# Patient Record
Sex: Female | Born: 1965 | Race: White | Hispanic: No | Smoking: Never smoker
Health system: Southern US, Community
[De-identification: ages and names within clinical notes are randomized; demographics above are authoritative.]

## PROBLEM LIST (undated history)

## (undated) DIAGNOSIS — M545 Low back pain, unspecified: Secondary | ICD-10-CM

## (undated) DIAGNOSIS — G43909 Migraine, unspecified, not intractable, without status migrainosus: Secondary | ICD-10-CM

## (undated) DIAGNOSIS — G4733 Obstructive sleep apnea (adult) (pediatric): Secondary | ICD-10-CM

## (undated) DIAGNOSIS — M1990 Unspecified osteoarthritis, unspecified site: Secondary | ICD-10-CM

## (undated) DIAGNOSIS — T4145XA Adverse effect of unspecified anesthetic, initial encounter: Secondary | ICD-10-CM

## (undated) DIAGNOSIS — I350 Nonrheumatic aortic (valve) stenosis: Secondary | ICD-10-CM

## (undated) DIAGNOSIS — I10 Essential (primary) hypertension: Secondary | ICD-10-CM

## (undated) DIAGNOSIS — J45909 Unspecified asthma, uncomplicated: Secondary | ICD-10-CM

## (undated) DIAGNOSIS — G8929 Other chronic pain: Secondary | ICD-10-CM

## (undated) DIAGNOSIS — J189 Pneumonia, unspecified organism: Secondary | ICD-10-CM

## (undated) DIAGNOSIS — C539 Malignant neoplasm of cervix uteri, unspecified: Secondary | ICD-10-CM

## (undated) DIAGNOSIS — E039 Hypothyroidism, unspecified: Secondary | ICD-10-CM

## (undated) DIAGNOSIS — E662 Morbid (severe) obesity with alveolar hypoventilation: Secondary | ICD-10-CM

## (undated) DIAGNOSIS — T8859XA Other complications of anesthesia, initial encounter: Secondary | ICD-10-CM

## (undated) DIAGNOSIS — I2510 Atherosclerotic heart disease of native coronary artery without angina pectoris: Secondary | ICD-10-CM

## (undated) DIAGNOSIS — K589 Irritable bowel syndrome without diarrhea: Secondary | ICD-10-CM

## (undated) DIAGNOSIS — E785 Hyperlipidemia, unspecified: Secondary | ICD-10-CM

## (undated) DIAGNOSIS — R011 Cardiac murmur, unspecified: Secondary | ICD-10-CM

## (undated) DIAGNOSIS — B1920 Unspecified viral hepatitis C without hepatic coma: Secondary | ICD-10-CM

## (undated) DIAGNOSIS — Z8739 Personal history of other diseases of the musculoskeletal system and connective tissue: Secondary | ICD-10-CM

## (undated) DIAGNOSIS — E119 Type 2 diabetes mellitus without complications: Secondary | ICD-10-CM

## (undated) DIAGNOSIS — I5030 Unspecified diastolic (congestive) heart failure: Secondary | ICD-10-CM

## (undated) DIAGNOSIS — I509 Heart failure, unspecified: Secondary | ICD-10-CM

## (undated) DIAGNOSIS — Z9981 Dependence on supplemental oxygen: Secondary | ICD-10-CM

## (undated) DIAGNOSIS — J449 Chronic obstructive pulmonary disease, unspecified: Secondary | ICD-10-CM

## (undated) DIAGNOSIS — K219 Gastro-esophageal reflux disease without esophagitis: Secondary | ICD-10-CM

## (undated) HISTORY — DX: Nonrheumatic aortic (valve) stenosis: I35.0

## (undated) HISTORY — DX: Hypothyroidism, unspecified: E03.9

## (undated) HISTORY — PX: CHOLECYSTECTOMY OPEN: SUR202

## (undated) HISTORY — PX: ABDOMINAL SURGERY: SHX537

## (undated) HISTORY — DX: Chronic obstructive pulmonary disease, unspecified: J44.9

## (undated) HISTORY — DX: Hyperlipidemia, unspecified: E78.5

## (undated) HISTORY — DX: Unspecified diastolic (congestive) heart failure: I50.30

## (undated) HISTORY — DX: Essential (primary) hypertension: I10

## (undated) HISTORY — DX: Morbid (severe) obesity with alveolar hypoventilation: E66.2

## (undated) HISTORY — DX: Gastro-esophageal reflux disease without esophagitis: K21.9

## (undated) HISTORY — PX: TONSILLECTOMY: SUR1361

## (undated) HISTORY — DX: Malignant neoplasm of cervix uteri, unspecified: C53.9

---

## 2004-10-01 DIAGNOSIS — C539 Malignant neoplasm of cervix uteri, unspecified: Secondary | ICD-10-CM

## 2004-10-01 HISTORY — PX: TOTAL ABDOMINAL HYSTERECTOMY: SHX209

## 2004-10-01 HISTORY — DX: Malignant neoplasm of cervix uteri, unspecified: C53.9

## 2010-10-06 ENCOUNTER — Inpatient Hospital Stay
Admission: AD | Admit: 2010-10-06 | Discharge: 2010-11-06 | Disposition: A | Discharge status: Home / Self Care | Payer: Self-pay | Source: Other Acute Inpatient Hospital | Attending: Internal Medicine | Admitting: Internal Medicine

## 2010-10-06 ENCOUNTER — Encounter: Payer: Self-pay | Admitting: Cardiology

## 2010-10-06 ENCOUNTER — Encounter: Payer: Self-pay | Admitting: Pulmonary Disease

## 2010-10-09 ENCOUNTER — Encounter (INDEPENDENT_AMBULATORY_CARE_PROVIDER_SITE_OTHER): Payer: Self-pay | Admitting: Internal Medicine

## 2010-10-16 LAB — CULTURE, BLOOD (ROUTINE X 2)
Culture  Setup Time: 201201071423
Culture  Setup Time: 201201071423
Culture: NO GROWTH
Culture: NO GROWTH

## 2010-10-16 LAB — BLOOD GAS, ARTERIAL
Acid-Base Excess: 11.9 mmol/L — ABNORMAL HIGH (ref 0.0–2.0)
Acid-Base Excess: 10.9 mmol/L — ABNORMAL HIGH (ref 0.0–2.0)
Acid-Base Excess: 11.5 mmol/L — ABNORMAL HIGH (ref 0.0–2.0)
Acid-Base Excess: 11.1 mmol/L — ABNORMAL HIGH (ref 0.0–2.0)
Acid-Base Excess: 10.3 mmol/L — ABNORMAL HIGH (ref 0.0–2.0)
Bicarbonate: 37.1 mEq/L — ABNORMAL HIGH (ref 20.0–24.0)
Bicarbonate: 35.2 mEq/L — ABNORMAL HIGH (ref 20.0–24.0)
Bicarbonate: 36.6 mEq/L — ABNORMAL HIGH (ref 20.0–24.0)
Bicarbonate: 36.7 mEq/L — ABNORMAL HIGH (ref 20.0–24.0)
Bicarbonate: 35.6 mEq/L — ABNORMAL HIGH (ref 20.0–24.0)
Delivery systems: POSITIVE
Delivery systems: POSITIVE
Delivery systems: POSITIVE
Expiratory PAP: 15
Expiratory PAP: 14
Expiratory PAP: 16
FIO2: 1 %
FIO2: 0.8 %
FIO2: 100 %
FIO2: 0.8 %
FIO2: 0.6 %
Inspiratory PAP: 22
Inspiratory PAP: 20
Inspiratory PAP: 22
O2 Saturation: 95.3 %
O2 Saturation: 93.6 %
O2 Saturation: 93.7 %
O2 Saturation: 96.1 %
O2 Saturation: 94.2 %
Patient temperature: 98.6
Patient temperature: 98.6
Patient temperature: 98.6
Patient temperature: 98.6
Patient temperature: 99
RATE: 10 resp/min
RATE: 20 resp/min
TCO2: 39 mmol/L (ref 0–100)
TCO2: 36.7 mmol/L (ref 0–100)
TCO2: 38.3 mmol/L (ref 0–100)
TCO2: 38.7 mmol/L (ref 0–100)
TCO2: 37.5 mmol/L (ref 0–100)
pCO2 arterial: 60.7 mmHg (ref 35.0–45.0)
pCO2 arterial: 48.7 mmHg — ABNORMAL HIGH (ref 35.0–45.0)
pCO2 arterial: 57.9 mmHg (ref 35.0–45.0)
pCO2 arterial: 65.3 mmHg (ref 35.0–45.0)
pCO2 arterial: 61.3 mmHg (ref 35.0–45.0)
pH, Arterial: 7.403 — ABNORMAL HIGH (ref 7.350–7.400)
pH, Arterial: 7.472 — ABNORMAL HIGH (ref 7.350–7.400)
pH, Arterial: 7.417 — ABNORMAL HIGH (ref 7.350–7.400)
pH, Arterial: 7.368 (ref 7.350–7.400)
pH, Arterial: 7.383 (ref 7.350–7.400)
pO2, Arterial: 78.5 mmHg — ABNORMAL LOW (ref 80.0–100.0)
pO2, Arterial: 66.3 mmHg — ABNORMAL LOW (ref 80.0–100.0)
pO2, Arterial: 70.1 mmHg — ABNORMAL LOW (ref 80.0–100.0)
pO2, Arterial: 90 mmHg (ref 80.0–100.0)
pO2, Arterial: 77.8 mmHg — ABNORMAL LOW (ref 80.0–100.0)

## 2010-10-16 LAB — CBC
HCT: 33.5 % — ABNORMAL LOW (ref 36.0–46.0)
HCT: 35.2 % — ABNORMAL LOW (ref 36.0–46.0)
HCT: 36.2 % (ref 36.0–46.0)
HCT: 38.7 % (ref 36.0–46.0)
HCT: 40.6 % (ref 36.0–46.0)
HCT: 39.7 % (ref 36.0–46.0)
HCT: 39.3 % (ref 36.0–46.0)
HCT: 37.7 % (ref 36.0–46.0)
HCT: 38.4 % (ref 36.0–46.0)
Hemoglobin: 9.7 g/dL — ABNORMAL LOW (ref 12.0–15.0)
Hemoglobin: 10.6 g/dL — ABNORMAL LOW (ref 12.0–15.0)
Hemoglobin: 11 g/dL — ABNORMAL LOW (ref 12.0–15.0)
Hemoglobin: 11.8 g/dL — ABNORMAL LOW (ref 12.0–15.0)
Hemoglobin: 12.8 g/dL (ref 12.0–15.0)
Hemoglobin: 12.4 g/dL (ref 12.0–15.0)
Hemoglobin: 12.1 g/dL (ref 12.0–15.0)
Hemoglobin: 11.8 g/dL — ABNORMAL LOW (ref 12.0–15.0)
Hemoglobin: 12 g/dL (ref 12.0–15.0)
MCH: 28.6 pg (ref 26.0–34.0)
MCH: 28.3 pg (ref 26.0–34.0)
MCH: 29.1 pg (ref 26.0–34.0)
MCH: 29.1 pg (ref 26.0–34.0)
MCH: 29.7 pg (ref 26.0–34.0)
MCH: 29.2 pg (ref 26.0–34.0)
MCH: 29 pg (ref 26.0–34.0)
MCH: 29.4 pg (ref 26.0–34.0)
MCH: 29.3 pg (ref 26.0–34.0)
MCHC: 29 g/dL — ABNORMAL LOW (ref 30.0–36.0)
MCHC: 30.1 g/dL (ref 30.0–36.0)
MCHC: 30.4 g/dL (ref 30.0–36.0)
MCHC: 30.5 g/dL (ref 30.0–36.0)
MCHC: 31.5 g/dL (ref 30.0–36.0)
MCHC: 31.2 g/dL (ref 30.0–36.0)
MCHC: 30.8 g/dL (ref 30.0–36.0)
MCHC: 31.3 g/dL (ref 30.0–36.0)
MCHC: 31.3 g/dL (ref 30.0–36.0)
MCV: 98.8 fL (ref 78.0–100.0)
MCV: 94.1 fL (ref 78.0–100.0)
MCV: 95.8 fL (ref 78.0–100.0)
MCV: 95.6 fL (ref 78.0–100.0)
MCV: 94.2 fL (ref 78.0–100.0)
MCV: 93.4 fL (ref 78.0–100.0)
MCV: 94.2 fL (ref 78.0–100.0)
MCV: 93.8 fL (ref 78.0–100.0)
MCV: 93.9 fL (ref 78.0–100.0)
Platelets: 216 10*3/uL (ref 150–400)
Platelets: 256 10*3/uL (ref 150–400)
Platelets: 291 10*3/uL (ref 150–400)
Platelets: 334 10*3/uL (ref 150–400)
Platelets: 334 10*3/uL (ref 150–400)
Platelets: 362 10*3/uL (ref 150–400)
Platelets: 369 10*3/uL (ref 150–400)
Platelets: 359 10*3/uL (ref 150–400)
Platelets: 427 10*3/uL — ABNORMAL HIGH (ref 150–400)
RBC: 3.39 MIL/uL — ABNORMAL LOW (ref 3.87–5.11)
RBC: 3.74 MIL/uL — ABNORMAL LOW (ref 3.87–5.11)
RBC: 3.78 MIL/uL — ABNORMAL LOW (ref 3.87–5.11)
RBC: 4.05 MIL/uL (ref 3.87–5.11)
RBC: 4.31 MIL/uL (ref 3.87–5.11)
RBC: 4.25 MIL/uL (ref 3.87–5.11)
RBC: 4.17 MIL/uL (ref 3.87–5.11)
RBC: 4.02 MIL/uL (ref 3.87–5.11)
RBC: 4.09 MIL/uL (ref 3.87–5.11)
RDW: 13.7 % (ref 11.5–15.5)
RDW: 13.3 % (ref 11.5–15.5)
RDW: 13.7 % (ref 11.5–15.5)
RDW: 13.7 % (ref 11.5–15.5)
RDW: 13.5 % (ref 11.5–15.5)
RDW: 13.6 % (ref 11.5–15.5)
RDW: 13.8 % (ref 11.5–15.5)
RDW: 13.9 % (ref 11.5–15.5)
RDW: 14 % (ref 11.5–15.5)
WBC: 10.2 10*3/uL (ref 4.0–10.5)
WBC: 9.4 10*3/uL (ref 4.0–10.5)
WBC: 14.1 10*3/uL — ABNORMAL HIGH (ref 4.0–10.5)
WBC: 12.3 10*3/uL — ABNORMAL HIGH (ref 4.0–10.5)
WBC: 13.5 10*3/uL — ABNORMAL HIGH (ref 4.0–10.5)
WBC: 14.3 10*3/uL — ABNORMAL HIGH (ref 4.0–10.5)
WBC: 14.4 10*3/uL — ABNORMAL HIGH (ref 4.0–10.5)
WBC: 19.3 10*3/uL — ABNORMAL HIGH (ref 4.0–10.5)
WBC: 22.1 10*3/uL — ABNORMAL HIGH (ref 4.0–10.5)

## 2010-10-16 LAB — COMPREHENSIVE METABOLIC PANEL
ALT: 17 U/L (ref 0–35)
AST: 26 U/L (ref 0–37)
Albumin: 2.4 g/dL — ABNORMAL LOW (ref 3.5–5.2)
Alkaline Phosphatase: 74 U/L (ref 39–117)
BUN: 10 mg/dL (ref 6–23)
CO2: 35 mEq/L — ABNORMAL HIGH (ref 19–32)
Calcium: 9.1 mg/dL (ref 8.4–10.5)
Chloride: 95 mEq/L — ABNORMAL LOW (ref 96–112)
Creatinine, Ser: 0.71 mg/dL (ref 0.4–1.2)
GFR calc Af Amer: >60 mL/min (ref >60)
GFR calc non Af Amer: >60 mL/min (ref >60)
Glucose, Bld: 97 mg/dL (ref 70–99)
Potassium: 4.1 mEq/L (ref 3.5–5.1)
Sodium: 141 mEq/L (ref 135–145)
Total Bilirubin: 0.9 mg/dL (ref 0.3–1.2)
Total Protein: 7 g/dL (ref 6.0–8.3)

## 2010-10-16 LAB — CULTURE, RESPIRATORY W GRAM STAIN
Culture: NORMAL
Gram Stain: NONE SEEN

## 2010-10-16 LAB — URINALYSIS, ROUTINE W REFLEX MICROSCOPIC
Bilirubin Urine: NEGATIVE
Bilirubin Urine: NEGATIVE
Ketones, ur: NEGATIVE mg/dL
Ketones, ur: NEGATIVE mg/dL
Leukocytes, UA: NEGATIVE
Nitrite: NEGATIVE
Nitrite: NEGATIVE
Protein, ur: NEGATIVE mg/dL
Protein, ur: NEGATIVE mg/dL
Specific Gravity, Urine: 1.01 (ref 1.005–1.030)
Specific Gravity, Urine: 1.011 (ref 1.005–1.030)
Urine Glucose, Fasting: NEGATIVE mg/dL
Urine Glucose, Fasting: NEGATIVE mg/dL
Urobilinogen, UA: 0.2 mg/dL (ref 0.0–1.0)
Urobilinogen, UA: 0.2 mg/dL (ref 0.0–1.0)
pH: 7.5 (ref 5.0–8.0)
pH: 7 (ref 5.0–8.0)

## 2010-10-16 LAB — EXPECTORATED SPUTUM ASSESSMENT W GRAM STAIN, RFLX TO RESP C

## 2010-10-16 LAB — BASIC METABOLIC PANEL
BUN: 14 mg/dL (ref 6–23)
BUN: 19 mg/dL (ref 6–23)
BUN: 29 mg/dL — ABNORMAL HIGH (ref 6–23)
BUN: 33 mg/dL — ABNORMAL HIGH (ref 6–23)
BUN: 38 mg/dL — ABNORMAL HIGH (ref 6–23)
BUN: 35 mg/dL — ABNORMAL HIGH (ref 6–23)
BUN: 30 mg/dL — ABNORMAL HIGH (ref 6–23)
BUN: 36 mg/dL — ABNORMAL HIGH (ref 6–23)
CO2: 36 mEq/L — ABNORMAL HIGH (ref 19–32)
CO2: 36 mEq/L — ABNORMAL HIGH (ref 19–32)
CO2: 36 mEq/L — ABNORMAL HIGH (ref 19–32)
CO2: 35 mEq/L — ABNORMAL HIGH (ref 19–32)
CO2: 32 mEq/L (ref 19–32)
CO2: 31 mEq/L (ref 19–32)
CO2: 33 mEq/L — ABNORMAL HIGH (ref 19–32)
CO2: 35 mEq/L — ABNORMAL HIGH (ref 19–32)
Calcium: 9.6 mg/dL (ref 8.4–10.5)
Calcium: 9.2 mg/dL (ref 8.4–10.5)
Calcium: 9.3 mg/dL (ref 8.4–10.5)
Calcium: 9.3 mg/dL (ref 8.4–10.5)
Calcium: 8.8 mg/dL (ref 8.4–10.5)
Calcium: 8.7 mg/dL (ref 8.4–10.5)
Calcium: 9 mg/dL (ref 8.4–10.5)
Calcium: 8.9 mg/dL (ref 8.4–10.5)
Chloride: 96 mEq/L (ref 96–112)
Chloride: 97 mEq/L (ref 96–112)
Chloride: 93 mEq/L — ABNORMAL LOW (ref 96–112)
Chloride: 86 mEq/L — ABNORMAL LOW (ref 96–112)
Chloride: 91 mEq/L — ABNORMAL LOW (ref 96–112)
Chloride: 95 mEq/L — ABNORMAL LOW (ref 96–112)
Chloride: 92 mEq/L — ABNORMAL LOW (ref 96–112)
Chloride: 93 mEq/L — ABNORMAL LOW (ref 96–112)
Creatinine, Ser: 0.73 mg/dL (ref 0.4–1.2)
Creatinine, Ser: 0.72 mg/dL (ref 0.4–1.2)
Creatinine, Ser: 0.74 mg/dL (ref 0.4–1.2)
Creatinine, Ser: 0.78 mg/dL (ref 0.4–1.2)
Creatinine, Ser: 0.89 mg/dL (ref 0.4–1.2)
Creatinine, Ser: 0.68 mg/dL (ref 0.4–1.2)
Creatinine, Ser: 0.65 mg/dL (ref 0.4–1.2)
Creatinine, Ser: 0.72 mg/dL (ref 0.4–1.2)
GFR calc Af Amer: >60 mL/min (ref >60)
GFR calc Af Amer: >60 mL/min (ref >60)
GFR calc Af Amer: >60 mL/min (ref >60)
GFR calc Af Amer: >60 mL/min (ref >60)
GFR calc Af Amer: >60 mL/min (ref >60)
GFR calc Af Amer: >60 mL/min (ref >60)
GFR calc Af Amer: >60 mL/min (ref >60)
GFR calc Af Amer: >60 mL/min (ref >60)
GFR calc non Af Amer: >60 mL/min (ref >60)
GFR calc non Af Amer: >60 mL/min (ref >60)
GFR calc non Af Amer: >60 mL/min (ref >60)
GFR calc non Af Amer: >60 mL/min (ref >60)
GFR calc non Af Amer: >60 mL/min (ref >60)
GFR calc non Af Amer: >60 mL/min (ref >60)
GFR calc non Af Amer: >60 mL/min (ref >60)
GFR calc non Af Amer: >60 mL/min (ref >60)
Glucose, Bld: 207 mg/dL — ABNORMAL HIGH (ref 70–99)
Glucose, Bld: 186 mg/dL — ABNORMAL HIGH (ref 70–99)
Glucose, Bld: 139 mg/dL — ABNORMAL HIGH (ref 70–99)
Glucose, Bld: 217 mg/dL — ABNORMAL HIGH (ref 70–99)
Glucose, Bld: 276 mg/dL — ABNORMAL HIGH (ref 70–99)
Glucose, Bld: 242 mg/dL — ABNORMAL HIGH (ref 70–99)
Glucose, Bld: 142 mg/dL — ABNORMAL HIGH (ref 70–99)
Glucose, Bld: 167 mg/dL — ABNORMAL HIGH (ref 70–99)
Potassium: 4.4 mEq/L (ref 3.5–5.1)
Potassium: 4 mEq/L (ref 3.5–5.1)
Potassium: 4.4 mEq/L (ref 3.5–5.1)
Potassium: 4 mEq/L (ref 3.5–5.1)
Potassium: 4.6 mEq/L (ref 3.5–5.1)
Potassium: 4.4 mEq/L (ref 3.5–5.1)
Potassium: 3.7 mEq/L (ref 3.5–5.1)
Potassium: 3.6 mEq/L (ref 3.5–5.1)
Sodium: 143 mEq/L (ref 135–145)
Sodium: 141 mEq/L (ref 135–145)
Sodium: 140 mEq/L (ref 135–145)
Sodium: 133 mEq/L — ABNORMAL LOW (ref 135–145)
Sodium: 133 mEq/L — ABNORMAL LOW (ref 135–145)
Sodium: 136 mEq/L (ref 135–145)
Sodium: 135 mEq/L (ref 135–145)
Sodium: 139 mEq/L (ref 135–145)

## 2010-10-16 LAB — URINE MICROSCOPIC-ADD ON

## 2010-10-16 LAB — DIFFERENTIAL
Basophils Absolute: 0.2 10*3/uL — ABNORMAL HIGH (ref 0.0–0.1)
Basophils Relative: 1 % (ref 0–1)
Eosinophils Absolute: 0.2 10*3/uL (ref 0.0–0.7)
Eosinophils Relative: 1 % (ref 0–5)
Lymphocytes Relative: 31 % (ref 12–46)
Lymphs Abs: 6.9 10*3/uL — ABNORMAL HIGH (ref 0.7–4.0)
Monocytes Absolute: 2 10*3/uL — ABNORMAL HIGH (ref 0.1–1.0)
Monocytes Relative: 9 % (ref 3–12)
Neutro Abs: 12.8 10*3/uL — ABNORMAL HIGH (ref 1.7–7.7)
Neutrophils Relative %: 58 % (ref 43–77)

## 2010-10-16 LAB — VANCOMYCIN, TROUGH
Vancomycin Tr: 18 ug/mL (ref 10.0–20.0)
Vancomycin Tr: 28.4 ug/mL (ref 10.0–20.0)

## 2010-10-16 LAB — CLOSTRIDIUM DIFFICILE BY PCR: Toxigenic C. Difficile by PCR: NEGATIVE

## 2010-10-16 LAB — PROTIME-INR
INR: 1.08 (ref 0.00–1.49)
INR: 1.08 (ref 0.00–1.49)
Prothrombin Time: 14.2 seconds (ref 11.6–15.2)
Prothrombin Time: 14.2 seconds (ref 11.6–15.2)

## 2010-10-16 LAB — URINE CULTURE
Colony Count: NO GROWTH
Culture  Setup Time: 201201062003
Culture: NO GROWTH

## 2010-10-16 LAB — VITAMIN D 25 HYDROXY (VIT D DEFICIENCY, FRACTURES): Vit D, 25-Hydroxy: 24 ng/mL — ABNORMAL LOW (ref 30–89)

## 2010-10-16 LAB — BRAIN NATRIURETIC PEPTIDE: Pro B Natriuretic peptide (BNP): 74 pg/mL (ref 0.0–100.0)

## 2010-10-16 LAB — PREALBUMIN: Prealbumin: 12.3 mg/dL — ABNORMAL LOW (ref 17.0–34.0)

## 2010-10-16 LAB — PHOSPHORUS: Phosphorus: 5.1 mg/dL — ABNORMAL HIGH (ref 2.3–4.6)

## 2010-10-16 LAB — CULTURE, RESPIRATORY: Culture: NORMAL

## 2010-10-16 LAB — PROCALCITONIN: Procalcitonin: <0.1 ng/mL

## 2010-10-16 LAB — MAGNESIUM
Magnesium: 2.6 mg/dL — ABNORMAL HIGH (ref 1.5–2.5)
Magnesium: 2.7 mg/dL — ABNORMAL HIGH (ref 1.5–2.5)
Magnesium: 2.4 mg/dL (ref 1.5–2.5)

## 2010-10-16 LAB — VANCOMYCIN, RANDOM: Vancomycin Rm: 12.7 ug/mL

## 2010-10-16 LAB — EXPECTORATED SPUTUM ASSESSMENT W REFEX TO RESP CULTURE

## 2010-10-16 LAB — TSH: TSH: 0.396 u[IU]/mL (ref 0.350–4.500)

## 2010-10-18 LAB — PROTIME-INR
INR: 1.12 (ref 0.00–1.49)
Prothrombin Time: 14.6 seconds (ref 11.6–15.2)

## 2010-10-18 LAB — DIFFERENTIAL
Basophils Absolute: 0 10*3/uL (ref 0.0–0.1)
Basophils Relative: 0 % (ref 0–1)
Eosinophils Absolute: 0.2 10*3/uL (ref 0.0–0.7)
Eosinophils Relative: 1 % (ref 0–5)
Lymphocytes Relative: 29 % (ref 12–46)
Lymphs Abs: 5.4 10*3/uL — ABNORMAL HIGH (ref 0.7–4.0)
Monocytes Absolute: 1.5 10*3/uL — ABNORMAL HIGH (ref 0.1–1.0)
Monocytes Relative: 8 % (ref 3–12)
Neutro Abs: 11.4 10*3/uL — ABNORMAL HIGH (ref 1.7–7.7)
Neutrophils Relative %: 62 % (ref 43–77)

## 2010-10-18 LAB — BASIC METABOLIC PANEL
BUN: 32 mg/dL — ABNORMAL HIGH (ref 6–23)
CO2: 34 mEq/L — ABNORMAL HIGH (ref 19–32)
Calcium: 9 mg/dL (ref 8.4–10.5)
Chloride: 88 mEq/L — ABNORMAL LOW (ref 96–112)
Creatinine, Ser: 0.6 mg/dL (ref 0.4–1.2)
GFR calc Af Amer: >60 mL/min (ref >60)
GFR calc non Af Amer: >60 mL/min (ref >60)
Glucose, Bld: 111 mg/dL — ABNORMAL HIGH (ref 70–99)
Potassium: 4.1 mEq/L (ref 3.5–5.1)
Sodium: 132 mEq/L — ABNORMAL LOW (ref 135–145)

## 2010-10-18 LAB — CBC
HCT: 39.1 % (ref 36.0–46.0)
Hemoglobin: 12.3 g/dL (ref 12.0–15.0)
MCH: 29.2 pg (ref 26.0–34.0)
MCHC: 31.5 g/dL (ref 30.0–36.0)
MCV: 92.9 fL (ref 78.0–100.0)
Platelets: 415 10*3/uL — ABNORMAL HIGH (ref 150–400)
RBC: 4.21 MIL/uL (ref 3.87–5.11)
RDW: 14 % (ref 11.5–15.5)
WBC: 18.5 10*3/uL — ABNORMAL HIGH (ref 4.0–10.5)

## 2010-10-18 LAB — CLOSTRIDIUM DIFFICILE BY PCR: Toxigenic C. Difficile by PCR: NEGATIVE

## 2010-10-18 LAB — VANCOMYCIN, TROUGH: Vancomycin Tr: 10.5 ug/mL (ref 10.0–20.0)

## 2010-10-18 LAB — URINE CULTURE
Colony Count: NO GROWTH
Culture  Setup Time: 201201151741
Culture: NO GROWTH

## 2010-10-23 LAB — BASIC METABOLIC PANEL
BUN: 19 mg/dL (ref 6–23)
CO2: 36 mEq/L — ABNORMAL HIGH (ref 19–32)
Calcium: 9 mg/dL (ref 8.4–10.5)
Chloride: 90 mEq/L — ABNORMAL LOW (ref 96–112)
Creatinine, Ser: 0.62 mg/dL (ref 0.4–1.2)
GFR calc Af Amer: >60 mL/min (ref >60)
GFR calc non Af Amer: >60 mL/min (ref >60)
Glucose, Bld: 115 mg/dL — ABNORMAL HIGH (ref 70–99)
Potassium: 4.2 mEq/L (ref 3.5–5.1)
Sodium: 136 mEq/L (ref 135–145)

## 2010-10-23 LAB — CBC
HCT: 37.6 % (ref 36.0–46.0)
Hemoglobin: 11.6 g/dL — ABNORMAL LOW (ref 12.0–15.0)
MCH: 28.2 pg (ref 26.0–34.0)
MCHC: 30.9 g/dL (ref 30.0–36.0)
MCV: 91.5 fL (ref 78.0–100.0)
Platelets: 328 10*3/uL (ref 150–400)
RBC: 4.11 MIL/uL (ref 3.87–5.11)
RDW: 14 % (ref 11.5–15.5)
WBC: 16.6 10*3/uL — ABNORMAL HIGH (ref 4.0–10.5)

## 2010-10-24 LAB — BASIC METABOLIC PANEL
CO2: 33 mEq/L — ABNORMAL HIGH (ref 19–32)
Calcium: 9 mg/dL (ref 8.4–10.5)
Chloride: 92 mEq/L — ABNORMAL LOW (ref 96–112)
Creatinine, Ser: 0.6 mg/dL (ref 0.4–1.2)
GFR calc Af Amer: >60 mL/min (ref >60)
GFR calc non Af Amer: >60 mL/min (ref >60)
Glucose, Bld: 129 mg/dL — ABNORMAL HIGH (ref 70–99)
Potassium: 3.4 mEq/L — ABNORMAL LOW (ref 3.5–5.1)
Sodium: 136 mEq/L (ref 135–145)

## 2010-10-24 LAB — CULTURE, BLOOD (ROUTINE X 2)
Culture  Setup Time: 201201152100
Culture: NO GROWTH
Culture: NO GROWTH

## 2010-10-24 LAB — MAGNESIUM: Magnesium: 2.2 mg/dL (ref 1.5–2.5)

## 2010-10-24 LAB — CALCIUM, IONIZED: Calcium, Ion: 1.16 mmol/L (ref 1.12–1.32)

## 2010-10-25 LAB — BASIC METABOLIC PANEL
BUN: 15 mg/dL (ref 6–23)
CO2: 30 mEq/L (ref 19–32)
Calcium: 8.5 mg/dL (ref 8.4–10.5)
Chloride: 96 mEq/L (ref 96–112)
Creatinine, Ser: 0.51 mg/dL (ref 0.4–1.2)
GFR calc Af Amer: >60 mL/min (ref >60)
GFR calc non Af Amer: >60 mL/min (ref >60)
Glucose, Bld: 125 mg/dL — ABNORMAL HIGH (ref 70–99)
Potassium: 3.2 mEq/L — ABNORMAL LOW (ref 3.5–5.1)
Sodium: 136 mEq/L (ref 135–145)

## 2010-10-26 LAB — BASIC METABOLIC PANEL
BUN: 14 mg/dL (ref 6–23)
CO2: 31 mEq/L (ref 19–32)
Calcium: 8.9 mg/dL (ref 8.4–10.5)
Chloride: 93 mEq/L — ABNORMAL LOW (ref 96–112)
Creatinine, Ser: 0.74 mg/dL (ref 0.4–1.2)
GFR calc Af Amer: >60 mL/min (ref >60)
GFR calc non Af Amer: >60 mL/min (ref >60)
Glucose, Bld: 209 mg/dL — ABNORMAL HIGH (ref 70–99)
Potassium: 4.1 mEq/L (ref 3.5–5.1)
Sodium: 134 mEq/L — ABNORMAL LOW (ref 135–145)

## 2010-10-26 LAB — PROTIME-INR
INR: 0.99 (ref 0.00–1.49)
Prothrombin Time: 13.3 seconds (ref 11.6–15.2)

## 2010-10-26 LAB — MAGNESIUM: Magnesium: 2.1 mg/dL (ref 1.5–2.5)

## 2010-10-27 LAB — BASIC METABOLIC PANEL
BUN: 16 mg/dL (ref 6–23)
CO2: 28 mEq/L (ref 19–32)
Calcium: 9.1 mg/dL (ref 8.4–10.5)
Chloride: 94 mEq/L — ABNORMAL LOW (ref 96–112)
Creatinine, Ser: 0.66 mg/dL (ref 0.4–1.2)
GFR calc Af Amer: >60 mL/min (ref >60)
GFR calc non Af Amer: >60 mL/min (ref >60)
Glucose, Bld: 149 mg/dL — ABNORMAL HIGH (ref 70–99)
Potassium: 4.7 mEq/L (ref 3.5–5.1)
Sodium: 135 mEq/L (ref 135–145)

## 2010-10-27 LAB — MAGNESIUM: Magnesium: 2.3 mg/dL (ref 1.5–2.5)

## 2010-10-29 LAB — BASIC METABOLIC PANEL
BUN: 17 mg/dL (ref 6–23)
CO2: 28 mEq/L (ref 19–32)
Calcium: 9.1 mg/dL (ref 8.4–10.5)
Chloride: 92 mEq/L — ABNORMAL LOW (ref 96–112)
Creatinine, Ser: 0.6 mg/dL (ref 0.4–1.2)
GFR calc Af Amer: >60 mL/min (ref >60)
GFR calc non Af Amer: >60 mL/min (ref >60)
Glucose, Bld: 156 mg/dL — ABNORMAL HIGH (ref 70–99)
Potassium: 3.4 mEq/L — ABNORMAL LOW (ref 3.5–5.1)
Sodium: 133 mEq/L — ABNORMAL LOW (ref 135–145)

## 2010-10-29 LAB — CBC
HCT: 31.1 % — ABNORMAL LOW (ref 36.0–46.0)
Hemoglobin: 9.8 g/dL — ABNORMAL LOW (ref 12.0–15.0)
MCH: 28.7 pg (ref 26.0–34.0)
MCHC: 31.5 g/dL (ref 30.0–36.0)
MCV: 90.9 fL (ref 78.0–100.0)
Platelets: 146 10*3/uL — ABNORMAL LOW (ref 150–400)
RBC: 3.42 MIL/uL — ABNORMAL LOW (ref 3.87–5.11)
RDW: 14.5 % (ref 11.5–15.5)
WBC: 8.5 10*3/uL (ref 4.0–10.5)

## 2010-10-29 LAB — MAGNESIUM: Magnesium: 2 mg/dL (ref 1.5–2.5)

## 2010-10-30 LAB — BASIC METABOLIC PANEL
BUN: 15 mg/dL (ref 6–23)
Calcium: 9.1 mg/dL (ref 8.4–10.5)
Chloride: 95 mEq/L — ABNORMAL LOW (ref 96–112)
Creatinine, Ser: 0.6 mg/dL (ref 0.4–1.2)
GFR calc non Af Amer: >60 mL/min (ref >60)
Potassium: 3.6 mEq/L (ref 3.5–5.1)
Sodium: 135 mEq/L (ref 135–145)

## 2010-10-30 LAB — CBC
HCT: 31 % — ABNORMAL LOW (ref 36.0–46.0)
Hemoglobin: 9.8 g/dL — ABNORMAL LOW (ref 12.0–15.0)
MCHC: 31.6 g/dL (ref 30.0–36.0)
MCV: 90.9 fL (ref 78.0–100.0)
Platelets: 143 10*3/uL — ABNORMAL LOW (ref 150–400)
RBC: 3.41 MIL/uL — ABNORMAL LOW (ref 3.87–5.11)
RDW: 14.6 % (ref 11.5–15.5)
WBC: 8.2 10*3/uL (ref 4.0–10.5)

## 2010-11-01 LAB — HEPARIN INDUCED THROMBOCYTOPENIA PNL
Heparin Induced Plt Ab: NEGATIVE
Patient O.D.: 0.246
UFH Low Dose 0.1 IU/mL: 0 % Release
UFH Low Dose 0.5 IU/mL: 0 % Release

## 2010-11-02 LAB — POTASSIUM: Potassium: 3.8 mEq/L (ref 3.5–5.1)

## 2010-11-05 LAB — BASIC METABOLIC PANEL
CO2: 31 mEq/L (ref 19–32)
Calcium: 9.1 mg/dL (ref 8.4–10.5)
Creatinine, Ser: 0.72 mg/dL (ref 0.4–1.2)
GFR calc Af Amer: >60 mL/min (ref >60)
GFR calc non Af Amer: >60 mL/min (ref >60)
Sodium: 138 mEq/L (ref 135–145)

## 2010-11-10 DIAGNOSIS — E0590 Thyrotoxicosis, unspecified without thyrotoxic crisis or storm: Secondary | ICD-10-CM | POA: Insufficient documentation

## 2010-11-10 DIAGNOSIS — G4733 Obstructive sleep apnea (adult) (pediatric): Secondary | ICD-10-CM | POA: Insufficient documentation

## 2010-11-10 DIAGNOSIS — E1142 Type 2 diabetes mellitus with diabetic polyneuropathy: Secondary | ICD-10-CM | POA: Insufficient documentation

## 2010-11-10 DIAGNOSIS — I10 Essential (primary) hypertension: Secondary | ICD-10-CM | POA: Insufficient documentation

## 2010-11-10 DIAGNOSIS — E119 Type 2 diabetes mellitus without complications: Secondary | ICD-10-CM | POA: Insufficient documentation

## 2010-11-10 DIAGNOSIS — K219 Gastro-esophageal reflux disease without esophagitis: Secondary | ICD-10-CM | POA: Insufficient documentation

## 2010-11-10 DIAGNOSIS — E870 Hyperosmolality and hypernatremia: Secondary | ICD-10-CM | POA: Insufficient documentation

## 2010-11-10 DIAGNOSIS — E1169 Type 2 diabetes mellitus with other specified complication: Secondary | ICD-10-CM | POA: Insufficient documentation

## 2010-11-10 HISTORY — DX: Obstructive sleep apnea (adult) (pediatric): G47.33

## 2010-11-10 HISTORY — DX: Essential (primary) hypertension: I10

## 2010-11-10 HISTORY — DX: Type 2 diabetes mellitus without complications: E11.9

## 2010-11-10 HISTORY — DX: Gastro-esophageal reflux disease without esophagitis: K21.9

## 2010-11-13 ENCOUNTER — Encounter: Payer: Self-pay | Admitting: Cardiology

## 2010-11-13 ENCOUNTER — Ambulatory Visit (INDEPENDENT_AMBULATORY_CARE_PROVIDER_SITE_OTHER): Payer: Medicare Other | Admitting: Cardiology

## 2010-11-13 DIAGNOSIS — E039 Hypothyroidism, unspecified: Secondary | ICD-10-CM

## 2010-11-13 DIAGNOSIS — I5040 Unspecified combined systolic (congestive) and diastolic (congestive) heart failure: Secondary | ICD-10-CM | POA: Insufficient documentation

## 2010-11-13 DIAGNOSIS — I5032 Chronic diastolic (congestive) heart failure: Secondary | ICD-10-CM

## 2010-11-13 DIAGNOSIS — I10 Essential (primary) hypertension: Secondary | ICD-10-CM

## 2010-11-13 HISTORY — DX: Hypothyroidism, unspecified: E03.9

## 2010-11-14 ENCOUNTER — Encounter: Payer: Self-pay | Admitting: Cardiology

## 2010-11-14 ENCOUNTER — Other Ambulatory Visit: Payer: Self-pay | Admitting: Cardiology

## 2010-11-14 ENCOUNTER — Other Ambulatory Visit (INDEPENDENT_AMBULATORY_CARE_PROVIDER_SITE_OTHER): Payer: Medicare Other

## 2010-11-14 DIAGNOSIS — I5032 Chronic diastolic (congestive) heart failure: Secondary | ICD-10-CM

## 2010-11-14 DIAGNOSIS — R0609 Other forms of dyspnea: Secondary | ICD-10-CM

## 2010-11-14 DIAGNOSIS — Z79899 Other long term (current) drug therapy: Secondary | ICD-10-CM

## 2010-11-14 HISTORY — DX: Chronic diastolic (congestive) heart failure: I50.32

## 2010-11-15 LAB — BASIC METABOLIC PANEL
BUN: 49 mg/dL — ABNORMAL HIGH (ref 6–23)
Chloride: 93 mEq/L — ABNORMAL LOW (ref 96–112)
GFR: 48.86 mL/min — ABNORMAL LOW (ref >60.00)
Potassium: 4.2 mEq/L (ref 3.5–5.1)

## 2010-11-15 LAB — BRAIN NATRIURETIC PEPTIDE: Pro B Natriuretic peptide (BNP): 84.1 pg/mL (ref 0.0–100.0)

## 2010-11-20 ENCOUNTER — Other Ambulatory Visit: Payer: Self-pay

## 2010-11-22 NOTE — Assessment & Plan Note (Signed)
Summary: np6/chf/medicare,medicard/[phone] per amy from dr. Thedore Mins...   CC:  dizziness.  History of Present Illness: 45 year old female for evaluation of CHF. Patient admitted to [facility] in January with pneumonia and respiratory failure. She was treated with antibiotics and diuretics with improvement. She was transferred to selective medical rehabilitation services at [facility] for rehabilitation. Echocardiogram in January of 2012 showed normal LV function, mild left atrial enlargement and mild aortic insufficiency. Because of the above we were asked to further evaluate. Note she is unclear about her medications but states she is on 3 different diuretics. She does state that her dyspnea on exertion has improved as well as her weakness. There is no orthopnea or PND but has chronic pedal edema. She does not have exertional chest pain or syncope.  Preventive Screening-Counseling & Management  Alcohol-Tobacco     Smoking Status: never  Current Medications (verified): 1)  Advair Diskus 500-50 Mcg/dose Aepb (Fluticasone-Salmeterol) .... As Directed 2)  Aspirin 81 Mg  Tabs (Aspirin) .Marland Kitchen.. 1 Tab By Mouth Once Daily 3)  Celebrex 200 Mg Caps (Celecoxib) .... As Needed 4)  Clonazepam 1 Mg Tabs (Clonazepam) .Marland Kitchen.. 1 Tab By Mouth As Needed 5)  Cpap .... As Directed 6)  Endocet 10-325 Mg Tabs (Oxycodone-Acetaminophen) .... As Needed 7)  Estradiol 2 Mg Tabs (Estradiol) .Marland Kitchen.. 1 Tab By Mouth Once Daily 8)  Fluoxetine Hcl 40 Mg Caps (Fluoxetine Hcl) .Marland Kitchen.. 1 Tab By Mouth Once Daily 9)  Gabapentin 100 Mg Caps (Gabapentin) .... 3 Tas By Mouth At Bedtime 10)  Lantus 100 Unit/ml Soln (Insulin Glargine) .... 65 Units At Bedtime 11)  Levothyroxine Sodium 150 Mcg Tabs (Levothyroxine Sodium) .Marland Kitchen.. 1 Tab By Mouth Once Daily 12)  Lisinopril 10 Mg Tabs (Lisinopril) .... Take One Tablet By Mouth Daily 13)  Omeprazole 20 Mg Cpdr (Omeprazole) .Marland Kitchen.. 1 Tab By Mouth Once Daily 14)  Perforomist 20 Mcg/81ml  Nebu (Formoterol Fumarate) .... As Directed 15)  Temazepam 30 Mg Caps (Temazepam) .Marland Kitchen.. 1 Tab Po Qhs Prn 16)  Torsemide 20 Mg Tabs (Torsemide) .Marland Kitchen.. 1 Tab By Mouth Two Times A Day 17)  Ventolin Hfa 108 (90 Base) Mcg/act Aers (Albuterol Sulfate) .... As Directed  Past History:  Past Medical History: GERD  HYPOTHYROIDISM  HYPERTENSION  OBSTRUCTIVE SLEEP APNEA/OBESITY HYPOVENTILATION SYNDROME; HOME O2 2004 DM HYPERLIPIDEMIA DIASTOLIC CHF CERVICAL CANCER  Past Surgical History: SURGERY FOR CERVICAL CANCER HYSTERECTOMY CHOLECYSTECTOMY TONSILLECTOMY  Family History: Reviewed history and no changes required. FATHER WITH UNKNOWN HEART PROBLEM  Social History: Reviewed history and no changes required. Disabled  Single  Tobacco Use - No.  Alcohol Use - yes Smoking Status:  never  Review of Systems       no fevers or chills, productive cough, hemoptysis, dysphasia, odynophagia, melena, hematochezia, dysuria, hematuria, rash, seizure activity, orthopnea, PND, pedal edema, claudication. Remaining systems are negative.   Vital Signs:  Patient profile:   45 year old female Height:      67 inches Weight:      343 pounds BMI:     53.92 Pulse rate:   86 / minute Resp:     18 per minute BP sitting:   97 / 66  (left arm)  Vitals Entered By: Kem Parkinson (November 13, 2010 4:38 PM)  Physical Exam  General:  Well developed/morbidly obese in NAD Skin warm/dry Patient not depressed No peripheral clubbing Back-normal HEENT-normal/normal eyelids Neck supple/normal carotid upstroke bilaterally; no bruits; no JVD; no thyromegaly chest - CTA/ normal expansion CV -  RRR/normal S1 and S2; no  rubs or gallops;  PMI nondisplaced; 2/6 systolic murmur left sternal border. Abdomen -difficult due to obesity; NT/ND, no HSM, no mass, + bowel sounds, no bruit femoral pulses not palpated secondary to obesity Ext-chronic skin changes and 1+ edema at the ankles. Neuro-grossly  nonfocal     EKG  Procedure date:  11/13/2010  Findings:      Sinus rhythm with no ST changes.  Impression & Recommendations:  Problem # 1:  UNSPEC COMBINED SYSTOLIC&DIASTOLIC HEART FAILURE (ICD-428.40) Patient has a history of diastolic congestive heart failure. She apparently is taking 3 diuretics at home. She does not recall all of her medications. She will contact us and we will adjust her regimen as indicated. She apparently is taking both furosemide and torsemide and what sounds to be metolazone. I will most likely discontinue Lasix if the above is true. Check potassium, renal function and BNP. Her updated medication list for this problem includes:    Aspirin 81 Mg Tabs (Aspirin) .Marland Kitchen... 1 tab by mouth once daily    Lisinopril 10 Mg Tabs (Lisinopril) .Marland Kitchen... Take one tablet by mouth daily    Torsemide 20 Mg Tabs (Torsemide) .Marland Kitchen... 1 tab by mouth two times a day  Problem # 2:  HYPERTENSION (ICD-401.9) Blood pressure controlled on present medications. Will continue. I might discontinue lisinopril in the future if her blood pressure becomes an issue with diuresis. Her updated medication list for this problem includes:    Aspirin 81 Mg Tabs (Aspirin) .Marland Kitchen... 1 tab by mouth once daily    Lisinopril 10 Mg Tabs (Lisinopril) .Marland Kitchen... Take one tablet by mouth daily    Torsemide 20 Mg Tabs (Torsemide) .Marland Kitchen... 1 tab by mouth two times a day  Problem # 3:  DM (ICD-250.00) Management per primary care. Her updated medication list for this problem includes:    Aspirin 81 Mg Tabs (Aspirin) .Marland Kitchen... 1 tab by mouth once daily    Lantus 100 Unit/ml Soln (Insulin glargine) .Marland KitchenMarland KitchenMarland KitchenMarland Kitchen 65 units at bedtime    Lisinopril 10 Mg Tabs (Lisinopril) .Marland Kitchen... Take one tablet by mouth daily  Problem # 4:  OBSTRUCTIVE SLEEP APNEA (ICD-327.23) Management per pulmonary.  Problem # 5:  UNSPECIFIED HYPOTHYROIDISM (ICD-244.9)  Her updated medication list for this problem includes:    Levothyroxine Sodium 150 Mcg Tabs  (Levothyroxine sodium) .Marland Kitchen... 1 tab by mouth once daily  Patient Instructions: 1)  Your physician recommends that you schedule a follow-up appointment in: 8 WEEKS 2)  Your physician recommends that you return for lab work EA:[id number]

## 2010-11-23 ENCOUNTER — Ambulatory Visit: Payer: Self-pay | Admitting: Dietician

## 2010-11-28 ENCOUNTER — Encounter: Payer: Self-pay | Admitting: Physician Assistant

## 2010-11-28 ENCOUNTER — Ambulatory Visit: Payer: Self-pay | Admitting: Dietician

## 2010-11-28 ENCOUNTER — Telehealth: Payer: Self-pay | Admitting: Physician Assistant

## 2010-11-28 ENCOUNTER — Ambulatory Visit (INDEPENDENT_AMBULATORY_CARE_PROVIDER_SITE_OTHER): Payer: Medicare Other | Admitting: Physician Assistant

## 2010-11-28 ENCOUNTER — Other Ambulatory Visit: Payer: Self-pay

## 2010-11-28 ENCOUNTER — Other Ambulatory Visit: Payer: Self-pay | Admitting: Physician Assistant

## 2010-11-28 DIAGNOSIS — I5033 Acute on chronic diastolic (congestive) heart failure: Secondary | ICD-10-CM

## 2010-11-28 DIAGNOSIS — R0989 Other specified symptoms and signs involving the circulatory and respiratory systems: Secondary | ICD-10-CM

## 2010-11-28 DIAGNOSIS — I10 Essential (primary) hypertension: Secondary | ICD-10-CM

## 2010-11-28 DIAGNOSIS — M1990 Unspecified osteoarthritis, unspecified site: Secondary | ICD-10-CM

## 2010-11-28 HISTORY — DX: Unspecified osteoarthritis, unspecified site: M19.90

## 2010-11-28 LAB — BRAIN NATRIURETIC PEPTIDE: Pro B Natriuretic peptide (BNP): 186 pg/mL — ABNORMAL HIGH (ref 0.0–100.0)

## 2010-11-28 LAB — BASIC METABOLIC PANEL
Calcium: 9 mg/dL (ref 8.4–10.5)
Chloride: 95 mEq/L — ABNORMAL LOW (ref 96–112)
Creatinine, Ser: 1.3 mg/dL — ABNORMAL HIGH (ref 0.4–1.2)
Sodium: 138 mEq/L (ref 135–145)

## 2010-11-28 NOTE — Letter (Signed)
Summary: [facility]   Imported By: Marylou Mccoy 11/24/2010 10:40:53  _____________________________________________________________________  External Attachment:    Type:   Image     Comment:   External Document

## 2010-11-29 ENCOUNTER — Inpatient Hospital Stay (INDEPENDENT_AMBULATORY_CARE_PROVIDER_SITE_OTHER): Payer: Medicare Other | Admitting: Pulmonary Disease

## 2010-11-29 ENCOUNTER — Ambulatory Visit (INDEPENDENT_AMBULATORY_CARE_PROVIDER_SITE_OTHER)
Admission: RE | Admit: 2010-11-29 | Discharge: 2010-11-29 | Disposition: A | Discharge status: Home / Self Care | Payer: Medicare Other | Source: Ambulatory Visit | Attending: Pulmonary Disease | Admitting: Pulmonary Disease

## 2010-11-29 ENCOUNTER — Other Ambulatory Visit: Payer: Self-pay | Admitting: Pulmonary Disease

## 2010-11-29 ENCOUNTER — Encounter: Payer: Self-pay | Admitting: Pulmonary Disease

## 2010-11-29 DIAGNOSIS — I5033 Acute on chronic diastolic (congestive) heart failure: Secondary | ICD-10-CM

## 2010-11-29 DIAGNOSIS — I5032 Chronic diastolic (congestive) heart failure: Secondary | ICD-10-CM

## 2010-11-29 DIAGNOSIS — G4733 Obstructive sleep apnea (adult) (pediatric): Secondary | ICD-10-CM

## 2010-11-29 DIAGNOSIS — I509 Heart failure, unspecified: Secondary | ICD-10-CM

## 2010-12-01 ENCOUNTER — Other Ambulatory Visit: Payer: Self-pay | Admitting: Physician Assistant

## 2010-12-01 ENCOUNTER — Encounter: Payer: Self-pay | Admitting: Physician Assistant

## 2010-12-01 ENCOUNTER — Encounter: Payer: Self-pay | Admitting: Pulmonary Disease

## 2010-12-01 ENCOUNTER — Telehealth: Payer: Self-pay | Admitting: Pulmonary Disease

## 2010-12-01 ENCOUNTER — Ambulatory Visit (INDEPENDENT_AMBULATORY_CARE_PROVIDER_SITE_OTHER): Payer: Medicare Other | Admitting: Physician Assistant

## 2010-12-01 ENCOUNTER — Other Ambulatory Visit: Payer: Self-pay

## 2010-12-01 DIAGNOSIS — I10 Essential (primary) hypertension: Secondary | ICD-10-CM

## 2010-12-01 DIAGNOSIS — I5033 Acute on chronic diastolic (congestive) heart failure: Secondary | ICD-10-CM

## 2010-12-01 LAB — BASIC METABOLIC PANEL
Calcium: 10 mg/dL (ref 8.4–10.5)
GFR: 53.78 mL/min — ABNORMAL LOW (ref >60.00)
Potassium: 4.1 mEq/L (ref 3.5–5.1)
Sodium: 141 mEq/L (ref 135–145)

## 2010-12-04 ENCOUNTER — Other Ambulatory Visit: Payer: Self-pay

## 2010-12-07 ENCOUNTER — Ambulatory Visit: Payer: Self-pay | Admitting: Dietician

## 2010-12-07 NOTE — Assessment & Plan Note (Signed)
Summary: per SW same day [facility] in office....cmf   Visit Type:  Follow-up Primary Provider:  Dr. Sandy [name] ([facility])  CC:  swelling still.  History of Present Illness:  Primary Cardiologist:  Chelsea [name]   Chelsea [name] is a 45 year old female with diastolic CHF.  She just established with Chelsea [name] this month.  She was admitted to [facility] in January with pneumonia and respiratory failure. She was treated with antibiotics and diuretics with improvement. She was transferred to selective medical rehabilitation services at [facility] for rehabilitation. Echocardiogram in January of 2012 showed normal LV function, mild left atrial enlargement and mild aortic insufficiency.   Labs were obtained on 2/14 and her BUN was 49 (up from 10 on 11/05/10); creatinine 1.3 (up from 0.72) and BNP was 84.  She was asked to stop lasix.  She was apparently on metolazone, torsemide and furosemide, but she was not certain of her medications at her initial visit.  I saw her a few days ago with a 20+ pound weight gain and massive edema.  She still was unsure about her meds.  She called [place] back and was only taking torsemide 20 mg two times a day.  I increased this to 40 mg two times a day and added metolazone 5 mg once daily.  He K+ was 60 mEq three times a day.  I decided to keep this dose and get f/u labs today.  Weight was 369.25 lbs on 11/28/10.  She feels much better.  She denies any worsening shortness of breath.  She sleeps propped up on pillows.  This is unchanged.  She denies PND.  She denies chest pain.  She denies syncope.  She has seen pulmonology.  She is to have a sleep study for titration of her CPAP and her weight is down 17 pounds today.  She denies any muscle cramps.  Current Medications (verified): 1)  Advair Diskus 500-50 Mcg/dose Aepb (Fluticasone-Salmeterol) .... Inhale 1 Puff Two Times A Day 2)  Aspirin 81 Mg  Tabs (Aspirin) .Marland Kitchen.. 1 Tab By Mouth Once  Daily 3)  Celebrex 200 Mg Caps (Celecoxib) .... As Needed 4)  Cpap .... As Directed 5)  Endocet 10-325 Mg Tabs (Oxycodone-Acetaminophen) .... As Needed 6)  Estradiol 2 Mg Tabs (Estradiol) .Marland Kitchen.. 1 Tab By Mouth Once Daily 7)  Gabapentin 100 Mg Caps (Gabapentin) .... 3 Tas By Mouth At Bedtime 8)  Levemir Flexpen 100 Unit/ml Soln (Insulin Detemir) .... As Directed 9)  Levothyroxine Sodium 150 Mcg Tabs (Levothyroxine Sodium) .Marland Kitchen.. 1 Tab By Mouth Once Daily 10)  Lisinopril 10 Mg Tabs (Lisinopril) .... Take One Tablet By Mouth Daily 11)  Omeprazole 20 Mg Cpdr (Omeprazole) .Marland Kitchen.. 1 Tab By Mouth Once Daily 12)  Perforomist 20 Mcg/34ml Nebu (Formoterol Fumarate) .... As Directed 13)  Temazepam 30 Mg Caps (Temazepam) .Marland Kitchen.. 1 Tab Po Qhs Prn 14)  Torsemide 20 Mg Tabs (Torsemide) .... 2 Tab By Mouth Two Times A Day 15)  Ventolin Hfa 108 (90 Base) Mcg/act Aers (Albuterol Sulfate) .... As Directed 16)  Metoprolol Tartrate 25 Mg Tabs (Metoprolol Tartrate) .... Take One Tablet By Mouth Twice A Day 17)  Glucosamine Chondroitin Complx  Caps (Glucosamine-Chondroit-Vit C-Mn) .... One Tablet By Mouth Two Times A Day 18)  Klor-Con 20 Meq Pack (Potassium Chloride) .... One Tablet By Mouth Three Times A Day 19)  Famotidine 20 Mg Tabs (Famotidine) .... Take 1 Tablet By Mouth Two Times A Day 20)  Spiriva Handihaler 18  Mcg Caps (Tiotropium Bromide Monohydrate) .... Once Daily 21)  Doxycycline Hyclate 100 Mg Tabs (Doxycycline Hyclate) .... Take 1 Tablet By Mouth Two Times A Day 22)  Metolazone 5 Mg Tabs (Metolazone) .... Two Times A Day 23)  Meclizine Hcl 25 Mg Tabs (Meclizine Hcl) .Marland Kitchen.. 1-4 Times Daily As Needed 24)  Multivitamins   Tabs (Multiple Vitamin) .... Once Daily  Allergies (verified): 1)  ! Pcn  Past History:  Past Medical History: Last updated: 11/28/2010 GERD  HYPOTHYROIDISM  HYPERTENSION  OBSTRUCTIVE SLEEP APNEA/OBESITY HYPOVENTILATION SYNDROME; HOME O2 2004 DM HYPERLIPIDEMIA DIASTOLIC CHF CERVICAL  CANCER Echocardiogram January 2012: EF 55%; mild LVH; mild LAE; mean aortic valve gradient 13  Vital Signs:  Patient profile:   45 year old female Height:      67 inches Weight:      352 pounds BMI:     55.33 Pulse rate:   80 / minute BP sitting:   124 / 66  (right arm) Cuff size:   large  Vitals Entered By: Chelsea [name], RMA (December 01, 2010 9:52 AM)  Physical Exam  General:  Obese, female, in no acute distress;  HEENT: normal Neck: unable to assess JVD at 90 degrees Cardiac:  normal S1, S2; RRR; 2/6 systolic murmur along LSB Lungs:  decreased breath sounds; no obvious rales or wheezing Abd: soft, nontender, no hepatomegaly Ext: 3+  edema to thighs with chronic changes - improved from 2/28 Skin: warm and dry Neuro:  CNs 2-12 intact, no focal abnormalities noted    Impression & Recommendations:  Problem # 1:  ACUTE ON CHRONIC DIASTOLIC HEART FAILURE (ICD-428.33)  Her weight is much improved.  She feels much better.  She actually walked into the office today.  The other day she came in via a wheelchair.  She will continue on her current dose of diuretics.  We had her potassium dose wrong.  She is actually taking 20 mEq 3 times a day.  She will have a basic metabolic panel today.  I will keep a close eye on her renal function and potassium by repeating a basic metabolic panel in 3-4 days.  She will followup in one week.  Orders: TLB-BMP (Basic Metabolic Panel-BMET) ([id number])  Patient Instructions: 1)  Your physician recommends that you schedule a follow-up appointment in: 12/08/10 @ 11:30 to see Chelsea Companies, PA-C 2)  Your physician recommends that you return for lab work in: TODAY BMET 428.33....Marland KitchenCOME BACK IN 12/04/10 FORE REPEAT BMET  ANY TIME BETWEEN 8:30 AM AND 4:15 PM AS PER Chelsea Massaro, PA-C.Marland Kitchen 3)  Your physician recommends that you continue on your current medications as directed. Please refer to the Current Medication list given to you today.

## 2010-12-07 NOTE — Assessment & Plan Note (Addendum)
Summary: hospital follow up per brandi/jd   Visit Type:  Hospital Follow-up Primary Provider/Referring Provider:  Dr. [name] ([facility])  CC:  Pt here for follow up from [facility]. Marland Kitchen  History of Present Illness: 44/F, morbidly obese for FU of CPAP, [facility] admission to [facility] On O2 since 2004 after cervical CA surgery, got ON CPAP around the same time - [facility] . admitted to [facility] in January with pneumonia and respiratory failure. Required [facility] ventilation at [facility] , then  again at [facility], She was treated with antibiotics and diuretics with improvement. [facility] was transferred to [facility]. She was weaned off BiPPA to O2 daytime & nocturnal CPAP. Echocardiogram in January of 2012 showed normal LV function, mild left atrial enlargement and mild aortic insufficiency.   Labs were obtained on 2/14 and her BUN was 49 (up from 10 on 11/05/10); creatinine 1.3 (up from 0.72) and BNP was 84. Diuretics are being adjusted by cardiology. Has gained 50 lbs since dc Advair prior / spiriva started in [facility] - unclear if this is helping Lisinopril noted on med review - denies cough CXR - no effusions , improved from jan'12     Preventive Screening-Counseling & Management  Alcohol-Tobacco     Alcohol drinks/day: occ     Smoking Status: never  Current Medications (verified): 1)  Advair Diskus 500-50 Mcg/dose Aepb (Fluticasone-Salmeterol) .... Inhale 1 Puff Two Times A Day 2)  Aspirin 81 Mg  Tabs (Aspirin) .Marland Kitchen.. 1 Tab By Mouth Once Daily 3)  Celebrex 200 Mg Caps (Celecoxib) .... As Needed 4)  Clonazepam 1 Mg Tabs (Clonazepam) .Marland Kitchen.. 1 Tab By Mouth As Needed 5)  Cpap .... As Directed 6)  Endocet 10-325 Mg Tabs (Oxycodone-Acetaminophen) .... As Needed 7)  Estradiol 2 Mg Tabs (Estradiol) .Marland Kitchen.. 1 Tab By Mouth Once Daily 8)  Gabapentin 100 Mg Caps (Gabapentin) .... 3 Tas By Mouth At Bedtime 9)  Lantus 100 Unit/ml Soln (Insulin Glargine) .... 65 Units At  Bedtime 10)  Levothyroxine Sodium 150 Mcg Tabs (Levothyroxine Sodium) .Marland Kitchen.. 1 Tab By Mouth Once Daily 11)  Lisinopril 10 Mg Tabs (Lisinopril) .... Take One Tablet By Mouth Daily 12)  Omeprazole 20 Mg Cpdr (Omeprazole) .Marland Kitchen.. 1 Tab By Mouth Once Daily 13)  Perforomist 20 Mcg/67ml Nebu (Formoterol Fumarate) .... As Directed 14)  Temazepam 30 Mg Caps (Temazepam) .Marland Kitchen.. 1 Tab Po Qhs Prn 15)  Torsemide 20 Mg Tabs (Torsemide) .Marland Kitchen.. 1 Tab By Mouth Two Times A Day 16)  Ventolin Hfa 108 (90 Base) Mcg/act Aers (Albuterol Sulfate) .... As Directed 17)  Metoprolol Tartrate 25 Mg Tabs (Metoprolol Tartrate) .... Take One Tablet By Mouth Twice A Day 18)  Glucosamine Chondroitin Complx  Caps (Glucosamine-Chondroit-Vit C-Mn) .... One Tablet By Mouth Two Times A Day 19)  Klor-Con 20 Meq Pack (Potassium Chloride) .... One Tablet By Mouth Three Times A Day 20)  Famotidine 20 Mg Tabs (Famotidine) .... Take 1 Tablet By Mouth Once A Day 21)  Spiriva Handihaler 18 Mcg Caps (Tiotropium Bromide Monohydrate) .... Once Daily 22)  Doxycycline Hyclate 100 Mg Tabs (Doxycycline Hyclate) .... Take 1 Tablet By Mouth Two Times A Day  Allergies (verified): 1)  ! Pcn  Past History:  Past Medical History: Last updated: 11/28/2010 GERD  HYPOTHYROIDISM  HYPERTENSION  OBSTRUCTIVE SLEEP APNEA/OBESITY HYPOVENTILATION SYNDROME; HOME O2 2004 DM HYPERLIPIDEMIA DIASTOLIC CHF CERVICAL CANCER Echocardiogram January 2012: EF 55%; mild LVH; mild LAE; mean aortic valve gradient 13  Past Surgical History:  Last updated: 11/13/2010 SURGERY FOR CERVICAL CANCER HYSTERECTOMY CHOLECYSTECTOMY TONSILLECTOMY  Family History: Last updated: 11/13/2010 FATHER WITH UNKNOWN HEART PROBLEM  Social History: Last updated: 11/13/2010 Disabled  Single  Tobacco Use - No.  Alcohol Use - yes  Social History: Alcohol drinks/day:  occ  Review of Systems       The patient complains of shortness of breath with activity, weight change, sore throat,  itching, ear ache, hand/feet swelling, joint stiffness or pain, and change in color of mucus.  The patient denies shortness of breath at rest, productive cough, non-productive cough, coughing up blood, chest pain, irregular heartbeats, acid heartburn, indigestion, loss of appetite, abdominal pain, difficulty swallowing, tooth/dental problems, headaches, nasal congestion/difficulty breathing through nose, sneezing, anxiety, depression, rash, and fever.    Vital Signs:  Patient profile:   45 year old female Height:      67 inches Weight:      366 pounds BMI:     57.53 O2 Sat:      93 % on 2 L/min pulsed Temp:     98.0 degrees F oral Pulse rate:   63 / minute BP sitting:   122 / 78  (right arm) Cuff size:   large (wrist)  Vitals Entered By: Zackery Barefoot CMA (November 29, 2010 3:07 PM)  O2 Flow:  2 L/min pulsed CC: Pt here for follow up from [facility].  Is Patient Diabetic? Yes Comments Medications reviewed with patient Verified contact number and pharmacy with patient Zackery Barefoot CMA  November 29, 2010 3:10 PM    Physical Exam  Additional Exam:  Gen. Pleasant, morbidly obese, in no distress, normal affect ENT - no lesions, no post nasal drip Neck: No JVD, no thyromegaly, no carotid bruits Lungs: no use of accessory muscles, no dullness to percussion, clear without rales or rhonchi  Cardiovascular: Rhythm regular, heart sounds  normal, no murmurs or gallops, 2+  peripheral edema Abdomen: soft and non-tender, no hepatosplenomegaly, BS normal. Musculoskeletal: No deformities, no cyanosis or clubbing Neuro:  alert, non focal     CXR  Procedure date:  11/29/2010  Findings:      IMPRESSION: Stable cardiomegaly. No evidence of acute cardiopulmonary disease.  Impression & Recommendations:  Problem # 1:  OBSTRUCTIVE SLEEP APNEA (ICD-327.23) proceed with in lab cppa titration with use of BiPAP & O2 as needed given her wt gain & recent deterioration Compliance  encouraged, wt loss emphasized, asked to avoid meds with sedative side effects, cautioned against driving when sleepy.  Orders: Radiology Referral (Radiology) Est. Patient Level V [phone])  Problem # 2:  CHRONIC DIASTOLIC HEART FAILURE (ICD-428.32)  wt gain since dc due to fluid retention, diuretics being titrated up by cards cxr today does not show pulm edema stay on O2 Her updated medication list for this problem includes:    Aspirin 81 Mg Tabs (Aspirin) .Marland Kitchen... 1 tab by mouth once daily    Lisinopril 10 Mg Tabs (Lisinopril) .Marland Kitchen... Take one tablet by mouth daily    Torsemide 20 Mg Tabs (Torsemide) .Marland Kitchen... 1 tab by mouth two times a day    Metoprolol Tartrate 25 Mg Tabs (Metoprolol tartrate) .Marland Kitchen... Take one tablet by mouth twice a day  Orders: Est. Patient Level V (60454)  Medications Added to Medication List This Visit: 1)  Advair Diskus 500-50 Mcg/dose Aepb (Fluticasone-salmeterol) .... Inhale 1 puff two times a day 2)  Famotidine 20 Mg Tabs (Famotidine) .... Take 1 tablet by mouth once a day 3)  Spiriva  Handihaler 18 Mcg Caps (Tiotropium bromide monohydrate) .... Once daily 4)  Doxycycline Hyclate 100 Mg Tabs (Doxycycline hyclate) .... Take 1 tablet by mouth two times a day  Other Orders: T-2 View CXR ([id number])  Patient Instructions: 1)  Copy sent to:dr escejeda, dr Jens Som 2)  Please schedule a follow-up appointment in 1 month. 3)  A chest x-ray has been recommended.  Your imaging study may require preauthorization.  4)  Sleep study to adjust CPAP   Immunization History:  Influenza Immunization History:    Influenza:  historical (08/07/2010)   Appended Document: hospital follow up per brandi/jd PSG in aug'04 9wt 370) >> severe obstructive sleep apnea with AHI 80/h corrected by CPAP 20 cm + o2

## 2010-12-07 NOTE — Progress Notes (Signed)
  Phone Note Outgoing Call   Summary of Call: I called [facility] and spoke with Vickki Muff who advised he will fax pt's sleep study as soon as he can retrieve it.  Initial call taken by: Zackery Barefoot CMA,  December 01, 2010 10:41 AM

## 2010-12-07 NOTE — Progress Notes (Signed)
Summary: pt called to give medication info**rtn call**  Phone Note Call from Patient Call back at [phone]   Caller: Patient Reason for Call: Talk to Nurse, Talk to Doctor Summary of Call: furofemide 80mg  two times a day metolazone 5mg  q12h K+ 60 meq three times a day eorfemide 20mg  two times a day with zaroxolyn  per pt call she was told to call Okey Regal and give this information  Initial call taken by: Omer Jack,  November 28, 2010 4:50 PM  Follow-up for Phone Call        I left a message for the patient to call back.  I cannot tell from the above what diuretics she is taking.  She was to call us back to tell us exactly what diuretics and how much K+ she is taking right now so we can adjust them.  Follow-up by: Tereso Newcomer PA-C,  November 28, 2010 5:26 PM  Additional Follow-up for Phone Call Additional follow up Details #1::        spoke with pt, the only dieurtic she is taking right now per pt is torsemide 20mg  two times a day. her potassium is three times a day . will foward for Angeldejesus Callaham's review Deliah Goody, RN  November 28, 2010 6:10 PM\par     Additional Follow-up for Phone Call Additional follow up Details #2::    Increase torsemide:  Torsemide 20 mg take 2 tabs by mouth two times a day. Restart Metolazone 5 mg 1 by mouth once daily.  Please have her recheck the potassium.  That is a lot of potassium.  If she really is taking 60 mEq three times a day . . . do not change.  If it is different than this, let me know. Follow-up by: Tereso Newcomer PA-C,  November 29, 2010 8:12 AM  Additional Follow-up for Phone Call Additional follow up Details #3:: Details for Additional Follow-up Action Taken: PT RTN CALL YOU CAN REACH HER AT [phone] Omer Jack  November 29, 2010 9:55 AM   [facility] for patient to notify her of meds per Darden Dates  LPN,  November 29, 2010 10:40 AM Attempted to reach pt.  Left message to call back. Dossie Arbour, RN,  BSN  November 29, 2010 3:17 PM Spoke with pt who confirmed she is taking potassium 60 meq three times a day. She is aware to continue this. I gave pt instructions as above regarding increase of torsemide and restarting of metolazone. She verbalizes understanding of al instructions and is aware that lab work will be checked again on Friday when here for follow up.  Additional Follow-up by: Dossie Arbour, RN, BSN,  November 29, 2010 3:32 PM

## 2010-12-07 NOTE — Assessment & Plan Note (Signed)
Summary: Edema   Visit Type:  rov  CC:  shortness of breath and headaches a lot of swelling.  History of Present Illness: Primary Cardiologist:  Dr. Olga Millers   Chelsea [name] is a 45 year old female with diastolic CHF.  She just established with Dr. Jens Som this month.  She was admitted to [facility] in January with pneumonia and respiratory failure. She was treated with antibiotics and diuretics with improvement. [facility] for rehabilitation. Echocardiogram in January of 2012 showed normal LV function, mild left atrial enlargement and mild aortic insufficiency.   Labs were obtained on 2/14 and her BUN was 49 (up from 10 on 11/05/10); creatinine 1.3 (up from 0.72) and BNP was 84.  She was asked to stop lasix.  She was apparently on metolazone, torsemide and furosemide, but she was not certain of her medications at her initial visit.  She now presents with complaints of swelling.  Since she was last seen, her weight is up 26 pounds.  She has developed significant edema in her lower extremities.  She describes increasing shortness of breath with exertion.  She describes NYHA class III symptoms.  She denies shortness of breath at rest.  She sleeps at an incline chronically.  This is unchanged.  She denies PND.  She denies chest pain.  She denies syncope.  Current Medications (verified): 1)  Advair Diskus 500-50 Mcg/dose Aepb (Fluticasone-Salmeterol) .... As Directed 2)  Aspirin 81 Mg  Tabs (Aspirin) .Marland Kitchen.. 1 Tab By Mouth Once Daily 3)  Celebrex 200 Mg Caps (Celecoxib) .... As Needed 4)  Clonazepam 1 Mg Tabs (Clonazepam) .Marland Kitchen.. 1 Tab By Mouth As Needed 5)  Cpap .... As Directed 6)  Endocet 10-325 Mg Tabs (Oxycodone-Acetaminophen) .... As Needed 7)  Estradiol 2 Mg Tabs (Estradiol) .Marland Kitchen.. 1 Tab By Mouth Once Daily 8)  Gabapentin 100 Mg Caps (Gabapentin) .... 3 Tas By Mouth At Bedtime 9)  Lantus 100  Unit/ml Soln (Insulin Glargine) .... 65 Units At Bedtime 10)  Levothyroxine Sodium 150 Mcg Tabs (Levothyroxine Sodium) .Marland Kitchen.. 1 Tab By Mouth Once Daily 11)  Lisinopril 10 Mg Tabs (Lisinopril) .... Take One Tablet By Mouth Daily 12)  Omeprazole 20 Mg Cpdr (Omeprazole) .Marland Kitchen.. 1 Tab By Mouth Once Daily 13)  Perforomist 20 Mcg/76ml Nebu (Formoterol Fumarate) .... As Directed 14)  Temazepam 30 Mg Caps (Temazepam) .Marland Kitchen.. 1 Tab Po Qhs Prn 15)  Torsemide 20 Mg Tabs (Torsemide) .Marland Kitchen.. 1 Tab By Mouth Two Times A Day 16)  Ventolin Hfa 108 (90 Base) Mcg/act Aers (Albuterol Sulfate) .... As Directed 17)  Metoprolol Tartrate 25 Mg Tabs (Metoprolol Tartrate) .... Take One Tablet By Mouth Twice A Day 18)  Glucosamine Chondroitin Complx  Caps (Glucosamine-Chondroit-Vit C-Mn) .... One Tablet By Mouth Two Times A Day 19)  Klor-Con 20 Meq Pack (Potassium Chloride) .... One Tablet By Mouth Three Times A Day 20)  Famotidine 20 Mg Tabs (Famotidine) .... Onr Tablet By Mouth Two Times A Day  Allergies (verified): 1)  ! Pcn  Past History:  Past Medical History: GERD  HYPOTHYROIDISM  HYPERTENSION  OBSTRUCTIVE SLEEP APNEA/OBESITY HYPOVENTILATION SYNDROME; HOME O2 2004 DM HYPERLIPIDEMIA DIASTOLIC CHF CERVICAL CANCER Echocardiogram January 2012: EF 55%; mild LVH; mild LAE; mean aortic valve gradient 13  Family History: Reviewed history from 11/13/2010 and no changes required. FATHER WITH UNKNOWN HEART PROBLEM  Social History: Reviewed history from 11/13/2010 and no changes required. Disabled  Single  Tobacco Use -  No.  Alcohol Use - yes  Review of Systems       As per  the HPI.  All other systems reviewed and negative.   Vital Signs:  Patient profile:   45 year old female Height:      67 inches Weight:      369.25 pounds BMI:     58.04 Pulse rate:   72 / minute BP sitting:   99 / 54  (right arm)  Vitals Entered By: Caralee Ates CMA (November 28, 2010 12:36 PM)  Physical Exam  General:  Obese,  female, in no acute distress; falling asleep often during interview HEENT: normal Neck: unable to assess JVD at 90 degrees Cardiac:  normal S1, S2; RRR; 2/6 systolic murmur along LSB Lungs:  decreased breath sounds; no obvious rales or wheezing Abd: soft, nontender, no hepatomegaly Ext: 3+  edema to thighs with chronic changes and weeping Skin: warm and dry Neuro:  CNs 2-12 intact, no focal abnormalities noted    Impression & Recommendations:  Problem # 1:  ACUTE ON CHRONIC DIASTOLIC HEART FAILURE (ICD-428.33) She is significantly volume overloaded.  Unfortunately, we are unsure of what diuretic she is currently taking.  I spent a significant amount of time with the patient and talking to the pharmacist at her pharmacy trying to find out exactly which diuretic she is on.  She did not bring her medicines with her today.  She was told to stop her Lasix and metolazone recently.  She believes that she is still taking Lasix.  Upon further questioning she thought that she was taking torsemide.  At this point, we do not know what she is taking at home.  I do not feel comfortable adjusting her diuretics without knowing exactly what she is taking.  I will have her review her medicines when she gets home and call us back this afternoon.  I will adjust her diuretics with this information.  She will have a bmet and bnp today.   She will follow up with me or Dr. Jens Som in 2-3 days.    Problem # 2:  DEGENERATIVE JOINT DISEASE (ICD-715.90) She was lethargic and falling asleep alot today during the interview and exam.  I question if she is over-medicated . . . . especially in terms of narcotics.  She also has a h/o COPD, so she may also have chronic respiratory acidosis contributing to her lethargy.  Problem # 3:  OBSTRUCTIVE SLEEP APNEA (ICD-327.23) See above.  Consider having her see pulmonary at some point.  Other Orders: TLB-BMP (Basic Metabolic Panel-BMET) (80048-METABOL) TLB-BNP (B-Natriuretic  Peptide) (83880-BNPR)  Patient Instructions: 1)  Your physician recommends that you schedule a follow-up appointment in: 12/01/10 @ 9:30 with Tereso Newcomer, PA-C same day Dr. Jens Som is in the office. 2)  Your physician recommends that you return for lab work in: TODAY BMET 428.33, 401.1, BNP 428.33, 782.3 3)  YOU HAVE BEEN GIVEN A PAPER TODAY TO GO HOME AFTER APPOINTMENT AND WRITE DOWN THE MEDICATIONS WE HAVE ASKED FOR ON THE PIECE OF PAPER AND CALL OUR OFFICE BACK TODAY @ [phone] AND ASK FOR CAROL FIATO, CMA SO THAT WE CAN CORRECT YOUR MEDICATION LIST SO THAT THE PA CAN CORRECTLY TREAT YOU. ....VERY IMPORTANT TO BRING ALL YOUR MEDICATIONS THAT YOU ARE CURRENTLY TAKING TO THE NEXT APPOINTMENT ON 12/01/10 WITH [place].

## 2010-12-08 ENCOUNTER — Inpatient Hospital Stay (HOSPITAL_COMMUNITY)
Admission: EM | Admit: 2010-12-08 | Discharge: 2010-12-15 | DRG: 189 | Disposition: A | Discharge status: Home Health | Payer: Medicare Other | Attending: Internal Medicine | Admitting: Internal Medicine

## 2010-12-08 ENCOUNTER — Other Ambulatory Visit: Payer: Self-pay | Admitting: Internal Medicine

## 2010-12-08 ENCOUNTER — Inpatient Hospital Stay (HOSPITAL_COMMUNITY): Payer: Medicare Other

## 2010-12-08 ENCOUNTER — Ambulatory Visit: Payer: Self-pay | Admitting: Physician Assistant

## 2010-12-08 ENCOUNTER — Emergency Department (HOSPITAL_COMMUNITY): Payer: Medicare Other

## 2010-12-08 DIAGNOSIS — E785 Hyperlipidemia, unspecified: Secondary | ICD-10-CM | POA: Diagnosis present

## 2010-12-08 DIAGNOSIS — I509 Heart failure, unspecified: Secondary | ICD-10-CM | POA: Diagnosis present

## 2010-12-08 DIAGNOSIS — E039 Hypothyroidism, unspecified: Secondary | ICD-10-CM | POA: Diagnosis present

## 2010-12-08 DIAGNOSIS — G929 Unspecified toxic encephalopathy: Secondary | ICD-10-CM | POA: Diagnosis present

## 2010-12-08 DIAGNOSIS — R579 Shock, unspecified: Secondary | ICD-10-CM

## 2010-12-08 DIAGNOSIS — L03119 Cellulitis of unspecified part of limb: Secondary | ICD-10-CM | POA: Diagnosis present

## 2010-12-08 DIAGNOSIS — N179 Acute kidney failure, unspecified: Secondary | ICD-10-CM | POA: Diagnosis present

## 2010-12-08 DIAGNOSIS — I872 Venous insufficiency (chronic) (peripheral): Secondary | ICD-10-CM | POA: Diagnosis present

## 2010-12-08 DIAGNOSIS — E662 Morbid (severe) obesity with alveolar hypoventilation: Secondary | ICD-10-CM | POA: Diagnosis present

## 2010-12-08 DIAGNOSIS — G92 Toxic encephalopathy: Secondary | ICD-10-CM | POA: Diagnosis present

## 2010-12-08 DIAGNOSIS — J9620 Acute and chronic respiratory failure, unspecified whether with hypoxia or hypercapnia: Principal | ICD-10-CM | POA: Diagnosis present

## 2010-12-08 DIAGNOSIS — E119 Type 2 diabetes mellitus without complications: Secondary | ICD-10-CM | POA: Diagnosis present

## 2010-12-08 DIAGNOSIS — R6521 Severe sepsis with septic shock: Secondary | ICD-10-CM | POA: Diagnosis not present

## 2010-12-08 DIAGNOSIS — G4733 Obstructive sleep apnea (adult) (pediatric): Secondary | ICD-10-CM | POA: Diagnosis present

## 2010-12-08 DIAGNOSIS — A419 Sepsis, unspecified organism: Secondary | ICD-10-CM | POA: Diagnosis not present

## 2010-12-08 DIAGNOSIS — D649 Anemia, unspecified: Secondary | ICD-10-CM | POA: Diagnosis present

## 2010-12-08 DIAGNOSIS — R4182 Altered mental status, unspecified: Secondary | ICD-10-CM

## 2010-12-08 DIAGNOSIS — J4489 Other specified chronic obstructive pulmonary disease: Secondary | ICD-10-CM | POA: Diagnosis present

## 2010-12-08 DIAGNOSIS — L02419 Cutaneous abscess of limb, unspecified: Secondary | ICD-10-CM | POA: Diagnosis present

## 2010-12-08 DIAGNOSIS — I5030 Unspecified diastolic (congestive) heart failure: Secondary | ICD-10-CM | POA: Diagnosis present

## 2010-12-08 DIAGNOSIS — J449 Chronic obstructive pulmonary disease, unspecified: Secondary | ICD-10-CM | POA: Diagnosis present

## 2010-12-08 LAB — GLUCOSE, CAPILLARY
Glucose-Capillary: 82 mg/dL (ref 70–99)
Glucose-Capillary: 127 mg/dL — ABNORMAL HIGH (ref 70–99)
Glucose-Capillary: 123 mg/dL — ABNORMAL HIGH (ref 70–99)

## 2010-12-08 LAB — CARDIAC PANEL(CRET KIN+CKTOT+MB+TROPI)
CK, MB: 6.6 ng/mL (ref 0.3–4.0)
Relative Index: 1.5 (ref 0.0–2.5)
Relative Index: 1.2 (ref 0.0–2.5)
Total CK: 701 U/L — ABNORMAL HIGH (ref 7–177)
Total CK: 564 U/L — ABNORMAL HIGH (ref 7–177)
Troponin I: 0.02 ng/mL (ref 0.00–0.06)
Troponin I: 0.05 ng/mL (ref 0.00–0.06)

## 2010-12-08 LAB — ACETAMINOPHEN LEVEL: Acetaminophen (Tylenol), Serum: <10 ug/mL — ABNORMAL LOW (ref 10–30)

## 2010-12-08 LAB — BASIC METABOLIC PANEL
Chloride: 93 mEq/L — ABNORMAL LOW (ref 96–112)
GFR calc Af Amer: 14 mL/min — ABNORMAL LOW (ref >60)
Potassium: 4.6 mEq/L (ref 3.5–5.1)

## 2010-12-08 LAB — TSH: TSH: 0.968 u[IU]/mL (ref 0.350–4.500)

## 2010-12-08 LAB — BLOOD GAS, ARTERIAL
Bicarbonate: 30.2 mEq/L — ABNORMAL HIGH (ref 20.0–24.0)
Bicarbonate: 29.5 mEq/L — ABNORMAL HIGH (ref 20.0–24.0)
Delivery systems: POSITIVE
FIO2: 0.4 %
O2 Saturation: 99.2 %
PEEP: 6 cmH2O
Patient temperature: 98.6
TCO2: 32 mmol/L (ref 0–100)
TCO2: 31.3 mmol/L (ref 0–100)
pCO2 arterial: 58 mmHg (ref 35.0–45.0)
pH, Arterial: 7.327 — ABNORMAL LOW (ref 7.350–7.400)
pO2, Arterial: 77.2 mmHg — ABNORMAL LOW (ref 80.0–100.0)

## 2010-12-08 LAB — CBC
HCT: 29.5 % — ABNORMAL LOW (ref 36.0–46.0)
MCH: 28.5 pg (ref 26.0–34.0)
MCV: 92.5 fL (ref 78.0–100.0)
Platelets: 222 10*3/uL (ref 150–400)
RDW: 15 % (ref 11.5–15.5)

## 2010-12-08 LAB — DIFFERENTIAL
Eosinophils Absolute: 0.6 10*3/uL (ref 0.0–0.7)
Eosinophils Relative: 5 % (ref 0–5)
Monocytes Absolute: 1 10*3/uL (ref 0.1–1.0)
Neutro Abs: 7.4 10*3/uL (ref 1.7–7.7)
Neutrophils Relative %: 59 % (ref 43–77)

## 2010-12-08 LAB — URINALYSIS, ROUTINE W REFLEX MICROSCOPIC
Leukocytes, UA: NEGATIVE
Nitrite: NEGATIVE
Protein, ur: NEGATIVE mg/dL
Specific Gravity, Urine: 1.01 (ref 1.005–1.030)
Urobilinogen, UA: 0.2 mg/dL (ref 0.0–1.0)

## 2010-12-08 LAB — URINE MICROSCOPIC-ADD ON

## 2010-12-08 LAB — POCT I-STAT 3, ART BLOOD GAS (G3+)
Bicarbonate: 33.4 mEq/L — ABNORMAL HIGH (ref 20.0–24.0)
pCO2 arterial: 60.1 mmHg (ref 35.0–45.0)
pO2, Arterial: 56 mmHg — ABNORMAL LOW (ref 80.0–100.0)

## 2010-12-08 LAB — COMPREHENSIVE METABOLIC PANEL
ALT: 18 U/L (ref 0–35)
AST: 37 U/L (ref 0–37)
Albumin: 3.2 g/dL — ABNORMAL LOW (ref 3.5–5.2)
Alkaline Phosphatase: 77 U/L (ref 39–117)
BUN: 86 mg/dL — ABNORMAL HIGH (ref 6–23)
Calcium: 8.7 mg/dL (ref 8.4–10.5)
Creatinine, Ser: 4.49 mg/dL — ABNORMAL HIGH (ref 0.4–1.2)
GFR calc Af Amer: 17 mL/min — ABNORMAL LOW (ref >60)
Glucose, Bld: 94 mg/dL (ref 70–99)
Glucose, Bld: 100 mg/dL — ABNORMAL HIGH (ref 70–99)
Sodium: 137 mEq/L (ref 135–145)
Total Bilirubin: 0.6 mg/dL (ref 0.3–1.2)
Total Protein: 6.8 g/dL (ref 6.0–8.3)
Total Protein: 6.7 g/dL (ref 6.0–8.3)

## 2010-12-08 LAB — LACTIC ACID, PLASMA
Lactic Acid, Venous: 1 mmol/L (ref 0.5–2.2)
Lactic Acid, Venous: 0.6 mmol/L (ref 0.5–2.2)

## 2010-12-08 LAB — CK TOTAL AND CKMB (NOT AT ARMC): Total CK: 759 U/L — ABNORMAL HIGH (ref 7–177)

## 2010-12-08 LAB — RAPID URINE DRUG SCREEN, HOSP PERFORMED
Amphetamines: NOT DETECTED
Barbiturates: NOT DETECTED
Benzodiazepines: NOT DETECTED
Cocaine: NOT DETECTED
Opiates: POSITIVE — AB
Tetrahydrocannabinol: NOT DETECTED

## 2010-12-08 LAB — ETHANOL: Alcohol, Ethyl (B): <5 mg/dL (ref 0–10)

## 2010-12-08 LAB — TROPONIN I: Troponin I: 0.01 ng/mL (ref 0.00–0.06)

## 2010-12-08 LAB — LIPASE, BLOOD: Lipase: 17 U/L (ref 11–59)

## 2010-12-08 LAB — MAGNESIUM: Magnesium: 2.3 mg/dL (ref 1.5–2.5)

## 2010-12-08 LAB — BRAIN NATRIURETIC PEPTIDE: Pro B Natriuretic peptide (BNP): <30 pg/mL (ref 0.0–100.0)

## 2010-12-09 ENCOUNTER — Inpatient Hospital Stay (HOSPITAL_COMMUNITY): Payer: Medicare Other

## 2010-12-09 DIAGNOSIS — L03119 Cellulitis of unspecified part of limb: Secondary | ICD-10-CM

## 2010-12-09 DIAGNOSIS — R4182 Altered mental status, unspecified: Secondary | ICD-10-CM

## 2010-12-09 DIAGNOSIS — N179 Acute kidney failure, unspecified: Secondary | ICD-10-CM

## 2010-12-09 DIAGNOSIS — L02419 Cutaneous abscess of limb, unspecified: Secondary | ICD-10-CM

## 2010-12-09 DIAGNOSIS — R579 Shock, unspecified: Secondary | ICD-10-CM

## 2010-12-09 LAB — CBC
HCT: 26.5 % — ABNORMAL LOW (ref 36.0–46.0)
MCHC: 32.5 g/dL (ref 30.0–36.0)
Platelets: 224 10*3/uL (ref 150–400)
RDW: 14.7 % (ref 11.5–15.5)
WBC: 8.1 10*3/uL (ref 4.0–10.5)

## 2010-12-09 LAB — LACTIC ACID, PLASMA: Lactic Acid, Venous: 0.5 mmol/L (ref 0.5–2.2)

## 2010-12-09 LAB — COMPREHENSIVE METABOLIC PANEL
ALT: 16 U/L (ref 0–35)
Albumin: 2.8 g/dL — ABNORMAL LOW (ref 3.5–5.2)
Alkaline Phosphatase: 75 U/L (ref 39–117)
BUN: 59 mg/dL — ABNORMAL HIGH (ref 6–23)
Calcium: 8.7 mg/dL (ref 8.4–10.5)
Glucose, Bld: 118 mg/dL — ABNORMAL HIGH (ref 70–99)
Potassium: 3.7 mEq/L (ref 3.5–5.1)
Sodium: 140 mEq/L (ref 135–145)
Total Protein: 6.7 g/dL (ref 6.0–8.3)

## 2010-12-09 LAB — URINE CULTURE

## 2010-12-09 LAB — GLUCOSE, CAPILLARY
Glucose-Capillary: 137 mg/dL — ABNORMAL HIGH (ref 70–99)
Glucose-Capillary: 118 mg/dL — ABNORMAL HIGH (ref 70–99)

## 2010-12-09 LAB — DIFFERENTIAL
Basophils Absolute: 0 10*3/uL (ref 0.0–0.1)
Eosinophils Absolute: 0 10*3/uL (ref 0.0–0.7)
Eosinophils Relative: 0 % (ref 0–5)
Monocytes Absolute: 0.3 10*3/uL (ref 0.1–1.0)

## 2010-12-09 LAB — MAGNESIUM: Magnesium: 2.3 mg/dL (ref 1.5–2.5)

## 2010-12-10 LAB — BASIC METABOLIC PANEL
BUN: 33 mg/dL — ABNORMAL HIGH (ref 6–23)
CO2: 33 mEq/L — ABNORMAL HIGH (ref 19–32)
Chloride: 99 mEq/L (ref 96–112)
Creatinine, Ser: 1.05 mg/dL (ref 0.4–1.2)
Glucose, Bld: 125 mg/dL — ABNORMAL HIGH (ref 70–99)
Potassium: 3.6 mEq/L (ref 3.5–5.1)

## 2010-12-10 LAB — CBC
HCT: 27.8 % — ABNORMAL LOW (ref 36.0–46.0)
Hemoglobin: 8.9 g/dL — ABNORMAL LOW (ref 12.0–15.0)
MCH: 29.8 pg (ref 26.0–34.0)
MCV: 93 fL (ref 78.0–100.0)
RBC: 2.99 MIL/uL — ABNORMAL LOW (ref 3.87–5.11)
WBC: 11.1 10*3/uL — ABNORMAL HIGH (ref 4.0–10.5)

## 2010-12-10 LAB — GLUCOSE, CAPILLARY
Glucose-Capillary: 157 mg/dL — ABNORMAL HIGH (ref 70–99)
Glucose-Capillary: 147 mg/dL — ABNORMAL HIGH (ref 70–99)

## 2010-12-11 ENCOUNTER — Inpatient Hospital Stay (HOSPITAL_COMMUNITY): Payer: Medicare Other

## 2010-12-11 DIAGNOSIS — R609 Edema, unspecified: Secondary | ICD-10-CM

## 2010-12-11 DIAGNOSIS — I517 Cardiomegaly: Secondary | ICD-10-CM

## 2010-12-11 LAB — CBC
HCT: 26.8 % — ABNORMAL LOW (ref 36.0–46.0)
MCH: 30.1 pg (ref 26.0–34.0)
MCV: 93.7 fL (ref 78.0–100.0)
RDW: 14.5 % (ref 11.5–15.5)
WBC: 11.1 10*3/uL — ABNORMAL HIGH (ref 4.0–10.5)

## 2010-12-11 LAB — BASIC METABOLIC PANEL
BUN: 13 mg/dL (ref 6–23)
Chloride: 99 mEq/L (ref 96–112)
Creatinine, Ser: 0.82 mg/dL (ref 0.4–1.2)
GFR calc non Af Amer: >60 mL/min (ref >60)
Glucose, Bld: 133 mg/dL — ABNORMAL HIGH (ref 70–99)
Potassium: 3.3 mEq/L — ABNORMAL LOW (ref 3.5–5.1)

## 2010-12-11 LAB — GLUCOSE, CAPILLARY: Glucose-Capillary: 147 mg/dL — ABNORMAL HIGH (ref 70–99)

## 2010-12-12 LAB — BASIC METABOLIC PANEL WITH GFR
BUN: 9 mg/dL (ref 6–23)
CO2: 33 meq/L — ABNORMAL HIGH (ref 19–32)
Calcium: 9 mg/dL (ref 8.4–10.5)
Chloride: 97 meq/L (ref 96–112)
Creatinine, Ser: 0.8 mg/dL (ref 0.4–1.2)
GFR calc non Af Amer: >60 mL/min
Glucose, Bld: 127 mg/dL — ABNORMAL HIGH (ref 70–99)
Potassium: 3.6 meq/L (ref 3.5–5.1)
Sodium: 140 meq/L (ref 135–145)

## 2010-12-12 LAB — GLUCOSE, CAPILLARY
Glucose-Capillary: 171 mg/dL — ABNORMAL HIGH (ref 70–99)
Glucose-Capillary: 165 mg/dL — ABNORMAL HIGH (ref 70–99)
Glucose-Capillary: 147 mg/dL — ABNORMAL HIGH (ref 70–99)

## 2010-12-12 LAB — CBC
HCT: 26 % — ABNORMAL LOW (ref 36.0–46.0)
Hemoglobin: 8.3 g/dL — ABNORMAL LOW (ref 12.0–15.0)
MCH: 29.6 pg (ref 26.0–34.0)
MCHC: 31.9 g/dL (ref 30.0–36.0)
MCV: 92.9 fL (ref 78.0–100.0)
Platelets: 226 10*3/uL (ref 150–400)
RBC: 2.8 MIL/uL — ABNORMAL LOW (ref 3.87–5.11)
RDW: 14.4 % (ref 11.5–15.5)
WBC: 10.8 10*3/uL — ABNORMAL HIGH (ref 4.0–10.5)

## 2010-12-12 LAB — BRAIN NATRIURETIC PEPTIDE: Pro B Natriuretic peptide (BNP): 489 pg/mL — ABNORMAL HIGH (ref 0.0–100.0)

## 2010-12-12 NOTE — Letter (Signed)
Summary: 2004 sleep study  2004 sleep study   Imported By: Kassie Mends 12/08/2010 09:26:47  _____________________________________________________________________  External Attachment:    Type:   Image     Comment:   External Document

## 2010-12-12 NOTE — Letter (Signed)
Summary: [facility]  [facility]   Imported By: Kassie Mends 12/08/2010 09:30:22  _____________________________________________________________________  External Attachment:    Type:   Image     Comment:   External Document

## 2010-12-13 ENCOUNTER — Inpatient Hospital Stay (HOSPITAL_COMMUNITY): Payer: Medicare Other

## 2010-12-13 ENCOUNTER — Telehealth: Payer: Self-pay | Admitting: Cardiology

## 2010-12-13 LAB — BASIC METABOLIC PANEL
BUN: 13 mg/dL (ref 6–23)
Calcium: 8.9 mg/dL (ref 8.4–10.5)
Creatinine, Ser: 0.84 mg/dL (ref 0.4–1.2)
GFR calc non Af Amer: >60 mL/min (ref >60)

## 2010-12-13 LAB — GLUCOSE, CAPILLARY: Glucose-Capillary: 132 mg/dL — ABNORMAL HIGH (ref 70–99)

## 2010-12-14 LAB — GLUCOSE, CAPILLARY
Glucose-Capillary: 113 mg/dL — ABNORMAL HIGH (ref 70–99)
Glucose-Capillary: 154 mg/dL — ABNORMAL HIGH (ref 70–99)
Glucose-Capillary: 137 mg/dL — ABNORMAL HIGH (ref 70–99)

## 2010-12-14 LAB — CBC
HCT: 27 % — ABNORMAL LOW (ref 36.0–46.0)
Hemoglobin: 8.6 g/dL — ABNORMAL LOW (ref 12.0–15.0)
MCHC: 31.9 g/dL (ref 30.0–36.0)
Platelets: 276 10*3/uL (ref 150–400)
RBC: 2.94 MIL/uL — ABNORMAL LOW (ref 3.87–5.11)
RDW: 14.1 % (ref 11.5–15.5)

## 2010-12-14 LAB — COMPREHENSIVE METABOLIC PANEL
ALT: 19 U/L (ref 0–35)
Albumin: 2.6 g/dL — ABNORMAL LOW (ref 3.5–5.2)
Alkaline Phosphatase: 73 U/L (ref 39–117)
Calcium: 9 mg/dL (ref 8.4–10.5)
Glucose, Bld: 119 mg/dL — ABNORMAL HIGH (ref 70–99)
Potassium: 3.7 mEq/L (ref 3.5–5.1)
Sodium: 137 mEq/L (ref 135–145)
Total Protein: 6.9 g/dL (ref 6.0–8.3)

## 2010-12-14 LAB — CULTURE, BLOOD (ROUTINE X 2)
Culture  Setup Time: 201203091019
Culture: NO GROWTH

## 2010-12-15 LAB — GLUCOSE, CAPILLARY: Glucose-Capillary: 138 mg/dL — ABNORMAL HIGH (ref 70–99)

## 2010-12-15 LAB — COMPREHENSIVE METABOLIC PANEL
Albumin: 2.8 g/dL — ABNORMAL LOW (ref 3.5–5.2)
BUN: 15 mg/dL (ref 6–23)
Calcium: 9.3 mg/dL (ref 8.4–10.5)
Creatinine, Ser: 0.88 mg/dL (ref 0.4–1.2)
GFR calc Af Amer: >60 mL/min (ref >60)
Total Bilirubin: 0.4 mg/dL (ref 0.3–1.2)
Total Protein: 7.4 g/dL (ref 6.0–8.3)

## 2010-12-15 LAB — CBC
MCH: 29.7 pg (ref 26.0–34.0)
MCHC: 32.6 g/dL (ref 30.0–36.0)
Platelets: 296 10*3/uL (ref 150–400)

## 2010-12-19 ENCOUNTER — Ambulatory Visit (HOSPITAL_BASED_OUTPATIENT_CLINIC_OR_DEPARTMENT_OTHER): Payer: Medicare Other | Attending: Pulmonary Disease

## 2010-12-19 DIAGNOSIS — Z79899 Other long term (current) drug therapy: Secondary | ICD-10-CM | POA: Insufficient documentation

## 2010-12-19 DIAGNOSIS — Z6841 Body Mass Index (BMI) 40.0 and over, adult: Secondary | ICD-10-CM | POA: Insufficient documentation

## 2010-12-19 DIAGNOSIS — R259 Unspecified abnormal involuntary movements: Secondary | ICD-10-CM | POA: Insufficient documentation

## 2010-12-19 DIAGNOSIS — G4733 Obstructive sleep apnea (adult) (pediatric): Secondary | ICD-10-CM | POA: Insufficient documentation

## 2010-12-19 NOTE — Progress Notes (Signed)
Summary: pt in hospital  Phone Note Call from Patient   Caller: Other Relative/francis Summary of Call: francis states pt is in the hospital in [place]. Initial call taken by: Roe Coombs,  December 13, 2010 10:39 AM  Follow-up for Phone Call        dr Jens Som made aware Deliah Goody, RN  December 13, 2010 3:40 PM

## 2010-12-25 ENCOUNTER — Telehealth: Payer: Self-pay | Admitting: Pulmonary Disease

## 2010-12-25 DIAGNOSIS — G4733 Obstructive sleep apnea (adult) (pediatric): Secondary | ICD-10-CM

## 2010-12-25 DIAGNOSIS — R259 Unspecified abnormal involuntary movements: Secondary | ICD-10-CM

## 2010-12-25 DIAGNOSIS — Z6841 Body Mass Index (BMI) 40.0 and over, adult: Secondary | ICD-10-CM

## 2010-12-25 NOTE — Telephone Encounter (Signed)
Let her know sleep study showed higher pressure required on biPAP, have sent order to make change

## 2010-12-26 ENCOUNTER — Encounter: Payer: Self-pay | Admitting: Pulmonary Disease

## 2010-12-28 NOTE — Discharge Summary (Signed)
Chelsea [name], Chelsea [name]          ACCOUNT NO.:  [id number]  MEDICAL RECORD NO.:  [id number]           PATIENT TYPE:  I  LOCATION:  2925                         FACILITY:  [facility]  PHYSICIAN:  Peggye Pitt, M.D. DATE OF BIRTH:  Feb 10, 1966  DATE OF ADMISSION:  12/08/2010 DATE OF DISCHARGE:  12/15/2010                              DISCHARGE SUMMARY   PRIMARY CARE PHYSICIAN:  Dr. Tarri Fuller with [facility], the number is [phone].  PULMONOLOGIST:  Comer Locket Vassie Loll, MD  CARDIOLOGIST:  Madolyn Frieze. Jens Som, MD, [facility]  DISCHARGE DIAGNOSES: 1. Acute on chronic respiratory failure secondary to pulmonary edema,     obstructive sleep apnea, obesity hypoventilation syndrome, and     decompensated diastolic congestive heart failure. 2. Shock, resolved, likely combination of hypovolemia and     questionable sepsis, was pressor dependent at one point. 3. Acute renal failure, likely secondary to nephrotoxic medications in     presence of dehydration. 4. Morbid obesity. 5. Hypothyroidism. 6. Type 2 diabetes mellitus. 7. Hyperlipidemia. 8. History of chronic obstructive pulmonary disease.  DISCHARGE MEDICATIONS: 1. Amitriptyline 25 mg at bedtime. 2. Pulmicort 0.5 mg every 12 hours. 3. Lasix 40 mg twice daily. 4. Multivitamin 1 tablet daily. 5. Potassium chloride 20 mEq daily. 6. Advair 250/50 one puff b.i.d. 7. Aspirin 81 mg daily. 8. Celebrex 200 mg daily. 9. Neurontin 100 mg 3 capsules at bedtime. 10.Synthroid 150 mcg daily. 11.Meclizine 25 mg 4 times daily as needed for vertigo. 12.Metolazone 5 mg every 12 hours. 13.Omeprazole 25 mg daily. 14.Glucosamine/chondroitin 1 tablet twice daily. 15.Percocet 10/325 mg, to take 1-1/2 tablets every 6 hours as needed     for pain. 16.Spiriva 18 mcg daily. 17.Albuterol inhaler 1-2 puffs every 4 hours as needed for shortness     of breath.  DISPOSITION AND FOLLOWUP:  Chelsea [name] will be discharged home today in stable  condition.  Please note that Physical and Occupational Therapy believe that she is more appropriate for short-term SNF versus LTAC. Also, [facility] has reviewed her case and believes that she does not meet criteria for admission at their hospital and the patient adamantly refuses placement at skilled nursing facility, hence we will proceed with discharging her home today and maximizing home health therapies.  CONSULTATIONS THIS HOSPITALIZATION:  Dr. Delton Coombes with Pulmonary Critical Care.  IMAGES AND PROCEDURES: 1. Chest x-ray on March 9 that showed cardiomegaly and vascular     congestion.  Renal ultrasound on March 9 that showed no     hydronephrosis with normal-appearing kidneys. 2. CT scan of the head on March 9 that was normal. 3. Most recent chest x-ray on March 14 that showed improving pulmonary     edema.  HISTORY AND PHYSICAL:  For complete details, see dictation on March 9 by Dr. Toniann Fail, but in brief, Chelsea [name] is a 45 year old morbidly obese woman who presented to the hospital with confusion x1 day perfamily reports.  In the emergency department, she was noted to be as well hypotensive with acute renal insufficiency a creatinine of 4.4. Hence, we were called to admit her for further evaluation and management.  HOSPITAL COURSE BY PROBLEM: 1.  Acute on chronic respiratory failure.  This is multifactorial and     secondary to obstructive sleep apnea, obesity hypoventilation     syndrome as well as decompensated CHF and pulmonary edema as well     as COPD.  She has required BiPAP.  We     have strongly encouraged her to have a tracheostomy placed.     However, she adamantly refuses.  She will need to go home with     BiPAP and we will have case management arrange this. 2. Hypertension.  Blood pressure stabilized with replacement of stress     dose steroids.  She is now off steroids, off pressors, and her     volume has been replaced. 3. Delirium and toxic  metabolic encephalopathy have completely     resolved at this time.  We believe that this was likely secondary     to her acute renal insufficiency and decompensated respiratory     failure. 4. Acute renal insufficiency.  Again, this has resolved.  Her     creatinine on day of discharge is normal at 0.90.  We believe that     this was secondary to dehydration and decreased circulatory volume     in the presence of nephrotoxic medications. 5. Hypothyroidism.  She is instructed to continue her Synthroid. 6. Diabetes mellitus has been reasonably well control while in the     hospital.  VITAL SIGNS ON DAY OF DISCHARGE:  Blood pressure 107/65, heart rate 65, respirations 15, temperature of 98.1, and saturations 95% on 2 liters.     Peggye Pitt, M.D.     EH/MEDQ  D:  12/15/2010  T:  12/16/2010  Job:  [id number]  cc:   Hilda Blades, MD Madolyn Frieze Jens Som, MD, [facility] Leslye Peer, MD  Electronically Signed by Peggye Pitt M.D. on 12/28/2010 07:29:04 AM

## 2010-12-29 ENCOUNTER — Other Ambulatory Visit (INDEPENDENT_AMBULATORY_CARE_PROVIDER_SITE_OTHER): Payer: Medicare Other

## 2010-12-29 ENCOUNTER — Ambulatory Visit (INDEPENDENT_AMBULATORY_CARE_PROVIDER_SITE_OTHER): Payer: Medicare Other | Admitting: Pulmonary Disease

## 2010-12-29 ENCOUNTER — Encounter: Payer: Self-pay | Admitting: Pulmonary Disease

## 2010-12-29 VITALS — BP 110/70 | HR 81 | Temp 98.3°F | Ht 67.0 in | Wt 337.6 lb

## 2010-12-29 DIAGNOSIS — N19 Unspecified kidney failure: Secondary | ICD-10-CM

## 2010-12-29 DIAGNOSIS — E662 Morbid (severe) obesity with alveolar hypoventilation: Secondary | ICD-10-CM

## 2010-12-29 DIAGNOSIS — I509 Heart failure, unspecified: Secondary | ICD-10-CM

## 2010-12-29 DIAGNOSIS — I5032 Chronic diastolic (congestive) heart failure: Secondary | ICD-10-CM

## 2010-12-29 DIAGNOSIS — G4733 Obstructive sleep apnea (adult) (pediatric): Secondary | ICD-10-CM

## 2010-12-29 LAB — BASIC METABOLIC PANEL
CO2: 34 mEq/L — ABNORMAL HIGH (ref 19–32)
Calcium: 9.4 mg/dL (ref 8.4–10.5)
Chloride: 93 mEq/L — ABNORMAL LOW (ref 96–112)
Potassium: 3.8 mEq/L (ref 3.5–5.1)
Sodium: 137 mEq/L (ref 135–145)

## 2010-12-29 NOTE — Progress Notes (Signed)
  Subjective:    Patient ID: Chelsea [name], female    DOB: 1965-11-30, 45 y.o.   MRN: [id number]  HPI 44/F, morbidly obese for FU of CPAP, [facility]  On O2 since 2004 after cervical CA surgery, got ON CPAP around the same time - [facility] pt .  admitted to [facility] in January with pneumonia and respiratory failure. Required me[facility] ventilation at [facility] , then [facility], She was treated with antibiotics and diuretics with improvement. [facility]. She was weaned off BiPPA to O2 daytime & nocturnal CPAP. Echocardiogram in January of 2012 showed normal LV function, mild left atrial enlargement and mild aortic insufficiency. Labs were obtained on 2/14 and her BUN was 49 (up from 10 on 11/05/10); creatinine 1.3 (up from 0.72) and BNP was 84. Diuretics are being adjusted by cardiology.  Has gained 50 lbs since dc  Advair prior / spiriva started in [facility] - unclear if this is helping  Lisinopril noted on med review - denies cough  CXR - no effusions , improved from jan'12  12/29/2010 PSG reviewed >> Severe obstructive sleep apnea with hypopneas causing oxygen      desaturation and sleep fragmentation.  This was corrected by CPAP of 18 cm with a C-Flex of 3 cm with 2 L  of oxygen blended in.   3. Limb movements seemed to emerge and increase on CPAP  At the time of this study, she  weighed 322 pounds with a height of 5 feet 7 inches, BMI of 50 and neck  size of 18 inches.  Hospitalised 3/9-3/17/12 for acute renal failure (on toresemide & metalazone ) & hypercarbic resp failure   Review of Systems Pt denies any significant  nasal congestion or excess secretions, fever, chills, sweats, unintended wt loss, pleuritic or exertional cp, orthopnea pnd or leg swelling.  Pt also denies any obvious fluctuation in symptoms with weather or environmental change or other alleviating or aggravating factors.    Pt denies any increase in rescue therapy  over baseline, denies waking up needing it or having early am exacerbations or coughing/wheezing/ or dyspnea       Objective:   Physical Exam Gen. Pleasant,obese, in no distress, normal affect, on O2 ENT - no lesions, no post nasal drip Neck: No JVD, no thyromegaly, no carotid bruits Lungs: no use of accessory muscles, no dullness to percussion, decreassed BL  without rales or rhonchi  Cardiovascular: Rhythm regular, heart sounds  normal, no murmurs or gallops, 1+ peripheral edema Abdomen: soft and non-tender, no hepatosplenomegaly, BS normal. Musculoskeletal: No deformities, no cyanosis or clubbing Neuro:  alert, non focal         Assessment & Plan:

## 2010-12-29 NOTE — Patient Instructions (Signed)
We will increase your BiPAP level to 15/10 If tolerated call us back & we can increase it further Blood work today to check kidney function Check on oxygen tanks with [facility] Pt Pulmonary rehab referral

## 2010-12-29 NOTE — Telephone Encounter (Signed)
Pt advised during today's office visit. Julaine Hua, CMA

## 2011-01-01 DIAGNOSIS — E662 Morbid (severe) obesity with alveolar hypoventilation: Secondary | ICD-10-CM | POA: Insufficient documentation

## 2011-01-01 NOTE — Assessment & Plan Note (Signed)
pulm rehab referral She will discuss larger O2 tanks with DME

## 2011-01-01 NOTE — H&P (Signed)
Chelsea [name], Chelsea [name]          ACCOUNT NO.:  [id number]  MEDICAL RECORD NO.:  [id number]           PATIENT TYPE:  E  LOCATION:  [facility]                         FACILITY:  [facility]  PHYSICIAN:  Eduard Clos, MDDATE OF BIRTH:  1965-10-05  DATE OF ADMISSION:  12/08/2010 DATE OF DISCHARGE:                             HISTORY & PHYSICAL   PRIMARY CARE PHYSICIAN:  Unassigned.  CHIEF COMPLAINT:  Altered mental status, confusion.  HISTORY OF PRESENT ILLNESS:  This is a 45 year old female with known history of morbid obesity, OSA, diabetes mellitus type 2, hypertension, chronic lower extremity stasis dermatitis, and hyperlipidemia who was in the hospital at [facility] for sepsis and acute respiratory failure.  Was brought to [facility] for rehab.  Was just discharged a week ago.  Was sent home, and yesterday, she was found to be very confused by the patient's brother who brought the patient to the ER.  In the ER, at this time, ABG shows pCO2 of 60, and the patient was placed on BiPAP, has become more alert now.  In addition, the patient is found to be hypotensive and the creatinine has gone up to 4.4, and a week ago, it was 1.2.  At this time, patient stated the only thing she has is mild pain in both lower extremities.  The brother is stating that the lower extremities erythema is worsened than usual.  The patient denies any chest pain or any nausea, vomiting, abdominal pain, any dysuria/diarrhea, any headache or any focal deficit.  Denies any fever or chills.  Patient states she is very drowsy.  Denies any shortness of breath, any cough or phlegm.  PAST MEDICAL HISTORY:  Recently admitted at [facility] for sepsis and acute respiratory failure.  We are going to try to get the records from them.  History of diabetes mellitus type 2, OSA, morbid obesity, hyperlipidemia, history of cervical cancer status post hysterectomy, history of COPD,  and CHF.  MEDICATIONS PRIOR TO ADMISSION:  We do not have a complete list, includes Advair Diskus, aspirin, Celebrex, doxycycline, Endocet, estradiol, famotidine, gabapentin, glucosamine, Klor-Con, Lasix, levothyroxine, lisinopril, meclizine, metolazone, metoprolol, multivitamin, omeprazole.  ALLERGIES:  CODEINE.  FAMILY HISTORY:  Nothing contributory.  SOCIAL HISTORY:  The patient lives alone, is frequented by her family. Denies any cigarette smoking, drinking alcohol, or drug abuse.  REVIEW OF SYSTEMS:  As per history of present illness, nothing else significant.  PHYSICAL EXAMINATION:  GENERAL:  The patient examined at bedside, not in acute distress. VITAL SIGNS:  Blood pressure is 86/22, pulse is 80 per minute, temperature 97.7, respirations 14 on BiPAP, and O2 sat is 100%.  HEENT: Anicteric.  No pallor.  No discharge from ears.  Eyes normal. CHEST:  Bilateral entry present.  No rhonchi, no crepitation. HEART:  S1 and S2 heard. ABDOMEN:  Soft, nontender.  Bowel sounds heard. CNS:  The patient is drowsy, arousable, and follows commands.  Moves upper and lower extremities. EXTREMITIES:  There is 1 to 2+ plus nonpitting edema with erythema extending from the ankle to the midcalf, both lower extremities and I do not see any acute  ischemic changes, clubbing or cyanosis.  LABORATORY DATA:  EKG shows normal sinus rhythm with nonspecific T-wave changes, particularly in the anterior leads.  There are no old EKGs to compare.  CBC:  WBCs 12.5, hemoglobin is 9.1, hematocrit is 29.5, and platelets 222.  Complete metabolic panel:  Sodium 134, potassium 4.8, chloride 91, carbon dioxide 29, glucose 94, BUN 86, creatinine 4.4, AST 37, ALT 17, total protein 6.8, albumin 3.2, calcium 8.4, pro-calcitonin 0.1, lactic acid 1, lipase 17, and BNP less than 30.  Alcohol level less than 5.  ASSESSMENT: 1. Altered mental status, most likely from hypercarbic respiratory     failure. 2.  Hypotension. 3. Acute renal failure. 4. Recent sepsis. 5. Lower extremity stasis dermatitis versus developing cellulitis. 6. Obstructive sleep apnea. 7. Obesity. 8. Anemia.  PLAN: 1. At this time, we will admit patient to the ICU. 2. For her altered mental status and hypercarbic respiratory failure,     the patient is placed on BiPAP which we will continue and recheck     maybe in another one hour.  The patient will be on nebulizers and     Pulmicort.  I will let Critical Care know about the patient's     admission and observation. 3. Acute renal failure and hypotension.  At this time, patient is     hypotensive and creatinine has increased remarkably.  I am going to     check a urine sodium and creatinine and eosinophil, put a Foley     catheter, strict intake/output, and I am going to give 2 L bolus at     this time of normal saline and if needed, further boluses.     Continue IV fluid infusion and closely follow.  Not to overload the     patient.  Get a 2-D echo.  If the patient is not making enough     urine, may need a renal sonogram to make sure there is no     obstruction. 4. Anemia.  We will check Hemoccult blood and anemia profile. 5. Lower extremity erythema.  At this time, it is concerning for     cellulitis.  We will restart empiric antibiotic which has already     been started.  We will get a Doppler of the lower extremity. 6. Further recommendations based on the patient's clinical course.     Eduard Clos, MD     ANK/MEDQ  D:  12/08/2010  T:  12/08/2010  Job:  [id number]  Electronically Signed by Midge Minium MD on 01/01/2011 07:45:42 AM

## 2011-01-01 NOTE — Assessment & Plan Note (Addendum)
increase BiPAP level to 15/10 with goal of 18/15  Weight loss encouraged, compliance with goal of at least 4-6 hrs every night is the expectation. Advised against medications with sedative side effects Cautioned against driving when sleepy - understanding that sleepiness will vary on a day to day basis

## 2011-01-01 NOTE — Assessment & Plan Note (Signed)
chk BMET for cr on diuretics esp given recent episode of ARF

## 2011-01-02 ENCOUNTER — Telehealth: Payer: Self-pay | Admitting: *Deleted

## 2011-01-02 NOTE — Telephone Encounter (Signed)
Message copied by Zackery Barefoot on Tue Jan 02, 2011 11:13 AM ------      Message from: Cyril Mourning      Created: Mon Jan 01, 2011 10:53 AM       Renal func tion & potassium OK,  rechk in 7-10 days with PCP or cards

## 2011-01-03 NOTE — Telephone Encounter (Signed)
Pt informed of RA recs and Pt verbalized understanding  

## 2011-01-11 ENCOUNTER — Encounter: Payer: Self-pay | Admitting: Pulmonary Disease

## 2011-01-15 ENCOUNTER — Ambulatory Visit (INDEPENDENT_AMBULATORY_CARE_PROVIDER_SITE_OTHER): Payer: Medicare Other | Admitting: Cardiology

## 2011-01-15 ENCOUNTER — Encounter: Payer: Self-pay | Admitting: Cardiology

## 2011-01-15 DIAGNOSIS — I10 Essential (primary) hypertension: Secondary | ICD-10-CM

## 2011-01-15 DIAGNOSIS — I509 Heart failure, unspecified: Secondary | ICD-10-CM

## 2011-01-15 DIAGNOSIS — I5032 Chronic diastolic (congestive) heart failure: Secondary | ICD-10-CM

## 2011-01-15 NOTE — Progress Notes (Signed)
HPI: Chelsea [name] is a 45 year old female with diastolic CHF. She was admitted to [facility] in January 2012 with pneumonia and respiratory failure. She was treated with antibiotics and diuretics with improvement. She was transferred to selective medical rehabilitation services at [facility] for rehabilitation. She has had issues with not understanding her medications and taking varying amounts of diuretics. Admitted in March 2012 with acute respiratory insufficiency which was felt to be multifactorial and included obstructive sleep apnea and congestive heart failure. Also in acute renal failure and required pressors at one point. Echocardiogram was repeated and showed an ejection fraction of 60-65% and a mildly elevated LVOT gradient felt secondary to hyperdynamic function. There was a mildly elevated gradient in her thoracic aorta. There was trace AI. She improved with therapy. Since discharge she appears to be doing well. She has some dyspnea on exertion but denies orthopnea, PND, chest pain or syncope. Her pedal edema is controlled her present dose of diuretics.  Current Outpatient Prescriptions  Medication Sig Dispense Refill  . Albuterol Sulfate (VENTOLIN HFA IN) As directed as needed       . aspirin 81 MG tablet Take 81 mg by mouth daily.        . celecoxib (CELEBREX) 200 MG capsule Take 200 mg by mouth daily. Or as directed      . estrogens, conjugated, (PREMARIN) 0.9 MG tablet Take 0.9 mg by mouth daily. Take daily for 21 days then do not take for 7 days.      . famotidine (PEPCID) 20 MG tablet Take 20 mg by mouth 2 (two) times daily.        . Fluticasone-Salmeterol (ADVAIR DISKUS) 500-50 MCG/DOSE AEPB Inhale 1 puff into the lungs every 12 (twelve) hours.        . formoterol (PERFOROMIST) 20 MCG/2ML nebulizer solution Take 20 mcg by nebulization. As directed       . furosemide (LASIX) 40 MG tablet Take 40 mg by mouth 2 (two) times daily.        Marland Kitchen gabapentin  (NEURONTIN) 100 MG capsule 3 at bedtime       . glucosamine-chondroitin 500-400 MG tablet Take 1 tablet by mouth 2 (two) times daily.        . insulin detemir (LEVEMIR) 100 UNIT/ML injection As directed       . levothyroxine (SYNTHROID, LEVOTHROID) 150 MCG tablet Take 150 mcg by mouth daily.        . metolazone (ZAROXOLYN) 5 MG tablet Take 5 mg by mouth 2 (two) times daily.        . Multiple Vitamin (MULTIVITAMIN) tablet Take 1 tablet by mouth daily.        Marland Kitchen omeprazole (PRILOSEC) 20 MG capsule Take 20 mg by mouth daily.        Marland Kitchen oxyCODONE-acetaminophen (PERCOCET) 10-325 MG per tablet Take 1 tablet by mouth every 4 (four) hours as needed.        . potassium chloride SA (K-DUR,KLOR-CON) 20 MEQ tablet Take 20 mEq by mouth 3 (three) times daily.        . temazepam (RESTORIL) 30 MG capsule Take 30 mg by mouth at bedtime as needed.        . tiotropium (SPIRIVA) 18 MCG inhalation capsule Place 18 mcg into inhaler and inhale daily.        Marland Kitchen DISCONTD: amitriptyline (ELAVIL) 25 MG tablet Take 25 mg by mouth at bedtime.        Marland Kitchen DISCONTD: ciprofloxacin (CIPRO)  500 MG tablet Take 500 mg by mouth 2 (two) times daily.        Marland Kitchen DISCONTD: doxycycline (VIBRAMYCIN) 100 MG capsule Take 100 mg by mouth 2 (two) times daily.        Marland Kitchen DISCONTD: estradiol (ESTRACE) 2 MG tablet Take 2 mg by mouth daily.        Marland Kitchen DISCONTD: meclizine (ANTIVERT) 25 MG tablet Take 25 mg by mouth 4 (four) times daily as needed.           Past Medical History  Diagnosis Date  . GERD (gastroesophageal reflux disease)   . Hypothyroid   . Hypertension   . OSA (obstructive sleep apnea)   . Obesity hypoventilation syndrome   . Diastolic congestive heart failure   . Cervical cancer     Past Surgical History  Procedure Date  . Vesicovaginal fistula closure w/ tah   . Cholecystectomy   . Tonsillectomy     History   Social History  . Marital Status: Single    Spouse Name: N/A    Number of Children: N/A  . Years of Education: N/A    Occupational History  . disabled    Social History Main Topics  . Smoking status: Never Smoker   . Smokeless tobacco: Not on file  . Alcohol Use: 0.0 oz/week  . Drug Use: Not on file  . Sexually Active: Not on file   Other Topics Concern  . Not on file   Social History Narrative  . No narrative on file    ROS: no fevers or chills, productive cough, hemoptysis, dysphasia, odynophagia, melena, hematochezia, dysuria, hematuria, rash, seizure activity, orthopnea, PND, pedal edema, claudication. Remaining systems are negative.  Physical Exam: Well-developed obese in no acute distress.  Skin is warm and dry.  HEENT is normal.  Neck is supple. No thyromegaly.  Chest is clear to auscultation with normal expansion.  Cardiovascular exam is regular rate and rhythm.  Abdominal exam nontender or distended. No masses palpated. Extremities show 1+ ankle edema and chronic skin changes neuro grossly intact

## 2011-01-15 NOTE — Assessment & Plan Note (Signed)
Patient appears to be doing well. Continue present dose of diuretics. Her potassium and renal function are being monitored by her primary care physician. We discussed the importance of low sodium diet fluid restriction. She will also weigh herself daily and she can take an additional 40 mg of Lasix daily as needed.

## 2011-01-15 NOTE — Patient Instructions (Signed)
Your physician recommends that you schedule a follow-up appointment in 3 MONTHS. 

## 2011-01-15 NOTE — Assessment & Plan Note (Signed)
Blood pressure controlled. Continue present medications. 

## 2011-02-19 ENCOUNTER — Ambulatory Visit: Payer: Self-pay | Admitting: Pulmonary Disease

## 2011-02-20 ENCOUNTER — Encounter: Payer: Self-pay | Admitting: Cardiology

## 2011-02-20 ENCOUNTER — Telehealth: Payer: Self-pay | Admitting: Pulmonary Disease

## 2011-02-20 NOTE — Telephone Encounter (Signed)
Pt advised I do not see any note where someone tried to contact her.Carron Curie, CMA

## 2011-02-21 ENCOUNTER — Encounter: Payer: Self-pay | Admitting: Cardiology

## 2011-02-21 ENCOUNTER — Inpatient Hospital Stay (HOSPITAL_COMMUNITY)
Admission: AD | Admit: 2011-02-21 | Discharge: 2011-02-26 | DRG: 292 | Disposition: A | Discharge status: Home Health | Payer: Medicare Other | Source: Ambulatory Visit | Attending: Cardiology | Admitting: Cardiology

## 2011-02-21 ENCOUNTER — Inpatient Hospital Stay (HOSPITAL_COMMUNITY): Payer: Medicare Other

## 2011-02-21 ENCOUNTER — Ambulatory Visit (INDEPENDENT_AMBULATORY_CARE_PROVIDER_SITE_OTHER): Payer: Medicare Other | Admitting: Cardiology

## 2011-02-21 DIAGNOSIS — E662 Morbid (severe) obesity with alveolar hypoventilation: Secondary | ICD-10-CM | POA: Diagnosis present

## 2011-02-21 DIAGNOSIS — I5033 Acute on chronic diastolic (congestive) heart failure: Secondary | ICD-10-CM

## 2011-02-21 DIAGNOSIS — E785 Hyperlipidemia, unspecified: Secondary | ICD-10-CM | POA: Diagnosis present

## 2011-02-21 DIAGNOSIS — D649 Anemia, unspecified: Secondary | ICD-10-CM | POA: Diagnosis present

## 2011-02-21 DIAGNOSIS — E039 Hypothyroidism, unspecified: Secondary | ICD-10-CM | POA: Diagnosis present

## 2011-02-21 DIAGNOSIS — I509 Heart failure, unspecified: Secondary | ICD-10-CM | POA: Diagnosis present

## 2011-02-21 DIAGNOSIS — J449 Chronic obstructive pulmonary disease, unspecified: Secondary | ICD-10-CM | POA: Diagnosis present

## 2011-02-21 DIAGNOSIS — E119 Type 2 diabetes mellitus without complications: Secondary | ICD-10-CM | POA: Diagnosis present

## 2011-02-21 DIAGNOSIS — I5023 Acute on chronic systolic (congestive) heart failure: Principal | ICD-10-CM | POA: Diagnosis present

## 2011-02-21 DIAGNOSIS — K219 Gastro-esophageal reflux disease without esophagitis: Secondary | ICD-10-CM | POA: Diagnosis present

## 2011-02-21 DIAGNOSIS — J4489 Other specified chronic obstructive pulmonary disease: Secondary | ICD-10-CM | POA: Diagnosis present

## 2011-02-21 DIAGNOSIS — I359 Nonrheumatic aortic valve disorder, unspecified: Secondary | ICD-10-CM | POA: Diagnosis present

## 2011-02-21 DIAGNOSIS — I10 Essential (primary) hypertension: Secondary | ICD-10-CM | POA: Diagnosis present

## 2011-02-21 DIAGNOSIS — Z7982 Long term (current) use of aspirin: Secondary | ICD-10-CM

## 2011-02-21 DIAGNOSIS — I2510 Atherosclerotic heart disease of native coronary artery without angina pectoris: Secondary | ICD-10-CM | POA: Diagnosis present

## 2011-02-21 DIAGNOSIS — G4733 Obstructive sleep apnea (adult) (pediatric): Secondary | ICD-10-CM | POA: Diagnosis present

## 2011-02-21 LAB — PRO B NATRIURETIC PEPTIDE: Pro B Natriuretic peptide (BNP): 442.1 pg/mL — ABNORMAL HIGH (ref 0–125)

## 2011-02-21 LAB — COMPREHENSIVE METABOLIC PANEL
ALT: 15 U/L (ref 0–35)
AST: 19 U/L (ref 0–37)
Alkaline Phosphatase: 76 U/L (ref 39–117)
CO2: 30 mEq/L (ref 19–32)
Calcium: 9.5 mg/dL (ref 8.4–10.5)
Chloride: 97 mEq/L (ref 96–112)
GFR calc Af Amer: >60 mL/min (ref >60)
GFR calc non Af Amer: >60 mL/min (ref >60)
Glucose, Bld: 119 mg/dL — ABNORMAL HIGH (ref 70–99)
Potassium: 4.1 mEq/L (ref 3.5–5.1)
Sodium: 136 mEq/L (ref 135–145)
Total Bilirubin: 0.3 mg/dL (ref 0.3–1.2)

## 2011-02-21 LAB — CBC
HCT: 31.7 % — ABNORMAL LOW (ref 36.0–46.0)
Hemoglobin: 10.2 g/dL — ABNORMAL LOW (ref 12.0–15.0)
MCH: 29.2 pg (ref 26.0–34.0)
RBC: 3.49 MIL/uL — ABNORMAL LOW (ref 3.87–5.11)

## 2011-02-21 LAB — GLUCOSE, CAPILLARY: Glucose-Capillary: 106 mg/dL — ABNORMAL HIGH (ref 70–99)

## 2011-02-21 LAB — PROTIME-INR: Prothrombin Time: 13.7 seconds (ref 11.6–15.2)

## 2011-02-21 NOTE — Assessment & Plan Note (Signed)
Patient presents with progressive heart failure symptoms. He is volume overloaded on examination. Plan to admit for IV diuresis. Follow renal function closely. Repeat echocardiogram both for LV function and aortic stenosis. Also note patient placed on minoxidil recently for hair loss. This certainly could be contributing to fluid retention. This medication will be discontinued.

## 2011-02-21 NOTE — Progress Notes (Signed)
[id number] Chelsea [name] is a 45 year old female with diastolic CHF. She was admitted to [facility] in January 2012 with pneumonia and respiratory failure. She was treated with antibiotics and diuretics with improvement. She was transferred to selective medical rehabilitation services at [facility] for rehabilitation. She has had issues with not understanding her medications and taking varying amounts of diuretics. Admitted in March 2012 with acute respiratory insufficiency which was felt to be multifactorial and included obstructive sleep apnea and congestive heart failure. Also in acute renal failure and required pressors at one point. Echocardiogram was repeated and showed an ejection fraction of 60-65% and a mildly elevated LVOT gradient felt secondary to hyperdynamic function or mild AS. There was a mildly elevated gradient in her thoracic aorta. There was trace AI. She improved with therapy. She now presents with complaints of progressive weight gain of 37 pounds, increased dyspnea on exertion and pedal edema. There is no chest pain, palpitations or syncope. No fevers, chills or productive cough.   Current Outpatient Prescriptions  Medication Sig Dispense Refill  . Albuterol Sulfate (VENTOLIN HFA IN) As directed as needed       . aspirin 81 MG tablet Take 81 mg by mouth daily.        . celecoxib (CELEBREX) 200 MG capsule Take 200 mg by mouth daily. Or as directed      . doxycycline (DORYX) 100 MG DR capsule Take 100 mg by mouth 2 (two) times daily.        . famotidine (PEPCID) 20 MG tablet Take 20 mg by mouth 2 (two) times daily.        . ferrous sulfate 325 (65 FE) MG tablet Take 325 mg by mouth 2 (two) times daily.        . Fluticasone-Salmeterol (ADVAIR DISKUS) 500-50 MCG/DOSE AEPB Inhale 1 puff into the lungs every 12 (twelve) hours.        . formoterol (PERFOROMIST) 20 MCG/2ML nebulizer solution Take 20 mcg by nebulization. As directed       . furosemide (LASIX) 40 MG  tablet Take 40 mg by mouth 2 (two) times daily.        Marland Kitchen gabapentin (NEURONTIN) 100 MG capsule 3 at bedtime       . glucosamine-chondroitin 500-400 MG tablet Take 1 tablet by mouth 2 (two) times daily.        . insulin detemir (LEVEMIR) 100 UNIT/ML injection As directed       . levothyroxine (SYNTHROID, LEVOTHROID) 150 MCG tablet Take 150 mcg by mouth daily.        . metolazone (ZAROXOLYN) 5 MG tablet Take 5 mg by mouth 2 (two) times daily.        . minoxidil (LONITEN) 10 MG tablet Take 10 mg by mouth daily.        . Multiple Vitamin (MULTIVITAMIN) tablet Take 1 tablet by mouth daily.        . NON FORMULARY oxige 2 1/2 litt       . omeprazole (PRILOSEC) 20 MG capsule Take 20 mg by mouth daily.        Marland Kitchen oxyCODONE-acetaminophen (PERCOCET) 10-325 MG per tablet Take 1 tablet by mouth every 4 (four) hours as needed.        . potassium chloride SA (K-DUR,KLOR-CON) 20 MEQ tablet Take 20 mEq by mouth 3 (three) times daily.        . temazepam (RESTORIL) 30 MG capsule Take 30 mg by mouth at bedtime as needed.        Marland Kitchen  tiotropium (SPIRIVA) 18 MCG inhalation capsule Place 18 mcg into inhaler and inhale daily.        Marland Kitchen DISCONTD: estrogens, conjugated, (PREMARIN) 0.9 MG tablet Take 0.9 mg by mouth daily. Take daily for 21 days then do not take for 7 days.         Past Medical History  Diagnosis Date  . GERD (gastroesophageal reflux disease)   . Hypothyroid   . Hypertension   . OSA (obstructive sleep apnea)   . Obesity hypoventilation syndrome   . Diastolic congestive heart failure   . Cervical cancer   . Diabetes mellitus   . Hyperlipidemia   . Aortic stenosis   . COPD (chronic obstructive pulmonary disease)     Past Surgical History  Procedure Date  . Cholecystectomy   . Tonsillectomy   . Abdominal hysterectomy     History   Social History  . Marital Status: Single    Spouse Name: N/A    Number of Children: N/A  . Years of Education: N/A   Occupational History  . disabled     Social History Main Topics  . Smoking status: Never Smoker   . Smokeless tobacco: Not on file  . Alcohol Use: 0.0 oz/week     Rarely  . Drug Use: Not on file  . Sexually Active: Not on file   Other Topics Concern  . Not on file   Social History Narrative  . No narrative on file    ROS: no fevers or chills, productive cough, hemoptysis, dysphasia, odynophagia, melena, hematochezia, dysuria, hematuria, rash, seizure activity, orthopnea, PND, pedal edema, claudication. Remaining systems are negative.  Physical Exam: Well-developed obese in mild distress.  Skin is warm and dry.  HEENT is normal.  Neck is supple. No thyromegaly. JVD difficult to assess secondary to obesity. Chest is clear to auscultation with normal expansion.  Cardiovascular exam is regular rate and rhythm. 2-3 / 6 SM left sternal border radiating to carotids Abdominal exam nontender or distended. No masses palpated. Extremities show 2-3+ edema and chronic skin changes. neuro grossly intact  ECG Normal sinus rhythm at a rate of 79. No significant ST changes.

## 2011-02-21 NOTE — Patient Instructions (Signed)
You are being admitted to [facility] today. 

## 2011-02-21 NOTE — Assessment & Plan Note (Signed)
Watch blood pressure off of minoxidil and add medications as needed.

## 2011-02-22 LAB — GLUCOSE, CAPILLARY
Glucose-Capillary: 122 mg/dL — ABNORMAL HIGH (ref 70–99)
Glucose-Capillary: 91 mg/dL (ref 70–99)

## 2011-02-22 LAB — TSH: TSH: 1.029 u[IU]/mL (ref 0.350–4.500)

## 2011-02-22 LAB — BASIC METABOLIC PANEL
CO2: 34 mEq/L — ABNORMAL HIGH (ref 19–32)
Chloride: 97 mEq/L (ref 96–112)
Creatinine, Ser: 0.94 mg/dL (ref 0.4–1.2)
GFR calc Af Amer: >60 mL/min (ref >60)
Sodium: 139 mEq/L (ref 135–145)

## 2011-02-23 LAB — GLUCOSE, CAPILLARY: Glucose-Capillary: 94 mg/dL (ref 70–99)

## 2011-02-23 LAB — BASIC METABOLIC PANEL
BUN: 23 mg/dL (ref 6–23)
Chloride: 96 mEq/L (ref 96–112)
Creatinine, Ser: 0.77 mg/dL (ref 0.4–1.2)
Glucose, Bld: 93 mg/dL (ref 70–99)
Potassium: 3.6 mEq/L (ref 3.5–5.1)

## 2011-02-24 LAB — BASIC METABOLIC PANEL
BUN: 23 mg/dL (ref 6–23)
CO2: 41 mEq/L (ref 19–32)
Calcium: 9.7 mg/dL (ref 8.4–10.5)
Creatinine, Ser: 0.81 mg/dL (ref 0.4–1.2)
GFR calc non Af Amer: >60 mL/min (ref >60)
Glucose, Bld: 88 mg/dL (ref 70–99)

## 2011-02-24 LAB — GLUCOSE, CAPILLARY
Glucose-Capillary: 89 mg/dL (ref 70–99)
Glucose-Capillary: 87 mg/dL (ref 70–99)
Glucose-Capillary: 86 mg/dL (ref 70–99)

## 2011-02-25 LAB — GLUCOSE, CAPILLARY
Glucose-Capillary: 94 mg/dL (ref 70–99)
Glucose-Capillary: 88 mg/dL (ref 70–99)
Glucose-Capillary: 109 mg/dL — ABNORMAL HIGH (ref 70–99)

## 2011-02-25 LAB — BASIC METABOLIC PANEL
BUN: 28 mg/dL — ABNORMAL HIGH (ref 6–23)
GFR calc Af Amer: >60 mL/min (ref >60)
GFR calc non Af Amer: >60 mL/min (ref >60)
Potassium: 3.3 mEq/L — ABNORMAL LOW (ref 3.5–5.1)
Sodium: 140 mEq/L (ref 135–145)

## 2011-02-26 LAB — BASIC METABOLIC PANEL
BUN: 34 mg/dL — ABNORMAL HIGH (ref 6–23)
Chloride: 91 mEq/L — ABNORMAL LOW (ref 96–112)
Potassium: 3.9 mEq/L (ref 3.5–5.1)

## 2011-02-26 LAB — GLUCOSE, CAPILLARY: Glucose-Capillary: 98 mg/dL (ref 70–99)

## 2011-02-28 ENCOUNTER — Other Ambulatory Visit: Payer: Medicare Other | Admitting: *Deleted

## 2011-03-02 ENCOUNTER — Other Ambulatory Visit: Payer: Medicare Other | Admitting: *Deleted

## 2011-03-02 NOTE — Discharge Summary (Addendum)
Chelsea [name], Chelsea [name]          ACCOUNT NO.:  [id number]  MEDICAL RECORD NO.:  [id number]           PATIENT TYPE:  I  LOCATION:  4728                         FACILITY:  [facility]  PHYSICIAN:  Madolyn Frieze. Jens Som, MD, FACCDATE OF BIRTH:  06/01/1966  DATE OF ADMISSION:  02/21/2011 DATE OF DISCHARGE:  02/26/2011                              DISCHARGE SUMMARY   DISCHARGE DIAGNOSES: 1. Acute on chronic diastolic heart failure.     a.     Discharge weight 147.5 kg.     b.     Volume component may have been related to minoxidil, which      has been discontinued.     c.     Ejection fraction of 55%-60% by echo Feb 21, 2011, with      grade 2 diastolic dysfunction. 2. Aortic peak gradient 23 mmHg of turbulence in the area of the     aortic isthmus, Dr. Jens Som to arrange outpatient cardiac MRI once     renal function is stable. 3. Mild-to-moderate atherosclerosis, for Dr. Jens Som to arrange     outpatient cardiac MRI to access aortic valve. 4. Morbid obesity. 5. Hyperthyroidism. 6. Type 2 diabetes mellitus, insulin dependent. 7. Hypertension. 8. Obstructive sleep apnea. 9. Obesity hypoventilation syndrome. 10.Hyperlipidemia. 11.History of chronic obstructive  pulmonary disease. 12.Gastroesophageal reflux disease. 13.Cervical cancer. 14.Cholecystectomy. 15.Tonsillectomy. 16.Abdominal hysterectomy. 17.Shock and acute chronic respiratory failure March 2012 in the     setting of pulmonary edema, obstructive sleep apnea, obesity,     hypoventilation syndrome, and decompensated diastolic heart     failure.  HOSPITAL COURSE:  Chelsea [name] is a 45 year old female with a history of morbid obesity, diabetes mellitus, COPD, and diastolic heart failure who was seen in the office by Dr. Jens Som with complaints of a 37-pound weight gain, dyspnea on exertion, pedal edema.  She denied any chest pain, palpitations or syncope.  She was noted to recently been started on minoxidil for alopecia.   This was felt to have some component of volume retention properties.  So it was discontinued on admission to the hospital where she was admitted for IV diuresis.  She was also continued on her Zaroxolyn concurrently which helped to facilitate diuresis.  She received 80 mg of IV b.i.d. of Lasix up until this was changed to a p.o. dosing on Feb 26, 2011.  Her weight went from 165.4 to 147.5 kg.  A 2D cardiogram was obtained with below findings.  Dr. Jens Som planned to consider an outpatient cardiac MRI once her renal function was stable. She was assessed for aortic valve and to rule out coarctation.  On day of discharge, the patient was feeling well and was eager to go home. She has diuresed well.  Dr. Jens Som has seen and examined her today and feels she is stable for discharge.  DISCHARGE LABORATORY FINDINGS:  Sodium 138, potassium 3.9, chloride 91, C02 39, glucose 97, BUN 34, creatinine 1.10, TSH 1.029.  STUDIES: 1. Chest x-ray Feb 21, 2011, showed cardiomegaly with pulmonary     vascular congestion consistent with CHF. 2. 2D echocardiogram Feb 21, 2011, showed EF 55%-60%.  No regional  wall motion abnormalities.  Grade 2 diastolic dysfunction.  Aortic     valve mean gradient consistent with mild-to-moderate AS though     valve repaired, open wall.  Mean gradient 23, peak gradient 36.     Aorta showed peak gradient of 23 mmHg turbulence in the area of the     aortic isthmus.  We would suggest MR angiogram for evaluation of     claudication.  No significant MR.  IVC was dilated to 3 cm with     minimal respirophasic variation, suggesting RA pressure of 20 mmHg.  DISCHARGE MEDICATIONS: 1. Advair Diskus 250/50 one puff inhaled b.i.d. 2. Aspirin 81 mg daily. 3. Celebrex 200 mg daily. 4. Dicyclomine 100 mg b.i.d. 5. Ferrous sulfate 325 mg b.i.d. with meals. 6. Lasix 40 mg b.i.d. 7. Glucosamine and chondroitin 1500 mg/1200 mg 1 tablet b.i.d. 8. Klor-Con 20 mEq b.i.d. 9. Levemir  65 units nightly subcutaneously. 10.Levothyroxine 150 mcg daily. 11.Multivitamin 1 tablet daily. 12.Omeprazole 1 capsule daily. 13.Percocet 10/325 mg 2 tablets q.6 h. p.r.n. pain. 14.Perforomist nebulizer 1 inhalation inhaled q.i.d. p.r.n.     breathing/shortness of breath. 15.Spiriva 18 mcg inhaled daily. 16.Temazepam 30 mg nightly p.r.n. sleep. 17.Albuterol 1 puff inhaled q.4 h. p.r.n.  Please note, metaxalone as well as minoxidil were both discontinued this admission.  Minoxidil was inadvertently left off from med list.  To begin with, the patient was specifically instructed on her discharge instructions to discontinue this.  DISPOSITION:  Chelsea [name] will be discharged in stable condition to home and is instructed to follow a low-salt heart-healthy diet.  She was given exclusive instructions regarding the heart failure, instructions on the back of the pink discharge sheet.  She will follow up with [facility] as requested by Dr. Jens Som.  Office is closed today, but I have left voice mail for her to be scheduled for a BMET Wednesday Feb 28, 2011, follow up with Tereso Newcomer Friday March 02, 2011, and to follow up with Dr. Jens Som in 2 weeks.  The patient will be called with these appointments.  DURATION OF DISCHARGE ENCOUNTER:  Greater than 30 minutes including physician and PA time.     Dayna Dunn, P.A.C.   ______________________________ Madolyn Frieze. Jens Som, MD, [facility]    DD/MEDQ  D:  02/26/2011  T:  02/26/2011  Job:  [id number]  cc:   Madolyn Frieze. Jens Som, MD, [facility]  Electronically Signed by Ronie Spies  on 03/02/2011 01:45:33 PM Electronically Signed by Olga Millers MD [facility] on 03/02/2011 04:54:30 PM

## 2011-03-14 ENCOUNTER — Encounter: Payer: Self-pay | Admitting: Physician Assistant

## 2011-03-14 ENCOUNTER — Ambulatory Visit (INDEPENDENT_AMBULATORY_CARE_PROVIDER_SITE_OTHER): Payer: Medicare Other | Admitting: Physician Assistant

## 2011-03-14 VITALS — BP 100/60 | HR 70 | Resp 20 | Ht 67.0 in | Wt 332.4 lb

## 2011-03-14 DIAGNOSIS — I350 Nonrheumatic aortic (valve) stenosis: Secondary | ICD-10-CM | POA: Insufficient documentation

## 2011-03-14 DIAGNOSIS — I5033 Acute on chronic diastolic (congestive) heart failure: Secondary | ICD-10-CM

## 2011-03-14 DIAGNOSIS — M25572 Pain in left ankle and joints of left foot: Secondary | ICD-10-CM | POA: Insufficient documentation

## 2011-03-14 DIAGNOSIS — I359 Nonrheumatic aortic valve disorder, unspecified: Secondary | ICD-10-CM

## 2011-03-14 MED ORDER — POTASSIUM CHLORIDE CRYS ER 20 MEQ PO TBCR: 40.0000 meq | EXTENDED_RELEASE_TABLET | Freq: Two times a day (BID) | ORAL | Status: DC

## 2011-03-14 MED ORDER — FUROSEMIDE 40 MG PO TABS: ORAL_TABLET | ORAL | Status: DC

## 2011-03-14 NOTE — Assessment & Plan Note (Signed)
Will need a cardiac MRI at some point.  Will discuss with Dr. Jens Som at her follow up to determine timing.

## 2011-03-14 NOTE — Patient Instructions (Signed)
Your physician recommends that you schedule a follow-up appointment in: 1 WEEK WITH DR. CRENSHAW OR SCOTT WEAVER, PA-C SAME DAY DR. Jens Som IS IN THE OFFICE.  Your physician has recommended you make the following change in your medication: INCREASE LASIX TO 80 MG IN THE MORNING AND 40 MG IN THE EVENING; POTASSIUM 20 mEq 2 TABS TWICE DAILY.  Your physician recommends that you return for lab work in: TODAY BMET, BNP 428.33;  REPEAT BMET IN 1 WEEK.  You have been referred to [facility] IN [place] TO EST. WITH A PRIMARY CARE PHYSICIAN

## 2011-03-14 NOTE — Assessment & Plan Note (Signed)
I offered her an ankle xray.  She deferred.  She would like a referral to a new PCP.  Refer to [facility] office.

## 2011-03-14 NOTE — Progress Notes (Signed)
History of Present Illness: Primary Cardiologist: Dr. Olga Millers   Chelsea [name] is a 45 y.o. female with diastolic CHF.  She was admitted to [facility] in January 2012 with pneumonia and respiratory failure. She was treated with antibiotics and diuretics with improvement.  She was transferred to selective medical rehabilitation services at [facility] for rehabilitation. She has had issues with not understanding her medications and taking varying amounts of diuretics.  She was admitted in March 2012 with acute respiratory insufficiency which was felt to be multifactorial and included obstructive sleep apnea and congestive heart failure.  Also she was in acute renal failure and required pressors at one point. Echocardiogram was repeated and showed an ejection fraction of 60-65% and a mildly elevated LVOT gradient felt secondary to hyperdynamic function or mild AS.  There was a mildly elevated gradient in her thoracic aorta.  There was trace AI.  She improved with therapy.    She was recently admitted from the office 5/23 with acute on chronic diastolic heart failure.  She had experienced a 37 pound weight gain.  She remained in the hospital until 5/28.  Minoxidil had been added recently and was thought to possibly contribute to fluid retention.  This was discontinued.  She was placed on IV diuresis in addition to her usual metolazone.  She had successful diuresis with discharge weight of 147.5 kg.  Echocardiogram suggested mild to moderate aortic stenosis as well as increased gradients in her thoracic aorta.  It was felt that she should probably have a cardiac MRI as an outpatient to further assess once her renal function stabilized.  She returns for follow up.  She has gained 7 pounds.  She denies worsening dyspnea.  Still wears O2.  No syncope or chest pain.  She is compliant with medications.  Does not have a scale at home.  Did take extra Lasix yesterday with good UOP.   No fevers, chills or cough.  She thinks she sprained her ankle.  She is having some pain and has a hard time bearing weight on it.  She has injured both ankles several times in the past.  Past Medical History  Diagnosis Date  . GERD (gastroesophageal reflux disease)   . Hypothyroid   . Hypertension   . OSA (obstructive sleep apnea)   . Obesity hypoventilation syndrome   . Diastolic congestive heart failure     Echo 02/21/11: EF 55-60%, moderate LVH, grade 2 diastolic dysfunction, mild to moderate aortic stenosis with a mean gradient 23 mm of mercury  . Cervical cancer   . Diabetes mellitus   . Hyperlipidemia   . Aortic stenosis     Echocardiogram 02/21/11:  Mean gradient 23 mm of mercury; peak gradient 36;; Turbulence noted in the area of the aortic isthmus with peak gradient 23 mmHg-consider MRI to assess for coarctation  . COPD (chronic obstructive pulmonary disease)     Current Outpatient Prescriptions  Medication Sig Dispense Refill  . Albuterol Sulfate (VENTOLIN HFA IN) As directed as needed       . aspirin 81 MG tablet Take 81 mg by mouth daily.        . celecoxib (CELEBREX) 200 MG capsule Take 200 mg by mouth daily. Or as directed      . famotidine (PEPCID) 20 MG tablet Take 20 mg by mouth 2 (two) times daily.        . ferrous sulfate 325 (65 FE) MG tablet Take 325 mg by  mouth 2 (two) times daily.        . Fluticasone-Salmeterol (ADVAIR DISKUS) 500-50 MCG/DOSE AEPB Inhale 1 puff into the lungs every 12 (twelve) hours.        . formoterol (PERFOROMIST) 20 MCG/2ML nebulizer solution Take 20 mcg by nebulization. As directed       . furosemide (LASIX) 40 MG tablet Take 40 mg by mouth 2 (two) times daily.        Marland Kitchen glucosamine-chondroitin 500-400 MG tablet Take 1 tablet by mouth 2 (two) times daily.        . insulin detemir (LEVEMIR) 100 UNIT/ML injection As directed       . levothyroxine (SYNTHROID, LEVOTHROID) 150 MCG tablet Take 150 mcg by mouth daily.        . Multiple Vitamin  (MULTIVITAMIN) tablet Take 1 tablet by mouth daily.        . NON FORMULARY oxige 2 1/2 litt       . omeprazole (PRILOSEC) 20 MG capsule Take 20 mg by mouth daily.        Marland Kitchen oxyCODONE-acetaminophen (PERCOCET) 10-325 MG per tablet Take 1 tablet by mouth every 4 (four) hours as needed.        . potassium chloride SA (K-DUR,KLOR-CON) 20 MEQ tablet Take 20 mEq by mouth 3 (three) times daily.        Marland Kitchen DISCONTD: gabapentin (NEURONTIN) 100 MG capsule 3 at bedtime       . DISCONTD: doxycycline (DORYX) 100 MG DR capsule Take 100 mg by mouth 2 (two) times daily.        Marland Kitchen DISCONTD: metolazone (ZAROXOLYN) 5 MG tablet Take 5 mg by mouth 2 (two) times daily.        Marland Kitchen DISCONTD: minoxidil (LONITEN) 10 MG tablet Take 10 mg by mouth daily.        Marland Kitchen DISCONTD: temazepam (RESTORIL) 30 MG capsule Take 30 mg by mouth at bedtime as needed.        Marland Kitchen DISCONTD: tiotropium (SPIRIVA) 18 MCG inhalation capsule Place 18 mcg into inhaler and inhale daily.          Allergies: Allergies  Allergen Reactions  . Minoxidil   . Penicillins     Social Hx:  No smoking.   ROS:  See HPI.  Rest of ROS reviewed and negative.   Vital Signs: BP 100/60  Pulse 70  Resp 20  Ht 5\' 7"  (1.702 m)  Wt 332 lb 6.4 oz (150.776 kg)  BMI 52.06 kg/m2  PHYSICAL EXAM: Well nourished, well developed, in no acute distress, wearing O2 with Anton Ruiz HEENT: normal Neck: no JVD at 90 degrees Cardiac:  normal S1, S2; RRR; 2/6 systolic murmur heard best at RUSB and apex Lungs:  clear to auscultation bilaterally, no wheezing, rhonchi or rales Abd: soft, nontender, no hepatomegaly Ext: 3+ brawny bilateral edema; no erythema MSK:  Left ankle difficult to examine due to body habitus; + tenderness over lateral malleolus Skin: warm and dry Neuro:  CNs 2-12 intact, no focal abnormalities noted  EKG:  Sinus rhythm, heart rate 70, normal axis, nonspecific ST-T wave changes  ASSESSMENT AND PLAN:

## 2011-03-14 NOTE — Assessment & Plan Note (Signed)
Weight is up 7 pounds.  She does not have a scale at home.  She notes increased edema.  She is not eating more salt and is compliant with medications.  She will have her lasix increased to 80 mg in AM and 40 mg in PM.  Increase K+ to 40 mEq BID.  Check BMET and BNP today and repeat BMET in a week.  Will try to get home health to help with CHF and get her a scale.  Follow up in a week with me.

## 2011-03-15 ENCOUNTER — Telehealth: Payer: Self-pay | Admitting: Cardiology

## 2011-03-15 LAB — BASIC METABOLIC PANEL
CO2: 35 mEq/L — ABNORMAL HIGH (ref 19–32)
Calcium: 9.3 mg/dL (ref 8.4–10.5)
Chloride: 97 mEq/L (ref 96–112)
Glucose, Bld: 141 mg/dL — ABNORMAL HIGH (ref 70–99)
Potassium: 3.9 mEq/L (ref 3.5–5.1)
Sodium: 140 mEq/L (ref 135–145)

## 2011-03-15 NOTE — Telephone Encounter (Addendum)
Letter on letter head faxed [facility] fax [phone], stating dates of hospital admisson and why Pt called back and said she also needs records from [facility] from March, April, and May sent with the fax and wants a call when done @ [phone] Pt calling back saying she needs this info faxed in the next day or too, pls call when done

## 2011-03-16 NOTE — Telephone Encounter (Signed)
Spoke with pt, she was not in the hosp in April. Records from march and may faxed to the number provided Chelsea [name]

## 2011-03-22 ENCOUNTER — Encounter: Payer: Self-pay | Admitting: Family Medicine

## 2011-03-22 ENCOUNTER — Encounter: Payer: Self-pay | Admitting: Physician Assistant

## 2011-03-22 ENCOUNTER — Ambulatory Visit (INDEPENDENT_AMBULATORY_CARE_PROVIDER_SITE_OTHER): Payer: Medicare Other | Admitting: Family Medicine

## 2011-03-22 ENCOUNTER — Encounter: Payer: Self-pay | Admitting: *Deleted

## 2011-03-22 ENCOUNTER — Other Ambulatory Visit (INDEPENDENT_AMBULATORY_CARE_PROVIDER_SITE_OTHER): Payer: Medicare Other | Admitting: *Deleted

## 2011-03-22 ENCOUNTER — Ambulatory Visit (INDEPENDENT_AMBULATORY_CARE_PROVIDER_SITE_OTHER): Payer: Medicare Other | Admitting: Physician Assistant

## 2011-03-22 DIAGNOSIS — E119 Type 2 diabetes mellitus without complications: Secondary | ICD-10-CM

## 2011-03-22 DIAGNOSIS — I890 Lymphedema, not elsewhere classified: Secondary | ICD-10-CM

## 2011-03-22 DIAGNOSIS — E669 Obesity, unspecified: Secondary | ICD-10-CM

## 2011-03-22 DIAGNOSIS — Z8541 Personal history of malignant neoplasm of cervix uteri: Secondary | ICD-10-CM

## 2011-03-22 DIAGNOSIS — I5033 Acute on chronic diastolic (congestive) heart failure: Secondary | ICD-10-CM

## 2011-03-22 DIAGNOSIS — R609 Edema, unspecified: Secondary | ICD-10-CM | POA: Insufficient documentation

## 2011-03-22 DIAGNOSIS — I060 Rheumatic aortic stenosis: Secondary | ICD-10-CM

## 2011-03-22 DIAGNOSIS — E039 Hypothyroidism, unspecified: Secondary | ICD-10-CM

## 2011-03-22 DIAGNOSIS — R21 Rash and other nonspecific skin eruption: Secondary | ICD-10-CM | POA: Insufficient documentation

## 2011-03-22 HISTORY — DX: Lymphedema, not elsewhere classified: I89.0

## 2011-03-22 HISTORY — DX: Personal history of malignant neoplasm of cervix uteri: Z85.41

## 2011-03-22 HISTORY — DX: Morbid (severe) obesity due to excess calories: E66.01

## 2011-03-22 HISTORY — DX: Edema, unspecified: R60.9

## 2011-03-22 LAB — BASIC METABOLIC PANEL
BUN: 23 mg/dL (ref 6–23)
CO2: 33 mEq/L — ABNORMAL HIGH (ref 19–32)
Chloride: 96 mEq/L (ref 96–112)
Glucose, Bld: 141 mg/dL — ABNORMAL HIGH (ref 70–99)
Potassium: 4.5 mEq/L (ref 3.5–5.1)

## 2011-03-22 MED ORDER — FUROSEMIDE 40 MG PO TABS: ORAL_TABLET | ORAL | Status: DC

## 2011-03-22 MED ORDER — TRIAMCINOLONE ACETONIDE 0.1 % EX OINT: TOPICAL_OINTMENT | Freq: Two times a day (BID) | CUTANEOUS | Status: DC

## 2011-03-22 MED ORDER — POTASSIUM CHLORIDE CRYS ER 20 MEQ PO TBCR: 40.0000 meq | EXTENDED_RELEASE_TABLET | Freq: Two times a day (BID) | ORAL | Status: DC

## 2011-03-22 NOTE — Progress Notes (Signed)
  Subjective:    Patient ID: Chelsea [name], female    DOB: [birth year], 45 y.o.   MRN: [id number]  HPI New to establish care.  Previous MD- Watt Climes.  PulmVassie Loll (seeing monthly).  Cards- [place].    Hx of cervical cancer- reports that she had TAH-BSO.  Has not seen GYN since.  Has never had mammogram.  DM- reports she had labs done while hospitalized in May.  Is only on Levemir.  Reports yearly eye exams (eye doctor in [place] but name unknown).  Reports home CBGs are in the 110s.  + neuropathy of legs bilaterally.  Hypothyroid- chronic problem for pt, on synthroid daily.  Edema- cards adjusted meds this morning.  Pt referred to PT in [place].  Obesity- pt reports she wants to lose weight.  Has seen nutritionist previously but reports this was not helpful.  Is currently limiting salt intake.  Reports she is trying to eat 'healthy food'.  Rash- L upper arm, started 2 weeks ago  Alternating diarrhea/constipation- ongoing problem for pt.    Review of Systems For ROS see HPI     Objective:   Physical Exam  Constitutional: She is oriented to person, place, and time. She appears well-developed and well-nourished.       Morbidly obese, O2 in place  HENT:  Head: Normocephalic and atraumatic.  Eyes: Conjunctivae and EOM are normal. Pupils are equal, round, and reactive to light.  Neck: Normal range of motion. Neck supple. No thyromegaly present.  Cardiovascular: Normal rate, regular rhythm and intact distal pulses.   Murmur heard. Pulmonary/Chest: Breath sounds normal. No respiratory distress. She has no wheezes.  Abdominal: Soft. Bowel sounds are normal. She exhibits no distension. There is no tenderness. There is no rebound.       obese  Musculoskeletal: She exhibits edema.  Lymphadenopathy:    She has no cervical adenopathy.  Neurological: She is alert and oriented to person, place, and time. No cranial nerve deficit.  Skin: Skin is warm and dry.       Erythematous rash on  upper arm  Psychiatric:       tearful          Assessment & Plan:

## 2011-03-22 NOTE — Progress Notes (Signed)
History of Present Illness: Primary Cardiologist: Dr. Olga Millers   Chelsea [name] is a 45 y.o. female with diastolic CHF.  She was admitted to [facility] in January 2012 with pneumonia and respiratory failure. She was treated with antibiotics and diuretics with improvement.  She was transferred to selective medical rehabilitation services at [facility] for rehabilitation. She has had issues with not understanding her medications and taking varying amounts of diuretics.  She was admitted in March 2012 with acute respiratory insufficiency which was felt to be multifactorial and included obstructive sleep apnea and congestive heart failure.  Also she was in acute renal failure and required pressors at one point. Echocardiogram was repeated and showed an ejection fraction of 60-65% and a mildly elevated LVOT gradient felt secondary to hyperdynamic function or mild AS.  There was a mildly elevated gradient in her thoracic aorta.  There was trace AI.  She improved with therapy.    She was recently admitted from the office 5/23 with acute on chronic diastolic heart failure.  She had successful diuresis with discharge weight of 147.5 kg.  Echocardiogram suggested mild to moderate aortic stenosis as well as increased gradients in her thoracic aorta.  It was felt that she should probably have a cardiac MRI as an outpatient to further assess once her renal function stabilized.  She returns for follow up.  I saw her last week and she had gained 7 pounds.  I increased her Lasix to 80/40 mg daily.  Her BNP returned in the normal range and I had her do this only for 5 days.  She returns today for follow up.  She continues to have lower extremity edema.  Her weight is up 4 more pounds since last visit.  She denies chest pain, increased shortness of breath or syncope.  She denies fevers, chills, cough, vomiting, diarrhea.  She does have a rash on her left upper arm that is pruritic.  She  sees her new PCP today.   Past Medical History  Diagnosis Date  . GERD (gastroesophageal reflux disease)   . Hypothyroid   . Hypertension   . OSA (obstructive sleep apnea)   . Obesity hypoventilation syndrome   . Diastolic congestive heart failure     Echo 02/21/11: EF 55-60%, moderate LVH, grade 2 diastolic dysfunction, mild to moderate aortic stenosis with a mean gradient 23 mm of mercury  . Cervical cancer   . Diabetes mellitus   . Hyperlipidemia   . Aortic stenosis     Echocardiogram 02/21/11:  Mean gradient 23 mm of mercury; peak gradient 36;; Turbulence noted in the area of the aortic isthmus with peak gradient 23 mmHg-consider MRI to assess for coarctation  . COPD (chronic obstructive pulmonary disease)     Current Outpatient Prescriptions  Medication Sig Dispense Refill  . Albuterol Sulfate (VENTOLIN HFA IN) As directed as needed       . aspirin 81 MG tablet Take 81 mg by mouth daily.        . celecoxib (CELEBREX) 200 MG capsule Take 200 mg by mouth daily. Or as directed      . famotidine (PEPCID) 20 MG tablet Take 20 mg by mouth 2 (two) times daily.        . ferrous sulfate 325 (65 FE) MG tablet Take 325 mg by mouth 2 (two) times daily.        . Fluticasone-Salmeterol (ADVAIR DISKUS) 500-50 MCG/DOSE AEPB Inhale 1 puff into the lungs every  12 (twelve) hours.        . formoterol (PERFOROMIST) 20 MCG/2ML nebulizer solution Take 20 mcg by nebulization. As directed       . furosemide (LASIX) 40 MG tablet Take 40 mg by mouth 2 (two) times daily.        Marland Kitchen glucosamine-chondroitin 500-400 MG tablet Take 1 tablet by mouth 2 (two) times daily.        . insulin detemir (LEVEMIR) 100 UNIT/ML injection As directed       . levothyroxine (SYNTHROID, LEVOTHROID) 150 MCG tablet Take 150 mcg by mouth daily.        . Multiple Vitamin (MULTIVITAMIN) tablet Take 1 tablet by mouth daily.        . NON FORMULARY oxige 2 1/2 litt       . omeprazole (PRILOSEC) 20 MG capsule Take 20 mg by mouth daily.         Marland Kitchen oxyCODONE-acetaminophen (PERCOCET) 10-325 MG per tablet Take 1 tablet by mouth every 4 (four) hours as needed.        . potassium chloride SA (K-DUR,KLOR-CON) 20 MEQ tablet Take 20 mEq by mouth 3 (three) times daily.        Marland Kitchen DISCONTD: furosemide (LASIX) 40 MG tablet TAKE 2 TABS IN THE MORNING AND 1 TAB IN THE EVENING  90 tablet  3  . DISCONTD: potassium chloride SA (K-DUR,KLOR-CON) 20 MEQ tablet Take 2 tablets (40 mEq total) by mouth 2 (two) times daily.  120 tablet  3    Allergies: Allergies  Allergen Reactions  . Minoxidil   . Penicillins     Social Hx:  No smoking.  Vital Signs: BP 102/60  Pulse 78  Ht 5\' 7"  (1.702 m)  Wt 336 lb (152.409 kg)  BMI 52.63 kg/m2  PHYSICAL EXAM: Well nourished, well developed, in no acute distress, wearing O2 with [place] HEENT: normal Neck: no JVD at 90 degrees Cardiac:  normal S1, S2; RRR; 2/6 systolic murmur heard best at RUSB and apex Lungs:  clear to auscultation bilaterally, no wheezing, rhonchi or rales Abd: soft, nontender, no hepatomegaly Ext: 3+ brawny bilateral edema; no erythema Skin: warm and dry, Few scattered Erythematous plaques Medial left upper arm Neuro:  CNs 2-12 intact, no focal abnormalities noted  ASSESSMENT AND PLAN:

## 2011-03-22 NOTE — Patient Instructions (Signed)
Please follow up in 4-6 weeks to see how things are going We'll call you with your GYN, Nutrition, and Cardiac Rehab appts Start the Triamcinolone twice daily on your rash We'll notify you of your diabetes level and adjust meds as needed Consider starting counseling Call with any questions or concerns Hang in there!

## 2011-03-22 NOTE — Patient Instructions (Addendum)
Your physician has requested that you have a TEE 04/02/11 @ 1:00 PM. During a TEE, sound waves are used to create images of your heart. It provides your doctor with information about the size and shape of your heart and how well your heart's chambers and valves are working. In this test, a transducer is attached to the end of a flexible tube that's guided down your throat and into your esophagus (the tube leading from you mouth to your stomach) to get a more detailed image of your heart. You are not awake for the procedure. Please see the instruction sheet given to you today. For further information please visit [email or web address]   You have been referred to PHYSICAL THERAPY IN [place] FOR LYMPHEDEMA.  Your physician has recommended you make the following change in your medication: INCREASE POTASSIUM 20 mEq TAKE 2 TABLETS TWICE DAILY; INCREASE LASIX FOR 2 DAYS ONLY TAKE 80 MG TWICE DAILY; THEN START ON 80 MG IN THE MORNING AND 40 MG IN THE EVENING  Your physician recommends that you return for lab work in: 03/29/11 REPEAT BMET 428.33., CBC W/DIFF, PT/INR PRE TEE LABS  Your physician recommends that you schedule a follow-up appointment in: 2 WEEKS WITH [employer] WEAVER PA-C

## 2011-03-22 NOTE — Assessment & Plan Note (Signed)
This appears to be some type of contact dermatitis.  I recommended that she use over-the-counter Claritin and hydrocortisone cream.  She sees her new PCP today.  She can have this further evaluated at that time.

## 2011-03-22 NOTE — Assessment & Plan Note (Signed)
She is unable to use compression stockings.  I will try to refer her to a lymphedema clinic to see if they can help in reducing her lower extremity edema.

## 2011-03-22 NOTE — Assessment & Plan Note (Signed)
I discussed this with Dr. Jens Som.  There is also concern for coarctation.  Her size may limit the feasibility of MRI.  Dr. Jens Som suggested that we proceed with transesophageal echocardiogram initially.  This will be arranged in the next 2 weeks.

## 2011-03-22 NOTE — Assessment & Plan Note (Signed)
She continues to gain weight.  She has significant lower extremity edema.  She did have minimal response to increased doses of Lasix.  I have asked her to increase her Lasix again to 80 mg twice a day for 2 days and then decrease back to 80 mg in the morning and 40 mg in the afternoon.  She will increase her potassium to 40 mEq twice a day.  Check a basic metabolic panel today.  Repeat a basic metabolic panel in one week.  Follow up with me in 2 weeks.

## 2011-03-26 ENCOUNTER — Encounter: Payer: Self-pay | Admitting: *Deleted

## 2011-03-26 ENCOUNTER — Encounter: Payer: Self-pay | Admitting: Family Medicine

## 2011-03-28 ENCOUNTER — Encounter: Payer: Medicare Other | Attending: Internal Medicine

## 2011-03-28 DIAGNOSIS — Z713 Dietary counseling and surveillance: Secondary | ICD-10-CM | POA: Insufficient documentation

## 2011-03-28 DIAGNOSIS — E119 Type 2 diabetes mellitus without complications: Secondary | ICD-10-CM | POA: Insufficient documentation

## 2011-03-29 ENCOUNTER — Other Ambulatory Visit (INDEPENDENT_AMBULATORY_CARE_PROVIDER_SITE_OTHER): Payer: Medicare Other | Admitting: *Deleted

## 2011-03-29 ENCOUNTER — Other Ambulatory Visit: Payer: Self-pay | Admitting: Family Medicine

## 2011-03-29 DIAGNOSIS — I5033 Acute on chronic diastolic (congestive) heart failure: Secondary | ICD-10-CM

## 2011-03-29 DIAGNOSIS — I060 Rheumatic aortic stenosis: Secondary | ICD-10-CM

## 2011-03-29 DIAGNOSIS — M25572 Pain in left ankle and joints of left foot: Secondary | ICD-10-CM

## 2011-03-29 DIAGNOSIS — I890 Lymphedema, not elsewhere classified: Secondary | ICD-10-CM

## 2011-03-29 LAB — CBC WITH DIFFERENTIAL/PLATELET
Basophils Relative: 0.4 % (ref 0.0–3.0)
Eosinophils Absolute: 0.2 10*3/uL (ref 0.0–0.7)
HCT: 33.9 % — ABNORMAL LOW (ref 36.0–46.0)
Lymphs Abs: 2.8 10*3/uL (ref 0.7–4.0)
MCHC: 33.9 g/dL (ref 30.0–36.0)
MCV: 89.5 fl (ref 78.0–100.0)
Monocytes Absolute: 0.6 10*3/uL (ref 0.1–1.0)
Neutrophils Relative %: 56.8 % (ref 43.0–77.0)
Platelets: 203 10*3/uL (ref 150.0–400.0)

## 2011-03-29 LAB — BASIC METABOLIC PANEL
BUN: 20 mg/dL (ref 6–23)
CO2: 34 mEq/L — ABNORMAL HIGH (ref 19–32)
Chloride: 96 mEq/L (ref 96–112)
Creatinine, Ser: 0.8 mg/dL (ref 0.4–1.2)

## 2011-03-29 LAB — PROTIME-INR
INR: 1.1 ratio — ABNORMAL HIGH (ref 0.8–1.0)
Prothrombin Time: 12.6 s — ABNORMAL HIGH (ref 10.2–12.4)

## 2011-03-29 MED ORDER — OXYCODONE-ACETAMINOPHEN 10-325 MG PO TABS: 1.0000 | ORAL_TABLET | ORAL | Status: DC | PRN

## 2011-03-29 NOTE — Telephone Encounter (Signed)
Ok for #60, no refills 

## 2011-03-29 NOTE — Telephone Encounter (Signed)
Please advise 

## 2011-03-29 NOTE — Telephone Encounter (Signed)
Pt.notified

## 2011-03-29 NOTE — Telephone Encounter (Signed)
Refill onycodone - patient wants to pick up today

## 2011-04-02 ENCOUNTER — Ambulatory Visit (HOSPITAL_COMMUNITY)
Admission: RE | Admit: 2011-04-02 | Discharge: 2011-04-02 | Disposition: A | Discharge status: Home / Self Care | Payer: Medicare Other | Source: Ambulatory Visit | Attending: Cardiology | Admitting: Cardiology

## 2011-04-02 DIAGNOSIS — G4733 Obstructive sleep apnea (adult) (pediatric): Secondary | ICD-10-CM | POA: Insufficient documentation

## 2011-04-02 DIAGNOSIS — E785 Hyperlipidemia, unspecified: Secondary | ICD-10-CM | POA: Insufficient documentation

## 2011-04-02 DIAGNOSIS — I5033 Acute on chronic diastolic (congestive) heart failure: Secondary | ICD-10-CM | POA: Insufficient documentation

## 2011-04-02 DIAGNOSIS — K219 Gastro-esophageal reflux disease without esophagitis: Secondary | ICD-10-CM | POA: Insufficient documentation

## 2011-04-02 DIAGNOSIS — I359 Nonrheumatic aortic valve disorder, unspecified: Secondary | ICD-10-CM

## 2011-04-02 DIAGNOSIS — I10 Essential (primary) hypertension: Secondary | ICD-10-CM | POA: Insufficient documentation

## 2011-04-02 DIAGNOSIS — Z79899 Other long term (current) drug therapy: Secondary | ICD-10-CM | POA: Insufficient documentation

## 2011-04-02 DIAGNOSIS — E662 Morbid (severe) obesity with alveolar hypoventilation: Secondary | ICD-10-CM | POA: Insufficient documentation

## 2011-04-02 DIAGNOSIS — E039 Hypothyroidism, unspecified: Secondary | ICD-10-CM | POA: Insufficient documentation

## 2011-04-02 DIAGNOSIS — Z8541 Personal history of malignant neoplasm of cervix uteri: Secondary | ICD-10-CM | POA: Insufficient documentation

## 2011-04-04 NOTE — Assessment & Plan Note (Signed)
>>  ASSESSMENT AND PLAN FOR DM WRITTEN ON 04/04/2011  3:42 PM BY Sheliah Hatch, MD  Pt is on strange DM regimen.  Will check labs and adjust meds prn.  UTD on eye exam.  Wants to lose weight and improve her current situation.  Will refer to nutrition and follow closely.

## 2011-04-04 NOTE — Assessment & Plan Note (Signed)
meds were adjusted this AM by cards.  Reviewed importance of limiting Na in diet.  Will follow.

## 2011-04-04 NOTE — Assessment & Plan Note (Signed)
Discussed this at length w/ pt.  She is depressed about her situation.  Wants to lose weight.  Feels she's stuck in a cycle where she can't exercise b/c of her weight but she can't lose weight w/out exercise.  Will refer to nutrition and cardiac rehab to hopefully improve both factors.  Will follow closely.

## 2011-04-04 NOTE — Assessment & Plan Note (Signed)
Rash is nonspecific.  Will tx w/ steroid cream.  Pt to call if no improvement.

## 2011-04-04 NOTE — Assessment & Plan Note (Signed)
Refer to GYN to ensure she has no additional complications from previous cancer dx.

## 2011-04-04 NOTE — Assessment & Plan Note (Signed)
Pt is on strange DM regimen.  Will check labs and adjust meds prn.  UTD on eye exam.  Wants to lose weight and improve her current situation.  Will refer to nutrition and follow closely.

## 2011-04-04 NOTE — Assessment & Plan Note (Signed)
Currently on meds. Will check labs.  Adjust meds prn.

## 2011-04-04 NOTE — Assessment & Plan Note (Signed)
Refer to cardiac rehab to improve pt's mobility, endurance and quality of life

## 2011-04-06 NOTE — Telephone Encounter (Signed)
Patient called to report that this prescription is half of what her other doctor gave her---she says she tales two pills every five hours---says she will run out of this prescription soon---please call her at [phone] and advise

## 2011-04-06 NOTE — Telephone Encounter (Signed)
Addended by: Lucious Groves I on: 04/06/2011 01:25 PM   Modules accepted: Orders

## 2011-04-06 NOTE — Telephone Encounter (Signed)
Pt notified and notes that she is almost out. I made pt aware that we will refer to pain management. Pt notified me to do it quickly because she is almost out and disconnected the call.

## 2011-04-06 NOTE — Telephone Encounter (Signed)
Per Dr. Beverely Low I called the pt back and confirmed that she is taking two pills at a time and takes the med for leg and ankle pain. Pt is aware to call back once due for refill, but MD will not change the way the rx is written.

## 2011-04-09 ENCOUNTER — Ambulatory Visit (INDEPENDENT_AMBULATORY_CARE_PROVIDER_SITE_OTHER): Payer: Medicare Other | Admitting: Family Medicine

## 2011-04-09 VITALS — BP 122/70 | Temp 98.1°F | Wt 331.4 lb

## 2011-04-09 DIAGNOSIS — G5790 Unspecified mononeuropathy of unspecified lower limb: Secondary | ICD-10-CM | POA: Insufficient documentation

## 2011-04-09 DIAGNOSIS — M792 Neuralgia and neuritis, unspecified: Secondary | ICD-10-CM

## 2011-04-09 HISTORY — DX: Neuralgia and neuritis, unspecified: M79.2

## 2011-04-09 HISTORY — DX: Unspecified mononeuropathy of unspecified lower limb: G57.90

## 2011-04-09 MED ORDER — GABAPENTIN 600 MG PO TABS: 600.0000 mg | ORAL_TABLET | Freq: Three times a day (TID) | ORAL | Status: DC

## 2011-04-09 MED ORDER — OXYCODONE-ACETAMINOPHEN 10-325 MG PO TABS: 1.0000 | ORAL_TABLET | ORAL | Status: DC | PRN

## 2011-04-09 NOTE — Patient Instructions (Signed)
We will call you with your neurology appt Start the Gabapentin/Neurontin as directed for your neuropathic pain Use the Percocet as needed- only 1 tab every 4-6 hrs for severe pain Call with any questions or concerns Hang in there!

## 2011-04-09 NOTE — Progress Notes (Signed)
  Subjective:    Patient ID: Chelsea [name], female    DOB: 06-17-1966, 45 y.o.   MRN: [id number]  HPI Foot pain/leg pain- reports she's used to taking more than 10/325mg  percocet q4.  She says she's unable to sleep, move, or walk b/c of pain.  'feels like my legs are on fire'.  'it's like walking on pins and needles all the time'.  Upset that i won't prescribe more narcotics- 'that's what i'm used to'.  Keeps saying, 'you have to do something'.  Has never had nerve conduction study.   Review of Systems For ROS see HPI     Objective:   Physical Exam  Constitutional: She is oriented to person, place, and time.       Morbidly obese w/ O2 in place  Musculoskeletal: She exhibits edema and tenderness (diffusely along legs bilaterally).  Neurological: She is alert and oriented to person, place, and time.  Psychiatric:       Angry, frustrated          Assessment & Plan:

## 2011-04-10 ENCOUNTER — Encounter: Payer: Self-pay | Admitting: Physician Assistant

## 2011-04-10 ENCOUNTER — Telehealth: Payer: Self-pay | Admitting: Family Medicine

## 2011-04-10 ENCOUNTER — Ambulatory Visit (INDEPENDENT_AMBULATORY_CARE_PROVIDER_SITE_OTHER): Payer: Medicare Other | Admitting: Physician Assistant

## 2011-04-10 VITALS — BP 100/62 | HR 81 | Resp 20 | Ht 67.0 in | Wt 329.8 lb

## 2011-04-10 DIAGNOSIS — I5032 Chronic diastolic (congestive) heart failure: Secondary | ICD-10-CM

## 2011-04-10 DIAGNOSIS — I359 Nonrheumatic aortic valve disorder, unspecified: Secondary | ICD-10-CM

## 2011-04-10 DIAGNOSIS — I509 Heart failure, unspecified: Secondary | ICD-10-CM

## 2011-04-10 DIAGNOSIS — I350 Nonrheumatic aortic (valve) stenosis: Secondary | ICD-10-CM

## 2011-04-10 LAB — BASIC METABOLIC PANEL
BUN: 21 mg/dL (ref 6–23)
Chloride: 102 mEq/L (ref 96–112)
Creatinine, Ser: 0.8 mg/dL (ref 0.4–1.2)
Glucose, Bld: 211 mg/dL — ABNORMAL HIGH (ref 70–99)

## 2011-04-10 NOTE — Progress Notes (Signed)
History of Present Illness: Primary Cardiologist: Dr. 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 female with diastolic CHF.  She was admitted to [facility] in January 2012 with pneumonia and respiratory failure. She recovered at selective medical rehabilitation services at [facility]. She has had issues with not understanding her medications and taking varying amounts of diuretics in the past.  She was admitted in March 2012 with acute respiratory insufficiency which was felt to be multifactorial and included obstructive sleep apnea and congestive heart failure.  Also she had acute renal failure and required pressors at one point.  Echocardiogram showed an ejection fraction of 60-65% and a mildly elevated LVOT gradient felt secondary to hyperdynamic function or mild AS.  There was a mildly elevated gradient in her thoracic aorta.  There was trace AI.    She was admitted again from the office 5/23 with acute on chronic diastolic heart failure.  Echocardiogram this time suggested mild to moderate aortic stenosis as well as increased gradients in her thoracic aorta.  It was felt that she should probably have a cardiac MRI as an outpatient to further assess.  When I last saw her, I discussed with Dr. Jens Som and he suggested a TEE to r/o significant AS and to r/o coarctation.  I adjusted her diuretics last time.    She had the TEE on 7/2.  This demonstrated normal LVF, mild AS with a mean gradient of 16.  There was no evidence of coarctation.  She returns for follow up. She states she feels very good.  Saw Dr. Beverely Low and had neurontin added to meds.  This is helping her legs and she is walking more.  Her weight is down 7 pounds and she is only on lasix 40 mg BID.  She is breathing better.   Trying to get into pulmonary rehab at [facility].  Also going to diabetes classes.   Past Medical History  Diagnosis Date  . GERD (gastroesophageal reflux disease)   .  Hypothyroid   . Hypertension   . OSA (obstructive sleep apnea)   . Obesity hypoventilation syndrome   . Diastolic congestive heart failure     Echo 02/21/11: EF 55-60%, moderate LVH, grade 2 diastolic dysfunction, mild to moderate aortic stenosis with a mean gradient 23 mm of mercury  . Cervical cancer   . Diabetes mellitus   . Hyperlipidemia   . Aortic stenosis     Echocardiogram 02/21/11:  Mean gradient 23 mm of mercury; peak gradient 36;; Turbulence noted in the area of the aortic isthmus with peak gradient 23 mmHg-consider MRI to assess for coarctation;    TEE: 04/02/11:  EF 55-60%, mod BAE, trileaflet AV with mild AS (pk and mean 25 and 16), mild AI, mild MR, mild TR, atrial septal aneurysm, no evidence of corarctation  . COPD (chronic obstructive pulmonary disease)     Current Outpatient Prescriptions  Medication Sig Dispense Refill  . Albuterol Sulfate (VENTOLIN HFA IN) As directed as needed      . aspirin 81 MG tablet Take 81 mg by mouth daily.        . celecoxib (CELEBREX) 200 MG capsule Take 200 mg by mouth daily. Or as directed      . famotidine (PEPCID) 20 MG tablet Take 20 mg by mouth 2 (two) times daily.        . ferrous sulfate 325 (65 FE) MG tablet Take 325 mg by mouth 2 (  two) times daily.        . Fluticasone-Salmeterol (ADVAIR DISKUS) 500-50 MCG/DOSE AEPB Inhale 1 puff into the lungs every 12 (twelve) hours.        . formoterol (PERFOROMIST) 20 MCG/2ML nebulizer solution Take 20 mcg by nebulization. As directed       . furosemide (LASIX) 40 MG tablet 40 mg 2 (two) times daily.        Marland Kitchen gabapentin (NEURONTIN) 600 MG tablet Take 1 tablet (600 mg total) by mouth 3 (three) times daily.  90 tablet  2  . glucosamine-chondroitin 500-400 MG tablet Take 1 tablet by mouth 2 (two) times daily.        . insulin detemir (LEVEMIR) 100 UNIT/ML injection As directed       . levothyroxine (SYNTHROID, LEVOTHROID) 150 MCG tablet Take 150 mcg by mouth daily.        . Multiple Vitamin  (MULTIVITAMIN) tablet Take 1 tablet by mouth daily.        . NON FORMULARY oxige 2 1/2 litt       . omeprazole (PRILOSEC) 20 MG capsule Take 20 mg by mouth daily.        Marland Kitchen oxyCODONE-acetaminophen (PERCOCET) 10-325 MG per tablet Take 1 tablet by mouth every 4 (four) hours as needed.  60 tablet  0  . potassium chloride SA (K-DUR,KLOR-CON) 20 MEQ tablet Take 20 mEq by mouth 3 (three) times daily.        Marland Kitchen triamcinolone (KENALOG) 0.1 % ointment Apply topically 2 (two) times daily.  90 g  1  . DISCONTD: furosemide (LASIX) 40 MG tablet TAKE 80 MG TWICE DAILY FOR 2 DAYS ONLY ;  THEN START 80 MG IN THE MORNING AND 40 MG IN THE EVENING      120 tablet  3  . DISCONTD: potassium chloride SA (K-DUR,KLOR-CON) 20 MEQ tablet Take 2 tablets (40 mEq total) by mouth 2 (two) times daily.  120 tablet  3    Allergies: Allergies  Allergen Reactions  . Minoxidil   . Penicillins     Vital Signs: BP 100/62  Pulse 81  Resp 20  Ht 5\' 7"  (1.702 m)  Wt 329 lb 12.8 oz (149.596 kg)  BMI 51.65 kg/m2  PHYSICAL EXAM: Well nourished, well developed, in no acute distress, wearing O2 with [place] HEENT: normal Neck: no JVD at 90 degrees Cardiac:  normal S1, S2; RRR; 2/6 systolic murmur heard best at RUSB and apex Lungs:  clear to auscultation bilaterally, no wheezing, rhonchi or rales Abd: soft, nontender, no hepatomegaly Ext: 2+ brawny bilateral edema; no erythema (improved from last visit) Skin: warm and dry Neuro:  CNs 2-12 intact, no focal abnormalities noted  ASSESSMENT AND PLAN:

## 2011-04-10 NOTE — Patient Instructions (Addendum)
Your physician recommends that you return for lab work in: TODAY BMET [phone]  Your physician recommends that you schedule a follow-up appointment in: 06/06/11 @ 2:00 TO SEE DR. CRENSHAW AS PER SCOTT WEAVER, PA-C  You have been referred to [facility] [facility] CLIN[facility] CONTACT PERSON IS LISA COTTRILL [phone] AND HER FAX # IS [phone] DX [phone] HEART FAILURE

## 2011-04-10 NOTE — Assessment & Plan Note (Signed)
Volume is stable.  She is more comfortable with recognizing edema gains and taking extra lasix.  Her increased mobility is undoubtedly helping with reducing her edema.  We will check on her referral to the lymphedema clinic.  Check bmet today.  Follow up with Dr. Jens Som in 4-6 weeks.  She knows to return sooner if her swelling or breathing should change.

## 2011-04-10 NOTE — Telephone Encounter (Signed)
Jamesetta So from [facility] Cardiac/Pulomonary Rehab returned my call today questioning the request for Cardiac Rehab for patient.  Jamesetta So states with patient's dx of Acute chronic diastolic heart failure, the patient does not qualify for the monitored cardiac rehab class, but patient can do the Cardiac Maintenance or the Pulmonary Rehab classes.  Pulmonary Rehab is also not a monitored class, Medicaid does not cover, but patient can apply for financial assist through [facility].  Cardiac Maintenance is completely out of pocket for patient at $68 per month, plus $14 per visit for oxygen usage unless patient brings enough of her own.  Jamesetta So wanted this information forwarded to you, and you decide which to do, and states for any further questions she is available at [phone].

## 2011-04-10 NOTE — Assessment & Plan Note (Signed)
Mild by TEE with no evidence of coarctation.

## 2011-04-11 ENCOUNTER — Telehealth: Payer: Self-pay | Admitting: *Deleted

## 2011-04-11 NOTE — Telephone Encounter (Signed)
Could not lm due to her mail box was full on her phone. Chelsea [name]

## 2011-04-12 ENCOUNTER — Encounter: Payer: Medicare Other | Attending: Internal Medicine

## 2011-04-12 DIAGNOSIS — E119 Type 2 diabetes mellitus without complications: Secondary | ICD-10-CM | POA: Insufficient documentation

## 2011-04-12 DIAGNOSIS — Z713 Dietary counseling and surveillance: Secondary | ICD-10-CM | POA: Insufficient documentation

## 2011-04-12 NOTE — Progress Notes (Signed)
  Patient was seen on 04/12/2011 for the second of a series of three diabetes self-management courses at [facility]. The following learning objectives were met by the patient during this course:   States the relationship of exercise to blood glucose  States benefits/barriers of regular and safe exercise  States three guidelines for safe and effective exercise  Describes personal diabetes medicine regimen  Describes actions of own medications  Describes causes, symptoms, and treatment of hypo/hyperglycemia  Describes sick day rules  Identifies when to test urine for ketones when appropriate  Identifies when to call healthcare provider for acute complications  States the risk for problems with foot, skin, and dental care  States preventative foot, skin, and dental care measures  States when to call healthcare provider regarding foot, skin, and dental care  Identifies methods for evaluation of diabetes control  Discusses benefits of SBGM  Identifies relationship between nutrition, exercise, medication, and glucose levels  Discusses the importance of record keeping  Educational Materials: [facility] Core Program Notebook Follow-Up Plan: Patient will attend the final class of the ADA Diabetes Self-Care Education.  

## 2011-04-13 ENCOUNTER — Telehealth: Payer: Self-pay | Admitting: Physician Assistant

## 2011-04-13 NOTE — Assessment & Plan Note (Signed)
Spent >30 minutes w/ pt and boyfriend and >50% of this was spent counseling on appropriate use of narcotics and their lack of efficacy in neuropathic pain.  Will start gabapentin at high dose to try and control sxs.  Will refer to neuro for nerve conduction study to see if there is something that can be done to improve pt's pain and function.  In regards to pt asking for additional narcotics b/c that's what she's used to, I explained that I would refill her meds this time but she had to take them as prescribed or i would not provide additional refills.  i will not continue a prescription that is not medically appropriate b/c that is what she is 'used to'.  Pt aware.  Will follow.

## 2011-04-16 ENCOUNTER — Ambulatory Visit: Payer: Medicare Other | Admitting: Cardiology

## 2011-04-16 NOTE — Telephone Encounter (Signed)
Please call pt and give her the below info- particularly the cost part of things and ask what she would like to do.  Once we have an answer- please let Renee know.  Thanks.

## 2011-04-16 NOTE — Telephone Encounter (Signed)
Dr. Cira Rue from [facility] called this morning, 04-16-11, about your decision for this patient.  They are ready to schedule her, but can not until they get your decision.

## 2011-04-16 NOTE — Telephone Encounter (Signed)
Left message on voicemail to call the office

## 2011-04-17 NOTE — Telephone Encounter (Signed)
Attempted to notify the pt and voicemail is full. Cannot leave message, must try again at another time.

## 2011-04-18 ENCOUNTER — Telehealth: Payer: Self-pay | Admitting: Family Medicine

## 2011-04-18 NOTE — Telephone Encounter (Signed)
Same

## 2011-04-18 NOTE — Telephone Encounter (Signed)
PER FAX FROM [facility], THE ARE NOT ACCEPTING THIS PATIENT, STATING THEY HAVE NO ADDITIONAL PAIN/REHAB OPTIONS TO OFFER HER.  I WILL NOW REFER HER TO ANOTHER PAIN CLINIC.    ADDITIONALLY, PATIENT'S MEDICAID [place] ACCESS CARD IS CAUSING PROBLEMS MAKING REFERRAL APPOINTMENTS FOR THE PATIENT BECAUSE IT STILL REFLECTS ANOTHER PRACTICE AS HER PCP.  PER HER PREVIOUS PRACTICE, THEY HAVE BEEN TELLING HER FOR YEARS TO GET IT CHANGED.  I HAVE ALSO TOLD HER TO GET CHANGED ASAP TO AVOID PROBLEMS.

## 2011-04-19 ENCOUNTER — Encounter: Payer: Self-pay | Admitting: Pulmonary Disease

## 2011-04-19 ENCOUNTER — Other Ambulatory Visit: Payer: Self-pay | Admitting: Family Medicine

## 2011-04-19 ENCOUNTER — Ambulatory Visit (INDEPENDENT_AMBULATORY_CARE_PROVIDER_SITE_OTHER): Payer: Medicare Other | Admitting: Pulmonary Disease

## 2011-04-19 DIAGNOSIS — I5032 Chronic diastolic (congestive) heart failure: Secondary | ICD-10-CM

## 2011-04-19 DIAGNOSIS — I509 Heart failure, unspecified: Secondary | ICD-10-CM

## 2011-04-19 DIAGNOSIS — G4733 Obstructive sleep apnea (adult) (pediatric): Secondary | ICD-10-CM

## 2011-04-19 NOTE — Telephone Encounter (Signed)
Thanks I will confirm med with pt when she calls back before printing rx.

## 2011-04-19 NOTE — Telephone Encounter (Signed)
Printed and given to [place]. Please see other phone note, practice may not be able to continue seeing the patient.

## 2011-04-19 NOTE — Progress Notes (Signed)
  Subjective:    Patient ID: Chelsea [name], female    DOB: 17-Nov-1965, 45 y.o.   MRN: [id number]  HPI PCP - Tabori  44/F, morbidly obese for FU of obstructive sleep apnea . On O2 since 2004 after cervical CA surgery, got ON CPAP around the same time - [facility] .  admitted to [facility] in January with pneumonia and respiratory failure. [facility] at [place] , then [facility], She was treated with antibiotics and diuretics with improvement. [facility]. She was weaned off BiPPA to O2 daytime & nocturnal CPAP. Echocardiogram in January of 2012 showed normal LV function, mild left atrial enlargement and mild aortic insufficiency. Labs were obtained on 2/14 and her BUN was 49 (up from 10 on 11/05/10); creatinine 1.3 (up from 0.72) and BNP was 84. Diuretics are being adjusted by cardiology.  Has gained 50 lbs since dc  Advair prior / spiriva started in [facility] - unclear if this is helping  Lisinopril noted on med review - denies cough  CXR - no effusions , improved from jan'12   12/29/2010  PSG reviewed >> Severe obstructive sleep apnea with hypopneas causing oxygen desaturation and sleep fragmentation. This was corrected by CPAP of 18 cm with a C-Flex of 3 cm with 2 L of oxygen blended in.  Limb movements seemed to emerge and increase on CPAP  At the time of this study, she weighed 322 pounds with a height of 5 feet 7 inches, BMI of 50 and neck size of 18 inches.  Hospitalised 3/9-3/17/12 for acute renal failure (on toresemide & metalazone ) & hypercarbic resp failure   04/19/2011 Not taking perforomist On advair, not had to use albuterol Gained 30 lbs to 355 lbs , Increased lasix to 40 mg  tid    Review of Systems Pt denies any significant  nasal congestion or excess secretions, fever, chills, sweats, unintended wt loss, pleuritic or exertional cp, orthopnea pnd or leg swelling.  Pt also denies any obvious fluctuation in  symptoms with weather or environmental change or other alleviating or aggravating factors.    Pt denies any increase in rescue therapy over baseline, denies waking up needing it or having early am exacerbations or coughing/wheezing/ or dyspnea      Objective:   Physical Exam Gen. Pleasant, obese, in no distress ENT - no lesions, no post nasal drip Neck: No JVD, no thyromegaly, no carotid bruits Lungs: no use of accessory muscles, no dullness to percussion, clear without rales or rhonchi  Cardiovascular: Rhythm regular, heart sounds  normal, no murmurs or gallops, no peripheral edema Musculoskeletal: No deformities, no cyanosis or clubbing , 2 + edema        Assessment & Plan:

## 2011-04-19 NOTE — Telephone Encounter (Signed)
Yes

## 2011-04-19 NOTE — Telephone Encounter (Signed)
Printed and given to [facility], practice may not be able to continue seeing the patient.

## 2011-04-19 NOTE — Telephone Encounter (Addendum)
Our office has previously not been able to leave messages for the pt (clarification and referral issues -- [facility] aware).  I called pt to find out if she was completely out of meds (and confirm med needed -- Percocet not Endocet) or if she has enough to last until she sees MD on Monday. Left message on voicemail to call the office.  Dr. Laury Axon, if the pt is completely out, will your provide temporary rx? Please advise.

## 2011-04-19 NOTE — Patient Instructions (Signed)
Stay on lasix three times daily  Turn in the card so I can review CPAP settings

## 2011-04-19 NOTE — Telephone Encounter (Signed)
Please discuss this w/ Chelsea [name]- if she is [place] [facility] and it is causing problems we might not be able to see her (we don't have a [place] [facility] provider #)

## 2011-04-20 ENCOUNTER — Telehealth: Payer: Self-pay | Admitting: Pulmonary Disease

## 2011-04-20 MED ORDER — OXYCODONE-ACETAMINOPHEN 10-325 MG PO TABS: 1.0000 | ORAL_TABLET | ORAL | Status: DC | PRN

## 2011-04-20 MED ORDER — ALBUTEROL SULFATE HFA 108 (90 BASE) MCG/ACT IN AERS: 2.0000 | INHALATION_SPRAY | Freq: Four times a day (QID) | RESPIRATORY_TRACT | Status: DC | PRN

## 2011-04-20 NOTE — Telephone Encounter (Signed)
Called and spoke with pt and she is aware of albuterol has been sent to the pharmacy

## 2011-04-20 NOTE — Assessment & Plan Note (Signed)
30 lbs wt gain Increase lasix to 40 tid >> if no improvement in weight, will have to increase further. She is in contact with cards - have clarified triggers for her to call. No e/o pulmonary edema today

## 2011-04-20 NOTE — Telephone Encounter (Signed)
Called to speak with pt to let her know that albuterol has been sent to the pharmacy.  Unable to leave a message on machine due to inbox being full.  Will attempt to call pt back later to make her aware.

## 2011-04-20 NOTE — Telephone Encounter (Signed)
I spoke with pt and she notes that she was calling about Percocet. Pt was giving the wrong medication name to front staff. Pt notes that she is completely out of pills, took her last one this morning. Pt was advised that Dr. Laury Axon will provide temporary refill and it will be ready after lunch.

## 2011-04-20 NOTE — Assessment & Plan Note (Signed)
Severe, correctd by CPAP 18 with C-flex of 3 Weight loss encouraged, compliance with goal of at least 4-6 hrs every night is the expectation. Advised against medications with sedative side effects Cautioned against driving when sleepy - understanding that sleepiness will vary on a day to day basis

## 2011-04-23 ENCOUNTER — Ambulatory Visit: Payer: Medicare Other | Admitting: Family Medicine

## 2011-04-23 DIAGNOSIS — Z0289 Encounter for other administrative examinations: Secondary | ICD-10-CM

## 2011-04-23 NOTE — Progress Notes (Signed)
  Patient was seen on 04/19/2011 for the third of a series of three diabetes self-management courses at [facility]. The following learning objectives were met by the patient during this course:   Identifies nutrient effects on glycemia  States the general guidelines of meal planning  Relates understanding of personal meal plan  Describes situations that cause stress and discuss methods of stress management  Identifies lifestyle behaviors for change  Your patient has established the following 3 month goals for diabetes self-care:  Eat meals on time  Increase my exercise  Follow-Up Plan: Patient will attend a 3 month follow-up visit for diabetes self-management education.

## 2011-04-25 ENCOUNTER — Telehealth: Payer: Self-pay | Admitting: Family Medicine

## 2011-04-25 NOTE — Telephone Encounter (Signed)
If pain clinics feel she should see ortho we can switch the referral to ortho instead.

## 2011-04-25 NOTE — Telephone Encounter (Signed)
In reference to Referral to Pain Clinic, per phone call this morning from Dr. Chari Manning office, he is declining to see this patient, and is stating patient should be seen by Orthopaedic, not Pain Mgmt.  This is the 2nd Pain Clinic to decline, if you want me to try another I will.  Please advise.

## 2011-04-26 ENCOUNTER — Telehealth: Payer: Self-pay | Admitting: Family Medicine

## 2011-04-26 NOTE — Telephone Encounter (Signed)
Spoke w/ pt says that the swelling started since starting gabapentin and they feel really tight. Says she already is on fluid pill but isn't getting much relief. Would like to know what she can do.

## 2011-04-26 NOTE — Telephone Encounter (Signed)
Pt.notified

## 2011-04-26 NOTE — Telephone Encounter (Signed)
Swelling is a side effect of gabapentin.  Will need to balance her pain relief w/ her discomfort from swelling.  Can decrease gabapentin to twice daily if needed.  Her swelling may also be related to the heat and humidity.  Remaining active and elevating her legs will help.

## 2011-04-27 ENCOUNTER — Telehealth: Payer: Self-pay | Admitting: Family Medicine

## 2011-04-27 ENCOUNTER — Ambulatory Visit (INDEPENDENT_AMBULATORY_CARE_PROVIDER_SITE_OTHER): Payer: Medicare Other | Admitting: Family Medicine

## 2011-04-27 ENCOUNTER — Other Ambulatory Visit: Payer: Self-pay | Admitting: Family Medicine

## 2011-04-27 ENCOUNTER — Encounter: Payer: Self-pay | Admitting: Family Medicine

## 2011-04-27 DIAGNOSIS — L03119 Cellulitis of unspecified part of limb: Secondary | ICD-10-CM

## 2011-04-27 DIAGNOSIS — R609 Edema, unspecified: Secondary | ICD-10-CM

## 2011-04-27 DIAGNOSIS — G5790 Unspecified mononeuropathy of unspecified lower limb: Secondary | ICD-10-CM

## 2011-04-27 DIAGNOSIS — L02419 Cutaneous abscess of limb, unspecified: Secondary | ICD-10-CM

## 2011-04-27 DIAGNOSIS — M792 Neuralgia and neuritis, unspecified: Secondary | ICD-10-CM

## 2011-04-27 MED ORDER — FUROSEMIDE 40 MG PO TABS: 80.0000 mg | ORAL_TABLET | Freq: Three times a day (TID) | ORAL | Status: DC

## 2011-04-27 MED ORDER — OXYCODONE-ACETAMINOPHEN 10-325 MG PO TABS: 1.0000 | ORAL_TABLET | ORAL | Status: DC | PRN

## 2011-04-27 MED ORDER — DOXYCYCLINE HYCLATE 100 MG PO TABS: 100.0000 mg | ORAL_TABLET | Freq: Two times a day (BID) | ORAL | Status: AC

## 2011-04-27 NOTE — Progress Notes (Signed)
  Subjective:    Patient ID: Chelsea [name], female    DOB: September 23, 1966, 45 y.o.   MRN: [id number]  HPI Leg swelling- reports legs are 'real real tight'.  Having difficulty getting shoes on.  Has been taking Lasix 40mg  tid for 4 days.  sxs worsened 1 week ago.  Also feels that breathing is worse today but feels this may be due to heat/humidity.  Weight is stable from visit w/ Dr Vassie Loll on 7/19.  Has been elevating legs w/out success.  Feels swelling corresponds to increased dose of neurontin- which is helping her neuropathic pain.   Review of Systems For ROS see HPI     Objective:   Physical Exam  Vitals reviewed. Constitutional:       Morbidly obese, obviously uncomfortable, nasal O2 in place  Neck: Neck supple.  Cardiovascular: Normal rate, regular rhythm, normal heart sounds and intact distal pulses.   Pulmonary/Chest: Effort normal and breath sounds normal. No respiratory distress. She has no wheezes. She has no rales.  Musculoskeletal: She exhibits edema (3+ bilaterally w/ stasis changes and obvious warmth and erythema).  Lymphadenopathy:    She has no cervical adenopathy.  Skin: Skin is warm. There is erythema.          Assessment & Plan:   No problem-specific assessment & plan notes found for this encounter.

## 2011-04-27 NOTE — Telephone Encounter (Signed)
In reference to Order for Cardiac Rehab (Phone note 04-10-11), I have heard nothing, and Jamesetta So from [facility] keeps calling wanting a decision.

## 2011-04-27 NOTE — Telephone Encounter (Signed)
Per Dr. Beverely Low, the patient has been scheduled for Pulmonary Rehab instead of Cardiac.

## 2011-04-27 NOTE — Patient Instructions (Signed)
Follow up on Monday to recheck your legs If the redness advances past the line drawn- go to the ER! Start the Doxycycline tonight w/ dinner Increase the Lasix to 2 pills 3 times a day until the swelling decreases Make sure you are drinking enough water If you have worsening swelling or shortness of breath- please go to the ER Hang in there!

## 2011-04-27 NOTE — Telephone Encounter (Signed)
Attempted to call to notify pt rx ready to pick up. Voicemail box full.

## 2011-04-27 NOTE — Telephone Encounter (Signed)
Dr. Beverely Low, will you please enter an Order for Orthopaedics.

## 2011-04-27 NOTE — Telephone Encounter (Signed)
Ok for #60, no refills.  This is only to be used as needed.

## 2011-04-28 ENCOUNTER — Other Ambulatory Visit: Payer: Self-pay | Admitting: Family Medicine

## 2011-04-29 DIAGNOSIS — L03119 Cellulitis of unspecified part of limb: Secondary | ICD-10-CM | POA: Insufficient documentation

## 2011-04-29 DIAGNOSIS — L03115 Cellulitis of right lower limb: Secondary | ICD-10-CM | POA: Insufficient documentation

## 2011-04-29 NOTE — Assessment & Plan Note (Signed)
Pt w/ obvious cellulitis to just below level of knees bilaterally.  Lines were drawn to mark border.  Boyfriend was instructed that if redness advanced past that line over the weekend they needed to go to the ER.  Start doxy.  Reviewed supportive care and red flags that should prompt return.  Pt expressed understanding and is in agreement w/ plan.

## 2011-04-29 NOTE — Assessment & Plan Note (Signed)
Pt's pain is better on high dose neurontin but she is now having increased swelling.  Discussed at length w/ pt and husband that we will need to weigh the risk/benefits of continuing on high dose treatment.  Obviously pt needs something to help her pain but she cannot continue to have extreme swelling.  Encouraged her to decrease dose to BID or break tabs in half.  Will follow closely.

## 2011-04-29 NOTE — Assessment & Plan Note (Signed)
Likely multifactorial- neurontin, heat, chronic lymphedema, current cellulitis.  Increase lasix considerably to improve diuresis.  No indication of pulmonary edema on exam.  Pt to continue to elevate legs as much as possible, decrease dose of neurontin, and tx cellulitis.  Reviewed supportive care and red flags that should prompt return. Pt expressed understanding and is in agreement w/ plan.

## 2011-04-30 ENCOUNTER — Encounter: Payer: Self-pay | Admitting: Family Medicine

## 2011-04-30 ENCOUNTER — Ambulatory Visit (INDEPENDENT_AMBULATORY_CARE_PROVIDER_SITE_OTHER): Payer: Medicare Other | Admitting: Family Medicine

## 2011-04-30 DIAGNOSIS — R609 Edema, unspecified: Secondary | ICD-10-CM

## 2011-04-30 DIAGNOSIS — L03119 Cellulitis of unspecified part of limb: Secondary | ICD-10-CM

## 2011-04-30 DIAGNOSIS — L02419 Cutaneous abscess of limb, unspecified: Secondary | ICD-10-CM

## 2011-04-30 NOTE — Progress Notes (Signed)
  Subjective:    Patient ID: Chelsea [name], female    DOB: [birth year], 45 y.o.   MRN: [id number]  HPI Cellulitis- taking 80mg  of Lasix TID.  Down 9 lbs since Friday.  Was on feet all weekend.  Has 7 days of Doxy left.  Redness has improved.  Still painful but less so.   Review of Systems For ROS see HPI     Objective:   Physical Exam  Vitals reviewed. Constitutional: She appears well-developed and well-nourished. No distress.  Musculoskeletal: She exhibits edema (tense, +2 pitting edema).  Skin: Skin is warm and dry. There is erythema (less so than last visit, present on lower legs bilateral).          Assessment & Plan:   No problem-specific assessment & plan notes found for this encounter.

## 2011-04-30 NOTE — Telephone Encounter (Signed)
Okay to fill, I do not see a TSH has been checked here?

## 2011-04-30 NOTE — Patient Instructions (Signed)
Follow up if your swelling returns or the redness worsens Continue the Doxy as directed Continue the high dose Lasix until the swelling is less painful and then decrease to twice daily Call with any questions or concerns Hang in there!

## 2011-05-02 ENCOUNTER — Telehealth: Payer: Self-pay | Admitting: Family Medicine

## 2011-05-02 ENCOUNTER — Ambulatory Visit (INDEPENDENT_AMBULATORY_CARE_PROVIDER_SITE_OTHER): Payer: Medicare Other | Admitting: Family Medicine

## 2011-05-02 ENCOUNTER — Encounter: Payer: Self-pay | Admitting: Family Medicine

## 2011-05-02 DIAGNOSIS — L02419 Cutaneous abscess of limb, unspecified: Secondary | ICD-10-CM

## 2011-05-02 DIAGNOSIS — G5790 Unspecified mononeuropathy of unspecified lower limb: Secondary | ICD-10-CM

## 2011-05-02 DIAGNOSIS — L03119 Cellulitis of unspecified part of limb: Secondary | ICD-10-CM

## 2011-05-02 DIAGNOSIS — L0390 Cellulitis, unspecified: Secondary | ICD-10-CM

## 2011-05-02 DIAGNOSIS — M792 Neuralgia and neuritis, unspecified: Secondary | ICD-10-CM

## 2011-05-02 DIAGNOSIS — R609 Edema, unspecified: Secondary | ICD-10-CM

## 2011-05-02 LAB — BASIC METABOLIC PANEL
CO2: 37 mEq/L — ABNORMAL HIGH (ref 19–32)
Calcium: 8.9 mg/dL (ref 8.4–10.5)
Sodium: 140 mEq/L (ref 135–145)

## 2011-05-02 NOTE — Patient Instructions (Signed)
We'll notify you of your lab results You look so much better! Call with any questions or concerns Hang in there!

## 2011-05-02 NOTE — Progress Notes (Signed)
  Subjective:    Patient ID: Chelsea [name], female    DOB: November 10, 1965, 45 y.o.   MRN: [id number]  HPI Cellulitis- sxs continue to improve, legs are less swollen and redness is almost gone.  Has decreased lasix to 2 pills twice a day and 1 pill at night.  Neuropathic pain- taking the gabapentin bid due to swelling.   Review of Systems For ROS see HPI     Objective:   Physical Exam  Vitals reviewed. Constitutional: She appears well-developed and well-nourished. No distress.       Morbidly obese, O2 in place  Musculoskeletal: She exhibits edema (bilateral +1 edema).  Skin: Skin is warm and dry. There is erythema (much more mild compared to last visit).          Assessment & Plan:

## 2011-05-04 NOTE — Telephone Encounter (Signed)
She has a pulmonologist and they would order this for her

## 2011-05-04 NOTE — Telephone Encounter (Signed)
Discuss with patient  

## 2011-05-07 ENCOUNTER — Other Ambulatory Visit: Payer: Self-pay | Admitting: Family Medicine

## 2011-05-07 NOTE — Telephone Encounter (Signed)
Refill oxycodone - patient wants to pick up today Monday [id number]

## 2011-05-08 MED ORDER — OXYCODONE-ACETAMINOPHEN 10-325 MG PO TABS: 1.0000 | ORAL_TABLET | ORAL | Status: DC | PRN

## 2011-05-08 NOTE — Telephone Encounter (Signed)
Last seen 05/02/11 and filled 04/27/11 #60.    Please advise    KP

## 2011-05-08 NOTE — Telephone Encounter (Signed)
Pt aware Rx ready for pick up.    KP 

## 2011-05-08 NOTE — Assessment & Plan Note (Signed)
Pain fairly well controlled on gabapentin but had to decrease dose to BID due to edema.  Will continue to follow.

## 2011-05-08 NOTE — Assessment & Plan Note (Signed)
Redness continues to improve.  Pt to complete doxy as directed.

## 2011-05-08 NOTE — Assessment & Plan Note (Signed)
Pt's edema is less than previously.  Has decreased lasix to bid.  Did not get labs done at last visit- will get today to assess K+ and Cr.

## 2011-05-08 NOTE — Assessment & Plan Note (Signed)
Improving- pt w/ a week of doxy remaining.  Continue as directed

## 2011-05-08 NOTE — Assessment & Plan Note (Signed)
Pt down 9 lbs since increasing lasix dose.  Check BMP to assess K+ and Cr.  Continue high dose meds until swelling resolves.  Pt expressed understanding and is in agreement w/ plan.

## 2011-05-10 ENCOUNTER — Ambulatory Visit (INDEPENDENT_AMBULATORY_CARE_PROVIDER_SITE_OTHER): Payer: Medicare Other | Admitting: Family Medicine

## 2011-05-10 ENCOUNTER — Encounter: Payer: Self-pay | Admitting: Family Medicine

## 2011-05-10 DIAGNOSIS — R609 Edema, unspecified: Secondary | ICD-10-CM

## 2011-05-10 DIAGNOSIS — M792 Neuralgia and neuritis, unspecified: Secondary | ICD-10-CM

## 2011-05-10 DIAGNOSIS — G5790 Unspecified mononeuropathy of unspecified lower limb: Secondary | ICD-10-CM

## 2011-05-10 MED ORDER — OMEPRAZOLE 20 MG PO CPDR: 20.0000 mg | DELAYED_RELEASE_CAPSULE | Freq: Every day | ORAL | Status: DC

## 2011-05-10 MED ORDER — FERROUS SULFATE 325 (65 FE) MG PO TABS: 325.0000 mg | ORAL_TABLET | Freq: Two times a day (BID) | ORAL | Status: DC

## 2011-05-10 NOTE — Assessment & Plan Note (Signed)
Much improved.  No current evidence of cellulitis.  She is to self adjust her Lasix based on degree of swelling.  Pt expressed understanding and is in agreement w/ plan.

## 2011-05-10 NOTE — Patient Instructions (Signed)
We'll call you with your neuro appt Take 1-2 gabapentin daily for pain relief until your neuro appt Your legs look MUCH better!  Adjust the lasix accordingly Try and get your medicaid changed to regular medicaid instead of [place] [facility] Call with any questions or concerns Hang in there!

## 2011-05-10 NOTE — Assessment & Plan Note (Signed)
Pt has never had neuro eval for her neuropathy.  Will refer.

## 2011-05-10 NOTE — Progress Notes (Signed)
  Subjective:    Patient ID: ATINA FEELEY, female    DOB: 1966/07/05, 45 y.o.   MRN: [id number]  HPI Leg swelling- much improved since decreasing gabapentin.  No redness.  Has decreased amount of Lasix.  When she tried to resume gabapentin for the pain she again started swelling.  Neuropathy- has never seen neurologist for neuropathy.  Unable to take gabapentin due to swelling.   Review of Systems For ROS see HPI     Objective:   Physical Exam  Vitals reviewed. Constitutional: No distress.       Morbidly obese  Pulmonary/Chest: Effort normal and breath sounds normal. No respiratory distress. She has no wheezes. She has no rales.  Musculoskeletal: Edema: bilateral, +1, much less than previous, no current erythema.  Skin: Skin is warm and dry. No erythema.          Assessment & Plan:

## 2011-05-15 ENCOUNTER — Encounter: Payer: Self-pay | Admitting: Family Medicine

## 2011-05-16 ENCOUNTER — Other Ambulatory Visit: Payer: Self-pay | Admitting: Family Medicine

## 2011-05-16 MED ORDER — OXYCODONE-ACETAMINOPHEN 10-325 MG PO TABS: 1.0000 | ORAL_TABLET | ORAL | Status: DC | PRN

## 2011-05-16 NOTE — Telephone Encounter (Signed)
Rx printed

## 2011-05-22 ENCOUNTER — Ambulatory Visit (INDEPENDENT_AMBULATORY_CARE_PROVIDER_SITE_OTHER): Payer: Medicare Other | Admitting: Adult Health

## 2011-05-22 ENCOUNTER — Encounter: Payer: Self-pay | Admitting: Adult Health

## 2011-05-22 ENCOUNTER — Other Ambulatory Visit: Payer: Self-pay | Admitting: *Deleted

## 2011-05-22 ENCOUNTER — Telehealth: Payer: Self-pay | Admitting: *Deleted

## 2011-05-22 ENCOUNTER — Ambulatory Visit: Payer: Medicare Other | Admitting: Pulmonary Disease

## 2011-05-22 DIAGNOSIS — G4733 Obstructive sleep apnea (adult) (pediatric): Secondary | ICD-10-CM

## 2011-05-22 DIAGNOSIS — I509 Heart failure, unspecified: Secondary | ICD-10-CM

## 2011-05-22 DIAGNOSIS — I5032 Chronic diastolic (congestive) heart failure: Secondary | ICD-10-CM

## 2011-05-22 NOTE — Progress Notes (Signed)
Subjective:    Patient ID: Chelsea [name], female    DOB: 1966/08/28, 45 y.o.   MRN: [id number]  HPI  PCP - Tabori  44/F, morbidly obese for FU of obstructive sleep apnea . On O2 since 2004 after cervical CA surgery, got ON CPAP around the same time - [facility] .  admitted to [facility] in January with pneumonia and respiratory failure. Required [facility]ch ventilation at [facility] , then [facility], She was treated with antibiotics and diuretics with improvement. [facility]. She was weaned off BiPPA to O2 daytime & nocturnal CPAP. Echocardiogram in January of 2012 showed normal LV function, mild left atrial enlargement and mild aortic insufficiency. Labs were obtained on 2/14 and her BUN was 49 (up from 10 on 11/05/10); creatinine 1.3 (up from 0.72) and BNP was 84. Diuretics are being adjusted by cardiology.  Has gained 50 lbs since dc  Advair prior / spiriva started in [facility] - unclear if this is helping  Lisinopril noted on med review - denies cough  CXR - no effusions , improved from jan'12   12/29/2010  PSG reviewed >> Severe obstructive sleep apnea with hypopneas causing oxygen desaturation and sleep fragmentation. This was corrected by CPAP of 18 cm with a C-Flex of 3 cm with 2 L of oxygen blended in.  Limb movements seemed to emerge and increase on CPAP  At the time of this study, she weighed 322 pounds with a height of 5 feet 7 inches, BMI of 50 and neck size of 18 inches.  Hospitalised 3/9-3/17/12 for acute renal failure (on toresemide & metalazone ) & hypercarbic resp failure   7/19 /2012 Not taking perforomist On advair, not had to use albuterol Gained 30 lbs to 355 lbs , Increased lasix to 40 mg  tid  >>cont on CPAP and diuretics   05/22/2011 Follow up  Pt returns for follow up. Says she continues to have chronic leg swelling some better with decreased swelling on lasix. She is planning on pulmonary rehab and is going to try to get  involved in water aerobics.   Says she wears BIPAP  each night but not all night because she does not sleep good . Does nap or fall asleep in daytime without mask.  She is having difficulty with her DME-[facility]. Because recent download showed poor compliance . She has assured me she will wear on regular basis.   Weight is down 6 lbs since last visit to 349.  No Flare in cough or wheeizng, no ER/admission or increased SABA use    Review of Systems  Constitutional:   No  weight loss, night sweats,  Fevers, chills,  +fatigue, or  lassitude.  HEENT:   No headaches,  Difficulty swallowing,  Tooth/dental problems, or  Sore throat,                No sneezing, itching, ear ache, nasal congestion, post nasal drip,   CV:  No chest pain,  Orthopnea, PND,   anasarca, dizziness, palpitations, syncope.   GI  No heartburn, indigestion, abdominal pain, nausea, vomiting, diarrhea, change in bowel habits, loss of appetite, bloody stools.   Resp:+ shortness of breath with exertion    No excess mucus, no productive cough,  No non-productive cough,  No coughing up of blood.  No change in color of mucus.  No wheezing.  No chest wall deformity  Skin: no rash or lesions.  GU: no  dysuria, change in color of urine, no urgency or frequency.  No flank pain, no hematuria   MS:  No joint pain or swelling.  No decreased range of motion   Psych:   .  No memory loss.         Objective:   Physical Exam  Gen. Pleasant, obese, in no distress ENT - no lesions, no post nasal drip Neck: No JVD, no thyromegaly, no carotid bruits Lungs: no use of accessory muscles, no dullness to percussion, clear without rales or rhonchi  Cardiovascular: Rhythm regular, heart sounds  normal, no murmurs or gallops, 1-2 + peripheral edema Musculoskeletal: No deformities, no cyanosis or clubbing ,  stasis dermatatic changes w/ brawny skin changes         Assessment & Plan:

## 2011-05-22 NOTE — Patient Instructions (Signed)
Stop Perforomist.  Continue on Advair 1 puff Twice daily  -brush/rinse and gargle  Wear BIPAP each night at least 6 hr and with naps.  We will contact [facility] to discuss download  Low salt diet , keep legs elevated -  Water aerobics as tolerated Pulmonary rehab as planned  Look for flu shot next month.    follow up Dr. Vassie Loll  In 3 months and As needed

## 2011-05-22 NOTE — Assessment & Plan Note (Signed)
Advised on compliance with her sleep machine  Will do download in 2 weeks to verify compliance  If [employer] will not continue will need to change services with [facility] or other DME as  Pt does need this machine if she is going to compliant.

## 2011-05-22 NOTE — Assessment & Plan Note (Signed)
Chronic diastolic dysfunction w/ tendency toward fluid overload and peripheral edema  Cont on  Current diuretic regimen.  Encouraged on weight loss w/ exercise  - water aerobics Low salt diet.  Leg elevated

## 2011-05-22 NOTE — Telephone Encounter (Signed)
lmomtcb x 1.   1-Per TP-pt needs to wear BiBAP every night for 6+ hours, get download in 2 weeks  2-If [facility] Pt says no then we will have to change to [facility] [place] (if pt has to do another sleep study it will be out of pocket, not an option).  3-OV 6 weeks with RA (October schedule is not out yet, reminder placed).

## 2011-05-24 NOTE — Telephone Encounter (Signed)
Pt is aware of plan, called [facility] pt and was unable to speak with a tech. lmtcb x 1

## 2011-05-24 NOTE — Telephone Encounter (Signed)
Kelly from [employer] pt called back and said it is an insurance and not DME issue, that she tried to work with pt very hard to the point pt stopped taking her calls. The "90 day" period was lapsed but she will talk with her manager to see what can be done.

## 2011-05-25 ENCOUNTER — Telehealth: Payer: Self-pay | Admitting: Family Medicine

## 2011-05-25 MED ORDER — OXYCODONE-ACETAMINOPHEN 10-325 MG PO TABS: 1.0000 | ORAL_TABLET | ORAL | Status: DC | PRN

## 2011-05-25 NOTE — Telephone Encounter (Signed)
Informed Almyra Free of the situation we have and with Medicare guidelines the only option (outside of starting over with lab sleep study) is home sleep study "[facility]" regardless of the DME company. Almyra Free states this should be covered under Medicare. Please advise. Thanks.

## 2011-05-25 NOTE — Telephone Encounter (Signed)
Pt was given #60 9 days ago.  If she is taking this medicine every 4 hrs as directed she would run out tomorrow.  Since she has to be sleeping at some point, and this medicine is written as needed, there is no way she should be out of meds.  She can have #60 no refills but will not get another refill before 9/13.

## 2011-05-25 NOTE — Telephone Encounter (Signed)
Last OV 05-10-11, Last filled 05-16-11 #60

## 2011-05-25 NOTE — Telephone Encounter (Signed)
Brett Canales with [facility] returned call from [place] r- re: bipap. Cell [phone].

## 2011-05-25 NOTE — Telephone Encounter (Signed)
Spoke to [place] with [facility] who advised the same as Tresa Endo due to Medicare guidelines unable to continue to bill Medicare for current BiPAP but can sell her one of their older machines.

## 2011-05-25 NOTE — Telephone Encounter (Signed)
Discuss with patient who states that she sometimes have to take a extra dose due to the swelling. Pt also notes that she cannot take gabapentin on regular basis.    Rx printed and placed up front for pickup.

## 2011-05-25 NOTE — Telephone Encounter (Signed)
Options are  - pt can buy one of their old machines (since honestly, she was non compliant) -OR home study

## 2011-05-25 NOTE — Telephone Encounter (Signed)
lmovmtcb x 1

## 2011-05-25 NOTE — Telephone Encounter (Signed)
Not appropriate treatment for swelling and this has been discussed w/ pt.  Will revisit this at future visits.

## 2011-05-25 NOTE — Telephone Encounter (Signed)
If Medicare will cover and take Home sleep study that will work  Or can we switch to [facility] and use old sleep study?

## 2011-05-30 ENCOUNTER — Encounter: Payer: Self-pay | Admitting: Family Medicine

## 2011-05-30 ENCOUNTER — Ambulatory Visit (INDEPENDENT_AMBULATORY_CARE_PROVIDER_SITE_OTHER): Payer: Medicare Other | Admitting: Family Medicine

## 2011-05-30 VITALS — BP 122/90 | HR 82 | Temp 98.6°F | Wt 350.4 lb

## 2011-05-30 DIAGNOSIS — J40 Bronchitis, not specified as acute or chronic: Secondary | ICD-10-CM | POA: Insufficient documentation

## 2011-05-30 DIAGNOSIS — R05 Cough: Secondary | ICD-10-CM

## 2011-05-30 DIAGNOSIS — G5790 Unspecified mononeuropathy of unspecified lower limb: Secondary | ICD-10-CM

## 2011-05-30 DIAGNOSIS — M792 Neuralgia and neuritis, unspecified: Secondary | ICD-10-CM

## 2011-05-30 HISTORY — DX: Bronchitis, not specified as acute or chronic: J40

## 2011-05-30 MED ORDER — BENZONATATE 200 MG PO CAPS: 200.0000 mg | ORAL_CAPSULE | Freq: Three times a day (TID) | ORAL | Status: AC | PRN

## 2011-05-30 MED ORDER — AZITHROMYCIN 250 MG PO TABS: 250.0000 mg | ORAL_TABLET | Freq: Every day | ORAL | Status: AC

## 2011-05-30 NOTE — Telephone Encounter (Signed)
Pt states she wants to try the sleep study at home. Order sent to [facility].

## 2011-05-30 NOTE — Progress Notes (Signed)
  Subjective:    Patient ID: Chelsea [name], female    DOB: 04/20/1966, 45 y.o.   MRN: [id number]  HPI Congestion- pt reports she started sneezing and then developed nasal and chest congestion.  sxs started Sunday.  No fevers.  Cough is intermittently productive.  No ear pain.  + HAs.  No sore throat.  No known sick contacts.  Hx of underlying lung dz- on O2 chronically.  Neuropathy- pt reports she is no longer taking the gabapentin due to swelling.  States she is taking at least 6 pain pills daily 'so i can function'.  Reports she knows she will be out of pain meds early but is not 'able' to take them as directed b/c it's 'not enough'.   Review of Systems For ROS see HPI     Objective:   Physical Exam  Vitals reviewed. Constitutional:       Morbidly obese, NAD but always appears uncomfortable w/ ambulation  HENT:  Head: Normocephalic and atraumatic.  Mouth/Throat: Oropharynx is clear and moist. No oropharyngeal exudate.       No TTP over sinuses TMs normal bilaterally  Neck: Normal range of motion. Neck supple.  Cardiovascular: Normal rate and regular rhythm.   Murmur (II/VI SEM) heard. Pulmonary/Chest: Effort normal. No respiratory distress. She has no wheezes. She has rales (coarse BS bilaterally, no wheezes heard).       + hacking cough  Musculoskeletal: She exhibits edema (bilateral LEs).  Lymphadenopathy:    She has no cervical adenopathy.  Skin: Skin is warm and dry.          Assessment & Plan:

## 2011-05-30 NOTE — Assessment & Plan Note (Signed)
Pt's sxs consistent w/ bronchitis.  Due to underlying lung dz will start abx to prevent worsening infxn.  Reviewed supportive care and red flags that should prompt return.  Pt expressed understanding and is in agreement w/ plan.

## 2011-05-30 NOTE — Assessment & Plan Note (Signed)
Again discussed w/ pt that narcotics are not appropriate tx for neuropathy.  Pt has exam upcoming w/ neuro.  Told pt that even taking meds every 4 hrs that would be max of 6 pills daily and she has to be sleeping at some point.  Pt admits she doesn't need the meds when she sleeps but reports she often takes more than 6 pills/day.  This is not appropriate use of medication but rather than trying to change her inappropriate ways she got upset w/ me for trying to place limits on amount of meds taken.  Will provide narcotics until pt is able to see neuro but not after that.

## 2011-05-30 NOTE — Patient Instructions (Signed)
Take the Azithromycin for possible bronchitis Use the Tessalon for cough as needed Add Mucinex to thin your congestion Rest! Call with any questions or concerns Hang in there!!!

## 2011-06-01 ENCOUNTER — Other Ambulatory Visit: Payer: Self-pay | Admitting: Family Medicine

## 2011-06-01 NOTE — Telephone Encounter (Signed)
Refill oxycodone -patient said she will be out of med [phone] wants to pick up today since we are closed Monday [phone]

## 2011-06-01 NOTE — Telephone Encounter (Signed)
Discussed with patient and she voiced understanding, has been advised to go to the ER in severe pain, per Dr.Lowne and she voiced understanding   KP

## 2011-06-01 NOTE — Telephone Encounter (Signed)
Last filled 05/25/11 #60   Requesting another refill   Please advise  KP

## 2011-06-05 ENCOUNTER — Other Ambulatory Visit: Payer: Self-pay | Admitting: Family Medicine

## 2011-06-05 NOTE — Telephone Encounter (Signed)
Patient caled to check status--says she is out --needs to pick up today--I said I would let nurse know

## 2011-06-05 NOTE — Telephone Encounter (Signed)
Pt called again requesting refill of pain meds. Notified pt that Dr. Beverely Low is out of the office and Rx will be addressed at the start of business tomorrow

## 2011-06-05 NOTE — Telephone Encounter (Signed)
Patient wants refill for oxycodone - see previous note - patient said she went to [facility] urgent care  Over the weekend

## 2011-06-06 ENCOUNTER — Encounter: Payer: Medicare Other | Admitting: Cardiology

## 2011-06-06 MED ORDER — TRAMADOL HCL 50 MG PO TABS: 50.0000 mg | ORAL_TABLET | Freq: Four times a day (QID) | ORAL | Status: AC | PRN

## 2011-06-06 NOTE — Telephone Encounter (Signed)
Pt states that she went to [facility] ED over the weekend and was rx'd clindamycin, metaxalone, and percocet #30 for leg edema

## 2011-06-06 NOTE — Progress Notes (Signed)
HPI: Chelsea [name] is a pleasant female with diastolic CHF. She was admitted to [facility] in January 2012 with pneumonia and respiratory failure. [facility]. She has had issues with not understanding her medications and taking varying amounts of diuretics in the past. She was admitted in March 2012 with acute respiratory insufficiency which was felt to be multifactorial and included obstructive sleep apnea and congestive heart failure. Also she had acute renal failure and required pressors at one point. Echocardiogram showed an ejection fraction of 60-65% and a mildly elevated LVOT gradient felt secondary to hyperdynamic function or mild AS. There was a mildly elevated gradient in her thoracic aorta. There was trace AI.  She was admitted again from the office 5/23 with acute on chronic diastolic heart failure. Echocardiogram this time suggested mild to moderate aortic stenosis as well as increased gradients in her thoracic aorta. She had the TEE on 7/2. This demonstrated normal LVF, mild AS with a mean gradient of 16. There was no evidence of coarctation. She was last seen by Tereso Newcomer in July of 2012. Since then,    Current Outpatient Prescriptions  Medication Sig Dispense Refill  . albuterol (VENTOLIN HFA) 108 (90 BASE) MCG/ACT inhaler Inhale 2 puffs into the lungs every 6 (six) hours as needed for wheezing.  1 Inhaler  5  . aspirin 81 MG tablet Take 81 mg by mouth daily.        . benzonatate (TESSALON) 200 MG capsule Take 1 capsule (200 mg total) by mouth 3 (three) times daily as needed for cough.  60 capsule  0  . celecoxib (CELEBREX) 200 MG capsule Take 200 mg by mouth daily. Or as directed      . famotidine (PEPCID) 20 MG tablet Take 20 mg by mouth 2 (two) times daily.        . ferrous sulfate 325 (65 FE) MG tablet Take 1 tablet (325 mg total) by mouth 2 (two) times daily.  60 tablet  6  .  Fluticasone-Salmeterol (ADVAIR DISKUS) 500-50 MCG/DOSE AEPB Inhale 1 puff into the lungs every 12 (twelve) hours.        . furosemide (LASIX) 40 MG tablet Take 40 mg by mouth 2 (two) times daily.        Marland Kitchen gabapentin (NEURONTIN) 600 MG tablet Take 600 mg by mouth daily as needed.        Marland Kitchen glucosamine-chondroitin 500-400 MG tablet Take 1 tablet by mouth 2 (two) times daily.        . insulin detemir (LEVEMIR) 100 UNIT/ML injection Inject 65 Units into the skin at bedtime. As directed      . levothyroxine (SYNTHROID, LEVOTHROID) 150 MCG tablet TAKE 1 TABLET EVERY DAY  90 tablet  2  . Multiple Vitamin (MULTIVITAMIN) tablet Take 1 tablet by mouth daily.        . NON FORMULARY oxige 2 1/2 litt       . omeprazole (PRILOSEC) 20 MG capsule Take 1 capsule (20 mg total) by mouth daily.  30 capsule  6  . oxyCODONE-acetaminophen (PERCOCET) 10-325 MG per tablet Take 1 tablet by mouth every 4 (four) hours as needed.  60 tablet  0  . potassium chloride SA (K-DUR,KLOR-CON) 20 MEQ tablet Take 20 mEq by mouth 3 (three) times daily.        Marland Kitchen triamcinolone (KENALOG) 0.1 % ointment Apply topically 2 (two) times daily.  90 g  1  Past Medical History  Diagnosis Date  . GERD (gastroesophageal reflux disease)   . Hypothyroid   . Hypertension   . OSA (obstructive sleep apnea)   . Obesity hypoventilation syndrome   . Diastolic congestive heart failure     Echo 02/21/11: EF 55-60%, moderate LVH, grade 2 diastolic dysfunction, mild to moderate aortic stenosis with a mean gradient 23 mm of mercury  . Cervical cancer   . Diabetes mellitus   . Hyperlipidemia   . Aortic stenosis     Echocardiogram 02/21/11:  Mean gradient 23 mm of mercury; peak gradient 36;; Turbulence noted in the area of the aortic isthmus with peak gradient 23 mmHg-consider MRI to assess for coarctation;    TEE: 04/02/11:  EF 55-60%, mod BAE, trileaflet AV with mild AS (pk and mean 25 and 16), mild AI, mild MR, mild TR, atrial septal aneurysm, no  evidence of corarctation  . COPD (chronic obstructive pulmonary disease)     Past Surgical History  Procedure Date  . Cholecystectomy   . Tonsillectomy   . Abdominal hysterectomy     History   Social History  . Marital Status: Single    Spouse Name: N/A    Number of Children: N/A  . Years of Education: N/A   Occupational History  . disabled    Social History Main Topics  . Smoking status: Never Smoker   . Smokeless tobacco: Never Used  . Alcohol Use: 0.0 oz/week     Rarely  . Drug Use: No  . Sexually Active: Not on file   Other Topics Concern  . Not on file   Social History Narrative  . No narrative on file    ROS: no fevers or chills, productive cough, hemoptysis, dysphasia, odynophagia, melena, hematochezia, dysuria, hematuria, rash, seizure activity, orthopnea, PND, pedal edema, claudication. Remaining systems are negative.  Physical Exam: Well-developed well-nourished in no acute distress.  Skin is warm and dry.  HEENT is normal.  Neck is supple. No thyromegaly.  Chest is clear to auscultation with normal expansion.  Cardiovascular exam is regular rate and rhythm.  Abdominal exam nontender or distended. No masses palpated. Extremities show no edema. neuro grossly intact  ECG     This encounter was created in error - please disregard.

## 2011-06-06 NOTE — Telephone Encounter (Signed)
There is no way pt should have taken 3 percocet in 3 days.  This overuse is not appropriate and this has been discussed w/ pt.  No refill at this time.  Nikki- please start dismissal process for misuse of controlled substances.  Taking meds more than directed, calling for refills early, calling multiple times/day.

## 2011-06-06 NOTE — Telephone Encounter (Signed)
Per Dr. Beverely Low, send Ultram 50 mg #60 w/ 3 refills to pharmacy.  Rx sent

## 2011-06-06 NOTE — Telephone Encounter (Signed)
Left message advising pt to return call to office for clarification on urgent care visit in [place] over the weekend

## 2011-06-07 ENCOUNTER — Encounter: Payer: Self-pay | Admitting: Family Medicine

## 2011-06-18 ENCOUNTER — Telehealth: Payer: Self-pay | Admitting: Cardiology

## 2011-06-18 NOTE — Telephone Encounter (Signed)
Chelsea [name] from [facility] and orders faxed to her she is in rehab office

## 2011-06-19 ENCOUNTER — Telehealth: Payer: Self-pay | Admitting: Cardiology

## 2011-06-19 NOTE — Telephone Encounter (Signed)
A renew order in a prescription pad  for physical therapy faxed  to Joni at [name] rehab. Jani aware.

## 2011-06-19 NOTE — Telephone Encounter (Signed)
The tech Lennox Laity from [facility] rehab ctr called yesterday to ask about an order for an appt for today and she still has not gotten the order yet so they are calling again to get it because pt is coming pls fax to [phone]

## 2011-06-19 NOTE — Telephone Encounter (Signed)
Spoke with Lonnie from [facility] rehabilitation center regarding pt needing to have an update order for rehab. Patient was referred to Physical therapy in [facility] for  Lymphedema  On 0-6/21/12. According to technician  Joni patient has been on the waiting list, now patient needs a new order for rehab. With MD's signature. Lennox Laity to fax form back.

## 2011-06-20 NOTE — Telephone Encounter (Signed)
Rehab office calling  has question regarding her ej fraction / edema in leg.

## 2011-06-21 ENCOUNTER — Telehealth: Payer: Self-pay | Admitting: Pulmonary Disease

## 2011-06-21 NOTE — Telephone Encounter (Signed)
ATC pt.  Pt picked up but had bad signal and the call was dropped.

## 2011-06-21 NOTE — Telephone Encounter (Signed)
Chelsea [name] AWARE PT'S EF WAS 55-60% BACK IN July 2012./CY

## 2011-06-21 NOTE — Telephone Encounter (Signed)
Called and spoke with pt.  Pt states she has "already gotten this taken care" and nothing further needed.  Will sign off on message.

## 2011-06-26 ENCOUNTER — Telehealth: Payer: Self-pay | Admitting: Pulmonary Disease

## 2011-06-26 NOTE — Telephone Encounter (Signed)
LMTCB

## 2011-06-27 NOTE — Telephone Encounter (Signed)
lmomtcb  

## 2011-06-28 NOTE — Telephone Encounter (Signed)
lmomtcb  

## 2011-06-29 NOTE — Telephone Encounter (Signed)
Spoke with pt and she states has already spoken with Almyra Free and nothing further needed.

## 2011-07-04 ENCOUNTER — Other Ambulatory Visit: Payer: Self-pay | Admitting: Pulmonary Disease

## 2011-07-04 DIAGNOSIS — G4733 Obstructive sleep apnea (adult) (pediatric): Secondary | ICD-10-CM

## 2011-07-10 ENCOUNTER — Encounter: Payer: Self-pay | Admitting: Adult Health

## 2011-07-13 ENCOUNTER — Other Ambulatory Visit: Payer: Self-pay | Admitting: Family Medicine

## 2011-07-16 ENCOUNTER — Ambulatory Visit (HOSPITAL_BASED_OUTPATIENT_CLINIC_OR_DEPARTMENT_OTHER): Payer: Medicare Other | Attending: Pulmonary Disease

## 2011-07-16 DIAGNOSIS — G4733 Obstructive sleep apnea (adult) (pediatric): Secondary | ICD-10-CM | POA: Insufficient documentation

## 2011-07-16 DIAGNOSIS — Z79899 Other long term (current) drug therapy: Secondary | ICD-10-CM | POA: Insufficient documentation

## 2011-07-26 DIAGNOSIS — G4710 Hypersomnia, unspecified: Secondary | ICD-10-CM

## 2011-07-26 DIAGNOSIS — G4730 Sleep apnea, unspecified: Secondary | ICD-10-CM

## 2011-07-27 ENCOUNTER — Telehealth: Payer: Self-pay | Admitting: Pulmonary Disease

## 2011-07-27 DIAGNOSIS — G4733 Obstructive sleep apnea (adult) (pediatric): Secondary | ICD-10-CM

## 2011-07-27 NOTE — Telephone Encounter (Signed)
Study clarified biPAP requirement - order sent to DME BiPAP 20/14 cm, small full face mask, humidity , biflex +3 cm, download in 1 month PLMs noted

## 2011-07-27 NOTE — Procedures (Signed)
Chelsea [name], SLABY          ACCOUNT NO.:  [id number]  MEDICAL RECORD NO.:  [id number]          PATIENT TYPE:  OUT  LOCATION:  SLEEP CENTER                 FACILITY:  [facility]  PHYSICIAN:  Oretha Milch, MD      DATE OF BIRTH:  05-21-1966  DATE OF STUDY:  07/16/2011                           NOCTURNAL POLYSOMNOGRAM  REFERRING PHYSICIAN:  Oretha Milch, MD  INDICATION FOR STUDY:  Chelsea [name] is a 45 year old morbidly obese woman with severe obstructive sleep apnea.  She was maintained on BiPAP of 12/6 and a CPAP titration study in March 2012, had shown a CPAP requirement of 18 cm with a C-Flex of 3 cm with 2 L of oxygen blended in.  At this time,  her BMI was 55.0 with a weight of 322 pounds.  This repeat study was performed due to persistent symptoms.  At the time of this study, she weighed 322 pounds, height of 5 feet 7 inches, BMI 50. Neck size 18 inches.  EPWORTH SLEEPINESS SCORE:  9.  MEDICATIONS:  Included levothyroxine, omeprazole, furosemide, aspirin, Celebrex, chondroitin, glucosamine, Advil, albuterol.  This CPAP titration study was performed with a sleep technologist  in attendance.  EEG, EOG, EMG, EKG, and respiratory parameters were recorded.  Sleep stages arousals, limb movements, and respiratory data were scored according to criteria laid out by the American Academy of Sleep Medicine.  SLEEP ARCHITECTURE:  Lights out was at 11:52 p.m.  Lights on was at 5:51 a.m.  Total sleep time was 268 minutes with a sleep period time of 311 minutes with a sleep efficiency of 95%.  Sleep latency was 38 minutes. Awake after sleep onset was 54 minutes.  Sleep stages with the percentage of total sleep time was N1 5%, N2 57%, N3 27%.  REM sleep 11.5% (31 minutes).  Supine sleep was seen for 74 minutes, and supine REM sleep for only 3 minutes.  REM sleep was noted in 2 stages, longest around 4:30 am.  AROUSAL DATA:  There were total 100 arousals with an arousal index of  22 events per hour.  Of these, 30 were spontaneous 61 were associated with periodic limb movements and the rest with respiratory events.  RESPIRATORY DATA:  She was initiated at CPAP of 12 cm, which was titrated due to respiratory events and snoring to a CPAP of 16 cm.  At this level, 443 minutes, 4 hypopneas were noted with an AHI of 5.6 and the lowest desaturation of 86%.  She was then transitioned to BiPAP and at a final level of 20/14, 458 minutes including 8.5 minutes of REM sleep, four hypopneas were noted.  An AHI of 40 events per hour and lowest desaturation of 89%, 1 L of oxygen was blended with CPAP and BiPAP, a Bi-Flex setting of +3 cm was used.  OXYGEN DATA:  Oxygen saturation data, the lowest desaturation was 81%. The desaturation index was 17 events per hour.  She spent 9 minutes with saturation less than 88%.  LIMB MOVEMENT DATA:  The periodic limb movement index was 119 events per hour.  PLM arousal index was about 14 events per hour.  CARDIAC DATA:  The low heart rate was 33 beats per  minute.  The high heart rate recorded was an artifact.  No arrhythmias were noted.  MOVEMENT-PARASOMNIA:none noted  DISCUSSION:  BiPAP was used due to high CPAP pressure requirement. Events were optimally controlled on 21/14 cm, and saturations were corrected with 1 L of oxygen blended in.  She was desensitized with a small full-face mask and felt rested the morning after the study.  IMPRESSIONS-RECOMMENDATIONS: 1. Severe obstructive sleep apnea with hypopneas causing sleep     fragmentation oxygen desaturation. 2. This was corrected with bilevel positive airway pressure of 20/14     with 1 L of oxygen blended in with a Bi-Flex level of +3. 3. The periodic leg movement index was high and these was associated     with arousals and significance of this is unclear. 4. No evidence of cardiac arrhythmias or behavioral disturbance during     sleep.  Recommend: 1. Weight loss should be  encouraged. 2. Bilevel can be changed to 20/14 cm with a Bi-Flex of +3 cm with 1 L     of oxygen blended in with small Quattro full-face mask.     Compliance should be monitored at this level. 3. She should be advised against medication sedative side effects.     She should be cautioned against driving when sleepy. 4. Clinical correlation with symptoms of restless legs syndrome.  The     ferritin level and saturation can be checked.     Oretha Milch, MD    RVA/MEDQ  D:  07/26/2011 16:00:07  T:  07/27/2011 00:36:14  Job:  [id number]

## 2011-07-31 NOTE — Telephone Encounter (Signed)
PT CALLED. WANTS SLEEP STUDY RESULTS FAXED TO [facility] IN [place]. PT CAN BE REACHED AT [facility] PHONE #. Chelsea [name]

## 2011-07-31 NOTE — Telephone Encounter (Signed)
Called and spoke with DME [facility] rep and she advised they do not need anything from [place] at this time. I called pt to check what she is speaking about, no answer, lmomtcb x1.

## 2011-08-08 ENCOUNTER — Telehealth: Payer: Self-pay | Admitting: *Deleted

## 2011-08-08 NOTE — Telephone Encounter (Signed)
Faxed form to Erin manually 08-08-11

## 2011-08-31 ENCOUNTER — Ambulatory Visit: Payer: Medicare Other | Admitting: Cardiology

## 2011-10-19 ENCOUNTER — Ambulatory Visit: Payer: Medicare Other | Admitting: Cardiology

## 2011-10-23 ENCOUNTER — Encounter (HOSPITAL_COMMUNITY): Payer: Self-pay

## 2011-10-23 ENCOUNTER — Encounter (HOSPITAL_COMMUNITY)
Admission: RE | Admit: 2011-10-23 | Discharge: 2011-10-23 | Disposition: A | Discharge status: Home / Self Care | Payer: Medicare Other | Source: Ambulatory Visit | Attending: Cardiology | Admitting: Cardiology

## 2011-10-23 DIAGNOSIS — E662 Morbid (severe) obesity with alveolar hypoventilation: Secondary | ICD-10-CM | POA: Insufficient documentation

## 2011-10-23 DIAGNOSIS — Z7982 Long term (current) use of aspirin: Secondary | ICD-10-CM | POA: Insufficient documentation

## 2011-10-23 DIAGNOSIS — E039 Hypothyroidism, unspecified: Secondary | ICD-10-CM | POA: Insufficient documentation

## 2011-10-23 DIAGNOSIS — J449 Chronic obstructive pulmonary disease, unspecified: Secondary | ICD-10-CM | POA: Insufficient documentation

## 2011-10-23 DIAGNOSIS — K219 Gastro-esophageal reflux disease without esophagitis: Secondary | ICD-10-CM | POA: Insufficient documentation

## 2011-10-23 DIAGNOSIS — Z5189 Encounter for other specified aftercare: Secondary | ICD-10-CM | POA: Insufficient documentation

## 2011-10-23 DIAGNOSIS — Z79899 Other long term (current) drug therapy: Secondary | ICD-10-CM | POA: Insufficient documentation

## 2011-10-23 DIAGNOSIS — E0590 Thyrotoxicosis, unspecified without thyrotoxic crisis or storm: Secondary | ICD-10-CM | POA: Insufficient documentation

## 2011-10-23 DIAGNOSIS — I5032 Chronic diastolic (congestive) heart failure: Secondary | ICD-10-CM | POA: Insufficient documentation

## 2011-10-23 DIAGNOSIS — I10 Essential (primary) hypertension: Secondary | ICD-10-CM | POA: Insufficient documentation

## 2011-10-23 DIAGNOSIS — I2510 Atherosclerotic heart disease of native coronary artery without angina pectoris: Secondary | ICD-10-CM | POA: Insufficient documentation

## 2011-10-23 DIAGNOSIS — E785 Hyperlipidemia, unspecified: Secondary | ICD-10-CM | POA: Insufficient documentation

## 2011-10-23 DIAGNOSIS — G4733 Obstructive sleep apnea (adult) (pediatric): Secondary | ICD-10-CM | POA: Insufficient documentation

## 2011-10-23 DIAGNOSIS — E119 Type 2 diabetes mellitus without complications: Secondary | ICD-10-CM | POA: Insufficient documentation

## 2011-10-23 DIAGNOSIS — I359 Nonrheumatic aortic valve disorder, unspecified: Secondary | ICD-10-CM | POA: Insufficient documentation

## 2011-10-23 DIAGNOSIS — J4489 Other specified chronic obstructive pulmonary disease: Secondary | ICD-10-CM | POA: Insufficient documentation

## 2011-10-23 DIAGNOSIS — I509 Heart failure, unspecified: Secondary | ICD-10-CM | POA: Insufficient documentation

## 2011-10-23 HISTORY — DX: Atherosclerotic heart disease of native coronary artery without angina pectoris: I25.10

## 2011-10-23 HISTORY — DX: Heart failure, unspecified: I50.9

## 2011-10-25 ENCOUNTER — Ambulatory Visit (HOSPITAL_COMMUNITY): Payer: Medicare Other

## 2011-10-30 ENCOUNTER — Encounter (HOSPITAL_COMMUNITY): Payer: Medicare Other | Attending: Cardiology

## 2011-10-30 ENCOUNTER — Ambulatory Visit (HOSPITAL_COMMUNITY): Payer: Medicare Other

## 2011-10-30 DIAGNOSIS — J4489 Other specified chronic obstructive pulmonary disease: Secondary | ICD-10-CM | POA: Insufficient documentation

## 2011-10-30 DIAGNOSIS — Z7982 Long term (current) use of aspirin: Secondary | ICD-10-CM | POA: Insufficient documentation

## 2011-10-30 DIAGNOSIS — J449 Chronic obstructive pulmonary disease, unspecified: Secondary | ICD-10-CM | POA: Insufficient documentation

## 2011-10-30 DIAGNOSIS — E039 Hypothyroidism, unspecified: Secondary | ICD-10-CM | POA: Insufficient documentation

## 2011-10-30 DIAGNOSIS — I359 Nonrheumatic aortic valve disorder, unspecified: Secondary | ICD-10-CM | POA: Insufficient documentation

## 2011-10-30 DIAGNOSIS — E119 Type 2 diabetes mellitus without complications: Secondary | ICD-10-CM | POA: Insufficient documentation

## 2011-10-30 DIAGNOSIS — I2510 Atherosclerotic heart disease of native coronary artery without angina pectoris: Secondary | ICD-10-CM | POA: Insufficient documentation

## 2011-10-30 DIAGNOSIS — I509 Heart failure, unspecified: Secondary | ICD-10-CM | POA: Insufficient documentation

## 2011-10-30 DIAGNOSIS — G4733 Obstructive sleep apnea (adult) (pediatric): Secondary | ICD-10-CM | POA: Insufficient documentation

## 2011-10-30 DIAGNOSIS — E0590 Thyrotoxicosis, unspecified without thyrotoxic crisis or storm: Secondary | ICD-10-CM | POA: Insufficient documentation

## 2011-10-30 DIAGNOSIS — E785 Hyperlipidemia, unspecified: Secondary | ICD-10-CM | POA: Insufficient documentation

## 2011-10-30 DIAGNOSIS — I10 Essential (primary) hypertension: Secondary | ICD-10-CM | POA: Insufficient documentation

## 2011-10-30 DIAGNOSIS — Z79899 Other long term (current) drug therapy: Secondary | ICD-10-CM | POA: Insufficient documentation

## 2011-10-30 DIAGNOSIS — K219 Gastro-esophageal reflux disease without esophagitis: Secondary | ICD-10-CM | POA: Insufficient documentation

## 2011-10-30 DIAGNOSIS — E662 Morbid (severe) obesity with alveolar hypoventilation: Secondary | ICD-10-CM | POA: Insufficient documentation

## 2011-10-30 DIAGNOSIS — I5032 Chronic diastolic (congestive) heart failure: Secondary | ICD-10-CM | POA: Insufficient documentation

## 2011-10-30 DIAGNOSIS — Z5189 Encounter for other specified aftercare: Secondary | ICD-10-CM | POA: Insufficient documentation

## 2011-11-01 ENCOUNTER — Ambulatory Visit (HOSPITAL_COMMUNITY): Payer: Medicare Other

## 2011-11-01 ENCOUNTER — Encounter (HOSPITAL_COMMUNITY): Payer: Medicare Other

## 2011-11-06 ENCOUNTER — Ambulatory Visit (HOSPITAL_COMMUNITY): Payer: Medicare Other

## 2011-11-06 ENCOUNTER — Encounter (HOSPITAL_COMMUNITY)
Admission: RE | Admit: 2011-11-06 | Discharge: 2011-11-06 | Disposition: A | Discharge status: Home / Self Care | Payer: Medicare Other | Source: Ambulatory Visit | Attending: Cardiology | Admitting: Cardiology

## 2011-11-06 DIAGNOSIS — G4733 Obstructive sleep apnea (adult) (pediatric): Secondary | ICD-10-CM | POA: Insufficient documentation

## 2011-11-06 DIAGNOSIS — I359 Nonrheumatic aortic valve disorder, unspecified: Secondary | ICD-10-CM | POA: Insufficient documentation

## 2011-11-06 DIAGNOSIS — E0590 Thyrotoxicosis, unspecified without thyrotoxic crisis or storm: Secondary | ICD-10-CM | POA: Insufficient documentation

## 2011-11-06 DIAGNOSIS — J4489 Other specified chronic obstructive pulmonary disease: Secondary | ICD-10-CM | POA: Insufficient documentation

## 2011-11-06 DIAGNOSIS — Z79899 Other long term (current) drug therapy: Secondary | ICD-10-CM | POA: Insufficient documentation

## 2011-11-06 DIAGNOSIS — I10 Essential (primary) hypertension: Secondary | ICD-10-CM | POA: Insufficient documentation

## 2011-11-06 DIAGNOSIS — Z7982 Long term (current) use of aspirin: Secondary | ICD-10-CM | POA: Insufficient documentation

## 2011-11-06 DIAGNOSIS — J449 Chronic obstructive pulmonary disease, unspecified: Secondary | ICD-10-CM | POA: Insufficient documentation

## 2011-11-06 DIAGNOSIS — I509 Heart failure, unspecified: Secondary | ICD-10-CM | POA: Insufficient documentation

## 2011-11-06 DIAGNOSIS — Z5189 Encounter for other specified aftercare: Secondary | ICD-10-CM | POA: Insufficient documentation

## 2011-11-06 DIAGNOSIS — I2510 Atherosclerotic heart disease of native coronary artery without angina pectoris: Secondary | ICD-10-CM | POA: Insufficient documentation

## 2011-11-06 DIAGNOSIS — K219 Gastro-esophageal reflux disease without esophagitis: Secondary | ICD-10-CM | POA: Insufficient documentation

## 2011-11-06 DIAGNOSIS — E039 Hypothyroidism, unspecified: Secondary | ICD-10-CM | POA: Insufficient documentation

## 2011-11-06 DIAGNOSIS — E119 Type 2 diabetes mellitus without complications: Secondary | ICD-10-CM | POA: Insufficient documentation

## 2011-11-06 DIAGNOSIS — E662 Morbid (severe) obesity with alveolar hypoventilation: Secondary | ICD-10-CM | POA: Insufficient documentation

## 2011-11-06 DIAGNOSIS — I5032 Chronic diastolic (congestive) heart failure: Secondary | ICD-10-CM | POA: Insufficient documentation

## 2011-11-06 DIAGNOSIS — E785 Hyperlipidemia, unspecified: Secondary | ICD-10-CM | POA: Insufficient documentation

## 2011-11-08 ENCOUNTER — Encounter (HOSPITAL_COMMUNITY): Payer: Medicare Other

## 2011-11-08 ENCOUNTER — Ambulatory Visit (HOSPITAL_COMMUNITY): Payer: Medicare Other

## 2011-11-13 ENCOUNTER — Encounter (HOSPITAL_COMMUNITY): Payer: Medicare Other

## 2011-11-13 ENCOUNTER — Ambulatory Visit (HOSPITAL_COMMUNITY): Payer: Medicare Other

## 2011-11-15 ENCOUNTER — Encounter (HOSPITAL_COMMUNITY): Payer: Medicare Other

## 2011-11-15 ENCOUNTER — Ambulatory Visit (HOSPITAL_COMMUNITY): Payer: Medicare Other

## 2011-11-20 ENCOUNTER — Telehealth: Payer: Self-pay | Admitting: Internal Medicine

## 2011-11-20 ENCOUNTER — Ambulatory Visit (HOSPITAL_COMMUNITY): Payer: Medicare Other

## 2011-11-20 ENCOUNTER — Encounter (HOSPITAL_COMMUNITY): Payer: Medicare Other

## 2011-11-20 NOTE — Telephone Encounter (Signed)
Called, spoke with pt.  She would like sleep study from Oct 2012 to be faxed to [facility].  Advised this will be done and nothing further was needed.  Called [facility], spoke with Waynetta Sandy -- she would like this faxed to [phone].

## 2011-11-21 ENCOUNTER — Telehealth: Payer: Self-pay | Admitting: Pulmonary Disease

## 2011-11-21 NOTE — Telephone Encounter (Signed)
ATC no answer. 

## 2011-11-22 ENCOUNTER — Encounter (HOSPITAL_COMMUNITY): Payer: Medicare Other

## 2011-11-22 ENCOUNTER — Ambulatory Visit (HOSPITAL_COMMUNITY): Payer: Medicare Other

## 2011-11-22 NOTE — Telephone Encounter (Signed)
Per order from 07/27/2011-"Order faxed to [facility] to set up bipap 20/14 sm ff mask h/h biflex+3cm download in 1 month Humberto Seals".  ATC Beth, phone rang multiple times.

## 2011-11-22 NOTE — Telephone Encounter (Signed)
Spoke with [employer] with [facility]- she states that they received the last sleep study done on the pt, and is needing new order for BIPAP machine since the pt was non compliant with this in the past. Please advise Dr. Vassie Loll thanks

## 2011-11-22 NOTE — Telephone Encounter (Signed)
See my note - order was sent 10/12 Pl arrange FU visit with me

## 2011-11-23 ENCOUNTER — Telehealth: Payer: Self-pay | Admitting: Pulmonary Disease

## 2011-11-23 NOTE — Telephone Encounter (Signed)
Called [facility] - spoke with beth.  I informed her of below per Dr. Vassie Loll and read order to her.  She is requesting this order from Oct 2012 be faxed to her at [phone].  This was done.  Beth will call back if anything further is needed   I called pt's home # - [id number] to schedule f/u with Dr. Vassie Loll.

## 2011-11-23 NOTE — Telephone Encounter (Signed)
Spoke with [employer].  We faxed over the order that was originally sent on 07/26/12 for bipap but per [facility], this needs to be signed by the doctor.  It cannot be stamped with his signature.  Advised Dr. Vassie Loll is not back in the office until Monday but will have him sign at that time.  Once signed, will need to be faxed back to [phone].  Order placed in Dr. Reginia Naas look at -- will forward message to him so he is aware.

## 2011-11-26 NOTE — Telephone Encounter (Signed)
Pt return call °

## 2011-11-26 NOTE — Telephone Encounter (Signed)
Pt return call. Please CB at [phone] Chelsea [name]

## 2011-11-26 NOTE — Telephone Encounter (Signed)
LMTCBx2 to set f/u appt with Dr. Vassie Loll. Carron Curie, CMA

## 2011-11-26 NOTE — Telephone Encounter (Signed)
Spoke with pt and scheduled her ov with RA for 11/30/11 at 2:15 pm

## 2011-11-26 NOTE — Telephone Encounter (Signed)
signed

## 2011-11-27 ENCOUNTER — Encounter (HOSPITAL_COMMUNITY): Payer: Medicare Other

## 2011-11-27 ENCOUNTER — Ambulatory Visit (HOSPITAL_COMMUNITY): Payer: Medicare Other

## 2011-11-28 ENCOUNTER — Encounter (HOSPITAL_COMMUNITY): Payer: Self-pay | Admitting: *Deleted

## 2011-11-28 NOTE — Progress Notes (Signed)
Pulmonary Rehab  Attempted contact with patient 4 different times by phone to start the first day of exercise. Patient did complete the orientation but,  per our protocol patient needed closed toed shoes in order to participate in rehab program and was waiting on diabetic shoes to come for exercise. We still have not heard back from patient. Letter sent in the mail for final contact attempt.   Ruffin Frederick, MS, NASM, CES

## 2011-11-29 ENCOUNTER — Encounter (HOSPITAL_COMMUNITY): Payer: Medicare Other

## 2011-11-29 ENCOUNTER — Ambulatory Visit (HOSPITAL_COMMUNITY): Payer: Medicare Other

## 2011-11-30 ENCOUNTER — Encounter: Payer: Self-pay | Admitting: Pulmonary Disease

## 2011-11-30 ENCOUNTER — Other Ambulatory Visit (INDEPENDENT_AMBULATORY_CARE_PROVIDER_SITE_OTHER): Payer: Medicare Other

## 2011-11-30 ENCOUNTER — Ambulatory Visit (INDEPENDENT_AMBULATORY_CARE_PROVIDER_SITE_OTHER): Payer: Medicare Other | Admitting: Pulmonary Disease

## 2011-11-30 VITALS — BP 124/84 | HR 79 | Temp 98.5°F | Ht 67.0 in | Wt 369.0 lb

## 2011-11-30 DIAGNOSIS — I5033 Acute on chronic diastolic (congestive) heart failure: Secondary | ICD-10-CM

## 2011-11-30 DIAGNOSIS — G4733 Obstructive sleep apnea (adult) (pediatric): Secondary | ICD-10-CM

## 2011-11-30 LAB — BASIC METABOLIC PANEL
GFR: 81.41 mL/min (ref >60.00)
Glucose, Bld: 239 mg/dL — ABNORMAL HIGH (ref 70–99)
Potassium: 4.5 mEq/L (ref 3.5–5.1)
Sodium: 137 mEq/L (ref 135–145)

## 2011-11-30 LAB — MAGNESIUM: Magnesium: 2.1 mg/dL (ref 1.5–2.5)

## 2011-11-30 NOTE — Assessment & Plan Note (Addendum)
Aim for 350 lbs - Take lasix twice daily until you reach this weight, then go back down to once daily Bmet, mg nml Oxygen 2 L [place] at rest, 3L while walking Albuterol inhaler as needed only for wheezing

## 2011-11-30 NOTE — Progress Notes (Signed)
  Subjective:    Patient ID: Chelsea [name], female    DOB: 1966-02-02, 46 y.o.   MRN: [id number]  HPI PCP - Beverely Low / Clovis Riley - Rosalita Levan 45/F,never smoker, morbidly obese for FU of obstructive sleep apnea & diastolic heart failure .  On O2 since 2004 after cervical CA surgery, got ON CPAP around the same time - [facility] .  admitted to [facility] in [place] with pneumonia and respiratory failure. Required mech ventilation at [facility] , then again at [facility], She was treated with antibiotics and diuretics with improvement. She was weaned off BiPPA to O2 daytime & nocturnal CPAP. Echocardiogram in January of 2012 showed normal LV function, mild left atrial enlargement and mild aortic insufficiency.  Advair prior / spiriva started in [facility] - unclear if this is helping     PSG 3/12  >> Severe obstructive sleep apnea with hypopneas causing oxygen desaturation and sleep fragmentation. This was corrected by CPAP of 18 cm with a C-Flex of 3 cm with 2 L of oxygen blended in. Limb movements seemed to emerge and increase on CPAP  At the time of this study, she weighed 322 pounds with a height of 5 feet 7 inches, BMI of 50 and neck size of 18 inches.  Hospitalised 3/9-3/17/12 for acute renal failure (on toresemide & metalazone ) & hypercarbic resp failure    11/30/2011 -last visit 8/12 CPAP was taken away due to poor compliance, required rpt PSG 10/12>>Study clarified biPAP requirement . For some reason bipap was not set up until feb '13 bipap 20/14 sm ff mask h/h biflex+3cm - await 1 mnth download  -mask ok, pressur eok  Patient states wearing cpap every night 6 to 8 hours on average. States braething about the same. Denies chest pain, chest tightness, cough, and wheezing Gained from 349 8/12  to 369 lbs today - taking lasix once daily only  Compliant with O2  she continues to have chronic leg swelling  BMET & Mg nml  Review of Systems Patient denies significant dyspnea,cough,  hemoptysis,  chest pain, palpitations, pedal edema, orthopnea, paroxysmal nocturnal dyspnea, lightheadedness, nausea, vomiting, abdominal or  leg pains      Objective:   Physical Exam  Gen. Pleasant, obese, in no distress, normal affect ENT - no lesions, no post nasal drip, class 2-3 airway Neck: No JVD, no thyromegaly, no carotid bruits Lungs: no use of accessory muscles, no dullness to percussion, decreased without rales or rhonchi  Cardiovascular: Rhythm regular, heart sounds  normal, no murmurs or gallops, 2+ peripheral edema Abdomen: soft and non-tender, no hepatosplenomegaly, BS normal. Musculoskeletal: No deformities, no cyanosis or clubbing Neuro:  alert, non focal, no tremors        Assessment & Plan:

## 2011-11-30 NOTE — Patient Instructions (Signed)
Aim for 350 lbs - Take lasix twice daily until you reach this weight, then go back down to once daily Blood owrk today Oxygen 2 L [place] at rest, 3L while walking Albuterol inhaler as needed only for wheezing

## 2011-11-30 NOTE — Assessment & Plan Note (Signed)
Await bipap download on 20/14 Weight loss encouraged, compliance with goal of at least 4-6 hrs every night is the expectation. Advised against medications with sedative side effects Cautioned against driving when sleepy - understanding that sleepiness will vary on a day to day basis

## 2011-12-04 ENCOUNTER — Encounter (HOSPITAL_COMMUNITY): Payer: Medicare Other

## 2011-12-04 ENCOUNTER — Ambulatory Visit (HOSPITAL_COMMUNITY): Payer: Medicare Other

## 2011-12-06 ENCOUNTER — Encounter (HOSPITAL_COMMUNITY): Payer: Medicare Other

## 2011-12-06 ENCOUNTER — Ambulatory Visit (HOSPITAL_COMMUNITY): Payer: Medicare Other

## 2011-12-07 ENCOUNTER — Encounter: Payer: Medicare Other | Admitting: Cardiology

## 2011-12-07 NOTE — Progress Notes (Signed)
HPI: 46 y.o. female with diastolic CHF for fu. She was admitted to [facility] in January 2012 with pneumonia and respiratory failure. [facility]. She was admitted in March 2012 with acute respiratory insufficiency which was felt to be multifactorial and included obstructive sleep apnea and congestive heart failure. Also she had acute renal failure and required pressors at one point. Echocardiogram showed an ejection fraction of 60-65% and a mildly elevated LVOT gradient felt secondary to hyperdynamic function or mild AS. There was a mildly elevated gradient in her thoracic aorta. There was trace AI. She was admitted again from the office 5/12 with acute on chronic diastolic heart failure. Echocardiogram this time suggested mild to moderate aortic stenosis as well as increased gradients in her thoracic aorta. She had a TEE in July 2012. This demonstrated normal LVF, mild AS with a mean gradient of 16. There was no evidence of coarctation. Since she was last seen,   Current Outpatient Prescriptions  Medication Sig Dispense Refill  . albuterol (VENTOLIN HFA) 108 (90 BASE) MCG/ACT inhaler Inhale 2 puffs into the lungs every 6 (six) hours as needed for wheezing.  1 Inhaler  5  . aspirin 81 MG tablet Take 81 mg by mouth daily.        . celecoxib (CELEBREX) 200 MG capsule Take 200 mg by mouth daily. Or as directed      . famotidine (PEPCID) 20 MG tablet Take 20 mg by mouth 2 (two) times daily.        . ferrous sulfate 325 (65 FE) MG tablet Take 325 mg by mouth 2 (two) times daily. On Hold      . Fluticasone-Salmeterol (ADVAIR DISKUS) 500-50 MCG/DOSE AEPB Inhale 1 puff into the lungs every 12 (twelve) hours.        . furosemide (LASIX) 40 MG tablet Take 40 mg by mouth daily.       Marland Kitchen gabapentin (NEURONTIN) 600 MG tablet Take 600 mg by mouth daily as needed.        Marland Kitchen glucosamine-chondroitin 500-400 MG tablet Take 1 tablet  by mouth 2 (two) times daily.        . insulin detemir (LEVEMIR) 100 UNIT/ML injection Inject 65 Units into the skin at bedtime. As directed      . levothyroxine (SYNTHROID, LEVOTHROID) 150 MCG tablet TAKE 1 TABLET EVERY DAY  90 tablet  2  . Multiple Vitamin (MULTIVITAMIN) tablet Take 1 tablet by mouth daily.        . NON FORMULARY oxige 2 1/2 litt       . omeprazole (PRILOSEC) 20 MG capsule Take 1 capsule (20 mg total) by mouth daily.  30 capsule  6  . potassium chloride SA (K-DUR,KLOR-CON) 20 MEQ tablet Take 20 mEq by mouth daily.       . traMADol (ULTRAM) 50 MG tablet Take 1 tablet (50 mg total) by mouth every 6 (six) hours as needed for pain.  60 tablet  3     Past Medical History  Diagnosis Date  . GERD (gastroesophageal reflux disease)   . Hypothyroid   . Hypertension   . OSA (obstructive sleep apnea)   . Obesity hypoventilation syndrome   . Diastolic congestive heart failure     Echo 02/21/11: EF 55-60%, moderate LVH, grade 2 diastolic dysfunction, mild to moderate aortic stenosis with a mean gradient 23 mm of mercury  . Cervical cancer   . Diabetes mellitus   .  Hyperlipidemia   . Aortic stenosis     Echocardiogram 02/21/11:  Mean gradient 23 mm of mercury; peak gradient 36;; Turbulence noted in the area of the aortic isthmus with peak gradient 23 mmHg-consider MRI to assess for coarctation;    TEE: 04/02/11:  EF 55-60%, mod BAE, trileaflet AV with mild AS (pk and mean 25 and 16), mild AI, mild MR, mild TR, atrial septal aneurysm, no evidence of corarctation  . COPD (chronic obstructive pulmonary disease)   . CHF (congestive heart failure)   . Coronary artery disease     Past Surgical History  Procedure Date  . Cholecystectomy   . Tonsillectomy   . Abdominal hysterectomy     History   Social History  . Marital Status: Single    Spouse Name: N/A    Number of Children: N/A  . Years of Education: N/A   Occupational History  . disabled    Social History Main Topics  .  Smoking status: Never Smoker   . Smokeless tobacco: Never Used  . Alcohol Use: 0.0 oz/week     Rarely  . Drug Use: No  . Sexually Active: Not on file   Other Topics Concern  . Not on file   Social History Narrative  . No narrative on file    ROS: no fevers or chills, productive cough, hemoptysis, dysphasia, odynophagia, melena, hematochezia, dysuria, hematuria, rash, seizure activity, orthopnea, PND, pedal edema, claudication. Remaining systems are negative.  Physical Exam: Well-developed well-nourished in no acute distress.  Skin is warm and dry.  HEENT is normal.  Neck is supple. No thyromegaly.  Chest is clear to auscultation with normal expansion.  Cardiovascular exam is regular rate and rhythm.  Abdominal exam nontender or distended. No masses palpated. Extremities show no edema. neuro grossly intact  ECG     This encounter was created in error - please disregard.

## 2011-12-11 ENCOUNTER — Ambulatory Visit (HOSPITAL_COMMUNITY): Payer: Medicare Other

## 2011-12-11 ENCOUNTER — Encounter (HOSPITAL_COMMUNITY): Payer: Medicare Other

## 2011-12-13 ENCOUNTER — Encounter (HOSPITAL_COMMUNITY): Payer: Medicare Other

## 2011-12-13 ENCOUNTER — Ambulatory Visit (HOSPITAL_COMMUNITY): Payer: Medicare Other

## 2011-12-18 ENCOUNTER — Ambulatory Visit (HOSPITAL_COMMUNITY): Payer: Medicare Other

## 2011-12-18 ENCOUNTER — Encounter (HOSPITAL_COMMUNITY): Payer: Medicare Other

## 2011-12-20 ENCOUNTER — Encounter (HOSPITAL_COMMUNITY): Payer: Medicare Other

## 2011-12-20 ENCOUNTER — Ambulatory Visit (HOSPITAL_COMMUNITY): Payer: Medicare Other

## 2011-12-25 ENCOUNTER — Encounter (HOSPITAL_COMMUNITY): Payer: Medicare Other

## 2011-12-25 ENCOUNTER — Ambulatory Visit (HOSPITAL_COMMUNITY): Payer: Medicare Other

## 2011-12-27 ENCOUNTER — Encounter (HOSPITAL_COMMUNITY): Payer: Medicare Other

## 2011-12-27 ENCOUNTER — Ambulatory Visit (HOSPITAL_COMMUNITY): Payer: Medicare Other

## 2012-01-01 ENCOUNTER — Ambulatory Visit (HOSPITAL_COMMUNITY): Payer: Medicare Other

## 2012-01-01 ENCOUNTER — Encounter (HOSPITAL_COMMUNITY): Payer: Medicare Other

## 2012-01-03 ENCOUNTER — Encounter (HOSPITAL_COMMUNITY): Payer: Medicare Other

## 2012-01-03 ENCOUNTER — Ambulatory Visit (HOSPITAL_COMMUNITY): Payer: Medicare Other

## 2012-01-08 ENCOUNTER — Encounter (HOSPITAL_COMMUNITY): Payer: Medicare Other

## 2012-01-08 ENCOUNTER — Ambulatory Visit (HOSPITAL_COMMUNITY): Payer: Medicare Other

## 2012-01-09 ENCOUNTER — Telehealth: Payer: Self-pay | Admitting: Family Medicine

## 2012-01-09 NOTE — Telephone Encounter (Signed)
Refill: Omeprazole dr 20 mg capsule. Take 1 capsule daily. Qty 30. Last fill 11-24-11

## 2012-01-10 ENCOUNTER — Ambulatory Visit (HOSPITAL_COMMUNITY): Payer: Medicare Other

## 2012-01-10 ENCOUNTER — Encounter (HOSPITAL_COMMUNITY): Payer: Medicare Other

## 2012-01-10 NOTE — Telephone Encounter (Signed)
Pt no longer at the practice, called pharmacy to advise, spoke to Selena Batten at pharmacy to advise, she noted the RX was supposed to be sent to current PCP Clovis Riley

## 2012-01-15 ENCOUNTER — Encounter (HOSPITAL_COMMUNITY): Payer: Medicare Other

## 2012-01-15 ENCOUNTER — Ambulatory Visit (HOSPITAL_COMMUNITY): Payer: Medicare Other

## 2012-01-17 ENCOUNTER — Encounter (HOSPITAL_COMMUNITY): Payer: Medicare Other

## 2012-01-17 ENCOUNTER — Ambulatory Visit (HOSPITAL_COMMUNITY): Payer: Medicare Other

## 2012-01-22 ENCOUNTER — Ambulatory Visit (HOSPITAL_COMMUNITY): Payer: Medicare Other

## 2012-01-22 ENCOUNTER — Encounter (HOSPITAL_COMMUNITY): Payer: Medicare Other

## 2012-01-24 ENCOUNTER — Ambulatory Visit (HOSPITAL_COMMUNITY): Payer: Medicare Other

## 2012-01-24 ENCOUNTER — Encounter (HOSPITAL_COMMUNITY): Payer: Medicare Other

## 2012-01-29 ENCOUNTER — Encounter (HOSPITAL_COMMUNITY): Payer: Medicare Other

## 2012-01-29 ENCOUNTER — Ambulatory Visit (HOSPITAL_COMMUNITY): Payer: Medicare Other

## 2012-01-31 ENCOUNTER — Ambulatory Visit (HOSPITAL_COMMUNITY): Payer: Medicare Other

## 2012-01-31 ENCOUNTER — Encounter (HOSPITAL_COMMUNITY): Payer: Medicare Other

## 2012-02-05 ENCOUNTER — Encounter (HOSPITAL_COMMUNITY): Payer: Medicare Other

## 2012-02-05 ENCOUNTER — Ambulatory Visit (HOSPITAL_COMMUNITY): Payer: Medicare Other

## 2012-02-07 ENCOUNTER — Ambulatory Visit (HOSPITAL_COMMUNITY): Payer: Medicare Other

## 2012-02-07 ENCOUNTER — Encounter (HOSPITAL_COMMUNITY): Payer: Medicare Other

## 2012-02-12 ENCOUNTER — Ambulatory Visit (HOSPITAL_COMMUNITY): Payer: Medicare Other

## 2012-02-12 ENCOUNTER — Encounter (HOSPITAL_COMMUNITY): Payer: Medicare Other

## 2012-02-14 ENCOUNTER — Encounter (HOSPITAL_COMMUNITY): Payer: Medicare Other

## 2012-02-29 ENCOUNTER — Telehealth: Payer: Self-pay | Admitting: Family Medicine

## 2012-02-29 NOTE — Telephone Encounter (Signed)
Refill: Levothyroxine 150 mcg tablet. Take 1 tablet every day. Qty 90. Last fill 10-26-11

## 2012-02-29 NOTE — Telephone Encounter (Signed)
Pt has been discharged from practice, no refills from this office, per MD Beverely Low

## 2012-03-20 IMAGING — CR DG CHEST 1V PORT
1 series · 1 of 1 positions shown · non-contrast
Comparison: the previous day's study

CLINICAL DATA: Fever

PORTABLE CHEST - 1 VIEW

[view not recorded]
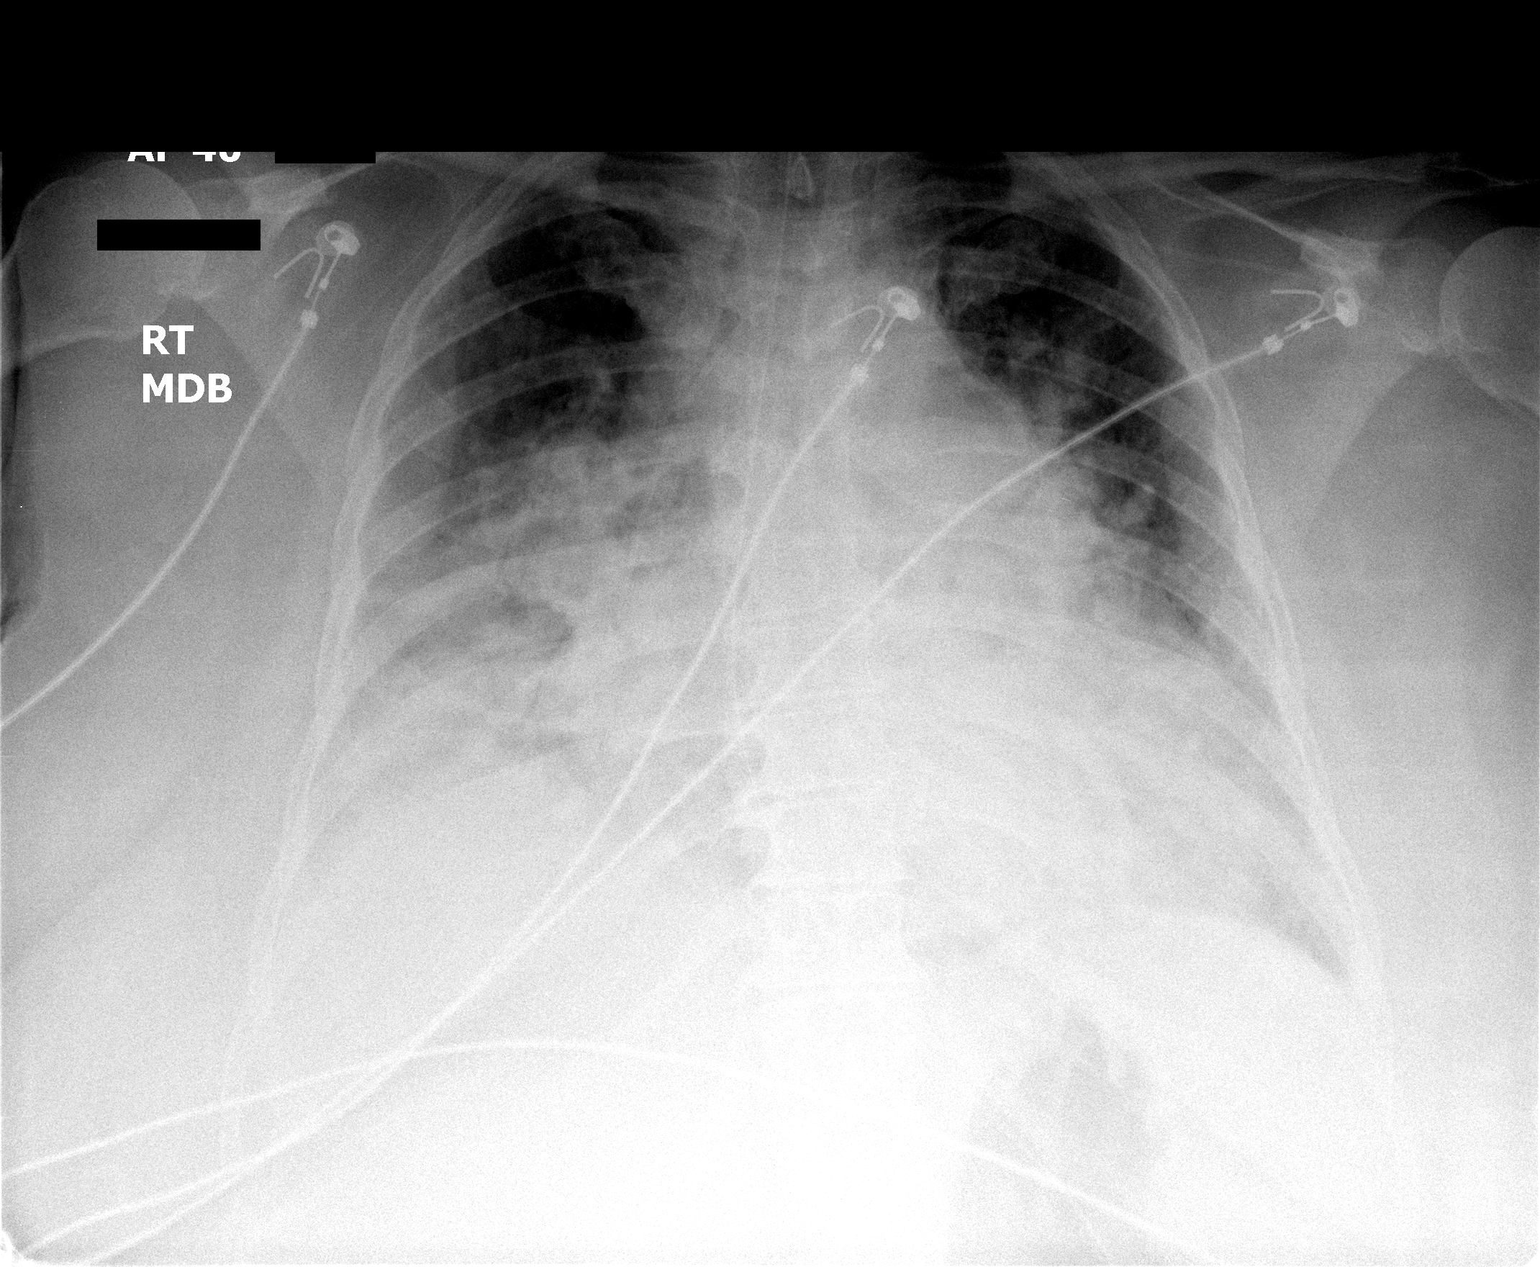

[1 of 1 positions shown; findings below may reference images not displayed]

FINDINGS: Nasogastric tube and left arm PICC are stable in
position.  Moderate patchy airspace opacities throughout both lungs
with a perihilar and bibasilar predominance are largely stable
compared to the previous exam.  No definite effusion.  Heart size
stable.
IMPRESSION: 1.  Little change in bilateral airspace disease.
2. Support hardware stable in position.

## 2012-05-02 ENCOUNTER — Ambulatory Visit: Payer: Medicare Other | Admitting: Adult Health

## 2012-05-05 ENCOUNTER — Ambulatory Visit: Payer: Medicare Other | Admitting: Adult Health

## 2012-05-20 IMAGING — US US RENAL PORT
1 series · 14 of 23 positions shown · non-contrast
Comparison: None.

CLINICAL DATA: Increasing creatinine.  Question obstruction.

RENAL/URINARY TRACT ULTRASOUND COMPLETE

[Series 1: us renal port · 0.47mm/px · 14 of 23 slices shown]
[im 1/23]
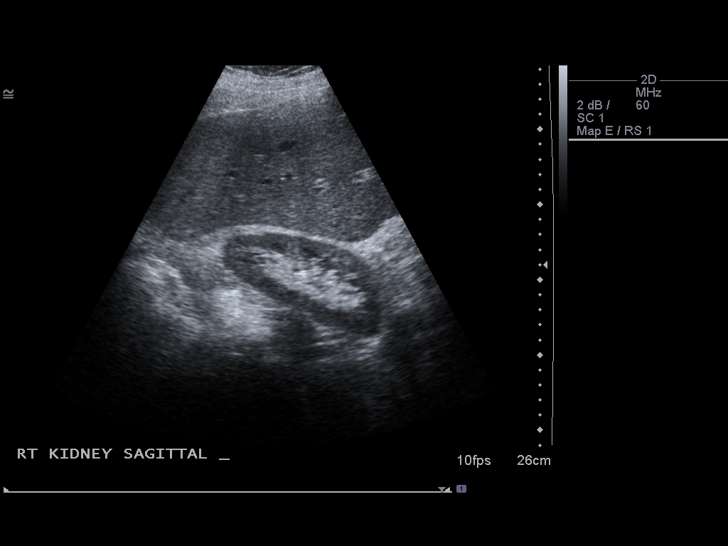
[im 3/23]
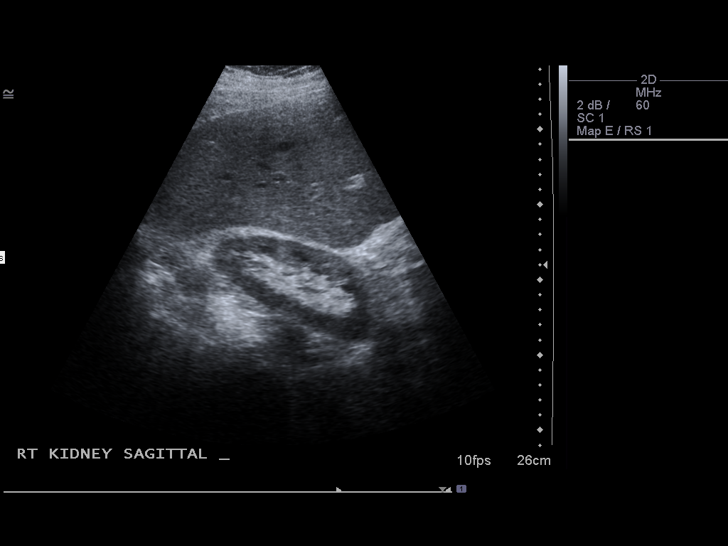
[im 5/23]
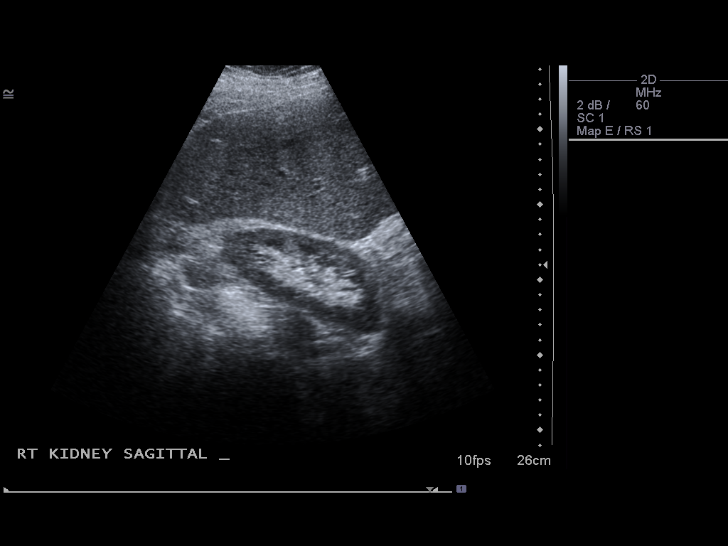
[im 6/23]
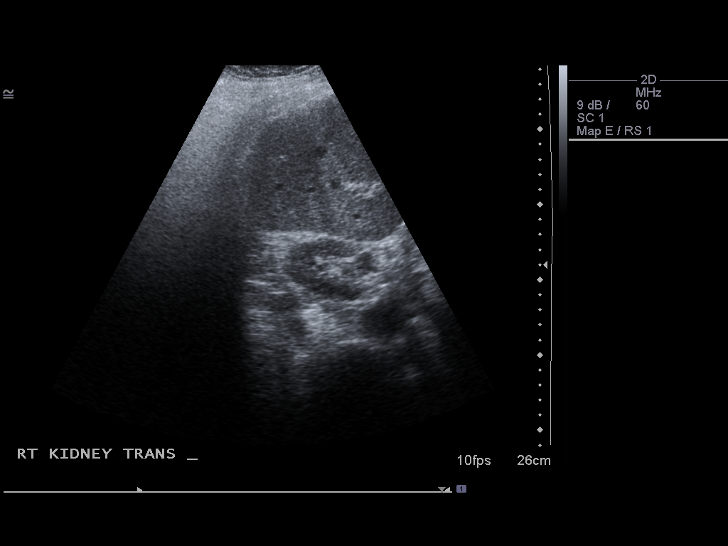
[im 8/23]
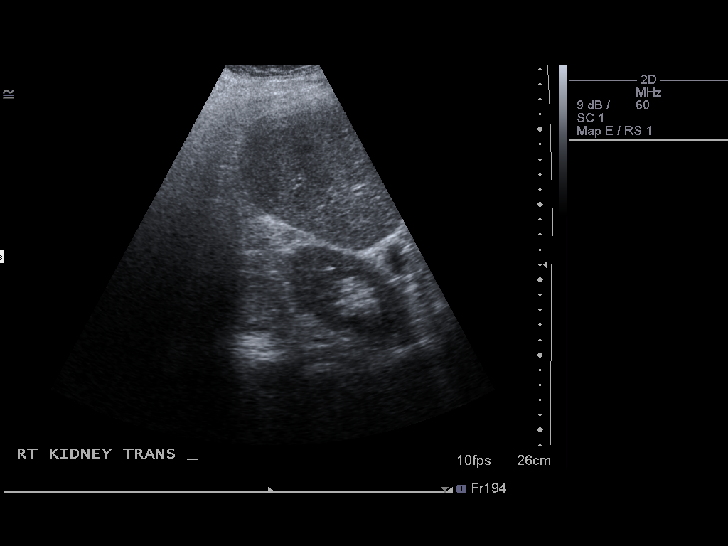
[im 10/23]
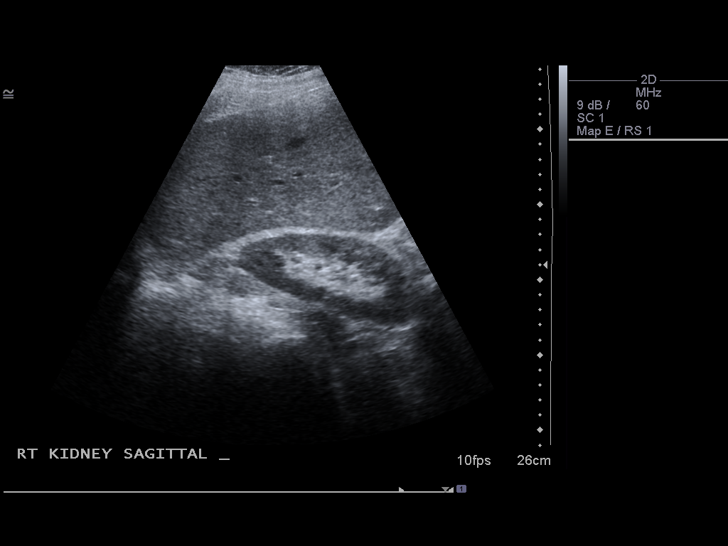
[im 11/23]
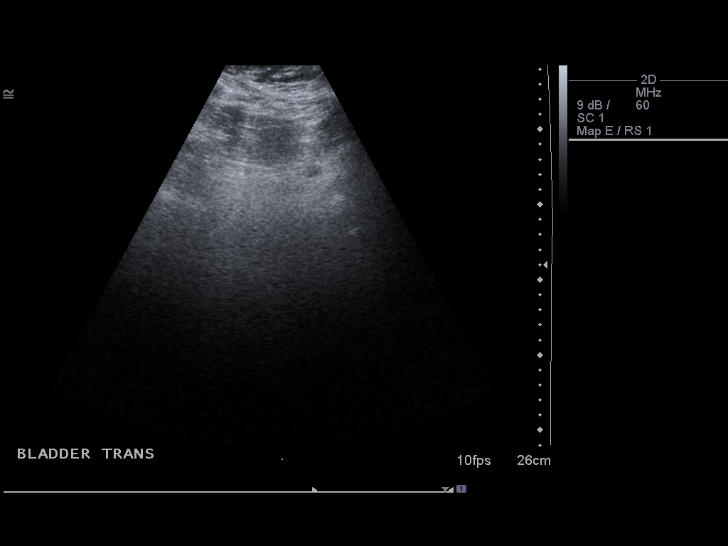
[im 13/23]
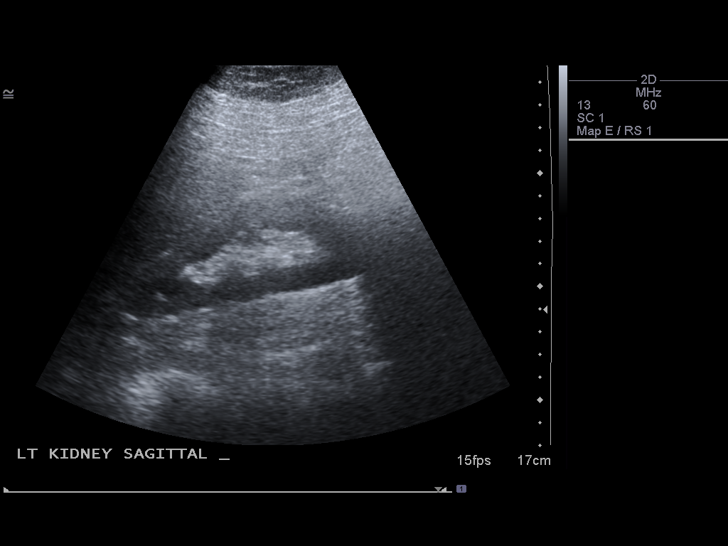
[im 14/23]
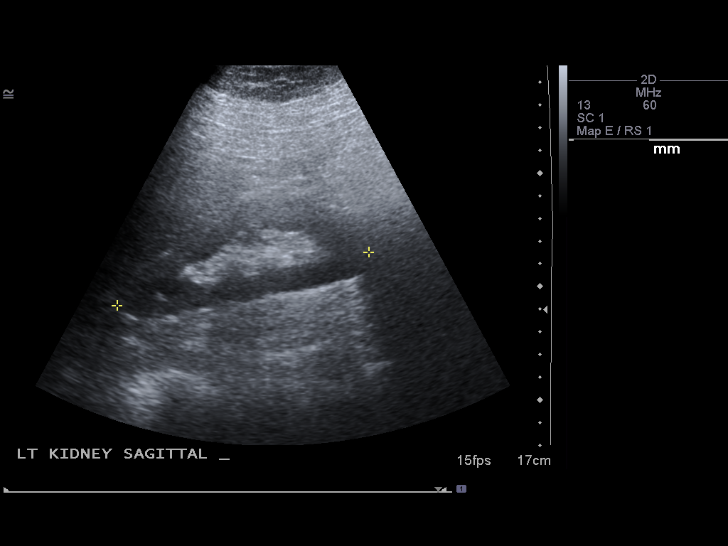
[im 16/23]
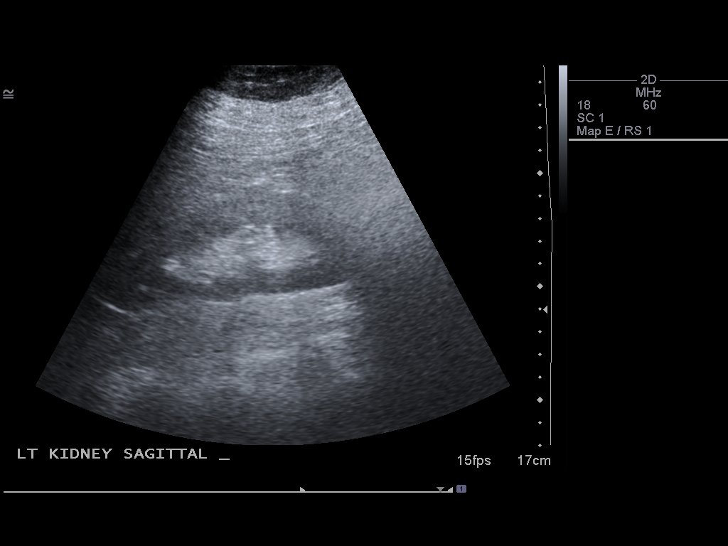
[im 18/23]
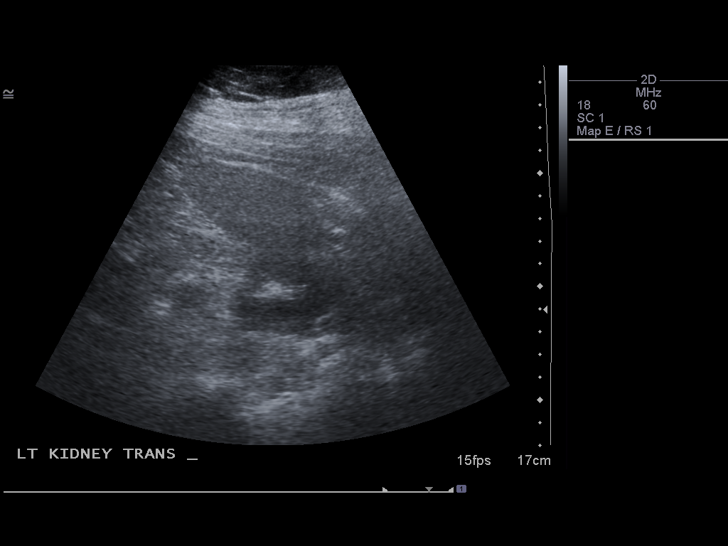
[im 19/23]
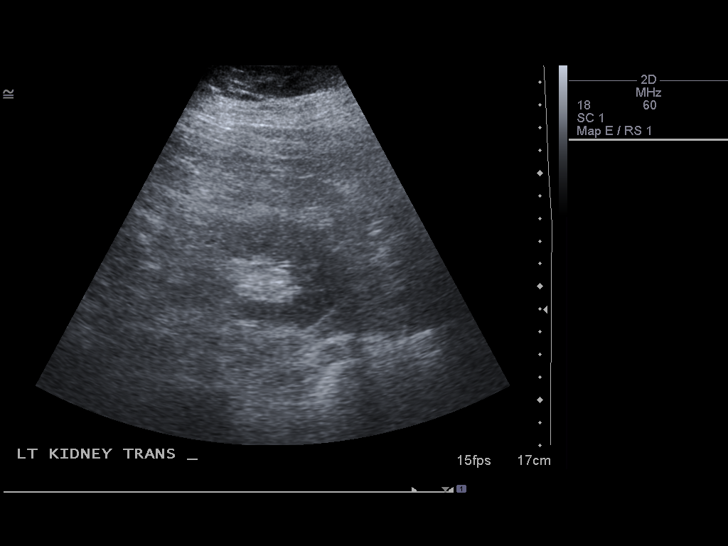
[im 21/23]
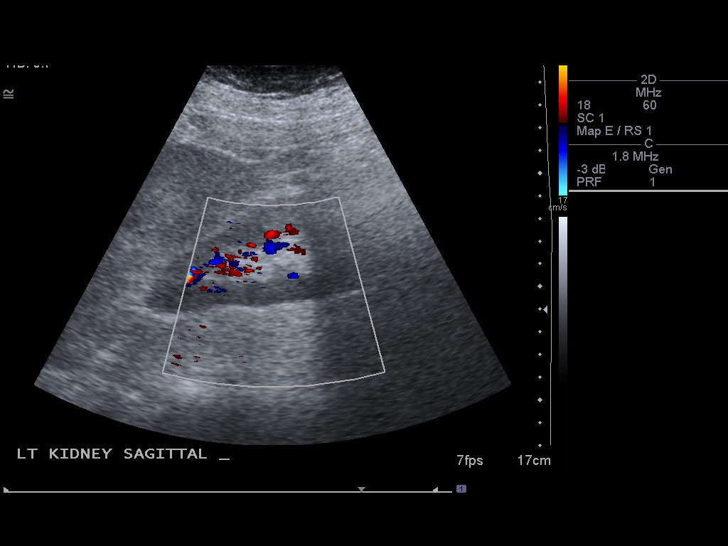
[im 23/23]
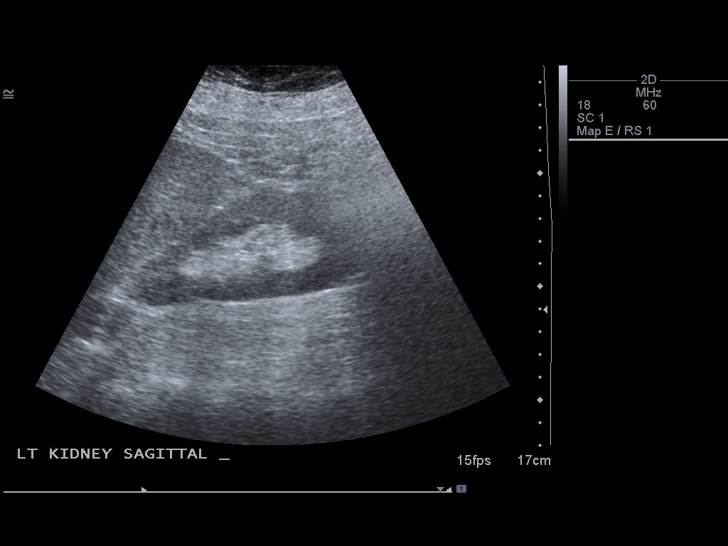

[14 of 23 positions shown; findings below may reference images not displayed]

FINDINGS: Right Kidney:  Measures 12.6 cm.  No stone, mass or hydronephrosis.

Left Kidney:  Measures 11.4 cm.  No stone, mass or hydronephrosis.

Bladder:  Decompressed with a Foley catheter in place.
IMPRESSION: Negative hydronephrosis.  Normal appearing kidneys.

## 2012-05-20 IMAGING — CT CT HEAD W/O CM
1 series · 16 of 30 positions shown, 20 images · non-contrast
Comparison: None

CLINICAL DATA: Confusion.

CT HEAD WITHOUT CONTRAST
TECHNIQUE: Contiguous axial images were obtained from the base of
the skull through the vertex without contrast.

[Series 2: head routine 4.8 h37s · axial · 0.45mm/px · z∈[+1354,+1507]mm · 16 of 36 slices shown, 20 images]
[im 2/36  brain]
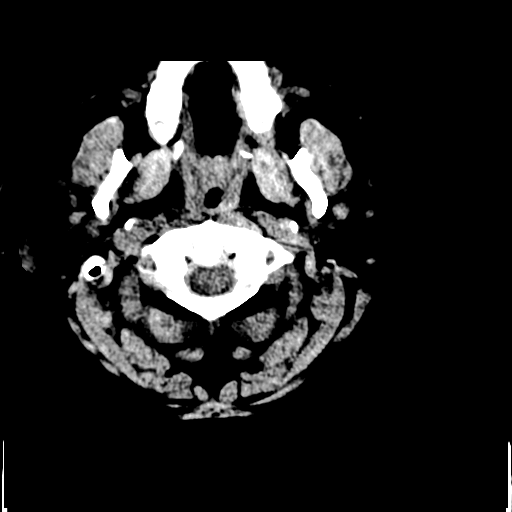
[im 2/36  bone]
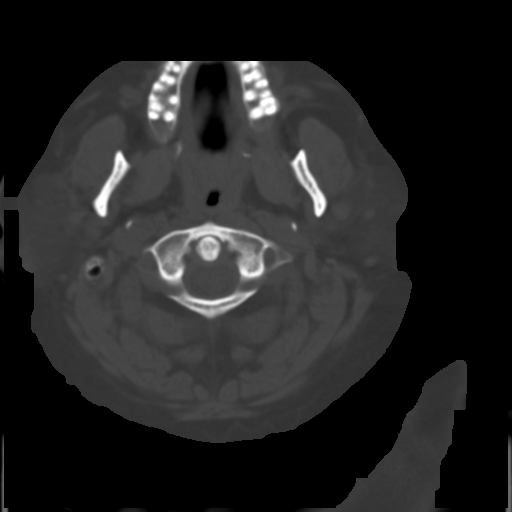
[im 4/36  brain]
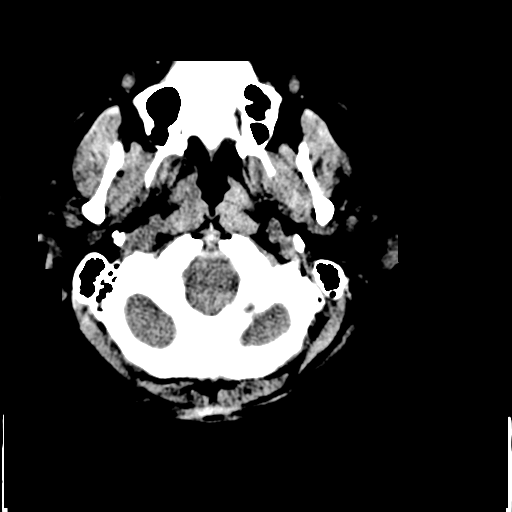
[im 7/36  brain]
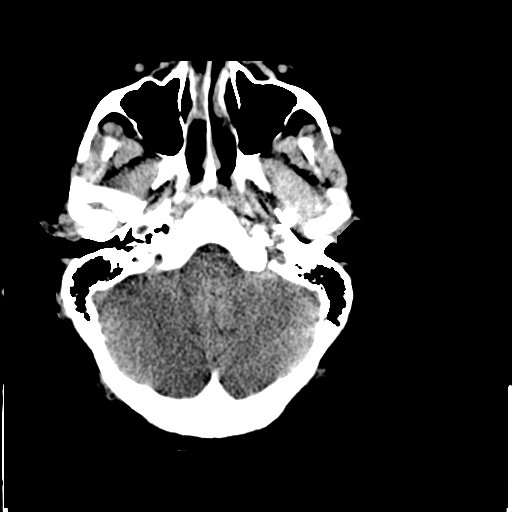
[im 9/36  brain]
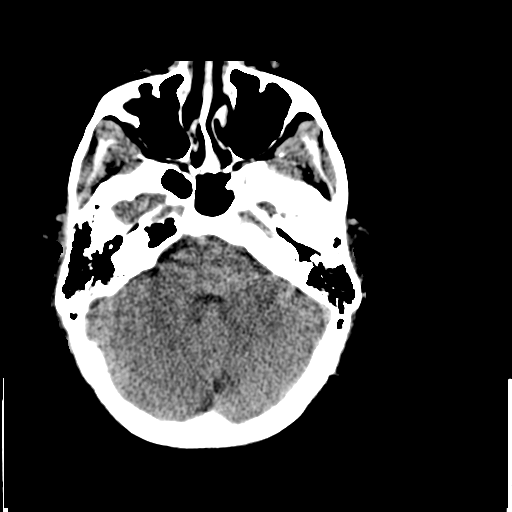
[im 10/36  brain]
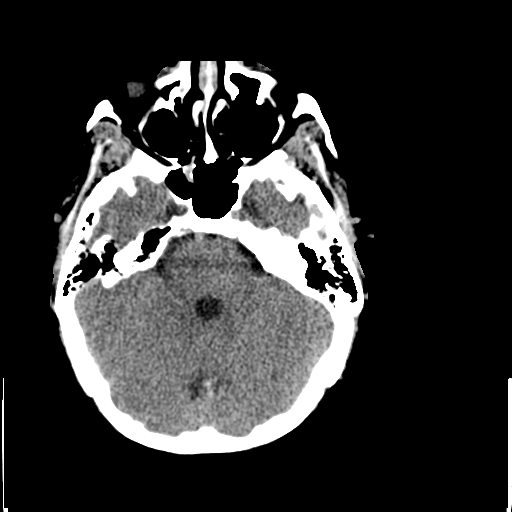
[im 10/36  bone]
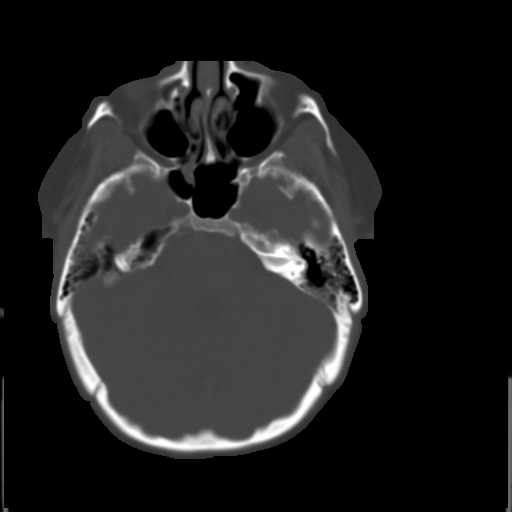
[im 13/36  brain]
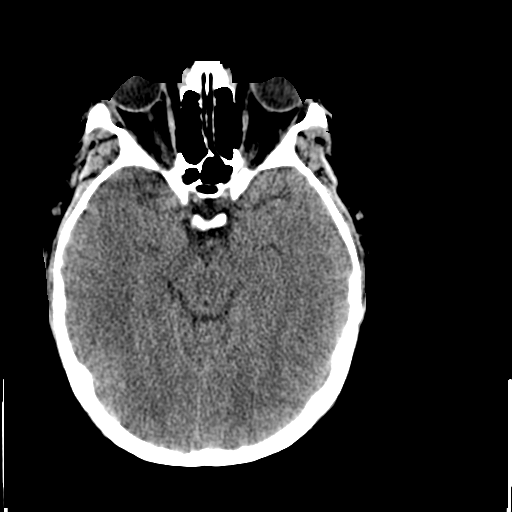
[im 15/36  brain]
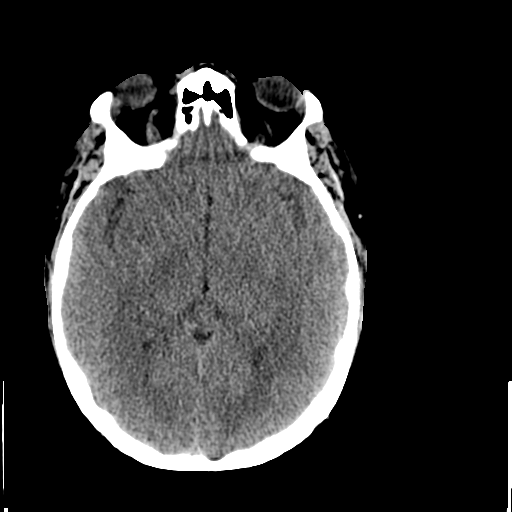
[im 17/36  brain]
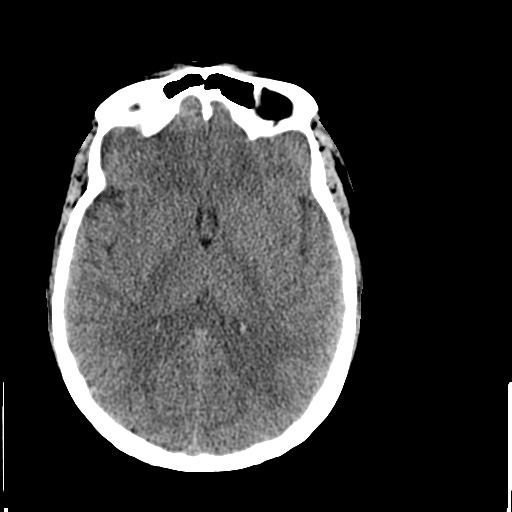
[im 19/36  brain]
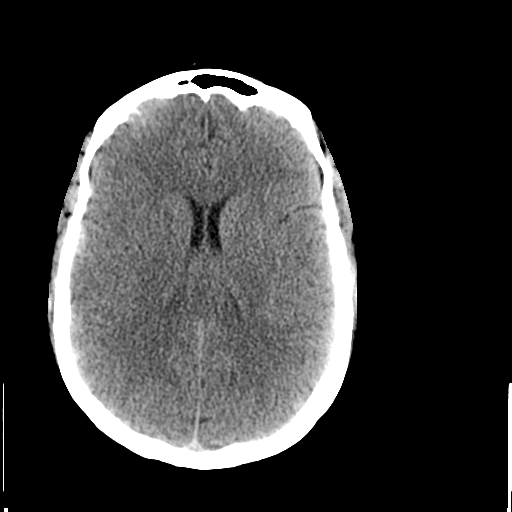
[im 19/36  bone]
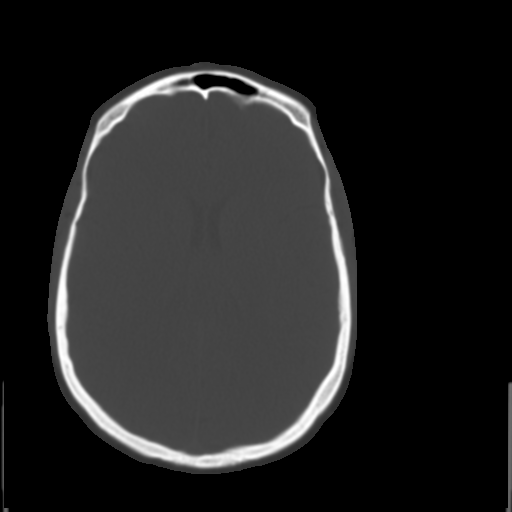
[im 21/36  brain]
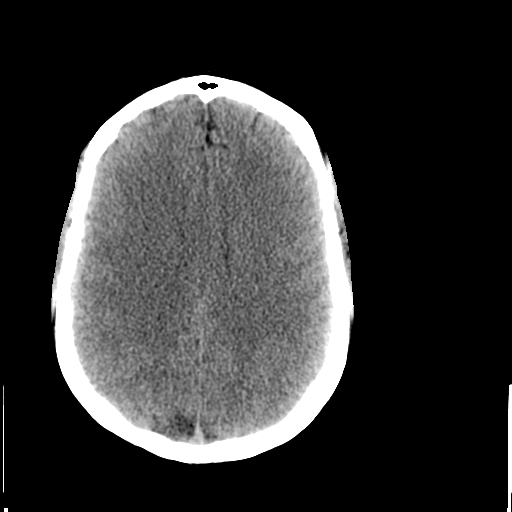
[im 23/36  brain]
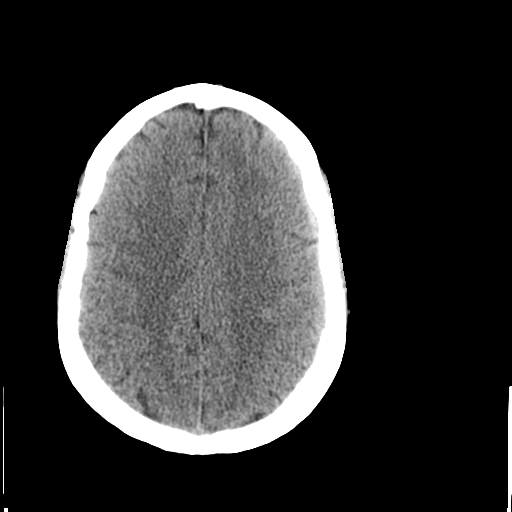
[im 26/36  brain]
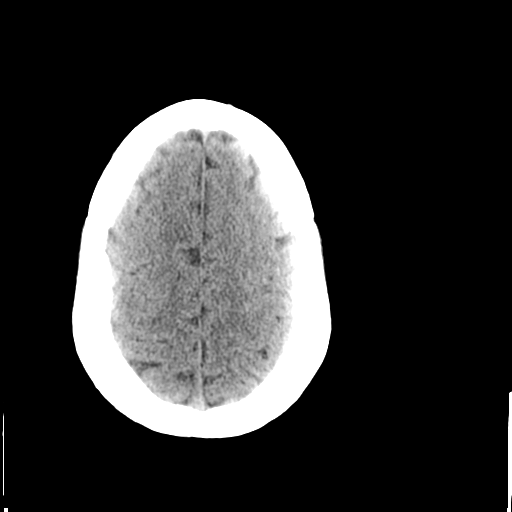
[im 27/36  brain]
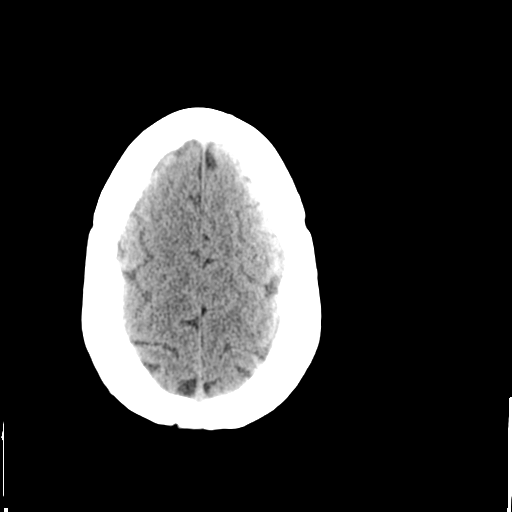
[im 27/36  bone]
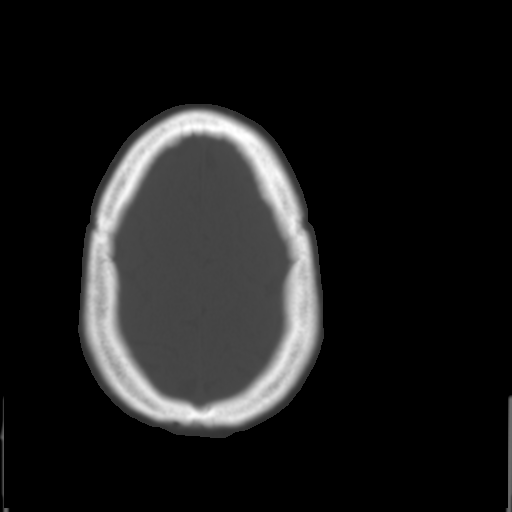
[im 29/36  brain]
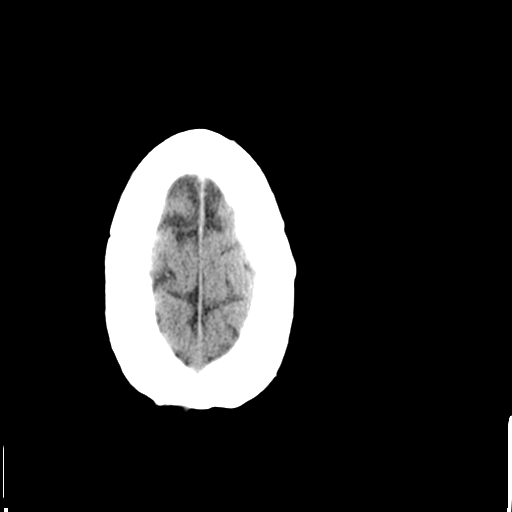
[im 32/36  brain]
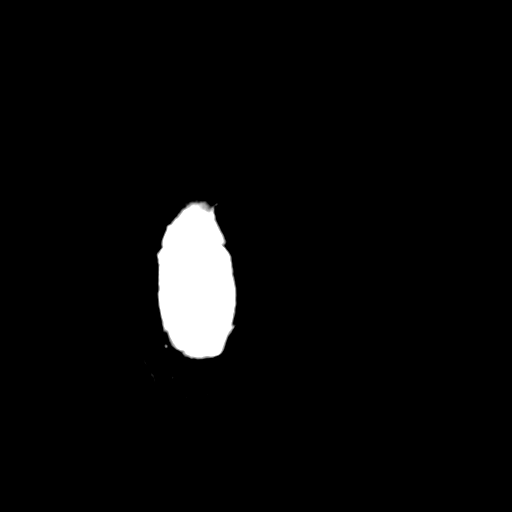
[im 34/36  brain]
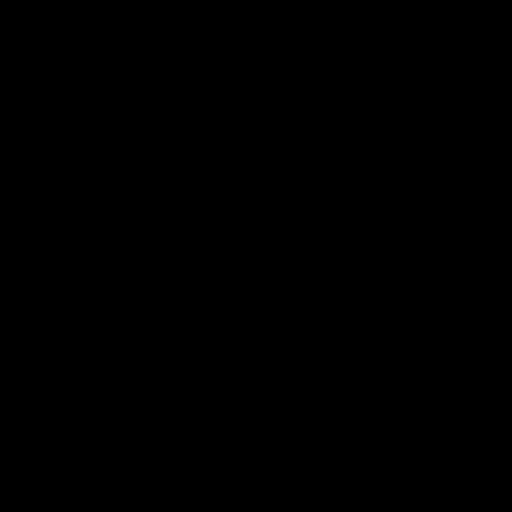

[16 of 30 positions shown; findings below may reference images not displayed]

FINDINGS: No acute intracranial abnormality.  Specifically, no
hemorrhage, hydrocephalus, mass lesion, acute infarction, or
significant intracranial injury.  No acute calvarial abnormality.
Visualized paranasal sinuses and mastoids clear.  Orbital soft
tissues unremarkable.
IMPRESSION: Normal study.

## 2012-05-23 IMAGING — CR DG CHEST 1V PORT
1 series · 1 of 1 positions shown · non-contrast
Comparison: 12/09/2010 and earlier.

CLINICAL DATA: 44-year-old female with possible pneumonia.

PORTABLE CHEST - 1 VIEW

[view not recorded]
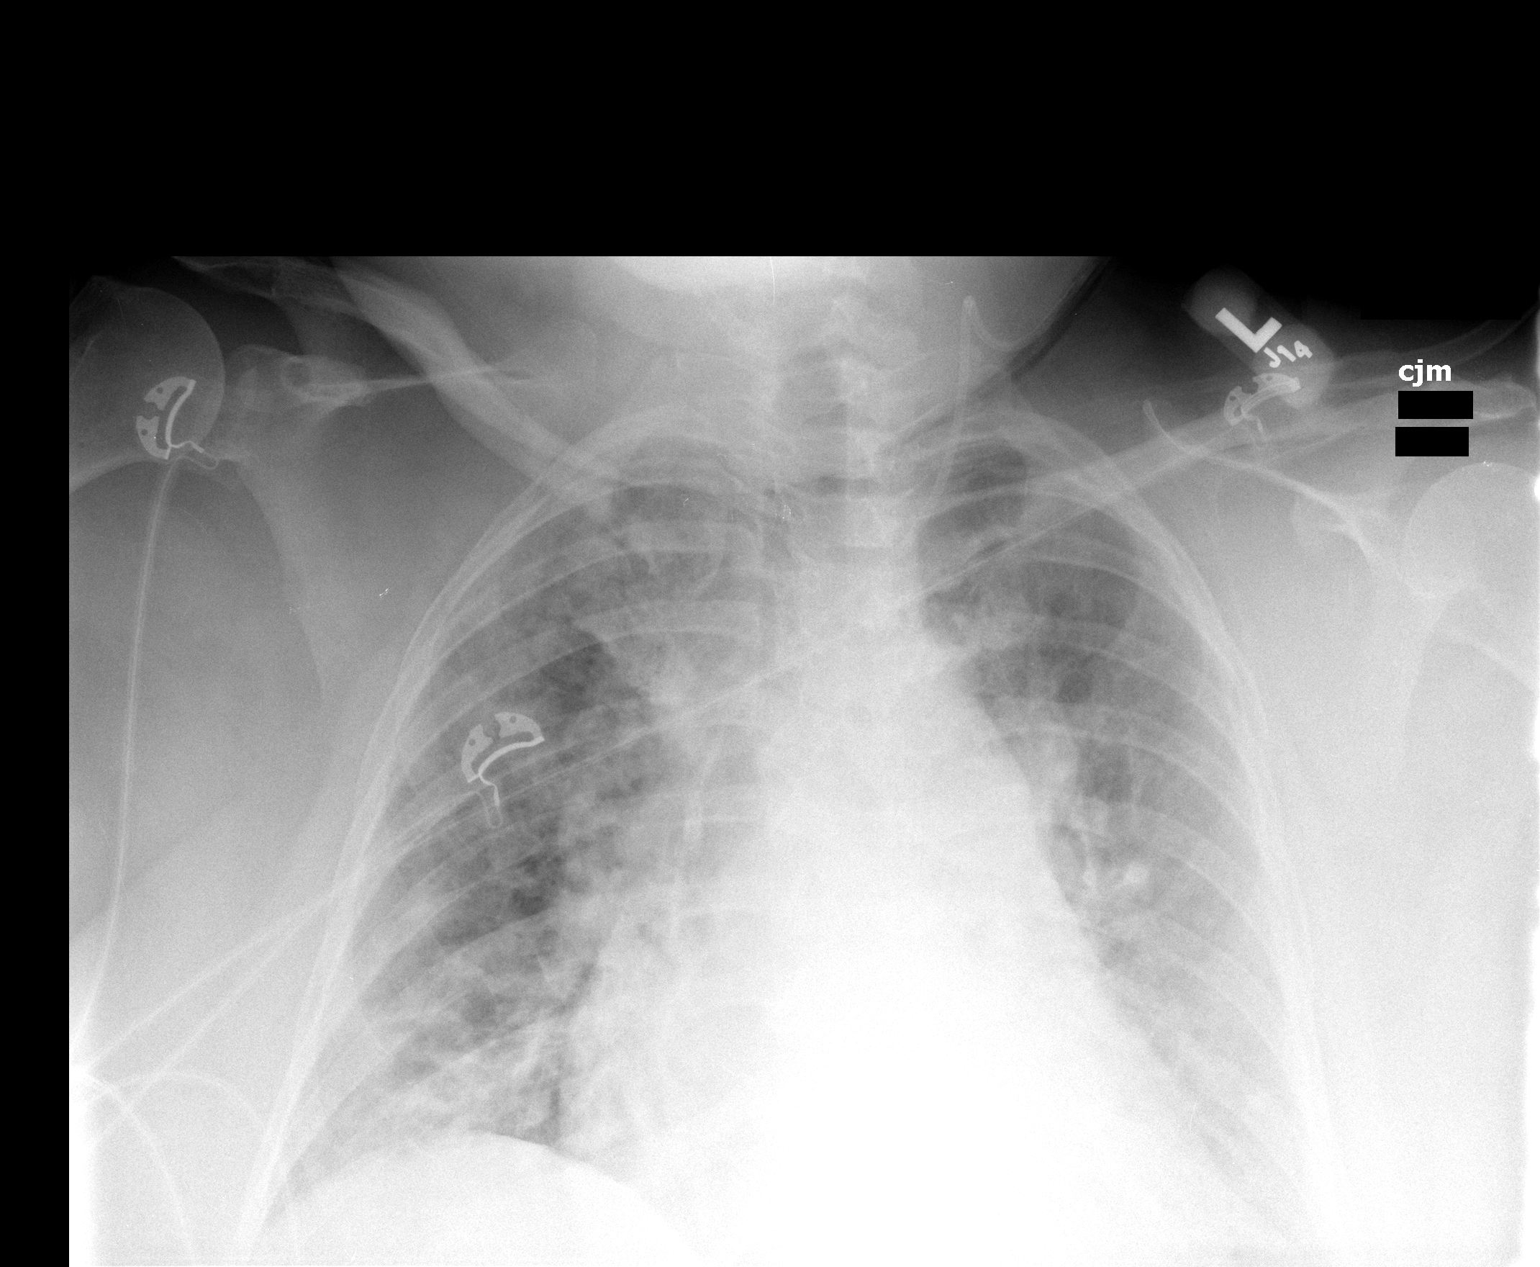

[1 of 1 positions shown; findings below may reference images not displayed]

FINDINGS: Portable semi upright AP view 6917 hours.  Interval
worsening of ventilation bilaterally, with patchy right basilar
opacity looking most increased. Stable cardiomegaly and mediastinal
contours.  Stable left IJ central line.  No pneumothorax or
definite effusion.
IMPRESSION: Interval worsening ventilation at the right lung base.  Findings
could reflect continued pulmonary edema with increased atelectasis,
or less likely [place] to focal pneumonia.

## 2012-06-17 ENCOUNTER — Other Ambulatory Visit: Payer: Self-pay

## 2012-06-17 DIAGNOSIS — I83893 Varicose veins of bilateral lower extremities with other complications: Secondary | ICD-10-CM

## 2012-06-17 DIAGNOSIS — M7989 Other specified soft tissue disorders: Secondary | ICD-10-CM

## 2012-07-11 ENCOUNTER — Encounter: Payer: Self-pay | Admitting: Surgery

## 2012-07-14 ENCOUNTER — Encounter: Payer: Medicare Other | Admitting: Surgery

## 2012-08-08 ENCOUNTER — Encounter: Payer: Self-pay | Admitting: Surgery

## 2012-08-11 ENCOUNTER — Encounter: Payer: Medicare Other | Admitting: Surgery

## 2012-08-15 ENCOUNTER — Ambulatory Visit (INDEPENDENT_AMBULATORY_CARE_PROVIDER_SITE_OTHER): Payer: Medicare Other | Admitting: Pulmonary Disease

## 2012-08-15 ENCOUNTER — Other Ambulatory Visit (INDEPENDENT_AMBULATORY_CARE_PROVIDER_SITE_OTHER): Payer: Medicare Other

## 2012-08-15 ENCOUNTER — Encounter: Payer: Self-pay | Admitting: Pulmonary Disease

## 2012-08-15 VITALS — BP 122/80 | HR 89 | Temp 97.7°F | Ht 65.0 in | Wt 381.2 lb

## 2012-08-15 DIAGNOSIS — Z23 Encounter for immunization: Secondary | ICD-10-CM

## 2012-08-15 DIAGNOSIS — I5032 Chronic diastolic (congestive) heart failure: Secondary | ICD-10-CM

## 2012-08-15 DIAGNOSIS — G4733 Obstructive sleep apnea (adult) (pediatric): Secondary | ICD-10-CM

## 2012-08-15 LAB — CBC WITH DIFFERENTIAL/PLATELET
Basophils Absolute: 0 10*3/uL (ref 0.0–0.1)
Basophils Relative: 0.4 % (ref 0.0–3.0)
HCT: 39.5 % (ref 36.0–46.0)
Hemoglobin: 12.9 g/dL (ref 12.0–15.0)
Lymphocytes Relative: 26.5 % (ref 12.0–46.0)
Lymphs Abs: 2.7 10*3/uL (ref 0.7–4.0)
Monocytes Relative: 5.9 % (ref 3.0–12.0)
Neutro Abs: 6.5 10*3/uL (ref 1.4–7.7)
RBC: 4.23 Mil/uL (ref 3.87–5.11)
RDW: 13.3 % (ref 11.5–14.6)

## 2012-08-15 LAB — IBC PANEL: Saturation Ratios: 17.6 % — ABNORMAL LOW (ref 20.0–50.0)

## 2012-08-15 LAB — FERRITIN: Ferritin: 62.9 ng/mL (ref 10.0–291.0)

## 2012-08-15 LAB — BASIC METABOLIC PANEL
BUN: 14 mg/dL (ref 6–23)
CO2: 40 mEq/L — ABNORMAL HIGH (ref 19–32)
Calcium: 9.3 mg/dL (ref 8.4–10.5)
Chloride: 90 mEq/L — ABNORMAL LOW (ref 96–112)
Creatinine, Ser: 0.8 mg/dL (ref 0.4–1.2)
Glucose, Bld: 211 mg/dL — ABNORMAL HIGH (ref 70–99)

## 2012-08-15 NOTE — Progress Notes (Signed)
Subjective:    Patient ID: Chelsea [name], female    DOB: [birth year], 46 y.o.   MRN: [id number]  HPI PCP -  Pietro Cassis   46/F,never smoker, morbidly obese for FU of obstructive sleep apnea/ OHS  & diastolic heart failure .  On O2 since 2004 after cervical CA surgery, got ON CPAP around the same time [facility]  admitted to [facility] in [place] with pneumonia and respiratory failure requiring mech ventilation. Echocardiogram in January of 2012 showed normal LV function, mild left atrial enlargement and mild aortic insufficiency.  Advair prior / spiriva started in [facility] - unclear if this is helping  PSG 3/12 >> Severe obstructive sleep apnea with hypopneas causing oxygen desaturation and sleep fragmentation. This was corrected by CPAP of 18 cm with a C-Flex of 3 cm with 2 L of oxygen blended in. Limb movements seemed to emerge and increase on CPAP  At the time of this study, she weighed 322 pounds with a height of 5 feet 7 inches, BMI of 50 and neck size of 18 inches.  Hospitalised 3/9-3/17/12 for acute renal failure (on toresemide & metalazone ) & hypercarbic resp failure    08/15/2012 104m FU - has stopped seeing dr Beverely Low & with dr Clovis Riley  Pt feels like she is coming down with cold, has had some diff SOB x 1 week--feels heart is having some strange rhythms--swelling in legs and ankles, pain and discoloration in feetwearing 3L o2 at all times now --wearing CPAP approx. 4-6 per night having some diff w machine (still waking with decreased energy)  CPAP was taken away due to poor compliance, required rpt PSG 10/12>>Study clarified biPAP requirement .  bipap set up  feb '13  bipap 20/14 sm ff mask h/h biflex+3cm - -mask ok, pressur eok  Patient states wearing bipap every night 6 to 8 hours on average. Denies chest pain, chest tightness, cough, and wheezing - not turrned in card for download Gained from 349 8/12 to 381 lbs today - taking lasix once daily  only - otherwise 'in [place]  all the time' Compliant with O2  she continues to have chronic leg swelling   Past Medical History  Diagnosis Date  . GERD (gastroesophageal reflux disease)   . Hypothyroid   . Hypertension   . OSA (obstructive sleep apnea)   . Obesity hypoventilation syndrome   . Diastolic congestive heart failure     Echo 02/21/11: EF 55-60%, moderate LVH, grade 2 diastolic dysfunction, mild to moderate aortic stenosis with a mean gradient 23 mm of mercury  . Cervical cancer   . Diabetes mellitus   . Hyperlipidemia   . Aortic stenosis     Echocardiogram 02/21/11:  Mean gradient 23 mm of mercury; peak gradient 36;; Turbulence noted in the area of the aortic isthmus with peak gradient 23 mmHg-consider MRI to assess for coarctation;    TEE: 04/02/11:  EF 55-60%, mod BAE, trileaflet AV with mild AS (pk and mean 25 and 16), mild AI, mild MR, mild TR, atrial septal aneurysm, no evidence of corarctation  . COPD (chronic obstructive pulmonary disease)   . CHF (congestive heart failure)   . Coronary artery disease     Review of Systems neg for any significant sore throat, dysphagia, itching, sneezing, nasal congestion or excess/ purulent secretions, fever, chills, sweats, unintended wt loss, pleuritic or exertional cp, hempoptysis, orthopnea pnd or change in chronic leg swelling. Also denies presyncope, palpitations, heartburn, abdominal  pain, nausea, vomiting, diarrhea or change in bowel or urinary habits, dysuria,hematuria, rash, arthralgias, visual complaints, headache, numbness weakness or ataxia.     Objective:   Physical Exam  Gen. Pleasant, obese, in no distress, normal affect ENT - no lesions, no post nasal drip, class 2-3 airway Neck: No JVD, no thyromegaly, no carotid bruits Lungs: no use of accessory muscles, no dullness to percussion, decreased without rales or rhonchi  Cardiovascular: Rhythm regular, heart sounds  normal, no murmurs or gallops, 2+ peripheral  edema Abdomen: soft and non-tender, no hepatosplenomegaly, BS normal. Musculoskeletal: No deformities, no cyanosis or clubbing Neuro:  alert, non focal, no tremors        Assessment & Plan:

## 2012-08-15 NOTE — Patient Instructions (Signed)
Turn in the chip so we can look at download Increase lasix to 40 mg twice daily- can time this around your daily schedule - goal weight is 350 lbs Flu shot Blood work today for potassium & iron levels - report will be sent to dr Clovis Riley

## 2012-08-15 NOTE — Assessment & Plan Note (Signed)
Turn in the chip so we can look at download   Weight loss encouraged, compliance with goal of at least 4-6 hrs every night is the expectation. Advised against medications with sedative side effects Cautioned against driving when sleepy - understanding that sleepiness will vary on a day to day basis

## 2012-08-15 NOTE — Assessment & Plan Note (Signed)
Increase lasix to 40 mg twice daily- can time this around your daily schedule - goal weight is 350 lbs Alternative would be hospitalisation for diuresis, which she prefers to avoid Flu shot Blood work today for potassium & iron levels - report will be sent to dr Clovis Riley

## 2012-09-19 ENCOUNTER — Encounter: Payer: Medicare Other | Admitting: Cardiology

## 2012-09-19 NOTE — Progress Notes (Signed)
HPI: Pleasant female for fu of diastolic CHF. Echocardiogram in 2012 showed an ejection fraction of 60-65% and a mildly elevated LVOT gradient felt secondary to hyperdynamic function or mild AS. There was a mildly elevated gradient in her thoracic aorta. There was trace AI. She had the TEE on 04/02/11. This demonstrated normal LVF, mild AS with a mean gradient of 16. There was no evidence of coarctation. Since she was last seen,    Current Outpatient Prescriptions  Medication Sig Dispense Refill  . albuterol (VENTOLIN HFA) 108 (90 BASE) MCG/ACT inhaler Inhale 2 puffs into the lungs every 6 (six) hours as needed for wheezing.  1 Inhaler  5  . aspirin 81 MG tablet Take 81 mg by mouth daily.        . celecoxib (CELEBREX) 200 MG capsule Take 200 mg by mouth daily. Or as directed      . famotidine (PEPCID) 20 MG tablet Take 20 mg by mouth 2 (two) times daily.        . ferrous sulfate 325 (65 FE) MG tablet Take 325 mg by mouth 2 (two) times daily. On Hold      . Fluticasone-Salmeterol (ADVAIR DISKUS) 500-50 MCG/DOSE AEPB Inhale 1 puff into the lungs every 12 (twelve) hours.        . furosemide (LASIX) 40 MG tablet Take 40 mg by mouth daily.       Marland Kitchen gabapentin (NEURONTIN) 600 MG tablet Take 600 mg by mouth daily as needed.        Marland Kitchen glucosamine-chondroitin 500-400 MG tablet Take 1 tablet by mouth 2 (two) times daily.        . insulin detemir (LEVEMIR) 100 UNIT/ML injection Inject 65 Units into the skin at bedtime. As directed      . levothyroxine (SYNTHROID, LEVOTHROID) 150 MCG tablet TAKE 1 TABLET EVERY DAY  90 tablet  2  . Multiple Vitamin (MULTIVITAMIN) tablet Take 1 tablet by mouth daily.        . NON FORMULARY oxige 2 1/2 litt       . omeprazole (PRILOSEC) 20 MG capsule Take 1 capsule (20 mg total) by mouth daily.  30 capsule  6  . potassium chloride SA (K-DUR,KLOR-CON) 20 MEQ tablet Take 20 mEq by mouth daily.       . traMADol (ULTRAM) 50 MG tablet Take 50 mg by mouth every 6 (six) hours as  needed.         Past Medical History  Diagnosis Date  . GERD (gastroesophageal reflux disease)   . Hypothyroid   . Hypertension   . OSA (obstructive sleep apnea)   . Obesity hypoventilation syndrome   . Diastolic congestive heart failure     Echo 02/21/11: EF 55-60%, moderate LVH, grade 2 diastolic dysfunction, mild to moderate aortic stenosis with a mean gradient 23 mm of mercury  . Cervical cancer   . Diabetes mellitus   . Hyperlipidemia   . Aortic stenosis     Echocardiogram 02/21/11:  Mean gradient 23 mm of mercury; peak gradient 36;; Turbulence noted in the area of the aortic isthmus with peak gradient 23 mmHg-consider MRI to assess for coarctation;    TEE: 04/02/11:  EF 55-60%, mod BAE, trileaflet AV with mild AS (pk and mean 25 and 16), mild AI, mild MR, mild TR, atrial septal aneurysm, no evidence of corarctation  . COPD (chronic obstructive pulmonary disease)   . CHF (congestive heart failure)   . Coronary artery disease  Past Surgical History  Procedure Date  . Cholecystectomy   . Tonsillectomy   . Abdominal hysterectomy     History   Social History  . Marital Status: Single    Spouse Name: N/A    Number of Children: N/A  . Years of Education: N/A   Occupational History  . disabled    Social History Main Topics  . Smoking status: Never Smoker   . Smokeless tobacco: Never Used  . Alcohol Use: 0.0 oz/week     Comment: Rarely  . Drug Use: No  . Sexually Active: Not on file   Other Topics Concern  . Not on file   Social History Narrative  . No narrative on file    ROS: no fevers or chills, productive cough, hemoptysis, dysphasia, odynophagia, melena, hematochezia, dysuria, hematuria, rash, seizure activity, orthopnea, PND, pedal edema, claudication. Remaining systems are negative.  Physical Exam: Well-developed well-nourished in no acute distress.  Skin is warm and dry.  HEENT is normal.  Neck is supple.  Chest is clear to auscultation with normal  expansion.  Cardiovascular exam is regular rate and rhythm.  Abdominal exam nontender or distended. No masses palpated. Extremities show no edema. neuro grossly intact  ECG     This encounter was created in error - please disregard.

## 2012-10-02 ENCOUNTER — Telehealth: Payer: Self-pay | Admitting: Cardiology

## 2012-10-02 NOTE — Telephone Encounter (Signed)
Pt having swelling in legs, stomach, has gained several pounds over 1 -2 weeks, pls advise

## 2012-10-02 NOTE — Telephone Encounter (Signed)
Spoke with pt, explained to pt it has been over one year since we have seen her and we are unable to give medical advise. Referred pt to the urgent care for treatment. Next available follow up made for the pt. Pt agreed with this plan and voiced understanding.

## 2012-10-23 ENCOUNTER — Other Ambulatory Visit: Payer: Self-pay | Admitting: Physician Assistant

## 2013-02-04 ENCOUNTER — Telehealth: Payer: Self-pay | Admitting: Pulmonary Disease

## 2013-02-04 NOTE — Telephone Encounter (Signed)
Called pt x's 3 to make next ov per recall.  Pt never returned calls.  Mailed recall letter 02/04/13. Emily E McAlister °

## 2013-02-27 ENCOUNTER — Ambulatory Visit: Payer: Medicare Other | Admitting: Adult Health

## 2013-03-02 ENCOUNTER — Ambulatory Visit: Payer: Medicare Other | Admitting: Adult Health

## 2013-03-20 ENCOUNTER — Ambulatory Visit: Payer: Medicare Other | Admitting: Adult Health

## 2013-03-31 ENCOUNTER — Ambulatory Visit: Payer: Medicare Other | Admitting: Adult Health

## 2013-04-28 ENCOUNTER — Ambulatory Visit: Payer: Medicare Other | Admitting: Pulmonary Disease

## 2013-06-16 ENCOUNTER — Encounter: Payer: Medicare Other | Admitting: Cardiology

## 2013-06-16 NOTE — Progress Notes (Signed)
HPI: Chelsea [name] is a 47 y.o. female with diastolic CHF. She was admitted to [facility] in January 2012 with pneumonia and respiratory failure. She [facility]. She has had issues with not understanding her medications and taking varying amounts of diuretics in the past. She was admitted in March 2012 with acute respiratory insufficiency which was felt to be multifactorial and included obstructive sleep apnea and congestive heart failure. Also she had acute renal failure and required pressors at one point. Echocardiogram showed an ejection fraction of 60-65% and a mildly elevated LVOT gradient felt secondary to hyperdynamic function or mild AS. There was a mildly elevated gradient in her thoracic aorta. There was trace AI. TEE on 7/12 demonstrated normal LVF, mild AS with a mean gradient of 16. There was no evidence of coarctation. Patient has not been seen in this office since July 2012.    Current Outpatient Prescriptions  Medication Sig Dispense Refill  . albuterol (VENTOLIN HFA) 108 (90 BASE) MCG/ACT inhaler Inhale 2 puffs into the lungs every 6 (six) hours as needed for wheezing.  1 Inhaler  5  . aspirin 81 MG tablet Take 81 mg by mouth daily.        . celecoxib (CELEBREX) 200 MG capsule Take 200 mg by mouth daily. Or as directed      . famotidine (PEPCID) 20 MG tablet Take 20 mg by mouth 2 (two) times daily.        . ferrous sulfate 325 (65 FE) MG tablet Take 325 mg by mouth 2 (two) times daily. On Hold      . Fluticasone-Salmeterol (ADVAIR DISKUS) 500-50 MCG/DOSE AEPB Inhale 1 puff into the lungs every 12 (twelve) hours.        . furosemide (LASIX) 40 MG tablet Take 40 mg by mouth daily.       Marland Kitchen gabapentin (NEURONTIN) 600 MG tablet Take 600 mg by mouth daily as needed.        Marland Kitchen glucosamine-chondroitin 500-400 MG tablet Take 1 tablet by mouth 2 (two) times daily.        . insulin detemir (LEVEMIR) 100  UNIT/ML injection Inject 65 Units into the skin at bedtime. As directed      . KLOR-CON M20 20 MEQ tablet TAKE 2 TABLETS TWICE A DAY  120 tablet  0  . levothyroxine (SYNTHROID, LEVOTHROID) 150 MCG tablet TAKE 1 TABLET EVERY DAY  90 tablet  2  . Multiple Vitamin (MULTIVITAMIN) tablet Take 1 tablet by mouth daily.        . NON FORMULARY oxige 2 1/2 litt       . omeprazole (PRILOSEC) 20 MG capsule Take 1 capsule (20 mg total) by mouth daily.  30 capsule  6  . potassium chloride SA (K-DUR,KLOR-CON) 20 MEQ tablet Take 20 mEq by mouth daily.       . traMADol (ULTRAM) 50 MG tablet Take 50 mg by mouth every 6 (six) hours as needed.       No current facility-administered medications for this visit.     Past Medical History  Diagnosis Date  . GERD (gastroesophageal reflux disease)   . Hypothyroid   . Hypertension   . OSA (obstructive sleep apnea)   . Obesity hypoventilation syndrome   . Diastolic congestive heart failure     Echo 02/21/11: EF 55-60%, moderate LVH, grade 2 diastolic dysfunction, mild to moderate aortic stenosis with a mean gradient 23 mm  of mercury  . Cervical cancer   . Diabetes mellitus   . Hyperlipidemia   . Aortic stenosis     Echocardiogram 02/21/11:  Mean gradient 23 mm of mercury; peak gradient 36;; Turbulence noted in the area of the aortic isthmus with peak gradient 23 mmHg-consider MRI to assess for coarctation;    TEE: 04/02/11:  EF 55-60%, mod BAE, trileaflet AV with mild AS (pk and mean 25 and 16), mild AI, mild MR, mild TR, atrial septal aneurysm, no evidence of corarctation  . COPD (chronic obstructive pulmonary disease)   . CHF (congestive heart failure)   . Coronary artery disease     Past Surgical History  Procedure Laterality Date  . Cholecystectomy    . Tonsillectomy    . Abdominal hysterectomy      History   Social History  . Marital Status: Single    Spouse Name: N/A    Number of Children: N/A  . Years of Education: N/A   Occupational History  .  disabled    Social History Main Topics  . Smoking status: Never Smoker   . Smokeless tobacco: Never Used  . Alcohol Use: 0.0 oz/week     Comment: Rarely  . Drug Use: No  . Sexual Activity: Not on file   Other Topics Concern  . Not on file   Social History Narrative  . No narrative on file    ROS: no fevers or chills, productive cough, hemoptysis, dysphasia, odynophagia, melena, hematochezia, dysuria, hematuria, rash, seizure activity, orthopnea, PND, pedal edema, claudication. Remaining systems are negative.  Physical Exam: Well-developed well-nourished in no acute distress.  Skin is warm and dry.  HEENT is normal.  Neck is supple.  Chest is clear to auscultation with normal expansion.  Cardiovascular exam is regular rate and rhythm.  Abdominal exam nontender or distended. No masses palpated. Extremities show no edema. neuro grossly intact  ECG     This encounter was created in error - please disregard.

## 2013-06-25 ENCOUNTER — Encounter: Payer: Self-pay | Admitting: Cardiology

## 2013-07-14 ENCOUNTER — Encounter: Payer: Self-pay | Admitting: Pulmonary Disease

## 2013-07-14 ENCOUNTER — Ambulatory Visit (INDEPENDENT_AMBULATORY_CARE_PROVIDER_SITE_OTHER): Payer: Medicare Other | Admitting: Pulmonary Disease

## 2013-07-14 VITALS — BP 142/80 | HR 96 | Ht 67.0 in | Wt 372.0 lb

## 2013-07-14 DIAGNOSIS — E662 Morbid (severe) obesity with alveolar hypoventilation: Secondary | ICD-10-CM

## 2013-07-14 DIAGNOSIS — Z23 Encounter for immunization: Secondary | ICD-10-CM

## 2013-07-14 DIAGNOSIS — I5032 Chronic diastolic (congestive) heart failure: Secondary | ICD-10-CM

## 2013-07-14 MED ORDER — FUROSEMIDE 40 MG PO TABS: 40.0000 mg | ORAL_TABLET | Freq: Two times a day (BID) | ORAL | Status: DC

## 2013-07-14 NOTE — Progress Notes (Signed)
  Subjective:    Patient ID: Chelsea [name], female    DOB: 1965-10-27, 47 y.o.   MRN: [id number]  HPI  PCP - Pietro Cassis   47/F,never smoker, morbidly obese for FU of obstructive sleep apnea/ OHS & diastolic heart failure .  On O2 since 2004 after cervical CA surgery, got ON CPAP around the same time - [facility] .  admitted to [facility] in [place] with pneumonia and respiratory failure requiring mech ventilation. Echocardiogram in January of 2012 showed normal LV function, mild left atrial enlargement and mild aortic insufficiency.  Advair prior / spiriva started in [facility] - unclear if this is helping  PSG 3/12 >> Severe obstructive sleep apnea with hypopneas causing oxygen desaturation and sleep fragmentation. This was corrected by CPAP of 18 cm with a C-Flex of 3 cm with 2 L of oxygen blended in. Limb movements seemed to emerge and increase on CPAP  At the time of this study, she weighed 322 pounds with a height of 5 feet 7 inches, BMI of 50 and neck size of 18 inches.  Hospitalised 3/9-3/17/12 for acute renal failure (on toresemide & metalazone ) & hypercarbic resp failure     CPAP was taken away due to poor compliance, required rpt PSG 10/12>>Study clarified biPAP requirement . bipap set up feb '13  bipap 20/14 sm ff mask h/h biflex+3cm     07/14/2013  Pt reports she went to [facility] ED 1 1/2 weeks ago for SOB. She was giving breathing tx, abx, prednisone. Her breathing is better now. C/o producitve cough w/ thick clear phlem (at times green). No wheezing and no chest tx right now. Pt reports she wears her biPAP machine everynight x 4-6 hrs a night w/ 3 liters O2.  Paid $160 for compression hose but cannot wear on left due to abd pain when bending Wt 372 Compliant with O2  she continues to have chronic leg swelling   Review of Systems neg for any significant sore throat, dysphagia, itching, sneezing, nasal congestion or excess/ purulent  secretions, fever, chills, sweats, unintended wt loss, pleuritic or exertional cp, hempoptysis, orthopnea pnd or change in chronic leg swelling. Also denies presyncope, palpitations, heartburn, abdominal pain, nausea, vomiting, diarrhea or change in bowel or urinary habits, dysuria,hematuria, rash, arthralgias, visual complaints, headache, numbness weakness or ataxia.     Objective:   Physical Exam  Gen. Pleasant, obese, in no distress ENT - no lesions, no post nasal drip Neck: No JVD, no thyromegaly, no carotid bruits Lungs: no use of accessory muscles, no dullness to percussion, decreased without rales or rhonchi  Cardiovascular: Rhythm regular, heart sounds  normal, no murmurs or gallops, 2+ peripheral edema Musculoskeletal: No deformities, no cyanosis or clubbing , no tremors       Assessment & Plan:

## 2013-07-14 NOTE — Patient Instructions (Signed)
FLu shot Increase lasix to 40 mg twice daily - Rx will be sent x 60 tabs x 1 refill Download on biPAP

## 2013-07-17 NOTE — Assessment & Plan Note (Signed)
Download on biPAP Consider advanced modes of ventilation if central apneas  Weight loss encouraged, compliance with goal of at least 4-6 hrs every night is the expectation. Advised against medications with sedative side effects Cautioned against driving when sleepy - understanding that sleepiness will vary on a day to day basis

## 2013-07-17 NOTE — Assessment & Plan Note (Signed)
She has gained wt again inspite of her diuretic regimen FLu shot Increase lasix to 40 mg twice daily - Rx will be sent x 60 tabs x 1 refill

## 2013-08-03 ENCOUNTER — Ambulatory Visit: Payer: Self-pay | Admitting: Podiatry

## 2013-08-04 ENCOUNTER — Encounter: Payer: Medicare Other | Admitting: Cardiology

## 2013-08-04 NOTE — Progress Notes (Signed)
HPI: FU diastolic CHF. Echocardiogram 3/12 showed an ejection fraction of 60-65% and a mildly elevated LVOT gradient felt secondary to hyperdynamic function or mild AS. There was a mildly elevated gradient in her thoracic aorta. There was trace AI. TEE 6/12 demonstrated normal LVF, Moderate biatrial enlargement, mild AS with a mean gradient of 16, mild AI/MR/TR. The patient has not been seen since July of 2012. Note the patient is followed closely by pulmonary for obesity hypoventilation syndrome and sleep apnea.  Current Outpatient Prescriptions  Medication Sig Dispense Refill  . albuterol (VENTOLIN HFA) 108 (90 BASE) MCG/ACT inhaler Inhale 2 puffs into the lungs every 6 (six) hours as needed for wheezing.  1 Inhaler  5  . aspirin 81 MG tablet Take 81 mg by mouth daily.        . celecoxib (CELEBREX) 200 MG capsule Take 200 mg by mouth daily. Or as directed      . doxycycline (VIBRAMYCIN) 100 MG capsule Twice daily      . ferrous sulfate 325 (65 FE) MG tablet Take 325 mg by mouth 2 (two) times daily. On Hold      . Fluticasone-Salmeterol (ADVAIR DISKUS) 500-50 MCG/DOSE AEPB Inhale 1 puff into the lungs every 12 (twelve) hours.        . formoterol (PERFOROMIST) 20 MCG/2ML nebulizer solution Take 20 mcg by nebulization as needed.      . furosemide (LASIX) 40 MG tablet Take 1 tablet (40 mg total) by mouth 2 (two) times daily.  60 tablet  1  . gabapentin (NEURONTIN) 600 MG tablet Take 600 mg by mouth 3 (three) times daily.       Marland Kitchen glucosamine-chondroitin 500-400 MG tablet Take 1 tablet by mouth 2 (two) times daily.        . insulin detemir (LEVEMIR) 100 UNIT/ML injection Inject 60 Units into the skin at bedtime. As directed      . levothyroxine (SYNTHROID, LEVOTHROID) 150 MCG tablet TAKE 1 TABLET EVERY DAY  90 tablet  2  . Multiple Vitamin (MULTIVITAMIN) tablet Take 1 tablet by mouth daily.        . NON FORMULARY Oxygen 3 liters and cpap      . omeprazole (PRILOSEC) 20 MG capsule Take 1  capsule (20 mg total) by mouth daily.  30 capsule  6  . oxyCODONE-acetaminophen (ENDOCET) 10-325 MG per tablet Take 1.5 tablets by mouth every 6 (six) hours as needed for pain.      . potassium chloride SA (K-DUR,KLOR-CON) 20 MEQ tablet Take 20 mEq by mouth daily.       . potassium chloride SA (KLOR-CON M20) 20 MEQ tablet TAKE 3 TABLETS  A DAY      . traMADol (ULTRAM) 50 MG tablet Take 50 mg by mouth every 6 (six) hours as needed.       No current facility-administered medications for this visit.     Past Medical History  Diagnosis Date  . GERD (gastroesophageal reflux disease)   . Hypothyroid   . Hypertension   . OSA (obstructive sleep apnea)   . Obesity hypoventilation syndrome   . Diastolic congestive heart failure     Echo 02/21/11: EF 55-60%, moderate LVH, grade 2 diastolic dysfunction, mild to moderate aortic stenosis with a mean gradient 23 mm of mercury  . Cervical cancer   . Diabetes mellitus   . Hyperlipidemia   . Aortic stenosis     Echocardiogram 02/21/11:  Mean gradient 23 mm of mercury;  peak gradient 36;; Turbulence noted in the area of the aortic isthmus with peak gradient 23 mmHg-consider MRI to assess for coarctation;    TEE: 04/02/11:  EF 55-60%, mod BAE, trileaflet AV with mild AS (pk and mean 25 and 16), mild AI, mild MR, mild TR, atrial septal aneurysm, no evidence of corarctation  . COPD (chronic obstructive pulmonary disease)   . CHF (congestive heart failure)   . Coronary artery disease     Past Surgical History  Procedure Laterality Date  . Cholecystectomy    . Tonsillectomy    . Abdominal hysterectomy      History   Social History  . Marital Status: Single    Spouse Name: N/A    Number of Children: N/A  . Years of Education: N/A   Occupational History  . disabled    Social History Main Topics  . Smoking status: Never Smoker   . Smokeless tobacco: Never Used  . Alcohol Use: 0.0 oz/week     Comment: Rarely  . Drug Use: No  . Sexual Activity:  Not on file   Other Topics Concern  . Not on file   Social History Narrative  . No narrative on file    ROS: no fevers or chills, productive cough, hemoptysis, dysphasia, odynophagia, melena, hematochezia, dysuria, hematuria, rash, seizure activity, orthopnea, PND, pedal edema, claudication. Remaining systems are negative.  Physical Exam: Well-developed well-nourished in no acute distress.  Skin is warm and dry.  HEENT is normal.  Neck is supple.  Chest is clear to auscultation with normal expansion.  Cardiovascular exam is regular rate and rhythm.  Abdominal exam nontender or distended. No masses palpated. Extremities show no edema. neuro grossly intact  ECG     This encounter was created in error - please disregard.

## 2013-08-19 ENCOUNTER — Ambulatory Visit: Payer: Self-pay | Admitting: Podiatry

## 2013-09-03 ENCOUNTER — Encounter: Payer: Self-pay | Admitting: Cardiology

## 2013-11-19 ENCOUNTER — Encounter: Payer: Self-pay | Admitting: Cardiology

## 2015-03-04 DIAGNOSIS — J449 Chronic obstructive pulmonary disease, unspecified: Secondary | ICD-10-CM | POA: Insufficient documentation

## 2015-03-04 DIAGNOSIS — J441 Chronic obstructive pulmonary disease with (acute) exacerbation: Secondary | ICD-10-CM | POA: Insufficient documentation

## 2015-03-04 HISTORY — DX: Chronic obstructive pulmonary disease with (acute) exacerbation: J44.1

## 2015-03-04 HISTORY — DX: Chronic obstructive pulmonary disease, unspecified: J44.9

## 2015-08-19 ENCOUNTER — Encounter: Payer: Self-pay | Admitting: Adult Health

## 2015-08-19 ENCOUNTER — Telehealth: Payer: Self-pay | Admitting: Adult Health

## 2015-08-19 ENCOUNTER — Ambulatory Visit (INDEPENDENT_AMBULATORY_CARE_PROVIDER_SITE_OTHER): Payer: Medicare HMO | Admitting: Adult Health

## 2015-08-19 VITALS — BP 138/72 | HR 80 | Temp 98.0°F | Ht 65.0 in | Wt 356.0 lb

## 2015-08-19 DIAGNOSIS — G4733 Obstructive sleep apnea (adult) (pediatric): Secondary | ICD-10-CM

## 2015-08-19 DIAGNOSIS — I5032 Chronic diastolic (congestive) heart failure: Secondary | ICD-10-CM

## 2015-08-19 DIAGNOSIS — Z23 Encounter for immunization: Secondary | ICD-10-CM

## 2015-08-19 DIAGNOSIS — E662 Morbid (severe) obesity with alveolar hypoventilation: Secondary | ICD-10-CM | POA: Diagnosis not present

## 2015-08-19 DIAGNOSIS — I350 Nonrheumatic aortic (valve) stenosis: Secondary | ICD-10-CM

## 2015-08-19 DIAGNOSIS — J40 Bronchitis, not specified as acute or chronic: Secondary | ICD-10-CM

## 2015-08-19 NOTE — Assessment & Plan Note (Signed)
Needs follow up with cards

## 2015-08-19 NOTE — Progress Notes (Signed)
Subjective:    Patient ID: TASA SADD, female    DOB: [birth year], 49 y.o.   MRN: [id number]  HPI 49 /F,never smoker, morbidly obese for FU of obstructive sleep apnea/ OHS & diastolic heart failure .   TEST /Event  On O2 since 2004 after cervical CA surgery, got ON CPAP around the same time - [facility] .  admitted to [facility] in [place] with pneumonia and respiratory failure requiring mech ventilation.  Echocardiogram in January of 2012 showed normal LV function, mild left atrial enlargement and mild aortic insufficiency.  Advair prior / spiriva started in [facility] - unclear if this is helping  PSG 3/12 >> Severe obstructive sleep apnea with hypopneas causing oxygen desaturation and sleep fragmentation. This was corrected by CPAP of 18 cm with a C-Flex of 3 cm with 2 L of oxygen blended in. Limb movements seemed to emerge and increase on CPAP  At the time of this study, she weighed 322 pounds with a height of 5 feet 7 inches, BMI of 50 and neck size of 18 inches.  Hospitalised 3/9-3/17/12 for acute renal failure (on toresemide & metalazone ) & hypercarbic resp failure   CPAP was taken away due to poor compliance, required rpt PSG 10/12>>Study clarified biPAP requirement . bipap set up feb '13  bipap 20/14 sm ff mask h/h biflex+3cm   08/19/2015 Follow up : OSA/OHS, chronic respiratory failure on oxygen and Diastolic CHF  Patient returns to reestablish for obstructive sleep apnea. Patient has severe obstructive sleep apnea and obesity hypoventilation syndrome. She has previously been on C Pap and BiPAP. Last seen 07/2013 .  Patient says that she's had depression and several family issues and has not been able to come to office visits. Says she has been using her C Pap most every night for about 5 hours and feels rested. Is planning to switch homecare companies. He is currently with [facility] but wishes to switch to [facility]. We do  not have a current download. Her DME was contacted for a download DME says they have her listed as BIPAP 20/14 . They do not have CPAP listed. Pt will have to check her machines at home as she says she has both, She uses CPAP at her house and BIPAP at boyfriend house.  . She remains on oxygen 2 L at rest and 3 L with activity. On room air. Patient's O2 saturation at rest is 87%. She improved on oxygen at 2 L at rest to 94%.  She has previously been followed by cardiology for grade 2 diastolic heart failure and mild aortic stenosis. She has chronic leg swelling. On lasix .   She says she was dx with COPD by PCP and is on Perforomist .  Previously on Advair and Spiriva .  She is a never smoker. No PFT on record.   She says she is doing better in last couple of months. Starting to exercise with oxygen . Has lost a few pounds. Her sob is at baseline. She denies any chest pain, orthopnea, PND, or increased leg swelling    Past Medical History  Diagnosis Date  . GERD (gastroesophageal reflux disease)   . Hypothyroid   . Hypertension   . OSA (obstructive sleep apnea)   . Obesity hypoventilation syndrome ([place])   . Diastolic congestive heart failure ([place])     Echo 02/21/11: EF 55-60%, moderate LVH, grade 2 diastolic dysfunction, mild to moderate aortic stenosis with  a mean gradient 23 mm of mercury  . Cervical cancer ([place])   . Diabetes mellitus   . Hyperlipidemia   . Aortic stenosis     Echocardiogram 02/21/11:  Mean gradient 23 mm of mercury; peak gradient 36;; Turbulence noted in the area of the aortic isthmus with peak gradient 23 mmHg-consider MRI to assess for coarctation;    TEE: 04/02/11:  EF 55-60%, mod BAE, trileaflet AV with mild AS (pk and mean 25 and 16), mild AI, mild MR, mild TR, atrial septal aneurysm, no evidence of corarctation  . COPD (chronic obstructive pulmonary disease) ([place])   . CHF (congestive heart failure) ([place])   . Coronary artery disease     Current Outpatient  Prescriptions on File Prior to Visit  Medication Sig Dispense Refill  . albuterol (VENTOLIN HFA) 108 (90 BASE) MCG/ACT inhaler Inhale 2 puffs into the lungs every 6 (six) hours as needed for wheezing. 1 Inhaler 5  . aspirin 81 MG tablet Take 81 mg by mouth daily.      . ferrous sulfate 325 (65 FE) MG tablet Take 325 mg by mouth 2 (two) times daily. On Hold    . formoterol (PERFOROMIST) 20 MCG/2ML nebulizer solution Take 20 mcg by nebulization as needed.    . furosemide (LASIX) 40 MG tablet Take 1 tablet (40 mg total) by mouth 2 (two) times daily. 60 tablet 1  . gabapentin (NEURONTIN) 600 MG tablet Take 600 mg by mouth 3 (three) times daily.     Marland Kitchen glucosamine-chondroitin 500-400 MG tablet Take 1 tablet by mouth 2 (two) times daily.      . insulin detemir (LEVEMIR) 100 UNIT/ML injection Inject 60 Units into the skin at bedtime. As directed    . levothyroxine (SYNTHROID, LEVOTHROID) 150 MCG tablet TAKE 1 TABLET EVERY DAY 90 tablet 2  . Multiple Vitamin (MULTIVITAMIN) tablet Take 1 tablet by mouth daily.      . NON FORMULARY Oxygen 3 liters and cpap    . traMADol (ULTRAM) 50 MG tablet Take 50 mg by mouth every 6 (six) hours as needed.    . Fluticasone-Salmeterol (ADVAIR DISKUS) 500-50 MCG/DOSE AEPB Inhale 1 puff into the lungs every 12 (twelve) hours.       No current facility-administered medications on file prior to visit.     Review of Systems Constitutional:   No  weight loss, night sweats,  Fevers, chills, + fatigue, or  lassitude.  HEENT:   No headaches,  Difficulty swallowing,  Tooth/dental problems, or  Sore throat,                No sneezing, itching, ear ache, nasal congestion, post nasal drip,   CV:  No chest pain,  Orthopnea, PND, swelling in lower extremities, anasarca, dizziness, palpitations, syncope.   GI  No heartburn, indigestion, abdominal pain, nausea, vomiting, diarrhea, change in bowel habits, loss of appetite, bloody stools.   Resp:  No chest wall deformity  Skin:  no rash or lesions.  GU: no dysuria, change in color of urine, no urgency or frequency.  No flank pain, no hematuria   MS:  No joint pain or swelling.  No decreased range of motion.  No back pain.  Psych:  No change in mood or affect. No depression or anxiety.  No memory loss.         Objective:   Physical Exam  GEN: A/Ox3; pleasant , NAD, morbidly obese Vital signs reviewed  HEENT:  [place]/AT,  EACs-clear, TMs-wnl, NOSE-clear, THROAT-clear,  no lesions, no postnasal drip or exudate noted. Class III MP airway  NECK:  Supple w/ fair ROM; no JVD; normal carotid impulses w/o bruits; no thyromegaly or nodules palpated; no lymphadenopathy.  RESP  diminished breath sounds in the bases  w/o, wheezes/ rales/ or rhonchi.no accessory muscle use, no dullness to percussion  CARD:  RRR, no m/r/g  , 1+ peripheral edema, pulses intact, no cyanosis or clubbing. Venous insufficiency changes with stasis dermatitis  GI:   Soft & nt; nml bowel sounds; no organomegaly or masses detected.  Musco: Warm bil, no deformities or joint swelling noted.   Neuro: alert, no focal deficits noted.    Skin: Warm, no lesions or rashes        Assessment & Plan:

## 2015-08-19 NOTE — Assessment & Plan Note (Signed)
Unclear if pt is wearing CPAP or BIPAP  Will have her get download to verify and then check control  For now cont on CPAP as that is what pt says she is using .   Plan   Continue on CPAP At bedtime   Goal is to wear for at least 6hr .  Do not drive if sleepy.  Work on weight loss.  Continue with oxygen 2l/m rest and 3l/m activity.  Flu shot today .  Continue on current regimen  follow up Dr. Elsworth Soho  In 2 months with PFT and As needed

## 2015-08-19 NOTE — Patient Instructions (Signed)
Will make follow up appointment with Dr. [name] in cardiology .  Continue on CPAP At bedtime   Goal is to wear for at least 6hr .  Do not drive if sleepy.  Work on weight loss.  Continue with oxygen 2l/m rest and 3l/m activity.  Flu shot today .  Continue on current regimen  follow up Dr. Elsworth Soho  In 2 months with PFT and As needed

## 2015-08-19 NOTE — Assessment & Plan Note (Signed)
Carries dx of COPD however never smoker Will have PFT on return  Cont on current regimen for now

## 2015-08-19 NOTE — Telephone Encounter (Signed)
Spoke with pt and advised that we need her to take current machine that she is using whether it is bipap or cpap to [facility] and get them to do a download asap and fax to our office so we can see what she is currently doing.  Once this is obtained we can send new orders for oxygen and cpap or bipap to new company [employer]).  Pt verbalized understanding.  Estill Bamberg will follow up.

## 2015-08-19 NOTE — Assessment & Plan Note (Signed)
Continue on CPAP At bedtime   Will need download to make sure control is okay , if not may need to see if BIPAP is more appropriate.

## 2015-08-19 NOTE — Addendum Note (Signed)
Addended by: Osa Craver on: 08/19/2015 12:15 PM   Modules accepted: Orders

## 2015-08-21 NOTE — Progress Notes (Signed)
Reviewed & agree with plan  

## 2015-08-22 NOTE — Telephone Encounter (Signed)
Order placed on 11/18, DL has not yet been received. Will await DL as the order was just placed on Friday 11/18.

## 2015-08-23 NOTE — Telephone Encounter (Signed)
Chelsea [name] with [facility] requesting oxygen for the patient, [phone].

## 2015-08-23 NOTE — Telephone Encounter (Signed)
Per [facility] - all orders need to be sent to [facility], not [facility].Chelsea [name] does not cover [facility].   Order for oxygen sent to [facility]. Nothing further needed. Closing encounter

## 2015-09-28 DIAGNOSIS — E785 Hyperlipidemia, unspecified: Secondary | ICD-10-CM | POA: Insufficient documentation

## 2015-09-28 DIAGNOSIS — E1169 Type 2 diabetes mellitus with other specified complication: Secondary | ICD-10-CM | POA: Insufficient documentation

## 2015-10-20 ENCOUNTER — Ambulatory Visit: Payer: Medicare Other | Admitting: Pulmonary Disease

## 2016-05-01 ENCOUNTER — Ambulatory Visit: Payer: Medicare Other | Admitting: Pulmonary Disease

## 2016-05-27 DIAGNOSIS — J9601 Acute respiratory failure with hypoxia: Secondary | ICD-10-CM

## 2016-05-27 HISTORY — DX: Acute respiratory failure with hypoxia: J96.01

## 2016-06-05 ENCOUNTER — Ambulatory Visit (INDEPENDENT_AMBULATORY_CARE_PROVIDER_SITE_OTHER): Payer: Medicare Other | Admitting: Adult Health

## 2016-06-05 ENCOUNTER — Other Ambulatory Visit (INDEPENDENT_AMBULATORY_CARE_PROVIDER_SITE_OTHER): Payer: Medicare Other

## 2016-06-05 ENCOUNTER — Encounter: Payer: Self-pay | Admitting: Adult Health

## 2016-06-05 VITALS — BP 110/70 | HR 77 | Temp 98.1°F | Ht 65.0 in | Wt 351.0 lb

## 2016-06-05 DIAGNOSIS — I5032 Chronic diastolic (congestive) heart failure: Secondary | ICD-10-CM | POA: Diagnosis not present

## 2016-06-05 DIAGNOSIS — G4733 Obstructive sleep apnea (adult) (pediatric): Secondary | ICD-10-CM | POA: Diagnosis not present

## 2016-06-05 DIAGNOSIS — R059 Cough, unspecified: Secondary | ICD-10-CM

## 2016-06-05 DIAGNOSIS — J40 Bronchitis, not specified as acute or chronic: Secondary | ICD-10-CM | POA: Diagnosis not present

## 2016-06-05 DIAGNOSIS — R05 Cough: Secondary | ICD-10-CM

## 2016-06-05 DIAGNOSIS — E662 Morbid (severe) obesity with alveolar hypoventilation: Secondary | ICD-10-CM

## 2016-06-05 LAB — BASIC METABOLIC PANEL
BUN: 12 mg/dL (ref 6–23)
CHLORIDE: 97 meq/L (ref 96–112)
CO2: 36 meq/L — AB (ref 19–32)
CREATININE: 0.71 mg/dL (ref 0.40–1.20)
Calcium: 9.2 mg/dL (ref 8.4–10.5)
GFR: 91.74 mL/min (ref >60.00)
GLUCOSE: 140 mg/dL — AB (ref 70–99)
Potassium: 4.2 mEq/L (ref 3.5–5.1)
Sodium: 137 mEq/L (ref 135–145)

## 2016-06-05 LAB — BRAIN NATRIURETIC PEPTIDE: Pro B Natriuretic peptide (BNP): 37 pg/mL (ref 0.0–100.0)

## 2016-06-05 NOTE — Assessment & Plan Note (Signed)
?   Flare vs COPD -pt has never smoked, it is unlikely she has COPD .  Suspect dyspnea is multifactoral . Need PFT  Will have her return for PFT  Check cxr today .

## 2016-06-05 NOTE — Assessment & Plan Note (Signed)
Cont on CPAP At bedtime  

## 2016-06-05 NOTE — Assessment & Plan Note (Signed)
Check BNP  if elevated, consider echo vs card referrar

## 2016-06-05 NOTE — Progress Notes (Signed)
Subjective:    Patient ID: Chelsea [name], female    DOB: 1966/03/06, 50 y.o.   MRN: [id number]  HPI 46 /F,never smoker, morbidly obese for FU of obstructive sleep apnea/ OHS & diastolic heart failure .   TEST /Event  On O2 since 2004 after cervical CA surgery, got ON CPAP around the same time - [facility] .  admitted to [facility] in [place] with pneumonia and respiratory failure requiring mech ventilation.  Echocardiogram in January of 2012 showed normal LV function, mild left atrial enlargement and mild aortic insufficiency.  Advair prior / spiriva started in [place] - unclear if this is helping  PSG 3/12 >> Severe obstructive sleep apnea with hypopneas causing oxygen desaturation and sleep fragmentation. This was corrected by CPAP of 18 cm with a C-Flex of 3 cm with 2 L of oxygen blended in. Limb movements seemed to emerge and increase on CPAP  At the time of this study, she weighed 322 pounds with a height of 5 feet 7 inches, BMI of 50 and neck size of 18 inches.  Hospitalised 3/9-3/17/12 for acute renal failure (on toresemide & metalazone ) & hypercarbic resp failure   CPAP was taken away due to poor compliance, required rpt PSG 10/12>>Study clarified biPAP requirement . bipap set up feb '13  bipap 20/14 sm ff mask h/h biflex+3cm   06/05/2016 Follow up : OSA/OHS, chronic respiratory failure on oxygen and Diastolic CHF  Pt returns for post hospital follow up .  She was recently admitted at [facility] for UTI and COPD exaerbation ( ? Never smoker )  She was tx with abx. She is feeling better but still has dyspnea and wears out easily with minimal walking.  Weight is around 350Lbs -stable for her. Has chronic leg swelling , no significant change. Uses Lasix As needed  Only now.  She says she was dx with COPD by PCP and is on Perforomist . She has never smoked.  Previously on Advair and Spiriva .  She is a never smoker. No PFT on record. She  was recommended for PFT last last year but did not return.  Remains on CPAP At bedtime  , says she wears on regular basis.  She denies chest pain, hemoptysis, fever or orthopnea.      Past Medical History:  Diagnosis Date  . Aortic stenosis    Echocardiogram 02/21/11:  Mean gradient 23 mm of mercury; peak gradient 36;; Turbulence noted in the area of the aortic isthmus with peak gradient 23 mmHg-consider MRI to assess for coarctation;    TEE: 04/02/11:  EF 55-60%, mod BAE, trileaflet AV with mild AS (pk and mean 25 and 16), mild AI, mild MR, mild TR, atrial septal aneurysm, no evidence of corarctation  . Cervical cancer ([place])   . CHF (congestive heart failure) ([place])   . COPD (chronic obstructive pulmonary disease) ([place])   . Coronary artery disease   . Diabetes mellitus   . Diastolic congestive heart failure ([place])    Echo 02/21/11: EF 55-60%, moderate LVH, grade 2 diastolic dysfunction, mild to moderate aortic stenosis with a mean gradient 23 mm of mercury  . GERD (gastroesophageal reflux disease)   . Hyperlipidemia   . Hypertension   . Hypothyroid   . Obesity hypoventilation syndrome ([place])   . OSA (obstructive sleep apnea)     Current Outpatient Prescriptions on File Prior to Visit  Medication Sig Dispense Refill  . albuterol (VENTOLIN HFA) 108 (90  BASE) MCG/ACT inhaler Inhale 2 puffs into the lungs every 6 (six) hours as needed for wheezing. 1 Inhaler 5  . aspirin 81 MG tablet Take 81 mg by mouth daily.      . ferrous sulfate 325 (65 FE) MG tablet Take 325 mg by mouth 2 (two) times daily. On Hold    . furosemide (LASIX) 40 MG tablet Take 1 tablet (40 mg total) by mouth 2 (two) times daily. 60 tablet 1  . gabapentin (NEURONTIN) 600 MG tablet Take 600 mg by mouth 3 (three) times daily.     Marland Kitchen glucosamine-chondroitin 500-400 MG tablet Take 1 tablet by mouth 2 (two) times daily.      . insulin detemir (LEVEMIR) 100 UNIT/ML injection Inject 60 Units into the skin at bedtime. As directed    .  levothyroxine (SYNTHROID, LEVOTHROID) 150 MCG tablet TAKE 1 TABLET EVERY DAY 90 tablet 2  . Multiple Vitamin (MULTIVITAMIN) tablet Take 1 tablet by mouth daily.      . NON FORMULARY Oxygen 3 liters and cpap    . traMADol (ULTRAM) 50 MG tablet Take 50 mg by mouth every 6 (six) hours as needed.    . formoterol (PERFOROMIST) 20 MCG/2ML nebulizer solution Take 20 mcg by nebulization as needed.     No current facility-administered medications on file prior to visit.      Review of Systems Constitutional:   No  weight loss, night sweats,  Fevers, chills, + fatigue, or  lassitude.  HEENT:   No headaches,  Difficulty swallowing,  Tooth/dental problems, or  Sore throat,                No sneezing, itching, ear ache, nasal congestion, post nasal drip,   CV:  No chest pain,  Orthopnea, PND, swelling in lower extremities, anasarca, dizziness, palpitations, syncope.   GI  No heartburn, indigestion, abdominal pain, nausea, vomiting, diarrhea, change in bowel habits, loss of appetite, bloody stools.   Resp:  No chest wall deformity  Skin: no rash or lesions.  GU: no dysuria, change in color of urine, no urgency or frequency.  No flank pain, no hematuria   MS:  No joint pain or swelling.  No decreased range of motion.  No back pain.  Psych:  No change in mood or affect. No depression or anxiety.  No memory loss.         Objective:   Physical Exam  Vitals:   06/05/16 1531  BP: 110/70  Pulse: 77  Temp: 98.1 F (36.7 C)  TempSrc: Oral  SpO2: 94%  Weight: (!) 351 lb (159.2 kg)  Height: 5\' 5"  (1.651 m)     GEN: A/Ox3; pleasant , NAD, morbidly obese Vital signs reviewed   HEENT:  [place]/AT,  EACs-clear, TMs-wnl, NOSE-clear, THROAT-clear, no lesions, no postnasal drip or exudate noted. Class III MP airway  NECK:  Supple w/ fair ROM; no JVD; normal carotid impulses w/o bruits; no thyromegaly or nodules palpated; no lymphadenopathy.    RESP  diminished breath sounds in the bases  w/o,  wheezes/ rales/ or rhonchi. no accessory muscle use, no dullness to percussion  CARD:  RRR, no m/r/g  , 1+ peripheral edema, pulses intact, no cyanosis or clubbing. Venous insufficiency changes with stasis dermatitis  GI:   Soft & nt; nml bowel sounds; no organomegaly or masses detected.   Musco: Warm bil, no deformities or joint swelling noted.   Neuro: alert, no focal deficits noted.    Skin: Warm, no  lesions or rashes   Tammy Parrett NP-C  [facility]  06/05/2016

## 2016-06-05 NOTE — Patient Instructions (Addendum)
Chest xray and labs today .  Stop perforomist .  Begin Symbicort 80 2 puffs Twice daily  ,  Continue on CPAP At bedtime   Goal is to wear for at least 6hr .  Do not drive if sleepy.  Work on weight loss.  Continue with oxygen 2l/m rest and 3l/m activity.  Continue on current regimen  Follow up Dr. Elsworth Soho  In 3-4 weeks  PFT and As needed   Please contact office for sooner follow up if symptoms do not improve or worsen or seek emergency care

## 2016-06-05 NOTE — Assessment & Plan Note (Signed)
Cont on o2  Cont on CPAP At bedtime

## 2016-06-06 NOTE — Progress Notes (Signed)
lmtcb x1 

## 2016-06-07 NOTE — Progress Notes (Signed)
Lmtcb x 2

## 2016-06-11 NOTE — Progress Notes (Signed)
lmtcb x3 

## 2016-06-13 NOTE — Progress Notes (Signed)
Called spoke with pt. Reviewed results and recs. Pt voiced understanding and had no further questions.

## 2016-06-16 NOTE — Progress Notes (Signed)
Reviewed & agree with plan  

## 2016-07-04 ENCOUNTER — Ambulatory Visit (HOSPITAL_COMMUNITY)
Admission: RE | Admit: 2016-07-04 | Discharge: 2016-07-04 | Disposition: A | Discharge status: Home / Self Care | Payer: Medicare Other | Source: Ambulatory Visit | Attending: Pulmonary Disease | Admitting: Pulmonary Disease

## 2016-07-04 ENCOUNTER — Ambulatory Visit (INDEPENDENT_AMBULATORY_CARE_PROVIDER_SITE_OTHER): Payer: Medicare Other | Admitting: Pulmonary Disease

## 2016-07-04 ENCOUNTER — Encounter: Payer: Self-pay | Admitting: Pulmonary Disease

## 2016-07-04 DIAGNOSIS — G4733 Obstructive sleep apnea (adult) (pediatric): Secondary | ICD-10-CM

## 2016-07-04 DIAGNOSIS — E662 Morbid (severe) obesity with alveolar hypoventilation: Secondary | ICD-10-CM | POA: Diagnosis not present

## 2016-07-04 DIAGNOSIS — I5032 Chronic diastolic (congestive) heart failure: Secondary | ICD-10-CM | POA: Diagnosis not present

## 2016-07-04 DIAGNOSIS — R942 Abnormal results of pulmonary function studies: Secondary | ICD-10-CM | POA: Insufficient documentation

## 2016-07-04 DIAGNOSIS — R059 Cough, unspecified: Secondary | ICD-10-CM

## 2016-07-04 DIAGNOSIS — R05 Cough: Secondary | ICD-10-CM | POA: Insufficient documentation

## 2016-07-04 DIAGNOSIS — J449 Chronic obstructive pulmonary disease, unspecified: Secondary | ICD-10-CM | POA: Diagnosis not present

## 2016-07-04 DIAGNOSIS — Z23 Encounter for immunization: Secondary | ICD-10-CM

## 2016-07-04 LAB — PULMONARY FUNCTION TEST
DL/VA % pred: 109 %
DL/VA: 5.38 ml/min/mmHg/L
DLCO UNC: 10.52 ml/min/mmHg
DLCO unc % pred: 41 %
FEF 25-75 POST: 2.84 L/s
FEF 25-75 Pre: 1.7 L/sec
FEF2575-%CHANGE-POST: 67 %
FEF2575-%PRED-POST: 104 %
FEF2575-%PRED-PRE: 62 %
FEV1-%Change-Post: 9 %
FEV1-%PRED-POST: 47 %
FEV1-%PRED-PRE: 43 %
FEV1-PRE: 1.23 L
FEV1-Post: 1.35 L
FEV1FVC-%CHANGE-POST: 1 %
FEV1FVC-%Pred-Pre: 110 %
FEV6-%CHANGE-POST: 8 %
FEV6-%Pred-Post: 42 %
FEV6-%Pred-Pre: 39 %
FEV6-Post: 1.51 L
FEV6-Pre: 1.39 L
FEV6FVC-%Pred-Post: 103 %
FEV6FVC-%Pred-Pre: 103 %
FVC-%Change-Post: 8 %
FVC-%Pred-Post: 41 %
FVC-%Pred-Pre: 38 %
FVC-Post: 1.51 L
FVC-Pre: 1.39 L
POST FEV1/FVC RATIO: 90 %
PRE FEV1/FVC RATIO: 89 %
Post FEV6/FVC ratio: 100 %
Pre FEV6/FVC Ratio: 100 %

## 2016-07-04 MED ORDER — FUROSEMIDE 40 MG PO TABS: ORAL_TABLET | ORAL | 1 refills | Status: DC

## 2016-07-04 MED ORDER — ALBUTEROL SULFATE (2.5 MG/3ML) 0.083% IN NEBU
2.5000 mg | INHALATION_SOLUTION | Freq: Once | RESPIRATORY_TRACT | Status: AC
  Administered 2016-07-04: 2.5 mg via RESPIRATORY_TRACT

## 2016-07-04 MED ORDER — ALBUTEROL SULFATE (2.5 MG/3ML) 0.083% IN NEBU: 2.5000 mg | INHALATION_SOLUTION | Freq: Four times a day (QID) | RESPIRATORY_TRACT | 12 refills | Status: DC | PRN

## 2016-07-04 NOTE — Addendum Note (Signed)
Addended by: Mathis Dad on: 07/04/2016 05:13 PM   Modules accepted: Orders

## 2016-07-04 NOTE — Assessment & Plan Note (Signed)
You do not have COPD. I think the main issue is fluid retention. This may get worse if you do not use her BiPAP  Flu shot today  Refill on albuterol nebs #30 X2

## 2016-07-04 NOTE — Assessment & Plan Note (Signed)
   Take Lasix 40 mg twice daily x 2 weeks, then go back down to once daily  BiPAP supplies will be renewed-[facility]

## 2016-07-04 NOTE — Addendum Note (Signed)
Addended by: Mathis Dad on: 07/04/2016 05:03 PM   Modules accepted: Orders

## 2016-07-04 NOTE — Assessment & Plan Note (Signed)
You do not have COPD. I think the main issue is fluid retention. This may get worse if you do not use her BiPAP   Refill on albuterol nebs #30 X2

## 2016-07-04 NOTE — Progress Notes (Signed)
   Subjective:    Patient ID: Chelsea [name], female    DOB: 1966/06/11, 50 y.o.   MRN: [id number]  HPI  3 /F,never smoker, morbidly obese for FU of obstructive sleep apnea/ OHS & diastolic heart failure .  On O2 since 2004 after cervical CA surgery, got ON CPAP around the same time - [facility] .    Chief Complaint  Patient presents with  . Follow-up    breathing has not been doing well.  SOB with exertion.  trouble breathing at night when sleeping.  Missing a piece on CPAP machine, unable to use CPAP until she gets piece missing from her mask.     She was recently admitted at [facility] for UTI and COPD exaerbation ( ? Never smoker )  Previously on Advair and Spiriva -Now on Symbicort-given samples she has 15 more days left. She is about to start pulmonary rehabilitation at [facility]  She takes Lasix 40 mg once daily, has significant increase in bipedal edema, weight has increased to about 355 pounds She is compliant with oxygen, has been unable to use her BiPAP last 2 days since she is missing the piece that connects the hose to the mask  She uses albuterol nebs as needed PFTs were reviewed today    Significant tests/ events   admitted to [facility] in [place] with pneumonia and respiratory failure requiring mech ventilation.   PSG 11/2010 >> (322 lbs) Severe obstructive sleep apnea with hypopneas causing oxygen desaturation and sleep fragmentation. This was corrected by CPAP of 18 cm with a C-Flex of 3 cm with 2 L of oxygen blended in. Limb movements seemed to emerge and increase on CPAP    CPAP was taken away due to poor compliance, required rpt PSG 07/2011>>Study clarified biPAP requirement . bipap set up feb '13  bipap 20/14 sm ff mask h/h biflex+3cm    PFT 07/2016 >> No airway obstruction with a ratio of 80, severe restriction with FVC 41%, DLCO not accurate but corrects for alveolar volume  Review of Systems  neg for  any significant sore throat, dysphagia, itching, sneezing, nasal congestion or excess/ purulent secretions, fever, chills, sweats, unintended wt loss, pleuritic or exertional cp, hempoptysis, orthopnea pnd or change in chronic leg swelling.   Also denies presyncope, palpitations, heartburn, abdominal pain, nausea, vomiting, diarrhea or change in bowel or urinary habits, dysuria,hematuria, rash, arthralgias, visual complaints, headache, numbness weakness or ataxia.     Objective:   Physical Exam   Gen. Pleasant, obese, in no distress ENT - no lesions, no post nasal drip Neck: No JVD, no thyromegaly, no carotid bruits Lungs: no use of accessory muscles, no dullness to percussion, decreased without rales or rhonchi  Cardiovascular: Rhythm regular, heart sounds  normal, no murmurs or gallops, 2+ peripheral edema Musculoskeletal: No deformities, no cyanosis or clubbing , no tremors        Assessment & Plan:

## 2016-07-04 NOTE — Patient Instructions (Signed)
You do not have COPD. I think the main issue is fluid retention. This may get worse if you do not use her BiPAP  Flu shot today  Refill on albuterol nebs #30 X2  Take Lasix 40 mg twice daily x 2 weeks, then go back down to once daily  BiPAP supplies will be renewed-[facility]

## 2016-10-04 ENCOUNTER — Ambulatory Visit: Payer: Medicare Other | Admitting: Adult Health

## 2017-08-24 DIAGNOSIS — J159 Unspecified bacterial pneumonia: Secondary | ICD-10-CM | POA: Insufficient documentation

## 2017-08-24 HISTORY — DX: Unspecified bacterial pneumonia: J15.9

## 2017-09-11 ENCOUNTER — Institutional Professional Consult (permissible substitution): Payer: Medicare Other | Admitting: Pulmonary Disease

## 2017-09-25 DIAGNOSIS — I5042 Chronic combined systolic (congestive) and diastolic (congestive) heart failure: Secondary | ICD-10-CM | POA: Insufficient documentation

## 2017-09-25 DIAGNOSIS — I509 Heart failure, unspecified: Secondary | ICD-10-CM | POA: Insufficient documentation

## 2017-09-25 DIAGNOSIS — I5033 Acute on chronic diastolic (congestive) heart failure: Secondary | ICD-10-CM

## 2017-09-25 HISTORY — DX: Acute on chronic diastolic (congestive) heart failure: I50.33

## 2017-10-15 ENCOUNTER — Institutional Professional Consult (permissible substitution): Payer: Medicaid Other | Admitting: Pulmonary Disease

## 2017-12-04 ENCOUNTER — Other Ambulatory Visit: Payer: Self-pay

## 2017-12-04 ENCOUNTER — Emergency Department (HOSPITAL_COMMUNITY): Payer: Medicare HMO

## 2017-12-04 ENCOUNTER — Encounter (HOSPITAL_COMMUNITY): Payer: Self-pay

## 2017-12-04 DIAGNOSIS — I350 Nonrheumatic aortic (valve) stenosis: Secondary | ICD-10-CM | POA: Diagnosis present

## 2017-12-04 DIAGNOSIS — Z9119 Patient's noncompliance with other medical treatment and regimen: Secondary | ICD-10-CM

## 2017-12-04 DIAGNOSIS — Z888 Allergy status to other drugs, medicaments and biological substances status: Secondary | ICD-10-CM

## 2017-12-04 DIAGNOSIS — E1142 Type 2 diabetes mellitus with diabetic polyneuropathy: Secondary | ICD-10-CM | POA: Diagnosis present

## 2017-12-04 DIAGNOSIS — I110 Hypertensive heart disease with heart failure: Secondary | ICD-10-CM | POA: Diagnosis present

## 2017-12-04 DIAGNOSIS — J9622 Acute and chronic respiratory failure with hypercapnia: Secondary | ICD-10-CM | POA: Diagnosis present

## 2017-12-04 DIAGNOSIS — Z90710 Acquired absence of both cervix and uterus: Secondary | ICD-10-CM

## 2017-12-04 DIAGNOSIS — E872 Acidosis: Secondary | ICD-10-CM | POA: Diagnosis present

## 2017-12-04 DIAGNOSIS — I890 Lymphedema, not elsewhere classified: Secondary | ICD-10-CM | POA: Diagnosis present

## 2017-12-04 DIAGNOSIS — E785 Hyperlipidemia, unspecified: Secondary | ICD-10-CM | POA: Diagnosis present

## 2017-12-04 DIAGNOSIS — J9621 Acute and chronic respiratory failure with hypoxia: Secondary | ICD-10-CM | POA: Diagnosis present

## 2017-12-04 DIAGNOSIS — I5033 Acute on chronic diastolic (congestive) heart failure: Secondary | ICD-10-CM | POA: Diagnosis not present

## 2017-12-04 DIAGNOSIS — Z794 Long term (current) use of insulin: Secondary | ICD-10-CM

## 2017-12-04 DIAGNOSIS — E662 Morbid (severe) obesity with alveolar hypoventilation: Secondary | ICD-10-CM | POA: Diagnosis not present

## 2017-12-04 DIAGNOSIS — Z6841 Body Mass Index (BMI) 40.0 and over, adult: Secondary | ICD-10-CM

## 2017-12-04 DIAGNOSIS — Z8249 Family history of ischemic heart disease and other diseases of the circulatory system: Secondary | ICD-10-CM

## 2017-12-04 DIAGNOSIS — I2510 Atherosclerotic heart disease of native coronary artery without angina pectoris: Secondary | ICD-10-CM | POA: Diagnosis present

## 2017-12-04 DIAGNOSIS — Z87892 Personal history of anaphylaxis: Secondary | ICD-10-CM

## 2017-12-04 DIAGNOSIS — Z880 Allergy status to penicillin: Secondary | ICD-10-CM

## 2017-12-04 DIAGNOSIS — K219 Gastro-esophageal reflux disease without esophagitis: Secondary | ICD-10-CM | POA: Diagnosis present

## 2017-12-04 DIAGNOSIS — Z8541 Personal history of malignant neoplasm of cervix uteri: Secondary | ICD-10-CM

## 2017-12-04 DIAGNOSIS — J9601 Acute respiratory failure with hypoxia: Secondary | ICD-10-CM | POA: Diagnosis not present

## 2017-12-04 DIAGNOSIS — Z9981 Dependence on supplemental oxygen: Secondary | ICD-10-CM

## 2017-12-04 DIAGNOSIS — E039 Hypothyroidism, unspecified: Secondary | ICD-10-CM | POA: Diagnosis present

## 2017-12-04 LAB — BRAIN NATRIURETIC PEPTIDE: B Natriuretic Peptide: 48.5 pg/mL (ref 0.0–100.0)

## 2017-12-04 LAB — BASIC METABOLIC PANEL
Anion gap: 12 (ref 5–15)
BUN: 9 mg/dL (ref 6–20)
CALCIUM: 9.7 mg/dL (ref 8.9–10.3)
CO2: 30 mmol/L (ref 22–32)
CREATININE: 0.79 mg/dL (ref 0.44–1.00)
Chloride: 99 mmol/L — ABNORMAL LOW (ref 101–111)
GFR calc non Af Amer: >60 mL/min (ref >60)
GLUCOSE: 156 mg/dL — AB (ref 65–99)
Potassium: 3.8 mmol/L (ref 3.5–5.1)
Sodium: 141 mmol/L (ref 135–145)

## 2017-12-04 LAB — CBC
HCT: 41 % (ref 36.0–46.0)
Hemoglobin: 12.5 g/dL (ref 12.0–15.0)
MCH: 29.5 pg (ref 26.0–34.0)
MCHC: 30.5 g/dL (ref 30.0–36.0)
MCV: 96.7 fL (ref 78.0–100.0)
PLATELETS: 194 10*3/uL (ref 150–400)
RBC: 4.24 MIL/uL (ref 3.87–5.11)
RDW: 13.3 % (ref 11.5–15.5)
WBC: 9.1 10*3/uL (ref 4.0–10.5)

## 2017-12-04 LAB — I-STAT BETA HCG BLOOD, ED (MC, WL, AP ONLY): I-stat hCG, quantitative: <5 m[IU]/mL (ref <5)

## 2017-12-04 LAB — I-STAT TROPONIN, ED: TROPONIN I, POC: 0 ng/mL (ref 0.00–0.08)

## 2017-12-04 NOTE — ED Triage Notes (Signed)
Pt states that she in the hospital last week with heart failure and pneumonia. Pt states that she is feeling worse. Pt wears 2L o2.

## 2017-12-05 ENCOUNTER — Inpatient Hospital Stay (HOSPITAL_COMMUNITY): Payer: Medicare HMO

## 2017-12-05 ENCOUNTER — Other Ambulatory Visit (HOSPITAL_COMMUNITY): Payer: Medicaid Other

## 2017-12-05 ENCOUNTER — Other Ambulatory Visit: Payer: Self-pay

## 2017-12-05 ENCOUNTER — Inpatient Hospital Stay (HOSPITAL_COMMUNITY)
Admission: EM | Admit: 2017-12-05 | Discharge: 2017-12-12 | DRG: 205 | Disposition: A | Discharge status: Home Health | Payer: Medicare HMO | Attending: Oncology | Admitting: Oncology

## 2017-12-05 ENCOUNTER — Encounter (HOSPITAL_COMMUNITY): Payer: Self-pay | Admitting: General Practice

## 2017-12-05 DIAGNOSIS — E662 Morbid (severe) obesity with alveolar hypoventilation: Secondary | ICD-10-CM | POA: Diagnosis present

## 2017-12-05 DIAGNOSIS — E872 Acidosis: Secondary | ICD-10-CM

## 2017-12-05 DIAGNOSIS — R0689 Other abnormalities of breathing: Secondary | ICD-10-CM | POA: Diagnosis not present

## 2017-12-05 DIAGNOSIS — I2510 Atherosclerotic heart disease of native coronary artery without angina pectoris: Secondary | ICD-10-CM | POA: Diagnosis present

## 2017-12-05 DIAGNOSIS — R0600 Dyspnea, unspecified: Secondary | ICD-10-CM | POA: Diagnosis present

## 2017-12-05 DIAGNOSIS — Z79890 Hormone replacement therapy (postmenopausal): Secondary | ICD-10-CM | POA: Diagnosis not present

## 2017-12-05 DIAGNOSIS — E119 Type 2 diabetes mellitus without complications: Secondary | ICD-10-CM | POA: Diagnosis not present

## 2017-12-05 DIAGNOSIS — Z90710 Acquired absence of both cervix and uterus: Secondary | ICD-10-CM | POA: Diagnosis not present

## 2017-12-05 DIAGNOSIS — E039 Hypothyroidism, unspecified: Secondary | ICD-10-CM

## 2017-12-05 DIAGNOSIS — I890 Lymphedema, not elsewhere classified: Secondary | ICD-10-CM

## 2017-12-05 DIAGNOSIS — Z6841 Body Mass Index (BMI) 40.0 and over, adult: Secondary | ICD-10-CM

## 2017-12-05 DIAGNOSIS — E1142 Type 2 diabetes mellitus with diabetic polyneuropathy: Secondary | ICD-10-CM | POA: Diagnosis present

## 2017-12-05 DIAGNOSIS — Z8701 Personal history of pneumonia (recurrent): Secondary | ICD-10-CM

## 2017-12-05 DIAGNOSIS — Z8709 Personal history of other diseases of the respiratory system: Secondary | ICD-10-CM

## 2017-12-05 DIAGNOSIS — J9601 Acute respiratory failure with hypoxia: Secondary | ICD-10-CM | POA: Diagnosis present

## 2017-12-05 DIAGNOSIS — Z9119 Patient's noncompliance with other medical treatment and regimen: Secondary | ICD-10-CM

## 2017-12-05 DIAGNOSIS — I350 Nonrheumatic aortic (valve) stenosis: Secondary | ICD-10-CM | POA: Diagnosis present

## 2017-12-05 DIAGNOSIS — I50812 Chronic right heart failure: Secondary | ICD-10-CM | POA: Diagnosis not present

## 2017-12-05 DIAGNOSIS — Z8541 Personal history of malignant neoplasm of cervix uteri: Secondary | ICD-10-CM | POA: Diagnosis not present

## 2017-12-05 DIAGNOSIS — J449 Chronic obstructive pulmonary disease, unspecified: Secondary | ICD-10-CM

## 2017-12-05 DIAGNOSIS — I5033 Acute on chronic diastolic (congestive) heart failure: Secondary | ICD-10-CM | POA: Diagnosis present

## 2017-12-05 DIAGNOSIS — Z794 Long term (current) use of insulin: Secondary | ICD-10-CM | POA: Diagnosis not present

## 2017-12-05 DIAGNOSIS — K219 Gastro-esophageal reflux disease without esophagitis: Secondary | ICD-10-CM | POA: Diagnosis present

## 2017-12-05 DIAGNOSIS — I110 Hypertensive heart disease with heart failure: Secondary | ICD-10-CM

## 2017-12-05 DIAGNOSIS — Z880 Allergy status to penicillin: Secondary | ICD-10-CM

## 2017-12-05 DIAGNOSIS — M792 Neuralgia and neuritis, unspecified: Secondary | ICD-10-CM | POA: Diagnosis present

## 2017-12-05 DIAGNOSIS — Z79899 Other long term (current) drug therapy: Secondary | ICD-10-CM | POA: Diagnosis not present

## 2017-12-05 DIAGNOSIS — J9622 Acute and chronic respiratory failure with hypercapnia: Secondary | ICD-10-CM | POA: Diagnosis present

## 2017-12-05 DIAGNOSIS — E785 Hyperlipidemia, unspecified: Secondary | ICD-10-CM

## 2017-12-05 DIAGNOSIS — E1140 Type 2 diabetes mellitus with diabetic neuropathy, unspecified: Secondary | ICD-10-CM | POA: Diagnosis not present

## 2017-12-05 DIAGNOSIS — Z9989 Dependence on other enabling machines and devices: Secondary | ICD-10-CM

## 2017-12-05 DIAGNOSIS — I5030 Unspecified diastolic (congestive) heart failure: Secondary | ICD-10-CM | POA: Diagnosis not present

## 2017-12-05 DIAGNOSIS — Z9981 Dependence on supplemental oxygen: Secondary | ICD-10-CM

## 2017-12-05 DIAGNOSIS — Z7951 Long term (current) use of inhaled steroids: Secondary | ICD-10-CM

## 2017-12-05 DIAGNOSIS — R011 Cardiac murmur, unspecified: Secondary | ICD-10-CM | POA: Diagnosis not present

## 2017-12-05 DIAGNOSIS — I10 Essential (primary) hypertension: Secondary | ICD-10-CM | POA: Diagnosis present

## 2017-12-05 DIAGNOSIS — Z87892 Personal history of anaphylaxis: Secondary | ICD-10-CM | POA: Diagnosis not present

## 2017-12-05 DIAGNOSIS — Z79891 Long term (current) use of opiate analgesic: Secondary | ICD-10-CM

## 2017-12-05 DIAGNOSIS — Z888 Allergy status to other drugs, medicaments and biological substances status: Secondary | ICD-10-CM | POA: Diagnosis not present

## 2017-12-05 DIAGNOSIS — Z8249 Family history of ischemic heart disease and other diseases of the circulatory system: Secondary | ICD-10-CM

## 2017-12-05 DIAGNOSIS — J9621 Acute and chronic respiratory failure with hypoxia: Secondary | ICD-10-CM | POA: Diagnosis present

## 2017-12-05 HISTORY — DX: Unspecified viral hepatitis C without hepatic coma: B19.20

## 2017-12-05 HISTORY — DX: Unspecified osteoarthritis, unspecified site: M19.90

## 2017-12-05 HISTORY — DX: Dyspnea, unspecified: R06.00

## 2017-12-05 HISTORY — DX: Personal history of other diseases of the musculoskeletal system and connective tissue: Z87.39

## 2017-12-05 HISTORY — DX: Irritable bowel syndrome without diarrhea: K58.9

## 2017-12-05 HISTORY — DX: Obstructive sleep apnea (adult) (pediatric): G47.33

## 2017-12-05 HISTORY — DX: Dependence on supplemental oxygen: Z99.81

## 2017-12-05 HISTORY — DX: Cardiac murmur, unspecified: R01.1

## 2017-12-05 HISTORY — DX: Low back pain, unspecified: M54.50

## 2017-12-05 HISTORY — DX: Pneumonia, unspecified organism: J18.9

## 2017-12-05 HISTORY — DX: Migraine, unspecified, not intractable, without status migrainosus: G43.909

## 2017-12-05 HISTORY — DX: Unspecified asthma, uncomplicated: J45.909

## 2017-12-05 HISTORY — DX: Other chronic pain: G89.29

## 2017-12-05 HISTORY — DX: Type 2 diabetes mellitus without complications: E11.9

## 2017-12-05 HISTORY — DX: Low back pain: M54.5

## 2017-12-05 HISTORY — DX: Other complications of anesthesia, initial encounter: T88.59XA

## 2017-12-05 HISTORY — DX: Adverse effect of unspecified anesthetic, initial encounter: T41.45XA

## 2017-12-05 LAB — I-STAT ARTERIAL BLOOD GAS, ED
Acid-Base Excess: 11 mmol/L — ABNORMAL HIGH (ref 0.0–2.0)
BICARBONATE: 39.6 mmol/L — AB (ref 20.0–28.0)
O2 Saturation: 99 %
PCO2 ART: 69.1 mmHg — AB (ref 32.0–48.0)
PO2 ART: 130 mmHg — AB (ref 83.0–108.0)
Patient temperature: 97.7
TCO2: 42 mmol/L — ABNORMAL HIGH (ref 22–32)
pH, Arterial: 7.364 (ref 7.350–7.450)

## 2017-12-05 LAB — GLUCOSE, CAPILLARY: Glucose-Capillary: 180 mg/dL — ABNORMAL HIGH (ref 65–99)

## 2017-12-05 LAB — CBG MONITORING, ED: Glucose-Capillary: 138 mg/dL — ABNORMAL HIGH (ref 65–99)

## 2017-12-05 MED ORDER — IPRATROPIUM-ALBUTEROL 0.5-2.5 (3) MG/3ML IN SOLN
3.0000 mL | RESPIRATORY_TRACT | Status: DC | PRN
Start: 2017-12-05 — End: 2017-12-12

## 2017-12-05 MED ORDER — LEVOTHYROXINE SODIUM 75 MCG PO TABS
150.0000 ug | ORAL_TABLET | Freq: Every day | ORAL | Status: DC
  Administered 2017-12-06 – 2017-12-12 (×7): 150 ug via ORAL
  Filled 2017-12-05 (×7): qty 2

## 2017-12-05 MED ORDER — MOMETASONE FURO-FORMOTEROL FUM 100-5 MCG/ACT IN AERO
2.0000 | INHALATION_SPRAY | Freq: Two times a day (BID) | RESPIRATORY_TRACT | Status: DC
  Administered 2017-12-05 – 2017-12-12 (×12): 2 via RESPIRATORY_TRACT
  Filled 2017-12-05: qty 8.8

## 2017-12-05 MED ORDER — LOSARTAN POTASSIUM 25 MG PO TABS
25.0000 mg | ORAL_TABLET | Freq: Every day | ORAL | Status: DC
  Administered 2017-12-05 – 2017-12-12 (×8): 25 mg via ORAL
  Filled 2017-12-05 (×8): qty 1

## 2017-12-05 MED ORDER — SODIUM CHLORIDE 0.9% FLUSH
3.0000 mL | Freq: Two times a day (BID) | INTRAVENOUS | Status: DC
  Administered 2017-12-05 – 2017-12-12 (×12): 3 mL via INTRAVENOUS

## 2017-12-05 MED ORDER — SODIUM CHLORIDE 0.9% FLUSH
3.0000 mL | INTRAVENOUS | Status: DC | PRN
  Administered 2017-12-06: 3 mL via INTRAVENOUS
  Filled 2017-12-05: qty 3

## 2017-12-05 MED ORDER — ASPIRIN EC 81 MG PO TBEC
81.0000 mg | DELAYED_RELEASE_TABLET | Freq: Every day | ORAL | Status: DC
  Administered 2017-12-05 – 2017-12-12 (×8): 81 mg via ORAL
  Filled 2017-12-05 (×8): qty 1

## 2017-12-05 MED ORDER — SODIUM CHLORIDE 0.9 % IV SOLN: 250.0000 mL | INTRAVENOUS | Status: DC | PRN

## 2017-12-05 MED ORDER — ACETAMINOPHEN 650 MG RE SUPP: 650.0000 mg | Freq: Four times a day (QID) | RECTAL | Status: DC | PRN

## 2017-12-05 MED ORDER — INSULIN DETEMIR 100 UNIT/ML ~~LOC~~ SOLN
20.0000 [IU] | Freq: Every day | SUBCUTANEOUS | Status: DC
  Administered 2017-12-05 – 2017-12-09 (×5): 20 [IU] via SUBCUTANEOUS
  Filled 2017-12-05 (×6): qty 0.2

## 2017-12-05 MED ORDER — FERROUS SULFATE 325 (65 FE) MG PO TABS
325.0000 mg | ORAL_TABLET | Freq: Two times a day (BID) | ORAL | Status: DC
  Administered 2017-12-05 – 2017-12-12 (×15): 325 mg via ORAL
  Filled 2017-12-05 (×16): qty 1

## 2017-12-05 MED ORDER — LIDOCAINE 5 % EX PTCH
1.0000 | MEDICATED_PATCH | Freq: Once | CUTANEOUS | Status: AC
  Administered 2017-12-05: 1 via TRANSDERMAL
  Filled 2017-12-05: qty 1

## 2017-12-05 MED ORDER — ENOXAPARIN SODIUM 40 MG/0.4ML ~~LOC~~ SOLN
40.0000 mg | SUBCUTANEOUS | Status: DC
  Administered 2017-12-05: 40 mg via SUBCUTANEOUS
  Filled 2017-12-05: qty 0.4

## 2017-12-05 MED ORDER — FUROSEMIDE 10 MG/ML IJ SOLN
80.0000 mg | Freq: Two times a day (BID) | INTRAMUSCULAR | Status: DC
  Administered 2017-12-05 – 2017-12-09 (×8): 80 mg via INTRAVENOUS
  Filled 2017-12-05 (×8): qty 8

## 2017-12-05 MED ORDER — INSULIN ASPART 100 UNIT/ML ~~LOC~~ SOLN
0.0000 [IU] | Freq: Three times a day (TID) | SUBCUTANEOUS | Status: DC
  Administered 2017-12-06: 2 [IU] via SUBCUTANEOUS
  Administered 2017-12-06: 1 [IU] via SUBCUTANEOUS
  Administered 2017-12-06: 2 [IU] via SUBCUTANEOUS
  Administered 2017-12-07: 1 [IU] via SUBCUTANEOUS
  Administered 2017-12-07: 5 [IU] via SUBCUTANEOUS
  Administered 2017-12-08: 3 [IU] via SUBCUTANEOUS
  Administered 2017-12-08: 1 [IU] via SUBCUTANEOUS
  Administered 2017-12-09 (×2): 2 [IU] via SUBCUTANEOUS
  Administered 2017-12-09 – 2017-12-10 (×2): 1 [IU] via SUBCUTANEOUS
  Administered 2017-12-10: 3 [IU] via SUBCUTANEOUS
  Administered 2017-12-10: 1 [IU] via SUBCUTANEOUS
  Administered 2017-12-11 (×2): 2 [IU] via SUBCUTANEOUS
  Administered 2017-12-11: 1 [IU] via SUBCUTANEOUS
  Administered 2017-12-12 (×3): 2 [IU] via SUBCUTANEOUS

## 2017-12-05 MED ORDER — ACETAMINOPHEN 325 MG PO TABS
650.0000 mg | ORAL_TABLET | Freq: Four times a day (QID) | ORAL | Status: DC | PRN
  Administered 2017-12-05 – 2017-12-08 (×6): 650 mg via ORAL
  Filled 2017-12-05 (×6): qty 2

## 2017-12-05 MED ORDER — FUROSEMIDE 10 MG/ML IJ SOLN
80.0000 mg | Freq: Once | INTRAMUSCULAR | Status: AC
  Administered 2017-12-05: 80 mg via INTRAVENOUS
  Filled 2017-12-05: qty 8

## 2017-12-05 MED ORDER — GABAPENTIN 300 MG PO CAPS
300.0000 mg | ORAL_CAPSULE | Freq: Two times a day (BID) | ORAL | Status: DC
  Administered 2017-12-05 – 2017-12-12 (×15): 300 mg via ORAL
  Filled 2017-12-05 (×15): qty 1

## 2017-12-05 MED ORDER — IPRATROPIUM-ALBUTEROL 0.5-2.5 (3) MG/3ML IN SOLN
3.0000 mL | Freq: Once | RESPIRATORY_TRACT | Status: AC
  Administered 2017-12-05: 3 mL via RESPIRATORY_TRACT
  Filled 2017-12-05: qty 3

## 2017-12-05 NOTE — ED Notes (Signed)
Pt taken off Bipap per MD order.

## 2017-12-05 NOTE — ED Notes (Signed)
Report attempted 

## 2017-12-05 NOTE — ED Notes (Signed)
Dr. Charlynn Grimes paged @ 1436-per Myriam Jacobson, RN-paged by Levada Dy

## 2017-12-05 NOTE — ED Notes (Signed)
Pt alert and oriented x4. Skin warm and dry. Respirations equal and unlabored. NSR to cardiac monitor. Pt complaint of SOB. PT uses 2L of oxygen at home and Bi-pap at nighttime. Pt states with activity, SOB increases.

## 2017-12-05 NOTE — ED Notes (Signed)
RT and nurse transported patient to holding area.

## 2017-12-05 NOTE — H&P (Addendum)
Date: 12/05/2017               Patient Name:  Chelsea [name] MRN: [id number]  DOB: 12-10-1965 Age / Sex: 52 y.o., female   PCP: Patient, No Pcp Per         Medical Service: Internal Medicine Teaching Service         Attending Physician: Dr. Annia Belt, MD    First Contact: Dr. Maricela Bo Pager: [phone]  Second Contact: Dr. Danford Bad Pager: [phone]       After Hours (After 5p/  First Contact Pager: [phone]  weekends / holidays): Second Contact Pager: [phone]   Chief Complaint: Dyspnea with exertion  History of Present Illness:  Ms. Marmo is a 52 year old female with a past medical history significant for HFpEF, COPD on chronic 2L nasal cannula, obesity hypoventilation syndrome, diabetes mellitus type 2, hypertension, hyperlipidemia, chronic lymphedema presenting to the emergency room today with complaints of dyspnea with exertion.  He was recently admitted at [facility] from 11/17/17 to 11/25/17 with acute on chronic respiratory failure with hypoxia and hypercarbia thought to be secondary to the observation of her COPD and OHS due to pneumonia as well as CHF exacerbation.  She was initially admitted to the ICU on BiPAP support.  It appears that during her hospitalization she was intermittently compliant with the BiPAP and would have CO2 retention overnight when she refused to wear her BiPAP.  She initially received broad-spectrum antibiotics with vancomycin and cefepime but was de-escalated to Ceftin ear which was completed during hospitalization.  She reports that she was feeling well at time of discharge without any issues.  However, she reports that for the past week she has had progressively worsening dyspnea with exertion.  She notes that normally she is able to walk roughly 1 block before becoming short of breath.  Over the past week this has worsened and she is now only able to walk a few steps before becoming short of breath.  She also notes that she has  increased her oxygen at home from her normal 2L to 3-4 L.  She reports compliance with her medications to me today.  Additionally, she reports compliance at home with her BiPAP nightly however reported to the ED provider that she was not compliant with her BiPAP at night.  She notes stable 3 pillow orthopnea but does note worsening lower extremity edema.  She denies any fevers, chills, chest pain, palpitations, nausea/vomiting/diarrhe.  She does note a cough with scant clear mucus production unchanged from the previous hospitalization.  In the emergency room she was noted to have some decreased breath sounds at her bases as well as some wheezing present.  She was somnolent in the ED and fell asleep at which time she became hypoxic.  This improved after placing her on BiPAP which she is supposed to wear at night.  Meds:  Current Meds  Medication Sig  . albuterol (PROVENTIL) (2.5 MG/3ML) 0.083% nebulizer solution Take 3 mLs (2.5 mg total) by nebulization every 6 (six) hours as needed for wheezing or shortness of breath (I50.32).  Marland Kitchen albuterol (VENTOLIN HFA) 108 (90 BASE) MCG/ACT inhaler Inhale 2 puffs into the lungs every 6 (six) hours as needed for wheezing.  Marland Kitchen aspirin 81 MG tablet Take 81 mg by mouth daily.    . ferrous sulfate 325 (65 FE) MG tablet Take 325 mg by mouth 2 (two) times daily. On Hold  . furosemide (LASIX) 40 MG tablet  Take 1 tablet (40 mg total) by mouth 2 (two) times daily.  Marland Kitchen gabapentin (NEURONTIN) 300 MG capsule Take 300 mg by mouth 2 (two) times daily.  Marland Kitchen glucosamine-chondroitin 500-400 MG tablet Take 1 tablet by mouth 2 (two) times daily.    . insulin detemir (LEVEMIR) 100 UNIT/ML injection Inject 60 Units into the skin at bedtime. As directed  . levothyroxine (SYNTHROID, LEVOTHROID) 150 MCG tablet TAKE 1 TABLET EVERY DAY  . losartan (COZAAR) 25 MG tablet Take 25 mg by mouth daily.   . Multiple Vitamin (MULTIVITAMIN) tablet Take 1 tablet by mouth daily.    . TRADJENTA 5 MG TABS  tablet Take 5 mg by mouth daily.  . traMADol (ULTRAM) 50 MG tablet Take 50 mg by mouth every 6 (six) hours as needed for moderate pain.      Allergies: Allergies as of 12/04/2017 - Review Complete 12/04/2017  Allergen Reaction Noted  . Minoxidil  03/14/2011  . Penicillins  11/28/2010   Past Medical History:  Diagnosis Date  . Aortic stenosis    Echocardiogram 02/21/11:  Mean gradient 23 mm of mercury; peak gradient 36;; Turbulence noted in the area of the aortic isthmus with peak gradient 23 mmHg-consider MRI to assess for coarctation;    TEE: 04/02/11:  EF 55-60%, mod BAE, trileaflet AV with mild AS (pk and mean 25 and 16), mild AI, mild MR, mild TR, atrial septal aneurysm, no evidence of corarctation  . Cervical cancer ([place])   . CHF (congestive heart failure) ([place])   . COPD (chronic obstructive pulmonary disease) ([place])   . Coronary artery disease   . Diabetes mellitus   . Diastolic congestive heart failure ([place])    Echo 02/21/11: EF 55-60%, moderate LVH, grade 2 diastolic dysfunction, mild to moderate aortic stenosis with a mean gradient 23 mm of mercury  . GERD (gastroesophageal reflux disease)   . Hyperlipidemia   . Hypertension   . Hypothyroid   . Obesity hypoventilation syndrome ([place])   . OSA (obstructive sleep apnea)     Family History:  Family History  Problem Relation Age of Onset  . Heart disease Father    Social History:  Social History   Socioeconomic History  . Marital status: Single    Spouse name: None  . Number of children: None  . Years of education: None  . Highest education level: None  Social Needs  . Financial resource strain: None  . Food insecurity - worry: None  . Food insecurity - inability: None  . Transportation needs - medical: None  . Transportation needs - non-medical: None  Occupational History  . Occupation: disabled  Tobacco Use  . Smoking status: Never Smoker  . Smokeless tobacco: Never Used  Substance and Sexual Activity  .  Alcohol use: Yes    Alcohol/week: 0.0 oz    Comment: Rarely  . Drug use: No  . Sexual activity: None  Other Topics Concern  . None  Social History Narrative  . None   Review of Systems: A complete ROS was negative except as per HPI.   Physical Exam: Blood pressure 120/74, pulse 86, temperature 97.7 F (36.5 C), temperature source Oral, resp. rate (!) 23, height 5\' 5"  (1.651 m), weight (!) 346 lb (156.9 kg), SpO2 98 %. Physical Exam General: alert, morbidly obese, somnolent but easily arousable to voice, chronically ill-appearing woman in no acute distress Head: normocephalic and atraumatic.  Eyes: vision grossly intact, pupils equal, pupils round, pupils reactive to light, no injection and  anicteric.  Mouth: pharynx pink and moist, no erythema, and no exudates.  Neck: supple, full ROM, no thyromegaly, unable to assess for JVD given body habitus, and no carotid bruits.  Lungs: normal respiratory effort, no accessory muscle use, diminished breath sounds at bilateral bases, no wheezes or crackles appreciated heart: normal rate, regular rhythm, no murmur, no gallop, and no rub.  Abdomen: Peace abdomen, soft, non-tender, normal bowel sounds, no distention, no guarding Msk: no joint swelling, no joint warmth, and no redness over joints.  Pulses: 2+ DP/PT pulses bilaterally Extremities: 1+ pitting edema to knees bilaterally, lymphedema with chronic venous congestion changes on her lower extremities bilaterally. Neurologic: alert & oriented X3, cranial nerves II-XII intact, no focal deficits Skin: turgor normal and no rashes.  Psych: Normal mood and affect  EKG: personally reviewed my interpretation is normal sinus rhythm, nonspecific T wave changes.  CXR: personally reviewed my interpretation is cardiomegaly with vascular congestion and minimal edema.  Enlarged pulmonary hila suggestive of pulmonary hypertension.  Assessment & Plan by Problem:  Dyspnea with exertion: Patient with  progressive 1 week history of dyspnea with exertion following discharge from recent hospitalization.  Her dyspnea is likely multifactorial.  She has underlying lung disease with COPD, grade 2 diastolic heart failure noted on 01/2011 echo, obesity hypoventilation syndrome and OSA with noncompliance of home BiPAP nightly.  Additionally, she was recently hospitalized and treated for pneumonia.  No signs of pneumonia on chest x-ray.  However, it is notable for cardiomegaly and vascular congestion as well as enlarged pulmonary hila suggestive of pulmonary hypertension.  Her labs in the ED were fairly unremarkable.  She had a normal BNP of 48 though this may be falsely lowered due to her obesity.  Her volume status is difficult to assess given her body habitus.  Unable to assess for JVD, no crackles appreciated, lower extremity edema appears chronic.  Surprisingly her bicarb was only 30 and improved from previous labs over 35.  However this is still suggestive of chronic hypercapnia from likely obesity hypoventilation syndrome.  No systemic signs of infection.  No wheezes appreciated on exam however she did receive a nebulizer treatment in the emergency room.  Overall, it is likely that her chronic hypoventilation with hypercapnia noncompliance of her BiPAP at night is her most pressing issue leading to this dyspnea.  There may also be some component of heart failure and will check an echo for this as well as to assess for pulmonary hypertension.  We will continue diuresis with Lasix IV 80 mg twice daily and assess response with strict I's and O's and daily weights.  Continue her home Symbicort with duonebs as needed.  She appears to have tramadol 50 mg every 8 hours as needed on her home medication list though the indication is unclear.  Would like to avoid opiates as able as this will further suppress her respiratory drive.  BiPAP nightly.  Obtain ABG to assess for level of hypercapnia as she remains  somnolent.  Hypertension: Pressure well controlled in the ED.  Continue her home losartan 25 mg daily.  Diabetes mellitus type 2: Glucose 156 on BMP.  She takes Levemir 30 units nightly, linagliptin 5 mg every morning for diabetes.  Will give her Levemir 20 units nightly and sliding scale insulin sensitive here.  CBGs q. before meals and nightly.  Carb modified diet.  Hyperlipidemia: Continue home lovastatin  COPD: Per problem #1.  No indication of COPD exacerbation currently.  Continue home Symbicort.  Heart  failure with preserved ejection fracture: Per problem #1.  Will obtain repeat echo.   Hypothyroidism: Continue home Synthroid  DVT prophylaxis: Lovenox  Dispo: Admit patient to Inpatient with expected length of stay greater than 2 midnights.  Signed: Maryellen Pile, MD 12/05/2017, 9:17 AM  Pager: [phone]

## 2017-12-05 NOTE — ED Provider Notes (Signed)
[place] EMERGENCY DEPARTMENT Provider Note   CSN: [id number] Arrival date & time: 12/04/17  1853     History   Chief Complaint Chief Complaint  Patient presents with  . Shortness of Breath    HPI Chelsea [name] is a 52 y.o. female.  The history is provided by the patient and a relative.  Shortness of Breath  This is a recurrent problem. The problem occurs frequently.The current episode started more than 2 days ago. The problem has been gradually worsening. Associated symptoms include cough and leg swelling. Pertinent negatives include no fever, no hemoptysis, no chest pain, no syncope and no abdominal pain. She has had prior hospitalizations. Associated medical issues include heart failure.  Patient with history of morbid obesity, CHF presents with increasing shortness of breath.  She was recently admitted to [facility] for similar episode, was feeling improved and discharged.  However, over the past week she has been having worsening shortness of breath.  She reports she can only ambulate short distances before getting out of breath.  She reports that she becomes short of breath even with oxygen.  No fever, no chest pain. She reports is not always compliant with meds She is a non-smoker  Past Medical History:  Diagnosis Date  . Aortic stenosis    Echocardiogram 02/21/11:  Mean gradient 23 mm of mercury; peak gradient 36;; Turbulence noted in the area of the aortic isthmus with peak gradient 23 mmHg-consider MRI to assess for coarctation;    TEE: 04/02/11:  EF 55-60%, mod BAE, trileaflet AV with mild AS (pk and mean 25 and 16), mild AI, mild MR, mild TR, atrial septal aneurysm, no evidence of corarctation  . Cervical cancer ([place])   . CHF (congestive heart failure) ([place])   . COPD (chronic obstructive pulmonary disease) ([place])   . Coronary artery disease   . Diabetes mellitus   . Diastolic congestive heart failure ([place])    Echo 02/21/11: EF 55-60%,  moderate LVH, grade 2 diastolic dysfunction, mild to moderate aortic stenosis with a mean gradient 23 mm of mercury  . GERD (gastroesophageal reflux disease)   . Hyperlipidemia   . Hypertension   . Hypothyroid   . Obesity hypoventilation syndrome ([place])   . OSA (obstructive sleep apnea)     Patient Active Problem List   Diagnosis Date Noted  . Bronchitis 05/30/2011  . Cellulitis, leg 04/29/2011  . Neuropathic pain, leg 04/09/2011  . Edema 03/22/2011  . Morbid obesity ([place]) 03/22/2011  . History of cervical cancer 03/22/2011  . Aortic stenosis 03/14/2011  . Ankle pain, left 03/14/2011  . Obesity hypoventilation syndrome ([place]) 01/01/2011  . Acute on chronic diastolic heart failure ([place]) 11/28/2010  . DEGENERATIVE JOINT DISEASE 11/28/2010  . Chronic diastolic heart failure ([place]) 11/14/2010  . UNSPECIFIED HYPOTHYROIDISM 11/13/2010  . UNSPEC COMBINED SYSTOLIC&DIASTOLIC HEART FAILURE 17/61/6073  . HYPERTHYROIDISM 11/10/2010  . DM 11/10/2010  . Obstructive sleep apnea 11/10/2010  . HYPERTENSION 11/10/2010  . GERD 11/10/2010    Past Surgical History:  Procedure Laterality Date  . ABDOMINAL HYSTERECTOMY    . CHOLECYSTECTOMY    . TONSILLECTOMY      OB History    No data available       Home Medications    Prior to Admission medications   Medication Sig Start Date End Date Taking? Authorizing Provider  albuterol (PROVENTIL) (2.5 MG/3ML) 0.083% nebulizer solution Take 3 mLs (2.5 mg total) by nebulization every 6 (six) hours as needed for wheezing  or shortness of breath (I50.32). 07/04/16  Yes [name], MD  albuterol (VENTOLIN HFA) 108 (90 BASE) MCG/ACT inhaler Inhale 2 puffs into the lungs every 6 (six) hours as needed for wheezing. 04/20/11 12/05/17 Yes [name], MD  aspirin 81 MG tablet Take 81 mg by mouth daily.     Yes [provider]  ferrous sulfate 325 (65 FE) MG tablet Take 325 mg by mouth 2 (two) times daily. On Hold 05/10/11  Yes Midge Minium, MD   furosemide (LASIX) 40 MG tablet Take 1 tablet (40 mg total) by mouth 2 (two) times daily. 07/14/13  Yes [name], MD  gabapentin (NEURONTIN) 300 MG capsule Take 300 mg by mouth 2 (two) times daily.   Yes [provider]  glucosamine-chondroitin 500-400 MG tablet Take 1 tablet by mouth 2 (two) times daily.     Yes [provider]  insulin detemir (LEVEMIR) 100 UNIT/ML injection Inject 60 Units into the skin at bedtime. As directed   Yes [provider]  levothyroxine (SYNTHROID, LEVOTHROID) 150 MCG tablet TAKE 1 TABLET EVERY DAY 04/28/11  Yes Midge Minium, MD  losartan (COZAAR) 25 MG tablet Take 25 mg by mouth daily.  03/23/16  Yes [provider]  Multiple Vitamin (MULTIVITAMIN) tablet Take 1 tablet by mouth daily.     Yes [provider]  TRADJENTA 5 MG TABS tablet Take 5 mg by mouth daily. 12/01/17  Yes [provider]  traMADol (ULTRAM) 50 MG tablet Take 50 mg by mouth every 6 (six) hours as needed for moderate pain.    Yes [provider]  furosemide (LASIX) 40 MG tablet Take 2 twice daily for 2 weeks, then daily Patient not taking: Reported on 12/05/2017 07/04/16   [name], MD  NON FORMULARY Oxygen 3 liters and cpap    [provider]    Family History Family History  Problem Relation Age of Onset  . Heart disease Father     Social History Social History   Tobacco Use  . Smoking status: Never Smoker  . Smokeless tobacco: Never Used  Substance Use Topics  . Alcohol use: Yes    Alcohol/week: 0.0 oz    Comment: Rarely  . Drug use: No     Allergies   Penicillins and Minoxidil   Review of Systems Review of Systems  Constitutional: Negative for fever.  Respiratory: Positive for cough and shortness of breath. Negative for hemoptysis.   Cardiovascular: Positive for leg swelling. Negative for chest pain and syncope.  Gastrointestinal: Negative for abdominal pain.  All other systems reviewed  and are negative.    Physical Exam Updated Vital Signs BP 138/88   Pulse 87   Temp 97.7 F (36.5 C) (Oral)   Resp (!) 22   Ht 1.651 m (5\' 5" )   Wt (!) 156.9 kg (346 lb)   SpO2 (!) 88%   BMI 57.58 kg/m   Physical Exam  CONSTITUTIONAL: Chronically ill-appearing HEAD: Normocephalic/atraumatic EYES: EOMI/PERRL ENMT: Mucous membranes moist NECK: supple no meningeal signs SPINE/BACK:entire spine nontender CV: S1/S2 noted, murmur noted LUNGS: Decreased breath sounds bilaterally, mild tachypnea ABDOMEN: soft, nontender, obese GU:no cva tenderness NEURO: Pt is awake/alert/appropriate, moves all extremitiesx4.  No facial droop.   EXTREMITIES: pulses normal/equal, full ROM, symmetric chronic appearing edema to bilateral lower extremities.  No signs of secondary infection SKIN: warm, color normal PSYCH: no abnormalities of mood noted, alert and oriented to situation  ED Treatments / Results  Labs (all labs ordered are listed, but only abnormal results are displayed) Labs Reviewed  BASIC METABOLIC PANEL - Abnormal; Notable for the following components:      Result Value   Chloride 99 (*)    Glucose, Bld 156 (*)    All other components within normal limits  CBC  BRAIN NATRIURETIC PEPTIDE  I-STAT TROPONIN, ED  I-STAT BETA HCG BLOOD, ED ([facility], [facility], [facility] ONLY)    EKG  EKG Interpretation  Date/Time:  Wednesday December 04 2017 19:01:03 EST Ventricular Rate:  86 PR Interval:  148 QRS Duration: 86 QT Interval:  350 QTC Calculation: 418 R Axis:   94 Text Interpretation:  Normal sinus rhythm Rightward axis Nonspecific T wave abnormality Abnormal ECG Confirmed by Ripley Fraise [phone]) on 12/05/2017 4:15:35 AM       Radiology Dg Chest 2 View  Result Date: 12/04/2017 CLINICAL DATA:  Heart failure and pneumonia EXAM: CHEST - 2 VIEW COMPARISON:  02/21/2011 FINDINGS: Cardiomegaly with enlarged pulmonary hila and vascular congestion. Mild diffuse interstitial opacity may reflect minimal  edema. Possible tiny left effusion. Aortic atherosclerosis. No pneumothorax. IMPRESSION: 1. Cardiomegaly with vascular congestion and mild diffuse interstitial opacities suggesting minimal edema. Possible tiny left effusion 2. Enlarged appearing pulmonary hila, query pulmonary hypertension. Electronically Signed   By: Donavan Foil M.D.   On: 12/04/2017 20:10    Procedures Procedures    Medications Ordered in ED Medications  furosemide (LASIX) injection 80 mg (80 mg Intravenous Given 12/05/17 0602)  ipratropium-albuterol (DUONEB) 0.5-2.5 (3) MG/3ML nebulizer solution 3 mL (3 mLs Nebulization Given 12/05/17 0432)     Initial Impression / Assessment and Plan / ED Course  I have reviewed the triage vital signs and the nursing notes.  Pertinent labs & imaging results that were available during my care of the patient were reviewed by me and considered in my medical decision making (see chart for details).     4:57 AM Patient with worsening shortness of breath of the past week.  Probably has worsening heart failure.  She has questionable medication compliance.  She reports Lasix due to does not work. We will give nebulized treatment as well as Lasix and reassess. 7:52 AM Patient with some worsening in the ED.  She fell asleep she became hypoxic.  She usually wears BiPAP when she sleeps.  We placed her on BiPAP and she appears improved.  Resting comfortably Due to worsening respiratory condition, worsening dyspnea on exertion, will admit Discussed with internal medicine service for admission Final Clinical Impressions(s) / ED Diagnoses   Final diagnoses:  Acute respiratory failure with hypoxia [facility])    ED Discharge Orders    None       Ripley Fraise, MD 12/05/17 [phone]

## 2017-12-05 NOTE — ED Notes (Signed)
Pt wishes to take BiPap off and eat. Admitting paged

## 2017-12-05 NOTE — Progress Notes (Signed)
Transported with RN to pod E from Q22 without complications.

## 2017-12-05 NOTE — ED Notes (Signed)
Pt requesting to take mask off. Pt transferred to Hospital bed.

## 2017-12-06 ENCOUNTER — Inpatient Hospital Stay (HOSPITAL_COMMUNITY): Payer: Medicare HMO

## 2017-12-06 DIAGNOSIS — I5030 Unspecified diastolic (congestive) heart failure: Secondary | ICD-10-CM

## 2017-12-06 LAB — ECHOCARDIOGRAM COMPLETE
HEIGHTINCHES: 65 in
WEIGHTICAEL: 5880.11 [oz_av]

## 2017-12-06 LAB — BASIC METABOLIC PANEL
ANION GAP: 12 (ref 5–15)
BUN: 6 mg/dL (ref 6–20)
CHLORIDE: 91 mmol/L — AB (ref 101–111)
CO2: 39 mmol/L — AB (ref 22–32)
Calcium: 8.9 mg/dL (ref 8.9–10.3)
Creatinine, Ser: 0.63 mg/dL (ref 0.44–1.00)
GFR calc Af Amer: >60 mL/min (ref >60)
GFR calc non Af Amer: >60 mL/min (ref >60)
GLUCOSE: 150 mg/dL — AB (ref 65–99)
POTASSIUM: 3.6 mmol/L (ref 3.5–5.1)
Sodium: 142 mmol/L (ref 135–145)

## 2017-12-06 LAB — HEMOGLOBIN A1C
HEMOGLOBIN A1C: 7.3 % — AB (ref 4.8–5.6)
Mean Plasma Glucose: 163 mg/dL

## 2017-12-06 LAB — GLUCOSE, CAPILLARY
GLUCOSE-CAPILLARY: 175 mg/dL — AB (ref 65–99)
GLUCOSE-CAPILLARY: 148 mg/dL — AB (ref 65–99)
Glucose-Capillary: 161 mg/dL — ABNORMAL HIGH (ref 65–99)
Glucose-Capillary: 136 mg/dL — ABNORMAL HIGH (ref 65–99)

## 2017-12-06 LAB — HIV ANTIBODY (ROUTINE TESTING W REFLEX): HIV Screen 4th Generation wRfx: NONREACTIVE

## 2017-12-06 MED ORDER — ENOXAPARIN SODIUM 80 MG/0.8ML ~~LOC~~ SOLN
80.0000 mg | SUBCUTANEOUS | Status: DC
  Administered 2017-12-06 – 2017-12-11 (×6): 80 mg via SUBCUTANEOUS
  Filled 2017-12-06 (×6): qty 0.8

## 2017-12-06 MED ORDER — ALUM & MAG HYDROXIDE-SIMETH 200-200-20 MG/5ML PO SUSP
30.0000 mL | ORAL | Status: DC | PRN
  Administered 2017-12-06: 30 mL via ORAL
  Filled 2017-12-06: qty 30

## 2017-12-06 NOTE — Progress Notes (Signed)
  Echocardiogram 2D Echocardiogram has been performed.  Chelsea [name] 12/06/2017, 12:36 PM

## 2017-12-06 NOTE — Progress Notes (Signed)
   Subjective:  [name] is doing better this morning. She reports her breathing is improved. She has no complaints this morning. No chest pain or palpations.   Objective:  Vital signs in last 24 hours: Vitals:   12/06/17 0600 12/06/17 0817 12/06/17 0821 12/06/17 0827  BP:  139/77    Pulse: 81 94    Resp: (!) 22 (!) 23    Temp:      TempSrc:      SpO2: 100% 97% 93% 95%  Weight:      Height:       Physical Exam  Constitutional: She appears well-developed and well-nourished. No distress.  HENT:  Head: Normocephalic and atraumatic.  Eyes: Conjunctivae are normal.  Cardiovascular: Normal rate, regular rhythm and normal heart sounds.  Respiratory: Effort normal and breath sounds normal. No respiratory distress. She has no wheezes.  GI: Soft. Bowel sounds are normal. She exhibits no distension. There is no tenderness.  Musculoskeletal: She exhibits tenderness (lower extremities to palpation).  Skin: She is not diaphoretic. No erythema.  Psychiatric: She has a normal mood and affect. Her behavior is normal. Judgment and thought content normal.    Assessment/Plan:  Dyspnea with exertion: Patient improved following treatment with BiPAP overnight. She is fully awake and oriented this morning without any somnolence. Net output yesterday was 1.9 L. Weight not accurate in the chart. She is symptomatically better though still requiring supplemental O2 greater than her baseline 2L. Renal function stable. This is most likely right sided heart failure secondary to her obesity hypoventilation syndrome with chronic hypercapnia. Will get ECHO today. Continue diuresis with IV lasix 80 mg bid. BiPAP qhs.   Hypertension: Continue her home losartan 25 mg daily.  Diabetes mellitus type 2: The patient's blood glucose over the past 24 hrs has ranged 150-175.  -Continue Levimir 20u qhs -SSI -CBG monitoring  -Carb modified diet  Hyperlipidemia: -Continue home lovastatin  Possible  COPD: Continue home Symbicort. Pulmonary notes from 2017 indicate that she does not have COPD.   Heart failure with preserved ejection fracture: Per problem #1.  Will obtain repeat echo.   Hypothyroidism: Continue home Synthroid  DVT PPx: Will consult pharmacy for lovenox dosing given patients morbid obesity  Dispo: Anticipated discharge in approximately 1-2 day(s).   Maryellen Pile, MD 12/06/2017, 1:48 PM Pager: [phone]

## 2017-12-06 NOTE — Progress Notes (Signed)
Pt not tolerating Bipap, desating into the 70's. Placed on 15L NRB, sats now 97%. Nurse aware. No distress. Will continue to monitor.

## 2017-12-07 DIAGNOSIS — R011 Cardiac murmur, unspecified: Secondary | ICD-10-CM

## 2017-12-07 LAB — BASIC METABOLIC PANEL
ANION GAP: 16 — AB (ref 5–15)
BUN: 7 mg/dL (ref 6–20)
CO2: 38 mmol/L — ABNORMAL HIGH (ref 22–32)
Calcium: 8.6 mg/dL — ABNORMAL LOW (ref 8.9–10.3)
Chloride: 86 mmol/L — ABNORMAL LOW (ref 101–111)
Creatinine, Ser: 0.6 mg/dL (ref 0.44–1.00)
Glucose, Bld: 147 mg/dL — ABNORMAL HIGH (ref 65–99)
POTASSIUM: 3.3 mmol/L — AB (ref 3.5–5.1)
SODIUM: 140 mmol/L (ref 135–145)

## 2017-12-07 LAB — GLUCOSE, CAPILLARY
GLUCOSE-CAPILLARY: 145 mg/dL — AB (ref 65–99)
Glucose-Capillary: 293 mg/dL — ABNORMAL HIGH (ref 65–99)
Glucose-Capillary: 126 mg/dL — ABNORMAL HIGH (ref 65–99)
Glucose-Capillary: 131 mg/dL — ABNORMAL HIGH (ref 65–99)

## 2017-12-07 MED ORDER — POTASSIUM CHLORIDE 20 MEQ/15ML (10%) PO SOLN
40.0000 meq | Freq: Once | ORAL | Status: AC
  Administered 2017-12-07: 40 meq via ORAL
  Filled 2017-12-07: qty 30

## 2017-12-07 NOTE — Plan of Care (Signed)
  Clinical Measurements: Ability to maintain clinical measurements within normal limits will improve 12/07/2017 2007 - Progressing by Irish Lack, RN   Clinical Measurements: Respiratory complications will improve 12/07/2017 2007 - Progressing by Irish Lack, RN

## 2017-12-07 NOTE — Progress Notes (Signed)
Patient call c/o Left side chest pain sharpe 5/10 on pain scale. Skin warm and dry resp. even and unlab. V.S.S.already on o2 at 4 l/m N.C.Did EKG and R.N. aware . Hx of GERD per patient gave 2 tylenol and Maalox 30 ml .

## 2017-12-07 NOTE — Progress Notes (Signed)
Patient placed on AVAPS mode for increased support.  Vt 440 EPAP 8 Min P 15 Max P 25 FiO2 60%

## 2017-12-07 NOTE — Progress Notes (Signed)
Patient sleeping with BiPapa on o 2 sats down to 70 % Patient has sleep apnea. Truman Hayward R.N. Aware and into assess patient. Resp. There. Call and will come see patient

## 2017-12-07 NOTE — Progress Notes (Signed)
RT at bed side to place pt on Bipap for the night.  Pt stated she is not ready for bed and will have RN call when she is ready. RN aware

## 2017-12-07 NOTE — Progress Notes (Signed)
No pain. And eating snack

## 2017-12-07 NOTE — Progress Notes (Addendum)
   Subjective:  Feeling better this morning, tired. Reports good urine output and significant improvement in lower extremity edema. She wants to get out of bed. She plans to start working with therapy to lose weight.  Objective:  Vital signs in last 24 hours: Vitals:   12/06/17 2047 12/07/17 0320 12/07/17 0322 12/07/17 0938  BP:   109/66   Pulse:  83 83   Resp:  (!) 22 (!) 25   Temp:   98.6 F (37 C)   TempSrc:   Axillary   SpO2: 92% 94% 95% 92%  Weight:   (!) 355 lb 6.1 oz (161.2 kg)   Height:       Physical Exam: General: obese woman, resting in bed, NAD on 2 L [place] Cardiac: RRR, 2/6 systolic murmury Pulm: faint bibasilar crackles Abd: soft, nontender, nondistended Ext: trace pedal edema Neuro: alert and oriented X3  Assessment/Plan: Heart failure with preserved ejection fracture Dyspnea with exertion: Net output yesterday was 2.9 L. Weight not accurate in the chart. She is symptomatically improving and diuresing well. Renal function stable. Suspect right sided heart failure secondary to her obesity hypoventilation syndrome with chronic hypercapnia.  - TTE shows EF 65-70%, grade 1 DD, mod AS, mod LVH, dilated IVC - Continue diuresis with IV lasix 80 mg bid - likely needs higher home oral diuretic dosing (furosemide or torsemide) - BiPAP qhs.  - K 3.3, supplement 40 mEq - BMET in AM - Daily weights, Strict I/O - PT eval  Hypertension: Continue home losartan 25 mg daily.  Diabetes mellitus type 2: -Continue Levimir 20u qhs -SSI -CBG monitoring  -Carb modified diet  Possible COPD: Pulmonary notes from 2017 indicate that she does not have COPD. On Dulera BID, duonebs prn.  Hypothyroidism: Continue home Synthroid  DVT PPx: Lovenox  Dispo: Anticipated discharge in approximately 2-3 day(s).   Zada Finders, MD  Internal Medicine PGY-3 12/07/2017, 9:42 AM

## 2017-12-08 LAB — BASIC METABOLIC PANEL
Anion gap: 13 (ref 5–15)
BUN: 11 mg/dL (ref 6–20)
CHLORIDE: 86 mmol/L — AB (ref 101–111)
CO2: 39 mmol/L — ABNORMAL HIGH (ref 22–32)
CREATININE: 0.71 mg/dL (ref 0.44–1.00)
Calcium: 8.7 mg/dL — ABNORMAL LOW (ref 8.9–10.3)
GFR calc Af Amer: >60 mL/min (ref >60)
GFR calc non Af Amer: >60 mL/min (ref >60)
Glucose, Bld: 134 mg/dL — ABNORMAL HIGH (ref 65–99)
Potassium: 3.4 mmol/L — ABNORMAL LOW (ref 3.5–5.1)
Sodium: 138 mmol/L (ref 135–145)

## 2017-12-08 LAB — GLUCOSE, CAPILLARY
GLUCOSE-CAPILLARY: 136 mg/dL — AB (ref 65–99)
Glucose-Capillary: 135 mg/dL — ABNORMAL HIGH (ref 65–99)
Glucose-Capillary: 203 mg/dL — ABNORMAL HIGH (ref 65–99)
Glucose-Capillary: 185 mg/dL — ABNORMAL HIGH (ref 65–99)

## 2017-12-08 LAB — MAGNESIUM: MAGNESIUM: 1.7 mg/dL (ref 1.7–2.4)

## 2017-12-08 MED ORDER — POTASSIUM CHLORIDE CRYS ER 20 MEQ PO TBCR
40.0000 meq | EXTENDED_RELEASE_TABLET | Freq: Two times a day (BID) | ORAL | Status: DC
  Administered 2017-12-08 – 2017-12-12 (×8): 40 meq via ORAL
  Filled 2017-12-08 (×8): qty 2

## 2017-12-08 MED ORDER — SIMETHICONE 80 MG PO CHEW: 80.0000 mg | CHEWABLE_TABLET | Freq: Four times a day (QID) | ORAL | Status: DC | PRN

## 2017-12-08 MED ORDER — TRAMADOL HCL 50 MG PO TABS
50.0000 mg | ORAL_TABLET | Freq: Four times a day (QID) | ORAL | Status: DC | PRN
  Administered 2017-12-08 – 2017-12-12 (×7): 50 mg via ORAL
  Filled 2017-12-08 (×7): qty 1

## 2017-12-08 MED ORDER — POTASSIUM CHLORIDE 20 MEQ/15ML (10%) PO SOLN
40.0000 meq | Freq: Two times a day (BID) | ORAL | Status: DC
  Administered 2017-12-08: 40 meq via ORAL
  Filled 2017-12-08 (×2): qty 30

## 2017-12-08 NOTE — Progress Notes (Signed)
Placed patient on BIPAP for the night via AVAPS with minimum EPAP set at 8cm and [place] volume set at 440.

## 2017-12-08 NOTE — Progress Notes (Signed)
   Subjective:  Feeling well this morning. Has worsened dyspnea when sitting up but feels well when at rest and laying flat. On home 3 L O2 via [place] this morning.  Objective:  Vital signs in last 24 hours: Vitals:   12/07/17 1941 12/07/17 2030 12/08/17 0500 12/08/17 0526  BP: 96/65   107/71  Pulse: 88   75  Resp: (!) 22   17  Temp: 98.1 F (36.7 C)   97.8 F (36.6 C)  TempSrc: Oral   Oral  SpO2: 91% 93%  95%  Weight:   (!) 349 lb 13.9 oz (158.7 kg)   Height:       Physical Exam: General: obese woman, resting in bed, NAD on 3 L [place] Cardiac: RRR, 2/6 systolic murmur Pulm: faint bibasilar crackles Abd: soft, nontender, obese abdomen Ext: minimal pedal edema Neuro: alert and oriented X3  Assessment/Plan: Heart failure with preserved ejection fracture OHS Dyspnea with exertion: Suspect right sided heart failure secondary to her obesity hypoventilation syndrome with chronic hypercapnia.  Net 11.9 L. Weights inconsistent, 349 lbs this morning. She is symptomatically improving and diuresing well however has continued orthopnea. Renal function stable. Will continue diuresis today. - TTE shows EF 65-70%, grade 1 DD, mod AS, mod LVH, dilated IVC - Continue diuresis with IV lasix 80 mg bid - likely needs higher home oral diuretic dosing (furosemide or torsemide) - BiPAP qhs.  - K 3.4, supplement 40 mEq BID - BMET in AM - Daily weights, Strict I/O - PT eval - Add IS  Hypertension: Continue home losartan 25 mg daily.  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 On Dulera BID, duonebs prn.  Hypothyroidism: Continue home Synthroid  DVT PPx: Lovenox  Dispo: Anticipated discharge in approximately 2-3 day(s).   Chelsea Finders, MD  Internal Medicine PGY-3 12/08/2017,  9:24 AM

## 2017-12-09 ENCOUNTER — Encounter (HOSPITAL_COMMUNITY): Payer: Self-pay | Admitting: Internal Medicine

## 2017-12-09 DIAGNOSIS — I350 Nonrheumatic aortic (valve) stenosis: Secondary | ICD-10-CM

## 2017-12-09 DIAGNOSIS — I5030 Unspecified diastolic (congestive) heart failure: Secondary | ICD-10-CM

## 2017-12-09 LAB — BASIC METABOLIC PANEL
ANION GAP: 13 (ref 5–15)
BUN: 18 mg/dL (ref 6–20)
CO2: 37 mmol/L — ABNORMAL HIGH (ref 22–32)
Calcium: 8.3 mg/dL — ABNORMAL LOW (ref 8.9–10.3)
Chloride: 85 mmol/L — ABNORMAL LOW (ref 101–111)
Creatinine, Ser: 0.8 mg/dL (ref 0.44–1.00)
GFR calc Af Amer: >60 mL/min (ref >60)
GLUCOSE: 142 mg/dL — AB (ref 65–99)
Potassium: 4 mmol/L (ref 3.5–5.1)
Sodium: 135 mmol/L (ref 135–145)

## 2017-12-09 LAB — GLUCOSE, CAPILLARY
Glucose-Capillary: 163 mg/dL — ABNORMAL HIGH (ref 65–99)
Glucose-Capillary: 199 mg/dL — ABNORMAL HIGH (ref 65–99)
Glucose-Capillary: 145 mg/dL — ABNORMAL HIGH (ref 65–99)
Glucose-Capillary: 181 mg/dL — ABNORMAL HIGH (ref 65–99)

## 2017-12-09 MED ORDER — FUROSEMIDE 10 MG/ML IJ SOLN
80.0000 mg | Freq: Once | INTRAMUSCULAR | Status: AC
  Administered 2017-12-09: 80 mg via INTRAVENOUS
  Filled 2017-12-09: qty 8

## 2017-12-09 NOTE — Progress Notes (Signed)
Patient not ready for BIPAP at this time. Patient will call when ready.

## 2017-12-09 NOTE — Progress Notes (Signed)
Medicine attending discharge note: I personally examined this patient on the day of planned discharge and I attest to the accuracy of the evaluation and management plan as detailed in the final progress note by resident physician Dr. Lars Mage.  52 year old woman with obesity sleep apnea on chronic BiPAP versus CPAP depending on which piece of equipment is working.  Recent  hospitalization February 17 - February 25 in another hospital to treat hypoxic and hypercarbic respiratory failure likely precipitated by pneumonia.  She required BiPAP but not intubation.  She is a type II diabetic on insulin.  She has hypertension and hypothyroidism.  Echocardiograms done over the last 7 years with variable findings and up to grade 2 diastolic dysfunction but normal right ventricular function.  She presented here on March 7 with progressive dyspnea on minimal exertion.  No fever.  Oxygen saturation fell as low as 78% when she fell asleep in the emergency department.  She required initial BiPAP.  A chest x-ray showed cardiomegaly with prominent pulmonary vessels and mild vascular congestion.  A BNP was 48.  Arterial blood gas with pH 7.36, PCO2 69, PO2 130 on BiPAP. She was treated with parenteral diuretics.  She improved symptomatically but spent the entire time in bed.  Her weight was recorded as 346 pounds on admission likely self-reported.  Next weight recorded was 363.5 pounds with peak weight recorded 367.5 pounds.  Weight at discharge 352 pounds and 11 ounces. A repeat echocardiogram done this admission with in my opinion, interesting findings.  She states that she has been normotensive for her entire life and is not on any antihypertensives.  The echo showed moderate left ventricular hypertrophy with increased ventricle wall thickness but preserved systolic function with estimated ejection fraction 65-70%.  Incoordinate septal motion.  Only grade 1 diastolic dysfunction.  Moderate aortic stenosis.  Right  ventricular wall thickness and systolic function was surprisingly normal.  No obvious elevation of pulmonary artery pressure but specific estimate not reported.  I would interpret this as meaning she has primarily diastolic dysfunction due to long-standing hypertensive changes.  However in the absence of hypertension, she may need to have further evaluation of her aortic stenosis.  Disposition: Condition stable at time of discharge Consider cardiology referral for left heart cath to evaluate aortic valve. There were no complications.

## 2017-12-09 NOTE — Progress Notes (Signed)
.     Subjective: Chelsea [name] was seen resting in her bed this morning doing well. She stated that she is having difficulty breathing and is worried about her breathing when she ambulates.  Objective:  Vital signs in last 24 hours: Vitals:   12/08/17 2009 12/08/17 2113 12/09/17 0312 12/09/17 0914  BP: 100/67  94/61   Pulse: 87  81   Resp: 20  20   Temp:   98.4 F (36.9 C)   TempSrc:   Oral   SpO2: 94% 94% 90% 92%  Weight:   (!) 352 lb 11.8 oz (160 kg)   Height:       Physical Exam  Constitutional: She appears well-developed and well-nourished. No distress.  HENT:  Head: Normocephalic and atraumatic.  Eyes: Conjunctivae are normal.  Cardiovascular: Normal rate, regular rhythm and normal heart sounds.  Respiratory: Effort normal and breath sounds normal. No respiratory distress. She has no wheezes.  GI: Soft. Bowel sounds are normal. She exhibits no distension. There is no tenderness.  Neurological: She is alert.  Skin: She is not diaphoretic.  Psychiatric: She has a normal mood and affect. Her behavior is normal. Judgment and thought content normal.   Assessment/Plan:  Heart failure with preserved ejection fracture OHS Dyspnea with exertion The patient put out 863ml over the past 24 hrs. The patient's weight this morning was 352 which is up from 346lb on admission. However, the patient's weights have been very inconsistent.   -Respiratory therapy consulted to place the patient on bipap -IV lasix 80mg  po -BiPAP qhs.  -k=4.0 today 12/09/17 - BMET in AM -Daily weights, Strict I/O - Add IS  Hypertension The patient's blood pressure has been ranging 91-100/60-67 over the past 24 hrs.   Continue home losartan 25 mg daily.  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 -Continue Levimir 20u qhs -SSI -CBG monitoring  -Carb modified diet -Add home Tramadol 50 mg q6h prn for  pain  Questionable COPD: Pulmonary notes from 2017 indicate that she does not have COPD. On Dulera BID, duonebs prn.  Hypothyroidism: Continue homeSynthroid  Dispo: Anticipated discharge in approximately 1-2 day(s). Pt eval pending imminent discharge.  Lars Mage, MD 12/09/2017, 11:20 AM Pager: [phone]

## 2017-12-09 NOTE — Progress Notes (Signed)
Patient on BIPAP, previous settings of  VT 440, EPAP 8, BUR 8. Weaned Fio2 to 40%

## 2017-12-09 NOTE — Evaluation (Signed)
Physical Therapy Evaluation Patient Details Name: Chelsea [name] MRN: [id number] DOB: [birth year] Today's Date: 12/09/2017   History of Present Illness  Pt is a 52 year old female with a past medical history significant for HFpEF, COPD on chronic 2L nasal cannula, obesity hypoventilation syndrome, diabetes mellitus type 2, hypertension, hyperlipidemia, and chronic lymphedema.  She presented to the emergency room  with complaints of dyspnea with exertion.  She was just admitted to a hospital in [place] between February 17 and February 25 for hypoxic and hypercarbic respiratory failure precipitated by pneumonia requiring BiPAP but not intubation.     Clinical Impression  Pt admitted with above diagnosis. Pt currently with functional limitations due to the deficits listed below (see PT Problem List). PTA pt lived at home alone. She was modified independent with functional mobility using a cane for ambulation. She has 4 steps with rails to enter her home. On eval, she required min assist bed mobility, min guard assist transfers, and min guard assist ambulation 15 feet with RW. Pt on 3 L O2 throughout session with desat to 85% during ambulation. Pt will benefit from skilled PT to increase their independence and safety with mobility to allow discharge to the venue listed below.       Follow Up Recommendations SNF    Equipment Recommendations  Other (comment)(bariatric rollator)    Recommendations for Other Services       Precautions / Restrictions Precautions Precautions: Fall;Other (comment) Precaution Comments: watch sats      Mobility  Bed Mobility Overal bed mobility: Needs Assistance Bed Mobility: Supine to Sit     Supine to sit: Min assist;HOB elevated     General bed mobility comments: +rail, increased time and effort  Transfers Overall transfer level: Needs assistance Equipment used: Rolling walker (2 wheeled) Transfers: Sit to/from [facility] Sit  to Stand: Min guard Stand pivot transfers: Min guard       General transfer comment: increased time and effort, increased time to stabilize initial standing balance  Ambulation/Gait Ambulation/Gait assistance: Min guard Ambulation Distance (Feet): 15 Feet Assistive device: Rolling walker (2 wheeled) Gait Pattern/deviations: Step-through pattern;Decreased stride length;Wide base of support Gait velocity: decreased Gait velocity interpretation: Below normal speed for age/gender General Gait Details: Pt on 3 L O2 throughout session. SpO2 92% at rest with desat to 85% during ambulation. Returned to 90% after 2-minute recovery.  Stairs            Wheelchair Mobility    Modified Rankin (Stroke Patients Only)       Balance Overall balance assessment: Needs assistance Sitting-balance support: No upper extremity supported;Feet supported Sitting balance-Leahy Scale: Good     Standing balance support: Bilateral upper extremity supported;During functional activity Standing balance-Leahy Scale: Poor Standing balance comment: heavy reliance on RW                             Pertinent Vitals/Pain Pain Assessment: 0-10 Pain Score: 6  Pain Location: BLE, low back Pain Descriptors / Indicators: Aching;Sore Pain Intervention(s): Limited activity within patient's tolerance;Monitored during session;Premedicated before session    [place] expects to be discharged to:: Private residence Living Arrangements: Alone Available Help at Discharge: Available PRN/intermittently;Family Type of Home: Mobile home Home Access: Stairs to enter Entrance Stairs-Rails: Psychiatric nurse of Steps: 4 Home Layout: One level Home Equipment: Shower seat;Cane - single point;Grab bars - tub/shower;Other (comment)(home O2 (2-3 L continuous))  Prior Function Level of Independence: Independent with assistive device(s)         Comments: ambulates with  cane. Drives. Uses motorized cart in grocery store.     Hand Dominance   Dominant Hand: Right    Extremity/Trunk Assessment   Upper Extremity Assessment Upper Extremity Assessment: Generalized weakness    Lower Extremity Assessment Lower Extremity Assessment: Generalized weakness    Cervical / Trunk Assessment Cervical / Trunk Assessment: Normal  Communication   Communication: No difficulties  Cognition Arousal/Alertness: Awake/alert Behavior During Therapy: WFL for tasks assessed/performed Overall Cognitive Status: Within Functional Limits for tasks assessed                                        General Comments      Exercises     Assessment/Plan    PT Assessment Patient needs continued PT services  PT Problem List Decreased strength;Decreased mobility;Decreased activity tolerance;Cardiopulmonary status limiting activity;Pain;Decreased balance;Decreased knowledge of use of DME       PT Treatment Interventions DME instruction;Therapeutic activities;Gait training;Therapeutic exercise;Patient/family education;Balance training;Stair training;Functional mobility training    PT Goals (Current goals can be found in the Care Plan section)  Acute Rehab PT Goals Patient Stated Goal: get stronger before going home PT Goal Formulation: With patient Time For Goal Achievement: 12/23/17 Potential to Achieve Goals: Good    Frequency Min 3X/week   Barriers to discharge Decreased caregiver support      Co-evaluation               AM-PAC PT "6 Clicks" Daily Activity  Outcome Measure Difficulty turning over in bed (including adjusting bedclothes, sheets and blankets)?: A Lot Difficulty moving from lying on back to sitting on the side of the bed? : Unable Difficulty sitting down on and standing up from a chair with arms (e.g., wheelchair, bedside commode, etc,.)?: A Lot Help needed moving to and from a bed to chair (including a wheelchair)?: A  Little Help needed walking in hospital room?: A Little Help needed climbing 3-5 steps with a railing? : A Lot 6 Click Score: 13    End of Session Equipment Utilized During Treatment: Gait belt;Oxygen Activity Tolerance: Patient tolerated treatment well Patient left: in bed;with call bell/phone within reach(sitting EOB) Nurse Communication: Mobility status PT Visit Diagnosis: Muscle weakness (generalized) (M62.81);Difficulty in walking, not elsewhere classified (R26.2)    Time: 6389-3734 PT Time Calculation (min) (ACUTE ONLY): 32 min   Charges:   PT Evaluation $PT Eval Moderate Complexity: 1 Mod PT Treatments $Gait Training: 8-22 mins   PT G Codes:        Lorrin Goodell, PT  Office # [phone] Pager [phone]   Lorriane Shire 12/09/2017, 12:15 PM

## 2017-12-09 NOTE — Progress Notes (Signed)
Patient not ready for BIPAP. Request RT to come back at midnight.

## 2017-12-09 NOTE — Progress Notes (Signed)
12/09/2017 1700 Pt walked with cane from bed to door on 3L.  Sats went as low as 84%.  Pt placed back in bed.  Sats came back up to 94%.  Call bell in reach. [place]

## 2017-12-10 DIAGNOSIS — J9621 Acute and chronic respiratory failure with hypoxia: Secondary | ICD-10-CM

## 2017-12-10 DIAGNOSIS — I10 Essential (primary) hypertension: Secondary | ICD-10-CM

## 2017-12-10 DIAGNOSIS — E1140 Type 2 diabetes mellitus with diabetic neuropathy, unspecified: Secondary | ICD-10-CM

## 2017-12-10 DIAGNOSIS — J9622 Acute and chronic respiratory failure with hypercapnia: Secondary | ICD-10-CM

## 2017-12-10 LAB — BASIC METABOLIC PANEL
Anion gap: 13 (ref 5–15)
BUN: 23 mg/dL — AB (ref 6–20)
CALCIUM: 8.4 mg/dL — AB (ref 8.9–10.3)
CO2: 33 mmol/L — ABNORMAL HIGH (ref 22–32)
CREATININE: 0.78 mg/dL (ref 0.44–1.00)
Chloride: 90 mmol/L — ABNORMAL LOW (ref 101–111)
GFR calc non Af Amer: >60 mL/min (ref >60)
Glucose, Bld: 160 mg/dL — ABNORMAL HIGH (ref 65–99)
Potassium: 3.9 mmol/L (ref 3.5–5.1)
SODIUM: 136 mmol/L (ref 135–145)

## 2017-12-10 LAB — GLUCOSE, CAPILLARY
GLUCOSE-CAPILLARY: 210 mg/dL — AB (ref 65–99)
Glucose-Capillary: 150 mg/dL — ABNORMAL HIGH (ref 65–99)
Glucose-Capillary: 140 mg/dL — ABNORMAL HIGH (ref 65–99)
Glucose-Capillary: 174 mg/dL — ABNORMAL HIGH (ref 65–99)

## 2017-12-10 MED ORDER — INSULIN DETEMIR 100 UNIT/ML ~~LOC~~ SOLN
25.0000 [IU] | Freq: Every day | SUBCUTANEOUS | Status: DC
  Administered 2017-12-10 – 2017-12-11 (×2): 25 [IU] via SUBCUTANEOUS
  Filled 2017-12-10 (×3): qty 0.25

## 2017-12-10 MED ORDER — FUROSEMIDE 40 MG PO TABS
60.0000 mg | ORAL_TABLET | Freq: Two times a day (BID) | ORAL | Status: DC
  Administered 2017-12-10 – 2017-12-12 (×5): 60 mg via ORAL
  Filled 2017-12-10 (×5): qty 1

## 2017-12-10 MED ORDER — FUROSEMIDE 80 MG PO TABS: 80.0000 mg | ORAL_TABLET | Freq: Two times a day (BID) | ORAL | 1 refills | Status: DC

## 2017-12-10 NOTE — NC FL2 (Signed)
[place] LEVEL OF CARE SCREENING TOOL     IDENTIFICATION  Patient Name: Chelsea [name] Birthdate: May 27, 1966 Sex: female Admission Date (Current Location): 12/05/2017  [facility] and [place] Number:  Herbalist and Address:  [facility] [place], [place] [address], [place], [place] [address]      Provider Number: [id number]  Attending Physician Name and Address:  Annia Belt, MD  Relative Name and Phone Number:  Nadine Counts, [phone]    Current Level of Care: Hospital Recommended Level of Care: [place] Prior Approval Number:    Date Approved/Denied:   PASRR Number: [id number]  Discharge Plan: SNF    Current Diagnoses: Patient Active Problem List   Diagnosis Date Noted  . Dyspnea 12/05/2017  . Bronchitis 05/30/2011  . Cellulitis, leg 04/29/2011  . Neuropathic pain, leg 04/09/2011  . Edema 03/22/2011  . Morbid obesity ([place]) 03/22/2011  . History of cervical cancer 03/22/2011  . Aortic stenosis 03/14/2011  . Ankle pain, left 03/14/2011  . Obesity hypoventilation syndrome ([place]) 01/01/2011  . Acute on chronic diastolic heart failure ([place]) 11/28/2010  . DEGENERATIVE JOINT DISEASE 11/28/2010  . Chronic diastolic heart failure ([place]) 11/14/2010  . UNSPECIFIED HYPOTHYROIDISM 11/13/2010  . UNSPEC COMBINED SYSTOLIC&DIASTOLIC HEART FAILURE 85/88/5027  . HYPERTHYROIDISM 11/10/2010  . DM 11/10/2010  . Obstructive sleep apnea 11/10/2010  . Essential hypertension 11/10/2010  . GERD 11/10/2010    Orientation RESPIRATION BLADDER Height & Weight     Self, Time, Situation  O2(6l) Incontinent Weight: (!) 352 lb 15.3 oz (160.1 kg) Height:  5\' 5"  (165.1 cm)  BEHAVIORAL SYMPTOMS/MOOD NEUROLOGICAL BOWEL NUTRITION STATUS      Continent Diet(heart healthy/carb modified)  AMBULATORY STATUS COMMUNICATION OF NEEDS Skin   Limited Assist Verbally                         Personal Care Assistance Level of  Assistance  Bathing, Feeding, Dressing Bathing Assistance: Limited assistance Feeding assistance: Independent Dressing Assistance: Limited assistance     Functional Limitations Info  Sight, Hearing, Speech Sight Info: Adequate Hearing Info: Adequate Speech Info: Adequate    SPECIAL CARE FACTORS FREQUENCY  OT (By licensed OT), PT (By licensed PT)     PT Frequency: 5x wk OT Frequency: 5x wk            Contractures Contractures Info: Not present    Additional Factors Info  Code Status, Allergies Code Status Info: Full Code Allergies Info: PENICILLINS, MINOXIDIL           Current Medications (12/10/2017):  This is the current hospital active medication list Current Facility-Administered Medications  Medication Dose Route Frequency Provider Last Rate Last Dose  . 0.9 %  sodium chloride infusion  250 mL Intravenous PRN Maryellen Pile, MD      . acetaminophen (TYLENOL) tablet 650 mg  650 mg Oral Q6H PRN Maryellen Pile, MD   650 mg at 12/08/17 1603   Or  . acetaminophen (TYLENOL) suppository 650 mg  650 mg Rectal Q6H PRN Maryellen Pile, MD      . alum & mag hydroxide-simeth (MAALOX/MYLANTA) 200-200-20 MG/5ML suspension 30 mL  30 mL Oral Q4H PRN Annia Belt, MD   30 mL at 12/06/17 2201  . aspirin EC tablet 81 mg  81 mg Oral Daily Maryellen Pile, MD   81 mg at 12/10/17 0950  . enoxaparin (LOVENOX) injection 80 mg  80 mg Subcutaneous Q24H Murriel Hopper  M, MD   80 mg at 12/09/17 2049  . ferrous sulfate tablet 325 mg  325 mg Oral BID Maryellen Pile, MD   325 mg at 12/10/17 0950  . furosemide (LASIX) tablet 60 mg  60 mg Oral BID Molt, Bethany, DO   60 mg at 12/10/17 1034  . gabapentin (NEURONTIN) capsule 300 mg  300 mg Oral BID Maryellen Pile, MD   300 mg at 12/10/17 0950  . insulin aspart (novoLOG) injection 0-9 Units  0-9 Units Subcutaneous TID WC Maryellen Pile, MD   3 Units at 12/10/17 1443  . insulin detemir (LEVEMIR) injection 20 Units  20 Units  Subcutaneous QHS Maryellen Pile, MD   20 Units at 12/09/17 2136  . ipratropium-albuterol (DUONEB) 0.5-2.5 (3) MG/3ML nebulizer solution 3 mL  3 mL Nebulization Q4H PRN Maryellen Pile, MD      . levothyroxine (SYNTHROID, LEVOTHROID) tablet 150 mcg  150 mcg Oral QAC breakfast Maryellen Pile, MD   150 mcg at 12/10/17 0612  . losartan (COZAAR) tablet 25 mg  25 mg Oral Daily Maryellen Pile, MD   25 mg at 12/10/17 0950  . mometasone-formoterol (DULERA) 100-5 MCG/ACT inhaler 2 puff  2 puff Inhalation BID Maryellen Pile, MD   2 puff at 12/10/17 0953  . potassium chloride SA (K-DUR,KLOR-CON) CR tablet 40 mEq  40 mEq Oral BID Joni Reining C, DO   40 mEq at 12/10/17 0950  . simethicone (MYLICON) chewable tablet 80 mg  80 mg Oral QID PRN Zada Finders, MD      . sodium chloride flush (NS) 0.9 % injection 3 mL  3 mL Intravenous Q12H Maryellen Pile, MD   3 mL at 12/10/17 0951  . sodium chloride flush (NS) 0.9 % injection 3 mL  3 mL Intravenous PRN Maryellen Pile, MD   3 mL at 12/06/17 0823  . traMADol (ULTRAM) tablet 50 mg  50 mg Oral Q6H PRN Zada Finders, MD   50 mg at 12/10/17 0436     Discharge Medications: Please see discharge summary for a list of discharge medications.  Relevant Imaging Results:  Relevant Lab Results:   Additional Information SS# [id number] pt weighs 352lb  Wende Neighbors, LCSW

## 2017-12-10 NOTE — Progress Notes (Signed)
   Subjective:  Chelsea [name] was seen resting in her bed this morning. She states that she is continuing to have sob when she walks around.   Objective:  Vital signs in last 24 hours: Vitals:   12/09/17 2042 12/10/17 0004 12/10/17 0330 12/10/17 0435  BP: 106/75   115/71  Pulse: 92 86 75 73  Resp: 15 18 19 17   Temp: 98.9 F (37.2 C)   (!) 97.1 F (36.2 C)  TempSrc: Oral   Oral  SpO2: 94% 98% 95% 97%  Weight:    (!) 352 lb 15.3 oz (160.1 kg)  Height:       Physical Exam  Constitutional: She appears well-developed and well-nourished. No distress.  Morbid obesity  HENT:  Head: Normocephalic and atraumatic.  Eyes: Conjunctivae are normal.  Cardiovascular: Normal rate, regular rhythm and normal heart sounds.  Respiratory: Effort normal and breath sounds normal. No respiratory distress. She has no wheezes.  GI: Soft. Bowel sounds are normal. She exhibits no distension. There is no tenderness.  Musculoskeletal: She exhibits no edema.  Neurological: She is alert.  Skin: She is not diaphoretic. No erythema.  Psychiatric: She has a normal mood and affect. Her behavior is normal. Judgment and thought content normal.    Assessment/Plan:  Acute on chronic hypoxic and hypercapnic respiratory failure: Multifactorial with recent hypoventilation, heart failure with preserved ejection fracture, OHS.  Her echo only showed moderate LV hypertrophy with increased ventricle wall thickness but preserved systolic function with an EF 65-70%.  Surprisingly no obvious elevated PA pressures but no specific event was recorded.  Aortic stenosis.  She has been diuresed with Lasix 80 mg oral twice daily.  She has had 3.2 L output yesterday with a net -1.7 L.  Weight today is 352 pounds and stable.  We will decrease her Lasix dose to 60 mg twice daily.  Continue BiPAP nightly.  She is on chronic 2 L oxygen at home and is currently requiring 3 L.  Will tend to wean as able to her home dose.  Yesterday she did  desat to 84% while walking supplemental oxygen.  Overall we have she has diuresed well and back to her normal baseline.  She is stable for discharge to skilled nursing facility.  Hypertension Measures have been well controlled we will continue home losartan.  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  -Increase Levimir 20u qhs > 25 units nightly -SSI -CBG monitoring  -Carb modified diet -Add home Tramadol 50 mg q6h prn for pain  QuestionableCOPD: Pulmonary notes from 2017 indicate that she does not have COPD. On Dulera BID, duonebs prn.  Hypothyroidism: Continue homeSynthroid  DVT PPx: Lovenox 80 mg subQ daily  Dispo: Anticipated discharge in approximately 1 day(s).   Chelsea Pile, MD 12/10/2017, 3:13 PM Pager: [phone]

## 2017-12-10 NOTE — Discharge Summary (Signed)
Name: Chelsea [name] MRN: [id number] DOB: 02/08/1966 52 y.o. PCP: Barnie Mort, NP  Date of Admission: 12/05/2017  3:31 AM Date of Discharge: 12/12/17 Attending Physician: Dr. Beryle Beams  Discharge Diagnosis:  Principal Problem:   Acute on chronic diastolic heart failure ([place])  Obesity hypoventilation syndrome ([place])  Active Problems:   Essential hypertension     Morbid obesity ([facility])   Neuropathic pain, leg   Dyspnea  Discharge Medications: Allergies as of 12/12/2017      Reactions   Penicillins Anaphylaxis   Patient tolerates cefepime (08/2017)   Minoxidil       Medication List    TAKE these medications   albuterol 108 (90 Base) MCG/ACT inhaler Commonly known as:  VENTOLIN HFA Inhale 2 puffs into the lungs every 6 (six) hours as needed for wheezing.   albuterol (2.5 MG/3ML) 0.083% nebulizer solution Commonly known as:  PROVENTIL Take 3 mLs (2.5 mg total) by nebulization every 6 (six) hours as needed for wheezing or shortness of breath (I50.32).   aspirin 81 MG tablet Take 81 mg by mouth daily.   ferrous sulfate 325 (65 FE) MG tablet Take 325 mg by mouth 2 (two) times daily. On Hold   furosemide 80 MG tablet Commonly known as:  LASIX Take 1 tablet (80 mg total) by mouth 2 (two) times daily. What changed:    medication strength  how much to take  Another medication with the same name was removed. Continue taking this medication, and follow the directions you see here.   gabapentin 300 MG capsule Commonly known as:  NEURONTIN Take 300 mg by mouth 2 (two) times daily.   glucosamine-chondroitin 500-400 MG tablet Take 1 tablet by mouth 2 (two) times daily.   insulin detemir 100 UNIT/ML injection Commonly known as:  LEVEMIR Inject 60 Units into the skin at bedtime. As directed   levothyroxine 150 MCG tablet Commonly known as:  SYNTHROID, LEVOTHROID TAKE 1 TABLET EVERY DAY   losartan 25 MG tablet Commonly known as:  COZAAR Take 25 mg by mouth  daily.   multivitamin tablet Take 1 tablet by mouth daily.   NON FORMULARY Oxygen 3 liters and cpap   TRADJENTA 5 MG Tabs tablet Generic drug:  linagliptin Take 5 mg by mouth daily.   traMADol 50 MG tablet Commonly known as:  ULTRAM Take 50 mg by mouth every 6 (six) hours as needed for moderate pain.       Disposition and follow-up:   Chelsea [name] was discharged from [facility] in stable condition.  At the hospital follow up visit please address:  1.  Acute on chronic respiratory failure: please make sure the patient is on 3L via nasal cannula  2.  Labs / imaging needed at time of follow-up: none  3.  Pending labs/ test needing follow-up: none  Follow-up Appointments: Follow-up Information    Care, [facility] Follow up.   Specialty:  [place] Why:  [facility] services resumed- RN/PT/OT Contact information: [place] [address] [phone]        Barnie Mort, NP Follow up in 1 week(s).   Specialty:  Nurse Practitioner Contact information: [place] [address] [place] Hospital Course by problem list:   Acute on chronic respiratory failure: The patient presented with 1 week history of dyspnea on exertion following her recent hospitalization. thep atient did not have any  pulmonary infiltrate, effusion, or consolidation noted on chest xray. BNP=48, abg showed ph=7.36, pcot=69, po2=130 while on bipap. The patient did not have fever or leukocytosis.  The patient was hospitalized at [facility] medical center from 11/17/17-11/25/17 with acute on chronic respiratory failure with hypoxia and hypercarbia. She was treated during that hospitalization with bipap, antibiotics and was discharged with an increased oxygen requirement of 3-4L.   The patient's dyspnea is thought to be multifactorial from ohs, valvular heart disease, diastolic heart failure, and osa. The patient had an echo done  during the hospitalization which showed lvef 65-70%, g1dd, moderate aortic stenosis, and moderate LVH. During the patient's hospitalization she was treated with bipap, iv diuresis, and given oxygen via nasal cannula. The patient diuresed out total of 15L and her weight on day of discharge was 350lb. She was sent home with home health pt, ot, repiratory care, and rn services.    Discharge Vitals:   BP 105/82 (BP Location: Left Arm)   Pulse 88   Temp 98.6 F (37 C) (Axillary)   Resp (!) 23   Ht 5\' 5"  (1.651 m)   Wt (!) 350 lb 1.5 oz (158.8 kg)   SpO2 98%   BMI 58.26 kg/m   Pertinent Labs, Studies, and Procedures:  CBC    Component Value Date/Time   WBC 9.1 12/04/2017 1941   RBC 4.24 12/04/2017 1941   HGB 12.5 12/04/2017 1941   HCT 41.0 12/04/2017 1941   PLT 194 12/04/2017 1941   MCV 96.7 12/04/2017 1941   MCH 29.5 12/04/2017 1941   MCHC 30.5 12/04/2017 1941   RDW 13.3 12/04/2017 1941   LYMPHSABS 2.7 08/15/2012 1227   MONOABS 0.6 08/15/2012 1227   EOSABS 0.2 08/15/2012 1227   BASOSABS 0.0 08/15/2012 1227   BMP Latest Ref Rng & Units 12/10/2017 12/09/2017 12/08/2017  Glucose 65 - 99 mg/dL 160(H) 142(H) 134(H)  BUN 6 - 20 mg/dL 23(H) 18 11  Creatinine 0.44 - 1.00 mg/dL 0.78 0.80 0.71  Sodium 135 - 145 mmol/L 136 135 138  Potassium 3.5 - 5.1 mmol/L 3.9 4.0 3.4(L)  Chloride 101 - 111 mmol/L 90(L) 85(L) 86(L)  CO2 22 - 32 mmol/L 33(H) 37(H) 39(H)  Calcium 8.9 - 10.3 mg/dL 8.4(L) 8.3(L) 8.7(L)   Chest xray (12/04/17): 1. Cardiomegaly with vascular congestion and mild diffuse interstitial opacities suggesting minimal edema. Possible tiny left effusion 2. Enlarged appearing pulmonary hila, query pulmonary hypertension.  Discharge Instructions: Discharge Instructions    Call MD for:  difficulty breathing, headache or visual disturbances   Complete by:  As directed    Call MD for:  extreme fatigue   Complete by:  As directed    Call MD for:  persistant dizziness or  light-headedness   Complete by:  As directed    Call MD for:  redness, tenderness, or signs of infection (pain, swelling, redness, odor or green/yellow discharge around incision site)   Complete by:  As directed    Call MD for:  severe uncontrolled pain   Complete by:  As directed    Diet - low sodium heart healthy   Complete by:  As directed    Increase activity slowly   Complete by:  As directed       Signed: Lars Mage, MD 12/13/2017, 10:43 AM   Pager: [phone]

## 2017-12-10 NOTE — Progress Notes (Signed)
Medicine attending:  I personally examined this patient on the day of discharge and I attest to the accuracy of the discharge evaluation and plan as recorded in the final progress note by resident physician Dr. Lars Mage and Maryellen Pile.  Discharge originally planned for yesterday but delayed when oxygen saturation fell on ambulation.  Please see my discharge summary note dictated March 11.  I believe the patient will have chronic hypoxia in view of limited physiologic reserve when she ambulates and that we will not be able to improve upon this practically speaking without a pulmonary rehab program and a significant amount of weight loss.  I reinforced to her that she needs to wear her oxygen continuously and use the CPAP every night.  She was evaluated by our physical therapy team and felt to benefit by a short-term placement in a skilled nursing facility where she can get skilled physical therapy to increase her independence and safety with mobility.  The patient will be discharged as soon as a suitable bed is arranged.

## 2017-12-10 NOTE — Clinical Social Work Note (Signed)
Clinical Social Work Assessment  Patient Details  Name: Chelsea [name] MRN: [id number] Date of Birth: [birth year]  Date of referral:  12/10/17               Reason for consult:  Discharge Planning, Facility Placement                Permission sought to share information with:  Family Supports Permission granted to share information::  Yes, Verbal Permission Granted  Name::     Pricilla Riffle  Agency::  snf  Relationship::  significant other  Contact Information:  [phone]  Housing/Transportation Living arrangements for the past 2 months:  [place] of Information:  Patient Patient Interpreter Needed:  None Criminal Activity/Legal Involvement Pertinent to Current Situation/Hospitalization:  No - Comment as needed Significant Relationships:  Adult Children, Other Family Members, Significant Other Lives with:  Significant Other(pt stated that boyfriend lives with her but he travels a lot) Do you feel safe going back to the place where you live?  Yes Need for family participation in patient care:  Yes (Comment)  Care giving concerns:  No family at bedside. Patient stated her boyfriend lives with her but stated her boyfriend travels for work 10 months out of 1. Patient stated she has adult kids but stated they are busy raising her 44 grandchildren  Facilities manager / plan: CSW met patient at bedside to discuss disposition plan. Patient stated she is agreeable to discharge to a rehab facility so she can get her strength back up. CSW went over the process of SNF and stated to patient that on her facesheet it states her primary insurance is Medicaid. CSW explained to patient that if Medicaid is primary insurance, she would have to stay at facility for no less than 30 days and she would also have to give up her disability check to pay for rehab stay. Patient stated she is unable to give up her check because she uses that money to pay her bills. Patient stated she has  [employer] as primary  not Medicaid. CSW stated that if [place] is primary insurance then patent will not have to release check to facility. Patient was agreeable to have CSW fax her out and patient will call insurance company to verify coverage.   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 Patient stated her goal is to be able to be healthy enough to travel internationally with boyfriend.   Patient/Family's Understanding of and Emotional Response to Diagnosis, Current Treatment, and Prognosis:  Patient wanting rehab.  Emotional Assessment Appearance:  Appears stated age Attitude/Demeanor/Rapport:  Engaged Affect (typically observed):  Accepting, Pleasant Orientation:  Oriented to Self, Oriented to Place, Oriented to  Time, Oriented to Situation Alcohol / Substance use:  Not Applicable Psych involvement (Current and /or in the community):  No (Comment)  Discharge Needs  Concerns to be addressed:  No discharge needs identified Readmission within the last 30 days:  No Current discharge risk:  None Barriers to Discharge:  No Barriers Identified   Wende Neighbors, LCSW 12/10/2017, 2:03 PM

## 2017-12-11 LAB — GLUCOSE, CAPILLARY
GLUCOSE-CAPILLARY: 141 mg/dL — AB (ref 65–99)
GLUCOSE-CAPILLARY: 159 mg/dL — AB (ref 65–99)
GLUCOSE-CAPILLARY: 185 mg/dL — AB (ref 65–99)
GLUCOSE-CAPILLARY: 143 mg/dL — AB (ref 65–99)

## 2017-12-11 NOTE — Progress Notes (Signed)
Medicine attending: I examined this patient today together with resident physician Dr. Lars Mage and I concur with her evaluation and management plan. Patient is symptomatically improved.  Exam stable. Oxygenation 94% on 3 L nasal cannula. We are finalizing discharge plans today.  She may not qualify for a rehab facility.  We are looking into availability of home services.

## 2017-12-11 NOTE — Progress Notes (Signed)
   Subjective: Chelsea [name] was seen resting in her sitting up in her bed this morning eating breakfast. She states that her breathing has improved and she has been able to move around the room. However, when she is moving around the room she is getting short of breath.   Objective:  Vital signs in last 24 hours: Vitals:   12/11/17 0406 12/11/17 0425 12/11/17 0633 12/11/17 0838  BP: 132/77     Pulse: 76 70    Resp: 13 14    Temp:      TempSrc:   Oral   SpO2: 93% 93%  94%  Weight:   (!) 350 lb 15.6 oz (159.2 kg)   Height:       Physical Exam  Constitutional: She appears well-developed and well-nourished. No distress.  HENT:  Head: Normocephalic and atraumatic.  Eyes: Conjunctivae are normal.  Cardiovascular: Normal rate, regular rhythm and normal heart sounds.  Respiratory: Effort normal and breath sounds normal. No respiratory distress. She has no wheezes.  GI: Soft. Bowel sounds are normal. She exhibits no distension. There is no tenderness.  Musculoskeletal: She exhibits no edema.  Neurological: She is alert.  Skin: She is not diaphoretic.  Psychiatric: She has a normal mood and affect. Her behavior is normal. Judgment and thought content normal.   Assessment/Plan:  Acute on chronic hypoxic and hypercapnic respiratory failure: The patient is currently saturating well on 3L via nasal cannula.  The patient's respiratory failure is likely multifactorial from HFpEF, OHS, and valvular dysfunction (aortic stenosis).   -Continue bipap nightly -Continue lasix 60mg  bid -Continue duonebs q4hrs prn -Continue dulera 2puffs bid  Hypertension The patient's blood pressure has been well controlled ranging 100-132/67-77 over the past 24 hrs.   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  -Continue levemir 25u qhs -SSI -CBG monitoring  -Carb modified diet -continue  Tramadol 50 mg q6h prn for pain  QuestionableCOPD: Pulmonary notes from 2017 indicate that she does not have COPD. On Dulera BID, duonebs prn.  Hypothyroidism: Continue homeSynthroid  DVT PPx: Lovenox 80 mg subQ daily   Dispo: Anticipated discharge in approximately 0-2 day(s). The patient is stable for discharge to SNF. Awaiting placement.  Lars Mage, MD 12/11/2017, 9:30 AM Pager: [phone]

## 2017-12-11 NOTE — NC FL2 (Signed)
[place] LEVEL OF CARE SCREENING TOOL     IDENTIFICATION  Patient Name: Chelsea [name] Birthdate: 12/13/1965 Sex: female Admission Date (Current Location): 12/05/2017  [facility] and [place] Number:  Herbalist and Address:  [facility], [place] [address] [place], [place] [address]      Provider Number: [id number]  Attending Physician Name and Address:  Annia Belt, MD  Relative Name and Phone Number:  Nadine Counts, [phone]    Current Level of Care: Hospital Recommended Level of Care: [place] Prior Approval Number:    Date Approved/Denied:   PASRR Number: [id number]  Discharge Plan: SNF    Current Diagnoses: Patient Active Problem List   Diagnosis Date Noted  . Dyspnea 12/05/2017  . Bronchitis 05/30/2011  . Cellulitis, leg 04/29/2011  . Neuropathic pain, leg 04/09/2011  . Edema 03/22/2011  . Morbid obesity ([place]) 03/22/2011  . History of cervical cancer 03/22/2011  . Aortic stenosis 03/14/2011  . Ankle pain, left 03/14/2011  . Obesity hypoventilation syndrome ([place]) 01/01/2011  . Acute on chronic diastolic heart failure ([place]) 11/28/2010  . DEGENERATIVE JOINT DISEASE 11/28/2010  . Chronic diastolic heart failure ([place]) 11/14/2010  . UNSPECIFIED HYPOTHYROIDISM 11/13/2010  . UNSPEC COMBINED SYSTOLIC&DIASTOLIC HEART FAILURE 36/64/4034  . HYPERTHYROIDISM 11/10/2010  . DM 11/10/2010  . Obstructive sleep apnea 11/10/2010  . Essential hypertension 11/10/2010  . GERD 11/10/2010    Orientation RESPIRATION BLADDER Height & Weight     Self, Time, Situation  O2(6l) Incontinent Weight: (!) 350 lb 15.6 oz (159.2 kg) Height:  5\' 5"  (165.1 cm)  BEHAVIORAL SYMPTOMS/MOOD NEUROLOGICAL BOWEL NUTRITION STATUS      Continent Diet(heart healthy/carb modified)  AMBULATORY STATUS COMMUNICATION OF NEEDS Skin   Limited Assist Verbally                         Personal Care Assistance Level of  Assistance  Bathing, Feeding, Dressing Bathing Assistance: Limited assistance Feeding assistance: Independent Dressing Assistance: Limited assistance     Functional Limitations Info  Sight, Hearing, Speech Sight Info: Adequate Hearing Info: Adequate Speech Info: Adequate    SPECIAL CARE FACTORS FREQUENCY  OT (By licensed OT), PT (By licensed PT)     PT Frequency: 5x wk OT Frequency: 5x wk            Contractures Contractures Info: Not present    Additional Factors Info  Code Status, Allergies Code Status Info: Full Code Allergies Info: PENICILLINS, MINOXIDIL           Current Medications (12/11/2017):  This is the current hospital active medication list Current Facility-Administered Medications  Medication Dose Route Frequency Provider Last Rate Last Dose  . 0.9 %  sodium chloride infusion  250 mL Intravenous PRN Maryellen Pile, MD      . acetaminophen (TYLENOL) tablet 650 mg  650 mg Oral Q6H PRN Maryellen Pile, MD   650 mg at 12/08/17 1603   Or  . acetaminophen (TYLENOL) suppository 650 mg  650 mg Rectal Q6H PRN Maryellen Pile, MD      . alum & mag hydroxide-simeth (MAALOX/MYLANTA) 200-200-20 MG/5ML suspension 30 mL  30 mL Oral Q4H PRN Annia Belt, MD   30 mL at 12/06/17 2201  . aspirin EC tablet 81 mg  81 mg Oral Daily Maryellen Pile, MD   81 mg at 12/10/17 0950  . enoxaparin (LOVENOX) injection 80 mg  80 mg Subcutaneous Q24H Murriel Hopper  M, MD   80 mg at 12/10/17 2143  . ferrous sulfate tablet 325 mg  325 mg Oral BID Maryellen Pile, MD   325 mg at 12/10/17 2143  . furosemide (LASIX) tablet 60 mg  60 mg Oral BID Molt, Bethany, DO   60 mg at 12/11/17 0853  . gabapentin (NEURONTIN) capsule 300 mg  300 mg Oral BID Maryellen Pile, MD   300 mg at 12/10/17 2143  . insulin aspart (novoLOG) injection 0-9 Units  0-9 Units Subcutaneous TID WC Maryellen Pile, MD   1 Units at 12/11/17 0636  . insulin detemir (LEVEMIR) injection 25 Units  25 Units  Subcutaneous QHS Maryellen Pile, MD   25 Units at 12/10/17 2144  . ipratropium-albuterol (DUONEB) 0.5-2.5 (3) MG/3ML nebulizer solution 3 mL  3 mL Nebulization Q4H PRN Maryellen Pile, MD      . levothyroxine (SYNTHROID, LEVOTHROID) tablet 150 mcg  150 mcg Oral QAC breakfast Maryellen Pile, MD   150 mcg at 12/11/17 9169  . losartan (COZAAR) tablet 25 mg  25 mg Oral Daily Maryellen Pile, MD   25 mg at 12/10/17 0950  . mometasone-formoterol (DULERA) 100-5 MCG/ACT inhaler 2 puff  2 puff Inhalation BID Maryellen Pile, MD   2 puff at 12/11/17 [phone]  . potassium chloride SA (K-DUR,KLOR-CON) CR tablet 40 mEq  40 mEq Oral BID Lucious Groves, DO   40 mEq at 12/10/17 2143  . simethicone (MYLICON) chewable tablet 80 mg  80 mg Oral QID PRN Zada Finders, MD      . sodium chloride flush (NS) 0.9 % injection 3 mL  3 mL Intravenous Q12H Maryellen Pile, MD   3 mL at 12/10/17 2144  . sodium chloride flush (NS) 0.9 % injection 3 mL  3 mL Intravenous PRN Maryellen Pile, MD   3 mL at 12/06/17 0823  . traMADol (ULTRAM) tablet 50 mg  50 mg Oral Q6H PRN Zada Finders, MD   50 mg at 12/11/17 0054     Discharge Medications: Please see discharge summary for a list of discharge medications.  Relevant Imaging Results:  Relevant Lab Results:   Additional Information SS# [id number] pt weighs 352lb bibap nightly   Wende Neighbors, LCSW

## 2017-12-11 NOTE — Progress Notes (Signed)
Physical Therapy Treatment Patient Details Name: Chelsea [name] MRN: [id number] DOB: 10-May-1966 Today's Date: 12/11/2017    History of Present Illness Pt is a 52 year old female with a past medical history significant for HFpEF, COPD on chronic 2L nasal cannula, obesity hypoventilation syndrome, diabetes mellitus type 2, hypertension, hyperlipidemia, and chronic lymphedema.  She presented to the emergency room  with complaints of dyspnea with exertion.  She was just admitted to a hospital in [place] between February 17 and February 25 for hypoxic and hypercarbic respiratory failure precipitated by pneumonia requiring BiPAP but not intubation.     PT Comments    Pt performed gait training with bariatric rollator.  Remains to desat with minimal activity and unable to perform pericare during session.  Pt presents with decreased functional capacity during session and continues to benefit from ST skilled placement to improve strength and function before returning home to her private residence.    Follow Up Recommendations  SNF     Equipment Recommendations  Other (comment)(bariatric rollator)    Recommendations for Other Services       Precautions / Restrictions Precautions Precautions: Fall;Other (comment) Precaution Comments: watch sats Restrictions Weight Bearing Restrictions: No    Mobility  Bed Mobility               General bed mobility comments: Pt sitting on BSC on arrival  Transfers Overall transfer level: Needs assistance Equipment used: 4-wheeled walker(bariatric rollator) Transfers: Sit to/from [employer] Transfers Sit to Stand: Min guard Stand pivot transfers: Min guard       General transfer comment: increased time and effort, increased time to stabilize initial standing balance  Ambulation/Gait Ambulation/Gait assistance: Min guard Ambulation Distance (Feet): 40 Feet Assistive device: 4-wheeled walker(bariatric rollator.  ) Gait  Pattern/deviations: Step-through pattern;Decreased stride length;Wide base of support Gait velocity: decreased Gait velocity interpretation: Below normal speed for age/gender General Gait Details: Pt on 4L [place] during gait training. SPO2 pre gait 92%, post gait 83% but returned to 90% after seated rest break and pursed lip breathing. Cues for forward gaze and safety with rollator.     Stairs            Wheelchair Mobility    Modified Rankin (Stroke Patients Only)       Balance Overall balance assessment: Needs assistance   Sitting balance-Leahy Scale: Good       Standing balance-Leahy Scale: Poor Standing balance comment: heavy reliance on RW                            Cognition Arousal/Alertness: Awake/alert Behavior During Therapy: WFL for tasks assessed/performed Overall Cognitive Status: Within Functional Limits for tasks assessed                                        Exercises      General Comments        Pertinent Vitals/Pain Pain Assessment: 0-10 Pain Score: 6  Pain Location: BLE, low back Pain Descriptors / Indicators: Aching;Sore Pain Intervention(s): Monitored during session;Limited activity within patient's tolerance;Repositioned    Home Living                      Prior Function            PT Goals (current goals can now be found in the care  plan section) Acute Rehab PT Goals Patient Stated Goal: get stronger before going home Potential to Achieve Goals: Good Progress towards PT goals: Progressing toward goals    Frequency    Min 3X/week      PT Plan Current plan remains appropriate    Co-evaluation              AM-PAC PT "6 Clicks" Daily Activity  Outcome Measure  Difficulty turning over in bed (including adjusting bedclothes, sheets and blankets)?: A Lot Difficulty moving from lying on back to sitting on the side of the bed? : Unable Difficulty sitting down on and standing up from a  chair with arms (e.g., wheelchair, bedside commode, etc,.)?: A Lot Help needed moving to and from a bed to chair (including a wheelchair)?: A Little Help needed walking in hospital room?: A Little Help needed climbing 3-5 steps with a railing? : A Lot 6 Click Score: 13    End of Session Equipment Utilized During Treatment: Gait belt;Oxygen Activity Tolerance: Patient tolerated treatment well Patient left: in bed;with call bell/phone within reach(sitting EOB) Nurse Communication: Mobility status PT Visit Diagnosis: Muscle weakness (generalized) (M62.81);Difficulty in walking, not elsewhere classified (R26.2)     Time: 0947-0962 PT Time Calculation (min) (ACUTE ONLY): 27 min  Charges:  $Gait Training: 8-22 mins $Therapeutic Activity: 8-22 mins                    G Codes:       Governor Rooks, PTA pager [phone]    Cristela Blue 12/11/2017, 2:43 PM

## 2017-12-12 LAB — GLUCOSE, CAPILLARY
GLUCOSE-CAPILLARY: 160 mg/dL — AB (ref 65–99)
Glucose-Capillary: 164 mg/dL — ABNORMAL HIGH (ref 65–99)
Glucose-Capillary: 159 mg/dL — ABNORMAL HIGH (ref 65–99)

## 2017-12-12 NOTE — Progress Notes (Signed)
Clinical Social Worker following patient for discharge plan and support. CSW has been assisting patient in attaining a SNF bed. CSW has reached out to social work Print production planner to get some assistance in placing patient. During first encounter with patient, patients chart stated she only had Medicaid but after speaking with patient, CSW found out patient had [facility] as a primary. CSW verified insurance and insurance coverage. CSW gave copy of insurance to secretary on the floor to fax to admissions, so they are able to place new insurance card on patients chart.  CSW was still unable to place patient into a SNF even with confirmation of primary insurance card. CSW spoke to patients MD and MD stated she felt comfortable with sending patient home with home health to follow. CSW spoke with patient about  discharging home and she stated she feel safe with the discharge plan. CSW went over [facility] equipment patient is needing for home and relayed information to [facility] on floor. RNCM stated she will set patient up with home health.  Patient stated her brother will pick her up from hospital and take her home. Patient stated she understands discharge plan and is ready to go home. CSW signing off, as patients needs have been met. Please consult  CSW again if intervention is needed.   Rhea Pink, MSW,  [place]

## 2017-12-12 NOTE — Plan of Care (Signed)
  Progressing Education: Knowledge of General Education information will improve 12/12/2017 0151 - Progressing by Peggye Pitt, RN Coping: Level of anxiety will decrease 12/12/2017 0151 - Progressing by Peggye Pitt, RN Safety: Ability to remain free from injury will improve 12/12/2017 0151 - Progressing by Peggye Pitt, RN   Not Progressing Health Behavior/Discharge Planning: Ability to manage health-related needs will improve 12/12/2017 0151 - Not Progressing by Peggye Pitt, RN Clinical Measurements: Respiratory complications will improve 12/12/2017 0151 - Not Progressing by Peggye Pitt, RN

## 2017-12-12 NOTE — Care Management Note (Addendum)
Case Management Note Chelsea Gibbons RN, BSN Unit 4E-Case Manager [phone]  Patient Details  Name: Chelsea [name] MRN: [id number] Date of Birth: Dec 12, 1965  Subjective/Objective:  Pt admitted with acute on chronic HF                  Action/Plan: PTA pt lived at home- per PT recommendation CSW following for possible SNF placement- at baseline pt has home 02 at 3L and PCP Chelsea [name]  Expected Discharge Date:   12/12/17               Expected Discharge Plan:  [place] Referral:  Clinical Social Work  Discharge planning Services  CM Consult  Post Acute Care Choice:  [place], Resumption of Svcs/PTA Provider, Durable Medical Equipment Choice offered to:  Patient  DME Arranged:  Walker rolling with seat DME Agency:  [place] Arranged:  RN, PT, OT [facility] Agency:  [place]  Status of Service:  Completed, signed off  If discussed at [place] of Stay Meetings, dates discussed:  3/12, 3/14  Discharge Disposition: home/home health   Additional Comments:  12/12/17- 1600- Chelsea Gibbons RN, CM- received notice from [place] that she is unable to get insurance approval to place pt in STSNF. Pt and MD team agreeable to d/c home with [facility] services- spoke with pt at bedside- per conversation pt has DME at home that includes- cane, BSC, shower chair, CPAP, Bipap, portable concentrator- has home 02 with [place] Patient, - Pt has portable concentrator here for transport home, per pt she has a ride home. Confirmed she has a PCP and no difficulty affording medications- Pt has both Medicaid and [employer]. Pt does need a Bariatric rollator- [facility] does not carry this in stock here for delivery to room pt will need to go by [facility] store for pickup- will print order once placed by MD and give to pt. Pt also states that she is active with [facility]- call made to [place] with [facility]- and confirmed pt active for RN/PT/OT- will  need resumption orders for services they will follow pt for resumption of services- MD is aware and to place both DME and [place] orders prior to discharge. Pt also reports that she has meals delivered to her home. No further CM needs noted- pt will call for ride once d/c order placed.   Dawayne Patricia, RN 12/12/2017, 4:33 PM

## 2017-12-12 NOTE — Progress Notes (Signed)
Medicine attending: I examined this patient today together with resident physician Dr. Lars Mage and I concur with her evaluation and management plan which we discussed together. Clinically stable.  Improved ability to ambulate.  Oxygen saturation 98% on 3 L nasal oxygen at rest. Peak weight 367.5.  Current weight 350. Lungs overall clear.  No wheezes. Chronic lower extremity edema no change.  [place] nontender. Stable for discharge.  We communicated again this morning with our care management team.  If we cannot find a suitable bed for her today in an extended care facility we will discharge her to home.

## 2017-12-12 NOTE — Progress Notes (Addendum)
   Subjective: Chelsea [name] was seen laying in Chelsea [name] bed resting this morning. Chelsea [name] stated that Chelsea [name] was able to get up and walk around Chelsea [name] room twice yesterday. When Chelsea [name] ambulated Chelsea [name] pulse ox dropped to 85%.   Objective:  Vital signs in last 24 hours: Vitals:   12/11/17 1937 12/12/17 0412 12/12/17 0800 12/12/17 0839  BP: 90/67 92/63  114/74  Pulse: 70 69  77  Resp: (!) 22 (!) 21 10 18   Temp: 98.5 F (36.9 C) 98.1 F (36.7 C)  98.2 F (36.8 C)  TempSrc: Oral Oral  Oral  SpO2:    94%  Weight:  (!) 350 lb 1.5 oz (158.8 kg)    Height:       Physical Exam  Constitutional: Chelsea [name] appears well-developed and well-nourished. No distress.  HENT:  Head: Normocephalic and atraumatic.  Eyes: Conjunctivae are normal.  Cardiovascular: Normal rate, regular rhythm and normal heart sounds.  Respiratory: Effort normal and breath sounds normal. No respiratory distress. Chelsea [name] has no wheezes.  GI: Soft. Bowel sounds are normal. Chelsea [name] exhibits no distension. There is no tenderness.  Musculoskeletal: Chelsea [name] exhibits no edema.  Neurological: Chelsea [name] is alert. No cranial nerve deficit.  Skin: Chelsea [name] is not diaphoretic. No erythema.  Psychiatric: Chelsea [name] has a normal mood and affect. Chelsea [name] behavior is normal. Judgment and thought content normal.   Assessment/Plan:  Acute on chronic hypoxic and hypercapnic respiratory failure: The patient is currently saturating well on 3L via nasal cannula. There is no documentation of urinary output over the past 24 hrs. However, the patient is down -14L since admission and weighs 350lbs currently. Chelsea [name] highest weight this admission was 367lbs.   The patient's respiratory failure is likely multifactorial from HFpEF, OHS, and valvular dysfunction (aortic stenosis).   -Continue bipap nightly -Continue lasix 60mg  bid -Continue duonebs q4hrs prn -Continue dulera 2puffs bid  Hypertension The patient's blood pressure has been well controlled ranging 90-114/67-74 over the past 24 hrs.    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 -Continue levemir 25u qhs -SSI -CBG monitoring  -Carb modified diet -continue Tramadol 50 mg q6h prn for pain  QuestionableCOPD: Pulmonary notes from 2017 indicate that Chelsea [name] does not have COPD. On Dulera BID, duonebs prn.  Hypothyroidism: Continue homeSynthroid  DVT PPx: Lovenox 80 mg subQ daily  Dispo: Anticipated discharge. The patient is medically stable for discharge pending snf placement.  Lars Mage, MD 12/12/2017, 10:59 AM Pager: [phone]

## 2017-12-18 ENCOUNTER — Other Ambulatory Visit: Payer: Self-pay | Admitting: Internal Medicine

## 2017-12-18 ENCOUNTER — Telehealth: Payer: Self-pay | Admitting: Internal Medicine

## 2017-12-18 MED ORDER — BARIATRIC ROLLATOR MISC: 1.0000 | Freq: Every day | 0 refills | Status: DC

## 2017-12-18 NOTE — Telephone Encounter (Signed)
Hi [name],  Hope you are doing great! The order I placed on discharge was for a rollator walker . Please let me know if you do not not see the order. Thanks!  Chelsea [name]

## 2017-12-18 NOTE — Telephone Encounter (Signed)
Pt was seen on Inpatient Services on 12/05/2017 and was given a RX for a Teaching laboratory technician.  Per [facility] and the Patient caregiver a DME order needs to be placed for Bariatric Rollator instead due to Size of patient for Safety reasons.  Please Advise as I do not see a DME order for one in Epic.

## 2017-12-18 NOTE — Telephone Encounter (Signed)
Sent order. Please check its the right one. Thank you!  Chelsea [name]

## 2017-12-18 NOTE — Telephone Encounter (Signed)
Hello.  I can see a Order for a Regular Rollator Walker but, the order per [facility] for safety reasons must be for a Bariatric Rollator Walker instead.

## 2017-12-24 NOTE — Telephone Encounter (Signed)
Correct order has been placed and faxed to [facility]

## 2018-01-09 ENCOUNTER — Other Ambulatory Visit: Payer: Self-pay | Admitting: Internal Medicine

## 2018-02-23 DIAGNOSIS — I5032 Chronic diastolic (congestive) heart failure: Secondary | ICD-10-CM

## 2018-02-23 HISTORY — DX: Chronic diastolic (congestive) heart failure: I50.32

## 2018-02-24 DIAGNOSIS — G4733 Obstructive sleep apnea (adult) (pediatric): Secondary | ICD-10-CM | POA: Insufficient documentation

## 2018-02-24 DIAGNOSIS — E1140 Type 2 diabetes mellitus with diabetic neuropathy, unspecified: Secondary | ICD-10-CM | POA: Insufficient documentation

## 2018-02-24 HISTORY — DX: Type 2 diabetes mellitus with diabetic neuropathy, unspecified: E11.40

## 2018-02-28 ENCOUNTER — Other Ambulatory Visit: Payer: Self-pay

## 2018-02-28 DIAGNOSIS — I83893 Varicose veins of bilateral lower extremities with other complications: Secondary | ICD-10-CM

## 2018-02-28 DIAGNOSIS — R609 Edema, unspecified: Secondary | ICD-10-CM

## 2018-03-12 ENCOUNTER — Encounter: Payer: Medicare HMO | Admitting: Vascular Surgery

## 2018-03-12 ENCOUNTER — Inpatient Hospital Stay (HOSPITAL_COMMUNITY): Admission: RE | Admit: 2018-03-12 | Payer: Medicare HMO | Source: Ambulatory Visit

## 2018-04-14 DIAGNOSIS — B1920 Unspecified viral hepatitis C without hepatic coma: Secondary | ICD-10-CM | POA: Insufficient documentation

## 2018-04-15 ENCOUNTER — Ambulatory Visit: Payer: Medicare HMO | Admitting: Gastroenterology

## 2018-04-15 ENCOUNTER — Encounter: Payer: Self-pay | Admitting: Gastroenterology

## 2018-05-10 DIAGNOSIS — R5381 Other malaise: Secondary | ICD-10-CM | POA: Insufficient documentation

## 2018-05-10 HISTORY — DX: Other malaise: R53.81

## 2018-07-02 ENCOUNTER — Ambulatory Visit (INDEPENDENT_AMBULATORY_CARE_PROVIDER_SITE_OTHER): Payer: Medicare HMO | Admitting: Pulmonary Disease

## 2018-07-02 ENCOUNTER — Encounter: Payer: Self-pay | Admitting: Pulmonary Disease

## 2018-07-02 ENCOUNTER — Other Ambulatory Visit (INDEPENDENT_AMBULATORY_CARE_PROVIDER_SITE_OTHER): Payer: Medicare HMO

## 2018-07-02 DIAGNOSIS — G4733 Obstructive sleep apnea (adult) (pediatric): Secondary | ICD-10-CM

## 2018-07-02 DIAGNOSIS — Z23 Encounter for immunization: Secondary | ICD-10-CM | POA: Diagnosis not present

## 2018-07-02 DIAGNOSIS — J9611 Chronic respiratory failure with hypoxia: Secondary | ICD-10-CM | POA: Insufficient documentation

## 2018-07-02 HISTORY — DX: Chronic respiratory failure with hypoxia: J96.11

## 2018-07-02 LAB — BASIC METABOLIC PANEL
BUN: 24 mg/dL — AB (ref 6–23)
CALCIUM: 10 mg/dL (ref 8.4–10.5)
CO2: 36 mEq/L — ABNORMAL HIGH (ref 19–32)
CREATININE: 1.07 mg/dL (ref 0.40–1.20)
Chloride: 94 mEq/L — ABNORMAL LOW (ref 96–112)
GFR: 56.7 mL/min — AB (ref >60.00)
Glucose, Bld: 94 mg/dL (ref 70–99)
Potassium: 4 mEq/L (ref 3.5–5.1)
Sodium: 140 mEq/L (ref 135–145)

## 2018-07-02 LAB — MAGNESIUM: Magnesium: 1.7 mg/dL (ref 1.5–2.5)

## 2018-07-02 NOTE — Progress Notes (Signed)
Subjective:    Patient ID: Chelsea [name], female    DOB: 10-Dec-1965, 52 y.o.   MRN: [id number]  HPI  52/F never smoker, morbidly obese for FU of obstructive sleep apnea/ OHS &diastolic heart failure .  On CPAP & O2 since 2004 after cervical CA surgery.  She had an episode of mechanical ventilation in 2012.  She has moderate aortic stenosis   Chief Complaint  Patient presents with  . Follow-up    Patient is requesting a new sleep study. Patient currently has a machine but believes it is not working well. Patient is using [facility] as her DME. She is also requesting a flu vaccine.    She had several admissions for what seems to be acute on chronic diastolic heart failure over the last 8 to 9 months to different hospitals including [place] and [facility].  She is now maintained on 80 of Lasix daily. She also seems to be on Symbicort as well as Perforomist.  I clarified that she only uses Perforomist as needed and takes Symbicort once daily.  She denies wheezing. Significant pedal edema persists.  She reports cramps in her legs and wonders if her magnesium level is low.  She is on potassium supplementation.  She is compliant with her BiPAP machine at night, uses 2 L pulse blended in both during sleep as well as during the daytime.  She feels that she is not getting enough air like her machine is cutting off, reviewed her PSG from 6 years ago  Saturation was 96% on 2 L pulse and on ambulation required 5 L pulse to maintain saturation of 87%  Significant tests/ events  10/2010 adm with pneumonia and respiratory failure requiring mech ventilation.   PSG 11/2010 >>(322 lbs) Severe >> CPAP of 18 cm with a C-Flex of 3 cm with 2 L of oxygen blended in >> did not tolerate   PSG 07/2011>>bipap 20/14 sm ff mask h/h biflex+3cm   PFT 07/2016 >> No airway obstruction with a ratio of 80, severe restriction with FVC 41%, DLCO not accurate but corrects for alveolar volume  Past Medical  History:  Diagnosis Date  . Aortic stenosis    Echocardiogram 02/21/11:  Mean gradient 23 mm of mercury; peak gradient 36;; Turbulence noted in the area of the aortic isthmus with peak gradient 23 mmHg-consider MRI to assess for coarctation;    TEE: 04/02/11:  EF 55-60%, mod BAE, trileaflet AV with mild AS (pk and mean 25 and 16), mild AI, mild MR, mild TR, atrial septal aneurysm, no evidence of corarctation  . Arthritis    "qwhere" (12/05/2017)  . Asthma   . Cervical cancer ([place]) 2006  . CHF (congestive heart failure) ([place])   . Chronic lower back pain   . Complication of anesthesia    "I have a hard time waking up from under it" (12/05/2017)  . COPD (chronic obstructive pulmonary disease) ([place])    "lung dr said I don't have this" (12/05/2017)  . Coronary artery disease   . Diastolic congestive heart failure ([place])    Echo 02/21/11: EF 55-60%, moderate LVH, grade 2 diastolic dysfunction, mild to moderate aortic stenosis with a mean gradient 23 mm of mercury  . GERD (gastroesophageal reflux disease)   . Heart murmur   . Hepatitis C   . History of gout X 1  . Hyperlipidemia   . Hypertension   . Hypothyroid   . IBS (irritable bowel syndrome)   . Migraine    "monthly" (  12/05/2017)  . Obesity hypoventilation syndrome ([place])   . On home oxygen therapy    "2L all the time" (12/05/2017)  . OSA treated with BiPAP    "have CPAP at home too; wearing BiPAP right now" (12/05/2017)  . Pneumonia    "several times" (12/05/2017)  . Type II diabetes mellitus (HCC)      Review of Systems neg for any significant sore throat, dysphagia, itching, sneezing, nasal congestion or excess/ purulent secretions, fever, chills, sweats, unintended wt loss, pleuritic or exertional cp, hempoptysis, orthopnea pnd or change in chronic leg swelling. Also denies presyncope, palpitations, heartburn, abdominal pain, nausea, vomiting, diarrhea or change in bowel or urinary habits, dysuria,hematuria, rash, arthralgias, visual complaints,  headache, numbness weakness or ataxia.     Objective:   Physical Exam  Gen. Pleasant, obese, in no distress ENT - no thrush, class 3 airway Neck: No JVD, no thyromegaly, no carotid bruits Lungs: no use of accessory muscles, no dullness to percussion, decreased without rales or rhonchi  Cardiovascular: Rhythm regular, heart sounds  normal, no murmurs or gallops, 2+ peripheral edema Musculoskeletal: No deformities, no cyanosis or clubbing , no tremors       Assessment & Plan:

## 2018-07-02 NOTE — Assessment & Plan Note (Signed)
Schedule BiPAP/ O2  titration study and based on this we will try to get you a new machine  Weight loss encouraged, compliance with goal of at least 4-6 hrs every night is the expectation. Advised against medications with sedative side effects Cautioned against driving when sleepy - understanding that sleepiness will vary on a day to day basis

## 2018-07-02 NOTE — Assessment & Plan Note (Signed)
On lasix 80 daily Blood work today- BMET & magnesium level

## 2018-07-02 NOTE — Assessment & Plan Note (Addendum)
Flu shot today. I have asked her to use 2 L at rest and 5 L pulse on walking  We will recheck on her next visit

## 2018-07-02 NOTE — Patient Instructions (Signed)
Flu shot today. Ambulatory saturation on oxygen to decide what level of pulse O2 you need. Blood work today- BMET & magnesium level  Schedule BiPAP/ O2  titration study and based on this we will try to get you a new machine

## 2018-07-08 ENCOUNTER — Telehealth: Payer: Self-pay | Admitting: Pulmonary Disease

## 2018-07-08 NOTE — Telephone Encounter (Signed)
Called and spoke with patient advised her that her lab results were normal. Patient is requesting sleep study results. RA please advise, thank you.

## 2018-07-09 NOTE — Telephone Encounter (Signed)
I see that her bipap/O2 titration study is schedueld for 11/5 Is there any other study?

## 2018-07-09 NOTE — Telephone Encounter (Signed)
Spoke with the pt  She was not wanting the results  She was asking for the date and time of her titration study  I advised her it is scheduled at the [facility] sleep center for 08/05/18 at 8 pm She verbalized understanding  Nothing further needed

## 2018-07-21 ENCOUNTER — Encounter: Payer: Self-pay | Admitting: Gastroenterology

## 2018-08-05 ENCOUNTER — Ambulatory Visit (HOSPITAL_BASED_OUTPATIENT_CLINIC_OR_DEPARTMENT_OTHER): Payer: Medicare HMO | Attending: Pulmonary Disease | Admitting: Pulmonary Disease

## 2018-08-05 VITALS — Ht 65.0 in | Wt 348.0 lb

## 2018-08-05 DIAGNOSIS — G4733 Obstructive sleep apnea (adult) (pediatric): Secondary | ICD-10-CM | POA: Insufficient documentation

## 2018-08-11 ENCOUNTER — Telehealth: Payer: Self-pay | Admitting: Pulmonary Disease

## 2018-08-11 DIAGNOSIS — G4733 Obstructive sleep apnea (adult) (pediatric): Secondary | ICD-10-CM

## 2018-08-11 DIAGNOSIS — J9611 Chronic respiratory failure with hypoxia: Secondary | ICD-10-CM

## 2018-08-11 NOTE — Telephone Encounter (Signed)
Reviewed titration study - She needed CPAP  20 cm H2O + 3L O2 OK to order new auto machine 10-20 cm  with a Small size Resmed Full Face Mask AirFit 20  +3L o2  blended

## 2018-08-11 NOTE — Procedures (Signed)
  Patient Name: Chelsea [name], Chelsea [name] Date: 08/05/2018   Gender: Female  D.O.B: [birth year]  Age (years): 46  Referring Provider: Kara Mead MD, ABSM  Height (inches): 65  Interpreting Physician: Kara Mead MD, ABSM  Weight (lbs): 348  RPSGT: Laren Everts  BMI: 58  MRN: [id number]  Neck Size: 19.50  <br> <br>  CLINICAL INFORMATION  The patient is referred for a CPAP titration to treat sleep apnea. Date of NPSG: PSG 11/2010 >> (322 lbs) Severe OSA?>>?CPAP of 18 cm with a C-Flex of 3 cm with 2 L of oxygen blended in >> did not tolerate PSG 07/2011>>bipap 20/14 small full face mask   SLEEP STUDY TECHNIQUE  As per the AASM Manual for the Scoring of Sleep and Associated Events v2.3 (April 2016) with a hypopnea requiring 4% desaturations. The channels recorded and monitored were frontal, central and occipital EEG, electrooculogram (EOG), submentalis EMG (chin), nasal and oral airflow, thoracic and abdominal wall motion, anterior tibialis EMG, snore microphone, electrocardiogram, and pulse oximetry. Continuous positive airway pressure (CPAP) was initiated at the beginning of the study and titrated to treat sleep-disordered breathing. MEDICATIONS  Medications self-administered by patient taken the night of the study : GABAPENTIN, LEVOTHYROXINE, ATORVASTATIN RESPIRATORY PARAMETERS  Optimal PAP Pressure (cm): 20 AHI at Optimal Pressure (/hr): 3.8  Overall Minimal O2 (%): 52.0 Supine % at Optimal Pressure (%): 0  Minimal O2 at Optimal Pressure (%): 85.0      SLEEP ARCHITECTURE  The study was initiated at 11:15:43 PM and ended at 5:24:40 AM. Sleep onset time was 4.2 minutes and the sleep efficiency was 83.1%%. The total sleep time was 306.5 minutes. The patient spent 7.3%% of the night in stage N1 sleep, 67.4%% in stage N2 sleep, 0.0%% in stage N3 and 25.3% in REM.Stage REM latency was 74.5 minutes Wake after sleep onset was 58.2. Alpha intrusion was absent. Supine sleep was  0.00%. CARDIAC DATA  The 2 lead EKG demonstrated sinus rhythm. The mean heart rate was 75.2 beats per minute. Other EKG findings include: PVCs.  LEG MOVEMENT DATA  The total Periodic Limb Movements of Sleep (PLMS) were 0. The PLMS index was 0.0. A PLMS index of <15 is considered normal in adults. IMPRESSIONS  - The optimal PAP pressure was 20 cm of water. - Central sleep apnea was not noted during this titration (CAI = 0.2/h).  - Severe oxygen desaturations were observed during this titration (min O2 = 52.0%) corrected by 3L O2. - The patient snored with moderate snoring volume during this titration study.  - 2-lead EKG demonstrated: PVCs  - Clinically significant periodic limb movements were not noted during this study. Arousals associated with PLMs were rare. DIAGNOSIS  - Obstructive Sleep Apnea (327.23 [G47.33 ICD-10]) - Nocturnal hypoxia , corrected by 3 L O2 RECOMMENDATIONS  - Trial of CPAP therapy on 20 cm H2O with a Small size Resmed Full Face Mask AirFit 20 for Her mask and heated humidification. SHe had difficulty tolerating high pressure & may need bilevel. - 3L o2 was blended into PAP - Avoid alcohol, sedatives and other CNS depressants that may worsen sleep apnea and disrupt normal sleep architecture.  - Sleep hygiene should be reviewed to assess factors that may improve sleep quality.  - Weight management and regular exercise should be initiated or continued.  - Return to Sleep Center for re-evaluation after 4 weeks of therapy   Kara Mead MD Board Certified in [place]

## 2018-08-18 NOTE — Progress Notes (Deleted)
Referring Provider: Barnie Mort, NP Primary Care Physician:  Barnie Mort, NP   Reason for Consultation:  Diarrhea   IMPRESSION:  ***  PLAN: ***   HPI: Chelsea [name] is a 52 y.o. female   . Acute on chronic diastolic (congestive) heart failure ([place])  . Hypothyroidism  . COPD (chronic obstructive pulmonary disease) ([place])  . OSA on CPAP  . Diabetes mellitus ([place])  . Neuropathy due to type 2 diabetes mellitus [facility])   RECENT LABS: Results for orders placed or performed during the hospital encounter of 02/23/18  Blood Culture  Result Value Ref Range  Culture, Blood No growth in 5 days  Blood Culture  Result Value Ref Range  Culture, Blood No growth in 5 days  C Difficile EIA  Result Value Ref Range  C Difficile EIA Source Stool  C Diff EIA Interpretation  Interpretation: C difficile present but toxin not detected.  Comment: This pattern is most consistent with C. difficile colonization (occurs in 20% of hospitalized patients). There is no indication to treat C. difficile colonization and anti-C. difficile antibiotics will not prevent the subsequent infection. Antimicrobial therapy and proton pump inhibitors should be avoided. Consideration should be given to medication causes of diarrhea, such as laxatives, stool softeners, colchicine, metformin, HIV protease inhibitors, antibiotics, or certain chemotherapy agents, among others. Enteral feeds are also a common cause of diarrhea. If symptoms and signs are consistent with colitis and risk factors are present C. difficile (e.g. recent antibiotic exposure), additional testing using C. difficile PCR may be performed. C. difficile PCR testing requires prior authorization by the CAUSE delegate (web on-call or [phone]) or via formal ID consultation.    Past Medical History:  Diagnosis Date  . Aortic stenosis    Echocardiogram 02/21/11:  Mean gradient 23 mm of mercury; peak gradient 36;; Turbulence noted in the  area of the aortic isthmus with peak gradient 23 mmHg-consider MRI to assess for coarctation;    TEE: 04/02/11:  EF 55-60%, mod BAE, trileaflet AV with mild AS (pk and mean 25 and 16), mild AI, mild MR, mild TR, atrial septal aneurysm, no evidence of corarctation  . Arthritis    "qwhere" (12/05/2017)  . Asthma   . Cervical cancer ([place]) 2006  . CHF (congestive heart failure) ([place])   . Chronic lower back pain   . Complication of anesthesia    "I have a hard time waking up from under it" (12/05/2017)  . COPD (chronic obstructive pulmonary disease) ([place])    "lung dr said I don't have this" (12/05/2017)  . Coronary artery disease   . Diastolic congestive heart failure ([place])    Echo 02/21/11: EF 55-60%, moderate LVH, grade 2 diastolic dysfunction, mild to moderate aortic stenosis with a mean gradient 23 mm of mercury  . GERD (gastroesophageal reflux disease)   . Heart murmur   . Hepatitis C   . History of gout X 1  . Hyperlipidemia   . Hypertension   . Hypothyroid   . IBS (irritable bowel syndrome)   . Migraine    "monthly" (12/05/2017)  . Obesity hypoventilation syndrome ([place])   . On home oxygen therapy    "2L all the time" (12/05/2017)  . OSA treated with BiPAP    "have CPAP at home too; wearing BiPAP right now" (12/05/2017)  . Pneumonia    "several times" (12/05/2017)  . Type II diabetes mellitus ([place])     Past Surgical History:  Procedure Laterality Date  .  ABDOMINAL SURGERY  2006 X 2   "TAH incision was infected"  . CHOLECYSTECTOMY OPEN    . TONSILLECTOMY    . TOTAL ABDOMINAL HYSTERECTOMY  2006    Current Outpatient Medications  Medication Sig Dispense Refill  . albuterol (PROVENTIL) (2.5 MG/3ML) 0.083% nebulizer solution Take 3 mLs (2.5 mg total) by nebulization every 6 (six) hours as needed for wheezing or shortness of breath (I50.32). 75 mL 12  . albuterol (VENTOLIN HFA) 108 (90 BASE) MCG/ACT inhaler Inhale 2 puffs into the lungs every 6 (six) hours as needed for wheezing. 1 Inhaler 5    . amLODipine (NORVASC) 5 MG tablet Take 5 mg by mouth daily.  2  . aspirin 81 MG tablet Take 81 mg by mouth daily.      Marland Kitchen atorvastatin (LIPITOR) 20 MG tablet     . budesonide-formoterol (SYMBICORT) 160-4.5 MCG/ACT inhaler Inhale into the lungs.    . cetirizine (ZYRTEC) 10 MG tablet Take by mouth.    . formoterol (PERFOROMIST) 20 MCG/2ML nebulizer solution 20 mcg.    . furosemide (LASIX) 80 MG tablet Take 1 tablet (80 mg total) by mouth 2 (two) times daily. 60 tablet 1  . gabapentin (NEURONTIN) 300 MG capsule Take 300 mg by mouth 2 (two) times daily.    Marland Kitchen gabapentin (NEURONTIN) 400 MG capsule TAKE 1 CAPSULE BY MOUTH THREE TIMES A DAY  0  . glimepiride (AMARYL) 2 MG tablet Take by mouth.    Marland Kitchen glucosamine-chondroitin 500-400 MG tablet Take 1 tablet by mouth 2 (two) times daily.      Marland Kitchen levothyroxine (SYNTHROID, LEVOTHROID) 112 MCG tablet Take 112 mcg by mouth daily.  3  . levothyroxine (SYNTHROID, LEVOTHROID) 150 MCG tablet TAKE 1 TABLET EVERY DAY 90 tablet 2  . Lidocaine-Menthol 3.6-1.25 % PTCH Place onto the skin.    Marland Kitchen loperamide (IMODIUM) 2 MG capsule Take by mouth.    . losartan (COZAAR) 25 MG tablet Take 25 mg by mouth daily.     . Misc. Devices (BARIATRIC ROLLATOR) MISC 1 each by Does not apply route daily. 1 each 0  . Multiple Vitamin (MULTIVITAMIN) tablet Take 1 tablet by mouth daily.      . NON FORMULARY Oxygen 3 liters and cpap    . potassium chloride SA (K-DUR,KLOR-CON) 20 MEQ tablet Take by mouth.    . TRADJENTA 5 MG TABS tablet Take 5 mg by mouth daily.    Marland Kitchen umeclidinium bromide (INCRUSE ELLIPTA) 62.5 MCG/INH AEPB Inhale into the lungs.     No current facility-administered medications for this visit.     Allergies as of 08/19/2018 - Review Complete 07/02/2018  Allergen Reaction Noted  . Penicillins Anaphylaxis 11/28/2010  . Minoxidil  03/14/2011    Family History  Problem Relation Age of Onset  . Heart disease Father     Social History   Socioeconomic History  .  Marital status: Divorced    Spouse name: Not on file  . Number of children: Not on file  . Years of education: Not on file  . Highest education level: Not on file  Occupational History  . Occupation: disabled  Social Needs  . Financial resource strain: Not on file  . Food insecurity:    Worry: Not on file    Inability: Not on file  . Transportation needs:    Medical: Not on file    Non-medical: Not on file  Tobacco Use  . Smoking status: Never Smoker  . Smokeless tobacco: Never Used  Substance and Sexual Activity  . Alcohol use: Yes    Comment: 12/05/2017 "1-2 drinks/year"  . Drug use: No  . Sexual activity: Not Currently  Lifestyle  . Physical activity:    Days per week: Not on file    Minutes per session: Not on file  . Stress: Not on file  Relationships  . Social connections:    Talks on phone: Not on file    Gets together: Not on file    Attends religious service: Not on file    Active member of club or organization: Not on file    Attends meetings of clubs or organizations: Not on file    Relationship status: Not on file  . Intimate partner violence:    Fear of current or ex partner: Not on file    Emotionally abused: Not on file    Physically abused: Not on file    Forced sexual activity: Not on file  Other Topics Concern  . Not on file  Social History Narrative  . Not on file    Review of Systems: 12 system ROS is negative except as noted above.  There were no vitals filed for this visit.  Physical Exam: Vital signs were reviewed. General:   Alert, well-nourished, pleasant and cooperative in NAD Head:  Normocephalic and atraumatic. Eyes:  Sclera clear, no icterus.   Conjunctiva pink. Mouth:  No deformity or lesions.   Neck:  Supple; no thyromegaly. Lungs:  Clear throughout to auscultation.   No wheezes.  Heart:  Regular rate and rhythm; no murmurs Abdomen:  Soft, nontender, normal bowel sounds. No rebound or guarding. No hepatosplenomegaly Rectal:   Deferred  Msk:  Symmetrical without gross deformities. Extremities:  No gross deformities or edema. Neurologic:  Alert and  oriented x4;  grossly nonfocal Skin:  No rash or bruise. Psych:  Alert and cooperative. Normal mood and affect.   Nicolemarie Wooley L. Tarri Glenn Md, MPH [facility] 08/18/2018, 4:57 PM

## 2018-08-19 ENCOUNTER — Ambulatory Visit: Payer: Medicare HMO | Admitting: Gastroenterology

## 2018-08-25 DIAGNOSIS — I152 Hypertension secondary to endocrine disorders: Secondary | ICD-10-CM

## 2018-08-25 DIAGNOSIS — E66813 Obesity, class 3: Secondary | ICD-10-CM

## 2018-08-25 DIAGNOSIS — Z6841 Body Mass Index (BMI) 40.0 and over, adult: Secondary | ICD-10-CM | POA: Insufficient documentation

## 2018-08-25 DIAGNOSIS — E1159 Type 2 diabetes mellitus with other circulatory complications: Secondary | ICD-10-CM

## 2018-08-25 HISTORY — DX: Type 2 diabetes mellitus with other circulatory complications: E11.59

## 2018-08-25 HISTORY — DX: Hypertension secondary to endocrine disorders: I15.2

## 2018-09-01 ENCOUNTER — Ambulatory Visit: Payer: Medicare HMO | Admitting: Nurse Practitioner

## 2018-09-03 NOTE — Telephone Encounter (Signed)
lmtcb for pt. Will order cpap after discussing with patient.

## 2018-09-12 ENCOUNTER — Ambulatory Visit: Payer: Medicare HMO | Admitting: Gastroenterology

## 2018-09-15 NOTE — Telephone Encounter (Signed)
Pt returned call. Gave her the results of the sleep study and stated to  Her recs per RA. Pt expressed understanding. Sent order for cpap to pt's DME that she currently uses for O2. Nothing further needed.

## 2018-09-15 NOTE — Telephone Encounter (Signed)
LMTCB x2  

## 2018-09-18 ENCOUNTER — Telehealth: Payer: Self-pay | Admitting: Pulmonary Disease

## 2018-09-18 NOTE — Telephone Encounter (Signed)
Pt's OV notes from 07/02/18 has been faxed to [place] Patient to the provided fax number. Nothing further needed.

## 2018-10-30 ENCOUNTER — Telehealth: Payer: Self-pay | Admitting: Pulmonary Disease

## 2018-10-30 NOTE — Telephone Encounter (Signed)
Spoke with Cherina who advised that she did not have this form for cpap supplies for this pt.  Called [facility] and spoke with Mickel Baas who is refaxing form.  Verified fax #.  Will await fax.

## 2018-10-31 NOTE — Telephone Encounter (Signed)
Noted by triage. Will route this message to [facility] for f/u until form has been received.

## 2018-10-31 NOTE — Telephone Encounter (Signed)
Checked RA's cubby. Did not see forms. Rodena Piety, have you received any forms or CMN on this patient recently?

## 2018-10-31 NOTE — Telephone Encounter (Signed)
We have this form from [facility] Patient and it is in Dr. Bari Mantis CMN folder for him to sign before leaves today

## 2018-11-03 NOTE — Telephone Encounter (Signed)
Dr. Elsworth Soho did sign this CMN on Friday but I didn't get it back until later in the day. It has now been faxed and I received confirmation that it was received by [facility]

## 2018-11-03 NOTE — Telephone Encounter (Signed)
Please advise if this has been taken care of. Thanks.  

## 2018-11-12 DIAGNOSIS — B353 Tinea pedis: Secondary | ICD-10-CM

## 2018-11-12 HISTORY — DX: Tinea pedis: B35.3

## 2019-01-28 ENCOUNTER — Telehealth: Payer: Self-pay | Admitting: Pulmonary Disease

## 2019-01-28 NOTE — Telephone Encounter (Signed)
Pulled up [facility] to check compliance report of pt's CPAP and on the report from the date range of  12/28/2018 - 01/26/2019, it shows that there were 0 days where pt had compliance using the CPAP. There were 4 days on there where pt had less than 4 hours of usage of the CPAP.  [place] Patient and spoke with Eustaquio Maize in regards to her message. Stated to her that I was able to see where pt had no compliance with her cpap and Beth stated to me when they called and spoke with pt in regards to this, pt told them that she went out of town and pt did not feel like she needed to take the CPAP with her to wear.   Routing to Dr. Elsworth Soho as an Juluis Rainier that pt's CPAP is being picked up.

## 2019-02-16 DIAGNOSIS — M10042 Idiopathic gout, left hand: Secondary | ICD-10-CM | POA: Insufficient documentation

## 2019-02-16 HISTORY — DX: Idiopathic gout, left hand: M10.042

## 2019-06-18 DIAGNOSIS — B351 Tinea unguium: Secondary | ICD-10-CM

## 2019-06-18 DIAGNOSIS — R197 Diarrhea, unspecified: Secondary | ICD-10-CM

## 2019-06-18 DIAGNOSIS — K580 Irritable bowel syndrome with diarrhea: Secondary | ICD-10-CM | POA: Insufficient documentation

## 2019-06-18 DIAGNOSIS — G8929 Other chronic pain: Secondary | ICD-10-CM | POA: Insufficient documentation

## 2019-06-18 HISTORY — DX: Irritable bowel syndrome with diarrhea: K58.0

## 2019-06-18 HISTORY — DX: Tinea unguium: B35.1

## 2019-06-18 HISTORY — DX: Diarrhea, unspecified: R19.7

## 2019-09-17 DIAGNOSIS — L659 Nonscarring hair loss, unspecified: Secondary | ICD-10-CM

## 2019-09-17 HISTORY — DX: Nonscarring hair loss, unspecified: L65.9

## 2020-03-16 ENCOUNTER — Encounter: Payer: Self-pay | Admitting: Pulmonary Disease

## 2020-03-16 ENCOUNTER — Ambulatory Visit (INDEPENDENT_AMBULATORY_CARE_PROVIDER_SITE_OTHER): Payer: Medicare Other | Admitting: Pulmonary Disease

## 2020-03-16 ENCOUNTER — Other Ambulatory Visit: Payer: Self-pay

## 2020-03-16 DIAGNOSIS — J9611 Chronic respiratory failure with hypoxia: Secondary | ICD-10-CM | POA: Diagnosis not present

## 2020-03-16 DIAGNOSIS — E662 Morbid (severe) obesity with alveolar hypoventilation: Secondary | ICD-10-CM | POA: Diagnosis not present

## 2020-03-16 NOTE — Addendum Note (Signed)
Addended by: Edythe Clarity on: 03/16/2020 04:38 PM   Modules accepted: Orders

## 2020-03-16 NOTE — Progress Notes (Signed)
° °  Subjective:    Patient ID: Chelsea [name], female    DOB: 04/06/1966, 54 y.o.   MRN: [id number]  HPI  54 yo never smoker, morbidly obese for FU of obstructive sleep apnea/ OHS &chronic hypoxic respiratory failureOn CPAP & O2 since 2004 after cervical CA surgery.    PMH - chr diastolic heart failure ,She had an episode of mechanical ventilation in 2012.  She has moderate aortic stenosis   She has done well relatively during the pandemic, stays indoors.  Her family members got sick with Covid but she has still not received Covid vaccines. Last office visit 2 years ago, she was having frequent hospitalizations for heart failure and fluid retention, she now takes Lasix as needed and watches salt in her diet and has been able to stay away from hospitalization.  She is compliant with oxygen uses 2 L pulse on her POC and 2 L continuous at home. She is taking both Symbicort and Perforomist, Incruse seems to have fallen off her list-she does not need albuterol much  Significant tests/ events reviewed  10/2010 adm with pneumonia and respiratory failure requiring mech ventilation.   PSG 11/2010 >>(322 lbs)Severe >> CPAP of 18 cm with a C-Flex of 3 cm with 2 L of oxygen blended in >> did not tolerate   PSG 07/2011>>bipap 20/14 sm ff mask h/h biflex+3cm   PFT 07/2016 >>No airway obstruction with a ratio of 80,severe restriction with FVC 41%,DLCO not accurate but corrects for alveolar volume    Review of Systems Patient denies significant dyspnea,cough, hemoptysis,  chest pain, palpitations, pedal edema, orthopnea, paroxysmal nocturnal dyspnea, lightheadedness, nausea, vomiting, abdominal or  leg pains      Objective:   Physical Exam  Gen. Pleasant, obese, in no distress ENT - no lesions, no post nasal drip Neck: No JVD, no thyromegaly, no carotid bruits Lungs: no use of accessory muscles, no dullness to percussion, decreased without rales or rhonchi  Cardiovascular:  Rhythm regular, heart sounds  normal, no murmurs or gallops, 2+ peripheral edema Musculoskeletal: No deformities, no cyanosis or clubbing , no tremors       Assessment & Plan:

## 2020-03-16 NOTE — Assessment & Plan Note (Signed)
Schedule split-night study, you may need Covid testing prior to this.  Take Lasix if your weight increases by 3 pounds.

## 2020-03-16 NOTE — Patient Instructions (Signed)
  Ambulatory saturation on 2 L pulse. Schedule split-night study, you may need Covid testing prior to this.  Take Lasix if your weight increases by 3 pounds.  Strongly encourage you to take Covid vaccine

## 2020-03-16 NOTE — Assessment & Plan Note (Signed)
Ambulatory saturation to confirm that 2 L pulse is enough oxygen for her. I encouraged her to get Covid vaccine. Doubt that she has true asthma but will continue Symbicort. I asked her to stop Perforomist, she has self discontinued Incruse already and that is okay

## 2020-03-25 ENCOUNTER — Telehealth: Payer: Self-pay | Admitting: Pulmonary Disease

## 2020-03-25 NOTE — Telephone Encounter (Signed)
Pt was returning my call - I had left messages for her to give her sleep study appt info.  I just called & left info on her vm as requested in her message.  Gave her the phone # for the sleep lab in case she has any questions or needs to reschedule appt.  Nothing further needed.

## 2020-04-11 ENCOUNTER — Inpatient Hospital Stay (HOSPITAL_COMMUNITY): Admission: RE | Admit: 2020-04-11 | Payer: Medicare Other | Source: Ambulatory Visit

## 2020-04-13 ENCOUNTER — Ambulatory Visit (HOSPITAL_BASED_OUTPATIENT_CLINIC_OR_DEPARTMENT_OTHER): Payer: Medicare Other | Attending: Pulmonary Disease | Admitting: Pulmonary Disease

## 2020-04-18 DIAGNOSIS — E782 Mixed hyperlipidemia: Secondary | ICD-10-CM | POA: Insufficient documentation

## 2020-04-18 HISTORY — DX: Mixed hyperlipidemia: E78.2

## 2020-05-16 ENCOUNTER — Encounter (HOSPITAL_BASED_OUTPATIENT_CLINIC_OR_DEPARTMENT_OTHER): Payer: Medicare Other | Admitting: Pulmonary Disease

## 2020-06-02 ENCOUNTER — Ambulatory Visit (HOSPITAL_BASED_OUTPATIENT_CLINIC_OR_DEPARTMENT_OTHER): Payer: Medicare Other | Attending: Pulmonary Disease | Admitting: Pulmonary Disease

## 2020-06-02 ENCOUNTER — Other Ambulatory Visit: Payer: Self-pay

## 2020-06-02 DIAGNOSIS — E669 Obesity, unspecified: Secondary | ICD-10-CM | POA: Insufficient documentation

## 2020-06-02 DIAGNOSIS — E662 Morbid (severe) obesity with alveolar hypoventilation: Secondary | ICD-10-CM | POA: Diagnosis present

## 2020-06-02 DIAGNOSIS — G4736 Sleep related hypoventilation in conditions classified elsewhere: Secondary | ICD-10-CM | POA: Insufficient documentation

## 2020-06-02 DIAGNOSIS — Z6841 Body Mass Index (BMI) 40.0 and over, adult: Secondary | ICD-10-CM | POA: Diagnosis not present

## 2020-06-02 DIAGNOSIS — Z79890 Hormone replacement therapy (postmenopausal): Secondary | ICD-10-CM | POA: Diagnosis not present

## 2020-06-02 DIAGNOSIS — J9611 Chronic respiratory failure with hypoxia: Secondary | ICD-10-CM | POA: Insufficient documentation

## 2020-06-02 DIAGNOSIS — G4733 Obstructive sleep apnea (adult) (pediatric): Secondary | ICD-10-CM | POA: Diagnosis not present

## 2020-06-02 DIAGNOSIS — Z79899 Other long term (current) drug therapy: Secondary | ICD-10-CM | POA: Diagnosis not present

## 2020-06-03 ENCOUNTER — Telehealth: Payer: Self-pay | Admitting: Pulmonary Disease

## 2020-06-03 DIAGNOSIS — R0683 Snoring: Secondary | ICD-10-CM

## 2020-06-03 DIAGNOSIS — G4734 Idiopathic sleep related nonobstructive alveolar hypoventilation: Secondary | ICD-10-CM | POA: Insufficient documentation

## 2020-06-03 DIAGNOSIS — E662 Morbid (severe) obesity with alveolar hypoventilation: Secondary | ICD-10-CM

## 2020-06-03 DIAGNOSIS — G4736 Sleep related hypoventilation in conditions classified elsewhere: Secondary | ICD-10-CM

## 2020-06-03 DIAGNOSIS — G4733 Obstructive sleep apnea (adult) (pediatric): Secondary | ICD-10-CM

## 2020-06-03 DIAGNOSIS — E119 Type 2 diabetes mellitus without complications: Secondary | ICD-10-CM

## 2020-06-03 HISTORY — DX: Idiopathic sleep related nonobstructive alveolar hypoventilation: G47.34

## 2020-06-03 NOTE — Telephone Encounter (Signed)
Study showed moderate OSA especially during REM sleep  Please ensure that she has CPAP machine to get back on this Based on previous titration in 2019, she can be on autoCPAP 10-20 cm with 3 L O2 blended in

## 2020-06-03 NOTE — Procedures (Signed)
Patient Name: Chelsea [name], Chelsea [name] Date: 06/02/2020 Gender: Female D.O.B: April 22, 1966 Age (years): 22 Referring Provider: Kara Mead MD, ABSM Height (inches): 65 Interpreting Physician: Kara Mead MD, ABSM Weight (lbs): 332 RPSGT: Laren Everts BMI: 55 MRN: [id number] Neck Size: 19.00 <br> <br> CLINICAL INFORMATION Sleep Study Type: NPSG    Indication for sleep study: Diabetes, Obesity, Snoring to reassess OSA PSG 11/2010 >> (322 lbs) Severe >> CPAP of 18 cm with a C-Flex of 3 cm with 2 L of oxygen blended in >> did not tolerate  PSG 07/2011>>bipap 20/14 sm ff mask h/h biflex+3cm     Most recent titration study dated 08/05/2018 was optimal at 20cm H2O with an AHI of 3.8/h.    Epworth Sleepiness Score: 5     SLEEP STUDY TECHNIQUE As per the AASM Manual for the Scoring of Sleep and Associated Events v2.3 (April 2016) with a hypopnea requiring 4% desaturations.  The channels recorded and monitored were frontal, central and occipital EEG, electrooculogram (EOG), submentalis EMG (chin), nasal and oral airflow, thoracic and abdominal wall motion, anterior tibialis EMG, snore microphone, electrocardiogram, and pulse oximetry.  MEDICATIONS Medications self-administered by patient taken the night of the study : GABAPENTIN, LEVOTHYROXINE, ATORVASTATIN, TRAMADOL  SLEEP ARCHITECTURE The study was initiated at 11:08:12 PM and ended at 5:37:14 AM.  Sleep onset time was 54.8 minutes and the sleep efficiency was 51.3%%. The total sleep time was 199.7 minutes.  Stage REM latency was 189.0 minutes.  The patient spent 4.3%% of the night in stage N1 sleep, 70.5%% in stage N2 sleep, 0.0%% in stage N3 and 25.3% in REM.  Alpha intrusion was absent.  Supine sleep was 0.00%.  RESPIRATORY PARAMETERS The overall apnea/hypopnea index (AHI) was 17.7 per hour. There were 25 total apneas, including 25 obstructive, 0 central and 0 mixed apneas. There were 34 hypopneas and 4 RERAs. Events &  destaurations were mostly noted during REM sleep  The AHI during Stage REM sleep was 53.5 per hour.  AHI while supine was N/A per hour.  The mean oxygen saturation was 92.1%. The minimum SpO2 during sleep was 58.0%.Study was performed on 2 L O2  moderate snoring was noted during this study.  CARDIAC DATA The 2 lead EKG demonstrated sinus rhythm. The mean heart rate was 77.7 beats per minute. Other EKG findings include: None. LEG MOVEMENT DATA The total PLMS were 0 with a resulting PLMS index of 0.0. Associated arousal with leg movement index was 0.0 .  IMPRESSIONS - Moderate obstructive sleep apnea occurred during this study (AHI = 17.7/h), mostly REM related - No significant central sleep apnea occurred during this study (CAI = 0.0/h). - Severe oxygen desaturation was noted during this study (Min O2 = 58.0%). - The patient snored with moderate snoring volume. - No cardiac abnormalities were noted during this study. - Clinically significant periodic limb movements did not occur during sleep. No significant associated arousals.   DIAGNOSIS - Obstructive Sleep Apnea (G47.33) - Nocturnal Hypoxemia (G47.36) . Study was performed on 2 L O2   RECOMMENDATIONS - Therapeutic CPAP titration to determine optimal pressure required to alleviate sleep disordered breathing.Based on previous titration in 2019, she can be on autoCPAP 10-20 cm with 2-3 L O2 blended in - Avoid alcohol, sedatives and other CNS depressants that may worsen sleep apnea and disrupt normal sleep architecture. - Sleep hygiene should be reviewed to assess factors that may improve sleep quality. - Weight management and regular exercise should be initiated or continued if appropriate.  Davonna Belling  Vernadine Coombs MD Board Certified in Sleep medicine   

## 2020-06-03 NOTE — Telephone Encounter (Signed)
Pt states she no longer has machine doesn't know how long she had before she lost it. But needs a new one.

## 2020-06-10 NOTE — Telephone Encounter (Signed)
Please send prescription to DME for autoCPAP 10-20 cm with 3 L O2 blended in  Office visit with me/APP in 6 weeks

## 2020-06-13 NOTE — Telephone Encounter (Signed)
Attempted to call patient to discuss ordering new CPAP, about placing Cpap orders and to schedule a follow up visit but patient did not answer phone. Left message on voicemail to please return phone call. Contact number left.

## 2020-06-14 NOTE — Telephone Encounter (Addendum)
Attempted to call patient today and left message on voicemail to please call us back. Contact number provided.

## 2020-06-15 ENCOUNTER — Telehealth: Payer: Self-pay

## 2020-06-15 NOTE — Telephone Encounter (Signed)
Sent letter to patient to please call [facility] so we can get her set up on Cpap and order if need a new one and to schedule follow up appointment with Dr Elsworth Soho. We have attempted to call/reach patient several times with no success.

## 2020-06-21 ENCOUNTER — Telehealth: Payer: Self-pay | Admitting: Pulmonary Disease

## 2020-06-21 NOTE — Telephone Encounter (Signed)
Please send prescription to DME for autoCPAP 10-20 cm with 3 L O2 blended in  Office visit with me/APP in 6 weeks  Tried calling pt and there was no answer- LMTCB

## 2020-06-23 NOTE — Telephone Encounter (Signed)
lmtcb for pt. It has been difficult to reach pt.  When pt calls back she needs to speak to a nurse, do not put message back.

## 2020-06-29 ENCOUNTER — Encounter: Payer: Self-pay | Admitting: *Deleted

## 2020-06-29 NOTE — Telephone Encounter (Signed)
Attempted to call pt but unable to reach. Left message for her to return call. Due to multiple attempts trying to reach pt and unable to do so, letter will be mailed to pt's address and encounter will be closed.

## 2020-07-18 ENCOUNTER — Telehealth: Payer: Self-pay | Admitting: Pulmonary Disease

## 2020-07-18 DIAGNOSIS — G4733 Obstructive sleep apnea (adult) (pediatric): Secondary | ICD-10-CM

## 2020-07-18 NOTE — Telephone Encounter (Signed)
Please send prescription to DME for autoCPAP 10-20 cm with 3 L O2 blended in  Office visit with me/APP in 6 weeks  Tried calling pt and there was no answer- LMTCB   Spoke with pt and notified results, ordered CPAP and scheduled f/u

## 2020-09-16 ENCOUNTER — Ambulatory Visit: Payer: Medicare Other | Admitting: Primary Care

## 2020-09-16 NOTE — Progress Notes (Deleted)
@Patient  ID: Chelsea [name], female    DOB: [birth year], 54 y.o.   MRN: [id number]  No chief complaint on file.   Referring provider: Rudene Anda, MD  HPI: 54 year old female, never smoked.  Past medical history significant for obstructive sleep apnea/obesity hypoventilation syndrome, chronic respiratory failure with hypoxia, aortic stenosis, chronic diastolic heart failure, hypertension, GERD, diabetes, morbid obesity.  Patient of Dr. Elsworth Soho, last seen 03/16/20.  On CPAP and O2 since 2004 after cervical CA surgery.  Patient not felt to have true asthma we will continue Symbicort for now.  Patient was asked to stop Perforomist and self discontinued Incruse.  Continue oxygen 2 L pulsed.  09/16/2020 Patient presents today for 29-month follow-up.      Significant tests/ events reviewed  1/2012admwith pneumonia and respiratory failure requiring mech ventilation.   PSG 11/2010 >>(322 lbs)Severe>>CPAP of 18 cm with a C-Flex of 3 cm with 2 L of oxygen blended in >> did not tolerate   PSG 07/2011>>bipap 20/14 sm ff mask h/h biflex+3cm   PFT 07/2016 >>No airway obstruction with a ratio of 80,severe restriction with FVC 41%,DLCO not accurate but corrects for alveolar volume   Allergies  Allergen Reactions  . Penicillins Anaphylaxis    Patient tolerates cefepime (08/2017)  . Minoxidil     Immunization History  Administered Date(s) Administered  . Influenza Split 08/15/2012  . Influenza Whole 08/07/2010, 07/02/2011  . Influenza,inj,Quad PF,6+ Mos 07/14/2013, 08/19/2015, 07/04/2016, 07/02/2018  . Influenza,inj,quad, With Preservative 07/28/2017  . Pneumococcal Polysaccharide-23 09/30/2015    Past Medical History:  Diagnosis Date  . Aortic stenosis    Echocardiogram 02/21/11:  Mean gradient 23 mm of mercury; peak gradient 36;; Turbulence noted in the area of the aortic isthmus with peak gradient 23 mmHg-consider MRI to assess for coarctation;    TEE: 04/02/11:  EF  55-60%, mod BAE, trileaflet AV with mild AS (pk and mean 25 and 16), mild AI, mild MR, mild TR, atrial septal aneurysm, no evidence of corarctation  . Arthritis    "qwhere" (12/05/2017)  . Asthma   . Cervical cancer ([place]) 2006  . CHF (congestive heart failure) ([place])   . Chronic lower back pain   . Complication of anesthesia    "I have a hard time waking up from under it" (12/05/2017)  . COPD (chronic obstructive pulmonary disease) ([place])    "lung dr said I don't have this" (12/05/2017)  . Coronary artery disease   . Diastolic congestive heart failure ([place])    Echo 02/21/11: EF 55-60%, moderate LVH, grade 2 diastolic dysfunction, mild to moderate aortic stenosis with a mean gradient 23 mm of mercury  . GERD (gastroesophageal reflux disease)   . Heart murmur   . Hepatitis C   . History of gout X 1  . Hyperlipidemia   . Hypertension   . Hypothyroid   . IBS (irritable bowel syndrome)   . Migraine    "monthly" (12/05/2017)  . Obesity hypoventilation syndrome ([place])   . On home oxygen therapy    "2L all the time" (12/05/2017)  . OSA treated with BiPAP    "have CPAP at home too; wearing BiPAP right now" (12/05/2017)  . Pneumonia    "several times" (12/05/2017)  . Type II diabetes mellitus (HCC)     Tobacco History: Social History   Tobacco Use  Smoking Status Never Smoker  Smokeless Tobacco Never Used   Counseling given: Not Answered   Outpatient Medications Prior to Visit  Medication Sig Dispense Refill  .  acetaminophen (TYLENOL) 500 MG tablet Take by mouth.    Marland Kitchen albuterol (PROVENTIL) (2.5 MG/3ML) 0.083% nebulizer solution Take 3 mLs (2.5 mg total) by nebulization every 6 (six) hours as needed for wheezing or shortness of breath (I50.32). 75 mL 12  . albuterol (VENTOLIN HFA) 108 (90 BASE) MCG/ACT inhaler Inhale 2 puffs into the lungs every 6 (six) hours as needed for wheezing. 1 Inhaler 5  . amLODipine (NORVASC) 5 MG tablet Take 5 mg by mouth daily. (Patient not taking: Reported on  03/16/2020)  2  . aspirin 81 MG tablet Take 81 mg by mouth daily.      Marland Kitchen atorvastatin (LIPITOR) 20 MG tablet     . budesonide-formoterol (SYMBICORT) 160-4.5 MCG/ACT inhaler Inhale into the lungs.    . cetirizine (ZYRTEC) 10 MG tablet Take by mouth. (Patient not taking: Reported on 03/16/2020)    . ferrous sulfate 325 (65 FE) MG tablet Take by mouth.    . furosemide (LASIX) 80 MG tablet Take 1 tablet (80 mg total) by mouth 2 (two) times daily. 60 tablet 1  . gabapentin (NEURONTIN) 300 MG capsule Take 300 mg by mouth 2 (two) times daily. (Patient not taking: Reported on 03/16/2020)    . gabapentin (NEURONTIN) 400 MG capsule TAKE 1 CAPSULE BY MOUTH THREE TIMES A DAY  0  . Ginkgo Biloba (GINKOBA PO) Take by mouth.    Marland Kitchen glimepiride (AMARYL) 2 MG tablet Take by mouth. (Patient not taking: Reported on 03/16/2020)    . glucosamine-chondroitin 500-400 MG tablet Take 1 tablet by mouth 2 (two) times daily.      Marland Kitchen levothyroxine (SYNTHROID, LEVOTHROID) 112 MCG tablet Take 112 mcg by mouth daily.  3  . levothyroxine (SYNTHROID, LEVOTHROID) 150 MCG tablet TAKE 1 TABLET EVERY DAY 90 tablet 2  . Lidocaine-Menthol 3.6-1.25 % PTCH Place onto the skin. (Patient not taking: Reported on 03/16/2020)    . loperamide (IMODIUM) 2 MG capsule Take by mouth.    . losartan (COZAAR) 25 MG tablet Take 25 mg by mouth daily.     . metFORMIN (GLUCOPHAGE-XR) 500 MG 24 hr tablet Take 1 tablet by mouth 2 (two) times daily.    . Misc. Devices (BARIATRIC ROLLATOR) MISC 1 each by Does not apply route daily. 1 each 0  . Multiple Vitamin (MULTIVITAMIN) tablet Take 1 tablet by mouth daily.      . NON FORMULARY Oxygen 3 liters and cpap    . potassium chloride SA (K-DUR,KLOR-CON) 20 MEQ tablet Take by mouth.    . TRADJENTA 5 MG TABS tablet Take 5 mg by mouth daily. (Patient not taking: Reported on 03/16/2020)    . traMADol (ULTRAM) 50 MG tablet Take 50 mg by mouth 3 (three) times daily as needed.     No facility-administered medications prior  to visit.      Review of Systems  Review of Systems   Physical Exam  There were no vitals taken for this visit. Physical Exam   Lab Results:  CBC    Component Value Date/Time   WBC 9.1 12/04/2017 1941   RBC 4.24 12/04/2017 1941   HGB 12.5 12/04/2017 1941   HCT 41.0 12/04/2017 1941   PLT 194 12/04/2017 1941   MCV 96.7 12/04/2017 1941   MCH 29.5 12/04/2017 1941   MCHC 30.5 12/04/2017 1941   RDW 13.3 12/04/2017 1941   LYMPHSABS 2.7 08/15/2012 1227   MONOABS 0.6 08/15/2012 1227   EOSABS 0.2 08/15/2012 1227   BASOSABS 0.0 08/15/2012 1227  BMET    Component Value Date/Time   NA 140 07/02/2018 1538   K 4.0 07/02/2018 1538   CL 94 (L) 07/02/2018 1538   CO2 36 (H) 07/02/2018 1538   GLUCOSE 94 07/02/2018 1538   BUN 24 (H) 07/02/2018 1538   CREATININE 1.07 07/02/2018 1538   CALCIUM 10.0 07/02/2018 1538   GFRNONAA >60 12/10/2017 0221   GFRAA >60 12/10/2017 0221    BNP    Component Value Date/Time   BNP 48.5 12/04/2017 1941    ProBNP    Component Value Date/Time   PROBNP 37.0 06/05/2016 1642    Imaging: No results found.   Assessment & Plan:   No problem-specific Assessment & Plan notes found for this encounter.     Martyn Ehrich, NP 09/16/2020

## 2021-02-09 DIAGNOSIS — R0609 Other forms of dyspnea: Secondary | ICD-10-CM | POA: Insufficient documentation

## 2021-02-09 DIAGNOSIS — IMO0001 Reserved for inherently not codable concepts without codable children: Secondary | ICD-10-CM | POA: Insufficient documentation

## 2021-02-09 DIAGNOSIS — R0600 Dyspnea, unspecified: Secondary | ICD-10-CM | POA: Insufficient documentation

## 2021-02-09 DIAGNOSIS — R911 Solitary pulmonary nodule: Secondary | ICD-10-CM | POA: Insufficient documentation

## 2021-02-09 HISTORY — DX: Other forms of dyspnea: R06.09

## 2021-02-19 DIAGNOSIS — Z90710 Acquired absence of both cervix and uterus: Secondary | ICD-10-CM

## 2021-02-19 HISTORY — DX: Acquired absence of both cervix and uterus: Z90.710

## 2021-03-02 ENCOUNTER — Encounter: Payer: Self-pay | Admitting: Pulmonary Disease

## 2021-03-02 ENCOUNTER — Ambulatory Visit (INDEPENDENT_AMBULATORY_CARE_PROVIDER_SITE_OTHER): Payer: Medicare Other | Admitting: Pulmonary Disease

## 2021-03-02 ENCOUNTER — Other Ambulatory Visit: Payer: Self-pay

## 2021-03-02 DIAGNOSIS — I5032 Chronic diastolic (congestive) heart failure: Secondary | ICD-10-CM

## 2021-03-02 DIAGNOSIS — J9611 Chronic respiratory failure with hypoxia: Secondary | ICD-10-CM

## 2021-03-02 DIAGNOSIS — J40 Bronchitis, not specified as acute or chronic: Secondary | ICD-10-CM | POA: Diagnosis not present

## 2021-03-02 DIAGNOSIS — G4733 Obstructive sleep apnea (adult) (pediatric): Secondary | ICD-10-CM

## 2021-03-02 NOTE — Assessment & Plan Note (Signed)
She had subjective improvement with prednisone so may have had a component of acute bronchitis.  She does not have COPD, lifetime never smoker, has only been maintained on Symbicort due to subjective improvement

## 2021-03-02 NOTE — Assessment & Plan Note (Signed)
She was provided with a new auto CPAP machine in 2021, strangely [employer] does not show compliance beyond February.  She does claim compliance by report.  This may be related to faulty upload of data.  We will reach out to DME to obtain a more recent download

## 2021-03-02 NOTE — Patient Instructions (Signed)
  Ambulatory saturation on oxygen  Will restart Lasix at least 3 times a week -Monday/Wednesday/Friday, weigh yourself 3 times a week and if weight increases by 5 pounds (335)  may need an extra dose  CPAP download will be obtained from [place] patient

## 2021-03-02 NOTE — Progress Notes (Signed)
Subjective:    Patient ID: Chelsea [name], female    DOB: Nov 12, 1965, 55 y.o.   MRN: [id number]  HPI   55 yo never smoker, morbidly obese for FU of obstructive sleep apnea/ OHS &chronic hypoxic respiratory failureOnCPAP &O2 since 2004 after cervical CA surgery.    PMH - HFpEF ,She had an episode of mechanical ventilation in 2012.  moderate aortic stenosis  Chief Complaint  Patient presents with  . Follow-up    Recently diagnosed with restrictive lung disease at hospital. Having issue with oxygen dropping to high 70's to low 80's.    Last office visit 03/2020 reviewed. She had a sleep study 06/2020 which confirm moderate OSA and prescription for new auto CPAP 10 to 20 cm with 3 L oxygen blended was sent .  She reports compliance with this machine. She was hospitalized for 2 days at [facility] for increased shortness of breath and hypoxia she states that her leg swelling was no worse. She underwent evaluation including CT chest and echo and was treated for "COPD exacerbation" with prednisone and antibiotics.  On discharge her oxygen saturation had improved  She has questions about prescription for a ramp at her house. She has stopped taking her Lasix and only takes it on an as-needed basis, feels that her legs are doing better. Accompanied by her husband who corroborates history  Records were reviewed in care everywhere  Significant tests/ events reviewed  CT chest without contrast 01/2021 patchy groundglass opacities bilateral, 6 mm calcified and noncalcified nodules. Echo 01/2021 normal LVEF dilated left atrium, unable to estimate RVSP  1/2012admwith pneumonia and respiratory failure requiring mech ventilation.    06/2020 NPSG >>  (332 lbs ) mod OSA , AHI 18/h, lowest desat 58 %, esp during REM sleep PSG 11/2010 >>(322 lbs)Severe>>CPAP of 18 cm with a C-Flex of 3 cm with 2 L of oxygen blended in >> did not tolerate   PSG 07/2011>>bipap 20/14 sm ff  mask h/h biflex+3cm   PFT 07/2016 >>No airway obstruction with a ratio of 80,severe restriction with FVC 41%,DLCO not accurate but corrects for alveolar volume  Past Medical History:  Diagnosis Date  . Aortic stenosis    Echocardiogram 02/21/11:  Mean gradient 23 mm of mercury; peak gradient 36;; Turbulence noted in the area of the aortic isthmus with peak gradient 23 mmHg-consider MRI to assess for coarctation;    TEE: 04/02/11:  EF 55-60%, mod BAE, trileaflet AV with mild AS (pk and mean 25 and 16), mild AI, mild MR, mild TR, atrial septal aneurysm, no evidence of corarctation  . Arthritis    "qwhere" (12/05/2017)  . Asthma   . Cervical cancer ([place]) 2006  . CHF (congestive heart failure) ([place])   . Chronic lower back pain   . Complication of anesthesia    "I have a hard time waking up from under it" (12/05/2017)  . COPD (chronic obstructive pulmonary disease) ([place])    "lung dr said I don't have this" (12/05/2017)  . Coronary artery disease   . Diastolic congestive heart failure ([place])    Echo 02/21/11: EF 55-60%, moderate LVH, grade 2 diastolic dysfunction, mild to moderate aortic stenosis with a mean gradient 23 mm of mercury  . GERD (gastroesophageal reflux disease)   . Heart murmur   . Hepatitis C   . History of gout X 1  . Hyperlipidemia   . Hypertension   . Hypothyroid   . IBS (irritable bowel syndrome)   . Migraine    "  monthly" (12/05/2017)  . Obesity hypoventilation syndrome ([place])   . On home oxygen therapy    "2L all the time" (12/05/2017)  . OSA treated with BiPAP    "have CPAP at home too; wearing BiPAP right now" (12/05/2017)  . Pneumonia    "several times" (12/05/2017)  . Type II diabetes mellitus (HCC)      Review of Systems neg for any significant sore throat, dysphagia, itching, sneezing, nasal congestion or excess/ purulent secretions, fever, chills, sweats, unintended wt loss, pleuritic or exertional cp, hempoptysis, orthopnea pnd or change in chronic leg swelling. Also  denies presyncope, palpitations, heartburn, abdominal pain, nausea, vomiting, diarrhea or change in bowel or urinary habits, dysuria,hematuria, rash, arthralgias, visual complaints, headache, numbness weakness or ataxia.     Objective:   Physical Exam  Gen. Pleasant, obese, in no distress, on [facility] - no lesions, no post nasal drip Neck: No JVD, no thyromegaly, no carotid bruits Lungs: no use of accessory muscles, no dullness to percussion, decreased without rales or rhonchi  Cardiovascular: Rhythm regular, heart sounds  normal, no murmurs or gallops, n2+ peripheral edema Musculoskeletal: No deformities, no cyanosis or clubbing , no tremors       Assessment & Plan:

## 2021-03-02 NOTE — Assessment & Plan Note (Signed)
She has about a 10 pound weight gain compared to her baseline 322 pounds, I have asked her to get back on Lasix 80 mg 3 times a week and monitor her weight at least 3 times a week

## 2021-03-02 NOTE — Assessment & Plan Note (Addendum)
Ambulatory saturation to decide how much oxygen she needs during ambulation > 3 L pulse

## 2021-03-08 ENCOUNTER — Telehealth: Payer: Self-pay | Admitting: Cardiology

## 2021-03-08 NOTE — Telephone Encounter (Signed)
Received direct stat call into triage from patient with complaint of heart rate in the 150's. Patient denies any symptoms.   Patient has not been seen by Dr. [name] or anyone in office since 2014, and therefore is now a new patient.   Advised patient if concerned about symptoms or develop symptoms to report to the ER for evaluation and or call PCP/Pulmonologist.   Patient has upcoming appointment on 6/20 with Coletta Memos NP but due to not being seen since 2014 patient will need a new patient appointment with MD in office.   Patient made aware and verbalized understanding.

## 2021-03-08 NOTE — Telephone Encounter (Signed)
STAT if HR is under 50 or over 120 (normal HR is 60-100 beats per minute)  1) What is your heart rate? 153  2) Do you have a log of your heart rate readings (document readings)? 164  3) Do you have any other symptoms? No   Patient states her HR has been very high and is 153 now. She states her BP was fine and she is on oxygen all the time.

## 2021-03-09 DIAGNOSIS — I4892 Unspecified atrial flutter: Secondary | ICD-10-CM

## 2021-03-09 HISTORY — DX: Unspecified atrial flutter: I48.92

## 2021-03-09 NOTE — Progress Notes (Deleted)
Cardiology Office Note:    Date:  03/09/2021   ID:  Chelsea [name], Chelsea [name] 14-May-1966, MRN [id number]  PCP:  Rudene Anda, MD  Cardiologist:  Shirlee More, MD    Referring MD: Rudene Anda, MD    ASSESSMENT:    No diagnosis found. PLAN:    In order of problems listed above:     Next appointment: ***   Medication Adjustments/Labs and Tests Ordered: Current medicines are reviewed at length with the patient today.  Concerns regarding medicines are outlined above.  No orders of the defined types were placed in this encounter.  No orders of the defined types were placed in this encounter.   No chief complaint on file.   History of Present Illness:    Chelsea [name] is a 55 y.o. female with a hx of diastolic heart failure, COPD obstructive sleep apnea hypertension diabetes and hyperlipidemia last seen 03/14/2011.  Compliance with diet, lifestyle and medications: ***  Echocardiogram performed 12/06/2017 showing moderate concentric LVH systolic function was described as vigorous EF 65 to 70% with abnormal septal motion moderate aortic stenosis with a mean gradient of 21 mmHg mild left and mild right atrial enlargement.--.23-  She had a recent echocardiogram at [facility] 02/10/2021 showing EF 60 to 65% moderate left and mild right atrial enlargement aortic stenosis with a mean gradient of 15 mmHg.  CT of chest 02/09/2021 showed patchy areas of groundglass density throughout the bilateral lung fields atelectasis calcified and noncalcified pulmonary nodules cardiomegaly and dilated ascending thoracic aorta 41 mm.  She was admitted to [facility] for 2 days 02/08/2021 with hypoxia Home oxygen saturation 82% on baseline 2 L of oxygen peripheral edema weight gain of 40 pounds noncompliance with diuretic and increasing shortness of breath she had an acute exacerbation of COPD with hypoxic respiratory failure and hyperal ventilation she was treated as an acute COPD  exacerbation laboratory studies showed a hemoglobin of 12.8 platelets 233,000 potassium 3.6 creatinine 0.78 GFR 89 cc/min.  Past Medical History:  Diagnosis Date   Aortic stenosis    Echocardiogram 02/21/11:  Mean gradient 23 mm of mercury; peak gradient 36;; Turbulence noted in the area of the aortic isthmus with peak gradient 23 mmHg-consider MRI to assess for coarctation;    TEE: 04/02/11:  EF 55-60%, mod BAE, trileaflet AV with mild AS (pk and mean 25 and 16), mild AI, mild MR, mild TR, atrial septal aneurysm, no evidence of corarctation   Arthritis    "qwhere" (12/05/2017)   Asthma    Cervical cancer ([place]) 2006   CHF (congestive heart failure) (HCC)    Chronic lower back pain    Complication of anesthesia    "I have a hard time waking up from under it" (12/05/2017)   COPD (chronic obstructive pulmonary disease) ([place])    "lung dr said I don't have this" (12/05/2017)   Coronary artery disease    Diastolic congestive heart failure ([place])    Echo 02/21/11: EF 55-60%, moderate LVH, grade 2 diastolic dysfunction, mild to moderate aortic stenosis with a mean gradient 23 mm of mercury   GERD (gastroesophageal reflux disease)    Heart murmur    Hepatitis C    History of gout X 1   Hyperlipidemia    Hypertension    Hypothyroid    IBS (irritable bowel syndrome)    Migraine    "monthly" (12/05/2017)   Obesity hypoventilation syndrome ([place])    On home oxygen therapy    "2L  all the time" (12/05/2017)   OSA treated with BiPAP    "have CPAP at home too; wearing BiPAP right now" (12/05/2017)   Pneumonia    "several times" (12/05/2017)   Type II diabetes mellitus ([place])     Past Surgical History:  Procedure Laterality Date   ABDOMINAL SURGERY  2006 X 2   "TAH incision was infected"   CHOLECYSTECTOMY OPEN     TONSILLECTOMY     TOTAL ABDOMINAL HYSTERECTOMY  2006    Current Medications: No outpatient medications have been marked as taking for the 03/10/21 encounter (Appointment) with Richardo Priest, MD.      Allergies:   Penicillins and Minoxidil   Social History   Socioeconomic History   Marital status: Divorced    Spouse name: Not on file   Number of children: Not on file   Years of education: Not on file   Highest education level: Not on file  Occupational History   Occupation: disabled  Tobacco Use   Smoking status: Never   Smokeless tobacco: Never  Vaping Use   Vaping Use: Never used  Substance and Sexual Activity   Alcohol use: Yes    Comment: 12/05/2017 "1-2 drinks/year"   Drug use: No   Sexual activity: Not Currently  Other Topics Concern   Not on file  Social History Narrative   Not on file   Social Determinants of Health   Financial Resource Strain: Not on file  Food Insecurity: Not on file  Transportation Needs: Not on file  Physical Activity: Not on file  Stress: Not on file  Social Connections: Not on file     Family History: The patient's ***family history includes Heart disease in her father. ROS:   Please see the history of present illness.    All other systems reviewed and are negative.  EKGs/Labs/Other Studies Reviewed:    The following studies were reviewed today:  EKG:  EKG ordered today and personally reviewed.  The ekg ordered today demonstrates ***  Recent Labs: No results found for requested labs within last 8760 hours.  Recent Lipid Panel No results found for: CHOL, TRIG, HDL, CHOLHDL, VLDL, LDLCALC, LDLDIRECT  Physical Exam:    VS:  There were no vitals taken for this visit.    Wt Readings from Last 3 Encounters:  03/02/21 (!) 339 lb 12.8 oz (154.1 kg)  06/02/20 (!) 332 lb (150.6 kg)  03/16/20 (!) 339 lb 6.4 oz (154 kg)     GEN: *** Well nourished, well developed in no acute distress HEENT: Normal NECK: No JVD; No carotid bruits LYMPHATICS: No lymphadenopathy CARDIAC: ***RRR, no murmurs, rubs, gallops RESPIRATORY:  Clear to auscultation without rales, wheezing or rhonchi  ABDOMEN: Soft, non-tender,  non-distended MUSCULOSKELETAL:  No edema; No deformity  SKIN: Warm and dry NEUROLOGIC:  Alert and oriented x 3 PSYCHIATRIC:  Normal affect    Signed, Shirlee More, MD  03/09/2021 1:03 PM    [facility]

## 2021-03-10 ENCOUNTER — Ambulatory Visit: Payer: Medicare Other | Admitting: Cardiology

## 2021-03-15 DIAGNOSIS — Z8679 Personal history of other diseases of the circulatory system: Secondary | ICD-10-CM

## 2021-03-15 DIAGNOSIS — R634 Abnormal weight loss: Secondary | ICD-10-CM

## 2021-03-15 HISTORY — DX: Personal history of other diseases of the circulatory system: Z86.79

## 2021-03-15 HISTORY — DX: Abnormal weight loss: R63.4

## 2021-03-18 DIAGNOSIS — I5033 Acute on chronic diastolic (congestive) heart failure: Secondary | ICD-10-CM | POA: Insufficient documentation

## 2021-03-18 DIAGNOSIS — I509 Heart failure, unspecified: Secondary | ICD-10-CM | POA: Insufficient documentation

## 2021-03-18 HISTORY — DX: Heart failure, unspecified: I50.9

## 2021-03-20 ENCOUNTER — Ambulatory Visit: Payer: Medicare Other | Admitting: General Practice

## 2021-03-20 ENCOUNTER — Ambulatory Visit: Payer: Medicare Other | Admitting: Cardiology

## 2021-03-20 DIAGNOSIS — R531 Weakness: Secondary | ICD-10-CM | POA: Insufficient documentation

## 2021-03-20 HISTORY — DX: Weakness: R53.1

## 2021-03-22 DIAGNOSIS — G2581 Restless legs syndrome: Secondary | ICD-10-CM

## 2021-03-22 HISTORY — DX: Restless legs syndrome: G25.81

## 2021-03-28 DIAGNOSIS — Z751 Person awaiting admission to adequate facility elsewhere: Secondary | ICD-10-CM | POA: Insufficient documentation

## 2021-03-28 DIAGNOSIS — E873 Alkalosis: Secondary | ICD-10-CM

## 2021-03-28 HISTORY — DX: Alkalosis: E87.3

## 2021-04-02 DIAGNOSIS — Z789 Other specified health status: Secondary | ICD-10-CM | POA: Insufficient documentation

## 2021-04-02 DIAGNOSIS — I058 Other rheumatic mitral valve diseases: Secondary | ICD-10-CM | POA: Insufficient documentation

## 2021-04-02 DIAGNOSIS — I071 Rheumatic tricuspid insufficiency: Secondary | ICD-10-CM

## 2021-04-02 HISTORY — DX: Rheumatic tricuspid insufficiency: I07.1

## 2021-04-02 HISTORY — DX: Other rheumatic mitral valve diseases: I05.8

## 2021-04-02 HISTORY — DX: Other specified health status: Z78.9

## 2021-04-02 HISTORY — DX: Morbid (severe) obesity due to excess calories: E66.01

## 2021-04-20 ENCOUNTER — Encounter: Payer: Self-pay | Admitting: Cardiology

## 2021-04-20 ENCOUNTER — Other Ambulatory Visit: Payer: Self-pay

## 2021-04-20 ENCOUNTER — Ambulatory Visit (INDEPENDENT_AMBULATORY_CARE_PROVIDER_SITE_OTHER): Payer: Medicare Other | Admitting: Cardiology

## 2021-04-20 VITALS — BP 93/60 | HR 69 | Ht 65.0 in | Wt 361.8 lb

## 2021-04-20 DIAGNOSIS — E1165 Type 2 diabetes mellitus with hyperglycemia: Secondary | ICD-10-CM | POA: Insufficient documentation

## 2021-04-20 DIAGNOSIS — Z9981 Dependence on supplemental oxygen: Secondary | ICD-10-CM | POA: Insufficient documentation

## 2021-04-20 DIAGNOSIS — I509 Heart failure, unspecified: Secondary | ICD-10-CM | POA: Insufficient documentation

## 2021-04-20 DIAGNOSIS — I350 Nonrheumatic aortic (valve) stenosis: Secondary | ICD-10-CM

## 2021-04-20 DIAGNOSIS — Z6841 Body Mass Index (BMI) 40.0 and over, adult: Secondary | ICD-10-CM

## 2021-04-20 DIAGNOSIS — I10 Essential (primary) hypertension: Secondary | ICD-10-CM

## 2021-04-20 DIAGNOSIS — E782 Mixed hyperlipidemia: Secondary | ICD-10-CM

## 2021-04-20 DIAGNOSIS — Z87891 Personal history of nicotine dependence: Secondary | ICD-10-CM | POA: Insufficient documentation

## 2021-04-20 DIAGNOSIS — F32A Depression, unspecified: Secondary | ICD-10-CM | POA: Insufficient documentation

## 2021-04-20 DIAGNOSIS — Z789 Other specified health status: Secondary | ICD-10-CM

## 2021-04-20 DIAGNOSIS — N289 Disorder of kidney and ureter, unspecified: Secondary | ICD-10-CM | POA: Insufficient documentation

## 2021-04-20 DIAGNOSIS — G4733 Obstructive sleep apnea (adult) (pediatric): Secondary | ICD-10-CM

## 2021-04-20 DIAGNOSIS — R809 Proteinuria, unspecified: Secondary | ICD-10-CM | POA: Insufficient documentation

## 2021-04-20 DIAGNOSIS — I4892 Unspecified atrial flutter: Secondary | ICD-10-CM | POA: Diagnosis not present

## 2021-04-20 DIAGNOSIS — K581 Irritable bowel syndrome with constipation: Secondary | ICD-10-CM | POA: Insufficient documentation

## 2021-04-20 HISTORY — DX: Type 2 diabetes mellitus with hyperglycemia: E11.65

## 2021-04-20 HISTORY — DX: Personal history of nicotine dependence: Z87.891

## 2021-04-20 HISTORY — DX: Heart failure, unspecified: I50.9

## 2021-04-20 HISTORY — DX: Nonrheumatic aortic (valve) stenosis: I35.0

## 2021-04-20 HISTORY — DX: Disorder of kidney and ureter, unspecified: N28.9

## 2021-04-20 HISTORY — DX: Irritable bowel syndrome with constipation: K58.1

## 2021-04-20 HISTORY — DX: Proteinuria, unspecified: R80.9

## 2021-04-20 HISTORY — DX: Other specified health status: Z78.9

## 2021-04-20 NOTE — Patient Instructions (Signed)

## 2021-04-20 NOTE — Progress Notes (Signed)
Cardiology Office Note:    Date:  04/20/2021   ID:  Chelsea [name], Chelsea [name] August 05, 1966, MRN [id number]  PCP:  Rudene Anda, MD  Cardiologist:  Jenean Lindau, MD   Referring MD: Rudene Anda, MD    ASSESSMENT:    1. Essential hypertension   2. Atrial flutter, unspecified type ([place])   3. Mixed hyperlipidemia   4. Essential (primary) hypertension   5. Obstructive sleep apnea   6. Morbid obesity with BMI of 50.0-59.9, adult ([place])    PLAN:    In order of problems listed above:  Paroxysmal atrial flutter and fibrillation: I reviewed records from the other cardiologist who has seen her.  I concur with him.  She is on high-dose of amiodarone and she is a young lady.  She has been recommended ablation and an appointment has been set up at [facility] according to the information given by the patient.  I told her to keep that appointment and that would be a useful and appropriate course to take care of her problem.  She is on amiodarone and she is fairly young and I am not sure whether this medicine is to be taken long-term circulation would be a good way of getting away from taking medications with toxic potential.  She understands and will do so.  Anticoagulation issues are also managed by her cardiologist and primary care. Essential hypertension: Blood pressure stable and diet was emphasized. Moderate aortic stenosis: Asymptomatic at this time.  Medical management. Morbid obesity: Weight reduction was stressed and diet was emphasized.  She promises to do better. Patient will be seen in follow-up appointment on a as needed basis.   Medication Adjustments/Labs and Tests Ordered: Current medicines are reviewed at length with the patient today.  Concerns regarding medicines are outlined above.  No orders of the defined types were placed in this encounter.  No orders of the defined types were placed in this encounter.    History of Present Illness:    Chelsea [name] is a 55  y.o. female who is being seen today for the evaluation of paroxysmal atrial flutter and fibrillation and moderate aortic stenosis at the request of Rudene Anda, MD. patient is a 55 year old morbidly obese female.  She has past medical history of mixed dyslipidemia, diabetes mellitus, paroxysmal atrial flutter and fibrillation and moderate aortic stenosis.  She has seen another cardiologist was recommended her for atrial flutter/fibrillation ablation and has made derangements for referral to [facility].  Patient is on significant doses of amiodarone therapy and also is taking Eliquis at this time.  She denies any chest pain orthopnea or PND.  She is here for second opinion.  She is brought in in a wheelchair.  She also uses oxygen.  Her significant other is with her also.  Past Medical History:  Diagnosis Date   Aortic stenosis    Echocardiogram 02/21/11:  Mean gradient 23 mm of mercury; peak gradient 36;; Turbulence noted in the area of the aortic isthmus with peak gradient 23 mmHg-consider MRI to assess for coarctation;    TEE: 04/02/11:  EF 55-60%, mod BAE, trileaflet AV with mild AS (pk and mean 25 and 16), mild AI, mild MR, mild TR, atrial septal aneurysm, no evidence of corarctation   Arthritis    "qwhere" (12/05/2017)   Asthma    Cervical cancer ([place]) 2006   CHF (congestive heart failure) (HCC)    Chronic lower back pain    Complication of anesthesia    "  I have a hard time waking up from under it" (12/05/2017)   COPD (chronic obstructive pulmonary disease) ([place])    "lung dr said I don't have this" (12/05/2017)   Coronary artery disease    Diastolic congestive heart failure ([place])    Echo 02/21/11: EF 55-60%, moderate LVH, grade 2 diastolic dysfunction, mild to moderate aortic stenosis with a mean gradient 23 mm of mercury   GERD (gastroesophageal reflux disease)    Heart murmur    Hepatitis C    History of gout X 1   Hyperlipidemia    Hypertension    Hypothyroid    IBS (irritable bowel  syndrome)    Migraine    "monthly" (12/05/2017)   Obesity hypoventilation syndrome ([place])    On home oxygen therapy    "2L all the time" (12/05/2017)   OSA treated with BiPAP    "have CPAP at home too; wearing BiPAP right now" (12/05/2017)   Pneumonia    "several times" (12/05/2017)   Type II diabetes mellitus ([place])     Past Surgical History:  Procedure Laterality Date   ABDOMINAL SURGERY  2006 X 2   "TAH incision was infected"   CHOLECYSTECTOMY OPEN     TONSILLECTOMY     TOTAL ABDOMINAL HYSTERECTOMY  2006    Current Medications: Current Meds  Medication Sig   acetaminophen (TYLENOL) 500 MG tablet Take 500 mg by mouth as needed for mild pain.   albuterol (PROVENTIL) (2.5 MG/3ML) 0.083% nebulizer solution Take 3 mLs (2.5 mg total) by nebulization every 6 (six) hours as needed for wheezing or shortness of breath (I50.32).   amiodarone (PACERONE) 200 MG tablet Take 200 mg by mouth daily.   amLODipine (NORVASC) 5 MG tablet Take 5 mg by mouth daily.   aspirin 81 MG tablet Take 81 mg by mouth daily.   atorvastatin (LIPITOR) 20 MG tablet Take 20 mg by mouth daily.   budesonide-formoterol (SYMBICORT) 160-4.5 MCG/ACT inhaler Inhale into the lungs.   cetirizine (ZYRTEC) 10 MG tablet Take 10 mg by mouth daily.   colesevelam (WELCHOL) 625 MG tablet Take 1,875 mg by mouth 2 (two) times daily.   ELIQUIS 5 MG TABS tablet Take 5 mg by mouth 2 (two) times daily.   famotidine (PEPCID) 20 MG tablet Take 20 mg by mouth daily.   fenofibrate (TRICOR) 145 MG tablet Take 145 mg by mouth at bedtime.   ferrous sulfate 325 (65 FE) MG tablet Take 325 mg by mouth daily with breakfast.   furosemide (LASIX) 80 MG tablet Take 80 mg by mouth daily.   gabapentin (NEURONTIN) 400 MG capsule TAKE 1 CAPSULE BY MOUTH THREE TIMES A DAY   glucosamine-chondroitin 500-400 MG tablet Take 1 tablet by mouth 2 (two) times daily.   levothyroxine (SYNTHROID, LEVOTHROID) 112 MCG tablet Take 112 mcg by mouth daily.   losartan  (COZAAR) 25 MG tablet Take 25 mg by mouth daily.    metFORMIN (GLUCOPHAGE-XR) 500 MG 24 hr tablet Take 1 tablet by mouth 2 (two) times daily.   metoprolol succinate (TOPROL-XL) 25 MG 24 hr tablet Take 25 mg by mouth daily.   Multiple Vitamin (MULTIVITAMIN) tablet Take 1 tablet by mouth daily.   NON FORMULARY Oxygen 2 liters continuous and cpap   potassium chloride SA (K-DUR,KLOR-CON) 20 MEQ tablet Take 20 mEq by mouth daily.   rOPINIRole (REQUIP) 2 MG tablet Take 2 mg by mouth at bedtime.   TRADJENTA 5 MG TABS tablet Take 5 mg by mouth daily.  traMADol (ULTRAM) 50 MG tablet Take 50 mg by mouth 3 (three) times daily as needed for moderate pain.   TRULICITY 1.5 RJ/1.8AC SOPN Inject 1.5 mg into the skin once a week.   WIXELA INHUB 250-50 MCG/ACT AEPB Inhale 1 puff into the lungs 2 (two) times daily.     Allergies:   Penicillins and Minoxidil   Social History   Socioeconomic History   Marital status: Divorced    Spouse name: Not on file   Number of children: Not on file   Years of education: Not on file   Highest education level: Not on file  Occupational History   Occupation: disabled  Tobacco Use   Smoking status: Never   Smokeless tobacco: Never  Vaping Use   Vaping Use: Never used  Substance and Sexual Activity   Alcohol use: Yes    Comment: 12/05/2017 "1-2 drinks/year"   Drug use: No   Sexual activity: Not Currently  Other Topics Concern   Not on file  Social History Narrative   Not on file   Social Determinants of Health   Financial Resource Strain: Not on file  Food Insecurity: Not on file  Transportation Needs: Not on file  Physical Activity: Not on file  Stress: Not on file  Social Connections: Not on file     Family History: The patient's family history includes Heart disease in her father.  ROS:   Please see the history of present illness.    All other systems reviewed and are negative.  EKGs/Labs/Other Studies Reviewed:    The following studies were  reviewed today:  EKG was sinus nonspecific ST-T changes. Study Conclusions   - Left ventricle: The cavity size was normal. Wall thickness was    increased in a pattern of moderate LVH. Systolic function was    vigorous. The estimated ejection fraction was in the range of 65%    to 70%. Incoordinate septal motion. Doppler parameters are    consistent with abnormal left ventricular relaxation (grade 1    diastolic dysfunction). The E/e&' ratio is between 8-15,    suggesting indeterminate LV filling pressure.  - Aortic valve: Calcified. Moderate stenosis. There was trivial    regurgitation. Mean gradient (S): 21 mm Hg. Peak gradient (S): 40    mm Hg. Valve area (VTI): 1.48 cm^2. Valve area (Vmax): 1.32 cm^2.    Valve area (Vmean): 1.3 cm^2.  - Mitral valve: Mildly thickened leaflets . There was trivial    regurgitation.  - Left atrium: The atrium was mildly dilated.  - Right atrium: The atrium was mildly dilated.  - Inferior vena cava: The vessel was dilated. The respirophasic    diameter changes were blunted (< 50%), consistent with elevated    central venous pressure.    Recent Labs: No results found for requested labs within last 8760 hours.  Recent Lipid Panel No results found for: CHOL, TRIG, HDL, CHOLHDL, VLDL, LDLCALC, LDLDIRECT  Physical Exam:    VS:  BP 93/60   Pulse 69   Ht 5\' 5"  (1.651 m)   Wt (!) 361 lb 12.8 oz (164.1 kg)   SpO2 93%   BMI 60.21 kg/m     Wt Readings from Last 3 Encounters:  04/20/21 (!) 361 lb 12.8 oz (164.1 kg)  03/02/21 (!) 339 lb 12.8 oz (154.1 kg)  06/02/20 (!) 332 lb (150.6 kg)     GEN: Patient is in no acute distress HEENT: Normal NECK: No JVD; No carotid bruits LYMPHATICS: No  lymphadenopathy CARDIAC: S1 S2 regular, 2/6 systolic murmur at the apex. RESPIRATORY:  Clear to auscultation without rales, wheezing or rhonchi  ABDOMEN: Soft, non-tender, non-distended MUSCULOSKELETAL:  No edema; No deformity  SKIN: Warm and dry NEUROLOGIC:   Alert and oriented x 3 PSYCHIATRIC:  Normal affect    Signed, Jenean Lindau, MD  04/20/2021 10:43 AM    [place]

## 2021-04-24 DIAGNOSIS — R5381 Other malaise: Secondary | ICD-10-CM

## 2021-04-24 HISTORY — DX: Other malaise: R53.81

## 2021-05-24 ENCOUNTER — Inpatient Hospital Stay: Payer: Medicare Other | Admitting: Pulmonary Disease

## 2021-07-02 DIAGNOSIS — I872 Venous insufficiency (chronic) (peripheral): Secondary | ICD-10-CM | POA: Diagnosis present

## 2021-07-02 HISTORY — DX: Venous insufficiency (chronic) (peripheral): I87.2

## 2021-07-03 DIAGNOSIS — L89610 Pressure ulcer of right heel, unstageable: Secondary | ICD-10-CM

## 2021-07-03 DIAGNOSIS — M1990 Unspecified osteoarthritis, unspecified site: Secondary | ICD-10-CM | POA: Diagnosis present

## 2021-07-03 HISTORY — DX: Unspecified osteoarthritis, unspecified site: M19.90

## 2021-07-03 HISTORY — DX: Pressure ulcer of right heel, unstageable: L89.610

## 2021-07-04 ENCOUNTER — Ambulatory Visit: Payer: Medicare Other | Admitting: Cardiology

## 2021-09-01 ENCOUNTER — Ambulatory Visit: Payer: Medicare Other | Admitting: Primary Care

## 2021-09-04 DIAGNOSIS — I480 Paroxysmal atrial fibrillation: Secondary | ICD-10-CM | POA: Insufficient documentation

## 2021-09-04 DIAGNOSIS — Z8679 Personal history of other diseases of the circulatory system: Secondary | ICD-10-CM | POA: Insufficient documentation

## 2021-09-04 HISTORY — DX: Personal history of other diseases of the circulatory system: Z86.79

## 2021-09-04 HISTORY — DX: Paroxysmal atrial fibrillation: I48.0

## 2021-09-18 DIAGNOSIS — Z736 Limitation of activities due to disability: Secondary | ICD-10-CM | POA: Insufficient documentation

## 2021-09-18 HISTORY — DX: Limitation of activities due to disability: Z73.6

## 2021-10-16 ENCOUNTER — Telehealth: Payer: Self-pay | Admitting: Pulmonary Disease

## 2021-10-16 NOTE — Telephone Encounter (Signed)
Dr. Elsworth Soho your first available is 11/13/21 is that ok?

## 2021-10-16 NOTE — Telephone Encounter (Signed)
I received a request for N[facility] from [facility] pt RRT after home visit Recent hosp in dec 2022 Please schedule oV with me to assess

## 2021-10-16 NOTE — Telephone Encounter (Signed)
Called and spoke with patient to get her scheduled for OV with Dr. Elsworth Soho. She states that she is currently in the hospital in [facility] Patient has been scheduled for OV. Nothing further needed at this time.   Next Appt With Pulmonology Davonna Belling V. Elsworth Soho, MD) 11/13/2021 at 3:30 PM

## 2021-11-13 ENCOUNTER — Ambulatory Visit: Payer: Medicare Other | Admitting: Pulmonary Disease

## 2022-01-04 DIAGNOSIS — D649 Anemia, unspecified: Secondary | ICD-10-CM | POA: Diagnosis present

## 2022-01-28 DIAGNOSIS — J984 Other disorders of lung: Secondary | ICD-10-CM

## 2022-01-28 DIAGNOSIS — E669 Obesity, unspecified: Secondary | ICD-10-CM

## 2022-01-28 HISTORY — DX: Other disorders of lung: J98.4

## 2022-01-28 HISTORY — DX: Obesity, unspecified: E66.9

## 2022-02-12 DIAGNOSIS — G629 Polyneuropathy, unspecified: Secondary | ICD-10-CM | POA: Insufficient documentation

## 2022-02-12 DIAGNOSIS — I4892 Unspecified atrial flutter: Secondary | ICD-10-CM

## 2022-02-12 DIAGNOSIS — J9612 Chronic respiratory failure with hypercapnia: Secondary | ICD-10-CM

## 2022-02-12 DIAGNOSIS — Z79899 Other long term (current) drug therapy: Secondary | ICD-10-CM

## 2022-02-12 HISTORY — DX: Other long term (current) drug therapy: Z79.899

## 2022-02-12 HISTORY — DX: Unspecified atrial flutter: I48.92

## 2022-02-21 DIAGNOSIS — J810 Acute pulmonary edema: Secondary | ICD-10-CM | POA: Insufficient documentation

## 2022-02-21 HISTORY — DX: Acute pulmonary edema: J81.0

## 2022-03-29 DIAGNOSIS — J9621 Acute and chronic respiratory failure with hypoxia: Secondary | ICD-10-CM

## 2022-03-29 DIAGNOSIS — J449 Chronic obstructive pulmonary disease, unspecified: Secondary | ICD-10-CM

## 2022-03-29 DIAGNOSIS — G4733 Obstructive sleep apnea (adult) (pediatric): Secondary | ICD-10-CM

## 2022-03-30 DIAGNOSIS — J9621 Acute and chronic respiratory failure with hypoxia: Secondary | ICD-10-CM

## 2022-03-30 DIAGNOSIS — J449 Chronic obstructive pulmonary disease, unspecified: Secondary | ICD-10-CM

## 2022-03-30 DIAGNOSIS — G4733 Obstructive sleep apnea (adult) (pediatric): Secondary | ICD-10-CM

## 2022-03-31 DIAGNOSIS — J449 Chronic obstructive pulmonary disease, unspecified: Secondary | ICD-10-CM

## 2022-03-31 DIAGNOSIS — G4733 Obstructive sleep apnea (adult) (pediatric): Secondary | ICD-10-CM

## 2022-03-31 DIAGNOSIS — J9621 Acute and chronic respiratory failure with hypoxia: Secondary | ICD-10-CM

## 2022-04-01 DIAGNOSIS — J9621 Acute and chronic respiratory failure with hypoxia: Secondary | ICD-10-CM

## 2022-04-01 DIAGNOSIS — G4733 Obstructive sleep apnea (adult) (pediatric): Secondary | ICD-10-CM

## 2022-04-01 DIAGNOSIS — J449 Chronic obstructive pulmonary disease, unspecified: Secondary | ICD-10-CM

## 2022-04-02 DIAGNOSIS — J9621 Acute and chronic respiratory failure with hypoxia: Secondary | ICD-10-CM

## 2022-04-02 DIAGNOSIS — G4733 Obstructive sleep apnea (adult) (pediatric): Secondary | ICD-10-CM

## 2022-04-02 DIAGNOSIS — J449 Chronic obstructive pulmonary disease, unspecified: Secondary | ICD-10-CM

## 2022-04-03 DIAGNOSIS — G4733 Obstructive sleep apnea (adult) (pediatric): Secondary | ICD-10-CM

## 2022-04-03 DIAGNOSIS — J9621 Acute and chronic respiratory failure with hypoxia: Secondary | ICD-10-CM

## 2022-04-03 DIAGNOSIS — J449 Chronic obstructive pulmonary disease, unspecified: Secondary | ICD-10-CM

## 2022-04-04 DIAGNOSIS — J9621 Acute and chronic respiratory failure with hypoxia: Secondary | ICD-10-CM

## 2022-04-04 DIAGNOSIS — G4733 Obstructive sleep apnea (adult) (pediatric): Secondary | ICD-10-CM

## 2022-04-04 DIAGNOSIS — J449 Chronic obstructive pulmonary disease, unspecified: Secondary | ICD-10-CM

## 2022-04-07 ENCOUNTER — Inpatient Hospital Stay (HOSPITAL_COMMUNITY): Payer: Medicare Other

## 2022-04-07 ENCOUNTER — Encounter (HOSPITAL_COMMUNITY): Payer: Self-pay

## 2022-04-07 ENCOUNTER — Emergency Department (HOSPITAL_COMMUNITY): Payer: Medicare Other

## 2022-04-07 ENCOUNTER — Inpatient Hospital Stay (HOSPITAL_COMMUNITY)
Admission: EM | Admit: 2022-04-07 | Discharge: 2022-04-22 | DRG: 163 | Disposition: A | Discharge status: Other Acute Inpatient Hospital | Payer: Medicare Other | Source: Skilled Nursing Facility | Attending: Pulmonary Disease | Admitting: Pulmonary Disease

## 2022-04-07 DIAGNOSIS — J189 Pneumonia, unspecified organism: Secondary | ICD-10-CM | POA: Diagnosis not present

## 2022-04-07 DIAGNOSIS — E662 Morbid (severe) obesity with alveolar hypoventilation: Secondary | ICD-10-CM | POA: Diagnosis present

## 2022-04-07 DIAGNOSIS — I9589 Other hypotension: Secondary | ICD-10-CM | POA: Diagnosis present

## 2022-04-07 DIAGNOSIS — B952 Enterococcus as the cause of diseases classified elsewhere: Secondary | ICD-10-CM | POA: Diagnosis present

## 2022-04-07 DIAGNOSIS — D62 Acute posthemorrhagic anemia: Secondary | ICD-10-CM | POA: Diagnosis not present

## 2022-04-07 DIAGNOSIS — E039 Hypothyroidism, unspecified: Secondary | ICD-10-CM | POA: Diagnosis present

## 2022-04-07 DIAGNOSIS — E876 Hypokalemia: Secondary | ICD-10-CM | POA: Diagnosis present

## 2022-04-07 DIAGNOSIS — D638 Anemia in other chronic diseases classified elsewhere: Secondary | ICD-10-CM | POA: Diagnosis present

## 2022-04-07 DIAGNOSIS — Z6841 Body Mass Index (BMI) 40.0 and over, adult: Secondary | ICD-10-CM

## 2022-04-07 DIAGNOSIS — Y848 Other medical procedures as the cause of abnormal reaction of the patient, or of later complication, without mention of misadventure at the time of the procedure: Secondary | ICD-10-CM | POA: Diagnosis present

## 2022-04-07 DIAGNOSIS — Z880 Allergy status to penicillin: Secondary | ICD-10-CM

## 2022-04-07 DIAGNOSIS — I5032 Chronic diastolic (congestive) heart failure: Secondary | ICD-10-CM | POA: Diagnosis present

## 2022-04-07 DIAGNOSIS — R6521 Severe sepsis with septic shock: Secondary | ICD-10-CM | POA: Diagnosis not present

## 2022-04-07 DIAGNOSIS — Z91041 Radiographic dye allergy status: Secondary | ICD-10-CM

## 2022-04-07 DIAGNOSIS — I2510 Atherosclerotic heart disease of native coronary artery without angina pectoris: Secondary | ICD-10-CM | POA: Diagnosis not present

## 2022-04-07 DIAGNOSIS — R58 Hemorrhage, not elsewhere classified: Secondary | ICD-10-CM | POA: Diagnosis present

## 2022-04-07 DIAGNOSIS — Z7901 Long term (current) use of anticoagulants: Secondary | ICD-10-CM

## 2022-04-07 DIAGNOSIS — I482 Chronic atrial fibrillation, unspecified: Secondary | ICD-10-CM | POA: Diagnosis present

## 2022-04-07 DIAGNOSIS — E66813 Obesity, class 3: Secondary | ICD-10-CM

## 2022-04-07 DIAGNOSIS — Z20822 Contact with and (suspected) exposure to covid-19: Secondary | ICD-10-CM | POA: Diagnosis present

## 2022-04-07 DIAGNOSIS — I872 Venous insufficiency (chronic) (peripheral): Secondary | ICD-10-CM | POA: Diagnosis present

## 2022-04-07 DIAGNOSIS — K219 Gastro-esophageal reflux disease without esophagitis: Secondary | ICD-10-CM | POA: Diagnosis present

## 2022-04-07 DIAGNOSIS — Z7982 Long term (current) use of aspirin: Secondary | ICD-10-CM

## 2022-04-07 DIAGNOSIS — Z79899 Other long term (current) drug therapy: Secondary | ICD-10-CM

## 2022-04-07 DIAGNOSIS — I350 Nonrheumatic aortic (valve) stenosis: Secondary | ICD-10-CM | POA: Diagnosis present

## 2022-04-07 DIAGNOSIS — E782 Mixed hyperlipidemia: Secondary | ICD-10-CM | POA: Diagnosis present

## 2022-04-07 DIAGNOSIS — R042 Hemoptysis: Secondary | ICD-10-CM | POA: Diagnosis present

## 2022-04-07 DIAGNOSIS — Z90710 Acquired absence of both cervix and uterus: Secondary | ICD-10-CM

## 2022-04-07 DIAGNOSIS — E1165 Type 2 diabetes mellitus with hyperglycemia: Secondary | ICD-10-CM | POA: Diagnosis not present

## 2022-04-07 DIAGNOSIS — E11649 Type 2 diabetes mellitus with hypoglycemia without coma: Secondary | ICD-10-CM | POA: Diagnosis not present

## 2022-04-07 DIAGNOSIS — G4733 Obstructive sleep apnea (adult) (pediatric): Secondary | ICD-10-CM | POA: Diagnosis present

## 2022-04-07 DIAGNOSIS — J9612 Chronic respiratory failure with hypercapnia: Secondary | ICD-10-CM

## 2022-04-07 DIAGNOSIS — A419 Sepsis, unspecified organism: Secondary | ICD-10-CM | POA: Diagnosis not present

## 2022-04-07 DIAGNOSIS — I959 Hypotension, unspecified: Secondary | ICD-10-CM

## 2022-04-07 DIAGNOSIS — Z9911 Dependence on respirator [ventilator] status: Secondary | ICD-10-CM

## 2022-04-07 DIAGNOSIS — L89152 Pressure ulcer of sacral region, stage 2: Secondary | ICD-10-CM | POA: Diagnosis present

## 2022-04-07 DIAGNOSIS — M792 Neuralgia and neuritis, unspecified: Secondary | ICD-10-CM | POA: Diagnosis present

## 2022-04-07 DIAGNOSIS — J449 Chronic obstructive pulmonary disease, unspecified: Secondary | ICD-10-CM | POA: Diagnosis present

## 2022-04-07 DIAGNOSIS — B1920 Unspecified viral hepatitis C without hepatic coma: Secondary | ICD-10-CM | POA: Diagnosis present

## 2022-04-07 DIAGNOSIS — Z8249 Family history of ischemic heart disease and other diseases of the circulatory system: Secondary | ICD-10-CM

## 2022-04-07 DIAGNOSIS — R5381 Other malaise: Secondary | ICD-10-CM | POA: Diagnosis present

## 2022-04-07 DIAGNOSIS — Z7984 Long term (current) use of oral hypoglycemic drugs: Secondary | ICD-10-CM

## 2022-04-07 DIAGNOSIS — J9622 Acute and chronic respiratory failure with hypercapnia: Secondary | ICD-10-CM | POA: Diagnosis present

## 2022-04-07 DIAGNOSIS — I110 Hypertensive heart disease with heart failure: Secondary | ICD-10-CM | POA: Diagnosis present

## 2022-04-07 DIAGNOSIS — F32A Depression, unspecified: Secondary | ICD-10-CM | POA: Diagnosis present

## 2022-04-07 DIAGNOSIS — E1140 Type 2 diabetes mellitus with diabetic neuropathy, unspecified: Secondary | ICD-10-CM | POA: Diagnosis present

## 2022-04-07 DIAGNOSIS — R049 Hemorrhage from respiratory passages, unspecified: Secondary | ICD-10-CM

## 2022-04-07 DIAGNOSIS — D649 Anemia, unspecified: Secondary | ICD-10-CM | POA: Diagnosis present

## 2022-04-07 DIAGNOSIS — I509 Heart failure, unspecified: Secondary | ICD-10-CM | POA: Diagnosis not present

## 2022-04-07 DIAGNOSIS — R0609 Other forms of dyspnea: Secondary | ICD-10-CM | POA: Diagnosis not present

## 2022-04-07 DIAGNOSIS — J984 Other disorders of lung: Secondary | ICD-10-CM | POA: Diagnosis present

## 2022-04-07 DIAGNOSIS — J9501 Hemorrhage from tracheostomy stoma: Secondary | ICD-10-CM | POA: Diagnosis present

## 2022-04-07 DIAGNOSIS — Z79890 Hormone replacement therapy (postmenopausal): Secondary | ICD-10-CM

## 2022-04-07 DIAGNOSIS — G8929 Other chronic pain: Secondary | ICD-10-CM | POA: Diagnosis present

## 2022-04-07 DIAGNOSIS — Z9981 Dependence on supplemental oxygen: Secondary | ICD-10-CM

## 2022-04-07 DIAGNOSIS — R339 Retention of urine, unspecified: Secondary | ICD-10-CM | POA: Diagnosis present

## 2022-04-07 DIAGNOSIS — J9621 Acute and chronic respiratory failure with hypoxia: Secondary | ICD-10-CM

## 2022-04-07 DIAGNOSIS — E669 Obesity, unspecified: Secondary | ICD-10-CM

## 2022-04-07 DIAGNOSIS — Z8541 Personal history of malignant neoplasm of cervix uteri: Secondary | ICD-10-CM

## 2022-04-07 DIAGNOSIS — I480 Paroxysmal atrial fibrillation: Secondary | ICD-10-CM | POA: Diagnosis present

## 2022-04-07 DIAGNOSIS — F419 Anxiety disorder, unspecified: Secondary | ICD-10-CM | POA: Diagnosis present

## 2022-04-07 DIAGNOSIS — M1990 Unspecified osteoarthritis, unspecified site: Secondary | ICD-10-CM | POA: Diagnosis present

## 2022-04-07 DIAGNOSIS — J9611 Chronic respiratory failure with hypoxia: Secondary | ICD-10-CM | POA: Diagnosis present

## 2022-04-07 HISTORY — DX: Acute and chronic respiratory failure with hypoxia: J96.21

## 2022-04-07 HISTORY — DX: Hemorrhage from respiratory passages, unspecified: R04.9

## 2022-04-07 LAB — CBC WITH DIFFERENTIAL/PLATELET
Abs Immature Granulocytes: 0.03 10*3/uL (ref 0.00–0.07)
Basophils Absolute: 0 10*3/uL (ref 0.0–0.1)
Basophils Relative: 0 %
Eosinophils Absolute: 0 10*3/uL (ref 0.0–0.5)
Eosinophils Relative: 0 %
HCT: 28.3 % — ABNORMAL LOW (ref 36.0–46.0)
Hemoglobin: 8 g/dL — ABNORMAL LOW (ref 12.0–15.0)
Immature Granulocytes: 0 %
Lymphocytes Relative: 2 %
Lymphs Abs: 0.3 10*3/uL — ABNORMAL LOW (ref 0.7–4.0)
MCH: 25.8 pg — ABNORMAL LOW (ref 26.0–34.0)
MCHC: 28.3 g/dL — ABNORMAL LOW (ref 30.0–36.0)
MCV: 91.3 fL (ref 80.0–100.0)
Monocytes Absolute: 0.6 10*3/uL (ref 0.1–1.0)
Monocytes Relative: 4 %
Neutro Abs: 13.1 10*3/uL — ABNORMAL HIGH (ref 1.7–7.7)
Neutrophils Relative %: 94 %
Platelets: 340 10*3/uL (ref 150–400)
RBC: 3.1 MIL/uL — ABNORMAL LOW (ref 3.87–5.11)
RDW: 15.1 % (ref 11.5–15.5)
WBC: 14 10*3/uL — ABNORMAL HIGH (ref 4.0–10.5)
nRBC: 0 % (ref 0.0–0.2)

## 2022-04-07 LAB — COMPREHENSIVE METABOLIC PANEL
ALT: 24 U/L (ref 0–44)
AST: 10 U/L — ABNORMAL LOW (ref 15–41)
Albumin: 2.8 g/dL — ABNORMAL LOW (ref 3.5–5.0)
Alkaline Phosphatase: 62 U/L (ref 38–126)
Anion gap: 11 (ref 5–15)
BUN: 29 mg/dL — ABNORMAL HIGH (ref 6–20)
CO2: 42 mmol/L — ABNORMAL HIGH (ref 22–32)
Calcium: 8.5 mg/dL — ABNORMAL LOW (ref 8.9–10.3)
Chloride: 88 mmol/L — ABNORMAL LOW (ref 98–111)
Creatinine, Ser: 0.57 mg/dL (ref 0.44–1.00)
GFR, Estimated: >60 mL/min (ref >60)
Glucose, Bld: 384 mg/dL — ABNORMAL HIGH (ref 70–99)
Potassium: 4.4 mmol/L (ref 3.5–5.1)
Sodium: 141 mmol/L (ref 135–145)
Total Bilirubin: 0.3 mg/dL (ref 0.3–1.2)
Total Protein: 6.2 g/dL — ABNORMAL LOW (ref 6.5–8.1)

## 2022-04-07 LAB — I-STAT CHEM 8, ED
BUN: 31 mg/dL — ABNORMAL HIGH (ref 6–20)
Calcium, Ion: 1.11 mmol/L — ABNORMAL LOW (ref 1.15–1.40)
Chloride: 87 mmol/L — ABNORMAL LOW (ref 98–111)
Creatinine, Ser: 0.6 mg/dL (ref 0.44–1.00)
Glucose, Bld: 388 mg/dL — ABNORMAL HIGH (ref 70–99)
HCT: 29 % — ABNORMAL LOW (ref 36.0–46.0)
Hemoglobin: 9.9 g/dL — ABNORMAL LOW (ref 12.0–15.0)
Potassium: 4.3 mmol/L (ref 3.5–5.1)
Sodium: 138 mmol/L (ref 135–145)
TCO2: 45 mmol/L — ABNORMAL HIGH (ref 22–32)

## 2022-04-07 LAB — HEMOGLOBIN A1C
Hgb A1c MFr Bld: 5.9 % — ABNORMAL HIGH (ref 4.8–5.6)
Mean Plasma Glucose: 122.63 mg/dL

## 2022-04-07 LAB — LACTIC ACID, PLASMA
Lactic Acid, Venous: 0.9 mmol/L (ref 0.5–1.9)
Lactic Acid, Venous: 1.6 mmol/L (ref 0.5–1.9)
Lactic Acid, Venous: 1.8 mmol/L (ref 0.5–1.9)

## 2022-04-07 LAB — CBC
HCT: 24.2 % — ABNORMAL LOW (ref 36.0–46.0)
HCT: 29.1 % — ABNORMAL LOW (ref 36.0–46.0)
Hemoglobin: 7 g/dL — ABNORMAL LOW (ref 12.0–15.0)
Hemoglobin: 9 g/dL — ABNORMAL LOW (ref 12.0–15.0)
MCH: 25.9 pg — ABNORMAL LOW (ref 26.0–34.0)
MCH: 26.8 pg (ref 26.0–34.0)
MCHC: 28.9 g/dL — ABNORMAL LOW (ref 30.0–36.0)
MCHC: 30.9 g/dL (ref 30.0–36.0)
MCV: 89.6 fL (ref 80.0–100.0)
MCV: 86.6 fL (ref 80.0–100.0)
Platelets: 474 10*3/uL — ABNORMAL HIGH (ref 150–400)
Platelets: 514 10*3/uL — ABNORMAL HIGH (ref 150–400)
RBC: 2.7 MIL/uL — ABNORMAL LOW (ref 3.87–5.11)
RBC: 3.36 MIL/uL — ABNORMAL LOW (ref 3.87–5.11)
RDW: 15.3 % (ref 11.5–15.5)
RDW: 15.2 % (ref 11.5–15.5)
WBC: 36.4 10*3/uL — ABNORMAL HIGH (ref 4.0–10.5)
WBC: 41.1 10*3/uL — ABNORMAL HIGH (ref 4.0–10.5)
nRBC: 0 % (ref 0.0–0.2)
nRBC: 0 % (ref 0.0–0.2)

## 2022-04-07 LAB — POCT I-STAT 7, (LYTES, BLD GAS, ICA,H+H)
Acid-Base Excess: 19 mmol/L — ABNORMAL HIGH (ref 0.0–2.0)
Bicarbonate: 45.1 mmol/L — ABNORMAL HIGH (ref 20.0–28.0)
Calcium, Ion: 1.14 mmol/L — ABNORMAL LOW (ref 1.15–1.40)
HCT: 27 % — ABNORMAL LOW (ref 36.0–46.0)
Hemoglobin: 9.2 g/dL — ABNORMAL LOW (ref 12.0–15.0)
O2 Saturation: 54 %
Patient temperature: 100.5
Potassium: 3.9 mmol/L (ref 3.5–5.1)
Sodium: 137 mmol/L (ref 135–145)
TCO2: 47 mmol/L — ABNORMAL HIGH (ref 22–32)
pCO2 arterial: 67.9 mmHg (ref 32–48)
pH, Arterial: 7.435 (ref 7.35–7.45)
pO2, Arterial: 31 mmHg — CL (ref 83–108)

## 2022-04-07 LAB — I-STAT VENOUS BLOOD GAS, ED
Acid-Base Excess: 20 mmol/L — ABNORMAL HIGH (ref 0.0–2.0)
Bicarbonate: 48.1 mmol/L — ABNORMAL HIGH (ref 20.0–28.0)
Calcium, Ion: 1.12 mmol/L — ABNORMAL LOW (ref 1.15–1.40)
HCT: 29 % — ABNORMAL LOW (ref 36.0–46.0)
Hemoglobin: 9.9 g/dL — ABNORMAL LOW (ref 12.0–15.0)
O2 Saturation: 80 %
Potassium: 4.3 mmol/L (ref 3.5–5.1)
Sodium: 138 mmol/L (ref 135–145)
TCO2: >50 mmol/L — ABNORMAL HIGH (ref 22–32)
pCO2, Ven: 75 mmHg (ref 44–60)
pH, Ven: 7.415 (ref 7.25–7.43)
pO2, Ven: 47 mmHg — ABNORMAL HIGH (ref 32–45)

## 2022-04-07 LAB — CBG MONITORING, ED: Glucose-Capillary: 408 mg/dL — ABNORMAL HIGH (ref 70–99)

## 2022-04-07 LAB — BRAIN NATRIURETIC PEPTIDE: B Natriuretic Peptide: 235.6 pg/mL — ABNORMAL HIGH (ref 0.0–100.0)

## 2022-04-07 LAB — GLUCOSE, CAPILLARY: Glucose-Capillary: 340 mg/dL — ABNORMAL HIGH (ref 70–99)

## 2022-04-07 LAB — TROPONIN I (HIGH SENSITIVITY)
Troponin I (High Sensitivity): 10 ng/L (ref <18)
Troponin I (High Sensitivity): 17 ng/L (ref <18)

## 2022-04-07 LAB — PREPARE RBC (CROSSMATCH)

## 2022-04-07 LAB — PROTIME-INR
INR: 1.4 — ABNORMAL HIGH (ref 0.8–1.2)
INR: 1.4 — ABNORMAL HIGH (ref 0.8–1.2)
Prothrombin Time: 17.2 seconds — ABNORMAL HIGH (ref 11.4–15.2)
Prothrombin Time: 17 seconds — ABNORMAL HIGH (ref 11.4–15.2)

## 2022-04-07 LAB — HIV ANTIBODY (ROUTINE TESTING W REFLEX): HIV Screen 4th Generation wRfx: NONREACTIVE

## 2022-04-07 LAB — TSH: TSH: 0.078 u[IU]/mL — ABNORMAL LOW (ref 0.350–4.500)

## 2022-04-07 LAB — ABO/RH: ABO/RH(D): A POS

## 2022-04-07 MED ORDER — VASOPRESSIN 20 UNITS/100 ML INFUSION FOR SHOCK
0.0000 [IU]/min | INTRAVENOUS | Status: DC
  Administered 2022-04-07 – 2022-04-10 (×7): 0.03 [IU]/min via INTRAVENOUS
  Filled 2022-04-07 (×7): qty 100

## 2022-04-07 MED ORDER — DOCUSATE SODIUM 100 MG PO CAPS: 100.0000 mg | ORAL_CAPSULE | Freq: Two times a day (BID) | ORAL | Status: DC | PRN

## 2022-04-07 MED ORDER — HYDROMORPHONE HCL 1 MG/ML IJ SOLN
1.0000 mg | INTRAMUSCULAR | Status: DC | PRN
  Administered 2022-04-07 – 2022-04-22 (×69): 1 mg via INTRAVENOUS
  Filled 2022-04-07 (×73): qty 1

## 2022-04-07 MED ORDER — INSULIN GLARGINE-YFGN 100 UNIT/ML ~~LOC~~ SOLN
10.0000 [IU] | Freq: Every day | SUBCUTANEOUS | Status: DC
  Administered 2022-04-08 (×2): 10 [IU] via SUBCUTANEOUS
  Filled 2022-04-07 (×3): qty 0.1

## 2022-04-07 MED ORDER — DOPAMINE-DEXTROSE 3.2-5 MG/ML-% IV SOLN
0.0000 ug/kg/min | INTRAVENOUS | Status: DC
  Administered 2022-04-07: 5 ug/kg/min via INTRAVENOUS
  Filled 2022-04-07: qty 250

## 2022-04-07 MED ORDER — NOREPINEPHRINE 4 MG/250ML-% IV SOLN
0.0000 ug/min | INTRAVENOUS | Status: DC
  Administered 2022-04-07: 4 ug/min via INTRAVENOUS
  Administered 2022-04-08: 18 ug/min via INTRAVENOUS
  Filled 2022-04-07 (×3): qty 250
  Filled 2022-04-07: qty 500

## 2022-04-07 MED ORDER — VASOPRESSIN 20 UNITS/100 ML INFUSION FOR SHOCK: 0.0000 [IU]/min | INTRAVENOUS | Status: DC

## 2022-04-07 MED ORDER — PANTOPRAZOLE 2 MG/ML SUSPENSION: 40.0000 mg | Freq: Every day | ORAL | Status: DC

## 2022-04-07 MED ORDER — PROTHROMBIN COMPLEX CONC HUMAN 500 UNITS IV KIT
4296.0000 [IU] | PACK | Status: AC
  Administered 2022-04-07: 4296 [IU] via INTRAVENOUS
  Filled 2022-04-07: qty 4296

## 2022-04-07 MED ORDER — POLYETHYLENE GLYCOL 3350 17 G PO PACK: 17.0000 g | PACK | Freq: Every day | ORAL | Status: DC | PRN

## 2022-04-07 MED ORDER — SODIUM CHLORIDE 0.9% IV SOLUTION: Freq: Once | INTRAVENOUS | Status: AC

## 2022-04-07 MED ORDER — INSULIN ASPART 100 UNIT/ML IJ SOLN
0.0000 [IU] | INTRAMUSCULAR | Status: DC
  Administered 2022-04-07: 15 [IU] via SUBCUTANEOUS
  Administered 2022-04-08 (×2): 3 [IU] via SUBCUTANEOUS
  Administered 2022-04-08: 11 [IU] via SUBCUTANEOUS
  Administered 2022-04-08: 8 [IU] via SUBCUTANEOUS
  Administered 2022-04-08: 3 [IU] via SUBCUTANEOUS
  Administered 2022-04-08: 8 [IU] via SUBCUTANEOUS
  Administered 2022-04-09: 15 [IU] via SUBCUTANEOUS
  Administered 2022-04-09 (×2): 8 [IU] via SUBCUTANEOUS

## 2022-04-07 MED ORDER — LACTATED RINGERS IV BOLUS
500.0000 mL | Freq: Once | INTRAVENOUS | Status: AC
  Administered 2022-04-07: 500 mL via INTRAVENOUS

## 2022-04-07 MED ORDER — SODIUM CHLORIDE 0.9 % IV SOLN
2.0000 g | Freq: Three times a day (TID) | INTRAVENOUS | Status: DC
  Administered 2022-04-07 – 2022-04-11 (×11): 2 g via INTRAVENOUS
  Filled 2022-04-07 (×11): qty 12.5

## 2022-04-07 MED ORDER — PANTOPRAZOLE 2 MG/ML SUSPENSION
40.0000 mg | Freq: Every day | ORAL | Status: DC
Start: 2022-04-07 — End: 2022-04-12
  Administered 2022-04-08 – 2022-04-11 (×5): 40 mg
  Filled 2022-04-07 (×5): qty 20

## 2022-04-07 NOTE — Progress Notes (Signed)
Patient ID: Chelsea [name], female   DOB: 04-17-1966, 56 y.o.   MRN: [id number]  Called to bedside due to continued bleeding, some from trach site but more that she is spitting out.  Bleeding source likely remains the stoma with bleeding coming into the airway due to the packing.  Applied additional packing to try to better control bleeding and this seems to have stabilized the situation.  Best approach remains packing.  This should allow time for anti-coagulation to reverse and bleeding source to clot.  If this is not effective ultimately, operative management may be necessary.  The only way to effectively evaluate the stoma for control of bleeding would require removal of the trach tube which introduces additional risk.

## 2022-04-07 NOTE — Progress Notes (Signed)
Pt being followed by [facility] for Sepsis protocol. 

## 2022-04-07 NOTE — ED Notes (Signed)
Questions about the ng tube was told by critical care pa whitney  that not to use will deal with that as soon as she gets a bed.  Her  bleeding around her trach is still  oozing and she continues to suction blood from her  mouth

## 2022-04-07 NOTE — Progress Notes (Signed)
[facility] Progress Note Patient Name: MARKEESHA CHAR DOB: 12/19/1965 MRN: [id number]   Date of Service  04/07/2022  HPI/Events of Note  Notified of tachycardia 130s. Sinus on EKG Increasing pressor requirement Temp 100.2 WBC 36.4, H/H 7/24.2, platelets 474 Still with bloody trach output  eICU Interventions  500 cc LR bolus Transfuse 1 unit PRBC Panculture, family reports recent treatment for UTI with chronic indwelling foley. Change foley Start piperacillin-tazobactam Get CXR to confirm feeding tube placement     Intervention Category Major Interventions: Hypotension - evaluation and management  Judd Lien 04/07/2022, 9:12 PM

## 2022-04-07 NOTE — ED Notes (Signed)
The ent doctor just left   and critical care  has been at  the bedside

## 2022-04-07 NOTE — Consult Note (Signed)
Reason for Consult: Trach bleeding Referring Physician: ER  Chelsea [name] is an 56 y.o. female.  HPI: 56 year old female underwent tracheostomy at [facility] over a week ago by Dr. Amalia Hailey.  She had been doing well still on the ventilator until 1230 today when she had onset of active bleeding from her trach site.  It is not clear if Dr. Amalia Hailey was informed.  Evidently, there was effort to control bleeding by injecting epinephrine around the stoma.  She was transported to [facility] hospital ER.  She has been coughing a lot over the past day or two as well.  She is treated with Eliquis due to atrial fibrillation.  Past Medical History:  Diagnosis Date   Aortic stenosis    Echocardiogram 02/21/11:  Mean gradient 23 mm of mercury; peak gradient 36;; Turbulence noted in the area of the aortic isthmus with peak gradient 23 mmHg-consider MRI to assess for coarctation;    TEE: 04/02/11:  EF 55-60%, mod BAE, trileaflet AV with mild AS (pk and mean 25 and 16), mild AI, mild MR, mild TR, atrial septal aneurysm, no evidence of corarctation   Arthritis    "qwhere" (12/05/2017)   Asthma    Cervical cancer ([place]) 2006   CHF (congestive heart failure) (HCC)    Chronic lower back pain    Complication of anesthesia    "I have a hard time waking up from under it" (12/05/2017)   COPD (chronic obstructive pulmonary disease) ([place])    "lung dr said I don't have this" (12/05/2017)   Coronary artery disease    Diastolic congestive heart failure ([place])    Echo 02/21/11: EF 55-60%, moderate LVH, grade 2 diastolic dysfunction, mild to moderate aortic stenosis with a mean gradient 23 mm of mercury   GERD (gastroesophageal reflux disease)    Heart murmur    Hepatitis C    History of gout X 1   Hyperlipidemia    Hypertension    Hypothyroid    IBS (irritable bowel syndrome)    Migraine    "monthly" (12/05/2017)   Obesity hypoventilation syndrome ([place])    On home oxygen therapy    "2L all the time" (12/05/2017)   OSA treated with  BiPAP    "have CPAP at home too; wearing BiPAP right now" (12/05/2017)   Pneumonia    "several times" (12/05/2017)   Type II diabetes mellitus ([place])     Past Surgical History:  Procedure Laterality Date   ABDOMINAL SURGERY  2006 X 2   "TAH incision was infected"   CHOLECYSTECTOMY OPEN     TONSILLECTOMY     TOTAL ABDOMINAL HYSTERECTOMY  2006    Family History  Problem Relation Age of Onset   Heart disease Father     Social History:  reports that she has never smoked. She has never used smokeless tobacco. She reports current alcohol use. She reports that she does not use drugs.  Allergies:  Allergies  Allergen Reactions   Penicillins Anaphylaxis    Patient tolerates cefepime (08/2017)   Minoxidil     Other reaction(s): hives    Medications: I have reviewed the patient's current medications.  Results for orders placed or performed during the hospital encounter of 04/07/22 (from the past 48 hour(s))  Type and screen [place]     Status: None   Collection Time: 04/07/22  2:15 PM  Result Value Ref Range   ABO/RH(D) A POS    Antibody Screen NEG  Sample Expiration      04/10/2022,2359 Performed at [facility], [place] [address] [place], [place] [address]   Protime-INR     Status: Abnormal   Collection Time: 04/07/22  2:15 PM  Result Value Ref Range   Prothrombin Time 17.2 (H) 11.4 - 15.2 seconds   INR 1.4 (H) 0.8 - 1.2    Comment: (NOTE) INR goal varies based on device and disease states. Performed at [facility], [place] [address], [place], [place] [address]   CBC with Differential     Status: Abnormal   Collection Time: 04/07/22  2:15 PM  Result Value Ref Range   WBC 14.0 (H) 4.0 - 10.5 K/uL   RBC 3.10 (L) 3.87 - 5.11 MIL/uL   Hemoglobin 8.0 (L) 12.0 - 15.0 g/dL   HCT 28.3 (L) 36.0 - 46.0 %   MCV 91.3 80.0 - 100.0 fL   MCH 25.8 (L) 26.0 - 34.0 pg   MCHC 28.3 (L) 30.0 - 36.0 g/dL   RDW 15.1 11.5 - 15.5 %   Platelets 340 150 - 400  K/uL   nRBC 0.0 0.0 - 0.2 %   Neutrophils Relative % 94 %   Neutro Abs 13.1 (H) 1.7 - 7.7 K/uL   Lymphocytes Relative 2 %   Lymphs Abs 0.3 (L) 0.7 - 4.0 K/uL   Monocytes Relative 4 %   Monocytes Absolute 0.6 0.1 - 1.0 K/uL   Eosinophils Relative 0 %   Eosinophils Absolute 0.0 0.0 - 0.5 K/uL   Basophils Relative 0 %   Basophils Absolute 0.0 0.0 - 0.1 K/uL   Immature Granulocytes 0 %   Abs Immature Granulocytes 0.03 0.00 - 0.07 K/uL    Comment: Performed at [facility], [place] [address], [place], [place] [address]  Comprehensive metabolic panel     Status: Abnormal   Collection Time: 04/07/22  2:15 PM  Result Value Ref Range   Sodium 141 135 - 145 mmol/L   Potassium 4.4 3.5 - 5.1 mmol/L   Chloride 88 (L) 98 - 111 mmol/L   CO2 42 (H) 22 - 32 mmol/L   Glucose, Bld 384 (H) 70 - 99 mg/dL    Comment: Glucose reference range applies only to samples taken after fasting for at least 8 hours.   BUN 29 (H) 6 - 20 mg/dL   Creatinine, Ser 0.57 0.44 - 1.00 mg/dL   Calcium 8.5 (L) 8.9 - 10.3 mg/dL   Total Protein 6.2 (L) 6.5 - 8.1 g/dL   Albumin 2.8 (L) 3.5 - 5.0 g/dL   AST 10 (L) 15 - 41 U/L   ALT 24 0 - 44 U/L   Alkaline Phosphatase 62 38 - 126 U/L   Total Bilirubin 0.3 0.3 - 1.2 mg/dL   GFR, Estimated >60 >60 mL/min    Comment: (NOTE) Calculated using the CKD-EPI Creatinine Equation (2021)    Anion gap 11 5 - 15    Comment: Performed at [facility], [place] [address] [place], [place] [address]  Troponin I (High Sensitivity)     Status: None   Collection Time: 04/07/22  2:15 PM  Result Value Ref Range   Troponin I (High Sensitivity) 10 <18 ng/L    Comment: (NOTE) Elevated high sensitivity troponin I (hsTnI) values and significant  changes across serial measurements may suggest ACS but many other  chronic and acute conditions are known to elevate hsTnI results.  Refer to the "Links" section for chest pain  algorithms and additional  guidance. Performed at [facility], [place] [address] [place], [place] [address]   Lactic acid, plasma     Status: None   Collection Time: 04/07/22  2:15 PM  Result Value Ref Range   Lactic Acid, Venous 0.9 0.5 - 1.9 mmol/L    Comment: Performed at [place] [address], [place], [place] [address]  I-stat chem 8, ED     Status: Abnormal   Collection Time: 04/07/22  2:36 PM  Result Value Ref Range   Sodium 138 135 - 145 mmol/L   Potassium 4.3 3.5 - 5.1 mmol/L   Chloride 87 (L) 98 - 111 mmol/L   BUN 31 (H) 6 - 20 mg/dL   Creatinine, Ser 0.60 0.44 - 1.00 mg/dL   Glucose, Bld 388 (H) 70 - 99 mg/dL    Comment: Glucose reference range applies only to samples taken after fasting for at least 8 hours.   Calcium, Ion 1.11 (L) 1.15 - 1.40 mmol/L   TCO2 45 (H) 22 - 32 mmol/L   Hemoglobin 9.9 (L) 12.0 - 15.0 g/dL   HCT 29.0 (L) 36.0 - 46.0 %  I-Stat venous blood gas, ED     Status: Abnormal   Collection Time: 04/07/22  2:36 PM  Result Value Ref Range   pH, Ven 7.415 7.25 - 7.43   pCO2, Ven 75.0 (HH) 44 - 60 mmHg   pO2, Ven 47 (H) 32 - 45 mmHg   Bicarbonate 48.1 (H) 20.0 - 28.0 mmol/L   TCO2 >50 (H) 22 - 32 mmol/L   O2 Saturation 80 %   Acid-Base Excess 20.0 (H) 0.0 - 2.0 mmol/L   Sodium 138 135 - 145 mmol/L   Potassium 4.3 3.5 - 5.1 mmol/L   Calcium, Ion 1.12 (L) 1.15 - 1.40 mmol/L   HCT 29.0 (L) 36.0 - 46.0 %   Hemoglobin 9.9 (L) 12.0 - 15.0 g/dL   Sample type VENOUS    Comment NOTIFIED PHYSICIAN     No results found.  Review of Systems  All other systems reviewed and are negative.  Blood pressure 109/86, pulse (!) 111, resp. rate (!) 25, height '5\' 5"'$  (1.651 m), weight (!) 163.3 kg, SpO2 100 %. Physical Exam Constitutional:      Appearance: She is obese.  HENT:     Head: Normocephalic and atraumatic.     Right Ear: External ear normal.     Left Ear: External ear normal.     Nose: Nose normal.     Mouth/Throat:     Mouth: Mucous membranes are moist.     Pharynx: Oropharynx is clear.   Eyes:     Extraocular Movements: Extraocular movements intact.     Conjunctiva/sclera: Conjunctivae normal.     Pupils: Pupils are equal, round, and reactive to light.  Neck:     Comments: Cuffed trach in place with active bleeding from stoma.  Trach attached to ventilator. Cardiovascular:     Rate and Rhythm: Normal rate.  Pulmonary:     Effort: Pulmonary effort is normal.  Skin:    General: Skin is warm and dry.  Neurological:     General: No focal deficit present.     Mental Status: She is alert.     Assessment/Plan: Tracheostomy status, tracheostomy site bleeding, anti-coagulation  Most of a 6 foot strip of Vaseline gauze was packed in the stoma around the trach tube.  This will be removed in three days.  Eliquis is being held and the effect reversed, per the ER staff.  Melida Quitter 04/07/2022, 3:27 PM

## 2022-04-07 NOTE — Assessment & Plan Note (Signed)
From tracheostomy site now stopped.  -Usual wound care -Wean sedation.

## 2022-04-07 NOTE — ED Notes (Signed)
Purple man not placed  room number has been changed to 2h  another 20 minutes before  I can report

## 2022-04-07 NOTE — Progress Notes (Signed)
eLink Physician-Brief Progress Note Patient Name: Chelsea [name] DOB: 31-Jul-1966 MRN: [id number]   Date of Service  04/07/2022  HPI/Events of Note  71 F obese (BMI 60), hx of DM (A1C 5.9), HFpEF, afib on apixaban, hypothyroidism, OSA, chronic respiratory failure s/p trach last week. She was brought in from [facility] due to bleeding tracheostomy site after forceful coughing. Hypotensive on norepinephrine. Kcentra given. ENT consulted and trach site packed.  eICU Interventions  Bleeding trach site packed, apixaban and aspirin on hold. Chronic hypercapneic and hypoxemic respiratory failure. Vent dependent. Hypotension with no sign of hypoperfusion, will monitor for now. If with increasing pressor requirement or if without clinical improvement low threshold to start antibiotics for pneumonia. Follow cultures. Check TSH. DM on basal and SSI     Intervention Category Evaluation Type: New Patient Evaluation  Judd Lien 04/07/2022, 7:18 PM

## 2022-04-07 NOTE — Progress Notes (Signed)
Cross covering ICU physician  Called to eval pt for tracheal bleeding. Pt on levo and dopamine with tachycardia. Dopamine was stopped with some improvement in hr but need to escalate levo. Pt with continued tracheostomy site bleeding. However now coughing it up and over 100cc in canister. ENT en route for eval as well. At this time they have already packed the site in ED earlier this morning.   Family at bedside states that pt has been coughing profusely today which is new for her and overnight or early this am she had her NG tube replaced x2 which could have caused some irritation.   Hgb down to 7, transfused 1uprbc, plts stable and INR at last check was 1.4 after reversal agent administered in ED.    Repeat labs sent and Warren Lacy should follow these for appropriate transfusion orders or further reversal agents.   RN at bedside and updated.

## 2022-04-07 NOTE — ED Notes (Signed)
Admitting just walked in to see the pt

## 2022-04-07 NOTE — Assessment & Plan Note (Signed)
Remains ventilator dependent.  - Tolerating some pressure support  - Return to [facility] potentially tomorrow.

## 2022-04-07 NOTE — ED Provider Notes (Signed)
[facility] EMERGENCY DEPARTMENT Provider Note   CSN: [id number] Arrival date & time: 04/07/22  1354     History  Chief Complaint  Patient presents with   Bleeding from Stoma    Chelsea [name] is a 56 y.o. female.  56 year old female with prior medical history detailed below presents for evaluation.  Patient is arriving from [facility].  Patient with recent tracheostomy performed June 29 June 30th at [facility].  Patient is trach and vent dependent.  Patient is on Eliquis secondary to chronic atrial fibrillation.  Patient's last dose of Eliquis was this morning.  At approximately 1230 this afternoon the patient coughed.  Patient's daughter was in the room and noted significant bleeding from around the trach site.  Per daughter who is now at bedside, the patient's bleeding is significantly better now.  EMS reports the patient had Epi injected around the stoma to help reduce bleeding while still at [place].  The patient appears to have been on dopamine for the last several days per the daughter's report.  EMS could not report the length of time that dopamine was infused.   The patient is alert and comfortable.  Her primary concern at this time appears to be irritation from her NG tube.     The history is provided by the patient, the EMS personnel and medical records.  Illness      Home Medications Prior to Admission medications   Medication Sig Start Date End Date Taking? Authorizing Provider  acetaminophen (TYLENOL) 500 MG tablet Take 500 mg by mouth as needed for mild pain.    [provider]  albuterol (PROVENTIL) (2.5 MG/3ML) 0.083% nebulizer solution Take 3 mLs (2.5 mg total) by nebulization every 6 (six) hours as needed for wheezing or shortness of breath (I50.32). 07/04/16   [name], MD  albuterol (VENTOLIN HFA) 108 (90 BASE) MCG/ACT inhaler Inhale 2 puffs into the lungs every 6 (six) hours as needed for wheezing.  04/20/11 03/16/20  [name], MD  amiodarone (PACERONE) 200 MG tablet Take 200 mg by mouth daily. 04/12/21   [provider]  amLODipine (NORVASC) 5 MG tablet Take 5 mg by mouth daily. 04/14/18   [provider]  aspirin 81 MG tablet Take 81 mg by mouth daily.    [provider]  atorvastatin (LIPITOR) 20 MG tablet Take 20 mg by mouth daily. 06/22/18   [provider]  budesonide-formoterol (SYMBICORT) 160-4.5 MCG/ACT inhaler Inhale into the lungs. 08/28/17   [provider]  cetirizine (ZYRTEC) 10 MG tablet Take 10 mg by mouth daily.    [provider]  colesevelam (WELCHOL) 625 MG tablet Take 1,875 mg by mouth 2 (two) times daily. 02/10/21   [provider]  ELIQUIS 5 MG TABS tablet Take 5 mg by mouth 2 (two) times daily. 04/12/21   [provider]  famotidine (PEPCID) 20 MG tablet Take 20 mg by mouth daily. 04/12/21   [provider]  fenofibrate (TRICOR) 145 MG tablet Take 145 mg by mouth at bedtime. 04/12/21   [provider]  ferrous sulfate 325 (65 FE) MG tablet Take 325 mg by mouth daily with breakfast.    [provider]  furosemide (LASIX) 80 MG tablet Take 80 mg by mouth daily.    [provider]  gabapentin (NEURONTIN) 400 MG capsule TAKE 1 CAPSULE BY MOUTH THREE TIMES A DAY 03/28/18   [provider]  glucosamine-chondroitin 500-400 MG tablet Take 1  tablet by mouth 2 (two) times daily.    [provider]  levothyroxine (SYNTHROID, LEVOTHROID) 112 MCG tablet Take 112 mcg by mouth daily. 06/03/18   [provider]  Lidocaine-Menthol 3.6-1.25 % PTCH Place onto the skin. Patient not taking: No sig reported 11/25/17   [provider]  losartan (COZAAR) 25 MG tablet Take 25 mg by mouth daily.  03/23/16   [provider]  metFORMIN (GLUCOPHAGE-XR) 500 MG 24 hr tablet Take 1 tablet by mouth 2 (two) times daily. 05/05/19   [provider]   metoprolol succinate (TOPROL-XL) 25 MG 24 hr tablet Take 25 mg by mouth daily. 04/12/21   [provider]  Multiple Vitamin (MULTIVITAMIN) tablet Take 1 tablet by mouth daily.    [provider]  NON FORMULARY Oxygen 2 liters continuous and cpap    [provider]  potassium chloride SA (K-DUR,KLOR-CON) 20 MEQ tablet Take 20 mEq by mouth daily. 03/03/18   [provider]  rOPINIRole (REQUIP) 2 MG tablet Take 2 mg by mouth at bedtime. 04/12/21   [provider]  TRADJENTA 5 MG TABS tablet Take 5 mg by mouth daily. 12/01/17   [provider]  traMADol (ULTRAM) 50 MG tablet Take 50 mg by mouth 3 (three) times daily as needed for moderate pain. 03/13/20   [provider]  TRULICITY 1.5 QQ/7.6PP SOPN Inject 1.5 mg into the skin once a week. 04/13/21   [provider]  Grant Ruts INHUB 250-50 MCG/ACT AEPB Inhale 1 puff into the lungs 2 (two) times daily. 04/13/21   [provider]      Allergies    Penicillins and Minoxidil    Review of Systems   Review of Systems  All other systems reviewed and are negative.   Physical Exam Updated Vital Signs BP 100/73   Pulse 100   Resp 19   Ht '5\' 5"'$  (1.651 m)   Wt (!) 163.3 kg   SpO2 98%   BMI 59.91 kg/m  Physical Exam Vitals and nursing note reviewed.  Constitutional:      General: She is not in acute distress.    Appearance: She is well-developed.     Comments: Chronically ill in appearance  Because wrapped tightly around trach stoma is blood soaked with significant clot.  HENT:     Head: Normocephalic and atraumatic.  Eyes:     Conjunctiva/sclera: Conjunctivae normal.     Pupils: Pupils are equal, round, and reactive to light.  Cardiovascular:     Rate and Rhythm: Regular rhythm. Tachycardia present.     Heart sounds: Normal heart sounds.  Pulmonary:     Effort: Pulmonary effort is normal. No respiratory distress.     Breath sounds: Normal breath sounds.  Abdominal:      General: There is no distension.     Palpations: Abdomen is soft.     Tenderness: There is no abdominal tenderness.  Musculoskeletal:        General: No deformity. Normal range of motion.     Cervical back: Normal range of motion and neck supple.  Skin:    General: Skin is warm and dry.  Neurological:     General: No focal deficit present.     Mental Status: She is alert.     ED Results / Procedures / Treatments   Labs (all labs ordered are listed, but only abnormal results are displayed) Labs Reviewed  CBC WITH DIFFERENTIAL/PLATELET - Abnormal; Notable for the following components:  Result Value   WBC 14.0 (*)    RBC 3.10 (*)    Hemoglobin 8.0 (*)    HCT 28.3 (*)    MCH 25.8 (*)    MCHC 28.3 (*)    Neutro Abs 13.1 (*)    Lymphs Abs 0.3 (*)    All other components within normal limits  I-STAT CHEM 8, ED - Abnormal; Notable for the following components:   Chloride 87 (*)    BUN 31 (*)    Glucose, Bld 388 (*)    Calcium, Ion 1.11 (*)    TCO2 45 (*)    Hemoglobin 9.9 (*)    HCT 29.0 (*)    All other components within normal limits  I-STAT VENOUS BLOOD GAS, ED - Abnormal; Notable for the following components:   pCO2, Ven 75.0 (*)    pO2, Ven 47 (*)    Bicarbonate 48.1 (*)    TCO2 >50 (*)    Acid-Base Excess 20.0 (*)    Calcium, Ion 1.12 (*)    HCT 29.0 (*)    Hemoglobin 9.9 (*)    All other components within normal limits  PROTIME-INR  COMPREHENSIVE METABOLIC PANEL  LACTIC ACID, PLASMA  LACTIC ACID, PLASMA  TYPE AND SCREEN  TROPONIN I (HIGH SENSITIVITY)    EKG EKG Interpretation  Date/Time:  Saturday April 07 2022 14:18:09 EDT Ventricular Rate:  100 PR Interval:  127 QRS Duration: 94 QT Interval:  327 QTC Calculation: 422 R Axis:   91 Text Interpretation: Sinus or ectopic atrial tachycardia Borderline right axis deviation Minimal ST depression, diffuse leads Confirmed by Dene Gentry [phone]) on 04/07/2022 2:28:47 PM  Radiology No results  found.  Procedures Procedures    Medications Ordered in ED Medications  prothrombin complex conc human (KCENTRA) IVPB 4,296 Units (has no administration in time range)    ED Course/ Medical Decision Making/ A&P                           Medical Decision Making Amount and/or Complexity of Data Reviewed Labs: ordered. Radiology: ordered.    Medical Screen Complete  This patient presented to the ED with complaint of bleeding from around recent trach site.  This complaint involves an extensive number of treatment options. The initial differential diagnosis includes, but is not limited to, significant bleeding, metabolic abnormality, etc.  This presentation is: Acute, Self-Limited, Previously Undiagnosed, Uncertain Prognosis, Complicated, Systemic Symptoms, and Threat to Life/Bodily Function  Patient arrives from [facility].  Patient with recent trach placed approximately June 29 or June 30 at [facility] facility.  Patient is on Eliquis.  Patient with significant bleeding from around stoma starting this afternoon around 1230.  Patient would likely benefit from reversal of her Eliquis.  Additionally patient will require admission for observation and further management.  Critical care service is aware of case.  Dr. Redmond Baseman with ENT is also aware and will evaluate the patient in the ED.    Co morbidities that complicated the patient's evaluation  On chronic anticoagulation   Additional history obtained:  Additional history obtained from [facility] External records from outside sources obtained and reviewed including prior ED visits and prior Inpatient records.    Lab Tests:  I ordered and personally interpreted labs.  The pertinent results include: I-STAT Chem-8, i-STAT VBG, CBC, INR, CMP, lactic acid, type and screen   Imaging Studies ordered:  I ordered imaging studies including chest x-ray I agree with the radiologist interpretation.  Cardiac Monitoring:  The patient  was maintained on a cardiac monitor.  I personally viewed and interpreted the cardiac monitor which showed an underlying rhythm of: Sinus tach   Medicines ordered:  I ordered medication including Kcentra for reversal of Eliquis Reevaluation of the patient after these medicines showed that the patient: stayed the same  Problem List / ED Course:  Bleeding on anticoagulation   Reevaluation:  After the interventions noted above, I reevaluated the patient and found that they have: stayed the same   Disposition:  After consideration of the diagnostic results and the patients response to treatment, I feel that the patent would benefit from admission.   CRITICAL CARE Performed by: Valarie Merino   Total critical care time: 45 minutes  Critical care time was exclusive of separately billable procedures and treating other patients.  Critical care was necessary to treat or prevent imminent or life-threatening deterioration.  Critical care was time spent personally by me on the following activities: development of treatment plan with patient and/or surrogate as well as nursing, discussions with consultants, evaluation of patient's response to treatment, examination of patient, obtaining history from patient or surrogate, ordering and performing treatments and interventions, ordering and review of laboratory studies, ordering and review of radiographic studies, pulse oximetry and re-evaluation of patient's condition.          Final Clinical Impression(s) / ED Diagnoses Final diagnoses:  Bleeding    Rx / DC Orders ED Discharge Orders     None         Valarie Merino, MD 04/07/22 1512

## 2022-04-07 NOTE — Progress Notes (Addendum)
Pharmacy Antibiotic Note  Chelsea [name] is a 56 y.o. female admitted on 04/07/2022 with pneumonia.  Pharmacy has been consulted for cefepime dosing. -WBC= 26, afebrile, SCr= 0.6  Plan: -Cefepime 2gm IV q8h -Will follow renal function, cultures and clinical progress   Height: '5\' 5"'$  (165.1 cm) Weight: (!) 163.3 kg (360 lb) IBW/kg (Calculated) : 57  Temp (24hrs), Avg:100.2 F (37.9 C), Min:99.8 F (37.7 C), Max:100.5 F (38.1 C)  Recent Labs  Lab 04/07/22 1415 04/07/22 1436 04/07/22 1604 04/07/22 2024  WBC 14.0*  --   --  36.4*  CREATININE 0.57 0.60  --   --   LATICACIDVEN 0.9  --  1.6  --     Estimated Creatinine Clearance: 120.4 mL/min (by C-G formula based on SCr of 0.6 mg/dL).    Allergies  Allergen Reactions   Penicillins Anaphylaxis    Patient tolerates cefepime (08/2017)   Minoxidil     Other reaction(s): hives     Thank you for allowing pharmacy to be a part of this patient's care.  Hildred Laser, PharmD Clinical Pharmacist **Pharmacist phone directory can now be found on [email or web address] ([email or web address]).  Listed under [place].

## 2022-04-07 NOTE — ED Triage Notes (Signed)
Pt brought in by [facility] from [facility]. Pt is bleeding from stoma. Pt was given atropine x2 and epi x2 around stoma. Pt is on eliquis. Pt has been there for 13 days. Bleeding started at 1230 today. [facility] states copius amounts of blood. Pt 2 mcg/kilo on dopamine which was started temporarily. Pt is able to get blood out from stoma, not swallowing it.

## 2022-04-07 NOTE — H&P (Addendum)
NAME:  Chelsea [name], MRN:  [id number], DOB:  April 24, 1966, LOS: 0 ADMISSION DATE:  04/07/2022, CONSULTATION DATE:  04/07/2022 REFERRING MD:  Dr. Francia Greaves, CHIEF COMPLAINT:  Bleeding trach    History of Present Illness:  Chelsea [name] is a 56 y.o. female who resides at [facility] with history of CHF with CAD, COPD, OSA, atrial fibrillation HLD, HTN, diabetes, and chronic back pain who presented to the ED from LTAC due to bleeding tracheostomy.  Per family tracheostomy was performed last week at [place].  Daughter reports patient has had significant forceful cough that began overnight and into today which she believes has led to trach bleeding.  Per family patient has not progressed to trach collar trials yet at [place].  On ED arrival patient was seen with mild tachycardia and eventual development of mild hypotension but remained alert and oriented.  Lab work on admission significant for chloride 88, glucose 384, BUN 29, albumin 2.8, AST 10, hemoglobin 8.0, INR 1.7.  ENT was consulted as well as PCCM for management of profusely bleeding tracheostomy.  ENT was able to pack the trach in the emergency department with control and bleeding.  And will admit and monitor in ICU for continued need of ventilator support  Pertinent  Medical History   CHF with CAD, COPD, atrial fibrillation OSA, HLD, HTN, diabetes, and chronic back pain  Significant Hospital Events: Including procedures, antibiotic start and stop dates in addition to other pertinent events   7/8 presented from [facility] with profusely bleeding tracheostomy  Interim History / Subjective:  Seen lying in bed in mild discomfort from pain at trach site but alert and interactive on vent.  Family at bedside and updated  Objective   Blood pressure 109/86, pulse (!) 111, resp. rate (!) 25, height '5\' 5"'$  (1.651 m), weight (!) 163.3 kg, SpO2 100 %.    Vent Mode: PCV FiO2 (%):  [100 %] 100 % Set Rate:  [12 bmp] 12 bmp PEEP:  [5 cmH20] 5  cmH20  No intake or output data in the 24 hours ending 04/07/22 1520 Filed Weights   04/07/22 1403  Weight: (!) 163.3 kg    Examination: General: Acute on chronic ill-appearing middle-aged morbidly obese female lying in bed on mechanical ventilation through chronic tracheostomy in no acute distress HEENT: 7 cuffed trach midline, MM pink/moist, PERRL,  Neuro: Alert and interactive on ventilator, able to follow simple commands CV: s1s2 regular rate and rhythm, no murmur, rubs, or gallops,  PULM: Slightly diminished bilaterally but no added breath sounds auscultated no increased work of breathing, tolerating ventilator GI: soft, bowel sounds active in all 4 quadrants, non-tender, non-distended Extremities: warm/dry, generalized nonpitting edema  Skin: no rashes or lesions  Resolved Hospital Problem list     Assessment & Plan:  Bleeding tracheostomy -Patient presented from [facility] due to profusely bleeding tracheostomy.  Per family tracheostomy was placed at [place] 1 week prior to admission.  Daughter states patient has progressively developed forceful cough over the last 24 hours and feels as if this may be leading factor to bleeding P: ENT evaluated and assessed in ED and packed trach Minimize suctioning as able Leave packing in place, removal per ENT Trend hemoglobin hematocrit Transfuse per protocol Hold home anticoagulation   Obstructive sleep apnea with OHS  Chronic hypoxic respiratory failure now status post percutaneous tracheostomy with vent dependence -Per care everywhere patient utilizes 4 L nasal cannula at baseline prior to recent hospitalization Concern for possible evolving ventilator associated  pneumonia -Chest x-ray on admission revealed patchy opacities throughout right lung and left lung however this is in the setting of bleeding tracheostomy P: Daily attempts at ventilator wean to allow for eventual transition to ATC Continue ventilator support with  lung protective strategies  Wean PEEP and FiO2 for sats greater than 90%. Head of bed elevated 30 degrees. Plateau pressures less than 30 cm H20.  Follow intermittent chest x-ray and ABG.   SAT/SBT as tolerated, mentation preclude extubation  Ensure adequate pulmonary hygiene  Follow cultures  VAP bundle in place  PAD protocol Obtain respiratory culture Empiric antibiotics  History of diastolic congestive heart failure -Per care everywhere most recent echocardiogram January 2023 revealed EF of 55 to 60% with moderate aortic stenosis.  Chronic atrial fibrillation anticoagulated with Eliquis History of hypertension/hyperlipidemia Moderate aortic stenosis P: Presented from [facility] on low dose Dopamine drip, unsure duration or rational to use at this time. Will continue until full workup completed  Continuous telemetry  Obtain echocardiogram Obtain BNP Strict intake and output  Daily weight to assess volume status Daily assessment for need to diurese with the use of IV lasix  Closely monitor renal function and electrolytes  Vent support as above Hold home anticoagulation given bleeding as above Continue statin  Type 2 diabetes P: SSI CBG goal 140-180  Morbid obesity P: Resume tube feeds likely tomorrow Dietitian consultation Optimize nutrition   Best Practice (right click and "Reselect all SmartList Selections" daily)   Diet/type: NPO DVT prophylaxis: SCD GI prophylaxis: PPI Lines: N/A Foley:  Yes, and it is still needed Code Status:  full code Last date of multidisciplinary goals of care discussion: Patient and family updated at bedside  Labs   CBC: Recent Labs  Lab 04/07/22 1415 04/07/22 1436  WBC 14.0*  --   NEUTROABS 13.1*  --   HGB 8.0* 9.9*  9.9*  HCT 28.3* 29.0*  29.0*  MCV 91.3  --   PLT 340  --     Basic Metabolic Panel: Recent Labs  Lab 04/07/22 1415 04/07/22 1436  NA 141 138  138  K 4.4 4.3  4.3  CL 88* 87*  CO2 42*  --   GLUCOSE  384* 388*  BUN 29* 31*  CREATININE 0.57 0.60  CALCIUM 8.5*  --    GFR: Estimated Creatinine Clearance: 120.4 mL/min (by C-G formula based on SCr of 0.6 mg/dL). Recent Labs  Lab 04/07/22 1415  WBC 14.0*  LATICACIDVEN 0.9    Liver Function Tests: Recent Labs  Lab 04/07/22 1415  AST 10*  ALT 24  ALKPHOS 62  BILITOT 0.3  PROT 6.2*  ALBUMIN 2.8*   No results for input(s): "LIPASE", "AMYLASE" in the last 168 hours. No results for input(s): "AMMONIA" in the last 168 hours.  ABG    Component Value Date/Time   PHART 7.364 12/05/2017 1035   PCO2ART 69.1 (HH) 12/05/2017 1035   PO2ART 130.0 (H) 12/05/2017 1035   HCO3 48.1 (H) 04/07/2022 1436   TCO2 45 (H) 04/07/2022 1436   TCO2 >50 (H) 04/07/2022 1436   O2SAT 80 04/07/2022 1436     Coagulation Profile: Recent Labs  Lab 04/07/22 1415  INR 1.4*    Cardiac Enzymes: No results for input(s): "CKTOTAL", "CKMB", "CKMBINDEX", "TROPONINI" in the last 168 hours.  HbA1C: Hgb A1c MFr Bld  Date/Time Value Ref Range Status  12/05/2017 06:36 PM 7.3 (H) 4.8 - 5.6 % Final    Comment:    (NOTE)  Prediabetes: 5.7 - 6.4         Diabetes: >6.4         Glycemic control for adults with diabetes: <7.0   03/22/2011 03:58 PM 7.0 (H) 4.6 - 6.5 % Final    Comment:    Glycemic Control Guidelines for People with Diabetes:Non Diabetic:  <6%Goal of Therapy: <7%Additional Action Suggested:  >8%     CBG: No results for input(s): "GLUCAP" in the last 168 hours.  Review of Systems:   Please see the history of present illness. All other systems reviewed and are negative   Past Medical History:  She,  has a past medical history of Aortic stenosis, Arthritis, Asthma, Cervical cancer ([place]) (2006), CHF (congestive heart failure) ([place]), Chronic lower back pain, Complication of anesthesia, COPD (chronic obstructive pulmonary disease) ([place]), Coronary artery disease, Diastolic congestive heart failure ([place]), GERD (gastroesophageal reflux  disease), Heart murmur, Hepatitis C, History of gout (X 1), Hyperlipidemia, Hypertension, Hypothyroid, IBS (irritable bowel syndrome), Migraine, Obesity hypoventilation syndrome ([place]), On home oxygen therapy, OSA treated with BiPAP, Pneumonia, and Type II diabetes mellitus ([place]).   Surgical History:   Past Surgical History:  Procedure Laterality Date   ABDOMINAL SURGERY  2006 X 2   "TAH incision was infected"   CHOLECYSTECTOMY OPEN     TONSILLECTOMY     TOTAL ABDOMINAL HYSTERECTOMY  2006     Social History:   reports that she has never smoked. She has never used smokeless tobacco. She reports current alcohol use. She reports that she does not use drugs.   Family History:  Her family history includes Heart disease in her father.   Allergies Allergies  Allergen Reactions   Penicillins Anaphylaxis    Patient tolerates cefepime (08/2017)   Minoxidil     Other reaction(s): hives     Home Medications  Prior to Admission medications   Medication Sig Start Date End Date Taking? Authorizing Provider  acetaminophen (TYLENOL) 500 MG tablet Take 500 mg by mouth as needed for mild pain.    [provider]  albuterol (PROVENTIL) (2.5 MG/3ML) 0.083% nebulizer solution Take 3 mLs (2.5 mg total) by nebulization every 6 (six) hours as needed for wheezing or shortness of breath (I50.32). 07/04/16   [name], MD  albuterol (VENTOLIN HFA) 108 (90 BASE) MCG/ACT inhaler Inhale 2 puffs into the lungs every 6 (six) hours as needed for wheezing. 04/20/11 03/16/20  [name], MD  amiodarone (PACERONE) 200 MG tablet Take 200 mg by mouth daily. 04/12/21   [provider]  amLODipine (NORVASC) 5 MG tablet Take 5 mg by mouth daily. 04/14/18   [provider]  aspirin 81 MG tablet Take 81 mg by mouth daily.    [provider]  atorvastatin (LIPITOR) 20 MG tablet Take 20 mg by mouth daily. 06/22/18   [provider]  budesonide-formoterol (SYMBICORT) 160-4.5  MCG/ACT inhaler Inhale into the lungs. 08/28/17   [provider]  cetirizine (ZYRTEC) 10 MG tablet Take 10 mg by mouth daily.    [provider]  colesevelam (WELCHOL) 625 MG tablet Take 1,875 mg by mouth 2 (two) times daily. 02/10/21   [provider]  ELIQUIS 5 MG TABS tablet Take 5 mg by mouth 2 (two) times daily. 04/12/21   [provider]  famotidine (PEPCID) 20 MG tablet Take 20 mg by mouth daily. 04/12/21   [provider]  fenofibrate (TRICOR) 145 MG tablet Take 145 mg by mouth at bedtime. 04/12/21  [provider]  ferrous sulfate 325 (65 FE) MG tablet Take 325 mg by mouth daily with breakfast.    [provider]  furosemide (LASIX) 80 MG tablet Take 80 mg by mouth daily.    [provider]  gabapentin (NEURONTIN) 400 MG capsule TAKE 1 CAPSULE BY MOUTH THREE TIMES A DAY 03/28/18   [provider]  glucosamine-chondroitin 500-400 MG tablet Take 1 tablet by mouth 2 (two) times daily.    [provider]  levothyroxine (SYNTHROID, LEVOTHROID) 112 MCG tablet Take 112 mcg by mouth daily. 06/03/18   [provider]  Lidocaine-Menthol 3.6-1.25 % PTCH Place onto the skin. Patient not taking: No sig reported 11/25/17   [provider]  losartan (COZAAR) 25 MG tablet Take 25 mg by mouth daily.  03/23/16   [provider]  metFORMIN (GLUCOPHAGE-XR) 500 MG 24 hr tablet Take 1 tablet by mouth 2 (two) times daily. 05/05/19   [provider]  metoprolol succinate (TOPROL-XL) 25 MG 24 hr tablet Take 25 mg by mouth daily. 04/12/21   [provider]  Multiple Vitamin (MULTIVITAMIN) tablet Take 1 tablet by mouth daily.    [provider]  NON FORMULARY Oxygen 2 liters continuous and cpap    [provider]  potassium chloride SA (K-DUR,KLOR-CON) 20 MEQ tablet Take 20 mEq by mouth daily. 03/03/18   [provider]  rOPINIRole (REQUIP) 2 MG tablet Take 2 mg by  mouth at bedtime. 04/12/21   [provider]  TRADJENTA 5 MG TABS tablet Take 5 mg by mouth daily. 12/01/17   [provider]  traMADol (ULTRAM) 50 MG tablet Take 50 mg by mouth 3 (three) times daily as needed for moderate pain. 03/13/20   [provider]  TRULICITY 1.5 KG/8.1EH SOPN Inject 1.5 mg into the skin once a week. 04/13/21   [provider]  Grant Ruts INHUB 250-50 MCG/ACT AEPB Inhale 1 puff into the lungs 2 (two) times daily. 04/13/21   [provider]     Critical care time: 40 mins  Kenyette Gundy D. [place] Kingfisher, NP-C [facility] Personal contact information can be found on Amion  04/07/2022, 4:23 PM

## 2022-04-07 NOTE — ED Notes (Signed)
No purple man yet  they will let me know

## 2022-04-07 NOTE — Assessment & Plan Note (Signed)
Unclear significance. May be due to inaccurate cuff pressure as patient mentating and urinating.  - Wean NE to keep MAP >65.  May be sedation related in part.  - Once off sedation and awake, will be ready to transfer back to [facility].

## 2022-04-07 NOTE — ED Notes (Signed)
Labs added to blod in lab

## 2022-04-07 NOTE — Progress Notes (Signed)
RT transported pt on ventilator from ED28 to [place]. Pt tolerated fairly well, no apparent complications. Profuse bleeding from stoma. Vitals stable throughout, RN at bedside.

## 2022-04-07 NOTE — Progress Notes (Signed)
Pt placed on settings from [facility]. Trach bleeding profusely, towel and gauze applied by [facility] to try and stop bleeding.

## 2022-04-08 ENCOUNTER — Other Ambulatory Visit: Payer: Self-pay

## 2022-04-08 ENCOUNTER — Inpatient Hospital Stay (HOSPITAL_COMMUNITY): Payer: Medicare Other

## 2022-04-08 DIAGNOSIS — R049 Hemorrhage from respiratory passages, unspecified: Secondary | ICD-10-CM | POA: Diagnosis not present

## 2022-04-08 DIAGNOSIS — R0609 Other forms of dyspnea: Secondary | ICD-10-CM

## 2022-04-08 LAB — POCT I-STAT 7, (LYTES, BLD GAS, ICA,H+H)
Acid-Base Excess: 14 mmol/L — ABNORMAL HIGH (ref 0.0–2.0)
Bicarbonate: 41.2 mmol/L — ABNORMAL HIGH (ref 20.0–28.0)
Calcium, Ion: 1.17 mmol/L (ref 1.15–1.40)
HCT: 25 % — ABNORMAL LOW (ref 36.0–46.0)
Hemoglobin: 8.5 g/dL — ABNORMAL LOW (ref 12.0–15.0)
O2 Saturation: 92 %
Patient temperature: 98.7
Potassium: 3.5 mmol/L (ref 3.5–5.1)
Sodium: 138 mmol/L (ref 135–145)
TCO2: 43 mmol/L — ABNORMAL HIGH (ref 22–32)
pCO2 arterial: 73.6 mmHg (ref 32–48)
pH, Arterial: 7.357 (ref 7.35–7.45)
pO2, Arterial: 72 mmHg — ABNORMAL LOW (ref 83–108)

## 2022-04-08 LAB — MAGNESIUM
Magnesium: 2 mg/dL (ref 1.7–2.4)
Magnesium: 1.9 mg/dL (ref 1.7–2.4)

## 2022-04-08 LAB — BASIC METABOLIC PANEL
Anion gap: 11 (ref 5–15)
BUN: 47 mg/dL — ABNORMAL HIGH (ref 6–20)
CO2: 38 mmol/L — ABNORMAL HIGH (ref 22–32)
Calcium: 8.3 mg/dL — ABNORMAL LOW (ref 8.9–10.3)
Chloride: 90 mmol/L — ABNORMAL LOW (ref 98–111)
Creatinine, Ser: 1.27 mg/dL — ABNORMAL HIGH (ref 0.44–1.00)
GFR, Estimated: 49 mL/min — ABNORMAL LOW (ref >60)
Glucose, Bld: 247 mg/dL — ABNORMAL HIGH (ref 70–99)
Potassium: 3.5 mmol/L (ref 3.5–5.1)
Sodium: 139 mmol/L (ref 135–145)

## 2022-04-08 LAB — URINALYSIS, ROUTINE W REFLEX MICROSCOPIC
Bilirubin Urine: NEGATIVE
Glucose, UA: NEGATIVE mg/dL
Ketones, ur: NEGATIVE mg/dL
Nitrite: NEGATIVE
Protein, ur: 100 mg/dL — AB
Specific Gravity, Urine: 1.021 (ref 1.005–1.030)
pH: 5 (ref 5.0–8.0)

## 2022-04-08 LAB — BLOOD CULTURE ID PANEL (REFLEXED) - BCID2

## 2022-04-08 LAB — ECHOCARDIOGRAM COMPLETE
AR max vel: 1.37 cm2
AV Area VTI: 1.26 cm2
AV Area mean vel: 1.26 cm2
AV Mean grad: 38 mmHg
AV Peak grad: 59 mmHg
Ao pk vel: 3.84 m/s
Height: 65 in
S' Lateral: 3.4 cm
Weight: 5322.79 oz

## 2022-04-08 LAB — GLUCOSE, CAPILLARY
Glucose-Capillary: 181 mg/dL — ABNORMAL HIGH (ref 70–99)
Glucose-Capillary: 193 mg/dL — ABNORMAL HIGH (ref 70–99)
Glucose-Capillary: 200 mg/dL — ABNORMAL HIGH (ref 70–99)
Glucose-Capillary: 258 mg/dL — ABNORMAL HIGH (ref 70–99)
Glucose-Capillary: 272 mg/dL — ABNORMAL HIGH (ref 70–99)
Glucose-Capillary: 283 mg/dL — ABNORMAL HIGH (ref 70–99)
Glucose-Capillary: 329 mg/dL — ABNORMAL HIGH (ref 70–99)

## 2022-04-08 LAB — CBC
HCT: 24.7 % — ABNORMAL LOW (ref 36.0–46.0)
Hemoglobin: 7.3 g/dL — ABNORMAL LOW (ref 12.0–15.0)
MCH: 26.4 pg (ref 26.0–34.0)
MCHC: 29.6 g/dL — ABNORMAL LOW (ref 30.0–36.0)
MCV: 89.5 fL (ref 80.0–100.0)
Platelets: 405 10*3/uL — ABNORMAL HIGH (ref 150–400)
RBC: 2.76 MIL/uL — ABNORMAL LOW (ref 3.87–5.11)
RDW: 15.8 % — ABNORMAL HIGH (ref 11.5–15.5)
WBC: 36.8 10*3/uL — ABNORMAL HIGH (ref 4.0–10.5)
nRBC: 0 % (ref 0.0–0.2)

## 2022-04-08 LAB — MRSA NEXT GEN BY PCR, NASAL: MRSA by PCR Next Gen: NOT DETECTED

## 2022-04-08 LAB — PHOSPHORUS
Phosphorus: 4.3 mg/dL (ref 2.5–4.6)
Phosphorus: 3.9 mg/dL (ref 2.5–4.6)

## 2022-04-08 LAB — LACTIC ACID, PLASMA: Lactic Acid, Venous: 1.2 mmol/L (ref 0.5–1.9)

## 2022-04-08 MED ORDER — ORAL CARE MOUTH RINSE
15.0000 mL | OROMUCOSAL | Status: DC
Start: 2022-04-08 — End: 2022-04-10
  Administered 2022-04-08 – 2022-04-10 (×9): 15 mL via OROMUCOSAL

## 2022-04-08 MED ORDER — SODIUM CHLORIDE 0.9 % IV SOLN
100.0000 mg | Freq: Once | INTRAVENOUS | Status: AC
  Administered 2022-04-08: 100 mg via INTRAVENOUS
  Filled 2022-04-08: qty 2

## 2022-04-08 MED ORDER — PROSOURCE TF PO LIQD
45.0000 mL | Freq: Two times a day (BID) | ORAL | Status: DC
  Administered 2022-04-08 – 2022-04-09 (×3): 45 mL
  Filled 2022-04-08 (×3): qty 45

## 2022-04-08 MED ORDER — IRON DEXTRAN 50 MG/ML IJ SOLN
100.0000 mg | Freq: Once | INTRAMUSCULAR | Status: DC
Start: 2022-04-08 — End: 2022-04-08
  Filled 2022-04-08: qty 2

## 2022-04-08 MED ORDER — LACTATED RINGERS IV BOLUS
500.0000 mL | Freq: Once | INTRAVENOUS | Status: AC
  Administered 2022-04-08: 500 mL via INTRAVENOUS

## 2022-04-08 MED ORDER — ORAL CARE MOUTH RINSE: 15.0000 mL | OROMUCOSAL | Status: DC | PRN

## 2022-04-08 MED ORDER — SODIUM CHLORIDE 0.9 % IV SOLN: INTRAVENOUS | Status: DC

## 2022-04-08 MED ORDER — GUAIFENESIN-CODEINE 100-10 MG/5ML PO SOLN
10.0000 mL | Freq: Four times a day (QID) | ORAL | Status: DC | PRN
  Administered 2022-04-09 (×2): 10 mL
  Filled 2022-04-08 (×2): qty 10

## 2022-04-08 MED ORDER — CHLORHEXIDINE GLUCONATE CLOTH 2 % EX PADS
6.0000 | MEDICATED_PAD | Freq: Every day | CUTANEOUS | Status: DC
  Administered 2022-04-08 – 2022-04-22 (×18): 6 via TOPICAL

## 2022-04-08 MED ORDER — MIDODRINE HCL 5 MG PO TABS
10.0000 mg | ORAL_TABLET | Freq: Three times a day (TID) | ORAL | Status: DC
  Administered 2022-04-08 – 2022-04-14 (×18): 10 mg
  Filled 2022-04-08 (×18): qty 2

## 2022-04-08 MED ORDER — VITAL HIGH PROTEIN PO LIQD
1000.0000 mL | ORAL | Status: DC
  Administered 2022-04-08 – 2022-04-09 (×3): 1000 mL

## 2022-04-08 MED ORDER — POTASSIUM CHLORIDE 10 MEQ/100ML IV SOLN
10.0000 meq | INTRAVENOUS | Status: AC
  Administered 2022-04-08 (×2): 10 meq via INTRAVENOUS
  Filled 2022-04-08 (×2): qty 100

## 2022-04-08 MED ORDER — NOREPINEPHRINE 16 MG/250ML-% IV SOLN
0.0000 ug/min | INTRAVENOUS | Status: DC
  Administered 2022-04-08: 30 ug/min via INTRAVENOUS
  Administered 2022-04-08: 40 ug/min via INTRAVENOUS
  Administered 2022-04-09 (×2): 30 ug/min via INTRAVENOUS
  Administered 2022-04-10: 15 ug/min via INTRAVENOUS
  Administered 2022-04-10: 20 ug/min via INTRAVENOUS
  Filled 2022-04-08 (×6): qty 250

## 2022-04-08 MED ORDER — POTASSIUM CHLORIDE 20 MEQ PO PACK: 20.0000 meq | PACK | Freq: Once | ORAL | Status: DC

## 2022-04-08 NOTE — Consult Note (Signed)
WOC Nurse Consult Note: Reason for Consult:Stage 2 pressure injury to intertriginous skin fold over coccyx. Presents as an intact, serum-filled blister Wound type:Pressure, shear plus moisture Pressure Injury POA: Yes Measurement:Bedside RN to measure and document measurements on Nursing Flow Sheet with first dressing change today Wound bed:Intact serum-filled blister Drainage (amount, consistency, odor) N/A Periwound:Moist Dressing procedure/placement/frequency:Patient is in the ICU and is on a mattress replacement with low air loss feature for pressure redistribution and moisture/microclimate mitigation. She is being turned and repositioned per house protocol. Guidance is provided to minimize time in the supine position. Bilateral heel pressure redistribution boots (Prevalon) are provided. Topical care will be with a NS cleanse, followed by placement of an antimicrobial nonadherent gauze (xeroform). This will be covered with a silicone foam dressing placed with the tip oriented to point away from the anus. Patient with multiple comorbid conditions that add to risk of pressure injury. Patient's respiratory status and need for [facility] elevation presents an added risk for coccygeal and sacral PI. The above measures are intended to  reduce but cannot eliminate risk.  [place] nursing team will not follow, but will remain available to this patient, the nursing and medical teams.  Please re-consult if needed.  Thank you for inviting [place] to participate in this patient's Plan of Care.  Maudie Flakes, MSN, RN, CNS, [place], Serita Grammes, [employer], [employer] phone:  [phone]

## 2022-04-08 NOTE — H&P (Signed)
NAME:  Chelsea [name], MRN:  [id number], DOB:  [birth year], LOS: 1 ADMISSION DATE:  04/07/2022, CONSULTATION DATE:  04/07/2022 REFERRING MD:  Dr. Francia Greaves, CHIEF COMPLAINT:  Bleeding trach    History of Present Illness:  Chelsea [name] is a 56 y.o. female who resides at [facility] with history of CHF with CAD, COPD, OSA, atrial fibrillation HLD, HTN, diabetes, and chronic back pain who presented to the ED from LTAC due to bleeding tracheostomy.  Per family tracheostomy was performed last week at [place].  Daughter reports patient has had significant forceful cough that began overnight and into today which she believes has led to trach bleeding.  Per family patient has not progressed to trach collar trials yet at [place].  On ED arrival patient was seen with mild tachycardia and eventual development of mild hypotension but remained alert and oriented.  Lab work on admission significant for chloride 88, glucose 384, BUN 29, albumin 2.8, AST 10, hemoglobin 8.0, INR 1.7.  ENT was consulted as well as PCCM for management of profusely bleeding tracheostomy.  ENT was able to pack the trach in the emergency department with control and bleeding.  And will admit and monitor in ICU for continued need of ventilator support  Pertinent  Medical History   CHF with CAD, COPD, atrial fibrillation OSA, HLD, HTN, diabetes, and chronic back pain  Significant Hospital Events: Including procedures, antibiotic start and stop dates in addition to other pertinent events   7/8 presented from [facility] with profusely bleeding tracheostomy, packed twice by ENT   Objective   Blood pressure (!) 80/44, pulse 97, temperature 99 F (37.2 C), temperature source Oral, resp. rate (!) 21, height '5\' 5"'$  (1.651 m), weight (!) 150.9 kg, SpO2 (!) 89 %.    Vent Mode: PCV FiO2 (%):  [60 %-100 %] 60 % Set Rate:  [12 bmp] 12 bmp PEEP:  [5 cmH20-8 cmH20] 8 cmH20 Plateau Pressure:  [25 cmH20-27 cmH20] 27 cmH20    Intake/Output Summary (Last 24 hours) at 04/08/2022 1500 Last data filed at 04/08/2022 1000 Gross per 24 hour  Intake 3851.76 ml  Output 994 ml  Net 2857.76 ml   Filed Weights   04/07/22 1403 04/08/22 0500  Weight: (!) 163.3 kg (!) 150.9 kg    Examination: General: Acute on chronic ill-appearing middle-aged morbidly obese female lying in bed on mechanical ventilation through chronic tracheostomy in no acute distress HEENT: 7 cuffed trach midline, MM pink/moist, PERRL, dry clotted blood only Neuro: Alert and interactive on ventilator, able to follow simple commands CV: s1s2 regular rate and rhythm, no murmur, rubs, or gallops,  PULM: Slightly diminished bilaterally but no added breath sounds auscultated no increased work of breathing, tolerating ventilator GI: soft, bowel sounds active in all 4 quadrants, non-tender, non-distended Extremities: warm/dry, generalized nonpitting edema  Skin: no rashes or lesions  Resolved Hospital Problem list     Assessment & Plan:  Bleeding tracheostomy -Patient presented from [facility] due to profusely bleeding tracheostomy.  Per family tracheostomy was placed at [place] 1 week prior to admission.  Daughter states patient has progressively developed forceful cough over the last 24 hours and feels as if this may be leading factor to bleeding P: ENT evaluated and assessed in ED and packed trach Minimize suctioning as able Leave packing in place, removal per ENT Trend hemoglobin hematocrit Transfuse per protocol Hold home anticoagulation   Obstructive sleep apnea with OHS  Chronic hypoxic respiratory failure now status post percutaneous tracheostomy with  vent dependence -Per care everywhere patient utilizes 4 L nasal cannula at baseline prior to recent hospitalization Concern for possible evolving ventilator associated pneumonia -Chest x-ray on admission revealed patchy opacities throughout right lung and left lung however this is in the  setting of bleeding tracheostomy P: Daily attempts at ventilator wean to allow for eventual transition to ATC Continue ventilator support with lung protective strategies  Wean PEEP and FiO2 for sats greater than 90%. Head of bed elevated 30 degrees. Plateau pressures less than 30 cm H20.  Follow intermittent chest x-ray and ABG.   SAT/SBT as tolerated, mentation preclude extubation  Ensure adequate pulmonary hygiene  Follow cultures  VAP bundle in place  PAD protocol Obtain respiratory culture Empiric antibiotics  History of diastolic congestive heart failure -Per care everywhere most recent echocardiogram January 2023 revealed EF of 55 to 60% with moderate aortic stenosis.  Chronic atrial fibrillation anticoagulated with Eliquis History of hypertension/hyperlipidemia Moderate aortic stenosis P: Presented from [facility] on low dose Dopamine drip, unsure duration or rational to use at this time. Now on norepinephrine, gentle rehydration.  Restart home midodrine.   Echocardiogram unchanged Hold home anticoagulation given bleeding as above Continue statin  Type 2 diabetes P: SSI CBG goal 140-180  Morbid obesity P: Resume tube feeds today Dietitian consultation Optimize nutrition   Best Practice (right click and "Reselect all SmartList Selections" daily)   Diet/type: NPO DVT prophylaxis: SCD GI prophylaxis: PPI Lines: N/A Foley:  Yes, and it is still needed Code Status:  full code Last date of multidisciplinary goals of care discussion: Patient and family updated at bedside  Kipp Brood, MD [facility] ICU Physician [place]  Pager: [phone] Or Epic Secure Chat After hours: [phone].  04/08/2022, 6:36 PM

## 2022-04-08 NOTE — Plan of Care (Signed)

## 2022-04-08 NOTE — Progress Notes (Signed)
PHARMACY - PHYSICIAN COMMUNICATION CRITICAL VALUE ALERT - BLOOD CULTURE IDENTIFICATION (BCID)  [name] is an 56 y.o. female who presented to [facility] on 04/07/2022 with a chief complaint of bleeding tracheostomy  Assessment:  Blood cultures show GPC in 1/4 bottles and BCID with strep species. Possible contaminant  Name of physician (or Provider) Contacted: Dr. Lynetta Mare  Current antibiotics: Cefepime for possible PNA  Changes to prescribed antibiotics recommended:  Patient is on recommended antibiotics - No changes needed  Results for orders placed or performed during the hospital encounter of 04/07/22  Blood Culture ID Panel (Reflexed) (Collected: 04/07/2022  9:11 PM)  Result Value Ref Range   Enterococcus faecalis NOT DETECTED NOT DETECTED   Enterococcus Faecium NOT DETECTED NOT DETECTED   Listeria monocytogenes NOT DETECTED NOT DETECTED   Staphylococcus species NOT DETECTED NOT DETECTED   Staphylococcus aureus (BCID) NOT DETECTED NOT DETECTED   Staphylococcus epidermidis NOT DETECTED NOT DETECTED   Staphylococcus lugdunensis NOT DETECTED NOT DETECTED   Streptococcus species DETECTED (A) NOT DETECTED   Streptococcus agalactiae NOT DETECTED NOT DETECTED   Streptococcus pneumoniae NOT DETECTED NOT DETECTED   Streptococcus pyogenes NOT DETECTED NOT DETECTED   A.calcoaceticus-baumannii NOT DETECTED NOT DETECTED   Bacteroides fragilis NOT DETECTED NOT DETECTED   Enterobacterales NOT DETECTED NOT DETECTED   Enterobacter cloacae complex NOT DETECTED NOT DETECTED   Escherichia coli NOT DETECTED NOT DETECTED   Klebsiella aerogenes NOT DETECTED NOT DETECTED   Klebsiella oxytoca NOT DETECTED NOT DETECTED   Klebsiella pneumoniae NOT DETECTED NOT DETECTED   Proteus species NOT DETECTED NOT DETECTED   Salmonella species NOT DETECTED NOT DETECTED   Serratia marcescens NOT DETECTED NOT DETECTED   Haemophilus influenzae NOT DETECTED NOT DETECTED   Neisseria meningitidis NOT  DETECTED NOT DETECTED   Pseudomonas aeruginosa NOT DETECTED NOT DETECTED   Stenotrophomonas maltophilia NOT DETECTED NOT DETECTED   Candida albicans NOT DETECTED NOT DETECTED   Candida auris NOT DETECTED NOT DETECTED   Candida glabrata NOT DETECTED NOT DETECTED   Candida krusei NOT DETECTED NOT DETECTED   Candida parapsilosis NOT DETECTED NOT DETECTED   Candida tropicalis NOT DETECTED NOT DETECTED   Cryptococcus neoformans/gattii NOT DETECTED NOT DETECTED    Hildred Laser, PharmD Clinical Pharmacist **Pharmacist phone directory can now be found on [email or web address] ([email or web address]).  Listed under [place].

## 2022-04-08 NOTE — Progress Notes (Signed)
[place] Progress Note Patient Name: Chelsea [name] DOB: 12-01-1965 MRN: [id number]   Date of Service  04/08/2022  HPI/Events of Note  Received request for cough medication.  Pt had bleeding from trache site from coughing.   eICU Interventions  Guiafenesin/codeine ordered.      Intervention Category Intermediate Interventions: Other:  Elsie Lincoln 04/08/2022, 9:04 PM

## 2022-04-08 NOTE — Progress Notes (Signed)
[place] Progress Note Patient Name: [name] DOB: [birth year] MRN: [id number]   Date of Service  04/08/2022  HPI/Events of Note  BP readings have been inconistent and hard to titrate pressors. Asking for an A line.   Has only had 52m of urinary output. Another bolus?   eICU Interventions  Ordered a total of 1 liter LR bolus Aline ordered     Intervention Category Major Interventions: Hypotension - evaluation and management Intermediate Interventions: Oliguria - evaluation and management  EJudd Lien7/06/2022, 3:37 AM

## 2022-04-08 NOTE — Procedures (Signed)
Arterial Catheter Insertion Procedure Note  Chelsea [name]  [id number]  1966-06-23  Date:04/08/22  Time:4:59 AM    Provider Performing: Dimple Nanas    Procedure: Insertion of Arterial Line [phone]) without US guidance  Indication(s) Blood pressure monitoring and/or need for frequent ABGs  Consent Unable to obtain consent due to emergent nature of procedure.  Anesthesia None   Time Out Verified patient identification, verified procedure, site/side was marked, verified correct patient position, special equipment/implants available, medications/allergies/relevant history reviewed, required imaging and test results available.   Sterile Technique Maximal sterile technique including full sterile barrier drape, hand hygiene, sterile gown, sterile gloves, mask, hair covering, sterile ultrasound probe cover (if used).   Procedure Description Area of catheter insertion was cleaned with chlorhexidine and draped in sterile fashion. With real-time ultrasound guidance an arterial catheter was placed into the right radial artery.  Appropriate arterial tracings confirmed on monitor.     Complications/Tolerance None; patient tolerated the procedure well.   EBL Minimal   Specimen(s) None

## 2022-04-08 NOTE — Progress Notes (Signed)
Subjective: Resting on ventilator.  No active bleeding.  Objective: Vital signs in last 24 hours: Temp:  [98.7 F (37.1 C)-100.5 F (38.1 C)] 99.1 F (37.3 C) (07/09 0759) Pulse Rate:  [69-167] 74 (07/09 0730) Resp:  [13-30] 18 (07/09 0730) BP: (62-149)/(38-137) 80/44 (07/09 0501) SpO2:  [78 %-100 %] 98 % (07/09 0730) Arterial Line BP: (97-122)/(46-57) 122/57 (07/09 0730) FiO2 (%):  [80 %-100 %] 80 % (07/09 0501) Weight:  [150.9 kg-163.3 kg] 150.9 kg (07/09 0500) Wt Readings from Last 1 Encounters:  04/08/22 (!) 150.9 kg    Intake/Output from previous day: 07/08 0701 - 07/09 0700 In: 3131.4 [I.V.:1046.6; Blood:160; IV Piggyback:1924.8] Out: 994 [Urine:394; Emesis/NG output:200] Intake/Output this shift: Total I/O In: 79.8 [I.V.:54.4; IV Piggyback:25.4] Out: -   General appearance: no distress and sleeping Neck: trach in good position and functioning on ventilator, only old blood around trach site or in suction cannister  Recent Labs    04/07/22 2214 04/08/22 0404 04/08/22 0502  WBC 41.1* 36.8*  --   HGB 9.0* 7.3* 8.5*  HCT 29.1* 24.7* 25.0*  PLT 514* 405*  --     Recent Labs    04/07/22 1415 04/07/22 1436 04/07/22 2141 04/08/22 0404 04/08/22 0502  NA 141 138  138   < > 139 138  K 4.4 4.3  4.3   < > 3.5 3.5  CL 88* 87*  --  90*  --   CO2 42*  --   --  38*  --   GLUCOSE 384* 388*  --  247*  --   BUN 29* 31*  --  47*  --   CREATININE 0.57 0.60  --  1.27*  --   CALCIUM 8.5*  --   --  8.3*  --    < > = values in this interval not displayed.    Medications: I have reviewed the patient's current medications.  Assessment/Plan: Tracheostomy status, tracheostomy bleeding, anti-coagulation  Bleeding appears to be controlled with Vaseline gauze packing and reversing anti-coagulation.  Plan to leave two strips of packing in place until Tuesday or Wednesday.   LOS: 1 day   Chelsea [name] 04/08/2022, 8:33 AM

## 2022-04-08 NOTE — Progress Notes (Signed)
[facility] Progress Note Patient Name: Chelsea [name] DOB: October 07, 1965 MRN: [id number]   Date of Service  04/08/2022  HPI/Events of Note  K 3.5. CVC no NGT   Unable to use electrolyte protocol because her creatinine doubled.   Blood gas results are in 7.36/74/72  eICU Interventions  Ordered potassium 10 meqs x 2 doses     Intervention Category Intermediate Interventions: Electrolyte abnormality - evaluation and management  Judd Lien 04/08/2022, 5:40 AM

## 2022-04-08 NOTE — Progress Notes (Signed)
NAME:  CHANNEL PAPANDREA, MRN:  [id number], DOB:  02-24-1966, LOS: 1 ADMISSION DATE:  04/07/2022, CONSULTATION DATE:  04/07/2022 REFERRING MD:  Dr. Francia Greaves, CHIEF COMPLAINT:  Bleeding trach    History of Present Illness:  Chelsea [name] is a 56 y.o. female who resides at [facility] with history of CHF with CAD, COPD, OSA, atrial fibrillation HLD, HTN, diabetes, and chronic back pain who presented to the ED from LT[facility] due to bleeding tracheostomy.  Per family tracheostomy was performed last week at [place].  Daughter reports patient has had significant forceful cough that began overnight and into today which she believes has led to trach bleeding.  Per family patient has not progressed to trach collar trials yet at [facility].  On ED arrival patient was seen with mild tachycardia and eventual development of mild hypotension but remained alert and oriented.  Lab work on admission significant for chloride 88, glucose 384, BUN 29, albumin 2.8, AST 10, hemoglobin 8.0, INR 1.7.  ENT was consulted as well as PCCM for management of profusely bleeding tracheostomy.  ENT was able to pack the trach in the emergency department with control and bleeding.  And will admit and monitor in ICU for continued need of ventilator support  Pertinent  Medical History   CHF with CAD, COPD, atrial fibrillation OSA, HLD, HTN, diabetes, and chronic back pain  Significant Hospital Events: Including procedures, antibiotic start and stop dates in addition to other pertinent events   7/8 presented from [facility] with profusely bleeding tracheostomya 7/10 OR for ligation of bleeding vessel  Interim History / Subjective:  Sedated and NMB when seen.  Objective   Blood pressure (!) 80/44, pulse 97, temperature 99 F (37.2 C), temperature source Oral, resp. rate (!) 21, height '5\' 5"'$  (1.651 m), weight (!) 150.9 kg, SpO2 (!) 89 %.    Vent Mode: PCV FiO2 (%):  [60 %-100 %] 60 % Set Rate:  [12 bmp] 12 bmp PEEP:  [5 cmH20-8  cmH20] 8 cmH20 Plateau Pressure:  [25 FTD32-20 cmH20] 27 cmH20   Intake/Output Summary (Last 24 hours) at 04/08/2022 1503 Last data filed at 04/08/2022 1500 Gross per 24 hour  Intake 3851.76 ml  Output 2044 ml  Net 1807.76 ml   Filed Weights   04/07/22 1403 04/08/22 0500  Weight: (!) 163.3 kg (!) 150.9 kg    Examination: General: Acute on chronic ill-appearing middle-aged morbidly obese female lying in bed on mechanical ventilation through chronic tracheostomy in no acute distress HEENT: 7 cuffed trach midline, MM pink/moist, PERRL, wide stoma with some bleeding. + hirsutism.  Neuro: Sedated and paralyzed.  CV: s1s2 regular rate and rhythm, no murmur, rubs, or gallops,  PULM: Slightly diminished bilaterally but no added breath sounds auscultated no increased work of breathing, tolerating ventilator. High peak pressures GI: soft, bowel sounds active in all 4 quadrants, non-tender, non-distended Extremities: warm/dry, generalized nonpitting edema  Skin: no rashes or lesions  Resolved Hospital Problem list     Assessment & Plan:  Bleeding tracheostomy Obstructive sleep apnea with OHS  Chronic hypoxic respiratory failure now status post percutaneous tracheostomy with vent dependence Concern for possible evolving ventilator associated pneumonia History of diastolic congestive heart failure Chronic atrial fibrillation anticoagulated with Eliquis History of hypertension/hyperlipidemia Moderate aortic stenosis Type 2 diabetes Morbid obesity  Plan:   -Bleeding has stopped. Consider packing if bleeds further. Received TXA - Wean pressors to keep MAP >65. - Added tube feed insulin coverage - Hold all anticoagulation - Cortrak and  start feeding.  - Vt 19m/kg 456 - decrease PC and target driving pressure 15   Best Practice (right click and "Reselect all SmartList Selections" daily)   Diet/type: NPO start tube feeds. DVT prophylaxis: SCD GI prophylaxis: PPI Lines: N/A Foley:   Yes, and it is still needed Code Status:  full code Last date of multidisciplinary goals of care discussion: Patient and family updated at bedside  CRITICAL CARE Performed by: RKipp Brood  Total critical care time: 423mutes  Critical care time was exclusive of separately billable procedures and treating other patients.  Critical care was necessary to treat or prevent imminent or life-threatening deterioration.  Critical care was time spent personally by me on the following activities: development of treatment plan with patient and/or surrogate as well as nursing, discussions with consultants, evaluation of patient's response to treatment, examination of patient, obtaining history from patient or surrogate, ordering and performing treatments and interventions, ordering and review of laboratory studies, ordering and review of radiographic studies, pulse oximetry, re-evaluation of patient's condition and participation in multidisciplinary rounds.  RaKipp BroodMD FRJohn Muir Behavioral Health CenterCU Physician CHSan Luis ObispoPager: [phone]: [phone] hours: [phone].   04/08/2022, 3:03 PM

## 2022-04-08 NOTE — Plan of Care (Signed)
Problem: Education: Goal: Ability to describe self-care measures that may prevent or decrease complications (Diabetes Survival Skills Education) will improve 04/08/2022 0245 by Driscilla Grammes, Ardeen Fillers, RN Outcome: Progressing 04/08/2022 0244 by Tillie Fantasia, RN Outcome: Progressing Goal: Individualized Educational Video(s) 04/08/2022 0245 by Tillie Fantasia, RN Outcome: Progressing 04/08/2022 0244 by Tillie Fantasia, RN Outcome: Progressing   Problem: Coping: Goal: Ability to adjust to condition or change in health will improve 04/08/2022 0245 by Tillie Fantasia, RN Outcome: Progressing 04/08/2022 0244 by Tillie Fantasia, RN Outcome: Progressing   Problem: Fluid Volume: Goal: Ability to maintain a balanced intake and output will improve 04/08/2022 0245 by Tillie Fantasia, RN Outcome: Progressing 04/08/2022 0244 by Tillie Fantasia, RN Outcome: Progressing   Problem: Health Behavior/Discharge Planning: Goal: Ability to identify and utilize available resources and services will improve 04/08/2022 0245 by Tillie Fantasia, RN Outcome: Progressing 04/08/2022 0244 by Tillie Fantasia, RN Outcome: Progressing Goal: Ability to manage health-related needs will improve 04/08/2022 0245 by Tillie Fantasia, RN Outcome: Progressing 04/08/2022 0244 by Tillie Fantasia, RN Outcome: Progressing   Problem: Metabolic: Goal: Ability to maintain appropriate glucose levels will improve 04/08/2022 0245 by Tillie Fantasia, RN Outcome: Progressing 04/08/2022 0244 by Tillie Fantasia, RN Outcome: Progressing   Problem: Nutritional: Goal: Maintenance of adequate nutrition will improve 04/08/2022 0245 by Tillie Fantasia, RN Outcome: Progressing 04/08/2022 0244 by Tillie Fantasia, RN Outcome: Progressing Goal: Progress toward achieving an optimal weight will  improve 04/08/2022 0245 by Tillie Fantasia, RN Outcome: Progressing 04/08/2022 0244 by Tillie Fantasia, RN Outcome: Progressing   Problem: Skin Integrity: Goal: Risk for impaired skin integrity will decrease 04/08/2022 0245 by Tillie Fantasia, RN Outcome: Progressing 04/08/2022 0244 by Tillie Fantasia, RN Outcome: Progressing   Problem: Tissue Perfusion: Goal: Adequacy of tissue perfusion will improve 04/08/2022 0245 by Tillie Fantasia, RN Outcome: Progressing 04/08/2022 0244 by Tillie Fantasia, RN Outcome: Progressing   Problem: Education: Goal: Knowledge of General Education information will improve Description: Including pain rating scale, medication(s)/side effects and non-pharmacologic comfort measures 04/08/2022 0245 by Tillie Fantasia, RN Outcome: Progressing 04/08/2022 0244 by Tillie Fantasia, RN Outcome: Progressing   Problem: Health Behavior/Discharge Planning: Goal: Ability to manage health-related needs will improve 04/08/2022 0245 by Tillie Fantasia, RN Outcome: Progressing 04/08/2022 0244 by Tillie Fantasia, RN Outcome: Progressing   Problem: Clinical Measurements: Goal: Ability to maintain clinical measurements within normal limits will improve 04/08/2022 0245 by Tillie Fantasia, RN Outcome: Progressing 04/08/2022 0244 by Tillie Fantasia, RN Outcome: Progressing Goal: Will remain free from infection 04/08/2022 0245 by Tillie Fantasia, RN Outcome: Progressing 04/08/2022 0244 by Tillie Fantasia, RN Outcome: Progressing Goal: Diagnostic test results will improve 04/08/2022 0245 by Tillie Fantasia, RN Outcome: Progressing 04/08/2022 0244 by Tillie Fantasia, RN Outcome: Progressing Goal: Respiratory complications will improve 04/08/2022 0245 by Tillie Fantasia, RN Outcome: Progressing 04/08/2022 0244 by  Tillie Fantasia, RN Outcome: Progressing Goal: Cardiovascular complication will be avoided 04/08/2022 0245 by Tillie Fantasia, RN Outcome: Progressing 04/08/2022 0244 by Tillie Fantasia, RN Outcome: Progressing   Problem: Activity: Goal: Risk for activity intolerance will decrease 04/08/2022 0245 by Driscilla Grammes, Ardeen Fillers, RN Outcome: Progressing 04/08/2022  15 by Driscilla Grammes, Ardeen Fillers, RN Outcome: Progressing   Problem: Nutrition: Goal: Adequate nutrition will be maintained 04/08/2022 0245 by Tillie Fantasia, RN Outcome: Progressing 04/08/2022 0244 by Tillie Fantasia, RN Outcome: Progressing   Problem: Coping: Goal: Level of anxiety will decrease 04/08/2022 0245 by Tillie Fantasia, RN Outcome: Progressing 04/08/2022 0244 by Tillie Fantasia, RN Outcome: Progressing   Problem: Elimination: Goal: Will not experience complications related to bowel motility 04/08/2022 0245 by Tillie Fantasia, RN Outcome: Progressing 04/08/2022 0244 by Tillie Fantasia, RN Outcome: Progressing Goal: Will not experience complications related to urinary retention 04/08/2022 0245 by Tillie Fantasia, RN Outcome: Progressing 04/08/2022 0244 by Tillie Fantasia, RN Outcome: Progressing   Problem: Pain Managment: Goal: General experience of comfort will improve 04/08/2022 0245 by Tillie Fantasia, RN Outcome: Progressing 04/08/2022 0244 by Tillie Fantasia, RN Outcome: Progressing   Problem: Safety: Goal: Ability to remain free from injury will improve 04/08/2022 0245 by Tillie Fantasia, RN Outcome: Progressing 04/08/2022 0244 by Tillie Fantasia, RN Outcome: Progressing   Problem: Skin Integrity: Goal: Risk for impaired skin integrity will decrease 04/08/2022 0245 by Tillie Fantasia, RN Outcome: Progressing 04/08/2022 0244 by Tillie Fantasia, RN Outcome: Progressing   Problem: Education: Goal: Knowledge of General Education information will improve Description: Including pain rating scale, medication(s)/side effects and non-pharmacologic comfort measures Outcome: Progressing   Problem: Health Behavior/Discharge Planning: Goal: Ability to manage health-related needs will improve Outcome: Progressing   Problem: Clinical Measurements: Goal: Ability to maintain clinical measurements within normal limits will improve Outcome: Progressing Goal: Will remain free from infection Outcome: Progressing Goal: Diagnostic test results will improve Outcome: Progressing Goal: Respiratory complications will improve Outcome: Progressing Goal: Cardiovascular complication will be avoided Outcome: Progressing   Problem: Activity: Goal: Risk for activity intolerance will decrease Outcome: Progressing   Problem: Nutrition: Goal: Adequate nutrition will be maintained Outcome: Progressing   Problem: Coping: Goal: Level of anxiety will decrease Outcome: Progressing   Problem: Elimination: Goal: Will not experience complications related to bowel motility Outcome: Progressing Goal: Will not experience complications related to urinary retention Outcome: Progressing   Problem: Pain Managment: Goal: General experience of comfort will improve Outcome: Progressing   Problem: Safety: Goal: Ability to remain free from injury will improve Outcome: Progressing   Problem: Skin Integrity: Goal: Risk for impaired skin integrity will decrease Outcome: Progressing

## 2022-04-08 NOTE — Progress Notes (Signed)
2030 Notified elink Dr. [name] Bi of low hemoglobin 7.0 and pt is actively bleeding from her trach stoma. New orders for blood products. Also increasing levophed   2100 ekg completed to evaluate any ischemic events and origin of tacycardia. She is Sinus tach.   2112 notified Elink that pt is tachycardic 130's  and hypotensive. Recently started dopamine as ordered which may be contributing to tachycardia. Spo2 trending down and maxed to 100% Fio2 on vent. Going up on levophed. Pt has a fever. New orders for pan culture and antibiotics to be started after blood cultures obtained.   2130 ground team on the way to evaluate pt at bedside.   2141 blood gas obtained   2146 dopamine stopped.   2150 Dr Redmond Baseman paged to come in and assist with bleed from trach stoma  2200 after dopamine stopped, tachycardia and low oxygen saturation improving   2215 Ivf bolus and blood transfusion complete, bp improving   2300 Dr. Redmond Baseman to bedside and additional packing placed to resolve bleeding.   2330 foley cath replaced due to current foley from nursing home and family reports recent uti treatment. Will send ua once enough urine is available.

## 2022-04-08 NOTE — Progress Notes (Signed)
  Echocardiogram 2D Echocardiogram has been performed.  Chelsea [name] F 04/08/2022, 10:37 AM

## 2022-04-09 ENCOUNTER — Inpatient Hospital Stay (HOSPITAL_COMMUNITY): Payer: Medicare Other | Admitting: Registered Nurse

## 2022-04-09 ENCOUNTER — Encounter (HOSPITAL_COMMUNITY)
Admission: EM | Disposition: A | Discharge status: Nursing Home (Includes ICF) | Payer: Self-pay | Source: Skilled Nursing Facility | Attending: Critical Care Medicine

## 2022-04-09 ENCOUNTER — Inpatient Hospital Stay (HOSPITAL_COMMUNITY): Payer: Medicare Other

## 2022-04-09 DIAGNOSIS — J9501 Hemorrhage from tracheostomy stoma: Secondary | ICD-10-CM

## 2022-04-09 DIAGNOSIS — I2510 Atherosclerotic heart disease of native coronary artery without angina pectoris: Secondary | ICD-10-CM

## 2022-04-09 DIAGNOSIS — I110 Hypertensive heart disease with heart failure: Secondary | ICD-10-CM | POA: Diagnosis not present

## 2022-04-09 DIAGNOSIS — I509 Heart failure, unspecified: Secondary | ICD-10-CM

## 2022-04-09 DIAGNOSIS — R049 Hemorrhage from respiratory passages, unspecified: Secondary | ICD-10-CM | POA: Diagnosis not present

## 2022-04-09 HISTORY — PX: TRACHEOSTOMY REVISION: SHX6133

## 2022-04-09 LAB — PROTIME-INR
INR: 1.6 — ABNORMAL HIGH (ref 0.8–1.2)
Prothrombin Time: 19 seconds — ABNORMAL HIGH (ref 11.4–15.2)

## 2022-04-09 LAB — POCT I-STAT 7, (LYTES, BLD GAS, ICA,H+H)
Acid-Base Excess: 12 mmol/L — ABNORMAL HIGH (ref 0.0–2.0)
Acid-Base Excess: 2 mmol/L (ref 0.0–2.0)
Bicarbonate: 37.7 mmol/L — ABNORMAL HIGH (ref 20.0–28.0)
Bicarbonate: 27.6 mmol/L (ref 20.0–28.0)
Calcium, Ion: 1.17 mmol/L (ref 1.15–1.40)
Calcium, Ion: 1.04 mmol/L — ABNORMAL LOW (ref 1.15–1.40)
HCT: 25 % — ABNORMAL LOW (ref 36.0–46.0)
HCT: 19 % — ABNORMAL LOW (ref 36.0–46.0)
Hemoglobin: 8.5 g/dL — ABNORMAL LOW (ref 12.0–15.0)
Hemoglobin: 6.5 g/dL — CL (ref 12.0–15.0)
O2 Saturation: 98 %
O2 Saturation: 98 %
Patient temperature: 100.4
Patient temperature: 99.9
Potassium: 3.7 mmol/L (ref 3.5–5.1)
Potassium: 2.6 mmol/L — CL (ref 3.5–5.1)
Sodium: 138 mmol/L (ref 135–145)
Sodium: 146 mmol/L — ABNORMAL HIGH (ref 135–145)
TCO2: 39 mmol/L — ABNORMAL HIGH (ref 22–32)
TCO2: 29 mmol/L (ref 22–32)
pCO2 arterial: 58.8 mmHg — ABNORMAL HIGH (ref 32–48)
pCO2 arterial: 46.6 mmHg (ref 32–48)
pH, Arterial: 7.419 (ref 7.35–7.45)
pH, Arterial: 7.384 (ref 7.35–7.45)
pO2, Arterial: 121 mmHg — ABNORMAL HIGH (ref 83–108)
pO2, Arterial: 106 mmHg (ref 83–108)

## 2022-04-09 LAB — BASIC METABOLIC PANEL
Anion gap: 10 (ref 5–15)
Anion gap: 7 (ref 5–15)
BUN: 32 mg/dL — ABNORMAL HIGH (ref 6–20)
BUN: 23 mg/dL — ABNORMAL HIGH (ref 6–20)
CO2: 35 mmol/L — ABNORMAL HIGH (ref 22–32)
CO2: 34 mmol/L — ABNORMAL HIGH (ref 22–32)
Calcium: 8.5 mg/dL — ABNORMAL LOW (ref 8.9–10.3)
Calcium: 8.2 mg/dL — ABNORMAL LOW (ref 8.9–10.3)
Chloride: 93 mmol/L — ABNORMAL LOW (ref 98–111)
Chloride: 98 mmol/L (ref 98–111)
Creatinine, Ser: 0.74 mg/dL (ref 0.44–1.00)
Creatinine, Ser: 0.56 mg/dL (ref 0.44–1.00)
GFR, Estimated: >60 mL/min (ref >60)
GFR, Estimated: >60 mL/min (ref >60)
Glucose, Bld: 342 mg/dL — ABNORMAL HIGH (ref 70–99)
Glucose, Bld: 238 mg/dL — ABNORMAL HIGH (ref 70–99)
Potassium: 3.3 mmol/L — ABNORMAL LOW (ref 3.5–5.1)
Potassium: 3.4 mmol/L — ABNORMAL LOW (ref 3.5–5.1)
Sodium: 138 mmol/L (ref 135–145)
Sodium: 139 mmol/L (ref 135–145)

## 2022-04-09 LAB — PHOSPHORUS
Phosphorus: 2.7 mg/dL (ref 2.5–4.6)
Phosphorus: 1.4 mg/dL — ABNORMAL LOW (ref 2.5–4.6)

## 2022-04-09 LAB — MAGNESIUM
Magnesium: 2.1 mg/dL (ref 1.7–2.4)
Magnesium: 1.9 mg/dL (ref 1.7–2.4)

## 2022-04-09 LAB — CBC
HCT: 21.5 % — ABNORMAL LOW (ref 36.0–46.0)
Hemoglobin: 6.4 g/dL — CL (ref 12.0–15.0)
MCH: 26.3 pg (ref 26.0–34.0)
MCHC: 29.8 g/dL — ABNORMAL LOW (ref 30.0–36.0)
MCV: 88.5 fL (ref 80.0–100.0)
Platelets: 315 10*3/uL (ref 150–400)
RBC: 2.43 MIL/uL — ABNORMAL LOW (ref 3.87–5.11)
RDW: 15.6 % — ABNORMAL HIGH (ref 11.5–15.5)
WBC: 20 10*3/uL — ABNORMAL HIGH (ref 4.0–10.5)
nRBC: 0 % (ref 0.0–0.2)

## 2022-04-09 LAB — GLUCOSE, CAPILLARY
Glucose-Capillary: 353 mg/dL — ABNORMAL HIGH (ref 70–99)
Glucose-Capillary: 276 mg/dL — ABNORMAL HIGH (ref 70–99)
Glucose-Capillary: 271 mg/dL — ABNORMAL HIGH (ref 70–99)
Glucose-Capillary: 262 mg/dL — ABNORMAL HIGH (ref 70–99)
Glucose-Capillary: 228 mg/dL — ABNORMAL HIGH (ref 70–99)
Glucose-Capillary: 211 mg/dL — ABNORMAL HIGH (ref 70–99)

## 2022-04-09 LAB — HEMOGLOBIN AND HEMATOCRIT, BLOOD
HCT: 25.6 % — ABNORMAL LOW (ref 36.0–46.0)
HCT: 23.1 % — ABNORMAL LOW (ref 36.0–46.0)
Hemoglobin: 7.9 g/dL — ABNORMAL LOW (ref 12.0–15.0)
Hemoglobin: 7.2 g/dL — ABNORMAL LOW (ref 12.0–15.0)

## 2022-04-09 LAB — PREPARE RBC (CROSSMATCH)

## 2022-04-09 SURGERY — REVISION, STOMA, TRACHEA
Anesthesia: General | Site: Neck

## 2022-04-09 MED ORDER — PROSOURCE TF PO LIQD
45.0000 mL | Freq: Every day | ORAL | Status: DC
  Administered 2022-04-10 – 2022-04-22 (×13): 45 mL
  Filled 2022-04-09 (×13): qty 45

## 2022-04-09 MED ORDER — FENTANYL BOLUS VIA INFUSION
50.0000 ug | INTRAVENOUS | Status: DC | PRN
  Administered 2022-04-10 – 2022-04-11 (×3): 100 ug via INTRAVENOUS

## 2022-04-09 MED ORDER — FENTANYL CITRATE (PF) 250 MCG/5ML IJ SOLN
INTRAMUSCULAR | Status: AC
  Filled 2022-04-09: qty 5

## 2022-04-09 MED ORDER — LACTATED RINGERS IV SOLN: INTRAVENOUS | Status: DC | PRN

## 2022-04-09 MED ORDER — ALTEPLASE 2 MG IJ SOLR
2.0000 mg | Freq: Once | INTRAMUSCULAR | Status: AC
  Administered 2022-04-09: 2 mg
  Filled 2022-04-09: qty 2

## 2022-04-09 MED ORDER — SODIUM CHLORIDE 0.9% IV SOLUTION: Freq: Once | INTRAVENOUS | Status: DC

## 2022-04-09 MED ORDER — THROMBIN 5000 UNITS EX SOLR
5000.0000 [IU] | CUTANEOUS | Status: AC
  Filled 2022-04-09: qty 5000

## 2022-04-09 MED ORDER — LIDOCAINE-EPINEPHRINE 1 %-1:100000 IJ SOLN
INTRAMUSCULAR | Status: DC | PRN
  Administered 2022-04-09: 30 mL

## 2022-04-09 MED ORDER — ROCURONIUM BROMIDE 10 MG/ML (PF) SYRINGE
PREFILLED_SYRINGE | INTRAVENOUS | Status: DC | PRN
  Administered 2022-04-09 (×3): 50 mg via INTRAVENOUS

## 2022-04-09 MED ORDER — VITAL HIGH PROTEIN PO LIQD
1000.0000 mL | ORAL | Status: DC
  Administered 2022-04-10 – 2022-04-18 (×17): 1000 mL

## 2022-04-09 MED ORDER — EPINEPHRINE 1 MG/10ML IJ SOSY
PREFILLED_SYRINGE | INTRAMUSCULAR | Status: DC | PRN
  Administered 2022-04-09 (×4): 10 ug via INTRAVENOUS

## 2022-04-09 MED ORDER — MIDAZOLAM HCL 2 MG/2ML IJ SOLN
INTRAMUSCULAR | Status: AC
  Filled 2022-04-09: qty 2

## 2022-04-09 MED ORDER — FENTANYL CITRATE PF 50 MCG/ML IJ SOSY: 50.0000 ug | PREFILLED_SYRINGE | Freq: Once | INTRAMUSCULAR | Status: DC

## 2022-04-09 MED ORDER — FENTANYL 2500MCG IN NS 250ML (10MCG/ML) PREMIX INFUSION
50.0000 ug/h | INTRAVENOUS | Status: DC
  Administered 2022-04-09: 50 ug/h via INTRAVENOUS
  Administered 2022-04-10: 100 ug/h via INTRAVENOUS
  Administered 2022-04-10: 125 ug/h via INTRAVENOUS
  Filled 2022-04-09 (×3): qty 250

## 2022-04-09 MED ORDER — VECURONIUM BROMIDE 10 MG IV SOLR: 10.0000 mg | INTRAVENOUS | Status: DC | PRN

## 2022-04-09 MED ORDER — DIPHENHYDRAMINE HCL 25 MG PO CAPS: 50.0000 mg | ORAL_CAPSULE | Freq: Once | ORAL | Status: DC

## 2022-04-09 MED ORDER — INSULIN GLARGINE-YFGN 100 UNIT/ML ~~LOC~~ SOLN
20.0000 [IU] | Freq: Every day | SUBCUTANEOUS | Status: DC
Start: 2022-04-09 — End: 2022-04-22
  Administered 2022-04-09 – 2022-04-22 (×14): 20 [IU] via SUBCUTANEOUS
  Filled 2022-04-09 (×14): qty 0.2

## 2022-04-09 MED ORDER — POTASSIUM CHLORIDE 20 MEQ PO PACK
40.0000 meq | PACK | Freq: Once | ORAL | Status: AC
  Administered 2022-04-10: 40 meq
  Filled 2022-04-09: qty 2

## 2022-04-09 MED ORDER — VITAL HIGH PROTEIN PO LIQD: 1000.0000 mL | ORAL | Status: DC

## 2022-04-09 MED ORDER — PREDNISONE 20 MG PO TABS: 50.0000 mg | ORAL_TABLET | Freq: Four times a day (QID) | ORAL | Status: DC

## 2022-04-09 MED ORDER — PROPOFOL 10 MG/ML IV BOLUS
INTRAVENOUS | Status: AC
  Filled 2022-04-09: qty 20

## 2022-04-09 MED ORDER — INSULIN ASPART 100 UNIT/ML IJ SOLN
0.0000 [IU] | INTRAMUSCULAR | Status: DC
  Administered 2022-04-09: 7 [IU] via SUBCUTANEOUS
  Administered 2022-04-09 – 2022-04-10 (×5): 11 [IU] via SUBCUTANEOUS
  Administered 2022-04-10: 7 [IU] via SUBCUTANEOUS
  Administered 2022-04-10: 4 [IU] via SUBCUTANEOUS
  Administered 2022-04-10: 7 [IU] via SUBCUTANEOUS
  Administered 2022-04-11 (×3): 4 [IU] via SUBCUTANEOUS
  Administered 2022-04-11: 3 [IU] via SUBCUTANEOUS
  Administered 2022-04-11 – 2022-04-12 (×3): 4 [IU] via SUBCUTANEOUS
  Administered 2022-04-12 (×2): 3 [IU] via SUBCUTANEOUS
  Administered 2022-04-12: 4 [IU] via SUBCUTANEOUS
  Administered 2022-04-12: 3 [IU] via SUBCUTANEOUS
  Administered 2022-04-12: 4 [IU] via SUBCUTANEOUS
  Administered 2022-04-12 – 2022-04-13 (×4): 3 [IU] via SUBCUTANEOUS
  Administered 2022-04-13: 4 [IU] via SUBCUTANEOUS
  Administered 2022-04-14 – 2022-04-16 (×6): 3 [IU] via SUBCUTANEOUS
  Administered 2022-04-16: 4 [IU] via SUBCUTANEOUS
  Administered 2022-04-16 – 2022-04-17 (×3): 3 [IU] via SUBCUTANEOUS
  Administered 2022-04-17 (×2): 4 [IU] via SUBCUTANEOUS
  Administered 2022-04-17 – 2022-04-18 (×3): 3 [IU] via SUBCUTANEOUS
  Administered 2022-04-18: 4 [IU] via SUBCUTANEOUS
  Administered 2022-04-18 (×3): 3 [IU] via SUBCUTANEOUS
  Administered 2022-04-19: 4 [IU] via SUBCUTANEOUS
  Administered 2022-04-19 (×2): 3 [IU] via SUBCUTANEOUS
  Administered 2022-04-19 (×2): 4 [IU] via SUBCUTANEOUS
  Administered 2022-04-19: 3 [IU] via SUBCUTANEOUS
  Administered 2022-04-20: 4 [IU] via SUBCUTANEOUS
  Administered 2022-04-20: 3 [IU] via SUBCUTANEOUS
  Administered 2022-04-20: 2 [IU] via SUBCUTANEOUS
  Administered 2022-04-20: 4 [IU] via SUBCUTANEOUS
  Administered 2022-04-20: 7 [IU] via SUBCUTANEOUS
  Administered 2022-04-20: 4 [IU] via SUBCUTANEOUS
  Administered 2022-04-21 (×2): 3 [IU] via SUBCUTANEOUS
  Administered 2022-04-21 (×2): 4 [IU] via SUBCUTANEOUS
  Administered 2022-04-21 (×2): 7 [IU] via SUBCUTANEOUS
  Administered 2022-04-22: 3 [IU] via SUBCUTANEOUS
  Administered 2022-04-22 (×4): 4 [IU] via SUBCUTANEOUS

## 2022-04-09 MED ORDER — PROPOFOL 10 MG/ML IV BOLUS
INTRAVENOUS | Status: DC | PRN
  Administered 2022-04-09 (×2): 50 mg via INTRAVENOUS

## 2022-04-09 MED ORDER — PROPOFOL 1000 MG/100ML IV EMUL
5.0000 ug/kg/min | INTRAVENOUS | Status: DC
  Administered 2022-04-09: 10 ug/kg/min via INTRAVENOUS
  Administered 2022-04-09 (×2): 20 ug/kg/min via INTRAVENOUS
  Administered 2022-04-10: 40 ug/kg/min via INTRAVENOUS
  Administered 2022-04-10 (×2): 20 ug/kg/min via INTRAVENOUS
  Filled 2022-04-09 (×6): qty 100

## 2022-04-09 MED ORDER — LIDOCAINE-EPINEPHRINE 0.5 %-1:200000 IJ SOLN
30.0000 mL | Freq: Once | INTRAMUSCULAR | Status: DC
  Filled 2022-04-09: qty 30

## 2022-04-09 MED ORDER — TRANEXAMIC ACID-NACL 1000-0.7 MG/100ML-% IV SOLN
1000.0000 mg | INTRAVENOUS | Status: AC
  Administered 2022-04-09: 1000 mg via INTRAVENOUS
  Filled 2022-04-09: qty 100

## 2022-04-09 MED ORDER — INSULIN ASPART 100 UNIT/ML IJ SOLN
6.0000 [IU] | INTRAMUSCULAR | Status: DC
  Administered 2022-04-09 – 2022-04-10 (×4): 6 [IU] via SUBCUTANEOUS

## 2022-04-09 MED ORDER — SODIUM CHLORIDE 0.9 % IV SOLN: INTRAVENOUS | Status: DC | PRN

## 2022-04-09 MED ORDER — POTASSIUM CHLORIDE 20 MEQ PO PACK
20.0000 meq | PACK | ORAL | Status: AC
  Administered 2022-04-09 (×2): 20 meq
  Filled 2022-04-09 (×2): qty 1

## 2022-04-09 MED ORDER — LIDOCAINE-EPINEPHRINE 1 %-1:100000 IJ SOLN
INTRAMUSCULAR | Status: AC
  Filled 2022-04-09: qty 1

## 2022-04-09 MED ORDER — POTASSIUM CHLORIDE 10 MEQ/50ML IV SOLN
10.0000 meq | INTRAVENOUS | Status: AC
  Administered 2022-04-09 (×3): 10 meq via INTRAVENOUS
  Filled 2022-04-09 (×3): qty 50

## 2022-04-09 MED ORDER — LIDOCAINE-EPINEPHRINE (PF) 2 %-1:200000 IJ SOLN
INTRAMUSCULAR | Status: AC
  Filled 2022-04-09: qty 20

## 2022-04-09 MED ORDER — MIDAZOLAM HCL 2 MG/2ML IJ SOLN
INTRAMUSCULAR | Status: DC | PRN
  Administered 2022-04-09: 2 mg via INTRAVENOUS

## 2022-04-09 MED ORDER — 0.9 % SODIUM CHLORIDE (POUR BTL) OPTIME
TOPICAL | Status: DC | PRN
  Administered 2022-04-09: 1000 mL

## 2022-04-09 MED ORDER — ALBUTEROL SULFATE HFA 108 (90 BASE) MCG/ACT IN AERS
INHALATION_SPRAY | RESPIRATORY_TRACT | Status: DC | PRN
  Administered 2022-04-09 (×2): 2 via RESPIRATORY_TRACT

## 2022-04-09 SURGICAL SUPPLY — 44 items
BLADE SURG 15 STRL LF DISP TIS (BLADE) IMPLANT
BLADE SURG 15 STRL SS (BLADE)
CANISTER SUCT 3000ML PPV (MISCELLANEOUS) ×2 IMPLANT
CLEANER TIP ELECTROSURG 2X2 (MISCELLANEOUS) ×2 IMPLANT
CORD BIPOLAR FORCEPS 12FT (ELECTRODE) IMPLANT
COVER SURGICAL LIGHT HANDLE (MISCELLANEOUS) ×2 IMPLANT
DRAPE HALF SHEET 40X57 (DRAPES) IMPLANT
ELECT COATED BLADE 2.86 ST (ELECTRODE) ×2 IMPLANT
ELECT REM PT RETURN 9FT ADLT (ELECTROSURGICAL) ×2
ELECTRODE REM PT RTRN 9FT ADLT (ELECTROSURGICAL) ×1 IMPLANT
FORCEPS BIPOLAR SPETZLER 8 1.0 (NEUROSURGERY SUPPLIES) ×1 IMPLANT
GAUZE 4X4 16PLY ~~LOC~~+RFID DBL (SPONGE) ×2 IMPLANT
GAUZE PETROLATUM 1 X8 (GAUZE/BANDAGES/DRESSINGS) IMPLANT
GLOVE BIO SURGEON STRL SZ7.5 (GLOVE) ×2 IMPLANT
GOWN STRL REUS W/ TWL LRG LVL3 (GOWN DISPOSABLE) ×2 IMPLANT
GOWN STRL REUS W/TWL LRG LVL3 (GOWN DISPOSABLE) ×2
HOLDER TRACH TUBE VELCRO 19.5 (MISCELLANEOUS) ×2 IMPLANT
KIT BASIN OR (CUSTOM PROCEDURE TRAY) ×2 IMPLANT
KIT SUCTION CATH 14FR (SUCTIONS) IMPLANT
KIT TURNOVER KIT B (KITS) ×2 IMPLANT
MAT PREVALON FULL STRYKER (MISCELLANEOUS) ×1 IMPLANT
NDL HYPO 25GX1X1/2 BEV (NEEDLE) IMPLANT
NEEDLE HYPO 25GX1X1/2 BEV (NEEDLE) ×2 IMPLANT
NS IRRIG 1000ML POUR BTL (IV SOLUTION) ×2 IMPLANT
PAD ARMBOARD 7.5X6 YLW CONV (MISCELLANEOUS) ×4 IMPLANT
PENCIL BUTTON HOLSTER BLD 10FT (ELECTRODE) ×2 IMPLANT
SPONGE DRAIN TRACH 4X4 STRL 2S (GAUZE/BANDAGES/DRESSINGS) ×3 IMPLANT
SPONGE INTESTINAL PEANUT (DISPOSABLE) IMPLANT
SUT SILK 0 FSL (SUTURE) ×2 IMPLANT
SUT SILK 2 0 REEL (SUTURE) ×2 IMPLANT
SUT SILK 2 0 SH CR/8 (SUTURE) ×3 IMPLANT
SUT SILK 3 0 (SUTURE) ×1
SUT SILK 3-0 18XBRD TIE 12 (SUTURE) IMPLANT
SUT VIC AB 2-0 FS1 27 (SUTURE) IMPLANT
SYR 10ML LL (SYRINGE) ×1 IMPLANT
SYR 20ML ECCENTRIC (SYRINGE) ×2 IMPLANT
SYR BULB EAR ULCER 3OZ GRN STR (SYRINGE) ×2 IMPLANT
SYR CONTROL 10ML LL (SYRINGE) ×2 IMPLANT
TRAY ENT MC OR (CUSTOM PROCEDURE TRAY) ×2 IMPLANT
TUBE CONNECTING 12X1/4 (SUCTIONS) ×1 IMPLANT
TUBE TRACH 7 PROX EXL UNCUF (TUBING) IMPLANT
TUBE TRACH 7.0 EXL PROX CUF (TUBING) ×1 IMPLANT
WATER STERILE IRR 1000ML POUR (IV SOLUTION) ×2 IMPLANT
YANKAUER SUCT BULB TIP NO VENT (SUCTIONS) ×1 IMPLANT

## 2022-04-09 NOTE — Progress Notes (Signed)
This RN performing intentional rounding. Substantial bleeding noted from patient trach site. Hgb 6.4. Immediate communication sent to [facility]. CN notified. MD in the box notified ground team. PRBCs ready and transfused. ENT contacted. Patient taken to OR. Report given to Roselyn Reef, CRNA by this RN.

## 2022-04-09 NOTE — Progress Notes (Signed)
Pt transported from OR on vent with no complications noted. RT notified of pt's return.

## 2022-04-09 NOTE — Progress Notes (Signed)
Initial Nutrition Assessment  DOCUMENTATION CODES:   Morbid obesity  INTERVENTION:   Recommend considering insulin drip if unable to improve glucose management with current interventions  Tube Feeding via Cortrak:  Vital High Protein at 65 ml/hr Pro-Source TF 45 mL daily This provides 1600 kcals, 148 g of protein  Additional calories from fat via propofol infusion  NUTRITION DIAGNOSIS:   Inadequate oral intake related to acute illness as evidenced by NPO status.  GOAL:   Patient will meet greater than or equal to 90% of their needs  MONITOR:   Vent status, Labs, Weight trends, TF tolerance  REASON FOR ASSESSMENT:   Consult, Ventilator Enteral/tube feeding initiation and management  ASSESSMENT:   56 yo female admitted from [facility] with bleeding from around new trach, acute respiratory failure requiring vent support. PMH includes CHF EF 55-60%, CAD, COPD, HLD, HTN, diabetes, chronic back pain  7/08 Admitted from [place] with profuse bleeding via trach, packed 2x by ENT 7/10 ENT: OR to control trach hemorrhage  Per report, trach performed last week at [place]. Also presented on dopamine drip  Pt remains sedated on vent support via trach. Currently requiring levophed and vasopressin Propofol: 18.1 ml/hr  NG tube in place; placed at [facility] prior. Replaced with Cortrak today  Current weight 150.9 kg; mild edema.   Per family, pt has been bed bound for years. Pt was eating well prior to hospitalization in [facility] prior to transfer to [facility].   Labs: CBGs 200-353, Creatinine wdl Meds: ss novolog, semglee,    Diet Order:   Diet Order             Diet NPO time specified  Diet effective now                   EDUCATION NEEDS:   Not appropriate for education at this time  Skin:  Skin Assessment: Skin Integrity Issues: Skin Integrity Issues:: Incisions, Other (Comment) Incisions: new trach-ENT evaluated for hemorrhage Other: fluid filled blister  along skin fold-mid sacrum  Last BM:  7/10  Height:   Ht Readings from Last 1 Encounters:  04/07/22 '5\' 5"'$  (1.651 m)    Weight:   Wt Readings from Last 1 Encounters:  04/08/22 (!) 150.9 kg     BMI:  Body mass index is 55.36 kg/m.  Estimated Nutritional Needs:   Kcal:  1500-1700 kcals  Protein:  125-145 g  Fluid:  >/= 1.5 L    Kerman Passey MS, RDN, LDN, CNSC Registered Dietitian 3 Clinical Nutrition RD Pager and On-Call Pager Number Located in [place]

## 2022-04-09 NOTE — Transfer of Care (Signed)
Immediate Anesthesia Transfer of Care Note  Patient: Chelsea [name]  Procedure(s) Performed: CONTROL OF BLEEDING,TRACHEOSTOMY SITE (Neck)  Patient Location: ICU  Anesthesia Type:General  Level of Consciousness: sedated and Patient remains intubated per anesthesia plan  Airway & Oxygen Therapy: Patient remains intubated per anesthesia plan and Patient placed on Ventilator (see vital sign flow sheet for setting)  Post-op Assessment: Report given to RN and Post -op Vital signs reviewed and stable  Post vital signs: Reviewed and stable  Last Vitals:  Vitals Value Taken Time  BP 115/73 04/09/22 0748  Temp 37 C 04/09/22 0750  Pulse 114 04/09/22 0801  Resp 11 04/09/22 0801  SpO2 96 % 04/09/22 0801  Vitals shown include unvalidated device data.  Last Pain:  Vitals:   04/09/22 0750  TempSrc: Oral  PainSc:       Patients Stated Pain Goal: 8 (82/06/01 5615)  Complications: No notable events documented.

## 2022-04-09 NOTE — Progress Notes (Signed)
Patient ID: Chelsea [name], female   DOB: [birth year], 56 y.o.   MRN: [id number]  Stable in ICU.  Some bloody oozing with secretions around trach site.  No active bleeding.  I told the nurse that she can suction gently under the trach flange to clear out secretions and bloody drainage.  There is a fairly good space around the tube deep to the skin.

## 2022-04-09 NOTE — Progress Notes (Signed)
Called to bedside to evaluate tracheal bleeding. ENT had packed during the day yesterday. Bleeding began suddenly and has soaked wash cloths. ENT has been called and 1 unit pRBCs ordered for Hb 6.4.  5cc of 1% lidocaine with epi 1:200K injected along the upper border of the tracheostomy site ( from around 10 to 2 o'clock position) after area cleaned with alcohol. Lower border of tracheostomy site not accessible due to body habitus. Wash clothes applied to provide pressure. Not all soaked gauze was pulled away due to concern for making bleeding worse in case there was clot there already.   Saturations now 90% on 60% FiO2> increased to 70%. D/w RT- on PS 20/PEEP 8 getting Vt ~ 530cc. Earlier volumes had been similar. Due to concern for her trach clotting off and volumes dropping, she was transitioned to [facility] 8cc/kg with RR increased.    CTA ordered to evaluate for TI fistula> apparently going to the OR sooner and this won't be needed. INR ordered. RBC transfusing, will order 1 additional unit due to ongoing bleeding. NGT to suction and TF off.   Cc time: 34 min.  Julian Hy, DO 04/09/22 5:52 AM [facility]

## 2022-04-09 NOTE — Op Note (Signed)
Preop diagnosis: Tracheostomy status, tracheostomy hemorrhage Postop diagnosis: same Procedure: Control of tracheostomy hemorrhage Surgeon: Redmond Baseman Assist: None Anesth: General Compl: None Findings: Lot of clot in trach wound.  Active bleeding site superior to trach site at level of trachea, possibly thyroid isthmus vessel. Description:  After discussing risks, benefits, and alternatives, the patient was brought to the operative suite and placed on the operative table in the supine position.  Anesthesia was induced.  Orotracheal intubation was performed by Anesthesia using a Glide scope.  The tracheostomy tube was carefully removed and the patient ventilated via ET tube.  Her oxygen saturation stayed low during the case.  See Anesthesia record.  The anterior neck was prepped and draped in sterile fashion.  Clot was removed from the trach site.  An active bleeding site was identified near the midline above the trach site at the level of the trachea that was controlled with Bipolar electrocautery.  The tracheostomy was then replaced with a #7 proximal XLT trach with cuff after backing the ET tube above the tracheostomy site.  The patient was successfully ventilated through the trach tube.  The trach flange was secured to the neck skin using 2-0 silk in four quadrants.  The patient was cleaned off and a trach dressing and trach tie were added.  The patient was then returned to Anesthesia for further management and then and moved to the intensive care unit.

## 2022-04-09 NOTE — Progress Notes (Signed)
Attempted to instill Cathflo to Medial port of CVC -unable to instill any solution with multiple attempts. Cap changed - RN aware. Medial line left with Dead End cap.

## 2022-04-09 NOTE — Progress Notes (Addendum)
0730 - Spoke with Dr. Lynetta Mare about starting sedation for pt due to still being on paralytic.   0815 - Dr. Lynetta Mare made aware that pt still bleeding from trach site. This RN to call ENT  [phone] - Spoke with Dr. Redmond Baseman, stated that if dressing has to be changed q 30 minutes to 1 hr then that is reasonable, Dr. Redmond Baseman to come assess pt later in the day. Dr. Lynetta Mare made aware.   7583 - Dr. Lynetta Mare at bedside, changing out arterial line.   1130 - Tube feed stopped for cortrack to be placed, NG tube removed by dietician.   1310 - Read back on abd xray for cortrack, contacted Dr. Tamala Julian, able to use cortrack per Dr. Tamala Julian   1700- Pt moved to bari bed  [place] from [facility] called for update on pt.

## 2022-04-09 NOTE — Progress Notes (Signed)
Transported to OR with OR team and RN at this time on vent and pt transitioned to Anesthesia machine. OR to call when ready to transport back to ICU.

## 2022-04-09 NOTE — Progress Notes (Signed)
[facility] ADULT ICU REPLACEMENT PROTOCOL   The patient does apply for the [facility] Adult ICU Electrolyte Replacment Protocol based on the criteria listed below:   1.Exclusion criteria: TCTS patients, ECMO patients, and Dialysis patients 2. Is GFR >/= 30 ml/min? Yes.    Patient's GFR today is >60 3. Is SCr </= 2? Yes.   Patient's SCr is 0.75 mg/dL 4. Did SCr increase >/= 0.5 in 24 hours? No. 5.Pt's weight >40kg  Yes.   6. Abnormal electrolyte(s): K  7. Electrolytes replaced per protocol 8.  Call MD STAT for K+ </= 2.5, Phos </= 1, or Mag </= 1 Physician:  Sherlene Shams [facility] 04/09/2022 5:29 AM

## 2022-04-09 NOTE — Progress Notes (Signed)
[place] Progress Note Patient Name: Chelsea [name] DOB: 01/17/1966 MRN: [id number]   Date of Service  04/09/2022  HPI/Events of Note  Informed of anemia with hgb dropping to 6.4 <-- 8.5 <-- 7.3.  eICU Interventions  Transfuse 1 unit pRBC.     Intervention Category Intermediate Interventions: Bleeding - evaluation and treatment with blood products  Elsie Lincoln 04/09/2022, 4:45 AM

## 2022-04-09 NOTE — TOC Initial Note (Signed)
Transition of Care [facility]) - Initial/Assessment Note    Patient Details  Name: Chelsea [name] MRN: [id number] Date of Birth: 1966/08/05  [facility] CM/SW Contact:    Bethena Roys, RN Phone Number: 04/09/2022, 4:18 PM  Clinical Narrative:  Risk for readmission assessment completed. PTA patient was from [facility]. Plan will be to return once stable to transfer. Case Manager received a call from the [facility] is following the patient to see when medically stable to transfer.  Case Manager following.               Expected Discharge Plan: Long Term Acute Care (LTAC) Barriers to Discharge: Continued Medical Work up   Patient Goals and CMS Choice Patient states their goals for this hospitalization and ongoing recovery are:: to return to [facility].      Expected Discharge Plan and Services Expected Discharge Plan: [place] (LTAC)   Discharge Planning Services: CM Consult Post Acute Care Choice: Long Term Acute Care (LTAC) Living arrangements for the past 2 months: [place] ([facility])                     Prior Living Arrangements/Services Living arrangements for the past 2 months: Post-Acute Facility ([facility])   Patient language and need for interpreter reviewed:: Yes        Need for Family Participation in Patient Care: Yes (Comment) Care giver support system in place?: Yes (comment)   Criminal Activity/Legal Involvement Pertinent to Current Situation/Hospitalization: No - Comment as needed  Activities of Daily Living Home Assistive Devices/Equipment: [place] Hospital bed, Wheelchair ADL Screening (condition at time of admission) Patient's cognitive ability adequate to safely complete daily activities?: No Is the patient deaf or have difficulty hearing?: No Does the patient have difficulty seeing, even when wearing glasses/contacts?: No Does the patient have difficulty concentrating, remembering,  or making decisions?: No Patient able to express need for assistance with ADLs?: Yes Does the patient have difficulty dressing or bathing?: Yes Independently performs ADLs?: No Communication: Dependent Is this a change from baseline?: Pre-admission baseline Dressing (OT): Dependent Is this a change from baseline?: Pre-admission baseline Grooming: Dependent Is this a change from baseline?: Pre-admission baseline Feeding: Dependent Is this a change from baseline?: Pre-admission baseline Bathing: Dependent Is this a change from baseline?: Pre-admission baseline Toileting: Dependent Is this a change from baseline?: Pre-admission baseline In/Out Bed: Dependent Is this a change from baseline?: Pre-admission baseline Walks in Home: Dependent Is this a change from baseline?: Pre-admission baseline Does the patient have difficulty walking or climbing stairs?: Yes Weakness of Legs: Both Weakness of Arms/Hands: Both  Permission Sought/Granted Permission sought to share information with : Case Manager, Investment banker, corporate granted to share info w AGENCY: [facility]          Alcohol / Substance Use: Not Applicable Psych Involvement: No (comment)  Admission diagnosis:  Bleeding [R58] Bleeding of the respiratory tract [R04.9] Patient Active Problem List   Diagnosis Date Noted   Bleeding of the respiratory tract 04/07/2022   Acute on chronic respiratory failure with hypoxia and hypercapnia ([place]) 04/07/2022   Hypotension arterial 04/07/2022   Depression 04/20/2021   History of tobacco use 04/20/2021   Hyperglycemia due to type 2 diabetes mellitus ([place]) 04/20/2021   Irritable bowel syndrome with constipation 04/20/2021   Microalbuminuria 04/20/2021   On supplemental oxygen by nasal cannula 04/20/2021   Renal insufficiency 04/20/2021  Heart failure ([place]) 04/20/2021   Moderate aortic stenosis 04/20/2021   Mild tricuspid regurgitation 04/02/2021   Mitral  valve sclerosis 04/02/2021   Morbid obesity with BMI of 50.0-59.9, adult ([place]) 04/02/2021   Non-smoker 04/02/2021   Awaiting admission to adequate facility elsewhere 16/07/9603   Metabolic alkalosis 54/06/8118   Restless leg syndrome 03/22/2021   Weakness 03/20/2021   Acute congestive heart failure ([place]) 03/18/2021   Hx of atrial flutter 03/15/2021   Weight loss 03/15/2021   Atrial flutter ([place]) 03/09/2021   S/P hysterectomy 02/19/2021   Pulmonary nodule less than 6 cm determined by computed tomography of lung 02/09/2021   Dyspnea on exertion 02/09/2021   Nocturnal hypoxemia 06/03/2020   Mixed hyperlipidemia 04/18/2020   Hair loss 09/17/2019   Chronic pain of multiple joints 06/18/2019   Irritable bowel syndrome with diarrhea 06/18/2019   Onychomycosis 06/18/2019   Acute idiopathic gout of left hand 02/16/2019   Tinea pedis of both feet 11/12/2018   Hypertension associated with diabetes ([place]) 08/25/2018   Class 3 severe obesity with serious comorbidity and body mass index (BMI) of 50.0 to 59.9 in adult [facility]) 08/25/2018   Chronic respiratory failure with hypoxia ([place]) 07/02/2018   Physical debility 05/10/2018   Hepatitis C    Neuropathy due to type 2 diabetes mellitus ([place]) 02/24/2018   Chronic heart failure with preserved ejection fraction ([place]) 02/23/2018   Dyspnea 12/05/2017   Acute on chronic diastolic CHF (congestive heart failure), NYHA class 2 ([place]) 09/25/2017   Pneumonia due to gram-positive bacteria 08/24/2017   Acute respiratory failure with hypoxia and hypercarbia ([place]) 05/27/2016   Hyperlipidemia 09/28/2015   Chronic obstructive pulmonary disease, unspecified ([place]) 03/04/2015   Bronchitis 05/30/2011   Cellulitis, leg 04/29/2011   Neuropathic pain, leg 04/09/2011   Mononeuropathy of lower extremity 04/09/2011   Edema 03/22/2011   Morbid obesity ([place]) 03/22/2011   History of cervical cancer 03/22/2011   Lymphedema of both lower extremities 03/22/2011   Morbid  (severe) obesity due to excess calories ([place]) 03/22/2011   Ankle pain, left 03/14/2011   Obesity hypoventilation syndrome ([place]) 01/01/2011   DEGENERATIVE JOINT DISEASE 11/28/2010   Chronic diastolic heart failure ([place]) 11/14/2010   UNSPECIFIED HYPOTHYROIDISM 11/13/2010   UNSPEC COMBINED SYSTOLIC&DIASTOLIC HEART FAILURE 14/78/2956   HYPERTHYROIDISM 11/10/2010   DM 11/10/2010   Obstructive sleep apnea 11/10/2010   Essential hypertension 11/10/2010   GERD 11/10/2010   Gastro-esophageal reflux disease without esophagitis 11/10/2010   Essential (primary) hypertension 11/10/2010   Obstructive sleep apnea (adult) (pediatric) 11/10/2010   Type 2 diabetes mellitus, without long-term current use of insulin ([place]) 11/10/2010   PCP:  Rudene Anda, MD Pharmacy:   [id number] [place], [place] - [id number] [id number]: [phone]: [phone]   Readmission Risk Interventions    04/09/2022    4:17 PM  Readmission Risk Prevention Plan  Transportation Screening Complete  PCP or Specialist Appt within 3-5 Days Complete  HRI or Home Care Consult Complete  Social Work Consult for [place]/Counseling Complete  Palliative Care Screening Not Applicable  Medication Review (Press photographer Referral to Pharmacy

## 2022-04-09 NOTE — Progress Notes (Signed)
Patient ID: Chelsea [name], female   DOB: 30-Jul-1966, 56 y.o.   MRN: [id number]  [name] site bled again this morning fairly heavily.  Hgb 8 down to 6.  Currently not bleeding but discussed situation with her POA, Sonia Side, and we agreed to proceed to the operating room for surgical exploration and control of hemorrhage.

## 2022-04-09 NOTE — Progress Notes (Signed)
[place] Progress Note Patient Name: Chelsea [name] DOB: 1965/11/05 MRN: [id number]   Date of Service  04/09/2022  HPI/Events of Note  K+ 3.4  eICU Interventions  KCL 40 meq via enteral tube per protocol x 1 ordered.     Intervention Category Minor Interventions: Electrolytes abnormality - evaluation and management  Frederik Pear 04/09/2022, 11:43 PM

## 2022-04-09 NOTE — Brief Op Note (Signed)
04/09/2022  7:22 AM  PATIENT:  Chelsea [name]  56 y.o. female  PRE-OPERATIVE DIAGNOSIS:  BLEEDING TRACH SITE  POST-OPERATIVE DIAGNOSIS:  BLEEDING TRACH SITE  PROCEDURE:  Procedure(s): CONTROL OF BLEEDING,TRACHEOSTOMY SITE (N/A)  SURGEON:  Surgeon(s) and Role:    * Melida Quitter, MD - Primary  PHYSICIAN ASSISTANT:   ASSISTANTS: none   ANESTHESIA:   general  EBL:  Mnimal   BLOOD ADMINISTERED: 1 unit CC PRBC  DRAINS: none   LOCAL MEDICATIONS USED:  NONE  SPECIMEN:  No Specimen  DISPOSITION OF SPECIMEN:  N/A  COUNTS:  YES  TOURNIQUET:  * No tourniquets in log *  DICTATION: .Note written in EPIC  PLAN OF CARE:  Return to ICU  PATIENT DISPOSITION:  ICU - intubated and critically ill.   Delay start of Pharmacological VTE agent (>24hrs) due to surgical blood loss or risk of bleeding: yes

## 2022-04-09 NOTE — Progress Notes (Signed)
Arterial Catheter Insertion Procedure Note  Chelsea [name]  [id number]  11-24-1965  Date:04/09/22  Time:9:40 AM    Provider Performing: Kipp Brood    Procedure: Insertion of Arterial Line [phone]) without US guidance  Indication(s) Blood pressure monitoring and/or need for frequent ABGs  Consent Risks of the procedure as well as the alternatives and risks of each were explained to the patient and/or caregiver.  Consent for the procedure was obtained and is signed in the bedside chart  Anesthesia None   Time Out Verified patient identification, verified procedure, site/side was marked, verified correct patient position, special equipment/implants available, medications/allergies/relevant history reviewed, required imaging and test results available.   Sterile Technique Maximal sterile technique including full sterile barrier drape, hand hygiene, sterile gown, sterile gloves, mask, hair covering, sterile ultrasound probe cover (if used).   Procedure Description Area of catheter insertion was cleaned with chlorhexidine and draped in sterile fashion. Without real-time ultrasound guidance an arterial catheter was placed into the right radial artery.  Appropriate arterial tracings confirmed on monitor.    Unable to draw back on existing arterial line but tracing present. Wire exchange performed with good flashback and excellent, non-positional tracing with longer catheter in place.    Complications/Tolerance None; patient tolerated the procedure well.   EBL Minimal   Specimen(s) None   Kipp Brood, MD [facility] ICU Physician [place]  Pager: [phone] Or Epic Secure Chat After hours: [phone].  04/09/2022, 9:42 AM

## 2022-04-09 NOTE — Procedures (Signed)
Cortrak  Person Inserting Tube:  Maylon Peppers C, RD Tube Type:  Cortrak - 43 inches Tube Size:  10 Tube Location:  Left nare Secured by: Bridle Technique Used to Measure Tube Placement:  Marking at nare/corner of mouth Cortrak Secured At:  67 cm   Cortrak Tube Team Note:  Consult received to place a Cortrak feeding tube.   X-ray is required, abdominal x-ray has been ordered by the Cortrak team. Please confirm tube placement before using the Cortrak tube.   If the tube becomes dislodged please keep the tube and contact the Cortrak team at [email or web address] (password [email or web address]) for replacement.  If after hours and replacement cannot be delayed, place a NG tube and confirm placement with an abdominal x-ray.   Lockie Pares., RD, LDN, CNSC See AMiON for contact information

## 2022-04-09 NOTE — Progress Notes (Addendum)
[place] Progress Note Patient Name: Chelsea [name] DOB: 1965-12-16 MRN: [id number]   Date of Service  04/09/2022  HPI/Events of Note  Notified of bleeding from tracheostomy site.   INR 1.4.  Hgb had dropped to 6.4.  eICU Interventions  Transfuse 1 unit pRBC.  Ground team called to the bedside to evaluate.  Pt given lidocaine with epi. Get CTA neck. Pt listed an allergy to contrast and will have to pre-medicated with steroids and benadryl.   ENT Dr. Redmond Baseman was notified regarding bleeding.  His recommendation was to apply pressure and he will schedule OR for the patient today.      Intervention Category Major Interventions: Hemorrhage - evaluation and management  Elsie Lincoln 04/09/2022, 5:35 AM

## 2022-04-09 NOTE — Anesthesia Preprocedure Evaluation (Addendum)
Anesthesia Evaluation  Patient identified by MRN, date of birth, ID band Patient unresponsive    Reviewed: Allergy & Precautions, Patient's Chart, lab work & pertinent test resultsPreop documentation limited or incomplete due to emergent nature of procedure.  Airway Mallampati: Trach       Dental   Pulmonary asthma , sleep apnea, Continuous Positive Airway Pressure Ventilation and Oxygen sleep apnea , COPD,  COPD inhaler and oxygen dependent,  Obesity hypoventilation syndrome   trach  breath sounds clear to auscultation       Cardiovascular hypertension, + CAD and +CHF  Normal cardiovascular exam+ Valvular Problems/Murmurs AS and MR   ECHO: 1. Moderate to severe aortic stenosis.  2. Left ventricular ejection fraction, by estimation, is 60 to 65%. The  left ventricle has normal function. The left ventricle has no regional  wall motion abnormalities. There is mild left ventricular hypertrophy.  Left ventricular diastolic parameters  are consistent with Grade I diastolic dysfunction (impaired relaxation).  3. Right ventricular systolic function is normal. The right ventricular  size is normal.  4. Left atrial size was severely dilated.  5. The mitral valve is normal in structure. No evidence of mitral valve  regurgitation. No evidence of mitral stenosis.  6. The aortic valve is normal in structure. Aortic valve regurgitation is  mild. Moderate to severe aortic valve stenosis. Aortic valve mean gradient  measures 38.0 mmHg. Aortic valve Vmax measures 3.84 m/s.  7. The inferior vena cava is normal in size with greater than 50%  respiratory variability, suggesting right atrial pressure of 3 mmHg.    Neuro/Psych  Headaches, PSYCHIATRIC DISORDERS Depression  Neuromuscular disease    GI/Hepatic GERD  ,(+) Hepatitis -  Endo/Other  diabetesMorbid obesity  Renal/GU negative Renal ROS     Musculoskeletal  (+) Arthritis ,    Abdominal (+) + obese,   Peds  Hematology  (+) Blood dyscrasia, anemia ,   Anesthesia Other Findings BLEEDING TRACH SITE  Reproductive/Obstetrics                            Anesthesia Physical Anesthesia Plan  ASA: 4 and emergent  Anesthesia Plan: General   Post-op Pain Management:    Induction: Intravenous  PONV Risk Score and Plan: 3 and Treatment may vary due to age or medical condition  Airway Management Planned: Tracheostomy  Additional Equipment:   Intra-op Plan:   Post-operative Plan: Post-operative intubation/ventilation  Informed Consent: I have reviewed the patients History and Physical, chart, labs and discussed the procedure including the risks, benefits and alternatives for the proposed anesthesia with the patient or authorized representative who has indicated his/her understanding and acceptance.     Only emergency history available and Consent reviewed with POA  Plan Discussed with: Surgeon and CRNA  Anesthesia Plan Comments: (Anesthetic plan discussed with significant other via telephone)       Anesthesia Quick Evaluation

## 2022-04-09 NOTE — Anesthesia Procedure Notes (Signed)
Procedure Name: Intubation Date/Time: 04/09/2022 6:51 AM  Performed by: Dorann Lodge, CRNAPre-anesthesia Checklist: Patient identified, Emergency Drugs available, Suction available and Patient being monitored Patient Re-evaluated:Patient Re-evaluated prior to induction Oxygen Delivery Method: Circle System Utilized Preoxygenation: Pre-oxygenation with 100% oxygen Induction Type: Combination inhalational/ intravenous induction and Tracheostomy Laryngoscope Size: Glidescope and 3 Grade View: Grade I Tube type: Reinforced Tube size: 7.0 mm Number of attempts: 1 Airway Equipment and Method: Video-laryngoscopy and Rigid stylet Placement Confirmation: ETT inserted through vocal cords under direct vision, positive ETCO2 and breath sounds checked- equal and bilateral Secured at: 21 cm Tube secured with: Tape Dental Injury: Teeth and Oropharynx as per pre-operative assessment  Difficulty Due To: Difficulty was anticipated Comments: Trach removed by Dr. Redmond Baseman, Reinforced ETT placed without difficulty by Dr. Roanna Banning with +ETCO2. Tidal volumes 100s- 200s.

## 2022-04-10 ENCOUNTER — Encounter (HOSPITAL_COMMUNITY): Payer: Self-pay | Admitting: Otolaryngology

## 2022-04-10 ENCOUNTER — Inpatient Hospital Stay (HOSPITAL_COMMUNITY): Payer: Medicare Other

## 2022-04-10 DIAGNOSIS — J9622 Acute and chronic respiratory failure with hypercapnia: Secondary | ICD-10-CM

## 2022-04-10 DIAGNOSIS — J9621 Acute and chronic respiratory failure with hypoxia: Secondary | ICD-10-CM

## 2022-04-10 LAB — HEMOGLOBIN AND HEMATOCRIT, BLOOD
HCT: 22 % — ABNORMAL LOW (ref 36.0–46.0)
HCT: 26 % — ABNORMAL LOW (ref 36.0–46.0)
Hemoglobin: 6.8 g/dL — CL (ref 12.0–15.0)
Hemoglobin: 7.7 g/dL — ABNORMAL LOW (ref 12.0–15.0)

## 2022-04-10 LAB — CULTURE, RESPIRATORY W GRAM STAIN: Culture: NORMAL

## 2022-04-10 LAB — BASIC METABOLIC PANEL
Anion gap: 9 (ref 5–15)
Anion gap: 8 (ref 5–15)
BUN: 22 mg/dL — ABNORMAL HIGH (ref 6–20)
BUN: 19 mg/dL (ref 6–20)
CO2: 33 mmol/L — ABNORMAL HIGH (ref 22–32)
CO2: 33 mmol/L — ABNORMAL HIGH (ref 22–32)
Calcium: 8.1 mg/dL — ABNORMAL LOW (ref 8.9–10.3)
Calcium: 7.7 mg/dL — ABNORMAL LOW (ref 8.9–10.3)
Chloride: 100 mmol/L (ref 98–111)
Chloride: 99 mmol/L (ref 98–111)
Creatinine, Ser: 0.57 mg/dL (ref 0.44–1.00)
Creatinine, Ser: 0.51 mg/dL (ref 0.44–1.00)
GFR, Estimated: >60 mL/min (ref >60)
GFR, Estimated: >60 mL/min (ref >60)
Glucose, Bld: 231 mg/dL — ABNORMAL HIGH (ref 70–99)
Glucose, Bld: 290 mg/dL — ABNORMAL HIGH (ref 70–99)
Potassium: 4 mmol/L (ref 3.5–5.1)
Potassium: 3.6 mmol/L (ref 3.5–5.1)
Sodium: 142 mmol/L (ref 135–145)
Sodium: 140 mmol/L (ref 135–145)

## 2022-04-10 LAB — CBC
HCT: 21.7 % — ABNORMAL LOW (ref 36.0–46.0)
Hemoglobin: 6.6 g/dL — CL (ref 12.0–15.0)
MCH: 27.4 pg (ref 26.0–34.0)
MCHC: 30.4 g/dL (ref 30.0–36.0)
MCV: 90 fL (ref 80.0–100.0)
Platelets: 252 10*3/uL (ref 150–400)
RBC: 2.41 MIL/uL — ABNORMAL LOW (ref 3.87–5.11)
RDW: 15.7 % — ABNORMAL HIGH (ref 11.5–15.5)
WBC: 16.1 10*3/uL — ABNORMAL HIGH (ref 4.0–10.5)
nRBC: 0 % (ref 0.0–0.2)

## 2022-04-10 LAB — PREPARE RBC (CROSSMATCH)

## 2022-04-10 LAB — GLUCOSE, CAPILLARY
Glucose-Capillary: 217 mg/dL — ABNORMAL HIGH (ref 70–99)
Glucose-Capillary: 272 mg/dL — ABNORMAL HIGH (ref 70–99)
Glucose-Capillary: 270 mg/dL — ABNORMAL HIGH (ref 70–99)
Glucose-Capillary: 227 mg/dL — ABNORMAL HIGH (ref 70–99)
Glucose-Capillary: 189 mg/dL — ABNORMAL HIGH (ref 70–99)

## 2022-04-10 LAB — PHOSPHORUS: Phosphorus: 2.1 mg/dL — ABNORMAL LOW (ref 2.5–4.6)

## 2022-04-10 LAB — SARS CORONAVIRUS 2 BY RT PCR: SARS Coronavirus 2 by RT PCR: NEGATIVE

## 2022-04-10 LAB — MAGNESIUM: Magnesium: 2 mg/dL (ref 1.7–2.4)

## 2022-04-10 LAB — TRIGLYCERIDES: Triglycerides: 114 mg/dL (ref <150)

## 2022-04-10 MED ORDER — SODIUM CHLORIDE 0.9% IV SOLUTION: Freq: Once | INTRAVENOUS | Status: AC

## 2022-04-10 MED ORDER — SODIUM CHLORIDE 0.9% IV SOLUTION
Freq: Once | INTRAVENOUS | Status: AC
Start: 2022-04-10 — End: 2022-04-10

## 2022-04-10 MED ORDER — LEVOTHYROXINE SODIUM 75 MCG PO TABS
150.0000 ug | ORAL_TABLET | Freq: Every day | ORAL | Status: DC
  Administered 2022-04-11 – 2022-04-22 (×12): 150 ug
  Filled 2022-04-10 (×12): qty 2

## 2022-04-10 MED ORDER — POTASSIUM CHLORIDE 20 MEQ PO PACK
40.0000 meq | PACK | Freq: Once | ORAL | Status: AC
Start: 2022-04-10 — End: 2022-04-10
  Administered 2022-04-10: 40 meq
  Filled 2022-04-10: qty 2

## 2022-04-10 MED ORDER — LEVOTHYROXINE SODIUM 75 MCG PO TABS: 150.0000 ug | ORAL_TABLET | Freq: Every day | ORAL | Status: DC

## 2022-04-10 MED ORDER — VITAMIN B-12 1000 MCG PO TABS
1000.0000 ug | ORAL_TABLET | Freq: Every day | ORAL | Status: DC
  Administered 2022-04-10 – 2022-04-22 (×13): 1000 ug
  Filled 2022-04-10 (×13): qty 1

## 2022-04-10 MED ORDER — ORAL CARE MOUTH RINSE: 15.0000 mL | OROMUCOSAL | Status: DC | PRN

## 2022-04-10 MED ORDER — BUDESONIDE 0.5 MG/2ML IN SUSP
0.5000 mg | Freq: Two times a day (BID) | RESPIRATORY_TRACT | Status: DC
Start: 2022-04-10 — End: 2022-04-16
  Administered 2022-04-10 – 2022-04-16 (×12): 0.5 mg via RESPIRATORY_TRACT
  Filled 2022-04-10 (×12): qty 2

## 2022-04-10 MED ORDER — ATORVASTATIN CALCIUM 10 MG PO TABS
20.0000 mg | ORAL_TABLET | Freq: Every day | ORAL | Status: DC
Start: 2022-04-10 — End: 2022-04-22
  Administered 2022-04-10 – 2022-04-22 (×13): 20 mg
  Filled 2022-04-10 (×13): qty 2

## 2022-04-10 MED ORDER — ORAL CARE MOUTH RINSE
15.0000 mL | OROMUCOSAL | Status: DC
  Administered 2022-04-11 – 2022-04-21 (×114): 15 mL via OROMUCOSAL

## 2022-04-10 MED ORDER — FOLIC ACID 1 MG PO TABS
1.0000 mg | ORAL_TABLET | Freq: Every day | ORAL | Status: DC
Start: 2022-04-10 — End: 2022-04-22
  Administered 2022-04-10 – 2022-04-22 (×13): 1 mg
  Filled 2022-04-10 (×13): qty 1

## 2022-04-10 MED ORDER — INSULIN ASPART 100 UNIT/ML IJ SOLN
10.0000 [IU] | INTRAMUSCULAR | Status: DC
  Administered 2022-04-10 – 2022-04-16 (×33): 10 [IU] via SUBCUTANEOUS

## 2022-04-10 MED ORDER — SODIUM PHOSPHATES 45 MMOLE/15ML IV SOLN
15.0000 mmol | Freq: Once | INTRAVENOUS | Status: AC
  Administered 2022-04-10: 15 mmol via INTRAVENOUS
  Filled 2022-04-10: qty 5

## 2022-04-10 MED ORDER — ACETAMINOPHEN 160 MG/5ML PO SOLN
650.0000 mg | Freq: Four times a day (QID) | ORAL | Status: DC | PRN
  Administered 2022-04-10 – 2022-04-17 (×6): 650 mg via ORAL
  Filled 2022-04-10 (×6): qty 20.3

## 2022-04-10 MED ORDER — ESCITALOPRAM OXALATE 10 MG PO TABS
10.0000 mg | ORAL_TABLET | Freq: Every day | ORAL | Status: DC
Start: 2022-04-10 — End: 2022-04-22
  Administered 2022-04-10 – 2022-04-22 (×13): 10 mg
  Filled 2022-04-10 (×13): qty 1

## 2022-04-10 NOTE — Anesthesia Postprocedure Evaluation (Signed)
Anesthesia Post Note  Patient: Chelsea [name]  Procedure(s) Performed: CONTROL OF BLEEDING,TRACHEOSTOMY SITE (Neck)     Patient location during evaluation: SICU Anesthesia Type: General Level of consciousness: sedated Pain management: pain level controlled Vital Signs Assessment: post-procedure vital signs reviewed and stable Respiratory status: patient remains intubated per anesthesia plan Cardiovascular status: stable Postop Assessment: no apparent nausea or vomiting Anesthetic complications: yes   Encounter Notable Events  Notable Event Outcome Phase Comment  Difficult to intubate - expected  Intraprocedure Filed from anesthesia note documentation.    Last Vitals:  Vitals:   04/10/22 0930 04/10/22 0945  BP: 98/60 110/64  Pulse: 64 66  Resp: (!) 21 (!) 22  Temp:    SpO2: 100% 100%    Last Pain:  Vitals:   04/10/22 0650  TempSrc: Oral  PainSc:                  Santa Lighter

## 2022-04-10 NOTE — Progress Notes (Signed)
Inpatient Diabetes Program Recommendations  AACE/ADA: New Consensus Statement on Inpatient Glycemic Control (2015)  Target Ranges:  Prepandial:   less than 140 mg/dL      Peak postprandial:   less than 180 mg/dL (1-2 hours)      Critically ill patients:  140 - 180 mg/dL   Lab Results  Component Value Date   GLUCAP 272 (H) 04/10/2022   HGBA1C 5.9 (H) 04/07/2022    Review of Glycemic Control  Latest Reference Range & Units 04/09/22 11:11 04/09/22 16:24 04/09/22 20:18 04/09/22 23:07 04/10/22 04:13 04/10/22 07:19  Glucose-Capillary 70 - 99 mg/dL 271 (H) 262 (H) 228 (H) 211 (H) 217 (H) 272 (H)   Diabetes history: DM 2 Outpatient Diabetes medications:  Lantus 20 units Current orders for Inpatient glycemic control:  Vital high protein- 65 ml/hr Novolog 0-20 units q 4 hours Novolog 6 units q 4 hours Semglee 20 units daily  Inpatient Diabetes Program Recommendations:    Consider increasing Novolog to 10 units q 4 hours (tube feed coverage).   Thanks,  Adah Perl, RN, BC-ADM Inpatient Diabetes Coordinator Pager [phone]  (8a-5p)

## 2022-04-10 NOTE — Progress Notes (Signed)
[facility] ADULT ICU REPLACEMENT PROTOCOL   The patient does apply for the [facility] Adult ICU Electrolyte Replacment Protocol based on the criteria listed below:   1.Exclusion criteria: TCTS patients, ECMO patients, and Dialysis patients 2. Is GFR >/= 30 ml/min? Yes.    Patient's GFR today is >60 3. Is SCr </= 2? Yes.   Patient's SCr is 0.57 mg/dL 4. Did SCr increase >/= 0.5 in 24 hours? No. 5.Pt's weight >40kg  Yes.   6. Abnormal electrolyte(s): Phos 2.1  7. Electrolytes replaced per protocol 8.  Call MD STAT for K+ </= 2.5, Phos </= 1, or Mag </= 1 Physician:  Ignacia Marvel 04/10/2022 5:39 AM

## 2022-04-10 NOTE — Progress Notes (Signed)
NAME:  Chelsea [name], MRN:  [id number], DOB:  Aug 05, 1966, LOS: 3 ADMISSION DATE:  04/07/2022, CONSULTATION DATE:  04/07/2022 REFERRING MD:  Dr. Francia Greaves, CHIEF COMPLAINT:  Bleeding trach    History of Present Illness:  Chelsea [name] is a 56 y.o. female who resides at [facility] with history of CHF with CAD, COPD, OSA, atrial fibrillation HLD, HTN, diabetes, and chronic back pain who presented to the ED from LTAC due to bleeding tracheostomy.  Per family tracheostomy was performed last week at [place].  Daughter reports patient has had significant forceful cough that began overnight and into today which she believes has led to trach bleeding.  Per family patient has not progressed to trach collar trials yet at [place].  On ED arrival patient was seen with mild tachycardia and eventual development of mild hypotension but remained alert and oriented.  Lab work on admission significant for chloride 88, glucose 384, BUN 29, albumin 2.8, AST 10, hemoglobin 8.0, INR 1.7.  ENT was consulted as well as PCCM for management of profusely bleeding tracheostomy.  ENT was able to pack the trach in the emergency department with control and bleeding.  And will admit and monitor in ICU for continued need of ventilator support  Pertinent  Medical History   CHF with CAD, COPD, atrial fibrillation OSA, HLD, HTN, diabetes, and chronic back pain  Significant Hospital Events: Including procedures, antibiotic start and stop dates in addition to other pertinent events   7/8 presented from [facility] with profusely bleeding tracheostomya 7/10 OR for ligation of bleeding vessel 7/11 sedated on mech vent; bleeding improved from trach; received 2 units prbcs; on 15 mcg levo and vaso 0.03  Interim History / Subjective:  Patient remains sedated on propofol/fent On PRVC mech vent Trach bleeding improved today Received 2 units PRBC's hgb <7 On 15 mcg levo and vaso 0.03  Objective   Blood pressure 110/64, pulse  66, temperature (!) 101.2 F (38.4 C), temperature source Oral, resp. rate (!) 22, height '5\' 5"'$  (1.651 m), weight (!) 181.9 kg, SpO2 100 %.    Vent Mode: PRVC FiO2 (%):  [80 %-100 %] 80 % Set Rate:  [22 bmp] 22 bmp Vt Set:  [450 mL] 450 mL PEEP:  [8 cmH20-10 cmH20] 8 cmH20 Plateau Pressure:  [23 cmH20-40 cmH20] 23 cmH20   Intake/Output Summary (Last 24 hours) at 04/10/2022 1018 Last data filed at 04/10/2022 0731 Gross per 24 hour  Intake 3045.29 ml  Output 1480 ml  Net 1565.29 ml    Filed Weights   04/07/22 1403 04/08/22 0500 04/10/22 0602  Weight: (!) 163.3 kg (!) 150.9 kg (!) 181.9 kg    Examination: General:  acute on chronic trached critically ill appearing on mech vent HEENT: MM pink/moist; trach in place w/ minimal bleeding Neuro: sedated; PERRL CV: s1s2, RRR, no m/r/g PULM:  dim clear BS bilaterally; trached on mech vent PRVC GI: soft, bsx4 active  Extremities: warm/dry, trace edema  Skin: no rashes or lesions appreciated  Resolved Hospital Problem list     Assessment & Plan:  Bleeding tracheostomy P: -ENT ligated 7/10 -continue to monitor for signs of bleeding -scd for dvt ppx -trend cbc and transfuse <7  Shock: septic, hemorrhagic?, sedation related P: -continue to wean pressors for Map goal >65 -continue cefepime; f/u cultures -trend wbc/fever curve -trend cbc; transfuse for hgb <7 -weaning sedation today for RASS 0 to -1  Obstructive sleep apnea with OHS  Chronic hypoxic respiratory failure now status post  percutaneous tracheostomy with vent dependence Concern for possible evolving ventilator associated pneumonia P: -LTVV strategy with tidal volumes of 6-8 cc/kg ideal body weight -Goal plateau pressures less than 30 and driving pressures less than 15 -Wean PEEP/FiO2 for SpO2 >92% -VAP bundle in place -Daily SAT and SBT -PAD protocol in place -wean sedation today for RASS 0 to -1 -Follow intermittent CXR and ABG PRN -continue cefepime; f/u resp  cx  History of diastolic congestive heart failure Chronic atrial fibrillation anticoagulated with Eliquis History of hypertension/hyperlipidemia Moderate aortic stenosis P: -daily weights/ strict I/o's -hold anti-hypertensives -continue midodrine -scd for dvt ppx; hold eliquis  Type 2 diabetes P: -ssi and cbg monitoring -increase TF coverage -continue semglee  Morbid obesity P: -weight loss counseling when appropriate  Best Practice (right click and "Reselect all SmartList Selections" daily)   Diet/type: NPO w/ meds via tube tube feeds. DVT prophylaxis: SCD GI prophylaxis: PPI Lines: N/A Foley:  Yes, and it is still needed Code Status:  full code Last date of multidisciplinary goals of care discussion: 7/11 attempted to call significant other Dewayne by phone but no answer  CRITICAL CARE Performed by: Mick Sell   Total critical care time: 40 minutes  JD Rexene Agent [place] [facility] 04/10/2022, 11:54 AM  Please see [email or web address] for pager details.  From 7A-7P if no response, please call [phone]. After hours, please call ELink [phone].

## 2022-04-10 NOTE — Progress Notes (Addendum)
[place] Progress Note Patient Name: Chelsea [name] DOB: [birth year] MRN: [id number]   Date of Service  04/10/2022  HPI/Events of Note  Hemoglobin 6.6 gm / dl, no evidence of active hemorrhage currently, temp spike to 102.  eICU Interventions  Transfuse 1 unit PRBC, trend H & H, culture blood and respiratory secretions if not already cultured. CXR this AM.        Kerry Kass Renarda Mullinix 04/10/2022, 4:38 AM

## 2022-04-10 NOTE — Progress Notes (Signed)
Sputum sample obtained by RN and sent to lab at this time.

## 2022-04-10 NOTE — Progress Notes (Signed)
[facility] Progress Note Patient Name: Chelsea [name] DOB: 16-Apr-1966 MRN: [id number]   Date of Service  04/10/2022  HPI/Events of Note  Patient is having frequent loose stools and needs an order for Flexiseal.  eICU Interventions  Flexiseal ordered.        Kerry Kass Nyasiah Moffet 04/10/2022, 6:08 AM

## 2022-04-11 ENCOUNTER — Inpatient Hospital Stay (HOSPITAL_COMMUNITY): Payer: Medicare Other

## 2022-04-11 DIAGNOSIS — J9621 Acute and chronic respiratory failure with hypoxia: Secondary | ICD-10-CM | POA: Diagnosis not present

## 2022-04-11 LAB — TYPE AND SCREEN
ABO/RH(D): A POS
Antibody Screen: NEGATIVE
Unit division: 0
Unit division: 0
Unit division: 0
Unit division: 0
Unit division: 0

## 2022-04-11 LAB — BPAM RBC
Blood Product Expiration Date: 202307142359
Blood Product Expiration Date: 202307242359
Blood Product Expiration Date: 202307262359
Blood Product Expiration Date: 202307262359
Blood Product Expiration Date: 202307262359
ISSUE DATE / TIME: 202307082130
ISSUE DATE / TIME: 202307100526
ISSUE DATE / TIME: 202307100559
ISSUE DATE / TIME: 202307110459
ISSUE DATE / TIME: 202307111209
Unit Type and Rh: 6200
Unit Type and Rh: 6200
Unit Type and Rh: 6200
Unit Type and Rh: 6200
Unit Type and Rh: 6200

## 2022-04-11 LAB — BASIC METABOLIC PANEL
Anion gap: 7 (ref 5–15)
BUN: 16 mg/dL (ref 6–20)
CO2: 36 mmol/L — ABNORMAL HIGH (ref 22–32)
Calcium: 8 mg/dL — ABNORMAL LOW (ref 8.9–10.3)
Chloride: 101 mmol/L (ref 98–111)
Creatinine, Ser: 0.47 mg/dL (ref 0.44–1.00)
GFR, Estimated: >60 mL/min (ref >60)
Glucose, Bld: 177 mg/dL — ABNORMAL HIGH (ref 70–99)
Potassium: 3.8 mmol/L (ref 3.5–5.1)
Sodium: 144 mmol/L (ref 135–145)

## 2022-04-11 LAB — GLUCOSE, CAPILLARY
Glucose-Capillary: 121 mg/dL — ABNORMAL HIGH (ref 70–99)
Glucose-Capillary: 147 mg/dL — ABNORMAL HIGH (ref 70–99)
Glucose-Capillary: 154 mg/dL — ABNORMAL HIGH (ref 70–99)
Glucose-Capillary: 154 mg/dL — ABNORMAL HIGH (ref 70–99)
Glucose-Capillary: 165 mg/dL — ABNORMAL HIGH (ref 70–99)
Glucose-Capillary: 167 mg/dL — ABNORMAL HIGH (ref 70–99)
Glucose-Capillary: 173 mg/dL — ABNORMAL HIGH (ref 70–99)

## 2022-04-11 LAB — CBC
HCT: 24.2 % — ABNORMAL LOW (ref 36.0–46.0)
Hemoglobin: 7.3 g/dL — ABNORMAL LOW (ref 12.0–15.0)
MCH: 27.4 pg (ref 26.0–34.0)
MCHC: 30.2 g/dL (ref 30.0–36.0)
MCV: 91 fL (ref 80.0–100.0)
Platelets: 230 10*3/uL (ref 150–400)
RBC: 2.66 MIL/uL — ABNORMAL LOW (ref 3.87–5.11)
RDW: 16.5 % — ABNORMAL HIGH (ref 11.5–15.5)
WBC: 9.8 10*3/uL (ref 4.0–10.5)
nRBC: 0 % (ref 0.0–0.2)

## 2022-04-11 LAB — CULTURE, BLOOD (ROUTINE X 2): Special Requests: ADEQUATE

## 2022-04-11 LAB — PHOSPHORUS: Phosphorus: 3.3 mg/dL (ref 2.5–4.6)

## 2022-04-11 LAB — HEPARIN LEVEL (UNFRACTIONATED): Heparin Unfractionated: 0.16 IU/mL — ABNORMAL LOW (ref 0.30–0.70)

## 2022-04-11 LAB — MAGNESIUM: Magnesium: 2 mg/dL (ref 1.7–2.4)

## 2022-04-11 MED ORDER — FENTANYL CITRATE PF 50 MCG/ML IJ SOSY
50.0000 ug | PREFILLED_SYRINGE | INTRAMUSCULAR | Status: DC | PRN
  Filled 2022-04-11: qty 2

## 2022-04-11 MED ORDER — SODIUM CHLORIDE 0.9 % IV SOLN
2.0000 g | Freq: Three times a day (TID) | INTRAVENOUS | Status: DC
  Administered 2022-04-11 – 2022-04-13 (×6): 2 g via INTRAVENOUS
  Filled 2022-04-11 (×6): qty 12.5

## 2022-04-11 MED ORDER — FENTANYL CITRATE PF 50 MCG/ML IJ SOSY
50.0000 ug | PREFILLED_SYRINGE | INTRAMUSCULAR | Status: DC | PRN
  Administered 2022-04-12 (×2): 50 ug via INTRAVENOUS
  Filled 2022-04-11: qty 1

## 2022-04-11 MED ORDER — HEPARIN (PORCINE) 25000 UT/250ML-% IV SOLN
1700.0000 [IU]/h | INTRAVENOUS | Status: DC
  Administered 2022-04-11: 1200 [IU]/h via INTRAVENOUS
  Filled 2022-04-11: qty 250

## 2022-04-11 NOTE — TOC Progression Note (Addendum)
Transition of Care [facility]) - Progression Note    Patient Details  Name: OLUWASEMILORE PASCUZZI MRN: [id number] Date of Birth: August 12, 1966  Transition of Care [facility]) CM/SW Contact  Graves-Bigelow, Ocie Cornfield, RN Phone Number: 04/11/2022, 11:09 AM  Clinical Narrative: Patient discussed in rounds- plan for patient to transition back to [facility]-Thursday or Friday. Case Manager did call the [facility] Liaison and left a detailed voicemail to make them aware. Case Manager awaiting call back from Liaison.   [place] will initiate insurance authorization today. Will await to hear back from insurance.   Expected Discharge Plan: Long Term Acute Care (LTAC) Barriers to Discharge: Continued Medical Work up  Expected Discharge Plan and Services Expected Discharge Plan: Long Term Acute Care (LTAC)   Discharge Planning Services: CM Consult Post Acute Care Choice: Long Term Acute Care (LTAC) Living arrangements for the past 2 months: [place] ([facility])                    Readmission Risk Interventions    04/09/2022    4:17 PM  Readmission Risk Prevention Plan  Transportation Screening Complete  PCP or Specialist Appt within 3-5 Days Complete  HRI or Home Care Consult Complete  Social Work Consult for [place] Planning/Counseling Complete  Palliative Care Screening Not Applicable  Medication Review [employer]) Referral to Pharmacy

## 2022-04-11 NOTE — Progress Notes (Signed)
ANTICOAGULATION CONSULT NOTE - Initial Consult  Pharmacy Consult for heparin Indication: atrial fibrillation  Allergies  Allergen Reactions   Penicillins Anaphylaxis    Patient tolerates cefepime (08/2017)   Iodinated Contrast Media    Minoxidil     Other reaction(s): hives    Patient Measurements: Height: '5\' 5"'$  (165.1 cm) Weight: (!) 181.9 kg (401 lb) IBW/kg (Calculated) : 57 Heparin Dosing Weight: 100kg  Vital Signs: Temp: 100 F (37.8 C) (07/12 1144) Temp Source: Oral (07/12 1144) BP: 107/51 (07/12 0334) Pulse Rate: 97 (07/12 1200)  Labs: Recent Labs    04/09/22 0356 04/09/22 0530 04/09/22 1009 04/10/22 0334 04/10/22 0934 04/10/22 1254 04/10/22 1719 04/11/22 0320  HGB 6.4*  --    < > 6.6* 6.8*  --  7.7* 7.3*  HCT 21.5*  --    < > 21.7* 22.0*  --  26.0* 24.2*  PLT 315  --   --  252  --   --   --  230  LABPROT  --  19.0*  --   --   --   --   --   --   INR  --  1.6*  --   --   --   --   --   --   CREATININE 0.74  --    < > 0.57  --  0.51  --  0.47   < > = values in this interval not displayed.    Estimated Creatinine Clearance: 129.5 mL/min (by C-G formula based on SCr of 0.47 mg/dL).   Medical History: Past Medical History:  Diagnosis Date   Aortic stenosis    Echocardiogram 02/21/11:  Mean gradient 23 mm of mercury; peak gradient 36;; Turbulence noted in the area of the aortic isthmus with peak gradient 23 mmHg-consider MRI to assess for coarctation;    TEE: 04/02/11:  EF 55-60%, mod BAE, trileaflet AV with mild AS (pk and mean 25 and 16), mild AI, mild MR, mild TR, atrial septal aneurysm, no evidence of corarctation   Arthritis    "qwhere" (12/05/2017)   Asthma    Cervical cancer ([place]) 2006   CHF (congestive heart failure) (HCC)    Chronic lower back pain    Complication of anesthesia    "I have a hard time waking up from under it" (12/05/2017)   COPD (chronic obstructive pulmonary disease) ([place])    "lung dr said I don't have this" (12/05/2017)   Coronary  artery disease    Diastolic congestive heart failure ([place])    Echo 02/21/11: EF 55-60%, moderate LVH, grade 2 diastolic dysfunction, mild to moderate aortic stenosis with a mean gradient 23 mm of mercury   GERD (gastroesophageal reflux disease)    Heart murmur    Hepatitis C    History of gout X 1   Hyperlipidemia    Hypertension    Hypothyroid    IBS (irritable bowel syndrome)    Migraine    "monthly" (12/05/2017)   Obesity hypoventilation syndrome ([place])    On home oxygen therapy    "2L all the time" (12/05/2017)   OSA treated with BiPAP    "have CPAP at home too; wearing BiPAP right now" (12/05/2017)   Pneumonia    "several times" (12/05/2017)   Type II diabetes mellitus (HCC)       Assessment: 56yof with history Afib on apixaban PTA.  S/p bleeding at trach site and need for reversal with K-centra.  Bleeding resolved now stable off pressors.  Will initiate heparin drip - no bolus- and see how patient tolerates anticoagulation prior to restarting apixaban.    Goal of Therapy:  Heparin level 0.3-0.7 units/ml Monitor platelets by anticoagulation protocol: Yes   Plan:  Begin heparin drip 1200 uts/hr - no bolus with recent bleeding Heparin level 6hr after start  Daily heparin level and CBC  If tolerates anticoagulation - follow up restart oral AC   Bonnita Nasuti Pharm.D. CPP, BCPS Clinical Pharmacist [phone] 04/11/2022 1:40 PM

## 2022-04-11 NOTE — Progress Notes (Addendum)
NAME:  Chelsea [name], MRN:  [id number], DOB:  02/07/1966, LOS: 4 ADMISSION DATE:  04/07/2022, CONSULTATION DATE:  04/07/2022 REFERRING MD:  Dr. Francia Greaves, CHIEF COMPLAINT:  Bleeding trach    History of Present Illness:  Chelsea [name] is a 56 y.o. female who resides at [facility] with history of CHF with CAD, COPD, OSA, atrial fibrillation HLD, HTN, diabetes, and chronic back pain who presented to the ED from LTAC due to bleeding tracheostomy.  Per family tracheostomy was performed last week at [place].  Daughter reports patient has had significant forceful cough that began overnight and into today which she believes has led to trach bleeding.  Per family patient has not progressed to trach collar trials yet at [place].  On ED arrival patient was seen with mild tachycardia and eventual development of mild hypotension but remained alert and oriented.  Lab work on admission significant for chloride 88, glucose 384, BUN 29, albumin 2.8, AST 10, hemoglobin 8.0, INR 1.7.  ENT was consulted as well as PCCM for management of profusely bleeding tracheostomy.  ENT was able to pack the trach in the emergency department with control and bleeding.  And will admit and monitor in ICU for continued need of ventilator support  Pertinent  Medical History   CHF with CAD, COPD, atrial fibrillation OSA, HLD, HTN, diabetes, and chronic back pain  Significant Hospital Events: Including procedures, antibiotic start and stop dates in addition to other pertinent events   7/8 presented from [facility] with profusely bleeding tracheostomya 7/10 OR for ligation of bleeding vessel 7/11 sedated on mech vent; bleeding improved from trach; received 2 units prbcs; on 15 mcg levo and vaso 0.03  Interim History / Subjective:  Weaned off propofol; on fentanyl 75 mcg; Patient more Aox3; gets agitated if suctioned Thick tan secretions being suctioned On PSV 10/8 50%; sats low 90s On 2 mcg levo; off vaso  Objective    Blood pressure (!) 107/51, pulse 71, temperature (!) 97.3 F (36.3 C), temperature source Axillary, resp. rate (!) 22, height '5\' 5"'$  (1.651 m), weight (!) 181.9 kg, SpO2 90 %.    Vent Mode: PRVC FiO2 (%):  [40 %-80 %] 40 % Set Rate:  [22 bmp-450 bmp] 22 bmp Vt Set:  [450 mL] 450 mL PEEP:  [8 cmH20] 8 cmH20 Plateau Pressure:  [21 cmH20-26 cmH20] 26 cmH20   Intake/Output Summary (Last 24 hours) at 04/11/2022 0726 Last data filed at 04/11/2022 0600 Gross per 24 hour  Intake 3318.59 ml  Output 1995 ml  Net 1323.59 ml    Filed Weights   04/08/22 0500 04/10/22 0602 04/11/22 0500  Weight: (!) 150.9 kg (!) 181.9 kg (!) 181.9 kg    Examination: General:  acute on chronic trached ill appearing on mech vent HEENT: MM pink/moist; trach in place with oozing secretions Neuro: alert; following commands CV: s1s2, RRR, no m/r/g PULM:  dim clear BS bilaterally; trached on mech vent PRVC GI: soft, bsx4 active  Extremities: warm/dry, trace edema  Skin: no rashes or lesions appreciated  Resolved Hospital Problem list     Assessment & Plan:  Bleeding tracheostomy -ENT ligated 7/10 P: -continue to monitor for signs of bleeding -will start heparin gtt today for 24 hours then transition to eliquis if no bleeding tomorrow -trend cbc and transfuse <7  Shock: septic, hemorrhagic?, sedation related P: -continue to wean pressors for Map goal >65 -continue cefepime x 7days; f/u cultures -trend wbc/fever curve -trend cbc; transfuse for hgb <7 -weaning  sedation off today  Obstructive sleep apnea with OHS  Chronic hypoxic respiratory failure now status post percutaneous tracheostomy with vent dependence Concern for possible evolving ventilator associated pneumonia P: -PSV trials as tolerated; consider TCT if doing well -rest on PRVC LTVV strategy with tidal volumes of 6-8 cc/kg ideal body weight -Goal plateau pressures less than 30 and driving pressures less than 15 -Wean PEEP/FiO2 for SpO2  >92% -VAP bundle in place -trach care per protocol -PAD protocol in place -wean sedation off today -Follow intermittent CXR and ABG PRN -continue cefepime; f/u resp cx  History of diastolic congestive heart failure Chronic atrial fibrillation anticoagulated with Eliquis History of hypertension/hyperlipidemia Moderate aortic stenosis P: -daily weights/ strict I/o's -hold anti-hypertensives -continue midodrine -heparin gtt today; transition to eliquis tomorrow if no bleeding  Type 2 diabetes P: -cont ssi and cbg monitoring -cont TF coverage -continue semglee  Morbid obesity P: -weight loss counseling when appropriate  Best Practice (right click and "Reselect all SmartList Selections" daily)   Diet/type: NPO w/ meds via tube tube feeds. DVT prophylaxis: SCD GI prophylaxis: PPI Lines: N/A Foley:  Yes, and it is still needed Code Status:  full code Last date of multidisciplinary goals of care discussion: 7/12 Chelsea [name] updated over phone  CRITICAL CARE Performed by: Mick Sell   Total critical care time: 35 minutes  JD Rexene Agent  Pulmonary & Critical Care 04/11/2022, 7:26 AM  Please see [email or web address] for pager details.  From 7A-7P if no response, please call [phone]. After hours, please call ELink [phone].

## 2022-04-11 NOTE — Progress Notes (Signed)
[facility] Progress Note Patient Name: TIEGAN JAMBOR DOB: 1966-04-23 MRN: [id number]   Date of Service  04/11/2022  HPI/Events of Note  Nursing request for CXR this AM.   eICU Interventions  Will order portable CXR now.     Intervention Category Major Interventions: Respiratory failure - evaluation and management  Yousaf Sainato [place] 04/11/2022, 6:15 AM

## 2022-04-11 NOTE — Progress Notes (Signed)
ANTICOAGULATION CONSULT NOTE  Pharmacy Consult for heparin Indication: atrial fibrillation  Allergies  Allergen Reactions   Penicillins Anaphylaxis    Patient tolerates cefepime (08/2017)   Iodinated Contrast Media    Minoxidil     Other reaction(s): hives    Patient Measurements: Height: '5\' 5"'$  (165.1 cm) Weight: (!) 181.9 kg (401 lb) IBW/kg (Calculated) : 57 Heparin Dosing Weight: 100kg  Vital Signs: Temp: 100 F (37.8 C) (07/12 1144) Temp Source: Oral (07/12 1144) Pulse Rate: 75 (07/12 1800)  Labs: Recent Labs    04/09/22 0356 04/09/22 0530 04/09/22 1009 04/10/22 0334 04/10/22 0934 04/10/22 1254 04/10/22 1719 04/11/22 0320 04/11/22 1738  HGB 6.4*  --    < > 6.6* 6.8*  --  7.7* 7.3*  --   HCT 21.5*  --    < > 21.7* 22.0*  --  26.0* 24.2*  --   PLT 315  --   --  252  --   --   --  230  --   LABPROT  --  19.0*  --   --   --   --   --   --   --   INR  --  1.6*  --   --   --   --   --   --   --   HEPARINUNFRC  --   --   --   --   --   --   --   --  0.16*  CREATININE 0.74  --    < > 0.57  --  0.51  --  0.47  --    < > = values in this interval not displayed.     Estimated Creatinine Clearance: 129.5 mL/min (by C-G formula based on SCr of 0.47 mg/dL).   Medical History: Past Medical History:  Diagnosis Date   Aortic stenosis    Echocardiogram 02/21/11:  Mean gradient 23 mm of mercury; peak gradient 36;; Turbulence noted in the area of the aortic isthmus with peak gradient 23 mmHg-consider MRI to assess for coarctation;    TEE: 04/02/11:  EF 55-60%, mod BAE, trileaflet AV with mild AS (pk and mean 25 and 16), mild AI, mild MR, mild TR, atrial septal aneurysm, no evidence of corarctation   Arthritis    "qwhere" (12/05/2017)   Asthma    Cervical cancer ([place]) 2006   CHF (congestive heart failure) (HCC)    Chronic lower back pain    Complication of anesthesia    "I have a hard time waking up from under it" (12/05/2017)   COPD (chronic obstructive pulmonary disease)  ([place])    "lung dr said I don't have this" (12/05/2017)   Coronary artery disease    Diastolic congestive heart failure ([place])    Echo 02/21/11: EF 55-60%, moderate LVH, grade 2 diastolic dysfunction, mild to moderate aortic stenosis with a mean gradient 23 mm of mercury   GERD (gastroesophageal reflux disease)    Heart murmur    Hepatitis C    History of gout X 1   Hyperlipidemia    Hypertension    Hypothyroid    IBS (irritable bowel syndrome)    Migraine    "monthly" (12/05/2017)   Obesity hypoventilation syndrome ([place])    On home oxygen therapy    "2L all the time" (12/05/2017)   OSA treated with BiPAP    "have CPAP at home too; wearing BiPAP right now" (12/05/2017)   Pneumonia    "several times" (12/05/2017)  Type II diabetes mellitus (HCC)       Assessment: 56yof with history Afib on apixaban PTA.  S/p bleeding at trach site and need for reversal with K-centra.  Bleeding resolved now stable off pressors.    Plan to see how patient tolerates anticoagulation prior to restarting apixaban.  Initial heparin level came back subtherapeutic at 0.16, on 1200 units/hr. No s/sx of bleeding or infusion issues.   Goal of Therapy:  Heparin level 0.3-0.7 units/ml Monitor platelets by anticoagulation protocol: Yes   Plan:  Increase heparin infusion to 1450 units/hr - no bolus with recent bleeding Heparin level 6hr Daily heparin level and CBC  If tolerates anticoagulation - follow up restart oral AC  Antonietta Jewel, PharmD, [place] Pharmacist  Phone: [phone] 04/11/2022 7:01 PM  Please check AMION for all [place] phone numbers After 10:00 PM, call [place] [phone]

## 2022-04-12 DIAGNOSIS — J9621 Acute and chronic respiratory failure with hypoxia: Secondary | ICD-10-CM | POA: Diagnosis not present

## 2022-04-12 LAB — CULTURE, RESPIRATORY W GRAM STAIN

## 2022-04-12 LAB — BASIC METABOLIC PANEL
Anion gap: 7 (ref 5–15)
BUN: 13 mg/dL (ref 6–20)
CO2: 34 mmol/L — ABNORMAL HIGH (ref 22–32)
Calcium: 7.5 mg/dL — ABNORMAL LOW (ref 8.9–10.3)
Chloride: 102 mmol/L (ref 98–111)
Creatinine, Ser: 0.44 mg/dL (ref 0.44–1.00)
GFR, Estimated: >60 mL/min (ref >60)
Glucose, Bld: 133 mg/dL — ABNORMAL HIGH (ref 70–99)
Potassium: 3.5 mmol/L (ref 3.5–5.1)
Sodium: 143 mmol/L (ref 135–145)

## 2022-04-12 LAB — HEMOGLOBIN AND HEMATOCRIT, BLOOD
HCT: 23.9 % — ABNORMAL LOW (ref 36.0–46.0)
HCT: 24.2 % — ABNORMAL LOW (ref 36.0–46.0)
Hemoglobin: 7.2 g/dL — ABNORMAL LOW (ref 12.0–15.0)
Hemoglobin: 7.4 g/dL — ABNORMAL LOW (ref 12.0–15.0)

## 2022-04-12 LAB — GLUCOSE, CAPILLARY
Glucose-Capillary: 127 mg/dL — ABNORMAL HIGH (ref 70–99)
Glucose-Capillary: 140 mg/dL — ABNORMAL HIGH (ref 70–99)
Glucose-Capillary: 198 mg/dL — ABNORMAL HIGH (ref 70–99)
Glucose-Capillary: 159 mg/dL — ABNORMAL HIGH (ref 70–99)
Glucose-Capillary: 181 mg/dL — ABNORMAL HIGH (ref 70–99)
Glucose-Capillary: 144 mg/dL — ABNORMAL HIGH (ref 70–99)

## 2022-04-12 LAB — CBC
HCT: 22.3 % — ABNORMAL LOW (ref 36.0–46.0)
Hemoglobin: 6.8 g/dL — CL (ref 12.0–15.0)
MCH: 27.6 pg (ref 26.0–34.0)
MCHC: 30.5 g/dL (ref 30.0–36.0)
MCV: 90.7 fL (ref 80.0–100.0)
Platelets: 193 10*3/uL (ref 150–400)
RBC: 2.46 MIL/uL — ABNORMAL LOW (ref 3.87–5.11)
RDW: 15.8 % — ABNORMAL HIGH (ref 11.5–15.5)
WBC: 6.6 10*3/uL (ref 4.0–10.5)
nRBC: 0 % (ref 0.0–0.2)

## 2022-04-12 LAB — PHOSPHORUS: Phosphorus: 1.9 mg/dL — ABNORMAL LOW (ref 2.5–4.6)

## 2022-04-12 LAB — OCCULT BLOOD X 1 CARD TO LAB, STOOL: Fecal Occult Bld: POSITIVE — AB

## 2022-04-12 LAB — HEPARIN LEVEL (UNFRACTIONATED): Heparin Unfractionated: 0.17 IU/mL — ABNORMAL LOW (ref 0.30–0.70)

## 2022-04-12 LAB — PREPARE RBC (CROSSMATCH)

## 2022-04-12 MED ORDER — PANTOPRAZOLE 80MG IVPB - SIMPLE MED
80.0000 mg | Freq: Once | INTRAVENOUS | Status: AC
  Administered 2022-04-12: 80 mg via INTRAVENOUS
  Filled 2022-04-12: qty 80

## 2022-04-12 MED ORDER — PANTOPRAZOLE SODIUM 40 MG IV SOLR
40.0000 mg | Freq: Two times a day (BID) | INTRAVENOUS | Status: DC
  Administered 2022-04-12 – 2022-04-16 (×8): 40 mg via INTRAVENOUS
  Filled 2022-04-12 (×9): qty 10

## 2022-04-12 MED ORDER — SODIUM CHLORIDE 0.9% IV SOLUTION: Freq: Once | INTRAVENOUS | Status: DC

## 2022-04-12 MED ORDER — HEPARIN (PORCINE) 25000 UT/250ML-% IV SOLN
2400.0000 [IU]/h | INTRAVENOUS | Status: DC
Start: 2022-04-12 — End: 2022-04-16
  Administered 2022-04-12: 1200 [IU]/h via INTRAVENOUS
  Administered 2022-04-13: 1300 [IU]/h via INTRAVENOUS
  Administered 2022-04-14: 1550 [IU]/h via INTRAVENOUS
  Administered 2022-04-15: 1900 [IU]/h via INTRAVENOUS
  Administered 2022-04-15 – 2022-04-16 (×2): 2300 [IU]/h via INTRAVENOUS
  Filled 2022-04-12 (×7): qty 250

## 2022-04-12 MED ORDER — POTASSIUM PHOSPHATES 15 MMOLE/5ML IV SOLN
30.0000 mmol | Freq: Once | INTRAVENOUS | Status: AC
  Administered 2022-04-12: 30 mmol via INTRAVENOUS
  Filled 2022-04-12: qty 10

## 2022-04-12 MED ORDER — HEPARIN (PORCINE) 25000 UT/250ML-% IV SOLN: 1200.0000 [IU]/h | INTRAVENOUS | Status: DC

## 2022-04-12 NOTE — Progress Notes (Signed)
ANTICOAGULATION CONSULT NOTE - Follow Up Consult  Pharmacy Consult for heparin Indication: atrial fibrillation  Allergies  Allergen Reactions   Penicillins Anaphylaxis    Patient tolerates cefepime (08/2017)   Iodinated Contrast Media    Minoxidil     Other reaction(s): hives    Patient Measurements: Height: '5\' 5"'$  (165.1 cm) Weight: (!) 177.4 kg (391 lb) IBW/kg (Calculated) : 57 Heparin Dosing Weight: 100kg  Vital Signs: Temp: 99.4 F (37.4 C) (07/13 1129) Temp Source: Oral (07/13 1129) BP: 108/42 (07/13 1300) Pulse Rate: 80 (07/13 1300)  Labs: Recent Labs    04/10/22 0334 04/10/22 0934 04/10/22 1254 04/10/22 1719 04/11/22 0320 04/11/22 1738 04/12/22 0212 04/12/22 0347 04/12/22 0909  HGB 6.6*   < >  --    < > 7.3*  --   --  6.8* 7.4*  HCT 21.7*   < >  --    < > 24.2*  --   --  22.3* 24.2*  PLT 252  --   --   --  230  --   --  193  --   HEPARINUNFRC  --   --   --   --   --  0.16* 0.17*  --   --   CREATININE 0.57  --  0.51  --  0.47  --   --  0.44  --    < > = values in this interval not displayed.     Estimated Creatinine Clearance: 127.3 mL/min (by C-G formula based on SCr of 0.44 mg/dL).   Medical History: Past Medical History:  Diagnosis Date   Aortic stenosis    Echocardiogram 02/21/11:  Mean gradient 23 mm of mercury; peak gradient 36;; Turbulence noted in the area of the aortic isthmus with peak gradient 23 mmHg-consider MRI to assess for coarctation;    TEE: 04/02/11:  EF 55-60%, mod BAE, trileaflet AV with mild AS (pk and mean 25 and 16), mild AI, mild MR, mild TR, atrial septal aneurysm, no evidence of corarctation   Arthritis    "qwhere" (12/05/2017)   Asthma    Cervical cancer ([place]) 2006   CHF (congestive heart failure) (HCC)    Chronic lower back pain    Complication of anesthesia    "I have a hard time waking up from under it" (12/05/2017)   COPD (chronic obstructive pulmonary disease) ([place])    "lung dr said I don't have this" (12/05/2017)    Coronary artery disease    Diastolic congestive heart failure ([place])    Echo 02/21/11: EF 55-60%, moderate LVH, grade 2 diastolic dysfunction, mild to moderate aortic stenosis with a mean gradient 23 mm of mercury   GERD (gastroesophageal reflux disease)    Heart murmur    Hepatitis C    History of gout X 1   Hyperlipidemia    Hypertension    Hypothyroid    IBS (irritable bowel syndrome)    Migraine    "monthly" (12/05/2017)   Obesity hypoventilation syndrome ([place])    On home oxygen therapy    "2L all the time" (12/05/2017)   OSA treated with BiPAP    "have CPAP at home too; wearing BiPAP right now" (12/05/2017)   Pneumonia    "several times" (12/05/2017)   Type II diabetes mellitus (HCC)       Assessment: 56yof with history Afib on apixaban PTA.  S/p bleeding at trach site and need for reversal with K-centra.  Bleeding resolved now stable off pressors.   Started  heparin drip - no bolus- last pm and patient had bloody BM overnight and hgb down to 6.  S/P prbc and hgb up to 7 will restart heparin low dose no bolus an monitor patient for bleeding prior to re-initiating apixaban.    Goal of Therapy:  Heparin level 0.3-0.7 units/ml Monitor platelets by anticoagulation protocol: Yes   Plan:  Begin heparin drip 1200 uts/hr - no bolus with recent bleeding Daily heparin level and CBC  If tolerates anticoagulation - follow up restart oral AC   Bonnita Nasuti Pharm.D. CPP, BCPS Clinical Pharmacist [phone] 04/12/2022 2:45 PM

## 2022-04-12 NOTE — Progress Notes (Signed)
[facility] ADULT ICU REPLACEMENT PROTOCOL   The patient does apply for the [facility] Adult ICU Electrolyte Replacment Protocol based on the criteria listed below:   1.Exclusion criteria: TCTS patients, ECMO patients, and Dialysis patients 2. Is GFR >/= 30 ml/min? Yes.    Patient's GFR today is >60 3. Is SCr </= 2? Yes.   Patient's SCr is 0.44 mg/dL 4. Did SCr increase >/= 0.5 in 24 hours? No. 5.Pt's weight >40kg  Yes.   6. Abnormal electrolyte(s): Phos 1.9  7. Electrolytes replaced per protocol 8.  Call MD STAT for K+ </= 2.5, Phos </= 1, or Mag </= 1 Physician:  Randie Heinz 04/12/2022 4:56 AM

## 2022-04-12 NOTE — Progress Notes (Signed)
Called Spouse Dewayne and updated over phone.  JD Rexene Agent [facility] 04/12/2022, 11:17 AM  Please see [email or web address] for pager details.  From 7A-7P if no response, please call [phone]. After hours, please call ELink [phone].

## 2022-04-12 NOTE — Progress Notes (Addendum)
NAME:  Chelsea [name], MRN:  [id number], DOB:  1966-05-16, LOS: 5 ADMISSION DATE:  04/07/2022, CONSULTATION DATE:  04/07/2022 REFERRING MD:  Dr. Francia Greaves, CHIEF COMPLAINT:  Bleeding trach    History of Present Illness:  Chelsea [name] is a 56 y.o. female who resides at [facility] with history of CHF with CAD, COPD, OSA, atrial fibrillation HLD, HTN, diabetes, and chronic back pain who presented to the ED from LTAC due to bleeding tracheostomy.  Per family tracheostomy was performed last week at [facility].  Daughter reports patient has had significant forceful cough that began overnight and into today which she believes has led to trach bleeding.  Per family patient has not progressed to trach collar trials yet at [facility].  On ED arrival patient was seen with mild tachycardia and eventual development of mild hypotension but remained alert and oriented.  Lab work on admission significant for chloride 88, glucose 384, BUN 29, albumin 2.8, AST 10, hemoglobin 8.0, INR 1.7.  ENT was consulted as well as PCCM for management of profusely bleeding tracheostomy.  ENT was able to pack the trach in the emergency department with control and bleeding.  And will admit and monitor in ICU for continued need of ventilator support  Pertinent  Medical History   CHF with CAD, COPD, atrial fibrillation OSA, HLD, HTN, diabetes, and chronic back pain  Significant Hospital Events: Including procedures, antibiotic start and stop dates in addition to other pertinent events   7/8 presented from [facility] with profusely bleeding tracheostomya 7/10 OR for ligation of bleeding vessel 7/11 sedated on mech vent; bleeding improved from trach; received 2 units prbcs; on 15 mcg levo and vaso 0.03 7/12: restarted heparin; off levo 7/13: black stools appreciated; ifob positive; hgb 6.8 being transfused  Interim History / Subjective:  Overnight black stools appreciated; ifob positive; hgb 6.8 being transfused 1 unit  prbc Heparin held  Remains on vent PRVC AO K/phos low being repleted  Objective   Blood pressure (!) 107/40, pulse 90, temperature 99.1 F (37.3 C), temperature source Oral, resp. rate (!) 23, height '5\' 5"'$  (1.651 m), weight (!) 177.4 kg, SpO2 99 %.    Vent Mode: PRVC FiO2 (%):  [40 %-60 %] 60 % Set Rate:  [22 bmp] 22 bmp Vt Set:  [450 mL] 450 mL PEEP:  [8 cmH20] 8 cmH20 Pressure Support:  [12 cmH20] 12 cmH20 Plateau Pressure:  [23 cmH20] 23 cmH20   Intake/Output Summary (Last 24 hours) at 04/12/2022 0729 Last data filed at 04/12/2022 7846 Gross per 24 hour  Intake 2085.09 ml  Output 1805 ml  Net 280.09 ml    Filed Weights   04/10/22 0602 04/11/22 0500 04/12/22 0452  Weight: (!) 181.9 kg (!) 181.9 kg (!) 177.4 kg    Examination: General:  acute on chronic trached ill appearing on mech vent HEENT: MM pink/moist; trach in place Neuro: alert; following commands CV: s1s2, RRR, no m/r/g PULM:  dim clear BS bilaterally; trached on mech vent PRVC GI: soft, bsx4 active; dark stool appreciated Extremities: warm/dry, trace edema  Skin: no rashes or lesions appreciated  Resolved Hospital Problem list     Assessment & Plan:  Bleeding tracheostomy -ENT ligated 7/10 P: -continue to monitor for signs of bleeding -heparin held given low hgb and possible GI bleed -trend cbc and transfuse <7  Shock: septic, hemorrhagic?, sedation related P: -off levo -continue cefepime x 7days; f/u cultures -trend wbc/fever curve -being transfused 1 unit prbc this am -trend cbc;  transfuse for hgb <7  Obstructive sleep apnea with OHS  Chronic hypoxic respiratory failure now status post percutaneous tracheostomy with vent dependence Concern for possible evolving ventilator associated pneumonia -Enterococcus Faecalis in resp culture  P: -PSV trials as tolerated; consider TCT if doing well -rest on PRVC LTVV strategy with tidal volumes of 6-8 cc/kg ideal body weight -Goal plateau pressures  less than 30 and driving pressures less than 15 -Wean PEEP/FiO2 for SpO2 >92% -VAP bundle in place -trach care per protocol -PAD protocol in place -wean sedation off today -Follow intermittent CXR and ABG PRN -continue cefepime -will add vanc for enterococcus faecalis seen in resp cx; susceptibilities pending  Anemia Ifob positive: possible GI bleed P: -hold heparin -continue 1 unit prbc; repeat h/h post transfusion -trend cbc and transfuse for hgb <7 -will start ppi bid -consider GI consult  Hypokalemia Hypophosphatemia P: -repleting k/phosh this am -trend and replete as needed  History of diastolic congestive heart failure Chronic atrial fibrillation anticoagulated with Eliquis History of hypertension/hyperlipidemia Moderate aortic stenosis P: -daily weights/ strict I/o's -hold anti-hypertensives -continue midodrine -hold heparin given possible GI bleed  Type 2 diabetes P: -cont ssi and cbg monitoring -cont TF coverage -continue semglee  Morbid obesity P: -weight loss counseling when appropriate  Best Practice (right click and "Reselect all SmartList Selections" daily)   Diet/type: NPO w/ meds via tube tube feeds. DVT prophylaxis: SCD GI prophylaxis: PPI Lines: N/A Foley:  Yes, and it is still needed Code Status:  full code Last date of multidisciplinary goals of care discussion: 7/12 Dewayne updated over phone  CRITICAL CARE Performed by: Mick Sell   Total critical care time: 35 minutes  JD Rexene Agent [facility] 04/12/2022, 7:29 AM  Please see [email or web address] for pager details.  From 7A-7P if no response, please call [phone]. After hours, please call ELink [phone].

## 2022-04-12 NOTE — Progress Notes (Signed)
[facility] Progress Note Patient Name: Chelsea [name] DOB: [birth year] MRN: [id number]   Date of Service  04/12/2022  HPI/Events of Note  Anemia - Hgb = 6.8. Nursing reports very dark stool in [place]. Currently on a Heparin IV infusion.  eICU Interventions  Plan: Stool for occult blood. Transfuse 1 unit PRBC now. Hold Heparin IV infusion until patient re-evaluated by PCCM rounding team.      Intervention Category Major Interventions: Other:  Janel Beane Cornelia Copa 04/12/2022, 5:03 AM

## 2022-04-12 NOTE — Progress Notes (Addendum)
[name] Progress Note Patient Name: Chelsea [name] DOB: 1966/01/11 MRN: [id number]   Date of Service  04/12/2022  HPI/Events of Note  Hypotension - BP = 87/52 with MAP = 64. No obvious signs of active bleeding. Cardiac echo reveals: 1. Moderate to severe aortic stenosis.  2. Left ventricular ejection fraction, by estimation, is 60 to 65%  eICU Interventions  Plan: H/H STAT.Marland Kitchen     Intervention Category Major Interventions: Hypotension - evaluation and management  Thi Sisemore Eugene 04/12/2022, 11:26 PM

## 2022-04-12 NOTE — Progress Notes (Signed)
ANTICOAGULATION CONSULT NOTE  Pharmacy Consult for heparin Indication: atrial fibrillation  Allergies  Allergen Reactions   Penicillins Anaphylaxis    Patient tolerates cefepime (08/2017)   Iodinated Contrast Media    Minoxidil     Other reaction(s): hives    Patient Measurements: Height: '5\' 5"'$  (165.1 cm) Weight: (!) 181.9 kg (401 lb) IBW/kg (Calculated) : 57 Heparin Dosing Weight: 100kg  Vital Signs: Temp: 98.5 F (36.9 C) (07/12 2014) Temp Source: Oral (07/12 2014) BP: 129/61 (07/12 2302) Pulse Rate: 75 (07/13 0200)  Labs: Recent Labs    04/09/22 0356 04/09/22 0530 04/09/22 1009 04/10/22 0334 04/10/22 0934 04/10/22 1254 04/10/22 1719 04/11/22 0320 04/11/22 1738 04/12/22 0212  HGB 6.4*  --    < > 6.6* 6.8*  --  7.7* 7.3*  --   --   HCT 21.5*  --    < > 21.7* 22.0*  --  26.0* 24.2*  --   --   PLT 315  --   --  252  --   --   --  230  --   --   LABPROT  --  19.0*  --   --   --   --   --   --   --   --   INR  --  1.6*  --   --   --   --   --   --   --   --   HEPARINUNFRC  --   --   --   --   --   --   --   --  0.16* 0.17*  CREATININE 0.74  --    < > 0.57  --  0.51  --  0.47  --   --    < > = values in this interval not displayed.     Estimated Creatinine Clearance: 129.5 mL/min (by C-G formula based on SCr of 0.47 mg/dL).   Medical History: Past Medical History:  Diagnosis Date   Aortic stenosis    Echocardiogram 02/21/11:  Mean gradient 23 mm of mercury; peak gradient 36;; Turbulence noted in the area of the aortic isthmus with peak gradient 23 mmHg-consider MRI to assess for coarctation;    TEE: 04/02/11:  EF 55-60%, mod BAE, trileaflet AV with mild AS (pk and mean 25 and 16), mild AI, mild MR, mild TR, atrial septal aneurysm, no evidence of corarctation   Arthritis    "qwhere" (12/05/2017)   Asthma    Cervical cancer ([place]) 2006   CHF (congestive heart failure) (HCC)    Chronic lower back pain    Complication of anesthesia    "I have a hard time waking up  from under it" (12/05/2017)   COPD (chronic obstructive pulmonary disease) ([place])    "lung dr said I don't have this" (12/05/2017)   Coronary artery disease    Diastolic congestive heart failure ([place])    Echo 02/21/11: EF 55-60%, moderate LVH, grade 2 diastolic dysfunction, mild to moderate aortic stenosis with a mean gradient 23 mm of mercury   GERD (gastroesophageal reflux disease)    Heart murmur    Hepatitis C    History of gout X 1   Hyperlipidemia    Hypertension    Hypothyroid    IBS (irritable bowel syndrome)    Migraine    "monthly" (12/05/2017)   Obesity hypoventilation syndrome ([place])    On home oxygen therapy    "2L all the time" (12/05/2017)   OSA  treated with BiPAP    "have CPAP at home too; wearing BiPAP right now" (12/05/2017)   Pneumonia    "several times" (12/05/2017)   Type II diabetes mellitus ([place])       Assessment: 56yof with history Afib on apixaban PTA.  S/p bleeding at trach site and need for reversal with K-centra.  Bleeding resolved now stable off pressors.    Plan to see how patient tolerates anticoagulation prior to restarting apixaban.  Initial heparin level came back subtherapeutic at 0.16, on 1200 units/hr. No s/sx of bleeding or infusion issues.   7/13 AM update:  Heparin level remains low  Goal of Therapy:  Heparin level 0.3-0.7 units/ml Monitor platelets by anticoagulation protocol: Yes   Plan:  Increase heparin infusion to 1700 units/hr - no bolus with recent bleeding Heparin level in 6-8 hours Daily heparin level and CBC  If tolerates anticoagulation - follow up restart oral AC  Narda Bonds, PharmD, [place] Pharmacist Phone: [phone]

## 2022-04-12 NOTE — TOC CM/SW Note (Signed)
TOC CM received call from [facility] and Peer to Peer is requested for LTAC, call # [phone] option [phone] please have pt's name, Chelsea [name], dob 17-Mar-1966 and mbr ID # [id number]. P2P ends today, 04/12/22 at 330 pm. Unit TOC CM updated and attending. [place], Heart Failure TOC CM [phone]

## 2022-04-13 ENCOUNTER — Encounter (HOSPITAL_COMMUNITY): Payer: Self-pay | Admitting: Pulmonary Disease

## 2022-04-13 DIAGNOSIS — J9622 Acute and chronic respiratory failure with hypercapnia: Secondary | ICD-10-CM | POA: Diagnosis not present

## 2022-04-13 DIAGNOSIS — J9621 Acute and chronic respiratory failure with hypoxia: Secondary | ICD-10-CM | POA: Diagnosis not present

## 2022-04-13 LAB — BPAM RBC
Blood Product Expiration Date: 202307272359
ISSUE DATE / TIME: 202307130621
Unit Type and Rh: 6200

## 2022-04-13 LAB — TYPE AND SCREEN
ABO/RH(D): A POS
Antibody Screen: NEGATIVE
Unit division: 0

## 2022-04-13 LAB — CULTURE, BLOOD (ROUTINE X 2)
Culture: NO GROWTH
Special Requests: ADEQUATE
Special Requests: ADEQUATE

## 2022-04-13 LAB — HEPARIN LEVEL (UNFRACTIONATED)
Heparin Unfractionated: <0.1 IU/mL — ABNORMAL LOW (ref 0.30–0.70)
Heparin Unfractionated: <0.1 IU/mL — ABNORMAL LOW (ref 0.30–0.70)
Heparin Unfractionated: <0.1 IU/mL — ABNORMAL LOW (ref 0.30–0.70)

## 2022-04-13 LAB — CBC
HCT: 26 % — ABNORMAL LOW (ref 36.0–46.0)
Hemoglobin: 7.6 g/dL — ABNORMAL LOW (ref 12.0–15.0)
MCH: 27.2 pg (ref 26.0–34.0)
MCHC: 29.2 g/dL — ABNORMAL LOW (ref 30.0–36.0)
MCV: 93.2 fL (ref 80.0–100.0)
Platelets: 170 10*3/uL (ref 150–400)
RBC: 2.79 MIL/uL — ABNORMAL LOW (ref 3.87–5.11)
RDW: 16.1 % — ABNORMAL HIGH (ref 11.5–15.5)
WBC: 5.4 10*3/uL (ref 4.0–10.5)
nRBC: 0 % (ref 0.0–0.2)

## 2022-04-13 LAB — GLUCOSE, CAPILLARY
Glucose-Capillary: 110 mg/dL — ABNORMAL HIGH (ref 70–99)
Glucose-Capillary: 132 mg/dL — ABNORMAL HIGH (ref 70–99)
Glucose-Capillary: 161 mg/dL — ABNORMAL HIGH (ref 70–99)
Glucose-Capillary: 145 mg/dL — ABNORMAL HIGH (ref 70–99)
Glucose-Capillary: 132 mg/dL — ABNORMAL HIGH (ref 70–99)

## 2022-04-13 LAB — PHOSPHORUS: Phosphorus: 3 mg/dL (ref 2.5–4.6)

## 2022-04-13 LAB — BASIC METABOLIC PANEL
Anion gap: 10 (ref 5–15)
BUN: 16 mg/dL (ref 6–20)
CO2: 33 mmol/L — ABNORMAL HIGH (ref 22–32)
Calcium: 7.5 mg/dL — ABNORMAL LOW (ref 8.9–10.3)
Chloride: 102 mmol/L (ref 98–111)
Creatinine, Ser: 0.46 mg/dL (ref 0.44–1.00)
GFR, Estimated: >60 mL/min (ref >60)
Glucose, Bld: 132 mg/dL — ABNORMAL HIGH (ref 70–99)
Potassium: 3.5 mmol/L (ref 3.5–5.1)
Sodium: 145 mmol/L (ref 135–145)

## 2022-04-13 MED ORDER — POTASSIUM CHLORIDE 20 MEQ PO PACK
40.0000 meq | PACK | Freq: Once | ORAL | Status: AC
Start: 2022-04-13 — End: 2022-04-13
  Administered 2022-04-13: 40 meq
  Filled 2022-04-13: qty 2

## 2022-04-13 NOTE — Progress Notes (Signed)
ANTICOAGULATION CONSULT NOTE - Follow Up Consult  Pharmacy Consult for heparin Indication: atrial fibrillation  Allergies  Allergen Reactions   Penicillins Anaphylaxis    Patient tolerates cefepime (08/2017)   Iodinated Contrast Media    Minoxidil     Other reaction(s): hives    Patient Measurements: Height: '5\' 5"'$  (165.1 cm) Weight: (!) 187.3 kg (412 lb 14.8 oz) IBW/kg (Calculated) : 57 Heparin Dosing Weight: 100kg  Vital Signs: Temp: 97.6 F (36.4 C) (07/14 0400) Temp Source: Oral (07/14 0400) BP: 107/77 (07/14 0700) Pulse Rate: 71 (07/14 0700)  Labs: Recent Labs    04/11/22 0320 04/11/22 1738 04/12/22 0212 04/12/22 0347 04/12/22 0909 04/12/22 2335 04/13/22 0421  HGB 7.3*  --   --  6.8* 7.4* 7.2* 7.6*  HCT 24.2*  --   --  22.3* 24.2* 23.9* 26.0*  PLT 230  --   --  193  --   --  170  HEPARINUNFRC  --  0.16* 0.17*  --   --   --  <0.10*  CREATININE 0.47  --   --  0.44  --   --  0.46     Estimated Creatinine Clearance: 132 mL/min (by C-G formula based on SCr of 0.46 mg/dL).   Medical History: Past Medical History:  Diagnosis Date   Aortic stenosis    Echocardiogram 02/21/11:  Mean gradient 23 mm of mercury; peak gradient 36;; Turbulence noted in the area of the aortic isthmus with peak gradient 23 mmHg-consider MRI to assess for coarctation;    TEE: 04/02/11:  EF 55-60%, mod BAE, trileaflet AV with mild AS (pk and mean 25 and 16), mild AI, mild MR, mild TR, atrial septal aneurysm, no evidence of corarctation   Arthritis    "qwhere" (12/05/2017)   Asthma    Cervical cancer ([place]) 2006   CHF (congestive heart failure) (HCC)    Chronic lower back pain    Complication of anesthesia    "I have a hard time waking up from under it" (12/05/2017)   COPD (chronic obstructive pulmonary disease) ([place])    "lung dr said I don't have this" (12/05/2017)   Coronary artery disease    Diastolic congestive heart failure ([place])    Echo 02/21/11: EF 55-60%, moderate LVH, grade 2 diastolic  dysfunction, mild to moderate aortic stenosis with a mean gradient 23 mm of mercury   GERD (gastroesophageal reflux disease)    Heart murmur    Hepatitis C    History of gout X 1   Hyperlipidemia    Hypertension    Hypothyroid    IBS (irritable bowel syndrome)    Migraine    "monthly" (12/05/2017)   Obesity hypoventilation syndrome ([place])    On home oxygen therapy    "2L all the time" (12/05/2017)   OSA treated with BiPAP    "have CPAP at home too; wearing BiPAP right now" (12/05/2017)   Pneumonia    "several times" (12/05/2017)   Type II diabetes mellitus (HCC)     Assessment: Chelsea [name] with history Afib on apixaban PTA.  S/p bleeding at trach site and need for reversal with K-centra.  Bleeding resolved now stable off pressors.    Back on heparin gtt at lower rate of 1200 units/hr.  Hgb still low but appears stable.  No bleeding from trach site.  Stool remains dark but not overtly bloody from RN report.  Goal of Therapy:  Heparin level 0.3-0.7 units/ml Monitor platelets by anticoagulation protocol: Yes   Plan:  Increase heparin gtt a little to 1300 units/hr, no bolus with recent bleeding. Daily heparin level and CBC  If tolerates anticoagulation - follow up restart oral AC   [place] Crane, Roylene Reason, [facility] Clinical Pharmacist  04/13/2022 7:45 AM   [facility], [place] pharmacy phone numbers are listed on [email or web address]

## 2022-04-13 NOTE — Progress Notes (Signed)
Pharmacy Antibiotic Note  Chelsea [name] is a 56 y.o. female admitted on 04/07/2022 with pneumonia.  Pharmacy has been consulted for cefepime dosing. -WBC= 26 improved to WNL, afebrile, SCr= 0.46 (although likely falsely low from low muscle mass)  Plan: -Cefepime 2gm IV q8h - scheduled to end 7/16 -Will follow renal function, cultures and clinical progress   Height: '5\' 5"'$  (165.1 cm) Weight: (!) 187.3 kg (412 lb 14.8 oz) IBW/kg (Calculated) : 57  Temp (24hrs), Avg:98.3 F (36.8 C), Min:96.7 F (35.9 C), Max:99.7 F (37.6 C)  Recent Labs  Lab 04/07/22 1415 04/07/22 1436 04/07/22 1604 04/07/22 2024 04/07/22 2214 04/08/22 0404 04/08/22 0659 04/09/22 0356 04/09/22 2006 04/10/22 0334 04/10/22 1254 04/11/22 0320 04/12/22 0347 04/13/22 0421  WBC 14.0*  --   --    < > 41.1*   < >  --  20.0*  --  16.1*  --  9.8 6.6 5.4  CREATININE 0.57   < >  --   --   --    < >  --  0.74   < > 0.57 0.51 0.47 0.44 0.46  LATICACIDVEN 0.9  --  1.6  --  1.8  --  1.2  --   --   --   --   --   --   --    < > = values in this interval not displayed.     Estimated Creatinine Clearance: 132 mL/min (by C-G formula based on SCr of 0.46 mg/dL).    Allergies  Allergen Reactions   Penicillins Anaphylaxis    Patient tolerates cefepime (08/2017)   Iodinated Contrast Media    Minoxidil     Other reaction(s): hives    Cefepime 7/8>> (7/16)  Bcx 7/8 - GPC in 1/4 bottles and BCID with strep species. Possible contaminant Trach cx 7/8 - normal flora Resp Cx 7/11 > moderate enterococcus  Thank you for allowing pharmacy to be a part of this patient's care.  [place] Crane, Roylene Reason, [facility] Clinical Pharmacist  04/13/2022 7:53 AM   [facility] pharmacy phone numbers are listed on [email or web address]

## 2022-04-13 NOTE — Plan of Care (Signed)

## 2022-04-13 NOTE — Consult Note (Signed)
[facility] Nurse Consult Note: Patient receiving care in [place]. Reason for Consult: heel wound Wound type: DTPI Pressure Injury POA: No Measurement:0.4 cm x 0.4 cm Wound bed: intact tissue, maroon in color. Drainage (amount, consistency, odor) none Periwound: intact Dressing procedure/placement/frequency: Twice daily application of iodine, allow to dry, then place foot back into Prevalon heel lift boot.  Monitor the wound area(s) for worsening of condition such as: Signs/symptoms of infection,  Increase in size,  Development of or worsening of odor, Development of pain, or increased pain at the affected locations.  Notify the medical team if any of these develop.  Thank you for the consult.  Discussed plan of care with the patient and bedside nurse.  Val Riles, RN, MSN, CWOCN, CNS-BC, pager [phone]

## 2022-04-13 NOTE — Progress Notes (Signed)
NAME:  Chelsea [name], MRN:  [id number], DOB:  05-Oct-1965, LOS: 6 ADMISSION DATE:  04/07/2022, CONSULTATION DATE:  04/07/2022 REFERRING MD:  Dr. Francia Greaves, CHIEF COMPLAINT:  Bleeding trach    History of Present Illness:  Chelsea [name] is a 56 y.o. female who resides at [facility] with history of CHF with CAD, COPD, OSA, atrial fibrillation HLD, HTN, diabetes, and chronic back pain who presented to the ED from LTAC due to bleeding tracheostomy.  Per family tracheostomy was performed last week at [facility].  Daughter reports patient has had significant forceful cough that began overnight and into today which she believes has led to trach bleeding.  Per family patient has not progressed to trach collar trials yet at [facility].  On ED arrival patient was seen with mild tachycardia and eventual development of mild hypotension but remained alert and oriented.  Lab work on admission significant for chloride 88, glucose 384, BUN 29, albumin 2.8, AST 10, hemoglobin 8.0, INR 1.7.  ENT was consulted as well as PCCM for management of profusely bleeding tracheostomy.  ENT was able to pack the trach in the emergency department with control and bleeding.  And will admit and monitor in ICU for continued need of ventilator support  Pertinent  Medical History   CHF with CAD, COPD, atrial fibrillation OSA, HLD, HTN, diabetes, and chronic back pain  Significant Hospital Events: Including procedures, antibiotic start and stop dates in addition to other pertinent events   7/8 presented from [facility] with profusely bleeding tracheostomya 7/10 OR for ligation of bleeding vessel 7/11 sedated on mech vent; bleeding improved from trach; received 2 units prbcs; on 15 mcg levo and vaso 0.03 7/12: restarted heparin; off levo 7/13: black stools appreciated; ifob positive; hgb 6.8 got transfused, 7/14 back on full st heparin   Interim History / Subjective:    Objective   Blood pressure 106/65, pulse 72,  temperature 97.6 F (36.4 C), temperature source Axillary, resp. rate 17, height '5\' 5"'$  (1.651 m), weight (Abnormal) 187.3 kg, SpO2 94 %. CVP:  [1 mmHg-24 mmHg] 1 mmHg  Vent Mode: PRVC FiO2 (%):  [50 %-60 %] 60 % Set Rate:  [22 bmp] 22 bmp Vt Set:  [450 mL] 450 mL PEEP:  [8 cmH20] 8 cmH20 Plateau Pressure:  [18 cmH20-28 cmH20] 28 cmH20   Intake/Output Summary (Last 24 hours) at 04/13/2022 0933 Last data filed at 04/13/2022 0700 Gross per 24 hour  Intake 2461.98 ml  Output 1075 ml  Net 1386.98 ml   Filed Weights   04/11/22 0500 04/12/22 0452 04/13/22 0500  Weight: (Abnormal) 181.9 kg (Abnormal) 177.4 kg (Abnormal) 187.3 kg    Examination: General: This is a morbidly obese 56 year old female who remains on full ventilator support HEENT normocephalic atraumatic proximal tracheostomy XLT in place no bleeding today Pulmonary: Diminished bilateral bases no accessory use Cardiac: Regular rate and rhythm, holosystolic murmur noted. Abdomen: Soft nontender Extremities: Warm dry Neuro: Awake interactive GU: Clear yellow.  Resolved Hospital Problem list   Shock (resolved) Sepsis resolved  Assessment & Plan:   Principal Problem:   Bleeding of the respiratory tract Active Problems:   Acute on chronic respiratory failure with hypoxia and hypercapnia (HCC)   Obstructive sleep apnea   Obesity hypoventilation syndrome (HCC)   Chronic respiratory failure with hypoxia (HCC)   Anemia   GERD   Hypothyroidism   Chronic diastolic heart failure (HCC)   Osteoarthritis   History of cervical cancer   Neuropathic pain, leg  Hepatitis C   Chronic pain of multiple joints   Depression   Mixed hyperlipidemia   Neuropathy due to type 2 diabetes mellitus ([place])   Physical debility   Physical deconditioning   Restrictive lung disease secondary to obesity   Venous insufficiency of both lower extremities   Class 3 severe obesity due to excess calories with serious comorbidity and body mass index  (BMI) of 60.0 to 69.9 in adult [facility])   DJD (degenerative joint disease)   Bleeding tracheostomy -ENT ligated 7/10 Plan Monitor trach site w/ full dose heparin resumed If hgb stable on am labd 7/15 resume DOAC   Obstructive sleep apnea with OHS  Chronic hypoxic respiratory failure now status post percutaneous tracheostomy with vent dependence Plan Continue routine trach care Continue full ventilator support VAP bundle  Possible VAP.  Completed 6 days abx, WBC nml. No fever  Plan Dc cefepime  Enterococcus Faecalis in resp culture suspect colonizer  Plan Monitor    Anemia Ifob positive: possible GI bleed Plan Heparin resumed.  PPI BID  Intermittent Fluid and Electrolyte imbalance Plan Monitor   History of diastolic congestive heart failure Chronic atrial fibrillation anticoagulated with Eliquis History of hypertension/hyperlipidemia Moderate aortic stenosis Plan Cont midodrine  Holding antihypertensives   Type 2 diabetes Plan Ssi   Hypothyroidism  Plan Synthroid   Morbid obesity Plan Work on wt loss  Building surveyor (right click and "Reselect all SmartList Selections" daily)   Diet/type: NPO w/ meds via tube tube feeds. DVT prophylaxis: SCD GI prophylaxis: PPI Lines: N/A Foley:  Yes, and it is still needed Code Status:  full code Last date of multidisciplinary goals of care discussion: 7/12 Dewayne updated over phone  CRITICAL CARE Performed by: Clementeen Graham  My cct 34 min  Erick Colace ACNP-BC [place] Pager # [phone] OR # [phone] if no answer

## 2022-04-13 NOTE — Progress Notes (Signed)
[facility] Progress Note Patient Name: Chelsea [name] DOB: 05-13-1966 MRN: [id number]   Date of Service  04/13/2022  HPI/Events of Note  Hgb = 7.4 --> 7.2. BP = 88/59 with MAP = 68.  eICU Interventions  Continue present management.     Intervention Category Major Interventions: Other:  Lysle Dingwall 04/13/2022, 1:39 AM

## 2022-04-13 NOTE — Progress Notes (Signed)
ANTICOAGULATION CONSULT NOTE - Follow Up Consult  Pharmacy Consult for heparin Indication: atrial fibrillation  Allergies  Allergen Reactions   Penicillins Anaphylaxis    Patient tolerates cefepime (08/2017)   Iodinated Contrast Media    Minoxidil     Other reaction(s): hives    Patient Measurements: Height: '5\' 5"'$  (165.1 cm) Weight: (!) 187.3 kg (412 lb 14.8 oz) IBW/kg (Calculated) : 57 Heparin Dosing Weight: 100kg  Vital Signs: Temp: 97.7 F (36.5 C) (07/14 1200) Temp Source: Axillary (07/14 1200) BP: 100/64 (07/14 1113) Pulse Rate: 77 (07/14 1451)  Labs: Recent Labs    04/11/22 0320 04/11/22 1738 04/12/22 0212 04/12/22 0347 04/12/22 0909 04/12/22 2335 04/13/22 0421 04/13/22 1402  HGB 7.3*  --   --  6.8* 7.4* 7.2* 7.6*  --   HCT 24.2*  --   --  22.3* 24.2* 23.9* 26.0*  --   PLT 230  --   --  193  --   --  170  --   HEPARINUNFRC  --    < > 0.17*  --   --   --  <0.10* <0.10*  CREATININE 0.47  --   --  0.44  --   --  0.46  --    < > = values in this interval not displayed.     Estimated Creatinine Clearance: 132 mL/min (by C-G formula based on SCr of 0.46 mg/dL).   Medical History: Past Medical History:  Diagnosis Date   Aortic stenosis    Echocardiogram 02/21/11:  Mean gradient 23 mm of mercury; peak gradient 36;; Turbulence noted in the area of the aortic isthmus with peak gradient 23 mmHg-consider MRI to assess for coarctation;    TEE: 04/02/11:  EF 55-60%, mod BAE, trileaflet AV with mild AS (pk and mean 25 and 16), mild AI, mild MR, mild TR, atrial septal aneurysm, no evidence of corarctation   Arthritis    "qwhere" (12/05/2017)   Asthma    Cervical cancer ([place]) 2006   CHF (congestive heart failure) (HCC)    Chronic lower back pain    Complication of anesthesia    "I have a hard time waking up from under it" (12/05/2017)   COPD (chronic obstructive pulmonary disease) ([place])    "lung dr said I don't have this" (12/05/2017)   Coronary artery disease     Diastolic congestive heart failure ([place])    Echo 02/21/11: EF 55-60%, moderate LVH, grade 2 diastolic dysfunction, mild to moderate aortic stenosis with a mean gradient 23 mm of mercury   GERD (gastroesophageal reflux disease)    Heart murmur    Hepatitis C    History of gout X 1   Hyperlipidemia    Hypertension    Hypotension arterial 04/07/2022   Hypothyroid    IBS (irritable bowel syndrome)    Migraine    "monthly" (12/05/2017)   Obesity hypoventilation syndrome ([place])    On home oxygen therapy    "2L all the time" (12/05/2017)   OSA treated with BiPAP    "have CPAP at home too; wearing BiPAP right now" (12/05/2017)   Pneumonia    "several times" (12/05/2017)   Type II diabetes mellitus (HCC)     Assessment: 56yof with history Afib on apixaban PTA.  S/p bleeding at trach site and need for reversal with K-centra.  Bleeding resolved now stable off pressors.    Back on heparin gtt at lower rate of 1200 units/hr.  Hgb still low but appears stable.  No  bleeding from trach site.  Stool remains dark but not overtly bloody from RN report.  PM f/u - heparin level still undetectable on 1300 units/hr.  No known issues with IV infusion.  Goal of Therapy:  Heparin level 0.3-0.7 units/ml Monitor platelets by anticoagulation protocol: Yes   Plan:  Increase heparin gtt a little to 1450 units/hr, no bolus with recent bleeding. Daily heparin level and CBC  If tolerates anticoagulation - follow up restart oral AC   [place] Crane, Roylene Reason, BCCP Clinical Pharmacist  04/13/2022 3:15 PM   [facility] pharmacy phone numbers are listed on [email or web address]

## 2022-04-14 DIAGNOSIS — D62 Acute posthemorrhagic anemia: Secondary | ICD-10-CM

## 2022-04-14 DIAGNOSIS — Z9911 Dependence on respirator [ventilator] status: Secondary | ICD-10-CM

## 2022-04-14 DIAGNOSIS — J9621 Acute and chronic respiratory failure with hypoxia: Secondary | ICD-10-CM | POA: Diagnosis not present

## 2022-04-14 DIAGNOSIS — R049 Hemorrhage from respiratory passages, unspecified: Secondary | ICD-10-CM | POA: Diagnosis not present

## 2022-04-14 LAB — CBC
HCT: 26.6 % — ABNORMAL LOW (ref 36.0–46.0)
Hemoglobin: 7.7 g/dL — ABNORMAL LOW (ref 12.0–15.0)
MCH: 27.2 pg (ref 26.0–34.0)
MCHC: 28.9 g/dL — ABNORMAL LOW (ref 30.0–36.0)
MCV: 94 fL (ref 80.0–100.0)
Platelets: 182 10*3/uL (ref 150–400)
RBC: 2.83 MIL/uL — ABNORMAL LOW (ref 3.87–5.11)
RDW: 16.2 % — ABNORMAL HIGH (ref 11.5–15.5)
WBC: 4.7 10*3/uL (ref 4.0–10.5)
nRBC: 0 % (ref 0.0–0.2)

## 2022-04-14 LAB — BASIC METABOLIC PANEL
Anion gap: 10 (ref 5–15)
BUN: 15 mg/dL (ref 6–20)
CO2: 32 mmol/L (ref 22–32)
Calcium: 7.9 mg/dL — ABNORMAL LOW (ref 8.9–10.3)
Chloride: 101 mmol/L (ref 98–111)
Creatinine, Ser: 0.39 mg/dL — ABNORMAL LOW (ref 0.44–1.00)
GFR, Estimated: >60 mL/min (ref >60)
Glucose, Bld: 145 mg/dL — ABNORMAL HIGH (ref 70–99)
Potassium: 3.7 mmol/L (ref 3.5–5.1)
Sodium: 143 mmol/L (ref 135–145)

## 2022-04-14 LAB — GLUCOSE, CAPILLARY
Glucose-Capillary: 105 mg/dL — ABNORMAL HIGH (ref 70–99)
Glucose-Capillary: 107 mg/dL — ABNORMAL HIGH (ref 70–99)
Glucose-Capillary: 118 mg/dL — ABNORMAL HIGH (ref 70–99)
Glucose-Capillary: 120 mg/dL — ABNORMAL HIGH (ref 70–99)
Glucose-Capillary: 121 mg/dL — ABNORMAL HIGH (ref 70–99)
Glucose-Capillary: 136 mg/dL — ABNORMAL HIGH (ref 70–99)
Glucose-Capillary: 143 mg/dL — ABNORMAL HIGH (ref 70–99)

## 2022-04-14 LAB — HEPARIN LEVEL (UNFRACTIONATED)
Heparin Unfractionated: <0.1 IU/mL — ABNORMAL LOW (ref 0.30–0.70)
Heparin Unfractionated: <0.1 IU/mL — ABNORMAL LOW (ref 0.30–0.70)

## 2022-04-14 MED ORDER — HEPARIN BOLUS VIA INFUSION
4000.0000 [IU] | Freq: Once | INTRAVENOUS | Status: AC
Start: 2022-04-14 — End: 2022-04-14
  Administered 2022-04-14: 4000 [IU] via INTRAVENOUS
  Filled 2022-04-14: qty 4000

## 2022-04-14 MED ORDER — HEPARIN BOLUS VIA INFUSION
2000.0000 [IU] | Freq: Once | INTRAVENOUS | Status: AC
  Administered 2022-04-14: 2000 [IU] via INTRAVENOUS
  Filled 2022-04-14: qty 2000

## 2022-04-14 MED ORDER — POTASSIUM CHLORIDE 20 MEQ PO PACK
40.0000 meq | PACK | Freq: Once | ORAL | Status: AC
Start: 2022-04-14 — End: 2022-04-14
  Administered 2022-04-14: 40 meq
  Filled 2022-04-14: qty 2

## 2022-04-14 MED ORDER — MIDODRINE HCL 5 MG PO TABS
10.0000 mg | ORAL_TABLET | Freq: Three times a day (TID) | ORAL | Status: DC
Start: 2022-04-14 — End: 2022-04-22
  Administered 2022-04-14 – 2022-04-22 (×34): 10 mg
  Filled 2022-04-14 (×34): qty 2

## 2022-04-14 MED ORDER — LACTATED RINGERS IV BOLUS
500.0000 mL | Freq: Once | INTRAVENOUS | Status: AC
Start: 2022-04-14 — End: 2022-04-14
  Administered 2022-04-14: 500 mL via INTRAVENOUS

## 2022-04-14 NOTE — Progress Notes (Signed)
ANTICOAGULATION CONSULT NOTE  Pharmacy Consult for heparin Indication: atrial fibrillation  Allergies  Allergen Reactions   Penicillins Anaphylaxis    Patient tolerates cefepime (08/2017)   Iodinated Contrast Media    Minoxidil     Other reaction(s): hives    Patient Measurements: Height: '5\' 5"'$  (165.1 cm) Weight: (!) 183.7 kg (405 lb) IBW/kg (Calculated) : 57 Heparin Dosing Weight: 100kg  Vital Signs: Temp: 98.2 F (36.8 C) (07/15 1540) Temp Source: Axillary (07/15 1540) BP: 117/64 (07/15 1800) Pulse Rate: 76 (07/15 1800)  Labs: Recent Labs    04/12/22 0347 04/12/22 0909 04/12/22 2335 04/13/22 0421 04/13/22 1402 04/13/22 2322 04/14/22 0841 04/14/22 1700  HGB 6.8*   < > 7.2* 7.6*  --   --  7.7*  --   HCT 22.3*   < > 23.9* 26.0*  --   --  26.6*  --   PLT 193  --   --  170  --   --  182  --   HEPARINUNFRC  --   --   --  <0.10*   < > <0.10* <0.10* <0.10*  CREATININE 0.44  --   --  0.46  --   --  0.39*  --    < > = values in this interval not displayed.     Estimated Creatinine Clearance: 130.3 mL/min (A) (by C-G formula based on SCr of 0.39 mg/dL (L)).   Medical History: Past Medical History:  Diagnosis Date   Aortic stenosis    Echocardiogram 02/21/11:  Mean gradient 23 mm of mercury; peak gradient 36;; Turbulence noted in the area of the aortic isthmus with peak gradient 23 mmHg-consider MRI to assess for coarctation;    TEE: 04/02/11:  EF 55-60%, mod BAE, trileaflet AV with mild AS (pk and mean 25 and 16), mild AI, mild MR, mild TR, atrial septal aneurysm, no evidence of corarctation   Arthritis    "qwhere" (12/05/2017)   Asthma    Cervical cancer ([place]) 2006   CHF (congestive heart failure) (HCC)    Chronic lower back pain    Complication of anesthesia    "I have a hard time waking up from under it" (12/05/2017)   COPD (chronic obstructive pulmonary disease) ([place])    "lung dr said I don't have this" (12/05/2017)   Coronary artery disease    Diastolic congestive  heart failure ([place])    Echo 02/21/11: EF 55-60%, moderate LVH, grade 2 diastolic dysfunction, mild to moderate aortic stenosis with a mean gradient 23 mm of mercury   GERD (gastroesophageal reflux disease)    Heart murmur    Hepatitis C    History of gout X 1   Hyperlipidemia    Hypertension    Hypotension arterial 04/07/2022   Hypothyroid    IBS (irritable bowel syndrome)    Migraine    "monthly" (12/05/2017)   Obesity hypoventilation syndrome ([place])    On home oxygen therapy    "2L all the time" (12/05/2017)   OSA treated with BiPAP    "have CPAP at home too; wearing BiPAP right now" (12/05/2017)   Pneumonia    "several times" (12/05/2017)   Type II diabetes mellitus (HCC)       Assessment: 56yof with history Afib on apixaban PTA.  S/p bleeding at trach site and need for reversal with K-centra.  Bleeding resolved now stable off pressors.    Plan to see how patient tolerates anticoagulation prior to restarting apixaban. Initial heparin level still undetectable. No  s/sx of bleeding or infusion issues.   7/15: Hgb 7.7, platelets WNL, no s/sx of bleeding according to nurse  Goal of Therapy:  Heparin level 0.3-0.7 units/ml Monitor platelets by anticoagulation protocol: Yes   Plan:  Initiate 2,000 unit bolus  Increase heparin infusion to 1900 units/hr  Heparin level in 8 hours Daily heparin level and CBC  If tolerates anticoagulation - follow up restart oral AC  Sandford Craze, PharmD. [facility] PGY-1 04/14/2022 7:23 PM    [facility] pharmacy phone numbers are listed on [email or web address]

## 2022-04-14 NOTE — Progress Notes (Signed)
Patient failed wean.  Placed back in full support.  Upon arrival patient SPO2 83%.  Patient SPO2 recovered to 96% after placed back in full support.

## 2022-04-14 NOTE — Progress Notes (Signed)
[facility] Progress Note Patient Name: Chelsea [name] DOB: 1965-11-06 MRN: [id number]   Date of Service  04/14/2022  HPI/Events of Note  BP 84/51, MAP 61, CVP 3-5.  eICU Interventions  LR 500 ml iv bolus x 1 ordered.        Kerry Kass Thaniel Coluccio 04/14/2022, 4:21 AM

## 2022-04-14 NOTE — Progress Notes (Signed)
ANTICOAGULATION CONSULT NOTE  Pharmacy Consult for heparin Indication: atrial fibrillation  Allergies  Allergen Reactions   Penicillins Anaphylaxis    Patient tolerates cefepime (08/2017)   Iodinated Contrast Media    Minoxidil     Other reaction(s): hives    Patient Measurements: Height: '5\' 5"'$  (165.1 cm) Weight: (!) 183.7 kg (405 lb) IBW/kg (Calculated) : 57 Heparin Dosing Weight: 100kg  Vital Signs: Temp: 97.9 F (36.6 C) (07/15 0828) Temp Source: Axillary (07/15 0828) BP: 108/69 (07/15 0757) Pulse Rate: 76 (07/15 0757)  Labs: Recent Labs    04/12/22 0347 04/12/22 0909 04/12/22 2335 04/13/22 0421 04/13/22 1402 04/13/22 2322 04/14/22 0841  HGB 6.8*   < > 7.2* 7.6*  --   --  7.7*  HCT 22.3*   < > 23.9* 26.0*  --   --  26.6*  PLT 193  --   --  170  --   --  182  HEPARINUNFRC  --   --   --  <0.10* <0.10* <0.10* <0.10*  CREATININE 0.44  --   --  0.46  --   --  0.39*   < > = values in this interval not displayed.     Estimated Creatinine Clearance: 130.3 mL/min (A) (by C-G formula based on SCr of 0.39 mg/dL (L)).   Medical History: Past Medical History:  Diagnosis Date   Aortic stenosis    Echocardiogram 02/21/11:  Mean gradient 23 mm of mercury; peak gradient 36;; Turbulence noted in the area of the aortic isthmus with peak gradient 23 mmHg-consider MRI to assess for coarctation;    TEE: 04/02/11:  EF 55-60%, mod BAE, trileaflet AV with mild AS (pk and mean 25 and 16), mild AI, mild MR, mild TR, atrial septal aneurysm, no evidence of corarctation   Arthritis    "qwhere" (12/05/2017)   Asthma    Cervical cancer ([place]) 2006   CHF (congestive heart failure) (HCC)    Chronic lower back pain    Complication of anesthesia    "I have a hard time waking up from under it" (12/05/2017)   COPD (chronic obstructive pulmonary disease) ([place])    "lung dr said I don't have this" (12/05/2017)   Coronary artery disease    Diastolic congestive heart failure ([place])    Echo 02/21/11: EF  55-60%, moderate LVH, grade 2 diastolic dysfunction, mild to moderate aortic stenosis with a mean gradient 23 mm of mercury   GERD (gastroesophageal reflux disease)    Heart murmur    Hepatitis C    History of gout X 1   Hyperlipidemia    Hypertension    Hypotension arterial 04/07/2022   Hypothyroid    IBS (irritable bowel syndrome)    Migraine    "monthly" (12/05/2017)   Obesity hypoventilation syndrome ([place])    On home oxygen therapy    "2L all the time" (12/05/2017)   OSA treated with BiPAP    "have CPAP at home too; wearing BiPAP right now" (12/05/2017)   Pneumonia    "several times" (12/05/2017)   Type II diabetes mellitus (HCC)       Assessment: 56yof with history Afib on apixaban PTA.  S/p bleeding at trach site and need for reversal with K-centra.  Bleeding resolved now stable off pressors.    Plan to see how patient tolerates anticoagulation prior to restarting apixaban.  Initial heparin level still undetectable. No s/sx of bleeding or infusion issues.   Goal of Therapy:  Heparin level 0.3-0.7 units/ml Monitor  platelets by anticoagulation protocol: Yes   Plan:  Increase heparin infusion to 1700 units/hr after bolus of 4000 units. Heparin level in 8 hours Daily heparin level and CBC  If tolerates anticoagulation - follow up restart oral AC  [place] Crane, Roylene Reason, [facility] Clinical Pharmacist  04/14/2022 10:23 AM   [facility] pharmacy phone numbers are listed on [email or web address]

## 2022-04-14 NOTE — Progress Notes (Signed)
NAME:  Chelsea [name], MRN:  [id number], DOB:  Sep 17, 1966, LOS: 7 ADMISSION DATE:  04/07/2022, CONSULTATION DATE:  04/07/2022 REFERRING MD:  Dr. Francia Greaves, CHIEF COMPLAINT:  Bleeding trach    History of Present Illness:  Chelsea [name] is a 56 y.o. female who resides at [facility] with history of CHF with CAD, COPD, OSA, atrial fibrillation HLD, HTN, diabetes, and chronic back pain who presented to the ED from LTAC due to bleeding tracheostomy.  Per family tracheostomy was performed last week at [place].  Daughter reports patient has had significant forceful cough that began overnight and into today which she believes has led to trach bleeding.  Per family patient has not progressed to trach collar trials yet at [place].  On ED arrival patient was seen with mild tachycardia and eventual development of mild hypotension but remained alert and oriented.  Lab work on admission significant for chloride 88, glucose 384, BUN 29, albumin 2.8, AST 10, hemoglobin 8.0, INR 1.7.  ENT was consulted as well as PCCM for management of profusely bleeding tracheostomy.  ENT was able to pack the trach in the emergency department with control and bleeding.  And will admit and monitor in ICU for continued need of ventilator support  Pertinent  Medical History   CHF with CAD, COPD, atrial fibrillation OSA, HLD, HTN, diabetes, and chronic back pain  Significant Hospital Events: Including procedures, antibiotic start and stop dates in addition to other pertinent events   7/8 presented from [facility] with profusely bleeding tracheostomya 7/10 OR for ligation of bleeding vessel 7/11 sedated on mech vent; bleeding improved from trach; received 2 units prbcs; on 15 mcg levo and vaso 0.03 7/12: restarted heparin; off levo 7/13: black stools appreciated; ifob positive; hgb 6.8 got transfused, 7/14 back on full dose heparin   Interim History / Subjective:  Chelsea [name] denies complaints today.  No bleeding  overnight with heparin resumed.   Objective   Blood pressure 108/69, pulse 76, temperature 97.9 F (36.6 C), temperature source Axillary, resp. rate (!) 23, height '5\' 5"'$  (1.651 m), weight (!) 183.7 kg, SpO2 91 %.    Vent Mode: PRVC FiO2 (%):  [50 %-60 %] 50 % Set Rate:  [22 bmp] 22 bmp Vt Set:  [450 mL] 450 mL PEEP:  [8 cmH20] 8 cmH20 Plateau Pressure:  [22 cmH20-27 cmH20] 24 cmH20   Intake/Output Summary (Last 24 hours) at 04/14/2022 1015 Last data filed at 04/14/2022 0836 Gross per 24 hour  Intake 2126.06 ml  Output 1300 ml  Net 826.06 ml    Filed Weights   04/12/22 0452 04/13/22 0500 04/14/22 0500  Weight: (!) 177.4 kg (!) 187.3 kg (!) 183.7 kg    Examination: General: Chronically ill-appearing woman lying in bed in no acute distress watching TV HEENT: [place]/AT, eyes anicteric Pulmonary: Distant breath sounds, rhonchi bilaterally.  Comfortably breathing on pressure support 8, PEEP 8 Cardiac: S1-S2, regular rate and rhythm. Abdomen:, Soft, nontender Extremities: Chronic stasis dermatitis bilateral lower extremities, getting wrinkles in her feet, chronic brawny edema Neuro: Awake and alert, trying to talk.  Writing to communicate.  Moving all extremities but globally weak   H/H 7.7/6.6 BUN 15 Cr 0.39  Resolved Hospital Problem list   Shock (resolved) Sepsis resolved  Assessment & Plan:   Principal Problem:   Bleeding of the respiratory tract Active Problems:   Obstructive sleep apnea   GERD   Hypothyroidism   Chronic diastolic heart failure (HCC)   Osteoarthritis   Obesity  hypoventilation syndrome (HCC)   History of cervical cancer   Neuropathic pain, leg   Hepatitis C   Chronic respiratory failure with hypoxia (HCC)   Chronic pain of multiple joints   Depression   Mixed hyperlipidemia   Neuropathy due to type 2 diabetes mellitus (HCC)   Physical debility   Acute on chronic respiratory failure with hypoxia and hypercapnia (HCC)   Anemia   Physical  deconditioning   Restrictive lung disease secondary to obesity   Venous insufficiency of both lower extremities   Class 3 severe obesity due to excess calories with serious comorbidity and body mass index (BMI) of 60.0 to 69.9 in adult [facility])   DJD (degenerative joint disease)  Hemoptysis due to bleeding from tracheostomy site -ENT ligated 7/10-appreciate their assistance -Continue heparin, needs to be therapeutic for 24 hours prior to transitioning to [place].  Obstructive sleep apnea with OHS  Chronic hypoxic respiratory failure now status post percutaneous tracheostomy with vent dependence -LTVV - VAP prevention protocol - PAD protocol for sedation - Continue vent weaning efforts.  Has to fail 3 days of vent weaning to qualify for LTAC again.  On pressure support trial of 8 today. -Monitor off antibiotics, cefepime DC 7/14   Enterococcus Faecalis in resp culture, suspect colonizer  -Monitor off antibiotics  Acute blood loss anemia, anemia of chronic disease possible GI bleed versus swallowed blood from tracheal bleeding -Continue PPI twice daily> hopefully can de-escalate this prior to discharge for once she is back at [facility] - Continue heparin -Monitor hemoglobin, transfuse for hemoglobin less than 7 or hemodynamically significant bleeding.  History of diastolic congestive heart failure Chronic atrial fibrillation anticoagulated with Eliquis History of hypertension/hyperlipidemia Moderate aortic stenosis -Holding antihypertensives while requiring midodrine -Outpatient follow-up of aortic stenosis  Type 2 diabetes -SSI -Insulin glargine 20 units daily - Goal blood glucose 1 4180  Hypothyroidism  -Synthroid  Morbid obesity -Long-term modest weight loss will be necessary for her health  Chronic hypotension -resume PTA midodrine dosing, '10mg'$  Q6h  At risk for malnutrition - Tube feeds  Urinary retention, present on admission - Continue Foley  catheter  Deconditioning - PT, OT  Best Practice (right click and "Reselect all SmartList Selections" daily)   Diet/type: tubefeeds  DVT prophylaxis: systemic heparin GI prophylaxis: PPI Lines: Central line Foley:  Yes, and it is still needed- chronic foley Code Status:  full code Last date of multidisciplinary goals of care discussion: 7/12 [place] updated over phone   This patient is critically ill with multiple organ system failure which requires frequent high complexity decision making, assessment, support, evaluation, and titration of therapies. This was completed through the application of advanced monitoring technologies and extensive interpretation of multiple databases. During this encounter critical care time was devoted to patient care services described in this note for 36 minutes.  Julian Hy, DO 04/14/22 11:16 AM [place] [facility]

## 2022-04-14 NOTE — Progress Notes (Signed)
ANTICOAGULATION CONSULT NOTE  Pharmacy Consult for heparin Indication: atrial fibrillation  Allergies  Allergen Reactions   Penicillins Anaphylaxis    Patient tolerates cefepime (08/2017)   Iodinated Contrast Media    Minoxidil     Other reaction(s): hives    Patient Measurements: Height: '5\' 5"'$  (165.1 cm) Weight: (!) 187.3 kg (412 lb 14.8 oz) IBW/kg (Calculated) : 57 Heparin Dosing Weight: 100kg  Vital Signs: Temp: 99.3 F (37.4 C) (07/14 2355) Temp Source: Axillary (07/14 1955) BP: 92/58 (07/15 0000) Pulse Rate: 69 (07/15 0000)  Labs: Recent Labs    04/11/22 0320 04/11/22 1738 04/12/22 0347 04/12/22 0909 04/12/22 2335 04/13/22 0421 04/13/22 1402 04/13/22 2322  HGB 7.3*  --  6.8* 7.4* 7.2* 7.6*  --   --   HCT 24.2*  --  22.3* 24.2* 23.9* 26.0*  --   --   PLT 230  --  193  --   --  170  --   --   HEPARINUNFRC  --    < >  --   --   --  <0.10* <0.10* <0.10*  CREATININE 0.47  --  0.44  --   --  0.46  --   --    < > = values in this interval not displayed.     Estimated Creatinine Clearance: 132 mL/min (by C-G formula based on SCr of 0.46 mg/dL).   Medical History: Past Medical History:  Diagnosis Date   Aortic stenosis    Echocardiogram 02/21/11:  Mean gradient 23 mm of mercury; peak gradient 36;; Turbulence noted in the area of the aortic isthmus with peak gradient 23 mmHg-consider MRI to assess for coarctation;    TEE: 04/02/11:  EF 55-60%, mod BAE, trileaflet AV with mild AS (pk and mean 25 and 16), mild AI, mild MR, mild TR, atrial septal aneurysm, no evidence of corarctation   Arthritis    "qwhere" (12/05/2017)   Asthma    Cervical cancer ([place]) 2006   CHF (congestive heart failure) (HCC)    Chronic lower back pain    Complication of anesthesia    "I have a hard time waking up from under it" (12/05/2017)   COPD (chronic obstructive pulmonary disease) ([place])    "lung dr said I don't have this" (12/05/2017)   Coronary artery disease    Diastolic congestive heart  failure ([place])    Echo 02/21/11: EF 55-60%, moderate LVH, grade 2 diastolic dysfunction, mild to moderate aortic stenosis with a mean gradient 23 mm of mercury   GERD (gastroesophageal reflux disease)    Heart murmur    Hepatitis C    History of gout X 1   Hyperlipidemia    Hypertension    Hypotension arterial 04/07/2022   Hypothyroid    IBS (irritable bowel syndrome)    Migraine    "monthly" (12/05/2017)   Obesity hypoventilation syndrome ([place])    On home oxygen therapy    "2L all the time" (12/05/2017)   OSA treated with BiPAP    "have CPAP at home too; wearing BiPAP right now" (12/05/2017)   Pneumonia    "several times" (12/05/2017)   Type II diabetes mellitus (HCC)       Assessment: Chelsea [name] with history Afib on apixaban PTA.  S/p bleeding at trach site and need for reversal with K-centra.  Bleeding resolved now stable off pressors.    Plan to see how patient tolerates anticoagulation prior to restarting apixaban.  Initial heparin level came back subtherapeutic at 0.16, on  1200 units/hr. No s/sx of bleeding or infusion issues.   7/15 AM update:  Heparin level low Titrating slowly   Goal of Therapy:  Heparin level 0.3-0.7 units/ml Monitor platelets by anticoagulation protocol: Yes   Plan:  Increase heparin infusion to 1550 units/hr - no bolus with recent bleeding Heparin level in 8 hours Daily heparin level and CBC  If tolerates anticoagulation - follow up restart oral AC  Narda Bonds, PharmD, [place] Pharmacist Phone: [phone]

## 2022-04-15 ENCOUNTER — Inpatient Hospital Stay (HOSPITAL_COMMUNITY): Payer: Medicare Other

## 2022-04-15 DIAGNOSIS — Z9911 Dependence on respirator [ventilator] status: Secondary | ICD-10-CM | POA: Diagnosis not present

## 2022-04-15 DIAGNOSIS — J9621 Acute and chronic respiratory failure with hypoxia: Secondary | ICD-10-CM | POA: Diagnosis not present

## 2022-04-15 DIAGNOSIS — R049 Hemorrhage from respiratory passages, unspecified: Secondary | ICD-10-CM | POA: Diagnosis not present

## 2022-04-15 LAB — GLUCOSE, CAPILLARY
Glucose-Capillary: 123 mg/dL — ABNORMAL HIGH (ref 70–99)
Glucose-Capillary: 99 mg/dL (ref 70–99)
Glucose-Capillary: 120 mg/dL — ABNORMAL HIGH (ref 70–99)
Glucose-Capillary: 147 mg/dL — ABNORMAL HIGH (ref 70–99)
Glucose-Capillary: 118 mg/dL — ABNORMAL HIGH (ref 70–99)
Glucose-Capillary: 98 mg/dL (ref 70–99)

## 2022-04-15 LAB — BASIC METABOLIC PANEL
Anion gap: 4 — ABNORMAL LOW (ref 5–15)
Anion gap: 9 (ref 5–15)
BUN: 14 mg/dL (ref 6–20)
BUN: 12 mg/dL (ref 6–20)
CO2: 32 mmol/L (ref 22–32)
CO2: 38 mmol/L — ABNORMAL HIGH (ref 22–32)
Calcium: 7.7 mg/dL — ABNORMAL LOW (ref 8.9–10.3)
Calcium: 8.3 mg/dL — ABNORMAL LOW (ref 8.9–10.3)
Chloride: 103 mmol/L (ref 98–111)
Chloride: 95 mmol/L — ABNORMAL LOW (ref 98–111)
Creatinine, Ser: 0.36 mg/dL — ABNORMAL LOW (ref 0.44–1.00)
Creatinine, Ser: 0.44 mg/dL (ref 0.44–1.00)
GFR, Estimated: >60 mL/min (ref >60)
GFR, Estimated: >60 mL/min (ref >60)
Glucose, Bld: 113 mg/dL — ABNORMAL HIGH (ref 70–99)
Glucose, Bld: 116 mg/dL — ABNORMAL HIGH (ref 70–99)
Potassium: 4.1 mmol/L (ref 3.5–5.1)
Potassium: 3.8 mmol/L (ref 3.5–5.1)
Sodium: 139 mmol/L (ref 135–145)
Sodium: 142 mmol/L (ref 135–145)

## 2022-04-15 LAB — CBC
HCT: 26.1 % — ABNORMAL LOW (ref 36.0–46.0)
Hemoglobin: 7.8 g/dL — ABNORMAL LOW (ref 12.0–15.0)
MCH: 27.5 pg (ref 26.0–34.0)
MCHC: 29.9 g/dL — ABNORMAL LOW (ref 30.0–36.0)
MCV: 91.9 fL (ref 80.0–100.0)
Platelets: 184 10*3/uL (ref 150–400)
RBC: 2.84 MIL/uL — ABNORMAL LOW (ref 3.87–5.11)
RDW: 16.6 % — ABNORMAL HIGH (ref 11.5–15.5)
WBC: 4.5 10*3/uL (ref 4.0–10.5)
nRBC: 0 % (ref 0.0–0.2)

## 2022-04-15 LAB — CULTURE, BLOOD (ROUTINE X 2)
Culture: NO GROWTH
Special Requests: ADEQUATE

## 2022-04-15 LAB — HEPARIN LEVEL (UNFRACTIONATED)
Heparin Unfractionated: 0.18 IU/mL — ABNORMAL LOW (ref 0.30–0.70)
Heparin Unfractionated: 0.25 IU/mL — ABNORMAL LOW (ref 0.30–0.70)

## 2022-04-15 LAB — MAGNESIUM: Magnesium: 1.9 mg/dL (ref 1.7–2.4)

## 2022-04-15 MED ORDER — FUROSEMIDE 10 MG/ML IJ SOLN
60.0000 mg | Freq: Two times a day (BID) | INTRAMUSCULAR | Status: AC
  Administered 2022-04-15 (×2): 60 mg via INTRAVENOUS
  Filled 2022-04-15 (×2): qty 6

## 2022-04-15 NOTE — Progress Notes (Signed)
NAME:  Chelsea [name], MRN:  [id number], DOB:  [birth year], LOS: 8 ADMISSION DATE:  04/07/2022, CONSULTATION DATE:  04/07/2022 REFERRING MD:  Dr. Francia Greaves, CHIEF COMPLAINT:  Bleeding trach    History of Present Illness:  Chelsea [name] is a 56 y.o. female who resides at [facility] with history of CHF with CAD, COPD, OSA, atrial fibrillation HLD, HTN, diabetes, and chronic back pain who presented to the ED from LTAC due to bleeding tracheostomy.  Per family tracheostomy was performed last week at [place].  Daughter reports patient has had significant forceful cough that began overnight and into today which she believes has led to trach bleeding.  Per family patient has not progressed to trach collar trials yet at [place].  On ED arrival patient was seen with mild tachycardia and eventual development of mild hypotension but remained alert and oriented.  Lab work on admission significant for chloride 88, glucose 384, BUN 29, albumin 2.8, AST 10, hemoglobin 8.0, INR 1.7.  ENT was consulted as well as PCCM for management of profusely bleeding tracheostomy.  ENT was able to pack the trach in the emergency department with control and bleeding.  And will admit and monitor in ICU for continued need of ventilator support  Pertinent  Medical History   CHF with CAD, COPD, atrial fibrillation OSA, HLD, HTN, diabetes, and chronic back pain  Significant Hospital Events: Including procedures, antibiotic start and stop dates in addition to other pertinent events   7/8 presented from [facility] with profusely bleeding tracheostomya 7/10 OR for ligation of bleeding vessel 7/11 sedated on mech vent; bleeding improved from trach; received 2 units prbcs; on 15 mcg levo and vaso 0.03 7/12: restarted heparin; off levo 7/13: black stools appreciated; ifob positive; hgb 6.8 got transfused, 7/14 back on full dose heparin   Interim History / Subjective:  No new complaints. Some coughing and sputum production.  No recurrent hemoptysis or significant GIB.  Objective   Blood pressure (!) 104/55, pulse 66, temperature 97.8 F (36.6 C), temperature source Oral, resp. rate (!) 23, height '5\' 5"'$  (1.651 m), weight (!) 186 kg, SpO2 94 %.    Vent Mode: PSV;CPAP FiO2 (%):  [40 %-50 %] 50 % Set Rate:  [22 bmp] 22 bmp Vt Set:  [450 mL] 450 mL PEEP:  [8 cmH20] 8 cmH20 Pressure Support:  [8 cmH20-14 cmH20] 14 cmH20 Plateau Pressure:  [13 cmH20-24 cmH20] 13 cmH20   Intake/Output Summary (Last 24 hours) at 04/15/2022 1044 Last data filed at 04/15/2022 1000 Gross per 24 hour  Intake 2191.95 ml  Output 1720 ml  Net 471.95 ml    Filed Weights   04/13/22 0500 04/14/22 0500 04/15/22 0400  Weight: (!) 187.3 kg (!) 183.7 kg (!) 186 kg    Examination: General: chronically ill appearing woman lying in bed watching TV in NAD HEENT: [place]/AT, eyes anicteric. Cortrak in place. Neck : trach in place, no bleeding Pulmonary: rhonchi bilaterally, breathing comfortably on PS 14.  Thick secretions in suction tubing. Cardiac: S1S2, RRR Abdomen: obese, soft, NT Extremities: BLE stasis dermatitis, wrinkles in her feet, ongoing chronic brawn edema. Neuro: awake, alert, trying to talk, moving all extremities   H/H 7.8/26.1 BUN 14 Cr 0.36  Resolved Hospital Problem list   Shock (resolved) Sepsis resolved  Assessment & Plan:   Principal Problem:   Bleeding of the respiratory tract Active Problems:   Obstructive sleep apnea   GERD   Hypothyroidism   Chronic diastolic heart failure (HCC)  Osteoarthritis   Obesity hypoventilation syndrome (HCC)   History of cervical cancer   Neuropathic pain, leg   Hepatitis C   Chronic respiratory failure with hypoxia (HCC)   Chronic pain of multiple joints   Depression   Mixed hyperlipidemia   Neuropathy due to type 2 diabetes mellitus (HCC)   Physical debility   Acute on chronic respiratory failure with hypoxia and hypercapnia (HCC)   Anemia   Physical  deconditioning   Restrictive lung disease secondary to obesity   Venous insufficiency of both lower extremities   Class 3 severe obesity due to excess calories with serious comorbidity and body mass index (BMI) of 60.0 to 69.9 in adult (HCC)   DJD (degenerative joint disease)  Hemoptysis due to bleeding from tracheostomy site -ENT ligated 7/10-appreciate their assistance -con't heparin, hopefully can resume eliquis tomorrow  Obstructive sleep apnea with OHS  Chronic hypoxic respiratory failure now status post percutaneous tracheostomy with vent dependence -LTVV -Con't vent weaning efforts-- so far on more PS today than yesterday. Not ready for ATC trials. Will need LTAC for prolonged vent wean. - VAP prevention protocol -monitor off antibiotics -trach aspirate culture; prev d/c antibiotics on 7/14  Enterococcus Faecalis in resp culture, suspect colonizer  -Monitor off antibiotics, repeat culture today  Acute blood loss anemia, anemia of chronic disease possible GI bleed versus swallowed blood from tracheal bleeding -Con't PPI BID; hopefully can titrate this off when back on full AC or once she is back at [facility]. - Continue AC-heparin -monitor; transfuse for Hb <7 or hemodynamically significant bleeding  History of diastolic congestive heart failure Chronic atrial fibrillation anticoagulated with Eliquis History of hypertension/hyperlipidemia Moderate aortic stenosis -con't midodrine and holding anti hypertensives -Outpatient follow-up of aortic stenosis  Type 2 diabetes -SSI PRN -glargine 20 units daily -TF coverage- 10 units Q4h -goal BG 140-180  Hypothyroidism  -con't synthroid  Morbid obesity -Long-term modest weight loss will be necessary for her health  Chronic hypotension -resume PTA midodrine dosing 10 mg Q6h  At risk for malnutrition - Tube feeds  Urinary retention, present on admission - Continue Foley catheter  Deconditioning - PT, OT  Best  Practice (right click and "Reselect all SmartList Selections" daily)   Diet/type: tubefeeds  DVT prophylaxis: systemic heparin GI prophylaxis: PPI Lines: Central line Foley:  Yes, and it is still needed- chronic foley Code Status:  full code Last date of multidisciplinary goals of care discussion: 7/12 Dewayne updated over phone   This patient is critically ill with multiple organ system failure which requires frequent high complexity decision making, assessment, support, evaluation, and titration of therapies. This was completed through the application of advanced monitoring technologies and extensive interpretation of multiple databases. During this encounter critical care time was devoted to patient care services described in this note for 34 minutes.  Julian Hy, DO 04/15/22 4:12 PM  Pulmonary & Critical Care

## 2022-04-15 NOTE — Evaluation (Signed)
Physical Therapy Evaluation Patient Details Name: Chelsea [name] MRN: [id number] DOB: 06/12/1966 Today's Date: 04/15/2022  History of Present Illness  The pt is a 56 yo female presenting 7/8 from [facility] due to bleeding from tracheostomy. Eventually underwent surgical cauterization of wound on 7/10 to control bleeding. PMH includes: chronic respiratory failure with hypoxia (ventilator dependent), CHF, CAD, COPD, OSA on CPAP, afib, HLD, HTN, DM II, morbid obesity, and chronic back pain.   Clinical Impression  Pt in bed upon arrival of PT, agreeable to evaluation at this time. Prior to admission the pt was dependent with all mobility at [facility], but reports she had been able to roll and sit EOB during last hospital admission prior to being d/c to [facility] ~2 weeks PTA. The pt now presents with limitations in functional mobility, strength, ROM, activity tolerance, and endurance due to above dx, and will continue to benefit from skilled PT to address these deficits. The pt was able to demo good ROM with UE against gravity, but is limited in LE strength and ROM against gravity and demos poor functional core strength and coordination to complete rolling or bed mobility without maxA at this time. The pt will continue to benefit from skilled PT acutely to progress functional strength and activity tolerance for bed mobility and potential transfers, but recommend return to [facility] when medically stable for d/c.         Recommendations for follow up therapy are one component of a multi-disciplinary discharge planning process, led by the attending physician.  Recommendations may be updated based on patient status, additional functional criteria and insurance authorization.  Follow Up Recommendations PT at Long-term acute care hospital      Assistance Recommended at Discharge Frequent or constant Supervision/Assistance  Patient can return home with the following  Two people to help with walking and/or  transfers;Two people to help with bathing/dressing/bathroom;Assistance with cooking/housework;Assistance with feeding;Direct supervision/assist for medications management;Direct supervision/assist for financial management;Assist for transportation;Help with stairs or ramp for entrance    Equipment Recommendations None recommended by PT  Recommendations for Other Services       Functional Status Assessment Patient has had a recent decline in their functional status and demonstrates the ability to make significant improvements in function in a reasonable and predictable amount of time.     Precautions / Restrictions Precautions Precautions: Fall Precaution Comments: trach vent, sacral wounds Restrictions Weight Bearing Restrictions: No      Mobility  Bed Mobility Overal bed mobility: Needs Assistance Bed Mobility: Rolling Rolling: Max assist         General bed mobility comments: pt able to complete ~25% without assist, maxA to complete roll to each side with better rolling ability to L compared to R.        Pertinent Vitals/Pain Pain Assessment Pain Assessment: Faces Faces Pain Scale: Hurts little more Pain Location: "everywhere", legs to touch Pain Descriptors / Indicators: Discomfort Pain Intervention(s): Limited activity within patient's tolerance, Monitored during session, Repositioned    Home Living Family/patient expects to be discharged to:: Skilled nursing facility                   Additional Comments: from [facility]    Prior Function Prior Level of Function : Needs assist             Mobility Comments: dependent on staff at [facility], mostly bed bound, states she had been sitting EOB at last hospital. walked 15 ft with therapy here  in 2019. ADLs Comments: pt dependent with bathing, able to perform some self care such as brushing hair     Hand Dominance        Extremity/Trunk Assessment   Upper Extremity Assessment Upper  Extremity Assessment: Generalized weakness    Lower Extremity Assessment Lower Extremity Assessment: Generalized weakness (pt reports diminished sensation on bilateral feet, worse ROM with RLE compared to LLE. all limited by body habitus)    Cervical / Trunk Assessment Cervical / Trunk Assessment: Other exceptions Cervical / Trunk Exceptions: morbid obesity  Communication   Communication: Tracheostomy  Cognition Arousal/Alertness: Awake/alert Behavior During Therapy: WFL for tasks assessed/performed Overall Cognitive Status: Within Functional Limits for tasks assessed                                          General Comments General comments (skin integrity, edema, etc.): VSS on trach vent    Exercises General Exercises - Lower Extremity Ankle Circles/Pumps: AROM, Both, 10 reps, Supine Heel Slides: AROM, Both, 5 reps, Supine Hip ABduction/ADduction: AROM, Both, 5 reps, Supine Other Exercises Other Exercises: scapular retraction with BUE support on bed rail, x5 with modA   Assessment/Plan    PT Assessment Patient needs continued PT services  PT Problem List Decreased strength;Decreased range of motion;Decreased activity tolerance;Decreased balance;Decreased mobility       PT Treatment Interventions DME instruction;Gait training;Stair training;Functional mobility training;Therapeutic activities;Therapeutic exercise;Balance training;Patient/family education    PT Goals (Current goals can be found in the Care Plan section)  Acute Rehab PT Goals Patient Stated Goal: improve strength PT Goal Formulation: With patient Time For Goal Achievement: 04/29/22 Potential to Achieve Goals: Fair    Frequency Min 2X/week        AM-PAC PT "6 Clicks" Mobility  Outcome Measure Help needed turning from your back to your side while in a flat bed without using bedrails?: Total Help needed moving from lying on your back to sitting on the side of a flat bed without using  bedrails?: Total Help needed moving to and from a bed to a chair (including a wheelchair)?: Total Help needed standing up from a chair using your arms (e.g., wheelchair or bedside chair)?: Total Help needed to walk in hospital room?: Total Help needed climbing 3-5 steps with a railing? : Total 6 Click Score: 6    End of Session Equipment Utilized During Treatment: Oxygen Activity Tolerance: Patient limited by fatigue Patient left: in bed;with call bell/phone within reach Nurse Communication: Mobility status PT Visit Diagnosis: Other abnormalities of gait and mobility (R26.89);Muscle weakness (generalized) (M62.81)    Time: 2778-2423 PT Time Calculation (min) (ACUTE ONLY): 23 min   Charges:   PT Evaluation $PT Eval Low Complexity: 1 Low PT Treatments $Therapeutic Exercise: 8-22 mins        West Carbo, PT, DPT   Acute Rehabilitation Department  Sandra Cockayne 04/15/2022, 5:36 PM

## 2022-04-15 NOTE — Progress Notes (Signed)
Attempted to get sputum sample per MD order but unable to obtain enough with 2 tracheal suctions. Suction Cup left in line and RN made aware. Will attempt to collect at next suction.

## 2022-04-15 NOTE — Progress Notes (Signed)
[facility] Progress Note Patient Name: Chelsea [name] DOB: [birth year] MRN: [id number]   Date of Service  04/15/2022  HPI/Events of Note  RN concerned with CVP 16, UO of 455, sounds wet, asking for CXR  on vent  No desaturation reported  eICU Interventions  CXR ordered     Intervention Category Intermediate Interventions: Other:  Judd Lien 04/15/2022, 4:51 AM

## 2022-04-15 NOTE — Progress Notes (Signed)
ANTICOAGULATION CONSULT NOTE  Pharmacy Consult for heparin Indication: atrial fibrillation  Allergies  Allergen Reactions   Penicillins Anaphylaxis    Patient tolerates cefepime (08/2017)   Iodinated Contrast Media    Minoxidil     Other reaction(s): hives    Patient Measurements: Height: '5\' 5"'$  (165.1 cm) Weight: (!) 186 kg (410 lb) IBW/kg (Calculated) : 57 Heparin Dosing Weight: 100kg  Vital Signs: Temp: 97.6 F (36.4 C) (07/16 0357) Temp Source: Axillary (07/16 0357) BP: 108/57 (07/16 0600) Pulse Rate: 66 (07/16 0600)  Labs: Recent Labs    04/13/22 0421 04/13/22 1402 04/14/22 0841 04/14/22 1700 04/15/22 0410 04/15/22 0500  HGB 7.6*  --  7.7*  --  7.8*  --   HCT 26.0*  --  26.6*  --  26.1*  --   PLT 170  --  182  --  184  --   HEPARINUNFRC <0.10*   < > <0.10* <0.10*  --  0.18*  CREATININE 0.46  --  0.39*  --  0.36*  --    < > = values in this interval not displayed.     Estimated Creatinine Clearance: 131.4 mL/min (A) (by C-G formula based on SCr of 0.36 mg/dL (L)).   Medical History: Past Medical History:  Diagnosis Date   Aortic stenosis    Echocardiogram 02/21/11:  Mean gradient 23 mm of mercury; peak gradient 36;; Turbulence noted in the area of the aortic isthmus with peak gradient 23 mmHg-consider MRI to assess for coarctation;    TEE: 04/02/11:  EF 55-60%, mod BAE, trileaflet AV with mild AS (pk and mean 25 and 16), mild AI, mild MR, mild TR, atrial septal aneurysm, no evidence of corarctation   Arthritis    "qwhere" (12/05/2017)   Asthma    Cervical cancer ([place]) 2006   CHF (congestive heart failure) (HCC)    Chronic lower back pain    Complication of anesthesia    "I have a hard time waking up from under it" (12/05/2017)   COPD (chronic obstructive pulmonary disease) ([place])    "lung dr said I don't have this" (12/05/2017)   Coronary artery disease    Diastolic congestive heart failure ([place])    Echo 02/21/11: EF 55-60%, moderate LVH, grade 2 diastolic  dysfunction, mild to moderate aortic stenosis with a mean gradient 23 mm of mercury   GERD (gastroesophageal reflux disease)    Heart murmur    Hepatitis C    History of gout X 1   Hyperlipidemia    Hypertension    Hypotension arterial 04/07/2022   Hypothyroid    IBS (irritable bowel syndrome)    Migraine    "monthly" (12/05/2017)   Obesity hypoventilation syndrome ([place])    On home oxygen therapy    "2L all the time" (12/05/2017)   OSA treated with BiPAP    "have CPAP at home too; wearing BiPAP right now" (12/05/2017)   Pneumonia    "several times" (12/05/2017)   Type II diabetes mellitus (HCC)       Assessment: 56yof with history Afib on apixaban PTA.  S/p bleeding at trach site and need for reversal with K-centra.  Bleeding resolved now stable off pressors.    Plan to see how patient tolerates anticoagulation prior to restarting apixaban.  Initial heparin level came back subtherapeutic at 0.16, on 1200 units/hr. No s/sx of bleeding or infusion issues.   7/16 AM update:  Heparin level low but trending up  Goal of Therapy:  Heparin level  0.3-0.7 units/ml Monitor platelets by anticoagulation protocol: Yes   Plan:  Increase heparin infusion to 2100 units/hr Heparin level in 8 hours Daily heparin level and CBC  If tolerates anticoagulation - follow up restart oral AC  Narda Bonds, PharmD, [facility] Pharmacist Phone: [phone]

## 2022-04-15 NOTE — Progress Notes (Signed)
ANTICOAGULATION CONSULT NOTE  Pharmacy Consult for heparin Indication: atrial fibrillation  Allergies  Allergen Reactions   Penicillins Anaphylaxis    Patient tolerates cefepime (08/2017)   Iodinated Contrast Media    Minoxidil     Other reaction(s): hives    Patient Measurements: Height: '5\' 5"'$  (165.1 cm) Weight: (!) 186 kg (410 lb) IBW/kg (Calculated) : 57 Heparin Dosing Weight: 100kg  Vital Signs: Temp: 97.8 F (36.6 C) (07/16 1554) Temp Source: Axillary (07/16 1554) BP: 118/55 (07/16 1500) Pulse Rate: 72 (07/16 1500)  Labs: Recent Labs    04/13/22 0421 04/13/22 1402 04/14/22 0841 04/14/22 1700 04/15/22 0410 04/15/22 0500 04/15/22 1523  HGB 7.6*  --  7.7*  --  7.8*  --   --   HCT 26.0*  --  26.6*  --  26.1*  --   --   PLT 170  --  182  --  184  --   --   HEPARINUNFRC <0.10*   < > <0.10* <0.10*  --  0.18* 0.25*  CREATININE 0.46  --  0.39*  --  0.36*  --   --    < > = values in this interval not displayed.     Estimated Creatinine Clearance: 131.4 mL/min (A) (by C-G formula based on SCr of 0.36 mg/dL (L)).   Medical History: Past Medical History:  Diagnosis Date   Aortic stenosis    Echocardiogram 02/21/11:  Mean gradient 23 mm of mercury; peak gradient 36;; Turbulence noted in the area of the aortic isthmus with peak gradient 23 mmHg-consider MRI to assess for coarctation;    TEE: 04/02/11:  EF 55-60%, mod BAE, trileaflet AV with mild AS (pk and mean 25 and 16), mild AI, mild MR, mild TR, atrial septal aneurysm, no evidence of corarctation   Arthritis    "qwhere" (12/05/2017)   Asthma    Cervical cancer ([place]) 2006   CHF (congestive heart failure) (HCC)    Chronic lower back pain    Complication of anesthesia    "I have a hard time waking up from under it" (12/05/2017)   COPD (chronic obstructive pulmonary disease) ([place])    "lung dr said I don't have this" (12/05/2017)   Coronary artery disease    Diastolic congestive heart failure ([place])    Echo 02/21/11: EF  55-60%, moderate LVH, grade 2 diastolic dysfunction, mild to moderate aortic stenosis with a mean gradient 23 mm of mercury   GERD (gastroesophageal reflux disease)    Heart murmur    Hepatitis C    History of gout X 1   Hyperlipidemia    Hypertension    Hypotension arterial 04/07/2022   Hypothyroid    IBS (irritable bowel syndrome)    Migraine    "monthly" (12/05/2017)   Obesity hypoventilation syndrome ([place])    On home oxygen therapy    "2L all the time" (12/05/2017)   OSA treated with BiPAP    "have CPAP at home too; wearing BiPAP right now" (12/05/2017)   Pneumonia    "several times" (12/05/2017)   Type II diabetes mellitus (HCC)       Assessment: 56yof with history Afib on apixaban PTA.  S/p bleeding at trach site and need for reversal with K-centra.  Bleeding resolved now stable off pressors.    Plan to see how patient tolerates anticoagulation prior to restarting apixaban.  Initial heparin level came back subtherapeutic at 0.16, on 1200 units/hr. No s/sx of bleeding or infusion issues.   7/16 AM  update:  Heparin level low but trending up  7/16: Hgb 7.8, platelets WNL, heparin level 0.25, spoke with the nurse and no s/sx of bleeding and no issues with running the infusion  Goal of Therapy:  Heparin level 0.3-0.7 units/ml Monitor platelets by anticoagulation protocol: Yes   Plan:  Increase heparin infusion to 2300 units/hr Heparin level in 8 hours Daily heparin level and CBC  If tolerates anticoagulation - follow up restart oral AC  Sandford Craze, PharmD. [facility] Acute Care PGY-1  04/15/2022 4:36 PM

## 2022-04-15 NOTE — Progress Notes (Signed)
Rt Note- Patient has been placed on wean with PS +14 and  PEEP +8. Tolerating well at this time, will continue to monitor, RN is aware of wean.

## 2022-04-16 DIAGNOSIS — J9621 Acute and chronic respiratory failure with hypoxia: Secondary | ICD-10-CM | POA: Diagnosis not present

## 2022-04-16 DIAGNOSIS — J9622 Acute and chronic respiratory failure with hypercapnia: Secondary | ICD-10-CM | POA: Diagnosis not present

## 2022-04-16 DIAGNOSIS — Z9911 Dependence on respirator [ventilator] status: Secondary | ICD-10-CM | POA: Diagnosis not present

## 2022-04-16 DIAGNOSIS — R049 Hemorrhage from respiratory passages, unspecified: Secondary | ICD-10-CM | POA: Diagnosis not present

## 2022-04-16 LAB — CBC
HCT: 26.5 % — ABNORMAL LOW (ref 36.0–46.0)
Hemoglobin: 7.9 g/dL — ABNORMAL LOW (ref 12.0–15.0)
MCH: 27.6 pg (ref 26.0–34.0)
MCHC: 29.8 g/dL — ABNORMAL LOW (ref 30.0–36.0)
MCV: 92.7 fL (ref 80.0–100.0)
Platelets: 185 10*3/uL (ref 150–400)
RBC: 2.86 MIL/uL — ABNORMAL LOW (ref 3.87–5.11)
RDW: 16.9 % — ABNORMAL HIGH (ref 11.5–15.5)
WBC: 5.1 10*3/uL (ref 4.0–10.5)
nRBC: 0 % (ref 0.0–0.2)

## 2022-04-16 LAB — GLUCOSE, CAPILLARY
Glucose-Capillary: 136 mg/dL — ABNORMAL HIGH (ref 70–99)
Glucose-Capillary: 131 mg/dL — ABNORMAL HIGH (ref 70–99)
Glucose-Capillary: 97 mg/dL (ref 70–99)
Glucose-Capillary: 104 mg/dL — ABNORMAL HIGH (ref 70–99)
Glucose-Capillary: 130 mg/dL — ABNORMAL HIGH (ref 70–99)
Glucose-Capillary: 158 mg/dL — ABNORMAL HIGH (ref 70–99)

## 2022-04-16 LAB — BASIC METABOLIC PANEL
Anion gap: 6 (ref 5–15)
BUN: 12 mg/dL (ref 6–20)
CO2: 38 mmol/L — ABNORMAL HIGH (ref 22–32)
Calcium: 8 mg/dL — ABNORMAL LOW (ref 8.9–10.3)
Chloride: 95 mmol/L — ABNORMAL LOW (ref 98–111)
Creatinine, Ser: 0.39 mg/dL — ABNORMAL LOW (ref 0.44–1.00)
GFR, Estimated: >60 mL/min (ref >60)
Glucose, Bld: 148 mg/dL — ABNORMAL HIGH (ref 70–99)
Potassium: 3.7 mmol/L (ref 3.5–5.1)
Sodium: 139 mmol/L (ref 135–145)

## 2022-04-16 LAB — HEPARIN LEVEL (UNFRACTIONATED): Heparin Unfractionated: 0.3 IU/mL (ref 0.30–0.70)

## 2022-04-16 MED ORDER — DIPHENHYDRAMINE HCL 25 MG PO CAPS
25.0000 mg | ORAL_CAPSULE | Freq: Four times a day (QID) | ORAL | Status: DC | PRN
  Administered 2022-04-16 – 2022-04-17 (×2): 25 mg via ORAL
  Filled 2022-04-16 (×2): qty 1

## 2022-04-16 MED ORDER — APIXABAN 5 MG PO TABS
5.0000 mg | ORAL_TABLET | Freq: Two times a day (BID) | ORAL | Status: DC
  Administered 2022-04-16 – 2022-04-17 (×3): 5 mg via ORAL
  Filled 2022-04-16 (×4): qty 1

## 2022-04-16 MED ORDER — INSULIN ASPART 100 UNIT/ML IJ SOLN
2.0000 [IU] | INTRAMUSCULAR | Status: DC
  Administered 2022-04-16 – 2022-04-22 (×37): 2 [IU] via SUBCUTANEOUS

## 2022-04-16 MED ORDER — PANTOPRAZOLE 2 MG/ML SUSPENSION
40.0000 mg | Freq: Two times a day (BID) | ORAL | Status: DC
  Administered 2022-04-16 – 2022-04-22 (×12): 40 mg
  Filled 2022-04-16 (×12): qty 20

## 2022-04-16 NOTE — Progress Notes (Signed)
Occupational Therapy Treatment and Discharge Patient Details Name: Chelsea [name] MRN: [id number] DOB: 02-17-1966 Today's Date: 04/16/2022   History of present illness The pt is a 56 yo female presenting 7/8 from [facility] due to bleeding from tracheostomy. Eventually underwent surgical cauterization of wound on 7/10 to control bleeding. PMH includes: chronic respiratory failure with hypoxia (ventilator dependent), CHF, CAD, COPD, OSA on CPAP, afib, HLD, HTN, DM II, morbid obesity, and chronic back pain.   OT comments  Pt seen for theraband exercises while in bed. Pt able to do all exercises on her own. No further OT needs, we will sign off.   Recommendations for follow up therapy are one component of a multi-disciplinary discharge planning process, led by the attending physician.  Recommendations may be updated based on patient status, additional functional criteria and insurance authorization.    Follow Up Recommendations  OT at Long-term acute care hospital    Assistance Recommended at Discharge Frequent or constant Supervision/Assistance  Patient can return home with the following  Two people to help with walking and/or transfers;A lot of help with bathing/dressing/bathroom;Assistance with cooking/housework;Assistance with feeding   Equipment Recommendations  None recommended by OT       Precautions / Restrictions Precautions Precautions: Fall Precaution Comments: trach vent, sacral wounds Restrictions Weight Bearing Restrictions: No              ADL either performed or assessed with clinical judgement   ADL Overall ADL's : Needs assistance/impaired Eating/Feeding: NPO   Grooming: Wash/dry hands;Wash/dry face;Oral care;Applying deodorant;Set up;Bed level Grooming Details (indicate cue type and reason): brushing hair she needs A bed level due to laying back, in sitting per her report she comb/brush her own hair Upper Body Bathing: Set up;Bed level   Lower Body  Bathing: Total assistance;Bed level   Upper Body Dressing : Maximal assistance;Bed level   Lower Body Dressing: Total assistance;Bed level                      Extremity/Trunk Assessment Upper Extremity Assessment Upper Extremity Assessment: RUE deficits/detail;LUE deficits/detail RUE Deficits / Details: Generally weak with strength varying from 3/5-4/5 LUE Deficits / Details: Generally weak with strength varying from 2/5-4/5            Vision Patient Visual Report: No change from baseline            Cognition Arousal/Alertness: Awake/alert Behavior During Therapy: WFL for tasks assessed/performed Overall Cognitive Status: Within Functional Limits for tasks assessed                                          Exercises Other Exercises Other Exercises: Provided pt with red (level 2) theraband tied to both upper bed rails. Pt able to complete 10 reps each of shoulder flexion, horizontal adduction, shoulder internal rotation, and elbow extension (reaching down towards her feet) all at an independent level.            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 Pain Descriptors / Indicators: Discomfort Pain Intervention(s): Limited activity within patient's tolerance, Monitored during session, Repositioned  Home Living Family/patient expects to be discharged to:: Skilled nursing facility  Additional Comments: from [place] and return to [facility]          Frequency  Min 2X/week        Progress Toward Goals  OT Goals(current goals can now be found in the care plan section)  Progress towards OT goals: Goals met/education completed, patient discharged from OT  Acute Rehab OT Goals Patient Stated Goal: to  be able to do arm exercises while she is here OT Goal Formulation: With patient Time For Goal Achievement: 04/30/22 Potential to Achieve Goals: Good  Plan         AM-PAC OT "6 Clicks" Daily Activity     Outcome Measure   Help from another person eating meals?: A Little Help from another person taking care of personal grooming?: A Little Help from another person toileting, which includes using toliet, bedpan, or urinal?: Total Help from another person bathing (including washing, rinsing, drying)?: A Little Help from another person to put on and taking off regular upper body clothing?: Total Help from another person to put on and taking off regular lower body clothing?: Total 6 Click Score: 12    End of Session    OT Visit Diagnosis: Muscle weakness (generalized) (M62.81);Pain Pain - Right/Left: Left Pain - part of body: Shoulder (with too much shoulder flexion)   Activity Tolerance Patient tolerated treatment well   Patient Left in bed;with call bell/phone within reach   Nurse Communication          Time: 4103-0131 OT Time Calculation (min): 9 min  Charges: OT General Charges $OT Visit: 1 Visit OT Evaluation $OT Eval Moderate Complexity: 1 Mod OT Treatments $Therapeutic Exercise: 8-22 mins   [place] Circle, OTR/L Acute Re[facility] [phone] Office [phone]    Almon Register 04/16/2022, 4:26 PM

## 2022-04-16 NOTE — Progress Notes (Signed)
ANTICOAGULATION CONSULT NOTE  Pharmacy Consult for heparin Indication: atrial fibrillation  Allergies  Allergen Reactions   Penicillins Anaphylaxis    Patient tolerates cefepime (08/2017)   Iodinated Contrast Media    Minoxidil     Other reaction(s): hives    Patient Measurements: Height: '5\' 5"'$  (165.1 cm) Weight: (!) 184.6 kg (406 lb 15.5 oz) IBW/kg (Calculated) : 57 Heparin Dosing Weight: 100kg  Vital Signs: Temp: 98.3 F (36.8 C) (07/17 0827) Temp Source: Oral (07/17 0827) BP: 95/62 (07/17 0800) Pulse Rate: 71 (07/17 0800)  Labs: Recent Labs    04/14/22 0841 04/14/22 1700 04/15/22 0410 04/15/22 0500 04/15/22 1523 04/15/22 1923 04/16/22 0330  HGB 7.7*  --  7.8*  --   --   --  7.9*  HCT 26.6*  --  26.1*  --   --   --  26.5*  PLT 182  --  184  --   --   --  185  HEPARINUNFRC <0.10*   < >  --  0.18* 0.25*  --  0.30  CREATININE 0.39*  --  0.36*  --   --  0.44 0.39*   < > = values in this interval not displayed.     Estimated Creatinine Clearance: 130.7 mL/min (A) (by C-G formula based on SCr of 0.39 mg/dL (L)).   Medical History: Past Medical History:  Diagnosis Date   Aortic stenosis    Echocardiogram 02/21/11:  Mean gradient 23 mm of mercury; peak gradient 36;; Turbulence noted in the area of the aortic isthmus with peak gradient 23 mmHg-consider MRI to assess for coarctation;    TEE: 04/02/11:  EF 55-60%, mod BAE, trileaflet AV with mild AS (pk and mean 25 and 16), mild AI, mild MR, mild TR, atrial septal aneurysm, no evidence of corarctation   Arthritis    "qwhere" (12/05/2017)   Asthma    Cervical cancer ([place]) 2006   CHF (congestive heart failure) (HCC)    Chronic lower back pain    Complication of anesthesia    "I have a hard time waking up from under it" (12/05/2017)   COPD (chronic obstructive pulmonary disease) ([place])    "lung dr said I don't have this" (12/05/2017)   Coronary artery disease    Diastolic congestive heart failure ([place])    Echo 02/21/11: EF  55-60%, moderate LVH, grade 2 diastolic dysfunction, mild to moderate aortic stenosis with a mean gradient 23 mm of mercury   GERD (gastroesophageal reflux disease)    Heart murmur    Hepatitis C    History of gout X 1   Hyperlipidemia    Hypertension    Hypotension arterial 04/07/2022   Hypothyroid    IBS (irritable bowel syndrome)    Migraine    "monthly" (12/05/2017)   Obesity hypoventilation syndrome ([place])    On home oxygen therapy    "2L all the time" (12/05/2017)   OSA treated with BiPAP    "have CPAP at home too; wearing BiPAP right now" (12/05/2017)   Pneumonia    "several times" (12/05/2017)   Type II diabetes mellitus (HCC)       Assessment: 56yof with history Afib on apixaban PTA.  S/p bleeding at trach site and need for reversal with K-centra.  Bleeding resolved now stable off pressors.    Plan to see how patient tolerates anticoagulation prior to restarting apixaban.  -heparin level at the low end of goal on 2400 units/hr -hg= 7.9  Goal of Therapy:  Heparin level  0.3-0.7 units/ml Monitor platelets by anticoagulation protocol: Yes   Plan:  Increase heparin infusion to 2400 units/hr Daily heparin level and CBC  If tolerates anticoagulation - follow up restart oral AC   Hildred Laser, PharmD Clinical Pharmacist **Pharmacist phone directory can now be found on [email or web address] ([email or web address]).  Listed under [place].

## 2022-04-16 NOTE — Evaluation (Signed)
Occupational Therapy Evaluation Patient Details Name: Chelsea [name] MRN: [id number] DOB: 1966/07/24 Today's Date: 04/16/2022   History of Present Illness The pt is a 56 yo female presenting 7/8 from [facility] due to bleeding from tracheostomy. Eventually underwent surgical cauterization of wound on 7/10 to control bleeding. PMH includes: chronic respiratory failure with hypoxia (ventilator dependent), CHF, CAD, COPD, OSA on CPAP, afib, HLD, HTN, DM II, morbid obesity, and chronic back pain.   Clinical Impression   This 56 yo female admitted with above presents to acute OT with PLOF of needing A for most ADLs at bed level pta but could do most UB ADLs at bed level. Her arms are weak per her report with LUE weaker than RUE (pt is right handed and has some degenerative joint issues in left arm per her report)--she is interested in some theraband to do exercises for her arms while she is here. We will continue for one more session for this purpose.      Recommendations for follow up therapy are one component of a multi-disciplinary discharge planning process, led by the attending physician.  Recommendations may be updated based on patient status, additional functional criteria and insurance authorization.   Follow Up Recommendations  OT at Long-term acute care hospital    Assistance Recommended at Discharge Frequent or constant Supervision/Assistance  Patient can return home with the following Two people to help with walking and/or transfers;A lot of help with bathing/dressing/bathroom;Assistance with cooking/housework;Assistance with feeding    Functional Status Assessment  Patient has had a recent decline in their functional status and demonstrates the ability to make significant improvements in function in a reasonable and predictable amount of time.  Equipment Recommendations  None recommended by OT       Precautions / Restrictions Precautions Precautions: Fall Precaution Comments:  trach vent, sacral wounds Restrictions Weight Bearing Restrictions: No             ADL either performed or assessed with clinical judgement   ADL Overall ADL's : Needs assistance/impaired Eating/Feeding: NPO   Grooming: Wash/dry hands;Wash/dry face;Oral care;Applying deodorant;Set up;Bed level Grooming Details (indicate cue type and reason): brushing hair she needs A bed level due to laying back, in sitting per her report she comb/brush her own hair Upper Body Bathing: Set up;Bed level   Lower Body Bathing: Total assistance;Bed level   Upper Body Dressing : Maximal assistance;Bed level   Lower Body Dressing: Total assistance;Bed level                       Vision Patient Visual Report: No change from baseline              Pertinent Vitals/Pain Pain Assessment Pain Assessment: Faces Faces Pain Scale: Hurts little more Pain Location: left shoulder with too much flexion (past ~45 degrees)--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 Pain Descriptors / Indicators: Discomfort Pain Intervention(s): Limited activity within patient's tolerance, Repositioned, Monitored during session     Hand Dominance Right   Extremity/Trunk Assessment Upper Extremity Assessment Upper Extremity Assessment: RUE deficits/detail;LUE deficits/detail RUE Deficits / Details: Generally weak with strength varying from 3/5-4/5 LUE Deficits / Details: Generally weak with strength varying from 2/5-4/5           Communication Communication Communication: Tracheostomy   Cognition Arousal/Alertness: Awake/alert Behavior During Therapy: WFL for tasks assessed/performed Overall Cognitive Status: Within Functional Limits for tasks assessed  Home Living Family/patient expects to be discharged to:: Skilled nursing facility                                 Additional Comments: from [place]  and return to [facility]      Prior Functioning/Environment Prior Level of Function : Needs assist             Mobility Comments: dependent on staff at [facility], mostly bed bound, states she had been sitting EOB at last hospital. walked 15 ft with therapy here in 2019. ADLs Comments: pt reports she can wash her face, brush her hair, brush her teeth, and wash her upper body post set up; but needs A for everything else        OT Problem List: Decreased strength;Decreased range of motion;Obesity;Pain      OT Treatment/Interventions: Patient/family education;Therapeutic exercise    OT Goals(Current goals can be found in the care plan section) Acute Rehab OT Goals Patient Stated Goal: to be able to do arm exercises while she is here OT Goal Formulation: With patient Time For Goal Achievement: 04/30/22 Potential to Achieve Goals: Good  OT Frequency: Min 2X/week       AM-PAC OT "6 Clicks" Daily Activity     Outcome Measure Help from another person eating meals?: A Little Help from another person taking care of personal grooming?: A Little Help from another person toileting, which includes using toliet, bedpan, or urinal?: Total Help from another person bathing (including washing, rinsing, drying)?: A Little Help from another person to put on and taking off regular upper body clothing?: Total Help from another person to put on and taking off regular lower body clothing?: Total 6 Click Score: 12   End of Session    Activity Tolerance: Patient tolerated treatment well Patient left: in bed;with call bell/phone within reach  OT Visit Diagnosis: Muscle weakness (generalized) (M62.81);Pain Pain - Right/Left: Left Pain - part of body: Shoulder (with too much shoulder flexion)                Time: 1443-1540 OT Time Calculation (min): 13 min Charges:  OT General Charges $OT Visit: 1 Visit OT Evaluation $OT Eval Moderate Complexity: [place], OTR/L Acute [facility] [employer] [facility] [phone] Office [phone]    Almon Register 04/16/2022, 1:50 PM

## 2022-04-16 NOTE — TOC Progression Note (Signed)
Transition of Care [facility]) - Progression Note    Patient Details  Name: Chelsea [name] MRN: [id number] Date of Birth: Mar 09, 1966  Transition of [facility]) CM/SW Contact  Graves-Bigelow, Ocie Cornfield, RN Phone Number: 04/16/2022, 11:29 AM  Clinical Narrative: [employer] Liaison, resubmitted insurance authorization this morning. Awaiting to hear back from the insurance company.   Expected Discharge Plan: Long Term Acute Care (LTAC) Barriers to Discharge: Continued Medical Work up  Expected Discharge Plan and Services Expected Discharge Plan: Long Term Acute Care (LTAC)   Discharge Planning Services: CM Consult Post Acute Care Choice: Long Term Acute Care (LTAC) Living arrangements for the past 2 months: [place] ([facility])                  Readmission Risk Interventions    04/09/2022    4:17 PM  Readmission Risk Prevention Plan  Transportation Screening Complete  PCP or Specialist Appt within 3-5 Days Complete  HRI or Home Care Consult Complete  Social Work Consult for [place] Planning/Counseling Complete  Palliative Care Screening Not Applicable  Medication Review [employer]) Referral to Pharmacy

## 2022-04-16 NOTE — Progress Notes (Signed)
NAME:  Chelsea [name], MRN:  [id number], DOB:  December 25, 1965, LOS: 9 ADMISSION DATE:  04/07/2022, CONSULTATION DATE:  04/07/2022 REFERRING MD:  Dr. Francia Greaves, CHIEF COMPLAINT:  Bleeding trach    History of Present Illness:  Chelsea [name] is a 56 y.o. female who resides at [facility] with history of CHF with CAD, COPD, OSA, atrial fibrillation HLD, HTN, diabetes, and chronic back pain who presented to the ED from LTAC due to bleeding tracheostomy.  Per family tracheostomy was performed last week at [place].  Daughter reports patient has had significant forceful cough that began overnight and into today which she believes has led to trach bleeding.  Per family patient has not progressed to trach collar trials yet at [place].  On ED arrival patient was seen with mild tachycardia and eventual development of mild hypotension but remained alert and oriented.  Lab work on admission significant for chloride 88, glucose 384, BUN 29, albumin 2.8, AST 10, hemoglobin 8.0, INR 1.7.  ENT was consulted as well as PCCM for management of profusely bleeding tracheostomy.  ENT was able to pack the trach in the emergency department with control and bleeding.  Admitted to ICU for monitoring.   Pertinent  Medical History   CHF with CAD, COPD, atrial fibrillation OSA, HLD, HTN, diabetes, and chronic back pain  Significant Hospital Events: Including procedures, antibiotic start and stop dates in addition to other pertinent events   7/8 presented from [facility] with profusely bleeding tracheostomya 7/10 OR for ligation of bleeding vessel 7/11 sedated on mech vent; bleeding improved from trach; received 2 units prbcs; on 15 mcg levo and vaso 0.03 7/12 restarted heparin; off levo 7/13 black stools appreciated; ifob positive; hgb 6.8 got transfused, 7/14 back on full dose heparin  7/16 No recurrent hemoptysis on heparin  Interim History / Subjective:  Pt denies acute complaints  Afebrile  PSV wean, 50% fiO2,  PS 14  Glucose range 97-148 I/O 5.9L UOP, -3L in last 24 hours  Objective   Blood pressure (!) 111/58, pulse 76, temperature 98.3 F (36.8 C), temperature source Oral, resp. rate 20, height '5\' 5"'$  (1.651 m), weight (!) 184.6 kg, SpO2 92 %.    Vent Mode: CPAP;PSV FiO2 (%):  [50 %-60 %] 50 % Set Rate:  [22 bmp] 22 bmp Vt Set:  [450 mL] 450 mL PEEP:  [5 cmH20-8 cmH20] 8 cmH20 Pressure Support:  [14 cmH20] 14 cmH20 Plateau Pressure:  [13 cmH20-24 cmH20] 16 cmH20   Intake/Output Summary (Last 24 hours) at 04/16/2022 1014 Last data filed at 04/16/2022 1000 Gross per 24 hour  Intake 3264.95 ml  Output 5210 ml  Net -1945.05 ml   Filed Weights   04/14/22 0500 04/15/22 0400 04/16/22 0313  Weight: (!) 183.7 kg (!) 186 kg (!) 184.6 kg    Examination: General: chronically ill appearing female lying in bed in NAD on vent HEENT: MM pink/moist, #6XLT proxmial trach midline c/d/i, pupils equal Neuro: Awake, alert, communicates by mouthing words, appears appropriate  CV: s1s2 RRR, no m/r/g PULM: non-labored at rest, lungs bilaterally with rhonchi, thin secretions with tiny flecks of blood GI: soft, bsx4 active, cortrak in place  Extremities: warm/dry, changes in BLE c/w chronic venous stasis, 1+ edema  Skin: no rashes or lesions  Resolved Hospital Problem list   Shock (resolved) Sepsis resolved  Assessment & Plan:   Hemoptysis due to bleeding from tracheostomy site S/p ENT ligation 7/10 -appreciate ENT -trach care per protocol  -monitor for further bleeding  OSA with OHS  Chronic hypoxic respiratory failure now status post percutaneous tracheostomy with vent dependence -PRVC with LTVV -daily SBT/ PSV wean as tolerated, not tolerating ATC -wean PEEP / fiO2 for sats >90% -follow intermittent CXR  -monitor off abx  Enterococcus Faecalis in resp culture, suspect colonizer  -suspected colonizer -monitor off abx -follow up repeat culture  Acute blood loss anemia, anemia of  chronic disease Possible GI bleed versus swallowed blood from tracheal bleeding -BID PPI for now, consider titration back to QD once clearly stable from bleeding standpoint  -transition back to eliquis 7/17 -monitor CBC  -transfuse for Hgb <7%  History of diastolic congestive heart failure Chronic atrial fibrillation anticoagulated with Eliquis History of hypertension/hyperlipidemia Moderate aortic stenosis -continue home midodrine -follow up AS as outpatient  -discontinue central line   Type 2 Diabetes -SSI, resistant scale  -continue 10 units Q4 as feeding coverage  -Glargine 20 units QD  -goal glucose 140-180  Hypothyroidism  -synthroid PT  Morbid Obesity -long term modest weigh loss   Chronic Hypotension -continue home midodrine  -follow BP trend   At risk for malnutrition -TF per nutrition   Urinary retention, present on admission -continue foley catheter   Deconditioning -PT / OT as able   Best Practice (right click and "Reselect all SmartList Selections" daily)  Diet/type: tubefeeds  DVT prophylaxis: systemic heparin GI prophylaxis: PPI Lines: Central line Foley:  Yes, and it is still needed- chronic foley Code Status:  full code Last date of multidisciplinary goals of care discussion: 7/12 Dewayne updated over phone. Called Dewayne for update 7/17, no answer but message left for return call.   Critical Care Time: 82 minutes    Noe Gens, MSN, APRN, NP-C, AGACNP-BC [facility] 04/16/2022, 10:14 AM   Please see [email or web address] for pager details.   From 7A-7P if no response, please call [phone] After hours, please call ELink [phone]

## 2022-04-17 DIAGNOSIS — J9621 Acute and chronic respiratory failure with hypoxia: Secondary | ICD-10-CM | POA: Diagnosis not present

## 2022-04-17 DIAGNOSIS — R049 Hemorrhage from respiratory passages, unspecified: Secondary | ICD-10-CM | POA: Diagnosis not present

## 2022-04-17 DIAGNOSIS — Z9911 Dependence on respirator [ventilator] status: Secondary | ICD-10-CM | POA: Diagnosis not present

## 2022-04-17 DIAGNOSIS — J9622 Acute and chronic respiratory failure with hypercapnia: Secondary | ICD-10-CM | POA: Diagnosis not present

## 2022-04-17 LAB — GLUCOSE, CAPILLARY
Glucose-Capillary: 115 mg/dL — ABNORMAL HIGH (ref 70–99)
Glucose-Capillary: 122 mg/dL — ABNORMAL HIGH (ref 70–99)
Glucose-Capillary: 129 mg/dL — ABNORMAL HIGH (ref 70–99)
Glucose-Capillary: 136 mg/dL — ABNORMAL HIGH (ref 70–99)
Glucose-Capillary: 139 mg/dL — ABNORMAL HIGH (ref 70–99)
Glucose-Capillary: 152 mg/dL — ABNORMAL HIGH (ref 70–99)
Glucose-Capillary: 159 mg/dL — ABNORMAL HIGH (ref 70–99)

## 2022-04-17 LAB — CBC
HCT: 27.9 % — ABNORMAL LOW (ref 36.0–46.0)
Hemoglobin: 8.2 g/dL — ABNORMAL LOW (ref 12.0–15.0)
MCH: 27 pg (ref 26.0–34.0)
MCHC: 29.4 g/dL — ABNORMAL LOW (ref 30.0–36.0)
MCV: 91.8 fL (ref 80.0–100.0)
Platelets: 194 10*3/uL (ref 150–400)
RBC: 3.04 MIL/uL — ABNORMAL LOW (ref 3.87–5.11)
RDW: 17.2 % — ABNORMAL HIGH (ref 11.5–15.5)
WBC: 4.8 10*3/uL (ref 4.0–10.5)
nRBC: 0 % (ref 0.0–0.2)

## 2022-04-17 LAB — BASIC METABOLIC PANEL
Anion gap: 6 (ref 5–15)
BUN: 14 mg/dL (ref 6–20)
CO2: 39 mmol/L — ABNORMAL HIGH (ref 22–32)
Calcium: 8.3 mg/dL — ABNORMAL LOW (ref 8.9–10.3)
Chloride: 95 mmol/L — ABNORMAL LOW (ref 98–111)
Creatinine, Ser: 0.42 mg/dL — ABNORMAL LOW (ref 0.44–1.00)
GFR, Estimated: >60 mL/min (ref >60)
Glucose, Bld: 134 mg/dL — ABNORMAL HIGH (ref 70–99)
Potassium: 3.7 mmol/L (ref 3.5–5.1)
Sodium: 140 mmol/L (ref 135–145)

## 2022-04-17 MED ORDER — DIPHENHYDRAMINE HCL 25 MG PO CAPS
25.0000 mg | ORAL_CAPSULE | Freq: Four times a day (QID) | ORAL | Status: AC | PRN
  Administered 2022-04-18: 25 mg
  Filled 2022-04-17: qty 1

## 2022-04-17 MED ORDER — POLYETHYLENE GLYCOL 3350 17 G PO PACK: 17.0000 g | PACK | Freq: Every day | ORAL | Status: DC | PRN

## 2022-04-17 MED ORDER — FUROSEMIDE 10 MG/ML IJ SOLN
40.0000 mg | Freq: Two times a day (BID) | INTRAMUSCULAR | Status: DC
  Administered 2022-04-17 – 2022-04-18 (×3): 40 mg via INTRAVENOUS
  Filled 2022-04-17 (×3): qty 4

## 2022-04-17 MED ORDER — DOCUSATE SODIUM 50 MG/5ML PO LIQD
100.0000 mg | Freq: Two times a day (BID) | ORAL | Status: DC | PRN
Start: 2022-04-17 — End: 2022-04-22

## 2022-04-17 MED ORDER — APIXABAN 5 MG PO TABS
5.0000 mg | ORAL_TABLET | Freq: Two times a day (BID) | ORAL | Status: DC
  Administered 2022-04-17 – 2022-04-22 (×10): 5 mg
  Filled 2022-04-17 (×9): qty 1

## 2022-04-17 MED ORDER — ACETAMINOPHEN 160 MG/5ML PO SOLN
650.0000 mg | Freq: Four times a day (QID) | ORAL | Status: DC | PRN
  Administered 2022-04-18 – 2022-04-21 (×8): 650 mg
  Filled 2022-04-17 (×8): qty 20.3

## 2022-04-17 MED ORDER — POTASSIUM CHLORIDE 20 MEQ PO PACK
60.0000 meq | PACK | Freq: Once | ORAL | Status: AC
  Administered 2022-04-17: 60 meq
  Filled 2022-04-17: qty 3

## 2022-04-17 MED ORDER — FUROSEMIDE 10 MG/ML IJ SOLN: 40.0000 mg | Freq: Every day | INTRAMUSCULAR | Status: DC

## 2022-04-17 NOTE — Progress Notes (Signed)
NAME:  Chelsea [name], MRN:  [id number], DOB:  05-Oct-1965, LOS: 64 ADMISSION DATE:  04/07/2022, CONSULTATION DATE:  04/07/2022 REFERRING MD:  Dr. Francia Greaves, CHIEF COMPLAINT:  Bleeding trach    History of Present Illness:  Chelsea [name] is a 56 y.o. female who resides at [facility] with history of CHF with CAD, COPD, OSA, atrial fibrillation HLD, HTN, diabetes, and chronic back pain who presented to the ED from LTAC due to bleeding tracheostomy.  Per family tracheostomy was performed last week at [place].  Daughter reports patient has had significant forceful cough that began overnight and into today which she believes has led to trach bleeding.  Per family patient has not progressed to trach collar trials yet at [facility].  On ED arrival patient was seen with mild tachycardia and eventual development of mild hypotension but remained alert and oriented.  Lab work on admission significant for chloride 88, glucose 384, BUN 29, albumin 2.8, AST 10, hemoglobin 8.0, INR 1.7.  ENT was consulted as well as PCCM for management of profusely bleeding tracheostomy.  ENT was able to pack the trach in the emergency department with control and bleeding.  Admitted to ICU for monitoring.   Pertinent  Medical History   CHF with CAD, COPD, atrial fibrillation OSA, HLD, HTN, diabetes, and chronic back pain  Significant Hospital Events: Including procedures, antibiotic start and stop dates in addition to other pertinent events   7/8 presented from [facility] with profusely bleeding tracheostomya 7/10 OR for ligation of bleeding vessel 7/11 sedated on mech vent; bleeding improved from trach; received 2 units prbcs; on 15 mcg levo and vaso 0.03 7/12 restarted heparin; off levo 7/13 black stools appreciated; ifob positive; hgb 6.8 got transfused, 7/14 back on full dose heparin  7/16 No recurrent hemoptysis on heparin  Interim History / Subjective:  Pt working with PT, no acute complaints. Happy to be  sitting on the side of the bed Scant old blood reported in trach Afebrile  Objective   Blood pressure (!) 101/55, pulse 76, temperature 98.6 F (37 C), temperature source Oral, resp. rate (!) 21, height '5\' 5"'$  (1.651 m), weight (!) 177.4 kg, SpO2 93 %.    Vent Mode: CPAP;PSV FiO2 (%):  [50 %] 50 % Set Rate:  [22 bmp] 22 bmp Vt Set:  [450 mL] 450 mL PEEP:  [8 cmH20] 8 cmH20 Pressure Support:  [14 cmH20] 14 cmH20 Plateau Pressure:  [13 cmH20-18 cmH20] 13 cmH20   Intake/Output Summary (Last 24 hours) at 04/17/2022 1102 Last data filed at 04/17/2022 1000 Gross per 24 hour  Intake 1793.07 ml  Output 913 ml  Net 880.07 ml   Filed Weights   04/15/22 0400 04/16/22 0313 04/17/22 0530  Weight: (!) 186 kg (!) 184.6 kg (!) 177.4 kg    Examination: General: adult female lying in bed in NAD on vent  HEENT: MM pink/moist, #6XLT proximal trach midline c/d/i, pupils equal  Neuro: AAOx4, able to communicate by mouthing words, appears appropriate  CV: s1s2 RRR, no m/r/g PULM: non-labored at rest and when working with PT, soft rhonchi bilaterally  GI: soft, bsx4 active  Extremities: warm/dry, changes in BLE c/w venous stasis, chronic edema  Skin: no rashes or lesions  Resolved Hospital Problem list   Shock (resolved) Sepsis resolved  Assessment & Plan:   Hemoptysis due to bleeding from tracheostomy site S/p ENT ligation 7/10 -appreciate ENT assistance with patient care -trach care per protocol -no further bleeding, monitor secretions  -avoid suction  trauma   OSA with OHS  Chronic hypoxic respiratory failure now status post percutaneous tracheostomy with vent dependence -PRVC as rest mode ventilation  -SBT as tolerated but requiring high pressures for "wean" efforts  -wean PEEP / fiO2 for sats >90% -mobilize as able  -follow intermittent CXR   Enterococcus Faecalis in resp culture, suspect colonizer  Suspected colonizer -monitor off abx -repeat respiratory culture pending,  normal flora noted thus far   Acute blood loss anemia, anemia of chronic disease Possible GI bleed versus swallowed blood from tracheal bleeding -BID PPI for now, consider transition back to QD on 7/19  -continue eliquis per pharmacy, transitioned back 7/17  -transfuse for Hgb <7% or active bleeding -trend CBC  History of diastolic congestive heart failure Chronic atrial fibrillation anticoagulated with Eliquis History of hypertension/hyperlipidemia Moderate aortic stenosis Chronic Hypotension -continue home midodrine  -AS follow up as outpatient  -monitor BP trend   Type 2 Diabetes -SSI, resistant scale  -Q4 TF coverage 10 units -glargine 20 units QD  -glucose goal 140-180  Hypothyroidism  -synthroid PT  Morbid Obesity -consider long term modest weight loss  At risk for malnutrition -TF per Nutrition   Urinary retention, present on admission -foley catheter, continue   Deconditioning -appreciate PT efforts   Best Practice (right click and "Reselect all SmartList Selections" daily)  Diet/type: tubefeeds  DVT prophylaxis: systemic heparin GI prophylaxis: PPI Lines: Central line Foley:  Yes, and it is still needed- chronic foley Code Status:  full code Last date of multidisciplinary goals of care discussion: 7/12 Dewayne updated over phone. Called Dewayne for update 7/17, no answer but message left for return call.   Critical Care Time: 31 minutes    Noe Gens, MSN, APRN, NP-C, AGACNP-BC [facility] 04/17/2022, 11:02 AM   Please see [email or web address] for pager details.   From 7A-7P if no response, please call [phone] After hours, please call ELink [phone]

## 2022-04-17 NOTE — Progress Notes (Signed)
Peer to peer completed, spent 20 min reviewing case. Patient declined due to feeling from medical reviewer that her respiratory failure is due to pleural effusions based on CXR report. Will notify case management.  Julian Hy, DO 04/17/22 1:37 PM [facility]

## 2022-04-17 NOTE — TOC Progression Note (Addendum)
Transition of Care [facility]) - Progression Note    Patient Details  Name: Chelsea [name] MRN: [id number] Date of Birth: [birth year]  Transition [facility]) CM/SW Contact  Graves-Bigelow, Ocie Cornfield, RN Phone Number: 04/17/2022, 11:00 AM  Clinical Narrative:  Case Manager was notified by [facility] that the MD needs to call in a Peer to Peer to [phone] option 5 by 1400 this afternoon. [place] did reach out to the provider to make them aware. Case Manager will continue to await the outcome.   04-17-22 1633 Dr. Noemi Chapel called the P2P- patient was declined by insurance. Case Manager called Raquel Sarna Liaison with [facility] and the information for an expedited appeal has been signed from Dr. Carlis Abbott. Information scanned to [place] with [facility]. Case Manager will continue to follow for additional needs.   Expected Discharge Plan: Long Term Acute Care (LTAC) Barriers to Discharge: Continued Medical Work up  Expected Discharge Plan and Services Expected Discharge Plan: Long Term Acute Care (LTAC)   Discharge Planning Services: CM Consult Post Acute Care Choice: Long Term Acute Care (LTAC) Living arrangements for the past 2 months: [place] ([facility])                   Readmission Risk Interventions    04/09/2022    4:17 PM  Readmission Risk Prevention Plan  Transportation Screening Complete  PCP or Specialist Appt within 3-5 Days Complete  HRI or Home Care Consult Complete  Social Work Consult for [place] Planning/Counseling Complete  Palliative Care Screening Not Applicable  Medication Review Press photographer) Referral to Pharmacy

## 2022-04-17 NOTE — Consult Note (Addendum)
[name] Nurse wound follow up Refer to previous consult note on 7/14.  Wound type: Left heel with darker-colored Deep tissue pressure injury slowly decreasing in size and resolving; .2X.2cm Dressing procedure/placement/frequency: Foam dressing is in place and Prevalon boots are intact and reducing pressure.  [name] team will reassess next week to determine if a change in the plan of care is indicated at that time.   Thank-you,  [name] MSN, [place], [place], [place], [place]

## 2022-04-17 NOTE — Progress Notes (Signed)
Physical Therapy Treatment Patient Details Name: Chelsea [name] MRN: [id number] DOB: 05-13-1966 Today's Date: 04/17/2022   History of Present Illness 56 yo female admitted 7/8 from [facility] due to bleeding from tracheostomy. 7/10 surgical cauterization of wound. PMHx: chronic resp failure (vent dependent), CHF, CAD, COPD, OSA, Afib, HLD, HTN, DM II, morbid obesity, and chronic back pain.    PT Comments    Pt very pleasant and unable to state when her last trach collar trial was or when the last time she sat EOB was. Pt happy to be able to sit EOB with VSS and good tolerance for bil LE HEP. Will continue to follow.   FiO2 50% on CPAP/PS, peep 8, SpO2 95%    Recommendations for follow up therapy are one component of a multi-disciplinary discharge planning process, led by the attending physician.  Recommendations may be updated based on patient status, additional functional criteria and insurance authorization.  Follow Up Recommendations  PT at Long-term acute care hospital     Assistance Recommended at Discharge Frequent or constant Supervision/Assistance  Patient can return home with the following Two people to help with walking and/or transfers;Two people to help with bathing/dressing/bathroom;Assistance with cooking/housework;Assistance with feeding;Direct supervision/assist for medications management;Direct supervision/assist for financial management;Assist for transportation;Help with stairs or ramp for entrance   Equipment Recommendations  None recommended by PT    Recommendations for Other Services       Precautions / Restrictions Precautions Precautions: Fall Precaution Comments: trach, vent, sacral wounds, cortrak Restrictions Weight Bearing Restrictions: No     Mobility  Bed Mobility Overal bed mobility: Needs Assistance Bed Mobility: Supine to Sit, Sit to Supine     Supine to sit: Max assist, HOB elevated Sit to supine: Max assist, +2 for physical  assistance   General bed mobility comments: pt with partial roll to left with use of rail with assist to clear legs and elevate trunk into sitting EOB with HOB 25 degrees. EOB 6 min before sliding with max +2 to lift legs and pivot back to bed. Total +2 to slide toward [facility]    Transfers                   General transfer comment: unable at baseline    Ambulation/Gait                   Stairs             Wheelchair Mobility    Modified Rankin (Stroke Patients Only)       Balance Overall balance assessment: Needs assistance   Sitting balance-Leahy Scale: Fair Sitting balance - Comments: able to maintain brief periods sitting EOB without UE support                                    Cognition Arousal/Alertness: Awake/alert Behavior During Therapy: WFL for tasks assessed/performed Overall Cognitive Status: Within Functional Limits for tasks assessed                                          Exercises General Exercises - Lower Extremity Short Arc Quad: AROM, Both, 15 reps, Seated    General Comments        Pertinent Vitals/Pain Pain Assessment Pain Assessment: No/denies pain    Home Living  Prior Function            PT Goals (current goals can now be found in the care plan section) Progress towards PT goals: Progressing toward goals    Frequency    Min 2X/week      PT Plan Current plan remains appropriate    Co-evaluation              AM-PAC PT "6 Clicks" Mobility   Outcome Measure  Help needed turning from your back to your side while in a flat bed without using bedrails?: A Lot Help needed moving from lying on your back to sitting on the side of a flat bed without using bedrails?: Total Help needed moving to and from a bed to a chair (including a wheelchair)?: Total Help needed standing up from a chair using your arms (e.g., wheelchair or bedside  chair)?: Total Help needed to walk in hospital room?: Total Help needed climbing 3-5 steps with a railing? : Total 6 Click Score: 7    End of Session   Activity Tolerance: Patient tolerated treatment well Patient left: in bed;with call bell/phone within reach Nurse Communication: Mobility status;Need for lift equipment PT Visit Diagnosis: Other abnormalities of gait and mobility (R26.89);Muscle weakness (generalized) (M62.81)     Time: 3112-1624 PT Time Calculation (min) (ACUTE ONLY): 21 min  Charges:  $Therapeutic Activity: 8-22 mins                     Bayard Males, PT Acute Rehabilitation Services Office: [phone]    Manal Kreutzer B Spencer Cardinal 04/17/2022, 9:02 AM

## 2022-04-18 ENCOUNTER — Inpatient Hospital Stay (HOSPITAL_COMMUNITY): Payer: Medicare Other

## 2022-04-18 DIAGNOSIS — D62 Acute posthemorrhagic anemia: Secondary | ICD-10-CM | POA: Diagnosis not present

## 2022-04-18 DIAGNOSIS — Z9911 Dependence on respirator [ventilator] status: Secondary | ICD-10-CM | POA: Diagnosis not present

## 2022-04-18 DIAGNOSIS — R049 Hemorrhage from respiratory passages, unspecified: Secondary | ICD-10-CM | POA: Diagnosis not present

## 2022-04-18 DIAGNOSIS — J9621 Acute and chronic respiratory failure with hypoxia: Secondary | ICD-10-CM | POA: Diagnosis not present

## 2022-04-18 LAB — CBC
HCT: 28.3 % — ABNORMAL LOW (ref 36.0–46.0)
Hemoglobin: 8.6 g/dL — ABNORMAL LOW (ref 12.0–15.0)
MCH: 27.8 pg (ref 26.0–34.0)
MCHC: 30.4 g/dL (ref 30.0–36.0)
MCV: 91.6 fL (ref 80.0–100.0)
Platelets: 210 10*3/uL (ref 150–400)
RBC: 3.09 MIL/uL — ABNORMAL LOW (ref 3.87–5.11)
RDW: 17.4 % — ABNORMAL HIGH (ref 11.5–15.5)
WBC: 5.3 10*3/uL (ref 4.0–10.5)
nRBC: 0 % (ref 0.0–0.2)

## 2022-04-18 LAB — BASIC METABOLIC PANEL
Anion gap: 8 (ref 5–15)
BUN: 14 mg/dL (ref 6–20)
CO2: 36 mmol/L — ABNORMAL HIGH (ref 22–32)
Calcium: 8.4 mg/dL — ABNORMAL LOW (ref 8.9–10.3)
Chloride: 94 mmol/L — ABNORMAL LOW (ref 98–111)
Creatinine, Ser: 0.45 mg/dL (ref 0.44–1.00)
GFR, Estimated: >60 mL/min (ref >60)
Glucose, Bld: 137 mg/dL — ABNORMAL HIGH (ref 70–99)
Potassium: 4 mmol/L (ref 3.5–5.1)
Sodium: 138 mmol/L (ref 135–145)

## 2022-04-18 LAB — CULTURE, RESPIRATORY W GRAM STAIN: Culture: NORMAL

## 2022-04-18 LAB — GLUCOSE, CAPILLARY
Glucose-Capillary: 163 mg/dL — ABNORMAL HIGH (ref 70–99)
Glucose-Capillary: 143 mg/dL — ABNORMAL HIGH (ref 70–99)
Glucose-Capillary: 121 mg/dL — ABNORMAL HIGH (ref 70–99)
Glucose-Capillary: 138 mg/dL — ABNORMAL HIGH (ref 70–99)
Glucose-Capillary: 130 mg/dL — ABNORMAL HIGH (ref 70–99)
Glucose-Capillary: 128 mg/dL — ABNORMAL HIGH (ref 70–99)

## 2022-04-18 MED ORDER — FUROSEMIDE 10 MG/ML IJ SOLN
40.0000 mg | Freq: Three times a day (TID) | INTRAMUSCULAR | Status: DC
  Administered 2022-04-18 – 2022-04-20 (×5): 40 mg via INTRAVENOUS
  Filled 2022-04-18 (×5): qty 4

## 2022-04-18 MED ORDER — POTASSIUM CHLORIDE 20 MEQ PO PACK
20.0000 meq | PACK | Freq: Once | ORAL | Status: AC
  Administered 2022-04-18: 20 meq
  Filled 2022-04-18: qty 1

## 2022-04-18 MED ORDER — POTASSIUM CHLORIDE 20 MEQ PO PACK
20.0000 meq | PACK | Freq: Once | ORAL | Status: DC
Start: 2022-04-18 — End: 2022-04-18

## 2022-04-18 NOTE — Progress Notes (Signed)
PCCM Interval Progress Note  Discussed patient with Dr. Redmond Baseman (ENT) 7/19AM regarding trach suture removal. Dr. Redmond Baseman confirmed ok to remove sutures.  Patient premedicated with PRN Dilaudid '1mg'$ . Suture x 4 clipped and removed without issue. Macerated skin noted underneath trach and appropriate trach/skin care completed by RN. Mepilex and gauzed replaced. Patient tolerated well.  Lestine Mount, PA-C [facility] 04/18/22 3:38 PM  Please see [email or web address] for pager details.  From 7A-7P if no response, please call [phone] After hours, please call ELink [phone]

## 2022-04-18 NOTE — Progress Notes (Signed)
NAME:  Chelsea [name], MRN:  [id number], DOB:  September 19, 1966, LOS: 12 ADMISSION DATE:  04/07/2022, CONSULTATION DATE:  04/07/2022 REFERRING MD:  Dr. Francia Greaves, CHIEF COMPLAINT:  Bleeding trach    History of Present Illness:  Chelsea [name] is a 56 y.o. female who resides at [facility] with history of CHF with CAD, COPD, OSA, atrial fibrillation HLD, HTN, diabetes, and chronic back pain who presented to the ED from LTAC due to bleeding tracheostomy.  Per family tracheostomy was performed last week at [place].  Daughter reports patient has had significant forceful cough that began overnight and into today which she believes has led to trach bleeding.  Per family patient has not progressed to trach collar trials yet at [facility].  On ED arrival patient was seen with mild tachycardia and eventual development of mild hypotension but remained alert and oriented.  Lab work on admission significant for chloride 88, glucose 384, BUN 29, albumin 2.8, AST 10, hemoglobin 8.0, INR 1.7.  ENT was consulted as well as PCCM for management of profusely bleeding tracheostomy.  ENT was able to pack the trach in the emergency department with control and bleeding.  Admitted to ICU for monitoring.   Pertinent Medical History:   CHF with CAD, COPD, atrial fibrillation OSA, HLD, HTN, diabetes, and chronic back pain  Significant Hospital Events: Including procedures, antibiotic start and stop dates in addition to other pertinent events   7/8 presented from [facility] with profusely bleeding tracheostomya 7/10 OR for ligation of bleeding vessel 7/11 sedated on mech vent; bleeding improved from trach; received 2 units prbcs; on 15 mcg levo and vaso 0.03 7/12 restarted heparin; off levo 7/13 black stools appreciated; ifob positive; hgb 6.8 got transfused, 7/14 back on full dose heparin  7/16 No recurrent hemoptysis on heparin 7/18 Tolerating PSV 14/8, FiO2 50%. Peer-to-peer completed for possible return to [facility],  denied due to "pleural effusions" 7/19 Weaning PSV 14/8, FiO2 50%. Trach sutures remain in place.   Interim History / Subjective:  No significant events overnight Tolerating vent wean/PSV 14/8, FiO2 50% CXR 7/19AM with bibasilar opacities, atelectasis vs. Edema Currently denied from returning to [facility] due to "pleural effusions" Continuing diuresis as tolerated\  Objective:  Blood pressure (!) 90/55, pulse 63, temperature 98.4 F (36.9 C), temperature source Oral, resp. rate 20, height '5\' 5"'$  (1.651 m), weight (!) 178.7 kg, SpO2 93 %.    Vent Mode: PRVC FiO2 (%):  [50 %] 50 % Set Rate:  [22 bmp] 22 bmp Vt Set:  [450 mL] 450 mL PEEP:  [8 cmH20] 8 cmH20 Pressure Support:  [14 cmH20] 14 cmH20 Plateau Pressure:  [22 cmH20-23 cmH20] 22 cmH20   Intake/Output Summary (Last 24 hours) at 04/18/2022 0748 Last data filed at 04/18/2022 0700 Gross per 24 hour  Intake 1940 ml  Output 3155 ml  Net -1215 ml    Filed Weights   04/16/22 0313 04/17/22 0530 04/18/22 0500  Weight: (!) 184.6 kg (!) 177.4 kg (!) 178.7 kg   Physical Examination: General: Chronically ill-appearing middle-aged woman in NAD. Sitting up in bed. HEENT: [place]/AT, anicteric sclera, PERRL, moist mucous membranes. Tracheostomy in place with gauze, sutures intact. Neuro: Awake, oriented x 4. Responds to verbal stimuli. Following commands consistently. Moves all 4 extremities spontaneously. CV: RRR, +III/VI systolic murmur heard best at RUSB. PULM: Breathing even and unlabored on vent (PSV 14/8, FiO2 50%). Lung fields coarse throughout, diminished at bases bilaterally. GI: Obese, soft, nontender, nondistended. Normoactive bowel sounds. Extremities: Bilateral chronic  1-2+ pitting LE edema noted. Skin: Warm/dry, BLE with venous stasis changes.  Resolved Hospital Problem List:  Shock (resolved) Sepsis resolved  Assessment & Plan:   Hemoptysis due to bleeding from tracheostomy site S/p ENT ligation 7/10 Redmond Baseman). - Appreciate  ENT assistance - Re-engage ENT 7/19 for ?trach suture removal - Trach care per protocol - Monitor for further bleeding, secretions - Avoid oversuctioning  OSA with OHS  Chronic hypoxic respiratory failure now status post percutaneous tracheostomy with vent dependence - Continue full vent support (4-8cc/kg IBW) - Tolerating vent wean PSV 14/8, FiO2 50% - Wean FiO2 for O2 sat > 90% - Daily WUA/SBT - VAP bundle - Pulmonary hygiene, encourage mobilization as able - Intermittent CXR - Diuresis as tolerated for edema, no significant pleural effusions present on CXR 7/19  Enterococcus Faecalis in resp culture, suspect colonizer  Suspected colonizer. - Trend CBC - Monitor off of antibiotics - Repeat resp Cx pending 7/16, few WBC/rare gram positive cocci in pairs, rare yeast  Acute blood loss anemia, anemia of chronic disease Possible GI bleed versus swallowed blood from tracheal bleeding. - PPI BID, likely ok to transition to daily 7/19 - Eliquis per pharmacy (resumed 7/11) - Trend H&H - Monitor for signs of active bleeding - Transfuse for Hgb < 7.0 or hemodynamically significant bleeding  History of diastolic congestive heart failure Chronic atrial fibrillation anticoagulated with Eliquis History of hypertension/hyperlipidemia Moderate aortic stenosis Chronic Hypotension - Continue midodrine - Outpatient f/u for AS - Cardiac monitoring  Type 2 Diabetes - CBGs Q4H, goal 140-180 - Basal glargine - TF coverage Q4H + SSI  Hypothyroidism  - Continue Synthroid  Morbid Obesity - Encourage long-term modest weight loss  At risk for malnutrition - Appreciate Nutrition/RD assistance - TF per Nutrition  Urinary retention, present on admission - Foley catheter for ongoing urinary retention  Deconditioning - PT/OT/SLP, appreciate asisstance  Best Practice (right click and "Reselect all SmartList Selections" daily)  Diet/type: tubefeeds  DVT prophylaxis: systemic heparin GI  prophylaxis: PPI Lines: Central line Foley:  Yes, and it is still needed - chronic foley Code Status:  full code Last date of multidisciplinary goals of care discussion: 7/12 Dewayne updated over phone. Called Dewayne for update 7/17, no answer but message left for return call. Will reattempt 7/19.  Ongoing attempts to transition patient back to [facility], pending likely need for additional peer-to-peer review.  Critical Care Time:   The patient is critically ill with multiple organ system failure and requires high complexity decision making for assessment and support, frequent evaluation and titration of therapies, advanced monitoring, review of radiographic studies and interpretation of complex data.   Critical Care Time devoted to patient care services, exclusive of separately billable procedures, described in this note is 38 minutes.  Lestine Mount, PA-C [facility] 04/18/22 7:49 AM  Please see [email or web address] for pager details.  From 7A-7P if no response, please call [phone] After hours, please call ELink [phone]

## 2022-04-19 DIAGNOSIS — J9621 Acute and chronic respiratory failure with hypoxia: Secondary | ICD-10-CM | POA: Diagnosis not present

## 2022-04-19 DIAGNOSIS — Z9911 Dependence on respirator [ventilator] status: Secondary | ICD-10-CM | POA: Diagnosis not present

## 2022-04-19 DIAGNOSIS — D62 Acute posthemorrhagic anemia: Secondary | ICD-10-CM | POA: Diagnosis not present

## 2022-04-19 DIAGNOSIS — R049 Hemorrhage from respiratory passages, unspecified: Secondary | ICD-10-CM | POA: Diagnosis not present

## 2022-04-19 LAB — GLUCOSE, CAPILLARY
Glucose-Capillary: 170 mg/dL — ABNORMAL HIGH (ref 70–99)
Glucose-Capillary: 151 mg/dL — ABNORMAL HIGH (ref 70–99)
Glucose-Capillary: 133 mg/dL — ABNORMAL HIGH (ref 70–99)
Glucose-Capillary: 161 mg/dL — ABNORMAL HIGH (ref 70–99)
Glucose-Capillary: 147 mg/dL — ABNORMAL HIGH (ref 70–99)
Glucose-Capillary: 133 mg/dL — ABNORMAL HIGH (ref 70–99)

## 2022-04-19 LAB — BASIC METABOLIC PANEL
Anion gap: 11 (ref 5–15)
BUN: 15 mg/dL (ref 6–20)
CO2: 37 mmol/L — ABNORMAL HIGH (ref 22–32)
Calcium: 8.6 mg/dL — ABNORMAL LOW (ref 8.9–10.3)
Chloride: 90 mmol/L — ABNORMAL LOW (ref 98–111)
Creatinine, Ser: 0.48 mg/dL (ref 0.44–1.00)
GFR, Estimated: >60 mL/min (ref >60)
Glucose, Bld: 142 mg/dL — ABNORMAL HIGH (ref 70–99)
Potassium: 3.7 mmol/L (ref 3.5–5.1)
Sodium: 138 mmol/L (ref 135–145)

## 2022-04-19 LAB — CBC
HCT: 30.4 % — ABNORMAL LOW (ref 36.0–46.0)
Hemoglobin: 9.1 g/dL — ABNORMAL LOW (ref 12.0–15.0)
MCH: 27.1 pg (ref 26.0–34.0)
MCHC: 29.9 g/dL — ABNORMAL LOW (ref 30.0–36.0)
MCV: 90.5 fL (ref 80.0–100.0)
Platelets: 234 10*3/uL (ref 150–400)
RBC: 3.36 MIL/uL — ABNORMAL LOW (ref 3.87–5.11)
RDW: 17.5 % — ABNORMAL HIGH (ref 11.5–15.5)
WBC: 6.5 10*3/uL (ref 4.0–10.5)
nRBC: 0 % (ref 0.0–0.2)

## 2022-04-19 LAB — PHOSPHORUS: Phosphorus: 5 mg/dL — ABNORMAL HIGH (ref 2.5–4.6)

## 2022-04-19 LAB — MAGNESIUM: Magnesium: 1.9 mg/dL (ref 1.7–2.4)

## 2022-04-19 MED ORDER — POTASSIUM CHLORIDE 20 MEQ PO PACK
40.0000 meq | PACK | Freq: Once | ORAL | Status: AC
  Administered 2022-04-19: 40 meq
  Filled 2022-04-19: qty 2

## 2022-04-19 MED ORDER — VITAL HIGH PROTEIN PO LIQD
1000.0000 mL | ORAL | Status: DC
  Administered 2022-04-20 – 2022-04-21 (×2): 1000 mL

## 2022-04-19 MED ORDER — MAGNESIUM SULFATE 2 GM/50ML IV SOLN
2.0000 g | Freq: Once | INTRAVENOUS | Status: AC
  Administered 2022-04-19: 2 g via INTRAVENOUS
  Filled 2022-04-19: qty 50

## 2022-04-19 MED ORDER — DIPHENHYDRAMINE HCL 25 MG PO CAPS
25.0000 mg | ORAL_CAPSULE | Freq: Once | ORAL | Status: AC
  Administered 2022-04-19: 25 mg
  Filled 2022-04-19: qty 1

## 2022-04-19 NOTE — Progress Notes (Signed)
Pt placed on PS/CPAP 12/8 and is tolerating well. RT will monitor.

## 2022-04-19 NOTE — Progress Notes (Signed)
Nutrition Follow-up  DOCUMENTATION CODES:   Morbid obesity  INTERVENTION:   Tube Feeding via Cortrak:  Vital High Protein at 70 ml/hr This provides 147 g of protein, 1680 kcals, 1411 mL of free water  Consider additional free water flushes to meet hydration needs  NUTRITION DIAGNOSIS:   Inadequate oral intake related to acute illness as evidenced by NPO status.  Being addressed via TF  GOAL:   Patient will meet greater than or equal to 90% of their needs  Met  MONITOR:   Vent status, Labs, Weight trends, TF tolerance  REASON FOR ASSESSMENT:   Consult, Ventilator Enteral/tube feeding initiation and management  ASSESSMENT:   56 yo female admitted from [facility] with bleeding from around new trach, acute respiratory failure requiring vent support. PMH includes CHF EF 55-60%, CAD, COPD, HLD, HTN, diabetes, chronic back pain  7/08 Admitted from [facility] with profuse bleeding via trach, packed 2x by ENT 7/10 ENT: OR to control trach hemorrhage  Pt alert on vent support via trach  Pt stable to return to [facility] but currently appealing insurance denial; noted [facility] thinks possible decision regarding this by 7/22  Noted SLP following, pt not ready for swallow eval yet  Tolerating Vital High Protein at 65 ml/hr via Cortrak  Noted weight of 150.9 kg on 7/09 but weight since have been much higher. Weight of 7/11 181.9 kg; currently down to 173.3 kg.   UOP 4.5 L in 24 hours; net + 5L  +stool  Labs: CBGs 128-170, Creatinine wdl, sodium wdl Meds: ss novolog, novolog q 4 hours, semglee, lasix, midodrine, H68, KCl, folic acid    Diet Order:   Diet Order             Diet NPO time specified  Diet effective now                   EDUCATION NEEDS:   Not appropriate for education at this time  Skin:  Skin Assessment: Skin Integrity Issues: Skin Integrity Issues:: DTI DTI: L heel Incisions: new trach-ENT evaluated for hemorrhage Other: fluid filled  blister along skin fold-mid sacrum  Last BM:  7/19 rectal tube removed  Height:   Ht Readings from Last 1 Encounters:  04/17/22 5' 5"  (1.651 m)    Weight:   Wt Readings from Last 1 Encounters:  04/19/22 (!) 173.3 kg    BMI:  Body mass index is 63.57 kg/m.  Estimated Nutritional Needs:   Kcal:  1500-1700 kcals  Protein:  125-145 g  Fluid:  >/= 1.5 L  Kerman Passey MS, RDN, LDN, CNSC Registered Dietitian 3 Clinical Nutrition RD Pager and On-Call Pager Number Located in [place]

## 2022-04-19 NOTE — Progress Notes (Signed)
NAME:  Chelsea [name], MRN:  [id number], DOB:  1966-01-20, LOS: 92 ADMISSION DATE:  04/07/2022, CONSULTATION DATE:  04/07/2022 REFERRING MD:  Dr. Francia Greaves, CHIEF COMPLAINT:  Bleeding trach    History of Present Illness:  TIFFIANY [name] is a 56 y.o. female who resides at [facility] with history of CHF with CAD, COPD, OSA, atrial fibrillation HLD, HTN, diabetes, and chronic back pain who presented to the ED from LTAC due to bleeding tracheostomy.  Per family tracheostomy was performed last week at [place].  Daughter reports patient has had significant forceful cough that began overnight and into today which she believes has led to trach bleeding.  Per family patient has not progressed to trach collar trials yet at [place].  On ED arrival patient was seen with mild tachycardia and eventual development of mild hypotension but remained alert and oriented.  Lab work on admission significant for chloride 88, glucose 384, BUN 29, albumin 2.8, AST 10, hemoglobin 8.0, INR 1.7.  ENT was consulted as well as PCCM for management of profusely bleeding tracheostomy.  ENT was able to pack the trach in the emergency department with control and bleeding.  Admitted to ICU for monitoring.   Pertinent Medical History:   CHF with CAD, COPD, atrial fibrillation OSA, HLD, HTN, diabetes, and chronic back pain  Significant Hospital Events: Including procedures, antibiotic start and stop dates in addition to other pertinent events   7/8 presented from [facility] with profusely bleeding tracheostomya 7/10 OR for ligation of bleeding vessel 7/11 sedated on mech vent; bleeding improved from trach; received 2 units prbcs; on 15 mcg levo and vaso 0.03 7/12 restarted heparin; off levo 7/13 black stools appreciated; ifob positive; hgb 6.8 got transfused, 7/14 back on full dose heparin  7/16 No recurrent hemoptysis on heparin 7/18 Tolerating PSV 14/8, FiO2 50%. Peer-to-peer completed for possible return to [facility],  denied due to "pleural effusions" 7/19 Weaning PSV 14/8, FiO2 50%. Trach sutures removed by CCM. 7/20 Weaning PSV 12/8, FiO2 40%. SLP eval for swallow, possible PMV.  Interim History / Subjective:  No significant events overnight Trach sutures removed yesterday uneventfully C/o pain in throat/at stoma site today No other complaints Weaning PSV 12/8, FiO2 40% SLP eval  Objective:  Blood pressure 102/65, pulse 77, temperature 98.2 F (36.8 C), temperature source Oral, resp. rate (!) 21, height '5\' 5"'$  (1.651 m), weight (!) 173.3 kg, SpO2 92 %.    Vent Mode: PSV;CPAP FiO2 (%):  [40 %-50 %] 40 % Set Rate:  [22 bmp] 22 bmp Vt Set:  [450 mL] 450 mL PEEP:  [8 cmH20] 8 cmH20 Pressure Support:  [12 cmH20-14 cmH20] 12 cmH20 Plateau Pressure:  [19 cmH20-25 cmH20] 22 cmH20   Intake/Output Summary (Last 24 hours) at 04/19/2022 0834 Last data filed at 04/19/2022 0700 Gross per 24 hour  Intake 1855 ml  Output 4390 ml  Net -2535 ml    Filed Weights   04/17/22 0530 04/18/22 0500 04/19/22 0500  Weight: (!) 177.4 kg (!) 178.7 kg (!) 173.3 kg   Physical Examination: General: Chronically ill-appearing middle-aged woman in NAD. Appears mildly uncomfortable. HEENT: [place]/AT, anicteric sclera, PERRL, moist mucous membranes. Tracheostomy in place with surrounding pink, macerated tissue. Mild deviation of trach to L due to vent tubing/position. Neuro: Awake, oriented x 4. Responds to verbal stimuli. Following commands consistently. Moves all 4 extremities spontaneously. +Cough and +Gag  CV: RRR, III/VI systolic murmur heard best over RUSB PULM: Breathing even and unlabored on vent (PSV 12/8,  FiO2 40%). Lung fields coarse in upper fields. GI: Obese, soft, nontender, nondistended. Normoactive bowel sounds. Extremities: Bilateral symmetric 2+ pitting chronic LE edema noted. Skin: Warm/dry, venous stasis LE changes bilaterally.  Resolved Hospital Problem List:  Shock (resolved) Sepsis  resolved  Assessment & Plan:   Hemoptysis due to bleeding from tracheostomy site S/p ENT ligation 7/10 [place] Baseman). - Appreciate ENT assistance - Trach sutures removed x4 7/19 without issue - Trach care per protocol - Monitor for further bleeding, secretions - Avoid over suctioning, suction trauma  OSA with OHS  Chronic hypoxic respiratory failure now status post percutaneous tracheostomy with vent dependence - Continue full vent support (4-8cc/kg IBW) - Continues to tolerate vent wean PSV 12/8, FiO2 40% - Wean FiO2 for O2 sat > 90% - Daily WUA/SBT - VAP bundle - Pulmonary hygiene - Intermittent CXR - Diuresis as tolerated for edema, no significant pleural effusions present on CXR 7/19  Enterococcus Faecalis in resp culture, suspect colonizer  Suspected colonizer. - Trend CBC - Monitor of of antibiotics - Repeat Res Cx pending 7/16, rare gram positive cocci in pairs, rare yeast  Acute blood loss anemia, anemia of chronic disease Possible GI bleed versus swallowed blood from tracheal bleeding. - PPI BID to daily - Eliquis per pharmacy, resumed 7/11 - Trend H&H - Monitor for signs of active bleeding - Transfuse for Hgb < 7.0 or hemodynamically significant bleeding  History of diastolic congestive heart failure Chronic atrial fibrillation anticoagulated with Eliquis History of hypertension/hyperlipidemia Moderate aortic stenosis Chronic Hypotension - Continue midodrine - Outpatient f/u for AS - Cardiac monitoring  Type 2 Diabetes - CBGs Q4H, goal 140-180 - Basal glargine - TF coverage Q4H + SSI  Hypothyroidism  - Continue Synthroid  Morbid Obesity - Encourage long-term modest weight loss  At risk for malnutrition - Appreciate Nutrition/RD assistance - TF per Nutrition  Urinary retention, present on admission - Foley for ongoing urinary retention  Deconditioning - PT/OT/SLP, appreciate assistance - PMV when able to tolerate  Best Practice (right click and  "Reselect all SmartList Selections" daily)  Diet/type: tubefeeds  DVT prophylaxis: systemic heparin GI prophylaxis: PPI Lines: Central line Foley:  Yes, and it is still needed - chronic foley Code Status:  full code Last date of multidisciplinary goals of care discussion: 7/12 Dewayne updated over phone. Called Dewayne for update 7/17, no answer but message left for return call. Will reattempt 7/19.  Ongoing attempts to transition patient back to [facility], pending likely need for additional peer-to-peer review.  Critical Care Time:   The patient is critically ill with multiple organ system failure and requires high complexity decision making for assessment and support, frequent evaluation and titration of therapies, advanced monitoring, review of radiographic studies and interpretation of complex data.   Critical Care Time devoted to patient care services, exclusive of separately billable procedures, described in this note is 36 minutes.  Lestine Mount, PA-C [facility] 04/19/22 8:34 AM  Please see [email or web address] for pager details.  From 7A-7P if no response, please call [phone] After hours, please call ELink [phone]

## 2022-04-19 NOTE — Progress Notes (Signed)
SLP Cancellation Note  Patient Details Name: Chelsea [name] MRN: [id number] DOB: [birth year]   Cancelled treatment:        Attempted to see pt for swallowing evaluation.  Pt is on vent at this time. Discussed pt with RN and PA.  Will place orders for PMV as well.  Pt had been weaning all day yesterday with goal of trach collar.  SLP will reattempt evaluation to include PMV assessment when pt is on TCT.   Celedonio Savage, MA, [place] Office: [phone] 04/19/2022, 10:41 AM

## 2022-04-19 NOTE — TOC Progression Note (Signed)
Transition of Care [facility]) - Progression Note    Patient Details  Name: KAIRIE VANGIESON MRN: [id number] Date of Birth: Nov 13, 1965  [facility]) CM/SW Contact  Graves-Bigelow, Ocie Cornfield, RN Phone Number: 04/19/2022, 3:46 PM  Clinical Narrative:  Expedited Insurance Appeal pending at this time. [facility] Liaison thinks we should have determination by 7-22. Case Manager will continue to follow for additional needs.   Expected Discharge Plan: Long Term Acute Care (LTAC) Barriers to Discharge: Continued Medical Work up  Expected Discharge Plan and Services Expected Discharge Plan: Long Term Acute Care (LTAC)   Discharge Planning Services: CM Consult Post Acute Care Choice: Long Term Acute Care (LTAC) Living arrangements for the past 2 months: [place] ([facility])                   Readmission Risk Interventions    04/09/2022    4:17 PM  Readmission Risk Prevention Plan  Transportation Screening Complete  PCP or Specialist Appt within 3-5 Days Complete  HRI or Home Care Consult Complete  Social Work Consult for [place] Planning/Counseling Complete  Palliative Care Screening Not Applicable  Medication Review Press photographer) Referral to Pharmacy

## 2022-04-20 DIAGNOSIS — I5032 Chronic diastolic (congestive) heart failure: Secondary | ICD-10-CM

## 2022-04-20 DIAGNOSIS — R049 Hemorrhage from respiratory passages, unspecified: Secondary | ICD-10-CM | POA: Diagnosis not present

## 2022-04-20 DIAGNOSIS — J9622 Acute and chronic respiratory failure with hypercapnia: Secondary | ICD-10-CM | POA: Diagnosis not present

## 2022-04-20 DIAGNOSIS — J9621 Acute and chronic respiratory failure with hypoxia: Secondary | ICD-10-CM | POA: Diagnosis not present

## 2022-04-20 LAB — MAGNESIUM: Magnesium: 2 mg/dL (ref 1.7–2.4)

## 2022-04-20 LAB — GLUCOSE, CAPILLARY
Glucose-Capillary: 128 mg/dL — ABNORMAL HIGH (ref 70–99)
Glucose-Capillary: 155 mg/dL — ABNORMAL HIGH (ref 70–99)
Glucose-Capillary: 159 mg/dL — ABNORMAL HIGH (ref 70–99)
Glucose-Capillary: 173 mg/dL — ABNORMAL HIGH (ref 70–99)
Glucose-Capillary: 180 mg/dL — ABNORMAL HIGH (ref 70–99)
Glucose-Capillary: 199 mg/dL — ABNORMAL HIGH (ref 70–99)
Glucose-Capillary: 204 mg/dL — ABNORMAL HIGH (ref 70–99)

## 2022-04-20 LAB — BASIC METABOLIC PANEL
Anion gap: 13 (ref 5–15)
BUN: 16 mg/dL (ref 6–20)
CO2: 35 mmol/L — ABNORMAL HIGH (ref 22–32)
Calcium: 8.7 mg/dL — ABNORMAL LOW (ref 8.9–10.3)
Chloride: 92 mmol/L — ABNORMAL LOW (ref 98–111)
Creatinine, Ser: 0.5 mg/dL (ref 0.44–1.00)
GFR, Estimated: >60 mL/min (ref >60)
Glucose, Bld: 151 mg/dL — ABNORMAL HIGH (ref 70–99)
Potassium: 3.8 mmol/L (ref 3.5–5.1)
Sodium: 140 mmol/L (ref 135–145)

## 2022-04-20 MED ORDER — FUROSEMIDE 10 MG/ML IJ SOLN
40.0000 mg | Freq: Two times a day (BID) | INTRAMUSCULAR | Status: DC
  Administered 2022-04-20 – 2022-04-22 (×5): 40 mg via INTRAVENOUS
  Filled 2022-04-20 (×5): qty 4

## 2022-04-20 MED ORDER — POTASSIUM CHLORIDE 20 MEQ PO PACK
20.0000 meq | PACK | Freq: Once | ORAL | Status: AC
Start: 2022-04-20 — End: 2022-04-20
  Administered 2022-04-20: 20 meq
  Filled 2022-04-20: qty 1

## 2022-04-20 NOTE — Evaluation (Signed)
Clinical/Bedside Swallow Evaluation Patient Details  Name: Chelsea [name] MRN: [id number] Date of Birth: [birth year]  Today's Date: 04/20/2022 Time: SLP Start Time (ACUTE ONLY): 1200 SLP Stop Time (ACUTE ONLY): 1210 SLP Time Calculation (min) (ACUTE ONLY): 10 min  Past Medical History:  Past Medical History:  Diagnosis Date   Aortic stenosis    Echocardiogram 02/21/11:  Mean gradient 23 mm of mercury; peak gradient 36;; Turbulence noted in the area of the aortic isthmus with peak gradient 23 mmHg-consider MRI to assess for coarctation;    TEE: 04/02/11:  EF 55-60%, mod BAE, trileaflet AV with mild AS (pk and mean 25 and 16), mild AI, mild MR, mild TR, atrial septal aneurysm, no evidence of corarctation   Arthritis    "qwhere" (12/05/2017)   Asthma    Cervical cancer ([place]) 2006   CHF (congestive heart failure) (HCC)    Chronic lower back pain    Complication of anesthesia    "I have a hard time waking up from under it" (12/05/2017)   COPD (chronic obstructive pulmonary disease) ([place])    "lung dr said I don't have this" (12/05/2017)   Coronary artery disease    Diastolic congestive heart failure ([place])    Echo 02/21/11: EF 55-60%, moderate LVH, grade 2 diastolic dysfunction, mild to moderate aortic stenosis with a mean gradient 23 mm of mercury   GERD (gastroesophageal reflux disease)    Heart murmur    Hepatitis C    History of gout X 1   Hyperlipidemia    Hypertension    Hypotension arterial 04/07/2022   Hypothyroid    IBS (irritable bowel syndrome)    Migraine    "monthly" (12/05/2017)   Obesity hypoventilation syndrome ([place])    On home oxygen therapy    "2L all the time" (12/05/2017)   OSA treated with BiPAP    "have CPAP at home too; wearing BiPAP right now" (12/05/2017)   Pneumonia    "several times" (12/05/2017)   Type II diabetes mellitus ([place])    Past Surgical History:  Past Surgical History:  Procedure Laterality Date   ABDOMINAL SURGERY  2006 X 2   "TAH incision was  infected"   CHOLECYSTECTOMY OPEN     TONSILLECTOMY     TOTAL ABDOMINAL HYSTERECTOMY  2006   TRACHEOSTOMY REVISION N/A 04/09/2022   Procedure: CONTROL OF BLEEDING,TRACHEOSTOMY SITE;  Surgeon: Melida Quitter, MD;  Location: [place];  Service: ENT;  Laterality: N/A;   HPI:  Chelsea [name] is a 56 y.o. female who resides at [facility] who presented to the ED from LTAC due to bleeding tracheostomy.  Per family tracheostomy was performed last week at [place].  Daughter reports patient has had significant forceful cough that began overnight and into today which she believes has led to trach bleeding.  Per family patient has not progressed to trach collar trials yet at [place].     ENT was consulted as well as PCCM for management of profusely bleeding tracheostomy.  ENT was able to pack the trach in the emergency department with control and bleeding.  Admitted to ICU for monitoring.  Pt with history of CHF with CAD, COPD, OSA, atrial fibrillation HLD, HTN, diabetes, and chronic back pain.    Assessment / Plan / Recommendation  Clinical Impression  Pt presents with increased risk of silent aspiration in setting of VDRF with tracheostomy.  Pt tolerated all consistencies trialed today, including straw sips of thin liquid; however, a concern for prandial aspiration still  exists.  Pt on TCT today for first time since tracheostomy placement at [facility] and seen with PMSV in place which she tolerated from 49 minutes with during SLP and PT treatment. Pt was seated upright on edge of bed for PO trials.  Pt exhibited good oral clearance of solids.  Recommend instrumental assessment prior to intiation of PO diet.  Pt agreeable to FEES next date.    Recommend pt remain NPO with temporary alternate means of nutrition, hydration, and medication pending results of instrumental swallow evaluation. Pt may have ice chips and small sips of water for comfort in moderation, after good oral care, when fully awake/alert, with  upright positioning and supervision.  SLP Visit Diagnosis: Dysphagia, unspecified (R13.10)    Aspiration Risk  Moderate aspiration risk    Diet Recommendation NPO;Alternative means - temporary        Other  Recommendations      Recommendations for follow up therapy are one component of a multi-disciplinary discharge planning process, led by the attending physician.  Recommendations may be updated based on patient status, additional functional criteria and insurance authorization.  Follow up Recommendations Acute inpatient rehab (3hours/day)      Assistance Recommended at Discharge Intermittent Supervision/Assistance  Functional Status Assessment Patient has had a recent decline in their functional status and demonstrates the ability to make significant improvements in function in a reasonable and predictable amount of time.  Frequency and Duration min 2x/week  2 weeks       Prognosis Prognosis for Safe Diet Advancement: Good      Swallow Study   General HPI: ARYAH [name] is a 56 y.o. female who resides at [facility] who presented to the ED from LTAC due to bleeding tracheostomy.  Per family tracheostomy was performed last week at [place].  Daughter reports patient has had significant forceful cough that began overnight and into today which she believes has led to trach bleeding.  Per family patient has not progressed to trach collar trials yet at [place].     ENT was consulted as well as PCCM for management of profusely bleeding tracheostomy.  ENT was able to pack the trach in the emergency department with control and bleeding.  Admitted to ICU for monitoring.  Pt with history of CHF with CAD, COPD, OSA, atrial fibrillation HLD, HTN, diabetes, and chronic back pain. Type of Study: Bedside Swallow Evaluation Diet Prior to this Study: NPO Temperature Spikes Noted: No Respiratory Status: Trach Collar History of Recent Intubation: No Behavior/Cognition:  Alert;Cooperative;Pleasant mood Oral Cavity Assessment: Within Functional Limits Oral Care Completed by SLP: No Oral Cavity - Dentition: Adequate natural dentition Vision: Functional for self-feeding Self-Feeding Abilities: Needs assist Patient Positioning: Upright in bed Baseline Vocal Quality: Breathy;Hoarse;Low vocal intensity Volitional Cough: Strong Volitional Swallow: Able to elicit    Oral/Motor/Sensory Function Overall Oral Motor/Sensory Function: Within functional limits Facial ROM: Within Functional Limits Facial Symmetry: Within Functional Limits Lingual ROM: Within Functional Limits Lingual Symmetry: Within Functional Limits Lingual Strength: Within Functional Limits Velum: Within Functional Limits Mandible: Within Functional Limits   Ice Chips Ice chips: Within functional limits   Thin Liquid Thin Liquid: Within functional limits Presentation: Straw;Spoon    Nectar Thick Nectar Thick Liquid: Not tested   Honey Thick Honey Thick Liquid: Not tested   Puree Puree: Within functional limits Presentation: Spoon   Solid     Solid: Within functional limits      Celedonio Savage, [place], [place] Office: [phone] 04/20/2022,3:27 PM

## 2022-04-20 NOTE — Progress Notes (Signed)
Pt was placed on trach collar 8L/35%. Pt is tolerating well at this time.Rt will monitor.

## 2022-04-20 NOTE — Progress Notes (Signed)
NAME:  Chelsea [name], MRN:  [id number], DOB:  06-13-1966, LOS: 50 ADMISSION DATE:  04/07/2022, CONSULTATION DATE:  04/07/2022 REFERRING MD:  Dr. Francia Greaves, CHIEF COMPLAINT:  Bleeding trach    History of Present Illness:  Chelsea [name] is a 56 y.o. female who resides at [facility] with history of CHF with CAD, COPD, OSA, atrial fibrillation HLD, HTN, diabetes, and chronic back pain who presented to the ED from LTAC due to bleeding tracheostomy.  Per family tracheostomy was performed last week at [place].  Daughter reports patient has had significant forceful cough that began overnight and into today which she believes has led to trach bleeding.  Per family patient has not progressed to trach collar trials yet at [place].  ENT was consulted as well as PCCM for management of profusely bleeding tracheostomy.  ENT was able to pack the trach in the emergency department with control and bleeding.  Admitted to ICU for monitoring.   Pertinent Medical History:   CHF with CAD, COPD, atrial fibrillation OSA, HLD, HTN, diabetes, and chronic back pain  Significant Hospital Events: Including procedures, antibiotic start and stop dates in addition to other pertinent events   7/8 presented from [facility] with profusely bleeding tracheostomya 7/10 OR for ligation of bleeding vessel 7/11 sedated on mech vent; bleeding improved from trach; received 2 units prbcs; on 15 mcg levo and vaso 0.03 7/12 restarted heparin; off levo 7/13 black stools appreciated; ifob positive; hgb 6.8 got transfused, 7/14 back on full dose heparin  7/16 No recurrent hemoptysis on heparin 7/18 Tolerating PSV 14/8, FiO2 50%. Peer-to-peer completed for possible return to [facility], denied due to "pleural effusions" 7/19 Weaning PSV 14/8, FiO2 50%. Trach sutures removed by CCM. 7/20 Weaning PSV 12/8, FiO2 40%. SLP eval for swallow, possible PMV.  Interim History / Subjective:   Chronic critically ill, Weaning on pressure  support 10/8 Denies pain or shortness of breath Afebrile Good urine output but remains in positive balance 4 L  Objective:  Blood pressure 93/64, pulse 61, temperature 98.2 F (36.8 C), temperature source Oral, resp. rate 16, height '5\' 5"'$  (1.651 m), weight (!) 167.2 kg, SpO2 (!) 89 %.    Vent Mode: PSV;CPAP FiO2 (%):  [40 %] 40 % Set Rate:  [22 bmp] 22 bmp Vt Set:  [450 mL] 450 mL PEEP:  [8 cmH20] 8 cmH20 Pressure Support:  [10 EXN17-00 cmH20] 10 cmH20 Plateau Pressure:  [15 cmH20] 15 cmH20   Intake/Output Summary (Last 24 hours) at 04/20/2022 0757 Last data filed at 04/20/2022 0700 Gross per 24 hour  Intake 1593.34 ml  Output 3130 ml  Net -1536.66 ml    Filed Weights   04/18/22 0500 04/19/22 0500 04/20/22 0500  Weight: (!) 178.7 kg (!) 173.3 kg (!) 167.2 kg   Physical Examination: General: Chronically ill-appearing middle-aged woman in NAD. Appears mildly uncomfortable. HEENT: [place]/AT, anicteric sclera, PERRL, moist mucous membranes.   Prox XLT tracheostomy in place with surrounding pink, macerated tissue. Mild deviation of trach to L due to vent tubing/position. Neuro: Alert, interactive, nonfocal, can mouth words CV: RRR, III/VI systolic murmur heard best over RUSB PULM: No accessory muscle use, decreased breath sounds bilateral GI: Obese, soft, nontender, nondistended. Normoactive bowel sounds. Extremities: Bilateral symmetric 2+ pitting chronic LE edema noted. Skin: Warm/dry, venous stasis LE changes bilaterally.  Labs show mild hypokalemia, stable anemia, no leukocytosis Chest x-ray 7/19 shows stable bibasilar Opacities  Resolved Hospital Problem List:  Shock (resolved) Sepsis resolved  Assessment & Plan:  Hemoptysis due to bleeding from tracheostomy site S/p ENT ligation 7/10 [place] Baseman). - Appreciate ENT assistance - Trach sutures removed  7/19 without issue - Trach care per protocol - Monitor for further bleeding, secretions - Avoid over suctioning, suction  trauma  OSA with OHS  Chronic hypoxic respiratory failure now status post percutaneous tracheostomy with vent dependence - Continue full vent support (4-8cc/kg IBW) -Continue weaning trials with goal of trach collar daytime and  vent at night - Wean FiO2 for O2 sat > 90% - Daily WUA/SBT - VAP bundle - Pulmonary hygiene - Intermittent CXR -  Enterococcus Faecalis in resp culture 7/11, suspect colonizer  Suspected colonizer. - Repeat Res Cx 7/16 neg -Not treating unless she develops infiltrates or secretions  Acute blood loss anemia, anemia of chronic disease Possible GI bleed versus swallowed blood from tracheal bleeding. - PPI BID to daily - Eliquis per pharmacy, resumed 7/11 - Trend H&H - Monitor for signs of active bleeding - Transfuse for Hgb < 7.0 or hemodynamically significant bleeding  History of diastolic congestive heart failure Chronic atrial fibrillation anticoagulated with Eliquis History of hypertension/hyperlipidemia Moderate aortic stenosis Chronic Hypotension - Continue midodrine - Outpatient f/u for AS   Type 2 Diabetes - CBGs Q4H, goal 140-180 - Basal glargine 20 U - TF coverage Q4H + SSI  Hypothyroidism  - Continue Synthroid  Morbid Obesity - Encourage long-term modest weight loss - TF per Nutrition  Urinary retention, present on admission - Foley - consider dc  Deconditioning - PT/OT/SLP, appreciate assistance - PMV when able to tolerate  Best Practice (right click and "Reselect all SmartList Selections" daily)  Diet/type: tubefeeds  DVT prophylaxis: DOAC GI prophylaxis: PPI Lines: N/A Foley:  Yes, and it is still needed - chronic foley Code Status:  full code Last date of multidisciplinary goals of care discussion: Updated patient and husband  Ongoing attempts to transition patient back to [facility], expedited insurance appeal pending  Critical Care Time:   The patient is critically ill with multiple organ system failure and  requires high complexity decision making for assessment and support, frequent evaluation and titration of therapies, advanced monitoring, review of radiographic studies and interpretation of complex data.   Critical Care Time devoted to patient care services, exclusive of separately billable procedures, described in this note is 31 minutes.  Leanna Sato Elsworth Soho, MD [facility] 04/20/22 7:57 AM  Please see [email or web address] for pager details.  From 7A-7P if no response, please call [phone] After hours, please call ELink [phone]

## 2022-04-20 NOTE — Progress Notes (Signed)
Physical Therapy Treatment Patient Details Name: Chelsea [name] MRN: [id number] DOB: 1966-09-27 Today's Date: 04/20/2022   History of Present Illness 56 yo female admitted 7/8 from [facility] due to bleeding from tracheostomy. 7/10 surgical cauterization of wound. PMHx: chronic resp failure (vent dependent), CHF, CAD, COPD, OSA, Afib, HLD, HTN, DM II, morbid obesity, and chronic back pain.    PT Comments    Pt able to be seen in conjunction with SLP with PMSV donned throughout session on 40% trach collar. SpO2 87-95% with brief drop to 84% with initial sitting from supine with increased time for recovery. Cues for breathing technique and balance. Improved sitting balance this session. Encouraged continued assist for mobility during daily pressure relief with nursing.     Recommendations for follow up therapy are one component of a multi-disciplinary discharge planning process, led by the attending physician.  Recommendations may be updated based on patient status, additional functional criteria and insurance authorization.  Follow Up Recommendations  PT at Long-term acute care hospital     Assistance Recommended at Discharge Frequent or constant Supervision/Assistance  Patient can return home with the following Two people to help with walking and/or transfers;Two people to help with bathing/dressing/bathroom;Assistance with cooking/housework;Assistance with feeding;Direct supervision/assist for medications management;Direct supervision/assist for financial management;Assist for transportation;Help with stairs or ramp for entrance   Equipment Recommendations  None recommended by PT    Recommendations for Other Services       Precautions / Restrictions Precautions Precautions: Fall Precaution Comments: trach, sacral wounds, cortrak     Mobility  Bed Mobility Overal bed mobility: Needs Assistance Bed Mobility: Rolling, Supine to Sit, Sit to Supine Rolling: Mod assist   Supine  to sit: Max assist, HOB elevated, +2 for physical assistance Sit to supine: Max assist, +2 for physical assistance   General bed mobility comments: pt with partial roll to left with use of rail with assist to clear legs and elevate trunk into sitting EOB with HOB 25 degrees. EOB 12 min with feet on stool. Min cues for midline posture with tendence for posterior left lean in sitting. Total +2 to slide toward HOB. Mod assist to roll bil for pad placement    Transfers                   General transfer comment: unable at baseline    Ambulation/Gait                   Stairs             Wheelchair Mobility    Modified Rankin (Stroke Patients Only)       Balance Overall balance assessment: Needs assistance   Sitting balance-Leahy Scale: Fair Sitting balance - Comments: EOB 12 min with progression from min to minguard. left posterior lean with fatigue                                    Cognition Arousal/Alertness: Awake/alert Behavior During Therapy: WFL for tasks assessed/performed Overall Cognitive Status: Within Functional Limits for tasks assessed                                          Exercises      General Comments        Pertinent Vitals/Pain Pain Assessment Pain Assessment: No/denies  pain    Home Living                          Prior Function            PT Goals (current goals can now be found in the care plan section) Progress towards PT goals: Progressing toward goals    Frequency    Min 2X/week      PT Plan Current plan remains appropriate    Co-evaluation              AM-PAC PT "6 Clicks" Mobility   Outcome Measure  Help needed turning from your back to your side while in a flat bed without using bedrails?: A Lot Help needed moving from lying on your back to sitting on the side of a flat bed without using bedrails?: Total Help needed moving to and from a bed to a  chair (including a wheelchair)?: Total Help needed standing up from a chair using your arms (e.g., wheelchair or bedside chair)?: Total Help needed to walk in hospital room?: Total Help needed climbing 3-5 steps with a railing? : Total 6 Click Score: 7    End of Session Equipment Utilized During Treatment: Oxygen Activity Tolerance: Patient tolerated treatment well Patient left: in bed;with call bell/phone within reach Nurse Communication: Mobility status;Need for lift equipment PT Visit Diagnosis: Other abnormalities of gait and mobility (R26.89);Muscle weakness (generalized) (M62.81)     Time: 1127-1204 PT Time Calculation (min) (ACUTE ONLY): 37 min  Charges:  $Therapeutic Activity: 23-37 mins                     Chelsea [name], PT Acute Rehabilitation Services Office: [phone]    Chelsea [name] Chelsea [name] 04/20/2022, 12:27 PM

## 2022-04-20 NOTE — Evaluation (Signed)
[facility] Speaking Valve - Evaluation Patient Details  Name: Chelsea [name] MRN: [id number] Date of Birth: Jan 20, 1966  Today's Date: 04/20/2022 Time: 1121-1200 SLP Time Calculation (min) (ACUTE ONLY): 39 min  Past Medical History:  Past Medical History:  Diagnosis Date   Aortic stenosis    Echocardiogram 02/21/11:  Mean gradient 23 mm of mercury; peak gradient 36;; Turbulence noted in the area of the aortic isthmus with peak gradient 23 mmHg-consider MRI to assess for coarctation;    TEE: 04/02/11:  EF 55-60%, mod BAE, trileaflet AV with mild AS (pk and mean 25 and 16), mild AI, mild MR, mild TR, atrial septal aneurysm, no evidence of corarctation   Arthritis    "qwhere" (12/05/2017)   Asthma    Cervical cancer ([place]) 2006   CHF (congestive heart failure) (HCC)    Chronic lower back pain    Complication of anesthesia    "I have a hard time waking up from under it" (12/05/2017)   COPD (chronic obstructive pulmonary disease) ([place])    "lung dr said I don't have this" (12/05/2017)   Coronary artery disease    Diastolic congestive heart failure ([place])    Echo 02/21/11: EF 55-60%, moderate LVH, grade 2 diastolic dysfunction, mild to moderate aortic stenosis with a mean gradient 23 mm of mercury   GERD (gastroesophageal reflux disease)    Heart murmur    Hepatitis C    History of gout X 1   Hyperlipidemia    Hypertension    Hypotension arterial 04/07/2022   Hypothyroid    IBS (irritable bowel syndrome)    Migraine    "monthly" (12/05/2017)   Obesity hypoventilation syndrome ([place])    On home oxygen therapy    "2L all the time" (12/05/2017)   OSA treated with BiPAP    "have CPAP at home too; wearing BiPAP right now" (12/05/2017)   Pneumonia    "several times" (12/05/2017)   Type II diabetes mellitus ([place])    Past Surgical History:  Past Surgical History:  Procedure Laterality Date   ABDOMINAL SURGERY  2006 X 2   "TAH incision was infected"   CHOLECYSTECTOMY OPEN     TONSILLECTOMY      TOTAL ABDOMINAL HYSTERECTOMY  2006   TRACHEOSTOMY REVISION N/A 04/09/2022   Procedure: CONTROL OF BLEEDING,TRACHEOSTOMY SITE;  Surgeon: Melida Quitter, MD;  Location: [facility];  Service: ENT;  Laterality: N/A;   HPI:  Chelsea [name] is a 56 y.o. female who resides at [facility] who presented to the ED from LTAC due to bleeding tracheostomy.  Per family tracheostomy was performed last week at [place].  Daughter reports patient has had significant forceful cough that began overnight and into today which she believes has led to trach bleeding.  Per family patient has not progressed to trach collar trials yet at [place].     ENT was consulted as well as PCCM for management of profusely bleeding tracheostomy.  ENT was able to pack the trach in the emergency department with control and bleeding.  Admitted to ICU for monitoring.  Pt with history of CHF with CAD, COPD, OSA, atrial fibrillation HLD, HTN, diabetes, and chronic back pain.    Assessment / Plan / Recommendation  Clinical Impression  Pt seen for PMSV trial following trach placement at [facility] [place].  Pt able to phonate around tracheostomy with cuff deflated.  Cuff deflated by RT prior to SLP arrival. Pt able to phonate to finger occlusion.  Pt tolerated 5 breath placement with  no change in vitals and no backpressure noted.  PMSV was in place for remainder of 49 minute session including swallowing evaluation.  Stable vital signs no discomfort.  Pt was able to speak clearly and intelligibly enough to call her husband and wish him happy birthday over the phone.  He requested repetition once or twice 2/2 decreased vocal intensity.  Pt communicating and sentence level with valve in place.  Pt continued to wear valve during cotreat with PT who was able to get pt up to sitting on edge of bed.  O2 sats dropped with exertion to low-mid 80s but quickly rebounded with PT encouragement to take deep breaths.  Pt reports that it is typical for her to desat with such  efforts at baseline.  She denied shortness of breath or discomfort with valve placement during physical activity.  Pt expectorating into yankauer for secretion management. No coughing observed and pt denied bringing anything up to oral cavity to spit out. Pt was unable to independently doff PMSV and will need indirect supervision with valve placement.  Provided education and hung signs regarding PMV use with cuffed tracheostomy.    Pt may wear PMV during all waking hours with intermittent supervision.  Please remove valve when sleeping.  Recommend changing to cuffless when medically appropriate.  SLP Visit Diagnosis: Aphonia (R49.1)    SLP Assessment  Patient needs continued Speech [place] [facility]    Recommendations for follow up therapy are one component of a multi-disciplinary discharge planning process, led by the attending physician.  Recommendations may be updated based on patient status, additional functional criteria and insurance authorization.  Follow Up Recommendations  Acute inpatient rehab (3hours/day)    Assistance Recommended at Discharge Intermittent Supervision/Assistance  Functional Status Assessment Patient has had a recent decline in their functional status and demonstrates the ability to make significant improvements in function in a reasonable and predictable amount of time.  Frequency and Duration min 2x/week  2 weeks    PMSV Trial PMSV was placed for: 50 minutes Able to redirect subglottic air through upper airway: Yes Able to Attain Phonation: Yes Voice Quality: Low vocal intensity Able to Expectorate Secretions: Yes Level of Secretion Expectoration with PMSV: Oral Breath Support for Phonation: Adequate Intelligibility: Intelligible Respirations During Trial: 16 SpO2 During Trial: 90 % (decreased with exertion but rebounded) Pulse During Trial: 76 Behavior: Alert;Controlled;Cooperative;Expresses self well;Good eye contact;Responsive to questions    Tracheostomy Tube       Vent Dependency  FiO2 (%): 40 %    Cuff Deflation Trial Tolerated Cuff Deflation: Yes Length of Time for Cuff Deflation Trial: 50 minutes + Behavior: Alert;Cooperative;Expresses self well;Good eye contact;Responsive to questions         Celedonio Savage, [place], [place] Office: [phone] 04/20/2022, 3:20 PM

## 2022-04-21 ENCOUNTER — Inpatient Hospital Stay (HOSPITAL_COMMUNITY): Payer: Medicare Other

## 2022-04-21 LAB — GLUCOSE, CAPILLARY
Glucose-Capillary: 136 mg/dL — ABNORMAL HIGH (ref 70–99)
Glucose-Capillary: 140 mg/dL — ABNORMAL HIGH (ref 70–99)
Glucose-Capillary: 144 mg/dL — ABNORMAL HIGH (ref 70–99)
Glucose-Capillary: 198 mg/dL — ABNORMAL HIGH (ref 70–99)
Glucose-Capillary: 210 mg/dL — ABNORMAL HIGH (ref 70–99)
Glucose-Capillary: 213 mg/dL — ABNORMAL HIGH (ref 70–99)

## 2022-04-21 LAB — BASIC METABOLIC PANEL
Anion gap: 9 (ref 5–15)
BUN: 14 mg/dL (ref 6–20)
CO2: 36 mmol/L — ABNORMAL HIGH (ref 22–32)
Calcium: 8.5 mg/dL — ABNORMAL LOW (ref 8.9–10.3)
Chloride: 92 mmol/L — ABNORMAL LOW (ref 98–111)
Creatinine, Ser: 0.53 mg/dL (ref 0.44–1.00)
GFR, Estimated: >60 mL/min (ref >60)
Glucose, Bld: 172 mg/dL — ABNORMAL HIGH (ref 70–99)
Potassium: 4 mmol/L (ref 3.5–5.1)
Sodium: 137 mmol/L (ref 135–145)

## 2022-04-21 MED ORDER — ORAL CARE MOUTH RINSE
15.0000 mL | OROMUCOSAL | Status: DC
  Administered 2022-04-21 – 2022-04-22 (×7): 15 mL via OROMUCOSAL

## 2022-04-21 MED ORDER — ORAL CARE MOUTH RINSE: 15.0000 mL | OROMUCOSAL | Status: DC | PRN

## 2022-04-21 MED ORDER — ALPRAZOLAM 0.25 MG PO TABS
0.2500 mg | ORAL_TABLET | Freq: Once | ORAL | Status: AC
  Administered 2022-04-21: 0.25 mg
  Filled 2022-04-21: qty 1

## 2022-04-21 NOTE — Progress Notes (Signed)
[facility] Progress Note Patient Name: Chelsea [name] DOB: [birth year] MRN: [id number]   Date of Service  04/21/2022  HPI/Events of Note  Anxiety. Was given medications at [place] but unclear which. Awake and alert on camera, writing on a pad. BMI is 63  eICU Interventions  Low dose Xanax x 1      Intervention Category Minor Interventions: Routine modifications to care plan (e.g. PRN medications for pain, fever)  Vinette Crites G Kasai Beltran 04/21/2022, 10:35 PM

## 2022-04-21 NOTE — Progress Notes (Signed)
Modified Barium Swallow Progress Note  Patient Details  Name: Chelsea [name] MRN: [id number] Date of Birth: [birth year]  Today's Date: 04/21/2022  Modified Barium Swallow completed.  Full report located under Chart Review in the Imaging Section.  Brief recommendations include the following:  Clinical Impression  Pt presents with a very mild pharyngeal dysphagia.  There was intermittent penetration of thin liquid.  Penetration was largely transient, but occasionally a small amount would remain on the interior surface of epiglottis.  Single, small sips prevented penetration.  There was no penetration of puree or solid textures and pt exhibited good oral and pharyngeal clearance of solid textures.  There was prompt oral transit of barium tablet during pill simulation with liquid was with no pharyngeal stasis.  Esophageal sweep could not be performed, but there was no retention of contrast in cervical esophagus with any consistencies noted today.    Recommend regular texture diet with small, single sips of thin liquid.   Swallow Evaluation Recommendations       SLP Diet Recommendations: Regular solids;Thin liquid   Liquid Administration via: Cup;Straw   Medication Administration: Whole meds with liquid (1 at a time)       Compensations: Slow rate;Small sips/bites (Single sips)       Oral Care Recommendations: Oral care BID        Celedonio Savage, [place], [place] Office: [phone] 04/21/2022,12:26 PM

## 2022-04-21 NOTE — Plan of Care (Signed)

## 2022-04-21 NOTE — Progress Notes (Signed)
NAME:  Chelsea [name], MRN:  [id number], DOB:  [birth year], LOS: 35 ADMISSION DATE:  04/07/2022, CONSULTATION DATE:  04/07/2022 REFERRING MD:  Dr. Francia Greaves, CHIEF COMPLAINT:  Bleeding trach    History of Present Illness:  Chelsea [name] is a 56 y.o. female who resides at [facility] with history of CHF with CAD, COPD, OSA, atrial fibrillation HLD, HTN, diabetes, and chronic back pain who presented to the ED from LTAC due to bleeding tracheostomy.  Per family tracheostomy was performed last week at [place].  Daughter reports patient has had significant forceful cough that began overnight and into today which she believes has led to trach bleeding.  Per family patient has not progressed to trach collar trials yet at [place].  ENT was consulted as well as PCCM for management of profusely bleeding tracheostomy.  ENT was able to pack the trach in the emergency department with control and bleeding.  Admitted to ICU for monitoring.   Pertinent Medical History:   CHF with CAD, COPD, atrial fibrillation OSA, HLD, HTN, diabetes, and chronic back pain  Significant Hospital Events: Including procedures, antibiotic start and stop dates in addition to other pertinent events   7/8 presented from [facility] with profusely bleeding tracheostomya 7/10 OR for ligation of bleeding vessel 7/11 sedated on mech vent; bleeding improved from trach; received 2 units prbcs; on 15 mcg levo and vaso 0.03 7/12 restarted heparin; off levo 7/13 black stools appreciated; ifob positive; hgb 6.8 got transfused, 7/14 back on full dose heparin  7/16 No recurrent hemoptysis on heparin 7/18 Tolerating PSV 14/8, FiO2 50%. Peer-to-peer completed for possible return to [facility], denied due to "pleural effusions" 7/19 Weaning PSV 14/8, FiO2 50%. Trach sutures removed by CCM. 7/20 Weaning PSV 12/8, FiO2 40%. SLP eval for swallow, possible PMV. 7/21 tolerating trach collar daytime  Interim History / Subjective:   Improving  critically ill. Tolerated trach collar and left on this overnight Diuresed 2.6 L with Lasix  Objective:  Blood pressure 107/64, pulse 74, temperature 98.7 F (37.1 C), temperature source Oral, resp. rate 19, height '5\' 5"'$  (1.651 m), weight (!) 172.8 kg, SpO2 (!) 86 %.    FiO2 (%):  [35 %-40 %] 40 %   Intake/Output Summary (Last 24 hours) at 04/21/2022 0844 Last data filed at 04/21/2022 0600 Gross per 24 hour  Intake 1740 ml  Output 2470 ml  Net -730 ml    Filed Weights   04/19/22 0500 04/20/22 0500 04/21/22 0500  Weight: (!) 173.3 kg (!) 175.1 kg (!) 172.8 kg   Physical Examination: General: Chronically ill-appearing middle-aged woman in NAD.  HEENT: [place]/AT, anicteric sclera, PERRL, moist mucous membranes.   Prox XLT tracheostomy in place with surrounding pink, macerated tissue.  Neuro: Alert, interactive, nonfocal, can mouth words CV: S1-S2 regular, 3/6 ESM at base PULM: Decreased breath sounds bilaterally, no accessory muscle GI: Obese, soft, nontender, nondistended. Normoactive bowel sounds. Extremities: Bilateral symmetric 2+ pitting chronic LE edema noted. Skin: Warm/dry, venous stasis LE changes bilaterally.  Labs show normal electrolytes Chest x-ray 7/19 shows stable bibasilar Opacities  Resolved Hospital Problem List:  Shock (resolved) Sepsis resolved  Assessment & Plan:   Hemoptysis due to bleeding from tracheostomy site S/p ENT ligation 7/10 [facility] Bas[facility]). - Appreciate ENT assistance - Trach sutures removed  7/19 without issue - Trach care per protocol - Monitor for further bleeding, secretions - Avoid over suctioning, suction trauma  OSA with OHS  Chronic hypoxic respiratory failure now status post percutaneous tracheostomy with vent  dependence  -Continue trach collar daytime and  vent at night - VAP bundle - Pulmonary hygiene   Enterococcus Faecalis in resp culture 7/11, suspect colonizer  - Repeat Res Cx 7/16 neg -Not treating unless she develops  infiltrates or secretions  Acute blood loss anemia, anemia of chronic disease Possible GI bleed versus swallowed blood from tracheal bleeding. - PPI BID to daily - Eliquis per pharmacy, resumed 7/11 - Trend H&H - Monitor for signs of active bleeding - Transfuse for Hgb < 7.0 or hemodynamically significant bleeding  History of diastolic congestive heart failure Chronic atrial fibrillation anticoagulated with Eliquis History of hypertension/hyperlipidemia Moderate aortic stenosis Chronic Hypotension - Continue midodrine - Outpatient f/u for AS   Type 2 Diabetes - CBGs Q4H, goal 140-180 - Basal glargine 20 U - TF coverage Q4H + SSI  Hypothyroidism  - Continue Synthroid  Morbid Obesity - Encourage long-term modest weight loss - TF per Nutrition  Urinary retention, present on admission - Foley - consider dc  Deconditioning - PT/OT/SLP, appreciate assistance -Tolerating PMV  Best Practice (right click and "Reselect all SmartList Selections" daily)  Diet/type: tubefeeds  DVT prophylaxis: DOAC GI prophylaxis: PPI Lines: N/A Foley:  Yes, and it is still needed - chronic foley Code Status:  full code Last date of multidisciplinary goals of care discussion: Updated patient and husband  Ongoing attempts to transition patient back to [facility], expedited insurance appeal pending  Critical Care Time:    [name], [place] [facility] 04/21/22 8:44 AM  Please see [email or web address] for pager details.  From 7A-7P if no response, please call [phone] After hours, please call ELink [phone]

## 2022-04-22 DIAGNOSIS — J9621 Acute and chronic respiratory failure with hypoxia: Secondary | ICD-10-CM | POA: Diagnosis not present

## 2022-04-22 DIAGNOSIS — R049 Hemorrhage from respiratory passages, unspecified: Secondary | ICD-10-CM | POA: Diagnosis not present

## 2022-04-22 DIAGNOSIS — I5032 Chronic diastolic (congestive) heart failure: Secondary | ICD-10-CM | POA: Diagnosis not present

## 2022-04-22 DIAGNOSIS — J9501 Hemorrhage from tracheostomy stoma: Secondary | ICD-10-CM | POA: Diagnosis not present

## 2022-04-22 LAB — BASIC METABOLIC PANEL
Anion gap: 9 (ref 5–15)
BUN: 19 mg/dL (ref 6–20)
CO2: 35 mmol/L — ABNORMAL HIGH (ref 22–32)
Calcium: 8.7 mg/dL — ABNORMAL LOW (ref 8.9–10.3)
Chloride: 90 mmol/L — ABNORMAL LOW (ref 98–111)
Creatinine, Ser: 0.58 mg/dL (ref 0.44–1.00)
GFR, Estimated: >60 mL/min (ref >60)
Glucose, Bld: 145 mg/dL — ABNORMAL HIGH (ref 70–99)
Potassium: 3.9 mmol/L (ref 3.5–5.1)
Sodium: 134 mmol/L — ABNORMAL LOW (ref 135–145)

## 2022-04-22 LAB — GLUCOSE, CAPILLARY
Glucose-Capillary: 152 mg/dL — ABNORMAL HIGH (ref 70–99)
Glucose-Capillary: 155 mg/dL — ABNORMAL HIGH (ref 70–99)
Glucose-Capillary: 195 mg/dL — ABNORMAL HIGH (ref 70–99)
Glucose-Capillary: 169 mg/dL — ABNORMAL HIGH (ref 70–99)

## 2022-04-22 MED ORDER — MIDODRINE HCL 10 MG PO TABS
10.0000 mg | ORAL_TABLET | Freq: Three times a day (TID) | ORAL | 0 refills | Status: DC
Start: 2022-04-22 — End: 2022-07-04

## 2022-04-22 MED ORDER — LEVOTHYROXINE SODIUM 150 MCG PO TABS: 150.0000 ug | ORAL_TABLET | Freq: Every day | ORAL | Status: DC

## 2022-04-22 NOTE — Progress Notes (Signed)
NAME:  Chelsea [name], MRN:  [id number], DOB:  Apr 09, 1966, LOS: 88 ADMISSION DATE:  04/07/2022, CONSULTATION DATE:  04/07/2022 REFERRING MD:  Dr. Francia Greaves, CHIEF COMPLAINT:  Bleeding trach    History of Present Illness:  Chelsea [name] is a 56 y.o. female who resides at [facility] with history of CHF with CAD, COPD, OSA, atrial fibrillation HLD, HTN, diabetes, and chronic back pain who presented to the ED from LTAC due to bleeding tracheostomy.  Per family tracheostomy was performed last week at [place].  Daughter reports patient has had significant forceful cough that began overnight and into today which she believes has led to trach bleeding.  Per family patient has not progressed to trach collar trials yet at [place].  ENT was consulted as well as PCCM for management of profusely bleeding tracheostomy.  ENT was able to pack the trach in the emergency department with control and bleeding.  Admitted to ICU for monitoring.   Pertinent Medical History:   CHF with CAD, COPD, atrial fibrillation OSA, HLD, HTN, diabetes, and chronic back pain  Significant Hospital Events: Including procedures, antibiotic start and stop dates in addition to other pertinent events   7/8 presented from [facility] with profusely bleeding tracheostomya 7/10 OR for ligation of bleeding vessel 7/11 sedated on mech vent; bleeding improved from trach; received 2 units prbcs; on 15 mcg levo and vaso 0.03 7/12 restarted heparin; off levo 7/13 black stools appreciated; ifob positive; hgb 6.8 got transfused, 7/14 back on full dose heparin  7/16 No recurrent hemoptysis on heparin 7/18 Tolerating PSV 14/8, FiO2 50%. Peer-to-peer completed for possible return to [facility], denied due to "pleural effusions" 7/19 Weaning PSV 14/8, FiO2 50%. Trach sutures removed by CCM. 7/20 Weaning PSV 12/8, FiO2 40%. SLP eval for swallow, possible PMV. 7/21 tolerating trach collar daytime  Interim History / Subjective:   Improving  critically ill. Tolerated trach collar daytime and vent at night Afebrile  Objective:  Blood pressure 107/67, pulse 86, temperature 97.9 F (36.6 C), temperature source Oral, resp. rate 15, height '5\' 5"'$  (1.651 m), weight (!) 171.9 kg, SpO2 93 %.    Vent Mode: PRVC FiO2 (%):  [40 %] 40 % Set Rate:  [22 bmp] 22 bmp Vt Set:  [450 mL] 450 mL PEEP:  [8 cmH20] 8 cmH20 Plateau Pressure:  [15 cmH20-21 cmH20] 21 cmH20   Intake/Output Summary (Last 24 hours) at 04/22/2022 2979 Last data filed at 04/22/2022 0800 Gross per 24 hour  Intake 1513 ml  Output 1880 ml  Net -367 ml    Filed Weights   04/20/22 0500 04/21/22 0500 04/22/22 0500  Weight: (!) 175.1 kg (!) 172.8 kg (!) 171.9 kg   Physical Examination: General: Chronically ill-appearing middle-aged woman in NAD.  HEENT: [place]/AT, anicteric sclera, PERRL, moist mucous membranes.   Prox XLT tracheostomy in place with surrounding pink, macerated tissue.  Neuro: Able to mouth words, alert, interactive, nonfocal CV: S1-S2 regular, 3/6 ESM at base PULM: Decreased breath sounds bilaterally, no accessory muscle GI: Obese, soft, nontender, nondistended. Normoactive bowel sounds. Extremities: 1+ chronic pitting edema Skin: Warm/dry, venous stasis LE changes bilaterally.  Labs show mild hyponatremia Chest x-ray 7/19 shows stable bibasilar Opacities  Resolved Hospital Problem List:  Shock (resolved) Sepsis resolved  Assessment & Plan:   Hemoptysis due to bleeding from tracheostomy site S/p ENT ligation 7/10 [facility] Baseman). - Appreciate ENT assistance - Trach sutures removed  7/19 without issue - Trach care per protocol - Monitor for further bleeding, secretions -  Avoid over suctioning, suction trauma  OSA with OHS  Chronic hypoxic respiratory failure now status post percutaneous tracheostomy with vent dependence  -Continue trach collar daytime and  vent at night - VAP bundle - Pulmonary hygiene   Enterococcus Faecalis in resp culture  7/11, suspect colonizer  - Repeat Res Cx 7/16 neg -Not treating unless she develops infiltrates or secretions  Acute blood loss anemia, anemia of chronic disease Possible GI bleed versus swallowed blood from tracheal bleeding. - PPI BID to daily - Eliquis per pharmacy, resumed 7/11 -  Monitor for signs of active bleeding - Transfuse for Hgb < 7.0 or hemodynamically significant bleeding  History of diastolic congestive heart failure Chronic atrial fibrillation anticoagulated with Eliquis History of hypertension/hyperlipidemia Moderate aortic stenosis Chronic Hypotension - Continue midodrine - Outpatient f/u for AS   Type 2 Diabetes - CBGs Q4H, goal 140-180 - Basal glargine 20 U - TF coverage Q4H + SSI  Hypothyroidism  - Continue Synthroid  Morbid Obesity - Encourage long-term modest weight loss - TF per Nutrition  Urinary retention, present on admission - Foley - consider dc  Deconditioning - PT/OT/SLP, appreciate assistance -Tolerating PMV  Discharge plan is to transfer patient back to [facility], expedited insurance appeal pending, she is ready to discharge to LTAC from medical standpoint  Best Practice (right click and "Reselect all SmartList Selections" daily)  Diet/type: tubefeeds  DVT prophylaxis: DOAC GI prophylaxis: PPI Lines: N/A Foley:  Yes, and it is still needed - chronic foley Code Status:  full code Last date of multidisciplinary goals of care discussion: Updated patient and husband    Critical Care Time:    [name], [place] [facility] 04/22/22 9:21 AM  Please see [email or web address] for pager details.  From 7A-7P if no response, please call [phone] After hours, please call ELink [phone]

## 2022-04-22 NOTE — Discharge Summary (Signed)
Physician Discharge Summary  Patient ID: Chelsea [name] MRN: [id number] DOB/AGE: 1966/04/14 56 y.o.  Admit date: 04/07/2022 Discharge date: 04/22/2022  Problem List Principal Problem:   Bleeding of the respiratory tract Active Problems:   Obstructive sleep apnea   GERD   Hypothyroidism   Chronic diastolic heart failure (HCC)   Osteoarthritis   Obesity hypoventilation syndrome (HCC)   History of cervical cancer   Neuropathic pain, leg   Hepatitis C   Chronic respiratory failure with hypoxia (HCC)   Chronic pain of multiple joints   Depression   Mixed hyperlipidemia   Neuropathy due to type 2 diabetes mellitus ([place])   Physical debility   Acute on chronic respiratory failure with hypoxia and hypercapnia (HCC)   Anemia   Physical deconditioning   Restrictive lung disease secondary to obesity   Venous insufficiency of both lower extremities   Class 3 severe obesity due to excess calories with serious comorbidity and body mass index (BMI) of 60.0 to 69.9 in adult ([place])   DJD (degenerative joint disease)  HPI:  Chelsea [name] is a 56 y.o. female who resides at [facility] with history of CHF with CAD, COPD, OSA, atrial fibrillation HLD, HTN, diabetes, and chronic back pain who presented to the ED from LTAC due to bleeding tracheostomy.  Per family tracheostomy was performed last week at [place].  Daughter reports patient has had significant forceful cough that began overnight and into today which she believes has led to trach bleeding.  Per family patient has not progressed to trach collar trials yet at [place].  ENT was consulted as well as PCCM for management of profusely bleeding tracheostomy.  ENT was able to pack the trach in the emergency department with control and bleeding.  And will admit and monitor in ICU for continued need of ventilator support  Hospital Course: 7/8 presented from [facility] with profusely bleeding tracheostomy site 7/10 OR for ligation of  bleeding vessel 7/11 sedated on mech vent; bleeding improved from trach; received 2 units prbcs; on 15 mcg levo and vaso 0.03 7/12 restarted heparin; off levo 7/13 black stools appreciated; ifob positive; hgb 6.8 got transfused, 7/14 back on full dose heparin  7/16 No recurrent hemoptysis on heparin 7/18 Tolerating PSV 14/8, FiO2 50%. Peer-to-peer completed for possible return to [facility], denied due to "pleural effusions" 7/19 Weaning PSV 14/8, FiO2 50%. Trach sutures removed by CCM. 7/20 Weaning PSV 12/8, FiO2 40%. SLP eval for swallow, possible PMV. 7/21 tolerating trach collar daytime 7/22 trach collar during the day and vent at night 7/23 bed available at [facility], discharge   Exam at discharge: General: Chronically ill-appearing middle-aged woman in NAD.  HEENT: [place]/AT, anicteric sclera, PERRL, moist mucous membranes.   Prox XLT tracheostomy in place with surrounding pink, macerated tissue.  Neuro: Able to mouth words, alert, interactive, nonfocal CV: S1-S2 regular, 3/6 ESM at base PULM: Decreased breath sounds bilaterally, no accessory muscle GI: Obese, soft, nontender, nondistended. Normoactive bowel sounds. Extremities: 1+ chronic pitting edema Skin: Warm/dry, venous stasis LE changes bilaterally.  Plan at discharge:  Chronic hypoxic respiratory failure  S/p Tracheostomy VDRF Hemoptysis due to bleeding from tracheostomy site, improved  OSA with OHS  Enterococcus faecalis respiratory colonization  -Underwent vessel ligation 7/10 with ENT. Subsequent improvement in bleeding. Trach sutures removed 2/22 without complication -as of 9/79 is tolerating trach collar during the day, and requires vent at night  -repeat respiratory cultures 7/16 were negative  P -avoid suction trauma -pulmonary hygiene, VAP prevention -  continue daytime trach collar, nocturnal vent. Progress as able   Hx diastolic congestive heart failure Chronic Afib on eliquis Moderate Aortic stenosis Chronic  hypotension HLD P -continue midodrine  -outpatient follow up for aortic stenosis  -continue eliquis  -Lasix 40 mg BID -Lipitor 20 mg qD  -okay to resume ASA   Type 2 Diabetes P -Lantus 20 units Daily -consider sliding scale insulin  Hypothyroidism P -synthroid 148mg qD   Urinary retention -chronic foley  -consider urology outpatient    Labs at discharge Lab Results  Component Value Date   CREATININE 0.58 04/22/2022   BUN 19 04/22/2022   NA 134 (L) 04/22/2022   K 3.9 04/22/2022   CL 90 (L) 04/22/2022   CO2 35 (H) 04/22/2022   Lab Results  Component Value Date   WBC 6.5 04/19/2022   HGB 9.1 (L) 04/19/2022   HCT 30.4 (L) 04/19/2022   MCV 90.5 04/19/2022   PLT 234 04/19/2022   Lab Results  Component Value Date   ALT 24 04/07/2022   AST 10 (L) 04/07/2022   ALKPHOS 62 04/07/2022   BILITOT 0.3 04/07/2022   Lab Results  Component Value Date   INR 1.6 (H) 04/09/2022   INR 1.4 (H) 04/07/2022   INR 1.4 (H) 04/07/2022    Current radiology studies No results found.  Disposition: [facility]      Allergies as of 04/22/2022       Reactions   Penicillins Anaphylaxis   Patient tolerates cefepime (08/2017)   Iodinated Contrast Media    Minoxidil    Other reaction(s): hives        Medication List     STOP taking these medications    amLODipine 5 MG tablet Commonly known as: NORVASC   cetirizine 10 MG tablet Commonly known as: ZYRTEC   clonazePAM 0.25 MG disintegrating tablet Commonly known as: KLONOPIN   clonazePAM 0.5 MG tablet Commonly known as: KLONOPIN   DOPAMINE HCL IV   gabapentin 300 MG capsule Commonly known as: NEURONTIN   hydrOXYzine 25 MG tablet Commonly known as: ATARAX   methylPREDNISolone sodium succinate 40 mg/mL injection Commonly known as: SOLU-MEDROL   metoprolol succinate 25 MG 24 hr tablet Commonly known as: TOPROL-XL   MORPHINE SULFATE (PF) IV   NON FORMULARY   oxyCODONE 5 MG immediate release tablet Commonly  known as: Oxy IR/ROXICODONE   traMADol 50 MG tablet Commonly known as: ULTRAM       TAKE these medications    Acetaminophen 650 MG/20.3ML Soln Place 650 mg into feeding tube every 6 (six) hours as needed for mild pain.   albuterol (2.5 MG/3ML) 0.083% nebulizer solution Commonly known as: PROVENTIL Take 3 mLs (2.5 mg total) by nebulization every 6 (six) hours as needed for wheezing or shortness of breath (I50.32). What changed: Another medication with the same name was removed. Continue taking this medication, and follow the directions you see here.   aspirin 81 MG tablet Take 81 mg by mouth daily.   atorvastatin 20 MG tablet Commonly known as: LIPITOR Place 20 mg into feeding tube daily.   budesonide 0.5 MG/2ML nebulizer solution Commonly known as: PULMICORT Inhale 0.5 mg into the lungs every 12 (twelve) hours.   Cholecalciferol 125 MCG (5000 UT) Tabs Place 5,000 Units into feeding tube daily.   dextrose 40 % Gel Commonly known as: GLUTOSE Take 15 g by mouth See admin instructions. 15g by mouth every 1/2 hour as needed.   diclofenac Sodium 1 % Gel Commonly known  as: VOLTAREN Apply 4 g topically every 6 (six) hours as needed (pain).   docusate 50 MG/5ML liquid Commonly known as: COLACE Place 100 mg into feeding tube every 12 (twelve) hours.   Eliquis 5 MG Tabs tablet Generic drug: apixaban Take 5 mg by mouth 2 (two) times daily.   Ensure Place 30 mLs into feeding tube daily.   feeding supplement (VITAL 1.5 CAL) Liqd Place 1,000 mLs into feeding tube continuous.   escitalopram 10 MG tablet Commonly known as: LEXAPRO Place 10 mg into feeding tube daily.   folic acid 1 MG tablet Commonly known as: FOLVITE Place 1 mg into feeding tube daily.   furosemide 10 MG/ML injection Commonly known as: LASIX Inject 40 mg into the vein every 12 (twelve) hours.   GLUCAGON HCL IJ Inject 1 mg into the skin daily as needed (Hypoglycemia).   guaiFENesin 100 MG/5ML  liquid Commonly known as: ROBITUSSIN Place 10 mLs into feeding tube every 4 (four) hours as needed for cough or to loosen phlegm.   insulin glargine 100 UNIT/ML injection Commonly known as: LANTUS Inject 20 Units into the skin daily.   levothyroxine 150 MCG tablet Commonly known as: SYNTHROID Place 1 tablet (150 mcg total) into feeding tube daily at 6 (six) AM. Start taking on: April 23, 2022 What changed:  medication strength how much to take when to take this   midodrine 10 MG tablet Commonly known as: PROAMATINE Place 1 tablet (10 mg total) into feeding tube 4 (four) times daily -  before meals and at bedtime. What changed: when to take this   nystatin cream Commonly known as: MYCOSTATIN Apply 1 Application topically 2 (two) times daily.   ondansetron 8 MG disintegrating tablet Commonly known as: ZOFRAN-ODT Place 8 mg into feeding tube every 8 (eight) hours as needed for nausea or vomiting.   pantoprazole 40 MG injection Commonly known as: PROTONIX Inject 40 mg into the vein every 12 (twelve) hours.   Potassium Bicarb-Citric Acid 10 MEQ Tbef 10 mEq by Feeding Tube route daily.   vitamin B-12 1000 MCG tablet Commonly known as: CYANOCOBALAMIN Place 1,000 mcg into feeding tube daily.          Discharged Condition: stable  Time spent on discharge greater than 40 minutes.  Vital signs at Discharge. Temp:  [97.9 F (36.6 C)-98.6 F (37 C)] 98.3 F (36.8 C) (07/23 1203) Pulse Rate:  [62-87] 84 (07/23 1200) Resp:  [14-28] 19 (07/23 1145) BP: (85-118)/(52-93) 109/67 (07/23 1200) SpO2:  [87 %-100 %] 94 % (07/23 1200) FiO2 (%):  [40 %] 40 % (07/23 1125) Weight:  [171.9 kg] 171.9 kg (07/23 0500)   Eliseo Gum MSN, AGACNP-BC [place] for pager  04/22/2022, 12:32 PM

## 2022-04-22 NOTE — TOC Transition Note (Signed)
Transition of Care [facility] - CM/SW Discharge Note   Patient Details  Name: Chelsea [name] MRN: [id number] Date of Birth: Feb 19, 1966  [facility] of [facility]) CM/SW Contact:  Ina Homes, [place] Phone Number: 04/22/2022, 1:21 PM   Clinical Narrative:     PTAR called. SW made [facility] aware d/c summary available.   Call Report: [phone] or [phone] Room: 38 Admitting MD:  Dr. Katherine Roan  Final next level of care: [place] (LTAC) Barriers to Discharge: Barriers Resolved   Patient Goals and CMS Choice Patient states their goals for this hospitalization and ongoing recovery are:: to return to [facility].      Discharge Placement              Patient chooses bed at:  ([facility]) Patient to be transferred to facility by: [place] Name of family member notified: husband notified by RN Patient and family notified of of transfer: 04/22/22  Discharge Plan and Services   Discharge Planning Services: CM Consult Post Acute Care Choice: Long Term Acute Care (LTAC)                               Social Determinants of Health (SDOH) Interventions     Readmission Risk Interventions    04/09/2022    4:17 PM  Readmission Risk Prevention Plan  Transportation Screening Complete  PCP or Specialist Appt within 3-5 Days Complete  HRI or [place] Complete  Social Work Consult for [place] Planning/Counseling Complete  Palliative Care Screening Not Applicable  Medication Review Press photographer) Referral to Pharmacy

## 2022-04-22 NOTE — Progress Notes (Signed)
Called and gave report to Sonia Side, Therapist, sports at [facility].

## 2022-04-22 NOTE — Progress Notes (Addendum)
PTAR at bedside to transport pt to [facility]. All hospital equipment removed. Pt belongings placed in bag and put on stretcher. Sonia Side, RN at [facility] notified at this time that pt is en route to their facility.

## 2022-04-22 NOTE — Progress Notes (Signed)
Pt was placed on 10L/40% trach collar for the day. Cuff was deflated. Pt is tolerating well at this time and has PMV in place as well.

## 2022-04-22 NOTE — Progress Notes (Signed)
RT placed patient on VENT HS tonight. Patient tolerating well at this time.

## 2022-04-22 NOTE — TOC Progression Note (Signed)
Transition of Care [facility]) - Progression Note    Patient Details  Name: Chelsea [name] MRN: [id number] Date of Birth: [birth year]  Transition of Care [facility]) CM/SW [place], [place] Phone Number: 04/22/2022, 11:48 AM  Clinical Narrative:     SW informed pt insurance denial overturned. [facility] able to accept today.   SW spoke with Raquel Sarna ([facility] [phone]) Call Report: [phone] or [phone] Room: 67 Admitting MD:  Dr. Katherine Roan   Expected Discharge Plan: Long Term Acute Care (LTAC) Barriers to Discharge: Continued Medical Work up  Expected Discharge Plan and Services Expected Discharge Plan: Portal (LTAC)   Discharge Planning Services: CM Consult Post Acute Care Choice: Long Term Acute Care (LTAC) Living arrangements for the past 2 months: Post-Acute Facility ([facility])                                       Social Determinants of Health (SDOH) Interventions    Readmission Risk Interventions    04/09/2022    4:17 PM  Readmission Risk Prevention Plan  Transportation Screening Complete  PCP or Specialist Appt within 3-5 Days Complete  HRI or Home Care Consult Complete  Social Work Consult for [place] Planning/Counseling Complete  Palliative Care Screening Not Applicable  Medication Review Press photographer) Referral to Pharmacy

## 2022-04-26 DIAGNOSIS — G4733 Obstructive sleep apnea (adult) (pediatric): Secondary | ICD-10-CM

## 2022-04-26 DIAGNOSIS — J9621 Acute and chronic respiratory failure with hypoxia: Secondary | ICD-10-CM

## 2022-04-26 DIAGNOSIS — J449 Chronic obstructive pulmonary disease, unspecified: Secondary | ICD-10-CM

## 2022-04-27 DIAGNOSIS — G4733 Obstructive sleep apnea (adult) (pediatric): Secondary | ICD-10-CM

## 2022-04-27 DIAGNOSIS — J449 Chronic obstructive pulmonary disease, unspecified: Secondary | ICD-10-CM

## 2022-04-27 DIAGNOSIS — J9621 Acute and chronic respiratory failure with hypoxia: Secondary | ICD-10-CM

## 2022-04-28 DIAGNOSIS — J9621 Acute and chronic respiratory failure with hypoxia: Secondary | ICD-10-CM

## 2022-04-28 DIAGNOSIS — G4733 Obstructive sleep apnea (adult) (pediatric): Secondary | ICD-10-CM

## 2022-04-28 DIAGNOSIS — J449 Chronic obstructive pulmonary disease, unspecified: Secondary | ICD-10-CM

## 2022-04-29 DIAGNOSIS — J9621 Acute and chronic respiratory failure with hypoxia: Secondary | ICD-10-CM

## 2022-04-29 DIAGNOSIS — G4733 Obstructive sleep apnea (adult) (pediatric): Secondary | ICD-10-CM

## 2022-04-29 DIAGNOSIS — J449 Chronic obstructive pulmonary disease, unspecified: Secondary | ICD-10-CM

## 2022-04-30 DIAGNOSIS — J449 Chronic obstructive pulmonary disease, unspecified: Secondary | ICD-10-CM

## 2022-04-30 DIAGNOSIS — J9621 Acute and chronic respiratory failure with hypoxia: Secondary | ICD-10-CM

## 2022-04-30 DIAGNOSIS — G4733 Obstructive sleep apnea (adult) (pediatric): Secondary | ICD-10-CM

## 2022-05-01 DIAGNOSIS — J449 Chronic obstructive pulmonary disease, unspecified: Secondary | ICD-10-CM

## 2022-05-01 DIAGNOSIS — J9621 Acute and chronic respiratory failure with hypoxia: Secondary | ICD-10-CM

## 2022-05-01 DIAGNOSIS — G4733 Obstructive sleep apnea (adult) (pediatric): Secondary | ICD-10-CM

## 2022-05-02 DIAGNOSIS — J449 Chronic obstructive pulmonary disease, unspecified: Secondary | ICD-10-CM

## 2022-05-02 DIAGNOSIS — J9621 Acute and chronic respiratory failure with hypoxia: Secondary | ICD-10-CM

## 2022-05-02 DIAGNOSIS — G4733 Obstructive sleep apnea (adult) (pediatric): Secondary | ICD-10-CM

## 2022-05-03 DIAGNOSIS — J9621 Acute and chronic respiratory failure with hypoxia: Secondary | ICD-10-CM

## 2022-05-03 DIAGNOSIS — J449 Chronic obstructive pulmonary disease, unspecified: Secondary | ICD-10-CM

## 2022-05-03 DIAGNOSIS — G4733 Obstructive sleep apnea (adult) (pediatric): Secondary | ICD-10-CM

## 2022-05-04 DIAGNOSIS — J9621 Acute and chronic respiratory failure with hypoxia: Secondary | ICD-10-CM

## 2022-05-04 DIAGNOSIS — J449 Chronic obstructive pulmonary disease, unspecified: Secondary | ICD-10-CM

## 2022-05-04 DIAGNOSIS — G4733 Obstructive sleep apnea (adult) (pediatric): Secondary | ICD-10-CM

## 2022-05-05 DIAGNOSIS — J9621 Acute and chronic respiratory failure with hypoxia: Secondary | ICD-10-CM

## 2022-05-05 DIAGNOSIS — G4733 Obstructive sleep apnea (adult) (pediatric): Secondary | ICD-10-CM

## 2022-05-05 DIAGNOSIS — J189 Pneumonia, unspecified organism: Secondary | ICD-10-CM

## 2022-05-06 DIAGNOSIS — J9621 Acute and chronic respiratory failure with hypoxia: Secondary | ICD-10-CM

## 2022-05-06 DIAGNOSIS — J449 Chronic obstructive pulmonary disease, unspecified: Secondary | ICD-10-CM

## 2022-05-06 DIAGNOSIS — G4733 Obstructive sleep apnea (adult) (pediatric): Secondary | ICD-10-CM

## 2022-05-14 DIAGNOSIS — J449 Chronic obstructive pulmonary disease, unspecified: Secondary | ICD-10-CM

## 2022-05-14 DIAGNOSIS — G4733 Obstructive sleep apnea (adult) (pediatric): Secondary | ICD-10-CM

## 2022-05-14 DIAGNOSIS — J9621 Acute and chronic respiratory failure with hypoxia: Secondary | ICD-10-CM

## 2022-05-15 DIAGNOSIS — J9621 Acute and chronic respiratory failure with hypoxia: Secondary | ICD-10-CM

## 2022-05-15 DIAGNOSIS — J449 Chronic obstructive pulmonary disease, unspecified: Secondary | ICD-10-CM

## 2022-05-15 DIAGNOSIS — G4733 Obstructive sleep apnea (adult) (pediatric): Secondary | ICD-10-CM

## 2022-05-16 DIAGNOSIS — J449 Chronic obstructive pulmonary disease, unspecified: Secondary | ICD-10-CM

## 2022-05-16 DIAGNOSIS — J9621 Acute and chronic respiratory failure with hypoxia: Secondary | ICD-10-CM

## 2022-05-16 DIAGNOSIS — G4733 Obstructive sleep apnea (adult) (pediatric): Secondary | ICD-10-CM

## 2022-05-24 DIAGNOSIS — J449 Chronic obstructive pulmonary disease, unspecified: Secondary | ICD-10-CM

## 2022-05-24 DIAGNOSIS — J9621 Acute and chronic respiratory failure with hypoxia: Secondary | ICD-10-CM

## 2022-05-24 DIAGNOSIS — G4733 Obstructive sleep apnea (adult) (pediatric): Secondary | ICD-10-CM

## 2022-05-25 DIAGNOSIS — G4733 Obstructive sleep apnea (adult) (pediatric): Secondary | ICD-10-CM

## 2022-05-25 DIAGNOSIS — J449 Chronic obstructive pulmonary disease, unspecified: Secondary | ICD-10-CM

## 2022-05-25 DIAGNOSIS — J9621 Acute and chronic respiratory failure with hypoxia: Secondary | ICD-10-CM

## 2022-05-26 DIAGNOSIS — J449 Chronic obstructive pulmonary disease, unspecified: Secondary | ICD-10-CM

## 2022-05-26 DIAGNOSIS — J9621 Acute and chronic respiratory failure with hypoxia: Secondary | ICD-10-CM

## 2022-05-26 DIAGNOSIS — G4733 Obstructive sleep apnea (adult) (pediatric): Secondary | ICD-10-CM

## 2022-05-27 DIAGNOSIS — J449 Chronic obstructive pulmonary disease, unspecified: Secondary | ICD-10-CM

## 2022-05-27 DIAGNOSIS — G4733 Obstructive sleep apnea (adult) (pediatric): Secondary | ICD-10-CM

## 2022-05-27 DIAGNOSIS — J9621 Acute and chronic respiratory failure with hypoxia: Secondary | ICD-10-CM

## 2022-05-28 DIAGNOSIS — J9621 Acute and chronic respiratory failure with hypoxia: Secondary | ICD-10-CM

## 2022-05-28 DIAGNOSIS — J449 Chronic obstructive pulmonary disease, unspecified: Secondary | ICD-10-CM

## 2022-05-28 DIAGNOSIS — G4733 Obstructive sleep apnea (adult) (pediatric): Secondary | ICD-10-CM

## 2022-05-29 DIAGNOSIS — J449 Chronic obstructive pulmonary disease, unspecified: Secondary | ICD-10-CM

## 2022-05-29 DIAGNOSIS — J9621 Acute and chronic respiratory failure with hypoxia: Secondary | ICD-10-CM

## 2022-05-29 DIAGNOSIS — G4733 Obstructive sleep apnea (adult) (pediatric): Secondary | ICD-10-CM

## 2022-05-30 ENCOUNTER — Other Ambulatory Visit (HOSPITAL_COMMUNITY): Payer: Self-pay | Admitting: Pulmonary Disease

## 2022-05-30 DIAGNOSIS — J449 Chronic obstructive pulmonary disease, unspecified: Secondary | ICD-10-CM

## 2022-05-30 DIAGNOSIS — J9621 Acute and chronic respiratory failure with hypoxia: Secondary | ICD-10-CM

## 2022-05-30 DIAGNOSIS — I509 Heart failure, unspecified: Secondary | ICD-10-CM

## 2022-05-30 DIAGNOSIS — G4733 Obstructive sleep apnea (adult) (pediatric): Secondary | ICD-10-CM

## 2022-06-07 ENCOUNTER — Ambulatory Visit (HOSPITAL_COMMUNITY): Payer: Medicare Other

## 2022-06-07 DIAGNOSIS — J449 Chronic obstructive pulmonary disease, unspecified: Secondary | ICD-10-CM

## 2022-06-07 DIAGNOSIS — J9621 Acute and chronic respiratory failure with hypoxia: Secondary | ICD-10-CM

## 2022-06-07 DIAGNOSIS — G4733 Obstructive sleep apnea (adult) (pediatric): Secondary | ICD-10-CM

## 2022-06-12 ENCOUNTER — Ambulatory Visit (HOSPITAL_COMMUNITY)
Admission: RE | Admit: 2022-06-12 | Discharge: 2022-06-12 | Disposition: A | Discharge status: Home / Self Care | Payer: No Typology Code available for payment source | Source: Ambulatory Visit | Attending: Pulmonary Disease | Admitting: Pulmonary Disease

## 2022-06-12 DIAGNOSIS — E785 Hyperlipidemia, unspecified: Secondary | ICD-10-CM | POA: Diagnosis not present

## 2022-06-12 DIAGNOSIS — J449 Chronic obstructive pulmonary disease, unspecified: Secondary | ICD-10-CM | POA: Insufficient documentation

## 2022-06-12 DIAGNOSIS — I4891 Unspecified atrial fibrillation: Secondary | ICD-10-CM | POA: Diagnosis not present

## 2022-06-12 DIAGNOSIS — I358 Other nonrheumatic aortic valve disorders: Secondary | ICD-10-CM | POA: Insufficient documentation

## 2022-06-12 DIAGNOSIS — I509 Heart failure, unspecified: Secondary | ICD-10-CM | POA: Diagnosis present

## 2022-06-12 DIAGNOSIS — I110 Hypertensive heart disease with heart failure: Secondary | ICD-10-CM | POA: Diagnosis not present

## 2022-06-12 LAB — ECHOCARDIOGRAM COMPLETE
AR max vel: 1.22 cm2
AV Area VTI: 1.14 cm2
AV Area mean vel: 1.14 cm2
AV Mean grad: 29 mmHg
AV Peak grad: 51 mmHg
Ao pk vel: 3.57 m/s
Area-P 1/2: 3.14 cm2
P 1/2 time: 795 msec
S' Lateral: 3.1 cm

## 2022-06-15 ENCOUNTER — Other Ambulatory Visit: Payer: Self-pay | Admitting: Physical Medicine and Rehabilitation

## 2022-06-15 DIAGNOSIS — G8929 Other chronic pain: Secondary | ICD-10-CM

## 2022-06-15 DIAGNOSIS — R609 Edema, unspecified: Secondary | ICD-10-CM

## 2022-06-17 NOTE — Progress Notes (Unsigned)
Office Visit    Patient Name: Chelsea [name] Date of Encounter: 06/19/2022  Primary Care Provider:  Rudene Anda, MD Primary Cardiologist:  None Primary Electrophysiologist: None  Chief Complaint    Chelsea [name] is a 56 y.o. female with PMH of HLD, HTN, chronic atrial fibrillation (on Eliquis), DM, COPD, CAD, CHF, OSA on CPAP, moderate aortic stenosis who presents today for hospital follow-up.  Past Medical History    Past Medical History:  Diagnosis Date   Aortic stenosis    Echocardiogram 02/21/11:  Mean gradient 23 mm of mercury; peak gradient 36;; Turbulence noted in the area of the aortic isthmus with peak gradient 23 mmHg-consider MRI to assess for coarctation;    TEE: 04/02/11:  EF 55-60%, mod BAE, trileaflet AV with mild AS (pk and mean 25 and 16), mild AI, mild MR, mild TR, atrial septal aneurysm, no evidence of corarctation   Arthritis    "qwhere" (12/05/2017)   Asthma    Cervical cancer ([place]) 2006   CHF (congestive heart failure) (HCC)    Chronic lower back pain    Complication of anesthesia    "I have a hard time waking up from under it" (12/05/2017)   COPD (chronic obstructive pulmonary disease) ([place])    "lung dr said I don't have this" (12/05/2017)   Coronary artery disease    Diastolic congestive heart failure ([place])    Echo 02/21/11: EF 55-60%, moderate LVH, grade 2 diastolic dysfunction, mild to moderate aortic stenosis with a mean gradient 23 mm of mercury   GERD (gastroesophageal reflux disease)    Heart murmur    Hepatitis C    History of gout X 1   Hyperlipidemia    Hypertension    Hypotension arterial 04/07/2022   Hypothyroid    IBS (irritable bowel syndrome)    Migraine    "monthly" (12/05/2017)   Obesity hypoventilation syndrome ([place])    On home oxygen therapy    "2L all the time" (12/05/2017)   OSA treated with BiPAP    "have CPAP at home too; wearing BiPAP right now" (12/05/2017)   Pneumonia    "several times" (12/05/2017)   Type II diabetes  mellitus ([place])    Past Surgical History:  Procedure Laterality Date   ABDOMINAL SURGERY  2006 X 2   "TAH incision was infected"   CHOLECYSTECTOMY OPEN     TONSILLECTOMY     TOTAL ABDOMINAL HYSTERECTOMY  2006   TRACHEOSTOMY REVISION N/A 04/09/2022   Procedure: CONTROL OF BLEEDING,TRACHEOSTOMY SITE;  Surgeon: Melida Quitter, MD;  Location: [facility] OR;  Service: ENT;  Laterality: N/A;    Allergies  Allergies  Allergen Reactions   Penicillins Anaphylaxis    Patient tolerates cefepime (08/2017)   Iodinated Contrast Media    Minoxidil     Other reaction(s): hives    History of Present Illness    Chelsea [name]  is a 56 year old female with the above mention past medical history who presents today for posthospital follow-up.  Chelsea [name] was initially seen by Dr. [name] in 2012 for complaint of dizziness and CHF.  2D echo in 2012 showed normal LV function, mild left atrial enlargement and mild aortic insufficiency.  Patient was admitted to [facility] with tachycardia and found to have atrial flutter with RVR.  She was treated with TEE/cardioversion and was discharged.  She was lost to follow-up until 03/2021 when she was seen by Dr. Geraldo Pitter for evaluation of atrial fibrillation and  aortic stenosis.  During visit patient was noted to be on significant dose of amiodarone and was seeking second opinion for ablation for further management.  Dr. Geraldo Pitter was in agreement with patient seeking ablation for management of atrial flutter due patient's young age and long-term ramifications of amiodarone.  She was seen by Dr. Lonni Fix at [facility] for consultation of atrial flutters.  She was placed on ZIO monitor for arrhythmia burden.  Patient has had multiple hospitalizations for respiratory failure in the setting of CHF.   She was admitted to the ED on 03/2022 in the ED for management of bleeding from trach stoma.  Patient had tracheostomy placed at [facility] and developed forceful coughing that led to bleeding.  She was transported to [facility], ED management.  Patient underwent ligation of vessels on 7/10 in the OR.  Eliquis was held and was transitioned to heparin for anticoagulation.  Patient required multiple blood transfusions and was placed on trach collar he developed bilateral pleural effusions.  7/21 patient was tolerating trach collar and was transported and discharged to [facility] on 7/23.  Most recent 2D echo was completed 2023 showing EF 60-65%, moderate to severe aortic stenosis, normal LV function, mild LVH, grade 1 DD, severely dilated LA and moderate to severe AV stenosis with mean gradient of 38 mmHg.  Since last being seen in the office patient reports***.  Patient denies chest pain, palpitations, dyspnea, PND, orthopnea, nausea, vomiting, dizziness, syncope, edema, weight gain, or early satiety.     ***Notes:  Home Medications    Current Outpatient Medications  Medication Sig Dispense Refill   Acetaminophen 650 MG/20.3ML SOLN Place 650 mg into feeding tube every 6 (six) hours as needed for mild pain.     albuterol (PROVENTIL) (2.5 MG/3ML) 0.083% nebulizer solution Take 3 mLs (2.5 mg total) by nebulization every 6 (six) hours as needed for wheezing or shortness of breath (I50.32). 75 mL 12   aspirin 81 MG tablet Take 81 mg by mouth daily.     atorvastatin (LIPITOR) 20 MG tablet Place 20 mg into feeding tube daily.     budesonide (PULMICORT) 0.5 MG/2ML nebulizer solution Inhale 0.5 mg into the lungs every 12 (twelve) hours.     Cholecalciferol 125 MCG (5000 UT) TABS Place 5,000 Units into feeding tube daily.     dextrose (GLUTOSE) 40 % GEL Take 15 g by mouth See admin instructions. 15g by mouth every 1/2 hour as needed.     diclofenac Sodium (VOLTAREN) 1 % GEL Apply 4 g topically every 6 (six) hours as needed (pain).     docusate (COLACE) 50 MG/5ML liquid Place 100 mg into feeding tube every 12 (twelve) hours.      ELIQUIS 5 MG TABS tablet Take 5 mg by mouth 2 (two) times daily. (Patient not taking: Reported on 04/07/2022)     Ensure (ENSURE) Place 30 mLs into feeding tube daily.     escitalopram (LEXAPRO) 10 MG tablet Place 10 mg into feeding tube daily.     folic acid (FOLVITE) 1 MG tablet Place 1 mg into feeding tube daily.     furosemide (LASIX) 10 MG/ML injection Inject 40 mg into the vein every 12 (twelve) hours.     GLUCAGON HCL IJ Inject 1 mg into the skin daily as needed (Hypoglycemia).     guaiFENesin (ROBITUSSIN) 100 MG/5ML liquid Place 10 mLs into feeding tube every 4 (four) hours as needed for cough or to loosen phlegm.  insulin glargine (LANTUS) 100 UNIT/ML injection Inject 20 Units into the skin daily.     levothyroxine (SYNTHROID) 150 MCG tablet Place 1 tablet (150 mcg total) into feeding tube daily at 6 (six) AM.     midodrine (PROAMATINE) 10 MG tablet Place 1 tablet (10 mg total) into feeding tube 4 (four) times daily -  before meals and at bedtime. 3 tablet 0   Nutritional Supplements (FEEDING SUPPLEMENT, VITAL 1.5 CAL,) LIQD Place 1,000 mLs into feeding tube continuous.     nystatin cream (MYCOSTATIN) Apply 1 Application topically 2 (two) times daily.     ondansetron (ZOFRAN-ODT) 8 MG disintegrating tablet Place 8 mg into feeding tube every 8 (eight) hours as needed for nausea or vomiting.     pantoprazole (PROTONIX) 40 MG injection Inject 40 mg into the vein every 12 (twelve) hours.     Potassium Bicarb-Citric Acid 10 MEQ TBEF 10 mEq by Feeding Tube route daily.     vitamin B-12 (CYANOCOBALAMIN) 1000 MCG tablet Place 1,000 mcg into feeding tube daily.     No current facility-administered medications for this visit.     Review of Systems  Please see the history of present illness.    (+)*** (+)***  All other systems reviewed and are otherwise negative except as noted above.  Physical Exam    Wt Readings from Last 3 Encounters:  04/22/22 (!) 379 lb (171.9 kg)  04/20/21 (!)  361 lb 12.8 oz (164.1 kg)  03/02/21 (!) 339 lb 12.8 oz (154.1 kg)   TK:PTWSF were no vitals filed for this visit.,There is no height or weight on file to calculate BMI.  Constitutional:      Appearance: Healthy appearance. Not in distress.  Neck:     Vascular: JVD normal.  Pulmonary:     Effort: Pulmonary effort is normal.     Breath sounds: No wheezing. No rales. Diminished in the bases Cardiovascular:     Normal rate. Regular rhythm. Normal S1. Normal S2.      Murmurs: There is no murmur.  Edema:    Peripheral edema absent.  Abdominal:     Palpations: Abdomen is soft non tender. There is no hepatomegaly.  Skin:    General: Skin is warm and dry.  Neurological:     General: No focal deficit present.     Mental Status: Alert and oriented to person, place and time.     Cranial Nerves: Cranial nerves are intact.  EKG/LABS/Other Studies Reviewed    ECG personally reviewed by me today - ***  Risk Assessment/Calculations:    [id number] Score = 5  {Click here to calculate score.  REFRESH note before signing. :1} This indicates a 7.2% annual risk of stroke. The patient's score is based upon: CHF History: 1 HTN History: 1 Diabetes History: 1 Stroke History: 0 Vascular Disease History: 1 Age Score: 0 Gender Score: 1   {This patient has a significant risk of stroke if diagnosed with atrial fibrillation.  Please consider VKA or DOAC agent for anticoagulation if the bleeding risk is acceptable.   You can also use the SmartPhrase .[place] for documentation.   :[id number]}        Lab Results  Component Value Date   WBC 6.5 04/19/2022   HGB 9.1 (L) 04/19/2022   HCT 30.4 (L) 04/19/2022   MCV 90.5 04/19/2022   PLT 234 04/19/2022   Lab Results  Component Value Date   CREATININE 0.58 04/22/2022   BUN 19 04/22/2022   NA 134 (  L) 04/22/2022   K 3.9 04/22/2022   CL 90 (L) 04/22/2022   CO2 35 (H) 04/22/2022   Lab Results  Component Value Date   ALT 24 04/07/2022   AST  10 (L) 04/07/2022   ALKPHOS 62 04/07/2022   BILITOT 0.3 04/07/2022   Lab Results  Component Value Date   TRIG 114 04/10/2022    Lab Results  Component Value Date   HGBA1C 5.9 (H) 04/07/2022    Assessment & Plan    1.  Paroxysmal atrial fibrillation: -Patient currently is*** -Current NOAC dose appropriate based on creatinine, patient has chronic low hemoglobin and hematocrit -Continue Eliquis 5 mg twice daily  2.  Chronic diastolic CHF: -EF 63-84%, moderate to severe aortic stenosis, normal LV function, mild LVH, grade 1 DD, severely dilated LA and moderate to severe AV stenosis with mean gradient of 38 mmHg. -  3. Essential HTN: -Patient's blood pressure today was***  4.  Moderate aortic stenosis: -  5. Essential HTN:      Disposition: Follow-up with None or APP in *** months {Are you ordering a CV Procedure (e.g. stress test, cath, DCCV, TEE, etc)?   Press F2        :[id number]}   Medication Adjustments/Labs and Tests Ordered: Current medicines are reviewed at length with the patient today.  Concerns regarding medicines are outlined above.   Signed, Mable Fill, Marissa Nestle, NP 06/19/2022, 10:30 AM [place]

## 2022-06-19 ENCOUNTER — Ambulatory Visit: Payer: Medicare Other | Attending: Nurse Practitioner | Admitting: Nurse Practitioner

## 2022-06-19 DIAGNOSIS — I10 Essential (primary) hypertension: Secondary | ICD-10-CM | POA: Insufficient documentation

## 2022-07-02 ENCOUNTER — Other Ambulatory Visit (HOSPITAL_COMMUNITY): Payer: Self-pay | Admitting: Family Medicine

## 2022-07-02 ENCOUNTER — Other Ambulatory Visit: Payer: Self-pay | Admitting: Family Medicine

## 2022-07-02 DIAGNOSIS — M25512 Pain in left shoulder: Secondary | ICD-10-CM

## 2022-07-04 ENCOUNTER — Emergency Department (HOSPITAL_COMMUNITY): Payer: Medicare Other

## 2022-07-04 ENCOUNTER — Encounter (HOSPITAL_COMMUNITY): Payer: Self-pay

## 2022-07-04 ENCOUNTER — Inpatient Hospital Stay (HOSPITAL_COMMUNITY)
Admission: EM | Admit: 2022-07-04 | Discharge: 2022-07-10 | DRG: 291 | Disposition: A | Discharge status: Skilled Nursing Facility | Payer: Medicare Other | Attending: Internal Medicine | Admitting: Internal Medicine

## 2022-07-04 DIAGNOSIS — I5033 Acute on chronic diastolic (congestive) heart failure: Secondary | ICD-10-CM | POA: Diagnosis present

## 2022-07-04 DIAGNOSIS — Z7982 Long term (current) use of aspirin: Secondary | ICD-10-CM

## 2022-07-04 DIAGNOSIS — E873 Alkalosis: Secondary | ICD-10-CM | POA: Diagnosis present

## 2022-07-04 DIAGNOSIS — Z8249 Family history of ischemic heart disease and other diseases of the circulatory system: Secondary | ICD-10-CM

## 2022-07-04 DIAGNOSIS — J9622 Acute and chronic respiratory failure with hypercapnia: Secondary | ICD-10-CM | POA: Diagnosis present

## 2022-07-04 DIAGNOSIS — E1169 Type 2 diabetes mellitus with other specified complication: Secondary | ICD-10-CM

## 2022-07-04 DIAGNOSIS — R0902 Hypoxemia: Secondary | ICD-10-CM

## 2022-07-04 DIAGNOSIS — K219 Gastro-esophageal reflux disease without esophagitis: Secondary | ICD-10-CM | POA: Diagnosis present

## 2022-07-04 DIAGNOSIS — I5031 Acute diastolic (congestive) heart failure: Secondary | ICD-10-CM | POA: Diagnosis not present

## 2022-07-04 DIAGNOSIS — I509 Heart failure, unspecified: Secondary | ICD-10-CM | POA: Diagnosis not present

## 2022-07-04 DIAGNOSIS — I350 Nonrheumatic aortic (valve) stenosis: Secondary | ICD-10-CM | POA: Diagnosis present

## 2022-07-04 DIAGNOSIS — J810 Acute pulmonary edema: Principal | ICD-10-CM

## 2022-07-04 DIAGNOSIS — G4733 Obstructive sleep apnea (adult) (pediatric): Secondary | ICD-10-CM | POA: Diagnosis present

## 2022-07-04 DIAGNOSIS — J9621 Acute and chronic respiratory failure with hypoxia: Secondary | ICD-10-CM | POA: Diagnosis present

## 2022-07-04 DIAGNOSIS — I110 Hypertensive heart disease with heart failure: Principal | ICD-10-CM | POA: Diagnosis present

## 2022-07-04 DIAGNOSIS — I482 Chronic atrial fibrillation, unspecified: Secondary | ICD-10-CM | POA: Diagnosis present

## 2022-07-04 DIAGNOSIS — E662 Morbid (severe) obesity with alveolar hypoventilation: Secondary | ICD-10-CM | POA: Diagnosis present

## 2022-07-04 DIAGNOSIS — Z90710 Acquired absence of both cervix and uterus: Secondary | ICD-10-CM | POA: Diagnosis not present

## 2022-07-04 DIAGNOSIS — Z20822 Contact with and (suspected) exposure to covid-19: Secondary | ICD-10-CM | POA: Diagnosis present

## 2022-07-04 DIAGNOSIS — Z79899 Other long term (current) drug therapy: Secondary | ICD-10-CM

## 2022-07-04 DIAGNOSIS — R5381 Other malaise: Secondary | ICD-10-CM | POA: Diagnosis present

## 2022-07-04 DIAGNOSIS — I5032 Chronic diastolic (congestive) heart failure: Secondary | ICD-10-CM | POA: Diagnosis not present

## 2022-07-04 DIAGNOSIS — E039 Hypothyroidism, unspecified: Secondary | ICD-10-CM | POA: Diagnosis present

## 2022-07-04 DIAGNOSIS — I2510 Atherosclerotic heart disease of native coronary artery without angina pectoris: Secondary | ICD-10-CM | POA: Diagnosis present

## 2022-07-04 DIAGNOSIS — K581 Irritable bowel syndrome with constipation: Secondary | ICD-10-CM | POA: Diagnosis present

## 2022-07-04 DIAGNOSIS — Z8541 Personal history of malignant neoplasm of cervix uteri: Secondary | ICD-10-CM | POA: Diagnosis not present

## 2022-07-04 DIAGNOSIS — Z794 Long term (current) use of insulin: Secondary | ICD-10-CM | POA: Diagnosis not present

## 2022-07-04 DIAGNOSIS — I10 Essential (primary) hypertension: Secondary | ICD-10-CM | POA: Diagnosis present

## 2022-07-04 DIAGNOSIS — Z79890 Hormone replacement therapy (postmenopausal): Secondary | ICD-10-CM

## 2022-07-04 DIAGNOSIS — Z6841 Body Mass Index (BMI) 40.0 and over, adult: Secondary | ICD-10-CM

## 2022-07-04 DIAGNOSIS — I480 Paroxysmal atrial fibrillation: Secondary | ICD-10-CM | POA: Diagnosis present

## 2022-07-04 DIAGNOSIS — D649 Anemia, unspecified: Secondary | ICD-10-CM | POA: Diagnosis present

## 2022-07-04 DIAGNOSIS — Z8679 Personal history of other diseases of the circulatory system: Secondary | ICD-10-CM | POA: Diagnosis not present

## 2022-07-04 DIAGNOSIS — E1142 Type 2 diabetes mellitus with diabetic polyneuropathy: Secondary | ICD-10-CM | POA: Diagnosis present

## 2022-07-04 DIAGNOSIS — E785 Hyperlipidemia, unspecified: Secondary | ICD-10-CM | POA: Diagnosis present

## 2022-07-04 HISTORY — DX: Acute and chronic respiratory failure with hypoxia: J96.21

## 2022-07-04 LAB — I-STAT VENOUS BLOOD GAS, ED
Acid-Base Excess: 16 mmol/L — ABNORMAL HIGH (ref 0.0–2.0)
Bicarbonate: 42.6 mmol/L — ABNORMAL HIGH (ref 20.0–28.0)
Calcium, Ion: 1.08 mmol/L — ABNORMAL LOW (ref 1.15–1.40)
HCT: 33 % — ABNORMAL LOW (ref 36.0–46.0)
Hemoglobin: 11.2 g/dL — ABNORMAL LOW (ref 12.0–15.0)
O2 Saturation: 89 %
Potassium: 4.7 mmol/L (ref 3.5–5.1)
Sodium: 137 mmol/L (ref 135–145)
TCO2: 44 mmol/L — ABNORMAL HIGH (ref 22–32)
pCO2, Ven: 63.8 mmHg — ABNORMAL HIGH (ref 44–60)
pH, Ven: 7.432 — ABNORMAL HIGH (ref 7.25–7.43)
pO2, Ven: 57 mmHg — ABNORMAL HIGH (ref 32–45)

## 2022-07-04 LAB — CBC
HCT: 33.7 % — ABNORMAL LOW (ref 36.0–46.0)
HCT: 32.8 % — ABNORMAL LOW (ref 36.0–46.0)
Hemoglobin: 9.6 g/dL — ABNORMAL LOW (ref 12.0–15.0)
Hemoglobin: 9.4 g/dL — ABNORMAL LOW (ref 12.0–15.0)
MCH: 25.3 pg — ABNORMAL LOW (ref 26.0–34.0)
MCH: 25.4 pg — ABNORMAL LOW (ref 26.0–34.0)
MCHC: 28.5 g/dL — ABNORMAL LOW (ref 30.0–36.0)
MCHC: 28.7 g/dL — ABNORMAL LOW (ref 30.0–36.0)
MCV: 88.7 fL (ref 80.0–100.0)
MCV: 88.6 fL (ref 80.0–100.0)
Platelets: 272 10*3/uL (ref 150–400)
Platelets: 268 10*3/uL (ref 150–400)
RBC: 3.8 MIL/uL — ABNORMAL LOW (ref 3.87–5.11)
RBC: 3.7 MIL/uL — ABNORMAL LOW (ref 3.87–5.11)
RDW: 17.8 % — ABNORMAL HIGH (ref 11.5–15.5)
RDW: 17.8 % — ABNORMAL HIGH (ref 11.5–15.5)
WBC: 6.9 10*3/uL (ref 4.0–10.5)
WBC: 6.1 10*3/uL (ref 4.0–10.5)
nRBC: 0 % (ref 0.0–0.2)
nRBC: 0 % (ref 0.0–0.2)

## 2022-07-04 LAB — COMPREHENSIVE METABOLIC PANEL
ALT: 9 U/L (ref 0–44)
AST: 19 U/L (ref 15–41)
Albumin: 3.3 g/dL — ABNORMAL LOW (ref 3.5–5.0)
Alkaline Phosphatase: 71 U/L (ref 38–126)
Anion gap: 9 (ref 5–15)
BUN: 31 mg/dL — ABNORMAL HIGH (ref 6–20)
CO2: 38 mmol/L — ABNORMAL HIGH (ref 22–32)
Calcium: 9.2 mg/dL (ref 8.9–10.3)
Chloride: 91 mmol/L — ABNORMAL LOW (ref 98–111)
Creatinine, Ser: 0.89 mg/dL (ref 0.44–1.00)
GFR, Estimated: >60 mL/min (ref >60)
Glucose, Bld: 202 mg/dL — ABNORMAL HIGH (ref 70–99)
Potassium: 4.8 mmol/L (ref 3.5–5.1)
Sodium: 138 mmol/L (ref 135–145)
Total Bilirubin: 0.5 mg/dL (ref 0.3–1.2)
Total Protein: 7.3 g/dL (ref 6.5–8.1)

## 2022-07-04 LAB — URINALYSIS, ROUTINE W REFLEX MICROSCOPIC
Bilirubin Urine: NEGATIVE
Glucose, UA: NEGATIVE mg/dL
Hgb urine dipstick: NEGATIVE
Ketones, ur: NEGATIVE mg/dL
Leukocytes,Ua: NEGATIVE
Nitrite: NEGATIVE
Protein, ur: NEGATIVE mg/dL
Specific Gravity, Urine: 1.008 (ref 1.005–1.030)
pH: 6 (ref 5.0–8.0)

## 2022-07-04 LAB — RESP PANEL BY RT-PCR (FLU A&B, COVID) ARPGX2
Influenza A by PCR: NEGATIVE
Influenza B by PCR: NEGATIVE
SARS Coronavirus 2 by RT PCR: NEGATIVE

## 2022-07-04 LAB — CREATININE, SERUM
Creatinine, Ser: 0.96 mg/dL (ref 0.44–1.00)
GFR, Estimated: >60 mL/min (ref >60)

## 2022-07-04 LAB — LACTIC ACID, PLASMA: Lactic Acid, Venous: 1.5 mmol/L (ref 0.5–1.9)

## 2022-07-04 LAB — BRAIN NATRIURETIC PEPTIDE: B Natriuretic Peptide: 363.9 pg/mL — ABNORMAL HIGH (ref 0.0–100.0)

## 2022-07-04 LAB — CBG MONITORING, ED: Glucose-Capillary: 191 mg/dL — ABNORMAL HIGH (ref 70–99)

## 2022-07-04 MED ORDER — IPRATROPIUM-ALBUTEROL 0.5-2.5 (3) MG/3ML IN SOLN
3.0000 mL | Freq: Once | RESPIRATORY_TRACT | Status: AC
  Administered 2022-07-04: 3 mL via RESPIRATORY_TRACT
  Filled 2022-07-04: qty 3

## 2022-07-04 MED ORDER — DIPHENHYDRAMINE HCL 50 MG/ML IJ SOLN: 50.0000 mg | Freq: Once | INTRAMUSCULAR | Status: DC

## 2022-07-04 MED ORDER — FUROSEMIDE 10 MG/ML IJ SOLN
40.0000 mg | Freq: Once | INTRAMUSCULAR | Status: AC
  Administered 2022-07-04: 40 mg via INTRAVENOUS
  Filled 2022-07-04: qty 4

## 2022-07-04 MED ORDER — DIPHENHYDRAMINE HCL 25 MG PO CAPS: 50.0000 mg | ORAL_CAPSULE | Freq: Once | ORAL | Status: DC

## 2022-07-04 MED ORDER — ALBUTEROL SULFATE (2.5 MG/3ML) 0.083% IN NEBU: 2.5000 mg | INHALATION_SOLUTION | RESPIRATORY_TRACT | Status: DC | PRN

## 2022-07-04 MED ORDER — ALBUTEROL SULFATE (2.5 MG/3ML) 0.083% IN NEBU
10.0000 mg/h | INHALATION_SOLUTION | Freq: Once | RESPIRATORY_TRACT | Status: AC
  Administered 2022-07-04: 10 mg/h via RESPIRATORY_TRACT
  Filled 2022-07-04: qty 12
  Filled 2022-07-04: qty 3

## 2022-07-04 MED ORDER — OXYCODONE HCL 5 MG PO TABS
5.0000 mg | ORAL_TABLET | ORAL | Status: DC | PRN
  Administered 2022-07-05 – 2022-07-06 (×5): 5 mg via ORAL
  Filled 2022-07-04 (×5): qty 1

## 2022-07-04 MED ORDER — ONDANSETRON HCL 4 MG PO TABS: 4.0000 mg | ORAL_TABLET | Freq: Four times a day (QID) | ORAL | Status: DC | PRN

## 2022-07-04 MED ORDER — ACETAMINOPHEN 325 MG PO TABS
650.0000 mg | ORAL_TABLET | Freq: Four times a day (QID) | ORAL | Status: DC | PRN
  Administered 2022-07-07 – 2022-07-10 (×5): 650 mg via ORAL
  Filled 2022-07-04 (×5): qty 2

## 2022-07-04 MED ORDER — SENNOSIDES-DOCUSATE SODIUM 8.6-50 MG PO TABS: 1.0000 | ORAL_TABLET | Freq: Every evening | ORAL | Status: DC | PRN

## 2022-07-04 MED ORDER — HYDROCODONE-ACETAMINOPHEN 5-325 MG PO TABS
2.0000 | ORAL_TABLET | Freq: Once | ORAL | Status: AC
  Administered 2022-07-04: 2 via ORAL
  Filled 2022-07-04: qty 2

## 2022-07-04 MED ORDER — ACETAMINOPHEN 650 MG RE SUPP: 650.0000 mg | Freq: Four times a day (QID) | RECTAL | Status: DC | PRN

## 2022-07-04 MED ORDER — ONDANSETRON HCL 4 MG/2ML IJ SOLN: 4.0000 mg | Freq: Four times a day (QID) | INTRAMUSCULAR | Status: DC | PRN

## 2022-07-04 MED ORDER — GABAPENTIN 300 MG PO CAPS
300.0000 mg | ORAL_CAPSULE | Freq: Three times a day (TID) | ORAL | Status: DC
  Administered 2022-07-04 – 2022-07-10 (×17): 300 mg via ORAL
  Filled 2022-07-04 (×17): qty 1

## 2022-07-04 MED ORDER — ENOXAPARIN SODIUM 80 MG/0.8ML IJ SOSY
70.0000 mg | PREFILLED_SYRINGE | INTRAMUSCULAR | Status: DC
  Administered 2022-07-04: 70 mg via SUBCUTANEOUS
  Filled 2022-07-04: qty 0.7

## 2022-07-04 MED ORDER — INSULIN ASPART 100 UNIT/ML IJ SOLN
0.0000 [IU] | Freq: Three times a day (TID) | INTRAMUSCULAR | Status: DC
  Administered 2022-07-05: 4 [IU] via SUBCUTANEOUS
  Administered 2022-07-05 (×2): 8 [IU] via SUBCUTANEOUS
  Administered 2022-07-06: 2 [IU] via SUBCUTANEOUS
  Administered 2022-07-07: 3 [IU] via SUBCUTANEOUS
  Administered 2022-07-07 (×2): 2 [IU] via SUBCUTANEOUS
  Administered 2022-07-08: 4 [IU] via SUBCUTANEOUS
  Administered 2022-07-08 (×2): 2 [IU] via SUBCUTANEOUS
  Administered 2022-07-09: 4 [IU] via SUBCUTANEOUS
  Administered 2022-07-09 (×2): 2 [IU] via SUBCUTANEOUS
  Administered 2022-07-10: 4 [IU] via SUBCUTANEOUS

## 2022-07-04 MED ORDER — METHYLPREDNISOLONE SODIUM SUCC 40 MG IJ SOLR
40.0000 mg | Freq: Once | INTRAMUSCULAR | Status: AC
  Administered 2022-07-05: 40 mg via INTRAVENOUS
  Filled 2022-07-04: qty 1

## 2022-07-04 NOTE — H&P (Signed)
History and Physical  Patient Name: Chelsea [name]     JEH:[id number]    DOB: [birth year]    DOA: 07/04/2022 PCP: Rudene Anda, MD  Chief Complaint: SOB  HPI: Chelsea [name] is a 56 y.o. with history of heart failure with preserved ejection fraction, CAD, COPD, obstructive sleep apnea, A-fib, hyperlipidemia, hypertension, type 2 diabetes, chronic pain who presented to the emergency department from her rehab facility due to hypoxia and shortness of breath.  Patient states that 5 days ago she was feeling short of breath with a dry cough.  Chest x-rays were obtained at her rehab facility and patient was started on Levaquin.  She completed a 5-day course of Levaquin without improvement in her symptoms.  Her oxygen saturations were noted to be 62% this afternoon so she was transferred to the ER for further evaluation.  She was placed on high flow nasal cannula.  On presentation she was hypoxic satting in the low to mid 80s on 5 L.  Heart rate 63, blood pressure 128/92.  Labs were obtained which were notable for negative respiratory viral panel, CMP significant for bicarb 38, glucose 202, creatinine 0.89.  CBC showed WBC 6.9, hemoglobin 9.6, platelets 272, BNP 363, blood gas showed hypercarbia with PCO2 of 64, pH 7.4.  Lactic acid was pending on admission.  Chest x-ray showed pulmonary vascular congestion with bilateral airspace disease x-ray of the right knee demonstrated no acute abnormality.  Patient states that she has gained 10 pounds over the last several days.  She also feels worsening swelling in her arms and legs as well as her abdomen.  She has been having bowel movements without difficulty.  She usually takes 40 mg of Lasix p.o. twice a day and has been compliant with this regimen.  She states that she has a CPAP/BiPAP machine at her rehab facility however it is dysfunctioning and she has not used it in the last week.  ROS: Negative unless noted   Past Medical History:  Diagnosis Date    Aortic stenosis    Echocardiogram 02/21/11:  Mean gradient 23 mm of mercury; peak gradient 36;; Turbulence noted in the area of the aortic isthmus with peak gradient 23 mmHg-consider MRI to assess for coarctation;    TEE: 04/02/11:  EF 55-60%, mod BAE, trileaflet AV with mild AS (pk and mean 25 and 16), mild AI, mild MR, mild TR, atrial septal aneurysm, no evidence of corarctation   Arthritis    "qwhere" (12/05/2017)   Asthma    Cervical cancer ([place]) 2006   CHF (congestive heart failure) (HCC)    Chronic lower back pain    Complication of anesthesia    "I have a hard time waking up from under it" (12/05/2017)   COPD (chronic obstructive pulmonary disease) ([place])    "lung dr said I don't have this" (12/05/2017)   Coronary artery disease    Diastolic congestive heart failure ([place])    Echo 02/21/11: EF 55-60%, moderate LVH, grade 2 diastolic dysfunction, mild to moderate aortic stenosis with a mean gradient 23 mm of mercury   GERD (gastroesophageal reflux disease)    Heart murmur    Hepatitis C    History of gout X 1   Hyperlipidemia    Hypertension    Hypotension arterial 04/07/2022   Hypothyroid    IBS (irritable bowel syndrome)    Migraine    "monthly" (12/05/2017)   Obesity hypoventilation syndrome ([place])    On home oxygen therapy    "  2L all the time" (12/05/2017)   OSA treated with BiPAP    "have CPAP at home too; wearing BiPAP right now" (12/05/2017)   Pneumonia    "several times" (12/05/2017)   Type II diabetes mellitus ([place])     Past Surgical History:  Procedure Laterality Date   ABDOMINAL SURGERY  2006 X 2   "TAH incision was infected"   CHOLECYSTECTOMY OPEN     TONSILLECTOMY     TOTAL ABDOMINAL HYSTERECTOMY  2006   TRACHEOSTOMY REVISION N/A 04/09/2022   Procedure: CONTROL OF BLEEDING,TRACHEOSTOMY SITE;  Surgeon: Melida Quitter, MD;  Location: [facility];  Service: ENT;  Laterality: N/A;    Social History: Patient lives at a rehab facility  Allergies  Allergen Reactions   Penicillins  Anaphylaxis    Patient tolerates cefepime (08/2017)   Insulin Lispro Other (See Comments)    Adder per MAR from SNF   Iodinated Contrast Media    Minoxidil     Other reaction(s): hives    Family history: family history includes Heart disease in her father.  Prior to Admission medications   Medication Sig Start Date End Date Taking? Authorizing Provider  Acetaminophen 650 MG/20.3ML SOLN Place 650 mg into feeding tube every 6 (six) hours as needed for mild pain.   Yes [provider]  albuterol (PROVENTIL) (2.5 MG/3ML) 0.083% nebulizer solution Take 3 mLs (2.5 mg total) by nebulization every 6 (six) hours as needed for wheezing or shortness of breath (I50.32). 07/04/16  Yes [name], MD  ALPRAZolam Duanne Moron) 0.5 MG tablet Take 0.5 mg by mouth every 8 (eight) hours as needed. 06/27/22  Yes [provider]  aspirin 81 MG tablet Take 81 mg by mouth daily.   Yes [provider]  budesonide (PULMICORT) 0.5 MG/2ML nebulizer solution Inhale 0.5 mg into the lungs every 12 (twelve) hours.   Yes [provider]  Cholecalciferol 125 MCG (5000 UT) TABS Place 5,000 Units into feeding tube daily.   Yes [provider]  diclofenac Sodium (VOLTAREN) 1 % GEL Apply 4 g topically every 6 (six) hours as needed (pain).   Yes [provider]  ELIQUIS 5 MG TABS tablet Take 5 mg by mouth 2 (two) times daily. 04/12/21  Yes [provider]  folic acid (FOLVITE) 1 MG tablet Place 1 mg into feeding tube daily.   Yes [provider]  furosemide (LASIX) 10 MG/ML injection Inject 40 mg into the vein every 12 (twelve) hours.   Yes [provider]  gabapentin (NEURONTIN) 400 MG capsule Take 400 mg by mouth 3 (three) times daily.   Yes [provider]  GLUCAGON HCL IJ Inject 1 mg into the skin daily as needed (Hypoglycemia).   Yes [provider]  guaiFENesin (ROBITUSSIN) 100 MG/5ML liquid Place 10 mLs into feeding tube every 4  (four) hours as needed for cough or to loosen phlegm.   Yes [provider]  insulin glargine (LANTUS) 100 UNIT/ML injection Inject 20 Units into the skin as needed (for insulin levels).   Yes [provider]  Iron-Folic Acid-Vit F35 (IRON FORMULA PO) Take 1 tablet by mouth daily as needed (for iron levels.).   Yes [provider]  levothyroxine (SYNTHROID) 150 MCG tablet Place 1 tablet (150 mcg total) into feeding tube daily at 6 (six) AM. 04/23/22  Yes Bowser, Laurel Dimmer, NP  Magnesium Hydroxide (MILK OF MAGNESIA PO) Take 1 tablet by mouth as needed (for milk of magnesia levels).   Yes  [provider]  Menthol, Topical Analgesic, (BIOFREEZE EX) Apply 1 application  topically as needed (fo pain).   Yes [provider]  Nutritional Supplements (FEEDING SUPPLEMENT, VITAL 1.5 CAL,) LIQD Place 1,000 mLs into feeding tube continuous.   Yes [provider]  pantoprazole (PROTONIX) 40 MG injection Inject 40 mg into the vein every 12 (twelve) hours.   Yes [provider]  Potassium Bicarb-Citric Acid 10 MEQ TBEF 10 mEq by Feeding Tube route daily.   Yes [provider]  Probiotic Product (CULTURELLE PROBIOTICS PO) Take 1 tablet by mouth daily as needed (for probiotic levels).   Yes [provider]  vitamin B-12 (CYANOCOBALAMIN) 1000 MCG tablet Place 1,000 mcg into feeding tube daily.    [provider]       Physical Exam: BP 113/78   Pulse 73   Temp 98.4 F (36.9 C) (Oral)   Resp (!) 23   Ht '5\' 5"'$  (1.651 m)   Wt (!) 147 kg   SpO2 92%   BMI 53.92 kg/m  General appearance: Not in acute distress Eyes: Anicteric, conjunctiva pink, lids and lashes normal. PERRL.    ENT: No nasal deformity, discharge, epistaxis.  Lymph: No cervical or supraclavicular lymphadenopathy. Skin: Warm and dry.  No jaundice.  No suspicious rashes or lesions. Cardiac: RRR, nl S1-S2, no murmurs appreciated.  2+ edema in bilateral lower  extremities Respiratory: Normal respiratory rate and rhythm.  Crackles in the bases with diminished breath sounds Abdomen: Abdomen soft, nontender.  MSK: No deformities or effusions of the large joints of the upper or lower extremities bilaterally.  Neuro: No focal neurologic deficits.  Psych: Normal mood and affect     Labs on Admission:  I have personally reviewed following labs and imaging studies: CBC: Recent Labs  Lab 07/04/22 1848 07/04/22 1858 07/04/22 2240  WBC 6.9  --  6.1  HGB 9.6* 11.2* 9.4*  HCT 33.7* 33.0* 32.8*  MCV 88.7  --  88.6  PLT 272  --  244   Basic Metabolic Panel: Recent Labs  Lab 07/04/22 1848 07/04/22 1858  NA 138 137  K 4.8 4.7  CL 91*  --   CO2 38*  --   GLUCOSE 202*  --   BUN 31*  --   CREATININE 0.89  --   CALCIUM 9.2  --    GFR: Estimated Creatinine Clearance: 101.2 mL/min (by C-G formula based on SCr of 0.89 mg/dL).  Liver Function Tests: Recent Labs  Lab 07/04/22 1848  AST 19  ALT 9  ALKPHOS 71  BILITOT 0.5  PROT 7.3  ALBUMIN 3.3*   No results for input(s): "LIPASE", "AMYLASE" in the last 168 hours. No results for input(s): "AMMONIA" in the last 168 hours. Coagulation Profile: No results for input(s): "INR", "PROTIME" in the last 168 hours. Cardiac Enzymes: No results for input(s): "CKTOTAL", "CKMB", "CKMBINDEX", "TROPONINI" in the last 168 hours. BNP (last 3 results) No results for input(s): "PROBNP" in the last 8760 hours.  Radiological Exams on Admission:  DG Knee Right Port  Result Date: 07/04/2022 CLINICAL DATA:  Right knee injury yesterday, limited range of motion EXAM: PORTABLE RIGHT KNEE - 1-2 VIEW COMPARISON:  07/03/2021 FINDINGS: Frontal, bilateral oblique, and lateral views of the right knee are obtained. No acute fracture, subluxation, or dislocation. 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 No joint effusion. There is diffuse subcutaneous edema. IMPRESSION: 1. Stable severe 3  compartmental osteoarthritis. 2. No acute bony abnormality. Electronically Signed  By: Randa Ngo M.D.   On: 07/04/2022 18:58   DG Chest Port 1 View  Result Date: 07/04/2022 CLINICAL DATA:  Hypoxia, history of pneumonia, short of breath EXAM: PORTABLE CHEST 1 VIEW COMPARISON:  04/18/2022 FINDINGS: Single frontal view of the chest demonstrates an enlarged cardiac silhouette. There is increased central vascular congestion with bilateral perihilar airspace disease. No large effusion or pneumothorax. No acute bony abnormalities. IMPRESSION: 1. Constellation of findings most consistent with congestive heart failure. Superimposed pneumonia would be difficult to exclude. Electronically Signed   By: Randa Ngo M.D.   On: 07/04/2022 18:57     Assessment/Plan Ms. Mak is a 56 year old female with a history of chronic respiratory failure likely due to obesity hypoventilation syndrome who presented to the ER with hypoxia.  She completed a 5-day course for community-acquired pneumonia with Levaquin without improvement in her symptoms.  Patient remains afebrile and has not had any productive sputum.  Differential diagnosis for presentation includes most likely heart failure exacerbation, less likely pneumonia or pulmonary embolism.  We will plan to obtain further advanced imaging with CT pulmonary embolism protocol, continue diuresis and encourage BiPAP usage.  #Acute on chronic hypoxic hypercapnic respiratory failure most likely due to CHF exacerbation, POA, active #Acute on chronic heart failure with preserved ejection fraction exacerbation, POA, active #Obesity hypoventilation syndrome -Patient has interstitial edema on chest x-ray as well as elevated BNP and swelling in her legs with crackles in the lungs all consistent with heart failure exacerbation. -Recent echocardiogram on 9/12 showed preserved EF -Concern for pulmonary hypertension Plan: IV diuresis Daily weights Morning labs  #Type 2  diabetes-corrective insulin Chronic neuropathic pain-oxycodone and gabapentin Chronic anemia-trend CBC Chronic constipation-continue senna docusate  DVT prophylaxis: lovenox  Code Status: FULL  Disposition Plan: Anticipate DC in 3-5d Consults called: none Admission status: inp   At the point of initial evaluation, it is my clinical opinion that admission for INPATIENT is reasonable and necessary because the patient's presenting complaints in the context of their chronic conditions represent sufficient risk of deterioration or significant morbidity to constitute reasonable grounds for close observation in the hospital setting.   Medical decision making: Patient seen at 11:10 PM on 07/04/2022. What exists of the patient's chart was reviewed in depth and summarized above.    Emilee Hero [facility] Please page though [facility] or Epic secure chat:  For password, contact charge nurse

## 2022-07-04 NOTE — ED Provider Notes (Signed)
[facility] EMERGENCY DEPARTMENT Provider Note   CSN: [id number] Arrival date & time: 07/04/22  1726     History No chief complaint on file.   Chelsea [name] is a 56 y.o. female with a past medical history of hypertension, congestive heart failure, morbid obesity and sleep apnea presenting today with shortness of breath.  She reports being diagnosed with pneumonia 5 days ago at an outside clinic.  She was started on Levaquin but says that she is becoming increasingly dyspneic.  No palpitations or chest pain, does report an occasional dry cough.  Denies any tobacco use and reports taking Lasix for fluid retention.  Reports that her temperature over the past couple of days has been as high as 99.  Reports wearing 5 L of oxygen at home with normal saturations  Also reports a right knee injury from her facility when she was being moved with a [employer] lift yesterday.   HPI     Home Medications Prior to Admission medications   Medication Sig Start Date End Date Taking? Authorizing Provider  Acetaminophen 650 MG/20.3ML SOLN Place 650 mg into feeding tube every 6 (six) hours as needed for mild pain.    [provider]  albuterol (PROVENTIL) (2.5 MG/3ML) 0.083% nebulizer solution Take 3 mLs (2.5 mg total) by nebulization every 6 (six) hours as needed for wheezing or shortness of breath (I50.32). 07/04/16   [name], MD  aspirin 81 MG tablet Take 81 mg by mouth daily.    [provider]  atorvastatin (LIPITOR) 20 MG tablet Place 20 mg into feeding tube daily. 06/22/18   [provider]  budesonide (PULMICORT) 0.5 MG/2ML nebulizer solution Inhale 0.5 mg into the lungs every 12 (twelve) hours.    [provider]  Cholecalciferol 125 MCG (5000 UT) TABS Place 5,000 Units into feeding tube daily.    [provider]  dextrose (GLUTOSE) 40 % GEL Take 15 g by mouth See admin instructions. 15g by mouth every 1/2 hour as needed.     [provider]  diclofenac Sodium (VOLTAREN) 1 % GEL Apply 4 g topically every 6 (six) hours as needed (pain).    [provider]  docusate (COLACE) 50 MG/5ML liquid Place 100 mg into feeding tube every 12 (twelve) hours.    [provider]  ELIQUIS 5 MG TABS tablet Take 5 mg by mouth 2 (two) times daily. Patient not taking: Reported on 04/07/2022 04/12/21   [provider]  Ensure (ENSURE) Place 30 mLs into feeding tube daily.    [provider]  escitalopram (LEXAPRO) 10 MG tablet Place 10 mg into feeding tube daily.    [provider]  folic acid (FOLVITE) 1 MG tablet Place 1 mg into feeding tube daily.    [provider]  furosemide (LASIX) 10 MG/ML injection Inject 40 mg into the vein every 12 (twelve) hours.    [provider]  GLUCAGON HCL IJ Inject 1 mg into the skin daily as needed (Hypoglycemia).    [provider]  guaiFENesin (ROBITUSSIN) 100 MG/5ML liquid Place 10 mLs into feeding tube every 4 (four) hours as needed for cough or to loosen phlegm.    [provider]  insulin glargine (LANTUS) 100 UNIT/ML injection Inject 20 Units into the skin daily.    [provider]  levothyroxine (SYNTHROID) 150 MCG tablet Place 1 tablet (150 mcg total) into feeding tube daily at 6 (six) AM. 04/23/22   Bowser, Shirlee Limerick  E, NP  midodrine (PROAMATINE) 10 MG tablet Place 1 tablet (10 mg total) into feeding tube 4 (four) times daily -  before meals and at bedtime. 04/22/22   Bowser, Laurel Dimmer, NP  Nutritional Supplements (FEEDING SUPPLEMENT, VITAL 1.5 CAL,) LIQD Place 1,000 mLs into feeding tube continuous.    [provider]  nystatin cream (MYCOSTATIN) Apply 1 Application topically 2 (two) times daily.    [provider]  ondansetron (ZOFRAN-ODT) 8 MG disintegrating tablet Place 8 mg into feeding tube every 8 (eight) hours as needed for nausea or vomiting.    [provider]  pantoprazole  (PROTONIX) 40 MG injection Inject 40 mg into the vein every 12 (twelve) hours.    [provider]  Potassium Bicarb-Citric Acid 10 MEQ TBEF 10 mEq by Feeding Tube route daily.    [provider]  vitamin B-12 (CYANOCOBALAMIN) 1000 MCG tablet Place 1,000 mcg into feeding tube daily.    [provider]      Allergies    Penicillins, Iodinated contrast media, and Minoxidil    Review of Systems   Review of Systems  Physical Exam Updated Vital Signs Ht '5\' 5"'$  (1.651 m)   Wt (!) 147 kg   SpO2 (!) 86%   BMI 53.92 kg/m  Physical Exam Vitals and nursing note reviewed.  Constitutional:      Appearance: Normal appearance.  HENT:     Head: Normocephalic and atraumatic.     Mouth/Throat:     Mouth: Mucous membranes are moist.     Pharynx: Oropharynx is clear.  Eyes:     General: No scleral icterus.    Conjunctiva/sclera: Conjunctivae normal.  Cardiovascular:     Rate and Rhythm: Normal rate and regular rhythm.     Heart sounds: Murmur (systolic) heard.  Pulmonary:     Effort: Pulmonary effort is normal. No respiratory distress.  Musculoskeletal:     Right lower leg: Edema present.     Left lower leg: Edema present.     Comments: Tenderness to the anterior right knee.  Negative ligamental testing  Skin:    Findings: No rash.  Neurological:     Mental Status: She is alert and oriented to person, place, and time.  Psychiatric:        Mood and Affect: Mood normal.        Behavior: Behavior normal.     ED Results / Procedures / Treatments   Labs (all labs ordered are listed, but only abnormal results are displayed) Labs Reviewed - No data to display  EKG EKG Interpretation  Date/Time:  Wednesday July 04 2022 17:59:50 EDT Ventricular Rate:  67 PR Interval:  145 QRS Duration: 97 QT Interval:  442 QTC Calculation: 467 R Axis:   88 Text Interpretation: Sinus rhythm Borderline repolarization abnormality Confirmed by Garnette Gunner [phone]) on  07/04/2022 6:47:03 PM  Radiology DG Knee Right Port  Result Date: 07/04/2022 CLINICAL DATA:  Right knee injury yesterday, limited range of motion EXAM: PORTABLE RIGHT KNEE - 1-2 VIEW COMPARISON:  07/03/2021 FINDINGS: Frontal, bilateral oblique, and lateral views of the right knee are obtained. No acute fracture, subluxation, or dislocation. 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 No joint effusion. There is diffuse subcutaneous edema. IMPRESSION: 1. Stable severe 3 compartmental osteoarthritis. 2. No acute bony abnormality. Electronically Signed   By: Randa Ngo M.D.   On: 07/04/2022 18:58   DG Chest Port 1 View  Result Date: 07/04/2022 CLINICAL DATA:  Hypoxia, history  of pneumonia, short of breath EXAM: PORTABLE CHEST 1 VIEW COMPARISON:  04/18/2022 FINDINGS: Single frontal view of the chest demonstrates an enlarged cardiac silhouette. There is increased central vascular congestion with bilateral perihilar airspace disease. No large effusion or pneumothorax. No acute bony abnormalities. IMPRESSION: 1. Constellation of findings most consistent with congestive heart failure. Superimposed pneumonia would be difficult to exclude. Electronically Signed   By: Randa Ngo M.D.   On: 07/04/2022 18:57    Procedures .Critical Care  Performed by: Rhae Hammock, PA-C Authorized by: Rhae Hammock, PA-C   Critical care provider statement:    Critical care time (minutes):  60   Critical care was necessary to treat or prevent imminent or life-threatening deterioration of the following conditions:  Cardiac failure and sepsis   Critical care was time spent personally by me on the following activities:  Development of treatment plan with patient or surrogate, discussions with consultants, discussions with primary provider, examination of patient, evaluation of patient's response to treatment, obtaining history from patient or surrogate, ordering and performing treatments  and interventions, ordering and review of laboratory studies, ordering and review of radiographic studies, pulse oximetry, re-evaluation of patient's condition and review of old charts   I assumed direction of critical care for this patient from another provider in my specialty: no     Care discussed with: admitting provider      Medications Ordered in ED Medications - No data to display  ED Course/ Medical Decision Making/ A&P Clinical Course as of 07/04/22 2115  Wed Jul 04, 2022  2107 Per nurse patient has desaturations to the mid 80s on her 5L O2. Respiratory will come see and she will be placed on high flow if she cannot maintain a level above 88 [MR]  2113 Nursing noted a new hypotensive reading.  Will obtain lactic and blood cultures [MR]    Clinical Course User Index [MR] Ayson Cherubini, Cecilio Asper, PA-C                           Medical Decision Making Amount and/or Complexity of Data Reviewed Labs: ordered. Radiology: ordered.  Risk Prescription drug management. Decision regarding hospitalization.   This is a 56 year old female who presents with shortness of breath.  Differential includes but is not limited to PE, ACS, dissection, pneumonia, pneumothorax, hemothorax.   This is not an exhaustive differential.    Past Medical History / Co-morbidities / Social History: Morbid obesity, congestive heart failure, sleep apnea, hypoxia, COPD   Additional history: Per chart review patient has an extensive history of respiratory failure.  Thought to be due to her weight, COPD and CHF.   Physical Exam: Pertinent physical exam findings include Morbidly obese Equal and symmetric distal pulses Mildly increased work of breathing.  Oxygen saturation low 90s on 5 L nasal cannula.  Lab Tests: I ordered, and personally interpreted labs.  The pertinent results include: Normal WBC BNP 363.9   Imaging Studies: I ordered and independently visualized and interpreted patient's chest x-ray.   Obvious pulmonary edema however I agree with the radiologist that there is no way of telling whether or not there is an underlying pneumonia.  I also ordered a radiograph of her right knee which was negative.   Cardiac Monitoring:  The patient was maintained on a cardiac monitor.  My attending physician Dr. Truett Mainland viewed and interpreted the cardiac monitored which showed an underlying rhythm of: NSR   Medications: On arrival  patient was hypoxic and given albuterol and DuoNeb inhalers.  Also complained of pain to her right knee for which she was given her home dose of hydrocodone.    IV Lasix ordered after elevated BNP and pulmonary edema on chest x-ray  MDM/Disposition: This is a 56 year old morbidly obese female who presented today due to shortness of breath.  Reported being diagnosed with pneumonia 5 days ago and has been treated with outpatient Levaquin.  Continued to become increasingly short of breath.  He wears 5 L of oxygen at baseline at home and has continued to feel short of breath.  On physical exam patient's lung sounds are distant no rales or rhonchi.  Her oxygen saturation was initially in the 80s on her baseline 5 L of oxygen.  It then increased to 92.  She is currently on 6 L and satting in the high 90s but this does drop with any movement in the bed.  X-ray showed some pulmonary edema and she was given 40 of IV Lasix.  At this time I believe she needs to be admitted to the hospital for her increased oxygen requirement/hypoxia.  This is potentially in the setting of heart failure however her BNP is around 364.  It is possible that she also has an underlying pneumonia that we are unable to visualize on the chest x-ray due to her edema.  Regardless, she requires admission to the hospital for further management.  She is agreeable.    I discussed this case with my attending physician Dr. Truett Mainland who cosigned this note including patient's presenting symptoms, physical exam, and  planned diagnostics and interventions. Attending physician stated agreement with plan or made changes to plan which were implemented.     Final Clinical Impression(s) / ED Diagnoses Final diagnoses:  Acute pulmonary edema (HCC)  Hypoxia  Congestive heart failure, unspecified HF chronicity, unspecified heart failure type [facility])    Rx / DC Orders Admit to Dr. Karma Ganja, Heavyn Yearsley A, PA-C 07/04/22 2127    Cristie Hem, MD 07/05/22 1330

## 2022-07-04 NOTE — ED Triage Notes (Signed)
Pt BIB [facility] for reported low 02 saturation. Pt has pneumonia x5 days with ATB. Pt reports being in rehabilitation for R knee injury.

## 2022-07-05 ENCOUNTER — Encounter (HOSPITAL_COMMUNITY): Payer: Self-pay | Admitting: Internal Medicine

## 2022-07-05 DIAGNOSIS — I509 Heart failure, unspecified: Secondary | ICD-10-CM

## 2022-07-05 DIAGNOSIS — J810 Acute pulmonary edema: Secondary | ICD-10-CM

## 2022-07-05 DIAGNOSIS — J9621 Acute and chronic respiratory failure with hypoxia: Secondary | ICD-10-CM

## 2022-07-05 LAB — MAGNESIUM: Magnesium: 2.2 mg/dL (ref 1.7–2.4)

## 2022-07-05 LAB — BASIC METABOLIC PANEL
Anion gap: 8 (ref 5–15)
BUN: 36 mg/dL — ABNORMAL HIGH (ref 6–20)
CO2: 39 mmol/L — ABNORMAL HIGH (ref 22–32)
Calcium: 9 mg/dL (ref 8.9–10.3)
Chloride: 91 mmol/L — ABNORMAL LOW (ref 98–111)
Creatinine, Ser: 0.91 mg/dL (ref 0.44–1.00)
GFR, Estimated: >60 mL/min (ref >60)
Glucose, Bld: 211 mg/dL — ABNORMAL HIGH (ref 70–99)
Potassium: 4.7 mmol/L (ref 3.5–5.1)
Sodium: 138 mmol/L (ref 135–145)

## 2022-07-05 LAB — CBC
HCT: 32.7 % — ABNORMAL LOW (ref 36.0–46.0)
Hemoglobin: 9.4 g/dL — ABNORMAL LOW (ref 12.0–15.0)
MCH: 25.3 pg — ABNORMAL LOW (ref 26.0–34.0)
MCHC: 28.7 g/dL — ABNORMAL LOW (ref 30.0–36.0)
MCV: 88.1 fL (ref 80.0–100.0)
Platelets: 257 10*3/uL (ref 150–400)
RBC: 3.71 MIL/uL — ABNORMAL LOW (ref 3.87–5.11)
RDW: 17.8 % — ABNORMAL HIGH (ref 11.5–15.5)
WBC: 5.8 10*3/uL (ref 4.0–10.5)
nRBC: 0 % (ref 0.0–0.2)

## 2022-07-05 LAB — CBG MONITORING, ED
Glucose-Capillary: 215 mg/dL — ABNORMAL HIGH (ref 70–99)
Glucose-Capillary: 237 mg/dL — ABNORMAL HIGH (ref 70–99)
Glucose-Capillary: 171 mg/dL — ABNORMAL HIGH (ref 70–99)

## 2022-07-05 LAB — LACTIC ACID, PLASMA: Lactic Acid, Venous: 1.4 mmol/L (ref 0.5–1.9)

## 2022-07-05 LAB — GLUCOSE, CAPILLARY: Glucose-Capillary: 106 mg/dL — ABNORMAL HIGH (ref 70–99)

## 2022-07-05 MED ORDER — PANTOPRAZOLE SODIUM 40 MG IV SOLR
40.0000 mg | Freq: Two times a day (BID) | INTRAVENOUS | Status: DC
  Administered 2022-07-05 (×2): 40 mg via INTRAVENOUS
  Filled 2022-07-05 (×2): qty 10

## 2022-07-05 MED ORDER — HYDROXYZINE HCL 25 MG PO TABS
25.0000 mg | ORAL_TABLET | Freq: Three times a day (TID) | ORAL | Status: DC | PRN
  Administered 2022-07-05: 25 mg via ORAL
  Filled 2022-07-05 (×2): qty 1

## 2022-07-05 MED ORDER — BUDESONIDE 0.5 MG/2ML IN SUSP
0.5000 mg | Freq: Two times a day (BID) | RESPIRATORY_TRACT | Status: DC
  Administered 2022-07-05 – 2022-07-10 (×10): 0.5 mg via RESPIRATORY_TRACT
  Filled 2022-07-05 (×11): qty 2

## 2022-07-05 MED ORDER — VITAMIN B-12 1000 MCG PO TABS
1000.0000 ug | ORAL_TABLET | Freq: Every day | ORAL | Status: DC
  Administered 2022-07-06 – 2022-07-10 (×5): 1000 ug via ORAL
  Filled 2022-07-05 (×5): qty 1

## 2022-07-05 MED ORDER — LEVOTHYROXINE SODIUM 75 MCG PO TABS: 150.0000 ug | ORAL_TABLET | Freq: Every day | ORAL | Status: DC

## 2022-07-05 MED ORDER — ASPIRIN 81 MG PO CHEW
81.0000 mg | CHEWABLE_TABLET | Freq: Every day | ORAL | Status: DC
  Administered 2022-07-05 – 2022-07-10 (×6): 81 mg via ORAL
  Filled 2022-07-05 (×6): qty 1

## 2022-07-05 MED ORDER — FUROSEMIDE 10 MG/ML IJ SOLN
60.0000 mg | Freq: Two times a day (BID) | INTRAMUSCULAR | Status: DC
  Administered 2022-07-05 – 2022-07-07 (×6): 60 mg via INTRAVENOUS
  Filled 2022-07-05 (×6): qty 6

## 2022-07-05 MED ORDER — ALBUTEROL SULFATE (2.5 MG/3ML) 0.083% IN NEBU: 2.5000 mg | INHALATION_SOLUTION | Freq: Four times a day (QID) | RESPIRATORY_TRACT | Status: DC | PRN

## 2022-07-05 MED ORDER — VITAMIN B-12 1000 MCG PO TABS: 1000.0000 ug | ORAL_TABLET | Freq: Every day | ORAL | Status: DC

## 2022-07-05 MED ORDER — LEVOTHYROXINE SODIUM 75 MCG PO TABS
150.0000 ug | ORAL_TABLET | Freq: Every day | ORAL | Status: DC
  Administered 2022-07-06 – 2022-07-10 (×4): 150 ug via ORAL
  Filled 2022-07-05 (×5): qty 2

## 2022-07-05 MED ORDER — APIXABAN 5 MG PO TABS
5.0000 mg | ORAL_TABLET | Freq: Two times a day (BID) | ORAL | Status: DC
  Administered 2022-07-05 – 2022-07-10 (×11): 5 mg via ORAL
  Filled 2022-07-05 (×11): qty 1

## 2022-07-05 MED ORDER — ALBUTEROL SULFATE (2.5 MG/3ML) 0.083% IN NEBU: 2.5000 mg | INHALATION_SOLUTION | RESPIRATORY_TRACT | Status: DC | PRN

## 2022-07-05 NOTE — Progress Notes (Signed)
Heart Failure Nurse Navigator Progress Note  PCP: Rudene Anda, MD PCP-Cardiologist: None Admission Diagnosis: Acute pulmonary edema, Hypoxia, Congestive heart failure Admitted from: Rehab facility via EMS  Presentation:   Chelsea [name] presented with shortness of breath, increased oxygen requirement, patient currently wears 5L oxygen at Rehab,2+  bilateral  lower extremity edema and a 20 pound weight gain. Patient has recently been treated for PNA for the last 5 days with antibiotics. Patient also reports a right knee injury from her facility when she was being moved with a hoyer lift. CXR showed increased central vascular congestion consistent with CHF. BNP 363, BMI 54, BP 113/78, HR 73,   Patient was educated on the sign and symptoms of heart failure, daily weights (states her rehab center has only weighted her 2 times since she has been there the last 3 weeks) Education done on diet/ fluid retrictions, taking all her medications as prescribed and' \\attending'$  all doctor ppointments. Patient stated her rehab center could transport her and she would use a wheel chair due to current mobility issues for which she is at rehab for. A hospital HF TOC appointment was scheduled for 07/18/22 @ 2 pm.   ECHO/ LVEF: 60-65% HFpEF  Clinical Course:  Past Medical History:  Diagnosis Date   Aortic stenosis    Echocardiogram 02/21/11:  Mean gradient 23 mm of mercury; peak gradient 36;; Turbulence noted in the area of the aortic isthmus with peak gradient 23 mmHg-consider MRI to assess for coarctation;    TEE: 04/02/11:  EF 55-60%, mod BAE, trileaflet AV with mild AS (pk and mean 25 and 16), mild AI, mild MR, mild TR, atrial septal aneurysm, no evidence of corarctation   Arthritis    "qwhere" (12/05/2017)   Asthma    Cervical cancer ([place]) 2006   CHF (congestive heart failure) (HCC)    Chronic lower back pain    Complication of anesthesia    "I have a hard time waking up from under it" (12/05/2017)   COPD  (chronic obstructive pulmonary disease) ([place])    "lung dr said I don't have this" (12/05/2017)   Coronary artery disease    Diastolic congestive heart failure ([place])    Echo 02/21/11: EF 55-60%, moderate LVH, grade 2 diastolic dysfunction, mild to moderate aortic stenosis with a mean gradient 23 mm of mercury   GERD (gastroesophageal reflux disease)    Heart murmur    Hepatitis C    History of gout X 1   Hyperlipidemia    Hypertension    Hypotension arterial 04/07/2022   Hypothyroid    IBS (irritable bowel syndrome)    Migraine    "monthly" (12/05/2017)   Obesity hypoventilation syndrome ([place])    On home oxygen therapy    "2L all the time" (12/05/2017)   OSA treated with BiPAP    "have CPAP at home too; wearing BiPAP right now" (12/05/2017)   Pneumonia    "several times" (12/05/2017)   Type II diabetes mellitus ([place])      Social History   Socioeconomic History   Marital status: Significant Other    Spouse name: Not on file   Number of children: Not on file   Years of education: Not on file   Highest education level: Not on file  Occupational History   Occupation: disabled  Tobacco Use   Smoking status: Never   Smokeless tobacco: Never  Vaping Use   Vaping Use: Never used  Substance and Sexual Activity   Alcohol use:  Not Currently   Drug use: No   Sexual activity: Not Currently  Other Topics Concern   Not on file  Social History Narrative   Not on file   Social Determinants of Health   Financial Resource Strain: Low Risk  (07/05/2022)   Overall Financial Resource Strain (CARDIA)    Difficulty of Paying Living Expenses: Not very hard  Food Insecurity: No Food Insecurity (07/05/2022)   Hunger Vital Sign    Worried About Running Out of Food in the Last Year: Never true    Ran Out of Food in the Last Year: Never true  Transportation Needs: No Transportation Needs (07/05/2022)   PRAPARE - Hydrologist (Medical): No    Lack of Transportation  (Non-Medical): No  Physical Activity: Not on file  Stress: Not on file  Social Connections: Not on file    High Risk Criteria for Readmission and/or Poor Patient Outcomes: Heart failure hospital admissions (last 6 months): 2  No Show rate: 33 % Difficult social situation: No, currently in a [place] Demonstrates medication adherence: Yes Primary Language: english Literacy level: Reading, writing, and comprehension.   Barriers of Care:   Diet/ fluid restrictions Daily weights  Considerations/Referrals:   Referral made to Heart Failure Pharmacist Stewardship: yes Referral made to Heart Failure CSW/NCM TOC: No Referral made to Heart & Vascular TOC clinic: Yes,   Items for Follow-up on DC/TOC: Diet/ fluid restrictions Daily weights Continued HF education   Earnestine Leys, BSN, RN Heart Failure Leisure centre manager Chat Only

## 2022-07-05 NOTE — Progress Notes (Signed)
Heart Failure Stewardship Pharmacist Progress Note   PCP: Rudene Anda, MD PCP-Cardiologist: None    HPI:  56 yo F with PMH of HTN, CAD, CHF, obesity, afib, HLD, T2DM, chronic pain, and sleep apnea.   Resides at [facility]. 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 Last admitted from 7/8-7/23 with bleeding from trach site. Discharged back to [facility]. States she transferred to a different rehab facility as she has improved. States she will be there for several months before being able to return home. Recently diagnosed with PNA and has been receiving treatment with Levaquin.  Presented to the ED on 10/4 with shortness of breath and reported 20 lb weight gain in 2 weeks. CXR with increased central vascular congestion consistent with CHF. Her last ECHO was done 9/12 and LVEF is 60-65% with no regional wall motion abnormalities and moderate aortic stenosis (stable since 2019).   Current HF Medications: Diuretic: furosemide 60 mg IV BID  Prior to admission HF Medications: Diuretic: furosemide 40 mg BID  Pertinent Lab Values: Serum creatinine 0.91, BUN 36, Potassium 4.7, Sodium 138, BNP 363.9, Magnesium 2.2, A1c 5.9   Vital Signs: Weight: 324 lbs (admission weight: 324 lbs) Blood pressure: 100-130/70s  Heart rate: 60-70s  I/O: -1.1L yesterday; net -2.4L  Medication Assistance / Insurance Benefits Check: Does the patient have prescription insurance?  Yes Type of insurance plan: [place] Medicaid  Assessment: 1. Acute on chronic diastolic CHF (LVEF 30-07%), due to presumed NICM. NYHA class III symptoms. - Continue furosemide 60 mg IV BID. Strict I/Os and daily weights. Keep K>4 and Mag>2. Will likely need to be transitioned to torsemide at discharge. - BP may be too soft for Entresto at this time, consider adding low dose losartan and spironolactone to optimize GDMT - Has chronic urinary retention. Chronic foley removed. Purewick in place. Caution adding SGLT2i.   Plan: 1) Medication changes  recommended at this time: - Add losartan 12.5 mg daily and spironolactone 12.5 mg daily - Trach/PEG removed - will change all meds to PO instead of Per Tube  2) Patient assistance: - None needed, resides at rehab facility that administers all meds  3)  Education  - Patient has been educated on current HF medications and potential additions to HF medication regimen - Patient verbalizes understanding that over the next few months, these medication doses may change and more medications may be added to optimize HF regimen - Patient has been educated on basic disease state pathophysiology and goals of therapy   Kerby Nora, PharmD, BCPS Heart Failure Stewardship Pharmacist Phone [phone]

## 2022-07-05 NOTE — Progress Notes (Signed)
PROGRESS NOTE    Chelsea [name]  TOI:[id number] DOB: [birth year] DOA: 07/04/2022 PCP: Rudene Anda, MD   Chief Complaint  Patient presents with   Shortness of Breath    Brief Narrative:   Chelsea [name] is a 56 y.o. with history of heart failure with preserved ejection fraction, CAD, COPD, obstructive sleep apnea, A-fib, hyperlipidemia, hypertension, type 2 diabetes, chronic pain who presented to the emergency department from her rehab facility due to hypoxia and shortness of breath.  Patient states that 5 days ago she was feeling short of breath with a dry cough.  Chest x-rays were obtained at her rehab facility and patient was started on Levaquin.  She completed a 5-day course of Levaquin without improvement in her symptoms.  Her oxygen saturations were noted to be 62% this afternoon so she was transferred to the ER for further evaluation.  She was placed on high flow nasal cannula.  On presentation she was hypoxic satting in the low to mid 80s on 5 L.  Heart rate 63, blood pressure 128/92.  Labs were obtained which were notable for negative respiratory viral panel, CMP significant for bicarb 38, glucose 202, creatinine 0.89.  CBC showed WBC 6.9, hemoglobin 9.6, platelets 272, BNP 363, blood gas showed hypercarbia with PCO2 of 64, pH 7.4.  Lactic acid was pending on admission.  Chest x-ray showed pulmonary vascular congestion with bilateral airspace disease x-ray of the right knee demonstrated no acute abnormality.   Patient states that she has gained 10 pounds over the last several days.  She also feels worsening swelling in her arms and legs as well as her abdomen.  She has been having bowel movements without difficulty.  She usually takes 40 mg of Lasix p.o. twice a day and has been compliant with this regimen.  She states that she has a CPAP/BiPAP machine at her rehab facility however it is dysfunctioning and she has not used it in the last week.    Assessment & Plan:   Principal  Problem:   Acute on chronic hypoxic respiratory failure (HCC)  Acute on chronic hypoxic hypercapnic respiratory failure  -T factorial, most likely due to volume overload, and possibly obesity hypoventilatory syndrome/sleep apnea  -Continue with BiPAP Support intermittently daytime and nightly continuous  -encouraged to use incentive spirometry and flutter valve -See discussion below regarding acute on chronic diastolic CHF  Acute on chronic diastolic CHF -Recent echocardiogram on 9/12 showed preserved EF -Continue with IV diuresis, daily weight, strict ins and out   Type 2 diabetes mellitus, insulin-dependent -Continue with Semglee and insulin sliding scale  Hypothyroidism -Continue with Synthroid  Aortic stenosis -Closely on IV diuresis  Neck A-fib -Continue with Eliquis Chronic neuropathic pain -oxycodone and gabapentin  Chronic anemia -trend CBC  Chronic constipation -continue senna docusate   DVT prophylaxis: lovenox     DVT prophylaxis: Eliquis Code Status: Full Family Communication: None at bedside Disposition:   Status is: Inpatient    Consultants:  None   Subjective:  She still reports dyspnea, she denies any chest pain  Objective: Vitals:   07/05/22 0920 07/05/22 1030 07/05/22 1130 07/05/22 1300  BP:   118/87 101/65  Pulse:  69 65 65  Resp:  '17 17 16  '$ Temp: 98.7 F (37.1 C)   98.6 F (37 C)  TempSrc: Oral     SpO2:  98% 99% 99%  Weight:      Height:        Intake/Output Summary (Last 24 hours)  at 07/05/2022 1323 Last data filed at 07/05/2022 1040 Gross per 24 hour  Intake --  Output 2350 ml  Net -2350 ml   Filed Weights   07/04/22 1730  Weight: (!) 147 kg    Examination:  Awake Alert, Oriented X 3, No new F.N deficits, Normal affect, with morbid obesity Symmetrical Chest wall movement, air entry at the bases with crackles  RRR,No Gallops,Rubs or new Murmurs, No Parasternal Heave +ve B.Sounds, Abd Soft, No tenderness, No  rebound - guarding or rigidity. Chronic  lower extremity changes, +2 edema    Data Reviewed: I have personally reviewed following labs and imaging studies  CBC: Recent Labs  Lab 07/04/22 1848 07/04/22 1858 07/04/22 2240 07/05/22 0411  WBC 6.9  --  6.1 5.8  HGB 9.6* 11.2* 9.4* 9.4*  HCT 33.7* 33.0* 32.8* 32.7*  MCV 88.7  --  88.6 88.1  PLT 272  --  268 366    Basic Metabolic Panel: Recent Labs  Lab 07/04/22 1848 07/04/22 1858 07/04/22 2240 07/05/22 0411  NA 138 137  --  138  K 4.8 4.7  --  4.7  CL 91*  --   --  91*  CO2 38*  --   --  39*  GLUCOSE 202*  --   --  211*  BUN 31*  --   --  36*  CREATININE 0.89  --  0.96 0.91  CALCIUM 9.2  --   --  9.0  MG  --   --   --  2.2    GFR: Estimated Creatinine Clearance: 98.9 mL/min (by C-G formula based on SCr of 0.91 mg/dL).  Liver Function Tests: Recent Labs  Lab 07/04/22 1848  AST 19  ALT 9  ALKPHOS 71  BILITOT 0.5  PROT 7.3  ALBUMIN 3.3*    CBG: Recent Labs  Lab 07/04/22 2202 07/05/22 0849 07/05/22 1148  GLUCAP 191* 215* 237*     Recent Results (from the past 240 hour(s))  Resp Panel by RT-PCR (Flu A&B, Covid) Anterior Nasal Swab     Status: None   Collection Time: 07/04/22  5:44 PM   Specimen: Anterior Nasal Swab  Result Value Ref Range Status   SARS Coronavirus 2 by RT PCR NEGATIVE NEGATIVE Final    Comment: (NOTE) SARS-CoV-2 target nucleic acids are NOT DETECTED.  The SARS-CoV-2 RNA is generally detectable in upper respiratory specimens during the acute phase of infection. The lowest concentration of SARS-CoV-2 viral copies this assay can detect is 138 copies/mL. A negative result does not preclude SARS-Cov-2 infection and should not be used as the sole basis for treatment or other patient management decisions. A negative result may occur with  improper specimen collection/handling, submission of specimen other than nasopharyngeal swab, presence of viral mutation(s) within the areas targeted  by this assay, and inadequate number of viral copies(<138 copies/mL). A negative result must be combined with clinical observations, patient history, and epidemiological information. The expected result is Negative.  Fact Sheet for Patients:  EntrepreneurPulse.com.au  Fact Sheet for Healthcare Providers:  IncredibleEmployment.be  This test is no t yet approved or cleared by the [place] FDA and  has been authorized for detection and/or diagnosis of SARS-CoV-2 by FDA under an Emergency Use Authorization (EUA). This EUA will remain  in effect (meaning this test can be used) for the duration of the COVID-19 declaration under Section 564(b)(1) of the Act, 21 U.S.C.section 360bbb-3(b)(1), unless the authorization is terminated  or revoked sooner.  Influenza A by PCR NEGATIVE NEGATIVE Final   Influenza B by PCR NEGATIVE NEGATIVE Final    Comment: (NOTE) The Xpert Xpress SARS-CoV-2/FLU/RSV plus assay is intended as an aid in the diagnosis of influenza from Nasopharyngeal swab specimens and should not be used as a sole basis for treatment. Nasal washings and aspirates are unacceptable for Xpert Xpress SARS-CoV-2/FLU/RSV testing.  Fact Sheet for Patients: EntrepreneurPulse.com.au  Fact Sheet for Healthcare Providers: IncredibleEmployment.be  This test is not yet approved or cleared by the [place] FDA and has been authorized for detection and/or diagnosis of SARS-CoV-2 by FDA under an Emergency Use Authorization (EUA). This EUA will remain in effect (meaning this test can be used) for the duration of the COVID-19 declaration under Section 564(b)(1) of the Act, 21 U.S.C. section 360bbb-3(b)(1), unless the authorization is terminated or revoked.  Performed at [facility], [place] [address], [place], [place] [address]   Blood culture (routine x 2)     Status: None (Preliminary result)    Collection Time: 07/04/22 10:40 PM   Specimen: BLOOD  Result Value Ref Range Status   Specimen Description BLOOD BLOOD RIGHT ARM  Final   Special Requests   Final    BOTTLES DRAWN AEROBIC AND ANAEROBIC Blood Culture adequate volume   Culture   Final    NO GROWTH < 12 HOURS Performed at [facility], [place] [address], [place],  [address]    Report Status PENDING  Incomplete         Radiology Studies: DG Knee Right Port  Result Date: 07/04/2022 CLINICAL DATA:  Right knee injury yesterday, limited range of motion EXAM: PORTABLE RIGHT KNEE - 1-2 VIEW COMPARISON:  07/03/2021 FINDINGS: Frontal, bilateral oblique, and lateral views of the right knee are obtained. No acute fracture, subluxation, or dislocation. 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 No joint effusion. There is diffuse subcutaneous edema. IMPRESSION: 1. Stable severe 3 compartmental osteoarthritis. 2. No acute bony abnormality. Electronically Signed   By: Randa Ngo M.D.   On: 07/04/2022 18:58   DG Chest Port 1 View  Result Date: 07/04/2022 CLINICAL DATA:  Hypoxia, history of pneumonia, short of breath EXAM: PORTABLE CHEST 1 VIEW COMPARISON:  04/18/2022 FINDINGS: Single frontal view of the chest demonstrates an enlarged cardiac silhouette. There is increased central vascular congestion with bilateral perihilar airspace disease. No large effusion or pneumothorax. No acute bony abnormalities. IMPRESSION: 1. Constellation of findings most consistent with congestive heart failure. Superimposed pneumonia would be difficult to exclude. Electronically Signed   By: Randa Ngo M.D.   On: 07/04/2022 18:57        Scheduled Meds:  apixaban  5 mg Oral BID   budesonide  0.5 mg Inhalation Q12H   furosemide  60 mg Intravenous BID   gabapentin  300 mg Oral TID   insulin aspart  0-24 Units Subcutaneous TID WC   Continuous Infusions:   LOS: 1 day       Phillips Climes, MD [facility]   To contact the attending provider between 7A-7P or the covering provider during after hours 7P-7A, please log into the web site [email or web address] and access using universal [place] password for that web site. If you do not have the password, please call the hospital operator.  07/05/2022, 1:23 PM

## 2022-07-05 NOTE — Progress Notes (Signed)
Heart Failure Navigator Progress Note  Following this hospitalization to assess for HV TOC readiness.   EF 60-65% HFpEF 10 pound wt gain recent PNA x5 days (on antbx) CXR : Constellation of findings consistent with CHF.   Earnestine Leys, BSN, Clinical cytogeneticist Only

## 2022-07-05 NOTE — Inpatient Diabetes Management (Signed)
Inpatient Diabetes Program Recommendations  AACE/ADA: New Consensus Statement on Inpatient Glycemic Control (2015)  Target Ranges:  Prepandial:   less than 140 mg/dL      Peak postprandial:   less than 180 mg/dL (1-2 hours)      Critically ill patients:  140 - 180 mg/dL   Lab Results  Component Value Date   GLUCAP 215 (H) 07/05/2022   HGBA1C 5.9 (H) 04/07/2022    Review of Glycemic Control  Latest Reference Range & Units 07/05/22 08:49  Glucose-Capillary 70 - 99 mg/dL 215 (H)  (H): Data is abnormally high  Diabetes history: DM2 Outpatient Diabetes medications: Lantus 20 units QD Current orders for Inpatient glycemic control: Novolog 0-24 units   Inpatient Diabetes Program Recommendations:    Novolog 0-20 units TID and 0-5 QHS Semglee 10 units QD (50% of home dose)  Will continue to follow while inpatient.  Thank you, Reche [place], MSN, [place] Diabetes Coordinator Inpatient Diabetes Program [phone] (team pager from 8a-5p)

## 2022-07-05 NOTE — ED Notes (Signed)
Pt's O2 sats dropping to 82-86% while sleeping on 5L o2 via nasal cannula. Pt reports that she uses a CPAP at home when she sleeps. RT notified of need for nighttime CPAP.

## 2022-07-05 NOTE — Progress Notes (Signed)
Heart Failure Nurse Navigator Progress Note   Spoke with patient in ED, from a rehab center, plan to return home to her significant other. Patient states she has been on a Heart healthy/ low sodium diet, and takes all her medications. Patient stated her fluid pills over the last couple weeks hasn't been working, because she hasn't been voiding much, and she has gained about 20 lbs recently, with bilateral lower legs edema. Plans are for admission, will continue to follow for HF [facility] hospital follow up appointment.    Earnestine Leys, BSN, Clinical cytogeneticist Only

## 2022-07-06 DIAGNOSIS — J9621 Acute and chronic respiratory failure with hypoxia: Secondary | ICD-10-CM | POA: Diagnosis not present

## 2022-07-06 DIAGNOSIS — I5032 Chronic diastolic (congestive) heart failure: Secondary | ICD-10-CM

## 2022-07-06 DIAGNOSIS — I5031 Acute diastolic (congestive) heart failure: Secondary | ICD-10-CM | POA: Diagnosis not present

## 2022-07-06 LAB — CBC
HCT: 29.9 % — ABNORMAL LOW (ref 36.0–46.0)
Hemoglobin: 8.6 g/dL — ABNORMAL LOW (ref 12.0–15.0)
MCH: 25.4 pg — ABNORMAL LOW (ref 26.0–34.0)
MCHC: 28.8 g/dL — ABNORMAL LOW (ref 30.0–36.0)
MCV: 88.5 fL (ref 80.0–100.0)
Platelets: 256 10*3/uL (ref 150–400)
RBC: 3.38 MIL/uL — ABNORMAL LOW (ref 3.87–5.11)
RDW: 17.6 % — ABNORMAL HIGH (ref 11.5–15.5)
WBC: 7.6 10*3/uL (ref 4.0–10.5)
nRBC: 0 % (ref 0.0–0.2)

## 2022-07-06 LAB — BASIC METABOLIC PANEL
Anion gap: 10 (ref 5–15)
BUN: 35 mg/dL — ABNORMAL HIGH (ref 6–20)
CO2: 39 mmol/L — ABNORMAL HIGH (ref 22–32)
Calcium: 9 mg/dL (ref 8.9–10.3)
Chloride: 90 mmol/L — ABNORMAL LOW (ref 98–111)
Creatinine, Ser: 0.73 mg/dL (ref 0.44–1.00)
GFR, Estimated: >60 mL/min (ref >60)
Glucose, Bld: 109 mg/dL — ABNORMAL HIGH (ref 70–99)
Potassium: 4.2 mmol/L (ref 3.5–5.1)
Sodium: 139 mmol/L (ref 135–145)

## 2022-07-06 LAB — GLUCOSE, CAPILLARY
Glucose-Capillary: 103 mg/dL — ABNORMAL HIGH (ref 70–99)
Glucose-Capillary: 176 mg/dL — ABNORMAL HIGH (ref 70–99)
Glucose-Capillary: 135 mg/dL — ABNORMAL HIGH (ref 70–99)
Glucose-Capillary: 157 mg/dL — ABNORMAL HIGH (ref 70–99)

## 2022-07-06 LAB — BLOOD CULTURE ID PANEL (REFLEXED) - BCID2

## 2022-07-06 LAB — MRSA NEXT GEN BY PCR, NASAL: MRSA by PCR Next Gen: NOT DETECTED

## 2022-07-06 MED ORDER — SENNOSIDES-DOCUSATE SODIUM 8.6-50 MG PO TABS
2.0000 | ORAL_TABLET | Freq: Two times a day (BID) | ORAL | Status: DC
  Administered 2022-07-06 – 2022-07-07 (×3): 2 via ORAL
  Filled 2022-07-06 (×9): qty 2

## 2022-07-06 MED ORDER — ALPRAZOLAM 0.5 MG PO TABS
0.5000 mg | ORAL_TABLET | Freq: Three times a day (TID) | ORAL | Status: DC | PRN
  Administered 2022-07-08 – 2022-07-10 (×2): 0.5 mg via ORAL
  Filled 2022-07-06 (×2): qty 1

## 2022-07-06 MED ORDER — PANTOPRAZOLE SODIUM 40 MG PO TBEC
40.0000 mg | DELAYED_RELEASE_TABLET | Freq: Two times a day (BID) | ORAL | Status: DC
  Administered 2022-07-06 – 2022-07-10 (×9): 40 mg via ORAL
  Filled 2022-07-06 (×9): qty 1

## 2022-07-06 NOTE — Progress Notes (Signed)
PROGRESS NOTE    Chelsea [name]  VXB:[id number] DOB: 01-17-1966 DOA: 07/04/2022 PCP: Rudene Anda, MD   Chief Complaint  Patient presents with   Shortness of Breath    Brief Narrative:   Chelsea [name] is a 56 y.o. with history of heart failure with preserved ejection fraction, CAD, COPD, obstructive sleep apnea, A-fib, hyperlipidemia, hypertension, type 2 diabetes, chronic pain who presented to the emergency department from her rehab facility due to hypoxia and shortness of breath.  Work-up significant for PCO2 64, pH of 7.4, evidence of volume overload, respiratory distress requiring BiPAP support.   Assessment & Plan:   Principal Problem:   Acute on chronic hypoxic respiratory failure (HCC) Active Problems:   Obstructive sleep apnea   Essential hypertension   GERD   Hypothyroidism   Chronic diastolic heart failure (HCC)   Morbid obesity (HCC)   Acute congestive heart failure (HCC)   Hx of atrial flutter  Acute on chronic hypoxic hypercapnic respiratory failure  -Multifactorial, most likely due to volume overload, and possibly obesity hypoventilatory syndrome/sleep apnea  -Continue with BiPAP Support intermittently daytime and nightly continuous  -encouraged to use incentive spirometry and flutter valve -Still with significant oxygen requirement this morning, back on BiPAP. -See discussion below regarding acute on chronic diastolic CHF  Acute on chronic diastolic CHF -Recent echocardiogram on 9/12 showed preserved EF -Continue with IV diuresis, at 60 mg IV twice daily, she is -3.25 L over last 24 hours, bicarb is elevated at 39, if continues to increase likely will start on acetazolamide by tomorrow  Type 2 diabetes mellitus, insulin-dependent -Continue with Semglee and insulin sliding scale  Hypothyroidism -Continue with Synthroid  Aortic stenosis -Closely on IV diuresis  Chronic A-fib -Continue with Eliquis  Chronic neuropathic pain -oxycodone and  gabapentin  Chronic anemia -trend CBC  Chronic constipation -continue senna docusate   DVT prophylaxis: lovenox     DVT prophylaxis: Eliquis Code Status: Full Family Communication: None at bedside Disposition:   Status is: Inpatient    Consultants:  None   Subjective:  Patient reports she is feeling better, this morning on heated high flow, desaturated so she is back on BiPAP  Objective: Vitals:   07/06/22 0156 07/06/22 0616 07/06/22 0800 07/06/22 1124  BP:  107/88 101/68   Pulse: (!) 59 67 64   Resp: '14 18 18   '$ Temp:  33.0 F (07.6 C) 98.3 F (36.8 C)   TempSrc:  Oral Oral   SpO2: 100% 99% 93% 92%  Weight:      Height:        Intake/Output Summary (Last 24 hours) at 07/06/2022 1344 Last data filed at 07/06/2022 0845 Gross per 24 hour  Intake 240 ml  Output 900 ml  Net -660 ml   Filed Weights   07/04/22 1730 07/06/22 0030  Weight: (!) 147 kg (!) 155.4 kg    Examination:  Awake Alert, Oriented X 3, No new F.N deficits, Normal affect Symmetrical Chest wall movement, diminished air entry at the bases bilaterally RRR,No Gallops,Rubs or new Murmurs, No Parasternal Heave +ve B.Sounds, Abd Soft, No tenderness, No rebound - guarding or rigidity. No Cyanosis, Clubbing, +2 edema with chronic skin changes    Data Reviewed: I have personally reviewed following labs and imaging studies  CBC: Recent Labs  Lab 07/04/22 1848 07/04/22 1858 07/04/22 2240 07/05/22 0411 07/06/22 0036  WBC 6.9  --  6.1 5.8 7.6  HGB 9.6* 11.2* 9.4* 9.4* 8.6*  HCT 33.7* 33.0* 32.8*  32.7* 29.9*  MCV 88.7  --  88.6 88.1 88.5  PLT 272  --  268 257 937    Basic Metabolic Panel: Recent Labs  Lab 07/04/22 1848 07/04/22 1858 07/04/22 2240 07/05/22 0411 07/06/22 0036  NA 138 137  --  138 139  K 4.8 4.7  --  4.7 4.2  CL 91*  --   --  91* 90*  CO2 38*  --   --  39* 39*  GLUCOSE 202*  --   --  211* 109*  BUN 31*  --   --  36* 35*  CREATININE 0.89  --  0.96 0.91 0.73   CALCIUM 9.2  --   --  9.0 9.0  MG  --   --   --  2.2  --     GFR: Estimated Creatinine Clearance: 116.7 mL/min (by C-G formula based on SCr of 0.73 mg/dL).  Liver Function Tests: Recent Labs  Lab 07/04/22 1848  AST 19  ALT 9  ALKPHOS 71  BILITOT 0.5  PROT 7.3  ALBUMIN 3.3*    CBG: Recent Labs  Lab 07/05/22 1148 07/05/22 1645 07/05/22 2243 07/06/22 0617 07/06/22 1106  GLUCAP 237* 171* 106* 103* 176*     Recent Results (from the past 240 hour(s))  Resp Panel by RT-PCR (Flu A&B, Covid) Anterior Nasal Swab     Status: None   Collection Time: 07/04/22  5:44 PM   Specimen: Anterior Nasal Swab  Result Value Ref Range Status   SARS Coronavirus 2 by RT PCR NEGATIVE NEGATIVE Final    Comment: (NOTE) SARS-CoV-2 target nucleic acids are NOT DETECTED.  The SARS-CoV-2 RNA is generally detectable in upper respiratory specimens during the acute phase of infection. The lowest concentration of SARS-CoV-2 viral copies this assay can detect is 138 copies/mL. A negative result does not preclude SARS-Cov-2 infection and should not be used as the sole basis for treatment or other patient management decisions. A negative result may occur with  improper specimen collection/handling, submission of specimen other than nasopharyngeal swab, presence of viral mutation(s) within the areas targeted by this assay, and inadequate number of viral copies(<138 copies/mL). A negative result must be combined with clinical observations, patient history, and epidemiological information. The expected result is Negative.  Fact Sheet for Patients:  EntrepreneurPulse.com.au  Fact Sheet for Healthcare Providers:  IncredibleEmployment.be  This test is no t yet approved or cleared by the [place] FDA and  has been authorized for detection and/or diagnosis of SARS-CoV-2 by FDA under an Emergency Use Authorization (EUA). This EUA will remain  in effect (meaning  this test can be used) for the duration of the COVID-19 declaration under Section 564(b)(1) of the Act, 21 U.S.C.section 360bbb-3(b)(1), unless the authorization is terminated  or revoked sooner.       Influenza A by PCR NEGATIVE NEGATIVE Final   Influenza B by PCR NEGATIVE NEGATIVE Final    Comment: (NOTE) The Xpert Xpress SARS-CoV-2/FLU/RSV plus assay is intended as an aid in the diagnosis of influenza from Nasopharyngeal swab specimens and should not be used as a sole basis for treatment. Nasal washings and aspirates are unacceptable for Xpert Xpress SARS-CoV-2/FLU/RSV testing.  Fact Sheet for Patients: EntrepreneurPulse.com.au  Fact Sheet for Healthcare Providers: IncredibleEmployment.be  This test is not yet approved or cleared by the [place] FDA and has been authorized for detection and/or diagnosis of SARS-CoV-2 by FDA under an Emergency Use Authorization (EUA). This EUA will remain in effect (meaning this test  can be used) for the duration of the COVID-19 declaration under Section 564(b)(1) of the Act, 21 U.S.C. section 360bbb-3(b)(1), unless the authorization is terminated or revoked.  Performed at [facility], [place] [address], [place], [place] [address]   Blood culture (routine x 2)     Status: Abnormal (Preliminary result)   Collection Time: 07/04/22 10:40 PM   Specimen: BLOOD  Result Value Ref Range Status   Specimen Description BLOOD BLOOD RIGHT ARM  Final   Special Requests   Final    BOTTLES DRAWN AEROBIC AND ANAEROBIC Blood Culture adequate volume   Culture  Setup Time   Final    GRAM POSITIVE COCCI IN CLUSTERS AEROBIC BOTTLE ONLY CRITICAL RESULT CALLED TO, READ BACK BY AND VERIFIED WITH:  C/ PHARMD E. GAGNE 07/06/22 0037 A. LAFRANCE    Culture (A)  Final    STAPHYLOCOCCUS EPIDERMIDIS THE SIGNIFICANCE OF ISOLATING THIS ORGANISM FROM A SINGLE SET OF BLOOD CULTURES WHEN MULTIPLE SETS ARE DRAWN IS UNCERTAIN.  PLEASE NOTIFY THE MICROBIOLOGY DEPARTMENT WITHIN ONE WEEK IF SPECIATION AND SENSITIVITIES ARE REQUIRED. Performed at [facility], [place] [address] [place], [place] [address]    Report Status PENDING  Incomplete  Blood Culture ID Panel (Reflexed)     Status: Abnormal   Collection Time: 07/04/22 10:40 PM  Result Value Ref Range Status   Enterococcus faecalis NOT DETECTED NOT DETECTED Final   Enterococcus Faecium NOT DETECTED NOT DETECTED Final   Listeria monocytogenes NOT DETECTED NOT DETECTED Final   Staphylococcus species DETECTED (A) NOT DETECTED Final    Comment: CRITICAL RESULT CALLED TO, READ BACK BY AND VERIFIED WITH:  C/ PHARMD E. GAGNE 07/06/22 0037 A. LAFRANCE    Staphylococcus aureus (BCID) NOT DETECTED NOT DETECTED Final   Staphylococcus epidermidis DETECTED (A) NOT DETECTED Final    Comment: Methicillin (oxacillin) resistant coagulase negative staphylococcus. Possible blood culture contaminant (unless isolated from more than one blood culture draw or clinical case suggests pathogenicity). No antibiotic treatment is indicated for blood  culture contaminants. CRITICAL RESULT CALLED TO, READ BACK BY AND VERIFIED WITH:  C/ PHARMD E. GAGNE 07/06/22 0037 A. LAFRANCE    Staphylococcus lugdunensis NOT DETECTED NOT DETECTED Final   Streptococcus species NOT DETECTED NOT DETECTED Final   Streptococcus agalactiae NOT DETECTED NOT DETECTED Final   Streptococcus pneumoniae NOT DETECTED NOT DETECTED Final   Streptococcus pyogenes NOT DETECTED NOT DETECTED Final   A.calcoaceticus-baumannii NOT DETECTED NOT DETECTED Final   Bacteroides fragilis NOT DETECTED NOT DETECTED Final   Enterobacterales NOT DETECTED NOT DETECTED Final   Enterobacter cloacae complex NOT DETECTED NOT DETECTED Final   Escherichia coli NOT DETECTED NOT DETECTED Final   Klebsiella aerogenes NOT DETECTED NOT DETECTED Final   Klebsiella oxytoca NOT DETECTED NOT DETECTED Final   Klebsiella pneumoniae NOT DETECTED  NOT DETECTED Final   Proteus species NOT DETECTED NOT DETECTED Final   Salmonella species NOT DETECTED NOT DETECTED Final   Serratia marcescens NOT DETECTED NOT DETECTED Final   Haemophilus influenzae NOT DETECTED NOT DETECTED Final   Neisseria meningitidis NOT DETECTED NOT DETECTED Final   Pseudomonas aeruginosa NOT DETECTED NOT DETECTED Final   Stenotrophomonas maltophilia NOT DETECTED NOT DETECTED Final   Candida albicans NOT DETECTED NOT DETECTED Final   Candida auris NOT DETECTED NOT DETECTED Final   Candida glabrata NOT DETECTED NOT DETECTED Final   Candida krusei NOT DETECTED NOT DETECTED Final   Candida parapsilosis NOT DETECTED NOT DETECTED Final   Candida tropicalis  NOT DETECTED NOT DETECTED Final   Cryptococcus neoformans/gattii NOT DETECTED NOT DETECTED Final   Methicillin resistance mecA/C DETECTED (A) NOT DETECTED Final    Comment: CRITICAL RESULT CALLED TO, READ BACK BY AND VERIFIED WITH:  C/ PHARMD E. GAGNE 07/06/22 0037 A. LAFRANCE Performed at [facility], [place] [address], [place], [place] [address]   Blood culture (routine x 2)     Status: None (Preliminary result)   Collection Time: 07/04/22 11:21 PM   Specimen: BLOOD RIGHT HAND  Result Value Ref Range Status   Specimen Description BLOOD RIGHT HAND  Final   Special Requests   Final    BOTTLES DRAWN AEROBIC AND ANAEROBIC Blood Culture adequate volume   Culture   Final    NO GROWTH 1 DAY Performed at [facility], [place] [address], [place], [place] [address]    Report Status PENDING  Incomplete  MRSA Next Gen by PCR, Nasal     Status: None   Collection Time: 07/05/22 10:54 PM   Specimen: Nasal Mucosa; Nasal Swab  Result Value Ref Range Status   MRSA by PCR Next Gen NOT DETECTED NOT DETECTED Final    Comment: (NOTE) The GeneXpert MRSA Assay (FDA approved for NASAL specimens only), is one component of a comprehensive MRSA colonization surveillance program. It is not intended to diagnose MRSA infection  nor to guide or monitor treatment for MRSA infections. Test performance is not FDA approved in patients less than 70 years old. Performed at [facility], [place] [address], [place], [place] [address]          Radiology Studies: DG Knee Right Port  Result Date: 07/04/2022 CLINICAL DATA:  Right knee injury yesterday, limited range of motion EXAM: PORTABLE RIGHT KNEE - 1-2 VIEW COMPARISON:  07/03/2021 FINDINGS: Frontal, bilateral oblique, and lateral views of the right knee are obtained. No acute fracture, subluxation, or dislocation. 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 No joint effusion. There is diffuse subcutaneous edema. IMPRESSION: 1. Stable severe 3 compartmental osteoarthritis. 2. No acute bony abnormality. Electronically Signed   By: Randa Ngo M.D.   On: 07/04/2022 18:58   DG Chest Port 1 View  Result Date: 07/04/2022 CLINICAL DATA:  Hypoxia, history of pneumonia, short of breath EXAM: PORTABLE CHEST 1 VIEW COMPARISON:  04/18/2022 FINDINGS: Single frontal view of the chest demonstrates an enlarged cardiac silhouette. There is increased central vascular congestion with bilateral perihilar airspace disease. No large effusion or pneumothorax. No acute bony abnormalities. IMPRESSION: 1. Constellation of findings most consistent with congestive heart failure. Superimposed pneumonia would be difficult to exclude. Electronically Signed   By: Randa Ngo M.D.   On: 07/04/2022 18:57        Scheduled Meds:  apixaban  5 mg Oral BID   aspirin  81 mg Oral Daily   budesonide  0.5 mg Inhalation Q12H   cyanocobalamin  1,000 mcg Oral Daily   furosemide  60 mg Intravenous BID   gabapentin  300 mg Oral TID   insulin aspart  0-24 Units Subcutaneous TID WC   levothyroxine  150 mcg Oral Q0600   pantoprazole  40 mg Oral BID   senna-docusate  2 tablet Oral BID   Continuous Infusions:   LOS: 2 days       Phillips Climes, MD [facility]   To contact the attending provider between 7A-7P or the covering provider during after hours 7P-7A, please log into the web site [email or web address] and  access using universal [place] password for that web site. If you do not have the password, please call the hospital operator.  07/06/2022, 1:44 PM

## 2022-07-06 NOTE — Progress Notes (Signed)
PT Cancellation Note  Patient Details Name: Chelsea [name] MRN: [id number] DOB: 11-29-1965   Cancelled Treatment:    Reason Eval/Treat Not Completed: Medical issues which prohibited therapy. Pt having a bowel movement upon PT arrival, PT will attempt to follow up as time allows.   Zenaida Niece 07/06/2022, 4:55 PM

## 2022-07-06 NOTE — Progress Notes (Signed)
Heart Failure Stewardship Pharmacist Progress Note   PCP: Rudene Anda, MD PCP-Cardiologist: None    HPI:  56 yo F with PMH of HTN, CAD, CHF, obesity, afib, HLD, T2DM, chronic pain, and sleep apnea.   Resides at [facility]. 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 Last admitted from 7/8-7/23 with bleeding from trach site. Discharged back to [facility]. States she transferred to a different rehab facility as she has improved. States she will be there for several months before being able to return home. Recently diagnosed with PNA and has been receiving treatment with Levaquin.  Presented to the ED on 10/4 with shortness of breath and reported 20 lb weight gain in 2 weeks. CXR with increased central vascular congestion consistent with CHF. Her last ECHO was done 9/12 and LVEF is 60-65% with no regional wall motion abnormalities and moderate aortic stenosis (stable since 2019).   Current HF Medications: Diuretic: furosemide 60 mg IV BID  Prior to admission HF Medications: Diuretic: furosemide 40 mg BID  Pertinent Lab Values: Serum creatinine 0.73, BUN 35, Potassium 4.2, Sodium 139, BNP 363.9, Magnesium 2.2, A1c 5.9   Vital Signs: Weight: 342 lbs - bed weight since she cannot stand (admission weight: 324 lbs) Blood pressure: 110/60s  Heart rate: 50-70s  I/O: -2.2L yesterday; net -3.3L  Medication Assistance / Insurance Benefits Check: Does the patient have prescription insurance?  Yes Type of insurance plan: [place] [employer]  Assessment: 1. Acute on chronic diastolic CHF (LVEF 51-70%), due to presumed NICM. NYHA class III symptoms. - Continue furosemide 60 mg IV BID. Strict I/Os and daily weights (although likely inaccurate since she cannot stand). Keep K>4 and Mag>2. Will likely need to be transitioned to torsemide at discharge. - BP likely too soft for Entresto at this time, consider adding low dose losartan and spironolactone to optimize GDMT - Has chronic urinary retention. Chronic foley  removed. Purewick in place. Caution adding SGLT2i.   Plan: 1) Medication changes recommended at this time: - Add losartan 12.5 mg daily and spironolactone 12.5 mg daily  2) Patient assistance: - None needed, resides at rehab facility that administers all meds  3)  Education  - Patient has been educated on current HF medications and potential additions to HF medication regimen - Patient verbalizes understanding that over the next few months, these medication doses may change and more medications may be added to optimize HF regimen - Patient has been educated on basic disease state pathophysiology and goals of therapy   Kerby Nora, PharmD, BCPS Heart Failure Stewardship Pharmacist Phone [phone]

## 2022-07-06 NOTE — Care Management Important Message (Signed)
Important Message  Patient Details  Name: Chelsea [name] MRN: [id number] Date of Birth: [birth year]   Medicare Important Message Given:  Yes     [name] 07/06/2022, 9:37 AM

## 2022-07-06 NOTE — Progress Notes (Addendum)
Patient sats dropped to 70s. Patient states she is currently trying to have bowel movement. MD and Respiratory therapist notified. Stool softener given; increased to 7L HFNC. Sats remain 70's- 80's. Respiratory at bedside; cpap placed on.

## 2022-07-06 NOTE — Progress Notes (Addendum)
PHARMACY - PHYSICIAN COMMUNICATION CRITICAL VALUE ALERT - BLOOD CULTURE IDENTIFICATION (BCID)  Chelsea [name] is an 56 y.o. female who presented to [facility] on 07/04/2022 with a chief complaint of hypoxia and shortness of breath.   Assessment: 5 days prior to admission patient was feeling short of breath at rehab facility, received CXR, and was placed on Levaquin. Completed 5 day course of Levaquin immediately prior to admission. Patient is currently afebrile and WBC is WNL.  Staph epi detected in 1 of 4 blood cx bottles, likely contamination  Name of physician Contacted: Dr. Myna Hidalgo  Current antibiotics: none  Changes to prescribed antibiotics recommended:  No changes needed at this time  Results for orders placed or performed during the hospital encounter of 07/04/22  Blood Culture ID Panel (Reflexed) (Collected: 07/04/2022 10:40 PM)  Result Value Ref Range   Enterococcus faecalis NOT DETECTED NOT DETECTED   Enterococcus Faecium NOT DETECTED NOT DETECTED   Listeria monocytogenes NOT DETECTED NOT DETECTED   Staphylococcus species DETECTED (A) NOT DETECTED   Staphylococcus aureus (BCID) NOT DETECTED NOT DETECTED   Staphylococcus epidermidis DETECTED (A) NOT DETECTED   Staphylococcus lugdunensis NOT DETECTED NOT DETECTED   Streptococcus species NOT DETECTED NOT DETECTED   Streptococcus agalactiae NOT DETECTED NOT DETECTED   Streptococcus pneumoniae NOT DETECTED NOT DETECTED   Streptococcus pyogenes NOT DETECTED NOT DETECTED   A.calcoaceticus-baumannii NOT DETECTED NOT DETECTED   Bacteroides fragilis NOT DETECTED NOT DETECTED   Enterobacterales NOT DETECTED NOT DETECTED   Enterobacter cloacae complex NOT DETECTED NOT DETECTED   Escherichia coli NOT DETECTED NOT DETECTED   Klebsiella aerogenes NOT DETECTED NOT DETECTED   Klebsiella oxytoca NOT DETECTED NOT DETECTED   Klebsiella pneumoniae NOT DETECTED NOT DETECTED   Proteus species NOT DETECTED NOT DETECTED   Salmonella species  NOT DETECTED NOT DETECTED   Serratia marcescens NOT DETECTED NOT DETECTED   Haemophilus influenzae NOT DETECTED NOT DETECTED   Neisseria meningitidis NOT DETECTED NOT DETECTED   Pseudomonas aeruginosa NOT DETECTED NOT DETECTED   Stenotrophomonas maltophilia NOT DETECTED NOT DETECTED   Candida albicans NOT DETECTED NOT DETECTED   Candida auris NOT DETECTED NOT DETECTED   Candida glabrata NOT DETECTED NOT DETECTED   Candida krusei NOT DETECTED NOT DETECTED   Candida parapsilosis NOT DETECTED NOT DETECTED   Candida tropicalis NOT DETECTED NOT DETECTED   Cryptococcus neoformans/gattii NOT DETECTED NOT DETECTED   Methicillin resistance mecA/C DETECTED (A) NOT DETECTED    Lerry Liner, PharmD Candidate class of 2024 07/06/2022  12:44 AM

## 2022-07-06 NOTE — Progress Notes (Signed)
Patient attempting to have another bowel movement but is refusing to use bed pan. Also was recently bathed but refusing to wear gown or have blankets/sheet on her. Patient told to call us when she is ready to receive assistance regarding hygiene. Call bell within reach.

## 2022-07-06 NOTE — Plan of Care (Signed)

## 2022-07-07 DIAGNOSIS — I5031 Acute diastolic (congestive) heart failure: Secondary | ICD-10-CM | POA: Diagnosis not present

## 2022-07-07 DIAGNOSIS — J9621 Acute and chronic respiratory failure with hypoxia: Secondary | ICD-10-CM | POA: Diagnosis not present

## 2022-07-07 LAB — BASIC METABOLIC PANEL
Anion gap: 10 (ref 5–15)
BUN: 25 mg/dL — ABNORMAL HIGH (ref 6–20)
CO2: 42 mmol/L — ABNORMAL HIGH (ref 22–32)
Calcium: 8.8 mg/dL — ABNORMAL LOW (ref 8.9–10.3)
Chloride: 88 mmol/L — ABNORMAL LOW (ref 98–111)
Creatinine, Ser: 0.62 mg/dL (ref 0.44–1.00)
GFR, Estimated: >60 mL/min (ref >60)
Glucose, Bld: 127 mg/dL — ABNORMAL HIGH (ref 70–99)
Potassium: 3.6 mmol/L (ref 3.5–5.1)
Sodium: 140 mmol/L (ref 135–145)

## 2022-07-07 LAB — GLUCOSE, CAPILLARY
Glucose-Capillary: 142 mg/dL — ABNORMAL HIGH (ref 70–99)
Glucose-Capillary: 143 mg/dL — ABNORMAL HIGH (ref 70–99)
Glucose-Capillary: 163 mg/dL — ABNORMAL HIGH (ref 70–99)
Glucose-Capillary: 128 mg/dL — ABNORMAL HIGH (ref 70–99)
Glucose-Capillary: 159 mg/dL — ABNORMAL HIGH (ref 70–99)

## 2022-07-07 LAB — CULTURE, BLOOD (ROUTINE X 2): Special Requests: ADEQUATE

## 2022-07-07 LAB — CBC
HCT: 31.6 % — ABNORMAL LOW (ref 36.0–46.0)
Hemoglobin: 9.4 g/dL — ABNORMAL LOW (ref 12.0–15.0)
MCH: 25.8 pg — ABNORMAL LOW (ref 26.0–34.0)
MCHC: 29.7 g/dL — ABNORMAL LOW (ref 30.0–36.0)
MCV: 86.6 fL (ref 80.0–100.0)
Platelets: 262 10*3/uL (ref 150–400)
RBC: 3.65 MIL/uL — ABNORMAL LOW (ref 3.87–5.11)
RDW: 17.3 % — ABNORMAL HIGH (ref 11.5–15.5)
WBC: 7.1 10*3/uL (ref 4.0–10.5)
nRBC: 0 % (ref 0.0–0.2)

## 2022-07-07 MED ORDER — LACTULOSE 10 GM/15ML PO SOLN
30.0000 g | Freq: Two times a day (BID) | ORAL | Status: AC
  Administered 2022-07-07: 30 g via ORAL
  Filled 2022-07-07: qty 45

## 2022-07-07 MED ORDER — ACETAZOLAMIDE 250 MG PO TABS
250.0000 mg | ORAL_TABLET | Freq: Two times a day (BID) | ORAL | Status: DC
  Administered 2022-07-07 – 2022-07-10 (×7): 250 mg via ORAL
  Filled 2022-07-07 (×7): qty 1

## 2022-07-07 MED ORDER — POLYETHYLENE GLYCOL 3350 17 G PO PACK
34.0000 g | PACK | Freq: Every day | ORAL | Status: DC
  Filled 2022-07-07 (×2): qty 2

## 2022-07-07 MED ORDER — ACETAZOLAMIDE 250 MG PO TABS: 250.0000 mg | ORAL_TABLET | Freq: Three times a day (TID) | ORAL | Status: DC

## 2022-07-07 NOTE — Evaluation (Signed)
Physical Therapy Evaluation Patient Details Name: Chelsea [name] MRN: [id number] DOB: 1966/05/19 Today's Date: 07/07/2022  History of Present Illness  56 y.o. female presents to [facility] hospital on 07/04/2022 with hypoxia and SOB, requiring use of BiPAP. Pt admitted for management of acute on chronic respiratory failure and acute on chronic diastolic CHF. PMH includes: chronic respiratory failure with hypoxia (ventilator dependent), CHF, CAD, COPD, OSA on CPAP, afib, HLD, HTN, DM II, morbid obesity, and chronic back pain.  Clinical Impression  Pt presents to PT with deficits in functional mobility, strength, power, ROM. Pt has been at [facility], working with therapists on therapeutic exercise and has been able to stand with +2 assistance 3 times. Pt currently demonstrates significant R knee ROM deficits and pain, reporting hearing a pop in the knee recently prior to admission. Pt declines attempts at sitting edge of bed or rolling, only agreeable to exercise and one attempt at transitioning to long sitting. Pt is requires significant assistance for all functional mobility tasks and will benefit from continued PT services in an effort to reduce caregiver burden and improve mobility quality.       Recommendations for follow up therapy are one component of a multi-disciplinary discharge planning process, led by the attending physician.  Recommendations may be updated based on patient status, additional functional criteria and insurance authorization.  Follow Up Recommendations Skilled nursing-short term rehab (<3 hours/day) Can patient physically be transported by private vehicle: No    Assistance Recommended at Discharge Intermittent Supervision/Assistance  Patient can return home with the following  Two people to help with walking and/or transfers;Two people to help with bathing/dressing/bathroom;Assistance with cooking/housework;Assist for transportation;Help with stairs or ramp for  entrance    Equipment Recommendations Hospital bed;Other (comment) (hoyer lift)  Recommendations for Other Services       Functional Status Assessment Patient has not had a recent decline in their functional status (decline occurred months ago, long recovery time)     Precautions / Restrictions Precautions Precautions: Fall Restrictions Weight Bearing Restrictions: No      Mobility  Bed Mobility Overal bed mobility: Needs Assistance             General bed mobility comments: pt pulls from semi-fowlers to long sitting with Bilateral hand hold from PT. Pt refuses attempts at sitting on edge of bed or rolling    Transfers                        Ambulation/Gait                  Stairs            Wheelchair Mobility    Modified Rankin (Stroke Patients Only)       Balance Overall balance assessment: Needs assistance Sitting-balance support: Bilateral upper extremity supported, Feet supported Sitting balance-Leahy Scale: Poor Sitting balance - Comments: BUE support to maintain long sitting Postural control: Posterior lean                                   Pertinent Vitals/Pain Pain Assessment Pain Assessment: Faces Faces Pain Scale: Hurts even more Pain Location: R knee Pain Descriptors / Indicators: Grimacing Pain Intervention(s): Monitored during session    Home Living Family/patient expects to be discharged to:: Skilled nursing facility  Additional Comments: from [facility]. Pt transitioned from [facility]    Prior Function Prior Level of Function : Needs assist             Mobility Comments: pt reports she has not stood with +1 assist since prior to Christmas 2022, has been able to stand 3 times with +2 assist and parallel bar at [facility] during SNF stay. Staff utilize [employer] lift to wheelchair. ADLs Comments: Pt requires assistance with bathing,  dressing, hygiene tasks, IADLs     Hand Dominance   Dominant Hand: Right    Extremity/Trunk Assessment   Upper Extremity Assessment Upper Extremity Assessment: Overall WFL for tasks assessed    Lower Extremity Assessment Lower Extremity Assessment: RLE deficits/detail;LLE deficits/detail RLE Deficits / Details: R knee ROM significantly limited, -10 degrees extension, 30-40 degrees flexion with pain and hard end feel. ankle DF/PF 3+/5, knee flexion/extension 3/5 through available range, hip flexion 3-/5. LLE Deficits / Details: ROM limited by body habitus but significalt improved compared to RLE. Grossly 3+/5, unable to perform SLR to clear heel from bed    Cervical / Trunk Assessment Cervical / Trunk Assessment: Other exceptions Cervical / Trunk Exceptions: morbid obesity  Communication   Communication: No difficulties  Cognition Arousal/Alertness: Awake/alert Behavior During Therapy: WFL for tasks assessed/performed Overall Cognitive Status: Within Functional Limits for tasks assessed                                          General Comments General comments (skin integrity, edema, etc.): VSS on 9L HFNC    Exercises General Exercises - Lower Extremity Ankle Circles/Pumps: AROM, Both, 20 reps Quad Sets: AROM, Both, 20 reps Gluteal Sets: AROM, Both, 20 reps Heel Slides: AROM, Both, 20 reps Hip ABduction/ADduction: AROM, Both, 20 reps   Assessment/Plan    PT Assessment Patient needs continued PT services  PT Problem List Decreased strength;Decreased range of motion;Decreased activity tolerance;Decreased balance;Decreased mobility       PT Treatment Interventions DME instruction;Functional mobility training;Therapeutic activities;Therapeutic exercise;Balance training;Neuromuscular re-education;Patient/family education;Wheelchair mobility training    PT Goals (Current goals can be found in the Care Plan section)  Acute Rehab PT Goals Patient Stated  Goal: to improve LE strength and be able to stand again PT Goal Formulation: With patient Time For Goal Achievement: 07/21/22 Potential to Achieve Goals: Poor    Frequency Min 2X/week     Co-evaluation               AM-PAC PT "6 Clicks" Mobility  Outcome Measure Help needed turning from your back to your side while in a flat bed without using bedrails?: Total Help needed moving from lying on your back to sitting on the side of a flat bed without using bedrails?: Total Help needed moving to and from a bed to a chair (including a wheelchair)?: Total Help needed standing up from a chair using your arms (e.g., wheelchair or bedside chair)?: Total Help needed to walk in hospital room?: Total Help needed climbing 3-5 steps with a railing? : Total 6 Click Score: 6    End of Session Equipment Utilized During Treatment: Oxygen Activity Tolerance: Patient tolerated treatment well;Other (comment) (self-limiting) Patient left: in bed;with call bell/phone within reach;with bed alarm set Nurse Communication: Mobility status;Need for lift equipment PT Visit Diagnosis: Other abnormalities of gait and mobility (R26.89);Muscle weakness (generalized) (M62.81)  Time: 7519-8242 PT Time Calculation (min) (ACUTE ONLY): 18 min   Charges:   PT Evaluation $PT Eval Low Complexity: 1 Low          Zenaida Niece, PT, DPT Acute Rehabilitation Office [phone]   Zenaida Niece 07/07/2022, 2:16 PM

## 2022-07-07 NOTE — Progress Notes (Signed)
Patient stated she was not ready for BiPAP at this time. Asked RN to call RT when patient is ready.

## 2022-07-07 NOTE — Plan of Care (Signed)
  Problem: Activity: Goal: Capacity to carry out activities will improve Outcome: Not Progressing Variance Patient/family refused or delayed decision Note: Pt resfused to get out of bed. Was repositioned in bed during bath.

## 2022-07-07 NOTE — Progress Notes (Signed)
Assessment and documentation performed by Luetta Nutting, RN as orientee under this RN's supervision. Concurred with findings and documentation.

## 2022-07-07 NOTE — Progress Notes (Signed)
PROGRESS NOTE    Chelsea [name]  MPN:[id number] DOB: 1966-03-16 DOA: 07/04/2022 PCP: Rudene Anda, MD   Chief Complaint  Patient presents with   Shortness of Breath    Brief Narrative:   Chelsea [name] is a 56 y.o. with history of heart failure with preserved ejection fraction, CAD, COPD, obstructive sleep apnea, A-fib, hyperlipidemia, hypertension, type 2 diabetes, chronic pain who presented to the emergency department from her rehab facility due to hypoxia and shortness of breath.  Work-up significant for PCO2 64, pH of 7.4, evidence of volume overload, respiratory distress requiring BiPAP support.   Assessment & Plan:   Principal Problem:   Acute on chronic hypoxic respiratory failure (HCC) Active Problems:   Obstructive sleep apnea   Essential hypertension   GERD   Hypothyroidism   Chronic diastolic heart failure (HCC)   Morbid obesity (HCC)   Acute congestive heart failure (HCC)   Hx of atrial flutter  Acute on chronic hypoxic hypercapnic respiratory failure  -Multifactorial, most likely due to volume overload, and possibly obesity hypoventilatory syndrome/sleep apnea  -Continue with BiPAP Support intermittently daytime and nightly continuous, she remains on 9 to 10 L nasal cannula this morning. -encouraged to use incentive spirometry and flutter valve -Still with significant oxygen requirement this morning, back on BiPAP. -See discussion below regarding acute on chronic diastolic CHF  Acute on chronic diastolic CHF -Recent echocardiogram on 9/12 showed preserved EF -Continue with IV diuresis, at 60 mg IV twice daily, she is negative for L over since admission. -Her contraction alkalosis closely, bicarb is 42 today, she started on acetazolamide.  Type 2 diabetes mellitus, insulin-dependent -Continue with Semglee and insulin sliding scale  Hypothyroidism -Continue with Synthroid  Aortic stenosis -Closely on IV diuresis  Chronic A-fib -Continue with  Eliquis  Chronic neuropathic pain -oxycodone and gabapentin  Chronic anemia -trend CBC  Chronic constipation -continue senna docusate  Constipation - Started on laxatives for constipation   DVT prophylaxis: lovenox     DVT prophylaxis: Eliquis Code Status: Full Family Communication: None at bedside Disposition:   Status is: Inpatient    Consultants:  None   Subjective:  Patient report poor night sleep overnight as she was constipated. Objective: Vitals:   07/06/22 2220 07/07/22 0059 07/07/22 0305 07/07/22 1100  BP:   96/67 119/71  Pulse:   77 71  Resp:   16 (!) 21  Temp:   97.8 F (36.6 C) 97.8 F (36.6 C)  TempSrc:   Oral Oral  SpO2: 97%  99% 97%  Weight:  (!) 151.7 kg    Height:        Intake/Output Summary (Last 24 hours) at 07/07/2022 1257 Last data filed at 07/07/2022 1249 Gross per 24 hour  Intake 720 ml  Output 4400 ml  Net -3680 ml   Filed Weights   07/04/22 1730 07/06/22 0030 07/07/22 0059  Weight: (!) 147 kg (!) 155.4 kg (!) 151.7 kg    Examination:  Awake Alert, Oriented X 3, No new F.N deficits, Normal affect Symmetrical Chest wall movement, Good air movement bilaterally, diminished air entry at the bases RRR,No Gallops,Rubs or new Murmurs, No Parasternal Heave +ve B.Sounds, Abd Soft, No tenderness, No rebound - guarding or rigidity. No Cyanosis, lower extremity skin changes, edema much improved   Data Reviewed: I have personally reviewed following labs and imaging studies  CBC: Recent Labs  Lab 07/04/22 1848 07/04/22 1858 07/04/22 2240 07/05/22 0411 07/06/22 0036 07/07/22 0054  WBC 6.9  --  6.1 5.8 7.6 7.1  HGB 9.6* 11.2* 9.4* 9.4* 8.6* 9.4*  HCT 33.7* 33.0* 32.8* 32.7* 29.9* 31.6*  MCV 88.7  --  88.6 88.1 88.5 86.6  PLT 272  --  268 257 256 998    Basic Metabolic Panel: Recent Labs  Lab 07/04/22 1848 07/04/22 1858 07/04/22 2240 07/05/22 0411 07/06/22 0036 07/07/22 0054  NA 138 137  --  138 139 140  K 4.8 4.7   --  4.7 4.2 3.6  CL 91*  --   --  91* 90* 88*  CO2 38*  --   --  39* 39* 42*  GLUCOSE 202*  --   --  211* 109* 127*  BUN 31*  --   --  36* 35* 25*  CREATININE 0.89  --  0.96 0.91 0.73 0.62  CALCIUM 9.2  --   --  9.0 9.0 8.8*  MG  --   --   --  2.2  --   --     GFR: Estimated Creatinine Clearance: 114.8 mL/min (by C-G formula based on SCr of 0.62 mg/dL).  Liver Function Tests: Recent Labs  Lab 07/04/22 1848  AST 19  ALT 9  ALKPHOS 71  BILITOT 0.5  PROT 7.3  ALBUMIN 3.3*    CBG: Recent Labs  Lab 07/06/22 1633 07/06/22 2110 07/07/22 0531 07/07/22 0607 07/07/22 1144  GLUCAP 135* 157* 143* 142* 163*     Recent Results (from the past 240 hour(s))  Resp Panel by RT-PCR (Flu A&B, Covid) Anterior Nasal Swab     Status: None   Collection Time: 07/04/22  5:44 PM   Specimen: Anterior Nasal Swab  Result Value Ref Range Status   SARS Coronavirus 2 by RT PCR NEGATIVE NEGATIVE Final    Comment: (NOTE) SARS-CoV-2 target nucleic acids are NOT DETECTED.  The SARS-CoV-2 RNA is generally detectable in upper respiratory specimens during the acute phase of infection. The lowest concentration of SARS-CoV-2 viral copies this assay can detect is 138 copies/mL. A negative result does not preclude SARS-Cov-2 infection and should not be used as the sole basis for treatment or other patient management decisions. A negative result may occur with  improper specimen collection/handling, submission of specimen other than nasopharyngeal swab, presence of viral mutation(s) within the areas targeted by this assay, and inadequate number of viral copies(<138 copies/mL). A negative result must be combined with clinical observations, patient history, and epidemiological information. The expected result is Negative.  Fact Sheet for Patients:  EntrepreneurPulse.com.au  Fact Sheet for Healthcare Providers:  IncredibleEmployment.be  This test is no t yet  approved or cleared by the [place] FDA and  has been authorized for detection and/or diagnosis of SARS-CoV-2 by FDA under an Emergency Use Authorization (EUA). This EUA will remain  in effect (meaning this test can be used) for the duration of the COVID-19 declaration under Section 564(b)(1) of the Act, 21 U.S.C.section 360bbb-3(b)(1), unless the authorization is terminated  or revoked sooner.       Influenza A by PCR NEGATIVE NEGATIVE Final   Influenza B by PCR NEGATIVE NEGATIVE Final    Comment: (NOTE) The Xpert Xpress SARS-CoV-2/FLU/RSV plus assay is intended as an aid in the diagnosis of influenza from Nasopharyngeal swab specimens and should not be used as a sole basis for treatment. Nasal washings and aspirates are unacceptable for Xpert Xpress SARS-CoV-2/FLU/RSV testing.  Fact Sheet for Patients: EntrepreneurPulse.com.au  Fact Sheet for Healthcare Providers: IncredibleEmployment.be  This test is not yet approved or  cleared by the [place] and has been authorized for detection and/or diagnosis of SARS-CoV-2 by FDA under an Emergency Use Authorization (EUA). This EUA will remain in effect (meaning this test can be used) for the duration of the COVID-19 declaration under Section 564(b)(1) of the Act, 21 U.S.C. section 360bbb-3(b)(1), unless the authorization is terminated or revoked.  Performed at [facility], [place] [address], [place], [place] [address]   Blood culture (routine x 2)     Status: Abnormal   Collection Time: 07/04/22 10:40 PM   Specimen: BLOOD  Result Value Ref Range Status   Specimen Description BLOOD BLOOD RIGHT ARM  Final   Special Requests   Final    BOTTLES DRAWN AEROBIC AND ANAEROBIC Blood Culture adequate volume   Culture  Setup Time   Final    GRAM POSITIVE COCCI IN CLUSTERS AEROBIC BOTTLE ONLY CRITICAL RESULT CALLED TO, READ BACK BY AND VERIFIED WITH:  C/ PHARMD E. GAGNE 07/06/22 0037 A.  LAFRANCE    Culture (A)  Final    STAPHYLOCOCCUS EPIDERMIDIS THE SIGNIFICANCE OF ISOLATING THIS ORGANISM FROM A SINGLE SET OF BLOOD CULTURES WHEN MULTIPLE SETS ARE DRAWN IS UNCERTAIN. PLEASE NOTIFY THE MICROBIOLOGY DEPARTMENT WITHIN ONE WEEK IF SPECIATION AND SENSITIVITIES ARE REQUIRED. Performed at [facility], [place] [address], [place],  [address]    Report Status 07/07/2022 FINAL  Final  Blood Culture ID Panel (Reflexed)     Status: Abnormal   Collection Time: 07/04/22 10:40 PM  Result Value Ref Range Status   Enterococcus faecalis NOT DETECTED NOT DETECTED Final   Enterococcus Faecium NOT DETECTED NOT DETECTED Final   Listeria monocytogenes NOT DETECTED NOT DETECTED Final   Staphylococcus species DETECTED (A) NOT DETECTED Final    Comment: CRITICAL RESULT CALLED TO, READ BACK BY AND VERIFIED WITH:  C/ PHARMD E. GAGNE 07/06/22 0037 A. LAFRANCE    Staphylococcus aureus (BCID) NOT DETECTED NOT DETECTED Final   Staphylococcus epidermidis DETECTED (A) NOT DETECTED Final    Comment: Methicillin (oxacillin) resistant coagulase negative staphylococcus. Possible blood culture contaminant (unless isolated from more than one blood culture draw or clinical case suggests pathogenicity). No antibiotic treatment is indicated for blood  culture contaminants. CRITICAL RESULT CALLED TO, READ BACK BY AND VERIFIED WITH:  C/ PHARMD E. GAGNE 07/06/22 0037 A. LAFRANCE    Staphylococcus lugdunensis NOT DETECTED NOT DETECTED Final   Streptococcus species NOT DETECTED NOT DETECTED Final   Streptococcus agalactiae NOT DETECTED NOT DETECTED Final   Streptococcus pneumoniae NOT DETECTED NOT DETECTED Final   Streptococcus pyogenes NOT DETECTED NOT DETECTED Final   A.calcoaceticus-baumannii NOT DETECTED NOT DETECTED Final   Bacteroides fragilis NOT DETECTED NOT DETECTED Final   Enterobacterales NOT DETECTED NOT DETECTED Final   Enterobacter cloacae complex NOT DETECTED NOT DETECTED Final    Escherichia coli NOT DETECTED NOT DETECTED Final   Klebsiella aerogenes NOT DETECTED NOT DETECTED Final   Klebsiella oxytoca NOT DETECTED NOT DETECTED Final   Klebsiella pneumoniae NOT DETECTED NOT DETECTED Final   Proteus species NOT DETECTED NOT DETECTED Final   Salmonella species NOT DETECTED NOT DETECTED Final   Serratia marcescens NOT DETECTED NOT DETECTED Final   Haemophilus influenzae NOT DETECTED NOT DETECTED Final   Neisseria meningitidis NOT DETECTED NOT DETECTED Final   Pseudomonas aeruginosa NOT DETECTED NOT DETECTED Final   Stenotrophomonas maltophilia NOT DETECTED NOT DETECTED Final   Candida albicans NOT DETECTED NOT DETECTED Final   Candida auris NOT DETECTED NOT DETECTED  Final   Candida glabrata NOT DETECTED NOT DETECTED Final   Candida krusei NOT DETECTED NOT DETECTED Final   Candida parapsilosis NOT DETECTED NOT DETECTED Final   Candida tropicalis NOT DETECTED NOT DETECTED Final   Cryptococcus neoformans/gattii NOT DETECTED NOT DETECTED Final   Methicillin resistance mecA/C DETECTED (A) NOT DETECTED Final    Comment: CRITICAL RESULT CALLED TO, READ BACK BY AND VERIFIED WITH:  C/ PHARMD E. GAGNE 07/06/22 0037 A. LAFRANCE Performed at [facility], [place] [address] [place], [place] [address]   Blood culture (routine x 2)     Status: None (Preliminary result)   Collection Time: 07/04/22 11:21 PM   Specimen: BLOOD RIGHT HAND  Result Value Ref Range Status   Specimen Description BLOOD RIGHT HAND  Final   Special Requests   Final    BOTTLES DRAWN AEROBIC AND ANAEROBIC Blood Culture adequate volume   Culture   Final    NO GROWTH 2 DAYS Performed at [facility], [place] [address] [place], [place] [address]    Report Status PENDING  Incomplete  MRSA Next Gen by PCR, Nasal     Status: None   Collection Time: 07/05/22 10:54 PM   Specimen: Nasal Mucosa; Nasal Swab  Result Value Ref Range Status   MRSA by PCR Next Gen NOT DETECTED NOT DETECTED Final    Comment:  (NOTE) The GeneXpert MRSA Assay (FDA approved for NASAL specimens only), is one component of a comprehensive MRSA colonization surveillance program. It is not intended to diagnose MRSA infection nor to guide or monitor treatment for MRSA infections. Test performance is not FDA approved in patients less than 66 years old. Performed at [facility], [place] [address], [place], [place] [address]          Radiology Studies: No results found.      Scheduled Meds:  acetaZOLAMIDE  250 mg Oral BID   apixaban  5 mg Oral BID   aspirin  81 mg Oral Daily   budesonide  0.5 mg Inhalation Q12H   cyanocobalamin  1,000 mcg Oral Daily   furosemide  60 mg Intravenous BID   gabapentin  300 mg Oral TID   insulin aspart  0-24 Units Subcutaneous TID WC   levothyroxine  150 mcg Oral Q0600   pantoprazole  40 mg Oral BID   senna-docusate  2 tablet Oral BID   Continuous Infusions:   LOS: 3 days       Phillips Climes, MD [facility]   To contact the attending provider between 7A-7P or the covering provider during after hours 7P-7A, please log into the web site [email or web address] and access using universal [place] password for that web site. If you do not have the password, please call the hospital operator.  07/07/2022, 12:57 PM

## 2022-07-08 DIAGNOSIS — Z8679 Personal history of other diseases of the circulatory system: Secondary | ICD-10-CM

## 2022-07-08 DIAGNOSIS — J9621 Acute and chronic respiratory failure with hypoxia: Secondary | ICD-10-CM | POA: Diagnosis not present

## 2022-07-08 DIAGNOSIS — I5031 Acute diastolic (congestive) heart failure: Secondary | ICD-10-CM | POA: Diagnosis not present

## 2022-07-08 LAB — BASIC METABOLIC PANEL
Anion gap: 9 (ref 5–15)
BUN: 20 mg/dL (ref 6–20)
CO2: 41 mmol/L — ABNORMAL HIGH (ref 22–32)
Calcium: 8.8 mg/dL — ABNORMAL LOW (ref 8.9–10.3)
Chloride: 89 mmol/L — ABNORMAL LOW (ref 98–111)
Creatinine, Ser: 0.65 mg/dL (ref 0.44–1.00)
GFR, Estimated: >60 mL/min (ref >60)
Glucose, Bld: 195 mg/dL — ABNORMAL HIGH (ref 70–99)
Potassium: 3.1 mmol/L — ABNORMAL LOW (ref 3.5–5.1)
Sodium: 139 mmol/L (ref 135–145)

## 2022-07-08 LAB — GLUCOSE, CAPILLARY
Glucose-Capillary: 162 mg/dL — ABNORMAL HIGH (ref 70–99)
Glucose-Capillary: 144 mg/dL — ABNORMAL HIGH (ref 70–99)
Glucose-Capillary: 137 mg/dL — ABNORMAL HIGH (ref 70–99)
Glucose-Capillary: 128 mg/dL — ABNORMAL HIGH (ref 70–99)

## 2022-07-08 LAB — MAGNESIUM: Magnesium: 2 mg/dL (ref 1.7–2.4)

## 2022-07-08 MED ORDER — FUROSEMIDE 10 MG/ML IJ SOLN
40.0000 mg | Freq: Two times a day (BID) | INTRAMUSCULAR | Status: DC
  Administered 2022-07-08 – 2022-07-09 (×3): 40 mg via INTRAVENOUS
  Filled 2022-07-08 (×3): qty 4

## 2022-07-08 MED ORDER — POTASSIUM CHLORIDE CRYS ER 20 MEQ PO TBCR
40.0000 meq | EXTENDED_RELEASE_TABLET | Freq: Four times a day (QID) | ORAL | Status: AC
  Administered 2022-07-08 (×2): 40 meq via ORAL
  Filled 2022-07-08 (×2): qty 2

## 2022-07-08 MED ORDER — BISACODYL 5 MG PO TBEC
10.0000 mg | DELAYED_RELEASE_TABLET | Freq: Once | ORAL | Status: DC
  Filled 2022-07-08: qty 2

## 2022-07-08 MED ORDER — POLYETHYLENE GLYCOL 3350 17 G PO PACK
17.0000 g | PACK | Freq: Two times a day (BID) | ORAL | Status: DC
  Filled 2022-07-08 (×3): qty 1

## 2022-07-08 NOTE — Progress Notes (Signed)
Pt will have RN contact RT when ready to wear CPAP.

## 2022-07-08 NOTE — Progress Notes (Signed)
PROGRESS NOTE    Chelsea [name]  WUX:[id number] DOB: [birth year] DOA: 07/04/2022 PCP: Rudene Anda, MD   Chief Complaint  Patient presents with   Shortness of Breath    Brief Narrative:   Chelsea [name] is a 56 y.o. with history of heart failure with preserved ejection fraction, CAD, COPD, obstructive sleep apnea, A-fib, hyperlipidemia, hypertension, type 2 diabetes, chronic pain who presented to the emergency department from her rehab facility due to hypoxia and shortness of breath.  Work-up significant for PCO2 64, pH of 7.4, evidence of volume overload, respiratory distress requiring BiPAP support.   Assessment & Plan:   Principal Problem:   Acute on chronic hypoxic respiratory failure (HCC) Active Problems:   Obstructive sleep apnea   Essential hypertension   GERD   Hypothyroidism   Chronic diastolic heart failure (HCC)   Morbid obesity (HCC)   Acute congestive heart failure (HCC)   Hx of atrial flutter  Acute on chronic hypoxic hypercapnic respiratory failure  -Multifactorial, most likely due to volume overload, and possibly obesity hypoventilatory syndrome/sleep apnea  -Continue with BiPAP Support intermittently daytime and nightly continuous, improving oxygen requirement, this morning she is on 7 L, her baseline is 4.  -encouraged to use incentive spirometry and flutter valve -See discussion below regarding acute on chronic diastolic CHF  Acute on chronic diastolic CHF -Recent echocardiogram on 9/12 showed preserved EF -I will go ahead and decrease her Lasix back to 40 mg IV twice daily given significant diuresis over last 24 hours of -5 L. -Acetazolamide started contraction alkalosis, monitor bicarb level closely .   Type 2 diabetes mellitus, insulin-dependent -Continue with Semglee and insulin sliding scale  Hypothyroidism -Continue with Synthroid  Aortic stenosis -Closely on IV diuresis  Chronic A-fib -Continue with Eliquis  Chronic neuropathic  pain -oxycodone and gabapentin  Chronic anemia -trend CBC  Chronic constipation -continue senna docusate  Constipation - Started on laxatives for constipation   DVT prophylaxis: lovenox     DVT prophylaxis: Eliquis Code Status: Full Family Communication: None at bedside Disposition:   Status is: Inpatient    Consultants:  None   Subjective:  Patient report poor night sleep overnight as she was constipated. Objective: Vitals:   07/08/22 0301 07/08/22 0746 07/08/22 0800 07/08/22 0900  BP: 115/68  108/68 108/75  Pulse: 64  60 73  Resp: '17  15 16  '$ Temp: 98.1 F (36.7 C)     TempSrc: Oral     SpO2: 95% 97% 100% 95%  Weight: (!) 146.3 kg     Height:        Intake/Output Summary (Last 24 hours) at 07/08/2022 1029 Last data filed at 07/08/2022 1023 Gross per 24 hour  Intake 1194 ml  Output 7751 ml  Net -6557 ml   Filed Weights   07/06/22 0030 07/07/22 0059 07/08/22 0301  Weight: (!) 155.4 kg (!) 151.7 kg (!) 146.3 kg    Examination:  Awake Alert, Oriented X 3, No new F.N deficits, Normal affect Symmetrical Chest wall movement, diminished air entry. RRR,No Gallops,Rubs or new Murmurs, No Parasternal Heave +ve B.Sounds, Abd Soft, No tenderness, No rebound - guarding or rigidity. No Cyanosis, edema much improved, she has chronic lower extremity changes    Data Reviewed: I have personally reviewed following labs and imaging studies  CBC: Recent Labs  Lab 07/04/22 1848 07/04/22 1858 07/04/22 2240 07/05/22 0411 07/06/22 0036 07/07/22 0054  WBC 6.9  --  6.1 5.8 7.6 7.1  HGB 9.6* 11.2* 9.4*  9.4* 8.6* 9.4*  HCT 33.7* 33.0* 32.8* 32.7* 29.9* 31.6*  MCV 88.7  --  88.6 88.1 88.5 86.6  PLT 272  --  268 257 256 403    Basic Metabolic Panel: Recent Labs  Lab 07/04/22 1848 07/04/22 1858 07/04/22 2240 07/05/22 0411 07/06/22 0036 07/07/22 0054 07/08/22 0104  NA 138 137  --  138 139 140 139  K 4.8 4.7  --  4.7 4.2 3.6 3.1*  CL 91*  --   --  91* 90*  88* 89*  CO2 38*  --   --  39* 39* 42* 41*  GLUCOSE 202*  --   --  211* 109* 127* 195*  BUN 31*  --   --  36* 35* 25* 20  CREATININE 0.89  --  0.96 0.91 0.73 0.62 0.65  CALCIUM 9.2  --   --  9.0 9.0 8.8* 8.8*  MG  --   --   --  2.2  --   --  2.0    GFR: Estimated Creatinine Clearance: 112.2 mL/min (by C-G formula based on SCr of 0.65 mg/dL).  Liver Function Tests: Recent Labs  Lab 07/04/22 1848  AST 19  ALT 9  ALKPHOS 71  BILITOT 0.5  PROT 7.3  ALBUMIN 3.3*    CBG: Recent Labs  Lab 07/07/22 0607 07/07/22 1144 07/07/22 1647 07/07/22 2119 07/08/22 0637  GLUCAP 142* 163* 128* 159* 162*     Recent Results (from the past 240 hour(s))  Resp Panel by RT-PCR (Flu A&B, Covid) Anterior Nasal Swab     Status: None   Collection Time: 07/04/22  5:44 PM   Specimen: Anterior Nasal Swab  Result Value Ref Range Status   SARS Coronavirus 2 by RT PCR NEGATIVE NEGATIVE Final    Comment: (NOTE) SARS-CoV-2 target nucleic acids are NOT DETECTED.  The SARS-CoV-2 RNA is generally detectable in upper respiratory specimens during the acute phase of infection. The lowest concentration of SARS-CoV-2 viral copies this assay can detect is 138 copies/mL. A negative result does not preclude SARS-Cov-2 infection and should not be used as the sole basis for treatment or other patient management decisions. A negative result may occur with  improper specimen collection/handling, submission of specimen other than nasopharyngeal swab, presence of viral mutation(s) within the areas targeted by this assay, and inadequate number of viral copies(<138 copies/mL). A negative result must be combined with clinical observations, patient history, and epidemiological information. The expected result is Negative.  Fact Sheet for Patients:  EntrepreneurPulse.com.au  Fact Sheet for Healthcare Providers:  IncredibleEmployment.be  This test is no t yet approved or cleared  by the [place] FDA and  has been authorized for detection and/or diagnosis of SARS-CoV-2 by FDA under an Emergency Use Authorization (EUA). This EUA will remain  in effect (meaning this test can be used) for the duration of the COVID-19 declaration under Section 564(b)(1) of the Act, 21 U.S.C.section 360bbb-3(b)(1), unless the authorization is terminated  or revoked sooner.       Influenza A by PCR NEGATIVE NEGATIVE Final   Influenza B by PCR NEGATIVE NEGATIVE Final    Comment: (NOTE) The Xpert Xpress SARS-CoV-2/FLU/RSV plus assay is intended as an aid in the diagnosis of influenza from Nasopharyngeal swab specimens and should not be used as a sole basis for treatment. Nasal washings and aspirates are unacceptable for Xpert Xpress SARS-CoV-2/FLU/RSV testing.  Fact Sheet for Patients: EntrepreneurPulse.com.au  Fact Sheet for Healthcare Providers: IncredibleEmployment.be  This test is not yet  approved or cleared by the [place] and has been authorized for detection and/or diagnosis of SARS-CoV-2 by FDA under an Emergency Use Authorization (EUA). This EUA will remain in effect (meaning this test can be used) for the duration of the COVID-19 declaration under Section 564(b)(1) of the Act, 21 U.S.C. section 360bbb-3(b)(1), unless the authorization is terminated or revoked.  Performed at [facility], [place] [address], [place], [place] [address]   Blood culture (routine x 2)     Status: Abnormal   Collection Time: 07/04/22 10:40 PM   Specimen: BLOOD  Result Value Ref Range Status   Specimen Description BLOOD BLOOD RIGHT ARM  Final   Special Requests   Final    BOTTLES DRAWN AEROBIC AND ANAEROBIC Blood Culture adequate volume   Culture  Setup Time   Final    GRAM POSITIVE COCCI IN CLUSTERS AEROBIC BOTTLE ONLY CRITICAL RESULT CALLED TO, READ BACK BY AND VERIFIED WITH:  C/ PHARMD E. GAGNE 07/06/22 0037 A. LAFRANCE     Culture (A)  Final    STAPHYLOCOCCUS EPIDERMIDIS THE SIGNIFICANCE OF ISOLATING THIS ORGANISM FROM A SINGLE SET OF BLOOD CULTURES WHEN MULTIPLE SETS ARE DRAWN IS UNCERTAIN. PLEASE NOTIFY THE MICROBIOLOGY DEPARTMENT WITHIN ONE WEEK IF SPECIATION AND SENSITIVITIES ARE REQUIRED. Performed at [facility], [place] [address], [place], [place] [address]    Report Status 07/07/2022 FINAL  Final  Blood Culture ID Panel (Reflexed)     Status: Abnormal   Collection Time: 07/04/22 10:40 PM  Result Value Ref Range Status   Enterococcus faecalis NOT DETECTED NOT DETECTED Final   Enterococcus Faecium NOT DETECTED NOT DETECTED Final   Listeria monocytogenes NOT DETECTED NOT DETECTED Final   Staphylococcus species DETECTED (A) NOT DETECTED Final    Comment: CRITICAL RESULT CALLED TO, READ BACK BY AND VERIFIED WITH:  C/ PHARMD E. GAGNE 07/06/22 0037 A. LAFRANCE    Staphylococcus aureus (BCID) NOT DETECTED NOT DETECTED Final   Staphylococcus epidermidis DETECTED (A) NOT DETECTED Final    Comment: Methicillin (oxacillin) resistant coagulase negative staphylococcus. Possible blood culture contaminant (unless isolated from more than one blood culture draw or clinical case suggests pathogenicity). No antibiotic treatment is indicated for blood  culture contaminants. CRITICAL RESULT CALLED TO, READ BACK BY AND VERIFIED WITH:  C/ PHARMD E. GAGNE 07/06/22 0037 A. LAFRANCE    Staphylococcus lugdunensis NOT DETECTED NOT DETECTED Final   Streptococcus species NOT DETECTED NOT DETECTED Final   Streptococcus agalactiae NOT DETECTED NOT DETECTED Final   Streptococcus pneumoniae NOT DETECTED NOT DETECTED Final   Streptococcus pyogenes NOT DETECTED NOT DETECTED Final   A.calcoaceticus-baumannii NOT DETECTED NOT DETECTED Final   Bacteroides fragilis NOT DETECTED NOT DETECTED Final   Enterobacterales NOT DETECTED NOT DETECTED Final   Enterobacter cloacae complex NOT DETECTED NOT DETECTED Final   Escherichia coli NOT  DETECTED NOT DETECTED Final   Klebsiella aerogenes NOT DETECTED NOT DETECTED Final   Klebsiella oxytoca NOT DETECTED NOT DETECTED Final   Klebsiella pneumoniae NOT DETECTED NOT DETECTED Final   Proteus species NOT DETECTED NOT DETECTED Final   Salmonella species NOT DETECTED NOT DETECTED Final   Serratia marcescens NOT DETECTED NOT DETECTED Final   Haemophilus influenzae NOT DETECTED NOT DETECTED Final   Neisseria meningitidis NOT DETECTED NOT DETECTED Final   Pseudomonas aeruginosa NOT DETECTED NOT DETECTED Final   Stenotrophomonas maltophilia NOT DETECTED NOT DETECTED Final   Candida albicans NOT DETECTED NOT DETECTED Final   Candida auris NOT DETECTED  NOT DETECTED Final   Candida glabrata NOT DETECTED NOT DETECTED Final   Candida krusei NOT DETECTED NOT DETECTED Final   Candida parapsilosis NOT DETECTED NOT DETECTED Final   Candida tropicalis NOT DETECTED NOT DETECTED Final   Cryptococcus neoformans/gattii NOT DETECTED NOT DETECTED Final   Methicillin resistance mecA/C DETECTED (A) NOT DETECTED Final    Comment: CRITICAL RESULT CALLED TO, READ BACK BY AND VERIFIED WITH:  C/ PHARMD E. GAGNE 07/06/22 0037 A. LAFRANCE Performed at [facility], [place] [address], [place], [place] [address]   Blood culture (routine x 2)     Status: None (Preliminary result)   Collection Time: 07/04/22 11:21 PM   Specimen: BLOOD RIGHT HAND  Result Value Ref Range Status   Specimen Description BLOOD RIGHT HAND  Final   Special Requests   Final    BOTTLES DRAWN AEROBIC AND ANAEROBIC Blood Culture adequate volume   Culture   Final    NO GROWTH 3 DAYS Performed at [facility], [place] [address] [place], [place] [address]    Report Status PENDING  Incomplete  MRSA Next Gen by PCR, Nasal     Status: None   Collection Time: 07/05/22 10:54 PM   Specimen: Nasal Mucosa; Nasal Swab  Result Value Ref Range Status   MRSA by PCR Next Gen NOT DETECTED NOT DETECTED Final    Comment: (NOTE) The  GeneXpert MRSA Assay (FDA approved for NASAL specimens only), is one component of a comprehensive MRSA colonization surveillance program. It is not intended to diagnose MRSA infection nor to guide or monitor treatment for MRSA infections. Test performance is not FDA approved in patients less than 35 years old. Performed at [facility], [place] [address], [place], [place] [address]          Radiology Studies: No results found.      Scheduled Meds:  acetaZOLAMIDE  250 mg Oral BID   apixaban  5 mg Oral BID   aspirin  81 mg Oral Daily   budesonide  0.5 mg Inhalation Q12H   cyanocobalamin  1,000 mcg Oral Daily   furosemide  40 mg Intravenous BID   gabapentin  300 mg Oral TID   insulin aspart  0-24 Units Subcutaneous TID WC   levothyroxine  150 mcg Oral Q0600   pantoprazole  40 mg Oral BID   polyethylene glycol  34 g Oral Daily   potassium chloride  40 mEq Oral Q6H   senna-docusate  2 tablet Oral BID   Continuous Infusions:   LOS: 4 days       Phillips Climes, MD [facility]   To contact the attending provider between 7A-7P or the covering provider during after hours 7P-7A, please log into the web site [email or web address] and access using universal [place] password for that web site. If you do not have the password, please call the hospital operator.  07/08/2022, 10:29 AM

## 2022-07-08 NOTE — Progress Notes (Signed)
Patient placed on full face mask BIPAP 16/8 with 6L oxygen.  Patient tolerating well at this time.

## 2022-07-09 DIAGNOSIS — I5031 Acute diastolic (congestive) heart failure: Secondary | ICD-10-CM | POA: Diagnosis not present

## 2022-07-09 DIAGNOSIS — E039 Hypothyroidism, unspecified: Secondary | ICD-10-CM | POA: Diagnosis not present

## 2022-07-09 DIAGNOSIS — K219 Gastro-esophageal reflux disease without esophagitis: Secondary | ICD-10-CM | POA: Diagnosis not present

## 2022-07-09 DIAGNOSIS — J9621 Acute and chronic respiratory failure with hypoxia: Secondary | ICD-10-CM | POA: Diagnosis not present

## 2022-07-09 LAB — GLUCOSE, CAPILLARY
Glucose-Capillary: 141 mg/dL — ABNORMAL HIGH (ref 70–99)
Glucose-Capillary: 177 mg/dL — ABNORMAL HIGH (ref 70–99)
Glucose-Capillary: 140 mg/dL — ABNORMAL HIGH (ref 70–99)
Glucose-Capillary: 166 mg/dL — ABNORMAL HIGH (ref 70–99)

## 2022-07-09 LAB — BASIC METABOLIC PANEL
Anion gap: 9 (ref 5–15)
BUN: 24 mg/dL — ABNORMAL HIGH (ref 6–20)
CO2: 41 mmol/L — ABNORMAL HIGH (ref 22–32)
Calcium: 9.5 mg/dL (ref 8.9–10.3)
Chloride: 93 mmol/L — ABNORMAL LOW (ref 98–111)
Creatinine, Ser: 1.03 mg/dL — ABNORMAL HIGH (ref 0.44–1.00)
GFR, Estimated: >60 mL/min (ref >60)
Glucose, Bld: 147 mg/dL — ABNORMAL HIGH (ref 70–99)
Potassium: 3.6 mmol/L (ref 3.5–5.1)
Sodium: 143 mmol/L (ref 135–145)

## 2022-07-09 MED ORDER — TORSEMIDE 20 MG PO TABS
40.0000 mg | ORAL_TABLET | Freq: Every day | ORAL | Status: DC
  Administered 2022-07-09 – 2022-07-10 (×2): 40 mg via ORAL
  Filled 2022-07-09 (×2): qty 2

## 2022-07-09 NOTE — Progress Notes (Signed)
Heart Failure Stewardship Pharmacist Progress Note   PCP: Rudene Anda, MD PCP-Cardiologist: None    HPI:  56 yo F with PMH of HTN, CAD, CHF, obesity, afib, HLD, T2DM, chronic pain, and sleep apnea.   Resides at [facility]. 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 Last admitted from 7/8-7/23 with bleeding from trach site. Discharged back to [facility]. States she transferred to a different rehab facility as she has improved. States she will be there for several months before being able to return home. Recently diagnosed with PNA and has been receiving treatment with Levaquin.  Presented to the ED on 10/4 with shortness of breath and reported 20 lb weight gain in 2 weeks. CXR with increased central vascular congestion consistent with CHF. Her last ECHO was done 9/12 and LVEF is 60-65% with no regional wall motion abnormalities and moderate aortic stenosis (stable since 2019).   Current HF Medications: Diuretic: torsemide 40 mg daily + diamox 250 mg BID  Prior to admission HF Medications: Diuretic: furosemide 40 mg BID  Pertinent Lab Values: Serum creatinine 1.03, BUN 24, Potassium 3.6, Sodium 143, BNP 363.9, Magnesium 2.0, A1c 5.9   Vital Signs: Weight: 318 lbs - bed weight since she cannot stand (admission weight: 324 lbs) Blood pressure: 90-100/60s  Heart rate: 60-70s  I/O: -1.5L yesterday; net -10.4L  Medication Assistance / Insurance Benefits Check: Does the patient have prescription insurance?  Yes Type of insurance plan: [place] Medicaid  Assessment: 1. Acute on chronic diastolic CHF (LVEF 54-56%), due to presumed NICM. NYHA class II symptoms. - Agree with transitioning to torsemide 40 mg daily. Continue acetazolamide 250 mg BID. CO2 41 today. Strict I/Os and daily weights (although likely inaccurate since she cannot stand). Keep K>4 and Mag>2.  - BP likely too soft for Entresto at this time, consider adding low dose losartan and spironolactone to optimize GDMT when BP stable. - Has  chronic urinary retention. Chronic foley removed. Purewick in place. Caution adding SGLT2i.   Plan: 1) Medication changes recommended at this time: - Agree with changes  2) Patient assistance: - None needed, resides at rehab facility that administers all meds  3)  Education  - Patient has been educated on current HF medications and potential additions to HF medication regimen - Patient verbalizes understanding that over the next few months, these medication doses may change and more medications may be added to optimize HF regimen - Patient has been educated on basic disease state pathophysiology and goals of therapy   Kerby Nora, PharmD, BCPS Heart Failure Stewardship Pharmacist Phone [phone]

## 2022-07-09 NOTE — Progress Notes (Signed)
Visit made to patients room to place CPAP.  Pt states she is not ready to wear at this time.

## 2022-07-09 NOTE — TOC Initial Note (Signed)
Transition of Care [facility]) - Initial/Assessment Note    Patient Details  Name: Chelsea [name] MRN: [id number] Date of Birth: Mar 17, 1966  Transition of Care [facility]) CM/SW Contact:    Bethann Berkshire, [place] Phone Number: 07/09/2022, 4:18 PM  Clinical Narrative:                  CSW met with pt and confirmed she is from [name] and plan is for her to return.   CSW contacted [facility] and confirmed pt is there for STR and can readmit. CSW notified [facility] of likely DC for tomorrow.   Expected Discharge Plan: Skilled Nursing Facility Barriers to Discharge: Continued Medical Work up   Patient Goals and CMS Choice        Expected Discharge Plan and Services Expected Discharge Plan: [place] arrangements for the past 2 months: [place]                                      Prior Living Arrangements/Services Living arrangements for the past 2 months: [place]   Patient language and need for interpreter reviewed:: Yes        Need for Family Participation in Patient Care: No (Comment) Care giver support system in place?: Yes (comment)   Criminal Activity/Legal Involvement Pertinent to Current Situation/Hospitalization: No - Comment as needed  Activities of Daily Living      Permission Sought/Granted                  Emotional Assessment Appearance:: Appears stated age Attitude/Demeanor/Rapport: Engaged Affect (typically observed): Accepting Orientation: : Oriented to Self, Oriented to Place, Oriented to Situation, Oriented to  Time Alcohol / Substance Use: Not Applicable Psych Involvement: No (comment)  Admission diagnosis:  Acute pulmonary edema (HCC) [J81.0] Hypoxia [R09.02] Congestive heart failure, unspecified HF chronicity, unspecified heart failure type ([place]) [I50.9] Acute on chronic hypoxic respiratory failure (HCC) [J96.21] Patient Active Problem List   Diagnosis Date Noted   Acute  on chronic hypoxic respiratory failure ([place]) 07/04/2022   Bleeding of the respiratory tract 04/07/2022   Acute on chronic respiratory failure with hypoxia and hypercapnia ([place]) 04/07/2022   Restrictive lung disease secondary to obesity 01/28/2022   Anemia 01/04/2022   Limitation of activity due to disability 09/18/2021   Paroxysmal atrial fibrillation ([place]) 09/04/2021   DJD (degenerative joint disease) 07/03/2021   Venous insufficiency of both lower extremities 07/02/2021   Physical deconditioning 04/24/2021   Depression 04/20/2021   History of tobacco use 04/20/2021   Hyperglycemia due to type 2 diabetes mellitus ([place]) 04/20/2021   Irritable bowel syndrome with constipation 04/20/2021   Microalbuminuria 04/20/2021   On supplemental oxygen by nasal cannula 04/20/2021   Renal insufficiency 04/20/2021   Heart failure ([place]) 04/20/2021   Moderate aortic stenosis 04/20/2021   Mild tricuspid regurgitation 04/02/2021   Mitral valve sclerosis 04/02/2021   Morbid obesity with BMI of 50.0-59.9, adult ([place]) 04/02/2021   Non-smoker 04/02/2021   Awaiting admission to adequate facility elsewhere 83/41/9622   Metabolic alkalosis 29/79/8921   Restless leg syndrome 03/22/2021   Weakness 03/20/2021   Acute congestive heart failure ([place]) 03/18/2021   Hx of atrial flutter 03/15/2021   Weight loss 03/15/2021   Atrial flutter ([place]) 03/09/2021   S/P hysterectomy 02/19/2021   Pulmonary nodule less than 6 cm determined by computed tomography of  lung 02/09/2021   Dyspnea on exertion 02/09/2021   Nocturnal hypoxemia 06/03/2020   Mixed hyperlipidemia 04/18/2020   Hair loss 09/17/2019   Chronic pain of multiple joints 06/18/2019   Irritable bowel syndrome with diarrhea 06/18/2019   Onychomycosis 06/18/2019   Acute idiopathic gout of left hand 02/16/2019   Tinea pedis of both feet 11/12/2018   Hypertension associated with diabetes ([place]) 08/25/2018   Class 3 severe obesity with serious comorbidity and  body mass index (BMI) of 50.0 to 59.9 in adult ([place]) 08/25/2018   Class 3 severe obesity due to excess calories with serious comorbidity and body mass index (BMI) of 60.0 to 69.9 in adult ([place]) 08/25/2018   Chronic respiratory failure with hypoxia ([place]) 07/02/2018   Physical debility 05/10/2018   Hepatitis C    Neuropathy due to type 2 diabetes mellitus ([place]) 02/24/2018   Chronic heart failure with preserved ejection fraction ([place]) 02/23/2018   Dyspnea 12/05/2017   Acute on chronic diastolic CHF (congestive heart failure), NYHA class 2 ([place]) 09/25/2017   Pneumonia due to gram-positive bacteria 08/24/2017   Acute respiratory failure with hypoxia and hypercarbia ([place]) 05/27/2016   Hyperlipidemia 09/28/2015   Chronic obstructive pulmonary disease, unspecified ([place]) 03/04/2015   Bronchitis 05/30/2011   Cellulitis, leg 04/29/2011   Neuropathic pain, leg 04/09/2011   Mononeuropathy of lower extremity 04/09/2011   Edema 03/22/2011   Morbid obesity ([place]) 03/22/2011   History of cervical cancer 03/22/2011   Lymphedema of both lower extremities 03/22/2011   Morbid (severe) obesity due to excess calories ([place]) 03/22/2011   Ankle pain, left 03/14/2011   Obesity hypoventilation syndrome ([place]) 01/01/2011   Osteoarthritis 11/28/2010   Chronic diastolic heart failure ([place]) 11/14/2010   Hypothyroidism 11/13/2010   UNSPEC COMBINED SYSTOLIC&DIASTOLIC HEART FAILURE 17/61/6073   HYPERTHYROIDISM 11/10/2010   DM 11/10/2010   Obstructive sleep apnea 11/10/2010   Essential hypertension 11/10/2010   GERD 11/10/2010   Gastro-esophageal reflux disease without esophagitis 11/10/2010   Essential (primary) hypertension 11/10/2010   Obstructive sleep apnea (adult) (pediatric) 11/10/2010   Type 2 diabetes mellitus, without long-term current use of insulin ([place]) 11/10/2010   PCP:  Rudene Anda, MD Pharmacy:   [id number] [place], [place] - [id number] [address] [id number]:  [phone]: [phone] [facility] [address]Phone: [phone]: [phone]    Social Determinants of Health (SDOH) Interventions Food Insecurity Interventions: Intervention Not Indicated Housing Interventions: Intervention Not Indicated Transportation Interventions: Intervention Not Indicated Alcohol Usage Interventions: Intervention Not Indicated (Score <7) Financial Strain Interventions: Intervention Not Indicated  Readmission Risk Interventions    04/09/2022    4:17 PM  Readmission Risk Prevention Plan  Transportation Screening Complete  PCP or Specialist Appt within 3-5 Days Complete  HRI or Home Care Consult Complete  Social Work Consult for [place]anning/Counseling Complete  Palliative Care Screening Not Applicable  Medication Review (Press photographer Referral to Pharmacy

## 2022-07-09 NOTE — Progress Notes (Addendum)
PROGRESS NOTE        PATIENT DETAILS Name: Chelsea [name] Age: 56 y.o. Sex: female Date of Birth: [birth year] Admit Date: 07/04/2022 Admitting Physician Emilee Hero, MD MHD:[id number], Mikeal Hawthorne, MD  Brief Summary: Patient is a 56 y.o.  female with history of HFpEF, CAD, paroxysmal A-fib, DM-2 COPD, OSA on CPAP presented with acute on chronic hypoxic respiratory failure requiring BiPAP-in the setting of volume overload/OSA.  Significant events: 10/4>> admit to [facility]-volume overload-requiring BiPAP.  Significant studies: 9/12>> Echo: EF 60-65%, moderate aortic stenosis. 10/4>> CXR>> vascular congestion. 10/4>> x-ray right knee: No acute abnormality.  Significant microbiology data: 10/4>> COVID/influenza PCR: Negative 10/4>> blood culture: 1/2-Staph epidermidis (contaminant)  Procedures: None  Consults: None  Subjective: Feels better-lying comfortably in bed.  Objective: Vitals: Blood pressure 103/63, pulse 74, temperature 97.7 F (36.5 C), temperature source Oral, resp. rate 20, height '5\' 5"'$  (1.651 m), weight (!) 144.6 kg, SpO2 100 %.   Exam: Gen Exam:Alert awake-not in any distress HEENT:atraumatic, normocephalic Chest: B/L clear to auscultation anteriorly CVS:S1S2 regular Abdomen:soft non tender, non distended Extremities:trace edema-chronic venous stasis changes. Neurology: Non focal Skin: no rash  Pertinent Labs/Radiology:    Latest Ref Rng & Units 07/07/2022   12:54 AM 07/06/2022   12:36 AM 07/05/2022    4:11 AM  CBC  WBC 4.0 - 10.5 K/uL 7.1  7.6  5.8   Hemoglobin 12.0 - 15.0 g/dL 9.4  8.6  9.4   Hematocrit 36.0 - 46.0 % 31.6  29.9  32.7   Platelets 150 - 400 K/uL 262  256  257     Lab Results  Component Value Date   NA 143 07/09/2022   K 3.6 07/09/2022   CL 93 (L) 07/09/2022   CO2 41 (H) 07/09/2022      Assessment/Plan: Acute on chronic hypoxic respiratory failure Acute on chronic diastolic heart failure OSA on  CPAP Multifactorial etiology-due to HFpEF exacerbation/underlying OSA Overall improved-discussed with nursing staff-we will try to get her back down to her usual regimen of 4 L. Continue BiPAP nightly Transition to oral diuretics today. -10 L so far, weight down to 318 pounds (342 pounds on admission)  Paroxysmal atrial fibrillation Maintaining sinus rhythm Continue Eliquis  Moderate aortic stenosis Continue outpatient follow-up with cardiology.  DM-2 (A1c 5.9 on 7/8) CBG stable-continue SSI.  Recent Labs    07/08/22 1548 07/08/22 2121 07/09/22 0618  GLUCAP 137* 128* 141*     Peripheral neuropathy Continue Neurontin  Hypothyroidism Continue levothyroxine Recent TSH suppressed-check with a.m. labs.  GERD PPI  Debility/deconditioning Per patient-she has been through numerous hospitalizations/SNF over the past several months-and now is significantly debilitated-and minimally ambulatory.  SNF planned on discharge.  Morbid Obesity: Estimated body mass index is 53.05 kg/m as calculated from the following:   Height as of this encounter: '5\' 5"'$  (1.651 m).   Weight as of this encounter: 144.6 kg.   Code status:   Code Status: Full Code   DVT Prophylaxis: SCDs Start: 07/04/22 2129 apixaban (ELIQUIS) tablet 5 mg    Family Communication: None at bedside   Disposition Plan: Status is: Inpatient Remains inpatient appropriate because: Improving volume overload/improving hypoxemia-not yet at baseline-hopefully SNF over the next few days.   Planned Discharge Destination:Skilled nursing facility   Diet: Diet Order             Diet Heart  Room service appropriate? Yes; Fluid consistency: Thin  Diet effective now                     Antimicrobial agents: Anti-infectives (From admission, onward)    None        MEDICATIONS: Scheduled Meds:  acetaZOLAMIDE  250 mg Oral BID   apixaban  5 mg Oral BID   aspirin  81 mg Oral Daily   bisacodyl  10 mg Oral Once    budesonide  0.5 mg Inhalation Q12H   cyanocobalamin  1,000 mcg Oral Daily   furosemide  40 mg Intravenous BID   gabapentin  300 mg Oral TID   insulin aspart  0-24 Units Subcutaneous TID WC   levothyroxine  150 mcg Oral Q0600   pantoprazole  40 mg Oral BID   polyethylene glycol  17 g Oral BID   senna-docusate  2 tablet Oral BID   Continuous Infusions: PRN Meds:.acetaminophen **OR** acetaminophen, albuterol, ALPRAZolam, hydrOXYzine, ondansetron **OR** ondansetron (ZOFRAN) IV   I have personally reviewed following labs and imaging studies  LABORATORY DATA: CBC: Recent Labs  Lab 07/04/22 1848 07/04/22 1858 07/04/22 2240 07/05/22 0411 07/06/22 0036 07/07/22 0054  WBC 6.9  --  6.1 5.8 7.6 7.1  HGB 9.6* 11.2* 9.4* 9.4* 8.6* 9.4*  HCT 33.7* 33.0* 32.8* 32.7* 29.9* 31.6*  MCV 88.7  --  88.6 88.1 88.5 86.6  PLT 272  --  268 257 256 295    Basic Metabolic Panel: Recent Labs  Lab 07/05/22 0411 07/06/22 0036 07/07/22 0054 07/08/22 0104 07/09/22 0344  NA 138 139 140 139 143  K 4.7 4.2 3.6 3.1* 3.6  CL 91* 90* 88* 89* 93*  CO2 39* 39* 42* 41* 41*  GLUCOSE 211* 109* 127* 195* 147*  BUN 36* 35* 25* 20 24*  CREATININE 0.91 0.73 0.62 0.65 1.03*  CALCIUM 9.0 9.0 8.8* 8.8* 9.5  MG 2.2  --   --  2.0  --     GFR: Estimated Creatinine Clearance: 86.5 mL/min (A) (by C-G formula based on SCr of 1.03 mg/dL (H)).  Liver Function Tests: Recent Labs  Lab 07/04/22 1848  AST 19  ALT 9  ALKPHOS 71  BILITOT 0.5  PROT 7.3  ALBUMIN 3.3*   No results for input(s): "LIPASE", "AMYLASE" in the last 168 hours. No results for input(s): "AMMONIA" in the last 168 hours.  Coagulation Profile: No results for input(s): "INR", "PROTIME" in the last 168 hours.  Cardiac Enzymes: No results for input(s): "CKTOTAL", "CKMB", "CKMBINDEX", "TROPONINI" in the last 168 hours.  BNP (last 3 results) No results for input(s): "PROBNP" in the last 8760 hours.  Lipid Profile: No results for  input(s): "CHOL", "HDL", "LDLCALC", "TRIG", "CHOLHDL", "LDLDIRECT" in the last 72 hours.  Thyroid Function Tests: No results for input(s): "TSH", "T4TOTAL", "FREET4", "T3FREE", "THYROIDAB" in the last 72 hours.  Anemia Panel: No results for input(s): "VITAMINB12", "FOLATE", "FERRITIN", "TIBC", "IRON", "RETICCTPCT" in the last 72 hours.  Urine analysis:    Component Value Date/Time   COLORURINE STRAW (A) 07/04/2022 2300   APPEARANCEUR CLEAR 07/04/2022 2300   LABSPEC 1.008 07/04/2022 2300   PHURINE 6.0 07/04/2022 2300   GLUCOSEU NEGATIVE 07/04/2022 2300   HGBUR NEGATIVE 07/04/2022 2300   BILIRUBINUR NEGATIVE 07/04/2022 2300   KETONESUR NEGATIVE 07/04/2022 2300   PROTEINUR NEGATIVE 07/04/2022 2300   UROBILINOGEN 0.2 12/08/2010 0545   NITRITE NEGATIVE 07/04/2022 2300   LEUKOCYTESUR NEGATIVE 07/04/2022 2300    Sepsis Labs: Lactic Acid,  Venous    Component Value Date/Time   LATICACIDVEN 1.4 07/05/2022 0035    MICROBIOLOGY: Recent Results (from the past 240 hour(s))  Resp Panel by RT-PCR (Flu A&B, Covid) Anterior Nasal Swab     Status: None   Collection Time: 07/04/22  5:44 PM   Specimen: Anterior Nasal Swab  Result Value Ref Range Status   SARS Coronavirus 2 by RT PCR NEGATIVE NEGATIVE Final    Comment: (NOTE) SARS-CoV-2 target nucleic acids are NOT DETECTED.  The SARS-CoV-2 RNA is generally detectable in upper respiratory specimens during the acute phase of infection. The lowest concentration of SARS-CoV-2 viral copies this assay can detect is 138 copies/mL. A negative result does not preclude SARS-Cov-2 infection and should not be used as the sole basis for treatment or other patient management decisions. A negative result may occur with  improper specimen collection/handling, submission of specimen other than nasopharyngeal swab, presence of viral mutation(s) within the areas targeted by this assay, and inadequate number of viral copies(<138 copies/mL). A negative  result must be combined with clinical observations, patient history, and epidemiological information. The expected result is Negative.  Fact Sheet for Patients:  EntrepreneurPulse.com.au  Fact Sheet for Healthcare Providers:  IncredibleEmployment.be  This test is no t yet approved or cleared by the [place] FDA and  has been authorized for detection and/or diagnosis of SARS-CoV-2 by FDA under an Emergency Use Authorization (EUA). This EUA will remain  in effect (meaning this test can be used) for the duration of the COVID-19 declaration under Section 564(b)(1) of the Act, 21 U.S.C.section 360bbb-3(b)(1), unless the authorization is terminated  or revoked sooner.       Influenza A by PCR NEGATIVE NEGATIVE Final   Influenza B by PCR NEGATIVE NEGATIVE Final    Comment: (NOTE) The Xpert Xpress SARS-CoV-2/FLU/RSV plus assay is intended as an aid in the diagnosis of influenza from Nasopharyngeal swab specimens and should not be used as a sole basis for treatment. Nasal washings and aspirates are unacceptable for Xpert Xpress SARS-CoV-2/FLU/RSV testing.  Fact Sheet for Patients: EntrepreneurPulse.com.au  Fact Sheet for Healthcare Providers: IncredibleEmployment.be  This test is not yet approved or cleared by the [place] FDA and has been authorized for detection and/or diagnosis of SARS-CoV-2 by FDA under an Emergency Use Authorization (EUA). This EUA will remain in effect (meaning this test can be used) for the duration of the COVID-19 declaration under Section 564(b)(1) of the Act, 21 U.S.C. section 360bbb-3(b)(1), unless the authorization is terminated or revoked.  Performed at [facility], [place] [address] [place], [place] [address]   Blood culture (routine x 2)     Status: Abnormal   Collection Time: 07/04/22 10:40 PM   Specimen: BLOOD  Result Value Ref Range Status   Specimen  Description BLOOD BLOOD RIGHT ARM  Final   Special Requests   Final    BOTTLES DRAWN AEROBIC AND ANAEROBIC Blood Culture adequate volume   Culture  Setup Time   Final    GRAM POSITIVE COCCI IN CLUSTERS AEROBIC BOTTLE ONLY CRITICAL RESULT CALLED TO, READ BACK BY AND VERIFIED WITH:  C/ PHARMD E. GAGNE 07/06/22 0037 A. LAFRANCE    Culture (A)  Final    STAPHYLOCOCCUS EPIDERMIDIS THE SIGNIFICANCE OF ISOLATING THIS ORGANISM FROM A SINGLE SET OF BLOOD CULTURES WHEN MULTIPLE SETS ARE DRAWN IS UNCERTAIN. PLEASE NOTIFY THE MICROBIOLOGY DEPARTMENT WITHIN ONE WEEK IF SPECIATION AND SENSITIVITIES ARE REQUIRED. Performed at [facility], [place] [address],  [place], [place] [address]    Report Status 07/07/2022 FINAL  Final  Blood Culture ID Panel (Reflexed)     Status: Abnormal   Collection Time: 07/04/22 10:40 PM  Result Value Ref Range Status   Enterococcus faecalis NOT DETECTED NOT DETECTED Final   Enterococcus Faecium NOT DETECTED NOT DETECTED Final   Listeria monocytogenes NOT DETECTED NOT DETECTED Final   Staphylococcus species DETECTED (A) NOT DETECTED Final    Comment: CRITICAL RESULT CALLED TO, READ BACK BY AND VERIFIED WITH:  C/ PHARMD E. GAGNE 07/06/22 0037 A. LAFRANCE    Staphylococcus aureus (BCID) NOT DETECTED NOT DETECTED Final   Staphylococcus epidermidis DETECTED (A) NOT DETECTED Final    Comment: Methicillin (oxacillin) resistant coagulase negative staphylococcus. Possible blood culture contaminant (unless isolated from more than one blood culture draw or clinical case suggests pathogenicity). No antibiotic treatment is indicated for blood  culture contaminants. CRITICAL RESULT CALLED TO, READ BACK BY AND VERIFIED WITH:  C/ PHARMD E. GAGNE 07/06/22 0037 A. LAFRANCE    Staphylococcus lugdunensis NOT DETECTED NOT DETECTED Final   Streptococcus species NOT DETECTED NOT DETECTED Final   Streptococcus agalactiae NOT DETECTED NOT DETECTED Final   Streptococcus pneumoniae NOT  DETECTED NOT DETECTED Final   Streptococcus pyogenes NOT DETECTED NOT DETECTED Final   A.calcoaceticus-baumannii NOT DETECTED NOT DETECTED Final   Bacteroides fragilis NOT DETECTED NOT DETECTED Final   Enterobacterales NOT DETECTED NOT DETECTED Final   Enterobacter cloacae complex NOT DETECTED NOT DETECTED Final   Escherichia coli NOT DETECTED NOT DETECTED Final   Klebsiella aerogenes NOT DETECTED NOT DETECTED Final   Klebsiella oxytoca NOT DETECTED NOT DETECTED Final   Klebsiella pneumoniae NOT DETECTED NOT DETECTED Final   Proteus species NOT DETECTED NOT DETECTED Final   Salmonella species NOT DETECTED NOT DETECTED Final   Serratia marcescens NOT DETECTED NOT DETECTED Final   Haemophilus influenzae NOT DETECTED NOT DETECTED Final   Neisseria meningitidis NOT DETECTED NOT DETECTED Final   Pseudomonas aeruginosa NOT DETECTED NOT DETECTED Final   Stenotrophomonas maltophilia NOT DETECTED NOT DETECTED Final   Candida albicans NOT DETECTED NOT DETECTED Final   Candida auris NOT DETECTED NOT DETECTED Final   Candida glabrata NOT DETECTED NOT DETECTED Final   Candida krusei NOT DETECTED NOT DETECTED Final   Candida parapsilosis NOT DETECTED NOT DETECTED Final   Candida tropicalis NOT DETECTED NOT DETECTED Final   Cryptococcus neoformans/gattii NOT DETECTED NOT DETECTED Final   Methicillin resistance mecA/C DETECTED (A) NOT DETECTED Final    Comment: CRITICAL RESULT CALLED TO, READ BACK BY AND VERIFIED WITH:  C/ PHARMD E. GAGNE 07/06/22 0037 A. LAFRANCE Performed at [facility], [place] [address], [place], [place] [address]   Blood culture (routine x 2)     Status: None (Preliminary result)   Collection Time: 07/04/22 11:21 PM   Specimen: BLOOD RIGHT HAND  Result Value Ref Range Status   Specimen Description BLOOD RIGHT HAND  Final   Special Requests   Final    BOTTLES DRAWN AEROBIC AND ANAEROBIC Blood Culture adequate volume   Culture   Final    NO GROWTH 3 DAYS Performed at  [facility], [place] [address], [place], [place] [address]    Report Status PENDING  Incomplete  MRSA Next Gen by PCR, Nasal     Status: None   Collection Time: 07/05/22 10:54 PM   Specimen: Nasal Mucosa; Nasal Swab  Result Value Ref Range Status   MRSA by PCR Next Gen  NOT DETECTED NOT DETECTED Final    Comment: (NOTE) The GeneXpert MRSA Assay (FDA approved for NASAL specimens only), is one component of a comprehensive MRSA colonization surveillance program. It is not intended to diagnose MRSA infection nor to guide or monitor treatment for MRSA infections. Test performance is not FDA approved in patients less than 74 years old. Performed at [facility], [place] [address] [place], [place] [address]     RADIOLOGY STUDIES/RESULTS: No results found.   LOS: 5 days   Oren Binet, MD  [facility]    To contact the attending provider between 7A-7P or the covering provider during after hours 7P-7A, please log into the web site [email or web address] and access using universal [place] password for that web site. If you do not have the password, please call the hospital operator.  07/09/2022, 9:08 AM

## 2022-07-10 DIAGNOSIS — I10 Essential (primary) hypertension: Secondary | ICD-10-CM

## 2022-07-10 LAB — BASIC METABOLIC PANEL
Anion gap: 11 (ref 5–15)
BUN: 39 mg/dL — ABNORMAL HIGH (ref 6–20)
CO2: 35 mmol/L — ABNORMAL HIGH (ref 22–32)
Calcium: 8.6 mg/dL — ABNORMAL LOW (ref 8.9–10.3)
Chloride: 93 mmol/L — ABNORMAL LOW (ref 98–111)
Creatinine, Ser: 0.91 mg/dL (ref 0.44–1.00)
GFR, Estimated: >60 mL/min (ref >60)
Glucose, Bld: 164 mg/dL — ABNORMAL HIGH (ref 70–99)
Potassium: 3.7 mmol/L (ref 3.5–5.1)
Sodium: 139 mmol/L (ref 135–145)

## 2022-07-10 LAB — TSH: TSH: 1.918 u[IU]/mL (ref 0.350–4.500)

## 2022-07-10 LAB — CULTURE, BLOOD (ROUTINE X 2)
Culture: NO GROWTH
Special Requests: ADEQUATE

## 2022-07-10 LAB — GLUCOSE, CAPILLARY
Glucose-Capillary: 135 mg/dL — ABNORMAL HIGH (ref 70–99)
Glucose-Capillary: 177 mg/dL — ABNORMAL HIGH (ref 70–99)
Glucose-Capillary: 217 mg/dL — ABNORMAL HIGH (ref 70–99)

## 2022-07-10 MED ORDER — SENNOSIDES-DOCUSATE SODIUM 8.6-50 MG PO TABS: 2.0000 | ORAL_TABLET | Freq: Two times a day (BID) | ORAL | Status: DC

## 2022-07-10 MED ORDER — PANTOPRAZOLE SODIUM 40 MG PO TBEC: 40.0000 mg | DELAYED_RELEASE_TABLET | Freq: Two times a day (BID) | ORAL | Status: DC

## 2022-07-10 MED ORDER — TORSEMIDE 40 MG PO TABS: 40.0000 mg | ORAL_TABLET | Freq: Every day | ORAL | Status: DC

## 2022-07-10 MED ORDER — VITAMIN B-12 1000 MCG PO TABS: 1000.0000 ug | ORAL_TABLET | Freq: Every day | ORAL | Status: DC

## 2022-07-10 MED ORDER — CHOLECALCIFEROL 125 MCG (5000 UT) PO TABS: 5000.0000 [IU] | ORAL_TABLET | Freq: Every day | ORAL | Status: AC

## 2022-07-10 MED ORDER — GUAIFENESIN 100 MG/5ML PO LIQD: 10.0000 mL | ORAL | 0 refills | Status: DC | PRN

## 2022-07-10 MED ORDER — INSULIN ASPART 100 UNIT/ML IJ SOLN: INTRAMUSCULAR | 11 refills | Status: DC

## 2022-07-10 MED ORDER — POLYETHYLENE GLYCOL 3350 17 G PO PACK: 17.0000 g | PACK | Freq: Every day | ORAL | 0 refills | Status: DC

## 2022-07-10 MED ORDER — ALPRAZOLAM 0.5 MG PO TABS: 0.5000 mg | ORAL_TABLET | Freq: Three times a day (TID) | ORAL | 0 refills | Status: DC | PRN

## 2022-07-10 MED ORDER — POTASSIUM CHLORIDE CRYS ER 20 MEQ PO TBCR: 20.0000 meq | EXTENDED_RELEASE_TABLET | Freq: Every day | ORAL | Status: DC

## 2022-07-10 MED ORDER — FOLIC ACID 1 MG PO TABS: 1.0000 mg | ORAL_TABLET | Freq: Every day | ORAL | Status: DC

## 2022-07-10 MED ORDER — LEVOTHYROXINE SODIUM 150 MCG PO TABS: 150.0000 ug | ORAL_TABLET | Freq: Every day | ORAL | Status: AC

## 2022-07-10 NOTE — Progress Notes (Signed)
Heart Failure Stewardship Pharmacist Progress Note   PCP: Rudene Anda, MD PCP-Cardiologist: None    HPI:  56 yo F with PMH of HTN, CAD, CHF, obesity, afib, HLD, T2DM, chronic pain, and sleep apnea.   Resides at [facility]. 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 Last admitted from 7/8-7/23 with bleeding from trach site. Discharged back to [facility]. States she transferred to a different rehab facility as she has improved. States she will be there for several months before being able to return home. Recently diagnosed with PNA and has been receiving treatment with Levaquin.  Presented to the ED on 10/4 with shortness of breath and reported 20 lb weight gain in 2 weeks. CXR with increased central vascular congestion consistent with CHF. Her last ECHO was done 9/12 and LVEF is 60-65% with no regional wall motion abnormalities and moderate aortic stenosis (stable since 2019).   Current HF Medications: Diuretic: torsemide 40 mg daily + diamox 250 mg BID  Prior to admission HF Medications: Diuretic: furosemide 40 mg BID  Pertinent Lab Values: Serum creatinine 0.91, BUN 39, Potassium 3.7, Sodium 139, BNP 363.9, Magnesium 2.0, A1c 5.9   Vital Signs: Weight: 318 lbs - bed weight since she cannot stand (admission weight: 324 lbs) Blood pressure: 80/50-100/80s  Heart rate: 60-70s  I/O: -3.6L yesterday; net -12.4L  Medication Assistance / Insurance Benefits Check: Does the patient have prescription insurance?  Yes Type of insurance plan: [place] Medicaid  Assessment: 1. Acute on chronic diastolic CHF (LVEF 63-33%), due to presumed NICM. NYHA class II symptoms. - Continue torsemide 40 mg daily. Continue acetazolamide 250 mg BID. CO2 35 today. Strict I/Os and daily weights (although likely inaccurate since she cannot stand). Keep K>4 and Mag>2. Recommend KCl 40 mEq x 1. - BP too soft for Entresto at this time, consider adding low dose losartan and spironolactone to optimize GDMT when BP stable. - Has  chronic urinary retention. Chronic foley removed. Purewick in place. Caution adding SGLT2i.   Plan: 1) Medication changes recommended at this time: -  KCl 40 mEq x 1  2) Patient assistance: - None needed, resides at rehab facility that administers all meds  3)  Education  - Patient has been educated on current HF medications and potential additions to HF medication regimen - Patient verbalizes understanding that over the next few months, these medication doses may change and more medications may be added to optimize HF regimen - Patient has been educated on basic disease state pathophysiology and goals of therapy   Kerby Nora, PharmD, BCPS Heart Failure Stewardship Pharmacist Phone [phone]

## 2022-07-10 NOTE — Progress Notes (Signed)
Pt being d/c, VSS, IV removed, Report called to [name], AVS printed for PTAR.  Alvis Lemmings, RN 07/10/2022

## 2022-07-10 NOTE — NC FL2 (Signed)
[place] LEVEL OF CARE SCREENING TOOL     IDENTIFICATION  Patient Name: Chelsea [name] Birthdate: [birth year] Sex: female Admission Date (Current Location): 07/04/2022  [facility] and [place] Number:  Herbalist and Address:  [facility], [place] [address], [place], [place] [address]      Provider Number: [id number]  Attending Physician Name and Address:  [name], MD  Relative Name and Phone Number:       Current Level of Care: Hospital Recommended Level of Care: [place] Prior Approval Number:    Date Approved/Denied:   PASRR Number: [id number]  Discharge Plan: SNF    Current Diagnoses: Patient Active Problem List   Diagnosis Date Noted   Acute on chronic hypoxic respiratory failure ([place]) 07/04/2022   Bleeding of the respiratory tract 04/07/2022   Acute on chronic respiratory failure with hypoxia and hypercapnia ([place]) 04/07/2022   Restrictive lung disease secondary to obesity 01/28/2022   Anemia 01/04/2022   Limitation of activity due to disability 09/18/2021   Paroxysmal atrial fibrillation ([place]) 09/04/2021   DJD (degenerative joint disease) 07/03/2021   Venous insufficiency of both lower extremities 07/02/2021   Physical deconditioning 04/24/2021   Depression 04/20/2021   History of tobacco use 04/20/2021   Hyperglycemia due to type 2 diabetes mellitus ([place]) 04/20/2021   Irritable bowel syndrome with constipation 04/20/2021   Microalbuminuria 04/20/2021   On supplemental oxygen by nasal cannula 04/20/2021   Renal insufficiency 04/20/2021   Heart failure ([place]) 04/20/2021   Moderate aortic stenosis 04/20/2021   Mild tricuspid regurgitation 04/02/2021   Mitral valve sclerosis 04/02/2021   Morbid obesity with BMI of 50.0-59.9, adult ([place]) 04/02/2021   Non-smoker 04/02/2021   Awaiting admission to adequate facility elsewhere 67/61/9509   Metabolic alkalosis 32/67/1245   Restless leg  syndrome 03/22/2021   Weakness 03/20/2021   Acute congestive heart failure ([place]) 03/18/2021   Hx of atrial flutter 03/15/2021   Weight loss 03/15/2021   Atrial flutter ([place]) 03/09/2021   S/P hysterectomy 02/19/2021   Pulmonary nodule less than 6 cm determined by computed tomography of lung 02/09/2021   Dyspnea on exertion 02/09/2021   Nocturnal hypoxemia 06/03/2020   Mixed hyperlipidemia 04/18/2020   Hair loss 09/17/2019   Chronic pain of multiple joints 06/18/2019   Irritable bowel syndrome with diarrhea 06/18/2019   Onychomycosis 06/18/2019   Acute idiopathic gout of left hand 02/16/2019   Tinea pedis of both feet 11/12/2018   Hypertension associated with diabetes ([place]) 08/25/2018   Class 3 severe obesity with serious comorbidity and body mass index (BMI) of 50.0 to 59.9 in adult ([place]) 08/25/2018   Class 3 severe obesity due to excess calories with serious comorbidity and body mass index (BMI) of 60.0 to 69.9 in adult ([place]) 08/25/2018   Chronic respiratory failure with hypoxia ([place]) 07/02/2018   Physical debility 05/10/2018   Hepatitis C    Neuropathy due to type 2 diabetes mellitus ([place]) 02/24/2018   Chronic heart failure with preserved ejection fraction ([place]) 02/23/2018   Dyspnea 12/05/2017   Acute on chronic diastolic CHF (congestive heart failure), NYHA class 2 ([place]) 09/25/2017   Pneumonia due to gram-positive bacteria 08/24/2017   Acute respiratory failure with hypoxia and hypercarbia ([place]) 05/27/2016   Hyperlipidemia 09/28/2015   Chronic obstructive pulmonary disease, unspecified ([place]) 03/04/2015   Bronchitis 05/30/2011   Cellulitis, leg 04/29/2011   Neuropathic pain, leg 04/09/2011   Mononeuropathy of lower extremity 04/09/2011   Edema 03/22/2011  Morbid obesity ([place]) 03/22/2011   History of cervical cancer 03/22/2011   Lymphedema of both lower extremities 03/22/2011   Morbid (severe) obesity due to excess calories ([place]) 03/22/2011   Ankle pain, left 03/14/2011    Obesity hypoventilation syndrome ([place]) 01/01/2011   Osteoarthritis 11/28/2010   Chronic diastolic heart failure ([place]) 11/14/2010   Hypothyroidism 11/13/2010   UNSPEC COMBINED SYSTOLIC&DIASTOLIC HEART FAILURE 83/38/2505   HYPERTHYROIDISM 11/10/2010   DM 11/10/2010   Obstructive sleep apnea 11/10/2010   Essential hypertension 11/10/2010   GERD 11/10/2010   Gastro-esophageal reflux disease without esophagitis 11/10/2010   Essential (primary) hypertension 11/10/2010   Obstructive sleep apnea (adult) (pediatric) 11/10/2010   Type 2 diabetes mellitus, without long-term current use of insulin ([place]) 11/10/2010    Orientation RESPIRATION BLADDER Height & Weight     Self, Time, Situation, Place  Normal Continent, External catheter Weight: (!) 318 lb 12.6 oz (144.6 kg) Height:  '5\' 5"'$  (165.1 cm)  BEHAVIORAL SYMPTOMS/MOOD NEUROLOGICAL BOWEL NUTRITION STATUS      Continent Diet (see d/c summary)  AMBULATORY STATUS COMMUNICATION OF NEEDS Skin   Extensive Assist Verbally Normal                       Personal Care Assistance Level of Assistance  Bathing, Feeding, Dressing Bathing Assistance: Limited assistance Feeding assistance: Independent Dressing Assistance: Limited assistance     Functional Limitations Info  Sight, Hearing, Speech Sight Info: Adequate Hearing Info: Adequate Speech Info: Adequate    SPECIAL CARE FACTORS FREQUENCY  OT (By licensed OT), PT (By licensed PT)     PT Frequency: 5x/week OT Frequency: 5x/week            Contractures Contractures Info: Not present    Additional Factors Info  Code Status, Allergies Code Status Info: full code Allergies Info: Iodinated Contrast Media, penecillins, Insulin Lispro, Minoxidil           Current Medications (07/10/2022):  This is the current hospital active medication list Current Facility-Administered Medications  Medication Dose Route Frequency Provider Last Rate Last Admin   acetaminophen (TYLENOL) tablet  650 mg  650 mg Oral Q6H PRN Emilee Hero, MD   650 mg at 07/10/22 3976   Or   acetaminophen (TYLENOL) suppository 650 mg  650 mg Rectal Q6H PRN Emilee Hero, MD       acetaZOLAMIDE (DIAMOX) tablet 250 mg  250 mg Oral BID Elgergawy, Silver Huguenin, MD   250 mg at 07/10/22 0834   albuterol (PROVENTIL) (2.5 MG/3ML) 0.083% nebulizer solution 2.5 mg  2.5 mg Nebulization Q2H PRN Elgergawy, Silver Huguenin, MD       ALPRAZolam Duanne Moron) tablet 0.5 mg  0.5 mg Oral Q8H PRN Elgergawy, Silver Huguenin, MD   0.5 mg at 07/10/22 0054   apixaban (ELIQUIS) tablet 5 mg  5 mg Oral BID Elgergawy, Silver Huguenin, MD   5 mg at 07/10/22 7341   aspirin chewable tablet 81 mg  81 mg Oral Daily Elgergawy, Silver Huguenin, MD   81 mg at 07/10/22 0834   bisacodyl (DULCOLAX) EC tablet 10 mg  10 mg Oral Once Elgergawy, Silver Huguenin, MD       budesonide (PULMICORT) nebulizer solution 0.5 mg  0.5 mg Inhalation Q12H Elgergawy, Silver Huguenin, MD   0.5 mg at 07/10/22 0849   cyanocobalamin (VITAMIN B12) tablet 1,000 mcg  1,000 mcg Oral Daily Elgergawy, Silver Huguenin, MD   1,000 mcg at 07/10/22 0835   gabapentin (NEURONTIN) capsule 300 mg  300  mg Oral TID Emilee Hero, MD   300 mg at 07/10/22 0835   hydrOXYzine (ATARAX) tablet 25 mg  25 mg Oral TID PRN Emilee Hero, MD   25 mg at 07/05/22 0155   insulin aspart (novoLOG) injection 0-24 Units  0-24 Units Subcutaneous TID WC Emilee Hero, MD   4 Units at 07/10/22 0843   levothyroxine (SYNTHROID) tablet 150 mcg  150 mcg Oral Q0600 Elgergawy, Silver Huguenin, MD   150 mcg at 07/10/22 0556   ondansetron (ZOFRAN) tablet 4 mg  4 mg Oral Q6H PRN Emilee Hero, MD       Or   ondansetron (ZOFRAN) injection 4 mg  4 mg Intravenous Q6H PRN Emilee Hero, MD       pantoprazole (PROTONIX) EC tablet 40 mg  40 mg Oral BID Ventura Sellers, RPH   40 mg at 07/10/22 3016   polyethylene glycol (MIRALAX / GLYCOLAX) packet 17 g  17 g Oral BID Elgergawy, Silver Huguenin, MD       senna-docusate (Senokot-S) tablet 2 tablet  2 tablet Oral BID  Elgergawy, Silver Huguenin, MD   2 tablet at 07/07/22 1037   torsemide (DEMADEX) tablet 40 mg  40 mg Oral Daily [name], MD   40 mg at 07/10/22 0109     Discharge Medications: Please see discharge summary for a list of discharge medications.  Relevant Imaging Results:  Relevant Lab Results:   Additional Information SS# [id number] pt weighs 352lb bibap nightly   [place], LCSW

## 2022-07-10 NOTE — TOC Transition Note (Signed)
Transition of Care [facility]) - CM/SW Discharge Note   Patient Details  Name: Chelsea [name] MRN: [id number] Date of Birth: 1966-08-22  [facility] CM/SW Contact:  Bethann Berkshire, [place] Phone Number: 07/10/2022, 10:34 AM   Clinical Narrative:     Patient will DC to: [facility] Anticipated DC date: 07/10/22 Transport by: [name]   Per MD patient ready for DC to [name]. RN, patient, and facility notified of DC. Discharge Summary and FL2 sent to facility. RN to call report prior to discharge ([phone] room 101b). DC packet on chart. Ambulance transport requested for patient.   CSW will sign off for now as social work intervention is no longer needed. Please consult [place] again if new needs arise.   Final next level of care: Skilled Nursing Facility Barriers to Discharge: No Barriers Identified   Patient Goals and CMS Choice        Discharge Placement              Patient [facility] at: [facility] Patient to be transferred to facility by: [facility]   Patient and family notified of of transfer: 07/10/22  Discharge Plan and Services                                     Social Determinants of Health (SDOH) Interventions Food Insecurity Interventions: Intervention Not Indicated Housing Interventions: Intervention Not Indicated Transportation Interventions: Intervention Not Indicated Alcohol Usage Interventions: Intervention Not Indicated (Score <7) Financial Strain Interventions: Intervention Not Indicated   Readmission Risk Interventions    04/09/2022    4:17 PM  Readmission Risk Prevention Plan  Transportation Screening Complete  PCP or Specialist Appt within 3-5 Days Complete  HRI or Home Care Consult Complete  Social Work Consult for [place] Planning/Counseling Complete  Palliative Care Screening Not Applicable  Medication Review Press photographer) Referral to Pharmacy

## 2022-07-10 NOTE — Discharge Summary (Signed)
PATIENT DETAILS Name: Chelsea [name] Age: 56 y.o. Sex: female Date of Birth: 1966-07-24 MRN: [id number]. Admitting Physician: Emilee Hero, MD TKW:IOXB, Mikeal Hawthorne, MD  Admit Date: 07/04/2022 Discharge date: 07/10/2022  Recommendations for Outpatient Follow-up:  Follow up with PCP in 1-2 weeks Please obtain CMP/CBC in one week  Admitted From:  SNF  Disposition: Skilled nursing facility   Discharge Condition: fair  CODE STATUS:   Code Status: Full Code   Diet recommendation:  Diet Order             Diet - low sodium heart healthy           Diet Carb Modified           Diet Heart Room service appropriate? Yes; Fluid consistency: Thin  Diet effective now                    Brief Summary: Patient is a 56 y.o.  female with history of HFpEF, CAD, paroxysmal A-fib, DM-2 COPD, OSA on CPAP presented with acute on chronic hypoxic respiratory failure requiring BiPAP-in the setting of volume overload/OSA.   Significant events: 10/4>> admit to [facility]-volume overload-requiring BiPAP.   Significant studies: 9/12>> Echo: EF 60-65%, moderate aortic stenosis. 10/4>> CXR>> vascular congestion. 10/4>> x-ray right knee: No acute abnormality.   Significant microbiology data: 10/4>> COVID/influenza PCR: Negative 10/4>> blood culture: 1/2-Staph epidermidis (contaminant)   Procedures: None   Consults: None  Brief Hospital Course: Acute on chronic hypoxic respiratory failure Acute on chronic diastolic heart failure OSA on CPAP Multifactorial etiology-due to HFpEF exacerbation/underlying OSA Managed with IV diuretics-significantly better-back to to her usual home regimen of 4 L.  Remains on BiPAP nightly.  Has been transitioned to oral diuretics.  Weight stable at 318 pounds overnight (342 pounds on admission) -13 L so far.     Paroxysmal atrial fibrillation Maintaining sinus rhythm Continue Eliquis   Moderate aortic stenosis Continue outpatient follow-up with  cardiology.   DM-2 (A1c 5.9 on 7/8) CBG stable-continue SSI.  Peripheral neuropathy Continue Neurontin   Hypothyroidism Continue levothyroxine TSH stable   GERD PPI   Debility/deconditioning Per patient-she has been through numerous hospitalizations/SNF over the past several months-and now is significantly debilitated-and minimally ambulatory.  SNF planned on discharge.   Morbid Obesity: Estimated body mass index is 53.05 kg/m as calculated from the following:   Height as of this encounter: '5\' 5"'$  (1.651 m).   Weight as of this encounter: 144.6 kg.     Discharge Diagnoses:  Principal Problem:   Acute on chronic hypoxic respiratory failure (HCC) Active Problems:   Obstructive sleep apnea   Essential hypertension   GERD   Hypothyroidism   Chronic diastolic heart failure (HCC)   Morbid obesity (HCC)   Acute congestive heart failure (HCC)   Hx of atrial flutter   Discharge Instructions:  Activity:  As tolerated  Check CBGs before meals and at bedtime  Discharge Instructions     (HEART FAILURE PATIENTS) Call MD:  Anytime you have any of the following symptoms: 1) 3 pound weight gain in 24 hours or 5 pounds in 1 week 2) shortness of breath, with or without a dry hacking cough 3) swelling in the hands, feet or stomach 4) if you have to sleep on extra pillows at night in order to breathe.   Complete by: As directed    Call MD for:  difficulty breathing, headache or visual disturbances   Complete by: As directed    Diet -  low sodium heart healthy   Complete by: As directed    Diet Carb Modified   Complete by: As directed    Discharge instructions   Complete by: As directed    Follow with Primary MD  Rudene Anda, MD in 1-2 weeks  CPAP nightly  O2 4L 24/7  Please get a complete blood count and chemistry panel checked by your Primary MD at your next visit, and again as instructed by your Primary MD.  Get Medicines reviewed and adjusted: Please take all your  medications with you for your next visit with your Primary MD  Laboratory/radiological data: Please request your Primary MD to go over all hospital tests and procedure/radiological results at the follow up, please ask your Primary MD to get all Hospital records sent to his/her office.  In some cases, they will be blood work, cultures and biopsy results pending at the time of your discharge. Please request that your primary care M.D. follows up on these results.  Also Note the following: If you experience worsening of your admission symptoms, develop shortness of breath, life threatening emergency, suicidal or homicidal thoughts you must seek medical attention immediately by calling 911 or calling your MD immediately  if symptoms less severe.  You must read complete instructions/literature along with all the possible adverse reactions/side effects for all the Medicines you take and that have been prescribed to you. Take any new Medicines after you have completely understood and accpet all the possible adverse reactions/side effects.   Do not drive when taking Pain medications or sleeping medications (Benzodaizepines)  Do not take more than prescribed Pain, Sleep and Anxiety Medications. It is not advisable to combine anxiety,sleep and pain medications without talking with your primary care practitioner  Special Instructions: If you have smoked or chewed Tobacco  in the last 2 yrs please stop smoking, stop any regular Alcohol  and or any Recreational drug use.  Wear Seat belts while driving.  Please note: You were cared for by a hospitalist during your hospital stay. Once you are discharged, your primary care physician will handle any further medical issues. Please note that NO REFILLS for any discharge medications will be authorized once you are discharged, as it is imperative that you return to your primary care physician (or establish a relationship with a primary care physician if you do not  have one) for your post hospital discharge needs so that they can reassess your need for medications and monitor your lab values.   Increase activity slowly   Complete by: As directed       Allergies as of 07/10/2022       Reactions   Iodinated Contrast Media Anaphylaxis   Penicillins Anaphylaxis   Patient tolerates cefepime (08/2017)   Insulin Lispro Other (See Comments)   Adder per [facility] from SNF   Minoxidil    Other reaction(s): hives        Medication List     STOP taking these medications    feeding supplement (VITAL 1.5 CAL) Liqd   furosemide 10 MG/ML injection Commonly known as: LASIX   insulin glargine 100 UNIT/ML injection Commonly known as: LANTUS   pantoprazole 40 MG injection Commonly known as: PROTONIX Replaced by: pantoprazole 40 MG tablet   Potassium Bicarb-Citric Acid 10 MEQ Tbef       TAKE these medications    Acetaminophen 650 MG/20.3ML Soln Place 650 mg into feeding tube every 6 (six) hours as needed for mild pain.   albuterol (2.5  MG/3ML) 0.083% nebulizer solution Commonly known as: PROVENTIL Take 3 mLs (2.5 mg total) by nebulization every 6 (six) hours as needed for wheezing or shortness of breath (I50.32).   ALPRAZolam 0.5 MG tablet Commonly known as: XANAX Take 1 tablet (0.5 mg total) by mouth every 8 (eight) hours as needed.   aspirin 81 MG tablet Take 81 mg by mouth daily.   BIOFREEZE EX Apply 1 application  topically as needed (fo pain).   budesonide 0.5 MG/2ML nebulizer solution Commonly known as: PULMICORT Inhale 0.5 mg into the lungs every 12 (twelve) hours.   Cholecalciferol 125 MCG (5000 UT) Tabs Take 1 tablet (5,000 Units total) by mouth daily. What changed: how to take this   CULTURELLE PROBIOTICS PO Take 1 tablet by mouth daily as needed (for probiotic levels).   cyanocobalamin 1000 MCG tablet Commonly known as: VITAMIN B12 Take 1 tablet (1,000 mcg total) by mouth daily. What changed: how to take this    diclofenac Sodium 1 % Gel Commonly known as: VOLTAREN Apply 4 g topically every 6 (six) hours as needed (pain).   Eliquis 5 MG Tabs tablet Generic drug: apixaban Take 5 mg by mouth 2 (two) times daily.   folic acid 1 MG tablet Commonly known as: FOLVITE Take 1 tablet (1 mg total) by mouth daily. What changed: how to take this   gabapentin 400 MG capsule Commonly known as: NEURONTIN Take 400 mg by mouth 3 (three) times daily.   GLUCAGON HCL IJ Inject 1 mg into the skin daily as needed (Hypoglycemia).   guaiFENesin 100 MG/5ML liquid Commonly known as: ROBITUSSIN Take 10 mLs by mouth every 4 (four) hours as needed for cough or to loosen phlegm. What changed: how to take this   insulin aspart 100 UNIT/ML injection Commonly known as: novoLOG insulin aspart (novoLOG) injection 0-24 Units 0-24 Units, Subcutaneous, 3 times daily with meals, CBG < 70: Implement Hypoglycemia measures, call MD CBG < 120 (dose in units): 0 CBG 120 - 160 (dose in units): 2 CBG 161 - 200 (dose in units): 4 CBG 201 - 250 (dose in units): 8 CBG 251 - 300 (dose in units): 12 CBG 301 - 350 (dose in units): 16 CBG 351 - 450 (dose in units): 20 CBG > 450: 24 units and obtain STAT glucose and notify MD   IRON FORMULA PO Take 1 tablet by mouth daily as needed (for iron levels.).   levothyroxine 150 MCG tablet Commonly known as: SYNTHROID Take 1 tablet (150 mcg total) by mouth daily at 6 (six) AM. What changed: how to take this   MILK OF MAGNESIA PO Take 1 tablet by mouth as needed (for milk of magnesia levels).   pantoprazole 40 MG tablet Commonly known as: PROTONIX Take 1 tablet (40 mg total) by mouth 2 (two) times daily. Replaces: pantoprazole 40 MG injection   polyethylene glycol 17 g packet Commonly known as: MIRALAX / GLYCOLAX Take 17 g by mouth daily.   potassium chloride SA 20 MEQ tablet Commonly known as: KLOR-CON M Take 1 tablet (20 mEq total) by mouth daily.   senna-docusate  8.6-50 MG tablet Commonly known as: Senokot-S Take 2 tablets by mouth 2 (two) times daily.   Torsemide 40 MG Tabs Take 40 mg by mouth daily. Start taking on: July 11, 2022        Follow-up Information     [facility]. Go in 10 day(s).   Specialty: Cardiology Why: Hospital follow  up PLEASE bring a current medication list to appointment FREE valet parking, Entrance C, off [employer] information: [address] [id number] Mylo Langeloth        Rudene Anda, MD. Schedule an appointment as soon as possible for a visit in 1 week(s).   Specialty: Internal Medicine Contact information: [address] [place] [address] [phone]                Allergies  Allergen Reactions   Iodinated Contrast Media Anaphylaxis   Penicillins Anaphylaxis    Patient tolerates cefepime (08/2017)   Insulin Lispro Other (See Comments)    Adder per [facility] from SNF   Minoxidil     Other reaction(s): hives     Other Procedures/Studies: DG Knee Right Port  Result Date: 07/04/2022 CLINICAL DATA:  Right knee injury yesterday, limited range of motion EXAM: PORTABLE RIGHT KNEE - 1-2 VIEW COMPARISON:  07/03/2021 FINDINGS: Frontal, bilateral oblique, and lateral views of the right knee are obtained. No acute fracture, subluxation, or dislocation. 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 No joint effusion. There is diffuse subcutaneous edema. IMPRESSION: 1. Stable severe 3 compartmental osteoarthritis. 2. No acute bony abnormality. Electronically Signed   By: Randa Ngo M.D.   On: 07/04/2022 18:58   DG Chest Port 1 View  Result Date: 07/04/2022 CLINICAL DATA:  Hypoxia, history of pneumonia, short of breath EXAM: PORTABLE CHEST 1 VIEW COMPARISON:  04/18/2022 FINDINGS: Single frontal view of the chest demonstrates an enlarged cardiac silhouette. There is  increased central vascular congestion with bilateral perihilar airspace disease. No large effusion or pneumothorax. No acute bony abnormalities. IMPRESSION: 1. Constellation of findings most consistent with congestive heart failure. Superimposed pneumonia would be difficult to exclude. Electronically Signed   By: Randa Ngo M.D.   On: 07/04/2022 18:57   ECHOCARDIOGRAM COMPLETE  Result Date: 06/12/2022    ECHOCARDIOGRAM REPORT   Patient Name:   TRUDEE CHIRINO Date of Exam: 06/12/2022 Medical Rec #:  [id number]            Height:       65.0 in Accession #:    [id number]           Weight:       379.0 lb Date of Birth:  May 03, 1966            BSA:          2.596 m Patient Age:    40 years             BP:           -/- mmHg Patient Gender: F                    HR:           48 bpm. Exam Location:  Outpatient Procedure: 2D Echo, Color Doppler and Cardiac Doppler Indications:    CHF  History:        Patient has prior history of Echocardiogram examinations, most                 recent 04/08/2022. CHF, COPD, Arrythmias:Atrial Fibrillation; Risk                 Factors:Dyslipidemia and Hypertension.  Sonographer:    Deliah Boston RDCS Referring Phys: [place]  1. Left ventricular ejection fraction, by estimation, is 60 to 65%. The left ventricle has normal function. The  left ventricle has no regional wall motion abnormalities. Left ventricular diastolic parameters were normal.  2. Right ventricular systolic function was not well visualized. The right ventricular size is mildly enlarged. There is normal pulmonary artery systolic pressure.  3. Left atrial size was severely dilated.  4. The mitral valve is abnormal. Trivial mitral valve regurgitation.  5. The aortic valve is tricuspid. There is moderate calcification of the aortic valve. There is moderate thickening of the aortic valve. Aortic valve regurgitation is mild. Moderate aortic valve stenosis. Aortic valve area, by VTI measures  1.14 cm. Aortic valve mean gradient measures 29.0 mmHg. Aortic valve Vmax measures 3.57 m/s. DI 0.34.  6. Aortic dilatation noted. There is borderline dilatation of the ascending aorta, measuring 38 mm.  7. The inferior vena cava is normal in size with greater than 50% respiratory variability, suggesting right atrial pressure of 3 mmHg. Comparison(s): Compared to prior TTE in 03/2022, the aortic valve gradient is less (mean gradient on current study is 35mHg). Previous mean gradient was 358mg. Otherwise, ther eis no significant change. FINDINGS  Left Ventricle: Left ventricular ejection fraction, by estimation, is 60 to 65%. The left ventricle has normal function. The left ventricle has no regional wall motion abnormalities. The left ventricular internal cavity size was normal in size. There is  no left ventricular hypertrophy. Left ventricular diastolic parameters were normal. Right Ventricle: The right ventricular size is mildly enlarged. No increase in right ventricular wall thickness. Right ventricular systolic function was not well visualized. There is normal pulmonary artery systolic pressure. The tricuspid regurgitant velocity is 1.82 m/s, and with an assumed right atrial pressure of 3 mmHg, the estimated right ventricular systolic pressure is 1641.9mHg. Left Atrium: Left atrial size was severely dilated. Right Atrium: Right atrial size was normal in size. Pericardium: There is no evidence of pericardial effusion. Mitral Valve: The mitral valve is abnormal. There is mild thickening of the mitral valve leaflet(s). There is mild calcification of the mitral valve leaflet(s). Mild to moderate mitral annular calcification. Trivial mitral valve regurgitation. Tricuspid Valve: The tricuspid valve is normal in structure. Tricuspid valve regurgitation is trivial. Aortic Valve: DI 0.34. The aortic valve is tricuspid. There is moderate calcification of the aortic valve. There is moderate thickening of the aortic  valve. Aortic valve regurgitation is mild. Aortic regurgitation PHT measures 795 msec. Moderate aortic stenosis is present. Aortic valve mean gradient measures 29.0 mmHg. Aortic valve peak gradient measures 51.0 mmHg. Aortic valve area, by VTI measures 1.14 cm. Pulmonic Valve: The pulmonic valve was normal in structure. Pulmonic valve regurgitation is trivial. Aorta: Aortic dilatation noted. There is borderline dilatation of the ascending aorta, measuring 38 mm. Venous: The inferior vena cava is normal in size with greater than 50% respiratory variability, suggesting right atrial pressure of 3 mmHg. IAS/Shunts: The atrial septum is grossly normal.  LEFT VENTRICLE PLAX 2D LVIDd:         5.10 cm   Diastology LVIDs:         3.10 cm   LV e' medial:    8.05 cm/s LV PW:         1.25 cm   LV E/e' medial:  15.3 LV IVS:        1.00 cm   LV e' lateral:   9.46 cm/s LVOT diam:     2.15 cm   LV E/e' lateral: 13.1 LV SV:         107 LV SV Index:   41 LVOT Area:  3.63 cm                           3D Volume EF:                          3D EF:        62 %                          LV EDV:       218 ml                          LV ESV:       82 ml                          LV SV:        136 ml RIGHT VENTRICLE RV Basal diam:  4.80 cm RV Mid diam:    3.20 cm RV S prime:     11.00 cm/s TAPSE (M-mode): 1.8 cm LEFT ATRIUM              Index        RIGHT ATRIUM           Index LA diam:        5.80 cm  2.23 cm/m   RA Area:     32.00 cm LA Vol (A2C):   173.0 ml 66.65 ml/m  RA Volume:   133.00 ml 51.24 ml/m LA Vol (A4C):   141.0 ml 54.32 ml/m LA Biplane Vol: 163.0 ml 62.80 ml/m  AORTIC VALVE AV Area (Vmax):    1.22 cm AV Area (Vmean):   1.14 cm AV Area (VTI):     1.14 cm AV Vmax:           357.00 cm/s AV Vmean:          247.500 cm/s AV VTI:            0.938 m AV Peak Grad:      51.0 mmHg AV Mean Grad:      29.0 mmHg LVOT Vmax:         119.50 cm/s LVOT Vmean:        77.950 cm/s LVOT VTI:          0.295 m LVOT/AV VTI ratio: 0.31 AI  PHT:            795 msec  AORTA Ao Root diam: 3.10 cm Ao Asc diam:  3.80 cm MITRAL VALVE                TRICUSPID VALVE MV Area (PHT): 3.14 cm     TV Peak grad:   14.1 mmHg MV Decel Time: 242 msec     TV Vmax:        1.88 m/s MV E velocity: 123.50 cm/s  TR Peak grad:   13.2 mmHg MV A velocity: 53.10 cm/s   TR Vmax:        182.00 cm/s MV E/A ratio:  2.33                             SHUNTS  Systemic VTI:  0.30 m                             Systemic Diam: 2.15 cm Gwyndolyn Kaufman MD Electronically signed by Gwyndolyn Kaufman MD Signature Date/Time: 06/12/2022/3:30:45 PM    Final      TODAY-DAY OF DISCHARGE:  Subjective:   Normajean Baxter today has no headache,no chest abdominal pain,no new weakness tingling or numbness, feels much better wants to go home today.   Objective:   Blood pressure 108/63, pulse 74, temperature 98.3 F (36.8 C), temperature source Oral, resp. rate 19, height '5\' 5"'$  (1.651 m), weight (!) 144.6 kg, SpO2 100 %.  Intake/Output Summary (Last 24 hours) at 07/10/2022 0910 Last data filed at 07/10/2022 0830 Gross per 24 hour  Intake 1937 ml  Output 3750 ml  Net -1813 ml   Filed Weights   07/08/22 0301 07/09/22 0616 07/10/22 0012  Weight: (!) 146.3 kg (!) 144.6 kg (!) 144.6 kg    Exam: Awake Alert, Oriented *3, No new F.N deficits, Normal affect [place].AT,PERRAL Supple Neck,No JVD, No cervical lymphadenopathy appriciated.  Symmetrical Chest wall movement, Good air movement bilaterally, CTAB RRR,No Gallops,Rubs or new Murmurs, No Parasternal Heave +ve B.Sounds, Abd Soft, Non tender, No organomegaly appriciated, No rebound -guarding or rigidity. No Cyanosis, Clubbing or edema, No new Rash or bruise   PERTINENT RADIOLOGIC STUDIES: No results found.   PERTINENT LAB RESULTS: CBC: No results for input(s): "WBC", "HGB", "HCT", "PLT" in the last 72 hours. CMET CMP     Component Value Date/Time   NA 139 07/10/2022 0142   K 3.7 07/10/2022  0142   CL 93 (L) 07/10/2022 0142   CO2 35 (H) 07/10/2022 0142   GLUCOSE 164 (H) 07/10/2022 0142   BUN 39 (H) 07/10/2022 0142   CREATININE 0.91 07/10/2022 0142   CALCIUM 8.6 (L) 07/10/2022 0142   PROT 7.3 07/04/2022 1848   ALBUMIN 3.3 (L) 07/04/2022 1848   AST 19 07/04/2022 1848   ALT 9 07/04/2022 1848   ALKPHOS 71 07/04/2022 1848   BILITOT 0.5 07/04/2022 1848   GFRNONAA >60 07/10/2022 0142   GFRAA >60 12/10/2017 0221    GFR Estimated Creatinine Clearance: 97.9 mL/min (by C-G formula based on SCr of 0.91 mg/dL). No results for input(s): "LIPASE", "AMYLASE" in the last 72 hours. No results for input(s): "CKTOTAL", "CKMB", "CKMBINDEX", "TROPONINI" in the last 72 hours. Invalid input(s): "POCBNP" No results for input(s): "DDIMER" in the last 72 hours. No results for input(s): "HGBA1C" in the last 72 hours. No results for input(s): "CHOL", "HDL", "LDLCALC", "TRIG", "CHOLHDL", "LDLDIRECT" in the last 72 hours. Recent Labs    07/10/22 0142  TSH 1.918   No results for input(s): "VITAMINB12", "FOLATE", "FERRITIN", "TIBC", "IRON", "RETICCTPCT" in the last 72 hours. Coags: No results for input(s): "INR" in the last 72 hours.  Invalid input(s): "PT" Microbiology: Recent Results (from the past 240 hour(s))  Resp Panel by RT-PCR (Flu A&B, Covid) Anterior Nasal Swab     Status: None   Collection Time: 07/04/22  5:44 PM   Specimen: Anterior Nasal Swab  Result Value Ref Range Status   SARS Coronavirus 2 by RT PCR NEGATIVE NEGATIVE Final    Comment: (NOTE) SARS-CoV-2 target nucleic acids are NOT DETECTED.  The SARS-CoV-2 RNA is generally detectable in upper respiratory specimens during the acute phase of infection. The lowest concentration of SARS-CoV-2 viral copies this assay can detect is 138 copies/mL. A  negative result does not preclude SARS-Cov-2 infection and should not be used as the sole basis for treatment or other patient management decisions. A negative result may occur  with  improper specimen collection/handling, submission of specimen other than nasopharyngeal swab, presence of viral mutation(s) within the areas targeted by this assay, and inadequate number of viral copies(<138 copies/mL). A negative result must be combined with clinical observations, patient history, and epidemiological information. The expected result is Negative.  Fact Sheet for Patients:  EntrepreneurPulse.com.au  Fact Sheet for Healthcare Providers:  IncredibleEmployment.be  This test is no t yet approved or cleared by the [place] FDA and  has been authorized for detection and/or diagnosis of SARS-CoV-2 by FDA under an Emergency Use Authorization (EUA). This EUA will remain  in effect (meaning this test can be used) for the duration of the COVID-19 declaration under Section 564(b)(1) of the Act, 21 U.S.C.section 360bbb-3(b)(1), unless the authorization is terminated  or revoked sooner.       Influenza A by PCR NEGATIVE NEGATIVE Final   Influenza B by PCR NEGATIVE NEGATIVE Final    Comment: (NOTE) The Xpert Xpress SARS-CoV-2/FLU/RSV plus assay is intended as an aid in the diagnosis of influenza from Nasopharyngeal swab specimens and should not be used as a sole basis for treatment. Nasal washings and aspirates are unacceptable for Xpert Xpress SARS-CoV-2/FLU/RSV testing.  Fact Sheet for Patients: EntrepreneurPulse.com.au  Fact Sheet for Healthcare Providers: IncredibleEmployment.be  This test is not yet approved or cleared by the [place] FDA and has been authorized for detection and/or diagnosis of SARS-CoV-2 by FDA under an Emergency Use Authorization (EUA). This EUA will remain in effect (meaning this test can be used) for the duration of the COVID-19 declaration under Section 564(b)(1) of the Act, 21 U.S.C. section 360bbb-3(b)(1), unless the authorization is terminated  or revoked.  Performed at [facility], [place] [address], [place], [place] [address]   Blood culture (routine x 2)     Status: Abnormal   Collection Time: 07/04/22 10:40 PM   Specimen: BLOOD  Result Value Ref Range Status   Specimen Description BLOOD BLOOD RIGHT ARM  Final   Special Requests   Final    BOTTLES DRAWN AEROBIC AND ANAEROBIC Blood Culture adequate volume   Culture  Setup Time   Final    GRAM POSITIVE COCCI IN CLUSTERS AEROBIC BOTTLE ONLY CRITICAL RESULT CALLED TO, READ BACK BY AND VERIFIED WITH:  C/ PHARMD E. GAGNE 07/06/22 0037 A. LAFRANCE    Culture (A)  Final    STAPHYLOCOCCUS EPIDERMIDIS THE SIGNIFICANCE OF ISOLATING THIS ORGANISM FROM A SINGLE SET OF BLOOD CULTURES WHEN MULTIPLE SETS ARE DRAWN IS UNCERTAIN. PLEASE NOTIFY THE MICROBIOLOGY DEPARTMENT WITHIN ONE WEEK IF SPECIATION AND SENSITIVITIES ARE REQUIRED. Performed at [facility], [place] [address] [place], [place] [address]    Report Status 07/07/2022 FINAL  Final  Blood Culture ID Panel (Reflexed)     Status: Abnormal   Collection Time: 07/04/22 10:40 PM  Result Value Ref Range Status   Enterococcus faecalis NOT DETECTED NOT DETECTED Final   Enterococcus Faecium NOT DETECTED NOT DETECTED Final   Listeria monocytogenes NOT DETECTED NOT DETECTED Final   Staphylococcus species DETECTED (A) NOT DETECTED Final    Comment: CRITICAL RESULT CALLED TO, READ BACK BY AND VERIFIED WITH:  C/ PHARMD E. GAGNE 07/06/22 0037 A. LAFRANCE    Staphylococcus aureus (BCID) NOT DETECTED NOT DETECTED Final   Staphylococcus epidermidis DETECTED (A) NOT DETECTED Final  Comment: Methicillin (oxacillin) resistant coagulase negative staphylococcus. Possible blood culture contaminant (unless isolated from more than one blood culture draw or clinical case suggests pathogenicity). No antibiotic treatment is indicated for blood  culture contaminants. CRITICAL RESULT CALLED TO, READ BACK BY AND VERIFIED WITH:  C/ PHARMD E.  GAGNE 07/06/22 0037 A. LAFRANCE    Staphylococcus lugdunensis NOT DETECTED NOT DETECTED Final   Streptococcus species NOT DETECTED NOT DETECTED Final   Streptococcus agalactiae NOT DETECTED NOT DETECTED Final   Streptococcus pneumoniae NOT DETECTED NOT DETECTED Final   Streptococcus pyogenes NOT DETECTED NOT DETECTED Final   A.calcoaceticus-baumannii NOT DETECTED NOT DETECTED Final   Bacteroides fragilis NOT DETECTED NOT DETECTED Final   Enterobacterales NOT DETECTED NOT DETECTED Final   Enterobacter cloacae complex NOT DETECTED NOT DETECTED Final   Escherichia coli NOT DETECTED NOT DETECTED Final   Klebsiella aerogenes NOT DETECTED NOT DETECTED Final   Klebsiella oxytoca NOT DETECTED NOT DETECTED Final   Klebsiella pneumoniae NOT DETECTED NOT DETECTED Final   Proteus species NOT DETECTED NOT DETECTED Final   Salmonella species NOT DETECTED NOT DETECTED Final   Serratia marcescens NOT DETECTED NOT DETECTED Final   Haemophilus influenzae NOT DETECTED NOT DETECTED Final   Neisseria meningitidis NOT DETECTED NOT DETECTED Final   Pseudomonas aeruginosa NOT DETECTED NOT DETECTED Final   Stenotrophomonas maltophilia NOT DETECTED NOT DETECTED Final   Candida albicans NOT DETECTED NOT DETECTED Final   Candida auris NOT DETECTED NOT DETECTED Final   Candida glabrata NOT DETECTED NOT DETECTED Final   Candida krusei NOT DETECTED NOT DETECTED Final   Candida parapsilosis NOT DETECTED NOT DETECTED Final   Candida tropicalis NOT DETECTED NOT DETECTED Final   Cryptococcus neoformans/gattii NOT DETECTED NOT DETECTED Final   Methicillin resistance mecA/C DETECTED (A) NOT DETECTED Final    Comment: CRITICAL RESULT CALLED TO, READ BACK BY AND VERIFIED WITH:  C/ PHARMD E. GAGNE 07/06/22 0037 A. LAFRANCE Performed at [facility], [place] [address] [place], [place] [address]   Blood culture (routine x 2)     Status: None (Preliminary result)   Collection Time: 07/04/22 11:21 PM   Specimen: BLOOD  RIGHT HAND  Result Value Ref Range Status   Specimen Description BLOOD RIGHT HAND  Final   Special Requests   Final    BOTTLES DRAWN AEROBIC AND ANAEROBIC Blood Culture adequate volume   Culture   Final    NO GROWTH 4 DAYS Performed at [facility], [place] [address], [place], [place] [address]    Report Status PENDING  Incomplete  MRSA Next Gen by PCR, Nasal     Status: None   Collection Time: 07/05/22 10:54 PM   Specimen: Nasal Mucosa; Nasal Swab  Result Value Ref Range Status   MRSA by PCR Next Gen NOT DETECTED NOT DETECTED Final    Comment: (NOTE) The GeneXpert MRSA Assay (FDA approved for NASAL specimens only), is one component of a comprehensive MRSA colonization surveillance program. It is not intended to diagnose MRSA infection nor to guide or monitor treatment for MRSA infections. Test performance is not FDA approved in patients less than 38 years old. Performed at [facility], [place] [address] [place], [place] [address]     FURTHER DISCHARGE INSTRUCTIONS:  Get Medicines reviewed and adjusted: Please take all your medications with you for your next visit with your Primary MD  Laboratory/radiological data: Please request your Primary MD to go over all hospital tests and procedure/radiological results at  the follow up, please ask your Primary MD to get all Hospital records sent to his/her office.  In some cases, they will be blood work, cultures and biopsy results pending at the time of your discharge. Please request that your primary care M.D. goes through all the records of your hospital data and follows up on these results.  Also Note the following: If you experience worsening of your admission symptoms, develop shortness of breath, life threatening emergency, suicidal or homicidal thoughts you must seek medical attention immediately by calling 911 or calling your MD immediately  if symptoms less severe.  You must read complete instructions/literature along  with all the possible adverse reactions/side effects for all the Medicines you take and that have been prescribed to you. Take any new Medicines after you have completely understood and accpet all the possible adverse reactions/side effects.   Do not drive when taking Pain medications or sleeping medications (Benzodaizepines)  Do not take more than prescribed Pain, Sleep and Anxiety Medications. It is not advisable to combine anxiety,sleep and pain medications without talking with your primary care practitioner  Special Instructions: If you have smoked or chewed Tobacco  in the last 2 yrs please stop smoking, stop any regular Alcohol  and or any Recreational drug use.  Wear Seat belts while driving.  Please note: You were cared for by a hospitalist during your hospital stay. Once you are discharged, your primary care physician will handle any further medical issues. Please note that NO REFILLS for any discharge medications will be authorized once you are discharged, as it is imperative that you return to your primary care physician (or establish a relationship with a primary care physician if you do not have one) for your post hospital discharge needs so that they can reassess your need for medications and monitor your lab values.  Total Time spent coordinating discharge including counseling, education and face to face time equals greater than 30 minutes.  Signed: Courtez Twaddle 07/10/2022 9:10 AM

## 2022-07-18 ENCOUNTER — Other Ambulatory Visit: Payer: Self-pay

## 2022-07-18 ENCOUNTER — Inpatient Hospital Stay (HOSPITAL_COMMUNITY)
Admission: EM | Admit: 2022-07-18 | Discharge: 2022-07-23 | DRG: 378 | Disposition: A | Discharge status: Skilled Nursing Facility | Payer: Medicare Other | Source: Skilled Nursing Facility | Attending: Student | Admitting: Student

## 2022-07-18 ENCOUNTER — Encounter (HOSPITAL_COMMUNITY): Payer: Medicare Other

## 2022-07-18 ENCOUNTER — Telehealth (HOSPITAL_COMMUNITY): Payer: Self-pay | Admitting: *Deleted

## 2022-07-18 ENCOUNTER — Encounter (HOSPITAL_COMMUNITY): Payer: Self-pay

## 2022-07-18 DIAGNOSIS — Z794 Long term (current) use of insulin: Secondary | ICD-10-CM

## 2022-07-18 DIAGNOSIS — Z888 Allergy status to other drugs, medicaments and biological substances status: Secondary | ICD-10-CM

## 2022-07-18 DIAGNOSIS — E039 Hypothyroidism, unspecified: Secondary | ICD-10-CM | POA: Diagnosis not present

## 2022-07-18 DIAGNOSIS — K922 Gastrointestinal hemorrhage, unspecified: Secondary | ICD-10-CM | POA: Diagnosis present

## 2022-07-18 DIAGNOSIS — I2510 Atherosclerotic heart disease of native coronary artery without angina pectoris: Secondary | ICD-10-CM | POA: Diagnosis present

## 2022-07-18 DIAGNOSIS — Z7401 Bed confinement status: Secondary | ICD-10-CM

## 2022-07-18 DIAGNOSIS — I110 Hypertensive heart disease with heart failure: Secondary | ICD-10-CM | POA: Diagnosis present

## 2022-07-18 DIAGNOSIS — D62 Acute posthemorrhagic anemia: Secondary | ICD-10-CM | POA: Diagnosis present

## 2022-07-18 DIAGNOSIS — E662 Morbid (severe) obesity with alveolar hypoventilation: Secondary | ICD-10-CM | POA: Diagnosis present

## 2022-07-18 DIAGNOSIS — G4733 Obstructive sleep apnea (adult) (pediatric): Secondary | ICD-10-CM | POA: Diagnosis present

## 2022-07-18 DIAGNOSIS — G5790 Unspecified mononeuropathy of unspecified lower limb: Secondary | ICD-10-CM | POA: Diagnosis present

## 2022-07-18 DIAGNOSIS — J4489 Other specified chronic obstructive pulmonary disease: Secondary | ICD-10-CM

## 2022-07-18 DIAGNOSIS — E785 Hyperlipidemia, unspecified: Secondary | ICD-10-CM | POA: Diagnosis present

## 2022-07-18 DIAGNOSIS — K921 Melena: Secondary | ICD-10-CM | POA: Diagnosis not present

## 2022-07-18 DIAGNOSIS — I350 Nonrheumatic aortic (valve) stenosis: Secondary | ICD-10-CM | POA: Diagnosis present

## 2022-07-18 DIAGNOSIS — L299 Pruritus, unspecified: Secondary | ICD-10-CM | POA: Diagnosis present

## 2022-07-18 DIAGNOSIS — J9611 Chronic respiratory failure with hypoxia: Secondary | ICD-10-CM | POA: Diagnosis present

## 2022-07-18 DIAGNOSIS — Z880 Allergy status to penicillin: Secondary | ICD-10-CM

## 2022-07-18 DIAGNOSIS — Z8249 Family history of ischemic heart disease and other diseases of the circulatory system: Secondary | ICD-10-CM

## 2022-07-18 DIAGNOSIS — K219 Gastro-esophageal reflux disease without esophagitis: Secondary | ICD-10-CM | POA: Diagnosis present

## 2022-07-18 DIAGNOSIS — I480 Paroxysmal atrial fibrillation: Secondary | ICD-10-CM | POA: Diagnosis present

## 2022-07-18 DIAGNOSIS — Z930 Tracheostomy status: Secondary | ICD-10-CM

## 2022-07-18 DIAGNOSIS — Z7982 Long term (current) use of aspirin: Secondary | ICD-10-CM

## 2022-07-18 DIAGNOSIS — M545 Low back pain, unspecified: Secondary | ICD-10-CM | POA: Diagnosis present

## 2022-07-18 DIAGNOSIS — K589 Irritable bowel syndrome without diarrhea: Secondary | ICD-10-CM | POA: Diagnosis present

## 2022-07-18 DIAGNOSIS — Z8541 Personal history of malignant neoplasm of cervix uteri: Secondary | ICD-10-CM

## 2022-07-18 DIAGNOSIS — J449 Chronic obstructive pulmonary disease, unspecified: Secondary | ICD-10-CM | POA: Diagnosis present

## 2022-07-18 DIAGNOSIS — Z6841 Body Mass Index (BMI) 40.0 and over, adult: Secondary | ICD-10-CM

## 2022-07-18 DIAGNOSIS — E873 Alkalosis: Secondary | ICD-10-CM | POA: Diagnosis present

## 2022-07-18 DIAGNOSIS — E1165 Type 2 diabetes mellitus with hyperglycemia: Secondary | ICD-10-CM

## 2022-07-18 DIAGNOSIS — B1920 Unspecified viral hepatitis C without hepatic coma: Secondary | ICD-10-CM | POA: Diagnosis present

## 2022-07-18 DIAGNOSIS — I10 Essential (primary) hypertension: Secondary | ICD-10-CM | POA: Diagnosis present

## 2022-07-18 DIAGNOSIS — D649 Anemia, unspecified: Secondary | ICD-10-CM

## 2022-07-18 DIAGNOSIS — Z91041 Radiographic dye allergy status: Secondary | ICD-10-CM

## 2022-07-18 DIAGNOSIS — Z7951 Long term (current) use of inhaled steroids: Secondary | ICD-10-CM

## 2022-07-18 DIAGNOSIS — Z7901 Long term (current) use of anticoagulants: Secondary | ICD-10-CM

## 2022-07-18 DIAGNOSIS — M1990 Unspecified osteoarthritis, unspecified site: Secondary | ICD-10-CM | POA: Diagnosis present

## 2022-07-18 DIAGNOSIS — G894 Chronic pain syndrome: Secondary | ICD-10-CM | POA: Diagnosis present

## 2022-07-18 DIAGNOSIS — Z8679 Personal history of other diseases of the circulatory system: Secondary | ICD-10-CM | POA: Diagnosis present

## 2022-07-18 DIAGNOSIS — I4892 Unspecified atrial flutter: Secondary | ICD-10-CM | POA: Diagnosis present

## 2022-07-18 DIAGNOSIS — Z79890 Hormone replacement therapy (postmenopausal): Secondary | ICD-10-CM

## 2022-07-18 DIAGNOSIS — Z79899 Other long term (current) drug therapy: Secondary | ICD-10-CM

## 2022-07-18 DIAGNOSIS — I5032 Chronic diastolic (congestive) heart failure: Secondary | ICD-10-CM | POA: Diagnosis not present

## 2022-07-18 DIAGNOSIS — K449 Diaphragmatic hernia without obstruction or gangrene: Secondary | ICD-10-CM | POA: Diagnosis present

## 2022-07-18 DIAGNOSIS — E1141 Type 2 diabetes mellitus with diabetic mononeuropathy: Secondary | ICD-10-CM | POA: Diagnosis present

## 2022-07-18 DIAGNOSIS — Z9981 Dependence on supplemental oxygen: Secondary | ICD-10-CM

## 2022-07-18 HISTORY — DX: Acute posthemorrhagic anemia: D62

## 2022-07-18 HISTORY — DX: Other specified chronic obstructive pulmonary disease: J44.89

## 2022-07-18 HISTORY — DX: Gastrointestinal hemorrhage, unspecified: K92.2

## 2022-07-18 LAB — COMPREHENSIVE METABOLIC PANEL
ALT: 10 U/L (ref 0–44)
AST: 14 U/L — ABNORMAL LOW (ref 15–41)
Albumin: 3.3 g/dL — ABNORMAL LOW (ref 3.5–5.0)
Alkaline Phosphatase: 70 U/L (ref 38–126)
Anion gap: 8 (ref 5–15)
BUN: 25 mg/dL — ABNORMAL HIGH (ref 6–20)
CO2: 38 mmol/L — ABNORMAL HIGH (ref 22–32)
Calcium: 8.4 mg/dL — ABNORMAL LOW (ref 8.9–10.3)
Chloride: 94 mmol/L — ABNORMAL LOW (ref 98–111)
Creatinine, Ser: 0.72 mg/dL (ref 0.44–1.00)
GFR, Estimated: >60 mL/min (ref >60)
Glucose, Bld: 191 mg/dL — ABNORMAL HIGH (ref 70–99)
Potassium: 3.8 mmol/L (ref 3.5–5.1)
Sodium: 140 mmol/L (ref 135–145)
Total Bilirubin: 0.4 mg/dL (ref 0.3–1.2)
Total Protein: 6.8 g/dL (ref 6.5–8.1)

## 2022-07-18 LAB — RETICULOCYTES
Immature Retic Fract: 27.3 % — ABNORMAL HIGH (ref 2.3–15.9)
RBC.: 2.59 MIL/uL — ABNORMAL LOW (ref 3.87–5.11)
Retic Count, Absolute: 169.1 10*3/uL (ref 19.0–186.0)
Retic Ct Pct: 6.5 % — ABNORMAL HIGH (ref 0.4–3.1)

## 2022-07-18 LAB — CBC
HCT: 23.6 % — ABNORMAL LOW (ref 36.0–46.0)
Hemoglobin: 6.7 g/dL — CL (ref 12.0–15.0)
MCH: 25.8 pg — ABNORMAL LOW (ref 26.0–34.0)
MCHC: 28.4 g/dL — ABNORMAL LOW (ref 30.0–36.0)
MCV: 90.8 fL (ref 80.0–100.0)
Platelets: 328 10*3/uL (ref 150–400)
RBC: 2.6 MIL/uL — ABNORMAL LOW (ref 3.87–5.11)
RDW: 17.7 % — ABNORMAL HIGH (ref 11.5–15.5)
WBC: 6.9 10*3/uL (ref 4.0–10.5)
nRBC: 0 % (ref 0.0–0.2)

## 2022-07-18 LAB — POC OCCULT BLOOD, ED: Fecal Occult Bld: POSITIVE — AB

## 2022-07-18 LAB — IRON AND TIBC
Iron: 41 ug/dL (ref 28–170)
Saturation Ratios: 10 % — ABNORMAL LOW (ref 10.4–31.8)
TIBC: 420 ug/dL (ref 250–450)
UIBC: 379 ug/dL

## 2022-07-18 LAB — VITAMIN B12: Vitamin B-12: 697 pg/mL (ref 180–914)

## 2022-07-18 LAB — FERRITIN: Ferritin: 9 ng/mL — ABNORMAL LOW (ref 11–307)

## 2022-07-18 LAB — FOLATE: Folate: 35.4 ng/mL (ref >5.9)

## 2022-07-18 LAB — GLUCOSE, CAPILLARY: Glucose-Capillary: 178 mg/dL — ABNORMAL HIGH (ref 70–99)

## 2022-07-18 LAB — PROTIME-INR
INR: 1.3 — ABNORMAL HIGH (ref 0.8–1.2)
Prothrombin Time: 16.2 seconds — ABNORMAL HIGH (ref 11.4–15.2)

## 2022-07-18 LAB — PREPARE RBC (CROSSMATCH)

## 2022-07-18 MED ORDER — TORSEMIDE 20 MG PO TABS
40.0000 mg | ORAL_TABLET | Freq: Every day | ORAL | Status: DC
  Administered 2022-07-19 – 2022-07-23 (×5): 40 mg via ORAL
  Filled 2022-07-18 (×5): qty 2

## 2022-07-18 MED ORDER — TRAMADOL HCL 50 MG PO TABS
50.0000 mg | ORAL_TABLET | Freq: Once | ORAL | Status: AC
  Administered 2022-07-19: 50 mg via ORAL
  Filled 2022-07-18: qty 1

## 2022-07-18 MED ORDER — ACETAMINOPHEN 325 MG PO TABS
650.0000 mg | ORAL_TABLET | Freq: Four times a day (QID) | ORAL | Status: DC | PRN
  Administered 2022-07-18 – 2022-07-22 (×2): 650 mg via ORAL
  Filled 2022-07-18 (×2): qty 2

## 2022-07-18 MED ORDER — LEVOTHYROXINE SODIUM 50 MCG PO TABS
150.0000 ug | ORAL_TABLET | Freq: Every day | ORAL | Status: DC
  Administered 2022-07-19 – 2022-07-23 (×5): 150 ug via ORAL
  Filled 2022-07-18 (×5): qty 1

## 2022-07-18 MED ORDER — PROCHLORPERAZINE EDISYLATE 10 MG/2ML IJ SOLN: 10.0000 mg | Freq: Four times a day (QID) | INTRAMUSCULAR | Status: DC | PRN

## 2022-07-18 MED ORDER — ALPRAZOLAM 0.5 MG PO TABS
0.5000 mg | ORAL_TABLET | Freq: Three times a day (TID) | ORAL | Status: DC | PRN
  Administered 2022-07-18 – 2022-07-22 (×3): 0.5 mg via ORAL
  Filled 2022-07-18 (×4): qty 1

## 2022-07-18 MED ORDER — PANTOPRAZOLE SODIUM 40 MG IV SOLR
40.0000 mg | Freq: Two times a day (BID) | INTRAVENOUS | Status: DC
  Administered 2022-07-18 – 2022-07-21 (×6): 40 mg via INTRAVENOUS
  Filled 2022-07-18 (×6): qty 10

## 2022-07-18 MED ORDER — GABAPENTIN 400 MG PO CAPS
400.0000 mg | ORAL_CAPSULE | Freq: Three times a day (TID) | ORAL | Status: DC
  Administered 2022-07-18 – 2022-07-23 (×15): 400 mg via ORAL
  Filled 2022-07-18 (×16): qty 1

## 2022-07-18 MED ORDER — ACETAMINOPHEN 650 MG RE SUPP: 650.0000 mg | Freq: Four times a day (QID) | RECTAL | Status: DC | PRN

## 2022-07-18 MED ORDER — HYDROCODONE-ACETAMINOPHEN 5-325 MG PO TABS: 1.0000 | ORAL_TABLET | Freq: Once | ORAL | Status: DC

## 2022-07-18 MED ORDER — SENNOSIDES-DOCUSATE SODIUM 8.6-50 MG PO TABS
1.0000 | ORAL_TABLET | Freq: Two times a day (BID) | ORAL | Status: DC
  Administered 2022-07-18 – 2022-07-20 (×2): 1 via ORAL
  Filled 2022-07-18 (×7): qty 1

## 2022-07-18 MED ORDER — BUDESONIDE 0.5 MG/2ML IN SUSP
0.5000 mg | Freq: Two times a day (BID) | RESPIRATORY_TRACT | Status: DC
  Administered 2022-07-18 – 2022-07-23 (×10): 0.5 mg via RESPIRATORY_TRACT
  Filled 2022-07-18 (×10): qty 2

## 2022-07-18 MED ORDER — SODIUM CHLORIDE 0.9 % IV BOLUS
1000.0000 mL | Freq: Once | INTRAVENOUS | Status: AC
  Administered 2022-07-18: 1000 mL via INTRAVENOUS

## 2022-07-18 MED ORDER — SODIUM CHLORIDE 0.9 % IV BOLUS
500.0000 mL | Freq: Once | INTRAVENOUS | Status: AC
  Administered 2022-07-18: 500 mL via INTRAVENOUS

## 2022-07-18 MED ORDER — ALBUTEROL SULFATE (2.5 MG/3ML) 0.083% IN NEBU: 2.5000 mg | INHALATION_SOLUTION | Freq: Four times a day (QID) | RESPIRATORY_TRACT | Status: DC | PRN

## 2022-07-18 MED ORDER — SODIUM CHLORIDE 0.9 % IV SOLN: 10.0000 mL/h | Freq: Once | INTRAVENOUS | Status: AC

## 2022-07-18 NOTE — ED Provider Notes (Addendum)
[facility] DEPT Provider Note   CSN: [id number] Arrival date & time: 07/18/22  1522     History  Chief Complaint  Patient presents with  . Abnormal Lab    Chelsea [name] is a 56 y.o. female with a medical history which includes obstructive sleep apnea, multifactorial heart failure with underlying OSA, paroxysmal A-fib on Eliquis, moderate aortic stenosis, type 2 diabetes, hypothyroidism, presenting from SNF with concern for low hemoglobin count.  Per paramedic report the patient had a hemoglobin "around 6" on testing at her facility.  The patient denies that she has had black or tarry stools.  She is significantly debilitated and minimally ambulatory, but denies any new or worsening shortness of breath.  She was recently hospitalized and discharged on 07/10/22 per review of medical records for acute on chronic hypoxic respiratory failure, also volume overloaded, requiring BiPAP on admission.  External records reviewed the patient was admitted for potential GI bleed in May 2020 to the [facility] system, requiring blood transfusion, 4 units in total given. Her labs were consistent with iron deficiency anemia.  She had a nuclear medicine GI bleed scan performed at that time which showed no evidence of active GI bleed.  She was felt to not be an optimal candidate for EGD therefore this was not performed.  Aspirin and Eliquis were held at that time and then resumed.  Most recent hgb in hospital here was checked on 07/07/22 and was 9.4.   HPI     Home Medications Prior to Admission medications   Medication Sig Start Date End Date Taking? Authorizing Provider  Acetaminophen 650 MG/20.3ML SOLN Place 650 mg into feeding tube every 6 (six) hours as needed for mild pain.    [provider]  albuterol (PROVENTIL) (2.5 MG/3ML) 0.083% nebulizer solution Take 3 mLs (2.5 mg total) by nebulization every 6 (six) hours as needed for wheezing or shortness of  breath (I50.32). 07/04/16   [name], MD  ALPRAZolam Duanne Moron) 0.5 MG tablet Take 1 tablet (0.5 mg total) by mouth every 8 (eight) hours as needed. 07/10/22   [name], [name], MD  aspirin 81 MG tablet Take 81 mg by mouth daily.    [provider]  budesonide (PULMICORT) 0.5 MG/2ML nebulizer solution Inhale 0.5 mg into the lungs every 12 (twelve) hours.    [provider]  Cholecalciferol 125 MCG (5000 UT) TABS Take 1 tablet (5,000 Units total) by mouth daily. 07/10/22   [name], [name], MD  cyanocobalamin (VITAMIN B12) 1000 MCG tablet Take 1 tablet (1,000 mcg total) by mouth daily. 07/10/22   [name], [name], MD  diclofenac Sodium (VOLTAREN) 1 % GEL Apply 4 g topically every 6 (six) hours as needed (pain).    [provider]  ELIQUIS 5 MG TABS tablet Take 5 mg by mouth 2 (two) times daily. 04/12/21   [provider]  folic acid (FOLVITE) 1 MG tablet Take 1 tablet (1 mg total) by mouth daily. 07/10/22   [name], [name], MD  gabapentin (NEURONTIN) 400 MG capsule Take 400 mg by mouth 3 (three) times daily.    [provider]  GLUCAGON HCL IJ Inject 1 mg into the skin daily as needed (Hypoglycemia).    [provider]  guaiFENesin (ROBITUSSIN) 100 MG/5ML liquid Take 10 mLs by mouth every 4 (four) hours as needed for cough or to loosen phlegm. 07/10/22   [name], [name], MD  insulin aspart (NOVOLOG) 100 UNIT/ML injection insulin  aspart (novoLOG) injection 0-24 Units 0-24 Units, Subcutaneous, 3 times daily with meals, CBG < 70: Implement Hypoglycemia measures, call MD CBG < 120 (dose in units): 0 CBG 120 - 160 (dose in units): 2 CBG 161 - 200 (dose in units): 4 CBG 201 - 250 (dose in units): 8 CBG 251 - 300 (dose in units): 12 CBG 301 - 350 (dose in units): 16 CBG 351 - 450 (dose in units): 20 CBG > 450: 24 units and obtain STAT glucose and notify MD 07/10/22   [name], MD  Iron-Folic Acid-Vit L79 (IRON FORMULA PO)  Take 1 tablet by mouth daily as needed (for iron levels.).    [provider]  levothyroxine (SYNTHROID) 150 MCG tablet Take 1 tablet (150 mcg total) by mouth daily at 6 (six) AM. 07/10/22   [name], [name], MD  Magnesium Hydroxide (MILK OF MAGNESIA PO) Take 1 tablet by mouth as needed (for milk of magnesia levels).    [provider]  Menthol, Topical Analgesic, (BIOFREEZE EX) Apply 1 application  topically as needed (fo pain).    [provider]  pantoprazole (PROTONIX) 40 MG tablet Take 1 tablet (40 mg total) by mouth 2 (two) times daily. 07/10/22   [name], [name], MD  polyethylene glycol (MIRALAX / GLYCOLAX) 17 g packet Take 17 g by mouth daily. 07/10/22   [name], [name], MD  potassium chloride SA (KLOR-CON M) 20 MEQ tablet Take 1 tablet (20 mEq total) by mouth daily. 07/10/22   [name], [name], MD  Probiotic Product (CULTURELLE PROBIOTICS PO) Take 1 tablet by mouth daily as needed (for probiotic levels).    [provider]  senna-docusate (SENOKOT-S) 8.6-50 MG tablet Take 2 tablets by mouth 2 (two) times daily. 07/10/22   [name], [name], MD  torsemide 40 MG TABS Take 40 mg by mouth daily. 07/11/22   [name], [name], MD      Allergies    Iodinated contrast media, Penicillins, Insulin lispro, and Minoxidil    Review of Systems   Review of Systems  Physical Exam Updated Vital Signs BP (!) 92/55   Pulse 79   Temp 97.7 F (36.5 C)   Resp 16   Ht '5\' 5"'$  (1.651 m)   Wt (!) 140.2 kg   SpO2 98%   BMI 51.42 kg/m  Physical Exam Constitutional:      General: She is not in acute distress.    Appearance: She is obese.  HENT:     Head: Normocephalic and atraumatic.  Eyes:     Conjunctiva/sclera: Conjunctivae normal.     Pupils: Pupils are equal, round, and reactive to light.  Cardiovascular:     Rate and Rhythm: Normal rate. Rhythm irregular.  Pulmonary:     Effort: Pulmonary effort is normal. No respiratory distress.      Comments: On chronic baseline oxygen Abdominal:     General: There is no distension.     Tenderness: There is no abdominal tenderness.  Skin:    General: Skin is warm and dry.  Neurological:     General: No focal deficit present.     Mental Status: She is alert. Mental status is at baseline.  Psychiatric:        Mood and Affect: Mood normal.        Behavior: Behavior normal.     ED Results / Procedures / Treatments   Labs (all labs ordered are listed, but only abnormal results are displayed) Labs Reviewed  COMPREHENSIVE METABOLIC PANEL - Abnormal; Notable for the following components:      Result Value   Chloride 94 (*)    CO2 38 (*)    Glucose, Bld 191 (*)    BUN 25 (*)    Calcium 8.4 (*)    Albumin 3.3 (*)    AST 14 (*)    All other components within normal limits  CBC - Abnormal; Notable for the following components:   RBC 2.60 (*)    Hemoglobin 6.7 (*)    HCT 23.6 (*)    MCH 25.8 (*)    MCHC 28.4 (*)    RDW 17.7 (*)    All other components within normal limits  IRON AND TIBC - Abnormal; Notable for the following components:   Saturation Ratios 10 (*)    All other components within normal limits  FERRITIN - Abnormal; Notable for the following components:   Ferritin 9 (*)    All other components within normal limits  RETICULOCYTES - Abnormal; Notable for the following components:   Retic Ct Pct 6.5 (*)    RBC. 2.59 (*)    Immature Retic Fract 27.3 (*)    All other components within normal limits  PROTIME-INR - Abnormal; Notable for the following components:   Prothrombin Time 16.2 (*)    INR 1.3 (*)    All other components within normal limits  POC OCCULT BLOOD, ED - Abnormal; Notable for the following components:   Fecal Occult Bld POSITIVE (*)    All other components within normal limits  VITAMIN B12  FOLATE  TYPE AND SCREEN  PREPARE RBC (CROSSMATCH)    EKG EKG Interpretation  Date/Time:  Wednesday July 18 2022 15:40:00 EDT Ventricular Rate:   77 PR Interval:  145 QRS Duration: 97 QT Interval:  402 QTC Calculation: 455 R Axis:   76 Text Interpretation: Sinus rhythm Borderline repolarization abnormality Confirmed by Octaviano Glow [id number]) on 07/18/2022 3:51:36 PM  Radiology No results found.  Procedures .Critical Care  Performed by: Wyvonnia Dusky, MD Authorized by: Wyvonnia Dusky, MD   Critical care provider statement:    Critical care time (minutes):  40   Critical care time was exclusive of:  Separately billable procedures and treating other patients   Critical care was necessary to treat or prevent imminent or life-threatening deterioration of the following conditions:  Circulatory failure   Critical care was time spent personally by me on the following activities:  Ordering and performing treatments and interventions, ordering and review of laboratory studies, ordering and review of radiographic studies, pulse oximetry, review of old charts, examination of patient and evaluation of patient's response to treatment   Care discussed with: admitting provider   Comments:     Symptomatic anemia and GI bleed, requiring blood transfusion     Medications Ordered in ED Medications  0.9 %  sodium chloride infusion (has no administration in time range)  sodium chloride 0.9 % bolus 1,000 mL (has no administration in time range)  sodium chloride 0.9 % bolus 500 mL (500 mLs Intravenous New Bag/Given 07/18/22 1609)    ED Course/ Medical Decision Making/ A&P Clinical Course as of 07/18/22 1803  Wed Jul 18, 2022  1658 Patient was verbally consented for blood transfusion, 2 units ordered.  Rectal exam performed with patient's nurse Rosemary Holms present as chaperone, dark stool. [MT]  6222 Patient is noted of chronically low blood pressure per my review of the records.  We can continue judicious fluid  bolus with her history of congestive heart failure, I think a 1.5 L would be reasonable, as well as a blood transfusion.  But  her map remains above 65 and she is mentating well and therefore I believe stable for hospitalization, not requiring vasopressors or intensive care [MT]  1803 Patient admitted to dr Olevia Bowens hospitalist. [MT]    Clinical Course User Index [MT] Wyvonnia Dusky, MD                           Medical Decision Making Amount and/or Complexity of Data Reviewed Labs: ordered.  Risk Prescription drug management. Decision regarding hospitalization.   This patient presents to the ED with concern for abnormal labs, potential acute anemia. This involves an extensive number of treatment options, and is a complaint that carries with it a high risk of complications and morbidity.  The differential diagnosis includes GI bleed versus multifactorial anemia versus other  Co-morbidities that complicate the patient evaluation: Multiple comorbidities including blood thinner use, high risk for GI bleed, obesity and limited mobility.  Additional history obtained from EMS  External records from outside source obtained and reviewed including recent hospitalization, [facility] records  I ordered and personally interpreted labs.  The pertinent results include: Hemoglobin 6.7 (down from 9.4 about 10 days ago in hospital, but stable from outpatient check yesterday).  CMP appears to baseline.  The patient does not have any new respiratory complaints to require chest x-ray at this time.  The patient was maintained on a cardiac monitor.  I personally viewed and interpreted the cardiac monitored which showed an underlying rhythm of: Regular heart rate  Per my interpretation the patient's ECG shows no acute ischemic findings  I ordered medication including fluid bolus for hypotension, which may be close to baseline but slightly lower, and volume resuscitation.  2 units packed red blood cell ordered  I have reviewed the patients home medicines and have made adjustments as needed  I requested consultation with the GI Dr  Watt Climes,  and discussed lab and imaging findings as well as pertinent plan - they recommend: [place] to consult on patient in hospital.  Given that she does not demonstrate signs of active hemorrhage, or hemorrhagic shock, and that her hemoglobin has been stable from yesterday to today without further drop, I do think she is reasonably stable for admission and inpatient consultation with specialist, rather than emergent ED consultation.  After the interventions noted above, I reevaluated the patient and found that they have: stayed the same  Dispostion:  After consideration of the diagnostic results and the patients response to treatment, I feel that the patent would benefit from medical admission.       Final Clinical Impression(s) / ED Diagnoses Final diagnoses:  Symptomatic anemia  Melena    Rx / DC Orders ED Discharge Orders     None         Zyheir Daft, Carola Rhine, MD 07/18/22 1749    Wyvonnia Dusky, MD 07/18/22 1750

## 2022-07-18 NOTE — H&P (Signed)
History and Physical    Patient: Chelsea [name] ZOX:[id number] DOB: Nov 19, 1965 DOA: 07/18/2022 DOS: the patient was seen and examined on 07/18/2022 PCP: Rudene Anda, MD  Patient coming from: Home  Chief Complaint:  Chief Complaint  Patient presents with   Abnormal Lab   HPI: Chelsea [name] is a 56 y.o. female with medical history significant of aortic stenosis osteoarthritis, asthma, COPD, tracheostomy, obesity hypoventilation syndrome, chronic respiratory failure with hypoxia, class III obesity with a BMI of 51.42 kg/m, CAD, chronic diastolic CHF, GERD, hepatitis C, gout, hyperlipidemia, hypertension, hypothyroidism, migraine headaches, history of multiple episodes of pneumonia, type 2 diabetes who was referred from her facility due to low hemoglobin level with the patient endorsing multiple episodes of melena over the past few weeks.  She has also been lightheaded and more tired than usual.  No fever, chills or night sweats. No sore throat, rhinorrhea, dyspnea, wheezing or hemoptysis.  No chest pain, palpitations, diaphoresis, PND, orthopnea or pitting edema of the lower extremities.  No appetite changes, abdominal pain, diarrhea, constipation or hematochezia.  No flank pain, dysuria, frequency or hematuria.  No polyuria, polydipsia, polyphagia or blurred vision.  ED course: Initial vital signs were temperature 97.7 F, pulse 77, respirations 17, BP 94/59 mmHg and O2 sat 99% on nasal cannula oxygen.  The patient received 1500 mL of NS bolus and 2 units of PRBC have been ordered.  [facility] was contacted by EDP.  Lab work: Her CBC is her white count 6.9, hemoglobin 6.7 g/dL platelet 328.  PT was 16.2 and INR 1.3.  Fecal occult blood was positive.  Anemia panel showed an increased reticulocyte count of 6.5% with a low ferritin at 9 ng/mL and low saturation ratio.  CMP showed a chloride of 94 and CO2 of 38 mmol/L.  Anion gap was normal.  The rest of the electrolytes were normal after  calcium correction.  Glucose 191, BUN 25 and creatinine 0.72 mg/dL.  LFTs show an albumin of 3.3 g/dL and AST of 14 units/L, the rest of the LFTs were unremarkable.   Review of Systems: As mentioned in the history of present illness. All other systems reviewed and are negative. Past Medical History:  Diagnosis Date   Aortic stenosis    Echocardiogram 02/21/11:  Mean gradient 23 mm of mercury; peak gradient 36;; Turbulence noted in the area of the aortic isthmus with peak gradient 23 mmHg-consider MRI to assess for coarctation;    TEE: 04/02/11:  EF 55-60%, mod BAE, trileaflet AV with mild AS (pk and mean 25 and 16), mild AI, mild MR, mild TR, atrial septal aneurysm, no evidence of corarctation   Arthritis    "qwhere" (12/05/2017)   Asthma    Cervical cancer ([place]) 2006   CHF (congestive heart failure) (HCC)    Chronic lower back pain    Complication of anesthesia    "I have a hard time waking up from under it" (12/05/2017)   COPD (chronic obstructive pulmonary disease) ([place])    "lung dr said I don't have this" (12/05/2017)   Coronary artery disease    Diastolic congestive heart failure ([place])    Echo 02/21/11: EF 55-60%, moderate LVH, grade 2 diastolic dysfunction, mild to moderate aortic stenosis with a mean gradient 23 mm of mercury   GERD (gastroesophageal reflux disease)    Heart murmur    Hepatitis C    History of gout X 1   Hyperlipidemia    Hypertension    Hypotension arterial 04/07/2022  Hypothyroid    IBS (irritable bowel syndrome)    Migraine    "monthly" (12/05/2017)   Obesity hypoventilation syndrome ([place])    On home oxygen therapy    "2L all the time" (12/05/2017)   OSA treated with BiPAP    "have CPAP at home too; wearing BiPAP right now" (12/05/2017)   Pneumonia    "several times" (12/05/2017)   Type II diabetes mellitus ([place])    Past Surgical History:  Procedure Laterality Date   ABDOMINAL SURGERY  2006 X 2   "TAH incision was infected"   CHOLECYSTECTOMY OPEN     TONSILLECTOMY      TOTAL ABDOMINAL HYSTERECTOMY  2006   TRACHEOSTOMY REVISION N/A 04/09/2022   Procedure: CONTROL OF BLEEDING,TRACHEOSTOMY SITE;  Surgeon: Melida Quitter, MD;  Location: [facility];  Service: ENT;  Laterality: N/A;   Social History:  reports that she has never smoked. She has never used smokeless tobacco. She reports that she does not currently use alcohol. She reports that she does not use drugs.  Allergies  Allergen Reactions   Iodinated Contrast Media Anaphylaxis   Penicillins Anaphylaxis    Patient tolerates cefepime (08/2017)   Insulin Lispro Other (See Comments)    Adder per MAR from SNF   Minoxidil     Other reaction(s): hives    Family History  Problem Relation Age of Onset   Heart disease Father     Prior to Admission medications   Medication Sig Start Date End Date Taking? Authorizing Provider  acetaminophen (TYLENOL) 325 MG tablet Take 650 mg by mouth every 6 (six) hours as needed for mild pain.   Yes [provider]  albuterol (PROVENTIL) (2.5 MG/3ML) 0.083% nebulizer solution Take 3 mLs (2.5 mg total) by nebulization every 6 (six) hours as needed for wheezing or shortness of breath (I50.32). 07/04/16  Yes [name], MD  ALPRAZolam Duanne Moron) 0.5 MG tablet Take 1 tablet (0.5 mg total) by mouth every 8 (eight) hours as needed. Patient taking differently: Take 0.5 mg by mouth every 8 (eight) hours as needed for anxiety. 07/10/22  Yes [name], [name], MD  aspirin 81 MG tablet Take 81 mg by mouth daily.   Yes [provider]  budesonide (PULMICORT) 0.5 MG/2ML nebulizer solution Inhale 0.5 mg into the lungs every 12 (twelve) hours.   Yes [provider]  cyanocobalamin (VITAMIN B12) 1000 MCG tablet Take 1 tablet (1,000 mcg total) by mouth daily. 07/10/22  Yes [name], [name], MD  diclofenac Sodium (VOLTAREN) 1 % GEL Apply 4 g topically every 6 (six) hours as needed (pain).   Yes [provider]  ELIQUIS 5 MG TABS tablet Take 5 mg by mouth 2  (two) times daily. 04/12/21  Yes [provider]  ferrous sulfate 324 MG TBEC Take 324 mg by mouth daily.   Yes [provider]  folic acid (FOLVITE) 1 MG tablet Take 1 tablet (1 mg total) by mouth daily. 07/10/22  Yes [name], [name], MD  gabapentin (NEURONTIN) 400 MG capsule Take 400 mg by mouth 3 (three) times daily.   Yes [provider]  GLUCAGON HCL IJ Inject 1 mg into the skin daily as needed (Hypoglycemia).   Yes [provider]  insulin aspart (NOVOLOG) 100 UNIT/ML injection insulin aspart (novoLOG) injection 0-24 Units 0-24 Units, Subcutaneous, 3 times daily with meals, CBG < 70: Implement Hypoglycemia measures, call MD CBG < 120 (dose in units): 0 CBG 120 - 160 (dose in units): 2  CBG 161 - 200 (dose in units): 4 CBG 201 - 250 (dose in units): 8 CBG 251 - 300 (dose in units): 12 CBG 301 - 350 (dose in units): 16 CBG 351 - 450 (dose in units): 20 CBG > 450: 24 units and obtain STAT glucose and notify MD 07/10/22  Yes [name], [name], MD  levothyroxine (SYNTHROID) 150 MCG tablet Take 1 tablet (150 mcg total) by mouth daily at 6 (six) AM. 07/10/22  Yes [name], [name], MD  Menthol, Topical Analgesic, (BIOFREEZE EX) Apply 1 application  topically as needed (fo pain).   Yes [provider]  pantoprazole (PROTONIX) 40 MG tablet Take 1 tablet (40 mg total) by mouth 2 (two) times daily. 07/10/22  Yes [name], [name], MD  polyethylene glycol (MIRALAX / GLYCOLAX) 17 g packet Take 17 g by mouth daily. 07/10/22  Yes [name], [name], MD  potassium chloride SA (KLOR-CON M) 20 MEQ tablet Take 1 tablet (20 mEq total) by mouth daily. 07/10/22  Yes [name], [name], MD  senna-docusate (SENOKOT-S) 8.6-50 MG tablet Take 2 tablets by mouth 2 (two) times daily. Patient taking differently: Take 1 tablet by mouth 2 (two) times daily. 07/10/22  Yes [name], [name], MD  torsemide (DEMADEX) 20 MG tablet Take 40 mg by mouth daily.   Yes [provider]  Cholecalciferol 125 MCG (5000 UT) TABS Take 1 tablet (5,000 Units total) by mouth daily. Patient not taking: Reported on 07/18/2022 07/10/22   [name], MD  torsemide 40 MG TABS Take 40 mg by mouth daily. Patient not taking: Reported on 07/18/2022 07/11/22   [name], MD    Physical Exam: Vitals:   07/18/22 1540 07/18/22 1630 07/18/22 1700 07/18/22 1730  BP: (!) 94/59 104/70 (!) 88/59 (!) 92/55  Pulse: 77 75 77 79  Resp: '17 16 18 16  '$ Temp: 97.7 F (36.5 C)     SpO2: 99% 100% 96% 98%  Weight:      Height:       Physical Exam Vitals and nursing note reviewed.  Constitutional:      General: She is awake. She is not in acute distress.    Appearance: Normal appearance.  HENT:     Head: Normocephalic.     Nose: No rhinorrhea.     Mouth/Throat:     Mouth: Mucous membranes are moist.  Eyes:     General: No scleral icterus.    Pupils: Pupils are equal, round, and reactive to light.  Neck:     Vascular: No JVD.  Cardiovascular:     Rate and Rhythm: Normal rate and regular rhythm.     Heart sounds: S1 normal and S2 normal.  Pulmonary:     Effort: Pulmonary effort is normal.     Breath sounds: Normal breath sounds.  Abdominal:     General: Bowel sounds are normal. There is no distension.     Palpations: Abdomen is soft.     Tenderness: There is no abdominal tenderness. There is no right CVA tenderness, left CVA tenderness or guarding.  Musculoskeletal:     Cervical back: Neck supple.     Right lower leg: No edema.     Left lower leg: No edema.  Skin:    General: Skin is warm and dry.  Neurological:     General: No focal deficit present.     Mental Status: She is alert and oriented to person, place, and time.  Psychiatric:  Mood and Affect: Mood normal.        Behavior: Behavior normal. Behavior is cooperative.   Data Reviewed:  There are no new results to review at this time.  Assessment and Plan: Principal Problem:   GI  bleed With associated:   Acute blood loss anemia Admit to progressive care/inpatient. Keep NPO for now. Continue IV fluids boluses. Transfuse 2 unit of PRBC. Monitor H&H. Further transfusion as needed. Continue pantoprazole 40 mg IVP twice daily. GI consult has been requested.  Active Problems:   Obstructive sleep apnea Continue BiPAP/CPAP at bedtime.    Hypothyroidism Continue levothyroxine 150 mcg p.o. daily.    Chronic diastolic heart failure (HCC) No signs of decompensation.   Continue torsemide 40 mg p.o. daily.    Obesity hypoventilation syndrome (HCC) Continue weight loss. Avoid sedating medications. Continue CPAP or BiPAP at bedtime.    Atrial flutter (HCC)   Paroxysmal atrial fibrillation (HCC) CHA2DS2-VASc Score of at least 3 Apixaban has been held.    Mononeuropathy of lower extremity Continue gabapentin 400 mg p.o. twice daily.    Gastro-esophageal reflux disease without esophagitis On parenteral PPI.    Essential (primary) hypertension Blood pressures are soft. Monitor closely. Hold torsemide until tomorrow/posttransfusion.    Class 3 severe obesity with serious comorbidity  and body mass index (BMI) of 50.0 to 59.9 in adult [facility]) Current BMI 51.42 kg/m. Continue lifestyle modifications.     Advance Care Planning:   Code Status: Full code.  Consults: [facility] (Dr. Alessandra Bevels).  Family Communication:   Severity of Illness: The appropriate patient status for this patient is OBSERVATION. Observation status is judged to be reasonable and necessary in order to provide the required intensity of service to ensure the patient's safety. The patient's presenting symptoms, physical exam findings, and initial radiographic and laboratory data in the context of their medical condition is felt to place them at decreased risk for further clinical deterioration. Furthermore, it is anticipated that the patient will be medically stable for discharge from the  hospital within 2 midnights of admission.   Author: Reubin Milan, MD 07/18/2022 6:38 PM  For on call review [email or web address].   This document was prepared using Dragon voice recognition software and may contain some unintended transcription errors.

## 2022-07-18 NOTE — ED Triage Notes (Signed)
Pt bib EMS from [place] c/o Low Hgb 6.? From labs drawn this AM. Per pt, she had trach placed few months ago and was taken out about 2 months ago and has been having problems with her Hgb since. "Tests" were done but unable to detect cause. Reports chronic pain, no SOB, no chest pain.  SBP 98 HR 80 SPO2 100% 5LNC at baseline

## 2022-07-18 NOTE — Telephone Encounter (Signed)
Call attempted to confirm HV TOC appt 2 pm on 07/18/22. HIPPA appropriate VM left with callback number.    Earnestine Leys, BSN, Clinical cytogeneticist Only

## 2022-07-19 ENCOUNTER — Observation Stay (HOSPITAL_COMMUNITY): Payer: Medicare Other

## 2022-07-19 DIAGNOSIS — K922 Gastrointestinal hemorrhage, unspecified: Secondary | ICD-10-CM | POA: Diagnosis not present

## 2022-07-19 LAB — COMPREHENSIVE METABOLIC PANEL
ALT: 10 U/L (ref 0–44)
AST: 14 U/L — ABNORMAL LOW (ref 15–41)
Albumin: 3.3 g/dL — ABNORMAL LOW (ref 3.5–5.0)
Alkaline Phosphatase: 79 U/L (ref 38–126)
Anion gap: 8 (ref 5–15)
BUN: 21 mg/dL — ABNORMAL HIGH (ref 6–20)
CO2: 38 mmol/L — ABNORMAL HIGH (ref 22–32)
Calcium: 8.6 mg/dL — ABNORMAL LOW (ref 8.9–10.3)
Chloride: 96 mmol/L — ABNORMAL LOW (ref 98–111)
Creatinine, Ser: 0.59 mg/dL (ref 0.44–1.00)
GFR, Estimated: >60 mL/min (ref >60)
Glucose, Bld: 140 mg/dL — ABNORMAL HIGH (ref 70–99)
Potassium: 3.8 mmol/L (ref 3.5–5.1)
Sodium: 142 mmol/L (ref 135–145)
Total Bilirubin: 0.7 mg/dL (ref 0.3–1.2)
Total Protein: 7.2 g/dL (ref 6.5–8.1)

## 2022-07-19 LAB — CBC
HCT: 31 % — ABNORMAL LOW (ref 36.0–46.0)
Hemoglobin: 9.1 g/dL — ABNORMAL LOW (ref 12.0–15.0)
MCH: 26.7 pg (ref 26.0–34.0)
MCHC: 29.4 g/dL — ABNORMAL LOW (ref 30.0–36.0)
MCV: 90.9 fL (ref 80.0–100.0)
Platelets: 257 10*3/uL (ref 150–400)
RBC: 3.41 MIL/uL — ABNORMAL LOW (ref 3.87–5.11)
RDW: 17.2 % — ABNORMAL HIGH (ref 11.5–15.5)
WBC: 5.9 10*3/uL (ref 4.0–10.5)
nRBC: 0 % (ref 0.0–0.2)

## 2022-07-19 LAB — GLUCOSE, CAPILLARY
Glucose-Capillary: 130 mg/dL — ABNORMAL HIGH (ref 70–99)
Glucose-Capillary: 121 mg/dL — ABNORMAL HIGH (ref 70–99)

## 2022-07-19 MED ORDER — POTASSIUM CHLORIDE CRYS ER 20 MEQ PO TBCR
20.0000 meq | EXTENDED_RELEASE_TABLET | Freq: Every day | ORAL | Status: DC
  Administered 2022-07-19 – 2022-07-23 (×5): 20 meq via ORAL
  Filled 2022-07-19 (×5): qty 1

## 2022-07-19 MED ORDER — INSULIN ASPART 100 UNIT/ML IJ SOLN
0.0000 [IU] | Freq: Three times a day (TID) | INTRAMUSCULAR | Status: DC
  Administered 2022-07-19 – 2022-07-20 (×4): 2 [IU] via SUBCUTANEOUS
  Administered 2022-07-20: 3 [IU] via SUBCUTANEOUS
  Administered 2022-07-21 (×2): 2 [IU] via SUBCUTANEOUS
  Administered 2022-07-21: 3 [IU] via SUBCUTANEOUS
  Administered 2022-07-22: 5 [IU] via SUBCUTANEOUS
  Administered 2022-07-23: 2 [IU] via SUBCUTANEOUS
  Administered 2022-07-23: 3 [IU] via SUBCUTANEOUS

## 2022-07-19 MED ORDER — GUAIFENESIN ER 600 MG PO TB12
600.0000 mg | ORAL_TABLET | Freq: Two times a day (BID) | ORAL | Status: DC
  Administered 2022-07-19 – 2022-07-23 (×9): 600 mg via ORAL
  Filled 2022-07-19 (×9): qty 1

## 2022-07-19 MED ORDER — DICLOFENAC SODIUM 1 % EX GEL
4.0000 g | Freq: Four times a day (QID) | CUTANEOUS | Status: DC | PRN
  Filled 2022-07-19: qty 100

## 2022-07-19 MED ORDER — OXYCODONE HCL 5 MG PO TABS
10.0000 mg | ORAL_TABLET | Freq: Four times a day (QID) | ORAL | Status: DC | PRN
  Administered 2022-07-19 – 2022-07-23 (×10): 10 mg via ORAL
  Filled 2022-07-19 (×10): qty 2

## 2022-07-19 NOTE — Progress Notes (Signed)
Chaplain received a consult that patient would like to create or update their advance directives.  The patient shared that she has already completed this and chaplain confirmed that she has documents on file from June 2023.  If there are further needs, please page or consult [place].  [address], [place] Pager, [phone]

## 2022-07-19 NOTE — Progress Notes (Signed)
Patient coughed up a small amount of phlegm thi shift- phlegm is pink tinged with green/yellow streaks. Pt coughing up in a cup "in case doctor wants to see." MD Adhikari made aware, CXR and Mucinex ordered. O2 Sats stable on 4L/[place]. Will continue to monitor.

## 2022-07-19 NOTE — Progress Notes (Signed)
PROGRESS NOTE  Chelsea [name]  NFA:[id number] DOB: 1966-04-14 DOA: 07/18/2022 PCP: Rudene Anda, MD   Brief Narrative:  Patient is a 56 year old female with history of aortic stenosis, osteoarthritis, asthma, COPD, tracheostomy, obesity hypoventilation syndrome, chronic respiratory failure with hypoxia, morbid obesity, coronary disease, chronic diastolic CHF, GERD, hepatitis C, hyperlipidemia, hypertension, hypothyroidism, type 2 diabetes who presents from [facility] health care for for the evaluation of low hemoglobin , melena.  Patient was also lightheaded, more tired than usual. On presentation, she was mildly hypotensive.  Hemoglobin was in the range of 6 on presentation.  Given 2 units of blood transfusion.  GI consulted.  Assessment & Plan:  Principal Problem:   GI bleed Active Problems:   Obstructive sleep apnea   Hypothyroidism   Chronic diastolic heart failure (HCC)   Obesity hypoventilation syndrome (HCC)   Atrial flutter (HCC)   Mononeuropathy of lower extremity   Gastro-esophageal reflux disease without esophagitis   Essential (primary) hypertension   Class 3 severe obesity with serious comorbidity and body mass index (BMI) of 50.0 to 59.9 in adult [facility])   Paroxysmal atrial fibrillation (HCC)   Acute blood loss anemia   Acute blood loss anemia/upper GI bleed: Presented with weakness, melena, fatigue.  Hemoglobin in the range of 6 on presentation.  Transfused 2 L of PRBC.  New hemoglobin pending.  GI consulted.  Currently on Protonix 40 mg IV twice.  Morbid obesity /obesity hypoventilation syndrome/ OSA/chronic hypoxic respiratory failure: On BiPAP/CPAP at bedtime.  BMI 53.3.  On 4 L of oxygen at baseline.  Hypothyroidism: On levothyroxine  Chronic diastolic CHF: Currently euvolemic.  Continue torsemide 40 mg twice daily.  Continue potassium supplementation  Paroxysmal A-fib: Currently in normal sinus rhythm.  Eliquis on hold due to GI bleed  Neuropathy of lower  extremities/chronic pain syndrome: Continue gabapentin.  Complains of pain everywhere.  Has history of chronic pain syndrome and takes hydrocodone  History of GERD: Continue PPI  History of hypertension: Blood pressure remains soft .  Cud be at baseline  Debility: Has been residing at nursing facility.  Unable to walk due to neuropathy.  Bedbound mostly.  DC following.  Will check if she needs PT/OT before dc       DVT prophylaxis:SCDs Start: 07/18/22 1839     Code Status: Full Code  Family Communication: None at bedside  Patient status:Observation  Patient is from :SNF  Anticipated discharge to:SNF  Estimated DC date:1- days,waiting for GI workup   Consultants: GI  Procedures:None yet  Antimicrobials:  Anti-infectives (From admission, onward)    None       Subjective:  Patient seen and examined at bedside today.  Lying in bed.  Appears comfortable.  Alert and oriented.  Denies any abdomen pain, nausea or vomiting.  No report of bloody or black bowel movement since admission.  No complaint of shortness of breath  Objective: Vitals:   07/19/22 0208 07/19/22 0430 07/19/22 0830 07/19/22 0852  BP: 115/63 127/89  98/63  Pulse: 84 76  81  Resp: '20 19  18  '$ Temp: 98.9 F (37.2 C) 97.9 F (36.6 C)  97.6 F (36.4 C)  TempSrc: Axillary Axillary  Oral  SpO2: 91% 99% 99% 98%  Weight:      Height:        Intake/Output Summary (Last 24 hours) at 07/19/2022 0934 Last data filed at 07/19/2022 0600 Gross per 24 hour  Intake 1103 ml  Output 700 ml  Net 403 ml   Filed  Weights   07/18/22 1530 07/18/22 2100  Weight: (!) 140.2 kg (!) 145.5 kg    Examination:  General exam: Overall comfortable, not in distress, morbidly obese HEENT: PERRL Respiratory system:  no wheezes or crackles  Cardiovascular system: S1 & S2 heard, RRR.  Pansystolic murmur Gastrointestinal system: Abdomen is nondistended, soft and nontender. Central nervous system: Alert and  oriented Extremities: Bilateral lower to lymphedema , no clubbing ,no cyanosis Skin: No rashes, no ulcers,no icterus     Data Reviewed: I have personally reviewed following labs and imaging studies  CBC: Recent Labs  Lab 07/18/22 1547  WBC 6.9  HGB 6.7*  HCT 23.6*  MCV 90.8  PLT 329   Basic Metabolic Panel: Recent Labs  Lab 07/18/22 1547  NA 140  K 3.8  CL 94*  CO2 38*  GLUCOSE 191*  BUN 25*  CREATININE 0.72  CALCIUM 8.4*     No results found for this or any previous visit (from the past 240 hour(s)).   Radiology Studies: No results found.  Scheduled Meds:  budesonide  0.5 mg Inhalation Q12H   gabapentin  400 mg Oral TID   levothyroxine  150 mcg Oral Q0600   pantoprazole (PROTONIX) IV  40 mg Intravenous Q12H   potassium chloride  20 mEq Oral Daily   senna-docusate  1 tablet Oral BID   torsemide  40 mg Oral Daily   Continuous Infusions:  sodium chloride       LOS: 0 days   Shelly Coss, MD [facility] P10/19/2023, 9:34 AM

## 2022-07-19 NOTE — Consult Note (Signed)
Referring Provider: [facility] Primary Care Physician:  Rudene Anda, MD Primary Gastroenterologist:  Althia Forts  Reason for Consultation:  anemia, melena  HPI: Chelsea [name] is a 56 y.o. female past medical history of aortic stenosis, arthritis, asthma, CHF, COPD on 4 L nasal cannula at home, GERD, hepatitis C, hypertension, hypothyroidism, type 2 diabetes, morbid obesity who presented from SNF facility to [facility] long ED on 07/18/2022 for anemia on lab work.  Patient reports no GI symptoms today.  Denies nausea, vomiting, constipation, diarrhea, abdominal pain, melena, hematochezia.  Patient hemoglobin around 6 at her SNF.  Patient previously admitted having melena for a few weeks but in conversation today denies melena.  Denies increasing weakness or fatigue.  Denies fevers, chills, night sweats.  Denies changes in appetite.  In the ED, dated at 25, hemoglobin 6.7, no leukocytosis, ferritin 9, PT 16.2, INR 1.3, FOBT positive, patient discussed with Dr. Watt Climes had patient was decided for admission for possible GI bleed.  Last dose of Eliquis yesterday morning.  Denies NSAID use.   No previous colonoscopy.  Patient reports previous EGD but no records on file on Care Everywhere.  Notes the EGD happened when she was having work on her tracheostomy site done.  Past Medical History:  Diagnosis Date   Aortic stenosis    Echocardiogram 02/21/11:  Mean gradient 23 mm of mercury; peak gradient 36;; Turbulence noted in the area of the aortic isthmus with peak gradient 23 mmHg-consider MRI to assess for coarctation;    TEE: 04/02/11:  EF 55-60%, mod BAE, trileaflet AV with mild AS (pk and mean 25 and 16), mild AI, mild MR, mild TR, atrial septal aneurysm, no evidence of corarctation   Arthritis    "qwhere" (12/05/2017)   Asthma    Cervical cancer ([place]) 2006   CHF (congestive heart failure) (HCC)    Chronic lower back pain    Complication of anesthesia    "I have a hard time waking up from under it"  (12/05/2017)   COPD (chronic obstructive pulmonary disease) ([place])    "lung dr said I don't have this" (12/05/2017)   Coronary artery disease    Diastolic congestive heart failure ([place])    Echo 02/21/11: EF 55-60%, moderate LVH, grade 2 diastolic dysfunction, mild to moderate aortic stenosis with a mean gradient 23 mm of mercury   GERD (gastroesophageal reflux disease)    Heart murmur    Hepatitis C    History of gout X 1   Hyperlipidemia    Hypertension    Hypotension arterial 04/07/2022   Hypothyroid    IBS (irritable bowel syndrome)    Migraine    "monthly" (12/05/2017)   Obesity hypoventilation syndrome ([place])    On home oxygen therapy    "2L all the time" (12/05/2017)   OSA treated with BiPAP    "have CPAP at home too; wearing BiPAP right now" (12/05/2017)   Pneumonia    "several times" (12/05/2017)   Type II diabetes mellitus ([place])     Past Surgical History:  Procedure Laterality Date   ABDOMINAL SURGERY  2006 X 2   "TAH incision was infected"   CHOLECYSTECTOMY OPEN     TONSILLECTOMY     TOTAL ABDOMINAL HYSTERECTOMY  2006   TRACHEOSTOMY REVISION N/A 04/09/2022   Procedure: CONTROL OF BLEEDING,TRACHEOSTOMY SITE;  Surgeon: Melida Quitter, MD;  Location: [facility];  Service: ENT;  Laterality: N/A;    Prior to Admission medications   Medication Sig Start Date End Date Taking? Authorizing  Provider  acetaminophen (TYLENOL) 325 MG tablet Take 650 mg by mouth every 6 (six) hours as needed for mild pain.   Yes [provider]  albuterol (PROVENTIL) (2.5 MG/3ML) 0.083% nebulizer solution Take 3 mLs (2.5 mg total) by nebulization every 6 (six) hours as needed for wheezing or shortness of breath (I50.32). 07/04/16  Yes [name], MD  ALPRAZolam Duanne Moron) 0.5 MG tablet Take 1 tablet (0.5 mg total) by mouth every 8 (eight) hours as needed. Patient taking differently: Take 0.5 mg by mouth every 8 (eight) hours as needed for anxiety. 07/10/22  Yes [name], [name], MD  aspirin 81 MG tablet  Take 81 mg by mouth daily.   Yes [provider]  budesonide (PULMICORT) 0.5 MG/2ML nebulizer solution Inhale 0.5 mg into the lungs every 12 (twelve) hours.   Yes [provider]  cyanocobalamin (VITAMIN B12) 1000 MCG tablet Take 1 tablet (1,000 mcg total) by mouth daily. 07/10/22  Yes [name], [name], MD  diclofenac Sodium (VOLTAREN) 1 % GEL Apply 4 g topically every 6 (six) hours as needed (pain).   Yes [provider]  ELIQUIS 5 MG TABS tablet Take 5 mg by mouth 2 (two) times daily. 04/12/21  Yes [provider]  ferrous sulfate 324 MG TBEC Take 324 mg by mouth daily.   Yes [provider]  folic acid (FOLVITE) 1 MG tablet Take 1 tablet (1 mg total) by mouth daily. 07/10/22  Yes [name], [name], MD  gabapentin (NEURONTIN) 400 MG capsule Take 400 mg by mouth 3 (three) times daily.   Yes [provider]  GLUCAGON HCL IJ Inject 1 mg into the skin daily as needed (Hypoglycemia).   Yes [provider]  insulin aspart (NOVOLOG) 100 UNIT/ML injection insulin aspart (novoLOG) injection 0-24 Units 0-24 Units, Subcutaneous, 3 times daily with meals, CBG < 70: Implement Hypoglycemia measures, call MD CBG < 120 (dose in units): 0 CBG 120 - 160 (dose in units): 2 CBG 161 - 200 (dose in units): 4 CBG 201 - 250 (dose in units): 8 CBG 251 - 300 (dose in units): 12 CBG 301 - 350 (dose in units): 16 CBG 351 - 450 (dose in units): 20 CBG > 450: 24 units and obtain STAT glucose and notify MD 07/10/22  Yes [name], [name], MD  levothyroxine (SYNTHROID) 150 MCG tablet Take 1 tablet (150 mcg total) by mouth daily at 6 (six) AM. 07/10/22  Yes [name], [name], MD  Menthol, Topical Analgesic, (BIOFREEZE EX) Apply 1 application  topically as needed (fo pain).   Yes [provider]  pantoprazole (PROTONIX) 40 MG tablet Take 1 tablet (40 mg total) by mouth 2 (two) times daily. 07/10/22  Yes [name], [name], MD  polyethylene glycol  (MIRALAX / GLYCOLAX) 17 g packet Take 17 g by mouth daily. 07/10/22  Yes [name], [name], MD  potassium chloride SA (KLOR-CON M) 20 MEQ tablet Take 1 tablet (20 mEq total) by mouth daily. 07/10/22  Yes [name], [name], MD  senna-docusate (SENOKOT-S) 8.6-50 MG tablet Take 2 tablets by mouth 2 (two) times daily. Patient taking differently: Take 1 tablet by mouth 2 (two) times daily. 07/10/22  Yes [name], [name], MD  torsemide (DEMADEX) 20 MG tablet Take 40 mg by mouth daily.   Yes [provider]  Cholecalciferol 125 MCG (5000 UT) TABS Take 1 tablet (5,000 Units total) by mouth daily. Patient not taking: Reported on 07/18/2022 07/10/22   [name],  MD  torsemide 40 MG TABS Take 40 mg by mouth daily. Patient not taking: Reported on 07/18/2022 07/11/22   [name], MD    Scheduled Meds:  budesonide  0.5 mg Inhalation Q12H   gabapentin  400 mg Oral TID   levothyroxine  150 mcg Oral Q0600   pantoprazole (PROTONIX) IV  40 mg Intravenous Q12H   potassium chloride  20 mEq Oral Daily   senna-docusate  1 tablet Oral BID   torsemide  40 mg Oral Daily   Continuous Infusions:  sodium chloride     PRN Meds:.acetaminophen **OR** acetaminophen, albuterol, ALPRAZolam, diclofenac Sodium, oxyCODONE, prochlorperazine  Allergies as of 07/18/2022 - Review Complete 07/18/2022  Allergen Reaction Noted   Iodinated contrast media Anaphylaxis 04/08/2022   Penicillins Anaphylaxis 11/28/2010   Insulin lispro Other (See Comments) 11/23/2021   Minoxidil  03/14/2011    Family History  Problem Relation Age of Onset   Heart disease Father     Social History   Socioeconomic History   Marital status: Significant Other    Spouse name: Not on file   Number of children: Not on file   Years of education: Not on file   Highest education level: Not on file  Occupational History   Occupation: disabled  Tobacco Use   Smoking status: Never   Smokeless tobacco: Never  Vaping  Use   Vaping Use: Never used  Substance and Sexual Activity   Alcohol use: Not Currently   Drug use: No   Sexual activity: Not Currently  Other Topics Concern   Not on file  Social History Narrative   Not on file   Social Determinants of Health   Financial Resource Strain: Low Risk  (07/05/2022)   Overall Financial Resource Strain (CARDIA)    Difficulty of Paying Living Expenses: Not very hard  Food Insecurity: No Food Insecurity (07/05/2022)   Hunger Vital Sign    Worried About Running Out of Food in the Last Year: Never true    Ran Out of Food in the Last Year: Never true  Transportation Needs: No Transportation Needs (07/05/2022)   PRAPARE - Hydrologist (Medical): No    Lack of Transportation (Non-Medical): No  Physical Activity: Not on file  Stress: Not on file  Social Connections: Not on file  Intimate Partner Violence: Not on file    Review of Systems: All negative except as stated above in HPI.  Physical Exam:Physical Exam Constitutional:      General: She is not in acute distress.    Appearance: Normal appearance. She is obese. She is ill-appearing.  HENT:     Head: Normocephalic and atraumatic.     Right Ear: External ear normal.     Left Ear: External ear normal.     Nose: Nose normal.     Mouth/Throat:     Mouth: Mucous membranes are moist.  Eyes:     Pupils: Pupils are equal, round, and reactive to light.  Cardiovascular:     Rate and Rhythm: Normal rate and regular rhythm.     Pulses: Normal pulses.     Heart sounds: Murmur heard.  Pulmonary:     Effort: Pulmonary effort is normal.     Breath sounds: Normal breath sounds.  Abdominal:     General: Abdomen is flat. Bowel sounds are normal. There is no distension.     Palpations: Abdomen is soft. There is no mass.     Tenderness: There is no  abdominal tenderness. There is no guarding or rebound.     Hernia: No hernia is present.  Musculoskeletal:     Cervical back: Normal  range of motion and neck supple.  Skin:    General: Skin is warm and dry.     Coloration: Skin is pale.  Neurological:     General: No focal deficit present.     Mental Status: She is alert and oriented to person, place, and time. Mental status is at baseline.  Psychiatric:        Mood and Affect: Mood normal.        Behavior: Behavior normal.     Vital signs: Vitals:   07/19/22 0830 07/19/22 0852  BP:  98/63  Pulse:  81  Resp:  18  Temp:  97.6 F (36.4 C)  SpO2: 99% 98%   Last BM Date : 07/17/22    GI:  Lab Results: Recent Labs    07/18/22 1547  WBC 6.9  HGB 6.7*  HCT 23.6*  PLT 328   BMET Recent Labs    07/18/22 1547  NA 140  K 3.8  CL 94*  CO2 38*  GLUCOSE 191*  BUN 25*  CREATININE 0.72  CALCIUM 8.4*   LFT Recent Labs    07/18/22 1547  PROT 6.8  ALBUMIN 3.3*  AST 14*  ALT 10  ALKPHOS 70  BILITOT 0.4   PT/INR Recent Labs    07/18/22 1547  LABPROT 16.2*  INR 1.3*     Studies/Results: No results found.  Impression: Anemia Possible melena GI bleed  Patient presenting with anemia, questionable melena.  BUN elevated on admission.  Concern for upper GI bleed.  Hemoglobin 6.7 yesterday, she was given 2 units packed red blood cells, awaiting repeat CBC.  She denies any GI symptoms today.   Plan: Plan for EGD tomorrow. I thoroughly discussed the procedures to include nature, alternatives, benefits, and risks including but not limited to bleeding, perforation, infection, anesthesia/cardiac and pulmonary complications. Patient provides understanding and gave verbal consent to proceed. Continue Protonix IV '40mg'$  BID Continue clear liquid diet NPO at midnight Continue anti-emetics and supportive care as needed. [facility] will follow.     LOS: 0 days   Charlott Rakes  PA-C 07/19/2022, 10:15 AM  Contact #  [phone]

## 2022-07-20 DIAGNOSIS — K922 Gastrointestinal hemorrhage, unspecified: Secondary | ICD-10-CM | POA: Diagnosis not present

## 2022-07-20 LAB — TYPE AND SCREEN
ABO/RH(D): A POS
Antibody Screen: NEGATIVE
Unit division: 0
Unit division: 0

## 2022-07-20 LAB — GLUCOSE, CAPILLARY
Glucose-Capillary: 136 mg/dL — ABNORMAL HIGH (ref 70–99)
Glucose-Capillary: 127 mg/dL — ABNORMAL HIGH (ref 70–99)
Glucose-Capillary: 192 mg/dL — ABNORMAL HIGH (ref 70–99)
Glucose-Capillary: 173 mg/dL — ABNORMAL HIGH (ref 70–99)

## 2022-07-20 LAB — BPAM RBC
Blood Product Expiration Date: 202311122359
Blood Product Expiration Date: 202311142359
ISSUE DATE / TIME: 202310182231
ISSUE DATE / TIME: 202310190146
Unit Type and Rh: 6200
Unit Type and Rh: 6200

## 2022-07-20 LAB — CBC
HCT: 28.2 % — ABNORMAL LOW (ref 36.0–46.0)
Hemoglobin: 8.1 g/dL — ABNORMAL LOW (ref 12.0–15.0)
MCH: 26.4 pg (ref 26.0–34.0)
MCHC: 28.7 g/dL — ABNORMAL LOW (ref 30.0–36.0)
MCV: 91.9 fL (ref 80.0–100.0)
Platelets: 234 10*3/uL (ref 150–400)
RBC: 3.07 MIL/uL — ABNORMAL LOW (ref 3.87–5.11)
RDW: 17 % — ABNORMAL HIGH (ref 11.5–15.5)
WBC: 5.9 10*3/uL (ref 4.0–10.5)
nRBC: 0 % (ref 0.0–0.2)

## 2022-07-20 MED ORDER — DIPHENHYDRAMINE HCL 25 MG PO CAPS
25.0000 mg | ORAL_CAPSULE | Freq: Three times a day (TID) | ORAL | Status: DC | PRN
  Administered 2022-07-20 – 2022-07-23 (×2): 25 mg via ORAL
  Filled 2022-07-20 (×2): qty 1

## 2022-07-20 NOTE — Progress Notes (Signed)
[facility] Progress Note  Chelsea [name] 56 y.o. May 31, 1966   Subjective: Patient seen and examined laying in bed.  Denies abdominal pain.  No new GI complaints.  Does note some increased pruritus.    Objective: Vital signs in last 24 hours: Vitals:   07/20/22 0645 07/20/22 0802  BP: 98/72   Pulse: 72   Resp:    Temp: 97.7 F (36.5 C)   SpO2: 95% 95%    Physical Exam:  General:  Alert, cooperative, no distress, appears stated age, morbidly obese  Head:  Normocephalic, without obvious abnormality, atraumatic  Eyes:  Anicteric sclera, EOM's intact  Lungs:   Clear to auscultation bilaterally, respirations unlabored  Heart:  Regular rate and rhythm, S1, S2 normal  Abdomen:   Soft, non-tender, bowel sounds active all four quadrants,  no masses,     Lab Results: Recent Labs    07/18/22 1547 07/19/22 0952  NA 140 142  K 3.8 3.8  CL 94* 96*  CO2 38* 38*  GLUCOSE 191* 140*  BUN 25* 21*  CREATININE 0.72 0.59  CALCIUM 8.4* 8.6*   Recent Labs    07/18/22 1547 07/19/22 0952  AST 14* 14*  ALT 10 10  ALKPHOS 70 79  BILITOT 0.4 0.7  PROT 6.8 7.2  ALBUMIN 3.3* 3.3*   Recent Labs    07/19/22 0952 07/20/22 0420  WBC 5.9 5.9  HGB 9.1* 8.1*  HCT 31.0* 28.2*  MCV 90.9 91.9  PLT 257 234   Recent Labs    07/18/22 1547  LABPROT 16.2*  INR 1.3*      Assessment Anemia Melena CHF COPD Aortic stenosis  Hemoglobin 8.1 from 9.1 yesterday.  She has had 2 units of blood this admission last yesterday.    Plan: Plan for EGD tomorrow. I thoroughly discussed the procedures to include nature, alternatives, benefits, and risks including but not limited to bleeding, perforation, infection, anesthesia/cardiac and pulmonary complications. Patient provides understanding and gave verbal consent to proceed. Continue Protonix IV '40mg'$  BID Can have soft diet NPO at midnight Continue anti-emetics and supportive care as needed. [facility] will follow.      Arvella Nigh Depaul Arizpe PA-C 07/20/2022, 10:54 AM  Contact #  [phone]

## 2022-07-20 NOTE — Progress Notes (Addendum)
PROGRESS NOTE  Chelsea [name]  ZOX:[id number] DOB: [birth year] DOA: 07/18/2022 PCP: Rudene Anda, MD   Brief Narrative:  Patient is a 56 year old female with history of aortic stenosis, osteoarthritis, asthma, COPD, tracheostomy, obesity hypoventilation syndrome, chronic respiratory failure with hypoxia, morbid obesity, coronary disease, chronic diastolic CHF, GERD, hepatitis C, hyperlipidemia, hypertension, hypothyroidism, type 2 diabetes who presents from [facility] health care for for the evaluation of low hemoglobin , melena.  Patient was also lightheaded, more tired than usual. On presentation, she was mildly hypotensive.  Hemoglobin was in the range of 6 on presentation.  Given 2 units of blood transfusion.  GI consulted.Plan for EGD tomorrow  Assessment & Plan:  Principal Problem:   GI bleed Active Problems:   Obstructive sleep apnea   Hypothyroidism   Chronic diastolic heart failure (HCC)   Obesity hypoventilation syndrome (HCC)   Atrial flutter (HCC)   Mononeuropathy of lower extremity   Gastro-esophageal reflux disease without esophagitis   Essential (primary) hypertension   Class 3 severe obesity with serious comorbidity and body mass index (BMI) of 50.0 to 59.9 in adult [facility])   Paroxysmal atrial fibrillation (HCC)   Acute blood loss anemia   Acute blood loss anemia/upper GI bleed: Presented with weakness, melena, fatigue.  Hemoglobin in the range of 6 on presentation.  Transfused 2 units  of PRBC.  Hb now in the range of 8.  GI consulted.  Currently on Protonix 40 mg IV twice. Plan for EGD tomorrow  Morbid obesity /obesity hypoventilation syndrome/ OSA/chronic hypoxic respiratory failure: On BiPAP/CPAP at bedtime.  BMI 53.3.  On 4 L of oxygen at baseline.  Hypothyroidism: On levothyroxine  Chronic diastolic CHF: Currently euvolemic.  Continue torsemide 40 mg twice daily.  Continue potassium supplementation  Paroxysmal A-fib: Currently in normal sinus rhythm.  Eliquis  on hold due to GI bleed  Neuropathy of lower extremities/chronic pain syndrome: Continue gabapentin.  Complains of pain everywhere.  Has history of chronic pain syndrome and takes hydrocodone  History of GERD: Continue PPI  History of hypertension: Blood pressure remains soft .  Cud be at baseline  Debility: Has been residing at nursing facility.  Unable to walk due to neuropathy.  Bedbound mostly.  TOC following. PT consulted       DVT prophylaxis:SCDs Start: 07/18/22 1839     Code Status: Full Code  Family Communication: None at bedside  Patient status:Observation  Patient is from :SNF  Anticipated discharge to:SNF  Estimated DC date:1-2 days,waiting for GI workup   Consultants: GI  Procedures:None yet  Antimicrobials:  Anti-infectives (From admission, onward)    None       Subjective:  Patient seen and examined at bedside today.  Hemodynamically stable.  No new episodes of melena or hematochezia.  Denies any abdomen pain, nausea or vomiting.  Objective: Vitals:   07/19/22 1126 07/19/22 2003 07/20/22 0645 07/20/22 0802  BP: 114/64 109/62 98/72   Pulse: 79 75 72   Resp: 18     Temp: 97.8 F (36.6 C) 97.8 F (36.6 C) 97.7 F (36.5 C)   TempSrc: Oral Oral Oral   SpO2: 98% 98% 95% 95%  Weight:   (!) 143.5 kg   Height:        Intake/Output Summary (Last 24 hours) at 07/20/2022 1400 Last data filed at 07/20/2022 1247 Gross per 24 hour  Intake --  Output 1000 ml  Net -1000 ml   Filed Weights   07/18/22 1530 07/18/22 2100 07/20/22 0645  Weight: Marland Kitchen)  140.2 kg (!) 145.5 kg (!) 143.5 kg    Examination:  General exam: Overall comfortable, not in distress, morbidly obese HEENT: PERRL Respiratory system:  no wheezes or crackles ,pansystolic murmur Cardiovascular system: S1 & S2 heard, RRR.  Gastrointestinal system: Abdomen is nondistended, soft and nontender. Central nervous system: Alert and oriented Extremities: Bilateral lower extremity  lymphedema, no clubbing ,no cyanosis Skin: No rashes, no ulcers,no icterus     Data Reviewed: I have personally reviewed following labs and imaging studies  CBC: Recent Labs  Lab 07/18/22 1547 07/19/22 0952 07/20/22 0420  WBC 6.9 5.9 5.9  HGB 6.7* 9.1* 8.1*  HCT 23.6* 31.0* 28.2*  MCV 90.8 90.9 91.9  PLT 328 257 124   Basic Metabolic Panel: Recent Labs  Lab 07/18/22 1547 07/19/22 0952  NA 140 142  K 3.8 3.8  CL 94* 96*  CO2 38* 38*  GLUCOSE 191* 140*  BUN 25* 21*  CREATININE 0.72 0.59  CALCIUM 8.4* 8.6*     No results found for this or any previous visit (from the past 240 hour(s)).   Radiology Studies: DG CHEST PORT 1 VIEW  Result Date: 07/19/2022 CLINICAL DATA:  Cough history of asthma EXAM: PORTABLE CHEST 1 VIEW COMPARISON:  07/04/2022 FINDINGS: Cardiomegaly with vascular congestion. No consolidation, pleural effusion, or pneumothorax. Streaky atelectasis at the right base. Aortic atherosclerosis IMPRESSION: Cardiomegaly with vascular congestion. Streaky atelectasis right base Electronically Signed   By: Donavan Foil M.D.   On: 07/19/2022 15:34    Scheduled Meds:  budesonide  0.5 mg Inhalation Q12H   gabapentin  400 mg Oral TID   guaiFENesin  600 mg Oral BID   insulin aspart  0-15 Units Subcutaneous TID WC   levothyroxine  150 mcg Oral Q0600   pantoprazole (PROTONIX) IV  40 mg Intravenous Q12H   potassium chloride  20 mEq Oral Daily   senna-docusate  1 tablet Oral BID   torsemide  40 mg Oral Daily   Continuous Infusions:  sodium chloride       LOS: 0 days   Shelly Coss, MD [facility] P10/20/2023, 2:00 PM

## 2022-07-20 NOTE — H&P (View-Only) (Signed)
[facility] Progress Note  Chelsea [name] 56 y.o. 16-Dec-1965   Subjective: Patient seen and examined laying in bed.  Denies abdominal pain.  No new GI complaints.  Does note some increased pruritus.    Objective: Vital signs in last 24 hours: Vitals:   07/20/22 0645 07/20/22 0802  BP: 98/72   Pulse: 72   Resp:    Temp: 97.7 F (36.5 C)   SpO2: 95% 95%    Physical Exam:  General:  Alert, cooperative, no distress, appears stated age, morbidly obese  Head:  Normocephalic, without obvious abnormality, atraumatic  Eyes:  Anicteric sclera, EOM's intact  Lungs:   Clear to auscultation bilaterally, respirations unlabored  Heart:  Regular rate and rhythm, S1, S2 normal  Abdomen:   Soft, non-tender, bowel sounds active all four quadrants,  no masses,     Lab Results: Recent Labs    07/18/22 1547 07/19/22 0952  NA 140 142  K 3.8 3.8  CL 94* 96*  CO2 38* 38*  GLUCOSE 191* 140*  BUN 25* 21*  CREATININE 0.72 0.59  CALCIUM 8.4* 8.6*   Recent Labs    07/18/22 1547 07/19/22 0952  AST 14* 14*  ALT 10 10  ALKPHOS 70 79  BILITOT 0.4 0.7  PROT 6.8 7.2  ALBUMIN 3.3* 3.3*   Recent Labs    07/19/22 0952 07/20/22 0420  WBC 5.9 5.9  HGB 9.1* 8.1*  HCT 31.0* 28.2*  MCV 90.9 91.9  PLT 257 234   Recent Labs    07/18/22 1547  LABPROT 16.2*  INR 1.3*      Assessment Anemia Melena CHF COPD Aortic stenosis  Hemoglobin 8.1 from 9.1 yesterday.  She has had 2 units of blood this admission last yesterday.    Plan: Plan for EGD tomorrow. I thoroughly discussed the procedures to include nature, alternatives, benefits, and risks including but not limited to bleeding, perforation, infection, anesthesia/cardiac and pulmonary complications. Patient provides understanding and gave verbal consent to proceed. Continue Protonix IV '40mg'$  BID Can have soft diet NPO at midnight Continue anti-emetics and supportive care as needed. [facility] will follow.      Chelsea Nigh [name] PA-C 07/20/2022, 10:54 AM  Contact #  [phone]

## 2022-07-20 NOTE — Plan of Care (Signed)

## 2022-07-20 NOTE — Evaluation (Signed)
Physical Therapy Evaluation Patient Details Name: Chelsea [name] MRN: [id number] DOB: 02-13-1966 Today's Date: 07/20/2022  History of Present Illness  56 year old female with history of aortic stenosis, osteoarthritis, asthma, COPD, tracheostomy, obesity hypoventilation syndrome, chronic respiratory failure with hypoxia, morbid obesity, coronary disease, chronic diastolic CHF, GERD, hepatitis C, hyperlipidemia, hypertension, hypothyroidism, type 2 diabetes who presents from [facility] health care for for the evaluation of low hemoglobin , melena.  Patient was also lightheaded, more tired than usual. On presentation, she was mildly hypotensive.  Hemoglobin was in the range of 6 on presentation.  Given 2 units of blood transfusion.  GI consulted.Plan for EGD tomorrow. This is pt's 9th hospitalization in 2023.  Clinical Impression  Pt admitted with above diagnosis. Per pt, at baseline she is transferred to a WC via a mechanical lift, she's been working on standing and LE strengthening with PT at a SNF. She stated she's not been able to walk for 6 months due to frequent hospitalizations over the past year. Min assist for supine to sit, pt sat at edge of bed for ~6 minutes and performed BLE exercises.  Pt currently with functional limitations due to the deficits listed below (see PT Problem List). Pt will benefit from skilled PT to increase their independence and safety with mobility to allow discharge to the venue listed below.          Recommendations for follow up therapy are one component of a multi-disciplinary discharge planning process, led by the attending physician.  Recommendations may be updated based on patient status, additional functional criteria and insurance authorization.  Follow Up Recommendations Skilled nursing-short term rehab (<3 hours/day) Can patient physically be transported by private vehicle: No    Assistance Recommended at Discharge Frequent or constant  Supervision/Assistance  Patient can return home with the following  Two people to help with walking and/or transfers;Two people to help with bathing/dressing/bathroom;Assistance with cooking/housework;Assist for transportation;Help with stairs or ramp for entrance    Equipment Recommendations None recommended by PT  Recommendations for Other Services       Functional Status Assessment       Precautions / Restrictions Precautions Precautions: Fall Restrictions Weight Bearing Restrictions: No      Mobility  Bed Mobility Overal bed mobility: Needs Assistance Bed Mobility: Supine to Sit, Sit to Supine     Supine to sit: Min assist, HOB elevated, +2 for safety/equipment, +2 for physical assistance Sit to supine: +2 for physical assistance, +2 for safety/equipment, Mod assist   General bed mobility comments: assist to raise trunk and pivot hips to edge of bed; assist for BLEs into bed and then to scoot up to [facility] (pt did assist with scoot with BLEs in hook lying and RUE pulling on rail). Pt sat EOB x 6 minutes.    Transfers                   General transfer comment: deferred, Harrel Lemon to [facility] at baseline    Ambulation/Gait                  Stairs            Wheelchair Mobility    Modified Rankin (Stroke Patients Only)       Balance Overall balance assessment: Needs assistance Sitting-balance support: Feet supported, No upper extremity supported Sitting balance-Leahy Scale: Fair  Pertinent Vitals/Pain Pain Assessment Pain Assessment: 0-10 Pain Score: 4  Pain Location: B feet Pain Descriptors / Indicators: Aching Pain Intervention(s): Limited activity within patient's tolerance, Monitored during session, Repositioned    Home Living Family/patient expects to be discharged to:: Skilled nursing facility                   Additional Comments: from [place]. Pt transitioned from  [place] to [employer] [facility]    Prior Function Prior Level of Function : Needs assist       Physical Assist : Mobility (physical);ADLs (physical)     Mobility Comments: pt reports she has not walked for ~6 months, has been able to stand with +2 assist and parallel bar at [facility] during SNF stay. Staff utilize [employer] lift to wheelchair. Pt cannot self propel WC due to LUE pain & weakness (pt reports "degenerative arthritis") ADLs Comments: Pt requires assistance with bathing, dressing, hygiene tasks, IADLs     Hand Dominance   Dominant Hand: Right    Extremity/Trunk Assessment   Upper Extremity Assessment Upper Extremity Assessment: LUE deficits/detail LUE Deficits / Details: pt prefers to not use LUE functionally 2* pain (reports "degenerative arthritis throughout whole arm"). Reports decreased sensation in fingers in B hands. LUE Sensation: decreased light touch    Lower Extremity Assessment Lower Extremity Assessment: Generalized weakness;LLE deficits/detail;RLE deficits/detail RLE Deficits / Details: knee ext -4/5 RLE Sensation: decreased light touch;history of peripheral neuropathy LLE Deficits / Details: knee ext +4/5 LLE Sensation: decreased light touch;history of peripheral neuropathy    Cervical / Trunk Assessment Cervical / Trunk Assessment: Normal Cervical / Trunk Exceptions: morbid obesity  Communication   Communication: No difficulties  Cognition Arousal/Alertness: Awake/alert Behavior During Therapy: WFL for tasks assessed/performed Overall Cognitive Status: Within Functional Limits for tasks assessed                                          General Comments      Exercises General Exercises - Lower Extremity Long Arc Quad: AROM, Both, 10 reps, Seated   Assessment/Plan    PT Assessment Patient needs continued PT services  PT Problem List Decreased strength;Decreased range of motion;Decreased activity tolerance;Decreased  balance;Decreased mobility       PT Treatment Interventions DME instruction;Functional mobility training;Therapeutic activities;Therapeutic exercise;Balance training;Neuromuscular re-education;Patient/family education;Wheelchair mobility training    PT Goals (Current goals can be found in the Care Plan section)  Acute Rehab PT Goals Patient Stated Goal: to improve LE strength and be able to walk again PT Goal Formulation: With patient Time For Goal Achievement: 07/21/22 Potential to Achieve Goals: Poor    Frequency Min 2X/week     Co-evaluation               AM-PAC PT "6 Clicks" Mobility  Outcome Measure Help needed turning from your back to your side while in a flat bed without using bedrails?: A Lot Help needed moving from lying on your back to sitting on the side of a flat bed without using bedrails?: A Lot Help needed moving to and from a bed to a chair (including a wheelchair)?: Total Help needed standing up from a chair using your arms (e.g., wheelchair or bedside chair)?: Total Help needed to walk in hospital room?: Total Help needed climbing 3-5 steps with a railing? : Total 6 Click Score: 8    End of  Session Equipment Utilized During Treatment: Oxygen Activity Tolerance: Patient tolerated treatment well Patient left: in bed;with call bell/phone within reach;with bed alarm set Nurse Communication: Mobility status;Need for lift equipment PT Visit Diagnosis: Other abnormalities of gait and mobility (R26.89);Muscle weakness (generalized) (M62.81)    Time: 1219-7588 PT Time Calculation (min) (ACUTE ONLY): 21 min   Charges:   PT Evaluation $PT Eval Moderate Complexity: 1 Mod          Philomena Doheny PT 07/20/2022  Acute Rehabilitation Services  Office [phone]

## 2022-07-20 NOTE — TOC Initial Note (Signed)
Transition of Care [facility]) - Initial/Assessment Note    Patient Details  Name: Chelsea [name] MRN: [id number] Date of Birth: 21-Aug-1966  Transition of Care [facility]) CM/SW Contact:    Roseanne Kaufman, RN Phone Number: 07/20/2022, 12:02 PM  Clinical Narrative:     Patient from [facility] as short term rehab resident. Notified Kia with [facility] to confirm patient can return once medically stable. Will complete FL2 for SNF once PT has evaluated patient, notified MD for order.    Spoke with patient who reports discharge plan is to return to [name] to continue short term rehab.  TOC will continue to follow for dc needs.              Expected Discharge Plan: Skilled Nursing Facility Barriers to Discharge: Continued Medical Work up   Patient Goals and CMS Choice Patient states their goals for this hospitalization and ongoing recovery are:: return to [facility] care for short term rehab   Choice offered to / list presented to : Patient  Expected Discharge Plan and Services Expected Discharge Plan: [place] In-house Referral: NA Discharge Planning Services: CM Consult Post Acute Care Choice: [place] Living arrangements for the past 2 months: [place]                 DME Arranged: N/A DME Agency: NA       HH Arranged: NA [place] Agency: NA        Prior Living Arrangements/Services Living arrangements for the past 2 months: [place] Lives with:: Facility Resident Patient language and need for interpreter reviewed:: Yes Do you feel safe going back to the place where you live?: Yes      Need for Family Participation in Patient Care: No (Comment) Care giver support system in place?: Yes (comment)   Criminal Activity/Legal Involvement Pertinent to Current Situation/Hospitalization: No - Comment as needed  Activities of Daily Living Home Assistive Devices/Equipment: CPAP, Shower  chair without back, Oxygen, CBG Meter, Wheelchair, Cane (specify quad or straight), Walker (specify type) (O2 concentrator; straight cane; rollator; front wheel walker) ADL Screening (condition at time of admission) Patient's cognitive ability adequate to safely complete daily activities?: No Is the patient deaf or have difficulty hearing?: No Does the patient have difficulty seeing, even when wearing glasses/contacts?: No Does the patient have difficulty concentrating, remembering, or making decisions?: No Patient able to express need for assistance with ADLs?: Yes Does the patient have difficulty dressing or bathing?: Yes Independently performs ADLs?: No Communication: Independent Dressing (OT): Needs assistance Is this a change from baseline?: Pre-admission baseline Grooming: Needs assistance Is this a change from baseline?: Pre-admission baseline Feeding: Independent Bathing: Needs assistance Is this a change from baseline?: Pre-admission baseline Toileting: Dependent Is this a change from baseline?: Pre-admission baseline In/Out Bed: Dependent Is this a change from baseline?: Pre-admission baseline Walks in Home: Dependent Is this a change from baseline?: Pre-admission baseline Does the patient have difficulty walking or climbing stairs?: Yes Weakness of Legs: Both Weakness of Arms/Hands: Left  Permission Sought/Granted Permission sought to share information with : Case Manager Permission granted to share information with : Yes, Verbal Permission Granted  Share Information with NAME: Case manager           Emotional Assessment Appearance:: Appears stated age Attitude/Demeanor/Rapport: Engaged Affect (typically observed): Accepting Orientation: : Oriented to Self, Oriented to Place, Oriented to  Time, Oriented to Situation Alcohol / Substance Use: Not Applicable Psych Involvement:  No (comment)  Admission diagnosis:  Melena [K92.1] GI bleed [K92.2] Symptomatic anemia  [D64.9] Patient Active Problem List   Diagnosis Date Noted   GI bleed 07/18/2022   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   Acute blood loss anemia 07/18/2022   Acute on chronic hypoxic respiratory failure ([place]) 07/04/2022   Bleeding of the respiratory tract 04/07/2022   Acute on chronic respiratory failure with hypoxia and hypercapnia (HCC) 04/07/2022   Pulmonary edema, acute ([place]) 02/21/2022   Restrictive lung disease secondary to obesity 01/28/2022   Anemia 01/04/2022   Limitation of activity due to disability 09/18/2021   Paroxysmal atrial fibrillation ([place]) 09/04/2021   DJD (degenerative joint disease) 07/03/2021   Venous insufficiency of both lower extremities 07/02/2021   Physical deconditioning 04/24/2021   Depression 04/20/2021   History of tobacco use 04/20/2021   Hyperglycemia due to type 2 diabetes mellitus ([place]) 04/20/2021   Irritable bowel syndrome with constipation 04/20/2021   Microalbuminuria 04/20/2021   On supplemental oxygen by nasal cannula 04/20/2021   Renal insufficiency 04/20/2021   Heart failure ([place]) 04/20/2021   Moderate aortic stenosis 04/20/2021   Mild tricuspid regurgitation 04/02/2021   Mitral valve sclerosis 04/02/2021   Morbid obesity with BMI of 50.0-59.9, adult ([place]) 04/02/2021   Non-smoker 04/02/2021   Awaiting admission to adequate facility elsewhere 69/62/9528   Metabolic alkalosis 41/32/4401   Restless leg syndrome 03/22/2021   Weakness 03/20/2021   Acute congestive heart failure ([place]) 03/18/2021   Hx of atrial flutter 03/15/2021   Weight loss 03/15/2021   Atrial flutter ([place]) 03/09/2021   S/P hysterectomy 02/19/2021   Pulmonary nodule less than 6 cm determined by computed tomography of lung 02/09/2021   Dyspnea on exertion 02/09/2021   Nocturnal hypoxemia 06/03/2020   Mixed hyperlipidemia 04/18/2020   Hair loss 09/17/2019   Chronic pain of multiple joints 06/18/2019   Irritable bowel syndrome  with diarrhea 06/18/2019   Onychomycosis 06/18/2019   Acute idiopathic gout of left hand 02/16/2019   Tinea pedis of both feet 11/12/2018   Hypertension associated with diabetes ([place]) 08/25/2018   Class 3 severe obesity with serious comorbidity and body mass index (BMI) of 50.0 to 59.9 in adult ([place]) 08/25/2018   Class 3 severe obesity due to excess calories with serious comorbidity and body mass index (BMI) of 60.0 to 69.9 in adult ([place]) 08/25/2018   Chronic respiratory failure with hypoxia ([place]) 07/02/2018   Physical debility 05/10/2018   Hepatitis C    Neuropathy due to type 2 diabetes mellitus ([place]) 02/24/2018   Chronic heart failure with preserved ejection fraction ([place]) 02/23/2018   Dyspnea 12/05/2017   Acute on chronic diastolic CHF (congestive heart failure), NYHA class 2 ([place]) 09/25/2017   Pneumonia due to gram-positive bacteria 08/24/2017   Acute respiratory failure with hypoxia and hypercarbia ([place]) 05/27/2016   Hyperlipidemia 09/28/2015   Chronic obstructive pulmonary disease, unspecified ([place]) 03/04/2015   Bronchitis 05/30/2011   Cellulitis, leg 04/29/2011   Neuropathic pain, leg 04/09/2011   Mononeuropathy of lower extremity 04/09/2011   Edema 03/22/2011   Morbid obesity ([place]) 03/22/2011   History of cervical cancer 03/22/2011   Lymphedema of both lower extremities 03/22/2011   Morbid (severe) obesity due to excess calories ([place]) 03/22/2011   Ankle pain, left 03/14/2011   Obesity hypoventilation syndrome ([place]) 01/01/2011   Osteoarthritis 11/28/2010   Chronic diastolic heart failure ([place]) 11/14/2010   Hypothyroidism 11/13/2010   UNSPEC COMBINED SYSTOLIC&DIASTOLIC HEART FAILURE 02/72/5366   HYPERTHYROIDISM 11/10/2010  DM 11/10/2010   Obstructive sleep apnea 11/10/2010   Essential hypertension 11/10/2010   GERD 11/10/2010   Gastro-esophageal reflux disease without esophagitis 11/10/2010   Essential (primary) hypertension 11/10/2010   Obstructive sleep apnea (adult)  (pediatric) 11/10/2010   Type 2 diabetes mellitus, without long-term current use of insulin ([place]) 11/10/2010   PCP:  Rudene Anda, MD Pharmacy:   [id number] [place], [place] - [address] [place]Phone: [phone]: [phone] [facility] [address]Phone: [phone]: [phone]    Social Determinants of Health (SDOH) Interventions    Readmission Risk Interventions    04/09/2022    4:17 PM  Readmission Risk Prevention Plan  Transportation Screening Complete  PCP or Specialist Appt within 3-5 Days Complete  HRI or [place]  Social Work Consult for RLake Dal[place]arliaPlanning/Counseling Complete  Palliative Care Screening Not Applicable  Medication Review (Press photographer Referral to Pharmacy

## 2022-07-21 ENCOUNTER — Observation Stay (HOSPITAL_BASED_OUTPATIENT_CLINIC_OR_DEPARTMENT_OTHER): Payer: Medicare Other | Admitting: Certified Registered Nurse Anesthetist

## 2022-07-21 ENCOUNTER — Encounter (HOSPITAL_COMMUNITY)
Admission: EM | Disposition: A | Discharge status: Skilled Nursing Facility | Payer: Self-pay | Source: Skilled Nursing Facility | Attending: Internal Medicine

## 2022-07-21 ENCOUNTER — Observation Stay (HOSPITAL_COMMUNITY): Payer: Medicare Other | Admitting: Certified Registered Nurse Anesthetist

## 2022-07-21 ENCOUNTER — Encounter (HOSPITAL_COMMUNITY): Payer: Self-pay

## 2022-07-21 DIAGNOSIS — E785 Hyperlipidemia, unspecified: Secondary | ICD-10-CM | POA: Diagnosis present

## 2022-07-21 DIAGNOSIS — I480 Paroxysmal atrial fibrillation: Secondary | ICD-10-CM | POA: Diagnosis present

## 2022-07-21 DIAGNOSIS — Z6841 Body Mass Index (BMI) 40.0 and over, adult: Secondary | ICD-10-CM | POA: Diagnosis not present

## 2022-07-21 DIAGNOSIS — I509 Heart failure, unspecified: Secondary | ICD-10-CM

## 2022-07-21 DIAGNOSIS — E1141 Type 2 diabetes mellitus with diabetic mononeuropathy: Secondary | ICD-10-CM | POA: Diagnosis present

## 2022-07-21 DIAGNOSIS — E662 Morbid (severe) obesity with alveolar hypoventilation: Secondary | ICD-10-CM | POA: Diagnosis present

## 2022-07-21 DIAGNOSIS — K922 Gastrointestinal hemorrhage, unspecified: Secondary | ICD-10-CM | POA: Diagnosis not present

## 2022-07-21 DIAGNOSIS — K449 Diaphragmatic hernia without obstruction or gangrene: Secondary | ICD-10-CM | POA: Diagnosis present

## 2022-07-21 DIAGNOSIS — E873 Alkalosis: Secondary | ICD-10-CM | POA: Diagnosis present

## 2022-07-21 DIAGNOSIS — K219 Gastro-esophageal reflux disease without esophagitis: Secondary | ICD-10-CM | POA: Diagnosis present

## 2022-07-21 DIAGNOSIS — K921 Melena: Secondary | ICD-10-CM | POA: Diagnosis present

## 2022-07-21 DIAGNOSIS — K2289 Other specified disease of esophagus: Secondary | ICD-10-CM | POA: Diagnosis not present

## 2022-07-21 DIAGNOSIS — I4892 Unspecified atrial flutter: Secondary | ICD-10-CM | POA: Diagnosis present

## 2022-07-21 DIAGNOSIS — I2510 Atherosclerotic heart disease of native coronary artery without angina pectoris: Secondary | ICD-10-CM | POA: Diagnosis not present

## 2022-07-21 DIAGNOSIS — M545 Low back pain, unspecified: Secondary | ICD-10-CM | POA: Diagnosis present

## 2022-07-21 DIAGNOSIS — I350 Nonrheumatic aortic (valve) stenosis: Secondary | ICD-10-CM | POA: Diagnosis present

## 2022-07-21 DIAGNOSIS — J9611 Chronic respiratory failure with hypoxia: Secondary | ICD-10-CM | POA: Diagnosis present

## 2022-07-21 DIAGNOSIS — D62 Acute posthemorrhagic anemia: Secondary | ICD-10-CM | POA: Diagnosis present

## 2022-07-21 DIAGNOSIS — Z794 Long term (current) use of insulin: Secondary | ICD-10-CM | POA: Diagnosis not present

## 2022-07-21 DIAGNOSIS — B1920 Unspecified viral hepatitis C without hepatic coma: Secondary | ICD-10-CM | POA: Diagnosis present

## 2022-07-21 DIAGNOSIS — D649 Anemia, unspecified: Secondary | ICD-10-CM | POA: Diagnosis not present

## 2022-07-21 DIAGNOSIS — Z930 Tracheostomy status: Secondary | ICD-10-CM | POA: Diagnosis not present

## 2022-07-21 DIAGNOSIS — I110 Hypertensive heart disease with heart failure: Secondary | ICD-10-CM

## 2022-07-21 DIAGNOSIS — G894 Chronic pain syndrome: Secondary | ICD-10-CM | POA: Diagnosis present

## 2022-07-21 DIAGNOSIS — I5032 Chronic diastolic (congestive) heart failure: Secondary | ICD-10-CM | POA: Diagnosis present

## 2022-07-21 DIAGNOSIS — E039 Hypothyroidism, unspecified: Secondary | ICD-10-CM | POA: Diagnosis present

## 2022-07-21 DIAGNOSIS — J449 Chronic obstructive pulmonary disease, unspecified: Secondary | ICD-10-CM | POA: Diagnosis present

## 2022-07-21 DIAGNOSIS — M1990 Unspecified osteoarthritis, unspecified site: Secondary | ICD-10-CM | POA: Diagnosis present

## 2022-07-21 HISTORY — PX: ESOPHAGOGASTRODUODENOSCOPY: SHX5428

## 2022-07-21 HISTORY — PX: BIOPSY: SHX5522

## 2022-07-21 LAB — GLUCOSE, CAPILLARY
Glucose-Capillary: 147 mg/dL — ABNORMAL HIGH (ref 70–99)
Glucose-Capillary: 123 mg/dL — ABNORMAL HIGH (ref 70–99)
Glucose-Capillary: 133 mg/dL — ABNORMAL HIGH (ref 70–99)
Glucose-Capillary: 158 mg/dL — ABNORMAL HIGH (ref 70–99)
Glucose-Capillary: 169 mg/dL — ABNORMAL HIGH (ref 70–99)

## 2022-07-21 LAB — CBC
HCT: 26.6 % — ABNORMAL LOW (ref 36.0–46.0)
Hemoglobin: 7.6 g/dL — ABNORMAL LOW (ref 12.0–15.0)
MCH: 25.9 pg — ABNORMAL LOW (ref 26.0–34.0)
MCHC: 28.6 g/dL — ABNORMAL LOW (ref 30.0–36.0)
MCV: 90.5 fL (ref 80.0–100.0)
Platelets: 238 10*3/uL (ref 150–400)
RBC: 2.94 MIL/uL — ABNORMAL LOW (ref 3.87–5.11)
RDW: 16.7 % — ABNORMAL HIGH (ref 11.5–15.5)
WBC: 4.9 10*3/uL (ref 4.0–10.5)
nRBC: 0 % (ref 0.0–0.2)

## 2022-07-21 SURGERY — EGD (ESOPHAGOGASTRODUODENOSCOPY)
Anesthesia: Monitor Anesthesia Care

## 2022-07-21 MED ORDER — DICLOFENAC SODIUM 1 % EX GEL
2.0000 g | Freq: Four times a day (QID) | CUTANEOUS | Status: DC
  Administered 2022-07-21 – 2022-07-22 (×3): 2 g via TOPICAL
  Filled 2022-07-21: qty 100

## 2022-07-21 MED ORDER — PHENYLEPHRINE 80 MCG/ML (10ML) SYRINGE FOR IV PUSH (FOR BLOOD PRESSURE SUPPORT)
PREFILLED_SYRINGE | INTRAVENOUS | Status: DC | PRN
  Administered 2022-07-21: 80 ug via INTRAVENOUS

## 2022-07-21 MED ORDER — LIDOCAINE HCL (CARDIAC) PF 100 MG/5ML IV SOSY
PREFILLED_SYRINGE | INTRAVENOUS | Status: DC | PRN
  Administered 2022-07-21: 100 mg via INTRAVENOUS

## 2022-07-21 MED ORDER — PROPOFOL 500 MG/50ML IV EMUL
INTRAVENOUS | Status: DC | PRN
  Administered 2022-07-21: 55 ug/kg/min via INTRAVENOUS

## 2022-07-21 MED ORDER — PANTOPRAZOLE SODIUM 40 MG PO TBEC
40.0000 mg | DELAYED_RELEASE_TABLET | Freq: Every day | ORAL | Status: DC
  Administered 2022-07-22 – 2022-07-23 (×2): 40 mg via ORAL
  Filled 2022-07-21 (×2): qty 1

## 2022-07-21 MED ORDER — LACTATED RINGERS IV SOLN: INTRAVENOUS | Status: DC | PRN

## 2022-07-21 MED ORDER — SODIUM CHLORIDE 0.9 % IV SOLN: INTRAVENOUS | Status: DC

## 2022-07-21 MED ORDER — PROPOFOL 10 MG/ML IV BOLUS
INTRAVENOUS | Status: DC | PRN
  Administered 2022-07-21 (×5): 20 mg via INTRAVENOUS

## 2022-07-21 NOTE — Progress Notes (Signed)
PROGRESS NOTE  Chelsea [name]  OBS:[id number] DOB: November 29, 1965 DOA: 07/18/2022 PCP: Rudene Anda, MD   Brief Narrative:  Patient is a 56 year old female with history of aortic stenosis, osteoarthritis, asthma, COPD, tracheostomy, obesity hypoventilation syndrome, chronic respiratory failure with hypoxia, morbid obesity, coronary disease, chronic diastolic CHF, GERD, hepatitis C, hyperlipidemia, hypertension, hypothyroidism, type 2 diabetes who presents from [facility] health care for for the evaluation of low hemoglobin , melena.  Patient was also lightheaded, more tired than usual. On presentation, she was mildly hypotensive.  Hemoglobin was in the range of 6 on presentation.  Given 2 units of blood transfusion.  GI consulted.  Underwent EGD today without any significant source of bleeding.  Planning to monitor her 1 more night to make sure her hemoglobin remained stable before discharge back to SNF.  Assessment & Plan:  Principal Problem:   GI bleed Active Problems:   Obstructive sleep apnea   Hypothyroidism   Chronic diastolic heart failure (HCC)   Obesity hypoventilation syndrome (HCC)   Atrial flutter (HCC)   Mononeuropathy of lower extremity   Gastro-esophageal reflux disease without esophagitis   Essential (primary) hypertension   Class 3 severe obesity with serious comorbidity and body mass index (BMI) of 50.0 to 59.9 in adult [facility])   Paroxysmal atrial fibrillation (HCC)   Acute blood loss anemia   Acute blood loss anemia/upper GI bleed: Presented with weakness, melena, fatigue.  Hemoglobin in the range of 6 on presentation.  Transfused 2 units  of PRBC.    GI consulted.  Underwent EGD today. EGD showed small hiatal hernia and irregular Z-line.  Biopsies performed for Barrett's esophagus.  She will be seen as an outpatient by GI.  GI recommended daily Protonix, holding NSAIDs.  GI cleared for restarting Eliquis tomorrow. Her hemoglobin is slowly dropping again.  She denies any  change in the color of the stool.  We will check CBC tomorrow before finalizing on starting on Eliquis  Morbid obesity /obesity hypoventilation syndrome/ OSA/chronic hypoxic respiratory failure: On BiPAP/CPAP at bedtime.  BMI 53.3.  On 4 L of oxygen at baseline.  Hypothyroidism: On levothyroxine  Chronic diastolic CHF: Currently euvolemic.  Continue torsemide 40 mg twice daily.  Continue potassium supplementation  Paroxysmal A-fib: Currently in normal sinus rhythm.  Eliquis on hold due to GI bleed  Neuropathy of lower extremities/chronic pain syndrome: Continue gabapentin.  Complains of pain everywhere.  Has history of chronic pain syndrome and takes hydrocodone  History of GERD: Continue PPI  History of hypertension: Blood pressure remains soft .  Cud be at baseline.She is asymptomatic  Debility: Has been residing at nursing facility.  Unable to walk due to neuropathy.  Bedbound mostly.  TOC following. PT consulted, commended discharge back to facility.       DVT prophylaxis:SCDs Start: 07/18/22 1839     Code Status: Full Code  Family Communication: None at bedside  Patient status:Observation  Patient is from :SNF  Anticipated discharge to:SNF  Estimated DC date: Tomorrow  Consultants: GI  Procedures:EGD  Antimicrobials:  Anti-infectives (From admission, onward)    None       Subjective:  Patient seen and examined at bedside today.  Hemodynamically stable.  Back from EGD.  He has any abdominal pain, nausea or vomiting.  No change in the color of the stool.  No new complaints  Objective: Vitals:   07/21/22 1000 07/21/22 1012 07/21/22 1039 07/21/22 1136  BP: 94/60 (!) 100/58 (!) 105/58 107/61  Pulse: 74 76 73 74  Resp: '18 16 16 20  '$ Temp:   (!) 97.4 F (36.3 C) 98.2 F (36.8 C)  TempSrc:   Oral Oral  SpO2: 93% 95% 94% 95%  Weight:      Height:        Intake/Output Summary (Last 24 hours) at 07/21/2022 1202 Last data filed at 07/21/2022 0931 Gross per  24 hour  Intake 640 ml  Output 1750 ml  Net -1110 ml   Filed Weights   07/18/22 1530 07/18/22 2100 07/20/22 0645  Weight: (!) 140.2 kg (!) 145.5 kg (!) 143.5 kg    Examination:  General exam: Overall comfortable, not in distress, morbidly obese, lying in bed HEENT: PERRL Respiratory system:  no wheezes or crackles  Cardiovascular system: S1 & S2 heard, RRR.  Pansystolic murmur Gastrointestinal system: Abdomen is nondistended, soft and nontender. Central nervous system: Alert and oriented Extremities: Bilateral lower extremity lymphedema, no clubbing ,no cyanosis Skin: No rashes, no ulcers,no icterus     Data Reviewed: I have personally reviewed following labs and imaging studies  CBC: Recent Labs  Lab 07/18/22 1547 07/19/22 0952 07/20/22 0420 07/21/22 0445  WBC 6.9 5.9 5.9 4.9  HGB 6.7* 9.1* 8.1* 7.6*  HCT 23.6* 31.0* 28.2* 26.6*  MCV 90.8 90.9 91.9 90.5  PLT 328 257 234 888   Basic Metabolic Panel: Recent Labs  Lab 07/18/22 1547 07/19/22 0952  NA 140 142  K 3.8 3.8  CL 94* 96*  CO2 38* 38*  GLUCOSE 191* 140*  BUN 25* 21*  CREATININE 0.72 0.59  CALCIUM 8.4* 8.6*     No results found for this or any previous visit (from the past 240 hour(s)).   Radiology Studies: DG CHEST PORT 1 VIEW  Result Date: 07/19/2022 CLINICAL DATA:  Cough history of asthma EXAM: PORTABLE CHEST 1 VIEW COMPARISON:  07/04/2022 FINDINGS: Cardiomegaly with vascular congestion. No consolidation, pleural effusion, or pneumothorax. Streaky atelectasis at the right base. Aortic atherosclerosis IMPRESSION: Cardiomegaly with vascular congestion. Streaky atelectasis right base Electronically Signed   By: Donavan Foil M.D.   On: 07/19/2022 15:34    Scheduled Meds:  budesonide  0.5 mg Inhalation Q12H   gabapentin  400 mg Oral TID   guaiFENesin  600 mg Oral BID   insulin aspart  0-15 Units Subcutaneous TID WC   levothyroxine  150 mcg Oral Q0600   [START ON 07/22/2022] pantoprazole  40 mg  Oral Daily   potassium chloride  20 mEq Oral Daily   senna-docusate  1 tablet Oral BID   torsemide  40 mg Oral Daily   Continuous Infusions:  sodium chloride       LOS: 0 days   Shelly Coss, MD [facility] P10/21/2023, 12:02 PM

## 2022-07-21 NOTE — Interval H&P Note (Signed)
History and Physical Interval Note:  07/21/2022 9:12 AM  Chelsea [name]  has presented today for surgery, with the diagnosis of anemia, melena.  The various methods of treatment have been discussed with the patient and family. After consideration of risks, benefits and other options for treatment, the patient has consented to  Procedure(s): ESOPHAGOGASTRODUODENOSCOPY (EGD) (N/A) as a surgical intervention.  The patient's history has been reviewed, patient examined, no change in status, stable for surgery.  I have reviewed the patient's chart and labs.  Questions were answered to the patient's satisfaction.     Ransom Nickson

## 2022-07-21 NOTE — Anesthesia Postprocedure Evaluation (Signed)
Anesthesia Post Note  Patient: DARYL QUIROS  Procedure(s) Performed: ESOPHAGOGASTRODUODENOSCOPY (EGD)     Patient location during evaluation: PACU Anesthesia Type: MAC Level of consciousness: awake and alert Pain management: pain level controlled Vital Signs Assessment: post-procedure vital signs reviewed and stable Respiratory status: spontaneous breathing, nonlabored ventilation and respiratory function stable Cardiovascular status: blood pressure returned to baseline and stable Postop Assessment: no apparent nausea or vomiting Anesthetic complications: no   No notable events documented.  Last Vitals:  Vitals:   07/21/22 1039 07/21/22 1136  BP: (!) 105/58 107/61  Pulse: 73 74  Resp: 16 20  Temp: (!) 36.3 C 36.8 C  SpO2: 94% 95%    Last Pain:  Vitals:   07/21/22 1136  TempSrc: Oral  PainSc:                  Lynda Rainwater

## 2022-07-21 NOTE — Op Note (Signed)
[facility] Patient Name: Chelsea [name] Procedure Date: 07/21/2022 MRN: [id number] Attending MD: Otis Brace , MD Date of Birth: January 09, 1966 CSN: [id number] Age: 56 Admit Type: Inpatient Procedure:                Upper GI endoscopy Indications:              Melena Providers:                Otis Brace, MD, Doristine Johns, RN, Ladona Ridgel, Technician Referring MD:              Medicines:                Sedation Administered by an Anesthesia Professional Complications:            No immediate complications. Estimated Blood Loss:     Estimated blood loss was minimal. Procedure:                Pre-Anesthesia Assessment:                           - Prior to the procedure, a History and Physical                            was performed, and patient medications and                            allergies were reviewed. The patient's tolerance of                            previous anesthesia was also reviewed. The risks                            and benefits of the procedure and the sedation                            options and risks were discussed with the patient.                            All questions were answered, and informed consent                            was obtained. Prior Anticoagulants: The patient has                            taken no previous anticoagulant or antiplatelet                            agents. ASA Grade Assessment: III - A patient with                            severe systemic disease. After reviewing the risks  and benefits, the patient was deemed in                            satisfactory condition to undergo the procedure.                           After obtaining informed consent, the endoscope was                            passed under direct vision. Throughout the                            procedure, the patient's blood pressure, pulse, and                             oxygen saturations were monitored continuously. The                            [id number]-H190 ([id number]) Olympus endoscope was introduced                            through the mouth, and advanced to the second part                            of duodenum. The upper GI endoscopy was                            accomplished without difficulty. The patient                            tolerated the procedure well. Scope In: Scope Out: Findings:      The Z-line was irregular and was found 36 cm from the incisors. Biopsies       were taken with a cold forceps for histology. Estimated blood loss was       minimal.      A small hiatal hernia was present.      No gross lesions were noted in the entire examined stomach.      The cardia and gastric fundus were normal on retroflexion.      The duodenal bulb, first portion of the duodenum and second portion of       the duodenum were normal. Impression:               - Z-line irregular, 36 cm from the incisors.                            Biopsied.                           - Small hiatal hernia.                           - No gross lesions in the stomach.                           - Normal duodenal bulb, first  portion of the                            duodenum and second portion of the duodenum. Moderate Sedation:      Moderate (conscious) sedation was personally administered by an       anesthesia professional. The following parameters were monitored: oxygen       saturation, heart rate, blood pressure, and response to care. Recommendation:           - Return patient to hospital ward for ongoing care.                           - Soft diet.                           - Continue present medications.                           - Await pathology results.                           - Return to GI clinic in 2 months.                           - No ibuprofen, naproxen, or other non-steroidal                            anti-inflammatory drugs. Procedure  Code(s):        --- Professional ---                           [phone], Esophagogastroduodenoscopy, flexible,                            transoral; with biopsy, single or multiple Diagnosis Code(s):        --- Professional ---                           K22.8, Other specified diseases of esophagus                           K44.9, Diaphragmatic hernia without obstruction or                            gangrene                           K92.1, Melena (includes Hematochezia) CPT copyright 2019 American Medical Association. All rights reserved. The codes documented in this report are preliminary and upon coder review may  be revised to meet current compliance requirements. Otis Brace, MD Otis Brace, MD 07/21/2022 9:36:16 AM Number of Addenda: 0

## 2022-07-21 NOTE — Plan of Care (Signed)
  Problem: Clinical Measurements: Goal: Ability to maintain clinical measurements within normal limits will improve Outcome: Progressing   Problem: Pain Managment: Goal: General experience of comfort will improve Outcome: Progressing   Problem: Safety: Goal: Ability to remain free from injury will improve Outcome: Progressing   

## 2022-07-21 NOTE — Brief Op Note (Addendum)
07/18/2022 - 07/21/2022  9:37 AM  PATIENT:  Chelsea [name]  56 y.o. female  PRE-OPERATIVE DIAGNOSIS:  anemia, melena  POST-OPERATIVE DIAGNOSIS:  irregular [id number]   PROCEDURE:  Procedure(s): ESOPHAGOGASTRODUODENOSCOPY (EGD) (N/A)  SURGEON:  Surgeon(s) and Role:    * Nhan Qualley, MD - Primary  Findings -------------- -EGD showed small hiatal hernia and irregular Z-line.  Biopsies performed for Barrett's esophagus.  No evidence of bleeding.  Recommendations ----------------------- -Soft diet and advance as tolerated -Protonix 40 mg daily -Avoid NSAIDs -Okay to resume Eliquis from tomorrow -Further inpatient GI work-up planned.  GI will sign off.  Follow-up in GI clinic in 2 months  Otis Brace MD, [place] 07/21/2022, 9:38 AM  Contact #  [phone]

## 2022-07-21 NOTE — Transfer of Care (Signed)
Immediate Anesthesia Transfer of Care Note  Patient: Chelsea [name]  Procedure(s) Performed: ESOPHAGOGASTRODUODENOSCOPY (EGD)  Patient Location: PACU  Anesthesia Type:MAC  Level of Consciousness: awake, alert , oriented, drowsy and patient cooperative  Airway & Oxygen Therapy: Patient Spontanous Breathing and Patient connected to face mask oxygen  Post-op Assessment: Report given to RN and Post -op Vital signs reviewed and stable  Post vital signs: Reviewed and stable  Last Vitals:  Vitals Value Taken Time  BP 97/65   Temp    Pulse 78 07/21/22 0938  Resp 19 07/21/22 0938  SpO2 97 % 07/21/22 0938  Vitals shown include unvalidated device data.  Last Pain:  Vitals:   07/21/22 0828  TempSrc: Temporal  PainSc: 0-No pain      Patients Stated Pain Goal: 2 (80/03/49 1791)  Complications: No notable events documented.

## 2022-07-21 NOTE — Anesthesia Preprocedure Evaluation (Signed)
Anesthesia Evaluation  Patient identified by MRN, date of birth, ID band Patient unresponsive    Reviewed: Allergy & Precautions, Patient's Chart, lab work & pertinent test resultsPreop documentation limited or incomplete due to emergent nature of procedure.  Airway Mallampati: Trach  TM Distance: >3 FB Neck ROM: Full    Dental no notable dental hx.    Pulmonary shortness of breath, asthma , sleep apnea, Continuous Positive Airway Pressure Ventilation and Oxygen sleep apnea , COPD,  COPD inhaler and oxygen dependent,  Obesity hypoventilation syndrome   trach Pulmonary exam normal breath sounds clear to auscultation       Cardiovascular hypertension, Pt. on medications + CAD and +CHF  Normal cardiovascular exam+ Valvular Problems/Murmurs AS and MR  Rhythm:Regular Rate:Normal  ECHO: 1. Moderate to severe aortic stenosis.  2. Left ventricular ejection fraction, by estimation, is 60 to 65%. The  left ventricle has normal function. The left ventricle has no regional  wall motion abnormalities. There is mild left ventricular hypertrophy.  Left ventricular diastolic parameters  are consistent with Grade I diastolic dysfunction (impaired relaxation).  3. Right ventricular systolic function is normal. The right ventricular  size is normal.  4. Left atrial size was severely dilated.  5. The mitral valve is normal in structure. No evidence of mitral valve  regurgitation. No evidence of mitral stenosis.  6. The aortic valve is normal in structure. Aortic valve regurgitation is  mild. Moderate to severe aortic valve stenosis. Aortic valve mean gradient  measures 38.0 mmHg. Aortic valve Vmax measures 3.84 m/s.  7. The inferior vena cava is normal in size with greater than 50%  respiratory variability, suggesting right atrial pressure of 3 mmHg.    Neuro/Psych  Headaches, PSYCHIATRIC DISORDERS Depression  Neuromuscular disease     GI/Hepatic GERD  ,(+) Hepatitis -  Endo/Other  diabetesHypothyroidism Morbid obesity  Renal/GU Renal InsufficiencyRenal disease     Musculoskeletal  (+) Arthritis , Osteoarthritis,    Abdominal (+) + obese,   Peds  Hematology  (+) Blood dyscrasia, anemia ,   Anesthesia Other Findings BLEEDING TRACH SITE  Reproductive/Obstetrics                             Anesthesia Physical  Anesthesia Plan  ASA: 3  Anesthesia Plan: MAC   Post-op Pain Management: Minimal or no pain anticipated   Induction: Intravenous  PONV Risk Score and Plan: 2 and Treatment may vary due to age or medical condition, Ondansetron and Midazolam  Airway Management Planned:   Additional Equipment:   Intra-op Plan:   Post-operative Plan:   Informed Consent: I have reviewed the patients History and Physical, chart, labs and discussed the procedure including the risks, benefits and alternatives for the proposed anesthesia with the patient or authorized representative who has indicated his/her understanding and acceptance.     Only emergency history available and Consent reviewed with POA  Plan Discussed with: Surgeon and CRNA  Anesthesia Plan Comments:         Anesthesia Quick Evaluation

## 2022-07-22 DIAGNOSIS — Z6841 Body Mass Index (BMI) 40.0 and over, adult: Secondary | ICD-10-CM

## 2022-07-22 DIAGNOSIS — D649 Anemia, unspecified: Secondary | ICD-10-CM

## 2022-07-22 DIAGNOSIS — K219 Gastro-esophageal reflux disease without esophagitis: Secondary | ICD-10-CM

## 2022-07-22 DIAGNOSIS — D62 Acute posthemorrhagic anemia: Secondary | ICD-10-CM | POA: Diagnosis not present

## 2022-07-22 DIAGNOSIS — K922 Gastrointestinal hemorrhage, unspecified: Secondary | ICD-10-CM | POA: Diagnosis not present

## 2022-07-22 DIAGNOSIS — I5032 Chronic diastolic (congestive) heart failure: Secondary | ICD-10-CM | POA: Diagnosis not present

## 2022-07-22 DIAGNOSIS — E662 Morbid (severe) obesity with alveolar hypoventilation: Secondary | ICD-10-CM

## 2022-07-22 DIAGNOSIS — I10 Essential (primary) hypertension: Secondary | ICD-10-CM

## 2022-07-22 DIAGNOSIS — G5790 Unspecified mononeuropathy of unspecified lower limb: Secondary | ICD-10-CM

## 2022-07-22 LAB — BASIC METABOLIC PANEL
Anion gap: 8 (ref 5–15)
BUN: 18 mg/dL (ref 6–20)
CO2: 39 mmol/L — ABNORMAL HIGH (ref 22–32)
Calcium: 8.2 mg/dL — ABNORMAL LOW (ref 8.9–10.3)
Chloride: 93 mmol/L — ABNORMAL LOW (ref 98–111)
Creatinine, Ser: 0.76 mg/dL (ref 0.44–1.00)
GFR, Estimated: >60 mL/min (ref >60)
Glucose, Bld: 155 mg/dL — ABNORMAL HIGH (ref 70–99)
Potassium: 3.8 mmol/L (ref 3.5–5.1)
Sodium: 140 mmol/L (ref 135–145)

## 2022-07-22 LAB — CBC
HCT: 26.8 % — ABNORMAL LOW (ref 36.0–46.0)
Hemoglobin: 7.7 g/dL — ABNORMAL LOW (ref 12.0–15.0)
MCH: 25.9 pg — ABNORMAL LOW (ref 26.0–34.0)
MCHC: 28.7 g/dL — ABNORMAL LOW (ref 30.0–36.0)
MCV: 90.2 fL (ref 80.0–100.0)
Platelets: 224 10*3/uL (ref 150–400)
RBC: 2.97 MIL/uL — ABNORMAL LOW (ref 3.87–5.11)
RDW: 16.6 % — ABNORMAL HIGH (ref 11.5–15.5)
WBC: 5.4 10*3/uL (ref 4.0–10.5)
nRBC: 0 % (ref 0.0–0.2)

## 2022-07-22 LAB — GLUCOSE, CAPILLARY
Glucose-Capillary: 120 mg/dL — ABNORMAL HIGH (ref 70–99)
Glucose-Capillary: 237 mg/dL — ABNORMAL HIGH (ref 70–99)
Glucose-Capillary: 116 mg/dL — ABNORMAL HIGH (ref 70–99)
Glucose-Capillary: 173 mg/dL — ABNORMAL HIGH (ref 70–99)

## 2022-07-22 LAB — PREPARE RBC (CROSSMATCH)

## 2022-07-22 MED ORDER — APIXABAN 5 MG PO TABS
5.0000 mg | ORAL_TABLET | Freq: Two times a day (BID) | ORAL | Status: DC
  Administered 2022-07-22 – 2022-07-23 (×3): 5 mg via ORAL
  Filled 2022-07-22 (×3): qty 1

## 2022-07-22 MED ORDER — SODIUM CHLORIDE 0.9 % IV SOLN
250.0000 mg | Freq: Every day | INTRAVENOUS | Status: AC
  Administered 2022-07-22 – 2022-07-23 (×2): 250 mg via INTRAVENOUS
  Filled 2022-07-22 (×2): qty 20

## 2022-07-22 MED ORDER — SODIUM CHLORIDE 0.9% IV SOLUTION: Freq: Once | INTRAVENOUS | Status: AC

## 2022-07-22 NOTE — Plan of Care (Signed)
  Problem: Education: Goal: Knowledge of General Education information will improve Description: Including pain rating scale, medication(s)/side effects and non-pharmacologic comfort measures Outcome: Progressing   Problem: Safety: Goal: Ability to remain free from injury will improve Outcome: Progressing   Problem: Skin Integrity: Goal: Risk for impaired skin integrity will decrease Outcome: Progressing   Problem: Activity: Goal: Risk for activity intolerance will decrease Outcome: Not Progressing

## 2022-07-22 NOTE — NC FL2 (Signed)
[place] LEVEL OF CARE SCREENING TOOL     IDENTIFICATION  Patient Name: SHELL BLANCHETTE Birthdate: 11-30-1965 Sex: female Admission Date (Current Location): 07/18/2022  [place] and [place] Number:  Kathleen Argue [id number] [place] and Address:  [facility],  [place], [place]      Provider Number: [id number]  Attending Physician Name and Address:  Mercy Riding, MD  Relative Name and Phone Number:  Pricilla Riffle Significant other   [phone]  Percell Boston Sister   [phone]    Current Level of Care: Hospital Recommended Level of Care: [place] Prior Approval Number:    Date Approved/Denied:   PASRR Number: [id number]  Discharge Plan: SNF    Current Diagnoses: Patient Active Problem List   Diagnosis Date Noted   Upper GI bleed 07/18/2022   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   Acute blood loss anemia 07/18/2022   Acute on chronic hypoxic respiratory failure ([place]) 07/04/2022   Bleeding of the respiratory tract 04/07/2022   Acute on chronic respiratory failure with hypoxia and hypercapnia ([place]) 04/07/2022   Pulmonary edema, acute ([place]) 02/21/2022   Restrictive lung disease secondary to obesity 01/28/2022   Symptomatic anemia 01/04/2022   Limitation of activity due to disability 09/18/2021   Paroxysmal atrial fibrillation ([place]) 09/04/2021   DJD (degenerative joint disease) 07/03/2021   Venous insufficiency of both lower extremities 07/02/2021   Physical deconditioning 04/24/2021   Depression 04/20/2021   History of tobacco use 04/20/2021   Hyperglycemia due to type 2 diabetes mellitus ([place]) 04/20/2021   Irritable bowel syndrome with constipation 04/20/2021   Microalbuminuria 04/20/2021   On supplemental oxygen by nasal cannula 04/20/2021   Renal insufficiency 04/20/2021   Heart failure ([place]) 04/20/2021   Moderate aortic stenosis 04/20/2021   Mild  tricuspid regurgitation 04/02/2021   Mitral valve sclerosis 04/02/2021   Morbid obesity with BMI of 50.0-59.9, adult ([place]) 04/02/2021   Non-smoker 04/02/2021   Awaiting admission to adequate facility elsewhere 47/06/6282   Metabolic alkalosis 66/29/4765   Restless leg syndrome 03/22/2021   Weakness 03/20/2021   Acute congestive heart failure ([place]) 03/18/2021   Hx of atrial flutter 03/15/2021   Weight loss 03/15/2021   Atrial flutter ([place]) 03/09/2021   S/P hysterectomy 02/19/2021   Pulmonary nodule less than 6 cm determined by computed tomography of lung 02/09/2021   Dyspnea on exertion 02/09/2021   Nocturnal hypoxemia 06/03/2020   Mixed hyperlipidemia 04/18/2020   Hair loss 09/17/2019   Chronic pain of multiple joints 06/18/2019   Irritable bowel syndrome with diarrhea 06/18/2019   Onychomycosis 06/18/2019   Acute idiopathic gout of left hand 02/16/2019   Tinea pedis of both feet 11/12/2018   Hypertension associated with diabetes ([place]) 08/25/2018   Class 3 severe obesity with serious comorbidity and body mass index (BMI) of 50.0 to 59.9 in adult ([place]) 08/25/2018   Class 3 severe obesity due to excess calories with serious comorbidity and body mass index (BMI) of 60.0 to 69.9 in adult [facility]) 08/25/2018   Chronic respiratory failure with hypoxia ([place]) 07/02/2018   Physical debility 05/10/2018   Hepatitis C    Neuropathy due to type 2 diabetes mellitus ([place]) 02/24/2018   Chronic heart failure with preserved ejection fraction ([place]) 02/23/2018   Dyspnea 12/05/2017   Acute on chronic diastolic CHF (congestive heart failure), NYHA class 2 ([place]) 09/25/2017   Pneumonia due to gram-positive bacteria 08/24/2017   Acute respiratory failure with hypoxia and hypercarbia ([place]) 05/27/2016  Hyperlipidemia 09/28/2015   Chronic obstructive pulmonary disease, unspecified ([place]) 03/04/2015   Bronchitis 05/30/2011   Cellulitis, leg 04/29/2011   Neuropathic pain, leg 04/09/2011   Mononeuropathy of  lower extremity 04/09/2011   Edema 03/22/2011   Morbid obesity ([place]) 03/22/2011   History of cervical cancer 03/22/2011   Lymphedema of both lower extremities 03/22/2011   Morbid (severe) obesity due to excess calories ([place]) 03/22/2011   Ankle pain, left 03/14/2011   Obesity hypoventilation syndrome ([employer]) 01/01/2011   Osteoarthritis 11/28/2010   Chronic diastolic heart failure ([place]) 11/14/2010   Hypothyroidism 11/13/2010   UNSPEC COMBINED SYSTOLIC&DIASTOLIC HEART FAILURE 02/77/4128   HYPERTHYROIDISM 11/10/2010   DM 11/10/2010   Obstructive sleep apnea 11/10/2010   Essential hypertension 11/10/2010   GERD 11/10/2010   Gastro-esophageal reflux disease without esophagitis 11/10/2010   Essential (primary) hypertension 11/10/2010   Obstructive sleep apnea (adult) (pediatric) 11/10/2010   Type 2 diabetes mellitus, without long-term current use of insulin ([place]) 11/10/2010    Orientation RESPIRATION BLADDER Height & Weight     Self, Time, Situation, Place  O2 (2L) Incontinent Weight: (!) 316 lb 5.8 oz (143.5 kg) Height:  '5\' 5"'$  (165.1 cm)  BEHAVIORAL SYMPTOMS/MOOD NEUROLOGICAL BOWEL NUTRITION STATUS      Continent    AMBULATORY STATUS COMMUNICATION OF NEEDS Skin   Limited Assist Verbally Normal                       Personal Care Assistance Level of Assistance  Bathing, Feeding, Dressing Bathing Assistance: Limited assistance Feeding assistance: Independent Dressing Assistance: Limited assistance     Functional Limitations Info  Sight, Hearing, Speech Sight Info: Impaired Hearing Info: Adequate Speech Info: Adequate    SPECIAL CARE FACTORS FREQUENCY  PT (By licensed PT), OT (By licensed OT)     PT Frequency: Minimum 5x a week OT Frequency: Minimum 5x a week            Contractures Contractures Info: Not present    Additional Factors Info  Code Status, Allergies, Insulin Sliding Scale Code Status Info: Full Code Allergies Info: Iodinated Contrast Media    Penicillins   Insulin Lispro   Minoxidil   Insulin Sliding Scale Info: insulin aspart (novoLOG) injection 0-15 Units 3x a day with meals       Current Medications (07/22/2022):  This is the current hospital active medication list Current Facility-Administered Medications  Medication Dose Route Frequency Provider Last Rate Last Admin   acetaminophen (TYLENOL) tablet 650 mg  650 mg Oral Q6H PRN Reubin Milan, MD   650 mg at 07/22/22 1738   Or   acetaminophen (TYLENOL) suppository 650 mg  650 mg Rectal Q6H PRN Reubin Milan, MD       albuterol (PROVENTIL) (2.5 MG/3ML) 0.083% nebulizer solution 2.5 mg  2.5 mg Nebulization Q6H PRN Reubin Milan, MD       ALPRAZolam Duanne Moron) tablet 0.5 mg  0.5 mg Oral Q8H PRN Reubin Milan, MD   0.5 mg at 07/22/22 0315   apixaban (ELIQUIS) tablet 5 mg  5 mg Oral BID Wendee Beavers T, MD   5 mg at 07/22/22 1125   budesonide (PULMICORT) nebulizer solution 0.5 mg  0.5 mg Inhalation Q12H Reubin Milan, MD   0.5 mg at 07/22/22 7867   diclofenac Sodium (VOLTAREN) 1 % topical gel 2 g  2 g Topical QID Shelly Coss, MD   2 g at 07/22/22 1728   diclofenac Sodium (VOLTAREN) 1 % topical  gel 4 g  4 g Topical Q6H PRN Shelly Coss, MD       diphenhydrAMINE (BENADRYL) capsule 25 mg  25 mg Oral Q8H PRN Shelly Coss, MD   25 mg at 07/20/22 1051   ferric gluconate (FERRLECIT) 250 mg in sodium chloride 0.9 % 250 mL IVPB  250 mg Intravenous Daily Gonfa, Taye T, MD       gabapentin (NEURONTIN) capsule 400 mg  400 mg Oral TID Reubin Milan, MD   400 mg at 07/22/22 1738   guaiFENesin (MUCINEX) 12 hr tablet 600 mg  600 mg Oral BID Shelly Coss, MD   600 mg at 07/22/22 0913   insulin aspart (novoLOG) injection 0-15 Units  0-15 Units Subcutaneous TID WC Shelly Coss, MD   5 Units at 07/22/22 1226   levothyroxine (SYNTHROID) tablet 150 mcg  150 mcg Oral Q0600 Reubin Milan, MD   150 mcg at 07/22/22 0601   oxyCODONE (Oxy IR/ROXICODONE)  immediate release tablet 10 mg  10 mg Oral Q6H PRN Shelly Coss, MD   10 mg at 07/22/22 1301   pantoprazole (PROTONIX) EC tablet 40 mg  40 mg Oral Daily Brahmbhatt, Parag, MD   40 mg at 07/22/22 0913   potassium chloride SA (KLOR-CON M) CR tablet 20 mEq  20 mEq Oral Daily Shelly Coss, MD   20 mEq at 07/22/22 0913   prochlorperazine (COMPAZINE) injection 10 mg  10 mg Intravenous Q6H PRN Reubin Milan, MD       senna-docusate (Senokot-S) tablet 1 tablet  1 tablet Oral BID Reubin Milan, MD   1 tablet at 07/20/22 2200   torsemide (DEMADEX) tablet 40 mg  40 mg Oral Daily Reubin Milan, MD   40 mg at 07/22/22 4917     Discharge Medications: Please see discharge summary for a list of discharge medications.  Relevant Imaging Results:  Relevant Lab Results:   Additional Information SSN [id number]  Ross Ludwig, LCSW

## 2022-07-22 NOTE — TOC Progression Note (Signed)
Transition of Care [facility]) - Progression Note    Patient Details  Name: Chelsea [name] MRN: [id number] Date of Birth: April 14, 1966  Transition of Care [facility]) CM/SW Contact  Ross Ludwig, [place] Phone Number: 07/22/2022, 7:08 PM  Clinical Narrative:     Patient is from [facility], per Kia, patient can return once medically ready for discharge.   Expected Discharge Plan: [place] Barriers to Discharge: Continued Medical Work up  Expected Discharge Plan and Services Expected Discharge Plan: [place] In-house Referral: NA Discharge Planning Services: CM Consult Post Acute Care Choice: [place] Living arrangements for the past 2 months: [place]                 DME Arranged: N/A DME Agency: NA       HH Arranged: NA HH Agency: NA         Social Determinants of Health (SDOH) Interventions    Readmission Risk Interventions    04/09/2022    4:17 PM  Readmission Risk Prevention Plan  Transportation Screening Complete  PCP or Specialist Appt within 3-5 Days Complete  HRI or [place] Agency Complete  Social Work Consult for [place] Planning/Counseling Complete  Palliative Care Screening Not Applicable  Medication Review Press photographer) Referral to Pharmacy

## 2022-07-22 NOTE — Progress Notes (Signed)
PROGRESS NOTE  Chelsea [name] TDS:[id number] DOB: 06/26/1966   PCP: Rudene Anda, MD  Patient is from: SNF  DOA: 07/18/2022 LOS: 1  Chief complaints Chief Complaint  Patient presents with   Abnormal Lab     Brief Narrative / Interim history: 56 year old female with history of aortic stenosis, osteoarthritis, asthma, COPD, tracheostomy, obesity hypoventilation syndrome, chronic respiratory failure with hypoxia, morbid obesity, coronary disease, chronic diastolic CHF, GERD, hepatitis C, hyperlipidemia, hypertension, hypothyroidism, type 2 diabetes who presents from [facility] for for the evaluation of low hemoglobin , melena.  Patient was also lightheaded, more tired than usual. On presentation, she was mildly hypotensive.  Hemoglobin was in the range of 6 on presentation.  Given 2 units of blood transfusion with appropriate response.  GI consulted.  EGD on EGD on 10/21 without significant finding to explain patient's blood loss.  And has not had further bleeding but hemoglobin dropped to 7.6.  Transfused additional 1 unit on 07/2021.  Eliquis resumed after discussion with GI.  Aspirin discontinued and there was no clear indication.  Cleared back to SNF on 07/23/2022 if H&H stable.  Subjective: Seen and examined earlier this morning.  No major events overnight of this morning.  Feels fatigued and tired.  Denies chest pain or shortness of breath.  Denies further bleeding.  Objective: Vitals:   07/22/22 0554 07/22/22 0756 07/22/22 0759 07/22/22 1345  BP: 114/71   134/61  Pulse: 70   88  Resp: 19   20  Temp: 98.6 F (37 C)   98.5 F (36.9 C)  TempSrc: Oral   Oral  SpO2: 99% 95% 95% 92%  Weight:      Height:        Examination:  GENERAL: No apparent distress.  Nontoxic. HEENT: MMM.  Vision and hearing grossly intact.  NECK: Supple.  Recall to assess JVD due to body habitus. RESP:  No IWOB.  Fair aeration bilaterally. CVS:  RRR.  2/6 SEM over RUSB and LUSB.    ABD/GI/GU: BS+. Abd soft, NTND.  MSK/EXT:  Moves extremities some.  Chronic venous insufficiency. SKIN: Chronic venous insufficiency. NEURO: Sleepy but wakes to voice.  Oriented appropriately.  No apparent focal neuro deficit. PSYCH: Calm. Normal affect.   Procedures:  10/21-EGD showed hiatal hernia  Microbiology summarized: None  Assessment and plan: Principal Problem:   Upper GI bleed Active Problems:   Obstructive sleep apnea   Hypothyroidism   Chronic diastolic heart failure (HCC)   Obesity hypoventilation syndrome (HCC)   Atrial flutter (HCC)   Mononeuropathy of lower extremity   Gastro-esophageal reflux disease without esophagitis   Essential (primary) hypertension   Class 3 severe obesity with serious comorbidity and body mass index (BMI) of 50.0 to 59.9 in adult (HCC)   Symptomatic anemia   Paroxysmal atrial fibrillation (HCC)   Acute blood loss anemia  Symptomatic acute blood loss anemia likely due to upper GI bleed: Transfused 2 units with appropriate response but Hgb dropped down to 7.6 likely reaching equilibrium.  EGD without difficulty finding.  No evidence of further bleeding.  He is still fatigued and tired. Recent Labs    07/04/22 1858 07/04/22 2240 07/05/22 0411 07/06/22 0036 07/07/22 0054 07/18/22 1547 07/19/22 0952 07/20/22 0420 07/21/22 0445 07/22/22 0423  HGB 11.2* 9.4* 9.4* 8.6* 9.4* 6.7* 9.1* 8.1* 7.6* 7.7*  -Transfuse 1 unit and check posttransfusion H&H -Okay to resume Eliquis per GI -Discontinued low-dose aspirin.  High risk of bleeding with Eliquis.  No clear indication. -  Continue PPI  Morbid obesity /OHS/ OSA/chronic hypoxic respiratory failure: On BiPAP/CPAP at bedtime.  BMI 53.3.  On 4 L of oxygen at baseline.   Hypothyroidism: TSH within normal. -Continue home Synthroid   Chronic diastolic CHF/moderate aortic stenosis: Difficult to assess fluid status due to body habitus.   -Continue torsemide 40 mg twice daily.   -Monitor  intake and output, weight, renal functions and electrolytes   Paroxysmal A-fib: Currently in normal sinus rhythm.   -Resumed Eliquis after discussion with GI.    Neuropathy of lower extremities/chronic pain syndrome: Takes hydrocodone. -Continue gabapentin, Voltaren gel, Tylenol and as needed oxycodone.   History of GERD:  -Continue PPI   Essential hypertension: Normotensive.     Debility: Has been residing at nursing facility.  Unable to walk due to neuropathy.  Bedbound mostly.  -Likely back to SNF as H&H stable.   Body mass index is 52.65 kg/m.   DVT prophylaxis:  SCDs Start: 07/18/22 1839 apixaban (ELIQUIS) tablet 5 mg  Code Status: Full code Family Communication: None at bedside Level of care: Progressive Status is: Inpatient Remains inpatient appropriate because: Symptomatic anemia   Final disposition: SNF Consultants:  GI  Sch Meds:  Scheduled Meds:  sodium chloride   Intravenous Once   apixaban  5 mg Oral BID   budesonide  0.5 mg Inhalation Q12H   diclofenac Sodium  2 g Topical QID   gabapentin  400 mg Oral TID   guaiFENesin  600 mg Oral BID   insulin aspart  0-15 Units Subcutaneous TID WC   levothyroxine  150 mcg Oral Q0600   pantoprazole  40 mg Oral Daily   potassium chloride  20 mEq Oral Daily   senna-docusate  1 tablet Oral BID   torsemide  40 mg Oral Daily   Continuous Infusions:  sodium chloride     PRN Meds:.acetaminophen **OR** acetaminophen, albuterol, ALPRAZolam, diclofenac Sodium, diphenhydrAMINE, oxyCODONE, prochlorperazine  Antimicrobials: Anti-infectives (From admission, onward)    None        I have personally reviewed the following labs and images: CBC: Recent Labs  Lab 07/18/22 1547 07/19/22 0952 07/20/22 0420 07/21/22 0445 07/22/22 0423  WBC 6.9 5.9 5.9 4.9 5.4  HGB 6.7* 9.1* 8.1* 7.6* 7.7*  HCT 23.6* 31.0* 28.2* 26.6* 26.8*  MCV 90.8 90.9 91.9 90.5 90.2  PLT 328 257 234 238 224   BMP &GFR Recent Labs  Lab  07/18/22 1547 07/19/22 0952 07/22/22 0423  NA 140 142 140  K 3.8 3.8 3.8  CL 94* 96* 93*  CO2 38* 38* 39*  GLUCOSE 191* 140* 155*  BUN 25* 21* 18  CREATININE 0.72 0.59 0.76  CALCIUM 8.4* 8.6* 8.2*   Estimated Creatinine Clearance: 110.8 mL/min (by C-G formula based on SCr of 0.76 mg/dL). Liver & Pancreas: Recent Labs  Lab 07/18/22 1547 07/19/22 0952  AST 14* 14*  ALT 10 10  ALKPHOS 70 79  BILITOT 0.4 0.7  PROT 6.8 7.2  ALBUMIN 3.3* 3.3*   No results for input(s): "LIPASE", "AMYLASE" in the last 168 hours. No results for input(s): "AMMONIA" in the last 168 hours. Diabetic: No results for input(s): "HGBA1C" in the last 72 hours. Recent Labs  Lab 07/21/22 1133 07/21/22 1613 07/21/22 2034 07/22/22 0745 07/22/22 1134  GLUCAP 133* 158* 169* 120* 237*   Cardiac Enzymes: No results for input(s): "CKTOTAL", "CKMB", "CKMBINDEX", "TROPONINI" in the last 168 hours. No results for input(s): "PROBNP" in the last 8760 hours. Coagulation Profile: Recent Labs  Lab 07/18/22  1547  INR 1.3*   Thyroid Function Tests: No results for input(s): "TSH", "T4TOTAL", "FREET4", "T3FREE", "THYROIDAB" in the last 72 hours. Lipid Profile: No results for input(s): "CHOL", "HDL", "LDLCALC", "TRIG", "CHOLHDL", "LDLDIRECT" in the last 72 hours. Anemia Panel: No results for input(s): "VITAMINB12", "FOLATE", "FERRITIN", "TIBC", "IRON", "RETICCTPCT" in the last 72 hours. Urine analysis:    Component Value Date/Time   COLORURINE STRAW (A) 07/04/2022 2300   APPEARANCEUR CLEAR 07/04/2022 2300   LABSPEC 1.008 07/04/2022 2300   PHURINE 6.0 07/04/2022 2300   GLUCOSEU NEGATIVE 07/04/2022 2300   HGBUR NEGATIVE 07/04/2022 2300   BILIRUBINUR NEGATIVE 07/04/2022 2300   KETONESUR NEGATIVE 07/04/2022 2300   PROTEINUR NEGATIVE 07/04/2022 2300   UROBILINOGEN 0.2 12/08/2010 0545   NITRITE NEGATIVE 07/04/2022 2300   LEUKOCYTESUR NEGATIVE 07/04/2022 2300   Sepsis Labs: Invalid input(s): "PROCALCITONIN",  "LACTICIDVEN"  Microbiology: No results found for this or any previous visit (from the past 240 hour(s)).  Radiology Studies: No results found.    Nikira Kushnir T. Mayes  If 7PM-7AM, please contact night-coverage [email or web address] 07/22/2022, 2:54 PM

## 2022-07-23 ENCOUNTER — Encounter (HOSPITAL_COMMUNITY): Payer: Self-pay | Admitting: Gastroenterology

## 2022-07-23 DIAGNOSIS — I4892 Unspecified atrial flutter: Secondary | ICD-10-CM

## 2022-07-23 DIAGNOSIS — D62 Acute posthemorrhagic anemia: Secondary | ICD-10-CM | POA: Diagnosis not present

## 2022-07-23 DIAGNOSIS — D509 Iron deficiency anemia, unspecified: Secondary | ICD-10-CM

## 2022-07-23 DIAGNOSIS — K922 Gastrointestinal hemorrhage, unspecified: Secondary | ICD-10-CM | POA: Diagnosis not present

## 2022-07-23 DIAGNOSIS — D649 Anemia, unspecified: Secondary | ICD-10-CM | POA: Diagnosis not present

## 2022-07-23 LAB — COMPREHENSIVE METABOLIC PANEL
ALT: 10 U/L (ref 0–44)
AST: 13 U/L — ABNORMAL LOW (ref 15–41)
Albumin: 3 g/dL — ABNORMAL LOW (ref 3.5–5.0)
Alkaline Phosphatase: 63 U/L (ref 38–126)
Anion gap: 8 (ref 5–15)
BUN: 23 mg/dL — ABNORMAL HIGH (ref 6–20)
CO2: 39 mmol/L — ABNORMAL HIGH (ref 22–32)
Calcium: 8.2 mg/dL — ABNORMAL LOW (ref 8.9–10.3)
Chloride: 93 mmol/L — ABNORMAL LOW (ref 98–111)
Creatinine, Ser: 0.72 mg/dL (ref 0.44–1.00)
GFR, Estimated: >60 mL/min (ref >60)
Glucose, Bld: 132 mg/dL — ABNORMAL HIGH (ref 70–99)
Potassium: 4.1 mmol/L (ref 3.5–5.1)
Sodium: 140 mmol/L (ref 135–145)
Total Bilirubin: 0.6 mg/dL (ref 0.3–1.2)
Total Protein: 6.2 g/dL — ABNORMAL LOW (ref 6.5–8.1)

## 2022-07-23 LAB — TYPE AND SCREEN
ABO/RH(D): A POS
Antibody Screen: NEGATIVE
Unit division: 0

## 2022-07-23 LAB — CBC
HCT: 29.2 % — ABNORMAL LOW (ref 36.0–46.0)
Hemoglobin: 8.7 g/dL — ABNORMAL LOW (ref 12.0–15.0)
MCH: 26.3 pg (ref 26.0–34.0)
MCHC: 29.8 g/dL — ABNORMAL LOW (ref 30.0–36.0)
MCV: 88.2 fL (ref 80.0–100.0)
Platelets: 212 10*3/uL (ref 150–400)
RBC: 3.31 MIL/uL — ABNORMAL LOW (ref 3.87–5.11)
RDW: 16 % — ABNORMAL HIGH (ref 11.5–15.5)
WBC: 5.5 10*3/uL (ref 4.0–10.5)
nRBC: 0 % (ref 0.0–0.2)

## 2022-07-23 LAB — BPAM RBC
Blood Product Expiration Date: 202311152359
ISSUE DATE / TIME: 202310221555
Unit Type and Rh: 6200

## 2022-07-23 LAB — GLUCOSE, CAPILLARY
Glucose-Capillary: 142 mg/dL — ABNORMAL HIGH (ref 70–99)
Glucose-Capillary: 190 mg/dL — ABNORMAL HIGH (ref 70–99)

## 2022-07-23 LAB — MAGNESIUM: Magnesium: 1.8 mg/dL (ref 1.7–2.4)

## 2022-07-23 LAB — PHOSPHORUS: Phosphorus: 5 mg/dL — ABNORMAL HIGH (ref 2.5–4.6)

## 2022-07-23 MED ORDER — PANTOPRAZOLE SODIUM 40 MG PO TBEC: 40.0000 mg | DELAYED_RELEASE_TABLET | Freq: Every day | ORAL | Status: DC

## 2022-07-23 MED ORDER — ALPRAZOLAM 0.5 MG PO TABS: 0.5000 mg | ORAL_TABLET | Freq: Three times a day (TID) | ORAL | 0 refills | Status: DC | PRN

## 2022-07-23 NOTE — Discharge Summary (Signed)
Physician Discharge Summary  Chelsea [name] BTD:[id number] DOB: [birth year] DOA: 07/18/2022  PCP: Rudene Anda, MD  Admit date: 07/18/2022 Discharge date: 07/23/2022 Admitted From: SNF Disposition: SNF Recommendations for Outpatient Follow-up:  Follow up with PCP in 1 to 2 weeks Check BMP and CBC in 1 to 2 weeks Recommend minimum oxygen to keep saturation above 90%.  She is at high risk for CO2 retention. Please follow up on the following pending results: None   Discharge Condition: Stable CODE STATUS: Full code  Contact information for after-discharge care     Destination     [facility] Preferred SNF .   Service: Skilled Nursing Contact information: [address] [place] [facility] course 56 year old female with history of aortic stenosis, osteoarthritis, asthma, COPD, tracheostomy, obesity hypoventilation syndrome, chronic respiratory failure with hypoxia, morbid obesity, coronary disease, chronic diastolic CHF, GERD, hepatitis C, hyperlipidemia, hypertension, hypothyroidism, type 2 diabetes who presents from [place] care for for the evaluation of low hemoglobin , melena.  Patient was also lightheaded, more tired than usual. On presentation, she was mildly hypotensive.  Hemoglobin was in the range of 6 on presentation.  Given 2 units of blood transfusion with appropriate response.  GI consulted.  EGD on EGD on 10/21 without significant finding to explain patient's blood loss.  And has not had further bleeding but hemoglobin dropped to 7.6.  Transfused additional 1 unit with appropriate response.  She also received IV iron twice.  Eliquis resumed after discussion with GI. Aspirin discontinued due to increased risk of bleeding without clear indication.  H&H remained stable, and she is discharged back to SNF.    See individual problem list below for more.   Problems addressed during this  hospitalization Principal Problem:   Upper GI bleed Active Problems:   Obstructive sleep apnea   Hypothyroidism   Chronic diastolic heart failure (HCC)   Obesity hypoventilation syndrome (HCC)   Atrial flutter (HCC)   Mononeuropathy of lower extremity   Gastro-esophageal reflux disease without esophagitis   Essential (primary) hypertension   Class 3 severe obesity with serious comorbidity and body mass index (BMI) of 50.0 to 59.9 in adult [facility])   Symptomatic anemia   Paroxysmal atrial fibrillation (HCC)   Acute blood loss anemia   Symptomatic acute blood loss anemia likely due to upper GI bleed: Anemia panel suggests severe iron deficiency.  Transfused 2 units with appropriate response but Hgb dropped down to 7.6 likely reaching equilibrium.  EGD without significant finding.  Transfused additional 1 unit with appropriate response.  Also received IV ferric gluconate 250 mg x 2. Recent Labs    07/04/22 2240 07/05/22 0411 07/06/22 0036 07/07/22 0054 07/18/22 1547 07/19/22 1062 07/20/22 0420 07/21/22 0445 07/22/22 0423 07/23/22 0359  HGB 9.4* 9.4* 8.6* 9.4* 6.7* 9.1* 8.1* 7.6* 7.7* 8.7*  -Continue p.o. Protonix. -Recheck CBC in 1 week   Morbid obesity /OHS/ OSA/chronic hypoxic respiratory failure: On BiPAP/CPAP at bedtime.  BMI 53.3.  On 4 L of oxygen at baseline but maintained good saturation even on 2 L. -Recommend minimum oxygen to keep saturation above 90% due to risk for hypercapnia  Metabolic alkalosis: Likely compensatory for chronic CO2 retention. -Recommend minimum oxygen to keep saturation above 90%   Hypothyroidism: TSH within normal. -Continue home Synthroid   Chronic diastolic CHF/moderate aortic stenosis: Difficult to assess fluid status due  to body habitus.   -Continue torsemide 40 mg twice daily.     Paroxysmal A-fib: Currently in normal sinus rhythm.   -Resumed Eliquis after discussion with GI.    Neuropathy of lower extremities/chronic pain syndrome:  Takes hydrocodone. -Continue gabapentin, Voltaren gel, Tylenol    History of GERD:  -Continue p.o. Protonix.   Essential hypertension: Normotensive.     Debility: Has been residing at nursing facility.  Unable to walk due to neuropathy.  Bedbound mostly.     Vital signs Vitals:   07/22/22 1923 07/22/22 2012 07/23/22 0409 07/23/22 0802  BP: 99/61  102/65   Pulse: 83  72   Temp: 99.1 F (37.3 C)  98 F (36.7 C)   Resp: 18  19   Height:      Weight:      SpO2: 95% 94% 94% 95%  TempSrc: Oral  Oral   BMI (Calculated):         Discharge exam  GENERAL: No apparent distress.  Nontoxic. HEENT: MMM.  Vision and hearing grossly intact.  NECK: Supple.  Difficult to assess JVD due to body habitus. RESP:  No IWOB.  Fair aeration bilaterally but limited exam due to body habitus CVS:  RRR. Heart sounds normal.  ABD/GI/GU: BS+. Abd soft, NTND but limited exam due to body habitus.  MSK/EXT:  Moves extremities.  Chronic venous insufficiency. SKIN: no apparent skin lesion or wound NEURO: Awake and alert. Oriented appropriately.  No apparent focal neuro deficit but globally weak. PSYCH: Calm. Normal affect.   Discharge Instructions Discharge Instructions     Diet - low sodium heart healthy   Complete by: As directed    Diet Carb Modified   Complete by: As directed    Increase activity slowly   Complete by: As directed       Allergies as of 07/23/2022       Reactions   Iodinated Contrast Media Anaphylaxis   Penicillins Anaphylaxis   Patient tolerates cefepime (08/2017)   Insulin Lispro Other (See Comments)   Adder per [facility] from SNF   Minoxidil    Other reaction(s): hives        Medication List     STOP taking these medications    aspirin 81 MG tablet       TAKE these medications    acetaminophen 325 MG tablet Commonly known as: TYLENOL Take 650 mg by mouth every 6 (six) hours as needed for mild pain.   albuterol (2.5 MG/3ML) 0.083% nebulizer  solution Commonly known as: PROVENTIL Take 3 mLs (2.5 mg total) by nebulization every 6 (six) hours as needed for wheezing or shortness of breath (I50.32).   ALPRAZolam 0.5 MG tablet Commonly known as: XANAX Take 1 tablet (0.5 mg total) by mouth every 8 (eight) hours as needed for anxiety.   BIOFREEZE EX Apply 1 application  topically as needed (fo pain).   budesonide 0.5 MG/2ML nebulizer solution Commonly known as: PULMICORT Inhale 0.5 mg into the lungs every 12 (twelve) hours.   Cholecalciferol 125 MCG (5000 UT) Tabs Take 1 tablet (5,000 Units total) by mouth daily.   cyanocobalamin 1000 MCG tablet Commonly known as: VITAMIN B12 Take 1 tablet (1,000 mcg total) by mouth daily.   diclofenac Sodium 1 % Gel Commonly known as: VOLTAREN Apply 4 g topically every 6 (six) hours as needed (pain).   Eliquis 5 MG Tabs tablet Generic drug: apixaban Take 5 mg by mouth 2 (two) times daily.   ferrous sulfate 324  MG Tbec Take 324 mg by mouth daily.   folic acid 1 MG tablet Commonly known as: FOLVITE Take 1 tablet (1 mg total) by mouth daily.   gabapentin 400 MG capsule Commonly known as: NEURONTIN Take 400 mg by mouth 3 (three) times daily.   GLUCAGON HCL IJ Inject 1 mg into the skin daily as needed (Hypoglycemia).   insulin aspart 100 UNIT/ML injection Commonly known as: novoLOG insulin aspart (novoLOG) injection 0-24 Units 0-24 Units, Subcutaneous, 3 times daily with meals, CBG < 70: Implement Hypoglycemia measures, call MD CBG < 120 (dose in units): 0 CBG 120 - 160 (dose in units): 2 CBG 161 - 200 (dose in units): 4 CBG 201 - 250 (dose in units): 8 CBG 251 - 300 (dose in units): 12 CBG 301 - 350 (dose in units): 16 CBG 351 - 450 (dose in units): 20 CBG > 450: 24 units and obtain STAT glucose and notify MD   levothyroxine 150 MCG tablet Commonly known as: SYNTHROID Take 1 tablet (150 mcg total) by mouth daily at 6 (six) AM.   pantoprazole 40 MG tablet Commonly known  as: PROTONIX Take 1 tablet (40 mg total) by mouth daily. What changed: when to take this   polyethylene glycol 17 g packet Commonly known as: MIRALAX / GLYCOLAX Take 17 g by mouth daily.   potassium chloride SA 20 MEQ tablet Commonly known as: KLOR-CON M Take 1 tablet (20 mEq total) by mouth daily.   senna-docusate 8.6-50 MG tablet Commonly known as: Senokot-S Take 2 tablets by mouth 2 (two) times daily. What changed: how much to take   torsemide 20 MG tablet Commonly known as: DEMADEX Take 40 mg by mouth daily. What changed: Another medication with the same name was removed. Continue taking this medication, and follow the directions you see here.        Consultations: Gastroenterology  Procedures/Studies: EGD on 10/21 - Z-line irregular, 36 cm from the incisors. Biopsied. - Small hiatal hernia. - No gross lesions in the stomach. - Normal duodenal bulb, first portion of the duodenum and second portion of the duodenum.   DG CHEST PORT 1 VIEW  Result Date: 07/19/2022 CLINICAL DATA:  Cough history of asthma EXAM: PORTABLE CHEST 1 VIEW COMPARISON:  07/04/2022 FINDINGS: Cardiomegaly with vascular congestion. No consolidation, pleural effusion, or pneumothorax. Streaky atelectasis at the right base. Aortic atherosclerosis IMPRESSION: Cardiomegaly with vascular congestion. Streaky atelectasis right base Electronically Signed   By: Donavan Foil M.D.   On: 07/19/2022 15:34   DG Knee Right Port  Result Date: 07/04/2022 CLINICAL DATA:  Right knee injury yesterday, limited range of motion EXAM: PORTABLE RIGHT KNEE - 1-2 VIEW COMPARISON:  07/03/2021 FINDINGS: Frontal, bilateral oblique, and lateral views of the right knee are obtained. No acute fracture, subluxation, or dislocation. 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 No joint effusion. There is diffuse subcutaneous edema. IMPRESSION: 1. Stable severe 3 compartmental osteoarthritis. 2. No acute bony  abnormality. Electronically Signed   By: Randa Ngo M.D.   On: 07/04/2022 18:58   DG Chest Port 1 View  Result Date: 07/04/2022 CLINICAL DATA:  Hypoxia, history of pneumonia, short of breath EXAM: PORTABLE CHEST 1 VIEW COMPARISON:  04/18/2022 FINDINGS: Single frontal view of the chest demonstrates an enlarged cardiac silhouette. There is increased central vascular congestion with bilateral perihilar airspace disease. No large effusion or pneumothorax. No acute bony abnormalities. IMPRESSION: 1. Constellation of findings most consistent with congestive heart failure. Superimposed pneumonia would be  difficult to exclude. Electronically Signed   By: Randa Ngo M.D.   On: 07/04/2022 18:57       The results of significant diagnostics from this hospitalization (including imaging, microbiology, ancillary and laboratory) are listed below for reference.     Microbiology: No results found for this or any previous visit (from the past 240 hour(s)).   Labs:  CBC: Recent Labs  Lab 07/19/22 0952 07/20/22 0420 07/21/22 0445 07/22/22 0423 07/23/22 0359  WBC 5.9 5.9 4.9 5.4 5.5  HGB 9.1* 8.1* 7.6* 7.7* 8.7*  HCT 31.0* 28.2* 26.6* 26.8* 29.2*  MCV 90.9 91.9 90.5 90.2 88.2  PLT 257 234 238 224 212   BMP &GFR Recent Labs  Lab 07/18/22 1547 07/19/22 0952 07/22/22 0423 07/23/22 0359  NA 140 142 140 140  K 3.8 3.8 3.8 4.1  CL 94* 96* 93* 93*  CO2 38* 38* 39* 39*  GLUCOSE 191* 140* 155* 132*  BUN 25* 21* 18 23*  CREATININE 0.72 0.59 0.76 0.72  CALCIUM 8.4* 8.6* 8.2* 8.2*  MG  --   --   --  1.8  PHOS  --   --   --  5.0*   Estimated Creatinine Clearance: 110.8 mL/min (by C-G formula based on SCr of 0.72 mg/dL). Liver & Pancreas: Recent Labs  Lab 07/18/22 1547 07/19/22 0952 07/23/22 0359  AST 14* 14* 13*  ALT '10 10 10  '$ ALKPHOS 70 79 63  BILITOT 0.4 0.7 0.6  PROT 6.8 7.2 6.2*  ALBUMIN 3.3* 3.3* 3.0*   No results for input(s): "LIPASE", "AMYLASE" in the last 168 hours. No  results for input(s): "AMMONIA" in the last 168 hours. Diabetic: No results for input(s): "HGBA1C" in the last 72 hours. Recent Labs  Lab 07/22/22 0745 07/22/22 1134 07/22/22 1628 07/22/22 1940 07/23/22 0714  GLUCAP 120* 237* 116* 173* 142*   Cardiac Enzymes: No results for input(s): "CKTOTAL", "CKMB", "CKMBINDEX", "TROPONINI" in the last 168 hours. No results for input(s): "PROBNP" in the last 8760 hours. Coagulation Profile: Recent Labs  Lab 07/18/22 1547  INR 1.3*   Thyroid Function Tests: No results for input(s): "TSH", "T4TOTAL", "FREET4", "T3FREE", "THYROIDAB" in the last 72 hours. Lipid Profile: No results for input(s): "CHOL", "HDL", "LDLCALC", "TRIG", "CHOLHDL", "LDLDIRECT" in the last 72 hours. Anemia Panel: No results for input(s): "VITAMINB12", "FOLATE", "FERRITIN", "TIBC", "IRON", "RETICCTPCT" in the last 72 hours. Urine analysis:    Component Value Date/Time   COLORURINE STRAW (A) 07/04/2022 2300   APPEARANCEUR CLEAR 07/04/2022 2300   LABSPEC 1.008 07/04/2022 2300   PHURINE 6.0 07/04/2022 2300   GLUCOSEU NEGATIVE 07/04/2022 2300   HGBUR NEGATIVE 07/04/2022 2300   BILIRUBINUR NEGATIVE 07/04/2022 2300   KETONESUR NEGATIVE 07/04/2022 2300   PROTEINUR NEGATIVE 07/04/2022 2300   UROBILINOGEN 0.2 12/08/2010 0545   NITRITE NEGATIVE 07/04/2022 2300   LEUKOCYTESUR NEGATIVE 07/04/2022 2300   Sepsis Labs: Invalid input(s): "PROCALCITONIN", "LACTICIDVEN"   SIGNED:  Mercy Riding, MD  [facility] 07/23/2022, 11:08 AM

## 2022-07-23 NOTE — Progress Notes (Signed)
Report called to nurse [facility] at [facility]. Awaiting PTAR at this time.

## 2022-07-23 NOTE — TOC Transition Note (Signed)
Transition of Care [facility]) - CM/SW Discharge Note   Patient Details  Name: Chelsea [name] MRN: [id number] Date of Birth: Dec 20, 1965  [facility] CM/SW Contact:  Roseanne Kaufman, RN Phone Number: 07/23/2022, 2:21 PM   Clinical Narrative:  Discharge and SNF report sent to [facility] in [place]. Spoke with Kia with [facility], Report can be called to [phone], room# 101B. Notified RN, MD [name] has been called. No additional TOC needs at this time.     Final next level of care: [place] Barriers to Discharge: No Barriers Identified   Patient Goals and CMS Choice Patient states their goals for this hospitalization and ongoing recovery are:: return to [facility] care for short term rehab CMS Medicare.gov Compare Post Acute Care list provided to:: Patient Choice offered to / list presented to : Patient  Discharge Placement                       Discharge Plan and Services In-house Referral: NA Discharge Planning Services: CM Consult Post Acute Care Choice: [place]          DME Arranged: N/A DME Agency: NA       HH Arranged: NA HH Agency: NA        Social Determinants of Health (SDOH) Interventions     Readmission Risk Interventions    07/23/2022    2:19 PM 04/09/2022    4:17 PM  Readmission Risk Prevention Plan  Transportation Screening Complete Complete  PCP or Specialist Appt within 3-5 Days  Complete  HRI or [place]  Complete  Social Work Consult for [place] Planning/Counseling  Complete  Palliative Care Screening  Not Applicable  Medication Review [employer]) Complete Referral to Pharmacy  PCP or Specialist appointment within 3-5 days of discharge Complete   HRI or Home Care Consult Complete   SW Recovery Care/Counseling Consult Complete   Palliative Care Screening Not [place] Complete

## 2022-07-24 LAB — SURGICAL PATHOLOGY

## 2022-07-27 ENCOUNTER — Other Ambulatory Visit: Payer: Self-pay | Admitting: Physical Medicine and Rehabilitation

## 2022-07-27 DIAGNOSIS — G8929 Other chronic pain: Secondary | ICD-10-CM

## 2022-07-29 ENCOUNTER — Inpatient Hospital Stay (HOSPITAL_COMMUNITY)
Admission: EM | Admit: 2022-07-29 | Discharge: 2022-08-03 | DRG: 291 | Disposition: A | Discharge status: Skilled Nursing Facility | Payer: Medicare Other | Source: Skilled Nursing Facility | Attending: Internal Medicine | Admitting: Internal Medicine

## 2022-07-29 ENCOUNTER — Other Ambulatory Visit: Payer: Self-pay

## 2022-07-29 ENCOUNTER — Emergency Department (HOSPITAL_COMMUNITY): Payer: Medicare Other

## 2022-07-29 ENCOUNTER — Encounter (HOSPITAL_COMMUNITY): Payer: Self-pay

## 2022-07-29 DIAGNOSIS — R110 Nausea: Secondary | ICD-10-CM | POA: Diagnosis present

## 2022-07-29 DIAGNOSIS — M109 Gout, unspecified: Secondary | ICD-10-CM | POA: Diagnosis present

## 2022-07-29 DIAGNOSIS — K2270 Barrett's esophagus without dysplasia: Secondary | ICD-10-CM | POA: Diagnosis present

## 2022-07-29 DIAGNOSIS — B182 Chronic viral hepatitis C: Secondary | ICD-10-CM | POA: Diagnosis present

## 2022-07-29 DIAGNOSIS — G894 Chronic pain syndrome: Secondary | ICD-10-CM | POA: Diagnosis present

## 2022-07-29 DIAGNOSIS — E1142 Type 2 diabetes mellitus with diabetic polyneuropathy: Secondary | ICD-10-CM | POA: Diagnosis present

## 2022-07-29 DIAGNOSIS — I5033 Acute on chronic diastolic (congestive) heart failure: Secondary | ICD-10-CM | POA: Diagnosis present

## 2022-07-29 DIAGNOSIS — J9811 Atelectasis: Secondary | ICD-10-CM | POA: Diagnosis present

## 2022-07-29 DIAGNOSIS — Z880 Allergy status to penicillin: Secondary | ICD-10-CM

## 2022-07-29 DIAGNOSIS — Z91041 Radiographic dye allergy status: Secondary | ICD-10-CM

## 2022-07-29 DIAGNOSIS — I4891 Unspecified atrial fibrillation: Secondary | ICD-10-CM | POA: Diagnosis present

## 2022-07-29 DIAGNOSIS — Z79890 Hormone replacement therapy (postmenopausal): Secondary | ICD-10-CM

## 2022-07-29 DIAGNOSIS — Z79899 Other long term (current) drug therapy: Secondary | ICD-10-CM

## 2022-07-29 DIAGNOSIS — M1990 Unspecified osteoarthritis, unspecified site: Secondary | ICD-10-CM | POA: Diagnosis present

## 2022-07-29 DIAGNOSIS — M545 Low back pain, unspecified: Secondary | ICD-10-CM | POA: Diagnosis present

## 2022-07-29 DIAGNOSIS — Z8249 Family history of ischemic heart disease and other diseases of the circulatory system: Secondary | ICD-10-CM

## 2022-07-29 DIAGNOSIS — E039 Hypothyroidism, unspecified: Secondary | ICD-10-CM | POA: Diagnosis present

## 2022-07-29 DIAGNOSIS — K219 Gastro-esophageal reflux disease without esophagitis: Secondary | ICD-10-CM | POA: Diagnosis present

## 2022-07-29 DIAGNOSIS — Z7401 Bed confinement status: Secondary | ICD-10-CM

## 2022-07-29 DIAGNOSIS — Z7951 Long term (current) use of inhaled steroids: Secondary | ICD-10-CM

## 2022-07-29 DIAGNOSIS — J9621 Acute and chronic respiratory failure with hypoxia: Secondary | ICD-10-CM | POA: Diagnosis not present

## 2022-07-29 DIAGNOSIS — R5381 Other malaise: Secondary | ICD-10-CM | POA: Diagnosis present

## 2022-07-29 DIAGNOSIS — Z91199 Patient's noncompliance with other medical treatment and regimen due to unspecified reason: Secondary | ICD-10-CM

## 2022-07-29 DIAGNOSIS — J984 Other disorders of lung: Secondary | ICD-10-CM | POA: Diagnosis present

## 2022-07-29 DIAGNOSIS — R197 Diarrhea, unspecified: Secondary | ICD-10-CM | POA: Diagnosis present

## 2022-07-29 DIAGNOSIS — I2510 Atherosclerotic heart disease of native coronary artery without angina pectoris: Secondary | ICD-10-CM | POA: Diagnosis present

## 2022-07-29 DIAGNOSIS — E662 Morbid (severe) obesity with alveolar hypoventilation: Secondary | ICD-10-CM | POA: Diagnosis present

## 2022-07-29 DIAGNOSIS — Z8541 Personal history of malignant neoplasm of cervix uteri: Secondary | ICD-10-CM

## 2022-07-29 DIAGNOSIS — J441 Chronic obstructive pulmonary disease with (acute) exacerbation: Secondary | ICD-10-CM | POA: Diagnosis present

## 2022-07-29 DIAGNOSIS — Z888 Allergy status to other drugs, medicaments and biological substances status: Secondary | ICD-10-CM

## 2022-07-29 DIAGNOSIS — I890 Lymphedema, not elsewhere classified: Secondary | ICD-10-CM | POA: Diagnosis present

## 2022-07-29 DIAGNOSIS — I110 Hypertensive heart disease with heart failure: Principal | ICD-10-CM | POA: Diagnosis present

## 2022-07-29 DIAGNOSIS — I5042 Chronic combined systolic (congestive) and diastolic (congestive) heart failure: Secondary | ICD-10-CM | POA: Diagnosis present

## 2022-07-29 DIAGNOSIS — D638 Anemia in other chronic diseases classified elsewhere: Secondary | ICD-10-CM | POA: Diagnosis present

## 2022-07-29 DIAGNOSIS — G2581 Restless legs syndrome: Secondary | ICD-10-CM | POA: Diagnosis present

## 2022-07-29 DIAGNOSIS — J449 Chronic obstructive pulmonary disease, unspecified: Secondary | ICD-10-CM | POA: Diagnosis present

## 2022-07-29 DIAGNOSIS — I509 Heart failure, unspecified: Secondary | ICD-10-CM

## 2022-07-29 DIAGNOSIS — Z7901 Long term (current) use of anticoagulants: Secondary | ICD-10-CM

## 2022-07-29 DIAGNOSIS — I4892 Unspecified atrial flutter: Secondary | ICD-10-CM | POA: Diagnosis not present

## 2022-07-29 DIAGNOSIS — G43909 Migraine, unspecified, not intractable, without status migrainosus: Secondary | ICD-10-CM | POA: Diagnosis present

## 2022-07-29 DIAGNOSIS — I350 Nonrheumatic aortic (valve) stenosis: Secondary | ICD-10-CM | POA: Diagnosis present

## 2022-07-29 DIAGNOSIS — J4489 Other specified chronic obstructive pulmonary disease: Secondary | ICD-10-CM | POA: Diagnosis present

## 2022-07-29 DIAGNOSIS — I10 Essential (primary) hypertension: Secondary | ICD-10-CM | POA: Diagnosis present

## 2022-07-29 DIAGNOSIS — Z6841 Body Mass Index (BMI) 40.0 and over, adult: Secondary | ICD-10-CM

## 2022-07-29 DIAGNOSIS — F32A Depression, unspecified: Secondary | ICD-10-CM | POA: Diagnosis present

## 2022-07-29 DIAGNOSIS — Z794 Long term (current) use of insulin: Secondary | ICD-10-CM

## 2022-07-29 DIAGNOSIS — E785 Hyperlipidemia, unspecified: Secondary | ICD-10-CM | POA: Diagnosis present

## 2022-07-29 LAB — COMPREHENSIVE METABOLIC PANEL
ALT: 9 U/L (ref 0–44)
AST: 16 U/L (ref 15–41)
Albumin: 2.9 g/dL — ABNORMAL LOW (ref 3.5–5.0)
Alkaline Phosphatase: 70 U/L (ref 38–126)
Anion gap: 8 (ref 5–15)
BUN: 11 mg/dL (ref 6–20)
CO2: 38 mmol/L — ABNORMAL HIGH (ref 22–32)
Calcium: 8.2 mg/dL — ABNORMAL LOW (ref 8.9–10.3)
Chloride: 96 mmol/L — ABNORMAL LOW (ref 98–111)
Creatinine, Ser: 0.68 mg/dL (ref 0.44–1.00)
GFR, Estimated: >60 mL/min (ref >60)
Glucose, Bld: 163 mg/dL — ABNORMAL HIGH (ref 70–99)
Potassium: 3.8 mmol/L (ref 3.5–5.1)
Sodium: 142 mmol/L (ref 135–145)
Total Bilirubin: 0.3 mg/dL (ref 0.3–1.2)
Total Protein: 6 g/dL — ABNORMAL LOW (ref 6.5–8.1)

## 2022-07-29 LAB — I-STAT VENOUS BLOOD GAS, ED
Acid-Base Excess: 13 mmol/L — ABNORMAL HIGH (ref 0.0–2.0)
Bicarbonate: 37.3 mmol/L — ABNORMAL HIGH (ref 20.0–28.0)
Calcium, Ion: 0.96 mmol/L — ABNORMAL LOW (ref 1.15–1.40)
HCT: 29 % — ABNORMAL LOW (ref 36.0–46.0)
Hemoglobin: 9.9 g/dL — ABNORMAL LOW (ref 12.0–15.0)
O2 Saturation: 97 %
Potassium: 3.8 mmol/L (ref 3.5–5.1)
Sodium: 139 mmol/L (ref 135–145)
TCO2: 39 mmol/L — ABNORMAL HIGH (ref 22–32)
pCO2, Ven: 44 mmHg (ref 44–60)
pH, Ven: 7.536 — ABNORMAL HIGH (ref 7.25–7.43)
pO2, Ven: 77 mmHg — ABNORMAL HIGH (ref 32–45)

## 2022-07-29 LAB — CBC WITH DIFFERENTIAL/PLATELET
Abs Immature Granulocytes: 0.02 10*3/uL (ref 0.00–0.07)
Basophils Absolute: 0 10*3/uL (ref 0.0–0.1)
Basophils Relative: 1 %
Eosinophils Absolute: 0.1 10*3/uL (ref 0.0–0.5)
Eosinophils Relative: 2 %
HCT: 30.5 % — ABNORMAL LOW (ref 36.0–46.0)
Hemoglobin: 8.8 g/dL — ABNORMAL LOW (ref 12.0–15.0)
Immature Granulocytes: 0 %
Lymphocytes Relative: 36 %
Lymphs Abs: 1.9 10*3/uL (ref 0.7–4.0)
MCH: 26.7 pg (ref 26.0–34.0)
MCHC: 28.9 g/dL — ABNORMAL LOW (ref 30.0–36.0)
MCV: 92.7 fL (ref 80.0–100.0)
Monocytes Absolute: 0.4 10*3/uL (ref 0.1–1.0)
Monocytes Relative: 8 %
Neutro Abs: 2.9 10*3/uL (ref 1.7–7.7)
Neutrophils Relative %: 53 %
Platelets: 212 10*3/uL (ref 150–400)
RBC: 3.29 MIL/uL — ABNORMAL LOW (ref 3.87–5.11)
RDW: 17.9 % — ABNORMAL HIGH (ref 11.5–15.5)
WBC: 5.4 10*3/uL (ref 4.0–10.5)
nRBC: 0 % (ref 0.0–0.2)

## 2022-07-29 LAB — I-STAT CHEM 8, ED
BUN: 15 mg/dL (ref 6–20)
Calcium, Ion: 0.96 mmol/L — ABNORMAL LOW (ref 1.15–1.40)
Chloride: 94 mmol/L — ABNORMAL LOW (ref 98–111)
Creatinine, Ser: 0.7 mg/dL (ref 0.44–1.00)
Glucose, Bld: 168 mg/dL — ABNORMAL HIGH (ref 70–99)
HCT: 30 % — ABNORMAL LOW (ref 36.0–46.0)
Hemoglobin: 10.2 g/dL — ABNORMAL LOW (ref 12.0–15.0)
Potassium: 3.9 mmol/L (ref 3.5–5.1)
Sodium: 139 mmol/L (ref 135–145)
TCO2: 35 mmol/L — ABNORMAL HIGH (ref 22–32)

## 2022-07-29 LAB — BRAIN NATRIURETIC PEPTIDE: B Natriuretic Peptide: 64.3 pg/mL (ref 0.0–100.0)

## 2022-07-29 LAB — MAGNESIUM: Magnesium: 1.5 mg/dL — ABNORMAL LOW (ref 1.7–2.4)

## 2022-07-29 LAB — D-DIMER, QUANTITATIVE: D-Dimer, Quant: 1.06 ug/mL-FEU — ABNORMAL HIGH (ref 0.00–0.50)

## 2022-07-29 MED ORDER — MAGNESIUM SULFATE 2 GM/50ML IV SOLN
2.0000 g | Freq: Once | INTRAVENOUS | Status: AC
  Administered 2022-07-30: 2 g via INTRAVENOUS
  Filled 2022-07-29: qty 50

## 2022-07-29 MED ORDER — POTASSIUM CHLORIDE CRYS ER 20 MEQ PO TBCR
40.0000 meq | EXTENDED_RELEASE_TABLET | Freq: Once | ORAL | Status: AC
  Administered 2022-07-30: 40 meq via ORAL
  Filled 2022-07-29: qty 2

## 2022-07-29 MED ORDER — FUROSEMIDE 10 MG/ML IJ SOLN
60.0000 mg | Freq: Once | INTRAMUSCULAR | Status: DC
  Filled 2022-07-29: qty 6

## 2022-07-29 NOTE — H&P (Incomplete)
History and Physical    VALARIE FARACE YKD:[id number] DOB: 09/25/1966 DOA: 07/29/2022  PCP: Rudene Anda, MD  Patient coming from: Rehab facility  I have personally briefly reviewed patient's old medical records in [place]  Chief Complaint: increase O2 requirement / pulmonary edema on cxr  HPI: Chelsea [name] is a 56 y.o. female with medical history aortic stenosis, osteoarthritis, asthma, COPD/asthma, hx of tracheostomy, obesity hypoventilation syndrome/OSA on Cpap, chronic respiratory failure with hypoxia, morbid obesity, coronary disease, chronic diastolic CHF, GERD, hepatitis C, hyperlipidemia, hypertension, hypothyroidism, type 2 diabetes who has interim history of recent hospitalization 07/18/22-07/23/22 with discharge diagnosis of Upper GI bleed for which she was treated with 3 units of PRBC with appropriate response in hgb, with d/c hgb of 8.7, Egd at that time noted no active bleeding. Patient discharge to rehab no return from rehab after patient was note to have increase O2 requirement from baseline 3- to 6 L prompting cxr which noted pulmonary edema.  Patient during this time noted no complaints. Patient referred to ED for further evaluation and treatment.  ED Course:    Review of Systems: As per HPI otherwise 10 point review of systems negative.   Past Medical History:  Diagnosis Date  . Aortic stenosis    Echocardiogram 02/21/11:  Mean gradient 23 mm of mercury; peak gradient 36;; Turbulence noted in the area of the aortic isthmus with peak gradient 23 mmHg-consider MRI to assess for coarctation;    TEE: 04/02/11:  EF 55-60%, mod BAE, trileaflet AV with mild AS (pk and mean 25 and 16), mild AI, mild MR, mild TR, atrial septal aneurysm, no evidence of corarctation  . Arthritis    "qwhere" (12/05/2017)  . Asthma   . Cervical cancer ([place]) 2006  . CHF (congestive heart failure) ([place])   . Chronic lower back pain   . Complication of anesthesia    "I have a hard time  waking up from under it" (12/05/2017)  . COPD (chronic obstructive pulmonary disease) ([place])    "lung dr said I don't have this" (12/05/2017)  . Coronary artery disease   . Diastolic congestive heart failure ([place])    Echo 02/21/11: EF 55-60%, moderate LVH, grade 2 diastolic dysfunction, mild to moderate aortic stenosis with a mean gradient 23 mm of mercury  . GERD (gastroesophageal reflux disease)   . Heart murmur   . Hepatitis C   . History of gout X 1  . Hyperlipidemia   . Hypertension   . Hypotension arterial 04/07/2022  . Hypothyroid   . IBS (irritable bowel syndrome)   . Migraine    "monthly" (12/05/2017)  . Obesity hypoventilation syndrome ([place])   . On home oxygen therapy    "2L all the time" (12/05/2017)  . OSA treated with BiPAP    "have CPAP at home too; wearing BiPAP right now" (12/05/2017)  . Pneumonia    "several times" (12/05/2017)  . Type II diabetes mellitus ([place])     Past Surgical History:  Procedure Laterality Date  . ABDOMINAL SURGERY  2006 X 2   "TAH incision was infected"  . BIOPSY  07/21/2022   Procedure: BIOPSY;  Surgeon: Otis Brace, MD;  Location: [facility];  Service: Gastroenterology;;  . CHOLECYSTECTOMY OPEN    . ESOPHAGOGASTRODUODENOSCOPY N/A 07/21/2022   Procedure: ESOPHAGOGASTRODUODENOSCOPY (EGD);  Surgeon: Otis Brace, MD;  Location: [facility];  Service: Gastroenterology;  Laterality: N/A;  . TONSILLECTOMY    . TOTAL ABDOMINAL HYSTERECTOMY  2006  . TRACHEOSTOMY  REVISION N/A 04/09/2022   Procedure: CONTROL OF BLEEDING,TRACHEOSTOMY SITE;  Surgeon: Melida Quitter, MD;  Location: [facility];  Service: ENT;  Laterality: N/A;     reports that she has never smoked. She has never used smokeless tobacco. She reports that she does not currently use alcohol. She reports that she does not use drugs.  Allergies  Allergen Reactions  . Iodinated Contrast Media Anaphylaxis  . Penicillins Anaphylaxis    Patient tolerates cefepime (08/2017)  . Insulin Lispro  Other (See Comments)    Adder per [facility] from SNF  . Minoxidil     Other reaction(s): hives    Family History  Problem Relation Age of Onset  . Heart disease Father    *** Prior to Admission medications   Medication Sig Start Date End Date Taking? Authorizing Provider  acetaminophen (TYLENOL) 325 MG tablet Take 650 mg by mouth every 6 (six) hours as needed for mild pain.    [provider]  albuterol (PROVENTIL) (2.5 MG/3ML) 0.083% nebulizer solution Take 3 mLs (2.5 mg total) by nebulization every 6 (six) hours as needed for wheezing or shortness of breath (I50.32). 07/04/16   [name], MD  ALPRAZolam Duanne Moron) 0.5 MG tablet Take 1 tablet (0.5 mg total) by mouth every 8 (eight) hours as needed for anxiety. 07/23/22   Mercy Riding, MD  budesonide (PULMICORT) 0.5 MG/2ML nebulizer solution Inhale 0.5 mg into the lungs every 12 (twelve) hours.    [provider]  Cholecalciferol 125 MCG (5000 UT) TABS Take 1 tablet (5,000 Units total) by mouth daily. Patient not taking: Reported on 07/18/2022 07/10/22   [name], MD  cyanocobalamin (VITAMIN B12) 1000 MCG tablet Take 1 tablet (1,000 mcg total) by mouth daily. 07/10/22   [name], [name], MD  diclofenac Sodium (VOLTAREN) 1 % GEL Apply 4 g topically every 6 (six) hours as needed (pain).    [provider]  ELIQUIS 5 MG TABS tablet Take 5 mg by mouth 2 (two) times daily. 04/12/21   [provider]  ferrous sulfate 324 MG TBEC Take 324 mg by mouth daily.    [provider]  folic acid (FOLVITE) 1 MG tablet Take 1 tablet (1 mg total) by mouth daily. 07/10/22   [name], [name], MD  gabapentin (NEURONTIN) 400 MG capsule Take 400 mg by mouth 3 (three) times daily.    [provider]  GLUCAGON HCL IJ Inject 1 mg into the skin daily as needed (Hypoglycemia).    [provider]  insulin aspart (NOVOLOG) 100 UNIT/ML injection insulin aspart (novoLOG) injection 0-24 Units 0-24  Units, Subcutaneous, 3 times daily with meals, CBG < 70: Implement Hypoglycemia measures, call MD CBG < 120 (dose in units): 0 CBG 120 - 160 (dose in units): 2 CBG 161 - 200 (dose in units): 4 CBG 201 - 250 (dose in units): 8 CBG 251 - 300 (dose in units): 12 CBG 301 - 350 (dose in units): 16 CBG 351 - 450 (dose in units): 20 CBG > 450: 24 units and obtain STAT glucose and notify MD 07/10/22   [name], MD  levothyroxine (SYNTHROID) 150 MCG tablet Take 1 tablet (150 mcg total) by mouth daily at 6 (six) AM. 07/10/22   [name], [name], MD  Menthol, Topical Analgesic, (BIOFREEZE EX) Apply 1 application  topically as needed (fo pain).    [provider]  pantoprazole (PROTONIX) 40 MG tablet Take 1 tablet (40 mg total) by mouth daily.  07/23/22   Mercy Riding, MD  polyethylene glycol (MIRALAX / GLYCOLAX) 17 g packet Take 17 g by mouth daily. 07/10/22   [name], [name], MD  potassium chloride SA (KLOR-CON M) 20 MEQ tablet Take 1 tablet (20 mEq total) by mouth daily. 07/10/22   [name], [name], MD  senna-docusate (SENOKOT-S) 8.6-50 MG tablet Take 2 tablets by mouth 2 (two) times daily. Patient taking differently: Take 1 tablet by mouth 2 (two) times daily. 07/10/22   [name], [name], MD  torsemide (DEMADEX) 20 MG tablet Take 40 mg by mouth daily.    [provider]    Physical Exam: Vitals:   07/29/22 2013 07/29/22 2019 07/29/22 2020 07/29/22 2307  BP:    (!) 90/50  Pulse:    76  Resp:    20  Temp:      TempSrc:      SpO2: 91% 92%  93%  Weight:   (!) 145.2 kg   Height:   '5\' 5"'$  (1.651 m)     Constitutional: NAD, calm, comfortable Vitals:   07/29/22 2013 07/29/22 2019 07/29/22 2020 07/29/22 2307  BP:    (!) 90/50  Pulse:    76  Resp:    20  Temp:      TempSrc:      SpO2: 91% 92%  93%  Weight:   (!) 145.2 kg   Height:   '5\' 5"'$  (1.651 m)    Eyes: PERRL, lids and conjunctivae normal ENMT: Mucous membranes are moist. Posterior pharynx clear of  any exudate or lesions.Normal dentition.  Neck: normal, supple, no masses, no thyromegaly Respiratory: clear to auscultation bilaterally, no wheezing, no crackles. Normal respiratory effort. No accessory muscle use.  Cardiovascular: Regular rate and rhythm, no murmurs / rubs / gallops. No extremity edema. 2+ pedal pulses. No carotid bruits.  Abdomen: no tenderness, no masses palpated. No hepatosplenomegaly. Bowel sounds positive.  Musculoskeletal: no clubbing / cyanosis. No joint deformity upper and lower extremities. Good ROM, no contractures. Normal muscle tone.  Skin: no rashes, lesions, ulcers. No induration Neurologic: CN 2-12 grossly intact. Sensation intact, DTR normal. Strength 5/5 in all 4.  Psychiatric: Normal judgment and insight. Alert and oriented x 3. Normal mood.    Labs on Admission: I have personally reviewed following labs and imaging studies  CBC: Recent Labs  Lab 07/23/22 0359 07/29/22 2122 07/29/22 2129  WBC 5.5 5.4  --   NEUTROABS  --  2.9  --   HGB 8.7* 8.8* 10.2*  9.9*  HCT 29.2* 30.5* 30.0*  29.0*  MCV 88.2 92.7  --   PLT 212 212  --    Basic Metabolic Panel: Recent Labs  Lab 07/23/22 0359 07/29/22 2122 07/29/22 2129  NA 140 142 139  139  K 4.1 3.8 3.9  3.8  CL 93* 96* 94*  CO2 39* 38*  --   GLUCOSE 132* 163* 168*  BUN 23* 11 15  CREATININE 0.72 0.68 0.70  CALCIUM 8.2* 8.2*  --   MG 1.8 1.5*  --   PHOS 5.0*  --   --    GFR: Estimated Creatinine Clearance: 111.7 mL/min (by C-G formula based on SCr of 0.7 mg/dL). Liver Function Tests: Recent Labs  Lab 07/23/22 0359 07/29/22 2122  AST 13* 16  ALT 10 9  ALKPHOS 63 70  BILITOT 0.6 0.3  PROT 6.2* 6.0*  ALBUMIN 3.0* 2.9*   No results for input(s): "LIPASE", "AMYLASE" in the last 168 hours. No results  for input(s): "AMMONIA" in the last 168 hours. Coagulation Profile: No results for input(s): "INR", "PROTIME" in the last 168 hours. Cardiac Enzymes: No results for input(s): "CKTOTAL",  "CKMB", "CKMBINDEX", "TROPONINI" in the last 168 hours. BNP (last 3 results) No results for input(s): "PROBNP" in the last 8760 hours. HbA1C: No results for input(s): "HGBA1C" in the last 72 hours. CBG: Recent Labs  Lab 07/23/22 0714 07/23/22 1125  GLUCAP 142* 190*   Lipid Profile: No results for input(s): "CHOL", "HDL", "LDLCALC", "TRIG", "CHOLHDL", "LDLDIRECT" in the last 72 hours. Thyroid Function Tests: No results for input(s): "TSH", "T4TOTAL", "FREET4", "T3FREE", "THYROIDAB" in the last 72 hours. Anemia Panel: No results for input(s): "VITAMINB12", "FOLATE", "FERRITIN", "TIBC", "IRON", "RETICCTPCT" in the last 72 hours. Urine analysis:    Component Value Date/Time   COLORURINE STRAW (A) 07/04/2022 2300   APPEARANCEUR CLEAR 07/04/2022 2300   LABSPEC 1.008 07/04/2022 2300   PHURINE 6.0 07/04/2022 2300   GLUCOSEU NEGATIVE 07/04/2022 2300   HGBUR NEGATIVE 07/04/2022 2300   BILIRUBINUR NEGATIVE 07/04/2022 2300   KETONESUR NEGATIVE 07/04/2022 2300   PROTEINUR NEGATIVE 07/04/2022 2300   UROBILINOGEN 0.2 12/08/2010 0545   NITRITE NEGATIVE 07/04/2022 2300   LEUKOCYTESUR NEGATIVE 07/04/2022 2300    Radiological Exams on Admission: DG Chest 1 View  Result Date: 07/29/2022 CLINICAL DATA:  Abnormal chest radiograph. EXAM: CHEST  1 VIEW COMPARISON:  Chest radiograph dated 07/19/2022. FINDINGS: Cardiomegaly with vascular congestion. No focal consolidation, pleural effusion, or pneumothorax. No acute osseous pathology. IMPRESSION: Cardiomegaly with vascular congestion. Electronically Signed   By: Anner Crete M.D.   On: 07/29/2022 20:55    EKG: Independently reviewed. ***  Assessment/Plan Active Problems:   * No active hospital problems. *   *** Morbid obesity /OHS/ OSA/chronic hypoxic respiratory failure: On BiPAP/CPAP at bedtime.  BMI 53.3.  On 4 L of oxygen at baseline but maintained good saturation even on 2 L. -Recommend minimum oxygen to keep saturation above 90% due  to risk for hypercapnia   Metabolic alkalosis: Likely compensatory for chronic CO2 retention. -Recommend minimum oxygen to keep saturation above 90%   Hypothyroidism: TSH within normal. -Continue home Synthroid   Chronic diastolic CHF/moderate aortic stenosis: Difficult to assess fluid status due to body habitus.   -Continue torsemide 40 mg twice daily.     Paroxysmal A-fib: Currently in normal sinus rhythm.   -Resumed Eliquis after discussion with GI.    Neuropathy of lower extremities/chronic pain syndrome: Takes hydrocodone. -Continue gabapentin, Voltaren gel, Tylenol    History of GERD:  -Continue p.o. Protonix.   Essential hypertension: Normotensive.     Debility: Has been residing at nursing facility.  Unable to walk due to neuropathy.  Bedbound mostly DVT prophylaxis: *** (Lovenox/Heparin/SCD's/anticoagulated/None (if comfort care) Code Status: *** (Full/Partial (specify details) Family Communication: *** (Specify name, relationship. Do not write "discussed with patient". Specify tel # if discussed over the phone) Disposition Plan: *** (specify when and where you expect patient to be discharged) Consults called: *** (with names) Admission status: *** (inpatient / obs / tele / medical floor / SDU)   Clance Boll MD [facility] Pager [phone]  If 7PM-7AM, please contact night-coverage [email or web address] Password [facility]  07/29/2022, 11:36 PM

## 2022-07-29 NOTE — H&P (Addendum)
History and Physical    Chelsea [name] DOB: 18-Jan-1966 DOA: 07/29/2022  PCP: Rudene Anda, MD  Patient coming from: Rehab facility  I have personally briefly reviewed patient's old medical records in [place]  Chief Complaint: increase O2 requirement / pulmonary edema on cxr  HPI: Chelsea [name] is a 56 y.o. female with medical history aortic stenosis, osteoarthritis, asthma, COPD/asthma, hx of tracheostomy, obesity hypoventilation syndrome/OSA on Cpap, chronic respiratory failure with hypoxia, morbid obesity, coronary disease, chronic diastolic CHF, GERD, hepatitis C, hyperlipidemia, hypertension, hypothyroidism, type 2 diabetes who has interim history of recent hospitalization 07/18/22-07/23/22 with discharge diagnosis of Upper GI bleed for which she was treated with 3 units of PRBC with appropriate response in hgb, with d/c hgb of 8.7, Egd at that time noted no active bleeding. Patient discharge to rehab no return from rehab after patient was note to have increase O2 requirement from baseline 3- to 6 L prompting cxr which noted ?pnx /pulmonary edema. Patient referred to ED for further evaluation and treatment. Patient during this time noted no complaints. She states she felt her normal self, no chest pain , sob, fever /chills increase weakness. She did however complain of loose stool over the last 3 days, associated with nausea but no abdominal pain pain. Patient does endorse dysfunction with bipap which she states she has note used since leaving the hospital.    ED Course:  In ED patient noted to have cxr ? Pul edema however CT thorax only noted small left sided pleural effusion and bnp  64 done from high of 300's.  Patient also in ED lying flat w/o difficulty and noted swelling in low extremity at baseline to improved.  Patient initially thought to have possible CHF and initially ordered lasix however this was d/ced due  to lower BP thought to be related to volume  loss in setting of diarrhea , poor intake and diuretic use. Patient of not also has elevated d-dimer ( and contrast allergy) venous dopplers ordered.  Patient admitted for further evaluation of soft blood pressures and acute on chronic respiratory failure.   Afeb, bp 87/46,90./50, hr 75 , rr 22 sat 91% o 6l Snr , nonspecific st -twave abn no sig change from prior Cxr IMPRESSION: Cardiomegaly with vascular congestion. CT chest IMPRESSION: Small left pleural effusion with left lower lobe atelectasis. Scattered areas of atelectasis or scarring in the right lung and left upper lobe.   Labs: wbc 5.4, hgb 9.9 + d-dimer Na 142, K 3.8, cr 0.68, glu 163,  Mag 1.5 Bnp 64.3 ( high 300;s) Vb; 7.54/pco2 44 Na139, K 3.9, cr 0.7 glu 168 Tx Kcl , mag 2gram , oxycodone,lasix 60 -held due to low bp and exam /imaging /history not suggestive of florid chf exacerbation Review of Systems: As per HPI otherwise 10 point review of systems negative.   Past Medical History:  Diagnosis Date   Aortic stenosis    Echocardiogram 02/21/11:  Mean gradient 23 mm of mercury; peak gradient 36;; Turbulence noted in the area of the aortic isthmus with peak gradient 23 mmHg-consider MRI to assess for coarctation;    TEE: 04/02/11:  EF 55-60%, mod BAE, trileaflet AV with mild AS (pk and mean 25 and 16), mild AI, mild MR, mild TR, atrial septal aneurysm, no evidence of corarctation   Arthritis    "qwhere" (12/05/2017)   Asthma    Cervical cancer ([place]) 2006   CHF (congestive heart failure) (HCC)    Chronic lower  back pain    Complication of anesthesia    "I have a hard time waking up from under it" (12/05/2017)   COPD (chronic obstructive pulmonary disease) ([place])    "lung dr said I don't have this" (12/05/2017)   Coronary artery disease    Diastolic congestive heart failure ([place])    Echo 02/21/11: EF 55-60%, moderate LVH, grade 2 diastolic dysfunction, mild to moderate aortic stenosis with a mean gradient 23 mm of mercury    GERD (gastroesophageal reflux disease)    Heart murmur    Hepatitis C    History of gout X 1   Hyperlipidemia    Hypertension    Hypotension arterial 04/07/2022   Hypothyroid    IBS (irritable bowel syndrome)    Migraine    "monthly" (12/05/2017)   Obesity hypoventilation syndrome ([place])    On home oxygen therapy    "2L all the time" (12/05/2017)   OSA treated with BiPAP    "have CPAP at home too; wearing BiPAP right now" (12/05/2017)   Pneumonia    "several times" (12/05/2017)   Type II diabetes mellitus ([place])     Past Surgical History:  Procedure Laterality Date   ABDOMINAL SURGERY  2006 X 2   "TAH incision was infected"   BIOPSY  07/21/2022   Procedure: BIOPSY;  Surgeon: Otis Brace, MD;  Location: [facility];  Service: Gastroenterology;;   CHOLECYSTECTOMY OPEN     ESOPHAGOGASTRODUODENOSCOPY N/A 07/21/2022   Procedure: ESOPHAGOGASTRODUODENOSCOPY (EGD);  Surgeon: Otis Brace, MD;  Location: [facility];  Service: Gastroenterology;  Laterality: N/A;   TONSILLECTOMY     TOTAL ABDOMINAL HYSTERECTOMY  2006   TRACHEOSTOMY REVISION N/A 04/09/2022   Procedure: CONTROL OF BLEEDING,TRACHEOSTOMY SITE;  Surgeon: Melida Quitter, MD;  Location: [facility];  Service: ENT;  Laterality: N/A;     reports that she has never smoked. She has never used smokeless tobacco. She reports that she does not currently use alcohol. She reports that she does not use drugs.  Allergies  Allergen Reactions   Iodinated Contrast Media Anaphylaxis   Penicillins Anaphylaxis    Patient tolerates cefepime (08/2017)   Insulin Lispro Other (See Comments)    Adder per MAR from SNF   Minoxidil     Other reaction(s): hives    Family History  Problem Relation Age of Onset   Heart disease Father     Prior to Admission medications   Medication Sig Start Date End Date Taking? Authorizing Provider  acetaminophen (TYLENOL) 325 MG tablet Take 650 mg by mouth every 6 (six) hours as needed for mild pain.     [provider]  albuterol (PROVENTIL) (2.5 MG/3ML) 0.083% nebulizer solution Take 3 mLs (2.5 mg total) by nebulization every 6 (six) hours as needed for wheezing or shortness of breath (I50.32). 07/04/16   [name], MD  ALPRAZolam Duanne Moron) 0.5 MG tablet Take 1 tablet (0.5 mg total) by mouth every 8 (eight) hours as needed for anxiety. 07/23/22   Mercy Riding, MD  budesonide (PULMICORT) 0.5 MG/2ML nebulizer solution Inhale 0.5 mg into the lungs every 12 (twelve) hours.    [provider]  Cholecalciferol 125 MCG (5000 UT) TABS Take 1 tablet (5,000 Units total) by mouth daily. Patient not taking: Reported on 07/18/2022 07/10/22   [name], MD  cyanocobalamin (VITAMIN B12) 1000 MCG tablet Take 1 tablet (1,000 mcg total) by mouth daily. 07/10/22   [name], [name], MD  diclofenac Sodium (VOLTAREN) 1 % GEL Apply  4 g topically every 6 (six) hours as needed (pain).    [provider]  ELIQUIS 5 MG TABS tablet Take 5 mg by mouth 2 (two) times daily. 04/12/21   [provider]  ferrous sulfate 324 MG TBEC Take 324 mg by mouth daily.    [provider]  folic acid (FOLVITE) 1 MG tablet Take 1 tablet (1 mg total) by mouth daily. 07/10/22   [name], [name], MD  gabapentin (NEURONTIN) 400 MG capsule Take 400 mg by mouth 3 (three) times daily.    [provider]  GLUCAGON HCL IJ Inject 1 mg into the skin daily as needed (Hypoglycemia).    [provider]  insulin aspart (NOVOLOG) 100 UNIT/ML injection insulin aspart (novoLOG) injection 0-24 Units 0-24 Units, Subcutaneous, 3 times daily with meals, CBG < 70: Implement Hypoglycemia measures, call MD CBG < 120 (dose in units): 0 CBG 120 - 160 (dose in units): 2 CBG 161 - 200 (dose in units): 4 CBG 201 - 250 (dose in units): 8 CBG 251 - 300 (dose in units): 12 CBG 301 - 350 (dose in units): 16 CBG 351 - 450 (dose in units): 20 CBG > 450: 24 units and obtain STAT glucose and  notify MD 07/10/22   [name], MD  levothyroxine (SYNTHROID) 150 MCG tablet Take 1 tablet (150 mcg total) by mouth daily at 6 (six) AM. 07/10/22   [name], [name], MD  Menthol, Topical Analgesic, (BIOFREEZE EX) Apply 1 application  topically as needed (fo pain).    [provider]  pantoprazole (PROTONIX) 40 MG tablet Take 1 tablet (40 mg total) by mouth daily. 07/23/22   Mercy Riding, MD  polyethylene glycol (MIRALAX / GLYCOLAX) 17 g packet Take 17 g by mouth daily. 07/10/22   [name], [name], MD  potassium chloride SA (KLOR-CON M) 20 MEQ tablet Take 1 tablet (20 mEq total) by mouth daily. 07/10/22   [name], [name], MD  senna-docusate (SENOKOT-S) 8.6-50 MG tablet Take 2 tablets by mouth 2 (two) times daily. Patient taking differently: Take 1 tablet by mouth 2 (two) times daily. 07/10/22   [name], [name], MD  torsemide (DEMADEX) 20 MG tablet Take 40 mg by mouth daily.    [provider]    Physical Exam: Vitals:   07/29/22 2013 07/29/22 2019 07/29/22 2020 07/29/22 2307  BP:    (!) 90/50  Pulse:    76  Resp:    20  Temp:      TempSrc:      SpO2: 91% 92%  93%  Weight:   (!) 145.2 kg   Height:   '5\' 5"'$  (1.651 m)     Constitutional: NAD, calm, comfortable Vitals:   07/29/22 2013 07/29/22 2019 07/29/22 2020 07/29/22 2307  BP:    (!) 90/50  Pulse:    76  Resp:    20  Temp:      TempSrc:      SpO2: 91% 92%  93%  Weight:   (!) 145.2 kg   Height:   '5\' 5"'$  (1.651 m)    Eyes: PERRL, lids and conjunctivae normal ENMT: Mucous membranes are moist. Posterior pharynx clear of any exudate or lesions.Normal dentition.  Neck: normal, supple, no masses, no thyromegaly Respiratory: clear to auscultation bilaterally, no wheezing, no crackles. Normal respiratory effort. No accessory muscle use.  Cardiovascular: Regular rate and rhythm, no murmurs / rubs / gallops. No extremity edema. 2+ pedal pulses Abdomen: no tenderness,  no masses palpated. No  hepatosplenomegaly. Bowel sounds positive.  Musculoskeletal: no clubbing / cyanosis. No joint deformity upper and lower extremities. Good ROM, no contractures. Normal muscle tone.  Skin: no rashes, lesions, ulcers. No induration Neurologic: CN 2-12 grossly intact. Sensation intact,Strength 5/5 in all 4.  Psychiatric: Normal judgment and insight. Alert and oriented x 3. Normal mood.    Labs on Admission: I have personally reviewed following labs and imaging studies  CBC: Recent Labs  Lab 07/23/22 0359 07/29/22 2122 07/29/22 2129  WBC 5.5 5.4  --   NEUTROABS  --  2.9  --   HGB 8.7* 8.8* 10.2*  9.9*  HCT 29.2* 30.5* 30.0*  29.0*  MCV 88.2 92.7  --   PLT 212 212  --    Basic Metabolic Panel: Recent Labs  Lab 07/23/22 0359 07/29/22 2122 07/29/22 2129  NA 140 142 139  139  K 4.1 3.8 3.9  3.8  CL 93* 96* 94*  CO2 39* 38*  --   GLUCOSE 132* 163* 168*  BUN 23* 11 15  CREATININE 0.72 0.68 0.70  CALCIUM 8.2* 8.2*  --   MG 1.8 1.5*  --   PHOS 5.0*  --   --    GFR: Estimated Creatinine Clearance: 111.7 mL/min (by C-G formula based on SCr of 0.7 mg/dL). Liver Function Tests: Recent Labs  Lab 07/23/22 0359 07/29/22 2122  AST 13* 16  ALT 10 9  ALKPHOS 63 70  BILITOT 0.6 0.3  PROT 6.2* 6.0*  ALBUMIN 3.0* 2.9*   No results for input(s): "LIPASE", "AMYLASE" in the last 168 hours. No results for input(s): "AMMONIA" in the last 168 hours. Coagulation Profile: No results for input(s): "INR", "PROTIME" in the last 168 hours. Cardiac Enzymes: No results for input(s): "CKTOTAL", "CKMB", "CKMBINDEX", "TROPONINI" in the last 168 hours. BNP (last 3 results) No results for input(s): "PROBNP" in the last 8760 hours. HbA1C: No results for input(s): "HGBA1C" in the last 72 hours. CBG: Recent Labs  Lab 07/23/22 0714 07/23/22 1125  GLUCAP 142* 190*   Lipid Profile: No results for input(s): "CHOL", "HDL", "LDLCALC", "TRIG", "CHOLHDL", "LDLDIRECT" in the last 72 hours. Thyroid  Function Tests: No results for input(s): "TSH", "T4TOTAL", "FREET4", "T3FREE", "THYROIDAB" in the last 72 hours. Anemia Panel: No results for input(s): "VITAMINB12", "FOLATE", "FERRITIN", "TIBC", "IRON", "RETICCTPCT" in the last 72 hours. Urine analysis:    Component Value Date/Time   COLORURINE STRAW (A) 07/04/2022 2300   APPEARANCEUR CLEAR 07/04/2022 2300   LABSPEC 1.008 07/04/2022 2300   PHURINE 6.0 07/04/2022 2300   GLUCOSEU NEGATIVE 07/04/2022 2300   HGBUR NEGATIVE 07/04/2022 2300   BILIRUBINUR NEGATIVE 07/04/2022 2300   KETONESUR NEGATIVE 07/04/2022 2300   PROTEINUR NEGATIVE 07/04/2022 2300   UROBILINOGEN 0.2 12/08/2010 0545   NITRITE NEGATIVE 07/04/2022 2300   LEUKOCYTESUR NEGATIVE 07/04/2022 2300    Radiological Exams on Admission: DG Chest 1 View  Result Date: 07/29/2022 CLINICAL DATA:  Abnormal chest radiograph. EXAM: CHEST  1 VIEW COMPARISON:  Chest radiograph dated 07/19/2022. FINDINGS: Cardiomegaly with vascular congestion. No focal consolidation, pleural effusion, or pneumothorax. No acute osseous pathology. IMPRESSION: Cardiomegaly with vascular congestion. Electronically Signed   By: Anner Crete M.D.   On: 07/29/2022 20:55    EKG: Independently reviewed. See above   Assessment/Plan  Acute on chronic respiratory failure  -admit to progressive care -+ d-dimer r/o PE /doppler penidng/ V/Q in am  -of note in setting of noncompliance with bipap due to machine dysfunction  -resume  bipap qhs  -less likely CHF exacerbation base on history / imaging and labs    Diarrhea  -possible medication side-effect vs gastroenteritis nos  -gi panel pending   Morbid Obesity/hypoventilation syndrome /OSA -resume bipap   DCHF -appears compenstated  -resume home regimen  -torsemide '40mg'$  bid   P. Afib  -currently in sinus  -continue on Eliquis   Neuropathy /chronic pain syndrome  -resume home regimen   Hypomagnesemia  -replaced in ED -monitor las    Hypothyrodism -continue synthroid   GERD -ppi    Chronic debility  -currently at Rehab  -Pt/Ot to follow     DVT prophylaxis:  eliquis  Code Status: full Family Communication: on Plan: Strader,Dewayne (Significant other)  [phone] (Mobile)  Consults called: n/a Admission status: progressive care    Clance Boll MD [facility]   If 7PM-7AM, please contact night-coverage [email or web address] Password [facility]  07/29/2022, 11:36 PM

## 2022-07-29 NOTE — ED Triage Notes (Signed)
Pt arrived by EMS from [facility], concerned for Abnormal chest x-ray result. Pt had a follow-up imaging today that showed left lung changes   Left lung sounds are diminished. Pt normally on 3L [place] but now requiring 6L for sats 92%.   Pt denies feeling shortness of breath or chest pain at this this  Pt had trach removed about 1.5 months ago.

## 2022-07-29 NOTE — ED Provider Notes (Signed)
[facility] EMERGENCY DEPARTMENT Provider Note   CSN: [id number] Arrival date & time: 07/29/22  1958     History {Add pertinent medical, surgical, social history, OB history to HPI:1} No chief complaint on file.   Chelsea [name] is a 56 y.o. female.  HPI Patient presents for acute on chronic hypoxia.  Medical history includes DM, OSA, HTN, GERD, hypothyroidism, CHF, arthritis, obesity, hepatitis C, chronic hypoxia, HLD, RLS, COPD.  She was last in the hospital 1 week ago for melena.  She did undergo EGD during that hospital stay.  She was discharged to [facility] facility.  At baseline, she is on 4 L of supplemental oxygen.  Today, patient underwent a chest x-ray which showed concern of fluid accumulation and pleural effusion.  She has had an increased oxygen demand and is currently on 6 L of supplemental oxygen with SPO2 in the low 90s.  EMS also noted low blood pressures which the patient states is normal for her.  Patient denies any current or recent symptoms other than some loose stools per review of rehab center paperwork, patient does take MiraLAX daily.  Additional medications include torsemide, 40 mg daily   Home Medications Prior to Admission medications   Medication Sig Start Date End Date Taking? Authorizing Provider  acetaminophen (TYLENOL) 325 MG tablet Take 650 mg by mouth every 6 (six) hours as needed for mild pain.    [provider]  albuterol (PROVENTIL) (2.5 MG/3ML) 0.083% nebulizer solution Take 3 mLs (2.5 mg total) by nebulization every 6 (six) hours as needed for wheezing or shortness of breath (I50.32). 07/04/16   [name], MD  ALPRAZolam Duanne Moron) 0.5 MG tablet Take 1 tablet (0.5 mg total) by mouth every 8 (eight) hours as needed for anxiety. 07/23/22   Mercy Riding, MD  budesonide (PULMICORT) 0.5 MG/2ML nebulizer solution Inhale 0.5 mg into the lungs every 12 (twelve) hours.    [provider]   Cholecalciferol 125 MCG (5000 UT) TABS Take 1 tablet (5,000 Units total) by mouth daily. Patient not taking: Reported on 07/18/2022 07/10/22   [name], MD  cyanocobalamin (VITAMIN B12) 1000 MCG tablet Take 1 tablet (1,000 mcg total) by mouth daily. 07/10/22   [name], [name], MD  diclofenac Sodium (VOLTAREN) 1 % GEL Apply 4 g topically every 6 (six) hours as needed (pain).    [provider]  ELIQUIS 5 MG TABS tablet Take 5 mg by mouth 2 (two) times daily. 04/12/21   [provider]  ferrous sulfate 324 MG TBEC Take 324 mg by mouth daily.    [provider]  folic acid (FOLVITE) 1 MG tablet Take 1 tablet (1 mg total) by mouth daily. 07/10/22   [name], [name], MD  gabapentin (NEURONTIN) 400 MG capsule Take 400 mg by mouth 3 (three) times daily.    [provider]  GLUCAGON HCL IJ Inject 1 mg into the skin daily as needed (Hypoglycemia).    [provider]  insulin aspart (NOVOLOG) 100 UNIT/ML injection insulin aspart (novoLOG) injection 0-24 Units 0-24 Units, Subcutaneous, 3 times daily with meals, CBG < 70: Implement Hypoglycemia measures, call MD CBG < 120 (dose in units): 0 CBG 120 - 160 (dose in units): 2 CBG 161 - 200 (dose in units): 4 CBG 201 - 250 (dose in units): 8 CBG 251 - 300 (dose in units): 12 CBG 301 - 350 (dose in units): 16 CBG 351 - 450 (dose  in units): 20 CBG > 450: 24 units and obtain STAT glucose and notify MD 07/10/22   [name], MD  levothyroxine (SYNTHROID) 150 MCG tablet Take 1 tablet (150 mcg total) by mouth daily at 6 (six) AM. 07/10/22   [name], [name], MD  Menthol, Topical Analgesic, (BIOFREEZE EX) Apply 1 application  topically as needed (fo pain).    [provider]  pantoprazole (PROTONIX) 40 MG tablet Take 1 tablet (40 mg total) by mouth daily. 07/23/22   Mercy Riding, MD  polyethylene glycol (MIRALAX / GLYCOLAX) 17 g packet Take 17 g by mouth daily. 07/10/22   [name],  [name], MD  potassium chloride SA (KLOR-CON M) 20 MEQ tablet Take 1 tablet (20 mEq total) by mouth daily. 07/10/22   [name], [name], MD  senna-docusate (SENOKOT-S) 8.6-50 MG tablet Take 2 tablets by mouth 2 (two) times daily. Patient taking differently: Take 1 tablet by mouth 2 (two) times daily. 07/10/22   [name], [name], MD  torsemide (DEMADEX) 20 MG tablet Take 40 mg by mouth daily.    [provider]      Allergies    Iodinated contrast media, Penicillins, Insulin lispro, and Minoxidil    Review of Systems   Review of Systems  Respiratory:  Negative for cough and shortness of breath.   Gastrointestinal:  Positive for diarrhea.  All other systems reviewed and are negative.   Physical Exam Updated Vital Signs There were no vitals taken for this visit. Physical Exam Vitals and nursing note reviewed.  Constitutional:      General: She is not in acute distress.    Appearance: She is well-developed. She is obese. She is ill-appearing (Chronically). She is not toxic-appearing or diaphoretic.  HENT:     Head: Normocephalic and atraumatic.     Right Ear: External ear normal.     Left Ear: External ear normal.     Nose: Nose normal.     Mouth/Throat:     Mouth: Mucous membranes are moist.     Pharynx: Oropharynx is clear.  Eyes:     Extraocular Movements: Extraocular movements intact.     Conjunctiva/sclera: Conjunctivae normal.  Cardiovascular:     Rate and Rhythm: Normal rate and regular rhythm.     Heart sounds: No murmur heard. Pulmonary:     Effort: Pulmonary effort is normal. No tachypnea, accessory muscle usage, prolonged expiration or respiratory distress.     Breath sounds: Examination of the left-upper field reveals decreased breath sounds. Examination of the left-middle field reveals decreased breath sounds. Examination of the left-lower field reveals decreased breath sounds. Decreased breath sounds present. No wheezing, rhonchi or rales.      Comments: On 6 L of supplemental oxygen Abdominal:     General: There is no distension.     Palpations: Abdomen is soft.     Tenderness: There is no abdominal tenderness.  Musculoskeletal:        General: No swelling. Normal range of motion.     Cervical back: Normal range of motion and neck supple.     Right lower leg: Edema present.     Left lower leg: Edema present.  Skin:    General: Skin is warm and dry.     Coloration: Skin is not jaundiced or pale.  Neurological:     General: No focal deficit present.     Mental Status: She is alert and oriented to person, place, and time.  Psychiatric:  Mood and Affect: Mood normal.        Behavior: Behavior normal.        Thought Content: Thought content normal.        Judgment: Judgment normal.     ED Results / Procedures / Treatments   Labs (all labs ordered are listed, but only abnormal results are displayed) Labs Reviewed - No data to display  EKG None  Radiology No results found.  Procedures Procedures  {Document cardiac monitor, telemetry assessment procedure when appropriate:1}  Medications Ordered in ED Medications - No data to display  ED Course/ Medical Decision Making/ A&P                           Medical Decision Making  ***  {Document critical care time when appropriate:1} {Document review of labs and clinical decision tools ie heart score, Chads2Vasc2 etc:1}  {Document your independent review of radiology images, and any outside records:1} {Document your discussion with family members, caretakers, and with consultants:1} {Document social determinants of health affecting pt's care:1} {Document your decision making why or why not admission, treatments were needed:1} Final Clinical Impression(s) / ED Diagnoses Final diagnoses:  None    Rx / DC Orders ED Discharge Orders     None

## 2022-07-30 ENCOUNTER — Encounter (HOSPITAL_COMMUNITY): Payer: Medicare Other

## 2022-07-30 ENCOUNTER — Telehealth (HOSPITAL_COMMUNITY): Payer: Self-pay | Admitting: *Deleted

## 2022-07-30 ENCOUNTER — Inpatient Hospital Stay (HOSPITAL_COMMUNITY): Payer: Medicare Other

## 2022-07-30 DIAGNOSIS — M7989 Other specified soft tissue disorders: Secondary | ICD-10-CM

## 2022-07-30 DIAGNOSIS — F32A Depression, unspecified: Secondary | ICD-10-CM | POA: Diagnosis present

## 2022-07-30 DIAGNOSIS — G4733 Obstructive sleep apnea (adult) (pediatric): Secondary | ICD-10-CM | POA: Diagnosis not present

## 2022-07-30 DIAGNOSIS — E039 Hypothyroidism, unspecified: Secondary | ICD-10-CM | POA: Diagnosis present

## 2022-07-30 DIAGNOSIS — Z6841 Body Mass Index (BMI) 40.0 and over, adult: Secondary | ICD-10-CM | POA: Diagnosis not present

## 2022-07-30 DIAGNOSIS — I4892 Unspecified atrial flutter: Secondary | ICD-10-CM | POA: Diagnosis not present

## 2022-07-30 DIAGNOSIS — I5033 Acute on chronic diastolic (congestive) heart failure: Secondary | ICD-10-CM | POA: Diagnosis present

## 2022-07-30 DIAGNOSIS — G894 Chronic pain syndrome: Secondary | ICD-10-CM | POA: Diagnosis present

## 2022-07-30 DIAGNOSIS — J4489 Other specified chronic obstructive pulmonary disease: Secondary | ICD-10-CM | POA: Diagnosis present

## 2022-07-30 DIAGNOSIS — K219 Gastro-esophageal reflux disease without esophagitis: Secondary | ICD-10-CM | POA: Diagnosis present

## 2022-07-30 DIAGNOSIS — R0602 Shortness of breath: Secondary | ICD-10-CM

## 2022-07-30 DIAGNOSIS — G2581 Restless legs syndrome: Secondary | ICD-10-CM | POA: Diagnosis present

## 2022-07-30 DIAGNOSIS — E785 Hyperlipidemia, unspecified: Secondary | ICD-10-CM | POA: Diagnosis present

## 2022-07-30 DIAGNOSIS — I509 Heart failure, unspecified: Secondary | ICD-10-CM

## 2022-07-30 DIAGNOSIS — R079 Chest pain, unspecified: Secondary | ICD-10-CM

## 2022-07-30 DIAGNOSIS — I890 Lymphedema, not elsewhere classified: Secondary | ICD-10-CM | POA: Diagnosis present

## 2022-07-30 DIAGNOSIS — I2510 Atherosclerotic heart disease of native coronary artery without angina pectoris: Secondary | ICD-10-CM | POA: Diagnosis present

## 2022-07-30 DIAGNOSIS — I4891 Unspecified atrial fibrillation: Secondary | ICD-10-CM | POA: Diagnosis present

## 2022-07-30 DIAGNOSIS — J984 Other disorders of lung: Secondary | ICD-10-CM | POA: Diagnosis present

## 2022-07-30 DIAGNOSIS — J449 Chronic obstructive pulmonary disease, unspecified: Secondary | ICD-10-CM | POA: Diagnosis not present

## 2022-07-30 DIAGNOSIS — D638 Anemia in other chronic diseases classified elsewhere: Secondary | ICD-10-CM | POA: Diagnosis present

## 2022-07-30 DIAGNOSIS — B182 Chronic viral hepatitis C: Secondary | ICD-10-CM | POA: Diagnosis present

## 2022-07-30 DIAGNOSIS — E662 Morbid (severe) obesity with alveolar hypoventilation: Secondary | ICD-10-CM | POA: Diagnosis present

## 2022-07-30 DIAGNOSIS — I350 Nonrheumatic aortic (valve) stenosis: Secondary | ICD-10-CM | POA: Diagnosis present

## 2022-07-30 DIAGNOSIS — Z794 Long term (current) use of insulin: Secondary | ICD-10-CM | POA: Diagnosis not present

## 2022-07-30 DIAGNOSIS — E1142 Type 2 diabetes mellitus with diabetic polyneuropathy: Secondary | ICD-10-CM | POA: Diagnosis present

## 2022-07-30 DIAGNOSIS — J9811 Atelectasis: Secondary | ICD-10-CM | POA: Diagnosis present

## 2022-07-30 DIAGNOSIS — J9621 Acute and chronic respiratory failure with hypoxia: Secondary | ICD-10-CM | POA: Diagnosis present

## 2022-07-30 DIAGNOSIS — I110 Hypertensive heart disease with heart failure: Secondary | ICD-10-CM | POA: Diagnosis present

## 2022-07-30 HISTORY — DX: Heart failure, unspecified: I50.9

## 2022-07-30 LAB — TROPONIN I (HIGH SENSITIVITY)
Troponin I (High Sensitivity): 5 ng/L (ref <18)
Troponin I (High Sensitivity): 5 ng/L (ref <18)
Troponin I (High Sensitivity): 5 ng/L (ref <18)

## 2022-07-30 LAB — ECHOCARDIOGRAM COMPLETE
AR max vel: 1.12 cm2
AV Area VTI: 1.16 cm2
AV Area mean vel: 1.03 cm2
AV Mean grad: 31 mmHg
AV Peak grad: 51.3 mmHg
Ao pk vel: 3.58 m/s
Area-P 1/2: 3.77 cm2
Height: 65 in
S' Lateral: 3.3 cm
Weight: 5120 oz

## 2022-07-30 LAB — CBC
HCT: 41.2 % (ref 36.0–46.0)
Hemoglobin: 12.1 g/dL (ref 12.0–15.0)
MCH: 26.7 pg (ref 26.0–34.0)
MCHC: 29.4 g/dL — ABNORMAL LOW (ref 30.0–36.0)
MCV: 90.7 fL (ref 80.0–100.0)
Platelets: 146 10*3/uL — ABNORMAL LOW (ref 150–400)
RBC: 4.54 MIL/uL (ref 3.87–5.11)
RDW: 18.2 % — ABNORMAL HIGH (ref 11.5–15.5)
WBC: 3.9 10*3/uL — ABNORMAL LOW (ref 4.0–10.5)
nRBC: 0 % (ref 0.0–0.2)

## 2022-07-30 LAB — COMPREHENSIVE METABOLIC PANEL
ALT: 9 U/L (ref 0–44)
AST: 14 U/L — ABNORMAL LOW (ref 15–41)
Albumin: 2.8 g/dL — ABNORMAL LOW (ref 3.5–5.0)
Alkaline Phosphatase: 69 U/L (ref 38–126)
Anion gap: 10 (ref 5–15)
BUN: 13 mg/dL (ref 6–20)
CO2: 36 mmol/L — ABNORMAL HIGH (ref 22–32)
Calcium: 8.4 mg/dL — ABNORMAL LOW (ref 8.9–10.3)
Chloride: 95 mmol/L — ABNORMAL LOW (ref 98–111)
Creatinine, Ser: 0.76 mg/dL (ref 0.44–1.00)
GFR, Estimated: >60 mL/min (ref >60)
Glucose, Bld: 104 mg/dL — ABNORMAL HIGH (ref 70–99)
Potassium: 3.6 mmol/L (ref 3.5–5.1)
Sodium: 141 mmol/L (ref 135–145)
Total Bilirubin: 0.5 mg/dL (ref 0.3–1.2)
Total Protein: 6.1 g/dL — ABNORMAL LOW (ref 6.5–8.1)

## 2022-07-30 LAB — MAGNESIUM: Magnesium: 1.9 mg/dL (ref 1.7–2.4)

## 2022-07-30 LAB — TSH: TSH: 1.428 u[IU]/mL (ref 0.350–4.500)

## 2022-07-30 LAB — CBG MONITORING, ED: Glucose-Capillary: 123 mg/dL — ABNORMAL HIGH (ref 70–99)

## 2022-07-30 LAB — HEMOGLOBIN A1C
Hgb A1c MFr Bld: 5.2 % (ref 4.8–5.6)
Mean Plasma Glucose: 102.54 mg/dL

## 2022-07-30 MED ORDER — HEPARIN (PORCINE) 25000 UT/250ML-% IV SOLN
2000.0000 [IU]/h | INTRAVENOUS | Status: DC
  Administered 2022-07-30 (×2): 2000 [IU]/h via INTRAVENOUS
  Filled 2022-07-30 (×2): qty 250

## 2022-07-30 MED ORDER — ACETAMINOPHEN 325 MG PO TABS
650.0000 mg | ORAL_TABLET | Freq: Four times a day (QID) | ORAL | Status: DC | PRN
  Administered 2022-07-30 – 2022-08-01 (×2): 650 mg via ORAL
  Filled 2022-07-30 (×2): qty 2

## 2022-07-30 MED ORDER — OXYCODONE-ACETAMINOPHEN 5-325 MG PO TABS
1.0000 | ORAL_TABLET | Freq: Four times a day (QID) | ORAL | Status: DC | PRN
  Administered 2022-07-30 – 2022-08-03 (×11): 1 via ORAL
  Filled 2022-07-30 (×12): qty 1

## 2022-07-30 MED ORDER — ONDANSETRON HCL 4 MG/2ML IJ SOLN: 4.0000 mg | Freq: Four times a day (QID) | INTRAMUSCULAR | Status: DC | PRN

## 2022-07-30 MED ORDER — APIXABAN 5 MG PO TABS
5.0000 mg | ORAL_TABLET | Freq: Two times a day (BID) | ORAL | Status: DC
  Administered 2022-07-30 – 2022-08-03 (×8): 5 mg via ORAL
  Filled 2022-07-30 (×8): qty 1

## 2022-07-30 MED ORDER — ACETAMINOPHEN 650 MG RE SUPP: 650.0000 mg | Freq: Four times a day (QID) | RECTAL | Status: DC | PRN

## 2022-07-30 MED ORDER — HEPARIN BOLUS VIA INFUSION
3000.0000 [IU] | Freq: Once | INTRAVENOUS | Status: AC
  Administered 2022-07-30: 3000 [IU] via INTRAVENOUS
  Filled 2022-07-30: qty 3000

## 2022-07-30 MED ORDER — OXYCODONE-ACETAMINOPHEN 5-325 MG PO TABS
1.0000 | ORAL_TABLET | Freq: Once | ORAL | Status: AC
  Administered 2022-07-30: 1 via ORAL
  Filled 2022-07-30: qty 1

## 2022-07-30 MED ORDER — ONDANSETRON HCL 4 MG PO TABS: 4.0000 mg | ORAL_TABLET | Freq: Four times a day (QID) | ORAL | Status: DC | PRN

## 2022-07-30 MED ORDER — ALBUTEROL SULFATE (2.5 MG/3ML) 0.083% IN NEBU: 2.5000 mg | INHALATION_SOLUTION | RESPIRATORY_TRACT | Status: DC | PRN

## 2022-07-30 MED ORDER — MUSCLE RUB 10-15 % EX CREA
TOPICAL_CREAM | CUTANEOUS | Status: DC | PRN
  Filled 2022-07-30: qty 85

## 2022-07-30 MED ORDER — MORPHINE SULFATE (PF) 2 MG/ML IV SOLN
2.0000 mg | INTRAVENOUS | Status: DC | PRN
  Administered 2022-07-30 (×2): 2 mg via INTRAVENOUS
  Filled 2022-07-30 (×2): qty 1

## 2022-07-30 MED ORDER — TECHNETIUM TO 99M ALBUMIN AGGREGATED
3.9000 | Freq: Once | INTRAVENOUS | Status: AC | PRN
  Administered 2022-07-30: 3.9 via INTRAVENOUS

## 2022-07-30 NOTE — Telephone Encounter (Signed)
Call attempted to confirm [facility] TOC appt 2 pm on 07/30/22. HIPPA appropriate VM left with callback number.   Earnestine Leys, BSN, Clinical cytogeneticist Only

## 2022-07-30 NOTE — Progress Notes (Signed)
ANTICOAGULATION CONSULT NOTE - Initial Consult  Pharmacy Consult for heparin Indication: suspected pulmonary embolus  Allergies  Allergen Reactions   Iodinated Contrast Media Anaphylaxis   Penicillins Anaphylaxis    Patient tolerates cefepime (08/2017)   Insulin Lispro Other (See Comments)    Adder per [facility] from SNF   Minoxidil     Other reaction(s): hives    Patient Measurements: Height: '5\' 5"'$  (165.1 cm) Weight: (!) 145.2 kg (320 lb) IBW/kg (Calculated) : 57 Heparin Dosing Weight: 95kg  Vital Signs: Temp: 98.6 F (37 C) (10/30 0047) Temp Source: Oral (10/30 0047) BP: 108/57 (10/30 0300) Pulse Rate: 68 (10/30 0440)  Labs: Recent Labs    07/29/22 2122 07/29/22 2129 07/30/22 0201  HGB 8.8* 10.2*  9.9* 12.1  HCT 30.5* 30.0*  29.0* 41.2  PLT 212  --  146*  CREATININE 0.68 0.70 0.76  TROPONINIHS  --   --  5    Estimated Creatinine Clearance: 111.7 mL/min (by C-G formula based on SCr of 0.76 mg/dL).   Medical History: Past Medical History:  Diagnosis Date   Aortic stenosis    Echocardiogram 02/21/11:  Mean gradient 23 mm of mercury; peak gradient 36;; Turbulence noted in the area of the aortic isthmus with peak gradient 23 mmHg-consider MRI to assess for coarctation;    TEE: 04/02/11:  EF 55-60%, mod BAE, trileaflet AV with mild AS (pk and mean 25 and 16), mild AI, mild MR, mild TR, atrial septal aneurysm, no evidence of corarctation   Arthritis    "qwhere" (12/05/2017)   Asthma    Cervical cancer ([place]) 2006   CHF (congestive heart failure) (HCC)    Chronic lower back pain    Complication of anesthesia    "I have a hard time waking up from under it" (12/05/2017)   COPD (chronic obstructive pulmonary disease) ([place])    "lung dr said I don't have this" (12/05/2017)   Coronary artery disease    Diastolic congestive heart failure ([place])    Echo 02/21/11: EF 55-60%, moderate LVH, grade 2 diastolic dysfunction, mild to moderate aortic stenosis with a mean gradient 23 mm of  mercury   GERD (gastroesophageal reflux disease)    Heart murmur    Hepatitis C    History of gout X 1   Hyperlipidemia    Hypertension    Hypotension arterial 04/07/2022   Hypothyroid    IBS (irritable bowel syndrome)    Migraine    "monthly" (12/05/2017)   Obesity hypoventilation syndrome ([place])    On home oxygen therapy    "2L all the time" (12/05/2017)   OSA treated with BiPAP    "have CPAP at home too; wearing BiPAP right now" (12/05/2017)   Pneumonia    "several times" (12/05/2017)   Type II diabetes mellitus ([place])     Assessment: 56yo female presents to ED after an abnormal CXR result, admitted for acute on chronic respiratory failure, found to have elevated D-dimer, to begin heparin while awaiting doppler and VQ scan; pt is on Eliquis PTA for Afib, last dose 10/29 am.  Pt had been on heparin earlier this year and required high rates for therapeutic levels.  Goal of Therapy:  Heparin level 0.3-0.7 units/ml aPTT 66-102 seconds Monitor platelets by anticoagulation protocol: Yes   Plan:  Heparin 3000 units IV bolus x1 followed by infusion at 2000 units/hr. Monitor heparin levels, aPTT (while Eliquis affects anti-Xa assay), and CBC.  Wynona Neat, PharmD, BCPS  07/30/2022,4:55 AM

## 2022-07-30 NOTE — Progress Notes (Signed)
VASCULAR LAB    Bilateral lower extremity venous duplex has been performed.  See CV proc for preliminary results.   Jadarious Dobbins, RVT 07/30/2022, 4:33 PM

## 2022-07-30 NOTE — Progress Notes (Signed)
PROGRESS NOTE  [name]:[id number] DOB: [birth year] DOA: 07/29/2022 PCP: Rudene Anda, MD  HPI/Recap of past 24 hours: Chelsea [name] is a 56 y.o. female with medical history aortic stenosis, COPD/asthma, hx of tracheostomy, obesity hypoventilation syndrome/OSA now on bipap, chronic respiratory failure with hypoxia, morbid obesity, coronary disease, chronic diastolic CHF, GERD, hepatitis C, hyperlipidemia, hypertension, hypothyroidism, type 2 diabetes who has interim history of recent hospitalization 07/18/22-07/23/22 with discharge diagnosis of Upper GI bleed for which she was treated with 3 units of PRBC. Patient discharged to rehab and presented again to the ED due to increase O2 requirement from baseline 3 to 6 L.  In the ED, she complained about chest pain. Patient also endorses issues with bipap which she states she has not used since leaving the hospital.  Due to her bedbound status, new hypoxemia, episode of chest pain, patient admitted to rule out PE and ACS.      Today, patient denies any new complaints, denies any worsening shortness of breath, chest pain, abdominal pain, nausea/vomiting, fever/chills.    Assessment/Plan: Principal Problem:   CHF exacerbation (HCC) Active Problems:   CHF (congestive heart failure) (HCC)  Acute on chronic hypoxic respiratory failure  Baseline O2 3L, required up to 6 L, now improved to about 4 L Has not used BiPAP in about a week since discharge Positive D-dimer, Doppler and V/Q scan pending Continue heparin drip for now and switch back to PTA Eliquis if VQ scan negative Continue supplemental O2, BiPAP during naps and nighttime DuoNebs, inhalers  Chronic diastolic HF Appears compensated BNP 64 Echo showed EF of 60 to 65%, no regional wall motion abnormality, grade 2 diastolic dysfunction Strict I's and O's   Morbid Obesity/hypoventilation syndrome /OSA Continue BiPAP Lifestyle modification advised   P. Afib  Heart  rate controlled Continue on Eliquis   Neuropathy /chronic pain syndrome  Resume home regimen    Hypothyrodism Continue synthroid    GERD Continue PPI   Chronic debility  Admitted from SNF PT/OT Likely to DC to SNF to continue rehab    Estimated body mass index is 53.25 kg/m as calculated from the following:   Height as of this encounter: '5\' 5"'$  (1.651 m).   Weight as of this encounter: 145.2 kg.     Code Status: Full  Family Communication: None at bedside  Disposition Plan: Status is: Inpatient Remains inpatient appropriate because: Level of care      Consultants: None  Procedures: None  Antimicrobials: None  DVT prophylaxis: Heparin drip   Objective: Vitals:   07/30/22 0830 07/30/22 0900 07/30/22 0948 07/30/22 1017  BP:  (!) 95/53  106/81  Pulse: 72 77 75 76  Resp: 19 19 (!) 21 19  Temp:    98.2 F (36.8 C)  TempSrc:    Oral  SpO2: 97% 95% 92% 90%  Weight:      Height:    '5\' 5"'$  (1.651 m)    Intake/Output Summary (Last 24 hours) at 07/30/2022 1354 Last data filed at 07/30/2022 0240 Gross per 24 hour  Intake 50 ml  Output --  Net 50 ml   Filed Weights   07/29/22 2020  Weight: (!) 145.2 kg    Exam: General: NAD, obese Cardiovascular: S1, S2 present Respiratory: CTAB Abdomen: Soft, nontender, nondistended, bowel sounds present Musculoskeletal: Trace bilateral pedal edema noted, venous stasis changes on bilateral lower extremity Skin: Above Psychiatry: Normal mood     Data Reviewed: CBC: Recent Labs  Lab 07/29/22 2122  07/29/22 2129 07/30/22 0201  WBC 5.4  --  3.9*  NEUTROABS 2.9  --   --   HGB 8.8* 10.2*  9.9* 12.1  HCT 30.5* 30.0*  29.0* 41.2  MCV 92.7  --  90.7  PLT 212  --  829*   Basic Metabolic Panel: Recent Labs  Lab 07/29/22 2122 07/29/22 2129 07/30/22 0201 07/30/22 0755  NA 142 139  139 141  --   K 3.8 3.9  3.8 3.6  --   CL 96* 94* 95*  --   CO2 38*  --  36*  --   GLUCOSE 163* 168* 104*  --   BUN '11  15 13  '$ --   CREATININE 0.68 0.70 0.76  --   CALCIUM 8.2*  --  8.4*  --   MG 1.5*  --   --  1.9   GFR: Estimated Creatinine Clearance: 111.7 mL/min (by C-G formula based on SCr of 0.76 mg/dL). Liver Function Tests: Recent Labs  Lab 07/29/22 2122 07/30/22 0201  AST 16 14*  ALT 9 9  ALKPHOS 70 69  BILITOT 0.3 0.5  PROT 6.0* 6.1*  ALBUMIN 2.9* 2.8*   No results for input(s): "LIPASE", "AMYLASE" in the last 168 hours. No results for input(s): "AMMONIA" in the last 168 hours. Coagulation Profile: No results for input(s): "INR", "PROTIME" in the last 168 hours. Cardiac Enzymes: No results for input(s): "CKTOTAL", "CKMB", "CKMBINDEX", "TROPONINI" in the last 168 hours. BNP (last 3 results) No results for input(s): "PROBNP" in the last 8760 hours. HbA1C: Recent Labs    07/30/22 0201  HGBA1C 5.2   CBG: Recent Labs  Lab 07/30/22 0750  GLUCAP 123*   Lipid Profile: No results for input(s): "CHOL", "HDL", "LDLCALC", "TRIG", "CHOLHDL", "LDLDIRECT" in the last 72 hours. Thyroid Function Tests: Recent Labs    07/30/22 0201  TSH 1.428   Anemia Panel: No results for input(s): "VITAMINB12", "FOLATE", "FERRITIN", "TIBC", "IRON", "RETICCTPCT" in the last 72 hours. Urine analysis:    Component Value Date/Time   COLORURINE STRAW (A) 07/04/2022 2300   APPEARANCEUR CLEAR 07/04/2022 2300   LABSPEC 1.008 07/04/2022 2300   PHURINE 6.0 07/04/2022 2300   GLUCOSEU NEGATIVE 07/04/2022 2300   HGBUR NEGATIVE 07/04/2022 2300   BILIRUBINUR NEGATIVE 07/04/2022 2300   KETONESUR NEGATIVE 07/04/2022 2300   PROTEINUR NEGATIVE 07/04/2022 2300   UROBILINOGEN 0.2 12/08/2010 0545   NITRITE NEGATIVE 07/04/2022 2300   LEUKOCYTESUR NEGATIVE 07/04/2022 2300   Sepsis Labs: '@LABRCNTIP'$ (procalcitonin:4,lacticidven:4)  )No results found for this or any previous visit (from the past 240 hour(s)).    Studies: ECHOCARDIOGRAM COMPLETE  Result Date: 07/30/2022    ECHOCARDIOGRAM REPORT   Patient Name:    Chelsea [name] Date of Exam: 07/30/2022 Medical Rec #:  [id number]            Height:       65.0 in Accession #:    [id number]           Weight:       320.0 lb Date of Birth:  11/17/1965            BSA:          2.415 m Patient Age:    73 years             BP:           106/81 mmHg Patient Gender: F  HR:           70 bpm. Exam Location:  Inpatient Procedure: 2D Echo, Color Doppler and Cardiac Doppler Indications:    chest pain  History:        Patient has prior history of Echocardiogram examinations, most                 recent 06/12/2022. CHF; Risk Factors:Sleep Apnea, Diabetes,                 Hypertension and Dyslipidemia.  Sonographer:    Johny Chess RDCS Referring Phys: [id number] SARA-MAIZ A THOMAS  Sonographer Comments: Patient is obese. Image acquisition challenging due to patient body habitus. IMPRESSIONS  1. Left ventricular ejection fraction, by estimation, is 60 to 65%. The left ventricle has normal function. The left ventricle has no regional wall motion abnormalities. There is mild left ventricular hypertrophy. Left ventricular diastolic parameters are consistent with Grade II diastolic dysfunction (pseudonormalization). Elevated left ventricular end-diastolic pressure.  2. Right ventricular systolic function is normal. The right ventricular size is mildly enlarged. Tricuspid regurgitation signal is inadequate for assessing PA pressure.  3. Left atrial size was severely dilated.  4. The mitral valve is degenerative. Trivial mitral valve regurgitation. No evidence of mitral stenosis.  5. The aortic valve is abnormal. Unable to determine aortic valve morphology due to image quality. There is moderate calcification of the aortic valve. Aortic valve regurgitation is trivial. Moderate aortic valve stenosis. Aortic valve area, by VTI measures 1.16 cm. Aortic valve mean gradient measures 31.0 mmHg. Aortic valve Vmax measures 3.58 m/s.  6. The inferior vena cava is dilated in size  with >50% respiratory variability, suggesting right atrial pressure of 8 mmHg.  7. Increased flow velocities may be secondary to anemia, thyrotoxicosis, hyperdynamic or high flow state. FINDINGS  Left Ventricle: Left ventricular ejection fraction, by estimation, is 60 to 65%. The left ventricle has normal function. The left ventricle has no regional wall motion abnormalities. The left ventricular internal cavity size was normal in size. There is  mild left ventricular hypertrophy. Left ventricular diastolic parameters are consistent with Grade II diastolic dysfunction (pseudonormalization). Elevated left ventricular end-diastolic pressure. Right Ventricle: The right ventricular size is mildly enlarged. No increase in right ventricular wall thickness. Right ventricular systolic function is normal. Tricuspid regurgitation signal is inadequate for assessing PA pressure. Left Atrium: Left atrial size was severely dilated. Right Atrium: Right atrial size was normal in size. Pericardium: There is no evidence of pericardial effusion. Mitral Valve: The mitral valve is degenerative in appearance. Mild mitral annular calcification. Trivial mitral valve regurgitation. No evidence of mitral valve stenosis. Tricuspid Valve: The tricuspid valve is normal in structure. Tricuspid valve regurgitation is trivial. No evidence of tricuspid stenosis. Aortic Valve: The aortic valve is abnormal. There is moderate calcification of the aortic valve. Aortic valve regurgitation is trivial. Moderate aortic stenosis is present. Aortic valve mean gradient measures 31.0 mmHg. Aortic valve peak gradient measures 51.3 mmHg. Aortic valve area, by VTI measures 1.16 cm. Pulmonic Valve: The pulmonic valve was normal in structure. Pulmonic valve regurgitation is trivial. No evidence of pulmonic stenosis. Aorta: The aortic root is normal in size and structure. Venous: The inferior vena cava is dilated in size with greater than 50% respiratory  variability, suggesting right atrial pressure of 8 mmHg. IAS/Shunts: The atrial septum is grossly normal.  LEFT VENTRICLE PLAX 2D LVIDd:         4.90 cm   Diastology LVIDs:  3.30 cm   LV e' medial:    6.64 cm/s LV PW:         1.10 cm   LV E/e' medial:  18.7 LV IVS:        1.20 cm   LV e' lateral:   9.68 cm/s LVOT diam:     1.90 cm   LV E/e' lateral: 12.8 LV SV:         94 LV SV Index:   39 LVOT Area:     2.84 cm  RIGHT VENTRICLE             IVC RV Basal diam:  3.10 cm     IVC diam: 2.20 cm RV S prime:     13.10 cm/s TAPSE (M-mode): 2.7 cm LEFT ATRIUM              Index        RIGHT ATRIUM           Index LA diam:        4.60 cm  1.90 cm/m   RA Area:     20.70 cm LA Vol (A2C):   118.0 ml 48.85 ml/m  RA Volume:   61.00 ml  25.25 ml/m LA Vol (A4C):   109.0 ml 45.13 ml/m LA Biplane Vol: 122.0 ml 50.51 ml/m  AORTIC VALVE AV Area (Vmax):    1.12 cm AV Area (Vmean):   1.03 cm AV Area (VTI):     1.16 cm AV Vmax:           358.00 cm/s AV Vmean:          265.000 cm/s AV VTI:            0.807 m AV Peak Grad:      51.3 mmHg AV Mean Grad:      31.0 mmHg LVOT Vmax:         141.50 cm/s LVOT Vmean:        96.600 cm/s LVOT VTI:          0.331 m LVOT/AV VTI ratio: 0.41  AORTA Ao Root diam: 2.70 cm Ao Asc diam:  3.60 cm MITRAL VALVE MV Area (PHT): 3.77 cm     SHUNTS MV Decel Time: 201 msec     Systemic VTI:  0.33 m MV E velocity: 124.00 cm/s  Systemic Diam: 1.90 cm MV A velocity: 117.00 cm/s MV E/A ratio:  1.06 Cherlynn Kaiser MD Electronically signed by Cherlynn Kaiser MD Signature Date/Time: 07/30/2022/1:34:33 PM    Final    CT Chest Wo Contrast  Result Date: 07/30/2022 CLINICAL DATA:  Respiratory illness, nondiagnostic xray EXAM: CT CHEST WITHOUT CONTRAST TECHNIQUE: Multidetector CT imaging of the chest was performed following the standard protocol without IV contrast. RADIATION DOSE REDUCTION: This exam was performed according to the departmental dose-optimization program which includes automated exposure  control, adjustment of the mA and/or kV according to patient size and/or use of iterative reconstruction technique. COMPARISON:  Chest x-ray today.  CT 01/28/2022 FINDINGS: Cardiovascular: Heart is normal size. Aorta normal caliber. Scattered coronary artery and aortic calcifications. Mediastinum/Nodes: Small scattered mediastinal lymph nodes. No axillary or visible hilar adenopathy. Lungs/Pleura: Calcified granuloma in the left upper lobe. Small left pleural effusion with compressive atelectasis in the left lower lobe. Linear areas of atelectasis or scarring in the upper lobes. Upper Abdomen: No acute findings Musculoskeletal: Chest wall soft tissues are unremarkable. No acute bony abnormality. IMPRESSION: Small left pleural effusion with left lower lobe atelectasis. Scattered areas  of atelectasis or scarring in the right lung and left upper lobe. Aortic Atherosclerosis (ICD10-I70.0). Electronically Signed   By: Rolm Baptise M.D.   On: 07/30/2022 00:04   DG Chest 1 View  Result Date: 07/29/2022 CLINICAL DATA:  Abnormal chest radiograph. EXAM: CHEST  1 VIEW COMPARISON:  Chest radiograph dated 07/19/2022. FINDINGS: Cardiomegaly with vascular congestion. No focal consolidation, pleural effusion, or pneumothorax. No acute osseous pathology. IMPRESSION: Cardiomegaly with vascular congestion. Electronically Signed   By: Anner Crete M.D.   On: 07/29/2022 20:55    Scheduled Meds:  Continuous Infusions:  heparin 2,000 Units/hr (07/30/22 0531)     LOS: 0 days     Alma Friendly, MD [facility]  If 7PM-7AM, please contact night-coverage [email or web address] 07/30/2022, 1:54 PM

## 2022-07-30 NOTE — ED Notes (Signed)
Pt complaining of sudden onset of chest pain, repeat 12 lead EKG taken and provider notified.

## 2022-07-30 NOTE — Progress Notes (Signed)
Spoke to phlebotomy unable to obtain lab 3 x sticks. MD notified

## 2022-07-30 NOTE — ED Notes (Signed)
Pt placed on O2 '@6L'$  via [place] to eat per request.

## 2022-07-30 NOTE — Progress Notes (Signed)
ANTICOAGULATION CONSULT NOTE - Initial Consult  Pharmacy Consult for heparin>>apixaban Indication: suspected pulmonary embolus  Allergies  Allergen Reactions   Iodinated Contrast Media Anaphylaxis   Penicillins Anaphylaxis    Patient tolerates cefepime (08/2017)   Insulin Lispro Other (See Comments)    Adder per [facility] from SNF   Minoxidil     Other reaction(s): hives    Patient Measurements: Height: '5\' 5"'$  (165.1 cm) Weight: (!) 145.2 kg (320 lb) IBW/kg (Calculated) : 57 Heparin Dosing Weight: 95kg  Vital Signs: Temp: 98.2 F (36.8 C) (10/30 1017) Temp Source: Oral (10/30 1017) BP: 106/81 (10/30 1017) Pulse Rate: 76 (10/30 1017)  Labs: Recent Labs    07/29/22 2122 07/29/22 2129 07/30/22 0201 07/30/22 0506 07/30/22 0755  HGB 8.8* 10.2*  9.9* 12.1  --   --   HCT 30.5* 30.0*  29.0* 41.2  --   --   PLT 212  --  146*  --   --   CREATININE 0.68 0.70 0.76  --   --   TROPONINIHS  --   --  '5 5 5     '$ Estimated Creatinine Clearance: 111.7 mL/min (by C-G formula based on SCr of 0.76 mg/dL).   Medical History: Past Medical History:  Diagnosis Date   Aortic stenosis    Echocardiogram 02/21/11:  Mean gradient 23 mm of mercury; peak gradient 36;; Turbulence noted in the area of the aortic isthmus with peak gradient 23 mmHg-consider MRI to assess for coarctation;    TEE: 04/02/11:  EF 55-60%, mod BAE, trileaflet AV with mild AS (pk and mean 25 and 16), mild AI, mild MR, mild TR, atrial septal aneurysm, no evidence of corarctation   Arthritis    "qwhere" (12/05/2017)   Asthma    Cervical cancer ([place]) 2006   CHF (congestive heart failure) (HCC)    Chronic lower back pain    Complication of anesthesia    "I have a hard time waking up from under it" (12/05/2017)   COPD (chronic obstructive pulmonary disease) ([place])    "lung dr said I don't have this" (12/05/2017)   Coronary artery disease    Diastolic congestive heart failure ([place])    Echo 02/21/11: EF 55-60%, moderate LVH, grade 2  diastolic dysfunction, mild to moderate aortic stenosis with a mean gradient 23 mm of mercury   GERD (gastroesophageal reflux disease)    Heart murmur    Hepatitis C    History of gout X 1   Hyperlipidemia    Hypertension    Hypotension arterial 04/07/2022   Hypothyroid    IBS (irritable bowel syndrome)    Migraine    "monthly" (12/05/2017)   Obesity hypoventilation syndrome ([place])    On home oxygen therapy    "2L all the time" (12/05/2017)   OSA treated with BiPAP    "have CPAP at home too; wearing BiPAP right now" (12/05/2017)   Pneumonia    "several times" (12/05/2017)   Type II diabetes mellitus ([place])     Assessment: 56yo female presents to ED after an abnormal CXR result, admitted for acute on chronic respiratory failure, found to have elevated D-dimer, to begin heparin while awaiting doppler and VQ scan; pt is on Eliquis PTA for Afib, last dose 10/29 am.  Pt had been on heparin earlier this year and required high rates for therapeutic levels.  V/Q came back neg. Ok to transition back to apixaban per Dr Marlowe Sax. Goal of Therapy:  Monitor platelets by anticoagulation protocol: Yes  Plan:  Dc heparin Apixaban '5mg'$  BID Rx will follow peripherally  Onnie Boer, PharmD, BCIDP, AAHIVP, CPP Infectious Disease Pharmacist 07/30/2022 7:26 PM

## 2022-07-30 NOTE — Progress Notes (Signed)
  Echocardiogram 2D Echocardiogram has been performed.  Johny Chess 07/30/2022, 12:03 PM

## 2022-07-30 NOTE — Progress Notes (Signed)
Patient with episode of chest pain -patient with +d-dimer in setting of Eliquis use  Awaiting V/Q/ dopplers prior to change in therapy  However with bed bound state, new hypoxemia  , soft bp  and new episode of chest pain . EKG poor tracing note PVC with ? Of  new inferior st depression.   Plan  -morphine  -repeat cardiac enzyme  -echo  -can not r/o PE-  start heparin while awaiting V/Q /dopplers  -de-escalate therapy based on results of  above testing.

## 2022-07-31 DIAGNOSIS — J9621 Acute and chronic respiratory failure with hypoxia: Secondary | ICD-10-CM | POA: Diagnosis not present

## 2022-07-31 LAB — GASTROINTESTINAL PANEL BY PCR, STOOL (REPLACES STOOL CULTURE)

## 2022-07-31 LAB — GLUCOSE, CAPILLARY
Glucose-Capillary: 128 mg/dL — ABNORMAL HIGH (ref 70–99)
Glucose-Capillary: 160 mg/dL — ABNORMAL HIGH (ref 70–99)

## 2022-07-31 LAB — BASIC METABOLIC PANEL
Anion gap: 14 (ref 5–15)
BUN: 18 mg/dL (ref 6–20)
CO2: 31 mmol/L (ref 22–32)
Calcium: 9.1 mg/dL (ref 8.9–10.3)
Chloride: 97 mmol/L — ABNORMAL LOW (ref 98–111)
Creatinine, Ser: 0.59 mg/dL (ref 0.44–1.00)
GFR, Estimated: >60 mL/min (ref >60)
Glucose, Bld: 145 mg/dL — ABNORMAL HIGH (ref 70–99)
Potassium: 4.7 mmol/L (ref 3.5–5.1)
Sodium: 142 mmol/L (ref 135–145)

## 2022-07-31 MED ORDER — GABAPENTIN 400 MG PO CAPS
400.0000 mg | ORAL_CAPSULE | Freq: Three times a day (TID) | ORAL | Status: DC
  Administered 2022-07-31 – 2022-08-03 (×10): 400 mg via ORAL
  Filled 2022-07-31 (×10): qty 1

## 2022-07-31 MED ORDER — LEVOTHYROXINE SODIUM 75 MCG PO TABS
150.0000 ug | ORAL_TABLET | Freq: Every day | ORAL | Status: DC
  Administered 2022-08-01 – 2022-08-03 (×3): 150 ug via ORAL
  Filled 2022-07-31 (×3): qty 2

## 2022-07-31 MED ORDER — FUROSEMIDE 10 MG/ML IJ SOLN
40.0000 mg | Freq: Two times a day (BID) | INTRAMUSCULAR | Status: DC
  Administered 2022-07-31 – 2022-08-02 (×5): 40 mg via INTRAVENOUS
  Filled 2022-07-31 (×6): qty 4

## 2022-07-31 MED ORDER — ALPRAZOLAM 0.5 MG PO TABS
0.5000 mg | ORAL_TABLET | Freq: Three times a day (TID) | ORAL | Status: DC | PRN
  Administered 2022-07-31 – 2022-08-01 (×3): 0.5 mg via ORAL
  Filled 2022-07-31 (×3): qty 1

## 2022-07-31 MED ORDER — SPIRONOLACTONE 12.5 MG HALF TABLET
12.5000 mg | ORAL_TABLET | Freq: Every day | ORAL | Status: DC
  Administered 2022-07-31 – 2022-08-03 (×4): 12.5 mg via ORAL
  Filled 2022-07-31 (×4): qty 1

## 2022-07-31 NOTE — Progress Notes (Addendum)
PROGRESS NOTE  Chelsea [name]:[id number] DOB: 06/04/1966 DOA: 07/29/2022 PCP: Rudene Anda, MD  HPI/Recap of past 24 hours: Chelsea [name] is a 56 y.o. female with medical history aortic stenosis, COPD/asthma, hx of tracheostomy, obesity hypoventilation syndrome/OSA now on bipap, chronic respiratory failure with hypoxia, morbid obesity, coronary disease, chronic diastolic CHF, GERD, hepatitis C, hyperlipidemia, hypertension, hypothyroidism, type 2 diabetes who has interim history of recent hospitalization 07/18/22-07/23/22 with discharge diagnosis of Upper GI bleed for which she was treated with 3 units of PRBC. Patient discharged to rehab and presented again to the ED due to increase O2 requirement from baseline 3 to 6 L.  In the ED, she complained about chest pain. Patient also reported issues with bipap which she states she has not used since leaving the hospital.  Due to her bedbound status, new hypoxemia, episode of chest pain, patient admitted to rule out PE and ACS.   Subjective: breathing is fair, lives at SNF, hasnt used BIPAP at facility in a while  Assessment/Plan:  Acute on chronic hypoxic respiratory failure  Acute on chronic diastolic CHF Baseline O2 3L, required up to 6 L, now improved to about 4 L ECHo with preserved EF, grade 2 DD, normal RV function and mildly dilated RV Positive D-dimer,  V/Q scan negative -start IV lasix, aldactone, poor candidate for SGLT2i with obesity and limited mobility Continue supplemental O2, BiPAP during naps and nighttime DuoNebs, inhalers   Morbid Obesity/hypoventilation syndrome /OSA Prior trach Continue BiPAP Lifestyle modification advised  Recent Upper GI bleed EGD showed small hiatal hernia and irregular Z-line.  Biopsies performed for Barrett's esophagus -continue PPI   P. Afib  Heart rate controlled Continue on Eliquis   Neuropathy /chronic pain syndrome  Resume home regimen    Hypothyrodism Continue synthroid     GERD Continue PPI  Neuropathy of lower extremities/chronic pain syndrome: Continue gabapentin.  Complains of pain everywhere.  Has history of chronic pain syndrome and takes hydrocodone   Chronic debility  Admitted from SNF PT/OT Likely to DC to SNF to continue rehab    Estimated body mass index is 53.38 kg/m as calculated from the following:   Height as of this encounter: _0  (1.651 m).   Weight as of this encounter: 145.5 kg.    DVt prolph: eliquis  Code Status: Full  Family Communication: None at bedside  Disposition Plan: Status is: Inpatient Remains inpatient appropriate because: Level of care  Consultants: None  Procedures: None  Antimicrobials: None  DVT prophylaxis: Heparin drip   Objective: Vitals:   07/30/22 1017 07/30/22 1938 07/31/22 0033 07/31/22 0436  BP: 106/81 101/67 96/65 112/68  Pulse: 76 73 73 65  Resp: _1 Temp: 98.2 F (36.8 C) 98.5 F (36.9 C) 98.5 F (36.9 C) 97.8 F (36.6 C)  TempSrc: Oral Oral Oral Oral  SpO2: 90% 90% 90% 94%  Weight:   (!) 145.5 kg   Height: _2  (1.651 m)       Intake/Output Summary (Last 24 hours) at 07/31/2022 5009 Last data filed at 07/30/2022 1944 Gross per 24 hour  Intake 545.51 ml  Output 400 ml  Net 145.51 ml   Filed Weights   07/29/22 2020 07/31/22 0033  Weight: (!) 145.2 kg (!) 145.5 kg    Exam: General: Obese chronically ill female sitting up in bed, AAOx3, no distress HEENT: Neck obese unable to assess JVD CVS: S1-S2, regular rhythm Lungs: Decreased breath sounds to bases Abdomen: Soft, obese,  nontender, bowel sounds present  Musculoskeletal: Trace bilateral pedal edema noted, venous stasis changes on bilateral lower extremity Skin: Above Psychiatry: Normal mood     Data Reviewed: CBC: Recent Labs  Lab 07/29/22 2122 07/29/22 2129 07/30/22 0201  WBC 5.4  --  3.9*  NEUTROABS 2.9  --   --   HGB 8.8* 10.2*  9.9* 12.1  HCT 30.5* 30.0*  29.0* 41.2  MCV 92.7  --   90.7  PLT 212  --  518*   Basic Metabolic Panel: Recent Labs  Lab 07/29/22 2122 07/29/22 2129 07/30/22 0201 07/30/22 0755 07/31/22 0042  NA 142 139  139 141  --  142  K 3.8 3.9  3.8 3.6  --  4.7  CL 96* 94* 95*  --  97*  CO2 38*  --  36*  --  31  GLUCOSE 163* 168* 104*  --  145*  BUN _0 --  18  CREATININE 0.68 0.70 0.76  --  0.59  CALCIUM 8.2*  --  8.4*  --  9.1  MG 1.5*  --   --  1.9  --    GFR: Estimated Creatinine Clearance: 111.8 mL/min (by C-G formula based on SCr of 0.59 mg/dL). Liver Function Tests: Recent Labs  Lab 07/29/22 2122 07/30/22 0201  AST 16 14*  ALT 9 9  ALKPHOS 70 69  BILITOT 0.3 0.5  PROT 6.0* 6.1*  ALBUMIN 2.9* 2.8*   No results for input(s): "LIPASE", "AMYLASE" in the last 168 hours. No results for input(s): "AMMONIA" in the last 168 hours. Coagulation Profile: No results for input(s): "INR", "PROTIME" in the last 168 hours. Cardiac Enzymes: No results for input(s): "CKTOTAL", "CKMB", "CKMBINDEX", "TROPONINI" in the last 168 hours. BNP (last 3 results) No results for input(s): "PROBNP" in the last 8760 hours. HbA1C: Recent Labs    07/30/22 0201  HGBA1C 5.2   CBG: Recent Labs  Lab 07/30/22 0750  GLUCAP 123*   Lipid Profile: No results for input(s): "CHOL", "HDL", "LDLCALC", "TRIG", "CHOLHDL", "LDLDIRECT" in the last 72 hours. Thyroid Function Tests: Recent Labs    07/30/22 0201  TSH 1.428   Anemia Panel: No results for input(s): "VITAMINB12", "FOLATE", "FERRITIN", "TIBC", "IRON", "RETICCTPCT" in the last 72 hours. Urine analysis:    Component Value Date/Time   COLORURINE STRAW (A) 07/04/2022 2300   APPEARANCEUR CLEAR 07/04/2022 2300   LABSPEC 1.008 07/04/2022 2300   PHURINE 6.0 07/04/2022 2300   GLUCOSEU NEGATIVE 07/04/2022 2300   HGBUR NEGATIVE 07/04/2022 2300   BILIRUBINUR NEGATIVE 07/04/2022 2300   KETONESUR NEGATIVE 07/04/2022 2300   PROTEINUR NEGATIVE 07/04/2022 2300   UROBILINOGEN 0.2 12/08/2010 0545    NITRITE NEGATIVE 07/04/2022 2300   LEUKOCYTESUR NEGATIVE 07/04/2022 2300   Sepsis Labs: _1 (procalcitonin:4,lacticidven:4)  )No results found for this or any previous visit (from the past 240 hour(s)).    Studies: VAS [place] LOWER EXTREMITY VENOUS (DVT)  Result Date: 07/30/2022  Lower Venous DVT Study Patient Name:  JURNIE GARRITANO  Date of Exam:   07/30/2022 Medical Rec #: [id number]             Accession #:    [id number] Date of Birth: September 16, 1966             Patient Gender: F Patient Age:   37 years Exam Location:  [facility] Procedure:      VAS [place] LOWER EXTREMITY VENOUS (DVT) Referring Phys: Myles Rosenthal --------------------------------------------------------------------------------  Indications: SOB, and Swelling.  Comparison Study:  No prior study Performing Technologist: Sharion Dove RVS  Examination Guidelines: A complete evaluation includes B-mode imaging, spectral Doppler, color Doppler, and power Doppler as needed of all accessible portions of each vessel. Bilateral testing is considered an integral part of a complete examination. Limited examinations for reoccurring indications may be performed as noted. The reflux portion of the exam is performed with the patient in reverse Trendelenburg.  +---------+---------------+---------+-----------+----------+-------------------+ RIGHT    CompressibilityPhasicitySpontaneityPropertiesThrombus Aging      +---------+---------------+---------+-----------+----------+-------------------+ CFV      Full           Yes      Yes                                      +---------+---------------+---------+-----------+----------+-------------------+ SFJ      Full                                                             +---------+---------------+---------+-----------+----------+-------------------+ FV Prox  Full           Yes      Yes                                       +---------+---------------+---------+-----------+----------+-------------------+ FV Mid                  Yes      Yes                                      +---------+---------------+---------+-----------+----------+-------------------+ FV DistalFull                                                             +---------+---------------+---------+-----------+----------+-------------------+ PFV      Full                                                             +---------+---------------+---------+-----------+----------+-------------------+ POP      Full           Yes      Yes                                      +---------+---------------+---------+-----------+----------+-------------------+ PTV                                                   Not well visualized +---------+---------------+---------+-----------+----------+-------------------+ PERO  Not well visualized +---------+---------------+---------+-----------+----------+-------------------+   +---------+---------------+---------+-----------+----------+-------------------+ LEFT     CompressibilityPhasicitySpontaneityPropertiesThrombus Aging      +---------+---------------+---------+-----------+----------+-------------------+ CFV      Full           Yes      Yes                                      +---------+---------------+---------+-----------+----------+-------------------+ SFJ      Full                                                             +---------+---------------+---------+-----------+----------+-------------------+ FV Prox                 Yes      Yes                                      +---------+---------------+---------+-----------+----------+-------------------+ FV Mid   Full                                                             +---------+---------------+---------+-----------+----------+-------------------+ FV  DistalFull                                                             +---------+---------------+---------+-----------+----------+-------------------+ PFV                                                   Not well visualized +---------+---------------+---------+-----------+----------+-------------------+ POP      Full                                                             +---------+---------------+---------+-----------+----------+-------------------+ PTV                                                   Not well visualized +---------+---------------+---------+-----------+----------+-------------------+ PERO                                                  Not well visualized +---------+---------------+---------+-----------+----------+-------------------+    Summary: RIGHT: - There is no obvious evidence of deep vein thrombosis in the visualized veins of the lower extremity. However, portions  of this examination were limited- see technologist comments above.  LEFT: - There is no obvious evidence of deep vein thrombosis in the visualized veins of the lower extremity. However, portions of this examination were limited- see technologist comments above.  *See table(s) above for measurements and observations.    Preliminary    NM Pulmonary Perfusion  Result Date: 07/30/2022 CLINICAL DATA:  Chronic respiratory failure with hypoxia, increased oxygen requirement, chest pain, question pulmonary embolism. History COPD/asthma, tracheostomy, obesity hypoventilation syndrome, aortic stenosis, type II diabetes mellitus, hypertension, hepatitis C EXAM: NUCLEAR MEDICINE PERFUSION LUNG SCAN TECHNIQUE: Perfusion images were obtained in multiple projections after intravenous injection of radiopharmaceutical. Ventilation scans intentionally deferred if perfusion scan and chest x-ray adequate for interpretation during COVID 19 epidemic. RADIOPHARMACEUTICALS:  3.9 mCi Tc-77mMAA IV COMPARISON:   Chest CT and chest radiographs of 07/29/2022 FINDINGS: Enlargement of cardiac silhouette. Small LEFT pleural effusion. No met segmental or subsegmental perfusion defects to suggest pulmonary embolism. IMPRESSION: Pulmonary embolism absent Electronically Signed   By: MLavonia DanaM.D.   On: 07/30/2022 15:08   ECHOCARDIOGRAM COMPLETE  Result Date: 07/30/2022    ECHOCARDIOGRAM REPORT   Patient Name:   JJAELEY WIKERDate of Exam: 07/30/2022 Medical Rec #:  [id number]           Height:       65.0 in Accession #:    [id number]          Weight:       320.0 lb Date of Birth:  [birth year]           BSA:          2.415 m Patient Age:    89years             BP:           106/81 mmHg Patient Gender: F                    HR:           70 bpm. Exam Location:  Inpatient Procedure: 2D Echo, Color Doppler and Cardiac Doppler Indications:    chest pain  History:        Patient has prior history of Echocardiogram examinations, most                 recent 06/12/2022. CHF; Risk Factors:Sleep Apnea, Diabetes,                 Hypertension and Dyslipidemia.  Sonographer:    LJohny ChessRDCS Referring Phys: [id number]-MAIZ A THOMAS  Sonographer Comments: Patient is obese. Image acquisition challenging due to patient body habitus. IMPRESSIONS  1. Left ventricular ejection fraction, by estimation, is 60 to 65%. The left ventricle has normal function. The left ventricle has no regional wall motion abnormalities. There is mild left ventricular hypertrophy. Left ventricular diastolic parameters are consistent with Grade II diastolic dysfunction (pseudonormalization). Elevated left ventricular end-diastolic pressure.  2. Right ventricular systolic function is normal. The right ventricular size is mildly enlarged. Tricuspid regurgitation signal is inadequate for assessing PA pressure.  3. Left atrial size was severely dilated.  4. The mitral valve is degenerative. Trivial mitral valve regurgitation. No evidence of mitral  stenosis.  5. The aortic valve is abnormal. Unable to determine aortic valve morphology due to image quality. There is moderate calcification of the aortic valve. Aortic valve regurgitation is trivial. Moderate aortic valve stenosis. Aortic valve area, by VTI measures  1.16 cm. Aortic valve mean gradient measures 31.0 mmHg. Aortic valve Vmax measures 3.58 m/s.  6. The inferior vena cava is dilated in size with >50% respiratory variability, suggesting right atrial pressure of 8 mmHg.  7. Increased flow velocities may be secondary to anemia, thyrotoxicosis, hyperdynamic or high flow state. FINDINGS  Left Ventricle: Left ventricular ejection fraction, by estimation, is 60 to 65%. The left ventricle has normal function. The left ventricle has no regional wall motion abnormalities. The left ventricular internal cavity size was normal in size. There is  mild left ventricular hypertrophy. Left ventricular diastolic parameters are consistent with Grade II diastolic dysfunction (pseudonormalization). Elevated left ventricular end-diastolic pressure. Right Ventricle: The right ventricular size is mildly enlarged. No increase in right ventricular wall thickness. Right ventricular systolic function is normal. Tricuspid regurgitation signal is inadequate for assessing PA pressure. Left Atrium: Left atrial size was severely dilated. Right Atrium: Right atrial size was normal in size. Pericardium: There is no evidence of pericardial effusion. Mitral Valve: The mitral valve is degenerative in appearance. Mild mitral annular calcification. Trivial mitral valve regurgitation. No evidence of mitral valve stenosis. Tricuspid Valve: The tricuspid valve is normal in structure. Tricuspid valve regurgitation is trivial. No evidence of tricuspid stenosis. Aortic Valve: The aortic valve is abnormal. There is moderate calcification of the aortic valve. Aortic valve regurgitation is trivial. Moderate aortic stenosis is present. Aortic valve  mean gradient measures 31.0 mmHg. Aortic valve peak gradient measures 51.3 mmHg. Aortic valve area, by VTI measures 1.16 cm. Pulmonic Valve: The pulmonic valve was normal in structure. Pulmonic valve regurgitation is trivial. No evidence of pulmonic stenosis. Aorta: The aortic root is normal in size and structure. Venous: The inferior vena cava is dilated in size with greater than 50% respiratory variability, suggesting right atrial pressure of 8 mmHg. IAS/Shunts: The atrial septum is grossly normal.  LEFT VENTRICLE PLAX 2D LVIDd:         4.90 cm   Diastology LVIDs:         3.30 cm   LV e' medial:    6.64 cm/s LV PW:         1.10 cm   LV E/e' medial:  18.7 LV IVS:        1.20 cm   LV e' lateral:   9.68 cm/s LVOT diam:     1.90 cm   LV E/e' lateral: 12.8 LV SV:         94 LV SV Index:   39 LVOT Area:     2.84 cm  RIGHT VENTRICLE             IVC RV Basal diam:  3.10 cm     IVC diam: 2.20 cm RV S prime:     13.10 cm/s TAPSE (M-mode): 2.7 cm LEFT ATRIUM              Index        RIGHT ATRIUM           Index LA diam:        4.60 cm  1.90 cm/m   RA Area:     20.70 cm LA Vol (A2C):   118.0 ml 48.85 ml/m  RA Volume:   61.00 ml  25.25 ml/m LA Vol (A4C):   109.0 ml 45.13 ml/m LA Biplane Vol: 122.0 ml 50.51 ml/m  AORTIC VALVE AV Area (Vmax):    1.12 cm AV Area (Vmean):   1.03 cm AV Area (VTI):  1.16 cm AV Vmax:           358.00 cm/s AV Vmean:          265.000 cm/s AV VTI:            0.807 m AV Peak Grad:      51.3 mmHg AV Mean Grad:      31.0 mmHg LVOT Vmax:         141.50 cm/s LVOT Vmean:        96.600 cm/s LVOT VTI:          0.331 m LVOT/AV VTI ratio: 0.41  AORTA Ao Root diam: 2.70 cm Ao Asc diam:  3.60 cm MITRAL VALVE MV Area (PHT): 3.77 cm     SHUNTS MV Decel Time: 201 msec     Systemic VTI:  0.33 m MV E velocity: 124.00 cm/s  Systemic Diam: 1.90 cm MV A velocity: 117.00 cm/s MV E/A ratio:  1.06 Cherlynn Kaiser MD Electronically signed by Cherlynn Kaiser MD Signature Date/Time: 07/30/2022/1:34:33 PM    Final      Scheduled Meds:  apixaban  5 mg Oral BID    Continuous Infusions:     LOS: 1 day     Domenic Polite, MD [facility]  If 7PM-7AM, please contact night-coverage [email or web address] 07/31/2022, 5:27 AM

## 2022-07-31 NOTE — Evaluation (Signed)
Physical Therapy Evaluation Patient Details Name: Chelsea [name] MRN: [id number] DOB: November 09, 1965 Today's Date: 07/31/2022  History of Present Illness  56 y.o. female with medical history aortic stenosis, COPD/asthma, hx of tracheostomy, obesity hypoventilation syndrome/OSA now on bipap, chronic respiratory failure with hypoxia, morbid obesity, coronary disease, chronic diastolic CHF, GERD, hepatitis C, hyperlipidemia, hypertension, hypothyroidism, type 2 diabetes. Admitted 10/29 for Acute on chronic hypoxic respiratory failure   Acute on chronic diastolic CHF.  Clinical Impression  Pt admitted with above diagnosis. PTA pt reports working with rehab attempting to stand in parallel bars but used hoyer lift for transfers. Required +2 Max assist for bed mobility today, however did well sitting on EOB working on weight shifting for pressure relief and to fasten hoyer pad for transfer. Pt currently with functional limitations due to the deficits listed below (see PT Problem List). Pt will benefit from skilled PT to increase their independence and safety with mobility to allow discharge to the venue listed below.          Recommendations for follow up therapy are one component of a multi-disciplinary discharge planning process, led by the attending physician.  Recommendations may be updated based on patient status, additional functional criteria and insurance authorization.  Follow Up Recommendations Skilled nursing-short term rehab (<3 hours/day) Can patient physically be transported by private vehicle: No    Assistance Recommended at Discharge Frequent or constant Supervision/Assistance  Patient can return home with the following  Two people to help with walking and/or transfers;Two people to help with bathing/dressing/bathroom;Assistance with cooking/housework;Assist for transportation;Help with stairs or ramp for entrance    Equipment Recommendations None recommended by PT  Recommendations  for Other Services       Functional Status Assessment Patient has had a recent decline in their functional status and demonstrates the ability to make significant improvements in function in a reasonable and predictable amount of time. (pt reports she had more independence with bed mobility prior to this admission. Was working on standing up)     Precautions / Restrictions Precautions Precautions: Fall Restrictions Weight Bearing Restrictions: No      Mobility  Bed Mobility Overal bed mobility: Needs Assistance Bed Mobility: Rolling, Sidelying to Sit Rolling: Max assist, +2 for physical assistance Sidelying to sit: Max assist, +2 for physical assistance, HOB elevated       General bed mobility comments: Max assist +2 to roll onto Lt shoulder for pericare and transfer to EOB; Pt able to move RLE over LLE and reach for rail, but unable to reposition herself in middle of bed without +2 assist. Transitioned to EOB with VC for LE placement and pulling through PTs hand, trunk support from OT in back.    Transfers                   General transfer comment: hoyer    Ambulation/Gait                  Stairs            Wheelchair Mobility    Modified Rankin (Stroke Patients Only)       Balance Overall balance assessment: Needs assistance Sitting-balance support: No upper extremity supported, Feet unsupported Sitting balance-Leahy Scale: Fair Sitting balance - Comments: Fearful of falling forward but progressed seated balance with weight shifting Lt and Rt, needs support to lean backwards. Simultaneously placed hoyer pad while weight shifting. Needs some support to elevated LEs. but cleared buttocks with leaning. Postural control: Posterior  lean                                   Pertinent Vitals/Pain Pain Assessment Pain Assessment: Faces Faces Pain Scale: Hurts even more Pain Location: Rt knee, LUE Pain Descriptors / Indicators: Aching,  Guarding, Grimacing Pain Intervention(s): Monitored during session, Repositioned    Home Living Family/patient expects to be discharged to:: Skilled nursing facility                   Additional Comments: From SNF PTA    Prior Function Prior Level of Function : Needs assist       Physical Assist : Mobility (physical);ADLs (physical) Mobility (physical): Bed mobility;Transfers   Mobility Comments: States she has been working with rehab at [facility]. Using hoyer lift to get OOB - working on standing in // bars but denies ability to fully stand upright yet. ADLs Comments: Pt requires assistance with bathing, dressing, hygiene tasks, IADLs     Hand Dominance   Dominant Hand: Right    Extremity/Trunk Assessment   Upper Extremity Assessment Upper Extremity Assessment: Defer to OT evaluation    Lower Extremity Assessment Lower Extremity Assessment: Generalized weakness RLE Sensation: decreased light touch;history of peripheral neuropathy LLE Sensation: decreased light touch;history of peripheral neuropathy    Cervical / Trunk Assessment Cervical / Trunk Exceptions: morbid obesity  Communication   Communication: No difficulties  Cognition Arousal/Alertness: Awake/alert Behavior During Therapy: WFL for tasks assessed/performed Overall Cognitive Status: Within Functional Limits for tasks assessed                                 General Comments: Required motivation to participate, uncomfortable due to frequent diarrhea, but none present during inspection        General Comments General comments (skin integrity, edema, etc.): 5L HFNC SpO2 94% avg during session    Exercises     Assessment/Plan    PT Assessment Patient needs continued PT services  PT Problem List Decreased strength;Decreased range of motion;Decreased activity tolerance;Decreased balance;Decreased mobility;Cardiopulmonary status limiting activity;Obesity       PT Treatment Interventions  DME instruction;Functional mobility training;Therapeutic activities;Therapeutic exercise;Balance training;Neuromuscular re-education;Patient/family education;Wheelchair mobility training    PT Goals (Current goals can be found in the Care Plan section)  Acute Rehab PT Goals Patient Stated Goal: Get back to rehab and be able to stand. PT Goal Formulation: With patient Time For Goal Achievement: 08/14/22 Potential to Achieve Goals: Poor    Frequency Min 2X/week     Co-evaluation PT/OT/SLP Co-Evaluation/Treatment: Yes Reason for Co-Treatment: Complexity of the patient's impairments (multi-system involvement);For patient/therapist safety;To address functional/ADL transfers PT goals addressed during session: Mobility/safety with mobility;Balance         AM-PAC PT "6 Clicks" Mobility  Outcome Measure Help needed turning from your back to your side while in a flat bed without using bedrails?: Total Help needed moving from lying on your back to sitting on the side of a flat bed without using bedrails?: Total Help needed moving to and from a bed to a chair (including a wheelchair)?: Total Help needed standing up from a chair using your arms (e.g., wheelchair or bedside chair)?: Total Help needed to walk in hospital room?: Total Help needed climbing 3-5 steps with a railing? : Total 6 Click Score: 6    End of Session Equipment Utilized During Treatment:  Oxygen Activity Tolerance: Patient tolerated treatment well Patient left: with call bell/phone within reach;in chair;with chair alarm set Nurse Communication: Mobility status;Need for lift equipment (hoyer pad in place for return to bed.) PT Visit Diagnosis: Muscle weakness (generalized) (M62.81);Difficulty in walking, not elsewhere classified (R26.2)    Time: 6761-9509 PT Time Calculation (min) (ACUTE ONLY): 45 min   Charges:   PT Evaluation $PT Eval Moderate Complexity: 1 Mod PT Treatments $Therapeutic Activity: 8-22 mins         Candie Mile, PT, DPT Physical Therapist Acute Rehabilitation Services [place] 07/31/2022, 11:20 AM

## 2022-07-31 NOTE — Evaluation (Signed)
Occupational Therapy Evaluation Patient Details Name: Chelsea [name] MRN: [id number] DOB: 03/04/1966 Today's Date: 07/31/2022   History of Present Illness 56 y.o. female with medical history aortic stenosis, COPD/asthma, hx of tracheostomy, obesity hypoventilation syndrome/OSA now on bipap, chronic respiratory failure with hypoxia, morbid obesity, coronary disease, chronic diastolic CHF, GERD, hepatitis C, hyperlipidemia, hypertension, hypothyroidism, type 2 diabetes. Admitted 10/29 for Acute on chronic hypoxic respiratory failure   Acute on chronic diastolic CHF.   Clinical Impression   Pt dependent at baseline for ADLs, reports sitting EOB at SNF, but mostly uses lift to get OOB to chair. Pt currently needing min-total A for ADLs, max A +2 for bed mobility, and transferred to chair with use of maximove. Pt with increased anxiety regarding mobility and fearful of falling. Pt presenting with impairments listed below, will follow acutely. Recommend SNF at d/c.      Recommendations for follow up therapy are one component of a multi-disciplinary discharge planning process, led by the attending physician.  Recommendations may be updated based on patient status, additional functional criteria and insurance authorization.   Follow Up Recommendations  Skilled nursing-short term rehab (<3 hours/day)    Assistance Recommended at Discharge Frequent or constant Supervision/Assistance  Patient can return home with the following Two people to help with walking and/or transfers;A lot of help with bathing/dressing/bathroom    Functional Status Assessment  Patient has had a recent decline in their functional status and demonstrates the ability to make significant improvements in function in a reasonable and predictable amount of time.  Equipment Recommendations  None recommended by OT (defer)    Recommendations for Other Services PT consult     Precautions / Restrictions  Precautions Precautions: Fall Precaution Comments: 4L O2 at baseline Restrictions Weight Bearing Restrictions: No      Mobility Bed Mobility Overal bed mobility: Needs Assistance Bed Mobility: Rolling, Sidelying to Sit Rolling: Max assist, +2 for physical assistance Sidelying to sit: Max assist, +2 for physical assistance, HOB elevated            Transfers Overall transfer level: Needs assistance Equipment used: Ambulation equipment used Transfers: Bed to chair/wheelchair/BSC             General transfer comment: placed maximove pad under pt in seated position, pt able to weightshift R/L x4 for pad placement Transfer via Lift Equipment: Maximove    Balance Overall balance assessment: Needs assistance Sitting-balance support: No upper extremity supported, Feet unsupported Sitting balance-Leahy Scale: Fair Sitting balance - Comments: Fearful of falling forward but progressed seated balance with weight shifting Lt and Rt, needs support to lean backwards. Simultaneously placed hoyer pad while weight shifting. Needs some support to elevated LEs. but cleared buttocks with leaning.                                   ADL either performed or assessed with clinical judgement   ADL Overall ADL's : Needs assistance/impaired Eating/Feeding: Set up   Grooming: Minimal assistance   Upper Body Bathing: Maximal assistance   Lower Body Bathing: Maximal assistance   Upper Body Dressing : Maximal assistance   Lower Body Dressing: Maximal assistance   Toilet Transfer: Total assistance   Toileting- Clothing Manipulation and Hygiene: Total assistance       Functional mobility during ADLs: Maximal assistance;+2 for physical assistance       Vision   Vision Assessment?: No apparent visual deficits  Perception     Praxis      Pertinent Vitals/Pain Pain Assessment Pain Assessment: Faces Pain Score: 6  Faces Pain Scale: Hurts even more Pain Location:  Rt knee, LUE Pain Descriptors / Indicators: Aching, Guarding, Grimacing Pain Intervention(s): Limited activity within patient's tolerance     Hand Dominance Right   Extremity/Trunk Assessment Upper Extremity Assessment Upper Extremity Assessment: LUE deficits/detail LUE Deficits / Details: reports degenerative arthritis in LUE, does not use functionally, keeps in guarded position LUE Coordination: decreased fine motor;decreased gross motor   Lower Extremity Assessment Lower Extremity Assessment: Defer to PT evaluation RLE Sensation: decreased light touch;history of peripheral neuropathy LLE Sensation: decreased light touch;history of peripheral neuropathy   Cervical / Trunk Assessment Cervical / Trunk Assessment: Other exceptions Cervical / Trunk Exceptions: body habitus   Communication Communication Communication: No difficulties   Cognition Arousal/Alertness: Awake/alert Behavior During Therapy: WFL for tasks assessed/performed, Anxious Overall Cognitive Status: Within Functional Limits for tasks assessed                                 General Comments: Required motivation to participate, uncomfortable due to frequent diarrhea, but none present during inspection     General Comments  VSS on 5L HFNC    Exercises     Shoulder Instructions      Home Living Family/patient expects to be discharged to:: Skilled nursing facility                                 Additional Comments: From SNF PTA      Prior Functioning/Environment Prior Level of Function : Needs assist       Physical Assist : Mobility (physical);ADLs (physical) Mobility (physical): Bed mobility;Transfers   Mobility Comments: States she has been working with rehab at [facility]. Using hoyer lift to get OOB - working on standing in // bars but denies ability to fully stand upright yet. ADLs Comments: Pt requires assistance with bathing, dressing, hygiene tasks, IADLs        OT  Problem List: Decreased strength;Decreased range of motion;Decreased activity tolerance;Impaired balance (sitting and/or standing);Decreased cognition;Decreased safety awareness      OT Treatment/Interventions: Self-care/ADL training;Therapeutic exercise;DME and/or AE instruction;Therapeutic activities;Patient/family education;Balance training    OT Goals(Current goals can be found in the care plan section) Acute Rehab OT Goals Patient Stated Goal: none stated OT Goal Formulation: With patient Time For Goal Achievement: 08/14/22 Potential to Achieve Goals: Good ADL Goals Pt Will Perform Grooming: with modified independence;sitting Pt Will Perform Upper Body Dressing: with modified independence;sitting;standing Pt Will Transfer to Toilet: with transfer board;with mod assist;with +2 assist;bedside commode  OT Frequency: Min 2X/week    Co-evaluation PT/OT/SLP Co-Evaluation/Treatment: Yes Reason for Co-Treatment: Complexity of the patient's impairments (multi-system involvement);For patient/therapist safety;To address functional/ADL transfers PT goals addressed during session: Mobility/safety with mobility;Balance OT goals addressed during session: ADL's and self-care      AM-PAC OT "6 Clicks" Daily Activity     Outcome Measure Help from another person eating meals?: None Help from another person taking care of personal grooming?: A Little Help from another person toileting, which includes using toliet, bedpan, or urinal?: Total Help from another person bathing (including washing, rinsing, drying)?: A Lot Help from another person to put on and taking off regular upper body clothing?: A Lot Help from another person to put on and taking  off regular lower body clothing?: Total 6 Click Score: 13   End of Session Equipment Utilized During Treatment: Oxygen;Other (comment) Scientist, forensic) Nurse Communication: Mobility status  Activity Tolerance: Patient tolerated treatment well Patient left: in  chair;with call bell/phone within reach;with chair alarm set  OT Visit Diagnosis: Unsteadiness on feet (R26.81);Other abnormalities of gait and mobility (R26.89);Muscle weakness (generalized) (M62.81)                Time: 4431-5400 OT Time Calculation (min): 42 min Charges:  OT General Charges $OT Visit: 1 Visit OT Evaluation $OT Eval Moderate Complexity: 1 [address] OTD, OTR/L Acute Rehab [phone]) 832 - [place] 07/31/2022, 12:51 PM

## 2022-07-31 NOTE — Progress Notes (Signed)
RT went to place patient on BiPAP machine.  Patient requested RT come back around 0100.  RT will follow up around 0100.

## 2022-07-31 NOTE — TOC Initial Note (Signed)
Transition of Care [facility]) - Initial/Assessment Note    Patient Details  Name: Chelsea [name] MRN: [id number] Date of Birth: [birth year]  [facility]) CM/SW Contact:    Bethann Berkshire, [place] Phone Number: 07/31/2022, 12:20 PM  Clinical Narrative:                  CSW confirmed with [facility] that pt is short term there. TOC will follow to assist with DC needs.   Expected Discharge Plan: Skilled Nursing Facility Barriers to Discharge: Continued Medical Work up   Patient Goals and CMS Choice        Expected Discharge Plan and Services Expected Discharge Plan: [place] Acute Care Choice: [place] Living arrangements for the past 2 months: [place]                                      Prior Living Arrangements/Services Living arrangements for the past 2 months: [place] Lives with:: Facility Resident Patient language and need for interpreter reviewed:: Yes              Criminal Activity/Legal Involvement Pertinent to Current Situation/Hospitalization: No - Comment as needed  Activities of Daily Living      Permission Sought/Granted                  Emotional Assessment              Admission diagnosis:  CHF (congestive heart failure) ([place]) [I50.9] CHF exacerbation ([place]) [I50.9] Acute on chronic respiratory failure with hypoxia ([place]) [J96.21] Patient Active Problem List   Diagnosis Date Noted   CHF exacerbation ([place]) 07/30/2022   CHF (congestive heart failure) ([place]) 07/30/2022   Upper GI bleed 07/18/2022   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   Acute blood loss anemia 07/18/2022   Acute on chronic hypoxic respiratory failure ([place]) 07/04/2022   Bleeding of the respiratory tract 04/07/2022   Acute on chronic respiratory failure with hypoxia and hypercapnia ([place]) 04/07/2022   Pulmonary edema, acute ([place]) 02/21/2022    Restrictive lung disease secondary to obesity 01/28/2022   Symptomatic anemia 01/04/2022   Limitation of activity due to disability 09/18/2021   Paroxysmal atrial fibrillation ([place]) 09/04/2021   DJD (degenerative joint disease) 07/03/2021   Venous insufficiency of both lower extremities 07/02/2021   Physical deconditioning 04/24/2021   Depression 04/20/2021   History of tobacco use 04/20/2021   Hyperglycemia due to type 2 diabetes mellitus ([place]) 04/20/2021   Irritable bowel syndrome with constipation 04/20/2021   Microalbuminuria 04/20/2021   On supplemental oxygen by nasal cannula 04/20/2021   Renal insufficiency 04/20/2021   Heart failure ([place]) 04/20/2021   Moderate aortic stenosis 04/20/2021   Mild tricuspid regurgitation 04/02/2021   Mitral valve sclerosis 04/02/2021   Morbid obesity with BMI of 50.0-59.9, adult ([place]) 04/02/2021   Non-smoker 04/02/2021   Awaiting admission to adequate facility elsewhere 35/00/9381   Metabolic alkalosis 82/99/3716   Restless leg syndrome 03/22/2021   Weakness 03/20/2021   Acute congestive heart failure ([place]) 03/18/2021   Hx of atrial flutter 03/15/2021   Weight loss 03/15/2021   Atrial flutter ([place]) 03/09/2021   S/P hysterectomy 02/19/2021   Pulmonary nodule less than 6 cm determined by computed tomography of lung 02/09/2021   Dyspnea on exertion 02/09/2021   Nocturnal hypoxemia 06/03/2020  Mixed hyperlipidemia 04/18/2020   Hair loss 09/17/2019   Chronic pain of multiple joints 06/18/2019   Irritable bowel syndrome with diarrhea 06/18/2019   Onychomycosis 06/18/2019   Acute idiopathic gout of left hand 02/16/2019   Tinea pedis of both feet 11/12/2018   Hypertension associated with diabetes ([place]) 08/25/2018   Class 3 severe obesity with serious comorbidity and body mass index (BMI) of 50.0 to 59.9 in adult ([place]) 08/25/2018   Class 3 severe obesity due to excess calories with serious comorbidity and body mass index (BMI) of 60.0 to 69.9 in  adult ([place]) 08/25/2018   Chronic respiratory failure with hypoxia ([place]) 07/02/2018   Physical debility 05/10/2018   Hepatitis C    Neuropathy due to type 2 diabetes mellitus ([place]) 02/24/2018   Chronic heart failure with preserved ejection fraction ([place]) 02/23/2018   Dyspnea 12/05/2017   Acute on chronic diastolic CHF (congestive heart failure), NYHA class 2 ([place]) 09/25/2017   Pneumonia due to gram-positive bacteria 08/24/2017   Acute respiratory failure with hypoxia and hypercarbia ([place]) 05/27/2016   Hyperlipidemia 09/28/2015   Chronic obstructive pulmonary disease, unspecified ([place]) 03/04/2015   Bronchitis 05/30/2011   Cellulitis, leg 04/29/2011   Neuropathic pain, leg 04/09/2011   Mononeuropathy of lower extremity 04/09/2011   Edema 03/22/2011   Morbid obesity ([place]) 03/22/2011   History of cervical cancer 03/22/2011   Lymphedema of both lower extremities 03/22/2011   Morbid (severe) obesity due to excess calories ([place]) 03/22/2011   Ankle pain, left 03/14/2011   Obesity hypoventilation syndrome ([place]) 01/01/2011   Osteoarthritis 11/28/2010   Chronic diastolic heart failure ([place]) 11/14/2010   Hypothyroidism 11/13/2010   UNSPEC COMBINED SYSTOLIC&DIASTOLIC HEART FAILURE 35/59/7416   HYPERTHYROIDISM 11/10/2010   DM 11/10/2010   Obstructive sleep apnea 11/10/2010   Essential hypertension 11/10/2010   GERD 11/10/2010   Gastro-esophageal reflux disease without esophagitis 11/10/2010   Essential (primary) hypertension 11/10/2010   Obstructive sleep apnea (adult) (pediatric) 11/10/2010   Type 2 diabetes mellitus, without long-term current use of insulin ([place]) 11/10/2010   PCP:  Rudene Anda, MD Pharmacy:   [facility], [place] [address] [place] [address] Phone: [phone] Fax: [phone]     Social Determinants of Health (SDOH) Interventions    Readmission Risk Interventions    07/23/2022    2:19 PM 04/09/2022     4:17 PM  Readmission Risk Prevention Plan  Transportation Screening Complete Complete  PCP or Specialist Appt within 3-5 Days  Complete  HRI or [place]  Complete  Social Work Consult for [place] Planning/Counseling  Complete  Palliative Care Screening  Not Applicable  Medication Review Press photographer) Complete Referral to Pharmacy  PCP or Specialist appointment within 3-5 days of discharge Complete   HRI or Home Care Consult Complete   SW Recovery Care/Counseling Consult Complete   Palliative Care Screening Not Applicable   Skilled Nursing Facility Complete

## 2022-08-01 DIAGNOSIS — I4892 Unspecified atrial flutter: Secondary | ICD-10-CM

## 2022-08-01 DIAGNOSIS — E039 Hypothyroidism, unspecified: Secondary | ICD-10-CM

## 2022-08-01 DIAGNOSIS — I5033 Acute on chronic diastolic (congestive) heart failure: Secondary | ICD-10-CM

## 2022-08-01 DIAGNOSIS — I890 Lymphedema, not elsewhere classified: Secondary | ICD-10-CM | POA: Diagnosis not present

## 2022-08-01 DIAGNOSIS — F32A Depression, unspecified: Secondary | ICD-10-CM

## 2022-08-01 DIAGNOSIS — K219 Gastro-esophageal reflux disease without esophagitis: Secondary | ICD-10-CM | POA: Diagnosis not present

## 2022-08-01 DIAGNOSIS — D638 Anemia in other chronic diseases classified elsewhere: Secondary | ICD-10-CM

## 2022-08-01 LAB — BASIC METABOLIC PANEL
Anion gap: 9 (ref 5–15)
BUN: 15 mg/dL (ref 6–20)
CO2: 33 mmol/L — ABNORMAL HIGH (ref 22–32)
Calcium: 9 mg/dL (ref 8.9–10.3)
Chloride: 98 mmol/L (ref 98–111)
Creatinine, Ser: 0.64 mg/dL (ref 0.44–1.00)
GFR, Estimated: >60 mL/min (ref >60)
Glucose, Bld: 151 mg/dL — ABNORMAL HIGH (ref 70–99)
Potassium: 3.8 mmol/L (ref 3.5–5.1)
Sodium: 140 mmol/L (ref 135–145)

## 2022-08-01 LAB — CBC
HCT: 34.1 % — ABNORMAL LOW (ref 36.0–46.0)
Hemoglobin: 10.1 g/dL — ABNORMAL LOW (ref 12.0–15.0)
MCH: 26.8 pg (ref 26.0–34.0)
MCHC: 29.6 g/dL — ABNORMAL LOW (ref 30.0–36.0)
MCV: 90.5 fL (ref 80.0–100.0)
Platelets: 204 10*3/uL (ref 150–400)
RBC: 3.77 MIL/uL — ABNORMAL LOW (ref 3.87–5.11)
RDW: 17.4 % — ABNORMAL HIGH (ref 11.5–15.5)
WBC: 4.5 10*3/uL (ref 4.0–10.5)
nRBC: 0 % (ref 0.0–0.2)

## 2022-08-01 LAB — GLUCOSE, CAPILLARY: Glucose-Capillary: 149 mg/dL — ABNORMAL HIGH (ref 70–99)

## 2022-08-01 MED ORDER — PANTOPRAZOLE SODIUM 40 MG PO TBEC
40.0000 mg | DELAYED_RELEASE_TABLET | Freq: Every day | ORAL | Status: DC
  Administered 2022-08-01 – 2022-08-03 (×3): 40 mg via ORAL
  Filled 2022-08-01 (×3): qty 1

## 2022-08-01 NOTE — TOC Progression Note (Signed)
Transition of Care [facility]) - Progression Note    Patient Details  Name: Chelsea [name] MRN: [id number] Date of Birth: 24-Mar-1966  Transition of Care [facility]) CM/SW [place], [place] Phone Number: 08/01/2022, 2:24 PM  Clinical Narrative:     CSW met with pt and confirmed plan to return to [facility] when medically ready. She confirms she already has a BIPAP there. TOC will continue to follow to assist with DC to SNF once medically ready.    Expected Discharge Plan: [place] Barriers to Discharge: Continued Medical Work up  Expected Discharge Plan and Services Expected Discharge Plan: [place] Choice: [place] arrangements for the past 2 months: [place]                                       Social Determinants of Health (SDOH) Interventions    Readmission Risk Interventions    07/23/2022    2:19 PM 04/09/2022    4:17 PM  Readmission Risk Prevention Plan  Transportation Screening Complete Complete  PCP or Specialist Appt within 3-5 Days  Complete  HRI or [place]  Complete  Social Work Consult for [place] Planning/Counseling  Complete  Palliative Care Screening  Not Applicable  Medication Review Press photographer) Complete Referral to Pharmacy  PCP or Specialist appointment within 3-5 days of discharge Complete   HRI or [place] Complete   SW Recovery Care/Counseling Consult Complete   Palliative Care Screening Not [place] Complete

## 2022-08-01 NOTE — Assessment & Plan Note (Addendum)
Echocardiogram with preserved LV systolic function with EF 60 to 65%, mild LVH, RV systolic with preserved systolic function, with mild enlarged cavity, LA with severe dilatation, moderate aortic valve stenosis.   Patient was placed on IV furosemide for diuresis, negative fluid balance was achieved, - 5,373 ml, with significant improvement in her symptoms.   Plan to continue diuresis with torsemide and spironolactone.  Considering her poor mobility and high risk for skin infections, will hold on SGLT 2 inh for now.  Acute on chronic hypoxemic respiratory failure, her oxygenation is 95% on 4 L per HFNC.  Transition to regular nasal cannula before her discharge.

## 2022-08-01 NOTE — Plan of Care (Signed)

## 2022-08-01 NOTE — Assessment & Plan Note (Signed)
Rate has been controlled.  Patient not on AV blockade. Continue anticoagulation with apixaban.

## 2022-08-01 NOTE — Progress Notes (Signed)
Progress Note   Patient: Chelsea [name] YQI:[id number] DOB: 1966-07-10 DOA: 07/29/2022     2 DOS: the patient was seen and examined on 08/01/2022   Brief hospital course: Mrs. Seltzer was admitted to the hospital with the working diagnosis of decompensated heart failure.   56 yo female with the past medical history of asthma, COPD, obesity hypoventilation sp trach (reverted), heart failure, T2DM and hypertension who presented with worsening oxygen requirements. Recent hospitalization 07/18/22 to 07/23/22 for upper GI bleed requiring PRBC transfusion x3 and EGD. She was transferred back to rehab. She was noted to have increase 02 requirements from 3 to 6 L/min, follow up chest radiograph showed pulmonary edema and she was referred to the ED for further evaluation. On her initial physical examination with blood pressure 90/50, HR 76, RR 20 and 02 saturation 93%, lungs with no wheezing or rales, heart with S1 and S2 present and rhythmic, abdomen not distended, no lower extremity edema.   VBG 7,53/ 44/ 77/ 37.3/ 97% Na 142, K 3,8 CL 96 bicarbonate 38 glucose 163 BUN 11 cr 0,68  Mg 1.5  Wbc 5,4 hgb 8.8 plt 212  Chest radiograph with cardiomegaly, bilateral interstitial infiltrates with bilateral hilar vascular congestin CT chest with bilateral ground glass opacities, small effusion on the left side.   EKG 78 bpm, normal axis, normal intervals, sinus rhythm with no significant ST segment changes, negative T wave lead V1 and V2.   Patient was placed on furosemide for diuresis and non invasive mechanical ventilation with Bipap.   Assessment and Plan: * Acute on chronic diastolic CHF (congestive heart failure) (HCC) Echocardiogram with preserved LV systolic function with EF 60 to 65%, mild LVH, RV systolic with preserved systolic function, with mild enlarged cavity, LA with severe dilatation, moderate aortic valve stenosis.   Urine output is 3,050 ml Serum cr is 0,64 with K at 3,8   Systolic blood pressure 99 to 117  Plan to continue diuresis with furosemide and spironolactone Limited pharmacologic therapy due to low blood pressure.  Patient will need to continue physical therapy at SNF, she has been very deconditioned.   Acute on chronic hypoxemic respiratory failure, her oxygenation is 91% on 6 L per HFNC.   Atrial flutter ([place]) Rate has been controlled.  Patient not on AV blockade. Continue anticoagulation with apixaban.   Essential hypertension Blood pressure has been low with systolic in the 90 to 387. Plan to keep MAP 65 or greater. Continue diuresis with furosemide and spironolactone At home patient on torsemide.   Chronic obstructive pulmonary disease, unspecified (HCC) No clinical signs of exacerbation, continue oxymetry monitoring.   GERD Continue with antiacid therapy.   Lymphedema of both lower extremities Chronic lymphedema with no clinical signs of cellulitis. Continue diuresis for heart failure, and continue to encourage mobility with Pt and Ot.  Out of bed to chair tid with meals.   Class 3 severe obesity due to excess calories with serious comorbidity and body mass index (BMI) of 60.0 to 69.9 in adult (HCC) Calculated BMI is 145   Depression Continue alprazolam.   Obesity hypoventilation syndrome ([place]) Patient had a trach in the past that now has been removed.  Continue close monitoring.  Bipap per home settings.   Hypothyroidism Continue with levothyroxine.   Chronic disease anemia hgb has been stable. Will need close follow up as outpatient.     Subjective: Patient with improvement in her symptoms but not back to baseline, continue to have dyspnea and  decreased mobility   Physical Exam: Vitals:   08/01/22 0100 08/01/22 0319 08/01/22 0755 08/01/22 0800  BP:  117/62 (!) 88/55 90/63  Pulse:  72  74  Resp: '14 18 13 13  '$ Temp:  98.6 F (37 C) 97.9 F (36.6 C)   TempSrc:  Axillary Oral   SpO2:  (!) 84%  91%  Weight:   (!) 145.8 kg    Height:       Neurology awake and alert ENT with mild pallor Cardiovascular with S1 and S2 present and regular, positive systolic murmur at the base  No JVD Respiratory with no rales or wheezing on anterior auscultation  Abdomen with no distention  Positive lymphedema with trace ankle edema  Data Reviewed:    Family Communication: no family at the bedside   Disposition: Status is: Inpatient Remains inpatient appropriate because: heart failure   Planned Discharge Destination: Skilled nursing facility     Author: Tawni Millers, MD 08/01/2022 10:24 AM  For on call review [email or web address].

## 2022-08-01 NOTE — Assessment & Plan Note (Signed)
Continue with antiacid therapy.  ?

## 2022-08-01 NOTE — Assessment & Plan Note (Signed)
Continue with levothyroxine  

## 2022-08-01 NOTE — Progress Notes (Signed)
Pt wants to wait until 0100 to put  CPAP on.

## 2022-08-01 NOTE — Assessment & Plan Note (Signed)
Chronic lymphedema with no clinical signs of cellulitis. Continue diuresis for heart failure, and continue to encourage mobility with Pt and Ot.  Out of bed to chair tid with meals.

## 2022-08-01 NOTE — Assessment & Plan Note (Signed)
Patient had a trach in the past that now has been removed.  Continue close monitoring.  Bipap per home settings.

## 2022-08-01 NOTE — Assessment & Plan Note (Signed)
Continue alprazolam 

## 2022-08-01 NOTE — Assessment & Plan Note (Signed)
hgb has been stable. Will need close follow up as outpatient.

## 2022-08-01 NOTE — Progress Notes (Signed)
Heart Failure Navigator Progress Note  Assessed for Heart & Vascular TOC clinic readiness.  Patient does not meet criteria due to admission related to chronic respiratory failure with hypoxia per Dr. Broadus John. Earnestine Leys, BSN, RN Heart Failure Transport planner Only

## 2022-08-01 NOTE — Assessment & Plan Note (Signed)
Calculated BMI is 145

## 2022-08-01 NOTE — Assessment & Plan Note (Addendum)
Systolic blood pressure 037 mmHg Plan to continue with spironolactone and torsemide for diuresis.  If blood pressure tolerated possible addition of ARB as outpatient.

## 2022-08-01 NOTE — Hospital Course (Addendum)
Chelsea [name] was admitted to the hospital with the working diagnosis of decompensated heart failure.   56 yo female with the past medical history of asthma, COPD, obesity hypoventilation sp trach (reverted), heart failure, T2DM and hypertension who presented with worsening oxygen requirements. Recent hospitalization 07/18/22 to 07/23/22 for upper GI bleed requiring PRBC transfusion x3 and EGD. She was transferred back to rehab. She was noted to have increase 02 requirements from 3 to 6 L/min, follow up chest radiograph showed pulmonary edema and she was referred to the ED for further evaluation. On her initial physical examination with blood pressure 90/50, HR 76, RR 20 and 02 saturation 93%, lungs with no wheezing or rales, heart with S1 and S2 present and rhythmic, abdomen not distended, no lower extremity edema.   VBG 7,53/ 44/ 77/ 37.3/ 97% Na 142, K 3,8 CL 96 bicarbonate 38 glucose 163 BUN 11 cr 0,68  Mg 1.5  Wbc 5,4 hgb 8.8 plt 212  Chest radiograph with cardiomegaly, bilateral interstitial infiltrates with bilateral hilar vascular congestin CT chest with bilateral ground glass opacities, small effusion on the left side.   EKG 78 bpm, normal axis, normal intervals, sinus rhythm with no significant ST segment changes, negative T wave lead V1 and V2.   Patient was placed on furosemide for diuresis and non invasive mechanical ventilation with Bipap.  11/02 Patient with improvement in volume status.  11/03 plan to return to SNF to continue physical therapy.

## 2022-08-01 NOTE — Progress Notes (Signed)
   08/01/22 0755  Assess: MEWS Score  Temp 97.9 F (36.6 C)  BP (!) 88/55  MAP (mmHg) 65  Resp 13  O2 Device HFNC  O2 Flow Rate (L/min) 6 L/min  Assess: MEWS Score  MEWS Temp 0  MEWS Systolic 1  MEWS Pulse 0  MEWS RR 1  MEWS LOC 0  MEWS Score 2  MEWS Score Color Yellow  Take Vital Signs  Increase Vital Sign Frequency  Yellow: Q 2hr X 2 then Q 4hr X 2, if remains yellow, continue Q 4hrs  Escalate  MEWS: Escalate Yellow: discuss with charge nurse/RN and consider discussing with provider and RRT  Notify: Charge Nurse/RN  Name of Charge Nurse/RN Notified Beverlee Nims  Date Charge Nurse/RN Notified 08/01/22  Time Charge Nurse/RN Notified 5038  Notify: Provider  Provider Name/Title Dr. Cathlean Sauer  Date Provider Notified 08/01/22  Time Provider Notified [phone]  Method of Notification Page  Notification Reason Other (Comment) (low BP)  Provider response Other (Comment) (" ok to give lasix and spironalactone. Keep MAP 65 or greater")  Date of Provider Response 08/01/22  Time of Provider Response 1003  Assess: SIRS CRITERIA  SIRS Temperature  0  SIRS Pulse 0  SIRS Respirations  0  SIRS WBC 1  SIRS Score Sum  1

## 2022-08-01 NOTE — Assessment & Plan Note (Signed)
No clinical signs of exacerbation, continue oxymetry monitoring.  ?

## 2022-08-02 DIAGNOSIS — Z6841 Body Mass Index (BMI) 40.0 and over, adult: Secondary | ICD-10-CM

## 2022-08-02 DIAGNOSIS — J449 Chronic obstructive pulmonary disease, unspecified: Secondary | ICD-10-CM | POA: Diagnosis not present

## 2022-08-02 DIAGNOSIS — I5033 Acute on chronic diastolic (congestive) heart failure: Secondary | ICD-10-CM | POA: Diagnosis not present

## 2022-08-02 DIAGNOSIS — I4892 Unspecified atrial flutter: Secondary | ICD-10-CM | POA: Diagnosis not present

## 2022-08-02 LAB — BASIC METABOLIC PANEL
Anion gap: 10 (ref 5–15)
BUN: 19 mg/dL (ref 6–20)
CO2: 34 mmol/L — ABNORMAL HIGH (ref 22–32)
Calcium: 8.6 mg/dL — ABNORMAL LOW (ref 8.9–10.3)
Chloride: 94 mmol/L — ABNORMAL LOW (ref 98–111)
Creatinine, Ser: 0.76 mg/dL (ref 0.44–1.00)
GFR, Estimated: >60 mL/min (ref >60)
Glucose, Bld: 124 mg/dL — ABNORMAL HIGH (ref 70–99)
Potassium: 4.1 mmol/L (ref 3.5–5.1)
Sodium: 138 mmol/L (ref 135–145)

## 2022-08-02 LAB — MAGNESIUM: Magnesium: 1.9 mg/dL (ref 1.7–2.4)

## 2022-08-02 MED ORDER — MAGNESIUM SULFATE 2 GM/50ML IV SOLN
2.0000 g | Freq: Once | INTRAVENOUS | Status: AC
  Administered 2022-08-02: 2 g via INTRAVENOUS
  Filled 2022-08-02: qty 50

## 2022-08-02 NOTE — NC FL2 (Signed)
[place] LEVEL OF CARE SCREENING TOOL     IDENTIFICATION  Patient Name: Chelsea [name] Birthdate: Mar 15, 1966 Sex: female Admission Date (Current Location): 07/29/2022  [facility] and [place] Number:  Herbalist and Address:  [facility], [place] [address] [place], [place] [address]      Provider Number: [id number]  Attending Physician Name and Address:  Tawni Millers,*  Relative Name and Phone Number:  Pricilla Riffle (Significant other)  [phone] (Mobile)    Current Level of Care: Hospital Recommended Level of Care: [place] Prior Approval Number:    Date Approved/Denied:   PASRR Number: [id number]  Discharge Plan: SNF    Current Diagnoses: Patient Active Problem List   Diagnosis Date Noted   Chronic disease anemia 08/01/2022   CHF exacerbation ([place]) 07/30/2022   CHF (congestive heart failure) ([place]) 07/30/2022   Upper GI bleed 07/18/2022   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   Acute blood loss anemia 07/18/2022   Acute on chronic hypoxic respiratory failure ([place]) 07/04/2022   Bleeding of the respiratory tract 04/07/2022   Acute on chronic respiratory failure with hypoxia and hypercapnia ([place]) 04/07/2022   Pulmonary edema, acute ([place]) 02/21/2022   Restrictive lung disease secondary to obesity 01/28/2022   Symptomatic anemia 01/04/2022   Limitation of activity due to disability 09/18/2021   Paroxysmal atrial fibrillation ([place]) 09/04/2021   DJD (degenerative joint disease) 07/03/2021   Venous insufficiency of both lower extremities 07/02/2021   Physical deconditioning 04/24/2021   Depression 04/20/2021   History of tobacco use 04/20/2021   Hyperglycemia due to type 2 diabetes mellitus ([place]) 04/20/2021   Irritable bowel syndrome with constipation 04/20/2021   Microalbuminuria 04/20/2021   On supplemental oxygen by nasal cannula 04/20/2021    Renal insufficiency 04/20/2021   Heart failure ([place]) 04/20/2021   Moderate aortic stenosis 04/20/2021   Mild tricuspid regurgitation 04/02/2021   Mitral valve sclerosis 04/02/2021   Morbid obesity with BMI of 50.0-59.9, adult ([place]) 04/02/2021   Non-smoker 04/02/2021   Awaiting admission to adequate facility elsewhere 97/98/9211   Metabolic alkalosis 94/17/4081   Restless leg syndrome 03/22/2021   Weakness 03/20/2021   Acute congestive heart failure ([place]) 03/18/2021   Hx of atrial flutter 03/15/2021   Weight loss 03/15/2021   Atrial flutter ([place]) 03/09/2021   S/P hysterectomy 02/19/2021   Pulmonary nodule less than 6 cm determined by computed tomography of lung 02/09/2021   Dyspnea on exertion 02/09/2021   Nocturnal hypoxemia 06/03/2020   Mixed hyperlipidemia 04/18/2020   Hair loss 09/17/2019   Chronic pain of multiple joints 06/18/2019   Irritable bowel syndrome with diarrhea 06/18/2019   Onychomycosis 06/18/2019   Acute idiopathic gout of left hand 02/16/2019   Tinea pedis of both feet 11/12/2018   Hypertension associated with diabetes ([place]) 08/25/2018   Class 3 severe obesity with serious comorbidity and body mass index (BMI) of 50.0 to 59.9 in adult ([place]) 08/25/2018   Class 3 severe obesity due to excess calories with serious comorbidity and body mass index (BMI) of 60.0 to 69.9 in adult ([place]) 08/25/2018   Chronic respiratory failure with hypoxia ([place]) 07/02/2018   Physical debility 05/10/2018   Hepatitis C    Neuropathy due to type 2 diabetes mellitus ([place]) 02/24/2018   Chronic heart failure with preserved ejection fraction ([place]) 02/23/2018   Dyspnea 12/05/2017   Acute on chronic diastolic CHF (congestive heart failure) ([place]) 09/25/2017   Pneumonia due to gram-positive  bacteria 08/24/2017   Acute respiratory failure with hypoxia and hypercarbia (HCC) 05/27/2016   Hyperlipidemia 09/28/2015   Chronic obstructive pulmonary disease, unspecified ([place]) 03/04/2015   Bronchitis  05/30/2011   Cellulitis, leg 04/29/2011   Neuropathic pain, leg 04/09/2011   Mononeuropathy of lower extremity 04/09/2011   Edema 03/22/2011   Morbid obesity ([place]) 03/22/2011   History of cervical cancer 03/22/2011   Lymphedema of both lower extremities 03/22/2011   Morbid (severe) obesity due to excess calories ([place]) 03/22/2011   Ankle pain, left 03/14/2011   Obesity hypoventilation syndrome ([place]) 01/01/2011   Osteoarthritis 11/28/2010   Chronic diastolic heart failure ([place]) 11/14/2010   Hypothyroidism 11/13/2010   UNSPEC COMBINED SYSTOLIC&DIASTOLIC HEART FAILURE 83/15/1761   HYPERTHYROIDISM 11/10/2010   DM 11/10/2010   Obstructive sleep apnea 11/10/2010   Essential hypertension 11/10/2010   GERD 11/10/2010   Gastro-esophageal reflux disease without esophagitis 11/10/2010   Essential (primary) hypertension 11/10/2010   Obstructive sleep apnea (adult) (pediatric) 11/10/2010   Type 2 diabetes mellitus, without long-term current use of insulin ([place]) 11/10/2010    Orientation RESPIRATION BLADDER Height & Weight     Self, Time, Situation, Place  O2 (6LNC) External catheter Weight: (!) 319 lb 3.6 oz (144.8 kg) Height:  '5\' 5"'$  (165.1 cm)  BEHAVIORAL SYMPTOMS/MOOD NEUROLOGICAL BOWEL NUTRITION STATUS      Continent Diet (see d/c summary)  AMBULATORY STATUS COMMUNICATION OF NEEDS Skin   Limited Assist Verbally Normal                       Personal Care Assistance Level of Assistance  Bathing, Feeding, Dressing Bathing Assistance: Limited assistance Feeding assistance: Independent Dressing Assistance: Limited assistance     Functional Limitations Info  Sight, Hearing, Speech Sight Info: Impaired Hearing Info: Adequate Speech Info: Adequate    SPECIAL CARE FACTORS FREQUENCY  PT (By licensed PT), OT (By licensed OT)     PT Frequency: 5x/week OT Frequency: 5x/week            Contractures Contractures Info: Not present    Additional Factors Info  Code Status,  Allergies Code Status Info: Full code Allergies Info: Iodinated Contrast Media Penicillins Insulin Lispro Minoxidil           Current Medications (08/02/2022):  This is the current hospital active medication list Current Facility-Administered Medications  Medication Dose Route Frequency Provider Last Rate Last Admin   acetaminophen (TYLENOL) tablet 650 mg  650 mg Oral Q6H PRN Clance Boll, MD   650 mg at 08/01/22 2024   Or   acetaminophen (TYLENOL) suppository 650 mg  650 mg Rectal Q6H PRN Clance Boll, MD       albuterol (PROVENTIL) (2.5 MG/3ML) 0.083% nebulizer solution 2.5 mg  2.5 mg Nebulization Q2H PRN Clance Boll, MD       ALPRAZolam Duanne Moron) tablet 0.5 mg  0.5 mg Oral Q8H PRN Shela Leff, MD   0.5 mg at 08/01/22 2024   apixaban (ELIQUIS) tablet 5 mg  5 mg Oral BID Pham, Minh Q, RPH-CPP   5 mg at 08/02/22 0807   gabapentin (NEURONTIN) capsule 400 mg  400 mg Oral TID Domenic Polite, MD   400 mg at 08/02/22 0807   levothyroxine (SYNTHROID) tablet 150 mcg  150 mcg Oral Q0600 Domenic Polite, MD   150 mcg at 08/02/22 0531   Muscle Rub CREA   Topical PRN Clance Boll, MD   Given at 07/30/22 2008   ondansetron [facility]) tablet 4  mg  4 mg Oral Q6H PRN Clance Boll, MD       Or   ondansetron [facility]) injection 4 mg  4 mg Intravenous Q6H PRN Clance Boll, MD       oxyCODONE-acetaminophen (PERCOCET/ROXICET) 5-325 MG per tablet 1 tablet  1 tablet Oral Q6H PRN Alma Friendly, MD   1 tablet at 08/02/22 0815   pantoprazole (PROTONIX) EC tablet 40 mg  40 mg Oral Daily Arrien, Jimmy Picket, MD   40 mg at 08/02/22 1021   spironolactone (ALDACTONE) tablet 12.5 mg  12.5 mg Oral Daily Domenic Polite, MD   12.5 mg at 08/02/22 1173     Discharge Medications: Please see discharge summary for a list of discharge medications.  Relevant Imaging Results:  Relevant Lab Results:   Additional Information SSN [id number]  Bethann Berkshire,  LCSW

## 2022-08-02 NOTE — Progress Notes (Signed)
Progress Note   Patient: Chelsea [name] LEX:[id number] DOB: 1965-12-16 DOA: 07/29/2022     3 DOS: the patient was seen and examined on 08/02/2022   Brief hospital course: Chelsea [name] was admitted to the hospital with the working diagnosis of decompensated heart failure.   56 yo female with the past medical history of asthma, COPD, obesity hypoventilation sp trach (reverted), heart failure, T2DM and hypertension who presented with worsening oxygen requirements. Recent hospitalization 07/18/22 to 07/23/22 for upper GI bleed requiring PRBC transfusion x3 and EGD. She was transferred back to rehab. She was noted to have increase 02 requirements from 3 to 6 L/min, follow up chest radiograph showed pulmonary edema and she was referred to the ED for further evaluation. On her initial physical examination with blood pressure 90/50, HR 76, RR 20 and 02 saturation 93%, lungs with no wheezing or rales, heart with S1 and S2 present and rhythmic, abdomen not distended, no lower extremity edema.   VBG 7,53/ 44/ 77/ 37.3/ 97% Na 142, K 3,8 CL 96 bicarbonate 38 glucose 163 BUN 11 cr 0,68  Mg 1.5  Wbc 5,4 hgb 8.8 plt 212  Chest radiograph with cardiomegaly, bilateral interstitial infiltrates with bilateral hilar vascular congestin CT chest with bilateral ground glass opacities, small effusion on the left side.   EKG 78 bpm, normal axis, normal intervals, sinus rhythm with no significant ST segment changes, negative T wave lead V1 and V2.   Patient was placed on furosemide for diuresis and non invasive mechanical ventilation with Bipap.  11/02 Patient with improvement in volume status.   Assessment and Plan: * Acute on chronic diastolic CHF (congestive heart failure) (HCC) Echocardiogram with preserved LV systolic function with EF 60 to 65%, mild LVH, RV systolic with preserved systolic function, with mild enlarged cavity, LA with severe dilatation, moderate aortic valve stenosis.   Urine output  is 4,496 ml  Systolic blood pressure is 82 to 109 mmHg,  Renal function stable with serum cr at 0,76, K is 4,1 and Mg is 1,9 Add 2 g mag sulfate to prevent hypomagnesemia.   Patient has diuresed well, will transition to oral furosemide in am Continue with spironolactone.   Acute on chronic hypoxemic respiratory failure, her oxygenation is 95% on 6 L per HFNC.   Atrial flutter ([place]) Rate has been controlled.  Patient not on AV blockade. Continue anticoagulation with apixaban.   Essential hypertension Plan to keep MAP 65 or greater. Transition to oral loop diuretic in am.   Chronic obstructive pulmonary disease, unspecified ([place]) No clinical signs of exacerbation, continue oxymetry monitoring.   GERD Continue with antiacid therapy.   Lymphedema of both lower extremities Chronic lymphedema with no clinical signs of cellulitis. Continue diuresis for heart failure, and continue to encourage mobility with Pt and Ot.  Out of bed to chair tid with meals.   Class 3 severe obesity due to excess calories with serious comorbidity and body mass index (BMI) of 60.0 to 69.9 in adult (HCC) Calculated BMI is 145   Depression Continue alprazolam.   Obesity hypoventilation syndrome ([place]) Patient had a trach in the past that now has been removed.  Continue close monitoring.  Bipap per home settings.   Hypothyroidism Continue with levothyroxine.   Chronic disease anemia hgb has been stable. Will need close follow up as outpatient.        Subjective: Patient with no chest pain, edema and dyspnea have improved,   Physical Exam: Vitals:   08/01/22 1920 08/02/22  0110 08/02/22 0310 08/02/22 0420  BP: (!) 82/56 (!) 82/56 (!) 110/52 (!) 109/59  Pulse: 72 78 65 (!) 56  Resp: 20 (!) '22 20 20  '$ Temp: 98.5 F (36.9 C)  98.6 F (37 C) 98.5 F (36.9 C)  TempSrc: Oral   Oral  SpO2: 95% 96%  95%  Weight:    (!) 144.8 kg  Height:       Neurology awake and alert ENT with no  pallor Cardiovascular with S1 and S2 present and rhythmic Respiratory with no rales or wheezing Abdomen with no distention  No ankle edema, positive lymphedema  Data Reviewed:    Family Communication: no family at the bedside   Disposition: Status is: Inpatient Remains inpatient appropriate because: heart failure   Planned Discharge Destination: Skilled nursing facility   Author: Tawni Millers, MD 08/02/2022 2:06 PM  For on call review [email or web address].

## 2022-08-03 DIAGNOSIS — I4892 Unspecified atrial flutter: Secondary | ICD-10-CM | POA: Diagnosis not present

## 2022-08-03 DIAGNOSIS — I5033 Acute on chronic diastolic (congestive) heart failure: Secondary | ICD-10-CM | POA: Diagnosis not present

## 2022-08-03 DIAGNOSIS — K219 Gastro-esophageal reflux disease without esophagitis: Secondary | ICD-10-CM | POA: Diagnosis not present

## 2022-08-03 LAB — BASIC METABOLIC PANEL
Anion gap: 9 (ref 5–15)
BUN: 19 mg/dL (ref 6–20)
CO2: 35 mmol/L — ABNORMAL HIGH (ref 22–32)
Calcium: 8.8 mg/dL — ABNORMAL LOW (ref 8.9–10.3)
Chloride: 94 mmol/L — ABNORMAL LOW (ref 98–111)
Creatinine, Ser: 0.73 mg/dL (ref 0.44–1.00)
GFR, Estimated: >60 mL/min (ref >60)
Glucose, Bld: 141 mg/dL — ABNORMAL HIGH (ref 70–99)
Potassium: 4 mmol/L (ref 3.5–5.1)
Sodium: 138 mmol/L (ref 135–145)

## 2022-08-03 MED ORDER — SPIRONOLACTONE 25 MG PO TABS: 12.5000 mg | ORAL_TABLET | Freq: Every day | ORAL | 0 refills | Status: DC

## 2022-08-03 MED ORDER — ALPRAZOLAM 0.5 MG PO TABS: 0.5000 mg | ORAL_TABLET | Freq: Three times a day (TID) | ORAL | 0 refills | Status: DC | PRN

## 2022-08-03 MED ORDER — HYDROCODONE-ACETAMINOPHEN 5-325 MG PO TABS: 1.0000 | ORAL_TABLET | Freq: Four times a day (QID) | ORAL | 0 refills | Status: DC | PRN

## 2022-08-03 NOTE — Progress Notes (Signed)
Pt states she has not been able to sleep all night and still don't wish to try CPAP at this time.

## 2022-08-03 NOTE — Discharge Summary (Signed)
Physician Discharge Summary   Patient: Chelsea [name] MRN: [id number] DOB: 02-Jun-1966  Admit date:     07/29/2022  Discharge date: 08/03/22  Discharge Physician: Jimmy Picket Wilhelmina Hark   PCP: Rudene Anda, MD   Recommendations at discharge:    Added spironolactone to augment diuresis Continue with torsemide 40 mg daily Follow up renal function and electrolytes as outpatient in 7 days. If blood pressure tolerates patient will benefit from aldosterone receptor blocker.   Discharge Diagnoses: Principal Problem:   Acute on chronic diastolic CHF (congestive heart failure) (HCC) Active Problems:   Atrial flutter (HCC)   Essential hypertension   Chronic obstructive pulmonary disease, unspecified (HCC)   GERD   Lymphedema of both lower extremities   Class 3 severe obesity due to excess calories with serious comorbidity and body mass index (BMI) of 60.0 to 69.9 in adult [facility])   Depression   Obesity hypoventilation syndrome (HCC)   Hypothyroidism   Chronic disease anemia  Resolved Problems:   * No resolved hospital problems. [facility] Course: Chelsea [name] was admitted to the hospital with the working diagnosis of decompensated heart failure.   Chelsea [name] with the past medical history of asthma, COPD, obesity hypoventilation sp trach (reverted), heart failure, T2DM and hypertension who presented with worsening oxygen requirements. Recent hospitalization 07/18/22 to 07/23/22 for upper GI bleed requiring PRBC transfusion x3 and EGD. She was transferred back to rehab. She was noted to have increase 02 requirements from 3 to 6 L/min, follow up chest radiograph showed pulmonary edema and she was referred to the ED for further evaluation. On her initial physical examination with blood pressure 90/50, HR 76, RR 20 and 02 saturation 93%, lungs with no wheezing or rales, heart with S1 and S2 present and rhythmic, abdomen not distended, no lower extremity edema.   VBG 7,53/ 44/ 77/ 37.3/  97% Na 142, K 3,8 CL 96 bicarbonate 38 glucose 163 BUN 11 cr 0,68  Mg 1.5  Wbc 5,4 hgb 8.8 plt 212  Chest radiograph with cardiomegaly, bilateral interstitial infiltrates with bilateral hilar vascular congestin CT chest with bilateral ground glass opacities, small effusion on the left side.   EKG 78 bpm, normal axis, normal intervals, sinus rhythm with no significant ST segment changes, negative T wave lead V1 and V2.   Patient was placed on furosemide for diuresis and non invasive mechanical ventilation with Bipap.  11/02 Patient with improvement in volume status.  11/03 plan to return to SNF to continue physical therapy.   Assessment and Plan: * Acute on chronic diastolic CHF (congestive heart failure) (HCC) Echocardiogram with preserved LV systolic function with EF 60 to 65%, mild LVH, RV systolic with preserved systolic function, with mild enlarged cavity, LA with severe dilatation, moderate aortic valve stenosis.   Patient was placed on IV furosemide for diuresis, negative fluid balance was achieved, - 5,373 ml, with significant improvement in her symptoms.   Plan to continue diuresis with torsemide and spironolactone.  Considering her poor mobility and high risk for skin infections, will hold on SGLT 2 inh for now.  Acute on chronic hypoxemic respiratory failure, her oxygenation is 95% on 4 L per HFNC.  Transition to regular nasal cannula before her discharge.   Atrial flutter ([place]) Rate has been controlled.  Patient not on AV blockade. Continue anticoagulation with apixaban.   Essential hypertension Systolic blood pressure 073 mmHg Plan to continue with spironolactone and torsemide for diuresis.  If blood pressure tolerated possible addition of  ARB as outpatient.   Chronic obstructive pulmonary disease, unspecified (HCC) No clinical signs of exacerbation, continue oxymetry monitoring.   GERD Continue with antiacid therapy.   Lymphedema of both lower  extremities Chronic lymphedema with no clinical signs of cellulitis. Continue diuresis for heart failure, and continue to encourage mobility with Pt and Ot.  Out of bed to chair tid with meals.   Class 3 severe obesity due to excess calories with serious comorbidity and body mass index (BMI) of 60.0 to 69.9 in adult (HCC) Calculated BMI is 145   Depression Continue alprazolam.   Obesity hypoventilation syndrome ([place]) Patient had a trach in the past that now has been removed.  Continue close monitoring.  Bipap per home settings.   Hypothyroidism Continue with levothyroxine.   Chronic disease anemia hgb has been stable. Will need close follow up as outpatient.         Consultants: none Procedures performed: none  Disposition: Skilled nursing facility Diet recommendation:  Cardiac diet and diabetic  DISCHARGE MEDICATION: Allergies as of 08/03/2022       Reactions   Iodinated Contrast Media Anaphylaxis   Penicillins Anaphylaxis   Patient tolerates cefepime (08/2017)   Insulin Lispro Other (See Comments)   Adder per MAR from SNF   Minoxidil    Other reaction(s): hives        Medication List     STOP taking these medications    GLUCAGON HCL IJ       TAKE these medications    acetaminophen 325 MG tablet Commonly known as: TYLENOL Take 650 mg by mouth every 6 (six) hours as needed for mild pain.   albuterol (2.5 MG/3ML) 0.083% nebulizer solution Commonly known as: PROVENTIL Take 3 mLs (2.5 mg total) by nebulization every 6 (six) hours as needed for wheezing or shortness of breath (I50.32).   ALPRAZolam 0.5 MG tablet Commonly known as: XANAX Take 1 tablet (0.5 mg total) by mouth every 8 (eight) hours as needed for anxiety.   ammonium lactate 12 % lotion Commonly known as: LAC-HYDRIN Apply 1 Application topically every evening. Apply to  legs and feet every evening shift   BIOFREEZE EX Apply 1 application  topically as needed (fo pain).   budesonide  0.5 MG/2ML nebulizer solution Commonly known as: PULMICORT Inhale 0.5 mg into the lungs every 12 (twelve) hours.   Cholecalciferol 125 MCG (5000 UT) Tabs Take 1 tablet (5,000 Units total) by mouth daily.   cyanocobalamin 1000 MCG tablet Commonly known as: VITAMIN B12 Take 1 tablet (1,000 mcg total) by mouth daily.   diclofenac Sodium 1 % Gel Commonly known as: VOLTAREN Apply 4 g topically every 6 (six) hours as needed (pain).   Eliquis 5 MG Tabs tablet Generic drug: apixaban Take 5 mg by mouth 2 (two) times daily.   ferrous sulfate 324 MG Tbec Take 324 mg by mouth daily.   folic acid 1 MG tablet Commonly known as: FOLVITE Take 1 tablet (1 mg total) by mouth daily.   gabapentin 400 MG capsule Commonly known as: NEURONTIN Take 400 mg by mouth 3 (three) times daily.   HYDROcodone-acetaminophen 5-325 MG tablet Commonly known as: NORCO/VICODIN Take 1 tablet by mouth every 6 (six) hours as needed for severe pain (for back and shoulder pain). What changed: reasons to take this   insulin aspart 100 UNIT/ML injection Commonly known as: novoLOG insulin aspart (novoLOG) injection 0-24 Units 0-24 Units, Subcutaneous, 3 times daily with meals, CBG < 70: Implement Hypoglycemia measures, call  MD CBG < 120 (dose in units): 0 CBG 120 - 160 (dose in units): 2 CBG 161 - 200 (dose in units): 4 CBG 201 - 250 (dose in units): 8 CBG 251 - 300 (dose in units): 12 CBG 301 - 350 (dose in units): 16 CBG 351 - 450 (dose in units): 20 CBG > 450: 24 units and obtain STAT glucose and notify MD   levothyroxine 150 MCG tablet Commonly known as: SYNTHROID Take 1 tablet (150 mcg total) by mouth daily at 6 (six) AM.   pantoprazole 40 MG tablet Commonly known as: PROTONIX Take 1 tablet (40 mg total) by mouth daily.   polyethylene glycol 17 g packet Commonly known as: MIRALAX / GLYCOLAX Take 17 g by mouth daily.   potassium chloride SA 20 MEQ tablet Commonly known as: KLOR-CON M Take 1  tablet (20 mEq total) by mouth daily.   senna-docusate 8.6-50 MG tablet Commonly known as: Senokot-S Take 2 tablets by mouth 2 (two) times daily. What changed: how much to take   spironolactone 25 MG tablet Commonly known as: ALDACTONE Take 0.5 tablets (12.5 mg total) by mouth daily. Start taking on: August 04, 2022   torsemide 20 MG tablet Commonly known as: DEMADEX Take 40 mg by mouth daily.        Discharge Exam: Filed Weights   08/01/22 0319 08/02/22 0420 08/03/22 0535  Weight: (!) 145.8 kg (!) 144.8 kg (!) 147.7 kg   BP (!) 101/53 (BP Location: Right Arm)   Pulse 64   Temp 97.7 F (36.5 C) (Oral)   Resp 20   Ht _0  (1.651 m)   Wt (!) 147.7 kg   SpO2 98%   BMI 54.19 kg/m   Patient with improvement in her symptoms, no dyspnea or edema, no orthopnea or PND.  Neurology awake and alert ENT with mild pallor Cardiovascular with S1 and S2 present and rhythmic, positive systolic murmur at the apex, no gallops or rubs Respiratory with no rales or wheezing, no rhonchi Abdomen with no distention  No edema at the ankles, no pitting edema   Condition at discharge: stable  The results of significant diagnostics from this hospitalization (including imaging, microbiology, ancillary and laboratory) are listed below for reference.   Imaging Studies: VAS [place] LOWER EXTREMITY VENOUS (DVT)  Result Date: 07/31/2022  Lower Venous DVT Study Patient Name:  MIYA LUVIANO  Date of Exam:   07/30/2022 Medical Rec #: [id number]             Accession #:    [id number] Date of Birth: 03-Dec-1965             Patient Gender: F Patient Age:   75 years Exam Location:  [facility] Procedure:      VAS [place] LOWER EXTREMITY VENOUS (DVT) Referring Phys: Myles Rosenthal --------------------------------------------------------------------------------  Indications: SOB, and Swelling.  Comparison Study: No prior study Performing Technologist: Sharion Dove RVS  Examination Guidelines: A  complete evaluation includes B-mode imaging, spectral Doppler, color Doppler, and power Doppler as needed of all accessible portions of each vessel. Bilateral testing is considered an integral part of a complete examination. Limited examinations for reoccurring indications may be performed as noted. The reflux portion of the exam is performed with the patient in reverse Trendelenburg.  +---------+---------------+---------+-----------+----------+-------------------+ RIGHT    CompressibilityPhasicitySpontaneityPropertiesThrombus Aging      +---------+---------------+---------+-----------+----------+-------------------+ CFV      Full           Yes  Yes                                      +---------+---------------+---------+-----------+----------+-------------------+ SFJ      Full                                                             +---------+---------------+---------+-----------+----------+-------------------+ FV Prox  Full           Yes      Yes                                      +---------+---------------+---------+-----------+----------+-------------------+ FV Mid                  Yes      Yes                                      +---------+---------------+---------+-----------+----------+-------------------+ FV DistalFull                                                             +---------+---------------+---------+-----------+----------+-------------------+ PFV      Full                                                             +---------+---------------+---------+-----------+----------+-------------------+ POP      Full           Yes      Yes                                      +---------+---------------+---------+-----------+----------+-------------------+ PTV                                                   Not well visualized +---------+---------------+---------+-----------+----------+-------------------+ PERO                                                   Not well visualized +---------+---------------+---------+-----------+----------+-------------------+   +---------+---------------+---------+-----------+----------+-------------------+ LEFT     CompressibilityPhasicitySpontaneityPropertiesThrombus Aging      +---------+---------------+---------+-----------+----------+-------------------+ CFV      Full           Yes      Yes                                      +---------+---------------+---------+-----------+----------+-------------------+  SFJ      Full                                                             +---------+---------------+---------+-----------+----------+-------------------+ FV Prox                 Yes      Yes                                      +---------+---------------+---------+-----------+----------+-------------------+ FV Mid   Full                                                             +---------+---------------+---------+-----------+----------+-------------------+ FV DistalFull                                                             +---------+---------------+---------+-----------+----------+-------------------+ PFV                                                   Not well visualized +---------+---------------+---------+-----------+----------+-------------------+ POP      Full                                                             +---------+---------------+---------+-----------+----------+-------------------+ PTV                                                   Not well visualized +---------+---------------+---------+-----------+----------+-------------------+ PERO                                                  Not well visualized +---------+---------------+---------+-----------+----------+-------------------+     Summary: RIGHT: - There is no obvious evidence of deep vein thrombosis in the visualized veins of the  lower extremity. However, portions of this examination were limited- see technologist comments above.  LEFT: - There is no obvious evidence of deep vein thrombosis in the visualized veins of the lower extremity. However, portions of this examination were limited- see technologist comments above.  *See table(s) above for measurements and observations. Electronically signed by Harold Barban MD on 07/31/2022 at 8:50:15 PM.    Final    NM Pulmonary Perfusion  Result Date: 07/30/2022 CLINICAL DATA:  Chronic respiratory failure with hypoxia,  increased oxygen requirement, chest pain, question pulmonary embolism. History COPD/asthma, tracheostomy, obesity hypoventilation syndrome, aortic stenosis, type II diabetes mellitus, hypertension, hepatitis C EXAM: NUCLEAR MEDICINE PERFUSION LUNG SCAN TECHNIQUE: Perfusion images were obtained in multiple projections after intravenous injection of radiopharmaceutical. Ventilation scans intentionally deferred if perfusion scan and chest x-ray adequate for interpretation during COVID 19 epidemic. RADIOPHARMACEUTICALS:  3.9 mCi Tc-74mMAA IV COMPARISON:  Chest CT and chest radiographs of 07/29/2022 FINDINGS: Enlargement of cardiac silhouette. Small LEFT pleural effusion. No met segmental or subsegmental perfusion defects to suggest pulmonary embolism. IMPRESSION: Pulmonary embolism absent Electronically Signed   By: MLavonia DanaM.D.   On: 07/30/2022 15:08   ECHOCARDIOGRAM COMPLETE  Result Date: 07/30/2022    ECHOCARDIOGRAM REPORT   Patient Name:   JRAHAF CARBONELLDate of Exam: 07/30/2022 Medical Rec #:  [id number]           Height:       65.0 in Accession #:    [id number]          Weight:       320.0 lb Date of Birth:  [birth year]           BSA:          2.415 m Patient Age:    89years             BP:           106/81 mmHg Patient Gender: F                    HR:           70 bpm. Exam Location:  Inpatient Procedure: 2D Echo, Color Doppler and Cardiac Doppler  Indications:    chest pain  History:        Patient has prior history of Echocardiogram examinations, most                 recent 06/12/2022. CHF; Risk Factors:Sleep Apnea, Diabetes,                 Hypertension and Dyslipidemia.  Sonographer:    LJohny ChessRDCS Referring Phys: [id number]-MAIZ A THOMAS  Sonographer Comments: Patient is obese. Image acquisition challenging due to patient body habitus. IMPRESSIONS  1. Left ventricular ejection fraction, by estimation, is 60 to 65%. The left ventricle has normal function. The left ventricle has no regional wall motion abnormalities. There is mild left ventricular hypertrophy. Left ventricular diastolic parameters are consistent with Grade II diastolic dysfunction (pseudonormalization). Elevated left ventricular end-diastolic pressure.  2. Right ventricular systolic function is normal. The right ventricular size is mildly enlarged. Tricuspid regurgitation signal is inadequate for assessing PA pressure.  3. Left atrial size was severely dilated.  4. The mitral valve is degenerative. Trivial mitral valve regurgitation. No evidence of mitral stenosis.  5. The aortic valve is abnormal. Unable to determine aortic valve morphology due to image quality. There is moderate calcification of the aortic valve. Aortic valve regurgitation is trivial. Moderate aortic valve stenosis. Aortic valve area, by VTI measures 1.16 cm. Aortic valve mean gradient measures 31.0 mmHg. Aortic valve Vmax measures 3.Chelsea m/s.  6. The inferior vena cava is dilated in size with >50% respiratory variability, suggesting right atrial pressure of 8 mmHg.  7. Increased flow velocities may be secondary to anemia, thyrotoxicosis, hyperdynamic or high flow state. FINDINGS  Left Ventricle: Left ventricular ejection fraction, by estimation, is 60 to 65%. The left ventricle has normal  function. The left ventricle has no regional wall motion abnormalities. The left ventricular internal cavity size was  normal in size. There is  mild left ventricular hypertrophy. Left ventricular diastolic parameters are consistent with Grade II diastolic dysfunction (pseudonormalization). Elevated left ventricular end-diastolic pressure. Right Ventricle: The right ventricular size is mildly enlarged. No increase in right ventricular wall thickness. Right ventricular systolic function is normal. Tricuspid regurgitation signal is inadequate for assessing PA pressure. Left Atrium: Left atrial size was severely dilated. Right Atrium: Right atrial size was normal in size. Pericardium: There is no evidence of pericardial effusion. Mitral Valve: The mitral valve is degenerative in appearance. Mild mitral annular calcification. Trivial mitral valve regurgitation. No evidence of mitral valve stenosis. Tricuspid Valve: The tricuspid valve is normal in structure. Tricuspid valve regurgitation is trivial. No evidence of tricuspid stenosis. Aortic Valve: The aortic valve is abnormal. There is moderate calcification of the aortic valve. Aortic valve regurgitation is trivial. Moderate aortic stenosis is present. Aortic valve mean gradient measures 31.0 mmHg. Aortic valve peak gradient measures 51.3 mmHg. Aortic valve area, by VTI measures 1.16 cm. Pulmonic Valve: The pulmonic valve was normal in structure. Pulmonic valve regurgitation is trivial. No evidence of pulmonic stenosis. Aorta: The aortic root is normal in size and structure. Venous: The inferior vena cava is dilated in size with greater than 50% respiratory variability, suggesting right atrial pressure of 8 mmHg. IAS/Shunts: The atrial septum is grossly normal.  LEFT VENTRICLE PLAX 2D LVIDd:         4.90 cm   Diastology LVIDs:         3.30 cm   LV e' medial:    6.64 cm/s LV PW:         1.10 cm   LV E/e' medial:  18.7 LV IVS:        1.20 cm   LV e' lateral:   9.68 cm/s LVOT diam:     1.90 cm   LV E/e' lateral: 12.8 LV SV:         94 LV SV Index:   39 LVOT Area:     2.84 cm  RIGHT  VENTRICLE             IVC RV Basal diam:  3.10 cm     IVC diam: 2.20 cm RV S prime:     13.10 cm/s TAPSE (M-mode): 2.7 cm LEFT ATRIUM              Index        RIGHT ATRIUM           Index LA diam:        4.60 cm  1.90 cm/m   RA Area:     20.70 cm LA Vol (A2C):   118.0 ml 48.85 ml/m  RA Volume:   61.00 ml  25.25 ml/m LA Vol (A4C):   109.0 ml 45.13 ml/m LA Biplane Vol: 122.0 ml 50.51 ml/m  AORTIC VALVE AV Area (Vmax):    1.12 cm AV Area (Vmean):   1.03 cm AV Area (VTI):     1.16 cm AV Vmax:           358.00 cm/s AV Vmean:          265.000 cm/s AV VTI:            0.807 m AV Peak Grad:      51.3 mmHg AV Mean Grad:      31.0 mmHg LVOT Vmax:  141.50 cm/s LVOT Vmean:        96.600 cm/s LVOT VTI:          0.331 m LVOT/AV VTI ratio: 0.41  AORTA Ao Root diam: 2.70 cm Ao Asc diam:  3.60 cm MITRAL VALVE MV Area (PHT): 3.77 cm     SHUNTS MV Decel Time: 201 msec     Systemic VTI:  0.33 m MV E velocity: 124.00 cm/s  Systemic Diam: 1.90 cm MV A velocity: 117.00 cm/s MV E/A ratio:  1.06 Cherlynn Kaiser MD Electronically signed by Cherlynn Kaiser MD Signature Date/Time: 07/30/2022/1:34:33 PM    Final    CT Chest Wo Contrast  Result Date: 07/30/2022 CLINICAL DATA:  Respiratory illness, nondiagnostic xray EXAM: CT CHEST WITHOUT CONTRAST TECHNIQUE: Multidetector CT imaging of the chest was performed following the standard protocol without IV contrast. RADIATION DOSE REDUCTION: This exam was performed according to the departmental dose-optimization program which includes automated exposure control, adjustment of the mA and/or kV according to patient size and/or use of iterative reconstruction technique. COMPARISON:  Chest x-ray today.  CT 01/28/2022 FINDINGS: Cardiovascular: Heart is normal size. Aorta normal caliber. Scattered coronary artery and aortic calcifications. Mediastinum/Nodes: Small scattered mediastinal lymph nodes. No axillary or visible hilar adenopathy. Lungs/Pleura: Calcified granuloma in the left  upper lobe. Small left pleural effusion with compressive atelectasis in the left lower lobe. Linear areas of atelectasis or scarring in the upper lobes. Upper Abdomen: No acute findings Musculoskeletal: Chest wall soft tissues are unremarkable. No acute bony abnormality. IMPRESSION: Small left pleural effusion with left lower lobe atelectasis. Scattered areas of atelectasis or scarring in the right lung and left upper lobe. Aortic Atherosclerosis (ICD10-I70.0). Electronically Signed   By: Rolm Baptise M.D.   On: 07/30/2022 00:04   DG Chest 1 View  Result Date: 07/29/2022 CLINICAL DATA:  Abnormal chest radiograph. EXAM: CHEST  1 VIEW COMPARISON:  Chest radiograph dated 07/19/2022. FINDINGS: Cardiomegaly with vascular congestion. No focal consolidation, pleural effusion, or pneumothorax. No acute osseous pathology. IMPRESSION: Cardiomegaly with vascular congestion. Electronically Signed   By: Anner Crete M.D.   On: 07/29/2022 20:55   DG CHEST PORT 1 VIEW  Result Date: 07/19/2022 CLINICAL DATA:  Cough history of asthma EXAM: PORTABLE CHEST 1 VIEW COMPARISON:  07/04/2022 FINDINGS: Cardiomegaly with vascular congestion. No consolidation, pleural effusion, or pneumothorax. Streaky atelectasis at the right base. Aortic atherosclerosis IMPRESSION: Cardiomegaly with vascular congestion. Streaky atelectasis right base Electronically Signed   By: Donavan Foil M.D.   On: 07/19/2022 15:34   DG Knee Right Port  Result Date: 07/04/2022 CLINICAL DATA:  Right knee injury yesterday, limited range of motion EXAM: PORTABLE RIGHT KNEE - 1-2 VIEW COMPARISON:  07/03/2021 FINDINGS: Frontal, bilateral oblique, and lateral views of the right knee are obtained. No acute fracture, subluxation, or dislocation. 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 No joint effusion. There is diffuse subcutaneous edema. IMPRESSION: 1. Stable severe 3 compartmental osteoarthritis. 2. No acute bony abnormality.  Electronically Signed   By: Randa Ngo M.D.   On: 07/04/2022 18:Chelsea   DG Chest Port 1 View  Result Date: 07/04/2022 CLINICAL DATA:  Hypoxia, history of pneumonia, short of breath EXAM: PORTABLE CHEST 1 VIEW COMPARISON:  04/18/2022 FINDINGS: Single frontal view of the chest demonstrates an enlarged cardiac silhouette. There is increased central vascular congestion with bilateral perihilar airspace disease. No large effusion or pneumothorax. No acute bony abnormalities. IMPRESSION: 1. Constellation of findings most consistent with congestive heart failure. Superimposed pneumonia would  be difficult to exclude. Electronically Signed   By: Randa Ngo M.D.   On: 07/04/2022 18:57    Microbiology: Results for orders placed or performed during the hospital encounter of 07/29/22  Gastrointestinal Panel by PCR , Stool     Status: None   Collection Time: 07/31/22  2:10 AM   Specimen: Stool  Result Value Ref Range Status   Campylobacter species NOT DETECTED NOT DETECTED Final   Plesimonas shigelloides NOT DETECTED NOT DETECTED Final   Salmonella species NOT DETECTED NOT DETECTED Final   Yersinia enterocolitica NOT DETECTED NOT DETECTED Final   Vibrio species NOT DETECTED NOT DETECTED Final   Vibrio cholerae NOT DETECTED NOT DETECTED Final   Enteroaggregative E coli (EAEC) NOT DETECTED NOT DETECTED Final   Enteropathogenic E coli (EPEC) NOT DETECTED NOT DETECTED Final   Enterotoxigenic E coli (ETEC) NOT DETECTED NOT DETECTED Final   Shiga like toxin producing E coli (STEC) NOT DETECTED NOT DETECTED Final   Shigella/Enteroinvasive E coli (EIEC) NOT DETECTED NOT DETECTED Final   Cryptosporidium NOT DETECTED NOT DETECTED Final   Cyclospora cayetanensis NOT DETECTED NOT DETECTED Final   Entamoeba histolytica NOT DETECTED NOT DETECTED Final   Giardia lamblia NOT DETECTED NOT DETECTED Final   Adenovirus F40/41 NOT DETECTED NOT DETECTED Final   Astrovirus NOT DETECTED NOT DETECTED Final   Norovirus  GI/GII NOT DETECTED NOT DETECTED Final   Rotavirus A NOT DETECTED NOT DETECTED Final   Sapovirus (I, II, IV, and V) NOT DETECTED NOT DETECTED Final    Comment: Performed at [facility], [address] [place], [place] [address]    Labs: CBC: Recent Labs  Lab 07/29/22 2122 07/29/22 2129 07/30/22 0201 08/01/22 0054  WBC 5.4  --  3.9* 4.5  NEUTROABS 2.9  --   --   --   HGB 8.8* 10.2*  9.9* 12.1 10.1*  HCT 30.5* 30.0*  29.0* 41.2 34.1*  MCV 92.7  --  90.7 90.5  PLT 212  --  146* 962   Basic Metabolic Panel: Recent Labs  Lab 07/29/22 2122 07/29/22 2129 07/30/22 0201 07/30/22 0755 07/31/22 0042 08/01/22 0054 08/02/22 0042 08/03/22 0034  NA 142   < > 141  --  142 140 138 138  K 3.8   < > 3.6  --  4.7 3.8 4.1 4.0  CL 96*   < > 95*  --  97* 98 94* 94*  CO2 38*  --  36*  --  31 33* 34* 35*  GLUCOSE 163*   < > 104*  --  145* 151* 124* 141*  BUN 11   < > 13  --  _0 CREATININE 0.68   < > 0.76  --  0.59 0.64 0.76 0.73  CALCIUM 8.2*  --  8.4*  --  9.1 9.0 8.6* 8.8*  MG 1.5*  --   --  1.9  --   --  1.9  --    < > = values in this interval not displayed.   Liver Function Tests: Recent Labs  Lab 07/29/22 2122 07/30/22 0201  AST 16 14*  ALT 9 9  ALKPHOS 70 69  BILITOT 0.3 0.5  PROT 6.0* 6.1*  ALBUMIN 2.9* 2.8*   CBG: Recent Labs  Lab 07/30/22 0750 07/31/22 0609 07/31/22 1126 08/01/22 0634  GLUCAP 123* 128* 160* 149*    Discharge time spent: greater than 30 minutes.  Signed: Tawni Millers, MD [facility] 08/03/2022

## 2022-08-03 NOTE — TOC Transition Note (Signed)
Transition of Care [facility]) - CM/SW Discharge Note   Patient Details  Name: LERONDA LEWERS MRN: [id number] Date of Birth: Dec 07, 1965  Transition of Care [facility]) CM/SW Contact:  Amador Cunas, [place] Phone Number: 08/03/2022, 10:33 AM   Clinical Narrative:   Pt for dc back to [facility] where she is a STR resident. Pt aware of dc and reports agreeable. Spoke to Kia at [facility] who confirmed they are prepared to admit pt to room 101b. RN provided with number for report and [facility]AR arranged for transport. SW signing off at dc.   Wandra Feinstein, MSW, LCSW [phone] (coverage)      Final next level of care: Skilled Nursing Facility Barriers to Discharge: No Barriers Identified   Patient Goals and CMS Choice     Choice offered to / list presented to : Patient  Discharge Placement              Patient chooses bed at: [facility] Patient to be transferred to facility by: [place] Name of family member notified: Pt to update family Patient and family notified of of transfer: 08/03/22  Discharge Plan and Services     Post Acute Care Choice: [place]                               Social Determinants of Health (SDOH) Interventions     Readmission Risk Interventions    07/23/2022    2:19 PM 04/09/2022    4:17 PM  Readmission Risk Prevention Plan  Transportation Screening Complete Complete  PCP or Specialist Appt within 3-5 Days  Complete  HRI or [place]  Complete  Social Work Consult for [place] Planning/Counseling  Complete  Palliative Care Screening  Not Applicable  Medication Review Press photographer) Complete Referral to Pharmacy  PCP or Specialist appointment within 3-5 days of discharge Complete   HRI or [place] Complete   SW Recovery Care/Counseling Consult Complete   Palliative Care Screening Not [place] Complete

## 2022-08-03 NOTE — Care Management Important Message (Signed)
Important Message  Patient Details  Name: Chelsea [name] MRN: [id number] Date of Birth: [birth year]   Medicare Important Message Given:  Yes     [name] 08/03/2022, 8:53 AM

## 2022-09-17 ENCOUNTER — Encounter (HOSPITAL_COMMUNITY): Payer: Self-pay

## 2022-09-17 ENCOUNTER — Other Ambulatory Visit: Payer: Self-pay

## 2022-09-17 ENCOUNTER — Emergency Department (HOSPITAL_COMMUNITY): Payer: Medicare Other

## 2022-09-17 ENCOUNTER — Inpatient Hospital Stay (HOSPITAL_COMMUNITY)
Admission: EM | Admit: 2022-09-17 | Discharge: 2022-10-02 | DRG: 189 | Disposition: A | Discharge status: Skilled Nursing Facility | Payer: Medicare Other | Source: Skilled Nursing Facility | Attending: Internal Medicine | Admitting: Internal Medicine

## 2022-09-17 DIAGNOSIS — R051 Acute cough: Principal | ICD-10-CM

## 2022-09-17 DIAGNOSIS — J9811 Atelectasis: Secondary | ICD-10-CM | POA: Diagnosis present

## 2022-09-17 DIAGNOSIS — Z6841 Body Mass Index (BMI) 40.0 and over, adult: Secondary | ICD-10-CM | POA: Diagnosis not present

## 2022-09-17 DIAGNOSIS — R0602 Shortness of breath: Secondary | ICD-10-CM | POA: Diagnosis present

## 2022-09-17 DIAGNOSIS — Z7901 Long term (current) use of anticoagulants: Secondary | ICD-10-CM

## 2022-09-17 DIAGNOSIS — Z8679 Personal history of other diseases of the circulatory system: Secondary | ICD-10-CM | POA: Diagnosis present

## 2022-09-17 DIAGNOSIS — I10 Essential (primary) hypertension: Secondary | ICD-10-CM | POA: Diagnosis present

## 2022-09-17 DIAGNOSIS — E1165 Type 2 diabetes mellitus with hyperglycemia: Secondary | ICD-10-CM | POA: Diagnosis not present

## 2022-09-17 DIAGNOSIS — F32A Depression, unspecified: Secondary | ICD-10-CM | POA: Diagnosis present

## 2022-09-17 DIAGNOSIS — J9621 Acute and chronic respiratory failure with hypoxia: Secondary | ICD-10-CM | POA: Diagnosis present

## 2022-09-17 DIAGNOSIS — E1142 Type 2 diabetes mellitus with diabetic polyneuropathy: Secondary | ICD-10-CM

## 2022-09-17 DIAGNOSIS — E876 Hypokalemia: Secondary | ICD-10-CM | POA: Diagnosis not present

## 2022-09-17 DIAGNOSIS — I2510 Atherosclerotic heart disease of native coronary artery without angina pectoris: Secondary | ICD-10-CM | POA: Diagnosis present

## 2022-09-17 DIAGNOSIS — E873 Alkalosis: Secondary | ICD-10-CM | POA: Diagnosis present

## 2022-09-17 DIAGNOSIS — I480 Paroxysmal atrial fibrillation: Secondary | ICD-10-CM | POA: Diagnosis present

## 2022-09-17 DIAGNOSIS — G8929 Other chronic pain: Secondary | ICD-10-CM | POA: Diagnosis present

## 2022-09-17 DIAGNOSIS — L03114 Cellulitis of left upper limb: Secondary | ICD-10-CM | POA: Diagnosis present

## 2022-09-17 DIAGNOSIS — D509 Iron deficiency anemia, unspecified: Secondary | ICD-10-CM | POA: Diagnosis not present

## 2022-09-17 DIAGNOSIS — J449 Chronic obstructive pulmonary disease, unspecified: Secondary | ICD-10-CM | POA: Diagnosis present

## 2022-09-17 DIAGNOSIS — B1920 Unspecified viral hepatitis C without hepatic coma: Secondary | ICD-10-CM | POA: Diagnosis present

## 2022-09-17 DIAGNOSIS — I878 Other specified disorders of veins: Secondary | ICD-10-CM | POA: Diagnosis present

## 2022-09-17 DIAGNOSIS — L03115 Cellulitis of right lower limb: Secondary | ICD-10-CM | POA: Diagnosis present

## 2022-09-17 DIAGNOSIS — I110 Hypertensive heart disease with heart failure: Secondary | ICD-10-CM | POA: Diagnosis present

## 2022-09-17 DIAGNOSIS — J441 Chronic obstructive pulmonary disease with (acute) exacerbation: Secondary | ICD-10-CM | POA: Diagnosis present

## 2022-09-17 DIAGNOSIS — T380X5A Adverse effect of glucocorticoids and synthetic analogues, initial encounter: Secondary | ICD-10-CM | POA: Diagnosis not present

## 2022-09-17 DIAGNOSIS — Z8541 Personal history of malignant neoplasm of cervix uteri: Secondary | ICD-10-CM

## 2022-09-17 DIAGNOSIS — Z9049 Acquired absence of other specified parts of digestive tract: Secondary | ICD-10-CM

## 2022-09-17 DIAGNOSIS — E039 Hypothyroidism, unspecified: Secondary | ICD-10-CM | POA: Diagnosis not present

## 2022-09-17 DIAGNOSIS — E785 Hyperlipidemia, unspecified: Secondary | ICD-10-CM

## 2022-09-17 DIAGNOSIS — Z5329 Procedure and treatment not carried out because of patient's decision for other reasons: Secondary | ICD-10-CM | POA: Diagnosis present

## 2022-09-17 DIAGNOSIS — Z79899 Other long term (current) drug therapy: Secondary | ICD-10-CM

## 2022-09-17 DIAGNOSIS — Y92239 Unspecified place in hospital as the place of occurrence of the external cause: Secondary | ICD-10-CM | POA: Diagnosis not present

## 2022-09-17 DIAGNOSIS — Z1152 Encounter for screening for COVID-19: Secondary | ICD-10-CM

## 2022-09-17 DIAGNOSIS — I5033 Acute on chronic diastolic (congestive) heart failure: Secondary | ICD-10-CM | POA: Diagnosis not present

## 2022-09-17 DIAGNOSIS — Z794 Long term (current) use of insulin: Secondary | ICD-10-CM | POA: Diagnosis not present

## 2022-09-17 DIAGNOSIS — I4892 Unspecified atrial flutter: Secondary | ICD-10-CM | POA: Diagnosis present

## 2022-09-17 DIAGNOSIS — G253 Myoclonus: Secondary | ICD-10-CM | POA: Diagnosis present

## 2022-09-17 DIAGNOSIS — I2781 Cor pulmonale (chronic): Secondary | ICD-10-CM | POA: Diagnosis not present

## 2022-09-17 DIAGNOSIS — Z9981 Dependence on supplemental oxygen: Secondary | ICD-10-CM | POA: Diagnosis not present

## 2022-09-17 DIAGNOSIS — F419 Anxiety disorder, unspecified: Secondary | ICD-10-CM | POA: Diagnosis present

## 2022-09-17 DIAGNOSIS — Z90710 Acquired absence of both cervix and uterus: Secondary | ICD-10-CM

## 2022-09-17 DIAGNOSIS — Z91199 Patient's noncompliance with other medical treatment and regimen due to unspecified reason: Secondary | ICD-10-CM

## 2022-09-17 DIAGNOSIS — I890 Lymphedema, not elsewhere classified: Secondary | ICD-10-CM

## 2022-09-17 DIAGNOSIS — E662 Morbid (severe) obesity with alveolar hypoventilation: Secondary | ICD-10-CM | POA: Diagnosis present

## 2022-09-17 DIAGNOSIS — I071 Rheumatic tricuspid insufficiency: Secondary | ICD-10-CM | POA: Diagnosis present

## 2022-09-17 DIAGNOSIS — I50813 Acute on chronic right heart failure: Secondary | ICD-10-CM | POA: Diagnosis not present

## 2022-09-17 DIAGNOSIS — M545 Low back pain, unspecified: Secondary | ICD-10-CM | POA: Diagnosis present

## 2022-09-17 DIAGNOSIS — I5042 Chronic combined systolic (congestive) and diastolic (congestive) heart failure: Secondary | ICD-10-CM | POA: Diagnosis present

## 2022-09-17 DIAGNOSIS — E66813 Obesity, class 3: Secondary | ICD-10-CM

## 2022-09-17 DIAGNOSIS — I5082 Biventricular heart failure: Secondary | ICD-10-CM | POA: Diagnosis present

## 2022-09-17 DIAGNOSIS — K580 Irritable bowel syndrome with diarrhea: Secondary | ICD-10-CM | POA: Diagnosis present

## 2022-09-17 DIAGNOSIS — Z8249 Family history of ischemic heart disease and other diseases of the circulatory system: Secondary | ICD-10-CM

## 2022-09-17 DIAGNOSIS — I350 Nonrheumatic aortic (valve) stenosis: Secondary | ICD-10-CM | POA: Diagnosis present

## 2022-09-17 DIAGNOSIS — G4733 Obstructive sleep apnea (adult) (pediatric): Secondary | ICD-10-CM | POA: Diagnosis present

## 2022-09-17 DIAGNOSIS — Z7984 Long term (current) use of oral hypoglycemic drugs: Secondary | ICD-10-CM

## 2022-09-17 DIAGNOSIS — E119 Type 2 diabetes mellitus without complications: Secondary | ICD-10-CM

## 2022-09-17 DIAGNOSIS — J9622 Acute and chronic respiratory failure with hypercapnia: Secondary | ICD-10-CM | POA: Diagnosis present

## 2022-09-17 DIAGNOSIS — E1169 Type 2 diabetes mellitus with other specified complication: Secondary | ICD-10-CM

## 2022-09-17 DIAGNOSIS — K219 Gastro-esophageal reflux disease without esophagitis: Secondary | ICD-10-CM | POA: Diagnosis present

## 2022-09-17 DIAGNOSIS — I358 Other nonrheumatic aortic valve disorders: Secondary | ICD-10-CM | POA: Diagnosis present

## 2022-09-17 LAB — I-STAT VENOUS BLOOD GAS, ED
Acid-Base Excess: 24 mmol/L — ABNORMAL HIGH (ref 0.0–2.0)
Bicarbonate: 50 mmol/L — ABNORMAL HIGH (ref 20.0–28.0)
Calcium, Ion: 1 mmol/L — ABNORMAL LOW (ref 1.15–1.40)
HCT: 29 % — ABNORMAL LOW (ref 36.0–46.0)
Hemoglobin: 9.9 g/dL — ABNORMAL LOW (ref 12.0–15.0)
O2 Saturation: 99 %
Potassium: 4.2 mmol/L (ref 3.5–5.1)
Sodium: 138 mmol/L (ref 135–145)
TCO2: >50 mmol/L — ABNORMAL HIGH (ref 22–32)
pCO2, Ven: 60.8 mmHg — ABNORMAL HIGH (ref 44–60)
pH, Ven: 7.523 — ABNORMAL HIGH (ref 7.25–7.43)
pO2, Ven: 137 mmHg — ABNORMAL HIGH (ref 32–45)

## 2022-09-17 LAB — COMPREHENSIVE METABOLIC PANEL
ALT: 7 U/L (ref 0–44)
AST: 12 U/L — ABNORMAL LOW (ref 15–41)
Albumin: 3 g/dL — ABNORMAL LOW (ref 3.5–5.0)
Alkaline Phosphatase: 80 U/L (ref 38–126)
Anion gap: 10 (ref 5–15)
BUN: 17 mg/dL (ref 6–20)
CO2: 44 mmol/L — ABNORMAL HIGH (ref 22–32)
Calcium: 8.5 mg/dL — ABNORMAL LOW (ref 8.9–10.3)
Chloride: 85 mmol/L — ABNORMAL LOW (ref 98–111)
Creatinine, Ser: 0.8 mg/dL (ref 0.44–1.00)
GFR, Estimated: >60 mL/min (ref >60)
Glucose, Bld: 193 mg/dL — ABNORMAL HIGH (ref 70–99)
Potassium: 4 mmol/L (ref 3.5–5.1)
Sodium: 139 mmol/L (ref 135–145)
Total Bilirubin: 0.5 mg/dL (ref 0.3–1.2)
Total Protein: 6.7 g/dL (ref 6.5–8.1)

## 2022-09-17 LAB — BRAIN NATRIURETIC PEPTIDE: B Natriuretic Peptide: 143.7 pg/mL — ABNORMAL HIGH (ref 0.0–100.0)

## 2022-09-17 LAB — CBC WITH DIFFERENTIAL/PLATELET
Abs Immature Granulocytes: 0.02 10*3/uL (ref 0.00–0.07)
Basophils Absolute: 0 10*3/uL (ref 0.0–0.1)
Basophils Relative: 0 %
Eosinophils Absolute: 0.1 10*3/uL (ref 0.0–0.5)
Eosinophils Relative: 1 %
HCT: 29.3 % — ABNORMAL LOW (ref 36.0–46.0)
Hemoglobin: 8.4 g/dL — ABNORMAL LOW (ref 12.0–15.0)
Immature Granulocytes: 0 %
Lymphocytes Relative: 24 %
Lymphs Abs: 1.5 10*3/uL (ref 0.7–4.0)
MCH: 26.3 pg (ref 26.0–34.0)
MCHC: 28.7 g/dL — ABNORMAL LOW (ref 30.0–36.0)
MCV: 91.6 fL (ref 80.0–100.0)
Monocytes Absolute: 0.6 10*3/uL (ref 0.1–1.0)
Monocytes Relative: 10 %
Neutro Abs: 3.9 10*3/uL (ref 1.7–7.7)
Neutrophils Relative %: 65 %
Platelets: 236 10*3/uL (ref 150–400)
RBC: 3.2 MIL/uL — ABNORMAL LOW (ref 3.87–5.11)
RDW: 15.9 % — ABNORMAL HIGH (ref 11.5–15.5)
WBC: 6 10*3/uL (ref 4.0–10.5)
nRBC: 0 % (ref 0.0–0.2)

## 2022-09-17 LAB — TROPONIN I (HIGH SENSITIVITY)
Troponin I (High Sensitivity): 5 ng/L (ref <18)
Troponin I (High Sensitivity): 6 ng/L (ref <18)

## 2022-09-17 LAB — RESP PANEL BY RT-PCR (RSV, FLU A&B, COVID)  RVPGX2
Influenza A by PCR: NEGATIVE
Influenza B by PCR: NEGATIVE
Resp Syncytial Virus by PCR: NEGATIVE
SARS Coronavirus 2 by RT PCR: NEGATIVE

## 2022-09-17 LAB — MAGNESIUM: Magnesium: 1.8 mg/dL (ref 1.7–2.4)

## 2022-09-17 MED ORDER — IPRATROPIUM-ALBUTEROL 0.5-2.5 (3) MG/3ML IN SOLN
3.0000 mL | Freq: Once | RESPIRATORY_TRACT | Status: AC
  Administered 2022-09-17: 3 mL via RESPIRATORY_TRACT
  Filled 2022-09-17: qty 3

## 2022-09-17 MED ORDER — SENNOSIDES-DOCUSATE SODIUM 8.6-50 MG PO TABS
1.0000 | ORAL_TABLET | Freq: Two times a day (BID) | ORAL | Status: DC
  Administered 2022-09-18: 1 via ORAL
  Filled 2022-09-17 (×8): qty 1

## 2022-09-17 MED ORDER — APIXABAN 5 MG PO TABS
5.0000 mg | ORAL_TABLET | Freq: Two times a day (BID) | ORAL | Status: DC
  Administered 2022-09-18 – 2022-09-19 (×3): 5 mg via ORAL
  Filled 2022-09-17 (×4): qty 1

## 2022-09-17 MED ORDER — POTASSIUM CHLORIDE CRYS ER 20 MEQ PO TBCR
20.0000 meq | EXTENDED_RELEASE_TABLET | Freq: Once | ORAL | Status: AC
  Administered 2022-09-18: 20 meq via ORAL
  Filled 2022-09-17: qty 1

## 2022-09-17 MED ORDER — ALBUTEROL SULFATE (2.5 MG/3ML) 0.083% IN NEBU: 2.5000 mg | INHALATION_SOLUTION | RESPIRATORY_TRACT | Status: DC | PRN

## 2022-09-17 MED ORDER — SODIUM CHLORIDE 0.9 % IV SOLN
500.0000 mg | INTRAVENOUS | Status: DC
  Administered 2022-09-18 – 2022-09-19 (×2): 500 mg via INTRAVENOUS
  Filled 2022-09-17 (×2): qty 5

## 2022-09-17 MED ORDER — MAGNESIUM SULFATE 2 GM/50ML IV SOLN
2.0000 g | Freq: Once | INTRAVENOUS | Status: AC
  Administered 2022-09-18: 2 g via INTRAVENOUS
  Filled 2022-09-17: qty 50

## 2022-09-17 MED ORDER — MELATONIN 3 MG PO TABS
3.0000 mg | ORAL_TABLET | Freq: Every evening | ORAL | Status: DC | PRN
  Administered 2022-09-19 – 2022-10-01 (×5): 3 mg via ORAL
  Filled 2022-09-17 (×7): qty 1

## 2022-09-17 MED ORDER — LORAZEPAM 2 MG/ML IJ SOLN
0.5000 mg | INTRAMUSCULAR | Status: DC | PRN
  Administered 2022-09-17 – 2022-09-23 (×6): 0.5 mg via INTRAVENOUS
  Filled 2022-09-17 (×6): qty 1

## 2022-09-17 MED ORDER — OXYCODONE-ACETAMINOPHEN 5-325 MG PO TABS
1.0000 | ORAL_TABLET | Freq: Once | ORAL | Status: AC
  Administered 2022-09-17: 1 via ORAL
  Filled 2022-09-17: qty 1

## 2022-09-17 MED ORDER — SODIUM CHLORIDE 0.9 % IV SOLN
500.0000 mg | Freq: Once | INTRAVENOUS | Status: AC
  Administered 2022-09-17: 500 mg via INTRAVENOUS
  Filled 2022-09-17: qty 5

## 2022-09-17 MED ORDER — FERROUS SULFATE 325 (65 FE) MG PO TABS
325.0000 mg | ORAL_TABLET | Freq: Every day | ORAL | Status: DC
  Administered 2022-09-18 – 2022-10-02 (×15): 325 mg via ORAL
  Filled 2022-09-17 (×16): qty 1

## 2022-09-17 MED ORDER — ACETAMINOPHEN 650 MG RE SUPP: 650.0000 mg | Freq: Four times a day (QID) | RECTAL | Status: DC | PRN

## 2022-09-17 MED ORDER — HYDROCODONE-ACETAMINOPHEN 5-325 MG PO TABS: 1.0000 | ORAL_TABLET | Freq: Four times a day (QID) | ORAL | Status: DC | PRN

## 2022-09-17 MED ORDER — METHYLPREDNISOLONE SODIUM SUCC 125 MG IJ SOLR
80.0000 mg | Freq: Two times a day (BID) | INTRAMUSCULAR | Status: DC
  Administered 2022-09-18: 80 mg via INTRAVENOUS
  Filled 2022-09-17: qty 2

## 2022-09-17 MED ORDER — INSULIN ASPART 100 UNIT/ML IJ SOLN
0.0000 [IU] | Freq: Three times a day (TID) | INTRAMUSCULAR | Status: DC
  Administered 2022-09-18: 5 [IU] via SUBCUTANEOUS
  Administered 2022-09-18: 3 [IU] via SUBCUTANEOUS
  Administered 2022-09-18: 5 [IU] via SUBCUTANEOUS
  Administered 2022-09-19: 3 [IU] via SUBCUTANEOUS
  Administered 2022-09-19: 2 [IU] via SUBCUTANEOUS
  Administered 2022-09-19: 3 [IU] via SUBCUTANEOUS
  Administered 2022-09-20: 5 [IU] via SUBCUTANEOUS
  Administered 2022-09-20: 1 [IU] via SUBCUTANEOUS
  Administered 2022-09-20: 2 [IU] via SUBCUTANEOUS
  Administered 2022-09-21: 1 [IU] via SUBCUTANEOUS
  Administered 2022-09-21: 3 [IU] via SUBCUTANEOUS
  Administered 2022-09-21: 1 [IU] via SUBCUTANEOUS
  Administered 2022-09-22: 2 [IU] via SUBCUTANEOUS
  Administered 2022-09-22: 5 [IU] via SUBCUTANEOUS

## 2022-09-17 MED ORDER — FUROSEMIDE 10 MG/ML IJ SOLN
80.0000 mg | Freq: Once | INTRAMUSCULAR | Status: AC
  Administered 2022-09-17: 80 mg via INTRAVENOUS
  Filled 2022-09-17: qty 8

## 2022-09-17 MED ORDER — METHYLPREDNISOLONE SODIUM SUCC 125 MG IJ SOLR
125.0000 mg | Freq: Once | INTRAMUSCULAR | Status: AC
  Administered 2022-09-17: 125 mg via INTRAVENOUS
  Filled 2022-09-17: qty 2

## 2022-09-17 MED ORDER — LEVOTHYROXINE SODIUM 75 MCG PO TABS
150.0000 ug | ORAL_TABLET | Freq: Every day | ORAL | Status: DC
  Administered 2022-09-18 – 2022-10-02 (×15): 150 ug via ORAL
  Filled 2022-09-17 (×16): qty 2

## 2022-09-17 MED ORDER — IPRATROPIUM-ALBUTEROL 0.5-2.5 (3) MG/3ML IN SOLN
3.0000 mL | Freq: Four times a day (QID) | RESPIRATORY_TRACT | Status: DC
  Administered 2022-09-18 – 2022-09-19 (×6): 3 mL via RESPIRATORY_TRACT
  Filled 2022-09-17 (×6): qty 3

## 2022-09-17 MED ORDER — ACETAMINOPHEN 325 MG PO TABS
650.0000 mg | ORAL_TABLET | Freq: Four times a day (QID) | ORAL | Status: DC | PRN
  Administered 2022-09-18 – 2022-09-26 (×3): 650 mg via ORAL
  Filled 2022-09-17 (×3): qty 2

## 2022-09-17 MED ORDER — GABAPENTIN 400 MG PO CAPS
400.0000 mg | ORAL_CAPSULE | Freq: Three times a day (TID) | ORAL | Status: DC
  Administered 2022-09-18 – 2022-10-02 (×43): 400 mg via ORAL
  Filled 2022-09-17 (×45): qty 1

## 2022-09-17 MED ORDER — POLYETHYLENE GLYCOL 3350 17 G PO PACK: 17.0000 g | PACK | Freq: Every day | ORAL | Status: DC | PRN

## 2022-09-17 MED ORDER — FUROSEMIDE 10 MG/ML IJ SOLN
80.0000 mg | Freq: Two times a day (BID) | INTRAMUSCULAR | Status: DC
  Administered 2022-09-18 – 2022-09-23 (×11): 80 mg via INTRAVENOUS
  Filled 2022-09-17 (×11): qty 8

## 2022-09-17 NOTE — H&P (Incomplete)
History and Physical      Chelsea [name]:[id number] DOB: 1965-12-08 DOA: 09/17/2022  PCP: Pcp, No (will further assess) Patient coming from: SNF  I have personally briefly reviewed patient's old medical records in [place]  Chief Complaint: sob  HPI: Chelsea [name] is a 56 y.o. female with medical history significant for chronic hypoxic respiratory failure on 4 L continuous nasal cannula, chronic diastolic heart failure, COPD, obesity hypoventilation syndrome, obstructive sleep apnea unable to tolerate recommended BiPAP at night, moderate aortic stenosis, paroxysmal atrial fibrillation chronically anticoagulated on Eliquis, who is admitted to [facility] on 09/17/2022 with acute on chronic hypoxic respiratory failure due to acute on chronic diastolic heart failure as well as suspected contribution from COPD exacerbation,  after presenting from SNF to [facility] ED complaining of shortness of breath.   The patient reports 1 week of progressive shortness of breath associated with new onset cough, that has been mildly productive in the absence of any hemoptysis.  There that have requested she also notes slight worsening of her edema in the bilateral lower extremities.  Denies any associated subjective fever, chills, rigors, generalized myalgias.  No associated any chest pain, palpitations, diaphoresis, nausea, vomiting, dizziness dizziness, presyncope, or syncope.  Denies any new onset calf tenderness or new lower extremity erythema, and confirms that she is chronically anticoagulated on Eliquis in the setting of paroxysmal atrial fibrillation.  Additionally, it is noted that she underwent VQ scan on 07/30/2022, which showed no evidence of acute pulmonary embolism.  She has history of chronic diastolic heart failure as well as moderate aortic stenosis.  Most recent echocardiogram occurred on 07/30/2022 was notable for LVEF 60 to 65%, no focal localization-related, grade 2  diastolic dysfunction, normal right ventricular systolic function, severely dilated left atrium, trivial mitral regurgitation/aortic regurgitation as well as moderate aortic stenosis with aortic valve area by VTI of 1.16 cm2.  She has good compliance with home torsemide 40 mg p.o. daily and is also on spironolactone as an outpatient.  She also carries minimal pulmonary diagnoses, including COPD, obstructive sleep apnea as well as obesity hypoventilation syndrome.  She is on chronic 4 L continuous nasal cannula as her baseline, and notes that she is unable to tolerate the BiPAP that is recommended for her on a nightly basis.  Of note, during a recent hospitalization for evaluation of melena, she underwent EGD, which reportedly showed no evidence of acute upper GI bleeding source.  She notes interval resolution of melena since that time, denies any recent hematochezia.  No recent abdominal pain, nausea, vomiting, hematemesis.  In the setting of her progressive shortness of breath, EMS was called to SNF facility, at which time she was noted to be hypoxic on her baseline 4 L continuous nasal cannula, prompting EMS to increase to 6 L/min upon which she remained hypoxic with oxygen saturations in the mid 80s, she was socially brought to [place] emergency department for further evaluation and management, at which time she was transitioned to nonrebreather, as further detailed below     ED Course:  Vital signs in the ED were notable for the following: Temperature max 99.3; heart rate 81-19; systolic blood pressures in the low 100s to 1 teens; respiratory 16-22, 84 to 89% on 6 L nasal cannula, says improving to 97% on 15 L nonrebreather.  Labs were notable for the following: CMP notable for sodium 139, HCO3 44 compared to 35 on 08/03/2022, BUN 17 compared to 19 on 08/03/2022, creatinine 0.80,  calcium, adjusted for hypoalbuminemia noted to be 9.3, albumin 3.0, otherwise liver enzymes within normal limits.  BNP 143  compared to most recent prior value 64.3 on 07/29/2022.  High sensitive troponin I x 1 was found to be 5.  CBC notable for white cell count 6000, hemoglobin associated with normocytic finding and relative to his recent prior hemoglobin value of 10.1 on 08/01/2022.  COVID, influenza, RSV PCR all negative.  Per my interpretation, EKG in ED demonstrated the following: Relative to most recent EKG performed on 08/01/2022, today's EKG shows sinus rhythm with heart rate 86, normal intervals, nonspecific T wave inversion in V1 through V3, which was unchanged from most recent prior EKG and she has no evidence of ST changes, including evidence of ST elevation.  Imaging and additional notable ED work-up: Chest x-ray, in comparison to 1 view chest x-ray on 07/29/2022 and per formal radiology report shows interval development of mild pulmonary edema as well as left basilar atelectasis in the absence of overt infiltrate, pleural effusion, or pneumothorax.  While in the ED, the following were administered: Lasix 80 mg IV developed, no nebulization, solumedrol milligrams IV x 1, Percocet 5/325 mg p.o. x 1, azithromycin.  Subsequently, the patient was admitted for further evaluation and management of acute on chronic hypoxic respiratory failure in the setting of acute on chronic diastolic heart failure as well as suspected contribution from acute COPD exacerbation.      Review of Systems: As per HPI otherwise 10 point review of systems negative.   Past Medical History:  Diagnosis Date   Aortic stenosis    Echocardiogram 02/21/11:  Mean gradient 23 mm of mercury; peak gradient 36;; Turbulence noted in the area of the aortic isthmus with peak gradient 23 mmHg-consider MRI to assess for coarctation;    TEE: 04/02/11:  EF 55-60%, mod BAE, trileaflet AV with mild AS (pk and mean 25 and 16), mild AI, mild MR, mild TR, atrial septal aneurysm, no evidence of corarctation   Arthritis    "qwhere" (12/05/2017)   Asthma     Cervical cancer ([place]) 2006   CHF (congestive heart failure) (HCC)    Chronic lower back pain    Complication of anesthesia    "I have a hard time waking up from under it" (12/05/2017)   COPD (chronic obstructive pulmonary disease) ([place])    "lung dr said I don't have this" (12/05/2017)   Coronary artery disease    Diastolic congestive heart failure ([place])    Echo 02/21/11: EF 55-60%, moderate LVH, grade 2 diastolic dysfunction, mild to moderate aortic stenosis with a mean gradient 23 mm of mercury   GERD (gastroesophageal reflux disease)    Heart murmur    Hepatitis C    History of gout X 1   Hyperlipidemia    Hypertension    Hypotension arterial 04/07/2022   Hypothyroid    IBS (irritable bowel syndrome)    Migraine    "monthly" (12/05/2017)   Obesity hypoventilation syndrome ([place])    On home oxygen therapy    "2L all the time" (12/05/2017)   OSA treated with BiPAP    "have CPAP at home too; wearing BiPAP right now" (12/05/2017)   Pneumonia    "several times" (12/05/2017)   Type II diabetes mellitus ([place])     Past Surgical History:  Procedure Laterality Date   ABDOMINAL SURGERY  2006 X 2   "TAH incision was infected"   BIOPSY  07/21/2022   Procedure: BIOPSY;  Surgeon: Alessandra Bevels,  Orson Gear, MD;  Location: [facility];  Service: Gastroenterology;;   CHOLECYSTECTOMY OPEN     ESOPHAGOGASTRODUODENOSCOPY N/A 07/21/2022   Procedure: ESOPHAGOGASTRODUODENOSCOPY (EGD);  Surgeon: Otis Brace, MD;  Location: [facility];  Service: Gastroenterology;  Laterality: N/A;   TONSILLECTOMY     TOTAL ABDOMINAL HYSTERECTOMY  2006   TRACHEOSTOMY REVISION N/A 04/09/2022   Procedure: CONTROL OF BLEEDING,TRACHEOSTOMY SITE;  Surgeon: Melida Quitter, MD;  Location: [place];  Service: ENT;  Laterality: N/A;    Social History:  reports that she has never smoked. She has never used smokeless tobacco. She reports that she does not currently use alcohol. She reports that she does not use drugs.   Allergies   Allergen Reactions   Iodinated Contrast Media Anaphylaxis   Penicillins Anaphylaxis    Patient tolerates cefepime (08/2017)   Insulin Lispro Other (See Comments)    Adder per MAR from SNF   Minoxidil     Other reaction(s): hives    Family History  Problem Relation Age of Onset   Heart disease Father     Family history reviewed and not pertinent    Prior to Admission medications   Medication Sig Start Date End Date Taking? Authorizing Provider  acetaminophen (TYLENOL) 325 MG tablet Take 650 mg by mouth every 6 (six) hours as needed for mild pain.    [provider]  albuterol (PROVENTIL) (2.5 MG/3ML) 0.083% nebulizer solution Take 3 mLs (2.5 mg total) by nebulization every 6 (six) hours as needed for wheezing or shortness of breath (I50.32). 07/04/16   [name], MD  ALPRAZolam Duanne Moron) 0.5 MG tablet Take 1 tablet (0.5 mg total) by mouth every 8 (eight) hours as needed for anxiety. 08/03/22   Arrien, Jimmy Picket, MD  ammonium lactate (LAC-HYDRIN) 12 % lotion Apply 1 Application topically every evening. Apply to  legs and feet every evening shift    [provider]  budesonide (PULMICORT) 0.5 MG/2ML nebulizer solution Inhale 0.5 mg into the lungs every 12 (twelve) hours.    [provider]  Cholecalciferol 125 MCG (5000 UT) TABS Take 1 tablet (5,000 Units total) by mouth daily. 07/10/22   [name], [name], MD  cyanocobalamin (VITAMIN B12) 1000 MCG tablet Take 1 tablet (1,000 mcg total) by mouth daily. 07/10/22   [name], [name], MD  diclofenac Sodium (VOLTAREN) 1 % GEL Apply 4 g topically every 6 (six) hours as needed (pain).    [provider]  ELIQUIS 5 MG TABS tablet Take 5 mg by mouth 2 (two) times daily. 04/12/21   [provider]  ferrous sulfate 324 MG TBEC Take 324 mg by mouth daily.    [provider]  folic acid (FOLVITE) 1 MG tablet Take 1 tablet (1 mg total) by mouth daily. 07/10/22   [name], [name], MD   gabapentin (NEURONTIN) 400 MG capsule Take 400 mg by mouth 3 (three) times daily.    [provider]  HYDROcodone-acetaminophen (NORCO/VICODIN) 5-325 MG tablet Take 1 tablet by mouth every 6 (six) hours as needed for severe pain (for back and shoulder pain). 08/03/22   Arrien, Jimmy Picket, MD  insulin aspart (NOVOLOG) 100 UNIT/ML injection insulin aspart (novoLOG) injection 0-24 Units 0-24 Units, Subcutaneous, 3 times daily with meals, CBG < 70: Implement Hypoglycemia measures, call MD CBG < 120 (dose in units): 0 CBG 120 - 160 (dose in units): 2 CBG 161 - 200 (dose in units): 4 CBG 201 - 250 (dose in units): 8 CBG 251 - 300 (  dose in units): 12 CBG 301 - 350 (dose in units): 16 CBG 351 - 450 (dose in units): 20 CBG > 450: 24 units and obtain STAT glucose and notify MD 07/10/22   [name], MD  levothyroxine (SYNTHROID) 150 MCG tablet Take 1 tablet (150 mcg total) by mouth daily at 6 (six) AM. 07/10/22   [name], [name], MD  Menthol, Topical Analgesic, (BIOFREEZE EX) Apply 1 application  topically as needed (fo pain).    [provider]  pantoprazole (PROTONIX) 40 MG tablet Take 1 tablet (40 mg total) by mouth daily. 07/23/22   Mercy Riding, MD  polyethylene glycol (MIRALAX / GLYCOLAX) 17 g packet Take 17 g by mouth daily. 07/10/22   [name], [name], MD  potassium chloride SA (KLOR-CON M) 20 MEQ tablet Take 1 tablet (20 mEq total) by mouth daily. 07/10/22   [name], [name], MD  senna-docusate (SENOKOT-S) 8.6-50 MG tablet Take 2 tablets by mouth 2 (two) times daily. Patient taking differently: Take 1 tablet by mouth 2 (two) times daily. 07/10/22   [name], [name], MD  spironolactone (ALDACTONE) 25 MG tablet Take 0.5 tablets (12.5 mg total) by mouth daily. 08/04/22 09/03/22  Arrien, Jimmy Picket, MD  torsemide (DEMADEX) 20 MG tablet Take 40 mg by mouth daily.    [provider]     Objective    Physical Exam: Vitals:   09/17/22 1945  09/17/22 1949 09/17/22 1957 09/17/22 2030  BP: (!) 100/57 (!) 100/57  116/61  Pulse: 85 82  90  Resp: (!) 21 (!) 21  20  Temp:  98.3 F (36.8 C)    TempSrc:  Oral    SpO2: 97% 97% 97% 95%  Weight:      Height:        General: appears to be stated age; alert, oriented; mildly increased work of breathing noted Skin: warm, dry, no rash Head:  AT/[place] Mouth:  Oral mucosa membranes appear moist, normal dentition Neck: supple; trachea midline Heart:  RRR; did not appreciate any M/R/G Lungs: CTAB, did not appreciate any wheezes, rales, or rhonchi Abdomen: + BS; soft, ND, NT Vascular: 2+ pedal pulses b/l; 2+ radial pulses b/l Extremities: Trace edema in the bilateral lower extremities, no muscle wasting Neuro: strength and sensation intact in upper and lower extremities b/l   Labs on Admission: I have personally reviewed following labs and imaging studies  CBC: Recent Labs  Lab 09/17/22 1705 09/17/22 1939  WBC 6.0  --   NEUTROABS 3.9  --   HGB 8.4* 9.9*  HCT 29.3* 29.0*  MCV 91.6  --   PLT 236  --    Basic Metabolic Panel: Recent Labs  Lab 09/17/22 1705 09/17/22 1939  NA 139 138  K 4.0 4.2  CL 85*  --   CO2 44*  --   GLUCOSE 193*  --   BUN 17  --   CREATININE 0.80  --   CALCIUM 8.5*  --    GFR: Estimated Creatinine Clearance: 109.3 mL/min (by C-G formula based on SCr of 0.8 mg/dL). Liver Function Tests: Recent Labs  Lab 09/17/22 1705  AST 12*  ALT 7  ALKPHOS 80  BILITOT 0.5  PROT 6.7  ALBUMIN 3.0*   No results for input(s): "LIPASE", "AMYLASE" in the last 168 hours. No results for input(s): "AMMONIA" in the last 168 hours. Coagulation Profile: No results for input(s): "INR", "PROTIME" in the last 168 hours. Cardiac Enzymes: No results for input(s): "CKTOTAL", "CKMB", "  CKMBINDEX", "TROPONINI" in the last 168 hours. BNP (last 3 results) No results for input(s): "PROBNP" in the last 8760 hours. HbA1C: No results for input(s): "HGBA1C" in the last 72  hours. CBG: No results for input(s): "GLUCAP" in the last 168 hours. Lipid Profile: No results for input(s): "CHOL", "HDL", "LDLCALC", "TRIG", "CHOLHDL", "LDLDIRECT" in the last 72 hours. Thyroid Function Tests: No results for input(s): "TSH", "T4TOTAL", "FREET4", "T3FREE", "THYROIDAB" in the last 72 hours. Anemia Panel: No results for input(s): "VITAMINB12", "FOLATE", "FERRITIN", "TIBC", "IRON", "RETICCTPCT" in the last 72 hours. Urine analysis:    Component Value Date/Time   COLORURINE STRAW (A) 07/04/2022 2300   APPEARANCEUR CLEAR 07/04/2022 2300   LABSPEC 1.008 07/04/2022 2300   PHURINE 6.0 07/04/2022 2300   GLUCOSEU NEGATIVE 07/04/2022 2300   HGBUR NEGATIVE 07/04/2022 2300   BILIRUBINUR NEGATIVE 07/04/2022 2300   KETONESUR NEGATIVE 07/04/2022 2300   PROTEINUR NEGATIVE 07/04/2022 2300   UROBILINOGEN 0.2 12/08/2010 0545   NITRITE NEGATIVE 07/04/2022 2300   LEUKOCYTESUR NEGATIVE 07/04/2022 2300    Radiological Exams on Admission: DG Chest Portable 1 View  Result Date: 09/17/2022 CLINICAL DATA:  sob, cough EXAM: PORTABLE CHEST 1 VIEW COMPARISON:  July 29, 2022 FINDINGS: The cardiomediastinal silhouette is unchanged and enlarged in contour.Perihilar vascular fullness with mild peribronchial cuffing. Retrocardiac opacity, likely atelectasis. No pleural effusion. No pneumothorax. IMPRESSION: Constellation of findings are favored to reflect mild pulmonary edema with LEFT basilar atelectasis. Electronically Signed   By: Valentino Saxon M.D.   On: 09/17/2022 17:26      Assessment/Plan   Principal Problem:   Acute on chronic respiratory failure with hypoxia (HCC) Active Problems:   Acute on chronic diastolic CHF (congestive heart failure) (HCC)   COPD with acute exacerbation (HCC)   Acquired hypothyroidism   DM2 (diabetes mellitus, type 2) (HCC)   Moderate aortic stenosis   Paroxysmal atrial fibrillation (HCC)   Chronic iron deficiency anemia      #) Acute on  chronic hypoxic respiratory failure: In the setting of 1 week of progressive shortness of breath associated with onset,, she was noted to be hypoxic on her baseline 4 L continuous nasal cannula, with persistence of this hypoxia after increased to 6 L nasal cannula, before instituting oxygen saturations in the high 90s were obtained with 15 L nonrebreather.  Suspect that this is multifactorial in its etiology, with suspected contribution from acute on chronic diastolic heart failure given doubling of will BNP in the last 6 weeks as well as chest x-ray showing interval development of pulmonary edema.  Additionally, suspect a secondary contribution from acute COPD exacerbation, with potential notable complicating factors that include history of sleep apnea/obesity hypoventilation syndrome in the context of patient's reported inability to tolerate recommended nightly BiPAP.  Will pursue further evaluation management of acute on chronic diastolic heart failure as well as suspected acute COPD exacerbation, will closely monitoring considering blood gas for evidence of development acute hypercapnia.  At this time, the patient is awake alert oriented.   Injuries of other considered contributions, ACS approximately, in the absence of any recent chest pain, with nonelevated high-sensitivity troponin, likely reassuring given duration of the patient's shortness of breath over the course of the last 1 week, in addition to EKG showed no evidence of acute ischemic changes, including evidence of ST elevation.  Acute pulmonary embolism is also felt to be less likely only the context of the patient's anticoagulation on Eliquis will bolus including recent VQ scan was not suggestive of acute  PE.   She notes that her new onset cough over the course of the last week has been productive in nature, however, no infiltrate identified on chest x-ray.  Will add on procalcitonin level to further assess.  COVID, influenza, RSV PCR are all  negative.  Additionally, in the setting of recent hospitalization for concern regarding acute upper gastrointestinal bleed, will closely monitor ensuing hemoglobin trend which is 1.5 points lower this evening relative to most recent prior value.  While this is most likely hemodilutional in the setting of acute on chronic diastolic heart failure, will closely monitor ensuing Hgb trend for potential concretions towards further exacerbation of her acute on chronic hypoxic respiratory failure due to associated decline in oxygen delivery capacity as well as associated increased risk for acute heart failure exacerbation.  Clinically, no evidence of acute GI bleed at this time.   Plan: Monitor continuous pulse oximetry.  Further evaluation management of acute on chronic diastolic heart failure, including additional IV Lasix as further detailed below.  Further evaluation management of suspected acute COPD exacerbation, including scheduled to nebulizer treatments, prn albuterol nebulizers, Solu-Medrol.  Procalcitonin level.  Repeat CMP/CBC in the morning.  Consents from a tree.  Flutter valve.  Check serum phosphorus level.  Will attempt to optimize her magnesium level with 2 g IV every 2 hours x 1 dose now, with repeat serum magnesium level ordered for the morning.             #) Acute on chronic diastolic heart failure: Suspected diagnosis in setting of previously described (shortness of breath, worsening peripheral edema, doubling of BNP over the last 6 weeks, with interval development of pulmonary edema on chest x-ray over that timeframe.  Relative to known history of chronic diastolic heart failure, most recent echocardiogram October 2023 with results as described above, including LVEF 66% as well as grade 2 diastolic dysfunction as well as moderate/borderline severe aortic stenosis with aortic valve area by VTI 1.16 cm2.   she warrants additional IV diuresis.  However, her volume/hemodynamic status  is complicated by the potential preload and afterload dependent nature of her moderate to severe aortic stenosis, which may also be providing a contributory role towards her acute heart failure exacerbation.  Notably, I will formally consult cardiology for their additional recommendations.  No evidence to suggest ACS at this time, as further detailed above, and there do not appear to be any issues with compliance regarding her outpatient diuretic regimen consisting of torsemide 40 mg p.o. daily as well as spironolactone.  There are some doses of torsemide 40 g p.o. daily would be the equivalent of Lasix 80 mg IV daily.  Given concerns over potential preload dependent features of her aortic stenosis, will start with Lasix 80 mg IV twice daily, and follow closely for ensuing recs from cardiology consultation.  Of note, she has received Lasix 80 mg IV x 1 in the ED this evening.  Given anticipated decline in serum potassium level notably from IV diuresis but also from intracellular shunt from plan for additional beta-2 agonists, will initiate potassium chloride supplementation as below.     Plan: Monitor strict I's and O's and daily weights.  Lasix 80 mg IV twice daily.  Potassium chloride 20 g p.o. x 1 dose now.  Magnesium sulfate 2 g IV every 2 hours, with repeats her magnesium level ordered for the morning.  Repeat CMP/CBC in the morning.  Requested formal cardiology consult for the morning of 09/18/2022.  Holding home torsemide  and spironolactone for now.  Monitor telemetry.  Add on urine studies.  Check TSH.            #) Acute COPD exacerbation: in the context of a documented history of COPD, suspect element of acute COPD exacerbation, as further detailed above.  At heightened risk for ensued acute hypercapnia given underlying obesity hypoventilation syndrome as well as obstructive sleep apnea.  Will closely monitor ensuing blood gas results.  In terms of etiologies leading to her acute COPD  exacerbation, suspected physiologic stress from primary pathology of acute on chronic diastolic heart failure, as above, we will also pursue further infectious workup noting interval development of productive cough, including checking a procalcitonin level.   Plan: monitor continuous pulse oxymetry. Monitor on telemetry. Solumedrol. Scheduled duonebs q6 hours. Prn albuterol inhaler.  CMP in the morning. Repeat CBC in the morning.  Magnesium supplementation, as above, with repeat serum Mg in the AM.  Check Phos levels. Will attempt additional chart review to evaluate most recent PFT results. Will start Azithromycin  for benefit of shortened duration of hospitalization associated with antibiotic initiation in the setting of acute COPD exacerbation, noting no evidence of QT prolongation on presenting EKG. Check blood gas. Add-on procalcitonin.  Flutter valve.  Incentive spirometer.              #) acquired hypothyroidism: documented h/o such, on Synthroid as outpatient.   Plan: cont home Synthroid.  Check TSH.               #) Paroxysmal atrial fibrillation: Documented history of such. In setting of CHA2DS2-VASc score of 5, there is an indication for chronic anticoagulation for thromboembolic prophylaxis. Consistent with this, patient is chronically anticoagulated on Eliquis. Home AV nodal blocking regimen: None.  Most recent echocardiogram at the end of October 2023, with findings that included severely dilated left atrium as well as trivial mitral regurgitation/aortic regurgitation, with additional findings as conveyed above.. Presenting EKG sinus rhythm without overt evidence of acute schema changes.     Plan: monitor strict I's & O's and daily weights. CBC in AM.  Magnesium supplementation, with repeat serum magnesium level in the morning, as further detailed above.  Potassium chloride 20 mill colons p.o. x 1 dose now with repeat CMP in the morning.  Continue home Eliquis.   Monitor on telemetry.             #) Generalized anxiety disorder: documented h/o such. On prn Xanax as outpatient.    Plan: As needed IV Ativan for anxiety.  Hold home prn Xanax for now.            #) Chronic iron deficiency anemia: Documented history of such, a/w with baseline hgb range 8-10, with presenting hgb consistent with this range, with suspected hemodilutional contribution towards interval decrease by 1.5 points relative to most recent prior hemoglobin level in setting of acute on chronic diastolic repair.  During a recent hospitalization, was evaluated for potential acute upper GI bleed, with EGD demonstrating no evidence of contributory pathology.  Patient reports interval resolution of previously noted melena, and no evidence to suggest active bleeding at this time.   Plan: Repeat CBC in the morning.  Add on iron studies.  Type and screen.           #) Type 2 Diabetes Mellitus: documented history of such. Home insulin regimen: Sliding scale NovoLog 3 times daily with meals.  Does not appear to be on any basal insulin as  outpatient.. Home oral hypoglycemic agents: None. presenting blood sugar: 193, with increased risk for insulin relative worsening of hyperglycemia as a consequence of plan for additional solumedrol as a component of management for her acute COPD exacerbation. Most recent A1c noted to be 5.2% in October 2023.  This is complicated by a documented history of diabetic peripheral polyneuropathy, for which she is on gabapentin as an outpatient.  Plan: accuchecks QAC and HS with low dose SSI.  Resume home gabapentin.        DVT prophylaxis: SCD's + home eliquis  Code Status: Full code Family Communication: none Disposition Plan: Per Rounding Team Consults called: I have requested formal cardiology consult to occur on the morning of 12/19. ;  Admission status: inpatient     I SPENT GREATER THAN 75  MINUTES IN CLINICAL CARE TIME/MEDICAL  DECISION-MAKING IN COMPLETING THIS ADMISSION.      [place] DO [facility]  From [place]   09/17/2022, 9:23 PM

## 2022-09-17 NOTE — ED Provider Notes (Signed)
Emergency Department Provider Note    Medical Decision Making  This is a 56 y.o. female who presents to the emergency department with dyspnea and cough. History is obtained from patient and EMS. Medical history reviewed in EMR.  Past medical/surgical history that increases complexity of ED encounter:  aortic stenosis, asthma, COPD,  CAD, HFpEF (55-60%), GERD, HCV, HLD, HTN, hypothyroidism, migraines, OSA on BiPAP, T2DM,  Initial Assessment:   With the patient's presentation of worsening cough with productive sputum (more and different from baseline) with new O2 requirement/hypoxia from baseline and wheezing in the upper lung fields, most likely diagnosis is COPD exacerbation. Other diagnoses were considered including (but not limited to) CHF exacerbation (there is some peripheral edema, but mild), ACS/MI, pneumonia, covid/flu. PE considered due to hypoxia, but not tachycardic, no history of DVT/PE, wells score is 0, low suspicion at this time.  Initial Plan: Lab eval with CBC, CMP, VBG, BNP, troponin x2, covid/flu Imaging eval with CXR ECG Duoneb, IV methyprednisolone 125 mg Azithromycin IV 500 mg   Interpretation of Diagnostics:  I personally reviewed the ECG, CXR, labs and my interpretation is as follows:  ECG shows a normal sinus rate and rhythm without any signs of acute ischemia. There are some nonspecific ST changes which are the same compared to prior. VBG shows no signs of respiratory acidosis, no indication for BiPAP at this time. BNP slightly elevated, nearly doubled from a month ago. I anticipate that she is falsely low on her BNP because of her significant obesity. CBC with anemia to 8.4, compared to around 10 one month ago. She denies any bleeding or melena. Per chart review, she had a thorough GI workup for anemia and melena, including a negative EGD. CXR performed which shows signs of pulmonary edema. I believe this is a mixed picture of CHF exacerbation and COPD exacerbation.  She was given 80 mg IV lasix, duonebs, methylprednisolone, and azithromycin, and admitted to the internal medicine team.  1. Acute cough   2. Shortness of breath   3. COPD exacerbation ([place])   4. Acute on chronic diastolic congestive heart failure ([place])    The plan for this patient was discussed with Dr. Kathrynn Humble, who voiced agreement and who oversaw evaluation and treatment of this patient.     Clinical Complexity  A medically appropriate history, review of systems, and physical exam was performed.   Collateral history obtained from: EMS  Patient's presentation is most consistent with severe threat to life/limb.  Discussed patient's care with providers from the following different specialties: internal medicine       History and Review of Systems  Chief Complaint:  Shortness of Breath and Cough    Shortness of Breath Associated symptoms: cough   Cough Associated symptoms: shortness of breath     Chelsea [name] is a 56 y.o. year old female with significant PMH for aortic stenosis, asthma, COPD,  CAD, HFpEF (55-60%), GERD, HCV, HLD, HTN, hypothyroidism, migraines, OSA on BiPAP, T2DM, who presents with a chief complaint of shortness of breath and cough. For the past week has had progressively worsening dyspnea with cough productive of sputum which is greenish and different than any normal sputum for her. She feels generalized malaise, but no fever. Is on 4 L [place] at a facility. At the facility had low spo2 into the 60s. Was titrated up to 6L [place] and satting 89% with EMS. She denies any chest pain, only sob and cough.     Past Medical History:  Diagnosis Date   Aortic stenosis    Echocardiogram 02/21/11:  Mean gradient 23 mm of mercury; peak gradient 36;; Turbulence noted in the area of the aortic isthmus with peak gradient 23 mmHg-consider MRI to assess for coarctation;    TEE: 04/02/11:  EF 55-60%, mod BAE, trileaflet AV with mild AS (pk and mean 25 and 16), mild AI, mild  MR, mild TR, atrial septal aneurysm, no evidence of corarctation   Arthritis    "qwhere" (12/05/2017)   Asthma    Cervical cancer ([place]) 2006   CHF (congestive heart failure) ([facility])    Chronic lower back pain    Complication of anesthesia    "I have a hard time waking up from under it" (12/05/2017)   COPD (chronic obstructive pulmonary disease) ([place])    "lung dr said I don't have this" (12/05/2017)   Coronary artery disease    Diastolic congestive heart failure ([place])    Echo 02/21/11: EF 55-60%, moderate LVH, grade 2 diastolic dysfunction, mild to moderate aortic stenosis with a mean gradient 23 mm of mercury   GERD (gastroesophageal reflux disease)    Heart murmur    Hepatitis C    History of gout X 1   Hyperlipidemia    Hypertension    Hypotension arterial 04/07/2022   Hypothyroid    IBS (irritable bowel syndrome)    Migraine    "monthly" (12/05/2017)   Obesity hypoventilation syndrome ([place])    On home oxygen therapy    "2L all the time" (12/05/2017)   OSA treated with BiPAP    "have CPAP at home too; wearing BiPAP right now" (12/05/2017)   Pneumonia    "several times" (12/05/2017)   Type II diabetes mellitus ([place])     Past Surgical History:  Procedure Laterality Date   ABDOMINAL SURGERY  2006 X 2   "TAH incision was infected"   BIOPSY  07/21/2022   Procedure: BIOPSY;  Surgeon: Otis Brace, MD;  Location: [facility];  Service: Gastroenterology;;   CHOLECYSTECTOMY OPEN     ESOPHAGOGASTRODUODENOSCOPY N/A 07/21/2022   Procedure: ESOPHAGOGASTRODUODENOSCOPY (EGD);  Surgeon: Otis Brace, MD;  Location: [facility];  Service: Gastroenterology;  Laterality: N/A;   TONSILLECTOMY     TOTAL ABDOMINAL HYSTERECTOMY  2006   TRACHEOSTOMY REVISION N/A 04/09/2022   Procedure: CONTROL OF BLEEDING,TRACHEOSTOMY SITE;  Surgeon: Melida Quitter, MD;  Location: [facility];  Service: ENT;  Laterality: N/A;    Family History  Problem Relation Age of Onset   Heart disease Father     Social History    Socioeconomic History   Marital status: Significant Other    Spouse name: Not on file   Number of children: Not on file   Years of education: Not on file   Highest education level: Not on file  Occupational History   Occupation: disabled  Tobacco Use   Smoking status: Never   Smokeless tobacco: Never  Vaping Use   Vaping Use: Never used  Substance and Sexual Activity   Alcohol use: Not Currently   Drug use: No   Sexual activity: Not Currently  Other Topics Concern   Not on file  Social History Narrative   Not on file   Social Determinants of Health   Financial Resource Strain: Low Risk  (07/05/2022)   Overall Financial Resource Strain (CARDIA)    Difficulty of Paying Living Expenses: Not very hard  Food Insecurity: No Food Insecurity (07/19/2022)   Hunger Vital Sign    Worried About Running Out  of Food in the Last Year: Never true    [place] in the Last Year: Never true  Transportation Needs: No Transportation Needs (07/19/2022)   PRAPARE - Hydrologist (Medical): No    Lack of Transportation (Non-Medical): No  Physical Activity: Not on file  Stress: Not on file  Social Connections: Not on file  Intimate Partner Violence: Not At Risk (07/19/2022)   Humiliation, Afraid, Rape, and Kick questionnaire    Fear of Current or Ex-Partner: No    Emotionally Abused: No    Physically Abused: No    Sexually Abused: No       Review of Systems  Respiratory:  Positive for cough and shortness of breath.      Negative except as per HPI      Physical Exam  Vitals:   09/18/22 0033 09/18/22 0042 09/18/22 0045 09/18/22 0046  BP:      Pulse: 88 84 83 81  Resp: '20 16 20 19  '$ Temp:      TempSrc:      SpO2: 96% 93% 92% 90%  Weight:      Height:         Physical Exam Vitals and nursing note reviewed.  Constitutional:      General: She is not in acute distress.    Appearance: She is well-developed. She is obese. She is not  ill-appearing or diaphoretic.  HENT:     Head: Normocephalic and atraumatic.  Eyes:     Conjunctiva/sclera: Conjunctivae normal.  Cardiovascular:     Rate and Rhythm: Normal rate and regular rhythm.     Heart sounds: No murmur heard. Pulmonary:     Effort: Pulmonary effort is normal. No accessory muscle usage or respiratory distress.     Breath sounds: Examination of the right-upper field reveals wheezing. Examination of the left-upper field reveals wheezing. Examination of the right-lower field reveals rales. Examination of the left-lower field reveals rales. Wheezing and rales present.  Abdominal:     Palpations: Abdomen is soft.     Tenderness: There is no abdominal tenderness.  Musculoskeletal:        General: No swelling.     Cervical back: Neck supple.  Skin:    General: Skin is warm and dry.     Capillary Refill: Capillary refill takes less than 2 seconds.  Neurological:     Mental Status: She is alert.  Psychiatric:        Mood and Affect: Mood normal.       Procedure Note  Procedures      Phyllis Ginger, MD 09/18/22 [id number]    Varney Biles, MD 09/18/22 1342

## 2022-09-17 NOTE — ED Triage Notes (Signed)
Pt arrives via EMS from [name]. Staff reported low spo2 readings in the 60s. Pt wears 5lnc at baseline. Pt reports worsening sob over past 5 days and cough that started today. Pt denies cp or other associated symptoms. PT is AxOx4. Current o2 is 89% on 6lnc.

## 2022-09-17 NOTE — ED Notes (Signed)
Pt o2 dropped to 84% while on [place], patient placed on NRB per provider

## 2022-09-17 NOTE — H&P (Incomplete)
History and Physical      ANIYA JOLICOEUR HKV:[id number] DOB: March 13, 1966 DOA: 09/17/2022  PCP: Pcp, No (will further assess) Patient coming from: SNF  I have personally briefly reviewed patient's old medical records in [place]  Chief Complaint: sob  HPI: GEOFFREY MANKIN is a 56 y.o. female with medical history significant for chronic hypoxic respiratory failure on 4 L continuous nasal cannula, chronic diastolic heart failure, COPD, obesity hypoventilation syndrome, obstructive sleep apnea unable to tolerate recommended BiPAP at night, moderate aortic stenosis, paroxysmal atrial fibrillation chronically anticoagulated on Eliquis, who is admitted to [facility] on 09/17/2022 with acute on chronic hypoxic respiratory failure due to acute on chronic diastolic heart failure as well as suspected contribution from COPD exacerbation,  after presenting from SNF to [facility] ED complaining of shortness of breath.   The patient reports 1 week of progressive shortness of breath associated with new onset cough, that has been mildly productive in the absence of any hemoptysis.  There that have requested she also notes slight worsening of her edema in the bilateral lower extremities.  Denies any associated subjective fever, chills, rigors, generalized myalgias.  No associated any chest pain, palpitations, diaphoresis, nausea, vomiting, dizziness dizziness, presyncope, or syncope.  Denies any new onset calf tenderness or new lower extremity erythema, and confirms that she is chronically anticoagulated on Eliquis in the setting of paroxysmal atrial fibrillation.  Additionally, it is noted that she underwent VQ scan on 07/30/2022, which showed no evidence of acute pulmonary embolism.  She has history of chronic diastolic heart failure as well as moderate aortic stenosis.  Most recent echocardiogram occurred on 07/30/2022 was notable for LVEF 60 to 65%, no focal localization-related, grade 2  diastolic dysfunction, normal right ventricular systolic function, severely dilated left atrium, trivial mitral regurgitation/aortic regurgitation as well as moderate aortic stenosis with aortic valve area by VTI of 1.16 cm2.  She has good compliance with home torsemide 40 mg p.o. daily and is also on spironolactone as an outpatient.  She also carries minimal pulmonary diagnoses, including COPD, obstructive sleep apnea as well as obesity hypoventilation syndrome.  She is on chronic 4 L continuous nasal cannula as her baseline, and notes that she is unable to tolerate the BiPAP that is recommended for her on a nightly basis.  Of note, during a recent hospitalization for evaluation of melena, she underwent EGD, which reportedly showed no evidence of acute upper GI bleeding source.  She notes interval resolution of melena since that time, denies any recent hematochezia.  No recent abdominal pain, nausea, vomiting, hematemesis.  In the setting of her progressive shortness of breath, EMS was called to SNF facility, at which time she was noted to be hypoxic on her baseline 4 L continuous nasal cannula, prompting EMS to increase to 6 L/min upon which she remained hypoxic with oxygen saturations in the mid 80s, she was socially brought to [place] emergency department for further evaluation and management, at which time she was transitioned to nonrebreather, as further detailed below     ED Course:  Vital signs in the ED were notable for the following: Temperature max 99.3; heart rate 56-43; systolic blood pressures in the low 100s to 1 teens; respiratory 16-22, 84 to 89% on 6 L nasal cannula, says improving to 97% on 15 L nonrebreather.  Labs were notable for the following: CMP notable for sodium 139, HCO3 44 compared to 35 on 08/03/2022, BUN 17 compared to 19 on 08/03/2022, creatinine 0.80,  calcium, adjusted for hypoalbuminemia noted to be 9.3, albumin 3.0, otherwise liver enzymes within normal limits.  BNP 143  compared to most recent prior value 64.3 on 07/29/2022.  High sensitive troponin I x 1 was found to be 5.  CBC notable for white cell count 6000, hemoglobin associated with normocytic finding and relative to his recent prior hemoglobin value of 10.1 on 08/01/2022.  COVID, influenza, RSV PCR all negative.  Per my interpretation, EKG in ED demonstrated the following: Relative to most recent EKG performed on 08/01/2022, today's EKG shows sinus rhythm with heart rate 86, normal intervals, nonspecific T wave inversion in V1 through V3, which was unchanged from most recent prior EKG and she has no evidence of ST changes, including evidence of ST elevation.  Imaging and additional notable ED work-up: Chest x-ray, in comparison to 1 view chest x-ray on 07/29/2022 and per formal radiology report shows interval development of mild pulmonary edema as well as left basilar atelectasis in the absence of overt infiltrate, pleural effusion, or pneumothorax.  While in the ED, the following were administered: Lasix 80 mg IV developed, no nebulization, solumedrol milligrams IV x 1, Percocet 5/325 mg p.o. x 1, azithromycin.  Subsequently, the patient was admitted for further evaluation and management of acute on chronic hypoxic respiratory failure in the setting of acute on chronic diastolic heart failure as well as suspected contribution from acute COPD exacerbation.      Review of Systems: As per HPI otherwise 10 point review of systems negative.   Past Medical History:  Diagnosis Date  . Aortic stenosis    Echocardiogram 02/21/11:  Mean gradient 23 mm of mercury; peak gradient 36;; Turbulence noted in the area of the aortic isthmus with peak gradient 23 mmHg-consider MRI to assess for coarctation;    TEE: 04/02/11:  EF 55-60%, mod BAE, trileaflet AV with mild AS (pk and mean 25 and 16), mild AI, mild MR, mild TR, atrial septal aneurysm, no evidence of corarctation  . Arthritis    "qwhere" (12/05/2017)  . Asthma   .  Cervical cancer ([place]) 2006  . CHF (congestive heart failure) ([place])   . Chronic lower back pain   . Complication of anesthesia    "I have a hard time waking up from under it" (12/05/2017)  . COPD (chronic obstructive pulmonary disease) ()    "lung dr said I don't have this" (12/05/2017)  . Coronary artery disease   . Diastolic congestive heart failure ([place])    Echo 02/21/11: EF 55-60%, moderate LVH, grade 2 diastolic dysfunction, mild to moderate aortic stenosis with a mean gradient 23 mm of mercury  . GERD (gastroesophageal reflux disease)   . Heart murmur   . Hepatitis C   . History of gout X 1  . Hyperlipidemia   . Hypertension   . Hypotension arterial 04/07/2022  . Hypothyroid   . IBS (irritable bowel syndrome)   . Migraine    "monthly" (12/05/2017)  . Obesity hypoventilation syndrome ([place])   . On home oxygen therapy    "2L all the time" (12/05/2017)  . OSA treated with BiPAP    "have CPAP at home too; wearing BiPAP right now" (12/05/2017)  . Pneumonia    "several times" (12/05/2017)  . Type II diabetes mellitus ([place])     Past Surgical History:  Procedure Laterality Date  . ABDOMINAL SURGERY  2006 X 2   "TAH incision was infected"  . BIOPSY  07/21/2022   Procedure: BIOPSY;  Surgeon: Alessandra Bevels,  Orson Gear, MD;  Location: [facility];  Service: Gastroenterology;;  . CHOLECYSTECTOMY OPEN    . ESOPHAGOGASTRODUODENOSCOPY N/A 07/21/2022   Procedure: ESOPHAGOGASTRODUODENOSCOPY (EGD);  Surgeon: Otis Brace, MD;  Location: [facility];  Service: Gastroenterology;  Laterality: N/A;  . TONSILLECTOMY    . TOTAL ABDOMINAL HYSTERECTOMY  2006  . TRACHEOSTOMY REVISION N/A 04/09/2022   Procedure: CONTROL OF BLEEDING,TRACHEOSTOMY SITE;  Surgeon: Melida Quitter, MD;  Location: [facility];  Service: ENT;  Laterality: N/A;    Social History:  reports that she has never smoked. She has never used smokeless tobacco. She reports that she does not currently use alcohol. She reports that she does not use  drugs.   Allergies  Allergen Reactions  . Iodinated Contrast Media Anaphylaxis  . Penicillins Anaphylaxis    Patient tolerates cefepime (08/2017)  . Insulin Lispro Other (See Comments)    Adder per [facility] from SNF  . Minoxidil     Other reaction(s): hives    Family History  Problem Relation Age of Onset  . Heart disease Father     Family history reviewed and not pertinent    Prior to Admission medications   Medication Sig Start Date End Date Taking? Authorizing Provider  acetaminophen (TYLENOL) 325 MG tablet Take 650 mg by mouth every 6 (six) hours as needed for mild pain.    [provider]  albuterol (PROVENTIL) (2.5 MG/3ML) 0.083% nebulizer solution Take 3 mLs (2.5 mg total) by nebulization every 6 (six) hours as needed for wheezing or shortness of breath (I50.32). 07/04/16   [name], MD  ALPRAZolam Duanne Moron) 0.5 MG tablet Take 1 tablet (0.5 mg total) by mouth every 8 (eight) hours as needed for anxiety. 08/03/22   Arrien, Jimmy Picket, MD  ammonium lactate (LAC-HYDRIN) 12 % lotion Apply 1 Application topically every evening. Apply to  legs and feet every evening shift    [provider]  budesonide (PULMICORT) 0.5 MG/2ML nebulizer solution Inhale 0.5 mg into the lungs every 12 (twelve) hours.    [provider]  Cholecalciferol 125 MCG (5000 UT) TABS Take 1 tablet (5,000 Units total) by mouth daily. 07/10/22   [name], [name], MD  cyanocobalamin (VITAMIN B12) 1000 MCG tablet Take 1 tablet (1,000 mcg total) by mouth daily. 07/10/22   [name], [name], MD  diclofenac Sodium (VOLTAREN) 1 % GEL Apply 4 g topically every 6 (six) hours as needed (pain).    [provider]  ELIQUIS 5 MG TABS tablet Take 5 mg by mouth 2 (two) times daily. 04/12/21   [provider]  ferrous sulfate 324 MG TBEC Take 324 mg by mouth daily.    [provider]  folic acid (FOLVITE) 1 MG tablet Take 1 tablet (1 mg total) by mouth daily. 07/10/22    [name], [name], MD  gabapentin (NEURONTIN) 400 MG capsule Take 400 mg by mouth 3 (three) times daily.    [provider]  HYDROcodone-acetaminophen (NORCO/VICODIN) 5-325 MG tablet Take 1 tablet by mouth every 6 (six) hours as needed for severe pain (for back and shoulder pain). 08/03/22   Arrien, Jimmy Picket, MD  insulin aspart (NOVOLOG) 100 UNIT/ML injection insulin aspart (novoLOG) injection 0-24 Units 0-24 Units, Subcutaneous, 3 times daily with meals, CBG < 70: Implement Hypoglycemia measures, call MD CBG < 120 (dose in units): 0 CBG 120 - 160 (dose in units): 2 CBG 161 - 200 (dose in units): 4 CBG 201 - 250 (dose in units): 8 CBG 251 - 300 (  dose in units): 12 CBG 301 - 350 (dose in units): 16 CBG 351 - 450 (dose in units): 20 CBG > 450: 24 units and obtain STAT glucose and notify MD 07/10/22   [name], MD  levothyroxine (SYNTHROID) 150 MCG tablet Take 1 tablet (150 mcg total) by mouth daily at 6 (six) AM. 07/10/22   [name], [name], MD  Menthol, Topical Analgesic, (BIOFREEZE EX) Apply 1 application  topically as needed (fo pain).    [provider]  pantoprazole (PROTONIX) 40 MG tablet Take 1 tablet (40 mg total) by mouth daily. 07/23/22   Mercy Riding, MD  polyethylene glycol (MIRALAX / GLYCOLAX) 17 g packet Take 17 g by mouth daily. 07/10/22   [name], [name], MD  potassium chloride SA (KLOR-CON M) 20 MEQ tablet Take 1 tablet (20 mEq total) by mouth daily. 07/10/22   [name], [name], MD  senna-docusate (SENOKOT-S) 8.6-50 MG tablet Take 2 tablets by mouth 2 (two) times daily. Patient taking differently: Take 1 tablet by mouth 2 (two) times daily. 07/10/22   [name], [name], MD  spironolactone (ALDACTONE) 25 MG tablet Take 0.5 tablets (12.5 mg total) by mouth daily. 08/04/22 09/03/22  Arrien, Jimmy Picket, MD  torsemide (DEMADEX) 20 MG tablet Take 40 mg by mouth daily.    [provider]     Objective    Physical  Exam: Vitals:   09/17/22 1945 09/17/22 1949 09/17/22 1957 09/17/22 2030  BP: (!) 100/57 (!) 100/57  116/61  Pulse: 85 82  90  Resp: (!) 21 (!) 21  20  Temp:  98.3 F (36.8 C)    TempSrc:  Oral    SpO2: 97% 97% 97% 95%  Weight:      Height:        General: appears to be stated age; alert, oriented; mildly increased work of breathing noted Skin: warm, dry, no rash Head:  AT/[place] Mouth:  Oral mucosa membranes appear moist, normal dentition Neck: supple; trachea midline Heart:  RRR; did not appreciate any M/R/G Lungs: CTAB, did not appreciate any wheezes, rales, or rhonchi Abdomen: + BS; soft, ND, NT Vascular: 2+ pedal pulses b/l; 2+ radial pulses b/l Extremities: Trace edema in the bilateral lower extremities, no muscle wasting Neuro: strength and sensation intact in upper and lower extremities b/l   Labs on Admission: I have personally reviewed following labs and imaging studies  CBC: Recent Labs  Lab 09/17/22 1705 09/17/22 1939  WBC 6.0  --   NEUTROABS 3.9  --   HGB 8.4* 9.9*  HCT 29.3* 29.0*  MCV 91.6  --   PLT 236  --    Basic Metabolic Panel: Recent Labs  Lab 09/17/22 1705 09/17/22 1939  NA 139 138  K 4.0 4.2  CL 85*  --   CO2 44*  --   GLUCOSE 193*  --   BUN 17  --   CREATININE 0.80  --   CALCIUM 8.5*  --    GFR: Estimated Creatinine Clearance: 109.3 mL/min (by C-G formula based on SCr of 0.8 mg/dL). Liver Function Tests: Recent Labs  Lab 09/17/22 1705  AST 12*  ALT 7  ALKPHOS 80  BILITOT 0.5  PROT 6.7  ALBUMIN 3.0*   No results for input(s): "LIPASE", "AMYLASE" in the last 168 hours. No results for input(s): "AMMONIA" in the last 168 hours. Coagulation Profile: No results for input(s): "INR", "PROTIME" in the last 168 hours. Cardiac Enzymes: No results for input(s): "CKTOTAL", "CKMB", "  CKMBINDEX", "TROPONINI" in the last 168 hours. BNP (last 3 results) No results for input(s): "PROBNP" in the last 8760 hours. HbA1C: No results for  input(s): "HGBA1C" in the last 72 hours. CBG: No results for input(s): "GLUCAP" in the last 168 hours. Lipid Profile: No results for input(s): "CHOL", "HDL", "LDLCALC", "TRIG", "CHOLHDL", "LDLDIRECT" in the last 72 hours. Thyroid Function Tests: No results for input(s): "TSH", "T4TOTAL", "FREET4", "T3FREE", "THYROIDAB" in the last 72 hours. Anemia Panel: No results for input(s): "VITAMINB12", "FOLATE", "FERRITIN", "TIBC", "IRON", "RETICCTPCT" in the last 72 hours. Urine analysis:    Component Value Date/Time   COLORURINE STRAW (A) 07/04/2022 2300   APPEARANCEUR CLEAR 07/04/2022 2300   LABSPEC 1.008 07/04/2022 2300   PHURINE 6.0 07/04/2022 2300   GLUCOSEU NEGATIVE 07/04/2022 2300   HGBUR NEGATIVE 07/04/2022 2300   BILIRUBINUR NEGATIVE 07/04/2022 2300   KETONESUR NEGATIVE 07/04/2022 2300   PROTEINUR NEGATIVE 07/04/2022 2300   UROBILINOGEN 0.2 12/08/2010 0545   NITRITE NEGATIVE 07/04/2022 2300   LEUKOCYTESUR NEGATIVE 07/04/2022 2300    Radiological Exams on Admission: DG Chest Portable 1 View  Result Date: 09/17/2022 CLINICAL DATA:  sob, cough EXAM: PORTABLE CHEST 1 VIEW COMPARISON:  July 29, 2022 FINDINGS: The cardiomediastinal silhouette is unchanged and enlarged in contour.Perihilar vascular fullness with mild peribronchial cuffing. Retrocardiac opacity, likely atelectasis. No pleural effusion. No pneumothorax. IMPRESSION: Constellation of findings are favored to reflect mild pulmonary edema with LEFT basilar atelectasis. Electronically Signed   By: Valentino Saxon M.D.   On: 09/17/2022 17:26      Assessment/Plan   Principal Problem:   Acute on chronic respiratory failure with hypoxia (HCC)   ***    #) Acute on chronic hypoxic respiratory failure: In the setting of 1 week of progressive shortness of breath associated with onset,, she was noted to be hypoxic on her baseline 4 L continuous nasal cannula, with persistence of this hypoxia after increased to 6 L nasal  cannula, before instituting oxygen saturations in the high 90s were obtained with 15 L nonrebreather.  Suspect that this is multifactorial in its etiology, with suspected contribution from acute on chronic diastolic heart failure given doubling of will BNP in the last 6 weeks as well as chest x-ray showing interval development of pulmonary edema.  Additionally, suspect a secondary contribution from acute COPD exacerbation, with potential notable complicating factors that include history of sleep apnea/obesity hypoventilation syndrome in the context of patient's reported inability to tolerate recommended nightly BiPAP.  Will pursue further evaluation management of acute on chronic diastolic heart failure as well as suspected acute COPD exacerbation, will closely monitoring considering blood gas for evidence of development acute hypercapnia.  At this time, the patient is awake alert oriented.   Injuries of other considered contributions, ACS approximately, in the absence of any recent chest pain, with nonelevated high-sensitivity troponin, likely reassuring given duration of the patient's shortness of breath over the course of the last 1 week, in addition to EKG showed no evidence of acute ischemic changes, including evidence of ST elevation.  Acute pulmonary embolism is also felt to be less likely only the context of the patient's anticoagulation on Eliquis will bolus including recent VQ scan was not suggestive of acute PE.   She notes that her new onset cough over the course of the last week has been productive in nature, however, no infiltrate identified on chest x-ray.  Will add on procalcitonin level to further assess.  COVID, influenza, RSV PCR are all negative.  Additionally,  in the setting of recent hospitalization for concern regarding acute upper gastrointestinal bleed, will closely monitor ensuing hemoglobin trend which is 1.5 points lower this evening relative to most recent prior value.  While this  is most likely hemodilutional in the setting of acute on chronic diastolic heart failure, will closely monitor ensuing Hgb trend for potential concretions towards further exacerbation of her acute on chronic hypoxic respiratory failure due to associated decline in oxygen delivery capacity as well as associated increased risk for acute heart failure exacerbation.  Clinically, no evidence of acute GI bleed at this time.   Plan: Monitor continuous pulse oximetry.  Further evaluation management of acute on chronic diastolic heart failure, including additional IV Lasix as further detailed below.  Further evaluation management of suspected acute COPD exacerbation, including scheduled to nebulizer treatments, prn albuterol nebulizers, Solu-Medrol.  Procalcitonin level.  Repeat CMP/CBC in the morning.  Consents from a tree.  Flutter valve.  Check serum phosphorus level.  Will attempt to optimize her magnesium level with 2 g IV every 2 hours x 1 dose now, with repeat serum magnesium level ordered for the morning.             #) Acute on chronic diastolic heart failure: Suspected diagnosis in setting of previously described (shortness of breath, worsening peripheral edema, doubling of BNP over the last 6 weeks, with interval development of pulmonary edema on chest x-ray over that timeframe.  Relative to known history of chronic diastolic heart failure, most recent echocardiogram October 2023 with results as described above, including LVEF 66% as well as grade 2 diastolic dysfunction as well as moderate/borderline severe aortic stenosis with aortic valve area by VTI 1.16 cm2.   she warrants additional IV diuresis.  However, her volume/hemodynamic status is complicated by the potential preload and afterload dependent nature of her moderate to severe aortic stenosis, which may also be providing a contributory role towards her acute heart failure exacerbation.  Notably, I will formally consult cardiology for  their additional recommendations.  No evidence to suggest ACS at this time, as further detailed above, and there do not appear to be any issues with compliance regarding her outpatient diuretic regimen consisting of torsemide 40 mg p.o. daily as well as spironolactone.  There are some doses of torsemide 40 g p.o. daily would be the equivalent of Lasix 80 mg IV daily.  Given concerns over potential preload dependent features of her aortic stenosis, will start with Lasix 80 mg IV twice daily, and follow closely for ensuing recs from cardiology consultation.  Of note, she has received Lasix 80 mg IV x 1 in the ED this evening.  Given anticipated decline in serum potassium level notably from IV diuresis but also from intracellular shunt from plan for additional beta-2 agonists, will initiate potassium chloride supplementation as below.     Plan: Monitor strict I's and O's and daily weights.  Lasix 80 mg IV twice daily.  Potassium chloride 20 g p.o. x 1 dose now.  Magnesium sulfate 2 g IV every 2 hours, with repeats her magnesium level ordered for the morning.  Repeat CMP/CBC in the morning.  Requested formal cardiology consult for the morning of 09/18/2022.  Holding home torsemide and spironolactone for now.  Monitor telemetry.  Add on urine studies.  Check TSH.            #) Acute COPD exacerbation: in the context of a documented history of COPD, suspect element of acute COPD exacerbation, as further detailed  above.  At heightened risk for ensued acute hypercapnia given underlying obesity hypoventilation syndrome as well as obstructive sleep apnea.  Will closely monitor ensuing blood gas results.  In terms of etiologies leading to her acute COPD exacerbation, suspected physiologic stress from primary pathology of acute on chronic diastolic heart failure, as above, we will also pursue further infectious workup noting interval development of productive cough, including checking a procalcitonin  level.   Plan: monitor continuous pulse oxymetry. Monitor on telemetry. Solumedrol. Scheduled duonebs q6 hours. Prn albuterol inhaler.  CMP in the morning. Repeat CBC in the morning.  Magnesium supplementation, as above, with repeat serum Mg in the AM.  Check Phos levels. Will attempt additional chart review to evaluate most recent PFT results. Will start Azithromycin  for benefit of shortened duration of hospitalization associated with antibiotic initiation in the setting of acute COPD exacerbation, noting no evidence of QT prolongation on presenting EKG. Check blood gas. Add-on procalcitonin.  Flutter valve.  Incentive spirometer.              #) acquired hypothyroidism: documented h/o such, on Synthroid as outpatient.   Plan: cont home Synthroid.   ***               #) Paroxysmal atrial fibrillation: Documented history of such. In setting of CHA2DS2-VASc score of  ***, there is an indication for chronic anticoagulation for thromboembolic prophylaxis. Consistent with this, patient is chronically anticoagulated on  ***. Home AV nodal blocking regimen: ***.  Most recent echocardiogram  ***. Presenting EKG  ***.   Appears to be in normal sinus rhythm at this time *** *** it appears that a rhythm control strategy is also pursued as an outpatient, with the patient also on ***.    Plan: monitor strict I's & O's and daily weights. CMP/CBC in AM. Check serum mag level. Continue home AV nodal blocking regimen.  ***  ***anticoagulation.  *** tele               *** #) Generalized anxiety disorder: documented h/o such. On  *** as outpatient.  ***  Plan:  ***            #) Chronic iron deficiency anemia: Documented history of such, a/w with baseline hgb range 8-10, with presenting hgb consistent with this range, with suspected hemodilutional contribution towards interval decrease by 1.5 points relative to most recent prior hemoglobin level in setting of  acute on chronic diastolic repair.  During a recent hospitalization, was evaluated for potential acute upper GI bleed, with EGD demonstrating no evidence of contributory pathology.  Patient reports interval resolution of previously noted melena, and no evidence to suggest active bleeding at this time.   Plan: Repeat CBC in the morning.  Add on iron studies.  Type and screen.           #) Type 2 Diabetes Mellitus: documented history of such. Home insulin regimen: Sliding scale NovoLog 3 times daily with meals.  Does not appear to be on any basal insulin as outpatient.. Home oral hypoglycemic agents: None. presenting blood sugar: 193, with increased risk for insulin relative worsening of hyperglycemia as a consequence of plan for additional solumedrol as a component of management for her acute COPD exacerbation. Most recent A1c noted to be 5.2% in October 2023.  This is complicated by a documented history of diabetic peripheral polyneuropathy, for which she is on gabapentin as an outpatient.  Plan: accuchecks QAC and HS with low dose  SSI.  Resume home gabapentin.        DVT prophylaxis: SCD's + home eliquis  Code Status: Full code Family Communication: none Disposition Plan: Per Rounding Team Consults called: I have requested formal cardiology consult to occur on the morning of 12/19. ;  Admission status: inpatient     I SPENT GREATER THAN 75  MINUTES IN CLINICAL CARE TIME/MEDICAL DECISION-MAKING IN COMPLETING THIS ADMISSION.      [place] DO [facility]  From [place]   09/17/2022, 9:23 PM

## 2022-09-18 DIAGNOSIS — D509 Iron deficiency anemia, unspecified: Secondary | ICD-10-CM | POA: Diagnosis present

## 2022-09-18 DIAGNOSIS — E1142 Type 2 diabetes mellitus with diabetic polyneuropathy: Secondary | ICD-10-CM | POA: Diagnosis not present

## 2022-09-18 DIAGNOSIS — I5033 Acute on chronic diastolic (congestive) heart failure: Secondary | ICD-10-CM | POA: Diagnosis not present

## 2022-09-18 DIAGNOSIS — J9621 Acute and chronic respiratory failure with hypoxia: Secondary | ICD-10-CM | POA: Diagnosis not present

## 2022-09-18 DIAGNOSIS — Z794 Long term (current) use of insulin: Secondary | ICD-10-CM | POA: Diagnosis not present

## 2022-09-18 LAB — TYPE AND SCREEN
ABO/RH(D): A POS
Antibody Screen: NEGATIVE

## 2022-09-18 LAB — COMPREHENSIVE METABOLIC PANEL
ALT: 9 U/L (ref 0–44)
AST: 16 U/L (ref 15–41)
Albumin: 3 g/dL — ABNORMAL LOW (ref 3.5–5.0)
Alkaline Phosphatase: 83 U/L (ref 38–126)
Anion gap: 10 (ref 5–15)
BUN: 21 mg/dL — ABNORMAL HIGH (ref 6–20)
CO2: 42 mmol/L — ABNORMAL HIGH (ref 22–32)
Calcium: 8.6 mg/dL — ABNORMAL LOW (ref 8.9–10.3)
Chloride: 86 mmol/L — ABNORMAL LOW (ref 98–111)
Creatinine, Ser: 0.76 mg/dL (ref 0.44–1.00)
GFR, Estimated: >60 mL/min (ref >60)
Glucose, Bld: 259 mg/dL — ABNORMAL HIGH (ref 70–99)
Potassium: 4.2 mmol/L (ref 3.5–5.1)
Sodium: 138 mmol/L (ref 135–145)
Total Bilirubin: 0.6 mg/dL (ref 0.3–1.2)
Total Protein: 7.1 g/dL (ref 6.5–8.1)

## 2022-09-18 LAB — I-STAT VENOUS BLOOD GAS, ED
Acid-Base Excess: 20 mmol/L — ABNORMAL HIGH (ref 0.0–2.0)
Bicarbonate: 49 mmol/L — ABNORMAL HIGH (ref 20.0–28.0)
Calcium, Ion: 1.05 mmol/L — ABNORMAL LOW (ref 1.15–1.40)
HCT: 30 % — ABNORMAL LOW (ref 36.0–46.0)
Hemoglobin: 10.2 g/dL — ABNORMAL LOW (ref 12.0–15.0)
O2 Saturation: 96 %
Potassium: 4.7 mmol/L (ref 3.5–5.1)
Sodium: 137 mmol/L (ref 135–145)
TCO2: >50 mmol/L — ABNORMAL HIGH (ref 22–32)
pCO2, Ven: 82.1 mmHg (ref 44–60)
pH, Ven: 7.384 (ref 7.25–7.43)
pO2, Ven: 89 mmHg — ABNORMAL HIGH (ref 32–45)

## 2022-09-18 LAB — IRON AND TIBC
Iron: 23 ug/dL — ABNORMAL LOW (ref 28–170)
Saturation Ratios: 6 % — ABNORMAL LOW (ref 10.4–31.8)
TIBC: 420 ug/dL (ref 250–450)
UIBC: 397 ug/dL

## 2022-09-18 LAB — CBC WITH DIFFERENTIAL/PLATELET
Abs Immature Granulocytes: 0.06 10*3/uL (ref 0.00–0.07)
Basophils Absolute: 0 10*3/uL (ref 0.0–0.1)
Basophils Relative: 0 %
Eosinophils Absolute: 0 10*3/uL (ref 0.0–0.5)
Eosinophils Relative: 0 %
HCT: 29.1 % — ABNORMAL LOW (ref 36.0–46.0)
Hemoglobin: 8.6 g/dL — ABNORMAL LOW (ref 12.0–15.0)
Immature Granulocytes: 1 %
Lymphocytes Relative: 6 %
Lymphs Abs: 0.5 10*3/uL — ABNORMAL LOW (ref 0.7–4.0)
MCH: 26.8 pg (ref 26.0–34.0)
MCHC: 29.6 g/dL — ABNORMAL LOW (ref 30.0–36.0)
MCV: 90.7 fL (ref 80.0–100.0)
Monocytes Absolute: 0.1 10*3/uL (ref 0.1–1.0)
Monocytes Relative: 1 %
Neutro Abs: 7 10*3/uL (ref 1.7–7.7)
Neutrophils Relative %: 92 %
Platelets: 229 10*3/uL (ref 150–400)
RBC: 3.21 MIL/uL — ABNORMAL LOW (ref 3.87–5.11)
RDW: 15.8 % — ABNORMAL HIGH (ref 11.5–15.5)
WBC: 7.6 10*3/uL (ref 4.0–10.5)
nRBC: 0 % (ref 0.0–0.2)

## 2022-09-18 LAB — TSH: TSH: 3.2 u[IU]/mL (ref 0.350–4.500)

## 2022-09-18 LAB — GLUCOSE, CAPILLARY: Glucose-Capillary: 240 mg/dL — ABNORMAL HIGH (ref 70–99)

## 2022-09-18 LAB — MAGNESIUM: Magnesium: 2 mg/dL (ref 1.7–2.4)

## 2022-09-18 LAB — PHOSPHORUS: Phosphorus: 5.3 mg/dL — ABNORMAL HIGH (ref 2.5–4.6)

## 2022-09-18 LAB — PROCALCITONIN: Procalcitonin: <0.1 ng/mL

## 2022-09-18 LAB — CBG MONITORING, ED
Glucose-Capillary: 253 mg/dL — ABNORMAL HIGH (ref 70–99)
Glucose-Capillary: 233 mg/dL — ABNORMAL HIGH (ref 70–99)
Glucose-Capillary: 254 mg/dL — ABNORMAL HIGH (ref 70–99)

## 2022-09-18 LAB — FERRITIN: Ferritin: 12 ng/mL (ref 11–307)

## 2022-09-18 MED ORDER — DICLOFENAC SODIUM 1 % EX GEL: 4.0000 g | Freq: Four times a day (QID) | CUTANEOUS | Status: DC | PRN

## 2022-09-18 MED ORDER — FOLIC ACID 1 MG PO TABS
1.0000 mg | ORAL_TABLET | Freq: Every day | ORAL | Status: DC
  Administered 2022-09-18 – 2022-10-02 (×15): 1 mg via ORAL
  Filled 2022-09-18 (×15): qty 1

## 2022-09-18 MED ORDER — BUDESONIDE 0.5 MG/2ML IN SUSP
0.5000 mg | Freq: Two times a day (BID) | RESPIRATORY_TRACT | Status: DC
  Filled 2022-09-18 (×2): qty 2

## 2022-09-18 MED ORDER — BUDESONIDE 0.5 MG/2ML IN SUSP
0.5000 mg | Freq: Two times a day (BID) | RESPIRATORY_TRACT | Status: DC
  Administered 2022-09-18 – 2022-10-02 (×28): 0.5 mg via RESPIRATORY_TRACT
  Filled 2022-09-18 (×31): qty 2

## 2022-09-18 MED ORDER — HYDROCODONE-ACETAMINOPHEN 5-325 MG PO TABS
1.0000 | ORAL_TABLET | Freq: Four times a day (QID) | ORAL | Status: DC | PRN
  Administered 2022-09-18 – 2022-10-02 (×38): 1 via ORAL
  Filled 2022-09-18 (×38): qty 1

## 2022-09-18 MED ORDER — PREDNISONE 20 MG PO TABS
40.0000 mg | ORAL_TABLET | Freq: Every day | ORAL | Status: DC
  Administered 2022-09-19 – 2022-09-20 (×2): 40 mg via ORAL
  Filled 2022-09-18 (×2): qty 2

## 2022-09-18 MED ORDER — HYDROCODONE-ACETAMINOPHEN 5-325 MG PO TABS
1.0000 | ORAL_TABLET | Freq: Once | ORAL | Status: AC
  Administered 2022-09-18: 1 via ORAL
  Filled 2022-09-18 (×2): qty 1

## 2022-09-18 MED ORDER — PANTOPRAZOLE SODIUM 40 MG PO TBEC
40.0000 mg | DELAYED_RELEASE_TABLET | Freq: Every day | ORAL | Status: DC
  Administered 2022-09-18 – 2022-10-02 (×15): 40 mg via ORAL
  Filled 2022-09-18 (×15): qty 1

## 2022-09-18 MED ORDER — VITAMIN B-12 1000 MCG PO TABS
1000.0000 ug | ORAL_TABLET | Freq: Every day | ORAL | Status: DC
  Administered 2022-09-18 – 2022-10-02 (×15): 1000 ug via ORAL
  Filled 2022-09-18 (×15): qty 1

## 2022-09-18 NOTE — ED Notes (Signed)
BiPAP removed at pts request, placed on NRB.

## 2022-09-18 NOTE — Progress Notes (Signed)
Patient off BIPAP she wanted a break but understands if she falls back asleep the BIPAP is going back on. Patient on NRB at this time.

## 2022-09-18 NOTE — ED Notes (Signed)
Pt transferred to hospital bed. Changed- sheets soaked. [place] repositioned. RT called to come eval since pt still on nonrebreather.

## 2022-09-18 NOTE — ED Notes (Signed)
Brother Rondel Oh [phone] would like an update asap

## 2022-09-18 NOTE — Consult Note (Signed)
Cardiology Consultation   Patient ID: MEYER DOCKERY MRN: [id number]; DOB: January 03, 1966  Admit date: 09/17/2022 Date of Consult: 09/18/2022  PCP:  Chelsea [name], No   [facility] Providers Cardiologist:  None        Patient Profile:   Chelsea [name] is a 56 y.o. female with a hx of chronic COPD who is being seen 09/18/2022 for the evaluation of shortness of breath with moderate aortic stenosis at the request of Dr. Waldron [name].  History of Present Illness:   Chelsea [name] is a 56 year old female with moderate aortic valve stenosis, 4 L of chronic continuous oxygen via nasal cannula, COPD, obesity hypoventilation syndrome here with shortness of breath.  This has been getting worse over the last several days with cough.  No chest pain.  Recent VQ scan at the end of October showed no PE.  She has been labeled with diastolic heart failure, EF 65%.  She has maintained compliance with her torsemide 40 mg a day, spironolactone as well.  She never did undergo atrial flutter/fibrillation ablation at [facility].  Currently she is resting fairly comfortably in her emergency department bed.  No chest pain.  Past Medical History:  Diagnosis Date   Aortic stenosis    Echocardiogram 02/21/11:  Mean gradient 23 mm of mercury; peak gradient 36;; Turbulence noted in the area of the aortic isthmus with peak gradient 23 mmHg-consider MRI to assess for coarctation;    TEE: 04/02/11:  EF 55-60%, mod BAE, trileaflet AV with mild AS (pk and mean 25 and 16), mild AI, mild MR, mild TR, atrial septal aneurysm, no evidence of corarctation   Arthritis    "qwhere" (12/05/2017)   Asthma    Cervical cancer ([place]) 2006   CHF (congestive heart failure) (HCC)    Chronic lower back pain    Complication of anesthesia    "I have a hard time waking up from under it" (12/05/2017)   COPD (chronic obstructive pulmonary disease) ([place])    "lung dr said I don't have this" (12/05/2017)   Coronary artery disease     Diastolic congestive heart failure ([place])    Echo 02/21/11: EF 55-60%, moderate LVH, grade 2 diastolic dysfunction, mild to moderate aortic stenosis with a mean gradient 23 mm of mercury   GERD (gastroesophageal reflux disease)    Heart murmur    Hepatitis C    History of gout X 1   Hyperlipidemia    Hypertension    Hypotension arterial 04/07/2022   Hypothyroid    IBS (irritable bowel syndrome)    Migraine    "monthly" (12/05/2017)   Obesity hypoventilation syndrome ([place])    On home oxygen therapy    "2L all the time" (12/05/2017)   OSA treated with BiPAP    "have CPAP at home too; wearing BiPAP right now" (12/05/2017)   Pneumonia    "several times" (12/05/2017)   Type II diabetes mellitus ([place])     Past Surgical History:  Procedure Laterality Date   ABDOMINAL SURGERY  2006 X 2   "TAH incision was infected"   BIOPSY  07/21/2022   Procedure: BIOPSY;  Surgeon: Otis Brace, MD;  Location: [facility];  Service: Gastroenterology;;   CHOLECYSTECTOMY OPEN     ESOPHAGOGASTRODUODENOSCOPY N/A 07/21/2022   Procedure: ESOPHAGOGASTRODUODENOSCOPY (EGD);  Surgeon: Otis Brace, MD;  Location: [facility];  Service: Gastroenterology;  Laterality: N/A;   TONSILLECTOMY     TOTAL ABDOMINAL HYSTERECTOMY  2006   TRACHEOSTOMY REVISION N/A 04/09/2022  Procedure: CONTROL OF BLEEDING,TRACHEOSTOMY SITE;  Surgeon: Melida Quitter, MD;  Location: [facility];  Service: ENT;  Laterality: N/A;     Home Medications:  Prior to Admission medications   Medication Sig Start Date End Date Taking? Authorizing Provider  acetaminophen (TYLENOL) 325 MG tablet Take 650 mg by mouth every 6 (six) hours as needed for mild pain.    [provider]  albuterol (PROVENTIL) (2.5 MG/3ML) 0.083% nebulizer solution Take 3 mLs (2.5 mg total) by nebulization every 6 (six) hours as needed for wheezing or shortness of breath (I50.32). 07/04/16   [name], MD  ALPRAZolam Duanne Moron) 0.5 MG tablet Take 1 tablet (0.5 mg total)  by mouth every 8 (eight) hours as needed for anxiety. 08/03/22   Arrien, Jimmy Picket, MD  ammonium lactate (LAC-HYDRIN) 12 % lotion Apply 1 Application topically every evening. Apply to  legs and feet every evening shift    [provider]  budesonide (PULMICORT) 0.5 MG/2ML nebulizer solution Inhale 0.5 mg into the lungs every 12 (twelve) hours.    [provider]  Cholecalciferol 125 MCG (5000 UT) TABS Take 1 tablet (5,000 Units total) by mouth daily. 07/10/22   [name], [name], MD  cyanocobalamin (VITAMIN B12) 1000 MCG tablet Take 1 tablet (1,000 mcg total) by mouth daily. 07/10/22   [name], [name], MD  diclofenac Sodium (VOLTAREN) 1 % GEL Apply 4 g topically every 6 (six) hours as needed (pain).    [provider]  ELIQUIS 5 MG TABS tablet Take 5 mg by mouth 2 (two) times daily. 04/12/21   [provider]  ferrous sulfate 324 MG TBEC Take 324 mg by mouth daily.    [provider]  folic acid (FOLVITE) 1 MG tablet Take 1 tablet (1 mg total) by mouth daily. 07/10/22   [name], [name], MD  gabapentin (NEURONTIN) 400 MG capsule Take 400 mg by mouth 3 (three) times daily.    [provider]  HYDROcodone-acetaminophen (NORCO/VICODIN) 5-325 MG tablet Take 1 tablet by mouth every 6 (six) hours as needed for severe pain (for back and shoulder pain). 08/03/22   Arrien, Jimmy Picket, MD  insulin aspart (NOVOLOG) 100 UNIT/ML injection insulin aspart (novoLOG) injection 0-24 Units 0-24 Units, Subcutaneous, 3 times daily with meals, CBG < 70: Implement Hypoglycemia measures, call MD CBG < 120 (dose in units): 0 CBG 120 - 160 (dose in units): 2 CBG 161 - 200 (dose in units): 4 CBG 201 - 250 (dose in units): 8 CBG 251 - 300 (dose in units): 12 CBG 301 - 350 (dose in units): 16 CBG 351 - 450 (dose in units): 20 CBG > 450: 24 units and obtain STAT glucose and notify MD 07/10/22   [name], MD  levothyroxine (SYNTHROID) 150 MCG  tablet Take 1 tablet (150 mcg total) by mouth daily at 6 (six) AM. 07/10/22   [name], [name], MD  Menthol, Topical Analgesic, (BIOFREEZE EX) Apply 1 application  topically as needed (fo pain).    [provider]  pantoprazole (PROTONIX) 40 MG tablet Take 1 tablet (40 mg total) by mouth daily. 07/23/22   Mercy Riding, MD  polyethylene glycol (MIRALAX / GLYCOLAX) 17 g packet Take 17 g by mouth daily. 07/10/22   [name], [name], MD  potassium chloride SA (KLOR-CON M) 20 MEQ tablet Take 1 tablet (20 mEq total) by mouth daily. 07/10/22   [name], [name], MD  senna-docusate (SENOKOT-S) 8.6-50 MG tablet Take 2 tablets by  mouth 2 (two) times daily. Patient taking differently: Take 1 tablet by mouth 2 (two) times daily. 07/10/22   [name], [name], MD  spironolactone (ALDACTONE) 25 MG tablet Take 0.5 tablets (12.5 mg total) by mouth daily. 08/04/22 09/03/22  Arrien, Jimmy Picket, MD  torsemide (DEMADEX) 20 MG tablet Take 40 mg by mouth daily.    [provider]    Inpatient Medications: Scheduled Meds:  apixaban  5 mg Oral BID   ferrous sulfate  325 mg Oral Q breakfast   furosemide  80 mg Intravenous BID   gabapentin  400 mg Oral TID   insulin aspart  0-9 Units Subcutaneous TID WC   ipratropium-albuterol  3 mL Nebulization Q6H   levothyroxine  150 mcg Oral Q0600   methylPREDNISolone (SOLU-MEDROL) injection  80 mg Intravenous Q12H   senna-docusate  1 tablet Oral BID   Continuous Infusions:  azithromycin     PRN Meds: acetaminophen **OR** acetaminophen, albuterol, diclofenac Sodium, HYDROcodone-acetaminophen, LORazepam, melatonin, polyethylene glycol  Allergies:    Allergies  Allergen Reactions   Iodinated Contrast Media Anaphylaxis   Penicillins Anaphylaxis    Patient tolerates cefepime (08/2017)   Insulin Lispro Other (See Comments)    Adder per MAR from SNF   Minoxidil     Other reaction(s): hives    Social History:   Social History   Socioeconomic  History   Marital status: Significant Other    Spouse name: Not on file   Number of children: Not on file   Years of education: Not on file   Highest education level: Not on file  Occupational History   Occupation: disabled  Tobacco Use   Smoking status: Never   Smokeless tobacco: Never  Vaping Use   Vaping Use: Never used  Substance and Sexual Activity   Alcohol use: Not Currently   Drug use: No   Sexual activity: Not Currently  Other Topics Concern   Not on file  Social History Narrative   Not on file   Social Determinants of Health   Financial Resource Strain: Low Risk  (07/05/2022)   Overall Financial Resource Strain (CARDIA)    Difficulty of Paying Living Expenses: Not very hard  Food Insecurity: No Food Insecurity (07/19/2022)   Hunger Vital Sign    Worried About Running Out of Food in the Last Year: Never true    Ran Out of Food in the Last Year: Never true  Transportation Needs: No Transportation Needs (07/19/2022)   PRAPARE - Hydrologist (Medical): No    Lack of Transportation (Non-Medical): No  Physical Activity: Not on file  Stress: Not on file  Social Connections: Not on file  Intimate Partner Violence: Not At Risk (07/19/2022)   Humiliation, Afraid, Rape, and Kick questionnaire    Fear of Current or Ex-Partner: No    Emotionally Abused: No    Physically Abused: No    Sexually Abused: No    Family History:    Family History  Problem Relation Age of Onset   Heart disease Father      ROS:  Please see the history of present illness.  No fevers chills nausea vomiting syncope bleeding All other ROS reviewed and negative.     Physical Exam/Data:   Vitals:   09/18/22 0800 09/18/22 1000 09/18/22 1100 09/18/22 1200  BP: 97/68 120/81 (!) 131/120 101/71  Pulse: 84 86 80 77  Resp: '18 17  20  '$ Temp:  98.3 F (36.8 C)  TempSrc:      SpO2: 91% 97% 91% 93%  Weight:      Height:        Intake/Output Summary (Last 24  hours) at 09/18/2022 1417 Last data filed at 09/18/2022 1300 Gross per 24 hour  Intake 250 ml  Output 1000 ml  Net -750 ml      09/17/2022    4:51 PM 08/03/2022    5:35 AM 08/02/2022    4:20 AM  Last 3 Weights  Weight (lbs) 309 lb 325 lb 9.9 oz 319 lb 3.6 oz  Weight (kg) 140.161 kg 147.7 kg 144.8 kg     Body mass index is 51.42 kg/m.  General:  Well nourished, well developed, in no acute distress, obese HEENT: normal Neck: no JVD Vascular: No carotid bruits; Distal pulses 2+ bilaterally Cardiac:  normal S1, S2; RRR; no murmur  Lungs:  clear to auscultation bilaterally, no wheezing, rhonchi or rales  Abd: soft, nontender, no hepatomegaly  Ext: no edema Musculoskeletal:  No deformities, BUE and BLE strength normal and equal Skin: warm and dry  Neuro:  CNs 2-12 intact, no focal abnormalities noted Psych:  Normal affect   EKG:  The EKG was personally reviewed and demonstrates: Sinus rhythm 80s with nonspecific ST-T segment changes Telemetry:  Telemetry was personally reviewed and demonstrates: No adverse arrhythmias  Relevant CV Studies: Echocardiogram 07/30/2022:   1. Left ventricular ejection fraction, by estimation, is 60 to 65%. The  left ventricle has normal function. The left ventricle has no regional  wall motion abnormalities. There is mild left ventricular hypertrophy.  Left ventricular diastolic parameters  are consistent with Grade II diastolic dysfunction (pseudonormalization).  Elevated left ventricular end-diastolic pressure.   2. Right ventricular systolic function is normal. The right ventricular  size is mildly enlarged. Tricuspid regurgitation signal is inadequate for  assessing PA pressure.   3. Left atrial size was severely dilated.   4. The mitral valve is degenerative. Trivial mitral valve regurgitation.  No evidence of mitral stenosis.   5. The aortic valve is abnormal. Unable to determine aortic valve  morphology due to image quality. There is  moderate calcification of the  aortic valve. Aortic valve regurgitation is trivial. Moderate aortic valve  stenosis. Aortic valve area, by VTI  measures 1.16 cm. Aortic valve mean gradient measures 31.0 mmHg. Aortic  valve Vmax measures 3.58 m/s.   6. The inferior vena cava is dilated in size with >50% respiratory  variability, suggesting right atrial pressure of 8 mmHg.   7. Increased flow velocities may be secondary to anemia, thyrotoxicosis,  hyperdynamic or high flow state.    Laboratory Data:  High Sensitivity Troponin:   Recent [name]  Lab 09/17/22 1727 09/17/22 2119  TROPONINIHS 5 6     Chemistry Recent [name]  Lab 09/17/22 1705 09/17/22 1939 09/17/22 2119 09/18/22 0132 09/18/22 0418  NA 139 138  --  137 138  K 4.0 4.2  --  4.7 4.2  CL 85*  --   --   --  86*  CO2 44*  --   --   --  42*  GLUCOSE 193*  --   --   --  259*  BUN 17  --   --   --  21*  CREATININE 0.80  --   --   --  0.76  CALCIUM 8.5*  --   --   --  8.6*  MG  --   --  1.8  --  2.0  GFRNONAA >60  --   --   --  >60  ANIONGAP 10  --   --   --  10    Recent [name]  Lab 09/17/22 1705 09/18/22 0418  PROT 6.7 7.1  ALBUMIN 3.0* 3.0*  AST 12* 16  ALT 7 9  ALKPHOS 80 83  BILITOT 0.5 0.6   Lipids No results for input(s): "CHOL", "TRIG", "HDL", "LABVLDL", "LDLCALC", "CHOLHDL" in the last 168 hours.  Hematology Recent [name]  Lab 09/17/22 1705 09/17/22 1939 09/18/22 0132 09/18/22 0418  WBC 6.0  --   --  7.6  RBC 3.20*  --   --  3.21*  HGB 8.4* 9.9* 10.2* 8.6*  HCT 29.3* 29.0* 30.0* 29.1*  MCV 91.6  --   --  90.7  MCH 26.3  --   --  26.8  MCHC 28.7*  --   --  29.6*  RDW 15.9*  --   --  15.8*  PLT 236  --   --  229   Thyroid  Recent [name]  Lab 09/17/22 1705  TSH 3.200    BNP Recent [name]  Lab 09/17/22 1834  BNP 143.7*    DDimer No results for input(s): "DDIMER" in the last 168 hours.   Radiology/Studies:  DG Chest Portable 1 View  Result Date: 09/17/2022 CLINICAL DATA:  sob, cough  EXAM: PORTABLE CHEST 1 VIEW COMPARISON:  July 29, 2022 FINDINGS: The cardiomediastinal silhouette is unchanged and enlarged in contour.Perihilar vascular fullness with mild peribronchial cuffing. Retrocardiac opacity, likely atelectasis. No pleural effusion. No pneumothorax. IMPRESSION: Constellation of findings are favored to reflect mild pulmonary edema with LEFT basilar atelectasis. Electronically Signed   By: Valentino Saxon M.D.   On: 09/17/2022 17:26     Assessment and Plan:   56 year old with acute on chronic respiratory failure, moderate aortic stenosis, obesity hypoventilation syndrome on chronic O2 4 L.  Moderate aortic stenosis - At this point, does not require replacement.  We will continue to monitor as outpatient.  Acute on chronic respiratory failure - Find to be treated as both chronic diastolic heart failure as well as COPD exacerbation.  I am comfortable with her receiving IV Lasix, monitoring her BUN and creatinine closely.  She is also getting nebulizers Solu-Medrol continuous oxygen.  Magnesium is being repleted.  Paroxysmal atrial fibrillation and flutter - Previous office note from 2022 referred to from Dr. Geraldo Pitter.  Apparently she had been on high-dose amiodarone as a young lady.  Ablation had been recommended to be set up at [facility] at some point.  In review from care everywhere on 10/14 as well as 07/1819 22 she was called multiple times to try to schedule ablation but did not go through this.  Currently in sinus rhythm  We will follow along with you.    For questions or updates, please contact [place] Please consult [email or web address] for contact info under    Signed, Candee Furbish, MD  09/18/2022 2:17 PM

## 2022-09-18 NOTE — ED Notes (Signed)
PT continues to tolerate BIPAP well.

## 2022-09-18 NOTE — Progress Notes (Signed)
Patient continued to desat on [place] due too patient having sleep apnea. RT placed patient on BIPAP. Patient tolerating well at this time.

## 2022-09-18 NOTE — ED Notes (Signed)
Pt's sats dropped to 60s with good pleth as pt was resting. RN roused her and told her to take deep breaths and move some air in. Her sats went up to 98%. RT notified and adjusted flow.

## 2022-09-18 NOTE — Progress Notes (Signed)
PROGRESS NOTE    Chelsea [name]  ZGY:[id number] DOB: Feb 07, 1966 DOA: 09/17/2022 PCP: Pcp, No   Chief Complaint  Patient presents with   Shortness of Breath   Cough    Brief Narrative:    Chelsea [name] is a 56 y.o. female with medical history significant for chronic hypoxic respiratory failure on 4 L continuous nasal cannula, chronic diastolic heart failure, COPD, obesity hypoventilation syndrome, moderate aortic stenosis, paroxysmal A-fib, chronically anticoagulated on Eliquis.  Obstructive sleep apnea/questionable OHS, supposed to be on BiPAP at nighttime at her facility, apparently not using it due to some issues at the BiPAP, presents to [facility], ED secondary to acute on chronic hypoxic respiratory failure, due to volume overload, and possible COPD.  Assessment & Plan:   Principal Problem:   Acute on chronic respiratory failure with hypoxia (HCC) Active Problems:   Acute on chronic diastolic CHF (congestive heart failure) (HCC)   COPD with acute exacerbation (HCC)   Acquired hypothyroidism   DM2 (diabetes mellitus, type 2) (HCC)   Moderate aortic stenosis   Paroxysmal atrial fibrillation (HCC)   Chronic iron deficiency anemia   Acute on chronic respiratory failure with hypercarbia -Peers to be multiple factorial, most likely in the setting of at bedtime with not using BiPAP. -As well due to evidence of volume overload. -Questionable COPD exacerbation -Continue with BiPAP nightly, as needed daytime - She was encouraged to use incentive spirometer -To assist with ambulation and out of bed to chairWill consult PT/OT  Acute on chronic diastolic CHF -Continue with daily weights, strict ins and out -Continue with IV Lasix milligram IV twice daily, -Continue with daily weights, strict ins and out -She is on 4 L nasal cannula at baseline, and BiPAP at bedtime.  Atrial flutter (HCC) -It is controlled, she is not on any AV blocking agents - continue with  Eliquis for anticoagulation   COPD exacerbation -apparently she was with some wheezing initially, but this appears to have improved, so I will aim for rapid taper of her IV Solu-Medrol, will switch her to p.o. prednisone tomorrow -Continue with home meds  GERD Continue with PPI   Lymphedema of both lower extremities Chronic lymphedema, no clinical signs of infection   Morbid obesity Body mass index is 51.42 kg/m.  Anxiety Continue alprazolam.    Obesity hypoventilation syndrome ([place]) Patient had a trach in the past that now has been removed.  Continue close monitoring.  Bipap per home settings.    Hypothyroidism Continue home levothyroxine    Diabetes mellitus  Continue with insulin sliding scale-   DVT prophylaxis: Apixaban Code Status: Full Family Communication: None at bedside Disposition:   Status is: Inpatient    Consultants:  Cardiology  Subjective  PAP overnight, and nonrebreather as well this morning, she was changed to high flow nasal cannula for eating and BiPAP break  Objective: Vitals:   09/18/22 0800 09/18/22 1000 09/18/22 1100 09/18/22 1200  BP: 97/68 120/81 (!) 131/120 101/71  Pulse: 84 86 80 77  Resp: '18 17  20  '$ Temp:  98.3 F (36.8 C)    TempSrc:      SpO2: 91% 97% 91% 93%  Weight:      Height:        Intake/Output Summary (Last 24 hours) at 09/18/2022 1414 Last data filed at 09/18/2022 1300 Gross per 24 hour  Intake 250 ml  Output 1000 ml  Net -750 ml   Filed Weights   09/17/22 1651  Weight: (!) 140.2  kg    Examination:  Awake Alert, Oriented X 3, No new F.N deficits, Normal affect Symmetrical Chest wall movement, managed air entry at the bases, RRR,No Gallops,Rubs or new Murmurs, No Parasternal Heave +ve B.Sounds, No Cyanosis, Clubbing, trace edema, chronic lower extremity skin changes    Data Reviewed: I have personally reviewed following labs and imaging studies  CBC: Recent Labs  Lab 09/17/22 1705  09/17/22 1939 09/18/22 0132 09/18/22 0418  WBC 6.0  --   --  7.6  NEUTROABS 3.9  --   --  7.0  HGB 8.4* 9.9* 10.2* 8.6*  HCT 29.3* 29.0* 30.0* 29.1*  MCV 91.6  --   --  90.7  PLT 236  --   --  800    Basic Metabolic Panel: Recent Labs  Lab 09/17/22 1705 09/17/22 1939 09/17/22 2119 09/18/22 0132 09/18/22 0418  NA 139 138  --  137 138  K 4.0 4.2  --  4.7 4.2  CL 85*  --   --   --  86*  CO2 44*  --   --   --  42*  GLUCOSE 193*  --   --   --  259*  BUN 17  --   --   --  21*  CREATININE 0.80  --   --   --  0.76  CALCIUM 8.5*  --   --   --  8.6*  MG  --   --  1.8  --  2.0  PHOS  --   --   --   --  5.3*    GFR: Estimated Creatinine Clearance: 109.3 mL/min (by C-G formula based on SCr of 0.76 mg/dL).  Liver Function Tests: Recent Labs  Lab 09/17/22 1705 09/18/22 0418  AST 12* 16  ALT 7 9  ALKPHOS 80 83  BILITOT 0.5 0.6  PROT 6.7 7.1  ALBUMIN 3.0* 3.0*    CBG: Recent Labs  Lab 09/18/22 0739 09/18/22 1122  GLUCAP 253* 233*     Recent Results (from the past 240 hour(s))  Resp panel by RT-PCR (RSV, Flu A&B, Covid) Anterior Nasal Swab     Status: None   Collection Time: 09/17/22  5:05 PM   Specimen: Anterior Nasal Swab  Result Value Ref Range Status   SARS Coronavirus 2 by RT PCR NEGATIVE NEGATIVE Final    Comment: (NOTE) SARS-CoV-2 target nucleic acids are NOT DETECTED.  The SARS-CoV-2 RNA is generally detectable in upper respiratory specimens during the acute phase of infection. The lowest concentration of SARS-CoV-2 viral copies this assay can detect is 138 copies/mL. A negative result does not preclude SARS-Cov-2 infection and should not be used as the sole basis for treatment or other patient management decisions. A negative result may occur with  improper specimen collection/handling, submission of specimen other than nasopharyngeal swab, presence of viral mutation(s) within the areas targeted by this assay, and inadequate number of  viral copies(<138 copies/mL). A negative result must be combined with clinical observations, patient history, and epidemiological information. The expected result is Negative.  Fact Sheet for Patients:  EntrepreneurPulse.com.au  Fact Sheet for Healthcare Providers:  IncredibleEmployment.be  This test is no t yet approved or cleared by the [place] FDA and  has been authorized for detection and/or diagnosis of SARS-CoV-2 by FDA under an Emergency Use Authorization (EUA). This EUA will remain  in effect (meaning this test can be used) for the duration of the COVID-19 declaration under Section 564(b)(1) of the Act, 21  U.S.C.section 360bbb-3(b)(1), unless the authorization is terminated  or revoked sooner.       Influenza A by PCR NEGATIVE NEGATIVE Final   Influenza B by PCR NEGATIVE NEGATIVE Final    Comment: (NOTE) The Xpert Xpress SARS-CoV-2/FLU/RSV plus assay is intended as an aid in the diagnosis of influenza from Nasopharyngeal swab specimens and should not be used as a sole basis for treatment. Nasal washings and aspirates are unacceptable for Xpert Xpress SARS-CoV-2/FLU/RSV testing.  Fact Sheet for Patients: EntrepreneurPulse.com.au  Fact Sheet for Healthcare Providers: IncredibleEmployment.be  This test is not yet approved or cleared by the [place] FDA and has been authorized for detection and/or diagnosis of SARS-CoV-2 by FDA under an Emergency Use Authorization (EUA). This EUA will remain in effect (meaning this test can be used) for the duration of the COVID-19 declaration under Section 564(b)(1) of the Act, 21 U.S.C. section 360bbb-3(b)(1), unless the authorization is terminated or revoked.     Resp Syncytial Virus by PCR NEGATIVE NEGATIVE Final    Comment: (NOTE) Fact Sheet for Patients: EntrepreneurPulse.com.au  Fact Sheet for Healthcare  Providers: IncredibleEmployment.be  This test is not yet approved or cleared by the [place] FDA and has been authorized for detection and/or diagnosis of SARS-CoV-2 by FDA under an Emergency Use Authorization (EUA). This EUA will remain in effect (meaning this test can be used) for the duration of the COVID-19 declaration under Section 564(b)(1) of the Act, 21 U.S.C. section 360bbb-3(b)(1), unless the authorization is terminated or revoked.  Performed at [facility], [place] [address], [place], [place] [address]          Radiology Studies: DG Chest Portable 1 View  Result Date: 09/17/2022 CLINICAL DATA:  sob, cough EXAM: PORTABLE CHEST 1 VIEW COMPARISON:  July 29, 2022 FINDINGS: The cardiomediastinal silhouette is unchanged and enlarged in contour.Perihilar vascular fullness with mild peribronchial cuffing. Retrocardiac opacity, likely atelectasis. No pleural effusion. No pneumothorax. IMPRESSION: Constellation of findings are favored to reflect mild pulmonary edema with LEFT basilar atelectasis. Electronically Signed   By: Valentino Saxon M.D.   On: 09/17/2022 17:26        Scheduled Meds:  apixaban  5 mg Oral BID   ferrous sulfate  325 mg Oral Q breakfast   furosemide  80 mg Intravenous BID   gabapentin  400 mg Oral TID   insulin aspart  0-9 Units Subcutaneous TID WC   ipratropium-albuterol  3 mL Nebulization Q6H   levothyroxine  150 mcg Oral Q0600   methylPREDNISolone (SOLU-MEDROL) injection  80 mg Intravenous Q12H   senna-docusate  1 tablet Oral BID   Continuous Infusions:  azithromycin       LOS: 1 day        Phillips Climes, MD [facility]   To contact the attending provider between 7A-7P or the covering provider during after hours 7P-7A, please log into the web site [email or web address] and access using universal [place] password for that web site. If you do not have the password, please call the hospital  operator.  09/18/2022, 2:14 PM

## 2022-09-18 NOTE — ED Notes (Signed)
Help get patient straighten up in bed patient is now sitting up eating lunch

## 2022-09-19 DIAGNOSIS — D509 Iron deficiency anemia, unspecified: Secondary | ICD-10-CM | POA: Diagnosis not present

## 2022-09-19 DIAGNOSIS — Z6841 Body Mass Index (BMI) 40.0 and over, adult: Secondary | ICD-10-CM

## 2022-09-19 DIAGNOSIS — J441 Chronic obstructive pulmonary disease with (acute) exacerbation: Secondary | ICD-10-CM | POA: Diagnosis not present

## 2022-09-19 DIAGNOSIS — I5033 Acute on chronic diastolic (congestive) heart failure: Secondary | ICD-10-CM | POA: Diagnosis not present

## 2022-09-19 DIAGNOSIS — J9621 Acute and chronic respiratory failure with hypoxia: Secondary | ICD-10-CM | POA: Diagnosis not present

## 2022-09-19 LAB — CBC
HCT: 27.9 % — ABNORMAL LOW (ref 36.0–46.0)
Hemoglobin: 7.9 g/dL — ABNORMAL LOW (ref 12.0–15.0)
MCH: 25.8 pg — ABNORMAL LOW (ref 26.0–34.0)
MCHC: 28.3 g/dL — ABNORMAL LOW (ref 30.0–36.0)
MCV: 91.2 fL (ref 80.0–100.0)
Platelets: 260 10*3/uL (ref 150–400)
RBC: 3.06 MIL/uL — ABNORMAL LOW (ref 3.87–5.11)
RDW: 15.9 % — ABNORMAL HIGH (ref 11.5–15.5)
WBC: 7.2 10*3/uL (ref 4.0–10.5)
nRBC: 0 % (ref 0.0–0.2)

## 2022-09-19 LAB — GLUCOSE, CAPILLARY
Glucose-Capillary: 179 mg/dL — ABNORMAL HIGH (ref 70–99)
Glucose-Capillary: 222 mg/dL — ABNORMAL HIGH (ref 70–99)
Glucose-Capillary: 224 mg/dL — ABNORMAL HIGH (ref 70–99)
Glucose-Capillary: 213 mg/dL — ABNORMAL HIGH (ref 70–99)

## 2022-09-19 LAB — BASIC METABOLIC PANEL
Anion gap: 8 (ref 5–15)
BUN: 30 mg/dL — ABNORMAL HIGH (ref 6–20)
CO2: 43 mmol/L — ABNORMAL HIGH (ref 22–32)
Calcium: 8.9 mg/dL (ref 8.9–10.3)
Chloride: 87 mmol/L — ABNORMAL LOW (ref 98–111)
Creatinine, Ser: 0.73 mg/dL (ref 0.44–1.00)
GFR, Estimated: >60 mL/min (ref >60)
Glucose, Bld: 195 mg/dL — ABNORMAL HIGH (ref 70–99)
Potassium: 4.2 mmol/L (ref 3.5–5.1)
Sodium: 138 mmol/L (ref 135–145)

## 2022-09-19 MED ORDER — SODIUM CHLORIDE 0.9 % IV SOLN: INTRAVENOUS | Status: DC | PRN

## 2022-09-19 MED ORDER — LOPERAMIDE HCL 2 MG PO CAPS
2.0000 mg | ORAL_CAPSULE | ORAL | Status: DC | PRN
  Administered 2022-09-19 – 2022-09-24 (×3): 2 mg via ORAL
  Filled 2022-09-19 (×4): qty 1

## 2022-09-19 MED ORDER — MUSCLE RUB 10-15 % EX CREA
TOPICAL_CREAM | CUTANEOUS | Status: DC | PRN
  Filled 2022-09-19: qty 85

## 2022-09-19 MED ORDER — LORAZEPAM 2 MG/ML IJ SOLN
0.5000 mg | Freq: Once | INTRAMUSCULAR | Status: DC
  Filled 2022-09-19: qty 1

## 2022-09-19 MED ORDER — IPRATROPIUM-ALBUTEROL 0.5-2.5 (3) MG/3ML IN SOLN
3.0000 mL | Freq: Two times a day (BID) | RESPIRATORY_TRACT | Status: DC
  Administered 2022-09-19 – 2022-09-20 (×2): 3 mL via RESPIRATORY_TRACT
  Filled 2022-09-19 (×2): qty 3

## 2022-09-19 MED ORDER — SODIUM CHLORIDE 0.9 % IV SOLN
125.0000 mg | Freq: Every day | INTRAVENOUS | Status: AC
  Administered 2022-09-19 – 2022-09-20 (×2): 125 mg via INTRAVENOUS
  Filled 2022-09-19 (×2): qty 10

## 2022-09-19 NOTE — Progress Notes (Signed)
Rounding Note    Patient Name: Chelsea [name] Date of Encounter: 09/19/2022  [place] Cardiologist: None   Subjective   Laying fairly comfortably in bed.  No significant complaints.  Still short of breath however.  Inpatient Medications    Scheduled Meds:  apixaban  5 mg Oral BID   budesonide  0.5 mg Inhalation BID   cyanocobalamin  1,000 mcg Oral Daily   ferrous sulfate  325 mg Oral Q breakfast   folic acid  1 mg Oral Daily   furosemide  80 mg Intravenous BID   gabapentin  400 mg Oral TID   insulin aspart  0-9 Units Subcutaneous TID WC   ipratropium-albuterol  3 mL Nebulization BID   levothyroxine  150 mcg Oral Q0600   LORazepam  0.5 mg Intravenous Once   pantoprazole  40 mg Oral Daily   predniSONE  40 mg Oral Q breakfast   senna-docusate  1 tablet Oral BID   Continuous Infusions:  azithromycin Stopped (09/18/22 1624)   PRN Meds: acetaminophen **OR** acetaminophen, albuterol, diclofenac Sodium, HYDROcodone-acetaminophen, LORazepam, melatonin, polyethylene glycol   Vital Signs    Vitals:   09/19/22 0640 09/19/22 0728 09/19/22 0802 09/19/22 0922  BP:  119/70  100/62  Pulse: 64 70 82 80  Resp: '15 18 18 20  '$ Temp:  97.9 F (36.6 C)    TempSrc:  Oral    SpO2: 93% 98%  93%  Weight:      Height:        Intake/Output Summary (Last 24 hours) at 09/19/2022 0934 Last data filed at 09/19/2022 0800 Gross per 24 hour  Intake 1324.62 ml  Output 1750 ml  Net -425.38 ml      09/19/2022    4:40 AM 09/18/2022    7:48 PM 09/17/2022    4:51 PM  Last 3 Weights  Weight (lbs) 327 lb 9.7 oz 328 lb 7.8 oz 309 lb  Weight (kg) 148.6 kg 149 kg 140.161 kg      Telemetry    No adverse arrhythmias- Personally Reviewed  ECG    Sinus rhythm 80s nonspecific ST-T wave changes- Personally Reviewed  Physical Exam   GEN: No acute distress.  Morbidly obese Neck: No JVD Cardiac: RRR, 2/6 systolic aortic stenosis murmur,no rubs, or gallops.  Respiratory:  Clear to auscultation bilaterally. GI: Soft, nontender, non-distended  MS: Chronic edema; No deformity. Neuro:  Nonfocal  Psych: Normal affect   Labs    High Sensitivity Troponin:   Recent Labs  Lab 09/17/22 1727 09/17/22 2119  TROPONINIHS 5 6     Chemistry Recent Labs  Lab 09/17/22 1705 09/17/22 1939 09/17/22 2119 09/18/22 0132 09/18/22 0418 09/19/22 0038  NA 139   < >  --  137 138 138  K 4.0   < >  --  4.7 4.2 4.2  CL 85*  --   --   --  86* 87*  CO2 44*  --   --   --  42* 43*  GLUCOSE 193*  --   --   --  259* 195*  BUN 17  --   --   --  21* 30*  CREATININE 0.80  --   --   --  0.76 0.73  CALCIUM 8.5*  --   --   --  8.6* 8.9  MG  --   --  1.8  --  2.0  --   PROT 6.7  --   --   --  7.1  --   ALBUMIN 3.0*  --   --   --  3.0*  --   AST 12*  --   --   --  16  --   ALT 7  --   --   --  9  --   ALKPHOS 80  --   --   --  83  --   BILITOT 0.5  --   --   --  0.6  --   GFRNONAA >60  --   --   --  >60 >60  ANIONGAP 10  --   --   --  10 8   < > = values in this interval not displayed.    Lipids No results for input(s): "CHOL", "TRIG", "HDL", "LABVLDL", "LDLCALC", "CHOLHDL" in the last 168 hours.  Hematology Recent Labs  Lab 09/17/22 1705 09/17/22 1939 09/18/22 0132 09/18/22 0418 09/19/22 0038  WBC 6.0  --   --  7.6 7.2  RBC 3.20*  --   --  3.21* 3.06*  HGB 8.4*   < > 10.2* 8.6* 7.9*  HCT 29.3*   < > 30.0* 29.1* 27.9*  MCV 91.6  --   --  90.7 91.2  MCH 26.3  --   --  26.8 25.8*  MCHC 28.7*  --   --  29.6* 28.3*  RDW 15.9*  --   --  15.8* 15.9*  PLT 236  --   --  229 260   < > = values in this interval not displayed.   Thyroid  Recent Labs  Lab 09/17/22 1705  TSH 3.200    BNP Recent Labs  Lab 09/17/22 1834  BNP 143.7*    DDimer No results for input(s): "DDIMER" in the last 168 hours.   Radiology    DG Chest Portable 1 View  Result Date: 09/17/2022 CLINICAL DATA:  sob, cough EXAM: PORTABLE CHEST 1 VIEW COMPARISON:  July 29, 2022 FINDINGS: The  cardiomediastinal silhouette is unchanged and enlarged in contour.Perihilar vascular fullness with mild peribronchial cuffing. Retrocardiac opacity, likely atelectasis. No pleural effusion. No pneumothorax. IMPRESSION: Constellation of findings are favored to reflect mild pulmonary edema with LEFT basilar atelectasis. Electronically Signed   By: Valentino Saxon M.D.   On: 09/17/2022 17:26    Cardiac Studies   EF 65%.   Patient Profile     56 y.o. female  hx of chronic COPD who is being seen 09/18/2022 for the evaluation of shortness of breath with moderate aortic stenosis at the request of Dr. Waldron Labs   Assessment & Plan     Moderate aortic stenosis - At this point, does not require replacement.  We will continue to monitor as outpatient.  Not severe.   Acute on chronic respiratory failure - Find to be treated as both chronic diastolic heart failure as well as COPD exacerbation.  I am comfortable with her receiving IV 80 mg Lasix twice a day, monitoring her BUN and creatinine closely.  1.7 L out yesterday.  Creatinine stable at 0.7.  She is also getting nebulizers Solu-Medrol continuous oxygen.  Magnesium is being repleted.   Paroxysmal atrial fibrillation and flutter - Previous office note from 2022 referred to from Dr. Geraldo Pitter.  Apparently she had been on high-dose amiodarone as a young lady.  Ablation had been recommended to be set up at [facility] at some point.  In review from care everywhere on 10/14 as well as 07/1819 22 she was called multiple times  to try to schedule ablation but did not go through this.  Currently in sinus rhythm.  No need for further evaluation.  Previous GI bleed - On apixaban.  Hemoglobin this morning lower at 7.9.  Yesterday was 8.6.  Previously 10.2.  If necessary, hold Eliquis and have GI evaluation.  Morbid obesity hypoventilation syndrome - Bicarb 43 indicative of hypoventilation syndrome.  Continue with weight loss strategy.     For  questions or updates, please contact [place] Please consult [email or web address] for contact info under        Signed, Candee Furbish, MD  09/19/2022, 9:34 AM

## 2022-09-19 NOTE — Assessment & Plan Note (Signed)
Continue PPI ?

## 2022-09-19 NOTE — Assessment & Plan Note (Signed)
-   CPAP at night °

## 2022-09-19 NOTE — Assessment & Plan Note (Signed)
Glucoses elevated overnight, improved today - Continue SS corrections

## 2022-09-19 NOTE — Assessment & Plan Note (Addendum)
Chelsea [name] looks to be around normal, 10-12 g/dL as recently as a month ago.  Hemoglobin only 7.9 today but substantial variation in last 24 hours, this is hard to interpret.  No clinical bleeding reported to me to this point, nor noted by patient.  Admitted for symptomatic anemia in Oct 2023, had transfusion, endoscopy by [facility] without findings, but notes state "presumed upper GI bleed". - Trend Hgb - Given Hgb 7s, for now, will hold Eliquis, monitor stool - If Hgb drops lower, or frank melena noted, will reconsult [facility] for colonosocpy

## 2022-09-19 NOTE — Hospital Course (Addendum)
Chelsea [name] is a 56 y.o. F with asthma/COPD, dCHF and OHS and hx tracheostomy now changed, MO with BMI 54, and chronic respiratory failure on 3L home O2, anemia, AF on eliquis, DM, HTN lives in ALF, who presented with .. dyspnea. 12/18: Started on steroids, diuresis 12/19: Cardiology consulted 12/23.  Hypoxia worsening.  Started on continuous BiPAP.  Patient has been refusing BiPAP therapy in the hospital. 12/26.  Initiated on Tussionex nightly for cough.

## 2022-09-19 NOTE — Evaluation (Signed)
Occupational Therapy Evaluation and Discharge Patient Details Name: Chelsea [name] MRN: [id number] DOB: 03/26/1966 Today's Date: 09/19/2022   History of Present Illness Pt is a 56 y/o F admitted on 09/17/22 after presenting with c/o acute on chronic hypoxic respiratory failure 2/2 acute on chronic diastolic heart failure as well as suspected COPD exacerbation. PMH: chronic hypoxic respiratory failure on 4L continuous O2, chronic diastolic heart failure, COPD, obesity hypoventilation syndrome, OSA, moderate aortic stenosis, paroxysmal a-fib chronically anticoagulated, cervical CA, Hep C, gout, HLD, HTN, DM2   Clinical Impression   Pt is functioning at her baseline in mobility and ADLs. She is a resident of SNF and dependent in mobility and ADLs with exception of self feeding and grooming. Pt typically uses 4L 02, currently dependent on 8L to maintain Sp02>90% at rest. Drops to mid 80s with bed level mobility with visible shortness of breath. Recommend OOB nursing staff and lift equipment. No further OT needs.      Recommendations for follow up therapy are one component of a multi-disciplinary discharge planning process, led by the attending physician.  Recommendations may be updated based on patient status, additional functional criteria and insurance authorization.   Follow Up Recommendations  Skilled nursing-short term rehab (<3 hours/day)     Assistance Recommended at Discharge Intermittent Supervision/Assistance  Patient can return home with the following Two people to help with walking and/or transfers;A lot of help with bathing/dressing/bathroom;Assistance with cooking/housework;Direct supervision/assist for medications management;Direct supervision/assist for financial management;Assist for transportation;Help with stairs or ramp for entrance    Functional Status Assessment  Patient has not had a recent decline in their functional status  Equipment Recommendations  None  recommended by OT    Recommendations for Other Services       Precautions / Restrictions Precautions Precautions: Fall Restrictions Weight Bearing Restrictions: No      Mobility Bed Mobility Overal bed mobility: Needs Assistance Bed Mobility: Rolling Rolling: Max assist              Transfers                          Balance                                           ADL either performed or assessed with clinical judgement   ADL Overall ADL's : At baseline                                             Vision Ability to See in Adequate Light: 0 Adequate Patient Visual Report: No change from baseline       Perception     Praxis      Pertinent Vitals/Pain Pain Assessment Pain Assessment: No/denies pain     Hand Dominance Right   Extremity/Trunk Assessment Upper Extremity Assessment Upper Extremity Assessment: Overall WFL for tasks assessed;Generalized weakness   Lower Extremity Assessment Lower Extremity Assessment: Defer to PT evaluation   Cervical / Trunk Assessment Cervical / Trunk Assessment: Other exceptions (obesity)   Communication Communication Communication: No difficulties   Cognition Arousal/Alertness: Awake/alert Behavior During Therapy: WFL for tasks assessed/performed Overall Cognitive Status: Within Functional Limits for tasks assessed  General Comments       Exercises     Shoulder Instructions      Home Living Family/patient expects to be discharged to:: Skilled nursing facility                                 Additional Comments: pt is a resident of [name]      Prior Functioning/Environment Prior Level of Function : Needs assist             Mobility Comments: assisted for bed mobility, hoyer lift to chair ADLs Comments: Pt requires assistance with bathing, dressing, hygiene tasks,  IADLs        OT Problem List: Decreased strength      OT Treatment/Interventions:      OT Goals(Current goals can be found in the care plan section)    OT Frequency:      Co-evaluation              AM-PAC OT "6 Clicks" Daily Activity     Outcome Measure Help from another person eating meals?: None Help from another person taking care of personal grooming?: A Little Help from another person toileting, which includes using toliet, bedpan, or urinal?: Total Help from another person bathing (including washing, rinsing, drying)?: A Lot Help from another person to put on and taking off regular upper body clothing?: A Lot Help from another person to put on and taking off regular lower body clothing?: Total 6 Click Score: 13   End of Session    Activity Tolerance: Patient tolerated treatment well Patient left: in bed;with call bell/phone within reach  OT Visit Diagnosis: Muscle weakness (generalized) (M62.81)                Time: 2774-1287 OT Time Calculation (min): 14 min Charges:  OT General Charges $OT Visit: 1 Visit OT Evaluation $OT Eval Moderate Complexity: 1 Mod  [name], OTR/L Acute Rehabilitation Services Office: [phone]   [name] 09/19/2022, 9:44 AM

## 2022-09-19 NOTE — Assessment & Plan Note (Signed)
Chronic venous stasis change noted.

## 2022-09-19 NOTE — Assessment & Plan Note (Signed)
Presented with dyspnea and hypoxia requiring 10L O2.  Still on 10L.  Due to CHF, COPD.

## 2022-09-19 NOTE — Assessment & Plan Note (Signed)
BMI 54

## 2022-09-19 NOTE — Progress Notes (Signed)
PROGRESS NOTE    Chelsea [name]  GNF:[id number] DOB: 1966/03/17 DOA: 09/17/2022 PCP: Pcp, No   Chelsea [name] is a 56 y.o. F with asthma/COPD, dCHF and OHS and hx tracheostomy now changed, MO with BMI 54, and chronic respiratory failure on 4L home O2, anemia, AF on eliquis, DM, HTN lives in ALF, who presented with dyspnea. 12/18: Started on steroids, diuresis 12/19: Cardiology consulted Worsening anemia, Iv Fe given  Subjective: Complains of cough, congestion, still short of breath, on 7 L O2 this morning   Assessment and Plan:  Acute on chronic respiratory failure with hypoxia ([place]) Presented with dyspnea and hypoxia,   Due to CHF, COPD. -Continue to wean O2  Acute on chronic diastolic CHF -ECHO 84/69, EF 60-65%, grade 2DD, RV normal, mod AS -Continue IV [employer] agree with increasing dose today, she is 3.5 L negative, creatinine stable -Add Jardiance  COPD with acute exacerbation (HCC) - Continue prednisone -, DuoNebs, azithromycin -  pantoprazole -Increase activity, PT eval  Acute on Chronic iron deficiency anemia -recently admitted for symptomatic anemia in Oct 2023, had transfusion, endoscopy by [facility] without findings, but notes state "presumed upper GI bleed". - Hemoglobin dropped to the 7 range, Eliquis held for 48 hours, iron stores low, given IV iron -Hemoglobin improving, will resume Eliquis, continue PPI -CBC in a.m.  Class 3 severe obesity due to excess calories with serious comorbidity and body mass index (BMI) of 60.0 to 69.9 in adult (HCC) BMI 54  Paroxysmal atrial fibrillation (HCC) Rate controlled, CHA2DS2-Vasc only 3. - Hemoglobin stabilizing, will resume Eliquis  Moderate aortic stenosis Not active  DM2 (diabetes mellitus, type 2) (HCC) Glucoses elevated overnight, improved today - Continue SS corrections  Lymphedema of both lower extremities Chronic venous stasis change noted.  Depression - Continue Ativan  Obesity  hypoventilation syndrome (HCC) - CPAP at night  Acquired hypothyroidism - Continue levothyroxine  GERD - Continue PPI  Essential hypertension Blood pressure soft - Continue furosemide   Obstructive sleep apnea - CPAP at night   DVT prophylaxis: Eliquis Code Status: Full Code Family Communication: None present Disposition Plan: SNF likely 48 hours  Consultants:    Procedures:   Antimicrobials:    Objective: Vitals:   09/20/22 0110 09/20/22 0609 09/20/22 0929 09/20/22 1126  BP:  110/71  102/65  Pulse: 81  79 73  Resp: 16  (!) 21 20  Temp:  98.1 F (36.7 C)    TempSrc:  Oral    SpO2: 93%  92% 97%  Weight:      Height:        Intake/Output Summary (Last 24 hours) at 09/20/2022 1247 Last data filed at 09/20/2022 1056 Gross per 24 hour  Intake 1075 ml  Output 4600 ml  Net -3525 ml   Filed Weights   09/19/22 0440 09/19/22 2358 09/20/22 0032  Weight: (!) 148.6 kg (!) 146.7 kg (!) 146.5 kg    Examination:    Data Reviewed:   CBC: Recent Labs  Lab 09/17/22 1705 09/17/22 1939 09/18/22 0132 09/18/22 0418 09/19/22 0038 09/20/22 0052  WBC 6.0  --   --  7.6 7.2 8.3  NEUTROABS 3.9  --   --  7.0  --   --   HGB 8.4* 9.9* 10.2* 8.6* 7.9* 8.3*  HCT 29.3* 29.0* 30.0* 29.1* 27.9* 29.3*  MCV 91.6  --   --  90.7 91.2 91.6  PLT 236  --   --  229 260 629   Basic Metabolic  Panel: Recent Labs  Lab 09/17/22 1705 09/17/22 1939 09/17/22 2119 09/18/22 0132 09/18/22 0418 09/19/22 0038 09/20/22 0052  NA 139 138  --  137 138 138 139  K 4.0 4.2  --  4.7 4.2 4.2 4.0  CL 85*  --   --   --  86* 87* 88*  CO2 44*  --   --   --  42* 43* 43*  GLUCOSE 193*  --   --   --  259* 195* 162*  BUN 17  --   --   --  21* 30* 30*  CREATININE 0.80  --   --   --  0.76 0.73 0.77  CALCIUM 8.5*  --   --   --  8.6* 8.9 8.8*  MG  --   --  1.8  --  2.0  --   --   PHOS  --   --   --   --  5.3*  --   --    GFR: Estimated Creatinine Clearance: 112.3 mL/min (by C-G formula based on  SCr of 0.77 mg/dL). Liver Function Tests: Recent Labs  Lab 09/17/22 1705 09/18/22 0418  AST 12* 16  ALT 7 9  ALKPHOS 80 83  BILITOT 0.5 0.6  PROT 6.7 7.1  ALBUMIN 3.0* 3.0*   No results for input(s): "LIPASE", "AMYLASE" in the last 168 hours. No results for input(s): "AMMONIA" in the last 168 hours. Coagulation Profile: No results for input(s): "INR", "PROTIME" in the last 168 hours. Cardiac Enzymes: No results for input(s): "CKTOTAL", "CKMB", "CKMBINDEX", "TROPONINI" in the last 168 hours. BNP (last 3 results) No results for input(s): "PROBNP" in the last 8760 hours. HbA1C: No results for input(s): "HGBA1C" in the last 72 hours. CBG: Recent Labs  Lab 09/19/22 1123 09/19/22 1612 09/19/22 2115 09/20/22 0613 09/20/22 1124  GLUCAP 222* 224* 213* 124* 178*   Lipid Profile: No results for input(s): "CHOL", "HDL", "LDLCALC", "TRIG", "CHOLHDL", "LDLDIRECT" in the last 72 hours. Thyroid Function Tests: Recent Labs    09/17/22 1705  TSH 3.200   Anemia Panel: Recent Labs    09/17/22 1705  FERRITIN 12  TIBC 420  IRON 23*   Urine analysis:    Component Value Date/Time   COLORURINE STRAW (A) 07/04/2022 2300   APPEARANCEUR CLEAR 07/04/2022 2300   LABSPEC 1.008 07/04/2022 2300   PHURINE 6.0 07/04/2022 2300   GLUCOSEU NEGATIVE 07/04/2022 2300   HGBUR NEGATIVE 07/04/2022 2300   BILIRUBINUR NEGATIVE 07/04/2022 2300   KETONESUR NEGATIVE 07/04/2022 2300   PROTEINUR NEGATIVE 07/04/2022 2300   UROBILINOGEN 0.2 12/08/2010 0545   NITRITE NEGATIVE 07/04/2022 2300   LEUKOCYTESUR NEGATIVE 07/04/2022 2300   Sepsis Labs: '@LABRCNTIP'$ (procalcitonin:4,lacticidven:4)  ) Recent Results (from the past 240 hour(s))  Resp panel by RT-PCR (RSV, Flu A&B, Covid) Anterior Nasal Swab     Status: None   Collection Time: 09/17/22  5:05 PM   Specimen: Anterior Nasal Swab  Result Value Ref Range Status   SARS Coronavirus 2 by RT PCR NEGATIVE NEGATIVE Final    Comment: (NOTE) SARS-CoV-2  target nucleic acids are NOT DETECTED.  The SARS-CoV-2 RNA is generally detectable in upper respiratory specimens during the acute phase of infection. The lowest concentration of SARS-CoV-2 viral copies this assay can detect is 138 copies/mL. A negative result does not preclude SARS-Cov-2 infection and should not be used as the sole basis for treatment or other patient management decisions. A negative result may occur with  improper specimen collection/handling, submission of  specimen other than nasopharyngeal swab, presence of viral mutation(s) within the areas targeted by this assay, and inadequate number of viral copies(<138 copies/mL). A negative result must be combined with clinical observations, patient history, and epidemiological information. The expected result is Negative.  Fact Sheet for Patients:  EntrepreneurPulse.com.au  Fact Sheet for Healthcare Providers:  IncredibleEmployment.be  This test is no t yet approved or cleared by the [place] FDA and  has been authorized for detection and/or diagnosis of SARS-CoV-2 by FDA under an Emergency Use Authorization (EUA). This EUA will remain  in effect (meaning this test can be used) for the duration of the COVID-19 declaration under Section 564(b)(1) of the Act, 21 U.S.C.section 360bbb-3(b)(1), unless the authorization is terminated  or revoked sooner.       Influenza A by PCR NEGATIVE NEGATIVE Final   Influenza B by PCR NEGATIVE NEGATIVE Final    Comment: (NOTE) The Xpert Xpress SARS-CoV-2/FLU/RSV plus assay is intended as an aid in the diagnosis of influenza from Nasopharyngeal swab specimens and should not be used as a sole basis for treatment. Nasal washings and aspirates are unacceptable for Xpert Xpress SARS-CoV-2/FLU/RSV testing.  Fact Sheet for Patients: EntrepreneurPulse.com.au  Fact Sheet for Healthcare  Providers: IncredibleEmployment.be  This test is not yet approved or cleared by the [place] FDA and has been authorized for detection and/or diagnosis of SARS-CoV-2 by FDA under an Emergency Use Authorization (EUA). This EUA will remain in effect (meaning this test can be used) for the duration of the COVID-19 declaration under Section 564(b)(1) of the Act, 21 U.S.C. section 360bbb-3(b)(1), unless the authorization is terminated or revoked.     Resp Syncytial Virus by PCR NEGATIVE NEGATIVE Final    Comment: (NOTE) Fact Sheet for Patients: EntrepreneurPulse.com.au  Fact Sheet for Healthcare Providers: IncredibleEmployment.be  This test is not yet approved or cleared by the [place] FDA and has been authorized for detection and/or diagnosis of SARS-CoV-2 by FDA under an Emergency Use Authorization (EUA). This EUA will remain in effect (meaning this test can be used) for the duration of the COVID-19 declaration under Section 564(b)(1) of the Act, 21 U.S.C. section 360bbb-3(b)(1), unless the authorization is terminated or revoked.  Performed at [facility], [place] [address], [place], [place] [address]      Radiology Studies: No results found.   Scheduled Meds:  azithromycin  500 mg Oral Q24H   budesonide  0.5 mg Inhalation BID   cyanocobalamin  1,000 mcg Oral Daily   ferrous sulfate  325 mg Oral Q breakfast   folic acid  1 mg Oral Daily   furosemide  80 mg Intravenous BID   gabapentin  400 mg Oral TID   insulin aspart  0-9 Units Subcutaneous TID WC   ipratropium-albuterol  3 mL Nebulization BID   levothyroxine  150 mcg Oral Q0600   LORazepam  0.5 mg Intravenous Once   pantoprazole  40 mg Oral Daily   predniSONE  40 mg Oral Q breakfast   senna-docusate  1 tablet Oral BID   Continuous Infusions:  sodium chloride 10 mL/hr at 09/19/22 1758     LOS: 3 days    Time spent: 105mn    PDomenic Polite MD [facility]   09/20/2022, 12:47 PM

## 2022-09-19 NOTE — Assessment & Plan Note (Signed)
-   Continue prednisone - Continue scheduled bronchodilators - Continue azithromycin - Continue as needed bronchodilators, pantoprazole

## 2022-09-19 NOTE — Assessment & Plan Note (Signed)
-   Continue Ativan

## 2022-09-19 NOTE — Evaluation (Signed)
Physical Therapy Evaluation Patient Details Name: Chelsea [name] MRN: [id number] DOB: 02/11/1966 Today's Date: 09/19/2022  History of Present Illness  Pt is a 56 y/o F admitted on 09/17/22 after presenting with c/o acute on chronic hypoxic respiratory failure 2/2 acute on chronic diastolic heart failure as well as suspected COPD exacerbation. PMH: chronic hypoxic respiratory failure on 4L continuous O2, chronic diastolic heart failure, COPD, obesity hypoventilation syndrome, OSA, moderate aortic stenosis, paroxysmal a-fib chronically anticoagulated, cervical CA, Hep C, gout, HLD, HTN, DM2  Clinical Impression  Pt seen for PT evaluation with pt agreeable. Pt reports she hasn't been home since May 2022 but has instead been in hospital, [facility], or SNF rehab since. Pt reports she uses hoyer lift for OOB mobility but continues to work with rehab on standing. PT evaluation is limited by pt reporting need to use restroom. Pt requires max assist for repositioning in bed & rolling to L to allow PT to place bed pan. Pt on 8L/min via nasal cannula with SpO2 dropping as low as 84% with rolling, requiring ~1 minute to recover to >/= 90% with cuing for pursed lip breathing. Will continue to follow pt acutely to address endurance, strengthening, and bed mobility.    Recommendations for follow up therapy are one component of a multi-disciplinary discharge planning process, led by the attending physician.  Recommendations may be updated based on patient status, additional functional criteria and insurance authorization.  Follow Up Recommendations Skilled nursing-short term rehab (<3 hours/day) Can patient physically be transported by private vehicle: No    Assistance Recommended at Discharge Frequent or constant Supervision/Assistance  Patient can return home with the following  Two people to help with walking and/or transfers;Two people to help with bathing/dressing/bathroom    Equipment Recommendations  None recommended by PT  Recommendations for Other Services       Functional Status Assessment Patient has had a recent decline in their functional status and demonstrates the ability to make significant improvements in function in a reasonable and predictable amount of time.     Precautions / Restrictions Precautions Precautions: Fall Restrictions Weight Bearing Restrictions: No      Mobility  Bed Mobility Overal bed mobility: Needs Assistance Bed Mobility: Rolling Rolling: Max assist         General bed mobility comments: Pt requires assistance/cuing to reposition in bed & roll L with HOB slightly elevated & use of bed rails.    Transfers                        Ambulation/Gait                  Stairs            Wheelchair Mobility    Modified Rankin (Stroke Patients Only)       Balance                                             Pertinent Vitals/Pain Pain Assessment Pain Assessment: No/denies pain    Home Living Family/patient expects to be discharged to:: Skilled nursing facility                   Additional Comments: Pt reports she has been in hospital/LTACH or SNF since May 2022.    Prior Function Prior Level of Function : Needs assist  Mobility Comments: Pt reports she has been working with rehab at [facility]. Uses hoyer lift to get OOB. Requires assistance to transition supine>sit.       Hand Dominance        Extremity/Trunk Assessment   Upper Extremity Assessment Upper Extremity Assessment: Generalized weakness    Lower Extremity Assessment Lower Extremity Assessment: Generalized weakness       Communication   Communication: No difficulties  Cognition Arousal/Alertness: Awake/alert Behavior During Therapy: WFL for tasks assessed/performed Overall Cognitive Status: Within Functional Limits for tasks assessed                                           General Comments      Exercises     Assessment/Plan    PT Assessment Patient needs continued PT services  PT Problem List Decreased strength;Cardiopulmonary status limiting activity;Decreased activity tolerance;Decreased balance;Decreased knowledge of use of DME;Decreased mobility;Decreased knowledge of precautions       PT Treatment Interventions DME instruction;Therapeutic exercise;Balance training;Neuromuscular re-education;Functional mobility training;Therapeutic activities;Patient/family education;Gait training;Modalities    PT Goals (Current goals can be found in the Care Plan section)  Acute Rehab PT Goals Patient Stated Goal: get better PT Goal Formulation: With patient Time For Goal Achievement: 10/03/22 Potential to Achieve Goals: Fair    Frequency Min 2X/week     Co-evaluation               AM-PAC PT "6 Clicks" Mobility  Outcome Measure Help needed turning from your back to your side while in a flat bed without using bedrails?: Total Help needed moving from lying on your back to sitting on the side of a flat bed without using bedrails?: Total Help needed moving to and from a bed to a chair (including a wheelchair)?: Total Help needed standing up from a chair using your arms (e.g., wheelchair or bedside chair)?: Total Help needed to walk in hospital room?: Total Help needed climbing 3-5 steps with a railing? : Total 6 Click Score: 6    End of Session Equipment Utilized During Treatment: Oxygen Activity Tolerance: Patient tolerated treatment well Patient left: in bed;with call bell/phone within reach Nurse Communication: Mobility status PT Visit Diagnosis: Muscle weakness (generalized) (M62.81);Difficulty in walking, not elsewhere classified (R26.2)    Time: 1916-6060 PT Time Calculation (min) (ACUTE ONLY): 10 min   Charges:   PT Evaluation $PT Eval Moderate Complexity: [place], PT, DPT 09/19/22, 8:59 AM   Waunita Schooner 09/19/2022, 8:58 AM

## 2022-09-19 NOTE — Assessment & Plan Note (Signed)
Continue levothyroxine 

## 2022-09-19 NOTE — Assessment & Plan Note (Signed)
Blood pressure soft - Continue furosemide

## 2022-09-19 NOTE — Assessment & Plan Note (Signed)
Not active 

## 2022-09-19 NOTE — Inpatient Diabetes Management (Signed)
Inpatient Diabetes Program Recommendations  AACE/ADA: New Consensus Statement on Inpatient Glycemic Control (2015)  Target Ranges:  Prepandial:   less than 140 mg/dL      Peak postprandial:   less than 180 mg/dL (1-2 hours)      Critically ill patients:  140 - 180 mg/dL   Lab Results  Component Value Date   GLUCAP 179 (H) 09/19/2022   HGBA1C 5.2 07/30/2022    Latest Reference Range & Units 09/18/22 07:39 09/18/22 11:22 09/18/22 16:23 09/18/22 20:56 09/19/22 06:23  Glucose-Capillary 70 - 99 mg/dL 253 (H) 233 (H) 254 (H) 240 (H) 179 (H)  (H): Data is abnormally high  Diabetes history: DM2 Outpatient Diabetes medications: Novolog 0-24 tid Current orders for Inpatient glycemic control: Novolog 0-9 units tid, Prednisone 40 mg qd (50% reduction today)  Inpatient Diabetes Program Recommendations:   While on steroids, consider: -Increase Novolog correction to 0-15 units tid  Thank you, Nani Gasser. Yanci Bachtell, RN, MSN, CDE  Diabetes Coordinator Inpatient Glycemic Control Team Team Pager [phone] (8am-5pm) 09/19/2022 10:33 AM

## 2022-09-19 NOTE — Assessment & Plan Note (Signed)
Weight down half a kilogram, no insults recorded in the ER - Continue IV Lasix - Hold home torsemide - Monitor potassium daily, strict ins and outs - Consult cardiology, appreciate cares

## 2022-09-19 NOTE — Assessment & Plan Note (Addendum)
Rate controlled, CHA2DS2-Vasc only 3. -Hold Eliquis given significant drop in hemoglobin, if clinical suspicion for GI bleed abates, in next day or two, can resume Eliquis

## 2022-09-19 NOTE — Progress Notes (Signed)
Progress Note   Patient: Chelsea [name] PRF:[id number] DOB: 1966/06/12 DOA: 09/17/2022     2 DOS: the patient was seen and examined on 09/19/2022        Brief hospital course: Mrs. Chelsea [name] is a 56 y.o. F with asthma/COPD, dCHF and OHS and hx tracheostomy now changed, MO with BMI 54, and chronic respiratory failure on 3L home O2, anemia, AF on eliquis, DM, HTN lives in ALF, who presented with .. dyspnea.    12/18: Started on steroids, diuresis 12/19: Cardiology consulted     Assessment and Plan: * Acute on chronic respiratory failure with hypoxia and hypercarbia Presented with dyspnea and hypoxia requiring 10L O2.  VBG showed pH normal, CO2 80.  Baseline HCO3 >40.  Still on 10L.  Due to CHF, COPD.     Acute on chronic diastolic CHF (congestive heart failure) (HCC) Weight down half a kilogram, no insults recorded in the ER - Continue IV Lasix - Hold home torsemide - Monitor potassium daily, strict ins and outs - Consult cardiology, appreciate cares    COPD with acute exacerbation ([place]) - Continue prednisone - Continue scheduled bronchodilators - Continue azithromycin - Continue as needed bronchodilators, pantoprazole    Acute on chronic iron deficiency anemia [place] looks to be around normal, 10-12 g/dL as recently as a month ago.  Hemoglobin only 7.9 today but substantial variation in last 24 hours, this is hard to interpret.  No clinical bleeding reported to me to this point, nor noted by patient.  Admitted for symptomatic anemia in Oct 2023, had transfusion, endoscopy by [facility] without findings, but notes state "presumed upper GI bleed".  Iron studies here low. - Give IV iron - Trend Hgb - Given Hgb 7s, for now, will hold Eliquis, monitor stool - If Hgb drops lower, or frank melena noted, will reconsult [facility] for colonosocpy    Paroxysmal atrial fibrillation (HCC) Rate controlled, CHA2DS2-Vasc only 3. -Hold Eliquis given significant drop in  hemoglobin, if clinical suspicion for GI bleed abates, in next day or two, can resume Eliquis    Class 3 severe obesity due to excess calories with serious comorbidity and body mass index (BMI) of 60.0 to 69.9 in adult (HCC) BMI 54   Moderate aortic stenosis Not active  DM2 (diabetes mellitus, type 2) (HCC) Glucoses elevated overnight, improved today - Continue SS corrections  Lymphedema of both lower extremities Chronic venous stasis change noted.  Depression - Continue Ativan  Obesity hypoventilation syndrome (HCC) Baseline HCO3 >40. - CPAP at night  Acquired hypothyroidism - Continue levothyroxine  GERD - Continue PPI  Essential hypertension Blood pressure soft - Continue furosemide   Obstructive sleep apnea - CPAP at night          Subjective: having diarrhea, chronic arm pain.  No fever, confusion.  No respiratory distress.  Still swollen.     Physical Exam: BP 94/62 (BP Location: Left Wrist)   Pulse 82   Temp 98.3 F (36.8 C) (Oral)   Resp 20   Ht '5\' 5"'$  (1.651 m)   Wt (!) 148.6 kg   SpO2 92%   BMI 54.52 kg/m   Morbidly obese adult female, lying in bed, no acute distress RRR, heart sounds distant due to body habitus, chronic venous stasis changes and brawny edema of the bilateral lower extremities, JVP not visible due to body habitus Respiratory rate seems increased, breath sounds diminished due to body habitus Weight and volume status, not possible to assess given body habitus  Attention normal, affect blunted, judgment insight appear normal, oriented to person, place, time, speech fluent, face symmetric, mild weakness in all 4 extremities    Data Reviewed: Cardiology note reviewed VBG shows normal pH, CO2 82 Hemoglobin down to 7.9 from baseline around 12 just a month ago Glucose elevated yesterday, down overnight Metabolic panel shows creatinine stable, potassium and sodium normal    Family Communication: None  present    Disposition: Status is: Inpatient Patient admitted from facility with CHF and COPD causing respiratory failure  She seems somewhat better, but not weaned to her home oxygen yet, continuing IV diuresis  Hopefully in the next 1 to 2 days, her Hgb wil remain stable, she will be back to her home oxygen, we can transition to oral diuretics at discharge          Author: Edwin Dada, MD 09/19/2022 2:36 PM  For on call review [email or web address].

## 2022-09-20 DIAGNOSIS — J9621 Acute and chronic respiratory failure with hypoxia: Secondary | ICD-10-CM | POA: Diagnosis not present

## 2022-09-20 LAB — GLUCOSE, CAPILLARY
Glucose-Capillary: 124 mg/dL — ABNORMAL HIGH (ref 70–99)
Glucose-Capillary: 178 mg/dL — ABNORMAL HIGH (ref 70–99)
Glucose-Capillary: 276 mg/dL — ABNORMAL HIGH (ref 70–99)
Glucose-Capillary: 183 mg/dL — ABNORMAL HIGH (ref 70–99)

## 2022-09-20 LAB — CBC
HCT: 29.3 % — ABNORMAL LOW (ref 36.0–46.0)
Hemoglobin: 8.3 g/dL — ABNORMAL LOW (ref 12.0–15.0)
MCH: 25.9 pg — ABNORMAL LOW (ref 26.0–34.0)
MCHC: 28.3 g/dL — ABNORMAL LOW (ref 30.0–36.0)
MCV: 91.6 fL (ref 80.0–100.0)
Platelets: 254 10*3/uL (ref 150–400)
RBC: 3.2 MIL/uL — ABNORMAL LOW (ref 3.87–5.11)
RDW: 15.7 % — ABNORMAL HIGH (ref 11.5–15.5)
WBC: 8.3 10*3/uL (ref 4.0–10.5)
nRBC: 0 % (ref 0.0–0.2)

## 2022-09-20 LAB — BASIC METABOLIC PANEL
Anion gap: 8 (ref 5–15)
BUN: 30 mg/dL — ABNORMAL HIGH (ref 6–20)
CO2: 43 mmol/L — ABNORMAL HIGH (ref 22–32)
Calcium: 8.8 mg/dL — ABNORMAL LOW (ref 8.9–10.3)
Chloride: 88 mmol/L — ABNORMAL LOW (ref 98–111)
Creatinine, Ser: 0.77 mg/dL (ref 0.44–1.00)
GFR, Estimated: >60 mL/min (ref >60)
Glucose, Bld: 162 mg/dL — ABNORMAL HIGH (ref 70–99)
Potassium: 4 mmol/L (ref 3.5–5.1)
Sodium: 139 mmol/L (ref 135–145)

## 2022-09-20 MED ORDER — EMPAGLIFLOZIN 10 MG PO TABS
10.0000 mg | ORAL_TABLET | Freq: Every day | ORAL | Status: DC
  Administered 2022-09-20 – 2022-09-21 (×2): 10 mg via ORAL
  Filled 2022-09-20 (×2): qty 1

## 2022-09-20 MED ORDER — IPRATROPIUM-ALBUTEROL 0.5-2.5 (3) MG/3ML IN SOLN
3.0000 mL | Freq: Two times a day (BID) | RESPIRATORY_TRACT | Status: DC
  Administered 2022-09-20 – 2022-09-28 (×16): 3 mL via RESPIRATORY_TRACT
  Filled 2022-09-20 (×16): qty 3

## 2022-09-20 MED ORDER — APIXABAN 5 MG PO TABS
5.0000 mg | ORAL_TABLET | Freq: Two times a day (BID) | ORAL | Status: DC
  Administered 2022-09-20 – 2022-10-02 (×25): 5 mg via ORAL
  Filled 2022-09-20 (×25): qty 1

## 2022-09-20 MED ORDER — PREDNISONE 20 MG PO TABS
30.0000 mg | ORAL_TABLET | Freq: Every day | ORAL | Status: DC
  Administered 2022-09-21 – 2022-09-22 (×2): 30 mg via ORAL
  Filled 2022-09-20 (×2): qty 1

## 2022-09-20 MED ORDER — IPRATROPIUM-ALBUTEROL 0.5-2.5 (3) MG/3ML IN SOLN: 3.0000 mL | Freq: Three times a day (TID) | RESPIRATORY_TRACT | Status: DC

## 2022-09-20 MED ORDER — AZITHROMYCIN 250 MG PO TABS
500.0000 mg | ORAL_TABLET | ORAL | Status: AC
  Administered 2022-09-20 – 2022-09-21 (×2): 500 mg via ORAL
  Filled 2022-09-20 (×2): qty 2

## 2022-09-20 NOTE — Progress Notes (Signed)
Patient stated she doesn't want to go on BIPAP until 1 am. RN aware.

## 2022-09-20 NOTE — Plan of Care (Signed)
Pt had large BM this morning. Bath given this evening.  Problem: Education: Goal: Ability to describe self-care measures that may prevent or decrease complications (Diabetes Survival Skills Education) will improve Outcome: Progressing Goal: Individualized Educational Video(s) Outcome: Progressing   Problem: Coping: Goal: Ability to adjust to condition or change in health will improve Outcome: Progressing   Problem: Fluid Volume: Goal: Ability to maintain a balanced intake and output will improve Outcome: Progressing   Problem: Health Behavior/Discharge Planning: Goal: Ability to identify and utilize available resources and services will improve Outcome: Progressing Goal: Ability to manage health-related needs will improve Outcome: Progressing   Problem: Metabolic: Goal: Ability to maintain appropriate glucose levels will improve Outcome: Progressing   Problem: Nutritional: Goal: Maintenance of adequate nutrition will improve Outcome: Progressing Goal: Progress toward achieving an optimal weight will improve Outcome: Progressing   Problem: Skin Integrity: Goal: Risk for impaired skin integrity will decrease Outcome: Progressing   Problem: Tissue Perfusion: Goal: Adequacy of tissue perfusion will improve Outcome: Progressing   Problem: Education: Goal: Knowledge of General Education information will improve Description: Including pain rating scale, medication(s)/side effects and non-pharmacologic comfort measures Outcome: Progressing   Problem: Health Behavior/Discharge Planning: Goal: Ability to manage health-related needs will improve Outcome: Progressing   Problem: Clinical Measurements: Goal: Ability to maintain clinical measurements within normal limits will improve Outcome: Progressing Goal: Will remain free from infection Outcome: Progressing Goal: Diagnostic test results will improve Outcome: Progressing Goal: Respiratory complications will  improve Outcome: Progressing Goal: Cardiovascular complication will be avoided Outcome: Progressing   Problem: Activity: Goal: Risk for activity intolerance will decrease Outcome: Progressing   Problem: Nutrition: Goal: Adequate nutrition will be maintained Outcome: Progressing   Problem: Coping: Goal: Level of anxiety will decrease Outcome: Progressing   Problem: Elimination: Goal: Will not experience complications related to bowel motility Outcome: Progressing Goal: Will not experience complications related to urinary retention Outcome: Progressing   Problem: Pain Managment: Goal: General experience of comfort will improve Outcome: Progressing   Problem: Safety: Goal: Ability to remain free from injury will improve Outcome: Progressing   Problem: Skin Integrity: Goal: Risk for impaired skin integrity will decrease Outcome: Progressing

## 2022-09-20 NOTE — NC FL2 (Signed)
[place] LEVEL OF CARE FORM     IDENTIFICATION  Patient Name: Chelsea [name] Birthdate: November 12, 1965 Sex: female Admission Date (Current Location): 09/17/2022  [facility] and [place] Number:  Herbalist and Address:  [facility], [place] [address] [place], [place] [address]      Provider Number: [id number]  Attending Physician Name and Address:  Domenic Polite, MD  Relative Name and Phone Number:  Pricilla Riffle (Significant other)  [phone] (Mobile)    Current Level of Care: Hospital Recommended Level of Care: [place] Prior Approval Number:    Date Approved/Denied:   PASRR Number: [id number]  Discharge Plan: SNF    Current Diagnoses: Patient Active Problem List   Diagnosis Date Noted   Chronic iron deficiency anemia 09/18/2022   Acute on chronic respiratory failure with hypoxia ([place]) 09/17/2022   Chronic disease anemia 08/01/2022   CHF exacerbation ([place]) 07/30/2022   CHF (congestive heart failure) ([place]) 07/30/2022   Upper GI bleed 07/18/2022   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   Acute blood loss anemia 07/18/2022   Acute on chronic hypoxic respiratory failure ([place]) 07/04/2022   Bleeding of the respiratory tract 04/07/2022   Acute on chronic respiratory failure with hypoxia and hypercapnia ([place]) 04/07/2022   Pulmonary edema, acute ([place]) 02/21/2022   Restrictive lung disease secondary to obesity 01/28/2022   Symptomatic anemia 01/04/2022   Limitation of activity due to disability 09/18/2021   Paroxysmal atrial fibrillation ([place]) 09/04/2021   DJD (degenerative joint disease) 07/03/2021   Venous insufficiency of both lower extremities 07/02/2021   Physical deconditioning 04/24/2021   Depression 04/20/2021   History of tobacco use 04/20/2021   Hyperglycemia due to type 2 diabetes mellitus ([place]) 04/20/2021   Irritable bowel syndrome with constipation  04/20/2021   Microalbuminuria 04/20/2021   On supplemental oxygen by nasal cannula 04/20/2021   Renal insufficiency 04/20/2021   Heart failure ([place]) 04/20/2021   Moderate aortic stenosis 04/20/2021   Mild tricuspid regurgitation 04/02/2021   Mitral valve sclerosis 04/02/2021   Morbid obesity with BMI of 50.0-59.9, adult ([place]) 04/02/2021   Non-smoker 04/02/2021   Awaiting admission to adequate facility elsewhere 46/65/9935   Metabolic alkalosis 70/17/7939   Restless leg syndrome 03/22/2021   Weakness 03/20/2021   Acute congestive heart failure ([place]) 03/18/2021   Hx of atrial flutter 03/15/2021   Weight loss 03/15/2021   Atrial flutter ([place]) 03/09/2021   S/P hysterectomy 02/19/2021   Pulmonary nodule less than 6 cm determined by computed tomography of lung 02/09/2021   Dyspnea on exertion 02/09/2021   Nocturnal hypoxemia 06/03/2020   Mixed hyperlipidemia 04/18/2020   Hair loss 09/17/2019   Chronic pain of multiple joints 06/18/2019   Irritable bowel syndrome with diarrhea 06/18/2019   Onychomycosis 06/18/2019   Acute idiopathic gout of left hand 02/16/2019   Tinea pedis of both feet 11/12/2018   Hypertension associated with diabetes ([place]) 08/25/2018   Class 3 severe obesity with serious comorbidity and body mass index (BMI) of 50.0 to 59.9 in adult ([place]) 08/25/2018   Class 3 severe obesity due to excess calories with serious comorbidity and body mass index (BMI) of 60.0 to 69.9 in adult ([place]) 08/25/2018   Chronic respiratory failure with hypoxia ([place]) 07/02/2018   Physical debility 05/10/2018   Hepatitis C    Neuropathy due to type 2 diabetes mellitus ([place]) 02/24/2018   Chronic heart failure with preserved ejection fraction ([place]) 02/23/2018   Dyspnea 12/05/2017  Acute on chronic diastolic CHF (congestive heart failure) ([place]) 09/25/2017   Pneumonia due to gram-positive bacteria 08/24/2017   Acute respiratory failure with hypoxia and hypercarbia (HCC) 05/27/2016   Hyperlipidemia  09/28/2015   COPD with acute exacerbation ([place]) 03/04/2015   Bronchitis 05/30/2011   Cellulitis, leg 04/29/2011   Neuropathic pain, leg 04/09/2011   Mononeuropathy of lower extremity 04/09/2011   Edema 03/22/2011   Morbid obesity ([place]) 03/22/2011   History of cervical cancer 03/22/2011   Lymphedema of both lower extremities 03/22/2011   Morbid (severe) obesity due to excess calories ([place]) 03/22/2011   Ankle pain, left 03/14/2011   Obesity hypoventilation syndrome ([place]) 01/01/2011   Osteoarthritis 11/28/2010   Chronic diastolic heart failure ([place]) 11/14/2010   Acquired hypothyroidism 11/13/2010   UNSPEC COMBINED SYSTOLIC&DIASTOLIC HEART FAILURE 72/53/6644   HYPERTHYROIDISM 11/10/2010   DM 11/10/2010   Obstructive sleep apnea 11/10/2010   Essential hypertension 11/10/2010   GERD 11/10/2010   Gastro-esophageal reflux disease without esophagitis 11/10/2010   Essential (primary) hypertension 11/10/2010   Obstructive sleep apnea (adult) (pediatric) 11/10/2010   DM2 (diabetes mellitus, type 2) ([place]) 11/10/2010    Orientation RESPIRATION BLADDER Height & Weight     Self, Time, Situation, Place  O2 (6L [place]) External catheter Weight: (!) 322 lb 15.6 oz (146.5 kg) Height:  '5\' 5"'$  (165.1 cm)  BEHAVIORAL SYMPTOMS/MOOD NEUROLOGICAL BOWEL NUTRITION STATUS      Continent Diet (See d/c summary)  AMBULATORY STATUS COMMUNICATION OF NEEDS Skin   Extensive Assist Verbally Normal                       Personal Care Assistance Level of Assistance  Bathing, Feeding, Dressing Bathing Assistance: Maximum assistance Feeding assistance: Independent Dressing Assistance: Maximum assistance     Functional Limitations Info  Sight, Hearing, Speech Sight Info: Impaired Hearing Info: Impaired Speech Info: Impaired    SPECIAL CARE FACTORS FREQUENCY  OT (By licensed OT), PT (By licensed PT)     PT Frequency: 5x/week OT Frequency: 5x/week            Contractures Contractures Info: Not  present    Additional Factors Info  Code Status, Allergies Code Status Info: Full code Allergies Info: Iodinated Contrast Media, penicillins, Insulin Lispro, Minoxidil           Current Medications (09/20/2022):  This is the current hospital active medication list Current Facility-Administered Medications  Medication Dose Route Frequency Provider Last Rate Last Admin   0.9 %  sodium chloride infusion   Intravenous PRN Edwin Dada, MD 10 mL/hr at 09/19/22 1758 New Bag at 09/19/22 1758   acetaminophen (TYLENOL) tablet 650 mg  650 mg Oral Q6H PRN Howerter, Justin B, DO   650 mg at 09/18/22 1139   Or   acetaminophen (TYLENOL) suppository 650 mg  650 mg Rectal Q6H PRN Howerter, Justin B, DO       albuterol (PROVENTIL) (2.5 MG/3ML) 0.083% nebulizer solution 2.5 mg  2.5 mg Nebulization Q4H PRN Howerter, Justin B, DO       apixaban (ELIQUIS) tablet 5 mg  5 mg Oral BID Domenic Polite, MD       azithromycin [facility]) tablet 500 mg  500 mg Oral Q24H Domenic Polite, MD       budesonide (PULMICORT) nebulizer solution 0.5 mg  0.5 mg Inhalation BID Elgergawy, Silver Huguenin, MD   0.5 mg at 09/20/22 0347   cyanocobalamin (VITAMIN B12) tablet 1,000 mcg  1,000 mcg Oral Daily Elgergawy, Emeline Gins  S, MD   1,000 mcg at 09/20/22 2353   diclofenac Sodium (VOLTAREN) 1 % topical gel 4 g  4 g Topical Q6H PRN Elgergawy, Silver Huguenin, MD       empagliflozin (JARDIANCE) tablet 10 mg  10 mg Oral Daily Domenic Polite, MD       ferrous sulfate tablet 325 mg  325 mg Oral Q breakfast Howerter, Justin B, DO   325 mg at 61/44/31 5400   folic acid (FOLVITE) tablet 1 mg  1 mg Oral Daily Elgergawy, Silver Huguenin, MD   1 mg at 09/20/22 0917   furosemide (LASIX) injection 80 mg  80 mg Intravenous BID Howerter, Justin B, DO   80 mg at 09/20/22 0918   gabapentin (NEURONTIN) capsule 400 mg  400 mg Oral TID Howerter, Justin B, DO   400 mg at 09/20/22 8676   HYDROcodone-acetaminophen (NORCO/VICODIN) 5-325 MG per tablet 1 tablet  1  tablet Oral Q6H PRN Elgergawy, Silver Huguenin, MD   1 tablet at 09/20/22 0917   insulin aspart (novoLOG) injection 0-9 Units  0-9 Units Subcutaneous TID WC Howerter, Justin B, DO   2 Units at 09/20/22 1215   ipratropium-albuterol (DUONEB) 0.5-2.5 (3) MG/3ML nebulizer solution 3 mL  3 mL Nebulization BID Domenic Polite, MD       levothyroxine (SYNTHROID) tablet 150 mcg  150 mcg Oral Q0600 Howerter, Justin B, DO   150 mcg at 09/20/22 1950   loperamide (IMODIUM) capsule 2 mg  2 mg Oral PRN Edwin Dada, MD   2 mg at 09/20/22 0917   LORazepam (ATIVAN) injection 0.5 mg  0.5 mg Intravenous Q4H PRN Howerter, Justin B, DO   0.5 mg at 09/19/22 2320   LORazepam (ATIVAN) injection 0.5 mg  0.5 mg Intravenous Once Crosley, Debby, MD       melatonin tablet 3 mg  3 mg Oral QHS PRN Howerter, Justin B, DO   3 mg at 09/19/22 0143   Muscle Rub CREA   Topical PRN Edwin Dada, MD   Given at 09/19/22 1157   pantoprazole (PROTONIX) EC tablet 40 mg  40 mg Oral Daily Elgergawy, Silver Huguenin, MD   40 mg at 09/20/22 0917   polyethylene glycol (MIRALAX / GLYCOLAX) packet 17 g  17 g Oral Daily PRN Howerter, Justin B, DO       [START ON 09/21/2022] predniSONE (DELTASONE) tablet 30 mg  30 mg Oral Q breakfast Domenic Polite, MD       senna-docusate (Senokot-S) tablet 1 tablet  1 tablet Oral BID Howerter, Justin B, DO   1 tablet at 09/18/22 0906     Discharge Medications: Please see discharge summary for a list of discharge medications.  Relevant Imaging Results:  Relevant Lab Results:   Additional [place], LCSW

## 2022-09-20 NOTE — Progress Notes (Signed)
Rounding Note    Patient Name: Chelsea [name] Date of Encounter: 09/20/2022  [place] Cardiologist: None   Subjective   Fairly comfortable at this time.  Inpatient Medications    Scheduled Meds:  azithromycin  500 mg Oral Q24H   budesonide  0.5 mg Inhalation BID   cyanocobalamin  1,000 mcg Oral Daily   ferrous sulfate  325 mg Oral Q breakfast   folic acid  1 mg Oral Daily   furosemide  80 mg Intravenous BID   gabapentin  400 mg Oral TID   insulin aspart  0-9 Units Subcutaneous TID WC   ipratropium-albuterol  3 mL Nebulization BID   levothyroxine  150 mcg Oral Q0600   LORazepam  0.5 mg Intravenous Once   pantoprazole  40 mg Oral Daily   predniSONE  40 mg Oral Q breakfast   senna-docusate  1 tablet Oral BID   Continuous Infusions:  sodium chloride 10 mL/hr at 09/19/22 1758   ferric gluconate (FERRLECIT) IVPB 125 mg (09/20/22 0948)   PRN Meds: sodium chloride, acetaminophen **OR** acetaminophen, albuterol, diclofenac Sodium, HYDROcodone-acetaminophen, loperamide, LORazepam, melatonin, Muscle Rub, polyethylene glycol   Vital Signs    Vitals:   09/20/22 0032 09/20/22 0110 09/20/22 0609 09/20/22 0929  BP: 114/79  110/71   Pulse:  81  79  Resp:  16  (!) 21  Temp: 98.2 F (36.8 C)  98.1 F (36.7 C)   TempSrc: Oral  Oral   SpO2:  93%  92%  Weight: (!) 146.5 kg     Height:        Intake/Output Summary (Last 24 hours) at 09/20/2022 1047 Last data filed at 09/20/2022 0830 Gross per 24 hour  Intake 1075 ml  Output 4400 ml  Net -3325 ml      09/20/2022   12:32 AM 09/19/2022   11:58 PM 09/19/2022    4:40 AM  Last 3 Weights  Weight (lbs) 322 lb 15.6 oz 323 lb 6.6 oz 327 lb 9.7 oz  Weight (kg) 146.5 kg 146.7 kg 148.6 kg      Telemetry    No adverse arrhythmias- Personally Reviewed  ECG    Sinus rhythm 80s nonspecific ST-T wave changes- Personally Reviewed  Physical Exam   GEN: No acute distress.  Morbidly obese Neck: No  JVD Cardiac: RRR, no change in aortic murmur 2/6,no rubs, or gallops.  Respiratory: Clear to auscultation bilaterally. GI: Soft, nontender, non-distended  MS: Chronic edema; No deformity. Neuro:  Nonfocal  Psych: Normal affect   Labs    High Sensitivity Troponin:   Recent Labs  Lab 09/17/22 1727 09/17/22 2119  TROPONINIHS 5 6     Chemistry Recent Labs  Lab 09/17/22 1705 09/17/22 1939 09/17/22 2119 09/18/22 0132 09/18/22 0418 09/19/22 0038 09/20/22 0052  NA 139   < >  --    < > 138 138 139  K 4.0   < >  --    < > 4.2 4.2 4.0  CL 85*  --   --   --  86* 87* 88*  CO2 44*  --   --   --  42* 43* 43*  GLUCOSE 193*  --   --   --  259* 195* 162*  BUN 17  --   --   --  21* 30* 30*  CREATININE 0.80  --   --   --  0.76 0.73 0.77  CALCIUM 8.5*  --   --   --  8.6* 8.9 8.8*  MG  --   --  1.8  --  2.0  --   --   PROT 6.7  --   --   --  7.1  --   --   ALBUMIN 3.0*  --   --   --  3.0*  --   --   AST 12*  --   --   --  16  --   --   ALT 7  --   --   --  9  --   --   ALKPHOS 80  --   --   --  83  --   --   BILITOT 0.5  --   --   --  0.6  --   --   GFRNONAA >60  --   --   --  >60 >60 >60  ANIONGAP 10  --   --   --  '10 8 8   '$ < > = values in this interval not displayed.    Lipids No results for input(s): "CHOL", "TRIG", "HDL", "LABVLDL", "LDLCALC", "CHOLHDL" in the last 168 hours.  Hematology Recent Labs  Lab 09/18/22 0418 09/19/22 0038 09/20/22 0052  WBC 7.6 7.2 8.3  RBC 3.21* 3.06* 3.20*  HGB 8.6* 7.9* 8.3*  HCT 29.1* 27.9* 29.3*  MCV 90.7 91.2 91.6  MCH 26.8 25.8* 25.9*  MCHC 29.6* 28.3* 28.3*  RDW 15.8* 15.9* 15.7*  PLT 229 260 254   Thyroid  Recent Labs  Lab 09/17/22 1705  TSH 3.200    BNP Recent Labs  Lab 09/17/22 1834  BNP 143.7*    DDimer No results for input(s): "DDIMER" in the last 168 hours.   Radiology    No results found.  Cardiac Studies   EF 65%.   Patient Profile     56 y.o. female  hx of chronic COPD who is being seen 09/18/2022 for the  evaluation of shortness of breath with moderate aortic stenosis at the request of Dr. Waldron Labs   Assessment & Plan     Moderate aortic stenosis - At this point, does not require replacement.  We will continue to monitor as outpatient.  Not severe.  Stable   Acute on chronic respiratory failure - Find to be treated as both chronic diastolic heart failure as well as COPD exacerbation.  I am comfortable with her receiving IV 80 mg Lasix twice a day, monitoring her BUN and creatinine closely.  4 L out yesterday.  Creatinine stable at 0.7.  She is also getting nebulizers Solu-Medrol continuous oxygen.  Magnesium is being repleted.  Lets continue with diuresis.   Paroxysmal atrial fibrillation and flutter - Previous office note from 2022 referred to from Dr. Geraldo Pitter.  Apparently she had been on high-dose amiodarone as a young lady.  Ablation had been recommended to be set up at [facility] at some point.  In review from care everywhere on 10/14 as well as 07/1819 22 she was called multiple times to try to schedule ablation but did not go through this.  Currently in sinus rhythm.  No need for further evaluation.  Previous GI bleed - On apixaban.  Hemoglobin this morning mildly improved from 7.9 back up to 8.3..  On admit 8.6.  Previously 10.2.  Holding Eliquis.  No frank melena.  IV iron was administered.  Morbid obesity hypoventilation syndrome - Bicarb 43 indicative of hypoventilation syndrome.  Continue with weight loss strategy.     For  questions or updates, please contact [place] Please consult [email or web address] for contact info under        Signed, Candee Furbish, MD  09/20/2022, 10:47 AM

## 2022-09-20 NOTE — TOC Initial Note (Addendum)
Transition of Care [facility]) - Initial/Assessment Note    Patient Details  Name: Chelsea [name] MRN: [id number] Date of Birth: 1965/10/17  Transition of Care [facility]) CM/SW Contact:    Bethann Berkshire, [place] Phone Number: 09/20/2022, 2:53 PM  Clinical Narrative:                  CSW met with pt and confirmed she is from [facility] and plans to return there at [place] is familiar with this pt and she has been at [facility] for STR for the past few months. She does have medicare and [employer].   CSW will confirm with SNF that pt can return and clarify if she is there under STR or LTC benefits. Awaiting response from [facility]. Will complete fl2 and fax to [facility] in hub. [facility] liaison informed pt is there for STR and can return at [place]: CSW is informed that [facility] may not be able to accept pt back as pt did not do a "bed hold." They may have a bed over the weekend.  Planned Disposition: Skilled Nursing Facility Barriers to Discharge: Continued Medical Work up   Patient Goals and CMS Choice            Expected Discharge Plan and Services Planned Disposition: [place] arrangements for the past 2 months: [place]                                      Prior Living Arrangements/Services Living arrangements for the past 2 months: [place] Lives with:: Facility Resident                   Activities of Daily Living      Permission Sought/Granted                  Emotional Assessment Appearance:: Appears stated age Attitude/Demeanor/Rapport: Engaged Affect (typically observed): Accepting Orientation: : Oriented to Self, Oriented to Place, Oriented to  Time, Oriented to Situation Alcohol / Substance Use: Not Applicable Psych Involvement: No (comment)  Admission diagnosis:  Shortness of breath [R06.02] COPD exacerbation (HCC) [J44.1] Acute on chronic diastolic congestive heart failure  (HCC) [I50.33] Acute on chronic respiratory failure with hypoxia (HCC) [J96.21] Acute cough [R05.1] Patient Active Problem List   Diagnosis Date Noted   Chronic iron deficiency anemia 09/18/2022   Acute on chronic respiratory failure with hypoxia ([place]) 09/17/2022   Chronic disease anemia 08/01/2022   CHF exacerbation ([place]) 07/30/2022   CHF (congestive heart failure) ([place]) 07/30/2022   Upper GI bleed 07/18/2022   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   Acute blood loss anemia 07/18/2022   Acute on chronic hypoxic respiratory failure ([place]) 07/04/2022   Bleeding of the respiratory tract 04/07/2022   Acute on chronic respiratory failure with hypoxia and hypercapnia ([place]) 04/07/2022   Pulmonary edema, acute ([place]) 02/21/2022   Restrictive lung disease secondary to obesity 01/28/2022   Symptomatic anemia 01/04/2022   Limitation of activity due to disability 09/18/2021   Paroxysmal atrial fibrillation ([place]) 09/04/2021   DJD (degenerative joint disease) 07/03/2021   Venous insufficiency of both lower extremities 07/02/2021   Physical deconditioning 04/24/2021   Depression 04/20/2021   History of tobacco use 04/20/2021   Hyperglycemia due to type 2 diabetes mellitus ([place]) 04/20/2021   Irritable  bowel syndrome with constipation 04/20/2021   Microalbuminuria 04/20/2021   On supplemental oxygen by nasal cannula 04/20/2021   Renal insufficiency 04/20/2021   Heart failure ([place]) 04/20/2021   Moderate aortic stenosis 04/20/2021   Mild tricuspid regurgitation 04/02/2021   Mitral valve sclerosis 04/02/2021   Morbid obesity with BMI of 50.0-59.9, adult ([place]) 04/02/2021   Non-smoker 04/02/2021   Awaiting admission to adequate facility elsewhere 04/88/8916   Metabolic alkalosis 94/50/3888   Restless leg syndrome 03/22/2021   Weakness 03/20/2021   Acute congestive heart failure ([place]) 03/18/2021   Hx of atrial flutter 03/15/2021   Weight loss 03/15/2021    Atrial flutter ([place]) 03/09/2021   S/P hysterectomy 02/19/2021   Pulmonary nodule less than 6 cm determined by computed tomography of lung 02/09/2021   Dyspnea on exertion 02/09/2021   Nocturnal hypoxemia 06/03/2020   Mixed hyperlipidemia 04/18/2020   Hair loss 09/17/2019   Chronic pain of multiple joints 06/18/2019   Irritable bowel syndrome with diarrhea 06/18/2019   Onychomycosis 06/18/2019   Acute idiopathic gout of left hand 02/16/2019   Tinea pedis of both feet 11/12/2018   Hypertension associated with diabetes ([place]) 08/25/2018   Class 3 severe obesity with serious comorbidity and body mass index (BMI) of 50.0 to 59.9 in adult ([place]) 08/25/2018   Class 3 severe obesity due to excess calories with serious comorbidity and body mass index (BMI) of 60.0 to 69.9 in adult ([place]) 08/25/2018   Chronic respiratory failure with hypoxia ([place]) 07/02/2018   Physical debility 05/10/2018   Hepatitis C    Neuropathy due to type 2 diabetes mellitus ([place]) 02/24/2018   Chronic heart failure with preserved ejection fraction ([place]) 02/23/2018   Dyspnea 12/05/2017   Acute on chronic diastolic CHF (congestive heart failure) ([place]) 09/25/2017   Pneumonia due to gram-positive bacteria 08/24/2017   Acute respiratory failure with hypoxia and hypercarbia ([place]) 05/27/2016   Hyperlipidemia 09/28/2015   COPD with acute exacerbation ([place]) 03/04/2015   Bronchitis 05/30/2011   Cellulitis, leg 04/29/2011   Neuropathic pain, leg 04/09/2011   Mononeuropathy of lower extremity 04/09/2011   Edema 03/22/2011   Morbid obesity ([place]) 03/22/2011   History of cervical cancer 03/22/2011   Lymphedema of both lower extremities 03/22/2011   Morbid (severe) obesity due to excess calories ([place]) 03/22/2011   Ankle pain, left 03/14/2011   Obesity hypoventilation syndrome ([place]) 01/01/2011   Osteoarthritis 11/28/2010   Chronic diastolic heart failure ([place]) 11/14/2010   Acquired hypothyroidism 11/13/2010   UNSPEC COMBINED  SYSTOLIC&DIASTOLIC HEART FAILURE 28/00/3491   HYPERTHYROIDISM 11/10/2010   DM 11/10/2010   Obstructive sleep apnea 11/10/2010   Essential hypertension 11/10/2010   GERD 11/10/2010   Gastro-esophageal reflux disease without esophagitis 11/10/2010   Essential (primary) hypertension 11/10/2010   Obstructive sleep apnea (adult) (pediatric) 11/10/2010   DM2 (diabetes mellitus, type 2) ([place]) 11/10/2010   PCP:  Pcp, No Pharmacy:   [facility], [place] [address] [place] [address] Phone: [phone] Fax: [phone]     Social Determinants of Health (SDOH) Social History: SDOH Screenings   Food Insecurity: No Food Insecurity (07/19/2022)  Housing: Low Risk  (07/19/2022)  Transportation Needs: No Transportation Needs (07/19/2022)  Utilities: Not At Risk (07/19/2022)  Alcohol Screen: Low Risk  (07/05/2022)  Financial Resource Strain: Low Risk  (07/05/2022)  Tobacco Use: Low Risk  (09/17/2022)   SDOH Interventions:     Readmission Risk Interventions    07/23/2022    2:19 PM 04/09/2022  4:17 PM  Readmission Risk Prevention Plan  Transportation Screening Complete Complete  PCP or Specialist Appt within 3-5 Days  Complete  HRI or Home Care Consult  Complete  Social Work Consult for [place] Planning/Counseling  Complete  Palliative Care Screening  Not Applicable  Medication Review Press photographer) Complete Referral to Pharmacy  PCP or Specialist appointment within 3-5 days of discharge Complete   HRI or [place] Complete   SW Recovery Care/Counseling Consult Complete   Palliative Care Screening Not [place] Complete

## 2022-09-21 DIAGNOSIS — J9621 Acute and chronic respiratory failure with hypoxia: Secondary | ICD-10-CM | POA: Diagnosis not present

## 2022-09-21 LAB — CBC
HCT: 30.9 % — ABNORMAL LOW (ref 36.0–46.0)
Hemoglobin: 9 g/dL — ABNORMAL LOW (ref 12.0–15.0)
MCH: 25.9 pg — ABNORMAL LOW (ref 26.0–34.0)
MCHC: 29.1 g/dL — ABNORMAL LOW (ref 30.0–36.0)
MCV: 89 fL (ref 80.0–100.0)
Platelets: 286 10*3/uL (ref 150–400)
RBC: 3.47 MIL/uL — ABNORMAL LOW (ref 3.87–5.11)
RDW: 15.5 % (ref 11.5–15.5)
WBC: 8.1 10*3/uL (ref 4.0–10.5)
nRBC: 0 % (ref 0.0–0.2)

## 2022-09-21 LAB — BASIC METABOLIC PANEL
Anion gap: 9 (ref 5–15)
BUN: 28 mg/dL — ABNORMAL HIGH (ref 6–20)
CO2: 43 mmol/L — ABNORMAL HIGH (ref 22–32)
Calcium: 9 mg/dL (ref 8.9–10.3)
Chloride: 88 mmol/L — ABNORMAL LOW (ref 98–111)
Creatinine, Ser: 0.85 mg/dL (ref 0.44–1.00)
GFR, Estimated: >60 mL/min (ref >60)
Glucose, Bld: 142 mg/dL — ABNORMAL HIGH (ref 70–99)
Potassium: 4 mmol/L (ref 3.5–5.1)
Sodium: 140 mmol/L (ref 135–145)

## 2022-09-21 LAB — GLUCOSE, CAPILLARY
Glucose-Capillary: 131 mg/dL — ABNORMAL HIGH (ref 70–99)
Glucose-Capillary: 134 mg/dL — ABNORMAL HIGH (ref 70–99)
Glucose-Capillary: 215 mg/dL — ABNORMAL HIGH (ref 70–99)
Glucose-Capillary: 210 mg/dL — ABNORMAL HIGH (ref 70–99)

## 2022-09-21 MED ORDER — GUAIFENESIN-DM 100-10 MG/5ML PO SYRP
5.0000 mL | ORAL_SOLUTION | ORAL | Status: DC | PRN
  Administered 2022-09-21 – 2022-09-26 (×6): 5 mL via ORAL
  Filled 2022-09-21 (×6): qty 5

## 2022-09-21 MED ORDER — LORATADINE 10 MG PO TABS
10.0000 mg | ORAL_TABLET | Freq: Every day | ORAL | Status: DC
  Administered 2022-09-21 – 2022-10-02 (×12): 10 mg via ORAL
  Filled 2022-09-21 (×12): qty 1

## 2022-09-21 NOTE — TOC CM/SW Note (Signed)
TOC CM spoke to [facility] rep, Kia to discuss transportation to MD appts. Pt has Medicaid transportation. Explained pt has been no show for appts. States she will discuss with clinical team to ensure pt is making appts. [place], Heart Failure TOC CM [phone]

## 2022-09-21 NOTE — Care Management Important Message (Signed)
Important Message  Patient Details  Name: Chelsea [name] MRN: [id number] Date of Birth: 17-May-1966   Medicare Important Message Given:  Yes     [name] 09/21/2022, 8:28 AM

## 2022-09-21 NOTE — Plan of Care (Signed)
Pt complaining of nasal congestion; clairitin ordered. Pt titrated to 8L from 5L HFNC due to O2 sats in the mids 80s.  Problem: Education: Goal: Ability to describe self-care measures that may prevent or decrease complications (Diabetes Survival Skills Education) will improve Outcome: Progressing Goal: Individualized Educational Video(s) Outcome: Progressing   Problem: Coping: Goal: Ability to adjust to condition or change in health will improve Outcome: Progressing   Problem: Fluid Volume: Goal: Ability to maintain a balanced intake and output will improve Outcome: Progressing   Problem: Health Behavior/Discharge Planning: Goal: Ability to identify and utilize available resources and services will improve Outcome: Progressing Goal: Ability to manage health-related needs will improve Outcome: Progressing   Problem: Metabolic: Goal: Ability to maintain appropriate glucose levels will improve Outcome: Progressing   Problem: Nutritional: Goal: Maintenance of adequate nutrition will improve Outcome: Progressing Goal: Progress toward achieving an optimal weight will improve Outcome: Progressing   Problem: Skin Integrity: Goal: Risk for impaired skin integrity will decrease Outcome: Progressing   Problem: Tissue Perfusion: Goal: Adequacy of tissue perfusion will improve Outcome: Progressing   Problem: Education: Goal: Knowledge of General Education information will improve Description: Including pain rating scale, medication(s)/side effects and non-pharmacologic comfort measures Outcome: Progressing   Problem: Health Behavior/Discharge Planning: Goal: Ability to manage health-related needs will improve Outcome: Progressing   Problem: Clinical Measurements: Goal: Ability to maintain clinical measurements within normal limits will improve Outcome: Progressing Goal: Will remain free from infection Outcome: Progressing Goal: Diagnostic test results will improve Outcome:  Progressing Goal: Respiratory complications will improve Outcome: Progressing Goal: Cardiovascular complication will be avoided Outcome: Progressing   Problem: Activity: Goal: Risk for activity intolerance will decrease Outcome: Progressing   Problem: Nutrition: Goal: Adequate nutrition will be maintained Outcome: Progressing   Problem: Coping: Goal: Level of anxiety will decrease Outcome: Progressing   Problem: Elimination: Goal: Will not experience complications related to bowel motility Outcome: Progressing Goal: Will not experience complications related to urinary retention Outcome: Progressing   Problem: Pain Managment: Goal: General experience of comfort will improve Outcome: Progressing   Problem: Safety: Goal: Ability to remain free from injury will improve Outcome: Progressing   Problem: Skin Integrity: Goal: Risk for impaired skin integrity will decrease Outcome: Progressing

## 2022-09-21 NOTE — Progress Notes (Addendum)
Rounding Note    Patient Name: Chelsea [name] Date of Encounter: 09/21/2022  [facility] Cardiologist: None   Subjective   Fairly comfortable at this time.  Laying in bed.  Inpatient Medications    Scheduled Meds:  apixaban  5 mg Oral BID   azithromycin  500 mg Oral Q24H   budesonide  0.5 mg Inhalation BID   cyanocobalamin  1,000 mcg Oral Daily   empagliflozin  10 mg Oral Daily   ferrous sulfate  325 mg Oral Q breakfast   folic acid  1 mg Oral Daily   furosemide  80 mg Intravenous BID   gabapentin  400 mg Oral TID   insulin aspart  0-9 Units Subcutaneous TID WC   ipratropium-albuterol  3 mL Nebulization BID   levothyroxine  150 mcg Oral Q0600   LORazepam  0.5 mg Intravenous Once   pantoprazole  40 mg Oral Daily   predniSONE  30 mg Oral Q breakfast   senna-docusate  1 tablet Oral BID   Continuous Infusions:  sodium chloride 10 mL/hr at 09/19/22 1758   PRN Meds: sodium chloride, acetaminophen **OR** acetaminophen, albuterol, diclofenac Sodium, guaiFENesin-dextromethorphan, HYDROcodone-acetaminophen, loperamide, LORazepam, melatonin, Muscle Rub, polyethylene glycol   Vital Signs    Vitals:   09/21/22 0100 09/21/22 0407 09/21/22 0638 09/21/22 0738  BP: 101/70  100/72 116/79  Pulse: 68  66 77  Resp: '18  18 18  '$ Temp:   98.7 F (37.1 C) 97.8 F (36.6 C)  TempSrc:   Oral Oral  SpO2: 98% 96% 98% 91%  Weight:   (!) 145 kg   Height:        Intake/Output Summary (Last 24 hours) at 09/21/2022 0954 Last data filed at 09/21/2022 7829 Gross per 24 hour  Intake 950 ml  Output 3950 ml  Net -3000 ml      09/21/2022    6:38 AM 09/20/2022   12:32 AM 09/19/2022   11:58 PM  Last 3 Weights  Weight (lbs) 319 lb 10.7 oz 322 lb 15.6 oz 323 lb 6.6 oz  Weight (kg) 145 kg 146.5 kg 146.7 kg      Telemetry    No adverse arrhythmias- Personally Reviewed  ECG    Sinus rhythm 80s nonspecific ST-T wave changes- Personally Reviewed  Physical Exam    GEN: No acute distress.  Morbidly obese Neck: No JVD Cardiac: RRR, no change in aortic murmur 2/6,no rubs, or gallops.  Respiratory: Clear to auscultation bilaterally. GI: Soft, nontender, non-distended  MS: Chronic edema; No deformity. Neuro:  Nonfocal  Psych: Normal affect   Labs    High Sensitivity Troponin:   Recent Labs  Lab 09/17/22 1727 09/17/22 2119  TROPONINIHS 5 6     Chemistry Recent Labs  Lab 09/17/22 1705 09/17/22 1939 09/17/22 2119 09/18/22 0132 09/18/22 0418 09/19/22 0038 09/20/22 0052 09/21/22 0029  NA 139   < >  --    < > 138 138 139 140  K 4.0   < >  --    < > 4.2 4.2 4.0 4.0  CL 85*  --   --   --  86* 87* 88* 88*  CO2 44*  --   --   --  42* 43* 43* 43*  GLUCOSE 193*  --   --   --  259* 195* 162* 142*  BUN 17  --   --   --  21* 30* 30* 28*  CREATININE 0.80  --   --   --  0.76 0.73 0.77 0.85  CALCIUM 8.5*  --   --   --  8.6* 8.9 8.8* 9.0  MG  --   --  1.8  --  2.0  --   --   --   PROT 6.7  --   --   --  7.1  --   --   --   ALBUMIN 3.0*  --   --   --  3.0*  --   --   --   AST 12*  --   --   --  16  --   --   --   ALT 7  --   --   --  9  --   --   --   ALKPHOS 80  --   --   --  83  --   --   --   BILITOT 0.5  --   --   --  0.6  --   --   --   GFRNONAA >60  --   --   --  >60 >60 >60 >60  ANIONGAP 10  --   --   --  '10 8 8 9   '$ < > = values in this interval not displayed.    Lipids No results for input(s): "CHOL", "TRIG", "HDL", "LABVLDL", "LDLCALC", "CHOLHDL" in the last 168 hours.  Hematology Recent Labs  Lab 09/19/22 0038 09/20/22 0052 09/21/22 0029  WBC 7.2 8.3 8.1  RBC 3.06* 3.20* 3.47*  HGB 7.9* 8.3* 9.0*  HCT 27.9* 29.3* 30.9*  MCV 91.2 91.6 89.0  MCH 25.8* 25.9* 25.9*  MCHC 28.3* 28.3* 29.1*  RDW 15.9* 15.7* 15.5  PLT 260 254 286   Thyroid  Recent Labs  Lab 09/17/22 1705  TSH 3.200    BNP Recent Labs  Lab 09/17/22 1834  BNP 143.7*    DDimer No results for input(s): "DDIMER" in the last 168 hours.   Radiology    No  results found.  Cardiac Studies   EF 65%.   Patient Profile     56 y.o. female  hx of chronic COPD who is being seen 09/18/2022 for the evaluation of shortness of breath with moderate aortic stenosis at the request of Dr. Waldron Labs   Assessment & Plan     Moderate aortic stenosis - At this point, does not require replacement.  We will continue to monitor as outpatient.  Not severe.  Stable   Acute on chronic respiratory failure - Find to be treated as both chronic diastolic heart failure as well as COPD exacerbation.   IV 80 mg Lasix twice a day, monitoring her BUN and creatinine closely.  4 L out again yesterday.  Creatinine stable at 0.8.  She is also getting nebulizers Solu-Medrol continuous oxygen.  Magnesium is being repleted.  Lets continue with diuresis. On Jardiance 10 mg.   Paroxysmal atrial fibrillation and flutter - Previous office note from 2022 referred to from Dr. Geraldo Pitter.  Apparently she had been on high-dose amiodarone as a young lady.  Ablation had been recommended to be set up at [facility] at some point.  In review from care everywhere on 10/14 as well as 07/1819 22 she was called multiple times to try to schedule ablation but did not go through this.  Currently in sinus rhythm.  No need for further evaluation.  Previous GI bleed - On apixaban.  Hemoglobin this morning mildly improved from 7.9 back up to 9.0.  On  admit 8.6.  Previously 10.2. Back on Eliquis.  No frank melena.  IV iron was administered.  Morbid obesity hypoventilation syndrome - Bicarb 43 indicative of hypoventilation syndrome.  Continue with weight loss strategy.   Discussed with Dr. Broadus John.    For questions or updates, please contact [place] Please consult [email or web address] for contact info under        Signed, Candee Furbish, MD  09/21/2022, 9:54 AM

## 2022-09-21 NOTE — Progress Notes (Signed)
PROGRESS NOTE    Chelsea [name]  PNT:[id number] DOB: September 21, 1966 DOA: 09/17/2022 PCP: Pcp, No   Chelsea [name] is a 56 y.o. F with asthma/COPD, dCHF and OHS and hx tracheostomy now changed, MO with BMI 54, and chronic respiratory failure on 4L home O2, anemia, AF on eliquis, DM, HTN lives in ALF, who presented with dyspnea. 12/18: Started on steroids, diuresis 12/19: Cardiology consulted Worsening anemia, Iv Fe given  Subjective: Feels a little better overall, still congested, coughing up yellow phlegm, on 6 L O2 this morning   Assessment and Plan:  Acute on chronic respiratory failure with hypoxia (HCC) Presented with dyspnea and hypoxia,   Due to CHF, COPD. -Continue to wean O2, on 3 L at baseline  Acute on chronic diastolic CHF -ECHO 08/67, EF 60-65%, grade 2DD, RV normal, mod AS -Continue IV Lasix, dose increased, she is 6.1 L negative, creatinine stable -Started Jardiance  COPD with acute exacerbation (HCC) - Continue prednisone -, DuoNebs, azithromycin -  pantoprazole, add Claritin -Increase activity, PT eval  Acute on Chronic iron deficiency anemia -recently admitted for symptomatic anemia in Oct 2023, had transfusion, endoscopy by [facility] without findings, but notes state "presumed upper GI bleed". - Hemoglobin dropped to the 7 range, Eliquis held for 48 hours, iron stores low, given IV iron -Hemoglobin improving, Eliquis resumed, hemoglobin improving, continue PPI -CBC in a.m.  Class 3 severe obesity due to excess calories with serious comorbidity and body mass index (BMI) of 60.0 to 69.9 in adult (HCC) BMI 54  Paroxysmal atrial fibrillation (HCC) Rate controlled, CHA2DS2-Vasc only 3. - Hemoglobin stabilizing, resumed Eliquis  Moderate aortic stenosis Not active  DM2 (diabetes mellitus, type 2) (HCC) -CBGs improving - Continue SS corrections  Lymphedema of both lower extremities Chronic venous stasis change noted.  Depression - Continue  Ativan  Obesity hypoventilation syndrome ([place]) - CPAP at night  Acquired hypothyroidism - Continue levothyroxine  GERD - Continue PPI  Essential hypertension Blood pressure soft - Continue furosemide   Obstructive sleep apnea - CPAP at night   DVT prophylaxis: Eliquis Code Status: Full Code Family Communication: None present Disposition Plan: SNF likely 48 hours  Consultants:    Procedures:   Antimicrobials:    Objective: Vitals:   09/21/22 0638 09/21/22 0738 09/21/22 1100 09/21/22 1211  BP: 100/72 116/79    Pulse: 66 77  80  Resp: '18 18  18  '$ Temp: 98.7 F (37.1 C) 97.8 F (36.6 C)    TempSrc: Oral Oral    SpO2: 98% 91% (!) 84% 93%  Weight: (!) 145 kg     Height:        Intake/Output Summary (Last 24 hours) at 09/21/2022 1232 Last data filed at 09/21/2022 6195 Gross per 24 hour  Intake 600 ml  Output 3150 ml  Net -2550 ml   Filed Weights   09/19/22 2358 09/20/22 0032 09/21/22 0638  Weight: (!) 146.7 kg (!) 146.5 kg (!) 145 kg    Examination:    Data Reviewed:   CBC: Recent Labs  Lab 09/17/22 1705 09/17/22 1939 09/18/22 0132 09/18/22 0418 09/19/22 0038 09/20/22 0052 09/21/22 0029  WBC 6.0  --   --  7.6 7.2 8.3 8.1  NEUTROABS 3.9  --   --  7.0  --   --   --   HGB 8.4*   < > 10.2* 8.6* 7.9* 8.3* 9.0*  HCT 29.3*   < > 30.0* 29.1* 27.9* 29.3* 30.9*  MCV 91.6  --   --  90.7 91.2 91.6 89.0  PLT 236  --   --  229 260 254 286   < > = values in this interval not displayed.   Basic Metabolic Panel: Recent Labs  Lab 09/17/22 1705 09/17/22 1939 09/17/22 2119 09/18/22 0132 09/18/22 0418 09/19/22 0038 09/20/22 0052 09/21/22 0029  NA 139   < >  --  137 138 138 139 140  K 4.0   < >  --  4.7 4.2 4.2 4.0 4.0  CL 85*  --   --   --  86* 87* 88* 88*  CO2 44*  --   --   --  42* 43* 43* 43*  GLUCOSE 193*  --   --   --  259* 195* 162* 142*  BUN 17  --   --   --  21* 30* 30* 28*  CREATININE 0.80  --   --   --  0.76 0.73 0.77 0.85  CALCIUM  8.5*  --   --   --  8.6* 8.9 8.8* 9.0  MG  --   --  1.8  --  2.0  --   --   --   PHOS  --   --   --   --  5.3*  --   --   --    < > = values in this interval not displayed.   GFR: Estimated Creatinine Clearance: 105 mL/min (by C-G formula based on SCr of 0.85 mg/dL). Liver Function Tests: Recent Labs  Lab 09/17/22 1705 09/18/22 0418  AST 12* 16  ALT 7 9  ALKPHOS 80 83  BILITOT 0.5 0.6  PROT 6.7 7.1  ALBUMIN 3.0* 3.0*   No results for input(s): "LIPASE", "AMYLASE" in the last 168 hours. No results for input(s): "AMMONIA" in the last 168 hours. Coagulation Profile: No results for input(s): "INR", "PROTIME" in the last 168 hours. Cardiac Enzymes: No results for input(s): "CKTOTAL", "CKMB", "CKMBINDEX", "TROPONINI" in the last 168 hours. BNP (last 3 results) No results for input(s): "PROBNP" in the last 8760 hours. HbA1C: No results for input(s): "HGBA1C" in the last 72 hours. CBG: Recent Labs  Lab 09/20/22 1124 09/20/22 1623 09/20/22 2118 09/21/22 0643 09/21/22 1117  GLUCAP 178* 276* 183* 131* 134*   Lipid Profile: No results for input(s): "CHOL", "HDL", "LDLCALC", "TRIG", "CHOLHDL", "LDLDIRECT" in the last 72 hours. Thyroid Function Tests: No results for input(s): "TSH", "T4TOTAL", "FREET4", "T3FREE", "THYROIDAB" in the last 72 hours.  Anemia Panel: No results for input(s): "VITAMINB12", "FOLATE", "FERRITIN", "TIBC", "IRON", "RETICCTPCT" in the last 72 hours.  Urine analysis:    Component Value Date/Time   COLORURINE STRAW (A) 07/04/2022 2300   APPEARANCEUR CLEAR 07/04/2022 2300   LABSPEC 1.008 07/04/2022 2300   PHURINE 6.0 07/04/2022 2300   GLUCOSEU NEGATIVE 07/04/2022 2300   HGBUR NEGATIVE 07/04/2022 2300   BILIRUBINUR NEGATIVE 07/04/2022 2300   KETONESUR NEGATIVE 07/04/2022 2300   PROTEINUR NEGATIVE 07/04/2022 2300   UROBILINOGEN 0.2 12/08/2010 0545   NITRITE NEGATIVE 07/04/2022 2300   LEUKOCYTESUR NEGATIVE 07/04/2022 2300   Sepsis  Labs: '@LABRCNTIP'$ (procalcitonin:4,lacticidven:4)  ) Recent Results (from the past 240 hour(s))  Resp panel by RT-PCR (RSV, Flu A&B, Covid) Anterior Nasal Swab     Status: None   Collection Time: 09/17/22  5:05 PM   Specimen: Anterior Nasal Swab  Result Value Ref Range Status   SARS Coronavirus 2 by RT PCR NEGATIVE NEGATIVE Final    Comment: (NOTE) SARS-CoV-2 target nucleic acids are NOT DETECTED.  The SARS-CoV-2 RNA is generally detectable in upper respiratory specimens during the acute phase of infection. The lowest concentration of SARS-CoV-2 viral copies this assay can detect is 138 copies/mL. A negative result does not preclude SARS-Cov-2 infection and should not be used as the sole basis for treatment or other patient management decisions. A negative result may occur with  improper specimen collection/handling, submission of specimen other than nasopharyngeal swab, presence of viral mutation(s) within the areas targeted by this assay, and inadequate number of viral copies(<138 copies/mL). A negative result must be combined with clinical observations, patient history, and epidemiological information. The expected result is Negative.  Fact Sheet for Patients:  EntrepreneurPulse.com.au  Fact Sheet for Healthcare Providers:  IncredibleEmployment.be  This test is no t yet approved or cleared by the [place] FDA and  has been authorized for detection and/or diagnosis of SARS-CoV-2 by FDA under an Emergency Use Authorization (EUA). This EUA will remain  in effect (meaning this test can be used) for the duration of the COVID-19 declaration under Section 564(b)(1) of the Act, 21 U.S.C.section 360bbb-3(b)(1), unless the authorization is terminated  or revoked sooner.       Influenza A by PCR NEGATIVE NEGATIVE Final   Influenza B by PCR NEGATIVE NEGATIVE Final    Comment: (NOTE) The Xpert Xpress SARS-CoV-2/FLU/RSV plus assay is intended as  an aid in the diagnosis of influenza from Nasopharyngeal swab specimens and should not be used as a sole basis for treatment. Nasal washings and aspirates are unacceptable for Xpert Xpress SARS-CoV-2/FLU/RSV testing.  Fact Sheet for Patients: EntrepreneurPulse.com.au  Fact Sheet for Healthcare Providers: IncredibleEmployment.be  This test is not yet approved or cleared by the [place] FDA and has been authorized for detection and/or diagnosis of SARS-CoV-2 by FDA under an Emergency Use Authorization (EUA). This EUA will remain in effect (meaning this test can be used) for the duration of the COVID-19 declaration under Section 564(b)(1) of the Act, 21 U.S.C. section 360bbb-3(b)(1), unless the authorization is terminated or revoked.     Resp Syncytial Virus by PCR NEGATIVE NEGATIVE Final    Comment: (NOTE) Fact Sheet for Patients: EntrepreneurPulse.com.au  Fact Sheet for Healthcare Providers: IncredibleEmployment.be  This test is not yet approved or cleared by the [place] FDA and has been authorized for detection and/or diagnosis of SARS-CoV-2 by FDA under an Emergency Use Authorization (EUA). This EUA will remain in effect (meaning this test can be used) for the duration of the COVID-19 declaration under Section 564(b)(1) of the Act, 21 U.S.C. section 360bbb-3(b)(1), unless the authorization is terminated or revoked.  Performed at [facility], [place] [address] [place],  [address]      Radiology Studies: No results found.   Scheduled Meds:  apixaban  5 mg Oral BID   azithromycin  500 mg Oral Q24H   budesonide  0.5 mg Inhalation BID   cyanocobalamin  1,000 mcg Oral Daily   empagliflozin  10 mg Oral Daily   ferrous sulfate  325 mg Oral Q breakfast   folic acid  1 mg Oral Daily   furosemide  80 mg Intravenous BID   gabapentin  400 mg Oral TID   insulin aspart  0-9 Units  Subcutaneous TID WC   ipratropium-albuterol  3 mL Nebulization BID   levothyroxine  150 mcg Oral Q0600   loratadine  10 mg Oral Daily   LORazepam  0.5 mg Intravenous Once   pantoprazole  40 mg Oral Daily   predniSONE  30 mg Oral Q breakfast   senna-docusate  1 tablet Oral BID   Continuous Infusions:  sodium chloride 10 mL/hr at 09/19/22 1758     LOS: 4 days    Time spent: 42mn    PDomenic Polite MD [facility]   09/21/2022, 12:32 PM

## 2022-09-21 NOTE — TOC Progression Note (Incomplete)
Transition of Care [facility]) - Progression Note    Patient Details  Name: KEIGAN GIRTEN MRN: [id number] Date of Birth: 1966-07-11  Transition of Care [facility]) CM/SW [place], [place] Phone Number: 09/21/2022, 3:34 PM  Clinical Narrative:     CSW met with pt and explained that [facility] said that pt did not do a "bed hold" and may not have    Barriers to Discharge: Continued Medical Work up  Expected Discharge Plan and [place] arrangements for the past 2 months: [place] Determinants of Health (SDOH) Interventions [place]: No Food Insecurity (07/19/2022)  Housing: Low Risk  (07/19/2022)  Transportation Needs: No Transportation Needs (07/19/2022)  Utilities: Not At Risk (07/19/2022)  Alcohol Screen: Low Risk  (07/05/2022)  Financial Resource Strain: Low Risk  (07/05/2022)  Tobacco Use: Low Risk  (09/17/2022)    Readmission Risk Interventions    07/23/2022    2:19 PM 04/09/2022    4:17 PM  Readmission Risk Prevention Plan  Transportation Screening Complete Complete  PCP or Specialist Appt within 3-5 Days  Complete  HRI or [place]  Complete  Social Work Consult for [place] Planning/Counseling  [place]  Not Applicable  Medication Review Press photographer) Complete Referral to Pharmacy  PCP or Specialist appointment within 3-5 days of discharge Complete   HRI or [place] Complete   SW Recovery Care/Counseling Consult Complete   Palliative Care Screening Not [place] Complete

## 2022-09-21 NOTE — Progress Notes (Signed)
   09/21/22 0043  Respiratory Assessment  Assessment Type Assess only  Respiratory Pattern Regular  Chest Assessment Chest expansion symmetrical  Cough None  Bilateral Breath Sounds Diminished  Oxygen Therapy/Pulse Ox  O2 Device HFNC  O2 Flow Rate (L/min) 7 L/min  SpO2 92 %   Pt refusing Cpap for QHS at this time. Pt states she is coughing up clear secretions and is concerned that the mask will affect her breathing. Pt stable on 7L HFNC will continue to monitor for any additional changes

## 2022-09-21 NOTE — Progress Notes (Signed)
Heart Failure Nurse Navigator Progress Note  PCP: Pcp, No PCP-Cardiologist: None Admission Diagnosis: None Admitted from: rehab via EMS  Presentation:   Chelsea [name] presented with  shortness of breath, increased oxygen requirement, patient currently wears 5L oxygen at Rehab,2+  bilateral  lower extremity edema and a 20 pound weight gain. Patient has recently been treated for PNA for the last 5 days with antibiotics. Patient also reports a right knee injury from her facility when she was being moved with a hoyer lift. CXR showed increased central vascular congestion consistent with CHF. BNP 363, BMI 54, BP 113/78, HR 73,    Patient was educated on the sign and symptoms of heart failure, daily weights (states her rehab center has only weighed her a handful of times since being there. ) Education done on diet/ fluid retrictions, taking all her medications as prescribed and' \\attending'$  all doctor ppointments. Patient stated her rehab center could transport her and she would use a wheel chair due to current mobility issues for which she is at rehab for.   ECHO/ LVEF: 60-65%  Clinical Course:  Past Medical History:  Diagnosis Date   Aortic stenosis    Echocardiogram 02/21/11:  Mean gradient 23 mm of mercury; peak gradient 36;; Turbulence noted in the area of the aortic isthmus with peak gradient 23 mmHg-consider MRI to assess for coarctation;    TEE: 04/02/11:  EF 55-60%, mod BAE, trileaflet AV with mild AS (pk and mean 25 and 16), mild AI, mild MR, mild TR, atrial septal aneurysm, no evidence of corarctation   Arthritis    "qwhere" (12/05/2017)   Asthma    Cervical cancer ([place]) 2006   CHF (congestive heart failure) (HCC)    Chronic lower back pain    Complication of anesthesia    "I have a hard time waking up from under it" (12/05/2017)   COPD (chronic obstructive pulmonary disease) ([place])    "lung dr said I don't have this" (12/05/2017)   Coronary artery disease    Diastolic congestive heart  failure ([place])    Echo 02/21/11: EF 55-60%, moderate LVH, grade 2 diastolic dysfunction, mild to moderate aortic stenosis with a mean gradient 23 mm of mercury   GERD (gastroesophageal reflux disease)    Heart murmur    Hepatitis C    History of gout X 1   Hyperlipidemia    Hypertension    Hypotension arterial 04/07/2022   Hypothyroid    IBS (irritable bowel syndrome)    Migraine    "monthly" (12/05/2017)   Obesity hypoventilation syndrome ([place])    On home oxygen therapy    "2L all the time" (12/05/2017)   OSA treated with BiPAP    "have CPAP at home too; wearing BiPAP right now" (12/05/2017)   Pneumonia    "several times" (12/05/2017)   Type II diabetes mellitus ([place])      Social History   Socioeconomic History   Marital status: Significant Other    Spouse name: Not on file   Number of children: Not on file   Years of education: Not on file   Highest education level: Not on file  Occupational History   Occupation: disabled  Tobacco Use   Smoking status: Never   Smokeless tobacco: Never  Vaping Use   Vaping Use: Never used  Substance and Sexual Activity   Alcohol use: Not Currently   Drug use: No   Sexual activity: Not Currently  Other Topics Concern   Not on file  Social History Narrative   Not on file   Social Determinants of Health   Financial Resource Strain: Low Risk  (07/05/2022)   Overall Financial Resource Strain (CARDIA)    Difficulty of Paying Living Expenses: Not very hard  Food Insecurity: No Food Insecurity (07/19/2022)   Hunger Vital Sign    Worried About Running Out of Food in the Last Year: Never true    Ran Out of Food in the Last Year: Never true  Transportation Needs: No Transportation Needs (07/19/2022)   PRAPARE - Hydrologist (Medical): No    Lack of Transportation (Non-Medical): No  Physical Activity: Not on file  Stress: Not on file  Social Connections: Not on file    High Risk Criteria for Readmission and/or Poor  Patient Outcomes: Heart failure hospital admissions (last 6 months): 0  No Show rate: 7% Difficult social situation: No Demonstrates medication adherence: Yes Primary Language: English Literacy level: Reading, writing, and comprehension  Barriers of Care:   Daily weights Diet/ fluid restrictions  Considerations/Referrals:   Referral made to Heart Failure Pharmacist Stewardship: Yes Referral made to Heart Failure CSW/NCM TOC: No Referral made to Heart & Vascular TOC clinic: Yes, 10/09/22  Items for Follow-up on DC/TOC: Diet/ fluid/ daily weights compliance   Earnestine Leys, BSN, RN Heart Failure Transport planner Only

## 2022-09-22 ENCOUNTER — Inpatient Hospital Stay (HOSPITAL_COMMUNITY): Payer: Medicare Other

## 2022-09-22 DIAGNOSIS — J9621 Acute and chronic respiratory failure with hypoxia: Secondary | ICD-10-CM | POA: Diagnosis not present

## 2022-09-22 DIAGNOSIS — I5033 Acute on chronic diastolic (congestive) heart failure: Secondary | ICD-10-CM | POA: Diagnosis not present

## 2022-09-22 LAB — BASIC METABOLIC PANEL
Anion gap: 17 — ABNORMAL HIGH (ref 5–15)
Anion gap: 13 (ref 5–15)
BUN: 26 mg/dL — ABNORMAL HIGH (ref 6–20)
BUN: 24 mg/dL — ABNORMAL HIGH (ref 6–20)
CO2: 38 mmol/L — ABNORMAL HIGH (ref 22–32)
CO2: 42 mmol/L — ABNORMAL HIGH (ref 22–32)
Calcium: 8.6 mg/dL — ABNORMAL LOW (ref 8.9–10.3)
Calcium: 9 mg/dL (ref 8.9–10.3)
Chloride: 86 mmol/L — ABNORMAL LOW (ref 98–111)
Chloride: 84 mmol/L — ABNORMAL LOW (ref 98–111)
Creatinine, Ser: 0.8 mg/dL (ref 0.44–1.00)
Creatinine, Ser: 0.79 mg/dL (ref 0.44–1.00)
GFR, Estimated: >60 mL/min (ref >60)
GFR, Estimated: >60 mL/min (ref >60)
Glucose, Bld: 125 mg/dL — ABNORMAL HIGH (ref 70–99)
Glucose, Bld: 165 mg/dL — ABNORMAL HIGH (ref 70–99)
Potassium: 3.7 mmol/L (ref 3.5–5.1)
Potassium: 3.7 mmol/L (ref 3.5–5.1)
Sodium: 141 mmol/L (ref 135–145)
Sodium: 139 mmol/L (ref 135–145)

## 2022-09-22 LAB — GLUCOSE, CAPILLARY
Glucose-Capillary: 94 mg/dL (ref 70–99)
Glucose-Capillary: 180 mg/dL — ABNORMAL HIGH (ref 70–99)
Glucose-Capillary: 292 mg/dL — ABNORMAL HIGH (ref 70–99)
Glucose-Capillary: 318 mg/dL — ABNORMAL HIGH (ref 70–99)

## 2022-09-22 LAB — BETA-HYDROXYBUTYRIC ACID: Beta-Hydroxybutyric Acid: 0.1 mmol/L (ref 0.05–0.27)

## 2022-09-22 LAB — LACTIC ACID, PLASMA: Lactic Acid, Venous: 2.7 mmol/L (ref 0.5–1.9)

## 2022-09-22 MED ORDER — SPIRONOLACTONE 12.5 MG HALF TABLET
12.5000 mg | ORAL_TABLET | Freq: Every day | ORAL | Status: DC
  Administered 2022-09-22 – 2022-10-02 (×11): 12.5 mg via ORAL
  Filled 2022-09-22 (×11): qty 1

## 2022-09-22 MED ORDER — DEXTROMETHORPHAN POLISTIREX ER 30 MG/5ML PO SUER
30.0000 mg | Freq: Two times a day (BID) | ORAL | Status: DC
  Administered 2022-09-22 – 2022-10-01 (×13): 30 mg via ORAL
  Filled 2022-09-22 (×24): qty 5

## 2022-09-22 MED ORDER — DOXYCYCLINE HYCLATE 100 MG PO TABS
100.0000 mg | ORAL_TABLET | Freq: Two times a day (BID) | ORAL | Status: AC
  Administered 2022-09-22 – 2022-09-27 (×10): 100 mg via ORAL
  Filled 2022-09-22 (×10): qty 1

## 2022-09-22 MED ORDER — SODIUM CHLORIDE 0.9 % IV SOLN
100.0000 mg | Freq: Once | INTRAVENOUS | Status: AC
  Administered 2022-09-22: 100 mg via INTRAVENOUS
  Filled 2022-09-22: qty 100

## 2022-09-22 NOTE — Progress Notes (Signed)
   09/22/22 1455  BiPAP/CPAP/SIPAP  Mask Type Full face mask  Mask Size Medium  IPAP 18 cmH20  EPAP 10 cmH2O  Flow Rate 12 lpm (Bled in)   Pt placed on BiPAP on above settings. Pt is currently stable at this time.

## 2022-09-22 NOTE — Progress Notes (Signed)
[facility] Progress Note Patient: Chelsea [name] TDV:[id number] DOB: [birth year] DOA: 09/17/2022  DOS: the patient was seen and examined on 09/22/2022  Brief hospital course: Chelsea [name] is a 56 y.o. F with asthma/COPD, dCHF and OHS and hx tracheostomy now changed, MO with BMI 54, and chronic respiratory failure on 3L home O2, anemia, AF on eliquis, DM, HTN lives in ALF, who presented with .. dyspnea. 12/18: Started on steroids, diuresis 12/19: Cardiology consulted 12/23.  Hypoxia worsening.  Started on continuous BiPAP.  Patient has been refusing BiPAP therapy in the hospital. Assessment and Plan: Acute on chronic hypoxic and hypercarbic respiratory failure COPD with acute exacerbation. Obesity hypoventilation syndrome. Patient is chronically on 4 L of oxygen (not 3). Patient is on BiPAP at home but refusing that therapy here in the hospital. Presents with complaints of shortness of breath and treated with IV steroids for COPD exacerbation with azithromycin and IV diuresis. Currently due to worsening hypoxia I recommended patient to transition to BiPAP therapy on a continuous basis with breaks every few hours as tolerated. On oral prednisone which I will continue.  Continue DuoNebs. Will monitor for now.  Acute on chronic diastolic CHF. Echocardiogram in October shows preserved EF and grade 2 diastolic and moderate aortic stenosis. Currently on IV Lasix. Will continue.  Possible cellulitis. Patient does have some redness and warmth involving her left upper extremity as well as right lower extremity. Also appears febrile in nature. Will initiate doxycycline for cellulitis therapy. Patient does have history of penicillin and therefore limited options for strep coverage for now.  Will add strep coverage if does not respond to doxycycline.  Moderate aortic stenosis. Monitor for now.  Paroxysmal A-fib. Currently rate controlled. Continue Eliquis.  Not on any rate control  medication.  Acute on chronic iron deficiency anemia. H&H relatively stable.  Monitor.  Transfuse for hemoglobin less than 7.  Diabetes mellitus, uncontrolled with hyperglycemia without long-term insulin use Hyperglycemia in the hospital most likely secondary to steroids. Recent hemoglobin A1c 5.2. Trending down from prior values which is excellent without any medication. For now we will monitor.  Morbid obesity. Body mass index is 53.05 kg/m.  Placing the patient at high risk of poor outcome.   Subjective: Continues to have shortness of breath.  Started to have some low-grade fevers.  Reports runny nose and cough.  No nausea no vomiting.  No chest pain.  No leg pain.  Physical Exam: General: in moderate distress;  Cardiovascular: S1 and S2 Present, aortic systolic  Murmur Respiratory: Increased respiratory effort, Bilateral Air entry present, bilateral crackles, bilateral wheezes Abdomen: Bowel Sound present, Non tender  Extremities: Bilateral edema chronic in nature with redness involving right leg and left upper extremity Neurology: Drowsy and oriented to time, place, and person, no focal deficit but has frequent myoclonic jerks at the time of my evaluation throughout the interview  Data Reviewed: I have Reviewed nursing notes, Vitals, and Lab results. Since last encounter, pertinent lab results CBC and BMP   . I have ordered test including CBC and BMP  . I have ordered imaging chest x-ray  .   Disposition: Status is: Inpatient Remains inpatient appropriate because: Need for IV diuresis and BiPAP therapy  SCDs Start: 09/17/22 2119 apixaban (ELIQUIS) tablet 5 mg   Family Communication: No one at bedside Level of care: Telemetry Cardiac Continue telemetry for CHF. Vitals:   09/22/22 1012 09/22/22 1347 09/22/22 1455 09/22/22 1739  BP: 112/60 (!) 105/58  (!) 109/56  Pulse:  100 86 84 83  Resp: '20 20 16 20  '$ Temp: 100.1 F (37.8 C) 100.1 F (37.8 C)  99 F (37.2 C)   TempSrc: Oral Oral  Oral  SpO2: 92% 95% 93% 95%  Weight:      Height:         Author: Berle Mull, MD 09/22/2022 6:38 PM  Please look on [email or web address] to find out who is on call.

## 2022-09-22 NOTE — Progress Notes (Signed)
Pt refusing Cpap at this time. Resting and stable on 8L HFNC. Pt states still coughing up secretions but will call respiratory if anything changes. No distress noted. Will continue to monitor

## 2022-09-22 NOTE — Plan of Care (Signed)

## 2022-09-22 NOTE — Progress Notes (Addendum)
Rounding Note    Patient Name: Chelsea [name] Date of Encounter: 09/22/2022  [facility] Cardiologist: New  Subjective   Ongoing SOB  Inpatient Medications    Scheduled Meds:  apixaban  5 mg Oral BID   budesonide  0.5 mg Inhalation BID   cyanocobalamin  1,000 mcg Oral Daily   dextromethorphan  30 mg Oral BID   ferrous sulfate  325 mg Oral Q breakfast   folic acid  1 mg Oral Daily   furosemide  80 mg Intravenous BID   gabapentin  400 mg Oral TID   insulin aspart  0-9 Units Subcutaneous TID WC   ipratropium-albuterol  3 mL Nebulization BID   levothyroxine  150 mcg Oral Q0600   loratadine  10 mg Oral Daily   LORazepam  0.5 mg Intravenous Once   pantoprazole  40 mg Oral Daily   predniSONE  30 mg Oral Q breakfast   senna-docusate  1 tablet Oral BID   spironolactone  12.5 mg Oral Daily   Continuous Infusions:  sodium chloride 10 mL/hr at 09/19/22 1758   PRN Meds: sodium chloride, acetaminophen **OR** acetaminophen, albuterol, diclofenac Sodium, guaiFENesin-dextromethorphan, HYDROcodone-acetaminophen, loperamide, LORazepam, melatonin, Muscle Rub, polyethylene glycol   Vital Signs    Vitals:   09/22/22 0743 09/22/22 0846 09/22/22 0857 09/22/22 1012  BP:  (!) 101/49 (!) 105/58 112/60  Pulse:  (!) 105  100  Resp:  20  20  Temp:  (!) 100.9 F (38.3 C)  100.1 F (37.8 C)  TempSrc:  Oral  Oral  SpO2: 90% 95%  92%  Weight:      Height:        Intake/Output Summary (Last 24 hours) at 09/22/2022 1023 Last data filed at 09/22/2022 1018 Gross per 24 hour  Intake 720 ml  Output 4175 ml  Net -3455 ml      09/22/2022    6:09 AM 09/21/2022    6:38 AM 09/20/2022   12:32 AM  Last 3 Weights  Weight (lbs) 318 lb 12.6 oz 319 lb 10.7 oz 322 lb 15.6 oz  Weight (kg) 144.6 kg 145 kg 146.5 kg      Telemetry    SR - Personally Reviewed  ECG    Na - Personally Reviewed  Physical Exam   GEN: No acute distress.   Neck: elevated JVD Cardiac: RRR,  3/6 systolic murmur rusb  Respiratory: coarse bilaterally GI: Soft, nontender, non-distended  MS: No edema; No deformity. Neuro:  Nonfocal  Psych: Normal affect   Labs    High Sensitivity Troponin:   Recent Labs  Lab 09/17/22 1727 09/17/22 2119  TROPONINIHS 5 6     Chemistry Recent Labs  Lab 09/17/22 1705 09/17/22 1939 09/17/22 2119 09/18/22 0132 09/18/22 0418 09/19/22 0038 09/20/22 0052 09/21/22 0029 09/22/22 0042  NA 139   < >  --    < > 138   < > 139 140 141  K 4.0   < >  --    < > 4.2   < > 4.0 4.0 3.7  CL 85*  --   --   --  86*   < > 88* 88* 86*  CO2 44*  --   --   --  42*   < > 43* 43* 38*  GLUCOSE 193*  --   --   --  259*   < > 162* 142* 125*  BUN 17  --   --   --  21*   < >  30* 28* 26*  CREATININE 0.80  --   --   --  0.76   < > 0.77 0.85 0.80  CALCIUM 8.5*  --   --   --  8.6*   < > 8.8* 9.0 8.6*  MG  --   --  1.8  --  2.0  --   --   --   --   PROT 6.7  --   --   --  7.1  --   --   --   --   ALBUMIN 3.0*  --   --   --  3.0*  --   --   --   --   AST 12*  --   --   --  16  --   --   --   --   ALT 7  --   --   --  9  --   --   --   --   ALKPHOS 80  --   --   --  83  --   --   --   --   BILITOT 0.5  --   --   --  0.6  --   --   --   --   GFRNONAA >60  --   --   --  >60   < > >60 >60 >60  ANIONGAP 10  --   --   --  10   < > 8 9 17*   < > = values in this interval not displayed.    Lipids No results for input(s): "CHOL", "TRIG", "HDL", "LABVLDL", "LDLCALC", "CHOLHDL" in the last 168 hours.  Hematology Recent Labs  Lab 09/19/22 0038 09/20/22 0052 09/21/22 0029  WBC 7.2 8.3 8.1  RBC 3.06* 3.20* 3.47*  HGB 7.9* 8.3* 9.0*  HCT 27.9* 29.3* 30.9*  MCV 91.2 91.6 89.0  MCH 25.8* 25.9* 25.9*  MCHC 28.3* 28.3* 29.1*  RDW 15.9* 15.7* 15.5  PLT 260 254 286   Thyroid  Recent Labs  Lab 09/17/22 1705  TSH 3.200    BNP Recent Labs  Lab 09/17/22 1834  BNP 143.7*    DDimer No results for input(s): "DDIMER" in the last 168 hours.   Radiology    No results  found.  Cardiac Studies     Patient Profile     56 y.o. female  hx of chronic COPD who is being seen 09/18/2022 for the evaluation of shortness of breath with moderate aortic stenosis at the request of Dr. Waldron Labs   Assessment & Plan    1.Acute on chronic HFpEF - 07/2022 echo: LVEF 60-65%, no WMAs, grade II dd - CXR mild pulm edema, BNP 143  -neg 2 L yesterday, neg 8.1 L since admission. She is on IV lasix '80mg'$  bid, Cr is stable - remains fluid overloaded, conitnue IV diuresis.  - may consider SGLT2i this admission   2. Moderate aortic stenosis - 07/2022 echo mean grad 31, AVA VTI 1.16, DI 0.41.  - required just monitoring at this time, would repeat echo 1 year   3. PAF/aflutter -  Previous office note from 2022 referred to from Dr. Geraldo Pitter.  Apparently she had been on high-dose amiodarone as a young lady.  Ablation had been recommended to be set up at [facility] at some point.  In review from care everywhere on 10/14 as well as 07/1819 22 she was called multiple times to try to schedule ablation but did  not go through this.  Currently in sinus rhythm.  No need for further evaluation.  - continue eliquis, monitor with prior issues with anemia  4.OHS/COPD - per primary team - on home O2 4L [place]   For questions or updates, please contact [place] Please consult [email or web address] for contact info under        Signed, Carlyle Dolly, MD  09/22/2022, 10:23 AM

## 2022-09-23 DIAGNOSIS — J9621 Acute and chronic respiratory failure with hypoxia: Secondary | ICD-10-CM | POA: Diagnosis not present

## 2022-09-23 LAB — CBC
HCT: 31.9 % — ABNORMAL LOW (ref 36.0–46.0)
Hemoglobin: 8.7 g/dL — ABNORMAL LOW (ref 12.0–15.0)
MCH: 25.7 pg — ABNORMAL LOW (ref 26.0–34.0)
MCHC: 27.3 g/dL — ABNORMAL LOW (ref 30.0–36.0)
MCV: 94.4 fL (ref 80.0–100.0)
Platelets: 254 10*3/uL (ref 150–400)
RBC: 3.38 MIL/uL — ABNORMAL LOW (ref 3.87–5.11)
RDW: 15.3 % (ref 11.5–15.5)
WBC: 8 10*3/uL (ref 4.0–10.5)
nRBC: 0 % (ref 0.0–0.2)

## 2022-09-23 LAB — BASIC METABOLIC PANEL
Anion gap: 11 (ref 5–15)
BUN: 27 mg/dL — ABNORMAL HIGH (ref 6–20)
CO2: 38 mmol/L — ABNORMAL HIGH (ref 22–32)
Calcium: 8.4 mg/dL — ABNORMAL LOW (ref 8.9–10.3)
Chloride: 88 mmol/L — ABNORMAL LOW (ref 98–111)
Creatinine, Ser: 0.86 mg/dL (ref 0.44–1.00)
GFR, Estimated: >60 mL/min (ref >60)
Glucose, Bld: 330 mg/dL — ABNORMAL HIGH (ref 70–99)
Potassium: 3.6 mmol/L (ref 3.5–5.1)
Sodium: 137 mmol/L (ref 135–145)

## 2022-09-23 LAB — GLUCOSE, CAPILLARY
Glucose-Capillary: 195 mg/dL — ABNORMAL HIGH (ref 70–99)
Glucose-Capillary: 216 mg/dL — ABNORMAL HIGH (ref 70–99)
Glucose-Capillary: 275 mg/dL — ABNORMAL HIGH (ref 70–99)
Glucose-Capillary: 322 mg/dL — ABNORMAL HIGH (ref 70–99)
Glucose-Capillary: 366 mg/dL — ABNORMAL HIGH (ref 70–99)

## 2022-09-23 LAB — MAGNESIUM: Magnesium: 2.6 mg/dL — ABNORMAL HIGH (ref 1.7–2.4)

## 2022-09-23 MED ORDER — PREDNISONE 20 MG PO TABS
20.0000 mg | ORAL_TABLET | Freq: Every day | ORAL | Status: DC
  Administered 2022-09-23 – 2022-09-24 (×2): 20 mg via ORAL
  Filled 2022-09-23 (×2): qty 1

## 2022-09-23 MED ORDER — INSULIN ASPART 100 UNIT/ML IJ SOLN
0.0000 [IU] | Freq: Three times a day (TID) | INTRAMUSCULAR | Status: DC
  Administered 2022-09-23: 5 [IU] via SUBCUTANEOUS
  Administered 2022-09-23: 8 [IU] via SUBCUTANEOUS
  Administered 2022-09-23 – 2022-09-24 (×3): 3 [IU] via SUBCUTANEOUS
  Administered 2022-09-24: 8 [IU] via SUBCUTANEOUS
  Administered 2022-09-25 (×2): 3 [IU] via SUBCUTANEOUS
  Administered 2022-09-25: 8 [IU] via SUBCUTANEOUS
  Administered 2022-09-26: 5 [IU] via SUBCUTANEOUS
  Administered 2022-09-26 – 2022-09-27 (×2): 8 [IU] via SUBCUTANEOUS
  Administered 2022-09-27: 3 [IU] via SUBCUTANEOUS
  Administered 2022-09-28: 2 [IU] via SUBCUTANEOUS
  Administered 2022-09-28: 3 [IU] via SUBCUTANEOUS
  Administered 2022-09-28: 5 [IU] via SUBCUTANEOUS
  Administered 2022-09-29 (×2): 8 [IU] via SUBCUTANEOUS
  Administered 2022-09-29 – 2022-09-30 (×2): 3 [IU] via SUBCUTANEOUS
  Administered 2022-09-30: 5 [IU] via SUBCUTANEOUS
  Administered 2022-09-30: 3 [IU] via SUBCUTANEOUS
  Administered 2022-10-01: 5 [IU] via SUBCUTANEOUS
  Administered 2022-10-01: 2 [IU] via SUBCUTANEOUS
  Administered 2022-10-01: 3 [IU] via SUBCUTANEOUS
  Administered 2022-10-02: 11 [IU] via SUBCUTANEOUS
  Administered 2022-10-02: 2 [IU] via SUBCUTANEOUS

## 2022-09-23 MED ORDER — LORAZEPAM 0.5 MG PO TABS
0.5000 mg | ORAL_TABLET | ORAL | Status: DC | PRN
  Administered 2022-09-24 – 2022-10-01 (×9): 0.5 mg via ORAL
  Filled 2022-09-23 (×10): qty 1

## 2022-09-23 MED ORDER — INSULIN ASPART 100 UNIT/ML IJ SOLN
0.0000 [IU] | Freq: Every day | INTRAMUSCULAR | Status: DC
  Administered 2022-09-23: 5 [IU] via SUBCUTANEOUS
  Administered 2022-09-23: 4 [IU] via SUBCUTANEOUS
  Administered 2022-09-24: 5 [IU] via SUBCUTANEOUS
  Administered 2022-09-25: 3 [IU] via SUBCUTANEOUS
  Administered 2022-09-26 – 2022-09-30 (×4): 2 [IU] via SUBCUTANEOUS

## 2022-09-23 MED ORDER — FUROSEMIDE 10 MG/ML IJ SOLN
60.0000 mg | Freq: Three times a day (TID) | INTRAMUSCULAR | Status: DC
  Administered 2022-09-23 – 2022-09-30 (×20): 60 mg via INTRAVENOUS
  Filled 2022-09-23 (×21): qty 6

## 2022-09-23 NOTE — Progress Notes (Signed)
Rounding Note    Patient Name: Chelsea [name] Date of Encounter: 09/23/2022  [facility] Cardiologist: New  Subjective   Ongoing SOB  Inpatient Medications    Scheduled Meds:  apixaban  5 mg Oral BID   budesonide  0.5 mg Inhalation BID   cyanocobalamin  1,000 mcg Oral Daily   dextromethorphan  30 mg Oral BID   doxycycline  100 mg Oral Q12H   ferrous sulfate  325 mg Oral Q breakfast   folic acid  1 mg Oral Daily   furosemide  80 mg Intravenous BID   gabapentin  400 mg Oral TID   insulin aspart  0-15 Units Subcutaneous TID WC   insulin aspart  0-5 Units Subcutaneous QHS   ipratropium-albuterol  3 mL Nebulization BID   levothyroxine  150 mcg Oral Q0600   loratadine  10 mg Oral Daily   pantoprazole  40 mg Oral Daily   predniSONE  20 mg Oral Q breakfast   senna-docusate  1 tablet Oral BID   spironolactone  12.5 mg Oral Daily   Continuous Infusions:  sodium chloride 10 mL/hr at 09/19/22 1758   PRN Meds: sodium chloride, acetaminophen **OR** acetaminophen, albuterol, diclofenac Sodium, guaiFENesin-dextromethorphan, HYDROcodone-acetaminophen, loperamide, LORazepam, melatonin, Muscle Rub, polyethylene glycol   Vital Signs    Vitals:   09/23/22 0500 09/23/22 0745 09/23/22 0748 09/23/22 0922  BP:    (!) 104/54  Pulse:  84  80  Resp:  20  18  Temp:    97.8 F (36.6 C)  TempSrc:    Oral  SpO2:  96% 96% 95%  Weight: (!) 143.3 kg     Height:        Intake/Output Summary (Last 24 hours) at 09/23/2022 1037 Last data filed at 09/23/2022 0925 Gross per 24 hour  Intake 1776.03 ml  Output 1300 ml  Net 476.03 ml      09/23/2022    5:00 AM 09/23/2022   12:06 AM 09/22/2022    6:09 AM  Last 3 Weights  Weight (lbs) 315 lb 14.7 oz 315 lb 14.7 oz 318 lb 12.6 oz  Weight (kg) 143.3 kg 143.3 kg 144.6 kg      Telemetry    SR - Personally Reviewed  ECG    N/a - Personally Reviewed  Physical Exam   GEN: No acute distress.   Neck: +JVD Cardiac:  RRR, no murmurs, rubs, or gallops.  Respiratory: bilaterla wheezing, crackles GI: Soft, nontender, non-distended  MS: trace bilateral edema; No deformity. Neuro:  Nonfocal  Psych: Normal affect   Labs    High Sensitivity Troponin:   Recent Labs  Lab 09/17/22 1727 09/17/22 2119  TROPONINIHS 5 6     Chemistry Recent Labs  Lab 09/17/22 1705 09/17/22 1939 09/17/22 2119 09/18/22 0132 09/18/22 0418 09/19/22 0038 09/22/22 0042 09/22/22 1040 09/23/22 0023 09/23/22 0648  NA 139   < >  --    < > 138   < > 141 139 137  --   K 4.0   < >  --    < > 4.2   < > 3.7 3.7 3.6  --   CL 85*  --   --   --  86*   < > 86* 84* 88*  --   CO2 44*  --   --   --  42*   < > 38* 42* 38*  --   GLUCOSE 193*  --   --   --  259*   < >  125* 165* 330*  --   BUN 17  --   --   --  21*   < > 26* 24* 27*  --   CREATININE 0.80  --   --   --  0.76   < > 0.80 0.79 0.86  --   CALCIUM 8.5*  --   --   --  8.6*   < > 8.6* 9.0 8.4*  --   MG  --   --  1.8  --  2.0  --   --   --   --  2.6*  PROT 6.7  --   --   --  7.1  --   --   --   --   --   ALBUMIN 3.0*  --   --   --  3.0*  --   --   --   --   --   AST 12*  --   --   --  16  --   --   --   --   --   ALT 7  --   --   --  9  --   --   --   --   --   ALKPHOS 80  --   --   --  83  --   --   --   --   --   BILITOT 0.5  --   --   --  0.6  --   --   --   --   --   GFRNONAA >60  --   --   --  >60   < > >60 >60 >60  --   ANIONGAP 10  --   --   --  10   < > 17* 13 11  --    < > = values in this interval not displayed.    Lipids No results for input(s): "CHOL", "TRIG", "HDL", "LABVLDL", "LDLCALC", "CHOLHDL" in the last 168 hours.  Hematology Recent Labs  Lab 09/20/22 0052 09/21/22 0029 09/23/22 0648  WBC 8.3 8.1 8.0  RBC 3.20* 3.47* 3.38*  HGB 8.3* 9.0* 8.7*  HCT 29.3* 30.9* 31.9*  MCV 91.6 89.0 94.4  MCH 25.9* 25.9* 25.7*  MCHC 28.3* 29.1* 27.3*  RDW 15.7* 15.5 15.3  PLT 254 286 254   Thyroid  Recent Labs  Lab 09/17/22 1705  TSH 3.200    BNP Recent  Labs  Lab 09/17/22 1834  BNP 143.7*    DDimer No results for input(s): "DDIMER" in the last 168 hours.   Radiology    DG CHEST PORT 1 VIEW  Result Date: 09/22/2022 CLINICAL DATA:  Short of breath. EXAM: PORTABLE CHEST 1 VIEW COMPARISON:  09/17/2022.  CT, 07/29/2022. FINDINGS: Stable enlargement of the cardiac silhouette. No mediastinal or hilar masses. Mild left perihilar and bilateral lung base opacities consistent with atelectasis. Mild central vascular congestion with no overt pulmonary edema. No evidence of pneumonia. No convincing pleural effusion and no pneumothorax. Skeletal structures are grossly intact. IMPRESSION: No acute cardiopulmonary disease. Electronically Signed   By: Lajean Manes M.D.   On: 09/22/2022 12:55    Cardiac Studies     Patient Profile     56 y.o. female  hx of chronic COPD who is being seen 09/18/2022 for the evaluation of shortness of breath with moderate aortic stenosis at the request of Dr. Waldron Labs    Assessment & Plan    1.Acute on chronic HFpEF -  07/2022 echo: LVEF 60-65%, no WMAs, grade II dd - CXR mild pulm edema, BNP 143   -neg 1.7 L yesterday, neg 9.8 L since admission. She is on IV lasix '80mg'$  bid, Cr is stable. Increase IV lasix to '60mg'$  tid IV - may consider SGLT2i this admission     2. Moderate aortic stenosis - 07/2022 echo mean grad 31, AVA VTI 1.16, DI 0.41.  - required just monitoring at this time, would repeat echo 1 year    3. PAF/aflutter -  Previous office note from 2022 referred to from Dr. Geraldo Pitter.  Apparently she had been on high-dose amiodarone as a young lady.  Ablation had been recommended to be set up at [facility] at some point.  In review from care everywhere on 10/14 as well as 07/1819 22 she was called multiple times to try to schedule ablation but did not go through this.  Currently in sinus rhythm.  No need for further evaluation.  - continue eliquis, monitor with prior issues with anemia   4.OHS/COPD -  per primary team - on home O2 4L   For questions or updates, please contact [place] Please consult [email or web address] for contact info under        Signed, Carlyle Dolly, MD  09/23/2022, 10:37 AM

## 2022-09-23 NOTE — Progress Notes (Signed)
[facility] Progress Note Patient: Chelsea [name] IRC:[id number] DOB: [birth year] DOA: 09/17/2022  DOS: the patient was seen and examined on 09/23/2022  Brief hospital course: Chelsea [name] is a 56 y.o. F with asthma/COPD, dCHF and OHS and hx tracheostomy now changed, MO with BMI 54, and chronic respiratory failure on 3L home O2, anemia, AF on eliquis, DM, HTN lives in ALF, who presented with .. dyspnea. 12/18: Started on steroids, diuresis 12/19: Cardiology consulted 12/23.  Hypoxia worsening.  Started on continuous BiPAP.  Patient has been refusing BiPAP therapy in the hospital. Assessment and Plan: Acute on chronic hypoxic and hypercarbic respiratory failure COPD with acute exacerbation. Obesity hypoventilation syndrome. Patient is chronically on 4 L of oxygen (not 3). Patient is on BiPAP at home but refusing that therapy here in the hospital. Presents with complaints of shortness of breath and treated with IV steroids for COPD exacerbation with azithromycin and IV diuresis. Oxygenation improved compared to 10 L on 12/23 to 6 L on 12/24. Continue BiPAP nightly and as needed. On oral prednisone which I will continue.  Continue DuoNebs. Will monitor for now.  Acute on chronic diastolic CHF. Echocardiogram in October shows preserved EF and grade 2 diastolic and moderate aortic stenosis. Currently on IV Lasix. Will continue.  Left forearm and right leg cellulitis. Patient does have some redness and warmth involving her left upper extremity as well as right lower extremity.  Fever curve improving. Doxycycline added.  Redness is improving.  Moderate aortic stenosis. Monitor for now.  Management per cardiology.  Paroxysmal A-fib. Currently rate controlled. Continue Eliquis.  Not on any rate control medication.  Acute on chronic iron deficiency anemia. H&H relatively stable.  Monitor.  Transfuse for hemoglobin less than 7.  Diabetes mellitus, uncontrolled with  hyperglycemia without long-term insulin use Hyperglycemia in the hospital most likely secondary to steroids. Recent hemoglobin A1c 5.2. Trending down from prior values which is excellent without any medication. For now we will monitor.  Morbid obesity. Body mass index is 52.57 kg/m.  Placing the patient at high risk of poor outcome.   Subjective: Drowsy.  No nausea no vomiting.  Able to follow commands.  Oxygenation improved.  No chest pain.  Physical Exam: General: Appear in moderate distress; Cardiovascular: S1 and S2 Present, aortic systolic  Murmur, Respiratory: increased respiratory effort, Bilateral Air entry present, basal Crackles, no wheezes Abdomen: Bowel Sound present, Non tender  Extremities: Chronic lymphedema pedal edema Neurology: Drowsy and oriented to time, place, and person, able to follow commands no focal deficit  Data Reviewed: I have Reviewed nursing notes, Vitals, and Lab results. Since last encounter, pertinent lab results CBC and BMP   . I have ordered test including CBC and BMP  .    Disposition: Status is: Inpatient Remains inpatient appropriate because: Need for IV diuresis and BiPAP therapy  SCDs Start: 09/17/22 2119 apixaban (ELIQUIS) tablet 5 mg   Family Communication: No one at bedside Level of care: Telemetry Cardiac Continue telemetry for CHF. Vitals:   09/23/22 0745 09/23/22 0748 09/23/22 0922 09/23/22 1758  BP:   (!) 104/54 123/69  Pulse: 84  80 81  Resp: 20  18   Temp:   97.8 F (36.6 C)   TempSrc:   Oral   SpO2: 96% 96% 95% 92%  Weight:      Height:         Author: Berle Mull, MD 09/23/2022 6:53 PM  Please look on [email or web address] to find out who is on call.

## 2022-09-23 NOTE — Plan of Care (Signed)

## 2022-09-24 DIAGNOSIS — J9621 Acute and chronic respiratory failure with hypoxia: Secondary | ICD-10-CM | POA: Diagnosis not present

## 2022-09-24 LAB — GLUCOSE, CAPILLARY
Glucose-Capillary: 167 mg/dL — ABNORMAL HIGH (ref 70–99)
Glucose-Capillary: 175 mg/dL — ABNORMAL HIGH (ref 70–99)
Glucose-Capillary: 278 mg/dL — ABNORMAL HIGH (ref 70–99)
Glucose-Capillary: 356 mg/dL — ABNORMAL HIGH (ref 70–99)

## 2022-09-24 LAB — BASIC METABOLIC PANEL
Anion gap: 12 (ref 5–15)
BUN: 32 mg/dL — ABNORMAL HIGH (ref 6–20)
CO2: 40 mmol/L — ABNORMAL HIGH (ref 22–32)
Calcium: 8.3 mg/dL — ABNORMAL LOW (ref 8.9–10.3)
Chloride: 88 mmol/L — ABNORMAL LOW (ref 98–111)
Creatinine, Ser: 0.91 mg/dL (ref 0.44–1.00)
GFR, Estimated: >60 mL/min (ref >60)
Glucose, Bld: 220 mg/dL — ABNORMAL HIGH (ref 70–99)
Potassium: 4.1 mmol/L (ref 3.5–5.1)
Sodium: 140 mmol/L (ref 135–145)

## 2022-09-24 LAB — CBC
HCT: 31.8 % — ABNORMAL LOW (ref 36.0–46.0)
Hemoglobin: 9.1 g/dL — ABNORMAL LOW (ref 12.0–15.0)
MCH: 26.3 pg (ref 26.0–34.0)
MCHC: 28.6 g/dL — ABNORMAL LOW (ref 30.0–36.0)
MCV: 91.9 fL (ref 80.0–100.0)
Platelets: 247 10*3/uL (ref 150–400)
RBC: 3.46 MIL/uL — ABNORMAL LOW (ref 3.87–5.11)
RDW: 15.1 % (ref 11.5–15.5)
WBC: 6.8 10*3/uL (ref 4.0–10.5)
nRBC: 0 % (ref 0.0–0.2)

## 2022-09-24 LAB — MAGNESIUM: Magnesium: 2.2 mg/dL (ref 1.7–2.4)

## 2022-09-24 MED ORDER — PREDNISONE 10 MG PO TABS
10.0000 mg | ORAL_TABLET | Freq: Every day | ORAL | Status: AC
  Administered 2022-09-25 – 2022-09-26 (×2): 10 mg via ORAL
  Filled 2022-09-24 (×2): qty 1

## 2022-09-24 MED ORDER — EMPAGLIFLOZIN 10 MG PO TABS
10.0000 mg | ORAL_TABLET | Freq: Every day | ORAL | Status: DC
  Administered 2022-09-24 – 2022-10-02 (×9): 10 mg via ORAL
  Filled 2022-09-24 (×9): qty 1

## 2022-09-24 MED ORDER — SENNOSIDES-DOCUSATE SODIUM 8.6-50 MG PO TABS: 1.0000 | ORAL_TABLET | Freq: Two times a day (BID) | ORAL | Status: DC | PRN

## 2022-09-24 NOTE — Progress Notes (Signed)
Rounding Note    Patient Name: Chelsea [name] Date of Encounter: 09/24/2022  [facility] Cardiologist: Dr. Candee Furbish  Subjective   Patient still appears somewhat short of breath but feels clinically improved since admission.  Inpatient Medications    Scheduled Meds:  apixaban  5 mg Oral BID   budesonide  0.5 mg Inhalation BID   cyanocobalamin  1,000 mcg Oral Daily   dextromethorphan  30 mg Oral BID   doxycycline  100 mg Oral Q12H   ferrous sulfate  325 mg Oral Q breakfast   folic acid  1 mg Oral Daily   furosemide  60 mg Intravenous TID   gabapentin  400 mg Oral TID   insulin aspart  0-15 Units Subcutaneous TID WC   insulin aspart  0-5 Units Subcutaneous QHS   ipratropium-albuterol  3 mL Nebulization BID   levothyroxine  150 mcg Oral Q0600   loratadine  10 mg Oral Daily   pantoprazole  40 mg Oral Daily   predniSONE  20 mg Oral Q breakfast   senna-docusate  1 tablet Oral BID   spironolactone  12.5 mg Oral Daily   Continuous Infusions:  sodium chloride 10 mL/hr at 09/19/22 1758   PRN Meds: sodium chloride, acetaminophen **OR** acetaminophen, albuterol, diclofenac Sodium, guaiFENesin-dextromethorphan, HYDROcodone-acetaminophen, loperamide, LORazepam, melatonin, Muscle Rub, polyethylene glycol   Vital Signs    Vitals:   09/23/22 1758 09/23/22 1945 09/23/22 2039 09/24/22 0625  BP: 123/69  113/61 130/82  Pulse: 81  79 71  Resp:   20 15  Temp:   98.7 F (37.1 C) 98.7 F (37.1 C)  TempSrc:   Oral Oral  SpO2: 92% 95% 94% 95%  Weight:    (!) 144 kg  Height:        Intake/Output Summary (Last 24 hours) at 09/24/2022 0746 Last data filed at 09/24/2022 0600 Gross per 24 hour  Intake 1580 ml  Output 3650 ml  Net -2070 ml      09/24/2022    6:25 AM 09/23/2022    5:00 AM 09/23/2022   12:06 AM  Last 3 Weights  Weight (lbs) 317 lb 7.4 oz 315 lb 14.7 oz 315 lb 14.7 oz  Weight (kg) 144 kg 143.3 kg 143.3 kg      Telemetry    Sinus rhythm-  Personally Reviewed  ECG    Not performed today- Personally Reviewed  Physical Exam   GEN: No acute distress.   Neck: No JVD Cardiac: RRR, 2/6 outflow tract murmur consistent with aortic stenosis rubs, or gallops.  Respiratory: Clear to auscultation bilaterally. GI: Soft, nontender, non-distended  MS: No edema; No deformity. Neuro:  Nonfocal  Psych: Normal affect  Ext-1+ lower extremity edema with venous stasis changes  Labs    High Sensitivity Troponin:   Recent Labs  Lab 09/17/22 1727 09/17/22 2119  TROPONINIHS 5 6     Chemistry Recent Labs  Lab 09/17/22 1705 09/17/22 1939 09/18/22 0418 09/19/22 0038 09/22/22 1040 09/23/22 0023 09/23/22 0648 09/24/22 0049  NA 139   < > 138   < > 139 137  --  140  K 4.0   < > 4.2   < > 3.7 3.6  --  4.1  CL 85*  --  86*   < > 84* 88*  --  88*  CO2 44*  --  42*   < > 42* 38*  --  40*  GLUCOSE 193*  --  259*   < > 165*  330*  --  220*  BUN 17  --  21*   < > 24* 27*  --  32*  CREATININE 0.80  --  0.76   < > 0.79 0.86  --  0.91  CALCIUM 8.5*  --  8.6*   < > 9.0 8.4*  --  8.3*  MG  --    < > 2.0  --   --   --  2.6* 2.2  PROT 6.7  --  7.1  --   --   --   --   --   ALBUMIN 3.0*  --  3.0*  --   --   --   --   --   AST 12*  --  16  --   --   --   --   --   ALT 7  --  9  --   --   --   --   --   ALKPHOS 80  --  83  --   --   --   --   --   BILITOT 0.5  --  0.6  --   --   --   --   --   GFRNONAA >60  --  >60   < > >60 >60  --  >60  ANIONGAP 10  --  10   < > 13 11  --  12   < > = values in this interval not displayed.    Lipids No results for input(s): "CHOL", "TRIG", "HDL", "LABVLDL", "LDLCALC", "CHOLHDL" in the last 168 hours.  Hematology Recent Labs  Lab 09/21/22 0029 09/23/22 0648 09/24/22 0049  WBC 8.1 8.0 6.8  RBC 3.47* 3.38* 3.46*  HGB 9.0* 8.7* 9.1*  HCT 30.9* 31.9* 31.8*  MCV 89.0 94.4 91.9  MCH 25.9* 25.7* 26.3  MCHC 29.1* 27.3* 28.6*  RDW 15.5 15.3 15.1  PLT 286 254 247   Thyroid  Recent Labs  Lab  09/17/22 1705  TSH 3.200    BNP Recent Labs  Lab 09/17/22 1834  BNP 143.7*    DDimer No results for input(s): "DDIMER" in the last 168 hours.   Radiology    DG CHEST PORT 1 VIEW  Result Date: 09/22/2022 CLINICAL DATA:  Short of breath. EXAM: PORTABLE CHEST 1 VIEW COMPARISON:  09/17/2022.  CT, 07/29/2022. FINDINGS: Stable enlargement of the cardiac silhouette. No mediastinal or hilar masses. Mild left perihilar and bilateral lung base opacities consistent with atelectasis. Mild central vascular congestion with no overt pulmonary edema. No evidence of pneumonia. No convincing pleural effusion and no pneumothorax. Skeletal structures are grossly intact. IMPRESSION: No acute cardiopulmonary disease. Electronically Signed   By: Lajean Manes M.D.   On: 09/22/2022 12:55    Cardiac Studies   None  Patient Profile     ROSITA GUZZETTA is a 56 y.o. female with a hx of chronic COPD who is being seen 09/18/2022 for the evaluation of shortness of breath with moderate aortic stenosis at the request of Dr. Waldron Labs.   Assessment & Plan    1: Acute on chronic HFpEF-her last 2D echo performed 10/23 revealed EF of 60 to 65% with grade 2 diastolic dysfunction.  Her BNP was only 143.  She is on IV furosemide with an I/O- 12 L on furosemide 60 mg IV every 6 hours.  Renal function has remained stable.  Would continue IV Lasix for the next 2 or 3 days and then transition to oral furosemide (80  mg p.o. twice daily).  2: Moderate aortic stenosis-valve area 1.16 cm with a peak gradient of approximately 30.  This can be followed with serial echoes as an outpatient.  3: History of PAF-currently in sinus rhythm on Eliquis  4: COPD-per primary team on home O2.     For questions or updates, please contact [place] Please consult [email or web address] for contact info under        Signed, Quay Burow, MD  09/24/2022, 7:46 AM

## 2022-09-24 NOTE — Progress Notes (Signed)
[facility] Progress Note Patient: JASMEEN FRITSCH GXQ:[id number] DOB: 04/28/1966 DOA: 09/17/2022  DOS: the patient was seen and examined on 09/24/2022  Brief hospital course: Mrs. Yacoub is a 56 y.o. F with asthma/COPD, dCHF and OHS and hx tracheostomy now changed, MO with BMI 54, and chronic respiratory failure on 3L home O2, anemia, AF on eliquis, DM, HTN lives in ALF, who presented with .. dyspnea. 12/18: Started on steroids, diuresis 12/19: Cardiology consulted 12/23.  Hypoxia worsening.  Started on continuous BiPAP.  Patient has been refusing BiPAP therapy in the hospital. Assessment and Plan: Acute on chronic hypoxic and hypercarbic respiratory failure COPD with acute exacerbation. Obesity hypoventilation syndrome. Patient is chronically on 4 L of oxygen (not 3). Patient is on BiPAP at home but refusing that therapy here in the hospital. Presents with complaints of shortness of breath and treated with IV steroids for COPD exacerbation with azithromycin and IV diuresis. Oxygenation improved compared to 10 L on 12/23 to 6 L on 12/24. Continue BiPAP nightly and as needed.  Emphasized the importance of compliance. Continue DuoNebs.  Taper prednisone off. Will monitor for now.  Acute on chronic diastolic CHF. Echocardiogram in October shows preserved EF and grade 2 diastolic and moderate aortic stenosis. Currently on IV Lasix. Will continue.  Left forearm and right leg cellulitis. Patient does have some redness and warmth involving her left upper extremity as well as right lower extremity.  Fever curve improving. Improving with the doxycycline.  Moderate aortic stenosis. Monitor for now.  Management per cardiology.  Paroxysmal A-fib. Currently rate controlled. Continue Eliquis.  Not on any rate control medication.  Acute on chronic iron deficiency anemia. H&H relatively stable.  Monitor.  Transfuse for hemoglobin less than 7.  Diabetes mellitus, uncontrolled  with hyperglycemia without long-term insulin use Hyperglycemia in the hospital most likely secondary to steroids. Recent hemoglobin A1c 5.2. Trending down from prior values which is excellent without any medication. For now we will monitor.  Morbid obesity. Body mass index is 52.83 kg/m.  Placing the patient at high risk of poor outcome.   Subjective: More alert.  Had 2 BMs so far loose.  No nausea no vomiting.  No abdominal pain.  Did not use BiPAP at nights  Physical Exam: General: Appear in mild distress; Cardiovascular: S1 and S2 Present, no Murmur, Respiratory: good respiratory effort, Bilateral Air entry present, bilateral Crackles, no wheezes Abdomen: Bowel Sound present, Non tender  Extremities: Chronic pedal edema Neurology: alert and oriented to time, place, and person  Data Reviewed: I have Reviewed nursing notes, Vitals, and Lab results. Since last encounter, pertinent lab results CBC and BMP   . I have ordered test including CBC and BMP  .     Disposition: Status is: Inpatient Remains inpatient appropriate because: Need for IV diuresis and BiPAP therapy  SCDs Start: 09/17/22 2119 apixaban (ELIQUIS) tablet 5 mg   Family Communication: No one at bedside Level of care: Telemetry Cardiac Continue telemetry for CHF. Vitals:   09/23/22 2039 09/24/22 0625 09/24/22 0937 09/24/22 1202  BP: 113/61 130/82  116/84  Pulse: 79 71 80 76  Resp: '20 15 20 20  '$ Temp: 98.7 F (37.1 C) 98.7 F (37.1 C)  98.5 F (36.9 C)  TempSrc: Oral Oral  Oral  SpO2: 94% 95% 96% 95%  Weight:  (!) 144 kg    Height:         Author: Berle Mull, MD 09/24/2022 2:28 PM  Please look on [email or web address] to find  out who is on call.

## 2022-09-25 DIAGNOSIS — J9621 Acute and chronic respiratory failure with hypoxia: Secondary | ICD-10-CM | POA: Diagnosis not present

## 2022-09-25 LAB — MAGNESIUM: Magnesium: 2.3 mg/dL (ref 1.7–2.4)

## 2022-09-25 LAB — GLUCOSE, CAPILLARY
Glucose-Capillary: 166 mg/dL — ABNORMAL HIGH (ref 70–99)
Glucose-Capillary: 169 mg/dL — ABNORMAL HIGH (ref 70–99)
Glucose-Capillary: 277 mg/dL — ABNORMAL HIGH (ref 70–99)
Glucose-Capillary: 227 mg/dL — ABNORMAL HIGH (ref 70–99)

## 2022-09-25 LAB — BASIC METABOLIC PANEL
Anion gap: 10 (ref 5–15)
BUN: 31 mg/dL — ABNORMAL HIGH (ref 6–20)
CO2: 43 mmol/L — ABNORMAL HIGH (ref 22–32)
Calcium: 8.5 mg/dL — ABNORMAL LOW (ref 8.9–10.3)
Chloride: 86 mmol/L — ABNORMAL LOW (ref 98–111)
Creatinine, Ser: 0.68 mg/dL (ref 0.44–1.00)
GFR, Estimated: >60 mL/min (ref >60)
Glucose, Bld: 220 mg/dL — ABNORMAL HIGH (ref 70–99)
Potassium: 4 mmol/L (ref 3.5–5.1)
Sodium: 139 mmol/L (ref 135–145)

## 2022-09-25 LAB — CBC
HCT: 32.9 % — ABNORMAL LOW (ref 36.0–46.0)
Hemoglobin: 9.2 g/dL — ABNORMAL LOW (ref 12.0–15.0)
MCH: 25.9 pg — ABNORMAL LOW (ref 26.0–34.0)
MCHC: 28 g/dL — ABNORMAL LOW (ref 30.0–36.0)
MCV: 92.7 fL (ref 80.0–100.0)
Platelets: 321 10*3/uL (ref 150–400)
RBC: 3.55 MIL/uL — ABNORMAL LOW (ref 3.87–5.11)
RDW: 15 % (ref 11.5–15.5)
WBC: 8.7 10*3/uL (ref 4.0–10.5)
nRBC: 0 % (ref 0.0–0.2)

## 2022-09-25 LAB — HEMOGLOBIN A1C
Hgb A1c MFr Bld: 6.1 % — ABNORMAL HIGH (ref 4.8–5.6)
Mean Plasma Glucose: 128 mg/dL

## 2022-09-25 MED ORDER — HYDROCOD POLI-CHLORPHE POLI ER 10-8 MG/5ML PO SUER
5.0000 mL | Freq: Every day | ORAL | Status: AC
  Administered 2022-09-25 – 2022-09-26 (×2): 5 mL via ORAL
  Filled 2022-09-25 (×3): qty 5

## 2022-09-25 NOTE — Progress Notes (Signed)
[facility] Progress Note Patient: CINTIA GLEED WCH:[id number] DOB: 04-Jul-1966 DOA: 09/17/2022  DOS: the patient was seen and examined on 09/25/2022  Brief hospital course: Mrs. Hileman is a 56 y.o. F with asthma/COPD, dCHF and OHS and hx tracheostomy now changed, MO with BMI 54, and chronic respiratory failure on 3L home O2, anemia, AF on eliquis, DM, HTN lives in ALF, who presented with .. dyspnea. 12/18: Started on steroids, diuresis 12/19: Cardiology consulted 12/23.  Hypoxia worsening.  Started on continuous BiPAP.  Patient has been refusing BiPAP therapy in the hospital. 12/26.  Initiated on Tussionex nightly for cough. Assessment and Plan: Acute on chronic hypoxic and hypercarbic respiratory failure COPD with acute exacerbation. Obesity hypoventilation syndrome. Patient is chronically on 4 L of oxygen (not 3). Patient is on BiPAP at home but refusing that therapy here in the hospital. Presents with complaints of shortness of breath and treated with IV steroids for COPD exacerbation with azithromycin and IV diuresis. Oxygenation improved compared to 10 L on 12/23 to 6 L on 12/24. Continue BiPAP nightly and as needed.  Emphasized the importance of compliance. Continue DuoNebs.  Taper prednisone off.  Added Tussionex nightly. Will monitor for now.  Acute on chronic diastolic CHF. Echocardiogram in October shows preserved EF and grade 2 diastolic and moderate aortic stenosis. Currently on IV Lasix. Will continue.  Left forearm and right leg cellulitis. Patient does have some redness and warmth involving her left upper extremity as well as right lower extremity.  Fever curve normalizing Improving with the doxycycline.  Moderate aortic stenosis. Monitor for now.  Management per cardiology.  Paroxysmal A-fib. Currently rate controlled. Continue Eliquis.  Not on any rate control medication.  Acute on chronic iron deficiency anemia. H&H relatively stable.  Monitor.   Transfuse for hemoglobin less than 7.  Diabetes mellitus, uncontrolled with hyperglycemia without long-term insulin use Hyperglycemia in the hospital most likely secondary to steroids. Recent hemoglobin A1c 5.2. Trending down from prior values which is excellent without any medication. For now we will monitor.  Morbid obesity. Body mass index is 51.95 kg/m.  Placing the patient at high risk of poor outcome.   Subjective: No nausea no vomiting.  No fever no chills.  Continues to have cough.  Physical Exam: General: Appear in mild distress; Cardiovascular: S1 and S2 Present, aortic systolic Murmur, Respiratory: good respiratory effort, Bilateral Air entry present, basal Crackles, no wheezes Abdomen: Bowel Sound present, Non tender  Extremities: bilateral  Pedal edema Neurology: alert and oriented to time, place, and person  Data Reviewed: I have Reviewed nursing notes, Vitals, and Lab results. Since last encounter, pertinent lab results CBC and BMP   . I have ordered test including CBC and BMP  .      Disposition: Status is: Inpatient Remains inpatient appropriate because: Need for IV diuresis and BiPAP therapy  SCDs Start: 09/17/22 2119 apixaban (ELIQUIS) tablet 5 mg   Family Communication: No one at bedside Level of care: Telemetry Cardiac Continue telemetry for CHF. Vitals:   09/25/22 0347 09/25/22 0950 09/25/22 1931 09/25/22 1951  BP: 133/78 105/68  104/65  Pulse: 71 82  80  Resp: 20 (!) 21  20  Temp: 98.2 F (36.8 C) 98.6 F (37 C)  98.2 F (36.8 C)  TempSrc: Oral Oral  Oral  SpO2: 93% 93% 94% 93%  Weight: (!) 141.6 kg     Height:         Author: Berle Mull, MD 09/25/2022 8:09 PM  Please  look on [email or web address] to find out who is on call.

## 2022-09-25 NOTE — Progress Notes (Addendum)
Rounding Note    Patient Name: Chelsea [name] Date of Encounter: 09/25/2022  [facility] Cardiologist: Candee Furbish, MD   Subjective   Patient appears in no acute distress this morning. She reports ongoing cough that is productive of thick sputum, currently feels like this is the primary factor behind her dyspnea as she says she did not feel volume up on admission. Denies chest pain or palpitations.  Inpatient Medications    Scheduled Meds:  apixaban  5 mg Oral BID   budesonide  0.5 mg Inhalation BID   cyanocobalamin  1,000 mcg Oral Daily   dextromethorphan  30 mg Oral BID   doxycycline  100 mg Oral Q12H   empagliflozin  10 mg Oral Daily   ferrous sulfate  325 mg Oral Q breakfast   folic acid  1 mg Oral Daily   furosemide  60 mg Intravenous TID   gabapentin  400 mg Oral TID   insulin aspart  0-15 Units Subcutaneous TID WC   insulin aspart  0-5 Units Subcutaneous QHS   ipratropium-albuterol  3 mL Nebulization BID   levothyroxine  150 mcg Oral Q0600   loratadine  10 mg Oral Daily   pantoprazole  40 mg Oral Daily   predniSONE  10 mg Oral Q breakfast   spironolactone  12.5 mg Oral Daily   Continuous Infusions:  sodium chloride 10 mL/hr at 09/19/22 1758   PRN Meds: sodium chloride, acetaminophen **OR** acetaminophen, albuterol, diclofenac Sodium, guaiFENesin-dextromethorphan, HYDROcodone-acetaminophen, loperamide, LORazepam, melatonin, Muscle Rub, senna-docusate   Vital Signs    Vitals:   09/24/22 1202 09/24/22 2016 09/24/22 2110 09/25/22 0347  BP: 116/84 105/63  133/78  Pulse: 76 80  71  Resp: 20 20  (P) 20  Temp: 98.5 F (36.9 C) 98.6 F (37 C)  (P) 98.2 F (36.8 C)  TempSrc: Oral (P) Oral  (P) Oral  SpO2: 95% 95% 92% 93%  Weight:    (!) (P) 141.6 kg  Height:        Intake/Output Summary (Last 24 hours) at 09/25/2022 0742 Last data filed at 09/25/2022 0151 Gross per 24 hour  Intake --  Output 3550 ml  Net -3550 ml      09/25/2022     3:47 AM 09/24/2022    6:25 AM 09/23/2022    5:00 AM  Last 3 Weights  Weight (lbs) 312 lb 2.7 oz 317 lb 7.4 oz 315 lb 14.7 oz  Weight (kg) 141.6 kg 144 kg 143.3 kg      Telemetry    Sinus rhythm, rates 60s-80s - Personally Reviewed  ECG    No new tracing  Physical Exam   GEN: No acute distress.   Neck: Unable to assess due to body habitus Cardiac: RRR, no murmurs, rubs, or gallops.  Respiratory: bilateral inspiratory rhonchi. No rales noted. GI: Soft, non-tender MS: 3+ lower extremity edema bilaterally. Lower extremities both warm to the touch. No deformity. Neuro:  Nonfocal  Psych: Normal affect   Labs    High Sensitivity Troponin:   Recent Labs  Lab 09/17/22 1727 09/17/22 2119  TROPONINIHS 5 6     Chemistry Recent Labs  Lab 09/23/22 0023 09/23/22 0648 09/24/22 0049 09/25/22 0107  NA 137  --  140 139  K 3.6  --  4.1 4.0  CL 88*  --  88* 86*  CO2 38*  --  40* 43*  GLUCOSE 330*  --  220* 220*  BUN 27*  --  32* 31*  CREATININE 0.86  --  0.91 0.68  CALCIUM 8.4*  --  8.3* 8.5*  MG  --  2.6* 2.2 2.3  GFRNONAA >60  --  >60 >60  ANIONGAP 11  --  12 10    Lipids No results for input(s): "CHOL", "TRIG", "HDL", "LABVLDL", "LDLCALC", "CHOLHDL" in the last 168 hours.  Hematology Recent Labs  Lab 09/23/22 0648 09/24/22 0049 09/25/22 0107  WBC 8.0 6.8 8.7  RBC 3.38* 3.46* 3.55*  HGB 8.7* 9.1* 9.2*  HCT 31.9* 31.8* 32.9*  MCV 94.4 91.9 92.7  MCH 25.7* 26.3 25.9*  MCHC 27.3* 28.6* 28.0*  RDW 15.3 15.1 15.0  PLT 254 247 321   Thyroid No results for input(s): "TSH", "FREET4" in the last 168 hours.  BNPNo results for input(s): "BNP", "PROBNP" in the last 168 hours.  DDimer No results for input(s): "DDIMER" in the last 168 hours.   Radiology    No results found.  Cardiac Studies   07/30/22 TTE  IMPRESSIONS     1. Left ventricular ejection fraction, by estimation, is 60 to 65%. The  left ventricle has normal function. The left ventricle has no  regional  wall motion abnormalities. There is mild left ventricular hypertrophy.  Left ventricular diastolic parameters  are consistent with Grade II diastolic dysfunction (pseudonormalization).  Elevated left ventricular end-diastolic pressure.   2. Right ventricular systolic function is normal. The right ventricular  size is mildly enlarged. Tricuspid regurgitation signal is inadequate for  assessing PA pressure.   3. Left atrial size was severely dilated.   4. The mitral valve is degenerative. Trivial mitral valve regurgitation.  No evidence of mitral stenosis.   5. The aortic valve is abnormal. Unable to determine aortic valve  morphology due to image quality. There is moderate calcification of the  aortic valve. Aortic valve regurgitation is trivial. Moderate aortic valve  stenosis. Aortic valve area, by VTI  measures 1.16 cm. Aortic valve mean gradient measures 31.0 mmHg. Aortic  valve Vmax measures 3.58 m/s.   6. The inferior vena cava is dilated in size with >50% respiratory  variability, suggesting right atrial pressure of 8 mmHg.   7. Increased flow velocities may be secondary to anemia, thyrotoxicosis,  hyperdynamic or high flow state.   FINDINGS   Left Ventricle: Left ventricular ejection fraction, by estimation, is 60  to 65%. The left ventricle has normal function. The left ventricle has no  regional wall motion abnormalities. The left ventricular internal cavity  size was normal in size. There is   mild left ventricular hypertrophy. Left ventricular diastolic parameters  are consistent with Grade II diastolic dysfunction (pseudonormalization).  Elevated left ventricular end-diastolic pressure.   Right Ventricle: The right ventricular size is mildly enlarged. No  increase in right ventricular wall thickness. Right ventricular systolic  function is normal. Tricuspid regurgitation signal is inadequate for  assessing PA pressure.   Left Atrium: Left atrial size was  severely dilated.   Right Atrium: Right atrial size was normal in size.   Pericardium: There is no evidence of pericardial effusion.   Mitral Valve: The mitral valve is degenerative in appearance. Mild mitral  annular calcification. Trivial mitral valve regurgitation. No evidence of  mitral valve stenosis.   Tricuspid Valve: The tricuspid valve is normal in structure. Tricuspid  valve regurgitation is trivial. No evidence of tricuspid stenosis.   Aortic Valve: The aortic valve is abnormal. There is moderate  calcification of the aortic valve. Aortic valve regurgitation is trivial.  Moderate  aortic stenosis is present. Aortic valve mean gradient measures  31.0 mmHg. Aortic valve peak gradient  measures 51.3 mmHg. Aortic valve area, by VTI measures 1.16 cm.   Pulmonic Valve: The pulmonic valve was normal in structure. Pulmonic valve  regurgitation is trivial. No evidence of pulmonic stenosis.   Aorta: The aortic root is normal in size and structure.   Venous: The inferior vena cava is dilated in size with greater than 50%  respiratory variability, suggesting right atrial pressure of 8 mmHg.   IAS/Shunts: The atrial septum is grossly normal.   Patient Profile     Chelsea [name] is a 56 y.o. female with a hx of chronic COPD who is being seen 09/18/2022 for the evaluation of shortness of breath with moderate aortic stenosis at the request of Dr. Waldron Labs.    Assessment & Plan    Acute on chronic HFpEF  Patient with LVEF 60-65% and grade II diastolic dysfunction on 26/83/41 TTE. Admitted in the setting of acute shortness of breath, found to be volume up with BNP 143.7. Has been receiving IV lasix, initially '80mg'$  BID from 12/18-12/23, now '60mg'$  TID. Net output this admission is -15.8 L. Creatinine/BUN remain stable.   Continue IV diuresis today. Transition to oral dose, suspect she may respond better to Torsemide, '40mg'$  BID (double admission dose). Continue Spironolactone  12.'5mg'$  QD.  Continue Jardiance   Moderate aortic stenosis  Patient with systolic murmur on physical exam, TTE showing moderate AS with peak gradient ~30. Patient should have ongoing outpatient follow up.  Paroxysmal atrial fibrillation  Patient with history of afib, currently in sinus rhythm without observation of afib on telemetry. Continue Eliquis '5mg'$  BID.  Per primary team:  COPD: Patient with physical exam evidence of chronic bronchitis/mucus. Suspect that this is a significant component of her dyspnea as well.        For questions or updates, please contact [place] Please consult [email or web address] for contact info under        Signed, Lily Kocher, PA-C  09/25/2022, 7:42 AM    Patient seen and examined with Lily Kocher PA-C.  Agree as above, with the following exceptions and changes as noted below. Does not feel well, did not get to wear bipap last night, receiving breathing treatment. Gen: NAD, CV: RRR, Lungs: clear laterally, Abd: soft, Extrem: Warm, well perfused, 1+ edema with woody skin changes Neuro/Psych: alert and oriented x 3, normal mood and affect. All available labs, radiology testing, previous records reviewed. Needs continued diuresis, as well as respiratory cares. Continue lasix 60 mg TID.   Elouise Munroe, MD 09/25/22 10:36 AM

## 2022-09-26 DIAGNOSIS — J9621 Acute and chronic respiratory failure with hypoxia: Secondary | ICD-10-CM | POA: Diagnosis not present

## 2022-09-26 LAB — BASIC METABOLIC PANEL
Anion gap: 11 (ref 5–15)
BUN: 27 mg/dL — ABNORMAL HIGH (ref 6–20)
CO2: 44 mmol/L — ABNORMAL HIGH (ref 22–32)
Calcium: 8.8 mg/dL — ABNORMAL LOW (ref 8.9–10.3)
Chloride: 85 mmol/L — ABNORMAL LOW (ref 98–111)
Creatinine, Ser: 0.71 mg/dL (ref 0.44–1.00)
GFR, Estimated: >60 mL/min (ref >60)
Glucose, Bld: 83 mg/dL (ref 70–99)
Potassium: 3.9 mmol/L (ref 3.5–5.1)
Sodium: 140 mmol/L (ref 135–145)

## 2022-09-26 LAB — GLUCOSE, CAPILLARY
Glucose-Capillary: 105 mg/dL — ABNORMAL HIGH (ref 70–99)
Glucose-Capillary: 204 mg/dL — ABNORMAL HIGH (ref 70–99)
Glucose-Capillary: 258 mg/dL — ABNORMAL HIGH (ref 70–99)
Glucose-Capillary: 246 mg/dL — ABNORMAL HIGH (ref 70–99)

## 2022-09-26 LAB — MAGNESIUM: Magnesium: 2.3 mg/dL (ref 1.7–2.4)

## 2022-09-26 NOTE — Progress Notes (Signed)
Patient had multiple episodes of oxygen desaturation into the low 60's during coughing episodes and while drinking water.  Her recovery time from the low saturations took several minutes (3-5 min).  While sleeping she removes her oxygen from her nose also causing rapid desaturation of her oxygen levels.    Patient makes little to no attempt to assist in her own care.    She is at risk for failure to thrive, rapid deconditioning, and treatment failure.

## 2022-09-26 NOTE — Progress Notes (Signed)
Placed patient on bipap for the night with oxygen set at 10lpm

## 2022-09-26 NOTE — Progress Notes (Signed)
[facility] Progress Note Patient: Chelsea [name] NOM:[id number] DOB: Jun 22, 1966 DOA: 09/17/2022  DOS: the patient was seen and examined on 09/26/2022  Brief hospital course: Mrs. Penick is a 56 y.o. F with asthma/COPD, dCHF and OHS and hx tracheostomy now changed, MO with BMI 54, and chronic respiratory failure on 3L home O2, anemia, AF on eliquis, DM, HTN lives in ALF, who presented with .. dyspnea. 12/18: Started on steroids, diuresis 12/19: Cardiology consulted 12/23.  Hypoxia worsening.  Started on continuous BiPAP.  Patient has been refusing BiPAP therapy in the hospital. 12/26.  Initiated on Tussionex nightly for cough. Assessment and Plan: Acute on chronic hypoxic and hypercarbic respiratory failure COPD with acute exacerbation. Obesity hypoventilation syndrome. Patient is chronically on 4 L of oxygen (not 3). Patient is on BiPAP at home but refusing that therapy here in the hospital. Presents with complaints of shortness of breath and treated with IV steroids for COPD exacerbation with azithromycin and IV diuresis. Oxygenation improved compared to 10 L on 12/23 to 6 L on 12/24. Continue BiPAP nightly and as needed.  Emphasized the importance of compliance. Continue DuoNebs.  Taper prednisone off.  Added Tussionex nightly. Will monitor for now.  Acute on chronic diastolic CHF. Echocardiogram in October shows preserved EF and grade 2 diastolic and moderate aortic stenosis. Currently on IV Lasix. Will continue.  Left forearm and right leg cellulitis. Denies improving. Continue 5 days of doxycycline therapy.  Moderate aortic stenosis. Monitor for now.  Management per cardiology.  Paroxysmal A-fib. Currently rate controlled. Continue Eliquis.  Not on any rate control medication.  Acute on chronic iron deficiency anemia. H&H relatively stable.  Monitor.  Transfuse for hemoglobin less than 7.  Diabetes mellitus, uncontrolled with hyperglycemia without  long-term insulin use Hyperglycemia in the hospital most likely secondary to steroids. Recent hemoglobin A1c 5.2. Trending down from prior values which is excellent without any medication. For now we will monitor.  Morbid obesity. Body mass index is 51.76 kg/m.  Placing the patient at high risk of poor outcome.  Goals of care conversation. Provided education to the patient with regards to importance of pulmonary toilet, incentive spirometry as well as BiPAP use multiple times. Emphasized that she needs to be working with physical therapy and she needs to be spending more time upright in the chair or in the bed. Emphasized the importance of lateral position with regards to recruiting alveoli as well. Explained that with her noncompliance with BiPAP, incentive spirometry as well as spending most of the time in the bed she remains at very high risk for ICU transfer.  And if she gets intubated with her deconditioning her risk for tracheostomy is very high.   Subjective: No nausea,.  Had a BM.  No blood in the stool.  Physical Exam: General: Appear in mild distress; Cardiovascular: S1 and S2 Present, aortic systolic  Murmur, Respiratory: increased respiratory effort, Bilateral Air entry present, bilateral  Crackles, no wheezes Abdomen: Bowel Sound present, Non tender  Extremities: bilateral lymphedema, upper extremity edema Neurology: alert and oriented to time, place, and person   Data Reviewed: I have Reviewed nursing notes, Vitals, and Lab results. Since last encounter, pertinent lab results CBC and BMP   . I have ordered test including CBC and BMP  .       Disposition: Status is: Inpatient Remains inpatient appropriate because: Need for IV diuresis and BiPAP therapy  SCDs Start: 09/17/22 2119 apixaban (ELIQUIS) tablet 5 mg   Family Communication: No one  at bedside Level of care: Telemetry Cardiac Continue telemetry for CHF. Vitals:   09/26/22 1500 09/26/22 1528 09/26/22 1600  09/26/22 1700  BP:  135/81    Pulse: 73 83 70 79  Resp: 19 (!) 22 18 (!) 22  Temp:  98.8 F (37.1 C)    TempSrc:  Oral    SpO2: 98% 97% 92% 98%  Weight:      Height:         Author: Berle Mull, MD 09/26/2022 5:53 PM  Please look on [email or web address] to find out who is on call.

## 2022-09-26 NOTE — Progress Notes (Signed)
Physical Therapy Treatment Patient Details Name: Chelsea [name] MRN: [id number] DOB: [birth year] Today's Date: 09/26/2022   History of Present Illness Pt is a 56 y/o F admitted on 09/17/22 after presenting with c/o acute on chronic hypoxic respiratory failure 2/2 acute on chronic diastolic heart failure as well as suspected COPD exacerbation. PMH: chronic hypoxic respiratory failure on 4L continuous O2, chronic diastolic heart failure, COPD, obesity hypoventilation syndrome, OSA, moderate aortic stenosis, paroxysmal a-fib chronically anticoagulated, cervical CA, Hep C, gout, HLD, HTN, DM2   PT Comments    Patient agreeable to in bed exercises for strengthening. Sp02 briefly to 89% with exercises, however mostly remained in the low 90's with cues for breathing techniques. Patient's lunch tray arrived and she declined further intervention as she wanted to eat. Education provided for routine repositioning, tips for facilitating independence with rolling, importance for sitting head of bed up for eating for aspiration prevention and for upright conditioning. Recommend to continue PT to maximize independence and facilitate return to prior level function. SNF is recommended at discharge.    Recommendations for follow up therapy are one component of a multi-disciplinary discharge planning process, led by the attending physician.  Recommendations may be updated based on patient status, additional functional criteria and insurance authorization.  Follow Up Recommendations  Skilled nursing-short term rehab (<3 hours/day) Can patient physically be transported by private vehicle: No   Assistance Recommended at Discharge Frequent or constant Supervision/Assistance  Patient can return home with the following Two people to help with walking and/or transfers;Two people to help with bathing/dressing/bathroom   Equipment Recommendations  None recommended by PT    Recommendations for Other Services        Precautions / Restrictions Precautions Precautions: Fall Restrictions Weight Bearing Restrictions: No     Mobility  Bed Mobility               General bed mobility comments: Patient declined mobility as her lunch came during the session. Encouraged routine repositioning, reaching across midline for bed rails to facilitate independence with rolling, elevating head of bed for upright conditioning, etc. She verbalized understanding, however will likely need follow up education and encouragement. Bed repositioned in more of a chair position in preparation for eating lunch and encourage patient to sit as upright as possible while eating for aspiration prevention.    Transfers                   General transfer comment: Not assessed. Patient needs lift equipment at baseline for any OOB activity per her report.    Ambulation/Gait                   Stairs             Wheelchair Mobility    Modified Rankin (Stroke Patients Only)       Balance                                            Cognition Arousal/Alertness: Awake/alert Behavior During Therapy: WFL for tasks assessed/performed Overall Cognitive Status: Within Functional Limits for tasks assessed                                          Exercises General Exercises - Lower Extremity  Ankle Circles/Pumps: AROM, Strengthening, Both, 15 reps, Supine Heel Slides: AAROM, Strengthening, Both, 10 reps, Supine Other Exercises Other Exercises: towel squeeze x 10 reps BLE with AROM; encouraged proper breathing techniques during exercise. Sp02 briefly down to 89% with exercise, however mostly remains in the low 90's with activity.    General Comments        Pertinent Vitals/Pain Pain Assessment Pain Assessment: Faces Faces Pain Scale: Hurts little more Pain Location: bilateral legs (chronic) Pain Descriptors / Indicators: Discomfort Pain Intervention(s): Limited  activity within patient's tolerance, Monitored during session, Repositioned    Home Living                          Prior Function            PT Goals (current goals can now be found in the care plan section) Acute Rehab PT Goals Patient Stated Goal: to feel better    Frequency    Min 2X/week      PT Plan Current plan remains appropriate    Co-evaluation              AM-PAC PT "6 Clicks" Mobility   Outcome Measure  Help needed turning from your back to your side while in a flat bed without using bedrails?: Total Help needed moving from lying on your back to sitting on the side of a flat bed without using bedrails?: Total Help needed moving to and from a bed to a chair (including a wheelchair)?: Total Help needed standing up from a chair using your arms (e.g., wheelchair or bedside chair)?: Total Help needed to walk in hospital room?: Total Help needed climbing 3-5 steps with a railing? : Total 6 Click Score: 6    End of Session Equipment Utilized During Treatment: Oxygen Activity Tolerance: Patient limited by fatigue Patient left: in bed;with call bell/phone within reach;with bed alarm set (set-up with lunch tray) Nurse Communication: Mobility status (discussed with nurse aide) PT Visit Diagnosis: Muscle weakness (generalized) (M62.81);Difficulty in walking, not elsewhere classified (R26.2)     Time: 9381-8299 PT Time Calculation (min) (ACUTE ONLY): 8 min  Charges:  $Therapeutic Exercise: 8-22 mins                    Minna Merritts, PT, MPT    Percell Locus 09/26/2022, 1:17 PM

## 2022-09-26 NOTE — Progress Notes (Addendum)
Rounding Note    Patient Name: Chelsea [name] Date of Encounter: 09/26/2022  [facility] Cardiologist: Candee Furbish, MD   Subjective   Patient with cough productive of thick phlegm this morning. She appears to desat during these coughing spells. Reports that her breathing and overall condition feel improved today. Denies chest pain, palpitations.  Inpatient Medications    Scheduled Meds:  apixaban  5 mg Oral BID   budesonide  0.5 mg Inhalation BID   chlorpheniramine-HYDROcodone  5 mL Oral QHS   cyanocobalamin  1,000 mcg Oral Daily   dextromethorphan  30 mg Oral BID   doxycycline  100 mg Oral Q12H   empagliflozin  10 mg Oral Daily   ferrous sulfate  325 mg Oral Q breakfast   folic acid  1 mg Oral Daily   furosemide  60 mg Intravenous TID   gabapentin  400 mg Oral TID   insulin aspart  0-15 Units Subcutaneous TID WC   insulin aspart  0-5 Units Subcutaneous QHS   ipratropium-albuterol  3 mL Nebulization BID   levothyroxine  150 mcg Oral Q0600   loratadine  10 mg Oral Daily   pantoprazole  40 mg Oral Daily   predniSONE  10 mg Oral Q breakfast   spironolactone  12.5 mg Oral Daily   Continuous Infusions:  sodium chloride 10 mL/hr at 09/19/22 1758   PRN Meds: sodium chloride, acetaminophen **OR** acetaminophen, albuterol, diclofenac Sodium, guaiFENesin-dextromethorphan, HYDROcodone-acetaminophen, loperamide, LORazepam, melatonin, Muscle Rub, senna-docusate   Vital Signs    Vitals:   09/26/22 0400 09/26/22 0448 09/26/22 0500 09/26/22 0700  BP:  130/73    Pulse: 64 64 66 62  Resp: '17 19 19 19  '$ Temp:  98.2 F (36.8 C)    TempSrc:  Oral    SpO2: 98% 96%  99%  Weight:  (!) 141.1 kg    Height:        Intake/Output Summary (Last 24 hours) at 09/26/2022 0758 Last data filed at 09/26/2022 0449 Gross per 24 hour  Intake 500 ml  Output 3200 ml  Net -2700 ml      09/26/2022    4:48 AM 09/25/2022    3:47 AM 09/24/2022    6:25 AM  Last 3 Weights   Weight (lbs) 311 lb 1.1 oz 312 lb 2.7 oz 317 lb 7.4 oz  Weight (kg) 141.1 kg 141.6 kg 144 kg      Telemetry    Sinus rhythm - Personally Reviewed  ECG    No new tracing  Physical Exam   GEN: No acute distress.   Neck: Unable to assess with body habitus Cardiac: RRR, no murmurs, rubs, or gallops.  Respiratory: diminished bilateral lower lobes, bilateral inspiratory rhonchi GI: Soft, nontender, mild-moderate distention MS: Bilateral lower extremity edema, 2+. Cellulitic appearance to RLE. Neuro:  Nonfocal  Psych: Normal affect   Labs    High Sensitivity Troponin:   Recent Labs  Lab 09/17/22 1727 09/17/22 2119  TROPONINIHS 5 6     Chemistry Recent Labs  Lab 09/24/22 0049 09/25/22 0107 09/26/22 0239  NA 140 139 140  K 4.1 4.0 3.9  CL 88* 86* 85*  CO2 40* 43* 44*  GLUCOSE 220* 220* 83  BUN 32* 31* 27*  CREATININE 0.91 0.68 0.71  CALCIUM 8.3* 8.5* 8.8*  MG 2.2 2.3 2.3  GFRNONAA >60 >60 >60  ANIONGAP '12 10 11    '$ Lipids No results for input(s): "CHOL", "TRIG", "HDL", "LABVLDL", "LDLCALC", "CHOLHDL" in the  last 168 hours.  Hematology Recent Labs  Lab 09/23/22 0648 09/24/22 0049 09/25/22 0107  WBC 8.0 6.8 8.7  RBC 3.38* 3.46* 3.55*  HGB 8.7* 9.1* 9.2*  HCT 31.9* 31.8* 32.9*  MCV 94.4 91.9 92.7  MCH 25.7* 26.3 25.9*  MCHC 27.3* 28.6* 28.0*  RDW 15.3 15.1 15.0  PLT 254 247 321   Thyroid No results for input(s): "TSH", "FREET4" in the last 168 hours.  BNPNo results for input(s): "BNP", "PROBNP" in the last 168 hours.  DDimer No results for input(s): "DDIMER" in the last 168 hours.   Radiology    No results found.  Cardiac Studies   07/30/22 TTE   IMPRESSIONS     1. Left ventricular ejection fraction, by estimation, is 60 to 65%. The  left ventricle has normal function. The left ventricle has no regional  wall motion abnormalities. There is mild left ventricular hypertrophy.  Left ventricular diastolic parameters  are consistent with Grade II  diastolic dysfunction (pseudonormalization).  Elevated left ventricular end-diastolic pressure.   2. Right ventricular systolic function is normal. The right ventricular  size is mildly enlarged. Tricuspid regurgitation signal is inadequate for  assessing PA pressure.   3. Left atrial size was severely dilated.   4. The mitral valve is degenerative. Trivial mitral valve regurgitation.  No evidence of mitral stenosis.   5. The aortic valve is abnormal. Unable to determine aortic valve  morphology due to image quality. There is moderate calcification of the  aortic valve. Aortic valve regurgitation is trivial. Moderate aortic valve  stenosis. Aortic valve area, by VTI  measures 1.16 cm. Aortic valve mean gradient measures 31.0 mmHg. Aortic  valve Vmax measures 3.58 m/s.   6. The inferior vena cava is dilated in size with >50% respiratory  variability, suggesting right atrial pressure of 8 mmHg.   7. Increased flow velocities may be secondary to anemia, thyrotoxicosis,  hyperdynamic or high flow state.   FINDINGS   Left Ventricle: Left ventricular ejection fraction, by estimation, is 60  to 65%. The left ventricle has normal function. The left ventricle has no  regional wall motion abnormalities. The left ventricular internal cavity  size was normal in size. There is   mild left ventricular hypertrophy. Left ventricular diastolic parameters  are consistent with Grade II diastolic dysfunction (pseudonormalization).  Elevated left ventricular end-diastolic pressure.   Right Ventricle: The right ventricular size is mildly enlarged. No  increase in right ventricular wall thickness. Right ventricular systolic  function is normal. Tricuspid regurgitation signal is inadequate for  assessing PA pressure.   Left Atrium: Left atrial size was severely dilated.   Right Atrium: Right atrial size was normal in size.   Pericardium: There is no evidence of pericardial effusion.   Mitral Valve:  The mitral valve is degenerative in appearance. Mild mitral  annular calcification. Trivial mitral valve regurgitation. No evidence of  mitral valve stenosis.   Tricuspid Valve: The tricuspid valve is normal in structure. Tricuspid  valve regurgitation is trivial. No evidence of tricuspid stenosis.   Aortic Valve: The aortic valve is abnormal. There is moderate  calcification of the aortic valve. Aortic valve regurgitation is trivial.  Moderate aortic stenosis is present. Aortic valve mean gradient measures  31.0 mmHg. Aortic valve peak gradient  measures 51.3 mmHg. Aortic valve area, by VTI measures 1.16 cm.   Pulmonic Valve: The pulmonic valve was normal in structure. Pulmonic valve  regurgitation is trivial. No evidence of pulmonic stenosis.   Aorta: The  aortic root is normal in size and structure.   Venous: The inferior vena cava is dilated in size with greater than 50%  respiratory variability, suggesting right atrial pressure of 8 mmHg.   IAS/Shunts: The atrial septum is grossly normal.   Patient Profile     Chelsea [name] is a 56 y.o. female with a hx of chronic COPD who is being seen 09/18/2022 for the evaluation of shortness of breath with moderate aortic stenosis at the request of Dr. Waldron Labs.     Assessment & Plan    Acute on chronic HFpEF   Patient with LVEF 60-65% and grade II diastolic dysfunction on 67/67/20 TTE. Admitted in the setting of acute shortness of breath, found to be volume up with BNP 143.7. Has been receiving IV lasix, initially '80mg'$  BID from 12/18-12/23, now '60mg'$  TID. Net output this admission is -18.2 L. Creatinine/BUN remain stable.    Continue IV diuresis today. Would transition to oral dose once creatinine/BUN show evidence that patient is dry, suspect she may respond better to Torsemide, '40mg'$  BID (double admission dose). Continue Spironolactone 12.'5mg'$  QD.  Continue Jardiance    Moderate aortic stenosis   Patient with systolic murmur on  physical exam, TTE showing moderate AS with peak gradient ~30. Patient should have ongoing outpatient follow up.   Paroxysmal atrial fibrillation   Patient with history of afib, currently in sinus rhythm without observation of afib on telemetry. Continue Eliquis '5mg'$  BID.   Per primary team:   COPD: Patient with physical exam evidence of chronic bronchitis/mucus. Suspect that this is a significant component of her dyspnea as well.         For questions or updates, please contact [place] Please consult [email or web address] for contact info under        Signed, Lily Kocher, PA-C  09/26/2022, 7:58 AM    Patient seen and examined with Lily Kocher PA-C.  Agree as above, with the following exceptions and changes as noted below. Feeling slightly better, continues to diurese. Gen: NAD, CV: RRR, 2/6 SEM, Lungs: clear, Abd: soft, Extrem: Warm, well perfused, 2+ edema, Neuro/Psych: alert and oriented x 3, normal mood and affect. All available labs, radiology testing, previous records reviewed. Continue diuresis as above.   Elouise Munroe, MD 09/26/22 11:04 AM

## 2022-09-27 ENCOUNTER — Other Ambulatory Visit (HOSPITAL_COMMUNITY): Payer: Self-pay

## 2022-09-27 DIAGNOSIS — J9621 Acute and chronic respiratory failure with hypoxia: Secondary | ICD-10-CM | POA: Diagnosis not present

## 2022-09-27 LAB — GLUCOSE, CAPILLARY
Glucose-Capillary: 120 mg/dL — ABNORMAL HIGH (ref 70–99)
Glucose-Capillary: 254 mg/dL — ABNORMAL HIGH (ref 70–99)
Glucose-Capillary: 153 mg/dL — ABNORMAL HIGH (ref 70–99)
Glucose-Capillary: 203 mg/dL — ABNORMAL HIGH (ref 70–99)

## 2022-09-27 NOTE — Inpatient Diabetes Management (Signed)
Inpatient Diabetes Program Recommendations  AACE/ADA: New Consensus Statement on Inpatient Glycemic Control (2015)  Target Ranges:  Prepandial:   less than 140 mg/dL      Peak postprandial:   less than 180 mg/dL (1-2 hours)      Critically ill patients:  140 - 180 mg/dL   Lab Results  Component Value Date   GLUCAP 254 (H) 09/27/2022   HGBA1C 6.1 (H) 09/19/2022    Latest Reference Range & Units 09/26/22 11:26 09/26/22 15:35 09/26/22 21:11 09/27/22 06:07 09/27/22 11:21  Glucose-Capillary 70 - 99 mg/dL 204 (H) 258 (H) 246 (H) 120 (H) 254 (H)  (H): Data is abnormally high Review of Glycemic Control  Diabetes history: type 2 Outpatient Diabetes medications: Novolog 0-24 units correction scale TID Current orders for Inpatient glycemic control: Novolog 0-15 units TID correction scale, Novolog 0-5 units scale at HS, Jardiance 10 mg daily  Inpatient Diabetes Program Recommendations:   Noted that CBGs have been greater than 200 mg/dl today. Recommend adding Novolog 3 units TID with meals if patient eats at least 50% of meal and blood sugars continue to be elevated.   Harvel Ricks RN BSN CDE Diabetes Coordinator Pager: [phone]  8am-5pm

## 2022-09-27 NOTE — TOC Benefit Eligibility Note (Signed)
Patient Teacher, English as a foreign language completed.    The patient is currently admitted and upon discharge could be taking Jardiance 10 mg.  The current 30 day co-pay is $0.00.   The patient is insured through [place], [place] Patient Advocate Specialist [place] Patient Advocate Team Direct Number: [phone]  Fax: [phone]

## 2022-09-27 NOTE — Progress Notes (Addendum)
Rounding Note    Patient Name: Chelsea [name] Date of Encounter: 09/27/2022  [facility] Cardiologist: Candee Furbish, MD   Subjective   Patient reports improvement in breathing and decreased pulmonary mucus today though she does continue to produce thick phlegm with coughing. She is otherwise without complaints of chest pain, palpitations.  Inpatient Medications    Scheduled Meds:  apixaban  5 mg Oral BID   budesonide  0.5 mg Inhalation BID   chlorpheniramine-HYDROcodone  5 mL Oral QHS   cyanocobalamin  1,000 mcg Oral Daily   dextromethorphan  30 mg Oral BID   doxycycline  100 mg Oral Q12H   empagliflozin  10 mg Oral Daily   ferrous sulfate  325 mg Oral Q breakfast   folic acid  1 mg Oral Daily   furosemide  60 mg Intravenous TID   gabapentin  400 mg Oral TID   insulin aspart  0-15 Units Subcutaneous TID WC   insulin aspart  0-5 Units Subcutaneous QHS   ipratropium-albuterol  3 mL Nebulization BID   levothyroxine  150 mcg Oral Q0600   loratadine  10 mg Oral Daily   pantoprazole  40 mg Oral Daily   spironolactone  12.5 mg Oral Daily   Continuous Infusions:  sodium chloride 10 mL/hr at 09/19/22 1758   PRN Meds: sodium chloride, acetaminophen **OR** acetaminophen, albuterol, diclofenac Sodium, guaiFENesin-dextromethorphan, HYDROcodone-acetaminophen, loperamide, LORazepam, melatonin, Muscle Rub, senna-docusate   Vital Signs    Vitals:   09/26/22 1944 09/26/22 1950 09/26/22 2339 09/27/22 0441  BP:    (!) 129/96  Pulse:   89 72  Resp:   19 20  Temp:    98 F (36.7 C)  TempSrc:    Axillary  SpO2: 90% 92% 94% 94%  Weight:    (!) 139.6 kg  Height:        Intake/Output Summary (Last 24 hours) at 09/27/2022 0745 Last data filed at 09/27/2022 0446 Gross per 24 hour  Intake 960 ml  Output 3850 ml  Net -2890 ml      09/27/2022    4:41 AM 09/26/2022    4:48 AM 09/25/2022    3:47 AM  Last 3 Weights  Weight (lbs) 307 lb 12.2 oz 311 lb 1.1 oz 312  lb 2.7 oz  Weight (kg) 139.6 kg 141.1 kg 141.6 kg      Telemetry    Sinus rhythm - Personally Reviewed  ECG    No new tracing  Physical Exam   GEN: No acute distress.   Neck: Unable to assess Cardiac: RRR, No rubs, or gallops. Systolic murmur noted LUSB Respiratory: bibasilar rhonchi, diminished sounds in bilateral bases. GI: Soft, nontender, mild-moderate distention MS: 2+ bilateral lower extremity edema. RLE continues to appear cellulitic. Neuro:  Nonfocal  Psych: Normal affect   Labs    High Sensitivity Troponin:   Recent Labs  Lab 09/17/22 1727 09/17/22 2119  TROPONINIHS 5 6     Chemistry Recent Labs  Lab 09/24/22 0049 09/25/22 0107 09/26/22 0239  NA 140 139 140  K 4.1 4.0 3.9  CL 88* 86* 85*  CO2 40* 43* 44*  GLUCOSE 220* 220* 83  BUN 32* 31* 27*  CREATININE 0.91 0.68 0.71  CALCIUM 8.3* 8.5* 8.8*  MG 2.2 2.3 2.3  GFRNONAA >60 >60 >60  ANIONGAP '12 10 11    '$ Lipids No results for input(s): "CHOL", "TRIG", "HDL", "LABVLDL", "LDLCALC", "CHOLHDL" in the last 168 hours.  Hematology Recent Labs  Lab 09/23/22  2703 09/24/22 0049 09/25/22 0107  WBC 8.0 6.8 8.7  RBC 3.38* 3.46* 3.55*  HGB 8.7* 9.1* 9.2*  HCT 31.9* 31.8* 32.9*  MCV 94.4 91.9 92.7  MCH 25.7* 26.3 25.9*  MCHC 27.3* 28.6* 28.0*  RDW 15.3 15.1 15.0  PLT 254 247 321   Thyroid No results for input(s): "TSH", "FREET4" in the last 168 hours.  BNPNo results for input(s): "BNP", "PROBNP" in the last 168 hours.  DDimer No results for input(s): "DDIMER" in the last 168 hours.   Radiology    No results found.  Cardiac Studies   07/30/22 TTE   IMPRESSIONS     1. Left ventricular ejection fraction, by estimation, is 60 to 65%. The  left ventricle has normal function. The left ventricle has no regional  wall motion abnormalities. There is mild left ventricular hypertrophy.  Left ventricular diastolic parameters  are consistent with Grade II diastolic dysfunction (pseudonormalization).   Elevated left ventricular end-diastolic pressure.   2. Right ventricular systolic function is normal. The right ventricular  size is mildly enlarged. Tricuspid regurgitation signal is inadequate for  assessing PA pressure.   3. Left atrial size was severely dilated.   4. The mitral valve is degenerative. Trivial mitral valve regurgitation.  No evidence of mitral stenosis.   5. The aortic valve is abnormal. Unable to determine aortic valve  morphology due to image quality. There is moderate calcification of the  aortic valve. Aortic valve regurgitation is trivial. Moderate aortic valve  stenosis. Aortic valve area, by VTI  measures 1.16 cm. Aortic valve mean gradient measures 31.0 mmHg. Aortic  valve Vmax measures 3.58 m/s.   6. The inferior vena cava is dilated in size with >50% respiratory  variability, suggesting right atrial pressure of 8 mmHg.   7. Increased flow velocities may be secondary to anemia, thyrotoxicosis,  hyperdynamic or high flow state.   FINDINGS   Left Ventricle: Left ventricular ejection fraction, by estimation, is 60  to 65%. The left ventricle has normal function. The left ventricle has no  regional wall motion abnormalities. The left ventricular internal cavity  size was normal in size. There is   mild left ventricular hypertrophy. Left ventricular diastolic parameters  are consistent with Grade II diastolic dysfunction (pseudonormalization).  Elevated left ventricular end-diastolic pressure.   Right Ventricle: The right ventricular size is mildly enlarged. No  increase in right ventricular wall thickness. Right ventricular systolic  function is normal. Tricuspid regurgitation signal is inadequate for  assessing PA pressure.   Left Atrium: Left atrial size was severely dilated.   Right Atrium: Right atrial size was normal in size.   Pericardium: There is no evidence of pericardial effusion.   Mitral Valve: The mitral valve is degenerative in  appearance. Mild mitral  annular calcification. Trivial mitral valve regurgitation. No evidence of  mitral valve stenosis.   Tricuspid Valve: The tricuspid valve is normal in structure. Tricuspid  valve regurgitation is trivial. No evidence of tricuspid stenosis.   Aortic Valve: The aortic valve is abnormal. There is moderate  calcification of the aortic valve. Aortic valve regurgitation is trivial.  Moderate aortic stenosis is present. Aortic valve mean gradient measures  31.0 mmHg. Aortic valve peak gradient  measures 51.3 mmHg. Aortic valve area, by VTI measures 1.16 cm.   Pulmonic Valve: The pulmonic valve was normal in structure. Pulmonic valve  regurgitation is trivial. No evidence of pulmonic stenosis.   Aorta: The aortic root is normal in size and structure.  Venous: The inferior vena cava is dilated in size with greater than 50%  respiratory variability, suggesting right atrial pressure of 8 mmHg.   IAS/Shunts: The atrial septum is grossly normal.   Patient Profile     Chelsea [name] is a 56 y.o. female with a hx of chronic COPD who is being seen 09/18/2022 for the evaluation of shortness of breath with moderate aortic stenosis at the request of Dr. Waldron Labs.    Assessment & Plan    Acute on chronic HFpEF   Patient with LVEF 60-65% and grade II diastolic dysfunction on 27/25/36 TTE. Admitted in the setting of acute shortness of breath, found to be volume up with BNP 143.7. Has been receiving IV lasix, initially '80mg'$  BID from 12/18-12/23, now '60mg'$  TID. Net output this admission is -21.1 L. Creatinine/BUN have not yet resulted this morning.   Continue IV diuresis today ('60mg'$  TID) if BUN/Creatinine remain stable.  Suspect she may respond better to Torsemide, '40mg'$  BID (double admission dose). Continue Spironolactone 12.'5mg'$  QD.  Continue Jardiance    Moderate aortic stenosis   Patient with systolic murmur on physical exam, TTE showing moderate AS with peak  gradient ~30. Patient should have ongoing outpatient follow up.   Paroxysmal atrial fibrillation   Patient with history of afib, currently in sinus rhythm without observation of afib on telemetry. Continue Eliquis '5mg'$  BID.   Per primary team:   COPD: Patient with physical exam evidence of chronic bronchitis/mucus. Suspect that this is a significant component of her dyspnea as well.        For questions or updates, please contact [place] Please consult [email or web address] for contact info under        Signed, Lily Kocher, PA-C  09/27/2022, 7:45 AM    Patient seen and examined with EW PA-C.  Agree as above, with the following exceptions and changes as noted below. Sleeping on exam, no morning labs available for review. Gen: NAD, CV: RRR, no murmurs, Lungs: clear, Abd: soft, Extrem: Warm, well perfused, 2+ edema, Neuro/Psych: alert and oriented x 3, normal mood and affect. All available labs, radiology testing, previous records reviewed. Recommend continued diuresis if labs stable. No new cardiovascular recommendations.   Elouise Munroe, MD 09/27/22 10:39 AM

## 2022-09-27 NOTE — Progress Notes (Signed)
Pt. Refused labs this am. States she want them to be drawn later.

## 2022-09-27 NOTE — Progress Notes (Signed)
[facility] Progress Note Patient: Chelsea [name]:[id number] DOB: 01-24-1966 DOA: 09/17/2022  DOS: the patient was seen and examined on 09/27/2022  Brief hospital course: Chelsea [name] is a 56 y.o. F with asthma/COPD, dCHF and OHS and hx tracheostomy now changed, MO with BMI 54, and chronic respiratory failure on 3L home O2, anemia, AF on eliquis, DM, HTN lives in ALF, who presented with .. dyspnea. 12/18: Started on steroids, diuresis 12/19: Cardiology consulted 12/23.  Hypoxia worsening.  Started on continuous BiPAP.  Patient has been refusing BiPAP therapy in the hospital. 12/26.  Initiated on Tussionex nightly for cough. Assessment and Plan: Acute on chronic hypoxic and hypercarbic respiratory failure COPD with acute exacerbation. Obesity hypoventilation syndrome. Patient is chronically on 4 L of oxygen (not 3). Patient is on BiPAP at home but refusing that therapy here in the hospital. Presents with complaints of shortness of breath and treated with IV steroids for COPD exacerbation with azithromycin and IV diuresis. Oxygenation improved compared to 10 L on 12/23 to 6 L on 12/24. Continue BiPAP nightly and as needed.  Emphasized the importance of compliance. Continue DuoNebs.  Taper prednisone off.  Added Tussionex nightly. Will monitor for now.  Acute on chronic diastolic CHF. Echocardiogram in October shows preserved EF and grade 2 diastolic and moderate aortic stenosis. Currently on IV Lasix. Will continue.  Left forearm and right leg cellulitis. Denies improving. Completed 5 days of doxycycline therapy.  Moderate aortic stenosis. Monitor for now.  Management per cardiology.  Paroxysmal A-fib. Currently rate controlled. Continue Eliquis.  Not on any rate control medication.  Acute on chronic iron deficiency anemia. H&H relatively stable.  Monitor.  Transfuse for hemoglobin less than 7.  Diabetes mellitus, uncontrolled with hyperglycemia without  long-term insulin use Hyperglycemia in the hospital most likely secondary to steroids. Recent hemoglobin A1c 5.2. Trending down from prior values which is excellent without any medication. For now we will monitor.  Morbid obesity. Body mass index is 51.21 kg/m.  Placing the patient at high risk of poor outcome.  Goals of care conversation. Provided education to the patient with regards to importance of pulmonary toilet, incentive spirometry as well as BiPAP use multiple times. Emphasized that she needs to be working with physical therapy and she needs to be spending more time upright in the chair or in the bed. Emphasized the importance of lateral position with regards to recruiting alveoli as well. Explained that with her noncompliance with BiPAP, incentive spirometry as well as spending most of the time in the bed she remains at very high risk for ICU transfer.  And if she gets intubated with her deconditioning her risk for tracheostomy is very high.   Subjective: Denies any acute complaint.  Continues to have shortness of breath.  No nausea no vomiting no fever no chills.  Continues to have cough.  Physical Exam: General: Appear in mild distress; Cardiovascular: S1 and S2 Present, aortic systolic  Murmur, Respiratory: increased respiratory effort, Bilateral Air entry present, basal Crackles, no wheezes Abdomen: Bowel Sound present, Non tender  Extremities: Bilateral lymphedema, bilateral upper extremity edema Neurology: alert and oriented to time, place, and person   Data Reviewed: I have Reviewed nursing notes, Vitals, and Lab results. Since last encounter, pertinent lab results CBC and BMP   . I have ordered test including BMP and magnesium  . I have discussed pt's care plan and test results with cardiology  .        Disposition: Status is: Inpatient  Remains inpatient appropriate because: Need for IV diuresis and BiPAP therapy  SCDs Start: 09/17/22 2119 apixaban (ELIQUIS)  tablet 5 mg   Family Communication: No one at bedside Level of care: Telemetry Cardiac Continue telemetry for CHF. Vitals:   09/26/22 1950 09/26/22 2339 09/27/22 0441 09/27/22 0813  BP:   (!) 129/96   Pulse:  89 72   Resp:  19 20   Temp:   98 F (36.7 C)   TempSrc:   Axillary   SpO2: 92% 94% 94% 92%  Weight:   (!) 139.6 kg   Height:         Author: Berle Mull, MD 09/27/2022 6:11 PM  Please look on [email or web address] to find out who is on call.

## 2022-09-27 NOTE — Plan of Care (Signed)
  Problem: Fluid Volume: Goal: Ability to maintain a balanced intake and output will improve Outcome: Not Progressing   Problem: Activity: Goal: Risk for activity intolerance will decrease Outcome: Not Progressing

## 2022-09-28 DIAGNOSIS — J9621 Acute and chronic respiratory failure with hypoxia: Secondary | ICD-10-CM | POA: Diagnosis not present

## 2022-09-28 DIAGNOSIS — I5033 Acute on chronic diastolic (congestive) heart failure: Secondary | ICD-10-CM | POA: Diagnosis not present

## 2022-09-28 DIAGNOSIS — E873 Alkalosis: Secondary | ICD-10-CM

## 2022-09-28 LAB — GLUCOSE, CAPILLARY
Glucose-Capillary: 131 mg/dL — ABNORMAL HIGH (ref 70–99)
Glucose-Capillary: 248 mg/dL — ABNORMAL HIGH (ref 70–99)
Glucose-Capillary: 175 mg/dL — ABNORMAL HIGH (ref 70–99)
Glucose-Capillary: 205 mg/dL — ABNORMAL HIGH (ref 70–99)

## 2022-09-28 LAB — MAGNESIUM: Magnesium: 2.3 mg/dL (ref 1.7–2.4)

## 2022-09-28 LAB — CBC
HCT: 37.8 % (ref 36.0–46.0)
Hemoglobin: 10.6 g/dL — ABNORMAL LOW (ref 12.0–15.0)
MCH: 25.1 pg — ABNORMAL LOW (ref 26.0–34.0)
MCHC: 28 g/dL — ABNORMAL LOW (ref 30.0–36.0)
MCV: 89.6 fL (ref 80.0–100.0)
Platelets: 312 10*3/uL (ref 150–400)
RBC: 4.22 MIL/uL (ref 3.87–5.11)
RDW: 14.9 % (ref 11.5–15.5)
WBC: 7.8 10*3/uL (ref 4.0–10.5)
nRBC: 0 % (ref 0.0–0.2)

## 2022-09-28 LAB — BASIC METABOLIC PANEL
BUN: 29 mg/dL — ABNORMAL HIGH (ref 6–20)
CO2: >45 mmol/L — ABNORMAL HIGH (ref 22–32)
Calcium: 9 mg/dL (ref 8.9–10.3)
Chloride: 82 mmol/L — ABNORMAL LOW (ref 98–111)
Creatinine, Ser: 0.76 mg/dL (ref 0.44–1.00)
GFR, Estimated: >60 mL/min (ref >60)
Glucose, Bld: 159 mg/dL — ABNORMAL HIGH (ref 70–99)
Potassium: 4 mmol/L (ref 3.5–5.1)
Sodium: 139 mmol/L (ref 135–145)

## 2022-09-28 LAB — BLOOD GAS, VENOUS
Acid-Base Excess: 30 mmol/L — ABNORMAL HIGH (ref 0.0–2.0)
Bicarbonate: 60.5 mmol/L — ABNORMAL HIGH (ref 20.0–28.0)
O2 Saturation: 88.2 %
Patient temperature: 37
pCO2, Ven: 87 mmHg (ref 44–60)
pH, Ven: 7.45 — ABNORMAL HIGH (ref 7.25–7.43)
pO2, Ven: 58 mmHg — ABNORMAL HIGH (ref 32–45)

## 2022-09-28 MED ORDER — ACETAZOLAMIDE 250 MG PO TABS
250.0000 mg | ORAL_TABLET | Freq: Three times a day (TID) | ORAL | Status: DC
  Administered 2022-09-28 – 2022-09-29 (×5): 250 mg via ORAL
  Filled 2022-09-28 (×5): qty 1

## 2022-09-28 NOTE — Progress Notes (Signed)
RN stated he could place pt on bipap when ready for bed.

## 2022-09-28 NOTE — Progress Notes (Addendum)
Rounding Note    Patient Name: Chelsea [name] Date of Encounter: 09/28/2022  [facility] Cardiologist: Candee Furbish, MD   Subjective   Patient continues to reports slow improvement in breathing/shortness of breath. She is without chest pain, palpitations, lightheadedness/dizziness.  Inpatient Medications    Scheduled Meds:  acetaZOLAMIDE  250 mg Oral TID WC   apixaban  5 mg Oral BID   budesonide  0.5 mg Inhalation BID   chlorpheniramine-HYDROcodone  5 mL Oral QHS   cyanocobalamin  1,000 mcg Oral Daily   dextromethorphan  30 mg Oral BID   empagliflozin  10 mg Oral Daily   ferrous sulfate  325 mg Oral Q breakfast   folic acid  1 mg Oral Daily   furosemide  60 mg Intravenous TID   gabapentin  400 mg Oral TID   insulin aspart  0-15 Units Subcutaneous TID WC   insulin aspart  0-5 Units Subcutaneous QHS   ipratropium-albuterol  3 mL Nebulization BID   levothyroxine  150 mcg Oral Q0600   loratadine  10 mg Oral Daily   pantoprazole  40 mg Oral Daily   spironolactone  12.5 mg Oral Daily   Continuous Infusions:  sodium chloride 10 mL/hr at 09/19/22 1758   PRN Meds: sodium chloride, acetaminophen **OR** acetaminophen, albuterol, diclofenac Sodium, guaiFENesin-dextromethorphan, HYDROcodone-acetaminophen, loperamide, LORazepam, melatonin, Muscle Rub, senna-docusate   Vital Signs    Vitals:   09/28/22 0058 09/28/22 0102 09/28/22 0625 09/28/22 0841  BP: (!) 88/57 (!) 91/57    Pulse: 66 71  67  Resp: 20   16  Temp:      TempSrc:      SpO2:    97%  Weight:   (!) 139.6 kg   Height:        Intake/Output Summary (Last 24 hours) at 09/28/2022 0925 Last data filed at 09/28/2022 0300 Gross per 24 hour  Intake 240 ml  Output 3300 ml  Net -3060 ml      09/28/2022    6:25 AM 09/27/2022    4:41 AM 09/26/2022    4:48 AM  Last 3 Weights  Weight (lbs) 307 lb 12.2 oz 307 lb 12.2 oz 311 lb 1.1 oz  Weight (kg) 139.6 kg 139.6 kg 141.1 kg      Telemetry     Sinus rhythm - Personally Reviewed  ECG    No new tracing  Physical Exam   GEN: No acute distress.   Neck: unable to assess for JVD due to body habitus Cardiac: RRR, no rubs, or gallops. Systolic murmur LUSB Respiratory: Diffuse rhonchi bilaterally, better after coughing. Quiet upper lobe wheezing R>L. Overall improved air movement. GI: Soft, nontender, mild to moderate distension MS: 2+ bilateral lower extremity edema (improved). RLE cellulitis appears more red today Neuro:  Nonfocal  Psych: Normal affect   Labs    High Sensitivity Troponin:   Recent Labs  Lab 09/17/22 1727 09/17/22 2119  TROPONINIHS 5 6     Chemistry Recent Labs  Lab 09/25/22 0107 09/26/22 0239 09/28/22 0159  NA 139 140 139  K 4.0 3.9 4.0  CL 86* 85* 82*  CO2 43* 44* >45*  GLUCOSE 220* 83 159*  BUN 31* 27* 29*  CREATININE 0.68 0.71 0.76  CALCIUM 8.5* 8.8* 9.0  MG 2.3 2.3 2.3  GFRNONAA >60 >60 >60  ANIONGAP 10 11 NOT CALCULATED    Lipids No results for input(s): "CHOL", "TRIG", "HDL", "LABVLDL", "LDLCALC", "CHOLHDL" in the last 168 hours.  Hematology  Recent Labs  Lab 09/24/22 0049 09/25/22 0107 09/28/22 0159  WBC 6.8 8.7 7.8  RBC 3.46* 3.55* 4.22  HGB 9.1* 9.2* 10.6*  HCT 31.8* 32.9* 37.8  MCV 91.9 92.7 89.6  MCH 26.3 25.9* 25.1*  MCHC 28.6* 28.0* 28.0*  RDW 15.1 15.0 14.9  PLT 247 321 312   Thyroid No results for input(s): "TSH", "FREET4" in the last 168 hours.  BNPNo results for input(s): "BNP", "PROBNP" in the last 168 hours.  DDimer No results for input(s): "DDIMER" in the last 168 hours.   Radiology    No results found.  Cardiac Studies   07/30/22 TTE   IMPRESSIONS     1. Left ventricular ejection fraction, by estimation, is 60 to 65%. The  left ventricle has normal function. The left ventricle has no regional  wall motion abnormalities. There is mild left ventricular hypertrophy.  Left ventricular diastolic parameters  are consistent with Grade II diastolic  dysfunction (pseudonormalization).  Elevated left ventricular end-diastolic pressure.   2. Right ventricular systolic function is normal. The right ventricular  size is mildly enlarged. Tricuspid regurgitation signal is inadequate for  assessing PA pressure.   3. Left atrial size was severely dilated.   4. The mitral valve is degenerative. Trivial mitral valve regurgitation.  No evidence of mitral stenosis.   5. The aortic valve is abnormal. Unable to determine aortic valve  morphology due to image quality. There is moderate calcification of the  aortic valve. Aortic valve regurgitation is trivial. Moderate aortic valve  stenosis. Aortic valve area, by VTI  measures 1.16 cm. Aortic valve mean gradient measures 31.0 mmHg. Aortic  valve Vmax measures 3.58 m/s.   6. The inferior vena cava is dilated in size with >50% respiratory  variability, suggesting right atrial pressure of 8 mmHg.   7. Increased flow velocities may be secondary to anemia, thyrotoxicosis,  hyperdynamic or high flow state.   FINDINGS   Left Ventricle: Left ventricular ejection fraction, by estimation, is 60  to 65%. The left ventricle has normal function. The left ventricle has no  regional wall motion abnormalities. The left ventricular internal cavity  size was normal in size. There is   mild left ventricular hypertrophy. Left ventricular diastolic parameters  are consistent with Grade II diastolic dysfunction (pseudonormalization).  Elevated left ventricular end-diastolic pressure.   Right Ventricle: The right ventricular size is mildly enlarged. No  increase in right ventricular wall thickness. Right ventricular systolic  function is normal. Tricuspid regurgitation signal is inadequate for  assessing PA pressure.   Left Atrium: Left atrial size was severely dilated.   Right Atrium: Right atrial size was normal in size.   Pericardium: There is no evidence of pericardial effusion.   Mitral Valve: The mitral  valve is degenerative in appearance. Mild mitral  annular calcification. Trivial mitral valve regurgitation. No evidence of  mitral valve stenosis.   Tricuspid Valve: The tricuspid valve is normal in structure. Tricuspid  valve regurgitation is trivial. No evidence of tricuspid stenosis.   Aortic Valve: The aortic valve is abnormal. There is moderate  calcification of the aortic valve. Aortic valve regurgitation is trivial.  Moderate aortic stenosis is present. Aortic valve mean gradient measures  31.0 mmHg. Aortic valve peak gradient  measures 51.3 mmHg. Aortic valve area, by VTI measures 1.16 cm.   Pulmonic Valve: The pulmonic valve was normal in structure. Pulmonic valve  regurgitation is trivial. No evidence of pulmonic stenosis.   Aorta: The aortic root is normal in  size and structure.   Venous: The inferior vena cava is dilated in size with greater than 50%  respiratory variability, suggesting right atrial pressure of 8 mmHg.   IAS/Shunts: The atrial septum is grossly normal.   Patient Profile     Chelsea [name] is a 56 y.o. female with a hx of chronic COPD who is being seen 09/18/2022 for the evaluation of shortness of breath with moderate aortic stenosis at the request of Dr. Waldron Labs.     Assessment & Plan    Acute on chronic HFpEF   Patient with LVEF 60-65% and grade II diastolic dysfunction on 56/81/27 TTE. Admitted in the setting of acute shortness of breath, found to be volume up with BNP 143.7. Has been receiving IV lasix, initially '80mg'$  BID from 12/18-12/23, now '60mg'$  TID. Net output this admission is -21.2 L. Weight down 149kg to 139.6 kg and clinically she appears to be improving with better air movement on pulmonary exam.   Continue IV diuresis today ('60mg'$  TID), BUN/Creatinine remain stable.  Suspect she may respond better to Torsemide when transitioned to PO, '40mg'$  BID (double admission dose). Continue Spironolactone 12.'5mg'$  QD.  Continue Jardiance     Moderate aortic stenosis   Patient with systolic murmur on physical exam, TTE showing moderate AS with peak gradient ~30. Patient should have ongoing outpatient follow up.   Paroxysmal atrial fibrillation   Patient with history of afib, currently in sinus rhythm without observation of afib on telemetry. Continue Eliquis '5mg'$  BID.   Per primary team:   COPD: Patient with physical exam evidence of chronic bronchitis/mucus. Suspect that this is a significant component of her dyspnea as well.      For questions or updates, please contact [facility] Please consult [email or web address] for contact info under        Signed, Lily Kocher, PA-C  09/28/2022, 9:25 AM    Patient seen and examined with Lily Kocher, PA-C.  Agree as above, with the following exceptions and changes as noted below.  Patient feeling slightly better today from a volume standpoint but continues to have productive phlegm which is bothersome. Gen: NAD, CV: RRR, no murmurs, Lungs: clear, Abd: soft, Extrem: 2+ but improved, woody skin change of the left foot, right lower extremity erythematous.  Neuro/Psych: alert and oriented x 3, normal mood and affect. All available labs, radiology testing, previous records reviewed.  Agree with acetazolamide for probable component of contraction alkalosis with diuresis.  Continues to aggressively diurese and is clinically improving.  Elouise Munroe, MD 09/28/22 11:26 AM

## 2022-09-28 NOTE — Progress Notes (Signed)
This am CO2 level greater than 45, hx of OSA on c-pap at night on call  MD notified, will continue to monitor, Thanks Arvella Nigh RN.

## 2022-09-28 NOTE — Progress Notes (Signed)
[facility] Progress Note Patient: Chelsea [name] MBW:[id number] DOB: May 16, 1966 DOA: 09/17/2022  DOS: the patient was seen and examined on 09/28/2022  Brief hospital course: Chelsea [name] is a 56 y.o. F with asthma/COPD, dCHF and OHS and hx tracheostomy now changed, MO with BMI 54, and chronic respiratory failure on 3L home O2, anemia, AF on eliquis, DM, HTN lives in ALF, who presented with .. dyspnea. 12/18: Started on steroids, diuresis 12/19: Cardiology consulted 12/23.  Hypoxia worsening.  Started on continuous BiPAP.  Patient has been refusing BiPAP therapy in the hospital. 12/26.  Initiated on Tussionex nightly for cough. Assessment and Plan: Acute on chronic hypoxic and hypercarbic respiratory failure COPD with acute exacerbation. Obesity hypoventilation syndrome. Patient is chronically on 4 L of oxygen (not 3). Patient is on BiPAP at home but refusing that therapy here in the hospital. Presents with complaints of shortness of breath and treated with IV steroids for COPD exacerbation with azithromycin and IV diuresis. Oxygenation improved compared to 10 L on 12/23 to 6 L on 12/24.  She is requiring up to 8 L today Continue BiPAP nightly and as needed.  Emphasized the importance of compliance. Continue DuoNebs.  Taper prednisone off.  Added Tussionex nightly. Will monitor for now.  Acute on chronic diastolic CHF. Echocardiogram in October shows preserved EF and grade 2 diastolic and moderate aortic stenosis. Currently on IV Lasix. She is -21 L since admission Started on acetazolamide for contraction alkalosis as her bicarb> 45  Left forearm and right leg cellulitis. Denies improving. Completed 5 days of doxycycline therapy.  Moderate aortic stenosis. Monitor for now.  Management per cardiology.  Paroxysmal A-fib. Currently rate controlled. Continue Eliquis.  Not on any rate control medication.  Acute on chronic iron deficiency anemia. H&H relatively stable.   Monitor.  Transfuse for hemoglobin less than 7.  Diabetes mellitus, uncontrolled with hyperglycemia without long-term insulin use Hyperglycemia in the hospital most likely secondary to steroids. Recent hemoglobin A1c 5.2. Trending down from prior values which is excellent without any medication. For now we will monitor.  Morbid obesity. Body mass index is 51.21 kg/m.  Placing the patient at high risk of poor outcome.  Goals of care conversation. Provided education to the patient with regards to importance of pulmonary toilet, incentive spirometry as well as BiPAP use multiple times. Emphasized that she needs to be working with physical therapy and she needs to be spending more time upright in the chair or in the bed. Emphasized the importance of lateral position with regards to recruiting alveoli as well. Explained that with her noncompliance with BiPAP, incentive spirometry as well as spending most of the time in the bed she remains at very high risk for ICU transfer.  And if she gets intubated with her deconditioning her risk for tracheostomy is very high.   Subjective: Denies any acute complaint.  Continues to have shortness of breath.  No nausea no vomiting no fever no chills.  Continues to have cough.  Physical Exam:   Awake Alert, Oriented X 3, No new F.N deficits, Normal affect Symmetrical Chest wall movement, decreased air entry at the bases, distant lung sounds due to body habitus RRR,No Gallops,Rubs + sys  Murmurs, No Parasternal Heave +ve B.Sounds, Abd Soft, No tenderness, No rebound - guarding or rigidity. No Cyanosis, Clubbing, edema present with chronic lower extremity skin changes    Data Reviewed: I have Reviewed nursing notes, Vitals, and Lab results. Since last encounter, pertinent lab results CBC and BMP   .  I have ordered test including BMP and magnesium  . I have discussed pt's care plan and test results with cardiology  .        Disposition: Status is:  Inpatient Remains inpatient appropriate because: Need for IV diuresis and BiPAP therapy  SCDs Start: 09/17/22 2119 apixaban (ELIQUIS) tablet 5 mg   Family Communication: No one at bedside Level of care: Telemetry Cardiac Continue telemetry for CHF. Vitals:   09/28/22 0102 09/28/22 0625 09/28/22 0740 09/28/22 0841  BP: (!) 91/57  112/65   Pulse: 71  68 67  Resp:    16  Temp:      TempSrc:      SpO2:   91% 97%  Weight:  (!) 139.6 kg    Height:         Author: Phillips Climes, MD 09/28/2022 3:46 PM  Please look on [email or web address] to find out who is on call.

## 2022-09-29 DIAGNOSIS — I50813 Acute on chronic right heart failure: Secondary | ICD-10-CM

## 2022-09-29 DIAGNOSIS — I5033 Acute on chronic diastolic (congestive) heart failure: Secondary | ICD-10-CM | POA: Diagnosis not present

## 2022-09-29 DIAGNOSIS — J441 Chronic obstructive pulmonary disease with (acute) exacerbation: Secondary | ICD-10-CM | POA: Diagnosis not present

## 2022-09-29 DIAGNOSIS — I2781 Cor pulmonale (chronic): Secondary | ICD-10-CM | POA: Diagnosis not present

## 2022-09-29 DIAGNOSIS — J9621 Acute and chronic respiratory failure with hypoxia: Secondary | ICD-10-CM | POA: Diagnosis not present

## 2022-09-29 LAB — GLUCOSE, CAPILLARY
Glucose-Capillary: 198 mg/dL — ABNORMAL HIGH (ref 70–99)
Glucose-Capillary: 251 mg/dL — ABNORMAL HIGH (ref 70–99)
Glucose-Capillary: 273 mg/dL — ABNORMAL HIGH (ref 70–99)
Glucose-Capillary: 197 mg/dL — ABNORMAL HIGH (ref 70–99)

## 2022-09-29 LAB — BASIC METABOLIC PANEL
Anion gap: 14 (ref 5–15)
BUN: 32 mg/dL — ABNORMAL HIGH (ref 6–20)
CO2: 38 mmol/L — ABNORMAL HIGH (ref 22–32)
Calcium: 8.7 mg/dL — ABNORMAL LOW (ref 8.9–10.3)
Chloride: 83 mmol/L — ABNORMAL LOW (ref 98–111)
Creatinine, Ser: 0.93 mg/dL (ref 0.44–1.00)
GFR, Estimated: >60 mL/min (ref >60)
Glucose, Bld: 204 mg/dL — ABNORMAL HIGH (ref 70–99)
Potassium: 4 mmol/L (ref 3.5–5.1)
Sodium: 135 mmol/L (ref 135–145)

## 2022-09-29 NOTE — Progress Notes (Signed)
Rounding Note    Patient Name: Chelsea [name] Date of Encounter: 09/29/2022  [facility] Cardiologist: Candee Furbish, MD   Subjective   Reports she is feeling better.  Lying fully flat in bed without respiratory difficulty. Net diuresis 27 L since admission.  Weight is down 9.5 kg.  Inpatient Medications    Scheduled Meds:  acetaZOLAMIDE  250 mg Oral TID WC   apixaban  5 mg Oral BID   budesonide  0.5 mg Inhalation BID   cyanocobalamin  1,000 mcg Oral Daily   dextromethorphan  30 mg Oral BID   empagliflozin  10 mg Oral Daily   ferrous sulfate  325 mg Oral Q breakfast   folic acid  1 mg Oral Daily   furosemide  60 mg Intravenous TID   gabapentin  400 mg Oral TID   insulin aspart  0-15 Units Subcutaneous TID WC   insulin aspart  0-5 Units Subcutaneous QHS   levothyroxine  150 mcg Oral Q0600   loratadine  10 mg Oral Daily   pantoprazole  40 mg Oral Daily   spironolactone  12.5 mg Oral Daily   Continuous Infusions:  sodium chloride 10 mL/hr at 09/19/22 1758   PRN Meds: sodium chloride, acetaminophen **OR** acetaminophen, albuterol, diclofenac Sodium, guaiFENesin-dextromethorphan, HYDROcodone-acetaminophen, loperamide, LORazepam, melatonin, Muscle Rub, senna-docusate   Vital Signs    Vitals:   09/29/22 0511 09/29/22 0613 09/29/22 0809 09/29/22 0845  BP:  101/71 99/69   Pulse:   72 63  Resp:   20 16  Temp:  98.1 F (36.7 C)    TempSrc:  Oral    SpO2:   97% 97%  Weight: (!) 139.6 kg     Height:        Intake/Output Summary (Last 24 hours) at 09/29/2022 1031 Last data filed at 09/29/2022 1003 Gross per 24 hour  Intake 960 ml  Output 3650 ml  Net -2690 ml      09/29/2022    5:11 AM 09/28/2022    6:25 AM 09/27/2022    4:41 AM  Last 3 Weights  Weight (lbs) 307 lb 12.2 oz 307 lb 12.2 oz 307 lb 12.2 oz  Weight (kg) 139.6 kg 139.6 kg 139.6 kg      Telemetry    Sinus rhythm with occasional PVCs and a 5 beat run of AIVR- Personally  Reviewed  ECG    Sinus rhythm with nonspecific repolarization abnormalities - Personally Reviewed  Physical Exam  Super-obese.  The exam is limited. GEN: No acute distress.   Neck: Unable to see JVD Cardiac: RRR, normal S1 and distinct S2, 3/6 aortic ejection murmur is mid peaking, no diastolic murmurs, rubs, or gallops.  Respiratory: Clear to auscultation bilaterally. GI: Soft, nontender, non-distended  MS: Chronic brawny edema, scaly skin changes, but showing some wrinkles suggesting improved edema Neuro:  Nonfocal  Psych: Normal affect   Labs    High Sensitivity Troponin:   Recent Labs  Lab 09/17/22 1727 09/17/22 2119  TROPONINIHS 5 6     Chemistry Recent Labs  Lab 09/25/22 0107 09/26/22 0239 09/28/22 0159 09/29/22 0021  NA 139 140 139 135  K 4.0 3.9 4.0 4.0  CL 86* 85* 82* 83*  CO2 43* 44* >45* 38*  GLUCOSE 220* 83 159* 204*  BUN 31* 27* 29* 32*  CREATININE 0.68 0.71 0.76 0.93  CALCIUM 8.5* 8.8* 9.0 8.7*  MG 2.3 2.3 2.3  --   GFRNONAA >60 >60 >60 >60  ANIONGAP 10 11  NOT CALCULATED 14    Lipids No results for input(s): "CHOL", "TRIG", "HDL", "LABVLDL", "LDLCALC", "CHOLHDL" in the last 168 hours.  Hematology Recent Labs  Lab 09/24/22 0049 09/25/22 0107 09/28/22 0159  WBC 6.8 8.7 7.8  RBC 3.46* 3.55* 4.22  HGB 9.1* 9.2* 10.6*  HCT 31.8* 32.9* 37.8  MCV 91.9 92.7 89.6  MCH 26.3 25.9* 25.1*  MCHC 28.6* 28.0* 28.0*  RDW 15.1 15.0 14.9  PLT 247 321 312   Thyroid No results for input(s): "TSH", "FREET4" in the last 168 hours.  BNPNo results for input(s): "BNP", "PROBNP" in the last 168 hours.  DDimer No results for input(s): "DDIMER" in the last 168 hours.   Radiology    No results found.  Cardiac Studies   Echocardiogram 07/30/2022   1. Left ventricular ejection fraction, by estimation, is 60 to 65%. The  left ventricle has normal function. The left ventricle has no regional  wall motion abnormalities. There is mild left ventricular  hypertrophy.  Left ventricular diastolic parameters  are consistent with Grade II diastolic dysfunction (pseudonormalization).  Elevated left ventricular end-diastolic pressure.   2. Right ventricular systolic function is normal. The right ventricular  size is mildly enlarged. Tricuspid regurgitation signal is inadequate for  assessing PA pressure.   3. Left atrial size was severely dilated.   4. The mitral valve is degenerative. Trivial mitral valve regurgitation.  No evidence of mitral stenosis.   5. The aortic valve is abnormal. Unable to determine aortic valve  morphology due to image quality. There is moderate calcification of the  aortic valve. Aortic valve regurgitation is trivial. Moderate aortic valve  stenosis. Aortic valve area, by VTI  measures 1.16 cm. Aortic valve mean gradient measures 31.0 mmHg. Aortic  valve Vmax measures 3.58 m/s.   6. The inferior vena cava is dilated in size with >50% respiratory  variability, suggesting right atrial pressure of 8 mmHg.   7. Increased flow velocities may be secondary to anemia, thyrotoxicosis,  hyperdynamic or high flow state.    AORTIC VALVE   AV Area (VTI):     1.16 cm  AV Vmax:           358.00 cm/s  AV Peak Grad:      51.3 mmHg  AV Mean Grad:      31.0 mmHg  LVOT Vmax:         141.50 cm/s  LVOT/AV VTI ratio: 0.41   Patient Profile     56 y.o. female with super morbid obesity, obesity hypoventilation syndrome, COPD, moderate aortic stenosis, chronic diastolic heart failure, admitted with COPD exacerbation and right heart failure exacerbation.  Assessment & Plan    Principal problem is acute on chronic right heart failure/ cor pulmonale due to morbid obesity and obesity hypoventilation syndrome.  Left heart failure due to diastolic dysfunction is a minor component of her illness. Clinical exam and echo parameters are indeed compatible with moderate aortic stenosis with gradients relatively high due to increased cardiac  output from anemia and obesity. Considering her level of chronic hypoxemia she actually has moderate anemia, not just mild anemia. BNP of 143 is slightly elevated (above what can be considered her baseline at 62), but much lower than previous recorded values earlier this year. Would use acetazolamide cautiously, since compensatory metabolic alkalosis is expected with her pathophysiology.  Dose acetazolamide only when CO2 levels are over 40 or ABG confirms excessive metabolic alkalosis.     For questions or updates, please contact [facility] Please consult [email or web address] for contact info under        Signed, Sanda Klein, MD  09/29/2022, 10:31 AM

## 2022-09-29 NOTE — Progress Notes (Signed)
[facility] Progress Note Patient: Chelsea [name]:[id number] DOB: 25-Dec-1965 DOA: 09/17/2022  DOS: the patient was seen and examined on 09/29/2022  Brief hospital course: Chelsea [name] is a 56 y.o. F with asthma/COPD, dCHF and OHS and hx tracheostomy now changed, MO with BMI 54, and chronic respiratory failure on 3L home O2, anemia, AF on eliquis, DM, HTN lives in ALF, who presented with .. dyspnea. 12/18: Started on steroids, diuresis 12/19: Cardiology consulted 12/23.  Hypoxia worsening.  Started on continuous BiPAP.  Patient has been refusing BiPAP therapy in the hospital. 12/26.  Initiated on Tussionex nightly for cough.  Assessment and Plan:  Acute on chronic hypoxic and hypercarbic respiratory failure COPD with acute exacerbation. Obesity hypoventilation syndrome. Patient is chronically on 4 L of oxygen (not 3). Patient is on BiPAP at home but refusing that therapy here in the hospital. Presents with complaints of shortness of breath and treated with IV steroids for COPD exacerbation with azithromycin and IV diuresis. Oxygenation improved compared to 10 L on 12/23 to 6 L on 12/24.  She remains on 8 L today. Continue BiPAP nightly and as needed.  Emphasized the importance of compliance. Continue DuoNebs.  Taper prednisone off.  Added Tussionex nightly. Will monitor for now.  Acute on chronic diastolic CHF. Right heart failure Echocardiogram in October shows preserved EF and grade 2 diastolic and moderate aortic stenosis. -Diuresis per cardiology, volume status improving, she remains on Lasix 60 mg IV 3 times daily, she is -26 L since admission -will DC acetazolamide now her bicarb is 38.  Left forearm and right leg cellulitis. Denies improving. Completed 5 days of doxycycline therapy.  Moderate aortic stenosis. Monitor for now.  Management per cardiology.  Paroxysmal A-fib. Currently rate controlled. Continue Eliquis.  Not on any rate control  medication.  Acute on chronic iron deficiency anemia. H&H relatively stable.  Monitor.  Transfuse for hemoglobin less than 7.  Diabetes mellitus, uncontrolled with hyperglycemia without long-term insulin use Hyperglycemia in the hospital most likely secondary to steroids. Recent hemoglobin A1c 5.2. Trending down from prior values which is excellent without any medication. For now we will monitor.  Morbid obesity. Body mass index is 51.21 kg/m.  Placing the patient at high risk of poor outcome.  Goals of care conversation. Provided education to the patient with regards to importance of pulmonary toilet, incentive spirometry as well as BiPAP use multiple times. Emphasized that she needs to be working with physical therapy and she needs to be spending more time upright in the chair or in the bed. Emphasized the importance of lateral position with regards to recruiting alveoli as well. Explained that with her noncompliance with BiPAP, incentive spirometry as well as spending most of the time in the bed she remains at very high risk for ICU transfer.  And if she gets intubated with her deconditioning her risk for tracheostomy is very high.   Subjective:   No significant events overnight, she denies any complaints today.     Physical Exam:  Awake Alert, Oriented X 3, No new F.N deficits, Normal affect Symmetrical Chest wall movement, Good air movement bilaterally, CTAB RRR,No Gallops,Rubs , positive  systolic  Murmurs, No Parasternal Heave +ve B.Sounds, Abd Soft, No tenderness, No rebound - guarding or rigidity. No Cyanosis, Clubbing , lower ext edema improving    Data Reviewed: I have Reviewed nursing notes, Vitals, and Lab results. Since last encounter, pertinent lab results CBC and BMP   . I have ordered test including  BMP and magnesium  . I have discussed pt's care plan and test results with cardiology  .        Disposition: Status is: Inpatient Remains inpatient appropriate  because: Need for IV diuresis and BiPAP therapy  SCDs Start: 09/17/22 2119 apixaban (ELIQUIS) tablet 5 mg   Family Communication: No one at bedside Level of care: Telemetry Cardiac Continue telemetry for CHF. Vitals:   09/29/22 0613 09/29/22 0809 09/29/22 0845 09/29/22 1115  BP: 101/71 99/69  (!) 89/55  Pulse:  72 63 71  Resp:  '20 16 18  '$ Temp: 98.1 F (36.7 C)   97.7 F (36.5 C)  TempSrc: Oral   Oral  SpO2:  97% 97% 93%  Weight:      Height:         Author: Phillips Climes, MD 09/29/2022 2:20 PM  Please look on [email or web address] to find out who is on call.

## 2022-09-30 DIAGNOSIS — J9621 Acute and chronic respiratory failure with hypoxia: Secondary | ICD-10-CM | POA: Diagnosis not present

## 2022-09-30 DIAGNOSIS — I5033 Acute on chronic diastolic (congestive) heart failure: Secondary | ICD-10-CM | POA: Diagnosis not present

## 2022-09-30 LAB — GLUCOSE, CAPILLARY
Glucose-Capillary: 153 mg/dL — ABNORMAL HIGH (ref 70–99)
Glucose-Capillary: 209 mg/dL — ABNORMAL HIGH (ref 70–99)
Glucose-Capillary: 182 mg/dL — ABNORMAL HIGH (ref 70–99)
Glucose-Capillary: 211 mg/dL — ABNORMAL HIGH (ref 70–99)

## 2022-09-30 LAB — BASIC METABOLIC PANEL
Anion gap: 12 (ref 5–15)
BUN: 30 mg/dL — ABNORMAL HIGH (ref 6–20)
CO2: 39 mmol/L — ABNORMAL HIGH (ref 22–32)
Calcium: 9 mg/dL (ref 8.9–10.3)
Chloride: 87 mmol/L — ABNORMAL LOW (ref 98–111)
Creatinine, Ser: 0.84 mg/dL (ref 0.44–1.00)
GFR, Estimated: >60 mL/min (ref >60)
Glucose, Bld: 197 mg/dL — ABNORMAL HIGH (ref 70–99)
Potassium: 2.9 mmol/L — ABNORMAL LOW (ref 3.5–5.1)
Sodium: 138 mmol/L (ref 135–145)

## 2022-09-30 MED ORDER — POTASSIUM CHLORIDE CRYS ER 20 MEQ PO TBCR
40.0000 meq | EXTENDED_RELEASE_TABLET | Freq: Four times a day (QID) | ORAL | Status: AC
  Administered 2022-09-30 (×2): 40 meq via ORAL
  Filled 2022-09-30 (×2): qty 2

## 2022-09-30 MED ORDER — POTASSIUM CHLORIDE CRYS ER 20 MEQ PO TBCR
20.0000 meq | EXTENDED_RELEASE_TABLET | Freq: Once | ORAL | Status: AC
  Administered 2022-09-30: 20 meq via ORAL
  Filled 2022-09-30: qty 1

## 2022-09-30 MED ORDER — FUROSEMIDE 40 MG PO TABS
80.0000 mg | ORAL_TABLET | Freq: Two times a day (BID) | ORAL | Status: DC
  Administered 2022-10-01: 80 mg via ORAL
  Filled 2022-09-30: qty 2

## 2022-09-30 NOTE — Progress Notes (Signed)
[facility] Progress Note Patient: Chelsea [name] DGU:[id number] DOB: 19-Apr-1966 DOA: 09/17/2022  DOS: the patient was seen and examined on 09/30/2022  Brief hospital course: Chelsea [name] is a 56 y.o. F with asthma/COPD, dCHF and OHS and hx tracheostomy now changed, MO with BMI 54, and chronic respiratory failure on 3L home O2, anemia, AF on eliquis, DM, HTN lives in ALF, who presented with .. dyspnea. 12/18: Started on steroids, diuresis 12/19: Cardiology consulted 12/23.  Hypoxia worsening.  Started on continuous BiPAP.  Patient has been refusing BiPAP therapy in the hospital. 12/26.  Initiated on Tussionex nightly for cough.  Assessment and Plan:  Acute on chronic hypoxic and hypercarbic respiratory failure COPD with acute exacerbation. Obesity hypoventilation syndrome. Patient is chronically on 4 L of oxygen (not 3). Patient is on BiPAP at home but refusing that therapy here in the hospital. Presents with complaints of shortness of breath and treated with IV steroids for COPD exacerbation with azithromycin and IV diuresis. Oxygenation improved compared to 10 L on 12/23 to 6 L on 12/24.  She remains on 8 L today. Continue BiPAP nightly and as needed.  Emphasized the importance of compliance. Continue DuoNebs.  Taper prednisone off.  Added Tussionex nightly. Will monitor for now.  Acute on chronic diastolic CHF. Right heart failure Echocardiogram in October shows preserved EF and grade 2 diastolic and moderate aortic stenosis. -Diuresis per cardiology, volume status improving,  she is -27.5 L since admission -IV diuresis on hold today given hypokalemia, likely will start p.o. diuretics tomorrow per cardiology  Hypokalemia -Repleted, repeat BMP in a.m., she remains on low-dose Aldactone.  Left forearm and right leg cellulitis. Denies improving. Completed 5 days of doxycycline therapy.  Moderate aortic stenosis. Monitor for now.  Management per  cardiology.  Paroxysmal A-fib. Currently rate controlled. Continue Eliquis.  Not on any rate control medication.  Acute on chronic iron deficiency anemia. H&H relatively stable.  Monitor.  Transfuse for hemoglobin less than 7.  Diabetes mellitus, uncontrolled with hyperglycemia without long-term insulin use Hyperglycemia in the hospital most likely secondary to steroids. Recent hemoglobin A1c 5.2. Trending down from prior values which is excellent without any medication. For now we will monitor.  Morbid obesity. Body mass index is 50.92 kg/m.  Placing the patient at high risk of poor outcome.  Goals of care conversation. Provided education to the patient with regards to importance of pulmonary toilet, incentive spirometry as well as BiPAP use multiple times. Emphasized that she needs to be working with physical therapy and she needs to be spending more time upright in the chair or in the bed. Emphasized the importance of lateral position with regards to recruiting alveoli as well. Explained that with her noncompliance with BiPAP, incentive spirometry as well as spending most of the time in the bed she remains at very high risk for ICU transfer.  And if she gets intubated with her deconditioning her risk for tracheostomy is very high.   Subjective:   No significant events overnight, she denies any complaints today.     Physical Exam:  Awake Alert, Oriented X 3, No new F.N deficits, Normal affect Symmetrical Chest wall movement, Good air movement bilaterally, CTAB RRR,No Gallops,Rubs , + sys Murmurs, No Parasternal Heave +ve B.Sounds, Abd Soft, No tenderness, No rebound - guarding or rigidity. No Cyanosis, Clubbing, lower extremity edema almost resolved,    Data Reviewed: I have Reviewed nursing notes, Vitals, and Lab results. Since last encounter, pertinent lab results CBC and BMP   .  I have ordered test including BMP and magnesium  . I have discussed pt's care plan and  test results with cardiology  .        Disposition: Status is: Inpatient Remains inpatient appropriate because: Need for IV diuresis and BiPAP therapy  SCDs Start: 09/17/22 2119 apixaban (ELIQUIS) tablet 5 mg   Family Communication: No one at bedside Level of care: Telemetry Cardiac Continue telemetry for CHF. Vitals:   09/30/22 0602 09/30/22 0612 09/30/22 0710 09/30/22 0919  BP: (!) 88/60 98/63  95/65  Pulse:    68  Resp: 16   20  Temp: 98.2 F (36.8 C)     TempSrc: Oral     SpO2:  96% 97% 94%  Weight: (!) 138.8 kg     Height:         Author: Phillips Climes, MD 09/30/2022 1:19 PM  Please look on [email or web address] to find out who is on call.

## 2022-09-30 NOTE — Progress Notes (Signed)
Rounding Note    Patient Name: Chelsea [name] Date of Encounter: 09/30/2022  [facility] Cardiologist: Candee Furbish, MD   Subjective   Continues with excellent diuresis and weight down another 2 lb, but has severe hypokalemia. BUN and creat are unchanged.  Inpatient Medications    Scheduled Meds:  apixaban  5 mg Oral BID   budesonide  0.5 mg Inhalation BID   cyanocobalamin  1,000 mcg Oral Daily   dextromethorphan  30 mg Oral BID   empagliflozin  10 mg Oral Daily   ferrous sulfate  325 mg Oral Q breakfast   folic acid  1 mg Oral Daily   furosemide  60 mg Intravenous TID   gabapentin  400 mg Oral TID   insulin aspart  0-15 Units Subcutaneous TID WC   insulin aspart  0-5 Units Subcutaneous QHS   levothyroxine  150 mcg Oral Q0600   loratadine  10 mg Oral Daily   pantoprazole  40 mg Oral Daily   potassium chloride  40 mEq Oral Q6H   spironolactone  12.5 mg Oral Daily   Continuous Infusions:  sodium chloride 10 mL/hr at 09/19/22 1758   PRN Meds: sodium chloride, acetaminophen **OR** acetaminophen, albuterol, diclofenac Sodium, guaiFENesin-dextromethorphan, HYDROcodone-acetaminophen, loperamide, LORazepam, melatonin, Muscle Rub, senna-docusate   Vital Signs    Vitals:   09/30/22 0602 09/30/22 0612 09/30/22 0710 09/30/22 0919  BP: (!) 88/60 98/63  95/65  Pulse:    68  Resp: 16   20  Temp: 98.2 F (36.8 C)     TempSrc: Oral     SpO2:  96% 97% 94%  Weight: (!) 138.8 kg     Height:        Intake/Output Summary (Last 24 hours) at 09/30/2022 1226 Last data filed at 09/30/2022 1610 Gross per 24 hour  Intake 1437 ml  Output 2120 ml  Net -683 ml      09/30/2022    6:02 AM 09/29/2022    5:11 AM 09/28/2022    6:25 AM  Last 3 Weights  Weight (lbs) 306 lb 307 lb 12.2 oz 307 lb 12.2 oz  Weight (kg) 138.8 kg 139.6 kg 139.6 kg      Telemetry    SR w PACs/PVCs - Personally Reviewed  ECG    No new tracing - Personally Reviewed  Physical Exam   Super obese. Able to lie horizontally. Exam limited. GEN: No acute distress.   Neck: No JVD Cardiac: RRR, S1 + distiunct S2, 3/6 AS murmur, no diastolic murmurs, rubs, or gallops.  Respiratory: Clear to auscultation bilaterally. GI: Soft, nontender, non-distended  MS: chronic brawny edema; No deformity. Neuro:  Nonfocal  Psych: Normal affect   Labs    High Sensitivity Troponin:   Recent Labs  Lab 09/17/22 1727 09/17/22 2119  TROPONINIHS 5 6     Chemistry Recent Labs  Lab 09/25/22 0107 09/26/22 0239 09/28/22 0159 09/29/22 0021 09/30/22 0021  NA 139 140 139 135 138  K 4.0 3.9 4.0 4.0 2.9*  CL 86* 85* 82* 83* 87*  CO2 43* 44* >45* 38* 39*  GLUCOSE 220* 83 159* 204* 197*  BUN 31* 27* 29* 32* 30*  CREATININE 0.68 0.71 0.76 0.93 0.84  CALCIUM 8.5* 8.8* 9.0 8.7* 9.0  MG 2.3 2.3 2.3  --   --   GFRNONAA >60 >60 >60 >60 >60  ANIONGAP 10 11 NOT CALCULATED 14 12    Lipids No results for input(s): "CHOL", "TRIG", "HDL", "LABVLDL", "LDLCALC", "CHOLHDL"  in the last 168 hours.  Hematology Recent Labs  Lab 09/24/22 0049 09/25/22 0107 09/28/22 0159  WBC 6.8 8.7 7.8  RBC 3.46* 3.55* 4.22  HGB 9.1* 9.2* 10.6*  HCT 31.8* 32.9* 37.8  MCV 91.9 92.7 89.6  MCH 26.3 25.9* 25.1*  MCHC 28.6* 28.0* 28.0*  RDW 15.1 15.0 14.9  PLT 247 321 312   Thyroid No results for input(s): "TSH", "FREET4" in the last 168 hours.  BNPNo results for input(s): "BNP", "PROBNP" in the last 168 hours.  DDimer No results for input(s): "DDIMER" in the last 168 hours.   Radiology    No results found.  Cardiac Studies    Echocardiogram 07/30/2022   1. Left ventricular ejection fraction, by estimation, is 60 to 65%. The  left ventricle has normal function. The left ventricle has no regional  wall motion abnormalities. There is mild left ventricular hypertrophy.  Left ventricular diastolic parameters  are consistent with Grade II diastolic dysfunction (pseudonormalization).  Elevated left  ventricular end-diastolic pressure.   2. Right ventricular systolic function is normal. The right ventricular  size is mildly enlarged. Tricuspid regurgitation signal is inadequate for  assessing PA pressure.   3. Left atrial size was severely dilated.   4. The mitral valve is degenerative. Trivial mitral valve regurgitation.  No evidence of mitral stenosis.   5. The aortic valve is abnormal. Unable to determine aortic valve  morphology due to image quality. There is moderate calcification of the  aortic valve. Aortic valve regurgitation is trivial. Moderate aortic valve  stenosis. Aortic valve area, by VTI  measures 1.16 cm. Aortic valve mean gradient measures 31.0 mmHg. Aortic  valve Vmax measures 3.58 m/s.   6. The inferior vena cava is dilated in size with >50% respiratory  variability, suggesting right atrial pressure of 8 mmHg.   7. Increased flow velocities may be secondary to anemia, thyrotoxicosis,  hyperdynamic or high flow state.     AORTIC VALVE   AV Area (VTI):     1.16 cm  AV Vmax:           358.00 cm/s  AV Peak Grad:      51.3 mmHg  AV Mean Grad:      31.0 mmHg  LVOT Vmax:         141.50 cm/s  LVOT/AV VTI ratio: 0.41   Patient Profile     56 y.o. female with super morbid obesity, obesity hypoventilation syndrome, COPD, moderate aortic stenosis, chronic diastolic heart failure, admitted with COPD exacerbation and right heart failure exacerbation.   Assessment & Plan    CHF: almost entirely R heart failure primarily due to OHS; hard to objectively assess volume status by exam. Hold IV diuretics to allow K replenishment. Change to PO diuretics tomorrow. Hypokalemia: related to simultaneous loop diuretic + acetazolamide use, which is why that combo should be used sparingly. Aggressive K replenishment today. BP does not really allow spironolactone use. AS: moderate at this time (gradients relatively high due to increased cardiac output from anemia and obesity).      For questions or updates, please contact [place] Please consult [email or web address] for contact info under        Signed, Sanda Klein, MD  09/30/2022, 12:26 PM

## 2022-10-01 DIAGNOSIS — I5033 Acute on chronic diastolic (congestive) heart failure: Secondary | ICD-10-CM | POA: Diagnosis not present

## 2022-10-01 DIAGNOSIS — J9621 Acute and chronic respiratory failure with hypoxia: Secondary | ICD-10-CM | POA: Diagnosis not present

## 2022-10-01 LAB — BASIC METABOLIC PANEL
Anion gap: 12 (ref 5–15)
BUN: 30 mg/dL — ABNORMAL HIGH (ref 6–20)
CO2: 35 mmol/L — ABNORMAL HIGH (ref 22–32)
Calcium: 9.2 mg/dL (ref 8.9–10.3)
Chloride: 89 mmol/L — ABNORMAL LOW (ref 98–111)
Creatinine, Ser: 0.94 mg/dL (ref 0.44–1.00)
GFR, Estimated: >60 mL/min (ref >60)
Glucose, Bld: 192 mg/dL — ABNORMAL HIGH (ref 70–99)
Potassium: 3.9 mmol/L (ref 3.5–5.1)
Sodium: 136 mmol/L (ref 135–145)

## 2022-10-01 LAB — GLUCOSE, CAPILLARY
Glucose-Capillary: 142 mg/dL — ABNORMAL HIGH (ref 70–99)
Glucose-Capillary: 213 mg/dL — ABNORMAL HIGH (ref 70–99)
Glucose-Capillary: 184 mg/dL — ABNORMAL HIGH (ref 70–99)
Glucose-Capillary: 176 mg/dL — ABNORMAL HIGH (ref 70–99)

## 2022-10-01 LAB — MAGNESIUM: Magnesium: 2.7 mg/dL — ABNORMAL HIGH (ref 1.7–2.4)

## 2022-10-01 MED ORDER — FUROSEMIDE 10 MG/ML IJ SOLN
80.0000 mg | Freq: Two times a day (BID) | INTRAMUSCULAR | Status: DC
  Administered 2022-10-01 – 2022-10-02 (×2): 80 mg via INTRAVENOUS
  Filled 2022-10-01 (×2): qty 8

## 2022-10-01 NOTE — Progress Notes (Signed)
[facility] Progress Note Patient: VALECIA BESKE TKW:[id number] DOB: April 30, 1966 DOA: 09/17/2022  DOS: the patient was seen and examined on 10/01/2022  Brief hospital course:  Mrs. Attridge is a 57 y.o. F with asthma/COPD, dCHF and OHS and hx tracheostomy now changed, MO with BMI 54, and chronic respiratory failure on 3L home O2, anemia, AF on eliquis, DM, HTN lives in ALF, who presented with .. dyspnea. 12/18: Started on steroids, diuresis 12/19: Cardiology consulted 12/23.  Hypoxia worsening.  Started on continuous BiPAP.  Patient has been refusing BiPAP therapy in the hospital. 12/26.  Initiated on Tussionex nightly for cough.  Assessment and Plan:  Acute on chronic hypoxic and hypercarbic respiratory failure COPD with acute exacerbation. Obesity hypoventilation syndrome. Patient is chronically on 4 L of oxygen (not 3). Patient is on BiPAP at home but refusing that therapy here in the hospital. Presents with complaints of shortness of breath and treated with IV steroids for COPD exacerbation with azithromycin and IV diuresis. Oxygenation improved compared to 10 L on 12/23 to 6 L on 12/24.  She remains on 8 L today. Continue BiPAP nightly and as needed.  Emphasized the importance of compliance. Continue DuoNebs.  Taper prednisone off.  Added Tussionex nightly. Will monitor for now.  Acute on chronic diastolic CHF. Right heart failure Echocardiogram in October shows preserved EF and grade 2 diastolic and moderate aortic stenosis. -Diuresis per cardiology, volume status improving,  she is -27.5 L since admission, now her hypokalemia has corrected, she is on back on IV Lasix 80 mg IV twice daily.  Hypokalemia -Repleted,  Left forearm and right leg cellulitis. Completed 5 days of doxycycline therapy.  Moderate aortic stenosis. Monitor for now.  Management per cardiology.  She will eventually need TAVR if she can lose weight  Paroxysmal A-fib. Currently rate  controlled. Continue Eliquis.  Not on any rate control medication.  Acute on chronic iron deficiency anemia. H&H relatively stable.  Monitor.  Transfuse for hemoglobin less than 7.  Diabetes mellitus, uncontrolled with hyperglycemia without long-term insulin use Hyperglycemia in the hospital most likely secondary to steroids. Recent hemoglobin A1c 5.2. Trending down from prior values which is excellent without any medication. For now we will monitor.  Morbid obesity. Body mass index is 50.96 kg/m.  Placing the patient at high risk of poor outcome.  Goals of care conversation. Provided education to the patient with regards to importance of pulmonary toilet, incentive spirometry as well as BiPAP use multiple times. Emphasized that she needs to be working with physical therapy and she needs to be spending more time upright in the chair or in the bed. Emphasized the importance of lateral position with regards to recruiting alveoli as well. Explained that with her noncompliance with BiPAP, incentive spirometry as well as spending most of the time in the bed she remains at very high risk for ICU transfer.  And if she gets intubated with her deconditioning her risk for tracheostomy is very high.   Subjective:   No significant events overnight, she denies any complaints today   Physical Exam:   Awake Alert, Oriented X 3, No new F.N deficits, Normal affect Symmetrical Chest wall movement, diminished air entry bilaterally with scattered Rales at the bases RRR,No Gallops,Rubs or new Murmurs, No Parasternal Heave +ve B.Sounds, Abd Soft, No tenderness, No rebound - guarding or rigidity. No Cyanosis, edema has resolved, chronic lower extremity skin changes     Data Reviewed: I have Reviewed nursing notes, Vitals, and Lab results.  Since last encounter, pertinent lab results CBC and BMP   . I have ordered test including BMP and magnesium  . I have discussed pt's care plan and test results  with cardiology  .        Disposition: Status is: Inpatient Remains inpatient appropriate because: Need for IV diuresis and BiPAP therapy  SCDs Start: 09/17/22 2119 apixaban (ELIQUIS) tablet 5 mg   Family Communication: No one at bedside Level of care: Telemetry Cardiac Continue telemetry for CHF. Vitals:   10/01/22 0649 10/01/22 0807 10/01/22 0845 10/01/22 1222  BP: 120/77 (!) 104/56  (!) 112/58  Pulse: 71 79 67 69  Resp: '18 18 17 18  '$ Temp: 98.5 F (36.9 C) 98.2 F (36.8 C)  98.1 F (36.7 C)  TempSrc: Oral Oral  Oral  SpO2: 98% 95% 100% 96%  Weight:      Height:         Author: Phillips Climes, MD 10/01/2022 3:21 PM  Please look on [email or web address] to find out who is on call.

## 2022-10-01 NOTE — TOC Progression Note (Signed)
Transition of Care [facility]) - Progression Note    Patient Details  Name: Chelsea [name] MRN: [id number] Date of Birth: 1965-12-01  Transition of Care [facility]) CM/SW [place], LCSW Phone Number: 10/01/2022, 2:39 PM  Clinical Narrative:   CSW spoke with [facility] SNF and was informed they will not be able to take pt back upon dc. CSW will communicate this with pt and discuss other SNF options.       Barriers to Discharge: Continued Medical Work up  Expected Discharge Plan and Services       Living arrangements for the past 2 months: [place] Determinants of Health (SDOH) Interventions [place]: No Food Insecurity (07/19/2022)  Housing: Low Risk  (07/19/2022)  Transportation Needs: No Transportation Needs (07/19/2022)  Utilities: Not At Risk (07/19/2022)  Alcohol Screen: Low Risk  (07/05/2022)  Financial Resource Strain: Low Risk  (07/05/2022)  Tobacco Use: Low Risk  (09/17/2022)    Readmission Risk Interventions    07/23/2022    2:19 PM 04/09/2022    4:17 PM  Readmission Risk Prevention Plan  Transportation Screening Complete Complete  PCP or Specialist Appt within 3-5 Days  Complete  HRI or [place]  Complete  Social Work Consult for [place] Planning/Counseling  [place]  Not Applicable  Medication Review Press photographer) Complete Referral to Pharmacy  PCP or Specialist appointment within 3-5 days of discharge Complete   HRI or [place] Complete   SW Recovery Care/Counseling Consult Complete   Palliative Care Screening Not [place] Complete   Beckey Rutter, MSW, [place], [place] Transitions of Care  Clinical Social Worker I

## 2022-10-01 NOTE — Progress Notes (Signed)
Rounding Note    Patient Name: Chelsea [name] Date of Encounter: 10/01/2022  [facility] Cardiologist: Candee Furbish, MD   Subjective   No chest pain or sob.   Inpatient Medications    Scheduled Meds:  apixaban  5 mg Oral BID   budesonide  0.5 mg Inhalation BID   cyanocobalamin  1,000 mcg Oral Daily   dextromethorphan  30 mg Oral BID   empagliflozin  10 mg Oral Daily   ferrous sulfate  325 mg Oral Q breakfast   folic acid  1 mg Oral Daily   furosemide  80 mg Oral BID   gabapentin  400 mg Oral TID   insulin aspart  0-15 Units Subcutaneous TID WC   insulin aspart  0-5 Units Subcutaneous QHS   levothyroxine  150 mcg Oral Q0600   loratadine  10 mg Oral Daily   pantoprazole  40 mg Oral Daily   spironolactone  12.5 mg Oral Daily   Continuous Infusions:  sodium chloride 10 mL/hr at 09/19/22 1758   PRN Meds: sodium chloride, acetaminophen **OR** acetaminophen, albuterol, diclofenac Sodium, guaiFENesin-dextromethorphan, HYDROcodone-acetaminophen, loperamide, LORazepam, melatonin, Muscle Rub, senna-docusate   Vital Signs    Vitals:   10/01/22 0220 10/01/22 0649 10/01/22 0807 10/01/22 0845  BP:  120/77 (!) 104/56   Pulse: 79 71 79 67  Resp: '18 18 18 17  '$ Temp:  98.5 F (36.9 C) 98.2 F (36.8 C)   TempSrc:  Oral Oral   SpO2: 94% 98% 95% 100%  Weight:      Height:        Intake/Output Summary (Last 24 hours) at 10/01/2022 0948 Last data filed at 10/01/2022 0153 Gross per 24 hour  Intake 560 ml  Output 1050 ml  Net -490 ml      10/01/2022    2:16 AM 09/30/2022    6:02 AM 09/29/2022    5:11 AM  Last 3 Weights  Weight (lbs) 306 lb 3.5 oz 306 lb 307 lb 12.2 oz  Weight (kg) 138.9 kg 138.8 kg 139.6 kg      Telemetry    Nsr/st - Personally Reviewed  ECG    none - Personally Reviewed  Physical Exam   GEN: morbidly obese, no acute distress.   Neck: No JVD Cardiac: RRR, no murmurs, rubs, or gallops.  Respiratory: scattered rales in the bases GI:  Soft, nontender, non-distended  MS: No edema; No deformity. Neuro:  Nonfocal  Psych: Normal affect   Labs    High Sensitivity Troponin:   Recent Labs  Lab 09/17/22 1727 09/17/22 2119  TROPONINIHS 5 6     Chemistry Recent Labs  Lab 09/26/22 0239 09/28/22 0159 09/29/22 0021 09/30/22 0021 10/01/22 0040  NA 140 139 135 138 136  K 3.9 4.0 4.0 2.9* 3.9  CL 85* 82* 83* 87* 89*  CO2 44* >45* 38* 39* 35*  GLUCOSE 83 159* 204* 197* 192*  BUN 27* 29* 32* 30* 30*  CREATININE 0.71 0.76 0.93 0.84 0.94  CALCIUM 8.8* 9.0 8.7* 9.0 9.2  MG 2.3 2.3  --   --  2.7*  GFRNONAA >60 >60 >60 >60 >60  ANIONGAP 11 NOT CALCULATED '14 12 12    '$ Lipids No results for input(s): "CHOL", "TRIG", "HDL", "LABVLDL", "LDLCALC", "CHOLHDL" in the last 168 hours.  Hematology Recent Labs  Lab 09/25/22 0107 09/28/22 0159  WBC 8.7 7.8  RBC 3.55* 4.22  HGB 9.2* 10.6*  HCT 32.9* 37.8  MCV 92.7 89.6  MCH 25.9*  25.1*  MCHC 28.0* 28.0*  RDW 15.0 14.9  PLT 321 312   Thyroid No results for input(s): "TSH", "FREET4" in the last 168 hours.  BNPNo results for input(s): "BNP", "PROBNP" in the last 168 hours.  DDimer No results for input(s): "DDIMER" in the last 168 hours.   Radiology    No results found.  Cardiac Studies   none  Patient Profile     57 y.o. female admitted with acute on chronic diastolic heart failure, morbidly obese, hypoventilation  Assessment & Plan    Acute on chronic diastolic CHF - her weight did not go down and her creatinine still the same. I will increase her diuretic and continue IV for another day. Hypokalemia - her potassium is better today. AS - moderate/severe. She will eventually need TAVR if she can lose weight.     For questions or updates, please contact [place] Please consult [email or web address] for contact info under     Signed, Cristopher Peru, MD  10/01/2022, 9:48 AM

## 2022-10-02 DIAGNOSIS — I5033 Acute on chronic diastolic (congestive) heart failure: Secondary | ICD-10-CM | POA: Diagnosis not present

## 2022-10-02 DIAGNOSIS — J9621 Acute and chronic respiratory failure with hypoxia: Secondary | ICD-10-CM | POA: Diagnosis not present

## 2022-10-02 LAB — BASIC METABOLIC PANEL
Anion gap: 13 (ref 5–15)
BUN: 29 mg/dL — ABNORMAL HIGH (ref 6–20)
CO2: 34 mmol/L — ABNORMAL HIGH (ref 22–32)
Calcium: 9.1 mg/dL (ref 8.9–10.3)
Chloride: 91 mmol/L — ABNORMAL LOW (ref 98–111)
Creatinine, Ser: 0.87 mg/dL (ref 0.44–1.00)
GFR, Estimated: >60 mL/min (ref >60)
Glucose, Bld: 174 mg/dL — ABNORMAL HIGH (ref 70–99)
Potassium: 3.6 mmol/L (ref 3.5–5.1)
Sodium: 138 mmol/L (ref 135–145)

## 2022-10-02 LAB — GLUCOSE, CAPILLARY
Glucose-Capillary: 146 mg/dL — ABNORMAL HIGH (ref 70–99)
Glucose-Capillary: 302 mg/dL — ABNORMAL HIGH (ref 70–99)

## 2022-10-02 MED ORDER — ORAL CARE MOUTH RINSE: 15.0000 mL | OROMUCOSAL | Status: DC | PRN

## 2022-10-02 MED ORDER — TORSEMIDE 20 MG PO TABS: 40.0000 mg | ORAL_TABLET | Freq: Two times a day (BID) | ORAL | Status: DC

## 2022-10-02 MED ORDER — EMPAGLIFLOZIN 10 MG PO TABS: 10.0000 mg | ORAL_TABLET | Freq: Every day | ORAL | Status: DC

## 2022-10-02 MED ORDER — FUROSEMIDE 10 MG/ML IJ SOLN: 80.0000 mg | Freq: Three times a day (TID) | INTRAMUSCULAR | Status: DC

## 2022-10-02 MED ORDER — ALPRAZOLAM 0.5 MG PO TABS: 0.5000 mg | ORAL_TABLET | Freq: Three times a day (TID) | ORAL | 0 refills | Status: DC | PRN

## 2022-10-02 MED ORDER — POTASSIUM CHLORIDE CRYS ER 20 MEQ PO TBCR
40.0000 meq | EXTENDED_RELEASE_TABLET | Freq: Once | ORAL | Status: AC
  Administered 2022-10-02: 40 meq via ORAL
  Filled 2022-10-02: qty 2

## 2022-10-02 MED ORDER — HYDROCODONE-ACETAMINOPHEN 5-325 MG PO TABS: 1.0000 | ORAL_TABLET | Freq: Four times a day (QID) | ORAL | 0 refills | Status: DC | PRN

## 2022-10-02 NOTE — Discharge Instructions (Signed)
Follow with Primary MD   Get CBC, CMP,  checked  by Primary MD next visit.    Activity: As tolerated with Full fall precautions use walker/cane & assistance as needed   Disposition SNF   Diet: Heart Healthy  , Follow Cardiac Low Salt Diet and 1.5 lit/day fluid restriction.   On your next visit with your primary care physician please Get Medicines reviewed and adjusted.   Please request your Prim.MD to go over all Hospital Tests and Procedure/Radiological results at the follow up, please get all Hospital records sent to your Prim MD by signing hospital release before you go home.   If you experience worsening of your admission symptoms, develop shortness of breath, life threatening emergency, suicidal or homicidal thoughts you must seek medical attention immediately by calling 911 or calling your MD immediately  if symptoms less severe.  You Must read complete instructions/literature along with all the possible adverse reactions/side effects for all the Medicines you take and that have been prescribed to you. Take any new Medicines after you have completely understood and accpet all the possible adverse reactions/side effects.   Do not drive, operating heavy machinery, perform activities at heights, swimming or participation in water activities or provide baby sitting services if your were admitted for syncope or siezures until you have seen by Primary MD or a Neurologist and advised to do so again.  Do not drive when taking Pain medications.    Do not take more than prescribed Pain, Sleep and Anxiety Medications  Special Instructions: If you have smoked or chewed Tobacco  in the last 2 yrs please stop smoking, stop any regular Alcohol  and or any Recreational drug use.  Wear Seat belts while driving.   Please note  You were cared for by a hospitalist during your hospital stay. If you have any questions about your discharge medications or the care you received while you were in  the hospital after you are discharged, you can call the unit and asked to speak with the hospitalist on call if the hospitalist that took care of you is not available. Once you are discharged, your primary care physician will handle any further medical issues. Please note that NO REFILLS for any discharge medications will be authorized once you are discharged, as it is imperative that you return to your primary care physician (or establish a relationship with a primary care physician if you do not have one) for your aftercare needs so that they can reassess your need for medications and monitor your lab values.

## 2022-10-02 NOTE — Progress Notes (Addendum)
Rounding Note    Patient Name: Chelsea [name] Date of Encounter: 10/02/2022  [facility] Cardiologist: Candee Furbish, MD   Subjective   Patient reports feeling very tired/sleepy today. Says that her breathing is better and that other than unusual sleepiness, she feels back to baseline. Denies chest pain, palpitations, GI upset, or other focal complaints.  Inpatient Medications    Scheduled Meds:  apixaban  5 mg Oral BID   budesonide  0.5 mg Inhalation BID   cyanocobalamin  1,000 mcg Oral Daily   dextromethorphan  30 mg Oral BID   empagliflozin  10 mg Oral Daily   ferrous sulfate  325 mg Oral Q breakfast   folic acid  1 mg Oral Daily   furosemide  80 mg Intravenous BID   gabapentin  400 mg Oral TID   insulin aspart  0-15 Units Subcutaneous TID WC   insulin aspart  0-5 Units Subcutaneous QHS   levothyroxine  150 mcg Oral Q0600   loratadine  10 mg Oral Daily   pantoprazole  40 mg Oral Daily   spironolactone  12.5 mg Oral Daily   Continuous Infusions:  sodium chloride 10 mL/hr at 09/19/22 1758   PRN Meds: sodium chloride, acetaminophen **OR** acetaminophen, albuterol, diclofenac Sodium, guaiFENesin-dextromethorphan, HYDROcodone-acetaminophen, loperamide, LORazepam, melatonin, Muscle Rub, mouth rinse, senna-docusate   Vital Signs    Vitals:   10/01/22 1940 10/01/22 2049 10/02/22 0133 10/02/22 0432  BP: 102/67   107/60  Pulse: 73 76  (!) 57  Resp: '18 20  18  '$ Temp: 98.2 F (36.8 C)   98.4 F (36.9 C)  TempSrc: Oral   Oral  SpO2: 96% 95%  99%  Weight:   (!) 139.8 kg   Height:        Intake/Output Summary (Last 24 hours) at 10/02/2022 0922 Last data filed at 10/02/2022 9767 Gross per 24 hour  Intake 240 ml  Output 2100 ml  Net -1860 ml      10/02/2022    1:33 AM 10/01/2022    2:16 AM 09/30/2022    6:02 AM  Last 3 Weights  Weight (lbs) 308 lb 3.3 oz 306 lb 3.5 oz 306 lb  Weight (kg) 139.8 kg 138.9 kg 138.8 kg      Telemetry    Sinus rhythm -  Personally Reviewed  ECG    No new tracing - Personally Reviewed  Physical Exam   GEN: No acute distress.   Neck: Difficult to assess for elevation of JVP Cardiac: RRR, no rubs, or gallops. Loud systolic murmur LUSB Respiratory: Clear to auscultation bilaterally. GI: Soft, nontender, non-distended  MS: No edema; No deformity. Neuro:  Nonfocal  Psych: Normal affect   Labs    High Sensitivity Troponin:   Recent Labs  Lab 09/17/22 1727 09/17/22 2119  TROPONINIHS 5 6     Chemistry Recent Labs  Lab 09/26/22 0239 09/28/22 0159 09/29/22 0021 09/30/22 0021 10/01/22 0040 10/02/22 0123  NA 140 139   < > 138 136 138  K 3.9 4.0   < > 2.9* 3.9 3.6  CL 85* 82*   < > 87* 89* 91*  CO2 44* >45*   < > 39* 35* 34*  GLUCOSE 83 159*   < > 197* 192* 174*  BUN 27* 29*   < > 30* 30* 29*  CREATININE 0.71 0.76   < > 0.84 0.94 0.87  CALCIUM 8.8* 9.0   < > 9.0 9.2 9.1  MG 2.3 2.3  --   --  2.7*  --   GFRNONAA >60 >60   < > >60 >60 >60  ANIONGAP 11 NOT CALCULATED   < > '12 12 13   '$ < > = values in this interval not displayed.    Lipids No results for input(s): "CHOL", "TRIG", "HDL", "LABVLDL", "LDLCALC", "CHOLHDL" in the last 168 hours.  Hematology Recent Labs  Lab 09/28/22 0159  WBC 7.8  RBC 4.22  HGB 10.6*  HCT 37.8  MCV 89.6  MCH 25.1*  MCHC 28.0*  RDW 14.9  PLT 312   Thyroid No results for input(s): "TSH", "FREET4" in the last 168 hours.  BNPNo results for input(s): "BNP", "PROBNP" in the last 168 hours.  DDimer No results for input(s): "DDIMER" in the last 168 hours.   Radiology    No results found.  Cardiac Studies   07/30/22 TTE   IMPRESSIONS     1. Left ventricular ejection fraction, by estimation, is 60 to 65%. The  left ventricle has normal function. The left ventricle has no regional  wall motion abnormalities. There is mild left ventricular hypertrophy.  Left ventricular diastolic parameters  are consistent with Grade II diastolic dysfunction  (pseudonormalization).  Elevated left ventricular end-diastolic pressure.   2. Right ventricular systolic function is normal. The right ventricular  size is mildly enlarged. Tricuspid regurgitation signal is inadequate for  assessing PA pressure.   3. Left atrial size was severely dilated.   4. The mitral valve is degenerative. Trivial mitral valve regurgitation.  No evidence of mitral stenosis.   5. The aortic valve is abnormal. Unable to determine aortic valve  morphology due to image quality. There is moderate calcification of the  aortic valve. Aortic valve regurgitation is trivial. Moderate aortic valve  stenosis. Aortic valve area, by VTI  measures 1.16 cm. Aortic valve mean gradient measures 31.0 mmHg. Aortic  valve Vmax measures 3.58 m/s.   6. The inferior vena cava is dilated in size with >50% respiratory  variability, suggesting right atrial pressure of 8 mmHg.   7. Increased flow velocities may be secondary to anemia, thyrotoxicosis,  hyperdynamic or high flow state.   FINDINGS   Left Ventricle: Left ventricular ejection fraction, by estimation, is 60  to 65%. The left ventricle has normal function. The left ventricle has no  regional wall motion abnormalities. The left ventricular internal cavity  size was normal in size. There is   mild left ventricular hypertrophy. Left ventricular diastolic parameters  are consistent with Grade II diastolic dysfunction (pseudonormalization).  Elevated left ventricular end-diastolic pressure.   Right Ventricle: The right ventricular size is mildly enlarged. No  increase in right ventricular wall thickness. Right ventricular systolic  function is normal. Tricuspid regurgitation signal is inadequate for  assessing PA pressure.   Left Atrium: Left atrial size was severely dilated.   Right Atrium: Right atrial size was normal in size.   Pericardium: There is no evidence of pericardial effusion.   Mitral Valve: The mitral valve is  degenerative in appearance. Mild mitral  annular calcification. Trivial mitral valve regurgitation. No evidence of  mitral valve stenosis.   Tricuspid Valve: The tricuspid valve is normal in structure. Tricuspid  valve regurgitation is trivial. No evidence of tricuspid stenosis.   Aortic Valve: The aortic valve is abnormal. There is moderate  calcification of the aortic valve. Aortic valve regurgitation is trivial.  Moderate aortic stenosis is present. Aortic valve mean gradient measures  31.0 mmHg. Aortic valve peak gradient  measures 51.3 mmHg. Aortic valve  area, by VTI measures 1.16 cm.   Pulmonic Valve: The pulmonic valve was normal in structure. Pulmonic valve  regurgitation is trivial. No evidence of pulmonic stenosis.   Aorta: The aortic root is normal in size and structure.   Venous: The inferior vena cava is dilated in size with greater than 50%  respiratory variability, suggesting right atrial pressure of 8 mmHg.   IAS/Shunts: The atrial septum is grossly normal.   Patient Profile     Chelsea [name] is a 57 y.o. female with a hx of chronic COPD who is being seen 09/18/2022 for the evaluation of shortness of breath with moderate aortic stenosis at the request of Dr. Waldron Labs.     Assessment & Plan    Acute on chronic right heart failure [place]   Patient with LVEF 60-65% and grade II diastolic dysfunction on 16/10/96 TTE. Admitted in the setting of acute shortness of breath, found to be volume up with BNP 143.7. Has been receiving IV lasix, initially '80mg'$  BID from 12/18-12/23, then '60mg'$  TID. Received Acetazolamide for possible contraction alkalosis and was subsequently hypokalemic. K now improved. Net output this admission is -18.4 L. Weights do not appear to be accurate/do not correlate with diuresis. No physical exam evidence of acute volume overload at this point, no pulmonary edema.   Continue IV diuresis today '80mg'$  BID, BUN/Creatinine remain stable. Plan  transition to PO loop diuretic tomorrow. Suspect she will respond better to Torsemide.  Continue Spironolactone 12.'5mg'$  QD. BP likely too low to titrate up at this time. Continue Jardiance    Moderate aortic stenosis   Patient with systolic murmur on physical exam, TTE showing moderate AS with peak gradient ~30. Patient should have ongoing outpatient follow up.   Paroxysmal atrial fibrillation   Patient with history of afib, currently in sinus rhythm without observation of afib on telemetry. Continue Eliquis '5mg'$  BID.   Per primary team:   COPD Obesity hypoventilation syndrome      For questions or updates, please contact [place] Please consult [email or web address] for contact info under        Signed, Lily Kocher, PA-C  10/02/2022, 9:22 AM    Personally seen and examined. Agree with above.  Legs appear much improved.  Diminished edema significantly.  Interestingly her creatinine has never increased despite massive diuresis.  I think she still has more to go however this can be handled as an outpatient.  We can switch her over to torsemide 40 mg p.o. twice daily.  Discussed with Dr. Waldron Labs.  Please check a basic metabolic profile in 1 week.  Candee Furbish, MD

## 2022-10-02 NOTE — Discharge Summary (Signed)
Physician Discharge Summary  Chelsea [name] HEN:[id number] DOB: [birth year] DOA: 09/17/2022  PCP: Merryl Hacker, No  Admit date: 09/17/2022 Discharge date: 10/02/2022  Admitted From: (SNF) Disposition:  (SNF )  Recommendations for Outpatient Follow-up:  Please keep encouraging to use incentive spirometer all times and out of bed to chair Check BMP in 1 week, as her home dose torsemide dose has been increased to 40 mg twice daily  Discharge Condition: (Stable) CODE STATUS: (FULL) Diet recommendation: Heart Healthy / Carb Modified, low salt with 1.5 L fluid restrictions. Brief/Interim Summary:  Chelsea [name] is a 57 y.o. F with asthma/COPD, dCHF and OHS and hx tracheostomy now changed, MO with BMI 54, and chronic respiratory failure on 3L home O2, anemia, AF on eliquis, DM, HTN lives in ALF, who presented with .. dyspnea. 12/18: Started on steroids, diuresis 12/19: Cardiology consulted 12/23.  Hypoxia worsening.  Started on continuous BiPAP.  Patient has been refusing BiPAP therapy in the hospital. He was kept on IV diuresis, with significant volume diuresis, respiratory status much improved, back at baseline on 4 to 5 L nasal cannula.   Acute on chronic hypoxic and hypercarbic respiratory failure COPD with acute exacerbation. Obesity hypoventilation syndrome. Patient is chronically on 4 L of oxygen  Patient has been on BiPAP, but has refusing BiPAP therapy. -Received IV Solu-Medrol for COPD, and IV antibiotics, no further urology or antibiotics are needed at time of discharge - hypoxia much improved, she is back to baseline 4 to 5 L nasal cannula, discussed with her importance of incentive spirometer. - please see below discussion regarding diuresis   Acute on chronic diastolic CHF. Right heart failure Echocardiogram in October shows preserved EF and grade 2 diastolic and moderate aortic stenosis. -Patient was seen by cardiology, she was on aggressive IV diuresis, she is -30 L during  and prior hospital stay, discussed with cardiology, she can be discharged on torsemide 40 mg oral twice daily, will continue with home dose Aldactone and low-dose potassium.    Hypokalemia -Repleted,   Left forearm and right leg cellulitis. Completed 5 days of doxycycline therapy.   Moderate aortic stenosis. Monitor for now.  Management per cardiology.  She will eventually need TAVR if she can lose weight   Paroxysmal A-fib. Currently rate controlled. Continue Eliquis.  Not on any rate control medication.   Acute on chronic iron deficiency anemia. H&H relatively stable.     Diabetes mellitus, uncontrolled with hyperglycemia without long-term insulin use Hyperglycemia in the hospital most likely secondary to steroids. Recent hemoglobin A1c 5.2. Started on Farxiga, continue with insulin sliding scale as an outpatient.   Morbid obesity. Body mass index is 50.96 kg/m.  Placing the patient at high risk of poor outcome.   Goals of care conversation. Provided education to the patient with regards to importance of pulmonary toilet, incentive spirometry as well as BiPAP use multiple times. Emphasized that she needs to be working with physical therapy and she needs to be spending more time upright in the chair or in the bed. Explained that with her noncompliance with BiPAP, incentive spirometry as well as spending most of the time in the bed she remains at very high risk for decompensation, and intubation.  Discharge Diagnoses:  Principal Problem:   Acute on chronic respiratory failure with hypoxia (HCC) Active Problems:   Acute on chronic diastolic CHF (congestive heart failure) (HCC)   COPD with acute exacerbation (HCC)   Chronic iron deficiency anemia   Obstructive sleep apnea   Essential  hypertension   GERD   Acquired hypothyroidism   Obesity hypoventilation syndrome (HCC)   Depression   Lymphedema of both lower extremities   DM2 (diabetes mellitus, type 2) (HCC)   Moderate  aortic stenosis   Paroxysmal atrial fibrillation (HCC)   Class 3 severe obesity due to excess calories with serious comorbidity and body mass index (BMI) of 60.0 to 69.9 in adult [facility])    Discharge Instructions  Discharge Instructions     Diet - low sodium heart healthy   Complete by: As directed    Discharge instructions   Complete by: As directed    Follow with Primary MD   Get CBC, CMP,  checked  by Primary MD next visit.    Activity: As tolerated with Full fall precautions use walker/cane & assistance as needed   Disposition SNF   Diet: Heart Healthy  , Follow Cardiac Low Salt Diet and 1.5 lit/day fluid restriction.   On your next visit with your primary care physician please Get Medicines reviewed and adjusted.   Please request your Prim.MD to go over all Hospital Tests and Procedure/Radiological results at the follow up, please get all Hospital records sent to your Prim MD by signing hospital release before you go home.   If you experience worsening of your admission symptoms, develop shortness of breath, life threatening emergency, suicidal or homicidal thoughts you must seek medical attention immediately by calling 911 or calling your MD immediately  if symptoms less severe.  You Must read complete instructions/literature along with all the possible adverse reactions/side effects for all the Medicines you take and that have been prescribed to you. Take any new Medicines after you have completely understood and accpet all the possible adverse reactions/side effects.   Do not drive, operating heavy machinery, perform activities at heights, swimming or participation in water activities or provide baby sitting services if your were admitted for syncope or siezures until you have seen by Primary MD or a Neurologist and advised to do so again.  Do not drive when taking Pain medications.    Do not take more than prescribed Pain, Sleep and Anxiety Medications  Special  Instructions: If you have smoked or chewed Tobacco  in the last 2 yrs please stop smoking, stop any regular Alcohol  and or any Recreational drug use.  Wear Seat belts while driving.   Please note  You were cared for by a hospitalist during your hospital stay. If you have any questions about your discharge medications or the care you received while you were in the hospital after you are discharged, you can call the unit and asked to speak with the hospitalist on call if the hospitalist that took care of you is not available. Once you are discharged, your primary care physician will handle any further medical issues. Please note that NO REFILLS for any discharge medications will be authorized once you are discharged, as it is imperative that you return to your primary care physician (or establish a relationship with a primary care physician if you do not have one) for your aftercare needs so that they can reassess your need for medications and monitor your lab values.   Increase activity slowly   Complete by: As directed       Allergies as of 10/02/2022       Reactions   Iodinated Contrast Media Anaphylaxis   Penicillins Anaphylaxis   Patient tolerates cefepime (08/2017)   Insulin Lispro Other (See Comments)   Adder per  MAR from SNF   Minoxidil    Other reaction(s): hives        Medication List     STOP taking these medications    acetaminophen 325 MG tablet Commonly known as: TYLENOL       TAKE these medications    albuterol (2.5 MG/3ML) 0.083% nebulizer solution Commonly known as: PROVENTIL Take 3 mLs (2.5 mg total) by nebulization every 6 (six) hours as needed for wheezing or shortness of breath (I50.32).   ALPRAZolam 0.5 MG tablet Commonly known as: XANAX Take 1 tablet (0.5 mg total) by mouth every 8 (eight) hours as needed for anxiety. What changed: when to take this   ammonium lactate 12 % lotion Commonly known as: LAC-HYDRIN Apply 1 Application topically every  evening. Apply to  legs and feet every evening shift   budesonide 0.5 MG/2ML nebulizer solution Commonly known as: PULMICORT Inhale 0.5 mg into the lungs every 12 (twelve) hours.   Cholecalciferol 125 MCG (5000 UT) Tabs Take 1 tablet (5,000 Units total) by mouth daily.   cyanocobalamin 1000 MCG tablet Commonly known as: VITAMIN B12 Take 1 tablet (1,000 mcg total) by mouth daily. What changed: Another medication with the same name was removed. Continue taking this medication, and follow the directions you see here.   Eliquis 5 MG Tabs tablet Generic drug: apixaban Take 5 mg by mouth 2 (two) times daily.   empagliflozin 10 MG Tabs tablet Commonly known as: JARDIANCE Take 1 tablet (10 mg total) by mouth daily. Start taking on: October 03, 2022   ferrous sulfate 324 MG Tbec Take 324 mg by mouth daily.   folic acid 1 MG tablet Commonly known as: FOLVITE Take 1 tablet (1 mg total) by mouth daily.   gabapentin 400 MG capsule Commonly known as: NEURONTIN Take 400 mg by mouth 3 (three) times daily.   HYDROcodone-acetaminophen 5-325 MG tablet Commonly known as: NORCO/VICODIN Take 1 tablet by mouth every 6 (six) hours as needed for severe pain (for back and shoulder pain).   insulin aspart 100 UNIT/ML injection Commonly known as: novoLOG insulin aspart (novoLOG) injection 0-24 Units 0-24 Units, Subcutaneous, 3 times daily with meals, CBG < 70: Implement Hypoglycemia measures, call MD CBG < 120 (dose in units): 0 CBG 120 - 160 (dose in units): 2 CBG 161 - 200 (dose in units): 4 CBG 201 - 250 (dose in units): 8 CBG 251 - 300 (dose in units): 12 CBG 301 - 350 (dose in units): 16 CBG 351 - 450 (dose in units): 20 CBG > 450: 24 units and obtain STAT glucose and notify MD   levothyroxine 150 MCG tablet Commonly known as: SYNTHROID Take 1 tablet (150 mcg total) by mouth daily at 6 (six) AM.   multivitamin tablet Take 1 tablet by mouth daily.   NON FORMULARY Apply BIPAP every  night at bedtime.   OXYGEN Inhale 4 L/min into the lungs 2 (two) times daily.   pantoprazole 40 MG tablet Commonly known as: PROTONIX Take 1 tablet (40 mg total) by mouth daily.   potassium chloride SA 20 MEQ tablet Commonly known as: KLOR-CON M Take 1 tablet (20 mEq total) by mouth daily.   spironolactone 25 MG tablet Commonly known as: ALDACTONE Take 0.5 tablets (12.5 mg total) by mouth daily.   torsemide 20 MG tablet Commonly known as: DEMADEX Take 2 tablets (40 mg total) by mouth 2 (two) times daily. What changed: when to take this        Follow-up  Information     [facility]. Go in 16 day(s).   Specialty: Cardiology Why: Hospital follow up on 10/09/2022 @ 12 noon.  PLEASE bring a current medication list to appointment FREE valet parking, Entrance C, off [employer] information: [address] [id number] [place] [address] [phone]               Allergies  Allergen Reactions   Iodinated Contrast Media Anaphylaxis   Penicillins Anaphylaxis    Patient tolerates cefepime (08/2017)   Insulin Lispro Other (See Comments)    Adder per [facility] from SNF   Minoxidil     Other reaction(s): hives    Consultations: cardiology   Procedures/Studies: DG CHEST PORT 1 VIEW  Result Date: 09/22/2022 CLINICAL DATA:  Short of breath. EXAM: PORTABLE CHEST 1 VIEW COMPARISON:  09/17/2022.  CT, 07/29/2022. FINDINGS: Stable enlargement of the cardiac silhouette. No mediastinal or hilar masses. Mild left perihilar and bilateral lung base opacities consistent with atelectasis. Mild central vascular congestion with no overt pulmonary edema. No evidence of pneumonia. No convincing pleural effusion and no pneumothorax. Skeletal structures are grossly intact. IMPRESSION: No acute cardiopulmonary disease. Electronically Signed   By: Lajean Manes M.D.   On: 09/22/2022 12:55   DG Chest Portable 1  View  Result Date: 09/17/2022 CLINICAL DATA:  sob, cough EXAM: PORTABLE CHEST 1 VIEW COMPARISON:  July 29, 2022 FINDINGS: The cardiomediastinal silhouette is unchanged and enlarged in contour.Perihilar vascular fullness with mild peribronchial cuffing. Retrocardiac opacity, likely atelectasis. No pleural effusion. No pneumothorax. IMPRESSION: Constellation of findings are favored to reflect mild pulmonary edema with LEFT basilar atelectasis. Electronically Signed   By: Valentino Saxon M.D.   On: 09/17/2022 17:26      Subjective: Significant events overnight, she denies any complaints today.  Discharge Exam: Vitals:   10/02/22 0800 10/02/22 1117  BP:  99/60  Pulse: 73 68  Resp:  19  Temp:  98.3 F (36.8 C)  SpO2: 96% 94%   Vitals:   10/02/22 0133 10/02/22 0432 10/02/22 0800 10/02/22 1117  BP:  107/60  99/60  Pulse:  (!) 57 73 68  Resp:  18  19  Temp:  98.4 F (36.9 C)  98.3 F (36.8 C)  TempSrc:  Oral  Oral  SpO2:  99% 96% 94%  Weight: (!) 139.8 kg     Height:        General: Pt is alert, awake, not in acute distress Cardiovascular: RRR, S1/S2 +, no rubs, no gallops Respiratory: CTA bilaterally, no wheezing, no rhonchi Abdominal: Soft, NT, ND, bowel sounds + Extremities: Lower extremity edema has resolved, she has chronic lower extremity skin changes    The results of significant diagnostics from this hospitalization (including imaging, microbiology, ancillary and laboratory) are listed below for reference.     Microbiology: No results found for this or any previous visit (from the past 240 hour(s)).   Labs: BNP (last 3 results) Recent Labs    07/04/22 1848 07/29/22 2122 09/17/22 1834  BNP 363.9* 64.3 277.4*   Basic Metabolic Panel: Recent Labs  Lab 09/26/22 0239 09/28/22 0159 09/29/22 0021 09/30/22 0021 10/01/22 0040 10/02/22 0123  NA 140 139 135 138 136 138  K 3.9 4.0 4.0 2.9* 3.9 3.6  CL 85* 82* 83* 87* 89* 91*  CO2 44* >45* 38* 39* 35* 34*   GLUCOSE 83 159* 204* 197* 192* 174*  BUN 27* 29* 32* 30* 30* 29*  CREATININE 0.71 0.76 0.93 0.84 0.94 0.87  CALCIUM 8.8* 9.0 8.7* 9.0 9.2 9.1  MG 2.3 2.3  --   --  2.7*  --    Liver Function Tests: No results for input(s): "AST", "ALT", "ALKPHOS", "BILITOT", "PROT", "ALBUMIN" in the last 168 hours. No results for input(s): "LIPASE", "AMYLASE" in the last 168 hours. No results for input(s): "AMMONIA" in the last 168 hours. CBC: Recent Labs  Lab 09/28/22 0159  WBC 7.8  HGB 10.6*  HCT 37.8  MCV 89.6  PLT 312   Cardiac Enzymes: No results for input(s): "CKTOTAL", "CKMB", "CKMBINDEX", "TROPONINI" in the last 168 hours. BNP: Invalid input(s): "POCBNP" CBG: Recent Labs  Lab 10/01/22 1235 10/01/22 1622 10/01/22 2046 10/02/22 0548 10/02/22 1114  GLUCAP 213* 184* 176* 146* 302*   D-Dimer No results for input(s): "DDIMER" in the last 72 hours. Hgb A1c No results for input(s): "HGBA1C" in the last 72 hours. Lipid Profile No results for input(s): "CHOL", "HDL", "LDLCALC", "TRIG", "CHOLHDL", "LDLDIRECT" in the last 72 hours. Thyroid function studies No results for input(s): "TSH", "T4TOTAL", "T3FREE", "THYROIDAB" in the last 72 hours.  Invalid input(s): "FREET3" Anemia work up No results for input(s): "VITAMINB12", "FOLATE", "FERRITIN", "TIBC", "IRON", "RETICCTPCT" in the last 72 hours. Urinalysis    Component Value Date/Time   COLORURINE STRAW (A) 07/04/2022 2300   APPEARANCEUR CLEAR 07/04/2022 2300   LABSPEC 1.008 07/04/2022 2300   PHURINE 6.0 07/04/2022 2300   GLUCOSEU NEGATIVE 07/04/2022 2300   HGBUR NEGATIVE 07/04/2022 2300   BILIRUBINUR NEGATIVE 07/04/2022 2300   KETONESUR NEGATIVE 07/04/2022 2300   PROTEINUR NEGATIVE 07/04/2022 2300   UROBILINOGEN 0.2 12/08/2010 0545   NITRITE NEGATIVE 07/04/2022 2300   LEUKOCYTESUR NEGATIVE 07/04/2022 2300   Sepsis Labs Recent Labs  Lab 09/28/22 0159  WBC 7.8   Microbiology No results found for this or any previous  visit (from the past 240 hour(s)).   Time coordinating discharge: Over 30 minutes  SIGNED:   Phillips Climes, MD  [facility] 10/02/2022, 11:46 AM Pager   If 7PM-7AM, please contact night-coverage [email or web address]

## 2022-10-02 NOTE — TOC Transition Note (Signed)
Transition of Care [facility]) - CM/SW Discharge Note   Patient Details  Name: Chelsea [name] MRN: [id number] Date of Birth: May 30, 1966  Transition of Care [facility]) CM/SW Contact:  Bethann Berkshire, [place] Phone Number: 10/02/2022, 11:57 AM   Clinical Narrative:     CSW discussed [place] denial with [name] leadership and discuss further with [name] liaison. [name] will be able to accept pt back today for continued STR though will be seeking an external SNF for LTC as [facility] does not have L[facility] beds. CSW notified patient.   Patient will DC to: [name] Anticipated DC date: 10/02/22 Transport by: [name]   Per MD patient ready for DC to [name]. RN, patient, and facility notified of DC. Discharge Summary and FL2 sent to facility. RN to call report prior to discharge [phone] Room 107). DC packet on chart. Ambulance transport requested for patient.   CSW will sign off for now as social work intervention is no longer needed. Please consult [place] again if new needs arise.   Final next level of care: [name] to Discharge: No Barriers Identified   Patient Goals and CMS Choice      Discharge Placement                Patient chooses bed at: [name] Patient to be transferred to facility by: [facility]   Patient and family notified of of transfer: 10/02/22  Discharge Plan and Services Additional resources added to the After Visit Summary for                                       Social Determinants of Health (SDOH) Interventions SDOH Screenings   Food Insecurity: No Food Insecurity (07/19/2022)  Housing: Low Risk  (07/19/2022)  Transportation Needs: No Transportation Needs (07/19/2022)  Utilities: Not At Risk (07/19/2022)  Alcohol Screen: Low Risk  (07/05/2022)  Financial Resource Strain: Low Risk  (07/05/2022)  Tobacco Use: Low Risk  (09/17/2022)     Readmission Risk Interventions    07/23/2022    2:19 PM 04/09/2022    4:17 PM   Readmission Risk Prevention Plan  Transportation Screening Complete Complete  PCP or Specialist Appt within 3-5 Days  Complete  HRI or [place]  Complete  Social Work Consult for [place] Planning/Counseling  [place] Screening  Not Applicable  Medication Review Press photographer) Complete Referral to Pharmacy  PCP or Specialist appointment within 3-5 days of discharge Complete   HRI or [place] Complete   SW Recovery Care/Counseling Consult Complete   Palliative Care Screening Not [place] Complete

## 2022-10-09 ENCOUNTER — Encounter (HOSPITAL_COMMUNITY): Payer: Medicare Other

## 2022-10-09 ENCOUNTER — Telehealth (HOSPITAL_COMMUNITY): Payer: Self-pay | Admitting: *Deleted

## 2022-10-09 NOTE — Telephone Encounter (Signed)
Call attempted to confirm [facility] TOC appt 10/09/22 at 12 noon. HIPPA appropriate VM left with callback number.    Earnestine Leys, BSN, Clinical cytogeneticist Only

## 2022-10-09 NOTE — Progress Notes (Incomplete)
HEART & VASCULAR TRANSITION OF CARE CONSULT NOTE     Referring Physician: Primary Care: Primary Cardiologist: Dr. Geraldo Pitter  HPI: Referred to clinic by *** for heart failure consultation. 57 y.o. female with hx of HFpEF, aortic valve stenosis, paroxysmal atrial fibrillation/flutter (ablation planned at [facility] in 10/22 but lost to follow-up), chronic respiratory failure on continuous O2, morbid obesity/obesity hypoventilation syndrome with prior trach (reverted in fall 2023), OSA not compliant with BiPAP, COPD, upper GI bleed 10/23.   She's had multiple admissions for a/c respiratory failure 2/2 OHS/OSA and volume overloaded.   She was admitted with volume overload 07/29/22 after a recent GI bleed earlier in the month requiring multiple transfusions. She was discharged to SNF for rehab.  Echo 10/23: EF 60-65%, grade II DD< RV mildly enlarged with okay function, TR signal inadequate to assess PA pressure, moderate aortic valve stenosis with mean gradient of 31 mmHg, AVA 1.16 cm2 (gradients thought to be elevated d/t increased CO from anemia and obesity).  Readmitted 12/23 with a/c respiratory failure with hypoxia 2/2 a/c diastolic CHF w/ RV failure and AECOPD. She was strated with IV lasix, IV steroids and abx. Course c/b worsening hypoxia, refused BiPAP during admission.   Cardiac Testing    Review of Systems: [y] = yes, '[ ]'$  = no   General: Weight gain '[ ]'$ ; Weight loss '[ ]'$ ; Anorexia '[ ]'$ ; Fatigue '[ ]'$ ; Fever '[ ]'$ ; Chills '[ ]'$ ; Weakness '[ ]'$   Cardiac: Chest pain/pressure '[ ]'$ ; Resting SOB '[ ]'$ ; Exertional SOB '[ ]'$ ; Orthopnea '[ ]'$ ; Pedal Edema '[ ]'$ ; Palpitations '[ ]'$ ; Syncope '[ ]'$ ; Presyncope '[ ]'$ ; Paroxysmal nocturnal dyspnea'[ ]'$   Pulmonary: Cough '[ ]'$ ; Wheezing'[ ]'$ ; Hemoptysis'[ ]'$ ; Sputum '[ ]'$ ; Snoring '[ ]'$   GI: Vomiting'[ ]'$ ; Dysphagia'[ ]'$ ; Melena'[ ]'$ ; Hematochezia '[ ]'$ ; Heartburn'[ ]'$ ; Abdominal pain '[ ]'$ ; Constipation '[ ]'$ ; Diarrhea '[ ]'$ ; BRBPR '[ ]'$   GU: Hematuria'[ ]'$ ; Dysuria '[ ]'$ ; Nocturia'[ ]'$   Vascular: Pain in  legs with walking '[ ]'$ ; Pain in feet with lying flat '[ ]'$ ; Non-healing sores '[ ]'$ ; Stroke '[ ]'$ ; TIA '[ ]'$ ; Slurred speech '[ ]'$ ;  Neuro: Headaches'[ ]'$ ; Vertigo'[ ]'$ ; Seizures'[ ]'$ ; Paresthesias'[ ]'$ ;Blurred vision '[ ]'$ ; Diplopia '[ ]'$ ; Vision changes '[ ]'$   Ortho/Skin: Arthritis '[ ]'$ ; Joint pain '[ ]'$ ; Muscle pain '[ ]'$ ; Joint swelling '[ ]'$ ; Back Pain '[ ]'$ ; Rash '[ ]'$   Psych: Depression'[ ]'$ ; Anxiety'[ ]'$   Heme: Bleeding problems '[ ]'$ ; Clotting disorders '[ ]'$ ; Anemia '[ ]'$   Endocrine: Diabetes '[ ]'$ ; Thyroid dysfunction'[ ]'$    Past Medical History:  Diagnosis Date   Aortic stenosis    Echocardiogram 02/21/11:  Mean gradient 23 mm of mercury; peak gradient 36;; Turbulence noted in the area of the aortic isthmus with peak gradient 23 mmHg-consider MRI to assess for coarctation;    TEE: 04/02/11:  EF 55-60%, mod BAE, trileaflet AV with mild AS (pk and mean 25 and 16), mild AI, mild MR, mild TR, atrial septal aneurysm, no evidence of corarctation   Arthritis    "qwhere" (12/05/2017)   Asthma    Cervical cancer ([place]) 2006   CHF (congestive heart failure) (HCC)    Chronic lower back pain    Complication of anesthesia    "I have a hard time waking up from under it" (12/05/2017)   COPD (chronic obstructive pulmonary disease) (HCC)    "lung dr said I don't have this" (12/05/2017)   Coronary artery disease    Diastolic congestive heart  failure ([place])    Echo 02/21/11: EF 55-60%, moderate LVH, grade 2 diastolic dysfunction, mild to moderate aortic stenosis with a mean gradient 23 mm of mercury   GERD (gastroesophageal reflux disease)    Heart murmur    Hepatitis C    History of gout X 1   Hyperlipidemia    Hypertension    Hypotension arterial 04/07/2022   Hypothyroid    IBS (irritable bowel syndrome)    Migraine    "monthly" (12/05/2017)   Obesity hypoventilation syndrome ([place])    On home oxygen therapy    "2L all the time" (12/05/2017)   OSA treated with BiPAP    "have CPAP at home too; wearing BiPAP right now" (12/05/2017)   Pneumonia     "several times" (12/05/2017)   Type II diabetes mellitus (HCC)     Current Outpatient Medications  Medication Sig Dispense Refill   albuterol (PROVENTIL) (2.5 MG/3ML) 0.083% nebulizer solution Take 3 mLs (2.5 mg total) by nebulization every 6 (six) hours as needed for wheezing or shortness of breath (I50.32). 75 mL 12   ALPRAZolam (XANAX) 0.5 MG tablet Take 1 tablet (0.5 mg total) by mouth every 8 (eight) hours as needed for anxiety. 10 tablet 0   ammonium lactate (LAC-HYDRIN) 12 % lotion Apply 1 Application topically every evening. Apply to  legs and feet every evening shift     budesonide (PULMICORT) 0.5 MG/2ML nebulizer solution Inhale 0.5 mg into the lungs every 12 (twelve) hours.     Cholecalciferol 125 MCG (5000 UT) TABS Take 1 tablet (5,000 Units total) by mouth daily. 30 tablet    cyanocobalamin (VITAMIN B12) 1000 MCG tablet Take 1 tablet (1,000 mcg total) by mouth daily.     ELIQUIS 5 MG TABS tablet Take 5 mg by mouth 2 (two) times daily.     empagliflozin (JARDIANCE) 10 MG TABS tablet Take 1 tablet (10 mg total) by mouth daily. 30 tablet    ferrous sulfate 324 MG TBEC Take 324 mg by mouth daily.     folic acid (FOLVITE) 1 MG tablet Take 1 tablet (1 mg total) by mouth daily.     gabapentin (NEURONTIN) 400 MG capsule Take 400 mg by mouth 3 (three) times daily.     HYDROcodone-acetaminophen (NORCO/VICODIN) 5-325 MG tablet Take 1 tablet by mouth every 6 (six) hours as needed for severe pain (for back and shoulder pain). 10 tablet 0   insulin aspart (NOVOLOG) 100 UNIT/ML injection insulin aspart (novoLOG) injection 0-24 Units 0-24 Units, Subcutaneous, 3 times daily with meals, CBG < 70: Implement Hypoglycemia measures, call MD CBG < 120 (dose in units): 0 CBG 120 - 160 (dose in units): 2 CBG 161 - 200 (dose in units): 4 CBG 201 - 250 (dose in units): 8 CBG 251 - 300 (dose in units): 12 CBG 301 - 350 (dose in units): 16 CBG 351 - 450 (dose in units): 20 CBG > 450: 24 units and obtain  STAT glucose and notify MD 10 mL 11   levothyroxine (SYNTHROID) 150 MCG tablet Take 1 tablet (150 mcg total) by mouth daily at 6 (six) AM.     Multiple Vitamin (MULTIVITAMIN) tablet Take 1 tablet by mouth daily.     NON FORMULARY Apply BIPAP every night at bedtime.     OXYGEN Inhale 4 L/min into the lungs 2 (two) times daily.     pantoprazole (PROTONIX) 40 MG tablet Take 1 tablet (40 mg total) by mouth daily.     potassium  chloride SA (KLOR-CON M) 20 MEQ tablet Take 1 tablet (20 mEq total) by mouth daily.     spironolactone (ALDACTONE) 25 MG tablet Take 0.5 tablets (12.5 mg total) by mouth daily. 15 tablet 0   torsemide (DEMADEX) 20 MG tablet Take 2 tablets (40 mg total) by mouth 2 (two) times daily.     No current facility-administered medications for this visit.    Allergies  Allergen Reactions   Iodinated Contrast Media Anaphylaxis   Penicillins Anaphylaxis    Patient tolerates cefepime (08/2017)   Insulin Lispro Other (See Comments)    Adder per MAR from SNF   Minoxidil     Other reaction(s): hives      Social History   Socioeconomic History   Marital status: Significant Other    Spouse name: Not on file   Number of children: Not on file   Years of education: Not on file   Highest education level: Not on file  Occupational History   Occupation: disabled  Tobacco Use   Smoking status: Never   Smokeless tobacco: Never  Vaping Use   Vaping Use: Never used  Substance and Sexual Activity   Alcohol use: Not Currently   Drug use: No   Sexual activity: Not Currently  Other Topics Concern   Not on file  Social History Narrative   Not on file   Social Determinants of Health   Financial Resource Strain: Low Risk  (07/05/2022)   Overall Financial Resource Strain (CARDIA)    Difficulty of Paying Living Expenses: Not very hard  Food Insecurity: No Food Insecurity (07/19/2022)   Hunger Vital Sign    Worried About Running Out of Food in the Last Year: Never true    Ran Out  of Food in the Last Year: Never true  Transportation Needs: No Transportation Needs (07/19/2022)   PRAPARE - Hydrologist (Medical): No    Lack of Transportation (Non-Medical): No  Physical Activity: Not on file  Stress: Not on file  Social Connections: Not on file  Intimate Partner Violence: Not At Risk (07/19/2022)   Humiliation, Afraid, Rape, and Kick questionnaire    Fear of Current or Ex-Partner: No    Emotionally Abused: No    Physically Abused: No    Sexually Abused: No      Family History  Problem Relation Age of Onset   Heart disease Father     There were no vitals filed for this visit.  PHYSICAL EXAM: General:  Well appearing. No respiratory difficulty HEENT: normal Neck: supple. no JVD. Carotids 2+ bilat; no bruits. No lymphadenopathy or thryomegaly appreciated. Cor: PMI nondisplaced. Regular rate & rhythm. No rubs, gallops or murmurs. Lungs: clear Abdomen: soft, nontender, nondistended. No hepatosplenomegaly. No bruits or masses. Good bowel sounds. Extremities: no cyanosis, clubbing, rash, edema Neuro: alert & oriented x 3, cranial nerves grossly intact. moves all 4 extremities w/o difficulty. Affect pleasant.  ECG:   ASSESSMENT & PLAN: Chronic diastolic CHF with RV failure: NYHA *** GDMT  Diuretic- BB- Ace/ARB/ARNI MRA SGLT2i  Obesity hypoventilation syndrome OSA  Paroxysmal atrial fibrillation/flutter    Referred to HFSW (PCP, Medications, Transportation, ETOH Abuse, Drug Abuse, Insurance, Financial ): Yes or No Refer to Pharmacy: Yes or No Refer to Home Health: Yes on No Refer to Advanced Heart Failure Clinic: Yes or no  Refer to General Cardiology: Yes or No  Follow up

## 2022-10-16 ENCOUNTER — Telehealth (HOSPITAL_COMMUNITY): Payer: Self-pay

## 2022-10-16 NOTE — Telephone Encounter (Signed)
Called to confirm Heart & Vascular Transitions of Care appointment at 10/17/22 @ 11:00am.  Left message to confirm appointment.

## 2022-10-17 ENCOUNTER — Ambulatory Visit (HOSPITAL_COMMUNITY)
Admission: RE | Admit: 2022-10-17 | Discharge: 2022-10-17 | Disposition: A | Discharge status: Home / Self Care | Payer: Medicare Other | Source: Ambulatory Visit | Attending: Cardiology | Admitting: Cardiology

## 2022-10-17 VITALS — BP 136/78 | HR 90

## 2022-10-17 DIAGNOSIS — E669 Obesity, unspecified: Secondary | ICD-10-CM | POA: Insufficient documentation

## 2022-10-17 DIAGNOSIS — J441 Chronic obstructive pulmonary disease with (acute) exacerbation: Secondary | ICD-10-CM | POA: Diagnosis not present

## 2022-10-17 DIAGNOSIS — Z7901 Long term (current) use of anticoagulants: Secondary | ICD-10-CM | POA: Diagnosis not present

## 2022-10-17 DIAGNOSIS — J9611 Chronic respiratory failure with hypoxia: Secondary | ICD-10-CM | POA: Insufficient documentation

## 2022-10-17 DIAGNOSIS — I5032 Chronic diastolic (congestive) heart failure: Secondary | ICD-10-CM

## 2022-10-17 DIAGNOSIS — E662 Morbid (severe) obesity with alveolar hypoventilation: Secondary | ICD-10-CM | POA: Insufficient documentation

## 2022-10-17 DIAGNOSIS — I480 Paroxysmal atrial fibrillation: Secondary | ICD-10-CM | POA: Diagnosis not present

## 2022-10-17 DIAGNOSIS — I4892 Unspecified atrial flutter: Secondary | ICD-10-CM | POA: Diagnosis not present

## 2022-10-17 DIAGNOSIS — Z7984 Long term (current) use of oral hypoglycemic drugs: Secondary | ICD-10-CM | POA: Insufficient documentation

## 2022-10-17 DIAGNOSIS — I5033 Acute on chronic diastolic (congestive) heart failure: Secondary | ICD-10-CM | POA: Diagnosis not present

## 2022-10-17 DIAGNOSIS — I350 Nonrheumatic aortic (valve) stenosis: Secondary | ICD-10-CM | POA: Diagnosis not present

## 2022-10-17 DIAGNOSIS — Z79899 Other long term (current) drug therapy: Secondary | ICD-10-CM | POA: Diagnosis not present

## 2022-10-17 DIAGNOSIS — Z9981 Dependence on supplemental oxygen: Secondary | ICD-10-CM | POA: Insufficient documentation

## 2022-10-17 DIAGNOSIS — I110 Hypertensive heart disease with heart failure: Secondary | ICD-10-CM | POA: Insufficient documentation

## 2022-10-17 LAB — BASIC METABOLIC PANEL
Anion gap: 13 (ref 5–15)
BUN: 20 mg/dL (ref 6–20)
CO2: 38 mmol/L — ABNORMAL HIGH (ref 22–32)
Calcium: 8.7 mg/dL — ABNORMAL LOW (ref 8.9–10.3)
Chloride: 86 mmol/L — ABNORMAL LOW (ref 98–111)
Creatinine, Ser: 0.63 mg/dL (ref 0.44–1.00)
GFR, Estimated: >60 mL/min (ref >60)
Glucose, Bld: 197 mg/dL — ABNORMAL HIGH (ref 70–99)
Potassium: 3.7 mmol/L (ref 3.5–5.1)
Sodium: 137 mmol/L (ref 135–145)

## 2022-10-17 LAB — BRAIN NATRIURETIC PEPTIDE: B Natriuretic Peptide: 37.2 pg/mL (ref 0.0–100.0)

## 2022-10-17 MED ORDER — SPIRONOLACTONE 25 MG PO TABS: 25.0000 mg | ORAL_TABLET | Freq: Every day | ORAL | 0 refills | Status: DC

## 2022-10-17 NOTE — Patient Instructions (Signed)
Labs done today. We will contact you only if your labs are abnormal.  INCREASE Spironolactone to '25mg'$  (1 tablet) by mouth daily.   No other medication changes were made. Please continue all current medications as prescribed.  Thank you for allowing [place] to provide your heart failure care after your recent hospitalization. Please follow-up with General Cardiology.

## 2022-10-17 NOTE — Progress Notes (Signed)
HEART & VASCULAR TRANSITION OF CARE CONSULT NOTE     Referring Physician:  Primary Care: Pcp, No Primary Cardiologist: Dr. Geraldo Pitter  HPI: Referred to clinic by Dr. Waldron Labs for heart failure consultation.   57 y.o. female with hx of HFpEF, aortic valve stenosis, paroxysmal atrial fibrillation/flutter (ablation planned at [facility] in 10/22 but lost to follow-up), chronic respiratory failure on continuous O2, morbid obesity/obesity hypoventilation syndrome with prior trach (reverted in fall 2023), OSA not compliant with BiPAP, COPD, upper GI bleed 10/23.   She's had multiple admissions for a/c respiratory failure 2/2 OHS/OSA and volume overloaded.   She was admitted with volume overload 07/29/22 after a recent GI bleed earlier in the month requiring multiple transfusions. She was discharged to SNF for rehab.  Echo 10/23: EF 60-65%, grade II DD< RV mildly enlarged with okay function, TR signal inadequate to assess PA pressure, moderate aortic valve stenosis with mean gradient of 31 mmHg, AVA 1.16 cm2 (gradients thought to be elevated d/t increased CO from anemia and obesity).  Readmitted 12/23 with a/c respiratory failure with hypoxia 2/2 a/c diastolic CHF w/ RV failure and AECOPD. She was treated with IV lasix, IV steroids and abx. Course c/b worsening hypoxia, refused BiPAP during admission. She was in NSR throughout admission. After diuresis w/ IV Lasix, symptoms improved and  was transitioned to PO torsemide, spiro and Jardiance. D/c back to SNF.   Presents to [facility] clinic today for assessment. WC bound, not very mobile. Unable to get out of WC for standing wt. Has to do transfers w/ use of a lift. Struggles w/ joint pain and arthritis. Denies resting dyspnea. On chronic supp O2 at 4L/min. Comfortable on this rate. Denies CP. No palpitations. Reports good UOP w/ diuretics. Denies urinary/GU issues w/ Jardiance. BP 136/78. RRR on exam.     Cardiac Testing   2D Echo 10/23 EF 60-65%, RV  mildly enlarged w/ normal systolic fx, mod AS MG 31 mmHg    Review of Systems: [y] = yes, '[ ]'$  = no   General: Weight gain '[ ]'$ ; Weight loss '[ ]'$ ; Anorexia '[ ]'$ ; Fatigue '[ ]'$ ; Fever '[ ]'$ ; Chills '[ ]'$ ; Weakness '[ ]'$   Cardiac: Chest pain/pressure '[ ]'$ ; Resting SOB '[ ]'$ ; Exertional SOB '[ ]'$ ; Orthopnea '[ ]'$ ; Pedal Edema '[ ]'$ ; Palpitations '[ ]'$ ; Syncope '[ ]'$ ; Presyncope '[ ]'$ ; Paroxysmal nocturnal dyspnea'[ ]'$   Pulmonary: Cough '[ ]'$ ; Wheezing'[ ]'$ ; Hemoptysis'[ ]'$ ; Sputum '[ ]'$ ; Snoring '[ ]'$   GI: Vomiting'[ ]'$ ; Dysphagia'[ ]'$ ; Melena'[ ]'$ ; Hematochezia '[ ]'$ ; Heartburn'[ ]'$ ; Abdominal pain '[ ]'$ ; Constipation '[ ]'$ ; Diarrhea '[ ]'$ ; BRBPR '[ ]'$   GU: Hematuria'[ ]'$ ; Dysuria '[ ]'$ ; Nocturia'[ ]'$   Vascular: Pain in legs with walking '[ ]'$ ; Pain in feet with lying flat '[ ]'$ ; Non-healing sores '[ ]'$ ; Stroke '[ ]'$ ; TIA '[ ]'$ ; Slurred speech '[ ]'$ ;  Neuro: Headaches'[ ]'$ ; Vertigo'[ ]'$ ; Seizures'[ ]'$ ; Paresthesias'[ ]'$ ;Blurred vision '[ ]'$ ; Diplopia '[ ]'$ ; Vision changes '[ ]'$   Ortho/Skin: Arthritis '[ ]'$ ; Joint pain '[ ]'$ ; Muscle pain '[ ]'$ ; Joint swelling [ Y]; Back Pain '[ ]'$ ; Rash '[ ]'$   Psych: Depression'[ ]'$ ; Anxiety'[ ]'$   Heme: Bleeding problems '[ ]'$ ; Clotting disorders '[ ]'$ ; Anemia '[ ]'$   Endocrine: Diabetes [Y ]; Thyroid dysfunction'[ ]'$    Past Medical History:  Diagnosis Date   Aortic stenosis    Echocardiogram 02/21/11:  Mean gradient 23 mm of mercury; peak gradient 36;; Turbulence noted in the area of the aortic isthmus with peak  gradient 23 mmHg-consider MRI to assess for coarctation;    TEE: 04/02/11:  EF 55-60%, mod BAE, trileaflet AV with mild AS (pk and mean 25 and 16), mild AI, mild MR, mild TR, atrial septal aneurysm, no evidence of corarctation   Arthritis    "qwhere" (12/05/2017)   Asthma    Cervical cancer ([place]) 2006   CHF (congestive heart failure) (HCC)    Chronic lower back pain    Complication of anesthesia    "I have a hard time waking up from under it" (12/05/2017)   COPD (chronic obstructive pulmonary disease) ([place])    "lung dr said I don't have this" (12/05/2017)    Coronary artery disease    Diastolic congestive heart failure ([place])    Echo 02/21/11: EF 55-60%, moderate LVH, grade 2 diastolic dysfunction, mild to moderate aortic stenosis with a mean gradient 23 mm of mercury   GERD (gastroesophageal reflux disease)    Heart murmur    Hepatitis C    History of gout X 1   Hyperlipidemia    Hypertension    Hypotension arterial 04/07/2022   Hypothyroid    IBS (irritable bowel syndrome)    Migraine    "monthly" (12/05/2017)   Obesity hypoventilation syndrome ([place])    On home oxygen therapy    "2L all the time" (12/05/2017)   OSA treated with BiPAP    "have CPAP at home too; wearing BiPAP right now" (12/05/2017)   Pneumonia    "several times" (12/05/2017)   Type II diabetes mellitus (HCC)     Current Outpatient Medications  Medication Sig Dispense Refill   albuterol (PROVENTIL) (2.5 MG/3ML) 0.083% nebulizer solution Take 3 mLs (2.5 mg total) by nebulization every 6 (six) hours as needed for wheezing or shortness of breath (I50.32). 75 mL 12   ALPRAZolam (XANAX) 0.5 MG tablet Take 1 tablet (0.5 mg total) by mouth every 8 (eight) hours as needed for anxiety. 10 tablet 0   ammonium lactate (LAC-HYDRIN) 12 % lotion Apply 1 Application topically every evening. Apply to  legs and feet every evening shift     budesonide (PULMICORT) 0.5 MG/2ML nebulizer solution Inhale 0.5 mg into the lungs every 12 (twelve) hours.     Cholecalciferol 125 MCG (5000 UT) TABS Take 1 tablet (5,000 Units total) by mouth daily. 30 tablet    cyanocobalamin (VITAMIN B12) 1000 MCG tablet Take 1 tablet (1,000 mcg total) by mouth daily.     ELIQUIS 5 MG TABS tablet Take 5 mg by mouth 2 (two) times daily.     empagliflozin (JARDIANCE) 10 MG TABS tablet Take 1 tablet (10 mg total) by mouth daily. 30 tablet    empagliflozin (JARDIANCE) 10 MG TABS tablet Take 10 mg by mouth daily.     ferrous sulfate 324 MG TBEC Take 324 mg by mouth daily.     folic acid (FOLVITE) 1 MG tablet Take 1 tablet (1 mg  total) by mouth daily.     gabapentin (NEURONTIN) 400 MG capsule Take 400 mg by mouth 3 (three) times daily.     HYDROcodone-acetaminophen (NORCO/VICODIN) 5-325 MG tablet Take 1 tablet by mouth every 6 (six) hours as needed for severe pain (for back and shoulder pain). 10 tablet 0   insulin aspart (NOVOLOG) 100 UNIT/ML injection insulin aspart (novoLOG) injection 0-24 Units 0-24 Units, Subcutaneous, 3 times daily with meals, CBG < 70: Implement Hypoglycemia measures, call MD CBG < 120 (dose in units): 0 CBG 120 - 160 (dose in units):  2 CBG 161 - 200 (dose in units): 4 CBG 201 - 250 (dose in units): 8 CBG 251 - 300 (dose in units): 12 CBG 301 - 350 (dose in units): 16 CBG 351 - 450 (dose in units): 20 CBG > 450: 24 units and obtain STAT glucose and notify MD 10 mL 11   levothyroxine (SYNTHROID) 150 MCG tablet Take 1 tablet (150 mcg total) by mouth daily at 6 (six) AM.     Multiple Vitamin (MULTIVITAMIN) tablet Take 1 tablet by mouth daily.     NON FORMULARY Apply BIPAP every night at bedtime.     omeprazole (PRILOSEC) 20 MG capsule Take 20 mg by mouth daily.     OXYGEN Inhale 4 L/min into the lungs 2 (two) times daily.     potassium chloride SA (KLOR-CON M) 20 MEQ tablet Take 1 tablet (20 mEq total) by mouth daily.     spironolactone (ALDACTONE) 25 MG tablet Take 0.5 tablets (12.5 mg total) by mouth daily. 15 tablet 0   torsemide (DEMADEX) 20 MG tablet Take 2 tablets (40 mg total) by mouth 2 (two) times daily.     No current facility-administered medications for this encounter.    Allergies  Allergen Reactions   Iodinated Contrast Media Anaphylaxis   Penicillins Anaphylaxis    Patient tolerates cefepime (08/2017)   Insulin Lispro Other (See Comments)    Adder per MAR from SNF   Minoxidil     Other reaction(s): hives      Social History   Socioeconomic History   Marital status: Significant Other    Spouse name: Not on file   Number of children: Not on file   Years of  education: Not on file   Highest education level: Not on file  Occupational History   Occupation: disabled  Tobacco Use   Smoking status: Never   Smokeless tobacco: Never  Vaping Use   Vaping Use: Never used  Substance and Sexual Activity   Alcohol use: Not Currently   Drug use: No   Sexual activity: Not Currently  Other Topics Concern   Not on file  Social History Narrative   Not on file   Social Determinants of Health   Financial Resource Strain: Low Risk  (07/05/2022)   Overall Financial Resource Strain (CARDIA)    Difficulty of Paying Living Expenses: Not very hard  Food Insecurity: No Food Insecurity (07/19/2022)   Hunger Vital Sign    Worried About Running Out of Food in the Last Year: Never true    Ran Out of Food in the Last Year: Never true  Transportation Needs: No Transportation Needs (07/19/2022)   PRAPARE - Hydrologist (Medical): No    Lack of Transportation (Non-Medical): No  Physical Activity: Not on file  Stress: Not on file  Social Connections: Not on file  Intimate Partner Violence: Not At Risk (07/19/2022)   Humiliation, Afraid, Rape, and Kick questionnaire    Fear of Current or Ex-Partner: No    Emotionally Abused: No    Physically Abused: No    Sexually Abused: No      Family History  Problem Relation Age of Onset   Heart disease Father     Vitals:   10/17/22 1138  BP: 136/78  Pulse: 90  SpO2: 96%    PHYSICAL EXAM: General:  super morbidly obese, in WF, chronically ill appearing. No respiratory difficulty HEENT: normal Neck: supple. Thick neck, JVD not well visualized. Carotids 2+ bilat;  no bruits. No lymphadenopathy or thryomegaly appreciated. Cor: PMI nondisplaced. Regular rate & rhythm. No rubs, gallops or murmurs. Lungs: clear Abdomen: obese, soft, nontender, nondistended. No hepatosplenomegaly. No bruits or masses. Good bowel sounds. Extremities: no cyanosis, clubbing, rash, trace b/l LE edema w/  chronic venous stasis dermatitis  Neuro: alert & oriented x 3, cranial nerves grossly intact. moves all 4 extremities w/o difficulty. Affect pleasant.  ECG: not performed    ASSESSMENT & PLAN: 1. Chronic diastolic CHF with RV failure: - 2D Echo 10/23 EF 60-65%, RV normal, mod AS MG 31 mmHg - poor functional status at baseline due to obesity/deconditioning. Fairly sedentary. On chronic home O2 4/Lmin, stable w/o increased resting dyspnea. + trace b/l LE on exam  - Check BMP and BNP today  - Increase spironolactone to 25 mg daily  - Continue Torsemide 40 mg bid  - Continue Jardiance 10 mg (denies GU issues)   2. Obesity hypoventilation syndrome/ OSA - reports nightly compliance w/ BiPAP   3. Paroxysmal atrial fibrillation/flutter - RRR on exam  - Eliquis 5 mg bid   4. Aortic Stenosis - moderate on most recent echo 10/23, mG 31 mmHg  - needs to be followed annually by surveillance echos - I think given her obesity and other comorbidities/poor b/l functional status, she would not be a good surgical candidate but may consider possible TAVR if progression   5. Chronic Hypoxic Respiratory Failure - CHF + OSA/OHS - dieretics and supp O2 per above   6. Obesity  - was previously on Ozempic, she is uncertain why this was stopped  - I advise that she discuss restart of an GLP-1 w/ her PCP   NYHA Unable to ascertain (sedentary/ WC dependent at baseline)  GDMT  Diuretic- Torsemide 40 mg daily  BB- no  Ace/ARB/ARNI no  MRA spironolactone 25 mg daily  SGLT2i Jardiance 10 mg daily   Referred to HFSW (PCP, Medications, Transportation, ETOH Abuse, Drug Abuse, Insurance, Financial ): No Refer to Pharmacy: No Refer to Home Health:  No Refer to Advanced Heart Failure Clinic: No  Refer to General Cardiology: Yes (Dr. Geraldo Pitter)  Follow up  w/ Dr. Geraldo Pitter in 4 wks

## 2022-11-13 DIAGNOSIS — J189 Pneumonia, unspecified organism: Secondary | ICD-10-CM | POA: Insufficient documentation

## 2022-11-13 DIAGNOSIS — I5030 Unspecified diastolic (congestive) heart failure: Secondary | ICD-10-CM | POA: Insufficient documentation

## 2022-11-13 DIAGNOSIS — Z8739 Personal history of other diseases of the musculoskeletal system and connective tissue: Secondary | ICD-10-CM | POA: Insufficient documentation

## 2022-11-13 DIAGNOSIS — Z9981 Dependence on supplemental oxygen: Secondary | ICD-10-CM | POA: Insufficient documentation

## 2022-11-13 DIAGNOSIS — T8859XA Other complications of anesthesia, initial encounter: Secondary | ICD-10-CM | POA: Insufficient documentation

## 2022-11-13 DIAGNOSIS — G8929 Other chronic pain: Secondary | ICD-10-CM | POA: Insufficient documentation

## 2022-11-13 DIAGNOSIS — J45909 Unspecified asthma, uncomplicated: Secondary | ICD-10-CM | POA: Insufficient documentation

## 2022-11-13 DIAGNOSIS — G4733 Obstructive sleep apnea (adult) (pediatric): Secondary | ICD-10-CM | POA: Insufficient documentation

## 2022-11-13 DIAGNOSIS — G43909 Migraine, unspecified, not intractable, without status migrainosus: Secondary | ICD-10-CM | POA: Insufficient documentation

## 2022-11-13 DIAGNOSIS — I10 Essential (primary) hypertension: Secondary | ICD-10-CM | POA: Insufficient documentation

## 2022-11-13 DIAGNOSIS — I2510 Atherosclerotic heart disease of native coronary artery without angina pectoris: Secondary | ICD-10-CM | POA: Insufficient documentation

## 2022-11-13 DIAGNOSIS — R011 Cardiac murmur, unspecified: Secondary | ICD-10-CM | POA: Insufficient documentation

## 2022-11-13 DIAGNOSIS — M1990 Unspecified osteoarthritis, unspecified site: Secondary | ICD-10-CM | POA: Insufficient documentation

## 2022-11-15 ENCOUNTER — Ambulatory Visit: Payer: Medicare Other | Attending: Cardiology | Admitting: Cardiology

## 2022-12-28 ENCOUNTER — Emergency Department (HOSPITAL_COMMUNITY): Payer: Medicare Other

## 2022-12-28 ENCOUNTER — Other Ambulatory Visit: Payer: Self-pay

## 2022-12-28 ENCOUNTER — Inpatient Hospital Stay (HOSPITAL_COMMUNITY)
Admission: EM | Admit: 2022-12-28 | Discharge: 2022-12-31 | DRG: 189 | Disposition: A | Discharge status: Skilled Nursing Facility | Payer: Medicare Other | Source: Skilled Nursing Facility | Attending: Internal Medicine | Admitting: Internal Medicine

## 2022-12-28 DIAGNOSIS — E039 Hypothyroidism, unspecified: Secondary | ICD-10-CM | POA: Diagnosis present

## 2022-12-28 DIAGNOSIS — I152 Hypertension secondary to endocrine disorders: Secondary | ICD-10-CM | POA: Diagnosis present

## 2022-12-28 DIAGNOSIS — I5042 Chronic combined systolic (congestive) and diastolic (congestive) heart failure: Secondary | ICD-10-CM | POA: Diagnosis present

## 2022-12-28 DIAGNOSIS — E119 Type 2 diabetes mellitus without complications: Secondary | ICD-10-CM

## 2022-12-28 DIAGNOSIS — Z8679 Personal history of other diseases of the circulatory system: Secondary | ICD-10-CM

## 2022-12-28 DIAGNOSIS — I509 Heart failure, unspecified: Secondary | ICD-10-CM

## 2022-12-28 DIAGNOSIS — Z7401 Bed confinement status: Secondary | ICD-10-CM

## 2022-12-28 DIAGNOSIS — Z9981 Dependence on supplemental oxygen: Secondary | ICD-10-CM

## 2022-12-28 DIAGNOSIS — Z794 Long term (current) use of insulin: Secondary | ICD-10-CM

## 2022-12-28 DIAGNOSIS — E1142 Type 2 diabetes mellitus with diabetic polyneuropathy: Secondary | ICD-10-CM

## 2022-12-28 DIAGNOSIS — E782 Mixed hyperlipidemia: Secondary | ICD-10-CM | POA: Diagnosis present

## 2022-12-28 DIAGNOSIS — Z87891 Personal history of nicotine dependence: Secondary | ICD-10-CM

## 2022-12-28 DIAGNOSIS — I080 Rheumatic disorders of both mitral and aortic valves: Secondary | ICD-10-CM | POA: Diagnosis present

## 2022-12-28 DIAGNOSIS — Z90710 Acquired absence of both cervix and uterus: Secondary | ICD-10-CM

## 2022-12-28 DIAGNOSIS — J9621 Acute and chronic respiratory failure with hypoxia: Principal | ICD-10-CM | POA: Diagnosis present

## 2022-12-28 DIAGNOSIS — I4892 Unspecified atrial flutter: Secondary | ICD-10-CM | POA: Diagnosis present

## 2022-12-28 DIAGNOSIS — R0602 Shortness of breath: Principal | ICD-10-CM

## 2022-12-28 DIAGNOSIS — Z6841 Body Mass Index (BMI) 40.0 and over, adult: Secondary | ICD-10-CM

## 2022-12-28 DIAGNOSIS — I5032 Chronic diastolic (congestive) heart failure: Secondary | ICD-10-CM | POA: Diagnosis present

## 2022-12-28 DIAGNOSIS — Z79890 Hormone replacement therapy (postmenopausal): Secondary | ICD-10-CM

## 2022-12-28 DIAGNOSIS — Z79899 Other long term (current) drug therapy: Secondary | ICD-10-CM

## 2022-12-28 DIAGNOSIS — Z8541 Personal history of malignant neoplasm of cervix uteri: Secondary | ICD-10-CM

## 2022-12-28 DIAGNOSIS — G8929 Other chronic pain: Secondary | ICD-10-CM | POA: Diagnosis present

## 2022-12-28 DIAGNOSIS — J4489 Other specified chronic obstructive pulmonary disease: Secondary | ICD-10-CM | POA: Diagnosis present

## 2022-12-28 DIAGNOSIS — E1169 Type 2 diabetes mellitus with other specified complication: Secondary | ICD-10-CM | POA: Diagnosis present

## 2022-12-28 DIAGNOSIS — E1141 Type 2 diabetes mellitus with diabetic mononeuropathy: Secondary | ICD-10-CM | POA: Diagnosis present

## 2022-12-28 DIAGNOSIS — R0902 Hypoxemia: Secondary | ICD-10-CM

## 2022-12-28 DIAGNOSIS — I480 Paroxysmal atrial fibrillation: Secondary | ICD-10-CM | POA: Diagnosis present

## 2022-12-28 DIAGNOSIS — J9622 Acute and chronic respiratory failure with hypercapnia: Secondary | ICD-10-CM | POA: Diagnosis present

## 2022-12-28 DIAGNOSIS — Z7901 Long term (current) use of anticoagulants: Secondary | ICD-10-CM

## 2022-12-28 DIAGNOSIS — Z91199 Patient's noncompliance with other medical treatment and regimen due to unspecified reason: Secondary | ICD-10-CM

## 2022-12-28 DIAGNOSIS — I2510 Atherosclerotic heart disease of native coronary artery without angina pectoris: Secondary | ICD-10-CM | POA: Diagnosis present

## 2022-12-28 DIAGNOSIS — J9691 Respiratory failure, unspecified with hypoxia: Secondary | ICD-10-CM | POA: Diagnosis present

## 2022-12-28 DIAGNOSIS — F419 Anxiety disorder, unspecified: Secondary | ICD-10-CM | POA: Diagnosis present

## 2022-12-28 DIAGNOSIS — Z7984 Long term (current) use of oral hypoglycemic drugs: Secondary | ICD-10-CM

## 2022-12-28 DIAGNOSIS — G2581 Restless legs syndrome: Secondary | ICD-10-CM | POA: Diagnosis present

## 2022-12-28 DIAGNOSIS — Z8249 Family history of ischemic heart disease and other diseases of the circulatory system: Secondary | ICD-10-CM

## 2022-12-28 DIAGNOSIS — B1920 Unspecified viral hepatitis C without hepatic coma: Secondary | ICD-10-CM | POA: Diagnosis present

## 2022-12-28 DIAGNOSIS — Z1152 Encounter for screening for COVID-19: Secondary | ICD-10-CM

## 2022-12-28 DIAGNOSIS — I5033 Acute on chronic diastolic (congestive) heart failure: Secondary | ICD-10-CM | POA: Diagnosis present

## 2022-12-28 DIAGNOSIS — Z7951 Long term (current) use of inhaled steroids: Secondary | ICD-10-CM

## 2022-12-28 DIAGNOSIS — E662 Morbid (severe) obesity with alveolar hypoventilation: Secondary | ICD-10-CM | POA: Diagnosis present

## 2022-12-28 NOTE — ED Provider Notes (Signed)
Emergency Department Provider Note  I have reviewed the triage vital signs and the nursing notes.  HISTORY  Chief Complaint Shortness of Breath and Chest Pain   HPI Chelsea [name] is a 57 y.o. female with  multiple past medical issues including thyroid, asthma, chf, COPD, amongst multiple other issues here with dyspnea. In a facility and become significantly sob. Reportedly O2 was in the 70's on her baseline 5L. Put on nonrebreather, albuterol given and brought here for further eval.   PMH Past Medical History:  Diagnosis Date   Acquired hypothyroidism 11/13/2010   Qualifier: Diagnosis of   By: [name], MD, [name]      Acute congestive heart failure ([place]) 03/18/2021   Acute idiopathic gout of left hand 02/16/2019   Acute on chronic diastolic CHF (congestive heart failure) ([place]) 09/25/2017   Acute on chronic diastolic CHF (congestive heart failure), NYHA class 3 ([place]) 09/25/2017   Acute on chronic hypoxic respiratory failure ([place]) 07/04/2022   Acute on chronic respiratory failure with hypoxia ([place]) 09/17/2022   Acute on chronic respiratory failure with hypoxia and hypercapnia ([place]) 04/07/2022   Acute respiratory failure with hypoxia and hypercarbia ([place]) 05/27/2016   Arthritis    "qwhere" (12/05/2017)   Asthma    Atrial flutter ([place]) 03/09/2021   Bleeding of the respiratory tract 04/07/2022   Bronchitis 05/30/2011   Cervical cancer ([place]) 2006   CHF (congestive heart failure) (HCC)    CHF exacerbation ([place]) 07/30/2022   Chronic diastolic heart failure ([place]) 11/14/2010      11/2010 322 lbs      Chronic heart failure with preserved ejection fraction ([place]) 02/23/2018   Chronic lower back pain    Chronic respiratory failure with hypoxia ([place]) 07/02/2018   ABG 11/2017 7.36/69   Consistent with obesity hypoventilation syndrome   Complication of anesthesia    "I have a hard time waking up from under it" (12/05/2017)   COPD (chronic obstructive pulmonary disease)  with chronic bronchitis 07/18/2022   COPD with acute exacerbation ([place]) 03/04/2015   Coronary artery disease    Diastolic congestive heart failure ([place])    Echo 02/21/11: EF 55-60%, moderate LVH, grade 2 diastolic dysfunction, mild to moderate aortic stenosis with a mean gradient 23 mm of mercury   DJD (degenerative joint disease) 07/03/2021   DM2 (diabetes mellitus, type 2) ([place]) 11/10/2010   Dyspnea 12/05/2017   Dyspnea on exertion 02/09/2021   Edema 03/22/2011   Essential (primary) hypertension 11/10/2010   Formatting of this note might be different from the original.  Overview:   Qualifier: Diagnosis of   By: [name]      Last Assessment & Plan:   Watch blood pressure off of minoxidil and add medications as needed.   Essential hypertension 11/10/2010   Qualifier: Diagnosis of   By: [name]       Gastro-esophageal reflux disease without esophagitis 11/10/2010   GERD 11/10/2010   Qualifier: Diagnosis of   By: [name]       Hair loss 09/17/2019   Heart failure ([place]) 04/20/2021   Heart murmur    Hepatitis C    History of cervical cancer 03/22/2011   History of gout X 1   History of tobacco use 04/20/2021   Hx of atrial flutter 03/15/2021   Hyperglycemia due to type 2 diabetes mellitus ([place]) 04/20/2021   Hyperlipidemia    Hypertension    Hypertension associated with diabetes ([place]) 08/25/2018   Irritable bowel syndrome with constipation  04/20/2021   Irritable bowel syndrome with diarrhea 06/18/2019   Limitation of activity due to disability 09/18/2021   Lymphedema of both lower extremities [id number]   Metabolic alkalosis [id number]   Microalbuminuria 04/20/2021   Migraine    "monthly" (12/05/2017)   Mild tricuspid regurgitation 04/02/2021   Mitral valve sclerosis 04/02/2021   Mixed hyperlipidemia 04/18/2020   Moderate aortic stenosis 04/20/2021   Mononeuropathy of lower extremity 04/09/2011   Formatting of this note might be different from the  original.  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  Severe, corrected by CPAP 18 with C-flex of 3  2/13 bipap 20/14 sm ff mask h/h biflex+3cm  Formatting of this note might be different from the original.  Sleep study 06/2020: IMPRESSIONS  - Moderate obstructive sleep apnea occurred during this study (AHI = 17.7/h), mostly REM related  - No significant central sleep apnea occurred during   On home oxygen therapy    "2L all the time" (12/05/2017)   On supplemental oxygen by nasal cannula 04/20/2021   Onychomycosis 06/18/2019   OSA treated with BiPAP    "have CPAP at home too; wearing BiPAP right now" (12/05/2017)   Osteoarthritis 11/28/2010   Qualifier: Diagnosis of   By: Jorene Minors, Scott       Paroxysmal atrial fibrillation ([place]) 09/04/2021   Physical debility 05/10/2018   Formatting of this note might be different from the original.  Due to morbid obesity   Physical deconditioning 04/24/2021   Pneumonia    "several times" (12/05/2017)   Pneumonia due to gram-positive bacteria 08/24/2017   Pressure injury of both heels, unstageable ([place]) 07/03/2021   Pulmonary edema, acute ([place]) 02/21/2022   Renal insufficiency 04/20/2021   Restless leg syndrome 03/22/2021   Restrictive lung disease secondary to obesity 01/28/2022   S/P hysterectomy 02/19/2021   Tinea pedis of both feet 11/12/2018   Upper GI bleed  07/18/2022   Venous insufficiency of both lower extremities 07/02/2021   Weakness 03/20/2021   Weight loss 03/15/2021    Home Medications Prior to Admission medications   Medication Sig Start Date End Date Taking? Authorizing Provider  albuterol (PROVENTIL) (2.5 MG/3ML) 0.083% nebulizer solution Take 3 mLs (2.5 mg total) by nebulization every 6 (six) hours as needed for wheezing or shortness of breath (I50.32). 07/04/16   [name], MD  ALPRAZolam Duanne Moron) 0.5 MG tablet Take 1 tablet (0.5 mg total) by mouth every 8 (eight) hours as needed for anxiety. 10/02/22   Elgergawy, Silver Huguenin, MD  ammonium lactate (LAC-HYDRIN) 12 % lotion Apply 1 Application topically every evening. Apply to  legs and feet every evening shift    [provider]  budesonide (PULMICORT) 0.5 MG/2ML nebulizer solution Inhale 0.5 mg into the lungs every 12 (twelve) hours.    [provider]  Cholecalciferol 125 MCG (5000 UT) TABS Take 1 tablet (5,000 Units total)  by mouth daily. 07/10/22   [name], [name], MD  cyanocobalamin (VITAMIN B12) 1000 MCG tablet Take 1 tablet (1,000 mcg total) by mouth daily. 07/10/22   [name], [name], MD  ELIQUIS 5 MG TABS tablet Take 5 mg by mouth 2 (two) times daily. 04/12/21   [provider]  empagliflozin (JARDIANCE) 10 MG TABS tablet Take 1 tablet (10 mg total) by mouth daily. 10/03/22   Elgergawy, Silver Huguenin, MD  empagliflozin (JARDIANCE) 10 MG TABS tablet Take 10 mg by mouth daily.    [provider]  ferrous sulfate 324 MG TBEC Take 324 mg by mouth daily.    [provider]  folic acid (FOLVITE) 1 MG tablet Take 1 tablet (1 mg total) by mouth daily. 07/10/22   [name], [name], MD  gabapentin (NEURONTIN) 400 MG capsule Take 400 mg by mouth 3 (three) times daily.    [provider]  HYDROcodone-acetaminophen (NORCO/VICODIN) 5-325 MG tablet Take 1 tablet by mouth every 6 (six) hours as needed for severe pain (for back and shoulder  pain). 10/02/22   Elgergawy, Silver Huguenin, MD  insulin aspart (NOVOLOG) 100 UNIT/ML injection insulin aspart (novoLOG) injection 0-24 Units 0-24 Units, Subcutaneous, 3 times daily with meals, CBG < 70: Implement Hypoglycemia measures, call MD CBG < 120 (dose in units): 0 CBG 120 - 160 (dose in units): 2 CBG 161 - 200 (dose in units): 4 CBG 201 - 250 (dose in units): 8 CBG 251 - 300 (dose in units): 12 CBG 301 - 350 (dose in units): 16 CBG 351 - 450 (dose in units): 20 CBG > 450: 24 units and obtain STAT glucose and notify MD 07/10/22   [name], MD  levothyroxine (SYNTHROID) 150 MCG tablet Take 1 tablet (150 mcg total) by mouth daily at 6 (six) AM. 07/10/22   [name], [name], MD  Multiple Vitamin (MULTIVITAMIN) tablet Take 1 tablet by mouth daily.    [provider]  NON FORMULARY Apply BIPAP every night at bedtime.    [provider]  omeprazole (PRILOSEC) 20 MG capsule Take 20 mg by mouth daily. 10/02/22   [provider]  OXYGEN Inhale 4 L/min into the lungs 2 (two) times daily.    [provider]  potassium chloride SA (KLOR-CON M) 20 MEQ tablet Take 1 tablet (20 mEq total) by mouth daily. 07/10/22   [name], [name], MD  spironolactone (ALDACTONE) 25 MG tablet Take 1 tablet (25 mg total) by mouth daily. 10/17/22   Lyda Jester M, PA-C  torsemide (DEMADEX) 20 MG tablet Take 2 tablets (40 mg total) by mouth 2 (two) times daily. 10/02/22   Elgergawy, Silver Huguenin, MD    Social History Social History   Tobacco Use   Smoking status: Never   Smokeless tobacco: Never  Vaping Use   Vaping Use: Never used  Substance Use Topics   Alcohol use: Not Currently   Drug use: No    Review of Systems: Documented in HPI ____________________________________________  PHYSICAL EXAM: VITAL SIGNS: ED Triage Vitals  Enc Vitals Group     BP 12/28/22 2349 (!) 119/107     Pulse Rate 12/28/22 2349 96     Resp 12/28/22 2349 16     Temp 12/28/22 2349 98.5  F (36.9 C)     Temp Source 12/28/22 2349 Oral     SpO2 12/28/22 2349 95 %     Weight 12/28/22 2348 (!) 309 lb (140.2 kg)     Height  12/28/22 2348 5\' 5"  (1.651 m)     Head Circumference --      Peak Flow --      Pain Score 12/28/22 2340 5     Pain Loc --      Pain Edu? --      Excl. in [place]? --    Physical Exam Vitals and nursing note reviewed.  Constitutional:      Appearance: She is well-developed.  HENT:     Head: Normocephalic and atraumatic.  Cardiovascular:     Rate and Rhythm: Normal rate and regular rhythm.     Heart sounds: Murmur heard.  Pulmonary:     Effort: Tachypnea present. No respiratory distress.     Breath sounds: No stridor. Decreased breath sounds present.  Chest:     Chest wall: No mass.  Abdominal:     General: There is no distension.  Musculoskeletal:     Cervical back: Normal range of motion.     Right lower leg: Tenderness (R knee) present. Edema present.     Left lower leg: Edema present.  Skin:    Findings: Erythema (on BLE around lower legs) present.  Neurological:     General: No focal deficit present.     Mental Status: She is alert.       ____________________________________________   LABS (all labs ordered are listed, but only abnormal results are displayed)  Labs Reviewed  RESP PANEL BY RT-PCR (RSV, FLU A&B, COVID)  [id number]  COMPREHENSIVE METABOLIC PANEL  CBC  BRAIN NATRIURETIC PEPTIDE  I-STAT BETA HCG BLOOD, ED ([facility], [facility], [facility] ONLY)  I-STAT VENOUS BLOOD GAS, ED  TROPONIN I (HIGH SENSITIVITY)   ____________________________________________  EKG   EKG Interpretation  Date/Time:  Friday December 28 2022 23:51:52 EDT Ventricular Rate:  94 PR Interval:  142 QRS Duration: 100 QT Interval:  366 QTC Calculation: 458 R Axis:   72 Text Interpretation: Sinus rhythm Repol abnrm suggests ischemia, anterolateral mostly similr to previous with slight worsening of diffuse ST depressions Confirmed by Merrily Pew [phone]) on 12/29/2022 2:09:18  AM        ____________________________________________  RADIOLOGY  No results found. ____________________________________________  PROCEDURES  Procedure(s) performed:   .Critical Care  Performed by: Merrily Pew, MD Authorized by: Merrily Pew, MD   Critical care provider statement:    Critical care time (minutes):  30   Critical care was time spent personally by me on the following activities:  Development of treatment plan with patient or surrogate, discussions with consultants, evaluation of patient's response to treatment, examination of patient, ordering and review of laboratory studies, ordering and review of radiographic studies, ordering and performing treatments and interventions, pulse oximetry, re-evaluation of patient's condition and review of old charts  ____________________________________________  INITIAL IMPRESSION / ASSESSMENT AND PLAN   This patient presents to the ED for concern of respiratory distress, this involves an extensive number of treatment options, and is a complaint that carries with it a high risk of complications and morbidity.  The differential diagnosis includes copd exacerbation, chf, PE, pneumonia  Additional history obtained:  Additional history obtained from nursing Previous records obtained and reviewed in Epic  Co morbidities that complicate the patient evaluation  Obesity Decreased mobility  Social Determinants of Health:  In a facility  ED Course  Images ordered viewed and obtained by myself. Agree with Radiology interpretation. Details in ED course.  Labs ordered reviewed by myself as detailed in ED course.  Consultations obtained/considered detailed in ED  course.        Cardiac Monitoring:  The patient was maintained on a cardiac monitor.  I personally viewed and interpreted the cardiac monitored which showed an underlying rhythm of: sinus tachycardia  CRITICAL INTERVENTIONS:  Albuterol Lasix Increased  O2  Reevaluation:  After the interventions noted above, I reevaluated the patient and found that they have :improved  FINAL IMPRESSION AND PLAN Final diagnoses:  None     Medical screening exam was performed and I feel the patient has had appropriate emergency department evaluation and work-up for their chief complaint and is stable for ADMISSION to the hospitalist time.  I discussed with Dr. Alcario Drought with the hospitalist service and discussed labs, imaging and other work-up in the emergency room.  They agree to admission for further management and work-up of said condition. ____________________________________________   NEW OUTPATIENT MEDICATIONS STARTED DURING THIS VISIT:  New Prescriptions   No medications on file    Note:  This note was prepared with assistance of Dragon voice recognition software. Occasional wrong-word or sound-a-like substitutions may have occurred due to the inherent limitations of voice recognition software.    Asmar Brozek, Corene Cornea, MD 12/30/22 [phone]

## 2022-12-28 NOTE — ED Triage Notes (Signed)
Pt arrives to ed  C/O sob/ substernal CP x 1 day. Pt on 5L at all times. Pt was given 3 neb treatments per facility with little improvement. Pt reported to have O2 of 70's and was placed on non-rebreather

## 2022-12-29 ENCOUNTER — Encounter (HOSPITAL_COMMUNITY): Payer: Self-pay | Admitting: Internal Medicine

## 2022-12-29 DIAGNOSIS — E1142 Type 2 diabetes mellitus with diabetic polyneuropathy: Secondary | ICD-10-CM

## 2022-12-29 DIAGNOSIS — I5032 Chronic diastolic (congestive) heart failure: Secondary | ICD-10-CM | POA: Diagnosis present

## 2022-12-29 DIAGNOSIS — J9622 Acute and chronic respiratory failure with hypercapnia: Secondary | ICD-10-CM | POA: Diagnosis present

## 2022-12-29 DIAGNOSIS — Z87891 Personal history of nicotine dependence: Secondary | ICD-10-CM | POA: Diagnosis not present

## 2022-12-29 DIAGNOSIS — Z1152 Encounter for screening for COVID-19: Secondary | ICD-10-CM | POA: Diagnosis not present

## 2022-12-29 DIAGNOSIS — E039 Hypothyroidism, unspecified: Secondary | ICD-10-CM | POA: Diagnosis present

## 2022-12-29 DIAGNOSIS — J9691 Respiratory failure, unspecified with hypoxia: Secondary | ICD-10-CM | POA: Diagnosis present

## 2022-12-29 DIAGNOSIS — Z794 Long term (current) use of insulin: Secondary | ICD-10-CM

## 2022-12-29 DIAGNOSIS — E782 Mixed hyperlipidemia: Secondary | ICD-10-CM | POA: Diagnosis present

## 2022-12-29 DIAGNOSIS — Z8541 Personal history of malignant neoplasm of cervix uteri: Secondary | ICD-10-CM | POA: Diagnosis not present

## 2022-12-29 DIAGNOSIS — I5033 Acute on chronic diastolic (congestive) heart failure: Secondary | ICD-10-CM | POA: Diagnosis not present

## 2022-12-29 DIAGNOSIS — Z79890 Hormone replacement therapy (postmenopausal): Secondary | ICD-10-CM | POA: Diagnosis not present

## 2022-12-29 DIAGNOSIS — J4489 Other specified chronic obstructive pulmonary disease: Secondary | ICD-10-CM | POA: Diagnosis present

## 2022-12-29 DIAGNOSIS — Z7401 Bed confinement status: Secondary | ICD-10-CM | POA: Diagnosis not present

## 2022-12-29 DIAGNOSIS — E1141 Type 2 diabetes mellitus with diabetic mononeuropathy: Secondary | ICD-10-CM | POA: Diagnosis present

## 2022-12-29 DIAGNOSIS — E1169 Type 2 diabetes mellitus with other specified complication: Secondary | ICD-10-CM | POA: Diagnosis present

## 2022-12-29 DIAGNOSIS — G8929 Other chronic pain: Secondary | ICD-10-CM | POA: Diagnosis present

## 2022-12-29 DIAGNOSIS — I4892 Unspecified atrial flutter: Secondary | ICD-10-CM | POA: Diagnosis present

## 2022-12-29 DIAGNOSIS — E662 Morbid (severe) obesity with alveolar hypoventilation: Secondary | ICD-10-CM

## 2022-12-29 DIAGNOSIS — R0602 Shortness of breath: Secondary | ICD-10-CM | POA: Diagnosis present

## 2022-12-29 DIAGNOSIS — I080 Rheumatic disorders of both mitral and aortic valves: Secondary | ICD-10-CM | POA: Diagnosis present

## 2022-12-29 DIAGNOSIS — G2581 Restless legs syndrome: Secondary | ICD-10-CM | POA: Diagnosis present

## 2022-12-29 DIAGNOSIS — Z8679 Personal history of other diseases of the circulatory system: Secondary | ICD-10-CM

## 2022-12-29 DIAGNOSIS — I152 Hypertension secondary to endocrine disorders: Secondary | ICD-10-CM | POA: Diagnosis present

## 2022-12-29 DIAGNOSIS — Z79899 Other long term (current) drug therapy: Secondary | ICD-10-CM | POA: Diagnosis not present

## 2022-12-29 DIAGNOSIS — J9621 Acute and chronic respiratory failure with hypoxia: Secondary | ICD-10-CM

## 2022-12-29 DIAGNOSIS — B1920 Unspecified viral hepatitis C without hepatic coma: Secondary | ICD-10-CM | POA: Diagnosis present

## 2022-12-29 DIAGNOSIS — Z6841 Body Mass Index (BMI) 40.0 and over, adult: Secondary | ICD-10-CM | POA: Diagnosis not present

## 2022-12-29 DIAGNOSIS — I480 Paroxysmal atrial fibrillation: Secondary | ICD-10-CM | POA: Diagnosis present

## 2022-12-29 LAB — CBC
HCT: 40.3 % (ref 36.0–46.0)
Hemoglobin: 12.4 g/dL (ref 12.0–15.0)
MCH: 26.4 pg (ref 26.0–34.0)
MCHC: 30.8 g/dL (ref 30.0–36.0)
MCV: 85.7 fL (ref 80.0–100.0)
Platelets: 222 10*3/uL (ref 150–400)
RBC: 4.7 MIL/uL (ref 3.87–5.11)
RDW: 15.4 % (ref 11.5–15.5)
WBC: 9.8 10*3/uL (ref 4.0–10.5)
nRBC: 0 % (ref 0.0–0.2)

## 2022-12-29 LAB — COMPREHENSIVE METABOLIC PANEL
ALT: 11 U/L (ref 0–44)
AST: 18 U/L (ref 15–41)
Albumin: 3.1 g/dL — ABNORMAL LOW (ref 3.5–5.0)
Alkaline Phosphatase: 95 U/L (ref 38–126)
Anion gap: 16 — ABNORMAL HIGH (ref 5–15)
BUN: 23 mg/dL — ABNORMAL HIGH (ref 6–20)
CO2: 34 mmol/L — ABNORMAL HIGH (ref 22–32)
Calcium: 8.8 mg/dL — ABNORMAL LOW (ref 8.9–10.3)
Chloride: 87 mmol/L — ABNORMAL LOW (ref 98–111)
Creatinine, Ser: 0.73 mg/dL (ref 0.44–1.00)
GFR, Estimated: >60 mL/min (ref >60)
Glucose, Bld: 185 mg/dL — ABNORMAL HIGH (ref 70–99)
Potassium: 4 mmol/L (ref 3.5–5.1)
Sodium: 137 mmol/L (ref 135–145)
Total Bilirubin: 0.5 mg/dL (ref 0.3–1.2)
Total Protein: 7 g/dL (ref 6.5–8.1)

## 2022-12-29 LAB — RESP PANEL BY RT-PCR (RSV, FLU A&B, COVID)  RVPGX2
Influenza A by PCR: NEGATIVE
Influenza B by PCR: NEGATIVE
Resp Syncytial Virus by PCR: NEGATIVE
SARS Coronavirus 2 by RT PCR: NEGATIVE

## 2022-12-29 LAB — I-STAT VENOUS BLOOD GAS, ED
Acid-Base Excess: 17 mmol/L — ABNORMAL HIGH (ref 0.0–2.0)
Bicarbonate: 42.9 mmol/L — ABNORMAL HIGH (ref 20.0–28.0)
Calcium, Ion: 1 mmol/L — ABNORMAL LOW (ref 1.15–1.40)
HCT: 40 % (ref 36.0–46.0)
Hemoglobin: 13.6 g/dL (ref 12.0–15.0)
O2 Saturation: 99 %
Potassium: 4 mmol/L (ref 3.5–5.1)
Sodium: 137 mmol/L (ref 135–145)
TCO2: 45 mmol/L — ABNORMAL HIGH (ref 22–32)
pCO2, Ven: 55.4 mmHg (ref 44–60)
pH, Ven: 7.497 — ABNORMAL HIGH (ref 7.25–7.43)
pO2, Ven: 138 mmHg — ABNORMAL HIGH (ref 32–45)

## 2022-12-29 LAB — TROPONIN I (HIGH SENSITIVITY)
Troponin I (High Sensitivity): 10 ng/L (ref <18)
Troponin I (High Sensitivity): 9 ng/L (ref <18)

## 2022-12-29 LAB — GLUCOSE, CAPILLARY
Glucose-Capillary: 188 mg/dL — ABNORMAL HIGH (ref 70–99)
Glucose-Capillary: 178 mg/dL — ABNORMAL HIGH (ref 70–99)
Glucose-Capillary: 197 mg/dL — ABNORMAL HIGH (ref 70–99)
Glucose-Capillary: 224 mg/dL — ABNORMAL HIGH (ref 70–99)

## 2022-12-29 LAB — I-STAT BETA HCG BLOOD, ED (MC, WL, AP ONLY): I-stat hCG, quantitative: <5 m[IU]/mL (ref <5)

## 2022-12-29 LAB — BRAIN NATRIURETIC PEPTIDE: B Natriuretic Peptide: 132.9 pg/mL — ABNORMAL HIGH (ref 0.0–100.0)

## 2022-12-29 MED ORDER — FERROUS SULFATE 325 (65 FE) MG PO TABS
325.0000 mg | ORAL_TABLET | Freq: Every day | ORAL | Status: DC
  Administered 2022-12-29 – 2022-12-31 (×3): 325 mg via ORAL
  Filled 2022-12-29 (×3): qty 1

## 2022-12-29 MED ORDER — ONDANSETRON HCL 4 MG/2ML IJ SOLN: 4.0000 mg | Freq: Four times a day (QID) | INTRAMUSCULAR | Status: DC | PRN

## 2022-12-29 MED ORDER — GABAPENTIN 400 MG PO CAPS
400.0000 mg | ORAL_CAPSULE | Freq: Three times a day (TID) | ORAL | Status: DC
  Administered 2022-12-29 – 2022-12-31 (×6): 400 mg via ORAL
  Filled 2022-12-29 (×6): qty 1

## 2022-12-29 MED ORDER — ADULT MULTIVITAMIN W/MINERALS CH
1.0000 | ORAL_TABLET | Freq: Every day | ORAL | Status: DC
  Filled 2022-12-29: qty 1

## 2022-12-29 MED ORDER — SODIUM CHLORIDE 0.9 % IV SOLN: 250.0000 mL | INTRAVENOUS | Status: DC | PRN

## 2022-12-29 MED ORDER — APIXABAN 5 MG PO TABS
5.0000 mg | ORAL_TABLET | Freq: Two times a day (BID) | ORAL | Status: DC
  Administered 2022-12-29 – 2022-12-31 (×5): 5 mg via ORAL
  Filled 2022-12-29 (×5): qty 1

## 2022-12-29 MED ORDER — ORAL CARE MOUTH RINSE: 15.0000 mL | OROMUCOSAL | Status: DC | PRN

## 2022-12-29 MED ORDER — VITAMIN B-12 1000 MCG PO TABS
1000.0000 ug | ORAL_TABLET | Freq: Every day | ORAL | Status: DC
  Administered 2022-12-29 – 2022-12-31 (×3): 1000 ug via ORAL
  Filled 2022-12-29 (×3): qty 1

## 2022-12-29 MED ORDER — AMMONIUM LACTATE 12 % EX LOTN
1.0000 | TOPICAL_LOTION | Freq: Every evening | CUTANEOUS | Status: DC
  Administered 2022-12-29: 1 via TOPICAL
  Filled 2022-12-29: qty 225

## 2022-12-29 MED ORDER — EMPAGLIFLOZIN 10 MG PO TABS
10.0000 mg | ORAL_TABLET | Freq: Every day | ORAL | Status: DC
  Administered 2022-12-29 – 2022-12-31 (×3): 10 mg via ORAL
  Filled 2022-12-29 (×3): qty 1

## 2022-12-29 MED ORDER — FOLIC ACID 1 MG PO TABS
1.0000 mg | ORAL_TABLET | Freq: Every day | ORAL | Status: DC
  Administered 2022-12-29 – 2022-12-31 (×3): 1 mg via ORAL
  Filled 2022-12-29 (×3): qty 1

## 2022-12-29 MED ORDER — SPIRONOLACTONE 25 MG PO TABS
25.0000 mg | ORAL_TABLET | Freq: Every day | ORAL | Status: DC
  Administered 2022-12-29 – 2022-12-31 (×3): 25 mg via ORAL
  Filled 2022-12-29 (×3): qty 1

## 2022-12-29 MED ORDER — POTASSIUM CHLORIDE CRYS ER 20 MEQ PO TBCR
20.0000 meq | EXTENDED_RELEASE_TABLET | Freq: Every day | ORAL | Status: DC
  Administered 2022-12-29 – 2022-12-31 (×3): 20 meq via ORAL
  Filled 2022-12-29 (×3): qty 1

## 2022-12-29 MED ORDER — FUROSEMIDE 10 MG/ML IJ SOLN
80.0000 mg | Freq: Once | INTRAMUSCULAR | Status: AC
  Administered 2022-12-29: 80 mg via INTRAVENOUS
  Filled 2022-12-29: qty 8

## 2022-12-29 MED ORDER — OXYCODONE-ACETAMINOPHEN 5-325 MG PO TABS
1.0000 | ORAL_TABLET | ORAL | Status: DC | PRN
  Administered 2022-12-29 (×2): 2 via ORAL
  Administered 2022-12-29: 1 via ORAL
  Administered 2022-12-30 – 2022-12-31 (×5): 2 via ORAL
  Filled 2022-12-29 (×3): qty 2
  Filled 2022-12-29: qty 1
  Filled 2022-12-29 (×4): qty 2

## 2022-12-29 MED ORDER — SODIUM CHLORIDE 0.9% FLUSH
3.0000 mL | Freq: Two times a day (BID) | INTRAVENOUS | Status: DC
  Administered 2022-12-29 – 2022-12-31 (×5): 3 mL via INTRAVENOUS

## 2022-12-29 MED ORDER — FUROSEMIDE 10 MG/ML IJ SOLN: 40.0000 mg | Freq: Every day | INTRAMUSCULAR | Status: DC

## 2022-12-29 MED ORDER — INSULIN ASPART 100 UNIT/ML IJ SOLN
0.0000 [IU] | Freq: Three times a day (TID) | INTRAMUSCULAR | Status: DC
  Administered 2022-12-29 – 2022-12-30 (×3): 4 [IU] via SUBCUTANEOUS
  Administered 2022-12-30: 3 [IU] via SUBCUTANEOUS
  Administered 2022-12-30: 4 [IU] via SUBCUTANEOUS
  Administered 2022-12-31: 3 [IU] via SUBCUTANEOUS

## 2022-12-29 MED ORDER — ALPRAZOLAM 0.5 MG PO TABS
0.5000 mg | ORAL_TABLET | Freq: Three times a day (TID) | ORAL | Status: DC | PRN
  Administered 2022-12-31: 0.5 mg via ORAL
  Filled 2022-12-29: qty 1

## 2022-12-29 MED ORDER — ADULT MULTIVITAMIN W/MINERALS CH
1.0000 | ORAL_TABLET | Freq: Every day | ORAL | Status: DC
  Administered 2022-12-29 – 2022-12-31 (×3): 1 via ORAL
  Filled 2022-12-29 (×3): qty 1

## 2022-12-29 MED ORDER — LEVOTHYROXINE SODIUM 75 MCG PO TABS
150.0000 ug | ORAL_TABLET | Freq: Every day | ORAL | Status: DC
  Administered 2022-12-29 – 2022-12-31 (×3): 150 ug via ORAL
  Filled 2022-12-29 (×3): qty 2

## 2022-12-29 MED ORDER — ACETAMINOPHEN 325 MG PO TABS
650.0000 mg | ORAL_TABLET | ORAL | Status: DC | PRN
  Filled 2022-12-29 (×2): qty 2

## 2022-12-29 MED ORDER — FUROSEMIDE 10 MG/ML IJ SOLN
40.0000 mg | Freq: Two times a day (BID) | INTRAMUSCULAR | Status: DC
  Administered 2022-12-29: 40 mg via INTRAVENOUS
  Filled 2022-12-29: qty 4

## 2022-12-29 MED ORDER — TORSEMIDE 20 MG PO TABS
40.0000 mg | ORAL_TABLET | Freq: Two times a day (BID) | ORAL | Status: DC
  Administered 2022-12-30 – 2022-12-31 (×3): 40 mg via ORAL
  Filled 2022-12-29 (×3): qty 2

## 2022-12-29 MED ORDER — ALBUTEROL SULFATE (2.5 MG/3ML) 0.083% IN NEBU: 2.5000 mg | INHALATION_SOLUTION | Freq: Four times a day (QID) | RESPIRATORY_TRACT | Status: DC | PRN

## 2022-12-29 MED ORDER — SODIUM CHLORIDE 0.9% FLUSH: 3.0000 mL | INTRAVENOUS | Status: DC | PRN

## 2022-12-29 NOTE — Assessment & Plan Note (Signed)
Cont synthroid 

## 2022-12-29 NOTE — Progress Notes (Signed)
Patient was seen and examined.  Admitted by nighttime hospitalist early morning hours.  History as below.  In brief, 57 year old with morbid obesity, chronic diastolic heart failure, paroxysmal A-fib on Eliquis, type 2 diabetes, moderate aortic stenosis, chronic hypoxemic respiratory failure on 5 L oxygen all the time, BiPAP at night that she is noncompliant with, she has a history of respiratory failure, tracheostomy and weaned off to oxygen and BiPAP recently and long-term nursing home resident brought from the nursing home with shortness of breath and substernal chest pain not relieved with nebulizers.  Reported oxygen saturation 70% and placed on nonrebreather and sent to ER.  Patient reports that her BiPAP machine was broken and did not work for last 10 days. This was communicated with nursing home staff who is stated that patient declined BiPAP and she does decline most of the time.  Shortness of breath and acute on chronic hypoxemia: Multifactorial. Obesity hypoventilation syndrome, sleep apnea and noncompliant to BiPAP therapy. Chronic diastolic heart failure, bedbound status.  Less likely CHF exacerbation, she is at her best dry weight historically.  Diuresed overnight.  Go back on oral torsemide and home regimen. Already improving.  Extensive counseling, documentation about using BiPAP.  She must use BiPAP every night and also in the daytime when she naps.  This was communicated with the patient, with her nursing home facility.  With clinical stability, anticipate going back to nursing home tomorrow.   Time spent: 35 minutes.  Same-day admit.  No charge visit.

## 2022-12-29 NOTE — Social Work (Signed)
CSW was alerted by MD that pt most likely would be ready to return to [facility] tomorrow. MD wanted faiclity to know that t must be on BIPAP every 2 hours day and night.  CSW spoke with Kia at [facility] and let her know of MD's request. Kia confirmed  that pt is non-compliant with th BIPAP. CSW updated MD.

## 2022-12-29 NOTE — Assessment & Plan Note (Addendum)
Needs to be on NIPPV, but non-adherent to this. Needs to be on ozempic or mounjaro if we can get this covered by insurance.  I put in for SW to see what they can do.

## 2022-12-29 NOTE — ED Notes (Signed)
ED TO INPATIENT HANDOFF REPORT  ED Nurse Name and Phone #: Overton Mam [phone]  S Name/Age/Gender Chelsea [name] 57 y.o. female Room/Bed: 008C/008C  Code Status   Code Status: Full Code  Home/SNF/Other Skilled nursing facility Patient oriented to: self, place, time, and situation Is this baseline? Yes   Triage Complete: Triage complete  Chief Complaint Acute on chronic diastolic CHF (congestive heart failure) [I50.33]  Triage Note Pt arrives to ed  C/O sob/ substernal CP x 1 day. Pt on 5L at all times. Pt was given 3 neb treatments per facility with little improvement. Pt reported to have O2 of 70's and was placed on non-rebreather    Allergies Allergies  Allergen Reactions   Iodinated Contrast Media Anaphylaxis   Penicillins Anaphylaxis    Patient tolerates cefepime (08/2017)   Insulin Lispro Other (See Comments)    Adder per MAR from SNF   Minoxidil     Other reaction(s): hives    Level of Care/Admitting Diagnosis ED Disposition     ED Disposition  [place]: [facility] [[id number]]  Level of Care: Telemetry Cardiac [[id number]]  May place patient in observation at [facility] or [place] if equivalent level of care is available:: No  Covid Evaluation: Asymptomatic - no recent exposure (last 10 days) testing not required  Diagnosis: Acute on chronic diastolic CHF (congestive heart failure) [id number]  Admitting Physician: [name] [phone]  Attending Physician: [name]          B Medical/Surgery History Past Medical History:  Diagnosis Date   Acquired hypothyroidism 11/13/2010   Qualifier: Diagnosis of   By: [name], MD, [name]      Acute congestive heart failure ([place]) 03/18/2021   Acute idiopathic gout of left hand 02/16/2019   Acute on chronic diastolic CHF (congestive heart failure) ([place]) 09/25/2017   Acute on chronic diastolic CHF (congestive heart failure),  NYHA class 3 ([place]) 09/25/2017   Acute on chronic hypoxic respiratory failure ([place]) 07/04/2022   Acute on chronic respiratory failure with hypoxia ([place]) 09/17/2022   Acute on chronic respiratory failure with hypoxia and hypercapnia ([place]) 04/07/2022   Acute respiratory failure with hypoxia and hypercarbia ([place]) 05/27/2016   Arthritis    "qwhere" (12/05/2017)   Asthma    Atrial flutter ([place]) 03/09/2021   Bleeding of the respiratory tract 04/07/2022   Bronchitis 05/30/2011   Cervical cancer ([place]) 2006   CHF (congestive heart failure) (HCC)    CHF exacerbation (HCC) 07/30/2022   Chronic diastolic heart failure ([place]) 11/14/2010      11/2010 322 lbs      Chronic heart failure with preserved ejection fraction ([place]) 02/23/2018   Chronic lower back pain    Chronic respiratory failure with hypoxia ([place]) 07/02/2018   ABG 11/2017 7.36/69   Consistent with obesity hypoventilation syndrome   Complication of anesthesia    "I have a hard time waking up from under it" (12/05/2017)   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  Overview:   Qualifier: Diagnosis of   By: [name], [name]:   Watch blood pressure off of minoxidil and add medications as needed.   Essential hypertension 11/10/2010   Qualifier: Diagnosis of   By: [name]       Gastro-esophageal reflux disease without esophagitis 11/10/2010    GERD 11/10/2010   Qualifier: Diagnosis of   By: [name]       Hair loss 09/17/2019   Heart failure ([place]) 04/20/2021   Heart murmur    Hepatitis C    History of cervical cancer 03/22/2011   History of gout X 1   History of tobacco use 04/20/2021   Hx of atrial flutter 03/15/2021   Hyperglycemia due to type 2 diabetes mellitus ([place]) 04/20/2021   Hyperlipidemia    Hypertension    Hypertension associated with diabetes ([place]) 08/25/2018   Irritable bowel syndrome with constipation 04/20/2021   Irritable bowel syndrome with diarrhea 06/18/2019   Limitation of activity due to disability 09/18/2021   Lymphedema of both lower extremities [id number]   Metabolic alkalosis [id number]   Microalbuminuria 04/20/2021   Migraine    "monthly" (12/05/2017)   Mild tricuspid regurgitation 04/02/2021   Mitral valve sclerosis 04/02/2021   Mixed hyperlipidemia 04/18/2020   Moderate aortic stenosis 04/20/2021   Mononeuropathy of lower extremity 04/09/2011   Formatting of this note might be different from the original.  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  Severe, corrected by CPAP 18 with C-flex of 3  2/13 bipap 20/14 sm ff mask h/h biflex+3cm  Formatting of this note might be different from the original.  Sleep study 06/2020: IMPRESSIONS  - Moderate obstructive sleep apnea occurred during this study (AHI  = 17.7/h), mostly REM related  - No significant central sleep apnea occurred during   On home oxygen therapy    "2L all the time" (12/05/2017)   On supplemental oxygen by nasal cannula 04/20/2021   Onychomycosis 06/18/2019   OSA treated with BiPAP    "have CPAP at home too; wearing BiPAP right now" (12/05/2017)   Osteoarthritis 11/28/2010   Qualifier: Diagnosis of   By: Jorene Minors, Scott       Paroxysmal atrial fibrillation ([place]) 09/04/2021   Physical debility 05/10/2018   Formatting of this note might be different from the original.  Due to morbid obesity   Physical deconditioning 04/24/2021   Pneumonia    "several times" (12/05/2017)   Pneumonia due to gram-positive bacteria 08/24/2017   Pressure injury of both heels, unstageable ([place]) 07/03/2021   Pulmonary edema, acute ([place]) 02/21/2022   Renal insufficiency 04/20/2021   Restless leg syndrome 03/22/2021   Restrictive lung disease secondary to obesity 01/28/2022  S/P hysterectomy 02/19/2021   Tinea pedis of both feet 11/12/2018   Upper GI bleed 07/18/2022   Venous insufficiency of both lower extremities 07/02/2021   Weakness 03/20/2021   Weight loss 03/15/2021   Past Surgical History:  Procedure Laterality Date   ABDOMINAL SURGERY  2006 X 2   "TAH incision was infected"   BIOPSY  07/21/2022   Procedure: BIOPSY;  Surgeon: Otis Brace, MD;  Location: [facility];  Service: Gastroenterology;;   CHOLECYSTECTOMY OPEN     ESOPHAGOGASTRODUODENOSCOPY N/A 07/21/2022   Procedure: ESOPHAGOGASTRODUODENOSCOPY (EGD);  Surgeon: Otis Brace, MD;  Location: [facility];  Service: Gastroenterology;  Laterality: N/A;   TONSILLECTOMY     TOTAL ABDOMINAL HYSTERECTOMY  2006   TRACHEOSTOMY REVISION N/A 04/09/2022   Procedure: CONTROL OF BLEEDING,TRACHEOSTOMY SITE;  Surgeon: Melida Quitter, MD;  Location: [facility];  Service: ENT;  Laterality: N/A;     A IV Location/Drains/Wounds Patient Lines/Drains/Airways Status     Active  Line/Drains/Airways     Name Placement date Placement time Site Days   Peripheral IV 12/29/22 20 G 1" Anterior;Proximal;Right Forearm 12/29/22  0100  Forearm  less than 1   External Urinary Catheter 12/29/22  0200  --  less than 1   Pressure Injury 04/12/22 Heel Left Deep Tissue Pressure Injury - Purple or maroon localized area of discolored intact skin or blood-filled blister due to damage of underlying soft tissue from pressure and/or shear. Purple, circular wound to heel 04/12/22  2000  -- 261   Wound / Incision (Open or Dehisced) 04/07/22 Other (Comment) Sacrum Mid long and narrow open wound in the skin fold crack 04/07/22  2000  Sacrum  266            Intake/Output Last 24 hours No intake or output data in the 24 hours ending 12/29/22 0442  Labs/Imaging Results for orders placed or performed during the hospital encounter of 12/28/22 (from the past 48 hour(s))  Resp panel by RT-PCR (RSV, Flu A&B, Covid) Anterior Nasal Swab     Status: None   Collection Time: 12/28/22  1:04 AM   Specimen: Anterior Nasal Swab  Result Value Ref Range   SARS Coronavirus 2 by RT PCR NEGATIVE NEGATIVE   Influenza A by PCR NEGATIVE NEGATIVE   Influenza B by PCR NEGATIVE NEGATIVE    Comment: (NOTE) The Xpert Xpress SARS-CoV-2/FLU/RSV plus assay is intended as an aid in the diagnosis of influenza from Nasopharyngeal swab specimens and should not be used as a sole basis for treatment. Nasal washings and aspirates are unacceptable for Xpert Xpress SARS-CoV-2/FLU/RSV testing.  Fact Sheet for Patients: EntrepreneurPulse.com.au  Fact Sheet for Healthcare Providers: IncredibleEmployment.be  This test is not yet approved or cleared by the [place] FDA and has been authorized for detection and/or diagnosis of SARS-CoV-2 by FDA under an Emergency Use Authorization (EUA). This EUA will remain in effect (meaning this test can be used) for the duration of the COVID-19  declaration under Section 564(b)(1) of the Act, 21 U.S.C. section 360bbb-3(b)(1), unless the authorization is terminated or revoked.     Resp Syncytial Virus by PCR NEGATIVE NEGATIVE    Comment: (NOTE) Fact Sheet for Patients: EntrepreneurPulse.com.au  Fact Sheet for Healthcare Providers: IncredibleEmployment.be  This test is not yet approved or cleared by the [place] FDA and has been authorized for detection and/or diagnosis of SARS-CoV-2 by FDA under an Emergency Use Authorization (EUA). This EUA will remain in effect (meaning this test can be used) for the duration  of the COVID-19 declaration under Section 564(b)(1) of the Act, 21 U.S.C. section 360bbb-3(b)(1), unless the authorization is terminated or revoked.  Performed at [facility], [place] [address], [place], [place] [address]   Comprehensive metabolic panel     Status: Abnormal   Collection Time: 12/28/22 11:49 PM  Result Value Ref Range   Sodium 137 135 - 145 mmol/L   Potassium 4.0 3.5 - 5.1 mmol/L   Chloride 87 (L) 98 - 111 mmol/L   CO2 34 (H) 22 - 32 mmol/L   Glucose, Bld 185 (H) 70 - 99 mg/dL    Comment: Glucose reference range applies only to samples taken after fasting for at least 8 hours.   BUN 23 (H) 6 - 20 mg/dL   Creatinine, Ser 0.73 0.44 - 1.00 mg/dL   Calcium 8.8 (L) 8.9 - 10.3 mg/dL   Total Protein 7.0 6.5 - 8.1 g/dL   Albumin 3.1 (L) 3.5 - 5.0 g/dL   AST 18 15 - 41 U/L   ALT 11 0 - 44 U/L   Alkaline Phosphatase 95 38 - 126 U/L   Total Bilirubin 0.5 0.3 - 1.2 mg/dL   GFR, Estimated >60 >60 mL/min    Comment: (NOTE) Calculated using the CKD-EPI Creatinine Equation (2021)    Anion gap 16 (H) 5 - 15    Comment: Performed at [facility], [place] [address] [place], [place] [address]  CBC     Status: None   Collection Time: 12/28/22 11:49 PM  Result Value Ref Range   WBC 9.8 4.0 - 10.5 K/uL   RBC 4.70 3.87 - 5.11 MIL/uL   Hemoglobin 12.4 12.0 -  15.0 g/dL   HCT 40.3 36.0 - 46.0 %   MCV 85.7 80.0 - 100.0 fL   MCH 26.4 26.0 - 34.0 pg   MCHC 30.8 30.0 - 36.0 g/dL   RDW 15.4 11.5 - 15.5 %   Platelets 222 150 - 400 K/uL   nRBC 0.0 0.0 - 0.2 %    Comment: Performed at [facility], [place] [address], [place], [place] [address]  Troponin I (High Sensitivity)     Status: None   Collection Time: 12/28/22 11:49 PM  Result Value Ref Range   Troponin I (High Sensitivity) 10 <18 ng/L    Comment: (NOTE) Elevated high sensitivity troponin I (hsTnI) values and significant  changes across serial measurements may suggest ACS but many other  chronic and acute conditions are known to elevate hsTnI results.  Refer to the "Links" section for chest pain algorithms and additional  guidance. Performed at [facility], [place] [address], [place], [place] [address]   Brain natriuretic peptide     Status: Abnormal   Collection Time: 12/28/22 11:50 PM  Result Value Ref Range   B Natriuretic Peptide 132.9 (H) 0.0 - 100.0 pg/mL    Comment: Performed at [facility] [address] [place], [place] [address]  I-Stat beta hCG blood, ED     Status: None   Collection Time: 12/29/22  1:45 AM  Result Value Ref Range   I-stat hCG, quantitative <5.0 <5 mIU/mL   Comment 3            Comment:   GEST. AGE      CONC.  (mIU/mL)   <=1 WEEK        5 - 50     2 WEEKS       50 - 500  3 WEEKS       100 - 10,000     4 WEEKS     1,000 - 30,000        FEMALE AND NON-PREGNANT FEMALE:     LESS THAN 5 mIU/mL   I-Stat venous blood gas, ED     Status: Abnormal   Collection Time: 12/29/22  1:45 AM  Result Value Ref Range   pH, Ven 7.497 (H) 7.25 - 7.43   pCO2, Ven 55.4 44 - 60 mmHg   pO2, Ven 138 (H) 32 - 45 mmHg   Bicarbonate 42.9 (H) 20.0 - 28.0 mmol/L   TCO2 45 (H) 22 - 32 mmol/L   O2 Saturation 99 %   Acid-Base Excess 17.0 (H) 0.0 - 2.0 mmol/L   Sodium 137 135 - 145 mmol/L   Potassium 4.0 3.5 - 5.1 mmol/L   Calcium, Ion 1.00 (L) 1.15 - 1.40  mmol/L   HCT 40.0 36.0 - 46.0 %   Hemoglobin 13.6 12.0 - 15.0 g/dL   Sample type VENOUS   Troponin I (High Sensitivity)     Status: None   Collection Time: 12/29/22  1:50 AM  Result Value Ref Range   Troponin I (High Sensitivity) 9 <18 ng/L    Comment: (NOTE) Elevated high sensitivity troponin I (hsTnI) values and significant  changes across serial measurements may suggest ACS but many other  chronic and acute conditions are known to elevate hsTnI results.  Refer to the "Links" section for chest pain algorithms and additional  guidance. Performed at [facility], [place] [address] [place], [place] [address]    DG Chest Port 1 View  Result Date: 12/29/2022 CLINICAL DATA:  Shortness of breath EXAM: PORTABLE CHEST 1 VIEW COMPARISON:  09/22/2022 FINDINGS: Cardiomegaly with vascular congestion. Perihilar and lower lobe opacities are noted, favor edema. Low lung volumes. No effusions. No acute bony abnormality. IMPRESSION: Cardiomegaly with vascular congestion and bilateral perihilar and lower lobe opacities, favor edema. Electronically Signed   By: Rolm Baptise M.D.   On: 12/29/2022 01:07    Pending Labs Unresulted Labs (From admission, onward)     Start     Ordered   12/30/22 [id number]  Basic metabolic panel  Daily,   R     Comments: As Scheduled for 5 days    12/29/22 0431            Vitals/Pain Today's Vitals   12/29/22 0115 12/29/22 0200 12/29/22 0320 12/29/22 0415  BP: 98/72 102/73 103/73 98/75  Pulse: 87 89 85 88  Resp: 14 12 19 19   Temp:   98.5 F (36.9 C)   TempSrc:   Oral   SpO2: 95% 96% 94% 96%  Weight:      Height:      PainSc:        Isolation Precautions No active isolations  Medications Medications  apixaban (ELIQUIS) tablet 5 mg (has no administration in time range)  spironolactone (ALDACTONE) tablet 25 mg (has no administration in time range)  insulin aspart (novoLOG) injection 0-20 Units (has no administration in time range)  empagliflozin  (JARDIANCE) tablet 10 mg (has no administration in time range)  levothyroxine (SYNTHROID) tablet 150 mcg (has no administration in time range)  sodium chloride flush (NS) 0.9 % injection 3 mL (has no administration in time range)  sodium chloride flush (NS) 0.9 % injection 3 mL (has no administration in time range)  0.9 %  sodium chloride infusion (has no administration in time range)  acetaminophen (TYLENOL) tablet 650 mg (has no administration in time range)  ondansetron (ZOFRAN) injection 4 mg (has no administration in time range)  furosemide (LASIX) injection 40 mg (has no administration in time range)  furosemide (LASIX) injection 80 mg (80 mg Intravenous Given 12/29/22 0239)    Mobility non-ambulatory     Focused Assessments Pulmonary Assessment Handoff:  Lung sounds: Bilateral Breath Sounds: Clear O2 Device: High Flow Nasal Cannula O2 Flow Rate (L/min): 10 L/min    R Recommendations: See Admitting Provider Note  Report given to:   Additional Notes:

## 2022-12-29 NOTE — Assessment & Plan Note (Addendum)
HFpEF with grade 2 DD based on prior echo. CHF pathway Tele monitor Lasix 80mg  x1 in ED 40mg  IV BID for the moment Cont home aldactone Daily BMP Cont jardiance

## 2022-12-29 NOTE — Progress Notes (Signed)
RT note. RT at bedside to place patient on bipap at this time per MD request. MD requesting patient to wear bipap x2hr today. Patient initially stated she would wear bipap, patient now refusing due to lunch. Bipap all set up and ready to use once patient is ready. Patient currently on 3L salter sat 91%, no labored breathing noted at this time. RT will continue to monitor, RN aware.   12/29/22 1048  BiPAP/CPAP/SIPAP  $ Non-Invasive Ventilator  Non-Invasive Vent Set Up;Non-Invasive Vent Initial  $ Face Mask Medium Yes  BiPAP/CPAP/SIPAP Pt Type Adult  BiPAP/CPAP/SIPAP SERVO  Mask Type Full face mask  Mask Size Medium  BiPAP/CPAP /SiPAP Vitals  Pulse Rate 81  Resp 20  SpO2 91 %  MEWS Score/Color  MEWS Score 1  MEWS Score Color Nyoka Cowden

## 2022-12-29 NOTE — H&P (Signed)
History and Physical    Patient: Chelsea [name] [id number] DOB: Aug 19, 1966 DOA: 12/28/2022 DOS: the patient was seen and examined on 12/29/2022 PCP: Pcp, No  Patient coming from: SNF  Chief Complaint:  Chief Complaint  Patient presents with   Shortness of Breath   Chest Pain   HPI: Chelsea [name] is a 57 y.o. female with medical history significant of Obesity, HVOS, dCHF (preserved EF, grade 2 DD), PAF on eliquis, DM2, moderate AS.  Pt with onset of severe dyspnea at her SNF.  O2 in 70s on her baseline 5L O2.  Put on NRB, given albuterol, brought in to ED.   Review of Systems: As mentioned in the history of present illness. All other systems reviewed and are negative. Past Medical History:  Diagnosis Date   Acquired hypothyroidism 11/13/2010   Qualifier: Diagnosis of   By: [name], MD, [name]      Acute congestive heart failure ([place]) 03/18/2021   Acute idiopathic gout of left hand 02/16/2019   Acute on chronic diastolic CHF (congestive heart failure) ([place]) 09/25/2017   Acute on chronic diastolic CHF (congestive heart failure), NYHA class 3 ([place]) 09/25/2017   Acute on chronic hypoxic respiratory failure ([place]) 07/04/2022   Acute on chronic respiratory failure with hypoxia ([place]) 09/17/2022   Acute on chronic respiratory failure with hypoxia and hypercapnia ([place]) 04/07/2022   Acute respiratory failure with hypoxia and hypercarbia ([place]) 05/27/2016   Arthritis    "qwhere" (12/05/2017)   Asthma    Atrial flutter ([place]) 03/09/2021   Bleeding of the respiratory tract 04/07/2022   Bronchitis 05/30/2011   Cervical cancer ([place]) 2006   CHF (congestive heart failure) (HCC)    CHF exacerbation ([place]) 07/30/2022   Chronic diastolic heart failure ([place]) 11/14/2010      11/2010 322 lbs      Chronic heart failure with preserved ejection fraction ([place]) 02/23/2018   Chronic lower back pain    Chronic respiratory failure with hypoxia ([place]) 07/02/2018   ABG 11/2017  7.36/69   Consistent with obesity hypoventilation syndrome   Complication of anesthesia    "I have a hard time waking up from under it" (12/05/2017)   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   COPD with acute exacerbation ([place]) 03/04/2015   Coronary artery disease    Diastolic congestive heart failure ([place])    Echo 02/21/11: EF 55-60%, moderate LVH, grade 2 diastolic dysfunction, mild to moderate aortic stenosis with a mean gradient 23 mm of mercury   DJD (degenerative joint disease) 07/03/2021   DM2 (diabetes mellitus, type 2) ([place]) 11/10/2010   Dyspnea 12/05/2017   Dyspnea on exertion 02/09/2021   Edema 03/22/2011   Essential (primary) hypertension 11/10/2010   Formatting of this note might be different from the original.  Overview:   Qualifier: Diagnosis of   By: [name]      Last Assessment & Plan:   Watch blood pressure off of minoxidil and add medications as needed.   Essential hypertension 11/10/2010   Qualifier: Diagnosis of   By: [name]       Gastro-esophageal reflux disease without esophagitis 11/10/2010   GERD 11/10/2010   Qualifier: Diagnosis of   By: [name]       Hair loss 09/17/2019   Heart failure ([place]) 04/20/2021   Heart murmur    Hepatitis C    History of cervical cancer 03/22/2011   History of gout X 1   History of tobacco use  04/20/2021   Hx of atrial flutter 03/15/2021   Hyperglycemia due to type 2 diabetes mellitus ([place]) 04/20/2021   Hyperlipidemia    Hypertension    Hypertension associated with diabetes ([place]) 08/25/2018   Irritable bowel syndrome with constipation 04/20/2021   Irritable bowel syndrome with diarrhea 06/18/2019   Limitation of activity due to disability 09/18/2021   Lymphedema of both lower extremities [id number]   Metabolic alkalosis [id number]   Microalbuminuria 04/20/2021   Migraine    "monthly" (12/05/2017)   Mild tricuspid regurgitation 04/02/2021   Mitral valve  sclerosis 04/02/2021   Mixed hyperlipidemia 04/18/2020   Moderate aortic stenosis 04/20/2021   Mononeuropathy of lower extremity 04/09/2011   Formatting of this note might be different from the original.  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  Severe, corrected by CPAP 18 with C-flex of 3  2/13 bipap 20/14 sm ff mask h/h biflex+3cm  Formatting of this note might be different from the original.  Sleep study 06/2020: IMPRESSIONS  - Moderate obstructive sleep apnea occurred during this study (AHI = 17.7/h), mostly REM related  - No significant central sleep apnea occurred during   On home oxygen therapy    "2L all the time" (12/05/2017)   On supplemental oxygen by nasal cannula 04/20/2021   Onychomycosis 06/18/2019   OSA treated with BiPAP    "have CPAP at home too; wearing BiPAP right now" (12/05/2017)   Osteoarthritis 11/28/2010   Qualifier: Diagnosis of   By: Jorene Minors, Scott       Paroxysmal atrial fibrillation ([place]) 09/04/2021   Physical debility 05/10/2018   Formatting of this note might be different from the original.  Due to morbid obesity   Physical deconditioning 04/24/2021   Pneumonia    "several times" (12/05/2017)   Pneumonia due to gram-positive bacteria 08/24/2017   Pressure injury of both heels, unstageable ([place]) 07/03/2021   Pulmonary edema, acute ([place]) 02/21/2022   Renal insufficiency  04/20/2021   Restless leg syndrome 03/22/2021   Restrictive lung disease secondary to obesity 01/28/2022   S/P hysterectomy 02/19/2021   Tinea pedis of both feet 11/12/2018   Upper GI bleed 07/18/2022   Venous insufficiency of both lower extremities 07/02/2021   Weakness 03/20/2021   Weight loss 03/15/2021   Past Surgical History:  Procedure Laterality Date   ABDOMINAL SURGERY  2006 X 2   "TAH incision was infected"   BIOPSY  07/21/2022   Procedure: BIOPSY;  Surgeon: Otis Brace, MD;  Location: [facility];  Service: Gastroenterology;;   CHOLECYSTECTOMY OPEN     ESOPHAGOGASTRODUODENOSCOPY N/A 07/21/2022   Procedure: ESOPHAGOGASTRODUODENOSCOPY (EGD);  Surgeon: Otis Brace, MD;  Location: [facility];  Service: Gastroenterology;  Laterality: N/A;   TONSILLECTOMY     TOTAL ABDOMINAL HYSTERECTOMY  2006   TRACHEOSTOMY REVISION N/A 04/09/2022   Procedure: CONTROL OF BLEEDING,TRACHEOSTOMY SITE;  Surgeon: Melida Quitter, MD;  Location:  [facility];  Service: ENT;  Laterality: N/A;   Social History:  reports that she has never smoked. She has never used smokeless tobacco. She reports that she does not currently use alcohol. She reports that she does not use drugs.  Allergies  Allergen Reactions   Iodinated Contrast Media Anaphylaxis   Penicillins Anaphylaxis    Patient tolerates cefepime (08/2017)   Insulin Lispro Other (See Comments)    Adder per MAR from SNF   Minoxidil     Other reaction(s): hives    Family History  Problem Relation Age of Onset   Heart disease Father     Prior to Admission medications   Medication Sig Start Date End Date Taking? Authorizing Provider  albuterol (PROVENTIL) (2.5 MG/3ML) 0.083% nebulizer solution Take 3 mLs (2.5 mg total) by nebulization every 6 (six) hours as needed for wheezing or shortness of breath (I50.32). 07/04/16   [name], MD  ALPRAZolam Duanne Moron) 0.5 MG tablet Take 1 tablet (0.5 mg total) by mouth every 8 (eight) hours as  needed for anxiety. 10/02/22   Elgergawy, Silver Huguenin, MD  ammonium lactate (LAC-HYDRIN) 12 % lotion Apply 1 Application topically every evening. Apply to  legs and feet every evening shift    [provider]  budesonide (PULMICORT) 0.5 MG/2ML nebulizer solution Inhale 0.5 mg into the lungs every 12 (twelve) hours.    [provider]  Cholecalciferol 125 MCG (5000 UT) TABS Take 1 tablet (5,000 Units total) by mouth daily. 07/10/22   [name], [name], MD  cyanocobalamin (VITAMIN B12) 1000 MCG tablet Take 1 tablet (1,000 mcg total) by mouth daily. 07/10/22   [name], [name], MD  ELIQUIS 5 MG TABS tablet Take 5 mg by mouth 2 (two) times daily. 04/12/21   [provider]  empagliflozin (JARDIANCE) 10 MG TABS tablet Take 1 tablet (10 mg total) by mouth daily. 10/03/22   Elgergawy, Silver Huguenin, MD  empagliflozin (JARDIANCE) 10 MG TABS tablet Take 10 mg by mouth daily.    [provider]  ferrous sulfate 324 MG TBEC Take 324 mg by mouth daily.    [provider]  folic acid (FOLVITE) 1 MG tablet Take 1 tablet (1 mg total) by mouth daily. 07/10/22   [name], [name], MD  gabapentin (NEURONTIN) 400 MG capsule Take 400 mg by mouth 3 (three) times daily.    [provider]  HYDROcodone-acetaminophen (NORCO/VICODIN) 5-325 MG tablet Take 1 tablet by mouth every 6 (six) hours as needed for severe pain (for back and shoulder pain). 10/02/22   Elgergawy, Silver Huguenin, MD  insulin aspart (NOVOLOG) 100 UNIT/ML injection insulin aspart (novoLOG) injection 0-24 Units 0-24 Units, Subcutaneous, 3 times daily with meals, CBG < 70: Implement Hypoglycemia measures, call MD CBG < 120 (dose in units): 0 CBG 120 - 160 (dose in units): 2 CBG 161 - 200 (dose in units): 4 CBG 201 - 250 (dose in units): 8 CBG 251 - 300 (dose in units): 12 CBG 301 - 350 (dose in units): 16 CBG 351 - 450 (dose in units): 20 CBG > 450: 24 units and obtain STAT glucose and notify MD 07/10/22   [name], MD  levothyroxine (SYNTHROID) 150 MCG tablet Take 1 tablet (150 mcg total) by mouth daily at 6 (six) AM. 07/10/22   [name], [name], MD  Multiple Vitamin (MULTIVITAMIN) tablet Take 1 tablet by mouth daily.    [provider]  NON FORMULARY Apply BIPAP every night at bedtime.  [provider]  omeprazole (PRILOSEC) 20 MG capsule Take 20 mg by mouth daily. 10/02/22   [provider]  OXYGEN Inhale 4 L/min into the lungs 2 (two) times daily.    [provider]  potassium chloride SA (KLOR-CON M) 20 MEQ tablet Take 1 tablet (20 mEq total) by mouth daily. 07/10/22   [name], [name], MD  spironolactone (ALDACTONE) 25 MG tablet Take 1 tablet (25 mg total) by mouth daily. 10/17/22   Lyda Jester M, PA-C  torsemide (DEMADEX) 20 MG tablet Take 2 tablets (40 mg total) by mouth 2 (two) times daily. 10/02/22   Elgergawy, Silver Huguenin, MD    Physical Exam: Vitals:   12/29/22 0115 12/29/22 0200 12/29/22 0320 12/29/22 0415  BP: 98/72 102/73 103/73 98/75  Pulse: 87 89 85 88  Resp: 14 12 19 19   Temp:   98.5 F (36.9 C)   TempSrc:   Oral   SpO2: 95% 96% 94% 96%  Weight:      Height:       Constitutional: NAD, calm, comfortable Respiratory: Diminished bilaterally Cardiovascular: Murmur present Abdomen: no tenderness, no masses palpated. No hepatosplenomegaly. Bowel sounds positive.  Neurologic: CN 2-12 grossly intact. Sensation intact, DTR normal. Strength 5/5 in all 4.  Psychiatric: Normal judgment and insight. Alert and oriented x 3. Normal mood.    Data Reviewed:        Latest Ref Rng & Units 12/29/2022    1:45 AM 12/28/2022   11:49 PM 09/28/2022    1:59 AM  CBC  WBC 4.0 - 10.5 K/uL  9.8  7.8   Hemoglobin 12.0 - 15.0 g/dL 13.6  12.4  10.6   Hematocrit 36.0 - 46.0 % 40.0  40.3  37.8   Platelets 150 - 400 K/uL  222  312       Latest Ref Rng & Units 12/29/2022    1:45 AM 12/28/2022   11:49 PM 10/17/2022   12:02 PM  CMP  Glucose 70 - 99  mg/dL  185  197   BUN 6 - 20 mg/dL  23  20   Creatinine 0.44 - 1.00 mg/dL  0.73  0.63   Sodium 135 - 145 mmol/L 137  137  137   Potassium 3.5 - 5.1 mmol/L 4.0  4.0  3.7   Chloride 98 - 111 mmol/L  87  86   CO2 22 - 32 mmol/L  34  38   Calcium 8.9 - 10.3 mg/dL  8.8  8.7   Total Protein 6.5 - 8.1 g/dL  7.0    Total Bilirubin 0.3 - 1.2 mg/dL  0.5    Alkaline Phos 38 - 126 U/L  95    AST 15 - 41 U/L  18    ALT 0 - 44 U/L  11     BNP (last 3 results) Recent Labs    09/17/22 1834 10/17/22 1202 12/28/22 2350  BNP 143.7* 37.2 132.9*   Trops 10 -> 9  ProBNP (last 3 results) No results for input(s): "PROBNP" in the last 8760 hours.  Covid, flu, and RSV = negative  CXR: IMPRESSION: Cardiomegaly with vascular congestion and bilateral perihilar and lower lobe opacities, favor edema.  Assessment and Plan: * Acute on chronic respiratory failure with hypoxia (HCC) Appears to be due to CHF once again based on BNP and CXR findings. With superimposed HVOS.  Acute on chronic diastolic CHF (congestive heart failure) ([facility]) HFpEF with grade 2 DD based on prior echo. CHF pathway  Tele monitor Lasix 80mg  x1 in ED 40mg  IV BID for the moment Cont home aldactone Daily BMP Cont jardiance  Obesity hypoventilation syndrome ([place]) Needs to be on NIPPV, but non-adherent to this. Needs to be on ozempic or mounjaro if we can get this covered by insurance.  DM2 (diabetes mellitus, type 2) ([place]) Cont jardiance Resistant scale SSI AC Consider ozempic or mounjaro if insurance will cover, think this would provide best treatment for DM2 + morbid obesity in this patient.  Hx of atrial flutter Cont eliquis Not on any rate meds.  Acquired hypothyroidism Cont synthroid      Advance Care Planning:   Code Status: Full Code  Consults: None  Family Communication: No family in room at time of my evaluation  Severity of Illness: The appropriate patient status for this patient is OBSERVATION.  Observation status is judged to be reasonable and necessary in order to provide the required intensity of service to ensure the patient's safety. The patient's presenting symptoms, physical exam findings, and initial radiographic and laboratory data in the context of their medical condition is felt to place them at decreased risk for further clinical deterioration. Furthermore, it is anticipated that the patient will be medically stable for discharge from the hospital within 2 midnights of admission.   Author: [name]., DO 12/29/2022 4:49 AM  For on call review [email or web address].

## 2022-12-29 NOTE — Discharge Instructions (Signed)

## 2022-12-29 NOTE — Assessment & Plan Note (Addendum)
Cont eliquis Not on any rate meds.

## 2022-12-29 NOTE — Assessment & Plan Note (Addendum)
Cont jardiance Resistant scale SSI AC Consider ozempic or mounjaro if insurance will cover, think this would provide best treatment for DM2 + morbid obesity in this patient.

## 2022-12-29 NOTE — Plan of Care (Signed)
  Problem: Coping: Goal: Ability to adjust to condition or change in health will improve Outcome: Progressing   Problem: Fluid Volume: Goal: Ability to maintain a balanced intake and output will improve Outcome: Progressing   Problem: Health Behavior/Discharge Planning: Goal: Ability to identify and utilize available resources and services will improve Outcome: Progressing Goal: Ability to manage health-related needs will improve Outcome: Progressing   Problem: Metabolic: Goal: Ability to maintain appropriate glucose levels will improve Outcome: Progressing   Problem: Nutritional: Goal: Maintenance of adequate nutrition will improve Outcome: Progressing Goal: Progress toward achieving an optimal weight will improve Outcome: Progressing   Problem: Skin Integrity: Goal: Risk for impaired skin integrity will decrease Outcome: Progressing   Problem: Tissue Perfusion: Goal: Adequacy of tissue perfusion will improve Outcome: Progressing   Problem: Education: Goal: Knowledge of General Education information will improve Description: Including pain rating scale, medication(s)/side effects and non-pharmacologic comfort measures Outcome: Progressing   Problem: Health Behavior/Discharge Planning: Goal: Ability to manage health-related needs will improve Outcome: Progressing   Problem: Clinical Measurements: Goal: Ability to maintain clinical measurements within normal limits will improve Outcome: Progressing Goal: Will remain free from infection Outcome: Progressing Goal: Diagnostic test results will improve Outcome: Progressing Goal: Respiratory complications will improve Outcome: Progressing Goal: Cardiovascular complication will be avoided Outcome: Progressing   Problem: Activity: Goal: Risk for activity intolerance will decrease Outcome: Progressing   

## 2022-12-29 NOTE — Assessment & Plan Note (Signed)
Appears to be due to CHF once again based on BNP and CXR findings. With superimposed HVOS.

## 2022-12-30 DIAGNOSIS — J9621 Acute and chronic respiratory failure with hypoxia: Secondary | ICD-10-CM | POA: Diagnosis not present

## 2022-12-30 LAB — BASIC METABOLIC PANEL
Anion gap: 10 (ref 5–15)
BUN: 29 mg/dL — ABNORMAL HIGH (ref 6–20)
CO2: 38 mmol/L — ABNORMAL HIGH (ref 22–32)
Calcium: 9 mg/dL (ref 8.9–10.3)
Chloride: 89 mmol/L — ABNORMAL LOW (ref 98–111)
Creatinine, Ser: 0.75 mg/dL (ref 0.44–1.00)
GFR, Estimated: >60 mL/min (ref >60)
Glucose, Bld: 162 mg/dL — ABNORMAL HIGH (ref 70–99)
Potassium: 3.8 mmol/L (ref 3.5–5.1)
Sodium: 137 mmol/L (ref 135–145)

## 2022-12-30 LAB — GLUCOSE, CAPILLARY
Glucose-Capillary: 139 mg/dL — ABNORMAL HIGH (ref 70–99)
Glucose-Capillary: 176 mg/dL — ABNORMAL HIGH (ref 70–99)
Glucose-Capillary: 176 mg/dL — ABNORMAL HIGH (ref 70–99)
Glucose-Capillary: 159 mg/dL — ABNORMAL HIGH (ref 70–99)

## 2022-12-30 MED ORDER — OXYCODONE-ACETAMINOPHEN 5-325 MG PO TABS: 1.0000 | ORAL_TABLET | Freq: Four times a day (QID) | ORAL | 0 refills | Status: AC | PRN

## 2022-12-30 MED ORDER — ALPRAZOLAM 0.5 MG PO TABS: 0.5000 mg | ORAL_TABLET | Freq: Every evening | ORAL | 0 refills | Status: AC | PRN

## 2022-12-30 NOTE — Plan of Care (Signed)
  Problem: Education: Goal: Ability to describe self-care measures that may prevent or decrease complications (Diabetes Survival Skills Education) will improve Outcome: Progressing   Problem: Coping: Goal: Ability to adjust to condition or change in health will improve Outcome: Progressing   Problem: Fluid Volume: Goal: Ability to maintain a balanced intake and output will improve Outcome: Progressing   Problem: Health Behavior/Discharge Planning: Goal: Ability to identify and utilize available resources and services will improve Outcome: Progressing   Problem: Metabolic: Goal: Ability to maintain appropriate glucose levels will improve Outcome: Progressing   Problem: Nutritional: Goal: Maintenance of adequate nutrition will improve Outcome: Progressing Goal: Progress toward achieving an optimal weight will improve Outcome: Progressing   Problem: Skin Integrity: Goal: Risk for impaired skin integrity will decrease Outcome: Progressing   Problem: Tissue Perfusion: Goal: Adequacy of tissue perfusion will improve Outcome: Progressing   Problem: Education: Goal: Knowledge of General Education information will improve Description: Including pain rating scale, medication(s)/side effects and non-pharmacologic comfort measures Outcome: Progressing   Problem: Health Behavior/Discharge Planning: Goal: Ability to manage health-related needs will improve Outcome: Progressing   Problem: Clinical Measurements: Goal: Ability to maintain clinical measurements within normal limits will improve Outcome: Progressing Goal: Will remain free from infection Outcome: Progressing Goal: Diagnostic test results will improve Outcome: Progressing Goal: Respiratory complications will improve Outcome: Progressing Goal: Cardiovascular complication will be avoided Outcome: Progressing   Problem: Coping: Goal: Level of anxiety will decrease Outcome: Progressing   Problem: Elimination: Goal:  Will not experience complications related to bowel motility Outcome: Progressing Goal: Will not experience complications related to urinary retention Outcome: Progressing   Problem: Pain Managment: Goal: General experience of comfort will improve Outcome: Progressing

## 2022-12-30 NOTE — TOC Progression Note (Addendum)
Transition of Care [facility]) - Progression Note    Patient Details  Name: MCKYNLEIGH RIM MRN: [id number] Date of Birth: Oct 12, 1965  [facility] CM/SW [place], LCSW Phone Number: 12/30/2022, 9:42 AM  Clinical Narrative:     CSW was alerted by MD that pt is medically ready for discharge and has new scripts to be filled. CSW met with pt bedside and confirmed that she was a pt and [facility] and her plan is to return. CSW followed up with Kia at [facility] and she stated that she would have to check to see if scripts could be filled today. CSW awaiting a response back.  11:01PM- Kia informe CSW that the are having troulbel getting in touch with their pharmacy.  TOC team will continue to assist with discharge planning needs.        Expected Discharge Plan and Services                                               Social Determinants of Health (SDOH) Interventions SDOH Screenings   Food Insecurity: No Food Insecurity (12/29/2022)  Housing: Low Risk  (12/29/2022)  Transportation Needs: No Transportation Needs (12/29/2022)  Utilities: Not At Risk (12/29/2022)  Alcohol Screen: Low Risk  (07/05/2022)  Financial Resource Strain: Low Risk  (07/05/2022)  Tobacco Use: Low Risk  (12/29/2022)    Readmission Risk Interventions   Row Labels 07/23/2022    2:19 PM 04/09/2022    4:17 PM  Readmission Risk Prevention Plan   Section Header. No data exists in this row.    Transportation Screening   Complete Complete  PCP or Specialist Appt within 3-5 Days    Complete  HRI or [place]    Complete  Social Work Consult for [place] Planning/Counseling    Complete  Palliative Care Screening    Not Applicable  Medication Review Press photographer)   Complete Referral to Pharmacy  PCP or Specialist appointment within 3-5 days of discharge   Complete   HRI or [place]   Complete   SW Recovery Care/Counseling Consult   Complete   Palliative Care  Screening   Not [place]   Complete

## 2022-12-30 NOTE — Progress Notes (Signed)
PT Cancellation Note  Patient Details Name: Chelsea [name] MRN: [id number] DOB: 1966-09-19   Cancelled Treatment:    Reason Eval/Treat Not Completed: PT screened, no acute PT needs identified, will sign off. Per recent encounter pt has been nonambulatory for at least 18 months and requires use of mechanical lift. No acute PT needed.   Shary Decamp [facility] 12/30/2022, 9:06 AM [place] Office [phone]

## 2022-12-30 NOTE — Plan of Care (Signed)

## 2022-12-30 NOTE — Progress Notes (Signed)
PROGRESS NOTE    Chelsea [name]  [id number] DOB: May 23, 1966 DOA: 12/28/2022 PCP: Pcp, No    Brief Narrative:  57 year old with morbid obesity, chronic diastolic heart failure, paroxysmal A-fib on Eliquis, type 2 diabetes, moderate aortic stenosis, chronic hypoxemic respiratory failure on 5 L oxygen all the time, BiPAP at night that she is noncompliant with, she has a history of respiratory failure, tracheostomy and weaned off to oxygen and BiPAP recently and long-term nursing home resident brought from the nursing home with shortness of breath and substernal chest pain not relieved with nebulizers.  Reported oxygen saturation 70% and placed on nonrebreather and sent to ER.   Patient reports that her BiPAP machine was broken and did not work for last 10 days. This was communicated with nursing home staff who stated that patient declined BiPAP and she does decline most of the time. Treated with IV diuresis and then oral diuresis.  Clinically improved.  Assessment & Plan:   Shortness of breath, acute on chronic hypoxemia: Multifactorial. Obesity hypoventilation syndrome, sleep apnea and noncompliant to BiPAP therapy. Chronic diastolic heart failure, bedbound status.   Less likely CHF exacerbation, she is at her best dry weight historically.  Diuresed adequately.  Back on oral torsemide and home regimen. Already improving.  Extensive counseling, documentation about using BiPAP.  She must use BiPAP every night and also in the daytime when she naps.   Chronic medical issues including Type 2 diabetes, on insulin.  Stable.  Also on Jardiance. Paroxysmal a flutter, not on any rate control.  Sinus rhythm now.  Therapeutic on Eliquis. Hypothyroidism: On Synthroid. Morbid obesity and bedbound status, long-term nursing home resident for now. Chronic pain problem, on oxycodone.  Continue. Anxiety, on chronic Xanax.  Continue.  DVT prophylaxis:  apixaban (ELIQUIS) tablet 5 mg   Code  Status: Full code Family Communication: None at bedside Disposition Plan: Status is: Inpatient Remains inpatient appropriate because: Nursing home unable to admit today     Consultants:  None  Procedures:  None  Antimicrobials:  None   Subjective: Patient seen and examined.  No overnight events.  She wore BiPAP last night for more than 6 hours.  Currently on 5 L of oxygen.  She cannot go to nursing home without narcotics and benzodiazepines that nursing home is not able to fill in today.  Objective: Vitals:   12/30/22 0500 12/30/22 0630 12/30/22 0908 12/30/22 1100  BP: 111/71  112/73 116/80  Pulse: 67  70 79  Resp: 18  18 15   Temp: 99 F (37.2 C)  97.9 F (36.6 C)   TempSrc: Axillary  Oral   SpO2: 97% 96% 92% 92%  Weight:      Height:        Intake/Output Summary (Last 24 hours) at 12/30/2022 1341 Last data filed at 12/30/2022 1223 Gross per 24 hour  Intake 880 ml  Output 3600 ml  Net -2720 ml   Filed Weights   12/28/22 2348 12/29/22 0500 12/30/22 0100  Weight: (!) 140.2 kg (!) 139.2 kg (!) 139.9 kg    Examination:  General exam: Appears calm and comfortable  Chronically sick looking.  Frail and debilitated.  Not in any distress. Respiratory system: No added sounds.  On 5 L oxygen. Cardiovascular system: S1 & S2 heard, RRR. No JVD, murmurs, rubs, gallops or clicks.  Chronic pedal edema.  Dry skin. Gastrointestinal system: Soft.  Nontender.  Bowel sound present. Central nervous system: Alert and oriented. No focal neurological deficits. Grossly weak.  Unable to walk.    Data Reviewed: I have personally reviewed following labs and imaging studies  CBC: Recent Labs  Lab 12/28/22 2349 12/29/22 0145  WBC 9.8  --   HGB 12.4 13.6  HCT 40.3 40.0  MCV 85.7  --   PLT 222  --    Basic Metabolic Panel: Recent Labs  Lab 12/28/22 2349 12/29/22 0145 12/30/22 0035  NA 137 137 137  K 4.0 4.0 3.8  CL 87*  --  89*  CO2 34*  --  38*  GLUCOSE 185*  --  162*   BUN 23*  --  29*  CREATININE 0.73  --  0.75  CALCIUM 8.8*  --  9.0   GFR: Estimated Creatinine Clearance: 109.1 mL/min (by C-G formula based on SCr of 0.75 mg/dL). Liver Function Tests: Recent Labs  Lab 12/28/22 2349  AST 18  ALT 11  ALKPHOS 95  BILITOT 0.5  PROT 7.0  ALBUMIN 3.1*   No results for input(s): "LIPASE", "AMYLASE" in the last 168 hours. No results for input(s): "AMMONIA" in the last 168 hours. Coagulation Profile: No results for input(s): "INR", "PROTIME" in the last 168 hours. Cardiac Enzymes: No results for input(s): "CKTOTAL", "CKMB", "CKMBINDEX", "TROPONINI" in the last 168 hours. BNP (last 3 results) No results for input(s): "PROBNP" in the last 8760 hours. HbA1C: No results for input(s): "HGBA1C" in the last 72 hours. CBG: Recent Labs  Lab 12/29/22 1200 12/29/22 1527 12/29/22 2104 12/30/22 0548 12/30/22 1057  GLUCAP 178* 197* 224* 139* 176*   Lipid Profile: No results for input(s): "CHOL", "HDL", "LDLCALC", "TRIG", "CHOLHDL", "LDLDIRECT" in the last 72 hours. Thyroid Function Tests: No results for input(s): "TSH", "T4TOTAL", "FREET4", "T3FREE", "THYROIDAB" in the last 72 hours. Anemia Panel: No results for input(s): "VITAMINB12", "FOLATE", "FERRITIN", "TIBC", "IRON", "RETICCTPCT" in the last 72 hours. Sepsis Labs: No results for input(s): "PROCALCITON", "LATICACIDVEN" in the last 168 hours.  Recent Results (from the past 240 hour(s))  Resp panel by RT-PCR (RSV, Flu A&B, Covid) Anterior Nasal Swab     Status: None   Collection Time: 12/28/22  1:04 AM   Specimen: Anterior Nasal Swab  Result Value Ref Range Status   SARS Coronavirus 2 by RT PCR NEGATIVE NEGATIVE Final   Influenza A by PCR NEGATIVE NEGATIVE Final   Influenza B by PCR NEGATIVE NEGATIVE Final    Comment: (NOTE) The Xpert Xpress SARS-CoV-2/FLU/RSV plus assay is intended as an aid in the diagnosis of influenza from Nasopharyngeal swab specimens and should not be used as a sole  basis for treatment. Nasal washings and aspirates are unacceptable for Xpert Xpress SARS-CoV-2/FLU/RSV testing.  Fact Sheet for Patients: EntrepreneurPulse.com.au  Fact Sheet for Healthcare Providers: IncredibleEmployment.be  This test is not yet approved or cleared by the [place] FDA and has been authorized for detection and/or diagnosis of SARS-CoV-2 by FDA under an Emergency Use Authorization (EUA). This EUA will remain in effect (meaning this test can be used) for the duration of the COVID-19 declaration under Section 564(b)(1) of the Act, 21 U.S.C. section 360bbb-3(b)(1), unless the authorization is terminated or revoked.     Resp Syncytial Virus by PCR NEGATIVE NEGATIVE Final    Comment: (NOTE) Fact Sheet for Patients: EntrepreneurPulse.com.au  Fact Sheet for Healthcare Providers: IncredibleEmployment.be  This test is not yet approved or cleared by the [place] FDA and has been authorized for detection and/or diagnosis of SARS-CoV-2 by FDA under an Emergency Use Authorization (EUA). This EUA will remain in effect (  meaning this test can be used) for the duration of the COVID-19 declaration under Section 564(b)(1) of the Act, 21 U.S.C. section 360bbb-3(b)(1), unless the authorization is terminated or revoked.  Performed at [facility], [place] [address], [place], [place] [address]          Radiology Studies: DG Chest Port 1 View  Result Date: 12/29/2022 CLINICAL DATA:  Shortness of breath EXAM: PORTABLE CHEST 1 VIEW COMPARISON:  09/22/2022 FINDINGS: Cardiomegaly with vascular congestion. Perihilar and lower lobe opacities are noted, favor edema. Low lung volumes. No effusions. No acute bony abnormality. IMPRESSION: Cardiomegaly with vascular congestion and bilateral perihilar and lower lobe opacities, favor edema. Electronically Signed   By: Rolm Baptise M.D.   On: 12/29/2022 01:07         Scheduled Meds:  ammonium lactate  1 Application Topical QPM   apixaban  5 mg Oral BID   cyanocobalamin  1,000 mcg Oral Daily   empagliflozin  10 mg Oral Daily   ferrous sulfate  325 mg Oral Daily   folic acid  1 mg Oral Daily   gabapentin  400 mg Oral TID   insulin aspart  0-20 Units Subcutaneous TID WC   levothyroxine  150 mcg Oral Q0600   multivitamin with minerals  1 tablet Oral Daily   potassium chloride SA  20 mEq Oral Daily   sodium chloride flush  3 mL Intravenous Q12H   spironolactone  25 mg Oral Daily   torsemide  40 mg Oral BID   Continuous Infusions:  sodium chloride       LOS: 1 day    Time spent: 35 minutes    Barb Merino, MD [facility] Pager [phone]

## 2022-12-30 NOTE — Evaluation (Signed)
Occupational Therapy Evaluation Patient Details Name: Chelsea [name] MRN: [id number] DOB: Apr 06, 1966 Today's Date: 12/30/2022   History of Present Illness 57 y.o. female with medical history significant of Obesity, HVOS, dCHF (preserved EF, grade 2 DD), PAF on eliquis, DM2, moderate AS.  Pt with onset of severe dyspnea at her SNF.   Clinical Impression   Patient with base level OT assessment this date.  Patient resides at a local SNF and requires assist with ADL's except self feeding, and use of a hoyer for OOB.  Patient continues to be able to feed herself after setup and can participate with grooming when prompted.  Patient does not self initiate much, and complains of chronic L upper extremity pain.  Per patient and TOC note, she is preparing to return to her SNF.  Recommend prior level of supports.  No OT needs identified in the acute setting.       Recommendations for follow up therapy are one component of a multi-disciplinary discharge planning process, led by the attending physician.  Recommendations may be updated based on patient status, additional functional criteria and insurance authorization.   Assistance Recommended at Discharge Frequent or constant Supervision/Assistance  Patient can return home with the following Two people to help with walking and/or transfers;A lot of help with bathing/dressing/bathroom;Assist for transportation    Functional Status Assessment  Patient has not had a recent decline in their functional status  Equipment Recommendations  None recommended by OT    Recommendations for Other Services       Precautions / Restrictions Precautions Precautions: Fall Restrictions Weight Bearing Restrictions: No      Mobility Bed Mobility Overal bed mobility: Needs Assistance Bed Mobility: Rolling Rolling: Mod assist              Transfers                   General transfer comment: Hoyer lift at baseline      Balance                                            ADL either performed or assessed with clinical judgement   ADL Overall ADL's : At baseline                                             Vision Baseline Vision/History: 0 No visual deficits Patient Visual Report: No change from baseline       Perception     Praxis      Pertinent Vitals/Pain Pain Assessment Pain Assessment: Faces Faces Pain Scale: Hurts even more Pain Location: L arm Pain Descriptors / Indicators: Grimacing Pain Intervention(s): Monitored during session     Hand Dominance Right   Extremity/Trunk Assessment Upper Extremity Assessment Upper Extremity Assessment: Generalized weakness;LUE deficits/detail LUE: Shoulder pain with ROM LUE Sensation: WNL LUE Coordination: WNL   Lower Extremity Assessment Lower Extremity Assessment: Generalized weakness   Cervical / Trunk Assessment Cervical / Trunk Assessment: Other exceptions Cervical / Trunk Exceptions: body habitus   Communication Communication Communication: No difficulties   Cognition Arousal/Alertness: Awake/alert Behavior During Therapy: WFL for tasks assessed/performed Overall Cognitive Status: Within Functional Limits for tasks assessed  General Comments   Patient removing O2     Exercises     Shoulder Instructions      Home Living Family/patient expects to be discharged to:: Skilled nursing facility                                 Additional Comments: pt is a resident of [name]      Prior Functioning/Environment Prior Level of Function : Needs assist             Mobility Comments: assisted for bed mobility, hoyer lift to chair ADLs Comments: Pt requires assistance with bathing, dressing, hygiene tasks, IADLs        OT Problem List: Decreased strength;Decreased activity tolerance;Pain      OT Treatment/Interventions:       OT Goals(Current goals can be found in the care plan section) Acute Rehab OT Goals Patient Stated Goal: None stated Time For Goal Achievement: 01/04/23 Potential to Achieve Goals: Fair  OT Frequency:      Co-evaluation              AM-PAC OT "6 Clicks" Daily Activity     Outcome Measure Help from another person eating meals?: A Little Help from another person taking care of personal grooming?: A Little Help from another person toileting, which includes using toliet, bedpan, or urinal?: A Lot Help from another person bathing (including washing, rinsing, drying)?: A Lot Help from another person to put on and taking off regular upper body clothing?: A Lot Help from another person to put on and taking off regular lower body clothing?: Total 6 Click Score: 13   End of Session Nurse Communication: Need for lift equipment  Activity Tolerance: Patient limited by pain Patient left: in bed;with call bell/phone within reach  OT Visit Diagnosis: Pain Pain - Right/Left: Left Pain - part of body: Arm                Time: 1040-1053 OT Time Calculation (min): 13 min Charges:  OT General Charges $OT Visit: 1 Visit OT Evaluation $OT Eval Moderate Complexity: 1 Mod  12/30/2022  RP, OTR/L  Acute Rehabilitation Services  Office:  [phone]   [name] 12/30/2022, 10:57 AM

## 2022-12-31 DIAGNOSIS — J9621 Acute and chronic respiratory failure with hypoxia: Secondary | ICD-10-CM | POA: Diagnosis not present

## 2022-12-31 LAB — BASIC METABOLIC PANEL
Anion gap: 14 (ref 5–15)
BUN: 28 mg/dL — ABNORMAL HIGH (ref 6–20)
CO2: 36 mmol/L — ABNORMAL HIGH (ref 22–32)
Calcium: 9 mg/dL (ref 8.9–10.3)
Chloride: 87 mmol/L — ABNORMAL LOW (ref 98–111)
Creatinine, Ser: 0.93 mg/dL (ref 0.44–1.00)
GFR, Estimated: >60 mL/min (ref >60)
Glucose, Bld: 149 mg/dL — ABNORMAL HIGH (ref 70–99)
Potassium: 3.7 mmol/L (ref 3.5–5.1)
Sodium: 137 mmol/L (ref 135–145)

## 2022-12-31 LAB — GLUCOSE, CAPILLARY: Glucose-Capillary: 132 mg/dL — ABNORMAL HIGH (ref 70–99)

## 2022-12-31 MED ORDER — DIPHENHYDRAMINE HCL 25 MG PO CAPS
25.0000 mg | ORAL_CAPSULE | Freq: Four times a day (QID) | ORAL | Status: DC | PRN
  Administered 2022-12-31: 25 mg via ORAL
  Filled 2022-12-31: qty 1

## 2022-12-31 NOTE — Discharge Summary (Signed)
Physician Discharge Summary  Chelsea [name] [id number] DOB: 03-10-1966 DOA: 12/28/2022  PCP: Merryl Hacker, No  Admit date: 12/28/2022 Discharge date: 12/31/2022  Admitted From: Nursing home Disposition: Long-term nursing home  Recommendations for Outpatient Follow-up:  Follow up with PCP in 1-2 weeks Please obtain BMP/CBC in one week    Discharge Condition: Fair CODE STATUS: Full code Diet recommendation: Low-salt and low-carb diet.  Discharge summary: 57 year old with morbid obesity, chronic diastolic heart failure, paroxysmal A-fib on Eliquis, type 2 diabetes, moderate aortic stenosis, chronic hypoxemic respiratory failure on 5 L oxygen all the time, BiPAP at night that she is noncompliant with, she has a history of respiratory failure, tracheostomy and weaned off to oxygen and BiPAP recently and long-term nursing home resident brought from the nursing home with shortness of breath and substernal chest pain not relieved with nebulizers.  Reported oxygen saturation 70% and placed on nonrebreather and sent to ER.   Patient reported that her BiPAP machine was broken and did not work for last 10 days. This was communicated with nursing home staff who stated that patient declined BiPAP and she does decline most of the time. Treated with IV diuresis and then oral diuresis.  Clinically improved.   Assessment & plan of care:   Shortness of breath, acute on chronic hypoxemia: Multifactorial. Obesity hypoventilation syndrome, sleep apnea and noncompliant to BiPAP therapy. Chronic diastolic heart failure, bedbound status.   Less likely CHF exacerbation, she is at her best dry weight historically.  Diuresed adequately.  Back on oral torsemide and home regimen. Already improving.  Extensive counseling, documentation about using BiPAP.  She must use BiPAP every night and also in the daytime when she naps.    Chronic medical issues including Type 2 diabetes, on insulin and Jardiance.  Is stable.   Resume.  Paroxysmal a flutter, not on any rate control.  Sinus rhythm now.  Therapeutic on Eliquis.  Hypothyroidism: On Synthroid.  Morbid obesity and bedbound status, long-term nursing home resident for now.  Chronic pain problem, on oxycodone.  Continue.  Anxiety, on chronic Xanax.  Continue.  Medically stable to discharge back to nursing home today.  Discharge Diagnoses:  Principal Problem:   Acute on chronic respiratory failure with hypoxia Active Problems:   Acute on chronic diastolic CHF (congestive heart failure)   Obesity hypoventilation syndrome   Acquired hypothyroidism   Hx of atrial flutter   DM2 (diabetes mellitus, type 2)   Respiratory failure with hypoxia and hypercapnia    Discharge Instructions  Discharge Instructions     Diet - low sodium heart healthy   Complete by: As directed    Diet Carb Modified   Complete by: As directed    Discharge instructions   Complete by: As directed    Please wear BIPAP at naps and night every day, all the times   Do Incentive spirometry all the time   Increase activity slowly   Complete by: As directed    Increase activity slowly   Complete by: As directed       Allergies as of 12/31/2022       Reactions   Iodinated Contrast Media Anaphylaxis   Penicillins Anaphylaxis   Patient tolerates cefepime (08/2017)   Insulin Lispro Other (See Comments)   Adder per MAR from SNF   Minoxidil Hives        Medication List     STOP taking these medications    cyanocobalamin 1000 MCG tablet Commonly known as: VITAMIN B12  fluconazole 150 MG tablet Commonly known as: DIFLUCAN   HYDROcodone-acetaminophen 5-325 MG tablet Commonly known as: NORCO/VICODIN   insulin aspart 100 UNIT/ML injection Commonly known as: novoLOG   NON FORMULARY       TAKE these medications    albuterol (2.5 MG/3ML) 0.083% nebulizer solution Commonly known as: PROVENTIL Take 3 mLs (2.5 mg total) by nebulization every 6 (six) hours  as needed for wheezing or shortness of breath (I50.32).   ALPRAZolam 0.5 MG tablet Commonly known as: XANAX Take 1 tablet (0.5 mg total) by mouth at bedtime as needed for up to 5 days for anxiety. What changed:  when to take this Another medication with the same name was removed. Continue taking this medication, and follow the directions you see here.   ammonium lactate 12 % lotion Commonly known as: LAC-HYDRIN Apply 1 Application topically every evening. Apply to  legs and feet every evening shift   atorvastatin 20 MG tablet Commonly known as: LIPITOR Take 20 mg by mouth every evening.   budesonide 0.5 MG/2ML nebulizer solution Commonly known as: PULMICORT Inhale 0.5 mg into the lungs every 12 (twelve) hours.   buPROPion 75 MG tablet Commonly known as: WELLBUTRIN Take 75 mg by mouth 2 (two) times daily.   Cholecalciferol 125 MCG (5000 UT) Tabs Take 1 tablet (5,000 Units total) by mouth daily.   Eliquis 5 MG Tabs tablet Generic drug: apixaban Take 5 mg by mouth 2 (two) times daily.   empagliflozin 10 MG Tabs tablet Commonly known as: JARDIANCE Take 1 tablet (10 mg total) by mouth daily. What changed: Another medication with the same name was removed. Continue taking this medication, and follow the directions you see here.   ferrous sulfate 325 (65 FE) MG EC tablet Take 325 mg by mouth daily. What changed: Another medication with the same name was removed. Continue taking this medication, and follow the directions you see here.   Fiasp FlexTouch 100 UNIT/ML FlexTouch Pen Generic drug: insulin aspart Inject 0-24 Units into the skin in the morning, at noon, and at bedtime. BS 70-119 inject 0 units  BS 120-160 inject 2 units BS 161-200 inject 4 units  BS 201-250 inject 8 units  BS 251-300 inject 12 units  BS 301-350 inject 16 units  BS 351-450 inject 20 units  BS 451 and greater inject 24 units   folic acid 1 MG tablet Commonly known as: FOLVITE Take 1 tablet (1 mg  total) by mouth daily.   gabapentin 400 MG capsule Commonly known as: NEURONTIN Take 400 mg by mouth 3 (three) times daily.   levothyroxine 150 MCG tablet Commonly known as: SYNTHROID Take 1 tablet (150 mcg total) by mouth daily at 6 (six) AM. What changed: when to take this   loperamide 2 MG tablet Commonly known as: IMODIUM A-D Take 2 mg by mouth in the morning, at noon, in the evening, and at bedtime. Prn order: Take 2 mg by mouth every 4 hours as needed for diarrhea   multivitamin tablet Take 1 tablet by mouth daily.   NON FORMULARY Apply BIPAP every night at bedtime.   omeprazole 20 MG capsule Commonly known as: PRILOSEC Take 20 mg by mouth daily.   oxyCODONE-acetaminophen 5-325 MG tablet Commonly known as: PERCOCET/ROXICET Take 1 tablet by mouth every 6 (six) hours as needed for up to 5 days for moderate pain or severe pain. What changed:  when to take this reasons to take this   OXYGEN Inhale 5 L/min into the  lungs in the morning and at bedtime. Connect to BIPAP qhs   potassium chloride SA 20 MEQ tablet Commonly known as: KLOR-CON M Take 1 tablet (20 mEq total) by mouth daily.   spironolactone 25 MG tablet Commonly known as: ALDACTONE Take 1 tablet (25 mg total) by mouth daily. What changed: how much to take   torsemide 20 MG tablet Commonly known as: DEMADEX Take 2 tablets (40 mg total) by mouth 2 (two) times daily.   Trulicity [id number] Sopn Generic drug: Dulaglutide Inject 0.75 mg into the skin once a week.        Allergies  Allergen Reactions   Iodinated Contrast Media Anaphylaxis   Penicillins Anaphylaxis    Patient tolerates cefepime (08/2017)   Insulin Lispro Other (See Comments)    Adder per MAR from SNF   Minoxidil Hives    Consultations: None   Procedures/Studies: DG Chest Port 1 View  Result Date: 12/29/2022 CLINICAL DATA:  Shortness of breath EXAM: PORTABLE CHEST 1 VIEW COMPARISON:  09/22/2022 FINDINGS: Cardiomegaly with  vascular congestion. Perihilar and lower lobe opacities are noted, favor edema. Low lung volumes. No effusions. No acute bony abnormality. IMPRESSION: Cardiomegaly with vascular congestion and bilateral perihilar and lower lobe opacities, favor edema. Electronically Signed   By: Rolm Baptise M.D.   On: 12/29/2022 01:07   (Echo, Carotid, EGD, Colonoscopy, ERCP)    Subjective: Patient seen and examined.  No overnight events.  Breathing better.  Mostly on 4 to 5 L of oxygen.  Very compliant to BiPAP in the hospital.   Discharge Exam: Vitals:   12/31/22 0307 12/31/22 0411  BP:  121/73  Pulse: 72 64  Resp: 14 16  Temp:  97.9 F (36.6 C)  SpO2: 94% 99%   Vitals:   12/30/22 2245 12/30/22 2355 12/31/22 0307 12/31/22 0411  BP: 103/72   121/73  Pulse:   72 64  Resp: 14 16 14 16   Temp: 98 F (36.7 C)   97.9 F (36.6 C)  TempSrc: Oral   Oral  SpO2: 93% 93% 94% 99%  Weight:    (!) 139.8 kg  Height:        General: Pt is alert, awake, not in acute distress Able to talk in complete sentences.  Looks comfortable on 5 L oxygen.  Morbidly obese.  Frail looking. Cardiovascular: RRR, S1/S2 +, no rubs, no gallops Respiratory: CTA bilaterally, no wheezing, no rhonchi, no added sounds. Abdominal: Soft, NT, ND, bowel sounds + Extremities: Nonpitting edema, chronic venous stasis changes.  Grossly weak.    The results of significant diagnostics from this hospitalization (including imaging, microbiology, ancillary and laboratory) are listed below for reference.     Microbiology: Recent Results (from the past 240 hour(s))  Resp panel by RT-PCR (RSV, Flu A&B, Covid) Anterior Nasal Swab     Status: None   Collection Time: 12/28/22  1:04 AM   Specimen: Anterior Nasal Swab  Result Value Ref Range Status   SARS Coronavirus 2 by RT PCR NEGATIVE NEGATIVE Final   Influenza A by PCR NEGATIVE NEGATIVE Final   Influenza B by PCR NEGATIVE NEGATIVE Final    Comment: (NOTE) The Xpert Xpress  SARS-CoV-2/FLU/RSV plus assay is intended as an aid in the diagnosis of influenza from Nasopharyngeal swab specimens and should not be used as a sole basis for treatment. Nasal washings and aspirates are unacceptable for Xpert Xpress SARS-CoV-2/FLU/RSV testing.  Fact Sheet for Patients: EntrepreneurPulse.com.au  Fact Sheet for Healthcare Providers: IncredibleEmployment.be  This  test is not yet approved or cleared by the [place] and has been authorized for detection and/or diagnosis of SARS-CoV-2 by FDA under an Emergency Use Authorization (EUA). This EUA will remain in effect (meaning this test can be used) for the duration of the COVID-19 declaration under Section 564(b)(1) of the Act, 21 U.S.C. section 360bbb-3(b)(1), unless the authorization is terminated or revoked.     Resp Syncytial Virus by PCR NEGATIVE NEGATIVE Final    Comment: (NOTE) Fact Sheet for Patients: EntrepreneurPulse.com.au  Fact Sheet for Healthcare Providers: IncredibleEmployment.be  This test is not yet approved or cleared by the [place] FDA and has been authorized for detection and/or diagnosis of SARS-CoV-2 by FDA under an Emergency Use Authorization (EUA). This EUA will remain in effect (meaning this test can be used) for the duration of the COVID-19 declaration under Section 564(b)(1) of the Act, 21 U.S.C. section 360bbb-3(b)(1), unless the authorization is terminated or revoked.  Performed at [facility], [place] [address] [place], [place] [address]      Labs: BNP (last 3 results) Recent Labs    09/17/22 1834 10/17/22 1202 12/28/22 2350  BNP 143.7* 37.2 123456*   Basic Metabolic Panel: Recent Labs  Lab 12/28/22 2349 12/29/22 0145 12/30/22 0035 12/31/22 0028  NA 137 137 137 137  K 4.0 4.0 3.8 3.7  CL 87*  --  89* 87*  CO2 34*  --  38* 36*  GLUCOSE 185*  --  162* 149*  BUN 23*  --  29* 28*   CREATININE 0.73  --  0.75 0.93  CALCIUM 8.8*  --  9.0 9.0   Liver Function Tests: Recent Labs  Lab 12/28/22 2349  AST 18  ALT 11  ALKPHOS 95  BILITOT 0.5  PROT 7.0  ALBUMIN 3.1*   No results for input(s): "LIPASE", "AMYLASE" in the last 168 hours. No results for input(s): "AMMONIA" in the last 168 hours. CBC: Recent Labs  Lab 12/28/22 2349 12/29/22 0145  WBC 9.8  --   HGB 12.4 13.6  HCT 40.3 40.0  MCV 85.7  --   PLT 222  --    Cardiac Enzymes: No results for input(s): "CKTOTAL", "CKMB", "CKMBINDEX", "TROPONINI" in the last 168 hours. BNP: Invalid input(s): "POCBNP" CBG: Recent Labs  Lab 12/30/22 0548 12/30/22 1057 12/30/22 1558 12/30/22 2103 12/31/22 0619  GLUCAP 139* 176* 176* 159* 132*   D-Dimer No results for input(s): "DDIMER" in the last 72 hours. Hgb A1c No results for input(s): "HGBA1C" in the last 72 hours. Lipid Profile No results for input(s): "CHOL", "HDL", "LDLCALC", "TRIG", "CHOLHDL", "LDLDIRECT" in the last 72 hours. Thyroid function studies No results for input(s): "TSH", "T4TOTAL", "T3FREE", "THYROIDAB" in the last 72 hours.  Invalid input(s): "FREET3" Anemia work up No results for input(s): "VITAMINB12", "FOLATE", "FERRITIN", "TIBC", "IRON", "RETICCTPCT" in the last 72 hours. Urinalysis    Component Value Date/Time   COLORURINE STRAW (A) 07/04/2022 2300   APPEARANCEUR CLEAR 07/04/2022 2300   LABSPEC 1.008 07/04/2022 2300   PHURINE 6.0 07/04/2022 2300   GLUCOSEU NEGATIVE 07/04/2022 2300   HGBUR NEGATIVE 07/04/2022 2300   BILIRUBINUR NEGATIVE 07/04/2022 2300   KETONESUR NEGATIVE 07/04/2022 2300   PROTEINUR NEGATIVE 07/04/2022 2300   UROBILINOGEN 0.2 12/08/2010 0545   NITRITE NEGATIVE 07/04/2022 2300   LEUKOCYTESUR NEGATIVE 07/04/2022 2300   Sepsis Labs Recent Labs  Lab 12/28/22 2349  WBC 9.8   Microbiology Recent Results (from the past 240 hour(s))  Resp panel by RT-PCR (RSV,  Flu A&B, Covid) Anterior Nasal Swab     Status:  None   Collection Time: 12/28/22  1:04 AM   Specimen: Anterior Nasal Swab  Result Value Ref Range Status   SARS Coronavirus 2 by RT PCR NEGATIVE NEGATIVE Final   Influenza A by PCR NEGATIVE NEGATIVE Final   Influenza B by PCR NEGATIVE NEGATIVE Final    Comment: (NOTE) The Xpert Xpress SARS-CoV-2/FLU/RSV plus assay is intended as an aid in the diagnosis of influenza from Nasopharyngeal swab specimens and should not be used as a sole basis for treatment. Nasal washings and aspirates are unacceptable for Xpert Xpress SARS-CoV-2/FLU/RSV testing.  Fact Sheet for Patients: EntrepreneurPulse.com.au  Fact Sheet for Healthcare Providers: IncredibleEmployment.be  This test is not yet approved or cleared by the [place] FDA and has been authorized for detection and/or diagnosis of SARS-CoV-2 by FDA under an Emergency Use Authorization (EUA). This EUA will remain in effect (meaning this test can be used) for the duration of the COVID-19 declaration under Section 564(b)(1) of the Act, 21 U.S.C. section 360bbb-3(b)(1), unless the authorization is terminated or revoked.     Resp Syncytial Virus by PCR NEGATIVE NEGATIVE Final    Comment: (NOTE) Fact Sheet for Patients: EntrepreneurPulse.com.au  Fact Sheet for Healthcare Providers: IncredibleEmployment.be  This test is not yet approved or cleared by the [place] FDA and has been authorized for detection and/or diagnosis of SARS-CoV-2 by FDA under an Emergency Use Authorization (EUA). This EUA will remain in effect (meaning this test can be used) for the duration of the COVID-19 declaration under Section 564(b)(1) of the Act, 21 U.S.C. section 360bbb-3(b)(1), unless the authorization is terminated or revoked.  Performed at [facility], [place] [address], [place], [place] [address]      Time coordinating discharge:  35 minutes  SIGNED:   Barb Merino, MD  [facility] 12/31/2022, 8:37 AM

## 2022-12-31 NOTE — Progress Notes (Addendum)
Pt still not ready to be placed on Servo Air BIPAP therapy at this time. Servo Air remains on stand by @ bedside. Pt agrees she will notify the RN when she's ready to be placed on the unit. No distress noted on 5L [place]. RT to follow.

## 2022-12-31 NOTE — Progress Notes (Signed)
Pt not ready to be placed on Servo Air at this time. Servo Air on stand by @ bedside. Pt requesting to be placed on unit later tonight. No distress noted on 5L [place]. RT to follow up.

## 2022-12-31 NOTE — TOC Transition Note (Signed)
Transition of Care [facility]) - CM/SW Discharge Note   Patient Details  Name: Chelsea [name] MRN: [id number] Date of Birth: April 21, 1966  [facility]) CM/SW Contact:  Bjorn Pippin, LCSW Phone Number: 12/31/2022, 10:54 AM   Clinical Narrative:    Patient will DC to: [facility] Anticipated DC date: 12/31/2022 Family notified: Dewayne Transport by: [name]   Per MD patient ready for DC to .[employer], patient, patient's family, and facility notified of DC. Discharge Summary and FL2 sent to facility. RN to call report prior to discharge [phone]. DC packet on chart. Ambulance transport requested for patient.   CSW will sign off for now as social work intervention is no longer needed. Please consult [place] again if new needs arise.    Final next level of care: [place] Barriers to Discharge: No Barriers Identified   Patient Goals and CMS Choice      Discharge Placement                Patient chooses bed at: [facility] Patient to be transferred to facility by: [place] Name of family member notified: Dewayne Patient and family notified of of transfer: 12/31/22  Discharge Plan and Services Additional resources added to the After Visit Summary for                                       Social Determinants of Health (SDOH) Interventions SDOH Screenings   Food Insecurity: No Food Insecurity (12/29/2022)  Housing: Low Risk  (12/29/2022)  Transportation Needs: No Transportation Needs (12/29/2022)  Utilities: Not At Risk (12/29/2022)  Alcohol Screen: Low Risk  (07/05/2022)  Financial Resource Strain: Low Risk  (07/05/2022)  Tobacco Use: Low Risk  (12/29/2022)     Readmission Risk Interventions    07/23/2022    2:19 PM 04/09/2022    4:17 PM  Readmission Risk Prevention Plan  Transportation Screening Complete Complete  PCP or Specialist Appt within 3-5 Days  Complete  HRI or [place]  Complete  Social  Work Consult for [place] Planning/Counseling  [place] Screening  Not Applicable  Medication Review Press photographer) Complete Referral to Pharmacy  PCP or Specialist appointment within 3-5 days of discharge Complete   HRI or [place] Complete   SW Recovery Care/Counseling Consult Complete   Palliative Care Screening Not [place] Complete    Beckey Rutter, MSW, [place], [place] Transitions of Care  Clinical Social Worker I

## 2022-12-31 NOTE — Progress Notes (Signed)
   12/31/22 0307  BiPAP/CPAP/SIPAP  BiPAP/CPAP/SIPAP Pt Type Adult  BiPAP/CPAP/SIPAP SERVO  Mask Type Full face mask  Mask Size Medium  Respiratory Rate 14 breaths/min  Pressure Support 5 cmH20  PEEP 5 cmH20  FiO2 (%) 40 %  Minute Ventilation 6.9  Leak 9  Peak Inspiratory Pressure (PIP) 11  Tidal Volume (Vt) 566  Patient Home Equipment No  Auto Titrate No  Press High Alarm 25 cmH2O  CPAP/SIPAP surface wiped down Yes  BiPAP/CPAP /SiPAP Vitals  Pulse Rate 72  Resp 14  SpO2 94 %  Bilateral Breath Sounds Diminished   Pt requested to be placed on Servo Air NIV PS (PS 5, +5, 40%, back up RR 15) therapy for the night. No distress noted on doc settings. RN aware. RT to follow.

## 2022-12-31 NOTE — Progress Notes (Signed)
Heart Failure Navigator Progress Note  Assessed for Heart & Vascular TOC clinic readiness.  Patient does not meet criteria due to per MD note admission less likely CHF exacerbation.   Navigator will sign off at this time.   Earnestine Leys, BSN, Clinical cytogeneticist Only

## 2022-12-31 NOTE — Progress Notes (Signed)
Eduard Roux to be D/C'd to [facility] per MD order. Attempted report to [facility], call back number left.  Skin clean and dry with scattered bruising and redness.  IV catheter discontinued intact. Site without signs and symptoms of complications. Dressing and pressure applied.  An After Visit Summary was printed and given to [place]  Patient escorted via stretcher, and D/C to [facility] via [place].  Melonie [place]  12/31/2022 11:47 AM

## 2023-05-28 ENCOUNTER — Inpatient Hospital Stay (HOSPITAL_COMMUNITY)
Admission: EM | Admit: 2023-05-28 | Discharge: 2023-06-08 | DRG: 291 | Disposition: A | Discharge status: Skilled Nursing Facility | Payer: Medicare Other | Source: Skilled Nursing Facility | Attending: Family Medicine | Admitting: Family Medicine

## 2023-05-28 ENCOUNTER — Emergency Department (HOSPITAL_COMMUNITY): Payer: Medicare Other

## 2023-05-28 DIAGNOSIS — E039 Hypothyroidism, unspecified: Secondary | ICD-10-CM | POA: Diagnosis present

## 2023-05-28 DIAGNOSIS — Z7985 Long-term (current) use of injectable non-insulin antidiabetic drugs: Secondary | ICD-10-CM

## 2023-05-28 DIAGNOSIS — E1169 Type 2 diabetes mellitus with other specified complication: Secondary | ICD-10-CM | POA: Diagnosis present

## 2023-05-28 DIAGNOSIS — R058 Other specified cough: Secondary | ICD-10-CM

## 2023-05-28 DIAGNOSIS — J449 Chronic obstructive pulmonary disease, unspecified: Secondary | ICD-10-CM | POA: Diagnosis present

## 2023-05-28 DIAGNOSIS — Z888 Allergy status to other drugs, medicaments and biological substances status: Secondary | ICD-10-CM

## 2023-05-28 DIAGNOSIS — K219 Gastro-esophageal reflux disease without esophagitis: Secondary | ICD-10-CM | POA: Diagnosis present

## 2023-05-28 DIAGNOSIS — E782 Mixed hyperlipidemia: Secondary | ICD-10-CM | POA: Diagnosis present

## 2023-05-28 DIAGNOSIS — I480 Paroxysmal atrial fibrillation: Secondary | ICD-10-CM | POA: Diagnosis not present

## 2023-05-28 DIAGNOSIS — I080 Rheumatic disorders of both mitral and aortic valves: Secondary | ICD-10-CM | POA: Diagnosis present

## 2023-05-28 DIAGNOSIS — Z6841 Body Mass Index (BMI) 40.0 and over, adult: Secondary | ICD-10-CM

## 2023-05-28 DIAGNOSIS — Z23 Encounter for immunization: Secondary | ICD-10-CM | POA: Diagnosis not present

## 2023-05-28 DIAGNOSIS — I351 Nonrheumatic aortic (valve) insufficiency: Secondary | ICD-10-CM | POA: Diagnosis not present

## 2023-05-28 DIAGNOSIS — Z833 Family history of diabetes mellitus: Secondary | ICD-10-CM

## 2023-05-28 DIAGNOSIS — Z794 Long term (current) use of insulin: Secondary | ICD-10-CM | POA: Diagnosis not present

## 2023-05-28 DIAGNOSIS — I509 Heart failure, unspecified: Secondary | ICD-10-CM | POA: Diagnosis present

## 2023-05-28 DIAGNOSIS — E662 Morbid (severe) obesity with alveolar hypoventilation: Secondary | ICD-10-CM | POA: Diagnosis present

## 2023-05-28 DIAGNOSIS — I959 Hypotension, unspecified: Secondary | ICD-10-CM | POA: Diagnosis present

## 2023-05-28 DIAGNOSIS — Z8249 Family history of ischemic heart disease and other diseases of the circulatory system: Secondary | ICD-10-CM

## 2023-05-28 DIAGNOSIS — F32A Depression, unspecified: Secondary | ICD-10-CM | POA: Diagnosis present

## 2023-05-28 DIAGNOSIS — R197 Diarrhea, unspecified: Secondary | ICD-10-CM | POA: Diagnosis not present

## 2023-05-28 DIAGNOSIS — R000 Tachycardia, unspecified: Secondary | ICD-10-CM | POA: Diagnosis present

## 2023-05-28 DIAGNOSIS — G2581 Restless legs syndrome: Secondary | ICD-10-CM | POA: Diagnosis not present

## 2023-05-28 DIAGNOSIS — R32 Unspecified urinary incontinence: Secondary | ICD-10-CM | POA: Diagnosis present

## 2023-05-28 DIAGNOSIS — G8929 Other chronic pain: Secondary | ICD-10-CM | POA: Diagnosis present

## 2023-05-28 DIAGNOSIS — I110 Hypertensive heart disease with heart failure: Principal | ICD-10-CM | POA: Diagnosis present

## 2023-05-28 DIAGNOSIS — Z880 Allergy status to penicillin: Secondary | ICD-10-CM

## 2023-05-28 DIAGNOSIS — Z91041 Radiographic dye allergy status: Secondary | ICD-10-CM

## 2023-05-28 DIAGNOSIS — Z7401 Bed confinement status: Secondary | ICD-10-CM

## 2023-05-28 DIAGNOSIS — N179 Acute kidney failure, unspecified: Secondary | ICD-10-CM | POA: Diagnosis present

## 2023-05-28 DIAGNOSIS — E8770 Fluid overload, unspecified: Secondary | ICD-10-CM

## 2023-05-28 DIAGNOSIS — M159 Polyosteoarthritis, unspecified: Secondary | ICD-10-CM | POA: Diagnosis present

## 2023-05-28 DIAGNOSIS — Z79899 Other long term (current) drug therapy: Secondary | ICD-10-CM

## 2023-05-28 DIAGNOSIS — J4489 Other specified chronic obstructive pulmonary disease: Secondary | ICD-10-CM | POA: Diagnosis not present

## 2023-05-28 DIAGNOSIS — J9621 Acute and chronic respiratory failure with hypoxia: Secondary | ICD-10-CM | POA: Diagnosis present

## 2023-05-28 DIAGNOSIS — E1165 Type 2 diabetes mellitus with hyperglycemia: Secondary | ICD-10-CM | POA: Diagnosis present

## 2023-05-28 DIAGNOSIS — Z9981 Dependence on supplemental oxygen: Secondary | ICD-10-CM

## 2023-05-28 DIAGNOSIS — Z87891 Personal history of nicotine dependence: Secondary | ICD-10-CM

## 2023-05-28 DIAGNOSIS — Z9049 Acquired absence of other specified parts of digestive tract: Secondary | ICD-10-CM

## 2023-05-28 DIAGNOSIS — Z7984 Long term (current) use of oral hypoglycemic drugs: Secondary | ICD-10-CM

## 2023-05-28 DIAGNOSIS — I152 Hypertension secondary to endocrine disorders: Secondary | ICD-10-CM | POA: Diagnosis present

## 2023-05-28 DIAGNOSIS — D72829 Elevated white blood cell count, unspecified: Secondary | ICD-10-CM | POA: Diagnosis present

## 2023-05-28 DIAGNOSIS — F419 Anxiety disorder, unspecified: Secondary | ICD-10-CM | POA: Diagnosis present

## 2023-05-28 DIAGNOSIS — I350 Nonrheumatic aortic (valve) stenosis: Secondary | ICD-10-CM | POA: Diagnosis not present

## 2023-05-28 DIAGNOSIS — Z8719 Personal history of other diseases of the digestive system: Secondary | ICD-10-CM

## 2023-05-28 DIAGNOSIS — Z8541 Personal history of malignant neoplasm of cervix uteri: Secondary | ICD-10-CM

## 2023-05-28 DIAGNOSIS — I4892 Unspecified atrial flutter: Secondary | ICD-10-CM | POA: Diagnosis present

## 2023-05-28 DIAGNOSIS — E1142 Type 2 diabetes mellitus with diabetic polyneuropathy: Secondary | ICD-10-CM

## 2023-05-28 DIAGNOSIS — E119 Type 2 diabetes mellitus without complications: Secondary | ICD-10-CM

## 2023-05-28 DIAGNOSIS — M109 Gout, unspecified: Secondary | ICD-10-CM | POA: Diagnosis present

## 2023-05-28 DIAGNOSIS — I2510 Atherosclerotic heart disease of native coronary artery without angina pectoris: Secondary | ICD-10-CM | POA: Diagnosis present

## 2023-05-28 DIAGNOSIS — I5033 Acute on chronic diastolic (congestive) heart failure: Secondary | ICD-10-CM | POA: Diagnosis present

## 2023-05-28 DIAGNOSIS — D509 Iron deficiency anemia, unspecified: Secondary | ICD-10-CM | POA: Diagnosis present

## 2023-05-28 DIAGNOSIS — J9622 Acute and chronic respiratory failure with hypercapnia: Secondary | ICD-10-CM | POA: Diagnosis present

## 2023-05-28 DIAGNOSIS — Z90710 Acquired absence of both cervix and uterus: Secondary | ICD-10-CM

## 2023-05-28 DIAGNOSIS — I878 Other specified disorders of veins: Secondary | ICD-10-CM | POA: Diagnosis present

## 2023-05-28 DIAGNOSIS — Z7951 Long term (current) use of inhaled steroids: Secondary | ICD-10-CM

## 2023-05-28 DIAGNOSIS — I890 Lymphedema, not elsewhere classified: Secondary | ICD-10-CM | POA: Diagnosis present

## 2023-05-28 DIAGNOSIS — K580 Irritable bowel syndrome with diarrhea: Secondary | ICD-10-CM | POA: Diagnosis not present

## 2023-05-28 DIAGNOSIS — I5032 Chronic diastolic (congestive) heart failure: Secondary | ICD-10-CM | POA: Diagnosis not present

## 2023-05-28 DIAGNOSIS — Z7901 Long term (current) use of anticoagulants: Secondary | ICD-10-CM | POA: Diagnosis not present

## 2023-05-28 DIAGNOSIS — Z79890 Hormone replacement therapy (postmenopausal): Secondary | ICD-10-CM

## 2023-05-28 LAB — BASIC METABOLIC PANEL
Anion gap: 15 (ref 5–15)
BUN: 32 mg/dL — ABNORMAL HIGH (ref 6–20)
CO2: 29 mmol/L (ref 22–32)
Calcium: 8.6 mg/dL — ABNORMAL LOW (ref 8.9–10.3)
Chloride: 87 mmol/L — ABNORMAL LOW (ref 98–111)
Creatinine, Ser: 1.36 mg/dL — ABNORMAL HIGH (ref 0.44–1.00)
GFR, Estimated: 45 mL/min — ABNORMAL LOW (ref >60)
Glucose, Bld: 320 mg/dL — ABNORMAL HIGH (ref 70–99)
Potassium: 5.2 mmol/L — ABNORMAL HIGH (ref 3.5–5.1)
Sodium: 131 mmol/L — ABNORMAL LOW (ref 135–145)

## 2023-05-28 LAB — I-STAT CHEM 8, ED
BUN: 39 mg/dL — ABNORMAL HIGH (ref 6–20)
Calcium, Ion: 0.97 mmol/L — ABNORMAL LOW (ref 1.15–1.40)
Chloride: 90 mmol/L — ABNORMAL LOW (ref 98–111)
Creatinine, Ser: 1.5 mg/dL — ABNORMAL HIGH (ref 0.44–1.00)
Glucose, Bld: 331 mg/dL — ABNORMAL HIGH (ref 70–99)
HCT: 37 % (ref 36.0–46.0)
Hemoglobin: 12.6 g/dL (ref 12.0–15.0)
Potassium: 5 mmol/L (ref 3.5–5.1)
Sodium: 130 mmol/L — ABNORMAL LOW (ref 135–145)
TCO2: 34 mmol/L — ABNORMAL HIGH (ref 22–32)

## 2023-05-28 LAB — URINALYSIS, MICROSCOPIC (REFLEX): RBC / HPF: NONE SEEN RBC/hpf (ref 0–5)

## 2023-05-28 LAB — URINALYSIS, ROUTINE W REFLEX MICROSCOPIC
Bilirubin Urine: NEGATIVE
Glucose, UA: >=500 mg/dL — AB
Hgb urine dipstick: NEGATIVE
Ketones, ur: NEGATIVE mg/dL
Nitrite: NEGATIVE
Protein, ur: NEGATIVE mg/dL
Specific Gravity, Urine: 1.02 (ref 1.005–1.030)
pH: 6 (ref 5.0–8.0)

## 2023-05-28 LAB — BLOOD GAS, VENOUS
Acid-Base Excess: 7.3 mmol/L — ABNORMAL HIGH (ref 0.0–2.0)
Bicarbonate: 36.3 mmol/L — ABNORMAL HIGH (ref 20.0–28.0)
O2 Saturation: 68.4 %
Patient temperature: 37
pCO2, Ven: 72 mmHg (ref 44–60)
pH, Ven: 7.31 (ref 7.25–7.43)
pO2, Ven: 42 mmHg (ref 32–45)

## 2023-05-28 LAB — CBC WITH DIFFERENTIAL/PLATELET
Abs Immature Granulocytes: 0.13 10*3/uL — ABNORMAL HIGH (ref 0.00–0.07)
Basophils Absolute: 0.1 10*3/uL (ref 0.0–0.1)
Basophils Relative: 1 %
Eosinophils Absolute: 0 10*3/uL (ref 0.0–0.5)
Eosinophils Relative: 0 %
HCT: 36.3 % (ref 36.0–46.0)
Hemoglobin: 11 g/dL — ABNORMAL LOW (ref 12.0–15.0)
Immature Granulocytes: 1 %
Lymphocytes Relative: 9 %
Lymphs Abs: 1.3 10*3/uL (ref 0.7–4.0)
MCH: 30.6 pg (ref 26.0–34.0)
MCHC: 30.3 g/dL (ref 30.0–36.0)
MCV: 100.8 fL — ABNORMAL HIGH (ref 80.0–100.0)
Monocytes Absolute: 0.6 10*3/uL (ref 0.1–1.0)
Monocytes Relative: 4 %
Neutro Abs: 12.5 10*3/uL — ABNORMAL HIGH (ref 1.7–7.7)
Neutrophils Relative %: 85 %
Platelets: 340 10*3/uL (ref 150–400)
RBC: 3.6 MIL/uL — ABNORMAL LOW (ref 3.87–5.11)
RDW: 17.9 % — ABNORMAL HIGH (ref 11.5–15.5)
WBC: 14.5 10*3/uL — ABNORMAL HIGH (ref 4.0–10.5)
nRBC: 0.8 % — ABNORMAL HIGH (ref 0.0–0.2)

## 2023-05-28 LAB — TROPONIN I (HIGH SENSITIVITY)
Troponin I (High Sensitivity): 11 ng/L (ref <18)
Troponin I (High Sensitivity): 16 ng/L (ref <18)

## 2023-05-28 LAB — HEPATIC FUNCTION PANEL
ALT: 14 U/L (ref 0–44)
AST: 25 U/L (ref 15–41)
Albumin: 2.9 g/dL — ABNORMAL LOW (ref 3.5–5.0)
Alkaline Phosphatase: 113 U/L (ref 38–126)
Bilirubin, Direct: 0.2 mg/dL (ref 0.0–0.2)
Indirect Bilirubin: 0.8 mg/dL (ref 0.3–0.9)
Total Bilirubin: 1 mg/dL (ref 0.3–1.2)
Total Protein: 7 g/dL (ref 6.5–8.1)

## 2023-05-28 LAB — MAGNESIUM: Magnesium: 2.3 mg/dL (ref 1.7–2.4)

## 2023-05-28 LAB — HIV ANTIBODY (ROUTINE TESTING W REFLEX): HIV Screen 4th Generation wRfx: NONREACTIVE

## 2023-05-28 LAB — BRAIN NATRIURETIC PEPTIDE: B Natriuretic Peptide: 721.2 pg/mL — ABNORMAL HIGH (ref 0.0–100.0)

## 2023-05-28 LAB — LIPASE, BLOOD: Lipase: 20 U/L (ref 11–51)

## 2023-05-28 LAB — TSH: TSH: 0.625 u[IU]/mL (ref 0.350–4.500)

## 2023-05-28 MED ORDER — GABAPENTIN 400 MG PO CAPS
400.0000 mg | ORAL_CAPSULE | Freq: Three times a day (TID) | ORAL | Status: DC
  Administered 2023-05-28 – 2023-05-29 (×2): 400 mg via ORAL
  Filled 2023-05-28 (×2): qty 1

## 2023-05-28 MED ORDER — METOPROLOL TARTRATE 5 MG/5ML IV SOLN
5.0000 mg | Freq: Once | INTRAVENOUS | Status: AC
  Administered 2023-05-28: 5 mg via INTRAVENOUS
  Filled 2023-05-28: qty 5

## 2023-05-28 MED ORDER — AMIODARONE HCL IN DEXTROSE 360-4.14 MG/200ML-% IV SOLN
60.0000 mg/h | INTRAVENOUS | Status: AC
  Administered 2023-05-28 (×2): 60 mg/h via INTRAVENOUS
  Filled 2023-05-28 (×2): qty 200

## 2023-05-28 MED ORDER — FUROSEMIDE 10 MG/ML IJ SOLN
40.0000 mg | Freq: Once | INTRAMUSCULAR | Status: AC
  Administered 2023-05-28: 40 mg via INTRAVENOUS
  Filled 2023-05-28: qty 4

## 2023-05-28 MED ORDER — OXYCODONE-ACETAMINOPHEN 10-325 MG PO TABS: 1.0000 | ORAL_TABLET | Freq: Four times a day (QID) | ORAL | Status: DC

## 2023-05-28 MED ORDER — AMIODARONE HCL IN DEXTROSE 360-4.14 MG/200ML-% IV SOLN
60.0000 mg/h | INTRAVENOUS | Status: DC
  Administered 2023-05-28: 30 mg/h via INTRAVENOUS
  Administered 2023-05-29 (×3): 60 mg/h via INTRAVENOUS
  Administered 2023-05-29: 30 mg/h via INTRAVENOUS
  Administered 2023-05-30 – 2023-05-31 (×5): 60 mg/h via INTRAVENOUS
  Filled 2023-05-28 (×9): qty 200

## 2023-05-28 MED ORDER — OXYCODONE HCL 5 MG PO TABS
5.0000 mg | ORAL_TABLET | Freq: Four times a day (QID) | ORAL | Status: DC
  Administered 2023-05-28 – 2023-05-29 (×2): 5 mg via ORAL
  Filled 2023-05-28 (×2): qty 1

## 2023-05-28 MED ORDER — AMIODARONE LOAD VIA INFUSION
150.0000 mg | Freq: Once | INTRAVENOUS | Status: AC
  Administered 2023-05-28: 150 mg via INTRAVENOUS
  Filled 2023-05-28: qty 83.34

## 2023-05-28 MED ORDER — APIXABAN 5 MG PO TABS
5.0000 mg | ORAL_TABLET | Freq: Two times a day (BID) | ORAL | Status: DC
  Administered 2023-05-28 – 2023-06-08 (×22): 5 mg via ORAL
  Filled 2023-05-28 (×22): qty 1

## 2023-05-28 MED ORDER — BUDESONIDE 0.5 MG/2ML IN SUSP
0.5000 mg | Freq: Two times a day (BID) | RESPIRATORY_TRACT | Status: DC
  Administered 2023-05-28 – 2023-06-08 (×21): 0.5 mg via RESPIRATORY_TRACT
  Filled 2023-05-28 (×22): qty 2

## 2023-05-28 MED ORDER — BUDESONIDE 0.5 MG/2ML IN SUSP: 0.5000 mg | Freq: Two times a day (BID) | RESPIRATORY_TRACT | Status: DC

## 2023-05-28 MED ORDER — DIGOXIN 0.25 MG/ML IJ SOLN
0.2500 mg | Freq: Once | INTRAMUSCULAR | Status: AC
  Administered 2023-05-28: 0.25 mg via INTRAVENOUS
  Filled 2023-05-28: qty 2

## 2023-05-28 MED ORDER — OXYCODONE-ACETAMINOPHEN 5-325 MG PO TABS
1.0000 | ORAL_TABLET | Freq: Four times a day (QID) | ORAL | Status: DC
  Administered 2023-05-28 – 2023-05-29 (×2): 1 via ORAL
  Filled 2023-05-28 (×2): qty 1

## 2023-05-28 MED ORDER — ALBUTEROL SULFATE (2.5 MG/3ML) 0.083% IN NEBU: 2.5000 mg | INHALATION_SOLUTION | Freq: Four times a day (QID) | RESPIRATORY_TRACT | Status: DC | PRN

## 2023-05-28 MED ORDER — FUROSEMIDE 10 MG/ML IJ SOLN
80.0000 mg | Freq: Two times a day (BID) | INTRAMUSCULAR | Status: DC
  Administered 2023-05-28 – 2023-05-30 (×4): 80 mg via INTRAVENOUS
  Filled 2023-05-28 (×4): qty 8

## 2023-05-28 NOTE — Progress Notes (Signed)
Attempted to place pt on BiPAP, but pt refused because she needed to get her pain under control. RN notified. Pt stable on 8L [place].

## 2023-05-28 NOTE — Assessment & Plan Note (Signed)
Pulmicort nebulizer ordered Albuterol neb prn

## 2023-05-28 NOTE — Progress Notes (Signed)
Pt transported on BiPAP from ED trauma B to ED room 8 without complications.

## 2023-05-28 NOTE — Assessment & Plan Note (Signed)
Home meds: Torsemide 40 mg BID, Spironolactone 25 mg daily, metoprolol 25 mg daily BNP 721, lasix 40 mg IV x1,  Cardiology, appreciate recs Lasix 80 mg IV twice daily

## 2023-05-28 NOTE — Assessment & Plan Note (Signed)
-   Patient requires 5L at home  - Currently on non-rebreather mask, satting well on  - consider CPAP at night

## 2023-05-28 NOTE — ED Triage Notes (Signed)
Pt BIB [facility] from [facility] for 7/10 CP that began last night.  Pt has hx of afib/aflutter.  Is on Metoprolol and Eliquis among other meds. Pt wears 5L [place] at baseline but is requiring 8-10 NRB on arrival.    EMS gave 20 mg Cardizem 324 ASA around 1300.   HR before Cardizem 118/65 HR 144, After cardizem 102/66 HR 144  Pt was recently tx for shingles, they are non-active at this time

## 2023-05-28 NOTE — Progress Notes (Signed)
FMTS Brief Progress Note  S:Seen at bedside with Dr. Sharion Dove. Patient is awake and alert, mentating and answering questions appropriately.    O: BP 109/76   Pulse (!) 138   Temp 97.9 F (36.6 C) (Oral)   Resp (!) 22   SpO2 100%   Gen:: Morbidly obese, conversant and NAD Cardiac: Regular, tachycardic, no murmur but exam limited by body habitus Pulm: Normal WOB on 8L [place], unable to appreciate lung sounds 2/2 body habitus and ambient noise in the ED  Extremities: BLE with chronic ?lymphedema, overlying skin changes, no pitting or acute edematous changes to my exam.   A/P: Acute Hypoxic Respiratory Failure in the Setting of A Flutter 2:1 . Suspect this is multifactorial. A Flutter + component of CHF, possible contribution of COPD, but no wheeze on my exam. Also cannot rule out PE as contributing factor in this patient with multiple comorbidities and risk factors.  Breathing comfortably on 8L supplemental O2. Uses 5L at home.  VBG with pCO2 72, suspect this is chronic. Uses BiPAP nightly at home. - BiPAP ordered QHS - Loaded with Amio by cardiology. Per their note could consider addition of IV digoxin and possible DCCV later in the week. Though given biatrial enlargement on prior echo, question whether durable sinus rhythm is a possibility for her.  - Awaiting echocardiogram - Started on IV Lasix 80mg  BID per cardiology. CXR suggestive of pulmonary edema  HR remains in the 140s.   - No UOP documented at present, will obtain bladder scan.  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 D/c CT order. NM Perfusion scan ordered. She remains on her home Eliquis   - If condition changes, plan includes consult to CCM For respiratory support . Has a history of prior trach.   Alicia Amel, MD 05/28/2023, 8:45 PM PGY-3,  [facility] Night Resident  Please page [phone] with questions.

## 2023-05-28 NOTE — ED Provider Notes (Signed)
[place] EMERGENCY DEPARTMENT AT [facility] Provider Note   CSN: [id number] Arrival date & time: 05/28/23  1323     History  No chief complaint on file.   Chelsea [name] is a 57 y.o. female.  HPI Patient presents for chest pain and shortness of breath.  Medical history includes atrial flutter, DM, OSA, HTN, GERD, CHF, gout, COPD, depression, HLD, IBS, cervical cancer, anemia, CAD, UGIB.  Prescribed home medications include Eliquis, torsemide.  Her cardiologist is Dr. Tomie China.  Typically, she is in a normal sinus rhythm.  She was started on metoprolol recently.  Dose of metoprolol is 25 mg daily.  Last night, she woke up with staff from the nursing facility surrounding her.  They told her that her oxygen was low.  She was placed on CPAP.  She did start to experience chest discomfort last night.  Chest discomfort continued today.  EMS was called.  EMS noted tachycardia with heart rate in the 140s.  She was given a 20 mg dose of diltiazem without any improvement in her tachycardia.  When she was given this dose, her blood pressure dropped from 110s to 100s systolic.  He was noted to be hypoxic on her baseline 5 L of oxygen.  She was placed on nonrebreather.  Currently, patient endorses some mild chest discomfort.  She denies sensation of palpitations.  She does feel some current shortness of breath.  She states that her lower extremity swelling is baseline.    Home Medications Prior to Admission medications   Medication Sig Start Date End Date Taking? Authorizing Provider  albuterol (PROVENTIL) (2.5 MG/3ML) 0.083% nebulizer solution Take 3 mLs (2.5 mg total) by nebulization every 6 (six) hours as needed for wheezing or shortness of breath (I50.32). Patient not taking: Reported on 12/30/2022 07/04/16   Oretha Milch, MD  ammonium lactate (LAC-HYDRIN) 12 % lotion Apply 1 Application topically every evening. Apply to  legs and feet every evening shift Patient not taking:  Reported on 12/30/2022    [provider]  atorvastatin (LIPITOR) 20 MG tablet Take 20 mg by mouth every evening.    [provider]  budesonide (PULMICORT) 0.5 MG/2ML nebulizer solution Inhale 0.5 mg into the lungs every 12 (twelve) hours.    [provider]  buPROPion (WELLBUTRIN) 75 MG tablet Take 75 mg by mouth 2 (two) times daily.    [provider]  Cholecalciferol 125 MCG (5000 UT) TABS Take 1 tablet (5,000 Units total) by mouth daily. 07/10/22   [name], Werner Lean, MD  Dulaglutide (TRULICITY) 0.75 MG/0.5ML SOPN Inject 0.75 mg into the skin once a week.    [provider]  ELIQUIS 5 MG TABS tablet Take 5 mg by mouth 2 (two) times daily. 04/12/21   [provider]  empagliflozin (JARDIANCE) 10 MG TABS tablet Take 1 tablet (10 mg total) by mouth daily. Patient not taking: Reported on 12/30/2022 10/03/22   Elgergawy, Leana Roe, MD  ferrous sulfate 325 (65 FE) MG EC tablet Take 325 mg by mouth daily.    [provider]  folic acid (FOLVITE) 1 MG tablet Take 1 tablet (1 mg total) by mouth daily. Patient not taking: Reported on 12/30/2022 07/10/22   Maretta Bees, MD  gabapentin (NEURONTIN) 400 MG capsule Take 400 mg by mouth 3 (three) times daily.    [provider]  insulin aspart (FIASP FLEXTOUCH) 100 UNIT/ML FlexTouch Pen Inject 0-24 Units into the skin in the morning, at noon, and  at bedtime. BS 70-119 inject 0 units  BS 120-160 inject 2 units BS 161-200 inject 4 units  BS 201-250 inject 8 units  BS 251-300 inject 12 units  BS 301-350 inject 16 units  BS 351-450 inject 20 units  BS 451 and greater inject 24 units    [provider]  levothyroxine (SYNTHROID) 150 MCG tablet Take 1 tablet (150 mcg total) by mouth daily at 6 (six) AM. Patient taking differently: Take 150 mcg by mouth daily. 07/10/22   [name], Werner Lean, MD  loperamide (IMODIUM A-D) 2 MG tablet Take 2 mg by mouth in the morning, at noon, in the  evening, and at bedtime. Prn order: Take 2 mg by mouth every 4 hours as needed for diarrhea    [provider]  Multiple Vitamin (MULTIVITAMIN) tablet Take 1 tablet by mouth daily.    [provider]  NON FORMULARY Apply BIPAP every night at bedtime.    [provider]  omeprazole (PRILOSEC) 20 MG capsule Take 20 mg by mouth daily. 10/02/22   [provider]  OXYGEN Inhale 5 L/min into the lungs in the morning and at bedtime. Connect to BIPAP qhs    [provider]  potassium chloride SA (KLOR-CON M) 20 MEQ tablet Take 1 tablet (20 mEq total) by mouth daily. 07/10/22   [name], Werner Lean, MD  spironolactone (ALDACTONE) 25 MG tablet Take 1 tablet (25 mg total) by mouth daily. Patient taking differently: Take 12.5 mg by mouth daily. 10/17/22   Robbie Lis M, PA-C  torsemide (DEMADEX) 20 MG tablet Take 2 tablets (40 mg total) by mouth 2 (two) times daily. 10/02/22   Elgergawy, Leana Roe, MD      Allergies    Iodinated contrast media, Penicillins, Insulin lispro, and Minoxidil    Review of Systems   Review of Systems  Constitutional:  Positive for fatigue.  Respiratory:  Positive for shortness of breath.   Cardiovascular:  Positive for chest pain and leg swelling (Baseline).  All other systems reviewed and are negative.   Physical Exam Updated Vital Signs BP (!) 106/92   Pulse (!) 140   Temp 97.7 F (36.5 C) (Oral)   Resp 15   SpO2 92%  Physical Exam Vitals and nursing note reviewed.  Constitutional:      General: She is not in acute distress.    Appearance: She is well-developed. She is ill-appearing (Chronically). She is not toxic-appearing or diaphoretic.  HENT:     Head: Normocephalic and atraumatic.     Right Ear: External ear normal.     Left Ear: External ear normal.     Nose: Nose normal.     Mouth/Throat:     Mouth: Mucous membranes are moist.  Eyes:     Extraocular Movements: Extraocular movements intact.      Conjunctiva/sclera: Conjunctivae normal.  Cardiovascular:     Rate and Rhythm: Regular rhythm. Tachycardia present.     Heart sounds: No murmur heard. Pulmonary:     Effort: Pulmonary effort is normal. No respiratory distress.     Breath sounds: Normal breath sounds. No wheezing, rhonchi or rales.  Abdominal:     General: There is no distension.     Palpations: Abdomen is soft.     Tenderness: There is no abdominal tenderness.  Musculoskeletal:     Cervical back: Normal range of motion and neck supple.     Right lower leg: Edema present.     Left lower leg: Edema  present.  Skin:    General: Skin is warm and dry.     Coloration: Skin is not jaundiced or pale.  Neurological:     General: No focal deficit present.     Mental Status: She is alert and oriented to person, place, and time.  Psychiatric:        Mood and Affect: Mood normal.        Behavior: Behavior normal.     ED Results / Procedures / Treatments   Labs (all labs ordered are listed, but only abnormal results are displayed) Labs Reviewed  BASIC METABOLIC PANEL - Abnormal; Notable for the following components:      Result Value   Sodium 131 (*)    Potassium 5.2 (*)    Chloride 87 (*)    Glucose, Bld 320 (*)    BUN 32 (*)    Creatinine, Ser 1.36 (*)    Calcium 8.6 (*)    GFR, Estimated 45 (*)    All other components within normal limits  HEPATIC FUNCTION PANEL - Abnormal; Notable for the following components:   Albumin 2.9 (*)    All other components within normal limits  CBC WITH DIFFERENTIAL/PLATELET - Abnormal; Notable for the following components:   WBC 14.5 (*)    RBC 3.60 (*)    Hemoglobin 11.0 (*)    MCV 100.8 (*)    RDW 17.9 (*)    nRBC 0.8 (*)    Neutro Abs 12.5 (*)    Abs Immature Granulocytes 0.13 (*)    All other components within normal limits  BRAIN NATRIURETIC PEPTIDE - Abnormal; Notable for the following components:   B Natriuretic Peptide 721.2 (*)    All other components within normal  limits  I-STAT CHEM 8, ED - Abnormal; Notable for the following components:   Sodium 130 (*)    Chloride 90 (*)    BUN 39 (*)    Creatinine, Ser 1.50 (*)    Glucose, Bld 331 (*)    Calcium, Ion 0.97 (*)    TCO2 34 (*)    All other components within normal limits  MAGNESIUM  LIPASE, BLOOD  URINALYSIS, ROUTINE W REFLEX MICROSCOPIC  TROPONIN I (HIGH SENSITIVITY)  TROPONIN I (HIGH SENSITIVITY)    EKG EKG Interpretation Date/Time:  Tuesday May 28 2023 13:30:11 EDT Ventricular Rate:  145 PR Interval:  166 QRS Duration:  139 QT Interval:  349 QTC Calculation: 543 R Axis:   86  Text Interpretation: Atrial flutter Anteroseptal infarct, age indeterminate Confirmed by Gloris Manchester [id number]) on 05/28/2023 1:41:29 PM  Radiology DG Chest Portable 1 View  Result Date: 05/28/2023 CLINICAL DATA:  Chest pain since last night. History of atrial fibrillation and hypertension. Diabetes. EXAM: PORTABLE CHEST 1 VIEW COMPARISON:  12/28/2022 FINDINGS: Shallow inspiration. Diffuse cardiac enlargement. Bilateral perihilar infiltrates, increasing since previous study. These may represent multifocal pneumonia or edema. No pleural effusions. No pneumothorax. Mediastinal contours appear intact. Calcified and tortuous aorta. IMPRESSION: Cardiac enlargement with increasing bilateral pulmonary infiltrates. Electronically Signed   By: Burman Nieves M.D.   On: 05/28/2023 15:41    Procedures Procedures    Medications Ordered in ED Medications  amiodarone (NEXTERONE) 1.8 mg/mL load via infusion 150 mg (150 mg Intravenous Bolus from Bag 05/28/23 1459)    Followed by  amiodarone (NEXTERONE PREMIX) 360-4.14 MG/200ML-% (1.8 mg/mL) IV infusion (60 mg/hr Intravenous New Bag/Given 05/28/23 1458)    Followed by  amiodarone (NEXTERONE PREMIX) 360-4.14 MG/200ML-% (1.8 mg/mL) IV infusion (has  no administration in time range)  digoxin (LANOXIN) 0.25 MG/ML injection 0.25 mg (has no administration in time range)  furosemide  (LASIX) injection 80 mg (has no administration in time range)  metoprolol tartrate (LOPRESSOR) injection 5 mg (5 mg Intravenous Given 05/28/23 1445)  furosemide (LASIX) injection 40 mg (40 mg Intravenous Given 05/28/23 1444)    ED Course/ Medical Decision Making/ A&P Clinical Course as of 05/28/23 1653  Tue May 28, 2023  1539 Atrial flutter, hx same, cards to see, on amio gtt, 7L [place], volume up, got Lasix [JD]    Clinical Course User Index [JD] Laurence Spates, MD                                 Medical Decision Making Amount and/or Complexity of Data Reviewed Labs: ordered. Radiology: ordered.  Risk Prescription drug management.   This patient presents to the ED for concern of chest pain and shortness of breath, this involves an extensive number of treatment options, and is a complaint that carries with it a high risk of complications and morbidity.  The differential diagnosis includes arrhythmia, CHF, pneumonia, reactive airway disease exacerbation, ACS   Co morbidities that complicate the patient evaluation  atrial flutter, DM, OSA, HTN, GERD, CHF, gout, COPD, depression, HLD, IBS, cervical cancer, anemia, CAD, UGIB   Additional history obtained:  Additional history obtained from N/A External records from outside source obtained and reviewed including EMR   Lab Tests:  I Ordered, and personally interpreted labs.  The pertinent results include: AKI, hyperglycemia without evidence of DKA, baseline anemia.  A leukocytosis is present.  BNP is elevated consistent with CHF exacerbation.  Troponin fortunately is normal.   Imaging Studies ordered:  I ordered imaging studies including chest x-ray I independently visualized and interpreted imaging which showed cardiomegaly with increasing bilateral pulmonary infiltrates I agree with the radiologist interpretation   Cardiac Monitoring: / EKG:  The patient was maintained on a cardiac monitor.  I personally viewed and  interpreted the cardiac monitored which showed an underlying rhythm of: Atrial flutter   Consultations Obtained:  I requested consultation with the cardiologist, Dr. Tenny Craw,  and discussed lab and imaging findings as well as pertinent plan - they recommend: Medicine admission, initiation of amiodarone, cardiology will see in consult   Problem List / ED Course / Critical interventions / Medication management  Patient presents for chest discomfort and shortness of breath since last night.  EMS noted narrow complex tachycardia with rate in the range of 145 that was refractory to 20 mg of diltiazem prior to arrival.  She received 304 ASA prior to arrival as well.  On arrival, patient's SpO2 was 93% on 7 L.  At baseline, she is not 5.  She endorses some ongoing mild chest discomfort.  She remains tachycardic with heart rate 145.  Rhythm consistent with atrial flutter.  She is on Eliquis and, per review of nursing facility documentation, has not missed any doses this month.  I discussed this with cardiologist on-call, Dr. Tenny Craw, who advises initiation of amiodarone.  Cardiology will see in consult for further recommendations.  Amiodarone was ordered.  Workup was notable for AKI, elevated BNP, consistent with CHF exacerbation.  She was given IV Lasix.  Patient remained persistently tachycardic but otherwise hemodynamically stable.  She was admitted to medicine for further management. I ordered medication including metoprolol and amiodarone for atrial flutter; Lasix for diuresis Reevaluation  of the patient after these medicines showed that the patient improved I have reviewed the patients home medicines and have made adjustments as needed   Social Determinants of Health:  Resides in nursing facility  CRITICAL CARE Performed by: Gloris Manchester   Total critical care time: 34 minutes  Critical care time was exclusive of separately billable procedures and treating other patients.  Critical care was  necessary to treat or prevent imminent or life-threatening deterioration.  Critical care was time spent personally by me on the following activities: development of treatment plan with patient and/or surrogate as well as nursing, discussions with consultants, evaluation of patient's response to treatment, examination of patient, obtaining history from patient or surrogate, ordering and performing treatments and interventions, ordering and review of laboratory studies, ordering and review of radiographic studies, pulse oximetry and re-evaluation of patient's condition.        Final Clinical Impression(s) / ED Diagnoses Final diagnoses:  Atrial flutter with rapid ventricular response (HCC)  Hypervolemia, unspecified hypervolemia type  Acute on chronic respiratory failure with hypoxia [facility])    Rx / DC Orders ED Discharge Orders     None         Gloris Manchester, MD 05/28/23 [phone]

## 2023-05-28 NOTE — H&P (Addendum)
Hospital Admission History and Physical Service Pager: [phone]  Patient name: EMILSE BOEHNLEIN Medical record number: [id number] Date of Birth: 1966-04-14 Age: 57 y.o. Gender: female  Primary Care Provider: Pcp, No Consultants: Cardiology  Code Status: Full  Preferred Emergency Contact: [name], [phone]  Chief Complaint: shortness of breath and chest pain    Assessment and Plan: BETSAIDA BOW is a 57 y.o. female with a PMHx of HFpEF, aortic valve steonosis, paroxysmal afib/flutter, chronic respiratory failure on continuous O2 (5L home), OSH/OSA and COPD presenting with increasing shortness of breath and chest pain that started last night. Differential for presentation of this includes COPD exacerbation, Pneumonia, PE, ACS  vs CHF exacerbation. Suspect symptom presentation are due to her history of atrial flutter and CHF given elevated BNP 720s, CXR with cardiomegaly and vascular congestion, increased lower extremity edema. Less suspicious of COPD exacerbation without increased cough or sputum production. Suspect leukocytosis is due to solumedrol administered at SNF to improve oxygenation prior to admission. Less suspicious of ACS given negative trops and EKG with atrial flutter.  Patient given loading dose of amiodarone and started on an amiodarone drip cardiology following patient, with med recs echo and started on IV Lasix 80 mg twice daily. Assessment & Plan Atrial flutter with rapid ventricular response (HCC) Home meds: Metoprolol 25 mg daily   Cardiology following Dig x 1 Amiodarone loading dose x1 and currently on drip Acute on chronic diastolic CHF (congestive heart failure), NYHA class 3 (HCC) Home meds: Torsemide 40 mg BID, Spironolactone 25 mg daily, metoprolol 25 mg daily BNP 721, lasix 40 mg IV x1,  Cardiology, appreciate recs Lasix 80 mg IV twice daily COPD (chronic obstructive pulmonary disease) with chronic bronchitis Pulmicort nebulizer  ordered Albuterol neb prn  Acute on chronic respiratory failure with hypoxia (HCC) - Patient requires 5L at home  - Currently on non-rebreather mask, satting well on  - consider CPAP at night   FEN/GI: NPO w/ sips of meds VTE Prophylaxis: Eliquis   Disposition: Most likely back to long term care at [facility]   History of Present Illness:  TRENESHA SPAGNOLI is a 57 y.o. female presenting with intermittent chest discomfort and shortness of breath   Last Friday patient started feeling unwell. She started get of short of breath. Normally on 5L at home. She started having to increase the oxygen on Sunday. Chest pain started Monday at 7/10 in the middle of her chest. She felt like someone put a weight on her chest. Did not radiate. She lives at a facility so she did not try any other medications other than the ones that are given by the facility. No URI symptoms, N/v/d, urinary symptoms. Last bowel movement was yesterday. She says most iof her bowel movements are hard.   Per RN [address], patient was wearing CPAP this morning and patient was somnolent and difficult to arise. Nurse called provider and noticed desats, an elevated HR, stable blood pressure. They put her on non-re breather and got a chest x-ray with some abnormalities . She was transferred to an isolated hall for her shingles outbreak and was treated with acyclovir (off 2 weeks now). First came from [facility] for rehab, after multiple hospitalizations,  and non-compliant with physical therapy. Is compliant with medications.   On transport, she was given Cardizem 20 mg, ASA 324 around 1300 w/ /lack of HR improvement and requiring 8-10 L on non re-breather.   In the ED, WBC  14.5, Cr 1.36, K 5.2, Na 131, BG 320, elevated BNP of 721, EKG w/ atrial flutter, Trops negative, CXR w/ cardiomegaly and b/l pulmonary infiltrates. She was give lasix 40 mg IV, metoprolol 5 mg IV, got a loading dose of amiodarone 150 mg  IV  and started on an amiodarone drip 60 mg/hr.   Review Of Systems: Per HPI with the following additions:   Pertinent Past Medical History: Father Heart Disease  Remainder reviewed in history tab.   Pertinent Past Surgical History: EGD 07/23/2022 Tracheostomy 04/09/2022 Total abdominal hysterectomy 2006  Remainder reviewed in history tab.  Pertinent Social History: Tobacco use: No  Alcohol use: Not currently  Other Substance use: none  Lives at a SNF, [facility]   Pertinent Family History: Hypothyroidism  Acute on chronic CHF  COPD  Chronic hypoxic respiratory failure  Atrial Flutter Diabetes  Cervical Cancer s/p hysterectomy   Remainder reviewed in history tab.   Important Outpatient Medications: Metoprolol 25 mg daily   Eliquis 5 mg BID  Torsemide 40 mg BID  Spironolactone 25 mg daily  Jardiance 10 mg daily  Albuterol nebulizer q6h prn Budesonide nebulizer 0.5 mg q12h Alprazolam 0.5 mg q8h prn  Levothyroxine 150 mcg daily  Gabapentin 400 mg TID Ferrous sulfate 324 mg daily   Remainder reviewed in medication history.   Objective: BP (!) 106/92   Pulse (!) 140   Temp 97.7 F (36.5 C) (Oral)   Resp 15   SpO2 92%   Exam: General: ill appearing, conversational, non-significant other bedside Eyes: EOMI Neck: Large, with well healing scar from prior tracheostomy Cardiovascular: tachycardic, irregular rhythm, no murmurs appreciate Respiratory: Non rebreather, diminished sounds in the bases, clear to auscultation in upper fields Gastrointestinal: Active bowel sounds, non tender to palpation  MSK: Gross motor Ext: 2-3+ pitting edema up to the thights  Derm: honey crusted scabs on lower extremities, healing shingles lesions on right side of back  Neuro: Alert and Oriented,   Labs:  CBC BMET  Recent Labs  Lab 05/28/23 1345 05/28/23 1404  WBC 14.5*  --   HGB 11.0* 12.6  HCT 36.3 37.0  PLT 340  --    Recent Labs  Lab 05/28/23 1345  05/28/23 1404  NA 131* 130*  K 5.2* 5.0  CL 87* 90*  CO2 29  --   BUN 32* 39*  CREATININE 1.36* 1.50*  GLUCOSE 320* 331*  CALCIUM 8.6*  --     Pertinent additional labs:   BNP: 721 Trops 11, normal   EKG: Atrial Flutter, HR 145  Imaging Studies Performed:  Imaging Study   CXR Impression from Radiologist: Cardiac enlargement with increasing bilateral pulmonary infiltrates.    Personal impressions:  Cardiomegaly w/ bilateral pleural effusions   Peterson Ao, MD 05/28/2023, 3:39 PM PGY-1, [facility]  FPTS Intern pager: [phone], text pages welcome Secure chat group [facility] Teaching Service

## 2023-05-28 NOTE — Consult Note (Addendum)
Cardiology Consultation   Patient ID: Chelsea [name] MRN: [id number]; DOB: 03/06/1966  Admit date: 05/28/2023 Date of Consult: 05/28/2023  PCP:  Oneita Hurt No   [facility] Providers Cardiologist:  Donato Schultz, MD        Patient Profile:   Chelsea [name] is a 57 y.o. female with a hx of chronic respiratory failure on 5 L oxygen 24/7 secondary to OHS/OSA, she has a prior history of prior trach reverted in follow-up 2023, COPD, history of upper GI bleed 10/23, paroxysmal atrial fibrillation/atrial flutter, aortic stenosis and HFpEF who is being seen 05/28/2023 for the evaluation of aflutter with RVR and CHF at the request of Dr. Earlene Plater.  History of Present Illness:   Chelsea [name] is a morbidly obese 57 year old female with past medical history of chronic respiratory failure on 5 L oxygen 24/7 secondary to OHS/OSA, she has a prior history of prior trach reverted in follow-up 2023, COPD, history of upper GI bleed 10/23, paroxysmal atrial fibrillation/atrial flutter, aortic stenosis and HFpEF.  Patient had ablation planned at [facility] in October 2022 but lost to follow-up.  She was admitted with volume overload in October 2023 after GI bleed.  She required multiple blood transfusions.  Echocardiogram obtained on 07/30/2022 demonstrated EF 60 to 65%, no regional wall motion abnormality, grade 2 DD, mildly enlarged RV with normal RVEF, severe LAE, trivial MR, moderate aortic stenosis.  She was readmitted in December 2023 due to acute on chronic respiratory failure with hypoxia secondary to COPD exacerbation and acute on chronic diastolic heart failure with RV failure.  She refused BiPAP during the admission.  She underwent IV diuresis and was ultimately discharged on torsemide, spironolactone and Jardiance.  After discharge, patient was seen by [facility] heart failure clinic in January 2024 at which time she was doing well.  She was most recently admitted in April 2020 for  due to acute on chronic diastolic heart failure.  She underwent IV diuresis.  Cardiology service was not consulted during the admission.  Patient ultimately discharged on Jardiance, spironolactone 25 mg daily and torsemide 40 mg twice daily.  At baseline, she is completely bedbound.  She currently resides in [facility] skilled nursing facility.  Medication is being managed by skilled nursing facility staff.  She has not had any decreased urination recently. In the past 5 to 7 days, she has been having increasing dyspnea.  She also complained of intermittent chest pressure.  She was sent to [facility], ED in the morning of 05/28/2023 due to worsening dyspnea.  O2 saturation was in the 70s.  She also complained of constant chest pain in the center of her chest.  She was recently started on low-dose metoprolol for her atrial flutter.  Heart rate on EMS arrival was in the 140s.  She was given 20 mg Cardizem without improvement in her tachycardia, however blood pressure dropped to 100 systolic after receiving Cardizem.  She was placed on nonrebreather.  Due to persistent tachycardia, she was ultimately placed on IV amiodarone.   Past Medical History:  Diagnosis Date   Acquired hypothyroidism 11/13/2010   Qualifier: Diagnosis of   By: Jens Som, MD, Lyn Hollingshead      Acute congestive heart failure (HCC) 03/18/2021   Acute idiopathic gout of left hand 02/16/2019   Acute on chronic diastolic CHF (congestive heart failure) (HCC) 09/25/2017   Acute on chronic diastolic CHF (congestive heart failure), NYHA class 3 (HCC) 09/25/2017   Acute on chronic  hypoxic respiratory failure (HCC) 07/04/2022   Acute on chronic respiratory failure with hypoxia (HCC) 09/17/2022   Acute on chronic respiratory failure with hypoxia and hypercapnia (HCC) 04/07/2022   Acute respiratory failure with hypoxia and hypercarbia (HCC) 05/27/2016   Arthritis    "qwhere" (12/05/2017)   Asthma    Atrial flutter (HCC)  03/09/2021   Bleeding of the respiratory tract 04/07/2022   Bronchitis 05/30/2011   Cervical cancer (HCC) 2006   CHF (congestive heart failure) (HCC)    CHF exacerbation (HCC) 07/30/2022   Chronic diastolic heart failure (HCC) 11/14/2010      11/2010 322 lbs      Chronic heart failure with preserved ejection fraction (HCC) 02/23/2018   Chronic lower back pain    Chronic respiratory failure with hypoxia (HCC) 07/02/2018   ABG 11/2017 7.36/69   Consistent with obesity hypoventilation syndrome   Complication of anesthesia    "I have a hard time waking up from under it" (12/05/2017)   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   COPD with acute exacerbation (HCC) 03/04/2015   Coronary artery disease    Diastolic congestive heart failure (HCC)    Echo 02/21/11: EF 55-60%, moderate LVH, grade 2 diastolic dysfunction, mild to moderate aortic stenosis with a mean gradient 23 mm of mercury   DJD (degenerative joint disease) 07/03/2021   DM2 (diabetes mellitus, type 2) (HCC) 11/10/2010   Dyspnea 12/05/2017   Dyspnea on exertion 02/09/2021   Edema 03/22/2011   Essential (primary) hypertension 11/10/2010   Formatting of this note might be different from the original.  Overview:   Qualifier: Diagnosis of   By: Kem Parkinson      Last Assessment & Plan:   Watch blood pressure off of minoxidil and add medications as needed.   Essential hypertension 11/10/2010   Qualifier: Diagnosis of   By: Kem Parkinson       Gastro-esophageal reflux disease without esophagitis 11/10/2010   GERD 11/10/2010   Qualifier: Diagnosis of   By: Kem Parkinson       Hair loss 09/17/2019   Heart failure (HCC) 04/20/2021   Heart murmur    Hepatitis C    History of cervical cancer 03/22/2011   History of gout X 1   History of tobacco use 04/20/2021   Hx of atrial flutter 03/15/2021   Hyperglycemia due to type 2 diabetes mellitus (HCC) 04/20/2021   Hyperlipidemia     Hypertension    Hypertension associated with diabetes (HCC) 08/25/2018   Irritable bowel syndrome with constipation 04/20/2021   Irritable bowel syndrome with diarrhea 06/18/2019   Limitation of activity due to disability 09/18/2021   Lymphedema of both lower extremities 03/22/2011   Metabolic alkalosis 03/28/2021   Microalbuminuria 04/20/2021   Migraine    "monthly" (12/05/2017)   Mild tricuspid regurgitation 04/02/2021   Mitral valve sclerosis 04/02/2021   Mixed hyperlipidemia 04/18/2020   Moderate aortic stenosis 04/20/2021   Mononeuropathy of lower extremity 04/09/2011   Formatting of this note might be different from the original.  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  Severe, corrected by CPAP 18 with C-flex of 3  2/13 bipap 20/14 sm ff mask h/h biflex+3cm  Formatting of this note might be different from the original.  Sleep study 06/2020: IMPRESSIONS  - Moderate obstructive sleep apnea occurred during this study (AHI = 17.7/h), mostly REM related  - No significant central sleep apnea occurred during   On home oxygen therapy    "2L all the time" (12/05/2017)   On supplemental oxygen by nasal cannula 04/20/2021   Onychomycosis 06/18/2019   OSA treated with BiPAP    "have CPAP at home too; wearing BiPAP right now" (12/05/2017)   Osteoarthritis 11/28/2010   Qualifier: Diagnosis of   By: Huntley Dec,  Scott       Paroxysmal atrial fibrillation (HCC) 09/04/2021   Physical debility 05/10/2018   Formatting of this note might be different from the original.  Due to morbid obesity   Physical deconditioning 04/24/2021   Pneumonia    "several times" (12/05/2017)   Pneumonia due to gram-positive bacteria 08/24/2017   Pressure injury of both heels, unstageable (HCC) 07/03/2021   Pulmonary edema, acute (HCC) 02/21/2022   Renal insufficiency 04/20/2021   Restless leg syndrome 03/22/2021   Restrictive lung disease secondary to obesity 01/28/2022   S/P hysterectomy 02/19/2021   Tinea pedis of both feet 11/12/2018   Upper GI bleed 07/18/2022   Venous insufficiency of both lower extremities 07/02/2021   Weakness 03/20/2021   Weight loss 03/15/2021    Past Surgical History:  Procedure Laterality Date   ABDOMINAL SURGERY  2006 X 2   "TAH incision was infected"   BIOPSY  07/21/2022   Procedure: BIOPSY;  Surgeon: Kathi Der, MD;  Location: [facility];  Service: Gastroenterology;;   CHOLECYSTECTOMY OPEN     ESOPHAGOGASTRODUODENOSCOPY N/A 07/21/2022   Procedure: ESOPHAGOGASTRODUODENOSCOPY (EGD);  Surgeon: Kathi Der, MD;  Location: [facility];  Service: Gastroenterology;  Laterality: N/A;   TONSILLECTOMY     TOTAL ABDOMINAL HYSTERECTOMY  2006   TRACHEOSTOMY REVISION N/A 04/09/2022   Procedure: CONTROL OF BLEEDING,TRACHEOSTOMY SITE;  Surgeon: Christia Reading, MD;  Location: [facility] OR;  Service: ENT;  Laterality: N/A;     Home Medications:  Prior to Admission medications   Medication Sig Start Date End Date Taking? Authorizing Provider  albuterol (PROVENTIL) (2.5 MG/3ML) 0.083% nebulizer solution Take 3 mLs (2.5 mg total) by nebulization every 6 (six) hours as needed for wheezing or shortness of breath (I50.32). Patient not taking: Reported on 12/30/2022 07/04/16   Oretha Milch, MD  ammonium lactate (LAC-HYDRIN) 12 % lotion Apply 1 Application topically every evening. Apply to  legs and  feet every evening shift Patient not taking: Reported on 12/30/2022    [provider]  atorvastatin (LIPITOR) 20 MG tablet Take 20 mg by mouth every evening.    [provider]  budesonide (PULMICORT) 0.5 MG/2ML nebulizer solution Inhale 0.5 mg into the lungs every 12 (twelve) hours.    [provider]  buPROPion (WELLBUTRIN) 75 MG tablet Take 75 mg by mouth 2 (two) times daily.    [provider]  Cholecalciferol 125 MCG (5000 UT) TABS Take 1 tablet (5,000 Units total) by mouth daily. 07/10/22   [name], Werner Lean, MD  Dulaglutide (TRULICITY) 0.75 MG/0.5ML SOPN Inject 0.75 mg into the skin  once a week.    [provider]  ELIQUIS 5 MG TABS tablet Take 5 mg by mouth 2 (two) times daily. 04/12/21   [provider]  empagliflozin (JARDIANCE) 10 MG TABS tablet Take 1 tablet (10 mg total) by mouth daily. Patient not taking: Reported on 12/30/2022 10/03/22   Elgergawy, Leana Roe, MD  ferrous sulfate 325 (65 FE) MG EC tablet Take 325 mg by mouth daily.    [provider]  folic acid (FOLVITE) 1 MG tablet Take 1 tablet (1 mg total) by mouth daily. Patient not taking: Reported on 12/30/2022 07/10/22   Maretta Bees, MD  gabapentin (NEURONTIN) 400 MG capsule Take 400 mg by mouth 3 (three) times daily.    [provider]  insulin aspart (FIASP FLEXTOUCH) 100 UNIT/ML FlexTouch Pen Inject 0-24 Units into the skin in the morning, at noon, and at bedtime. BS 70-119 inject 0 units  BS 120-160 inject 2 units BS 161-200 inject 4 units  BS 201-250 inject 8 units  BS 251-300 inject 12 units  BS 301-350 inject 16 units  BS 351-450 inject 20 units  BS 451 and greater inject 24 units    [provider]  levothyroxine (SYNTHROID) 150 MCG tablet Take 1 tablet (150 mcg total) by mouth daily at 6 (six) AM. Patient taking differently: Take 150 mcg by mouth daily. 07/10/22   [name], Werner Lean, MD  loperamide (IMODIUM A-D) 2 MG tablet Take 2  mg by mouth in the morning, at noon, in the evening, and at bedtime. Prn order: Take 2 mg by mouth every 4 hours as needed for diarrhea    [provider]  Multiple Vitamin (MULTIVITAMIN) tablet Take 1 tablet by mouth daily.    [provider]  NON FORMULARY Apply BIPAP every night at bedtime.    [provider]  omeprazole (PRILOSEC) 20 MG capsule Take 20 mg by mouth daily. 10/02/22   [provider]  OXYGEN Inhale 5 L/min into the lungs in the morning and at bedtime. Connect to BIPAP qhs    [provider]  potassium chloride SA (KLOR-CON M) 20 MEQ tablet Take 1 tablet (20 mEq total) by mouth daily. 07/10/22   [name], Werner Lean, MD  spironolactone (ALDACTONE) 25 MG tablet Take 1 tablet (25 mg total) by mouth daily. Patient taking differently: Take 12.5 mg by mouth daily. 10/17/22   Robbie Lis M, PA-C  torsemide (DEMADEX) 20 MG tablet Take 2 tablets (40 mg total) by mouth 2 (two) times daily. 10/02/22   Elgergawy, Leana Roe, MD    Inpatient Medications: Scheduled Meds:  Continuous Infusions:  amiodarone 60 mg/hr (05/28/23 1458)   Followed by   amiodarone     PRN Meds:   Allergies:    Allergies  Allergen Reactions   Iodinated Contrast Media Anaphylaxis   Penicillins Anaphylaxis    Patient tolerates cefepime (08/2017)   Insulin Lispro Other (See Comments)    Adder per MAR from SNF   Minoxidil Hives    Social History:   Social History   Socioeconomic History   Marital status: Significant Other    Spouse name: Not on file   Number of children: Not on file   Years of education: Not on file   Highest education level: Not on file  Occupational History   Occupation: disabled  Tobacco Use   Smoking status: Never   Smokeless tobacco: Never  Vaping Use   Vaping status: Never Used  Substance and Sexual  Activity   Alcohol use: Not Currently   Drug use: No   Sexual activity: Not Currently  Other Topics Concern   Not on file   Social History Narrative   Not on file   Social Determinants of Health   Financial Resource Strain: Low Risk  (07/05/2022)   Overall Financial Resource Strain (CARDIA)    Difficulty of Paying Living Expenses: Not very hard  Food Insecurity: No Food Insecurity (12/29/2022)   Hunger Vital Sign    Worried About Running Out of Food in the Last Year: Never true    Ran Out of Food in the Last Year: Never true  Transportation Needs: No Transportation Needs (12/29/2022)   PRAPARE - Administrator, Civil Service (Medical): No    Lack of Transportation (Non-Medical): No  Physical Activity: Insufficiently Active (03/27/2021)   Received from [facility], [facility]   Exercise Vital Sign    Days of Exercise per Week: 4 days    Minutes of Exercise per Session: 30 min  Stress: No Stress Concern Present (09/15/2021)   Received from [facility], [facility] - Occupational Stress Questionnaire    Feeling of Stress : Only a little  Social Connections: Unknown (02/04/2022)   Received from [facility], [facility]   Social Network    Social Network: Not on file  Intimate Partner Violence: Not At Risk (12/29/2022)   Humiliation, Afraid, Rape, and Kick questionnaire    Fear of Current or Ex-Partner: No    Emotionally Abused: No    Physically Abused: No    Sexually Abused: No    Family History:    Family History  Problem Relation Age of Onset   Heart disease Father      ROS:  Please see the history of present illness.   All other ROS reviewed and negative.     Physical Exam/Data:   Vitals:   05/28/23 1345 05/28/23 1400 05/28/23 1445 05/28/23 1500  BP: 101/84 103/72 104/84 (!) 106/92  Pulse: (!) 142 89 (!) 143 (!) 140  Resp: 16 15 15    Temp:      TempSrc:      SpO2: 92% 91% 94% 92%   No intake or output data in the 24 hours ending 05/28/23 1552    12/31/2022    4:11 AM 12/30/2022    1:00 AM 12/29/2022    5:00 AM  Last 3  Weights  Weight (lbs) 308 lb 3.3 oz 308 lb 6.8 oz 306 lb 14.1 oz  Weight (kg) 139.8 kg 139.9 kg 139.2 kg     There is no height or weight on file to calculate BMI.  General: Bedbound, no acute distress. HEENT: normal Neck: no JVD Vascular: No carotid bruits; Distal pulses 2+ bilaterally Cardiac: Tachycardic.; no murmur  Lungs: Diminished breath sounds in the right base of the lung. Abd: soft, nontender, no hepatomegaly  Ext: no edema Musculoskeletal:  No deformities, BUE and BLE strength normal and equal Skin: warm and dry  Neuro:  CNs 2-12 intact, no focal abnormalities noted Psych:  Normal affect   EKG:  The EKG was personally reviewed and demonstrates: 2:1 atrial flutter with RVR  Telemetry:  Telemetry was personally reviewed and demonstrates:  atrial flutter with RVR vs afib with RVR vs less likely sinus tach  Relevant CV Studies:  Echo 07/30/2022  1. Left ventricular ejection fraction, by estimation, is 60 to 65%. The  left ventricle has normal function.  The left ventricle has no regional  wall motion abnormalities. There is mild left ventricular hypertrophy.  Left ventricular diastolic parameters  are consistent with Grade II diastolic dysfunction (pseudonormalization).  Elevated left ventricular end-diastolic pressure.   2. Right ventricular systolic function is normal. The right ventricular  size is mildly enlarged. Tricuspid regurgitation signal is inadequate for  assessing PA pressure.   3. Left atrial size was severely dilated.   4. The mitral valve is degenerative. Trivial mitral valve regurgitation.  No evidence of mitral stenosis.   5. The aortic valve is abnormal. Unable to determine aortic valve  morphology due to image quality. There is moderate calcification of the  aortic valve. Aortic valve regurgitation is trivial. Moderate aortic valve  stenosis. Aortic valve area, by VTI  measures 1.16 cm. Aortic valve mean gradient measures 31.0 mmHg. Aortic  valve  Vmax measures 3.58 m/s.   6. The inferior vena cava is dilated in size with >50% respiratory  variability, suggesting right atrial pressure of 8 mmHg.   7. Increased flow velocities may be secondary to anemia, thyrotoxicosis,  hyperdynamic or high flow state.   Laboratory Data:  High Sensitivity Troponin:   Recent Labs  Lab 05/28/23 1345  TROPONINIHS 11     Chemistry Recent Labs  Lab 05/28/23 1345 05/28/23 1404  NA 131* 130*  K 5.2* 5.0  CL 87* 90*  CO2 29  --   GLUCOSE 320* 331*  BUN 32* 39*  CREATININE 1.36* 1.50*  CALCIUM 8.6*  --   MG 2.3  --   GFRNONAA 45*  --   ANIONGAP 15  --     Recent Labs  Lab 05/28/23 1345  PROT 7.0  ALBUMIN 2.9*  AST 25  ALT 14  ALKPHOS 113  BILITOT 1.0   Lipids No results for input(s): "CHOL", "TRIG", "HDL", "LABVLDL", "LDLCALC", "CHOLHDL" in the last 168 hours.  Hematology Recent Labs  Lab 05/28/23 1345 05/28/23 1404  WBC 14.5*  --   RBC 3.60*  --   HGB 11.0* 12.6  HCT 36.3 37.0  MCV 100.8*  --   MCH 30.6  --   MCHC 30.3  --   RDW 17.9*  --   PLT 340  --    Thyroid No results for input(s): "TSH", "FREET4" in the last 168 hours.  BNP Recent Labs  Lab 05/28/23 1345  BNP 721.2*    DDimer No results for input(s): "DDIMER" in the last 168 hours.   Radiology/Studies:  DG Chest Portable 1 View  Result Date: 05/28/2023 CLINICAL DATA:  Chest pain since last night. History of atrial fibrillation and hypertension. Diabetes. EXAM: PORTABLE CHEST 1 VIEW COMPARISON:  12/28/2022 FINDINGS: Shallow inspiration. Diffuse cardiac enlargement. Bilateral perihilar infiltrates, increasing since previous study. These may represent multifocal pneumonia or edema. No pleural effusions. No pneumothorax. Mediastinal contours appear intact. Calcified and tortuous aorta. IMPRESSION: Cardiac enlargement with increasing bilateral pulmonary infiltrates. Electronically Signed   By: Burman Nieves M.D.   On: 05/28/2023 15:41     Assessment and  Plan:   Acute on chronic diastolic heart failure with RV failure: Likely driven by tachycardia.  Obtain echocardiogram.  Likely contributing to intermittent chest tightness. Start on 80mg  BID IV lasix  2-1 atrial flutter: Patient was previously referred to [facility] in 2022 for consideration of ablation, she was lost to follow-up since.  Last echocardiogram in October 2023 showed severe biatrial enlargement.  Holding sinus rhythm will be difficult without antiarrhythmic therapy.  Agree  with initiation with antiarrhythmic therapy. Will discuss with MD, heart rate remained elevated, potentially add a dose of IV digoxin on top of IV amiodarone. If unable to get HR under control by tomorrow, will need DCCV this Thurs or Friday  Leukocytosis: White blood cell count 14.5.  She denies any fever or chill.  Obtain urinalysis  Chronic respiratory failure with OHS and OSA: on 5 L oxygen at baseline.  History of upper GI bleed in October 2023 requiring blood transfusion: Hemoglobin stable  Aortic stenosis: Moderate aortic stenosis noted on previous echocardiogram in October 2023, she would not be a candidate for any intervention.  A repeat echocardiogram given heart failure.  Mild AKI: maybe related to venous congestion   Risk Assessment/Risk Scores:        New York Heart Association (NYHA) Functional Class NYHA Class IV  CHA2DS2-VASc Score = 5   This indicates a 7.2% annual risk of stroke. The patient's score is based upon: CHF History: 1 HTN History: 1 Diabetes History: 1 Stroke History: 0 Vascular Disease History: 1 Age Score: 0 Gender Score: 1     For questions or updates, please contact [facility] Please consult [email or web address] for contact info under    Signed, Azalee Course, [place]  05/28/2023 3:52 PM  Patient seen and examined   I agree with findings as noted above by Chelsea [name] above  Pt is a 56 yo with hx of morbid obesity, chronic resp failure (on 5L O2),  PAF/paroxysmal atrial flutter,  moderate aortic stenosis, HFpEF (several admissions, last echo in 2023 LVEF 60 to 65%) who we are asked to see re atrial flutter with RVR The pt is bedbound, living in SNF  The pt says she has noticed her heart racing for about 1 wk  Has developed SOB and chest pressure (constant) in chest   In EMS/ER has had HR in 140s   SBP 100s (after 20 mg cardiazem given)  Now on IV amiodarone  CXR with congestion  On exam PT is a morbidly obese 57 yo who appears uncomfortable   Sweaty NEck  Unable to assess JVP Lungs   Relatively clear anteriorly   Cardiac exam  Tachycardic   Gr II/VI systolic murmur LSB to base Abd  Obese     Ext 2+ edema with chronic skin changes   Mild weeping   REcomm Atrial flutter    Currently on IV amiodarone  SBP 100s     Long term, amiodaron is not a great option for her with such severe lung dz   As noted above with try digoxin IV to obtain better rate control Pt has been on Eliquis with no missing doses   CHF   PT with congestion on CT  Would give IV lasix   May be due to diastolic dysfunction in setting of rapid rates and AS   Repeat echo when rates slow   AS    Mod on last echo in 2023   Repeat when rates slow    Will continue to follow   Dietrich Pates MD

## 2023-05-28 NOTE — Progress Notes (Signed)
I have examined the patient and reviewed her chart. Will cosign resident note as becomes available.  Ellwood Dense, DO Family Medicine Teaching Service

## 2023-05-28 NOTE — ED Notes (Signed)
ED TO INPATIENT HANDOFF REPORT  ED Nurse Name and Phone #: Delfin Edis [phone]  S Name/Age/Gender Chelsea [name] 57 y.o. female Room/Bed: 008C/008C  Code Status   Code Status: Full Code  Home/SNF/Other Home Patient oriented to: self, place, time, and situation Is this baseline? Yes   Triage Complete: Triage complete  Chief Complaint CHF exacerbation (HCC) [I50.9]  Triage Note Pt BIB [facility] from [facility] for 7/10 CP that began last night.  Pt has hx of afib/aflutter.  Is on Metoprolol and Eliquis among other meds. Pt wears 5L [place] at baseline but is requiring 8-10 NRB on arrival.    EMS gave 20 mg Cardizem 324 ASA around 1300.   HR before Cardizem 118/65 HR 144, After cardizem 102/66 HR 144  Pt was recently tx for shingles, they are non-active at this time   Allergies Allergies  Allergen Reactions   Iodinated Contrast Media Anaphylaxis   Penicillins Anaphylaxis    Patient tolerates cefepime (08/2017)   Insulin Lispro Other (See Comments)    Adder per [facility] from SNF   Minoxidil Hives    Level of Care/Admitting Diagnosis ED Disposition     ED Disposition  Admit   Condition  --   Comment  Hospital Area: [facility] [[id number]]  Level of Care: Progressive [102]  Admit to Progressive based on following criteria: CARDIOVASCULAR & THORACIC of moderate stability with acute coronary syndrome symptoms/low risk myocardial infarction/hypertensive urgency/arrhythmias/heart failure potentially compromising stability and stable post cardiovascular intervention patients.  Admit to Progressive based on following criteria: RESPIRATORY PROBLEMS hypoxemic/hypercapnic respiratory failure that is responsive to NIPPV (BiPAP) or High Flow Nasal Cannula (6-80 lpm). Frequent assessment/intervention, no > Q2 hrs < Q4 hrs, to maintain oxygenation and pulmonary hygiene.  May admit patient to [facility] or [facility] if equivalent level of care is  available:: No  Covid Evaluation: Asymptomatic - no recent exposure (last 10 days) testing not required  Diagnosis: CHF exacerbation [facility]) [[id number]]  Admitting Physician: Peterson Ao [[id number]]  Attending Physician: Westley Chandler [[id number]]  Certification:: I certify this patient will need inpatient services for at least 2 midnights  Expected Medical Readiness: 06/01/2023          B Medical/Surgery History Past Medical History:  Diagnosis Date   Acquired hypothyroidism 11/13/2010   Qualifier: Diagnosis of   By: Jens Som, MD, Lyn Hollingshead      Acute congestive heart failure (HCC) 03/18/2021   Acute idiopathic gout of left hand 02/16/2019   Acute on chronic diastolic CHF (congestive heart failure) (HCC) 09/25/2017   Acute on chronic diastolic CHF (congestive heart failure), NYHA class 3 (HCC) 09/25/2017   Acute on chronic hypoxic respiratory failure (HCC) 07/04/2022   Acute on chronic respiratory failure with hypoxia (HCC) 09/17/2022   Acute on chronic respiratory failure with hypoxia and hypercapnia (HCC) 04/07/2022   Acute respiratory failure with hypoxia and hypercarbia (HCC) 05/27/2016   Arthritis    "qwhere" (12/05/2017)   Asthma    Atrial flutter (HCC) 03/09/2021   Bleeding of the respiratory tract 04/07/2022   Bronchitis 05/30/2011   Cervical cancer (HCC) 2006   CHF (congestive heart failure) (HCC)    CHF exacerbation (HCC) 07/30/2022   Chronic diastolic heart failure (HCC) 11/14/2010      11/2010 322 lbs      Chronic heart failure with preserved ejection fraction (HCC) 02/23/2018   Chronic lower back pain    Chronic respiratory failure with hypoxia (HCC) 07/02/2018  ABG 11/2017 7.36/69   Consistent with obesity hypoventilation syndrome   Complication of anesthesia    "I have a hard time waking up from under it" (12/05/2017)   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  Overview:   Qualifier: Diagnosis of   By: Kem Parkinson      Last Assessment & Plan:   Watch blood pressure off of minoxidil and add medications as needed.   Essential hypertension 11/10/2010   Qualifier: Diagnosis of   By: Kem Parkinson       Gastro-esophageal reflux disease without esophagitis 11/10/2010   GERD 11/10/2010   Qualifier: Diagnosis of   By: Kem Parkinson       Hair loss 09/17/2019   Heart failure (HCC) 04/20/2021   Heart murmur    Hepatitis C    History of cervical cancer 03/22/2011   History of gout X 1   History of tobacco use 04/20/2021   Hx of atrial flutter 03/15/2021   Hyperglycemia due to type 2 diabetes mellitus (HCC) 04/20/2021   Hyperlipidemia    Hypertension    Hypertension associated with diabetes (HCC) 08/25/2018   Irritable bowel syndrome with constipation 04/20/2021   Irritable bowel syndrome with diarrhea 06/18/2019   Limitation of activity due to disability 09/18/2021   Lymphedema of both lower extremities 03/22/2011   Metabolic alkalosis 03/28/2021   Microalbuminuria 04/20/2021   Migraine    "monthly" (12/05/2017)   Mild tricuspid regurgitation 04/02/2021   Mitral valve sclerosis 04/02/2021   Mixed hyperlipidemia 04/18/2020   Moderate aortic stenosis 04/20/2021   Mononeuropathy of lower extremity 04/09/2011   Formatting of this note might be different from the original.  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  Severe, corrected by CPAP 18 with C-flex of 3  2/13 bipap 20/14 sm ff mask h/h biflex+3cm  Formatting of this note might be different from the original.  Sleep study 06/2020: IMPRESSIONS  - Moderate obstructive sleep apnea occurred during this study (AHI = 17.7/h), mostly REM related  - No significant central sleep apnea occurred during   On home oxygen therapy    "2L all the time" (12/05/2017)   On supplemental oxygen by nasal cannula 04/20/2021   Onychomycosis 06/18/2019   OSA treated with BiPAP    "have CPAP at home too; wearing  BiPAP right now" (12/05/2017)   Osteoarthritis 11/28/2010   Qualifier: Diagnosis of   By: Huntley Dec, Scott       Paroxysmal atrial fibrillation (HCC) 09/04/2021   Physical debility 05/10/2018   Formatting of this note might be different from the original.  Due to morbid obesity   Physical deconditioning 04/24/2021   Pneumonia    "several times" (12/05/2017)   Pneumonia due to gram-positive bacteria 08/24/2017   Pressure injury of both heels, unstageable (HCC) 07/03/2021   Pulmonary edema, acute (HCC) 02/21/2022   Renal insufficiency 04/20/2021   Restless leg syndrome 03/22/2021   Restrictive lung disease secondary to obesity 01/28/2022   S/P hysterectomy 02/19/2021   Tinea pedis of both feet 11/12/2018   Upper GI bleed 07/18/2022   Venous insufficiency of both lower extremities 07/02/2021   Weakness 03/20/2021    Weight loss 03/15/2021   Past Surgical History:  Procedure Laterality Date   ABDOMINAL SURGERY  2006 X 2   "TAH incision was infected"   BIOPSY  07/21/2022   Procedure: BIOPSY;  Surgeon: Kathi Der, MD;  Location: [facility];  Service: Gastroenterology;;   CHOLECYSTECTOMY OPEN     ESOPHAGOGASTRODUODENOSCOPY N/A 07/21/2022   Procedure: ESOPHAGOGASTRODUODENOSCOPY (EGD);  Surgeon: Kathi Der, MD;  Location: [facility];  Service: Gastroenterology;  Laterality: N/A;   TONSILLECTOMY     TOTAL ABDOMINAL HYSTERECTOMY  2006   TRACHEOSTOMY REVISION N/A 04/09/2022   Procedure: CONTROL OF BLEEDING,TRACHEOSTOMY SITE;  Surgeon: Christia Reading, MD;  Location: [facility] OR;  Service: ENT;  Laterality: N/A;     A IV Location/Drains/Wounds Patient Lines/Drains/Airways Status     Active Line/Drains/Airways     Name Placement date Placement time Site Days   Peripheral IV 05/28/23 20 G Posterior;Right Hand 05/28/23  1309  Hand  less than 1   Peripheral IV 05/28/23 18 G 1.75" Right Forearm 05/28/23  1952  Forearm  less than 1   External Urinary Catheter 05/28/23  1700  --  less than 1   Pressure Injury 04/12/22 Heel Left Deep Tissue Pressure Injury - Purple or maroon localized area of discolored intact skin or blood-filled blister due to damage of underlying soft tissue from pressure and/or shear. Purple, circular wound to heel 04/12/22  2000  -- 411   Wound / Incision (Open or Dehisced) 04/07/22 Other (Comment) Sacrum Mid long and narrow open wound in the skin fold crack 04/07/22  2000  Sacrum  416            Intake/Output Last 24 hours  Intake/Output Summary (Last 24 hours) at 05/28/2023 2344 Last data filed at 05/28/2023 2102 Gross per 24 hour  Intake 268.94 ml  Output --  Net 268.94 ml    Labs/Imaging Results for orders placed or performed during the hospital encounter of 05/28/23 (from the past 48 hour(s))  Basic metabolic panel     Status: Abnormal   Collection Time:  05/28/23  1:45 PM  Result Value Ref Range   Sodium 131 (L) 135 - 145 mmol/L   Potassium 5.2 (H) 3.5 - 5.1 mmol/L   Chloride 87 (L) 98 - 111 mmol/L   CO2 29 22 - 32 mmol/L   Glucose, Bld 320 (H) 70 - 99 mg/dL    Comment: Glucose reference range applies only to samples taken after fasting for at least 8 hours.   BUN 32 (H) 6 - 20 mg/dL   Creatinine, Ser 4.54 (H) 0.44 - 1.00 mg/dL   Calcium 8.6 (L) 8.9 - 10.3 mg/dL  GFR, Estimated 45 (L) >60 mL/min    Comment: (NOTE) Calculated using the CKD-EPI Creatinine Equation (2021)    Anion gap 15 5 - 15    Comment: ELECTROLYTES REPEATED TO VERIFY Performed at [facility], [address], [place], [place] [address]   Troponin I (High Sensitivity)     Status: None   Collection Time: 05/28/23  1:45 PM  Result Value Ref Range   Troponin I (High Sensitivity) 11 <18 ng/L    Comment: (NOTE) Elevated high sensitivity troponin I (hsTnI) values and significant  changes across serial measurements may suggest ACS but many other  chronic and acute conditions are known to elevate hsTnI results.  Refer to the "Links" section for chest pain algorithms and additional  guidance. Performed at [facility], [address], [place], [place] [address]   Magnesium     Status: None   Collection Time: 05/28/23  1:45 PM  Result Value Ref Range   Magnesium 2.3 1.7 - 2.4 mg/dL    Comment: Performed at [facility], [address], [place], [place] [address]  Lipase, blood     Status: None   Collection Time: 05/28/23  1:45 PM  Result Value Ref Range   Lipase 20 11 - 51 U/L    Comment: Performed at [facility] Lab, [address] [place], [place] [address]  Hepatic function panel     Status: Abnormal   Collection Time: 05/28/23  1:45 PM  Result Value Ref Range   Total Protein 7.0 6.5 - 8.1 g/dL   Albumin 2.9 (L) 3.5 - 5.0 g/dL   AST 25 15 - 41 U/L   ALT 14 0 - 44 U/L   Alkaline Phosphatase 113 38 - 126 U/L   Total Bilirubin 1.0 0.3 -  1.2 mg/dL   Bilirubin, Direct 0.2 0.0 - 0.2 mg/dL   Indirect Bilirubin 0.8 0.3 - 0.9 mg/dL    Comment: Performed at [facility], [address] [place], [place] [address]  CBC with Differential/Platelet     Status: Abnormal   Collection Time: 05/28/23  1:45 PM  Result Value Ref Range   WBC 14.5 (H) 4.0 - 10.5 K/uL   RBC 3.60 (L) 3.87 - 5.11 MIL/uL   Hemoglobin 11.0 (L) 12.0 - 15.0 g/dL   HCT 32.4 40.1 - 02.7 %   MCV 100.8 (H) 80.0 - 100.0 fL   MCH 30.6 26.0 - 34.0 pg   MCHC 30.3 30.0 - 36.0 g/dL   RDW 25.3 (H) 66.4 - 40.3 %   Platelets 340 150 - 400 K/uL   nRBC 0.8 (H) 0.0 - 0.2 %   Neutrophils Relative % 85 %   Neutro Abs 12.5 (H) 1.7 - 7.7 K/uL   Lymphocytes Relative 9 %   Lymphs Abs 1.3 0.7 - 4.0 K/uL   Monocytes Relative 4 %   Monocytes Absolute 0.6 0.1 - 1.0 K/uL   Eosinophils Relative 0 %   Eosinophils Absolute 0.0 0.0 - 0.5 K/uL   Basophils Relative 1 %   Basophils Absolute 0.1 0.0 - 0.1 K/uL   Immature Granulocytes 1 %   Abs Immature Granulocytes 0.13 (H) 0.00 - 0.07 K/uL    Comment: Performed at [facility], [address] [place], [place] [address]  Urinalysis, Routine w reflex microscopic -Urine, Clean Catch     Status: Abnormal   Collection Time: 05/28/23  1:45 PM  Result Value Ref Range  Color, Urine YELLOW YELLOW   APPearance HAZY (A) CLEAR   Specific Gravity, Urine 1.020 1.005 - 1.030   pH 6.0 5.0 - 8.0   Glucose, UA >=500 (A) NEGATIVE mg/dL   Hgb urine dipstick NEGATIVE NEGATIVE   Bilirubin Urine NEGATIVE NEGATIVE   Ketones, ur NEGATIVE NEGATIVE mg/dL   Protein, ur NEGATIVE NEGATIVE mg/dL   Nitrite NEGATIVE NEGATIVE   Leukocytes,Ua SMALL (A) NEGATIVE    Comment: Performed at [facility], [address] [place], [place] [address]  Brain natriuretic peptide     Status: Abnormal   Collection Time: 05/28/23  1:45 PM  Result Value Ref Range   B Natriuretic Peptide 721.2 (H) 0.0 - 100.0 pg/mL    Comment: Performed at [name], [address], [place], [place] [address]  Urinalysis, Microscopic (reflex)     Status: Abnormal   Collection Time: 05/28/23  1:45 PM  Result Value Ref Range   RBC / HPF NONE SEEN 0 - 5 RBC/hpf   WBC, UA 0-5 0 - 5 WBC/hpf   Bacteria, UA RARE (A) NONE SEEN   Squamous Epithelial / HPF 0-5 0 - 5 /HPF    Comment: Performed at [facility], [address] [place], [place] [address]  I-stat chem 8, ED     Status: Abnormal   Collection Time: 05/28/23  2:04 PM  Result Value Ref Range   Sodium 130 (L) 135 - 145 mmol/L   Potassium 5.0 3.5 - 5.1 mmol/L   Chloride 90 (L) 98 - 111 mmol/L   BUN 39 (H) 6 - 20 mg/dL   Creatinine, Ser 7.32 (H) 0.44 - 1.00 mg/dL   Glucose, Bld 202 (H) 70 - 99 mg/dL    Comment: Glucose reference range applies only to samples taken after fasting for at least 8 hours.   Calcium, Ion 0.97 (L) 1.15 - 1.40 mmol/L   TCO2 34 (H) 22 - 32 mmol/L   Hemoglobin 12.6 12.0 - 15.0 g/dL   HCT 54.2 70.6 - 23.7 %  Troponin I (High Sensitivity)     Status: None   Collection Time: 05/28/23  6:16 PM  Result Value Ref Range   Troponin I (High Sensitivity) 16 <18 ng/L    Comment: (NOTE) Elevated high sensitivity troponin I (hsTnI) values and significant  changes across serial measurements may suggest ACS but many other  chronic and acute conditions are known to elevate hsTnI results.  Refer to the "Links" section for chest pain algorithms and additional  guidance. Performed at [facility], [address] [place], [place] [address]   Blood gas, venous     Status: Abnormal   Collection Time: 05/28/23  7:53 PM  Result Value Ref Range   pH, Ven 7.31 7.25 - 7.43   pCO2, Ven 72 (HH) 44 - 60 mmHg    Comment: CRITICAL RESULT CALLED TO, READ BACK BY AND VERIFIED WITH: NOTIFIED Heloise Purpura, RN @ 2017 05/28/23 BY SEKDAHL    pO2, Ven 42 32 - 45 mmHg   Bicarbonate 36.3 (H) 20.0 - 28.0 mmol/L   Acid-Base Excess 7.3 (H) 0.0 - 2.0 mmol/L   O2 Saturation 68.4 %    Patient temperature 37.0    Drawn by DRAWN BY RN     Comment: Performed at [facility] Lab, [address] [place], [place] [address]  HIV Antibody (routine testing w rflx)     Status: None   Collection Time: 05/28/23  7:55 PM  Result Value Ref Range   HIV Screen 4th Generation wRfx Non Reactive Non Reactive    Comment: Performed at [facility] Lab, [address], [place], [place] [address]  TSH     Status: None   Collection Time: 05/28/23  7:55 PM  Result Value Ref Range   TSH 0.625 0.350 - 4.500 uIU/mL    Comment: Performed by a 3rd Generation assay with a functional sensitivity of <=0.01 uIU/mL. Performed at [facility] Lab, [address] [place], [place] [address]    DG Chest Portable 1 View  Result Date: 05/28/2023 CLINICAL DATA:  Chest pain since last night. History of atrial fibrillation and hypertension. Diabetes. EXAM: PORTABLE CHEST 1 VIEW COMPARISON:  12/28/2022 FINDINGS: Shallow inspiration. Diffuse cardiac enlargement. Bilateral perihilar infiltrates, increasing since previous study. These may represent multifocal pneumonia or edema. No pleural effusions. No pneumothorax. Mediastinal contours appear intact. Calcified and tortuous aorta. IMPRESSION: Cardiac enlargement with increasing bilateral pulmonary infiltrates. Electronically Signed   By: Burman Nieves M.D.   On: 05/28/2023 15:41    Pending Labs Unresulted Labs (From admission, onward)     Start     Ordered   05/29/23 0500  Basic metabolic panel  Tomorrow morning,   R        05/28/23 1815   05/29/23 0500  CBC  Tomorrow morning,   R        05/28/23 1815            Vitals/Pain Today's Vitals   05/28/23 2215 05/28/23 2232 05/28/23 2253 05/28/23 2324  BP: (!) 121/95 99/81 (!) 110/95   Pulse: (!) 141 (!) 139 (!) 140 (!) 141  Resp: 19 (!) 30  18  Temp: 98.5 F (36.9 C)     TempSrc: Oral     SpO2: 92% 100% 97% 97%    Isolation Precautions No active isolations  Medications Medications   amiodarone (NEXTERONE) 1.8 mg/mL load via infusion 150 mg (150 mg Intravenous Bolus from Bag 05/28/23 1459)    Followed by  amiodarone (NEXTERONE PREMIX) 360-4.14 MG/200ML-% (1.8 mg/mL) IV infusion (0 mg/hr Intravenous Stopped 05/28/23 2057)    Followed by  amiodarone (NEXTERONE PREMIX) 360-4.14 MG/200ML-% (1.8 mg/mL) IV infusion (30 mg/hr Intravenous Rate/Dose Verify 05/28/23 2251)  furosemide (LASIX) injection 80 mg (80 mg Intravenous Given 05/28/23 1805)  apixaban (ELIQUIS) tablet 5 mg (5 mg Oral Given 05/28/23 2219)  gabapentin (NEURONTIN) capsule 400 mg (400 mg Oral Given 05/28/23 2219)  budesonide (PULMICORT) nebulizer solution 0.5 mg (0.5 mg Inhalation Given 05/28/23 2010)  oxyCODONE-acetaminophen (PERCOCET/ROXICET) 5-325 MG per tablet 1 tablet (1 tablet Oral Given 05/28/23 2309)    And  oxyCODONE (Oxy IR/ROXICODONE) immediate release tablet 5 mg (5 mg Oral Given 05/28/23 2310)  metoprolol tartrate (LOPRESSOR) injection 5 mg (5 mg Intravenous Given 05/28/23 1445)  furosemide (LASIX) injection 40 mg (40 mg Intravenous Given 05/28/23 1444)  digoxin (LANOXIN) 0.25 MG/ML injection 0.25 mg (0.25 mg Intravenous Given 05/28/23 1712)    Mobility Walks with assist     Focused Assessments Cardiac Assessment Handoff:    Lab Results  Component Value Date   CKTOTAL 564 (H) 12/08/2010   CKMB (HH) 12/08/2010    6.6 CRITICAL VALUE NOTED.  VALUE IS CONSISTENT WITH PREVIOUSLY REPORTED AND CALLED VALUE.   TROPONINI 0.05        NO INDICATION OF MYOCARDIAL INJURY. 12/08/2010   Lab Results  Component Value Date   DDIMER 1.06 (H) 07/29/2022   Does  the Patient currently have chest pain? No    R Recommendations: See Admitting Provider Note  Report given to:   Additional Notes: Amiodarone at 30mg /hr infusing; pt remains tachy at 140s; currently on bipap tolerating well

## 2023-05-29 ENCOUNTER — Inpatient Hospital Stay (HOSPITAL_COMMUNITY): Payer: Medicare Other

## 2023-05-29 DIAGNOSIS — I4892 Unspecified atrial flutter: Principal | ICD-10-CM

## 2023-05-29 DIAGNOSIS — N179 Acute kidney failure, unspecified: Secondary | ICD-10-CM

## 2023-05-29 HISTORY — DX: Acute kidney failure, unspecified: N17.9

## 2023-05-29 LAB — HEMOGLOBIN A1C
Hgb A1c MFr Bld: 8.5 % — ABNORMAL HIGH (ref 4.8–5.6)
Mean Plasma Glucose: 197.25 mg/dL

## 2023-05-29 LAB — COMPREHENSIVE METABOLIC PANEL
ALT: 14 U/L (ref 0–44)
AST: 17 U/L (ref 15–41)
Albumin: 2.9 g/dL — ABNORMAL LOW (ref 3.5–5.0)
Alkaline Phosphatase: 101 U/L (ref 38–126)
Anion gap: 15 (ref 5–15)
BUN: 38 mg/dL — ABNORMAL HIGH (ref 6–20)
CO2: 28 mmol/L (ref 22–32)
Calcium: 8.1 mg/dL — ABNORMAL LOW (ref 8.9–10.3)
Chloride: 88 mmol/L — ABNORMAL LOW (ref 98–111)
Creatinine, Ser: 1.11 mg/dL — ABNORMAL HIGH (ref 0.44–1.00)
GFR, Estimated: 57 mL/min — ABNORMAL LOW (ref >60)
Glucose, Bld: 294 mg/dL — ABNORMAL HIGH (ref 70–99)
Potassium: 5.2 mmol/L — ABNORMAL HIGH (ref 3.5–5.1)
Sodium: 131 mmol/L — ABNORMAL LOW (ref 135–145)
Total Bilirubin: 0.3 mg/dL (ref 0.3–1.2)
Total Protein: 6.9 g/dL (ref 6.5–8.1)

## 2023-05-29 LAB — CBC
HCT: 33.4 % — ABNORMAL LOW (ref 36.0–46.0)
Hemoglobin: 10.2 g/dL — ABNORMAL LOW (ref 12.0–15.0)
MCH: 29.7 pg (ref 26.0–34.0)
MCHC: 30.5 g/dL (ref 30.0–36.0)
MCV: 97.1 fL (ref 80.0–100.0)
Platelets: 298 10*3/uL (ref 150–400)
RBC: 3.44 MIL/uL — ABNORMAL LOW (ref 3.87–5.11)
RDW: 17.8 % — ABNORMAL HIGH (ref 11.5–15.5)
WBC: 10.8 10*3/uL — ABNORMAL HIGH (ref 4.0–10.5)
nRBC: 0.3 % — ABNORMAL HIGH (ref 0.0–0.2)

## 2023-05-29 LAB — GLUCOSE, CAPILLARY
Glucose-Capillary: 313 mg/dL — ABNORMAL HIGH (ref 70–99)
Glucose-Capillary: 286 mg/dL — ABNORMAL HIGH (ref 70–99)
Glucose-Capillary: 283 mg/dL — ABNORMAL HIGH (ref 70–99)
Glucose-Capillary: 194 mg/dL — ABNORMAL HIGH (ref 70–99)

## 2023-05-29 LAB — BLOOD GAS, VENOUS
Acid-Base Excess: 11.5 mmol/L — ABNORMAL HIGH (ref 0.0–2.0)
Acid-Base Excess: 0.7 mmol/L (ref 0.0–2.0)
Bicarbonate: 41.2 mmol/L — ABNORMAL HIGH (ref 20.0–28.0)
Bicarbonate: 30 mmol/L — ABNORMAL HIGH (ref 20.0–28.0)
Drawn by: 60076
Drawn by: 7514
O2 Saturation: 72 %
O2 Saturation: 47.8 %
Patient temperature: 36.5
Patient temperature: 36.5
pCO2, Ven: 78 mmHg (ref 44–60)
pCO2, Ven: 68 mmHg — ABNORMAL HIGH (ref 44–60)
pH, Ven: 7.33 (ref 7.25–7.43)
pH, Ven: 7.25 (ref 7.25–7.43)
pO2, Ven: 44 mmHg (ref 32–45)
pO2, Ven: 31 mmHg — CL (ref 32–45)

## 2023-05-29 MED ORDER — HYDROXYZINE HCL 25 MG PO TABS
25.0000 mg | ORAL_TABLET | Freq: Three times a day (TID) | ORAL | Status: DC | PRN
  Administered 2023-05-30 – 2023-06-07 (×4): 25 mg via ORAL
  Filled 2023-05-29 (×4): qty 1

## 2023-05-29 MED ORDER — INSULIN ASPART 100 UNIT/ML IJ SOLN
0.0000 [IU] | Freq: Three times a day (TID) | INTRAMUSCULAR | Status: DC
  Administered 2023-05-29: 11 [IU] via SUBCUTANEOUS
  Administered 2023-05-30 (×2): 4 [IU] via SUBCUTANEOUS
  Administered 2023-05-31: 7 [IU] via SUBCUTANEOUS
  Administered 2023-05-31: 3 [IU] via SUBCUTANEOUS
  Administered 2023-05-31: 4 [IU] via SUBCUTANEOUS
  Administered 2023-06-01: 3 [IU] via SUBCUTANEOUS
  Administered 2023-06-01: 4 [IU] via SUBCUTANEOUS
  Administered 2023-06-02: 3 [IU] via SUBCUTANEOUS
  Administered 2023-06-02 – 2023-06-03 (×3): 4 [IU] via SUBCUTANEOUS
  Administered 2023-06-03: 3 [IU] via SUBCUTANEOUS
  Administered 2023-06-03 – 2023-06-04 (×2): 4 [IU] via SUBCUTANEOUS
  Administered 2023-06-04: 3 [IU] via SUBCUTANEOUS
  Administered 2023-06-04 – 2023-06-05 (×3): 4 [IU] via SUBCUTANEOUS
  Administered 2023-06-05: 3 [IU] via SUBCUTANEOUS
  Administered 2023-06-06 – 2023-06-07 (×5): 4 [IU] via SUBCUTANEOUS
  Administered 2023-06-07: 3 [IU] via SUBCUTANEOUS
  Administered 2023-06-08 (×3): 4 [IU] via SUBCUTANEOUS

## 2023-05-29 MED ORDER — TECHNETIUM TO 99M ALBUMIN AGGREGATED
4.3000 | Freq: Once | INTRAVENOUS | Status: AC | PRN
  Administered 2023-05-29: 4.3 via INTRAVENOUS

## 2023-05-29 MED ORDER — LEVOTHYROXINE SODIUM 75 MCG PO TABS
150.0000 ug | ORAL_TABLET | Freq: Every day | ORAL | Status: DC
  Administered 2023-05-30 – 2023-06-08 (×9): 150 ug via ORAL
  Filled 2023-05-29 (×10): qty 2

## 2023-05-29 MED ORDER — DIGOXIN 0.25 MG/ML IJ SOLN
0.2500 mg | Freq: Once | INTRAMUSCULAR | Status: AC
  Administered 2023-05-29: 0.25 mg via INTRAVENOUS
  Filled 2023-05-29: qty 2

## 2023-05-29 MED ORDER — AMIODARONE IV BOLUS ONLY 150 MG/100ML
150.0000 mg | Freq: Once | INTRAVENOUS | Status: AC
  Administered 2023-05-29: 150 mg via INTRAVENOUS
  Filled 2023-05-29: qty 100

## 2023-05-29 MED ORDER — GABAPENTIN 100 MG PO CAPS
100.0000 mg | ORAL_CAPSULE | Freq: Three times a day (TID) | ORAL | Status: DC
  Administered 2023-05-29: 100 mg via ORAL
  Filled 2023-05-29: qty 1

## 2023-05-29 MED ORDER — OXYCODONE-ACETAMINOPHEN 5-325 MG PO TABS
1.0000 | ORAL_TABLET | Freq: Four times a day (QID) | ORAL | Status: DC | PRN
  Administered 2023-05-29 – 2023-06-08 (×16): 1 via ORAL
  Filled 2023-05-29 (×19): qty 1

## 2023-05-29 MED ORDER — OXYCODONE HCL 5 MG PO TABS: 5.0000 mg | ORAL_TABLET | Freq: Four times a day (QID) | ORAL | Status: DC | PRN

## 2023-05-29 MED ORDER — GABAPENTIN 100 MG PO CAPS
200.0000 mg | ORAL_CAPSULE | Freq: Three times a day (TID) | ORAL | Status: DC
  Administered 2023-05-29: 200 mg via ORAL
  Filled 2023-05-29: qty 2

## 2023-05-29 MED ORDER — HYDROXYZINE HCL 25 MG PO TABS: 25.0000 mg | ORAL_TABLET | Freq: Three times a day (TID) | ORAL | Status: DC | PRN

## 2023-05-29 MED ORDER — LEVOTHYROXINE SODIUM 75 MCG PO TABS
150.0000 ug | ORAL_TABLET | Freq: Every day | ORAL | Status: DC
  Filled 2023-05-29: qty 2

## 2023-05-29 MED ORDER — INSULIN ASPART 100 UNIT/ML IJ SOLN
0.0000 [IU] | Freq: Three times a day (TID) | INTRAMUSCULAR | Status: DC
  Administered 2023-05-29: 11 [IU] via SUBCUTANEOUS

## 2023-05-29 MED ORDER — ATORVASTATIN CALCIUM 10 MG PO TABS
20.0000 mg | ORAL_TABLET | Freq: Every evening | ORAL | Status: DC
  Administered 2023-05-29 – 2023-06-07 (×10): 20 mg via ORAL
  Filled 2023-05-29 (×11): qty 2

## 2023-05-29 NOTE — Assessment & Plan Note (Addendum)
Suspect this to be main contributing factor to patient's shortness of breath, probably exacerbated by tachycardia - BNP 721 initially, chest x-ray suspicious for pulmonary edema - Cardiology following, appreciate recs - Strict I's and O's.  No output recorded since admission, however do not believe that it has been monitored.  If patient still does not have output, consider bladder scan - Will plan for echo once heart rate comes down.  Consider DCCV later in week if heart rate remains elevated. - Lasix 80 mg IV twice daily

## 2023-05-29 NOTE — Assessment & Plan Note (Addendum)
Likely multifactorial.   - ABG this morning - NM perfusion scan to evaluate for PE - Patient requires 5L at home  - Placed on BiPAP overnight, was on it and satting 95% when I evaluated her. Now on HFNC 10 L at 95% - Could consider redosing steroids pending patient's clinical course - Has history of trach placed in July 2023, can consult CCM for respiratory support if condition changes

## 2023-05-29 NOTE — Assessment & Plan Note (Addendum)
Fasting BGL this morning 313, 11 units novolog given. Pt received solumedrol in the ED, suspect this contributing to elevated BGLs - currently moderate sliding scale, will switch to resistant sliding scale - home jardiance 10mg  daily, trulicity, holding for now - continue CBG monitoring before meals and at bedtime for now

## 2023-05-29 NOTE — Progress Notes (Addendum)
Pt heart rate sustaining in the 140s to 150s. MD, Dr. Marisue Humble, at bedside to see patient. Discussed the HR sustaining on Amio gtt and was told patient usually stays around this rate and will probably not convert overnight. Continue to monitor. Patient is a yellow MEWS secondary to HR, but MD aware. Venous blood gas discussed with RT, not concerned, but will check on patient.   Carma Lair, DNP PCCN RN

## 2023-05-29 NOTE — Assessment & Plan Note (Addendum)
Cr 1.36 > 1.5 on admission. Normal at baseline - Suspect secondary to decompensated heart failure - try to avoid nephrotoxic agents

## 2023-05-29 NOTE — Plan of Care (Signed)
  Problem: Education: Goal: Ability to describe self-care measures that may prevent or decrease complications (Diabetes Survival Skills Education) will improve Outcome: Progressing Goal: Individualized Educational Video(s) Outcome: Progressing   Problem: Coping: Goal: Ability to adjust to condition or change in health will improve Outcome: Progressing   

## 2023-05-29 NOTE — Assessment & Plan Note (Signed)
-   pulmicort nebulizer BID - O2 goal 88-92%

## 2023-05-29 NOTE — Assessment & Plan Note (Addendum)
HR 141, appears sinus tach, has been 130s-140s throughout the night - Amiodarone loading dose x1 and digoxin x1 initially, currently on amiodarone drip - Cardiology following, appreciate recs - continue home eliquis 5mg . Holding home metoprolol

## 2023-05-29 NOTE — Progress Notes (Signed)
Heart Failure Navigator Progress Note  Assessed for Heart & Vascular TOC clinic readiness.  Patient does not meet criteria due to been bed bound at SNF.   Navigator will sign off at this time.    Chrisoula Zegarra,RN, BSN,MSN Heart Failure Nurse Navigator. Contact by secure chat only.

## 2023-05-29 NOTE — Progress Notes (Signed)
Pt transported from ED to 3E06 without complications.

## 2023-05-29 NOTE — Assessment & Plan Note (Signed)
-   Decrease gabapentin to 100mg  TID  - Percocet 5-325mg  Q6h PRN - Will attempt to med rec with facility

## 2023-05-29 NOTE — Progress Notes (Signed)
FMTS Brief Progress Note  S:Seen at bedside with Dr. Sharion Dove. Heart rate sustaining ~150. Appears to be in 2:1 flutter on the monitor. Patient tells me she feels a bit better tonight compared to last night. No new symptoms. No CP at present. Thinks she is urinating more with IV diuresis.  Requesting something for restless legs.  Care discussed with RN at bedside who only voices concern for her heart rate.   O: BP (!) 148/114 (BP Location: Left Wrist)   Pulse (!) 144   Temp 98.4 F (36.9 C) (Axillary)   Resp 19   SpO2 96%   Gen: Obese, wearing BiPAP, conversant and NAD Cardio: Tachycardic, regular, heart sounds difficult to auscultate 2/2 body habitus and BiPAP but ?systolic murmur that I did not appreciate yesterday.  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 Mentation seems clear at present. Repeat VBG pending.  - Remains on Amio gtt per cardiology. I see in Dr. Charlott Rakes note mention of another digoxin dose today, this has not been ordered or administered, will order now.  - Continue IV Lasix 80mg  BID  - BP remains acceptable - Need to be mindful that she is at risk for decompensation from tachycardia-mediated CM   Restless legs - Already on a gabapentinoid, just got first dose ~1600 with no effect. Will increase bedtime dose to 200mg  and see if that helps.  - Would consider iron infusion tomorrow to help further. Historically has been quite iron deficient. Iron studies added to AM labs.   - Orders reviewed. Labs for AM ordered, which was adjusted as needed.  - If condition changes, plan includes CCM consult for respiratory support. Alicia Amel, MD 05/29/2023, 7:57 PM PGY-3, [facility] Night  Resident  Please page [phone] with questions.

## 2023-05-29 NOTE — Progress Notes (Signed)
Daily Progress Note Intern Pager: [phone]  Patient name: Chelsea [name] Medical record number: [id number] Date of birth: [birth year] Age: 57 y.o. Gender: female  Primary Care Provider: Pcp, No Consultants: cardiology Code Status: FULL CODE  Pt Overview and Major Events to Date:  8/27 - admitted  Assessment and Plan: 57 year old female with past medical history of chronic respiratory failure requiring 5 L oxygen at home, COPD, HFpEF, PAF, tracheostomy (reverted in 2023) presenting initially for tachycardia, increased shortness of breath, intermittent chest pain.  Patient has had multiple admissions for similar symptoms. Suspect this is multifactorial. Differentials include A flutter with RVR potentially leading to CHF exacerbation, COPD exacerbation, PE.  Patient's chest x-ray with known cardiomegaly and pulmonary congestion.  Patient continues to be tachycardic in the 140s.  Less suspicious for COPD exacerbation as patient is not wheezing or coughing, although exam is limited by body habitus.  Will obtain VQ scan to rule out PE today.  Cardiology will continue to follow, will try to obtain echo when patient's heart rate comes down.  Will plan to contact patient's LTC for collateral information and assistance with med rec. Assessment & Plan Atrial flutter with rapid ventricular response (HCC) HR 141, appears sinus tach, has been 130s-140s throughout the night - Amiodarone loading dose x1 and digoxin x1 initially, currently on amiodarone drip - Cardiology following, appreciate recs - continue home eliquis 5mg . Holding home metoprolol Acute on chronic diastolic CHF (congestive heart failure), NYHA class 3 (HCC) Suspect this to be main contributing factor to patient's shortness of breath, probably exacerbated by tachycardia - BNP 721 initially, chest x-ray suspicious for pulmonary edema - Cardiology following, appreciate recs - Strict I's and O's.  No output recorded since  admission, however do not believe that it has been monitored.  If patient still does not have output, consider bladder scan - Will plan for echo once heart rate comes down.  Consider DCCV later in week if heart rate remains elevated. - Lasix 80 mg IV twice daily Acute on chronic respiratory failure with hypoxia (HCC) Likely multifactorial.   - ABG this morning - NM perfusion scan to evaluate for PE - Patient requires 5L at home  - Placed on BiPAP overnight, was on it and satting 95% when I evaluated her. Now on HFNC 10 L at 95% - Could consider redosing steroids pending patient's clinical course - Has history of trach placed in July 2023, can consult CCM for respiratory support if condition changes Acute kidney injury (HCC) Cr 1.36 > 1.5 on admission. Normal at baseline - Suspect secondary to decompensated heart failure - try to avoid nephrotoxic agents  DM2 (diabetes mellitus, type 2) (HCC) Fasting BGL this morning 313, 11 units novolog given. Pt received solumedrol in the ED, suspect this contributing to elevated BGLs - currently moderate sliding scale, will switch to resistant sliding scale - home jardiance 10mg  daily, trulicity, holding for now - continue CBG monitoring before meals and at bedtime for now COPD (chronic obstructive pulmonary disease) with chronic bronchitis - pulmicort nebulizer BID - O2 goal 88-92% Chronic pain of multiple joints - Decrease gabapentin to 100mg  TID  - Percocet 5-325mg  Q6h PRN - Will attempt to med rec with facility  Chronic and Stable Problems:  Shingles two weeks ago  Hyperthyroidism - levothyroxine daily HTN - torsemide 40mg  bid, spironolactone 25mg  daily at home  HLD - atorvastatin 20mg  every day OSA  FEN/GI: NPO sips with meds PPx: eliquis 5mg  Dispo: pending clinical  improvement .  Subjective:  Very sleepy.  States that she feels like she is breathing better with the BiPAP.  Objective: Temp:  [97.7 F (36.5 C)-98.5 F (36.9  C)] 98.4 F (36.9 C) (08/28 0755) Pulse Rate:  [58-145] 141 (08/28 0328) Resp:  [14-30] 17 (08/28 0755) BP: (97-121)/(64-95) 114/81 (08/28 0755) SpO2:  [89 %-100 %] 95 % (08/28 0915) FiO2 (%):  [50 %] 50 % (08/28 0443) Physical Exam: General: morbidly obese, chronically ill appearing, resting comfortably on BIPAP, very sleepy Cardiovascular: tachycardic, sounds regular Respiratory: normal WOB on bipap, unable to fully appreciate lung sounds 2/2 body habitus however no wheezes noted Abdomen: soft, non tender Extremities: BLE with what appears to be chronic lymphedema with overlying skin changes, no pitting noted  Laboratory: Most recent CBC Lab Results  Component Value Date   WBC 14.5 (H) 05/28/2023   HGB 12.6 05/28/2023   HCT 37.0 05/28/2023   MCV 100.8 (H) 05/28/2023   PLT 340 05/28/2023   Most recent BMP    Latest Ref Rng & Units 05/28/2023    2:04 PM  BMP  Glucose 70 - 99 mg/dL 540   BUN 6 - 20 mg/dL 39   Creatinine 9.81 - 1.00 mg/dL 1.91   Sodium 478 - 295 mmol/L 130   Potassium 3.5 - 5.1 mmol/L 5.0   Chloride 98 - 111 mmol/L 90    Other pertinent labs: Trop 16 in ED TSH 0.625 WNL in ED Initial VBG on admission pH 7.31, bicarb 36.3   Imaging/Diagnostic Tests: Initial CXR IMPRESSION: Cardiac enlargement with increasing bilateral pulmonary infiltrates.  Chelsea Sigg, DO 05/29/2023, 10:54 AM  PGY-1, [facility] FPTS Intern pager: [phone], text pages welcome Secure chat group [facility] Teaching Service

## 2023-05-29 NOTE — Progress Notes (Signed)
   05/29/23 0359  Vitals  Temp 98.4 F (36.9 C)  Temp Source Axillary  BP 108/69  MAP (mmHg) 77  BP Location Left Arm  BP Method Automatic  Patient Position (if appropriate) Lying  Pulse Rate Source Monitor  ECG Heart Rate (!) 141  Resp 14  Level of Consciousness  Level of Consciousness Alert  MEWS COLOR  MEWS Score Color Yellow  Oxygen Therapy  SpO2 95 %  O2 Device Bi-PAP  MEWS Score  MEWS Temp 0  MEWS Systolic 0  MEWS Pulse 3  MEWS RR 0  MEWS LOC 0  MEWS Score 3   Patient comes in Alert and Oriented on Bipap. Patient is in Aflutter with heart rate up in the 140's and is current on Amiodarone. MD is present in the room, ordered Atarax for anxiety. Patient has oxycodone scheduled. Patient is in [facility], MD aware.

## 2023-05-29 NOTE — Progress Notes (Signed)
Rounding Note    Patient Name: Chelsea [name] Date of Encounter: 05/29/2023  [facility] Cardiologist: Donato Schultz, MD   Subjective   Pt appears comfortable on BiPAP   Resting   Inpatient Medications    Scheduled Meds:  apixaban  5 mg Oral BID   budesonide  0.5 mg Inhalation BID   furosemide  80 mg Intravenous BID   gabapentin  400 mg Oral TID   insulin aspart  0-15 Units Subcutaneous TID WC   oxyCODONE-acetaminophen  1 tablet Oral Q6H   And   oxyCODONE  5 mg Oral Q6H   Continuous Infusions:  amiodarone 30 mg/hr (05/29/23 0423)   PRN Meds: hydrOXYzine   Vital Signs    Vitals:   05/29/23 0328 05/29/23 0359 05/29/23 0755 05/29/23 0915  BP: 105/80 108/69 114/81   Pulse: (!) 141     Resp: 20 14 17    Temp:  98.4 F (36.9 C) 98.4 F (36.9 C)   TempSrc:  Axillary Axillary   SpO2: 94% 95% 98% 95%    Intake/Output Summary (Last 24 hours) at 05/29/2023 0939 Last data filed at 05/28/2023 2102 Gross per 24 hour  Intake 268.94 ml  Output --  Net 268.94 ml      12/31/2022    4:11 AM 12/30/2022    1:00 AM 12/29/2022    5:00 AM  Last 3 Weights  Weight (lbs) 308 lb 3.3 oz 308 lb 6.8 oz 306 lb 14.1 oz  Weight (kg) 139.8 kg 139.9 kg 139.2 kg      Telemetry    Atrial flutter  130s  - Personally Reviewed  ECG    No new  - Personally Reviewed  Physical Exam   GEN: Morbidly obese 57 yo   ON Bipap    Neck: Neck full  Cardiac: RRR,  tachy Respiratory: Clear to auscultation anteriorly  GI:  OBese  MS: 1-2+ LE edema  Chronic skin changes  Labs    High Sensitivity Troponin:   Recent Labs  Lab 05/28/23 1345 05/28/23 1816  TROPONINIHS 11 16     Chemistry Recent Labs  Lab 05/28/23 1345 05/28/23 1404  NA 131* 130*  K 5.2* 5.0  CL 87* 90*  CO2 29  --   GLUCOSE 320* 331*  BUN 32* 39*  CREATININE 1.36* 1.50*  CALCIUM 8.6*  --   MG 2.3  --   PROT 7.0  --   ALBUMIN 2.9*  --   AST 25  --   ALT 14  --   ALKPHOS 113  --   BILITOT 1.0  --    GFRNONAA 45*  --   ANIONGAP 15  --     Lipids No results for input(s): "CHOL", "TRIG", "HDL", "LABVLDL", "LDLCALC", "CHOLHDL" in the last 168 hours.  Hematology Recent Labs  Lab 05/28/23 1345 05/28/23 1404  WBC 14.5*  --   RBC 3.60*  --   HGB 11.0* 12.6  HCT 36.3 37.0  MCV 100.8*  --   MCH 30.6  --   MCHC 30.3  --   RDW 17.9*  --   PLT 340  --    Thyroid  Recent Labs  Lab 05/28/23 1955  TSH 0.625    BNP Recent Labs  Lab 05/28/23 1345  BNP 721.2*    DDimer No results for input(s): "DDIMER" in the last 168 hours.   Radiology    DG Chest Portable 1 View  Result Date: 05/28/2023 CLINICAL DATA:  Chest pain  since last night. History of atrial fibrillation and hypertension. Diabetes. EXAM: PORTABLE CHEST 1 VIEW COMPARISON:  12/28/2022 FINDINGS: Shallow inspiration. Diffuse cardiac enlargement. Bilateral perihilar infiltrates, increasing since previous study. These may represent multifocal pneumonia or edema. No pleural effusions. No pneumothorax. Mediastinal contours appear intact. Calcified and tortuous aorta. IMPRESSION: Cardiac enlargement with increasing bilateral pulmonary infiltrates. Electronically Signed   By: Burman Nieves M.D.   On: 05/28/2023 15:41    Cardiac Studies   Echo 07/30/2022  1. Left ventricular ejection fraction, by estimation, is 60 to 65%. The  left ventricle has normal function. The left ventricle has no regional  wall motion abnormalities. There is mild left ventricular hypertrophy.  Left ventricular diastolic parameters  are consistent with Grade II diastolic dysfunction (pseudonormalization).  Elevated left ventricular end-diastolic pressure.   2. Right ventricular systolic function is normal. The right ventricular  size is mildly enlarged. Tricuspid regurgitation signal is inadequate for  assessing PA pressure.   3. Left atrial size was severely dilated.   4. The mitral valve is degenerative. Trivial mitral valve regurgitation.  No  evidence of mitral stenosis.   5. The aortic valve is abnormal. Unable to determine aortic valve  morphology due to image quality. There is moderate calcification of the  aortic valve. Aortic valve regurgitation is trivial. Moderate aortic valve  stenosis. Aortic valve area, by VTI  measures 1.16 cm. Aortic valve mean gradient measures 31.0 mmHg. Aortic  valve Vmax measures 3.58 m/s.   6. The inferior vena cava is dilated in size with >50% respiratory  variability, suggesting right atrial pressure of 8 mmHg.   7. Increased flow velocities may be secondary to anemia, thyrotoxicosis,  hyperdynamic or high flow state.   Patient Profile      Chelsea [name] is a 57 y.o. female with a hx of chronic respiratory failure on 5 L oxygen 24/7 secondary to OHS/OSA, she has a prior history of prior trach reverted in follow-up 2023, COPD, history of upper GI bleed 10/23, paroxysmal atrial fibrillation/atrial flutter, aortic stenosis and HFpEF who is being seen 05/28/2023 for the evaluation of aflutter with RVR and CHF at the request of Dr. Earlene Plater.   Assessment & Plan    1  Atrial flutter  Previously evaluated at [facility] in 2022  Mention consideration  for ablation at one point  No follow up Yesterday  Pt presented with resp failure in aflutter with RVR   Initally treated  With amiodarone and some digoxin  Not a great combination but long term amio not felt to be great option with lung issues REmains tachy on IV amiodarone   REcomm  Would give bolus and increase maintenance to 60 mg / hour Can give additional digoxin IV today  BP fair   WOuld not pursue DCCV at present as probably would revert to afib   Need to optimize volume    Keep on ELiquis  2  Hx HFpEF   Voume status is increased   Written for IV lasix   Strict I/O  3  Aortic stenosis   MOderate on echo in Oct 2023   Will need repeat eval once rates improved   4  Pulmonary   Pt appears comfortable on BiPAP  Resting       On 5L O2 at home    5  Renal   Na 130 yesteday  K 5    BUN/Cr 39/1.5   REepeat BMET  this am     For questions or  updates, please contact [facility] Please consult [email or web address] for contact info under        Signed, Dietrich Pates, MD  05/29/2023, 9:39 AM

## 2023-05-29 NOTE — Inpatient Diabetes Management (Signed)
Inpatient Diabetes Program Recommendations  AACE/ADA: New Consensus Statement on Inpatient Glycemic Control (2015)  Target Ranges:  Prepandial:   less than 140 mg/dL      Peak postprandial:   less than 180 mg/dL (1-2 hours)      Critically ill patients:  140 - 180 mg/dL   Lab Results  Component Value Date   GLUCAP 313 (H) 05/29/2023   HGBA1C 6.1 (H) 09/19/2022    Review of Glycemic Control  Latest Reference Range & Units 05/29/23 06:54  Glucose-Capillary 70 - 99 mg/dL 782 (H)  (H): Data is abnormally high  Diabetes history: DM2 Outpatient Diabetes medications:  Trulicity 0.75 mg weekly Fiasp 0-24 units TID Jardiance 10 mg QD Current orders for Inpatient glycemic control:  Novolog 0-15 units TID  Inpatient Diabetes Program Recommendations:    Please consider:  Semglee 20 units every day  Will continue to follow while inpatient.  Thank you, Dulce Sellar, MSN, CDCES Diabetes Coordinator Inpatient Diabetes Program [phone] (team pager from 8a-5p)

## 2023-05-29 NOTE — Progress Notes (Signed)
ABG sample unable to be obtained due to pt moving. Pt refusing second stick at this time. Dr. Rexene Alberts aware and verbal order received for VBG. Pt taken off bipap at this time and placed on 10L [place] without complication.

## 2023-05-29 NOTE — Progress Notes (Signed)
Pt transported on bipap from 3E06 to Nuclear Med and back without complication. RT, RN and transport accompanied pt.

## 2023-05-29 NOTE — Assessment & Plan Note (Signed)
Home meds: Metoprolol 25 mg daily   Cardiology following Dig x 1 Amiodarone loading dose x1 and currently on drip

## 2023-05-29 NOTE — Progress Notes (Signed)
Pt placed back on bipap at this time per MD request for continuous bipap. RT will continue to monitor and be available as needed.

## 2023-05-30 DIAGNOSIS — I4892 Unspecified atrial flutter: Secondary | ICD-10-CM | POA: Diagnosis not present

## 2023-05-30 DIAGNOSIS — I5033 Acute on chronic diastolic (congestive) heart failure: Secondary | ICD-10-CM | POA: Diagnosis not present

## 2023-05-30 DIAGNOSIS — F419 Anxiety disorder, unspecified: Secondary | ICD-10-CM | POA: Insufficient documentation

## 2023-05-30 LAB — COMPREHENSIVE METABOLIC PANEL
ALT: 12 U/L (ref 0–44)
AST: 13 U/L — ABNORMAL LOW (ref 15–41)
Albumin: 2.8 g/dL — ABNORMAL LOW (ref 3.5–5.0)
Alkaline Phosphatase: 84 U/L (ref 38–126)
Anion gap: 14 (ref 5–15)
BUN: 37 mg/dL — ABNORMAL HIGH (ref 6–20)
CO2: 33 mmol/L — ABNORMAL HIGH (ref 22–32)
Calcium: 8 mg/dL — ABNORMAL LOW (ref 8.9–10.3)
Chloride: 83 mmol/L — ABNORMAL LOW (ref 98–111)
Creatinine, Ser: 1.07 mg/dL — ABNORMAL HIGH (ref 0.44–1.00)
GFR, Estimated: 60 mL/min — ABNORMAL LOW (ref >60)
Glucose, Bld: 325 mg/dL — ABNORMAL HIGH (ref 70–99)
Potassium: 3.5 mmol/L (ref 3.5–5.1)
Sodium: 130 mmol/L — ABNORMAL LOW (ref 135–145)
Total Bilirubin: 0.7 mg/dL (ref 0.3–1.2)
Total Protein: 6.5 g/dL (ref 6.5–8.1)

## 2023-05-30 LAB — CBC
HCT: 31.8 % — ABNORMAL LOW (ref 36.0–46.0)
Hemoglobin: 9.6 g/dL — ABNORMAL LOW (ref 12.0–15.0)
MCH: 29.4 pg (ref 26.0–34.0)
MCHC: 30.2 g/dL (ref 30.0–36.0)
MCV: 97.2 fL (ref 80.0–100.0)
Platelets: 306 10*3/uL (ref 150–400)
RBC: 3.27 MIL/uL — ABNORMAL LOW (ref 3.87–5.11)
RDW: 17.7 % — ABNORMAL HIGH (ref 11.5–15.5)
WBC: 11.4 10*3/uL — ABNORMAL HIGH (ref 4.0–10.5)
nRBC: 0 % (ref 0.0–0.2)

## 2023-05-30 LAB — IRON AND TIBC
Iron: 13 ug/dL — ABNORMAL LOW (ref 28–170)
Saturation Ratios: 3 % — ABNORMAL LOW (ref 10.4–31.8)
TIBC: 392 ug/dL (ref 250–450)
UIBC: 379 ug/dL

## 2023-05-30 LAB — GLUCOSE, CAPILLARY
Glucose-Capillary: 166 mg/dL — ABNORMAL HIGH (ref 70–99)
Glucose-Capillary: 160 mg/dL — ABNORMAL HIGH (ref 70–99)
Glucose-Capillary: 172 mg/dL — ABNORMAL HIGH (ref 70–99)
Glucose-Capillary: 171 mg/dL — ABNORMAL HIGH (ref 70–99)
Glucose-Capillary: 126 mg/dL — ABNORMAL HIGH (ref 70–99)

## 2023-05-30 LAB — FERRITIN: Ferritin: 29 ng/mL (ref 11–307)

## 2023-05-30 LAB — MAGNESIUM: Magnesium: 2.4 mg/dL (ref 1.7–2.4)

## 2023-05-30 MED ORDER — FERROUS SULFATE 325 (65 FE) MG PO TABS
325.0000 mg | ORAL_TABLET | Freq: Every day | ORAL | Status: DC
  Administered 2023-05-30 – 2023-06-08 (×10): 325 mg via ORAL
  Filled 2023-05-30 (×10): qty 1

## 2023-05-30 MED ORDER — GABAPENTIN 300 MG PO CAPS
300.0000 mg | ORAL_CAPSULE | Freq: Three times a day (TID) | ORAL | Status: DC
  Administered 2023-05-30 – 2023-06-08 (×28): 300 mg via ORAL
  Filled 2023-05-30 (×30): qty 1

## 2023-05-30 MED ORDER — GABAPENTIN 100 MG PO CAPS
100.0000 mg | ORAL_CAPSULE | Freq: Three times a day (TID) | ORAL | Status: DC
  Filled 2023-05-30: qty 1

## 2023-05-30 MED ORDER — BUPROPION HCL ER (SR) 100 MG PO TB12
100.0000 mg | ORAL_TABLET | Freq: Two times a day (BID) | ORAL | Status: DC
  Administered 2023-05-30 – 2023-06-08 (×20): 100 mg via ORAL
  Filled 2023-05-30 (×21): qty 1

## 2023-05-30 MED ORDER — DIGOXIN 0.25 MG/ML IJ SOLN
0.2500 mg | Freq: Four times a day (QID) | INTRAMUSCULAR | Status: AC
  Administered 2023-05-30 (×2): 0.25 mg via INTRAVENOUS
  Filled 2023-05-30 (×2): qty 2

## 2023-05-30 MED ORDER — AMIODARONE LOAD VIA INFUSION
150.0000 mg | Freq: Once | INTRAVENOUS | Status: AC
  Administered 2023-05-30: 150 mg via INTRAVENOUS
  Filled 2023-05-30: qty 83.34

## 2023-05-30 MED ORDER — EMPAGLIFLOZIN 10 MG PO TABS
10.0000 mg | ORAL_TABLET | Freq: Every day | ORAL | Status: DC
  Administered 2023-05-30: 10 mg via ORAL
  Filled 2023-05-30: qty 1

## 2023-05-30 MED ORDER — SODIUM CHLORIDE 0.9 % IV SOLN
510.0000 mg | Freq: Once | INTRAVENOUS | Status: AC
  Administered 2023-05-30: 510 mg via INTRAVENOUS
  Filled 2023-05-30: qty 17

## 2023-05-30 MED ORDER — POTASSIUM CHLORIDE CRYS ER 20 MEQ PO TBCR
40.0000 meq | EXTENDED_RELEASE_TABLET | ORAL | Status: AC
  Administered 2023-05-30 (×2): 40 meq via ORAL
  Filled 2023-05-30 (×2): qty 2

## 2023-05-30 MED ORDER — SODIUM CHLORIDE 0.9 % IV SOLN: INTRAVENOUS | Status: DC

## 2023-05-30 MED ORDER — AMIODARONE IV BOLUS ONLY 150 MG/100ML
150.0000 mg | Freq: Once | INTRAVENOUS | Status: DC
Start: 2023-05-30 — End: 2023-05-30

## 2023-05-30 NOTE — Progress Notes (Addendum)
FMTS Brief Progress Note  S:Briefly out of A flutter and into A fib with RVR (rates 110s-20s) from 8p-midnight. Unfortunately now appears to be back in 2:1 flutter with rates in the high 140s. Sleeping comfortably with BiPAP on. Family member sleeping at bedside. BP with MAP 72.  Is scheduled for DCCV with Dr. Bjorn Pippin tomorrow.  Care discussed with RN who voices no other concerns at present.   O: BP 91/60 (BP Location: Left Arm)   Pulse (!) 148   Temp 99.8 F (37.7 C) (Axillary)   Resp 18   Wt (!) 156.7 kg   SpO2 94%   BMI 57.49 kg/m   Sleeping. Wearing BiPAP.   A/P: - Repeat EKG to confirm recurrent flutter as noted on tele - Appreciate cardiology's assistance with rate control in this difficult case. Rates would be easier to control if she were persistently in A fib than with her 2:1 flutter. Hopefully DCCV tomorrow will be successful in at least breaking flutter, if not getting her persistently into sinus rhythm. The likelihood of her holding sinus long is limited given her severe biatrial enlargement. Our options are present are limited by her borderline BP.  - Holding diuresis for now in the setting of borderline BPs.Now has foley placed for more accurate recording of I/Os. - BiPAP overnight - AM labs are ordered    ADDENDUM 0153 8/30 EKG obtained as above to confirm flutter actually now shows normal sinus rhythm, rate of 86 with some downsloping ST depression in V5-V6 that was present while she was in A fib with RVR earlier. Question whether this represents some rate-dependent LV strain. She's certainly at risk for a tachycardia-induced CM.  Grateful that she has converted, to be seen how durable this will be. - Continue amio gtt   Alicia Amel, MD 05/31/2023, 12:37 AM PGY-3, [facility] Night Resident  Please page [phone] with questions.

## 2023-05-30 NOTE — Progress Notes (Signed)
Rounding Note    Patient Name: Chelsea [name] Date of Encounter: 05/30/2023  [facility] Cardiologist: Donato Schultz, MD   Subjective   Pt on 7L O2  Apppears fairly comfortable   Inpatient Medications    Scheduled Meds:  apixaban  5 mg Oral BID   atorvastatin  20 mg Oral QPM   budesonide  0.5 mg Inhalation BID   buPROPion ER  100 mg Oral BID   ferrous sulfate  325 mg Oral Daily   furosemide  80 mg Intravenous BID   gabapentin  300 mg Oral TID   insulin aspart  0-20 Units Subcutaneous TID WC   levothyroxine  150 mcg Oral Q0600   Continuous Infusions:  amiodarone 60 mg/hr (05/30/23 0416)   PRN Meds: hydrOXYzine, oxyCODONE-acetaminophen **AND** [DISCONTINUED] oxyCODONE   Vital Signs    Vitals:   05/30/23 0528 05/30/23 0622 05/30/23 0835 05/30/23 0856  BP:   105/74   Pulse: (!) 143   (!) 142  Resp:   20   Temp:      TempSrc:      SpO2:  94% 90% 93%  Weight:        Intake/Output Summary (Last 24 hours) at 05/30/2023 1036 Last data filed at 05/29/2023 2300 Gross per 24 hour  Intake 397.16 ml  Output 600 ml  Net -202.84 ml      05/30/2023    4:54 AM 12/31/2022    4:11 AM 12/30/2022    1:00 AM  Last 3 Weights  Weight (lbs) 345 lb 7.4 oz 308 lb 3.3 oz 308 lb 6.8 oz  Weight (kg) 156.7 kg 139.8 kg 139.9 kg      Telemetry    Atrial flutter  130s  - Personally Reviewed  ECG    No new  - Personally Reviewed  Physical Exam   GEN: Morbidly obese 57 yo   ON Bipap    Neck: Neck full   Unable to assess JVP   Cardiac: RRR,  tachy Respiratory: Clear to auscultation anterior  GI:  OBese  MS: 2+ LE edema  Chronic skin changes  Labs    High Sensitivity Troponin:   Recent Labs  Lab 05/28/23 1345 05/28/23 1816  TROPONINIHS 11 16     Chemistry Recent Labs  Lab 05/28/23 1345 05/28/23 1404 05/29/23 1145 05/30/23 0340  NA 131* 130* 131* 130*  K 5.2* 5.0 5.2* 3.5  CL 87* 90* 88* 83*  CO2 29  --  28 33*  GLUCOSE 320* 331* 294* 325*  BUN  32* 39* 38* 37*  CREATININE 1.36* 1.50* 1.11* 1.07*  CALCIUM 8.6*  --  8.1* 8.0*  MG 2.3  --   --  2.4  PROT 7.0  --  6.9 6.5  ALBUMIN 2.9*  --  2.9* 2.8*  AST 25  --  17 13*  ALT 14  --  14 12  ALKPHOS 113  --  101 84  BILITOT 1.0  --  0.3 0.7  GFRNONAA 45*  --  57* 60*  ANIONGAP 15  --  15 14    Lipids No results for input(s): "CHOL", "TRIG", "HDL", "LABVLDL", "LDLCALC", "CHOLHDL" in the last 168 hours.  Hematology Recent Labs  Lab 05/28/23 1345 05/28/23 1404 05/29/23 1143 05/30/23 0340  WBC 14.5*  --  10.8* 11.4*  RBC 3.60*  --  3.44* 3.27*  HGB 11.0* 12.6 10.2* 9.6*  HCT 36.3 37.0 33.4* 31.8*  MCV 100.8*  --  97.1 97.2  MCH  30.6  --  29.7 29.4  MCHC 30.3  --  30.5 30.2  RDW 17.9*  --  17.8* 17.7*  PLT 340  --  298 306   Thyroid  Recent Labs  Lab 05/28/23 1955  TSH 0.625    BNP Recent Labs  Lab 05/28/23 1345  BNP 721.2*    DDimer No results for input(s): "DDIMER" in the last 168 hours.   Radiology    NM Pulmonary Perfusion  Result Date: 05/29/2023 CLINICAL DATA:  Concern for pulmonary embolism. EXAM: NUCLEAR MEDICINE PERFUSION LUNG SCAN TECHNIQUE: Perfusion images were obtained in multiple projections after intravenous injection of radiopharmaceutical. Ventilation scans intentionally deferred if perfusion scan and chest x-ray adequate for interpretation during COVID 19 epidemic. RADIOPHARMACEUTICALS:  4.3 mCi Tc-62m MAA IV COMPARISON:  Chest radiograph-earlier same day; 12/29/2022 nuclear medicine perfusion scan-07/30/2022 FINDINGS: Review of chest radiograph performed earlier today demonstrates enlarged cardiac silhouette and mediastinal contours. Atherosclerotic plaque within the thoracic aorta. Minimal perihilar and bibasilar heterogeneous opacities favored to represent atelectasis. No pleural effusion. Pulmonary vasculature is indistinct. The examination is degraded due to patient body habitus. Perfusion images: There is relative homogeneous distribution of  injected radiotracer throughout the bilateral pulmonary parenchyma without discrete area of segmental or subsegmental perfusion defect to suggest pulmonary embolism. IMPRESSION: Pulmonary embolism absent (very low probability of pulmonary embolism). Electronically Signed   By: Simonne Come M.D.   On: 05/29/2023 15:25   DG Chest Portable 1 View  Result Date: 05/28/2023 CLINICAL DATA:  Chest pain since last night. History of atrial fibrillation and hypertension. Diabetes. EXAM: PORTABLE CHEST 1 VIEW COMPARISON:  12/28/2022 FINDINGS: Shallow inspiration. Diffuse cardiac enlargement. Bilateral perihilar infiltrates, increasing since previous study. These may represent multifocal pneumonia or edema. No pleural effusions. No pneumothorax. Mediastinal contours appear intact. Calcified and tortuous aorta. IMPRESSION: Cardiac enlargement with increasing bilateral pulmonary infiltrates. Electronically Signed   By: Burman Nieves M.D.   On: 05/28/2023 15:41    Cardiac Studies   Echo 07/30/2022  1. Left ventricular ejection fraction, by estimation, is 60 to 65%. The  left ventricle has normal function. The left ventricle has no regional  wall motion abnormalities. There is mild left ventricular hypertrophy.  Left ventricular diastolic parameters  are consistent with Grade II diastolic dysfunction (pseudonormalization).  Elevated left ventricular end-diastolic pressure.   2. Right ventricular systolic function is normal. The right ventricular  size is mildly enlarged. Tricuspid regurgitation signal is inadequate for  assessing PA pressure.   3. Left atrial size was severely dilated.   4. The mitral valve is degenerative. Trivial mitral valve regurgitation.  No evidence of mitral stenosis.   5. The aortic valve is abnormal. Unable to determine aortic valve  morphology due to image quality. There is moderate calcification of the  aortic valve. Aortic valve regurgitation is trivial. Moderate aortic valve   stenosis. Aortic valve area, by VTI  measures 1.16 cm. Aortic valve mean gradient measures 31.0 mmHg. Aortic  valve Vmax measures 3.58 m/s.   6. The inferior vena cava is dilated in size with >50% respiratory  variability, suggesting right atrial pressure of 8 mmHg.   7. Increased flow velocities may be secondary to anemia, thyrotoxicosis,  hyperdynamic or high flow state.   Patient Profile      Chelsea [name] is a 57 y.o. female with a hx of chronic respiratory failure on 5 L oxygen 24/7 secondary to OHS/OSA, she has a prior history of prior trach reverted in follow-up 2023, COPD, history  of upper GI bleed 10/23, paroxysmal atrial fibrillation/atrial flutter, aortic stenosis and HFpEF who is being seen 05/28/2023 for the evaluation of aflutter with RVR and CHF at the request of Dr. Earlene Plater.   Assessment & Plan    1  Atrial flutter  Previously evaluated at [facility] in 2022  Mention consideration  for ablation at one point  No follow up Pt presented to [facility] with resp failure in aflutter with RVR   Initally treated  With amiodarone and some digoxin  Not a great combination but long term amio not felt to be great option with lung issues REmains tachy on IV amiodarone   Got bolus yesterday and rates maintained at 60 mg /hour   She got 0.25 dig yesterday   I will give 2 doses today  Follow  Keep on Eliquis  2  HFpEF   She is diuresing some with lasix  She is incontinent so numbers are not accurate   I would like to have foley placed for a short time (2 days) to follow I/O more accurately   Per nursing, pur wic would not hold Give lasix earlier in day   May switch to tid  Cr rel stable    3  Aortic stenosis   MOderate on echo in Oct 2023   Will need repeat eval once rates improved and clinically improved   4  Pulmonary On 7L [place]   (baseline reported 5 )     For questions or updates, please contact [facility] Please consult [email or web address] for contact info under         Signed, Dietrich Pates, MD  05/30/2023, 10:36 AM

## 2023-05-30 NOTE — Plan of Care (Signed)
  Problem: Clinical Measurements: Goal: Respiratory complications will improve Outcome: Progressing   

## 2023-05-30 NOTE — Assessment & Plan Note (Addendum)
Reported that pt has been having some anxiety at night. Night team ordered atarax 25mg  TID PRN for this - restarted home wellbutrin 100mg  bid today

## 2023-05-30 NOTE — Assessment & Plan Note (Deleted)
-   Decreased gabapentin to 100mg  TID yesterday, bedtime dose was increased to 200mg  last night for restless legs - Percocet 5-325mg  Q6h PRN

## 2023-05-30 NOTE — Progress Notes (Signed)
   Pt remains tachycardic 140 to 150 bpm despite digoxin and amiodarone (60mg /hour)   Current  SBP 100,  was 88/ earlier  Breathing improved from yesterday  Pt says she has not missed any Eliquis doses at facility     We have reached out to them to confirm  Keep NPO after MN   Plan for possible cardioversion tomorrow  Hold extra lasix for now    With hx moderate AS in 2023 will not tolerate dehydration and rapid rates    Follow BP  Limit pain meds as will add to hypotension   Dietrich Pates MD

## 2023-05-30 NOTE — Progress Notes (Signed)
   05/30/23 1213  Assess: MEWS Score  Temp 97.7 F (36.5 C)  BP 93/66  MAP (mmHg) 73  ECG Heart Rate (!) 146  Resp (!) 26  O2 Device HFNC  O2 Flow Rate (L/min) 7 L/min  Assess: MEWS Score  MEWS Temp 0  MEWS Systolic 1  MEWS Pulse 3  MEWS RR 2  MEWS LOC 0  MEWS Score 6  MEWS Score Color Red  Assess: if the MEWS score is Yellow or Red  Were vital signs accurate and taken at a resting state? Yes  Does the patient meet 2 or more of the SIRS criteria? No  MEWS guidelines implemented  No, previously red, continue vital signs every 4 hours  Notify: Charge Nurse/RN  Name of Charge Nurse/RN Notified Darl Pikes RN  Notify: Rapid Response  Name of Rapid Response RN Notified Kristie Everheart  Date Rapid Response Notified 05/30/23  Time Rapid Response Notified 1353  Assess: SIRS CRITERIA  SIRS Temperature  0  SIRS Pulse 1  SIRS Respirations  1  SIRS WBC 0  SIRS Score Sum  2   CN aware Rapid RN aware. HR remained elevated from admission

## 2023-05-30 NOTE — Assessment & Plan Note (Addendum)
-   pulmicort nebulizer BID - try to wean down to home O2 5L - O2 goal 88-92%

## 2023-05-30 NOTE — Progress Notes (Incomplete)
FMTS Brief Progress Note  S:Thankfully has converted from flutter. Is now in A Fib, though remains with RVR, rates in the mid-120s.  Is scheduled for DCCV with Dr. Bjorn Pippin tomorrow.   O: BP (!) 83/56 (BP Location: Left Wrist)   Pulse (!) 146   Temp 97.8 F (36.6 C) (Oral)   Resp 20   Wt (!) 156.7 kg   SpO2 96%   BMI 57.49 kg/m     A/P: - Appreciate cardiology's assistance with rate control in this difficult case. Now that she is in A fib I'm hopeful we can focus more on rate control measures, though for now we remain a bit limited by BP.  - She is NPO ahead of DCCV. We will see if she's able to hold sinus rhythm, though with severe biatrial enlargement on echo, there's a good chance she won't.  - Holding diuresis for now in the setting of borderline BPs.Now has foley placed for more accurate recording of I/Os. - Need to ensure BiPAP use overnight - Orders reviewed. Labs for AM {ordered not order:23822}, which was adjusted as needed.  - If condition changes, plan includes ***.   Chelsea Amel, MD 05/30/2023, 11:18 PM PGY-3, [facility] Night Resident  Please page [phone] with questions.

## 2023-05-30 NOTE — Assessment & Plan Note (Addendum)
Iron panel with iron of 13, decreased TIBC 392, decreased ferritin 29 this morning. Pt takes ferrous sulfate 325mg  every day at facility. Of note, Hemoglobin decreased from 10.2 to 9.6 this morning. - restarted iron supplement - fereheme IV today - consider repeat iron studies - History of GI bleed, will collect FOBT today - Repeat CBC in AM - Transfusion if <7 - Consider switching eliquis to heparin if hemoglobin continues trending down

## 2023-05-30 NOTE — Assessment & Plan Note (Addendum)
Likely multifactorial.   - NM perfusion scan negative for PE - Patient requires 5L at home  - Weaned from BiPAP to HFNC 7L this morning due to improved VBG. Satting at 99%, could consider decreasing O2 - Could consider redosing steroids pending patient's clinical course - Has history of trach placed in July 2023, can consult CCM for respiratory support if condition changes

## 2023-05-30 NOTE — Hospital Course (Addendum)
Chelsea [name] is a 57 y.o.female with a history of COPD on 5L at home, HFpEF, PAF who was admitted to the [facility] Teaching [facility] at [facility] for acute on chronic respiratory failure, A flutter with RVR. Her hospital course is detailed below:  Aflutter with RVR Pt presented initially in A flutter with RVR in the 140s. Pt was put on amiodarone drip and received several doses of digoxin. Converted to normal sinus rhythm early on 8/30.  Patient weaned to 200mg  amiodarone twice daily. Will transition to 200 mg daily at home on 9/20. Per cardiology, patient would not be a good candidate for ablation given her current body habitus.  Acute on chronic diastolic CHF exacerbation Pt presented initially with elevated BNP at 721 and CXR suggestive of pulmonary edema. Suspected that her elevated heart rate contributed to CHF exacerbation. Pt was diuresed initially with Lasix 80mg  BID, this was increased to TID.  With resolution of fluid status, home Spironolactone 25 mg daily and Torsemide 40 mg BID restarted.   Acute on chronic respiratory failure Suspected that this is multifactorial.  VQ scan negative for PE. Patient was initially requiring BiPAP, eventually transitioned to HFNC at 7L, and then to her home O2 at 5 L.  Restless leg syndrome Patient was on gabapentin 400 mg TID at her facility.  Has been taking 300 mg TID during hospitalization.  Had Percocet and Toradol q6 PRN for pain.  Added Requip nightly for restless legs.  Chronic iron deficiency anemia Patient with chronically low iron.  Iron panel here with low iron, TIBC, ferritin.  Hemoglobin also trending down.  We restarted her home iron supplement, added IV Feraheme x1.    Other chronic conditions were medically managed with home medications and formulary alternatives as necessary (COPD, T2DM, anxiety, hypothyroidism, HTN, HLD)  PCP Follow-up Recommendations: Follow up on Requip that was started for RLS  Reassess vitamin D level,  discontinued Vitamin D 5000U every day due to hyperphosphatemia. Also f/u on PTH result that was pending at discharge. Discontinued omeprazole, recommend restarting if GERD sx Consider adjusting statin dose, triglycerides 197 Consider PFTs - unclear what her last results were Consider switching to different agent from jardiance if she gets a perineal infection Continue on amiodarone 200 mg twice a day for total of 2 weeks then transition to 200 mg a day after (06/21/23).

## 2023-05-30 NOTE — Assessment & Plan Note (Addendum)
Cr 1.36 > 1.5 > now 1.07. Normal at baseline - Improving - Suspect secondary to decompensated heart failure

## 2023-05-30 NOTE — Progress Notes (Signed)
Cardiology plan of care note  Briefly 57 yo F with super morbid obesity, 5 L COPD, OSA and OHS (had prior trach), with moderate AS with HFpEF.  Seen by cardiologist for 2!: AFL. Called for midsternal severe chest pain.  In the center of her chest.  This has now resolved. She was un aware of  her prior AS  Exam notable for  Super morbid obesity.  Total body anasarca.   Harsh systolic murmur. Requiring [place] (as per baseline) Heart rates have improved 150-> 122 with variance. BP 84/90.  EKG A fiib RVR Tele: AFL 2:1 Conduction-> AF RVR Personally reviewed relevant tests; No prior coronary calcifications late 2023  Would recommend  - concerning for symptomatic atrial flutter vs elevated LVEDP in the setting of AS.  Improved with transitioning to AF RVR. - agree with DCCV tomorrow, NPO at midnight - hs troponin has been negative X2; if repeat pain will re-check - give 150 mg IV Amidarone bolus, BP too low for BB   Riley Lam, MD FASE [facility] Cardiologist [facility]  [address] [place], [place] [address] [phone]  8:12 PM

## 2023-05-30 NOTE — Plan of Care (Signed)

## 2023-05-30 NOTE — Assessment & Plan Note (Addendum)
HR still in 140s, appears sinus tach, has been 140s throughout the night - Amiodarone loading dose x1 and digoxin x1 on admission, currently on amiodarone drip - Additional digoxin x3 yesterday - Digoxin x2 today per cardiology - Cardiology following, appreciate recs - continue home eliquis 5mg . Holding home metoprolol

## 2023-05-30 NOTE — Assessment & Plan Note (Addendum)
-   Decreased gabapentin to 100mg  TID yesterday due to fatigue, increasing to 300mg  TID today as pt is having worsening pain. Pt on 400mg  TID at home. - Restarted home ferrous sulfate 325mg  daily - Percocet 5-325mg  Q6h PRN for now - Could consider requip in the future

## 2023-05-30 NOTE — Progress Notes (Signed)
Noticed that patient's blood pressures were decreasing per her chart.  Her blood pressure was 82/56 in the chart, so I went to evaluate patient.  When I arrived patient was asymptomatic.  States that she actually feels much better right now than she has felt since she has been here.  No chest pain, shortness of breath, dizziness.  On the monitor when I arrived, her blood pressure was 68/44 with a MAP of 53.  The next few trended up, to 93/66 with MAP 73.  Of note, blood pressures have been taken with the cuff on her forearm.  Not able to put the cuff on patient's upper arm due to arm size and pain in her arm due to arthritis. Rapid response was called by nursing team due to her vitals.  Rapid response RN evaluated patient, feels comfortable continuing to monitor and I agree.  We both think that this is due to inaccurate blood pressure readings due to the cuff.  Advised patient's RN that if patient becomes symptomatic or she has any additional concerns to please let us know immediately.  RN understands and is comfortable with that.  Patient understands what happened and is comfortable with plan of care as well.  Messaged cardiology regarding recent vitals since they are also following patient.  Rapid response will keep this patient on their list to continue to lay eyes on.

## 2023-05-30 NOTE — Progress Notes (Signed)
Daily Progress Note Intern Pager: [phone]  Patient name: Chelsea [name] Medical record number: [id number] Date of birth: [birth year] Age: 57 y.o. Gender: female  Primary Care Provider: Pcp, No Consultants: cardiology Code Status: full code  Pt Overview and Major Events to Date:  8/27 - admitted  Assessment and Plan:  57 year old female with past medical history of chronic respiratory failure requiring 5 L oxygen at home, COPD, HFpEF, PAF, tracheostomy (reverted in 2023) presenting initially for tachycardia, increased shortness of breath, intermittent chest pain.  Patient has had multiple admissions for similar symptoms.  Suspect this is multifactorial. Differentials include A flutter with RVR potentially leading to CHF exacerbation, COPD exacerbation Patient continues to be tachycardic in the 140s.  Less suspicious for COPD exacerbation as patient still does not have audible wheezing or coughing, although exam is limited by body habitus. VQ scan negative for PE. Cardiology will continue to follow, will try to obtain echo when patient's heart rate comes down, if it persistently remains elevated will have DCCV. Cardiology still following. Will continue to work on pain management for pt's restless legs today.   Assessment & Plan Atrial flutter with rapid ventricular response (HCC) HR still in 140s, appears sinus tach, has been 140s throughout the night - Amiodarone loading dose x1 and digoxin x1 on admission, currently on amiodarone drip - Additional digoxin x3 yesterday - Digoxin x2 today per cardiology - Cardiology following, appreciate recs - continue home eliquis 5mg . Holding home metoprolol Acute on chronic diastolic CHF (congestive heart failure), NYHA class 3 (HCC) Suspect this to be main contributing factor to patient's shortness of breath, probably exacerbated by tachycardia - BNP 721 on admission, chest x-ray suspicious for pulmonary edema - Cardiology following,  appreciate recs - Strict I's and O's.  Net output -202.8 yesterday. Pt's RN will monitor closely today, she believes there may have been some missed documentation and a leak in pt's purewick.  - Will place order for foley cath for better monitoring of output while diuresing.  - Will plan for echo once heart rate comes down.  Consider DCCV later in week if heart rate remains elevated. - Lasix 80 mg IV increased to three times daily today per cards - K 3.5 this morning, repleted, goal K >4, Mg >2 Acute on chronic respiratory failure with hypoxia (HCC) Likely multifactorial.   - NM perfusion scan negative for PE - Patient requires 5L at home  - Weaned from BiPAP to HFNC 7L this morning due to improved VBG. Satting at 99%, could consider decreasing O2 - Could consider redosing steroids pending patient's clinical course - Has history of trach placed in July 2023, can consult CCM for respiratory support if condition changes Restless leg syndrome - Decreased gabapentin to 100mg  TID yesterday due to fatigue, increasing to 300mg  TID today as pt is having worsening pain. Pt on 400mg  TID at home. - Restarted home ferrous sulfate 325mg  daily - Percocet 5-325mg  Q6h PRN for now - Could consider requip in the future Chronic iron deficiency anemia Iron panel with iron of 13, decreased TIBC 392, decreased ferritin 29 this morning. Pt takes ferrous sulfate 325mg  every day at facility. Of note, Hemoglobin decreased from 10.2 to 9.6 this morning. - restarted iron supplement - fereheme IV today - consider repeat iron studies - History of GI bleed, will collect FOBT today - Repeat CBC in AM - Transfusion if <7 - Consider switching eliquis to heparin if hemoglobin continues trending down Anxiety Reported that pt  has been having some anxiety at night. Night team ordered atarax 25mg  TID PRN for this - restarted home wellbutrin 100mg  bid today Acute kidney injury (HCC) Cr 1.36 > 1.5 > now 1.07. Normal at  baseline - Improving - Suspect secondary to decompensated heart failure DM2 (diabetes mellitus, type 2) (HCC) Fasting BGL this morning 166, 4 units novolog given - resistant sliding scale - restarting jardiance 10mg  daily today - continue CBG monitoring before meals and at bedtime for now COPD (chronic obstructive pulmonary disease) with chronic bronchitis - pulmicort nebulizer BID - try to wean down to home O2 5L - O2 goal 88-92%  Chronic and Stable Problems:  Shingles two weeks ago  Hyperthyroidism - levothyroxine daily HTN - torsemide 40mg  bid, spironolactone 25mg  daily at home  HLD - atorvastatin 20mg  every day OSA  FEN/GI: NPO due to bipap PPx: eliquis 5mg  Dispo:pending clinical improvement    Subjective:  Resting comfortably, breathing much better this morning. Does report increased pain in her legs and increased restlessness.   Objective: Temp:  [98 F (36.7 C)-98.4 F (36.9 C)] 98 F (36.7 C) (08/29 0454) Pulse Rate:  [141-149] 142 (08/29 0856) Resp:  [18-21] 20 (08/29 0835) BP: (97-152)/(62-119) 105/74 (08/29 0835) SpO2:  [90 %-98 %] 93 % (08/29 0856) FiO2 (%):  [40 %] 40 % (08/29 0528) Weight:  [156.7 kg] 156.7 kg (08/29 0454) Physical Exam: General: no acute distress, on 7L HFNC Cardiovascular: tachycardic, rhythm sounds regular Respiratory: no increased work of breathing, conversant, lung sounds appear clear but difficult to auscultate due to body habitus Abdomen: soft, non tender Extremities: BLE edema unchanged from my previous exam, appears chronic with overlying skin changes. Increased sporadic movement in bilateral legs.   Laboratory: Most recent CBC Lab Results  Component Value Date   WBC 11.4 (H) 05/30/2023   HGB 9.6 (L) 05/30/2023   HCT 31.8 (L) 05/30/2023   MCV 97.2 05/30/2023   PLT 306 05/30/2023   Most recent BMP    Latest Ref Rng & Units 05/30/2023    3:40 AM  BMP  Glucose 70 - 99 mg/dL 161   BUN 6 - 20 mg/dL 37   Creatinine  0.96 - 1.00 mg/dL 0.45   Sodium 409 - 811 mmol/L 130   Potassium 3.5 - 5.1 mmol/L 3.5   Chloride 98 - 111 mmol/L 83   CO2 22 - 32 mmol/L 33   Calcium 8.9 - 10.3 mg/dL 8.0    Other pertinent labs  Iron 13, nml ferritin  Imaging/Diagnostic Tests: NM Pulmonary Perfusion  IMPRESSION: Pulmonary embolism absent (very low probability of pulmonary embolism).  Kue Fox, DO 05/30/2023, 11:04 AM  PGY-1, [facility] FPTS Intern pager: [phone], text pages welcome Secure chat group [facility] Teaching Service

## 2023-05-30 NOTE — Progress Notes (Signed)
Updated primary RN on patient's vital signs. Rapid response RN contacted due to increased pulse rate and decreased blood pressure. Rapid response RN coming to assess patient.

## 2023-05-30 NOTE — Assessment & Plan Note (Addendum)
Suspect this to be main contributing factor to patient's shortness of breath, probably exacerbated by tachycardia - BNP 721 on admission, chest x-ray suspicious for pulmonary edema - Cardiology following, appreciate recs - Strict I's and O's.  Net output -202.8 yesterday. Pt's RN will monitor closely today, she believes there may have been some missed documentation and a leak in pt's purewick.  - Will place order for foley cath for better monitoring of output while diuresing.  - Will plan for echo once heart rate comes down.  Consider DCCV later in week if heart rate remains elevated. - Lasix 80 mg IV increased to three times daily today per cards - K 3.5 this morning, repleted, goal K >4, Mg >2

## 2023-05-30 NOTE — Assessment & Plan Note (Addendum)
Fasting BGL this morning 166, 4 units novolog given - resistant sliding scale - restarting jardiance 10mg  daily today - continue CBG monitoring before meals and at bedtime for now

## 2023-05-30 NOTE — Plan of Care (Signed)
  Problem: Education: Goal: Ability to describe self-care measures that may prevent or decrease complications (Diabetes Survival Skills Education) will improve Outcome: Progressing Goal: Individualized Educational Video(s) Outcome: Progressing   Problem: Coping: Goal: Ability to adjust to condition or change in health will improve Outcome: Progressing   

## 2023-05-31 ENCOUNTER — Encounter (HOSPITAL_COMMUNITY)
Admission: EM | Disposition: A | Discharge status: Skilled Nursing Facility | Payer: Self-pay | Source: Skilled Nursing Facility | Attending: Family Medicine

## 2023-05-31 DIAGNOSIS — I4892 Unspecified atrial flutter: Secondary | ICD-10-CM | POA: Diagnosis not present

## 2023-05-31 DIAGNOSIS — I5033 Acute on chronic diastolic (congestive) heart failure: Secondary | ICD-10-CM | POA: Diagnosis not present

## 2023-05-31 LAB — CBC
HCT: 32.1 % — ABNORMAL LOW (ref 36.0–46.0)
Hemoglobin: 9.7 g/dL — ABNORMAL LOW (ref 12.0–15.0)
MCH: 29.1 pg (ref 26.0–34.0)
MCHC: 30.2 g/dL (ref 30.0–36.0)
MCV: 96.4 fL (ref 80.0–100.0)
Platelets: 315 10*3/uL (ref 150–400)
RBC: 3.33 MIL/uL — ABNORMAL LOW (ref 3.87–5.11)
RDW: 17.5 % — ABNORMAL HIGH (ref 11.5–15.5)
WBC: 7.1 10*3/uL (ref 4.0–10.5)
nRBC: 0 % (ref 0.0–0.2)

## 2023-05-31 LAB — MAGNESIUM: Magnesium: 2.5 mg/dL — ABNORMAL HIGH (ref 1.7–2.4)

## 2023-05-31 LAB — BASIC METABOLIC PANEL
Anion gap: 13 (ref 5–15)
BUN: 26 mg/dL — ABNORMAL HIGH (ref 6–20)
CO2: 33 mmol/L — ABNORMAL HIGH (ref 22–32)
Calcium: 8.5 mg/dL — ABNORMAL LOW (ref 8.9–10.3)
Chloride: 90 mmol/L — ABNORMAL LOW (ref 98–111)
Creatinine, Ser: 0.68 mg/dL (ref 0.44–1.00)
GFR, Estimated: >60 mL/min (ref >60)
Glucose, Bld: 230 mg/dL — ABNORMAL HIGH (ref 70–99)
Potassium: 3.6 mmol/L (ref 3.5–5.1)
Sodium: 136 mmol/L (ref 135–145)

## 2023-05-31 LAB — LIPID PANEL
Cholesterol: 144 mg/dL (ref 0–200)
HDL: 29 mg/dL — ABNORMAL LOW (ref >40)
LDL Cholesterol: 76 mg/dL (ref 0–99)
Total CHOL/HDL Ratio: 5 RATIO
Triglycerides: 197 mg/dL — ABNORMAL HIGH (ref <150)
VLDL: 39 mg/dL (ref 0–40)

## 2023-05-31 LAB — GLUCOSE, CAPILLARY
Glucose-Capillary: 141 mg/dL — ABNORMAL HIGH (ref 70–99)
Glucose-Capillary: 166 mg/dL — ABNORMAL HIGH (ref 70–99)
Glucose-Capillary: 202 mg/dL — ABNORMAL HIGH (ref 70–99)
Glucose-Capillary: 191 mg/dL — ABNORMAL HIGH (ref 70–99)

## 2023-05-31 SURGERY — CARDIOVERSION
Anesthesia: General

## 2023-05-31 MED ORDER — INSULIN GLARGINE-YFGN 100 UNIT/ML ~~LOC~~ SOLN
5.0000 [IU] | Freq: Every day | SUBCUTANEOUS | Status: DC
  Administered 2023-05-31 – 2023-06-08 (×9): 5 [IU] via SUBCUTANEOUS
  Filled 2023-05-31 (×9): qty 0.05

## 2023-05-31 MED ORDER — POTASSIUM CHLORIDE CRYS ER 20 MEQ PO TBCR
40.0000 meq | EXTENDED_RELEASE_TABLET | Freq: Once | ORAL | Status: AC
  Administered 2023-05-31: 40 meq via ORAL

## 2023-05-31 MED ORDER — AMIODARONE HCL 200 MG PO TABS
400.0000 mg | ORAL_TABLET | Freq: Two times a day (BID) | ORAL | Status: DC
  Administered 2023-05-31 – 2023-06-02 (×5): 400 mg via ORAL
  Filled 2023-05-31 (×5): qty 2

## 2023-05-31 NOTE — Progress Notes (Signed)
Pts. Ring removed from finger and placed in her pocketbook side pocket.

## 2023-05-31 NOTE — Progress Notes (Signed)
Daily Progress Note Intern Pager: [phone]  Patient name: Chelsea [name] Medical record number: [id number] Date of birth: December 27, 1965 Age: 57 y.o. Gender: female  Primary Care Provider: Pcp, No Consultants: cardiology Code Status: full code  Pt Overview and Major Events to Date:  8/27 - admitted  8/30 - converted to NSR  Assessment and Plan:  57 year old female with past medical history of chronic respiratory failure requiring 5 L oxygen at home, COPD, HFpEF, PAF, tracheostomy (reverted in 2023) presenting initially for tachycardia, increased shortness of breath, intermittent chest pain. Patient has had multiple admissions for similar symptoms.   Patient was to get cardioverted today, however did go into normal sinus rhythm last night after receiving an additional amiodarone bolus 150mg .  Cardiology to do echo to evaluate murmur and reevaluate ejection fraction today. Assessment & Plan Atrial flutter with rapid ventricular response (HCC) Pt converted to NSR rates in 80s since last night. Did receive extra amio bolus last night by cardiology due to chest pain. - Amiodarone loading dose x1 and digoxin x1 on admission, with several additional digoxin doses, currently on amiodarone drip - Will stop digoxin, Continue amiodarone and transition to 400 mg bid today per cards - Cardiology holding lasix for now  - Echo to evaluate murmur - Cardiology following, appreciate recs - continue home eliquis 5mg . Holding home metoprolol Acute on chronic diastolic CHF (congestive heart failure), NYHA class 3 (HCC) Suspect this to be main contributing factor to patient's shortness of breath, probably exacerbated by tachycardia - BNP 721 on admission, chest x-ray suspicious for pulmonary edema - Cardiology following, appreciate recs - Strict I's and O's.  Net output -986.2 yesterday. Foley in place. - Lasix 80 mg IV increased to three times daily per cards, holding for now per cards - K 3.6  this morning, repleted, goal K >4, Mg >2 Acute on chronic respiratory failure with hypoxia (HCC) Likely multifactorial.   - NM perfusion scan negative for PE - Patient requires 5L at home  - BIPAP at night, 92% on 6L HFNC this morning - Could consider redosing steroids pending patient's clinical course - Has history of trach placed in July 2023, can consult CCM for respiratory support if condition changes Restless leg syndrome - gabapentin 300mg  TID seems to have helped - Restarted home ferrous sulfate 325mg  daily - Percocet 5-325mg  Q6h PRN for now - Could consider requip in the future Chronic iron deficiency anemia Iron panel with iron of 13, decreased TIBC 392, decreased ferritin 29. Pt takes ferrous sulfate 325mg  every day at facility. Hemoglobin 10.2 > 9.6 > 9.7 this morning, now stable - restarted iron supplement - fereheme IV yesterday - History of GI bleed, FOBT ordered yesterday - Transfusion if <7 - Consider switching eliquis to heparin if hemoglobin continues trending down Anxiety Reported that pt has been having some anxiety at night  - atarax 25mg  TID PRN - restarted home wellbutrin 100mg  bid yesterday Acute kidney injury (HCC) Cr 1.36 > 1.5 > 1.07 > now 0.68 and resolved - Suspect secondary to decompensated heart failure DM2 (diabetes mellitus, type 2) (HCC) Fasting BGL this morning 141, 3 units novolog given - resistant sliding scale - jardiance 10mg  daily restarted yesterday - will consider starting 5U semglee  - continue CBG monitoring before meals and at bedtime for now COPD (chronic obstructive pulmonary disease) with chronic bronchitis - pulmicort nebulizer BID - try to wean down to home O2 5L - O2 goal 88-92%  Chronic and Stable Problems:  Shingles two weeks ago  Hypothyroidism - levothyroxine daily HTN - torsemide 40mg  bid, spironolactone 25mg  daily at home  HLD - atorvastatin 20mg  every day OSA   FEN/GI: NPO today - procedure PPx:  eliquis Dispo:pending clinical improvement   Subjective:  Patient converted to normal sinus rhythm at a rate of 86 last night.  No new changes, was still on amiodarone drip.  Potential sign of rate dependent left ventricular strain on EKG obtained last night. Patient resting this morning, states that she feels fine with no complaints. Happy that her HR has come down.    Objective: Temp:  [97.6 F (36.4 C)-100 F (37.8 C)] 100 F (37.8 C) (08/30 0345) Pulse Rate:  [76-148] 76 (08/30 0754) Resp:  [15-26] 20 (08/30 1100) BP: (82-109)/(56-71) 101/71 (08/30 1100) SpO2:  [91 %-99 %] 91 % (08/30 1100) FiO2 (%):  [50 %] 50 % (08/30 1100) Weight:  [153.4 kg] 153.4 kg (08/30 0345) Physical Exam: General: Resting in bed, no acute distress Cardiovascular: Systolic murmur noted, regular rate, regular rhythm Respiratory: No increased work of breathing, on BiPAP, breath sounds appear clear but exam limited due to body habitus Abdomen: Soft, nontender Extremities: BLE edema unchanged from my previous exam, appears chronic with overlying skin changes. Intermittent movement in bilateral legs. Some hyperkeratosis overlying bilateral shins.  Laboratory: Most recent CBC Lab Results  Component Value Date   WBC 7.1 05/31/2023   HGB 9.7 (L) 05/31/2023   HCT 32.1 (L) 05/31/2023   MCV 96.4 05/31/2023   PLT 315 05/31/2023   Most recent BMP    Latest Ref Rng & Units 05/31/2023   10:25 AM  BMP  Glucose 70 - 99 mg/dL 829   BUN 6 - 20 mg/dL 26   Creatinine 5.62 - 1.00 mg/dL 1.30   Sodium 865 - 784 mmol/L 136   Potassium 3.5 - 5.1 mmol/L 3.6   Chloride 98 - 111 mmol/L 90   CO2 22 - 32 mmol/L 33   Calcium 8.9 - 10.3 mg/dL 8.5    Imaging/Diagnostic Tests: None  Copelyn Widmer, DO 05/31/2023, 11:52 AM  PGY-1, [place] [facility] FPTS Intern pager: [phone], text pages welcome Secure chat group [facility] Teaching Service

## 2023-05-31 NOTE — Care Management Important Message (Signed)
Important Message  Patient Details  Name: Chelsea [name] MRN: [id number] Date of Birth: July 04, 1966   Medicare Important Message Given:  Yes     Renie Ora 05/31/2023, 8:38 AM

## 2023-05-31 NOTE — Assessment & Plan Note (Addendum)
Cr 1.36 > 1.5 > 1.07 > now 0.68 and resolved - Suspect secondary to decompensated heart failure

## 2023-05-31 NOTE — Assessment & Plan Note (Addendum)
Suspect this to be main contributing factor to patient's shortness of breath, probably exacerbated by tachycardia - BNP 721 on admission, chest x-ray suspicious for pulmonary edema - Cardiology following, appreciate recs - Strict I's and O's.  Net output -986.2 yesterday. Foley in place. - Lasix 80 mg IV increased to three times daily per cards, holding for now per cards - K 3.6 this morning, repleted, goal K >4, Mg >2

## 2023-05-31 NOTE — Assessment & Plan Note (Addendum)
Pt converted to NSR rates in 80s since last night. Did receive extra amio bolus last night by cardiology due to chest pain. - Amiodarone loading dose x1 and digoxin x1 on admission, with several additional digoxin doses, currently on amiodarone drip - Will stop digoxin, Continue amiodarone and transition to 400 mg bid today per cards - Cardiology holding lasix for now  - Echo to evaluate murmur - Cardiology following, appreciate recs - continue home eliquis 5mg . Holding home metoprolol

## 2023-05-31 NOTE — Assessment & Plan Note (Addendum)
Likely multifactorial.   - NM perfusion scan negative for PE - Patient requires 5L at home  - BIPAP at night, 92% on 6L HFNC this morning - Could consider redosing steroids pending patient's clinical course - Has history of trach placed in July 2023, can consult CCM for respiratory support if condition changes

## 2023-05-31 NOTE — Plan of Care (Signed)

## 2023-05-31 NOTE — Assessment & Plan Note (Addendum)
Fasting BGL this morning 141, 3 units novolog given - resistant sliding scale - jardiance 10mg  daily restarted yesterday - will consider starting 5U semglee  - continue CBG monitoring before meals and at bedtime for now

## 2023-05-31 NOTE — Assessment & Plan Note (Addendum)
Iron panel with iron of 13, decreased TIBC 392, decreased ferritin 29. Pt takes ferrous sulfate 325mg  every day at facility. Hemoglobin 10.2 > 9.6 > 9.7 this morning, now stable - restarted iron supplement - fereheme IV yesterday - History of GI bleed, FOBT ordered yesterday - Transfusion if <7 - Consider switching eliquis to heparin if hemoglobin continues trending down

## 2023-05-31 NOTE — TOC Initial Note (Signed)
Transition of Care [facility]) - Initial/Assessment Note    Patient Details  Name: Chelsea [name] MRN: [id number] Date of Birth: [birth year]  Transition of Care [facility]) CM/SW Contact:    Delilah Shan, LCSWA Phone Number: 05/31/2023, 4:46 PM  Clinical Narrative:                  Patient is from [facility] long term. Patient reports plan is to return when medically ready for dc. CSW spoke with Kia who confirmed patient can return back to facility when medically ready for dc. TOC will continue to follow and assist with patients dc planning needs.        Patient Goals and CMS Choice            Expected Discharge Plan and Services                                              Prior Living Arrangements/Services                       Activities of Daily Living      Permission Sought/Granted                  Emotional Assessment              Admission diagnosis:  CHF exacerbation (HCC) [I50.9] Atrial flutter with rapid ventricular response (HCC) [I48.92] COPD (chronic obstructive pulmonary disease) with chronic bronchitis [J44.89] Acute on chronic diastolic CHF (congestive heart failure), NYHA class 3 (HCC) [I50.33] Acute on chronic respiratory failure with hypoxia (HCC) [J96.21] Hypervolemia, unspecified hypervolemia type [E87.70] Patient Active Problem List   Diagnosis Date Noted   Anxiety 05/30/2023   Atrial flutter with rapid ventricular response (HCC) 05/29/2023   Acute kidney injury (HCC) 05/29/2023   Respiratory failure with hypoxia and hypercapnia (HCC) 12/29/2022   Arthritis 11/13/2022   Asthma 11/13/2022   Chronic lower back pain 11/13/2022   Complication of anesthesia 11/13/2022   Coronary artery disease 11/13/2022   Diastolic congestive heart failure (HCC) 11/13/2022   Heart murmur 11/13/2022   History of gout 11/13/2022   Hypertension 11/13/2022   Migraine 11/13/2022   On home oxygen therapy 11/13/2022   OSA treated with BiPAP  11/13/2022   Pneumonia 11/13/2022   Chronic iron deficiency anemia 09/18/2022   Acute on chronic respiratory failure with hypoxia (HCC) 09/17/2022   Chronic disease anemia 08/01/2022   CHF exacerbation (HCC) 07/30/2022   CHF (congestive heart failure) (HCC) 07/30/2022   Upper GI bleed 07/18/2022   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   Acute blood loss anemia 07/18/2022   Acute on chronic hypoxic respiratory failure (HCC) 07/04/2022   Bleeding of the respiratory tract 04/07/2022   Acute on chronic respiratory failure with hypoxia and hypercapnia (HCC) 04/07/2022   Pulmonary edema, acute (HCC) 02/21/2022   Restrictive lung disease secondary to obesity 01/28/2022   Limitation of activity due to disability 09/18/2021   Paroxysmal atrial fibrillation (HCC) 09/04/2021   DJD (degenerative joint disease) 07/03/2021   Pressure injury of both heels, unstageable (HCC) 07/03/2021   Venous insufficiency of both lower extremities 07/02/2021   Physical deconditioning 04/24/2021   Depression 04/20/2021   History of tobacco use 04/20/2021   Hyperglycemia due to type 2 diabetes mellitus (HCC) 04/20/2021   Irritable bowel syndrome with constipation 04/20/2021  Microalbuminuria 04/20/2021   On supplemental oxygen by nasal cannula 04/20/2021   Renal insufficiency 04/20/2021   Heart failure (HCC) 04/20/2021   Moderate aortic stenosis 04/20/2021   Mild tricuspid regurgitation 04/02/2021   Mitral valve sclerosis 04/02/2021   Morbid obesity with BMI of 50.0-59.9, adult (HCC) 04/02/2021   Non-smoker 04/02/2021   Awaiting admission to adequate facility elsewhere 03/28/2021   Metabolic alkalosis 03/28/2021   Restless leg syndrome 03/22/2021   Weakness 03/20/2021   Acute congestive heart failure (HCC) 03/18/2021   Hx of atrial flutter 03/15/2021   Weight loss 03/15/2021   Atrial flutter (HCC) 03/09/2021   S/P hysterectomy 02/19/2021   Pulmonary nodule less  than 6 cm determined by computed tomography of lung 02/09/2021   Dyspnea on exertion 02/09/2021   Nocturnal hypoxemia 06/03/2020   Mixed hyperlipidemia 04/18/2020   Hair loss 09/17/2019   Chronic pain of multiple joints 06/18/2019   Irritable bowel syndrome with diarrhea 06/18/2019   Onychomycosis 06/18/2019   Acute idiopathic gout of left hand 02/16/2019   Tinea pedis of both feet 11/12/2018   Hypertension associated with diabetes (HCC) 08/25/2018   Class 3 severe obesity with serious comorbidity and body mass index (BMI) of 50.0 to 59.9 in adult (HCC) 08/25/2018   Class 3 severe obesity due to excess calories with serious comorbidity and body mass index (BMI) of 60.0 to 69.9 in adult (HCC) 08/25/2018   Chronic respiratory failure with hypoxia (HCC) 07/02/2018   Physical debility 05/10/2018   Hepatitis C    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   Chronic heart failure with preserved ejection fraction (HCC) 02/23/2018   Dyspnea 12/05/2017   Acute on chronic diastolic CHF (congestive heart failure) (HCC) 09/25/2017   Acute on chronic diastolic CHF (congestive heart failure), NYHA class 3 (HCC) 09/25/2017   Pneumonia due to gram-positive bacteria 08/24/2017   Acute respiratory failure with hypoxia and hypercarbia (HCC) 05/27/2016   Hyperlipidemia 09/28/2015   COPD with acute exacerbation (HCC) 03/04/2015   Bronchitis 05/30/2011   Neuropathic pain, leg 04/09/2011   Mononeuropathy of lower extremity 04/09/2011   Edema 03/22/2011   Morbid obesity (HCC) 03/22/2011   History of cervical cancer 03/22/2011   Lymphedema of both lower extremities 03/22/2011   Morbid (severe) obesity due to excess calories (HCC) 03/22/2011   Obesity hypoventilation syndrome (HCC) 01/01/2011   Osteoarthritis 11/28/2010   Chronic diastolic heart failure (HCC) 11/14/2010   Acquired hypothyroidism 11/13/2010   UNSPEC COMBINED SYSTOLIC&DIASTOLIC HEART FAILURE 11/13/2010   HYPERTHYROIDISM  11/10/2010   Obstructive sleep apnea 11/10/2010   Essential hypertension 11/10/2010   GERD 11/10/2010   Gastro-esophageal reflux disease without esophagitis 11/10/2010   Essential (primary) hypertension 11/10/2010   Obstructive sleep apnea (adult) (pediatric) 11/10/2010   DM2 (diabetes mellitus, type 2) (HCC) 11/10/2010   Cervical cancer (HCC) 2006   PCP:  Pcp, No Pharmacy:  No Pharmacies Listed    Social Determinants of Health (SDOH) Social History: SDOH Screenings   Food Insecurity: No Food Insecurity (12/29/2022)  Housing: Low Risk  (12/29/2022)  Transportation Needs: No Transportation Needs (12/29/2022)  Utilities: Not At Risk (12/29/2022)  Alcohol Screen: Low Risk  (07/05/2022)  Financial Resource Strain: Low Risk  (07/05/2022)  Physical Activity: Insufficiently Active (03/27/2021)   Received from [facility]  Social Connections: Unknown (02/04/2022)   Received from [facility]  Stress: No Stress Concern Present (09/15/2021)   Received from [facility], [facility]  Tobacco Use: Low Risk  (12/29/2022)  SDOH Interventions:     Readmission Risk Interventions    07/23/2022    2:19 PM 04/09/2022    4:17 PM  Readmission Risk Prevention Plan  Transportation Screening Complete Complete  PCP or Specialist Appt within 3-5 Days  Complete  HRI or Home Care Consult  Complete  Social Work Consult for Recovery Care Planning/Counseling  Complete  Palliative Care Screening  Not Applicable  Medication Review [employer]) Complete Referral to Pharmacy  PCP or Specialist appointment within 3-5 days of discharge Complete   HRI or Home Care Consult Complete   SW Recovery Care/Counseling Consult Complete   Palliative Care Screening Not Applicable   Skilled Nursing Facility Complete

## 2023-05-31 NOTE — Assessment & Plan Note (Addendum)
-   gabapentin 300mg  TID seems to have helped - Restarted home ferrous sulfate 325mg  daily - Percocet 5-325mg  Q6h PRN for now - Could consider requip in the future

## 2023-05-31 NOTE — Progress Notes (Addendum)
Rounding Note    Patient Name: Chelsea [name] Date of Encounter: 05/31/2023  [facility] Cardiologist: Donato Schultz, MD   Subjective   Breathing is better   No CP     Inpatient Medications    Scheduled Meds:  apixaban  5 mg Oral BID   atorvastatin  20 mg Oral QPM   budesonide  0.5 mg Inhalation BID   buPROPion ER  100 mg Oral BID   ferrous sulfate  325 mg Oral Daily   gabapentin  300 mg Oral TID   insulin aspart  0-20 Units Subcutaneous TID WC   levothyroxine  150 mcg Oral Q0600   Continuous Infusions:  sodium chloride 20 mL/hr at 05/30/23 2238   amiodarone 60 mg/hr (05/31/23 0503)   PRN Meds: hydrOXYzine, oxyCODONE-acetaminophen **AND** [DISCONTINUED] oxyCODONE   Vital Signs    Vitals:   05/31/23 0330 05/31/23 0345 05/31/23 0754 05/31/23 0758  BP:  (!) 94/57    Pulse:  83 76   Resp: (!) 22 19 18    Temp:  100 F (37.8 C)    TempSrc:  Axillary    SpO2: 95% 95% 95% 92%  Weight:  (!) 153.4 kg      Intake/Output Summary (Last 24 hours) at 05/31/2023 0935 Last data filed at 05/31/2023 0300 Gross per 24 hour  Intake 1038.79 ml  Output 2025 ml  Net -986.21 ml      05/31/2023    3:45 AM 05/30/2023    4:54 AM 12/31/2022    4:11 AM  Last 3 Weights  Weight (lbs) 338 lb 3 oz 345 lb 7.4 oz 308 lb 3.3 oz  Weight (kg) 153.4 kg 156.7 kg 139.8 kg      Telemetry    SR  - Personally Reviewed  ECG    No new  - Personally Reviewed  Physical Exam   GEN: Morbidly obese 57 yo   ON [place]   Neck: Neck full   Unable to assess JVP   Cardiac: RRR  II/VI systolic murmur LSB to base   Respiratory: Rel clear anteriorly   GI:  OBese  MS:1- 2+ LE edema  Chronic skin changes   Weeping  Labs    High Sensitivity Troponin:   Recent Labs  Lab 05/28/23 1345 05/28/23 1816  TROPONINIHS 11 16     Chemistry Recent Labs  Lab 05/28/23 1345 05/28/23 1404 05/29/23 1145 05/30/23 0340  NA 131* 130* 131* 130*  K 5.2* 5.0 5.2* 3.5  CL 87* 90* 88* 83*  CO2 29  --   28 33*  GLUCOSE 320* 331* 294* 325*  BUN 32* 39* 38* 37*  CREATININE 1.36* 1.50* 1.11* 1.07*  CALCIUM 8.6*  --  8.1* 8.0*  MG 2.3  --   --  2.4  PROT 7.0  --  6.9 6.5  ALBUMIN 2.9*  --  2.9* 2.8*  AST 25  --  17 13*  ALT 14  --  14 12  ALKPHOS 113  --  101 84  BILITOT 1.0  --  0.3 0.7  GFRNONAA 45*  --  57* 60*  ANIONGAP 15  --  15 14    Lipids No results for input(s): "CHOL", "TRIG", "HDL", "LABVLDL", "LDLCALC", "CHOLHDL" in the last 168 hours.  Hematology Recent Labs  Lab 05/28/23 1345 05/28/23 1404 05/29/23 1143 05/30/23 0340  WBC 14.5*  --  10.8* 11.4*  RBC 3.60*  --  3.44* 3.27*  HGB 11.0* 12.6 10.2* 9.6*  HCT 36.3  37.0 33.4* 31.8*  MCV 100.8*  --  97.1 97.2  MCH 30.6  --  29.7 29.4  MCHC 30.3  --  30.5 30.2  RDW 17.9*  --  17.8* 17.7*  PLT 340  --  298 306   Thyroid  Recent Labs  Lab 05/28/23 1955  TSH 0.625    BNP Recent Labs  Lab 05/28/23 1345  BNP 721.2*    DDimer No results for input(s): "DDIMER" in the last 168 hours.   Radiology    NM Pulmonary Perfusion  Result Date: 05/29/2023 CLINICAL DATA:  Concern for pulmonary embolism. EXAM: NUCLEAR MEDICINE PERFUSION LUNG SCAN TECHNIQUE: Perfusion images were obtained in multiple projections after intravenous injection of radiopharmaceutical. Ventilation scans intentionally deferred if perfusion scan and chest x-ray adequate for interpretation during COVID 19 epidemic. RADIOPHARMACEUTICALS:  4.3 mCi Tc-44m MAA IV COMPARISON:  Chest radiograph-earlier same day; 12/29/2022 nuclear medicine perfusion scan-07/30/2022 FINDINGS: Review of chest radiograph performed earlier today demonstrates enlarged cardiac silhouette and mediastinal contours. Atherosclerotic plaque within the thoracic aorta. Minimal perihilar and bibasilar heterogeneous opacities favored to represent atelectasis. No pleural effusion. Pulmonary vasculature is indistinct. The examination is degraded due to patient body habitus. Perfusion images: There  is relative homogeneous distribution of injected radiotracer throughout the bilateral pulmonary parenchyma without discrete area of segmental or subsegmental perfusion defect to suggest pulmonary embolism. IMPRESSION: Pulmonary embolism absent (very low probability of pulmonary embolism). Electronically Signed   By: Simonne Come M.D.   On: 05/29/2023 15:25    Cardiac Studies   Echo 07/30/2022  1. Left ventricular ejection fraction, by estimation, is 60 to 65%. The  left ventricle has normal function. The left ventricle has no regional  wall motion abnormalities. There is mild left ventricular hypertrophy.  Left ventricular diastolic parameters  are consistent with Grade II diastolic dysfunction (pseudonormalization).  Elevated left ventricular end-diastolic pressure.   2. Right ventricular systolic function is normal. The right ventricular  size is mildly enlarged. Tricuspid regurgitation signal is inadequate for  assessing PA pressure.   3. Left atrial size was severely dilated.   4. The mitral valve is degenerative. Trivial mitral valve regurgitation.  No evidence of mitral stenosis.   5. The aortic valve is abnormal. Unable to determine aortic valve  morphology due to image quality. There is moderate calcification of the  aortic valve. Aortic valve regurgitation is trivial. Moderate aortic valve  stenosis. Aortic valve area, by VTI  measures 1.16 cm. Aortic valve mean gradient measures 31.0 mmHg. Aortic  valve Vmax measures 3.58 m/s.   6. The inferior vena cava is dilated in size with >50% respiratory  variability, suggesting right atrial pressure of 8 mmHg.   7. Increased flow velocities may be secondary to anemia, thyrotoxicosis,  hyperdynamic or high flow state.   Patient Profile      Chelsea [name] is a 57 y.o. female with a hx of chronic respiratory failure on 5 L oxygen 24/7 secondary to OHS/OSA, she has a prior history of prior trach reverted in follow-up 2023, COPD,  history of upper GI bleed 10/23, paroxysmal atrial fibrillation/atrial flutter, aortic stenosis and HFpEF who is being seen 05/28/2023 for the evaluation of aflutter with RVR and CHF at the request of Dr. Earlene Plater.   Assessment & Plan    1  Atrial flutter  Previously evaluated at [facility] in 2022  Mention consideration  for ablation at one point  No follow up Pt presented to [facility] with resp failure in aflutter with RVR  Initally treated  With amiodarone and some digoxin  Amio  not felt to be a good long term option given O2 needs  Needed however due to soft SBP and high HR    Pt finally converted on amio yesterday to SR   SHe says her breathing is better     Will stop digoxin Continue amiodarone  Will transition to 400 mg bid today while in hospital     2  HFpEF   Pt has diuresed with IV lasix   Breathing better now that HR is slower  Still marked volume increase  BMET this am   Pendings Probable lasix x 1 today  Echo ordered  to reevaluate LVEF /RVEF  3  Aortic stenosis   MOderate on echo in Oct 2023  Murmur audible now that HR is slower    Echo to reevaluate    4  Pulmonary On 6 L [place]   (baseline reported 5 )  5  Diarrhea  New  Pt says that intestines started rumbling last night   NO fevers    For questions or updates, please contact [facility] Please consult [email or web address] for contact info under        Signed, Dietrich Pates, MD  05/31/2023, 9:35 AM

## 2023-05-31 NOTE — Assessment & Plan Note (Addendum)
Reported that pt has been having some anxiety at night  - atarax 25mg  TID PRN - restarted home wellbutrin 100mg  bid yesterday

## 2023-05-31 NOTE — Assessment & Plan Note (Signed)
-   pulmicort nebulizer BID - try to wean down to home O2 5L - O2 goal 88-92%

## 2023-06-01 ENCOUNTER — Encounter (HOSPITAL_COMMUNITY): Payer: Self-pay

## 2023-06-01 ENCOUNTER — Inpatient Hospital Stay (HOSPITAL_COMMUNITY): Payer: Medicare Other

## 2023-06-01 DIAGNOSIS — I351 Nonrheumatic aortic (valve) insufficiency: Secondary | ICD-10-CM

## 2023-06-01 DIAGNOSIS — J4489 Other specified chronic obstructive pulmonary disease: Secondary | ICD-10-CM | POA: Diagnosis not present

## 2023-06-01 DIAGNOSIS — I4892 Unspecified atrial flutter: Secondary | ICD-10-CM | POA: Diagnosis not present

## 2023-06-01 LAB — BASIC METABOLIC PANEL
Anion gap: 13 (ref 5–15)
BUN: 22 mg/dL — ABNORMAL HIGH (ref 6–20)
CO2: 33 mmol/L — ABNORMAL HIGH (ref 22–32)
Calcium: 8.5 mg/dL — ABNORMAL LOW (ref 8.9–10.3)
Chloride: 93 mmol/L — ABNORMAL LOW (ref 98–111)
Creatinine, Ser: 0.88 mg/dL (ref 0.44–1.00)
GFR, Estimated: >60 mL/min (ref >60)
Glucose, Bld: 156 mg/dL — ABNORMAL HIGH (ref 70–99)
Potassium: 4 mmol/L (ref 3.5–5.1)
Sodium: 139 mmol/L (ref 135–145)

## 2023-06-01 LAB — GLUCOSE, CAPILLARY
Glucose-Capillary: 140 mg/dL — ABNORMAL HIGH (ref 70–99)
Glucose-Capillary: 195 mg/dL — ABNORMAL HIGH (ref 70–99)
Glucose-Capillary: 154 mg/dL — ABNORMAL HIGH (ref 70–99)
Glucose-Capillary: 153 mg/dL — ABNORMAL HIGH (ref 70–99)

## 2023-06-01 LAB — ECHOCARDIOGRAM COMPLETE
AR max vel: 1.64 cm2
AV Area VTI: 1.59 cm2
AV Area mean vel: 1.75 cm2
AV Mean grad: 18.4 mmHg
AV Peak grad: 31.9 mmHg
Ao pk vel: 2.83 m/s
Area-P 1/2: 4.8 cm2
MV M vel: 1.91 m/s
MV Peak grad: 14.6 mmHg
S' Lateral: 3.9 cm
Weight: 5453.3 [oz_av]

## 2023-06-01 LAB — MAGNESIUM: Magnesium: 2.6 mg/dL — ABNORMAL HIGH (ref 1.7–2.4)

## 2023-06-01 MED ORDER — EMPAGLIFLOZIN 10 MG PO TABS
10.0000 mg | ORAL_TABLET | Freq: Every day | ORAL | Status: DC
  Administered 2023-06-01 – 2023-06-08 (×8): 10 mg via ORAL
  Filled 2023-06-01 (×8): qty 1

## 2023-06-01 MED ORDER — KETOROLAC TROMETHAMINE 15 MG/ML IJ SOLN
7.5000 mg | Freq: Once | INTRAMUSCULAR | Status: AC
  Administered 2023-06-01: 7.5 mg via INTRAVENOUS
  Filled 2023-06-01: qty 1

## 2023-06-01 NOTE — Progress Notes (Signed)
   06/01/23 0110  BiPAP/CPAP/SIPAP  $ Non-Invasive Ventilator  Non-Invasive Vent Subsequent  $ Face Mask XL Yes  BiPAP/CPAP/SIPAP Pt Type Adult  BiPAP/CPAP/SIPAP SERVO  Mask Type Full face mask  Mask Size Extra large  Set Rate 12 breaths/min  Respiratory Rate 17 breaths/min  IPAP 14 cmH20  PEEP 6 cmH20  FiO2 (%) 50 %  Minute Ventilation 9.8  Leak 33  Peak Inspiratory Pressure (PIP) 17  Tidal Volume (Vt) 481  Patient Home Equipment No  Auto Titrate No  Press High Alarm 30 cmH2O  Safety Check Completed by RT for Home Unit Yes, no issues noted  Oxygen Percent 50 %

## 2023-06-01 NOTE — Progress Notes (Signed)
Patient's RN messaged regarding low blood pressures and pain.  I went to evaluate patient.  She had 1 blood pressure of 88/59, recheck was 98/56.  Patient has been hypotensive throughout her entire hospital stay.  With her MAP back up to 70, will hold off on giving midodrine or treating blood pressure.  Patient requesting pain medication for her restless legs.  States that her last dose of gabapentin did not work for her.  Patient is willing to try Toradol for her pain.  Her kidney function is normal.  Of note, patient is on 10 L and satting 92%.  Patient states that she cannot go back to her facility on 10 L.  States that the max they can give her is 6 L.  Will be important to continue to try to wean her oxygen requirement down.  Her goal saturation is between 88 and 92%.  I decreased her oxygen to 8 L at bedside and she was at 91%.

## 2023-06-01 NOTE — Assessment & Plan Note (Signed)
-   gabapentin 300mg  TID  - Restarted home ferrous sulfate 325mg  daily - Percocet 5-325mg  Q6h PRN for now - Could consider requip in the future

## 2023-06-01 NOTE — Assessment & Plan Note (Signed)
Patient began having diarrhea yesterday.  She does note that she has IBS and will often get diarrhea if she goes too long without eating.  Patient was n.p.o. for the last 3 days because we thought she would be getting cardioverted.  She just started eating again yesterday -Continue to monitor this -Patient is afebrile, no abdominal pain or distention.

## 2023-06-01 NOTE — Assessment & Plan Note (Addendum)
Likely multifactorial.   - NM perfusion scan negative for PE - Patient requires 5L at home  - BIPAP at night, 91% on 10L HFNC this morning - Has history of trach placed in July 2023, can consult CCM for respiratory support if condition changes

## 2023-06-01 NOTE — Progress Notes (Signed)
   06/01/23 1829  Vitals  Temp 97.8 F (36.6 C)  Temp Source Oral  BP (!) 98/56  MAP (mmHg) 70  BP Location Left Arm  BP Method Automatic  Patient Position (if appropriate) Lying  Pulse Rate 77  Pulse Rate Source Monitor  ECG Heart Rate 77  Resp 20  Level of Consciousness  Level of Consciousness Responds to Voice  MEWS COLOR  MEWS Score Color Yellow  EKG   EKG performed? Hand deliver to RN or MD  Oxygen Therapy  SpO2 91 %  O2 Device Nasal Cannula  Pain Assessment  Pain Scale 0-10  Pain Score 5  Pain Type Chronic pain  Pain Location Leg  Pain Orientation Mid;Lower  Pain Descriptors / Indicators Aching  Pain Frequency Constant  Pain Onset Sudden  Pain Intervention(s) Distraction;Environmental changes;Emotional support  Multiple Pain Sites  (B/p :Low and drowsy)  Height and Weight  Height 5\' 5"  (1.651 m)  Weight (!) 154.6 kg  Type of Scale Used Bed  Type of Weight Actual  BSA (Calculated - sq m) 2.66 sq meters  BMI (Calculated) 56.72  Weight in (lb) to have BMI = 25 149.9  MEWS Score  MEWS Temp 0  MEWS Systolic 1  MEWS Pulse 0  MEWS RR 0  MEWS LOC 1  MEWS Score 2   Low B/p Sleepy with low 02 levels on 10L MD aware and will round at bedside. Planning for ABG or VBG

## 2023-06-01 NOTE — Progress Notes (Signed)
Daily Progress Note Intern Pager: [phone]  Patient name: Chelsea [name] Medical record number: [id number] Date of birth: [birth year] Age: 57 y.o. Gender: female  Primary Care Provider: Pcp, No Consultants: Cardiology Code Status: Full code  Pt Overview and Major Events to Date:  8/27 - admitted  8/30 - converted to NSR  Assessment and Plan:  57 year old female with past medical history of chronic respiratory failure requiring 5 L oxygen at home, COPD, HFpEF, PAF, tracheostomy (reverted in 2023) presenting initially for tachycardia, increased shortness of breath, intermittent chest pain. Patient has had multiple admissions for similar symptoms.   Patient maintains normal heart rate.  She will get an echo today to evaluate her ejection fraction and murmur that is now audible since her heart rate came down.  Echo in 2023 did show moderate aortic stenosis.  Assessment & Plan Atrial flutter with rapid ventricular response (HCC) Patient maintaining heart rate in the 70s since yesterday. - Amiodarone loading dose x1 and digoxin x1 on admission for a flutter with RVR, with several additional digoxin doses and put on amiodarone drip. - transitioned to amiodarone 200mg  BID - Dig was discontinued - Cardiology following, appreciate recs - Cardiology holding lasix for now due to diarrhea - Echo to evaluate murmur and LVEF/RVEF today - continue home eliquis 5mg . Holding home metoprolol Acute on chronic diastolic CHF (congestive heart failure), NYHA class 3 (HCC) Suspect this to be main contributing factor to patient's shortness of breath, probably exacerbated by tachycardia - BNP 721 on admission, chest x-ray suspicious for pulmonary edema - Cardiology following, appreciate recs - Strict I's and O's.  Net output -780 yesterday. Foley in place. Pt did not receive lasix yesterday - Lasix 80 mg IV TID initially, holding for now per cards 2/2 diarrhea - K 4.0 this morning - Goal K >4,  Mg >2 Acute on chronic respiratory failure with hypoxia (HCC) Likely multifactorial.   - NM perfusion scan negative for PE - Patient requires 5L at home  - BIPAP at night, 91% on 10L HFNC this morning - Has history of trach placed in July 2023, can consult CCM for respiratory support if condition changes Diarrhea Patient began having diarrhea yesterday.  She does note that she has IBS and will often get diarrhea if she goes too long without eating.  Patient was n.p.o. for the last 3 days because we thought she would be getting cardioverted.  She just started eating again yesterday -Continue to monitor this -Patient is afebrile, no abdominal pain or distention. Restless leg syndrome - gabapentin 300mg  TID  - Restarted home ferrous sulfate 325mg  daily - Percocet 5-325mg  Q6h PRN for now - Could consider requip in the future Chronic iron deficiency anemia Iron panel with iron of 13, decreased TIBC 392, decreased ferritin 29. Pt takes ferrous sulfate 325mg  every day at facility. Hemoglobin stable at 9.7 - home ferrous sulfate 325mg  daily - fereheme IV x1 Anxiety Reported that pt has been having some anxiety at night  - atarax 25mg  TID PRN - restarted home wellbutrin 100mg  bid  DM2 (diabetes mellitus, type 2) (HCC) Fasting BGL this morning 140, 3 units novolog given - resistant sliding scale - jardiance 10mg  daily restarted  - 5U semglee  - continue CBG monitoring before meals and at bedtime for now COPD (chronic obstructive pulmonary disease) with chronic bronchitis - pulmicort nebulizer BID - try to wean down to home O2 5L - O2 goal 88-92%  Chronic and Stable Problems:  Shingles two  weeks ago  Hypothyroidism - levothyroxine daily HTN - torsemide 40mg  bid, spironolactone 25mg  daily at home  HLD - atorvastatin 20mg  every day OSA  FEN/GI: Heart healthy diet PPx: eliquis Dispo: LTC  tomorrow potentially. Barriers include echo today, cardiac recs.   Subjective:  Patient  laying in bed.  No acute concerns, however wonders why she is requiring more oxygen in the hospital.  Patient also has had soft stool since yesterday, states that this is typical when she goes without eating.  Objective: Temp:  [97.6 F (36.4 C)-99.5 F (37.5 C)] 97.6 F (36.4 C) (08/31 0817) Pulse Rate:  [70-85] 77 (08/31 0817) Resp:  [15-22] 22 (08/31 0817) BP: (66-111)/(55-74) 102/56 (08/31 0817) SpO2:  [89 %-94 %] 91 % (08/31 0817) FiO2 (%):  [50 %] 50 % (08/31 0417) Weight:  [154.6 kg] 154.6 kg (08/31 0409) Physical Exam: General: Acute distress, laying in bed Cardiovascular: Systolic murmur, regular rate, regular rhythm Respiratory: No increased work of breathing, no respiratory distress, appears to have clear breath sounds bilaterally Abdomen: Bowel sounds normal.  Soft, nontender, nondistended Extremities: BLE edema unchanged from my previous exam, appears chronic with overlying skin changes. Intermittent movement in bilateral legs.   Laboratory: Most recent CBC Lab Results  Component Value Date   WBC 7.1 05/31/2023   HGB 9.7 (L) 05/31/2023   HCT 32.1 (L) 05/31/2023   MCV 96.4 05/31/2023   PLT 315 05/31/2023   Most recent BMP    Latest Ref Rng & Units 06/01/2023    3:06 AM  BMP  Glucose 70 - 99 mg/dL 829   BUN 6 - 20 mg/dL 22   Creatinine 5.62 - 1.00 mg/dL 1.30   Sodium 865 - 784 mmol/L 139   Potassium 3.5 - 5.1 mmol/L 4.0   Chloride 98 - 111 mmol/L 93   CO2 22 - 32 mmol/L 33   Calcium 8.9 - 10.3 mg/dL 8.5    Imaging/Diagnostic Tests: none  Calvin Chura, DO 06/01/2023, 11:10 AM  PGY-1, [facility] FPTS Intern pager: [phone], text pages welcome Secure chat group [facility] Teaching Service

## 2023-06-01 NOTE — Assessment & Plan Note (Addendum)
Suspect this to be main contributing factor to patient's shortness of breath, probably exacerbated by tachycardia - BNP 721 on admission, chest x-ray suspicious for pulmonary edema - Cardiology following, appreciate recs - Strict I's and O's.  Net output -780 yesterday. Foley in place. Pt did not receive lasix yesterday - Lasix 80 mg IV TID initially, holding for now per cards 2/2 diarrhea - K 4.0 this morning - Goal K >4, Mg >2

## 2023-06-01 NOTE — Assessment & Plan Note (Signed)
-   pulmicort nebulizer BID - try to wean down to home O2 5L - O2 goal 88-92%

## 2023-06-01 NOTE — Assessment & Plan Note (Signed)
Iron panel with iron of 13, decreased TIBC 392, decreased ferritin 29. Pt takes ferrous sulfate 325mg  every day at facility. Hemoglobin stable at 9.7 - home ferrous sulfate 325mg  daily - fereheme IV x1

## 2023-06-01 NOTE — Assessment & Plan Note (Signed)
Reported that pt has been having some anxiety at night  - atarax 25mg  TID PRN - restarted home wellbutrin 100mg  bid

## 2023-06-01 NOTE — Progress Notes (Addendum)
   Patient Name: Chelsea [name] Date of Encounter: 06/01/2023 [facility] Cardiologist: Donato Schultz, MD   Interval Summary  .    No chest pain, feeling better  Vital Signs .    Vitals:   06/01/23 0409 06/01/23 0417 06/01/23 0744 06/01/23 0817  BP:    (!) 102/56  Pulse:    77  Resp:  15  (!) 22  Temp:    97.6 F (36.4 C)  TempSrc:    Oral  SpO2:  93% 91% 91%  Weight: (!) 154.6 kg       Intake/Output Summary (Last 24 hours) at 06/01/2023 0921 Last data filed at 06/01/2023 0826 Gross per 24 hour  Intake 920 ml  Output 1700 ml  Net -780 ml      06/01/2023    4:09 AM 05/31/2023    3:45 AM 05/30/2023    4:54 AM  Last 3 Weights  Weight (lbs) 340 lb 13.3 oz 338 lb 3 oz 345 lb 7.4 oz  Weight (kg) 154.6 kg 153.4 kg 156.7 kg      Telemetry/ECG    Normal sinus rhythm- Personally Reviewed  EKG 05/31/2023-sinus rhythm rate 85, nonspecific ST segment sagging V4 through V6  Physical Exam .   GEN: No acute distress.  Laying in bed with sheet covering [place] O2 Normal respiratory effort Thick neck Chronic LE edema  Assessment & Plan .     57 year old with chronic respiratory failure chronic O2 5 L obesity hypoventilation syndrome prior history of tracheostomy GI bleed paroxysmal atrial fibrillation and flutter  Paroxysmal atrial fibrillation and flutter - Previously evaluated at [facility] in 2022 there was mention or consideration for ablation at some point however no follow-up. - She was in respiratory failure on arrival here and in atrial flutter with RVR treated with amiodarone initially with digoxin. - Her amiodarone obviously with her underlying respiratory needs not felt to be a longer term option.  However, we really do not have any better alternatives given her soft blood pressure and continued elevated heart rates which are deleterious. - Digoxin was discontinued due to lack of efficacy for her. - Now transition to amiodarone 200 mg twice  daily -Eliquis 5 mg twice a day -Ultimately I do not believe that she would be a candidate for ablative therapy given her morbid obesity with weight 338 pounds   Acute on chronic diastolic heart failure - IV Lasix sporadically administered.  Out net 986 cc yesterday  Aortic stenosis - Moderate on echocardiogram in 2023.  Repeat echocardiogram has been ordered.  Hyperlipidemia - Atorvastatin 20 mg a day   For questions or updates, please contact [facility] Please consult [email or web address] for contact info under        Signed, Donato Schultz, MD

## 2023-06-01 NOTE — Assessment & Plan Note (Addendum)
Patient maintaining heart rate in the 70s since yesterday. - Amiodarone loading dose x1 and digoxin x1 on admission for a flutter with RVR, with several additional digoxin doses and put on amiodarone drip. - transitioned to amiodarone 200mg  BID - Dig was discontinued - Cardiology following, appreciate recs - Cardiology holding lasix for now due to diarrhea - Echo to evaluate murmur and LVEF/RVEF today - continue home eliquis 5mg . Holding home metoprolol

## 2023-06-01 NOTE — Plan of Care (Signed)
  Problem: Elimination: Goal: Will not experience complications related to urinary retention Outcome: Progressing  Purwick in place Problem: Pain Managment: Goal: General experience of comfort will improve Outcome: Progressing  Chronic pain un able to medicate due to low Blood pressures  Problem: Safety: Goal: Ability to remain free from injury will improve Outcome: Progressing   Problem: Skin Integrity: Goal: Risk for impaired skin integrity will decrease Outcome: Progressing

## 2023-06-01 NOTE — Progress Notes (Signed)
FMTS Brief Progress Note  S:Went bedside to see patient. Patient laying comfortably in bed. NAD.   O: BP (!) 96/44 (BP Location: Left Arm)   Pulse 77   Temp 98.1 F (36.7 C) (Oral)   Resp 20   Ht 5\' 5"  (1.651 m)   Wt (!) 154.6 kg   SpO2 90%   BMI 56.72 kg/m   MAP  69 in room General: Obese, NAD,  Resp: CTABL Card: S1/S2, RRR, systolic murmur 2/6 Ext: Non pitting edema  A/P: Low BP Patient continues to have soft BP throughout day. MAP 69 presently, when checked in room. Will continue to monitor. Cannot offer fluid bolus given CHF, will likely recommend midodrine if worsening MAP.   A. Flutter w/ RVR Plans per day team  CHF Plans per day team  - Orders reviewed. Labs for AM not ordered, which was adjusted as needed.   Bess Kinds, MD 06/01/2023, 8:55 PM PGY-3, [facility] Night Resident  Please page [phone] with questions.

## 2023-06-01 NOTE — Assessment & Plan Note (Signed)
Fasting BGL this morning 140, 3 units novolog given - resistant sliding scale - jardiance 10mg  daily restarted  - 5U semglee  - continue CBG monitoring before meals and at bedtime for now

## 2023-06-01 NOTE — Progress Notes (Signed)
Echocardiogram 2D Echocardiogram has been performed.  Chelsea [name] 06/01/2023, 11:44 AM

## 2023-06-01 NOTE — Plan of Care (Signed)

## 2023-06-02 DIAGNOSIS — I5033 Acute on chronic diastolic (congestive) heart failure: Secondary | ICD-10-CM | POA: Diagnosis not present

## 2023-06-02 DIAGNOSIS — I4892 Unspecified atrial flutter: Secondary | ICD-10-CM | POA: Diagnosis not present

## 2023-06-02 LAB — RENAL FUNCTION PANEL
Albumin: 2.8 g/dL — ABNORMAL LOW (ref 3.5–5.0)
Anion gap: 10 (ref 5–15)
BUN: 20 mg/dL (ref 6–20)
CO2: 34 mmol/L — ABNORMAL HIGH (ref 22–32)
Calcium: 8.6 mg/dL — ABNORMAL LOW (ref 8.9–10.3)
Chloride: 92 mmol/L — ABNORMAL LOW (ref 98–111)
Creatinine, Ser: 0.9 mg/dL (ref 0.44–1.00)
GFR, Estimated: >60 mL/min (ref >60)
Glucose, Bld: 180 mg/dL — ABNORMAL HIGH (ref 70–99)
Phosphorus: 4.7 mg/dL — ABNORMAL HIGH (ref 2.5–4.6)
Potassium: 4.5 mmol/L (ref 3.5–5.1)
Sodium: 136 mmol/L (ref 135–145)

## 2023-06-02 LAB — GLUCOSE, CAPILLARY
Glucose-Capillary: 149 mg/dL — ABNORMAL HIGH (ref 70–99)
Glucose-Capillary: 153 mg/dL — ABNORMAL HIGH (ref 70–99)
Glucose-Capillary: 188 mg/dL — ABNORMAL HIGH (ref 70–99)
Glucose-Capillary: 166 mg/dL — ABNORMAL HIGH (ref 70–99)

## 2023-06-02 LAB — MAGNESIUM: Magnesium: 2.8 mg/dL — ABNORMAL HIGH (ref 1.7–2.4)

## 2023-06-02 MED ORDER — ROPINIROLE HCL 0.25 MG PO TABS
0.2500 mg | ORAL_TABLET | Freq: Every day | ORAL | Status: DC
  Filled 2023-06-02: qty 1

## 2023-06-02 MED ORDER — KETOROLAC TROMETHAMINE 15 MG/ML IJ SOLN
7.5000 mg | Freq: Once | INTRAMUSCULAR | Status: AC
  Administered 2023-06-02: 7.5 mg via INTRAVENOUS
  Filled 2023-06-02: qty 1

## 2023-06-02 MED ORDER — ROPINIROLE HCL 0.25 MG PO TABS: 0.2500 mg | ORAL_TABLET | Freq: Every day | ORAL | Status: DC

## 2023-06-02 MED ORDER — ROPINIROLE HCL 0.25 MG PO TABS
0.2500 mg | ORAL_TABLET | Freq: Every day | ORAL | Status: DC
  Administered 2023-06-02 – 2023-06-07 (×6): 0.25 mg via ORAL
  Filled 2023-06-02 (×8): qty 1

## 2023-06-02 MED ORDER — FUROSEMIDE 10 MG/ML IJ SOLN
80.0000 mg | Freq: Once | INTRAMUSCULAR | Status: AC
  Administered 2023-06-02: 80 mg via INTRAVENOUS
  Filled 2023-06-02: qty 8

## 2023-06-02 MED ORDER — ACETAMINOPHEN 500 MG PO TABS
1000.0000 mg | ORAL_TABLET | Freq: Once | ORAL | Status: AC
  Administered 2023-06-02: 1000 mg via ORAL
  Filled 2023-06-02: qty 2

## 2023-06-02 MED ORDER — AMIODARONE HCL 200 MG PO TABS
200.0000 mg | ORAL_TABLET | Freq: Two times a day (BID) | ORAL | Status: DC
  Administered 2023-06-03 – 2023-06-08 (×11): 200 mg via ORAL
  Filled 2023-06-02 (×11): qty 1

## 2023-06-02 NOTE — Plan of Care (Signed)

## 2023-06-02 NOTE — Progress Notes (Addendum)
Daily Progress Note Intern Pager: [phone]  Patient name: Chelsea [name] Medical record number: [id number] Date of birth: 1966-02-29 Age: 57 y.o. Gender: female  Primary Care Provider: Pcp, No Consultants: Cardiology Code Status: Full code   Pt Overview and Major Events to Date:  8/27 - admitted  8/30 - converted to NSR   Assessment and Plan:   57 year old female with past medical history of chronic respiratory failure requiring 5 L oxygen at home, COPD, HFpEF, PAF, tracheostomy (reverted in 2023) presenting initially for tachycardia, increased shortness of breath, intermittent chest pain. Patient has had multiple admissions for similar symptoms.   Cardiology following and providing recommendations.   Assessment & Plan Atrial flutter with rapid ventricular response (HCC) Patient maintaining heart rate in the 70s. Echo performed LVEF 60-65% with mod AS - Amiodarone loading dose x1 and digoxin x1 on admission for a flutter with RVR, with several additional digoxin doses and put on amiodarone drip.  - transitioned to amiodarone 200mg  BID - Cardiology following, appreciate recs - Cardiology holding lasix for now due to diarrhea - continue home eliquis 5mg . Holding home metoprolol Acute on chronic diastolic CHF (congestive heart failure), NYHA class 3 (HCC) Suspect this to be main contributing factor to patient's shortness of breath, probably exacerbated by tachycardia - BNP 721 on admission, chest x-ray suspicious for pulmonary edema - Cardiology following, appreciate recs - Strict I's and O's.  Net output poorly documented.  - Lasix 80 mg IV TID initially, holding for now per cards 2/2 diarrhea - Goal K >4, Mg >2 Acute on chronic respiratory failure with hypoxia (HCC) Likely multifactorial. 8L this AM.  - NM perfusion scan negative for PE - Patient requires 5L at home  - Has history of trach placed in July 2023, can consult CCM for respiratory support if condition  changes Diarrhea She does note that she has IBS and will often get diarrhea if she goes too long without eating.  -Continue to monitor this -Patient is afebrile, no abdominal pain or distention. Restless leg syndrome - gabapentin 300mg  TID  - Restarted home ferrous sulfate 325mg  daily - Percocet 5-325mg  Q6h PRN for now - Could consider requip in the future Chronic iron deficiency anemia Iron panel with iron of 13, decreased TIBC 392, decreased ferritin 29. Pt takes ferrous sulfate 325mg  every day at facility. Hemoglobin stable at 9.7 - home ferrous sulfate 325mg  daily - fereheme IV x1 Anxiety Reported that pt has been having some anxiety at night, notes that she has Xanax at home?? - atarax 25mg  TID PRN - wellbutrin 100mg  bid  DM2 (diabetes mellitus, type 2) (HCC) 140 this AM - resistant sliding scale - jardiance 10mg  daily restarted  - 5U semglee  - continue CBG monitoring before meals and at bedtime for now COPD (chronic obstructive pulmonary disease) with chronic bronchitis - pulmicort nebulizer BID - try to wean down to home O2 5L - O2 goal 88-92%    Chronic and Stable Problems:   Hypothyroidism - levothyroxine daily HTN - torsemide 40mg  bid, spironolactone 25mg  daily at home  HLD - atorvastatin 20mg  every day OSA - Bipap    FEN/GI: Heart healthy diet PPx: eliquis Dispo: LTC pending cardiology recommendations and improving respiratory distress     Subjective:  Reports that she has worsening RLS, takes Xanax at home for anxiety but not getting here.   Objective: Temp:  [97.8 F (36.6 C)-98.7 F (37.1 C)] 97.9 F (36.6 C) (09/01 0724) Pulse Rate:  [  72-79] 75 (09/01 0827) Resp:  [17-25] 22 (09/01 0827) BP: (88-108)/(44-76) 93/63 (09/01 0724) SpO2:  [90 %-96 %] 92 % (09/01 0827) FiO2 (%):  [50 %] 50 % (09/01 0323) Weight:  [154.6 kg-155.4 kg] 155.4 kg (09/01 0427) General: Alert and oriented in no apparent distress Heart: Regular rate and rhythm with no  murmurs appreciated Lungs: CTA bilaterally, no wheezing Abdomen: Bowel sounds present, no abdominal pain, obese Skin: Warm and dry Extremities: 2+ pitting edema with chronic skin changes    Laboratory: Most recent CBC Lab Results  Component Value Date   WBC 7.1 05/31/2023   HGB 9.7 (L) 05/31/2023   HCT 32.1 (L) 05/31/2023   MCV 96.4 05/31/2023   PLT 315 05/31/2023   Most recent BMP    Latest Ref Rng & Units 06/01/2023    3:06 AM  BMP  Glucose 70 - 99 mg/dL 621   BUN 6 - 20 mg/dL 22   Creatinine 3.08 - 1.00 mg/dL 6.57   Sodium 846 - 962 mmol/L 139   Potassium 3.5 - 5.1 mmol/L 4.0   Chloride 98 - 111 mmol/L 93   CO2 22 - 32 mmol/L 33   Calcium 8.9 - 10.3 mg/dL 8.5      Alfredo Martinez, MD 06/02/2023, 9:51 AM  PGY-3,  Family Medicine FPTS Intern pager: [phone], text pages welcome Secure chat group [facility] Teaching Service

## 2023-06-02 NOTE — Assessment & Plan Note (Signed)
Patient maintaining heart rate in the 70s. Echo performed LVEF 60-65% with mod AS - Amiodarone loading dose x1 and digoxin x1 on admission for a flutter with RVR, with several additional digoxin doses and put on amiodarone drip.  - transitioned to amiodarone 200mg  BID - Cardiology following, appreciate recs - Cardiology holding lasix for now due to diarrhea - continue home eliquis 5mg . Holding home metoprolol

## 2023-06-02 NOTE — Assessment & Plan Note (Signed)
-   gabapentin 300mg  TID  - Restarted home ferrous sulfate 325mg  daily - Percocet 5-325mg  Q6h PRN for now - Could consider requip in the future

## 2023-06-02 NOTE — Assessment & Plan Note (Signed)
140 this AM - resistant sliding scale - jardiance 10mg  daily restarted  - 5U semglee  - continue CBG monitoring before meals and at bedtime for now

## 2023-06-02 NOTE — Plan of Care (Signed)
  Problem: Health Behavior/Discharge Planning: Goal: Ability to identify and utilize available resources and services will improve Outcome: Progressing   Problem: Metabolic: Goal: Ability to maintain appropriate glucose levels will improve Outcome: Progressing   Problem: Nutritional: Goal: Maintenance of adequate nutrition will improve Outcome: Progressing   Problem: Education: Goal: Knowledge of General Education information will improve Description: Including pain rating scale, medication(s)/side effects and non-pharmacologic comfort measures Outcome: Progressing   Problem: Health Behavior/Discharge Planning: Goal: Ability to manage health-related needs will improve Outcome: Progressing   Problem: Clinical Measurements: Goal: Ability to maintain clinical measurements within normal limits will improve Outcome: Progressing Goal: Will remain free from infection Outcome: Progressing

## 2023-06-02 NOTE — Assessment & Plan Note (Signed)
Reported that pt has been having some anxiety at night, notes that she has Xanax at home?? - atarax 25mg  TID PRN - wellbutrin 100mg  bid

## 2023-06-02 NOTE — Progress Notes (Signed)
Notified Dr. Jena Gauss that patient is requesting to take Requip now instead of at bedtime as ordered. Dr. Jena Gauss states that patient may take Requip now. Pharmacy notified that order was changed and states they will send up medication.

## 2023-06-02 NOTE — Assessment & Plan Note (Addendum)
Suspect this to be main contributing factor to patient's shortness of breath, probably exacerbated by tachycardia - BNP 721 on admission, chest x-ray suspicious for pulmonary edema - Cardiology following, appreciate recs - Strict I's and O's.  Net output poorly documented.  - Lasix 80 mg IV TID initially, holding for now per cards 2/2 diarrhea - Goal K >4, Mg >2

## 2023-06-02 NOTE — Assessment & Plan Note (Signed)
She does note that she has IBS and will often get diarrhea if she goes too long without eating.  -Continue to monitor this -Patient is afebrile, no abdominal pain or distention.

## 2023-06-02 NOTE — Progress Notes (Signed)
  Cardiology Office Note:  .   Date:  06/02/2023  ID:  Chelsea [name], DOB Feb 19, 1966, MRN [id number] PCP: Pcp, No  [facility] Providers Cardiologist:  Donato Schultz, MD    History of Present Illness: .   Chelsea [name] is a 58 y.o. female with morbid obesity paroxysmal atrial fibrillation flutter acute on chronic diastolic heart failure.  Currently improving.  She has had diarrhea.  Holding Lasix.  ROS: No chest pain  Studies Reviewed: Marland Kitchen   EKG Interpretation Date/Time:  Wednesday May 29 2023 00:35:28 EDT Ventricular Rate:  50 PR Interval:  166 QRS Duration:  90 QT Interval:  512 QTC Calculation: 467 R Axis:   19  Text Interpretation: Atrial fibrillation Anterior infarct, age indeterminate Baseline wander in lead(s) V1 Confirmed by Gilda Crease [phone]) on 05/30/2023 3:01:58 PM    Sinus rhythm Risk Assessment/Calculations:            Physical Exam:   VS:  BP 93/63 (BP Location: Left Wrist)   Pulse 75   Temp 97.9 F (36.6 C) (Oral)   Resp (!) 22   Ht 5\' 5"  (1.651 m)   Wt (!) 155.4 kg   SpO2 92%   BMI 57.01 kg/m    Wt Readings from Last 3 Encounters:  06/02/23 (!) 155.4 kg  12/31/22 (!) 139.8 kg  10/02/22 (!) 139.8 kg    GEN: Well nourished, well developed in no acute distress, laying in bed obese with sheet over her body NECK: No JVD; No carotid bruits CARDIAC: RRR, 2/6 systolic murmur, no rubs, gallops RESPIRATORY:  Clear to auscultation without rales, wheezing or rhonchi  ABDOMEN: Soft, non-tender, non-distended EXTREMITIES:  No edema; No deformity   ASSESSMENT AND PLAN: .    57 year old with atrial flutter morbid obesity acute on chronic diastolic heart failure class III diarrhea chronic iron deficiency anemia anxiety  -Continue with amiodarone 200 mg twice daily for the next 2 weeks and then transition to 200 mg a day thereafter.  Chronic anticoagulation - Eliquis  Resume Lasix when able to from diarrheal  standpoint.  Aortic stenosis - Moderate on echocardiogram EF 60  No new recommendations      Signed, Donato Schultz, MD

## 2023-06-02 NOTE — Progress Notes (Signed)
Notified Dr. Jena Gauss that patient continues to request order for pain medication that will not decrease BP. One-time order received for Toradol.

## 2023-06-02 NOTE — Progress Notes (Signed)
Notified Dr. Jena Gauss that patient is requesting order for pain medication that will not decrease BP. Order received for Requip at bedtime.

## 2023-06-02 NOTE — Assessment & Plan Note (Signed)
Iron panel with iron of 13, decreased TIBC 392, decreased ferritin 29. Pt takes ferrous sulfate 325mg  every day at facility. Hemoglobin stable at 9.7 - home ferrous sulfate 325mg  daily - fereheme IV x1

## 2023-06-02 NOTE — Assessment & Plan Note (Signed)
-   pulmicort nebulizer BID - try to wean down to home O2 5L - O2 goal 88-92%

## 2023-06-02 NOTE — Assessment & Plan Note (Signed)
Likely multifactorial. 8L this AM.  - NM perfusion scan negative for PE - Patient requires 5L at home  - Has history of trach placed in July 2023, can consult CCM for respiratory support if condition changes

## 2023-06-03 ENCOUNTER — Inpatient Hospital Stay (HOSPITAL_COMMUNITY): Payer: Medicare Other

## 2023-06-03 ENCOUNTER — Encounter (HOSPITAL_COMMUNITY): Payer: Self-pay

## 2023-06-03 DIAGNOSIS — J4489 Other specified chronic obstructive pulmonary disease: Secondary | ICD-10-CM | POA: Diagnosis not present

## 2023-06-03 DIAGNOSIS — I4892 Unspecified atrial flutter: Secondary | ICD-10-CM | POA: Diagnosis not present

## 2023-06-03 DIAGNOSIS — I5033 Acute on chronic diastolic (congestive) heart failure: Secondary | ICD-10-CM | POA: Diagnosis not present

## 2023-06-03 DIAGNOSIS — R058 Other specified cough: Secondary | ICD-10-CM

## 2023-06-03 HISTORY — DX: Other specified cough: R05.8

## 2023-06-03 LAB — RESPIRATORY PANEL BY PCR

## 2023-06-03 LAB — BASIC METABOLIC PANEL
Anion gap: 10 (ref 5–15)
BUN: 22 mg/dL — ABNORMAL HIGH (ref 6–20)
CO2: 34 mmol/L — ABNORMAL HIGH (ref 22–32)
Calcium: 8.3 mg/dL — ABNORMAL LOW (ref 8.9–10.3)
Chloride: 94 mmol/L — ABNORMAL LOW (ref 98–111)
Creatinine, Ser: 0.96 mg/dL (ref 0.44–1.00)
GFR, Estimated: >60 mL/min (ref >60)
Glucose, Bld: 146 mg/dL — ABNORMAL HIGH (ref 70–99)
Potassium: 4.4 mmol/L (ref 3.5–5.1)
Sodium: 138 mmol/L (ref 135–145)

## 2023-06-03 LAB — MAGNESIUM: Magnesium: 2.7 mg/dL — ABNORMAL HIGH (ref 1.7–2.4)

## 2023-06-03 LAB — GLUCOSE, CAPILLARY
Glucose-Capillary: 142 mg/dL — ABNORMAL HIGH (ref 70–99)
Glucose-Capillary: 177 mg/dL — ABNORMAL HIGH (ref 70–99)
Glucose-Capillary: 156 mg/dL — ABNORMAL HIGH (ref 70–99)
Glucose-Capillary: 149 mg/dL — ABNORMAL HIGH (ref 70–99)

## 2023-06-03 LAB — PHOSPHORUS: Phosphorus: 5.2 mg/dL — ABNORMAL HIGH (ref 2.5–4.6)

## 2023-06-03 MED ORDER — SPIRONOLACTONE 25 MG PO TABS
25.0000 mg | ORAL_TABLET | Freq: Every day | ORAL | Status: DC
  Administered 2023-06-03 – 2023-06-08 (×6): 25 mg via ORAL
  Filled 2023-06-03 (×6): qty 1

## 2023-06-03 MED ORDER — KETOROLAC TROMETHAMINE 15 MG/ML IJ SOLN
7.5000 mg | Freq: Four times a day (QID) | INTRAMUSCULAR | Status: AC | PRN
  Administered 2023-06-03 – 2023-06-04 (×2): 7.5 mg via INTRAVENOUS
  Filled 2023-06-03 (×2): qty 1

## 2023-06-03 MED ORDER — KETOROLAC TROMETHAMINE 15 MG/ML IJ SOLN: 15.0000 mg | Freq: Four times a day (QID) | INTRAMUSCULAR | Status: DC | PRN

## 2023-06-03 MED ORDER — KETOROLAC TROMETHAMINE 15 MG/ML IJ SOLN
7.5000 mg | Freq: Four times a day (QID) | INTRAMUSCULAR | Status: AC | PRN
  Administered 2023-06-03: 7.5 mg via INTRAVENOUS
  Filled 2023-06-03: qty 1

## 2023-06-03 MED ORDER — TORSEMIDE 20 MG PO TABS
40.0000 mg | ORAL_TABLET | Freq: Two times a day (BID) | ORAL | Status: DC
  Administered 2023-06-03 – 2023-06-04 (×3): 40 mg via ORAL
  Filled 2023-06-03 (×3): qty 2

## 2023-06-03 MED ORDER — CALCIUM ACETATE (PHOS BINDER) 667 MG PO CAPS
667.0000 mg | ORAL_CAPSULE | Freq: Three times a day (TID) | ORAL | Status: AC
  Administered 2023-06-03 – 2023-06-04 (×2): 667 mg via ORAL
  Filled 2023-06-03 (×2): qty 1

## 2023-06-03 NOTE — Progress Notes (Signed)
   Patient Name: Chelsea [name] Date of Encounter: 06/03/2023 [facility] Cardiologist: Donato Schultz, MD   Interval Summary  .    Received Toradol for leg discomfort, complained of mucus production  Vital Signs .    Vitals:   06/03/23 0300 06/03/23 0433 06/03/23 0752 06/03/23 0755  BP:  97/70 102/75   Pulse:   69 67  Resp: 19 17 20 20   Temp:  97.6 F (36.4 C) 97.6 F (36.4 C)   TempSrc:  Oral Oral   SpO2: 95% 95% 95%   Weight:  (!) 156.2 kg    Height:        Intake/Output Summary (Last 24 hours) at 06/03/2023 0821 Last data filed at 06/03/2023 0003 Gross per 24 hour  Intake 714 ml  Output 2000 ml  Net -1286 ml      06/03/2023    4:33 AM 06/02/2023    4:27 AM 06/01/2023    6:29 PM  Last 3 Weights  Weight (lbs) 344 lb 5.7 oz 342 lb 9.5 oz 340 lb 13.3 oz  Weight (kg) 156.2 kg 155.4 kg 154.6 kg      Telemetry/ECG    Sinus rhythm- Personally Reviewed  Prior EKG showed A-fib  Echo EF 65% normal RV left atrium mildly to moderately dilated, moderate aortic stenosis no change from prior  Physical Exam .   GEN: No acute distress.  Obese in bed Neck: No JVD Cardiac: RRR, 2/6 murmur, rubs, or gallops.  Respiratory: Clear to auscultation bilaterally. GI: Soft, nontender, non-distended  MS: No edema  Assessment & Plan .     Paroxysmal atrial fibrillation/flutter - On Eliquis for chronic anticoagulation - On amiodarone 200 mg twice a day for total of 2 weeks then transitioning to 200 mg a day thereafter -Had trouble with traditional medications secondary to hypotension. -Would not be a good candidate for ablation given her current body habitus.  Chronic diastolic heart failure secondary to morbid obesity obesity hypoventilation syndrome - I will go ahead and resume her maintenance diuresis as per home medications.  Spironolactone 25 mg a day, torsemide 40 mg twice a day  Aortic stenosis - Moderate on echocardiogram -In part contributing to diastolic heart  failure but not severe and not requiring replacement.  It is possible that given her current medical status, obesity that she would not be a candidate for valve replacement in the future.  Diarrhea - Lasix IV was held previously secondary to diarrhea.   For questions or updates, please contact [facility] Please consult [email or web address] for contact info under        Signed, Donato Schultz, MD

## 2023-06-03 NOTE — Assessment & Plan Note (Signed)
-   pulmicort nebulizer BID - try to wean down to home O2 5L - O2 goal 88-92%

## 2023-06-03 NOTE — Progress Notes (Signed)
Patient not in room at this time. BiPAP on stand-by in the room.

## 2023-06-03 NOTE — Assessment & Plan Note (Signed)
Iron panel with iron of 13, decreased TIBC 392, decreased ferritin 29. Pt takes ferrous sulfate 325mg  every day at facility. Hemoglobin stable at 9.7 - home ferrous sulfate 325mg  daily - fereheme IV x1

## 2023-06-03 NOTE — Assessment & Plan Note (Addendum)
Suspect this to be main contributing factor to patient's shortness of breath, probably exacerbated by tachycardia - BNP 721 on admission, chest x-ray suspicious for pulmonary edema - Cardiology following, appreciate recs - Strict I's and O's.  Net output poorly documented.  - home spironolactone 25mg  and torsemide 40mg  BID restarted per cards - Lasix 80 mg IV TID initially, holding for now per cards 2/2 diarrhea (now resolved) - Patient received Lasix 80 x 1 yesterday - Goal K >4, Mg >2 - Magnesium 2.7 - Phosphorus 5.2 - phoslo given, will recheck - check CK - assessing for rhabdomyolysis due to amiodarone

## 2023-06-03 NOTE — Assessment & Plan Note (Addendum)
Began having cough with clear sputum last night. Differentials include COPD exacerbation, viral illness, pneumonia.  -Chest x-ray with possible atelectasis or infiltrate in L lower lung field, appears unchanged from previous  -Pulmonary toiletry, flutter valve, incentive spirometry today -Respiratory viral panel ordered -consider PT/OT when RVP results

## 2023-06-03 NOTE — Assessment & Plan Note (Addendum)
-   gabapentin 300mg  TID  - Restarted home ferrous sulfate 325mg  daily - Requip 0.25mg  daily at bedtime - Percocet 5-325mg  Q6h PRN for now

## 2023-06-03 NOTE — Assessment & Plan Note (Addendum)
142 this AM, 3U novolog given - resistant sliding scale - jardiance 10mg  daily restarted  - 5U semglee  - continue CBG monitoring before meals and at bedtime for now

## 2023-06-03 NOTE — Evaluation (Signed)
Occupational Therapy Evaluation Patient Details Name: Chelsea [name] MRN: [id number] DOB: 1966-07-17 Today's Date: 06/03/2023   History of Present Illness 57 yo female presents to [facility] on 8/27 with SOB, admitted for aflutter and HF exacerbation. PMH includes: chronic respiratory failure with hypoxia on 5LO2 chronically and bipap at night, CHF, CAD, COPD, OSA on CPAP, afib, HLD, HTN, DM II, morbid obesity, cervical cancer, and chronic back pain.   Clinical Impression   Pt currently at total assist to total +2 for supine to sit EOB with overall mod assist for brushing her hair and supervision for washing her face in sitting.  Oxygen sats on 6.5 Ls at 91% but decreasing down to 89% with activity.  Unable to attempt standing at this time or scooting up EOB secondary to LE knee flexion limitations.  Prior to admission, pt reports that she has been at [facility] for almost a year for continued rehab.  Her LTG is to get stronger so she can go home and be with her family again.  At [facility] she was reliant on staff to lift her OOB with a hoyer for transfers and assist with all selfcare tasks.  Feel she will benefit from acute care OT at this time in order to help decrease burden of care and start progression back to sit to stand ADLs.  Recommend patient will benefit from continued inpatient follow up therapy, <3 hours/day.           If plan is discharge home, recommend the following: Two people to help with walking and/or transfers;Two people to help with bathing/dressing/bathroom;Assist for transportation;Assistance with cooking/housework;Help with stairs or ramp for entrance    Functional Status Assessment  Patient has had a recent decline in their functional status and demonstrates the ability to make significant improvements in function in a reasonable and predictable amount of time.  Equipment Recommendations  None recommended by OT       Precautions / Restrictions  Precautions Precautions: Fall Precaution Comments: left arm limitations Restrictions Weight Bearing Restrictions: No      Mobility Bed Mobility Overal bed mobility: Needs Assistance Bed Mobility: Rolling, Supine to Sit, Sit to Supine Rolling: Mod assist   Supine to sit: Max assist, HOB elevated, Used rails Sit to supine: Total assist   General bed mobility comments: Pt able to roll to the left with mod assist,did not attempt roll to the right secondary to left arm limitations and patient preferring to roll to the left.  Assist needed for advance LEs to the EOB and for bringing trunk up to sitting.    Transfers                   General transfer comment: unable      Balance Overall balance assessment: Needs assistance Sitting-balance support: Feet supported, No upper extremity supported Sitting balance-Leahy Scale: Fair Sitting balance - Comments: Pt able to sit statically without LOB during grooming tasks.       Standing balance comment: not attempted secondary to safety issues                           ADL either performed or assessed with clinical judgement   ADL Overall ADL's : Needs assistance/impaired Eating/Feeding: Modified independent;Sitting   Grooming: Wash/dry hands;Wash/dry face;Sitting;Set up   Upper Body Bathing: Minimal assistance;Sitting Upper Body Bathing Details (indicate cue type and reason): simulated Lower Body Bathing: Total assistance;+2 for physical assistance Lower Body Bathing Details (  indicate cue type and reason): sit to supine simulated Upper Body Dressing : Minimal assistance;Sitting Upper Body Dressing Details (indicate cue type and reason): simulated for hospital gown Lower Body Dressing: Total assistance;+2 for physical assistance;Bed level Lower Body Dressing Details (indicate cue type and reason): sit to supine     Toileting- Clothing Manipulation and Hygiene: Total assistance;Bed level Toileting - Clothing  Manipulation Details (indicate cue type and reason): simulated     Functional mobility during ADLs: Maximal assistance;+2 for physical assistance (supine to sit EOB) General ADL Comments: At [facility] they get her up to the wheelchair with the hoyer and she would use the bed pan for toileting.  Pt reports standing in the parallel bars occasionally in the past but unsure of last attempt.  Oxygen on 6.5ls 88-91%.  HR 105 BPM sitting EOB with BP at 110/98 and 125/73.  Total assist for transition back to supine and for positioning pt on the bed pan.     Vision Baseline Vision/History: 1 Wears glasses Ability to See in Adequate Light: 0 Adequate Patient Visual Report: No change from baseline Vision Assessment?: No apparent visual deficits     Perception Perception: Not tested       Praxis Praxis: Not tested       Pertinent Vitals/Pain Pain Assessment Pain Assessment: No/denies pain     Extremity/Trunk Assessment Upper Extremity Assessment Upper Extremity Assessment: LUE deficits/detail LUE Deficits / Details: Limitations at shoulder and hand with AROM.  Shoulder flexion AROM in supine 0-50 degrees with AAROM approximately 0-70 degrees.  Swan neck deformity noted in 3rd digit with decreased PROM MP, PIP, or DIP flexion.  Overall decreased flexion throughout other digits as well to only approximately 50% of normal.  Elbow AROM WFLs to bring hand to nose.  Strength in elbow not tested.   Lower Extremity Assessment Lower Extremity Assessment: Defer to PT evaluation   Cervical / Trunk Assessment Cervical / Trunk Assessment: Other exceptions Cervical / Trunk Exceptions: body habitus   Communication Communication Communication: No apparent difficulties   Cognition Arousal: Alert Behavior During Therapy: WFL for tasks assessed/performed Overall Cognitive Status: Within Functional Limits for tasks assessed                                                  Home  Living Family/patient expects to be discharged to:: Skilled nursing facility                                 Additional Comments: pt is a resident of [facility]      Prior Functioning/Environment Prior Level of Function : Needs assist       Physical Assist : Mobility (physical);ADLs (physical) Mobility (physical): Bed mobility;Transfers   Mobility Comments: assisted for bed mobility, hoyer lift to chair ADLs Comments: Pt requires assistance with bathing, dressing, hygiene tasks, IADLs        OT Problem List: Decreased strength;Impaired balance (sitting and/or standing);Cardiopulmonary status limiting activity;Decreased activity tolerance;Decreased knowledge of use of DME or AE;Pain      OT Treatment/Interventions: Self-care/ADL training;Patient/family education;Balance training;Therapeutic activities;DME and/or AE instruction;Energy conservation;Therapeutic exercise    OT Goals(Current goals can be found in the care plan section) Acute Rehab OT Goals Patient Stated Goal: Pt wants to get back home to be with  her family when she gets stronger OT Goal Formulation: With patient Time For Goal Achievement: 06/17/23 Potential to Achieve Goals: Good  OT Frequency: Min 1X/week       AM-PAC OT "6 Clicks" Daily Activity     Outcome Measure Help from another person eating meals?: None Help from another person taking care of personal grooming?: A Little Help from another person toileting, which includes using toliet, bedpan, or urinal?: Total Help from another person bathing (including washing, rinsing, drying)?: Total Help from another person to put on and taking off regular upper body clothing?: A Lot Help from another person to put on and taking off regular lower body clothing?: Total 6 Click Score: 12   End of Session Equipment Utilized During Treatment: Oxygen Nurse Communication: Mobility status  Activity Tolerance: Patient tolerated treatment  well Patient left: in bed;with call bell/phone within reach  OT Visit Diagnosis: Unsteadiness on feet (R26.81);Other abnormalities of gait and mobility (R26.89);Muscle weakness (generalized) (M62.81)                Time: 4403-4742 OT Time Calculation (min): 47 min Charges:  OT General Charges $OT Visit: 1 Visit OT Evaluation $OT Eval Moderate Complexity: 1 Mod OT Treatments $Self Care/Home Management : 23-37 mins Perrin Maltese, OTR/L Acute Rehabilitation Services  Office [phone] 06/03/2023

## 2023-06-03 NOTE — Plan of Care (Signed)
Droplet isolation discontinued after respiratory panel negative. Participated in PT/OT today.  Medicated for pain, but with low systolic BP response.     Problem: Education: Goal: Ability to describe self-care measures that may prevent or decrease complications (Diabetes Survival Skills Education) will improve Outcome: Progressing Goal: Individualized Educational Video(s) Outcome: Progressing   Problem: Coping: Goal: Ability to adjust to condition or change in health will improve Outcome: Progressing   Problem: Fluid Volume: Goal: Ability to maintain a balanced intake and output will improve Outcome: Progressing   Problem: Health Behavior/Discharge Planning: Goal: Ability to identify and utilize available resources and services will improve Outcome: Progressing Goal: Ability to manage health-related needs will improve Outcome: Progressing   Problem: Metabolic: Goal: Ability to maintain appropriate glucose levels will improve Outcome: Progressing   Problem: Nutritional: Goal: Maintenance of adequate nutrition will improve Outcome: Progressing Goal: Progress toward achieving an optimal weight will improve Outcome: Progressing   Problem: Skin Integrity: Goal: Risk for impaired skin integrity will decrease Outcome: Progressing   Problem: Tissue Perfusion: Goal: Adequacy of tissue perfusion will improve Outcome: Progressing   Problem: Education: Goal: Knowledge of General Education information will improve Description: Including pain rating scale, medication(s)/side effects and non-pharmacologic comfort measures Outcome: Progressing   Problem: Health Behavior/Discharge Planning: Goal: Ability to manage health-related needs will improve Outcome: Progressing   Problem: Clinical Measurements: Goal: Ability to maintain clinical measurements within normal limits will improve Outcome: Progressing Goal: Will remain free from infection Outcome: Progressing Goal: Diagnostic  test results will improve Outcome: Progressing Goal: Respiratory complications will improve Outcome: Progressing Goal: Cardiovascular complication will be avoided Outcome: Progressing   Problem: Activity: Goal: Risk for activity intolerance will decrease Outcome: Progressing   Problem: Nutrition: Goal: Adequate nutrition will be maintained Outcome: Progressing   Problem: Coping: Goal: Level of anxiety will decrease Outcome: Progressing   Problem: Elimination: Goal: Will not experience complications related to bowel motility Outcome: Progressing Goal: Will not experience complications related to urinary retention Outcome: Progressing   Problem: Pain Managment: Goal: General experience of comfort will improve Outcome: Progressing   Problem: Safety: Goal: Ability to remain free from injury will improve Outcome: Progressing   Problem: Skin Integrity: Goal: Risk for impaired skin integrity will decrease Outcome: Progressing

## 2023-06-03 NOTE — Plan of Care (Signed)
  Problem: Coping: Goal: Ability to adjust to condition or change in health will improve Outcome: Progressing   Problem: Fluid Volume: Goal: Ability to maintain a balanced intake and output will improve Outcome: Progressing   Problem: Metabolic: Goal: Ability to maintain appropriate glucose levels will improve Outcome: Progressing   Problem: Nutritional: Goal: Progress toward achieving an optimal weight will improve Outcome: Progressing   Problem: Skin Integrity: Goal: Risk for impaired skin integrity will decrease Outcome: Progressing

## 2023-06-03 NOTE — Progress Notes (Addendum)
   06/03/23 0045  BiPAP/CPAP/SIPAP  $ Non-Invasive Ventilator  Non-Invasive Vent Subsequent  BiPAP/CPAP/SIPAP Pt Type Adult  BiPAP/CPAP/SIPAP BIPAP  Mask Type Full face mask  Mask Size Extra large  Set Rate 12 breaths/min  Respiratory Rate 28 breaths/min  IPAP 14 cmH20  EPAP 6 cmH2O  FiO2 (%) 50 %  Leak 30  Patient Home Equipment No  Auto Titrate No  Press High Alarm 30 cmH2O  CPAP/SIPAP surface wiped down Yes  BiPAP/CPAP /SiPAP Vitals  Pulse Rate 75  Resp (!) 28  SpO2 96 %  MEWS Score/Color  MEWS Score 3  MEWS Score Color Yellow

## 2023-06-03 NOTE — Progress Notes (Signed)
Daily Progress Note Intern Pager: [phone]  Patient name: Chelsea [name] Medical record number: [id number] Date of birth: 1966-01-11 Age: 57 y.o. Gender: female  Primary Care Provider: Pcp, No Consultants: cardiology Code Status: full code  Pt Overview and Major Events to Date:  8/27 - admitted 8/30 - converted to NSR  Assessment and Plan:  57 year old female with past medical history of chronic respiratory failure requiring 5 L oxygen at home, COPD, HFpEF, PAF, tracheostomy (reverted in 2023) presenting initially for tachycardia, increased shortness of breath, intermittent chest pain. Patient has had multiple admissions for similar symptoms.   Cardiology is following patient and providing recommendations.  Patient began complaining of a cough with clear sputum last night, appears to be no acute process on chest x-ray.  Pt is afebrile. No increased SOB. Will do pulmonary toiletry, flutter valve, incentive spirometry today.  Respiratory viral panel also ordered. Will monitor cough.  With phosphorus 5.2, magnesium 2.7, concern for rhabdomyolysis 2/2 amiodarone. No muscle pain. CK ordered and pending. Phoslo given to correct phosphorus.  Assessment & Plan Atrial flutter with rapid ventricular response (HCC) Patient maintaining heart rate in the 70s. Echo performed LVEF 60-65% with mod AS - Amiodarone loading dose x1 and digoxin x1 on admission for a flutter with RVR, with several additional digoxin doses and put on amiodarone drip.  - transitioned to amiodarone 200mg  BID - Cardiology following, appreciate recs - continue home eliquis 5mg . Holding home metoprolol Acute on chronic diastolic CHF (congestive heart failure), NYHA class 3 (HCC) Suspect this to be main contributing factor to patient's shortness of breath, probably exacerbated by tachycardia - BNP 721 on admission, chest x-ray suspicious for pulmonary edema - Cardiology following, appreciate recs - Strict I's and  O's.  Net output poorly documented.  - home spironolactone 25mg  and torsemide 40mg  BID restarted per cards - Lasix 80 mg IV TID initially, holding for now per cards 2/2 diarrhea (now resolved) - Patient received Lasix 80 x 1 yesterday - Goal K >4, Mg >2 - Magnesium 2.7 - Phosphorus 5.2 - phoslo given, will recheck - check CK - assessing for rhabdomyolysis due to amiodarone  Cough productive of clear sputum Began having cough with clear sputum last night. Differentials include COPD exacerbation, viral illness, pneumonia.  -Chest x-ray with possible atelectasis or infiltrate in L lower lung field, appears unchanged from previous  -Pulmonary toiletry, flutter valve, incentive spirometry today -Respiratory viral panel ordered -consider PT/OT when RVP results Acute on chronic respiratory failure with hypoxia (HCC) Likely multifactorial. 6L this AM and satting 92%.  - NM perfusion scan negative for PE - Patient requires 5L at home  - Has history of trach placed in July 2023, can consult CCM for respiratory support if condition changes Restless leg syndrome - gabapentin 300mg  TID  - Restarted home ferrous sulfate 325mg  daily - Requip 0.25mg  daily at bedtime - Percocet 5-325mg  Q6h PRN for now Chronic iron deficiency anemia Iron panel with iron of 13, decreased TIBC 392, decreased ferritin 29. Pt takes ferrous sulfate 325mg  every day at facility. Hemoglobin stable at 9.7 - home ferrous sulfate 325mg  daily - fereheme IV x1 Anxiety Reported that pt has been having some anxiety at night - atarax 25mg  TID PRN - wellbutrin 100mg  bid  DM2 (diabetes mellitus, type 2) (HCC) 142 this AM, 3U novolog given - resistant sliding scale - jardiance 10mg  daily restarted  - 5U semglee  - continue CBG monitoring before meals and at bedtime for now COPD (  chronic obstructive pulmonary disease) with chronic bronchitis - pulmicort nebulizer BID - try to wean down to home O2 5L - O2 goal 88-92%  Chronic  and Stable Problems:  Hypothyroidism - levothyroxine daily HTN - torsemide 40mg  bid, spironolactone 25mg  daily at home  HLD - atorvastatin 20mg  every day OSA - Bipap   FEN/GI: Heart healthy PPx: eliquis Dispo: LTC  pending cardiology recs and improving respiratory status  Subjective:  Began having cough with clear sputum last night.  States she feels a tickle in her throat, causing her to cough. No chest pain, increased SOB, nausea, vomiting, diarrhea, or fevers.   Objective: Temp:  [97.6 F (36.4 C)-98.6 F (37 C)] 97.6 F (36.4 C) (09/02 0752) Pulse Rate:  [67-75] 67 (09/02 0755) Resp:  [17-28] 20 (09/02 0755) BP: (97-129)/(63-86) 102/75 (09/02 0752) SpO2:  [90 %-96 %] 95 % (09/02 0752) FiO2 (%):  [50 %] 50 % (09/02 0045) Weight:  [156.2 kg] 156.2 kg (09/02 0433) Physical Exam: General: No acute distress, sipping orange juice Cardiovascular: RRR, systolic murmur unchanged from previous Respiratory: On 6L [place] saturating at 92%. Lungs Sound clear to auscultation similar to my previous exams, however lung fields difficult to auscultate due to body habitus Abdomen: bowel sounds present, soft Extremities: 2+ pitting edema with chronic skin changes   Laboratory: Most recent CBC Lab Results  Component Value Date   WBC 7.1 05/31/2023   HGB 9.7 (L) 05/31/2023   HCT 32.1 (L) 05/31/2023   MCV 96.4 05/31/2023   PLT 315 05/31/2023   Most recent BMP    Latest Ref Rng & Units 06/03/2023    2:32 AM  BMP  Glucose 70 - 99 mg/dL 161   BUN 6 - 20 mg/dL 22   Creatinine 0.96 - 1.00 mg/dL 0.45   Sodium 409 - 811 mmol/L 138   Potassium 3.5 - 5.1 mmol/L 4.4   Chloride 98 - 111 mmol/L 94   CO2 22 - 32 mmol/L 34   Calcium 8.9 - 10.3 mg/dL 8.3     Other pertinent labs  Phosphorus 5.2, Magnesium 2.7   Imaging/Diagnostic Tests: Chest XR IMPRESSION: 1. Airspace disease in the mid to lower left lung field, possible atelectasis or infiltrate. 2. Small left pleural effusion. 3.  Cardiomegaly.  I independently reviewed and agree with radiologist's impression of imaging study.  Treydon Henricks, DO 06/03/2023, 11:05 AM  PGY-1, [facility] FPTS Intern pager: [phone], text pages welcome Secure chat group [facility] Teaching Service

## 2023-06-03 NOTE — Assessment & Plan Note (Addendum)
Likely multifactorial. 6L this AM and satting 92%.  - NM perfusion scan negative for PE - Patient requires 5L at home  - Has history of trach placed in July 2023, can consult CCM for respiratory support if condition changes

## 2023-06-03 NOTE — Progress Notes (Addendum)
FMTS Interim Progress Note  S: Went to bedside to see patient at bedside with Dr. Yetta Barre after nurse messaged reporting patient is complaining of cough and clear mucous. She is also asking for something for pain/restless leg. Patient reports that she has SOB of breathe and thicker mucous from her baseline and a tickle in her throat. She is on 5L of O2 at home chronically.  O: BP 97/69 (BP Location: Left Wrist)   Pulse 72   Temp 97.9 F (36.6 C) (Oral)   Resp 20   Ht 5\' 5"  (1.651 m)   Wt (!) 155.4 kg   SpO2 90%   BMI 57.01 kg/m   Respiratory: On 8L [place] saturating around 88%. Forced upper airway expiratory wheeze. Difficult to auscultate d/t body habitus and minimal positional changes Extremities: restless legs, chronic skin changes  A/P:  COPD Based on her symptoms and changes from her baseline there is suspicion for COPD exacerbation however, lungs are difficult to auscultate due to body habitus and patient had no coughing episodes in the room during our examination. Patient's history is unclear d/t reporting she has never heard of a COPD exacerbation but has had one before in 2016 and reports that she has some sputum at baseline, but it seems "thicker" at this time.  - CXR pending to further evaluate and rule out infectious process - Will consider treatment with antibiotics and steroids for COPD exacerbation if symptoms worsen  Restless Leg Syndrome - IV Toradol ordered q6h PRN for 2 doses for pain/restless leg d/t patient preference - Gabapentin 300 mg TID  Continue rest of plan per AM progress note.  Fortunato Curling, DO 06/03/2023, 12:52 AM PGY-1, [facility] Service pager [phone]

## 2023-06-03 NOTE — Progress Notes (Signed)
Patient complained of coughing and spitting up mucous, RN notified MD and had a chest xray ordered along with Toradol for her leg pain.

## 2023-06-03 NOTE — Plan of Care (Signed)
  Problem: Coping: Goal: Ability to adjust to condition or change in health will improve Outcome: Progressing   Problem: Fluid Volume: Goal: Ability to maintain a balanced intake and output will improve Outcome: Progressing   Problem: Nutritional: Goal: Progress toward achieving an optimal weight will improve Outcome: Progressing   Problem: Skin Integrity: Goal: Risk for impaired skin integrity will decrease Outcome: Progressing

## 2023-06-03 NOTE — Evaluation (Signed)
Physical Therapy Evaluation Patient Details Name: Chelsea [name] MRN: [id number] DOB: Aug 29, 1966 Today's Date: 06/03/2023  History of Present Illness  57 yo female presents to [facility] on 8/27 with SOB, admitted for aflutter and HF exacerbation. PMH includes: chronic respiratory failure with hypoxia on 5LO2 chronically and bipap at night, CHF, CAD, COPD, OSA on CPAP, afib, HLD, HTN, DM II, morbid obesity, cervical cancer, and chronic back pain.  Clinical Impression   Pt presents with generalized weakness, impaired balance, poor activity tolerance, LE pain, and inability to stand/transfer without lift. Pt to benefit from acute PT to address deficits. Pt requiring mod +2 assist for to/from EOB, can tolerate EOB sitting x10 minutes before fatiguing and Les hurting. Pt hopeful to continue therapies at d/c, has aspirations for transfer-level mobility without lift. PT to progress mobility as tolerated, and will continue to follow acutely.      SpO2 88-92% on 6LO2    If plan is discharge home, recommend the following: Two people to help with walking and/or transfers;A lot of help with bathing/dressing/bathroom   Can travel by private vehicle        Equipment Recommendations None recommended by PT  Recommendations for Other Services       Functional Status Assessment Patient has had a recent decline in their functional status and demonstrates the ability to make significant improvements in function in a reasonable and predictable amount of time.     Precautions / Restrictions Precautions Precautions: Fall Precaution Comments: left arm limitations Restrictions Weight Bearing Restrictions: No      Mobility  Bed Mobility Overal bed mobility: Needs Assistance Bed Mobility: Rolling, Supine to Sit, Sit to Supine Rolling: Mod assist   Supine to sit: HOB elevated, Used rails, Mod assist, +2 for physical assistance Sit to supine: Mod assist, +2 for physical assistance   General bed  mobility comments: roll to L and R with assist for completion of trunk and LE translation. mod +2 for supine<>sit for LE progression, trunk elevation/lowering    Transfers                   General transfer comment: unable    Ambulation/Gait                  Stairs            Wheelchair Mobility     Tilt Bed    Modified Rankin (Stroke Patients Only)       Balance Overall balance assessment: Needs assistance Sitting-balance support: Feet supported, No upper extremity supported Sitting balance-Leahy Scale: Fair Sitting balance - Comments: EOB sitting x10 minutes with LE exercises, cannot scoot or tolerate challenge       Standing balance comment: not attempted secondary to safety issues                             Pertinent Vitals/Pain Pain Assessment Pain Assessment: Faces Faces Pain Scale: Hurts little more Pain Location: LEs Pain Descriptors / Indicators: Discomfort, Grimacing, Guarding Pain Intervention(s): Limited activity within patient's tolerance, Monitored during session, Repositioned    Home Living Family/patient expects to be discharged to:: Skilled nursing facility                   Additional Comments: pt is a resident of [facility]    Prior Function Prior Level of Function : Needs assist       Physical Assist : Mobility (physical);ADLs (physical)  Mobility (physical): Bed mobility;Transfers   Mobility Comments: assisted for bed mobility, was working on seated interventions, hoyer lift to chair. Pt states she was working on standing, but this had been "some time ago", has goals to progress to pre-gait and gait in future ADLs Comments: Pt requires assistance with bathing, dressing, hygiene tasks, IADLs     Extremity/Trunk Assessment   Upper Extremity Assessment Upper Extremity Assessment: Defer to OT evaluation LUE Deficits / Details: Limitations at shoulder and hand with AROM.  Shoulder flexion  AROM in supine 0-50 degrees with AAROM approximately 0-70 degrees.  Swan neck deformity noted in 3rd digit with decreased PROM MP, PIP, or DIP flexion.  Overall decreased flexion throughout other digits as well to only approximately 50% of normal.  Elbow AROM WFLs to bring hand to nose.  Strength in elbow not tested.    Lower Extremity Assessment Lower Extremity Assessment: Generalized weakness (LE edema, pain)    Cervical / Trunk Assessment Cervical / Trunk Assessment: Other exceptions Cervical / Trunk Exceptions: body habitus  Communication   Communication Communication: No apparent difficulties  Cognition Arousal: Alert Behavior During Therapy: WFL for tasks assessed/performed Overall Cognitive Status: Within Functional Limits for tasks assessed                                          General Comments      Exercises     Assessment/Plan    PT Assessment Patient needs continued PT services  PT Problem List Decreased strength;Decreased mobility;Decreased activity tolerance;Decreased balance;Decreased knowledge of use of DME;Pain;Cardiopulmonary status limiting activity       PT Treatment Interventions DME instruction;Therapeutic activities;Therapeutic exercise;Patient/family education;Balance training;Functional mobility training;Neuromuscular re-education    PT Goals (Current goals can be found in the Care Plan section)  Acute Rehab PT Goals PT Goal Formulation: With patient Time For Goal Achievement: 06/17/23 Potential to Achieve Goals: Good    Frequency Min 1X/week     Co-evaluation               AM-PAC PT "6 Clicks" Mobility  Outcome Measure Help needed turning from your back to your side while in a flat bed without using bedrails?: A Lot Help needed moving from lying on your back to sitting on the side of a flat bed without using bedrails?: A Lot Help needed moving to and from a bed to a chair (including a wheelchair)?: Total Help needed  standing up from a chair using your arms (e.g., wheelchair or bedside chair)?: Total Help needed to walk in hospital room?: Total Help needed climbing 3-5 steps with a railing? : Total 6 Click Score: 8    End of Session   Activity Tolerance: Patient tolerated treatment well Patient left: in bed;with call bell/phone within reach;with bed alarm set Nurse Communication: Mobility status PT Visit Diagnosis: Other abnormalities of gait and mobility (R26.89);Muscle weakness (generalized) (M62.81)    Time: 0981-1914 PT Time Calculation (min) (ACUTE ONLY): 22 min   Charges:   PT Evaluation $PT Eval Low Complexity: 1 Low   PT General Charges $$ ACUTE PT VISIT: 1 Visit         Marye Round, PT DPT Acute Rehabilitation Services Secure Chat Preferred  Office [phone]   Annina Piotrowski E Christain [place] 06/03/2023, 1:58 PM

## 2023-06-03 NOTE — Assessment & Plan Note (Addendum)
Patient maintaining heart rate in the 70s. Echo performed LVEF 60-65% with mod AS - Amiodarone loading dose x1 and digoxin x1 on admission for a flutter with RVR, with several additional digoxin doses and put on amiodarone drip.  - transitioned to amiodarone 200mg  BID - Cardiology following, appreciate recs - continue home eliquis 5mg . Holding home metoprolol

## 2023-06-03 NOTE — Assessment & Plan Note (Deleted)
She does note that she has IBS and will often get diarrhea if she goes too long without eating.  -Continue to monitor this -Patient is afebrile, no abdominal pain or distention.

## 2023-06-03 NOTE — Assessment & Plan Note (Addendum)
Reported that pt has been having some anxiety at night - atarax 25mg  TID PRN - wellbutrin 100mg  bid

## 2023-06-04 DIAGNOSIS — I350 Nonrheumatic aortic (valve) stenosis: Secondary | ICD-10-CM

## 2023-06-04 DIAGNOSIS — I5033 Acute on chronic diastolic (congestive) heart failure: Secondary | ICD-10-CM | POA: Diagnosis not present

## 2023-06-04 DIAGNOSIS — I4892 Unspecified atrial flutter: Secondary | ICD-10-CM | POA: Diagnosis not present

## 2023-06-04 DIAGNOSIS — J4489 Other specified chronic obstructive pulmonary disease: Secondary | ICD-10-CM | POA: Diagnosis not present

## 2023-06-04 DIAGNOSIS — I5032 Chronic diastolic (congestive) heart failure: Secondary | ICD-10-CM | POA: Diagnosis not present

## 2023-06-04 DIAGNOSIS — I480 Paroxysmal atrial fibrillation: Secondary | ICD-10-CM

## 2023-06-04 HISTORY — DX: Other disorders of phosphorus metabolism: E83.39

## 2023-06-04 LAB — BASIC METABOLIC PANEL
Anion gap: 10 (ref 5–15)
BUN: 18 mg/dL (ref 6–20)
CO2: 38 mmol/L — ABNORMAL HIGH (ref 22–32)
Calcium: 8.5 mg/dL — ABNORMAL LOW (ref 8.9–10.3)
Chloride: 92 mmol/L — ABNORMAL LOW (ref 98–111)
Creatinine, Ser: 0.87 mg/dL (ref 0.44–1.00)
GFR, Estimated: >60 mL/min (ref >60)
Glucose, Bld: 137 mg/dL — ABNORMAL HIGH (ref 70–99)
Potassium: 4.1 mmol/L (ref 3.5–5.1)
Sodium: 140 mmol/L (ref 135–145)

## 2023-06-04 LAB — CK: Total CK: 17 U/L — ABNORMAL LOW (ref 38–234)

## 2023-06-04 LAB — GLUCOSE, CAPILLARY
Glucose-Capillary: 147 mg/dL — ABNORMAL HIGH (ref 70–99)
Glucose-Capillary: 192 mg/dL — ABNORMAL HIGH (ref 70–99)
Glucose-Capillary: 191 mg/dL — ABNORMAL HIGH (ref 70–99)
Glucose-Capillary: 184 mg/dL — ABNORMAL HIGH (ref 70–99)

## 2023-06-04 LAB — MAGNESIUM: Magnesium: 2.4 mg/dL (ref 1.7–2.4)

## 2023-06-04 LAB — PHOSPHORUS: Phosphorus: 5 mg/dL — ABNORMAL HIGH (ref 2.5–4.6)

## 2023-06-04 MED ORDER — CALCIUM ACETATE (PHOS BINDER) 667 MG PO CAPS
667.0000 mg | ORAL_CAPSULE | Freq: Three times a day (TID) | ORAL | Status: AC
  Administered 2023-06-04 – 2023-06-05 (×3): 667 mg via ORAL
  Filled 2023-06-04 (×3): qty 1

## 2023-06-04 MED ORDER — KETOROLAC TROMETHAMINE 15 MG/ML IJ SOLN
7.5000 mg | Freq: Four times a day (QID) | INTRAMUSCULAR | Status: AC | PRN
  Administered 2023-06-05 (×2): 7.5 mg via INTRAVENOUS
  Filled 2023-06-04 (×2): qty 1

## 2023-06-04 MED ORDER — FUROSEMIDE 10 MG/ML IJ SOLN
80.0000 mg | Freq: Two times a day (BID) | INTRAMUSCULAR | Status: DC
  Administered 2023-06-04 – 2023-06-07 (×6): 80 mg via INTRAVENOUS
  Filled 2023-06-04 (×6): qty 8

## 2023-06-04 NOTE — Assessment & Plan Note (Addendum)
147 this AM, 3U novolog given - resistant sliding scale - jardiance 10mg  daily  - 5U semglee  - continue CBG monitoring before meals and at bedtime for now

## 2023-06-04 NOTE — Progress Notes (Signed)
Daily Progress Note Intern Pager: [phone]  Patient name: Chelsea [name] Medical record number: [id number] Date of birth: 31-Oct-1965 Age: 57 y.o. Gender: female  Primary Care Provider: Pcp, No Consultants: cardiology Code Status: full code  Pt Overview and Major Events to Date:  8/27 - admitted 8/30 - converted to NSR 9/3 - maintaining baseline O2 requirement of 5L  Assessment and Plan:  57 year old female with past medical history of chronic respiratory failure requiring 5 L oxygen at home, COPD, HFpEF, PAF, tracheostomy (reverted in 2023) presenting initially for tachycardia, increased shortness of breath, intermittent chest pain. Patient has had multiple admissions for similar symptoms.    Cardiology is following patient and providing recommendations. Cards started pt back on IV Lasix 80mg  BID for diuresis. Patient back on home O2 requirement of 5 L.  Assessment & Plan Atrial flutter with rapid ventricular response (HCC) Patient maintaining heart rate in the 70s. Echo performed LVEF 60-65% with mod AS - Amiodarone loading dose x1 and digoxin x1 on admission for a flutter with RVR, with several additional digoxin doses and put on amiodarone drip.  - transitioned to amiodarone 200mg  BID - Cardiology following, appreciate recs - continue home eliquis 5mg . Holding home metoprolol -PT/OT Acute on chronic diastolic CHF (congestive heart failure), NYHA class 3 (HCC) Suspect this to be main contributing factor to patient's shortness of breath, probably exacerbated by tachycardia - BNP 721 on admission, chest x-ray suspicious for pulmonary edema - Cardiology following, appreciate recs - Strict I's and O's.  Net output poorly documented.  - continue spironolactone 25mg , d/c torsemide per cards - Lasix 80 mg IV TID initially, cards restarted diuresis today of 80mg  IV BID - Goal K >4, Mg >2 - Phosphorus 5.2 - phoslo given, will recheck in am - BMP, mag in AM - check CK -  assessing for rhabdomyolysis due to amiodarone  Cough productive of clear sputum Began having cough with clear sputum 9/1 -Chest x-ray with possible atelectasis or infiltrate in L lower lung field, appears unchanged from previous. Afebrile. Lung sounds appear clear on exam.  -Pulmonary toiletry, flutter valve, incentive spirometry started yesterday with improvement of cough -Respiratory viral panel ordered and unremarkable Acute on chronic respiratory failure with hypoxia (HCC) Likely multifactorial. 5L this morning (pt's baseline) satting 90%.  - NM perfusion scan negative for PE - Patient requires 5L at home  - Has history of trach placed in July 2023, can consult CCM for respiratory support if condition changes Hyperphosphatemia Phosphorus 4.7 > 5.2 > 5.0 after phoslo x1. Cr normal at 0.87. Not on any vitamin D analog medications inpatient, but looks like she takes 5000U at her facility, maybe this could be contributing. Looks like her phosphate has been around 5 for the last year. - phoslo x2 today - CK 17, no concern for rhabdomyolysis 2/2 amiodarone - renal diet Restless leg syndrome - gabapentin 300mg  TID  - Restarted home ferrous sulfate 325mg  daily - Requip 0.25mg  daily at bedtime - Percocet 5-325mg  Q6h PRN for now Chronic iron deficiency anemia Iron panel with iron of 13, decreased TIBC 392, decreased ferritin 29. Pt takes ferrous sulfate 325mg  every day at facility. Hemoglobin stable at 9.7 - home ferrous sulfate 325mg  daily - fereheme IV x1 DM2 (diabetes mellitus, type 2) (HCC) 147 this AM, 3U novolog given - resistant sliding scale - jardiance 10mg  daily  - 5U semglee  - continue CBG monitoring before meals and at bedtime for now  Chronic and Stable Problems:  Hypothyroidism - levothyroxine daily HTN - torsemide 40mg  bid, spironolactone 25mg  daily at home  HLD - atorvastatin 20mg  every day OSA - Bipap  COPD - pulmicort nebulizer BID, goal O2 88-92% Anxiety -  wellbutrin 100mg  bid, atarax 25mg  TID PRN   FEN/GI: Heart healthy PPx: eliquis Dispo: LTC  pending cardiology recs and improving respiratory status  Subjective:  Resting comfortably, states her cough is better, no increased shortness of breath  Objective: Temp:  [98.1 F (36.7 C)-99 F (37.2 C)] 98.1 F (36.7 C) (09/03 1026) Pulse Rate:  [70-79] 77 (09/03 1026) Resp:  [19-22] 19 (09/03 1026) BP: (80-120)/(57-75) 98/61 (09/03 1026) SpO2:  [88 %-96 %] 93 % (09/03 1026) FiO2 (%):  [60 %] 60 % (09/03 0300) Weight:  [153.9 kg] 153.9 kg (09/03 0400) Physical Exam: General: Resting comfortably, just came off nightly BiPAP Cardiovascular: RRR, systolic murmur unchanged from my previous exam Respiratory: On 5 L nasal cannula saturating at 90%.  Lung sounds appear clear bilaterally Abdomen: Soft Extremities: 2+ pitting edema with chronic skin changes   Laboratory: Most recent CBC Lab Results  Component Value Date   WBC 7.1 05/31/2023   HGB 9.7 (L) 05/31/2023   HCT 32.1 (L) 05/31/2023   MCV 96.4 05/31/2023   PLT 315 05/31/2023   Most recent BMP    Latest Ref Rng & Units 06/04/2023    3:30 AM  BMP  Glucose 70 - 99 mg/dL 161   BUN 6 - 20 mg/dL 18   Creatinine 0.96 - 1.00 mg/dL 0.45   Sodium 409 - 811 mmol/L 140   Potassium 3.5 - 5.1 mmol/L 4.1   Chloride 98 - 111 mmol/L 92   CO2 22 - 32 mmol/L 38   Calcium 8.9 - 10.3 mg/dL 8.5    Other pertinent labs  Phosphorus 5, down from 5.2 yesterday after phoslo CK 17  Imaging/Diagnostic Tests: None  Camil Hausmann, DO 06/04/2023, 10:33 AM  PGY-1, [facility] FPTS Intern pager: [phone], text pages welcome Secure chat group [facility] Teaching Service

## 2023-06-04 NOTE — Assessment & Plan Note (Addendum)
Phosphorus 4.7 > 5.2 > 5.0 after phoslo x1. Cr normal at 0.87. Not on any vitamin D analog medications inpatient, but looks like she takes 5000U at her facility, maybe this could be contributing. Looks like her phosphate has been around 5 for the last year. - phoslo x2 today - CK 17, no concern for rhabdomyolysis 2/2 amiodarone - renal diet

## 2023-06-04 NOTE — Plan of Care (Signed)

## 2023-06-04 NOTE — Discharge Instructions (Addendum)
Dear Thomes Lolling,   Thank you so much for allowing [place] to be part of your care!  You were admitted to [facility] for a fast heart rate and increased fluid on your legs.    You were treated with medications to help your heart rate and rhythm. You were also treated with medications to take fluid off of your legs.  Continue on amiodarone 200 mg twice a day for total of 2 weeks then transition to 200 mg a day after (06/21/23).   Additionally, please continue with your home Spironolactone and Torsemide    POST-HOSPITAL & CARE INSTRUCTIONS Follow up with your cardiologist, you will need to make that appt  Follow up with your PCP as well  Please let PCP/Specialists know of any changes that were made.  Please see medications section of this packet for any medication changes.   DOCTOR'S APPOINTMENT & FOLLOW UP CARE INSTRUCTIONS  No future appointments.  RETURN PRECAUTIONS: -Worsening shortness of breath -Heart palpitations  -Leg swelling  -Confusion   Take care and be well!  Family Medicine Teaching Service  [place]  [facility]  [address] [place], [place] [address] [phone]

## 2023-06-04 NOTE — Assessment & Plan Note (Deleted)
Reported that pt has been having some anxiety at night - atarax 25mg  TID PRN - wellbutrin 100mg  bid

## 2023-06-04 NOTE — Assessment & Plan Note (Signed)
-   gabapentin 300mg  TID  - Restarted home ferrous sulfate 325mg  daily - Requip 0.25mg  daily at bedtime - Percocet 5-325mg  Q6h PRN for now

## 2023-06-04 NOTE — TOC Progression Note (Signed)
Transition of Care [facility]) - Progression Note    Patient Details  Name: Chelsea [name] MRN: [id number] Date of Birth: 02-05-1966  Transition of Care [name]) CM/SW Contact  Inis Sizer, LCSW Phone Number: 06/04/2023, 8:39 AM  Clinical Narrative:    CSW spoke with Kia at [facility] who requested a new FL2 be completed.  CSW completed FL2 and sent it to [facility] for review.  Patient is from LTC at [facility] and can return once medically stable.        Expected Discharge Plan and Services                                               Social Determinants of Health (SDOH) Interventions SDOH Screenings   Food Insecurity: No Food Insecurity (12/29/2022)  Housing: Low Risk  (12/29/2022)  Transportation Needs: No Transportation Needs (12/29/2022)  Utilities: Not At Risk (12/29/2022)  Alcohol Screen: Low Risk  (07/05/2022)  Financial Resource Strain: Low Risk  (07/05/2022)  Physical Activity: Insufficiently Active (03/27/2021)   Received from [facility], [facility]  Social Connections: Unknown (02/04/2022)   Received from [facility], [facility]  Stress: No Stress Concern Present (09/15/2021)   Received from [facility], [facility]  Tobacco Use: Low Risk  (12/29/2022)    Readmission Risk Interventions    07/23/2022    2:19 PM 04/09/2022    4:17 PM  Readmission Risk Prevention Plan  Transportation Screening Complete Complete  PCP or Specialist Appt within 3-5 Days  Complete  HRI or Home Care Consult  Complete  Social Work Consult for Recovery Care Planning/Counseling  Complete  Palliative Care Screening  Not Applicable  Medication Review [employer]) Complete Referral to Pharmacy  PCP or Specialist appointment within 3-5 days of discharge Complete   HRI or Home Care Consult Complete   SW Recovery Care/Counseling Consult Complete   Palliative Care Screening Not Applicable   Skilled Nursing Facility Complete

## 2023-06-04 NOTE — NC FL2 (Signed)
Chelsea [name] MEDICAID FL2 LEVEL OF CARE FORM     IDENTIFICATION  Patient Name: Chelsea [name] Birthdate: 09-24-1966 Sex: female Admission Date (Current Location): 05/28/2023  [place] and [place] Number:  Chelsea [name] [id number] P Facility and Address:  [facility], [address], [place], [place] [address]      Provider Number: [id number]  Attending Physician Name and Address:  Chelsea Eland, MD  Relative Name and Phone Number:       Current Level of Care: Hospital Recommended Level of Care: Skilled Nursing Facility Prior Approval Number:    Date Approved/Denied:   PASRR Number: [id number]  Discharge Plan: SNF    Current Diagnoses: Patient Active Problem List   Diagnosis Date Noted   Cough productive of clear sputum 06/03/2023   Anxiety 05/30/2023   Atrial flutter with rapid ventricular response (HCC) 05/29/2023   Respiratory failure with hypoxia and hypercapnia (HCC) 12/29/2022   Arthritis 11/13/2022   Asthma 11/13/2022   Chronic lower back pain 11/13/2022   Complication of anesthesia 11/13/2022   Coronary artery disease 11/13/2022   Diastolic congestive heart failure (HCC) 11/13/2022   Heart murmur 11/13/2022   History of gout 11/13/2022   Hypertension 11/13/2022   Migraine 11/13/2022   On home oxygen therapy 11/13/2022   OSA treated with BiPAP 11/13/2022   Pneumonia 11/13/2022   Chronic iron deficiency anemia 09/18/2022   Acute on chronic respiratory failure with hypoxia (HCC) 09/17/2022   Chronic disease anemia 08/01/2022   CHF exacerbation (HCC) 07/30/2022   CHF (congestive heart failure) (HCC) 07/30/2022   Upper GI bleed 07/18/2022   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   Acute blood loss anemia 07/18/2022   Acute on chronic hypoxic respiratory failure (HCC) 07/04/2022   Bleeding of the respiratory tract 04/07/2022   Acute on chronic respiratory failure with hypoxia and hypercapnia  (HCC) 04/07/2022   Pulmonary edema, acute (HCC) 02/21/2022   Restrictive lung disease secondary to obesity 01/28/2022   Limitation of activity due to disability 09/18/2021   Paroxysmal atrial fibrillation (HCC) 09/04/2021   DJD (degenerative joint disease) 07/03/2021   Pressure injury of both heels, unstageable (HCC) 07/03/2021   Venous insufficiency of both lower extremities 07/02/2021   Physical deconditioning 04/24/2021   Depression 04/20/2021   History of tobacco use 04/20/2021   Hyperglycemia due to type 2 diabetes mellitus (HCC) 04/20/2021   Irritable bowel syndrome with constipation 04/20/2021   Microalbuminuria 04/20/2021   On supplemental oxygen by nasal cannula 04/20/2021   Renal insufficiency 04/20/2021   Heart failure (HCC) 04/20/2021   Moderate aortic stenosis 04/20/2021   Mild tricuspid regurgitation 04/02/2021   Mitral valve sclerosis 04/02/2021   Morbid obesity with BMI of 50.0-59.9, adult (HCC) 04/02/2021   Non-smoker 04/02/2021   Awaiting admission to adequate facility elsewhere 03/28/2021   Metabolic alkalosis 03/28/2021   Restless leg syndrome 03/22/2021   Weakness 03/20/2021   Acute congestive heart failure (HCC) 03/18/2021   Hx of atrial flutter 03/15/2021   Weight loss 03/15/2021   Atrial flutter (HCC) 03/09/2021   S/P hysterectomy 02/19/2021   Pulmonary nodule less than 6 cm determined by computed tomography of lung 02/09/2021   Dyspnea on exertion 02/09/2021   Nocturnal hypoxemia 06/03/2020   Mixed hyperlipidemia 04/18/2020   Hair loss 09/17/2019   Chronic pain of multiple joints 06/18/2019   Onychomycosis 06/18/2019   Acute idiopathic gout of left hand 02/16/2019   Tinea pedis of both feet 11/12/2018   Hypertension associated with  diabetes (HCC) 08/25/2018   Class 3 severe obesity with serious comorbidity and body mass index (BMI) of 50.0 to 59.9 in adult [facility]) 08/25/2018   Class 3 severe obesity due to excess calories with serious comorbidity and  body mass index (BMI) of 60.0 to 69.9 in adult [facility]) 08/25/2018   Chronic respiratory failure with hypoxia (HCC) 07/02/2018   Physical debility 05/10/2018   Hepatitis C    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   Chronic heart failure with preserved ejection fraction (HCC) 02/23/2018   Dyspnea 12/05/2017   Acute on chronic diastolic CHF (congestive heart failure) (HCC) 09/25/2017   Acute on chronic diastolic CHF (congestive heart failure), NYHA class 3 (HCC) 09/25/2017   Pneumonia due to gram-positive bacteria 08/24/2017   Acute respiratory failure with hypoxia and hypercarbia (HCC) 05/27/2016   Hyperlipidemia 09/28/2015   COPD with acute exacerbation (HCC) 03/04/2015   Bronchitis 05/30/2011   Neuropathic pain, leg 04/09/2011   Mononeuropathy of lower extremity 04/09/2011   Edema 03/22/2011   Morbid obesity (HCC) 03/22/2011   History of cervical cancer 03/22/2011   Lymphedema of both lower extremities 03/22/2011   Morbid (severe) obesity due to excess calories (HCC) 03/22/2011   Obesity hypoventilation syndrome (HCC) 01/01/2011   Osteoarthritis 11/28/2010   Chronic diastolic heart failure (HCC) 11/14/2010   Acquired hypothyroidism 11/13/2010   UNSPEC COMBINED SYSTOLIC&DIASTOLIC HEART FAILURE 11/13/2010   HYPERTHYROIDISM 11/10/2010   Obstructive sleep apnea 11/10/2010   Essential hypertension 11/10/2010   GERD 11/10/2010   Gastro-esophageal reflux disease without esophagitis 11/10/2010   Essential (primary) hypertension 11/10/2010   Obstructive sleep apnea (adult) (pediatric) 11/10/2010   DM2 (diabetes mellitus, type 2) (HCC) 11/10/2010   Cervical cancer (HCC) 2006    Orientation RESPIRATION BLADDER Height & Weight     Self, Time, Situation, Place  O2 (5L) External catheter, Continent Weight: (!) 339 lb 4.6 oz (153.9 kg) Height:  5\' 5"  (165.1 cm)  BEHAVIORAL SYMPTOMS/MOOD NEUROLOGICAL BOWEL NUTRITION STATUS      Continent Diet (Heart healthy)   AMBULATORY STATUS COMMUNICATION OF NEEDS Skin   Extensive Assist Verbally Normal                       Personal Care Assistance Level of Assistance  Bathing, Feeding, Dressing Bathing Assistance: Maximum assistance Feeding assistance: Independent Dressing Assistance: Maximum assistance     Functional Limitations Info  Sight, Hearing, Speech Sight Info: Adequate Hearing Info: Adequate Speech Info: Adequate    SPECIAL CARE FACTORS FREQUENCY  PT (By licensed PT), OT (By licensed OT)     PT Frequency: 3x weekly OT Frequency: 3x weekly            Contractures Contractures Info: Not present    Additional Factors Info  Code Status, Allergies, Psychotropic, Insulin Sliding Scale Code Status Info: Full Code Allergies Info: Iodinated Contrast Media  Penicillins  Insulin Lispro  Minoxidil Psychotropic Info: Wellbutrin, Atarax PRN Insulin Sliding Scale Info: See discharge summary       Current Medications (06/04/2023):  This is the current hospital active medication list Current Facility-Administered Medications  Medication Dose Route Frequency Provider Last Rate Last Admin   0.9 %  sodium chloride infusion   Intravenous Continuous Pricilla Riffle, MD 20 mL/hr at 05/30/23 2238 New Bag at 05/30/23 2238   amiodarone (PACERONE) tablet 200 mg  200 mg Oral BID Alfredo Martinez, MD   200 mg at 06/03/23 2200   apixaban (ELIQUIS) tablet 5 mg  5 mg Oral BID Peterson Ao, MD   5 mg at 06/03/23 2200   atorvastatin (LIPITOR) tablet 20 mg  20 mg Oral QPM Hindel, Leah, MD   20 mg at 06/03/23 1723   budesonide (PULMICORT) nebulizer solution 0.5 mg  0.5 mg Inhalation BID Westley Chandler, MD   0.5 mg at 06/03/23 2111   buPROPion ER (WELLBUTRIN SR) 12 hr tablet 100 mg  100 mg Oral BID Everhart, Kirstie, DO   100 mg at 06/03/23 2200   calcium acetate (PHOSLO) capsule 667 mg  667 mg Oral TID WC Maxwell, Allee, MD       empagliflozin (JARDIANCE) tablet 10 mg  10 mg Oral Daily Everhart, Kirstie,  DO   10 mg at 06/03/23 1104   ferrous sulfate tablet 325 mg  325 mg Oral Daily Everhart, Kirstie, DO   325 mg at 06/03/23 1106   gabapentin (NEURONTIN) capsule 300 mg  300 mg Oral TID Everhart, Kirstie, DO   300 mg at 06/03/23 2200   hydrOXYzine (ATARAX) tablet 25 mg  25 mg Oral TID PRN Alicia Amel, MD   25 mg at 05/31/23 2051   insulin aspart (novoLOG) injection 0-20 Units  0-20 Units Subcutaneous TID WC Hindel, Leah, MD   3 Units at 06/04/23 0755   insulin glargine-yfgn [facility]) injection 5 Units  5 Units Subcutaneous Daily Everhart, Kirstie, DO   5 Units at 06/03/23 1104   ketorolac (TORADOL) 15 MG/ML injection 7.5 mg  7.5 mg Intravenous Q6H PRN Erick Alley, DO   7.5 mg at 06/03/23 2345   levothyroxine (SYNTHROID) tablet 150 mcg  150 mcg Oral Q0600 Everhart, Kirstie, DO   150 mcg at 06/04/23 0750   oxyCODONE-acetaminophen (PERCOCET/ROXICET) 5-325 MG per tablet 1 tablet  1 tablet Oral Q6H PRN Hindel, Leah, MD   1 tablet at 06/03/23 1457   rOPINIRole (REQUIP) tablet 0.25 mg  0.25 mg Oral QHS Maxwell, Allee, MD   0.25 mg at 06/03/23 2200   spironolactone (ALDACTONE) tablet 25 mg  25 mg Oral Daily Jake Bathe, MD   25 mg at 06/03/23 1104   torsemide (DEMADEX) tablet 40 mg  40 mg Oral BID Jake Bathe, MD   40 mg at 06/03/23 1722     Discharge Medications: Please see discharge summary for a list of discharge medications.  Relevant Imaging Results:  Relevant Lab Results:   Additional Information SSN: [id number]  Inis Sizer, LCSW

## 2023-06-04 NOTE — Assessment & Plan Note (Addendum)
Patient maintaining heart rate in the 70s. Echo performed LVEF 60-65% with mod AS - Amiodarone loading dose x1 and digoxin x1 on admission for a flutter with RVR, with several additional digoxin doses and put on amiodarone drip.  - transitioned to amiodarone 200mg  BID - Cardiology following, appreciate recs - continue home eliquis 5mg . Holding home metoprolol -PT/OT

## 2023-06-04 NOTE — Progress Notes (Addendum)
   Patient Name: Chelsea [name] Date of Encounter: 06/04/2023 [facility] Cardiologist: Donato Schultz, MD   Interval Summary  .    Worried bout her O2 sat; on 5L Vital Signs .    Vitals:   06/04/23 0006 06/04/23 0400 06/04/23 0755 06/04/23 0842  BP: 98/70 90/60 111/75   Pulse: 79 70 78 72  Resp: 20 19 20  (!) 22  Temp: 98.5 F (36.9 C) 98.7 F (37.1 C) 98.1 F (36.7 C)   TempSrc: Oral Oral Oral   SpO2: 90% 96% 90% 94%  Weight:  (!) 153.9 kg    Height:        Intake/Output Summary (Last 24 hours) at 06/04/2023 0910 Last data filed at 06/04/2023 0026 Gross per 24 hour  Intake 951 ml  Output 4200 ml  Net -3249 ml      06/04/2023    4:00 AM 06/03/2023    4:33 AM 06/02/2023    4:27 AM  Last 3 Weights  Weight (lbs) 339 lb 4.6 oz 344 lb 5.7 oz 342 lb 9.5 oz  Weight (kg) 153.9 kg 156.2 kg 155.4 kg      Telemetry/ECG    Sinus rhythm- Personally Reviewed  Prior EKG showed A-fib  Echo EF 65% normal RV left atrium mildly to moderately dilated, moderate aortic stenosis no change from prior  Physical Exam .   GEN: No acute distress.  Obese. Lying at an incline in bed Neck: cannot determine JVD with neck girth Cardiac: RRR, 2/6 murmur, rubs, or gallops.  Respiratory: Clear to auscultation bilaterally. GI: Soft, nontender, non-distended  MS: BL edema  Assessment & Plan .     Paroxysmal atrial fibrillation/flutter - On Eliquis for chronic anticoagulation - On amiodarone 200 mg twice a day for total of 2 weeks then transitioning to 200 mg a day thereafter -Had trouble with traditional medications secondary to hypotension. -Would not be a good candidate for ablation given her current body habitus.  Chronic diastolic heart failure secondary to morbid obesity obesity hypoventilation syndrome - Was switched to her maintenance diuresis as per home medications (40 mg BID of torsemide); however her legs are still pretty tight; will restart IV diuretics; can manage  diarrhea. K>4, Mg>2. Can continue as long as her kidneys can tolerate. Spironolactone 25 mg a day, torsemide 40 mg twice a day  Aortic stenosis - Moderate on echocardiogram -In part contributing to diastolic heart failure but not severe and not requiring replacement.  It is possible that given her current medical status, obesity that she would not be a candidate for valve replacement in the future.  Diarrhea - Lasix IV was held previously secondary to diarrhea; will restart, can give antidiarrheal medication   For questions or updates, please contact  [facility] Please consult [email or web address] for contact info under        Signed, Kimmi Acocella, Alben Spittle, MD

## 2023-06-04 NOTE — Assessment & Plan Note (Addendum)
Began having cough with clear sputum 9/1 -Chest x-ray with possible atelectasis or infiltrate in L lower lung field, appears unchanged from previous. Afebrile. Lung sounds appear clear on exam.  -Pulmonary toiletry, flutter valve, incentive spirometry started yesterday with improvement of cough -Respiratory viral panel ordered and unremarkable

## 2023-06-04 NOTE — Assessment & Plan Note (Addendum)
Likely multifactorial. 5L this morning (pt's baseline) satting 90%.  - NM perfusion scan negative for PE - Patient requires 5L at home  - Has history of trach placed in July 2023, can consult CCM for respiratory support if condition changes

## 2023-06-04 NOTE — Assessment & Plan Note (Deleted)
-   pulmicort nebulizer BID - now on baseline 5L - O2 goal 88-92%

## 2023-06-04 NOTE — Assessment & Plan Note (Signed)
Iron panel with iron of 13, decreased TIBC 392, decreased ferritin 29. Pt takes ferrous sulfate 325mg  every day at facility. Hemoglobin stable at 9.7 - home ferrous sulfate 325mg  daily - fereheme IV x1

## 2023-06-04 NOTE — Assessment & Plan Note (Addendum)
Suspect this to be main contributing factor to patient's shortness of breath, probably exacerbated by tachycardia - BNP 721 on admission, chest x-ray suspicious for pulmonary edema - Cardiology following, appreciate recs - Strict I's and O's.  Net output poorly documented.  - continue spironolactone 25mg , d/c torsemide per cards - Lasix 80 mg IV TID initially, cards restarted diuresis today of 80mg  IV BID - Goal K >4, Mg >2 - Phosphorus 5.2 - phoslo given, will recheck in am - BMP, mag in AM - check CK - assessing for rhabdomyolysis due to amiodarone

## 2023-06-05 DIAGNOSIS — I5033 Acute on chronic diastolic (congestive) heart failure: Secondary | ICD-10-CM | POA: Diagnosis not present

## 2023-06-05 LAB — BASIC METABOLIC PANEL
Anion gap: 12 (ref 5–15)
BUN: 18 mg/dL (ref 6–20)
CO2: 37 mmol/L — ABNORMAL HIGH (ref 22–32)
Calcium: 8.5 mg/dL — ABNORMAL LOW (ref 8.9–10.3)
Chloride: 90 mmol/L — ABNORMAL LOW (ref 98–111)
Creatinine, Ser: 0.94 mg/dL (ref 0.44–1.00)
GFR, Estimated: >60 mL/min (ref >60)
Glucose, Bld: 143 mg/dL — ABNORMAL HIGH (ref 70–99)
Potassium: 4 mmol/L (ref 3.5–5.1)
Sodium: 139 mmol/L (ref 135–145)

## 2023-06-05 LAB — GLUCOSE, CAPILLARY
Glucose-Capillary: 133 mg/dL — ABNORMAL HIGH (ref 70–99)
Glucose-Capillary: 161 mg/dL — ABNORMAL HIGH (ref 70–99)
Glucose-Capillary: 197 mg/dL — ABNORMAL HIGH (ref 70–99)
Glucose-Capillary: 269 mg/dL — ABNORMAL HIGH (ref 70–99)

## 2023-06-05 LAB — MAGNESIUM: Magnesium: 2.3 mg/dL (ref 1.7–2.4)

## 2023-06-05 LAB — PHOSPHORUS: Phosphorus: 4.9 mg/dL — ABNORMAL HIGH (ref 2.5–4.6)

## 2023-06-05 MED ORDER — CALCIUM ACETATE (PHOS BINDER) 667 MG PO CAPS
667.0000 mg | ORAL_CAPSULE | Freq: Three times a day (TID) | ORAL | Status: AC
  Administered 2023-06-05 – 2023-06-06 (×3): 667 mg via ORAL
  Filled 2023-06-05 (×3): qty 1

## 2023-06-05 NOTE — Assessment & Plan Note (Addendum)
Suspect this to be main contributing factor to patient's shortness of breath, probably exacerbated by tachycardia - BNP 721 on admission, chest x-ray suspicious for pulmonary edema - Cardiology following, appreciate recs - Strict I's and O's.  Good output, -1776 yesterday - continue spironolactone 25mg , d/c torsemide per cards - K 4.0, Mag 2.3 - Lasix 80 mg IV BID - Goal K >4, Mg >2 - BMP, mag in AM

## 2023-06-05 NOTE — Assessment & Plan Note (Signed)
Iron panel with iron of 13, decreased TIBC 392, decreased ferritin 29. Pt takes ferrous sulfate 325mg  every day at facility. Hemoglobin stable at 9.7 - home ferrous sulfate 325mg  daily - fereheme IV x1

## 2023-06-05 NOTE — Progress Notes (Signed)
   06/05/23 0237  BiPAP/CPAP/SIPAP  BiPAP/CPAP/SIPAP Pt Type Adult  BiPAP/CPAP/SIPAP SERVO  Mask Size Large  Set Rate 10 breaths/min  Respiratory Rate 21 breaths/min  IPAP 16 cmH20  EPAP 8 cmH2O  FiO2 (%) 60 %  Minute Ventilation 8.1  Leak 0  Peak Inspiratory Pressure (PIP) 15  Tidal Volume (Vt) 536  Patient Home Equipment No  Press High Alarm 30 cmH2O

## 2023-06-05 NOTE — Assessment & Plan Note (Addendum)
Began having cough with clear sputum 9/1, this has improved -Chest x-ray with possible atelectasis or infiltrate in L lower lung field, appears unchanged from previous. Afebrile. Lung sounds appear clear on exam.  -Pulmonary toiletry, flutter valve, incentive spirometry -Respiratory viral panel ordered and unremarkable

## 2023-06-05 NOTE — Assessment & Plan Note (Addendum)
Phosphorus 5.2 > 5.0 > 4.7. Cr normal at 0.87. Not on any vitamin D analog medications inpatient, but looks like she takes 5000U at her facility, maybe this could be contributing. Looks like her phosphate has been around 5 for the last year. - phoslo x1 today - CK 17, no concern for rhabdomyolysis 2/2 amiodarone - renal diet

## 2023-06-05 NOTE — Assessment & Plan Note (Signed)
Likely multifactorial. 5L this morning (pt's baseline) satting 90%.  - NM perfusion scan negative for PE - Patient requires 5L at home  - Has history of trach placed in July 2023, can consult CCM for respiratory support if condition changes

## 2023-06-05 NOTE — Assessment & Plan Note (Signed)
133 this AM, 3U novolog given - resistant sliding scale - jardiance 10mg  daily  - 5U semglee  - continue CBG monitoring before meals and at bedtime for now

## 2023-06-05 NOTE — Progress Notes (Addendum)
Patient Name: Chelsea [name] Date of Encounter: 06/05/2023 [facility] Cardiologist: Donato Schultz, MD   Interval Summary  .    Breathing and swelling has improved.  On baseline home oxygen of 5 L.  Nasal cannula  Vital Signs .    Vitals:   06/05/23 0549 06/05/23 0551 06/05/23 0735 06/05/23 0904  BP:  98/73 110/81   Pulse:   74 68  Resp:  18 16 20   Temp:  97.6 F (36.4 C) 98.4 F (36.9 C)   TempSrc:  Oral Oral   SpO2:  100% 91%   Weight: (!) 152.2 kg     Height:        Intake/Output Summary (Last 24 hours) at 06/05/2023 1046 Last data filed at 06/05/2023 0554 Gross per 24 hour  Intake 474 ml  Output 2250 ml  Net -1776 ml      06/05/2023    5:49 AM 06/04/2023    4:00 AM 06/03/2023    4:33 AM  Last 3 Weights  Weight (lbs) 335 lb 8.6 oz 339 lb 4.6 oz 344 lb 5.7 oz  Weight (kg) 152.2 kg 153.9 kg 156.2 kg      Telemetry/ECG    Normal sinus rhythm heart rates in the 70s- Personally Reviewed  CV Studies    Echocardiogram 06/01/2023  1. Left ventricular ejection fraction, by estimation, is 60 to 65%. The  left ventricle has normal function. The left ventricle has no regional  wall motion abnormalities. Left ventricular diastolic parameters were  normal.   2. Right ventricular systolic function is normal. The right ventricular  size is normal.   3. Left atrial size was mild to moderately dilated.   4. The mitral valve is degenerative. No evidence of mitral valve  regurgitation.   5. The aortic valve is calcified. Aortic valve regurgitation is trivial.  Moderate aortic valve stenosis.   6. The inferior vena cava is dilated in size with >50% respiratory  variability, suggesting right atrial pressure of 8 mmHg.   Comparison(s): No significant change from prior study.   Physical Exam .   GEN: No acute distress.   Neck: Difficult to assess JVD Cardiac: RRR. + 3 out of 6 murmur appreciated Respiratory: Only able to auscultate anterior side due to immobility.   Overall sounds clear though. GI: Soft, nontender, non-distended  MS: 1-2+ pitting edema Patient Profile    Chelsea [name] is a 57 y.o. female has hx of chronic respiratory failure requiring 5 L at home, COPD, HFpEF, paroxysmal atrial fibrillation, tracheostomy, morbid obesity and admitted on 05/28/2023 for the evaluation of atrial fibrillation/flutter  Assessment & Plan .     Paroxysmal atrial flutter/fibrillation with RVR Initially presented with elevated rates and got a loading dose of amiodarone and digoxin.  She converted back to normal sinus rhythm on amio 05/31/2023 and maintaining.  She has been transition to amiodarone 200 mg twice daily. Continue amiodarone 200 mg twice daily, Eliquis 5 mg.  Holding beta-blocker.  Will discuss with MD long-term plans for amiodarone.  Not ideal given young age, but did not tolerate traditional medications due to hypotension. Plan for amiodarone 200 mg twice daily for 2 weeks then transition to 200 mg thereafter.  She started this regiment on 06/01/2023.  Normal liver function.  Normal TSH  Acute on chronic HFpEF Echocardiogram shows normal EF 60 to 65% with normal RV function.  LA was moderately dilated.  Still appears to have significant volume on her, but chronically has  bilateral lower extremity edema.  Volume status very difficult hard to assess due to body habitus.  However breathing has improved and she diuresed about 2.2 L yesterday.  Blood pressure has been soft however seems to be tolerating medications well. Continue Jardiance 10 mg, IV Lasix 80 mg twice daily, spironolactone 25 mg.  PTA Toprol-XL 25 mg, torsemide 40 mg twice daily.  Continue IV diuretics until renal function or symptoms indicate.  Moderate aortic stenosis No current indications for replacement at this time.  Mean gradient 18.4.  Peak gradient 31.  VTI 1.59.  For questions or updates, please contact [facility] Please consult [email or web address] for contact info under         Signed, Abagail Kitchens, PA-C    Personally seen and examined. Agree with above.  57 year old with morbid obesity paroxysmal atrial fibrillation acute on chronic diastolic heart failure - Be careful with the Jardiance 10 mg.  With her morbid obesity, she is prone for perineal infection. -IV diuresis continuing. -Continue with amiodarone 200 mg twice a day for now.  Will transition to 20 mg once a day at home.  Hypotension has been an issue and therefore we will need to utilize amiodarone at this time.  Given her morbid obesity she is not a good candidate for ablation. -Monitor closely with lab work as outpatient  Donato Schultz, MD

## 2023-06-05 NOTE — Plan of Care (Signed)

## 2023-06-05 NOTE — Progress Notes (Signed)
Daily Progress Note Intern Pager: [phone]  Patient name: Chelsea [name] Medical record number: [id number] Date of birth: 1966/06/12 Age: 57 y.o. Gender: female  Primary Care Provider: Pcp, No Consultants: cardiology Code Status: full code  Pt Overview and Major Events to Date:  8/27 - admitted 8/30 - converted to NSR 9/3 - maintaining baseline O2 requirement of 5L  Assessment and Plan:  57 year old female with past medical history of chronic respiratory failure requiring 5 L oxygen at home, COPD, HFpEF, PAF, tracheostomy (reverted in 2023) presenting initially for tachycardia, increased shortness of breath, intermittent chest pain. Patient has had multiple admissions for similar symptoms.    Cardiology is following patient and providing recommendations. Cards started pt back on IV Lasix 80mg  BID for diuresis. Patient back on home O2 requirement of 5 L since yesterday.  Assessment & Plan Acute on chronic diastolic CHF (congestive heart failure), NYHA class 3 (HCC) Suspect this to be main contributing factor to patient's shortness of breath, probably exacerbated by tachycardia - BNP 721 on admission, chest x-ray suspicious for pulmonary edema - Cardiology following, appreciate recs - Strict I's and O's.  Good output, -1776 yesterday - continue spironolactone 25mg , d/c torsemide per cards - K 4.0, Mag 2.3 - Lasix 80 mg IV BID - Goal K >4, Mg >2 - BMP, mag in AM Atrial flutter with rapid ventricular response (HCC) Patient maintaining heart rate in the 70s. Echo performed LVEF 60-65% with mod AS - Amiodarone loading dose x1 and digoxin x1 on admission for a flutter with RVR, with several additional digoxin doses and put on amiodarone drip. - transitioned to amiodarone 200mg  BID - Cardiology following, appreciate recs - continue home eliquis 5mg . Holding home metoprolol - PT/OT Cough productive of clear sputum Began having cough with clear sputum 9/1, this has  improved -Chest x-ray with possible atelectasis or infiltrate in L lower lung field, appears unchanged from previous. Afebrile. Lung sounds appear clear on exam.  -Pulmonary toiletry, flutter valve, incentive spirometry -Respiratory viral panel ordered and unremarkable Acute on chronic respiratory failure with hypoxia (HCC) Likely multifactorial. 5L this morning (pt's baseline) satting 90%.  - NM perfusion scan negative for PE - Patient requires 5L at home  - Has history of trach placed in July 2023, can consult CCM for respiratory support if condition changes Hyperphosphatemia Phosphorus 5.2 > 5.0 > 4.7. Cr normal at 0.87. Not on any vitamin D analog medications inpatient, but looks like she takes 5000U at her facility, maybe this could be contributing. Looks like her phosphate has been around 5 for the last year. - phoslo x1 today - CK 17, no concern for rhabdomyolysis 2/2 amiodarone - renal diet Restless leg syndrome - gabapentin 300mg  TID  - Restarted home ferrous sulfate 325mg  daily - Requip 0.25mg  daily at bedtime - Percocet 5-325mg  Q6h PRN for now Chronic iron deficiency anemia Iron panel with iron of 13, decreased TIBC 392, decreased ferritin 29. Pt takes ferrous sulfate 325mg  every day at facility. Hemoglobin stable at 9.7 - home ferrous sulfate 325mg  daily - fereheme IV x1 DM2 (diabetes mellitus, type 2) (HCC) 133 this AM, 3U novolog given - resistant sliding scale - jardiance 10mg  daily  - 5U semglee  - continue CBG monitoring before meals and at bedtime for now  Chronic and Stable Problems:  Hypothyroidism - levothyroxine daily HTN - torsemide 40mg  bid, spironolactone 25mg  daily at home  HLD - atorvastatin 20mg  every day OSA - Bipap  COPD - pulmicort  nebulizer BID, goal O2 88-92% Anxiety - wellbutrin 100mg  bid, atarax 25mg  TID PRN  FEN/GI: renal - hyperphosphatemia PPx: eliquis Dispo: LTC  pending cardiology recs   Subjective:  Resting comfortably,  feeling good today.  No acute events overnight  Objective: Temp:  [97.6 F (36.4 C)-98.7 F (37.1 C)] 98.4 F (36.9 C) (09/04 0735) Pulse Rate:  [68-76] 68 (09/04 0904) Resp:  [15-30] 20 (09/04 0904) BP: (91-200)/(58-153) 110/81 (09/04 0735) SpO2:  [90 %-100 %] 91 % (09/04 0735) FiO2 (%):  [60 %] 60 % (09/04 0237) Weight:  [152.2 kg] 152.2 kg (09/04 0549) Physical Exam: General: No acute distress, chronically ill-appearing Cardiovascular: Regular rate and rhythm, systolic murmur Respiratory: No increased work of breathing, on 5 L O2 satting around 92% Extremities: Unchanged from previous exam, chronic bilateral 2+ pitting edema with skin changes  Laboratory: Most recent CBC Lab Results  Component Value Date   WBC 7.1 05/31/2023   HGB 9.7 (L) 05/31/2023   HCT 32.1 (L) 05/31/2023   MCV 96.4 05/31/2023   PLT 315 05/31/2023   Most recent BMP    Latest Ref Rng & Units 06/05/2023    2:44 AM  BMP  Glucose 70 - 99 mg/dL 132   BUN 6 - 20 mg/dL 18   Creatinine 4.40 - 1.00 mg/dL 1.02   Sodium 725 - 366 mmol/L 139   Potassium 3.5 - 5.1 mmol/L 4.0   Chloride 98 - 111 mmol/L 90   CO2 22 - 32 mmol/L 37   Calcium 8.9 - 10.3 mg/dL 8.5     Other pertinent labs  Phosphorus 5.2>5.0>4.9  Imaging/Diagnostic Tests: None  Brave Dack, DO 06/05/2023, 10:31 AM  PGY-1, [facility] FPTS Intern pager: [phone], text pages welcome Secure chat group [facility] Teaching Service

## 2023-06-05 NOTE — Assessment & Plan Note (Addendum)
Patient maintaining heart rate in the 70s. Echo performed LVEF 60-65% with mod AS - Amiodarone loading dose x1 and digoxin x1 on admission for a flutter with RVR, with several additional digoxin doses and put on amiodarone drip.  - transitioned to amiodarone 200mg  BID - Cardiology following, appreciate recs - continue home eliquis 5mg . Holding home metoprolol -PT/OT

## 2023-06-05 NOTE — Assessment & Plan Note (Signed)
-   gabapentin 300mg  TID  - Restarted home ferrous sulfate 325mg  daily - Requip 0.25mg  daily at bedtime - Percocet 5-325mg  Q6h PRN for now

## 2023-06-06 ENCOUNTER — Other Ambulatory Visit: Payer: Self-pay

## 2023-06-06 ENCOUNTER — Encounter (HOSPITAL_COMMUNITY): Payer: Self-pay

## 2023-06-06 DIAGNOSIS — I5033 Acute on chronic diastolic (congestive) heart failure: Secondary | ICD-10-CM | POA: Diagnosis not present

## 2023-06-06 LAB — PHOSPHORUS: Phosphorus: 5.3 mg/dL — ABNORMAL HIGH (ref 2.5–4.6)

## 2023-06-06 LAB — BASIC METABOLIC PANEL
Anion gap: 8 (ref 5–15)
BUN: 23 mg/dL — ABNORMAL HIGH (ref 6–20)
CO2: 39 mmol/L — ABNORMAL HIGH (ref 22–32)
Calcium: 8.6 mg/dL — ABNORMAL LOW (ref 8.9–10.3)
Chloride: 91 mmol/L — ABNORMAL LOW (ref 98–111)
Creatinine, Ser: 1.07 mg/dL — ABNORMAL HIGH (ref 0.44–1.00)
GFR, Estimated: 60 mL/min — ABNORMAL LOW (ref >60)
Glucose, Bld: 163 mg/dL — ABNORMAL HIGH (ref 70–99)
Potassium: 4.1 mmol/L (ref 3.5–5.1)
Sodium: 138 mmol/L (ref 135–145)

## 2023-06-06 LAB — GLUCOSE, CAPILLARY
Glucose-Capillary: 162 mg/dL — ABNORMAL HIGH (ref 70–99)
Glucose-Capillary: 173 mg/dL — ABNORMAL HIGH (ref 70–99)
Glucose-Capillary: 169 mg/dL — ABNORMAL HIGH (ref 70–99)
Glucose-Capillary: 258 mg/dL — ABNORMAL HIGH (ref 70–99)

## 2023-06-06 LAB — MAGNESIUM: Magnesium: 2.3 mg/dL (ref 1.7–2.4)

## 2023-06-06 MED ORDER — ORAL CARE MOUTH RINSE: 15.0000 mL | OROMUCOSAL | Status: DC | PRN

## 2023-06-06 MED ORDER — ORAL CARE MOUTH RINSE
15.0000 mL | OROMUCOSAL | Status: DC
  Administered 2023-06-06 – 2023-06-08 (×5): 15 mL via OROMUCOSAL

## 2023-06-06 MED ORDER — CALCIUM ACETATE (PHOS BINDER) 667 MG PO CAPS
1334.0000 mg | ORAL_CAPSULE | Freq: Three times a day (TID) | ORAL | Status: AC
  Administered 2023-06-06 – 2023-06-07 (×3): 1334 mg via ORAL
  Filled 2023-06-06 (×3): qty 2

## 2023-06-06 MED ORDER — INFLUENZA VIRUS VACC SPLIT PF (FLUZONE) 0.5 ML IM SUSY
0.5000 mL | PREFILLED_SYRINGE | INTRAMUSCULAR | Status: AC
  Administered 2023-06-07: 0.5 mL via INTRAMUSCULAR
  Filled 2023-06-06: qty 0.5

## 2023-06-06 MED ORDER — ACETAMINOPHEN 500 MG PO TABS
1000.0000 mg | ORAL_TABLET | Freq: Once | ORAL | Status: AC
  Administered 2023-06-06: 500 mg via ORAL
  Filled 2023-06-06: qty 2

## 2023-06-06 NOTE — Assessment & Plan Note (Addendum)
Suspect this to be main contributing factor to patient's shortness of breath, probably exacerbated by tachycardia - BNP 721 on admission, initial chest x-ray suspicious for pulmonary edema - Cardiology following, appreciate recs - Strict I's and O's.  Good output, -1750 yesterday - continue spironolactone 25mg , d/c torsemide per cards - K 4.1, Mag 2.3 - Cr 1.07 this am - Lasix 80 mg IV BID per cards - Goal K >4, Mg >2 - BMP, mag in AM

## 2023-06-06 NOTE — Assessment & Plan Note (Signed)
-   gabapentin 300mg  TID  - Restarted home ferrous sulfate 325mg  daily - Requip 0.25mg  daily at bedtime - Percocet 5-325mg  Q6h PRN for now

## 2023-06-06 NOTE — Plan of Care (Signed)

## 2023-06-06 NOTE — Assessment & Plan Note (Addendum)
Patient maintaining heart rate in the 70s. Echo performed LVEF 60-65% with mod AS - Amiodarone loading dose x1 and digoxin x1 on admission for a flutter with RVR, with several additional digoxin doses and put on amiodarone drip. - transitioned to amiodarone 200mg  BID 06/03/23 - Cardiology following, appreciate recs - continue home eliquis 5mg . Holding home metoprolol - PT/OT

## 2023-06-06 NOTE — Assessment & Plan Note (Signed)
Likely multifactorial. 5L this morning (pt's baseline) satting 90%.  - NM perfusion scan negative for PE - Patient requires 5L at home  - Has history of trach placed in July 2023, can consult CCM for respiratory support if condition changes

## 2023-06-06 NOTE — Progress Notes (Signed)
Daily Progress Note Intern Pager: [phone]  Patient name: Chelsea [name] Medical record number: [id number] Date of birth: [birth year] Age: 57 y.o. Gender: female  Primary Care Provider: Pcp, No Consultants: cardiology Code Status: full code  Pt Overview and Major Events to Date:  8/27 - admitted 8/30 - converted to NSR 9/3 - maintaining baseline O2 requirement of 5L  Assessment and Plan:  57 year old female with past medical history of chronic respiratory failure requiring 5 L oxygen at home, COPD, HFpEF, PAF, tracheostomy (reverted in 2023) presenting initially for tachycardia, increased shortness of breath, intermittent chest pain. Patient has had multiple admissions for similar symptoms.    Cardiology following patient and providing recommendations.  Hopeful to transition to PO diuretics as symptoms allow.  Assessment & Plan Acute on chronic diastolic CHF (congestive heart failure), NYHA class 3 (HCC) Suspect this to be main contributing factor to patient's shortness of breath, probably exacerbated by tachycardia - BNP 721 on admission, initial chest x-ray suspicious for pulmonary edema - Cardiology following, appreciate recs - Strict I's and O's.  Good output, -1750 yesterday - continue spironolactone 25mg , d/c torsemide per cards - K 4.1, Mag 2.3 - Cr 1.07 this am - Lasix 80 mg IV BID per cards - Goal K >4, Mg >2 - BMP, mag in AM Atrial flutter with rapid ventricular response (HCC) Patient maintaining heart rate in the 70s. Echo performed LVEF 60-65% with mod AS - Amiodarone loading dose x1 and digoxin x1 on admission for a flutter with RVR, with several additional digoxin doses and put on amiodarone drip. - transitioned to amiodarone 200mg  BID 06/03/23 - Cardiology following, appreciate recs - continue home eliquis 5mg . Holding home metoprolol - PT/OT Hyperphosphatemia Phosphorus 5.2 > 5.0 > 4.7> 5.3 today. Cr 1.07 today. Not on any vitamin D analog medications  inpatient, but looks like she takes 5000U at her facility, maybe this could be contributing along with slightly worsened kidney function due to diuresis. Looks like her phosphate has been around 5 for the last year. - increased dose of phoslo today 1334mg  - renal diet - will check PTH Cough productive of clear sputum (Resolved: 06/06/2023) Began having cough with clear sputum 9/1, this has improved -Chest x-ray with possible atelectasis or infiltrate in L lower lung field, appears unchanged from previous. Afebrile. Lung sounds appear clear on exam.  -Pulmonary toiletry, flutter valve, incentive spirometry -Respiratory viral panel unremarkable Acute on chronic respiratory failure with hypoxia (HCC) Likely multifactorial. 5L this morning (pt's baseline) satting 90%.  - NM perfusion scan negative for PE - Patient requires 5L at home  - Has history of trach placed in July 2023, can consult CCM for respiratory support if condition changes Restless leg syndrome - gabapentin 300mg  TID  - Restarted home ferrous sulfate 325mg  daily - Requip 0.25mg  daily at bedtime - Percocet 5-325mg  Q6h PRN for now DM2 (diabetes mellitus, type 2) (HCC) 162 this AM, 4U novolog given - resistant sliding scale - jardiance 10mg  daily  - 5U semglee  - CBG 4x daily before meals and at bedtime Chronic iron deficiency anemia Iron panel with iron of 13, decreased TIBC 392, decreased ferritin 29. Pt takes ferrous sulfate 325mg  every day at facility. Hemoglobin stable at 9.7 - home ferrous sulfate 325mg  daily - fereheme IV x1  Chronic and Stable Problems:  Hypothyroidism - levothyroxine daily HTN - torsemide 40mg  bid, spironolactone 25mg  daily at home  HLD - atorvastatin 20mg  every day OSA - Bipap  COPD -  pulmicort nebulizer BID, goal O2 88-92% Anxiety - wellbutrin 100mg  bid, atarax 25mg  TID PRN  FEN/GI: renal - hyperphosphatemia PPx: eliquis Dispo: LTC  pending cardiology recs  Subjective:  Patient  feeling well today.  No acute events overnight.  Patient asks if she is on Lasix, states that torsemide works better for her  Objective: Temp:  [98 F (36.7 C)-99 F (37.2 C)] 98.2 F (36.8 C) (09/05 0802) Pulse Rate:  [65-75] 65 (09/05 0802) Resp:  [14-20] 14 (09/05 0802) BP: (99-116)/(66-73) 111/71 (09/05 0802) SpO2:  [92 %-97 %] 93 % (09/05 0802) FiO2 (%):  [50 %] 50 % (09/05 0200) Weight:  [152 kg] 152 kg (09/05 0035) Physical Exam: General: No acute distress, chronically ill-appearing Cardiovascular: Regular rate and rhythm, systolic murmur Respiratory: No increased work of breathing. Clear breath sounds bilaterally. 5L O2 saturating around 92%.  Extremities: Unchanged from previous exam, chronic bilateral 2+ pitting edema with skin changes  Laboratory: Most recent CBC Lab Results  Component Value Date   WBC 7.1 05/31/2023   HGB 9.7 (L) 05/31/2023   HCT 32.1 (L) 05/31/2023   MCV 96.4 05/31/2023   PLT 315 05/31/2023   Most recent BMP    Latest Ref Rng & Units 06/06/2023    2:55 AM  BMP  Glucose 70 - 99 mg/dL 161   BUN 6 - 20 mg/dL 23   Creatinine 0.96 - 1.00 mg/dL 0.45   Sodium 409 - 811 mmol/L 138   Potassium 3.5 - 5.1 mmol/L 4.1   Chloride 98 - 111 mmol/L 91   CO2 22 - 32 mmol/L 39   Calcium 8.9 - 10.3 mg/dL 8.6    Phosphorus: 9.1>4.7>8.2>9.5 today  Imaging/Diagnostic Tests: None  Merryl Buckels, DO 06/06/2023, 10:30 AM  PGY-1, [facility] FPTS Intern pager: [phone], text pages welcome Secure chat group [facility] Teaching Service

## 2023-06-06 NOTE — Plan of Care (Signed)
  Problem: Coping: Goal: Ability to adjust to condition or change in health will improve Outcome: Progressing   Problem: Fluid Volume: Goal: Ability to maintain a balanced intake and output will improve Outcome: Progressing   Problem: Health Behavior/Discharge Planning: Goal: Ability to manage health-related needs will improve Outcome: Progressing   Problem: Metabolic: Goal: Ability to maintain appropriate glucose levels will improve Outcome: Progressing   

## 2023-06-06 NOTE — Assessment & Plan Note (Signed)
162 this AM, 4U novolog given - resistant sliding scale - jardiance 10mg  daily  - 5U semglee  - CBG 4x daily before meals and at bedtime

## 2023-06-06 NOTE — Progress Notes (Signed)
Physical Therapy Treatment Patient Details Name: Chelsea [name] MRN: [id number] DOB: June 29, 1966 Today's Date: 06/06/2023   History of Present Illness 57 yo female presents [facility] on 8/27 with SOB, admitted for aflutter and HF exacerbation. PMH includes: chronic respiratory failure with hypoxia on 5LO2 chronically and bipap at night, CHF, CAD, COPD, OSA on CPAP, afib, HLD, HTN, DM II, morbid obesity, cervical cancer, and chronic back pain.    PT Comments  Pt heavily soiled in stool and urine upon PT arrival to room, awaiting clean up after bed pan use, NT arrived to room to assist with pericare and PT assisted with mobility. Pt overall requiring mod assist for rolling bilat for hip and truncal translation, repeated rolls required for clean up. Pt participated well in LE and UE exercise in chair position in bed, too fatigued to participate in EOB activity at this time. Plan remains appropriate.       If plan is discharge home, recommend the following: Two people to help with walking and/or transfers;A lot of help with bathing/dressing/bathroom   Can travel by private vehicle        Equipment Recommendations  None recommended by PT    Recommendations for Other Services       Precautions / Restrictions Precautions Precautions: Fall Precaution Comments: LUE pain Restrictions Weight Bearing Restrictions: No     Mobility  Bed Mobility Overal bed mobility: Needs Assistance Bed Mobility: Rolling Rolling: Mod assist, +2 for safety/equipment         General bed mobility comments: mod +2 for rolling L and R x3 for pericare, pt soiled heavily in stool and urine. NT in room assisting with pericare. Assist at shoulder and pelvic girdle to facilitate rolling    Transfers                   General transfer comment: pt declines attempt    Ambulation/Gait                   Stairs             Wheelchair Mobility     Tilt Bed    Modified Rankin (Stroke  Patients Only)       Balance Overall balance assessment: Needs assistance Sitting-balance support: Feet supported, No upper extremity supported Sitting balance-Leahy Scale: Fair                                      Cognition Arousal: Alert Behavior During Therapy: WFL for tasks assessed/performed Overall Cognitive Status: Within Functional Limits for tasks assessed                                          Exercises General Exercises - Lower Extremity Ankle Circles/Pumps: AROM, Both, 15 reps, Seated Short Arc Quad: AAROM, 15 reps, Seated, Both (chiar position) Hip ABduction/ADduction: AROM, Both, 15 reps, Seated (chair position) Other Exercises Other Exercises: pull to sit with bilat UE support on bedrails, x10    General Comments        Pertinent Vitals/Pain Pain Assessment Pain Assessment: Faces Faces Pain Scale: Hurts little more Pain Location: LEs, R shoulder during rolling Pain Descriptors / Indicators: Discomfort, Grimacing, Guarding Pain Intervention(s): Limited activity within patient's tolerance, Monitored during session, Repositioned    Home Living  Prior Function            PT Goals (current goals can now be found in the care plan section) Acute Rehab PT Goals PT Goal Formulation: With patient Time For Goal Achievement: 06/17/23 Potential to Achieve Goals: Good Progress towards PT goals: Progressing toward goals    Frequency    Min 1X/week      PT Plan      Co-evaluation              AM-PAC PT "6 Clicks" Mobility   Outcome Measure  Help needed turning from your back to your side while in a flat bed without using bedrails?: A Lot Help needed moving from lying on your back to sitting on the side of a flat bed without using bedrails?: A Lot Help needed moving to and from a bed to a chair (including a wheelchair)?: Total Help needed standing up from a chair using your  arms (e.g., wheelchair or bedside chair)?: Total Help needed to walk in hospital room?: Total Help needed climbing 3-5 steps with a railing? : Total 6 Click Score: 8    End of Session Equipment Utilized During Treatment: Oxygen (5LO2) Activity Tolerance: Patient tolerated treatment well Patient left: in bed;with call bell/phone within reach;with bed alarm set Nurse Communication: Mobility status PT Visit Diagnosis: Other abnormalities of gait and mobility (R26.89);Muscle weakness (generalized) (M62.81)     Time: 4098-1191 PT Time Calculation (min) (ACUTE ONLY): 24 min  Charges:    $Therapeutic Exercise: 8-22 mins $Therapeutic Activity: 8-22 mins PT General Charges $$ ACUTE PT VISIT: 1 Visit                     Marye Round, PT DPT Acute Rehabilitation Services Secure Chat Preferred  Office [phone]    Paulett Kaufhold E Stroup 06/06/2023, 10:50 AM

## 2023-06-06 NOTE — Inpatient Diabetes Management (Signed)
Inpatient Diabetes Program Recommendations  AACE/ADA: New Consensus Statement on Inpatient Glycemic Control   Target Ranges:  Prepandial:   less than 140 mg/dL      Peak postprandial:   less than 180 mg/dL (1-2 hours)      Critically ill patients:  140 - 180 mg/dL    Latest Reference Range & Units 06/05/23 06:39 06/05/23 11:20 06/05/23 16:03 06/05/23 21:12 06/06/23 06:07  Glucose-Capillary 70 - 99 mg/dL 161 (H) 096 (H) 045 (H) 269 (H) 162 (H)   Review of Glycemic Control  Diabetes history: DM2 Outpatient Diabetes medications: Trulicity 0.75 mg Qweek, Fiasp 0-24 units TID, Jardiance 10 mg daily Current orders for Inpatient glycemic control: Semglee 5 units daily, Novolog 0-20 units TID with meals, Jardiance 10 mg daily  Inpatient Diabetes Program Recommendations:    Insulin: Please consider ordering Novolog 3 units TID with meals for meal coverage if patient eats at least 50% of meals and adding Novolog 0-5 units QHS.   Thanks, Orlando Penner, RN, MSN, CDCES Diabetes Coordinator Inpatient Diabetes Program [phone] (Team Pager from 8am to 5pm)

## 2023-06-06 NOTE — Assessment & Plan Note (Addendum)
Phosphorus 5.2 > 5.0 > 4.7> 5.3 today. Cr 1.07 today. Not on any vitamin D analog medications inpatient, but looks like she takes 5000U at her facility, maybe this could be contributing along with slightly worsened kidney function due to diuresis. Looks like her phosphate has been around 5 for the last year. - increased dose of phoslo today 1334mg  - renal diet - will check PTH

## 2023-06-06 NOTE — Assessment & Plan Note (Addendum)
Began having cough with clear sputum 9/1, this has improved -Chest x-ray with possible atelectasis or infiltrate in L lower lung field, appears unchanged from previous. Afebrile. Lung sounds appear clear on exam.  -Pulmonary toiletry, flutter valve, incentive spirometry -Respiratory viral panel unremarkable

## 2023-06-06 NOTE — Assessment & Plan Note (Signed)
Iron panel with iron of 13, decreased TIBC 392, decreased ferritin 29. Pt takes ferrous sulfate 325mg  every day at facility. Hemoglobin stable at 9.7 - home ferrous sulfate 325mg  daily - fereheme IV x1

## 2023-06-07 DIAGNOSIS — I5033 Acute on chronic diastolic (congestive) heart failure: Secondary | ICD-10-CM | POA: Diagnosis not present

## 2023-06-07 LAB — BASIC METABOLIC PANEL
Anion gap: 12 (ref 5–15)
BUN: 21 mg/dL — ABNORMAL HIGH (ref 6–20)
CO2: 38 mmol/L — ABNORMAL HIGH (ref 22–32)
Calcium: 9 mg/dL (ref 8.9–10.3)
Chloride: 89 mmol/L — ABNORMAL LOW (ref 98–111)
Creatinine, Ser: 0.94 mg/dL (ref 0.44–1.00)
GFR, Estimated: >60 mL/min (ref >60)
Glucose, Bld: 149 mg/dL — ABNORMAL HIGH (ref 70–99)
Potassium: 4 mmol/L (ref 3.5–5.1)
Sodium: 139 mmol/L (ref 135–145)

## 2023-06-07 LAB — GLUCOSE, CAPILLARY
Glucose-Capillary: 145 mg/dL — ABNORMAL HIGH (ref 70–99)
Glucose-Capillary: 168 mg/dL — ABNORMAL HIGH (ref 70–99)
Glucose-Capillary: 196 mg/dL — ABNORMAL HIGH (ref 70–99)

## 2023-06-07 LAB — PHOSPHORUS: Phosphorus: 5.6 mg/dL — ABNORMAL HIGH (ref 2.5–4.6)

## 2023-06-07 LAB — MAGNESIUM: Magnesium: 2.4 mg/dL (ref 1.7–2.4)

## 2023-06-07 MED ORDER — TORSEMIDE 20 MG PO TABS
40.0000 mg | ORAL_TABLET | Freq: Two times a day (BID) | ORAL | Status: DC
  Administered 2023-06-07 – 2023-06-08 (×2): 40 mg via ORAL
  Filled 2023-06-07 (×2): qty 2

## 2023-06-07 MED ORDER — CALCIUM ACETATE (PHOS BINDER) 667 MG PO CAPS
1334.0000 mg | ORAL_CAPSULE | Freq: Three times a day (TID) | ORAL | Status: AC
  Administered 2023-06-07 – 2023-06-08 (×3): 1334 mg via ORAL
  Filled 2023-06-07 (×3): qty 2

## 2023-06-07 NOTE — Assessment & Plan Note (Addendum)
145 this AM, 3U novolog given - resistant sliding scale - jardiance 10mg  daily  - 5U semglee  - CBG 4x daily before meals and at bedtime

## 2023-06-07 NOTE — Assessment & Plan Note (Signed)
-   gabapentin 300mg  TID  - Restarted home ferrous sulfate 325mg  daily - Requip 0.25mg  daily at bedtime - Percocet 5-325mg  Q6h PRN for now

## 2023-06-07 NOTE — Assessment & Plan Note (Addendum)
Phosphorus 5.2 > 5.0 > 4.7> 5.3 >5.6 today. Cr WNL today. Not on any vitamin D analog medications inpatient, but looks like she takes 5000U at her facility, maybe this could be contributing along with diuresis. Looks like her phosphate has been around 5 for the last year. - renal diet - PTH pending - phoslo 1334mg  TID with meals

## 2023-06-07 NOTE — Assessment & Plan Note (Addendum)
Likely multifactorial. 4.5L this morning (pt's baseline) satting 91% - NM perfusion scan negative for PE - Patient requires 5L at home  - Has history of trach placed in July 2023, can consult CCM for respiratory support if condition changes

## 2023-06-07 NOTE — Discharge Summary (Shared)
Family Medicine Teaching [facility] Discharge Summary  Patient name: Chelsea [name] Medical record number: [id number] Date of birth: 1965/11/24 Age: 57 y.o. Gender: female Date of Admission: 05/28/2023  Date of Discharge: 06/08/23 Admitting Physician: Peterson Ao, MD  Primary Care Provider: Pcp, No Consultants: cardiology  Indication for Hospitalization: a flutter with RVR, SOB 2/2 acute on chronic CHF exacerbation  Discharge Diagnoses/Problem List:  Principal Problem for Admission: acute on chronic CHF exacerbation, a flutter with RVR Other Problems addressed during stay:  Principal Problem:   Acute on chronic diastolic CHF (congestive heart failure), NYHA class 3 (HCC) Active Problems:   Acute on chronic respiratory failure with hypoxia (HCC)   DM2 (diabetes mellitus, type 2) (HCC)   Chronic iron deficiency anemia   Chronic pain of multiple joints   Restless leg syndrome   COPD (chronic obstructive pulmonary disease) with chronic bronchitis   Atrial flutter with rapid ventricular response [facility])   Anxiety   Hyperphosphatemia    Brief Hospital Course:  Chelsea [name] is a 57 y.o.female with a history of COPD on 5L at home, HFpEF, PAF who was admitted to the [facility] Teaching Service at [facility] for acute on chronic respiratory failure, A flutter with RVR. Her hospital course is detailed below:  Aflutter with RVR Pt presented initially in A flutter with RVR in the 140s. Pt was put on amiodarone drip and received several doses of digoxin. Converted to normal sinus rhythm early on 8/30.  Patient weaned to 200mg  amiodarone twice daily. Will transition to 200 mg daily at home on 9/20. Per cardiology, patient would not be a good candidate for ablation given her current body habitus.  Acute on chronic diastolic CHF exacerbation Pt presented initially with elevated BNP at 721 and CXR suggestive of pulmonary edema. Suspected that her elevated heart rate contributed  to CHF exacerbation. Pt was diuresed initially with Lasix 80mg  BID, this was increased to TID.  With resolution of fluid status, home Spironolactone 25 mg daily and Torsemide 40 mg BID restarted.   Acute on chronic respiratory failure Suspected that this is multifactorial.  VQ scan negative for PE. Patient was initially requiring BiPAP, eventually transitioned to HFNC at 7L, and then to her home O2 at 5 L.  Restless leg syndrome Patient was on gabapentin 400 mg TID at her facility.  Has been taking 300 mg TID during hospitalization.  Had Percocet and Toradol q6 PRN for pain.  Added Requip nightly for restless legs.  Chronic iron deficiency anemia Patient with chronically low iron.  Iron panel here with low iron, TIBC, ferritin.  Hemoglobin also trending down.  We restarted her home iron supplement, added IV Feraheme x1.    Other chronic conditions were medically managed with home medications and formulary alternatives as necessary (COPD, T2DM, anxiety, hypothyroidism, HTN, HLD)  PCP Follow-up Recommendations: Follow up on Requip that was started for RLS  Reassess vitamin D level, discontinued Vitamin D 5000U every day due to hyperphosphatemia. Also f/u on PTH result that was pending at discharge. Discontinued omeprazole, recommend restarting if GERD sx Consider adjusting statin dose, triglycerides 197 Consider PFTs - unclear what her last results were Consider switching to different agent from jardiance if she gets a perineal infection Continue on amiodarone 200 mg twice a day for total of 2 weeks then transition to 200 mg a day after (06/21/23).   Disposition: [facility] (LTC)  Discharge Condition: stable  Discharge Exam:  Vitals:   06/08/23 0726 06/08/23  0836  BP: (!) 111/58   Pulse: 61   Resp: 18   Temp: 97.8 F (36.6 C)   SpO2: 95% 97%   General: Obese 57 year old female female, lying in bed with BiPAP on (was initially still sleeping), NAD Cardiovascular: RRR,  systolic murmur Respiratory: CTAB, normal effort, on BiPAP w/ 4 L O2 which she wears nightly Abdomen: Soft, nodular outpatient, nondistended Extremities: Nonpitting edema BLEs  Significant Procedures:  ECHO  1. Left ventricular ejection fraction, by estimation, is 60 to 65%. The  left ventricle has normal function. The left ventricle has no regional  wall motion abnormalities. Left ventricular diastolic parameters were  normal.   2. Right ventricular systolic function is normal. The right ventricular  size is normal.   3. Left atrial size was mild to moderately dilated.   4. The mitral valve is degenerative. No evidence of mitral valve  regurgitation.   5. The aortic valve is calcified. Aortic valve regurgitation is trivial.  Moderate aortic valve stenosis.   6. The inferior vena cava is dilated in size with >50% respiratory  variability, suggesting right atrial pressure of 8 mmHg.   Significant Labs and Imaging:  No results for input(s): "WBC", "HGB", "HCT", "PLT" in the last 48 hours. Recent Labs  Lab 06/07/23 0404 06/08/23 0405  NA 139 136  K 4.0 3.6  CL 89* 90*  CO2 38* 35*  GLUCOSE 149* 176*  BUN 21* 20  CREATININE 0.94 0.92  CALCIUM 9.0 8.4*  MG 2.4 2.2  PHOS 5.6* 5.5*   CXR 06/03/23 IMPRESSION: 1. Airspace disease in the mid to lower left lung field, possible atelectasis or infiltrate. 2. Small left pleural effusion. 3. Cardiomegaly.   NM Pulm Perfusion 05/29/23 Negative for PE  CXR 05/28/23 IMPRESSION: Cardiac enlargement with increasing bilateral pulmonary infiltrates.  Results/Tests Pending at Time of Discharge: PTH lab  Discharge Medications:  Allergies as of 06/08/2023       Reactions   Iodinated Contrast Media Anaphylaxis   Penicillins Anaphylaxis   Patient tolerates cefepime (08/2017)   Insulin Lispro Other (See Comments)   Adder per MAR from SNF   Minoxidil Hives        Medication List     STOP taking these medications    cefTRIAXone 1  g injection Commonly known as: ROCEPHIN   Fiasp FlexTouch 100 UNIT/ML FlexTouch Pen Generic drug: insulin aspart   methylPREDNISolone sodium succinate 125 MG injection ACT-O-VIAL Commonly known as: SOLU-MEDROL   metoprolol succinate 25 MG 24 hr tablet Commonly known as: TOPROL-XL   potassium chloride SA 20 MEQ tablet Commonly known as: KLOR-CON M   Trulicity 0.75 MG/0.5ML Sopn Generic drug: Dulaglutide       TAKE these medications    albuterol 108 (90 Base) MCG/ACT inhaler Commonly known as: VENTOLIN HFA Inhale 2 puffs into the lungs every 4 (four) hours as needed for wheezing or shortness of breath.   albuterol (2.5 MG/3ML) 0.083% nebulizer solution Commonly known as: PROVENTIL Take 3 mLs (2.5 mg total) by nebulization every 6 (six) hours as needed for wheezing or shortness of breath (I50.32).   amiodarone 200 MG tablet Commonly known as: PACERONE Take 1 tablet (200 mg total) by mouth 2 (two) times daily.   atorvastatin 20 MG tablet Commonly known as: LIPITOR Take 20 mg by mouth every evening.   budesonide 0.5 MG/2ML nebulizer solution Commonly known as: PULMICORT Inhale 0.5 mg into the lungs every 12 (twelve) hours.   buPROPion ER 100 MG 12 hr tablet Commonly  known as: WELLBUTRIN SR Take 100 mg by mouth 2 (two) times daily.   Cholecalciferol 125 MCG (5000 UT) Tabs Take 1 tablet (5,000 Units total) by mouth daily.   Eliquis 5 MG Tabs tablet Generic drug: apixaban Take 5 mg by mouth 2 (two) times daily.   empagliflozin 10 MG Tabs tablet Commonly known as: JARDIANCE Take 1 tablet (10 mg total) by mouth daily.   ferrous sulfate 325 (65 FE) MG EC tablet Take 325 mg by mouth daily.   gabapentin 400 MG capsule Commonly known as: NEURONTIN Take 400 mg by mouth 3 (three) times daily.   hydrOXYzine 25 MG tablet Commonly known as: ATARAX Take 25 mg by mouth in the morning and at bedtime.   levothyroxine 150 MCG tablet Commonly known as: SYNTHROID Take 1  tablet (150 mcg total) by mouth daily at 6 (six) AM. What changed: when to take this   multivitamin tablet Take 1 tablet by mouth daily.   NON FORMULARY Apply BIPAP every night at bedtime.   omeprazole 20 MG capsule Commonly known as: PRILOSEC Take 20 mg by mouth daily.   oxyCODONE-acetaminophen 10-325 MG tablet Commonly known as: PERCOCET Take 1 tablet by mouth every 6 (six) hours.   OXYGEN Inhale 5 L/min into the lungs in the morning and at bedtime. Connect to BIPAP qhs   spironolactone 25 MG tablet Commonly known as: ALDACTONE Take 1 tablet (25 mg total) by mouth daily.   torsemide 20 MG tablet Commonly known as: DEMADEX Take 2 tablets (40 mg total) by mouth 2 (two) times daily.        Discharge Instructions: Please refer to Patient Instructions section of EMR for full details.  Patient was counseled important signs and symptoms that should prompt return to medical care, changes in medications, dietary instructions, activity restrictions, and follow up appointments.   Follow-Up Appointments:  Contact information for after-discharge care     Destination     [facility] Preferred SNF .   Service: Skilled Nursing Contact information: [address] [place] [address] [phone]                     Erick Alley, DO 06/08/2023, 10:25 AM PGY-3, [facility]

## 2023-06-07 NOTE — Assessment & Plan Note (Signed)
Patient maintaining heart rate in the 70s. Echo performed LVEF 60-65% with mod AS - Amiodarone loading dose x1 and digoxin x1 on admission for a flutter with RVR, with several additional digoxin doses and put on amiodarone drip. - transitioned to amiodarone 200mg  BID 06/03/23 - Cardiology following, appreciate recs - continue home eliquis 5mg . Holding home metoprolol - PT/OT

## 2023-06-07 NOTE — Progress Notes (Signed)
   06/07/23 0012  BiPAP/CPAP/SIPAP  BiPAP/CPAP/SIPAP Pt Type Adult  BiPAP/CPAP/SIPAP SERVO  Reason BIPAP/CPAP not in use Non-compliant  Patient Home Equipment No  BiPAP/CPAP /SiPAP Vitals  Resp 19  SpO2 92 %  MEWS Score/Color  MEWS Score 1  MEWS Score Color Green

## 2023-06-07 NOTE — Plan of Care (Signed)

## 2023-06-07 NOTE — Assessment & Plan Note (Addendum)
Suspect this to be main contributing factor to patient's shortness of breath, probably exacerbated by tachycardia - BNP 721 on admission, initial chest x-ray suspicious for pulmonary edema - Cardiology following, appreciate recs - Strict I's and O's.  output yesterday.  - d/c lasix, restart home torsemide 40mg  BID  - continue spironolactone 25mg  - K 4.0, Mag 2.4 - Cr 0.94 this am - Goal K >4, Mg >2 - BMP, mag in AM

## 2023-06-07 NOTE — Progress Notes (Signed)
Daily Progress Note Intern Pager: [phone]  Patient name: Chelsea [name] Medical record number: [id number] Date of birth: March 29, 1966 Age: 57 y.o. Gender: female  Primary Care Provider: Pcp, No Consultants: cardiology Code Status: full code  Pt Overview and Major Events to Date:  8/27 - admitted 8/30 - converted to NSR  Assessment and Plan:  57 year old female with past medical history of chronic respiratory failure requiring 5 L oxygen at home, COPD, HFpEF, PAF, tracheostomy (reverted in 2023) presenting initially for tachycardia, increased shortness of breath, intermittent chest pain. Patient has had multiple admissions for similar symptoms.   Pt still with BLE edema. However respiratory symptoms much improved since admission. Great output of yesterday. Pt continues amiodarone 200mg  BID. Switching to PO torsemide 40mg  BID (home dose) today per cardiology, plan for discharge tomorrow.  Assessment & Plan Acute on chronic diastolic CHF (congestive heart failure), NYHA class 3 (HCC) Suspect this to be main contributing factor to patient's shortness of breath, probably exacerbated by tachycardia - BNP 721 on admission, initial chest x-ray suspicious for pulmonary edema - Cardiology following, appreciate recs - Strict I's and O's.  output yesterday.  - d/c lasix, restart home torsemide 40mg  BID  - continue spironolactone 25mg  - K 4.0, Mag 2.4 - Cr 0.94 this am - Goal K >4, Mg >2 - BMP, mag in AM Atrial flutter with rapid ventricular response (HCC) Patient maintaining heart rate in the 70s. Echo performed LVEF 60-65% with mod AS - Amiodarone loading dose x1 and digoxin x1 on admission for a flutter with RVR, with several additional digoxin doses and put on amiodarone drip. - transitioned to amiodarone 200mg  BID 06/03/23 - Cardiology following, appreciate recs - continue home eliquis 5mg . Holding home metoprolol - PT/OT Hyperphosphatemia Phosphorus 5.2 > 5.0 >  4.7> 5.3 >5.6 today. Cr WNL today. Not on any vitamin D analog medications inpatient, but looks like she takes 5000U at her facility, maybe this could be contributing along with diuresis. Looks like her phosphate has been around 5 for the last year. - renal diet - PTH pending - phoslo 1334mg  TID with meals Acute on chronic respiratory failure with hypoxia (HCC) Likely multifactorial. 4.5L this morning (pt's baseline) satting 91% - NM perfusion scan negative for PE - Patient requires 5L at home  - Has history of trach placed in July 2023, can consult CCM for respiratory support if condition changes Restless leg syndrome - gabapentin 300mg  TID  - Restarted home ferrous sulfate 325mg  daily - Requip 0.25mg  daily at bedtime - Percocet 5-325mg  Q6h PRN for now DM2 (diabetes mellitus, type 2) (HCC) 145 this AM, 3U novolog given - resistant sliding scale - jardiance 10mg  daily  - 5U semglee  - CBG 4x daily before meals and at bedtime Chronic iron deficiency anemia Iron panel with iron of 13, decreased TIBC 392, decreased ferritin 29. Pt takes ferrous sulfate 325mg  every day at facility. Hemoglobin stable at 9.7 - home ferrous sulfate 325mg  daily - fereheme IV x1  Chronic and Stable Problems:  Hypothyroidism - levothyroxine daily HTN - torsemide 40mg  bid, spironolactone 25mg  daily at home  HLD - atorvastatin 20mg  every day OSA - Bipap  COPD - pulmicort nebulizer BID, goal O2 88-92% Anxiety - wellbutrin 100mg  bid, atarax 25mg  TID PRN   FEN/GI: renal with fluid restriction  PPx: eliquis Dispo: LTC  pending clinical improvement   Subjective:  Using the bedpan both times I tried to see patient.  However states that  she is doing well and has no complaints  Objective: Temp:  [97.7 F (36.5 C)-98.5 F (36.9 C)] 97.7 F (36.5 C) (09/06 0623) Pulse Rate:  [57-67] 61 (09/06 0719) Resp:  [18-22] 20 (09/06 0623) BP: (96-99)/(59-69) 97/66 (09/06 0104) SpO2:  [90 %-99 %] 93 % (09/06  0623) Weight:  [150 kg] 150 kg (09/06 4098) Physical Exam: General: Laying in bed, no acute distress Respiratory: No increased work of breathing, on 4.5 L Extremities: Unchanged from prior exam, 2+ pitting edema bilaterally  Laboratory: Most recent CBC Lab Results  Component Value Date   WBC 7.1 05/31/2023   HGB 9.7 (L) 05/31/2023   HCT 32.1 (L) 05/31/2023   MCV 96.4 05/31/2023   PLT 315 05/31/2023   Most recent BMP    Latest Ref Rng & Units 06/07/2023    4:04 AM  BMP  Glucose 70 - 99 mg/dL 119   BUN 6 - 20 mg/dL 21   Creatinine 1.47 - 1.00 mg/dL 8.29   Sodium 562 - 130 mmol/L 139   Potassium 3.5 - 5.1 mmol/L 4.0   Chloride 98 - 111 mmol/L 89   CO2 22 - 32 mmol/L 38   Calcium 8.9 - 10.3 mg/dL 9.0    Other pertinent labs  Phosphorus 5.6 Mag 2.4   Imaging/Diagnostic Tests: None  Devony Mcgrady, DO 06/07/2023, 11:32 AM  PGY-1, [facility] FPTS Intern pager: [phone], text pages welcome Secure chat group [facility] Teaching Service

## 2023-06-07 NOTE — Progress Notes (Signed)
CSW confirm with pt medical team and SNF that pt will return to [facility] tomorrow. Weekend CSW to call Kia with [facility] [phone] when pt is ready for dc.

## 2023-06-07 NOTE — Assessment & Plan Note (Signed)
Iron panel with iron of 13, decreased TIBC 392, decreased ferritin 29. Pt takes ferrous sulfate 325mg  every day at facility. Hemoglobin stable at 9.7 - home ferrous sulfate 325mg  daily - fereheme IV x1

## 2023-06-07 NOTE — Progress Notes (Signed)
   Patient Name: Chelsea [name] Date of Encounter: 06/07/2023 [facility] Cardiologist: Donato Schultz, MD   Interval Summary  .    Laying fairly comfortably in bed.  Vital Signs .    Vitals:   06/07/23 0103 06/07/23 0104 06/07/23 0623 06/07/23 0719  BP:  97/66    Pulse:  65 (!) 57 61  Resp: 19 19 20    Temp:   97.7 F (36.5 C)   TempSrc:   Oral   SpO2: 93% 92% 93%   Weight:   (!) 150 kg   Height:        Intake/Output Summary (Last 24 hours) at 06/07/2023 0855 Last data filed at 06/07/2023 0630 Gross per 24 hour  Intake 234.77 ml  Output 3150 ml  Net -2915.23 ml      06/07/2023    6:23 AM 06/06/2023   12:35 AM 06/05/2023    5:49 AM  Last 3 Weights  Weight (lbs) 330 lb 11 oz 335 lb 1.6 oz 335 lb 8.6 oz  Weight (kg) 150 kg 152 kg 152.2 kg      Telemetry/ECG    Sinus rhythm- Personally Reviewed, chronic minimal ST segment depressions noted  Echo-EF 65%, moderate aortic valve stenosis  Physical Exam .   GEN: No acute distress.   Neck: No JVD Cardiac: RRR, 3/6 systolic murmur,no rubs, or gallops.  Respiratory: Clear to auscultation bilaterally. GI: Soft, nontender, non-distended  MS: 1-2+ chronic lower extremity edema  Assessment & Plan .     57 year old with morbid obesity here with chronic respiratory failure 5 L at home COPD paroxysmal atrial fibrillation prior tracheostomy with atrial fibrillation flutter.  Paroxysmal atrial fibrillation and flutter with rapid ventricular res - Amiodarone 200 mg twice a day Normal TSH, normal liver function would not be a candidate for ablation based upon her body habitus.  Acute on chronic diastolic heart failure - Mostly driven by morbid obesity - Continues with excellent overall diuresis. - Continue with IV Lasix 80 mg twice a day spironolactone 25 mg a day Jardiance 10 mg (be careful with possible infection risk) -When ready to transition from IV Lasix, place her back on torsemide 40 mg twice a day this is what  she would be on at home. -Weight 345 on admission, 330 today  Moderate aortic stenosis - Murmur heard on exam.  Confirmed by echocardiogram.  Mean gradient 18.  Nonsurgical.  Challenging to determine what exactly her endpoint is.        For questions or updates, please contact [facility] Please consult [email or web address] for contact info under        Signed, Donato Schultz, MD

## 2023-06-08 DIAGNOSIS — I5033 Acute on chronic diastolic (congestive) heart failure: Secondary | ICD-10-CM | POA: Diagnosis not present

## 2023-06-08 LAB — GLUCOSE, CAPILLARY
Glucose-Capillary: 211 mg/dL — ABNORMAL HIGH (ref 70–99)
Glucose-Capillary: 171 mg/dL — ABNORMAL HIGH (ref 70–99)
Glucose-Capillary: 193 mg/dL — ABNORMAL HIGH (ref 70–99)
Glucose-Capillary: 157 mg/dL — ABNORMAL HIGH (ref 70–99)

## 2023-06-08 LAB — MAGNESIUM: Magnesium: 2.2 mg/dL (ref 1.7–2.4)

## 2023-06-08 LAB — BASIC METABOLIC PANEL
Anion gap: 11 (ref 5–15)
BUN: 20 mg/dL (ref 6–20)
CO2: 35 mmol/L — ABNORMAL HIGH (ref 22–32)
Calcium: 8.4 mg/dL — ABNORMAL LOW (ref 8.9–10.3)
Chloride: 90 mmol/L — ABNORMAL LOW (ref 98–111)
Creatinine, Ser: 0.92 mg/dL (ref 0.44–1.00)
GFR, Estimated: >60 mL/min (ref >60)
Glucose, Bld: 176 mg/dL — ABNORMAL HIGH (ref 70–99)
Potassium: 3.6 mmol/L (ref 3.5–5.1)
Sodium: 136 mmol/L (ref 135–145)

## 2023-06-08 LAB — PHOSPHORUS: Phosphorus: 5.5 mg/dL — ABNORMAL HIGH (ref 2.5–4.6)

## 2023-06-08 MED ORDER — BUPROPION HCL ER (SR) 100 MG PO TB12: 100.0000 mg | ORAL_TABLET | Freq: Two times a day (BID) | ORAL | 0 refills | Status: AC

## 2023-06-08 MED ORDER — AMIODARONE HCL 200 MG PO TABS: 200.0000 mg | ORAL_TABLET | Freq: Two times a day (BID) | ORAL | Status: DC

## 2023-06-08 MED ORDER — HYDROXYZINE HCL 25 MG PO TABS: 25.0000 mg | ORAL_TABLET | Freq: Two times a day (BID) | ORAL | 0 refills | Status: DC

## 2023-06-08 MED ORDER — OXYCODONE-ACETAMINOPHEN 10-325 MG PO TABS: 1.0000 | ORAL_TABLET | Freq: Four times a day (QID) | ORAL | 0 refills | Status: DC

## 2023-06-08 NOTE — Assessment & Plan Note (Addendum)
Phosphorus remains elevated at 5.5 today. Cr WNL today. Not on any vitamin D analog medications inpatient, but looks like she takes 5000U at her facility, maybe this could be contributing along with diuresis. Looks like her phosphate has been around 5 for the last year. - renal diet - PTH pending - phoslo 1334mg  TID with meals

## 2023-06-08 NOTE — Assessment & Plan Note (Signed)
-   gabapentin 300mg  TID  - Restarted home ferrous sulfate 325mg  daily - Requip 0.25mg  daily at bedtime - Percocet 5-325mg  Q6h PRN for now

## 2023-06-08 NOTE — Progress Notes (Signed)
Daily Progress Note Intern Pager: [phone]  Patient name: Chelsea [name] Medical record number: [id number] Date of birth: 09/12/1966 Age: 57 y.o. Gender: female  Primary Care Provider: Pcp, No Consultants: Cardiology Code Status: Full code   Pt Overview and Major Events to Date:  8/27- admitted 8/30 - Converted to NSR  Assessment and Plan: 57 year old female with past medical history of chronic respiratory failure requiring 5 L oxygen at home, COPD, HFpEF, PAF, tracheostomy (reverted in 2023) presenting initially for tachycardia, increased shortness of breath, intermittent chest pain in setting of CHF exacerbation and a flutter w/ RVR Assessment & Plan Acute on chronic diastolic CHF (congestive heart failure), NYHA class 3 (HCC) Now on home O2. 24 hr UOP 3.8 L. S/p lasix 80 mg IV followed by torsemide 40 mg oral yesterday.  - BNP 721 on admission, initial chest x-ray suspicious for pulmonary edema - Cardiology following, appreciate recs - Strict I/O's - Continue home torsemide 40mg  BID  - continue spironolactone 25mg  - K 3.6 (repleted), Mag 2.2  - Cr 0.94 this am - Goal K >4, Mg >2 - BMP, mag in AM - PT/OT Atrial flutter with rapid ventricular response (HCC) Patient maintaining heart rate in the 60-70s. Echo performed LVEF 60-65% with mod AS - Continue amiodarone 200mg  BID 06/03/23 - Cardiology following, appreciate recs - continue home eliquis 5mg . Holding home metoprolol Hyperphosphatemia Phosphorus remains elevated at 5.5 today. Cr WNL today. Not on any vitamin D analog medications inpatient, but looks like she takes 5000U at her facility, maybe this could be contributing along with diuresis. Looks like her phosphate has been around 5 for the last year. - renal diet - PTH pending - phoslo 1334mg  TID with meals Acute on chronic respiratory failure with hypoxia (HCC) Likely multifactorial. 4.5L this morning (pt's baseline) satting 91% - NM perfusion scan negative  for PE - Patient requires 5L at home  - Has history of trach placed in July 2023, can consult CCM for respiratory support if condition changes Restless leg syndrome - gabapentin 300mg  TID  - Restarted home ferrous sulfate 325mg  daily - Requip 0.25mg  daily at bedtime - Percocet 5-325mg  Q6h PRN for now DM2 (diabetes mellitus, type 2) (HCC) 176 this AM, s/p 5 U semglee and 8 U aspart, and jardiance - resistant sliding scale - jardiance 10mg  daily  - 5U semglee  - CBG 4x daily before meals and at bedtime Chronic iron deficiency anemia Iron panel with iron of 13, decreased TIBC 392, decreased ferritin 29. Pt takes ferrous sulfate 325mg  every day at facility. S/p IV feraheme on 05/30/23. Hemoglobin stable at 9.7 05/31/23 - home ferrous sulfate 325mg  daily    Chronic and Stable Issues: Hypothyroidism - levothyroxine daily HTN - torsemide 40mg  bid, spironolactone 25mg  daily at home  HLD - atorvastatin 20mg  every day OSA - Bipap  COPD - pulmicort nebulizer BID, goal O2 88-92% Anxiety - wellbutrin 100mg  bid, atarax 25mg  TID PRN  FEN/GI: Renal PPx: Eliquis Dispo:SNF, likely today  Subjective:  Patient states she is feeling okay this morning, no concerns or complaints.  Objective: Temp:  [97.7 F (36.5 C)-98.2 F (36.8 C)] 98.2 F (36.8 C) (09/06 1959) Pulse Rate:  [57-69] 69 (09/06 1959) Resp:  [14-20] 18 (09/07 0030) BP: (83-110)/(54-71) 92/54 (09/07 0030) SpO2:  [91 %-98 %] 95 % (09/07 0030) Weight:  [150 kg] 150 kg (09/06 1308) Physical Exam: General: Obese 57 year old female, lying in bed with BiPAP on (was initially still sleeping), NAD Cardiovascular:  RRR, systolic murmur Respiratory: CTAB, normal effort, on BiPAP w/ 4 L O2 which she wears nightly Abdomen: Soft, nodular outpatient, nondistended Extremities: Nonpitting edema BLEs  Laboratory: Most recent CBC Lab Results  Component Value Date   WBC 7.1 05/31/2023   HGB 9.7 (L) 05/31/2023   HCT 32.1 (L)  05/31/2023   MCV 96.4 05/31/2023   PLT 315 05/31/2023   Most recent BMP    Latest Ref Rng & Units 06/07/2023    4:04 AM  BMP  Glucose 70 - 99 mg/dL 628   BUN 6 - 20 mg/dL 21   Creatinine 3.15 - 1.00 mg/dL 1.76   Sodium 160 - 737 mmol/L 139   Potassium 3.5 - 5.1 mmol/L 4.0   Chloride 98 - 111 mmol/L 89   CO2 22 - 32 mmol/L 38   Calcium 8.9 - 10.3 mg/dL 9.0      Erick Alley, DO 06/08/2023, 1:08 AM  PGY-3, [facility] FPTS Intern pager: [phone], text pages welcome Secure chat group [facility] Teaching Service

## 2023-06-08 NOTE — Assessment & Plan Note (Addendum)
176 this AM, s/p 5 U semglee and 8 U aspart, and jardiance - resistant sliding scale - jardiance 10mg  daily  - 5U semglee  - CBG 4x daily before meals and at bedtime

## 2023-06-08 NOTE — Assessment & Plan Note (Addendum)
Now on home O2. 24 hr UOP 3.8 L. S/p lasix 80 mg IV followed by torsemide 40 mg oral yesterday.  - BNP 721 on admission, initial chest x-ray suspicious for pulmonary edema - Cardiology following, appreciate recs - Strict I/O's - Continue home torsemide 40mg  BID  - continue spironolactone 25mg  - K 3.6 (repleted), Mag 2.2  - Cr 0.94 this am - Goal K >4, Mg >2 - BMP, mag in AM - PT/OT

## 2023-06-08 NOTE — Progress Notes (Addendum)
0809: upon entering room to do morning assessment and give medications, patient reports she is having BM at this time- states it is coming out like diarrhea. Reports that bedpan was not given in time. States she will press call button when done to be cleaned up.  0820: Notice patient had not called out. Upon entering room, patient stated she was still having a BM.  0850: Primary nurse and nurse aide present at bedside to clean patient. Full linen change performed.

## 2023-06-08 NOTE — TOC Transition Note (Signed)
Transition of Care [facility]) - CM/SW Discharge Note   Patient Details  Name: Chelsea [name] MRN: [id number] Date of Birth: Jan 18, 1966  Transition of [facility]) CM/SW Contact:  Helene Kelp, LCSW Phone Number: 06/08/2023, 1:09 PM   Clinical Narrative:    TOC CSW followed-up the pt's disposition (Return to facility - [facility]).   This Clinical research associate made contacted with the facility rep Kia / [phone] regarding the patient's disposition. This Clinical research associate reviewed pt clinical updates per the providers and noted the patient is medically clear to discharge.   Kia noted the patient can return to the facility and provided the nurse report # - [phone].  This Clinical research associate updated the clinical team via chat.   This Clinical research associate contacted the patient's natural support (Legal Guardian/ [name] [phone] or [phone] to update him to the patient's discharge and transfer to the facility. Mr. [name] expressed gratitude with the update of the pt disposition update to the noted facility [facility]).   This Clinical research associate met with the patient at the bedside to update her disposition.   This Clinical research associate made contact with [facility], spoke with the dispatch operator and arranged transport for the patient to the facility.   CSW updated the clinical team and provided the discharge packet.   No other needs identified of this Clinical research associate. Please contact TOC if there is additional disposition support needed.    Final next level of care: Skilled Nursing Facility [facility]))     Patient Goals and CMS Choice CMS Medicare.gov Compare Post Acute Care list provided to:: Patient    Discharge Placement                      Patient and family notified of of transfer: 06/08/23  Discharge Plan and Services Additional resources added to the After Visit Summary for                                       Social Determinants of Health (SDOH) Interventions SDOH Screenings   Food  Insecurity: No Food Insecurity (06/06/2023)  Housing: Medium Risk (06/06/2023)  Transportation Needs: No Transportation Needs (06/06/2023)  Utilities: At Risk (06/06/2023)  Alcohol Screen: Low Risk  (07/05/2022)  Financial Resource Strain: Low Risk  (07/05/2022)  Physical Activity: Insufficiently Active (03/27/2021)   Received from [facility], [facility]  Social Connections: Unknown (02/04/2022)   Received from [facility], [facility]  Stress: No Stress Concern Present (09/15/2021)   Received from [facility], [facility]  Tobacco Use: Low Risk  (06/06/2023)     Readmission Risk Interventions    07/23/2022    2:19 PM 04/09/2022    4:17 PM  Readmission Risk Prevention Plan  Transportation Screening Complete Complete  PCP or Specialist Appt within 3-5 Days  Complete  HRI or Home Care Consult  Complete  Social Work Consult for Recovery Care Planning/Counseling  Complete  Palliative Care Screening  Not Applicable  Medication Review [employer]) Complete Referral to Pharmacy  PCP or Specialist appointment within 3-5 days of discharge Complete   HRI or Home Care Consult Complete   SW Recovery Care/Counseling Consult Complete   Palliative Care Screening Not Applicable   Skilled Nursing Facility Complete

## 2023-06-08 NOTE — Plan of Care (Signed)

## 2023-06-08 NOTE — Assessment & Plan Note (Signed)
Likely multifactorial. 4.5L this morning (pt's baseline) satting 91% - NM perfusion scan negative for PE - Patient requires 5L at home  - Has history of trach placed in July 2023, can consult CCM for respiratory support if condition changes

## 2023-06-08 NOTE — Assessment & Plan Note (Addendum)
Patient maintaining heart rate in the 60-70s. Echo performed LVEF 60-65% with mod AS - Continue amiodarone 200mg  BID 06/03/23 - Cardiology following, appreciate recs - continue home eliquis 5mg . Holding home metoprolol

## 2023-06-08 NOTE — Progress Notes (Signed)
Report given to [facility] at [facility]. She was informed that [facility] will be here at 1430 to transport patient back.

## 2023-06-08 NOTE — Progress Notes (Signed)
[facility] at bedside to take patient to facility. Informed of when last pain medication was given.

## 2023-06-08 NOTE — Assessment & Plan Note (Addendum)
Iron panel with iron of 13, decreased TIBC 392, decreased ferritin 29. Pt takes ferrous sulfate 325mg  every day at facility. S/p IV feraheme on 05/30/23. Hemoglobin stable at 9.7 05/31/23 - home ferrous sulfate 325mg  daily

## 2023-06-09 LAB — PARATHYROID HORMONE, INTACT (NO CA): PTH: 18 pg/mL (ref 15–65)

## 2023-06-14 ENCOUNTER — Encounter (HOSPITAL_COMMUNITY): Payer: Self-pay

## 2023-06-14 ENCOUNTER — Other Ambulatory Visit: Payer: Self-pay

## 2023-06-14 ENCOUNTER — Emergency Department (HOSPITAL_COMMUNITY): Payer: Medicare Other

## 2023-06-14 ENCOUNTER — Inpatient Hospital Stay (HOSPITAL_COMMUNITY)
Admission: EM | Admit: 2023-06-14 | Discharge: 2023-06-24 | DRG: 189 | Disposition: A | Discharge status: Skilled Nursing Facility | Payer: Medicare Other | Source: Skilled Nursing Facility | Attending: Family Medicine | Admitting: Family Medicine

## 2023-06-14 DIAGNOSIS — B9562 Methicillin resistant Staphylococcus aureus infection as the cause of diseases classified elsewhere: Secondary | ICD-10-CM | POA: Diagnosis present

## 2023-06-14 DIAGNOSIS — I5043 Acute on chronic combined systolic (congestive) and diastolic (congestive) heart failure: Secondary | ICD-10-CM | POA: Diagnosis present

## 2023-06-14 DIAGNOSIS — M545 Low back pain, unspecified: Secondary | ICD-10-CM | POA: Diagnosis present

## 2023-06-14 DIAGNOSIS — E1142 Type 2 diabetes mellitus with diabetic polyneuropathy: Secondary | ICD-10-CM | POA: Diagnosis present

## 2023-06-14 DIAGNOSIS — E662 Morbid (severe) obesity with alveolar hypoventilation: Secondary | ICD-10-CM | POA: Diagnosis present

## 2023-06-14 DIAGNOSIS — E782 Mixed hyperlipidemia: Secondary | ICD-10-CM | POA: Diagnosis present

## 2023-06-14 DIAGNOSIS — I480 Paroxysmal atrial fibrillation: Secondary | ICD-10-CM | POA: Diagnosis present

## 2023-06-14 DIAGNOSIS — J9622 Acute and chronic respiratory failure with hypercapnia: Secondary | ICD-10-CM | POA: Diagnosis present

## 2023-06-14 DIAGNOSIS — J9601 Acute respiratory failure with hypoxia: Secondary | ICD-10-CM | POA: Diagnosis present

## 2023-06-14 DIAGNOSIS — Z8541 Personal history of malignant neoplasm of cervix uteri: Secondary | ICD-10-CM

## 2023-06-14 DIAGNOSIS — Y95 Nosocomial condition: Secondary | ICD-10-CM | POA: Diagnosis present

## 2023-06-14 DIAGNOSIS — N179 Acute kidney failure, unspecified: Secondary | ICD-10-CM | POA: Diagnosis present

## 2023-06-14 DIAGNOSIS — Z8679 Personal history of other diseases of the circulatory system: Secondary | ICD-10-CM | POA: Diagnosis not present

## 2023-06-14 DIAGNOSIS — E1165 Type 2 diabetes mellitus with hyperglycemia: Secondary | ICD-10-CM | POA: Diagnosis present

## 2023-06-14 DIAGNOSIS — I5042 Chronic combined systolic (congestive) and diastolic (congestive) heart failure: Secondary | ICD-10-CM | POA: Diagnosis present

## 2023-06-14 DIAGNOSIS — E119 Type 2 diabetes mellitus without complications: Secondary | ICD-10-CM | POA: Diagnosis not present

## 2023-06-14 DIAGNOSIS — I878 Other specified disorders of veins: Secondary | ICD-10-CM | POA: Diagnosis present

## 2023-06-14 DIAGNOSIS — G2581 Restless legs syndrome: Secondary | ICD-10-CM | POA: Diagnosis present

## 2023-06-14 DIAGNOSIS — Z9981 Dependence on supplemental oxygen: Secondary | ICD-10-CM

## 2023-06-14 DIAGNOSIS — D509 Iron deficiency anemia, unspecified: Secondary | ICD-10-CM | POA: Diagnosis present

## 2023-06-14 DIAGNOSIS — J440 Chronic obstructive pulmonary disease with acute lower respiratory infection: Secondary | ICD-10-CM | POA: Diagnosis present

## 2023-06-14 DIAGNOSIS — R0689 Other abnormalities of breathing: Secondary | ICD-10-CM | POA: Diagnosis present

## 2023-06-14 DIAGNOSIS — E876 Hypokalemia: Secondary | ICD-10-CM | POA: Diagnosis present

## 2023-06-14 DIAGNOSIS — Z1152 Encounter for screening for COVID-19: Secondary | ICD-10-CM

## 2023-06-14 DIAGNOSIS — Z91041 Radiographic dye allergy status: Secondary | ICD-10-CM

## 2023-06-14 DIAGNOSIS — Z8249 Family history of ischemic heart disease and other diseases of the circulatory system: Secondary | ICD-10-CM

## 2023-06-14 DIAGNOSIS — Z91199 Patient's noncompliance with other medical treatment and regimen due to unspecified reason: Secondary | ICD-10-CM

## 2023-06-14 DIAGNOSIS — Z79899 Other long term (current) drug therapy: Secondary | ICD-10-CM | POA: Diagnosis not present

## 2023-06-14 DIAGNOSIS — E8729 Other acidosis: Secondary | ICD-10-CM | POA: Diagnosis present

## 2023-06-14 DIAGNOSIS — I890 Lymphedema, not elsewhere classified: Secondary | ICD-10-CM

## 2023-06-14 DIAGNOSIS — E039 Hypothyroidism, unspecified: Secondary | ICD-10-CM | POA: Diagnosis present

## 2023-06-14 DIAGNOSIS — E873 Alkalosis: Secondary | ICD-10-CM | POA: Diagnosis present

## 2023-06-14 DIAGNOSIS — Z880 Allergy status to penicillin: Secondary | ICD-10-CM

## 2023-06-14 DIAGNOSIS — G8929 Other chronic pain: Secondary | ICD-10-CM | POA: Diagnosis present

## 2023-06-14 DIAGNOSIS — Z751 Person awaiting admission to adequate facility elsewhere: Secondary | ICD-10-CM

## 2023-06-14 DIAGNOSIS — I5033 Acute on chronic diastolic (congestive) heart failure: Secondary | ICD-10-CM | POA: Diagnosis not present

## 2023-06-14 DIAGNOSIS — I5030 Unspecified diastolic (congestive) heart failure: Secondary | ICD-10-CM | POA: Diagnosis present

## 2023-06-14 DIAGNOSIS — I350 Nonrheumatic aortic (valve) stenosis: Secondary | ICD-10-CM | POA: Diagnosis present

## 2023-06-14 DIAGNOSIS — Z7401 Bed confinement status: Secondary | ICD-10-CM

## 2023-06-14 DIAGNOSIS — Z79890 Hormone replacement therapy (postmenopausal): Secondary | ICD-10-CM | POA: Diagnosis not present

## 2023-06-14 DIAGNOSIS — M79645 Pain in left finger(s): Secondary | ICD-10-CM | POA: Diagnosis present

## 2023-06-14 DIAGNOSIS — J9621 Acute and chronic respiratory failure with hypoxia: Secondary | ICD-10-CM | POA: Diagnosis present

## 2023-06-14 DIAGNOSIS — R0603 Acute respiratory distress: Secondary | ICD-10-CM | POA: Diagnosis present

## 2023-06-14 DIAGNOSIS — I2510 Atherosclerotic heart disease of native coronary artery without angina pectoris: Secondary | ICD-10-CM | POA: Diagnosis present

## 2023-06-14 DIAGNOSIS — Z7901 Long term (current) use of anticoagulants: Secondary | ICD-10-CM

## 2023-06-14 DIAGNOSIS — L304 Erythema intertrigo: Secondary | ICD-10-CM | POA: Diagnosis present

## 2023-06-14 DIAGNOSIS — Z90710 Acquired absence of both cervix and uterus: Secondary | ICD-10-CM

## 2023-06-14 DIAGNOSIS — Z7984 Long term (current) use of oral hypoglycemic drugs: Secondary | ICD-10-CM

## 2023-06-14 DIAGNOSIS — R5381 Other malaise: Secondary | ICD-10-CM | POA: Diagnosis present

## 2023-06-14 DIAGNOSIS — Z6841 Body Mass Index (BMI) 40.0 and over, adult: Secondary | ICD-10-CM

## 2023-06-14 DIAGNOSIS — Z7951 Long term (current) use of inhaled steroids: Secondary | ICD-10-CM

## 2023-06-14 DIAGNOSIS — J189 Pneumonia, unspecified organism: Principal | ICD-10-CM | POA: Diagnosis present

## 2023-06-14 DIAGNOSIS — R609 Edema, unspecified: Secondary | ICD-10-CM | POA: Diagnosis not present

## 2023-06-14 DIAGNOSIS — M19042 Primary osteoarthritis, left hand: Secondary | ICD-10-CM | POA: Diagnosis present

## 2023-06-14 DIAGNOSIS — E1169 Type 2 diabetes mellitus with other specified complication: Secondary | ICD-10-CM

## 2023-06-14 DIAGNOSIS — J9602 Acute respiratory failure with hypercapnia: Secondary | ICD-10-CM | POA: Diagnosis not present

## 2023-06-14 DIAGNOSIS — B372 Candidiasis of skin and nail: Secondary | ICD-10-CM | POA: Diagnosis present

## 2023-06-14 DIAGNOSIS — Z888 Allergy status to other drugs, medicaments and biological substances status: Secondary | ICD-10-CM

## 2023-06-14 DIAGNOSIS — Z87891 Personal history of nicotine dependence: Secondary | ICD-10-CM

## 2023-06-14 DIAGNOSIS — F419 Anxiety disorder, unspecified: Secondary | ICD-10-CM | POA: Diagnosis present

## 2023-06-14 HISTORY — DX: Pneumonia, unspecified organism: J18.9

## 2023-06-14 LAB — I-STAT VENOUS BLOOD GAS, ED
Acid-Base Excess: 12 mmol/L — ABNORMAL HIGH (ref 0.0–2.0)
Bicarbonate: 41.2 mmol/L — ABNORMAL HIGH (ref 20.0–28.0)
Calcium, Ion: 1.07 mmol/L — ABNORMAL LOW (ref 1.15–1.40)
HCT: 37 % (ref 36.0–46.0)
Hemoglobin: 12.6 g/dL (ref 12.0–15.0)
O2 Saturation: 99 %
Potassium: 3.4 mmol/L — ABNORMAL LOW (ref 3.5–5.1)
Sodium: 139 mmol/L (ref 135–145)
TCO2: 44 mmol/L — ABNORMAL HIGH (ref 22–32)
pCO2, Ven: 81.8 mmHg (ref 44–60)
pH, Ven: 7.31 (ref 7.25–7.43)
pO2, Ven: 137 mmHg — ABNORMAL HIGH (ref 32–45)

## 2023-06-14 LAB — RESPIRATORY PANEL BY PCR

## 2023-06-14 LAB — RESP PANEL BY RT-PCR (RSV, FLU A&B, COVID)  RVPGX2
Influenza A by PCR: NEGATIVE
Influenza B by PCR: NEGATIVE
Resp Syncytial Virus by PCR: NEGATIVE
SARS Coronavirus 2 by RT PCR: NEGATIVE

## 2023-06-14 LAB — I-STAT CHEM 8, ED
BUN: 23 mg/dL — ABNORMAL HIGH (ref 6–20)
Calcium, Ion: 1.07 mmol/L — ABNORMAL LOW (ref 1.15–1.40)
Chloride: 91 mmol/L — ABNORMAL LOW (ref 98–111)
Creatinine, Ser: 1.3 mg/dL — ABNORMAL HIGH (ref 0.44–1.00)
Glucose, Bld: 251 mg/dL — ABNORMAL HIGH (ref 70–99)
HCT: 38 % (ref 36.0–46.0)
Hemoglobin: 12.9 g/dL (ref 12.0–15.0)
Potassium: 3.5 mmol/L (ref 3.5–5.1)
Sodium: 139 mmol/L (ref 135–145)
TCO2: 37 mmol/L — ABNORMAL HIGH (ref 22–32)

## 2023-06-14 LAB — COMPREHENSIVE METABOLIC PANEL
ALT: 11 U/L (ref 0–44)
AST: 13 U/L — ABNORMAL LOW (ref 15–41)
Albumin: 3.1 g/dL — ABNORMAL LOW (ref 3.5–5.0)
Alkaline Phosphatase: 97 U/L (ref 38–126)
Anion gap: 15 (ref 5–15)
BUN: 19 mg/dL (ref 6–20)
CO2: 36 mmol/L — ABNORMAL HIGH (ref 22–32)
Calcium: 8.6 mg/dL — ABNORMAL LOW (ref 8.9–10.3)
Chloride: 89 mmol/L — ABNORMAL LOW (ref 98–111)
Creatinine, Ser: 1.13 mg/dL — ABNORMAL HIGH (ref 0.44–1.00)
GFR, Estimated: 56 mL/min — ABNORMAL LOW (ref >60)
Glucose, Bld: 248 mg/dL — ABNORMAL HIGH (ref 70–99)
Potassium: 3.5 mmol/L (ref 3.5–5.1)
Sodium: 140 mmol/L (ref 135–145)
Total Bilirubin: 0.8 mg/dL (ref 0.3–1.2)
Total Protein: 7.1 g/dL (ref 6.5–8.1)

## 2023-06-14 LAB — I-STAT CG4 LACTIC ACID, ED
Lactic Acid, Venous: 2.4 mmol/L (ref 0.5–1.9)
Lactic Acid, Venous: 1.5 mmol/L (ref 0.5–1.9)

## 2023-06-14 LAB — CBC WITH DIFFERENTIAL/PLATELET
Abs Immature Granulocytes: 0.03 10*3/uL (ref 0.00–0.07)
Basophils Absolute: 0.1 10*3/uL (ref 0.0–0.1)
Basophils Relative: 1 %
Eosinophils Absolute: 0.1 10*3/uL (ref 0.0–0.5)
Eosinophils Relative: 1 %
HCT: 37.8 % (ref 36.0–46.0)
Hemoglobin: 11.4 g/dL — ABNORMAL LOW (ref 12.0–15.0)
Immature Granulocytes: 0 %
Lymphocytes Relative: 24 %
Lymphs Abs: 2 10*3/uL (ref 0.7–4.0)
MCH: 30.5 pg (ref 26.0–34.0)
MCHC: 30.2 g/dL (ref 30.0–36.0)
MCV: 101.1 fL — ABNORMAL HIGH (ref 80.0–100.0)
Monocytes Absolute: 0.8 10*3/uL (ref 0.1–1.0)
Monocytes Relative: 9 %
Neutro Abs: 5.5 10*3/uL (ref 1.7–7.7)
Neutrophils Relative %: 65 %
Platelets: 224 10*3/uL (ref 150–400)
RBC: 3.74 MIL/uL — ABNORMAL LOW (ref 3.87–5.11)
RDW: 15.7 % — ABNORMAL HIGH (ref 11.5–15.5)
WBC: 8.6 10*3/uL (ref 4.0–10.5)
nRBC: 0 % (ref 0.0–0.2)

## 2023-06-14 LAB — CBG MONITORING, ED: Glucose-Capillary: 207 mg/dL — ABNORMAL HIGH (ref 70–99)

## 2023-06-14 LAB — TROPONIN I (HIGH SENSITIVITY)
Troponin I (High Sensitivity): 7 ng/L (ref <18)
Troponin I (High Sensitivity): 7 ng/L (ref <18)

## 2023-06-14 LAB — SARS CORONAVIRUS 2 BY RT PCR: SARS Coronavirus 2 by RT PCR: NEGATIVE

## 2023-06-14 LAB — BRAIN NATRIURETIC PEPTIDE: B Natriuretic Peptide: 158.2 pg/mL — ABNORMAL HIGH (ref 0.0–100.0)

## 2023-06-14 MED ORDER — APIXABAN 5 MG PO TABS
5.0000 mg | ORAL_TABLET | Freq: Two times a day (BID) | ORAL | Status: DC
  Administered 2023-06-14 – 2023-06-24 (×21): 5 mg via ORAL
  Filled 2023-06-14 (×22): qty 1

## 2023-06-14 MED ORDER — SODIUM CHLORIDE 0.9 % IV SOLN
2.0000 g | Freq: Once | INTRAVENOUS | Status: AC
  Administered 2023-06-14: 2 g via INTRAVENOUS
  Filled 2023-06-14: qty 12.5

## 2023-06-14 MED ORDER — SODIUM CHLORIDE 0.9 % IV SOLN: INTRAVENOUS | Status: DC

## 2023-06-14 MED ORDER — VANCOMYCIN HCL 2000 MG/400ML IV SOLN
2000.0000 mg | Freq: Once | INTRAVENOUS | Status: AC
  Administered 2023-06-14: 2000 mg via INTRAVENOUS
  Filled 2023-06-14: qty 400

## 2023-06-14 MED ORDER — ROPINIROLE HCL 0.25 MG PO TABS
0.2500 mg | ORAL_TABLET | Freq: Every day | ORAL | Status: DC
  Administered 2023-06-14 – 2023-06-15 (×3): 0.25 mg via ORAL
  Filled 2023-06-14 (×3): qty 1

## 2023-06-14 MED ORDER — LEVOTHYROXINE SODIUM 75 MCG PO TABS
150.0000 ug | ORAL_TABLET | Freq: Every day | ORAL | Status: DC
  Administered 2023-06-14 – 2023-06-24 (×11): 150 ug via ORAL
  Filled 2023-06-14 (×11): qty 2

## 2023-06-14 MED ORDER — HYDROXYZINE HCL 25 MG PO TABS
25.0000 mg | ORAL_TABLET | Freq: Three times a day (TID) | ORAL | Status: DC | PRN
  Administered 2023-06-14 – 2023-06-16 (×4): 25 mg via ORAL
  Filled 2023-06-14 (×4): qty 1

## 2023-06-14 MED ORDER — BUDESONIDE 0.5 MG/2ML IN SUSP
0.5000 mg | Freq: Two times a day (BID) | RESPIRATORY_TRACT | Status: DC
  Administered 2023-06-14 (×2): 0.5 mg via RESPIRATORY_TRACT
  Filled 2023-06-14 (×2): qty 2

## 2023-06-14 MED ORDER — FERROUS SULFATE 325 (65 FE) MG PO TABS
325.0000 mg | ORAL_TABLET | Freq: Every day | ORAL | Status: DC
  Administered 2023-06-14 – 2023-06-24 (×11): 325 mg via ORAL
  Filled 2023-06-14 (×11): qty 1

## 2023-06-14 MED ORDER — OXYCODONE HCL 5 MG PO TABS
5.0000 mg | ORAL_TABLET | Freq: Four times a day (QID) | ORAL | Status: DC
  Administered 2023-06-14 – 2023-06-24 (×41): 5 mg via ORAL
  Filled 2023-06-14 (×41): qty 1

## 2023-06-14 MED ORDER — SPIRONOLACTONE 25 MG PO TABS
25.0000 mg | ORAL_TABLET | Freq: Every day | ORAL | Status: DC
  Administered 2023-06-14 – 2023-06-24 (×11): 25 mg via ORAL
  Filled 2023-06-14 (×11): qty 1

## 2023-06-14 MED ORDER — ATORVASTATIN CALCIUM 10 MG PO TABS
20.0000 mg | ORAL_TABLET | Freq: Every evening | ORAL | Status: DC
  Administered 2023-06-14 – 2023-06-23 (×10): 20 mg via ORAL
  Filled 2023-06-14 (×10): qty 2

## 2023-06-14 MED ORDER — ADULT MULTIVITAMIN W/MINERALS CH
1.0000 | ORAL_TABLET | Freq: Every day | ORAL | Status: DC
  Administered 2023-06-14 – 2023-06-24 (×11): 1 via ORAL
  Filled 2023-06-14 (×11): qty 1

## 2023-06-14 MED ORDER — AMIODARONE HCL 200 MG PO TABS
200.0000 mg | ORAL_TABLET | Freq: Two times a day (BID) | ORAL | Status: AC
  Administered 2023-06-14 – 2023-06-21 (×16): 200 mg via ORAL
  Filled 2023-06-14 (×16): qty 1

## 2023-06-14 MED ORDER — ACETAMINOPHEN 650 MG RE SUPP
650.0000 mg | Freq: Once | RECTAL | Status: AC
  Administered 2023-06-14: 650 mg via RECTAL
  Filled 2023-06-14: qty 1

## 2023-06-14 MED ORDER — IPRATROPIUM-ALBUTEROL 0.5-2.5 (3) MG/3ML IN SOLN
3.0000 mL | Freq: Once | RESPIRATORY_TRACT | Status: AC
  Administered 2023-06-14: 3 mL via RESPIRATORY_TRACT
  Filled 2023-06-14: qty 3

## 2023-06-14 MED ORDER — OXYCODONE-ACETAMINOPHEN 10-325 MG PO TABS: 1.0000 | ORAL_TABLET | Freq: Four times a day (QID) | ORAL | Status: DC

## 2023-06-14 MED ORDER — INSULIN ASPART 100 UNIT/ML IJ SOLN
0.0000 [IU] | Freq: Three times a day (TID) | INTRAMUSCULAR | Status: DC
  Administered 2023-06-14 – 2023-06-15 (×2): 7 [IU] via SUBCUTANEOUS
  Administered 2023-06-15 – 2023-06-16 (×3): 4 [IU] via SUBCUTANEOUS
  Administered 2023-06-16: 7 [IU] via SUBCUTANEOUS
  Administered 2023-06-17: 4 [IU] via SUBCUTANEOUS
  Administered 2023-06-18: 7 [IU] via SUBCUTANEOUS
  Administered 2023-06-18: 4 [IU] via SUBCUTANEOUS
  Administered 2023-06-18: 7 [IU] via SUBCUTANEOUS
  Administered 2023-06-19: 11 [IU] via SUBCUTANEOUS
  Administered 2023-06-19: 7 [IU] via SUBCUTANEOUS
  Administered 2023-06-19: 4 [IU] via SUBCUTANEOUS
  Administered 2023-06-20: 11 [IU] via SUBCUTANEOUS
  Administered 2023-06-20 – 2023-06-21 (×3): 7 [IU] via SUBCUTANEOUS
  Administered 2023-06-21: 4 [IU] via SUBCUTANEOUS
  Administered 2023-06-21 – 2023-06-22 (×2): 7 [IU] via SUBCUTANEOUS
  Administered 2023-06-22 (×2): 11 [IU] via SUBCUTANEOUS
  Administered 2023-06-23: 4 [IU] via SUBCUTANEOUS
  Administered 2023-06-23 (×2): 7 [IU] via SUBCUTANEOUS
  Administered 2023-06-24: 11 [IU] via SUBCUTANEOUS
  Administered 2023-06-24: 7 [IU] via SUBCUTANEOUS

## 2023-06-14 MED ORDER — OXYCODONE-ACETAMINOPHEN 5-325 MG PO TABS
1.0000 | ORAL_TABLET | Freq: Four times a day (QID) | ORAL | Status: DC
  Administered 2023-06-14 – 2023-06-24 (×40): 1 via ORAL
  Filled 2023-06-14 (×40): qty 1

## 2023-06-14 MED ORDER — GABAPENTIN 400 MG PO CAPS
400.0000 mg | ORAL_CAPSULE | Freq: Three times a day (TID) | ORAL | Status: DC
  Administered 2023-06-14 – 2023-06-24 (×30): 400 mg via ORAL
  Filled 2023-06-14 (×30): qty 1

## 2023-06-14 MED ORDER — SODIUM CHLORIDE 0.9 % IV SOLN
2.0000 g | Freq: Three times a day (TID) | INTRAVENOUS | Status: AC
  Administered 2023-06-14 – 2023-06-20 (×20): 2 g via INTRAVENOUS
  Filled 2023-06-14 (×20): qty 12.5

## 2023-06-14 MED ORDER — TORSEMIDE 20 MG PO TABS
40.0000 mg | ORAL_TABLET | Freq: Two times a day (BID) | ORAL | Status: DC
  Administered 2023-06-14 – 2023-06-16 (×5): 40 mg via ORAL
  Filled 2023-06-14 (×5): qty 2

## 2023-06-14 MED ORDER — ALBUTEROL SULFATE (2.5 MG/3ML) 0.083% IN NEBU: 2.5000 mg | INHALATION_SOLUTION | Freq: Four times a day (QID) | RESPIRATORY_TRACT | Status: DC | PRN

## 2023-06-14 MED ORDER — BUPROPION HCL ER (SR) 100 MG PO TB12
100.0000 mg | ORAL_TABLET | Freq: Two times a day (BID) | ORAL | Status: DC
  Administered 2023-06-14 – 2023-06-24 (×21): 100 mg via ORAL
  Filled 2023-06-14 (×22): qty 1

## 2023-06-14 MED ORDER — EMPAGLIFLOZIN 10 MG PO TABS
10.0000 mg | ORAL_TABLET | Freq: Every day | ORAL | Status: DC
  Administered 2023-06-14 – 2023-06-24 (×11): 10 mg via ORAL
  Filled 2023-06-14 (×11): qty 1

## 2023-06-14 MED ORDER — VANCOMYCIN HCL 1500 MG/300ML IV SOLN
1500.0000 mg | INTRAVENOUS | Status: DC
  Administered 2023-06-15: 1500 mg via INTRAVENOUS
  Filled 2023-06-14: qty 300

## 2023-06-14 NOTE — Assessment & Plan Note (Signed)
Patient currently on BiPAP.

## 2023-06-14 NOTE — H&P (Addendum)
Hospital Admission History and Physical Service Pager: [phone]  Patient name: Chelsea [name] Medical record number: [id number] Date of Birth: 08-26-1966 Age: 57 y.o. Gender: female  Primary Care Provider: Pcp, No Consultants: None Code Status: Full Code  Preferred Emergency Contact:   Contact Information     Name Relation Home Work Mobile   Petrey Significant other   [phone]      Other Contacts     Name Relation Home Work Mobile   Ralston Sister   [phone]        Chief Complaint: Shortness of breath  Assessment and Plan: ALANTIS SEIFERT is a 57 y.o. female with a PMHx of HFpEF, aortic valve steonosis, paroxysmal afib/flutter, chronic respiratory failure on continuous O2 (5L home), OSH/OSA and COPD presenting with multifocal pneumonia confirmed on CT chest. Patient comes in from healthcare facility making this a likely HAP. Patient recently hospitalized for A. Flutter/ CHF exacerbation.  Assessment & Plan Multifocal pneumonia Patient complaining breathing difficulty w/ low pulse ox at health care facility, arrives febrile to 101 w/ CT scan that showed multifocal PNA. Will treat for HAP.  -Admit to FPTS, attending Dr. Miquel Dunn -Continue Vanc per pharmacy -Continue Cefepime per pharmacy -Continue BiPAP -NPO while on BiPAP, consider diet later -Vitals per unit -1/2 mIVF x 8 hr -F/u MRSA PCR swab AKI (acute kidney injury) (HCC) Cr on admission 1.30, increased from 0.92 6 days ago. Will start rehydration with gentle IV fluids given hx of HF. Plan to reassess tomorrow.  -1/2 mIVF x 8 hr -BMP tomorrow Acute respiratory failure with hypoxia and hypercarbia (HCC) Patient with hx of chronic hypoxic respiratory failure, on 5L O2 at home. Patient came in and placed on BiPAP for continued low sats. Will continue BiPAP. -Continue BiPAP -NPO while on BiPAP -Consider diet after off BiPAP -Target O2 sats 88-92 Type 2 diabetes mellitus without  complication, unspecified whether long term insulin use (HCC) Last A1c 8.5 05/29/23, home meds include jardiance, will start resistant sliding scale and jardiance when eating.  -Jadiance 10 mg dialy -Resistant sliding scale -Cbg checks 4 x daily qac&qhs   Chronic and Stable Problems:  HFpEF - BNP 158 on admission, does not appear volume overloaded. Continue home Spironolactone 25 mg daily and Torsemide 40 mg BID  Paroxsymal a/fib/flutter - continue amiodarone 200 mg BID then 200 mg daily starting 06/22/23 COPD - Continue Pulmicort nebulizer BID, goal O2 88-92%  Restless Leg Syndrome - Continue requip 0.25 mg qhs and gabapentin 300 mg TID Chronic IDA - Continue ferous sulfate 325 mg daily Hypothyroidism - Continue Synthroid 150 mcg daily HLD - Continue atorvostatin 20 mg dialy Anxiety - wellbutrin 100mg  bid, atarax 25mg  TID PRN    FEN/GI: NPO VTE Prophylaxis: Eliquis  Disposition: Likely back to long term care at [facility]   History of Present Illness:  Chelsea [name] is a 57 y.o. female presenting with Boyfriend reports she's been having a hard time breathing, starting yesterday. She also was not waking up/conversing and seemed off. Her O2 was tested and kept dropping down into the 70's/low 80's yesterday evening. They tried CPAP and she was not improving and they decided to send her over to be seen in the ED.  In the ED, patient was febrile to 101, CT chest showed multifocal PNA, VBG showed normal pH and elevated pCO2 and she was placed on BiPAP. Patient also received CT Head for concern of AMS. She was treated with tylenol for fever, and  started on Vancomycin and Cefepime for HAP.  Review Of Systems: Per HPI with the following additions:   Pertinent Past Medical History: Hypothyroidism  Acute on chronic CHF  COPD  Chronic hypoxic respiratory failure  Atrial Flutter Diabetes  Cervical Cancer s/p hysterectomy Restless legs IDA HLD  Remainder reviewed in  history tab.   Pertinent Past Surgical History: EGD 07/23/2022 Tracheostomy 04/09/2022 Total abdominal hysterectomy 2006   Remainder reviewed in history tab.  Pertinent Social History: Tobacco use: No Alcohol use: Not currently Other Substance use: None Lives at [facility], [employer]  Pertinent Family History: Father Heart Disease  Remainder reviewed in history tab.   Important Outpatient Medications: Amiodarone 200 mg BID Atorvastatin 20 mg Budesonide nebulizer 0.5 mg q12h Bupropion ER 100 mg BID Cholecalciferol 125 mcg (5,000 units) daily Eliquis 5 mg BID  Empaglifozin 10 mg Ferrous sulfate 324 mg daily Hydorxyzine 25 mg in am and at bedtime Levothyroxine 150 mcg daily  Percocet 10-325 q6h Torsemide 40 mg BID  Spironolactone 25 mg daily  Albuterol nebulizer q6h prn Gabapentin 400 mg TID Oxygen 5 L/min  Remainder reviewed in medication history.   Objective: BP 114/61   Pulse 79   Temp 100.1 F (37.8 C) (Rectal)   Resp (!) 24   SpO2 97%  Exam: General: Obese, ill appearing, in mild distress from RLS Cardiovascular: S1/S2, RRR, 3/6 systolic murmur Respiratory: Coarse breath/BiPAP sounds, good air movement Gastrointestinal: Soft, NTTP, non-distended MSK: No LE edema Neuro: At baseline, responding to verbal stimuli, but issue with volitional participation  Labs:  CBC BMET  Recent Labs  Lab 06/14/23 0235 06/14/23 0338  WBC 8.6  --   HGB 11.4* 12.9  12.6  HCT 37.8 38.0  37.0  PLT 224  --    Recent Labs  Lab 06/14/23 0235 06/14/23 0338  NA 140 139  139  K 3.5 3.5  3.4*  CL 89* 91*  CO2 36*  --   BUN 19 23*  CREATININE 1.13* 1.30*  GLUCOSE 248* 251*  CALCIUM 8.6*  --     Pertinent additional labs  Lactate: 2.4 > 1.5   EKG:  Sinus rhythm, rate 80 bpm, prolonged qt 518    Imaging Studies Performed:  CT Chest Wo Contrast  Result Date: 06/14/2023 CLINICAL DATA:  Mental status changes. Pneumonia. Respiratory distress. EXAM: CT CHEST  WITHOUT CONTRAST TECHNIQUE: Multidetector CT imaging of the chest was performed following the standard protocol without IV contrast. RADIATION DOSE REDUCTION: This exam was performed according to the departmental dose-optimization program which includes automated exposure control, adjustment of the mA and/or kV according to patient size and/or use of iterative reconstruction technique. COMPARISON:  07/29/2022 FINDINGS: Cardiovascular: The heart is enlarged. No substantial pericardial effusion. Mild atherosclerotic calcification is noted in the wall of the thoracic aorta. Enlargement of the pulmonary outflow tract/main pulmonary arteries suggests pulmonary arterial hypertension. Mediastinum/Nodes: Increased number of lymph nodes are seen in the mediastinum, some of which are mildly enlarged including index 13 mm precarinal node on 48/3. Mild lymphadenopathy suspected in the hilar regions although hila are poorly evaluated on noncontrast CT imaging. The esophagus has normal imaging features. There is no axillary lymphadenopathy. Lungs/Pleura: Low lung volumes. There is patchy consolidative airspace disease in the lower lobes bilaterally with patchy and nodular consolidative airspace opacity in both upper lobes and the right middle lobe. Trace left pleural effusion. Upper Abdomen: Visualized portion of the upper abdomen is unremarkable. Musculoskeletal: No worrisome lytic or sclerotic osseous abnormality. IMPRESSION: 1. Patchy  consolidative airspace disease in the lower lobes bilaterally with patchy and nodular consolidative airspace opacity in both upper lobes and the right middle lobe. Imaging features compatible with multifocal pneumonia. Given the nodular character of disease in some regions, close follow-up suggested and repeat CT chest in 3 months recommended to ensure resolution 2. Trace left pleural effusion. 3. Increased number of lymph nodes in the mediastinum, some of which are mildly enlarged. These are  likely reactive. Attention on follow-up recommended. 4. Enlargement of the pulmonary outflow tract/main pulmonary arteries suggests pulmonary arterial hypertension. 5.  Aortic Atherosclerosis (ICD10-I70.0). Electronically Signed   By: Kennith Center M.D.   On: 06/14/2023 05:02   CT Head Wo Contrast  Result Date: 06/14/2023 CLINICAL DATA:  Mental status change with unknown cause. EXAM: CT HEAD WITHOUT CONTRAST TECHNIQUE: Contiguous axial images were obtained from the base of the skull through the vertex without intravenous contrast. RADIATION DOSE REDUCTION: This exam was performed according to the departmental dose-optimization program which includes automated exposure control, adjustment of the mA and/or kV according to patient size and/or use of iterative reconstruction technique. COMPARISON:  12/08/2010 FINDINGS: Brain: No evidence of acute infarction, hemorrhage, hydrocephalus, extra-axial collection or mass lesion/mass effect. Vascular: No hyperdense vessel or unexpected calcification. Skull: Normal. Negative for fracture or focal lesion. Sinuses/Orbits: Mucosal thickening in the left sphenoid sinus. Bilateral mastoid opacification, partial. Negative nasopharynx. Symmetric proptosis likely from retro-orbital fat. IMPRESSION: 1. No acute finding. 2. Bilateral mastoid and sphenoid sinus opacification. Electronically Signed   By: Tiburcio Pea M.D.   On: 06/14/2023 04:35   DG Chest Port 1 View  Result Date: 06/14/2023 CLINICAL DATA:  Shortness of breath EXAM: PORTABLE CHEST 1 VIEW COMPARISON:  Chest x-ray 06/03/2023. FINDINGS: The heart is enlarged. There central pulmonary vascular congestion and central interstitial opacities there is no lung infiltrate. Costophrenic angles are clear. No pneumothorax or acute fracture. IMPRESSION: Cardiomegaly with central pulmonary vascular congestion and central interstitial opacities, findings suggestive of CHF. Electronically Signed   By: Darliss Cheney M.D.   On:  06/14/2023 02:52      Bess Kinds, MD 06/14/2023, 9:30 AM PGY-3, [facility]  FPTS Intern pager: [phone], text pages welcome Secure chat group [facility] Teaching Service

## 2023-06-14 NOTE — ED Triage Notes (Signed)
Patient comes in from [facility] for respiratory distress. Patient refuses to wear BiPAP at night and was found to be at 85% on room air. Patient was not complaining of anything. She is alert and oriented and following commands. She states she hurts "everywhere".

## 2023-06-14 NOTE — ED Notes (Signed)
Transported patient to and from CT with RT. No issues.

## 2023-06-14 NOTE — Discharge Planning (Signed)
Pt is from [facility] (long term care) per [place], [facility] Rep and is able to return at time of discharge.

## 2023-06-14 NOTE — Hospital Course (Addendum)
Chelsea [name] is a 57 y.o.female with a history of history of COPD on 5L at home, HFpEF, PAF who was admitted to the [facility] Teaching Service at [facility] for respiratory distress. Her hospital course is detailed below:  Multifocal pneumonia   PCP Follow-up Recommendations: F/u Chest CT in 2 months to assure resolution of nodular infiltrates Pt would benefit from restarting GLP-1a Pt would benefit from a new mood med regimen. F/u jardiance

## 2023-06-14 NOTE — Progress Notes (Signed)
Pt transported from ED to CT and back on 100% O2 and settings as per flowsheet. Pt tol transport and procedure well.

## 2023-06-14 NOTE — Progress Notes (Signed)
ED Pharmacy Antibiotic Sign Off An antibiotic consult was received from an ED provider for vancomycin per pharmacy dosing with concern for pneumonia. A chart review was completed to assess appropriateness.   The following one time order(s) were placed:  Vancomycin 2g x 1  Further antibiotic and/or antibiotic pharmacy consults should be ordered by the admitting provider if indicated.   Thank you for allowing pharmacy to be a part of this patient's care.   Marja Kays, [facility]  Clinical Pharmacist 06/14/23 3:12 AM

## 2023-06-14 NOTE — Progress Notes (Signed)
Pt set up as per settings below due to complaint of SOB after refusing to wear BiPAP @outside  facility in light of pain complaint "everywhere" Will reduce settings to last known as tolerated as pt unaware of home settings and unable to find in documentation. 14/8   06/14/23 0234  BiPAP/CPAP/SIPAP  $ Non-Invasive Ventilator  Non-Invasive Vent Initial  $ Face Mask Large  Yes  BiPAP/CPAP/SIPAP Pt Type Adult  BiPAP/CPAP/SIPAP SERVO  Mask Type Full face mask  Mask Size Large  Pressure Support  (8 above PEEP)  PEEP 10 cmH20  FiO2 (%) 60 %  Minute Ventilation 7.2  Leak 33  Peak Inspiratory Pressure (PIP) 18  Tidal Volume (Vt) 440  Press High Alarm 25 cmH2O  BiPAP/CPAP /SiPAP Vitals  Pulse Rate 97  Resp 18  SpO2 100 %
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
## 2023-06-14 NOTE — Assessment & Plan Note (Deleted)
Cr on admission 1.30, increased from 0.92 6 days ago. Will start hydration with gentle IV fluids and reassess tomorrow.  -1/2 mIVF -BMP tomorrow

## 2023-06-14 NOTE — Progress Notes (Addendum)
Pharmacy Antibiotic Note  Chelsea [name] is a 57 y.o. female admitted on 06/14/2023 with pneumonia.  Patient received cefepime 2g x1 and vancomycin 2g x1 in the ED. Pharmacy has been consulted for vancomycin and cefepime dosing.  No leukocytosis with 24hr Tmax of 101F. CT scan showing multifocal pneumonia. Treating for HAP.  Plan: Vancomycin 1500mg  IV Q24H (Scr 1.3, eAUC 511.2), Goal AUC 400-550 Obtain levels as indicated Cefepime 2g IV Q8H F/u MRSA nasal swab F/u cultures, renal function, clinical progression, & LOT  Temp (24hrs), Avg:100 F (37.8 C), Min:98.8 F (37.1 C), Max:101 F (38.3 C)  Recent Labs  Lab 06/08/23 0405 06/14/23 0235 06/14/23 0338 06/14/23 0339 06/14/23 0455  WBC  --  8.6  --   --   --   CREATININE 0.92 1.13* 1.30*  --   --   LATICACIDVEN  --   --   --  2.4* 1.5    Estimated Creatinine Clearance: 69.2 mL/min (A) (by C-G formula based on SCr of 1.3 mg/dL (H)).    Allergies  Allergen Reactions   Iodinated Contrast Media Anaphylaxis   Penicillins Anaphylaxis    Patient tolerates cefepime (08/2017)   Insulin Lispro Other (See Comments)    Adder per [facility] from SNF   Minoxidil Hives    Antimicrobials this admission: 9/13 Vancomycin >>  9/13 Cefepime >>   Microbiology results: 9/13 BCx: no growth < 12 hours MRSA PCR: to be collected  Thank you for allowing pharmacy to be a part of this patient's care.  Nicole Kindred, PharmD PGY1 Pharmacy Resident 06/14/2023 12:18 PM

## 2023-06-14 NOTE — Progress Notes (Signed)
Went to see pt in ED. She is on BiPAP with SPO2 in high 90's, BP stable around her baseline. She appears very sleepy but opens her eyes to questions and nods her head appropriately.  Family Medicine will admit.

## 2023-06-14 NOTE — Progress Notes (Signed)
FMTS Brief Progress Note  S: Patient lying in bed watching television with family member at bedside.  States she is doing well, only concern is her bag with her belongings including her cell phone were not brought with her from the ED to her room upstairs.  O: BP 105/74 (BP Location: Left Wrist)   Pulse 84   Temp 98.7 F (37.1 C) (Axillary)   Resp 20   Ht 5\' 5"  (1.651 m)   Wt (!) 154.2 kg   SpO2 92%   BMI 56.58 kg/m   General: Obese 57 year old patient lying in bed watching television, NAD Cardio: Well-perfused Lungs: Breathing comfortably on home O2 via [place]  A/P: Multifocal pneumonia No longer short of breath, continue abx and treatment per day team's H&P.  No changes.  I have contacted the ED secretary who states she will attempt to find Ms. Lyter's belongings  Erick Alley, DO 06/14/2023, 8:28 PM PGY-3, [facility] Night Resident  Please page [phone] with questions.

## 2023-06-14 NOTE — ED Notes (Signed)
ED TO INPATIENT HANDOFF REPORT  ED Nurse Name and Phone #:  Omero Kowal 27  S Name/Age/Gender Chelsea [name] 57 y.o. female Room/Bed: 044C/044C  Code Status   Code Status: Full Code  Home/SNF/Other Home Patient oriented to: situation Is this baseline? Yes   Triage Complete: Triage complete  Chief Complaint Multifocal pneumonia [J18.9] Respiratory distress [R06.03]  Triage Note Patient comes in from [facility] for respiratory distress. Patient refuses to wear BiPAP at night and was found to be at 85% on room air. Patient was not complaining of anything. She is alert and oriented and following commands. She states she hurts "everywhere".    Allergies Allergies  Allergen Reactions   Iodinated Contrast Media Anaphylaxis   Penicillins Anaphylaxis    Patient tolerates cefepime (08/2017)   Insulin Lispro Other (See Comments)    Adder per MAR from SNF   Minoxidil Hives    Level of Care/Admitting Diagnosis ED Disposition     ED Disposition  Admit   Condition  --   Comment  Hospital Area: [facility] [[id number]]  Level of Care: Progressive [102]  Admit to Progressive based on following criteria: RESPIRATORY PROBLEMS hypoxemic/hypercapnic respiratory failure that is responsive to NIPPV (BiPAP) or High Flow Nasal Cannula (6-80 lpm). Frequent assessment/intervention, no > Q2 hrs < Q4 hrs, to maintain oxygenation and pulmonary hygiene.  May admit patient to [facility] or [facility] if equivalent level of care is available:: Yes  Covid Evaluation: Asymptomatic - no recent exposure (last 10 days) testing not required  Diagnosis: Respiratory distress [241876]  Admitting Physician: Alfredo Martinez [[id number]]  Attending Physician: Billey Co [[id number]]  Certification:: I certify this patient will need inpatient services for at least 2 midnights          B Medical/Surgery History Past Medical History:  Diagnosis Date   Acquired  hypothyroidism 11/13/2010   Qualifier: Diagnosis of   By: Jens Som, MD, Lyn Hollingshead      Acute congestive heart failure (HCC) 03/18/2021   Acute idiopathic gout of left hand 02/16/2019   Acute kidney injury (HCC) 05/29/2023   Acute on chronic diastolic CHF (congestive heart failure) (HCC) 09/25/2017   Acute on chronic diastolic CHF (congestive heart failure), NYHA class 3 (HCC) 09/25/2017   Acute on chronic hypoxic respiratory failure (HCC) 07/04/2022   Acute on chronic respiratory failure with hypoxia (HCC) 09/17/2022   Acute on chronic respiratory failure with hypoxia and hypercapnia (HCC) 04/07/2022   Acute respiratory failure with hypoxia and hypercarbia (HCC) 05/27/2016   Arthritis    "qwhere" (12/05/2017)   Asthma    Atrial flutter (HCC) 03/09/2021   Bleeding of the respiratory tract 04/07/2022   Bronchitis 05/30/2011   Cervical cancer (HCC) 2006   CHF (congestive heart failure) (HCC)    CHF exacerbation (HCC) 07/30/2022   Chronic diastolic heart failure (HCC) 11/14/2010      11/2010 322 lbs      Chronic heart failure with preserved ejection fraction (HCC) 02/23/2018   Chronic lower back pain    Chronic respiratory failure with hypoxia (HCC) 07/02/2018   ABG 11/2017 7.36/69   Consistent with obesity hypoventilation syndrome   Complication of anesthesia    "I have a hard time waking up from under it" (12/05/2017)   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   COPD with acute exacerbation (HCC) 03/04/2015   Coronary artery disease    Cough productive of clear sputum 06/03/2023   Diarrhea 06/18/2019  Diastolic congestive heart failure (HCC)    Echo 02/21/11: EF 55-60%, moderate LVH, grade 2 diastolic dysfunction, mild to moderate aortic stenosis with a mean gradient 23 mm of mercury   DJD (degenerative joint disease) 07/03/2021   DM2 (diabetes mellitus, type 2) (HCC) 11/10/2010   Dyspnea 12/05/2017   Dyspnea on exertion 02/09/2021    Edema 03/22/2011   Essential (primary) hypertension 11/10/2010   Formatting of this note might be different from the original.  Overview:   Qualifier: Diagnosis of   By: Kem Parkinson      Last Assessment & Plan:   Watch blood pressure off of minoxidil and add medications as needed.   Essential hypertension 11/10/2010   Qualifier: Diagnosis of   By: Kem Parkinson       Gastro-esophageal reflux disease without esophagitis 11/10/2010   GERD 11/10/2010   Qualifier: Diagnosis of   By: Kem Parkinson       Hair loss 09/17/2019   Heart failure (HCC) 04/20/2021   Heart murmur    Hepatitis C    History of cervical cancer 03/22/2011   History of gout X 1   History of tobacco use 04/20/2021   Hx of atrial flutter 03/15/2021   Hyperglycemia due to type 2 diabetes mellitus (HCC) 04/20/2021   Hyperlipidemia    Hypertension    Hypertension associated with diabetes (HCC) 08/25/2018   Irritable bowel syndrome with constipation 04/20/2021   Irritable bowel syndrome with diarrhea 06/18/2019   Limitation of activity due to disability 09/18/2021   Lymphedema of both lower extremities 03/22/2011   Metabolic alkalosis 03/28/2021   Microalbuminuria 04/20/2021   Migraine    "monthly" (12/05/2017)   Mild tricuspid regurgitation 04/02/2021   Mitral valve sclerosis 04/02/2021   Mixed hyperlipidemia 04/18/2020   Moderate aortic stenosis 04/20/2021   Mononeuropathy of lower extremity 04/09/2011   Formatting of this note might be different from the original.  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  Severe, corrected by CPAP 18 with C-flex of 3  2/13 bipap 20/14 sm ff mask h/h biflex+3cm  Formatting of this note might be different from the original.  Sleep study 06/2020: IMPRESSIONS  - Moderate obstructive sleep apnea occurred during this study (AHI = 17.7/h), mostly REM related  - No significant central sleep apnea occurred during   On home oxygen therapy    "2L all the time" (12/05/2017)   On supplemental oxygen by nasal cannula 04/20/2021   Onychomycosis 06/18/2019   OSA treated with BiPAP    "have CPAP at home too; wearing BiPAP right now" (12/05/2017)   Osteoarthritis 11/28/2010   Qualifier: Diagnosis of   By: Huntley Dec, Scott       Paroxysmal atrial fibrillation (HCC) 09/04/2021   Physical debility 05/10/2018   Formatting of this note might be different from the original.  Due to morbid obesity   Physical deconditioning  04/24/2021   Pneumonia    "several times" (12/05/2017)   Pneumonia due to gram-positive bacteria 08/24/2017   Pressure injury of both heels, unstageable (HCC) 07/03/2021   Pulmonary edema, acute (HCC) 02/21/2022   Renal insufficiency 04/20/2021   Restless leg syndrome 03/22/2021   Restrictive lung disease secondary to obesity 01/28/2022   S/P hysterectomy 02/19/2021   Tinea pedis of both feet 11/12/2018   Upper GI bleed 07/18/2022   Venous insufficiency of both lower extremities 07/02/2021   Weakness 03/20/2021   Weight loss 03/15/2021   Past Surgical History:  Procedure Laterality Date   ABDOMINAL SURGERY  2006 X 2   "TAH incision was infected"   BIOPSY  07/21/2022   Procedure: BIOPSY;  Surgeon: Kathi Der, MD;  Location: [facility];  Service: Gastroenterology;;   CHOLECYSTECTOMY OPEN     ESOPHAGOGASTRODUODENOSCOPY N/A 07/21/2022   Procedure:  ESOPHAGOGASTRODUODENOSCOPY (EGD);  Surgeon: Kathi Der, MD;  Location: [facility];  Service: Gastroenterology;  Laterality: N/A;   TONSILLECTOMY     TOTAL ABDOMINAL HYSTERECTOMY  2006   TRACHEOSTOMY REVISION N/A 04/09/2022   Procedure: CONTROL OF BLEEDING,TRACHEOSTOMY SITE;  Surgeon: Christia Reading, MD;  Location: [facility] OR;  Service: ENT;  Laterality: N/A;     A IV Location/Drains/Wounds Patient Lines/Drains/Airways Status     Active Line/Drains/Airways     Name Placement date Placement time Site Days   Peripheral IV 06/14/23 20 G Posterior;Right Hand 06/14/23  0227  Hand  less than 1   Peripheral IV 06/14/23 20 G 1.16" Anterior;Right Forearm 06/14/23  0302  Forearm  less than 1   Pressure Injury 04/12/22 Heel Left Deep Tissue Pressure Injury - Purple or maroon localized area of discolored intact skin or blood-filled blister due to damage of underlying soft tissue from pressure and/or shear. Purple, circular wound to heel 04/12/22  2000  -- 428   Wound / Incision (Open or Dehisced) 04/07/22 Other (Comment) Sacrum Mid long and narrow open wound in the skin fold crack 04/07/22  2000  Sacrum  433            Intake/Output Last 24 hours  Intake/Output Summary (Last 24 hours) at 06/14/2023 1719 Last data filed at 06/14/2023 1323 Gross per 24 hour  Intake 98.01 ml  Output --  Net 98.01 ml    Labs/Imaging Results for orders placed or performed during the hospital encounter of 06/14/23 (from the past 48 hour(s))  Comprehensive metabolic panel     Status: Abnormal   Collection Time: 06/14/23  2:35 AM  Result Value Ref Range   Sodium 140 135 - 145 mmol/L   Potassium 3.5 3.5 - 5.1 mmol/L   Chloride 89 (L) 98 - 111 mmol/L   CO2 36 (H) 22 - 32 mmol/L   Glucose, Bld 248 (H) 70 - 99 mg/dL    Comment: Glucose reference range applies only to samples taken after fasting for at least 8 hours.   BUN 19 6 - 20 mg/dL   Creatinine, Ser 1.61 (H) 0.44 - 1.00 mg/dL   Calcium 8.6 (L) 8.9 - 10.3  mg/dL   Total Protein 7.1 6.5 - 8.1 g/dL   Albumin 3.1 (L) 3.5 - 5.0 g/dL   AST 13 (L) 15 - 41 U/L   ALT 11 0 - 44 U/L   Alkaline Phosphatase 97 38 - 126 U/L   Total Bilirubin 0.8 0.3 - 1.2 mg/dL   GFR, Estimated 56 (L) >60 mL/min    Comment: (NOTE) Calculated using the CKD-EPI Creatinine Equation (  2021)    Anion gap 15 5 - 15    Comment: Performed at [facility], [address] [place], [place] [address]  CBC with Differential     Status: Abnormal   Collection Time: 06/14/23  2:35 AM  Result Value Ref Range   WBC 8.6 4.0 - 10.5 K/uL   RBC 3.74 (L) 3.87 - 5.11 MIL/uL   Hemoglobin 11.4 (L) 12.0 - 15.0 g/dL   HCT 32.4 40.1 - 02.7 %   MCV 101.1 (H) 80.0 - 100.0 fL   MCH 30.5 26.0 - 34.0 pg   MCHC 30.2 30.0 - 36.0 g/dL   RDW 25.3 (H) 66.4 - 40.3 %   Platelets 224 150 - 400 K/uL   nRBC 0.0 0.0 - 0.2 %   Neutrophils Relative % 65 %   Neutro Abs 5.5 1.7 - 7.7 K/uL   Lymphocytes Relative 24 %   Lymphs Abs 2.0 0.7 - 4.0 K/uL   Monocytes Relative 9 %   Monocytes Absolute 0.8 0.1 - 1.0 K/uL   Eosinophils Relative 1 %   Eosinophils Absolute 0.1 0.0 - 0.5 K/uL   Basophils Relative 1 %   Basophils Absolute 0.1 0.0 - 0.1 K/uL   Immature Granulocytes 0 %   Abs Immature Granulocytes 0.03 0.00 - 0.07 K/uL    Comment: Performed at [facility], [address] [place], [place] [address]  Troponin I (High Sensitivity)     Status: None   Collection Time: 06/14/23  2:35 AM  Result Value Ref Range   Troponin I (High Sensitivity) 7 <18 ng/L    Comment: (NOTE) Elevated high sensitivity troponin I (hsTnI) values and significant  changes across serial measurements may suggest ACS but many other  chronic and acute conditions are known to elevate hsTnI results.  Refer to the "Links" section for chest pain algorithms and additional  guidance. Performed at [facility], [address], [place], [place] [address]   Brain natriuretic peptide     Status: Abnormal   Collection  Time: 06/14/23  2:35 AM  Result Value Ref Range   B Natriuretic Peptide 158.2 (H) 0.0 - 100.0 pg/mL    Comment: Performed at [facility], [address], [place], [place] [address]  SARS Coronavirus 2 by RT PCR (hospital order, performed in [facility] hospital lab) *cepheid single result test* Anterior Nasal Swab     Status: None   Collection Time: 06/14/23  2:47 AM   Specimen: Anterior Nasal Swab  Result Value Ref Range   SARS Coronavirus 2 by RT PCR NEGATIVE NEGATIVE    Comment: Performed at [facility], [address] [place], [place] [address]  Culture, blood (routine x 2)     Status: None (Preliminary result)   Collection Time: 06/14/23  2:47 AM   Specimen: BLOOD RIGHT ARM  Result Value Ref Range   Specimen Description BLOOD RIGHT ARM    Special Requests      BOTTLES DRAWN AEROBIC AND ANAEROBIC Blood Culture adequate volume   Culture      NO GROWTH < 12 HOURS Performed at [facility], [address], [place], [place] [address]    Report Status PENDING   Culture, blood (routine x 2)     Status: None (Preliminary result)   Collection Time: 06/14/23  3:05 AM   Specimen: BLOOD RIGHT HAND  Result Value Ref Range   Specimen Description BLOOD RIGHT HAND  Special Requests      BOTTLES DRAWN AEROBIC AND ANAEROBIC Blood Culture adequate volume   Culture      NO GROWTH < 12 HOURS Performed at [facility], [address] [place], [place] [address]    Report Status PENDING   I-Stat venous blood gas, ED     Status: Abnormal   Collection Time: 06/14/23  3:38 AM  Result Value Ref Range   pH, Ven 7.310 7.25 - 7.43   pCO2, Ven 81.8 (HH) 44 - 60 mmHg   pO2, Ven 137 (H) 32 - 45 mmHg   Bicarbonate 41.2 (H) 20.0 - 28.0 mmol/L   TCO2 44 (H) 22 - 32 mmol/L   O2 Saturation 99 %   Acid-Base Excess 12.0 (H) 0.0 - 2.0 mmol/L   Sodium 139 135 - 145 mmol/L   Potassium 3.4 (L) 3.5 - 5.1 mmol/L   Calcium, Ion 1.07 (L) 1.15 - 1.40 mmol/L   HCT 37.0 36.0 - 46.0 %    Hemoglobin 12.6 12.0 - 15.0 g/dL   Sample type VENOUS    Comment NOTIFIED PHYSICIAN   I-stat chem 8, ED     Status: Abnormal   Collection Time: 06/14/23  3:38 AM  Result Value Ref Range   Sodium 139 135 - 145 mmol/L   Potassium 3.5 3.5 - 5.1 mmol/L   Chloride 91 (L) 98 - 111 mmol/L   BUN 23 (H) 6 - 20 mg/dL   Creatinine, Ser 6.04 (H) 0.44 - 1.00 mg/dL   Glucose, Bld 540 (H) 70 - 99 mg/dL    Comment: Glucose reference range applies only to samples taken after fasting for at least 8 hours.   Calcium, Ion 1.07 (L) 1.15 - 1.40 mmol/L   TCO2 37 (H) 22 - 32 mmol/L   Hemoglobin 12.9 12.0 - 15.0 g/dL   HCT 98.1 19.1 - 47.8 %  I-Stat Lactic Acid     Status: Abnormal   Collection Time: 06/14/23  3:39 AM  Result Value Ref Range   Lactic Acid, Venous 2.4 (HH) 0.5 - 1.9 mmol/L   Comment NOTIFIED PHYSICIAN   Resp panel by RT-PCR (RSV, Flu A&B, Covid) Anterior Nasal Swab     Status: None   Collection Time: 06/14/23  3:53 AM   Specimen: Anterior Nasal Swab  Result Value Ref Range   SARS Coronavirus 2 by RT PCR NEGATIVE NEGATIVE   Influenza A by PCR NEGATIVE NEGATIVE   Influenza B by PCR NEGATIVE NEGATIVE    Comment: (NOTE) The Xpert Xpress SARS-CoV-2/FLU/RSV plus assay is intended as an aid in the diagnosis of influenza from Nasopharyngeal swab specimens and should not be used as a sole basis for treatment. Nasal washings and aspirates are unacceptable for Xpert Xpress SARS-CoV-2/FLU/RSV testing.  Fact Sheet for Patients: BloggerCourse.com  Fact Sheet for Healthcare Providers: SeriousBroker.it  This test is not yet approved or cleared by the [place] FDA and has been authorized for detection and/or diagnosis of SARS-CoV-2 by FDA under an Emergency Use Authorization (EUA). This EUA will remain in effect (meaning this test can be used) for the duration of the COVID-19 declaration under Section 564(b)(1) of the Act, 21 U.S.C. section  360bbb-3(b)(1), unless the authorization is terminated or revoked.     Resp Syncytial Virus by PCR NEGATIVE NEGATIVE    Comment: (NOTE) Fact Sheet for Patients: BloggerCourse.com  Fact Sheet for Healthcare Providers: SeriousBroker.it  This test is not yet approved or cleared by the [place] FDA and has  been authorized for detection and/or diagnosis of SARS-CoV-2 by FDA under an Emergency Use Authorization (EUA). This EUA will remain in effect (meaning this test can be used) for the duration of the COVID-19 declaration under Section 564(b)(1) of the Act, 21 U.S.C. section 360bbb-3(b)(1), unless the authorization is terminated or revoked.  Performed at [facility], [address], [place], [place] [address]   Respiratory (~20 pathogens) panel by PCR     Status: None   Collection Time: 06/14/23  3:53 AM   Specimen: Nasopharyngeal Swab; Respiratory  Result Value Ref Range   Adenovirus NOT DETECTED NOT DETECTED   Coronavirus 229E NOT DETECTED NOT DETECTED    Comment: (NOTE) The Coronavirus on the Respiratory Panel, DOES NOT test for the novel  Coronavirus (2019 nCoV)    Coronavirus HKU1 NOT DETECTED NOT DETECTED   Coronavirus NL63 NOT DETECTED NOT DETECTED   Coronavirus OC43 NOT DETECTED NOT DETECTED   Metapneumovirus NOT DETECTED NOT DETECTED   Rhinovirus / Enterovirus NOT DETECTED NOT DETECTED   Influenza A NOT DETECTED NOT DETECTED   Influenza B NOT DETECTED NOT DETECTED   Parainfluenza Virus 1 NOT DETECTED NOT DETECTED   Parainfluenza Virus 2 NOT DETECTED NOT DETECTED   Parainfluenza Virus 3 NOT DETECTED NOT DETECTED   Parainfluenza Virus 4 NOT DETECTED NOT DETECTED   Respiratory Syncytial Virus NOT DETECTED NOT DETECTED   Bordetella pertussis NOT DETECTED NOT DETECTED   Bordetella Parapertussis NOT DETECTED NOT DETECTED   Chlamydophila pneumoniae NOT DETECTED NOT DETECTED   Mycoplasma pneumoniae NOT DETECTED  NOT DETECTED    Comment: Performed at [facility], [address] [place], [place] [address]  Troponin I (High Sensitivity)     Status: None   Collection Time: 06/14/23  4:44 AM  Result Value Ref Range   Troponin I (High Sensitivity) 7 <18 ng/L    Comment: (NOTE) Elevated high sensitivity troponin I (hsTnI) values and significant  changes across serial measurements may suggest ACS but many other  chronic and acute conditions are known to elevate hsTnI results.  Refer to the "Links" section for chest pain algorithms and additional  guidance. Performed at [facility], [address], [place], [place] [address]   I-Stat Lactic Acid     Status: None   Collection Time: 06/14/23  4:55 AM  Result Value Ref Range   Lactic Acid, Venous 1.5 0.5 - 1.9 mmol/L  CBG monitoring, ED     Status: Abnormal   Collection Time: 06/14/23  1:00 PM  Result Value Ref Range   Glucose-Capillary 207 (H) 70 - 99 mg/dL    Comment: Glucose reference range applies only to samples taken after fasting for at least 8 hours.   Comment 1 Notify RN    Comment 2 Document in Chart    CT Chest Wo Contrast  Result Date: 06/14/2023 CLINICAL DATA:  Mental status changes. Pneumonia. Respiratory distress. EXAM: CT CHEST WITHOUT CONTRAST TECHNIQUE: Multidetector CT imaging of the chest was performed following the standard protocol without IV contrast. RADIATION DOSE REDUCTION: This exam was performed according to the departmental dose-optimization program which includes automated exposure control, adjustment of the mA and/or kV according to patient size and/or use of iterative reconstruction technique. COMPARISON:  07/29/2022 FINDINGS: Cardiovascular: The heart is enlarged. No substantial pericardial effusion. Mild atherosclerotic calcification is noted in the wall of the thoracic aorta. Enlargement of the pulmonary outflow tract/main pulmonary arteries suggests pulmonary arterial hypertension. Mediastinum/Nodes:  Increased number of lymph  nodes are seen in the mediastinum, some of which are mildly enlarged including index 13 mm precarinal node on 48/3. Mild lymphadenopathy suspected in the hilar regions although hila are poorly evaluated on noncontrast CT imaging. The esophagus has normal imaging features. There is no axillary lymphadenopathy. Lungs/Pleura: Low lung volumes. There is patchy consolidative airspace disease in the lower lobes bilaterally with patchy and nodular consolidative airspace opacity in both upper lobes and the right middle lobe. Trace left pleural effusion. Upper Abdomen: Visualized portion of the upper abdomen is unremarkable. Musculoskeletal: No worrisome lytic or sclerotic osseous abnormality. IMPRESSION: 1. Patchy consolidative airspace disease in the lower lobes bilaterally with patchy and nodular consolidative airspace opacity in both upper lobes and the right middle lobe. Imaging features compatible with multifocal pneumonia. Given the nodular character of disease in some regions, close follow-up suggested and repeat CT chest in 3 months recommended to ensure resolution 2. Trace left pleural effusion. 3. Increased number of lymph nodes in the mediastinum, some of which are mildly enlarged. These are likely reactive. Attention on follow-up recommended. 4. Enlargement of the pulmonary outflow tract/main pulmonary arteries suggests pulmonary arterial hypertension. 5.  Aortic Atherosclerosis (ICD10-I70.0). Electronically Signed   By: Kennith Center M.D.   On: 06/14/2023 05:02   CT Head Wo Contrast  Result Date: 06/14/2023 CLINICAL DATA:  Mental status change with unknown cause. EXAM: CT HEAD WITHOUT CONTRAST TECHNIQUE: Contiguous axial images were obtained from the base of the skull through the vertex without intravenous contrast. RADIATION DOSE REDUCTION: This exam was performed according to the departmental dose-optimization program which includes automated exposure control, adjustment of the  mA and/or kV according to patient size and/or use of iterative reconstruction technique. COMPARISON:  12/08/2010 FINDINGS: Brain: No evidence of acute infarction, hemorrhage, hydrocephalus, extra-axial collection or mass lesion/mass effect. Vascular: No hyperdense vessel or unexpected calcification. Skull: Normal. Negative for fracture or focal lesion. Sinuses/Orbits: Mucosal thickening in the left sphenoid sinus. Bilateral mastoid opacification, partial. Negative nasopharynx. Symmetric proptosis likely from retro-orbital fat. IMPRESSION: 1. No acute finding. 2. Bilateral mastoid and sphenoid sinus opacification. Electronically Signed   By: Tiburcio Pea M.D.   On: 06/14/2023 04:35   DG Chest Port 1 View  Result Date: 06/14/2023 CLINICAL DATA:  Shortness of breath EXAM: PORTABLE CHEST 1 VIEW COMPARISON:  Chest x-ray 06/03/2023. FINDINGS: The heart is enlarged. There central pulmonary vascular congestion and central interstitial opacities there is no lung infiltrate. Costophrenic angles are clear. No pneumothorax or acute fracture. IMPRESSION: Cardiomegaly with central pulmonary vascular congestion and central interstitial opacities, findings suggestive of CHF. Electronically Signed   By: Darliss Cheney M.D.   On: 06/14/2023 02:52    Pending Labs Unresulted Labs (From admission, onward)     Start     Ordered   06/15/23 0500  Basic metabolic panel  Tomorrow morning,   R        06/14/23 0912   06/15/23 0500  CBC  Tomorrow morning,   R        06/14/23 0912   06/14/23 1017  MRSA Next Gen by PCR, Nasal  Once,   R        06/14/23 1016            Vitals/Pain Today's Vitals   06/14/23 1415 06/14/23 1438 06/14/23 1441 06/14/23 1530  BP:   (!) 106/57   Pulse:   82   Resp:   18   Temp:   98.5 F (36.9 C)   TempSrc:  Oral   SpO2:   96%   Weight:  (!) 340 lb (154.2 kg)    Height:  5\' 5"  (1.651 m)    PainSc: 6   0-No pain Asleep    Isolation Precautions No active  isolations  Medications Medications  amiodarone (PACERONE) tablet 200 mg (200 mg Oral Given 06/14/23 1257)  atorvastatin (LIPITOR) tablet 20 mg (has no administration in time range)  spironolactone (ALDACTONE) tablet 25 mg (25 mg Oral Given 06/14/23 1241)  torsemide (DEMADEX) tablet 40 mg (40 mg Oral Given 06/14/23 1257)  buPROPion ER (WELLBUTRIN SR) 12 hr tablet 100 mg (100 mg Oral Not Given 06/14/23 1521)  hydrOXYzine (ATARAX) tablet 25 mg (has no administration in time range)  empagliflozin (JARDIANCE) tablet 10 mg (10 mg Oral Given 06/14/23 1241)  levothyroxine (SYNTHROID) tablet 150 mcg (150 mcg Oral Given 06/14/23 1240)  apixaban (ELIQUIS) tablet 5 mg (5 mg Oral Given 06/14/23 1241)  ferrous sulfate tablet 325 mg (325 mg Oral Given 06/14/23 1240)  gabapentin (NEURONTIN) capsule 400 mg (400 mg Oral Given 06/14/23 1241)  multivitamin with minerals tablet 1 tablet (1 tablet Oral Given 06/14/23 1241)  budesonide (PULMICORT) nebulizer solution 0.5 mg (0.5 mg Inhalation Given 06/14/23 1111)  albuterol (PROVENTIL) (2.5 MG/3ML) 0.083% nebulizer solution 2.5 mg (has no administration in time range)  insulin aspart (novoLOG) injection 0-20 Units (7 Units Subcutaneous Given 06/14/23 1326)  rOPINIRole (REQUIP) tablet 0.25 mg (has no administration in time range)  oxyCODONE-acetaminophen (PERCOCET/ROXICET) 5-325 MG per tablet 1 tablet (1 tablet Oral Given 06/14/23 1257)    And  oxyCODONE (Oxy IR/ROXICODONE) immediate release tablet 5 mg (5 mg Oral Given 06/14/23 1240)  ceFEPIme (MAXIPIME) 2 g in sodium chloride 0.9 % 100 mL IVPB (0 g Intravenous Stopped 06/14/23 1323)  vancomycin (VANCOREADY) IVPB 1500 mg/300 mL (has no administration in time range)  ipratropium-albuterol (DUONEB) 0.5-2.5 (3) MG/3ML nebulizer solution 3 mL (3 mLs Nebulization Given 06/14/23 0250)  ceFEPIme (MAXIPIME) 2 g in sodium chloride 0.9 % 100 mL IVPB (0 g Intravenous Stopped 06/14/23 0352)  vancomycin (VANCOREADY) IVPB 2000 mg/400 mL (0 mg  Intravenous Stopped 06/14/23 0553)  acetaminophen (TYLENOL) suppository 650 mg (650 mg Rectal Given 06/14/23 0444)    Mobility non-ambulatory     Focused Assessments Pulmonary Assessment Handoff:  Lung sounds: Bilateral Breath Sounds: Diminished L Breath Sounds: Diminished R Breath Sounds: Diminished O2 Device: Room Air O2 Flow Rate (L/min): 5 L/min    R Recommendations: See Admitting Provider Note  Report given to:   Additional Notes:

## 2023-06-14 NOTE — Assessment & Plan Note (Addendum)
Patient with hx of chronic hypoxic respiratory failure, on 5L O2 at home. Patient came in and placed on BiPAP for continued low sats. Will continue BiPAP. -Continue BiPAP -NPO while on BiPAP -Consider diet after off BiPAP -Target O2 sats 88-92

## 2023-06-14 NOTE — Assessment & Plan Note (Addendum)
Cr on admission 1.30, increased from 0.92 6 days ago. Will start rehydration with gentle IV fluids given hx of HF. Plan to reassess tomorrow.  -1/2 mIVF x 8 hr -BMP tomorrow

## 2023-06-14 NOTE — Assessment & Plan Note (Addendum)
Patient complaining breathing difficulty w/ low pulse ox at health care facility, arrives febrile to 101 w/ CT scan that showed multifocal PNA. Will treat for HAP.  -Admit to [facility], attending Dr. Miquel Dunn -Continue Vanc per pharmacy -Continue Cefepime per pharmacy -Continue BiPAP -NPO while on BiPAP, consider diet later -Vitals per unit -1/2 mIVF x 8 hr -F/u MRSA PCR swab

## 2023-06-14 NOTE — ED Notes (Signed)
ED TO INPATIENT HANDOFF REPORT  ED Nurse Name and Phone #: Lenell Antu Name/Age/Gender Chelsea [name] 57 y.o. female Room/Bed: 015C/015C  Code Status   Code Status: Prior  Home/SNF/Other Skilled nursing facility Patient oriented to: self, place, time, and situation Is this baseline? Yes   Triage Complete: Triage complete  Chief Complaint cpap  Triage Note Patient comes in from [facility] for respiratory distress. Patient refuses to wear BiPAP at night and was found to be at 85% on room air. Patient was not complaining of anything. She is alert and oriented and following commands. She states she hurts "everywhere".    Allergies Allergies  Allergen Reactions   Iodinated Contrast Media Anaphylaxis   Penicillins Anaphylaxis    Patient tolerates cefepime (08/2017)   Insulin Lispro Other (See Comments)    Adder per MAR from SNF   Minoxidil Hives    Level of Care/Admitting Diagnosis ED Disposition     ED Disposition  Admit   Condition  --   Comment  The patient appears reasonably stabilized for admission considering the current resources, flow, and capabilities available in the ED at this time, and I doubt any other [facility] requiring further screening and/or treatment in the ED prior to admission is  present.          B Medical/Surgery History Past Medical History:  Diagnosis Date   Acquired hypothyroidism 11/13/2010   Qualifier: Diagnosis of   By: Jens Som, MD, Lyn Hollingshead      Acute congestive heart failure (HCC) 03/18/2021   Acute idiopathic gout of left hand 02/16/2019   Acute kidney injury (HCC) 05/29/2023   Acute on chronic diastolic CHF (congestive heart failure) (HCC) 09/25/2017   Acute on chronic diastolic CHF (congestive heart failure), NYHA class 3 (HCC) 09/25/2017   Acute on chronic hypoxic respiratory failure (HCC) 07/04/2022   Acute on chronic respiratory failure with hypoxia (HCC) 09/17/2022   Acute on chronic respiratory  failure with hypoxia and hypercapnia (HCC) 04/07/2022   Acute respiratory failure with hypoxia and hypercarbia (HCC) 05/27/2016   Arthritis    "qwhere" (12/05/2017)   Asthma    Atrial flutter (HCC) 03/09/2021   Bleeding of the respiratory tract 04/07/2022   Bronchitis 05/30/2011   Cervical cancer (HCC) 2006   CHF (congestive heart failure) (HCC)    CHF exacerbation (HCC) 07/30/2022   Chronic diastolic heart failure (HCC) 11/14/2010      11/2010 322 lbs      Chronic heart failure with preserved ejection fraction (HCC) 02/23/2018   Chronic lower back pain    Chronic respiratory failure with hypoxia (HCC) 07/02/2018   ABG 11/2017 7.36/69   Consistent with obesity hypoventilation syndrome   Complication of anesthesia    "I have a hard time waking up from under it" (12/05/2017)   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   COPD with acute exacerbation (HCC) 03/04/2015   Coronary artery disease    Cough productive of clear sputum 06/03/2023   Diarrhea 06/18/2019   Diastolic congestive heart failure (HCC)    Echo 02/21/11: EF 55-60%, moderate LVH, grade 2 diastolic dysfunction, mild to moderate aortic stenosis with a mean gradient 23 mm of mercury   DJD (degenerative joint disease) 07/03/2021   DM2 (diabetes mellitus, type 2) (HCC) 11/10/2010   Dyspnea 12/05/2017   Dyspnea on exertion 02/09/2021   Edema 03/22/2011   Essential (primary) hypertension 11/10/2010   Formatting of this note might be different from the original.  Overview:   Qualifier: Diagnosis of   By: Kem Parkinson      Last Assessment & Plan:   Watch blood pressure off of minoxidil and add medications as needed.   Essential hypertension 11/10/2010   Qualifier: Diagnosis of   By: Kem Parkinson       Gastro-esophageal reflux disease without esophagitis 11/10/2010   GERD 11/10/2010   Qualifier: Diagnosis of   By: Kem Parkinson       Hair loss 09/17/2019   Heart failure (HCC)  04/20/2021   Heart murmur    Hepatitis C    History of cervical cancer 03/22/2011   History of gout X 1   History of tobacco use 04/20/2021   Hx of atrial flutter 03/15/2021   Hyperglycemia due to type 2 diabetes mellitus (HCC) 04/20/2021   Hyperlipidemia    Hypertension    Hypertension associated with diabetes (HCC) 08/25/2018   Irritable bowel syndrome with constipation 04/20/2021   Irritable bowel syndrome with diarrhea 06/18/2019   Limitation of activity due to disability 09/18/2021   Lymphedema of both lower extremities 03/22/2011   Metabolic alkalosis 03/28/2021   Microalbuminuria 04/20/2021   Migraine    "monthly" (12/05/2017)   Mild tricuspid regurgitation 04/02/2021   Mitral valve sclerosis 04/02/2021   Mixed hyperlipidemia 04/18/2020   Moderate aortic stenosis 04/20/2021   Mononeuropathy of lower extremity 04/09/2011   Formatting of this note might be different from the original.  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  Severe, corrected by CPAP 18 with C-flex of 3  2/13 bipap 20/14 sm ff mask h/h biflex+3cm  Formatting of this note might be different from the original.  Sleep study 06/2020: IMPRESSIONS  - Moderate obstructive sleep apnea occurred during this study (AHI = 17.7/h), mostly REM related  - No significant central sleep apnea occurred during   On home oxygen therapy    "2L all  the time" (12/05/2017)   On supplemental oxygen by nasal cannula 04/20/2021   Onychomycosis 06/18/2019   OSA treated with BiPAP    "have CPAP at home too; wearing BiPAP right now" (12/05/2017)   Osteoarthritis 11/28/2010   Qualifier: Diagnosis of   By: Huntley Dec, Scott       Paroxysmal atrial fibrillation (HCC) 09/04/2021   Physical debility 05/10/2018   Formatting of this note might be different from the original.  Due to morbid obesity   Physical deconditioning 04/24/2021   Pneumonia    "several times" (12/05/2017)   Pneumonia due to gram-positive bacteria 08/24/2017   Pressure injury of both heels, unstageable (HCC) 07/03/2021   Pulmonary edema, acute (HCC) 02/21/2022   Renal insufficiency 04/20/2021   Restless leg syndrome 03/22/2021   Restrictive lung disease secondary to obesity 01/28/2022   S/P hysterectomy 02/19/2021   Tinea pedis of both feet 11/12/2018   Upper GI bleed 07/18/2022   Venous insufficiency of both  lower extremities 07/02/2021   Weakness 03/20/2021   Weight loss 03/15/2021   Past Surgical History:  Procedure Laterality Date   ABDOMINAL SURGERY  2006 X 2   "TAH incision was infected"   BIOPSY  07/21/2022   Procedure: BIOPSY;  Surgeon: Kathi Der, MD;  Location: [facility];  Service: Gastroenterology;;   CHOLECYSTECTOMY OPEN     ESOPHAGOGASTRODUODENOSCOPY N/A 07/21/2022   Procedure: ESOPHAGOGASTRODUODENOSCOPY (EGD);  Surgeon: Kathi Der, MD;  Location: [facility];  Service: Gastroenterology;  Laterality: N/A;   TONSILLECTOMY     TOTAL ABDOMINAL HYSTERECTOMY  2006   TRACHEOSTOMY REVISION N/A 04/09/2022   Procedure: CONTROL OF BLEEDING,TRACHEOSTOMY SITE;  Surgeon: Christia Reading, MD;  Location: [facility] OR;  Service: ENT;  Laterality: N/A;     A IV Location/Drains/Wounds Patient Lines/Drains/Airways Status     Active Line/Drains/Airways     Name Placement date Placement time Site Days   Peripheral IV 06/14/23 20 G Posterior;Right Hand 06/14/23   0227  Hand  less than 1   Peripheral IV 06/14/23 20 G 1.16" Anterior;Right Forearm 06/14/23  0302  Forearm  less than 1   Pressure Injury 04/12/22 Heel Left Deep Tissue Pressure Injury - Purple or maroon localized area of discolored intact skin or blood-filled blister due to damage of underlying soft tissue from pressure and/or shear. Purple, circular wound to heel 04/12/22  2000  -- 428   Wound / Incision (Open or Dehisced) 04/07/22 Other (Comment) Sacrum Mid long and narrow open wound in the skin fold crack 04/07/22  2000  Sacrum  433            Intake/Output Last 24 hours No intake or output data in the 24 hours ending 06/14/23 0825  Labs/Imaging Results for orders placed or performed during the hospital encounter of 06/14/23 (from the past 48 hour(s))  Comprehensive metabolic panel     Status: Abnormal   Collection Time: 06/14/23  2:35 AM  Result Value Ref Range   Sodium 140 135 - 145 mmol/L   Potassium 3.5 3.5 - 5.1 mmol/L   Chloride 89 (L) 98 - 111 mmol/L   CO2 36 (H) 22 - 32 mmol/L   Glucose, Bld 248 (H) 70 - 99 mg/dL    Comment: Glucose reference range applies only to samples taken after fasting for at least 8 hours.   BUN 19 6 - 20 mg/dL   Creatinine, Ser 1.61 (H) 0.44 - 1.00 mg/dL   Calcium 8.6 (L) 8.9 - 10.3 mg/dL   Total Protein 7.1 6.5 - 8.1 g/dL   Albumin 3.1 (L) 3.5 - 5.0 g/dL   AST 13 (L) 15 - 41 U/L   ALT 11 0 - 44 U/L   Alkaline Phosphatase 97 38 - 126 U/L   Total Bilirubin 0.8 0.3 - 1.2 mg/dL   GFR, Estimated 56 (L) >60 mL/min    Comment: (NOTE) Calculated using the CKD-EPI Creatinine Equation (2021)    Anion gap 15 5 - 15    Comment: Performed at [facility], [address], [place], [place] [address]  CBC with Differential     Status: Abnormal   Collection Time: 06/14/23  2:35 AM  Result Value Ref Range   WBC 8.6 4.0 - 10.5 K/uL   RBC 3.74 (L) 3.87 - 5.11 MIL/uL   Hemoglobin 11.4 (L) 12.0 - 15.0 g/dL   HCT 54.0 98.1 - 19.1 %   MCV 101.1 (H)  80.0 - 100.0 fL   MCH 30.5 26.0 - 34.0  pg   MCHC 30.2 30.0 - 36.0 g/dL   RDW 86.5 (H) 78.4 - 69.6 %   Platelets 224 150 - 400 K/uL   nRBC 0.0 0.0 - 0.2 %   Neutrophils Relative % 65 %   Neutro Abs 5.5 1.7 - 7.7 K/uL   Lymphocytes Relative 24 %   Lymphs Abs 2.0 0.7 - 4.0 K/uL   Monocytes Relative 9 %   Monocytes Absolute 0.8 0.1 - 1.0 K/uL   Eosinophils Relative 1 %   Eosinophils Absolute 0.1 0.0 - 0.5 K/uL   Basophils Relative 1 %   Basophils Absolute 0.1 0.0 - 0.1 K/uL   Immature Granulocytes 0 %   Abs Immature Granulocytes 0.03 0.00 - 0.07 K/uL    Comment: Performed at [facility], [address], [place], [place] [address]  Troponin I (High Sensitivity)     Status: None   Collection Time: 06/14/23  2:35 AM  Result Value Ref Range   Troponin I (High Sensitivity) 7 <18 ng/L    Comment: (NOTE) Elevated high sensitivity troponin I (hsTnI) values and significant  changes across serial measurements may suggest ACS but many other  chronic and acute conditions are known to elevate hsTnI results.  Refer to the "Links" section for chest pain algorithms and additional  guidance. Performed at [facility], [address] [place], [place] [address]   Brain natriuretic peptide     Status: Abnormal   Collection Time: 06/14/23  2:35 AM  Result Value Ref Range   B Natriuretic Peptide 158.2 (H) 0.0 - 100.0 pg/mL    Comment: Performed at [facility], [address], [place], [place] [address]  SARS Coronavirus 2 by RT PCR (hospital order, performed in [facility] hospital lab) *cepheid single result test* Anterior Nasal Swab     Status: None   Collection Time: 06/14/23  2:47 AM   Specimen: Anterior Nasal Swab  Result Value Ref Range   SARS Coronavirus 2 by RT PCR NEGATIVE NEGATIVE    Comment: Performed at [facility], [address], [place], [place] [address]  I-Stat venous blood gas, ED     Status: Abnormal   Collection Time: 06/14/23  3:38 AM  Result Value  Ref Range   pH, Ven 7.310 7.25 - 7.43   pCO2, Ven 81.8 (HH) 44 - 60 mmHg   pO2, Ven 137 (H) 32 - 45 mmHg   Bicarbonate 41.2 (H) 20.0 - 28.0 mmol/L   TCO2 44 (H) 22 - 32 mmol/L   O2 Saturation 99 %   Acid-Base Excess 12.0 (H) 0.0 - 2.0 mmol/L   Sodium 139 135 - 145 mmol/L   Potassium 3.4 (L) 3.5 - 5.1 mmol/L   Calcium, Ion 1.07 (L) 1.15 - 1.40 mmol/L   HCT 37.0 36.0 - 46.0 %   Hemoglobin 12.6 12.0 - 15.0 g/dL   Sample type VENOUS    Comment NOTIFIED PHYSICIAN   I-stat chem 8, ED     Status: Abnormal   Collection Time: 06/14/23  3:38 AM  Result Value Ref Range   Sodium 139 135 - 145 mmol/L   Potassium 3.5 3.5 - 5.1 mmol/L   Chloride 91 (L) 98 - 111 mmol/L   BUN 23 (H) 6 - 20 mg/dL   Creatinine, Ser 6.44 (H) 0.44 - 1.00 mg/dL   Glucose, Bld 034 (H) 70 - 99 mg/dL    Comment: Glucose reference range applies only to samples taken after fasting  for at least 8 hours.   Calcium, Ion 1.07 (L) 1.15 - 1.40 mmol/L   TCO2 37 (H) 22 - 32 mmol/L   Hemoglobin 12.9 12.0 - 15.0 g/dL   HCT 16.1 09.6 - 04.5 %  I-Stat Lactic Acid     Status: Abnormal   Collection Time: 06/14/23  3:39 AM  Result Value Ref Range   Lactic Acid, Venous 2.4 (HH) 0.5 - 1.9 mmol/L   Comment NOTIFIED PHYSICIAN   Resp panel by RT-PCR (RSV, Flu A&B, Covid) Anterior Nasal Swab     Status: None   Collection Time: 06/14/23  3:53 AM   Specimen: Anterior Nasal Swab  Result Value Ref Range   SARS Coronavirus 2 by RT PCR NEGATIVE NEGATIVE   Influenza A by PCR NEGATIVE NEGATIVE   Influenza B by PCR NEGATIVE NEGATIVE    Comment: (NOTE) The Xpert Xpress SARS-CoV-2/FLU/RSV plus assay is intended as an aid in the diagnosis of influenza from Nasopharyngeal swab specimens and should not be used as a sole basis for treatment. Nasal washings and aspirates are unacceptable for Xpert Xpress SARS-CoV-2/FLU/RSV testing.  Fact Sheet for Patients: BloggerCourse.com  Fact Sheet for Healthcare  Providers: SeriousBroker.it  This test is not yet approved or cleared by the [place] FDA and has been authorized for detection and/or diagnosis of SARS-CoV-2 by FDA under an Emergency Use Authorization (EUA). This EUA will remain in effect (meaning this test can be used) for the duration of the COVID-19 declaration under Section 564(b)(1) of the Act, 21 U.S.C. section 360bbb-3(b)(1), unless the authorization is terminated or revoked.     Resp Syncytial Virus by PCR NEGATIVE NEGATIVE    Comment: (NOTE) Fact Sheet for Patients: BloggerCourse.com  Fact Sheet for Healthcare Providers: SeriousBroker.it  This test is not yet approved or cleared by the [place] FDA and has been authorized for detection and/or diagnosis of SARS-CoV-2 by FDA under an Emergency Use Authorization (EUA). This EUA will remain in effect (meaning this test can be used) for the duration of the COVID-19 declaration under Section 564(b)(1) of the Act, 21 U.S.C. section 360bbb-3(b)(1), unless the authorization is terminated or revoked.  Performed at [facility], [address] [place], [place] [address]   Respiratory (~20 pathogens) panel by PCR     Status: None   Collection Time: 06/14/23  3:53 AM   Specimen: Nasopharyngeal Swab; Respiratory  Result Value Ref Range   Adenovirus NOT DETECTED NOT DETECTED   Coronavirus 229E NOT DETECTED NOT DETECTED    Comment: (NOTE) The Coronavirus on the Respiratory Panel, DOES NOT test for the novel  Coronavirus (2019 nCoV)    Coronavirus HKU1 NOT DETECTED NOT DETECTED   Coronavirus NL63 NOT DETECTED NOT DETECTED   Coronavirus OC43 NOT DETECTED NOT DETECTED   Metapneumovirus NOT DETECTED NOT DETECTED   Rhinovirus / Enterovirus NOT DETECTED NOT DETECTED   Influenza A NOT DETECTED NOT DETECTED   Influenza B NOT DETECTED NOT DETECTED   Parainfluenza Virus 1 NOT DETECTED NOT  DETECTED   Parainfluenza Virus 2 NOT DETECTED NOT DETECTED   Parainfluenza Virus 3 NOT DETECTED NOT DETECTED   Parainfluenza Virus 4 NOT DETECTED NOT DETECTED   Respiratory Syncytial Virus NOT DETECTED NOT DETECTED   Bordetella pertussis NOT DETECTED NOT DETECTED   Bordetella Parapertussis NOT DETECTED NOT DETECTED   Chlamydophila pneumoniae NOT DETECTED NOT DETECTED   Mycoplasma pneumoniae NOT DETECTED NOT DETECTED    Comment: Performed at [facility], [address],  [place], [place] [address]  Troponin I (High Sensitivity)     Status: None   Collection Time: 06/14/23  4:44 AM  Result Value Ref Range   Troponin I (High Sensitivity) 7 <18 ng/L    Comment: (NOTE) Elevated high sensitivity troponin I (hsTnI) values and significant  changes across serial measurements may suggest ACS but many other  chronic and acute conditions are known to elevate hsTnI results.  Refer to the "Links" section for chest pain algorithms and additional  guidance. Performed at [facility], [address], [place], [place] [address]   I-Stat Lactic Acid     Status: None   Collection Time: 06/14/23  4:55 AM  Result Value Ref Range   Lactic Acid, Venous 1.5 0.5 - 1.9 mmol/L   CT Chest Wo Contrast  Result Date: 06/14/2023 CLINICAL DATA:  Mental status changes. Pneumonia. Respiratory distress. EXAM: CT CHEST WITHOUT CONTRAST TECHNIQUE: Multidetector CT imaging of the chest was performed following the standard protocol without IV contrast. RADIATION DOSE REDUCTION: This exam was performed according to the departmental dose-optimization program which includes automated exposure control, adjustment of the mA and/or kV according to patient size and/or use of iterative reconstruction technique. COMPARISON:  07/29/2022 FINDINGS: Cardiovascular: The heart is enlarged. No substantial pericardial effusion. Mild atherosclerotic calcification is noted in the wall of the thoracic aorta. Enlargement of the  pulmonary outflow tract/main pulmonary arteries suggests pulmonary arterial hypertension. Mediastinum/Nodes: Increased number of lymph nodes are seen in the mediastinum, some of which are mildly enlarged including index 13 mm precarinal node on 48/3. Mild lymphadenopathy suspected in the hilar regions although hila are poorly evaluated on noncontrast CT imaging. The esophagus has normal imaging features. There is no axillary lymphadenopathy. Lungs/Pleura: Low lung volumes. There is patchy consolidative airspace disease in the lower lobes bilaterally with patchy and nodular consolidative airspace opacity in both upper lobes and the right middle lobe. Trace left pleural effusion. Upper Abdomen: Visualized portion of the upper abdomen is unremarkable. Musculoskeletal: No worrisome lytic or sclerotic osseous abnormality. IMPRESSION: 1. Patchy consolidative airspace disease in the lower lobes bilaterally with patchy and nodular consolidative airspace opacity in both upper lobes and the right middle lobe. Imaging features compatible with multifocal pneumonia. Given the nodular character of disease in some regions, close follow-up suggested and repeat CT chest in 3 months recommended to ensure resolution 2. Trace left pleural effusion. 3. Increased number of lymph nodes in the mediastinum, some of which are mildly enlarged. These are likely reactive. Attention on follow-up recommended. 4. Enlargement of the pulmonary outflow tract/main pulmonary arteries suggests pulmonary arterial hypertension. 5.  Aortic Atherosclerosis (ICD10-I70.0). Electronically Signed   By: Kennith Center M.D.   On: 06/14/2023 05:02   CT Head Wo Contrast  Result Date: 06/14/2023 CLINICAL DATA:  Mental status change with unknown cause. EXAM: CT HEAD WITHOUT CONTRAST TECHNIQUE: Contiguous axial images were obtained from the base of the skull through the vertex without intravenous contrast. RADIATION DOSE REDUCTION: This exam was performed according  to the departmental dose-optimization program which includes automated exposure control, adjustment of the mA and/or kV according to patient size and/or use of iterative reconstruction technique. COMPARISON:  12/08/2010 FINDINGS: Brain: No evidence of acute infarction, hemorrhage, hydrocephalus, extra-axial collection or mass lesion/mass effect. Vascular: No hyperdense vessel or unexpected calcification. Skull: Normal. Negative for fracture or focal lesion. Sinuses/Orbits: Mucosal thickening in the left sphenoid sinus. Bilateral mastoid opacification, partial. Negative nasopharynx. Symmetric proptosis likely from retro-orbital fat. IMPRESSION: 1. No acute  finding. 2. Bilateral mastoid and sphenoid sinus opacification. Electronically Signed   By: Tiburcio Pea M.D.   On: 06/14/2023 04:35   DG Chest Port 1 View  Result Date: 06/14/2023 CLINICAL DATA:  Shortness of breath EXAM: PORTABLE CHEST 1 VIEW COMPARISON:  Chest x-ray 06/03/2023. FINDINGS: The heart is enlarged. There central pulmonary vascular congestion and central interstitial opacities there is no lung infiltrate. Costophrenic angles are clear. No pneumothorax or acute fracture. IMPRESSION: Cardiomegaly with central pulmonary vascular congestion and central interstitial opacities, findings suggestive of CHF. Electronically Signed   By: Darliss Cheney M.D.   On: 06/14/2023 02:52    Pending Labs Unresulted Labs (From admission, onward)     Start     Ordered   06/14/23 0242  Culture, blood (routine x 2)  BLOOD CULTURE X 2,   R (with STAT occurrences)      06/14/23 0241            Vitals/Pain Today's Vitals   06/14/23 0712 06/14/23 0715 06/14/23 0730 06/14/23 0745  BP:  119/63 105/61 111/88  Pulse:  84 79 80  Resp:  17 17 16   Temp: 100.1 F (37.8 C)     TempSrc: Rectal     SpO2:  96% 97% 100%  PainSc:        Isolation Precautions Droplet precaution  Medications Medications  ipratropium-albuterol (DUONEB) 0.5-2.5 (3) MG/3ML  nebulizer solution 3 mL (3 mLs Nebulization Given 06/14/23 0250)  ceFEPIme (MAXIPIME) 2 g in sodium chloride 0.9 % 100 mL IVPB (0 g Intravenous Stopped 06/14/23 0352)  vancomycin (VANCOREADY) IVPB 2000 mg/400 mL (0 mg Intravenous Stopped 06/14/23 0553)  acetaminophen (TYLENOL) suppository 650 mg (650 mg Rectal Given 06/14/23 0444)    Mobility walks with device     Focused Assessments Pulmonary Assessment Handoff:  Lung sounds: Bilateral Breath Sounds: Diminished L Breath Sounds: Diminished R Breath Sounds: Diminished O2 Device: Bi-PAP      R Recommendations: See Admitting Provider Note  Report given to:   Additional Notes:

## 2023-06-14 NOTE — Assessment & Plan Note (Addendum)
Last A1c 8.5 05/29/23, home meds include jardiance, will start resistant sliding scale and jardiance when eating.  -Jadiance 10 mg dialy -Resistant sliding scale -Cbg checks 4 x daily qac&qhs

## 2023-06-14 NOTE — Progress Notes (Signed)
Settings weaned to last known.   06/14/23 0435  BiPAP/CPAP/SIPAP  Respiratory Rate 18 breaths/min  Pressure Support  (6 above PEEP)  PEEP (S)  8 cmH20  Minute Ventilation 7.8  Leak 19  Peak Inspiratory Pressure (PIP) (S)  14  Tidal Volume (Vt) 462  Press High Alarm 25 cmH2O

## 2023-06-15 DIAGNOSIS — J189 Pneumonia, unspecified organism: Secondary | ICD-10-CM | POA: Diagnosis not present

## 2023-06-15 DIAGNOSIS — E876 Hypokalemia: Secondary | ICD-10-CM | POA: Diagnosis present

## 2023-06-15 LAB — CBC
HCT: 35.3 % — ABNORMAL LOW (ref 36.0–46.0)
Hemoglobin: 10.6 g/dL — ABNORMAL LOW (ref 12.0–15.0)
MCH: 29.9 pg (ref 26.0–34.0)
MCHC: 30 g/dL (ref 30.0–36.0)
MCV: 99.7 fL (ref 80.0–100.0)
Platelets: 200 10*3/uL (ref 150–400)
RBC: 3.54 MIL/uL — ABNORMAL LOW (ref 3.87–5.11)
RDW: 15.5 % (ref 11.5–15.5)
WBC: 7.7 10*3/uL (ref 4.0–10.5)
nRBC: 0 % (ref 0.0–0.2)

## 2023-06-15 LAB — GLUCOSE, CAPILLARY
Glucose-Capillary: 190 mg/dL — ABNORMAL HIGH (ref 70–99)
Glucose-Capillary: 211 mg/dL — ABNORMAL HIGH (ref 70–99)
Glucose-Capillary: 184 mg/dL — ABNORMAL HIGH (ref 70–99)

## 2023-06-15 LAB — BASIC METABOLIC PANEL
Anion gap: 7 (ref 5–15)
BUN: 17 mg/dL (ref 6–20)
CO2: 39 mmol/L — ABNORMAL HIGH (ref 22–32)
Calcium: 8.1 mg/dL — ABNORMAL LOW (ref 8.9–10.3)
Chloride: 92 mmol/L — ABNORMAL LOW (ref 98–111)
Creatinine, Ser: 0.86 mg/dL (ref 0.44–1.00)
GFR, Estimated: >60 mL/min (ref >60)
Glucose, Bld: 184 mg/dL — ABNORMAL HIGH (ref 70–99)
Potassium: 3 mmol/L — ABNORMAL LOW (ref 3.5–5.1)
Sodium: 138 mmol/L (ref 135–145)

## 2023-06-15 LAB — MRSA NEXT GEN BY PCR, NASAL: MRSA by PCR Next Gen: DETECTED — AB

## 2023-06-15 MED ORDER — VANCOMYCIN HCL 2000 MG/400ML IV SOLN: 2000.0000 mg | INTRAVENOUS | Status: DC

## 2023-06-15 MED ORDER — POTASSIUM CHLORIDE CRYS ER 20 MEQ PO TBCR
40.0000 meq | EXTENDED_RELEASE_TABLET | Freq: Once | ORAL | Status: AC
  Administered 2023-06-15: 40 meq via ORAL
  Filled 2023-06-15: qty 2

## 2023-06-15 MED ORDER — VANCOMYCIN HCL IN DEXTROSE 1-5 GM/200ML-% IV SOLN
1000.0000 mg | Freq: Two times a day (BID) | INTRAVENOUS | Status: AC
  Administered 2023-06-16 – 2023-06-20 (×10): 1000 mg via INTRAVENOUS
  Filled 2023-06-15 (×11): qty 200

## 2023-06-15 MED ORDER — BUDESONIDE 0.5 MG/2ML IN SUSP
0.5000 mg | Freq: Two times a day (BID) | RESPIRATORY_TRACT | Status: DC
  Administered 2023-06-15 – 2023-06-23 (×14): 0.5 mg via RESPIRATORY_TRACT
  Filled 2023-06-15 (×18): qty 2

## 2023-06-15 NOTE — Assessment & Plan Note (Addendum)
Afeb ON. Bcx NGTD x1day.  - Cont Cefepime. Consider de-escalating to CTX or PO if continues to improve tomorrow. - Cont Vanc. MRSA swab positive. - Cont Bipap while sleeping or napping

## 2023-06-15 NOTE — Progress Notes (Signed)
Pharmacy Antibiotic Note  Chelsea [name] is a 57 y.o. female admitted on 06/14/2023 with pneumonia.  Patient received cefepime 2g x1 and vancomycin 2g x1 in the ED. Pharmacy has been consulted for vancomycin and cefepime dosing. CT scan showing multifocal pneumonia. Treating for HAP.  Scr improved from 1.3 to 0.86. Will increase vancomycin dose to keep AUC therapeutic.  Plan: Change Vancomycin to 1000mg  IV Q12H (Scr 0.86, eAUC 455.1), Goal AUC 400-550 Obtain levels as indicated Cefepime 2g IV Q8H F/u cultures, renal function, clinical progression, & LOT  Temp (24hrs), Avg:98.4 F (36.9 C), Min:97.7 F (36.5 C), Max:98.7 F (37.1 C)  Recent Labs  Lab 06/14/23 0235 06/14/23 0338 06/14/23 0339 06/14/23 0455 06/15/23 0353  WBC 8.6  --   --   --  7.7  CREATININE 1.13* 1.30*  --   --  0.86  LATICACIDVEN  --   --  2.4* 1.5  --     Estimated Creatinine Clearance: 106.6 mL/min (by C-G formula based on SCr of 0.86 mg/dL).    Allergies  Allergen Reactions   Iodinated Contrast Media Anaphylaxis   Penicillins Anaphylaxis    Patient tolerates cefepime (08/2017)   Insulin Lispro Other (See Comments)    Adder per [facility] from SNF   Minoxidil Hives    Antimicrobials this admission: 9/13 Vancomycin >>  9/13 Cefepime >>   Dose adjustments: Vanco 1500 q24h > 1000 q12h  Microbiology results: 9/13 BCx: no growth to date x1 MRSA PCR (+)  Thank you for allowing pharmacy to be a part of this patient's care.  Nicole Kindred, PharmD PGY1 Pharmacy Resident 06/15/2023 2:21 PM

## 2023-06-15 NOTE — Progress Notes (Signed)
Daily Progress Note Intern Pager: [phone]  Patient name: Chelsea [name] Medical record number: [id number] Date of birth: Jun 13, 1966 Age: 57 y.o. Gender: female  Primary Care Provider: Pcp, No Consultants: NONE Code Status: fULL  Pt Overview and Major Events to Date:  9/14 admitted  Assessment and Plan: PARISA MCGOWN is a 57 y.o. female with a PMHx of HFpEF, aortic valve steonosis, paroxysmal afib/flutter, chronic respiratory failure on continuous O2 (5L home), OSH/OSA and COPD admitted for multifocal PNA.  Assessment & Plan Multifocal pneumonia Afeb ON. Bcx NGTD x1day.  - Cont Cefepime. Consider de-escalating to CTX or PO if continues to improve tomorrow. - Cont Vanc. MRSA swab positive. - Cont Bipap while sleeping or napping DM2 (diabetes mellitus, type 2) (HCC) Glucose appropriate - Jardiance 10mg  daily - resistant SSI Acute respiratory failure with hypoxia and hypercarbia (HCC) - Cont home pulmicort - Cont home  -Target O2 sats 88-92 Hypokalemia Low to 3.0. Replete K PO. - AM BMP, Mg, CBC AKI (acute kidney injury) (HCC) (Resolved: 06/15/2023) Improved. Off fluids.  Chronic and Stable Problems:  HFpEF - Continue home Spironolactone 25 mg daily and Torsemide 40 mg BID  Paroxsymal a/fib/flutter - continue amiodarone 200 mg BID then 200 mg daily starting 06/22/23. Eliquis 5mg  BID.  COPD - Continue Pulmicort nebulizer BID, goal O2 88-92%  Restless Leg Syndrome - Continue requip 0.25 mg qhs and gabapentin 300 mg TID Chronic IDA - Continue ferous sulfate 325 mg daily Hypothyroidism - Continue Synthroid 150 mcg daily HLD - Continue atorvostatin 20 mg dialy Anxiety - wellbutrin 100mg  bid, atarax 25mg  TID PRN    FEN/GI: Heart healthy and carb modified diet PPx: Cont Home eliquis Dispo:SNF pending clinical improvement . Barriers include continued need for IV antibx.   Subjective:  Pt reports that she feels like her breathing is doing better today  compared to admission. No acute complaints this morning.  Objective: Temp:  [98.4 F (36.9 C)-98.8 F (37.1 C)] 98.4 F (36.9 C) (09/14 0800) Pulse Rate:  [68-88] 68 (09/14 0800) Resp:  [17-21] 19 (09/13 2121) BP: (90-120)/(52-74) 91/60 (09/14 0800) SpO2:  [91 %-100 %] 97 % (09/14 0800) Weight:  [154.2 kg] 154.2 kg (09/13 1438) Physical Exam: General: Ssleepy, but easily awakens to voice. Obese woman laying comfortably in bed. NAD. Cardiovascular: RRR. 3/6 systolic murmur best heard along L sternal border.  Respiratory: CTAB, no wheezing or crackles.  Receiving nebulizer treatment.  Abdomen: Soft, nontender, nondistended.  Normal BS.  Laboratory: Most recent CBC Lab Results  Component Value Date   WBC 7.7 06/15/2023   HGB 10.6 (L) 06/15/2023   HCT 35.3 (L) 06/15/2023   MCV 99.7 06/15/2023   PLT 200 06/15/2023   Most recent BMP    Latest Ref Rng & Units 06/15/2023    3:53 AM  BMP  Glucose 70 - 99 mg/dL 725   BUN 6 - 20 mg/dL 17   Creatinine 3.66 - 1.00 mg/dL 4.40   Sodium 347 - 425 mmol/L 138   Potassium 3.5 - 5.1 mmol/L 3.0   Chloride 98 - 111 mmol/L 92   CO2 22 - 32 mmol/L 39   Calcium 8.9 - 10.3 mg/dL 8.1    Lincoln Brigham, MD 06/15/2023, 10:48 AM  PGY-2, [facility] FPTS Intern pager: [phone], text pages welcome Secure chat group [facility] Teaching Service

## 2023-06-15 NOTE — Progress Notes (Signed)
Placed patient on Bipap for the night with oxygen set at 7lpm

## 2023-06-15 NOTE — Assessment & Plan Note (Signed)
Low to 3.0. Replete K PO. - AM BMP, Mg, CBC

## 2023-06-15 NOTE — Assessment & Plan Note (Signed)
Improved. Off fluids.

## 2023-06-15 NOTE — Assessment & Plan Note (Signed)
-   Cont home pulmicort - Cont home  -Target O2 sats 88-92

## 2023-06-15 NOTE — Assessment & Plan Note (Signed)
Glucose appropriate - Jardiance 10mg  daily - resistant SSI

## 2023-06-15 NOTE — Plan of Care (Signed)
Problem: Education: Goal: Ability to describe self-care measures that may prevent or decrease complications (Diabetes Survival Skills Education) will improve Outcome: Progressing   Problem: Coping: Goal: Ability to adjust to condition or change in health will improve Outcome: Progressing   Problem: Fluid Volume: Goal: Ability to maintain a balanced intake and output will improve Outcome: Progressing   Problem: Health Behavior/Discharge Planning: Goal: Ability to identify and utilize available resources and services will improve Outcome: Progressing Goal: Ability to manage health-related needs will improve Outcome: Progressing   Problem: Metabolic: Goal: Ability to maintain appropriate glucose levels will improve Outcome: Progressing   Problem: Nutritional: Goal: Maintenance of adequate nutrition will improve Outcome: Progressing   Problem: Skin Integrity: Goal: Risk for impaired skin integrity will decrease Outcome: Progressing   Problem: Tissue Perfusion: Goal: Adequacy of tissue perfusion will improve Outcome: Progressing

## 2023-06-16 ENCOUNTER — Inpatient Hospital Stay (HOSPITAL_COMMUNITY): Payer: Medicare Other

## 2023-06-16 DIAGNOSIS — J189 Pneumonia, unspecified organism: Secondary | ICD-10-CM | POA: Diagnosis not present

## 2023-06-16 LAB — BLOOD GAS, VENOUS
Acid-Base Excess: 23.7 mmol/L — ABNORMAL HIGH (ref 0.0–2.0)
Bicarbonate: 53 mmol/L — ABNORMAL HIGH (ref 20.0–28.0)
O2 Saturation: 66.4 %
Patient temperature: 36.6
pCO2, Ven: 77 mmHg (ref 44–60)
pH, Ven: 7.45 — ABNORMAL HIGH (ref 7.25–7.43)
pO2, Ven: 35 mmHg (ref 32–45)

## 2023-06-16 LAB — CBC
HCT: 38.7 % (ref 36.0–46.0)
Hemoglobin: 11.8 g/dL — ABNORMAL LOW (ref 12.0–15.0)
MCH: 30.1 pg (ref 26.0–34.0)
MCHC: 30.5 g/dL (ref 30.0–36.0)
MCV: 98.7 fL (ref 80.0–100.0)
Platelets: 198 10*3/uL (ref 150–400)
RBC: 3.92 MIL/uL (ref 3.87–5.11)
RDW: 15.4 % (ref 11.5–15.5)
WBC: 7.3 10*3/uL (ref 4.0–10.5)
nRBC: 0 % (ref 0.0–0.2)

## 2023-06-16 LAB — GLUCOSE, CAPILLARY
Glucose-Capillary: 164 mg/dL — ABNORMAL HIGH (ref 70–99)
Glucose-Capillary: 206 mg/dL — ABNORMAL HIGH (ref 70–99)
Glucose-Capillary: 126 mg/dL — ABNORMAL HIGH (ref 70–99)
Glucose-Capillary: 134 mg/dL — ABNORMAL HIGH (ref 70–99)

## 2023-06-16 LAB — BASIC METABOLIC PANEL
Anion gap: 15 (ref 5–15)
BUN: 18 mg/dL (ref 6–20)
CO2: 35 mmol/L — ABNORMAL HIGH (ref 22–32)
Calcium: 8.6 mg/dL — ABNORMAL LOW (ref 8.9–10.3)
Chloride: 90 mmol/L — ABNORMAL LOW (ref 98–111)
Creatinine, Ser: 0.78 mg/dL (ref 0.44–1.00)
GFR, Estimated: >60 mL/min (ref >60)
Glucose, Bld: 176 mg/dL — ABNORMAL HIGH (ref 70–99)
Potassium: 3.3 mmol/L — ABNORMAL LOW (ref 3.5–5.1)
Sodium: 140 mmol/L (ref 135–145)

## 2023-06-16 LAB — COMPREHENSIVE METABOLIC PANEL
ALT: 11 U/L (ref 0–44)
AST: 12 U/L — ABNORMAL LOW (ref 15–41)
Albumin: 2.9 g/dL — ABNORMAL LOW (ref 3.5–5.0)
Alkaline Phosphatase: 80 U/L (ref 38–126)
Anion gap: 13 (ref 5–15)
BUN: 15 mg/dL (ref 6–20)
CO2: 41 mmol/L — ABNORMAL HIGH (ref 22–32)
Calcium: 9 mg/dL (ref 8.9–10.3)
Chloride: 88 mmol/L — ABNORMAL LOW (ref 98–111)
Creatinine, Ser: 0.73 mg/dL (ref 0.44–1.00)
GFR, Estimated: >60 mL/min (ref >60)
Glucose, Bld: 129 mg/dL — ABNORMAL HIGH (ref 70–99)
Potassium: 3.5 mmol/L (ref 3.5–5.1)
Sodium: 142 mmol/L (ref 135–145)
Total Bilirubin: 0.8 mg/dL (ref 0.3–1.2)
Total Protein: 7.1 g/dL (ref 6.5–8.1)

## 2023-06-16 LAB — PROCALCITONIN: Procalcitonin: <0.1 ng/mL

## 2023-06-16 LAB — TROPONIN I (HIGH SENSITIVITY)
Troponin I (High Sensitivity): 5 ng/L (ref <18)
Troponin I (High Sensitivity): 5 ng/L (ref <18)

## 2023-06-16 LAB — LACTIC ACID, PLASMA: Lactic Acid, Venous: 0.8 mmol/L (ref 0.5–1.9)

## 2023-06-16 LAB — MAGNESIUM: Magnesium: 1.8 mg/dL (ref 1.7–2.4)

## 2023-06-16 MED ORDER — FUROSEMIDE 10 MG/ML IJ SOLN
80.0000 mg | Freq: Once | INTRAMUSCULAR | Status: AC
  Administered 2023-06-16: 80 mg via INTRAVENOUS
  Filled 2023-06-16: qty 8

## 2023-06-16 MED ORDER — ROPINIROLE HCL 0.5 MG PO TABS
0.5000 mg | ORAL_TABLET | Freq: Every day | ORAL | Status: DC
  Administered 2023-06-16 – 2023-06-23 (×8): 0.5 mg via ORAL
  Filled 2023-06-16 (×9): qty 1

## 2023-06-16 MED ORDER — HYDROXYZINE HCL 25 MG PO TABS
50.0000 mg | ORAL_TABLET | Freq: Three times a day (TID) | ORAL | Status: DC | PRN
  Administered 2023-06-16 – 2023-06-24 (×10): 50 mg via ORAL
  Filled 2023-06-16 (×10): qty 2

## 2023-06-16 MED ORDER — ACETAZOLAMIDE SODIUM 500 MG IJ SOLR
500.0000 mg | Freq: Once | INTRAMUSCULAR | Status: AC
  Administered 2023-06-16: 500 mg via INTRAVENOUS
  Filled 2023-06-16: qty 500

## 2023-06-16 MED ORDER — PANTOPRAZOLE SODIUM 40 MG IV SOLR
40.0000 mg | Freq: Every day | INTRAVENOUS | Status: DC
  Administered 2023-06-16: 40 mg via INTRAVENOUS
  Filled 2023-06-16: qty 10

## 2023-06-16 MED ORDER — FUROSEMIDE 10 MG/ML IJ SOLN
80.0000 mg | Freq: Two times a day (BID) | INTRAMUSCULAR | Status: DC
  Administered 2023-06-17 – 2023-06-18 (×4): 80 mg via INTRAVENOUS
  Filled 2023-06-16 (×4): qty 8

## 2023-06-16 MED ORDER — POTASSIUM CHLORIDE CRYS ER 20 MEQ PO TBCR
40.0000 meq | EXTENDED_RELEASE_TABLET | Freq: Once | ORAL | Status: AC
  Administered 2023-06-16: 40 meq via ORAL
  Filled 2023-06-16: qty 2

## 2023-06-16 MED ORDER — HYDROMORPHONE HCL 1 MG/ML IJ SOLN
0.5000 mg | INTRAMUSCULAR | Status: DC | PRN
  Administered 2023-06-16 – 2023-06-20 (×5): 0.5 mg via INTRAVENOUS
  Filled 2023-06-16 (×5): qty 0.5

## 2023-06-16 MED ORDER — MAGNESIUM SULFATE 2 GM/50ML IV SOLN
2.0000 g | Freq: Once | INTRAVENOUS | Status: AC
  Administered 2023-06-16: 2 g via INTRAVENOUS
  Filled 2023-06-16: qty 50

## 2023-06-16 NOTE — Consult Note (Signed)
NAME:  Chelsea [name], MRN:  [id number], DOB:  04/09/1966, LOS: 2 ADMISSION DATE:  06/14/2023, CONSULTATION DATE: 06/16/2023 REFERRING MD: Danise Edge, CHIEF COMPLAINT: Hypercapnic respiratory failure  History of Present Illness:  Chelsea [name] is a 57 year old female with extensive past medical history significant for but not limited to CHF with CAD, COPD, OSA/OSH, chronic hypoxic and hypercapnic respiratory failure, A-fib, HLD, HTN, diabetes, and chronic back pain who presented to the ED 9/13 with complaints of shortness of breath and altered mental status.  She was admitted per family medicine residency for management of multifocal pneumonia.  Afternoon of 9/15 VBG obtained and revealed pH 7.45, pCO2 77, pO2 35, bicarb 53 for which pulmonary was consulted for assistance in management.  Pertinent  Medical History  CHF with CAD, COPD, OSA/OSH, chronic hypoxic and hypercapnic respiratory failure, A-fib, HLD, HTN, diabetes, and chronic back pain  Significant Hospital Events: Including procedures, antibiotic start and stop dates in addition to other pertinent events   9/14  Interim History / Subjective:  As above  Objective   Blood pressure 115/69, pulse 80, temperature 98.9 F (37.2 C), temperature source Oral, resp. rate 20, height 5\' 5"  (1.651 m), weight (!) 148.5 kg, SpO2 94%.        Intake/Output Summary (Last 24 hours) at 06/16/2023 1724 Last data filed at 06/16/2023 1714 Gross per 24 hour  Intake 240 ml  Output 3000 ml  Net -2760 ml   Filed Weights   06/14/23 1438 06/16/23 0556  Weight: (!) 154.2 kg (!) 148.5 kg    Examination: General: Acute on chronically ill appearing morbidly obese middle aged female lying in bed on BIPAP, in NAD HEENT: [place]/AT, MM pink/moist, PERRL,  Neuro: Sleepy but arousable  CV: s1s2 regular rate and rhythm, no murmur, rubs, or gallops,  PULM:  Diminished bilaterally, no increased work of breathing, on BIPAP  GI: soft, bowel sounds active in  all 4 quadrants, non-tender, non-distended  Extremities: warm/dry, no edema  Skin: no rashes or lesions  Resolved Hospital Problem list     Assessment & Plan:  Acute on chronic hypoxic and hypercapnic respiratory failure History of prior tracheostomy  -Unsure when or why patient was decanullated  COPD OSA/OSH Multifocal pneumonia  P: Continue BIPAP  Empiric Cefepime and Vancomycin  Repeat ABG as needed  Continue BDs  Check CMP and lactic  May need to consider redo trach for better management of chronic hypoxic and hypercapnic respiratory failure and noncompliance with BIPAP therapy   Best Practice (right click and "Reselect all SmartList Selections" daily)  Per primary   Labs   CBC: Recent Labs  Lab 06/14/23 0235 06/14/23 0338 06/15/23 0353 06/16/23 0429  WBC 8.6  --  7.7 7.3  NEUTROABS 5.5  --   --   --   HGB 11.4* 12.9  12.6 10.6* 11.8*  HCT 37.8 38.0  37.0 35.3* 38.7  MCV 101.1*  --  99.7 98.7  PLT 224  --  200 198    Basic Metabolic Panel: Recent Labs  Lab 06/14/23 0235 06/14/23 0338 06/15/23 0353 06/16/23 0429  NA 140 139  139 138 140  K 3.5 3.5  3.4* 3.0* 3.3*  CL 89* 91* 92* 90*  CO2 36*  --  39* 35*  GLUCOSE 248* 251* 184* 176*  BUN 19 23* 17 18  CREATININE 1.13* 1.30* 0.86 0.78  CALCIUM 8.6*  --  8.1* 8.6*  MG  --   --   --  1.8  GFR: Estimated Creatinine Clearance: 111.9 mL/min (by C-G formula based on SCr of 0.78 mg/dL). Recent Labs  Lab 06/14/23 0235 06/14/23 0339 06/14/23 0455 06/15/23 0353 06/16/23 0429  WBC 8.6  --   --  7.7 7.3  LATICACIDVEN  --  2.4* 1.5  --   --     Liver Function Tests: Recent Labs  Lab 06/14/23 0235  AST 13*  ALT 11  ALKPHOS 97  BILITOT 0.8  PROT 7.1  ALBUMIN 3.1*   No results for input(s): "LIPASE", "AMYLASE" in the last 168 hours. No results for input(s): "AMMONIA" in the last 168 hours.  ABG    Component Value Date/Time   PHART 7.384 04/09/2022 1932   PCO2ART 46.6 04/09/2022 1932    PO2ART 106 04/09/2022 1932   HCO3 53.0 (H) 06/16/2023 1605   TCO2 44 (H) 06/14/2023 0338   TCO2 37 (H) 06/14/2023 0338   O2SAT 66.4 06/16/2023 1605     Coagulation Profile: No results for input(s): "INR", "PROTIME" in the last 168 hours.  Cardiac Enzymes: No results for input(s): "CKTOTAL", "CKMB", "CKMBINDEX", "TROPONINI" in the last 168 hours.  HbA1C: Hgb A1c MFr Bld  Date/Time Value Ref Range Status  05/29/2023 11:43 AM 8.5 (H) 4.8 - 5.6 % Final    Comment:    (NOTE) Pre diabetes:          5.7%-6.4%  Diabetes:              >6.4%  Glycemic control for   <7.0% adults with diabetes   09/19/2022 12:32 AM 6.1 (H) 4.8 - 5.6 % Final    Comment:    (NOTE)         Prediabetes: 5.7 - 6.4         Diabetes: >6.4         Glycemic control for adults with diabetes: <7.0     CBG: Recent Labs  Lab 06/15/23 1552 06/15/23 2127 06/16/23 0554 06/16/23 1257 06/16/23 1711  GLUCAP 211* 184* 164* 206* 126*    Review of Systems:   Unable to assess   Past Medical History:  She,  has a past medical history of Acquired hypothyroidism (11/13/2010), Acute congestive heart failure (HCC) (03/18/2021), Acute idiopathic gout of left hand (02/16/2019), Acute kidney injury (HCC) (05/29/2023), Acute on chronic diastolic CHF (congestive heart failure) (HCC) (09/25/2017), Acute on chronic diastolic CHF (congestive heart failure), NYHA class 3 (HCC) (09/25/2017), Acute on chronic hypoxic respiratory failure (HCC) (07/04/2022), Acute on chronic respiratory failure with hypoxia (HCC) (09/17/2022), Acute on chronic respiratory failure with hypoxia and hypercapnia (HCC) (04/07/2022), Acute respiratory failure with hypoxia and hypercarbia (HCC) (05/27/2016), Arthritis, Asthma, Atrial flutter (HCC) (03/09/2021), Bleeding of the respiratory tract (04/07/2022), Bronchitis (05/30/2011), Cervical cancer (HCC) (2006), CHF (congestive heart failure) (HCC), CHF exacerbation (HCC) (07/30/2022), Chronic diastolic heart  failure (HCC) (11/14/2010), Chronic heart failure with preserved ejection fraction (HCC) (02/23/2018), Chronic lower back pain, Chronic respiratory failure with hypoxia (HCC) (07/02/2018), Complication of anesthesia, COPD (chronic obstructive pulmonary disease) with chronic bronchitis (07/18/2022), COPD with acute exacerbation (HCC) (03/04/2015), Coronary artery disease, Cough productive of clear sputum (06/03/2023), Diarrhea (06/18/2019), Diastolic congestive heart failure (HCC), DJD (degenerative joint disease) (07/03/2021), DM2 (diabetes mellitus, type 2) (HCC) (11/10/2010), Dyspnea (12/05/2017), Dyspnea on exertion (02/09/2021), Edema (03/22/2011), Essential (primary) hypertension (11/10/2010), Essential hypertension (11/10/2010), Gastro-esophageal reflux disease without esophagitis (11/10/2010), GERD (11/10/2010), Hair loss (09/17/2019), Heart failure (HCC) (04/20/2021), Heart murmur, Hepatitis C, History of cervical cancer (03/22/2011), History of gout (X 1), History of tobacco use (04/20/2021),  atrial flutter (03/15/2021), Hyperglycemia due to type 2 diabetes mellitus (HCC) (04/20/2021), Hyperlipidemia, Hypertension, Hypertension associated with diabetes (HCC) (08/25/2018), Irritable bowel syndrome with constipation (04/20/2021), Irritable bowel syndrome with diarrhea (06/18/2019), Limitation of activity due to disability (09/18/2021), Lymphedema of both lower extremities (03/22/2011), Metabolic alkalosis (03/28/2021), Microalbuminuria (04/20/2021), Migraine, Mild tricuspid regurgitation (04/02/2021), Mitral valve sclerosis (04/02/2021), Mixed hyperlipidemia (04/18/2020), Moderate aortic stenosis (04/20/2021), Mononeuropathy of lower extremity (04/09/2011), Morbid (severe) obesity due to excess calories (HCC) (03/22/2011), Morbid obesity (HCC) (03/22/2011), Morbid obesity with BMI of 50.0-59.9, adult (HCC) (04/02/2021), Neuropathic pain, leg (04/09/2011), Neuropathy due to type 2 diabetes mellitus (HCC)  (02/24/2018), Nocturnal hypoxemia (06/03/2020), Non-smoker (04/02/2021), Obesity hypoventilation syndrome (HCC), Obstructive sleep apnea (11/10/2010), Obstructive sleep apnea (adult) (pediatric) (11/10/2010), On home oxygen therapy, On supplemental oxygen by nasal cannula (04/20/2021), Onychomycosis (06/18/2019), OSA treated with BiPAP, Osteoarthritis (11/28/2010), Paroxysmal atrial fibrillation (HCC) (09/04/2021), Physical debility (05/10/2018), Physical deconditioning (04/24/2021), Pneumonia, Pneumonia due to gram-positive bacteria (08/24/2017), Pressure injury of both heels, unstageable (HCC) (07/03/2021), Pulmonary edema, acute (HCC) (02/21/2022), Renal insufficiency (04/20/2021), Restless leg syndrome (03/22/2021), Restrictive lung disease secondary to obesity (01/28/2022), S/P hysterectomy (02/19/2021), Tinea pedis of both feet (11/12/2018), Upper GI bleed (07/18/2022), Venous insufficiency of both lower extremities (07/02/2021), Weakness (03/20/2021), and Weight loss (03/15/2021).   Surgical History:   Past Surgical History:  Procedure Laterality Date   ABDOMINAL SURGERY  2006 X 2   "TAH incision was infected"   BIOPSY  07/21/2022   Procedure: BIOPSY;  Surgeon: Kathi Der, MD;  Location: [facility];  Service: Gastroenterology;;   CHOLECYSTECTOMY OPEN     ESOPHAGOGASTRODUODENOSCOPY N/A 07/21/2022   Procedure: ESOPHAGOGASTRODUODENOSCOPY (EGD);  Surgeon: Kathi Der, MD;  Location: [facility];  Service: Gastroenterology;  Laterality: N/A;   TONSILLECTOMY     TOTAL ABDOMINAL HYSTERECTOMY  2006   TRACHEOSTOMY REVISION N/A 04/09/2022   Procedure: CONTROL OF BLEEDING,TRACHEOSTOMY SITE;  Surgeon: Christia Reading, MD;  Location: [facility] OR;  Service: ENT;  Laterality: N/A;     Social History:   reports that she has never smoked. She has never used smokeless tobacco. She reports that she does not currently use alcohol. She reports that she does not use drugs.   Family History:  Her  family history includes Heart disease in her father.   Allergies Allergies  Allergen Reactions   Iodinated Contrast Media Anaphylaxis   Penicillins Anaphylaxis    Patient tolerates cefepime (08/2017)   Insulin Lispro Other (See Comments)    Adder per MAR from SNF   Minoxidil Hives     Home Medications  Prior to Admission medications   Medication Sig Start Date End Date Taking? Authorizing Provider  albuterol (PROVENTIL) (2.5 MG/3ML) 0.083% nebulizer solution Take 3 mLs (2.5 mg total) by nebulization every 6 (six) hours as needed for wheezing or shortness of breath (I50.32). 07/04/16  Yes Oretha Milch, MD  albuterol (VENTOLIN HFA) 108 (90 Base) MCG/ACT inhaler Inhale 2 puffs into the lungs every 4 (four) hours as needed for wheezing or shortness of breath.   Yes [provider]  amiodarone (PACERONE) 200 MG tablet Take 1 tablet (200 mg total) by mouth 2 (two) times daily. Patient taking differently: Take 200 mg by mouth daily. 06/08/23  Yes Alfredo Martinez, MD  atorvastatin (LIPITOR) 20 MG tablet Take 20 mg by mouth every evening.   Yes [provider]  budesonide (PULMICORT) 0.5 MG/2ML nebulizer solution Inhale 0.5 mg into the lungs every 12 (twelve) hours.   Yes [provider]  buPROPion ER [facility]  SR) 100 MG 12 hr tablet Take 1 tablet (100 mg total) by mouth 2 (two) times daily. 06/08/23  Yes Alfredo Martinez, MD  Cholecalciferol 125 MCG (5000 UT) TABS Take 1 tablet (5,000 Units total) by mouth daily. 07/10/22  Yes [name], Werner Lean, MD  ELIQUIS 5 MG TABS tablet Take 5 mg by mouth 2 (two) times daily. 04/12/21  Yes [provider]  empagliflozin (JARDIANCE) 10 MG TABS tablet Take 1 tablet (10 mg total) by mouth daily. 10/03/22  Yes Elgergawy, Leana Roe, MD  ferrous sulfate 325 (65 FE) MG EC tablet Take 325 mg by mouth daily.   Yes [provider]  gabapentin (NEURONTIN) 400 MG capsule Take 400 mg by mouth 3 (three) times daily.   Yes [provider]  hydrOXYzine (ATARAX) 25 MG tablet Take 1 tablet (25 mg total) by mouth in the morning and at bedtime. 06/08/23  Yes Alfredo Martinez, MD  levothyroxine (SYNTHROID) 150 MCG tablet Take 1 tablet (150 mcg total) by mouth daily at 6 (six) AM. Patient taking differently: Take 150 mcg by mouth daily. 07/10/22  Yes [name], Werner Lean, MD  loperamide (IMODIUM) 2 MG capsule Take 2 mg by mouth every 4 (four) hours as needed for diarrhea or loose stools.   Yes [provider]  Multiple Vitamin (MULTIVITAMIN) tablet Take 1 tablet by mouth daily.   Yes [provider]  omeprazole (PRILOSEC) 20 MG capsule Take 20 mg by mouth daily. 10/02/22  Yes [provider]  oxyCODONE-acetaminophen (PERCOCET) 10-325 MG tablet Take 1 tablet by mouth every 6 (six) hours. 06/08/23  Yes Alfredo Martinez, MD  spironolactone (ALDACTONE) 25 MG tablet Take 1 tablet (25 mg total) by mouth daily. 10/17/22  Yes Sharol Harness, Brittainy M, PA-C  torsemide (DEMADEX) 20 MG tablet Take 2 tablets (40 mg total) by mouth 2 (two) times daily. 10/02/22  Yes Elgergawy, Leana Roe, MD  NON FORMULARY Apply BIPAP every night at bedtime.    [provider]  OXYGEN Inhale 5 L/min into the lungs in the morning and at bedtime. Connect to BIPAP qhs    [provider]     Critical care time:  NA  Stephie Xu D. Harris, NP-C [facility] Personal contact information can be found on Amion  If no contact or response made please call [phone] 06/16/2023, 6:08 PM

## 2023-06-16 NOTE — Assessment & Plan Note (Signed)
-   Cont home pulmicort - Cont home  -Target O2 sats 88-92

## 2023-06-16 NOTE — Assessment & Plan Note (Signed)
Afeb ON. Bcx NGTD x1day.  - Cont Cefepime. Consider de-escalating to CTX or PO if continues to improve tomorrow. - Cont Vanc. MRSA swab positive. - Cont Bipap while sleeping or napping

## 2023-06-16 NOTE — Assessment & Plan Note (Signed)
Low to 3.0. Replete K PO. - AM BMP, Mg, CBC

## 2023-06-16 NOTE — Progress Notes (Signed)
FMTS Brief Progress Note  S: Sleeping in bed with BiPAP mask on.  Husband at bedside.   O: BP 112/77 (BP Location: Left Wrist)   Pulse 73   Temp 98.8 F (37.1 C) (Oral)   Resp 14   Ht 5\' 5"  (1.651 m)   Wt (!) 148.5 kg   SpO2 94%   BMI 54.48 kg/m   Gen: Chronically ill-appearing.  On BiPAP and sleeping. CV: Regular rate and rhythm however heart sounds are distant Resp: Diminished breath sounds bilaterally secondary to body habitus, exam limited by body habitus, saturating appropriately on BiPAP Neuro: Arouses to voice, can state her name and where she is, remembers me from her last visit  A/P: Acute respiratory failure with hypoxia and hypercarbia -VBG prior to signout to night team with pH of 7.45, pCO2 of 77 -Gave Diamox 500 mg due to bicarb of 41 on CMP per CCM recommendations -Continuous bipap -Monitoring for change in mental status -BLE venous duplex pending   Multifocal pneumonia - continue cefepime, vanc - MRSA swab +, RPP negative - Blood cx NG x2 days - Procal <0.10 - continuous bipap - pulmicort neb BID - albuterol neb q6h PRN  CHF - strict I&Os, daily weights - lasix 80mg  BID - home spironolactone 25mg , holding home torsemide  - Orders reviewed. Labs for AM ordered (CBC, BMP), which was adjusted as needed.  - If condition changes, plan includes CCM consult for change in mental status or worsening respiratory status  Chelsea Shuford, DO 06/16/2023, 11:09 PM PGY-1, [facility] Night Resident  Please page [phone] with questions.

## 2023-06-16 NOTE — Assessment & Plan Note (Signed)
Glucose appropriate - Jardiance 10mg  daily - resistant SSI

## 2023-06-16 NOTE — Progress Notes (Signed)
   06/16/23 2026  BiPAP/CPAP/SIPAP  BiPAP/CPAP/SIPAP Pt Type Adult  BiPAP/CPAP/SIPAP Resmed  Mask Type Full face mask  Mask Size Medium  Respiratory Rate 14 breaths/min  IPAP 16 cmH20  EPAP 8 cmH2O  Flow Rate 10 lpm  Patient Home Equipment No  Auto Titrate No  BiPAP/CPAP /SiPAP Vitals  Pulse Rate 73  Resp 14  SpO2 94 %  Bilateral Breath Sounds Diminished  MEWS Score/Color  MEWS Score 0  MEWS Score Color [place] Si

## 2023-06-16 NOTE — Progress Notes (Signed)
Daily Progress Note Intern Pager: [phone]  Patient name: Chelsea [name] Medical record number: [id number] Date of birth: Feb 02, 1966 Age: 57 y.o. Gender: female  Primary Care Provider: Pcp, No Consultants: NONE Code Status: FULL  Pt Overview and Major Events to Date:  9/14 - admitted 9/15 - SOB improved  Assessment and Plan: Chelsea [name] is a 57 y.o. female with a PMHx of HFpEF, aortic valve steonosis, paroxysmal afib/flutter, chronic respiratory failure on continuous O2 (5L home), OSH/OSA and COPD admitted for multifocal PNA.  Assessment & Plan Multifocal pneumonia Afeb ON. Bcx NGTD x1day.  - Cont Cefepime. Consider de-escalating to CTX or PO if continues to improve tomorrow. - Cont Vanc. MRSA swab positive. - Cont Bipap while sleeping or napping DM2 (diabetes mellitus, type 2) (HCC) Glucose appropriate - Jardiance 10mg  daily - resistant SSI Acute respiratory failure with hypoxia and hypercarbia (HCC) - Cont home pulmicort - Cont home  -Target O2 sats 88-92 Hypokalemia Low to 3.0. Replete K PO. - AM BMP, Mg, CBC  Chronic and Stable Problems:  HFpEF - Continue home Spironolactone 25 mg daily and Torsemide 40 mg BID  Paroxsymal a/fib/flutter - continue amiodarone 200 mg BID then 200 mg daily starting 06/22/23. Eliquis 5mg  BID.  COPD - Continue Pulmicort nebulizer BID, goal O2 88-92%  Restless Leg Syndrome - Continue requip 0.25 mg qhs and gabapentin 300 mg TID Chronic IDA - Continue ferous sulfate 325 mg daily Hypothyroidism - Continue Synthroid 150 mcg daily HLD - Continue atorvostatin 20 mg dialy Anxiety - wellbutrin 100mg  bid, atarax 25mg  TID PRN    FEN/GI: Heart healthy and carb modified diet PPx: Cont Home eliquis Dispo:SNF pending clinical improvement . Barriers include continued need for IV antibx.   Subjective:  Pt reports that she feels like her breathing is doing better today compared to admission. No acute complaints this morning. She  reported no n/v/d/c.   Objective: Temp:  [97.7 F (36.5 C)-99.6 F (37.6 C)] 99.6 F (37.6 C) (09/15 0348) Pulse Rate:  [66-81] 78 (09/15 0348) Resp:  [16-20] 20 (09/15 0348) BP: (91-135)/(57-83) 101/57 (09/15 0348) SpO2:  [91 %-97 %] 97 % (09/15 0348) Weight:  [148.5 kg] 148.5 kg (09/15 0556)  Physical Exam: General: Ssleepy, but easily awakens to voice. Obese woman laying comfortably in bed. NAD. Cardiovascular: RRR. 3/6 systolic murmur best heard along L sternal border. Heart sounds diminished. Bradycardia Respiratory: CTAB, no wheezing or crackles.  Receiving nebulizer treatment.  Abdomen: Soft, nontender, nondistended.  Normal BS. Extremities: Pt still has scabbed wounds on her shins.  Laboratory: Most recent CBC Lab Results  Component Value Date   WBC 7.3 06/16/2023   HGB 11.8 (L) 06/16/2023   HCT 38.7 06/16/2023   MCV 98.7 06/16/2023   PLT 198 06/16/2023   Most recent BMP    Latest Ref Rng & Units 06/16/2023    4:29 AM  BMP  Glucose 70 - 99 mg/dL 147   BUN 6 - 20 mg/dL 18   Creatinine 8.29 - 1.00 mg/dL 5.62   Sodium 130 - 865 mmol/L 140   Potassium 3.5 - 5.1 mmol/L 3.3   Chloride 98 - 111 mmol/L 90   CO2 22 - 32 mmol/L 35   Calcium 8.9 - 10.3 mg/dL 8.6    Margaretmary Dys, MD 06/16/2023, 7:35 AM  PGY-1, [facility] FPTS Intern pager: [phone], text pages welcome Secure chat group [facility] Teaching Service

## 2023-06-17 ENCOUNTER — Inpatient Hospital Stay (HOSPITAL_COMMUNITY): Payer: Medicare Other

## 2023-06-17 ENCOUNTER — Encounter (HOSPITAL_COMMUNITY): Payer: Self-pay | Admitting: Student

## 2023-06-17 DIAGNOSIS — R0689 Other abnormalities of breathing: Secondary | ICD-10-CM | POA: Diagnosis present

## 2023-06-17 DIAGNOSIS — J9621 Acute and chronic respiratory failure with hypoxia: Secondary | ICD-10-CM

## 2023-06-17 DIAGNOSIS — R609 Edema, unspecified: Secondary | ICD-10-CM | POA: Diagnosis not present

## 2023-06-17 DIAGNOSIS — J9622 Acute and chronic respiratory failure with hypercapnia: Secondary | ICD-10-CM | POA: Diagnosis not present

## 2023-06-17 DIAGNOSIS — J189 Pneumonia, unspecified organism: Secondary | ICD-10-CM | POA: Diagnosis not present

## 2023-06-17 LAB — CBC
HCT: 37 % (ref 36.0–46.0)
Hemoglobin: 11.3 g/dL — ABNORMAL LOW (ref 12.0–15.0)
MCH: 29.3 pg (ref 26.0–34.0)
MCHC: 30.5 g/dL (ref 30.0–36.0)
MCV: 95.9 fL (ref 80.0–100.0)
Platelets: 215 10*3/uL (ref 150–400)
RBC: 3.86 MIL/uL — ABNORMAL LOW (ref 3.87–5.11)
RDW: 15.3 % (ref 11.5–15.5)
WBC: 6.3 10*3/uL (ref 4.0–10.5)
nRBC: 0 % (ref 0.0–0.2)

## 2023-06-17 LAB — BASIC METABOLIC PANEL
Anion gap: 10 (ref 5–15)
BUN: 16 mg/dL (ref 6–20)
CO2: 41 mmol/L — ABNORMAL HIGH (ref 22–32)
Calcium: 8.9 mg/dL (ref 8.9–10.3)
Chloride: 90 mmol/L — ABNORMAL LOW (ref 98–111)
Creatinine, Ser: 0.76 mg/dL (ref 0.44–1.00)
GFR, Estimated: >60 mL/min (ref >60)
Glucose, Bld: 184 mg/dL — ABNORMAL HIGH (ref 70–99)
Potassium: 3.5 mmol/L (ref 3.5–5.1)
Sodium: 141 mmol/L (ref 135–145)

## 2023-06-17 LAB — GLUCOSE, CAPILLARY
Glucose-Capillary: 169 mg/dL — ABNORMAL HIGH (ref 70–99)
Glucose-Capillary: 157 mg/dL — ABNORMAL HIGH (ref 70–99)
Glucose-Capillary: 128 mg/dL — ABNORMAL HIGH (ref 70–99)
Glucose-Capillary: 198 mg/dL — ABNORMAL HIGH (ref 70–99)

## 2023-06-17 MED ORDER — PANTOPRAZOLE SODIUM 40 MG PO TBEC
40.0000 mg | DELAYED_RELEASE_TABLET | Freq: Every day | ORAL | Status: DC
  Administered 2023-06-17 – 2023-06-24 (×8): 40 mg via ORAL
  Filled 2023-06-17 (×8): qty 1

## 2023-06-17 NOTE — Progress Notes (Signed)
Lower extremity venous duplex completed. Please see CV Procedures for preliminary results.  Shona Simpson, RVT 06/17/23 3:24 PM

## 2023-06-17 NOTE — Progress Notes (Signed)
NAME:  Chelsea [name], MRN:  [id number], DOB:  1965/12/25, LOS: 3 ADMISSION DATE:  06/14/2023, CONSULTATION DATE: 06/16/2023 REFERRING MD: Danise Edge, CHIEF COMPLAINT: Hypercapnic respiratory failure  History of Present Illness:  57 yo female presented with AMS, dypnea.  Found to have acute on chronic hypercapnia in setting of multifocal pneumonia.  Previously followed by Dr. Vassie Loll for OSA, OHS, and chronic bronchitis.    Pertinent  Medical History  CHF with CAD, Chronic bronchitis, OSA/OSH, chronic hypoxic and hypercapnic respiratory failure, A-fib, HLD, HTN, diabetes, and chronic back pain  Significant Hospital Events: Including procedures, antibiotic start and stop dates in addition to other pertinent events   9/13 Admit, start Bipap and Abx  Interim History / Subjective:  Feels better, but still feels like her head is cloudy and she is jittery.  Wants something to eat.  Objective   Blood pressure 102/79, pulse 66, temperature 99.1 F (37.3 C), temperature source Oral, resp. rate 19, height 5\' 5"  (1.651 m), weight (!) 148.5 kg, SpO2 100%.        Intake/Output Summary (Last 24 hours) at 06/17/2023 1444 Last data filed at 06/17/2023 1136 Gross per 24 hour  Intake 446.99 ml  Output 3500 ml  Net -3053.01 ml   Filed Weights   06/14/23 1438 06/16/23 0556  Weight: (!) 154.2 kg (!) 148.5 kg    Examination:  General - alert Eyes - pupils reactive ENT - no sinus tenderness, no stridor Cardiac - regular rate/rhythm, no murmur Chest - equal breath sounds b/l, no wheezing or rales Abdomen - soft, non tender, + bowel sounds Extremities - 2+ edema Skin - venous stasis changes Neuro - normal strength, moves extremities, follows commands Psych - normal mood and behavior  Resolved Hospital Problem list     Assessment & Plan:   Acute on chronic hypoxic, hypercapnic respiratory failure. - from multifocal pneumonia with hx of OSA/OHS and chronic bronchitis - PSG 06/02/20 >> AHI 17.7,  SpO2 low 58% Plan: - goal SpO2 88 to 95% - Bipap at bedtime and prn  - continue BDs - bronchial hygiene - day 5 of ABx  Acute on chronic HFpEF. PAF. Aortic stenosis. - Echo 06/01/23 >> EF 60 to 65%, mild/mod LA dilation, mod AS Plan: - continue amiodarone, eliquis, lasix   Labs       Latest Ref Rng & Units 06/17/2023    4:00 AM 06/16/2023    6:27 PM 06/16/2023    4:29 AM  CMP  Glucose 70 - 99 mg/dL 409  811  914   BUN 6 - 20 mg/dL 16  15  18    Creatinine 0.44 - 1.00 mg/dL 7.82  9.56  2.13   Sodium 135 - 145 mmol/L 141  142  140   Potassium 3.5 - 5.1 mmol/L 3.5  3.5  3.3   Chloride 98 - 111 mmol/L 90  88  90   CO2 22 - 32 mmol/L 41  41  35   Calcium 8.9 - 10.3 mg/dL 8.9  9.0  8.6   Total Protein 6.5 - 8.1 g/dL  7.1    Total Bilirubin 0.3 - 1.2 mg/dL  0.8    Alkaline Phos 38 - 126 U/L  80    AST 15 - 41 U/L  12    ALT 0 - 44 U/L  11        Latest Ref Rng & Units 06/17/2023    4:00 AM 06/16/2023    4:29 AM 06/15/2023  3:53 AM  CBC  WBC 4.0 - 10.5 K/uL 6.3  7.3  7.7   Hemoglobin 12.0 - 15.0 g/dL 41.3  24.4  01.0   Hematocrit 36.0 - 46.0 % 37.0  38.7  35.3   Platelets 150 - 400 K/uL 215  198  200     ABG    Component Value Date/Time   PHART 7.384 04/09/2022 1932   PCO2ART 46.6 04/09/2022 1932   PO2ART 106 04/09/2022 1932   HCO3 53.0 (H) 06/16/2023 1605   TCO2 44 (H) 06/14/2023 0338   TCO2 37 (H) 06/14/2023 0338   O2SAT 66.4 06/16/2023 1605    CBG (last 3)  Recent Labs    06/16/23 2120 06/17/23 0611 06/17/23 1140  GLUCAP 134* 169* 157*    Signature:  Coralyn Helling, MD [facility] Pager - [phone] or [phone] 06/17/2023, 2:45 PM

## 2023-06-17 NOTE — Assessment & Plan Note (Signed)
Patient hypercarbic yesterday evening with venous pCO2 of 77, s/p 1 dose of Demox with improvement to CO2 41. Patient appears around baseline mentation.  - AM BMP - CTM for changes in mentation

## 2023-06-17 NOTE — Assessment & Plan Note (Addendum)
Glucose appropriate - continue Jardiance 10mg  daily - CBG QID+ SSI

## 2023-06-17 NOTE — Assessment & Plan Note (Signed)
Patient with chronic bilateral LE lymphedema.  - Per CCM rec, bilateral LE Doppler US today

## 2023-06-17 NOTE — Progress Notes (Signed)
Daily Progress Note Intern Pager: [phone]  Patient name: Chelsea [name] Medical record number: [id number] Date of birth: 23-Apr-1966 Age: 57 y.o. Gender: female  Primary Care Provider: Pcp, No Consultants: Critical Care Code Status: Full  Pt Overview and Major Events to Date:  9/14: Admitted to FMTS  Assessment and Plan:  Chelsea [name] is a 57 y.o. female with pmh of HFpEF, CAD, chronic respiratory failure on continuous O2 (5L home), OSH/OSA, and COPD admitted for multifocal PNA, receiving IV cefepime and vancomycin.  Assessment & Plan Multifocal pneumonia Patient afebrile overnight. No leukocytosis. Blood cx NG3d. MRSA+ nasal swab.  - Continue Cefepime and Vanc - Continue home pulmicort BID, albuterol q6 PRN - Continue home O2 and bipap while sleeping or napping Hypercarbia Patient hypercarbic yesterday evening with venous pCO2 of 77, s/p 1 dose of Demox with improvement to CO2 41. Patient appears around baseline mentation.  - AM BMP - CTM for changes in mentation DM2 (diabetes mellitus, type 2) (HCC) Glucose appropriate - continue Jardiance 10mg  daily - CBG QID+ SSI Hypokalemia Hypokalemic at admission now s/p K PO. K 3.5 this morning.  - AM BMP, Mg, CBC - replete electrolytes as needed Lymphedema Patient with chronic bilateral LE lymphedema.  - Per CCM rec, bilateral LE Doppler US today  Chronic and Stable Issues: HFpEF - Continue home Spironolactone 25 mg daily, lasix 80 BID in place of home torsemide 40 BID  Paroxsymal a/fib/flutter - continue amiodarone 200 mg BID then 200 mg daily starting 06/22/23. Eliquis 5mg  BID.  COPD - Continue Pulmicort nebulizer BID, goal O2 88-92%  Restless Leg Syndrome - Continue requip 0.25 mg qhs and gabapentin 400 mg TID Chronic IDA - Continue ferous sulfate 325 mg daily Hypothyroidism - Continue Synthroid 150 mcg daily HLD - Continue atorvostatin 20 mg dialy Anxiety - Continue wellbutrin 100mg  bid, atarax 25mg   TID PRN   FEN/GI: NPO, sips with meds while on BIPAP PPx: Cont home eliquis 5 mg Dispo: SNF pending clinical improvement   Subjective:  Patient was feeling warm this morning and removed as many layers as possible. She feels her breathing has not changed much, no sharp chest pain.   Objective: Temp:  [98 F (36.7 C)-98.9 F (37.2 C)] 98.5 F (36.9 C) (09/16 0839) Pulse Rate:  [62-80] 62 (09/16 0305) Resp:  [12-20] 19 (09/16 0839) BP: (98-115)/(64-77) 101/68 (09/16 0839) SpO2:  [92 %-99 %] 95 % (09/16 0929) Physical Exam: General: Resting in bed, somewhat jittery Cardiovascular: Loud systolic murmur. No additional heart sounds. Warm and well-perfused overall.  Respiratory: Bipap in place. Diminished breath sounds bilaterally secondary to body habitus. No increased WOB. Abdomen: Soft, non-tender, non-distended. Extremities: Warm, dry. Chronic LE lymphedema.   Laboratory: Most recent CBC Lab Results  Component Value Date   WBC 6.3 06/17/2023   HGB 11.3 (L) 06/17/2023   HCT 37.0 06/17/2023   MCV 95.9 06/17/2023   PLT 215 06/17/2023   Most recent BMP    Latest Ref Rng & Units 06/17/2023    4:00 AM  BMP  Glucose 70 - 99 mg/dL 469   BUN 6 - 20 mg/dL 16   Creatinine 6.29 - 1.00 mg/dL 5.28   Sodium 413 - 244 mmol/L 141   Potassium 3.5 - 5.1 mmol/L 3.5   Chloride 98 - 111 mmol/L 90   CO2 22 - 32 mmol/L 41   Calcium 8.9 - 10.3 mg/dL 8.9     Imaging/Diagnostic Tests: CXR (9/15): Findings suggest CHF. CTChest (  9/13): Multifocal pneumonia CTHead (9/13): No acute findings  Chelsea Quale, MD 06/17/2023, 9:53 AM  PGY-1, [facility] FPTS Intern pager: [phone], text pages welcome Secure chat group [facility] Teaching Service

## 2023-06-17 NOTE — Progress Notes (Signed)
FMTS Brief Progress Note  S: Evaluated patient with Dr. Royal Piedra.  Resting comfortably in bed.  Saturating around 97% on 10 L.  Notes that she has had twitching in her arms and legs, consistent with her previous restless leg twitching.  No other complaints at this time.  States she is feeling much better.   O: BP 92/60 (BP Location: Left Wrist)   Pulse 71   Temp 97.7 F (36.5 C) (Oral)   Resp (!) 21   Ht 5\' 5"  (1.651 m)   Wt (!) 148.5 kg   SpO2 97%   BMI 54.48 kg/m   Gen: Chronically ill-appearing. Resting comfortably, watching TV, on 10L.  CV: Distant heart sounds, regular rate and systolic mumur noted consistent with my exam from her last admission Resp: Diminished breath sounds bilaterally secondary to body habitus, exam limited by body habitus, saturating around 97% on 10L. Speaking in complete sentences. No respiratory distress.  Neuro: A&Ox3. No focal neurological deficit.   A/P: Acute respiratory failure with hypoxia and hypercarbia -bicarb 41 on CMP this morning s/p diamox 500mg , repeat ordered for am -bipap while sleeping -Monitoring for change in mental status -BLE venous duplex negative for dvt  -wean o2 as tolerated   Multifocal pneumonia - continue cefepime, vanc - MRSA swab +, RPP negative - Blood cx NG x3 days - bipap while sleeping - pulmicort neb BID - albuterol neb q6h PRN   CHF - strict I&Os, daily weights - continue lasix 80mg  BID - home spironolactone 25mg , holding home torsemide  - Orders reviewed. Labs for AM ordered, which was adjusted as needed.  - If condition changes, plan includes CCM consult for change in mental status or worsening respiratory status .   Chelsea Desch, DO 06/17/2023, 8:32 PM PGY-1, [facility] Night Resident  Please page [phone] with questions.

## 2023-06-17 NOTE — Progress Notes (Signed)
Pt taken off of bipap at this time per pt request. Pt placed on 10L HFNC without complication. RN made aware. RT will continue to monitor and be available as needed.

## 2023-06-17 NOTE — Assessment & Plan Note (Addendum)
Patient afebrile overnight. No leukocytosis. Blood cx NG3d. MRSA+ nasal swab.  - Continue Cefepime and Vanc - Continue home pulmicort BID, albuterol q6 PRN - Continue home O2 and bipap while sleeping or napping

## 2023-06-17 NOTE — Assessment & Plan Note (Addendum)
Hypokalemic at admission now s/p K PO. K 3.5 this morning.  - AM BMP, Mg, CBC - replete electrolytes as needed

## 2023-06-17 NOTE — Assessment & Plan Note (Deleted)
-   Cont home pulmicort - Cont home  -Target O2 sats 88-92

## 2023-06-17 NOTE — TOC Progression Note (Signed)
Transition of Care [facility]) - Progression Note    Patient Details  Name: Chelsea [name] MRN: [id number] Date of Birth: 03/18/1966  Transition of [facility] CM/SW Contact  Eduard Roux, [place] Phone Number: 06/17/2023, 3:23 PM  Clinical Narrative:      Patient is from [facility] (LTC) [facility] is following and will assist with discharge planning once stable.  Antony Blackbird, MSW, LCSW Clinical Social Worker     Barriers to Discharge: Continued Medical Work up  Expected Discharge Plan and Services                                               Social Determinants of Health (SDOH) Interventions SDOH Screenings   Food Insecurity: No Food Insecurity (06/06/2023)  Housing: Medium Risk (06/06/2023)  Transportation Needs: No Transportation Needs (06/06/2023)  Utilities: At Risk (06/06/2023)  Alcohol Screen: Low Risk  (07/05/2022)  Financial Resource Strain: Low Risk  (07/05/2022)  Physical Activity: Insufficiently Active (03/27/2021)   Received from [facility], [facility]  Social Connections: Unknown (02/04/2022)   Received from [facility], [facility]  Stress: No Stress Concern Present (09/15/2021)   Received from [facility], [facility]  Tobacco Use: Low Risk  (06/14/2023)    Readmission Risk Interventions    07/23/2022    2:19 PM 04/09/2022    4:17 PM  Readmission Risk Prevention Plan  Transportation Screening Complete Complete  PCP or Specialist Appt within 3-5 Days  Complete  HRI or Home Care Consult  Complete  Social Work Consult for Recovery Care Planning/Counseling  Complete  Palliative Care Screening  Not Applicable  Medication Review [employer]) Complete Referral to Pharmacy  PCP or Specialist appointment within 3-5 days of discharge Complete   HRI or Home Care Consult Complete   SW Recovery Care/Counseling Consult Complete   Palliative Care Screening Not Applicable   Skilled Nursing Facility Complete

## 2023-06-18 ENCOUNTER — Encounter (HOSPITAL_COMMUNITY): Payer: Self-pay | Admitting: Student

## 2023-06-18 DIAGNOSIS — J9601 Acute respiratory failure with hypoxia: Secondary | ICD-10-CM | POA: Diagnosis not present

## 2023-06-18 DIAGNOSIS — J9602 Acute respiratory failure with hypercapnia: Secondary | ICD-10-CM | POA: Diagnosis not present

## 2023-06-18 DIAGNOSIS — J189 Pneumonia, unspecified organism: Secondary | ICD-10-CM | POA: Diagnosis not present

## 2023-06-18 LAB — BASIC METABOLIC PANEL
Anion gap: 9 (ref 5–15)
Anion gap: 11 (ref 5–15)
BUN: 19 mg/dL (ref 6–20)
BUN: 21 mg/dL — ABNORMAL HIGH (ref 6–20)
CO2: 38 mmol/L — ABNORMAL HIGH (ref 22–32)
CO2: 37 mmol/L — ABNORMAL HIGH (ref 22–32)
Calcium: 8.4 mg/dL — ABNORMAL LOW (ref 8.9–10.3)
Calcium: 8.5 mg/dL — ABNORMAL LOW (ref 8.9–10.3)
Chloride: 89 mmol/L — ABNORMAL LOW (ref 98–111)
Chloride: 88 mmol/L — ABNORMAL LOW (ref 98–111)
Creatinine, Ser: 0.95 mg/dL (ref 0.44–1.00)
Creatinine, Ser: 1.08 mg/dL — ABNORMAL HIGH (ref 0.44–1.00)
GFR, Estimated: >60 mL/min (ref >60)
GFR, Estimated: 59 mL/min — ABNORMAL LOW (ref >60)
Glucose, Bld: 202 mg/dL — ABNORMAL HIGH (ref 70–99)
Glucose, Bld: 221 mg/dL — ABNORMAL HIGH (ref 70–99)
Potassium: 2.7 mmol/L — CL (ref 3.5–5.1)
Potassium: 3.5 mmol/L (ref 3.5–5.1)
Sodium: 136 mmol/L (ref 135–145)
Sodium: 136 mmol/L (ref 135–145)

## 2023-06-18 LAB — CBC
HCT: 36.3 % (ref 36.0–46.0)
Hemoglobin: 11.1 g/dL — ABNORMAL LOW (ref 12.0–15.0)
MCH: 29.3 pg (ref 26.0–34.0)
MCHC: 30.6 g/dL (ref 30.0–36.0)
MCV: 95.8 fL (ref 80.0–100.0)
Platelets: 209 10*3/uL (ref 150–400)
RBC: 3.79 MIL/uL — ABNORMAL LOW (ref 3.87–5.11)
RDW: 15.3 % (ref 11.5–15.5)
WBC: 6.8 10*3/uL (ref 4.0–10.5)
nRBC: 0 % (ref 0.0–0.2)

## 2023-06-18 LAB — GLUCOSE, CAPILLARY
Glucose-Capillary: 225 mg/dL — ABNORMAL HIGH (ref 70–99)
Glucose-Capillary: 228 mg/dL — ABNORMAL HIGH (ref 70–99)
Glucose-Capillary: 197 mg/dL — ABNORMAL HIGH (ref 70–99)
Glucose-Capillary: 203 mg/dL — ABNORMAL HIGH (ref 70–99)

## 2023-06-18 LAB — MAGNESIUM: Magnesium: 2.3 mg/dL (ref 1.7–2.4)

## 2023-06-18 MED ORDER — POTASSIUM CHLORIDE 10 MEQ/100ML IV SOLN
10.0000 meq | Freq: Once | INTRAVENOUS | Status: AC
  Administered 2023-06-18: 10 meq via INTRAVENOUS
  Filled 2023-06-18: qty 100

## 2023-06-18 MED ORDER — POTASSIUM CHLORIDE 20 MEQ PO PACK: 60.0000 meq | PACK | Freq: Once | ORAL | Status: AC

## 2023-06-18 MED ORDER — POTASSIUM CHLORIDE CRYS ER 20 MEQ PO TBCR
60.0000 meq | EXTENDED_RELEASE_TABLET | Freq: Once | ORAL | Status: AC
  Administered 2023-06-18: 60 meq via ORAL
  Filled 2023-06-18: qty 3

## 2023-06-18 MED ORDER — ROPINIROLE HCL 0.5 MG PO TABS
0.2500 mg | ORAL_TABLET | Freq: Once | ORAL | Status: AC
  Administered 2023-06-18: 0.25 mg via ORAL
  Filled 2023-06-18 (×2): qty 1

## 2023-06-18 MED ORDER — POTASSIUM CHLORIDE CRYS ER 20 MEQ PO TBCR
40.0000 meq | EXTENDED_RELEASE_TABLET | Freq: Once | ORAL | Status: AC
  Administered 2023-06-18: 40 meq via ORAL
  Filled 2023-06-18: qty 2

## 2023-06-18 NOTE — Assessment & Plan Note (Signed)
Hypokalemic at admission, had 40 meq earlier in admission. K 2.7 this morning.  - AM BMP, Mg, CBC - replete electrolytes as needed - repleted K with 60 meq

## 2023-06-18 NOTE — Plan of Care (Signed)
  Problem: Coping: Goal: Ability to adjust to condition or change in health will improve Outcome: Progressing   Problem: Nutritional: Goal: Maintenance of adequate nutrition will improve Outcome: Progressing   Problem: Clinical Measurements: Goal: Cardiovascular complication will be avoided Outcome: Progressing

## 2023-06-18 NOTE — Plan of Care (Signed)

## 2023-06-18 NOTE — Assessment & Plan Note (Signed)
Patient afebrile overnight. No leukocytosis. Blood cx NG3d. MRSA+ nasal swab.  - Continue Cefepime and Vanc - Continue home pulmicort BID, albuterol q6 PRN - Continue home O2 and bipap while sleeping or napping

## 2023-06-18 NOTE — Assessment & Plan Note (Signed)
Patient with chronic bilateral LE lymphedema.  - Per CCM rec, bilateral LE Doppler US today

## 2023-06-18 NOTE — Assessment & Plan Note (Signed)
Patient hypercarbic yesterday evening with venous pCO2 of 77, s/p 1 dose of Demox with improvement to CO2 41. Patient appears around baseline mentation.  - AM BMP - CTM for changes in mentation

## 2023-06-18 NOTE — Progress Notes (Signed)
   06/18/23 2247  BiPAP/CPAP/SIPAP  $ Non-Invasive Ventilator  Non-Invasive Vent Subsequent  BiPAP/CPAP/SIPAP Pt Type Adult  BiPAP/CPAP/SIPAP Resmed  Mask Type Full face mask  Mask Size Medium  IPAP 16 cmH20  EPAP 8 cmH2O  Flow Rate 5 lpm  Patient Home Equipment No  Nasal massage performed Yes  CPAP/SIPAP surface wiped down Yes

## 2023-06-18 NOTE — Progress Notes (Signed)
Pharmacy Antibiotic Note Chelsea [name] is a 57 y.o. female admitted on 06/14/2023 with pneumonia.  Patient received cefepime 2g x1 and vancomycin 2g x1 in the ED. Pharmacy has been consulted for vancomycin and cefepime dosing. CT scan showing multifocal pneumonia. Treating for HAP.  D5 of antibiotics. Afebrile with no leukocytosis. Slight bump in serum creatinine to 0.95. Current plan is to continue antibiotics for a total of 7 days duration.  Plan: Continue vancomycin 1000mg  IV Q12H (Scr 0.95, eAUC 517.8), Goal AUC 400-550 Obtain levels as indicated Continue cefepime 2g IV Q8H F/u cultures, renal function, and clinical progression  Temp (24hrs), Avg:98.4 F (36.9 C), Min:97.6 F (36.4 C), Max:99.7 F (37.6 C)  Recent Labs  Lab 06/14/23 0235 06/14/23 0338 06/14/23 0339 06/14/23 0455 06/15/23 0353 06/16/23 0429 06/16/23 1827 06/17/23 0400 06/18/23 0909  WBC 8.6  --   --   --  7.7 7.3  --  6.3 6.8  CREATININE 1.13*   < >  --   --  0.86 0.78 0.73 0.76 0.95  LATICACIDVEN  --   --  2.4* 1.5  --   --  0.8  --   --    < > = values in this interval not displayed.    Estimated Creatinine Clearance: 94.2 mL/min (by C-G formula based on SCr of 0.95 mg/dL).    Allergies  Allergen Reactions   Iodinated Contrast Media Anaphylaxis   Penicillins Anaphylaxis    Patient tolerates cefepime (08/2017)   Insulin Lispro Other (See Comments)    Adder per [facility] from SNF   Minoxidil Hives   Antimicrobials this admission: 9/13 Vancomycin >>  9/13 Cefepime >>   Dose adjustments: Vanco 1500 q24h > 1000 q12h  Microbiology results: 9/13 BCx: no growth to date x4 MRSA PCR (+)  Thank you for allowing pharmacy to be a part of this patient's care.  Nicole Kindred, PharmD PGY1 Pharmacy Resident 06/18/2023 12:49 PM

## 2023-06-18 NOTE — Assessment & Plan Note (Signed)
Glucose appropriate - continue Jardiance 10mg  daily - CBG QID+ SSI

## 2023-06-18 NOTE — Progress Notes (Signed)
NAME:  Chelsea [name], MRN:  [id number], DOB:  03/22/1966, LOS: 4 ADMISSION DATE:  06/14/2023, CONSULTATION DATE: 06/16/2023 REFERRING MD: Danise Edge, CHIEF COMPLAINT: Hypercapnic respiratory failure  History of Present Illness:  57 yo female presented with AMS, dypnea.  Found to have acute on chronic hypercapnia in setting of multifocal pneumonia.  Previously followed by Dr. Vassie Loll for OSA, OHS, and chronic bronchitis.    Pertinent  Medical History  CHF with CAD, Chronic bronchitis, OSA/OSH, chronic hypoxic and hypercapnic respiratory failure, A-fib, HLD, HTN, diabetes, and chronic back pain  Significant Hospital Events: Including procedures, antibiotic start and stop dates in addition to other pertinent events   9/13 Admit, start Bipap and Abx  Interim History / Subjective:  Continues to improve but is unhappy with her progress at this time.  Objective   Blood pressure 96/61, pulse 61, temperature 99 F (37.2 C), temperature source Oral, resp. rate 20, height 5\' 5"  (1.651 m), weight (!) 148.5 kg, SpO2 93%.        Intake/Output Summary (Last 24 hours) at 06/18/2023 1057 Last data filed at 06/18/2023 0335 Gross per 24 hour  Intake 1301.99 ml  Output 1700 ml  Net -398.01 ml   Filed Weights   06/14/23 1438 06/16/23 0556 06/18/23 0607  Weight: (!) 154.2 kg (!) 148.5 kg (!) 148.5 kg    Examination: Morbid obese female no acute distress No JVD lymphadenopathy is appreciated Decreased breath sounds throughout Heart sounds are distant Abdomen soft nontender positive bowel sounds obese Lower extremities with 2+ edema Neurologically intact Somewhat depressed      Resolved Hospital Problem list     Assessment & Plan:   Acute on chronic hypoxic, hypercapnic respiratory failure. - from multifocal pneumonia with hx of OSA/OHS and chronic bronchitis - PSG 06/02/20 >> AHI 17.7, SpO2 low 58% Plan: Keep O2 sats 88 to 94% Turned on as needed BiPAP Bronchodilators Out of bed as  tolerated Complete antimicrobial therapy     Acute on chronic HFpEF. PAF. Aortic stenosis. Echo 06/01/23 >> EF 60 to 65%, mild/mod LA dilation, mod AS No interventions at this time Plan: Continue amiodarone Eliquis and Lasix   Labs       Latest Ref Rng & Units 06/17/2023    4:00 AM 06/16/2023    6:27 PM 06/16/2023    4:29 AM  CMP  Glucose 70 - 99 mg/dL 562  130  865   BUN 6 - 20 mg/dL 16  15  18    Creatinine 0.44 - 1.00 mg/dL 7.84  6.96  2.95   Sodium 135 - 145 mmol/L 141  142  140   Potassium 3.5 - 5.1 mmol/L 3.5  3.5  3.3   Chloride 98 - 111 mmol/L 90  88  90   CO2 22 - 32 mmol/L 41  41  35   Calcium 8.9 - 10.3 mg/dL 8.9  9.0  8.6   Total Protein 6.5 - 8.1 g/dL  7.1    Total Bilirubin 0.3 - 1.2 mg/dL  0.8    Alkaline Phos 38 - 126 U/L  80    AST 15 - 41 U/L  12    ALT 0 - 44 U/L  11        Latest Ref Rng & Units 06/18/2023    9:09 AM 06/17/2023    4:00 AM 06/16/2023    4:29 AM  CBC  WBC 4.0 - 10.5 K/uL 6.8  6.3  7.3   Hemoglobin 12.0 - 15.0 g/dL  11.1  11.3  11.8   Hematocrit 36.0 - 46.0 % 36.3  37.0  38.7   Platelets 150 - 400 K/uL 209  215  198     ABG    Component Value Date/Time   PHART 7.384 04/09/2022 1932   PCO2ART 46.6 04/09/2022 1932   PO2ART 106 04/09/2022 1932   HCO3 53.0 (H) 06/16/2023 1605   TCO2 44 (H) 06/14/2023 0338   TCO2 37 (H) 06/14/2023 0338   O2SAT 66.4 06/16/2023 1605    CBG (last 3)  Recent Labs    06/17/23 1655 06/17/23 2120 06/18/23 0605  GLUCAP 128* 198* 225*    Signature:  Coralyn Helling, MD Brett Canales Lonnette Shrode ACNP Acute Care Nurse Practitioner [facility] Please consult Amion 06/18/2023, 10:57 AM  or [phone] 06/18/2023, 10:57 AM

## 2023-06-18 NOTE — Assessment & Plan Note (Signed)
-   Cont home pulmicort - Cont home albuterol  - Target O2 sats 88-92

## 2023-06-18 NOTE — Progress Notes (Addendum)
FMTS Attending Daily Note: Burley Saver, MD  Team Pager [phone] Pager [phone] I have personally seen and examined this patient, reviewed their chart and results. I have discussed this patient with the resident. I agree with the resident's findings, assessment and care plan as documented in the resident's note, clarifications or changes to the resident's note are listed below.  [name] is alert today, oriented, sitting up in bed. She notes she doesn't feel sleepy for the first time today. She feels BiPAP fits well and has been wearing. CO2 down to 38 today after one time dose of acetazolamide. On day 5/7 antibiotics for pneumonia. Continue IV diuresis for one more day, and consider starting PO torsemide 60mg  BID tomorrow, as she had fluid overload when discharged on 40 mg BID last hospitalization. K 2.7 this AM, repeat PM BMP / Mg level ordered to recheck and replete as needed.         Daily Progress Note Intern Pager: [phone]  Patient name: Chelsea [name] Medical record number: [id number] Date of birth: Sep 16, 1966 Age: 57 y.o. Gender: female  Primary Care Provider: Pcp, No Consultants:  Code Status: Full  Pt Overview and Major Events to Date:  9/14 - admitted 9/15 - SOB improved 9/16 - weaned of bipap during day 9/17 - Pt mentation improving. Hypokalemia present, repleted K.   Assessment and Plan: Chelsea [name] is a 57 y.o. female with a PMHx of HFpEF, aortic valve steonosis, paroxysmal afib/flutter, chronic respiratory failure on continuous O2 (5L home), OSH/OSA and COPD admitted for multifocal PNA   Antimicrobial Courses: 9/13 Vancomycin >>  9/13 Cefepime >>  Assessment & Plan Multifocal pneumonia Patient afebrile overnight. No leukocytosis. Blood cx NG3d. MRSA+ nasal swab.  - Continue Cefepime and Vanc - Continue home pulmicort BID, albuterol q6 PRN - Continue home O2 and bipap while sleeping or napping Hypercarbia Patient hypercarbic yesterday  evening with venous pCO2 of 77, s/p 1 dose of Demox with improvement to CO2 41. Patient appears around baseline mentation.  - AM BMP - CTM for changes in mentation Type 2 diabetes mellitus with diabetic polyneuropathy, without long-term current use of insulin (HCC) Glucose appropriate - continue Jardiance 10mg  daily - CBG QID+ SSI Hypokalemia Hypokalemic at admission, had 40 meq earlier in admission. K 2.7 this morning.  - AM BMP, Mg, CBC - replete electrolytes as needed - repleted K with 60 meq Lymphedema Patient with chronic bilateral LE lymphedema.  - Per CCM rec, bilateral LE Doppler US today Acute respiratory failure with hypoxia and hypercarbia (HCC) - Cont home pulmicort - Cont home albuterol  - Target O2 sats 88-92   Chronic and Stable Issues: HFpEF - Continue home Spironolactone 25 mg daily, lasix 80 BID in place of home torsemide 40 BID  Paroxsymal a/fib/flutter - continue amiodarone 200 mg BID then 200 mg daily starting 06/22/23. Eliquis 5mg  BID.  COPD - Continue Pulmicort nebulizer BID, goal O2 88-92%  Restless Leg Syndrome - Continue requip 0.25 mg qhs and gabapentin 400 mg TID Chronic IDA - Continue ferous sulfate 325 mg daily Hypothyroidism - Continue Synthroid 150 mcg daily HLD - Continue atorvostatin 20 mg dialy Anxiety - Continue wellbutrin 100mg  bid, atarax 25mg  TID PRN    FEN/GI: NPO, sips with meds while on BIPAP PPx: Cont home eliquis 5 mg Dispo: SNF pending clinical improvement   Subjective:  Pt was alert and oriented x4 for the first time since I have seen her this admission. She had limited memory of her hospital  course up until this morning. She recalled the difficult interactions with family that led to the team restricting her visitor list. She was appreciative of it. She remains highly anxious and is very sensitive to the transition from CPAP to her breathing treatment to her nasal cannula. She remains above her previous home O2 levels (she was at 2-3L  prior to admission) at 5L.   Objective: Temp:  [97.6 F (36.4 C)-99.7 F (37.6 C)] 99 F (37.2 C) (09/17 0310) Pulse Rate:  [55-74] 55 (09/17 0813) Resp:  [15-21] 15 (09/17 0813) BP: (92-108)/(60-79) 96/61 (09/17 0813) SpO2:  [90 %-100 %] 90 % (09/17 0813) Weight:  [148.5 kg] 148.5 kg (09/17 5188)  Physical Exam: General: Acutely ill, obese woman, anxious in bed. She is disheveled and lying in a hospital gown.  Cardiovascular: holosystolic blowing murmur. Rate is around 60.  Respiratory: On 5L HFNC. Getting breathing treatments. Abdomen: Non-tender to palpation. Cannot judge distension based on patient's body habitus  Extremities: Pt has chronic lymphedema and venous stasis.   Laboratory: Most recent CBC Lab Results  Component Value Date   WBC 6.3 06/17/2023   HGB 11.3 (L) 06/17/2023   HCT 37.0 06/17/2023   MCV 95.9 06/17/2023   PLT 215 06/17/2023   Most recent BMP    Latest Ref Rng & Units 06/17/2023    4:00 AM  BMP  Glucose 70 - 99 mg/dL 416   BUN 6 - 20 mg/dL 16   Creatinine 6.06 - 1.00 mg/dL 3.01   Sodium 601 - 093 mmol/L 141   Potassium 3.5 - 5.1 mmol/L 3.5   Chloride 98 - 111 mmol/L 90   CO2 22 - 32 mmol/L 41   Calcium 8.9 - 10.3 mg/dL 8.9     Other pertinent labs:  Latest Reference Range & Units 06/17/23 04:00 06/18/23 09:09  Potassium 3.5 - 5.1 mmol/L 3.5 2.7 (LL)  (LL): Data is critically low  Imaging/Diagnostic Tests: No interval scans.  Margaretmary Dys, MD 06/18/2023, 8:21 AM  PGY-1, [facility] Family Medicine FPTS Intern pager: [phone], text pages welcome Secure chat group [facility] Teaching Service

## 2023-06-19 ENCOUNTER — Telehealth: Payer: Self-pay | Admitting: Internal Medicine

## 2023-06-19 DIAGNOSIS — J189 Pneumonia, unspecified organism: Secondary | ICD-10-CM | POA: Diagnosis not present

## 2023-06-19 DIAGNOSIS — J9602 Acute respiratory failure with hypercapnia: Secondary | ICD-10-CM | POA: Diagnosis not present

## 2023-06-19 DIAGNOSIS — J9601 Acute respiratory failure with hypoxia: Secondary | ICD-10-CM | POA: Diagnosis not present

## 2023-06-19 LAB — GLUCOSE, CAPILLARY
Glucose-Capillary: 188 mg/dL — ABNORMAL HIGH (ref 70–99)
Glucose-Capillary: 242 mg/dL — ABNORMAL HIGH (ref 70–99)
Glucose-Capillary: 263 mg/dL — ABNORMAL HIGH (ref 70–99)
Glucose-Capillary: 277 mg/dL — ABNORMAL HIGH (ref 70–99)

## 2023-06-19 LAB — CULTURE, BLOOD (ROUTINE X 2)
Culture: NO GROWTH
Culture: NO GROWTH
Special Requests: ADEQUATE
Special Requests: ADEQUATE

## 2023-06-19 MED ORDER — MUPIROCIN 2 % EX OINT
1.0000 | TOPICAL_OINTMENT | Freq: Two times a day (BID) | CUTANEOUS | Status: AC
  Administered 2023-06-19 – 2023-06-23 (×10): 1 via NASAL
  Filled 2023-06-19 (×4): qty 22

## 2023-06-19 MED ORDER — CHLORHEXIDINE GLUCONATE CLOTH 2 % EX PADS
6.0000 | MEDICATED_PAD | Freq: Every day | CUTANEOUS | Status: AC
  Administered 2023-06-19 – 2023-06-23 (×5): 6 via TOPICAL

## 2023-06-19 MED ORDER — TORSEMIDE 20 MG PO TABS
60.0000 mg | ORAL_TABLET | Freq: Two times a day (BID) | ORAL | Status: DC
  Administered 2023-06-19 – 2023-06-21 (×6): 60 mg via ORAL
  Filled 2023-06-19 (×6): qty 3

## 2023-06-19 MED ORDER — MELATONIN 3 MG PO TABS
3.0000 mg | ORAL_TABLET | Freq: Every evening | ORAL | Status: DC | PRN
  Administered 2023-06-19 – 2023-06-24 (×3): 3 mg via ORAL
  Filled 2023-06-19 (×3): qty 1

## 2023-06-19 MED ORDER — POTASSIUM CHLORIDE CRYS ER 20 MEQ PO TBCR
40.0000 meq | EXTENDED_RELEASE_TABLET | Freq: Once | ORAL | Status: AC
  Administered 2023-06-19: 40 meq via ORAL
  Filled 2023-06-19: qty 2

## 2023-06-19 NOTE — Progress Notes (Signed)
Daily Progress Note Intern Pager: [phone]  Patient name: Chelsea [name] Medical record number: [id number] Date of birth: March 02, 1966 Age: 57 y.o. Gender: female  Primary Care Provider: Pcp, No Consultants: Infectious disease, pulm Code Status: Full  Pt Overview and Major Events to Date:  Chelsea [name] is a 57 y.o. female with a PMHx of HFpEF, aortic valve steonosis, paroxysmal afib/flutter, chronic respiratory failure on continuous O2 (5L home), OSH/OSA and COPD admitted for multifocal PNA. 9/14 - admitted 9/15 - SOB improved 9/16 - weaned of bipap during day 9/17 - Pt mentation improving. Hypokalemia present, repleted K. 9/18 - Pt improved. Hypokalemia improved.    Antimicrobial Courses: 9/13 Vancomycin >> Completes 9/19 9/13 Cefepime >>  Completes 9/19 Assessment & Plan Multifocal pneumonia Patient afebrile overnight. No leukocytosis. Blood cx NG3d. MRSA+ nasal swab.  - Continue Cefepime and Vanc - Continue home pulmicort BID, albuterol q6 PRN - Continue home O2 and bipap while sleeping or napping Hypercarbia Patient hypercarbic yesterday evening with venous pCO2 of 77, s/p 1 dose of Demox with improvement to CO2 41. Patient appears around baseline mentation.  - AM BMP - CTM for changes in mentation Type 2 diabetes mellitus with diabetic polyneuropathy, without long-term current use of insulin (HCC) Glucose appropriate - continue Jardiance 10mg  daily - CBG QID+ SSI Hypokalemia Hypokalemic at admission, had 40 meq earlier in admission. K 2.7 this morning.  - AM BMP, Mg, CBC - replete electrolytes as needed - repleted K with 40 meq Lymphedema Patient with chronic bilateral LE lymphedema.  - Per CCM rec, bilateral LE Doppler US yesterday - showed no concerns Acute respiratory failure with hypoxia and hypercarbia (HCC) - Cont home pulmicort - Cont home albuterol  - Target O2 sats 88-92 Chronic combined systolic (congestive) and diastolic (congestive)  heart failure (HCC)   Chronic and Stable Issues: HFpEF - Continue home Spironolactone 25 mg daily, torsemide 60 BID Paroxsymal a/fib/flutter - continue amiodarone 200 mg BID then 200 mg daily starting 06/22/23. Eliquis 5mg  BID.  COPD - Continue Pulmicort nebulizer BID, goal O2 88-92%  Restless Leg Syndrome - Continue requip 0.25 mg qhs and gabapentin 400 mg TID Chronic IDA - Continue ferous sulfate 325 mg daily Hypothyroidism - Continue Synthroid 150 mcg daily HLD - Continue atorvostatin 20 mg dialy Anxiety - Continue wellbutrin 100mg  bid, atarax 25mg  TID PRN    FEN/GI: NPO, sips with meds while on BIPAP PPx: Cont home eliquis 5 mg Dispo: SNF pending clinical improvement - Tentatively 9/19  Subjective:  Pt was more alert and aware today than yesterday. Was able to participate and ask questions. AxO4. Pt asked questions about her course of pneumonia. Informed her that she would complete her IV antibiotics 9/19, and that based on her clinical improvement, the team would be transitioning her towards discharge. Pt expresses some anxiety about leaving the hospital, but attempted to reassure her.   Objective: Temp:  [98 F (36.7 C)-98.9 F (37.2 C)] 98.1 F (36.7 C) (09/18 0739) Pulse Rate:  [61-67] 63 (09/18 0739) Resp:  [14-20] 16 (09/18 0739) BP: (99-131)/(54-91) 110/76 (09/18 0739) SpO2:  [92 %-100 %] 100 % (09/18 0739) Weight:  [151.4 kg] 151.4 kg (09/18 0500)  Physical Exam: General: Alert, responsive. Chronically ill patient who is bed bound. Cardiovascular: Holosystolic murmur. No rubs or gallops appreciated. Respiratory: Difficult to auscultate patient's lungs due to habitus and inability to sit upright. Abdomen: Non-tender to palpation. Difficult to [facility] Extremities: Lymphedema of lower limbs. Chronic venous stasis. Pt  asked for help with her fingernails and toenails.  Laboratory: Most recent CBC Lab Results  Component Value Date   WBC 6.8 06/18/2023   HGB 11.1 (L)  06/18/2023   HCT 36.3 06/18/2023   MCV 95.8 06/18/2023   PLT 209 06/18/2023   Most recent BMP    Latest Ref Rng & Units 06/19/2023    6:58 AM  BMP  Glucose 70 - 99 mg/dL 401   BUN 6 - 20 mg/dL 21   Creatinine 0.27 - 1.00 mg/dL 2.53   Sodium 664 - 403 mmol/L 137   Potassium 3.5 - 5.1 mmol/L 3.7   Chloride 98 - 111 mmol/L 93   CO2 22 - 32 mmol/L 34   Calcium 8.9 - 10.3 mg/dL 8.5     Other pertinent labs Repeat BMP, K tomorrow.   Imaging/Diagnostic Tests: Radiologist Impression: no interval scans My interpretation: no interval scans  Margaretmary Dys, MD 06/19/2023, 8:26 AM  PGY-1, [facility] Family Medicine FPTS Intern pager: [phone], text pages welcome Secure chat group [facility] Teaching Service

## 2023-06-19 NOTE — Inpatient Diabetes Management (Signed)
Inpatient Diabetes Program Recommendations  AACE/ADA: New Consensus Statement on Inpatient Glycemic Control (2015)  Target Ranges:  Prepandial:   less than 140 mg/dL      Peak postprandial:   less than 180 mg/dL (1-2 hours)      Critically ill patients:  140 - 180 mg/dL   Lab Results  Component Value Date   GLUCAP 188 (H) 06/19/2023   HGBA1C 8.5 (H) 05/29/2023    Review of Glycemic Control  Latest Reference Range & Units 06/17/23 21:20 06/18/23 06:05 06/18/23 12:22 06/18/23 17:02 06/18/23 21:32 06/19/23 08:05  Glucose-Capillary 70 - 99 mg/dL 332 (H) 951 (H) 884 (H) 197 (H) 203 (H) 188 (H)   Diabetes history: DM 2 Outpatient Diabetes medications:  Jardiance 10 mg daily  Current orders for Inpatient glycemic control:  Jardiance 10 mg daily Novolog 0-20 units tid with meals Inpatient Diabetes Program Recommendations:    Note CBG's>goal.  Consider adding Semglee 15 units daily (0.1 units/kg).   Thanks,  Beryl Meager, RN, BC-ADM Inpatient Diabetes Coordinator Pager [phone] (8a-5p)

## 2023-06-19 NOTE — Assessment & Plan Note (Signed)
-   Cont home pulmicort - Cont home albuterol  - Target O2 sats 88-92

## 2023-06-19 NOTE — Assessment & Plan Note (Signed)
Patient hypercarbic yesterday evening with venous pCO2 of 77, s/p 1 dose of Demox with improvement to CO2 41. Patient appears around baseline mentation.  - AM BMP - CTM for changes in mentation

## 2023-06-19 NOTE — Progress Notes (Signed)
NAME:  Chelsea [name], MRN:  [id number], DOB:  [birth year], LOS: 5 ADMISSION DATE:  06/14/2023, CONSULTATION DATE: 06/16/2023 REFERRING MD: Danise Edge, CHIEF COMPLAINT: Hypercapnic respiratory failure  History of Present Illness:  57 yo female presented with AMS, dypnea.  Found to have acute on chronic hypercapnia in setting of multifocal pneumonia.  Previously followed by Dr. Vassie Loll for OSA, OHS, and chronic bronchitis.    Pertinent  Medical History  CHF with CAD, Chronic bronchitis, OSA/OSH, chronic hypoxic and hypercapnic respiratory failure, A-fib, HLD, HTN, diabetes, and chronic back pain  Significant Hospital Events: Including procedures, antibiotic start and stop dates in addition to other pertinent events   9/13 Admit, start Bipap and Abx  Interim History / Subjective:  Continues slow improvement  Objective   Blood pressure 110/76, pulse 63, temperature 98.1 F (36.7 C), temperature source Oral, resp. rate 16, height 5\' 5"  (1.651 m), weight (!) 151.4 kg, SpO2 100%.        Intake/Output Summary (Last 24 hours) at 06/19/2023 1141 Last data filed at 06/19/2023 0300 Gross per 24 hour  Intake 760 ml  Output 2800 ml  Net -2040 ml   Filed Weights   06/16/23 0556 06/18/23 0607 06/19/23 0500  Weight: (!) 148.5 kg (!) 148.5 kg (!) 151.4 kg    Examination: Morbidly obese female who appears much more awake and motivated today No JVD or lymphadenopathy is appreciated Decreased breath sounds in the bases Heart sounds are regular regular rate rhythm Abdomen is obese soft and nontender Neurologically intact Much more upbeat         Resolved Hospital Problem list     Assessment & Plan:   Acute on chronic hypoxic, hypercapnic respiratory failure. - from multifocal pneumonia with hx of OSA/OHS and chronic bronchitis - PSG 06/02/20 >> AHI 17.7, SpO2 low 58% Plan: Goal is O2 saturations between 8894% BiPAP nocturnally and as needed Bronchodilators Out of bed as  tolerated Complete antimicrobial therapy Diuresis as tolerated  Acute on chronic HFpEF. PAF. Aortic stenosis. Echo 06/01/23 >> EF 60 to 65%, mild/mod LA dilation, mod AS   Plan: Continue current treatment per primary   Labs       Latest Ref Rng & Units 06/19/2023    6:58 AM 06/18/2023    7:37 PM 06/18/2023    9:09 AM  CMP  Glucose 70 - 99 mg/dL 409  811  914   BUN 6 - 20 mg/dL 21  21  19    Creatinine 0.44 - 1.00 mg/dL 7.82  9.56  2.13   Sodium 135 - 145 mmol/L 137  136  136   Potassium 3.5 - 5.1 mmol/L 3.7  3.5  2.7   Chloride 98 - 111 mmol/L 93  88  89   CO2 22 - 32 mmol/L 34  37  38   Calcium 8.9 - 10.3 mg/dL 8.5  8.5  8.4       Latest Ref Rng & Units 06/18/2023    9:09 AM 06/17/2023    4:00 AM 06/16/2023    4:29 AM  CBC  WBC 4.0 - 10.5 K/uL 6.8  6.3  7.3   Hemoglobin 12.0 - 15.0 g/dL 08.6  57.8  46.9   Hematocrit 36.0 - 46.0 % 36.3  37.0  38.7   Platelets 150 - 400 K/uL 209  215  198     ABG    Component Value Date/Time   PHART 7.384 04/09/2022 1932   PCO2ART 46.6 04/09/2022 1932   PO2ART 106  04/09/2022 1932   HCO3 53.0 (H) 06/16/2023 1605   TCO2 44 (H) 06/14/2023 0338   TCO2 37 (H) 06/14/2023 0338   O2SAT 66.4 06/16/2023 1605    CBG (last 3)  Recent Labs    06/18/23 1702 06/18/23 2132 06/19/23 0805  GLUCAP 197* 203* 188*    Signature:   Brett Canales Annaclaire Walsworth ACNP Acute Care Nurse Practitioner [facility] Pul[facility]/Critical [facility] Please consult Amion 06/19/2023, 11:41 AM  or [phone] 06/19/2023, 11:41 AM

## 2023-06-19 NOTE — Telephone Encounter (Signed)
Needs hospital follow up with APP or DR. Alva to discuss sleep concerns. Possibly needs Bpap titration study.

## 2023-06-19 NOTE — TOC Progression Note (Signed)
Transition of Care [facility]) - Progression Note    Patient Details  Name: Chelsea [name] MRN: [id number] Date of Birth: 01-03-1966  Transition of Care [facility]) CM/SW Contact  Carley Hammed, LCSW Phone Number: 06/19/2023, 11:42 AM  Clinical Narrative:     TOC continues to follow pt's hospital course and will assist with DC needs when medically ready. [facility] continues to follow and will admit pt under LTC when discharged. TOC is available for any further needs.   Expected Discharge Plan: Home w Home Health Services Barriers to Discharge: Continued Medical Work up  Expected Discharge Plan and Services                                               Social Determinants of Health (SDOH) Interventions SDOH Screenings   Food Insecurity: No Food Insecurity (06/06/2023)  Housing: Medium Risk (06/06/2023)  Transportation Needs: No Transportation Needs (06/06/2023)  Utilities: At Risk (06/06/2023)  Alcohol Screen: Low Risk  (07/05/2022)  Financial Resource Strain: Low Risk  (07/05/2022)  Physical Activity: Insufficiently Active (03/27/2021)   Received from [facility], [facility]  Social Connections: Unknown (02/04/2022)   Received from [facility], [facility]  Stress: No Stress Concern Present (09/15/2021)   Received from [facility], [facility]  Tobacco Use: Low Risk  (06/14/2023)    Readmission Risk Interventions    07/23/2022    2:19 PM 04/09/2022    4:17 PM  Readmission Risk Prevention Plan  Transportation Screening Complete Complete  PCP or Specialist Appt within 3-5 Days  Complete  HRI or Home Care Consult  Complete  Social Work Consult for Recovery Care Planning/Counseling  Complete  Palliative Care Screening  Not Applicable  Medication Review Oceanographer) Complete Referral to Pharmacy  PCP or Specialist appointment within 3-5 days of discharge Complete   HRI or Home Care Consult Complete   SW Recovery Care/Counseling Consult Complete    Palliative Care Screening Not Applicable   Skilled Nursing Facility Complete

## 2023-06-19 NOTE — Assessment & Plan Note (Addendum)
Hypokalemic at admission, had 40 meq earlier in admission. K 2.7 this morning.  - AM BMP, Mg, CBC - replete electrolytes as needed - repleted K with 40 meq

## 2023-06-19 NOTE — Assessment & Plan Note (Addendum)
Patient with chronic bilateral LE lymphedema.  - Per CCM rec, bilateral LE Doppler US yesterday - showed no concerns

## 2023-06-19 NOTE — Assessment & Plan Note (Signed)
Patient afebrile overnight. No leukocytosis. Blood cx NG3d. MRSA+ nasal swab.  - Continue Cefepime and Vanc - Continue home pulmicort BID, albuterol q6 PRN - Continue home O2 and bipap while sleeping or napping

## 2023-06-19 NOTE — Assessment & Plan Note (Signed)
Glucose appropriate - continue Jardiance 10mg  daily - CBG QID+ SSI

## 2023-06-20 DIAGNOSIS — J189 Pneumonia, unspecified organism: Secondary | ICD-10-CM | POA: Diagnosis not present

## 2023-06-20 LAB — BASIC METABOLIC PANEL
Anion gap: 12 (ref 5–15)
BUN: 26 mg/dL — ABNORMAL HIGH (ref 6–20)
CO2: 33 mmol/L — ABNORMAL HIGH (ref 22–32)
Calcium: 8.3 mg/dL — ABNORMAL LOW (ref 8.9–10.3)
Chloride: 89 mmol/L — ABNORMAL LOW (ref 98–111)
Creatinine, Ser: 1.03 mg/dL — ABNORMAL HIGH (ref 0.44–1.00)
GFR, Estimated: >60 mL/min (ref >60)
Glucose, Bld: 234 mg/dL — ABNORMAL HIGH (ref 70–99)
Potassium: 4 mmol/L (ref 3.5–5.1)
Sodium: 134 mmol/L — ABNORMAL LOW (ref 135–145)

## 2023-06-20 LAB — GLUCOSE, CAPILLARY
Glucose-Capillary: 206 mg/dL — ABNORMAL HIGH (ref 70–99)
Glucose-Capillary: 240 mg/dL — ABNORMAL HIGH (ref 70–99)
Glucose-Capillary: 275 mg/dL — ABNORMAL HIGH (ref 70–99)
Glucose-Capillary: 183 mg/dL — ABNORMAL HIGH (ref 70–99)

## 2023-06-20 LAB — MAGNESIUM: Magnesium: 2.2 mg/dL (ref 1.7–2.4)

## 2023-06-20 MED ORDER — POTASSIUM CHLORIDE 20 MEQ PO PACK
40.0000 meq | PACK | Freq: Every day | ORAL | Status: DC
  Filled 2023-06-20: qty 2

## 2023-06-20 MED ORDER — POTASSIUM CHLORIDE CRYS ER 20 MEQ PO TBCR
40.0000 meq | EXTENDED_RELEASE_TABLET | Freq: Once | ORAL | Status: AC
  Administered 2023-06-21: 40 meq via ORAL
  Filled 2023-06-20: qty 2

## 2023-06-20 MED ORDER — POTASSIUM CHLORIDE CRYS ER 20 MEQ PO TBCR
40.0000 meq | EXTENDED_RELEASE_TABLET | Freq: Once | ORAL | Status: AC
  Administered 2023-06-20: 40 meq via ORAL
  Filled 2023-06-20: qty 2

## 2023-06-20 MED ORDER — INSULIN GLARGINE-YFGN 100 UNIT/ML ~~LOC~~ SOLN
5.0000 [IU] | Freq: Every day | SUBCUTANEOUS | Status: DC
  Administered 2023-06-20 – 2023-06-22 (×3): 5 [IU] via SUBCUTANEOUS
  Filled 2023-06-20 (×3): qty 0.05

## 2023-06-20 MED ORDER — INSULIN GLARGINE-YFGN 100 UNIT/ML ~~LOC~~ SOLN
5.0000 [IU] | Freq: Every day | SUBCUTANEOUS | Status: DC
  Filled 2023-06-20: qty 0.05

## 2023-06-20 NOTE — Assessment & Plan Note (Addendum)
-   Cont home pulmicort - Cont home albuterol  - Target O2 sats >95

## 2023-06-20 NOTE — Assessment & Plan Note (Addendum)
Hypokalemic at admission, and at several points during this hospitalization in the setting of aggressive diuresis.  - AM BMP, Mg, CBC - Start supplementation of potassium with 40 meq daily

## 2023-06-20 NOTE — Assessment & Plan Note (Deleted)
Patient hypercarbic yesterday evening with venous pCO2 of 77, s/p 1 dose of Demox with improvement to CO2 41. Patient appears around baseline mentation.  - AM BMP - CTM for changes in mentation

## 2023-06-20 NOTE — Care Management Important Message (Signed)
Important Message  Patient Details  Name: Chelsea [name] MRN: [id number] Date of Birth: 14-Aug-1966   Medicare Important Message Given:  Yes     Sherilyn Banker 06/20/2023, 3:38 PM

## 2023-06-20 NOTE — Plan of Care (Signed)
  Problem: Education: Goal: Ability to describe self-care measures that may prevent or decrease complications (Diabetes Survival Skills Education) will improve Outcome: Progressing Goal: Individualized Educational Video(s) Outcome: Progressing   Problem: Coping: Goal: Ability to adjust to condition or change in health will improve Outcome: Progressing   Problem: Fluid Volume: Goal: Ability to maintain a balanced intake and output will improve Outcome: Progressing   Problem: Health Behavior/Discharge Planning: Goal: Ability to identify and utilize available resources and services will improve Outcome: Progressing Goal: Ability to manage health-related needs will improve Outcome: Progressing   Problem: Metabolic: Goal: Ability to maintain appropriate glucose levels will improve Outcome: Progressing   Problem: Nutritional: Goal: Maintenance of adequate nutrition will improve Outcome: Progressing Goal: Progress toward achieving an optimal weight will improve Outcome: Progressing   Problem: Skin Integrity: Goal: Risk for impaired skin integrity will decrease Outcome: Progressing   Problem: Tissue Perfusion: Goal: Adequacy of tissue perfusion will improve Outcome: Progressing   Problem: Education: Goal: Knowledge of General Education information will improve Description: Including pain rating scale, medication(s)/side effects and non-pharmacologic comfort measures Outcome: Progressing   Problem: Health Behavior/Discharge Planning: Goal: Ability to manage health-related needs will improve Outcome: Progressing   Problem: Clinical Measurements: Goal: Ability to maintain clinical measurements within normal limits will improve Outcome: Progressing Goal: Will remain free from infection Outcome: Progressing Goal: Diagnostic test results will improve Outcome: Progressing Goal: Respiratory complications will improve Outcome: Progressing Goal: Cardiovascular complication will  be avoided Outcome: Progressing   Problem: Activity: Goal: Risk for activity intolerance will decrease Outcome: Progressing   Problem: Nutrition: Goal: Adequate nutrition will be maintained Outcome: Progressing   Problem: Coping: Goal: Level of anxiety will decrease Outcome: Progressing   Problem: Elimination: Goal: Will not experience complications related to bowel motility Outcome: Progressing Goal: Will not experience complications related to urinary retention Outcome: Progressing   Problem: Pain Managment: Goal: General experience of comfort will improve Outcome: Progressing   Problem: Safety: Goal: Ability to remain free from injury will improve Outcome: Progressing   Problem: Skin Integrity: Goal: Risk for impaired skin integrity will decrease Outcome: Progressing    Panagiotis Oelkers Tamera Stands, RN

## 2023-06-20 NOTE — Progress Notes (Addendum)
Daily Progress Note Intern Pager: [phone]  Patient name: Chelsea [name] Medical record number: [id number] Date of birth: May 22, 1966 Age: 57 y.o. Gender: female  Primary Care Provider: Pcp, No Consultants: Infectious disease, pulm Code Status: Full  Pt Overview and Major Events to Date:  Chelsea [name] is a 57 y.o. female with a PMHx of HFpEF, aortic valve steonosis, paroxysmal afib/flutter, chronic respiratory failure on continuous O2 (5L home), OSH/OSA and COPD admitted for multifocal PNA. 9/14 - admitted 9/15 - SOB improved 9/16 - weaned of bipap during day 9/17 - Pt mentation improving. Hypokalemia present, repleted K. 9/18 - Pt improved. Hypokalemia improved. 9/19 - Pt completes IV antibiotic course per ID recs.    Antimicrobial Courses: 9/13 Vancomycin >> Completes today 9/19 9/13 Cefepime >>  Completes  today 9/19 Assessment & Plan Multifocal pneumonia Patient afebrile overnight. BP stable.  - Completing Cefepime and Vancomycin regimens 9/19 - Continue home pulmicort BID, albuterol q6 PRN - Continue Bipap while sleeping or napping Type 2 diabetes mellitus with diabetic polyneuropathy, without long-term current use of insulin (HCC) Glucose control inadequate during this admission. Starting long acting insulin. - Semglee 5 units daily - continue Jardiance 10mg  daily - CBG QID+ SSI - follow up with outpatient provider for restarting a GLP-1.  Hypokalemia Hypokalemic at admission, and at several points during this hospitalization in the setting of aggressive diuresis.  - AM BMP, Mg, CBC - Start supplementation of potassium with 40 meq daily Lymphedema Patient with chronic bilateral LE lymphedema.  - Per CCM rec, bilateral LE Doppler US 9/17 - showed no concerns Acute respiratory failure with hypoxia and hypercarbia (HCC) - Cont home pulmicort - Cont home albuterol  - Target O2 sats >95 Chronic combined systolic (congestive) and diastolic (congestive)  heart failure (HCC)   Chronic and Stable Issues: Paroxsymal a/fib/flutter - continue amiodarone 200 mg BID then 200 mg daily starting 06/22/23. Eliquis 5mg  BID.  Restless Leg Syndrome - Continue requip 0.25 mg qhs and gabapentin 400 mg TID Chronic IDA - Continue ferous sulfate 325 mg daily Hypothyroidism - Continue Synthroid 150 mcg daily HLD - Continue atorvostatin 20 mg dialy Anxiety - Continue wellbutrin 100mg  bid, atarax 25mg  TID PRN    FEN/GI: Regular diet. PPx: Cont home eliquis 5 mg Dispo: LTAC pending clinical improvement - Tentatively 9/20  Subjective:  Pt was alert and aware today.  AxO4. Pt asked questions about her course of pneumonia and her various medical conditions. Informed her that she would complete her IV antibiotics today. Pt expresses some anxiety about leaving the hospital, but attempted to reassure her.  Patient is slightly upset that her glycemic control has not been within range. No complaints.  Objective: Temp:  [97.8 F (36.6 C)-98.3 F (36.8 C)] 97.8 F (36.6 C) (09/19 0341) Pulse Rate:  [56-72] 56 (09/19 0341) Resp:  [16-18] 16 (09/19 0341) BP: (95-125)/(63-80) 125/80 (09/19 0341) SpO2:  [94 %-98 %] 98 % (09/19 0341) Weight:  [152.9 kg] 152.9 kg (09/19 0343)  Physical Exam: General: Alert, responsive. Chronically ill patient who is bed bound. Cardiovascular: Holosystolic murmur. No rubs or gallops appreciated. Respiratory: Difficult to auscultate patient's lungs due to habitus and inability to sit upright. Abdomen: Non-tender to palpation. NBS. Cannot palpate for masses or organomegaly. Abdomen tight. Extremities: Lymphedema of lower limbs. Chronic venous stasis. Pt's husband clipped her fingernails on 9/18. She asked for help with her toenails.  Laboratory: Most recent CBC Lab Results  Component Value Date   WBC 6.8 06/18/2023  HGB 11.1 (L) 06/18/2023   HCT 36.3 06/18/2023   MCV 95.8 06/18/2023   PLT 209 06/18/2023   Most recent BMP     Latest Ref Rng & Units 06/20/2023    2:55 AM  BMP  Glucose 70 - 99 mg/dL 161   BUN 6 - 20 mg/dL 26   Creatinine 0.96 - 1.00 mg/dL 0.45   Sodium 409 - 811 mmol/L 134   Potassium 3.5 - 5.1 mmol/L 4.0   Chloride 98 - 111 mmol/L 89   CO2 22 - 32 mmol/L 33   Calcium 8.9 - 10.3 mg/dL 8.3     Other pertinent labs Repeat BMP, K tomorrow.   Imaging/Diagnostic Tests: Radiologist Impression: no interval scans My interpretation: no interval scans  Margaretmary Dys, MD 06/20/2023, 7:55 AM  PGY-1, [facility] Family Medicine FPTS Intern pager: [phone], text pages welcome Secure chat group [facility] Teaching Service

## 2023-06-20 NOTE — Discharge Instructions (Addendum)
Dear Chelsea [name],   Thank you so much for allowing [place] to be part of your care!  You were admitted to [facility] for shortness of breath  Continuing with Torsemide 60 mg twice daily outpatient    POST-HOSPITAL & CARE INSTRUCTIONS Please let PCP/Specialists know of any changes that were made.  Please see medications section of this packet for any medication changes.   DOCTOR'S APPOINTMENT & FOLLOW UP CARE INSTRUCTIONS  No future appointments.    Take care and be well!  Family Medicine Teaching Service  [place]  [facility]  [address] [place], [place] [address] [phone]

## 2023-06-20 NOTE — Assessment & Plan Note (Addendum)
Patient afebrile overnight. BP stable.  - Completing Cefepime and Vancomycin regimens 9/19 - Continue home pulmicort BID, albuterol q6 PRN - Continue Bipap while sleeping or napping

## 2023-06-20 NOTE — Assessment & Plan Note (Addendum)
Glucose control inadequate during this admission. Starting long acting insulin. - Semglee 5 units daily - continue Jardiance 10mg  daily - CBG QID+ SSI - follow up with outpatient provider for restarting a GLP-1.

## 2023-06-20 NOTE — Assessment & Plan Note (Addendum)
Patient with chronic bilateral LE lymphedema.  - Per CCM rec, bilateral LE Doppler US 9/17 - showed no concerns

## 2023-06-21 ENCOUNTER — Inpatient Hospital Stay (HOSPITAL_COMMUNITY): Payer: Medicare Other

## 2023-06-21 DIAGNOSIS — J189 Pneumonia, unspecified organism: Secondary | ICD-10-CM | POA: Diagnosis not present

## 2023-06-21 LAB — BASIC METABOLIC PANEL
Anion gap: 8 (ref 5–15)
BUN: 31 mg/dL — ABNORMAL HIGH (ref 6–20)
CO2: 37 mmol/L — ABNORMAL HIGH (ref 22–32)
Calcium: 8.4 mg/dL — ABNORMAL LOW (ref 8.9–10.3)
Chloride: 92 mmol/L — ABNORMAL LOW (ref 98–111)
Creatinine, Ser: 1.02 mg/dL — ABNORMAL HIGH (ref 0.44–1.00)
GFR, Estimated: >60 mL/min (ref >60)
Glucose, Bld: 232 mg/dL — ABNORMAL HIGH (ref 70–99)
Potassium: 3.9 mmol/L (ref 3.5–5.1)
Sodium: 137 mmol/L (ref 135–145)

## 2023-06-21 LAB — GLUCOSE, CAPILLARY
Glucose-Capillary: 163 mg/dL — ABNORMAL HIGH (ref 70–99)
Glucose-Capillary: 223 mg/dL — ABNORMAL HIGH (ref 70–99)
Glucose-Capillary: 232 mg/dL — ABNORMAL HIGH (ref 70–99)
Glucose-Capillary: 258 mg/dL — ABNORMAL HIGH (ref 70–99)

## 2023-06-21 MED ORDER — ROPINIROLE HCL 0.5 MG PO TABS: 0.5000 mg | ORAL_TABLET | Freq: Every day | ORAL | Status: AC

## 2023-06-21 MED ORDER — AMIODARONE HCL 200 MG PO TABS
200.0000 mg | ORAL_TABLET | Freq: Every day | ORAL | Status: DC
  Administered 2023-06-22 – 2023-06-24 (×3): 200 mg via ORAL
  Filled 2023-06-21 (×3): qty 1

## 2023-06-21 MED ORDER — POTASSIUM CHLORIDE CRYS ER 20 MEQ PO TBCR
40.0000 meq | EXTENDED_RELEASE_TABLET | Freq: Every day | ORAL | Status: AC
  Administered 2023-06-22: 40 meq via ORAL
  Filled 2023-06-21: qty 2

## 2023-06-21 MED ORDER — OXYCODONE-ACETAMINOPHEN 10-325 MG PO TABS: 1.0000 | ORAL_TABLET | Freq: Four times a day (QID) | ORAL | 0 refills | Status: DC

## 2023-06-21 MED ORDER — AMIODARONE HCL 200 MG PO TABS: 200.0000 mg | ORAL_TABLET | Freq: Every day | ORAL | Status: AC

## 2023-06-21 MED ORDER — TORSEMIDE 60 MG PO TABS: 60.0000 mg | ORAL_TABLET | Freq: Two times a day (BID) | ORAL | Status: DC

## 2023-06-21 MED ORDER — POTASSIUM CHLORIDE CRYS ER 20 MEQ PO TBCR: 40.0000 meq | EXTENDED_RELEASE_TABLET | Freq: Every day | ORAL | Status: DC

## 2023-06-21 NOTE — Progress Notes (Addendum)
Daily Progress Note Intern Pager: [phone]  Patient name: Chelsea [name] Medical record number: [id number] Date of birth: 12/23/1965 Age: 57 y.o. Gender: female  Primary Care Provider: Pcp, No Consultants: Infectious disease, Pulmonology Code Status: Full  Pt Overview and Major Events to Date:  EARMA YIELDING is a 57 y.o. female with a PMHx of HFpEF, aortic valve steonosis, paroxysmal afib/flutter, chronic respiratory failure on continuous O2 (5L home), OSH/OSA and COPD admitted for multifocal PNA. 9/14 - admitted 9/15 - SOB improved 9/16 - weaned of bipap during day 9/17 - Pt mentation improving. Hypokalemia present, repleted K. 9/18 - Pt improved. Hypokalemia improved. 9/19 - Pt completes IV antibiotic course per ID recs. 9/20 - Pt awaiting appropriate bariatric bed at facility.  Assessment and Plan: Assessment & Plan Multifocal pneumonia (resolved - awaiting placement) Patient afebrile overnight. BP stable. Pt has completed her treatment for pneumonia and is back at her baseline  - Cefepime and Vancomycin regimens completed 9/19 - Continue home pulmicort BID, albuterol q6 PRN - Continue Bipap while sleeping or napping Acute respiratory failure with hypoxia and hypercarbia (HCC) Pt's acute respiratory failure has resolved, she is back at her baseline 3L [place] O2 during the daytime. She continues to benefit from Bipap during naps and overnight. Team confirmed presence of Bipap at outpatient location. - Continue BiPap at night, to be continued outpatient. - Cont home pulmicort - Cont home albuterol  - Target O2 sats >95 Type 2 diabetes mellitus with diabetic polyneuropathy, without long-term current use of insulin (HCC) Glucose control has been challenging during this admission. Starting long acting insulin. - Semglee 5 units daily - Continue Jardiance 10mg  daily - CBG QID+ SSI - follow up with outpatient provider for restarting a GLP-1.  (HFpEF) heart failure  with preserved ejection fraction (HCC) Continue home Spironolactone 25 mg daily, torsemide 60 BID  Hypokalemia Hypokalemic at admission, and at several points during this hospitalization in the setting of aggressive diuresis.  Given that she will be discharged on diuretics, woul - AM BMP, Mg, CBC - Start supplementation of potassium with 40 meq daily Lymphedema Patient with chronic bilateral LE lymphedema.  - Per CCM rec, bilateral LE Doppler US 9/17 - showed no concerns   Chronic and Stable Issues: Paroxsymal a/fib/flutter - continue amiodarone 200 mg BID then 200 mg daily starting 06/22/23. Eliquis 5mg  BID.  COPD - Continue Pulmicort nebulizer BID, goal O2 88-92%  Restless Leg Syndrome - Continue requip 0.25 mg qhs and gabapentin 400 mg TID Chronic IDA - Continue ferous sulfate 325 mg daily Hypothyroidism - Continue Synthroid 150 mcg daily HLD - Continue atorvostatin 20 mg dialy Anxiety - Continue wellbutrin 100mg  bid, atarax 25mg  TID PRN    FEN/GI:heart healthy  PPx: Cont home eliquis 5 mg Dispo: SNF medically stable for this   Subjective:  Patient was awake and alert during this exam. PharmD Georgina Snell was present for the exam. Patient had some concerns about going back to her facility, but was willing to talk through some of her anxieties. Suggested that she follow up with outpatient provider for an alternative depression/anxiety medication to assist her with some of her mood challenges.   Objective: Temp:  [97.5 F (36.4 C)-98.3 F (36.8 C)] 98.1 F (36.7 C) (09/20 1547) Pulse Rate:  [60-64] 64 (09/20 1547) Resp:  [16-18] 16 (09/20 1547) BP: (91-125)/(58-84) 100/58 (09/20 1547) SpO2:  [93 %-98 %] 95 % (09/20 1547) Weight:  [152.4 kg] 152.4 kg (09/20 0452)  Physical  Exam: General: Pt awake and alert, pleasant and conversant. Pt was able to answer a number of questions about her care and her medical history. Cardiovascular: Holosystolic murmur, best heard near sternal  border (consistent with her history). No rubs or gallops apprecaited. Respiratory: Patient breath sounds improved. Pt still not able to sit upright for complete lower lung field exam. Abdomen: NTTP today. Patient stated that she felt no abdominal discomfort. Extremities: Lymphedema bilaterally. Toenails clipped today (by husband 9/19). Pt has bandages over bilateral heels to prevent pressure ulcers.  Laboratory: Most recent CBC Lab Results  Component Value Date   WBC 6.8 06/18/2023   HGB 11.1 (L) 06/18/2023   HCT 36.3 06/18/2023   MCV 95.8 06/18/2023   PLT 209 06/18/2023   Most recent BMP    Latest Ref Rng & Units 06/21/2023   11:20 AM  BMP  Glucose 70 - 99 mg/dL 350   BUN 6 - 20 mg/dL 31   Creatinine 0.93 - 1.00 mg/dL 8.18   Sodium 299 - 371 mmol/L 137   Potassium 3.5 - 5.1 mmol/L 3.9   Chloride 98 - 111 mmol/L 92   CO2 22 - 32 mmol/L 37   Calcium 8.9 - 10.3 mg/dL 8.4     Other pertinent labs NA   Imaging/Diagnostic Tests: Radiologist Impression: No interval exams. My interpretation: No interval exams.  Margaretmary Dys, MD 06/21/2023, 6:04 PM  PGY-1, [facility] Family Medicine FPTS Intern pager: [phone], text pages welcome Secure chat group [facility] Teaching Service

## 2023-06-21 NOTE — Assessment & Plan Note (Addendum)
Continue home Spironolactone 25 mg daily, torsemide 60 BID

## 2023-06-21 NOTE — Assessment & Plan Note (Addendum)
Hypokalemic at admission, and at several points during this hospitalization in the setting of aggressive diuresis.  Given that she will be discharged on diuretics, woul - AM BMP, Mg, CBC - Start supplementation of potassium with 40 meq daily

## 2023-06-21 NOTE — Assessment & Plan Note (Signed)
Glucose control has been challenging during this admission. Starting long acting insulin. - Semglee 5 units daily - Continue Jardiance 10mg  daily - CBG QID+ SSI - follow up with outpatient provider for restarting a GLP-1.

## 2023-06-21 NOTE — Assessment & Plan Note (Addendum)
Patient afebrile overnight. BP stable. Pt has completed her treatment for pneumonia and is back at her baseline  - Cefepime and Vancomycin regimens completed 9/19 - Continue home pulmicort BID, albuterol q6 PRN - Continue Bipap while sleeping or napping

## 2023-06-21 NOTE — TOC Progression Note (Signed)
Transition of Care [facility]) - Progression Note    Patient Details  Name: Chelsea [name] MRN: [id number] Date of Birth: 04-01-1966  Transition of [facility]) CM/SW Contact  Carley Hammed, LCSW Phone Number: 06/21/2023, 2:59 PM  Clinical Narrative:     CSW was advised facility this morning that pt would be ready to DC. [facility] called at 2:30 and noted that pt had not done a bed hold, so they will not have an appropriate bed for her until Monday. Medical team notified to barriers. TOC will continue to follow for DC needs.   Expected Discharge Plan: Home w Home Health Services Barriers to Discharge: Continued Medical Work up  Expected Discharge Plan and Services         Expected Discharge Date: 06/21/23                                     Social Determinants of Health (SDOH) Interventions SDOH Screenings   Food Insecurity: No Food Insecurity (06/06/2023)  Housing: Medium Risk (06/06/2023)  Transportation Needs: No Transportation Needs (06/06/2023)  Utilities: At Risk (06/06/2023)  Alcohol Screen: Low Risk  (07/05/2022)  Financial Resource Strain: Low Risk  (07/05/2022)  Physical Activity: Insufficiently Active (03/27/2021)   Received from [facility]  Social Connections: Unknown (02/04/2022)   Received from [facility]  Stress: No Stress Concern Present (09/15/2021)   Received from [facility], [facility]  Tobacco Use: Low Risk  (06/14/2023)    Readmission Risk Interventions    07/23/2022    2:19 PM 04/09/2022    4:17 PM  Readmission Risk Prevention Plan  Transportation Screening Complete Complete  PCP or Specialist Appt within 3-5 Days  Complete  HRI or Home Care Consult  Complete  Social Work Consult for Recovery Care Planning/Counseling  Complete  Palliative Care Screening  Not Applicable  Medication Review [facility]) Complete Referral to Pharmacy  PCP or Specialist appointment within 3-5 days of discharge Complete   HRI  or Home Care Consult Complete   SW Recovery Care/Counseling Consult Complete   Palliative Care Screening Not Applicable   Skilled Nursing Facility Complete

## 2023-06-21 NOTE — Plan of Care (Signed)
Problem: Coping: Goal: Ability to adjust to condition or change in health will improve Outcome: Progressing   Problem: Metabolic: Goal: Ability to maintain appropriate glucose levels will improve Outcome: Progressing   Problem: Nutritional: Goal: Maintenance of adequate nutrition will improve Outcome: Progressing

## 2023-06-21 NOTE — Assessment & Plan Note (Signed)
S/p IV Lasix 80mg  BID diuresis 9/15-9/18. Does not appear profusely volume overloaded, has had good output on PO diuresis. - output yesterday - pending BMP, Mag - replete as needed - Continue home spironolactone 25mg  every day, and torsemide 60mg  BID (increased from 40mg  dose prior to admission)

## 2023-06-21 NOTE — Assessment & Plan Note (Signed)
In the setting of IV diuresis during admission.  - continue potassium every day - Goal K>4, Mg>2 - AM BMP, Mg pending - lab holiday tomorrow

## 2023-06-21 NOTE — Assessment & Plan Note (Addendum)
Pt's acute respiratory failure has resolved, she is back at her baseline 3L [place] O2 during the daytime. She continues to benefit from Bipap during naps and overnight. Team confirmed presence of Bipap at outpatient location. - Continue BiPap at night, to be continued outpatient. - Cont home pulmicort - Cont home albuterol  - Target O2 sats >95

## 2023-06-21 NOTE — Progress Notes (Signed)
Pt oxycodone script is place in pt drawer outside of her room.

## 2023-06-21 NOTE — Assessment & Plan Note (Signed)
Patient with chronic bilateral LE lymphedema.  - Per CCM rec, bilateral LE Doppler US 9/17 - showed no concerns

## 2023-06-21 NOTE — Inpatient Diabetes Management (Signed)
Inpatient Diabetes Program Recommendations  AACE/ADA: New Consensus Statement on Inpatient Glycemic Control (2015)  Target Ranges:  Prepandial:   less than 140 mg/dL      Peak postprandial:   less than 180 mg/dL (1-2 hours)      Critically ill patients:  140 - 180 mg/dL   Lab Results  Component Value Date   GLUCAP 223 (H) 06/21/2023   HGBA1C 8.5 (H) 05/29/2023    Review of Glycemic Control  Latest Reference Range & Units 06/20/23 08:11 06/20/23 11:46 06/20/23 16:12 06/20/23 21:56 06/21/23 07:21 06/21/23 11:55  Glucose-Capillary 70 - 99 mg/dL 542 (H) 706 (H) 237 (H) 183 (H) 163 (H) 223 (H)   Diabetes history: DM 2 Outpatient Diabetes medications:  Jardiance 10 mg daily  Current orders for Inpatient glycemic control:  Jardiance 10 mg daily Novolog 0-20 units tid with meals Semglee 5 units  Inpatient Diabetes Program Recommendations:    -   Add Novolog 4 units tid meal coverage if eating >50% of meals.  Thanks,  Christena Deem RN, MSN, BC-ADM Inpatient Diabetes Coordinator Team Pager [phone] (8a-5p)

## 2023-06-21 NOTE — Progress Notes (Incomplete)
Daily Progress Note Intern Pager: [phone]  Patient name: Chelsea [name] Medical record number: [id number] Date of birth: November 21, 1965 Age: 57 y.o. Gender: female  Primary Care Provider: Pcp, No Consultants: *** Code Status: ***  Pt Overview and Major Events to Date:  ***  Assessment and Plan:  ***. Pertinent PMH/PSH includes ***.  Assessment & Plan Multifocal pneumonia (resolved - awaiting placement) Patient afebrile overnight. BP stable. Pt has completed her treatment for pneumonia and is back at her baseline  - Cefepime and Vancomycin regimens completed 9/19 - Continue home pulmicort BID, albuterol q6 PRN - Continue Bipap while sleeping or napping Acute respiratory failure with hypoxia and hypercarbia (HCC) Pt's acute respiratory failure has resolved, she is back at her baseline 3L [place] O2 during the daytime. She continues to benefit from Bipap during naps and overnight. Team confirmed presence of Bipap at outpatient location. - Continue BiPap at night, to be continued outpatient. - Cont home pulmicort - Cont home albuterol  - Target O2 sats >95 Type 2 diabetes mellitus with diabetic polyneuropathy, without long-term current use of insulin (HCC) Glucose control has been challenging during this admission. Starting long acting insulin. - Semglee 5 units daily - Continue Jardiance 10mg  daily - CBG QID+ SSI - follow up with outpatient provider for restarting a GLP-1.  (HFpEF) heart failure with preserved ejection fraction (HCC) Continue home Spironolactone 25 mg daily, torsemide 60 BID  Hypokalemia Hypokalemic at admission, and at several points during this hospitalization in the setting of aggressive diuresis.  Given that she will be discharged on diuretics, woul - AM BMP, Mg, CBC - Start supplementation of potassium with 40 meq daily Lymphedema Patient with chronic bilateral LE lymphedema.  - Per CCM rec, bilateral LE Doppler US 9/17 - showed no concerns  Chronic  and Stable Problems: ***   FEN/GI: *** PPx: *** Dispo:{FPTSDISOLIST:27587} {FPTSDISOTIME:27588}. Barriers include ***.   Subjective:  ***  Objective: Temp:  [97.5 F (36.4 C)-98.2 F (36.8 C)] 98.1 F (36.7 C) (09/20 2029) Pulse Rate:  [60-66] 66 (09/20 2029) Resp:  [16-18] 18 (09/20 2029) BP: (91-125)/(58-84) 92/61 (09/20 2029) SpO2:  [93 %-98 %] 95 % (09/20 2100) Weight:  [152.4 kg] 152.4 kg (09/20 0452) Physical Exam: General: *** Cardiovascular: *** Respiratory: *** Abdomen: *** Extremities: ***  Laboratory: Most recent CBC Lab Results  Component Value Date   WBC 6.8 06/18/2023   HGB 11.1 (L) 06/18/2023   HCT 36.3 06/18/2023   MCV 95.8 06/18/2023   PLT 209 06/18/2023   Most recent BMP    Latest Ref Rng & Units 06/21/2023   11:20 AM  BMP  Glucose 70 - 99 mg/dL 147   BUN 6 - 20 mg/dL 31   Creatinine 8.29 - 1.00 mg/dL 5.62   Sodium 130 - 865 mmol/L 137   Potassium 3.5 - 5.1 mmol/L 3.9   Chloride 98 - 111 mmol/L 92   CO2 22 - 32 mmol/L 37   Calcium 8.9 - 10.3 mg/dL 8.4     Other pertinent labs ***   Imaging/Diagnostic Tests: Radiologist Impression: *** My interpretation: Para March, DO 06/21/2023, 10:07 PM  PGY-***, [facility] Family Medicine FPTS Intern pager: [phone], text pages welcome Secure chat group [facility] Teaching Service

## 2023-06-21 NOTE — Assessment & Plan Note (Signed)
Pt's acute respiratory failure has resolved, she is back at her baseline 3L [place] O2 during the daytime. She continues to benefit from Bipap during naps and overnight. Team confirmed presence of Bipap at outpatient location. - Continue BiPap at night, to be continued outpatient. - Cont home pulmicort - Cont home albuterol  - Target O2 sats >95

## 2023-06-21 NOTE — Assessment & Plan Note (Addendum)
Glucose control has been challenging during this admission. Starting long acting insulin. - Semglee 5 units daily - Continue Jardiance 10mg  daily - CBG QID+ SSI - follow up with outpatient provider for restarting a GLP-1.

## 2023-06-21 NOTE — Assessment & Plan Note (Addendum)
Patient with chronic bilateral LE lymphedema.  - Per CCM rec, bilateral LE Doppler US 9/17 - showed no concerns

## 2023-06-21 NOTE — Assessment & Plan Note (Signed)
Patient afebrile overnight. BP stable. Pt has completed her treatment for pneumonia and is back at her baseline  - Cefepime and Vancomycin regimens completed 9/19 - Continue home pulmicort BID, albuterol q6 PRN - Continue Bipap while sleeping or napping

## 2023-06-22 DIAGNOSIS — N179 Acute kidney failure, unspecified: Secondary | ICD-10-CM

## 2023-06-22 DIAGNOSIS — M79645 Pain in left finger(s): Secondary | ICD-10-CM

## 2023-06-22 DIAGNOSIS — L304 Erythema intertrigo: Secondary | ICD-10-CM

## 2023-06-22 HISTORY — DX: Erythema intertrigo: L30.4

## 2023-06-22 HISTORY — DX: Pain in left finger(s): M79.645

## 2023-06-22 LAB — GLUCOSE, CAPILLARY
Glucose-Capillary: 269 mg/dL — ABNORMAL HIGH (ref 70–99)
Glucose-Capillary: 220 mg/dL — ABNORMAL HIGH (ref 70–99)
Glucose-Capillary: 264 mg/dL — ABNORMAL HIGH (ref 70–99)
Glucose-Capillary: 223 mg/dL — ABNORMAL HIGH (ref 70–99)

## 2023-06-22 LAB — BASIC METABOLIC PANEL
Anion gap: 10 (ref 5–15)
BUN: 37 mg/dL — ABNORMAL HIGH (ref 6–20)
CO2: 36 mmol/L — ABNORMAL HIGH (ref 22–32)
Calcium: 8.4 mg/dL — ABNORMAL LOW (ref 8.9–10.3)
Chloride: 88 mmol/L — ABNORMAL LOW (ref 98–111)
Creatinine, Ser: 1.35 mg/dL — ABNORMAL HIGH (ref 0.44–1.00)
GFR, Estimated: 45 mL/min — ABNORMAL LOW (ref >60)
Glucose, Bld: 288 mg/dL — ABNORMAL HIGH (ref 70–99)
Potassium: 3.9 mmol/L (ref 3.5–5.1)
Sodium: 134 mmol/L — ABNORMAL LOW (ref 135–145)

## 2023-06-22 LAB — MAGNESIUM: Magnesium: 2.3 mg/dL (ref 1.7–2.4)

## 2023-06-22 MED ORDER — NYSTATIN 100000 UNIT/GM EX CREA
TOPICAL_CREAM | Freq: Two times a day (BID) | CUTANEOUS | Status: DC
  Filled 2023-06-22 (×2): qty 30

## 2023-06-22 MED ORDER — INSULIN GLARGINE-YFGN 100 UNIT/ML ~~LOC~~ SOLN
5.0000 [IU] | Freq: Once | SUBCUTANEOUS | Status: AC
  Administered 2023-06-22: 5 [IU] via SUBCUTANEOUS
  Filled 2023-06-22: qty 0.05

## 2023-06-22 MED ORDER — INSULIN GLARGINE-YFGN 100 UNIT/ML ~~LOC~~ SOLN
10.0000 [IU] | Freq: Every day | SUBCUTANEOUS | Status: DC
  Administered 2023-06-23 – 2023-06-24 (×2): 10 [IU] via SUBCUTANEOUS
  Filled 2023-06-22 (×2): qty 0.1

## 2023-06-22 NOTE — Assessment & Plan Note (Signed)
Pt complaining of L middle finger pain x1 year. 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 Not complaining of finger pain this morning.  - supportive care as needed

## 2023-06-22 NOTE — Progress Notes (Signed)
   06/22/23 2344  BiPAP/CPAP/SIPAP  Reason BIPAP/CPAP not in use Other(comment) (RT went to place pt on CPAP pt not ready at this moment. RT encouraged pt to call when she is ready to go on CPAP.)

## 2023-06-22 NOTE — Evaluation (Signed)
Physical Therapy Evaluation Patient Details Name: GLYN STUTZMAN MRN: [id number] DOB: 12/17/1965 Today's Date: 06/22/2023  History of Present Illness  57 yo female presents to [facility] on 06/14/2023 with multifocal PNA. PMH includes: chronic respiratory failure with hypoxia on 5LO2 chronically and bipap at night, CHF, CAD, COPD, OSA on CPAP, afib, HLD, HTN, DM II, morbid obesity, cervical cancer, and chronic back pain.  Clinical Impression  Pt presents to PT with deficits in functional mobility, balance, strength, power, endurance, skin integrity. Pt has not been standing at baseline, dependent on hoyer lift for transfers, however she has been working with therapy recently at [facility] and reports a goal of progressing to transfer training. Pt is able to perform bed mobility with significant assistance from PT at this time. Pt will benefit from PT services in an effort to improve bed mobility quality, as this could also aide in improving cardiopulmonary function and skin integrity. PT recommends return to [facility] healthcare when medically appropriate.        If plan is discharge home, recommend the following: Two people to help with walking and/or transfers;Two people to help with bathing/dressing/bathroom;Assistance with cooking/housework;Assist for transportation;Help with stairs or ramp for entrance   Can travel by private vehicle   No    Equipment Recommendations None recommended by PT  Recommendations for Other Services       Functional Status Assessment Patient has had a recent decline in their functional status and demonstrates the ability to make significant improvements in function in a reasonable and predictable amount of time.     Precautions / Restrictions Precautions Precautions: Fall Restrictions Weight Bearing Restrictions: No      Mobility  Bed Mobility Overal bed mobility: Needs Assistance Bed Mobility: Rolling, Supine to Sit, Sit to Supine Rolling: Mod  assist, Used rails   Supine to sit: Max assist, HOB elevated Sit to supine: Max assist, HOB elevated   General bed mobility comments: increased time, pt is able to mobilize LE toward edge of bed but requries assist to elevate trunk and then maxA to pivot hips further once sitting    Transfers Overall transfer level:  (pt defers attempts due to back and buttock pain)                      Ambulation/Gait                  Stairs            Wheelchair Mobility     Tilt Bed    Modified Rankin (Stroke Patients Only)       Balance Overall balance assessment: Needs assistance Sitting-balance support: No upper extremity supported, Feet supported Sitting balance-Leahy Scale: Fair Sitting balance - Comments: sitting EOB for 10 minutes                                     Pertinent Vitals/Pain Pain Assessment Pain Assessment: Faces Faces Pain Scale: Hurts even more Pain Location: back Pain Descriptors / Indicators: Aching Pain Intervention(s): Monitored during session    Home Living Family/patient expects to be discharged to:: Skilled nursing facility                   Additional Comments: pt is a resident of [facility]    Prior Function Prior Level of Function : Needs assist  Mobility (physical): Bed mobility;Transfers   Mobility Comments: assistance for bed mobility, hoyer lift for transfers. Pt had been working with therapy on exercise and bed mobility, goal to return to transfer training ADLs Comments: Pt requires assistance with bathing, dressing, hygiene tasks, IADLs     Extremity/Trunk Assessment   Upper Extremity Assessment Upper Extremity Assessment: RUE deficits/detail;LUE deficits/detail RUE Deficits / Details: ROM WFL, grossly 4/5 LUE Deficits / Details: limitations at shoulder and hand. Pt with ~50 degreees AROM shoulder flexion, elbow ROM WFL. strength not formally assessed    Lower  Extremity Assessment Lower Extremity Assessment: Generalized weakness    Cervical / Trunk Assessment Cervical / Trunk Assessment: Other exceptions Cervical / Trunk Exceptions: body habitus  Communication   Communication Communication: No apparent difficulties Cueing Techniques: Verbal cues  Cognition Arousal: Alert Behavior During Therapy: WFL for tasks assessed/performed Overall Cognitive Status: Within Functional Limits for tasks assessed                                          General Comments General comments (skin integrity, edema, etc.): VSS on 5L [place]    Exercises     Assessment/Plan    PT Assessment Patient needs continued PT services  PT Problem List Decreased strength;Decreased activity tolerance;Decreased balance;Decreased mobility;Decreased knowledge of use of DME;Decreased safety awareness;Decreased knowledge of precautions;Pain       PT Treatment Interventions DME instruction;Functional mobility training;Therapeutic activities;Therapeutic exercise;Balance training;Neuromuscular re-education;Patient/family education;Wheelchair mobility training    PT Goals (Current goals can be found in the Care Plan section)  Acute Rehab PT Goals Patient Stated Goal: to continue PT, progress to transfer training PT Goal Formulation: With patient Time For Goal Achievement: 07/06/23 Potential to Achieve Goals: Fair    Frequency Min 1X/week     Co-evaluation               AM-PAC PT "6 Clicks" Mobility  Outcome Measure Help needed turning from your back to your side while in a flat bed without using bedrails?: A Lot Help needed moving from lying on your back to sitting on the side of a flat bed without using bedrails?: A Lot Help needed moving to and from a bed to a chair (including a wheelchair)?: Total Help needed standing up from a chair using your arms (e.g., wheelchair or bedside chair)?: Total Help needed to walk in hospital room?: Total Help  needed climbing 3-5 steps with a railing? : Total 6 Click Score: 8    End of Session   Activity Tolerance: Patient limited by pain Patient left: in bed;with call bell/phone within reach;with bed alarm set Nurse Communication: Mobility status;Need for lift equipment PT Visit Diagnosis: Other abnormalities of gait and mobility (R26.89);Muscle weakness (generalized) (M62.81)    Time: 8119-1478 PT Time Calculation (min) (ACUTE ONLY): 31 min   Charges:   PT Evaluation $PT Eval Low Complexity: 1 Low   PT General Charges $$ ACUTE PT VISIT: 1 Visit         Arlyss Gandy, PT, DPT Acute Rehabilitation Office [phone]   Arlyss Gandy 06/22/2023, 4:19 PM

## 2023-06-22 NOTE — Plan of Care (Signed)

## 2023-06-22 NOTE — Progress Notes (Signed)
   06/22/23 0058  BiPAP/CPAP/SIPAP  $ Non-Invasive Home Ventilator  Subsequent  $ Face Mask Medium Yes  BiPAP/CPAP/SIPAP Pt Type Adult  BiPAP/CPAP/SIPAP Resmed  Mask Type Full face mask  Mask Size Medium  Respiratory Rate 15 breaths/min  IPAP 16 cmH20  EPAP 8 cmH2O  Pressure Support 8 cmH20  PEEP 8 cmH20  Flow Rate 5 lpm  Minute Ventilation 9.8  Leak 0  Tidal Volume (Vt) 525  Auto Titrate No  BiPAP/CPAP /SiPAP Vitals  Pulse Rate 65  Resp 15  SpO2 95 %  Bilateral Breath Sounds Clear;Diminished  MEWS Score/Color  MEWS Score 1  MEWS Score Color Green

## 2023-06-22 NOTE — Assessment & Plan Note (Signed)
Pt was started on amiodarone 200mg  BID during last admission for aflutter with rapid rates in the 140s-150s. She is scheduled to decrease her dose to 200mg  daily starting today. - aflutter stable, not observed during this admission - amiodarone 200mg  every day - continue eliquis 5mg  BID

## 2023-06-22 NOTE — Assessment & Plan Note (Signed)
Patient with erythema, warmth, moisture to skin fold on the back above sacrum.  Does not appear infected. -Attempt to keep area dry -wound care consult in case of developing pressure ulcer, appreciate recs

## 2023-06-23 ENCOUNTER — Encounter (HOSPITAL_COMMUNITY): Payer: Self-pay | Admitting: Student

## 2023-06-23 DIAGNOSIS — J189 Pneumonia, unspecified organism: Secondary | ICD-10-CM | POA: Diagnosis not present

## 2023-06-23 LAB — BASIC METABOLIC PANEL
Anion gap: 10 (ref 5–15)
BUN: 39 mg/dL — ABNORMAL HIGH (ref 6–20)
CO2: 32 mmol/L (ref 22–32)
Calcium: 8.4 mg/dL — ABNORMAL LOW (ref 8.9–10.3)
Chloride: 92 mmol/L — ABNORMAL LOW (ref 98–111)
Creatinine, Ser: 1.1 mg/dL — ABNORMAL HIGH (ref 0.44–1.00)
GFR, Estimated: 58 mL/min — ABNORMAL LOW (ref >60)
Glucose, Bld: 231 mg/dL — ABNORMAL HIGH (ref 70–99)
Potassium: 4.3 mmol/L (ref 3.5–5.1)
Sodium: 134 mmol/L — ABNORMAL LOW (ref 135–145)

## 2023-06-23 LAB — GLUCOSE, CAPILLARY
Glucose-Capillary: 180 mg/dL — ABNORMAL HIGH (ref 70–99)
Glucose-Capillary: 234 mg/dL — ABNORMAL HIGH (ref 70–99)
Glucose-Capillary: 208 mg/dL — ABNORMAL HIGH (ref 70–99)
Glucose-Capillary: 255 mg/dL — ABNORMAL HIGH (ref 70–99)

## 2023-06-23 LAB — BLOOD GAS, VENOUS
Acid-Base Excess: 11.6 mmol/L — ABNORMAL HIGH (ref 0.0–2.0)
Acid-Base Excess: 10.6 mmol/L — ABNORMAL HIGH (ref 0.0–2.0)
Bicarbonate: 40.6 mmol/L — ABNORMAL HIGH (ref 20.0–28.0)
Bicarbonate: 39.2 mmol/L — ABNORMAL HIGH (ref 20.0–28.0)
Drawn by: 1470
O2 Saturation: 76.2 %
O2 Saturation: 91.6 %
Patient temperature: 37
Patient temperature: 36.6
pCO2, Ven: 77 mmHg (ref 44–60)
pCO2, Ven: 70 mmHg — ABNORMAL HIGH (ref 44–60)
pH, Ven: 7.33 (ref 7.25–7.43)
pH, Ven: 7.36 (ref 7.25–7.43)
pO2, Ven: 49 mmHg — ABNORMAL HIGH (ref 32–45)
pO2, Ven: 62 mmHg — ABNORMAL HIGH (ref 32–45)

## 2023-06-23 MED ORDER — TORSEMIDE 20 MG PO TABS
40.0000 mg | ORAL_TABLET | Freq: Every day | ORAL | Status: DC
  Administered 2023-06-23 – 2023-06-24 (×2): 40 mg via ORAL
  Filled 2023-06-23 (×2): qty 2

## 2023-06-23 NOTE — Assessment & Plan Note (Addendum)
CBGs still not well-controlled, ranging 220-269 -Increase Semglee to 10 units daily -Continue resistant SSI -CBGs with meals, nightly

## 2023-06-23 NOTE — Progress Notes (Addendum)
FMTS Attending Daily Note: Terisa Starr, MD  Team Pager [phone] Pager [phone]  I have personally seen and examined this patient. I have reviewed their chart. I have discussed this patient with the resident physician.  Addendums to below note include:  Patient more somnolent this morning.  Suspect this is because she did not use her BiPAP.  VBG does show elevated CO2.  Restart BiPAP, repeat VBG.  Her creatinine is improved this morning will restart torsemide.  Of note she did require IV Lasix this admission and thus this is a acute exacerbation of her heart failure with preserved ejection fraction contributing to her problem #1 which is acute on chronic hypoxemic hypercarbic respiratory failure.  I agree with the remainder of the findings, exam, and plan below by the resident physician.    Disposition: facility 9/23 pending clinical status        Daily Progress Note Intern Pager: [phone]  Patient name: Chelsea [name] Medical record number: [id number] Date of birth: Apr 15, 1966 Age: 57 y.o. Gender: female  Primary Care Provider: Pcp, No Consultants: PCCM Code Status: Full  Pt Overview and Major Events to Date:  9/13-admitted, required use of BiPAP during daytime 9/16-able to wean to high flow nasal cannula  Assessment and Plan: 57 year old female who presented with altered mental status and dyspnea secondary to acute on chronic hypercarbic and hypoxic respiratory failure, now resolved and awaiting bed placement at facility. Pertinent PMH/PSH includes OHS, OSA, chronic bronchitis, CHF with CAD.  Assessment & Plan Acute respiratory failure with hypoxia and hypercarbia (HCC) Improved, was secondary to multifocal pneumonia per report BiPAP compliance.  At her baseline O2 requirement. Concern for somnolence this morning, although A&O on my exam and conversational. RT to place BiPAP today with VBG - Continue BIPAP while sleeping (although unfortunately not placed overnight due to  patient report of falling asleep without it) - continue home pulmicort neb BID - albuterol neb PRN (HFpEF) heart failure with preserved ejection fraction (HCC) S/p IV Lasix 80mg  BID diuresis 9/15-9/18.  Difficult to assess volume status due to body habitus. Diuresis held 9/21 due to AKI. UOP not documented for past 24 hours -Resume home torsemide 60 mg twice daily pending AM BMP shows Cr back to baseline Type 2 diabetes mellitus with diabetic polyneuropathy, without long-term current use of insulin (HCC) CBGs still not well-controlled, ranging 220-269 -Increase Semglee to 10 units daily -Continue resistant SSI -CBGs with meals, nightly Hx of atrial flutter Stable rates, 60s bpm. -Continue amiodarone 200 mg daily -Continue Eliquis 5 mg twice daily Intertrigo Continue wound care   Chronic and Stable Problems:  COPD-continue Pulmicort nebulizer twice daily, goal O2 saturation 88-92% Restless leg syndrome-continue Requip 0.25 mg nightly and gabapentin 400 mg 3 times daily Hypothyroidism, continue Synthroid 150 mcg daily Chronic IDA-continue sulfate 325 mg daily Hyperlipidemia-continue atorvastatin 20 mg daily Anxiety-continue Wellbutrin 100 mg twice daily and Atarax 25 mg 3 times daily as needed   FEN/GI: Heart healthy/carb modified PPx: On Eliquis Dispo: PT recommends SNF, need to clarify with SW regarding dispo  Subjective:  No complaints this morning.  She says she did not wear her CPAP overnight because she  Objective: Temp:  [97.5 F (36.4 C)-98.2 F (36.8 C)] 97.7 F (36.5 C) (09/22 0501) Pulse Rate:  [57-64] 57 (09/22 0501) Resp:  [16-20] 19 (09/22 0501) BP: (100-111)/(54-74) 104/74 (09/22 0501) SpO2:  [97 %-100 %] 98 % (09/22 0501) Weight:  [154.1 kg] 154.1 kg (09/22 0500) Physical Exam: General: Chronically ill-appearing, no distress,  alert Cardiovascular: RRR Respiratory: On 5L [place] with appropriate SpO2 Abdomen: Obese Extremities: Chronic vascular changes in  lower extremities, difficult to assess volume status with peripheral edema  Laboratory: Most recent CBC Lab Results  Component Value Date   WBC 6.8 06/18/2023   HGB 11.1 (L) 06/18/2023   HCT 36.3 06/18/2023   MCV 95.8 06/18/2023   PLT 209 06/18/2023   Most recent BMP    Latest Ref Rng & Units 06/22/2023    7:12 AM  BMP  Glucose 70 - 99 mg/dL 628   BUN 6 - 20 mg/dL 37   Creatinine 3.15 - 1.00 mg/dL 1.76   Sodium 160 - 737 mmol/L 134   Potassium 3.5 - 5.1 mmol/L 3.9   Chloride 98 - 111 mmol/L 88   CO2 22 - 32 mmol/L 36   Calcium 8.9 - 10.3 mg/dL 8.4     Darral Dash, DO 06/23/2023, 5:51 AM  PGY-3, [facility] FPTS Intern pager: [phone], text pages welcome Secure chat group [facility] Teaching Service

## 2023-06-23 NOTE — Assessment & Plan Note (Addendum)
Stable rates, 60s bpm. -Continue amiodarone 200 mg daily -Continue Eliquis 5 mg twice daily

## 2023-06-23 NOTE — Assessment & Plan Note (Addendum)
Improved, was secondary to multifocal pneumonia per report BiPAP compliance.  At her baseline O2 requirement. Concern for somnolence this morning, although A&O on my exam and conversational. RT to place BiPAP today with VBG - Continue BIPAP while sleeping (although unfortunately not placed overnight due to patient report of falling asleep without it) - continue home pulmicort neb BID - albuterol neb PRN

## 2023-06-23 NOTE — Evaluation (Signed)
Occupational Therapy Evaluation Patient Details Name: Chelsea [name] MRN: [id number] DOB: 12-13-1965 Today's Date: 06/23/2023   History of Present Illness 57 yo female presents to [facility] on 06/14/2023 with multifocal PNA. PMH includes: chronic respiratory failure with hypoxia on 5LO2 chronically and bipap at night, CHF, CAD, COPD, OSA on CPAP, afib, HLD, HTN, DM II, morbid obesity, cervical cancer, and chronic back pain.   Clinical Impression   Chelsea [name] was evaluated s/p the above admission list. She resides at [facility] where staff uses a hoyer to get her OOB and they assist her with ADLs at bed level. Upon evaluation the pt was limited by weakness, debility, back and shoulder pain, fear of falling and limited activity tolerance. Overall she needs mod A to roll and did not attempt EOB or OOB this session. Due to the deficits listed below the pt also needs total A for LB ADLs and up to mod A for UB ADLs. Pt reports her ultimate goal is to stand, walk and go home however she has not stood in >6 months. Unfortunately pt is likely at her functional baseline with no acute needs - however she will benefit from continued therapy at her current SNF to work towards her personal goals. OT to sign off acutely, thank you.        If plan is discharge home, recommend the following: Two people to help with walking and/or transfers;Two people to help with bathing/dressing/bathroom;Assist for transportation;Assistance with cooking/housework;Help with stairs or ramp for entrance    Functional Status Assessment  Patient has had a recent decline in their functional status and demonstrates the ability to make significant improvements in function in a reasonable and predictable amount of time.  Equipment Recommendations  None recommended by OT       Precautions / Restrictions Precautions Precautions: Fall Restrictions Weight Bearing Restrictions: No      Mobility Bed Mobility Overal bed  mobility: Needs Assistance Bed Mobility: Rolling Rolling: Mod assist         General bed mobility comments: heavy use of rails, limited by back pain    Transfers                ADL either performed or assessed with clinical judgement   ADL Overall ADL's : Needs assistance/impaired Eating/Feeding: Independent;Bed level   Grooming: Set up;Bed level   Upper Body Bathing: Moderate assistance;Bed level   Lower Body Bathing: Total assistance;Bed level   Upper Body Dressing : Maximal assistance;Bed level   Lower Body Dressing: Total assistance;Bed level   Toilet Transfer: Total assistance   Toileting- Clothing Manipulation and Hygiene: Total assistance       Functional mobility during ADLs: Maximal assistance General ADL Comments: limited by body habitus, LUE and weakness - she is likely at her functional baseline although she is motivated to improve function     Vision Baseline Vision/History: 0 No visual deficits Vision Assessment?: No apparent visual deficits     Perception Perception: Not tested       Praxis Praxis: Not tested       Pertinent Vitals/Pain Pain Assessment Pain Assessment: Faces Faces Pain Scale: Hurts a little bit Pain Location: LUE and back Pain Descriptors / Indicators: Aching Pain Intervention(s): Limited activity within patient's tolerance, Monitored during session     Extremity/Trunk Assessment Upper Extremity Assessment Upper Extremity Assessment: LUE deficits/detail RUE Deficits / Details: ROM WFL, grossly 4/5 LUE Deficits / Details: limited ROM at shoulder to ~80*. 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, limited  movement at 2nd adn 4th digits as well LUE Coordination: decreased fine motor;decreased gross motor   Lower Extremity Assessment Lower Extremity Assessment: Defer to PT evaluation   Cervical / Trunk Assessment Cervical / Trunk Assessment: Other exceptions Cervical / Trunk Exceptions: body habitus   Communication  Communication Communication: No apparent difficulties   Cognition Arousal: Alert Behavior During Therapy: WFL for tasks assessed/performed Overall Cognitive Status: Within Functional Limits for tasks assessed                 General Comments: verbose, motivated to [employer] return home one day. Extreme fear of falling     General Comments  VSS on 5L            Home Living Family/patient expects to be discharged to:: Skilled nursing facility                                 Additional Comments: pt is a resident of [facility]      Prior Functioning/Environment Prior Level of Function : Needs assist             Mobility Comments: assistance for bed mobility, hoyer lift for transfers to [facility]. Unable to use LUE to roll WC. Pt had been working with therapy on exercise and bed mobility, goal to return to transfer training - pt states she has not stood in over 6 months. ADLs Comments: bed level ADLs, pt states she helsp as much as she can with UE bathing, dressing and grooming.        OT Problem List: Decreased strength;Impaired balance (sitting and/or standing);Cardiopulmonary status limiting activity;Decreased activity tolerance;Decreased knowledge of use of DME or AE;Pain      OT Treatment/Interventions: Self-care/ADL training;Patient/family education;Balance training;Therapeutic activities;DME and/or AE instruction;Energy conservation;Therapeutic exercise    OT Goals(Current goals can be found in the care plan section) Acute Rehab OT Goals Patient Stated Goal: to go home eventually OT Goal Formulation: With patient Time For Goal Achievement: 06/23/23 Potential to Achieve Goals: Good  OT Frequency: Min 1X/week       AM-PAC OT "6 Clicks" Daily Activity     Outcome Measure Help from another person eating meals?: None Help from another person taking care of personal grooming?: A Little Help from another person toileting, which includes using  toliet, bedpan, or urinal?: Total Help from another person bathing (including washing, rinsing, drying)?: Total Help from another person to put on and taking off regular upper body clothing?: A Lot Help from another person to put on and taking off regular lower body clothing?: Total 6 Click Score: 12   End of Session Equipment Utilized During Treatment: Oxygen Nurse Communication: Mobility status  Activity Tolerance: Patient tolerated treatment well;Patient limited by pain Patient left: in bed;with call bell/phone within reach;with family/visitor present  OT Visit Diagnosis: Unsteadiness on feet (R26.81);Other abnormalities of gait and mobility (R26.89);Muscle weakness (generalized) (M62.81)                Time: 1610-9604 OT Time Calculation (min): 18 min Charges:  OT General Charges $OT Visit: 1 Visit OT Evaluation $OT Eval Moderate Complexity: 1 Mod  Derenda Mis, OTR/L Acute Rehabilitation Services Office [phone] Secure Chat Communication Preferred   Donia Pounds 06/23/2023, 2:55 PM

## 2023-06-23 NOTE — Assessment & Plan Note (Addendum)
S/p IV Lasix 80mg  BID diuresis 9/15-9/18.  Difficult to assess volume status due to body habitus. Diuresis held 9/21 due to AKI. UOP not documented for past 24 hours -Resume home torsemide 60 mg twice daily pending AM BMP shows Cr back to baseline

## 2023-06-23 NOTE — Progress Notes (Signed)
Patient refusing BiPAP due to being on the bedpan. RN aware.

## 2023-06-23 NOTE — Progress Notes (Signed)
Patient refused to go on BiPAP at this time. She is currently eating breakfast. RN made aware.

## 2023-06-23 NOTE — Assessment & Plan Note (Addendum)
Continue wound care. ?

## 2023-06-23 NOTE — Plan of Care (Signed)

## 2023-06-24 DIAGNOSIS — E119 Type 2 diabetes mellitus without complications: Secondary | ICD-10-CM

## 2023-06-24 DIAGNOSIS — I5033 Acute on chronic diastolic (congestive) heart failure: Secondary | ICD-10-CM

## 2023-06-24 DIAGNOSIS — N179 Acute kidney failure, unspecified: Secondary | ICD-10-CM | POA: Diagnosis not present

## 2023-06-24 DIAGNOSIS — J9601 Acute respiratory failure with hypoxia: Secondary | ICD-10-CM | POA: Diagnosis not present

## 2023-06-24 DIAGNOSIS — I890 Lymphedema, not elsewhere classified: Secondary | ICD-10-CM

## 2023-06-24 DIAGNOSIS — J189 Pneumonia, unspecified organism: Secondary | ICD-10-CM | POA: Diagnosis not present

## 2023-06-24 DIAGNOSIS — L304 Erythema intertrigo: Secondary | ICD-10-CM

## 2023-06-24 DIAGNOSIS — E876 Hypokalemia: Secondary | ICD-10-CM

## 2023-06-24 DIAGNOSIS — Z8679 Personal history of other diseases of the circulatory system: Secondary | ICD-10-CM

## 2023-06-24 LAB — CBC
HCT: 37.4 % (ref 36.0–46.0)
Hemoglobin: 11.5 g/dL — ABNORMAL LOW (ref 12.0–15.0)
MCH: 29.9 pg (ref 26.0–34.0)
MCHC: 30.7 g/dL (ref 30.0–36.0)
MCV: 97.4 fL (ref 80.0–100.0)
Platelets: 222 10*3/uL (ref 150–400)
RBC: 3.84 MIL/uL — ABNORMAL LOW (ref 3.87–5.11)
RDW: 14.4 % (ref 11.5–15.5)
WBC: 6.9 10*3/uL (ref 4.0–10.5)
nRBC: 0 % (ref 0.0–0.2)

## 2023-06-24 LAB — GLUCOSE, CAPILLARY
Glucose-Capillary: 255 mg/dL — ABNORMAL HIGH (ref 70–99)
Glucose-Capillary: 234 mg/dL — ABNORMAL HIGH (ref 70–99)

## 2023-06-24 LAB — BASIC METABOLIC PANEL
Anion gap: 13 (ref 5–15)
BUN: 39 mg/dL — ABNORMAL HIGH (ref 6–20)
CO2: 32 mmol/L (ref 22–32)
Calcium: 8.5 mg/dL — ABNORMAL LOW (ref 8.9–10.3)
Chloride: 90 mmol/L — ABNORMAL LOW (ref 98–111)
Creatinine, Ser: 1.27 mg/dL — ABNORMAL HIGH (ref 0.44–1.00)
GFR, Estimated: 49 mL/min — ABNORMAL LOW (ref >60)
Glucose, Bld: 219 mg/dL — ABNORMAL HIGH (ref 70–99)
Potassium: 4.3 mmol/L (ref 3.5–5.1)
Sodium: 135 mmol/L (ref 135–145)

## 2023-06-24 MED ORDER — NYSTATIN 100000 UNIT/GM EX CREA: TOPICAL_CREAM | Freq: Two times a day (BID) | CUTANEOUS | Status: DC

## 2023-06-24 MED ORDER — TORSEMIDE 40 MG PO TABS: 40.0000 mg | ORAL_TABLET | Freq: Two times a day (BID) | ORAL | Status: DC

## 2023-06-24 MED ORDER — FLUCONAZOLE 150 MG PO TABS: 150.0000 mg | ORAL_TABLET | ORAL | Status: AC

## 2023-06-24 MED ORDER — FLUCONAZOLE 150 MG PO TABS: 150.0000 mg | ORAL_TABLET | ORAL | Status: DC

## 2023-06-24 MED ORDER — INSULIN ASPART 100 UNIT/ML IJ SOLN: 0.0000 [IU] | Freq: Three times a day (TID) | INTRAMUSCULAR | Status: DC

## 2023-06-24 NOTE — Consult Note (Addendum)
WOC Nurse Consult Note: Reason for Consult: Consult requested for skin folds.  Pt is frequently incontinent of urine and is has been trappped against her skin with the current dressing. Cleaned and linens changed. Posterior back skin fold near sacrum is red, moist and macerated with pink painful partial thickness fissure related to moisture. Appearance is consistent with intertrigo and probable candidiasis. There are also fissures in the lower gluteal fold, abd folds, and bilat breast folds.  All of these areas also have white drainage, as well as the the perineum.  Secure chat sent to the primary team as follows: "This patient has yeast in multiple skin folds and perineum. Can you please order oral meds for this and I will provide topical treatment recommendations." Topical treatment orders provided for bedside nurses top perform as follows:  1. Leave sacrum foam dressing off; it is trapping urine next to the skin.  Apply Barrier cream and then antifungal powder to bilat buttocks/inner gluteal fold with each turning and cleaning session.  2. Apply only antifungal powder to breast /groin and posterior back folds with each turning and cleaning session.  Please re-consult if further assistance is needed.  Thank-you,  Cammie Mcgee MSN, RN, CWOCN, [place], CNS [phone]

## 2023-06-24 NOTE — Assessment & Plan Note (Deleted)
S/p IV Lasix 80mg  BID diuresis 9/15-9/18.  Difficult to assess volume status due to body habitus. Diuresis held 9/21 due to AKI. UOP not documented for past 24 hours -Resume home torsemide 60 mg twice daily pending AM BMP shows Cr back to baseline

## 2023-06-24 NOTE — TOC Transition Note (Signed)
Transition of Care [facility]) - CM/SW Discharge Note   Patient Details  Name: Chelsea [name] MRN: [id number] Date of Birth: 06-23-1966  Transition of Care [facility]) CM/SW Contact:  Carley Hammed, LCSW Phone Number: 06/24/2023, 11:23 AM   Clinical Narrative:     Pt to be transported to [facility] care via [facility] after 1 PM. Nurse to call report to [phone].  Pt requested CSW confirm her Bipap in the facility is working. Per facility, pt is non compliant with bipap, but it is on her unit and working appropriately.   Final next level of care: Skilled Nursing Facility Barriers to Discharge: Barriers Resolved   Patient Goals and CMS Choice      Discharge Placement                Patient chooses bed at: [facility] Patient to be transferred to facility by: [facility] Name of family member notified: Pt to notify Patient and family notified of of transfer: 06/24/23  Discharge Plan and Services Additional resources added to the After Visit Summary for                                       Social Determinants of Health (SDOH) Interventions SDOH Screenings   Food Insecurity: No Food Insecurity (06/06/2023)  Housing: Medium Risk (06/06/2023)  Transportation Needs: No Transportation Needs (06/06/2023)  Utilities: At Risk (06/06/2023)  Alcohol Screen: Low Risk  (07/05/2022)  Financial Resource Strain: Low Risk  (07/05/2022)  Physical Activity: Insufficiently Active (03/27/2021)   Received from [facility]  Social Connections: Unknown (02/04/2022)   Received from [facility], [facility]  Stress: No Stress Concern Present (09/15/2021)   Received from [facility], [facility]  Tobacco Use: Low Risk  (06/14/2023)     Readmission Risk Interventions    07/23/2022    2:19 PM 04/09/2022    4:17 PM  Readmission Risk Prevention Plan  Transportation Screening Complete Complete  PCP or Specialist Appt within 3-5 Days  Complete  HRI or Home Care  Consult  Complete  Social Work Consult for Recovery Care Planning/Counseling  Complete  Palliative Care Screening  Not Applicable  Medication Review [employer]) Complete Referral to Pharmacy  PCP or Specialist appointment within 3-5 days of discharge Complete   HRI or Home Care Consult Complete   SW Recovery Care/Counseling Consult Complete   Palliative Care Screening Not Applicable   Skilled Nursing Facility Complete

## 2023-06-24 NOTE — Plan of Care (Signed)
  Problem: Education: Goal: Ability to describe self-care measures that may prevent or decrease complications (Diabetes Survival Skills Education) will improve Outcome: Adequate for Discharge Goal: Individualized Educational Video(s) Outcome: Adequate for Discharge   Problem: Coping: Goal: Ability to adjust to condition or change in health will improve Outcome: Adequate for Discharge   Problem: Fluid Volume: Goal: Ability to maintain a balanced intake and output will improve Outcome: Adequate for Discharge   Problem: Health Behavior/Discharge Planning: Goal: Ability to identify and utilize available resources and services will improve Outcome: Adequate for Discharge Goal: Ability to manage health-related needs will improve Outcome: Adequate for Discharge   Problem: Metabolic: Goal: Ability to maintain appropriate glucose levels will improve Outcome: Adequate for Discharge   Problem: Nutritional: Goal: Maintenance of adequate nutrition will improve Outcome: Adequate for Discharge Goal: Progress toward achieving an optimal weight will improve Outcome: Adequate for Discharge   Problem: Skin Integrity: Goal: Risk for impaired skin integrity will decrease Outcome: Adequate for Discharge   Problem: Tissue Perfusion: Goal: Adequacy of tissue perfusion will improve Outcome: Adequate for Discharge   Problem: Education: Goal: Knowledge of General Education information will improve Description: Including pain rating scale, medication(s)/side effects and non-pharmacologic comfort measures Outcome: Adequate for Discharge   Problem: Health Behavior/Discharge Planning: Goal: Ability to manage health-related needs will improve Outcome: Adequate for Discharge   Problem: Clinical Measurements: Goal: Ability to maintain clinical measurements within normal limits will improve Outcome: Adequate for Discharge Goal: Will remain free from infection Outcome: Adequate for Discharge Goal:  Diagnostic test results will improve Outcome: Adequate for Discharge Goal: Respiratory complications will improve Outcome: Adequate for Discharge Goal: Cardiovascular complication will be avoided Outcome: Adequate for Discharge   Problem: Activity: Goal: Risk for activity intolerance will decrease Outcome: Adequate for Discharge   Problem: Nutrition: Goal: Adequate nutrition will be maintained Outcome: Adequate for Discharge   Problem: Coping: Goal: Level of anxiety will decrease Outcome: Adequate for Discharge   Problem: Elimination: Goal: Will not experience complications related to bowel motility Outcome: Adequate for Discharge Goal: Will not experience complications related to urinary retention Outcome: Adequate for Discharge   Problem: Pain Managment: Goal: General experience of comfort will improve Outcome: Adequate for Discharge   Problem: Safety: Goal: Ability to remain free from injury will improve Outcome: Adequate for Discharge   Problem: Skin Integrity: Goal: Risk for impaired skin integrity will decrease Outcome: Adequate for Discharge  Trev Boley Tamera Stands, RN

## 2023-06-24 NOTE — Assessment & Plan Note (Deleted)
Stable rates, 60s bpm. -Continue amiodarone 200 mg daily -Continue Eliquis 5 mg twice daily

## 2023-06-24 NOTE — Plan of Care (Signed)

## 2023-06-24 NOTE — Assessment & Plan Note (Deleted)
Improved, was secondary to multifocal pneumonia per report BiPAP compliance.  At her baseline O2 requirement. Concern for somnolence this morning, although A&O on my exam and conversational. RT to place BiPAP today with VBG - Continue BIPAP while sleeping (although unfortunately not placed overnight due to patient report of falling asleep without it) - continue home pulmicort neb BID - albuterol neb PRN

## 2023-06-24 NOTE — Progress Notes (Signed)
Patient is being discharged to [facility] healthcare via [facility], PIVs have been removed, AVS reviewed with patient and she states understanding.  Patient left the unit with all of her belongings in no acute stress.  Kelli Hope, RN

## 2023-06-24 NOTE — Progress Notes (Signed)
   06/24/23 0113  BiPAP/CPAP/SIPAP  $ Non-Invasive Home Ventilator  Subsequent  BiPAP/CPAP/SIPAP Pt Type Adult  BiPAP/CPAP/SIPAP Resmed  Mask Type Full face mask  Mask Size Large  Respiratory Rate 15 breaths/min  IPAP 16 cmH20  EPAP 8 cmH2O  Pressure Support 8 cmH20  Flow Rate 5 lpm  Patient Home Equipment No  Auto Titrate No  BiPAP/CPAP /SiPAP Vitals  Pulse Rate 61  Resp 16  SpO2 95 %  Bilateral Breath Sounds Clear;Diminished

## 2023-06-24 NOTE — Assessment & Plan Note (Deleted)
CBGs still not well-controlled, ranging 220-269 -Increase Semglee to 10 units daily -Continue resistant SSI -CBGs with meals, nightly

## 2023-06-24 NOTE — Discharge Summary (Signed)
Family Medicine Teaching [facility] Discharge Summary  Patient name: Chelsea [name] Medical record number: [id number] Date of birth: Mar 15, 1966 Age: 57 y.o. Gender: female Date of Admission: 06/14/2023  Date of Discharge: 06/24/23 Admitting Physician: Alfredo Martinez, MD  Primary Care Provider: Pcp, No Consultants: PCCM  Indication for Hospitalization: Dyspnea  Discharge Diagnoses/Problem List:  Principal Problem for Admission: Acute on Chronic respiratory failure  Other Problems addressed during stay:  Active Problems:   Acute respiratory failure with hypoxia and hypercarbia (HCC)   (HFpEF) heart failure with preserved ejection fraction (HCC)   Hx of atrial flutter   Lymphedema   Type 2 diabetes mellitus with diabetic polyneuropathy, without long-term current use of insulin (HCC)   Hypokalemia   Hypercarbia   AKI (acute kidney injury) (HCC)   Pain of left middle finger   Intertrigo  Brief Hospital Course:  Chelsea [name] is a 57 y.o.female with a history of history of COPD on 5L at home, HFpEF, PAF who was admitted to the [facility] Teaching Service at [facility] for respiratory distress. Her hospital course is detailed below:  Acute hypoxic hypercapnic respiratory failure in the setting of multifocal pneumonia and difficulty with BiPAP compliance The patient was started on broad-spectrum antibiotics after arrival to the emergency room for respiratory distress on 5 L of oxygen.  CT scan while in the emergency room showed multifocal pneumonia so she was treated for HAP.  The pain completed antibiotics course while she was in the hospital.  Additionally, ensured Bipap compliance while in the hospital as it appears this contributes to her respiratory decline.  The patient was able to transition down to her home oxygen requirement and tolerated BiPAP well.  She also continue home Pulmicort nebs and used albuterol as needed.  She also was given IV Lasix from 9/15 - 9/18 and resumed  torsemide dosing 40 mg twice daily given concern for AKI induced by diuresis.  Patient was stable on discharge but is high risk of readmission given her multiple comorbidities.  HFpEF Started the patient on torsemide 40 Mg twice daily to prevent AKI.  Monitor volume status outpatient.  Intertrigo  Patient was found to have concern of Candida in her skin folds.  Discussed that we will continue her SGLT2 inhibitor and give the patient 4 weeks of fluconazole weekly dosing.  QTc was appropriate for this plan.  Additionally provided nystatin cream for patient to use.  All other conditions were chronic and stable.  She was sent home on sliding scale insulin dosing for her type 2 diabetes as her diet differs greatly while at her facility compared to here.  PCP Follow-up Recommendations: F/u Chest CT in 2 months to assure resolution of nodular infiltrates Pt would benefit from restarting GLP-1a Pt would benefit from a new mood medication regimen. F/u jardiance as she has had intertrigo inpatient  Follow-up volume status that she was started on torsemide 40 mg twice daily Would recommend follow-up BMP and CBC on discharge Encourage Bipap use    Disposition: [facility]   Discharge Condition: Stable   Per Dr. Rexene Alberts on [employer]:  Discharge Exam:  Vitals:   06/24/23 0345 06/24/23 0754  BP: (!) 92/52 120/74  Pulse: 60 (!) 58  Resp: 17 16  Temp: 98.3 F (36.8 C) 97.6 F (36.4 C)  SpO2: 94% 93%   General: Alert in no apparent distress Heart: Regular rate and rhythm with no murmurs appreciated Lungs: CTA bilaterally, no wheezing, difficulty with body habitus Abdomen:  no abdominal pain Skin: Warm and dry Extremities: Chronic swelling in BLE   Significant Procedures: None   Significant Labs and Imaging:  Recent Labs  Lab 06/24/23 0245  WBC 6.9  HGB 11.5*  HCT 37.4  PLT 222   Recent Labs  Lab 06/23/23 1002 06/24/23 0245  NA 134* 135  K 4.3 4.3  CL 92* 90*  CO2  32 32  GLUCOSE 231* 219*  BUN 39* 39*  CREATININE 1.10* 1.27*  CALCIUM 8.4* 8.5*   DG Hand 2 View Left  Result Date: 06/21/2023 CLINICAL DATA:  Finger injury. EXAM: LEFT HAND - 2 VIEW COMPARISON:  None Available. FINDINGS: There is diffuse decreased bone mineralization. Moderate to severe thumb carpometacarpal joint space narrowing and subchondral sclerosis with mild to moderate peripheral osteophytosis. Mild thumb interphalangeal and second through fifth PIP and DIP joint space narrowing. Mild triscaphe joint space narrowing. No acute fracture is seen. No dislocation. IMPRESSION: 1. Moderate to severe thumb carpometacarpal osteoarthritis. 2. Mild thumb interphalangeal and second through fifth PIP and DIP osteoarthritis. Electronically Signed   By: Neita Garnet M.D.   On: 06/21/2023 15:58    CXR:  Cardiomegaly with central pulmonary vascular congestion and central interstitial opacities, findings suggestive of CHF.  CT HEAD 06/14/23 1. No acute finding. 2. Bilateral mastoid and sphenoid sinus opacification.  CT Chest 06/14/23 1. Patchy consolidative airspace disease in the lower lobes bilaterally with patchy and nodular consolidative airspace opacity in both upper lobes and the right middle lobe. Imaging features compatible with multifocal pneumonia. Given the nodular character of disease in some regions, close follow-up suggested and repeat CT chest in 3 months recommended to ensure resolution 2. Trace left pleural effusion. 3. Increased number of lymph nodes in the mediastinum, some of which are mildly enlarged. These are likely reactive. Attention on follow-up recommended. 4. Enlargement of the pulmonary outflow tract/main pulmonary arteries suggests pulmonary arterial hypertension. 5.  Aortic Atherosclerosis (ICD10-I70.0).  Results/Tests Pending at Time of Discharge: None   Discharge Medications:  Allergies as of 06/24/2023       Reactions   Iodinated Contrast Media  Anaphylaxis   Penicillins Anaphylaxis   Patient tolerates cefepime (08/2017)   Insulin Lispro Other (See Comments)   Adder per MAR from SNF   Minoxidil Hives        Medication List     TAKE these medications    albuterol 108 (90 Base) MCG/ACT inhaler Commonly known as: VENTOLIN HFA Inhale 2 puffs into the lungs every 4 (four) hours as needed for wheezing or shortness of breath.   albuterol (2.5 MG/3ML) 0.083% nebulizer solution Commonly known as: PROVENTIL Take 3 mLs (2.5 mg total) by nebulization every 6 (six) hours as needed for wheezing or shortness of breath (I50.32).   amiodarone 200 MG tablet Commonly known as: PACERONE Take 1 tablet (200 mg total) by mouth daily.   atorvastatin 20 MG tablet Commonly known as: LIPITOR Take 20 mg by mouth every evening.   budesonide 0.5 MG/2ML nebulizer solution Commonly known as: PULMICORT Inhale 0.5 mg into the lungs every 12 (twelve) hours.   buPROPion ER 100 MG 12 hr tablet Commonly known as: WELLBUTRIN SR Take 1 tablet (100 mg total) by mouth 2 (two) times daily.   Cholecalciferol 125 MCG (5000 UT) Tabs Take 1 tablet (5,000 Units total) by mouth daily.   Eliquis 5 MG Tabs tablet Generic drug: apixaban Take 5 mg by mouth 2 (two) times daily.   empagliflozin 10 MG Tabs tablet Commonly  known as: JARDIANCE Take 1 tablet (10 mg total) by mouth daily.   ferrous sulfate 325 (65 FE) MG EC tablet Take 325 mg by mouth daily.   fluconazole 150 MG tablet Commonly known as: Diflucan Take 1 tablet (150 mg total) by mouth once a week for 4 doses.   gabapentin 400 MG capsule Commonly known as: NEURONTIN Take 400 mg by mouth 3 (three) times daily.   hydrOXYzine 25 MG tablet Commonly known as: ATARAX Take 1 tablet (25 mg total) by mouth in the morning and at bedtime.   insulin aspart 100 UNIT/ML injection Commonly known as: novoLOG Inject 0-20 Units into the skin 3 (three) times daily with meals.   levothyroxine 150 MCG  tablet Commonly known as: SYNTHROID Take 1 tablet (150 mcg total) by mouth daily at 6 (six) AM. What changed: when to take this   loperamide 2 MG capsule Commonly known as: IMODIUM Take 2 mg by mouth every 4 (four) hours as needed for diarrhea or loose stools.   multivitamin tablet Take 1 tablet by mouth daily.   NON FORMULARY Apply BIPAP every night at bedtime.   nystatin cream Commonly known as: MYCOSTATIN Apply topically 2 (two) times daily.   omeprazole 20 MG capsule Commonly known as: PRILOSEC Take 20 mg by mouth daily.   oxyCODONE-acetaminophen 10-325 MG tablet Commonly known as: PERCOCET Take 1 tablet by mouth every 6 (six) hours.   OXYGEN Inhale 5 L/min into the lungs in the morning and at bedtime. Connect to BIPAP qhs   potassium chloride SA 20 MEQ tablet Commonly known as: KLOR-CON M Take 2 tablets (40 mEq total) by mouth daily.   rOPINIRole 0.5 MG tablet Commonly known as: REQUIP Take 1 tablet (0.5 mg total) by mouth at bedtime.   spironolactone 25 MG tablet Commonly known as: ALDACTONE Take 1 tablet (25 mg total) by mouth daily.   Torsemide 40 MG Tabs Take 40 mg by mouth 2 (two) times daily. What changed: medication strength        Discharge Instructions: Please refer to Patient Instructions section of EMR for full details.  Patient was counseled important signs and symptoms that should prompt return to medical care, changes in medications, dietary instructions, activity restrictions, and follow up appointments.   Follow-Up Appointments:  Follow-up Information     Primary Care Physician. Schedule an appointment as soon as possible for a visit in 1 week(s).                   Alfredo Martinez, MD 06/24/2023, 1:22 PM PGY-3, [facility]

## 2023-06-24 NOTE — Assessment & Plan Note (Deleted)
Continue wound care. ?

## 2023-06-24 NOTE — Plan of Care (Signed)
Problem: Education: Goal: Ability to describe self-care measures that may prevent or decrease complications (Diabetes Survival Skills Education) will improve 06/24/2023 1324 by Kelli Hope, RN Outcome: Adequate for Discharge 06/24/2023 1323 by Kelli Hope, RN Outcome: Adequate for Discharge Goal: Individualized Educational Video(s) 06/24/2023 1324 by Kelli Hope, RN Outcome: Adequate for Discharge 06/24/2023 1323 by Kelli Hope, RN Outcome: Adequate for Discharge   Problem: Coping: Goal: Ability to adjust to condition or change in health will improve 06/24/2023 1324 by Kelli Hope, RN Outcome: Adequate for Discharge 06/24/2023 1323 by Kelli Hope, RN Outcome: Adequate for Discharge   Problem: Fluid Volume: Goal: Ability to maintain a balanced intake and output will improve 06/24/2023 1324 by Kelli Hope, RN Outcome: Adequate for Discharge 06/24/2023 1323 by Kelli Hope, RN Outcome: Adequate for Discharge   Problem: Health Behavior/Discharge Planning: Goal: Ability to identify and utilize available resources and services will improve 06/24/2023 1324 by Kelli Hope, RN Outcome: Adequate for Discharge 06/24/2023 1323 by Kelli Hope, RN Outcome: Adequate for Discharge Goal: Ability to manage health-related needs will improve 06/24/2023 1324 by Kelli Hope, RN Outcome: Adequate for Discharge 06/24/2023 1323 by Kelli Hope, RN Outcome: Adequate for Discharge   Problem: Metabolic: Goal: Ability to maintain appropriate glucose levels will improve 06/24/2023 1324 by Kelli Hope, RN Outcome: Adequate for Discharge 06/24/2023 1323 by Kelli Hope, RN Outcome: Adequate for Discharge   Problem: Nutritional: Goal: Maintenance of adequate nutrition will improve 06/24/2023 1324 by Kelli Hope, RN Outcome: Adequate for Discharge 06/24/2023 1323 by Kelli Hope, RN Outcome: Adequate for Discharge Goal: Progress toward  achieving an optimal weight will improve 06/24/2023 1324 by Kelli Hope, RN Outcome: Adequate for Discharge 06/24/2023 1323 by Kelli Hope, RN Outcome: Adequate for Discharge   Problem: Skin Integrity: Goal: Risk for impaired skin integrity will decrease 06/24/2023 1324 by Kelli Hope, RN Outcome: Adequate for Discharge 06/24/2023 1323 by Kelli Hope, RN Outcome: Adequate for Discharge   Problem: Tissue Perfusion: Goal: Adequacy of tissue perfusion will improve 06/24/2023 1324 by Kelli Hope, RN Outcome: Adequate for Discharge 06/24/2023 1323 by Kelli Hope, RN Outcome: Adequate for Discharge   Problem: Education: Goal: Knowledge of General Education information will improve Description: Including pain rating scale, medication(s)/side effects and non-pharmacologic comfort measures 06/24/2023 1324 by Kelli Hope, RN Outcome: Adequate for Discharge 06/24/2023 1323 by Kelli Hope, RN Outcome: Adequate for Discharge   Problem: Health Behavior/Discharge Planning: Goal: Ability to manage health-related needs will improve 06/24/2023 1324 by Kelli Hope, RN Outcome: Adequate for Discharge 06/24/2023 1323 by Kelli Hope, RN Outcome: Adequate for Discharge   Problem: Clinical Measurements: Goal: Ability to maintain clinical measurements within normal limits will improve 06/24/2023 1324 by Kelli Hope, RN Outcome: Adequate for Discharge 06/24/2023 1323 by Kelli Hope, RN Outcome: Adequate for Discharge Goal: Will remain free from infection 06/24/2023 1324 by Kelli Hope, RN Outcome: Adequate for Discharge 06/24/2023 1323 by Kelli Hope, RN Outcome: Adequate for Discharge Goal: Diagnostic test results will improve 06/24/2023 1324 by Kelli Hope, RN Outcome: Adequate for Discharge 06/24/2023 1323 by Kelli Hope, RN Outcome: Adequate for Discharge Goal: Respiratory complications will improve 06/24/2023 1324 by Kelli Hope, RN Outcome: Adequate for Discharge 06/24/2023 1323 by Kelli Hope, RN Outcome: Adequate for Discharge Goal: Cardiovascular complication will be avoided 06/24/2023 1324 by Kelli Hope, RN Outcome:  Adequate for Discharge 06/24/2023 1323 by Kelli Hope, RN Outcome: Adequate for Discharge   Problem: Activity: Goal: Risk for activity intolerance will decrease 06/24/2023 1324 by Kelli Hope, RN Outcome: Adequate for Discharge 06/24/2023 1323 by Kelli Hope, RN Outcome: Adequate for Discharge   Problem: Nutrition: Goal: Adequate nutrition will be maintained 06/24/2023 1324 by Kelli Hope, RN Outcome: Adequate for Discharge 06/24/2023 1323 by Kelli Hope, RN Outcome: Adequate for Discharge   Problem: Coping: Goal: Level of anxiety will decrease 06/24/2023 1324 by Kelli Hope, RN Outcome: Adequate for Discharge 06/24/2023 1323 by Kelli Hope, RN Outcome: Adequate for Discharge   Problem: Elimination: Goal: Will not experience complications related to bowel motility 06/24/2023 1324 by Kelli Hope, RN Outcome: Adequate for Discharge 06/24/2023 1323 by Kelli Hope, RN Outcome: Adequate for Discharge Goal: Will not experience complications related to urinary retention 06/24/2023 1324 by Kelli Hope, RN Outcome: Adequate for Discharge 06/24/2023 1323 by Kelli Hope, RN Outcome: Adequate for Discharge   Problem: Pain Managment: Goal: General experience of comfort will improve 06/24/2023 1324 by Kelli Hope, RN Outcome: Adequate for Discharge 06/24/2023 1323 by Kelli Hope, RN Outcome: Adequate for Discharge   Problem: Safety: Goal: Ability to remain free from injury will improve 06/24/2023 1324 by Kelli Hope, RN Outcome: Adequate for Discharge 06/24/2023 1323 by Kelli Hope, RN Outcome: Adequate for Discharge   Problem: Skin Integrity: Goal: Risk for impaired skin integrity will  decrease 06/24/2023 1324 by Kelli Hope, RN Outcome: Adequate for Discharge 06/24/2023 1323 by Kelli Hope, RN Outcome: Adequate for Discharge  Kelli Hope, RN

## 2023-08-14 ENCOUNTER — Encounter: Payer: Self-pay | Admitting: Internal Medicine

## 2023-08-14 ENCOUNTER — Inpatient Hospital Stay: Payer: Medicare Other | Admitting: Internal Medicine

## 2023-08-15 ENCOUNTER — Encounter (HOSPITAL_COMMUNITY): Payer: Self-pay

## 2023-08-15 ENCOUNTER — Emergency Department (HOSPITAL_COMMUNITY): Payer: Medicare Other

## 2023-08-15 ENCOUNTER — Inpatient Hospital Stay (HOSPITAL_COMMUNITY)
Admission: EM | Admit: 2023-08-15 | Discharge: 2023-08-21 | DRG: 291 | Disposition: A | Discharge status: Skilled Nursing Facility | Payer: Medicare Other | Attending: Family Medicine | Admitting: Family Medicine

## 2023-08-15 ENCOUNTER — Other Ambulatory Visit: Payer: Self-pay

## 2023-08-15 DIAGNOSIS — E119 Type 2 diabetes mellitus without complications: Secondary | ICD-10-CM

## 2023-08-15 DIAGNOSIS — J9622 Acute and chronic respiratory failure with hypercapnia: Secondary | ICD-10-CM | POA: Diagnosis present

## 2023-08-15 DIAGNOSIS — E039 Hypothyroidism, unspecified: Secondary | ICD-10-CM | POA: Diagnosis present

## 2023-08-15 DIAGNOSIS — Z91199 Patient's noncompliance with other medical treatment and regimen due to unspecified reason: Secondary | ICD-10-CM

## 2023-08-15 DIAGNOSIS — I110 Hypertensive heart disease with heart failure: Principal | ICD-10-CM | POA: Diagnosis present

## 2023-08-15 DIAGNOSIS — B3732 Chronic candidiasis of vulva and vagina: Secondary | ICD-10-CM | POA: Diagnosis present

## 2023-08-15 DIAGNOSIS — Z6841 Body Mass Index (BMI) 40.0 and over, adult: Secondary | ICD-10-CM

## 2023-08-15 DIAGNOSIS — F411 Generalized anxiety disorder: Secondary | ICD-10-CM | POA: Diagnosis present

## 2023-08-15 DIAGNOSIS — G2581 Restless legs syndrome: Secondary | ICD-10-CM | POA: Diagnosis present

## 2023-08-15 DIAGNOSIS — Z888 Allergy status to other drugs, medicaments and biological substances status: Secondary | ICD-10-CM

## 2023-08-15 DIAGNOSIS — R011 Cardiac murmur, unspecified: Secondary | ICD-10-CM | POA: Diagnosis present

## 2023-08-15 DIAGNOSIS — Z8249 Family history of ischemic heart disease and other diseases of the circulatory system: Secondary | ICD-10-CM

## 2023-08-15 DIAGNOSIS — I2510 Atherosclerotic heart disease of native coronary artery without angina pectoris: Secondary | ICD-10-CM | POA: Diagnosis present

## 2023-08-15 DIAGNOSIS — J441 Chronic obstructive pulmonary disease with (acute) exacerbation: Secondary | ICD-10-CM | POA: Diagnosis present

## 2023-08-15 DIAGNOSIS — J9601 Acute respiratory failure with hypoxia: Secondary | ICD-10-CM | POA: Diagnosis present

## 2023-08-15 DIAGNOSIS — Z9889 Other specified postprocedural states: Secondary | ICD-10-CM

## 2023-08-15 DIAGNOSIS — Z7901 Long term (current) use of anticoagulants: Secondary | ICD-10-CM

## 2023-08-15 DIAGNOSIS — Z880 Allergy status to penicillin: Secondary | ICD-10-CM

## 2023-08-15 DIAGNOSIS — G8929 Other chronic pain: Secondary | ICD-10-CM | POA: Diagnosis present

## 2023-08-15 DIAGNOSIS — Z7951 Long term (current) use of inhaled steroids: Secondary | ICD-10-CM

## 2023-08-15 DIAGNOSIS — K219 Gastro-esophageal reflux disease without esophagitis: Secondary | ICD-10-CM | POA: Diagnosis present

## 2023-08-15 DIAGNOSIS — K5900 Constipation, unspecified: Secondary | ICD-10-CM | POA: Diagnosis not present

## 2023-08-15 DIAGNOSIS — E876 Hypokalemia: Secondary | ICD-10-CM | POA: Diagnosis present

## 2023-08-15 DIAGNOSIS — B379 Candidiasis, unspecified: Secondary | ICD-10-CM | POA: Insufficient documentation

## 2023-08-15 DIAGNOSIS — I878 Other specified disorders of veins: Secondary | ICD-10-CM | POA: Diagnosis present

## 2023-08-15 DIAGNOSIS — D509 Iron deficiency anemia, unspecified: Secondary | ICD-10-CM | POA: Diagnosis present

## 2023-08-15 DIAGNOSIS — Z9049 Acquired absence of other specified parts of digestive tract: Secondary | ICD-10-CM

## 2023-08-15 DIAGNOSIS — R0689 Other abnormalities of breathing: Secondary | ICD-10-CM | POA: Diagnosis present

## 2023-08-15 DIAGNOSIS — I5033 Acute on chronic diastolic (congestive) heart failure: Secondary | ICD-10-CM | POA: Diagnosis present

## 2023-08-15 DIAGNOSIS — E1165 Type 2 diabetes mellitus with hyperglycemia: Secondary | ICD-10-CM | POA: Diagnosis not present

## 2023-08-15 DIAGNOSIS — E1110 Type 2 diabetes mellitus with ketoacidosis without coma: Secondary | ICD-10-CM | POA: Diagnosis present

## 2023-08-15 DIAGNOSIS — E662 Morbid (severe) obesity with alveolar hypoventilation: Secondary | ICD-10-CM | POA: Diagnosis present

## 2023-08-15 DIAGNOSIS — Z7984 Long term (current) use of oral hypoglycemic drugs: Secondary | ICD-10-CM

## 2023-08-15 DIAGNOSIS — N898 Other specified noninflammatory disorders of vagina: Secondary | ICD-10-CM | POA: Diagnosis present

## 2023-08-15 DIAGNOSIS — J9621 Acute and chronic respiratory failure with hypoxia: Secondary | ICD-10-CM | POA: Diagnosis present

## 2023-08-15 DIAGNOSIS — Z90710 Acquired absence of both cervix and uterus: Secondary | ICD-10-CM

## 2023-08-15 DIAGNOSIS — Z79891 Long term (current) use of opiate analgesic: Secondary | ICD-10-CM

## 2023-08-15 DIAGNOSIS — Z794 Long term (current) use of insulin: Secondary | ICD-10-CM | POA: Diagnosis not present

## 2023-08-15 DIAGNOSIS — Z8541 Personal history of malignant neoplasm of cervix uteri: Secondary | ICD-10-CM

## 2023-08-15 DIAGNOSIS — D649 Anemia, unspecified: Secondary | ICD-10-CM | POA: Diagnosis not present

## 2023-08-15 DIAGNOSIS — I509 Heart failure, unspecified: Secondary | ICD-10-CM

## 2023-08-15 DIAGNOSIS — Z87892 Personal history of anaphylaxis: Secondary | ICD-10-CM

## 2023-08-15 DIAGNOSIS — E1140 Type 2 diabetes mellitus with diabetic neuropathy, unspecified: Secondary | ICD-10-CM

## 2023-08-15 DIAGNOSIS — E785 Hyperlipidemia, unspecified: Secondary | ICD-10-CM | POA: Diagnosis present

## 2023-08-15 DIAGNOSIS — R7989 Other specified abnormal findings of blood chemistry: Secondary | ICD-10-CM | POA: Insufficient documentation

## 2023-08-15 DIAGNOSIS — N179 Acute kidney failure, unspecified: Secondary | ICD-10-CM | POA: Diagnosis not present

## 2023-08-15 DIAGNOSIS — I4892 Unspecified atrial flutter: Secondary | ICD-10-CM | POA: Diagnosis present

## 2023-08-15 DIAGNOSIS — Z79899 Other long term (current) drug therapy: Secondary | ICD-10-CM

## 2023-08-15 DIAGNOSIS — I480 Paroxysmal atrial fibrillation: Secondary | ICD-10-CM | POA: Diagnosis present

## 2023-08-15 DIAGNOSIS — Z8742 Personal history of other diseases of the female genital tract: Secondary | ICD-10-CM

## 2023-08-15 DIAGNOSIS — Z91041 Radiographic dye allergy status: Secondary | ICD-10-CM

## 2023-08-15 DIAGNOSIS — Z79890 Hormone replacement therapy (postmenopausal): Secondary | ICD-10-CM

## 2023-08-15 DIAGNOSIS — R0902 Hypoxemia: Principal | ICD-10-CM

## 2023-08-15 LAB — GLUCOSE, CAPILLARY
Glucose-Capillary: 218 mg/dL — ABNORMAL HIGH (ref 70–99)
Glucose-Capillary: 189 mg/dL — ABNORMAL HIGH (ref 70–99)

## 2023-08-15 LAB — BASIC METABOLIC PANEL
Anion gap: 14 (ref 5–15)
BUN: 32 mg/dL — ABNORMAL HIGH (ref 6–20)
CO2: 45 mmol/L — ABNORMAL HIGH (ref 22–32)
Calcium: 8.6 mg/dL — ABNORMAL LOW (ref 8.9–10.3)
Chloride: 82 mmol/L — ABNORMAL LOW (ref 98–111)
Creatinine, Ser: 0.86 mg/dL (ref 0.44–1.00)
GFR, Estimated: >60 mL/min (ref >60)
Glucose, Bld: 314 mg/dL — ABNORMAL HIGH (ref 70–99)
Potassium: 3.2 mmol/L — ABNORMAL LOW (ref 3.5–5.1)
Sodium: 141 mmol/L (ref 135–145)

## 2023-08-15 LAB — BLOOD GAS, VENOUS
Acid-Base Excess: 35.6 mmol/L — ABNORMAL HIGH (ref 0.0–2.0)
Bicarbonate: 67.4 mmol/L — ABNORMAL HIGH (ref 20.0–28.0)
O2 Saturation: 65 %
Patient temperature: 37
pCO2, Ven: 97 mm[Hg] (ref 44–60)
pH, Ven: 7.45 — ABNORMAL HIGH (ref 7.25–7.43)
pO2, Ven: 38 mm[Hg] (ref 32–45)

## 2023-08-15 LAB — CBC WITH DIFFERENTIAL/PLATELET
Abs Immature Granulocytes: 0.06 10*3/uL (ref 0.00–0.07)
Basophils Absolute: 0.1 10*3/uL (ref 0.0–0.1)
Basophils Relative: 1 %
Eosinophils Absolute: 0.1 10*3/uL (ref 0.0–0.5)
Eosinophils Relative: 1 %
HCT: 32.5 % — ABNORMAL LOW (ref 36.0–46.0)
Hemoglobin: 9.4 g/dL — ABNORMAL LOW (ref 12.0–15.0)
Immature Granulocytes: 1 %
Lymphocytes Relative: 14 %
Lymphs Abs: 1.2 10*3/uL (ref 0.7–4.0)
MCH: 28.1 pg (ref 26.0–34.0)
MCHC: 28.9 g/dL — ABNORMAL LOW (ref 30.0–36.0)
MCV: 97 fL (ref 80.0–100.0)
Monocytes Absolute: 0.9 10*3/uL (ref 0.1–1.0)
Monocytes Relative: 10 %
Neutro Abs: 6.5 10*3/uL (ref 1.7–7.7)
Neutrophils Relative %: 73 %
Platelets: 261 10*3/uL (ref 150–400)
RBC: 3.35 MIL/uL — ABNORMAL LOW (ref 3.87–5.11)
RDW: 13.9 % (ref 11.5–15.5)
WBC: 8.8 10*3/uL (ref 4.0–10.5)
nRBC: 0 % (ref 0.0–0.2)

## 2023-08-15 LAB — I-STAT ARTERIAL BLOOD GAS, ED
Acid-Base Excess: 23 mmol/L — ABNORMAL HIGH (ref 0.0–2.0)
Bicarbonate: 53.6 mmol/L — ABNORMAL HIGH (ref 20.0–28.0)
Calcium, Ion: 1.13 mmol/L — ABNORMAL LOW (ref 1.15–1.40)
HCT: 31 % — ABNORMAL LOW (ref 36.0–46.0)
Hemoglobin: 10.5 g/dL — ABNORMAL LOW (ref 12.0–15.0)
O2 Saturation: 84 %
Patient temperature: 97.7
Potassium: 3 mmol/L — ABNORMAL LOW (ref 3.5–5.1)
Sodium: 138 mmol/L (ref 135–145)
TCO2: >50 mmol/L — ABNORMAL HIGH (ref 22–32)
pCO2 arterial: 96.6 mm[Hg] (ref 32–48)
pH, Arterial: 7.349 — ABNORMAL LOW (ref 7.35–7.45)
pO2, Arterial: 54 mm[Hg] — ABNORMAL LOW (ref 83–108)

## 2023-08-15 LAB — BRAIN NATRIURETIC PEPTIDE: B Natriuretic Peptide: 243.8 pg/mL — ABNORMAL HIGH (ref 0.0–100.0)

## 2023-08-15 LAB — BETA-HYDROXYBUTYRIC ACID: Beta-Hydroxybutyric Acid: 0.16 mmol/L (ref 0.05–0.27)

## 2023-08-15 LAB — TSH: TSH: 0.654 u[IU]/mL (ref 0.350–4.500)

## 2023-08-15 LAB — MRSA NEXT GEN BY PCR, NASAL: MRSA by PCR Next Gen: NOT DETECTED

## 2023-08-15 MED ORDER — FUROSEMIDE 10 MG/ML IJ SOLN
40.0000 mg | Freq: Once | INTRAMUSCULAR | Status: AC
  Administered 2023-08-15: 40 mg via INTRAVENOUS
  Filled 2023-08-15: qty 4

## 2023-08-15 MED ORDER — BUPROPION HCL ER (SR) 100 MG PO TB12
100.0000 mg | ORAL_TABLET | Freq: Two times a day (BID) | ORAL | Status: DC
  Administered 2023-08-15 – 2023-08-21 (×12): 100 mg via ORAL
  Filled 2023-08-15 (×13): qty 1

## 2023-08-15 MED ORDER — POTASSIUM CHLORIDE CRYS ER 20 MEQ PO TBCR
40.0000 meq | EXTENDED_RELEASE_TABLET | Freq: Once | ORAL | Status: AC
  Administered 2023-08-15: 40 meq via ORAL
  Filled 2023-08-15: qty 2

## 2023-08-15 MED ORDER — ACETAMINOPHEN 650 MG RE SUPP: 650.0000 mg | Freq: Four times a day (QID) | RECTAL | Status: DC | PRN

## 2023-08-15 MED ORDER — INSULIN ASPART 100 UNIT/ML IJ SOLN
0.0000 [IU] | Freq: Three times a day (TID) | INTRAMUSCULAR | Status: DC
  Administered 2023-08-15: 5 [IU] via SUBCUTANEOUS
  Administered 2023-08-16: 3 [IU] via SUBCUTANEOUS
  Administered 2023-08-16: 5 [IU] via SUBCUTANEOUS
  Administered 2023-08-16: 3 [IU] via SUBCUTANEOUS
  Administered 2023-08-17: 5 [IU] via SUBCUTANEOUS

## 2023-08-15 MED ORDER — ROPINIROLE HCL 1 MG PO TABS
0.5000 mg | ORAL_TABLET | Freq: Every day | ORAL | Status: DC
  Administered 2023-08-15 – 2023-08-20 (×6): 0.5 mg via ORAL
  Filled 2023-08-15 (×6): qty 1

## 2023-08-15 MED ORDER — MONTELUKAST SODIUM 10 MG PO TABS
10.0000 mg | ORAL_TABLET | Freq: Every day | ORAL | Status: DC
  Administered 2023-08-15 – 2023-08-20 (×6): 10 mg via ORAL
  Filled 2023-08-15 (×6): qty 1

## 2023-08-15 MED ORDER — APIXABAN 5 MG PO TABS
5.0000 mg | ORAL_TABLET | Freq: Two times a day (BID) | ORAL | Status: DC
  Administered 2023-08-15 – 2023-08-21 (×12): 5 mg via ORAL
  Filled 2023-08-15 (×12): qty 1

## 2023-08-15 MED ORDER — PANTOPRAZOLE SODIUM 40 MG PO TBEC
40.0000 mg | DELAYED_RELEASE_TABLET | Freq: Every day | ORAL | Status: DC
Start: 2023-08-16 — End: 2023-08-21
  Administered 2023-08-16 – 2023-08-21 (×6): 40 mg via ORAL
  Filled 2023-08-15 (×6): qty 1

## 2023-08-15 MED ORDER — POTASSIUM CHLORIDE 10 MEQ/100ML IV SOLN: 10.0000 meq | INTRAVENOUS | Status: AC

## 2023-08-15 MED ORDER — ALBUTEROL SULFATE (2.5 MG/3ML) 0.083% IN NEBU: 2.5000 mg | INHALATION_SOLUTION | Freq: Four times a day (QID) | RESPIRATORY_TRACT | Status: DC | PRN

## 2023-08-15 MED ORDER — ATORVASTATIN CALCIUM 10 MG PO TABS
20.0000 mg | ORAL_TABLET | Freq: Every evening | ORAL | Status: DC
  Administered 2023-08-15 – 2023-08-20 (×6): 20 mg via ORAL
  Filled 2023-08-15 (×6): qty 2

## 2023-08-15 MED ORDER — LEVOTHYROXINE SODIUM 75 MCG PO TABS
150.0000 ug | ORAL_TABLET | ORAL | Status: DC
  Administered 2023-08-16 – 2023-08-21 (×6): 150 ug via ORAL
  Filled 2023-08-15 (×6): qty 2

## 2023-08-15 MED ORDER — POLYETHYLENE GLYCOL 3350 17 G PO PACK: 17.0000 g | PACK | Freq: Every day | ORAL | Status: DC | PRN

## 2023-08-15 MED ORDER — GABAPENTIN 400 MG PO CAPS
400.0000 mg | ORAL_CAPSULE | Freq: Three times a day (TID) | ORAL | Status: DC
  Administered 2023-08-15 – 2023-08-21 (×18): 400 mg via ORAL
  Filled 2023-08-15 (×18): qty 1

## 2023-08-15 MED ORDER — BUDESONIDE 0.5 MG/2ML IN SUSP
0.5000 mg | Freq: Two times a day (BID) | RESPIRATORY_TRACT | Status: DC
  Administered 2023-08-16 – 2023-08-20 (×10): 0.5 mg via RESPIRATORY_TRACT
  Filled 2023-08-15 (×12): qty 2

## 2023-08-15 MED ORDER — ACETAMINOPHEN 325 MG PO TABS
650.0000 mg | ORAL_TABLET | Freq: Four times a day (QID) | ORAL | Status: DC | PRN
  Administered 2023-08-16: 650 mg via ORAL
  Filled 2023-08-15: qty 2

## 2023-08-15 MED ORDER — SPIRONOLACTONE 25 MG PO TABS
25.0000 mg | ORAL_TABLET | Freq: Every day | ORAL | Status: DC
  Administered 2023-08-15 – 2023-08-21 (×7): 25 mg via ORAL
  Filled 2023-08-15 (×2): qty 1
  Filled 2023-08-15: qty 2
  Filled 2023-08-15 (×5): qty 1

## 2023-08-15 MED ORDER — POTASSIUM CHLORIDE 10 MEQ/100ML IV SOLN
10.0000 meq | INTRAVENOUS | Status: DC
  Filled 2023-08-15: qty 100

## 2023-08-15 MED ORDER — EMPAGLIFLOZIN 10 MG PO TABS
10.0000 mg | ORAL_TABLET | Freq: Every day | ORAL | Status: DC
  Administered 2023-08-15 – 2023-08-16 (×2): 10 mg via ORAL
  Filled 2023-08-15 (×2): qty 1

## 2023-08-15 NOTE — Assessment & Plan Note (Addendum)
Poorly controlled. Hyperglycemic to 300s on arrival. Last A1c of 8.5 in Aug 2024. Home regimen includes Jardiance daily. Previously prescribed insulin, but patient has not been using.  - continue home Jardiance 10 daily  - mSSI + CBGs AC & at bedtime - AM A1c

## 2023-08-15 NOTE — H&P (Signed)
57 Y/O F with PMHx of CHF, hypothyroidism, COPD, CAD, DM2, HLD, HTN, OSA, PAF presents with SOB and worsening O2 requirement She denies cough or chest congestion.   Exam: Gen: No distress. On BiPAP HEENT: EOMI, PERRLA Neuro: Grossly intact  Heart: 2/6 systolic murmur on her LUSB Pulm: Air entry reduced B/L. No wheezing Abd: Soft, NT/ND, BS+ Ext:  A/P: Acute on chronic hypoxic and hypercarbic respiratory failure Likely multifactorial in the settings of poor compliance with CPAP and CHF exacerbation, less likely COPD exacerbation Chest x-ray showed pulmonary edema Blood gas showed pCO2 of 96.6 She is currently on BiPAP recheck ABG in 4 hours Wean to home O2 regimen as tolerated Consider CT chest if there is no improvement despite treatment  Diastolic CHF exacerbation: BNP 243 Recent ECHO showed an EF of 60 - 65% with normal diastolic parameters She is on home Torsemide 60 mg BID It seems she received IV Lasix 40 mg x 1 in the ED already Might need to re-dose her since given that this is less than her home regimen. Reassess her volume status and re-dose Lasix as needed Monitor I/Os closely Monitor Kidney functions closely Resume home Torsemide as soon as volume status is baseline  COPD: Resume home regimen She denies cough when I examined her Resume home regimen Monitor O2 requirements closely  DM2 with hyperglycemia: Her home regimen includes insulin SSI and Jardiance 10 Unclear about med compliance pH is slightly low, anion gap 14 Note that there is a documented hx of insulin allergy, although she has received insulin on multiple admissions. The resident will review this with the pharmacy team. I suspect she can get insulin with no issue. Check Betahydroxybutyric acid - if elevated, consider insulin drip for DKA, especially if glucose remains elevated. Otherwise, if there is an improvement, may resume home Jardiance with SSI coverage. Monitor glucose  closely   Hypokalemia: Replete and recheck Keep potassium level between 4 - 5  Check Mg level as well.  I will cosign the resident's note once it is completed.

## 2023-08-15 NOTE — ED Provider Notes (Signed)
Chelsea [name]   CSN: [id number] Arrival date & time: 08/15/23  1112     History  Chief Complaint  Patient presents with   Shortness of Breath   Fatigue    Chelsea [name] is a 57 y.o. female.  57 year old female with past medical history of morbid obesity, diabetes, CHF, and atrial flutter presenting to the emergency department today with concern for increasing oxygen requirements and confusion earlier.  The nursing staff at the patient's nursing facility told medics that the patient was satting in the 50s initially this morning.  They report that she has been noncompliant with her BiPAP there.  When medics arrived the patient has been stable on a nonrebreather.  They state that when they moved her to the stretcher that the lowest she went was 87%.  The patient denies any complaints currently.  She is alert and orient x 3.   Shortness of Breath      Home Medications Prior to Admission medications   Medication Sig Start Date End Date Taking? Authorizing Provider  albuterol (PROVENTIL) (2.5 MG/3ML) 0.083% nebulizer solution Take 3 mLs (2.5 mg total) by nebulization every 6 (six) hours as needed for wheezing or shortness of breath (I50.32). 07/04/16  Yes Oretha Milch, MD  ALPRAZolam Prudy Feeler) 0.25 MG tablet Take 0.25 mg by mouth every 8 (eight) hours as needed for anxiety. 07/03/23  Yes [provider]  ammonium lactate (LAC-HYDRIN) 12 % lotion Apply 1 Application topically at bedtime.   Yes [provider]  atorvastatin (LIPITOR) 20 MG tablet Take 20 mg by mouth every evening.   Yes [provider]  budesonide (PULMICORT) 0.5 MG/2ML nebulizer solution Inhale 0.5 mg into the lungs every 12 (twelve) hours.   Yes [provider]  buPROPion ER (WELLBUTRIN SR) 100 MG 12 hr tablet Take 1 tablet (100 mg total) by mouth 2 (two) times daily. 06/08/23  Yes Alfredo Martinez, MD  Cholecalciferol 125 MCG  (5000 UT) TABS Take 1 tablet (5,000 Units total) by mouth daily. 07/10/22  Yes [name], Werner Lean, MD  ELIQUIS 5 MG TABS tablet Take 5 mg by mouth 2 (two) times daily. 04/12/21  Yes [provider]  empagliflozin (JARDIANCE) 10 MG TABS tablet Take 1 tablet (10 mg total) by mouth daily. 10/03/22  Yes Elgergawy, Leana Roe, MD  ferrous sulfate 325 (65 FE) MG EC tablet Take 325 mg by mouth daily.   Yes [provider]  gabapentin (NEURONTIN) 400 MG capsule Take 400 mg by mouth 3 (three) times daily.   Yes [provider]  levothyroxine (SYNTHROID) 150 MCG tablet Take 1 tablet (150 mcg total) by mouth daily at 6 (six) AM. Patient taking differently: Take 150 mcg by mouth daily. 07/10/22  Yes [name], Werner Lean, MD  metolazone (ZAROXOLYN) 2.5 MG tablet Take 2.5 mg by mouth every other day.   Yes [provider]  montelukast (SINGULAIR) 10 MG tablet Take 10 mg by mouth at bedtime.   Yes [provider]  omeprazole (PRILOSEC) 20 MG capsule Take 20 mg by mouth daily. 10/02/22  Yes [provider]  oxyCODONE-acetaminophen (PERCOCET) 10-325 MG tablet Take 1 tablet by mouth every 6 (six) hours. 06/21/23  Yes Maxwell, Allee, MD  potassium chloride SA (KLOR-CON M) 20 MEQ tablet Take 2 tablets (40 mEq total) by mouth daily. 06/21/23  Yes Alfredo Martinez, MD  rOPINIRole (REQUIP) 0.5 MG tablet Take 1 tablet (0.5 mg total) by mouth at  bedtime. 06/21/23  Yes Alfredo Martinez, MD  spironolactone (ALDACTONE) 25 MG tablet Take 1 tablet (25 mg total) by mouth daily. 10/17/22  Yes Sharol Harness, Brittainy M, PA-C  torsemide (DEMADEX) 20 MG tablet Take 60 mg by mouth 2 (two) times daily.   Yes [provider]  albuterol (VENTOLIN HFA) 108 (90 Base) MCG/ACT inhaler Inhale 2 puffs into the lungs every 4 (four) hours as needed for wheezing or shortness of breath. Patient not taking: Reported on 08/15/2023    [provider]  amiodarone (PACERONE) 200 MG tablet Take 1 tablet  (200 mg total) by mouth daily. Patient not taking: Reported on 08/15/2023 06/21/23   Alfredo Martinez, MD  hydrOXYzine (ATARAX) 25 MG tablet Take 1 tablet (25 mg total) by mouth in the morning and at bedtime. Patient not taking: Reported on 08/15/2023 06/08/23   Alfredo Martinez, MD  insulin aspart (NOVOLOG) 100 UNIT/ML injection Inject 0-20 Units into the skin 3 (three) times daily with meals. Patient not taking: Reported on 08/15/2023 06/24/23   Alfredo Martinez, MD  loperamide (IMODIUM) 2 MG capsule Take 2 mg by mouth every 4 (four) hours as needed for diarrhea or loose stools. Patient not taking: Reported on 08/15/2023    [provider]  Multiple Vitamin (MULTIVITAMIN) tablet Take 1 tablet by mouth daily. Patient not taking: Reported on 08/15/2023    [provider]  NON FORMULARY Apply BIPAP every night at bedtime.    [provider]  nystatin cream (MYCOSTATIN) Apply topically 2 (two) times daily. Patient not taking: Reported on 08/15/2023 06/24/23   Alfredo Martinez, MD  OXYGEN Inhale 5 L/min into the lungs in the morning and at bedtime. Connect to BIPAP qhs    [provider]      Allergies    Iodinated contrast media, Penicillins, Insulin lispro, and Minoxidil    Review of Systems   Review of Systems  Respiratory:  Positive for shortness of breath.   All other systems reviewed and are negative.   Physical Exam Updated Vital Signs BP 122/78   Pulse 83   Temp 97.7 F (36.5 C) (Oral)   Resp 20   SpO2 100%  Physical Exam Vitals and nursing [name] reviewed.   Gen: Morbidly obese, chronically ill-appearing, no acute distress Eyes: PERRL, EOMI HEENT: no oropharyngeal swelling Neck: trachea midline Resp: clear to auscultation bilaterally, diminished at bilateral lung bases Card: RRR, no murmurs, rubs, or gallops Abd: nontender, nondistended Extremities: no calf tenderness, 1+ edema bilaterally Vascular: 2+ radial pulses bilaterally, 2+ DP pulses  bilaterally Neuro: Alert and oriented x 3, no focal deficits noted Skin: no rashes Psyc: acting appropriately   ED Results / Procedures / Treatments   Labs (all labs ordered are listed, but only abnormal results are displayed) Labs Reviewed  BASIC METABOLIC PANEL - Abnormal; Notable for the following components:      Result Value   Potassium 3.2 (*)    Chloride 82 (*)    CO2 45 (*)    Glucose, Bld 314 (*)    BUN 32 (*)    Calcium 8.6 (*)    All other components within normal limits  CBC WITH DIFFERENTIAL/PLATELET - Abnormal; Notable for the following components:   RBC 3.35 (*)    Hemoglobin 9.4 (*)    HCT 32.5 (*)    MCHC 28.9 (*)    All other components within normal limits  BRAIN NATRIURETIC PEPTIDE - Abnormal; Notable for the following components:   B Natriuretic Peptide 243.8 (*)  All other components within normal limits  I-STAT ARTERIAL BLOOD GAS, ED - Abnormal; Notable for the following components:   pH, Arterial 7.349 (*)    pCO2 arterial 96.6 (*)    pO2, Arterial 54 (*)    Bicarbonate 53.6 (*)    TCO2 >50 (*)    Acid-Base Excess 23.0 (*)    Potassium 3.0 (*)    Calcium, Ion 1.13 (*)    HCT 31.0 (*)    Hemoglobin 10.5 (*)    All other components within normal limits  BLOOD GAS, ARTERIAL  BLOOD GAS, ARTERIAL    EKG EKG Interpretation Date/Time:  Thursday August 15 2023 11:33:17 EST Ventricular Rate:  79 PR Interval:  233 QRS Duration:  105 QT Interval:  330 QTC Calculation: 379 R Axis:   75  Text Interpretation: Sinus rhythm Prolonged PR interval Repol abnrm suggests ischemia, diffuse leads Confirmed by Beckey Downing [phone]) on 08/15/2023 12:34:33 PM  Radiology DG Chest Port 1 View  Result Date: 08/15/2023 CLINICAL DATA:  Shortness of breath. EXAM: PORTABLE CHEST 1 VIEW COMPARISON:  06/16/2023. FINDINGS: Low lung volume. There is moderate diffuse pulmonary edema. Redemonstration of left retrocardiac airspace opacity obscuring the left  hemidiaphragm, descending thoracic aorta and blunting the left lateral costophrenic angle suggesting combination of left lower lobe atelectasis and/or consolidation with pleural effusion. Bilateral lung fields are otherwise clear of dense consolidation or lung collapse. There is subtle blunting of right lateral costophrenic angle, which may represent small right pleural effusion. Stable moderately enlarged cardio-mediastinal silhouette. No acute osseous abnormalities. The soft tissues are within normal limits. IMPRESSION: *Findings compatible with congestive heart failure/pulmonary edema. Additional left retrocardiac opacity and bilateral pleural effusions, as described above. Electronically Signed   By: Jules Schick M.D.   On: 08/15/2023 14:51    Procedures Procedures    Medications Ordered in ED Medications  furosemide (LASIX) injection 40 mg (40 mg Intravenous Given 08/15/23 1351)  potassium chloride SA (KLOR-CON M) CR tablet 40 mEq (40 mEq Oral Given 08/15/23 1351)    ED Course/ Medical Decision Making/ A&P                                 Medical Decision Making 57 year old female with past medical history of morbid obesity, CHF, and atrial flutter presents the emergency department today with concern for confusion and hypoxia earlier.  The patient is normally on 8 L nasal cannula at baseline.  Will try the patient on 8 L to see how she does.  Will obtain basic labs here as well as a chest x-ray and BNP to evaluate for pulmonary edema, pulmonary infiltrates, pneumothorax.  Also obtain an ABG on the patient to evaluate for hypercarbia.  The patient is alert and oriented here.  Her vital signs are reassuring.  I will reevaluate for ultimate disposition.  We did try the patient on nasal cannula and her pulse ox dropped to the 60s.  Looking back through her chart it appears that she has had issues with this in the past with noncompliance with BiPAP.  Will place patient on BiPAP here.  The  patient was found to be hypercarbic here.  She was placed on BiPAP with improvement.  Calls placed to family medicine service for admission.  Her chest x-ray interpreted by me does show some pulmonary edema so she is given dose of Lasix here as well.  Critical care time 38 minutes including reassessments, review  of old records, treatment patient with hypercarbia and hypoxia with BiPAP  Amount and/or Complexity of Data Reviewed Labs: ordered. Radiology: ordered.  Risk Prescription drug management. Decision regarding hospitalization.           Final Clinical Impression(s) / ED Diagnoses Final diagnoses:  Hypoxia  Hypercapnemia    Rx / DC Orders ED Discharge Orders     None         Durwin Glaze, MD 08/15/23 1531

## 2023-08-15 NOTE — Assessment & Plan Note (Addendum)
Patient with chronic HF found to be fluid overloaded on exam and initial workup. BNP elevated to 243.8. CXR with pulmonary edema and bilateral pleural effusions. S/p 40 IV lasix in ED. Home diuretics include torsemide 40 BID, spironolactone 25 daily, metolazone 2.5 every other day.  - Additional 40 IV lasix ordered - Continue home spironolactone 25 daily - Holding home torsemide, metolazone - restart as needed/tolerated  - Strict I/Os - Daily weights  - AM BMP, Mg - replete electrolytes as needed for K >4, Mg > 2 - Consider HF consults, given previous admissions

## 2023-08-15 NOTE — Progress Notes (Signed)
   08/15/23 2153  Provider Notification  Provider Name/Title Everhart DO/Norbert MD  Date Provider Notified 08/15/23  Time Provider Notified 2152  Method of Notification Page  Notification Reason Critical Result  Test performed and critical result CO2 97  Date Critical Result Received 08/15/23  Time Critical Result Received 2150  Provider response No new orders  Date of Provider Response 08/15/23  Time of Provider Response 2153

## 2023-08-15 NOTE — ED Notes (Signed)
ED TO INPATIENT HANDOFF REPORT  ED Nurse Name and Phone #: Gustavo Lah, rn [phone]   S Name/Age/Gender Chelsea [name] 57 y.o. female Room/Bed: 018C/018C  Code Status   Code Status: Full Code  Home/SNF/Other Skilled nursing facility Patient oriented to: self, place, time, and situation Is this baseline? Yes   Triage Complete: Triage complete  Chief Complaint Acute hypoxic respiratory failure (HCC) [J96.01]  Triage Note No notes on file   Allergies Allergies  Allergen Reactions   Iodinated Contrast Media Anaphylaxis   Penicillins Anaphylaxis    Patient tolerates cefepime (08/2017)   Insulin Lispro Other (See Comments)    Adder per MAR from SNF   Minoxidil Hives    Level of Care/Admitting Diagnosis ED Disposition     ED Disposition  Admit   Condition  --   Comment  Hospital Area: [facility] [[id number]]  Level of Care: Progressive [102]  Admit to Progressive based on following criteria: CARDIOVASCULAR & THORACIC of moderate stability with acute coronary syndrome symptoms/low risk myocardial infarction/hypertensive urgency/arrhythmias/heart failure potentially compromising stability and stable post cardiovascular intervention patients.  Admit to Progressive based on following criteria: RESPIRATORY PROBLEMS hypoxemic/hypercapnic respiratory failure that is responsive to NIPPV (BiPAP) or High Flow Nasal Cannula (6-80 lpm). Frequent assessment/intervention, no > Q2 hrs < Q4 hrs, to maintain oxygenation and pulmonary hygiene.  May admit patient to [facility] or [facility] if equivalent level of care is available:: No  Covid Evaluation: Asymptomatic - no recent exposure (last 10 days) testing not required  Diagnosis: Acute hypoxic respiratory failure [facility]) [[id number]]  Admitting Physician: [name] [[id number]]  Attending Physician: Doreene Eland [[id number]]  Certification:: I certify this patient will need inpatient services for at least 2 midnights   Expected Medical Readiness: 08/19/2023          B Medical/Surgery History Past Medical History:  Diagnosis Date   Acquired hypothyroidism 11/13/2010   Qualifier: Diagnosis of   By: Jens Som, MD, Lyn Hollingshead      Acute congestive heart failure (HCC) 03/18/2021   Acute idiopathic gout of left hand 02/16/2019   Acute kidney injury (HCC) 05/29/2023   Acute on chronic diastolic CHF (congestive heart failure) (HCC) 09/25/2017   Acute on chronic diastolic CHF (congestive heart failure), NYHA class 3 (HCC) 09/25/2017   Acute on chronic hypoxic respiratory failure (HCC) 07/04/2022   Acute on chronic respiratory failure with hypoxia (HCC) 09/17/2022   Acute on chronic respiratory failure with hypoxia and hypercapnia (HCC) 04/07/2022   Acute respiratory failure with hypoxia and hypercarbia (HCC) 05/27/2016   Arthritis    "qwhere" (12/05/2017)   Asthma    Atrial flutter (HCC) 03/09/2021   Bleeding of the respiratory tract 04/07/2022   Bronchitis 05/30/2011   Cervical cancer (HCC) 2006   CHF (congestive heart failure) (HCC)    CHF exacerbation (HCC) 07/30/2022   Chronic diastolic heart failure (HCC) 11/14/2010      11/2010 322 lbs      Chronic heart failure with preserved ejection fraction (HCC) 02/23/2018   Chronic lower back pain    Chronic respiratory failure with hypoxia (HCC) 07/02/2018   ABG 11/2017 7.36/69   Consistent with obesity hypoventilation syndrome   Complication of anesthesia    "I have a hard time waking up from under it" (12/05/2017)   COPD (chronic obstructive pulmonary disease) (HCC) 03/04/2015   Coronary artery disease    Cough productive of clear sputum 06/03/2023   Diarrhea 06/18/2019   Diastolic congestive heart  failure (HCC)    Echo 02/21/11: EF 55-60%, moderate LVH, grade 2 diastolic dysfunction, mild to moderate aortic stenosis with a mean gradient 23 mm of mercury   DJD (degenerative joint disease) 07/03/2021   DM2 (diabetes mellitus, type 2) (HCC)  11/10/2010   Dyspnea 12/05/2017   Dyspnea on exertion 02/09/2021   Edema 03/22/2011   Essential (primary) hypertension 11/10/2010   Formatting of this note might be different from the original.  Overview:   Qualifier: Diagnosis of   By: Kem Parkinson      Last Assessment & Plan:   Watch blood pressure off of minoxidil and add medications as needed.   Essential hypertension 11/10/2010   Qualifier: Diagnosis of   By: Kem Parkinson       Gastro-esophageal reflux disease without esophagitis 11/10/2010   GERD 11/10/2010   Qualifier: Diagnosis of   By: Kem Parkinson       Hair loss 09/17/2019   Heart failure (HCC) 04/20/2021   Heart murmur    Hepatitis C    History of cervical cancer 03/22/2011   History of gout X 1   History of tobacco use 04/20/2021   Hx of atrial flutter 03/15/2021   Hyperglycemia due to type 2 diabetes mellitus (HCC) 04/20/2021   Hyperlipidemia    Hypertension    Hypertension associated with diabetes (HCC) 08/25/2018   Irritable bowel syndrome with constipation 04/20/2021   Irritable bowel syndrome with diarrhea 06/18/2019   Limitation of activity due to disability 09/18/2021   Lymphedema of both lower extremities 03/22/2011   Metabolic alkalosis 03/28/2021   Microalbuminuria 04/20/2021   Migraine    "monthly" (12/05/2017)   Mild tricuspid regurgitation 04/02/2021   Mitral valve sclerosis 04/02/2021   Mixed hyperlipidemia 04/18/2020   Moderate aortic stenosis 04/20/2021   Mononeuropathy of lower extremity 04/09/2011   Formatting of this note might be different from the original.  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  Severe, corrected by CPAP 18 with C-flex of 3  2/13 bipap 20/14 sm ff mask h/h biflex+3cm  Formatting of this note might be different from the original.  Sleep study 06/2020: IMPRESSIONS  - Moderate obstructive sleep apnea occurred during this study (AHI = 17.7/h), mostly REM related  - No significant central sleep apnea occurred during   On home oxygen therapy    "2L all the time" (12/05/2017)   On supplemental oxygen by nasal cannula 04/20/2021   Onychomycosis 06/18/2019   OSA treated with BiPAP    "have CPAP at home too; wearing BiPAP right now" (12/05/2017)   Osteoarthritis 11/28/2010   Qualifier: Diagnosis of   By: Huntley Dec, Scott       Other long term (current) drug therapy 02/12/2022   Paroxysmal atrial fibrillation (HCC) 09/04/2021   Physical debility 05/10/2018   Formatting of this note  might be different from the original.  Due to morbid obesity   Physical deconditioning 04/24/2021   Pneumonia    "several times" (12/05/2017)   Pneumonia due to gram-positive bacteria 08/24/2017   Pressure injury of both heels, unstageable (HCC) 07/03/2021   Pulmonary edema, acute (HCC) 02/21/2022   Renal insufficiency 04/20/2021   Restless leg syndrome 03/22/2021   Restrictive lung disease secondary to obesity 01/28/2022   S/P hysterectomy 02/19/2021   Tinea pedis of both feet 11/12/2018   Unspecified atrial flutter (HCC) 02/12/2022   Upper GI bleed 07/18/2022   Venous insufficiency of both lower extremities 07/02/2021   Weakness 03/20/2021   Weight loss 03/15/2021   Past Surgical History:  Procedure Laterality Date   ABDOMINAL SURGERY  2006 X 2   "TAH incision was  infected"   BIOPSY  07/21/2022   Procedure: BIOPSY;  Surgeon: Kathi Der, MD;  Location: [facility];  Service: Gastroenterology;;   CHOLECYSTECTOMY OPEN     ESOPHAGOGASTRODUODENOSCOPY N/A 07/21/2022   Procedure: ESOPHAGOGASTRODUODENOSCOPY (EGD);  Surgeon: Kathi Der, MD;  Location: [facility];  Service: Gastroenterology;  Laterality: N/A;   TONSILLECTOMY     TOTAL ABDOMINAL HYSTERECTOMY  2006   TRACHEOSTOMY REVISION N/A 04/09/2022   Procedure: CONTROL OF BLEEDING,TRACHEOSTOMY SITE;  Surgeon: Christia Reading, MD;  Location[facility];  Service: ENT;  Laterality: N/A;     A IV Location/Drains/Wounds Patient Lines/Drains/Airways Status     Active Line/Drains/Airways     Name Placement date Placement time Site Days   Peripheral IV 08/15/23 20 G Posterior;Right Hand 08/15/23  1143  Hand  less than 1            Intake/Output Last 24 hours No intake or output data in the 24 hours ending 08/15/23 1624  Labs/Imaging Results for orders placed or performed during the hospital encounter of 08/15/23 (from the past 48 hour(s))  Basic metabolic panel     Status: Abnormal   Collection Time: 08/15/23 12:12 PM  Result Value Ref Range   Sodium 141 135 - 145 mmol/L   Potassium 3.2 (L) 3.5 - 5.1 mmol/L   Chloride 82 (L) 98 - 111 mmol/L   CO2 45 (H) 22 - 32 mmol/L   Glucose, Bld 314 (H) 70 - 99 mg/dL    Comment: Glucose reference range applies only to samples taken after fasting for at least 8 hours.   BUN 32 (H) 6 - 20 mg/dL   Creatinine, Ser 7.84 0.44 - 1.00 mg/dL   Calcium 8.6 (L) 8.9 - 10.3 mg/dL   GFR, Estimated >69 >62 mL/min    Comment: (NOTE) Calculated using the CKD-EPI Creatinine Equation (2021)    Anion gap 14 5 - 15    Comment: Performed at [facility], [address], [place], [place] [address]  CBC with Differential     Status: Abnormal   Collection Time: 08/15/23 12:12 PM  Result Value Ref Range   WBC 8.8 4.0 - 10.5 K/uL   RBC 3.35 (L) 3.87 - 5.11  MIL/uL   Hemoglobin 9.4 (L) 12.0 - 15.0 g/dL   HCT 13.2 (L) 44.0 - 10.2 %   MCV 97.0 80.0 - 100.0 fL   MCH 28.1 26.0 - 34.0 pg   MCHC 28.9 (L) 30.0 - 36.0 g/dL   RDW 72.5 36.6 - 44.0 %   Platelets 261 150 - 400 K/uL   nRBC 0.0 0.0 - 0.2 %   Neutrophils Relative % 73 %   Neutro Abs 6.5 1.7 -  7.7 K/uL   Lymphocytes Relative 14 %   Lymphs Abs 1.2 0.7 - 4.0 K/uL   Monocytes Relative 10 %   Monocytes Absolute 0.9 0.1 - 1.0 K/uL   Eosinophils Relative 1 %   Eosinophils Absolute 0.1 0.0 - 0.5 K/uL   Basophils Relative 1 %   Basophils Absolute 0.1 0.0 - 0.1 K/uL   Immature Granulocytes 1 %   Abs Immature Granulocytes 0.06 0.00 - 0.07 K/uL    Comment: Performed at [facility], [address], [place], [place] [address]  Brain natriuretic peptide     Status: Abnormal   Collection Time: 08/15/23 12:13 PM  Result Value Ref Range   B Natriuretic Peptide 243.8 (H) 0.0 - 100.0 pg/mL    Comment: Performed at [facility], [address], [place], [place] [address]  I-Stat arterial blood gas, ED     Status: Abnormal   Collection Time: 08/15/23  2:02 PM  Result Value Ref Range   pH, Arterial 7.349 (L) 7.35 - 7.45   pCO2 arterial 96.6 (HH) 32 - 48 mmHg   pO2, Arterial 54 (L) 83 - 108 mmHg   Bicarbonate 53.6 (H) 20.0 - 28.0 mmol/L   TCO2 >50 (H) 22 - 32 mmol/L   O2 Saturation 84 %   Acid-Base Excess 23.0 (H) 0.0 - 2.0 mmol/L   Sodium 138 135 - 145 mmol/L   Potassium 3.0 (L) 3.5 - 5.1 mmol/L   Calcium, Ion 1.13 (L) 1.15 - 1.40 mmol/L   HCT 31.0 (L) 36.0 - 46.0 %   Hemoglobin 10.5 (L) 12.0 - 15.0 g/dL   Patient temperature 09.8 F    Collection site RADIAL, ALLEN'S TEST ACCEPTABLE    Drawn by RT    Sample type ARTERIAL    Comment NOTIFIED PHYSICIAN    DG Chest Port 1 View  Result Date: 08/15/2023 CLINICAL DATA:  Shortness of breath. EXAM: PORTABLE CHEST 1 VIEW COMPARISON:  06/16/2023. FINDINGS: Low lung volume. There is moderate diffuse pulmonary edema. Redemonstration of  left retrocardiac airspace opacity obscuring the left hemidiaphragm, descending thoracic aorta and blunting the left lateral costophrenic angle suggesting combination of left lower lobe atelectasis and/or consolidation with pleural effusion. Bilateral lung fields are otherwise clear of dense consolidation or lung collapse. There is subtle blunting of right lateral costophrenic angle, which may represent small right pleural effusion. Stable moderately enlarged cardio-mediastinal silhouette. No acute osseous abnormalities. The soft tissues are within normal limits. IMPRESSION: *Findings compatible with congestive heart failure/pulmonary edema. Additional left retrocardiac opacity and bilateral pleural effusions, as described above. Electronically Signed   By: Jules Schick M.D.   On: 08/15/2023 14:51    Pending Labs Unresulted Labs (From admission, onward)     Start     Ordered   08/16/23 0500  Basic metabolic panel  Tomorrow morning,   R        08/15/23 1559   08/16/23 0500  CBC  Tomorrow morning,   R        08/15/23 1559   08/16/23 0500  Magnesium  Tomorrow morning,   R        08/15/23 1559   08/15/23 1830  Blood gas, venous  Once,   R        08/15/23 1559   08/15/23 1830  TSH  Once,   R        08/15/23 1619   08/15/23 1400  Blood gas, arterial  Once,   R  08/15/23 1159   08/15/23 1122  Blood gas, arterial (at [facility])  Once,   R        08/15/23 1121            Vitals/Pain Today's Vitals   08/15/23 1300 08/15/23 1400 08/15/23 1406 08/15/23 1407  BP: 124/79 122/78    Pulse: 83 78 79 83  Resp: 19 (!) 24 (!) 27 20  Temp:      TempSrc:      SpO2: 91% 96% 100% 100%  PainSc:        Isolation Precautions No active isolations  Medications Medications  atorvastatin (LIPITOR) tablet 20 mg (has no administration in time range)  buPROPion ER (WELLBUTRIN SR) 12 hr tablet 100 mg (has no administration in time range)  empagliflozin (JARDIANCE) tablet 10 mg (10 mg Oral Given  08/15/23 1618)  levothyroxine (SYNTHROID) tablet 150 mcg (has no administration in time range)  apixaban (ELIQUIS) tablet 5 mg (has no administration in time range)  gabapentin (NEURONTIN) capsule 400 mg (400 mg Oral Given 08/15/23 1617)  rOPINIRole (REQUIP) tablet 0.5 mg (has no administration in time range)  albuterol (PROVENTIL) (2.5 MG/3ML) 0.083% nebulizer solution 2.5 mg (has no administration in time range)  budesonide (PULMICORT) nebulizer solution 0.5 mg (has no administration in time range)  montelukast (SINGULAIR) tablet 10 mg (has no administration in time range)  acetaminophen (TYLENOL) tablet 650 mg (has no administration in time range)    Or  acetaminophen (TYLENOL) suppository 650 mg (has no administration in time range)  polyethylene glycol (MIRALAX / GLYCOLAX) packet 17 g (has no administration in time range)  insulin aspart (novoLOG) injection 0-15 Units (has no administration in time range)  pantoprazole (PROTONIX) EC tablet 40 mg (has no administration in time range)  spironolactone (ALDACTONE) tablet 25 mg (25 mg Oral Given 08/15/23 1618)  furosemide (LASIX) injection 40 mg (40 mg Intravenous Given 08/15/23 1351)  potassium chloride SA (KLOR-CON M) CR tablet 40 mEq (40 mEq Oral Given 08/15/23 1351)  furosemide (LASIX) injection 40 mg (40 mg Intravenous Given 08/15/23 1618)    Mobility non-ambulatory     Focused Assessments Pulmonary Assessment Handoff:  Lung sounds: Bilateral Breath Sounds: Diminished O2 Device: Bi-PAP O2 Flow Rate (L/min): 15 L/min    R Recommendations: See Admitting Provider Note  Report given to:   Additional Notes:

## 2023-08-15 NOTE — Progress Notes (Signed)
Providers were at the bedside to speak w/ pt  Pt agreed to VBG  VBG being collected at this time

## 2023-08-15 NOTE — Progress Notes (Signed)
Per report verbal OK one time only PO admin, pt refused labs & IV K  On call family med team notified via page

## 2023-08-15 NOTE — Hospital Course (Addendum)
MARGHERITA HOSP is a 57 y.o. year old with a history of HFpEF, COPD, T2DM, a flutter, and hypothyroidism who was admitted to the [facility] Medicine Teaching service for acute on chronic hypoxic and hypercarbic respiratory failure.  Acute on chronic hypoxic and hypercarbic respiratory failure Patient sent by SNF for hypoxia to the 70s and increased sleepiness. In the ED, patient with tachypnea, hypercapnia (96.6) and mild acidosis (pH 7.349). Initial workup with elevated BNP (243.8) and CXR with pulmonary edema and bilateral effusions. Patient placed on bipap with symptomatic improvement. 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 By time of discharge, patient was satting well on 3L [place] during the day with nightly bipap usage.   HFpEF exacerbation BNP elevated to 243 on arrival. CXR with pulmonary edema and bilateral pleural effusions.Recent echo (Aug 2024) with normal LVEF and diastolic function.  During admission, patient received IV diuretics and transitioned to PO diuretics by time of discharge. Patient down 10 kg with net 11 L UOP from day of admission. Diuretics held briefly towards end of admission for AKI per below. Continued home torsemide 60 mg twice daily at discharge.  Type 2 diabetes mellitus A1c 9.3 during admission.  Continued home Jardiance 25 mg daily and given insulin glargine with SSI throughout admission. BG ranging in the 200-300s throughout admission. Discharged on 30 u long acting daily.   AKI Normal baseline creatinine. During admission, Cr rose to max 1.43. Held diuresis afterwards with Cr improvement to 1.14. Overall, AKI likely prerenal from diuresis.  Yeast infection Patient described some vaginal discharge, burning, and itching.  Has history of yeast infections.  Received Diflucan 150 mg once during admission with symptomatic improvement.  Other chronic conditions were medically managed with home medications  and formulary alternatives as necessary (hypothyroidism, COPD, HLD, restless leg syndrome, GERD, chronic pain, GAD).  SNF/PCP Follow-up Recommendations: CT chest in next 2 weeks for resolution of previously identified nodular infiltrates  Sleep medicine follow-up to recalibrate BiPAP settings Held metolazone during admission. Reassess need.  Consider neurology follow up for twitching  Consider starting GLP-1

## 2023-08-15 NOTE — H&P (Addendum)
Hospital Admission History and Physical Service Pager: [phone]  Patient name: Chelsea [name] Medical record number: [id number] Date of Birth: 17-Jan-1966 Age: 57 y.o. Gender: female  Primary Care Provider: Pcp, No Consultants: None Code Status: Full which was confirmed by patient  Preferred Emergency Contact: [name] (husband): [phone]  Chief Complaint: Difficulty breathing  Assessment and Plan: Chelsea [name] is a 57 y.o. female with history of HFpEF (60%), COPD, T2DM, A flutter, and hypothyroidism presenting with acute hypoxic respiratory failure. Differential for presentation of this includes:  OHS/OSA: Most likely a component given hypercapnia and acidosis iso days without Bipap usage. Hypercapnic to 96.6.  HF exacerbation: Most likely a component given fluid overload on exam, edema/effusion on CXR, and elevated BNP (243.8). COPD exacerbation: Likely a component given increased coughing and sputum change.  Infection: Afebrile without leukocytosis.  DKA: Hyperglycemic to 300s on arrival. No recent insulin use reported. Mild acidosis of pH 7.349 with small AG of 14.  PE: Less likely. Wells score 1.5. No tachycardia or chest pain. ACS: Not likely given EKG without ischemic changes and no chest pain.  Assessment & Plan Acute hypoxic respiratory failure (HCC) Patient with reported hypoxia to the 70s at SNF, found to be tachypneic, mildly acidotic (7.349), hypercapnic (96.6) in ED. Patient has not used her Bipap for past few nights. Initial workup included elevated BNP (243.8) with fluid overload noted on CXR and exam. Most likely mixed etiology including OHS/OSA and HF exacerbation. Possible component of COPD exacerbation as well given cough and sputum change. Low suspicion for infection, PE, ACS at this time per ddx.  - Admit to Progressive with FMTS attending Dr Lum Babe - Continue Bipap. VBG ordered to reassess pH and CO2 following further bipap use. - Once  stable, restart home 8L O2 [place] during day with Bipap nightly  - Cont home albuterol neb q6 prn, pulmicort neb q12, montelukast 10 nightly  - Continuous cardiac telemetry  - Continuous pulse ox - Neuro checks q4 - Vital signs per floor protocol - Monitor fever curve. Consider abx as needed for possible infection.  - Consider CTPE for worsening symptoms - AM CBC, BMP, Mg - replete electrolytes as needed for K >4, Mg > 2  Acute exacerbation of chronic heart failure (HCC) Patient with chronic HF found to be fluid overloaded on exam and initial workup. BNP elevated to 243.8. CXR with pulmonary edema and bilateral pleural effusions. S/p 40 IV lasix in ED. Home diuretics include torsemide 40 BID, spironolactone 25 daily, metolazone 2.5 every other day.  - Additional 40 IV lasix ordered - Continue home spironolactone 25 daily - Holding home torsemide, metolazone - restart as needed/tolerated  - Strict I/Os - Daily weights  - AM BMP, Mg - replete electrolytes as needed for K >4, Mg > 2 - Consider HF consults, given previous admissions Type 2 diabetes mellitus with hyperglycemia, with long-term current use of insulin (HCC) Poorly controlled. Hyperglycemic to 300s on arrival. Last A1c of 8.5 in Aug 2024. Home regimen includes Jardiance daily. Previously prescribed insulin, but patient has not been using.  - continue home Jardiance 10 daily  - mSSI + CBGs AC & at bedtime - AM A1c  Anemia, unspecified type Chronic anemia with iron deficiency, with home iron supplementation. Hgb 9.2 on arrival. Unclear baseline, possibly around 10-11. Iron 13 with ferritin 29 in Aug 2024. Patient's anemia may be contributing somewhat to SOB.  - Cont home ferrous sulfate 325 daily once off of bipap  -  AM CBC  Chronic and Stable Problems:  Hypothyroidism: Cont home levothyroxine 150 daily  COPD: Cont home albuterol neb q6 prn, pulmicort neb q12, montelukast 10 nightly  HLD: Cont home atorvastatin 20 nightly  RLS:  Cont home Requip 0.5 every other day  GERD: Protonix 40 (formulary equivalent for home Prilosec 20) Chronic pain: holding home percocet iso acute respiratory failure on bipap  Mood: holding home Xanax prn iso acute respiratory failure on bipap  FEN/GI: NPO while on Bipap  VTE Prophylaxis: Home Eliquis 5 BID  Disposition: Progressive with telemetry   History of Present Illness:  Chelsea [name] is a 57 y.o. female with history of HFpEF (60%), COPD, T2DM, A flutter, and hypothyroidism presenting with acute hypoxic respiratory failure.  Per patient, she was brought from her SNF [facility] because she was unable to stay awake and having difficulty breathing. She has not been wearing her bipap at night for a few days. Has been feeling more short of breath and endorses worsening cough productive of yellow phlegm. No known fevers. No chest or belly pain. No N/V/D. Eating and drinking normally.  Per ED, patient was sent by SNF for hypoxia to the 70s. In the ED, patient with tachypnea, hypercapnia (96.6) and mild acidosis (pH 7.349). Initial workup with elevated BNP (243.8) and CXR with pulmonary edema and bilateral effusions. Bipap was started and patient stablized for admission to Progressive care.   Review Of Systems: Per HPI   Pertinent Past Medical History: HFpEF (60-65% in Aug 2024)  COPD  Chronic hypoxic respiratory failure  Atrial Flutter Diabetes  Hypothyroidism  Cervical Cancer s/p hysterectomy Restless leg syndrome IDA HLD Remainder reviewed in history tab.   Pertinent Past Surgical History: Tracheostomy (2023) Cholecystectomy (?) Hysterectomy (2006) Remainder reviewed in history tab.   Pertinent Social History: Tobacco use: No Alcohol use: Not currently Other Substance use: None Lives at [facility], [employer]  Pertinent Family History: Father - heart disease Remainder reviewed in history tab.   Important Outpatient Medications: Amiodarone 200 mg daily - not  sure if taking Albuterol inhaler and neb prn - using neb twice a day Alprazolam 0.25 q8 prn - yes Atorvastatin 20 mg daily - yes Budesonide nebulizer 0.5 mg q12h Bupropion ER 100 mg BID - not sure Cholecalciferol 125 mcg (5,000 units) daily Eliquis 5 mg BID - yes Empaglifozin 10 mg daily - yes Ferrous sulfate 325 mg daily - yes Gabapentin 400 TID - yes Hydorxyzine 25 mg in am and at bedtime - unsure Insulin aspart with meals (0-20 u) - no Levothyroxine 150 mcg daily - yes Loperamide 2 q4 prn -no Metolazone 2.5 every other day - yes Montelukast 10 mg daily - yes Percocet 10-325 q6h - yes Potassium 40 mEq daily - yes Ropinirole 0.5 at bedtime - yes Spironolactone 25 mg daily - yes Torsemide 60 mg BID - yes Oxygen 8 L/min - during day Bipap at night  Remainder reviewed in medication history.   Objective: BP 124/79   Pulse 83   Temp 97.7 F (36.5 C) (Oral)   Resp 20   SpO2 100%  Exam: General: No acute distress. Sleepy, easily awoken.  Eyes: Sclera without injection or icterus bilaterally. Cardiovascular: Harsh systolic murmur. No appreciable rubs, gallops, or heaves. Warm and well-perfused overall.  Respiratory: On bipap. Diminished breath sounds throughout.  Gastrointestinal: Soft, nontender, nondistended.  MSK: Equal bilateral LE edema consistent with chronic venous stasis. Nontender to palpation. Derm: LE skin changes consistent with venous stasis.  Neuro: AO  x4. PERRL. EOMI. Normal facial nerve sensation. Normal strength and sensation of UE. LE strength assessment limited by pain, intact and equal sensation.  Psych: Pleasant and approriate.  Labs:  CBC    Component Value Date/Time   WBC 8.8 08/15/2023 1212   RBC 3.35 (L) 08/15/2023 1212   HGB 10.5 (L) 08/15/2023 1402   HCT 31.0 (L) 08/15/2023 1402   PLT 261 08/15/2023 1212   MCV 97.0 08/15/2023 1212   MCH 28.1 08/15/2023 1212   MCHC 28.9 (L) 08/15/2023 1212   RDW 13.9 08/15/2023 1212   LYMPHSABS 1.2  08/15/2023 1212   MONOABS 0.9 08/15/2023 1212   EOSABS 0.1 08/15/2023 1212   BASOSABS 0.1 08/15/2023 1212         08/15/2023   12:12 PM   BMP  Glucose   314    BUN   32    Creatinine   0.86    Sodium   141    Potassium   3.2    Chloride   82    CO2   45    Calcium   8.6    Anion Gap 14  ABG    Component Value Date/Time   PHART 7.349 (L) 08/15/2023 1402   PCO2ART 96.6 (HH) 08/15/2023 1402   PO2ART 54 (L) 08/15/2023 1402   HCO3 53.6 (H) 08/15/2023 1402   TCO2 >50 (H) 08/15/2023 1402   O2SAT 84 08/15/2023 1402   BNP: 243.8  EKG: PR int 233. No ischemic changes  Imaging Studies Performed: CXR: pulmonary edema and bilateral pleural effusions  [name], MD 08/15/2023, 2:09 PM PGY-1, [facility]  FPTS Intern pager: [phone], text pages welcome Secure chat group [facility] Teaching Service   I have verified that the resident's  findings are accurately documented in the resident's note. I have made edits and changes where appropriate, and agree with plan.  Celine Mans, MD, PGY-2 [facility] Family Medicine 7:41 PM 08/15/2023

## 2023-08-15 NOTE — Assessment & Plan Note (Addendum)
Patient with reported hypoxia to the 70s at SNF, found to be tachypneic, mildly acidotic (7.349), hypercapnic (96.6) in ED. Patient has not used her Bipap for past few nights. Initial workup included elevated BNP (243.8) with fluid overload noted on CXR and exam. Most likely mixed etiology including OHS/OSA and HF exacerbation. Possible component of COPD exacerbation as well given cough and sputum change. Low suspicion for infection, PE, ACS at this time per ddx.  - Admit to Progressive with FMTS attending Dr Lum Babe - Continue Bipap. VBG ordered to reassess pH and CO2 following further bipap use. - Once stable, restart home 8L O2 [place] during day with Bipap nightly  - Cont home albuterol neb q6 prn, pulmicort neb q12, montelukast 10 nightly  - Continuous cardiac telemetry  - Continuous pulse ox - Neuro checks q4 - Vital signs per floor protocol - Monitor fever curve. Consider abx as needed for possible infection.  - Consider CTPE for worsening symptoms - AM CBC, BMP, Mg - replete electrolytes as needed for K >4, Mg > 2

## 2023-08-15 NOTE — Plan of Care (Signed)
FMTS Brief Progress Note  S: Saw patient with Dr. Rexene Alberts during night rounds.  Patient is admitted today for acute on chronic hypoxic hypercapnic respiratory failure.  On arrival to bedside patient was laying down in the bed on BiPAP.  Asleep sleepy but woke up to respond to questions and is generally fatigued.  Denies being in any acute distress at this time.     O: BP 110/62 (BP Location: Right Arm)   Pulse 71   Temp 98.6 F (37 C) (Axillary)   Resp 15   Wt (!) 158.4 kg   SpO2 95%   BMI 58.11 kg/m    General: Alert, morbidly obese, NAD CV: RRR, no murmurs, normal S1/S2 Pulm: Currently on BiPAP Abd: Soft, no distension, no tenderness Ext: No BLE edema  A/P: Acute on chronic hypoxic/hypercarbic respiratory failure Patient's initial workup with x-ray showing pulmonary edema and blood gas showing elevated pCO2 of 96.6 is more consistent with mixed picture of HF exacerbation and hypoventilation in the setting of morbid obesity/OSA. -Will obtain evening VBG -Continue BiPAP -Oxygen supplementation as needed - Orders reviewed. Labs for AM ordered, which was adjusted as needed.  -Continue plans per day team -Ordered bariatric bed  Jerre Simon, MD 08/15/2023, 9:30 PM PGY-3, [facility] Night Resident  Please page [phone] with questions.

## 2023-08-15 NOTE — Progress Notes (Signed)
Pt transported from ED to 3E20 on BiPAP w/o any complications.

## 2023-08-16 DIAGNOSIS — I509 Heart failure, unspecified: Secondary | ICD-10-CM | POA: Diagnosis not present

## 2023-08-16 DIAGNOSIS — E1140 Type 2 diabetes mellitus with diabetic neuropathy, unspecified: Secondary | ICD-10-CM

## 2023-08-16 DIAGNOSIS — Z794 Long term (current) use of insulin: Secondary | ICD-10-CM | POA: Diagnosis not present

## 2023-08-16 DIAGNOSIS — E1165 Type 2 diabetes mellitus with hyperglycemia: Secondary | ICD-10-CM

## 2023-08-16 DIAGNOSIS — E119 Type 2 diabetes mellitus without complications: Secondary | ICD-10-CM

## 2023-08-16 DIAGNOSIS — D649 Anemia, unspecified: Secondary | ICD-10-CM | POA: Insufficient documentation

## 2023-08-16 DIAGNOSIS — J9601 Acute respiratory failure with hypoxia: Secondary | ICD-10-CM | POA: Diagnosis not present

## 2023-08-16 LAB — BASIC METABOLIC PANEL
BUN: 29 mg/dL — ABNORMAL HIGH (ref 6–20)
CO2: >45 mmol/L — ABNORMAL HIGH (ref 22–32)
Calcium: 9.1 mg/dL (ref 8.9–10.3)
Chloride: 83 mmol/L — ABNORMAL LOW (ref 98–111)
Creatinine, Ser: 0.68 mg/dL (ref 0.44–1.00)
GFR, Estimated: >60 mL/min (ref >60)
Glucose, Bld: 208 mg/dL — ABNORMAL HIGH (ref 70–99)
Potassium: 4.1 mmol/L (ref 3.5–5.1)
Sodium: 143 mmol/L (ref 135–145)

## 2023-08-16 LAB — CBC
HCT: 32.4 % — ABNORMAL LOW (ref 36.0–46.0)
Hemoglobin: 9.6 g/dL — ABNORMAL LOW (ref 12.0–15.0)
MCH: 28.5 pg (ref 26.0–34.0)
MCHC: 29.6 g/dL — ABNORMAL LOW (ref 30.0–36.0)
MCV: 96.1 fL (ref 80.0–100.0)
Platelets: 260 10*3/uL (ref 150–400)
RBC: 3.37 MIL/uL — ABNORMAL LOW (ref 3.87–5.11)
RDW: 13.7 % (ref 11.5–15.5)
WBC: 7 10*3/uL (ref 4.0–10.5)
nRBC: 0 % (ref 0.0–0.2)

## 2023-08-16 LAB — BLOOD GAS, VENOUS
Acid-Base Excess: 34.9 mmol/L — ABNORMAL HIGH (ref 0.0–2.0)
Acid-Base Excess: 37.1 mmol/L — ABNORMAL HIGH (ref 0.0–2.0)
Bicarbonate: 63.6 mmol/L — ABNORMAL HIGH (ref 20.0–28.0)
Bicarbonate: 67.7 mmol/L — ABNORMAL HIGH (ref 20.0–28.0)
Drawn by: 59174
O2 Saturation: 97.9 %
O2 Saturation: 38.4 %
Patient temperature: 36.8
Patient temperature: 37.1
pCO2, Ven: 70 mm[Hg] — ABNORMAL HIGH (ref 44–60)
pCO2, Ven: 93 mm[Hg] (ref 44–60)
pH, Ven: 7.56 — ABNORMAL HIGH (ref 7.25–7.43)
pH, Ven: 7.47 — ABNORMAL HIGH (ref 7.25–7.43)
pO2, Ven: 78 mm[Hg] — ABNORMAL HIGH (ref 32–45)
pO2, Ven: <31 mm[Hg] — CL (ref 32–45)

## 2023-08-16 LAB — HEMOGLOBIN A1C
Hgb A1c MFr Bld: 9.3 % — ABNORMAL HIGH (ref 4.8–5.6)
Mean Plasma Glucose: 220.21 mg/dL

## 2023-08-16 LAB — GLUCOSE, CAPILLARY
Glucose-Capillary: 182 mg/dL — ABNORMAL HIGH (ref 70–99)
Glucose-Capillary: 180 mg/dL — ABNORMAL HIGH (ref 70–99)
Glucose-Capillary: 223 mg/dL — ABNORMAL HIGH (ref 70–99)
Glucose-Capillary: 288 mg/dL — ABNORMAL HIGH (ref 70–99)

## 2023-08-16 LAB — MAGNESIUM: Magnesium: 2.5 mg/dL — ABNORMAL HIGH (ref 1.7–2.4)

## 2023-08-16 MED ORDER — ACETAMINOPHEN 325 MG PO TABS
650.0000 mg | ORAL_TABLET | Freq: Four times a day (QID) | ORAL | Status: DC
  Administered 2023-08-16 – 2023-08-19 (×10): 650 mg via ORAL
  Filled 2023-08-16 (×11): qty 2

## 2023-08-16 MED ORDER — EMPAGLIFLOZIN 25 MG PO TABS
25.0000 mg | ORAL_TABLET | Freq: Every day | ORAL | Status: DC
  Administered 2023-08-17 – 2023-08-21 (×5): 25 mg via ORAL
  Filled 2023-08-16 (×5): qty 1

## 2023-08-16 MED ORDER — FUROSEMIDE 10 MG/ML IJ SOLN
80.0000 mg | Freq: Two times a day (BID) | INTRAMUSCULAR | Status: DC
  Administered 2023-08-16 – 2023-08-18 (×5): 80 mg via INTRAVENOUS
  Filled 2023-08-16 (×5): qty 8

## 2023-08-16 MED ORDER — SENNA 8.6 MG PO TABS
1.0000 | ORAL_TABLET | Freq: Two times a day (BID) | ORAL | Status: DC
  Filled 2023-08-16: qty 1

## 2023-08-16 MED ORDER — OXYCODONE HCL 5 MG PO TABS
5.0000 mg | ORAL_TABLET | Freq: Four times a day (QID) | ORAL | Status: DC | PRN
  Administered 2023-08-16 – 2023-08-17 (×3): 5 mg via ORAL
  Filled 2023-08-16 (×3): qty 1

## 2023-08-16 MED ORDER — FERROUS SULFATE 325 (65 FE) MG PO TABS
325.0000 mg | ORAL_TABLET | Freq: Every day | ORAL | Status: DC
  Administered 2023-08-16 – 2023-08-21 (×6): 325 mg via ORAL
  Filled 2023-08-16 (×7): qty 1

## 2023-08-16 MED ORDER — HYDROXYZINE HCL 25 MG PO TABS
25.0000 mg | ORAL_TABLET | Freq: Once | ORAL | Status: AC
  Administered 2023-08-16: 25 mg via ORAL
  Filled 2023-08-16: qty 1

## 2023-08-16 MED ORDER — ACETAMINOPHEN 650 MG RE SUPP: 650.0000 mg | Freq: Four times a day (QID) | RECTAL | Status: DC

## 2023-08-16 NOTE — Assessment & Plan Note (Addendum)
Patient with reported hypoxia to the 70s at SNF, found to be tachypneic, mildly acidotic (7.349), hypercapnic (96.6) in ED. Patient had not used her Bipap for several nights prior to admission. Initial workup included elevated BNP (243.8) with fluid overload noted on CXR and exam. Most likely mixed etiology including OHS/OSA and HF exacerbation. Possible component of COPD exacerbation as well given cough and sputum change. Low suspicion for infection, PE, ACS. Am VBG with improving CO2 70, pH 7.56. - Continue Bipap.  - PM VBG, goal CO2 <50 given patient's chronic elevation - Once stable, restart home 8L O2 [place] during day with Bipap nightly  - Cont home albuterol neb q6 prn, pulmicort neb q12, montelukast 10 nightly  - Continuous cardiac telemetry  - Continuous pulse ox - Neuro checks q4 - Vital signs per floor protocol - Monitor fever curve. Consider abx as needed for possible infection.  - Consider CTPE for worsening symptoms - AM CBC, BMP, Mg - replete electrolytes as needed for K >4, Mg > 2

## 2023-08-16 NOTE — Progress Notes (Signed)
Daily Progress Note Intern Pager: [phone]  Patient name: Chelsea [name] Medical record number: [id number] Date of birth: 03/02/1966 Age: 57 y.o. Gender: female  Primary Care Provider: Pcp, No Consultants: None Code Status: Full  Pt Overview and Major Events to Date:  11/14: Admitted to progressive with FMTS  Assessment and Plan:  Chelsea [name] is a 57 y.o. female with history of HFpEF (60%), COPD, T2DM, A flutter, and hypothyroidism presenting with acute hypoxic respiratory failure likely secondary to OHS/OSA and heart failure exacerbation.  Assessment & Plan Acute hypoxic respiratory failure (HCC) Patient with reported hypoxia to the 70s at SNF, found to be tachypneic, mildly acidotic (7.349), hypercapnic (96.6) in ED. Patient had not used her Bipap for several nights prior to admission. Initial workup included elevated BNP (243.8) with fluid overload noted on CXR and exam. Most likely mixed etiology including OHS/OSA and HF exacerbation. Possible component of COPD exacerbation as well given cough and sputum change. Low suspicion for infection, PE, ACS. Am VBG with improving CO2 70, pH 7.56. - Continue Bipap.  - PM VBG, goal CO2 <50 given patient's chronic elevation - Once stable, restart home 8L O2 [place] during day with Bipap nightly  - Cont home albuterol neb q6 prn, pulmicort neb q12, montelukast 10 nightly  - Continuous cardiac telemetry  - Continuous pulse ox - Neuro checks q4 - Vital signs per floor protocol - Monitor fever curve. Consider abx as needed for possible infection.  - Consider CTPE for worsening symptoms - AM CBC, BMP, Mg - replete electrolytes as needed for K >4, Mg > 2  Acute exacerbation of chronic heart failure (HCC) Patient with chronic HF found to be fluid overloaded on exam and initial workup. BNP elevated to 243.8. CXR with pulmonary edema and bilateral pleural effusions. S/p 40 IV lasix in ED. Home diuretics include torsemide 40 BID,  spironolactone 25 daily, metolazone 2.5 every other day. S/p 80mg  IV lasix with 2.6 UOP in last 24 hr.  - Plan for lasix 80 BID today  - Continue home spironolactone 25 daily - Holding home torsemide, metolazone - restart as needed/tolerated  - Strict I/Os - Daily weights  - AM BMP, Mg - replete electrolytes as needed for K >4, Mg > 2 Type 2 diabetes mellitus (HCC) Poorly controlled. Hyperglycemic to 300s on arrival. A1c of 9.3. Home regimen includes Jardiance daily. Previously prescribed insulin, but patient has not been using.  - continue home Jardiance 10 daily  - mSSI + CBGs AC & at bedtime - consider insulin regimen at discharge for better BG control  Anemia Chronic anemia with iron deficiency, with home iron supplementation. Hgb 9.2 > 9.6 stable. Unclear baseline, possibly around 10-11. Iron 13 with ferritin 29 in Aug 2024. Patient's anemia may be contributing somewhat to SOB.  - Cont home ferrous sulfate 325 daily once off of bipap  - AM CBC  Chronic and Stable Issues: Hypothyroidism: Cont home levothyroxine 150 daily  COPD: Cont home albuterol neb q6 prn, pulmicort neb q12, montelukast 10 nightly  HLD: Cont home atorvastatin 20 nightly  RLS: Cont home Requip 0.5 every other day  GERD: Protonix 40 (formulary equivalent for home Prilosec 20) Chronic pain: holding home percocet iso acute respiratory failure on bipap  Mood: holding home Xanax prn iso acute respiratory failure on bipap  FEN/GI: NPO while on Bipap  VTE Prophylaxis: Home Eliquis 5 BID Dispo: Return to SNF pending clinical improvement   Subjective:  Patient feeling better this  morning. Breathing feels better, no more coughing. No chest pain. No abdominal pain. Feeling less sleepy than before.   Objective: Temp:  [97.7 F (36.5 C)-98.6 F (37 C)] 98.2 F (36.8 C) (11/15 0510) Pulse Rate:  [69-83] 74 (11/15 0542) Resp:  [15-27] 21 (11/15 0510) BP: (100-124)/(59-79) 110/61 (11/15 0542) SpO2:  [90 %-100 %] 95 %  (11/15 0542) FiO2 (%):  [50 %] 50 % (11/15 0315) Weight:  [158.4 kg] 158.4 kg (11/14 2000) Physical Exam: General: No acute distress. Sleeping, easily awoken.  Cardiovascular: Harsh systolic murmur. No appreciable rubs, gallops, or heaves. Warm and well-perfused overall.  Respiratory: On bipap. Diminished breath sounds throughout, though difficult to assess due to body habitus. No increased WOB.  Gastrointestinal: Soft, nontender, nondistended.  MSK: Equal bilateral LE edema consistent with chronic venous stasis. Nontender to palpation. Derm: LE skin changes consistent with venous stasis.  Neuro: AO x4. PERRL.   Laboratory: Most recent CBC Lab Results  Component Value Date   WBC 7.0 08/16/2023   HGB 9.6 (L) 08/16/2023   HCT 32.4 (L) 08/16/2023   MCV 96.1 08/16/2023   PLT 260 08/16/2023   Most recent BMP    Latest Ref Rng & Units 08/16/2023    4:19 AM  BMP  Glucose 70 - 99 mg/dL 062   BUN 6 - 20 mg/dL 29   Creatinine 6.94 - 1.00 mg/dL 8.54   Sodium 627 - 035 mmol/L 143   Potassium 3.5 - 5.1 mmol/L 4.1   Chloride 98 - 111 mmol/L 83   CO2 22 - 32 mmol/L >45   Calcium 8.9 - 10.3 mg/dL 9.1    VBG: pH 0.09, CO2 70  [name], MD 08/16/2023, 7:22 AM  PGY-1, [facility] FPTS Intern pager: [phone], text pages welcome Secure chat group [facility] Teaching Service

## 2023-08-16 NOTE — Assessment & Plan Note (Signed)
Chronic anemia with iron deficiency, with home iron supplementation. Hgb 9.2 on arrival. Unclear baseline, possibly around 10-11. Iron 13 with ferritin 29 in Aug 2024. Patient's anemia may be contributing somewhat to SOB.  - Cont home ferrous sulfate 325 daily once off of bipap  - AM CBC

## 2023-08-16 NOTE — Assessment & Plan Note (Addendum)
Poorly controlled. Hyperglycemic to 300s on arrival. A1c of 9.3. Home regimen includes Jardiance daily. Previously prescribed insulin, but patient has not been using.  - continue home Jardiance 10 daily  - mSSI + CBGs AC & at bedtime - consider insulin regimen at discharge for better BG control

## 2023-08-16 NOTE — Assessment & Plan Note (Addendum)
Chronic anemia with iron deficiency, with home iron supplementation. Hgb 9.2 > 9.6 stable. Unclear baseline, possibly around 10-11. Iron 13 with ferritin 29 in Aug 2024. Patient's anemia may be contributing somewhat to SOB.  - Cont home ferrous sulfate 325 daily once off of bipap  - AM CBC

## 2023-08-16 NOTE — Plan of Care (Signed)
  Problem: Education: Goal: Knowledge of General Education information will improve Description: Including pain rating scale, medication(s)/side effects and non-pharmacologic comfort measures Outcome: Progressing   Problem: Health Behavior/Discharge Planning: Goal: Ability to manage health-related needs will improve Outcome: Progressing   Problem: Clinical Measurements: Goal: Ability to maintain clinical measurements within normal limits will improve Outcome: Progressing Goal: Respiratory complications will improve Outcome: Progressing   Problem: Activity: Goal: Risk for activity intolerance will decrease Outcome: Progressing   Problem: Coping: Goal: Level of anxiety will decrease Outcome: Progressing   

## 2023-08-16 NOTE — Progress Notes (Signed)
Bariatric bed was ordered & offered; pt declined at this time due to discomfort during movement

## 2023-08-16 NOTE — Progress Notes (Signed)
Spoke to phlebotomy on phone re: obtaining VBG this AM

## 2023-08-16 NOTE — Progress Notes (Signed)
   08/16/23 0432  Provider Notification  Provider Name/Title Everhart DO/Norbert MD  Date Provider Notified 08/16/23  Time Provider Notified [phone]  Method of Notification Page  Notification Reason Requested by patient/family (Anti anxiety)   OK per MD one time only to take PO - atarax

## 2023-08-16 NOTE — Inpatient Diabetes Management (Signed)
Inpatient Diabetes Program Recommendations  AACE/ADA: New Consensus Statement on Inpatient Glycemic Control (2015)  Target Ranges:  Prepandial:   less than 140 mg/dL      Peak postprandial:   less than 180 mg/dL (1-2 hours)      Critically ill patients:  140 - 180 mg/dL   Lab Results  Component Value Date   GLUCAP 180 (H) 08/16/2023   HGBA1C 9.3 (H) 08/16/2023    Review of Glycemic Control  Latest Reference Range & Units 08/15/23 20:51 08/16/23 06:47 08/16/23 12:04  Glucose-Capillary 70 - 99 mg/dL 413 (H) 244 (H) 010 (H)  (H): Data is abnormally high Diabetes history: Type 2 DM Outpatient Diabetes medications: Jardiance 25 mg every day, Novolog 0-20 units TID (NT) Current orders for Inpatient glycemic control: Jardiance 25 mg every day, Novolog 0-15 units TID  Inpatient Diabetes Program Recommendations:    Consider adding Semglee 12 units every day and changing diet to carb mod if appropriate.  Thanks, Lujean Rave, MSN, RNC-OB Diabetes Coordinator [phone] (8a-5p)

## 2023-08-16 NOTE — Assessment & Plan Note (Addendum)
Patient with chronic HF found to be fluid overloaded on exam and initial workup. BNP elevated to 243.8. CXR with pulmonary edema and bilateral pleural effusions. S/p 40 IV lasix in ED. Home diuretics include torsemide 40 BID, spironolactone 25 daily, metolazone 2.5 every other day. S/p 80mg  IV lasix with 2.6 UOP in last 24 hr.  - Plan for lasix 80 BID today  - Continue home spironolactone 25 daily - Holding home torsemide, metolazone - restart as needed/tolerated  - Strict I/Os - Daily weights  - AM BMP, Mg - replete electrolytes as needed for K >4, Mg > 2

## 2023-08-16 NOTE — Progress Notes (Signed)
RT attempted to obtain blood gas, after one stick patient asked to stop and requested not to be stuck for arterial again.

## 2023-08-16 NOTE — TOC Progression Note (Signed)
Transition of Care [facility]) - Progression Note    Patient Details  Name: Chelsea [name] MRN: [id number] Date of Birth: 1966/01/11  [facility]) CM/SW Contact  Erin Sons, [place] Phone Number: 08/16/2023, 2:08 PM  Clinical Narrative:     CSW confirmed with [facility] that pt is a long term care resident there. TOC will follow to assist with DC needs.          Social Determinants of Health (SDOH) Interventions SDOH Screenings   Food Insecurity: No Food Insecurity (08/15/2023)  Housing: Low Risk  (08/15/2023)  Recent Concern: Housing - Medium Risk (06/06/2023)  Transportation Needs: No Transportation Needs (08/15/2023)  Utilities: Not At Risk (08/15/2023)  Recent Concern: Utilities - At Risk (06/06/2023)  Alcohol Screen: Low Risk  (07/05/2022)  Financial Resource Strain: Low Risk  (07/05/2022)  Physical Activity: Insufficiently Active (03/27/2021)   Received from [facility], [facility]  Social Connections: Unknown (02/04/2022)   Received from [facility], [facility]  Stress: No Stress Concern Present (09/15/2021)   Received from [facility], [facility]  Tobacco Use: Low Risk  (08/15/2023)    Readmission Risk Interventions    07/23/2022    2:19 PM 04/09/2022    4:17 PM  Readmission Risk Prevention Plan  Transportation Screening Complete Complete  PCP or Specialist Appt within 3-5 Days  Complete  HRI or Home Care Consult  Complete  Social Work Consult for Recovery Care Planning/Counseling  Complete  Palliative Care Screening  Not Applicable  Medication Review [employer]) Complete Referral to Pharmacy  PCP or Specialist appointment within 3-5 days of discharge Complete   HRI or Home Care Consult Complete   SW Recovery Care/Counseling Consult Complete   Palliative Care Screening Not Applicable   Skilled Nursing Facility Complete

## 2023-08-16 NOTE — Plan of Care (Signed)
FMTS Brief Progress Note  S: Saw patient with Dr. Rexene Alberts at night rounds.  Husband was at bedside with patient.  Patient was alert and laying comfortably in bed. Has no complaint tonight.    O: BP 103/65 (BP Location: Left Wrist)   Pulse 74   Temp 99 F (37.2 C) (Oral)   Resp 20   Wt (!) 158.4 kg   SpO2 (!) 86%   BMI 58.11 kg/m   General: Alert, morbidly obese, NAD CV: RRR, well perfused Pulm: CTAB, Good WOB on 5L [place] Abd: Soft, no distension, no tenderness   A/P: Acute on chronic hypoxic/hypercapnic respiratory failure Patient appears more alert tonight.  Currently on 5L .  RT called Arliss Journey about patient being on 11L  O2 sats and 98.  Given patient's COPD and respiratory drive patient's O2 sats goal should be around 88 . Last PCO2 was 93, patient refused blood draw for ABG. - Place BIPAP at Bedtime  - Orders reviewed. Labs for AM ordered, which was adjusted as needed.  - Continue plan per day team  Jerre Simon, MD 08/16/2023, 11:02 PM PGY-3, [facility] Night Resident  Please page [phone] with questions.

## 2023-08-17 DIAGNOSIS — J9601 Acute respiratory failure with hypoxia: Secondary | ICD-10-CM | POA: Diagnosis not present

## 2023-08-17 DIAGNOSIS — E1165 Type 2 diabetes mellitus with hyperglycemia: Secondary | ICD-10-CM

## 2023-08-17 DIAGNOSIS — Z794 Long term (current) use of insulin: Secondary | ICD-10-CM

## 2023-08-17 DIAGNOSIS — R0689 Other abnormalities of breathing: Secondary | ICD-10-CM

## 2023-08-17 DIAGNOSIS — D649 Anemia, unspecified: Secondary | ICD-10-CM | POA: Diagnosis not present

## 2023-08-17 DIAGNOSIS — I509 Heart failure, unspecified: Secondary | ICD-10-CM | POA: Diagnosis not present

## 2023-08-17 LAB — GLUCOSE, CAPILLARY
Glucose-Capillary: 208 mg/dL — ABNORMAL HIGH (ref 70–99)
Glucose-Capillary: 277 mg/dL — ABNORMAL HIGH (ref 70–99)
Glucose-Capillary: 297 mg/dL — ABNORMAL HIGH (ref 70–99)
Glucose-Capillary: 356 mg/dL — ABNORMAL HIGH (ref 70–99)

## 2023-08-17 LAB — BLOOD GAS, VENOUS
Acid-Base Excess: 36 mmol/L — ABNORMAL HIGH (ref 0.0–2.0)
Bicarbonate: 62.4 mmol/L — ABNORMAL HIGH (ref 20.0–28.0)
O2 Saturation: 92 %
Patient temperature: 37.3
pCO2, Ven: 66 mm[Hg] — ABNORMAL HIGH (ref 44–60)
pH, Ven: 7.58 — ABNORMAL HIGH (ref 7.25–7.43)
pO2, Ven: 56 mm[Hg] — ABNORMAL HIGH (ref 32–45)

## 2023-08-17 LAB — CBC
HCT: 32.4 % — ABNORMAL LOW (ref 36.0–46.0)
Hemoglobin: 9.6 g/dL — ABNORMAL LOW (ref 12.0–15.0)
MCH: 28 pg (ref 26.0–34.0)
MCHC: 29.6 g/dL — ABNORMAL LOW (ref 30.0–36.0)
MCV: 94.5 fL (ref 80.0–100.0)
Platelets: 262 10*3/uL (ref 150–400)
RBC: 3.43 MIL/uL — ABNORMAL LOW (ref 3.87–5.11)
RDW: 13.8 % (ref 11.5–15.5)
WBC: 6.7 10*3/uL (ref 4.0–10.5)
nRBC: 0 % (ref 0.0–0.2)

## 2023-08-17 LAB — BASIC METABOLIC PANEL
BUN: 21 mg/dL — ABNORMAL HIGH (ref 6–20)
CO2: >45 mmol/L — ABNORMAL HIGH (ref 22–32)
Calcium: 8.9 mg/dL (ref 8.9–10.3)
Chloride: 83 mmol/L — ABNORMAL LOW (ref 98–111)
Creatinine, Ser: 0.73 mg/dL (ref 0.44–1.00)
GFR, Estimated: >60 mL/min (ref >60)
Glucose, Bld: 229 mg/dL — ABNORMAL HIGH (ref 70–99)
Potassium: 3.1 mmol/L — ABNORMAL LOW (ref 3.5–5.1)
Sodium: 140 mmol/L (ref 135–145)

## 2023-08-17 LAB — MAGNESIUM: Magnesium: 2.2 mg/dL (ref 1.7–2.4)

## 2023-08-17 MED ORDER — MELATONIN 3 MG PO TABS
3.0000 mg | ORAL_TABLET | Freq: Every day | ORAL | Status: DC
  Administered 2023-08-17 – 2023-08-20 (×4): 3 mg via ORAL
  Filled 2023-08-17 (×4): qty 1

## 2023-08-17 MED ORDER — INSULIN ASPART 100 UNIT/ML IJ SOLN: 0.0000 [IU] | Freq: Three times a day (TID) | INTRAMUSCULAR | Status: DC

## 2023-08-17 MED ORDER — INSULIN ASPART 100 UNIT/ML IJ SOLN
0.0000 [IU] | Freq: Every day | INTRAMUSCULAR | Status: DC
  Administered 2023-08-17: 5 [IU] via SUBCUTANEOUS
  Administered 2023-08-18 – 2023-08-19 (×2): 4 [IU] via SUBCUTANEOUS
  Administered 2023-08-20: 3 [IU] via SUBCUTANEOUS

## 2023-08-17 MED ORDER — OXYCODONE HCL 5 MG PO TABS
10.0000 mg | ORAL_TABLET | Freq: Four times a day (QID) | ORAL | Status: DC | PRN
  Administered 2023-08-17 – 2023-08-21 (×11): 10 mg via ORAL
  Filled 2023-08-17 (×11): qty 2

## 2023-08-17 MED ORDER — INSULIN ASPART 100 UNIT/ML IJ SOLN
0.0000 [IU] | Freq: Three times a day (TID) | INTRAMUSCULAR | Status: DC
  Administered 2023-08-17: 5 [IU] via SUBCUTANEOUS

## 2023-08-17 MED ORDER — POTASSIUM CHLORIDE CRYS ER 20 MEQ PO TBCR
40.0000 meq | EXTENDED_RELEASE_TABLET | Freq: Once | ORAL | Status: AC
  Administered 2023-08-17: 40 meq via ORAL
  Filled 2023-08-17: qty 2

## 2023-08-17 MED ORDER — POTASSIUM CHLORIDE 10 MEQ/100ML IV SOLN
10.0000 meq | INTRAVENOUS | Status: AC
  Filled 2023-08-17: qty 100

## 2023-08-17 MED ORDER — INSULIN ASPART 100 UNIT/ML IJ SOLN
0.0000 [IU] | Freq: Three times a day (TID) | INTRAMUSCULAR | Status: DC
  Administered 2023-08-18: 2 [IU] via SUBCUTANEOUS
  Administered 2023-08-18: 3 [IU] via SUBCUTANEOUS
  Administered 2023-08-18: 4 [IU] via SUBCUTANEOUS
  Administered 2023-08-19: 3 [IU] via SUBCUTANEOUS

## 2023-08-17 MED ORDER — INSULIN GLARGINE-YFGN 100 UNIT/ML ~~LOC~~ SOLN
4.0000 [IU] | Freq: Every day | SUBCUTANEOUS | Status: DC
  Administered 2023-08-17: 4 [IU] via SUBCUTANEOUS
  Filled 2023-08-17 (×2): qty 0.04

## 2023-08-17 NOTE — Assessment & Plan Note (Addendum)
Chronic anemia suspected to be due to iron deficiency anemia.  Stable since admission with Hgb of 9.6 today. Will continue oral iron supplement. - Cont home ferrous sulfate 325 daily once off of bipap  - AM CBC

## 2023-08-17 NOTE — Progress Notes (Signed)
Pt refuseing bipap at this time. Will return later.

## 2023-08-17 NOTE — Assessment & Plan Note (Addendum)
CBG in the last 24 hours have ranged 220-280.  Patient got a total of 8 SAI in the last 24 hours. - continue home Jardiance 10 daily  - Changed to Sensitive SSI + CBGs AC & at bedtime - consider insulin regimen at discharge for better BG control

## 2023-08-17 NOTE — Progress Notes (Addendum)
Daily Progress Note Intern Pager: [phone]  Patient name: Chelsea [name] Medical record number: [id number] Date of birth: August 01, 1966 Age: 57 y.o. Gender: female  Primary Care Provider: Pcp, No Consultants: None Code Status: Full  Pt Overview and Major Events to Date:  11/14: Admitted  Assessment and Plan: Marylu Lund recall stroke is a 57 year old female with Hx of HFpEF, COPD, T2DM, A-flutter and hypothyroidism admitted for acute hypoxic respiratory failure likely secondary to OHS/OSA and HF exacerbation. Assessment & Plan Acute hypoxic respiratory failure (HCC) Suspected to be a combination of OSA/HF exacerbation.  Suspect noncompliance to home BiPAP given patient has continued to since the BiPAP treatments. Patient remained on BiPAP overnight and  Morning VBG today showed PCO2 improved .  This morning she is more alert on exam.  - Continue Bipap.  -O2 sats goal 88-92% - Cont home albuterol neb q6 prn, pulmicort neb q12, montelukast 10 nightly  - Continuous cardiac telemetry  - Continuous pulse ox - Vital signs per floor protocol - AM CBC, BMP, Mg - replete electrolytes as needed for K >4, Mg > 2  Acute exacerbation of chronic heart failure (HCC) Patient has continued ongoing diuresis since admission.  Unfortunately inconsistent with recording of UOP.  Will continue diuresis with IV Lasix for at least 1 more day.  Potassium this morning was 3.1 and repleted with 80 mEq KCL. -Continue IV Lasix 80 twice daily -Continue home spironolactone 25 mg daily -Holding home torsemide, metolazone.  To be restarted when appropriate -Strict I's and O's -Daily weight - AM BMP, Mg - replete electrolytes as needed for K >4, Mg > 2 Type 2 diabetes mellitus (HCC) CBG in the last 24 hours have ranged 220-280.  Patient got a total of 8 SAI in the last 24 hours. - continue home Jardiance 10 daily  - Changed to Sensitive SSI + CBGs AC & at bedtime - consider insulin regimen at discharge  for better BG control  Anemia Chronic anemia suspected to be due to iron deficiency anemia.  Stable since admission with Hgb of 9.6 today. Will continue oral iron supplement. - Cont home ferrous sulfate 325 daily once off of bipap  - AM CBC   Chronic and Stable Problems:  Hypothyroidism: Cont home levothyroxine 150 daily  COPD: Cont home albuterol neb q6 prn, pulmicort neb q12, montelukast 10 nightly  HLD: Cont home atorvastatin 20 nightly  RLS: Cont home Requip 0.5 every other day  GERD: Protonix 40 (formulary equivalent for home Prilosec 20) Chronic pain: holding home percocet iso acute respiratory failure on bipap  Mood: holding home Xanax prn iso acute respiratory failure on bipap  FEN/GI: Carb modified diet PPx: Home Eliquis 5 twice daily Dispo:SNF pending clinical improvement .   Subjective:  Patient   Objective: Temp:  [98.2 F (36.8 C)-99 F (37.2 C)] 99 F (37.2 C) (11/15 1917) Pulse Rate:  [69-89] 77 (11/16 0027) Resp:  [16-23] 22 (11/16 0027) BP: (100-110)/(55-68) 103/65 (11/15 1917) SpO2:  [86 %-98 %] 88 % (11/16 0027) FiO2 (%):  [40 %-60 %] 40 % (11/16 0027) Physical Exam: General: Awake, morbidly obese, NAD Cardiovascular: RRR, systolic murmur, well perfused Respiratory: Currently on BiPAP, Abdomen: Soft, nondistended, no tenderness Extremities: BLE edema persistent with chronic venous stasis  Laboratory: Most recent CBC Lab Results  Component Value Date   WBC 7.0 08/16/2023   HGB 9.6 (L) 08/16/2023   HCT 32.4 (L) 08/16/2023   MCV 96.1 08/16/2023   PLT 260 08/16/2023  Most recent BMP    Latest Ref Rng & Units 08/16/2023    4:19 AM  BMP  Glucose 70 - 99 mg/dL 161   BUN 6 - 20 mg/dL 29   Creatinine 0.96 - 1.00 mg/dL 0.45   Sodium 409 - 811 mmol/L 143   Potassium 3.5 - 5.1 mmol/L 4.1   Chloride 98 - 111 mmol/L 83   CO2 22 - 32 mmol/L >45   Calcium 8.9 - 10.3 mg/dL 9.1     VBG: pCO2 66 mmHg  Imaging/Diagnostic Tests: No new  images  Jerre Simon, MD 08/17/2023, 1:03 AM  PGY-3, [facility] FPTS Intern pager: [phone], text pages welcome Secure chat group [facility] Teaching Service

## 2023-08-17 NOTE — Assessment & Plan Note (Addendum)
Patient has continued ongoing diuresis since admission.  Unfortunately inconsistent with recording of UOP.  Will continue diuresis with IV Lasix for at least 1 more day.  Potassium this morning was 3.1 and repleted with 80 mEq KCL. -Continue IV Lasix 80 twice daily -Continue home spironolactone 25 mg daily -Holding home torsemide, metolazone.  To be restarted when appropriate -Strict I's and O's -Daily weight - AM BMP, Mg - replete electrolytes as needed for K >4, Mg > 2

## 2023-08-17 NOTE — Progress Notes (Addendum)
Pt is alert and fully oriented x 4, afebrile, stable hemodynamically, on BiPAP at night, and able to tolerate well on 5 LPM of HFNCL She was on 10 LPM before when off BiPAP yesterday. Her SPO2  86-93%. She has normal respiratory rate and efforts, no SOB, no productive cough. Lungs are clear and diminished, negative wheezing or crackles bilaterally. * Chest x-ray on 08/15/23 findings compatible with congestive heart failure/pulmonary edema with additional left retrocardiac opacity and bilateral pleural Effusions. Pt has been treated with Lasix 80 mg BID.   MD and RT appreciate SPO2 85-89% in this case due to she is COPD with acute and chronic hypoxia/hypercapnic respiratory failure. Previously on 08/16/2023, her venous blood gas PCO2 was too high at 93 mmHg.   We repeated her venous blood gas this am at 03:09 am and her PCO2 is 66 mmHg. Pt has denied arterial blood gas.    Overnight she has complained of legs pain from underlying neuropathy and restless leg syndrome. Gabapentin 400 mg TID, Oxycodone 5 mg PRN and tylenol 650 mg Q 6 hrs PRN. Pt is able to rest well after administered pain medications.   Pt had loose stool x 4 in the past 24 hours, but not diarrhea. Her laxative was on hold last night.   We concern about possibility of pressure sore. This Pt requires maximum 2+ assistant. We offered bariatric air mattress,but Pt refused. Her buttock is now pink and raw skin from bowel and urine incontinent. She has not been noncompliant with position changed q 2vhrs. Sacral foam applied. Skin care provided to keep her skin dry and protect skin integrity. We will continue to monitor.    Filiberto Pinks, RN

## 2023-08-17 NOTE — Assessment & Plan Note (Addendum)
Suspected to be a combination of OSA/HF exacerbation.  Suspect noncompliance to home BiPAP given patient has continued to since the BiPAP treatments. Patient remained on BiPAP overnight and  Morning VBG today showed PCO2 improved .  This morning she is more alert on exam.  - Continue Bipap.  -O2 sats goal 88-92% - Cont home albuterol neb q6 prn, pulmicort neb q12, montelukast 10 nightly  - Continuous cardiac telemetry  - Continuous pulse ox - Vital signs per floor protocol - AM CBC, BMP, Mg - replete electrolytes as needed for K >4, Mg > 2

## 2023-08-18 DIAGNOSIS — J9601 Acute respiratory failure with hypoxia: Secondary | ICD-10-CM | POA: Diagnosis not present

## 2023-08-18 DIAGNOSIS — B379 Candidiasis, unspecified: Secondary | ICD-10-CM | POA: Insufficient documentation

## 2023-08-18 HISTORY — DX: Candidiasis, unspecified: B37.9

## 2023-08-18 LAB — GLUCOSE, CAPILLARY
Glucose-Capillary: 218 mg/dL — ABNORMAL HIGH (ref 70–99)
Glucose-Capillary: 296 mg/dL — ABNORMAL HIGH (ref 70–99)
Glucose-Capillary: 339 mg/dL — ABNORMAL HIGH (ref 70–99)
Glucose-Capillary: 318 mg/dL — ABNORMAL HIGH (ref 70–99)

## 2023-08-18 LAB — CBC
HCT: 33.3 % — ABNORMAL LOW (ref 36.0–46.0)
Hemoglobin: 10.1 g/dL — ABNORMAL LOW (ref 12.0–15.0)
MCH: 28.2 pg (ref 26.0–34.0)
MCHC: 30.3 g/dL (ref 30.0–36.0)
MCV: 93 fL (ref 80.0–100.0)
Platelets: 307 10*3/uL (ref 150–400)
RBC: 3.58 MIL/uL — ABNORMAL LOW (ref 3.87–5.11)
RDW: 13.6 % (ref 11.5–15.5)
WBC: 7.3 10*3/uL (ref 4.0–10.5)
nRBC: 0 % (ref 0.0–0.2)

## 2023-08-18 LAB — BLOOD GAS, VENOUS
Acid-Base Excess: 26.9 mmol/L — ABNORMAL HIGH (ref 0.0–2.0)
Bicarbonate: 55.6 mmol/L — ABNORMAL HIGH (ref 20.0–28.0)
O2 Saturation: 72.2 %
Patient temperature: 37
pCO2, Ven: 73 mm[Hg] (ref 44–60)
pH, Ven: 7.49 — ABNORMAL HIGH (ref 7.25–7.43)
pO2, Ven: 42 mm[Hg] (ref 32–45)

## 2023-08-18 LAB — BASIC METABOLIC PANEL
Anion gap: 11 (ref 5–15)
BUN: 25 mg/dL — ABNORMAL HIGH (ref 6–20)
CO2: 43 mmol/L — ABNORMAL HIGH (ref 22–32)
Calcium: 8.9 mg/dL (ref 8.9–10.3)
Chloride: 84 mmol/L — ABNORMAL LOW (ref 98–111)
Creatinine, Ser: 0.88 mg/dL (ref 0.44–1.00)
GFR, Estimated: >60 mL/min (ref >60)
Glucose, Bld: 280 mg/dL — ABNORMAL HIGH (ref 70–99)
Potassium: 3.5 mmol/L (ref 3.5–5.1)
Sodium: 138 mmol/L (ref 135–145)

## 2023-08-18 LAB — MAGNESIUM: Magnesium: 2.4 mg/dL (ref 1.7–2.4)

## 2023-08-18 MED ORDER — TORSEMIDE 20 MG PO TABS
80.0000 mg | ORAL_TABLET | Freq: Two times a day (BID) | ORAL | Status: DC
  Administered 2023-08-18 – 2023-08-19 (×2): 80 mg via ORAL
  Filled 2023-08-18 (×2): qty 4

## 2023-08-18 MED ORDER — INSULIN GLARGINE-YFGN 100 UNIT/ML ~~LOC~~ SOLN
10.0000 [IU] | Freq: Every day | SUBCUTANEOUS | Status: DC
  Filled 2023-08-18: qty 0.1

## 2023-08-18 MED ORDER — INSULIN GLARGINE-YFGN 100 UNIT/ML ~~LOC~~ SOLN
6.0000 [IU] | Freq: Once | SUBCUTANEOUS | Status: AC
  Administered 2023-08-18: 6 [IU] via SUBCUTANEOUS
  Filled 2023-08-18: qty 0.06

## 2023-08-18 MED ORDER — FLUCONAZOLE 150 MG PO TABS
150.0000 mg | ORAL_TABLET | Freq: Once | ORAL | Status: AC
  Administered 2023-08-18: 150 mg via ORAL
  Filled 2023-08-18: qty 1

## 2023-08-18 MED ORDER — INSULIN GLARGINE-YFGN 100 UNIT/ML ~~LOC~~ SOLN
4.0000 [IU] | Freq: Once | SUBCUTANEOUS | Status: AC
  Administered 2023-08-18: 4 [IU] via SUBCUTANEOUS
  Filled 2023-08-18: qty 0.04

## 2023-08-18 NOTE — Progress Notes (Addendum)
Date and time results received: 08/18/23 0754   Test: PCO2   Critical Value: 27  Name of Provider Notified: Dr. Sharion Dove   Orders Received? Or Actions Taken?: MD notified, no new orders received at this time.

## 2023-08-18 NOTE — Assessment & Plan Note (Addendum)
Morning VBG mostly unchanged at 73 from 66.  Suspect this is near baseline and patient is chronically retaining CO2.  Patient alert and feeling well after nighttime BiPAP, clinically at best health since admission. -Continue BiPAP nightly -O2 with goal 88-92% -Cont home albuterol neb Q6 prn, pulmicort neb Q12, montelukast 10 nightly  -Continuous tele and pulse ox

## 2023-08-18 NOTE — Assessment & Plan Note (Signed)
Symptoms consistent with recurrent yeast infection. -One time dose diflucan -Follow up outpatient

## 2023-08-18 NOTE — Progress Notes (Signed)
Daily Progress Note Intern Pager: [phone]  Patient name: Chelsea [name] Medical record number: [id number] Date of birth: 04-May-1966 Age: 57 y.o. Gender: female  Primary Care Provider: Pcp, No Consultants: None Code Status: Full  Pt Overview and Major Events to Date:  11/14: Admitted, started IV Lasix and BiPAP 11/17: Mental status at baseline, transition to oral torsemide  Assessment and Plan: Chelsea [name] is a 57 y.o. female with a pertinent PMH of HFpEF, COPD, T2DM, A-flutter and hypothyroidism who presented with respiratory distress and was admitted for acute respiratory failure, currently improved back to baseline.  Presentation is suspected to be combination of OSA/HF exacerbation.  Possible noncompliance with home BiPAP, though there is a question if BiPAP settings are correct for patient's current needs.  She will likely require outpatient sleep study and follow up.  Now improving with diuresis and nightly BiPAP. Assessment & Plan Acute hypoxic respiratory failure (HCC) Morning VBG mostly unchanged at 73 from 66.  Suspect this is near baseline and patient is chronically retaining CO2.  Patient alert and feeling well after nighttime BiPAP, clinically at best health since admission. -Continue BiPAP nightly -O2 with goal 88-92% -Cont home albuterol neb Q6 prn, pulmicort neb Q12, montelukast 10 nightly  -Continuous tele and pulse ox Acute exacerbation of chronic heart failure (HCC) Patient has continued ongoing diuresis since admission.  UOP net negative ~7L.  Will transition to PO torsemide today and likely discharge on 80 mg BID (up from 60 mg BID previous home dose). -Transition IV Lasix 80 BID>PO torsemide 80 mg BID -Continue home spironolactone 25 mg daily -Strict I&Os -Daily weight -AM BMP, Mg; replete as needed with goal K >4, Mg > 2 Type 2 diabetes mellitus (HCC) CBGs ranging 200-300 past 24 hours with 17 units SA and 4 units LA.  Poorly  controlled. -Continue Jardiance 25 mg daily -Now on Very Sensitive SSI + CBGs ACHS -Increase LA coverage from 4 units to 10 units daily -Plan for insulin regimen at discharge for better BG control, discuss with patient for education prior to d/c Anemia Chronic anemia suspected to be due to iron deficiency anemia.  Stable since admission. Will continue oral iron supplement. -Continue home ferrous sulfate 325 daily Yeast infection Symptoms consistent with recurrent yeast infection. -One time dose diflucan -Follow up outpatient  Chronic and Stable Problems: Hypothyroidism: Cont home levothyroxine 150 daily COPD: Cont home albuterol neb q6 prn, pulmicort neb q12, montelukast 10 nightly HLD: Cont home atorvastatin 20 nightly Restless leg: Cont home Requip 0.5 every other day GERD: Protonix 40 (formulary equivalent for home Prilosec 20) Chronic pain: Holding home percocet d/t AHRF Mood: Holding home Xanax PRN  FEN/GI: Carb modified diet PPx: Eliquis 5 mg BID (home dose) Dispo: SNF tomorrow pending oral torsemide trial and BMP monitoring.  Subjective:  Patient is resting in bed this morning, feeling comfortable and the best she has since admission.  Comfortable discharging once medically ready.  Minimal shortness of breath on oxygen.  She reports some vaginal discharge, burning, and itching consistent with past yeast infections she is had.  Objective: Temp:  [97.7 F (36.5 C)-99.3 F (37.4 C)] 99 F (37.2 C) (11/17 0348) Pulse Rate:  [69-82] 69 (11/17 0348) Resp:  [19-25] 19 (11/17 0348) BP: (92-115)/(50-72) 92/50 (11/17 0348) SpO2:  [91 %-100 %] 97 % (11/17 0348) FiO2 (%):  [40 %] 40 % (11/16 1135) Weight:  [150.1 kg] 150.1 kg (11/17 0348)  Physical Exam: General: Age-appropriate, resting comfortably supine in  bed on bedpan, NAD, alert and at baseline. Cardiovascular: Regular rate and rhythm. Normal S1/S2. No murmurs, rubs, or gallops appreciated. 2+ radial pulses. Pulmonary:  Diminished breath sounds globally, auscultation limited by body habitus.  Occasional crackles heard.  No increased WOB, no accessory muscle usage on 3L O2 by [place]. No wheezes or rales. Abdominal: No tenderness to deep or light palpation. No rebound or guarding. No HSM. Skin: Warm and dry. Extremities: 1+ peripheral edema bilaterally.  BLE overlying venous stasis skin changes.  Laboratory: Most recent CBC Lab Results  Component Value Date   WBC 7.3 08/18/2023   HGB 10.1 (L) 08/18/2023   HCT 33.3 (L) 08/18/2023   MCV 93.0 08/18/2023   PLT 307 08/18/2023   Most recent BMP    Latest Ref Rng & Units 08/18/2023    2:55 AM  BMP  Glucose 70 - 99 mg/dL 960   BUN 6 - 20 mg/dL 25   Creatinine 4.54 - 1.00 mg/dL 0.98   Sodium 119 - 147 mmol/L 138   Potassium 3.5 - 5.1 mmol/L 3.5   Chloride 98 - 111 mmol/L 84   CO2 22 - 32 mmol/L 43   Calcium 8.9 - 10.3 mg/dL 8.9     Other pertinent labs: -VBG: pCO2 66>73  Rohini Jaroszewski, MD 08/18/2023, 7:38 AM  PGY-1, [place] [facility] FPTS Intern pager: [phone], text pages welcome Secure chat group [facility] Teaching Service

## 2023-08-18 NOTE — Assessment & Plan Note (Addendum)
Patient has continued ongoing diuresis since admission.  UOP net negative ~7L.  Will transition to PO torsemide today and likely discharge on 80 mg BID (up from 60 mg BID previous home dose). -Transition IV Lasix 80 BID>PO torsemide 80 mg BID -Continue home spironolactone 25 mg daily -Strict I&Os -Daily weight -AM BMP, Mg; replete as needed with goal K >4, Mg > 2

## 2023-08-18 NOTE — Plan of Care (Signed)

## 2023-08-18 NOTE — Assessment & Plan Note (Addendum)
Chronic anemia suspected to be due to iron deficiency anemia.  Stable since admission. Will continue oral iron supplement. -Continue home ferrous sulfate 325 daily

## 2023-08-18 NOTE — Assessment & Plan Note (Addendum)
CBGs ranging 200-300 past 24 hours with 17 units SA and 4 units LA.  Poorly controlled. -Continue Jardiance 25 mg daily -Now on Very Sensitive SSI + CBGs ACHS -Increase LA coverage from 4 units to 10 units daily -Plan for insulin regimen at discharge for better BG control, discuss with patient for education prior to d/c

## 2023-08-19 ENCOUNTER — Ambulatory Visit: Payer: Self-pay

## 2023-08-19 DIAGNOSIS — J9601 Acute respiratory failure with hypoxia: Secondary | ICD-10-CM | POA: Diagnosis not present

## 2023-08-19 DIAGNOSIS — E1165 Type 2 diabetes mellitus with hyperglycemia: Secondary | ICD-10-CM | POA: Diagnosis not present

## 2023-08-19 DIAGNOSIS — Z794 Long term (current) use of insulin: Secondary | ICD-10-CM | POA: Diagnosis not present

## 2023-08-19 LAB — GLUCOSE, CAPILLARY
Glucose-Capillary: 272 mg/dL — ABNORMAL HIGH (ref 70–99)
Glucose-Capillary: 328 mg/dL — ABNORMAL HIGH (ref 70–99)
Glucose-Capillary: 316 mg/dL — ABNORMAL HIGH (ref 70–99)
Glucose-Capillary: 316 mg/dL — ABNORMAL HIGH (ref 70–99)

## 2023-08-19 LAB — BASIC METABOLIC PANEL
Anion gap: 13 (ref 5–15)
BUN: 30 mg/dL — ABNORMAL HIGH (ref 6–20)
CO2: 37 mmol/L — ABNORMAL HIGH (ref 22–32)
Calcium: 8.4 mg/dL — ABNORMAL LOW (ref 8.9–10.3)
Chloride: 85 mmol/L — ABNORMAL LOW (ref 98–111)
Creatinine, Ser: 1.16 mg/dL — ABNORMAL HIGH (ref 0.44–1.00)
GFR, Estimated: 54 mL/min — ABNORMAL LOW (ref >60)
Glucose, Bld: 373 mg/dL — ABNORMAL HIGH (ref 70–99)
Potassium: 3.7 mmol/L (ref 3.5–5.1)
Sodium: 135 mmol/L (ref 135–145)

## 2023-08-19 LAB — MAGNESIUM: Magnesium: 2.3 mg/dL (ref 1.7–2.4)

## 2023-08-19 MED ORDER — INSULIN GLARGINE-YFGN 100 UNIT/ML ~~LOC~~ SOLN
15.0000 [IU] | Freq: Every day | SUBCUTANEOUS | Status: DC
  Administered 2023-08-19: 15 [IU] via SUBCUTANEOUS
  Filled 2023-08-19 (×2): qty 0.15

## 2023-08-19 MED ORDER — EMPAGLIFLOZIN 25 MG PO TABS: 25.0000 mg | ORAL_TABLET | Freq: Every day | ORAL | Status: DC

## 2023-08-19 MED ORDER — AMIODARONE HCL 200 MG PO TABS
200.0000 mg | ORAL_TABLET | Freq: Every day | ORAL | Status: DC
  Administered 2023-08-20 – 2023-08-21 (×2): 200 mg via ORAL
  Filled 2023-08-19 (×2): qty 1

## 2023-08-19 MED ORDER — TORSEMIDE 20 MG PO TABS
60.0000 mg | ORAL_TABLET | Freq: Two times a day (BID) | ORAL | Status: DC
  Administered 2023-08-20: 60 mg via ORAL
  Filled 2023-08-19 (×2): qty 3

## 2023-08-19 MED ORDER — INSULIN ASPART 100 UNIT/ML IJ SOLN: 0.0000 [IU] | Freq: Three times a day (TID) | INTRAMUSCULAR | Status: DC

## 2023-08-19 MED ORDER — ACETAMINOPHEN 650 MG RE SUPP: 650.0000 mg | Freq: Four times a day (QID) | RECTAL | Status: DC

## 2023-08-19 MED ORDER — ACETAMINOPHEN 500 MG PO TABS: 1000.0000 mg | ORAL_TABLET | Freq: Four times a day (QID) | ORAL | Status: DC

## 2023-08-19 MED ORDER — INSULIN ASPART 100 UNIT/ML IJ SOLN
0.0000 [IU] | Freq: Three times a day (TID) | INTRAMUSCULAR | Status: DC
Start: 2023-08-19 — End: 2023-08-21
  Administered 2023-08-19 (×2): 11 [IU] via SUBCUTANEOUS
  Administered 2023-08-20: 8 [IU] via SUBCUTANEOUS
  Administered 2023-08-20: 5 [IU] via SUBCUTANEOUS
  Administered 2023-08-20 – 2023-08-21 (×2): 8 [IU] via SUBCUTANEOUS
  Administered 2023-08-21: 5 [IU] via SUBCUTANEOUS

## 2023-08-19 MED ORDER — ACETAMINOPHEN 500 MG PO TABS
1000.0000 mg | ORAL_TABLET | Freq: Four times a day (QID) | ORAL | Status: DC
  Administered 2023-08-19 – 2023-08-21 (×7): 1000 mg via ORAL
  Filled 2023-08-19 (×8): qty 2

## 2023-08-19 MED ORDER — ACETAMINOPHEN 325 MG PO TABS
650.0000 mg | ORAL_TABLET | Freq: Once | ORAL | Status: AC
  Administered 2023-08-19: 650 mg via ORAL
  Filled 2023-08-19: qty 2

## 2023-08-19 MED ORDER — BASAGLAR KWIKPEN 100 UNIT/ML ~~LOC~~ SOPN: 15.0000 [IU] | PEN_INJECTOR | Freq: Every day | SUBCUTANEOUS | Status: DC

## 2023-08-19 MED ORDER — MELATONIN 3 MG PO TABS: 3.0000 mg | ORAL_TABLET | Freq: Every day | ORAL | Status: DC

## 2023-08-19 NOTE — Discharge Instructions (Addendum)
Dear Chelsea [name],  Thank you for letting us participate in your care. You were hospitalized for difficulty breathing. You used your Bipap and received diuretics to help remove extra fluid from your body.    POST-HOSPITAL & CARE INSTRUCTIONS Please take your medications as prescribed.  Go to your follow up appointments (listed below)   DOCTOR'S APPOINTMENT   No future appointments.   Take care and be well!  Family Medicine Teaching Service Inpatient Team [place]  [facility]  [address] [place], [place] [address] [phone]

## 2023-08-19 NOTE — Progress Notes (Signed)
   08/19/23 2355  BiPAP/CPAP/SIPAP  BiPAP/CPAP/SIPAP Pt Type Adult  BiPAP/CPAP/SIPAP V60  Mask Type Full face mask  Mask Size Medium  Set Rate 18 breaths/min  Respiratory Rate 20 breaths/min  IPAP 18 cmH20  EPAP 8 cmH2O  FiO2 (%) 40 %  Flow Rate 0.8 lpm  Minute Ventilation 9  Leak 0  Peak Inspiratory Pressure (PIP) 19  Tidal Volume (Vt) 521  Patient Home Equipment No  Auto Titrate No  Press High Alarm 30 cmH2O  Press Low Alarm 5 cmH2O  BiPAP/CPAP /SiPAP Vitals  Pulse Rate 67  SpO2 96 %  Bilateral Breath Sounds Diminished

## 2023-08-19 NOTE — TOC Transition Note (Deleted)
Transition of Care [facility]) - CM/SW    Patient Details  Name: Chelsea [name] MRN: [id number] Date of Birth: 12-02-1965  Transition of Care [facility]) CM/SW Contact:  Michaela Corner, LCSWA Phone Number: 08/19/2023, 1:52 PM   Clinical Narrative:  Pt staying one more night per MD. TOC will follow up.   Final next level of care: Other (comment) [facility] LTC) Barriers to Discharge: Barriers Resolved   Patient Goals and CMS Choice      Discharge Placement                Patient chooses bed at: [facility] Patient to be transferred to facility by: [facility] Name of family member notified: Pt stated she will call her partner Patient and family notified of of transfer: 08/19/23  Discharge Plan and Services Additional resources added to the After Visit Summary for   In-house Referral: Clinical Social Work                                   Social Determinants of Health (SDOH) Interventions SDOH Screenings   Food Insecurity: No Food Insecurity (08/15/2023)  Housing: Low Risk  (08/15/2023)  Recent Concern: Housing - Medium Risk (06/06/2023)  Transportation Needs: No Transportation Needs (08/15/2023)  Utilities: Not At Risk (08/15/2023)  Recent Concern: Utilities - At Risk (06/06/2023)  Alcohol Screen: Low Risk  (07/05/2022)  Financial Resource Strain: Low Risk  (07/05/2022)  Physical Activity: Insufficiently Active (03/27/2021)   Received from [facility], [facility]  Social Connections: Unknown (02/04/2022)   Received from [facility], [facility]  Stress: No Stress Concern Present (09/15/2021)   Received from [facility], [facility]  Tobacco Use: Low Risk  (08/15/2023)     Readmission Risk Interventions    07/23/2022    2:19 PM 04/09/2022    4:17 PM  Readmission Risk Prevention Plan  Transportation Screening Complete Complete  PCP or Specialist Appt within 3-5 Days  Complete  HRI or Home Care Consult  Complete  Social Work Consult for Recovery  Care Planning/Counseling  Complete  Palliative Care Screening  Not Applicable  Medication Review [employer]) Complete Referral to Pharmacy  PCP or Specialist appointment within 3-5 days of discharge Complete   HRI or Home Care Consult Complete   SW Recovery Care/Counseling Consult Complete   Palliative Care Screening Not Applicable   Skilled Nursing Facility Complete

## 2023-08-19 NOTE — Assessment & Plan Note (Addendum)
Patient breathing comfortably on 3 L LFNC, using BiPAP at night.   No concerns on telemetry overnight. Suspect baseline hypercarbia due to retention.  -Continue daytime O2 LF [place] with goal 88-92% -Continue BiPAP nightly -Cont home albuterol neb Q6 prn, pulmicort neb Q12, montelukast 10 nightly  -Continuous cardiac monitoring -Continuous pulse ox

## 2023-08-19 NOTE — Assessment & Plan Note (Addendum)
Chronic anemia suspected to be due to iron deficiency anemia.  Stable around 10 throughout admission. Iron 13 with ferritin 29 in August 2024.  -Continue home ferrous sulfate 325 daily

## 2023-08-19 NOTE — Progress Notes (Addendum)
Daily Progress Note Intern Pager: [phone]  Patient name: Chelsea [name] Medical record number: [id number] Date of birth: June 27, 1966 Age: 57 y.o. Gender: female  Primary Care Provider: Pcp, No Consultants: None Code Status: Full   Pt Overview and Major Events to Date:  11/14: Admitted to progressive with FMTS   Assessment and Plan:   Chelsea [name] is a 57 y.o. female with history of HFpEF (60%), COPD, T2DM, A flutter, and hypothyroidism presenting with acute hypoxic respiratory failure likely secondary to OHS/OSA and heart failure exacerbation.  Assessment & Plan Acute hypoxic respiratory failure (HCC) Patient breathing comfortably on 3 L LFNC, using BiPAP at night.   No concerns on telemetry overnight. Suspect baseline hypercarbia due to retention.  -Continue daytime O2 LF [place] with goal 88-92% -Continue BiPAP nightly -Cont home albuterol neb Q6 prn, pulmicort neb Q12, montelukast 10 nightly  -Continuous cardiac monitoring -Continuous pulse ox Acute exacerbation of chronic heart failure (HCC) Patient has continued ongoing diuresis since admission.  2.4 L UO in last 24 hours, with net 7.8 L neg negative since admission.  Down approx 10 kg since admission. Transitioned to PO torsemide. Slight increase in Cr to 1.16 this morning.  -Transition to home torsemide 60 mg BID -Continue home spironolactone 25 mg daily -Strict I&Os -Daily weights -AM BMP, Mg; replete as needed with goal K >4, Mg > 2 Type 2 diabetes mellitus (HCC) Poorly controlled, with CBGs ranging 200-300 past 24 hours Received 14 units SA and 10 units LA in the last 24 hours.  -Increase LA coverage to 15 units daily  -Continue Jardiance 25 mg daily -Transition to moderate SSI + CBGs ACHS -Optimize insulin regimen at discharge for better BG control, discuss with patient for education prior to d/c Anemia Chronic anemia suspected to be due to iron deficiency anemia.  Stable around 10 throughout  admission.Iron 13 with ferritin 29 in August 2024.  -Continue home ferrous sulfate 325 daily Yeast infection Patient had symptoms consistent with recurrent yeast infection. Received one time dose of diflucan 150mg  on 11/17 with symptom improvement. Plan to follow up outpatient.   Chronic and Stable Issues: Hypothyroidism: Cont home levothyroxine 150 daily COPD: Cont home albuterol neb q6 prn, pulmicort neb q12, montelukast 10 nightly HLD: Cont home atorvastatin 20 nightly Restless leg: Cont home Requip 0.5 every other day GERD: Protonix 40 (formulary equivalent for home Prilosec 20) Chronic pain: Cont oxycodone 10 mg q6 PRN (home dose) Mood: Cont home wellbutrin 100 BID, Holding home Xanax PRN   FEN/GI: Carb modified diet PPx: Eliquis 5 mg BID (home dose) Dispo: SNF pending clinical stability   Subjective:  Patient notes some twitching overnight while sleeping.  No chest pain, difficulty breathing, or headache.  She has been tolerating p.o. well, with some mild nausea, no vomiting or diarrhea.  Objective: Temp:  [97.8 F (36.6 C)-98.8 F (37.1 C)] 97.8 F (36.6 C) (11/18 0310) Pulse Rate:  [64-83] 67 (11/18 0310) Resp:  [16-21] 16 (11/18 0310) BP: (91-119)/(59-77) 119/77 (11/18 0310) SpO2:  [88 %-98 %] 98 % (11/18 0310) FiO2 (%):  [40 %] 40 % (11/18 0024) Weight:  [148.9 kg] 148.9 kg (11/18 0321) Physical Exam: General: Well-appearing. Resting comfortably in room. CV: Harsh systolic murmur. No extra heart sounds. Warm and well-perfused. Stable LE edema.  Pulm: Breathing comfortably on 3L O2 LFNC. CTAB. No increased WOB. Abd: Soft, non-tender. Distention secondary to body habitus.  Skin:  Warm, dry. Psych: Pleasant and appropriate.   Laboratory: Most  recent CBC Lab Results  Component Value Date   WBC 7.3 08/18/2023   HGB 10.1 (L) 08/18/2023   HCT 33.3 (L) 08/18/2023   MCV 93.0 08/18/2023   PLT 307 08/18/2023   Most recent BMP    Latest Ref Rng & Units 08/19/2023     4:37 AM  BMP  Glucose 70 - 99 mg/dL 213   BUN 6 - 20 mg/dL 30   Creatinine 0.86 - 1.00 mg/dL 5.78   Sodium 469 - 629 mmol/L 135   Potassium 3.5 - 5.1 mmol/L 3.7   Chloride 98 - 111 mmol/L 85   CO2 22 - 32 mmol/L 37   Calcium 8.9 - 10.3 mg/dL 8.4     [name], MD 08/19/2023, 7:12 AM  PGY-1, [facility] FPTS Intern pager: [phone], text pages welcome Secure chat group [facility] Teaching Service

## 2023-08-19 NOTE — Assessment & Plan Note (Addendum)
Patient has continued ongoing diuresis since admission.  2.4 L UO in last 24 hours, with net 7.8 L neg negative since admission.  Down approx 10 kg since admission. Transitioned to PO torsemide. Slight increase in Cr to 1.16 this morning.  -Transition to home torsemide 60 mg BID -Continue home spironolactone 25 mg daily -Strict I&Os -Daily weights -AM BMP, Mg; replete as needed with goal K >4, Mg > 2

## 2023-08-19 NOTE — Assessment & Plan Note (Addendum)
Patient had symptoms consistent with recurrent yeast infection. Received one time dose of diflucan 150mg  on 11/17 with symptom improvement. Plan to follow up outpatient.

## 2023-08-19 NOTE — Discharge Summary (Incomplete Revision)
Family Medicine Teaching [facility] Discharge Summary  Patient name: Chelsea [name] Medical record number: [id number] Date of birth: Apr 02, 1966 Age: 57 y.o. Gender: female Date of Admission: 08/15/2023  Date of Discharge: 08/19/23 Admitting Physician: [name], MD  Primary Care Provider: Pcp, No Consultants: None  Indication for Hospitalization: Acute hypoxic respiratory failure   Discharge Diagnoses/Problem List:  Principal Problem for Admission: Acute hypoxic respiratory failure  Other Problems addressed during stay:  Principal Problem:   Acute hypoxic respiratory failure (HCC) Active Problems:   Acute exacerbation of chronic heart failure (HCC)   Type 2 diabetes mellitus (HCC)   Anemia   Hypercapnemia   Yeast infection   Brief Hospital Course:  Chelsea [name] is a 57 y.o. year old with a history of HFpEF, COPD, T2DM, a flutter, and hypothyroidism who was admitted to the [facility] Medicine Teaching service for acute on chronic hypoxic and hypercarbic respiratory failure.  Acute on chronic hypoxic and hypercarbic respiratory failure Patient sent by SNF for hypoxia to the 70s and increased sleepiness. In the ED, patient with tachypnea, hypercapnia (96.6) and mild acidosis (pH 7.349). Initial workup with elevated BNP (243.8) and CXR with pulmonary edema and bilateral effusions. Patient placed on bipap with symptomatic improvement. 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 By time of discharge, patient was satting well on 3L [place] during the day and returned to baseline nightly bipap usage.   HFpEF exacerbation BNP elevated to 243 on arrival. CXR with pulmonary edema and bilateral pleural effusions.Recent echo (Aug 2024) with normal LVEF and diastolic function.  During admission, patient received IV diuretics and transitioned to PO diuretics by time of discharge. Patient down 10 kg with net 7.8L UOP from day  of admission. Continued home torsemide 60 mg twice daily at discharge.  Type 2 diabetes mellitus A1c 9.3 during admission.  Continued home Jardiance 25 mg daily and given insulin glargine with SSI throughout admission. BG ranging in the 200-300s throughout admission.Discharged on 15 u long acting daily.   Yeast infection Patient described some vaginal discharge, burning, and itching.  Has history of yeast infections.  Received Diflucan 150 mg once during admission with symptomatic improvement.  Other chronic conditions were medically managed with home medications and formulary alternatives as necessary (hypothyroidism, COPD, HLD, restless leg syndrome, GERD, chronic pain, GAD).  PCP Follow-up Recommendations: CT chest in next 2 weeks for resolution of previously identified nodular infiltrates  Sleep medicine follow-up to recalibrate BiPAP settings Follow-up on vaginal yeast infection Follow Cr - mildly elevated (1.16) during admission Held metolazone during admission. Reassess need.    Disposition: Return to SNF  Discharge Condition: Improved and stable  Discharge Exam:  Vitals:   08/19/23 1008 08/19/23 1203  BP:  111/80  Pulse:  77  Resp:  17  Temp:    SpO2: 91% 90%   General: Well-appearing. Resting comfortably in room. CV: Harsh systolic murmur. No extra heart sounds. Warm and well-perfused. Stable LE edema.  Pulm: Breathing comfortably on 3L O2 LFNC. CTAB. No increased WOB. Abd: Soft, non-tender. Distention secondary to body habitus.  Skin:  Warm, dry. Psych: Pleasant and appropriate.   Significant Procedures: none  Significant Labs and Imaging:  Recent Labs  Lab 08/18/23 0255  WBC 7.3  HGB 10.1*  HCT 33.3*  PLT 307   Recent Labs  Lab 08/18/23 0255 08/19/23 0437  NA 138 135  K 3.5 3.7  CL 84* 85*  CO2 43* 37*  GLUCOSE 280* 373*  BUN 25* 30*  CREATININE 0.88 1.16*  CALCIUM 8.9 8.4*  MG 2.4 2.3   BNP: 243.8 CXR: pulmonary edema and bilateral pleural  effusions  Results/Tests Pending at Time of Discharge: none  Discharge Medications:  Allergies as of 08/19/2023       Reactions   Iodinated Contrast Media Anaphylaxis   Penicillins Anaphylaxis   Patient tolerates cefepime (08/2017)   Insulin Lispro Other (See Comments)   Adder per MAR from SNF   Minoxidil Hives        Medication List     STOP taking these medications    ALPRAZolam 0.25 MG tablet Commonly known as: XANAX   hydrOXYzine 25 MG tablet Commonly known as: ATARAX   insulin aspart 100 UNIT/ML injection Commonly known as: novoLOG   metolazone 2.5 MG tablet Commonly known as: ZAROXOLYN       TAKE these medications    albuterol (2.5 MG/3ML) 0.083% nebulizer solution Commonly known as: PROVENTIL Take 3 mLs (2.5 mg total) by nebulization every 6 (six) hours as needed for wheezing or shortness of breath (I50.32). What changed: Another medication with the same name was removed. Continue taking this medication, and follow the directions you see here.   amiodarone 200 MG tablet Commonly known as: PACERONE Take 1 tablet (200 mg total) by mouth daily.   ammonium lactate 12 % lotion Commonly known as: LAC-HYDRIN Apply 1 Application topically at bedtime.   atorvastatin 20 MG tablet Commonly known as: LIPITOR Take 20 mg by mouth every evening.   Basaglar KwikPen 100 UNIT/ML Inject 15 Units into the skin daily.   budesonide 0.5 MG/2ML nebulizer solution Commonly known as: PULMICORT Inhale 0.5 mg into the lungs every 12 (twelve) hours.   buPROPion ER 100 MG 12 hr tablet Commonly known as: WELLBUTRIN SR Take 1 tablet (100 mg total) by mouth 2 (two) times daily.   Cholecalciferol 125 MCG (5000 UT) Tabs Take 1 tablet (5,000 Units total) by mouth daily.   Eliquis 5 MG Tabs tablet Generic drug: apixaban Take 5 mg by mouth 2 (two) times daily.   empagliflozin 25 MG Tabs tablet Commonly known as: JARDIANCE Take 1 tablet (25 mg total) by mouth  daily. Start taking on: August 20, 2023 What changed:  medication strength how much to take   ferrous sulfate 325 (65 FE) MG EC tablet Take 325 mg by mouth daily.   gabapentin 400 MG capsule Commonly known as: NEURONTIN Take 400 mg by mouth 3 (three) times daily.   levothyroxine 150 MCG tablet Commonly known as: SYNTHROID Take 1 tablet (150 mcg total) by mouth daily at 6 (six) AM. What changed: when to take this   loperamide 2 MG capsule Commonly known as: IMODIUM Take 2 mg by mouth every 4 (four) hours as needed for diarrhea or loose stools.   melatonin 3 MG Tabs tablet Take 1 tablet (3 mg total) by mouth at bedtime.   montelukast 10 MG tablet Commonly known as: SINGULAIR Take 10 mg by mouth at bedtime.   multivitamin tablet Take 1 tablet by mouth daily.   NON FORMULARY Apply BIPAP every night at bedtime.   nystatin cream Commonly known as: MYCOSTATIN Apply topically 2 (two) times daily.   omeprazole 20 MG capsule Commonly known as: PRILOSEC Take 20 mg by mouth daily.   oxyCODONE-acetaminophen 10-325 MG tablet Commonly known as: PERCOCET Take 1 tablet by mouth every 6 (six) hours.   OXYGEN Inhale 5 L/min into the lungs in the  morning and at bedtime. Connect to BIPAP qhs   potassium chloride SA 20 MEQ tablet Commonly known as: KLOR-CON M Take 2 tablets (40 mEq total) by mouth daily.   rOPINIRole 0.5 MG tablet Commonly known as: REQUIP Take 1 tablet (0.5 mg total) by mouth at bedtime.   spironolactone 25 MG tablet Commonly known as: ALDACTONE Take 1 tablet (25 mg total) by mouth daily.   torsemide 20 MG tablet Commonly known as: DEMADEX Take 60 mg by mouth 2 (two) times daily.        Discharge Instructions: Please refer to Patient Instructions section of EMR for full details.  Patient was counseled important signs and symptoms that should prompt return to medical care, changes in medications, dietary instructions, activity restrictions, and  follow up appointments.   Follow-Up Appointments: Please schedule a follow up appointment with your PCP in the next week.    [name], MD 08/19/2023, 1:43 PM PGY-1, [facility]

## 2023-08-19 NOTE — TOC Transition Note (Deleted)
Transition of Care [facility]) - CM/SW Discharge Note   Patient Details  Name: Chelsea [name] MRN: [id number] Date of Birth: 04-21-1966  [facility] of [facility] CM/SW Contact:  Michaela Corner, LCSWA Phone Number: 08/19/2023, 1:37 PM   Clinical Narrative:    Patient will DC to: [facility] Anticipated DC date: 08/19/2023 Family notified: Pt stated she will call her partner Transport by: Sharin Mons   Per MD patient ready for DC to [facility]. RN to call report prior to discharge [phone]). RN, patient, patient's family, and facility notified of DC. Discharge Summary and FL2 sent to facility. DC packet on chart. Ambulance transport requested for patient.   CSW will sign off for now as social work intervention is no longer needed. Please consult [place] again if new needs arise.    Barriers to Discharge: Continued Medical Work up   Patient Goals and CMS Choice      Discharge Placement                         Discharge Plan and Services Additional resources added to the After Visit Summary for   In-house Referral: Clinical Social Work                                   Social Determinants of Health (SDOH) Interventions SDOH Screenings   Food Insecurity: No Food Insecurity (08/15/2023)  Housing: Low Risk  (08/15/2023)  Recent Concern: Housing - Medium Risk (06/06/2023)  Transportation Needs: No Transportation Needs (08/15/2023)  Utilities: Not At Risk (08/15/2023)  Recent Concern: Utilities - At Risk (06/06/2023)  Alcohol Screen: Low Risk  (07/05/2022)  Financial Resource Strain: Low Risk  (07/05/2022)  Physical Activity: Insufficiently Active (03/27/2021)   Received from [facility]  Social Connections: Unknown (02/04/2022)   Received from [facility], [facility]  Stress: No Stress Concern Present (09/15/2021)   Received from [facility]  Tobacco Use: Low Risk  (08/15/2023)      Readmission Risk Interventions    07/23/2022    2:19 PM 04/09/2022    4:17 PM  Readmission Risk Prevention Plan  Transportation Screening Complete Complete  PCP or Specialist Appt within 3-5 Days  Complete  HRI or Home Care Consult  Complete  Social Work Consult for Recovery Care Planning/Counseling  Complete  Palliative Care Screening  Not Applicable  Medication Review [employer]) Complete Referral to Pharmacy  PCP or Specialist appointment within 3-5 days of discharge Complete   HRI or Home Care Consult Complete   SW Recovery Care/Counseling Consult Complete   Palliative Care Screening Not Applicable   Skilled Nursing Facility Complete

## 2023-08-19 NOTE — Assessment & Plan Note (Addendum)
Poorly controlled, with CBGs ranging 200-300 past 24 hours Received 14 units SA and 10 units LA in the last 24 hours.  -Increase LA coverage to 15 units daily  -Continue Jardiance 25 mg daily -Transition to moderate SSI + CBGs ACHS -Optimize insulin regimen at discharge for better BG control, discuss with patient for education prior to d/c

## 2023-08-19 NOTE — Discharge Summary (Deleted)
Family Medicine Teaching [facility] Discharge Summary  Patient name: Chelsea [name] Medical record number: [id number] Date of birth: Apr 02, 1966 Age: 57 y.o. Gender: female Date of Admission: 08/15/2023  Date of Discharge: 08/19/23 Admitting Physician: [name], MD  Primary Care Provider: Pcp, No Consultants: None  Indication for Hospitalization: Acute hypoxic respiratory failure   Discharge Diagnoses/Problem List:  Principal Problem for Admission: Acute hypoxic respiratory failure  Other Problems addressed during stay:  Principal Problem:   Acute hypoxic respiratory failure (HCC) Active Problems:   Acute exacerbation of chronic heart failure (HCC)   Type 2 diabetes mellitus (HCC)   Anemia   Hypercapnemia   Yeast infection   Brief Hospital Course:  Chelsea [name] is a 57 y.o. year old with a history of HFpEF, COPD, T2DM, a flutter, and hypothyroidism who was admitted to the [facility] Medicine Teaching service for acute on chronic hypoxic and hypercarbic respiratory failure.  Acute on chronic hypoxic and hypercarbic respiratory failure Patient sent by SNF for hypoxia to the 70s and increased sleepiness. In the ED, patient with tachypnea, hypercapnia (96.6) and mild acidosis (pH 7.349). Initial workup with elevated BNP (243.8) and CXR with pulmonary edema and bilateral effusions. Patient placed on bipap with symptomatic improvement. 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 By time of discharge, patient was satting well on 3L [place] during the day and returned to baseline nightly bipap usage.   HFpEF exacerbation BNP elevated to 243 on arrival. CXR with pulmonary edema and bilateral pleural effusions.Recent echo (Aug 2024) with normal LVEF and diastolic function.  During admission, patient received IV diuretics and transitioned to PO diuretics by time of discharge. Patient down 10 kg with net 7.8L UOP from day  of admission. Continued home torsemide 60 mg twice daily at discharge.  Type 2 diabetes mellitus A1c 9.3 during admission.  Continued home Jardiance 25 mg daily and given insulin glargine with SSI throughout admission. BG ranging in the 200-300s throughout admission.Discharged on 15 u long acting daily.   Yeast infection Patient described some vaginal discharge, burning, and itching.  Has history of yeast infections.  Received Diflucan 150 mg once during admission with symptomatic improvement.  Other chronic conditions were medically managed with home medications and formulary alternatives as necessary (hypothyroidism, COPD, HLD, restless leg syndrome, GERD, chronic pain, GAD).  PCP Follow-up Recommendations: CT chest in next 2 weeks for resolution of previously identified nodular infiltrates  Sleep medicine follow-up to recalibrate BiPAP settings Follow-up on vaginal yeast infection Follow Cr - mildly elevated (1.16) during admission Held metolazone during admission. Reassess need.    Disposition: Return to SNF  Discharge Condition: Improved and stable  Discharge Exam:  Vitals:   08/19/23 1008 08/19/23 1203  BP:  111/80  Pulse:  77  Resp:  17  Temp:    SpO2: 91% 90%   General: Well-appearing. Resting comfortably in room. CV: Harsh systolic murmur. No extra heart sounds. Warm and well-perfused. Stable LE edema.  Pulm: Breathing comfortably on 3L O2 LFNC. CTAB. No increased WOB. Abd: Soft, non-tender. Distention secondary to body habitus.  Skin:  Warm, dry. Psych: Pleasant and appropriate.   Significant Procedures: none  Significant Labs and Imaging:  Recent Labs  Lab 08/18/23 0255  WBC 7.3  HGB 10.1*  HCT 33.3*  PLT 307   Recent Labs  Lab 08/18/23 0255 08/19/23 0437  NA 138 135  K 3.5 3.7  CL 84* 85*  CO2 43* 37*  GLUCOSE 280* 373*  BUN 25* 30*  CREATININE 0.88 1.16*  CALCIUM 8.9 8.4*  MG 2.4 2.3   BNP: 243.8 CXR: pulmonary edema and bilateral pleural  effusions  Results/Tests Pending at Time of Discharge: none  Discharge Medications:  Allergies as of 08/19/2023       Reactions   Iodinated Contrast Media Anaphylaxis   Penicillins Anaphylaxis   Patient tolerates cefepime (08/2017)   Insulin Lispro Other (See Comments)   Adder per MAR from SNF   Minoxidil Hives        Medication List     STOP taking these medications    ALPRAZolam 0.25 MG tablet Commonly known as: XANAX   hydrOXYzine 25 MG tablet Commonly known as: ATARAX   insulin aspart 100 UNIT/ML injection Commonly known as: novoLOG   metolazone 2.5 MG tablet Commonly known as: ZAROXOLYN       TAKE these medications    albuterol (2.5 MG/3ML) 0.083% nebulizer solution Commonly known as: PROVENTIL Take 3 mLs (2.5 mg total) by nebulization every 6 (six) hours as needed for wheezing or shortness of breath (I50.32). What changed: Another medication with the same name was removed. Continue taking this medication, and follow the directions you see here.   amiodarone 200 MG tablet Commonly known as: PACERONE Take 1 tablet (200 mg total) by mouth daily.   ammonium lactate 12 % lotion Commonly known as: LAC-HYDRIN Apply 1 Application topically at bedtime.   atorvastatin 20 MG tablet Commonly known as: LIPITOR Take 20 mg by mouth every evening.   Basaglar KwikPen 100 UNIT/ML Inject 15 Units into the skin daily.   budesonide 0.5 MG/2ML nebulizer solution Commonly known as: PULMICORT Inhale 0.5 mg into the lungs every 12 (twelve) hours.   buPROPion ER 100 MG 12 hr tablet Commonly known as: WELLBUTRIN SR Take 1 tablet (100 mg total) by mouth 2 (two) times daily.   Cholecalciferol 125 MCG (5000 UT) Tabs Take 1 tablet (5,000 Units total) by mouth daily.   Eliquis 5 MG Tabs tablet Generic drug: apixaban Take 5 mg by mouth 2 (two) times daily.   empagliflozin 25 MG Tabs tablet Commonly known as: JARDIANCE Take 1 tablet (25 mg total) by mouth  daily. Start taking on: August 20, 2023 What changed:  medication strength how much to take   ferrous sulfate 325 (65 FE) MG EC tablet Take 325 mg by mouth daily.   gabapentin 400 MG capsule Commonly known as: NEURONTIN Take 400 mg by mouth 3 (three) times daily.   levothyroxine 150 MCG tablet Commonly known as: SYNTHROID Take 1 tablet (150 mcg total) by mouth daily at 6 (six) AM. What changed: when to take this   loperamide 2 MG capsule Commonly known as: IMODIUM Take 2 mg by mouth every 4 (four) hours as needed for diarrhea or loose stools.   melatonin 3 MG Tabs tablet Take 1 tablet (3 mg total) by mouth at bedtime.   montelukast 10 MG tablet Commonly known as: SINGULAIR Take 10 mg by mouth at bedtime.   multivitamin tablet Take 1 tablet by mouth daily.   NON FORMULARY Apply BIPAP every night at bedtime.   nystatin cream Commonly known as: MYCOSTATIN Apply topically 2 (two) times daily.   omeprazole 20 MG capsule Commonly known as: PRILOSEC Take 20 mg by mouth daily.   oxyCODONE-acetaminophen 10-325 MG tablet Commonly known as: PERCOCET Take 1 tablet by mouth every 6 (six) hours.   OXYGEN Inhale 5 L/min into the lungs in the  morning and at bedtime. Connect to BIPAP qhs   potassium chloride SA 20 MEQ tablet Commonly known as: KLOR-CON M Take 2 tablets (40 mEq total) by mouth daily.   rOPINIRole 0.5 MG tablet Commonly known as: REQUIP Take 1 tablet (0.5 mg total) by mouth at bedtime.   spironolactone 25 MG tablet Commonly known as: ALDACTONE Take 1 tablet (25 mg total) by mouth daily.   torsemide 20 MG tablet Commonly known as: DEMADEX Take 60 mg by mouth 2 (two) times daily.        Discharge Instructions: Please refer to Patient Instructions section of EMR for full details.  Patient was counseled important signs and symptoms that should prompt return to medical care, changes in medications, dietary instructions, activity restrictions, and  follow up appointments.   Follow-Up Appointments: Please schedule a follow up appointment with your PCP in the next week.    [name], MD 08/19/2023, 1:43 PM PGY-1, [facility]

## 2023-08-19 NOTE — TOC Progression Note (Signed)
Transition of Care [facility]) - Progression Note    Patient Details  Name: Chelsea [name] MRN: [id number] Date of Birth: Jan 10, 1966  Transition of Care [facility]) CM/SW Contact  Michaela Corner, [place] Phone Number: 08/19/2023, 10:53 AM  Clinical Narrative:   CSW called Kia at [facility] and they will accept pt back when ready. CSW spoke with pt and she is agreeable to go back. Pt will need PTAR when ready to dc. Pt states she will call her husband.     Expected Discharge Plan: Skilled Nursing Facility Barriers to Discharge: Continued Medical Work up  Expected Discharge Plan and Services In-house Referral: Clinical Social Work     Living arrangements for the past 2 months:  [facility] LTC facility)                                       Social Determinants of Health (SDOH) Interventions SDOH Screenings   Food Insecurity: No Food Insecurity (08/15/2023)  Housing: Low Risk  (08/15/2023)  Recent Concern: Housing - Medium Risk (06/06/2023)  Transportation Needs: No Transportation Needs (08/15/2023)  Utilities: Not At Risk (08/15/2023)  Recent Concern: Utilities - At Risk (06/06/2023)  Alcohol Screen: Low Risk  (07/05/2022)  Financial Resource Strain: Low Risk  (07/05/2022)  Physical Activity: Insufficiently Active (03/27/2021)   Received from [facility]  Social Connections: Unknown (02/04/2022)   Received from [facility], [facility]  Stress: No Stress Concern Present (09/15/2021)   Received from [facility], [facility]  Tobacco Use: Low Risk  (08/15/2023)    Readmission Risk Interventions    07/23/2022    2:19 PM 04/09/2022    4:17 PM  Readmission Risk Prevention Plan  Transportation Screening Complete Complete  PCP or Specialist Appt within 3-5 Days  Complete  HRI or Home Care Consult  Complete  Social Work Consult for Recovery Care Planning/Counseling  Complete  Palliative Care Screening  Not Applicable  Medication Review Oceanographer) Complete  Referral to Pharmacy  PCP or Specialist appointment within 3-5 days of discharge Complete   HRI or Home Care Consult Complete   SW Recovery Care/Counseling Consult Complete   Palliative Care Screening Not Applicable   Skilled Nursing Facility Complete

## 2023-08-20 DIAGNOSIS — R7989 Other specified abnormal findings of blood chemistry: Secondary | ICD-10-CM | POA: Insufficient documentation

## 2023-08-20 DIAGNOSIS — Z794 Long term (current) use of insulin: Secondary | ICD-10-CM | POA: Diagnosis not present

## 2023-08-20 DIAGNOSIS — J9601 Acute respiratory failure with hypoxia: Secondary | ICD-10-CM | POA: Diagnosis not present

## 2023-08-20 DIAGNOSIS — E1165 Type 2 diabetes mellitus with hyperglycemia: Secondary | ICD-10-CM | POA: Diagnosis not present

## 2023-08-20 LAB — BASIC METABOLIC PANEL
Anion gap: 13 (ref 5–15)
BUN: 41 mg/dL — ABNORMAL HIGH (ref 6–20)
CO2: 36 mmol/L — ABNORMAL HIGH (ref 22–32)
Calcium: 8.7 mg/dL — ABNORMAL LOW (ref 8.9–10.3)
Chloride: 87 mmol/L — ABNORMAL LOW (ref 98–111)
Creatinine, Ser: 1.43 mg/dL — ABNORMAL HIGH (ref 0.44–1.00)
GFR, Estimated: 42 mL/min — ABNORMAL LOW (ref >60)
Glucose, Bld: 241 mg/dL — ABNORMAL HIGH (ref 70–99)
Potassium: 3.9 mmol/L (ref 3.5–5.1)
Sodium: 136 mmol/L (ref 135–145)

## 2023-08-20 LAB — GLUCOSE, CAPILLARY
Glucose-Capillary: 218 mg/dL — ABNORMAL HIGH (ref 70–99)
Glucose-Capillary: 284 mg/dL — ABNORMAL HIGH (ref 70–99)
Glucose-Capillary: 262 mg/dL — ABNORMAL HIGH (ref 70–99)
Glucose-Capillary: 262 mg/dL — ABNORMAL HIGH (ref 70–99)

## 2023-08-20 LAB — MAGNESIUM: Magnesium: 2.6 mg/dL — ABNORMAL HIGH (ref 1.7–2.4)

## 2023-08-20 MED ORDER — POTASSIUM CHLORIDE CRYS ER 20 MEQ PO TBCR
20.0000 meq | EXTENDED_RELEASE_TABLET | Freq: Once | ORAL | Status: AC
  Administered 2023-08-20: 20 meq via ORAL
  Filled 2023-08-20: qty 1

## 2023-08-20 MED ORDER — INSULIN GLARGINE-YFGN 100 UNIT/ML ~~LOC~~ SOLN
25.0000 [IU] | Freq: Every day | SUBCUTANEOUS | Status: DC
  Administered 2023-08-20: 25 [IU] via SUBCUTANEOUS
  Filled 2023-08-20 (×2): qty 0.25

## 2023-08-20 NOTE — Assessment & Plan Note (Addendum)
Patient had symptoms consistent with recurrent yeast infection. Received one time dose of diflucan 150mg  on 11/17 with symptom improvement. Plan to follow up outpatient.

## 2023-08-20 NOTE — Assessment & Plan Note (Addendum)
Patient breathing comfortably on 2.5 L LFNC, using BiPAP at night.   No concerns on telemetry overnight. Suspect baseline hypercarbia due to retention.  -Continue daytime O2 LF [place] with goal 88-92% -Continue BiPAP nightly -Cont home albuterol neb Q6 prn, pulmicort neb Q12, montelukast 10 nightly  -Continuous cardiac monitoring -Continuous pulse ox

## 2023-08-20 NOTE — Progress Notes (Signed)
Daily Progress Note Intern Pager: [phone]  Patient name: Chelsea [name] Medical record number: [id number] Date of birth: 08/31/1966 Age: 57 y.o. Gender: female  Primary Care Provider: Pcp, No Consultants: None Code Status: Full   Pt Overview and Major Events to Date:  11/14: Admitted to progressive with FMTS   Assessment and Plan:   Chelsea [name] is a 57 y.o. female with history of HFpEF (60%), COPD, T2DM, A flutter, and hypothyroidism presenting with acute hypoxic respiratory failure likely secondary to OHS/OSA and heart failure exacerbation.  Assessment & Plan Acute hypoxic respiratory failure (HCC) Patient breathing comfortably on 2.5 L LFNC, using BiPAP at night.   No concerns on telemetry overnight. Suspect baseline hypercarbia due to retention.  -Continue daytime O2 LF [place] with goal 88-92% -Continue BiPAP nightly -Cont home albuterol neb Q6 prn, pulmicort neb Q12, montelukast 10 nightly  -Continuous cardiac monitoring -Continuous pulse ox Acute exacerbation of chronic heart failure (HCC) Patient receiving diuresis throughout admission.  306 L UO in last 24 hours, with net 10.2 L neg negative since admission.  Down approx 10 kg since admission. Transitioned to PO torsemide on 11/19. Cr increased 1.16 > 1.43 this morning.  -Holding torsemide iso rising creatinine  -Continue home spironolactone 25 mg daily -Strict I&Os -Daily weights -AM BMP, Mg; replete as needed with goal K >4, Mg > 2 Type 2 diabetes mellitus (HCC) Poorly controlled, with CBGs ranging 200-300 past 24 hours. Received 31 units SA and 15 units LA in the last 24 hours.  -Increase LA coverage to 25 units daily  -Continue Jardiance 25 mg daily -Continue moderate SSI + CBGs ACHS -Optimize insulin regimen at discharge for better BG control, discuss with patient for education prior to d/c Elevated serum creatinine Patient's Cr elevated 1.16 (11/18) > 1.43 (11/19). Baseline normal. Evening  torsemide was held yesterday. Suspect AKI secondary to significant diuresis.  - Holding torsemide  - AM BMP Anemia Chronic anemia suspected to be due to iron deficiency anemia.  Stable around 10 throughout admission. Iron 13 with ferritin 29 in August 2024.  -Continue home ferrous sulfate 325 daily Yeast infection Patient had symptoms consistent with recurrent yeast infection. Received one time dose of diflucan 150mg  on 11/17 with symptom improvement. Plan to follow up outpatient.  Chronic and Stable Issues: Hypothyroidism: Cont home levothyroxine 150 daily COPD: Cont home albuterol neb q6 prn, pulmicort neb q12, montelukast 10 nightly HLD: Cont home atorvastatin 20 nightly Restless leg: Cont home Requip 0.5 every other day GERD: Protonix 40 (formulary equivalent for home Prilosec 20) Chronic pain: Cont oxycodone 10 mg q6 PRN (home dose) Mood: Cont home wellbutrin 100 BID, Holding home Xanax PRN   FEN/GI: Carb modified diet PPx: Eliquis 5 mg BID (home dose) Dispo: SNF pending clinical stability   Subjective:  Patient feeling okay this morning.  Reports continued jerking movements, with improved pain.  No difficulty breathing or chest pain.  Eating and drinking well.  She is feeling more comfortable about going home.  Objective: Temp:  [97.5 F (36.4 C)-99.8 F (37.7 C)] 97.5 F (36.4 C) (11/19 0349) Pulse Rate:  [60-99] 65 (11/19 0349) Resp:  [15-22] 15 (11/19 0349) BP: (96-111)/(62-80) 102/77 (11/19 0349) SpO2:  [90 %-99 %] 95 % (11/19 0349) FiO2 (%):  [40 %] 40 % (11/19 0319) Weight:  [149.5 kg] 149.5 kg (11/19 0349) Physical Exam: General: No acute distress. Resting comfortably.  Cardiovascular: Harsh systolic murmur. No extra heart sounds. Warm and well-perfused.  Respiratory:  Breathing comfortably on 2.5 L O2. Difficult to assess breath sounds due to body habitus, no crackles appreciated. No increased WOB.  Abdomen: BS present. Soft. Nontender.  Extremities: Stable BLE  edema.   Laboratory: Most recent CBC Lab Results  Component Value Date   WBC 7.3 08/18/2023   HGB 10.1 (L) 08/18/2023   HCT 33.3 (L) 08/18/2023   MCV 93.0 08/18/2023   PLT 307 08/18/2023   Most recent BMP    Latest Ref Rng & Units 08/20/2023    4:35 AM  BMP  Glucose 70 - 99 mg/dL 952   BUN 6 - 20 mg/dL 41   Creatinine 8.41 - 1.00 mg/dL 3.24   Sodium 401 - 027 mmol/L 136   Potassium 3.5 - 5.1 mmol/L 3.9   Chloride 98 - 111 mmol/L 87   CO2 22 - 32 mmol/L 36   Calcium 8.9 - 10.3 mg/dL 8.7     [name], MD 08/20/2023, 7:16 AM  PGY-1, [facility] FPTS Intern pager: [phone], text pages welcome Secure chat group [facility] Teaching Service

## 2023-08-20 NOTE — Assessment & Plan Note (Addendum)
Patient's Cr elevated 1.16 (11/18) > 1.43 (11/19). Baseline normal. Evening torsemide was held yesterday. Suspect AKI secondary to significant diuresis.  - Holding torsemide  - AM BMP

## 2023-08-20 NOTE — Assessment & Plan Note (Addendum)
Poorly controlled, with CBGs ranging 200-300 past 24 hours. Received 31 units SA and 15 units LA in the last 24 hours.  -Increase LA coverage to 25 units daily  -Continue Jardiance 25 mg daily -Continue moderate SSI + CBGs ACHS -Optimize insulin regimen at discharge for better BG control, discuss with patient for education prior to d/c

## 2023-08-20 NOTE — Plan of Care (Signed)

## 2023-08-20 NOTE — Assessment & Plan Note (Addendum)
Chronic anemia suspected to be due to iron deficiency anemia.  Stable around 10 throughout admission. Iron 13 with ferritin 29 in August 2024.  -Continue home ferrous sulfate 325 daily

## 2023-08-20 NOTE — Assessment & Plan Note (Addendum)
Patient receiving diuresis throughout admission.  306 L UO in last 24 hours, with net 10.2 L neg negative since admission.  Down approx 10 kg since admission. Transitioned to PO torsemide on 11/19. Cr increased 1.16 > 1.43 this morning.  -Holding torsemide iso rising creatinine  -Continue home spironolactone 25 mg daily -Strict I&Os -Daily weights -AM BMP, Mg; replete as needed with goal K >4, Mg > 2

## 2023-08-21 DIAGNOSIS — J9601 Acute respiratory failure with hypoxia: Secondary | ICD-10-CM | POA: Diagnosis not present

## 2023-08-21 DIAGNOSIS — I509 Heart failure, unspecified: Secondary | ICD-10-CM | POA: Diagnosis not present

## 2023-08-21 LAB — GLUCOSE, CAPILLARY
Glucose-Capillary: 247 mg/dL — ABNORMAL HIGH (ref 70–99)
Glucose-Capillary: 255 mg/dL — ABNORMAL HIGH (ref 70–99)

## 2023-08-21 LAB — BASIC METABOLIC PANEL
Anion gap: 11 (ref 5–15)
BUN: 41 mg/dL — ABNORMAL HIGH (ref 6–20)
CO2: 35 mmol/L — ABNORMAL HIGH (ref 22–32)
Calcium: 8.5 mg/dL — ABNORMAL LOW (ref 8.9–10.3)
Chloride: 90 mmol/L — ABNORMAL LOW (ref 98–111)
Creatinine, Ser: 1.14 mg/dL — ABNORMAL HIGH (ref 0.44–1.00)
GFR, Estimated: 55 mL/min — ABNORMAL LOW (ref >60)
Glucose, Bld: 244 mg/dL — ABNORMAL HIGH (ref 70–99)
Potassium: 4.2 mmol/L (ref 3.5–5.1)
Sodium: 136 mmol/L (ref 135–145)

## 2023-08-21 LAB — MAGNESIUM: Magnesium: 2.8 mg/dL — ABNORMAL HIGH (ref 1.7–2.4)

## 2023-08-21 MED ORDER — INSULIN GLARGINE-YFGN 100 UNIT/ML ~~LOC~~ SOLN
30.0000 [IU] | Freq: Every day | SUBCUTANEOUS | Status: DC
  Filled 2023-08-21: qty 0.3

## 2023-08-21 MED ORDER — BASAGLAR KWIKPEN 100 UNIT/ML ~~LOC~~ SOPN: 30.0000 [IU] | PEN_INJECTOR | Freq: Every day | SUBCUTANEOUS | Status: DC

## 2023-08-21 NOTE — Discharge Summary (Addendum)
Family Medicine Teaching [facility] Discharge Summary  Patient name: Chelsea [name] Medical record number: [id number] Date of birth: [birth year] Age: 57 y.o. Gender: female Date of Admission: 08/15/2023  Date of Discharge: 08/21/23 Admitting Physician: [name], MD  Primary Care Provider: No PCP, lives at SNF Consultants: None  Indication for Hospitalization: Acute hypoxic respiratory failure  Discharge Diagnoses/Problem List:  Principal Problem for Admission: Acute hypoxic respiratory failure  Other Problems addressed during stay:  Principal Problem:   Acute hypoxic respiratory failure (HCC) Active Problems:   Acute exacerbation of chronic heart failure (HCC)   Type 2 diabetes mellitus (HCC)   Anemia   Hypercapnemia   Yeast infection   Elevated serum creatinine   Brief Hospital Course:  Chelsea [name] is a 57 y.o. year old with a history of HFpEF, COPD, T2DM, a flutter, and hypothyroidism who was admitted to the [facility] Medicine Teaching service for acute on chronic hypoxic and hypercarbic respiratory failure.  Acute on chronic hypoxic and hypercarbic respiratory failure Patient sent by SNF for hypoxia to the 70s and increased sleepiness. In the ED, patient with tachypnea, hypercapnia (96.6) and mild acidosis (pH 7.349). Initial workup with elevated BNP (243.8) and CXR with pulmonary edema and bilateral effusions. Patient placed on bipap with symptomatic improvement. 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 By time of discharge, patient was satting well on 3L [place] during the day with nightly bipap usage.   HFpEF exacerbation BNP elevated to 243 on arrival. CXR with pulmonary edema and bilateral pleural effusions.Recent echo (Aug 2024) with normal LVEF and diastolic function.  During admission, patient received IV diuretics and transitioned to PO diuretics by time of discharge. Patient down 10 kg with  net 11 L UOP from day of admission. Diuretics held briefly towards end of admission for AKI per below. Continued home torsemide 60 mg twice daily at discharge.  Type 2 diabetes mellitus A1c 9.3 during admission.  Continued home Jardiance 25 mg daily and given insulin glargine with SSI throughout admission. BG ranging in the 200-300s throughout admission. Discharged on 30 u long acting daily.   AKI Normal baseline creatinine. During admission, Cr rose to max 1.43. Held diuresis afterwards with Cr improvement to 1.14. Overall, AKI likely prerenal from diuresis.  Yeast infection Patient described some vaginal discharge, burning, and itching.  Has history of yeast infections.  Received Diflucan 150 mg once during admission with symptomatic improvement.  Other chronic conditions were medically managed with home medications and formulary alternatives as necessary (hypothyroidism, COPD, HLD, restless leg syndrome, GERD, chronic pain, GAD).  SNF/PCP Follow-up Recommendations: CT chest in next 2 weeks for resolution of previously identified nodular infiltrates  Sleep medicine follow-up to recalibrate BiPAP settings Held metolazone during admission. Reassess need.  Consider neurology follow up for twitching  Consider starting GLP-1   Disposition: Return to SNF  Discharge Condition: Improved and stable  Discharge Exam:  Vitals:   08/21/23 0330 08/21/23 0801  BP:  113/76  Pulse: 63 70  Resp: 15 17  Temp:  98.7 F (37.1 C)  SpO2: 95% 96%   General: No acute distress. Resting comfortably.  Cardiovascular: Harsh systolic murmur. No extra heart sounds. Warm and well-perfused.  Respiratory: Breathing comfortably on 3 L O2. Difficult to assess breath sounds due to body habitus, no crackles or wheezes appreciated. No increased WOB.  Abdomen: BS present. Soft. Nontender.  Extremities: Stable BLE edema, nontender.   Significant Procedures: n/a  Significant  Labs and Imaging:   Lab Results   Component Value Date   WBC 7.3 08/18/2023   HGB 10.1 (L) 08/18/2023   HCT 33.3 (L) 08/18/2023   MCV 93.0 08/18/2023   PLT 307 08/18/2023   Recent Labs  Lab 08/20/23 0435 08/21/23 0314  NA 136 136  K 3.9 4.2  CL 87* 90*  CO2 36* 35*  GLUCOSE 241* 244*  BUN 41* 41*  CREATININE 1.43* 1.14*  CALCIUM 8.7* 8.5*  MG 2.6* 2.8*   CBG (last 3)  Recent Labs    08/20/23 2111 08/21/23 0542 08/21/23 1130  GLUCAP 262* 247* 255*    CXR 11/14: congestive heart failure/pulmonary edema   Results/Tests Pending at Time of Discharge: n/a  Discharge Medications:  Allergies as of 08/21/2023       Reactions   Iodinated Contrast Media Anaphylaxis   Penicillins Anaphylaxis   Patient tolerates cefepime (08/2017)   Insulin Lispro Other (See Comments)   Adder per MAR from SNF   Minoxidil Hives        Medication List     STOP taking these medications    ALPRAZolam 0.25 MG tablet Commonly known as: XANAX   hydrOXYzine 25 MG tablet Commonly known as: ATARAX   insulin aspart 100 UNIT/ML injection Commonly known as: novoLOG   metolazone 2.5 MG tablet Commonly known as: ZAROXOLYN       TAKE these medications    albuterol (2.5 MG/3ML) 0.083% nebulizer solution Commonly known as: PROVENTIL Take 3 mLs (2.5 mg total) by nebulization every 6 (six) hours as needed for wheezing or shortness of breath (I50.32). What changed: Another medication with the same name was removed. Continue taking this medication, and follow the directions you see here.   amiodarone 200 MG tablet Commonly known as: PACERONE Take 1 tablet (200 mg total) by mouth daily.   ammonium lactate 12 % lotion Commonly known as: LAC-HYDRIN Apply 1 Application topically at bedtime.   atorvastatin 20 MG tablet Commonly known as: LIPITOR Take 20 mg by mouth every evening.   Basaglar KwikPen 100 UNIT/ML Inject 30 Units into the skin daily.   budesonide 0.5 MG/2ML nebulizer solution Commonly known as:  PULMICORT Inhale 0.5 mg into the lungs every 12 (twelve) hours.   buPROPion ER 100 MG 12 hr tablet Commonly known as: WELLBUTRIN SR Take 1 tablet (100 mg total) by mouth 2 (two) times daily.   Cholecalciferol 125 MCG (5000 UT) Tabs Take 1 tablet (5,000 Units total) by mouth daily.   Eliquis 5 MG Tabs tablet Generic drug: apixaban Take 5 mg by mouth 2 (two) times daily.   empagliflozin 25 MG Tabs tablet Commonly known as: JARDIANCE Take 1 tablet (25 mg total) by mouth daily. What changed:  medication strength how much to take   ferrous sulfate 325 (65 FE) MG EC tablet Take 325 mg by mouth daily.   gabapentin 400 MG capsule Commonly known as: NEURONTIN Take 400 mg by mouth 3 (three) times daily.   levothyroxine 150 MCG tablet Commonly known as: SYNTHROID Take 1 tablet (150 mcg total) by mouth daily at 6 (six) AM. What changed: when to take this   loperamide 2 MG capsule Commonly known as: IMODIUM Take 2 mg by mouth every 4 (four) hours as needed for diarrhea or loose stools.   melatonin 3 MG Tabs tablet Take 1 tablet (3 mg total) by mouth at bedtime.   montelukast 10 MG tablet Commonly known as: SINGULAIR Take 10 mg by mouth at bedtime.  multivitamin tablet Take 1 tablet by mouth daily.   NON FORMULARY Apply BIPAP every night at bedtime.   nystatin cream Commonly known as: MYCOSTATIN Apply topically 2 (two) times daily.   omeprazole 20 MG capsule Commonly known as: PRILOSEC Take 20 mg by mouth daily.   oxyCODONE-acetaminophen 10-325 MG tablet Commonly known as: PERCOCET Take 1 tablet by mouth every 6 (six) hours.   OXYGEN Inhale 5 L/min into the lungs in the morning and at bedtime. Connect to BIPAP qhs   potassium chloride SA 20 MEQ tablet Commonly known as: KLOR-CON M Take 2 tablets (40 mEq total) by mouth daily.   rOPINIRole 0.5 MG tablet Commonly known as: REQUIP Take 1 tablet (0.5 mg total) by mouth at bedtime.   spironolactone 25 MG  tablet Commonly known as: ALDACTONE Take 1 tablet (25 mg total) by mouth daily.   torsemide 20 MG tablet Commonly known as: DEMADEX Take 60 mg by mouth 2 (two) times daily.        Discharge Instructions: Please refer to Patient Instructions section of EMR for full details.  Patient was counseled important signs and symptoms that should prompt return to medical care, changes in medications, dietary instructions, activity restrictions, and follow up appointments.   Follow-Up Appointments:   Contact information for follow-up providers     Care, [facility] Follow up.   Specialty: Skilled Nursing Facility Contact information: [address] [place] [address] [phone]              Contact information for after-discharge care     Destination     [facility] Preferred SNF .   Service: Skilled Nursing Contact information: [address] [place] [address] [phone]                    [name], MD 08/21/2023, 12:32 PM PGY-1, [facility] Family Medicine   I have verified that the resident's  findings are accurately documented in the resident's note. I have made edits and changes where appropriate, and agree with plan.  Celine Mans, MD, PGY-2 [facility] Family Medicine 12:37 PM 08/21/2023

## 2023-08-21 NOTE — Progress Notes (Signed)
   08/20/23 2359  BiPAP/CPAP/SIPAP  $ Non-Invasive Ventilator  Non-Invasive Vent Subsequent  BiPAP/CPAP/SIPAP Pt Type Adult  BiPAP/CPAP/SIPAP V60  Reason BIPAP/CPAP not in use Other(comment) (pt states not ready to wear bipap at the moment but will call RT when ready)

## 2023-08-21 NOTE — Assessment & Plan Note (Deleted)
Patient's Cr elevated 1.16 (11/18) > 1.43 (11/19), improved to 1.14 (11/20). Baseline normal. Evening torsemide was held yesterday. Suspect AKI was secondary to significant diuresis.  - Holding torsemide  - AM BMP

## 2023-08-21 NOTE — Assessment & Plan Note (Signed)
Patient had symptoms consistent with recurrent yeast infection. Received one time dose of diflucan 150mg  on 11/17 with symptom improvement. Plan to follow up outpatient.

## 2023-08-21 NOTE — Assessment & Plan Note (Signed)
Patient breathing comfortably on 2.5 L LFNC, using BiPAP at night.   No concerns on telemetry overnight. Suspect baseline hypercarbia due to retention.  -Continue daytime O2 LF [place] with goal 88-92% -Continue BiPAP nightly -Cont home albuterol neb Q6 prn, pulmicort neb Q12, montelukast 10 nightly  -Continuous cardiac monitoring -Continuous pulse ox

## 2023-08-21 NOTE — Assessment & Plan Note (Signed)
Chronic anemia suspected to be due to iron deficiency anemia.  Stable around 10 throughout admission. Iron 13 with ferritin 29 in August 2024.  -Continue home ferrous sulfate 325 daily

## 2023-08-21 NOTE — Assessment & Plan Note (Signed)
Patient receiving diuresis throughout admission.  306 L UO in last 24 hours, with net 10.2 L neg negative since admission.  Down approx 10 kg since admission. Transitioned to PO torsemide on 11/19. Cr increased 1.16 > 1.43 this morning.  -Holding torsemide iso rising creatinine  -Continue home spironolactone 25 mg daily -Strict I&Os -Daily weights -AM BMP, Mg; replete as needed with goal K >4, Mg > 2

## 2023-08-21 NOTE — TOC Transition Note (Signed)
Transition of Care [facility]) - CM/SW Discharge Note   Patient Details  Name: MARCHELLE HUISH MRN: [id number] Date of Birth: 1966/08/19  Transition of Care [facility]) CM/SW Contact:  Michaela Corner, LCSWA Phone Number: 08/21/2023, 12:23 PM   Clinical Narrative:   Patient will DC to: [facility] Anticipated DC date: 08/21/2023 Family notified: Pt stated she will reach out to husband Transport by: Sharin Mons   Per MD patient ready for DC to [facility], room 203. RN to call report prior to discharge [phone]). RN, patient, pt stated she will reach out to husband, and facility notified of DC. Discharge Summary and FL2 sent to facility. DC packet on chart. Ambulance transport requested for patient.   CSW will sign off for now as social work intervention is no longer needed. Please consult [place] again if new needs arise.      Final next level of care: Other (comment) [facility] LTC) Barriers to Discharge: Barriers Resolved   Patient Goals and CMS Choice      Discharge Placement                Patient chooses bed at: [facility] Patient to be transferred to facility by: [facility] Name of family member notified: Pt stated she will call her partner Patient and family notified of of transfer: 08/19/23  Discharge Plan and Services Additional resources added to the After Visit Summary for   In-house Referral: Clinical Social Work                                   Social Determinants of Health (SDOH) Interventions SDOH Screenings   Food Insecurity: No Food Insecurity (08/15/2023)  Housing: Low Risk  (08/15/2023)  Recent Concern: Housing - Medium Risk (06/06/2023)  Transportation Needs: No Transportation Needs (08/15/2023)  Utilities: Not At Risk (08/15/2023)  Recent Concern: Utilities - At Risk (06/06/2023)  Alcohol Screen: Low Risk  (07/05/2022)  Financial Resource Strain: Low Risk  (07/05/2022)  Physical Activity: Insufficiently Active  (03/27/2021)   Received from [facility]  Social Connections: Unknown (02/04/2022)   Received from [facility]  Stress: No Stress Concern Present (09/15/2021)   Received from [facility]  Tobacco Use: Low Risk  (08/15/2023)     Readmission Risk Interventions    07/23/2022    2:19 PM 04/09/2022    4:17 PM  Readmission Risk Prevention Plan  Transportation Screening Complete Complete  PCP or Specialist Appt within 3-5 Days  Complete  HRI or Home Care Consult  Complete  Social Work Consult for Recovery Care Planning/Counseling  Complete  Palliative Care Screening  Not Applicable  Medication Review Oceanographer) Complete Referral to Pharmacy  PCP or Specialist appointment within 3-5 days of discharge Complete   HRI or Home Care Consult Complete   SW Recovery Care/Counseling Consult Complete   Palliative Care Screening Not Applicable   Skilled Nursing Facility Complete

## 2023-08-21 NOTE — Plan of Care (Signed)

## 2023-08-21 NOTE — Assessment & Plan Note (Signed)
Poorly controlled, with CBGs ranging 200-300 past 24 hours. Received 31 units SA and 15 units LA in the last 24 hours.  -Increase LA coverage to 25 units daily  -Continue Jardiance 25 mg daily -Continue moderate SSI + CBGs ACHS -Optimize insulin regimen at discharge for better BG control, discuss with patient for education prior to d/c

## 2023-08-21 NOTE — Progress Notes (Incomplete)
Daily Progress Note Intern Pager: [phone]  Patient name: Chelsea [name] Medical record number: [id number] Date of birth: 03/25/1966 Age: 57 y.o. Gender: female  Primary Care Provider: Pcp, No Consultants: None Code Status: Full   Pt Overview and Major Events to Date:  11/14: Admitted to progressive with FMTS   Assessment and Plan:   Chelsea [name] is a 57 y.o. female with history of HFpEF (60%), COPD, T2DM, A flutter, and hypothyroidism presenting with acute hypoxic respiratory failure likely secondary to OHS/OSA and heart failure exacerbation.  Assessment & Plan Acute hypoxic respiratory failure (HCC) Patient breathing comfortably on 2.5 L LFNC, using BiPAP at night.   No concerns on telemetry overnight. Suspect baseline hypercarbia due to retention.  -Continue daytime O2 LF [place] with goal 88-92% -Continue BiPAP nightly -Cont home albuterol neb Q6 prn, pulmicort neb Q12, montelukast 10 nightly  -Continuous cardiac monitoring -Continuous pulse ox Acute exacerbation of chronic heart failure (HCC) Patient receiving diuresis throughout admission.  306 L UO in last 24 hours, with net 10.2 L neg negative since admission.  Down approx 10 kg since admission. Transitioned to PO torsemide on 11/19. Cr increased 1.16 > 1.43 this morning.  -Holding torsemide iso rising creatinine  -Continue home spironolactone 25 mg daily -Strict I&Os -Daily weights -AM BMP, Mg; replete as needed with goal K >4, Mg > 2 Type 2 diabetes mellitus (HCC) Poorly controlled, with CBGs ranging 200-300 past 24 hours. Received 31 units SA and 15 units LA in the last 24 hours.  -Increase LA coverage to 25 units daily  -Continue Jardiance 25 mg daily -Continue moderate SSI + CBGs ACHS -Optimize insulin regimen at discharge for better BG control, discuss with patient for education prior to d/c Elevated serum creatinine Patient's Cr elevated 1.16 (11/18) > 1.43 (11/19), improved to 1.14 (11/20).  Baseline normal. Evening torsemide was held yesterday. Suspect AKI was secondary to significant diuresis.  - Holding torsemide  - AM BMP Anemia Chronic anemia suspected to be due to iron deficiency anemia.  Stable around 10 throughout admission. Iron 13 with ferritin 29 in August 2024.  -Continue home ferrous sulfate 325 daily Yeast infection Patient had symptoms consistent with recurrent yeast infection. Received one time dose of diflucan 150mg  on 11/17 with symptom improvement. Plan to follow up outpatient.   Chronic and Stable Issues: Hypothyroidism: Cont home levothyroxine 150 daily COPD: Cont home albuterol neb q6 prn, pulmicort neb q12, montelukast 10 nightly HLD: Cont home atorvastatin 20 nightly Restless leg: Cont home Requip 0.5 every other day GERD: Protonix 40 (formulary equivalent for home Prilosec 20) Chronic pain: Cont oxycodone 10 mg q6 PRN (home dose) Mood: Cont home wellbutrin 100 BID, Holding home Xanax PRN   FEN/GI: Carb modified diet PPx: Eliquis 5 mg BID (home dose) Dispo: SNF pending clinical stability   Subjective:  ***  Objective: Temp:  [97.6 F (36.4 C)-98.8 F (37.1 C)] 98.8 F (37.1 C) (11/20 0326) Pulse Rate:  [63-95] 63 (11/20 0330) Resp:  [15-20] 15 (11/20 0330) BP: (91-126)/(57-84) 100/63 (11/20 0326) SpO2:  [93 %-97 %] 95 % (11/20 0330) FiO2 (%):  [40 %] 40 % (11/20 0330) Weight:  [148.3 kg] 148.3 kg (11/20 0327) Physical Exam: General: *** Cardiovascular: *** Respiratory: *** Abdomen: *** Extremities: ***  Laboratory: Most recent CBC Lab Results  Component Value Date   WBC 7.3 08/18/2023   HGB 10.1 (L) 08/18/2023   HCT 33.3 (L) 08/18/2023   MCV 93.0 08/18/2023   PLT 307  08/18/2023   Most recent BMP    Latest Ref Rng & Units 08/21/2023    3:14 AM  BMP  Glucose 70 - 99 mg/dL 161   BUN 6 - 20 mg/dL 41   Creatinine 0.96 - 1.00 mg/dL 0.45   Sodium 409 - 811 mmol/L 136   Potassium 3.5 - 5.1 mmol/L 4.2   Chloride 98 - 111  mmol/L 90   CO2 22 - 32 mmol/L 35   Calcium 8.9 - 10.3 mg/dL 8.5     Chelsea Quale, MD 08/21/2023, 7:46 AM  PGY-1, [facility] FPTS Intern pager: [phone], text pages welcome Secure chat group [facility] Teaching Service

## 2023-09-19 ENCOUNTER — Emergency Department (HOSPITAL_COMMUNITY): Payer: Medicare Other

## 2023-09-19 ENCOUNTER — Encounter (HOSPITAL_COMMUNITY): Payer: Self-pay

## 2023-09-19 ENCOUNTER — Other Ambulatory Visit: Payer: Self-pay

## 2023-09-19 ENCOUNTER — Inpatient Hospital Stay (HOSPITAL_COMMUNITY)
Admission: EM | Admit: 2023-09-19 | Discharge: 2023-09-24 | DRG: 189 | Disposition: A | Discharge status: Skilled Nursing Facility | Payer: Medicare Other | Source: Skilled Nursing Facility | Attending: Family Medicine | Admitting: Family Medicine

## 2023-09-19 DIAGNOSIS — E662 Morbid (severe) obesity with alveolar hypoventilation: Secondary | ICD-10-CM | POA: Diagnosis present

## 2023-09-19 DIAGNOSIS — Z794 Long term (current) use of insulin: Secondary | ICD-10-CM | POA: Diagnosis not present

## 2023-09-19 DIAGNOSIS — I4892 Unspecified atrial flutter: Secondary | ICD-10-CM | POA: Diagnosis present

## 2023-09-19 DIAGNOSIS — L03115 Cellulitis of right lower limb: Secondary | ICD-10-CM | POA: Diagnosis present

## 2023-09-19 DIAGNOSIS — Z8249 Family history of ischemic heart disease and other diseases of the circulatory system: Secondary | ICD-10-CM

## 2023-09-19 DIAGNOSIS — J441 Chronic obstructive pulmonary disease with (acute) exacerbation: Secondary | ICD-10-CM | POA: Diagnosis present

## 2023-09-19 DIAGNOSIS — Z993 Dependence on wheelchair: Secondary | ICD-10-CM

## 2023-09-19 DIAGNOSIS — M10042 Idiopathic gout, left hand: Secondary | ICD-10-CM | POA: Diagnosis present

## 2023-09-19 DIAGNOSIS — J189 Pneumonia, unspecified organism: Secondary | ICD-10-CM | POA: Diagnosis present

## 2023-09-19 DIAGNOSIS — R918 Other nonspecific abnormal finding of lung field: Secondary | ICD-10-CM | POA: Insufficient documentation

## 2023-09-19 DIAGNOSIS — J440 Chronic obstructive pulmonary disease with acute lower respiratory infection: Secondary | ICD-10-CM | POA: Diagnosis present

## 2023-09-19 DIAGNOSIS — Z79899 Other long term (current) drug therapy: Secondary | ICD-10-CM

## 2023-09-19 DIAGNOSIS — R339 Retention of urine, unspecified: Secondary | ICD-10-CM | POA: Diagnosis present

## 2023-09-19 DIAGNOSIS — L03116 Cellulitis of left lower limb: Secondary | ICD-10-CM | POA: Diagnosis present

## 2023-09-19 DIAGNOSIS — J9612 Chronic respiratory failure with hypercapnia: Secondary | ICD-10-CM | POA: Diagnosis present

## 2023-09-19 DIAGNOSIS — J449 Chronic obstructive pulmonary disease, unspecified: Secondary | ICD-10-CM | POA: Diagnosis not present

## 2023-09-19 DIAGNOSIS — Z888 Allergy status to other drugs, medicaments and biological substances status: Secondary | ICD-10-CM

## 2023-09-19 DIAGNOSIS — I5033 Acute on chronic diastolic (congestive) heart failure: Secondary | ICD-10-CM | POA: Diagnosis present

## 2023-09-19 DIAGNOSIS — Z90710 Acquired absence of both cervix and uterus: Secondary | ICD-10-CM

## 2023-09-19 DIAGNOSIS — Z7984 Long term (current) use of oral hypoglycemic drugs: Secondary | ICD-10-CM

## 2023-09-19 DIAGNOSIS — K219 Gastro-esophageal reflux disease without esophagitis: Secondary | ICD-10-CM | POA: Diagnosis present

## 2023-09-19 DIAGNOSIS — E785 Hyperlipidemia, unspecified: Secondary | ICD-10-CM | POA: Diagnosis present

## 2023-09-19 DIAGNOSIS — J9601 Acute respiratory failure with hypoxia: Principal | ICD-10-CM | POA: Diagnosis present

## 2023-09-19 DIAGNOSIS — I872 Venous insufficiency (chronic) (peripheral): Secondary | ICD-10-CM | POA: Diagnosis present

## 2023-09-19 DIAGNOSIS — Z880 Allergy status to penicillin: Secondary | ICD-10-CM

## 2023-09-19 DIAGNOSIS — Z22322 Carrier or suspected carrier of Methicillin resistant Staphylococcus aureus: Secondary | ICD-10-CM

## 2023-09-19 DIAGNOSIS — J9622 Acute and chronic respiratory failure with hypercapnia: Secondary | ICD-10-CM | POA: Diagnosis not present

## 2023-09-19 DIAGNOSIS — E1140 Type 2 diabetes mellitus with diabetic neuropathy, unspecified: Secondary | ICD-10-CM

## 2023-09-19 DIAGNOSIS — I890 Lymphedema, not elsewhere classified: Secondary | ICD-10-CM | POA: Diagnosis present

## 2023-09-19 DIAGNOSIS — I480 Paroxysmal atrial fibrillation: Secondary | ICD-10-CM | POA: Diagnosis present

## 2023-09-19 DIAGNOSIS — Z9981 Dependence on supplemental oxygen: Secondary | ICD-10-CM

## 2023-09-19 DIAGNOSIS — Z7901 Long term (current) use of anticoagulants: Secondary | ICD-10-CM

## 2023-09-19 DIAGNOSIS — D509 Iron deficiency anemia, unspecified: Secondary | ICD-10-CM | POA: Diagnosis present

## 2023-09-19 DIAGNOSIS — G2581 Restless legs syndrome: Secondary | ICD-10-CM | POA: Diagnosis present

## 2023-09-19 DIAGNOSIS — E039 Hypothyroidism, unspecified: Secondary | ICD-10-CM | POA: Diagnosis present

## 2023-09-19 DIAGNOSIS — Z7951 Long term (current) use of inhaled steroids: Secondary | ICD-10-CM

## 2023-09-19 DIAGNOSIS — R079 Chest pain, unspecified: Secondary | ICD-10-CM

## 2023-09-19 DIAGNOSIS — Z9049 Acquired absence of other specified parts of digestive tract: Secondary | ICD-10-CM

## 2023-09-19 DIAGNOSIS — E119 Type 2 diabetes mellitus without complications: Secondary | ICD-10-CM | POA: Diagnosis present

## 2023-09-19 DIAGNOSIS — J9621 Acute and chronic respiratory failure with hypoxia: Secondary | ICD-10-CM | POA: Diagnosis present

## 2023-09-19 DIAGNOSIS — Z6841 Body Mass Index (BMI) 40.0 and over, adult: Secondary | ICD-10-CM | POA: Diagnosis not present

## 2023-09-19 DIAGNOSIS — I878 Other specified disorders of veins: Secondary | ICD-10-CM | POA: Diagnosis present

## 2023-09-19 DIAGNOSIS — G8929 Other chronic pain: Secondary | ICD-10-CM | POA: Diagnosis present

## 2023-09-19 DIAGNOSIS — I5030 Unspecified diastolic (congestive) heart failure: Secondary | ICD-10-CM | POA: Diagnosis present

## 2023-09-19 DIAGNOSIS — Z8541 Personal history of malignant neoplasm of cervix uteri: Secondary | ICD-10-CM

## 2023-09-19 DIAGNOSIS — G9341 Metabolic encephalopathy: Secondary | ICD-10-CM | POA: Diagnosis not present

## 2023-09-19 DIAGNOSIS — I2510 Atherosclerotic heart disease of native coronary artery without angina pectoris: Secondary | ICD-10-CM | POA: Diagnosis present

## 2023-09-19 DIAGNOSIS — Z91041 Radiographic dye allergy status: Secondary | ICD-10-CM

## 2023-09-19 DIAGNOSIS — D649 Anemia, unspecified: Secondary | ICD-10-CM | POA: Diagnosis present

## 2023-09-19 DIAGNOSIS — Z87898 Personal history of other specified conditions: Secondary | ICD-10-CM | POA: Insufficient documentation

## 2023-09-19 DIAGNOSIS — I110 Hypertensive heart disease with heart failure: Secondary | ICD-10-CM | POA: Diagnosis present

## 2023-09-19 DIAGNOSIS — Z79891 Long term (current) use of opiate analgesic: Secondary | ICD-10-CM

## 2023-09-19 DIAGNOSIS — Z79890 Hormone replacement therapy (postmenopausal): Secondary | ICD-10-CM

## 2023-09-19 HISTORY — DX: Chronic respiratory failure with hypercapnia: J96.12

## 2023-09-19 LAB — COMPREHENSIVE METABOLIC PANEL
ALT: 11 U/L (ref 0–44)
AST: 13 U/L — ABNORMAL LOW (ref 15–41)
Albumin: 2.8 g/dL — ABNORMAL LOW (ref 3.5–5.0)
Alkaline Phosphatase: 106 U/L (ref 38–126)
Anion gap: 13 (ref 5–15)
BUN: 23 mg/dL — ABNORMAL HIGH (ref 6–20)
CO2: 38 mmol/L — ABNORMAL HIGH (ref 22–32)
Calcium: 8.5 mg/dL — ABNORMAL LOW (ref 8.9–10.3)
Chloride: 90 mmol/L — ABNORMAL LOW (ref 98–111)
Creatinine, Ser: 1.08 mg/dL — ABNORMAL HIGH (ref 0.44–1.00)
GFR, Estimated: 59 mL/min — ABNORMAL LOW (ref >60)
Glucose, Bld: 259 mg/dL — ABNORMAL HIGH (ref 70–99)
Potassium: 4 mmol/L (ref 3.5–5.1)
Sodium: 141 mmol/L (ref 135–145)
Total Bilirubin: 0.6 mg/dL (ref <1.2)
Total Protein: 7 g/dL (ref 6.5–8.1)

## 2023-09-19 LAB — CBC WITH DIFFERENTIAL/PLATELET
Abs Immature Granulocytes: 0.05 10*3/uL (ref 0.00–0.07)
Basophils Absolute: 0 10*3/uL (ref 0.0–0.1)
Basophils Relative: 0 %
Eosinophils Absolute: 0.1 10*3/uL (ref 0.0–0.5)
Eosinophils Relative: 1 %
HCT: 37.8 % (ref 36.0–46.0)
Hemoglobin: 11 g/dL — ABNORMAL LOW (ref 12.0–15.0)
Immature Granulocytes: 1 %
Lymphocytes Relative: 14 %
Lymphs Abs: 1.3 10*3/uL (ref 0.7–4.0)
MCH: 27.2 pg (ref 26.0–34.0)
MCHC: 29.1 g/dL — ABNORMAL LOW (ref 30.0–36.0)
MCV: 93.3 fL (ref 80.0–100.0)
Monocytes Absolute: 0.9 10*3/uL (ref 0.1–1.0)
Monocytes Relative: 9 %
Neutro Abs: 7.5 10*3/uL (ref 1.7–7.7)
Neutrophils Relative %: 75 %
Platelets: 236 10*3/uL (ref 150–400)
RBC: 4.05 MIL/uL (ref 3.87–5.11)
RDW: 15.1 % (ref 11.5–15.5)
WBC: 9.9 10*3/uL (ref 4.0–10.5)
nRBC: 0 % (ref 0.0–0.2)

## 2023-09-19 LAB — PROTIME-INR
INR: 1.5 — ABNORMAL HIGH (ref 0.8–1.2)
Prothrombin Time: 18.3 s — ABNORMAL HIGH (ref 11.4–15.2)

## 2023-09-19 LAB — I-STAT CHEM 8, ED
BUN: 32 mg/dL — ABNORMAL HIGH (ref 6–20)
Calcium, Ion: 0.97 mmol/L — ABNORMAL LOW (ref 1.15–1.40)
Chloride: 89 mmol/L — ABNORMAL LOW (ref 98–111)
Creatinine, Ser: 1.1 mg/dL — ABNORMAL HIGH (ref 0.44–1.00)
Glucose, Bld: 258 mg/dL — ABNORMAL HIGH (ref 70–99)
HCT: 36 % (ref 36.0–46.0)
Hemoglobin: 12.2 g/dL (ref 12.0–15.0)
Potassium: 4 mmol/L (ref 3.5–5.1)
Sodium: 138 mmol/L (ref 135–145)
TCO2: 45 mmol/L — ABNORMAL HIGH (ref 22–32)

## 2023-09-19 LAB — I-STAT CG4 LACTIC ACID, ED: Lactic Acid, Venous: 1.4 mmol/L (ref 0.5–1.9)

## 2023-09-19 LAB — APTT: aPTT: 38 s — ABNORMAL HIGH (ref 24–36)

## 2023-09-19 LAB — BRAIN NATRIURETIC PEPTIDE: B Natriuretic Peptide: 236.6 pg/mL — ABNORMAL HIGH (ref 0.0–100.0)

## 2023-09-19 MED ORDER — SODIUM CHLORIDE 0.9 % IV SOLN
1500.0000 mg | Freq: Once | INTRAVENOUS | Status: AC
  Administered 2023-09-19: 1500 mg via INTRAVENOUS
  Filled 2023-09-19: qty 30

## 2023-09-19 MED ORDER — SODIUM CHLORIDE 0.9 % IV SOLN: 2.0000 g | Freq: Once | INTRAVENOUS | Status: DC

## 2023-09-19 MED ORDER — VANCOMYCIN HCL IN DEXTROSE 1-5 GM/200ML-% IV SOLN
1000.0000 mg | Freq: Once | INTRAVENOUS | Status: AC
  Administered 2023-09-19: 1000 mg via INTRAVENOUS
  Filled 2023-09-19: qty 200

## 2023-09-19 MED ORDER — SODIUM CHLORIDE 0.9 % IV BOLUS (SEPSIS)
1000.0000 mL | Freq: Once | INTRAVENOUS | Status: AC
  Administered 2023-09-19: 1000 mL via INTRAVENOUS

## 2023-09-19 MED ORDER — LEVOFLOXACIN IN D5W 750 MG/150ML IV SOLN
750.0000 mg | Freq: Once | INTRAVENOUS | Status: DC
  Filled 2023-09-19: qty 150

## 2023-09-19 MED ORDER — SODIUM CHLORIDE 0.9 % IV SOLN
2.0000 g | Freq: Once | INTRAVENOUS | Status: AC
  Administered 2023-09-19: 2 g via INTRAVENOUS
  Filled 2023-09-19: qty 12.5

## 2023-09-19 NOTE — Progress Notes (Signed)
Pt. Transported from room 29 to CT1 and back on 100% O2 with RN ,Tech and RT without any complications. VS stable at this time

## 2023-09-19 NOTE — H&P (Incomplete)
Hospital Admission History and Physical Service Pager: [phone]  Patient name: Chelsea [name] Medical record number: [id number] Date of Birth: 06-19-1966 Age: 57 y.o. Gender: female  Primary Care Provider: Pcp, No Consultants: *** Code Status: FULL, which was confirmed with *** at bedside Preferred Emergency Contact: *** Contact Information     Name Relation Home Work [place] Significant other   [phone]      Other Contacts     Name Relation Home Work [place] Sister   [phone]       Chief Complaint: ***  Assessment and Plan: LADAIJA VACCARELLI is a 57 y.o. female presenting with ***.  Differential for this patient's presentation includes ***.  OHS/OSA: Most likely a component given hypercapnia and acidosis iso days without Bipap usage. Hypercapnic to 96.6.  HF exacerbation: Most likely a component given fluid overload on exam, edema/effusion on CXR, and elevated BNP (243.8). COPD exacerbation: Likely a component given increased coughing and sputum change.  Infection: Afebrile without leukocytosis.  DKA: Hyperglycemic to 300s on arrival. No recent insulin use reported. Mild acidosis of pH 7.349 with small AG of 14.  PE: Less likely. Wells score 1.5. No tachycardia or chest pain. ACS: Not likely given EKG without ischemic changes and no chest pain.  -Admit to FMTS, attending Dr. Deirdre Priest, Progressive unit  ***Borderline prolonged QT with QTcB 508. *** Conside V/Q scan in AM given contrast allergy contraindicating CTA chest Assessment & Plan Acute hypoxic on chronic hypercapnic respiratory failure (HCC)   Patient with reported hypoxia to the 70s at SNF, found to be tachypneic, mildly acidotic (7.349), hypercapnic (96.6) in ED. Patient has not used her Bipap for past few nights. Initial workup included elevated BNP (243.8) with fluid overload noted on CXR and exam. Most likely mixed etiology including OHS/OSA and  HF exacerbation. Possible component of COPD exacerbation as well given cough and sputum change. Low suspicion for infection, PE, ACS at this time per ddx.  - Admit to Progressive with FMTS attending Dr Lum Babe - Continue Bipap. VBG ordered to reassess pH and CO2 following further bipap use. - Once stable, restart home 8L O2 [place] during day with Bipap nightly  - Cont home albuterol neb q6 prn, pulmicort neb q12, montelukast 10 nightly  - Continuous cardiac telemetry  - Continuous pulse ox - Neuro checks q4 - Vital signs per floor protocol - Monitor fever curve. Consider abx as needed for possible infection.  - Consider CTPE for worsening symptoms - AM CBC, BMP, Mg - replete electrolytes as needed for K >4, Mg > 2  Patient with chronic HF found to be fluid overloaded on exam and initial workup. BNP elevated to 243.8. CXR with pulmonary edema and bilateral pleural effusions. S/p 40 IV lasix in ED. Home diuretics include torsemide 40 BID, spironolactone 25 daily, metolazone 2.5 every other day.  - Additional 40 IV lasix ordered - Continue home spironolactone 25 daily - Holding home torsemide, metolazone - restart as needed/tolerated  - Strict I/Os - Daily weights  - AM BMP, Mg - replete electrolytes as needed for K >4, Mg > 2 - Consider HF consults, given previous admissions   Poorly controlled. Hyperglycemic to 300s on arrival. Last A1c of 8.5 in Aug 2024. Home regimen includes Jardiance daily. Previously prescribed insulin, but patient has not been using.  - continue home Jardiance 10 daily  - mSSI + CBGs AC & at bedtime - AM A1c   Chronic  anemia with iron deficiency, with home iron supplementation. Hgb 9.2 on arrival. Unclear baseline, possibly around 10-11. Iron 13 with ferritin 29 in Aug 2024. Patient's anemia may be contributing somewhat to SOB.  - Cont home ferrous sulfate 325 daily once off of bipap  - AM CBC  Chronic and stable: Hypothyroidism: Cont home levothyroxine 150 daily   COPD: Cont home albuterol neb Q6 PRN, pulmicort neb Q12, montelukast 10 QHS, O2 goal 88-92% HLD: Cont home atorvastatin 20 QHS RLS: Cont home Requip 0.5 every other day GERD: Pantoprazole 40 mg (formulary equivalent for home omeprazole 20 mg) Chronic pain: Holding home percocet given acute respiratory failure on BiPAP*** Mood: Holding home Xanax prn given acute respiratory failure on BiPAP***  FEN/GI: NPO on BiPAP, then transition to heart healthy/carb modified diet VTE Prophylaxis: Home Eliquis 5 mg BID  Disposition: Progressive floor  History of Present Illness: Chelsea [name] is a 57 y.o. female with a pertinent PMH of chronic hypercapnia/hypoxic respiratory failure, COPD on *** L O2, OSA/OHS on BiPAP, HFpEF, T2DM, A-flutter on Eliquis, HTN, HLD, and hypothyroidism presenting with respiratory failure requiring BiPAP.  Of note, patient was admitted to FMTS on 11/14 for acute on chronic hypoxic and hypercarbic respiratory failure and discharged on 11/20.  During that admission, she was treated for chronic hypercarbia with nightly BiPAP and for HFpEF exacerbation with IV diuresis.  Was discharged on torsemide 60 mg BID and home 3 L supplemental O2.  In the ED, patient was somewhat altered and O2 dropped to 70s on nonrebreather.  Was placed on BiPAP with concern for hypercapnia.  Mental status improved and refused VBG.  BNP was elevated to 237, CMP showed CO2 of 38.  CXR showed patchy airspace disease in R mid to lower lung fields concerning for atelectasis versus infiltrate/pulmonary edema.  Received NS 1 L bolus.  Noted significant BLE erythema in setting of chronic lymphedema concerning for cellulitis.  Blood cultures x2 were drawn and patient started on cefepime and vancomycin.  Lactate WNL.  CT chest pending.  [facility] initially consulted for admission and admitted patient, but noted this was FMTS bounce back and care transferred over to FMTS.  On FMTS evaluation, patient  *** Reports she has been using her BiPAP at night regularly Reports using 5L O2 at baseline Endorses pain in lower extremities which is not new Uses nebulizer twice daily Denies recent confusion or drowsiness Endorses midline substernal chest pain x a few hours. Feels as if someone is sitting on her chest Endorses nonproductive cough  Review Of Systems: Per HPI with the following additions: ***  Pertinent Past Medical History: Chronic hypercapnia, hypoxic respiratory failure on *** L [place] OSA/OHS with nocturnal hypoxemia in BiPAP*** COPD HFpEF (60-65% Aug 2024)  A-flutter on Eliquis T2DM HTN HLD Hypothyroidism  Restless leg syndrome IDA Cervical Cancer s/p hysterectomy 2006 with revision for infection  Remainder reviewed in history tab.   Pertinent Past Surgical History: Tracheostomy (2023) Cholecystectomy (?) Hysterectomy (2006)  Remainder reviewed in history tab.  Pertinent Social History: Tobacco use: Never smoked Alcohol use: None Other Substance use: None Lives at [facility] *** facility  Pertinent Family History: Heart disease in father  Remainder reviewed in history tab.   Important Outpatient Medications: Amiodarone 200 mg daily  Albuterol neb PRN - Using twice daily Alprazolam 0.25 q8 prn - STOPPED last admission*** Atorvastatin 20 mg daily -  Budesonide nebulizer 0.5 mg q12h - Using twice daily Bupropion ER 100 mg BID  Cholecalciferol 125 mcg (  5,000 units) daily -  Eliquis 5 mg BID  Empaglifozin 25 mg daily -  Ferrous sulfate 325 mg daily -  Gabapentin 400 mg TID -  Insulin glargine 30 units daily Levothyroxine 150 mcg daily -  Loperamide 2 mg Q4h PRN - not taking*** Metolazone 2.5 every other day - HELD last admission Montelukast 10 mg daily -  Omeprazole 20 mg daily -  Percocet 10-325 Q6h - Taking 4 times per day Potassium 40 mEq daily -  Ropinirole 0.5 QHS -  Spironolactone 25 mg daily -  Torsemide 60 mg BID -  Oxygen 5 L/min -  Using constantly BiPAP QHS - Compliant nightly  Remainder reviewed in medication history.   Objective: BP 122/72   Pulse 86   Temp 98.8 F (37.1 C) (Oral)   Resp (!) 25   SpO2 98%   Physical Exam: General: *** Eyes: *** ENTM: *** Neck: *** Cardiovascular: *** Respiratory: ***on room air Gastrointestinal: *** MSK: *** Extremities: *** Derm: *** Neuro: *** Psych: ***  Labs:  CBC BMET  Recent Labs  Lab 09/19/23 1753 09/19/23 1809  WBC 9.9  --   HGB 11.0* 12.2  HCT 37.8 36.0  PLT 236  --    Recent Labs  Lab 09/19/23 1753 09/19/23 1809  NA 141 138  K 4.0 4.0  CL 90* 89*  CO2 38*  --   BUN 23* 32*  CREATININE 1.08* 1.10*  GLUCOSE 259* 258*  CALCIUM 8.5*  --      Pertinent additional labs: -Lactate 1.4 -BNP 237 -PT/INR 18.3/1.5 -UA pending -Blood Cx pending  Own interpretation of EKG: NSR.  No evidence of ischemia.  QTcB 508.  Largely unchanged from 08/15/2023.   Imaging Studies: -CXR: Infiltrate versus atelectasis in LLL, RML, RLL.  No CVA blunting, no effusion.  Similar to 11/14 with notably less pulmonary edema. -CT chest w/o: Pending  Independently reviewed and agree with radiologist's interpretation.  Ommie Degeorge Sharion Dove, MD 09/19/2023, 10:42 PM  PGY-1, [facility] FPTS Intern pager: [phone], text pages welcome Secure chat group [facility] Teaching Service

## 2023-09-19 NOTE — Progress Notes (Signed)
Elink is following this code sepsis ?

## 2023-09-19 NOTE — H&P (Addendum)
Hospital Admission History and Physical Service Pager: [phone]  Patient name: Chelsea [name] Medical record number: [id number] Date of Birth: 03-26-1966 Age: 57 y.o. Gender: female  Primary Care Provider: Pcp, No Consultants: None Code Status: FULL, which was confirmed with patient at bedside Preferred Emergency Contact: Contact Information     Name Relation Home Work [employer] [place] [place] Significant other   [phone]      Other Contacts     Name Relation Home Work [place] Sister   [phone]      Chief Complaint: Hypoxia  Assessment and Plan: Chelsea [name] is a 57 y.o. female presenting with hypoxic respiratory failure.  Differential for this patient's presentation includes CAP, COPD exacerbation, CHF exacerbation, ACS, pulmonary embolism, sepsis, and BiPAP nonadherence.  There is always a chronic component of hypercapnic respiratory failure 2/2 undertreated OSA/OHS underlying her acute deterioration.  At baseline, she likely has minimal respiratory reserve.  However, she states she is compliant with nightly BiPAP.  The patient has had a number of admissions in the past 12 months and oscillates between COPD exacerbation, CHF exacerbation, and now possible CAP as the triggers for acute on chronic respiratory failure.  Concerning the differential for her acute presentation, CAP is one of the leading etiologies, though CHF exacerbation is a consideration as well.  Imaging findings are concerning for possible pneumonia, though could also be due to atelectasis or pulmonary edema and patient remains afebrile and without leukocytosis or lactic acidosis.  Difficult to assess patient's volume status given body habitus with chronic LE edema.  BNP is elevated similarly to prior admission when she required extensive diuresis and BNP is often underestimated in obese patients, will give one dose Lasix IV and reassess in AM.  Lower concern for COPD  exacerbation given nonproductive cough and no wheezing on exam.  Less likely, but also considered etiologies include: ACS ruled out with normal troponin and EKG.  PE possible with some pleuritic chest pain, but not tachycardic and compliant with Eliquis at home; Wells score of 1.5.  Patient unfortunately has anaphylaxis allergy to IV, can consider V/Q scan in AM if concerned.  Sepsis treatment and workup started in ED, but lactate WNL, no leukocytosis, and afebrile with stable BP and HR; qSOFA of 1 is not high risk.  Reasonably sure that patient is compliant with her medications and BiPAP given that she has been residing at [facility]. Assessment & Plan Acute hypoxic on chronic hypercapnic respiratory failure (HCC) Somnolent, but able to follow instructions, converse, and respond to questions appropriately while on BiPAP.  VBG showing pCO2 88 with bicarb 47.5, similar to prior hospitalization.  Etiology likely multifactorial with minimal respiratory reserve at baseline d/t underlying OSA/OHS, but considering CHF exacerbation versus PNA. -Admit to FMTS, attending Dr. Deirdre Priest, Progressive unit -Continuous pulse ox -NPO and hold PO meds until somnolence improves and off BiPAP -Continue BiPAP, wean to oxygen with O2 goal 88-92%, will need nightly BiPAP moving forward -Consider repeat VBG in AM to determine if BiPAP can be discontinued, will also observe mental status (HFpEF) heart failure with preserved ejection fraction (HCC) EF 60-65% in August 2024.  Difficult to evaluate volume status, but some concern for pulmonary edema on imaging and with elevated BNP.  Will diurese and follow output to determine dosing. Home regimen: Torsemide 60 mg BID, spironolactone 25 mg daily, empagliflozin 25 mg daily -Continue spironolactone 25 mg daily, empagliflozin 25 mg daily -Hold oral torsemide and dose Lasix 40  mg IV now, redose in AM -Strict I&Os, daily weights -Consider HF consult given  pattern of previous admissions -Continuous cardiac monitoring Chest pain Likely pleuritic, may be related to PNA.  Tropoin WNL and EKG showing NSR without ischemic changes, no concern for ACS.  Considered PE, though less likely given compliance with Eliquis BID.  No tachycardia and Wells score 1.5. -Consider V/Q scan in AM given contrast allergy contraindicating CTA chest -Continuous cardiac monitoring Atrial flutter (HCC) Currently in NSR.  Stable issue, no concern for contribution to heart failure or chest pain.  Ordering 10 mEq KCl IV x4. -Continue Eliquis 5 mg BID -Continue amiodarone 200 mg daily for rhythm control -AM BMP, Mag; goal K>4, Mag>2 CAP (community acquired pneumonia) CT chest with bilateral geographic groundglass and consolidative airspace opacity, greatest in lower lobes concerning for multifocal pneumonia though pulmonary edema could appear similarly.  No leukocytosis, afebrile, lactate WNL.  May have precipitated acute respiratory failure, will treat empirically for CAP.  S/p cefepime and vancomycin in ED yesterday, discussed antibiotics choice and timing with pharmacy overnight. -CTX 1 g daily (day 2 of 5), azithromycin 500 mg daily (day 1 of 3) -Will need follow up imaging for resolution after treatment -Monitor fever curve -Follow blood cultures Cellulitis of both lower extremities BLE with chronic edema and now new rubor and calor but afebrile and without leukocytosis.  Concerned for cellulitis or impetigo with possibility of MRSA and will cover with vancomycin.  S/p vancomycin bolus load in ED, discussed antibiotics choice and timing with pharmacy overnight. -Vancomycin IV per pharmacy dosing for beta-hemolytic strep and MRSA coverage -Consider switching to daptomycin when tolerating PO Abx -Consider Wound Care consult if concerned for progression or weeping, skin surface disturbance -AM CBC COPD (chronic obstructive pulmonary disease) (HCC) Chronic issue.  Do not  favor acute exacerbation given that cough is nonproductive.  Minimal wheezing in lungs. -Continue home budesonide nebulizer BID and montelukast 10 mg daily -DuoNebs Q4h PRN for wheezing -O2 goal 88-92% T2DM (type 2 diabetes mellitus) (HCC) A1c 9.3 on 11/15 and poorly controlled. Home regimen: Trulicity 0.75 mg weekly, glargine 30 units daily, empagliflozin 25 mg daily -Continue empagliflozin 25 mg daily -CBGs ACHS -mSSI -Adjust LAI based on CBGs when tolerating PO and off BiPAP Urinary retention Pharmacy tech noted oxybutynin 2.5 mg BID on med rec. -Hold oxybutynin for now given centrally acting off target effects in setting of somnolence -Bladder scans Q8h, I&O cath >300 mL Chronic anemia Stable relative to baseline.  Likely IDA with ferritin 29 in August 2024. -Continue ferrous sulfate 325 mg daily -AM CBC  Chronic and stable: Hypothyroidism: Cont home levothyroxine 150 mcg daily  HLD: Cont home atorvastatin 20 mg QHS RLS: Cont home ropinirole 0.5 every other day, ferrous sulfate 325 mg daily GERD: Continue pantoprazole 40 mg daily (formulary equivalent for home omeprazole 20 mg) Chronic pain: Hold home Percocet 10-325 Q6h PRN and gabapentin 400 mg TID given acute respiratory failure on BiPAP  FEN/GI: NPO on BiPAP, then transition to heart healthy/carb modified diet VTE Prophylaxis: Home Eliquis 5 mg BID  Disposition: Progressive floor  History of Present Illness: Chelsea [name] is a 57 y.o. female with a pertinent PMH of chronic hypercapnia/hypoxic respiratory failure, COPD on 5 L O2, OSA/OHS on BiPAP, HFpEF, T2DM, A-flutter on Eliquis, HTN, HLD, and hypothyroidism presenting with respiratory failure requiring BiPAP.  Of note, patient was admitted to FMTS on 11/14 for acute on chronic hypoxic and hypercarbic respiratory failure and discharged on 11/20.  During that admission, she was treated for chronic hypercarbia with nightly BiPAP and for HFpEF exacerbation with IV  diuresis.  Was discharged on torsemide 60 mg BID and home 3 L supplemental O2.  In the ED, patient was somewhat altered and O2 dropped to 70s on nonrebreather.  Was placed on BiPAP with concern for hypercapnia.  Mental status improved and refused VBG.  BNP was elevated to 237, CMP showed CO2 of 38.  CXR showed patchy airspace disease in R mid to lower lung fields concerning for atelectasis versus infiltrate/pulmonary edema.  Received NS 1 L bolus.  Noted significant BLE erythema in setting of chronic lymphedema concerning for cellulitis.  Initially, ED was concerned for sepsis; blood cultures x2 were drawn and patient started on cefepime and vancomycin.  Lactate WNL and sepsis became less likely.  CT chest pending.  [facility] initially consulted for admission and admitted patient, but noted this was FMTS bounce back and care transferred over to FMTS.  On FMTS evaluation, patient is somnolent and on BiPAP, limiting ability to converse with her.  However, she is able to respond to questions with brief phrases and nods.  She denies any recent health issues and states she was feeling fine.  Reports she has been using her BiPAP at night regularly.  Reports using 5L O2 at baseline and nebulizer twice daily.  Endorses pain in lower extremities which is not new.  Denies recent confusion or drowsiness.  Endorses midline substernal chest pain for the past few hours.  Feels as if someone is sitting on her chest.  Endorses nonproductive cough.  Review Of Systems: Per HPI.  Pertinent Past Medical History: Chronic hypercapnia, hypoxic respiratory failure on 5 L [place] OSA/OHS with nocturnal hypoxemia on BiPAP COPD HFpEF (60-65% Aug 2024)  A-flutter on Eliquis T2DM HTN HLD Hypothyroidism  Restless leg syndrome IDA Cervical Cancer s/p hysterectomy 2006 with revision for infection  Remainder reviewed in history tab.   Pertinent Past Surgical History: Tracheostomy (2023) Cholecystectomy  (?) Hysterectomy (2006)  Remainder reviewed in history tab.  Pertinent Social History: Tobacco use: Never smoked Alcohol use: None Other Substance use: None Lives at [facility] facility  Pertinent Family History: Heart disease in father  Remainder reviewed in history tab.   Important Outpatient Medications: Amiodarone 200 mg daily - Taking Albuterol neb PRN - Using twice daily Atorvastatin 20 mg daily - Taking Budesonide nebulizer 0.5 mg q12h - Using twice daily Bupropion ER 100 mg BID -Taking Cholecalciferol 125 mcg (5,000 units) daily - Taking Eliquis 5 mg BID - Taking Empaglifozin 25 mg daily - Taking Ferrous sulfate 325 mg daily - Taking Gabapentin 400 mg TID - Taking Insulin glargine 30 units daily - Taking Levothyroxine 150 mcg daily - Taking Loperamide 2 mg Q4h PRN - Not taking Metolazone 2.5 every other day - HELD last admission, NOT taking Montelukast 10 mg daily - Taking Omeprazole 20 mg daily - Taking Percocet 10-325 Q6h - Taking 4 times per day Potassium 40 mEq daily - Taking Ropinirole 0.5 QHS - Taking Spironolactone 25 mg daily - Taking Torsemide 60 mg BID - Taking Oxygen 5 L/min - Using constantly at home BiPAP QHS - Compliant nightly  New meds per [facility] med rec: Trulicity 0.75 mg every Saturday - Unknown when last taken Oxybutynin 2.5 mg BID - Taking  Remainder reviewed in medication history.   Objective: BP 122/72   Pulse 86   Temp 98.8 F (37.1 C) (Oral)   Resp (!) 25  SpO2 98%   Physical Exam: General: Somnolent female on BiPAP, large body habitus.  Responds to questions and follows instructions, but drowsy during evaluation. Eyes: No scleral icterus. ENTM: MMM.  On BiPAP. Neck: Cannot evaluate JVD due to tissue. Cardiovascular: RRR. 3/6 systolic ejection murmur heard best at LUSB.  No rubs or gallops. Respiratory: Mild tachypnea on BiPAP.  Globally diminished lung sounds, auscultation limited by body habitus.  No  wheezing, crackles. Gastrointestinal: No TTP.  No distension. MSK: Moving all extremities appropriately. Extremities: Capillary refill <2 seconds.  Significant chronic edema of BLE as below. Derm: Bilateral LE erythema overlying tense edematous skin with chronic venous skin changes, warm and mildly tender to touch.  Some overlying honey-colored crusting.  Dry, cracked skin on soles of feet bilaterally.  Photos inserted below. Neuro: Oriented to self, situation, place.  Frequent twitches of BLE consistent with severe restless leg. Psych: Diminished attention and concentration due to somnolence.          Labs:  CBC BMET  Recent Labs  Lab 09/19/23 1753 09/19/23 1809  WBC 9.9  --   HGB 11.0* 12.2  HCT 37.8 36.0  PLT 236  --    Recent Labs  Lab 09/19/23 1753 09/19/23 1809  NA 141 138  K 4.0 4.0  CL 90* 89*  CO2 38*  --   BUN 23* 32*  CREATININE 1.08* 1.10*  GLUCOSE 259* 258*  CALCIUM 8.5*  --      Pertinent additional labs: -Lactate 1.4 -BNP 237 -PT/INR 18.3/1.5 -UA pending -Blood Cx pending  Own interpretation of EKG: NSR.  No evidence of ischemia.  QTcB 508.  Largely unchanged from 08/15/2023.   Imaging Studies: -CXR: Infiltrate versus atelectasis in LLL, RML, RLL.  No CVA blunting, no effusion.  Similar to 11/14 with notably less pulmonary edema. -CT chest w/o: Bilateral geographic groundglass and consolidative airspace opacity, greatest in lower lobes.  Concerning for multifocal pneumonia though pulmonary edema could appear similarly.  Small left pleural effusion.  Pulmonary arterial hypertension.  Independently reviewed and agree with radiologist's interpretation.  Dimitry Sharion Dove, MD 09/19/2023, 10:42 PM  PGY-1, [facility] FPTS Intern pager: [phone], text pages welcome Secure chat group [facility] Teaching Service  I was personally present and performed or re-performed the history, physical exam and medical decision  making activities of this service and have verified that the service and findings are accurately documented in the resident's note.  Vonna Drafts, MD                  09/20/2023, 3:35 AM

## 2023-09-19 NOTE — Progress Notes (Signed)
ED Pharmacy Antibiotic Sign Off An antibiotic consult was received from an ED provider for vancomycin and cefepime per pharmacy dosing for sepsis. A chart review was completed to assess appropriateness.   The following one time order(s) were placed:  Vancomycin 2500 mg IV x1 Cefepime 2 g IV x1 (has tolerated cefepime in the past despite PCN allergy)  Further antibiotic and/or antibiotic pharmacy consults should be ordered by the admitting provider if indicated.   Thank you for involving pharmacy in this patient's care.  Loura Back, PharmD, BCPS Clinical Pharmacist Clinical phone for 09/19/2023 is [phone] 09/19/2023 6:26 PM

## 2023-09-19 NOTE — Progress Notes (Signed)
Pt is refusing ABG. RT explained the importance and pt refused again. No ABG obtained. Provider notified

## 2023-09-19 NOTE — ED Provider Notes (Signed)
[place] EMERGENCY DEPARTMENT AT [facility] Provider Note  CSN: [id number] Arrival date & time: 09/19/23 1726  Chief Complaint(s) Code Sepsis  HPI Chelsea [name] is a 57 y.o. female here today from [facility] care center.  EMS was called out for low oxygenation.  Patient reportedly had an O2 of 65% on nasal cannula.  Patient typically is 88% on 4 L at her baseline.  EMS reported the patient had bilateral lower extremity edema that was warm to the touch.   Past Medical History Past Medical History:  Diagnosis Date   Acquired hypothyroidism 11/13/2010   Qualifier: Diagnosis of   By: Jens Som, MD, Lyn Hollingshead      Acute congestive heart failure (HCC) 03/18/2021   Acute idiopathic gout of left hand 02/16/2019   Acute kidney injury (HCC) 05/29/2023   Acute on chronic diastolic CHF (congestive heart failure) (HCC) 09/25/2017   Acute on chronic diastolic CHF (congestive heart failure), NYHA class 3 (HCC) 09/25/2017   Acute on chronic hypoxic respiratory failure (HCC) 07/04/2022   Acute on chronic respiratory failure with hypoxia (HCC) 09/17/2022   Acute on chronic respiratory failure with hypoxia and hypercapnia (HCC) 04/07/2022   Acute respiratory failure with hypoxia and hypercarbia (HCC) 05/27/2016   Arthritis    "qwhere" (12/05/2017)   Asthma    Atrial flutter (HCC) 03/09/2021   Bleeding of the respiratory tract 04/07/2022   Bronchitis 05/30/2011   Cervical cancer (HCC) 2006   CHF (congestive heart failure) (HCC)    CHF exacerbation (HCC) 07/30/2022   Chronic diastolic heart failure (HCC) 11/14/2010      11/2010 322 lbs      Chronic heart failure with preserved ejection fraction (HCC) 02/23/2018   Chronic lower back pain    Chronic respiratory failure with hypoxia (HCC) 07/02/2018   ABG 11/2017 7.36/69   Consistent with obesity hypoventilation syndrome   Complication of anesthesia    "I have a hard time waking up from under it" (12/05/2017)   COPD  (chronic obstructive pulmonary disease) (HCC) 03/04/2015   Coronary artery disease    Cough productive of clear sputum 06/03/2023   Diarrhea 06/18/2019   Diastolic congestive heart failure (HCC)    Echo 02/21/11: EF 55-60%, moderate LVH, grade 2 diastolic dysfunction, mild to moderate aortic stenosis with a mean gradient 23 mm of mercury   DJD (degenerative joint disease) 07/03/2021   DM2 (diabetes mellitus, type 2) (HCC) 11/10/2010   Dyspnea 12/05/2017   Dyspnea on exertion 02/09/2021   Edema 03/22/2011   Essential (primary) hypertension 11/10/2010   Formatting of this note might be different from the original.  Overview:   Qualifier: Diagnosis of   By: Kem Parkinson      Last Assessment & Plan:   Watch blood pressure off of minoxidil and add medications as needed.   Essential hypertension 11/10/2010   Qualifier: Diagnosis of   By: Kem Parkinson       Gastro-esophageal reflux disease without esophagitis 11/10/2010   GERD 11/10/2010   Qualifier: Diagnosis of   By: Kem Parkinson       Hair loss 09/17/2019   Heart failure (HCC) 04/20/2021   Heart murmur    Hepatitis C    History of cervical cancer 03/22/2011   History of gout X 1   History of tobacco use 04/20/2021   Hx of atrial flutter 03/15/2021   Hyperglycemia due to type 2 diabetes mellitus (HCC) 04/20/2021   Hyperlipidemia    Hypertension  Hypertension associated with diabetes (HCC) 08/25/2018   Irritable bowel syndrome with constipation 04/20/2021   Irritable bowel syndrome with diarrhea 06/18/2019   Limitation of activity due to disability 09/18/2021   Lymphedema of both lower extremities 03/22/2011   Metabolic alkalosis 03/28/2021   Microalbuminuria 04/20/2021   Migraine    "monthly" (12/05/2017)   Mild tricuspid regurgitation 04/02/2021   Mitral valve sclerosis 04/02/2021   Mixed hyperlipidemia 04/18/2020   Moderate aortic stenosis 04/20/2021   Mononeuropathy of lower extremity 04/09/2011   Formatting of  this note might be different from the original.  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  Severe, corrected by CPAP 18 with C-flex of 3  2/13 bipap 20/14 sm ff mask h/h biflex+3cm  Formatting of this note might be different from the original.  Sleep study 06/2020: IMPRESSIONS  - Moderate obstructive sleep apnea occurred during this study (AHI = 17.7/h), mostly REM related  - No significant central sleep apnea occurred during   On home oxygen therapy    "2L all the time" (12/05/2017)   On supplemental oxygen by nasal cannula 04/20/2021   Onychomycosis 06/18/2019   OSA treated with BiPAP    "have CPAP at home too; wearing BiPAP right now" (12/05/2017)   Osteoarthritis 11/28/2010   Qualifier: Diagnosis of   By: Huntley Dec, Scott       Other long term (current) drug therapy 02/12/2022   Paroxysmal atrial fibrillation (HCC) 09/04/2021   Physical debility 05/10/2018   Formatting of this note might be different from the original.  Due to morbid obesity   Physical deconditioning 04/24/2021   Pneumonia    "several times" (12/05/2017)   Pneumonia due to gram-positive bacteria 08/24/2017   Pressure injury of both heels, unstageable (HCC) 07/03/2021   Pulmonary edema, acute (HCC) 02/21/2022   Renal insufficiency 04/20/2021   Restless leg syndrome  03/22/2021   Restrictive lung disease secondary to obesity 01/28/2022   S/P hysterectomy 02/19/2021   Tinea pedis of both feet 11/12/2018   Unspecified atrial flutter (HCC) 02/12/2022   Upper GI bleed 07/18/2022   Venous insufficiency of both lower extremities 07/02/2021   Weakness 03/20/2021   Weight loss 03/15/2021   Patient Active Problem List   Diagnosis Date Noted   Elevated serum creatinine 08/20/2023   Yeast infection 08/18/2023   Hypercapnemia 08/17/2023   Type 2 diabetes mellitus (HCC) 08/16/2023   Anemia 08/16/2023   Acute hypoxic respiratory failure (HCC) 08/15/2023   Pain of left middle finger 06/22/2023   Intertrigo 06/22/2023   Hypokalemia 06/15/2023   Hyperphosphatemia 06/04/2023   Anxiety 05/30/2023   Atrial flutter with rapid ventricular response (HCC) 05/29/2023   AKI (acute kidney injury) (HCC) 05/29/2023   Arthritis 11/13/2022   Asthma 11/13/2022   Chronic lower back pain 11/13/2022   Complication of anesthesia 11/13/2022  Coronary artery disease 11/13/2022   (HFpEF) heart failure with preserved ejection fraction (HCC) 11/13/2022   Heart murmur 11/13/2022   History of gout 11/13/2022   Hypertension 11/13/2022   Migraine 11/13/2022   On home oxygen therapy 11/13/2022   Pneumonia 11/13/2022   Chronic iron deficiency anemia 09/18/2022   Chronic disease anemia 08/01/2022   Upper GI bleed 07/18/2022   COPD (chronic obstructive pulmonary disease) with chronic bronchitis (HCC) 07/18/2022   Acute blood loss anemia 07/18/2022   Bleeding of the respiratory tract 04/07/2022   Chronic respiratory failure with hypercapnia (HCC) 02/12/2022   Polyneuropathy, unspecified 02/12/2022   Restrictive lung disease secondary to obesity 01/28/2022   Limitation of activity due to disability 09/18/2021   Paroxysmal atrial fibrillation (HCC) 09/04/2021   DJD (degenerative joint disease) 07/03/2021   Pressure injury of both heels, unstageable (HCC) 07/03/2021   Venous  insufficiency of both lower extremities 07/02/2021   Physical deconditioning 04/24/2021   Depression 04/20/2021   History of tobacco use 04/20/2021   Hyperglycemia due to type 2 diabetes mellitus (HCC) 04/20/2021   Irritable bowel syndrome with constipation 04/20/2021   Microalbuminuria 04/20/2021   On supplemental oxygen by nasal cannula 04/20/2021   Renal insufficiency 04/20/2021   Moderate aortic stenosis 04/20/2021   Mild tricuspid regurgitation 04/02/2021   Mitral valve sclerosis 04/02/2021   Morbid obesity with BMI of 50.0-59.9, adult (HCC) 04/02/2021   Non-smoker 04/02/2021   Awaiting admission to adequate facility elsewhere 03/28/2021   Metabolic alkalosis 03/28/2021   Restless leg syndrome 03/22/2021   Weakness 03/20/2021   Hx of atrial flutter 03/15/2021   Weight loss 03/15/2021   S/P hysterectomy 02/19/2021   Pulmonary nodule less than 6 cm determined by computed tomography of lung 02/09/2021   Dyspnea on exertion 02/09/2021   Nocturnal hypoxemia 06/03/2020   Mixed hyperlipidemia 04/18/2020   Hair loss 09/17/2019   Chronic pain of multiple joints 06/18/2019   Onychomycosis 06/18/2019   Acute idiopathic gout of left hand 02/16/2019   Tinea pedis of both feet 11/12/2018   Hypertension associated with diabetes (HCC) 08/25/2018   Class 3 severe obesity due to excess calories with serious comorbidity and body mass index (BMI) of 60.0 to 69.9 in adult (HCC) 08/25/2018   Physical debility 05/10/2018   Hepatitis C    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   OSA on CPAP 02/24/2018   Acute exacerbation of chronic heart failure (HCC) 09/25/2017   Pneumonia due to gram-positive bacteria 08/24/2017   Acute respiratory failure with hypoxia and hypercarbia (HCC) 05/27/2016   Hyperlipidemia 09/28/2015   Bronchitis 05/30/2011   Neuropathic pain, leg 04/09/2011   Mononeuropathy of lower extremity 04/09/2011   Edema 03/22/2011   History of cervical cancer  03/22/2011   Lymphedema 03/22/2011   Obesity hypoventilation syndrome (HCC) 01/01/2011   Osteoarthritis 11/28/2010   Hypothyroidism 11/13/2010   Essential hypertension 11/10/2010   GERD 11/10/2010   Gastro-esophageal reflux disease without esophagitis 11/10/2010   Essential (primary) hypertension 11/10/2010   Type 2 diabetes mellitus without complication (HCC) 11/10/2010   Cervical cancer (HCC) 2006   Home Medication(s) Prior to Admission medications   Medication Sig Start Date End Date Taking? Authorizing Provider  albuterol (PROVENTIL) (2.5 MG/3ML) 0.083% nebulizer solution Take 3 mLs (2.5 mg total) by nebulization every 6 (six) hours as needed for wheezing or shortness of breath (I50.32). 07/04/16   Oretha Milch, MD  amiodarone (PACERONE) 200 MG tablet Take 1 tablet (200 mg total) by mouth daily.  06/21/23   Alfredo Martinez, MD  ammonium lactate (LAC-HYDRIN) 12 % lotion Apply 1 Application topically at bedtime.    [provider]  atorvastatin (LIPITOR) 20 MG tablet Take 20 mg by mouth every evening.    [provider]  budesonide (PULMICORT) 0.5 MG/2ML nebulizer solution Inhale 0.5 mg into the lungs every 12 (twelve) hours.    [provider]  buPROPion ER (WELLBUTRIN SR) 100 MG 12 hr tablet Take 1 tablet (100 mg total) by mouth 2 (two) times daily. 06/08/23   Alfredo Martinez, MD  Cholecalciferol 125 MCG (5000 UT) TABS Take 1 tablet (5,000 Units total) by mouth daily. 07/10/22   [name], Werner Lean, MD  ELIQUIS 5 MG TABS tablet Take 5 mg by mouth 2 (two) times daily. 04/12/21   [provider]  empagliflozin (JARDIANCE) 25 MG TABS tablet Take 1 tablet (25 mg total) by mouth daily. 08/20/23   Celine Mans, MD  ferrous sulfate 325 (65 FE) MG EC tablet Take 325 mg by mouth daily.    [provider]  gabapentin (NEURONTIN) 400 MG capsule Take 400 mg by mouth 3 (three) times daily.    [provider]  Insulin Glargine (BASAGLAR KWIKPEN) 100  UNIT/ML Inject 30 Units into the skin daily. 08/21/23   Celine Mans, MD  levothyroxine (SYNTHROID) 150 MCG tablet Take 1 tablet (150 mcg total) by mouth daily at 6 (six) AM. Patient taking differently: Take 150 mcg by mouth daily. 07/10/22   [name], Werner Lean, MD  loperamide (IMODIUM) 2 MG capsule Take 2 mg by mouth every 4 (four) hours as needed for diarrhea or loose stools. Patient not taking: Reported on 08/15/2023    [provider]  melatonin 3 MG TABS tablet Take 1 tablet (3 mg total) by mouth at bedtime. 08/19/23   Celine Mans, MD  montelukast (SINGULAIR) 10 MG tablet Take 10 mg by mouth at bedtime.    [provider]  Multiple Vitamin (MULTIVITAMIN) tablet Take 1 tablet by mouth daily. Patient not taking: Reported on 08/15/2023    [provider]  NON FORMULARY Apply BIPAP every night at bedtime.    [provider]  nystatin cream (MYCOSTATIN) Apply topically 2 (two) times daily. Patient not taking: Reported on 08/15/2023 06/24/23   Alfredo Martinez, MD  omeprazole (PRILOSEC) 20 MG capsule Take 20 mg by mouth daily. 10/02/22   [provider]  oxyCODONE-acetaminophen (PERCOCET) 10-325 MG tablet Take 1 tablet by mouth every 6 (six) hours. 06/21/23   Alfredo Martinez, MD  OXYGEN Inhale 5 L/min into the lungs in the morning and at bedtime. Connect to BIPAP qhs    [provider]  potassium chloride SA (KLOR-CON M) 20 MEQ tablet Take 2 tablets (40 mEq total) by mouth daily. 06/21/23   Alfredo Martinez, MD  rOPINIRole (REQUIP) 0.5 MG tablet Take 1 tablet (0.5 mg total) by mouth at bedtime. 06/21/23   Alfredo Martinez, MD  spironolactone (ALDACTONE) 25 MG tablet Take 1 tablet (25 mg total) by mouth daily. 10/17/22   Robbie Lis M, PA-C  torsemide (DEMADEX) 20 MG tablet Take 60 mg by mouth 2 (two) times daily.    [provider]  Past Surgical History Past Surgical History:  Procedure Laterality Date   ABDOMINAL SURGERY  2006 X 2   "TAH incision was infected"   BIOPSY  07/21/2022   Procedure: BIOPSY;  Surgeon: Kathi Der, MD;  Location: [facility];  Service: Gastroenterology;;   CHOLECYSTECTOMY OPEN     ESOPHAGOGASTRODUODENOSCOPY N/A 07/21/2022   Procedure: ESOPHAGOGASTRODUODENOSCOPY (EGD);  Surgeon: Kathi Der, MD;  Location: [facility];  Service: Gastroenterology;  Laterality: N/A;   TONSILLECTOMY     TOTAL ABDOMINAL HYSTERECTOMY  2006   TRACHEOSTOMY REVISION N/A 04/09/2022   Procedure: CONTROL OF BLEEDING,TRACHEOSTOMY SITE;  Surgeon: Christia Reading, MD;  Location: [facility] OR;  Service: ENT;  Laterality: N/A;   Family History Family History  Problem Relation Age of Onset   Heart disease Father     Social History Social History   Tobacco Use   Smoking status: Never   Smokeless tobacco: Never  Vaping Use   Vaping status: Never Used  Substance Use Topics   Alcohol use: Not Currently   Drug use: No   Allergies Iodinated contrast media, Penicillins, Insulin lispro, and Minoxidil  Review of Systems Review of Systems  Physical Exam Vital Signs  I have reviewed the triage vital signs BP 122/72   Pulse 86   Temp 98.8 F (37.1 C) (Oral)   Resp (!) 25   SpO2 98%   Physical Exam Vitals reviewed.  Constitutional:      Comments: Morbidly obese Somnolent  Eyes:     Pupils: Pupils are equal, round, and reactive to light.  Cardiovascular:     Rate and Rhythm: Normal rate.  Pulmonary:     Comments: Diminished air entry bilaterally Abdominal:     General: Abdomen is flat.     Palpations: Abdomen is soft.  Musculoskeletal:        General: Normal range of motion.  Skin:    General: Skin is warm.     Comments: Bilateral lower extremity edema, warmth, erythema  Neurological:     Comments: Somnolent     ED Results and Treatments Labs (all labs ordered  are listed, but only abnormal results are displayed) Labs Reviewed  COMPREHENSIVE METABOLIC PANEL - Abnormal; Notable for the following components:      Result Value   Chloride 90 (*)    CO2 38 (*)    Glucose, Bld 259 (*)    BUN 23 (*)    Creatinine, Ser 1.08 (*)    Calcium 8.5 (*)    Albumin 2.8 (*)    AST 13 (*)    GFR, Estimated 59 (*)    All other components within normal limits  CBC WITH DIFFERENTIAL/PLATELET - Abnormal; Notable for the following components:   Hemoglobin 11.0 (*)    MCHC 29.1 (*)    All other components within normal limits  PROTIME-INR - Abnormal; Notable for the following components:   Prothrombin Time 18.3 (*)    INR 1.5 (*)    All other components within normal limits  APTT - Abnormal; Notable for the following components:   aPTT 38 (*)    All other components within normal limits  BRAIN NATRIURETIC PEPTIDE - Abnormal; Notable for the following components:   B Natriuretic Peptide 236.6 (*)    All other components within normal limits  I-STAT CHEM 8, ED - Abnormal; Notable for the following components:   Chloride 89 (*)    BUN 32 (*)    Creatinine, Ser 1.10 (*)    Glucose, Bld 258 (*)  Calcium, Ion 0.97 (*)    TCO2 45 (*)    All other components within normal limits  RESP PANEL BY RT-PCR (RSV, FLU A&B, COVID)  RVPGX2  CULTURE, BLOOD (ROUTINE X 2)  CULTURE, BLOOD (ROUTINE X 2)  URINALYSIS, W/ REFLEX TO CULTURE (INFECTION SUSPECTED)  I-STAT CG4 LACTIC ACID, ED  I-STAT ARTERIAL BLOOD GAS, ED  I-STAT CG4 LACTIC ACID, ED                                                                                                                          Radiology DG Chest Port 1 View Result Date: 09/19/2023 CLINICAL DATA:  Possible sepsis. EXAM: PORTABLE CHEST 1 VIEW COMPARISON:  08/15/2023, 06/14/2023. FINDINGS: The heart is enlarged and the mediastinal contour stable. Patchy airspace disease is noted in the mid to lower lung fields bilaterally. No definite  effusion or pneumothorax is seen. No acute osseous abnormality. IMPRESSION: 1. Patchy airspace disease in the mid to lower lung fields, possible atelectasis or infiltrate. 2. Cardiomegaly. Electronically Signed   By: Thornell Sartorius M.D.   On: 09/19/2023 21:17    Pertinent labs & imaging results that were available during my care of the patient were reviewed by me and considered in my medical decision making (see MDM for details).  Medications Ordered in ED Medications  Vancomycin (VANCOCIN) 1,500 mg in sodium chloride 0.9 % 500 mL IVPB (1,500 mg Intravenous New Bag/Given 09/19/23 2051)    Followed by  vancomycin (VANCOCIN) IVPB 1000 mg/200 mL premix (1,000 mg Intravenous New Bag/Given 09/19/23 1907)  sodium chloride 0.9 % bolus 1,000 mL (1,000 mLs Intravenous New Bag/Given 09/19/23 1812)  ceFEPIme (MAXIPIME) 2 g in sodium chloride 0.9 % 100 mL IVPB (0 g Intravenous Stopped 09/19/23 1908)                                                                                                                                     Procedures .Critical Care  Performed by: Arletha Pili, DO Authorized by: Arletha Pili, DO   Critical care provider statement:    Critical care time (minutes):  30   Critical care was necessary to treat or prevent imminent or life-threatening deterioration of the following conditions:  Sepsis and respiratory failure   Critical care was time spent personally by me on the following activities:  Development of treatment plan with patient or surrogate,  discussions with consultants, evaluation of patient's response to treatment, examination of patient, ordering and review of laboratory studies, ordering and review of radiographic studies, ordering and performing treatments and interventions, pulse oximetry, re-evaluation of patient's condition and review of old charts   (including critical care time)  Medical Decision Making / ED Course   This patient presents to the ED  for concern of decreased O2, this involves an extensive number of treatment options, and is a complaint that carries with it a high risk of complications and morbidity.  The differential diagnosis includes hypercapnia, hypoventilation, obesity hypoventilation syndrome, COPD exacerbation, CHF exacerbation, pneumonia, cellulitis  MDM: This medically complex patient with multiple medical comorbidities which appear to be poorly controlled.  In the time that the patient was moved from EMS stretcher to our bed, her O2 sat dropped to the low 70s.  Based on the patient's appearance, is concern for hypoventilation and hypercapnia.  The patient placed on BiPAP which did improve the patient's mentation.  Her chest x-ray shows what appears to be some bilateral pulmonary edema, this is consistent with her lower extremity edema.  However with her obesity, x-ray is a bit more difficult to interpret.  She does not elevated BNP, often underestimated in obese patients.  Will obtain CT imaging of the patient's chest to better characterize fluid collections.  Patient with bilateral lower extremity edema, does have some pretty significant redness in the area.  May have developed some cellulitis on top of her chronic lymphedema.  Antibiotics ordered.  I do not believe patient has sepsis at this time.  Reassessment 10:15 PM.  Patient doing well on BiPAP.  I think that a lot of her issues are hypoventilation driven.  Patient refused ABG, given that she is able to verbalize to me that is overall reassuring.  She does appear quite a bit better than when she arrived.  Do think patient would benefit from continued BiPAP overnight, likely needs a CPAP for sleep.  Will admit to stepdown unit.  Additional history obtained: -Additional history obtained from EMS -External records from outside source obtained and reviewed including: Chart review including previous notes, labs, imaging, consultation notes   Lab Tests: -I ordered,  reviewed, and interpreted labs.   The pertinent results include:   Labs Reviewed  COMPREHENSIVE METABOLIC PANEL - Abnormal; Notable for the following components:      Result Value   Chloride 90 (*)    CO2 38 (*)    Glucose, Bld 259 (*)    BUN 23 (*)    Creatinine, Ser 1.08 (*)    Calcium 8.5 (*)    Albumin 2.8 (*)    AST 13 (*)    GFR, Estimated 59 (*)    All other components within normal limits  CBC WITH DIFFERENTIAL/PLATELET - Abnormal; Notable for the following components:   Hemoglobin 11.0 (*)    MCHC 29.1 (*)    All other components within normal limits  PROTIME-INR - Abnormal; Notable for the following components:   Prothrombin Time 18.3 (*)    INR 1.5 (*)    All other components within normal limits  APTT - Abnormal; Notable for the following components:   aPTT 38 (*)    All other components within normal limits  BRAIN NATRIURETIC PEPTIDE - Abnormal; Notable for the following components:   B Natriuretic Peptide 236.6 (*)    All other components within normal limits  I-STAT CHEM 8, ED - Abnormal; Notable for the following components:  Chloride 89 (*)    BUN 32 (*)    Creatinine, Ser 1.10 (*)    Glucose, Bld 258 (*)    Calcium, Ion 0.97 (*)    TCO2 45 (*)    All other components within normal limits  RESP PANEL BY RT-PCR (RSV, FLU A&B, COVID)  RVPGX2  CULTURE, BLOOD (ROUTINE X 2)  CULTURE, BLOOD (ROUTINE X 2)  URINALYSIS, W/ REFLEX TO CULTURE (INFECTION SUSPECTED)  I-STAT CG4 LACTIC ACID, ED  I-STAT ARTERIAL BLOOD GAS, ED  I-STAT CG4 LACTIC ACID, ED      EKG sinus rhythm, no ST segment depressions or elevations, no evidence of acute ischemia.  EKG Interpretation Date/Time:  Thursday September 19 2023 17:37:49 EST Ventricular Rate:  90 PR Interval:  162 QRS Duration:  100 QT Interval:  415 QTC Calculation: 508 R Axis:   69  Text Interpretation: Sinus rhythm Borderline ST depression, diffuse leads Borderline prolonged QT interval Confirmed by Anders Simmonds [phone]) on 09/19/2023 10:18:26 PM         Imaging Studies ordered: I ordered imaging studies including chest x-ray, chest CT I independently visualized and interpreted imaging. I agree with the radiologist interpretation   Medicines ordered and prescription drug management: Meds ordered this encounter  Medications   sodium chloride 0.9 % bolus 1,000 mL    Reason 30 mL/kg dose is not being ordered:   Non-Septic Shock Clinical Presentation   DISCONTD: levofloxacin (LEVAQUIN) IVPB 750 mg    Antibiotic Indication::   CAP   DISCONTD: cefTRIAXone (ROCEPHIN) 2 g in sodium chloride 0.9 % 100 mL IVPB    Antibiotic Indication::   Other Indication (list below)    Other Indication::   sepsis   ceFEPIme (MAXIPIME) 2 g in sodium chloride 0.9 % 100 mL IVPB    Antibiotic Indication::   Sepsis   FOLLOWED BY Linked Order Group    Vancomycin (VANCOCIN) 1,500 mg in sodium chloride 0.9 % 500 mL IVPB     Indication::   Sepsis    vancomycin (VANCOCIN) IVPB 1000 mg/200 mL premix     Indication::   Sepsis    -I have reviewed the patients home medicines and have made adjustments as needed  Critical interventions Management of acute hypoxic respiratory failure   Cardiac Monitoring: The patient was maintained on a cardiac monitor.  I personally viewed and interpreted the cardiac monitored which showed an underlying rhythm of: Sinus rhythm  Social Determinants of Health:  Factors impacting patients care include: Multiple medical comorbidities which are poorly controlled including hypoxic respiratory failure, pulmonary edema and heart failure   Reevaluation: After the interventions noted above, I reevaluated the patient and found that they have :improved  Co morbidities that complicate the patient evaluation  Past Medical History:  Diagnosis Date   Acquired hypothyroidism 11/13/2010   Qualifier: Diagnosis of   By: Jens Som, MD, Lyn Hollingshead      Acute congestive heart failure  (HCC) 03/18/2021   Acute idiopathic gout of left hand 02/16/2019   Acute kidney injury (HCC) 05/29/2023   Acute on chronic diastolic CHF (congestive heart failure) (HCC) 09/25/2017   Acute on chronic diastolic CHF (congestive heart failure), NYHA class 3 (HCC) 09/25/2017   Acute on chronic hypoxic respiratory failure (HCC) 07/04/2022   Acute on chronic respiratory failure with hypoxia (HCC) 09/17/2022   Acute on chronic respiratory failure with hypoxia and hypercapnia (HCC) 04/07/2022   Acute respiratory failure with hypoxia and hypercarbia (HCC) 05/27/2016   Arthritis    "  qwhere" (12/05/2017)   Asthma    Atrial flutter (HCC) 03/09/2021   Bleeding of the respiratory tract 04/07/2022   Bronchitis 05/30/2011   Cervical cancer (HCC) 2006   CHF (congestive heart failure) (HCC)    CHF exacerbation (HCC) 07/30/2022   Chronic diastolic heart failure (HCC) 11/14/2010      11/2010 322 lbs      Chronic heart failure with preserved ejection fraction (HCC) 02/23/2018   Chronic lower back pain    Chronic respiratory failure with hypoxia (HCC) 07/02/2018   ABG 11/2017 7.36/69   Consistent with obesity hypoventilation syndrome   Complication of anesthesia    "I have a hard time waking up from under it" (12/05/2017)   COPD (chronic obstructive pulmonary disease) (HCC) 03/04/2015   Coronary artery disease    Cough productive of clear sputum 06/03/2023   Diarrhea 06/18/2019   Diastolic congestive heart failure (HCC)    Echo 02/21/11: EF 55-60%, moderate LVH, grade 2 diastolic dysfunction, mild to moderate aortic stenosis with a mean gradient 23 mm of mercury   DJD (degenerative joint disease) 07/03/2021   DM2 (diabetes mellitus, type 2) (HCC) 11/10/2010   Dyspnea 12/05/2017   Dyspnea on exertion 02/09/2021   Edema 03/22/2011   Essential (primary) hypertension 11/10/2010   Formatting of this note might be different from the original.  Overview:   Qualifier: Diagnosis of   By: Kem Parkinson      Last  Assessment & Plan:   Watch blood pressure off of minoxidil and add medications as needed.   Essential hypertension 11/10/2010   Qualifier: Diagnosis of   By: Kem Parkinson       Gastro-esophageal reflux disease without esophagitis 11/10/2010   GERD 11/10/2010   Qualifier: Diagnosis of   By: Kem Parkinson       Hair loss 09/17/2019   Heart failure (HCC) 04/20/2021   Heart murmur    Hepatitis C    History of cervical cancer 03/22/2011   History of gout X 1   History of tobacco use 04/20/2021   Hx of atrial flutter 03/15/2021   Hyperglycemia due to type 2 diabetes mellitus (HCC) 04/20/2021   Hyperlipidemia    Hypertension    Hypertension associated with diabetes (HCC) 08/25/2018   Irritable bowel syndrome with constipation 04/20/2021   Irritable bowel syndrome with diarrhea 06/18/2019   Limitation of activity due to disability 09/18/2021   Lymphedema of both lower extremities 03/22/2011   Metabolic alkalosis 03/28/2021   Microalbuminuria 04/20/2021   Migraine    "monthly" (12/05/2017)   Mild tricuspid regurgitation 04/02/2021   Mitral valve sclerosis 04/02/2021   Mixed hyperlipidemia 04/18/2020   Moderate aortic stenosis 04/20/2021   Mononeuropathy of lower extremity 04/09/2011   Formatting of this note might be different from the original.  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  Severe, corrected by CPAP 18 with C-flex of 3  2/13 bipap 20/14 sm ff mask h/h biflex+3cm  Formatting of this note might be different from the original.  Sleep study 06/2020: IMPRESSIONS  - Moderate obstructive sleep apnea occurred during this study (AHI = 17.7/h), mostly REM related  - No significant central sleep apnea occurred during   On home oxygen therapy    "2L all the time" (12/05/2017)   On supplemental oxygen by nasal cannula 04/20/2021   Onychomycosis 06/18/2019   OSA treated with BiPAP    "have CPAP at home too; wearing BiPAP right now" (12/05/2017)   Osteoarthritis 11/28/2010   Qualifier: Diagnosis of   By: Huntley Dec, Scott       Other long term (current) drug therapy 02/12/2022   Paroxysmal atrial fibrillation (HCC) 09/04/2021   Physical debility 05/10/2018   Formatting of this note might be different from the original.  Due to morbid obesity   Physical deconditioning 04/24/2021   Pneumonia    "several times" (12/05/2017)   Pneumonia due to gram-positive bacteria 08/24/2017   Pressure injury of both heels, unstageable (HCC) 07/03/2021   Pulmonary edema, acute (HCC) 02/21/2022   Renal insufficiency 04/20/2021   Restless leg syndrome 03/22/2021   Restrictive lung disease secondary to obesity 01/28/2022   S/P hysterectomy 02/19/2021   Tinea pedis of both feet 11/12/2018   Unspecified atrial flutter (HCC) 02/12/2022   Upper GI bleed 07/18/2022   Venous insufficiency of both lower extremities 07/02/2021   Weakness 03/20/2021   Weight loss 03/15/2021      Dispostion: Admit     Final Clinical Impression(s) / ED Diagnoses Final diagnoses:  Acute hypoxic respiratory failure (HCC)     @PCDICTATION @    Anders Simmonds T, DO 09/19/23 2224

## 2023-09-19 NOTE — Assessment & Plan Note (Deleted)
 Patient with reported hypoxia to the 70s at SNF, found to be tachypneic, mildly acidotic (7.349), hypercapnic (96.6) in ED. Patient has not used her Bipap for past few nights. Initial workup included elevated BNP (243.8) with fluid overload noted on CXR and exam. Most likely mixed etiology including OHS/OSA and HF exacerbation. Possible component of COPD exacerbation as well given cough and sputum change. Low suspicion for infection, PE, ACS at this time per ddx.  - Admit to Progressive with FMTS attending Dr Lum Babe - Continue Bipap. VBG ordered to reassess pH and CO2 following further bipap use. - Once stable, restart home 8L O2 [place] during day with Bipap nightly  - Cont home albuterol neb q6 prn, pulmicort neb q12, montelukast 10 nightly  - Continuous cardiac telemetry  - Continuous pulse ox - Neuro checks q4 - Vital signs per floor protocol - Monitor fever curve. Consider abx as needed for possible infection.  - Consider CTPE for worsening symptoms - AM CBC, BMP, Mg - replete electrolytes as needed for K >4, Mg > 2

## 2023-09-19 NOTE — ED Notes (Signed)
1st lactic in normal range, 2nd not needed unless Dr says otherwise

## 2023-09-19 NOTE — Progress Notes (Signed)
Hospitalist Progress Note:  I initially received an admission request for this patient.  Upon additional review, this patient was discharged from [facility] on 08/21/23 by the [facility] Teaching Service. As this discharge occurred within the last 30 days, this will represent a bounce-back to the [facility] Medicine Teaching Service. I have contacted the ED physician, Dr. Andria Meuse, to update him on the above.     Newton Pigg, DO Hospitalist

## 2023-09-19 NOTE — ED Notes (Addendum)
Contacted RT about obtaining ABG. RT states they will be here promptly. EDP notified.

## 2023-09-19 NOTE — ED Triage Notes (Signed)
Pt bib [facility] coming from [facility]. EMS was called out for low SpO2. Pt is COPD pt and pt was on 65% on [place] (She normally is at 88% on 4L baseline). Bilateral edema to lower extremities and hot to the touch. EMS put pt on NRB, bringing SpO2 to 98%. Pt is oriented. GCS 15.    EMS vital signs: 90 HR 98% NRB  342 cbg 28 RR 120/58

## 2023-09-20 ENCOUNTER — Other Ambulatory Visit (HOSPITAL_COMMUNITY): Payer: Self-pay

## 2023-09-20 ENCOUNTER — Telehealth (HOSPITAL_COMMUNITY): Payer: Self-pay

## 2023-09-20 ENCOUNTER — Other Ambulatory Visit: Payer: Self-pay

## 2023-09-20 DIAGNOSIS — R339 Retention of urine, unspecified: Secondary | ICD-10-CM | POA: Insufficient documentation

## 2023-09-20 DIAGNOSIS — R079 Chest pain, unspecified: Secondary | ICD-10-CM

## 2023-09-20 DIAGNOSIS — J9612 Chronic respiratory failure with hypercapnia: Secondary | ICD-10-CM

## 2023-09-20 DIAGNOSIS — Z87898 Personal history of other specified conditions: Secondary | ICD-10-CM | POA: Insufficient documentation

## 2023-09-20 DIAGNOSIS — J9601 Acute respiratory failure with hypoxia: Secondary | ICD-10-CM

## 2023-09-20 HISTORY — DX: Personal history of other specified conditions: Z87.898

## 2023-09-20 LAB — BLOOD GAS, VENOUS
Acid-Base Excess: 17.1 mmol/L — ABNORMAL HIGH (ref 0.0–2.0)
Acid-base deficit: 7.9 mmol/L — ABNORMAL HIGH (ref 0.0–2.0)
Bicarbonate: 16.9 mmol/L — ABNORMAL LOW (ref 20.0–28.0)
Bicarbonate: 47.5 mmol/L — ABNORMAL HIGH (ref 20.0–28.0)
O2 Saturation: 91.8 %
O2 Saturation: 98 %
Patient temperature: 37
Patient temperature: 37.5
pCO2, Ven: 33 mm[Hg] — ABNORMAL LOW (ref 44–60)
pCO2, Ven: 88 mm[Hg] (ref 44–60)
pH, Ven: 7.32 (ref 7.25–7.43)
pH, Ven: 7.34 (ref 7.25–7.43)
pO2, Ven: 100 mm[Hg] — ABNORMAL HIGH (ref 32–45)
pO2, Ven: <31 mm[Hg] — CL (ref 32–45)

## 2023-09-20 LAB — BASIC METABOLIC PANEL
Anion gap: 9 (ref 5–15)
Anion gap: 10 (ref 5–15)
BUN: 22 mg/dL — ABNORMAL HIGH (ref 6–20)
BUN: 18 mg/dL (ref 6–20)
CO2: 41 mmol/L — ABNORMAL HIGH (ref 22–32)
CO2: 40 mmol/L — ABNORMAL HIGH (ref 22–32)
Calcium: 8.5 mg/dL — ABNORMAL LOW (ref 8.9–10.3)
Calcium: 8.4 mg/dL — ABNORMAL LOW (ref 8.9–10.3)
Chloride: 94 mmol/L — ABNORMAL LOW (ref 98–111)
Chloride: 91 mmol/L — ABNORMAL LOW (ref 98–111)
Creatinine, Ser: 0.91 mg/dL (ref 0.44–1.00)
Creatinine, Ser: 0.74 mg/dL (ref 0.44–1.00)
GFR, Estimated: >60 mL/min (ref >60)
GFR, Estimated: >60 mL/min (ref >60)
Glucose, Bld: 223 mg/dL — ABNORMAL HIGH (ref 70–99)
Glucose, Bld: 190 mg/dL — ABNORMAL HIGH (ref 70–99)
Potassium: 3.7 mmol/L (ref 3.5–5.1)
Potassium: 3.4 mmol/L — ABNORMAL LOW (ref 3.5–5.1)
Sodium: 144 mmol/L (ref 135–145)
Sodium: 141 mmol/L (ref 135–145)

## 2023-09-20 LAB — CBC
HCT: 36.1 % (ref 36.0–46.0)
HCT: 36.2 % (ref 36.0–46.0)
Hemoglobin: 10.4 g/dL — ABNORMAL LOW (ref 12.0–15.0)
Hemoglobin: 10.5 g/dL — ABNORMAL LOW (ref 12.0–15.0)
MCH: 26.9 pg (ref 26.0–34.0)
MCH: 27.3 pg (ref 26.0–34.0)
MCHC: 28.8 g/dL — ABNORMAL LOW (ref 30.0–36.0)
MCHC: 29 g/dL — ABNORMAL LOW (ref 30.0–36.0)
MCV: 93.3 fL (ref 80.0–100.0)
MCV: 94 fL (ref 80.0–100.0)
Platelets: 222 10*3/uL (ref 150–400)
Platelets: 237 10*3/uL (ref 150–400)
RBC: 3.87 MIL/uL (ref 3.87–5.11)
RBC: 3.85 MIL/uL — ABNORMAL LOW (ref 3.87–5.11)
RDW: 15.2 % (ref 11.5–15.5)
RDW: 15.2 % (ref 11.5–15.5)
WBC: 9 10*3/uL (ref 4.0–10.5)
WBC: 10.6 10*3/uL — ABNORMAL HIGH (ref 4.0–10.5)
nRBC: 0 % (ref 0.0–0.2)
nRBC: 0 % (ref 0.0–0.2)

## 2023-09-20 LAB — GLUCOSE, CAPILLARY
Glucose-Capillary: 195 mg/dL — ABNORMAL HIGH (ref 70–99)
Glucose-Capillary: 178 mg/dL — ABNORMAL HIGH (ref 70–99)
Glucose-Capillary: 210 mg/dL — ABNORMAL HIGH (ref 70–99)

## 2023-09-20 LAB — CBG MONITORING, ED: Glucose-Capillary: 191 mg/dL — ABNORMAL HIGH (ref 70–99)

## 2023-09-20 LAB — MAGNESIUM: Magnesium: 2.1 mg/dL (ref 1.7–2.4)

## 2023-09-20 LAB — MRSA NEXT GEN BY PCR, NASAL: MRSA by PCR Next Gen: DETECTED — AB

## 2023-09-20 LAB — TROPONIN I (HIGH SENSITIVITY)
Troponin I (High Sensitivity): 8 ng/L (ref <18)
Troponin I (High Sensitivity): 8 ng/L (ref <18)

## 2023-09-20 MED ORDER — FERROUS SULFATE 325 (65 FE) MG PO TABS
325.0000 mg | ORAL_TABLET | Freq: Every day | ORAL | Status: DC
  Administered 2023-09-20 – 2023-09-24 (×5): 325 mg via ORAL
  Filled 2023-09-20 (×5): qty 1

## 2023-09-20 MED ORDER — EMPAGLIFLOZIN 25 MG PO TABS
25.0000 mg | ORAL_TABLET | Freq: Every day | ORAL | Status: DC
Start: 2023-09-20 — End: 2023-09-24
  Administered 2023-09-21 – 2023-09-24 (×4): 25 mg via ORAL
  Filled 2023-09-20 (×6): qty 1

## 2023-09-20 MED ORDER — VANCOMYCIN HCL 750 MG/150ML IV SOLN
750.0000 mg | Freq: Two times a day (BID) | INTRAVENOUS | Status: DC
  Administered 2023-09-20: 750 mg via INTRAVENOUS
  Filled 2023-09-20: qty 150

## 2023-09-20 MED ORDER — ACETAMINOPHEN 325 MG PO TABS
650.0000 mg | ORAL_TABLET | Freq: Four times a day (QID) | ORAL | Status: DC | PRN
  Administered 2023-09-20 – 2023-09-21 (×2): 650 mg via ORAL
  Filled 2023-09-20 (×2): qty 2

## 2023-09-20 MED ORDER — ATORVASTATIN CALCIUM 10 MG PO TABS
20.0000 mg | ORAL_TABLET | Freq: Every evening | ORAL | Status: DC
  Administered 2023-09-21 – 2023-09-23 (×3): 20 mg via ORAL
  Filled 2023-09-20 (×3): qty 2

## 2023-09-20 MED ORDER — APIXABAN 5 MG PO TABS
5.0000 mg | ORAL_TABLET | Freq: Two times a day (BID) | ORAL | Status: DC
Start: 2023-09-20 — End: 2023-09-24
  Administered 2023-09-20 – 2023-09-24 (×9): 5 mg via ORAL
  Filled 2023-09-20 (×10): qty 1

## 2023-09-20 MED ORDER — POTASSIUM CHLORIDE 10 MEQ/100ML IV SOLN
10.0000 meq | INTRAVENOUS | Status: AC
  Administered 2023-09-20 (×4): 10 meq via INTRAVENOUS
  Filled 2023-09-20 (×4): qty 100

## 2023-09-20 MED ORDER — KETOROLAC TROMETHAMINE 15 MG/ML IJ SOLN
15.0000 mg | Freq: Once | INTRAMUSCULAR | Status: AC
  Administered 2023-09-20: 15 mg via INTRAVENOUS
  Filled 2023-09-20: qty 1

## 2023-09-20 MED ORDER — AMIODARONE HCL 200 MG PO TABS
200.0000 mg | ORAL_TABLET | Freq: Every day | ORAL | Status: DC
Start: 2023-09-20 — End: 2023-09-24
  Administered 2023-09-20 – 2023-09-24 (×5): 200 mg via ORAL
  Filled 2023-09-20 (×5): qty 1

## 2023-09-20 MED ORDER — SODIUM CHLORIDE 0.9 % IV SOLN
1.0000 g | INTRAVENOUS | Status: AC
  Administered 2023-09-20 – 2023-09-23 (×4): 1 g via INTRAVENOUS
  Filled 2023-09-20 (×4): qty 10

## 2023-09-20 MED ORDER — FUROSEMIDE 10 MG/ML IJ SOLN
40.0000 mg | Freq: Once | INTRAMUSCULAR | Status: AC
  Administered 2023-09-20: 40 mg via INTRAVENOUS
  Filled 2023-09-20: qty 4

## 2023-09-20 MED ORDER — DICLOFENAC SODIUM 1 % EX GEL
2.0000 g | CUTANEOUS | Status: DC | PRN
Start: 2023-09-20 — End: 2023-09-24

## 2023-09-20 MED ORDER — LEVOTHYROXINE SODIUM 75 MCG PO TABS
150.0000 ug | ORAL_TABLET | Freq: Every day | ORAL | Status: DC
  Administered 2023-09-21 – 2023-09-24 (×4): 150 ug via ORAL
  Filled 2023-09-20 (×5): qty 2

## 2023-09-20 MED ORDER — SODIUM CHLORIDE 0.9% FLUSH
3.0000 mL | Freq: Two times a day (BID) | INTRAVENOUS | Status: DC
  Administered 2023-09-20 – 2023-09-24 (×9): 3 mL via INTRAVENOUS

## 2023-09-20 MED ORDER — ONDANSETRON HCL 4 MG/2ML IJ SOLN
4.0000 mg | Freq: Four times a day (QID) | INTRAMUSCULAR | Status: DC | PRN
  Administered 2023-09-20 – 2023-09-21 (×2): 4 mg via INTRAVENOUS
  Filled 2023-09-20 (×2): qty 2

## 2023-09-20 MED ORDER — MONTELUKAST SODIUM 10 MG PO TABS
10.0000 mg | ORAL_TABLET | Freq: Every day | ORAL | Status: DC
Start: 2023-09-20 — End: 2023-09-24
  Administered 2023-09-20 – 2023-09-23 (×4): 10 mg via ORAL
  Filled 2023-09-20 (×5): qty 1

## 2023-09-20 MED ORDER — INSULIN ASPART 100 UNIT/ML IJ SOLN
0.0000 [IU] | Freq: Three times a day (TID) | INTRAMUSCULAR | Status: DC
  Administered 2023-09-20 (×3): 3 [IU] via SUBCUTANEOUS
  Administered 2023-09-21: 5 [IU] via SUBCUTANEOUS
  Administered 2023-09-21 – 2023-09-22 (×3): 3 [IU] via SUBCUTANEOUS

## 2023-09-20 MED ORDER — TORSEMIDE 20 MG PO TABS
60.0000 mg | ORAL_TABLET | Freq: Two times a day (BID) | ORAL | Status: DC
  Administered 2023-09-20 – 2023-09-22 (×4): 60 mg via ORAL
  Filled 2023-09-20 (×4): qty 3

## 2023-09-20 MED ORDER — BUPROPION HCL ER (SR) 100 MG PO TB12
100.0000 mg | ORAL_TABLET | Freq: Two times a day (BID) | ORAL | Status: DC
Start: 2023-09-20 — End: 2023-09-24
  Administered 2023-09-21 – 2023-09-24 (×7): 100 mg via ORAL
  Filled 2023-09-20 (×11): qty 1

## 2023-09-20 MED ORDER — FUROSEMIDE 10 MG/ML IJ SOLN
80.0000 mg | Freq: Once | INTRAMUSCULAR | Status: AC
  Administered 2023-09-20: 80 mg via INTRAVENOUS
  Filled 2023-09-20: qty 8

## 2023-09-20 MED ORDER — IPRATROPIUM-ALBUTEROL 0.5-2.5 (3) MG/3ML IN SOLN
3.0000 mL | RESPIRATORY_TRACT | Status: DC | PRN
  Administered 2023-09-21: 3 mL via RESPIRATORY_TRACT
  Filled 2023-09-20: qty 3

## 2023-09-20 MED ORDER — SODIUM CHLORIDE 0.9 % IV SOLN
500.0000 mg | INTRAVENOUS | Status: AC
  Administered 2023-09-20 – 2023-09-22 (×3): 500 mg via INTRAVENOUS
  Filled 2023-09-20 (×3): qty 5

## 2023-09-20 MED ORDER — VITAMIN D 25 MCG (1000 UNIT) PO TABS
5000.0000 [IU] | ORAL_TABLET | Freq: Every day | ORAL | Status: DC
  Administered 2023-09-20 – 2023-09-24 (×5): 5000 [IU] via ORAL
  Filled 2023-09-20 (×5): qty 5

## 2023-09-20 MED ORDER — PANTOPRAZOLE SODIUM 40 MG PO TBEC
40.0000 mg | DELAYED_RELEASE_TABLET | Freq: Every day | ORAL | Status: DC
Start: 2023-09-20 — End: 2023-09-24
  Administered 2023-09-20 – 2023-09-24 (×5): 40 mg via ORAL
  Filled 2023-09-20 (×5): qty 1

## 2023-09-20 MED ORDER — BUDESONIDE 0.5 MG/2ML IN SUSP
0.5000 mg | Freq: Two times a day (BID) | RESPIRATORY_TRACT | Status: DC
Start: 2023-09-20 — End: 2023-09-24
  Administered 2023-09-20 – 2023-09-24 (×10): 0.5 mg via RESPIRATORY_TRACT
  Filled 2023-09-20 (×10): qty 2

## 2023-09-20 MED ORDER — SPIRONOLACTONE 25 MG PO TABS
25.0000 mg | ORAL_TABLET | Freq: Every day | ORAL | Status: DC
  Administered 2023-09-20 – 2023-09-24 (×5): 25 mg via ORAL
  Filled 2023-09-20: qty 2
  Filled 2023-09-20 (×4): qty 1

## 2023-09-20 MED ORDER — ROPINIROLE HCL 0.5 MG PO TABS
0.5000 mg | ORAL_TABLET | Freq: Every day | ORAL | Status: DC
  Administered 2023-09-20 – 2023-09-23 (×4): 0.5 mg via ORAL
  Filled 2023-09-20 (×6): qty 1

## 2023-09-20 NOTE — ED Notes (Signed)
 CCMD called.

## 2023-09-20 NOTE — Assessment & Plan Note (Addendum)
A1c 9.3 on 11/15 and poorly controlled. Home regimen: Trulicity 0.75 mg weekly, glargine 30 units daily, empagliflozin 25 mg daily -Continue empagliflozin 25 mg daily -CBGs ACHS -mSSI -Adjust LAI based on CBGs when tolerating PO and off BiPAP

## 2023-09-20 NOTE — Telephone Encounter (Signed)
Pharmacy Patient Advocate Encounter  Received notification from  [employer]  that Prior Authorization for Chelsea [name] has been APPROVED from 09/20/2023 to 09/30/2098. Ran test claim, Copay is $0.00. This test claim was processed through [facility]- copay amounts may vary at other pharmacies due to pharmacy/plan contracts, or as the patient moves through the different stages of their insurance plan.   PA #/Case ID/Reference #: [id number]

## 2023-09-20 NOTE — Assessment & Plan Note (Addendum)
Currently in NSR.  Stable issue, no concern for contribution to heart failure or chest pain.  Ordering 10 mEq KCl IV x4. -Continue Eliquis 5 mg BID -Continue amiodarone 200 mg daily for rhythm control -AM BMP, Mag; goal K>4, Mag>2

## 2023-09-20 NOTE — Inpatient Diabetes Management (Signed)
Inpatient Diabetes Program Recommendations  AACE/ADA: New Consensus Statement on Inpatient Glycemic Control (2015)  Target Ranges:  Prepandial:   less than 140 mg/dL      Peak postprandial:   less than 180 mg/dL (1-2 hours)      Critically ill patients:  140 - 180 mg/dL    Latest Reference Range & Units 09/20/23 08:18 09/20/23 12:32  Glucose-Capillary 70 - 99 mg/dL 440 (H)  3 units Novolog  195 (H)  3 units Novolog   (H): Data is abnormally high    Admit with: Acute on chronic hypoxic respiratory failure   History: DM  Home DM Meds: Jardiance 25 mg daily        Trulicity 0.75 mg Qweek         Basaglar insulin 30 units daily  Current Orders: Novolog Moderate Correction Scale/ SSI (0-15 units) TID AC      Jardiance 25 mg daily    MD- May consider starting 1/3 total home dose basal insulin: Semglee 10 units daily   --Will follow patient during hospitalization--  Ambrose Finland RN, MSN, CDCES Diabetes Coordinator Inpatient Glycemic Control Team Team Pager: [phone] (8a-5p)

## 2023-09-20 NOTE — Plan of Care (Addendum)
FMTS Interim Progress Note  S: Patient sleeping but arousable to voice and able to answer questions appropriately on exam today.  She does not endorse any pain or complaints.  But does have a cough.  O: BP (!) 146/54   Pulse 89   Temp 99.1 F (37.3 C) (Axillary)   Resp 17   Ht 5\' 5"  (1.651 m)   Wt (!) 147.9 kg   SpO2 100%   BMI 54.25 kg/m   General: Asleep but arousable, obese appearing female no distress Cardiac: Loud systolic murmur, regular rate and rhythm Respiratory: Difficult to auscultate due to body habitus, no crackles or rails appreciated.  Patient takes shallow breaths. Abdomen: Soft, nontender Extremities: Bilateral pitting edema and redness most likely venous stasis.  No excess warmth compared to surrounding skin, no evidence of exudate.  A/P: Assessment & Plan Acute hypoxic on chronic hypercapnic respiratory failure (HCC) Off BiPAP as of this morning.  Continuing with oxygen support.  VBG ordered and pending. -Continuous pulse ox -Added on diet, added on p.o. meds -Discontinued BiPAP, wean to oxygen with O2 goal 88-92%, will need nightly BiPAP moving forward -VBG pending (HFpEF) heart failure with preserved ejection fraction (HCC) Difficult to evaluate volume status given patient's body habitus.  Redosed IV Lasix to 80 mg we will continue to monitor.  Patient has not yet urinated we will watch closely with bladder scans. -Continue spironolactone 25 mg daily, empagliflozin 25 mg daily -Gave dose of IV Lasix 80 mg today -Strict I&Os, daily weights -Continuous cardiac monitoring -Bladder scans every 8h, I&O cath >300 mL -Continue to hold oxybutynin CAP (community acquired pneumonia) Remains afebrile and without white count.  Cough increased today off BiPAP.  Continued Vanco in setting of venous stasis rather than cellulitis we will continue antibiotics as below. -CTX 1 g daily (day 2 of 5), azithromycin 500 mg daily (day 1 of 3) -Will need follow up imaging for  resolution after treatment -Monitor fever curve -Follow blood cultures  Gerrit Heck, DO 09/20/2023, 7:30 AM PGY-1, [facility] pager [phone]   Seen by Dr. Deirdre Priest.   FMTS Attending Daily Note: Terisa Starr, MD  Team Pager [phone] Pager [phone]  I have discussed this patient with the resident. I agree with the resident's findings, assessment and care plan.  In addition to below Lower extremity erythema- unlikely cellulitis, discontinue vancomycin.   Prolonged QTC- repeat EKG   To clarify above- acute on chronic heart failure, preserved EF .   Disposition: ongoing evaluation and treatment

## 2023-09-20 NOTE — ED Notes (Signed)
ED TO INPATIENT HANDOFF REPORT  ED Nurse Name and Phone #:   Chelsea [name] 57 y.o. female Room/Bed: 029C/029C  Code Status   Code Status: Full Code  Home/SNF/Other Skilled nursing facility Patient oriented to: self, place, time, and situation Is this baseline? Yes   Triage Complete: Triage complete  Chief Complaint Acute hypoxic on chronic hypercapnic respiratory failure (HCC) [J96.01, J96.12]  Triage Note Pt bib [facility] coming from [facility]. EMS was called out for low SpO2. Pt is COPD pt and pt was on 65% on [place] (She normally is at 88% on 4L baseline). Bilateral edema to lower extremities and hot to the touch. EMS put pt on NRB, bringing SpO2 to 98%. Pt is oriented. GCS 15.    EMS vital signs: 90 HR 98% NRB  342 cbg 28 RR 120/58    Allergies Allergies  Allergen Reactions   Iodinated Contrast Media Anaphylaxis   Penicillins Anaphylaxis    Patient tolerates cefepime (08/2017)   Insulin Lispro Other (See Comments)    Adder per MAR from SNF   Minoxidil Hives    Level of Care/Admitting Diagnosis ED Disposition     ED Disposition  Admit   Condition  --   Comment  Hospital Area: [facility] [[id number]]  Level of Care: Progressive [102]  Admit to Progressive based on following criteria: RESPIRATORY PROBLEMS hypoxemic/hypercapnic respiratory failure that is responsive to NIPPV (BiPAP) or High Flow Nasal Cannula (6-80 lpm). Frequent assessment/intervention, no > Q2 hrs < Q4 hrs, to maintain oxygenation and pulmonary hygiene.  May admit patient to [facility] or [facility] if equivalent level of care is available:: No  Covid Evaluation: Asymptomatic - no recent exposure (last 10 days) testing not required  Diagnosis: Acute hypoxic on chronic hypercapnic respiratory failure [facility]) [[id number]]  Admitting Physician: Nelia Shi [[id number]]  Attending Physician: Carney Living [phone]  Certification:: I  certify this patient will need inpatient services for at least 2 midnights          B Medical/Surgery History Past Medical History:  Diagnosis Date   Acquired hypothyroidism 11/13/2010   Qualifier: Diagnosis of   By: Jens Som, MD, Lyn Hollingshead      Acute congestive heart failure (HCC) 03/18/2021   Acute idiopathic gout of left hand 02/16/2019   Acute kidney injury (HCC) 05/29/2023   Acute on chronic diastolic CHF (congestive heart failure) (HCC) 09/25/2017   Acute on chronic diastolic CHF (congestive heart failure), NYHA class 3 (HCC) 09/25/2017   Acute on chronic hypoxic respiratory failure (HCC) 07/04/2022   Acute on chronic respiratory failure with hypoxia (HCC) 09/17/2022   Acute on chronic respiratory failure with hypoxia and hypercapnia (HCC) 04/07/2022   Acute respiratory failure with hypoxia and hypercarbia (HCC) 05/27/2016   Arthritis    "qwhere" (12/05/2017)   Asthma    Atrial flutter (HCC) 03/09/2021   Bleeding of the respiratory tract 04/07/2022   Bronchitis 05/30/2011   Cervical cancer (HCC) 2006   CHF (congestive heart failure) (HCC)    CHF exacerbation (HCC) 07/30/2022   Chronic diastolic heart failure (HCC) 11/14/2010      11/2010 322 lbs      Chronic heart failure with preserved ejection fraction (HCC) 02/23/2018   Chronic lower back pain    Chronic respiratory failure with hypoxia (HCC) 07/02/2018   ABG 11/2017 7.36/69   Consistent with obesity hypoventilation syndrome   Complication of anesthesia    "I have a hard time waking  up from under it" (12/05/2017)   COPD (chronic obstructive pulmonary disease) (HCC) 03/04/2015   Coronary artery disease    Cough productive of clear sputum 06/03/2023   Diarrhea 06/18/2019   Diastolic congestive heart failure (HCC)    Echo 02/21/11: EF 55-60%, moderate LVH, grade 2 diastolic dysfunction, mild to moderate aortic stenosis with a mean gradient 23 mm of mercury   DJD (degenerative joint disease) 07/03/2021   DM2  (diabetes mellitus, type 2) (HCC) 11/10/2010   Dyspnea 12/05/2017   Dyspnea on exertion 02/09/2021   Edema 03/22/2011   Essential (primary) hypertension 11/10/2010   Formatting of this note might be different from the original.  Overview:   Qualifier: Diagnosis of   By: Kem Parkinson      Last Assessment & Plan:   Watch blood pressure off of minoxidil and add medications as needed.   Essential hypertension 11/10/2010   Qualifier: Diagnosis of   By: Kem Parkinson       Gastro-esophageal reflux disease without esophagitis 11/10/2010   GERD 11/10/2010   Qualifier: Diagnosis of   By: Kem Parkinson       Hair loss 09/17/2019   Heart failure (HCC) 04/20/2021   Heart murmur    Hepatitis C    History of cervical cancer 03/22/2011   History of gout X 1   History of tobacco use 04/20/2021   Hx of atrial flutter 03/15/2021   Hyperglycemia due to type 2 diabetes mellitus (HCC) 04/20/2021   Hyperlipidemia    Hypertension    Hypertension associated with diabetes (HCC) 08/25/2018   Irritable bowel syndrome with constipation 04/20/2021   Irritable bowel syndrome with diarrhea 06/18/2019   Limitation of activity due to disability 09/18/2021   Lymphedema of both lower extremities 03/22/2011   Metabolic alkalosis 03/28/2021   Microalbuminuria 04/20/2021   Migraine    "monthly" (12/05/2017)   Mild tricuspid regurgitation 04/02/2021   Mitral valve sclerosis 04/02/2021   Mixed hyperlipidemia 04/18/2020   Moderate aortic stenosis 04/20/2021   Mononeuropathy of lower extremity 04/09/2011   Formatting of this note might be different from the original.  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  Severe, corrected by CPAP 18 with C-flex of 3  2/13 bipap 20/14 sm ff mask h/h biflex+3cm  Formatting of this note might be different from the original.  Sleep study 06/2020: IMPRESSIONS  - Moderate obstructive sleep apnea occurred during this study (AHI = 17.7/h), mostly REM related  - No significant central sleep apnea occurred during   On home oxygen therapy    "2L all the time" (12/05/2017)   On supplemental oxygen by nasal cannula 04/20/2021   Onychomycosis 06/18/2019   OSA treated with BiPAP    "have CPAP at home too; wearing BiPAP right now" (12/05/2017)   Osteoarthritis 11/28/2010   Qualifier: Diagnosis of  By: Brynda Rim       Other long term (current) drug therapy 02/12/2022   Paroxysmal atrial fibrillation (HCC) 09/04/2021   Physical debility 05/10/2018   Formatting of this note might be different from the original.  Due to morbid obesity   Physical deconditioning 04/24/2021   Pneumonia    "several times" (12/05/2017)   Pneumonia due to gram-positive bacteria 08/24/2017   Pressure injury of both heels, unstageable (HCC) 07/03/2021   Pulmonary edema, acute (HCC) 02/21/2022   Renal insufficiency 04/20/2021   Restless leg syndrome 03/22/2021   Restrictive lung disease secondary to obesity 01/28/2022   Chelsea/P hysterectomy 02/19/2021   Tinea pedis of both feet 11/12/2018   Unspecified atrial flutter (HCC) 02/12/2022   Upper GI bleed 07/18/2022   Venous insufficiency of both lower extremities 07/02/2021   Weakness 03/20/2021   Weight loss 03/15/2021   Past Surgical History:  Procedure Laterality Date   ABDOMINAL  SURGERY  2006 X 2   "TAH incision was infected"   BIOPSY  07/21/2022   Procedure: BIOPSY;  Surgeon: Kathi Der, MD;  Location: [facility];  Service: Gastroenterology;;   CHOLECYSTECTOMY OPEN     ESOPHAGOGASTRODUODENOSCOPY N/A 07/21/2022   Procedure: ESOPHAGOGASTRODUODENOSCOPY (EGD);  Surgeon: Kathi Der, MD;  Location: [facility];  Service: Gastroenterology;  Laterality: N/A;   TONSILLECTOMY     TOTAL ABDOMINAL HYSTERECTOMY  2006   TRACHEOSTOMY REVISION N/A 04/09/2022   Procedure: CONTROL OF BLEEDING,TRACHEOSTOMY SITE;  Surgeon: Christia Reading, MD;  Location: [facility] OR;  Service: ENT;  Laterality: N/A;     A IV Location/Drains/Wounds Patient Lines/Drains/Airways Status     Active Line/Drains/Airways     Name Placement date Placement time Site Days   Peripheral IV 09/19/23 20 G 1.88" Anterior;Proximal;Right Forearm 09/19/23  1755  Forearm  1   External Urinary Catheter 09/20/23  0228  --  less than 1            Intake/Output Last 24 hours  Intake/Output Summary (Last 24 hours) at 09/20/2023 1036 Last data filed at 09/20/2023 0549 Gross per 24 hour  Intake 1500 ml  Output 1450 ml  Net 50 ml    Labs/Imaging Results for orders placed or performed during the hospital encounter of 09/19/23 (from the past 48 hours)  Comprehensive metabolic panel     Status: Abnormal   Collection Time: 09/19/23  5:53 PM  Result Value Ref Range   Sodium 141 135 - 145 mmol/L   Potassium 4.0 3.5 - 5.1 mmol/L   Chloride 90 (L) 98 - 111 mmol/L   CO2 38 (H) 22 - 32 mmol/L   Glucose, Bld 259 (H) 70 - 99 mg/dL    Comment: Glucose reference range applies only to samples taken after fasting for at least 8 hours.   BUN 23 (H) 6 - 20 mg/dL   Creatinine, Ser 1.61 (H) 0.44 - 1.00 mg/dL   Calcium 8.5 (L) 8.9 - 10.3 mg/dL   Total Protein 7.0 6.5 - 8.1 g/dL   Albumin 2.8 (L) 3.5 - 5.0 g/dL   AST 13 (L) 15 - 41 U/L   ALT 11 0 - 44 U/L   Alkaline Phosphatase 106 38 - 126 U/L   Total  Bilirubin 0.6 <1.2 mg/dL   GFR, Estimated 59 (L) >60 mL/min    Comment: (NOTE) Calculated using the CKD-EPI Creatinine Equation (2021)    Anion gap 13 5 - 15    Comment: Performed at [facility], [address], [place],  [place] [address]  CBC with Differential     Status: Abnormal   Collection Time: 09/19/23  5:53 PM  Result Value Ref Range   WBC 9.9 4.0 - 10.5 K/uL   RBC 4.05 3.87 - 5.11 MIL/uL   Hemoglobin 11.0 (L) 12.0 - 15.0 g/dL   HCT 84.1 32.4 - 40.1 %   MCV 93.3 80.0 - 100.0 fL   MCH 27.2 26.0 - 34.0 pg   MCHC 29.1 (L) 30.0 - 36.0 g/dL   RDW 02.7 25.3 - 66.4 %   Platelets 236 150 - 400 K/uL   nRBC 0.0 0.0 - 0.2 %   Neutrophils Relative % 75 %   Neutro Abs 7.5 1.7 - 7.7 K/uL   Lymphocytes Relative 14 %   Lymphs Abs 1.3 0.7 - 4.0 K/uL   Monocytes Relative 9 %   Monocytes Absolute 0.9 0.1 - 1.0 K/uL   Eosinophils Relative 1 %   Eosinophils Absolute 0.1 0.0 - 0.5 K/uL   Basophils Relative 0 %   Basophils Absolute 0.0 0.0 - 0.1 K/uL   Immature Granulocytes 1 %   Abs Immature Granulocytes 0.05 0.00 - 0.07 K/uL    Comment: Performed at [facility], [address] [place], [place] [address]  Protime-INR     Status: Abnormal   Collection Time: 09/19/23  5:53 PM  Result Value Ref Range   Prothrombin Time 18.3 (H) 11.4 - 15.2 seconds   INR 1.5 (H) 0.8 - 1.2    Comment: (NOTE) INR goal varies based on device and disease states. Performed at [facility], [address] [place], [place] [address]   APTT     Status: Abnormal   Collection Time: 09/19/23  5:53 PM  Result Value Ref Range   aPTT 38 (H) 24 - 36 seconds    Comment:        IF BASELINE aPTT IS ELEVATED, SUGGEST PATIENT RISK ASSESSMENT BE USED TO DETERMINE APPROPRIATE ANTICOAGULANT THERAPY. Performed at [facility], [address] [place], [place] [address]   Blood Culture (routine x 2)     Status: None (Preliminary result)   Collection Time: 09/19/23  5:53 PM   Specimen: BLOOD  RIGHT ARM  Result Value Ref Range   Specimen Description BLOOD RIGHT ARM    Special Requests      BOTTLES DRAWN AEROBIC AND ANAEROBIC Blood Culture results may not be optimal due to an inadequate volume of blood received in culture bottles   Culture      NO GROWTH < 12 HOURS Performed at [facility], [address] [place], [place] [address]    Report Status PENDING   Brain natriuretic peptide     Status: Abnormal   Collection Time: 09/19/23  5:53 PM  Result Value Ref Range   B Natriuretic Peptide 236.6 (H) 0.0 - 100.0 pg/mL    Comment: Performed at [facility], [address], [place], [place] [address]  I-Stat Chem 8, ED     Status: Abnormal   Collection Time: 09/19/23  6:09 PM  Result Value Ref Range   Sodium 138 135 - 145 mmol/L   Potassium 4.0 3.5 - 5.1 mmol/L   Chloride 89 (L) 98 - 111 mmol/L   BUN 32 (H) 6 - 20 mg/dL   Creatinine, Ser 4.16 (H) 0.44 - 1.00 mg/dL   Glucose, Bld 606 (H) 70 - 99 mg/dL    Comment: Glucose reference range applies only to  samples taken after fasting for at least 8 hours.   Calcium, Ion 0.97 (L) 1.15 - 1.40 mmol/L   TCO2 45 (H) 22 - 32 mmol/L   Hemoglobin 12.2 12.0 - 15.0 g/dL   HCT 16.1 09.6 - 04.5 %  I-Stat Lactic Acid, ED     Status: None   Collection Time: 09/19/23  6:10 PM  Result Value Ref Range   Lactic Acid, Venous 1.4 0.5 - 1.9 mmol/L  Blood gas, venous     Status: Abnormal   Collection Time: 09/20/23 12:39 AM  Result Value Ref Range   pH, Ven 7.34 7.25 - 7.43   pCO2, Ven 88 (HH) 44 - 60 mmHg    Comment: CRITICAL RESULT CALLED TO, READ BACK BY AND VERIFIED WITH: Franki Monte, RN @0053  09/20/23 SATRAINR    pO2, Ven 100 (H) 32 - 45 mmHg   Bicarbonate 47.5 (H) 20.0 - 28.0 mmol/L   Acid-Base Excess 17.1 (H) 0.0 - 2.0 mmol/L   O2 Saturation 98 %   Patient temperature 37.0     Comment: Performed at [facility], [address], [place], [place] [address]  Troponin I (High Sensitivity)     Status: None    Collection Time: 09/20/23 12:42 AM  Result Value Ref Range   Troponin I (High Sensitivity) 8 <18 ng/L    Comment: (NOTE) Elevated high sensitivity troponin I (hsTnI) values and significant  changes across serial measurements may suggest ACS but many other  chronic and acute conditions are known to elevate hsTnI results.  Refer to the "Links" section for chest pain algorithms and additional  guidance. Performed at [facility], [address], [place], [place] [address]   Basic metabolic panel     Status: Abnormal   Collection Time: 09/20/23 12:42 AM  Result Value Ref Range   Sodium 144 135 - 145 mmol/L   Potassium 3.7 3.5 - 5.1 mmol/L   Chloride 94 (L) 98 - 111 mmol/L   CO2 41 (H) 22 - 32 mmol/L   Glucose, Bld 223 (H) 70 - 99 mg/dL    Comment: Glucose reference range applies only to samples taken after fasting for at least 8 hours.   BUN 22 (H) 6 - 20 mg/dL   Creatinine, Ser 8.29 0.44 - 1.00 mg/dL   Calcium 8.5 (L) 8.9 - 10.3 mg/dL   GFR, Estimated >56 >21 mL/min    Comment: (NOTE) Calculated using the CKD-EPI Creatinine Equation (2021)    Anion gap 9 5 - 15    Comment: Performed at [facility], [address] [place], [place] [address]  CBC     Status: Abnormal   Collection Time: 09/20/23 12:42 AM  Result Value Ref Range   WBC 9.0 4.0 - 10.5 K/uL   RBC 3.87 3.87 - 5.11 MIL/uL   Hemoglobin 10.4 (L) 12.0 - 15.0 g/dL   HCT 78.4 69.6 - 29.5 %   MCV 93.3 80.0 - 100.0 fL   MCH 26.9 26.0 - 34.0 pg   MCHC 28.8 (L) 30.0 - 36.0 g/dL   RDW 28.4 13.2 - 44.0 %   Platelets 222 150 - 400 K/uL   nRBC 0.0 0.0 - 0.2 %    Comment: Performed at [facility], [address], [place], [place] [address]  Magnesium     Status: None   Collection Time: 09/20/23 12:42 AM  Result Value Ref Range   Magnesium 2.1 1.7 - 2.4 mg/dL  Comment: Performed at [facility], [address], [place], [place] [address]  Troponin I (High Sensitivity)     Status: None   Collection  Time: 09/20/23  3:32 AM  Result Value Ref Range   Troponin I (High Sensitivity) 8 <18 ng/L    Comment: (NOTE) Elevated high sensitivity troponin I (hsTnI) values and significant  changes across serial measurements may suggest ACS but many other  chronic and acute conditions are known to elevate hsTnI results.  Refer to the "Links" section for chest pain algorithms and additional  guidance. Performed at [facility], [address], [place], [place] [address]   CBG monitoring, ED     Status: Abnormal   Collection Time: 09/20/23  8:18 AM  Result Value Ref Range   Glucose-Capillary 191 (H) 70 - 99 mg/dL    Comment: Glucose reference range applies only to samples taken after fasting for at least 8 hours.   CT CHEST WO CONTRAST Result Date: 09/20/2023 CLINICAL DATA:  Pneumonia complication suspected.  Possible sepsis. EXAM: CT CHEST WITHOUT CONTRAST TECHNIQUE: Multidetector CT imaging of the chest was performed following the standard protocol without IV contrast. RADIATION DOSE REDUCTION: This exam was performed according to the departmental dose-optimization program which includes automated exposure control, adjustment of the mA and/or kV according to patient size and/or use of iterative reconstruction technique. COMPARISON:  Same day chest radiograph and CT chest 06/14/2023 FINDINGS: Cardiovascular: Cardiomegaly. No pericardial effusion. Calcification of anterior superior pericardium. Coronary artery and aortic atherosclerotic calcification. Enlarged main pulmonary artery measuring 41 mm. Mediastinum/Nodes: Unchanged mediastinal and hilar lymphadenopathy. For example 1.7 cm precarinal node is unchanged using similar measuring technique. Trachea and esophagus are unremarkable. Lungs/Pleura: Bilateral geographic ground-glass and consolidative airspace opacities greatest in the lower lobes. Small left pleural effusion. Interlobular septal thickening. No pneumothorax. Upper Abdomen: No acute  abnormality. Musculoskeletal: No fracture. IMPRESSION: 1. Bilateral geographic ground-glass and consolidative airspace opacities greatest in the lower lobes. Findings are concerning for multifocal pneumonia. Pulmonary edema could appear similarly. Follow-up is recommended to ensure resolution. 2. Small left pleural effusion. 3. Enlarged main pulmonary artery compatible with pulmonary arterial hypertension. Aortic Atherosclerosis (ICD10-I70.0). Electronically Signed   By: Minerva Fester M.D.   On: 09/20/2023 00:14   DG Chest Port 1 View Result Date: 09/19/2023 CLINICAL DATA:  Possible sepsis. EXAM: PORTABLE CHEST 1 VIEW COMPARISON:  08/15/2023, 06/14/2023. FINDINGS: The heart is enlarged and the mediastinal contour stable. Patchy airspace disease is noted in the mid to lower lung fields bilaterally. No definite effusion or pneumothorax is seen. No acute osseous abnormality. IMPRESSION: 1. Patchy airspace disease in the mid to lower lung fields, possible atelectasis or infiltrate. 2. Cardiomegaly. Electronically Signed   By: Thornell Sartorius M.D.   On: 09/19/2023 21:17    Pending Labs Unresulted Labs (From admission, onward)     Start     Ordered   09/21/23 0500  Basic metabolic panel  Tomorrow morning,   R        09/20/23 1020   09/21/23 0500  CBC  Tomorrow morning,   R        09/20/23 1020   09/20/23 0938  Basic metabolic panel  Once,   R        09/20/23 0937   09/20/23 0938  CBC  Once,   R        09/20/23 0937   09/20/23 0936  Blood gas, venous  Once,   R  09/20/23 0937   09/19/23 1742  Resp panel by RT-PCR (RSV, Flu A&B, Covid) Anterior Nasal Swab  (Septic presentation on arrival (screening labs, nursing and treatment orders for obvious sepsis))  Once,   URGENT        09/19/23 1742   09/19/23 1742  Blood Culture (routine x 2)  (Septic presentation on arrival (screening labs, nursing and treatment orders for obvious sepsis))  BLOOD CULTURE X 2,   STAT      09/19/23 1742             Vitals/Pain Today'Chelsea Vitals   09/20/23 0830 09/20/23 0845 09/20/23 0900 09/20/23 0915  BP: 122/74  131/77   Pulse: 91 86 84 88  Resp: 14 (!) 24 (!) 27 (!) 24  Temp:      TempSrc:      SpO2: 93% 96% 95% 95%  Weight:      Height:      PainSc:        Isolation Precautions No active isolations  Medications Medications  amiodarone (PACERONE) tablet 200 mg (200 mg Oral Given 09/20/23 0926)  atorvastatin (LIPITOR) tablet 20 mg (has no administration in time range)  spironolactone (ALDACTONE) tablet 25 mg (25 mg Oral Given 09/20/23 0926)  levothyroxine (SYNTHROID) tablet 150 mcg (0 mcg Oral Hold 09/20/23 0512)  empagliflozin (JARDIANCE) tablet 25 mg (has no administration in time range)  pantoprazole (PROTONIX) EC tablet 40 mg (40 mg Oral Given 09/20/23 0926)  ferrous sulfate tablet 325 mg (325 mg Oral Given 09/20/23 0926)  apixaban (ELIQUIS) tablet 5 mg (5 mg Oral Given 09/20/23 0926)  cholecalciferol (VITAMIN D3) 25 MCG (1000 UNIT) tablet 5,000 Units (5,000 Units Oral Given 09/20/23 0926)  rOPINIRole (REQUIP) tablet 0.5 mg (0 mg Oral Hold 09/20/23 0214)  budesonide (PULMICORT) nebulizer solution 0.5 mg (0.5 mg Inhalation Given 09/20/23 0800)  montelukast (SINGULAIR) tablet 10 mg (0 mg Oral Hold 09/20/23 0213)  buPROPion ER (WELLBUTRIN SR) 12 hr tablet 100 mg (has no administration in time range)  sodium chloride flush (NS) 0.9 % injection 3 mL (3 mLs Intravenous Not Given 09/20/23 0928)  ipratropium-albuterol (DUONEB) 0.5-2.5 (3) MG/3ML nebulizer solution 3 mL (has no administration in time range)  cefTRIAXone (ROCEPHIN) 1 g in sodium chloride 0.9 % 100 mL IVPB (0 g Intravenous Stopped 09/20/23 0547)  azithromycin (ZITHROMAX) 500 mg in sodium chloride 0.9 % 250 mL IVPB (0 mg Intravenous Stopped 09/20/23 0342)  insulin aspart (novoLOG) injection 0-15 Units (3 Units Subcutaneous Given 09/20/23 0848)  acetaminophen (TYLENOL) tablet 650 mg (650 mg Oral Given 09/20/23 0925)   furosemide (LASIX) injection 80 mg (has no administration in time range)  sodium chloride 0.9 % bolus 1,000 mL (0 mLs Intravenous Stopped 09/19/23 1915)  ceFEPIme (MAXIPIME) 2 g in sodium chloride 0.9 % 100 mL IVPB (0 g Intravenous Stopped 09/19/23 1908)  Vancomycin (VANCOCIN) 1,500 mg in sodium chloride 0.9 % 500 mL IVPB (0 mg Intravenous Stopped 09/19/23 2255)    Followed by  vancomycin (VANCOCIN) IVPB 1000 mg/200 mL premix (0 mg Intravenous Stopped 09/19/23 2255)  furosemide (LASIX) injection 40 mg (40 mg Intravenous Given 09/20/23 0234)  potassium chloride 10 mEq in 100 mL IVPB (0 mEq Intravenous Stopped 09/20/23 0754)    Mobility non-ambulatory     Focused Assessments    R Recommendations: See Admitting Provider Note  Report given to:   Additional Notes:

## 2023-09-20 NOTE — ED Notes (Signed)
Pt refusing in/out catheter. Pt educated on importance of need for urine collection, however pt still refusing. PureWick in place.

## 2023-09-20 NOTE — Assessment & Plan Note (Signed)
Difficult to evaluate volume status given patient's body habitus.  Redosed IV Lasix to 80 mg we will continue to monitor.  Patient has not yet urinated we will watch closely with bladder scans. -Continue spironolactone 25 mg daily, empagliflozin 25 mg daily -Gave dose of IV Lasix 80 mg today -Strict I&Os, daily weights -Continuous cardiac monitoring -Bladder scans every 8h, I&O cath >300 mL -Continue to hold oxybutynin

## 2023-09-20 NOTE — Assessment & Plan Note (Addendum)
CT chest with bilateral geographic groundglass and consolidative airspace opacity, greatest in lower lobes concerning for multifocal pneumonia though pulmonary edema could appear similarly.  No leukocytosis, afebrile, lactate WNL.  May have precipitated acute respiratory failure, will treat empirically for CAP.  S/p cefepime and vancomycin in ED yesterday, discussed antibiotics choice and timing with pharmacy overnight. -CTX 1 g daily (day 2 of 5), azithromycin 500 mg daily (day 1 of 3) -Will need follow up imaging for resolution after treatment -Monitor fever curve -Follow blood cultures

## 2023-09-20 NOTE — Progress Notes (Signed)
RT was called to patient room due to patient having sats in the 70s upon arrival to room.  Upon RT arrival, patient was noted to have sats of 98% on 15L [place] and lethargic.  Patient is able to wake up to answer questions, and follow commands, however goes right back to sleep.  Per patient, she wears a mask at night when sleeping.  Placed patient on bipap and is tolerating well at this time.  RN to inform MD.  Will continue to monitor.

## 2023-09-20 NOTE — Assessment & Plan Note (Addendum)
Pharmacy tech noted oxybutynin 2.5 mg BID on med rec. -Hold oxybutynin for now given centrally acting off target effects in setting of somnolence -Bladder scans Q8h, I&O cath >300 mL

## 2023-09-20 NOTE — Assessment & Plan Note (Addendum)
Somnolent, but able to follow instructions, converse, and respond to questions appropriately while on BiPAP.  VBG showing pCO2 88 with bicarb 47.5, similar to prior hospitalization.  Etiology likely multifactorial with minimal respiratory reserve at baseline d/t underlying OSA/OHS, but considering CHF exacerbation versus PNA. -Admit to FMTS, attending Dr. Deirdre Priest, Progressive unit -Continuous pulse ox -NPO and hold PO meds until somnolence improves and off BiPAP -Continue BiPAP, wean to oxygen with O2 goal 88-92%, will need nightly BiPAP moving forward -Consider repeat VBG in AM to determine if BiPAP can be discontinued, will also observe mental status

## 2023-09-20 NOTE — ED Notes (Signed)
Pt repeatedly taking blood pressure cuff off. This RN explained to patient importance of blood pressure monitoring.

## 2023-09-20 NOTE — Assessment & Plan Note (Addendum)
Chronic issue.  Do not favor acute exacerbation given that cough is nonproductive.  Minimal wheezing in lungs. -Continue home budesonide nebulizer BID and montelukast 10 mg daily -DuoNebs Q4h PRN for wheezing -O2 goal 88-92%

## 2023-09-20 NOTE — ED Notes (Signed)
PO medications held as patient is on BiPap and lethargic. Message send to MD regarding medication route.

## 2023-09-20 NOTE — Telephone Encounter (Signed)
Pharmacy Patient Advocate Encounter   Received notification from  Inpatient  that prior authorization for [facility] 2.5MG /dose is required/requested.   Insurance verification completed.   The patient is insured through  [employer]  .   Per test claim: PA required; PA submitted to above mentioned insurance via CoverMyMeds Key/confirmation #/EOC [id number] Status is pending

## 2023-09-20 NOTE — ED Notes (Signed)
Dr. Hernandez at bedside.

## 2023-09-20 NOTE — Plan of Care (Signed)
FMTS Interim Progress Note  Patient assessed at bedside after reviewing our team note stating to 70s requiring her to be put back on BiPAP.  RN present in the room, confirmed patient went red in the face and desatted upon her arrival to the room.  Patient initially difficult to arouse requiring painful stimuli.  Once awake appropriately responding to orientation questions and engaging conversation.  Some agitation.  O: BP 105/60 (BP Location: Right Wrist)   Pulse 82   Temp 99.3 F (37.4 C) (Axillary)   Resp (!) 23   Ht 5\' 5"  (1.651 m)   Wt (!) 147.9 kg   SpO2 95%   BMI 54.25 kg/m    General: Sleepy but arousable to painful stimuli, NAD Resp: BiPAP in place.  Oxygen saturation 94%. Neuro: A&Ox4.  No focal deficit.  A/P: Acute on chronic hypoxic respiratory failure Now back on BiPAP, appropriate oxygen saturation in no acute distress.  Some somnolence initially that resolved throughout exam and patient responded appropriately.  Continue current course of action and will wean BiPAP as tolerated.   [name], MD 09/20/2023, 12:38 PM PGY-2, [facility] Service pager [phone]

## 2023-09-20 NOTE — Plan of Care (Signed)

## 2023-09-20 NOTE — Progress Notes (Addendum)
Pharmacy Antibiotic Note  Chelsea [name] is a 57 y.o. female admitted on 09/19/2023 with low oxygenation and bilateral lower extremity edema.  Pharmacy has been consulted for Vancomycin dosing for cellulitis.  -WBC 9.9, sCr 1.10, afebrile -Ceftriaxone and Vanc in ED -Blood cultures collected  Plan: -Ceftriaxone 1g IV every 24 hours per MD -Azithromycin 500mg  IV every 24 hours per MD -Vancomycin 2.5g IV x1 -Vancomycin 750mg  IV every 12 hours (AUC 443, Vd 0.5, IBW) -Monitor renal function -Follow up signs of clinical improvement, LOT, de-escalation of antibiotics      Temp (24hrs), Avg:99.2 F (37.3 C), Min:98.8 F (37.1 C), Max:99.5 F (37.5 C)  Recent Labs  Lab 09/19/23 1753 09/19/23 1809 09/19/23 1810  WBC 9.9  --   --   CREATININE 1.08* 1.10*  --   LATICACIDVEN  --   --  1.4    CrCl cannot be calculated (Unknown ideal weight.).    Allergies  Allergen Reactions   Iodinated Contrast Media Anaphylaxis   Penicillins Anaphylaxis    Patient tolerates cefepime (08/2017)   Insulin Lispro Other (See Comments)    Adder per [facility] from SNF   Minoxidil Hives    Antimicrobials this admission: Ceftriaxone 12/19 >>  Vancomycin 12/19 >>   Microbiology results: 12/19 BCx:   Thank you for allowing pharmacy to be a part of this patient's care.  Arabella Merles, PharmD. Clinical Pharmacist 09/20/2023 1:18 AM

## 2023-09-20 NOTE — ED Notes (Signed)
RT at bedside; pt placed on HFNC

## 2023-09-20 NOTE — ED Notes (Signed)
Dr. Erin Hearing at bedside

## 2023-09-20 NOTE — Assessment & Plan Note (Addendum)
EF 60-65% in August 2024.  Difficult to evaluate volume status, but some concern for pulmonary edema on imaging and with elevated BNP.  Will diurese and follow output to determine dosing. Home regimen: Torsemide 60 mg BID, spironolactone 25 mg daily, empagliflozin 25 mg daily -Continue spironolactone 25 mg daily, empagliflozin 25 mg daily -Hold oral torsemide and dose Lasix 40 mg IV now, redose in AM -Strict I&Os, daily weights -Consider HF consult given pattern of previous admissions -Continuous cardiac monitoring

## 2023-09-20 NOTE — Assessment & Plan Note (Addendum)
Likely pleuritic, may be related to PNA.  Tropoin WNL and EKG showing NSR without ischemic changes, no concern for ACS.  Considered PE, though less likely given compliance with Eliquis BID.  No tachycardia and Wells score 1.5. -Consider V/Q scan in AM given contrast allergy contraindicating CTA chest -Continuous cardiac monitoring

## 2023-09-20 NOTE — ED Notes (Signed)
Patient cleaned up and changed into new draw sheet and chux. RT and RN is at bedside at this time.

## 2023-09-20 NOTE — Assessment & Plan Note (Addendum)
Stable relative to baseline.  Likely IDA with ferritin 29 in August 2024. -Continue ferrous sulfate 325 mg daily -AM CBC

## 2023-09-20 NOTE — ED Notes (Signed)
Dr. Hyacinth Meeker at bedside; okay to give PO meds at this time

## 2023-09-20 NOTE — ED Notes (Signed)
Per Dr. Deirdre Priest, okay to trial off bipap; RT notified; pt placed on 6L [place] per PT; sats 88-90%; NAD; RT notified, will switch to HFNC

## 2023-09-20 NOTE — ED Notes (Signed)
Difficulty with blood draw for second set of blood cultures. MD/EDP notified of situation.

## 2023-09-20 NOTE — Plan of Care (Signed)
FMTS Brief Progress Note  S: To bedside for night rounds.  Patient and leveled at bedside.  Patient awake, alert, able to answer questions.  She denies any concerns at this time.  She is on nasal cannula currently and planning to eat soon but agrees to use BiPAP at night.   O: BP 132/83 (BP Location: Left Wrist)   Pulse 82   Temp 98.4 F (36.9 C) (Oral)   Resp (!) 30   Ht 5\' 5"  (1.651 m)   Wt (!) 147.9 kg   SpO2 95%   BMI 54.25 kg/m    General: NAD, awake, alert, appropriately responsive Respiratory: No respiratory distress, on supplemental O2 10L [place]   A/P:  Acute on chronic hypoxic respiratory failure Stable -BiPAP tonight -f/u VBG  Urinary retention Discussed with RN at bedside; most recent PVR of 0  -continue bladder scans q8h, in&out for PVR >329mL  Remainder per today's progress note  - Orders reviewed. Labs for AM ordered, which was adjusted as needed.  - If condition changes, plan includes page primary team.   Vonna Drafts, MD 09/20/2023, 10:11 PM PGY-2, [facility] Night Resident  Please page [phone] with questions.

## 2023-09-20 NOTE — ED Notes (Signed)
Patient refusing ABG at this time. Patient educated on importance of blood draw, however patient still refusing. EDP aware of situation/refusal.

## 2023-09-20 NOTE — Plan of Care (Signed)
Patient able to tolerate nasal cannula at 10L. Progressed to heart and healthy diet.

## 2023-09-20 NOTE — Assessment & Plan Note (Addendum)
BLE with chronic edema and now new rubor and calor but afebrile and without leukocytosis.  Concerned for cellulitis or impetigo with possibility of MRSA and will cover with vancomycin.  S/p vancomycin bolus load in ED, discussed antibiotics choice and timing with pharmacy overnight. -Vancomycin IV per pharmacy dosing for beta-hemolytic strep and MRSA coverage -Consider switching to daptomycin when tolerating PO Abx -Consider Wound Care consult if concerned for progression or weeping, skin surface disturbance -AM CBC

## 2023-09-20 NOTE — Hospital Course (Addendum)
Chelsea [name] is a 57 y.o. year old with a history of chronic hypercapnia, hypoxic respiratory failure likely secondary to OSA OHS and COPD HFpEF, type 2 diabetes, a flutter on Eliquis, hypertension and hyperlipidemia who presented with respiratory failure requiring BiPAP and was admitted to the [facility] Teaching service for acute hypoxic respiratory failure.  Acute hypoxic respiratory failure Patient was somnolent but able to converse on admission.  VBG showed pCO2 of 88.  Patient likely hypercapnic secondary to underlying OSA/OHS and heart failure.  Patient able to wean off BiPAP and oxygen weaned to***L.  Patient's mentation improved and repeat VBG showed ***.  Patient does have history of HFpEF, likely has component of acute CHF excebration contributing to clinical presentation.  Unable to tell volume status, diuresis was started with IV Lasix.  Patient did have an increase in cough and CT showed findings concerning for multifocal pneumonia.  Patient was started on ceftriaxone and azithromycin on 12/20. Antibiotic course as below.   Antibiotics course Vancomycin 12/19 - 12/20 Cefepime 12/19 CTX 12/20 - ***12/23 Azithromycin 12/20 - 12/22***  Lower extremity erythema Patient has bilateral lower extremity erythema, there was some concern for cellulitis so vancomycin was started.  On upon further investigation, most likely venous stasis so vancomycin was discontinued.   Other chronic conditions were medically managed with home medications and formulary alternatives as necessary (T2DM, HLD, RLS, GERD, hypothyroidism).  PCP Follow-up Recommendations: Follow-up imaging after completion of antibiotics for resolution of pneumonia

## 2023-09-20 NOTE — Assessment & Plan Note (Signed)
Remains afebrile and without white count.  Cough increased today off BiPAP.  Continued Vanco in setting of venous stasis rather than cellulitis we will continue antibiotics as below. -CTX 1 g daily (day 2 of 5), azithromycin 500 mg daily (day 1 of 3) -Will need follow up imaging for resolution after treatment -Monitor fever curve -Follow blood cultures

## 2023-09-20 NOTE — Assessment & Plan Note (Signed)
Off BiPAP as of this morning.  Continuing with oxygen support.  VBG ordered and pending. -Continuous pulse ox -Added on diet, added on p.o. meds -Discontinued BiPAP, wean to oxygen with O2 goal 88-92%, will need nightly BiPAP moving forward -VBG pending

## 2023-09-21 DIAGNOSIS — J9601 Acute respiratory failure with hypoxia: Secondary | ICD-10-CM | POA: Diagnosis not present

## 2023-09-21 DIAGNOSIS — J449 Chronic obstructive pulmonary disease, unspecified: Secondary | ICD-10-CM | POA: Diagnosis not present

## 2023-09-21 DIAGNOSIS — I5033 Acute on chronic diastolic (congestive) heart failure: Secondary | ICD-10-CM | POA: Diagnosis not present

## 2023-09-21 DIAGNOSIS — J9612 Chronic respiratory failure with hypercapnia: Secondary | ICD-10-CM | POA: Diagnosis not present

## 2023-09-21 LAB — CBC
HCT: 35.4 % — ABNORMAL LOW (ref 36.0–46.0)
Hemoglobin: 10.4 g/dL — ABNORMAL LOW (ref 12.0–15.0)
MCH: 27.2 pg (ref 26.0–34.0)
MCHC: 29.4 g/dL — ABNORMAL LOW (ref 30.0–36.0)
MCV: 92.7 fL (ref 80.0–100.0)
Platelets: 224 10*3/uL (ref 150–400)
RBC: 3.82 MIL/uL — ABNORMAL LOW (ref 3.87–5.11)
RDW: 15.4 % (ref 11.5–15.5)
WBC: 7.7 10*3/uL (ref 4.0–10.5)
nRBC: 0 % (ref 0.0–0.2)

## 2023-09-21 LAB — BASIC METABOLIC PANEL
Anion gap: 5 (ref 5–15)
BUN: 18 mg/dL (ref 6–20)
CO2: 40 mmol/L — ABNORMAL HIGH (ref 22–32)
Calcium: 8.1 mg/dL — ABNORMAL LOW (ref 8.9–10.3)
Chloride: 94 mmol/L — ABNORMAL LOW (ref 98–111)
Creatinine, Ser: 0.8 mg/dL (ref 0.44–1.00)
GFR, Estimated: >60 mL/min (ref >60)
Glucose, Bld: 186 mg/dL — ABNORMAL HIGH (ref 70–99)
Potassium: 3.8 mmol/L (ref 3.5–5.1)
Sodium: 139 mmol/L (ref 135–145)

## 2023-09-21 LAB — GLUCOSE, CAPILLARY
Glucose-Capillary: 197 mg/dL — ABNORMAL HIGH (ref 70–99)
Glucose-Capillary: 200 mg/dL — ABNORMAL HIGH (ref 70–99)
Glucose-Capillary: 202 mg/dL — ABNORMAL HIGH (ref 70–99)
Glucose-Capillary: 195 mg/dL — ABNORMAL HIGH (ref 70–99)

## 2023-09-21 MED ORDER — BUSPIRONE HCL 5 MG PO TABS
7.5000 mg | ORAL_TABLET | Freq: Two times a day (BID) | ORAL | Status: DC
  Administered 2023-09-21 – 2023-09-24 (×6): 7.5 mg via ORAL
  Filled 2023-09-21 (×6): qty 2

## 2023-09-21 MED ORDER — POTASSIUM CHLORIDE 10 MEQ/100ML IV SOLN
10.0000 meq | INTRAVENOUS | Status: AC
Start: 2023-09-21 — End: 2023-09-21
  Administered 2023-09-21 (×4): 10 meq via INTRAVENOUS
  Filled 2023-09-21: qty 100

## 2023-09-21 MED ORDER — LIDOCAINE 5 % EX PTCH
1.0000 | MEDICATED_PATCH | CUTANEOUS | Status: DC
Start: 2023-09-21 — End: 2023-09-24
  Administered 2023-09-21 – 2023-09-23 (×3): 1 via TRANSDERMAL
  Filled 2023-09-21 (×3): qty 1

## 2023-09-21 MED ORDER — ACETAMINOPHEN 325 MG PO TABS
650.0000 mg | ORAL_TABLET | Freq: Four times a day (QID) | ORAL | Status: DC
Start: 2023-09-21 — End: 2023-09-24
  Administered 2023-09-21 – 2023-09-24 (×11): 650 mg via ORAL
  Filled 2023-09-21 (×11): qty 2

## 2023-09-21 NOTE — Plan of Care (Addendum)
FMTS Brief Progress Note  S: Patient complains of dry cough for the past 2 days which causes lateral chest wall pain each time she coughs, worse on the right.  Also complains of anxiety at night when she is wearing BiPAP.  States BiPAP always causes her anxiety.   O: BP 115/79 (BP Location: Left Arm)   Pulse 83   Temp 98.6 F (37 C) (Oral)   Resp 17   Ht 5\' 5"  (1.651 m)   Wt (!) 148 kg   SpO2 92%   BMI 54.30 kg/m   Exam: Patient is breathing comfortably on HFNC with O2 sats in low 90s with mild occasional dry cough.  Lungs CTAB anteriorly.  NAD  A/P: Acute on chronic hypercapnic respiratory failure HFpEF CAP COPD Anxiety Dry cough in setting of the above causing chest wall pain.  Low concern for PE causing the chest wall pain as it is directly associated with cough and she is not tachycardic. -Will schedule Tylenol and add lidocaine patch for chest wall pain -Encouraged hot tea with honey for cough -BuSpar added for anxiety which is exacerbated by use of BiPAP every night -Rest of plan as noted in day team's progress note  Erick Alley, DO 09/21/2023, 8:22 PM PGY-3, [facility] Night Resident  Please page [phone] with questions.

## 2023-09-21 NOTE — Assessment & Plan Note (Signed)
Remains afebrile and without white count.  Patient improved despite lack of vancomycin.  MRSA swab was positive however given this improvement MRSA most likely not a component of her pneumonia. -CTX 1 g daily (day 3 of 5), azithromycin 500 mg daily (day 2 of 3) -Will need follow up imaging for resolution after treatment -Monitor fever curve -Follow blood cultures

## 2023-09-21 NOTE — Plan of Care (Signed)
   Problem: Education: Goal: Ability to describe self-care measures that may prevent or decrease complications (Diabetes Survival Skills Education) will improve Outcome: Not Progressing Goal: Individualized Educational Video(s) Outcome: Not Progressing   Problem: Coping: Goal: Ability to adjust to condition or change in health will improve Outcome: Not Progressing   Problem: Fluid Volume: Goal: Ability to maintain a balanced intake and output will improve Outcome: Not Progressing   Problem: Health Behavior/Discharge Planning: Goal: Ability to identify and utilize available resources and services will improve Outcome: Not Progressing Goal: Ability to manage health-related needs will improve Outcome: Not Progressing   Problem: Metabolic: Goal: Ability to maintain appropriate glucose levels will improve Outcome: Not Progressing   Problem: Nutritional: Goal: Maintenance of adequate nutrition will improve Outcome: Not Progressing Goal: Progress toward achieving an optimal weight will improve Outcome: Not Progressing   Problem: Skin Integrity: Goal: Risk for impaired skin integrity will decrease Outcome: Not Progressing   Problem: Tissue Perfusion: Goal: Adequacy of tissue perfusion will improve Outcome: Not Progressing

## 2023-09-21 NOTE — Plan of Care (Signed)

## 2023-09-21 NOTE — Progress Notes (Signed)
Daily Progress Note Intern Pager: [phone]  Patient name: Chelsea [name] Medical record [id number] Date of birth: [birth year] Age: 57 y.o. Gender: female  Primary Care Provider: Pcp, No Consultants: None Code Status: Full   Pt Overview and Major Events to Date:  12/20 - Admitted   Assessment and Plan: Chelsea [name] is a 57 y.o. female with significant PMH of OSA/OHS, COPD, HFpEF, A-flutter on Eliquis, T2DM, HTN, who presented with increased work of breathing secondary to hypoxic respiratory failure.  This is most likely in the setting of Versus CHF exacerbation.  Patient is improved compared to yesterday and close to her baseline oxygen requirement now. Assessment & Plan Acute hypoxic on chronic hypercapnic respiratory failure (HCC) This a.m. required high flow nasal cannula as she did not wear her BiPAP overnight (as recommended for home); however was close to home oxygen requirement otherwise.  Still unclear whether worsening of respiratory status was due to CHF exacerbation or pneumonia however patient has been afebrile.   -Continuous pulse ox -Nightly BiPAP -O2 goal 88-92% (HFpEF) heart failure with preserved ejection fraction (HCC) Seems to be similar volume status compared to prior presentations however difficult to assess given body habitus. -Continue spironolactone 25 mg daily, empagliflozin 25 mg daily -Restarted home torsemide 60 mg twice daily -Strict I&Os, daily weights -Continuous cardiac monitoring -Bladder scans every 8h, I&O cath >300 mL -Continue to hold oxybutynin CAP (community acquired pneumonia) Remains afebrile and without white count.  Patient improved despite lack of vancomycin.  MRSA swab was positive however given this improvement MRSA most likely not a component of her pneumonia. -CTX 1 g daily (day 3 of 5), azithromycin 500 mg daily (day 2 of 3) -Will need follow up imaging for resolution after treatment -Monitor fever  curve -Follow blood cultures COPD (chronic obstructive pulmonary disease) (HCC) Chronic issue.  Do not favor acute exacerbation given that cough is nonproductive.  Minimal wheezing in lungs. -Continue home budesonide nebulizer BID and montelukast 10 mg daily -DuoNebs Q4h PRN for wheezing -O2 goal 88-92% T2DM (type 2 diabetes mellitus) (HCC) A1c 9.3 on 11/15 and poorly controlled. Home regimen: Trulicity 0.75 mg weekly, glargine 30 units daily, empagliflozin 25 mg daily -Continue empagliflozin 25 mg daily -CBGs ACHS -mSSI -Adjust LAI based on CBGs when tolerating PO and off BiPAP Chronic anemia Stable relative to baseline.  Likely IDA with ferritin 29 in August 2024. -Continue ferrous sulfate 325 mg daily -AM CBC Urinary retention Pharmacy tech noted oxybutynin 2.5 mg BID on med rec. -Hold oxybutynin for now given centrally acting off target effects in setting of somnolence -Bladder scans Q8h, I&O cath >300 mL   Chronic and Stable Issues: Aflutter - Continue home amiodarone 200 mg, apixaban 20 mg  RLS - Contineu home requip   FEN/GI: Regular  PPx: Anticoagulated with home eliquis  Dispo: Back to [facility] facility pending clinical improvement   Subjective:  Patient states that she feels much better today.  States she did not wear her BiPAP overnight due to discomfort.  She was able to eat her breakfast this morning without worsening shortness of breath.  Feels she may be ready to leave the hospital tomorrow.  Minimal coughing.  No chest pain  Objective: Temp:  [98 F (36.7 C)-99.6 F (37.6 C)] 99.6 F (37.6 C) (12/21 1500) Pulse Rate:  [76-90] 90 (12/21 1438) Resp:  [20-30] 29 (12/21 1438) BP: (101-132)/(58-86) 103/58 (12/21 1500) SpO2:  [88 %-100 %] 88 % (12/21 1438) FiO2 (%):  [  40 %-50 %] 40 % (12/21 1438) Weight:  [148 kg] 148 kg (12/21 0300) Physical Exam: General: Chronically ill-appearing, obese, in no acute distress Cardiovascular: Irregular rhythm but  normal rate, well-perfused, bilateral lower extremity edema up to the knees 1+, with some evidence of venous stasis Respiratory: Difficult to assess given body habitus however auscultated some dispersed wheezes, no crackles, no rhonchi, on 11 L O2 Abdomen: Soft, nondistended, nontender   Laboratory: Most recent CBC Lab Results  Component Value Date   WBC 7.7 09/21/2023   HGB 10.4 (L) 09/21/2023   HCT 35.4 (L) 09/21/2023   MCV 92.7 09/21/2023   PLT 224 09/21/2023   Most recent BMP    Latest Ref Rng & Units 09/21/2023    4:17 AM  BMP  Glucose 70 - 99 mg/dL 630   BUN 6 - 20 mg/dL 18   Creatinine 1.60 - 1.00 mg/dL 1.09   Sodium 323 - 557 mmol/L 139   Potassium 3.5 - 5.1 mmol/L 3.8   Chloride 98 - 111 mmol/L 94   CO2 22 - 32 mmol/L 40   Calcium 8.9 - 10.3 mg/dL 8.1     Lockie Mola, MD 09/21/2023, 3:45 PM  PGY-2, [facility] FPTS Intern pager: [phone], text pages welcome Secure chat group [facility] Teaching Service

## 2023-09-21 NOTE — Assessment & Plan Note (Signed)
This a.m. required high flow nasal cannula as she did not wear her BiPAP overnight (as recommended for home); however was close to home oxygen requirement otherwise.  Still unclear whether worsening of respiratory status was due to CHF exacerbation or pneumonia however patient has been afebrile.   -Continuous pulse ox -Nightly BiPAP -O2 goal 88-92%

## 2023-09-21 NOTE — Assessment & Plan Note (Signed)
Pharmacy tech noted oxybutynin 2.5 mg BID on med rec. -Hold oxybutynin for now given centrally acting off target effects in setting of somnolence -Bladder scans Q8h, I&O cath >300 mL

## 2023-09-21 NOTE — Assessment & Plan Note (Signed)
Stable relative to baseline.  Likely IDA with ferritin 29 in August 2024. -Continue ferrous sulfate 325 mg daily -AM CBC

## 2023-09-21 NOTE — Assessment & Plan Note (Signed)
A1c 9.3 on 11/15 and poorly controlled. Home regimen: Trulicity 0.75 mg weekly, glargine 30 units daily, empagliflozin 25 mg daily -Continue empagliflozin 25 mg daily -CBGs ACHS -mSSI -Adjust LAI based on CBGs when tolerating PO and off BiPAP

## 2023-09-21 NOTE — Assessment & Plan Note (Signed)
Seems to be similar volume status compared to prior presentations however difficult to assess given body habitus. -Continue spironolactone 25 mg daily, empagliflozin 25 mg daily -Restarted home torsemide 60 mg twice daily -Strict I&Os, daily weights -Continuous cardiac monitoring -Bladder scans every 8h, I&O cath >300 mL -Continue to hold oxybutynin

## 2023-09-21 NOTE — Assessment & Plan Note (Signed)
Chronic issue.  Do not favor acute exacerbation given that cough is nonproductive.  Minimal wheezing in lungs. -Continue home budesonide nebulizer BID and montelukast 10 mg daily -DuoNebs Q4h PRN for wheezing -O2 goal 88-92%

## 2023-09-22 DIAGNOSIS — J9612 Chronic respiratory failure with hypercapnia: Secondary | ICD-10-CM | POA: Diagnosis not present

## 2023-09-22 DIAGNOSIS — I5033 Acute on chronic diastolic (congestive) heart failure: Secondary | ICD-10-CM | POA: Diagnosis not present

## 2023-09-22 DIAGNOSIS — R918 Other nonspecific abnormal finding of lung field: Secondary | ICD-10-CM | POA: Insufficient documentation

## 2023-09-22 DIAGNOSIS — J9601 Acute respiratory failure with hypoxia: Secondary | ICD-10-CM | POA: Diagnosis not present

## 2023-09-22 LAB — GLUCOSE, CAPILLARY
Glucose-Capillary: 190 mg/dL — ABNORMAL HIGH (ref 70–99)
Glucose-Capillary: 237 mg/dL — ABNORMAL HIGH (ref 70–99)
Glucose-Capillary: 253 mg/dL — ABNORMAL HIGH (ref 70–99)
Glucose-Capillary: 289 mg/dL — ABNORMAL HIGH (ref 70–99)

## 2023-09-22 LAB — BASIC METABOLIC PANEL
Anion gap: 15 (ref 5–15)
BUN: 18 mg/dL (ref 6–20)
CO2: 41 mmol/L — ABNORMAL HIGH (ref 22–32)
Calcium: 8.8 mg/dL — ABNORMAL LOW (ref 8.9–10.3)
Chloride: 85 mmol/L — ABNORMAL LOW (ref 98–111)
Creatinine, Ser: 0.94 mg/dL (ref 0.44–1.00)
GFR, Estimated: >60 mL/min (ref >60)
Glucose, Bld: 191 mg/dL — ABNORMAL HIGH (ref 70–99)
Potassium: 4 mmol/L (ref 3.5–5.1)
Sodium: 141 mmol/L (ref 135–145)

## 2023-09-22 LAB — MAGNESIUM
Magnesium: 1.9 mg/dL (ref 1.7–2.4)
Magnesium: 1.8 mg/dL (ref 1.7–2.4)

## 2023-09-22 MED ORDER — INSULIN ASPART 100 UNIT/ML IJ SOLN
0.0000 [IU] | Freq: Three times a day (TID) | INTRAMUSCULAR | Status: DC
  Administered 2023-09-22: 7 [IU] via SUBCUTANEOUS
  Administered 2023-09-22 – 2023-09-24 (×5): 11 [IU] via SUBCUTANEOUS
  Administered 2023-09-24: 7 [IU] via SUBCUTANEOUS

## 2023-09-22 MED ORDER — FUROSEMIDE 10 MG/ML IJ SOLN
80.0000 mg | Freq: Two times a day (BID) | INTRAMUSCULAR | Status: DC
  Administered 2023-09-22 – 2023-09-23 (×2): 80 mg via INTRAVENOUS
  Filled 2023-09-22 (×3): qty 8

## 2023-09-22 MED ORDER — INSULIN ASPART 100 UNIT/ML IJ SOLN
0.0000 [IU] | Freq: Every day | INTRAMUSCULAR | Status: DC
  Administered 2023-09-22 – 2023-09-23 (×2): 3 [IU] via SUBCUTANEOUS

## 2023-09-22 MED ORDER — MAGNESIUM SULFATE 2 GM/50ML IV SOLN
2.0000 g | Freq: Once | INTRAVENOUS | Status: AC
  Administered 2023-09-22: 2 g via INTRAVENOUS
  Filled 2023-09-22: qty 50

## 2023-09-22 NOTE — Assessment & Plan Note (Deleted)
Stable relative to baseline.  Likely IDA with ferritin 29 in August 2024. -Continue ferrous sulfate 325 mg daily -AM CBC

## 2023-09-22 NOTE — Assessment & Plan Note (Deleted)
Pharmacy tech noted oxybutynin 2.5 mg BID on med rec. -Hold oxybutynin for now given centrally acting off target effects in setting of somnolence -Bladder scans Q8h, I&O cath >300 mL

## 2023-09-22 NOTE — Assessment & Plan Note (Addendum)
Bilateral ground glass opacities. Thought to represent possible multifocal PNA so patient was started on abx. At this point it seems more likely that these represent pulmonary edema.  - Complete course of ceftriaxone (day 4/5) and azithromycin (day 3/3) - Diuresis as above - Will need repeat scan +/- pulm consult once adequately diuresed and back on home O2 to evaluate for underlying lung disease. This could be done as an outpatient.

## 2023-09-22 NOTE — Assessment & Plan Note (Addendum)
As above, think this is the primary driver to her symptoms. Acutely exacerbated.  -Restarting IV diuresis today, still has room to go. Lasix 80mg  IV BID starting this evening.  -Continue spironolactone 25 mg daily, empagliflozin 25 mg daily -Strict I&Os, daily weights. Today's weight (93kg) seems spurious, have asked for a repeat.  -Continuous cardiac monitoring -Check lytes, replete to K>4, Mg>2 -Bladder scans every 8h, I&O cath >300 mL

## 2023-09-22 NOTE — Progress Notes (Addendum)
Daily Progress Note Intern Pager: [phone]  Patient name: Chelsea [name] Medical record number: [id number] Date of birth: 08-29-1966 Age: 57 y.o. Gender: female  Primary Care Provider: Pcp, No Consultants: None Code Status: Full  Pt Overview and Major Events to Date:  12/20- admitted  Assessment and Plan: Chelsea [name] is a 57yo female admitted for acute hypoxic respiratory failure likely 2/2 to overlap of CHF, OSA, OHS. Pertinent PMH/PSH includes HFpEF, OSA, OHS, COPD, Atrial flutter on Eliquis, DM2, HTN.   Assessment & Plan Acute hypoxic on chronic hypercapnic respiratory failure (HCC) I think she is still volume up. Likely still needs some aggressive IV diuresis. This seems to be a more compelling explanation for her persistent symptoms at this time than possible pneumonia which has been present on imaging since September.  -Already had her torsemide for the morning, will PM dose 80mg  IV Lasix -Continuous pulse ox -Nightly BiPAP -O2 goal 88-92% (HFpEF) heart failure with preserved ejection fraction (HCC) As above, think this is the primary driver to her symptoms. Acutely exacerbated.  -Restarting IV diuresis today, still has room to go. Lasix 80mg  IV BID starting this evening.  -Continue spironolactone 25 mg daily, empagliflozin 25 mg daily -Strict I&Os, daily weights. Today's weight (93kg) seems spurious, have asked for a repeat.  -Continuous cardiac monitoring -Check lytes, replete to K>4, Mg>2 -Bladder scans every 8h, I&O cath >300 mL Abnormal CT scan of lung Bilateral ground glass opacities. Thought to represent possible multifocal PNA so patient was started on abx. At this point it seems more likely that these represent pulmonary edema.  - Complete course of ceftriaxone (day 4/5) and azithromycin (day 3/3) - Diuresis as above - Will need repeat scan +/- pulm consult once adequately diuresed and back on home O2 to evaluate for underlying lung disease. This  could be done as an outpatient.  T2DM (type 2 diabetes mellitus) (HCC) A1c 9.3 on 11/15 and poorly controlled. Glucose today is 190.  Home regimen: Trulicity 0.75 mg weekly, glargine 30 units daily, empagliflozin 25 mg daily -Continue empagliflozin 25 mg daily -CBGs AC/HS -Increase her SSI from moderate to resistant  Chronic and Stable Conditions: IDA - ferrous sulfate daily COPD - Budesonide BID, singulair 10mg  daily, duonebs q4h PRN Atrial flutter - continue home amiodarone 200mg  daily and Apixaban 5mg  BID Restless legs - continue home requip, ferrous sulfate   FEN/GI: Regular PPx: On Eliquis Dispo: Back to her home facility [facility] health) pending further diuresis .   Subjective:  Feeling better but feels that her "breathing is just not there yet." Also feeling some "tightness" in her legs.   Objective: Temp:  [98.2 F (36.8 C)-99.6 F (37.6 C)] 98.5 F (36.9 C) (12/22 0800) Pulse Rate:  [71-90] 71 (12/22 0333) Resp:  [17-29] 20 (12/22 0333) BP: (103-124)/(58-82) 115/80 (12/22 0800) SpO2:  [88 %-93 %] 93 % (12/22 0333) FiO2 (%):  [40 %-50 %] 50 % (12/21 2108) Weight:  [93 kg] 93 kg (12/22 0335) Physical Exam: General: Chronically ill appearing, obese, NAD. On 10L HFNC Cardiovascular: Regular rate and rhythm, 3/6 systolic murmur. Respiratory: Normal WOB on 10L [place]. Lung exam limited by body habitus Abdomen: Non-tender Extremities: 2+ gravity dependent pitting edema to above the knees bilaterally, venous stasis changes to the anterior shins   Laboratory: Most recent CBC Lab Results  Component Value Date   WBC 7.7 09/21/2023   HGB 10.4 (L) 09/21/2023   HCT 35.4 (L) 09/21/2023   MCV 92.7 09/21/2023  PLT 224 09/21/2023   Most recent BMP    Latest Ref Rng & Units 09/22/2023    4:49 AM  BMP  Glucose 70 - 99 mg/dL 829   BUN 6 - 20 mg/dL 18   Creatinine 5.62 - 1.00 mg/dL 1.30   Sodium 865 - 784 mmol/L 141   Potassium 3.5 - 5.1 mmol/L 4.0   Chloride 98 - 111  mmol/L 85   CO2 22 - 32 mmol/L 41   Calcium 8.9 - 10.3 mg/dL 8.8     Imaging/Diagnostic Tests: No new imaging, tests.   Alicia Amel, MD 09/22/2023, 9:17 AM  PGY-3, [facility] FPTS Intern pager: [phone], text pages welcome Secure chat group [facility] Teaching Service

## 2023-09-22 NOTE — Assessment & Plan Note (Addendum)
A1c 9.3 on 11/15 and poorly controlled. Glucose today is 190.  Home regimen: Trulicity 0.75 mg weekly, glargine 30 units daily, empagliflozin 25 mg daily -Continue empagliflozin 25 mg daily -CBGs AC/HS -Increase her SSI from moderate to resistant

## 2023-09-22 NOTE — Assessment & Plan Note (Addendum)
I think she is still volume up. Likely still needs some aggressive IV diuresis. This seems to be a more compelling explanation for her persistent symptoms at this time than possible pneumonia which has been present on imaging since September.  -Already had her torsemide for the morning, will PM dose 80mg  IV Lasix -Continuous pulse ox -Nightly BiPAP -O2 goal 88-92%

## 2023-09-22 NOTE — Assessment & Plan Note (Deleted)
Chronic issue.  Do not favor acute exacerbation given that cough is nonproductive.  Minimal wheezing in lungs. -Continue home budesonide nebulizer BID and montelukast 10 mg daily -DuoNebs Q4h PRN for wheezing -O2 goal 88-92%

## 2023-09-22 NOTE — Assessment & Plan Note (Deleted)
Remains afebrile and without white count.  Patient improved despite lack of vancomycin.  MRSA swab was positive however given this improvement MRSA most likely not a component of her pneumonia. -CTX 1 g daily (day 4 of 5), azithromycin 500 mg daily (day 3 of 3) -Will need follow up imaging for resolution after treatment -Monitor fever curve -Follow blood cultures

## 2023-09-22 NOTE — Plan of Care (Signed)
  Problem: Education: Goal: Ability to describe self-care measures that may prevent or decrease complications (Diabetes Survival Skills Education) will improve Outcome: Not Progressing Goal: Individualized Educational Video(s) Outcome: Not Progressing   Problem: Coping: Goal: Ability to adjust to condition or change in health will improve Outcome: Not Progressing   Problem: Fluid Volume: Goal: Ability to maintain a balanced intake and output will improve Outcome: Progressing   Problem: Health Behavior/Discharge Planning: Goal: Ability to identify and utilize available resources and services will improve Outcome: Not Progressing Goal: Ability to manage health-related needs will improve Outcome: Not Progressing   Problem: Metabolic: Goal: Ability to maintain appropriate glucose levels will improve Outcome: Not Progressing   Problem: Nutritional: Goal: Maintenance of adequate nutrition will improve Outcome: Not Progressing Goal: Progress toward achieving an optimal weight will improve Outcome: Not Progressing   Problem: Skin Integrity: Goal: Risk for impaired skin integrity will decrease Outcome: Not Progressing   Problem: Tissue Perfusion: Goal: Adequacy of tissue perfusion will improve Outcome: Not Progressing

## 2023-09-22 NOTE — Progress Notes (Signed)
RN witnessed pt drinking soda while wearing the Bipap.  RN educated pt about aspiration risk, and continued to observe pt slip the straw under her mask and drink.  RN removed soda from bedside, and educated pt that she should not drink while on the machine but if she was going to be noncompliant at least drink water to decrease risk of aspiration pneumonia.

## 2023-09-23 DIAGNOSIS — E662 Morbid (severe) obesity with alveolar hypoventilation: Secondary | ICD-10-CM | POA: Diagnosis not present

## 2023-09-23 DIAGNOSIS — J9601 Acute respiratory failure with hypoxia: Secondary | ICD-10-CM | POA: Diagnosis not present

## 2023-09-23 DIAGNOSIS — J9612 Chronic respiratory failure with hypercapnia: Secondary | ICD-10-CM | POA: Diagnosis not present

## 2023-09-23 DIAGNOSIS — I5033 Acute on chronic diastolic (congestive) heart failure: Secondary | ICD-10-CM | POA: Diagnosis not present

## 2023-09-23 LAB — GLUCOSE, CAPILLARY
Glucose-Capillary: 254 mg/dL — ABNORMAL HIGH (ref 70–99)
Glucose-Capillary: 260 mg/dL — ABNORMAL HIGH (ref 70–99)
Glucose-Capillary: 251 mg/dL — ABNORMAL HIGH (ref 70–99)
Glucose-Capillary: 279 mg/dL — ABNORMAL HIGH (ref 70–99)

## 2023-09-23 LAB — BASIC METABOLIC PANEL
Anion gap: 13 (ref 5–15)
BUN: 14 mg/dL (ref 6–20)
CO2: 44 mmol/L — ABNORMAL HIGH (ref 22–32)
Calcium: 8.8 mg/dL — ABNORMAL LOW (ref 8.9–10.3)
Chloride: 82 mmol/L — ABNORMAL LOW (ref 98–111)
Creatinine, Ser: 0.83 mg/dL (ref 0.44–1.00)
GFR, Estimated: >60 mL/min (ref >60)
Glucose, Bld: 201 mg/dL — ABNORMAL HIGH (ref 70–99)
Potassium: 3.5 mmol/L (ref 3.5–5.1)
Sodium: 139 mmol/L (ref 135–145)

## 2023-09-23 LAB — MAGNESIUM: Magnesium: 2.1 mg/dL (ref 1.7–2.4)

## 2023-09-23 MED ORDER — INSULIN GLARGINE-YFGN 100 UNIT/ML ~~LOC~~ SOLN
15.0000 [IU] | Freq: Every day | SUBCUTANEOUS | Status: DC
  Administered 2023-09-23: 15 [IU] via SUBCUTANEOUS
  Filled 2023-09-23 (×2): qty 0.15

## 2023-09-23 MED ORDER — TORSEMIDE 20 MG PO TABS
60.0000 mg | ORAL_TABLET | Freq: Two times a day (BID) | ORAL | Status: DC
  Administered 2023-09-23 – 2023-09-24 (×2): 60 mg via ORAL
  Filled 2023-09-23 (×3): qty 3

## 2023-09-23 MED ORDER — MUPIROCIN 2 % EX OINT
1.0000 | TOPICAL_OINTMENT | Freq: Two times a day (BID) | CUTANEOUS | Status: DC
  Administered 2023-09-23 – 2023-09-24 (×3): 1 via NASAL
  Filled 2023-09-23 (×3): qty 22

## 2023-09-23 MED ORDER — POTASSIUM CHLORIDE CRYS ER 20 MEQ PO TBCR
40.0000 meq | EXTENDED_RELEASE_TABLET | Freq: Three times a day (TID) | ORAL | Status: AC
  Administered 2023-09-23 (×2): 40 meq via ORAL
  Filled 2023-09-23 (×2): qty 2

## 2023-09-23 NOTE — Progress Notes (Signed)
Daily Progress Note Intern Pager: [phone]  Patient name: Chelsea [name] Medical record number: [id number] Date of birth: [birth year] Age: 57 y.o. Gender: female  Primary Care Provider: Pcp, No Consultants: None Code Status: Full  Pt Overview and Major Events to Date:  12/20: Admitted   Assessment and Plan: AVARI BERKNER is a 57 y.o. female with a pertinent PMH of Aflutter on Eliquis, T2DM, HTN, as well as chronic hypercarbic hypoxemic respiratory failure 2/2 combination of HFpEF, OSA, OHS and COPD who presented with acute on chronic hypercapnic hypoxemic respiratory failure.  Appears more euvolemic, stable on home 5 L [place] and compliant with BiPAP overnight.  Will switch to home oral diuretics today and have PT evaluate her.  Hope to discharge tomorrow back to facility if stable. Assessment & Plan Acute hypoxic on chronic hypercapnic respiratory failure (HCC) Likely primarily driven by CHF exacerbation in setting of limited respiratory reserve 2/2 OSA, OHS, COPD.  Now  stable on home 5 L [place].  Continuing diuresis as below. -Continuous pulse ox -PT eval and treat for discharge planning. -Nightly BiPAP, compliant inpatient -O2 goal 88-92% (HFpEF) heart failure with preserved ejection fraction (HCC) More euvolemic on exam.  UOP 2.6 L past 24 hours, net negative ~7.8 L since admission.  Difficult to establish dry weight this admission due to inconsistencies in weights, but likely near dry weight.  Continue diuresis and transition to PO. -Transition to home torsemide 60 mg BID -Continue spironolactone 25 mg daily, empagliflozin 25 mg daily -Strict I&Os, daily weights, today's weight inaccurate -Continuous cardiac monitoring -Daily BMP, Mag; replete to K>4, Mg>2 -Not retaining urine on bladder scans, will discontinue today Abnormal CT scan of lung Bilateral ground glass opacities on admission.  More likely pulmonary edema than PNA, but completing Abx course  today -Complete course of ceftriaxone (day 5/5) and azithromycin (completed 3 days) -Diuresis as above -Will need repeat imaging to evaluate for underlying lung disease outpatient COPD (chronic obstructive pulmonary disease) (HCC) Chronic issue.  Do not favor acute exacerbation given that cough is nonproductive.  Minimal wheezing in lungs. -Continue home budesonide nebulizer BID and montelukast 10 mg daily -DuoNebs Q4h PRN for wheezing -O2 goal 88-92% T2DM (type 2 diabetes mellitus) (HCC) A1c 9.3 on 11/15 and poorly controlled. Glucose above goal. Home regimen: Trulicity 0.75 mg weekly, glargine 30 units daily, empagliflozin 25 mg daily -Continue empagliflozin 25 mg daily -Add LAI 15 units daily today -CBGs ACHS -Increase her SSI from moderate to resistant Urinary retention Pharmacy tech noted oxybutynin 2.5 mg BID on med rec. -Hold oxybutynin for now given centrally acting off target effects in setting of somnolence -Bladder scans Q8h, I&O cath >300 mL  Chronic and Stable Problems: Hypothyroidism: Cont home levothyroxine 150 mcg daily  HLD: Cont home atorvastatin 20 mg QHS RLS: Cont home ropinirole 0.5 every other day, ferrous sulfate 325 mg daily GERD: Continue pantoprazole 40 mg daily (formulary equivalent for home omeprazole 20 mg) IDA: Ferrous sulfate daily COPD: Budesonide BID, singulair 10 mg daily, duonebs Q4h PRN Atrial flutter: Continue home amiodarone 200 mg daily and apixaban 5 mg BID  FEN/GI: Heart healthy diet PPx: Eliquis 5 mg BID Dispo:  [facility] facility once euvolemic.  Subjective:  Breathing feels better today, but still somewhat dyspneic.  Objective: Temp:  [98.5 F (36.9 C)-98.8 F (37.1 C)] 98.8 F (37.1 C) (12/23 0300) Pulse Rate:  [70-77] 74 (12/23 0300) Resp:  [20-24] 22 (12/23 0300) BP: (103-132)/(62-82) 132/82 (12/23 0300) SpO2:  [90 %-  97 %] 97 % (12/23 0333) FiO2 (%):  [50 %] 50 % (12/22 2142) Weight:  [88 kg-90 kg] 88 kg (12/23  0158)  Physical Exam: General: Resting comfortably in bed, NAD, alert and at baseline.  Large body habitus. Cardiovascular: Regular rate and rhythm. Normal S1/S2. No murmurs, rubs, or gallops appreciated. 2+ radial pulses. Pulmonary: Clear bilaterally to ascultation. No increased WOB, no accessory muscle usage on 5 L [place]. No wheezes, minimal crackles. Abdominal: Normoactive bowel sounds, nondistended. No tenderness to deep or light palpation. No rebound or guarding. No HSM. Skin: Warm and dry. Extremities: No peripheral edema bilaterally.  Capillary refill <2 seconds.  Laboratory: Most recent CBC Lab Results  Component Value Date   WBC 7.7 09/21/2023   HGB 10.4 (L) 09/21/2023   HCT 35.4 (L) 09/21/2023   MCV 92.7 09/21/2023   PLT 224 09/21/2023   Most recent BMP    Latest Ref Rng & Units 09/23/2023    4:20 AM  BMP  Glucose 70 - 99 mg/dL 045   BUN 6 - 20 mg/dL 14   Creatinine 4.09 - 1.00 mg/dL 8.11   Sodium 914 - 782 mmol/L 139   Potassium 3.5 - 5.1 mmol/L 3.5   Chloride 98 - 111 mmol/L 82   CO2 22 - 32 mmol/L 44   Calcium 8.9 - 10.3 mg/dL 8.8     Other pertinent labs: -None  New Imaging/Diagnostic Tests: -None  Betzalel Umbarger, MD 09/23/2023, 7:15 AM  PGY-1, [facility] FPTS Intern pager: [phone], text pages welcome Secure chat group [facility] Teaching Service

## 2023-09-23 NOTE — Assessment & Plan Note (Deleted)
Stable relative to baseline.  Likely IDA with ferritin 29 in August 2024. -Continue ferrous sulfate 325 mg daily -AM CBC

## 2023-09-23 NOTE — Assessment & Plan Note (Addendum)
Bilateral ground glass opacities on admission.  More likely pulmonary edema than PNA, but completing Abx course today -Complete course of ceftriaxone (day 5/5) and azithromycin (completed 3 days) -Diuresis as above -Will need repeat imaging to evaluate for underlying lung disease outpatient

## 2023-09-23 NOTE — Assessment & Plan Note (Addendum)
Likely primarily driven by CHF exacerbation in setting of limited respiratory reserve 2/2 OSA, OHS, COPD.  Now  stable on home 5 L [place].  Continuing diuresis as below. -Continuous pulse ox -PT eval and treat for discharge planning. -Nightly BiPAP, compliant inpatient -O2 goal 88-92%

## 2023-09-23 NOTE — Assessment & Plan Note (Signed)
Chronic issue.  Do not favor acute exacerbation given that cough is nonproductive.  Minimal wheezing in lungs. -Continue home budesonide nebulizer BID and montelukast 10 mg daily -DuoNebs Q4h PRN for wheezing -O2 goal 88-92%

## 2023-09-23 NOTE — Assessment & Plan Note (Signed)
Pharmacy tech noted oxybutynin 2.5 mg BID on med rec. -Hold oxybutynin for now given centrally acting off target effects in setting of somnolence -Bladder scans Q8h, I&O cath >300 mL

## 2023-09-23 NOTE — Care Management Important Message (Signed)
Important Message  Patient Details  Name: Chelsea [name] MRN: [id number] Date of Birth: 31-Dec-1965   Important Message Given:  Yes - Medicare IM     Sherilyn Banker 09/23/2023, 3:12 PM

## 2023-09-23 NOTE — Plan of Care (Signed)

## 2023-09-23 NOTE — Progress Notes (Signed)
   09/23/23 2112  BiPAP/CPAP/SIPAP  BiPAP/CPAP/SIPAP Pt Type Adult  BiPAP/CPAP/SIPAP V60  Mask Type Full face mask  Mask Size Medium  Set Rate 16 breaths/min  Respiratory Rate 24 breaths/min  IPAP 16 cmH20  EPAP 6 cmH2O  Pressure Support 10 cmH20  FiO2 (%) 50 %  Minute Ventilation 8.8  Leak 2  Peak Inspiratory Pressure (PIP) 17  Tidal Volume (Vt) 363  Patient Home Equipment No  Auto Titrate No  Press High Alarm 25 cmH2O  Press Low Alarm 5 cmH2O   Pt resting comfortably on bipap. RT will continue to monitor

## 2023-09-23 NOTE — Evaluation (Signed)
Physical Therapy Evaluation Patient Details Name: Chelsea [name] MRN: [id number] DOB: 1966-06-09 Today's Date: 09/23/2023  History of Present Illness  57 yo female presents to [facility] on 12/19 from [facility] with acute on chronic hypercapnic respiratory failure with fluid overload. PMH includes: chronic respiratory failure with hypoxia on 5LO2 chronically and bipap at night, CHF, CAD, COPD, OSA on CPAP, afib, HLD, HTN, DM II, morbid obesity, cervical cancer, and chronic back pain.  Clinical Impression   Pt presents at baseline level of mobility, is mostly bed-level but does hoyer lift OOB at facility. Pt on baseline 5LO2, desats with rolling bilat to 78-85% but pt states this is typical for her and she recovers to 90s with breathing technique and rest. Pt has goals to progress mobility, recommend continuing PT and OT services in SNF once d/c back to [facility]. Pt with no further acute PT needs given at baseline level of functioning at this time.         If plan is discharge home, recommend the following: Two people to help with walking and/or transfers;Two people to help with bathing/dressing/bathroom   Can travel by private vehicle        Equipment Recommendations None recommended by PT  Recommendations for Other Services       Functional Status Assessment Patient has not had a recent decline in their functional status (likely at baseline, would like to continue to work with therapies at [facility])     Precautions / Restrictions Precautions Precautions: Fall Precaution Comments: 5LO2 Restrictions Weight Bearing Restrictions Per Provider Order: No      Mobility  Bed Mobility Overal bed mobility: Needs Assistance Bed Mobility: Rolling Rolling: Mod assist         General bed mobility comments: assist for trunk translation, use of bed pads to facilitate. Pt boosted self up in bed after bed pad change with trendelenburg feature    Transfers                    General transfer comment: nt - lift at baseline    Ambulation/Gait                  Stairs            Wheelchair Mobility     Tilt Bed    Modified Rankin (Stroke Patients Only)       Balance                                             Pertinent Vitals/Pain Pain Assessment Pain Assessment: Faces Faces Pain Scale: Hurts little more Pain Location: back Pain Descriptors / Indicators: Discomfort, Grimacing, Guarding Pain Intervention(s): Limited activity within patient's tolerance, Monitored during session, Repositioned    Home Living Family/patient expects to be discharged to:: Skilled nursing facility                        Prior Function Prior Level of Function : Needs assist             Mobility Comments: hoyer lift OOB to w/c, but working on LE WB in transfer attempts with therapies at rehab. Pt reports indep with rolling ADLs Comments: bed level ADLs, pt states she helsp as much as she can with UE bathing, dressing and grooming.     Extremity/Trunk Assessment  Upper Extremity Assessment Upper Extremity Assessment: Generalized weakness    Lower Extremity Assessment Lower Extremity Assessment: Generalized weakness (lacking terminal knee extension given bed-level)    Cervical / Trunk Assessment Cervical / Trunk Assessment: Other exceptions Cervical / Trunk Exceptions: bed-level  Communication   Communication Communication: No apparent difficulties Cueing Techniques: Verbal cues  Cognition Arousal: Alert Behavior During Therapy: WFL for tasks assessed/performed, Flat affect Overall Cognitive Status: Within Functional Limits for tasks assessed                                          General Comments      Exercises     Assessment/Plan    PT Assessment All further PT needs can be met in the next venue of care  PT Problem List Decreased activity tolerance;Decreased  strength;Decreased range of motion;Decreased balance;Decreased mobility;Cardiopulmonary status limiting activity;Obesity       PT Treatment Interventions      PT Goals (Current goals can be found in the Care Plan section)  Acute Rehab PT Goals Patient Stated Goal: back to SNF PT Goal Formulation: With patient Time For Goal Achievement: 09/23/23 Potential to Achieve Goals: Good    Frequency       Co-evaluation               AM-PAC PT "6 Clicks" Mobility  Outcome Measure Help needed turning from your back to your side while in a flat bed without using bedrails?: A Lot Help needed moving from lying on your back to sitting on the side of a flat bed without using bedrails?: Total Help needed moving to and from a bed to a chair (including a wheelchair)?: Total Help needed standing up from a chair using your arms (e.g., wheelchair or bedside chair)?: Total Help needed to walk in hospital room?: Total Help needed climbing 3-5 steps with a railing? : Total 6 Click Score: 7    End of Session Equipment Utilized During Treatment: Oxygen Activity Tolerance: Patient limited by fatigue Patient left: in bed;with call bell/phone within reach;with nursing/sitter in room Nurse Communication: Mobility status PT Visit Diagnosis: Other abnormalities of gait and mobility (R26.89);Muscle weakness (generalized) (M62.81)    Time: 1610-9604 PT Time Calculation (min) (ACUTE ONLY): 16 min   Charges:   PT Evaluation $PT Eval Low Complexity: 1 Low   PT General Charges $$ ACUTE PT VISIT: 1 Visit         Marye Round, PT DPT Acute Rehabilitation Services Secure Chat Preferred  Office [phone]   Anjolie Majer Sheliah Plane 09/23/2023, 4:46 PM

## 2023-09-23 NOTE — Plan of Care (Signed)

## 2023-09-23 NOTE — Assessment & Plan Note (Addendum)
A1c 9.3 on 11/15 and poorly controlled. Glucose above goal. Home regimen: Trulicity 0.75 mg weekly, glargine 30 units daily, empagliflozin 25 mg daily -Continue empagliflozin 25 mg daily -Add LAI 15 units daily today -CBGs ACHS -Increase her SSI from moderate to resistant

## 2023-09-23 NOTE — Inpatient Diabetes Management (Signed)
Inpatient Diabetes Program Recommendations  AACE/ADA: New Consensus Statement on Inpatient Glycemic Control (2015)  Target Ranges:  Prepandial:   less than 140 mg/dL      Peak postprandial:   less than 180 mg/dL (1-2 hours)      Critically ill patients:  140 - 180 mg/dL   Lab Results  Component Value Date   GLUCAP 254 (H) 09/23/2023   HGBA1C 9.3 (H) 08/16/2023    Review of Glycemic Control  Latest Reference Range & Units 09/22/23 08:23 09/22/23 11:56 09/22/23 15:57 09/22/23 21:00 09/23/23 07:41  Glucose-Capillary 70 - 99 mg/dL 034 (H) 742 (H) 595 (H) 289 (H) 254 (H)  (H): Data is abnormally high  Diabetes history: DM2 Outpatient Diabetes medications: Basaglar 30 units every day, Jardiance 25 mg every day, Trulicity 0.75 mg weekly Current orders for Inpatient glycemic control: Novolog 0-20 units TID and 0-5 units at bedtime, Jardiance 25 mg QD  Inpatient Diabetes Program Recommendations:    Please consider:  Semglee 15 units every day (50% of home dose).  Will continue to follow while inpatient.  Thank you, Dulce Sellar, MSN, CDCES Diabetes Coordinator Inpatient Diabetes Program [phone] (team pager from 8a-5p)

## 2023-09-23 NOTE — Assessment & Plan Note (Addendum)
More euvolemic on exam.  UOP 2.6 L past 24 hours, net negative ~7.8 L since admission.  Difficult to establish dry weight this admission due to inconsistencies in weights, but likely near dry weight.  Continue diuresis and transition to PO. -Transition to home torsemide 60 mg BID -Continue spironolactone 25 mg daily, empagliflozin 25 mg daily -Strict I&Os, daily weights, today's weight inaccurate -Continuous cardiac monitoring -Daily BMP, Mag; replete to K>4, Mg>2 -Not retaining urine on bladder scans, will discontinue today

## 2023-09-24 DIAGNOSIS — J9612 Chronic respiratory failure with hypercapnia: Secondary | ICD-10-CM | POA: Diagnosis not present

## 2023-09-24 DIAGNOSIS — J9601 Acute respiratory failure with hypoxia: Secondary | ICD-10-CM | POA: Diagnosis not present

## 2023-09-24 LAB — CULTURE, BLOOD (ROUTINE X 2): Culture: NO GROWTH

## 2023-09-24 LAB — BASIC METABOLIC PANEL
Anion gap: 13 (ref 5–15)
BUN: 14 mg/dL (ref 6–20)
CO2: 43 mmol/L — ABNORMAL HIGH (ref 22–32)
Calcium: 8.9 mg/dL (ref 8.9–10.3)
Chloride: 83 mmol/L — ABNORMAL LOW (ref 98–111)
Creatinine, Ser: 0.89 mg/dL (ref 0.44–1.00)
GFR, Estimated: >60 mL/min (ref >60)
Glucose, Bld: 205 mg/dL — ABNORMAL HIGH (ref 70–99)
Potassium: 3.8 mmol/L (ref 3.5–5.1)
Sodium: 139 mmol/L (ref 135–145)

## 2023-09-24 LAB — MAGNESIUM: Magnesium: 2.3 mg/dL (ref 1.7–2.4)

## 2023-09-24 LAB — GLUCOSE, CAPILLARY
Glucose-Capillary: 243 mg/dL — ABNORMAL HIGH (ref 70–99)
Glucose-Capillary: 273 mg/dL — ABNORMAL HIGH (ref 70–99)

## 2023-09-24 MED ORDER — INSULIN GLARGINE-YFGN 100 UNIT/ML ~~LOC~~ SOLN
30.0000 [IU] | Freq: Every day | SUBCUTANEOUS | Status: DC
  Administered 2023-09-24: 30 [IU] via SUBCUTANEOUS
  Filled 2023-09-24: qty 0.3

## 2023-09-24 NOTE — Progress Notes (Addendum)
Called report to [employer] from [facility]. Case social worker notified PTAR of patient's readiness to discharge. PIV removed by this RN. Personal belongings packed at bedside. Waiting for PTAR arrival.  Update 1419: PTAR here to transport patient. AVS and documents placed in discharge packet and given to PTAR. Personal belongings sent with patient.

## 2023-09-24 NOTE — Assessment & Plan Note (Deleted)
Bilateral ground glass opacities on admission.  More likely pulmonary edema than PNA, but completing Abx course today -Complete course of ceftriaxone (day 5/5) and azithromycin (completed 3 days) -Diuresis as above -Will need repeat imaging to evaluate for underlying lung disease outpatient

## 2023-09-24 NOTE — Assessment & Plan Note (Deleted)
Likely primarily driven by CHF exacerbation in setting of limited respiratory reserve 2/2 OSA, OHS, COPD.  Now  stable on home 5 L [place].  Continuing diuresis as below. -Continuous pulse ox -PT eval and treat for discharge planning. -Nightly BiPAP, compliant inpatient -O2 goal 88-92%

## 2023-09-24 NOTE — Social Work (Signed)
[facility], informed patient is stable for d/c - waiting on response  Antony Blackbird, MSW, LCSW Clinical Social Worker

## 2023-09-24 NOTE — Assessment & Plan Note (Deleted)
More euvolemic on exam.  UOP 2.6 L past 24 hours, net negative ~7.8 L since admission.  Difficult to establish dry weight this admission due to inconsistencies in weights, but likely near dry weight.  Continue diuresis and transition to PO. -Transition to home torsemide 60 mg BID -Continue spironolactone 25 mg daily, empagliflozin 25 mg daily -Strict I&Os, daily weights, today's weight inaccurate -Continuous cardiac monitoring -Daily BMP, Mag; replete to K>4, Mg>2 -Not retaining urine on bladder scans, will discontinue today

## 2023-09-24 NOTE — Assessment & Plan Note (Deleted)
Pharmacy tech noted oxybutynin 2.5 mg BID on med rec. -Hold oxybutynin for now given centrally acting off target effects in setting of somnolence -Bladder scans Q8h, I&O cath >300 mL

## 2023-09-24 NOTE — TOC Transition Note (Addendum)
Transition of Care [facility]) - Discharge Note   Patient Details  Name: KAYLANIE WAUGH MRN: [id number] Date of Birth: 12-29-1965  Transition of Care [facility]) CM/SW Contact:  Eduard Roux, LCSW Phone Number: 09/24/2023, 12:02 PM   Clinical Narrative:     Met with patient at bedside. CSW introduced self and explained role. Patient confirmed plan to return to [facility].   Patient will Discharge to: [facility]  Discharge Date: 09/24/2023 Family Notified: patient declined  Transport By: Sharin Mons Please make sure script for oxy goes with the patient.  Per MD patient is ready for discharge. RN, patient, and facility notified of discharge. Discharge Summary sent to facility. RN given number for [phone]. Ambulance transport requested for patient.   Clinical Social Worker signing off.  Antony Blackbird, MSW, LCSW Clinical Social Worker     Final next level of care: Skilled Nursing Facility Barriers to Discharge: Barriers Resolved   Patient Goals and CMS Choice            Discharge Placement              Patient [facility] at: [facility] Patient to be transferred to facility by: [facility] Name of family member notified: patient refused Patient and family notified of of transfer: 09/24/23  Discharge Plan and Services Additional resources added to the After Visit Summary for                                       Social Drivers of Health (SDOH) Interventions SDOH Screenings   Food Insecurity: No Food Insecurity (09/20/2023)  Housing: Low Risk  (08/15/2023)  Recent Concern: Housing - Medium Risk (06/06/2023)  Transportation Needs: No Transportation Needs (09/20/2023)  Utilities: Not At Risk (09/20/2023)  Alcohol Screen: Low Risk  (07/05/2022)  Financial Resource Strain: Low Risk  (07/05/2022)  Physical Activity: Insufficiently Active (03/27/2021)   Received from [facility]  Social Connections: Unknown (02/04/2022)    Received from [facility]  Stress: No Stress Concern Present (09/15/2021)   Received from [facility], [facility]  Tobacco Use: Low Risk  (09/19/2023)     Readmission Risk Interventions    07/23/2022    2:19 PM 04/09/2022    4:17 PM  Readmission Risk Prevention Plan  Transportation Screening Complete Complete  PCP or Specialist Appt within 3-5 Days  Complete  HRI or Home Care Consult  Complete  Social Work Consult for Recovery Care Planning/Counseling  Complete  Palliative Care Screening  Not Applicable  Medication Review Oceanographer) Complete Referral to Pharmacy  PCP or Specialist appointment within 3-5 days of discharge Complete   HRI or Home Care Consult Complete   SW Recovery Care/Counseling Consult Complete   Palliative Care Screening Not Applicable   Skilled Nursing Facility Complete

## 2023-09-24 NOTE — Assessment & Plan Note (Deleted)
A1c 9.3 on 11/15 and poorly controlled. Glucose above goal. Home regimen: Trulicity 0.75 mg weekly, glargine 30 units daily, empagliflozin 25 mg daily -Continue empagliflozin 25 mg daily -Add LAI 15 units daily today -CBGs ACHS -Increase her SSI from moderate to resistant

## 2023-09-24 NOTE — Progress Notes (Signed)
Order to discharge patient. Patient disposition [facility]. Waiting for facility response is barrier to discharge

## 2023-09-24 NOTE — Assessment & Plan Note (Deleted)
Chronic issue.  Do not favor acute exacerbation given that cough is nonproductive.  Minimal wheezing in lungs. -Continue home budesonide nebulizer BID and montelukast 10 mg daily -DuoNebs Q4h PRN for wheezing -O2 goal 88-92%

## 2023-09-24 NOTE — Discharge Summary (Signed)
Family Medicine Teaching [facility] Discharge Summary  Patient name: Chelsea [name] Medical record number: [id number] Date of birth: Apr 26, 1966 Age: 57 y.o. Gender: female Date of Admission: 09/19/2023  Date of Discharge: 09/24/2023 Admitting Physician: Nelia Shi, MD  Primary Care Provider: Pcp, No Consultants: None  Indication for Hospitalization: Acute Hypercapnic Respiratory Failure  Discharge Diagnoses/Problem List:  Principal Problem for Admission: Acute Hypoxic Respiratory Failure 2/2 to CHF exacerbation.  Other Problems addressed during stay:  Principal Problem:   Acute hypoxic on chronic hypercapnic respiratory failure (HCC) Active Problems:   (HFpEF) heart failure with preserved ejection fraction (HCC)   COPD (chronic obstructive pulmonary disease) (HCC)   Acute on chronic diastolic heart failure (HCC)   T2DM (type 2 diabetes mellitus) (HCC)   Urinary retention   Abnormal CT scan of lung    Brief Hospital Course:  Chelsea [name] is a 57 y.o. year old with a history of chronic hypercapnia, hypoxic respiratory failure likely secondary to OSA OHS and COPD HFpEF, type 2 diabetes, a flutter on Eliquis, hypertension and hyperlipidemia who presented with respiratory failure requiring BiPAP and was admitted to the [facility] Medicine Teaching service for acute hypoxic respiratory failure.  Acute hypoxic respiratory failure Patient was somnolent but able to converse on admission.  VBG showed pCO2 of 88.  Patient likely hypercapnic secondary to underlying OSA/OHS and heart failure.  Patient able to wean off BiPAP and oxygen weaned to 5L.  Patient's mentation improved and repeat VBG showed improvement in CO2 from 88 to 33.  Patient does have history of HFpEF, likely has component of acute CHF excebration contributing to clinical presentation.  Patient was volume overloaded, thus was diuresed with 80 IV for two days prior to transitioning to home toresmide 60mg   BID. Patient did have an increase in cough and CT showed findings concerning for multifocal pneumonia.  Patient was started on ceftriaxone and azithromycin on 12/20. Antibiotic course as below.   Antibiotics course Vancomycin 12/19 - 12/20 Cefepime 12/19 CTX 12/20-12/23 Azithromycin 12/20-12/22  Patients dry weight is 148 kg.   Lower extremity erythema Patient has bilateral lower extremity erythema, there was some concern for cellulitis so vancomycin was started.  On upon further investigation, most likely venous stasis so vancomycin was discontinued.  Urinary retention Patient was retaining urine so oxybutynin was discontinued during admission. Consider restarting as needed outpatient.  Other chronic conditions were medically managed with home medications and formulary alternatives as necessary (T2DM, HLD, RLS, GERD, hypothyroidism).  PCP Follow-up Recommendations: Follow-up imaging after completion of antibiotics for resolution of pneumonia Consider restarting oxybutynin as needed   Disposition: [facility]  Discharge Condition: Stable  Discharge Exam:  Vitals:   09/24/23 0741 09/24/23 0839  BP: 103/66   Pulse: 77   Resp: (!) 29   Temp: 98.5 F (36.9 C)   SpO2: 90% 92%   General: Resting in bed, NAD Cardiac: distant heart sounds RRR, no m/r/g Respiratory: difficult to ascertain given body habitus, but NWOB on 5L [place].  GI: Obese, soft, nondistended Extremities: bilateral erythema to mid shin Neuro:A&Ox4, no gross neurological deficits Psych: Appropriate mood and affect   Significant Procedures: N/A  Significant Labs and Imaging:  No results for input(s): "WBC", "HGB", "HCT", "PLT" in the last 48 hours. Recent Labs  Lab 09/22/23 1032 09/23/23 0420 09/24/23 0459  NA  --  139 139  K  --  3.5 3.8  CL  --  82* 83*  CO2  --  44* 43*  GLUCOSE  --  201* 205*  BUN  --  14 14  CREATININE  --  0.83 0.89  CALCIUM  --  8.8* 8.9  MG 1.8 2.1 2.3      Pertinent Imaging  CXR: 1. Patchy airspace disease in the mid to lower lung fields, possible atelectasis or infiltrate. 2. Cardiomegaly.  CT Chest: 1. Bilateral geographic ground-glass and consolidative airspace opacities greatest in the lower lobes. Findings are concerning for multifocal pneumonia. Pulmonary edema could appear similarly. Follow-up is recommended to ensure resolution. 2. Small left pleural effusion. 3. Enlarged main pulmonary artery compatible with pulmonary arterial hypertension.   Aortic Atherosclerosis (ICD10-I70.0).  Results/Tests Pending at Time of Discharge: none  Discharge Medications:  Allergies as of 09/24/2023       Reactions   Iodinated Contrast Media Anaphylaxis   Penicillins Anaphylaxis   Patient tolerates cefepime (08/2017)   Insulin Lispro Other (See Comments)   Adder per MAR from SNF   Minoxidil Hives        Medication List     STOP taking these medications    oxyBUTYnin Chloride 2.5 MG Tabs       TAKE these medications    albuterol (2.5 MG/3ML) 0.083% nebulizer solution Commonly known as: PROVENTIL Take 3 mLs (2.5 mg total) by nebulization every 6 (six) hours as needed for wheezing or shortness of breath (I50.32).   amiodarone 200 MG tablet Commonly known as: PACERONE Take 1 tablet (200 mg total) by mouth daily.   ammonium lactate 12 % lotion Commonly known as: LAC-HYDRIN Apply 1 Application topically at bedtime.   atorvastatin 20 MG tablet Commonly known as: LIPITOR Take 20 mg by mouth every evening.   Basaglar KwikPen 100 UNIT/ML Inject 30 Units into the skin daily.   budesonide 0.5 MG/2ML nebulizer solution Commonly known as: PULMICORT Inhale 0.5 mg into the lungs every 12 (twelve) hours.   buPROPion ER 100 MG 12 hr tablet Commonly known as: WELLBUTRIN SR Take 1 tablet (100 mg total) by mouth 2 (two) times daily.   Cholecalciferol 125 MCG (5000 UT) Tabs Take 1 tablet (5,000 Units total) by mouth daily.    Eliquis 5 MG Tabs tablet Generic drug: apixaban Take 5 mg by mouth 2 (two) times daily.   empagliflozin 25 MG Tabs tablet Commonly known as: JARDIANCE Take 1 tablet (25 mg total) by mouth daily.   ferrous sulfate 325 (65 FE) MG EC tablet Take 325 mg by mouth daily.   gabapentin 400 MG capsule Commonly known as: NEURONTIN Take 400 mg by mouth 3 (three) times daily.   levothyroxine 150 MCG tablet Commonly known as: SYNTHROID Take 1 tablet (150 mcg total) by mouth daily at 6 (six) AM. What changed: when to take this   loperamide 2 MG capsule Commonly known as: IMODIUM Take 2 mg by mouth every 4 (four) hours as needed for diarrhea or loose stools.   melatonin 3 MG Tabs tablet Take 1 tablet (3 mg total) by mouth at bedtime.   montelukast 10 MG tablet Commonly known as: SINGULAIR Take 10 mg by mouth at bedtime.   multivitamin tablet Take 1 tablet by mouth daily.   NON FORMULARY Apply BIPAP every night at bedtime.   nystatin cream Commonly known as: MYCOSTATIN Apply topically 2 (two) times daily.   omeprazole 20 MG capsule Commonly known as: PRILOSEC Take 20 mg by mouth daily.   oxyCODONE-acetaminophen 10-325 MG tablet Commonly known as: PERCOCET Take 1 tablet by mouth every 6 (six) hours.   OXYGEN Inhale 5  L/min into the lungs in the morning and at bedtime. Connect to BIPAP qhs   potassium chloride SA 20 MEQ tablet Commonly known as: KLOR-CON M Take 2 tablets (40 mEq total) by mouth daily.   rOPINIRole 0.5 MG tablet Commonly known as: REQUIP Take 1 tablet (0.5 mg total) by mouth at bedtime.   spironolactone 25 MG tablet Commonly known as: ALDACTONE Take 1 tablet (25 mg total) by mouth daily.   torsemide 20 MG tablet Commonly known as: DEMADEX Take 60 mg by mouth 2 (two) times daily.   Trulicity 0.75 MG/0.5ML Soaj Generic drug: Dulaglutide Inject 0.75 mg into the skin every Saturday.        Discharge Instructions: Please refer to Patient  Instructions section of EMR for full details.  Patient was counseled important signs and symptoms that should prompt return to medical care, changes in medications, dietary instructions, activity restrictions, and follow up appointments.   Follow-Up Appointments:   Penne Lash, MD 09/24/2023, 10:08 AM PGY-1, [facility]

## 2023-09-25 LAB — CULTURE, BLOOD (ROUTINE X 2)
Culture: NO GROWTH
Special Requests: ADEQUATE

## 2023-09-29 ENCOUNTER — Other Ambulatory Visit: Payer: Self-pay

## 2023-09-29 ENCOUNTER — Inpatient Hospital Stay (HOSPITAL_COMMUNITY): Payer: Medicare Other

## 2023-09-29 ENCOUNTER — Emergency Department (HOSPITAL_COMMUNITY): Payer: Medicare Other

## 2023-09-29 ENCOUNTER — Encounter (HOSPITAL_COMMUNITY): Payer: Self-pay

## 2023-09-29 ENCOUNTER — Inpatient Hospital Stay (HOSPITAL_COMMUNITY)
Admission: EM | Admit: 2023-09-29 | Discharge: 2023-10-07 | DRG: 871 | Disposition: A | Discharge status: Skilled Nursing Facility | Payer: Medicare Other | Source: Skilled Nursing Facility | Attending: Family Medicine | Admitting: Family Medicine

## 2023-09-29 DIAGNOSIS — R6521 Severe sepsis with septic shock: Secondary | ICD-10-CM | POA: Diagnosis not present

## 2023-09-29 DIAGNOSIS — J9621 Acute and chronic respiratory failure with hypoxia: Secondary | ICD-10-CM | POA: Diagnosis present

## 2023-09-29 DIAGNOSIS — E1165 Type 2 diabetes mellitus with hyperglycemia: Secondary | ICD-10-CM | POA: Diagnosis not present

## 2023-09-29 DIAGNOSIS — I4892 Unspecified atrial flutter: Secondary | ICD-10-CM | POA: Diagnosis present

## 2023-09-29 DIAGNOSIS — J440 Chronic obstructive pulmonary disease with acute lower respiratory infection: Secondary | ICD-10-CM | POA: Diagnosis present

## 2023-09-29 DIAGNOSIS — Z79899 Other long term (current) drug therapy: Secondary | ICD-10-CM

## 2023-09-29 DIAGNOSIS — E782 Mixed hyperlipidemia: Secondary | ICD-10-CM | POA: Diagnosis present

## 2023-09-29 DIAGNOSIS — Z8541 Personal history of malignant neoplasm of cervix uteri: Secondary | ICD-10-CM | POA: Diagnosis not present

## 2023-09-29 DIAGNOSIS — E1141 Type 2 diabetes mellitus with diabetic mononeuropathy: Secondary | ICD-10-CM | POA: Diagnosis present

## 2023-09-29 DIAGNOSIS — E876 Hypokalemia: Secondary | ICD-10-CM | POA: Diagnosis present

## 2023-09-29 DIAGNOSIS — I5032 Chronic diastolic (congestive) heart failure: Secondary | ICD-10-CM | POA: Diagnosis present

## 2023-09-29 DIAGNOSIS — J189 Pneumonia, unspecified organism: Secondary | ICD-10-CM | POA: Diagnosis present

## 2023-09-29 DIAGNOSIS — J9601 Acute respiratory failure with hypoxia: Secondary | ICD-10-CM | POA: Diagnosis not present

## 2023-09-29 DIAGNOSIS — J9622 Acute and chronic respiratory failure with hypercapnia: Principal | ICD-10-CM | POA: Diagnosis present

## 2023-09-29 DIAGNOSIS — B9562 Methicillin resistant Staphylococcus aureus infection as the cause of diseases classified elsewhere: Secondary | ICD-10-CM | POA: Diagnosis present

## 2023-09-29 DIAGNOSIS — A419 Sepsis, unspecified organism: Secondary | ICD-10-CM | POA: Diagnosis present

## 2023-09-29 DIAGNOSIS — Z7984 Long term (current) use of oral hypoglycemic drugs: Secondary | ICD-10-CM

## 2023-09-29 DIAGNOSIS — E039 Hypothyroidism, unspecified: Secondary | ICD-10-CM | POA: Diagnosis present

## 2023-09-29 DIAGNOSIS — I480 Paroxysmal atrial fibrillation: Secondary | ICD-10-CM | POA: Diagnosis present

## 2023-09-29 DIAGNOSIS — R4182 Altered mental status, unspecified: Secondary | ICD-10-CM | POA: Diagnosis not present

## 2023-09-29 DIAGNOSIS — Z87891 Personal history of nicotine dependence: Secondary | ICD-10-CM | POA: Diagnosis not present

## 2023-09-29 DIAGNOSIS — Z794 Long term (current) use of insulin: Secondary | ICD-10-CM

## 2023-09-29 DIAGNOSIS — G2581 Restless legs syndrome: Secondary | ICD-10-CM | POA: Diagnosis present

## 2023-09-29 DIAGNOSIS — E662 Morbid (severe) obesity with alveolar hypoventilation: Secondary | ICD-10-CM | POA: Diagnosis present

## 2023-09-29 DIAGNOSIS — F419 Anxiety disorder, unspecified: Secondary | ICD-10-CM | POA: Diagnosis present

## 2023-09-29 DIAGNOSIS — I2510 Atherosclerotic heart disease of native coronary artery without angina pectoris: Secondary | ICD-10-CM | POA: Diagnosis present

## 2023-09-29 DIAGNOSIS — R579 Shock, unspecified: Secondary | ICD-10-CM

## 2023-09-29 DIAGNOSIS — G9341 Metabolic encephalopathy: Secondary | ICD-10-CM | POA: Diagnosis present

## 2023-09-29 DIAGNOSIS — Z6841 Body Mass Index (BMI) 40.0 and over, adult: Secondary | ICD-10-CM

## 2023-09-29 DIAGNOSIS — Z79890 Hormone replacement therapy (postmenopausal): Secondary | ICD-10-CM | POA: Diagnosis not present

## 2023-09-29 DIAGNOSIS — G894 Chronic pain syndrome: Secondary | ICD-10-CM | POA: Diagnosis present

## 2023-09-29 DIAGNOSIS — Z7901 Long term (current) use of anticoagulants: Secondary | ICD-10-CM

## 2023-09-29 DIAGNOSIS — R569 Unspecified convulsions: Secondary | ICD-10-CM | POA: Diagnosis not present

## 2023-09-29 DIAGNOSIS — I878 Other specified disorders of veins: Secondary | ICD-10-CM | POA: Diagnosis present

## 2023-09-29 DIAGNOSIS — Z880 Allergy status to penicillin: Secondary | ICD-10-CM

## 2023-09-29 DIAGNOSIS — Z91041 Radiographic dye allergy status: Secondary | ICD-10-CM

## 2023-09-29 DIAGNOSIS — K219 Gastro-esophageal reflux disease without esophagitis: Secondary | ICD-10-CM | POA: Diagnosis present

## 2023-09-29 DIAGNOSIS — G9340 Encephalopathy, unspecified: Secondary | ICD-10-CM | POA: Diagnosis not present

## 2023-09-29 DIAGNOSIS — R400 Somnolence: Secondary | ICD-10-CM | POA: Diagnosis present

## 2023-09-29 DIAGNOSIS — Z8249 Family history of ischemic heart disease and other diseases of the circulatory system: Secondary | ICD-10-CM

## 2023-09-29 DIAGNOSIS — Z79891 Long term (current) use of opiate analgesic: Secondary | ICD-10-CM

## 2023-09-29 DIAGNOSIS — Z888 Allergy status to other drugs, medicaments and biological substances status: Secondary | ICD-10-CM

## 2023-09-29 DIAGNOSIS — F32A Depression, unspecified: Secondary | ICD-10-CM | POA: Diagnosis present

## 2023-09-29 HISTORY — DX: Metabolic encephalopathy: G93.41

## 2023-09-29 LAB — I-STAT VENOUS BLOOD GAS, ED
Acid-Base Excess: 6 mmol/L — ABNORMAL HIGH (ref 0.0–2.0)
Acid-Base Excess: 10 mmol/L — ABNORMAL HIGH (ref 0.0–2.0)
Bicarbonate: 35.5 mmol/L — ABNORMAL HIGH (ref 20.0–28.0)
Bicarbonate: 38.5 mmol/L — ABNORMAL HIGH (ref 20.0–28.0)
Calcium, Ion: 1.02 mmol/L — ABNORMAL LOW (ref 1.15–1.40)
Calcium, Ion: 1.02 mmol/L — ABNORMAL LOW (ref 1.15–1.40)
HCT: 38 % (ref 36.0–46.0)
HCT: 35 % — ABNORMAL LOW (ref 36.0–46.0)
Hemoglobin: 12.9 g/dL (ref 12.0–15.0)
Hemoglobin: 11.9 g/dL — ABNORMAL LOW (ref 12.0–15.0)
O2 Saturation: 96 %
O2 Saturation: 99 %
Potassium: 5.1 mmol/L (ref 3.5–5.1)
Potassium: 4.5 mmol/L (ref 3.5–5.1)
Sodium: 136 mmol/L (ref 135–145)
Sodium: 138 mmol/L (ref 135–145)
TCO2: 38 mmol/L — ABNORMAL HIGH (ref 22–32)
TCO2: 41 mmol/L — ABNORMAL HIGH (ref 22–32)
pCO2, Ven: 76.2 mm[Hg] (ref 44–60)
pCO2, Ven: 74.1 mm[Hg] (ref 44–60)
pH, Ven: 7.277 (ref 7.25–7.43)
pH, Ven: 7.324 (ref 7.25–7.43)
pO2, Ven: 94 mm[Hg] — ABNORMAL HIGH (ref 32–45)
pO2, Ven: 143 mm[Hg] — ABNORMAL HIGH (ref 32–45)

## 2023-09-29 LAB — COMPREHENSIVE METABOLIC PANEL
ALT: 24 U/L (ref 0–44)
AST: 50 U/L — ABNORMAL HIGH (ref 15–41)
Albumin: 3 g/dL — ABNORMAL LOW (ref 3.5–5.0)
Alkaline Phosphatase: 121 U/L (ref 38–126)
Anion gap: 12 (ref 5–15)
BUN: 24 mg/dL — ABNORMAL HIGH (ref 6–20)
CO2: 29 mmol/L (ref 22–32)
Calcium: 8.4 mg/dL — ABNORMAL LOW (ref 8.9–10.3)
Chloride: 94 mmol/L — ABNORMAL LOW (ref 98–111)
Creatinine, Ser: 1.18 mg/dL — ABNORMAL HIGH (ref 0.44–1.00)
GFR, Estimated: 53 mL/min — ABNORMAL LOW (ref >60)
Glucose, Bld: 433 mg/dL — ABNORMAL HIGH (ref 70–99)
Potassium: 5.2 mmol/L — ABNORMAL HIGH (ref 3.5–5.1)
Sodium: 135 mmol/L (ref 135–145)
Total Bilirubin: 0.4 mg/dL (ref <1.2)
Total Protein: 7.6 g/dL (ref 6.5–8.1)

## 2023-09-29 LAB — TROPONIN I (HIGH SENSITIVITY)
Troponin I (High Sensitivity): 7 ng/L (ref <18)
Troponin I (High Sensitivity): 12 ng/L (ref <18)

## 2023-09-29 LAB — AMMONIA: Ammonia: 51 umol/L — ABNORMAL HIGH (ref 9–35)

## 2023-09-29 LAB — CBC WITH DIFFERENTIAL/PLATELET
Abs Immature Granulocytes: 0.18 10*3/uL — ABNORMAL HIGH (ref 0.00–0.07)
Basophils Absolute: 0.1 10*3/uL (ref 0.0–0.1)
Basophils Relative: 1 %
Eosinophils Absolute: 0.1 10*3/uL (ref 0.0–0.5)
Eosinophils Relative: 0 %
HCT: 39.4 % (ref 36.0–46.0)
Hemoglobin: 11.5 g/dL — ABNORMAL LOW (ref 12.0–15.0)
Immature Granulocytes: 1 %
Lymphocytes Relative: 8 %
Lymphs Abs: 1.2 10*3/uL (ref 0.7–4.0)
MCH: 27 pg (ref 26.0–34.0)
MCHC: 29.2 g/dL — ABNORMAL LOW (ref 30.0–36.0)
MCV: 92.5 fL (ref 80.0–100.0)
Monocytes Absolute: 0.7 10*3/uL (ref 0.1–1.0)
Monocytes Relative: 5 %
Neutro Abs: 12.6 10*3/uL — ABNORMAL HIGH (ref 1.7–7.7)
Neutrophils Relative %: 85 %
Platelets: 301 10*3/uL (ref 150–400)
RBC: 4.26 MIL/uL (ref 3.87–5.11)
RDW: 14.9 % (ref 11.5–15.5)
WBC: 14.8 10*3/uL — ABNORMAL HIGH (ref 4.0–10.5)
nRBC: 0.1 % (ref 0.0–0.2)

## 2023-09-29 LAB — T4, FREE: Free T4: 1.01 ng/dL (ref 0.61–1.12)

## 2023-09-29 LAB — I-STAT CG4 LACTIC ACID, ED
Lactic Acid, Venous: 3.1 mmol/L (ref 0.5–1.9)
Lactic Acid, Venous: 2.5 mmol/L (ref 0.5–1.9)

## 2023-09-29 LAB — BLOOD GAS, VENOUS
Acid-Base Excess: 9 mmol/L — ABNORMAL HIGH (ref 0.0–2.0)
Bicarbonate: 37.4 mmol/L — ABNORMAL HIGH (ref 20.0–28.0)
O2 Saturation: 77.7 %
Patient temperature: 37.8
pCO2, Ven: 74 mm[Hg] (ref 44–60)
pH, Ven: 7.32 (ref 7.25–7.43)
pO2, Ven: 45 mm[Hg] (ref 32–45)

## 2023-09-29 LAB — TSH: TSH: 1.562 u[IU]/mL (ref 0.350–4.500)

## 2023-09-29 LAB — URINALYSIS, ROUTINE W REFLEX MICROSCOPIC
Bilirubin Urine: NEGATIVE
Glucose, UA: >=500 mg/dL — AB
Hgb urine dipstick: NEGATIVE
Ketones, ur: NEGATIVE mg/dL
Leukocytes,Ua: NEGATIVE
Nitrite: NEGATIVE
Protein, ur: 30 mg/dL — AB
Specific Gravity, Urine: 1.013 (ref 1.005–1.030)
pH: 5 (ref 5.0–8.0)

## 2023-09-29 LAB — I-STAT CHEM 8, ED
BUN: 31 mg/dL — ABNORMAL HIGH (ref 6–20)
Calcium, Ion: 1.03 mmol/L — ABNORMAL LOW (ref 1.15–1.40)
Chloride: 95 mmol/L — ABNORMAL LOW (ref 98–111)
Creatinine, Ser: 1.3 mg/dL — ABNORMAL HIGH (ref 0.44–1.00)
Glucose, Bld: 437 mg/dL — ABNORMAL HIGH (ref 70–99)
HCT: 39 % (ref 36.0–46.0)
Hemoglobin: 13.3 g/dL (ref 12.0–15.0)
Potassium: 5.1 mmol/L (ref 3.5–5.1)
Sodium: 136 mmol/L (ref 135–145)
TCO2: 34 mmol/L — ABNORMAL HIGH (ref 22–32)

## 2023-09-29 LAB — RAPID URINE DRUG SCREEN, HOSP PERFORMED
Amphetamines: NOT DETECTED
Barbiturates: NOT DETECTED
Benzodiazepines: NOT DETECTED
Cocaine: NOT DETECTED
Opiates: NOT DETECTED
Tetrahydrocannabinol: NOT DETECTED

## 2023-09-29 LAB — CBG MONITORING, ED: Glucose-Capillary: 375 mg/dL — ABNORMAL HIGH (ref 70–99)

## 2023-09-29 LAB — PROCALCITONIN: Procalcitonin: 0.17 ng/mL

## 2023-09-29 LAB — GLUCOSE, CAPILLARY
Glucose-Capillary: 203 mg/dL — ABNORMAL HIGH (ref 70–99)
Glucose-Capillary: 217 mg/dL — ABNORMAL HIGH (ref 70–99)

## 2023-09-29 LAB — BRAIN NATRIURETIC PEPTIDE: B Natriuretic Peptide: 185.1 pg/mL — ABNORMAL HIGH (ref 0.0–100.0)

## 2023-09-29 LAB — MRSA NEXT GEN BY PCR, NASAL: MRSA by PCR Next Gen: DETECTED — AB

## 2023-09-29 MED ORDER — ORAL CARE MOUTH RINSE
15.0000 mL | OROMUCOSAL | Status: DC
  Administered 2023-09-29 – 2023-10-01 (×22): 15 mL via OROMUCOSAL

## 2023-09-29 MED ORDER — GABAPENTIN 250 MG/5ML PO SOLN
100.0000 mg | Freq: Three times a day (TID) | ORAL | Status: DC
  Administered 2023-09-29 – 2023-09-30 (×2): 100 mg
  Filled 2023-09-29 (×4): qty 2

## 2023-09-29 MED ORDER — NOREPINEPHRINE 4 MG/250ML-% IV SOLN
2.0000 ug/min | INTRAVENOUS | Status: DC
  Administered 2023-09-29: 2 ug/min via INTRAVENOUS
  Administered 2023-09-30: 4 ug/min via INTRAVENOUS
  Filled 2023-09-29 (×2): qty 250

## 2023-09-29 MED ORDER — ROCURONIUM BROMIDE 10 MG/ML (PF) SYRINGE
PREFILLED_SYRINGE | INTRAVENOUS | Status: AC | PRN
  Administered 2023-09-29: 150 mg via INTRAVENOUS

## 2023-09-29 MED ORDER — DOCUSATE SODIUM 100 MG PO CAPS: 100.0000 mg | ORAL_CAPSULE | Freq: Two times a day (BID) | ORAL | Status: DC | PRN

## 2023-09-29 MED ORDER — POLYETHYLENE GLYCOL 3350 17 G PO PACK
17.0000 g | PACK | Freq: Every day | ORAL | Status: DC
  Administered 2023-09-30 – 2023-10-01 (×2): 17 g
  Filled 2023-09-29 (×3): qty 1

## 2023-09-29 MED ORDER — ETOMIDATE 2 MG/ML IV SOLN
INTRAVENOUS | Status: AC | PRN
  Administered 2023-09-29: 40 mg via INTRAVENOUS

## 2023-09-29 MED ORDER — GABAPENTIN 100 MG PO CAPS: 100.0000 mg | ORAL_CAPSULE | Freq: Three times a day (TID) | ORAL | Status: DC

## 2023-09-29 MED ORDER — LACTATED RINGERS IV BOLUS
1000.0000 mL | Freq: Once | INTRAVENOUS | Status: AC
  Administered 2023-09-29: 1000 mL via INTRAVENOUS

## 2023-09-29 MED ORDER — DOCUSATE SODIUM 50 MG/5ML PO LIQD
100.0000 mg | Freq: Two times a day (BID) | ORAL | Status: DC
  Administered 2023-09-29 – 2023-10-01 (×4): 100 mg
  Filled 2023-09-29 (×5): qty 10

## 2023-09-29 MED ORDER — DEXMEDETOMIDINE HCL IN NACL 400 MCG/100ML IV SOLN
0.0000 ug/kg/h | INTRAVENOUS | Status: DC
  Administered 2023-09-29: 0.4 ug/kg/h via INTRAVENOUS
  Administered 2023-09-29: 0.6 ug/kg/h via INTRAVENOUS
  Administered 2023-09-30: 0.5 ug/kg/h via INTRAVENOUS
  Administered 2023-09-30 (×2): 0.4 ug/kg/h via INTRAVENOUS
  Administered 2023-09-30: 0.3 ug/kg/h via INTRAVENOUS
  Filled 2023-09-29 (×6): qty 100

## 2023-09-29 MED ORDER — PANTOPRAZOLE SODIUM 40 MG IV SOLR
40.0000 mg | Freq: Every day | INTRAVENOUS | Status: DC
  Administered 2023-09-29 – 2023-09-30 (×2): 40 mg via INTRAVENOUS
  Filled 2023-09-29 (×2): qty 10

## 2023-09-29 MED ORDER — FENTANYL CITRATE PF 50 MCG/ML IJ SOSY
50.0000 ug | PREFILLED_SYRINGE | Freq: Once | INTRAMUSCULAR | Status: AC
Start: 2023-09-29 — End: 2023-09-29
  Administered 2023-09-29: 50 ug via INTRAVENOUS

## 2023-09-29 MED ORDER — MONTELUKAST SODIUM 10 MG PO TABS
10.0000 mg | ORAL_TABLET | Freq: Every day | ORAL | Status: DC
  Administered 2023-09-29 – 2023-09-30 (×2): 10 mg
  Filled 2023-09-29 (×2): qty 1

## 2023-09-29 MED ORDER — BUDESONIDE 0.5 MG/2ML IN SUSP
0.5000 mg | Freq: Two times a day (BID) | RESPIRATORY_TRACT | Status: DC
  Administered 2023-09-29 – 2023-10-06 (×16): 0.5 mg via RESPIRATORY_TRACT
  Filled 2023-09-29 (×17): qty 2

## 2023-09-29 MED ORDER — FENTANYL BOLUS VIA INFUSION
50.0000 ug | INTRAVENOUS | Status: DC | PRN
  Administered 2023-09-29: 100 ug via INTRAVENOUS
  Administered 2023-09-29 (×2): 50 ug via INTRAVENOUS

## 2023-09-29 MED ORDER — INSULIN ASPART 100 UNIT/ML IJ SOLN
0.0000 [IU] | INTRAMUSCULAR | Status: DC
  Administered 2023-09-29 (×2): 7 [IU] via SUBCUTANEOUS
  Administered 2023-09-30 (×5): 4 [IU] via SUBCUTANEOUS
  Administered 2023-09-30: 3 [IU] via SUBCUTANEOUS
  Administered 2023-10-01 (×2): 4 [IU] via SUBCUTANEOUS
  Administered 2023-10-01: 7 [IU] via SUBCUTANEOUS
  Administered 2023-10-01: 3 [IU] via SUBCUTANEOUS
  Administered 2023-10-02: 7 [IU] via SUBCUTANEOUS
  Administered 2023-10-02: 3 [IU] via SUBCUTANEOUS
  Administered 2023-10-02: 7 [IU] via SUBCUTANEOUS
  Administered 2023-10-02: 4 [IU] via SUBCUTANEOUS
  Administered 2023-10-02: 7 [IU] via SUBCUTANEOUS
  Administered 2023-10-03: 4 [IU] via SUBCUTANEOUS
  Administered 2023-10-03: 3 [IU] via SUBCUTANEOUS

## 2023-09-29 MED ORDER — SODIUM CHLORIDE 0.9 % IV SOLN
500.0000 mg | Freq: Once | INTRAVENOUS | Status: AC
  Administered 2023-09-29: 500 mg via INTRAVENOUS
  Filled 2023-09-29: qty 5

## 2023-09-29 MED ORDER — AMIODARONE HCL 200 MG PO TABS
200.0000 mg | ORAL_TABLET | Freq: Every day | ORAL | Status: DC
  Administered 2023-09-30 – 2023-10-01 (×2): 200 mg
  Filled 2023-09-29 (×2): qty 1

## 2023-09-29 MED ORDER — ORAL CARE MOUTH RINSE: 15.0000 mL | OROMUCOSAL | Status: DC | PRN

## 2023-09-29 MED ORDER — POLYETHYLENE GLYCOL 3350 17 G PO PACK
17.0000 g | PACK | Freq: Every day | ORAL | Status: DC | PRN
Start: 2023-09-29 — End: 2023-10-01

## 2023-09-29 MED ORDER — ATORVASTATIN CALCIUM 10 MG PO TABS
20.0000 mg | ORAL_TABLET | Freq: Every evening | ORAL | Status: DC
  Administered 2023-09-29 – 2023-09-30 (×2): 20 mg
  Filled 2023-09-29 (×2): qty 2

## 2023-09-29 MED ORDER — SODIUM CHLORIDE 0.9 % IV SOLN
2.0000 g | Freq: Once | INTRAVENOUS | Status: AC
  Administered 2023-09-29: 2 g via INTRAVENOUS
  Filled 2023-09-29: qty 20

## 2023-09-29 MED ORDER — SODIUM CHLORIDE 0.9 % IV SOLN
250.0000 mL | INTRAVENOUS | Status: DC
Start: 2023-09-29 — End: 2023-09-30

## 2023-09-29 MED ORDER — SODIUM CHLORIDE 0.9 % IV SOLN
2.0000 g | INTRAVENOUS | Status: AC
  Administered 2023-09-30 – 2023-10-03 (×4): 2 g via INTRAVENOUS
  Filled 2023-09-29 (×4): qty 20

## 2023-09-29 MED ORDER — FENTANYL 2500MCG IN NS 250ML (10MCG/ML) PREMIX INFUSION
50.0000 ug/h | INTRAVENOUS | Status: DC
  Administered 2023-09-29: 50 ug/h via INTRAVENOUS
  Filled 2023-09-29 (×2): qty 250

## 2023-09-29 MED ORDER — INSULIN GLARGINE-YFGN 100 UNIT/ML ~~LOC~~ SOLN
30.0000 [IU] | Freq: Every day | SUBCUTANEOUS | Status: DC
  Administered 2023-09-29 – 2023-10-02 (×4): 30 [IU] via SUBCUTANEOUS
  Filled 2023-09-29 (×5): qty 0.3

## 2023-09-29 MED ORDER — LACTATED RINGERS IV SOLN: INTRAVENOUS | Status: DC

## 2023-09-29 MED ORDER — MIDAZOLAM HCL 2 MG/2ML IJ SOLN
1.0000 mg | INTRAMUSCULAR | Status: DC | PRN
  Administered 2023-10-01 (×4): 1 mg via INTRAVENOUS
  Filled 2023-09-29 (×4): qty 2

## 2023-09-29 MED ORDER — ROPINIROLE HCL 0.5 MG PO TABS
0.5000 mg | ORAL_TABLET | Freq: Every day | ORAL | Status: DC
  Administered 2023-09-29 – 2023-09-30 (×2): 0.5 mg
  Filled 2023-09-29 (×2): qty 1

## 2023-09-29 MED ORDER — LEVOTHYROXINE SODIUM 25 MCG PO TABS
150.0000 ug | ORAL_TABLET | Freq: Every day | ORAL | Status: DC
  Administered 2023-09-30 – 2023-10-01 (×2): 150 ug
  Filled 2023-09-29 (×3): qty 2

## 2023-09-29 MED ORDER — LACTULOSE 10 GM/15ML PO SOLN
10.0000 g | Freq: Two times a day (BID) | ORAL | Status: DC
  Administered 2023-09-29 – 2023-10-01 (×4): 10 g
  Filled 2023-09-29 (×4): qty 15

## 2023-09-29 NOTE — IPAL (Signed)
Patient has had trach in past and would prefer to avoid getting another per HCPOA.  He is willing to revisit once she is extubated and we discuss the risks of recurrent hospitalizations like the current one.    Lorin Glass, MD  09/29/2023, 4:27 PM

## 2023-09-29 NOTE — ED Provider Notes (Signed)
[place] EMERGENCY DEPARTMENT AT [facility] Provider Note   CSN: [id number] Arrival date & time: 09/29/23  1244     History  Chief Complaint  Patient presents with   unresponsive    Chelsea [name] is a 57 y.o. female.  57 yo F with a chief complaints of difficulty breathing.  The patient has been living at a skilled nursing facility and per EMS report has been constantly on CPAP.  She was noted to be acutely altered today and was not on her CPAP machine.  EMS was then called.  OPA was placed.  Patient was bagged en route.  Unable to establish IV access.  Level 5 caveat altered mental status.        Home Medications Prior to Admission medications   Medication Sig Start Date End Date Taking? Authorizing Provider  albuterol (PROVENTIL) (2.5 MG/3ML) 0.083% nebulizer solution Take 3 mLs (2.5 mg total) by nebulization every 6 (six) hours as needed for wheezing or shortness of breath (I50.32). 07/04/16   Oretha Milch, MD  amiodarone (PACERONE) 200 MG tablet Take 1 tablet (200 mg total) by mouth daily. 06/21/23   Alfredo Martinez, MD  ammonium lactate (LAC-HYDRIN) 12 % lotion Apply 1 Application topically at bedtime.    [provider]  atorvastatin (LIPITOR) 20 MG tablet Take 20 mg by mouth every evening.    [provider]  budesonide (PULMICORT) 0.5 MG/2ML nebulizer solution Inhale 0.5 mg into the lungs every 12 (twelve) hours.    [provider]  buPROPion ER (WELLBUTRIN SR) 100 MG 12 hr tablet Take 1 tablet (100 mg total) by mouth 2 (two) times daily. 06/08/23   Alfredo Martinez, MD  Cholecalciferol 125 MCG (5000 UT) TABS Take 1 tablet (5,000 Units total) by mouth daily. 07/10/22   [name], Werner Lean, MD  Dulaglutide (TRULICITY) 0.75 MG/0.5ML SOAJ Inject 0.75 mg into the skin every Saturday.    [provider]  ELIQUIS 5 MG TABS tablet Take 5 mg by mouth 2 (two) times daily. 04/12/21   [provider]  empagliflozin  (JARDIANCE) 25 MG TABS tablet Take 1 tablet (25 mg total) by mouth daily. 08/20/23   Celine Mans, MD  ferrous sulfate 325 (65 FE) MG EC tablet Take 325 mg by mouth daily.    [provider]  gabapentin (NEURONTIN) 400 MG capsule Take 400 mg by mouth 3 (three) times daily.    [provider]  Insulin Glargine (BASAGLAR KWIKPEN) 100 UNIT/ML Inject 30 Units into the skin daily. 08/21/23   Celine Mans, MD  levothyroxine (SYNTHROID) 150 MCG tablet Take 1 tablet (150 mcg total) by mouth daily at 6 (six) AM. Patient taking differently: Take 150 mcg by mouth daily. 07/10/22   [name], Werner Lean, MD  loperamide (IMODIUM) 2 MG capsule Take 2 mg by mouth every 4 (four) hours as needed for diarrhea or loose stools.    [provider]  melatonin 3 MG TABS tablet Take 1 tablet (3 mg total) by mouth at bedtime. 08/19/23   Celine Mans, MD  montelukast (SINGULAIR) 10 MG tablet Take 10 mg by mouth at bedtime.    [provider]  Multiple Vitamin (MULTIVITAMIN) tablet Take 1 tablet by mouth daily. Patient not taking: Reported on 08/15/2023    [provider]  NON FORMULARY Apply BIPAP every night at bedtime.    [provider]  nystatin cream (MYCOSTATIN) Apply topically 2 (two) times daily. Patient not taking: Reported on 08/15/2023 06/24/23  Alfredo Martinez, MD  omeprazole (PRILOSEC) 20 MG capsule Take 20 mg by mouth daily. 10/02/22   [provider]  oxyCODONE-acetaminophen (PERCOCET) 10-325 MG tablet Take 1 tablet by mouth every 6 (six) hours. 06/21/23   Alfredo Martinez, MD  OXYGEN Inhale 5 L/min into the lungs in the morning and at bedtime. Connect to BIPAP qhs    [provider]  potassium chloride SA (KLOR-CON M) 20 MEQ tablet Take 2 tablets (40 mEq total) by mouth daily. 06/21/23   Alfredo Martinez, MD  rOPINIRole (REQUIP) 0.5 MG tablet Take 1 tablet (0.5 mg total) by mouth at bedtime. 06/21/23   Alfredo Martinez, MD  spironolactone  (ALDACTONE) 25 MG tablet Take 1 tablet (25 mg total) by mouth daily. 10/17/22   Robbie Lis M, PA-C  torsemide (DEMADEX) 20 MG tablet Take 60 mg by mouth 2 (two) times daily.    [provider]      Allergies    Iodinated contrast media, Penicillins, Insulin lispro, and Minoxidil    Review of Systems   Review of Systems  Physical Exam Updated Vital Signs BP 109/67   Pulse (!) 102   Temp (!) 97 F (36.1 C) (Temporal)   Resp (!) 23   Ht 5\' 5"  (1.651 m)   Wt (!) 150 kg   SpO2 94%   BMI 55.03 kg/m  Physical Exam Vitals and nursing note reviewed.  Constitutional:      General: She is not in acute distress.    Appearance: She is well-developed. She is not diaphoretic.     Comments: Morbid obesity  HENT:     Head: Normocephalic and atraumatic.  Eyes:     Pupils: Pupils are equal, round, and reactive to light.  Cardiovascular:     Rate and Rhythm: Normal rate and regular rhythm.     Heart sounds: No murmur heard.    No friction rub. No gallop.  Pulmonary:     Effort: Pulmonary effort is normal.     Breath sounds: No wheezing or rales.  Abdominal:     General: There is no distension.     Palpations: Abdomen is soft.     Tenderness: There is no abdominal tenderness.  Musculoskeletal:        General: No tenderness.     Cervical back: Normal range of motion and neck supple.  Skin:    General: Skin is warm and dry.  Neurological:     Comments: Patient for raises her eyebrows to sternal rub.  Otherwise has no obvious purposeful movements. OPA in place  Psychiatric:        Behavior: Behavior normal.     ED Results / Procedures / Treatments   Labs (all labs ordered are listed, but only abnormal results are displayed) Labs Reviewed  CBC WITH DIFFERENTIAL/PLATELET - Abnormal; Notable for the following components:      Result Value   WBC 14.8 (*)    Hemoglobin 11.5 (*)    MCHC 29.2 (*)    Neutro Abs 12.6 (*)    Abs Immature Granulocytes 0.18 (*)    All  other components within normal limits  COMPREHENSIVE METABOLIC PANEL - Abnormal; Notable for the following components:   Potassium 5.2 (*)    Chloride 94 (*)    Glucose, Bld 433 (*)    BUN 24 (*)    Creatinine, Ser 1.18 (*)    Calcium 8.4 (*)    Albumin 3.0 (*)    AST 50 (*)  GFR, Estimated 53 (*)    All other components within normal limits  I-STAT CG4 LACTIC ACID, ED - Abnormal; Notable for the following components:   Lactic Acid, Venous 3.1 (*)    All other components within normal limits  CBG MONITORING, ED - Abnormal; Notable for the following components:   Glucose-Capillary 375 (*)    All other components within normal limits  I-STAT CHEM 8, ED - Abnormal; Notable for the following components:   Chloride 95 (*)    BUN 31 (*)    Creatinine, Ser 1.30 (*)    Glucose, Bld 437 (*)    Calcium, Ion 1.03 (*)    TCO2 34 (*)    All other components within normal limits  I-STAT VENOUS BLOOD GAS, ED - Abnormal; Notable for the following components:   pCO2, Ven 76.2 (*)    pO2, Ven 94 (*)    Bicarbonate 35.5 (*)    TCO2 38 (*)    Acid-Base Excess 6.0 (*)    Calcium, Ion 1.02 (*)    All other components within normal limits  BRAIN NATRIURETIC PEPTIDE  RAPID URINE DRUG SCREEN, HOSP PERFORMED  URINALYSIS, ROUTINE W REFLEX MICROSCOPIC  TSH  T4, FREE  AMMONIA  URINE DRUGS OF ABUSE SCREEN W ALC, ROUTINE (REF LAB)  I-STAT CG4 LACTIC ACID, ED  TROPONIN I (HIGH SENSITIVITY)    EKG EKG Interpretation Date/Time:  Sunday September 29 2023 12:55:14 EST Ventricular Rate:  101 PR Interval:  187 QRS Duration:  107 QT Interval:  366 QTC Calculation: 475 R Axis:   77  Text Interpretation: Sinus tachycardia Borderline repolarization abnormality No significant change since last tracing Confirmed by Melene Plan [phone]) on 09/29/2023 1:33:27 PM  Radiology DG Abd Portable 1V Result Date: 09/29/2023 CLINICAL DATA:  [id number] Encounter for imaging study to confirm orogastric (OG) tube  placement [id number] EXAM: PORTABLE ABDOMEN - 1 VIEW COMPARISON:  None Available. FINDINGS: The bowel gas pattern is non-obstructive. No evidence of pneumoperitoneum, within the limitations of a supine film. No acute osseous abnormalities. The soft tissues are within normal limits. Surgical changes, devices, tubes and lines: Enteric tube is seen with its side hole overlying the left upper quadrant, within the distal stomach. The tip is out of the field of view. IMPRESSION: *Nonobstructive bowel gas pattern. Enteric tube is partially seen but appears in satisfactory position. Electronically Signed   By: Jules Schick M.D.   On: 09/29/2023 13:57   DG Chest Portable 1 View Result Date: 09/29/2023 CLINICAL DATA:  Shortness of breath. EXAM: PORTABLE CHEST 1 VIEW COMPARISON:  09/19/2023. FINDINGS: Low lung volume. There are nonspecific opacities throughout bilateral lungs, which appears similar to the prior study and corresponds to patchy areas of atelectasis/scarring on the prior CT scan from 09/19/2023. No acute consolidation or lung collapse. Bilateral costophrenic angles are clear. Stable cardio-mediastinal silhouette. No acute osseous abnormalities. The soft tissues are within normal limits. Endotracheal tube is seen with its tip approximately 3 cm above the carina. Enteric tube is seen coursing below the left hemidiaphragm however, the tip is out of the field of view. IMPRESSION: Low lung volume.  Essentially stable exam. Electronically Signed   By: Jules Schick M.D.   On: 09/29/2023 13:55    Procedures Procedure Name: Intubation Date/Time: 09/29/2023 1:35 PM  Performed by: Melene Plan, DOPre-anesthesia Checklist: Patient identified, Patient being monitored, Emergency Drugs available, Timeout performed and Suction available Oxygen Delivery Method: Ambu bag Preoxygenation: Pre-oxygenation with 100% oxygen Induction Type: Rapid sequence Ventilation:  Mask ventilation without difficulty Laryngoscope Size:  Glidescope Grade View: Grade I Tube size: 7.5 mm Number of attempts: 1 Airway Equipment and Method: Video-laryngoscopy and Patient positioned with wedge pillow Placement Confirmation: ETT inserted through vocal cords under direct vision, CO2 detector and Breath sounds checked- equal and bilateral Secured at: 22 cm Tube secured with: ETT holder Dental Injury: Teeth and Oropharynx as per pre-operative assessment  Difficulty Due To: Difficulty was anticipated, Difficult Airway- due to large tongue, Difficult Airway- due to limited oral opening and Difficult Airway- due to anterior larynx Future Recommendations: Recommend- induction with short-acting agent, and alternative techniques readily available    .Critical Care  Performed by: Melene Plan, DO Authorized by: Melene Plan, DO   Critical care provider statement:    Critical care time (minutes):  80   Critical care time was exclusive of:  Separately billable procedures and treating other patients   Critical care was time spent personally by me on the following activities:  Development of treatment plan with patient or surrogate, discussions with consultants, evaluation of patient's response to treatment, examination of patient, ordering and review of laboratory studies, ordering and review of radiographic studies, ordering and performing treatments and interventions, pulse oximetry, re-evaluation of patient's condition and review of old charts   Care discussed with: admitting provider      Procedure note: Ultrasound Guided Peripheral IV Ultrasound guided peripheral 1.88 inch angiocath IV placement performed by me. Indications: Nursing unable to place IV. Details: The antecubital fossa and upper arm were evaluated with a multifrequency linear probe. Patent brachial veins were noted. 1 attempt was made to cannulate a vein under realtime US guidance with successful cannulation of the vein and catheter placement. There is return of non-pulsatile  dark red blood. The patient tolerated the procedure well without complications. Images archived electronically.  CPT codes: 16109 and [phone]    Medications Ordered in ED Medications  etomidate (AMIDATE) injection (40 mg Intravenous Given 09/29/23 1312)  rocuronium (ZEMURON) injection (150 mg Intravenous Given 09/29/23 1312)  docusate (COLACE) 50 MG/5ML liquid 100 mg (has no administration in time range)  polyethylene glycol (MIRALAX / GLYCOLAX) packet 17 g (has no administration in time range)  fentaNYL (SUBLIMAZE) injection 50 mcg (has no administration in time range)  fentaNYL in NS (45mcg/ml) infusion-PREMIX (50 mcg/hr Intravenous New Bag/Given 09/29/23 1348)  fentaNYL (SUBLIMAZE) bolus via infusion 50-100 mcg (has no administration in time range)  midazolam (VERSED) injection 1-2 mg (has no administration in time range)  cefTRIAXone (ROCEPHIN) 2 g in sodium chloride 0.9 % 100 mL IVPB (2 g Intravenous New Bag/Given 09/29/23 1413)  azithromycin (ZITHROMAX) 500 mg in sodium chloride 0.9 % 250 mL IVPB (has no administration in time range)  docusate sodium (COLACE) capsule 100 mg (has no administration in time range)  polyethylene glycol (MIRALAX / GLYCOLAX) packet 17 g (has no administration in time range)  insulin aspart (novoLOG) injection 0-20 Units (has no administration in time range)    ED Course/ Medical Decision Making/ A&P                                 Medical Decision Making Amount and/or Complexity of Data Reviewed Labs: ordered. Radiology: ordered.  Risk OTC drugs. Prescription drug management. Decision regarding hospitalization.   57 yo F with a chief complaints of altered mental status.  The patient was found somnolent and off of her CPAP machine.  Per  EMS report the patient is on her machine 24 hours a day 7 days a week.  Patient arrived with a OPA in place and was being actively bagged.  Very low GCS.  I do not feel she would be likely to improve  with BiPAP.  She was intubated.  Her initial i-STAT VBG is not that impressive for hypercarbic respiratory failure.  I am not exactly sure of the cause of her altered mental status.  Will likely need an ABG and perhaps further imaging.  Discussed case with critical care.  Recommended CT of the head.  ICU admission  The patients results and plan were reviewed and discussed.   Any x-rays performed were independently reviewed by myself.   Differential diagnosis were considered with the presenting HPI.   Medications  etomidate (AMIDATE) injection (40 mg Intravenous Given 09/29/23 1312)  rocuronium (ZEMURON) injection (150 mg Intravenous Given 09/29/23 1312)  docusate (COLACE) 50 MG/5ML liquid 100 mg (has no administration in time range)  polyethylene glycol (MIRALAX / GLYCOLAX) packet 17 g (has no administration in time range)  fentaNYL (SUBLIMAZE) injection 50 mcg (has no administration in time range)  fentaNYL in NS (58mcg/ml) infusion-PREMIX (50 mcg/hr Intravenous New Bag/Given 09/29/23 1348)  fentaNYL (SUBLIMAZE) bolus via infusion 50-100 mcg (has no administration in time range)  midazolam (VERSED) injection 1-2 mg (has no administration in time range)  cefTRIAXone (ROCEPHIN) 2 g in sodium chloride 0.9 % 100 mL IVPB (2 g Intravenous New Bag/Given 09/29/23 1413)  azithromycin (ZITHROMAX) 500 mg in sodium chloride 0.9 % 250 mL IVPB (has no administration in time range)  docusate sodium (COLACE) capsule 100 mg (has no administration in time range)  polyethylene glycol (MIRALAX / GLYCOLAX) packet 17 g (has no administration in time range)  insulin aspart (novoLOG) injection 0-20 Units (has no administration in time range)    Vitals:   09/29/23 1251 09/29/23 1322 09/29/23 1324 09/29/23 1328  BP: 109/67     Pulse: (!) 102     Resp: (!) 23     Temp:    (!) 97 F (36.1 C)  TempSrc:    Temporal  SpO2: 97%  94%   Weight:  (!) 150 kg    Height:  5\' 5"  (1.651 m)       Final diagnoses:  Acute on chronic respiratory failure with hypercapnia (HCC)  Somnolence    Admission/ observation were discussed with the admitting physician, patient and/or family and they are comfortable with the plan.          Final Clinical Impression(s) / ED Diagnoses Final diagnoses:  Acute on chronic respiratory failure with hypercapnia (HCC)  Somnolence    Rx / DC Orders ED Discharge Orders     None         Melene Plan, DO 09/29/23 1416

## 2023-09-29 NOTE — Progress Notes (Addendum)
eLink Physician-Brief Progress Note Patient Name: Chelsea [name] DOB: 09-Oct-1965 MRN: [id number]   Date of Service  09/29/2023  HPI/Events of Note  New pt eval  eICU Interventions     Morbid Obesity Recurrent Resp failure Previous trach Hypercarbic metabolic encephalopathy No intracranial abn on CT Possible Obesity Hypovent AKI  Mech vent Wean as tolerated Monitor UOP  Addendum  K:5.2 Repeat K in 6 hrs  Ammonia 51 (AST 50 (NL41) , ALT NL) Start lactulose       Telecia Larocque 09/29/2023, 7:10 PM

## 2023-09-29 NOTE — ED Notes (Signed)
Patient visitor and HCPOA at bedside expressed preference for patient to remain in the [facility] emergency department instead of transferring to the [facility] ICU. Family member informed of and understands the risks associated with that decision and insists that the patient remain here. Patient placement informed.

## 2023-09-29 NOTE — H&P (Signed)
NAME:  Chelsea [name], MRN:  [id number], DOB:  1965-12-02, LOS: 0 ADMISSION DATE:  09/29/2023, CONSULTATION DATE:  09/29/23 REFERRING MD:  Adela Lank, CHIEF COMPLAINT:  AMS   History of Present Illness:  57 year old woman w/ hx of metabolic syndrome, heart failure, HTN, HepC, aflutter on AC, chronic hypoxemic and hypercapneic respiratory failure from OSA/OHS/COPD overlap syndrome, chronic pain on opiates who is presenting from SNF with AMS.  Per SNF, patient has been requiring CPAP constantly since discharge on 09/24/23 (admit for CHF, hypercarbia).  Bagged by EMS en route.  In ER, grimaced to aggressive IV attempts but was otherwise completely comatose so intubated for airway protection.  PCCM to admit.  Head CT pending.  Pertinent  Medical History   Past Medical History:  Diagnosis Date   Acquired hypothyroidism 11/13/2010   Qualifier: Diagnosis of   By: Jens Som, MD, Lyn Hollingshead      Acute congestive heart failure (HCC) 03/18/2021   Acute idiopathic gout of left hand 02/16/2019   Acute kidney injury (HCC) 05/29/2023   Acute on chronic diastolic CHF (congestive heart failure) (HCC) 09/25/2017   Acute on chronic diastolic CHF (congestive heart failure), NYHA class 3 (HCC) 09/25/2017   Acute on chronic hypoxic respiratory failure (HCC) 07/04/2022   Acute on chronic respiratory failure with hypoxia (HCC) 09/17/2022   Acute on chronic respiratory failure with hypoxia and hypercapnia (HCC) 04/07/2022   Acute respiratory failure with hypoxia and hypercarbia (HCC) 05/27/2016   Arthritis    "qwhere" (12/05/2017)   Asthma    Atrial flutter (HCC) 03/09/2021   Bleeding of the respiratory tract 04/07/2022   Bronchitis 05/30/2011   Cervical cancer (HCC) 2006   CHF (congestive heart failure) (HCC)    CHF exacerbation (HCC) 07/30/2022   Chronic diastolic heart failure (HCC) 11/14/2010      11/2010 322 lbs      Chronic heart failure with preserved ejection fraction (HCC) 02/23/2018    Chronic lower back pain    Chronic respiratory failure with hypoxia (HCC) 07/02/2018   ABG 11/2017 7.36/69   Consistent with obesity hypoventilation syndrome   Complication of anesthesia    "I have a hard time waking up from under it" (12/05/2017)   COPD (chronic obstructive pulmonary disease) (HCC) 03/04/2015   Coronary artery disease    Cough productive of clear sputum 06/03/2023   Diarrhea 06/18/2019   Diastolic congestive heart failure (HCC)    Echo 02/21/11: EF 55-60%, moderate LVH, grade 2 diastolic dysfunction, mild to moderate aortic stenosis with a mean gradient 23 mm of mercury   DJD (degenerative joint disease) 07/03/2021   DM2 (diabetes mellitus, type 2) (HCC) 11/10/2010   Dyspnea 12/05/2017   Dyspnea on exertion 02/09/2021   Edema 03/22/2011   Essential (primary) hypertension 11/10/2010   Formatting of this note might be different from the original.  Overview:   Qualifier: Diagnosis of   By: Kem Parkinson      Last Assessment & Plan:   Watch blood pressure off of minoxidil and add medications as needed.   Essential hypertension 11/10/2010   Qualifier: Diagnosis of   By: Kem Parkinson       Gastro-esophageal reflux disease without esophagitis 11/10/2010   GERD 11/10/2010   Qualifier: Diagnosis of   By: Kem Parkinson       Hair loss 09/17/2019   Heart failure (HCC) 04/20/2021   Heart murmur    Hepatitis C    History of cervical cancer 03/22/2011   History of  gout X 1   History of tobacco use 04/20/2021   Hx of atrial flutter 03/15/2021   Hyperglycemia due to type 2 diabetes mellitus (HCC) 04/20/2021   Hyperlipidemia    Hypertension    Hypertension associated with diabetes (HCC) 08/25/2018   Irritable bowel syndrome with constipation 04/20/2021   Irritable bowel syndrome with diarrhea 06/18/2019   Limitation of activity due to disability 09/18/2021   Lymphedema of both lower extremities 03/22/2011   Metabolic alkalosis 03/28/2021   Microalbuminuria 04/20/2021    Migraine    "monthly" (12/05/2017)   Mild tricuspid regurgitation 04/02/2021   Mitral valve sclerosis 04/02/2021   Mixed hyperlipidemia 04/18/2020   Moderate aortic stenosis 04/20/2021   Mononeuropathy of lower extremity 04/09/2011   Formatting of this note might be different from the original.  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  Severe, corrected by CPAP 18 with C-flex of 3  2/13 bipap 20/14 sm ff mask h/h biflex+3cm  Formatting of this note might be different from the original.  Sleep study 06/2020: IMPRESSIONS  - Moderate obstructive sleep apnea occurred during this study (AHI = 17.7/h), mostly REM related  - No significant central sleep apnea occurred during   On home oxygen therapy    "2L all the time" (12/05/2017)   On supplemental oxygen by nasal cannula 04/20/2021   Onychomycosis 06/18/2019   OSA treated with BiPAP    "have CPAP at home too; wearing BiPAP right now" (12/05/2017)   Osteoarthritis 11/28/2010   Qualifier: Diagnosis of   By: Huntley Dec, Scott       Other long term (current) drug therapy 02/12/2022   Paroxysmal atrial fibrillation (HCC) 09/04/2021   Physical debility 05/10/2018   Formatting of this note might be different from the original.  Due to morbid obesity   Physical deconditioning 04/24/2021   Pneumonia     "several times" (12/05/2017)   Pneumonia due to gram-positive bacteria 08/24/2017   Pressure injury of both heels, unstageable (HCC) 07/03/2021   Pulmonary edema, acute (HCC) 02/21/2022   Renal insufficiency 04/20/2021   Restless leg syndrome 03/22/2021   Restrictive lung disease secondary to obesity 01/28/2022   S/P hysterectomy 02/19/2021   Tinea pedis of both feet 11/12/2018   Unspecified atrial flutter (HCC) 02/12/2022   Upper GI bleed 07/18/2022   Venous insufficiency of both lower extremities 07/02/2021   Weakness 03/20/2021   Weight loss 03/15/2021   Significant Hospital Events: Including procedures, antibiotic start and stop dates in addition to other pertinent events   3/29 admit resp failure 9/13 admit resp failure 11/14 admit resp failure 12/19 admit resp failure 12/29 admit resp failure  Interim History / Subjective:  consult  Objective   Blood pressure 109/67, pulse (!) 102, temperature (!) 97 F (36.1 C), temperature source Temporal, resp. rate (!) 23, height 5\' 5"  (  1.651 m), weight (!) 150 kg, SpO2 94%.    Vent Mode: PRVC FiO2 (%):  [100 %] 100 % Set Rate:  [24 bmp] 24 bmp Vt Set:  [450 mL] 450 mL PEEP:  [5 cmH20] 5 cmH20 Plateau Pressure:  [33 cmH20] 33 cmH20  No intake or output data in the 24 hours ending 09/29/23 1416 Filed Weights   09/29/23 1322  Weight: (!) 150 kg    Examination: General: obese woman intubated/sedated/paralyzed HENT: pupils are small but reactive Lungs: distant due to habitus, passive, on vent, driving pressure up a bit Cardiovascular: distant due to body habitus, ext warm Abdomen: soft, no BS heard Extremities: chronic lymphedema Neuro: paralyzed GU: foley to be placed  VBG not bad WBC up slightly Mild AKI with mild hyper-K Trop neg Patchy airspace disease on CXR not too different from last time she was here  [facility] Problem list   N/A  Assessment & Plan:  Encephalopathy- a bit out of proportion to CO2  retention.  Had similar presentations but none this severe reviewing hx.  Query opiate effect.  Getting a head CT to start. Acute on chronic hypercarbic and hypoxemic resp failure- due to unfortunately combination of OSA, OHS, COPD (although doubt COPD diagnosis based on PFTs in 2017, DLCO corrects for alveolar ventilation and all symptoms explained by body habitus), heart failure, restrictive lung disease almost all of which is related to near end-stage obesity. AKI- does not sounds like she was eating well at SNF so suspect relative dehydration DM2 with hyperglycemia Afib on AC PTA Depression, anxiety, chronic pain Leukocytosis- rule out sepsis Hypothyroid- TSH pending  - Check ammonia, Pct, UA, UDS - PAD protocol with PRN fent alone for now; hold all PTA sedating meds - Vent bundle - Foley placement, check BNP; okay for some gentle hydration I think intravascularly dry - Give 30 lantus now along with q4h resistent SSI - Ceftriaxone while awaiting culture data and Pct although just received a course of this last admit for presumed CAP - CT head, if neg, start PTA eliquis - Other home meds adjusted as per orders - Long term need to talk to her about a trach - Updated boyfriend who apparently is POA per sister  Medical sales representative (right click and "Reselect all SmartList Selections" daily)   Diet/type: NPO DVT prophylaxis other Pressure ulcer(s): pressure ulcer assessment deferred  no one to help me turn GI prophylaxis: PPI Lines: N/A Foley:  Yes, and it is still needed Code Status:  full code Last date of multidisciplinary goals of care discussion [updated POA BF by phone]  Labs   CBC: Recent Labs  Lab 09/29/23 1300 09/29/23 1308 09/29/23 1309  WBC 14.8*  --   --   NEUTROABS 12.6*  --   --   HGB 11.5* 13.3 12.9  HCT 39.4 39.0 38.0  MCV 92.5  --   --   PLT 301  --   --     Basic Metabolic Panel: Recent Labs  Lab 09/23/23 0420 09/24/23 0459 09/29/23 1300 09/29/23 1308  09/29/23 1309  NA 139 139 135 136 136  K 3.5 3.8 5.2* 5.1 5.1  CL 82* 83* 94* 95*  --   CO2 44* 43* 29  --   --   GLUCOSE 201* 205* 433* 437*  --   BUN 14 14 24* 31*  --   CREATININE 0.83 0.89 1.18* 1.30*  --   CALCIUM 8.8* 8.9 8.4*  --   --  MG 2.1 2.3  --   --   --    GFR: Estimated Creatinine Clearance: 69.3 mL/min (A) (by C-G formula based on SCr of 1.3 mg/dL (H)). Recent Labs  Lab 09/29/23 1300 09/29/23 1310  WBC 14.8*  --   LATICACIDVEN  --  3.1*    Liver Function Tests: Recent Labs  Lab 09/29/23 1300  AST 50*  ALT 24  ALKPHOS 121  BILITOT 0.4  PROT 7.6  ALBUMIN 3.0*   No results for input(s): "LIPASE", "AMYLASE" in the last 168 hours. No results for input(s): "AMMONIA" in the last 168 hours.  ABG    Component Value Date/Time   PHART 7.349 (L) 08/15/2023 1402   PCO2ART 96.6 (HH) 08/15/2023 1402   PO2ART 54 (L) 08/15/2023 1402   HCO3 35.5 (H) 09/29/2023 1309   TCO2 38 (H) 09/29/2023 1309   ACIDBASEDEF 7.9 (H) 09/20/2023 2156   O2SAT 96 09/29/2023 1309     Coagulation Profile: No results for input(s): "INR", "PROTIME" in the last 168 hours.  Cardiac Enzymes: No results for input(s): "CKTOTAL", "CKMB", "CKMBINDEX", "TROPONINI" in the last 168 hours.  HbA1C: Hgb A1c MFr Bld  Date/Time Value Ref Range Status  08/16/2023 04:19 AM 9.3 (H) 4.8 - 5.6 % Final    Comment:    (NOTE) Pre diabetes:          5.7%-6.4%  Diabetes:              >6.4%  Glycemic control for   <7.0% adults with diabetes   05/29/2023 11:43 AM 8.5 (H) 4.8 - 5.6 % Final    Comment:    (NOTE) Pre diabetes:          5.7%-6.4%  Diabetes:              >6.4%  Glycemic control for   <7.0% adults with diabetes     CBG: Recent Labs  Lab 09/23/23 1713 09/23/23 2006 09/24/23 0739 09/24/23 1100 09/29/23 1300  GLUCAP 251* 279* 243* 273* 375*    Review of Systems:   Intubated/sedated/paralyzed  Past Medical History:  She,  has a past medical history of Acquired  hypothyroidism (11/13/2010), Acute congestive heart failure (HCC) (03/18/2021), Acute idiopathic gout of left hand (02/16/2019), Acute kidney injury (HCC) (05/29/2023), Acute on chronic diastolic CHF (congestive heart failure) (HCC) (09/25/2017), Acute on chronic diastolic CHF (congestive heart failure), NYHA class 3 (HCC) (09/25/2017), Acute on chronic hypoxic respiratory failure (HCC) (07/04/2022), Acute on chronic respiratory failure with hypoxia (HCC) (09/17/2022), Acute on chronic respiratory failure with hypoxia and hypercapnia (HCC) (04/07/2022), Acute respiratory failure with hypoxia and hypercarbia (HCC) (05/27/2016), Arthritis, Asthma, Atrial flutter (HCC) (03/09/2021), Bleeding of the respiratory tract (04/07/2022), Bronchitis (05/30/2011), Cervical cancer (HCC) (2006), CHF (congestive heart failure) (HCC), CHF exacerbation (HCC) (07/30/2022), Chronic diastolic heart failure (HCC) (16/07/9603), Chronic heart failure with preserved ejection fraction (HCC) (02/23/2018), Chronic lower back pain, Chronic respiratory failure with hypoxia (HCC) (07/02/2018), Complication of anesthesia, COPD (chronic obstructive pulmonary disease) (HCC) (03/04/2015), Coronary artery disease, Cough productive of clear sputum (06/03/2023), Diarrhea (06/18/2019), Diastolic congestive heart failure (HCC), DJD (degenerative joint disease) (07/03/2021), DM2 (diabetes mellitus, type 2) (HCC) (11/10/2010), Dyspnea (12/05/2017), Dyspnea on exertion (02/09/2021), Edema (03/22/2011), Essential (primary) hypertension (11/10/2010), Essential hypertension (11/10/2010), Gastro-esophageal reflux disease without esophagitis (11/10/2010), GERD (11/10/2010), Hair loss (09/17/2019), Heart failure (HCC) (04/20/2021), Heart murmur, Hepatitis C, History of cervical cancer (03/22/2011), History of gout (X 1), History of tobacco use (04/20/2021), atrial flutter (03/15/2021), Hyperglycemia due to type 2 diabetes mellitus (  HCC) (04/20/2021),  Hyperlipidemia, Hypertension, Hypertension associated with diabetes (HCC) (08/25/2018), Irritable bowel syndrome with constipation (04/20/2021), Irritable bowel syndrome with diarrhea (06/18/2019), Limitation of activity due to disability (09/18/2021), Lymphedema of both lower extremities (03/22/2011), Metabolic alkalosis (03/28/2021), Microalbuminuria (04/20/2021), Migraine, Mild tricuspid regurgitation (04/02/2021), Mitral valve sclerosis (04/02/2021), Mixed hyperlipidemia (04/18/2020), Moderate aortic stenosis (04/20/2021), Mononeuropathy of lower extremity (04/09/2011), Morbid (severe) obesity due to excess calories (HCC) (03/22/2011), Morbid obesity (HCC) (03/22/2011), Morbid obesity with BMI of 50.0-59.9, adult (HCC) (04/02/2021), Multifocal pneumonia (resolved - awaiting placement) (06/14/2023), Neuropathic pain, leg (04/09/2011), 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), Nocturnal hypoxemia (06/03/2020), Non-smoker (04/02/2021), Obesity hypoventilation syndrome (HCC), Obstructive sleep apnea (11/10/2010), Obstructive sleep apnea (adult) (pediatric) (11/10/2010), On home oxygen therapy, On supplemental oxygen by nasal cannula (04/20/2021), Onychomycosis (06/18/2019), OSA treated with BiPAP, Osteoarthritis (11/28/2010), Other long term (current) drug therapy (02/12/2022), Paroxysmal atrial fibrillation (HCC) (09/04/2021), Physical debility (05/10/2018), Physical deconditioning (04/24/2021), Pneumonia, Pneumonia due to gram-positive bacteria (08/24/2017), Pressure injury of both heels, unstageable (HCC) (07/03/2021), Pulmonary edema, acute (HCC) (02/21/2022), Renal insufficiency (04/20/2021), Restless leg syndrome (03/22/2021), Restrictive lung disease secondary to obesity (01/28/2022), S/P hysterectomy (02/19/2021), Tinea pedis of both feet (11/12/2018), Unspecified atrial flutter (HCC) (02/12/2022), Upper GI bleed (07/18/2022), Venous insufficiency of both lower extremities (07/02/2021),  Weakness (03/20/2021), and Weight loss (03/15/2021).   Surgical History:   Past Surgical History:  Procedure Laterality Date   ABDOMINAL SURGERY  2006 X 2   "TAH incision was infected"   BIOPSY  07/21/2022   Procedure: BIOPSY;  Surgeon: Kathi Der, MD;  Location: [facility];  Service: Gastroenterology;;   CHOLECYSTECTOMY OPEN     ESOPHAGOGASTRODUODENOSCOPY N/A 07/21/2022   Procedure: ESOPHAGOGASTRODUODENOSCOPY (EGD);  Surgeon: Kathi Der, MD;  Location: [facility];  Service: Gastroenterology;  Laterality: N/A;   TONSILLECTOMY     TOTAL ABDOMINAL HYSTERECTOMY  2006   TRACHEOSTOMY REVISION N/A 04/09/2022   Procedure: CONTROL OF BLEEDING,TRACHEOSTOMY SITE;  Surgeon: Christia Reading, MD;  Location: [facility] OR;  Service: ENT;  Laterality: N/A;     Social History:   reports that she has never smoked. She has never used smokeless tobacco. She reports that she does not currently use alcohol. She reports that she does not use drugs.   Family History:  Her family history includes Heart disease in her father.   Allergies Allergies  Allergen Reactions   Iodinated Contrast Media Anaphylaxis   Penicillins Anaphylaxis    Patient tolerates cefepime (08/2017)   Insulin Lispro Other (See Comments)    Adder per MAR from SNF   Minoxidil Hives     Home Medications  Prior to Admission medications   Medication Sig Start Date End Date Taking? Authorizing Provider  albuterol (PROVENTIL) (2.5 MG/3ML) 0.083% nebulizer solution Take 3 mLs (2.5 mg total) by nebulization every 6 (six) hours as needed for wheezing or shortness of breath (I50.32). 07/04/16   Oretha Milch, MD  amiodarone (PACERONE) 200 MG tablet Take 1 tablet (200 mg total) by mouth daily. 06/21/23   Alfredo Martinez, MD  ammonium lactate (LAC-HYDRIN) 12 % lotion Apply 1 Application topically at bedtime.    [provider]  atorvastatin (LIPITOR) 20 MG tablet Take 20 mg by mouth every evening.    [provider]   budesonide (PULMICORT) 0.5 MG/2ML nebulizer solution Inhale 0.5 mg into the lungs every 12 (twelve) hours.    [provider]  buPROPion ER (WELLBUTRIN SR) 100 MG 12 hr tablet Take 1 tablet (100 mg total) by mouth 2 (two)  times daily. 06/08/23   Alfredo Martinez, MD  Cholecalciferol 125 MCG (5000 UT) TABS Take 1 tablet (5,000 Units total) by mouth daily. 07/10/22   [name], Werner Lean, MD  Dulaglutide (TRULICITY) 0.75 MG/0.5ML SOAJ Inject 0.75 mg into the skin every Saturday.    [provider]  ELIQUIS 5 MG TABS tablet Take 5 mg by mouth 2 (two) times daily. 04/12/21   [provider]  empagliflozin (JARDIANCE) 25 MG TABS tablet Take 1 tablet (25 mg total) by mouth daily. 08/20/23   Celine Mans, MD  ferrous sulfate 325 (65 FE) MG EC tablet Take 325 mg by mouth daily.    [provider]  gabapentin (NEURONTIN) 400 MG capsule Take 400 mg by mouth 3 (three) times daily.    [provider]  Insulin Glargine (BASAGLAR KWIKPEN) 100 UNIT/ML Inject 30 Units into the skin daily. 08/21/23   Celine Mans, MD  levothyroxine (SYNTHROID) 150 MCG tablet Take 1 tablet (150 mcg total) by mouth daily at 6 (six) AM. Patient taking differently: Take 150 mcg by mouth daily. 07/10/22   [name], Werner Lean, MD  loperamide (IMODIUM) 2 MG capsule Take 2 mg by mouth every 4 (four) hours as needed for diarrhea or loose stools.    [provider]  melatonin 3 MG TABS tablet Take 1 tablet (3 mg total) by mouth at bedtime. 08/19/23   Celine Mans, MD  montelukast (SINGULAIR) 10 MG tablet Take 10 mg by mouth at bedtime.    [provider]  Multiple Vitamin (MULTIVITAMIN) tablet Take 1 tablet by mouth daily. Patient not taking: Reported on 08/15/2023    [provider]  NON FORMULARY Apply BIPAP every night at bedtime.    [provider]  nystatin cream (MYCOSTATIN) Apply topically 2 (two) times daily. Patient not taking: Reported on  08/15/2023 06/24/23   Alfredo Martinez, MD  omeprazole (PRILOSEC) 20 MG capsule Take 20 mg by mouth daily. 10/02/22   [provider]  oxyCODONE-acetaminophen (PERCOCET) 10-325 MG tablet Take 1 tablet by mouth every 6 (six) hours. 06/21/23   Alfredo Martinez, MD  OXYGEN Inhale 5 L/min into the lungs in the morning and at bedtime. Connect to BIPAP qhs    [provider]  potassium chloride SA (KLOR-CON M) 20 MEQ tablet Take 2 tablets (40 mEq total) by mouth daily. 06/21/23   Alfredo Martinez, MD  rOPINIRole (REQUIP) 0.5 MG tablet Take 1 tablet (0.5 mg total) by mouth at bedtime. 06/21/23   Alfredo Martinez, MD  spironolactone (ALDACTONE) 25 MG tablet Take 1 tablet (25 mg total) by mouth daily. 10/17/22   Robbie Lis M, PA-C  torsemide (DEMADEX) 20 MG tablet Take 60 mg by mouth 2 (two) times daily.    [provider]     Critical care time: 41 mins

## 2023-09-29 NOTE — Progress Notes (Signed)
Received patient from ED at 1845. Bath and CHG done. Patient on levophed and precedex. Further VS information in flowsheets. Patient hypotensive with titration of levo per protocol. Report given to Clemon Chambers, RN.

## 2023-09-29 NOTE — Progress Notes (Signed)
RT transported pt on ventilator from trauma C to CT and back without any complications. RN at bedside.

## 2023-09-29 NOTE — Code Documentation (Signed)
Fluids ready, Dr Adela Lank intubating with glide

## 2023-09-29 NOTE — Progress Notes (Signed)
RT pulled ETT out 2cm from 22 @ lip to 20 @ lip per MD.

## 2023-09-29 NOTE — Progress Notes (Signed)
RT x 2 attempted to obtain a post intubation ABG with doppler without any success. RN notified to possibly obtain a VBG.

## 2023-09-29 NOTE — ED Notes (Signed)
CCMD notified that patient's BP continues to trend down, orders placed for levo

## 2023-09-30 ENCOUNTER — Inpatient Hospital Stay (HOSPITAL_COMMUNITY): Payer: Medicare Other

## 2023-09-30 DIAGNOSIS — R569 Unspecified convulsions: Secondary | ICD-10-CM

## 2023-09-30 DIAGNOSIS — R4182 Altered mental status, unspecified: Secondary | ICD-10-CM | POA: Diagnosis not present

## 2023-09-30 DIAGNOSIS — J9601 Acute respiratory failure with hypoxia: Secondary | ICD-10-CM

## 2023-09-30 LAB — RESPIRATORY PANEL BY PCR

## 2023-09-30 LAB — GLUCOSE, CAPILLARY
Glucose-Capillary: 196 mg/dL — ABNORMAL HIGH (ref 70–99)
Glucose-Capillary: 185 mg/dL — ABNORMAL HIGH (ref 70–99)
Glucose-Capillary: 171 mg/dL — ABNORMAL HIGH (ref 70–99)
Glucose-Capillary: 174 mg/dL — ABNORMAL HIGH (ref 70–99)
Glucose-Capillary: 143 mg/dL — ABNORMAL HIGH (ref 70–99)
Glucose-Capillary: 173 mg/dL — ABNORMAL HIGH (ref 70–99)

## 2023-09-30 LAB — BASIC METABOLIC PANEL
Anion gap: 13 (ref 5–15)
Anion gap: 11 (ref 5–15)
BUN: 24 mg/dL — ABNORMAL HIGH (ref 6–20)
BUN: 23 mg/dL — ABNORMAL HIGH (ref 6–20)
CO2: 29 mmol/L (ref 22–32)
CO2: 33 mmol/L — ABNORMAL HIGH (ref 22–32)
Calcium: 8.6 mg/dL — ABNORMAL LOW (ref 8.9–10.3)
Calcium: 8.7 mg/dL — ABNORMAL LOW (ref 8.9–10.3)
Chloride: 99 mmol/L (ref 98–111)
Chloride: 97 mmol/L — ABNORMAL LOW (ref 98–111)
Creatinine, Ser: 0.99 mg/dL (ref 0.44–1.00)
Creatinine, Ser: 0.89 mg/dL (ref 0.44–1.00)
GFR, Estimated: >60 mL/min (ref >60)
GFR, Estimated: >60 mL/min (ref >60)
Glucose, Bld: 212 mg/dL — ABNORMAL HIGH (ref 70–99)
Glucose, Bld: 180 mg/dL — ABNORMAL HIGH (ref 70–99)
Potassium: 4.9 mmol/L (ref 3.5–5.1)
Potassium: 4.7 mmol/L (ref 3.5–5.1)
Sodium: 141 mmol/L (ref 135–145)
Sodium: 141 mmol/L (ref 135–145)

## 2023-09-30 LAB — PHOSPHORUS: Phosphorus: 2.4 mg/dL — ABNORMAL LOW (ref 2.5–4.6)

## 2023-09-30 LAB — STREP PNEUMONIAE URINARY ANTIGEN: Strep Pneumo Urinary Antigen: NEGATIVE

## 2023-09-30 LAB — CBC
HCT: 38.6 % (ref 36.0–46.0)
Hemoglobin: 11.5 g/dL — ABNORMAL LOW (ref 12.0–15.0)
MCH: 26.7 pg (ref 26.0–34.0)
MCHC: 29.8 g/dL — ABNORMAL LOW (ref 30.0–36.0)
MCV: 89.6 fL (ref 80.0–100.0)
Platelets: 327 10*3/uL (ref 150–400)
RBC: 4.31 MIL/uL (ref 3.87–5.11)
RDW: 15.3 % (ref 11.5–15.5)
WBC: 11.8 10*3/uL — ABNORMAL HIGH (ref 4.0–10.5)
nRBC: 0 % (ref 0.0–0.2)

## 2023-09-30 LAB — MAGNESIUM: Magnesium: 2.5 mg/dL — ABNORMAL HIGH (ref 1.7–2.4)

## 2023-09-30 MED ORDER — BUPROPION HCL 100 MG PO TABS
100.0000 mg | ORAL_TABLET | Freq: Two times a day (BID) | ORAL | Status: DC
  Filled 2023-09-30: qty 1

## 2023-09-30 MED ORDER — VITAL 1.5 CAL PO LIQD
1000.0000 mL | ORAL | Status: DC
  Administered 2023-09-30: 1000 mL

## 2023-09-30 MED ORDER — PROSOURCE TF20 ENFIT COMPATIBL EN LIQD
60.0000 mL | Freq: Every day | ENTERAL | Status: DC
Start: 2023-09-30 — End: 2023-10-01
  Administered 2023-09-30 – 2023-10-01 (×2): 60 mL
  Filled 2023-09-30 (×2): qty 60

## 2023-09-30 MED ORDER — MUPIROCIN 2 % EX OINT
1.0000 | TOPICAL_OINTMENT | Freq: Two times a day (BID) | CUTANEOUS | Status: AC
  Administered 2023-09-30 – 2023-10-04 (×10): 1 via NASAL
  Filled 2023-09-30: qty 22

## 2023-09-30 MED ORDER — ACETAMINOPHEN 160 MG/5ML PO SOLN
650.0000 mg | Freq: Four times a day (QID) | ORAL | Status: DC | PRN
  Administered 2023-09-30 (×2): 650 mg
  Filled 2023-09-30 (×2): qty 20.3

## 2023-09-30 MED ORDER — SODIUM CHLORIDE 0.9% FLUSH
10.0000 mL | Freq: Two times a day (BID) | INTRAVENOUS | Status: DC
  Administered 2023-09-30 (×2): 10 mL
  Administered 2023-10-01: 30 mL
  Administered 2023-10-01 – 2023-10-07 (×12): 10 mL

## 2023-09-30 MED ORDER — SODIUM CHLORIDE 0.9% FLUSH: 10.0000 mL | INTRAVENOUS | Status: DC | PRN

## 2023-09-30 MED ORDER — ACETAMINOPHEN 325 MG PO TABS: 650.0000 mg | ORAL_TABLET | Freq: Four times a day (QID) | ORAL | Status: DC | PRN

## 2023-09-30 MED ORDER — CHLORHEXIDINE GLUCONATE CLOTH 2 % EX PADS
6.0000 | MEDICATED_PAD | Freq: Every day | CUTANEOUS | Status: AC
  Administered 2023-09-30 – 2023-10-04 (×5): 6 via TOPICAL

## 2023-09-30 MED ORDER — ENOXAPARIN SODIUM 150 MG/ML IJ SOSY
150.0000 mg | PREFILLED_SYRINGE | Freq: Two times a day (BID) | INTRAMUSCULAR | Status: DC
  Administered 2023-09-30 – 2023-10-02 (×5): 150 mg via SUBCUTANEOUS
  Filled 2023-09-30 (×6): qty 1

## 2023-09-30 NOTE — Procedures (Signed)
Patient Name: Chelsea [name]  MRN: [id number]  Epilepsy Attending: Charlsie Quest  Referring Physician/Provider: Kalman Shan, MD  Date: 09/30/2023  Duration: 22.28 mins  Patient history: 57yo M with ams getting eeg to evaluate for seizure  Level of alertness: Awake  AEDs during EEG study: GBP  Technical aspects: This EEG study was done with scalp electrodes positioned according to the 10-20 International system of electrode placement. Electrical activity was reviewed with band pass filter of 1-70Hz , sensitivity of 7 uV/mm, display speed of 63mm/sec with a 60Hz  notched filter applied as appropriate. EEG data were recorded continuously and digitally stored.  Video monitoring was available and reviewed as appropriate.  Description: EEG showed continuous generalized 5 to 7 Hz theta slowing admixed with intermittent 2-3hz  delta slowing in left hemisphere. Hyperventilation and photic stimulation were not performed.     ABNORMALITY - Continuous slow, generalized  IMPRESSION: This study is suggestive of moderate diffuse encephalopathy. No seizures or epileptiform discharges were seen throughout the recording.  Chelsea [name]

## 2023-09-30 NOTE — Progress Notes (Signed)
EEG complete - results pending. LG assisted with hookup.

## 2023-09-30 NOTE — H&P (Signed)
NAME:  Chelsea [name], MRN:  [id number], DOB:  09/10/1966, LOS: 1 ADMISSION DATE:  09/29/2023, CONSULTATION DATE:  09/29/23 REFERRING MD:  Adela Lank, CHIEF COMPLAINT:  AMS   History of Present Illness:  57 year old woman w/ hx of metabolic syndrome, heart failure, HTN, HepC, aflutter on AC, chronic hypoxemic and hypercapneic respiratory failure from OSA/OHS/COPD overlap syndrome, chronic pain on opiates who is presenting from SNF with AMS.  Per SNF, patient has been requiring CPAP constantly since discharge on 09/24/23 (admit for CHF, hypercarbia).  Bagged by EMS en route.  In ER, grimaced to aggressive IV attempts but was otherwise completely comatose so intubated for airway protection.  PCCM to admit.  Head CT pending.  Pertinent  Medical History     has a past medical history of Acquired hypothyroidism (11/13/2010), Acute congestive heart failure (HCC) (03/18/2021), Acute idiopathic gout of left hand (02/16/2019), Acute kidney injury (HCC) (05/29/2023), Acute on chronic diastolic CHF (congestive heart failure) (HCC) (09/25/2017), Acute on chronic diastolic CHF (congestive heart failure), NYHA class 3 (HCC) (09/25/2017), Acute on chronic hypoxic respiratory failure (HCC) (07/04/2022), Acute on chronic respiratory failure with hypoxia (HCC) (09/17/2022), Acute on chronic respiratory failure with hypoxia and hypercapnia (HCC) (04/07/2022), Acute respiratory failure with hypoxia and hypercarbia (HCC) (05/27/2016), Arthritis, Asthma, Atrial flutter (HCC) (03/09/2021), Bleeding of the respiratory tract (04/07/2022), Bronchitis (05/30/2011), Cervical cancer (HCC) (2006), CHF (congestive heart failure) (HCC), CHF exacerbation (HCC) (07/30/2022), Chronic diastolic heart failure (HCC) (25/95/6387), Chronic heart failure with preserved ejection fraction (HCC) (02/23/2018), Chronic lower back pain, Chronic respiratory failure with hypoxia (HCC) (07/02/2018), Complication of anesthesia, COPD (chronic obstructive  pulmonary disease) (HCC) (03/04/2015), Coronary artery disease, Cough productive of clear sputum (06/03/2023), Diarrhea (06/18/2019), Diastolic congestive heart failure (HCC), DJD (degenerative joint disease) (07/03/2021), DM2 (diabetes mellitus, type 2) (HCC) (11/10/2010), Dyspnea (12/05/2017), Dyspnea on exertion (02/09/2021), Edema (03/22/2011), Essential (primary) hypertension (11/10/2010), Essential hypertension (11/10/2010), Gastro-esophageal reflux disease without esophagitis (11/10/2010), GERD (11/10/2010), Hair loss (09/17/2019), Heart failure (HCC) (04/20/2021), Heart murmur, Hepatitis C, History of cervical cancer (03/22/2011), History of gout (X 1), History of tobacco use (04/20/2021), atrial flutter (03/15/2021), Hyperglycemia due to type 2 diabetes mellitus (HCC) (04/20/2021), Hyperlipidemia, Hypertension, Hypertension associated with diabetes (HCC) (08/25/2018), Irritable bowel syndrome with constipation (04/20/2021), Irritable bowel syndrome with diarrhea (06/18/2019), Limitation of activity due to disability (09/18/2021), Lymphedema of both lower extremities (03/22/2011), Metabolic alkalosis (03/28/2021), Microalbuminuria (04/20/2021), Migraine, Mild tricuspid regurgitation (04/02/2021), Mitral valve sclerosis (04/02/2021), Mixed hyperlipidemia (04/18/2020), Moderate aortic stenosis (04/20/2021), Mononeuropathy of lower extremity (04/09/2011), Morbid (severe) obesity due to excess calories (HCC) (03/22/2011), Morbid obesity (HCC) (03/22/2011), Morbid obesity with BMI of 50.0-59.9, adult (HCC) (04/02/2021), Multifocal pneumonia (resolved - awaiting placement) (06/14/2023), Neuropathic pain, leg (04/09/2011), 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), Nocturnal hypoxemia (06/03/2020), Non-smoker (04/02/2021), Obesity hypoventilation syndrome (HCC), Obstructive sleep apnea (11/10/2010), Obstructive sleep apnea (adult) (pediatric) (11/10/2010), On home oxygen therapy, On supplemental  oxygen by nasal cannula (04/20/2021), Onychomycosis (06/18/2019), OSA treated with BiPAP, Osteoarthritis (11/28/2010), Other long term (current) drug therapy (02/12/2022), Paroxysmal atrial fibrillation (HCC) (09/04/2021), Physical debility (05/10/2018), Physical deconditioning (04/24/2021), Pneumonia, Pneumonia due to gram-positive bacteria (08/24/2017), Pressure injury of both heels, unstageable (HCC) (07/03/2021), Pulmonary edema, acute (HCC) (02/21/2022), Renal insufficiency (04/20/2021), Restless leg syndrome (03/22/2021), Restrictive lung disease secondary to obesity (01/28/2022), S/P hysterectomy (02/19/2021), Tinea pedis of both feet (11/12/2018), Unspecified atrial flutter (HCC) (02/12/2022), Upper GI bleed (07/18/2022), Venous insufficiency of both lower extremities (07/02/2021), Weakness (03/20/2021), and Weight loss (03/15/2021).   has a past  surgical history that includes Tonsillectomy; Cholecystectomy open; Total abdominal hysterectomy (2006); Abdominal surgery (2006 X 2); Tracheostomy revision (N/A, 04/09/2022); Esophagogastroduodenoscopy (N/A, 07/21/2022); and biopsy (07/21/2022).   Significant Hospital Events: Including procedures, antibiotic start and stop dates in addition to other pertinent events   3/29 admit resp failure 9/13 admit resp failure 11/14 admit resp failure 12/19 admit resp failure 12/29 admit resp failure  Interim History / Subjective:    09/30/23: Currently on Precedex infusion, Levophed infusion.  Also on the ventilator FiO2 35%.  Febrile 202 Fahrenheit with a white count of 14.8 K.  MRSA PCR is positive blood cultures pending.  Urine tox is negative.  UA is normal.  CT head is normal other than the fact the left sphenoid sinus is opacified.  Objective   Blood pressure 96/64, pulse (!) 55, temperature (!) 102 F (38.9 C), resp. rate (!) 26, height 5\' 5"  (1.651 m), weight (!) 157.8 kg, SpO2 91%.    Vent Mode: PRVC FiO2 (%):  [35 %-100 %] 35 % Set Rate:  [24  bmp-26 bmp] 26 bmp Vt Set:  [450 mL] 450 mL PEEP:  [5 cmH20-8 cmH20] 8 cmH20 Plateau Pressure:  [24 cmH20-33 cmH20] 24 cmH20   Intake/Output Summary (Last 24 hours) at 09/30/2023 0754 Last data filed at 09/30/2023 0700 Gross per 24 hour  Intake 2213.76 ml  Output 1025 ml  Net 1188.76 ml   Filed Weights   09/29/23 1322 09/30/23 0500  Weight: (!) 150 kg (!) 157.8 kg    Examination: General Appearance:  Looks criticall ill OBESE - + Head:  Normocephalic, without obvious abnormality, atraumatic Eyes:  PERRL - yes, conjunctiva/corneas - muddy     Ears:  Normal external ear canals, both ears Nose:  G tube - no Throat:  ETT TUBE - yes , OG tube - yes Neck:  Supple,  No enlargement/tenderness/nodules Lungs: Clear to auscultation bilaterally, Ventilator   Synchrony - yes 60%,  Heart:  S1 and S2 normal, no murmur, CVP - no.  Pressors - LEVOPHHED Abdomen:  Soft, no masses, no organomegaly Genitalia / Rectal:  Not done Extremities:  Extremities- intact Skin:  ntact in exposed areas . Sacral area - not examind Neurologic:  Sedation - PREcedex gtt -> RASS - -4 .     Resolved Hospital Problem list   N/A  Assessment & Plan:  Depression, anxiety, chronic pain - on gagabpentin, wellbutrin at home Sedation needs on the ventilator Encephalopathy- a bit out of proportion to CO2 retention.  Had similar presentations but none this severe reviewing hx.  Query opiate effect.  CT head there is unremarkable.  Urine drug screen is negative  -RASS -4 on Precedex infusion  Plan - Get EEG - Check right upper quadrant ultrasound to rule out cirrhosis -continue  empiric lactulose  - hold starting wellbutrin - dc gbapenting  - do WUA   Acute on chronic hypercarbic and hypoxemic resp failure- due to unfortunately combination of OSA, OHS, COPD (although doubt COPD diagnosis based on PFTs in 2017, DLCO corrects for alveolar ventilation and all symptoms explained by body habitus), heart failure,  restrictive lung disease almost all of which is related to near end-stage obesity.  09/30/2023 - > does NOT meet criteria for SBT/Extubation in setting of Acute Respiratory Failure due to encephaloatph  PLAN  - PRVC - VAP bundle -Not interested in tracheostomy in the long-term  Circulatory shock  ? Sepsis,+/- precedex  Plan  - levophed MAP > 65   Sepsis of unclear  source: Possible respiratory  Plan - Check respiratory virus panel -Check tracheal aspirate for culture - Monitor procalcitonin Check urine strep Check urine Legionella -Ceftriaxone 09/29/2023> in Dr. Michaelle Copas at this -Azithromycin 09/29/2023 - 09/29/2023      AKI- does not sounds like she was eating well at SNF so suspect relative dehydration  - resolved  Plan  - monitor   DM2 with hyperglycemia will do that was part of the wake up assessment Recall of  Plan  - ssi  Afib on AC PTA  - HR 53. Trop normal  Plan  - contineu amio -Continue Lipitor - Inpatient Lovenox for another day or 2 and then go to p.o. Eliquis   Hypothyroid- TSH pending   Best Practice (right click and "Reselect all SmartList Selections" daily)   Diet/type: NPO nutrition consult for tube feeds DVT prophylaxis other Lovenox SQ  Pressure ulcer(s): pressure ulcer assessment deferred  -morbidly obese and required nursing support to visualize  GI prophylaxis: PPI Lines: N/A Foley:  Yes, and it is still needed Code Status:  full code Last date of multidisciplinary goals of care discussion [updated POA BF by phone]  Called Marshall Cork [phone]:  updated over phone  ATTESTATION & SIGNATURE   The patient Chelsea [name] is critically ill with multiple organ systems failure and requires high complexity decision making for assessment and support, frequent evaluation and titration of therapies, application of advanced monitoring technologies and extensive interpretation of multiple databases and discussion with other  appropriate health care personnel such as bedside nurses, social workers, case Production designer, theatre/television/film, consultants, respiratory therapists, nutritionists, secretaries etc.,  Critical care time includes but is not restricted to just documentation time. Documentation can happen in parallel or sequential to care time depending on case mix urgency and priorities for the shift. So, overall critical Care Time devoted to patient care services described in this note is  40  Minutes.   This time reflects time of care of this signee Dr Kalman Shan which includ does not reflect procedure time, or teaching time or supervisory time of PA/NP/Med student/Med Resident etc but could involve care discussion time     Dr. Kalman Shan, M.D., [facility].C.P Pulmonary and Critical Care Medicine Staff Physician, [facility] Pager: [phone], If no answer or between  15:00h - 7:00h: call [phone]  09/30/2023 7:54 AM    LABS    PULMONARY ABG    Component Value Date/Time   PHART 7.349 (L) 08/15/2023 1402   PCO2ART 96.6 (HH) 08/15/2023 1402   PO2ART 54 (L) 08/15/2023 1402   HCO3 37.4 (H) 09/29/2023 2149   TCO2 41 (H) 09/29/2023 1550   ACIDBASEDEF 7.9 (H) 09/20/2023 2156   O2SAT 77.7 09/29/2023 2149     CBC Recent Labs  Lab 09/29/23 1300 09/29/23 1308 09/29/23 1309 09/29/23 1550  HGB 11.5* 13.3 12.9 11.9*  HCT 39.4 39.0 38.0 35.0*  WBC 14.8*  --   --   --   PLT 301  --   --   --     COAGULATION No results for input(s): "INR" in the last 168 hours.  CARDIAC  No results for input(s): "TROPONINI" in the last 168 hours. No results for input(s): "PROBNP" in the last 168 hours.   CHEMISTRY Recent Labs  Lab 09/24/23 0459 09/29/23 1300 09/29/23 1308 09/29/23 1309 09/29/23 1550 09/30/23 0109  NA 139 135 136 136 138 141  K 3.8 5.2* 5.1 5.1 4.5 4.9  CL 83* 94* 95*  --   --  99  CO2 43* 29  --   --   --  29  GLUCOSE 205* 433* 437*  --   --  212*   BUN 14 24* 31*  --   --  24*  CREATININE 0.89 1.18* 1.30*  --   --  0.99  CALCIUM 8.9 8.4*  --   --   --  8.6*  MG 2.3  --   --   --   --   --    Estimated Creatinine Clearance: 94 mL/min (by C-G formula based on SCr of 0.99 mg/dL).   LIVER Recent Labs  Lab 09/29/23 1300  AST 50*  ALT 24  ALKPHOS 121  BILITOT 0.4  PROT 7.6  ALBUMIN 3.0*     INFECTIOUS Recent Labs  Lab 09/29/23 1310 09/29/23 1458 09/29/23 1550  LATICACIDVEN 3.1*  --  2.5*  PROCALCITON  --  0.17  --      ENDOCRINE CBG (last 3)  Recent Labs    09/29/23 2341 09/30/23 0341 09/30/23 0722  GLUCAP 217* 196* 185*         IMAGING x48h  - image(s) personally visualized  -   highlighted in bold [place] EKG SITE RITE Result Date: 09/29/2023 If Site Rite image not attached, placement could not be confirmed due to current cardiac rhythm.  CT Head Wo Contrast Result Date: 09/29/2023 CLINICAL DATA:  Altered mental status. EXAM: CT HEAD WITHOUT CONTRAST TECHNIQUE: Contiguous axial images were obtained from the base of the skull through the vertex without intravenous contrast. RADIATION DOSE REDUCTION: This exam was performed according to the departmental dose-optimization program which includes automated exposure control, adjustment of the mA and/or kV according to patient size and/or use of iterative reconstruction technique. COMPARISON:  Head CT 06/14/2023. FINDINGS: Brain: No acute intracranial hemorrhage. Gray-white differentiation is preserved. No hydrocephalus or extra-axial collection. No mass effect or midline shift. Vascular: No hyperdense vessel or unexpected calcification. Skull: No calvarial fracture or suspicious bone lesion. Skull base is unremarkable. Sinuses/Orbits: Moderate opacification of the left sphenoid sinus with increased central attenuation, possibly reflecting inspissated secretions or blood products. Orbits are unremarkable. Other: Bilateral mastoid and left middle ear effusions, likely  secondary to endotracheal intubation. IMPRESSION: 1. No acute intracranial abnormality. 2. Moderate opacification of the left sphenoid sinus with increased central attenuation, possibly reflecting inspissated secretions or blood products. Electronically Signed   By: Orvan Falconer M.D.   On: 09/29/2023 16:58   DG Abd Portable 1V Result Date: 09/29/2023 CLINICAL DATA:  [id number] Encounter for imaging study to confirm orogastric (OG) tube placement [id number] EXAM: PORTABLE ABDOMEN - 1 VIEW COMPARISON:  None Available. FINDINGS: The bowel gas pattern is non-obstructive. No evidence of pneumoperitoneum, within the limitations of a supine film. No acute osseous abnormalities. The soft tissues are within normal limits. Surgical changes, devices, tubes and lines: Enteric tube is seen with its side hole overlying the left upper quadrant, within the distal stomach. The tip is out of the field of view. IMPRESSION: *Nonobstructive bowel gas pattern. Enteric tube is partially seen but appears in satisfactory position. Electronically Signed   By: Jules Schick M.D.   On: 09/29/2023 13:57   DG Chest Portable 1 View Result Date: 09/29/2023 CLINICAL DATA:  Shortness of breath. EXAM: PORTABLE CHEST 1 VIEW COMPARISON:  09/19/2023. FINDINGS: Low lung volume. There are nonspecific opacities throughout bilateral lungs, which appears similar to the prior study and corresponds to patchy areas  of atelectasis/scarring on the prior CT scan from 09/19/2023. No acute consolidation or lung collapse. Bilateral costophrenic angles are clear. Stable cardio-mediastinal silhouette. No acute osseous abnormalities. The soft tissues are within normal limits. Endotracheal tube is seen with its tip approximately 3 cm above the carina. Enteric tube is seen coursing below the left hemidiaphragm however, the tip is out of the field of view. IMPRESSION: Low lung volume.  Essentially stable exam. Electronically Signed   By: Jules Schick M.D.   On:  09/29/2023 13:55

## 2023-09-30 NOTE — Progress Notes (Signed)
Initial Nutrition Assessment  DOCUMENTATION CODES:  Morbid obesity  INTERVENTION:  Initiate tube feeding via OGT: Start Vital 1.5 at 20 ml/h and advance by 10ml q8h to goal rate of 66ml/hr (1200 ml per day) Prosource TF20 60 ml once daily  Provides 1880 kcal, 101 gm protein, 917 ml free water daily  NUTRITION DIAGNOSIS:  Inadequate oral intake related to acute illness as evidenced by NPO status.  GOAL:  Patient will meet greater than or equal to 90% of their needs  MONITOR:  Vent status, Labs, Weight trends, Skin, TF tolerance  REASON FOR ASSESSMENT:  Consult, Ventilator Enteral/tube feeding initiation and management  ASSESSMENT:  Pt admitted with AMS d/t Acute on chronic hypercarbic and hypoxemic resp failure. PMH significant for metabolic syndrome, HF, HTN, hepatitis C, aflutter, chronic hypoxemic and hypercapnic respiratory failure, chronic pain. Recently admitted with CHF, hypercarbia discharged on 12/24.    Pt with 5 admissions since March d/t respiratory failure. Resides at [facility].  Patient is currently intubated on ventilator support. Unable to obtain detailed nutrition related history at this time.   MV: 11.7 L/min Temp (24hrs), Avg:101 F (38.3 C), Min:97 F (36.1 C), Max:102 F (38.9 C)  Admit weight: 150 kg  Current weight: 157.8 kg + edema: mild pitting generalized, moderate pitting BLE   Intake/Output Summary (Last 24 hours) at 09/30/2023 1345 Last data filed at 09/30/2023 1246 Gross per 24 hour  Intake 2284.49 ml  Output 1650 ml  Net 634.49 ml   Net IO Since Admission: 634.49 mL [09/30/23 1345]  Drains/Lines: PICC line OGT (side port within distal stomach)  Nutritionally Relevant Medications: Scheduled Meds:  amiodarone  200 mg Per Tube Daily   atorvastatin  20 mg Per Tube QPM   budesonide  0.5 mg Inhalation Q12H   Chlorhexidine Gluconate Cloth  6 each Topical Q0600   docusate  100 mg Per Tube BID   enoxaparin (LOVENOX) injection  150 mg  Subcutaneous BID   feeding supplement (PROSource TF20)  60 mL Per Tube Daily   insulin aspart  0-20 Units Subcutaneous Q4H   insulin glargine-yfgn  30 Units Subcutaneous QHS   lactulose  10 g Per Tube BID   levothyroxine  150 mcg Per Tube Q0600   montelukast  10 mg Per Tube QHS   mupirocin ointment  1 Application Nasal BID   mouth rinse  15 mL Mouth Rinse Q2H   pantoprazole (PROTONIX) IV  40 mg Intravenous QHS   polyethylene glycol  17 g Per Tube Daily   rOPINIRole  0.5 mg Per Tube QHS   sodium chloride flush  10-40 mL Intracatheter Q12H   Continuous Infusions:  cefTRIAXone (ROCEPHIN)  IV 2 g (09/30/23 0930)   dexmedetomidine (PRECEDEX) IV infusion 0.4 mcg/kg/hr (09/30/23 0914)   feeding supplement (VITAL 1.5 CAL)     norepinephrine (LEVOPHED) Adult infusion 4 mcg/min (09/30/23 0913)   Labs Reviewed: BUN 23 Ionized Ca 1.02 (12/29) Phos 2.4 (L) Mg 2.5 (H) CBG ranges from 185-375 mg/dL over the last 24 hours HgbA1c 9.3% (11/15)  NUTRITION - FOCUSED PHYSICAL EXAM: Flowsheet Row Most Recent Value  Orbital Region No depletion  Upper Arm Region No depletion  Thoracic and Lumbar Region No depletion  Buccal Region Unable to assess  Temple Region No depletion  Clavicle Bone Region Unable to assess  Clavicle and Acromion Bone Region Unable to assess  Scapular Bone Region Unable to assess  Dorsal Hand Unable to assess  [mild non-pitting edema]  Patellar Region Unable to assess  [  moderate non-pitting edema]  Anterior Thigh Region Unable to assess  Posterior Calf Region Unable to assess  [bilateral erythema, flakey]  Edema (RD Assessment) Moderate  [BLE]  Hair Reviewed  Eyes Unable to assess  Mouth Unable to assess  Skin Reviewed  Nails Reviewed       Diet Order:   Diet Order             Diet NPO time specified  Diet effective now                   EDUCATION NEEDS:  No education needs have been identified at this time  Skin:  Skin Assessment: Reviewed RN  Assessment  Last BM:  12/30 (type 6 large)  Height:  Ht Readings from Last 1 Encounters:  09/29/23 5\' 5"  (1.651 m)    Weight:  Wt Readings from Last 1 Encounters:  09/30/23 (!) 157.8 kg    Ideal Body Weight:  56.8 kg  BMI:  Body mass index is 57.89 kg/m.  Estimated Nutritional Needs:  Kcal:  1700-1900 Protein:  100-115g Fluid:  >/=1.9L  Drusilla Kanner, RDN, LDN Clinical Nutrition

## 2023-09-30 NOTE — Progress Notes (Signed)
Peripherally Inserted Central Catheter Placement  The IV Nurse has discussed with the patient and/or persons authorized to consent for the patient, the purpose of this procedure and the potential benefits and risks involved with this procedure.  The benefits include less needle sticks, lab draws from the catheter, and the patient may be discharged home with the catheter. Risks include, but not limited to, infection, bleeding, blood clot (thrombus formation), and puncture of an artery; nerve damage and irregular heartbeat and possibility to perform a PICC exchange if needed/ordered by physician.  Alternatives to this procedure were also discussed.  Bard Power PICC patient education guide, fact sheet on infection prevention and patient information card has been provided to patient /or left at bedside.    PICC Placement Documentation  PICC Triple Lumen 09/30/23 Right Basilic 42 cm 0 cm (Active)  Indication for Insertion or Continuance of Line Vasoactive infusions 09/30/23 1000  Exposed Catheter (cm) 0 cm 09/30/23 1000  Site Assessment Clean, Dry, Intact 09/30/23 1000  Lumen #1 Status Flushed;Saline locked;Blood return noted 09/30/23 1000  Lumen #2 Status Flushed;Saline locked;Blood return noted 09/30/23 1000  Lumen #3 Status Flushed;Saline locked;Blood return noted 09/30/23 1000  Dressing Type Transparent;Securing device 09/30/23 1000  Dressing Status Antimicrobial disc in place;Clean, Dry, Intact 09/30/23 1000  Line Care Connections checked and tightened 09/30/23 1000  Line Adjustment (NICU/IV Team Only) No 09/30/23 1000  Dressing Intervention New dressing;Adhesive placed at insertion site (IV team only);Other (Comment) 09/30/23 1000  Dressing Change Due 10/07/23 09/30/23 1000    Patient's POA, Fredrik Rigger, signed PICC consent. Verified by 2 PICC RNs.   Annett Fabian 09/30/2023, 10:01 AM

## 2023-09-30 NOTE — Progress Notes (Signed)
eLink Physician-Brief Progress Note Patient Name: Chelsea [name] DOB: 07-Feb-1966 MRN: [id number]   Date of Service  09/30/2023  HPI/Events of Note    eICU Interventions     Low grade fever  Tylenol prn     Massie Maroon 09/30/2023, 12:54 AM

## 2023-10-01 ENCOUNTER — Encounter (HOSPITAL_COMMUNITY): Payer: Self-pay | Admitting: Internal Medicine

## 2023-10-01 ENCOUNTER — Inpatient Hospital Stay (HOSPITAL_COMMUNITY): Payer: Medicare Other

## 2023-10-01 DIAGNOSIS — J9622 Acute and chronic respiratory failure with hypercapnia: Secondary | ICD-10-CM | POA: Diagnosis not present

## 2023-10-01 DIAGNOSIS — R4182 Altered mental status, unspecified: Secondary | ICD-10-CM | POA: Diagnosis not present

## 2023-10-01 LAB — BASIC METABOLIC PANEL
Anion gap: 9 (ref 5–15)
BUN: 16 mg/dL (ref 6–20)
CO2: 31 mmol/L (ref 22–32)
Calcium: 8.5 mg/dL — ABNORMAL LOW (ref 8.9–10.3)
Chloride: 101 mmol/L (ref 98–111)
Creatinine, Ser: 0.62 mg/dL (ref 0.44–1.00)
GFR, Estimated: >60 mL/min (ref >60)
Glucose, Bld: 225 mg/dL — ABNORMAL HIGH (ref 70–99)
Potassium: 3.4 mmol/L — ABNORMAL LOW (ref 3.5–5.1)
Sodium: 141 mmol/L (ref 135–145)

## 2023-10-01 LAB — GLUCOSE, CAPILLARY
Glucose-Capillary: 213 mg/dL — ABNORMAL HIGH (ref 70–99)
Glucose-Capillary: 157 mg/dL — ABNORMAL HIGH (ref 70–99)
Glucose-Capillary: 161 mg/dL — ABNORMAL HIGH (ref 70–99)
Glucose-Capillary: 111 mg/dL — ABNORMAL HIGH (ref 70–99)
Glucose-Capillary: 143 mg/dL — ABNORMAL HIGH (ref 70–99)
Glucose-Capillary: 115 mg/dL — ABNORMAL HIGH (ref 70–99)

## 2023-10-01 LAB — CBC
HCT: 35.9 % — ABNORMAL LOW (ref 36.0–46.0)
Hemoglobin: 11 g/dL — ABNORMAL LOW (ref 12.0–15.0)
MCH: 27.2 pg (ref 26.0–34.0)
MCHC: 30.6 g/dL (ref 30.0–36.0)
MCV: 88.6 fL (ref 80.0–100.0)
Platelets: 298 10*3/uL (ref 150–400)
RBC: 4.05 MIL/uL (ref 3.87–5.11)
RDW: 15.6 % — ABNORMAL HIGH (ref 11.5–15.5)
WBC: 9.8 10*3/uL (ref 4.0–10.5)
nRBC: 0 % (ref 0.0–0.2)

## 2023-10-01 LAB — PHOSPHORUS: Phosphorus: 2.2 mg/dL — ABNORMAL LOW (ref 2.5–4.6)

## 2023-10-01 LAB — MAGNESIUM: Magnesium: 2.5 mg/dL — ABNORMAL HIGH (ref 1.7–2.4)

## 2023-10-01 MED ORDER — PANTOPRAZOLE SODIUM 40 MG PO TBEC
40.0000 mg | DELAYED_RELEASE_TABLET | Freq: Every day | ORAL | Status: DC
  Administered 2023-10-01 – 2023-10-06 (×6): 40 mg via ORAL
  Filled 2023-10-01 (×6): qty 1

## 2023-10-01 MED ORDER — FUROSEMIDE 10 MG/ML IJ SOLN: 40.0000 mg | Freq: Two times a day (BID) | INTRAMUSCULAR | Status: DC

## 2023-10-01 MED ORDER — FENTANYL CITRATE PF 50 MCG/ML IJ SOSY
50.0000 ug | PREFILLED_SYRINGE | Freq: Four times a day (QID) | INTRAMUSCULAR | Status: DC | PRN
  Administered 2023-10-02: 50 ug via INTRAVENOUS
  Filled 2023-10-01: qty 1

## 2023-10-01 MED ORDER — FENTANYL CITRATE PF 50 MCG/ML IJ SOSY: 50.0000 ug | PREFILLED_SYRINGE | Freq: Four times a day (QID) | INTRAMUSCULAR | Status: DC | PRN

## 2023-10-01 MED ORDER — OXYCODONE-ACETAMINOPHEN 5-325 MG PO TABS
1.0000 | ORAL_TABLET | Freq: Four times a day (QID) | ORAL | Status: DC | PRN
  Administered 2023-10-02 – 2023-10-07 (×9): 1 via ORAL
  Filled 2023-10-01 (×10): qty 1

## 2023-10-01 MED ORDER — LACTULOSE 10 GM/15ML PO SOLN
10.0000 g | Freq: Two times a day (BID) | ORAL | Status: DC
  Administered 2023-10-06: 10 g via ORAL
  Filled 2023-10-01 (×5): qty 15

## 2023-10-01 MED ORDER — AMIODARONE HCL 200 MG PO TABS
200.0000 mg | ORAL_TABLET | Freq: Every day | ORAL | Status: DC
  Administered 2023-10-02 – 2023-10-07 (×6): 200 mg via ORAL
  Filled 2023-10-01 (×6): qty 1

## 2023-10-01 MED ORDER — ROPINIROLE HCL 0.5 MG PO TABS
0.5000 mg | ORAL_TABLET | Freq: Every day | ORAL | Status: DC
  Administered 2023-10-01 – 2023-10-06 (×6): 0.5 mg via ORAL
  Filled 2023-10-01 (×6): qty 1

## 2023-10-01 MED ORDER — MONTELUKAST SODIUM 10 MG PO TABS
10.0000 mg | ORAL_TABLET | Freq: Every day | ORAL | Status: DC
  Administered 2023-10-01 – 2023-10-06 (×6): 10 mg via ORAL
  Filled 2023-10-01 (×6): qty 1

## 2023-10-01 MED ORDER — POLYETHYLENE GLYCOL 3350 17 G PO PACK: 17.0000 g | PACK | Freq: Every day | ORAL | Status: DC

## 2023-10-01 MED ORDER — OXYCODONE HCL 5 MG PO TABS
5.0000 mg | ORAL_TABLET | Freq: Four times a day (QID) | ORAL | Status: DC | PRN
  Administered 2023-10-01 – 2023-10-06 (×8): 5 mg via ORAL
  Filled 2023-10-01 (×10): qty 1

## 2023-10-01 MED ORDER — DOCUSATE SODIUM 50 MG/5ML PO LIQD: 100.0000 mg | Freq: Two times a day (BID) | ORAL | Status: DC

## 2023-10-01 MED ORDER — DOCUSATE SODIUM 100 MG PO CAPS: 100.0000 mg | ORAL_CAPSULE | Freq: Two times a day (BID) | ORAL | Status: DC | PRN

## 2023-10-01 MED ORDER — LEVOTHYROXINE SODIUM 75 MCG PO TABS
150.0000 ug | ORAL_TABLET | Freq: Every day | ORAL | Status: DC
Start: 2023-10-02 — End: 2023-10-07
  Administered 2023-10-02 – 2023-10-07 (×6): 150 ug via ORAL
  Filled 2023-10-01 (×6): qty 2

## 2023-10-01 MED ORDER — IPRATROPIUM-ALBUTEROL 0.5-2.5 (3) MG/3ML IN SOLN: 3.0000 mL | Freq: Four times a day (QID) | RESPIRATORY_TRACT | Status: DC | PRN

## 2023-10-01 MED ORDER — ALPRAZOLAM 0.5 MG PO TABS
0.5000 mg | ORAL_TABLET | Freq: Two times a day (BID) | ORAL | Status: DC | PRN
  Administered 2023-10-01 – 2023-10-06 (×7): 0.5 mg via ORAL
  Filled 2023-10-01 (×7): qty 1

## 2023-10-01 MED ORDER — POTASSIUM CHLORIDE CRYS ER 20 MEQ PO TBCR
40.0000 meq | EXTENDED_RELEASE_TABLET | Freq: Once | ORAL | Status: AC
  Administered 2023-10-01: 40 meq via ORAL
  Filled 2023-10-01: qty 2

## 2023-10-01 MED ORDER — FENTANYL CITRATE PF 50 MCG/ML IJ SOSY
50.0000 ug | PREFILLED_SYRINGE | INTRAMUSCULAR | Status: DC | PRN
  Administered 2023-10-01 (×2): 50 ug via INTRAVENOUS
  Filled 2023-10-01 (×2): qty 1

## 2023-10-01 MED ORDER — ACETAMINOPHEN 160 MG/5ML PO SOLN: 650.0000 mg | Freq: Four times a day (QID) | ORAL | Status: DC | PRN

## 2023-10-01 MED ORDER — POTASSIUM CHLORIDE CRYS ER 20 MEQ PO TBCR: 40.0000 meq | EXTENDED_RELEASE_TABLET | Freq: Once | ORAL | Status: DC

## 2023-10-01 MED ORDER — OXYCODONE-ACETAMINOPHEN 10-325 MG PO TABS: 1.0000 | ORAL_TABLET | Freq: Four times a day (QID) | ORAL | Status: DC | PRN

## 2023-10-01 MED ORDER — SODIUM CHLORIDE 0.9 % IV SOLN
30.0000 mmol | Freq: Once | INTRAVENOUS | Status: AC
  Administered 2023-10-01: 30 mmol via INTRAVENOUS
  Filled 2023-10-01: qty 10

## 2023-10-01 MED ORDER — POTASSIUM CHLORIDE 20 MEQ PO PACK
40.0000 meq | PACK | Freq: Once | ORAL | Status: AC
Start: 2023-10-01 — End: 2023-10-01
  Administered 2023-10-01: 40 meq
  Filled 2023-10-01: qty 2

## 2023-10-01 MED ORDER — POLYETHYLENE GLYCOL 3350 17 G PO PACK
17.0000 g | PACK | Freq: Every day | ORAL | Status: DC | PRN
Start: 2023-10-01 — End: 2023-10-07

## 2023-10-01 MED ORDER — ATORVASTATIN CALCIUM 10 MG PO TABS
20.0000 mg | ORAL_TABLET | Freq: Every evening | ORAL | Status: DC
  Administered 2023-10-01 – 2023-10-06 (×6): 20 mg via ORAL
  Filled 2023-10-01 (×6): qty 2

## 2023-10-01 MED ORDER — POTASSIUM CHLORIDE 20 MEQ PO PACK
40.0000 meq | PACK | ORAL | Status: DC
  Filled 2023-10-01: qty 2

## 2023-10-01 MED ORDER — FUROSEMIDE 10 MG/ML IJ SOLN
60.0000 mg | Freq: Two times a day (BID) | INTRAMUSCULAR | Status: DC
  Administered 2023-10-01 – 2023-10-02 (×3): 60 mg via INTRAVENOUS
  Filled 2023-10-01 (×3): qty 6

## 2023-10-01 NOTE — Progress Notes (Signed)
 PCCM Interval Progress Note:  Extubated to BiPAP, tolerated well. Will allow for diuresis then can attempt to wean/remove BiPAP.  Chelsea [name] Jamarius Saha, PA-C [place] [facility] 10/01/23 9:57 AM  Please see [email or web address] for pager details.  From 7A-7P if no response, please call [phone] After hours, please call ELink [phone]

## 2023-10-01 NOTE — Progress Notes (Addendum)
 NAME:  Chelsea [name], MRN:  [id number], DOB:  [birth year], LOS: 2 ADMISSION DATE:  09/29/2023 CONSULTATION DATE:  09/30/2023 REFERRING MD:  Emil - EDP CHIEF COMPLAINT:  AMS, respiratory failure   History of Present Illness:  57 year old woman who presented to [facility] ED 12/29 from SNF for AMS, respiratory failure. PMHx significant for HTN, HLD, HFpEF (Echo 05/2023 EF 60-65%, normal LV/RV systolic function, degenerative MV, moderate AS), AFlutter (on Eliqjuis), OSA/OHS, COPD with chronic respiratory failure (previously trach-dependent, since decannulated), T2DM, hypothyroidism, GERD, OA, RLS, chronic pain, depression/anxiety. Recent admission to [facility] 12/19-12/24 for CHF exacerbation and hypercarbia; multiple admissions for similar (4) throughout the course of 2024.  Presented to [facility] from SNF 12/29 for AMS, respiratory failure. Reportedly requiring near-constant CPAP since discharge from [facility] 12/24, found somnolent and not wearing CPAP mask. With EMS, GCS 3 requiring OPA placement and BVM ventilation assistance. Intubated on ED arrival. CT Head NAICA. CXR with low lung volumes, otherwise stable. Labs were notable for WBC 14.8, Hgb 11.5 (baseline), Plt 301. Na 135, K 5.2, CO2 29, Cr 1.18 (baseline 0.8), glucose 433. AST/ALT 50/24, Alk Phos/Tbili WNL. Trop WNL. BNP 185.1. TSH/T4 WNL. Ammonia mildly elevated 51. PCT 0.17. LA 3.1 > 2.5. VBG pH 7.324/pCO2 74.1. UDS negative. UA glucose > 500, prot 30, rare bacteria.   PCCM consulted for ICU admission.  Pertinent Medical History:   Past Medical History:  Diagnosis Date   Acquired hypothyroidism 11/13/2010   Qualifier: Diagnosis of   By: Pietro, MD, CODY Redell Dimes      Acute congestive heart failure (HCC) 03/18/2021   Acute idiopathic gout of left hand 02/16/2019   Acute kidney injury (HCC) 05/29/2023   Acute on chronic diastolic CHF (congestive heart failure), NYHA class 3 (HCC) 09/25/2017   Acute on chronic respiratory failure with hypoxia  and hypercapnia (HCC) 04/07/2022   Arthritis    qwhere (12/05/2017)   Asthma    Atrial flutter (HCC) 03/09/2021   Bleeding of the respiratory tract 04/07/2022   Bronchitis 05/30/2011   Cervical cancer (HCC) 2006   CHF exacerbation (HCC) 07/30/2022   Chronic heart failure with preserved ejection fraction (HCC) 02/23/2018   Chronic lower back pain    Chronic respiratory failure with hypoxia (HCC) 07/02/2018   ABG 11/2017 7.36/69   Consistent with obesity hypoventilation syndrome   Complication of anesthesia    I have a hard time waking up from under it (12/05/2017)   COPD (chronic obstructive pulmonary disease) (HCC) 03/04/2015   Coronary artery disease    Cough productive of clear sputum 06/03/2023   Diarrhea 06/18/2019   DJD (degenerative joint disease) 07/03/2021   DM2 (diabetes mellitus, type 2) (HCC) 11/10/2010   Dyspnea 12/05/2017   Dyspnea on exertion 02/09/2021   Edema 03/22/2011   Essential (primary) hypertension 11/10/2010   Formatting of this note might be different from the original.  Overview:   Qualifier: Diagnosis of   By: Gwenn Grimes      Last Assessment & Plan:   Watch blood pressure off of minoxidil and add medications as needed.   Essential hypertension 11/10/2010   Qualifier: Diagnosis of   By: Gwenn Grimes       Gastro-esophageal reflux disease without esophagitis 11/10/2010   GERD 11/10/2010   Qualifier: Diagnosis of   By: Gwenn Grimes       Hair loss 09/17/2019   Heart failure (HCC) 04/20/2021   Heart murmur    Hepatitis C    History of cervical cancer 03/22/2011  History of gout X 1   History of tobacco use 04/20/2021   Hx of atrial flutter 03/15/2021   Hyperglycemia due to type 2 diabetes mellitus (HCC) 04/20/2021   Hyperlipidemia    Hypertension associated with diabetes (HCC) 08/25/2018   Irritable bowel syndrome with diarrhea 06/18/2019   Limitation of activity due to disability 09/18/2021   Lymphedema of both lower extremities  03/22/2011   Metabolic alkalosis 03/28/2021   Microalbuminuria 04/20/2021   Migraine    monthly (12/05/2017)   Mild tricuspid regurgitation 04/02/2021   Mitral valve sclerosis 04/02/2021   Mixed hyperlipidemia 04/18/2020   Moderate aortic stenosis 04/20/2021   Mononeuropathy of lower extremity 04/09/2011   Formatting of this note might be different from the original.  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  Severe, corrected by CPAP 18 with C-flex of 3  2/13 bipap 20/14 sm ff mask h/h biflex+3cm  Formatting of this note might be different from the original.  Sleep study 06/2020: IMPRESSIONS  - Moderate obstructive sleep apnea occurred during this study (AHI = 17.7/h), mostly REM related  - No significant central sleep apnea occurred during   On home oxygen  therapy    2L all the time (12/05/2017)   On supplemental oxygen  by nasal cannula 04/20/2021   Onychomycosis 06/18/2019   OSA treated with BiPAP    have CPAP at home too; wearing BiPAP right now (12/05/2017)   Osteoarthritis 11/28/2010   Qualifier: Diagnosis of   By: Lelon RIGGERS, Scott       Other long term (current) drug therapy 02/12/2022   Paroxysmal atrial fibrillation (HCC) 09/04/2021   Physical debility 05/10/2018   Formatting of this note might be different from the original.  Due to morbid obesity    Physical deconditioning 04/24/2021   Pneumonia    several times (12/05/2017)   Pneumonia due to gram-positive bacteria 08/24/2017   Pressure injury of both heels, unstageable (HCC) 07/03/2021   Pulmonary edema, acute (HCC) 02/21/2022   Renal insufficiency 04/20/2021   Restless leg syndrome 03/22/2021   Restrictive lung disease secondary to obesity 01/28/2022   S/P hysterectomy 02/19/2021   Tinea pedis of both feet 11/12/2018   Unspecified atrial flutter (HCC) 02/12/2022   Upper GI bleed 07/18/2022   Venous insufficiency of both lower extremities 07/02/2021   Weakness 03/20/2021   Weight loss 03/15/2021   Significant Hospital Events: Including procedures, antibiotic start and stop dates in addition to other pertinent events   12/29 - Admitted for acute hypoxemic and hypercarbic respiratory failure and AMS. Intubated. 12/30 - EEG with moderate diffuse encephalopathy, no seizures/epileptiform discharges. PICC placed.  Interim History / Subjective:  No significant overnight events Mental status significantly improved, mildly anxious off of Precedex  but nodding appropriately to questions and awake/alert Max Levo 2mcg/24H, currently off Plan for likely extubation to BiPAP today, 12/31 TF held Phos/K repleted Diuresis  BID titrating up to home doses  Objective:  Blood pressure 124/74, pulse 78, temperature 98.1 F (36.7 C), resp. rate 20, height 5' 5 (1.651 m), weight (!) 154.6 kg, SpO2 (!) 87%.    Vent Mode: PSV;CPAP FiO2 (%):  [30 %-35 %] 30 % Set Rate:  [26 bmp] 26 bmp Vt Set:  [450 mL] 450 mL PEEP:  [8 cmH20] 8 cmH20 Pressure Support:  [8 cmH20] 8 cmH20 Plateau Pressure:  [24 cmH20-29 cmH20] 26 cmH20   Intake/Output Summary (Last 24 hours) at 10/01/2023 0905 Last data filed at 10/01/2023 0800 Gross per 24 hour  Intake 963.08 ml  Output 2600 ml  Net -1636.92 ml   Filed Weights   09/29/23 1322 09/30/23 0500 10/01/23 0500  Weight: (!) 150 kg (!) 157.8 kg (!) 154.6 kg    Physical Examination: General: Chronically ill-appearing middle-aged woman in NAD. HEENT: [place]/AT, anicteric sclera, PERRL, moist mucous membranes. Neuro:  Awake/alert, nodding appropriately to questions.  Responds to verbal stimuli. Following commands consistently. Moves all 4 extremities spontaneously. Generalized weakness. +Cough and +Gag  CV: RRR, +III/VI systolic murmur. PULM: Breathing even and unlabored on vent (PSV 8/8, FiO2 35%). Lung fields diminished at bases, otherwise clear to auscultation. GI: Obese, soft, nontender, nondistended. Normoactive bowel sounds. Extremities: Bilateral chronic 2+ LE edema noted with venous stasis changes and mild chronic erythema. Skin: Warm/dry, BLE as described above.  Resolved Hospital Problem List:    Assessment & Plan:  Acute-on-chronic hypoxemic and hypercarbic respiratory failure OSA/OHS COPD with chronic respiratory failure - Weaning on vent this morning, tolerating well - Wean FiO2 for O2 sat > 90% - Daily WUA/SBT as vent setting/mental status tolerate, plan for extubation today 12/31 - Bronchodilators (Pulmicort , DuoNebs) - Continue Singulair  - VAP bundle - Pulmonary hygiene - PAD protocol for sedation: Precedex , Fentanyl , and Versed  for goal RASS 0 to -1  Undifferentiated shock, presume septic/respiratory source, improving - Goal MAP > 65 - Fluid resuscitation as tolerated - Levophed  titrated to goal MAP - Trend WBC, fever curve - F/u Cx data prelim rare GNRs - RVP/strep pneumo negative, legionella pending - Continue ceftriaxone   HTN HLD HFpEF Echo 05/2023 EF 60-65%, normal LV/RV systolic function, degenerative MV, moderate AS. - Continue statin - Resume Jardiance  - Diuresis as tolerated, begin gently with Lasix  BID + spironolactone  resumption - Uptitrate to home torsemide  dose  AFlutter (on Eliqjuis) CHA2DS2-VASc Score 4. - Continue amiodarone  - Cardiac monitoring - Optimize electrolytes for K > 4, Mg > 2 - Lovenox   for AC at present, previously on Eliquis   T2DM - Basal Semglee  30U QHS - SSI - CBGs Q4H - Goal CBG 140-180  Hypokalemia Hypophosphatemia - Trend BMP, phos - Replete electrolytes as indicated - Monitor I&Os in the setting of diuresis  Hypothyroidism - Continue levothyroxine   GERD - PPI  OA RLS - Continue Requip   Depression Anxiety - PAD protocol as above  Best Practice: (right click and Reselect all SmartList Selections daily)   Diet/type: NPO DVT prophylaxis: LMWH GI prophylaxis: PPI Lines: Central line Foley:  Yes, and it is still needed Code Status:  full code Last date of multidisciplinary goals of care discussion [Pending]  Critical care time:   The patient is critically ill with multiple organ system failure and requires high complexity decision making for assessment and support, frequent evaluation and titration of therapies, advanced monitoring, review of radiographic studies and interpretation of complex data.   Critical Care Time devoted to patient care services, exclusive of separately billable procedures, described  in this note is 37 minutes.  Corean CHRISTELLA Virgie Kunda, PA-C [facility] 10/01/23 9:05 AM  Please see [email or web address] for pager details.  From 7A-7P if no response, please call [phone] After hours, please call ELink [phone]

## 2023-10-01 NOTE — Progress Notes (Signed)
 [facility] ADULT ICU REPLACEMENT PROTOCOL   The patient does apply for the [facility] Adult ICU Electrolyte Replacment Protocol based on the criteria listed below:   1.Exclusion criteria: TCTS, ECMO, Dialysis, and Myasthenia Gravis patients 2. Is GFR >/= 30 ml/min? Yes.    Patient's GFR today is >60 3. Is SCr </= 2? Yes.   Patient's SCr is 0.62 mg/dL 4. Did SCr increase >/= 0.5 in 24 hours? No. 5.Pt's weight >40kg  Yes.   6. Abnormal electrolyte(s):   K 3.4  7. Electrolytes replaced per protocol 8.  Call MD STAT for K+ </= 2.5, Phos </= 1, or Mag </= 1 Physician:  A. Haze Harlene JONELLE Viviano 10/01/2023 5:39 AM

## 2023-10-01 NOTE — Procedures (Signed)
 Extubation Procedure Note  Patient Details:   Name: NYJA WESTBROOK DOB: 05-Mar-1966 MRN: [id number]   Airway Documentation:    Vent end date: 10/01/23 Vent end time: 0943   Evaluation  O2 sats: stable throughout Complications: No apparent complications Patient did tolerate procedure well. Bilateral Breath Sounds: Expiratory wheezes, Diminished   Yes, pt could speak post extubation.  Pt extubted successfully to bipap.   Ozell KATHEE Blase 10/01/2023, 9:45 AM

## 2023-10-01 NOTE — Progress Notes (Signed)
   10/01/23 2310  BiPAP/CPAP/SIPAP  $ Non-Invasive Ventilator  Non-Invasive Vent Subsequent  BiPAP/CPAP/SIPAP Pt Type Adult  BiPAP/CPAP/SIPAP SERVO  Mask Type Full face mask  Mask Size Medium  Set Rate 14 breaths/min  Respiratory Rate 23 breaths/min  IPAP 16 cmH20  EPAP 8 cmH2O  PEEP 8 cmH20  FiO2 (%) 35 %  Minute Ventilation 13.2  Leak 34  Peak Inspiratory Pressure (PIP) 17  Tidal Volume (Vt) 56  Patient Home Equipment No  Auto Titrate No  Press High Alarm 35 cmH2O  Press Low Alarm 5 cmH2O

## 2023-10-02 ENCOUNTER — Telehealth: Payer: Self-pay | Admitting: Internal Medicine

## 2023-10-02 DIAGNOSIS — G9340 Encephalopathy, unspecified: Secondary | ICD-10-CM | POA: Diagnosis not present

## 2023-10-02 DIAGNOSIS — J9622 Acute and chronic respiratory failure with hypercapnia: Secondary | ICD-10-CM | POA: Diagnosis not present

## 2023-10-02 DIAGNOSIS — J9621 Acute and chronic respiratory failure with hypoxia: Secondary | ICD-10-CM | POA: Diagnosis not present

## 2023-10-02 LAB — BASIC METABOLIC PANEL
Anion gap: 10 (ref 5–15)
BUN: 14 mg/dL (ref 6–20)
CO2: 34 mmol/L — ABNORMAL HIGH (ref 22–32)
Calcium: 8.5 mg/dL — ABNORMAL LOW (ref 8.9–10.3)
Chloride: 98 mmol/L (ref 98–111)
Creatinine, Ser: 0.74 mg/dL (ref 0.44–1.00)
GFR, Estimated: >60 mL/min (ref >60)
Glucose, Bld: 118 mg/dL — ABNORMAL HIGH (ref 70–99)
Potassium: 4 mmol/L (ref 3.5–5.1)
Sodium: 142 mmol/L (ref 135–145)

## 2023-10-02 LAB — GLUCOSE, CAPILLARY
Glucose-Capillary: 113 mg/dL — ABNORMAL HIGH (ref 70–99)
Glucose-Capillary: 126 mg/dL — ABNORMAL HIGH (ref 70–99)
Glucose-Capillary: 169 mg/dL — ABNORMAL HIGH (ref 70–99)
Glucose-Capillary: 235 mg/dL — ABNORMAL HIGH (ref 70–99)
Glucose-Capillary: 249 mg/dL — ABNORMAL HIGH (ref 70–99)
Glucose-Capillary: 218 mg/dL — ABNORMAL HIGH (ref 70–99)

## 2023-10-02 LAB — CBC
HCT: 37.3 % (ref 36.0–46.0)
Hemoglobin: 11.1 g/dL — ABNORMAL LOW (ref 12.0–15.0)
MCH: 26.3 pg (ref 26.0–34.0)
MCHC: 29.8 g/dL — ABNORMAL LOW (ref 30.0–36.0)
MCV: 88.4 fL (ref 80.0–100.0)
Platelets: 296 10*3/uL (ref 150–400)
RBC: 4.22 MIL/uL (ref 3.87–5.11)
RDW: 16 % — ABNORMAL HIGH (ref 11.5–15.5)
WBC: 8.7 10*3/uL (ref 4.0–10.5)
nRBC: 0 % (ref 0.0–0.2)

## 2023-10-02 LAB — CULTURE, RESPIRATORY W GRAM STAIN

## 2023-10-02 LAB — PHOSPHORUS: Phosphorus: 5 mg/dL — ABNORMAL HIGH (ref 2.5–4.6)

## 2023-10-02 LAB — MAGNESIUM: Magnesium: 2.3 mg/dL (ref 1.7–2.4)

## 2023-10-02 MED ORDER — MENTHOL 3 MG MT LOZG
1.0000 | LOZENGE | OROMUCOSAL | Status: DC | PRN
  Administered 2023-10-02: 3 mg via ORAL
  Filled 2023-10-02: qty 9

## 2023-10-02 MED ORDER — APIXABAN 5 MG PO TABS
5.0000 mg | ORAL_TABLET | Freq: Two times a day (BID) | ORAL | Status: DC
  Administered 2023-10-02 – 2023-10-07 (×10): 5 mg via ORAL
  Filled 2023-10-02 (×10): qty 1

## 2023-10-02 MED ORDER — BUPROPION HCL ER (SR) 100 MG PO TB12
100.0000 mg | ORAL_TABLET | Freq: Two times a day (BID) | ORAL | Status: DC
  Administered 2023-10-02 – 2023-10-07 (×11): 100 mg via ORAL
  Filled 2023-10-02 (×13): qty 1

## 2023-10-02 MED ORDER — TORSEMIDE 20 MG PO TABS
60.0000 mg | ORAL_TABLET | Freq: Two times a day (BID) | ORAL | Status: DC
  Administered 2023-10-02 – 2023-10-07 (×10): 60 mg via ORAL
  Filled 2023-10-02 (×10): qty 3

## 2023-10-02 NOTE — Hospital Course (Addendum)
 Chelsea [name] is a 58 y.o.female with a history of HTN, HLD, HFpEF, Aflutter, OSA/OHS, COPD w/ Chronic respiratory failure who was admitted to the ICU, and then to [facility] at [facility] for metabolic encephalopathy, acute on chronic respiratory failure, and undifferentiated shock. Her hospital course is detailed below:  Acute Metabolic Encephalopathy Likely secondary to CO2 narcosis. Resolved following extubation. Patient was at baseline mental status on discharge from the ICU.  Acute on Chronic Respiratory Failure with hypoxia and hypercapnia Patient was initially intubated on admission in the ICU. She was placed on BiPAP following extubation. On transfer out of the ICU she was tolerating home O2 requirement during the day, and BiPAP at night. A VBG was ordered which showed ongoing mild hypercapnia and hypercarbia. PCCM recommended daytime BiPAP with naps for her outpatient. She was stable on her home 5 L of oxygen  at discharge.  Started her on Trelegy upon discharge instead of Pulmicort  to help manage her symptoms better. Upon discharge her BIPAP settings were IPAP 10 / EPAP 5, FiO2 40%.   Undifferentiated Shock Patient had low blood pressures in the ICU requiring pressor support. Urine and blood cultures were negative for infectious source. Patient was treated with 4 days of ceftriaxone , 1g q24h. She was weaned off pressors before coming to the floor.  Other chronic conditions were medically managed with home medications and formulary alternatives as necessary: Neuropathy: Held gabapentin  inpatient due to sedative effect HFpEF: Diuresed with Lasix  BID. Switched to Torsemide .  Held spironolactone  while inpatient. Anxiety: Continued Xanax  0.5 mg, Wellbutrin  100 mg twice daily, and started on Claritin  due to anxiety and itching she endorsed. T2DM: Increased her insulin  to 35 units long-acting daily.  Held Trulicity and Jardiance  while inpatient. Hypothyroidism: Continued  Synthroid  during admission  PCP Follow-up Recommendations: Consider restarting Spironolactone  once BP stabilizes. Ensure she gets her sleep study with Pulmonology on 2/10 at 11:30 AM Recommend GLP-1 outpatient - consider Tirzepatide  or max on Trulicity

## 2023-10-02 NOTE — Progress Notes (Signed)
 PT Cancellation Note  Patient Details Name: Chelsea [name] MRN: [id number] DOB: 04/09/1966   Cancelled Treatment:    Reason Eval/Treat Not Completed: Other (comment). Pt having a bowel movement upon PT arrival. PT will attempt to return as time allows.   Bernardino JINNY Ruth 10/02/2023, 4:25 PM

## 2023-10-02 NOTE — Evaluation (Signed)
 Occupational Therapy Evaluation Patient Details Name: Chelsea [name] MRN: [id number] DOB: Jan 31, 1966 Today's Date: 10/02/2023   History of Present Illness Pt is a 58 y/o F presenting to ED from [facility] on 12/29 with AMS and respiratory failure. EEG with moderate diffuse encephalopathy. Recently d/c'd on 12/24 after being admitted for CHF and hypercarbia, multiple admissions for similar throughout 2024. PMH includes metabolic syndrome, heart failure, HTN, a flutter on AC, chronic hypoxemic and hypercapneic respiratory failure, OSA, OHS, COPD, chronic pain   Clinical Impression   Pt reports being at SNF for over a year, was working with therapies on sitting EOB. Pt reports performing ADLs at w/c and/or bed level, has assist for toileting and dressing tasks. Reports using AE for bathing. Pt currently declines EOB/OOB mobility, needs max +2 for scooting up in bed. Pt completing bed level grooming tasks with set up A, needs up to total A for ADLs. Pt presenting with impairments listed below, will follow acutely. Patient will benefit from continued inpatient follow up therapy, <3 hours/day to maximize safety/ind with ADL/functional mobility.        If plan is discharge home, recommend the following: Two people to help with walking and/or transfers;A lot of help with bathing/dressing/bathroom;Assistance with cooking/housework;Assist for transportation;Help with stairs or ramp for entrance    Functional Status Assessment  Patient has had a recent decline in their functional status and demonstrates the ability to make significant improvements in function in a reasonable and predictable amount of time.  Equipment Recommendations  Other (comment) (defer)    Recommendations for Other Services       Precautions / Restrictions Precautions Precautions: Fall Precaution Comments: 5LO2 baseline      Mobility Bed Mobility Overal bed mobility: Needs Assistance              General bed mobility comments: max A +3 to scoot up in bed , pt declines EOB    Transfers                   General transfer comment: pt declines      Balance Overall balance assessment:  (unable to assess, bed level evaluation)                                         ADL either performed or assessed with clinical judgement   ADL Overall ADL's : Needs assistance/impaired Eating/Feeding: Set up   Grooming: Set up;Bed level Grooming Details (indicate cue type and reason): cleaning ears with q-tips and washing face Upper Body Bathing: Moderate assistance;Bed level   Lower Body Bathing: Maximal assistance;Bed level   Upper Body Dressing : Moderate assistance;Bed level   Lower Body Dressing: Maximal assistance;Bed level   Toilet Transfer: Maximal assistance;+2 for physical assistance   Toileting- Clothing Manipulation and Hygiene: Total assistance       Functional mobility during ADLs: Maximal assistance;+2 for physical assistance       Vision   Vision Assessment?: No apparent visual deficits     Perception Perception: Not tested       Praxis Praxis: Not tested       Pertinent Vitals/Pain Pain Assessment Pain Assessment: Faces Pain Score: 6  Faces Pain Scale: Hurts even more Pain Location: BLEs Pain Descriptors / Indicators: Discomfort     Extremity/Trunk Assessment Upper Extremity Assessment Upper Extremity Assessment: Generalized weakness (reports hx of arthritis in L  shoulder)   Lower Extremity Assessment Lower Extremity Assessment: Defer to PT evaluation       Communication Communication Communication: No apparent difficulties   Cognition Arousal: Alert Behavior During Therapy: WFL for tasks assessed/performed, Flat affect Overall Cognitive Status: Within Functional Limits for tasks assessed                                 General Comments: decr motivation     General Comments  VSS on 5L O2     Exercises     Shoulder Instructions      Home Living Family/patient expects to be discharged to:: Skilled nursing facility                                 Additional Comments: Has been in and  out of [facility] for the past year      Prior Functioning/Environment               Mobility Comments: hoyer lift OOB to w/c, but working on LE WB in transfer attempts with therapies at rehab. Pt reports indep with rolling. gets OOB to w/c 2-3x/week ADLs Comments: does most of bathing on her own at bed level, uses AE for LB bathing, reports has set up A for dressing/bathing, uses bedpan for toileting        OT Problem List: Decreased strength;Decreased range of motion;Impaired balance (sitting and/or standing);Decreased activity tolerance;Cardiopulmonary status limiting activity;Impaired UE functional use;Decreased knowledge of use of DME or AE;Decreased knowledge of precautions;Decreased coordination      OT Treatment/Interventions: Self-care/ADL training;Therapeutic exercise;Energy conservation;DME and/or AE instruction;Therapeutic activities;Patient/family education;Balance training;Cognitive remediation/compensation    OT Goals(Current goals can be found in the care plan section) Acute Rehab OT Goals Patient Stated Goal: none stated OT Goal Formulation: With patient Time For Goal Achievement: 10/16/23 Potential to Achieve Goals: Good ADL Goals Pt Will Perform Grooming: with supervision;sitting Pt Will Perform Upper Body Bathing: with supervision;sitting;bed level Pt Will Perform Lower Body Bathing: with supervision;sitting/lateral leans;bed level Pt Will Transfer to Toilet: with mod assist;with +2 assist;squat pivot transfer;stand pivot transfer;bedside commode Additional ADL Goal #1: pt wil perform bed mobility min A in prep for ADL  OT Frequency: Min 1X/week    Co-evaluation              AM-PAC OT 6 Clicks Daily Activity     Outcome Measure  Help from another person eating meals?: A Little Help from another person taking care of personal grooming?: A Little Help from another person toileting, which includes using toliet, bedpan, or urinal?: A Lot Help from another person bathing (including washing, rinsing, drying)?: A Lot Help from another person to put on and taking off regular upper body clothing?: A Lot Help from another person to put on and taking off regular lower body clothing?: A Lot 6 Click Score: 14   End of Session Equipment Utilized During Treatment: Oxygen  Nurse Communication: Mobility status  Activity Tolerance: Patient tolerated treatment well Patient left: in bed;with call bell/phone within reach;with nursing/sitter in room  OT Visit Diagnosis: Unsteadiness on feet (R26.81);Other abnormalities of gait and mobility (R26.89);Muscle weakness (generalized) (M62.81)                Time: 1540-1601 OT Time Calculation (min): 21 min Charges:  OT General Charges $OT Visit: 1 Visit OT Evaluation $OT Eval Low Complexity:  1 Low  Maryruth Apple K, OTD, OTR/L SecureChat Preferred Acute Rehab [phone]   Laneta MARLA Pereyra 10/02/2023, 4:40 PM

## 2023-10-02 NOTE — Progress Notes (Signed)
 Pt refusing lactulose. Pt educated on use for lowering ammonia level. Pt with many BM. M Ramaswamy. MD made aware.

## 2023-10-02 NOTE — Plan of Care (Signed)
   Problem: Coping: Goal: Ability to adjust to condition or change in health will improve Outcome: Progressing

## 2023-10-02 NOTE — Progress Notes (Signed)
 NAME:  Chelsea [name], MRN:  [id number], DOB:  11-Jul-1966, LOS: 3 ADMISSION DATE:  09/29/2023 CONSULTATION DATE:  09/30/2023 REFERRING MD:  Emil - EDP CHIEF COMPLAINT:  AMS, respiratory failure   History of Present Illness:  58 year old woman who presented to [facility] ED 12/29 from SNF for AMS, respiratory failure. PMHx significant for HTN, HLD, HFpEF (Echo 05/2023 EF 60-65%, normal LV/RV systolic function, degenerative MV, moderate AS), AFlutter (on Eliqjuis), OSA/OHS, COPD with chronic respiratory failure (previously trach-dependent, since decannulated), T2DM, hypothyroidism, GERD, OA, RLS, chronic pain, depression/anxiety. Recent admission to [facility] 12/19-12/24 for CHF exacerbation and hypercarbia; multiple admissions for similar (4) throughout the course of 2024.  Presented to [facility] from SNF 12/29 for AMS, respiratory failure. Reportedly requiring near-constant CPAP since discharge from [facility] 12/24, found somnolent and not wearing CPAP mask. With EMS, GCS 3 requiring OPA placement and BVM ventilation assistance. Intubated on ED arrival. CT Head NAICA. CXR with low lung volumes, otherwise stable. Labs were notable for WBC 14.8, Hgb 11.5 (baseline), Plt 301. Na 135, K 5.2, CO2 29, Cr 1.18 (baseline 0.8), glucose 433. AST/ALT 50/24, Alk Phos/Tbili WNL. Trop WNL. BNP 185.1. TSH/T4 WNL. Ammonia mildly elevated 51. PCT 0.17. LA 3.1 > 2.5. VBG pH 7.324/pCO2 74.1. UDS negative. UA glucose > 500, prot 30, rare bacteria.   PCCM consulted for ICU admission.  Pertinent Medical History:   Past Medical History:  Diagnosis Date   Acquired hypothyroidism 11/13/2010   Qualifier: Diagnosis of   By: Pietro, MD, CODY Redell Dimes      Acute congestive heart failure (HCC) 03/18/2021   Acute idiopathic gout of left hand 02/16/2019   Acute kidney injury (HCC) 05/29/2023   Acute on chronic diastolic CHF (congestive heart failure), NYHA class 3 (HCC) 09/25/2017   Acute on chronic respiratory failure with hypoxia  and hypercapnia (HCC) 04/07/2022   Arthritis    qwhere (12/05/2017)   Asthma    Atrial flutter (HCC) 03/09/2021   Bleeding of the respiratory tract 04/07/2022   Bronchitis 05/30/2011   Cervical cancer (HCC) 2006   CHF exacerbation (HCC) 07/30/2022   Chronic heart failure with preserved ejection fraction (HCC) 02/23/2018   Chronic lower back pain    Chronic respiratory failure with hypoxia (HCC) 07/02/2018   ABG 11/2017 7.36/69   Consistent with obesity hypoventilation syndrome   Complication of anesthesia    I have a hard time waking up from under it (12/05/2017)   COPD (chronic obstructive pulmonary disease) (HCC) 03/04/2015   Coronary artery disease    Cough productive of clear sputum 06/03/2023   Diarrhea 06/18/2019   DJD (degenerative joint disease) 07/03/2021   DM2 (diabetes mellitus, type 2) (HCC) 11/10/2010   Dyspnea 12/05/2017   Dyspnea on exertion 02/09/2021   Edema 03/22/2011   Essential (primary) hypertension 11/10/2010   Formatting of this note might be different from the original.  Overview:   Qualifier: Diagnosis of   By: Gwenn Grimes      Last Assessment & Plan:   Watch blood pressure off of minoxidil and add medications as needed.   Essential hypertension 11/10/2010   Qualifier: Diagnosis of   By: Gwenn Grimes       Gastro-esophageal reflux disease without esophagitis 11/10/2010   GERD 11/10/2010   Qualifier: Diagnosis of   By: Gwenn Grimes       Hair loss 09/17/2019   Heart failure (HCC) 04/20/2021   Heart murmur    Hepatitis C    History of cervical cancer 03/22/2011  History of gout X 1   History of tobacco use 04/20/2021   Hx of atrial flutter 03/15/2021   Hyperglycemia due to type 2 diabetes mellitus (HCC) 04/20/2021   Hyperlipidemia    Hypertension associated with diabetes (HCC) 08/25/2018   Irritable bowel syndrome with diarrhea 06/18/2019   Limitation of activity due to disability 09/18/2021   Lymphedema of both lower extremities  03/22/2011   Metabolic alkalosis 03/28/2021   Microalbuminuria 04/20/2021   Migraine    monthly (12/05/2017)   Mild tricuspid regurgitation 04/02/2021   Mitral valve sclerosis 04/02/2021   Mixed hyperlipidemia 04/18/2020   Moderate aortic stenosis 04/20/2021   Mononeuropathy of lower extremity 04/09/2011   Formatting of this note might be different from the original.  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  Severe, corrected by CPAP 18 with C-flex of 3  2/13 bipap 20/14 sm ff mask h/h biflex+3cm  Formatting of this note might be different from the original.  Sleep study 06/2020: IMPRESSIONS  - Moderate obstructive sleep apnea occurred during this study (AHI = 17.7/h), mostly REM related  - No significant central sleep apnea occurred during   On home oxygen  therapy    2L all the time (12/05/2017)   On supplemental oxygen  by nasal cannula 04/20/2021   Onychomycosis 06/18/2019   OSA treated with BiPAP    have CPAP at home too; wearing BiPAP right now (12/05/2017)   Osteoarthritis 11/28/2010   Qualifier: Diagnosis of   By: Lelon RIGGERS, Scott       Other long term (current) drug therapy 02/12/2022   Paroxysmal atrial fibrillation (HCC) 09/04/2021   Physical debility 05/10/2018   Formatting of this note might be different from the original.  Due to morbid obesity    Physical deconditioning 04/24/2021   Pneumonia    several times (12/05/2017)   Pneumonia due to gram-positive bacteria 08/24/2017   Pressure injury of both heels, unstageable (HCC) 07/03/2021   Pulmonary edema, acute (HCC) 02/21/2022   Renal insufficiency 04/20/2021   Restless leg syndrome 03/22/2021   Restrictive lung disease secondary to obesity 01/28/2022   S/P hysterectomy 02/19/2021   Tinea pedis of both feet 11/12/2018   Unspecified atrial flutter (HCC) 02/12/2022   Upper GI bleed 07/18/2022   Venous insufficiency of both lower extremities 07/02/2021   Weakness 03/20/2021   Weight loss 03/15/2021   Significant Hospital Events: Including procedures, antibiotic start and stop dates in addition to other pertinent events   12/29 - Admitted for acute hypoxemic and hypercarbic respiratory failure and AMS. Intubated. Urine toxicology negative MRSA PCR positive but otherwise cultures are all negative 12/30 - EEG with moderate diffuse encephalopathy, no seizures/epileptiform discharges. PICC placed. 12/31 - No significant overnight events Mental status significantly improved, mildly anxious off of Precedex  but nodding appropriately to questions and awake/alert. Max Levo 2mcg/24H, currently off. Plan for likely extubation to  BiPAP today, 12/31. TF held. Phos/K repleted Diuresis BID titrating up to home doses  Interim History / Subjective:    10/02/2023: Afebrile x 48 hours.  White count normal.  Off Levophed .  Remains extubated.Still on subcutaneous prophylactic dose Lovenox .  No evidence of bleeding.  Remains on empiric Rocephin  through tomorrow.  Off Precedex .  Is on CPAP at home at night but here use BiPAP last night.  She remains on 5 L oxygen  here with a pulse ox 96%.  This is home baseline requirement.  Of note she suffers from chronic neuropathy and is on gabapentin  but she says she is stable right now and will try without being on gabapentin . She is also on Wellbutrin  at home and she is  unable to articulate why she is taking Wellbutrin .  Objective:  Blood pressure 112/62, pulse 77, temperature (!) 97.5 F (36.4 C), temperature source Oral, resp. rate 20, height 5' 5 (1.651 m), weight (!) 152.1 kg, SpO2 98%.    FiO2 (%):  [35 %] 35 % PEEP:  [8 cmH20] 8 cmH20   Intake/Output Summary (Last 24 hours) at 10/02/2023 1114 Last data filed at 10/02/2023 1000 Gross per 24 hour  Intake 961.65 ml  Output 4650 ml  Net -3688.35 ml   Filed Weights   09/30/23 0500 10/01/23 0500 10/02/23 0500  Weight: (!) 157.8 kg (!) 154.6 kg (!) 152.1 kg   Physical Examination:  General Appearance: On 5 L oxygen  morbidly obese lying in bed watching TV Head:  Normocephalic, without obvious abnormality, atraumatic Eyes:  PERRL - yes, conjunctiva/corneas - mudd     Ears:  Normal external ear canals, both ears Nose:  G tube - no but has nasal cannula oxygen  5 L pulse ox 96% Throat:  ETT TUBE - no , OG tube - no Neck:  Supple,  No enlargement/tenderness/nodules Lungs: Clear to auscultation bilaterally, distant breath sounds Heart:  S1 and S2 normal, no murmur, CVP - no.  Pressors - no Abdomen:  Soft, no masses, no organomegaly Genitalia / Rectal:  Not done Extremities:  Extremities- intact Skin:  ntact in exposed areas . Sacral area -no decub but I have not examined Neurologic:  Sedation - none -> RASS -0 . Moves all 4s - yes. CAM-ICU - neg . Orientation - x3+    .  Resolved Hospital Problem List:    Assessment & Plan:  Acute-on-chronic hypoxemic and hypercarbic respiratory failure OSA/OHS COPD with chronic respiratory failure  10/02/2023: Remains on 5 L nasal cannula baseline.  At night she is using BiPAP.  Plan  - Hold off on gabapentin  for neuropathy [sedative effect] -Use 5 L nasal cannula oxygen  and daytime which is her baseline; goal pulse ox greater than 92% - Do BiPAP at night [baseline CPAP] - Check arterial blood gas 10/03/2023 and if hypercapnic will need BiPAP at  discharge -Outpatient sleep PCCM will make referral] doctor appointment [     Undifferentiated shock, presume septic/respiratory source, improving  10/02/23: resolved. CUtlrue negative  - Goal MAP > 65 - Continue ceftriaxone ; last dose 10/03/23  HTN HLD HFpEF Echo 05/2023 EF 60-65%, normal LV/RV systolic function, degenerative MV, moderate AS. - Continue statin - Resume Jardiance  - Diuresis as tolerated, begin gently with Lasix  BID + spironolactone  resumption -.  Switch over to torsemide  10/03/2023   AFlutter (on Eliqjuis) CHA2DS2-VASc Score 4. - Continue amiodarone  - Cardiac monitoring - Optimize electrolytes for K > 4, Mg > 2 - Lovenox  for AC at present, previously on Eliquis  ->  changed to home Eliquis  10/02/2023  T2DM - Basal Semglee  30U QHS - SSI - CBGs Q4H - Goal CBG 140-180   - Monitor I&Os in the setting of diuresis  Hypothyroidism - Continue levothyroxine   GERD - PPI  OA RLS - Continue Requip   Depression Anxiety - PAD protocol as above  Best Practice: (right click and Reselect all SmartList Selections daily)   Diet/type: NPO DVT prophylaxis: LMWH GI prophylaxis: PPI Lines: Central line Foley:  Yes, and it is still needed Code Status:  full code Last date of multidisciplinary goals of care discussion [Pending]   10/02/2023: Patient updated  Disposition: Moved to progressive unit 10/02/2023 and from 10/03/2023 family practice primary and CCM off.  Discussed with Dr. Izetta Nap    ATTESTATION & SIGNATURE       Dr. Dorethia Cave, M.D., [facility]C.P Pulmonary and Critical Care Medicine Staff Physician, [facility] [place] [facility] Pager: [phone], If no answer or between  15:00h - 7:00h: call [phone]  10/02/2023 11:15 AM   LABS    PULMONARY Recent Labs  Lab 09/29/23 1308 09/29/23 1309 09/29/23 1550 09/29/23 2149  HCO3  --  35.5* 38.5* 37.4*  TCO2 34* 38* 41*  --   O2SAT  --  96 99 77.7     CBC Recent Labs  Lab 09/30/23 0744 10/01/23 0413 10/02/23 0411  HGB 11.5* 11.0* 11.1*  HCT 38.6 35.9* 37.3  WBC 11.8* 9.8 8.7  PLT 327 298 296    COAGULATION No results for input(s): INR in the last 168 hours.  CARDIAC  No results for input(s): TROPONINI in the last 168 hours. No results for input(s): PROBNP in the last 168 hours.   CHEMISTRY Recent Labs  Lab 09/29/23 1300 09/29/23 1308 09/29/23 1309 09/29/23 1550 09/30/23 0109 09/30/23 0744 10/01/23 0413 10/02/23 0411  NA 135 136   < > 138 141 141 141 142  K 5.2* 5.1   < > 4.5 4.9 4.7 3.4* 4.0  CL 94* 95*  --   --  99 97* 101 98  CO2 29  --   --   --  29 33* 31 34*  GLUCOSE 433* 437*  --   --  212* 180* 225* 118*  BUN 24* 31*  --   --  24* 23* 16 14  CREATININE 1.18* 1.30*  --   --  0.99 0.89 0.62 0.74  CALCIUM  8.4*  --   --   --  8.6* 8.7* 8.5* 8.5*  MG  --   --   --   --   --  2.5* 2.5* 2.3  PHOS  --   --   --   --   --  2.4* 2.2* 5.0*   < > = values in this interval not displayed.   Estimated Creatinine Clearance: 113.6 mL/min (by C-G formula based on SCr of 0.74 mg/dL).   LIVER Recent Labs  Lab 09/29/23 1300  AST 50*  ALT 24  ALKPHOS 121  BILITOT 0.4  PROT 7.6  ALBUMIN  3.0*     INFECTIOUS Recent Labs  Lab 09/29/23 1310 09/29/23 1458 09/29/23 1550  LATICACIDVEN 3.1*  --  2.5*  PROCALCITON  --  0.17  --      ENDOCRINE CBG (last 3)  Recent Labs    10/01/23 2325 10/02/23 0314 10/02/23 0746  GLUCAP 115* 113* 126*         IMAGING x48h  - image(s) personally visualized  -  highlighted in bold DG CHEST PORT 1 VIEW Result Date: 10/01/2023 CLINICAL DATA:  Endotracheal tube. EXAM: PORTABLE CHEST 1 VIEW COMPARISON:  September 29, 2023. FINDINGS: Stable cardiomegaly. Endotracheal and nasogastric tubes are unchanged in position. Right-sided PICC line is unchanged. Increased right basilar opacity is noted concerning for atelectasis or possibly pneumonia. Minimal left basilar  subsegmental atelectasis is noted. Bony thorax is unremarkable. IMPRESSION: Stable support apparatus. Increased right basilar opacity concerning for atelectasis or possibly pneumonia. Electronically Signed   By: Lynwood Landy Raddle M.D.   On: 10/01/2023 09:55   US  ABDOMEN LIMITED RUQ (LIVER/GB) Result Date: 09/30/2023 CLINICAL DATA:  Cirrhosis EXAM: ULTRASOUND ABDOMEN LIMITED RIGHT UPPER QUADRANT COMPARISON:  None Available. FINDINGS: Gallbladder: Surgically absent Common bile duct: Diameter: 4 mm Liver: Heterogeneous echogenic parenchyma consistent with provided history of chronic liver disease. Portal vein is patent on color Doppler imaging with normal direction of blood flow towards the liver. Other: Trace ascites. IMPRESSION: Heterogeneous echogenic liver. No biliary ductal dilatation.  Previous cholecystectomy. Trace ascites Electronically Signed   By: Ranell Bring M.D.   On: 09/30/2023 14:32

## 2023-10-02 NOTE — Progress Notes (Signed)
    PM rounds via elink camera   Stable Watching her phione in bed She wanted immodioum after 1 large BM - lactulse already on hold  Plan - await progersis bed - check ammonia tomorrow - no immodium unless repeaetd stooling     SIGNATURE    Dr. Dorethia Cave, M.D., F.C.C.P,  Pulmonary and Critical Care Medicine Staff Physician, [facility] Center Director - Interstitial Lung Disease  Program  Pulmonary Fibrosis [facility] [place], [place], [address]   Pager: [phone], If no answer  -> Check AMION or Try [phone] Telephone (clinical office): [phone] Telephone (research): [phone]  4:35 PM 10/02/2023

## 2023-10-02 NOTE — Plan of Care (Signed)
  Problem: Fluid Volume: Goal: Ability to maintain a balanced intake and output will improve Outcome: Progressing   Problem: Health Behavior/Discharge Planning: Goal: Ability to manage health-related needs will improve Outcome: Progressing   Problem: Metabolic: Goal: Ability to maintain appropriate glucose levels will improve Outcome: Progressing   Problem: Nutritional: Goal: Maintenance of adequate nutrition will improve Outcome: Progressing   Problem: Skin Integrity: Goal: Risk for impaired skin integrity will decrease Outcome: Progressing   Problem: Clinical Measurements: Goal: Diagnostic test results will improve Outcome: Progressing Goal: Respiratory complications will improve Outcome: Progressing   Problem: Activity: Goal: Risk for activity intolerance will decrease Outcome: Progressing   Problem: Pain Management: Goal: General experience of comfort will improve Outcome: Progressing   Problem: Safety: Goal: Ability to remain free from injury will improve Outcome: Progressing

## 2023-10-02 NOTE — Telephone Encounter (Signed)
   Chelsea [name] [address] [place] [address]  Plan  - Please give first available hospital follow-up with nurse practitioner specializing in sleep diseases or to one of the sleep docs.  She has morbid obesity and has recurrent hypercapnic respiratory failure and gets intubated.  -Okay to be seen in the next few months

## 2023-10-03 DIAGNOSIS — J9622 Acute and chronic respiratory failure with hypercapnia: Secondary | ICD-10-CM | POA: Diagnosis not present

## 2023-10-03 DIAGNOSIS — R579 Shock, unspecified: Secondary | ICD-10-CM

## 2023-10-03 DIAGNOSIS — J9621 Acute and chronic respiratory failure with hypoxia: Secondary | ICD-10-CM | POA: Diagnosis present

## 2023-10-03 DIAGNOSIS — E1165 Type 2 diabetes mellitus with hyperglycemia: Secondary | ICD-10-CM | POA: Insufficient documentation

## 2023-10-03 LAB — BLOOD GAS, VENOUS
Acid-Base Excess: 9.7 mmol/L — ABNORMAL HIGH (ref 0.0–2.0)
Bicarbonate: 37.6 mmol/L — ABNORMAL HIGH (ref 20.0–28.0)
O2 Saturation: 78.3 %
Patient temperature: 37
pCO2, Ven: 65 mm[Hg] — ABNORMAL HIGH (ref 44–60)
pH, Ven: 7.37 (ref 7.25–7.43)
pO2, Ven: 52 mm[Hg] — ABNORMAL HIGH (ref 32–45)

## 2023-10-03 LAB — CBC
HCT: 36 % (ref 36.0–46.0)
Hemoglobin: 11.1 g/dL — ABNORMAL LOW (ref 12.0–15.0)
MCH: 27.1 pg (ref 26.0–34.0)
MCHC: 30.8 g/dL (ref 30.0–36.0)
MCV: 88 fL (ref 80.0–100.0)
Platelets: 312 10*3/uL (ref 150–400)
RBC: 4.09 MIL/uL (ref 3.87–5.11)
RDW: 15.8 % — ABNORMAL HIGH (ref 11.5–15.5)
WBC: 7.9 10*3/uL (ref 4.0–10.5)
nRBC: 0 % (ref 0.0–0.2)

## 2023-10-03 LAB — BASIC METABOLIC PANEL
Anion gap: 11 (ref 5–15)
BUN: 14 mg/dL (ref 6–20)
CO2: 33 mmol/L — ABNORMAL HIGH (ref 22–32)
Calcium: 8.1 mg/dL — ABNORMAL LOW (ref 8.9–10.3)
Chloride: 95 mmol/L — ABNORMAL LOW (ref 98–111)
Creatinine, Ser: 0.78 mg/dL (ref 0.44–1.00)
GFR, Estimated: >60 mL/min (ref >60)
Glucose, Bld: 170 mg/dL — ABNORMAL HIGH (ref 70–99)
Potassium: 3.4 mmol/L — ABNORMAL LOW (ref 3.5–5.1)
Sodium: 139 mmol/L (ref 135–145)

## 2023-10-03 LAB — GLUCOSE, CAPILLARY
Glucose-Capillary: 162 mg/dL — ABNORMAL HIGH (ref 70–99)
Glucose-Capillary: 168 mg/dL — ABNORMAL HIGH (ref 70–99)
Glucose-Capillary: 150 mg/dL — ABNORMAL HIGH (ref 70–99)
Glucose-Capillary: 218 mg/dL — ABNORMAL HIGH (ref 70–99)
Glucose-Capillary: 254 mg/dL — ABNORMAL HIGH (ref 70–99)
Glucose-Capillary: 213 mg/dL — ABNORMAL HIGH (ref 70–99)

## 2023-10-03 LAB — MAGNESIUM: Magnesium: 1.9 mg/dL (ref 1.7–2.4)

## 2023-10-03 LAB — AMMONIA: Ammonia: 14 umol/L (ref 9–35)

## 2023-10-03 LAB — PHOSPHORUS: Phosphorus: 4.2 mg/dL (ref 2.5–4.6)

## 2023-10-03 MED ORDER — ALTEPLASE 2 MG IJ SOLR
2.0000 mg | Freq: Once | INTRAMUSCULAR | Status: AC
Start: 2023-10-03 — End: 2023-10-03
  Administered 2023-10-03: 2 mg
  Filled 2023-10-03: qty 2

## 2023-10-03 MED ORDER — POTASSIUM CHLORIDE CRYS ER 20 MEQ PO TBCR
40.0000 meq | EXTENDED_RELEASE_TABLET | Freq: Once | ORAL | Status: AC
  Administered 2023-10-03: 40 meq via ORAL
  Filled 2023-10-03: qty 2

## 2023-10-03 MED ORDER — INSULIN GLARGINE-YFGN 100 UNIT/ML ~~LOC~~ SOLN
35.0000 [IU] | Freq: Every day | SUBCUTANEOUS | Status: DC
  Administered 2023-10-03 – 2023-10-05 (×3): 35 [IU] via SUBCUTANEOUS
  Filled 2023-10-03 (×3): qty 0.35

## 2023-10-03 MED ORDER — ALTEPLASE 2 MG IJ SOLR
2.0000 mg | Freq: Once | INTRAMUSCULAR | Status: AC
  Administered 2023-10-03: 2 mg
  Filled 2023-10-03: qty 2

## 2023-10-03 MED ORDER — INSULIN ASPART 100 UNIT/ML IJ SOLN
0.0000 [IU] | Freq: Three times a day (TID) | INTRAMUSCULAR | Status: DC
  Administered 2023-10-03: 7 [IU] via SUBCUTANEOUS
  Administered 2023-10-03: 11 [IU] via SUBCUTANEOUS
  Administered 2023-10-04: 4 [IU] via SUBCUTANEOUS
  Administered 2023-10-04: 7 [IU] via SUBCUTANEOUS
  Administered 2023-10-04: 11 [IU] via SUBCUTANEOUS
  Administered 2023-10-05: 7 [IU] via SUBCUTANEOUS
  Administered 2023-10-05: 3 [IU] via SUBCUTANEOUS
  Administered 2023-10-05: 7 [IU] via SUBCUTANEOUS
  Administered 2023-10-06: 11 [IU] via SUBCUTANEOUS
  Administered 2023-10-06 (×2): 4 [IU] via SUBCUTANEOUS
  Administered 2023-10-07: 7 [IU] via SUBCUTANEOUS
  Administered 2023-10-07: 4 [IU] via SUBCUTANEOUS

## 2023-10-03 MED ORDER — POTASSIUM CHLORIDE 20 MEQ PO PACK
40.0000 meq | PACK | Freq: Once | ORAL | Status: DC
  Filled 2023-10-03: qty 2

## 2023-10-03 MED ORDER — INSULIN ASPART 100 UNIT/ML IJ SOLN
0.0000 [IU] | Freq: Every day | INTRAMUSCULAR | Status: DC
  Administered 2023-10-03 – 2023-10-04 (×2): 2 [IU] via SUBCUTANEOUS
  Administered 2023-10-05: 3 [IU] via SUBCUTANEOUS

## 2023-10-03 NOTE — Assessment & Plan Note (Addendum)
 Sugars 120-250 yesterday, received  28 units SAI yesterday, will increase 35 units today. - Semglee 35 units at bedtime -CBG qAC/HS -SSI

## 2023-10-03 NOTE — Progress Notes (Signed)
   10/03/23 0022  BiPAP/CPAP/SIPAP  $ Non-Invasive Ventilator  Non-Invasive Vent Subsequent  BiPAP/CPAP/SIPAP Pt Type Adult  BiPAP/CPAP/SIPAP SERVO  Mask Type Full face mask  Mask Size Medium  Set Rate 14 breaths/min  Respiratory Rate 21 breaths/min  IPAP 16 cmH20  EPAP 8 cmH2O  PEEP 8 cmH20  FiO2 (%) 35 %  Minute Ventilation 12.2  Leak 31  Peak Inspiratory Pressure (PIP) 16  Tidal Volume (Vt) 676  Patient Home Equipment No  Auto Titrate No  Press High Alarm 35 cmH2O  Press Low Alarm 5 cmH2O

## 2023-10-03 NOTE — Assessment & Plan Note (Signed)
 Pt came in requiring intubation and was sent to ICU, later extubated back to baseline home O2 requirements of 5L [place]. Respiratory failure thought likely 2/2 OSA/OHS/COPD. Plan for ABG this am to determine need for BiPAP currently/at home.  -BiPAP at bedtime -ABG -Outpt sleep study, sched by PCCM -5L  during daytime, O2 sats > 92%

## 2023-10-03 NOTE — Plan of Care (Signed)

## 2023-10-03 NOTE — Progress Notes (Addendum)
 RT went to collect ABG.  Patient refused stating "she does not do blood gases." Patient is willing to do a venous gas.  Patient satting 100% on 5L [place].  Venous gas recommended. MD notified via messenger.

## 2023-10-03 NOTE — Progress Notes (Signed)
 Nutrition Follow-up  DOCUMENTATION CODES:   Morbid obesity  INTERVENTION:   -Continue heart healthy diet, encourage intakes.     NUTRITION DIAGNOSIS:   Inadequate oral intake related to acute illness as evidenced by NPO status.  -addressing with diet advancement  GOAL:   Patient will meet greater than or equal to 90% of their needs  -progressing  MONITOR:   PO intake, Labs, Weight trends, Skin  REASON FOR ASSESSMENT:   Follow up  ASSESSMENT:   Pt admitted with AMS d/t Acute on chronic hypercarbic and hypoxemic resp failure. PMH significant for metabolic syndrome, HF, HTN, hepatitis C, aflutter, chronic hypoxemic and hypercapnic respiratory failure, chronic pain. Recently admitted with CHF, hypercarbia discharged on 12/24.  12/29-Admitted to ICU, respiratory failure, intubated. 12/31 -Extubation to BiPAP, feeding tube held. 01/01-diet advanced to heart healthy  Intakes recorded 100% x 3 meals. Patient states she is tolerating her diet, tolerates all foods, appetite is fine.  Provided diet instruction for discharge.  Current weight is at the same from admit. Edema non-pitting generalized, RLE, LLE.   Medication reviewed and include novolog  SS 3x daily with meals and at bedtime, insulin  glargine-yfgn 35 units at bedtime, lactulose , synthroid , PPI, torsemide .  Labs: CBG 150-254 past 24 hrs, potassium 3.4   Diet Order:   Diet Order             Diet Heart Room service appropriate? Yes; Fluid consistency: Thin  Diet effective now                   EDUCATION NEEDS:   Education needs have been addressed  Skin:  Skin Assessment: Skin Integrity Issues: Skin Integrity Issues:: Other (Comment) Other: erythema/redness bilateral buttock  Last BM:  10/03/2023, type 6-large  Height:   Ht Readings from Last 1 Encounters:  09/29/23 5' 5 (1.651 m)    Weight:   Wt Readings from Last 1 Encounters:  10/03/23 (!) 150.3 kg    Ideal Body Weight:  56.8  kg  BMI:  Body mass index is 55.14 kg/m.  Estimated Nutritional Needs:   Kcal:  1700-1900  Protein:  100-115g  Fluid:  >/=1.9L    Chelsea [name], RDLD Clinical Dietitian If unable to reach, please contact RD Inpatient secure chat group between 8 am-4 pm daily

## 2023-10-03 NOTE — TOC Initial Note (Signed)
 Transition of Care [facility]) - Initial/Assessment Note    Patient Details  Name: Chelsea [name] MRN: [id number] Date of Birth: 07-Apr-1966  Transition of Care [facility]) CM/SW Contact:    Isaiah Public, LCSWA Phone Number: 10/03/2023, 3:17 PM  Clinical Narrative:                  CSW received consult for possible SNF placement at time of discharge. CSW spoke with patient at bedside regarding PT recommendation of SNF placement at time of discharge. Patient reports she comes from [facility] long term SNF. Patient confirmed plan is to return back to [facility] when medically ready.Patient expressed understanding of PT recommendation and is agreeable to receive short term rehab when she returns back to [facility].CSW received consult for patient. All questions answered. Patient plans to follow up with current long term care facility for additional ltc placement options/placement if interested. CSW discussed insurance authorization process.No further questions reported at this time. CSW to continue to follow and assist with discharge planning needs.   Expected Discharge Plan: Skilled Nursing Facility Barriers to Discharge: Continued Medical Work up   Patient Goals and CMS Choice Patient states their goals for this hospitalization and ongoing recovery are:: SNF   Choice offered to / list presented to : Patient      Expected Discharge Plan and Services In-house Referral: Clinical Social Work     Living arrangements for the past 2 months: Skilled Nursing Facility                                      Prior Living Arrangements/Services Living arrangements for the past 2 months: Skilled Nursing Facility Lives with:: Facility Resident Patient language and need for interpreter reviewed:: Yes Do you feel safe going back to the place where you live?: Yes      Need for Family Participation in Patient Care: Yes (Comment) Care giver support system in place?: Yes (comment)   Criminal Activity/Legal Involvement  Pertinent to Current Situation/Hospitalization: No - Comment as needed  Activities of Daily Living   ADL Screening (condition at time of admission) Independently performs ADLs?: No Does the patient have a NEW difficulty with bathing/dressing/toileting/self-feeding that is expected to last >3 days?: No Does the patient have a NEW difficulty with getting in/out of bed, walking, or climbing stairs that is expected to last >3 days?: No Does the patient have a NEW difficulty with communication that is expected to last >3 days?: No Is the patient deaf or have difficulty hearing?: No Does the patient have difficulty seeing, even when wearing glasses/contacts?: No Does the patient have difficulty concentrating, remembering, or making decisions?: No  Permission Sought/Granted Permission sought to share information with : Case Manager, Magazine Features Editor, Family Supports Permission granted to share information with : Yes, Verbal Permission Granted  Share Information with NAME: Dewayne  Permission granted to share info w AGENCY: SNF  Permission granted to share info w Relationship: significant other  Permission granted to share info w Contact Information: [place] [phone]  Emotional Assessment Appearance:: Appears stated age Attitude/Demeanor/Rapport: Gracious Affect (typically observed): Calm Orientation: : Oriented to Self, Oriented to Place, Oriented to  Time, Oriented to Situation Alcohol / Substance Use: Not Applicable Psych Involvement: No (comment)  Admission diagnosis:  Somnolence [R40.0] Acute on chronic respiratory failure with hypercapnia (HCC) [J96.22] Acute metabolic encephalopathy [G93.41] AMS (altered mental status) [R41.82] Patient Active Problem List  Diagnosis Date Noted   Acute on chronic respiratory failure with hypoxia and hypercapnia (HCC) 10/03/2023   Type 2 diabetes mellitus with hyperglycemia (HCC) 10/03/2023   Shock (HCC) 10/03/2023   AMS  (altered mental status) 09/29/2023   Acute metabolic encephalopathy 09/29/2023   Abnormal CT scan of lung 09/22/2023   Urinary retention 09/20/2023   Acute hypoxic on chronic hypercapnic respiratory failure (HCC) 09/19/2023   Elevated serum creatinine 08/20/2023   Yeast infection 08/18/2023   Hypercapnemia 08/17/2023   T2DM (type 2 diabetes mellitus) (HCC) 08/16/2023   Acute hypoxic respiratory failure (HCC) 08/15/2023   Pain of left middle finger 06/22/2023   Intertrigo 06/22/2023   Hypokalemia 06/15/2023   Hyperphosphatemia 06/04/2023   Anxiety 05/30/2023   Atrial flutter with rapid ventricular response (HCC) 05/29/2023   AKI (acute kidney injury) (HCC) 05/29/2023   Arthritis 11/13/2022   Asthma 11/13/2022   Chronic lower back pain 11/13/2022   Complication of anesthesia 11/13/2022   Coronary artery disease 11/13/2022   (HFpEF) heart failure with preserved ejection fraction (HCC) 11/13/2022   Heart murmur 11/13/2022   History of gout 11/13/2022   Hypertension 11/13/2022   Migraine 11/13/2022   On home oxygen  therapy 11/13/2022   Chronic iron  deficiency anemia 09/18/2022   Chronic disease anemia 08/01/2022   Upper GI bleed 07/18/2022   COPD (chronic obstructive pulmonary disease) with chronic bronchitis (HCC) 07/18/2022   Acute blood loss anemia 07/18/2022   Bleeding of the respiratory tract 04/07/2022   Chronic respiratory failure with hypercapnia (HCC) 02/12/2022   Polyneuropathy, unspecified 02/12/2022   Restrictive lung disease secondary to obesity 01/28/2022   Limitation of activity due to disability 09/18/2021   Paroxysmal atrial fibrillation (HCC) 09/04/2021   DJD (degenerative joint disease) 07/03/2021   Pressure injury of both heels, unstageable (HCC) 07/03/2021   Venous insufficiency of both lower extremities 07/02/2021   Physical deconditioning 04/24/2021   Depression 04/20/2021   History of tobacco use 04/20/2021   Hyperglycemia due to type 2 diabetes  mellitus (HCC) 04/20/2021   Irritable bowel syndrome with constipation 04/20/2021   Microalbuminuria 04/20/2021   On supplemental oxygen  by nasal cannula 04/20/2021   Renal insufficiency 04/20/2021   Moderate aortic stenosis 04/20/2021   Mild tricuspid regurgitation 04/02/2021   Mitral valve sclerosis 04/02/2021   Morbid obesity with BMI of 50.0-59.9, adult (HCC) 04/02/2021   Non-smoker 04/02/2021   Awaiting admission to adequate facility elsewhere 03/28/2021   Metabolic alkalosis 03/28/2021   Restless leg syndrome 03/22/2021   Weakness 03/20/2021   Hx of atrial flutter 03/15/2021   Weight loss 03/15/2021   S/P hysterectomy 02/19/2021   Pulmonary nodule less than 6 cm determined by computed tomography of lung 02/09/2021   Dyspnea on exertion 02/09/2021   Nocturnal hypoxemia 06/03/2020   Mixed hyperlipidemia 04/18/2020   Hair loss 09/17/2019   Chronic pain of multiple joints 06/18/2019   Onychomycosis 06/18/2019   Acute idiopathic gout of left hand 02/16/2019   Tinea pedis of both feet 11/12/2018   Hypertension associated with diabetes (HCC) 08/25/2018   Class 3 severe obesity due to excess calories with serious comorbidity and body mass index (BMI) of 60.0 to 69.9 in adult (HCC) 08/25/2018   Physical debility 05/10/2018   Hepatitis C    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   OSA on CPAP 02/24/2018   Acute exacerbation of chronic heart failure (HCC) 09/25/2017   Pneumonia due to gram-positive bacteria 08/24/2017   Acute respiratory failure with hypoxia and hypercarbia (HCC)  05/27/2016   Hyperlipidemia 09/28/2015   COPD (chronic obstructive pulmonary disease) (HCC) 03/04/2015   Bronchitis 05/30/2011   Neuropathic pain, leg 04/09/2011   Mononeuropathy of lower extremity 04/09/2011   Edema 03/22/2011   History of cervical cancer 03/22/2011   Lymphedema 03/22/2011   Obesity hypoventilation syndrome (HCC) 01/01/2011   Acute on chronic diastolic heart  failure (HCC) 97/71/7987   Osteoarthritis 11/28/2010   Hypothyroidism 11/13/2010   Essential hypertension 11/10/2010   GERD 11/10/2010   Gastro-esophageal reflux disease without esophagitis 11/10/2010   Essential (primary) hypertension 11/10/2010   Type 2 diabetes mellitus without complication (HCC) 11/10/2010   Cervical cancer (HCC) 2006   PCP:  Pcp, No Pharmacy:   [facility] [address] [place] [address] Phone: [phone] Fax: [phone]     Social Drivers of Health (SDOH) Social History: SDOH Screenings   Food Insecurity: No Food Insecurity (10/03/2023)  Housing: Low Risk  (10/03/2023)  Transportation Needs: No Transportation Needs (10/03/2023)  Utilities: Not At Risk (10/03/2023)  Alcohol Screen: Low Risk  (07/05/2022)  Financial Resource Strain: Low Risk  (07/05/2022)  Physical Activity: Insufficiently Active (03/27/2021)   Received from [facility], [facility]  Social Connections: Unknown (02/04/2022)   Received from [facility], [facility]  Stress: No Stress Concern Present (09/15/2021)   Received from [facility], [facility]  Tobacco Use: Low Risk  (09/29/2023)   SDOH Interventions:     Readmission Risk Interventions    07/23/2022    2:19 PM 04/09/2022    4:17 PM  Readmission Risk Prevention Plan  Transportation Screening Complete Complete  PCP or Specialist Appt within 3-5 Days  Complete  HRI or Home Care Consult  Complete  Social Work Consult for Recovery Care Planning/Counseling  Complete  Palliative Care Screening  Not Applicable  Medication Review Oceanographer) Complete Referral to Pharmacy  PCP or Specialist appointment within 3-5 days of discharge Complete   HRI or Home Care Consult Complete   SW Recovery Care/Counseling Consult Complete   Palliative Care Screening Not Applicable   Skilled Nursing Facility Complete

## 2023-10-03 NOTE — Evaluation (Signed)
 Physical Therapy Evaluation Patient Details Name: Chelsea [name] MRN: [id number] DOB: 1965-11-24 Today's Date: 10/03/2023  History of Present Illness  Pt is a 58 y/o F presenting to ED from [facility] on 12/29 with AMS and respiratory failure. EEG with moderate diffuse encephalopathy. Recently d/c'd on 12/24 after being admitted for CHF and hypercarbia, multiple admissions for similar throughout 2024. PMH includes metabolic syndrome, heart failure, HTN, a flutter on AC, chronic hypoxemic and hypercapneic respiratory failure, OSA, OHS, COPD, chronic pain   Clinical Impression  Pt presents with condition above and deficits mentioned below, see PT Problem List. PTA, she was working on progressing towards standing with therapies at her SNF, but has been requiring a hoyer for transfers OOB to the w/c. At baseline, pt reports she is typically independent with bed mobility. At this time, she is close to her baseline, requiring minA for bed mobility while utilizing the bed features. She declined attempts at transferring to stand or transferring OOB to the recliner via hoyer lift due to frequent diarrhea this date. She demonstrates deficits in gross strength, balance, and activity tolerance. Noted erythema and edema in her lower legs. Pt reports she has peripheral neuropathy at baseline. Recommending pt return to her SNF and continue to work with skilled PT to work towards her goals. Will continue to follow acutely to prevent deconditioning and to maximize her return to her baseline.     If plan is discharge home, recommend the following: Two people to help with walking and/or transfers;Two people to help with bathing/dressing/bathroom;Assistance with cooking/housework;Assist for transportation;Help with stairs or ramp for entrance   Can travel by private vehicle   No    Equipment Recommendations None recommended by PT  Recommendations for Other Services       Functional Status  Assessment Patient has had a recent decline in their functional status and demonstrates the ability to make significant improvements in function in a reasonable and predictable amount of time. (but close to baseline)     Precautions / Restrictions Precautions Precautions: Fall Precaution Comments: 5LO2 baseline Restrictions Weight Bearing Restrictions Per Provider Order: No      Mobility  Bed Mobility Overal bed mobility: Needs Assistance Bed Mobility: Supine to Sit, Sit to Supine     Supine to sit: Min assist, HOB elevated, Used rails Sit to supine: Min assist, HOB elevated, Used rails   General bed mobility comments: Pt utilizing R bed rail and L HHA on therapist to pull herself up to sit R EOB. MinA needed to lift R leg back onto bed with return to supine. Pt transitioned self superiorly in bed while pulling up on the superior bed rail with bed in trendelenburg    Transfers                   General transfer comment: pt declined attempts this date or hoyer lift OOB to chair due to diarrhea    Ambulation/Gait               General Gait Details: unable  Stairs            Wheelchair Mobility     Tilt Bed    Modified Rankin (Stroke Patients Only)       Balance  Pertinent Vitals/Pain Pain Assessment Pain Assessment: Faces Faces Pain Scale: Hurts little more Pain Location: bil legs/feet Pain Descriptors / Indicators: Discomfort, Grimacing, Guarding Pain Intervention(s): Limited activity within patient's tolerance, Monitored during session    Home Living Family/patient expects to be discharged to:: Skilled nursing facility                   Additional Comments: Has been in and  out of [facility] for the past year    Prior Function Prior Level of Function : Needs assist       Physical Assist : Mobility (physical);ADLs (physical) Mobility (physical): Bed  mobility;Transfers   Mobility Comments: hoyer lift OOB to w/c, but working on LE WB in transfer attempts with therapies at rehab. Pt reports indep with rolling and intermittently with supine <> sit transitions. gets OOB to w/c 2-3x/week ADLs Comments: does most of bathing on her own at bed level, uses AE for LB bathing, reports has set up A for dressing/bathing, uses bedpan for toileting     Extremity/Trunk Assessment   Upper Extremity Assessment Upper Extremity Assessment: Defer to OT evaluation    Lower Extremity Assessment Lower Extremity Assessment: Generalized weakness;RLE deficits/detail;LLE deficits/detail RLE Deficits / Details: noted erythema and edema in lower legs/feet; hx of neuropathy RLE Sensation: history of peripheral neuropathy LLE Deficits / Details: noted erythema and edema in lower legs/feet; hx of neuropathy LLE Sensation: history of peripheral neuropathy    Cervical / Trunk Assessment Cervical / Trunk Assessment: Other exceptions Cervical / Trunk Exceptions: increased body habitus  Communication   Communication Communication: No apparent difficulties  Cognition Arousal: Alert Behavior During Therapy: WFL for tasks assessed/performed, Flat affect Overall Cognitive Status: Within Functional Limits for tasks assessed                                          General Comments General comments (skin integrity, edema, etc.): VSS on 5L O2    Exercises General Exercises - Lower Extremity Ankle Circles/Pumps: AROM, Both, 10 reps, Supine Long Arc Quad: AROM, Strengthening, Both, 10 reps, Seated Heel Slides: AROM, Strengthening, Both, 10 reps, Supine Hip ABduction/ADduction: AROM, Strengthening, Both, 10 reps, Seated Hip Flexion/Marching: AROM, Strengthening, Both, 10 reps, Seated Other Exercises Other Exercises: modified sit ups with pt leaning posteriorly <> anteriorly while sitting EOB Other Exercises: dynamic sitting tasks, > 10 min    Assessment/Plan    PT Assessment Patient needs continued PT services  PT Problem List Decreased activity tolerance;Decreased strength;Decreased range of motion;Decreased balance;Decreased mobility;Cardiopulmonary status limiting activity;Obesity;Impaired sensation       PT Treatment Interventions DME instruction;Gait training;Functional mobility training;Therapeutic activities;Balance training;Therapeutic exercise;Neuromuscular re-education;Patient/family education;Wheelchair mobility training    PT Goals (Current goals can be found in the Care Plan section)  Acute Rehab PT Goals Patient Stated Goal: to progress towards standing PT Goal Formulation: With patient Time For Goal Achievement: 10/17/23 Potential to Achieve Goals: Good    Frequency Min 1X/week     Co-evaluation               AM-PAC PT 6 Clicks Mobility  Outcome Measure Help needed turning from your back to your side while in a flat bed without using bedrails?: A Little Help needed moving from lying on your back to sitting on the side of a flat bed without using bedrails?: A Little Help needed moving to and from a bed  to a chair (including a wheelchair)?: Total Help needed standing up from a chair using your arms (e.g., wheelchair or bedside chair)?: Total Help needed to walk in hospital room?: Total Help needed climbing 3-5 steps with a railing? : Total 6 Click Score: 10    End of Session Equipment Utilized During Treatment: Oxygen  Activity Tolerance: Patient tolerated treatment well Patient left: in bed;with call bell/phone within reach;with bed alarm set   PT Visit Diagnosis: Muscle weakness (generalized) (M62.81);Unsteadiness on feet (R26.81);Difficulty in walking, not elsewhere classified (R26.2)    Time: 8879-8842 PT Time Calculation (min) (ACUTE ONLY): 37 min   Charges:   PT Evaluation $PT Eval Moderate Complexity: 1 Mod PT Treatments $Therapeutic Exercise: 8-22 mins PT General Charges $$  ACUTE PT VISIT: 1 Visit         Theo Ferretti, PT, DPT Acute Rehabilitation Services  Office: [phone]   Theo CHRISTELLA Ferretti 10/03/2023, 1:31 PM

## 2023-10-03 NOTE — Telephone Encounter (Signed)
 LVMTCB to schedule sleep consult.

## 2023-10-03 NOTE — Progress Notes (Signed)
 Daily Progress Note Intern Pager: [phone]  Patient name: Chelsea [name] Medical record number: [id number] Date of birth: [birth year] Age: 58 y.o. Gender: female  Primary Care Provider: Pcp, No Consultants: CCM s/o Code Status: Full Code  Pt Overview and Major Events to Date:  12/29 - Admitted for acute hypoxemic and hypercarbic respiratory failure and AMS. Intubated. Urine toxicology negative MRSA PCR positive but otherwise cultures are all negative 12/30 - EEG with moderate diffuse encephalopathy, no seizures/epileptiform discharges. PICC placed. 12/31 -Extubation to BiPAP. TF held. Phos/K repleted Diuresis BID titrating up to home doses  Assessment and Plan:  Chelsea [name] is a 58 year old woman who presented w/ metabolic encephalopathy, PMHx significant for HTN, HLD, HFpEF (Echo 05/2023 EF 60-65%, normal LV/RV systolic function, degenerative MV, moderate AS), AFlutter (on Eliqjuis), OSA/OHS, COPD with chronic respiratory failure (previously trach-dependent, since decannulated), T2DM, hypothyroidism, GERD, OA, RLS, chronic pain, depression/anxiety.  Assessment & Plan Acute metabolic encephalopathy Thought 2/2 CO2 retention, patient previously intubated in ICU, now extubated and on home O2 requirements. Mentation is appropriate, will continue to hold sedating medications. -Resolved  Acute on chronic respiratory failure with hypoxia and hypercapnia (HCC) Pt came in requiring intubation and was sent to ICU, later extubated back to baseline home O2 requirements of 5L [place]. Respiratory failure thought likely 2/2 OSA/OHS/COPD. Plan for ABG this am to determine need for BiPAP currently/at home.  -BiPAP at bedtime -ABG -Outpt sleep study, sched by PCCM -5L Chelsea [name] during daytime, O2 sats > 92% Type 2 diabetes mellitus with hyperglycemia (HCC) Sugars 120-250 yesterday, received  28 units SAI yesterday, will increase 35 units today. - Semglee  35 units at bedtime -CBG qAC/HS -SSI Shock  (HCC) Undifferentiated shock, thought to be 2/2 respiratory/septic source. Was on pressors now off. BP slightly soft this am, will continue to monitor. Will complete last day of Abx today, blood Cx negative x 3 days.  -Ceftriaxone  to complete today -MAP > 65   Chronic and Stable Issues: A. Flutter: Continue amiodarone  200 mg and eliquis  5 mg, K >4, Mg > 2 HFpEF - restart torsemide  60 mg BID, and hold jardiance  25 mg/spironolactone  25 mg until more stable Hypothyroidism - continue levothyroxine  150 mg Gerd - Continue Protonix  40 mg RLS - continue Requip  0.5 mg HLD - Continue Lipitor 20 mg   FEN/GI: Heart Healthy diet PPx: eliquis  Dispo:SNF pending clinical improvement .    Subjective:  Doing well today, says breathing is improved/baseline. Is curious about what caused her hospitalization.  Objective: Temp:  [97.5 F (36.4 C)-98.4 F (36.9 C)] 98.2 F (36.8 C) (01/02 0420) Pulse Rate:  [68-82] 68 (01/02 0420) Resp:  [13-26] 20 (01/02 0420) BP: (89-127)/(53-93) 97/54 (01/02 0420) SpO2:  [90 %-100 %] 99 % (01/02 0420) FiO2 (%):  [35 %] 35 % (01/02 0244) Weight:  [150.3 kg] 150.3 kg (01/02 0420) Physical Exam: General: NAD, conversant, good mood Cardiovascular: RRR, 2/6 systolic murmur, D8/D7 Respiratory: CTABL, good WOB Abdomen: Soft, NTTP, non-distended Extremities: moving all extremities, no edema  Laboratory: Most recent CBC Lab Results  Component Value Date   WBC 7.9 10/03/2023   HGB 11.1 (L) 10/03/2023   HCT 36.0 10/03/2023   MCV 88.0 10/03/2023   PLT 312 10/03/2023   Most recent BMP    Latest Ref Rng & Units 10/03/2023    5:03 AM  BMP  Glucose 70 - 99 mg/dL 829   BUN 6 - 20 mg/dL 14   Creatinine 9.55 - 1.00 mg/dL 9.21  Sodium 135 - 145 mmol/L 139   Potassium 3.5 - 5.1 mmol/L 3.4   Chloride 98 - 111 mmol/L 95   CO2 22 - 32 mmol/L 33   Calcium  8.9 - 10.3 mg/dL 8.1      Jennelle Riis, MD 10/03/2023, 7:37 AM  PGY-3, [facility] FPTS  Intern pager: [phone], text pages welcome Secure chat group [facility] Teaching Service

## 2023-10-03 NOTE — Assessment & Plan Note (Signed)
 Undifferentiated shock, thought to be 2/2 respiratory/septic source. Was on pressors now off. BP slightly soft this am, will continue to monitor. Will complete last day of Abx today, blood Cx negative x 3 days.  -Ceftriaxone to complete today -MAP > 65

## 2023-10-03 NOTE — Assessment & Plan Note (Signed)
 Thought 2/2 CO2 retention, patient previously intubated in ICU, now extubated and on home O2 requirements. Mentation is appropriate, will continue to hold sedating medications. -Resolved

## 2023-10-04 DIAGNOSIS — G9341 Metabolic encephalopathy: Secondary | ICD-10-CM

## 2023-10-04 DIAGNOSIS — J9622 Acute and chronic respiratory failure with hypercapnia: Secondary | ICD-10-CM | POA: Diagnosis not present

## 2023-10-04 LAB — CULTURE, BLOOD (ROUTINE X 2)
Culture: NO GROWTH
Culture: NO GROWTH

## 2023-10-04 LAB — CBC
HCT: 37.8 % (ref 36.0–46.0)
Hemoglobin: 11.4 g/dL — ABNORMAL LOW (ref 12.0–15.0)
MCH: 26.8 pg (ref 26.0–34.0)
MCHC: 30.2 g/dL (ref 30.0–36.0)
MCV: 88.9 fL (ref 80.0–100.0)
Platelets: 293 10*3/uL (ref 150–400)
RBC: 4.25 MIL/uL (ref 3.87–5.11)
RDW: 15.6 % — ABNORMAL HIGH (ref 11.5–15.5)
WBC: 8.2 10*3/uL (ref 4.0–10.5)
nRBC: 0 % (ref 0.0–0.2)

## 2023-10-04 LAB — BASIC METABOLIC PANEL
Anion gap: 9 (ref 5–15)
BUN: 18 mg/dL (ref 6–20)
CO2: 35 mmol/L — ABNORMAL HIGH (ref 22–32)
Calcium: 8.5 mg/dL — ABNORMAL LOW (ref 8.9–10.3)
Chloride: 92 mmol/L — ABNORMAL LOW (ref 98–111)
Creatinine, Ser: 0.88 mg/dL (ref 0.44–1.00)
GFR, Estimated: >60 mL/min (ref >60)
Glucose, Bld: 162 mg/dL — ABNORMAL HIGH (ref 70–99)
Potassium: 4.3 mmol/L (ref 3.5–5.1)
Sodium: 136 mmol/L (ref 135–145)

## 2023-10-04 LAB — GLUCOSE, CAPILLARY
Glucose-Capillary: 162 mg/dL — ABNORMAL HIGH (ref 70–99)
Glucose-Capillary: 197 mg/dL — ABNORMAL HIGH (ref 70–99)
Glucose-Capillary: 258 mg/dL — ABNORMAL HIGH (ref 70–99)
Glucose-Capillary: 205 mg/dL — ABNORMAL HIGH (ref 70–99)

## 2023-10-04 LAB — PHOSPHORUS: Phosphorus: 4.5 mg/dL (ref 2.5–4.6)

## 2023-10-04 LAB — MAGNESIUM: Magnesium: 1.8 mg/dL (ref 1.7–2.4)

## 2023-10-04 MED ORDER — HYDROXYZINE HCL 10 MG PO TABS
10.0000 mg | ORAL_TABLET | Freq: Once | ORAL | Status: AC | PRN
  Administered 2023-10-04: 10 mg via ORAL
  Filled 2023-10-04: qty 1

## 2023-10-04 MED ORDER — LORATADINE 10 MG PO TABS
10.0000 mg | ORAL_TABLET | Freq: Every day | ORAL | Status: DC
  Administered 2023-10-04 – 2023-10-07 (×4): 10 mg via ORAL
  Filled 2023-10-04 (×4): qty 1

## 2023-10-04 MED ORDER — INSULIN GLARGINE 100 UNIT/ML SOLOSTAR PEN: 35.0000 [IU] | PEN_INJECTOR | Freq: Every morning | SUBCUTANEOUS | Status: DC

## 2023-10-04 MED ORDER — LORATADINE 10 MG PO TABS: 10.0000 mg | ORAL_TABLET | Freq: Every day | ORAL | Status: DC

## 2023-10-04 MED ORDER — TRELEGY ELLIPTA 100-62.5-25 MCG/ACT IN AEPB: 1.0000 | INHALATION_SPRAY | Freq: Every day | RESPIRATORY_TRACT | Status: AC

## 2023-10-04 MED ORDER — CHLORHEXIDINE GLUCONATE CLOTH 2 % EX PADS
6.0000 | MEDICATED_PAD | Freq: Every day | CUTANEOUS | Status: DC
  Administered 2023-10-05 – 2023-10-07 (×3): 6 via TOPICAL

## 2023-10-04 NOTE — Discharge Instructions (Addendum)
 Dear Chelsea [name],  Thank you for letting us  participate in your care. You were hospitalized for altered mental status likely due to your respiratory failure and diagnosed with Acute metabolic encephalopathy.  You were intubated in the ICU you are able to return to your baseline 5 L oxygen .  Your BiPAP settings were modified by respiratory therapist.  You were also treated with antibiotics because you were found to be in shock and required medications to help increase your blood pressure.  POST-HOSPITAL & CARE INSTRUCTIONS Please follow BiPAP settings provided below: IPAP 10/ EPAP 5, FiO2 40%  Please ensure patient is provided transportation to get her sleep study on 2/10 at 11:30 AM with [facility] pulmonology care. Follow-up with your PCP so you can restart some of your other medications that were held during admission. Go to your follow up appointments (listed below)    10/05/23 0133  BiPAP/CPAP/SIPAP  $ Non-Invasive Ventilator  Non-Invasive Vent Subsequent  BiPAP/CPAP/SIPAP Pt Type Adult  BiPAP/CPAP/SIPAP SERVO  Mask Type Full face mask  Mask Size Medium  Set Rate 14 breaths/min  Respiratory Rate 22 breaths/min  IPAP 10 cmH20  EPAP 5 cmH2O  PEEP 5 cmH20  FiO2 (%) 40 %  Minute Ventilation 10.2  Leak 47  Peak Inspiratory Pressure (PIP) 15  Tidal Volume (Vt) 478  Patient Home Equipment No  Auto Titrate No  Press High Alarm 35 cmH2O    DOCTOR'S APPOINTMENT   Future Appointments  Date Time Provider Department Center  11/11/2023 11:30 AM Malachy Comer GAILS, NP [facility] None    Follow-up Information     [facility] Follow up on 11/11/2023.   Specialty: Pulmonology Why: 11:30 AM Contact information: [address] [place]  [address] [phone]                Take care and be well!  Family Medicine Teaching Service Inpatient Team [place]  [facility]  [address]  [place], [place] [address] [phone]

## 2023-10-04 NOTE — NC FL2 (Signed)
 [place]  MEDICAID FL2 LEVEL OF CARE FORM     IDENTIFICATION  Patient Name: Chelsea [name] Birthdate: [birth year] Sex: female Admission Date (Current Location): 09/29/2023  [facility] and [place] Number:  Producer, Television/film/video and Address:  [facility] [place], [name], [place], [place] [address]      Provider Number: [id number]  Attending Physician Name and Address:  Anders Otto DASEN, MD  Relative Name and Phone Number:       Current Level of Care: Hospital Recommended Level of Care: Skilled Nursing Facility Prior Approval Number:    Date Approved/Denied:   PASRR Number: [id number]  Discharge Plan: SNF    Current Diagnoses: Patient Active Problem List   Diagnosis Date Noted   Acute on chronic respiratory failure with hypoxia and hypercapnia (HCC) 10/03/2023   Type 2 diabetes mellitus with hyperglycemia (HCC) 10/03/2023   Shock (HCC) 10/03/2023   Acute on chronic respiratory failure with hypercapnia (HCC) 10/03/2023   AMS (altered mental status) 09/29/2023   Acute metabolic encephalopathy 09/29/2023   Abnormal CT scan of lung 09/22/2023   Urinary retention 09/20/2023   Acute hypoxic on chronic hypercapnic respiratory failure (HCC) 09/19/2023   Elevated serum creatinine 08/20/2023   Yeast infection 08/18/2023   Hypercapnemia 08/17/2023   T2DM (type 2 diabetes mellitus) (HCC) 08/16/2023   Acute hypoxic respiratory failure (HCC) 08/15/2023   Pain of left middle finger 06/22/2023   Intertrigo 06/22/2023   Hypokalemia 06/15/2023   Hyperphosphatemia 06/04/2023   Anxiety 05/30/2023   Atrial flutter with rapid ventricular response (HCC) 05/29/2023   AKI (acute kidney injury) (HCC) 05/29/2023   Arthritis 11/13/2022   Asthma 11/13/2022   Chronic lower back pain 11/13/2022   Complication of anesthesia 11/13/2022   Coronary artery disease 11/13/2022   (HFpEF) heart failure with preserved ejection fraction (HCC) 11/13/2022   Heart murmur  11/13/2022   History of gout 11/13/2022   Hypertension 11/13/2022   Migraine 11/13/2022   On home oxygen  therapy 11/13/2022   Chronic iron  deficiency anemia 09/18/2022   Chronic disease anemia 08/01/2022   Upper GI bleed 07/18/2022   COPD (chronic obstructive pulmonary disease) with chronic bronchitis (HCC) 07/18/2022   Acute blood loss anemia 07/18/2022   Bleeding of the respiratory tract 04/07/2022   Chronic respiratory failure with hypercapnia (HCC) 02/12/2022   Polyneuropathy, unspecified 02/12/2022   Restrictive lung disease secondary to obesity 01/28/2022   Limitation of activity due to disability 09/18/2021   Paroxysmal atrial fibrillation (HCC) 09/04/2021   DJD (degenerative joint disease) 07/03/2021   Pressure injury of both heels, unstageable (HCC) 07/03/2021   Venous insufficiency of both lower extremities 07/02/2021   Physical deconditioning 04/24/2021   Depression 04/20/2021   History of tobacco use 04/20/2021   Hyperglycemia due to type 2 diabetes mellitus (HCC) 04/20/2021   Irritable bowel syndrome with constipation 04/20/2021   Microalbuminuria 04/20/2021   On supplemental oxygen  by nasal cannula 04/20/2021   Renal insufficiency 04/20/2021   Moderate aortic stenosis 04/20/2021   Mild tricuspid regurgitation 04/02/2021   Mitral valve sclerosis 04/02/2021   Morbid obesity with BMI of 50.0-59.9, adult (HCC) 04/02/2021   Non-smoker 04/02/2021   Awaiting admission to adequate facility elsewhere 03/28/2021   Metabolic alkalosis 03/28/2021   Restless leg syndrome 03/22/2021   Weakness 03/20/2021   Hx of atrial flutter 03/15/2021   Weight loss 03/15/2021   S/P hysterectomy 02/19/2021   Pulmonary nodule less than 6 cm determined by computed tomography of lung 02/09/2021   Dyspnea on  exertion 02/09/2021   Nocturnal hypoxemia 06/03/2020   Mixed hyperlipidemia 04/18/2020   Hair loss 09/17/2019   Chronic pain of multiple joints 06/18/2019   Onychomycosis 06/18/2019    Acute idiopathic gout of left hand 02/16/2019   Tinea pedis of both feet 11/12/2018   Hypertension associated with diabetes (HCC) 08/25/2018   Class 3 severe obesity due to excess calories with serious comorbidity and body mass index (BMI) of 60.0 to 69.9 in adult (HCC) 08/25/2018   Physical debility 05/10/2018   Hepatitis C    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   OSA on CPAP 02/24/2018   Acute exacerbation of chronic heart failure (HCC) 09/25/2017   Pneumonia due to gram-positive bacteria 08/24/2017   Acute respiratory failure with hypoxia and hypercarbia (HCC) 05/27/2016   Hyperlipidemia 09/28/2015   COPD (chronic obstructive pulmonary disease) (HCC) 03/04/2015   Bronchitis 05/30/2011   Neuropathic pain, leg 04/09/2011   Mononeuropathy of lower extremity 04/09/2011   Edema 03/22/2011   History of cervical cancer 03/22/2011   Lymphedema 03/22/2011   Obesity hypoventilation syndrome (HCC) 01/01/2011   Acute on chronic diastolic heart failure (HCC) 11/28/2010   Osteoarthritis 11/28/2010   Hypothyroidism 11/13/2010   Essential hypertension 11/10/2010   GERD 11/10/2010   Gastro-esophageal reflux disease without esophagitis 11/10/2010   Essential (primary) hypertension 11/10/2010   Type 2 diabetes mellitus without complication (HCC) 11/10/2010   Cervical cancer (HCC) 2006    Orientation RESPIRATION BLADDER Height & Weight     Self, Time, Situation, Place  O2 (5 liters nasal cannula) Incontinent, External catheter (External Urinary Catheter) Weight: (!) 330 lb 7.5 oz (149.9 kg) Height:  5' 5 (165.1 cm)  BEHAVIORAL SYMPTOMS/MOOD NEUROLOGICAL BOWEL NUTRITION STATUS      Continent Diet (Please see discharge summary)  AMBULATORY STATUS COMMUNICATION OF NEEDS Skin   Extensive Assist Verbally Other (Comment) (Dry,Flaky,Erythema,Buttocks,Leg,back,sacrum,Bil.,Wound/Incision LDAs)                       Personal Care Assistance Level of Assistance  Bathing,  Feeding, Dressing Bathing Assistance: Maximum assistance Feeding assistance: Limited assistance Dressing Assistance: Maximum assistance     Functional Limitations Info  Sight, Hearing, Speech Sight Info: Adequate Hearing Info: Adequate Speech Info: Adequate    SPECIAL CARE FACTORS FREQUENCY  PT (By licensed PT), OT (By licensed OT)     PT Frequency: 5x min weekly OT Frequency: 5x min weekly            Contractures Contractures Info: Not present    Additional Factors Info  Code Status, Allergies, Psychotropic, Insulin  Sliding Scale Code Status Info: FULL Allergies Info: Iodinated Contrast Media,Penicillins,Insulin  Lispro,Minoxidil Psychotropic Info: buPROPion  ER (WELLBUTRIN  SR) 12 hr tablet 100 mg 2 times daily Insulin  Sliding Scale Info: insulin  aspart (novoLOG ) injection 0-20 Units 3 times daily with meals,insulin  aspart (novoLOG ) injection 0-5 Units daily at bedtime,insulin  glargine-yfgn (SEMGLEE ) injection 35 Units daily at bedtime       Current Medications (10/04/2023):  This is the current hospital active medication list Current Facility-Administered Medications  Medication Dose Route Frequency Provider Last Rate Last Admin   acetaminophen  (TYLENOL ) 160 MG/5ML solution 650 mg  650 mg Oral Q6H PRN Ilah Krabbe M, PA-C       ALPRAZolam  (XANAX ) tablet 0.5 mg  0.5 mg Oral BID PRN Ilah Krabbe M, PA-C   0.5 mg at 10/04/23 0135   amiodarone  (PACERONE ) tablet 200 mg  200 mg Oral Daily Ilah Krabbe M, PA-C   200  mg at 10/04/23 9192   apixaban  (ELIQUIS ) tablet 5 mg  5 mg Oral BID Ramaswamy, Murali, MD   5 mg at 10/04/23 9193   atorvastatin  (LIPITOR) tablet 20 mg  20 mg Oral QPM Ilah Krabbe M, PA-C   20 mg at 10/03/23 1729   budesonide  (PULMICORT ) nebulizer solution 0.5 mg  0.5 mg Inhalation Q12H Claudene Toribio BROCKS, MD   0.5 mg at 10/04/23 9168   buPROPion  ER (WELLBUTRIN  SR) 12 hr tablet 100 mg  100 mg Oral BID Ramaswamy, Murali, MD   100 mg at 10/04/23 0807   [START  ON 10/05/2023] Chlorhexidine  Gluconate Cloth 2 % PADS 6 each  6 each Topical Q0600 Anders Otto DASEN, MD       docusate sodium  (COLACE) capsule 100 mg  100 mg Oral BID PRN Ilah Krabbe M, PA-C       insulin  aspart (novoLOG ) injection 0-20 Units  0-20 Units Subcutaneous TID WC Miller, Samantha, DO   4 Units at 10/04/23 0805   insulin  aspart (novoLOG ) injection 0-5 Units  0-5 Units Subcutaneous QHS Miller, Samantha, DO   2 Units at 10/03/23 2132   insulin  glargine-yfgn (SEMGLEE ) injection 35 Units  35 Units Subcutaneous QHS Miller, Samantha, DO   35 Units at 10/03/23 2125   ipratropium-albuterol  (DUONEB) 0.5-2.5 (3) MG/3ML nebulizer solution 3 mL  3 mL Nebulization Q6H PRN Geronimo Amel, MD       lactulose  (CHRONULAC ) 10 GM/15ML solution 10 g  10 g Oral BID Ilah Krabbe M, PA-C       levothyroxine  (SYNTHROID ) tablet 150 mcg  150 mcg Oral Q0600 Ilah Krabbe HERO, PA-C   150 mcg at 10/04/23 9381   loratadine  (CLARITIN ) tablet 10 mg  10 mg Oral Daily Diona Perkins, MD       menthol -cetylpyridinium (CEPACOL) lozenge 3 mg  1 lozenge Oral PRN Ramaswamy, Murali, MD   3 mg at 10/02/23 1822   montelukast  (SINGULAIR ) tablet 10 mg  10 mg Oral QHS Ilah Krabbe M, PA-C   10 mg at 10/03/23 2124   oxyCODONE -acetaminophen  (PERCOCET/ROXICET) 5-325 MG per tablet 1 tablet  1 tablet Oral Q6H PRN Mannam, Praveen, MD   1 tablet at 10/04/23 0805   And   oxyCODONE  (Oxy IR/ROXICODONE ) immediate release tablet 5 mg  5 mg Oral Q6H PRN Mannam, Praveen, MD   5 mg at 10/04/23 0805   pantoprazole  (PROTONIX ) EC tablet 40 mg  40 mg Oral QHS Mannam, Praveen, MD   40 mg at 10/03/23 2124   polyethylene glycol (MIRALAX  / GLYCOLAX ) packet 17 g  17 g Oral Daily PRN Ilah Krabbe M, PA-C       rOPINIRole  (REQUIP ) tablet 0.5 mg  0.5 mg Oral QHS Ilah Krabbe M, PA-C   0.5 mg at 10/03/23 2124   sodium chloride  flush (NS) 0.9 % injection 10-40 mL  10-40 mL Intracatheter Q12H Claudene Toribio BROCKS, MD   10 mL at 10/04/23 9192    sodium chloride  flush (NS) 0.9 % injection 10-40 mL  10-40 mL Intracatheter PRN Claudene Toribio BROCKS, MD       torsemide  (DEMADEX ) tablet 60 mg  60 mg Oral BID Ramaswamy, Murali, MD   60 mg at 10/04/23 9193     Discharge Medications: Please see discharge summary for a list of discharge medications.  Relevant Imaging Results:  Relevant Lab Results:   Additional Information [id number]  Isaiah Public, LCSWA

## 2023-10-04 NOTE — Plan of Care (Addendum)
 Spoke with significant other Christopher Qua and confirmed DOB. Discussed pulmonology recommendations and BiPAP settings. He is aware and amenable to discharge.   Addendum: Spoke with him again at 4:36 and let him know patient does not feel comfortable discharging yet. Will observe overnight.

## 2023-10-04 NOTE — Assessment & Plan Note (Addendum)
 Pt came in requiring intubation and was sent to ICU, later extubated back to baseline home O2 requirements of 5L [place]. Respiratory failure thought likely 2/2 OSA/OHS/COPD. Plan for ABG this am to determine need for BiPAP currently/at home.  - 5L [place] during daytime, O2 sats > 92% - BiPAP at bedtime - Outpt sleep study, sched by PCCM on 2/10 at 11:30 AM

## 2023-10-04 NOTE — Assessment & Plan Note (Addendum)
 Undifferentiated shock, thought to be 2/2 respiratory/septic source. Was on pressors now off. BP slightly soft this am, will continue to monitor.  Completed ceftriaxone yesterday, blood culture negative for 5 days. - MAP > 65

## 2023-10-04 NOTE — Telephone Encounter (Signed)
 LM for PT to call for Sleep consult. Closing encounter.

## 2023-10-04 NOTE — Assessment & Plan Note (Addendum)
 Thought 2/2 CO2 retention, patient previously intubated in ICU, now extubated and on home O2 requirements. Mentation is appropriate, will continue to hold sedating medications. -Resolved

## 2023-10-04 NOTE — Progress Notes (Signed)
 Pt stated she did not want to go on BiPAP at this time. Said she would call when ready. Servo on standby in room. RN aware.

## 2023-10-04 NOTE — Progress Notes (Addendum)
 Daily Progress Note Intern Pager: [phone]  Patient name: Chelsea [name] Medical record number: [id number] Date of birth: [birth year] Age: 58 y.o. Gender: female  Primary Care Provider: Pcp, No Consultants: CCM Code Status: Full code  Pt Overview and Major Events to Date:  12/29 - Admitted for acute hypoxemic and hypercarbic respiratory failure and AMS. Intubated. Urine toxicology negative MRSA PCR positive but otherwise cultures are all negative 12/30 - EEG with moderate diffuse encephalopathy, no seizures/epileptiform discharges. PICC placed. 12/31 - Extubation to BiPAP. TF held. Phos/K repleted Diuresis BID titrating up to home doses   Assessment and Plan: Lamanda Jehle is a 58 year old female with past medical history of HTN, HLD, HFpEF (echo 05/2023 EF 60 to 65%, normal LV/RV systolic function, degenerative MV, moderate AAS), a flutter (on Eliquis ), OSA/OHS, COPD with chronic respiratory failure (previously trach dependent, since decannulated), T2DM, hypothyroidism, GERD, OA, RLS, chronic pain, depression/anxiety.   Patient was admitted to due to acute metabolic encephalopathy likely secondary to CO2 retention.  Patient was admitted to the ICU and was intubated, now extubated on home 5 L O2 requirement with improved mentation. Assessment & Plan Acute metabolic encephalopathy Thought 2/2 CO2 retention, patient previously intubated in ICU, now extubated and on home O2 requirements. Mentation is appropriate, will continue to hold sedating medications. - Resolved Acute on chronic respiratory failure with hypoxia and hypercapnia (HCC) Pt came in requiring intubation and was sent to ICU, later extubated back to baseline home O2 requirements of 5L [place]. Respiratory failure thought likely 2/2 OSA/OHS/COPD. Plan for ABG this am to determine need for BiPAP currently/at home.  - 5L [place] during daytime, O2 sats > 92% - BiPAP at bedtime - Outpt sleep study, sched by PCCM on 2/10 at  11:30 AM Type 2 diabetes mellitus with hyperglycemia (HCC) Sugars 162-213 yesterday.  Increased Semglee  to 35 units yesterday.  And has only required 4 SAI since. - Semglee  35 units at bedtime - CBG qAC/HS - SSI Shock (HCC) Undifferentiated shock, thought to be 2/2 respiratory/septic source. Was on pressors now off. BP slightly soft this am, will continue to monitor.  Completed ceftriaxone  yesterday, blood culture negative for 5 days. - MAP > 65  Chronic and Stable Problems:  A. Flutter: Continue amiodarone  200 mg and eliquis  5 mg, K >4, Mg > 2 HFpEF - Restart torsemide  60 mg BID, and hold jardiance  25 mg/spironolactone  25 mg until more stable Hypothyroidism - Continue levothyroxine  150 mg Gerd - Continue Protonix  40 mg RLS - Continue Requip  0.5 mg HLD - Continue Lipitor 20 mg  FEN/GI: Heart healthy diet PPx: Eliquis  Dispo: Home  Subjective:  Patient is doing better today.  Mentation has improved.  She is breathing at her baseline on 5 L.  Her major concern today is some anxiety that she endorses.  Objective: Temp:  [97.9 F (36.6 C)-98.7 F (37.1 C)] 97.9 F (36.6 C) (01/03 0733) Pulse Rate:  [60-71] 60 (01/03 0512) Resp:  [18-24] 18 (01/03 0512) BP: (101-112)/(66-78) 108/78 (01/03 0733) SpO2:  [91 %-99 %] 91 % (01/03 0512) FiO2 (%):  [40 %] 40 % (01/03 0149) Weight:  [149.9 kg] 149.9 kg (01/03 0512)  Physical Exam: General: NAD, conversant Cardiovascular: RRR, no M/R/G. Respiratory: CTAB.  Normal work of breathing on 5 L. Abdomen: Soft, nontender, nondistended.  Normoactive bowel sounds. Extremities: Discoloration due to chronic venous stasis.  Cold RLE versus LLE.  No BLE edema. Neuro: AAOx3. No focal neurological deficits.   Laboratory: Most recent CBC Lab  Results  Component Value Date   WBC 8.2 10/04/2023   HGB 11.4 (L) 10/04/2023   HCT 37.8 10/04/2023   MCV 88.9 10/04/2023   PLT 293 10/04/2023   Most recent BMP    Latest Ref Rng & Units 10/04/2023    5:00  AM  BMP  Glucose 70 - 99 mg/dL 837   BUN 6 - 20 mg/dL 18   Creatinine 9.55 - 1.00 mg/dL 9.11   Sodium 864 - 854 mmol/L 136   Potassium 3.5 - 5.1 mmol/L 4.3   Chloride 98 - 111 mmol/L 92   CO2 22 - 32 mmol/L 35   Calcium  8.9 - 10.3 mg/dL 8.5    Imaging/Diagnostic Tests: No new imaging.  Janna Ferrier, DO 10/04/2023, 8:09 AM  PGY-1, [facility] Family Medicine FPTS Intern pager: [phone], text pages welcome Secure chat group [facility] Teaching Service

## 2023-10-04 NOTE — Plan of Care (Signed)
  Problem: Fluid Volume: Goal: Ability to maintain a balanced intake and output will improve Outcome: Progressing   Problem: Metabolic: Goal: Ability to maintain appropriate glucose levels will improve Outcome: Progressing   Problem: Nutritional: Goal: Maintenance of adequate nutrition will improve Outcome: Progressing Goal: Progress toward achieving an optimal weight will improve Outcome: Progressing   

## 2023-10-04 NOTE — Progress Notes (Signed)
   10/04/23 0149  BiPAP/CPAP/SIPAP  $ Face Mask Large  Yes  BiPAP/CPAP/SIPAP Pt Type Adult  BiPAP/CPAP/SIPAP SERVO  Mask Type Full face mask  Mask Size Medium  Respiratory Rate 24 breaths/min  IPAP 10 cmH20  EPAP 5 cmH2O  PEEP 5 cmH20  FiO2 (%) 40 %  Minute Ventilation 9.8  Leak 41  Peak Inspiratory Pressure (PIP) 16  Tidal Volume (Vt) 453  Patient Home Equipment No  Auto Titrate No  BiPAP/CPAP /SiPAP Vitals  Pulse Rate 66  Resp (!) 22  SpO2 99 %  MEWS Score/Color  MEWS Score 1  MEWS Score Color [place]

## 2023-10-04 NOTE — Plan of Care (Signed)

## 2023-10-04 NOTE — Assessment & Plan Note (Addendum)
 Sugars 162-213 yesterday.  Increased Semglee to 35 units yesterday.  And has only required 4 SAI since. - Semglee 35 units at bedtime - CBG qAC/HS - SSI

## 2023-10-04 NOTE — TOC Progression Note (Addendum)
 Transition of Care [facility]) - Progression Note    Patient Details  Name: DESTINY HAGIN MRN: [id number] Date of Birth: 12/07/1965  Transition of Care [facility]) CM/SW Contact  Isaiah Public, LCSWA Phone Number: 10/04/2023, 12:48 PM  Clinical Narrative:     CSW spoke with Kia with [facility] to verify facility has correct bipap settings for patient. Patient has Bipap at facility. Kia  confirmed they have correct settings and verified settings in respiratory's note.  Patient is from [facility] long term. Plan for patient to return when medically ready.  Update- MD informed CSW patient medically stable for dc. CSW awaiting call back from Kia with [facility] to confirm patient can dc over to facility today.  Update- Kia with [facility] confirmed patient can return back to facility today. CSW informed MD.  Update- Kia with [facility] said facility reviewing dc summary and are concerned about patient returning back today. CSW informed MD. MD informed CSW that dc order has been cancelled. CSW informed Kia with [facility].  Expected Discharge Plan: Skilled Nursing Facility Barriers to Discharge: Continued Medical Work up  Expected Discharge Plan and Services In-house Referral: Clinical Social Work     Living arrangements for the past 2 months: Skilled Nursing Facility                                       Social Determinants of Health (SDOH) Interventions SDOH Screenings   Food Insecurity: No Food Insecurity (10/03/2023)  Housing: Low Risk  (10/03/2023)  Transportation Needs: No Transportation Needs (10/03/2023)  Utilities: Not At Risk (10/03/2023)  Alcohol Screen: Low Risk  (07/05/2022)  Financial Resource Strain: Low Risk  (07/05/2022)  Physical Activity: Insufficiently Active (03/27/2021)   Received from [facility]  Social Connections: Unknown (02/04/2022)   Received from [facility], [facility]  Stress: No Stress Concern Present (09/15/2021)   Received from [facility], [facility]  Tobacco  Use: Low Risk  (09/29/2023)    Readmission Risk Interventions    07/23/2022    2:19 PM 04/09/2022    4:17 PM  Readmission Risk Prevention Plan  Transportation Screening Complete Complete  PCP or Specialist Appt within 3-5 Days  Complete  HRI or Home Care Consult  Complete  Social Work Consult for Recovery Care Planning/Counseling  Complete  Palliative Care Screening  Not Applicable  Medication Review [employer]) Complete Referral to Pharmacy  PCP or Specialist appointment within 3-5 days of discharge Complete   HRI or Home Care Consult Complete   SW Recovery Care/Counseling Consult Complete   Palliative Care Screening Not Applicable   Skilled Nursing Facility Complete

## 2023-10-04 NOTE — Discharge Summary (Deleted)
 Family Medicine Teaching [facility] Discharge Summary  Patient name: Chelsea [name] Medical record number: [id number] Date of birth: [birth year] Age: 58 y.o. Gender: female Date of Admission: 09/29/2023  Date of Discharge: 10/04/23  Admitting Physician: Toribio JAYSON Sharps, MD  Primary Care Provider: Pcp, No Consultants: CCM  Indication for Hospitalization: Acute metabolic encephalopathy secondary to acute on chronic respiratory failure with hypoxia and hypercapnia  Brief Hospital Course:  Chelsea [name] is a 59 y.o.female with a history of HTN, HLD, HFpEF, Aflutter, OSA/OHS, COPD w/ Chronic respiratory failure who was admitted to the ICU, and then to [facility] Medicine Teaching Service at [facility] for metabolic encephalopathy, acute on chronic respiratory failure, and undifferentiated shock. Her hospital course is detailed below:  Acute Metabolic Encephalopathy Likely secondary to CO2 narcosis. Resolved following extubation. Patient was at baseline mental status on discharge from the ICU.  Acute on Chronic Respiratory Failure with hypoxia and hypercapnia Patient was initially intubated on admission in the ICU. She was placed on BiPAP following extubation. On transfer out of the ICU she was tolerating home O2 requirement during the day, and BiPAP at night. A VBG was ordered which showed ongoing mild hypercapnia and hypercarbia. PCCM recommended daytime BiPAP with naps for her outpatient. She was stable on her home 5 L of oxygen  at discharge.  Started her on Trelegy upon discharge instead of Pulmicort  to help manage her symptoms better.  Undifferentiated Shock Patient had low blood pressures in the ICU requiring pressor support. Urine and blood cultures were negative for infectious source. Patient was treated with 4 days of ceftriaxone , 1g q24h. She was weaned off pressors before coming to the floor.  Other chronic conditions were medically managed with home medications and formulary  alternatives as necessary: Neuropathy: Held gabapentin  inpatient due to sedative effect HFpEF: Diuresed with Lasix  BID. Switched to Torsemide .  Held spironolactone  while inpatient. Anxiety: Continued Xanax  0.5 mg, Wellbutrin  100 mg twice daily, and started on Claritin  due to anxiety and itching she endorsed. T2DM: Increased her insulin  to 35 units long-acting daily.  Held Trulicity and Jardiance  while inpatient. Hypothyroidism: Continued Synthroid  during admission  PCP Follow-up Recommendations: Consider restarting Spirolactone once BP stabilizes. Ensure she gets her sleep study with Pulmonology on 2/10 at 11:30 AM  Discharge Diagnoses/Problem List:  Principal Problem:   Acute metabolic encephalopathy Active Problems:   AMS (altered mental status)   Acute on chronic respiratory failure with hypoxia and hypercapnia (HCC)   Type 2 diabetes mellitus with hyperglycemia (HCC)   Shock (HCC)   Acute on chronic respiratory failure with hypercapnia (HCC)  BIPAP Settings: $ Face Mask Large  Yes  BiPAP/CPAP/SIPAP Pt Type Adult  BiPAP/CPAP/SIPAP SERVO  Mask Type Full face mask  Mask Size Medium  Respiratory Rate 24 breaths/min  IPAP 10 cmH20  EPAP 5 cmH2O  PEEP 5 cmH20  FiO2 (%) 40 %  Minute Ventilation 9.8  Leak 41  Peak Inspiratory Pressure (PIP) 16  Tidal Volume (Vt) 453     Disposition: SNF  Discharge Condition: Stable  Discharge Exam: General: NAD, conversant Cardiovascular: RRR, no M/R/G. Respiratory: CTAB.  Normal work of breathing on 5 L. Abdomen: Soft, nontender, nondistended.  Normoactive bowel sounds. Extremities: Discoloration due to chronic venous stasis.  Cold RLE versus LLE.  No BLE edema. Neuro: AAOx3. No focal neurological deficits.   Significant Procedures: Intubation  Significant Labs and Imaging:  Recent Labs  Lab 10/03/23 0503 10/04/23 0500  WBC 7.9 8.2  HGB 11.1* 11.4*  HCT 36.0  37.8  PLT 312 293   Recent Labs  Lab 10/03/23 0503  10/04/23 0500  NA 139 136  K 3.4* 4.3  CL 95* 92*  CO2 33* 35*  GLUCOSE 170* 162*  BUN 14 18  CREATININE 0.78 0.88  CALCIUM  8.1* 8.5*  MG 1.9 1.8  PHOS 4.2 4.5    CXR: Stable support apparatus. Increased right basilar opacity concerning for atelectasis or possibly pneumonia.  CT chest without contrast: 1. Bilateral geographic ground-glass and consolidative airspace opacities greatest in the lower lobes. Findings are concerning for multifocal pneumonia. Pulmonary edema could appear similarly. Follow-up is recommended to ensure resolution. 2. Small left pleural effusion. 3. Enlarged main pulmonary artery compatible with pulmonary arterial hypertension.  CT head without contrast: 1. No acute intracranial abnormality. 2. Moderate opacification of the left sphenoid sinus with increased central attenuation, possibly reflecting inspissated secretions or blood products.  RUQ US : Heterogeneous echogenic liver.  No biliary ductal dilatation.  Previous cholecystectomy.  Trace ascites  Results/Tests Pending at Time of Discharge: None  Discharge Medications:  Allergies as of 10/04/2023       Reactions   Iodinated Contrast Media Anaphylaxis   Penicillins Anaphylaxis   Patient tolerates cefepime  (08/2017)   Insulin  Lispro Other (See Comments)   Adder per MAR from SNF   Minoxidil Hives        Medication List     STOP taking these medications    budesonide  0.5 MG/2ML nebulizer solution Commonly known as: PULMICORT    gabapentin  400 MG capsule Commonly known as: NEURONTIN    melatonin 3 MG Tabs tablet   oxybutynin 5 MG tablet Commonly known as: DITROPAN   spironolactone  25 MG tablet Commonly known as: ALDACTONE        TAKE these medications    albuterol  (2.5 MG/3ML) 0.083% nebulizer solution Commonly known as: PROVENTIL  Take 3 mLs (2.5 mg total) by nebulization every 6 (six) hours as needed for wheezing or shortness of breath (I50.32).   ALPRAZolam  0.5 MG  tablet Commonly known as: XANAX  Take 0.5 mg by mouth 2 (two) times daily as needed for anxiety.   amiodarone  200 MG tablet Commonly known as: PACERONE  Take 1 tablet (200 mg total) by mouth daily.   ammonium lactate  12 % lotion Commonly known as: LAC-HYDRIN  Apply 1 Application topically at bedtime.   atorvastatin  20 MG tablet Commonly known as: LIPITOR Take 20 mg by mouth every evening.   buPROPion  ER 100 MG 12 hr tablet Commonly known as: WELLBUTRIN  SR Take 1 tablet (100 mg total) by mouth 2 (two) times daily.   Cholecalciferol  125 MCG (5000 UT) Tabs Take 1 tablet (5,000 Units total) by mouth daily.   Eliquis  5 MG Tabs tablet Generic drug: apixaban  Take 5 mg by mouth 2 (two) times daily.   empagliflozin  25 MG Tabs tablet Commonly known as: JARDIANCE  Take 1 tablet (25 mg total) by mouth daily.   ferrous sulfate  325 (65 FE) MG EC tablet Take 325 mg by mouth daily.   insulin  glargine 100 UNIT/ML Solostar Pen Commonly known as: LANTUS  Inject 35 Units into the skin in the morning. What changed: how much to take   levothyroxine  150 MCG tablet Commonly known as: SYNTHROID  Take 1 tablet (150 mcg total) by mouth daily at 6 (six) AM. What changed: when to take this   loperamide  2 MG capsule Commonly known as: IMODIUM  Take 2 mg by mouth every 4 (four) hours as needed for diarrhea or loose stools.   loratadine  10 MG tablet Commonly known  as: CLARITIN  Take 1 tablet (10 mg total) by mouth daily.   montelukast  10 MG tablet Commonly known as: SINGULAIR  Take 10 mg by mouth at bedtime.   multivitamin with minerals Tabs tablet Take 1 tablet by mouth daily.   NON FORMULARY Apply BIPAP every night at bedtime.   nystatin  cream Commonly known as: MYCOSTATIN  Apply topically 2 (two) times daily.   omeprazole  20 MG capsule Commonly known as: PRILOSEC Take 20 mg by mouth daily.   oxyCODONE -acetaminophen  10-325 MG tablet Commonly known as: PERCOCET Take 1 tablet by mouth  every 6 (six) hours.   OXYGEN  Inhale 5 L/min into the lungs in the morning and at bedtime. Connect to BIPAP qhs   potassium chloride  SA 20 MEQ tablet Commonly known as: KLOR-CON  M Take 2 tablets (40 mEq total) by mouth daily.   rOPINIRole  0.5 MG tablet Commonly known as: REQUIP  Take 1 tablet (0.5 mg total) by mouth at bedtime.   torsemide  20 MG tablet Commonly known as: DEMADEX  Take 60 mg by mouth 2 (two) times daily.   Trelegy Ellipta  100-62.5-25 MCG/ACT Aepb Generic drug: Fluticasone -Umeclidin-Vilant Inhale 1 puff into the lungs daily.   Trulicity 0.75 MG/0.5ML Soaj Generic drug: Dulaglutide Inject 0.75 mg into the skin every Saturday.        Discharge Instructions: Please refer to Patient Instructions section of EMR for full details.  Patient was counseled important signs and symptoms that should prompt return to medical care, changes in medications, dietary instructions, activity restrictions, and follow up appointments.   Follow-Up Appointments:  Follow-up Information     [facility] Follow up on 11/11/2023.   Specialty: Pulmonology Why: 11:30 AM Contact information: [address] [place]  [phone]                Janna Ferrier, DO 10/04/2023, 1:05 PM PGY-1, [facility]  Upper Level Addendum:  I have seen and evaluated this patient along with Dr. Janna and reviewed the above note, making necessary revisions as appropriate.  I agree with the medical decision making and physical exam as noted above.  Wendel Lesch, MD PGY-3 [facility]

## 2023-10-05 ENCOUNTER — Encounter (HOSPITAL_COMMUNITY): Payer: Self-pay | Admitting: Internal Medicine

## 2023-10-05 DIAGNOSIS — J9621 Acute and chronic respiratory failure with hypoxia: Secondary | ICD-10-CM | POA: Diagnosis not present

## 2023-10-05 DIAGNOSIS — J9622 Acute and chronic respiratory failure with hypercapnia: Secondary | ICD-10-CM | POA: Diagnosis not present

## 2023-10-05 LAB — GLUCOSE, CAPILLARY
Glucose-Capillary: 209 mg/dL — ABNORMAL HIGH (ref 70–99)
Glucose-Capillary: 128 mg/dL — ABNORMAL HIGH (ref 70–99)
Glucose-Capillary: 226 mg/dL — ABNORMAL HIGH (ref 70–99)
Glucose-Capillary: 239 mg/dL — ABNORMAL HIGH (ref 70–99)
Glucose-Capillary: 264 mg/dL — ABNORMAL HIGH (ref 70–99)

## 2023-10-05 MED ORDER — GERHARDT'S BUTT CREAM
TOPICAL_CREAM | Freq: Every day | CUTANEOUS | Status: DC | PRN
  Filled 2023-10-05: qty 60

## 2023-10-05 NOTE — Plan of Care (Signed)
  Problem: Education: Goal: Ability to describe self-care measures that may prevent or decrease complications (Diabetes Survival Skills Education) will improve Outcome: Progressing Goal: Individualized Educational Video(s) Outcome: Progressing   Problem: Coping: Goal: Ability to adjust to condition or change in health will improve Outcome: Progressing   Problem: Fluid Volume: Goal: Ability to maintain a balanced intake and output will improve Outcome: Progressing   Problem: Health Behavior/Discharge Planning: Goal: Ability to identify and utilize available resources and services will improve Outcome: Progressing Goal: Ability to manage health-related needs will improve Outcome: Progressing   Problem: Metabolic: Goal: Ability to maintain appropriate glucose levels will improve Outcome: Progressing   Problem: Nutritional: Goal: Maintenance of adequate nutrition will improve Outcome: Progressing Goal: Progress toward achieving an optimal weight will improve Outcome: Progressing   Problem: Skin Integrity: Goal: Risk for impaired skin integrity will decrease Outcome: Progressing   Problem: Tissue Perfusion: Goal: Adequacy of tissue perfusion will improve Outcome: Progressing   Problem: Education: Goal: Knowledge of General Education information will improve Description: Including pain rating scale, medication(s)/side effects and non-pharmacologic comfort measures Outcome: Progressing   Problem: Health Behavior/Discharge Planning: Goal: Ability to manage health-related needs will improve Outcome: Progressing   Problem: Clinical Measurements: Goal: Ability to maintain clinical measurements within normal limits will improve Outcome: Progressing Goal: Will remain free from infection Outcome: Progressing Goal: Diagnostic test results will improve Outcome: Progressing Goal: Respiratory complications will improve Outcome: Progressing Goal: Cardiovascular complication will  be avoided Outcome: Progressing   Problem: Activity: Goal: Risk for activity intolerance will decrease Outcome: Progressing   Problem: Coping: Goal: Level of anxiety will decrease Outcome: Progressing   Problem: Pain Management: Goal: General experience of comfort will improve Outcome: Progressing   Problem: Safety: Goal: Ability to remain free from injury will improve Outcome: Progressing   Problem: Skin Integrity: Goal: Risk for impaired skin integrity will decrease Outcome: Progressing

## 2023-10-05 NOTE — Progress Notes (Signed)
   10/05/23 0133  BiPAP/CPAP/SIPAP  $ Non-Invasive Ventilator  Non-Invasive Vent Subsequent  BiPAP/CPAP/SIPAP Pt Type Adult  BiPAP/CPAP/SIPAP SERVO  Mask Type Full face mask  Mask Size Medium  Set Rate 14 breaths/min  Respiratory Rate 22 breaths/min  IPAP 10 cmH20  EPAP 5 cmH2O  PEEP 5 cmH20  FiO2 (%) 40 %  Minute Ventilation 10.2  Leak 47  Peak Inspiratory Pressure (PIP) 15  Tidal Volume (Vt) 478  Patient Home Equipment No  Auto Titrate No  Press High Alarm 35 cmH2O

## 2023-10-05 NOTE — Assessment & Plan Note (Signed)
 Pt came in requiring intubation and was sent to ICU, later extubated back to baseline home O2 requirements of 5L [place].  She also had m acute metabolic encephalopathy 2/2 CO2 retention, but mentation has improved. Respiratory failure thought likely 2/2 OSA/OHS/COPD. - 5L [place] during daytime, O2 sats > 92% - BiPAP at bedtime - Outpt sleep study, sched by PCCM on 2/10 at 11:30 AM

## 2023-10-05 NOTE — Plan of Care (Signed)
  Problem: Nutrition: Goal: Adequate nutrition will be maintained Outcome: Completed/Met   Problem: Coping: Goal: Level of anxiety will decrease Outcome: Progressing   Problem: Elimination: Goal: Will not experience complications related to bowel motility Outcome: Completed/Met Goal: Will not experience complications related to urinary retention Outcome: Completed/Met   Problem: Pain Management: Goal: General experience of comfort will improve Outcome: Progressing

## 2023-10-05 NOTE — TOC Progression Note (Signed)
 Transition of Care [facility]) - Progression Note    Patient Details  Name: Chelsea [name] MRN: [id number] Date of Birth: March 17, 1966  Transition of [facility] CM/SW Contact  Arlana JINNY Nicholaus [place] Phone Number: [phone] 10/05/2023, 2:42 PM  Clinical Narrative: CSW called [facility] and spoke with a front desk representative to inquire about weekend admissions. [facility] does not do weekend admissions. I also left a VM asking to be contacted back in regards to pt.   TOC will continue following.      Expected Discharge Plan: Skilled Nursing Facility Barriers to Discharge: No Barriers Identified  Expected Discharge Plan and Services In-house Referral: Clinical Social Work     Living arrangements for the past 2 months: Skilled Nursing Facility Expected Discharge Date: 10/04/23                                     Social Determinants of Health (SDOH) Interventions SDOH Screenings   Food Insecurity: No Food Insecurity (10/03/2023)  Housing: Low Risk  (10/03/2023)  Transportation Needs: No Transportation Needs (10/03/2023)  Utilities: Not At Risk (10/03/2023)  Alcohol Screen: Low Risk  (07/05/2022)  Financial Resource Strain: Low Risk  (07/05/2022)  Physical Activity: Insufficiently Active (03/27/2021)   Received from [facility]  Social Connections: Unknown (02/04/2022)   Received from [facility]  Stress: No Stress Concern Present (09/15/2021)   Received from [facility]  Tobacco Use: Low Risk  (09/29/2023)    Readmission Risk Interventions    07/23/2022    2:19 PM 04/09/2022    4:17 PM  Readmission Risk Prevention Plan  Transportation Screening Complete Complete  PCP or Specialist Appt within 3-5 Days  Complete  HRI or Home Care Consult  Complete  Social Work Consult for Recovery Care Planning/Counseling  Complete  Palliative Care Screening  Not Applicable  Medication Review [employer]) Complete Referral to Pharmacy  PCP  or Specialist appointment within 3-5 days of discharge Complete   HRI or Home Care Consult Complete   SW Recovery Care/Counseling Consult Complete   Palliative Care Screening Not Applicable   Skilled Nursing Facility Complete

## 2023-10-05 NOTE — Progress Notes (Signed)
 Daily Progress Note Intern Pager: [phone]  Patient name: Chelsea [name] Medical record number: [id number] Date of birth: 1965-12-09 Age: 58 y.o. Gender: female  Primary Care Provider: Pcp, No Consultants: CCM Code Status: Full code  Pt Overview and Major Events to Date:  12/29 - Admitted for acute hypoxemic and hypercarbic respiratory failure and AMS. Intubated. Urine toxicology negative MRSA PCR positive but otherwise cultures are all negative 12/30 - EEG with moderate diffuse encephalopathy, no seizures/epileptiform discharges. PICC placed. 12/31 - Extubation to BiPAP. TF held. Phos/K repleted Diuresis BID titrating up to home doses   Assessment and Plan: Chelsea [name] is a 58 year old female with past medical history of HTN, HLD, HFpEF (echo 05/2023 EF 60 to 65%, normal LV/RV systolic function, degenerative MV, moderate AAS), a flutter (on Eliquis ), OSA/OHS, COPD with chronic respiratory failure (previously trach dependent, since decannulated), T2DM, hypothyroidism, GERD, OA, RLS, chronic pain, depression/anxiety.    Patient was admitted to due to acute metabolic encephalopathy likely secondary to CO2 retention.  Patient was admitted to the ICU and was intubated, now extubated on home 5 L O2 requirement with improved mentation.  Her main concern remains her anxiety.  Should be able to discharge home at this point due to no medical necessity requiring admission, pending transportation and facility approval. Assessment & Plan Acute on chronic respiratory failure with hypoxia and hypercapnia (HCC) Pt came in requiring intubation and was sent to ICU, later extubated back to baseline home O2 requirements of 5L [place].  She also had m acute metabolic encephalopathy 2/2 CO2 retention, but mentation has improved. Respiratory failure thought likely 2/2 OSA/OHS/COPD. - 5L  during daytime, O2 sats > 92% - BiPAP at bedtime - Outpt sleep study, sched by PCCM on 2/10 at 11:30 AM Acute  metabolic encephalopathy (Resolved: 10/05/2023) Thought 2/2 CO2 retention, patient previously intubated in ICU, now extubated and on home O2 requirements. Mentation is appropriate, will continue to hold sedating medications. - Resolved Type 2 diabetes mellitus with hyperglycemia (HCC) Sugars 128-205 overnight.  Increased home LAI to 35 units. - Semglee  35 units at bedtime - CBG qAC/HS - SSI Shock (HCC) Undifferentiated shock, thought to be 2/2 respiratory/septic source. Was on pressors now off. BP slightly soft this am, will continue to monitor.  Completed ceftriaxone  yesterday, blood culture negative for 5 days. - MAP > 65  Chronic and Stable Problems:  A. Flutter: Continue amiodarone  200 mg and eliquis  5 mg, K >4, Mg > 2 HFpEF - Restart torsemide  60 mg BID, and hold jardiance  25 mg/spironolactone  25 mg until more stable Hypothyroidism - Continue levothyroxine  150 mg Gerd - Continue Protonix  40 mg RLS - Continue Requip  0.5 mg HLD - Continue Lipitor 20 mg   FEN/GI: Heart healthy diet PPx: Eliquis  Dispo: Home today hopefully pending transportation and facility approval.   Subjective:  Patient is doing well this morning.  Her mentation is completely improved.  She is on her baseline home O2.  Her major concern today is her anxiety.  She endorses that every time she goes home (which she thinks is too early) she ends up back in the hospital.  We discussed that anxiety is not indication for hospitalization and this is something that should be discussed further with her PCP.  Objective: Temp:  [97.8 F (36.6 C)-98.4 F (36.9 C)] 98.4 F (36.9 C) (01/04 0532) Pulse Rate:  [60-68] 61 (01/04 0740) Resp:  [17-20] 20 (01/04 0532) BP: (107-109)/(70-71) 107/70 (01/04 0532) SpO2:  [94 %-100 %] 94 % (  01/04 0740) FiO2 (%):  [40 %] 40 % (01/04 0133) Weight:  [148.8 kg] 148.8 kg (01/04 0532) Physical Exam: General: NAD, laying comfortably in bed, conversant. Cardiovascular: RRR.  3/6 systolic  ejection murmur heard best at LUSB. Respiratory: CTAB.  Normal WOB on 5 L.  No wheezing, rhonchi, crackles or diminished breath sounds. Abdomen: Soft, nontender, nondistended.  Normoactive bowel sounds. Extremities: Discoloration due to chronic venous stasis.  Cold right RLE vs LLE.  No BLE edema. Neuro: AAOx3.  No focal neurological deficits.  Laboratory: Most recent CBC Lab Results  Component Value Date   WBC 8.2 10/04/2023   HGB 11.4 (L) 10/04/2023   HCT 37.8 10/04/2023   MCV 88.9 10/04/2023   PLT 293 10/04/2023   Most recent BMP    Latest Ref Rng & Units 10/04/2023    5:00 AM  BMP  Glucose 70 - 99 mg/dL 837   BUN 6 - 20 mg/dL 18   Creatinine 9.55 - 1.00 mg/dL 9.11   Sodium 864 - 854 mmol/L 136   Potassium 3.5 - 5.1 mmol/L 4.3   Chloride 98 - 111 mmol/L 92   CO2 22 - 32 mmol/L 35   Calcium  8.9 - 10.3 mg/dL 8.5     Imaging/Diagnostic Tests: No new imaging.  Janna Ferrier, DO 10/05/2023, 9:17 AM  PGY-1, [facility] Family Medicine FPTS Intern pager: [phone], text pages welcome Secure chat group [facility] Teaching Service

## 2023-10-05 NOTE — Assessment & Plan Note (Signed)
 Sugars 128-205 overnight.  Increased home LAI to 35 units. - Semglee 35 units at bedtime - CBG qAC/HS - SSI

## 2023-10-05 NOTE — Assessment & Plan Note (Signed)
 Undifferentiated shock, thought to be 2/2 respiratory/septic source. Was on pressors now off. BP slightly soft this am, will continue to monitor.  Completed ceftriaxone yesterday, blood culture negative for 5 days. - MAP > 65

## 2023-10-05 NOTE — Assessment & Plan Note (Signed)
 Thought 2/2 CO2 retention, patient previously intubated in ICU, now extubated and on home O2 requirements. Mentation is appropriate, will continue to hold sedating medications. -Resolved

## 2023-10-05 NOTE — Discharge Summary (Addendum)
 Family Medicine Teaching [facility] Discharge Summary  Patient name: Chelsea [name] Medical record number: [id number] Date of birth: [birth year] Age: 58 y.o. Gender: female Date of Admission: 09/29/2023  Date of Discharge: 10/07/23  Admitting Physician: Toribio JAYSON Sharps, MD  Primary Care Provider: Pcp, No Consultants: CCM  Indication for Hospitalization: Metabolic encephalopathy and acute on chronic respiratory failure and undifferentiated shock  Discharge Diagnoses/Problem List:  Principal Problem for Admission: Acute on chronic respiratory failure with hypoxia and hypercapnia Other Problems addressed during stay:  Principal Problem:   Acute on chronic respiratory failure with hypoxia and hypercapnia (HCC) Active Problems:   AMS (altered mental status)   Type 2 diabetes mellitus with hyperglycemia (HCC)   Acute on chronic respiratory failure with hypercapnia [facility])  Brief Hospital Course:  RHODA WALDVOGEL is a 58 y.o.female with a history of HTN, HLD, HFpEF, Aflutter, OSA/OHS, COPD w/ Chronic respiratory failure who was admitted to the ICU, and then to [facility] Medicine Teaching Service at [facility] for metabolic encephalopathy, acute on chronic respiratory failure, and undifferentiated shock. Her hospital course is detailed below:  Acute Metabolic Encephalopathy Likely secondary to CO2 narcosis. Resolved following extubation. Patient was at baseline mental status on discharge from the ICU.  Acute on Chronic Respiratory Failure with hypoxia and hypercapnia Patient was initially intubated on admission in the ICU. She was placed on BiPAP following extubation. On transfer out of the ICU she was tolerating home O2 requirement during the day, and BiPAP at night. A VBG was ordered which showed ongoing mild hypercapnia and hypercarbia. PCCM recommended daytime BiPAP with naps for her outpatient. She was stable on her home 5 L of oxygen  at discharge.  Started her on Trelegy upon  discharge instead of Pulmicort  to help manage her symptoms better. Upon discharge her BIPAP settings were IPAP 10 / EPAP 5, FiO2 40%.   Undifferentiated Shock Patient had low blood pressures in the ICU requiring pressor support. Urine and blood cultures were negative for infectious source. Patient was treated with 4 days of ceftriaxone , 1g q24h. She was weaned off pressors before coming to the floor.  Other chronic conditions were medically managed with home medications and formulary alternatives as necessary: Neuropathy: Held gabapentin  inpatient due to sedative effect HFpEF: Diuresed with Lasix  BID. Switched to Torsemide .  Held spironolactone  while inpatient. Anxiety: Continued Xanax  0.5 mg, Wellbutrin  100 mg twice daily, and started on Claritin  due to anxiety and itching she endorsed. T2DM: Increased her insulin  to 35 units long-acting daily.  Held Trulicity and Jardiance  while inpatient. Hypothyroidism: Continued Synthroid  during admission  PCP Follow-up Recommendations: Consider restarting Spironolactone  once BP stabilizes. Ensure she gets her sleep study with Pulmonology on 2/10 at 11:30 AM Recommend GLP-1 outpatient - consider Tirzepatide  or max on Trulicity  Disposition: SNF/LTC facility that she was previously at  Discharge Condition: Stable  Discharge Exam:  Vitals:   10/07/23 0752 10/07/23 1205  BP: 93/61 113/75  Pulse: 72 71  Resp: 18 15  Temp: 98.3 F (36.8 C) 98.6 F (37 C)  SpO2: 94% 94%   Physical Exam: General: no acute distress, laying in bed Cardiovascular: Regular rate and rhythm. Systolic murmur  Respiratory: Limited 2/2 body habitus, CTAB, normal work of breathing on baseline 5L Abdomen: Normal bowel sounds, soft, non-distended Extremities: Chronic BLE edema with overlying skin changes  Significant Procedures: Intubation and extubation  Significant Labs and Imaging:  No results for input(s): WBC, HGB, HCT, PLT in the last 48 hours.  No results  for  input(s): NA, K, CL, CO2, GLUCOSE, BUN, CREATININE, CALCIUM , MG, PHOS, ALKPHOS, AST, ALT, ALBUMIN , PROTEIN in the last 48 hours.  CXR: Stable support apparatus. Increased right basilar opacity concerning for atelectasis or possibly pneumonia.  CT chest without contrast: 1. Bilateral geographic ground-glass and consolidative airspace opacities greatest in the lower lobes. Findings are concerning for multifocal pneumonia. Pulmonary edema could appear similarly. Follow-up is recommended to ensure resolution. 2. Small left pleural effusion. 3. Enlarged main pulmonary artery compatible with pulmonary arterial hypertension.  Aortic Atherosclerosis (ICD10-I70.0).  CT Head without contrast: 1. No acute intracranial abnormality. 2. Moderate opacification of the left sphenoid sinus with increased central attenuation, possibly reflecting inspissated secretions or blood products  RUQ US : Heterogeneous echogenic liver. No biliary ductal dilatation.  Previous cholecystectomy. Trace ascites  Results/Tests Pending at Time of Discharge: none  Discharge Medications:  Allergies as of 10/07/2023       Reactions   Iodinated Contrast Media Anaphylaxis   Penicillins Anaphylaxis   Patient tolerates cefepime  (08/2017)   Insulin  Lispro Other (See Comments)   Adder per MAR from SNF   Minoxidil Hives        Medication List     STOP taking these medications    budesonide  0.5 MG/2ML nebulizer solution Commonly known as: PULMICORT    gabapentin  400 MG capsule Commonly known as: NEURONTIN    melatonin 3 MG Tabs tablet   oxybutynin 5 MG tablet Commonly known as: DITROPAN   spironolactone  25 MG tablet Commonly known as: ALDACTONE        TAKE these medications    albuterol  (2.5 MG/3ML) 0.083% nebulizer solution Commonly known as: PROVENTIL  Take 3 mLs (2.5 mg total) by nebulization every 6 (six) hours as needed for wheezing or shortness of breath (I50.32).    ALPRAZolam  0.5 MG tablet Commonly known as: XANAX  Take 0.5 mg by mouth 2 (two) times daily as needed for anxiety.   amiodarone  200 MG tablet Commonly known as: PACERONE  Take 1 tablet (200 mg total) by mouth daily.   ammonium lactate  12 % lotion Commonly known as: LAC-HYDRIN  Apply 1 Application topically at bedtime.   atorvastatin  20 MG tablet Commonly known as: LIPITOR Take 20 mg by mouth every evening.   buPROPion  ER 100 MG 12 hr tablet Commonly known as: WELLBUTRIN  SR Take 1 tablet (100 mg total) by mouth 2 (two) times daily.   busPIRone  7.5 MG tablet Commonly known as: BUSPAR  Take 1 tablet (7.5 mg total) by mouth 2 (two) times daily.   Cholecalciferol  125 MCG (5000 UT) Tabs Take 1 tablet (5,000 Units total) by mouth daily.   Eliquis  5 MG Tabs tablet Generic drug: apixaban  Take 5 mg by mouth 2 (two) times daily.   empagliflozin  25 MG Tabs tablet Commonly known as: JARDIANCE  Take 1 tablet (25 mg total) by mouth daily.   ferrous sulfate  325 (65 FE) MG EC tablet Take 325 mg by mouth daily.   insulin  glargine 100 UNIT/ML Solostar Pen Commonly known as: LANTUS  Inject 35 Units into the skin in the morning. What changed: how much to take   insulin  glargine-yfgn 100 UNIT/ML injection Commonly known as: SEMGLEE  Inject 0.4 mLs (40 Units total) into the skin at bedtime.   levothyroxine  150 MCG tablet Commonly known as: SYNTHROID  Take 1 tablet (150 mcg total) by mouth daily at 6 (six) AM. What changed: when to take this   loperamide  2 MG capsule Commonly known as: IMODIUM  Take 2 mg by mouth every 4 (four) hours as needed for diarrhea or loose  stools.   loratadine  10 MG tablet Commonly known as: CLARITIN  Take 1 tablet (10 mg total) by mouth daily.   montelukast  10 MG tablet Commonly known as: SINGULAIR  Take 10 mg by mouth at bedtime.   multivitamin with minerals Tabs tablet Take 1 tablet by mouth daily.   NON FORMULARY Apply BIPAP every night at bedtime.    nystatin  cream Commonly known as: MYCOSTATIN  Apply topically 2 (two) times daily.   omeprazole  20 MG capsule Commonly known as: PRILOSEC Take 20 mg by mouth daily.   oxyCODONE -acetaminophen  10-325 MG tablet Commonly known as: PERCOCET Take 1 tablet by mouth every 6 (six) hours.   OXYGEN  Inhale 5 L/min into the lungs in the morning and at bedtime. Connect to BIPAP qhs   potassium chloride  SA 20 MEQ tablet Commonly known as: KLOR-CON  M Take 2 tablets (40 mEq total) by mouth daily.   rOPINIRole  0.5 MG tablet Commonly known as: REQUIP  Take 1 tablet (0.5 mg total) by mouth at bedtime.   torsemide  20 MG tablet Commonly known as: DEMADEX  Take 60 mg by mouth 2 (two) times daily.   Trelegy Ellipta  100-62.5-25 MCG/ACT Aepb Generic drug: Fluticasone -Umeclidin-Vilant Inhale 1 puff into the lungs daily.   Trulicity 0.75 MG/0.5ML Soaj Generic drug: Dulaglutide Inject 0.75 mg into the skin every Saturday.         Discharge Instructions: Please refer to Patient Instructions section of EMR for full details.  Patient was counseled important signs and symptoms that should prompt return to medical care, changes in medications, dietary instructions, activity restrictions, and follow up appointments.   Follow-Up Appointments:  Contact information for follow-up providers     [facility] Follow up on 11/11/2023.   Specialty: Pulmonology Why: 11:30 AM Contact information: [address] [place]  [phone]             Contact information for after-discharge care     Destination     [facility] Preferred SNF .   Service: Skilled Nursing Contact information: [address] [place]  [address] [phone]                     Stoney Blizzard, DO 10/07/2023, 12:43 PM PGY-1, [facility]   Upper Level Addendum: I have reviewed the above note,  making necessary revisions as appropriate. I agree with the medical decision making and physical exam as noted above.  Gladis Church, DO PGY-2 [facility] Family Medicine Residency

## 2023-10-06 LAB — GLUCOSE, CAPILLARY
Glucose-Capillary: 188 mg/dL — ABNORMAL HIGH (ref 70–99)
Glucose-Capillary: 256 mg/dL — ABNORMAL HIGH (ref 70–99)
Glucose-Capillary: 172 mg/dL — ABNORMAL HIGH (ref 70–99)

## 2023-10-06 MED ORDER — EMPAGLIFLOZIN 25 MG PO TABS
25.0000 mg | ORAL_TABLET | Freq: Every day | ORAL | Status: DC
  Administered 2023-10-06 – 2023-10-07 (×2): 25 mg via ORAL
  Filled 2023-10-06 (×2): qty 1

## 2023-10-06 MED ORDER — INSULIN GLARGINE-YFGN 100 UNIT/ML ~~LOC~~ SOLN: 40.0000 [IU] | Freq: Every day | SUBCUTANEOUS | Status: DC

## 2023-10-06 MED ORDER — INSULIN GLARGINE-YFGN 100 UNIT/ML ~~LOC~~ SOLN
40.0000 [IU] | Freq: Every day | SUBCUTANEOUS | Status: DC
  Administered 2023-10-06: 40 [IU] via SUBCUTANEOUS
  Filled 2023-10-06 (×2): qty 0.4

## 2023-10-06 NOTE — Progress Notes (Signed)
     Daily Progress Note Intern Pager: [phone]  Patient name: Chelsea [name] Medical record number: [id number] Date of birth: March 17, 1966 Age: 58 y.o. Gender: female  Primary Care Provider: Pcp, No Consultants: PCCM Code Status: Full  Pt Overview and Major Events to Date:  12/29 - admitted to ICU, intubated 12/31- extubated 1/2- transferred to FMTS  Assessment and Plan: Chelsea [name] is a 58 year old female admitted to FMTS after an ICU stay for acute metabolic encephalopathy thought 2/2 CO2 retention related to her chronic respiratory failure. Pertinent PMH/PSH includes OSA/OHS, COPD, T2DM, hypothyroidism, GERD, RLS, chronic pain, anxiety and depression. She is now medically stable and awaiting transfer back to her long-term care facility .   Assessment & Plan Acute on chronic respiratory failure with hypoxia and hypercapnia (HCC) She is back to her baseline O2 of 5L and is mentating at her baseline this morning. Medically stable and awaiting transfer back to her long-term care facility tomorrow.  - 5L [place] during daytime, O2 sats > 92% - BiPAP at bedtime - Outpt sleep study, sched by PCCM on 2/10 at 11:30 AM Type 2 diabetes mellitus with hyperglycemia (HCC) Sugars in the mid-200s.  Got 20 units of short acting insulin  yesterday. - Will increase her long-acting insulin  to 40 units QHS - CBG qAC/HS - SSI Shock (HCC) (Resolved: 10/06/2023)   Chronic and Stable Conditions Atrial Flutter - continue amiodarone  200mg  daily, Eliquis  5mg  daily  HFpEF - home torsemide  60mg  BID, can restart home Jardiance   Hypothyroidism - levothyroxine   GERD - protonix  40mg  daily RLS - Requip  0.5mg  at bedtime HLD - Lipitor 20mg  daily    FEN/GI: Heart PPx: On Eliquis   Dispo:Return to facility on 1/6   Subjective:  Sleeping on arrival, awakened easily to voice. Answered yes/no questions appropriately, though was wearing BiPAP so did not speak much. Denied any new/acute complaints this  morning. Eager to get home.  Objective: Temp:  [97.9 F (36.6 C)-98.4 F (36.9 C)] 98.1 F (36.7 C) (01/04 1938) Pulse Rate:  [60-74] 66 (01/05 0059) Resp:  [18-20] 20 (01/04 1938) BP: (107-115)/(66-73) 114/73 (01/04 1938) SpO2:  [94 %-96 %] 96 % (01/05 0059) Weight:  [148.8 kg] 148.8 kg (01/04 0532) Physical Exam: General: Chronically ill appearing, NAD, awakened quickly from sleep Cardiovascular: RRR, 3/6 systolic murmur heard over BiPAP Respiratory: Extremely limited exam 2/2 body habitus and BiPAP but no respiratory distress Abdomen: Obese, non-tender Extremities: Venous stasis changes to anterior shins bilaterally   Laboratory: Most recent CBC Lab Results  Component Value Date   WBC 8.2 10/04/2023   HGB 11.4 (L) 10/04/2023   HCT 37.8 10/04/2023   MCV 88.9 10/04/2023   PLT 293 10/04/2023   Most recent BMP    Latest Ref Rng & Units 10/04/2023    5:00 AM  BMP  Glucose 70 - 99 mg/dL 837   BUN 6 - 20 mg/dL 18   Creatinine 9.55 - 1.00 mg/dL 9.11   Sodium 864 - 854 mmol/L 136   Potassium 3.5 - 5.1 mmol/L 4.3   Chloride 98 - 111 mmol/L 92   CO2 22 - 32 mmol/L 35   Calcium  8.9 - 10.3 mg/dL 8.5      Imaging/Diagnostic Tests: No new imaging, tests   Marlee Lynwood NOVAK, MD 10/06/2023, 4:42 AM  PGY-3, [place] [facility] FPTS Intern pager: [phone], text pages welcome Secure chat group [facility] Teaching Service

## 2023-10-06 NOTE — Assessment & Plan Note (Signed)
 She is back to her baseline O2 of 5L and is mentating at her baseline this morning. Medically stable and awaiting transfer back to her long-term care facility tomorrow.  - 5L [place] during daytime, O2 sats > 92% - BiPAP at bedtime - Outpt sleep study, sched by PCCM on 2/10 at 11:30 AM

## 2023-10-06 NOTE — Plan of Care (Signed)
   Problem: Coping: Goal: Level of anxiety will decrease Outcome: Progressing   Problem: Pain Management: Goal: General experience of comfort will improve Outcome: Progressing   Problem: Safety: Goal: Ability to remain free from injury will improve Outcome: Progressing

## 2023-10-06 NOTE — Plan of Care (Signed)
  Problem: Education: Goal: Ability to describe self-care measures that may prevent or decrease complications (Diabetes Survival Skills Education) will improve Outcome: Progressing Goal: Individualized Educational Video(s) Outcome: Progressing   Problem: Coping: Goal: Ability to adjust to condition or change in health will improve Outcome: Progressing   Problem: Fluid Volume: Goal: Ability to maintain a balanced intake and output will improve Outcome: Progressing   Problem: Health Behavior/Discharge Planning: Goal: Ability to identify and utilize available resources and services will improve Outcome: Progressing Goal: Ability to manage health-related needs will improve Outcome: Progressing   Problem: Metabolic: Goal: Ability to maintain appropriate glucose levels will improve Outcome: Progressing   Problem: Nutritional: Goal: Maintenance of adequate nutrition will improve Outcome: Progressing Goal: Progress toward achieving an optimal weight will improve Outcome: Progressing   Problem: Skin Integrity: Goal: Risk for impaired skin integrity will decrease Outcome: Progressing   Problem: Tissue Perfusion: Goal: Adequacy of tissue perfusion will improve Outcome: Progressing   Problem: Education: Goal: Knowledge of General Education information will improve Description: Including pain rating scale, medication(s)/side effects and non-pharmacologic comfort measures Outcome: Progressing   Problem: Health Behavior/Discharge Planning: Goal: Ability to manage health-related needs will improve Outcome: Progressing   Problem: Clinical Measurements: Goal: Ability to maintain clinical measurements within normal limits will improve Outcome: Progressing Goal: Will remain free from infection Outcome: Progressing Goal: Diagnostic test results will improve Outcome: Progressing Goal: Respiratory complications will improve Outcome: Progressing Goal: Cardiovascular complication will  be avoided Outcome: Progressing   Problem: Activity: Goal: Risk for activity intolerance will decrease Outcome: Progressing   Problem: Coping: Goal: Level of anxiety will decrease Outcome: Progressing   Problem: Pain Management: Goal: General experience of comfort will improve Outcome: Progressing   Problem: Safety: Goal: Ability to remain free from injury will improve Outcome: Progressing   Problem: Skin Integrity: Goal: Risk for impaired skin integrity will decrease Outcome: Progressing

## 2023-10-06 NOTE — Plan of Care (Signed)
  Problem: Education: Goal: Ability to describe self-care measures that may prevent or decrease complications (Diabetes Survival Skills Education) will improve 10/06/2023 1516 by Emil Roselie SAUNDERS, RN Outcome: Progressing 10/06/2023 1516 by Emil Roselie SAUNDERS, RN Outcome: Progressing Goal: Individualized Educational Video(s) 10/06/2023 1516 by Emil Roselie SAUNDERS, RN Outcome: Progressing 10/06/2023 1516 by Emil Roselie SAUNDERS, RN Outcome: Progressing   Problem: Coping: Goal: Ability to adjust to condition or change in health will improve 10/06/2023 1516 by Emil Roselie SAUNDERS, RN Outcome: Progressing 10/06/2023 1516 by Emil Roselie SAUNDERS, RN Outcome: Progressing   Problem: Fluid Volume: Goal: Ability to maintain a balanced intake and output will improve 10/06/2023 1516 by Emil Roselie SAUNDERS, RN Outcome: Progressing 10/06/2023 1516 by Emil Roselie SAUNDERS, RN Outcome: Progressing   Problem: Health Behavior/Discharge Planning: Goal: Ability to identify and utilize available resources and services will improve 10/06/2023 1516 by Emil Roselie SAUNDERS, RN Outcome: Progressing 10/06/2023 1516 by Emil Roselie SAUNDERS, RN Outcome: Progressing Goal: Ability to manage health-related needs will improve 10/06/2023 1516 by Emil Roselie SAUNDERS, RN Outcome: Progressing 10/06/2023 1516 by Emil Roselie SAUNDERS, RN Outcome: Progressing   Problem: Metabolic: Goal: Ability to maintain appropriate glucose levels will improve 10/06/2023 1516 by Emil Roselie SAUNDERS, RN Outcome: Progressing 10/06/2023 1516 by Emil Roselie SAUNDERS, RN Outcome: Progressing   Problem: Nutritional: Goal: Maintenance of adequate nutrition will improve 10/06/2023 1516 by Emil Roselie SAUNDERS, RN Outcome: Progressing 10/06/2023 1516 by Emil Roselie SAUNDERS, RN Outcome: Progressing Goal: Progress toward achieving an optimal weight will improve 10/06/2023 1516 by Emil Roselie SAUNDERS, RN Outcome: Progressing 10/06/2023 1516 by Emil Roselie SAUNDERS, RN Outcome:  Progressing   Problem: Skin Integrity: Goal: Risk for impaired skin integrity will decrease Outcome: Progressing   Problem: Tissue Perfusion: Goal: Adequacy of tissue perfusion will improve Outcome: Progressing   Problem: Education: Goal: Knowledge of General Education information will improve Description: Including pain rating scale, medication(s)/side effects and non-pharmacologic comfort measures Outcome: Progressing   Problem: Health Behavior/Discharge Planning: Goal: Ability to manage health-related needs will improve Outcome: Progressing   Problem: Clinical Measurements: Goal: Ability to maintain clinical measurements within normal limits will improve Outcome: Progressing Goal: Will remain free from infection Outcome: Progressing Goal: Diagnostic test results will improve Outcome: Progressing Goal: Respiratory complications will improve Outcome: Progressing Goal: Cardiovascular complication will be avoided Outcome: Progressing   Problem: Activity: Goal: Risk for activity intolerance will decrease Outcome: Progressing   Problem: Coping: Goal: Level of anxiety will decrease Outcome: Progressing   Problem: Pain Management: Goal: General experience of comfort will improve Outcome: Progressing   Problem: Safety: Goal: Ability to remain free from injury will improve Outcome: Progressing   Problem: Skin Integrity: Goal: Risk for impaired skin integrity will decrease Outcome: Progressing

## 2023-10-06 NOTE — Assessment & Plan Note (Signed)
 Sugars in the mid-200s.  Got 20 units of short acting insulin yesterday. - Will increase her long-acting insulin to 40 units QHS - CBG qAC/HS - SSI

## 2023-10-07 LAB — GLUCOSE, CAPILLARY
Glucose-Capillary: 180 mg/dL — ABNORMAL HIGH (ref 70–99)
Glucose-Capillary: 216 mg/dL — ABNORMAL HIGH (ref 70–99)
Glucose-Capillary: 154 mg/dL — ABNORMAL HIGH (ref 70–99)

## 2023-10-07 MED ORDER — BUSPIRONE HCL 5 MG PO TABS
7.5000 mg | ORAL_TABLET | Freq: Two times a day (BID) | ORAL | Status: DC
  Administered 2023-10-07: 7.5 mg via ORAL
  Filled 2023-10-07: qty 2

## 2023-10-07 MED ORDER — BUSPIRONE HCL 7.5 MG PO TABS: 7.5000 mg | ORAL_TABLET | Freq: Two times a day (BID) | ORAL | Status: DC

## 2023-10-07 NOTE — Progress Notes (Addendum)
 Rn spoke with Richardine Service at [facility] for report

## 2023-10-07 NOTE — Care Management Important Message (Signed)
 Important Message  Patient Details  Name: Chelsea [name] MRN: [id number] Date of Birth: 02/25/1966   Important Message Given:  Yes - Medicare IM     Renie Ora 10/07/2023, 12:21 PM

## 2023-10-07 NOTE — Progress Notes (Signed)
 Pt has  been on bed pan for 2 hours, refuses to get off. RN educated pt on bedsores if staying on bedpan too long, pt stated "she takes a long time to go to bathroomWriter notified of prolonged bedpan use

## 2023-10-07 NOTE — Inpatient Diabetes Management (Signed)
 Inpatient Diabetes Program Recommendations  AACE/ADA: New Consensus Statement on Inpatient Glycemic Control (2015)  Target Ranges:  Prepandial:   less than 140 mg/dL      Peak postprandial:   less than 180 mg/dL (1-2 hours)      Critically ill patients:  140 - 180 mg/dL    Latest Reference Range & Units 10/06/23 07:40 10/06/23 12:18 10/06/23 16:33 10/06/23 21:14  Glucose-Capillary 70 - 99 mg/dL 811 (H)  4 units Novolog   180 (H)  4 units Novolog  @1343   256 (H)  11 units Novolog   172 (H)    40 units Semglee   (H): Data is abnormally high  Latest Reference Range & Units 10/07/23 07:50  Glucose-Capillary 70 - 99 mg/dL 783 (H)  7 units Novolog    (H): Data is abnormally high     Home DM Meds: Trulicity 0.75 mg Qweek        Jardiance  25 mg daily        Lantus  30 units daily  Current Orders: Semglee  40 units at bedtime      Jardiance  25 mg daily      Novolog  Resistant Correction Scale/ SSI (0-20 units) TID AC + HS    MD- Please consider:  1. Increasing Semglee  to 45 units at bedtime  2. Start Novolog  Meal Coverage: Novolog  3 units TID with meals HOLD if pt NPO HOLD if pt eats <50% meals     --Will follow patient during hospitalization--  Adina Rudolpho Arrow RN, MSN, CDCES Diabetes Coordinator Inpatient Glycemic Control Team Team Pager: [phone] (8a-5p)

## 2023-10-07 NOTE — TOC Progression Note (Signed)
 Transition of Care [facility]) - Progression Note    Patient Details  Name: Chelsea [name] MRN: [id number] Date of Birth: 06-Sep-1966  [facility] CM/SW Contact  Isaiah Public, LCSWA Phone Number: 10/07/2023, 11:44 AM  Clinical Narrative:     CSW received notification from  MD patient medically stable for dc. CSW called Kia with [facility]. CSW LVM. CSW awaiting call back to confirm patient able to return back to facility today. CSW informed MD.   Expected Discharge Plan: Skilled Nursing Facility Barriers to Discharge: No Barriers Identified  Expected Discharge Plan and Services In-house Referral: Clinical Social Work     Living arrangements for the past 2 months: Skilled Nursing Facility Expected Discharge Date: 10/04/23                                     Social Determinants of Health (SDOH) Interventions SDOH Screenings   Food Insecurity: No Food Insecurity (10/03/2023)  Housing: Low Risk  (10/03/2023)  Transportation Needs: No Transportation Needs (10/03/2023)  Utilities: Not At Risk (10/03/2023)  Alcohol Screen: Low Risk  (07/05/2022)  Financial Resource Strain: Low Risk  (07/05/2022)  Physical Activity: Insufficiently Active (03/27/2021)   Received from [facility]  Social Connections: Unknown (02/04/2022)   Received from [facility], [facility]  Stress: No Stress Concern Present (09/15/2021)   Received from [facility], [facility]  Tobacco Use: Low Risk  (09/29/2023)    Readmission Risk Interventions    07/23/2022    2:19 PM 04/09/2022    4:17 PM  Readmission Risk Prevention Plan  Transportation Screening Complete Complete  PCP or Specialist Appt within 3-5 Days  Complete  HRI or Home Care Consult  Complete  Social Work Consult for Recovery Care Planning/Counseling  Complete  Palliative Care Screening  Not Applicable  Medication Review Oceanographer) Complete Referral to Pharmacy  PCP or Specialist appointment within 3-5  days of discharge Complete   HRI or Home Care Consult Complete   SW Recovery Care/Counseling Consult Complete   Palliative Care Screening Not Applicable   Skilled Nursing Facility Complete

## 2023-10-07 NOTE — TOC Transition Note (Signed)
 Transition of Care [facility]) - Discharge Note   Patient Details  Name: Chelsea [name] MRN: [id number] Date of Birth: 04-24-1966  [facility]) CM/SW Contact:  Isaiah Public, LCSWA Phone Number: 10/07/2023, 12:57 PM   Clinical Narrative:     Patient will DC to: [facility]  Anticipated DC date: 10/07/2023  Family notified: Dewayne   Transport by [facility]  ?  Per MD patient ready for DC to [facility] . RN, patient, patient's family, and facility notified of DC.Bipap at facility. Discharge Summary sent to facility. RN given number for report [phone]. Kia with [facility] informed CSW patient will return back to her previous LTC room.. DC packet on chart. Ambulance transport requested for patient.  CSW signing off.    Final next level of care: Skilled Nursing Facility Barriers to Discharge: No Barriers Identified   Patient Goals and CMS Choice Patient states their goals for this hospitalization and ongoing recovery are:: SNF   Choice offered to / list presented to : Patient      Discharge Placement              Patient chooses bed at: [facility] Patient to be transferred to facility by: [facility] Name of family member notified: Dewayne Patient and family notified of of transfer: 10/07/23  Discharge Plan and Services Additional resources added to the After Visit Summary for   In-house Referral: Clinical Social Work                                   Social Drivers of Health (SDOH) Interventions SDOH Screenings   Food Insecurity: No Food Insecurity (10/03/2023)  Housing: Low Risk  (10/03/2023)  Transportation Needs: No Transportation Needs (10/03/2023)  Utilities: Not At Risk (10/03/2023)  Alcohol Screen: Low Risk  (07/05/2022)  Financial Resource Strain: Low Risk  (07/05/2022)  Physical Activity: Insufficiently Active (03/27/2021)   Received from [facility], [facility]  Social Connections: Unknown (02/04/2022)   Received from [facility], [facility]   Stress: No Stress Concern Present (09/15/2021)   Received from [facility], [facility]  Tobacco Use: Low Risk  (09/29/2023)     Readmission Risk Interventions    07/23/2022    2:19 PM 04/09/2022    4:17 PM  Readmission Risk Prevention Plan  Transportation Screening Complete Complete  PCP or Specialist Appt within 3-5 Days  Complete  HRI or Home Care Consult  Complete  Social Work Consult for Recovery Care Planning/Counseling  Complete  Palliative Care Screening  Not Applicable  Medication Review Oceanographer) Complete Referral to Pharmacy  PCP or Specialist appointment within 3-5 days of discharge Complete   HRI or Home Care Consult Complete   SW Recovery Care/Counseling Consult Complete   Palliative Care Screening Not Applicable   Skilled Nursing Facility Complete

## 2023-10-07 NOTE — Assessment & Plan Note (Deleted)
 Sugars in the mid-200s.  Got 20 units of short acting insulin yesterday. - Will increase her long-acting insulin to 40 units QHS - CBG qAC/HS - SSI

## 2023-10-07 NOTE — Progress Notes (Signed)
 PICC Removal Note: PICC line removed from RUE. PICC catheter tip visualized and intact. Pressure held until hemostasis achieved. Pressure dressing applied. No redness, ecchymosis, edema, swelling, or drainage noted at site. Instructions provided on post PICC discharge care, including followup notification instructions.  Bedrest until 1415.

## 2023-10-07 NOTE — Assessment & Plan Note (Deleted)
 She is back to her baseline O2 of 5L and is mentating at her baseline this morning. Medically stable and awaiting transfer back to her long-term care facility tomorrow.  - 5L [place] during daytime, O2 sats > 92% - BiPAP at bedtime - Outpt sleep study, sched by PCCM on 2/10 at 11:30 AM

## 2023-11-03 ENCOUNTER — Other Ambulatory Visit: Payer: Self-pay

## 2023-11-03 ENCOUNTER — Inpatient Hospital Stay (HOSPITAL_COMMUNITY)
Admission: EM | Admit: 2023-11-03 | Discharge: 2023-11-15 | DRG: 193 | Disposition: A | Discharge status: Skilled Nursing Facility | Payer: Medicare Other | Source: Skilled Nursing Facility | Attending: Family Medicine | Admitting: Family Medicine

## 2023-11-03 ENCOUNTER — Emergency Department (HOSPITAL_COMMUNITY): Payer: Medicare Other

## 2023-11-03 ENCOUNTER — Encounter (HOSPITAL_COMMUNITY): Payer: Self-pay

## 2023-11-03 DIAGNOSIS — R4182 Altered mental status, unspecified: Secondary | ICD-10-CM | POA: Diagnosis present

## 2023-11-03 DIAGNOSIS — J441 Chronic obstructive pulmonary disease with (acute) exacerbation: Secondary | ICD-10-CM | POA: Diagnosis not present

## 2023-11-03 DIAGNOSIS — E1165 Type 2 diabetes mellitus with hyperglycemia: Secondary | ICD-10-CM | POA: Diagnosis present

## 2023-11-03 DIAGNOSIS — E782 Mixed hyperlipidemia: Secondary | ICD-10-CM | POA: Diagnosis present

## 2023-11-03 DIAGNOSIS — E1141 Type 2 diabetes mellitus with diabetic mononeuropathy: Secondary | ICD-10-CM | POA: Diagnosis present

## 2023-11-03 DIAGNOSIS — J9601 Acute respiratory failure with hypoxia: Secondary | ICD-10-CM

## 2023-11-03 DIAGNOSIS — I2510 Atherosclerotic heart disease of native coronary artery without angina pectoris: Secondary | ICD-10-CM | POA: Diagnosis present

## 2023-11-03 DIAGNOSIS — Z794 Long term (current) use of insulin: Secondary | ICD-10-CM

## 2023-11-03 DIAGNOSIS — Z6841 Body Mass Index (BMI) 40.0 and over, adult: Secondary | ICD-10-CM

## 2023-11-03 DIAGNOSIS — J9622 Acute and chronic respiratory failure with hypercapnia: Secondary | ICD-10-CM | POA: Diagnosis present

## 2023-11-03 DIAGNOSIS — R000 Tachycardia, unspecified: Secondary | ICD-10-CM | POA: Diagnosis not present

## 2023-11-03 DIAGNOSIS — Z888 Allergy status to other drugs, medicaments and biological substances status: Secondary | ICD-10-CM

## 2023-11-03 DIAGNOSIS — Z7985 Long-term (current) use of injectable non-insulin antidiabetic drugs: Secondary | ICD-10-CM

## 2023-11-03 DIAGNOSIS — J440 Chronic obstructive pulmonary disease with acute lower respiratory infection: Secondary | ICD-10-CM | POA: Diagnosis present

## 2023-11-03 DIAGNOSIS — I480 Paroxysmal atrial fibrillation: Secondary | ICD-10-CM | POA: Diagnosis not present

## 2023-11-03 DIAGNOSIS — Z9981 Dependence on supplemental oxygen: Secondary | ICD-10-CM

## 2023-11-03 DIAGNOSIS — E662 Morbid (severe) obesity with alveolar hypoventilation: Secondary | ICD-10-CM | POA: Diagnosis present

## 2023-11-03 DIAGNOSIS — Z7901 Long term (current) use of anticoagulants: Secondary | ICD-10-CM

## 2023-11-03 DIAGNOSIS — J09X1 Influenza due to identified novel influenza A virus with pneumonia: Secondary | ICD-10-CM | POA: Diagnosis not present

## 2023-11-03 DIAGNOSIS — J9602 Acute respiratory failure with hypercapnia: Secondary | ICD-10-CM | POA: Diagnosis present

## 2023-11-03 DIAGNOSIS — Z8541 Personal history of malignant neoplasm of cervix uteri: Secondary | ICD-10-CM

## 2023-11-03 DIAGNOSIS — J9621 Acute and chronic respiratory failure with hypoxia: Principal | ICD-10-CM

## 2023-11-03 DIAGNOSIS — G2581 Restless legs syndrome: Secondary | ICD-10-CM | POA: Diagnosis present

## 2023-11-03 DIAGNOSIS — N179 Acute kidney failure, unspecified: Secondary | ICD-10-CM | POA: Diagnosis present

## 2023-11-03 DIAGNOSIS — I2609 Other pulmonary embolism with acute cor pulmonale: Secondary | ICD-10-CM | POA: Diagnosis present

## 2023-11-03 DIAGNOSIS — Z91199 Patient's noncompliance with other medical treatment and regimen due to unspecified reason: Secondary | ICD-10-CM

## 2023-11-03 DIAGNOSIS — I4892 Unspecified atrial flutter: Secondary | ICD-10-CM | POA: Diagnosis not present

## 2023-11-03 DIAGNOSIS — E66813 Obesity, class 3: Secondary | ICD-10-CM | POA: Diagnosis present

## 2023-11-03 DIAGNOSIS — Z91041 Radiographic dye allergy status: Secondary | ICD-10-CM

## 2023-11-03 DIAGNOSIS — I872 Venous insufficiency (chronic) (peripheral): Secondary | ICD-10-CM | POA: Diagnosis present

## 2023-11-03 DIAGNOSIS — E039 Hypothyroidism, unspecified: Secondary | ICD-10-CM | POA: Diagnosis present

## 2023-11-03 DIAGNOSIS — E871 Hypo-osmolality and hyponatremia: Secondary | ICD-10-CM | POA: Diagnosis not present

## 2023-11-03 DIAGNOSIS — Z993 Dependence on wheelchair: Secondary | ICD-10-CM

## 2023-11-03 DIAGNOSIS — D6869 Other thrombophilia: Secondary | ICD-10-CM | POA: Diagnosis not present

## 2023-11-03 DIAGNOSIS — J189 Pneumonia, unspecified organism: Principal | ICD-10-CM

## 2023-11-03 DIAGNOSIS — I5033 Acute on chronic diastolic (congestive) heart failure: Secondary | ICD-10-CM | POA: Diagnosis present

## 2023-11-03 DIAGNOSIS — Z79899 Other long term (current) drug therapy: Secondary | ICD-10-CM

## 2023-11-03 DIAGNOSIS — K219 Gastro-esophageal reflux disease without esophagitis: Secondary | ICD-10-CM | POA: Diagnosis present

## 2023-11-03 DIAGNOSIS — J101 Influenza due to other identified influenza virus with other respiratory manifestations: Secondary | ICD-10-CM | POA: Insufficient documentation

## 2023-11-03 DIAGNOSIS — I110 Hypertensive heart disease with heart failure: Secondary | ICD-10-CM | POA: Diagnosis present

## 2023-11-03 DIAGNOSIS — Z8249 Family history of ischemic heart disease and other diseases of the circulatory system: Secondary | ICD-10-CM

## 2023-11-03 DIAGNOSIS — D649 Anemia, unspecified: Secondary | ICD-10-CM | POA: Diagnosis present

## 2023-11-03 DIAGNOSIS — G8929 Other chronic pain: Secondary | ICD-10-CM | POA: Diagnosis present

## 2023-11-03 DIAGNOSIS — Z7984 Long term (current) use of oral hypoglycemic drugs: Secondary | ICD-10-CM

## 2023-11-03 DIAGNOSIS — Z880 Allergy status to penicillin: Secondary | ICD-10-CM

## 2023-11-03 DIAGNOSIS — I4891 Unspecified atrial fibrillation: Secondary | ICD-10-CM

## 2023-11-03 DIAGNOSIS — I272 Pulmonary hypertension, unspecified: Secondary | ICD-10-CM | POA: Diagnosis present

## 2023-11-03 DIAGNOSIS — Z79890 Hormone replacement therapy (postmenopausal): Secondary | ICD-10-CM

## 2023-11-03 DIAGNOSIS — Z87891 Personal history of nicotine dependence: Secondary | ICD-10-CM

## 2023-11-03 DIAGNOSIS — I509 Heart failure, unspecified: Secondary | ICD-10-CM

## 2023-11-03 DIAGNOSIS — I5030 Unspecified diastolic (congestive) heart failure: Secondary | ICD-10-CM | POA: Diagnosis present

## 2023-11-03 HISTORY — DX: Acute respiratory failure with hypoxia: J96.01

## 2023-11-03 LAB — BASIC METABOLIC PANEL
Anion gap: 13 (ref 5–15)
BUN: 27 mg/dL — ABNORMAL HIGH (ref 6–20)
CO2: 31 mmol/L (ref 22–32)
Calcium: 8.3 mg/dL — ABNORMAL LOW (ref 8.9–10.3)
Chloride: 92 mmol/L — ABNORMAL LOW (ref 98–111)
Creatinine, Ser: 0.94 mg/dL (ref 0.44–1.00)
GFR, Estimated: >60 mL/min (ref >60)
Glucose, Bld: 218 mg/dL — ABNORMAL HIGH (ref 70–99)
Potassium: 4.2 mmol/L (ref 3.5–5.1)
Sodium: 136 mmol/L (ref 135–145)

## 2023-11-03 LAB — I-STAT VENOUS BLOOD GAS, ED
Acid-Base Excess: 12 mmol/L — ABNORMAL HIGH (ref 0.0–2.0)
Bicarbonate: 39.8 mmol/L — ABNORMAL HIGH (ref 20.0–28.0)
Calcium, Ion: 1.05 mmol/L — ABNORMAL LOW (ref 1.15–1.40)
HCT: 40 % (ref 36.0–46.0)
Hemoglobin: 13.6 g/dL (ref 12.0–15.0)
O2 Saturation: 79 %
Potassium: 4.2 mmol/L (ref 3.5–5.1)
Sodium: 137 mmol/L (ref 135–145)
TCO2: 42 mmol/L — ABNORMAL HIGH (ref 22–32)
pCO2, Ven: 65.1 mm[Hg] — ABNORMAL HIGH (ref 44–60)
pH, Ven: 7.394 (ref 7.25–7.43)
pO2, Ven: 46 mm[Hg] — ABNORMAL HIGH (ref 32–45)

## 2023-11-03 LAB — CBC
HCT: 40.3 % (ref 36.0–46.0)
Hemoglobin: 11.9 g/dL — ABNORMAL LOW (ref 12.0–15.0)
MCH: 26.7 pg (ref 26.0–34.0)
MCHC: 29.5 g/dL — ABNORMAL LOW (ref 30.0–36.0)
MCV: 90.6 fL (ref 80.0–100.0)
Platelets: 212 10*3/uL (ref 150–400)
RBC: 4.45 MIL/uL (ref 3.87–5.11)
RDW: 15.9 % — ABNORMAL HIGH (ref 11.5–15.5)
WBC: 13.4 10*3/uL — ABNORMAL HIGH (ref 4.0–10.5)
nRBC: 0 % (ref 0.0–0.2)

## 2023-11-03 LAB — RESP PANEL BY RT-PCR (RSV, FLU A&B, COVID)  RVPGX2
Influenza A by PCR: NEGATIVE
Influenza B by PCR: NEGATIVE
Resp Syncytial Virus by PCR: NEGATIVE
SARS Coronavirus 2 by RT PCR: NEGATIVE

## 2023-11-03 LAB — I-STAT CG4 LACTIC ACID, ED: Lactic Acid, Venous: 1.1 mmol/L (ref 0.5–1.9)

## 2023-11-03 LAB — BRAIN NATRIURETIC PEPTIDE: B Natriuretic Peptide: 411.3 pg/mL — ABNORMAL HIGH (ref 0.0–100.0)

## 2023-11-03 MED ORDER — ATORVASTATIN CALCIUM 10 MG PO TABS
20.0000 mg | ORAL_TABLET | Freq: Every evening | ORAL | Status: DC
  Administered 2023-11-05 – 2023-11-15 (×11): 20 mg via ORAL
  Filled 2023-11-03 (×12): qty 2

## 2023-11-03 MED ORDER — ACETAMINOPHEN 325 MG PO TABS
650.0000 mg | ORAL_TABLET | Freq: Once | ORAL | Status: AC
  Administered 2023-11-03: 650 mg via ORAL
  Filled 2023-11-03: qty 2

## 2023-11-03 MED ORDER — FUROSEMIDE 10 MG/ML IJ SOLN
40.0000 mg | Freq: Once | INTRAMUSCULAR | Status: AC
  Administered 2023-11-03: 40 mg via INTRAVENOUS
  Filled 2023-11-03: qty 4

## 2023-11-03 MED ORDER — FLUTICASONE FUROATE-VILANTEROL 100-25 MCG/ACT IN AEPB
1.0000 | INHALATION_SPRAY | Freq: Every day | RESPIRATORY_TRACT | Status: DC
  Administered 2023-11-05 – 2023-11-07 (×2): 1 via RESPIRATORY_TRACT
  Filled 2023-11-03 (×2): qty 28

## 2023-11-03 MED ORDER — EMPAGLIFLOZIN 25 MG PO TABS
25.0000 mg | ORAL_TABLET | Freq: Every day | ORAL | Status: DC
  Administered 2023-11-04 – 2023-11-05 (×2): 25 mg via ORAL
  Filled 2023-11-03 (×4): qty 1

## 2023-11-03 MED ORDER — ROPINIROLE HCL 1 MG PO TABS: 0.5000 mg | ORAL_TABLET | Freq: Every day | ORAL | Status: DC

## 2023-11-03 MED ORDER — KETOROLAC TROMETHAMINE 15 MG/ML IJ SOLN
15.0000 mg | Freq: Once | INTRAMUSCULAR | Status: AC
  Administered 2023-11-03: 15 mg via INTRAVENOUS
  Filled 2023-11-03: qty 1

## 2023-11-03 MED ORDER — LEVOTHYROXINE SODIUM 75 MCG PO TABS
150.0000 ug | ORAL_TABLET | Freq: Every day | ORAL | Status: DC
Start: 2023-11-04 — End: 2023-11-16
  Administered 2023-11-05 – 2023-11-15 (×9): 150 ug via ORAL
  Filled 2023-11-03 (×10): qty 2

## 2023-11-03 MED ORDER — INSULIN ASPART 100 UNIT/ML IJ SOLN
0.0000 [IU] | Freq: Three times a day (TID) | INTRAMUSCULAR | Status: DC
  Administered 2023-11-04 (×2): 3 [IU] via SUBCUTANEOUS
  Administered 2023-11-05: 2 [IU] via SUBCUTANEOUS
  Administered 2023-11-05 – 2023-11-08 (×2): 3 [IU] via SUBCUTANEOUS
  Administered 2023-11-08: 5 [IU] via SUBCUTANEOUS
  Administered 2023-11-09: 3 [IU] via SUBCUTANEOUS
  Administered 2023-11-09 (×2): 5 [IU] via SUBCUTANEOUS
  Administered 2023-11-10 (×2): 3 [IU] via SUBCUTANEOUS
  Administered 2023-11-10: 5 [IU] via SUBCUTANEOUS
  Administered 2023-11-11 – 2023-11-13 (×6): 3 [IU] via SUBCUTANEOUS
  Administered 2023-11-13: 5 [IU] via SUBCUTANEOUS
  Administered 2023-11-13 – 2023-11-14 (×3): 3 [IU] via SUBCUTANEOUS
  Administered 2023-11-15 (×2): 5 [IU] via SUBCUTANEOUS

## 2023-11-03 MED ORDER — LORATADINE 10 MG PO TABS
10.0000 mg | ORAL_TABLET | Freq: Every day | ORAL | Status: DC
  Administered 2023-11-04 – 2023-11-15 (×12): 10 mg via ORAL
  Filled 2023-11-03 (×12): qty 1

## 2023-11-03 MED ORDER — MONTELUKAST SODIUM 10 MG PO TABS
10.0000 mg | ORAL_TABLET | Freq: Every day | ORAL | Status: DC
  Administered 2023-11-04 – 2023-11-15 (×12): 10 mg via ORAL
  Filled 2023-11-03 (×12): qty 1

## 2023-11-03 MED ORDER — CEFTRIAXONE SODIUM 1 G IJ SOLR
1.0000 g | Freq: Once | INTRAMUSCULAR | Status: AC
  Administered 2023-11-04: 1 g via INTRAVENOUS
  Filled 2023-11-03: qty 10

## 2023-11-03 MED ORDER — APIXABAN 5 MG PO TABS: 5.0000 mg | ORAL_TABLET | Freq: Two times a day (BID) | ORAL | Status: DC

## 2023-11-03 MED ORDER — SODIUM CHLORIDE 0.9 % IV SOLN
1.0000 g | Freq: Once | INTRAVENOUS | Status: AC
  Administered 2023-11-03: 1 g via INTRAVENOUS
  Filled 2023-11-03: qty 10

## 2023-11-03 MED ORDER — AMIODARONE HCL 200 MG PO TABS
200.0000 mg | ORAL_TABLET | Freq: Every day | ORAL | Status: DC
  Administered 2023-11-04 – 2023-11-06 (×3): 200 mg via ORAL
  Filled 2023-11-03 (×3): qty 1

## 2023-11-03 MED ORDER — METHYLPREDNISOLONE SODIUM SUCC 125 MG IJ SOLR
125.0000 mg | Freq: Once | INTRAMUSCULAR | Status: AC
  Administered 2023-11-03: 125 mg via INTRAVENOUS
  Filled 2023-11-03: qty 2

## 2023-11-03 MED ORDER — MAGNESIUM SULFATE 2 GM/50ML IV SOLN
2.0000 g | Freq: Once | INTRAVENOUS | Status: AC
  Administered 2023-11-03: 2 g via INTRAVENOUS
  Filled 2023-11-03: qty 50

## 2023-11-03 MED ORDER — FERROUS SULFATE 325 (65 FE) MG PO TABS
325.0000 mg | ORAL_TABLET | Freq: Every day | ORAL | Status: DC
Start: 2023-11-04 — End: 2023-11-16
  Administered 2023-11-04 – 2023-11-15 (×12): 325 mg via ORAL
  Filled 2023-11-03 (×12): qty 1

## 2023-11-03 MED ORDER — POTASSIUM CHLORIDE CRYS ER 20 MEQ PO TBCR: 40.0000 meq | EXTENDED_RELEASE_TABLET | Freq: Every day | ORAL | Status: DC

## 2023-11-03 MED ORDER — UMECLIDINIUM BROMIDE 62.5 MCG/ACT IN AEPB
1.0000 | INHALATION_SPRAY | Freq: Every day | RESPIRATORY_TRACT | Status: DC
  Administered 2023-11-05 – 2023-11-07 (×2): 1 via RESPIRATORY_TRACT
  Filled 2023-11-03 (×2): qty 7

## 2023-11-03 MED ORDER — SODIUM CHLORIDE 0.9 % IV SOLN
500.0000 mg | Freq: Once | INTRAVENOUS | Status: AC
  Administered 2023-11-03: 500 mg via INTRAVENOUS
  Filled 2023-11-03: qty 5

## 2023-11-03 MED ORDER — PANTOPRAZOLE SODIUM 40 MG PO TBEC
40.0000 mg | DELAYED_RELEASE_TABLET | Freq: Every day | ORAL | Status: DC
  Administered 2023-11-04 – 2023-11-15 (×12): 40 mg via ORAL
  Filled 2023-11-03 (×12): qty 1

## 2023-11-03 MED ORDER — BUSPIRONE HCL 5 MG PO TABS
7.5000 mg | ORAL_TABLET | Freq: Two times a day (BID) | ORAL | Status: DC
  Administered 2023-11-04 – 2023-11-15 (×23): 7.5 mg via ORAL
  Filled 2023-11-03 (×15): qty 2
  Filled 2023-11-03: qty 1
  Filled 2023-11-03: qty 2
  Filled 2023-11-03: qty 1
  Filled 2023-11-03 (×3): qty 2
  Filled 2023-11-03: qty 1
  Filled 2023-11-03: qty 2
  Filled 2023-11-03: qty 1
  Filled 2023-11-03 (×2): qty 2

## 2023-11-03 MED ORDER — BUPROPION HCL ER (SR) 100 MG PO TB12
100.0000 mg | ORAL_TABLET | Freq: Two times a day (BID) | ORAL | Status: DC
  Administered 2023-11-04 – 2023-11-15 (×23): 100 mg via ORAL
  Filled 2023-11-03 (×29): qty 1

## 2023-11-03 MED ORDER — SODIUM CHLORIDE 0.9 % IV SOLN
500.0000 mg | Freq: Once | INTRAVENOUS | Status: AC
  Administered 2023-11-04: 500 mg via INTRAVENOUS
  Filled 2023-11-03: qty 5

## 2023-11-03 NOTE — Subjective & Objective (Signed)
Patient presents with respiratory distress found patient without her oxygen usually on 2 L at least found to be satting 70% on room air started on nonrebreather improved to 94 She was initially short of breath but now improved no chest pain no fevers History of COPD and CHF but then was found to be febrile up to 101.1

## 2023-11-03 NOTE — ED Provider Notes (Signed)
[place] EMERGENCY DEPARTMENT AT [facility] Provider Note   CSN: [id number] Arrival date & time: 11/03/23  1910     History  Chief Complaint  Patient presents with   Respiratory Distress    Chelsea [name] is a 58 y.o. female with past medical history seen for morbid obesity, a flutter, hypertension, CHF, COPD who presents from [facility] health care center with shortness of breath, hypoxia.  Patient found without oxygen on, normally wears 2 L nasal cannula and CPAP.  Sats initially 70% on room air, placed on nonrebreather, improvement to 94%.  Denies any chest pain, does endorse some chills. HPI     Home Medications Prior to Admission medications   Medication Sig Start Date End Date Taking? Authorizing Provider  albuterol (PROVENTIL) (2.5 MG/3ML) 0.083% nebulizer solution Take 3 mLs (2.5 mg total) by nebulization every 6 (six) hours as needed for wheezing or shortness of breath (I50.32). 07/04/16   Oretha Milch, MD  ALPRAZolam Prudy Feeler) 0.5 MG tablet Take 0.5 mg by mouth 2 (two) times daily as needed for anxiety. 09/05/23   [provider]  amiodarone (PACERONE) 200 MG tablet Take 1 tablet (200 mg total) by mouth daily. 06/21/23   Alfredo Martinez, MD  ammonium lactate (LAC-HYDRIN) 12 % lotion Apply 1 Application topically at bedtime.    [provider]  atorvastatin (LIPITOR) 20 MG tablet Take 20 mg by mouth every evening.    [provider]  buPROPion ER (WELLBUTRIN SR) 100 MG 12 hr tablet Take 1 tablet (100 mg total) by mouth 2 (two) times daily. 06/08/23   Alfredo Martinez, MD  busPIRone (BUSPAR) 7.5 MG tablet Take 1 tablet (7.5 mg total) by mouth 2 (two) times daily. 10/07/23   Tiffany Kocher, DO  Cholecalciferol 125 MCG (5000 UT) TABS Take 1 tablet (5,000 Units total) by mouth daily. 07/10/22   [name], Werner Lean, MD  Dulaglutide (TRULICITY) 0.75 MG/0.5ML SOAJ Inject 0.75 mg into the skin every Saturday.    [provider]  ELIQUIS 5 MG  TABS tablet Take 5 mg by mouth 2 (two) times daily. 04/12/21   [provider]  empagliflozin (JARDIANCE) 25 MG TABS tablet Take 1 tablet (25 mg total) by mouth daily. 08/20/23   Celine Mans, MD  ferrous sulfate 325 (65 FE) MG EC tablet Take 325 mg by mouth daily.    [provider]  Fluticasone-Umeclidin-Vilant (TRELEGY ELLIPTA) 100-62.5-25 MCG/ACT AEPB Inhale 1 puff into the lungs daily. 10/04/23   Levin Erp, MD  insulin glargine (LANTUS) 100 UNIT/ML Solostar Pen Inject 35 Units into the skin in the morning. 10/04/23   Levin Erp, MD  insulin glargine-yfgn (SEMGLEE) 100 UNIT/ML injection Inject 0.4 mLs (40 Units total) into the skin at bedtime. 10/06/23   Alicia Amel, MD  levothyroxine (SYNTHROID) 150 MCG tablet Take 1 tablet (150 mcg total) by mouth daily at 6 (six) AM. Patient taking differently: Take 150 mcg by mouth daily. 07/10/22   [name], Werner Lean, MD  loperamide (IMODIUM) 2 MG capsule Take 2 mg by mouth every 4 (four) hours as needed for diarrhea or loose stools.    [provider]  loratadine (CLARITIN) 10 MG tablet Take 1 tablet (10 mg total) by mouth daily. 10/04/23   Levin Erp, MD  montelukast (SINGULAIR) 10 MG tablet Take 10 mg by mouth at bedtime.    [provider]  Multiple Vitamin (MULTIVITAMIN WITH MINERALS) TABS tablet Take 1 tablet by mouth daily.    [provider]  NON FORMULARY Apply BIPAP every night at bedtime.    [provider]  nystatin cream (MYCOSTATIN) Apply topically 2 (two) times daily. 06/24/23   Alfredo Martinez, MD  omeprazole (PRILOSEC) 20 MG capsule Take 20 mg by mouth daily. 10/02/22   [provider]  oxyCODONE-acetaminophen (PERCOCET) 10-325 MG tablet Take 1 tablet by mouth every 6 (six) hours. 06/21/23   Alfredo Martinez, MD  OXYGEN Inhale 5 L/min into the lungs in the morning and at bedtime. Connect to BIPAP qhs    [provider]  potassium chloride SA (KLOR-CON M) 20  MEQ tablet Take 2 tablets (40 mEq total) by mouth daily. 06/21/23   Alfredo Martinez, MD  rOPINIRole (REQUIP) 0.5 MG tablet Take 1 tablet (0.5 mg total) by mouth at bedtime. 06/21/23   Alfredo Martinez, MD  torsemide (DEMADEX) 20 MG tablet Take 60 mg by mouth 2 (two) times daily.    [provider]      Allergies    Iodinated contrast media, Penicillins, Insulin lispro, and Minoxidil    Review of Systems   Review of Systems  All other systems reviewed and are negative.   Physical Exam Updated Vital Signs BP (!) 97/57   Pulse 87   Temp (!) 101.1 F (38.4 C) (Oral)   Resp (!) 24   Ht 5\' 7"  (1.702 m)   Wt (!) 146.5 kg   SpO2 93%   BMI 50.59 kg/m  Physical Exam Vitals and nursing note reviewed.  Constitutional:      General: She is not in acute distress.    Appearance: Normal appearance. She is ill-appearing.  HENT:     Head: Normocephalic and atraumatic.  Eyes:     General:        Right eye: No discharge.        Left eye: No discharge.  Cardiovascular:     Rate and Rhythm: Normal rate and regular rhythm.     Heart sounds: No murmur heard.    No friction rub. No gallop.  Pulmonary:     Effort: Pulmonary effort is normal.     Breath sounds: Normal breath sounds.     Comments: Shallow respiratory effort, increased work of breathing, no significant wheezing, rhonchi on exam.  She has hypoventilation on exam and historically.  Tachypneic respiratory rate. Abdominal:     General: Bowel sounds are normal.     Palpations: Abdomen is soft.  Musculoskeletal:     Comments: 2+ Bilateral lower extremity edema.  Skin:    General: Skin is warm and dry.     Capillary Refill: Capillary refill takes less than 2 seconds.  Neurological:     Mental Status: She is alert and oriented to person, place, and time.  Psychiatric:        Mood and Affect: Mood normal.        Behavior: Behavior normal.     ED Results / Procedures / Treatments   Labs (all labs ordered are listed, but  only abnormal results are displayed) Labs Reviewed  CBC - Abnormal; Notable for the following components:      Result Value   WBC 13.4 (*)    Hemoglobin 11.9 (*)    MCHC 29.5 (*)    RDW 15.9 (*)    All other components within normal limits  BASIC METABOLIC PANEL - Abnormal; Notable for the following components:   Chloride 92 (*)    Glucose, Bld 218 (*)    BUN 27 (*)  Calcium 8.3 (*)    All other components within normal limits  BRAIN NATRIURETIC PEPTIDE - Abnormal; Notable for the following components:   B Natriuretic Peptide 411.3 (*)    All other components within normal limits  I-STAT VENOUS BLOOD GAS, ED - Abnormal; Notable for the following components:   pCO2, Ven 65.1 (*)    pO2, Ven 46 (*)    Bicarbonate 39.8 (*)    TCO2 42 (*)    Acid-Base Excess 12.0 (*)    Calcium, Ion 1.05 (*)    All other components within normal limits  RESP PANEL BY RT-PCR (RSV, FLU A&B, COVID)  [id number]  I-STAT CG4 LACTIC ACID, ED  I-STAT CG4 LACTIC ACID, ED    EKG None  Radiology DG Chest Portable 1 View Result Date: 11/03/2023 CLINICAL DATA:  Shortness of breath EXAM: PORTABLE CHEST 1 VIEW COMPARISON:  10/01/2023, 07/29/2022 FINDINGS: Enlarged cardiomediastinal silhouette. Heterogeneous bilateral opacities right greater than left. No pleural effusion or pneumothorax. Aortic atherosclerosis IMPRESSION: Enlarged cardiomediastinal silhouette with central congestion. Heterogeneous bilateral opacities right greater than left, the suspect infection over edema. Electronically Signed   By: Jasmine Pang M.D.   On: 11/03/2023 20:27    Procedures .Critical Care  Performed by: Olene Floss, PA-C Authorized by: Olene Floss, PA-C   Critical care provider statement:    Critical care time (minutes):  35   Critical care was necessary to treat or prevent imminent or life-threatening deterioration of the following conditions:  Respiratory failure   Critical care was time spent  personally by me on the following activities:  Development of treatment plan with patient or surrogate, discussions with consultants, evaluation of patient's response to treatment, examination of patient, ordering and review of laboratory studies, ordering and review of radiographic studies, ordering and performing treatments and interventions, pulse oximetry, re-evaluation of patient's condition and review of old charts   Care discussed with: admitting provider       Medications Ordered in ED Medications  azithromycin (ZITHROMAX) 500 mg in sodium chloride 0.9 % 250 mL IVPB (500 mg Intravenous New Bag/Given 11/03/23 2144)  cefTRIAXone (ROCEPHIN) 1 g in sodium chloride 0.9 % 100 mL IVPB (0 g Intravenous Stopped 11/03/23 2138)  methylPREDNISolone sodium succinate (SOLU-MEDROL) 125 mg/2 mL injection 125 mg (125 mg Intravenous Given 11/03/23 2043)  magnesium sulfate IVPB 2 g 50 mL (0 g Intravenous Stopped 11/03/23 2138)  furosemide (LASIX) injection 40 mg (40 mg Intravenous Given 11/03/23 2138)  ketorolac (TORADOL) 15 MG/ML injection 15 mg (15 mg Intravenous Given 11/03/23 2138)  acetaminophen (TYLENOL) tablet 650 mg (650 mg Oral Given 11/03/23 2156)    ED Course/ Medical Decision Making/ A&P                                 Medical Decision Making Amount and/or Complexity of Data Reviewed Labs: ordered. Radiology: ordered.  Risk OTC drugs. Prescription drug management.   This patient is a 58 y.o. female who presents to the ED for concern of shob, this involves an extensive number of treatment options, and is a complaint that carries with it a high risk of complications and morbidity. The emergent differential diagnosis prior to evaluation includes, but is not limited to,  asthma exacerbation, COPD exacerbation, acute upper respiratory infection, acute bronchitis, chronic bronchitis, interstitial lung disease, ARDS, PE, pneumonia, atypical ACS, carbon monoxide poisoning, spontaneous pneumothorax, new CHF  vs CHF exacerbation, versus other .  This is not an exhaustive differential.   Past Medical History / Co-morbidities / Social History: morbid obesity, a flutter, hypertension, CHF, COPD   Additional history: Chart reviewed. Pertinent results include: Extensively reviewed lab work, imaging from recent previous hospital admissions, she is admitted somewhat frequently for respiratory failure due to her chronic conditions.,  Most recently 1 month ago.  Physical Exam: Physical exam performed. The pertinent findings include: Shallow respiratory effort, increased work of breathing, no significant wheezing, rhonchi on exam.  She has hypoventilation on exam and historically.  Tachypneic respiratory rate.  2+ Bilateral lower extremity edema.  Lab Tests: I ordered, and personally interpreted labs.  The pertinent results include: VBG notable for hypercarbia, 65.1 pCO2, BMP notable for hyperglycemia, glucose 218, no anion gap, her BNP is notable for 411.3, consistent with mild CHF exacerbation.  RSV, COVID, flu swab negative.  CBC notable for leukocytosis, blood cell 13.4.  Mild anemia, hemoglobin 11.9.  Initial lactic acid 1.1.   Imaging Studies: I ordered imaging studies including plain film chest x-ray. I independently visualized and interpreted imaging which showed heterogenous bilateral opacities right greater than left suspicious for developing pneumonia.. I agree with the radiologist interpretation.   Cardiac Monitoring:  The patient was maintained on a cardiac monitor.  My attending physician Dr. Anitra Lauth viewed and interpreted the cardiac monitored which showed an underlying rhythm of: NSR, no significant change from baseline. I agree with this interpretation.   Medications: I ordered medication including Rocephin, azithromycin for pneumonia, Solu-Medrol, magnesium sulfate, Lasix, for shortness of breath, pneumonia, heart failure exacerbation, Toradol for pain, Tylenol for fever  Consultations  Obtained: I requested consultation with the family medicine practice,  and discussed lab and imaging findings as well as pertinent plan - they recommend: Admission for acute hypoxic respiratory failure in the setting of pneumonia   Disposition: After consideration of the diagnostic results and the patients response to treatment, I feel that patient would benefit from admission for acute hypoxic respiratory failure, pneumonia as discussed above.   I discussed this case with my attending physician Dr. Anitra Lauth who cosigned this note including patient's presenting symptoms, physical exam, and planned diagnostics and interventions. Attending physician stated agreement with plan or made changes to plan which were implemented.    Final Clinical Impression(s) / ED Diagnoses Final diagnoses:  COPD exacerbation (HCC)  Acute respiratory failure with hypoxia (HCC)  Acute on chronic congestive heart failure, unspecified heart failure type (HCC)  HCAP (healthcare-associated pneumonia)    Rx / DC Orders ED Discharge Orders     None         Olene Floss, PA-C 11/03/23 2240    Gwyneth Sprout, MD 11/03/23 2356

## 2023-11-03 NOTE — ED Triage Notes (Addendum)
Pt arrived from [facility], BIB [facility] for Resp Distress. Saff reported found pt w/o her oxygen on, usually wears 2L [place] (also C-PAP) ,unsure how long she had been w/o her supplemental oxygen. Sats initially 70s on RA, placed on NRB, EMS reports on their arrival sats were 94% NRB. In route pt's sast stated at 94% on the NRB, pt reports SOB has improved, denies CP, fevers, or URI. Pt alert on arrival, answering questions, GCS 15. Lungs clear bilaterally, denies cough.  Hx of COPD, CHF, & Hypoxia. Febrile 101.1  Admitted for sepsis 2 weeks ago  EMS vitals 114/74 80-90 HR, NSR 94% NRB

## 2023-11-03 NOTE — Assessment & Plan Note (Signed)
Patient somnelent on exam, but arousable and able to respond to questions appropriately. VBG showing pCO2 on 65 with bicarb 39. Differential broad at this time. Etiology likely multifactorial complicated by patients minimal respiratory reserve due to OSA/OHS. Considering HCAP, aspiration pneumonitis vs CHF exacerbation. Patient is s/p 40mg  IV lasix in ED. Also treated with ceftriaxone and azithromycin x1 in ED.   - Admit to [facility], attending Dr. Manson Passey, Med Surg - Continuous pulse ox   - Repeat VBG - Strict I/Os - Consider additional lasix if appears volume overloaded  - continue BiPAP, wean as tolerated to O2goal of 88-92%  - continue azithromycin and ceftriaxone, consider broadening for MRSA and pseudomonas coverage if continues to fever - NPO given somnolence and BiPAP  - Did not order oxycodone given somnolence, consider restarting once mental status improves - AM CBC, BMP

## 2023-11-03 NOTE — H&P (Incomplete)
Chelsea [name] GEX:[id number] DOB: 02-13-1966 DOA: 11/03/2023     PCP: Oneita Hurt, No   Outpatient Specialists: * NONE CARDS: * Dr. Donato Schultz, MD  NEphrology: *  Dr. No care team member to display  NEurology *   Dr. Pulmonary *  Dr.  Oncology * Dr.No care team member to display  GI* Dr.  Deboraha Sprang, [facility]) No care team member to display Urology Dr. *  Patient arrived to ER on 11/03/23 at 1910 Referred by Attending Gwyneth Sprout, MD   Patient coming from:    home Lives alone,   *** With family From facility ***    Chief Complaint: *** Chief Complaint  Patient presents with   Respiratory Distress    HPI: Chelsea LEATH is a 58 y.o. female with medical history significant of ***     Presented with   * Patient presents with respiratory distress found patient without her oxygen usually on 2 L at least found to be satting 70% on room air started on nonrebreather improved to 94 She was initially short of breath but now improved no chest pain no fevers History of COPD and CHF but then was found to be febrile up to 101.1      Denies significant ETOH intake *** Does not smoke*** but interested in quitting***  Lab Results  Component Value Date   SARSCOV2NAA NEGATIVE 11/03/2023   SARSCOV2NAA NEGATIVE 06/14/2023   SARSCOV2NAA NEGATIVE 06/14/2023   SARSCOV2NAA NEGATIVE 12/28/2022        Regarding pertinent Chronic problems: ***  ****Hyperlipidemia - *on statins {statin:315258}  Lipid Panel     Component Value Date/Time   CHOL 144 05/31/2023 1025   TRIG 197 (H) 05/31/2023 1025   HDL 29 (L) 05/31/2023 1025   CHOLHDL 5.0 05/31/2023 1025   VLDL 39 05/31/2023 1025   LDLCALC 76 05/31/2023 1025    ***HTN on   ***chronic CHF diastolic/systolic/ combined - last echo*** Recent Results (from the past 01027 hours)  ECHOCARDIOGRAM COMPLETE   Collection Time: 06/01/23 11:43 AM  Result Value   Weight 5,453.3   BP 102/56   S' Lateral 3.90   AR max vel 1.64   AV  Area VTI 1.59   AV Mean grad 18.4   AV Peak grad 31.9   Ao pk vel 2.83   Area-P 1/2 4.80   MV M vel 1.91   AV Area mean vel 1.75   MV Peak grad 14.6   Est EF 60 - 65%   Narrative      ECHOCARDIOGRAM REPORT       Patient Name:   Chelsea [name] Date of Exam: 06/01/2023 Medical Rec #:  [id number]            Height:       65.0 in Accession #:    [id number]           Weight:       340.8 lb Date of Birth:  08-Feb-1966            BSA:          2.481 m Patient Age:    57 years             BP:           99/74 mmHg Patient Gender: F                    HR:           75  bpm. Exam Location:  Inpatient  Procedure: 2D Echo, Cardiac Doppler and Color Doppler  Indications:    Aortic Stenosis I35.0   History:        Patient has prior history of Echocardiogram examinations, most                 recent 07/30/2022. CHF, CAD, COPD, Arrythmias:Atrial                 Fibrillation and Atrial Flutter, Signs/Symptoms:Dyspnea; Risk                 Factors:Hypertension, Diabetes, Dyslipidemia and Sleep Apnea.                 Migraine.   Sonographer:    Lucendia Herrlich Referring Phys: [id number] PAULA V ROSS  IMPRESSIONS    1. Left ventricular ejection fraction, by estimation, is 60 to 65%. The left ventricle has normal function. The left ventricle has no regional wall motion abnormalities. Left ventricular diastolic parameters were normal.  2. Right ventricular systolic function is normal. The right ventricular size is normal.  3. Left atrial size was mild to moderately dilated.  4. The mitral valve is degenerative. No evidence of mitral valve regurgitation.  5. The aortic valve is calcified. Aortic valve regurgitation is trivial. Moderate aortic valve stenosis.  6. The inferior vena cava is dilated in size with >50% respiratory variability, suggesting right atrial pressure of 8 mmHg.  Comparison(s): No significant change from prior study.  FINDINGS  Left Ventricle: Left ventricular ejection  fraction, by estimation, is 60 to 65%. The left ventricle has normal function. The left ventricle has no regional wall motion abnormalities. The left ventricular internal cavity size was normal in size. There is  no left ventricular hypertrophy. Left ventricular diastolic parameters were normal.  Right Ventricle: The right ventricular size is normal. Right ventricular systolic function is normal.  Left Atrium: Left atrial size was mild to moderately dilated.  Right Atrium: Right atrial size was normal in size.  Pericardium: There is no evidence of pericardial effusion.  Mitral Valve: The mitral valve is degenerative in appearance. No evidence of mitral valve regurgitation.  Tricuspid Valve: Tricuspid valve regurgitation is not demonstrated.  Aortic Valve: The aortic valve is calcified. Aortic valve regurgitation is trivial. Moderate aortic stenosis is present. Aortic valve mean gradient measures 18.4 mmHg. Aortic valve peak gradient measures 31.9 mmHg. Aortic valve area, by VTI measures 1.59  cm.  Pulmonic Valve: Pulmonic valve regurgitation is not visualized.  Aorta: The aortic root and ascending aorta are structurally normal, with no evidence of dilitation.  Venous: The inferior vena cava is dilated in size with greater than 50% respiratory variability, suggesting right atrial pressure of 8 mmHg.  IAS/Shunts: The interatrial septum was not well visualized.    LEFT VENTRICLE PLAX 2D LVIDd:         5.70 cm   Diastology LVIDs:         3.90 cm   LV e' medial:    10.30 cm/s LV PW:         0.90 cm   LV E/e' medial:  10.3 LV IVS:        1.00 cm   LV e' lateral:   10.90 cm/s LVOT diam:     2.20 cm   LV E/e' lateral: 9.7 LV SV:         100 LV SV Index:   40 LVOT Area:     3.80 cm  RIGHT VENTRICLE             IVC RV S prime:     12.70 cm/s  IVC diam: 3.20 cm TAPSE (M-mode): 1.8 cm  LEFT ATRIUM              Index        RIGHT ATRIUM           Index LA diam:        4.10 cm  1.65  cm/m   RA Area:     18.20 cm LA Vol (A2C):   96.8 ml  39.01 ml/m  RA Volume:   51.70 ml  20.84 ml/m LA Vol (A4C):   103.0 ml 41.51 ml/m LA Biplane Vol: 102.0 ml 41.11 ml/m  AORTIC VALVE                     PULMONIC VALVE AV Area (Vmax):    1.64 cm      PR End Diast Vel: 5.57 msec AV Area (Vmean):   1.75 cm AV Area (VTI):     1.59 cm AV Vmax:           282.60 cm/s AV Vmean:          186.600 cm/s AV VTI:            0.629 m AV Peak Grad:      31.9 mmHg AV Mean Grad:      18.4 mmHg LVOT Vmax:         122.00 cm/s LVOT Vmean:        85.700 cm/s LVOT VTI:          0.264 m LVOT/AV VTI ratio: 0.42   AORTA Ao Root diam: 3.10 cm Ao Asc diam:  3.60 cm  MITRAL VALVE                TRICUSPID VALVE MV Area (PHT): 4.80 cm     TR Peak grad:   21.5 mmHg MV Decel Time: 158 msec     TR Vmax:        232.00 cm/s MR Peak grad: 14.6 mmHg MR Vmax:      191.00 cm/s   SHUNTS MV E velocity: 106.00 cm/s  Systemic VTI:  0.26 m MV A velocity: 89.60 cm/s   Systemic Diam: 2.20 cm MV E/A ratio:  1.18  Photographer signed by Carolan Clines Signature Date/Time: 06/01/2023/12:26:24 PM       Final     *Note: Due to a large number of results and/or encounters for the requested time period, some results have not been displayed. A complete set of results can be found in Results Review.    *** CAD  - On Aspirin, statin, betablocker, Plavix                 - *followed by cardiology                - last cardiac cath       ***DM 2 -  Lab Results  Component Value Date   HGBA1C 9.3 (H) 08/16/2023   ****on insulin, PO meds only, diet controlled  ***Hypothyroidism:   Lab Results  Component Value Date   TSH 1.562 09/29/2023   on synthroid  *** Morbid obesity-   BMI Readings from Last 1 Encounters:  11/03/23 50.59 kg/m     *** Asthma -well *** controlled on home inhalers/ nebs                     ***  COPD - not **followed by pulmonology *** not  on baseline oxygen  *L,    ***  OSA -on nocturnal oxygen, *CPAP, *noncompliant with CPAP  *** Hx of CVA - *with/out residual deficits on Aspirin 81 mg, 325, Plavix  ***A. Fib -   atrial fibrillation CHA2DS2 vas score **** CHA2DS2/VAS Stroke Risk Points  Current as of 3 minutes ago     5 >= 2 Points: High Risk  1 to 1.99 Points: Medium Risk  0 Points: Low Risk    Last Change: N/A      Details    This score determines the patient's risk of having a stroke if the  patient has atrial fibrillation.       Points Metrics  1 Has Congestive Heart Failure:  Yes    Current as of 3 minutes ago  1 Has Vascular Disease:  Yes    Current as of 3 minutes ago  1 Has Hypertension:  Yes    Current as of 3 minutes ago  0 Age:  65    Current as of 3 minutes ago  1 Has Diabetes Excluding Gestational Diabetes:  Yes    Current as of 3 minutes ago  0 Had Stroke:  No  Had TIA:  No  Had Thromboembolism:  No    Current as of 3 minutes ago  1 Female:  Yes    Current as of 3 minutes ago           current  on anticoagulation with ****Coumadin  ***Xarelto,* Eliquis,  *** Not on anticoagulation secondary to Risk of Falls, *** recurrent bleeding         -  Rate control:  Currently controlled with ***Toprolol,  *Metoprolol,* Diltiazem, *Coreg          - Rhythm control: *** amiodarone, *flecainide  ***Hx of DVT/PE on - anticoagulation with ****Coumadin  ***Xarelto,* Eliquis,     ***CKD stage III*-   baseline Cr **** Estimated Creatinine Clearance: 97.3 mL/min (by C-G formula based on SCr of 0.94 mg/dL).  Lab Results  Component Value Date   CREATININE 0.94 11/03/2023   CREATININE 0.88 10/04/2023   CREATININE 0.78 10/03/2023   Lab Results  Component Value Date   NA 137 11/03/2023   CL 92 (L) 11/03/2023   K 4.2 11/03/2023   CO2 31 11/03/2023   BUN 27 (H) 11/03/2023   CREATININE 0.94 11/03/2023   GFRNONAA >60 11/03/2023   CALCIUM 8.3 (L) 11/03/2023   PHOS 4.5 10/04/2023   ALBUMIN 3.0 (L) 09/29/2023   GLUCOSE 218 (H)  11/03/2023    **** Liver disease MELD 3.0: 12 at 09/21/2023  4:17 AM MELD-Na: 11 at 09/21/2023  4:17 AM Calculated from: Serum Creatinine: 0.8 mg/dL (Using min of 1 mg/dL) at 16/07/9603  5:40 AM Serum Sodium: 139 mmol/L (Using max of 137 mmol/L) at 09/21/2023  4:17 AM Total Bilirubin: 0.6 mg/dL (Using min of 1 mg/dL) at 98/08/9146  8:29 PM Serum Albumin: 2.8 g/dL at 56/21/3086  5:78 PM INR(ratio): 1.5 at 09/19/2023  5:53 PM Age at listing (hypothetical): 57 years Sex: Female at 09/21/2023  4:17 AM  Hepatic Function Panel     Component Value Date/Time   PROT 7.6 09/29/2023 1300   ALBUMIN 3.0 (L) 09/29/2023 1300   AST 50 (H) 09/29/2023 1300   ALT 24 09/29/2023 1300   ALKPHOS 121 09/29/2023 1300   BILITOT 0.4 09/29/2023 1300   BILIDIR 0.2 05/28/2023 1345   IBILI 0.8 05/28/2023 1345  1.5  ***BPH - on Flomax, Proscar    *** Dementia - on Aricept** Nemenda  *** Chronic anemia - baseline hg Hemoglobin & Hematocrit  Recent Labs    10/04/23 0500 11/03/23 1951 11/03/23 1955  HGB 11.4* 11.9* 13.6   Iron/TIBC/Ferritin/ %Sat    Component Value Date/Time   IRON 13 (L) 05/30/2023 0340   TIBC 392 05/30/2023 0340   FERRITIN 29 05/30/2023 0340   IRONPCTSAT 3 (L) 05/30/2023 0340     Seizure DO - las seizure *** currently on     Cancer:     While in ER:         Lab Orders         Resp panel by RT-PCR (RSV, Flu A&B, Covid) Anterior Nasal Swab         CBC         Basic metabolic panel         Brain natriuretic peptide         I-Stat venous blood gas, ([facility], [facility], [facility])         I-Stat CG4 Lactic Acid      CT HEAD *** NON acute   MRI brain  ***no acute CVA  CXR - ***NON acute  CTabd/pelvis - ***nonacute  CTA chest - ***nonacute, no PE, * no evidence of infiltrate  Following Medications were ordered in ER: Medications  azithromycin (ZITHROMAX) 500 mg in sodium chloride 0.9 % 250 mL IVPB (500 mg Intravenous New Bag/Given 11/03/23 2144)  cefTRIAXone (ROCEPHIN)  1 g in sodium chloride 0.9 % 100 mL IVPB (0 g Intravenous Stopped 11/03/23 2138)  methylPREDNISolone sodium succinate (SOLU-MEDROL) 125 mg/2 mL injection 125 mg (125 mg Intravenous Given 11/03/23 2043)  magnesium sulfate IVPB 2 g 50 mL (0 g Intravenous Stopped 11/03/23 2138)  furosemide (LASIX) injection 40 mg (40 mg Intravenous Given 11/03/23 2138)  ketorolac (TORADOL) 15 MG/ML injection 15 mg (15 mg Intravenous Given 11/03/23 2138)  acetaminophen (TYLENOL) tablet 650 mg (650 mg Oral Given 11/03/23 2156)    _______________________________________________________ ER Provider Called:       DrMarland Kitchen  They Recommend admit to medicine *** Will see in AM  ***SEEN in ER     ED Triage Vitals  Encounter Vitals Group     BP 11/03/23 1931 118/72     Systolic BP Percentile --      Diastolic BP Percentile --      Pulse Rate 11/03/23 1931 93     Resp 11/03/23 1931 (!) 33     Temp 11/03/23 1928 (!) 101.1 F (38.4 C)     Temp Source 11/03/23 1928 Oral     SpO2 11/03/23 1920 94 %     Weight 11/03/23 1922 (!) 323 lb (146.5 kg)     Height 11/03/23 1922 5\' 7"  (1.702 m)     Head Circumference --      Peak Flow --      Pain Score 11/03/23 1920 0     Pain Loc --      Pain Education --      Exclude from Growth Chart --   UUVO(53)@     _________________________________________ Significant initial  Findings: Abnormal Labs Reviewed  CBC - Abnormal; Notable for the following components:      Result Value   WBC 13.4 (*)    Hemoglobin 11.9 (*)    MCHC 29.5 (*)    RDW 15.9 (*)    All other components within normal limits  BASIC METABOLIC  PANEL - Abnormal; Notable for the following components:   Chloride 92 (*)    Glucose, Bld 218 (*)    BUN 27 (*)    Calcium 8.3 (*)    All other components within normal limits  BRAIN NATRIURETIC PEPTIDE - Abnormal; Notable for the following components:   B Natriuretic Peptide 411.3 (*)    All other components within normal limits  I-STAT VENOUS BLOOD GAS, ED - Abnormal;  Notable for the following components:   pCO2, Ven 65.1 (*)    pO2, Ven 46 (*)    Bicarbonate 39.8 (*)    TCO2 42 (*)    Acid-Base Excess 12.0 (*)    Calcium, Ion 1.05 (*)    All other components within normal limits      _________________________ Troponin ***ordered Cardiac Panel (last 3 results) No results for input(s): "CKTOTAL", "CKMB", "TROPONINIHS", "RELINDX" in the last 72 hours.   ECG: Ordered Personally reviewed and interpreted by me showing: HR : *** Rhythm: *NSR, Sinus tachycardia * A.fib. W RVR, RBBB, LBBB, Paced Ischemic changes*nonspecific changes, no evidence of ischemic changes QTC*  BNP (last 3 results) Recent Labs    09/19/23 1753 09/29/23 1458 11/03/23 1951  BNP 236.6* 185.1* 411.3*     COVID-19 Labs  No results for input(s): "DDIMER", "FERRITIN", "LDH", "CRP" in the last 72 hours.  Lab Results  Component Value Date   SARSCOV2NAA NEGATIVE 11/03/2023   SARSCOV2NAA NEGATIVE 06/14/2023   SARSCOV2NAA NEGATIVE 06/14/2023   SARSCOV2NAA NEGATIVE 12/28/2022       ____________________ This patient meets SIRS Criteria and may be septic. SIRS = Systemic Inflammatory Response Syndrome  Order a lactic acid level if needed AND/OR Initiate the sepsis protocol with the attached order set OR Click "Treating Associated Infection or Illness" if the patient is being treated for an infection that is a known cause of these abnormalities     The recent clinical data is shown below. Vitals:   11/03/23 1929 11/03/23 1931 11/03/23 2015 11/03/23 2045  BP:  118/72 98/70 92/60   Pulse:  93 91 88  Resp:  (!) 33 (!) 30 (!) 31  Temp:      TempSrc:      SpO2: 90% 92% 93% 94%  Weight:      Height:            WBC     Component Value Date/Time   WBC 13.4 (H) 11/03/2023 1951   LYMPHSABS 1.2 09/29/2023 1300   MONOABS 0.7 09/29/2023 1300   EOSABS 0.1 09/29/2023 1300   BASOSABS 0.1 09/29/2023 1300        Lactic Acid, Venous    Component Value Date/Time    LATICACIDVEN 1.1 11/03/2023 1956      Lactic Acid, Venous    Component Value Date/Time   LATICACIDVEN 1.1 11/03/2023 1956    Procalcitonin *** Ordered      UA *** no evidence of UTI  ***Pending ***not ordered   Urine analysis:    Component Value Date/Time   COLORURINE YELLOW 09/29/2023 1934   APPEARANCEUR CLEAR 09/29/2023 1934   LABSPEC 1.013 09/29/2023 1934   PHURINE 5.0 09/29/2023 1934   GLUCOSEU >=500 (A) 09/29/2023 1934   HGBUR NEGATIVE 09/29/2023 1934   BILIRUBINUR NEGATIVE 09/29/2023 1934   KETONESUR NEGATIVE 09/29/2023 1934   PROTEINUR 30 (A) 09/29/2023 1934   UROBILINOGEN 0.2 12/08/2010 0545   NITRITE NEGATIVE 09/29/2023 1934   LEUKOCYTESUR NEGATIVE 09/29/2023 1934    Results for orders placed or performed during the  hospital encounter of 11/03/23  Resp panel by RT-PCR (RSV, Flu A&B, Covid) Anterior Nasal Swab     Status: None   Collection Time: 11/03/23  7:51 PM   Specimen: Anterior Nasal Swab  Result Value Ref Range Status   SARS Coronavirus 2 by RT PCR NEGATIVE NEGATIVE Final   Influenza A by PCR NEGATIVE NEGATIVE Final   Influenza B by PCR NEGATIVE NEGATIVE Final    Comment: (NOTE) The Xpert Xpress SARS-CoV-2/FLU/RSV plus assay is intended as an aid in the diagnosis of influenza from Nasopharyngeal swab specimens and should not be used as a sole basis for treatment. Nasal washings and aspirates are unacceptable for Xpert Xpress SARS-CoV-2/FLU/RSV testing.  Fact Sheet for Patients: BloggerCourse.com  Fact Sheet for Healthcare Providers: SeriousBroker.it  This test is not yet approved or cleared by the [place] FDA and has been authorized for detection and/or diagnosis of SARS-CoV-2 by FDA under an Emergency Use Authorization (EUA). This EUA will remain in effect (meaning this test can be used) for the duration of the COVID-19 declaration under Section 564(b)(1) of the Act, 21 U.S.C. section  360bbb-3(b)(1), unless the authorization is terminated or revoked.     Resp Syncytial Virus by PCR NEGATIVE NEGATIVE Final    Comment: (NOTE) Fact Sheet for Patients: BloggerCourse.com  Fact Sheet for Healthcare Providers: SeriousBroker.it  This test is not yet approved or cleared by the [place] FDA and has been authorized for detection and/or diagnosis of SARS-CoV-2 by FDA under an Emergency Use Authorization (EUA). This EUA will remain in effect (meaning this test can be used) for the duration of the COVID-19 declaration under Section 564(b)(1) of the Act, 21 U.S.C. section 360bbb-3(b)(1), unless the authorization is terminated or revoked.  Performed at [facility], [address], [place], [place] [address]     ABX started Antibiotics Given (last 72 hours)     Date/Time Action Medication Dose Rate   11/03/23 2049 New Bag/Given   cefTRIAXone (ROCEPHIN) 1 g in sodium chloride 0.9 % 100 mL IVPB 1 g 200 mL/hr   11/03/23 2144 New Bag/Given   azithromycin (ZITHROMAX) 500 mg in sodium chloride 0.9 % 250 mL IVPB 500 mg 250 mL/hr       No results found for the last 90 days.     ________________________________________________________________  Arterial ***Venous  Blood Gas result:  pH *** pCO2 ***; pO2 ***;     %O2 Sat ***.  ABG    Component Value Date/Time   PHART 7.349 (L) 08/15/2023 1402   PCO2ART 96.6 (HH) 08/15/2023 1402   PO2ART 54 (L) 08/15/2023 1402   HCO3 39.8 (H) 11/03/2023 1955   TCO2 42 (H) 11/03/2023 1955   ACIDBASEDEF 7.9 (H) 09/20/2023 2156   O2SAT 79 11/03/2023 1955       __________________________________________________________ Recent Labs  Lab 11/03/23 1951 11/03/23 1955  NA 136 137  K 4.2 4.2  CO2 31  --   GLUCOSE 218*  --   BUN 27*  --   CREATININE 0.94  --   CALCIUM 8.3*  --     Cr  * stable,  Up from baseline see below Lab Results  Component Value Date    CREATININE 0.94 11/03/2023   CREATININE 0.88 10/04/2023   CREATININE 0.78 10/03/2023    No results for input(s): "AST", "ALT", "ALKPHOS", "BILITOT", "PROT", "ALBUMIN" in the last 168 hours. Lab Results  Component Value Date   CALCIUM 8.3 (L) 11/03/2023   PHOS 4.5 10/04/2023  Plt: Lab Results  Component Value Date   PLT 212 11/03/2023         Recent Labs  Lab 11/03/23 1951 11/03/23 1955  WBC 13.4*  --   HGB 11.9* 13.6  HCT 40.3 40.0  MCV 90.6  --   PLT 212  --     HG/HCT * stable,  Down *Up from baseline see below    Component Value Date/Time   HGB 13.6 11/03/2023 1955   HCT 40.0 11/03/2023 1955   MCV 90.6 11/03/2023 1951      No results for input(s): "LIPASE", "AMYLASE" in the last 168 hours. No results for input(s): "AMMONIA" in the last 168 hours.    .lab  _______________________________________________ Hospitalist was called for admission for *** There are no diagnoses linked to this encounter.   The following Work up has been ordered so far:  Orders Placed This Encounter  Procedures   Resp panel by RT-PCR (RSV, Flu A&B, Covid) Anterior Nasal Swab   DG Chest Portable 1 View   CBC   Basic metabolic panel   Brain natriuretic peptide   Consult to hospitalist   Bipap   I-Stat venous blood gas, ([facility], [facility], [facility])   I-Stat CG4 Lactic Acid   ED EKG   EKG 12-Lead     OTHER Significant initial  Findings:  labs showing:     DM  labs:  HbA1C: Recent Labs    05/29/23 1143 08/16/23 0419  HGBA1C 8.5* 9.3*       CBG (last 3)  No results for input(s): "GLUCAP" in the last 72 hours.        Cultures:    Component Value Date/Time   SDES TRACHEAL ASPIRATE 09/30/2023 1056   SPECREQUEST NONE 09/30/2023 1056   CULT  09/30/2023 1056    Normal respiratory flora-no Staph aureus or Pseudomonas seen Performed at [facility], [address], [place], [place] [address]    REPTSTATUS 10/02/2023 FINAL 09/30/2023 1056     Radiological  Exams on Admission: DG Chest Portable 1 View Result Date: 11/03/2023 CLINICAL DATA:  Shortness of breath EXAM: PORTABLE CHEST 1 VIEW COMPARISON:  10/01/2023, 07/29/2022 FINDINGS: Enlarged cardiomediastinal silhouette. Heterogeneous bilateral opacities right greater than left. No pleural effusion or pneumothorax. Aortic atherosclerosis IMPRESSION: Enlarged cardiomediastinal silhouette with central congestion. Heterogeneous bilateral opacities right greater than left, the suspect infection over edema. Electronically Signed   By: Jasmine Pang M.D.   On: 11/03/2023 20:27   _______________________________________________________________________________________________________ Latest  Blood pressure 92/60, pulse 88, temperature (!) 101.1 F (38.4 C), temperature source Oral, resp. rate (!) 31, height 5\' 7"  (1.702 m), weight (!) 146.5 kg, SpO2 94%.   Vitals  labs and radiology finding personally reviewed  Review of Systems:    Pertinent positives include: ***  Constitutional:  No weight loss, night sweats, Fevers, chills, fatigue, weight loss  HEENT:  No headaches, Difficulty swallowing,Tooth/dental problems,Sore throat,  No sneezing, itching, ear ache, nasal congestion, post nasal drip,  Cardio-vascular:  No chest pain, Orthopnea, PND, anasarca, dizziness, palpitations.no Bilateral lower extremity swelling  GI:  No heartburn, indigestion, abdominal pain, nausea, vomiting, diarrhea, change in bowel habits, loss of appetite, melena, blood in stool, hematemesis Resp:  no shortness of breath at rest. No dyspnea on exertion, No excess mucus, no productive cough, No non-productive cough, No coughing up of blood.No change in color of mucus.No wheezing. Skin:  no rash or lesions. No jaundice GU:  no dysuria, change in color of urine, no urgency  or frequency. No straining to urinate.  No flank pain.  Musculoskeletal:  No joint pain or no joint swelling. No decreased range of motion. No back pain.   Psych:  No change in mood or affect. No depression or anxiety. No memory loss.  Neuro: no localizing neurological complaints, no tingling, no weakness, no double vision, no gait abnormality, no slurred speech, no confusion  All systems reviewed and apart from HOPI all are negative _______________________________________________________________________________________________ Past Medical History:   Past Medical History:  Diagnosis Date   Acquired hypothyroidism 11/13/2010   Qualifier: Diagnosis of   By: Jens Som, MD, Lyn Hollingshead      Acute congestive heart failure (HCC) 03/18/2021   Acute idiopathic gout of left hand 02/16/2019   Acute kidney injury (HCC) 05/29/2023   Acute metabolic encephalopathy 09/29/2023   Acute on chronic diastolic CHF (congestive heart failure), NYHA class 3 (HCC) 09/25/2017   Acute on chronic respiratory failure with hypoxia and hypercapnia (HCC) 04/07/2022   Arthritis    "qwhere" (12/05/2017)   Asthma    Atrial flutter (HCC) 03/09/2021   Bleeding of the respiratory tract 04/07/2022   Bronchitis 05/30/2011   Cervical cancer (HCC) 2006   CHF exacerbation (HCC) 07/30/2022   Chronic heart failure with preserved ejection fraction (HCC) 02/23/2018   Chronic lower back pain    Chronic respiratory failure with hypoxia (HCC) 07/02/2018   ABG 11/2017 7.36/69   Consistent with obesity hypoventilation syndrome   Complication of anesthesia    "I have a hard time waking up from under it" (12/05/2017)   COPD (chronic obstructive pulmonary disease) (HCC) 03/04/2015   Coronary artery disease    Cough productive of clear sputum 06/03/2023   Diarrhea 06/18/2019   DJD (degenerative joint disease) 07/03/2021   DM2 (diabetes mellitus, type 2) (HCC) 11/10/2010   Dyspnea 12/05/2017   Dyspnea on exertion 02/09/2021   Edema 03/22/2011   Essential (primary) hypertension 11/10/2010   Formatting of this note might be different from the original.  Overview:   Qualifier:  Diagnosis of   By: Kem Parkinson      Last Assessment & Plan:   Watch blood pressure off of minoxidil and add medications as needed.   Essential hypertension 11/10/2010   Qualifier: Diagnosis of   By: Kem Parkinson       Gastro-esophageal reflux disease without esophagitis 11/10/2010   GERD 11/10/2010   Qualifier: Diagnosis of   By: Kem Parkinson       Hair loss 09/17/2019   Heart failure (HCC) 04/20/2021   Heart murmur    Hepatitis C    History of cervical cancer 03/22/2011   History of gout X 1   History of tobacco use 04/20/2021   Hx of atrial flutter 03/15/2021   Hyperglycemia due to type 2 diabetes mellitus (HCC) 04/20/2021   Hyperlipidemia    Hypertension associated with diabetes (HCC) 08/25/2018   Irritable bowel syndrome with diarrhea 06/18/2019   Limitation of activity due to disability 09/18/2021   Lymphedema of both lower extremities 03/22/2011   Metabolic alkalosis 03/28/2021   Microalbuminuria 04/20/2021   Migraine    "monthly" (12/05/2017)   Mild tricuspid regurgitation 04/02/2021   Mitral valve sclerosis 04/02/2021   Mixed hyperlipidemia 04/18/2020   Moderate aortic stenosis 04/20/2021   Mononeuropathy of lower extremity 04/09/2011   Formatting of this note might be different from the original.  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   Severe, corrected by CPAP 18 with C-flex of 3  2/13 bipap 20/14 sm ff mask h/h biflex+3cm  Formatting of this note might be different from the original.  Sleep study 06/2020: IMPRESSIONS  - Moderate obstructive sleep apnea occurred during this study (AHI = 17.7/h), mostly REM related  - No significant central sleep apnea occurred during   On home oxygen therapy    "2L all the time" (12/05/2017)   On supplemental oxygen by nasal cannula 04/20/2021   Onychomycosis 06/18/2019   OSA treated with BiPAP    "have CPAP at home too; wearing BiPAP right now" (12/05/2017)   Osteoarthritis 11/28/2010   Qualifier: Diagnosis of   By: Huntley Dec, Scott       Other long term (current) drug therapy 02/12/2022   Paroxysmal atrial fibrillation (HCC) 09/04/2021   Physical debility 05/10/2018   Formatting of this note might be different from the original.  Due to morbid obesity   Physical deconditioning 04/24/2021   Pneumonia    "several times" (12/05/2017)   Pneumonia due to gram-positive bacteria 08/24/2017   Pressure injury of both heels, unstageable (HCC) 07/03/2021   Pulmonary edema, acute (HCC) 02/21/2022   Renal insufficiency 04/20/2021   Restless leg syndrome 03/22/2021   Restrictive lung disease secondary to obesity 01/28/2022   S/P hysterectomy 02/19/2021   Tinea pedis of both feet 11/12/2018   Unspecified atrial flutter (HCC) 02/12/2022   Upper GI bleed 07/18/2022   Venous insufficiency of both lower extremities 07/02/2021   Weakness 03/20/2021   Weight loss 03/15/2021      Past Surgical History:  Procedure Laterality Date   ABDOMINAL SURGERY  2006 X 2   "TAH incision was infected"   BIOPSY  07/21/2022   Procedure: BIOPSY;  Surgeon: Kathi Der, MD;  Location: [facility];  Service: Gastroenterology;;   CHOLECYSTECTOMY OPEN     ESOPHAGOGASTRODUODENOSCOPY N/A 07/21/2022   Procedure: ESOPHAGOGASTRODUODENOSCOPY (EGD);  Surgeon: Kathi Der, MD;  Location: [facility];  Service:  Gastroenterology;  Laterality: N/A;   TONSILLECTOMY     TOTAL ABDOMINAL HYSTERECTOMY  2006   TRACHEOSTOMY REVISION N/A 04/09/2022   Procedure: CONTROL OF BLEEDING,TRACHEOSTOMY SITE;  Surgeon: Christia Reading, MD;  [facility];  Service: ENT;  Laterality: N/A;    Social History:  Ambulatory *** independently cane, walker  wheelchair bound, bed bound     reports that she has never smoked. She has never used smokeless tobacco. She reports that she does not currently use alcohol. She reports that she does not use drugs.     Family History: *** Family History  Problem Relation Age of Onset   Heart disease Father    ______________________________________________________________________________________________ Allergies: Allergies  Allergen Reactions   Iodinated Contrast Media Anaphylaxis   Penicillins Anaphylaxis    Patient tolerates cefepime (08/2017)   Insulin Lispro Other (See Comments)    Adder per MAR from SNF   Minoxidil Hives  Prior to Admission medications   Medication Sig Start Date End Date Taking? Authorizing Provider  albuterol (PROVENTIL) (2.5 MG/3ML) 0.083% nebulizer solution Take 3 mLs (2.5 mg total) by nebulization every 6 (six) hours as needed for wheezing or shortness of breath (I50.32). 07/04/16   Oretha Milch, MD  ALPRAZolam Prudy Feeler) 0.5 MG tablet Take 0.5 mg by mouth 2 (two) times daily as needed for anxiety. 09/05/23   [provider]  amiodarone (PACERONE) 200 MG tablet Take 1 tablet (200 mg total) by mouth daily. 06/21/23   Alfredo Martinez, MD  ammonium lactate (LAC-HYDRIN) 12 % lotion Apply 1 Application topically at bedtime.    [provider]  atorvastatin (LIPITOR) 20 MG tablet Take 20 mg by mouth every evening.    [provider]  buPROPion ER (WELLBUTRIN SR) 100 MG 12 hr tablet Take 1 tablet (100 mg total) by mouth 2 (two) times daily. 06/08/23   Alfredo Martinez, MD  busPIRone (BUSPAR) 7.5 MG tablet Take 1 tablet (7.5 mg  total) by mouth 2 (two) times daily. 10/07/23   Tiffany Kocher, DO  Cholecalciferol 125 MCG (5000 UT) TABS Take 1 tablet (5,000 Units total) by mouth daily. 07/10/22   [name], Werner Lean, MD  Dulaglutide (TRULICITY) 0.75 MG/0.5ML SOAJ Inject 0.75 mg into the skin every Saturday.    [provider]  ELIQUIS 5 MG TABS tablet Take 5 mg by mouth 2 (two) times daily. 04/12/21   [provider]  empagliflozin (JARDIANCE) 25 MG TABS tablet Take 1 tablet (25 mg total) by mouth daily. 08/20/23   Celine Mans, MD  ferrous sulfate 325 (65 FE) MG EC tablet Take 325 mg by mouth daily.    [provider]  Fluticasone-Umeclidin-Vilant (TRELEGY ELLIPTA) 100-62.5-25 MCG/ACT AEPB Inhale 1 puff into the lungs daily. 10/04/23   Levin Erp, MD  insulin glargine (LANTUS) 100 UNIT/ML Solostar Pen Inject 35 Units into the skin in the morning. 10/04/23   Levin Erp, MD  insulin glargine-yfgn (SEMGLEE) 100 UNIT/ML injection Inject 0.4 mLs (40 Units total) into the skin at bedtime. 10/06/23   Alicia Amel, MD  levothyroxine (SYNTHROID) 150 MCG tablet Take 1 tablet (150 mcg total) by mouth daily at 6 (six) AM. Patient taking differently: Take 150 mcg by mouth daily. 07/10/22   [name], Werner Lean, MD  loperamide (IMODIUM) 2 MG capsule Take 2 mg by mouth every 4 (four) hours as needed for diarrhea or loose stools.    [provider]  loratadine (CLARITIN) 10 MG tablet Take 1 tablet (10 mg total) by mouth daily. 10/04/23   Levin Erp, MD  montelukast (SINGULAIR) 10 MG tablet Take 10 mg by mouth at bedtime.    [provider]  Multiple Vitamin (MULTIVITAMIN WITH MINERALS) TABS tablet Take 1 tablet by mouth daily.    [provider]  NON FORMULARY Apply BIPAP every night at bedtime.    [provider]  nystatin cream (MYCOSTATIN) Apply topically 2 (two) times daily. 06/24/23   Alfredo Martinez, MD  omeprazole (PRILOSEC) 20 MG capsule Take 20 mg by mouth  daily. 10/02/22   [provider]  oxyCODONE-acetaminophen (PERCOCET) 10-325 MG tablet Take 1 tablet by mouth every 6 (six) hours. 06/21/23   Alfredo Martinez, MD  OXYGEN Inhale 5 L/min into the lungs in the morning and at bedtime. Connect to BIPAP qhs    [provider]  potassium chloride SA (KLOR-CON M) 20 MEQ tablet Take 2 tablets (40 mEq total) by mouth daily. 06/21/23  Alfredo Martinez, MD  rOPINIRole (REQUIP) 0.5 MG tablet Take 1 tablet (0.5 mg total) by mouth at bedtime. 06/21/23   Alfredo Martinez, MD  torsemide (DEMADEX) 20 MG tablet Take 60 mg by mouth 2 (two) times daily.    [provider]    ___________________________________________________________________________________________________ Physical Exam:    11/03/2023    8:45 PM 11/03/2023    8:15 PM 11/03/2023    7:31 PM  Vitals with BMI  Systolic 92 98 118  Diastolic 60 70 72  Pulse 88 91 93     1. General:  in No ***Acute distress***increased work of breathing ***complaining of severe pain****agitated * Chronically ill *well *cachectic *toxic acutely ill -appearing 2. Psychological: Alert and *** Oriented 3. Head/ENT:   Moist *** Dry Mucous Membranes                          Head Non traumatic, neck supple                          Normal *** Poor Dentition 4. SKIN: normal *** decreased Skin turgor,  Skin clean Dry and intact no rash    5. Heart: Regular rate and rhythm no*** Murmur, no Rub or gallop 6. Lungs: ***Clear to auscultation bilaterally, no wheezes or crackles   7. Abdomen: Soft, ***non-tender, Non distended *** obese ***bowel sounds present 8. Lower extremities: no clubbing, cyanosis, no ***edema 9. Neurologically Grossly intact, moving all 4 extremities equally *** strength 5 out of 5 in all 4 extremities cranial nerves II through XII intact 10. MSK: Normal range of motion    Chart has been  reviewed  ______________________________________________________________________________________________  Assessment/Plan  ***  Admitted for *** There are no diagnoses linked to this encounter.   Present on Admission: **None**     No problem-specific Assessment & Plan notes found for this encounter.    Other plan as per orders.  DVT prophylaxis:  SCD *** Lovenox       Code Status:    Code Status: Prior FULL CODE *** DNR/DNI ***comfort care as per patient ***family  I had personally discussed CODE STATUS with patient and family*  ACP *** none has been reviewed ***   Family Communication:   Family not at  Bedside  plan of care was discussed on the phone with *** Son, Daughter, Wife, Husband, Sister, Brother , father, mother  Diet    Disposition Plan:   *** likely will need placement for rehabilitation                          Back to current facility when stable                            To home once workup is complete and patient is stable  ***Following barriers for discharge:                             Chest pain *** Stroke *** work up is complete                            Electrolytes corrected  Anemia corrected h/H stable                             Pain controlled with PO medications                               Afebrile, white count improving able to transition to PO antibiotics                             Will need to be able to tolerate PO                            Will likely need home health, home O2, set up                           Will need consultants to evaluate patient prior to discharge       Consult Orders  (From admission, onward)           Start     Ordered   11/03/23 2151  Consult to hospitalist  Once       Provider:  (Not yet assigned)  Question Answer Comment  Place call to: [facility]   Reason for Consult Admit      11/03/23 2150                              ***Would benefit from  PT/OT eval prior to DC  Ordered                   Swallow eval - SLP ordered                   Diabetes care coordinator                   Transition of care consulted                   Nutrition    consulted                  Wound care  consulted                   Palliative care    consulted                   Behavioral health  consulted                    Consults called: ***     Admission status:  ED Disposition     None        Obs***  ***  inpatient     I Expect 2 midnight stay secondary to severity of patient's current illness need for inpatient interventions justified by the following: ***hemodynamic instability despite optimal treatment (tachycardia *hypotension * tachypnea *hypoxia, hypercapnia) * Severe lab/radiological/exam abnormalities including:     and extensive comorbidities including: *substance abuse  *Chronic pain *DM2  * CHF * CAD  * COPD/asthma *Morbid Obesity * CKD *dementia *liver disease *history of stroke with residual deficits *  malignancy, * sickle cell disease  History of amputation Chronic anticoagulation  That are currently affecting medical management.   I expect  patient to be hospitalized for 2 midnights requiring inpatient medical care.  Patient is at high risk for adverse outcome (such as loss of life or disability) if not treated.  Indication for inpatient stay as follows:  Severe change from baseline regarding mental status Hemodynamic instability despite maximal medical therapy,  ongoing suicidal ideations,  severe pain requiring acute inpatient management,  inability to maintain oral hydration   persistent chest pain despite medical management Need for operative/procedural  intervention New or worsening hypoxia   Need for IV antibiotics, IV fluids, IV rate controling medications, IV antihypertensives, IV pain medications, IV anticoagulation, need for biPAP    Level of care   *** tele  For 12H 24H     medical  floor       progressive     stepdown   tele indefinitely please discontinue once patient no longer qualifies COVID-19 Labs    Lab Results  Component Value Date   SARSCOV2NAA NEGATIVE 11/03/2023     Precautions: admitted as *** Covid Negative  ***asymptomatic screening protocol****PUI *** covid positive No active isolations ***If Covid PCR is negative  - please DC precautions - would need additional investigation given very high risk for false native test result    Critical***  Patient is critically ill due to  hemodynamic instability * respiratory failure *severe sepsis* ongoing chest pain*  They are at high risk for life/limb threatening clinical deterioration requiring frequent reassessment and modifications of care.  Services provided include examination of the patient, review of relevant ancillary tests, prescription of lifesaving therapies, review of medications and prophylactic therapy.  Total critical care time excluding separately billable procedures: 60*  Minutes.    Dejai Schubach 11/03/2023, 10:06 PM ***  [facility]     after 2 AM please page floor coverage PA If 7AM-7PM, please contact the day team taking care of the patient using [email or web address]

## 2023-11-03 NOTE — H&P (Cosign Needed)
Hospital Admission History and Physical Service Pager: [phone]  Patient name: Chelsea [name] Medical record number: [id number] Date of Birth: 09-Sep-1966 Age: 58 y.o. Gender: female  Primary Care Provider: Pcp, No Consultants: None Code Status: Full Preferred Emergency Contact:   Contact Information     Name Relation Home Work [place] Significant other   [phone]      Other Contacts     Name Relation Home Work [place] Sister   [phone]   Michelene Gardener Brother   [phone]        Chief Complaint: hypoxia  Assessment and Plan: Chelsea [name] is a 58 y.o. female presenting with acute hypoxic respiratory failure. Differential for this patient's presentation of this includes HCAP vs aspiration pneumonitis, CHF exacerbation, COPD excerebration, BiPAP noncompliance, less likely ACS. Patient was recently admitted for acute hypoxic respiratory failure and treated for CHF exacerbation and CAP.   Differential remains broad at this time, most likely multifactorial given patients history of OSA/OHS with low respiratory reserve. Patient recently admitted for similar presentation but requiring intubation and short ICU stay. Was also recently treated for CAP, but was febrile on admission with chest xray showing concern for pneumonia, HCAP is high on differential. Patient does have recent history of gastroenteritis with vomiting, may have aspect of aspiration pneumonitis contributing to clinical picture. Per ED provider, patient was also found by staff at [facility] without oxygen on and does have history of noncompliance with BiPAP, along with hypercarbia on VBG, which may also be contributing to presentation. CHF exacerbation also a possibility, though patient appears at dry weight with volume status difficult to ascertain given body habitus and BNP mildly elevated. Patient treated with IV lasix in ED. Will continue to monitor  output and consider additional diuresis. Less likely COPD exacerbation given no wheezing or cough on exam. Less likely concerned for ACS or PE given denying chest pain, not tachycardic, without asymmetric lower extremity edema.    Assessment & Plan Acute respiratory failure with hypoxia and hypercarbia (HCC) Patient somnelent on exam, but arousable and able to respond to questions appropriately. VBG showing pCO2 on 65 with bicarb 39. Differential broad at this time. Etiology likely multifactorial complicated by patients minimal respiratory reserve due to OSA/OHS. Considering HCAP, aspiration pneumonitis vs CHF exacerbation. Patient is s/p 40mg  IV lasix in ED. Also treated with ceftriaxone and azithromycin x1 in ED.   - Admit to FMTS, attending Dr. Manson Passey, Med Surg - Continuous pulse ox   - Repeat VBG - Strict I/Os - Consider additional lasix if appears volume overloaded  - continue BiPAP, wean as tolerated to O2goal of 88-92%  - continue azithromycin and ceftriaxone, consider broadening for MRSA and pseudomonas coverage if continues to fever - NPO given somnolence and BiPAP  - Did not order oxycodone given somnolence, consider restarting once mental status improves - AM CBC, BMP     Chronic and Stable Conditions:  HFpEF: holding spironolactone given softer BP, continue jardiance 25 mg A flutter: currently in NSR, continue amiodarone 200 mg daily, continue Eliquis 5 mg BID. Continue potassium supplementation.  COPD: continue home inhalers, Singulair DM II: NPO for now, CBG at bedtime with mSSI Anemia: continue iron supplementation HLD: continue home atorvastatin Hypothyroidism: continue home levothyroxine 150 mcg daily HLD: continue atorvastatin 20 mg at bedtime RLS: continue home ropinirole 0.5 mg  GERD: continue pantoprazole 40 mg daily Chronic pain holding home meds given altered mental status  FEN/GI: NPO except sips with meds  VTE Prophylaxis: Home eliquis   Disposition:  med surg   History of Present Illness:  Chelsea [name] is a 58 y.o. female presenting with acute hypoxic resporiatory failure.  Per ED provider, patietn was found off oxygen at [facility] and hypoxic. After she was put back on, did not improve oxygenation, so brought to ED. In the ED, hypoxic to 70% and placed on nonrebreather with improvement. History difficult given patient's somnolence, but reports recently having gastroenteritis with diarrhea and vomiting. Reports last episode of vomiting was last night. Denies shortness of breath or chest pain. Collateral obtained from parter [facility]) who reports he called her around 11 am today and she sounded normal at that time.   In the ED, patient initially put on non-rebreather with improvement in O2 status, transitioned to HFNC. Labs signigicant for pCO2 of 65 on VBG. WBC elevated to 13.4. Lactic acid WNL. Respiratory panel negative. BNP mildly elevated to 411. Chest xray showed heterogeneous bilateral opacities right greater than left. Patient treated with ceftriaxone and azithromycin x1, solumedrol, mag sulfate and given 40mg  IV lasix.   Review Of Systems: Per HPI   Pertinent Past Medical History:  Remainder reviewed in history tab.   Pertinent Past Surgical History: Chronic hypercapnia, hypoxic respiratory failure on 5 L [place] OSA/OHS with nocturnal hypoxemia on BiPAP COPD HFpEF (60-65% Aug 2024)  A-flutter on Eliquis T2DM HTN HLD Hypothyroidism  Restless leg syndrome IDA  Remainder reviewed in history tab.   Pertinent Social History: Per history tab  Pertinent Family History: Per history tab  Important Outpatient Medications: Amiodarone 200 mg daily Atorvastatin 20 mg daily - Taking Budesonide nebulizer 0.5 mg q12h Bupropion ER 100 mg BID Eliquis 5 mg BID  Empaglifozin 25 mg daily  Ferrous sulfate 325 mg daily Gabapentin 400 mg TID Insulin glargine 30 units daily  Levothyroxine 150 mcg daily  Montelukast 10 mg daily   Omeprazole 20 mg daily  Percocet 10-325 Q6h  Potassium 40 mEq daily  Ropinirole 0.5 QHS  Spironolactone 25 mg daily  Torsemide 60 mg BID  Oxygen 5 L/min  BiPAP QHS  Remainder reviewed in medication history.   Objective: BP (!) 97/57   Pulse 81   Temp (!) 101.1 F (38.4 C) (Oral)   Resp (!) 27   Ht 5\' 7"  (1.702 m)   Wt (!) 146.5 kg   SpO2 94%   BMI 50.59 kg/m  Exam: General: Somnolent,  Speaking in short sentences. Waxing and waning conciousness, needs frequent prompting during exam. Obese  HEENT: atraumatic, MMM, no HFNC, MMM Cardiovascular: RRR, systolic ejection murmur heard best at LUSB Respiratory: diminished lung sounds with auscultation limited to body habitus  Gastrointestinal: obese, not TTP MSK: moves all extremities  Derm: chronic venous skin changes with erythema on bilateral lower extremities  Neuro: Ox3, no focal neurological deficits   Labs:  CBC BMET  Recent Labs  Lab 11/03/23 1951 11/03/23 1955  WBC 13.4*  --   HGB 11.9* 13.6  HCT 40.3 40.0  PLT 212  --    Recent Labs  Lab 11/03/23 1951 11/03/23 1955  NA 136 137  K 4.2 4.2  CL 92*  --   CO2 31  --   BUN 27*  --   CREATININE 0.94  --   GLUCOSE 218*  --   CALCIUM 8.3*  --     Pertinent additional labs  BNP: 411.3 VBG:  pH, Ven 7.394  pCO2, Ven 65.1 High          pO2, Ven 46 High          Bicarbonate 39.8 High          TCO2 42 High          O2 Saturation 79         Acid-Base Excess 12.0 High          Sodium 137         Potassium 4.2         Calcium, Ion 1.05 Low          HCT 40.0         Hemoglobin 13.6         Sample type VENOUS          EKG: My own interpretation (not copied from electronic read)   NSR, 90 BMP without Qtc prolongation     Imaging Studies Performed:  Chest Xray:  Enlarged cardiomediastinal silhouette with central congestion. Heterogeneous bilateral opacities right greater than left, the suspect infection over edema.   Penne Lash,  MD 11/03/2023, 11:47 PM PGY-1, [facility]  FPTS Intern pager: [phone], text pages welcome Secure chat group [facility] Teaching Service

## 2023-11-04 DIAGNOSIS — I509 Heart failure, unspecified: Secondary | ICD-10-CM | POA: Diagnosis not present

## 2023-11-04 DIAGNOSIS — J440 Chronic obstructive pulmonary disease with acute lower respiratory infection: Secondary | ICD-10-CM | POA: Diagnosis present

## 2023-11-04 DIAGNOSIS — E662 Morbid (severe) obesity with alveolar hypoventilation: Secondary | ICD-10-CM | POA: Diagnosis present

## 2023-11-04 DIAGNOSIS — J9602 Acute respiratory failure with hypercapnia: Secondary | ICD-10-CM | POA: Diagnosis not present

## 2023-11-04 DIAGNOSIS — J9621 Acute and chronic respiratory failure with hypoxia: Secondary | ICD-10-CM

## 2023-11-04 DIAGNOSIS — I483 Typical atrial flutter: Secondary | ICD-10-CM | POA: Diagnosis not present

## 2023-11-04 DIAGNOSIS — Z6841 Body Mass Index (BMI) 40.0 and over, adult: Secondary | ICD-10-CM | POA: Diagnosis not present

## 2023-11-04 DIAGNOSIS — G4733 Obstructive sleep apnea (adult) (pediatric): Secondary | ICD-10-CM | POA: Diagnosis not present

## 2023-11-04 DIAGNOSIS — J9601 Acute respiratory failure with hypoxia: Secondary | ICD-10-CM | POA: Diagnosis not present

## 2023-11-04 DIAGNOSIS — I4891 Unspecified atrial fibrillation: Secondary | ICD-10-CM | POA: Diagnosis not present

## 2023-11-04 DIAGNOSIS — J441 Chronic obstructive pulmonary disease with (acute) exacerbation: Secondary | ICD-10-CM | POA: Diagnosis present

## 2023-11-04 DIAGNOSIS — J09X1 Influenza due to identified novel influenza A virus with pneumonia: Secondary | ICD-10-CM | POA: Diagnosis not present

## 2023-11-04 DIAGNOSIS — G8929 Other chronic pain: Secondary | ICD-10-CM | POA: Diagnosis present

## 2023-11-04 DIAGNOSIS — J9622 Acute and chronic respiratory failure with hypercapnia: Secondary | ICD-10-CM | POA: Diagnosis present

## 2023-11-04 DIAGNOSIS — I480 Paroxysmal atrial fibrillation: Secondary | ICD-10-CM | POA: Diagnosis not present

## 2023-11-04 DIAGNOSIS — G2581 Restless legs syndrome: Secondary | ICD-10-CM | POA: Diagnosis present

## 2023-11-04 DIAGNOSIS — J189 Pneumonia, unspecified organism: Secondary | ICD-10-CM | POA: Diagnosis present

## 2023-11-04 DIAGNOSIS — E1165 Type 2 diabetes mellitus with hyperglycemia: Secondary | ICD-10-CM | POA: Diagnosis present

## 2023-11-04 DIAGNOSIS — D649 Anemia, unspecified: Secondary | ICD-10-CM | POA: Diagnosis present

## 2023-11-04 DIAGNOSIS — I5031 Acute diastolic (congestive) heart failure: Secondary | ICD-10-CM | POA: Diagnosis not present

## 2023-11-04 DIAGNOSIS — Z794 Long term (current) use of insulin: Secondary | ICD-10-CM | POA: Diagnosis not present

## 2023-11-04 DIAGNOSIS — I4892 Unspecified atrial flutter: Secondary | ICD-10-CM | POA: Diagnosis not present

## 2023-11-04 DIAGNOSIS — I2609 Other pulmonary embolism with acute cor pulmonale: Secondary | ICD-10-CM | POA: Diagnosis present

## 2023-11-04 DIAGNOSIS — I5033 Acute on chronic diastolic (congestive) heart failure: Secondary | ICD-10-CM | POA: Diagnosis present

## 2023-11-04 DIAGNOSIS — E039 Hypothyroidism, unspecified: Secondary | ICD-10-CM | POA: Diagnosis present

## 2023-11-04 DIAGNOSIS — I110 Hypertensive heart disease with heart failure: Secondary | ICD-10-CM | POA: Diagnosis present

## 2023-11-04 DIAGNOSIS — J101 Influenza due to other identified influenza virus with other respiratory manifestations: Secondary | ICD-10-CM | POA: Diagnosis not present

## 2023-11-04 DIAGNOSIS — J111 Influenza due to unidentified influenza virus with other respiratory manifestations: Secondary | ICD-10-CM | POA: Diagnosis not present

## 2023-11-04 DIAGNOSIS — E1141 Type 2 diabetes mellitus with diabetic mononeuropathy: Secondary | ICD-10-CM | POA: Diagnosis present

## 2023-11-04 DIAGNOSIS — E66813 Obesity, class 3: Secondary | ICD-10-CM | POA: Diagnosis present

## 2023-11-04 DIAGNOSIS — I272 Pulmonary hypertension, unspecified: Secondary | ICD-10-CM | POA: Diagnosis present

## 2023-11-04 DIAGNOSIS — D6869 Other thrombophilia: Secondary | ICD-10-CM | POA: Diagnosis not present

## 2023-11-04 DIAGNOSIS — E871 Hypo-osmolality and hyponatremia: Secondary | ICD-10-CM | POA: Diagnosis not present

## 2023-11-04 LAB — BASIC METABOLIC PANEL
Anion gap: 12 (ref 5–15)
Anion gap: 13 (ref 5–15)
BUN: 29 mg/dL — ABNORMAL HIGH (ref 6–20)
BUN: 33 mg/dL — ABNORMAL HIGH (ref 6–20)
CO2: 32 mmol/L (ref 22–32)
CO2: 34 mmol/L — ABNORMAL HIGH (ref 22–32)
Calcium: 8.7 mg/dL — ABNORMAL LOW (ref 8.9–10.3)
Calcium: 9 mg/dL (ref 8.9–10.3)
Chloride: 95 mmol/L — ABNORMAL LOW (ref 98–111)
Chloride: 91 mmol/L — ABNORMAL LOW (ref 98–111)
Creatinine, Ser: 0.93 mg/dL (ref 0.44–1.00)
Creatinine, Ser: 1.02 mg/dL — ABNORMAL HIGH (ref 0.44–1.00)
GFR, Estimated: >60 mL/min (ref >60)
GFR, Estimated: >60 mL/min (ref >60)
Glucose, Bld: 226 mg/dL — ABNORMAL HIGH (ref 70–99)
Glucose, Bld: 221 mg/dL — ABNORMAL HIGH (ref 70–99)
Potassium: 4.4 mmol/L (ref 3.5–5.1)
Potassium: 4.2 mmol/L (ref 3.5–5.1)
Sodium: 139 mmol/L (ref 135–145)
Sodium: 138 mmol/L (ref 135–145)

## 2023-11-04 LAB — CBG MONITORING, ED
Glucose-Capillary: 198 mg/dL — ABNORMAL HIGH (ref 70–99)
Glucose-Capillary: 187 mg/dL — ABNORMAL HIGH (ref 70–99)

## 2023-11-04 LAB — BLOOD GAS, VENOUS
Acid-Base Excess: 11.4 mmol/L — ABNORMAL HIGH (ref 0.0–2.0)
Bicarbonate: 40.3 mmol/L — ABNORMAL HIGH (ref 20.0–28.0)
O2 Saturation: 98.5 %
Patient temperature: 37
pCO2, Ven: 73 mm[Hg] (ref 44–60)
pH, Ven: 7.35 (ref 7.25–7.43)
pO2, Ven: 116 mm[Hg] — ABNORMAL HIGH (ref 32–45)

## 2023-11-04 LAB — CBC WITH DIFFERENTIAL/PLATELET
Abs Immature Granulocytes: 0.04 10*3/uL (ref 0.00–0.07)
Basophils Absolute: 0 10*3/uL (ref 0.0–0.1)
Basophils Relative: 0 %
Eosinophils Absolute: 0 10*3/uL (ref 0.0–0.5)
Eosinophils Relative: 0 %
HCT: 39.8 % (ref 36.0–46.0)
Hemoglobin: 11.9 g/dL — ABNORMAL LOW (ref 12.0–15.0)
Immature Granulocytes: 0 %
Lymphocytes Relative: 7 %
Lymphs Abs: 0.7 10*3/uL (ref 0.7–4.0)
MCH: 27 pg (ref 26.0–34.0)
MCHC: 29.9 g/dL — ABNORMAL LOW (ref 30.0–36.0)
MCV: 90.2 fL (ref 80.0–100.0)
Monocytes Absolute: 0.3 10*3/uL (ref 0.1–1.0)
Monocytes Relative: 3 %
Neutro Abs: 9.1 10*3/uL — ABNORMAL HIGH (ref 1.7–7.7)
Neutrophils Relative %: 90 %
Platelets: 210 10*3/uL (ref 150–400)
RBC: 4.41 MIL/uL (ref 3.87–5.11)
RDW: 15.9 % — ABNORMAL HIGH (ref 11.5–15.5)
WBC: 10.1 10*3/uL (ref 4.0–10.5)
nRBC: 0 % (ref 0.0–0.2)

## 2023-11-04 LAB — TROPONIN I (HIGH SENSITIVITY): Troponin I (High Sensitivity): 11 ng/L (ref <18)

## 2023-11-04 LAB — MAGNESIUM
Magnesium: 2.3 mg/dL (ref 1.7–2.4)
Magnesium: 2.4 mg/dL (ref 1.7–2.4)

## 2023-11-04 LAB — MRSA NEXT GEN BY PCR, NASAL: MRSA by PCR Next Gen: NOT DETECTED

## 2023-11-04 LAB — GLUCOSE, CAPILLARY: Glucose-Capillary: 170 mg/dL — ABNORMAL HIGH (ref 70–99)

## 2023-11-04 MED ORDER — APIXABAN 5 MG PO TABS
5.0000 mg | ORAL_TABLET | Freq: Two times a day (BID) | ORAL | Status: DC
Start: 2023-11-04 — End: 2023-11-06
  Administered 2023-11-04 – 2023-11-05 (×4): 5 mg via ORAL
  Filled 2023-11-04 (×4): qty 1

## 2023-11-04 MED ORDER — ROPINIROLE HCL 0.5 MG PO TABS
0.5000 mg | ORAL_TABLET | Freq: Every day | ORAL | Status: DC
  Administered 2023-11-04: 0.5 mg via ORAL
  Filled 2023-11-04: qty 1

## 2023-11-04 MED ORDER — INSULIN GLARGINE-YFGN 100 UNIT/ML ~~LOC~~ SOLN
10.0000 [IU] | Freq: Two times a day (BID) | SUBCUTANEOUS | Status: DC
  Administered 2023-11-04 – 2023-11-05 (×3): 10 [IU] via SUBCUTANEOUS
  Filled 2023-11-04 (×7): qty 0.1

## 2023-11-04 MED ORDER — ACETAMINOPHEN 500 MG PO TABS
1000.0000 mg | ORAL_TABLET | Freq: Four times a day (QID) | ORAL | Status: DC | PRN
  Administered 2023-11-04 – 2023-11-14 (×10): 1000 mg via ORAL
  Filled 2023-11-04 (×11): qty 2

## 2023-11-04 MED ORDER — FUROSEMIDE 10 MG/ML IJ SOLN
40.0000 mg | Freq: Once | INTRAMUSCULAR | Status: AC
  Administered 2023-11-05: 40 mg via INTRAVENOUS
  Filled 2023-11-04 (×2): qty 4

## 2023-11-04 MED ORDER — FUROSEMIDE 10 MG/ML IJ SOLN
40.0000 mg | Freq: Once | INTRAMUSCULAR | Status: AC
  Administered 2023-11-04: 40 mg via INTRAVENOUS
  Filled 2023-11-04: qty 4

## 2023-11-04 NOTE — ED Notes (Signed)
Pt switched from BIPAP to 5L via n/c, tol well so far.

## 2023-11-04 NOTE — ED Notes (Signed)
Pt not given PO meds at this time d/t BIPAP.

## 2023-11-04 NOTE — Progress Notes (Addendum)
Daily Progress Note Intern Pager: [phone]  Patient name: Chelsea [name] Medical record number: [id number] Date of birth: 11/11/1965 Age: 58 y.o. Gender: female  Primary Care Provider: Pcp, No Consultants: None Code Status: Full  Pt Overview and Major Events to Date:  2/2 Admitted, started azithro and vanc, placed on BiPAP 2/3 Wean O2 as tolerable and continue abx    Assessment and Plan:  Chelsea [name] is a 58 y.o. female presenting with acute hypoxic respiratory failure. Differential for this patient's presentation of this includes HAP vs aspiration pneumonitis, CHF exacerbation, COPD excerebration, BiPAP noncompliance, less likely ACS. Patient was recently admitted for acute hypoxic respiratory failure and treated for CHF exacerbation and CAP.   Assessment & Plan Acute respiratory failure with hypoxia and hypercarbia (HCC) Patient more alert on exam and tolerating BiPAP well. Etiology likely multifactorial complicated by patients minimal respiratory reserve due to OSA/OHS. Considering HAP, aspiration pneumonitis vs CHF exacerbation. Patient is s/p 40mg  IV lasix in ED and additional dose of 40 mg IV this morning and started on abx. - Continuous pulse ox - Continue Strict I/Os - Give additional Lasix 40 mg x1 at 1800 today - continue BiPAP while sleeping/naps, wean as tolerated to O2goal of 88-92% - NPO while on BiPAP  - continue azithromycin and ceftriaxone (2/2- ), consider broadening for MRSA and pseudomonas coverage if becomes febrile  - Continue to hold oxycodone for now, given respiratory status and altered mentation on admission yesterday - AM CBC, BMP and Hepatic Functional panel   - f/u Strep Urine Antigen, Legionella and MRSA   Chronic and Stable Problems:   HFpEF: Continue jardiance 25 mg A flutter: currently in NSR, continue amiodarone 200 mg daily, continue Eliquis 5 mg BID. Holding potassium supplementation currently due to K 4.4  COPD: continue  home inhalers, Singulair DM II: Heart Diet, CBG at bedtime with mSSI Anemia: continue iron supplementation HLD: continue home atorvastatin Hypothyroidism: continue home levothyroxine 150 mcg daily HLD: continue atorvastatin 20 mg at bedtime RLS: Hold home ropinirole 0.5 mg in the setting of somnolence on admission and want to limit central acting agents  GERD: continue pantoprazole 40 mg daily Chronic pain holding home meds, consider restarting if mentation continues to improve  FEN/GI: Heart Diet, Needs to be NPO while wearing BiPAP   PPx: Home Eliquis   Dispo: Med surg   Subjective:  Patient reports improving symptoms. Denies fevers and chills. Boyfriend bedside and reports GI bug ongoing throughout facility.   Objective: Temp:  [97.5 F (36.4 C)-101.1 F (38.4 C)] 97.5 F (36.4 C) (02/03 0515) Pulse Rate:  [71-93] 71 (02/03 0545) Resp:  [14-33] 26 (02/03 0545) BP: (92-124)/(57-113) 122/72 (02/03 0545) SpO2:  [90 %-99 %] 99 % (02/03 0545) FiO2 (%):  [70 %] 70 % (02/03 0304) Weight:  [146.5 kg] 146.5 kg (02/02 1922) Physical Exam: General: Alert, speaking in short sentences, with BiPAP in place Obese  HEENT: atraumatic, MMM, no HFNC, MMM Cardiovascular: RRR, systolic ejection murmur heard best at LUSB Respiratory: diminished lung sounds with auscultation limited to body habitus  Gastrointestinal: obese, not TTP MSK: moves all extremities  Derm: chronic venous skin changes with erythema on bilateral lower extremities  Neuro: Ox3, no focal neurological deficits   Laboratory: Most recent CBC Lab Results  Component Value Date   WBC 10.1 11/04/2023   HGB 11.9 (L) 11/04/2023   HCT 39.8 11/04/2023   MCV 90.2 11/04/2023   PLT 210 11/04/2023   Most recent BMP  Latest Ref Rng & Units 11/04/2023    5:26 AM  BMP  Glucose 70 - 99 mg/dL 161   BUN 6 - 20 mg/dL 29   Creatinine 0.96 - 1.00 mg/dL 0.45   Sodium 409 - 811 mmol/L 139   Potassium 3.5 - 5.1 mmol/L 4.4   Chloride  98 - 111 mmol/L 95   CO2 22 - 32 mmol/L 32   Calcium 8.9 - 10.3 mg/dL 8.7     Other pertinent labs   Legionella pending Strep pneumo pending MRSA pending   Imaging/Diagnostic Tests: Radiologist Impression: Enlarged cardiomediastinal silhouette with central congestion. Heterogeneous bilateral opacities right greater than left, the suspect infection over edema My interpretation: Bilateral opacities, R>L  infection more likely than edema  Peterson Ao, MD 11/04/2023, 7:24 AM  PGY-1, [facility] Family Medicine, Psychiatry Resident  FPTS Intern pager: [phone], text pages welcome Secure chat group [facility] Teaching Service

## 2023-11-04 NOTE — Assessment & Plan Note (Addendum)
Patient more alert on exam and tolerating BiPAP well. Etiology likely multifactorial complicated by patients minimal respiratory reserve due to OSA/OHS. Considering HAP, aspiration pneumonitis vs CHF exacerbation. Patient is s/p 40mg  IV lasix in ED and additional dose of 40 mg IV this morning and started on abx. - Continuous pulse ox - Continue Strict I/Os - Give additional Lasix 40 mg x1 at 1800 today - continue BiPAP while sleeping/naps, wean as tolerated to O2goal of 88-92% - NPO while on BiPAP  - continue azithromycin and ceftriaxone (2/2- ), consider broadening for MRSA and pseudomonas coverage if becomes febrile  - Continue to hold oxycodone for now, given respiratory status and altered mentation on admission yesterday - AM CBC, BMP and Hepatic Functional panel   - f/u Strep Urine Antigen, Legionella and MRSA

## 2023-11-05 ENCOUNTER — Inpatient Hospital Stay (HOSPITAL_COMMUNITY): Payer: Medicare Other

## 2023-11-05 DIAGNOSIS — I509 Heart failure, unspecified: Secondary | ICD-10-CM | POA: Diagnosis not present

## 2023-11-05 LAB — BLOOD GAS, VENOUS
Acid-Base Excess: 12.8 mmol/L — ABNORMAL HIGH (ref 0.0–2.0)
Acid-Base Excess: 14.8 mmol/L — ABNORMAL HIGH (ref 0.0–2.0)
Bicarbonate: 42 mmol/L — ABNORMAL HIGH (ref 20.0–28.0)
Bicarbonate: 43.9 mmol/L — ABNORMAL HIGH (ref 20.0–28.0)
Drawn by: 68626
O2 Saturation: 91.8 %
O2 Saturation: 92.4 %
Patient temperature: 37
Patient temperature: 37
pCO2, Ven: 76 mm[Hg] (ref 44–60)
pCO2, Ven: 76 mm[Hg] (ref 44–60)
pH, Ven: 7.35 (ref 7.25–7.43)
pH, Ven: 7.37 (ref 7.25–7.43)
pO2, Ven: 65 mm[Hg] — ABNORMAL HIGH (ref 32–45)
pO2, Ven: 68 mm[Hg] — ABNORMAL HIGH (ref 32–45)

## 2023-11-05 LAB — HEPATIC FUNCTION PANEL
ALT: 12 U/L (ref 0–44)
AST: 13 U/L — ABNORMAL LOW (ref 15–41)
Albumin: 2.8 g/dL — ABNORMAL LOW (ref 3.5–5.0)
Alkaline Phosphatase: 83 U/L (ref 38–126)
Bilirubin, Direct: <0.1 mg/dL (ref 0.0–0.2)
Total Bilirubin: 0.4 mg/dL (ref 0.0–1.2)
Total Protein: 7.2 g/dL (ref 6.5–8.1)

## 2023-11-05 LAB — CBC WITH DIFFERENTIAL/PLATELET
Abs Immature Granulocytes: 0.04 10*3/uL (ref 0.00–0.07)
Basophils Absolute: 0 10*3/uL (ref 0.0–0.1)
Basophils Relative: 0 %
Eosinophils Absolute: 0 10*3/uL (ref 0.0–0.5)
Eosinophils Relative: 0 %
HCT: 37.6 % (ref 36.0–46.0)
Hemoglobin: 11 g/dL — ABNORMAL LOW (ref 12.0–15.0)
Immature Granulocytes: 1 %
Lymphocytes Relative: 16 %
Lymphs Abs: 1.4 10*3/uL (ref 0.7–4.0)
MCH: 26.6 pg (ref 26.0–34.0)
MCHC: 29.3 g/dL — ABNORMAL LOW (ref 30.0–36.0)
MCV: 91 fL (ref 80.0–100.0)
Monocytes Absolute: 1.2 10*3/uL — ABNORMAL HIGH (ref 0.1–1.0)
Monocytes Relative: 14 %
Neutro Abs: 6.2 10*3/uL (ref 1.7–7.7)
Neutrophils Relative %: 69 %
Platelets: 258 10*3/uL (ref 150–400)
RBC: 4.13 MIL/uL (ref 3.87–5.11)
RDW: 15.9 % — ABNORMAL HIGH (ref 11.5–15.5)
WBC: 8.8 10*3/uL (ref 4.0–10.5)
nRBC: 0 % (ref 0.0–0.2)

## 2023-11-05 LAB — BASIC METABOLIC PANEL
Anion gap: 13 (ref 5–15)
BUN: 38 mg/dL — ABNORMAL HIGH (ref 6–20)
CO2: 33 mmol/L — ABNORMAL HIGH (ref 22–32)
Calcium: 8.9 mg/dL (ref 8.9–10.3)
Chloride: 91 mmol/L — ABNORMAL LOW (ref 98–111)
Creatinine, Ser: 0.94 mg/dL (ref 0.44–1.00)
GFR, Estimated: >60 mL/min (ref >60)
Glucose, Bld: 162 mg/dL — ABNORMAL HIGH (ref 70–99)
Potassium: 3.8 mmol/L (ref 3.5–5.1)
Sodium: 137 mmol/L (ref 135–145)

## 2023-11-05 LAB — GLUCOSE, CAPILLARY
Glucose-Capillary: 135 mg/dL — ABNORMAL HIGH (ref 70–99)
Glucose-Capillary: 165 mg/dL — ABNORMAL HIGH (ref 70–99)
Glucose-Capillary: 95 mg/dL (ref 70–99)

## 2023-11-05 LAB — STREP PNEUMONIAE URINARY ANTIGEN: Strep Pneumo Urinary Antigen: NEGATIVE

## 2023-11-05 LAB — MAGNESIUM: Magnesium: 2.2 mg/dL (ref 1.7–2.4)

## 2023-11-05 MED ORDER — SODIUM CHLORIDE 0.9 % IV SOLN
1.0000 g | INTRAVENOUS | Status: DC
  Administered 2023-11-06: 1 g via INTRAVENOUS
  Filled 2023-11-05: qty 10

## 2023-11-05 MED ORDER — ALPRAZOLAM 0.5 MG PO TABS
0.5000 mg | ORAL_TABLET | Freq: Once | ORAL | Status: AC
  Administered 2023-11-05: 0.5 mg via ORAL
  Filled 2023-11-05: qty 1

## 2023-11-05 MED ORDER — GUAIFENESIN-DM 100-10 MG/5ML PO SYRP
5.0000 mL | ORAL_SOLUTION | ORAL | Status: DC | PRN
  Administered 2023-11-05 – 2023-11-09 (×11): 5 mL via ORAL
  Filled 2023-11-05 (×12): qty 5

## 2023-11-05 MED ORDER — SODIUM CHLORIDE 0.9% FLUSH
10.0000 mL | INTRAVENOUS | Status: DC | PRN
  Administered 2023-11-05: 10 mL

## 2023-11-05 MED ORDER — ALBUTEROL SULFATE (2.5 MG/3ML) 0.083% IN NEBU
2.5000 mg | INHALATION_SOLUTION | Freq: Four times a day (QID) | RESPIRATORY_TRACT | Status: DC | PRN
  Administered 2023-11-05 – 2023-11-08 (×2): 2.5 mg via RESPIRATORY_TRACT
  Filled 2023-11-05 (×3): qty 3

## 2023-11-05 MED ORDER — FUROSEMIDE 10 MG/ML IJ SOLN
40.0000 mg | Freq: Once | INTRAMUSCULAR | Status: AC
  Administered 2023-11-05: 40 mg via INTRAVENOUS
  Filled 2023-11-05: qty 4

## 2023-11-05 MED ORDER — SODIUM CHLORIDE 0.9 % IV SOLN
500.0000 mg | INTRAVENOUS | Status: AC
  Administered 2023-11-06: 500 mg via INTRAVENOUS
  Filled 2023-11-05: qty 5

## 2023-11-05 MED ORDER — DICLOFENAC SODIUM 1 % EX GEL
2.0000 g | Freq: Four times a day (QID) | CUTANEOUS | Status: DC | PRN
  Administered 2023-11-05: 2 g via TOPICAL
  Filled 2023-11-05: qty 100

## 2023-11-05 NOTE — Assessment & Plan Note (Addendum)
 Patient somnolent exam and was placed on high flow O2 during evaluation. Patient non-compliant with BiPAP overnight and desat into the 70s. Etiology likely multifactorial complicated by patients minimal respiratory reserve due to OSA/OHS. Considering pneumonia with risk factors for drug resistance, aspiration pneumonitis vs CHF exacerbation. Patient will continue on ceftriaxone  for coverage  - Continuous pulse ox - Continue Strict I/Os - continue BiPAP while sleeping/naps, wean as tolerated to O2goal of 88-92% - NPO while on BiPAP  - Completed azithromycin  (2/2-2/4) and will continue ceftriaxone  (2/2-6), consider broadening for MRSA and pseudomonas coverage if becomes febrile  - Continue holding oxycodone  and do not administer benzos, given respiratory status and altered mentation/somnolence throughout admission  - AM CBC, BMP  - Repeat CXR final read pending - VBG pending  - f/u Strep Urine Antigen and MRSA negative  -  Legionella pending

## 2023-11-05 NOTE — Plan of Care (Signed)

## 2023-11-05 NOTE — Progress Notes (Addendum)
 FMTS Attending Daily Note: Suzann Daring, MD  Team Pager [phone] Pager [phone]  I have seen and examined this patient, and reviewed their chart. I have discussed this patient with the resident. I agree with the resident's findings, assessment and care plan.  In addition to below   Patient sleeping on her oxygen  when I walked in this morning.  I discussed the utmost importance with her wearing her BiPAP.  She is alert but slightly somnolent.  Oriented to person and situation.  She then had increasing oxygen  requirement later in the morning.  Will obtain chest x-ray and VBG.  Suspect this is largely due to her BiPAP noncompliance.  If her respiratory status worsens overnight would add on cefepime  discontinue ceftriaxone  and consider CT for pulmonary embolism.  She has been on anticoagulation for her underlying atrial fibrillation.  We will avoid sedating medications.   Class III obesity COPD exacerbation has been ruled   Disposition: Pending clinical improvement likely returning to skilled nursing in several days       Daily Progress Note Intern Pager: [phone]  Patient name: Chelsea [name] Medical record number: [id number] Date of birth: [birth year] Age: 58 y.o. Gender: female  Primary Care Provider: Pcp, No Consultants: None  Code Status: Full Code  Pt Overview and Major Events to Date:  2/2 Admitted, started azithro and vanc, placed on BiPAP 2/3 Wean O2 as tolerable,  2/4 Patient non-compliant with BiPAP and required HF and BiPAP placement with RT    Assessment and Plan:  Chelsea [name] is a 58 y.o. female presenting with acute hypoxic respiratory failure. Differential for this patient's presentation of this includes pneumonia with risk factors for multidrug-resistant organism vs aspiration pneumonitis, CHF exacerbation, COPD excerebration, BiPAP noncompliance, less likely ACS. Patient was recently admitted for acute hypoxic respiratory failure and treated for CHF  exacerbation and CAP.  Assessment & Plan Acute respiratory failure with hypoxia and hypercarbia (HCC) Patient somnolent exam and was placed on high flow O2 during evaluation. Patient non-compliant with BiPAP overnight and desat into the 70s. Etiology likely multifactorial complicated by patients minimal respiratory reserve due to OSA/OHS. Considering pneumonia with risk factors for drug resistance, aspiration pneumonitis vs CHF exacerbation. Patient will continue on ceftriaxone  for coverage  - Continuous pulse ox - Continue Strict I/Os - continue BiPAP while sleeping/naps, wean as tolerated to O2goal of 88-92% - NPO while on BiPAP  - Completed azithromycin  (2/2-2/4) and will continue ceftriaxone  (2/2-6), consider broadening for MRSA and pseudomonas coverage if becomes febrile  - Continue holding oxycodone  and do not administer benzos, given respiratory status and altered mentation/somnolence throughout admission  - AM CBC, BMP  - Repeat CXR final read pending - VBG pending  - f/u Strep Urine Antigen and MRSA negative  -  Legionella pending  (HFpEF) heart failure with preserved ejection fraction [facility]) Patient with prior history and suspect exacerbation. She was given Lasix  while in the ED and has continued diuresis. Volume status is difficult to assess. 500 cc urinary output last 24 hours. Patient received Lasix  early this morning. - S/P lasix  40 mg IV AM  - Continue diuresis Lasix  40 mg IV x1 1500 PM  - Continue jardiance  25 mg   Chronic and Stable Problems:   A flutter: currently in NSR, continue amiodarone  200 mg daily, continue Eliquis  5 mg BID. Holding potassium supplementation currently due to K 3.8 COPD: continue home inhalers, Singulair  DM II: NPO, CBG at bedtime, Semglee  10 units BID and mSSI Anemia: continue iron   supplementation HLD: continue home atorvastatin  Hypothyroidism: continue home levothyroxine  150 mcg daily HLD: continue atorvastatin  20 mg at bedtime RLS: Hold home  ropinirole  0.5 mg in the setting of somnolence on admission and want to limit central acting agents  GERD: continue pantoprazole  40 mg daily Chronic pain holding home meds, consider restarting if mentation continues to improve   FEN/GI: NPO while wearing BiPAP   PPx: Home Eliquis    Dispo: Med surg   Subjective:  Patient nauseous and emesis this morning after breakfast. Difficult breathing and was placed on high flow oxygen  via RT   Objective: Temp:  [97.7 F (36.5 C)-98.8 F (37.1 C)] 97.7 F (36.5 C) (02/04 0410) Pulse Rate:  [73-104] 73 (02/04 0410) Resp:  [15-30] 16 (02/04 0410) BP: (102-125)/(52-80) 109/70 (02/04 0410) SpO2:  [90 %-100 %] 96 % (02/04 0410) FiO2 (%):  [60 %] 60 % (02/04 0214) Weight:  [152.4 kg] 152.4 kg (02/03 2339) Physical Exam: General: Somnolent, speaking in short sentences, with High Flow in place Obese  HEENT: atraumatic, MMM, no HFNC, MMM Cardiovascular: RRR, systolic ejection murmur heard best at LUSB Respiratory: diminished lung sounds with auscultation limited to body habitus  Gastrointestinal: obese, not TTP MSK: moves all extremities  Derm: chronic venous skin changes with erythema on bilateral lower extremities  Neuro: Ox3, no focal neurological deficits   Laboratory: Most recent CBC Lab Results  Component Value Date   WBC 8.8 11/05/2023   HGB 11.0 (L) 11/05/2023   HCT 37.6 11/05/2023   MCV 91.0 11/05/2023   PLT 258 11/05/2023   Most recent BMP    Latest Ref Rng & Units 11/05/2023    6:14 AM  BMP  Glucose 70 - 99 mg/dL 837   BUN 6 - 20 mg/dL 38   Creatinine 9.55 - 1.00 mg/dL 9.05   Sodium 864 - 854 mmol/L 137   Potassium 3.5 - 5.1 mmol/L 3.8   Chloride 98 - 111 mmol/L 91   CO2 22 - 32 mmol/L 33   Calcium  8.9 - 10.3 mg/dL 8.9     Other pertinent labs   Legionella pending Strep pneumo negative  MRSA negative  Imaging/Diagnostic Tests: Repeat CXR 2/4  Radiologist Impression: Formal read pending  My interpretation:  Cardiomegaly, with improved central vascular congestion, Bilateral opacities improved  Lenard Calin, MD 11/05/2023, 8:45 AM  PGY-1, [facility] Family Medicine, Psychiatry Resident  FPTS Intern pager: [phone], text pages welcome Secure chat group [facility] Teaching Service

## 2023-11-05 NOTE — TOC Initial Note (Signed)
 Transition of Care [facility]) - Initial/Assessment Note    Patient Details  Name: Chelsea [name] MRN: [id number] Date of Birth: 02-17-1966  Transition of Care [facility] CM/SW Contact:    Isaiah Public, LCSWA Phone Number: 11/05/2023, 3:26 PM  Clinical Narrative:                  CSW spoke with patient at bedside. Patient reports PTA she comes from [facility]. Patient confirmed plan is to return back to [facility] when medically ready for dc. Patient confirmed she has bipap at facility. All questions answered. No further questions reported at this time. CSW spoke with Kia with [facility] who confirmed patient is from [facility]. CSW will follow up with facility closer to patient being medically ready for dc. CSW will continue to follow.   Expected Discharge Plan: Skilled Nursing Facility Barriers to Discharge: Continued Medical Work up   Patient Goals and CMS Choice Patient states their goals for this hospitalization and ongoing recovery are:: SNF   Choice offered to / list presented to : Patient      Expected Discharge Plan and Services In-house Referral: Clinical Social Work     Living arrangements for the past 2 months: Skilled Nursing Facility                                      Prior Living Arrangements/Services Living arrangements for the past 2 months: Skilled Nursing Facility Lives with:: Facility Resident Patient language and need for interpreter reviewed:: Yes Do you feel safe going back to the place where you live?: Yes      Need for Family Participation in Patient Care: Yes (Comment) Care giver support system in place?: Yes (comment)   Criminal Activity/Legal Involvement Pertinent to Current Situation/Hospitalization: No - Comment as needed  Activities of Daily Living   ADL Screening (condition at time of admission) Independently performs ADLs?: No Does the patient have a NEW difficulty with bathing/dressing/toileting/self-feeding that is expected to last >3 days?: No  (Needs assist) Does the patient have a NEW difficulty with getting in/out of bed, walking, or climbing stairs that is expected to last >3 days?: No (hoyer lift) Does the patient have a NEW difficulty with communication that is expected to last >3 days?: No Is the patient deaf or have difficulty hearing?: No Does the patient have difficulty seeing, even when wearing glasses/contacts?: No Does the patient have difficulty concentrating, remembering, or making decisions?: No  Permission Sought/Granted Permission sought to share information with : Case Manager, Magazine Features Editor, Family Supports Permission granted to share information with : Yes, Verbal Permission Granted  Share Information with NAME: Dewayne  Permission granted to share info w AGENCY: SNF  Permission granted to share info w Relationship: significant other  Permission granted to share info w Contact Information: Dewayne [phone]  Emotional Assessment Appearance:: Appears stated age Attitude/Demeanor/Rapport: Gracious Affect (typically observed): Calm Orientation: : Oriented to Self, Oriented to Place, Oriented to  Time, Oriented to Situation Alcohol / Substance Use: Not Applicable    Admission diagnosis:  COPD exacerbation (HCC) [J44.1] Acute respiratory failure with hypoxia (HCC) [J96.01] HCAP (healthcare-associated pneumonia) [J18.9] Acute hypoxemic respiratory failure (HCC) [J96.01] Acute on chronic congestive heart failure, unspecified heart failure type [facility]) [I50.9] Patient Active Problem List   Diagnosis Date Noted   Acute on chronic hypoxic respiratory failure (HCC) 11/04/2023   Acute hypoxemic respiratory failure (HCC) 11/03/2023  Acute on chronic respiratory failure with hypoxia and hypercapnia (HCC) 10/03/2023   Type 2 diabetes mellitus with hyperglycemia (HCC) 10/03/2023   Acute on chronic respiratory failure with hypercapnia (HCC) 10/03/2023   Altered mental status 09/29/2023   Abnormal  CT scan of lung 09/22/2023   Urinary retention 09/20/2023   Acute hypoxic on chronic hypercapnic respiratory failure (HCC) 09/19/2023   Elevated serum creatinine 08/20/2023   Yeast infection 08/18/2023   Hypercapnemia 08/17/2023   T2DM (type 2 diabetes mellitus) (HCC) 08/16/2023   Acute hypoxic respiratory failure (HCC) 08/15/2023   Pain of left middle finger 06/22/2023   Intertrigo 06/22/2023   Hypokalemia 06/15/2023   Hyperphosphatemia 06/04/2023   Anxiety 05/30/2023   Atrial flutter with rapid ventricular response (HCC) 05/29/2023   AKI (acute kidney injury) (HCC) 05/29/2023   Arthritis 11/13/2022   Asthma 11/13/2022   Chronic lower back pain 11/13/2022   Complication of anesthesia 11/13/2022   Coronary artery disease 11/13/2022   (HFpEF) heart failure with preserved ejection fraction (HCC) 11/13/2022   Heart murmur 11/13/2022   History of gout 11/13/2022   Hypertension 11/13/2022   Migraine 11/13/2022   On home oxygen  therapy 11/13/2022   Chronic iron  deficiency anemia 09/18/2022   Chronic disease anemia 08/01/2022   Upper GI bleed 07/18/2022   COPD (chronic obstructive pulmonary disease) with chronic bronchitis (HCC) 07/18/2022   Acute blood loss anemia 07/18/2022   Bleeding of the respiratory tract 04/07/2022   Chronic respiratory failure with hypercapnia (HCC) 02/12/2022   Polyneuropathy, unspecified 02/12/2022   Restrictive lung disease secondary to obesity 01/28/2022   Limitation of activity due to disability 09/18/2021   Paroxysmal atrial fibrillation (HCC) 09/04/2021   DJD (degenerative joint disease) 07/03/2021   Pressure injury of both heels, unstageable (HCC) 07/03/2021   Venous insufficiency of both lower extremities 07/02/2021   Physical deconditioning 04/24/2021   Depression 04/20/2021   History of tobacco use 04/20/2021   Hyperglycemia due to type 2 diabetes mellitus (HCC) 04/20/2021   Irritable bowel syndrome with constipation 04/20/2021    Microalbuminuria 04/20/2021   On supplemental oxygen  by nasal cannula 04/20/2021   Renal insufficiency 04/20/2021   Moderate aortic stenosis 04/20/2021   Mild tricuspid regurgitation 04/02/2021   Mitral valve sclerosis 04/02/2021   Morbid obesity with BMI of 50.0-59.9, adult (HCC) 04/02/2021   Non-smoker 04/02/2021   Awaiting admission to adequate facility elsewhere 03/28/2021   Metabolic alkalosis 03/28/2021   Restless leg syndrome 03/22/2021   Weakness 03/20/2021   Hx of atrial flutter 03/15/2021   Weight loss 03/15/2021   S/P hysterectomy 02/19/2021   Pulmonary nodule less than 6 cm determined by computed tomography of lung 02/09/2021   Dyspnea on exertion 02/09/2021   Nocturnal hypoxemia 06/03/2020   Mixed hyperlipidemia 04/18/2020   Hair loss 09/17/2019   Chronic pain of multiple joints 06/18/2019   Onychomycosis 06/18/2019   Acute idiopathic gout of left hand 02/16/2019   Tinea pedis of both feet 11/12/2018   Hypertension associated with diabetes (HCC) 08/25/2018   Class 3 severe obesity due to excess calories with serious comorbidity and body mass index (BMI) of 60.0 to 69.9 in adult (HCC) 08/25/2018   Physical debility 05/10/2018   Hepatitis C    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   OSA on CPAP 02/24/2018   Acute exacerbation of chronic heart failure (HCC) 09/25/2017   Pneumonia due to gram-positive bacteria 08/24/2017   Acute respiratory failure with hypoxia and hypercarbia (HCC) 05/27/2016   Hyperlipidemia 09/28/2015  COPD (chronic obstructive pulmonary disease) (HCC) 03/04/2015   Bronchitis 05/30/2011   Neuropathic pain, leg 04/09/2011   Mononeuropathy of lower extremity 04/09/2011   Edema 03/22/2011   History of cervical cancer 03/22/2011   Lymphedema 03/22/2011   Obesity hypoventilation syndrome (HCC) 01/01/2011   Acute on chronic diastolic heart failure (HCC) 11/28/2010   Osteoarthritis 11/28/2010   Hypothyroidism 11/13/2010    Essential hypertension 11/10/2010   GERD 11/10/2010   Gastro-esophageal reflux disease without esophagitis 11/10/2010   Essential (primary) hypertension 11/10/2010   Type 2 diabetes mellitus without complication (HCC) 11/10/2010   Cervical cancer (HCC) 2006   PCP:  Pcp, No Pharmacy:   [facility] - [place], [place] - [address] [place] [address] Phone: [phone] Fax: [phone]     Social Drivers of Health (SDOH) Social History: SDOH Screenings   Food Insecurity: No Food Insecurity (11/04/2023)  Housing: Low Risk  (11/04/2023)  Transportation Needs: No Transportation Needs (11/04/2023)  Utilities: Not At Risk (11/04/2023)  Alcohol Screen: Low Risk  (07/05/2022)  Financial Resource Strain: Low Risk  (07/05/2022)  Physical Activity: Insufficiently Active (03/27/2021)   Received from [facility]  Social Connections: Unknown (02/04/2022)   Received from [facility]  Stress: No Stress Concern Present (09/15/2021)   Received from [facility]  Tobacco Use: Low Risk  (11/03/2023)   SDOH Interventions:     Readmission Risk Interventions    07/23/2022    2:19 PM 04/09/2022    4:17 PM  Readmission Risk Prevention Plan  Transportation Screening Complete Complete  PCP or Specialist Appt within 3-5 Days  Complete  HRI or Home Care Consult  Complete  Social Work Consult for Recovery Care Planning/Counseling  Complete  Palliative Care Screening  Not Applicable  Medication Review Oceanographer) Complete Referral to Pharmacy  PCP or Specialist appointment within 3-5 days of discharge Complete   HRI or Home Care Consult Complete   SW Recovery Care/Counseling Consult Complete   Palliative Care Screening Not Applicable   Skilled Nursing Facility Complete

## 2023-11-05 NOTE — Progress Notes (Signed)
   11/05/23 0214  BiPAP/CPAP/SIPAP  $ Non-Invasive Ventilator  Non-Invasive Vent Set Up  $ Face Mask Medium Yes  BiPAP/CPAP/SIPAP Pt Type Adult  BiPAP/CPAP/SIPAP SERVO  Mask Type Full face mask  Mask Size Medium  Set Rate 15 breaths/min  Respiratory Rate 23 breaths/min  Pressure Support 10 cmH20  PEEP 10 cmH20  FiO2 (%) 60 %  Minute Ventilation 8.5  Leak 44  Peak Inspiratory Pressure (PIP) 21  Tidal Volume (Vt) 437  Patient Home Equipment No  Auto Titrate No  Press High Alarm 30 cmH2O  CPAP/SIPAP surface wiped down Yes  BiPAP/CPAP /SiPAP Vitals  Pulse Rate 80  Resp (!) 22  SpO2 97 %  MEWS Score/Color  MEWS Score 1  MEWS Score Color Green

## 2023-11-05 NOTE — Plan of Care (Signed)
 FMTS Brief Progress Note  S: Patient seen at bedside with Dr. Elicia. Patient is wearing bipap.    O: BP 115/64 (BP Location: Right Arm)   Pulse 71   Temp 98.5 F (36.9 C) (Axillary)   Resp 20   Ht 5' 5 (1.651 m)   Wt (!) 152.4 kg   SpO2 98%   BMI 55.91 kg/m   General: A&O, NAD HEENT: No sign of trauma, EOM grossly intact, BiPAP in place GI: non-distended  Skin: chronic venous changes to bilateral lower extremities  Psych: Appropriate mood and affect   A/P: 58 yo F admitted for acute respirtoary failure with hypoxia and hypercarbia. Patient alert and oriented, wearing BiPAP. Is occasionally taking off BiPAP to spit. Discussed importance of BiPAP given VBG this PM showed pCO2 of 76.  - continue BiPAP - rest of plan per day team  - Orders reviewed. Labs for AM ordered, which was adjusted as needed.  - If condition changes, plan includes reevaluation and adjustment to plan as needed.   Lonnie Earnest, MD 11/05/2023, 8:41 PM PGY-1, [facility] Night Resident  Please page [phone] with questions.

## 2023-11-05 NOTE — Progress Notes (Signed)
Wrote Dr. Sherrilee Gilles and asked if patient needed and order for cardiac monitoring. Doctor said patient did not.

## 2023-11-05 NOTE — Assessment & Plan Note (Addendum)
 Patient with prior history and suspect exacerbation. She was given Lasix  while in the ED and has continued diuresis. Volume status is difficult to assess. 500 cc urinary output last 24 hours. Patient received Lasix  early this morning. - S/P lasix  40 mg IV AM  - Continue diuresis Lasix  40 mg IV x1 1500 PM  - Continue jardiance  25 mg

## 2023-11-06 ENCOUNTER — Inpatient Hospital Stay (HOSPITAL_COMMUNITY): Payer: Medicare Other

## 2023-11-06 ENCOUNTER — Other Ambulatory Visit: Payer: Self-pay

## 2023-11-06 DIAGNOSIS — I4892 Unspecified atrial flutter: Secondary | ICD-10-CM | POA: Diagnosis not present

## 2023-11-06 DIAGNOSIS — J9601 Acute respiratory failure with hypoxia: Secondary | ICD-10-CM

## 2023-11-06 DIAGNOSIS — Z6841 Body Mass Index (BMI) 40.0 and over, adult: Secondary | ICD-10-CM

## 2023-11-06 DIAGNOSIS — J9602 Acute respiratory failure with hypercapnia: Secondary | ICD-10-CM

## 2023-11-06 DIAGNOSIS — I5033 Acute on chronic diastolic (congestive) heart failure: Secondary | ICD-10-CM | POA: Diagnosis not present

## 2023-11-06 LAB — CBC
HCT: 31.8 % — ABNORMAL LOW (ref 36.0–46.0)
HCT: 37.2 % (ref 36.0–46.0)
Hemoglobin: 9.7 g/dL — ABNORMAL LOW (ref 12.0–15.0)
Hemoglobin: 11.1 g/dL — ABNORMAL LOW (ref 12.0–15.0)
MCH: 27.6 pg (ref 26.0–34.0)
MCH: 26.7 pg (ref 26.0–34.0)
MCHC: 30.5 g/dL (ref 30.0–36.0)
MCHC: 29.8 g/dL — ABNORMAL LOW (ref 30.0–36.0)
MCV: 90.6 fL (ref 80.0–100.0)
MCV: 89.6 fL (ref 80.0–100.0)
Platelets: 250 10*3/uL (ref 150–400)
Platelets: 252 10*3/uL (ref 150–400)
RBC: 3.51 MIL/uL — ABNORMAL LOW (ref 3.87–5.11)
RBC: 4.15 MIL/uL (ref 3.87–5.11)
RDW: 16.2 % — ABNORMAL HIGH (ref 11.5–15.5)
RDW: 15.6 % — ABNORMAL HIGH (ref 11.5–15.5)
WBC: 6.7 10*3/uL (ref 4.0–10.5)
WBC: 6.3 10*3/uL (ref 4.0–10.5)
nRBC: 0 % (ref 0.0–0.2)
nRBC: 0 % (ref 0.0–0.2)

## 2023-11-06 LAB — BASIC METABOLIC PANEL
Anion gap: 10 (ref 5–15)
Anion gap: 11 (ref 5–15)
BUN: 28 mg/dL — ABNORMAL HIGH (ref 6–20)
BUN: 22 mg/dL — ABNORMAL HIGH (ref 6–20)
CO2: 37 mmol/L — ABNORMAL HIGH (ref 22–32)
CO2: 38 mmol/L — ABNORMAL HIGH (ref 22–32)
Calcium: 8.5 mg/dL — ABNORMAL LOW (ref 8.9–10.3)
Calcium: 8.3 mg/dL — ABNORMAL LOW (ref 8.9–10.3)
Chloride: 91 mmol/L — ABNORMAL LOW (ref 98–111)
Chloride: 89 mmol/L — ABNORMAL LOW (ref 98–111)
Creatinine, Ser: 0.72 mg/dL (ref 0.44–1.00)
Creatinine, Ser: 0.78 mg/dL (ref 0.44–1.00)
GFR, Estimated: >60 mL/min (ref >60)
GFR, Estimated: >60 mL/min (ref >60)
Glucose, Bld: 127 mg/dL — ABNORMAL HIGH (ref 70–99)
Glucose, Bld: 167 mg/dL — ABNORMAL HIGH (ref 70–99)
Potassium: 3.5 mmol/L (ref 3.5–5.1)
Potassium: 3.9 mmol/L (ref 3.5–5.1)
Sodium: 138 mmol/L (ref 135–145)
Sodium: 138 mmol/L (ref 135–145)

## 2023-11-06 LAB — RESPIRATORY PANEL BY PCR

## 2023-11-06 LAB — GLUCOSE, CAPILLARY
Glucose-Capillary: 117 mg/dL — ABNORMAL HIGH (ref 70–99)
Glucose-Capillary: 118 mg/dL — ABNORMAL HIGH (ref 70–99)
Glucose-Capillary: 115 mg/dL — ABNORMAL HIGH (ref 70–99)
Glucose-Capillary: 115 mg/dL — ABNORMAL HIGH (ref 70–99)

## 2023-11-06 LAB — BLOOD GAS, VENOUS
Acid-Base Excess: 12.5 mmol/L — ABNORMAL HIGH (ref 0.0–2.0)
Bicarbonate: 42.2 mmol/L — ABNORMAL HIGH (ref 20.0–28.0)
O2 Saturation: 74.8 %
Patient temperature: 37
pCO2, Ven: 80 mm[Hg] (ref 44–60)
pH, Ven: 7.33 (ref 7.25–7.43)
pO2, Ven: 45 mm[Hg] (ref 32–45)

## 2023-11-06 LAB — MAGNESIUM
Magnesium: 2 mg/dL (ref 1.7–2.4)
Magnesium: 1.9 mg/dL (ref 1.7–2.4)

## 2023-11-06 MED ORDER — POTASSIUM CHLORIDE 10 MEQ/100ML IV SOLN
10.0000 meq | INTRAVENOUS | Status: AC
  Administered 2023-11-06 (×4): 10 meq via INTRAVENOUS
  Filled 2023-11-06 (×4): qty 100

## 2023-11-06 MED ORDER — POTASSIUM CHLORIDE 10 MEQ/100ML IV SOLN
10.0000 meq | INTRAVENOUS | Status: AC
Start: 2023-11-06 — End: 2023-11-07
  Administered 2023-11-06: 10 meq via INTRAVENOUS
  Filled 2023-11-06 (×2): qty 100

## 2023-11-06 MED ORDER — INSULIN GLARGINE-YFGN 100 UNIT/ML ~~LOC~~ SOLN
5.0000 [IU] | Freq: Two times a day (BID) | SUBCUTANEOUS | Status: DC
  Administered 2023-11-07: 5 [IU] via SUBCUTANEOUS
  Filled 2023-11-06 (×3): qty 0.05

## 2023-11-06 MED ORDER — FUROSEMIDE 10 MG/ML IJ SOLN
80.0000 mg | Freq: Once | INTRAMUSCULAR | Status: AC
  Administered 2023-11-06: 80 mg via INTRAVENOUS
  Filled 2023-11-06: qty 8

## 2023-11-06 MED ORDER — VANCOMYCIN HCL 2000 MG/400ML IV SOLN
2000.0000 mg | INTRAVENOUS | Status: DC
  Filled 2023-11-06: qty 400

## 2023-11-06 MED ORDER — AMIODARONE HCL IN DEXTROSE 360-4.14 MG/200ML-% IV SOLN
60.0000 mg/h | INTRAVENOUS | Status: DC
  Administered 2023-11-06 (×2): 60 mg/h via INTRAVENOUS
  Filled 2023-11-06: qty 200

## 2023-11-06 MED ORDER — SODIUM CHLORIDE 0.9 % IV SOLN
2.0000 g | Freq: Three times a day (TID) | INTRAVENOUS | Status: DC
  Administered 2023-11-06 – 2023-11-07 (×3): 2 g via INTRAVENOUS
  Filled 2023-11-06 (×3): qty 12.5

## 2023-11-06 MED ORDER — HEPARIN (PORCINE) 25000 UT/250ML-% IV SOLN
2350.0000 [IU]/h | INTRAVENOUS | Status: DC
  Administered 2023-11-06: 1400 [IU]/h via INTRAVENOUS
  Administered 2023-11-07: 1700 [IU]/h via INTRAVENOUS
  Administered 2023-11-08: 2200 [IU]/h via INTRAVENOUS
  Administered 2023-11-08: 1900 [IU]/h via INTRAVENOUS
  Administered 2023-11-09: 2350 [IU]/h via INTRAVENOUS
  Administered 2023-11-09: 2400 [IU]/h via INTRAVENOUS
  Administered 2023-11-10: 2350 [IU]/h via INTRAVENOUS
  Filled 2023-11-06 (×7): qty 250

## 2023-11-06 MED ORDER — MAGNESIUM SULFATE 2 GM/50ML IV SOLN
2.0000 g | Freq: Once | INTRAVENOUS | Status: AC
  Administered 2023-11-06: 2 g via INTRAVENOUS
  Filled 2023-11-06: qty 50

## 2023-11-06 MED ORDER — DILTIAZEM HCL-DEXTROSE 125-5 MG/125ML-% IV SOLN (PREMIX)
5.0000 mg/h | INTRAVENOUS | Status: DC
  Administered 2023-11-06: 15 mg/h via INTRAVENOUS
  Administered 2023-11-06: 5 mg/h via INTRAVENOUS
  Administered 2023-11-07: 15 mg/h via INTRAVENOUS
  Filled 2023-11-06 (×5): qty 125

## 2023-11-06 MED ORDER — VANCOMYCIN HCL 10 G IV SOLR
2500.0000 mg | Freq: Once | INTRAVENOUS | Status: AC
  Administered 2023-11-06: 2500 mg via INTRAVENOUS
  Filled 2023-11-06: qty 2500

## 2023-11-06 MED ORDER — AMIODARONE HCL IN DEXTROSE 360-4.14 MG/200ML-% IV SOLN
30.0000 mg/h | INTRAVENOUS | Status: DC
  Administered 2023-11-07 – 2023-11-10 (×9): 30 mg/h via INTRAVENOUS
  Filled 2023-11-06 (×9): qty 200

## 2023-11-06 MED ORDER — AMIODARONE LOAD VIA INFUSION
150.0000 mg | Freq: Once | INTRAVENOUS | Status: AC
  Administered 2023-11-06: 150 mg via INTRAVENOUS
  Filled 2023-11-06: qty 83.34

## 2023-11-06 MED ORDER — AMIODARONE IV BOLUS ONLY 150 MG/100ML
150.0000 mg | Freq: Once | INTRAVENOUS | Status: AC
  Administered 2023-11-06: 150 mg via INTRAVENOUS
  Filled 2023-11-06: qty 100

## 2023-11-06 NOTE — Progress Notes (Signed)
 Red MEWS implemented

## 2023-11-06 NOTE — TOC Progression Note (Signed)
 Transition of Care [facility]) - Progression Note    Patient Details  Name: Chelsea [name] MRN: [id number] Date of Birth: 1965/12/28  Transition of [facility]) CM/SW Contact  Isaiah Public, LCSWA Phone Number: 11/06/2023, 12:04 PM  Clinical Narrative:     Patient is from [facility] long term. Patients plan is to return back to [facility] when medically ready for dc. CSW will continue to follow.  Expected Discharge Plan: Skilled Nursing Facility Barriers to Discharge: Continued Medical Work up  Expected Discharge Plan and Services In-house Referral: Clinical Social Work     Living arrangements for the past 2 months: Skilled Nursing Facility                                       Social Determinants of Health (SDOH) Interventions SDOH Screenings   Food Insecurity: No Food Insecurity (11/04/2023)  Housing: Low Risk  (11/04/2023)  Transportation Needs: No Transportation Needs (11/04/2023)  Utilities: Not At Risk (11/04/2023)  Alcohol Screen: Low Risk  (07/05/2022)  Financial Resource Strain: Low Risk  (07/05/2022)  Physical Activity: Insufficiently Active (03/27/2021)   Received from [facility], [facility]  Social Connections: Unknown (02/04/2022)   Received from [facility], [facility]  Stress: No Stress Concern Present (09/15/2021)   Received from [facility], [facility]  Tobacco Use: Low Risk  (11/03/2023)    Readmission Risk Interventions    07/23/2022    2:19 PM 04/09/2022    4:17 PM  Readmission Risk Prevention Plan  Transportation Screening Complete Complete  PCP or Specialist Appt within 3-5 Days  Complete  HRI or Home Care Consult  Complete  Social Work Consult for Recovery Care Planning/Counseling  Complete  Palliative Care Screening  Not Applicable  Medication Review [employer]) Complete Referral to Pharmacy  PCP or Specialist appointment within 3-5 days of discharge Complete   HRI or Home Care Consult Complete   SW Recovery Care/Counseling Consult  Complete   Palliative Care Screening Not Applicable   Skilled Nursing Facility Complete

## 2023-11-06 NOTE — Plan of Care (Signed)
 FMTS Interim Progress Note  Patient assessed at bedside given ongoing heart rate 140s per RN.  Patient resting comfortably with BiPAP in place, repeatedly requesting for removal to have some water .  Denies chest pain and worsening shortness of breath.  Mild palpitations but otherwise feels okay.  O: BP 103/72 (BP Location: Right Arm)   Pulse (!) 137   Temp (!) 100.4 F (38 C) (Axillary)   Resp 20   Ht 5' 5 (1.651 m)   Wt (!) 152.4 kg   SpO2 95%   BMI 55.91 kg/m    General: Chronically ill-appearing lying in bed with BiPAP in place.  NAD. CV: Heart rate in 140s, irregularly irregular rhythm.  4/6 systolic murmur. Pulm: Limited due to body habitus Abdomen: Soft, non-tender  A/P: A flutter with RVR HR in 140s despite max dilt drip, cardiology has been consulted and appreciate their assistance with further rate control.  Unfortunately patient may be a poor candidate for multiple therapies due to underlying chronic lung disease and body habitus. -Continue dilt drip -Defer additional management to cardiology recommendations  Acute hypoxic respiratory failure secondary to severe OHS/OSA Consulted pulmonology, patient is well-known to their service with numerous hospitalizations including ICU stays.  Unfortunately patient has such severe underlying pulmonary disease that her only option is tracheostomy, she has been resistant to this in the past.  Pulm will hold off on formally consulting unless patient is considering tracheostomy. -Discussed tracheostomy further with the patient, not a good candidate at this time due to a flutter with RVR but will broach this hospitalization -Maintain BiPAP with NPO to prevent additional cardiac strain   Theophilus Pagan, MD 11/06/2023, 2:35 PM PGY-2, [facility] Family [facility] Service pager [phone]

## 2023-11-06 NOTE — Plan of Care (Signed)
 FMTS Interim Progress Note  A/P: Repeat VBG shows stably elevated CO2. Notified by nurse that pt had briefly taken her mask back off, but they will replace her mask. Previously emphasized to pt to keep bipap on.  Elicia Hamlet, MD 11/06/2023, 12:02 AM PGY-2, [facility] Service pager [phone]

## 2023-11-06 NOTE — Consult Note (Signed)
 Cardiology Consultation   Patient ID: Chelsea [name] MRN: [id number]; DOB: 22-May-1966  Admit date: 11/03/2023 Date of Consult: 11/06/2023  PCP:  Freddrick No   [facility] Providers Cardiologist:  Oneil Parchment, MD   {   Patient Profile:   Chelsea [name] is a 58 y.o. female with a history of chronic HFpEF, paroxysmal atrial fibrillation/ flutter on Eliquis , moderate aortic stenosis, COPD with chronic respiratory respiratory failure on home O2, obstructive sleep apnea/ obesity hypoventilation syndrome on BiPAP,  hypertension, hyperlipidemia, type 2 diabetes mellitus, GERD, and morbid obesity with BMI >50  who is being seen 11/06/2023 for the evaluation of atrial flutter at the request of Dr. Delores.  History of Present Illness:   [name] is a 58 year old female with the above history who is followed by Dr. Parchment.  Patient has had multiple admissions over the years for acute on chronic hypoxic and hypercapnic cardiac respiratory failure secondary to obstructive sleep apnea/obesity hypoventilation syndrome and volume overload. She actually required a tracheostomy for several months in 2023. She is severely deconditioned from all these hospitalization and has been wheelchair bound for at least the last year. She has not been seen in our office since 10/2022.  Since then, she has had multiple admissions since then and 5 admissions since 06/2023 alone. Last Echo in 05/2023 showed LVEF of 60-65% with normal wall motion and diastolic parameters, normal RV size and function, and moderate aortic stenosis. She was most recently admitted from 09/28/2024 to 10/07/2023 for acute metabolic encephalopathy likely secondary to CO2 narcosis and acute on chronic hypoxic and hypercarbic respiratory failure. She required intubation during that admission and was diuresed. Hospitalization was complicated by undifferentiated shock requiring pressures. Urine and blood cultures were negative for infectious  sources. She was treated with antibiotics. She was discharged back on 5L of home O2 and PCCM recommended BiPAP with naps during the day.  She presented to the ED on 11/03/2023 for acute respiratory distress after staff at SNF found her without her O2 on. O2 sats were initially in the 70s on room air. She was placed on NRB and O2 sats improved to 94%. Upon arrival to the ED, patient was tachypneic and febrile with temp of 101.1. EKG showed normal sinus rhythm with no acute ischemic changes. High-sensitivity troponin negative. BNP elevated at 411. Chest x-ray showed enlarged cardiomediastinal silhouette with central congestion and heterogenous bilateral opacities (right > left) suspicious for infection over edema. WBC 13.4, Hgb 11.9, Plts 212. Na 136, K 4.2, Glucose 218, BUN 27, Cr 0.94. Lactic acid  1.1. Respiratory panel negative for COVID, influenza, or RSV. Venous blood gass showed pH of 7.35, pCO2 73, pO2 116, and Bicarb of 40.3. She was started on antibiotics and has been given IV Lasix  as well.  Patient was noted to be tachycardic this morning with rates in the 130s. EKG looks like atrial flutter, rate 131 bpm, with ST elevation in aVR and diffuse ST depression (suspect rate related). She was started on IV Diltiazem  and Cardiology consulted.   At this time as evaluation, patient is on BiPAP.  However, while talking RN came in and remove BiPAP per.  Per primary team okay to take a 30-minute break but then will go back on BiPAP.  She states she was doing okay since recent discharge until the evening of 11/02/2023 when she did having GI symptoms.  She reports nausea, vomiting, and diarrhea.  She was not wearing her BiPAP machine due to vomiting and this  is when her O2 sats dropped.  She denies any worsening shortness of breath compared to her baseline prior to this.  She is on 5 L of O2 at home.  She has chronic orthopnea and lower extremity edema but this is stable. She has some chronic skin changes/  hyperpigmentation of her lower extremities consistent with chronic venous insufficiency.  She denies any chest pain or abdominal pain.  She does report some heart racing right now while in rapid atrial flutter. She reports a productive cough but no nasal congestion. Nausea/ vomiting have resolved but she continues to have some soft stools. She denies any abnormal bleeding including hemoptysis, hematemesis, melena, or hematochezia.   Past Medical History:  Diagnosis Date   Acquired hypothyroidism 11/13/2010   Qualifier: Diagnosis of   By: Pietro, MD, CODY Redell Dimes      Acute congestive heart failure (HCC) 03/18/2021   Acute idiopathic gout of left hand 02/16/2019   Acute kidney injury (HCC) 05/29/2023   Acute metabolic encephalopathy 09/29/2023   Acute on chronic diastolic CHF (congestive heart failure), NYHA class 3 (HCC) 09/25/2017   Acute on chronic respiratory failure with hypoxia and hypercapnia (HCC) 04/07/2022   Arthritis    qwhere (12/05/2017)   Asthma    Atrial flutter (HCC) 03/09/2021   Bleeding of the respiratory tract 04/07/2022   Bronchitis 05/30/2011   Cervical cancer (HCC) 2006   CHF exacerbation (HCC) 07/30/2022   Chronic heart failure with preserved ejection fraction (HCC) 02/23/2018   Chronic lower back pain    Chronic respiratory failure with hypoxia (HCC) 07/02/2018   ABG 11/2017 7.36/69   Consistent with obesity hypoventilation syndrome   Complication of anesthesia    I have a hard time waking up from under it (12/05/2017)   COPD (chronic obstructive pulmonary disease) (HCC) 03/04/2015   Coronary artery disease    Cough productive of clear sputum 06/03/2023   Diarrhea 06/18/2019   DJD (degenerative joint disease) 07/03/2021   DM2 (diabetes mellitus, type 2) (HCC) 11/10/2010   Dyspnea 12/05/2017   Dyspnea on exertion 02/09/2021   Edema 03/22/2011   Essential (primary) hypertension 11/10/2010   Formatting of this note might be different from the original.   Overview:   Qualifier: Diagnosis of   By: Gwenn Grimes      Last Assessment & Plan:   Watch blood pressure off of minoxidil and add medications as needed.   Essential hypertension 11/10/2010   Qualifier: Diagnosis of   By: Gwenn Grimes       Gastro-esophageal reflux disease without esophagitis 11/10/2010   GERD 11/10/2010   Qualifier: Diagnosis of   By: Gwenn Grimes       Hair loss 09/17/2019   Heart failure (HCC) 04/20/2021   Heart murmur    Hepatitis C    History of cervical cancer 03/22/2011   History of gout X 1   History of tobacco use 04/20/2021   Hx of atrial flutter 03/15/2021   Hyperglycemia due to type 2 diabetes mellitus (HCC) 04/20/2021   Hyperlipidemia    Hypertension associated with diabetes (HCC) 08/25/2018   Irritable bowel syndrome with diarrhea 06/18/2019   Limitation of activity due to disability 09/18/2021   Lymphedema of both lower extremities 03/22/2011   Metabolic alkalosis 03/28/2021   Microalbuminuria 04/20/2021   Migraine    monthly (12/05/2017)   Mild tricuspid regurgitation 04/02/2021   Mitral valve sclerosis 04/02/2021   Mixed hyperlipidemia 04/18/2020   Moderate aortic stenosis 04/20/2021   Mononeuropathy of lower  extremity 04/09/2011   Formatting of this note might be different from the original.  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  Severe, corrected by CPAP 18 with C-flex of 3  2/13 bipap 20/14 sm ff mask h/h biflex+3cm  Formatting of this note might be different from the original.  Sleep study 06/2020: IMPRESSIONS  - Moderate obstructive sleep apnea occurred during this study (AHI = 17.7/h), mostly REM related  - No significant central sleep apnea occurred during   On home oxygen  therapy    2L all the time (12/05/2017)   On supplemental oxygen  by nasal cannula 04/20/2021   Onychomycosis 06/18/2019   OSA treated with BiPAP    have CPAP at home too; wearing BiPAP right now (12/05/2017)   Osteoarthritis 11/28/2010   Qualifier: Diagnosis of   By: Lelon RIGGERS, Scott       Other long term (current) drug therapy 02/12/2022   Paroxysmal atrial fibrillation (HCC) 09/04/2021   Physical debility 05/10/2018   Formatting of this note might be different from the original.  Due to morbid obesity   Physical deconditioning 04/24/2021   Pneumonia    several times (12/05/2017)   Pneumonia due to gram-positive bacteria 08/24/2017   Pressure injury of both heels, unstageable (HCC) 07/03/2021   Pulmonary edema, acute (HCC) 02/21/2022   Renal insufficiency 04/20/2021   Restless leg syndrome 03/22/2021   Restrictive lung disease secondary to obesity 01/28/2022   S/P hysterectomy 02/19/2021   Tinea pedis of both feet 11/12/2018   Unspecified atrial flutter (HCC) 02/12/2022   Upper GI bleed 07/18/2022   Venous insufficiency of both lower extremities 07/02/2021   Weakness 03/20/2021   Weight loss 03/15/2021    Past Surgical History:  Procedure Laterality Date   ABDOMINAL SURGERY  2006 X 2   TAH incision was infected   BIOPSY  07/21/2022   Procedure: BIOPSY;  Surgeon: Elicia Claw, MD;  Location: [facility];  Service: Gastroenterology;;   CHOLECYSTECTOMY OPEN     ESOPHAGOGASTRODUODENOSCOPY N/A 07/21/2022   Procedure: ESOPHAGOGASTRODUODENOSCOPY (EGD);  Surgeon: Elicia Claw, MD;  Location:  [facility];  Service: Gastroenterology;  Laterality: N/A;   TONSILLECTOMY     TOTAL ABDOMINAL HYSTERECTOMY  2006   TRACHEOSTOMY REVISION N/A 04/09/2022   Procedure: CONTROL OF BLEEDING,TRACHEOSTOMY SITE;  Surgeon: Carlie Clark, MD;  Location: [facility];  Service: ENT;  Laterality: N/A;     Home Medications:  Prior to Admission medications   Medication Sig Start Date End Date Taking? Authorizing Provider  albuterol  (PROVENTIL ) (2.5 MG/3ML) 0.083% nebulizer solution Take 3 mLs (2.5 mg total) by nebulization every 6 (six) hours as needed for wheezing or shortness of breath (I50.32). 07/04/16  Yes Jude Harden GAILS, MD  ALPRAZolam  (XANAX ) 0.5 MG tablet Take 0.5 mg by mouth 2 (two) times daily as needed for anxiety. 09/05/23  Yes [provider]  amiodarone  (  PACERONE ) 200 MG tablet Take 1 tablet (200 mg total) by mouth daily. 06/21/23  Yes Bryan Bianchi, MD  atorvastatin  (LIPITOR) 20 MG tablet Take 20 mg by mouth every evening.   Yes [provider]  buPROPion  ER (WELLBUTRIN  SR) 100 MG 12 hr tablet Take 1 tablet (100 mg total) by mouth 2 (two) times daily. 06/08/23  Yes Bryan Bianchi, MD  busPIRone  (BUSPAR ) 7.5 MG tablet Take 1 tablet (7.5 mg total) by mouth 2 (two) times daily. 10/07/23  Yes Howell Lunger, DO  Cholecalciferol  125 MCG (5000 UT) TABS Take 1 tablet (5,000 Units total) by mouth daily. 07/10/22  Yes [name], Donalda HERO, MD  diclofenac  Sodium (VOLTAREN ) 1 % GEL Apply 2-4 g topically in the morning and at bedtime. Apply to bilateral knees/L shoulder topically twice a day for arthritis 4g dose lower extremities, 2g dose upper extremities   Yes [provider]  ELIQUIS  5 MG TABS tablet Take 5 mg by mouth 2 (two) times daily. 04/12/21  Yes [provider]  empagliflozin  (JARDIANCE ) 25 MG TABS tablet Take 1 tablet (25 mg total) by mouth daily. 08/20/23  Yes Alba Sharper, MD  ferrous sulfate  325 (65 FE) MG EC tablet Take 325 mg by mouth daily.   Yes [provider]  Fluticasone -Umeclidin-Vilant (TRELEGY ELLIPTA ) 100-62.5-25 MCG/ACT AEPB Inhale 1 puff into the lungs daily. 10/04/23  Yes Christia Budds, MD  insulin  glargine (LANTUS ) 100 UNIT/ML Solostar Pen Inject 35 Units into the skin in the morning. 10/04/23  Yes Jagadish, Mayuri, MD  insulin  glargine-yfgn (SEMGLEE ) 100 UNIT/ML injection Inject 0.4 mLs (40 Units total) into the skin at bedtime. 10/06/23  Yes Marlee Lynwood NOVAK, MD  levothyroxine  (SYNTHROID ) 150 MCG tablet Take 1 tablet (150 mcg total) by mouth daily at 6 (six) AM. Patient taking differently: Take 150 mcg by mouth daily. 07/10/22  Yes [name], Donalda HERO, MD  loperamide  (IMODIUM ) 2 MG capsule Take 2 mg by mouth every 4 (four) hours as needed for diarrhea or loose stools.   Yes [provider]  montelukast  (SINGULAIR ) 10 MG tablet Take 10 mg by mouth at bedtime.   Yes [provider]  Multiple Vitamin (MULTIVITAMIN WITH MINERALS) TABS tablet Take 1 tablet by mouth daily.   Yes [provider]  NON FORMULARY Apply BIPAP every night at bedtime.   Yes [provider]  omeprazole  (PRILOSEC) 20 MG capsule Take 20 mg by mouth daily. 10/02/22  Yes [provider]  oxyCODONE -acetaminophen  (PERCOCET) 10-325 MG tablet Take 1 tablet by mouth every 6 (six) hours. 06/21/23  Yes Bryan Bianchi, MD  OXYGEN  Inhale 5 L/min into the lungs at bedtime. Connect to BIPAP qhs   Yes [provider]  potassium chloride  SA (KLOR-CON  M) 20 MEQ tablet Take 2 tablets (40 mEq total) by mouth daily. 06/21/23  Yes Bryan Bianchi, MD  rOPINIRole  (REQUIP ) 0.5 MG tablet Take 1 tablet (0.5 mg total) by mouth at bedtime. 06/21/23  Yes Bryan Bianchi, MD  torsemide  (DEMADEX ) 20 MG tablet Take 60 mg by mouth 2 (two) times daily.   Yes [provider]  TRULICITY 1.5 MG/0.5ML SOAJ Inject 1.5 mg into the skin every Saturday. 10/31/23  Yes [provider]  loratadine  (CLARITIN ) 10 MG tablet Take 1 tablet (10 mg  total) by mouth daily. Patient not taking: Reported on 11/04/2023 10/04/23   Jagadish, Mayuri, MD    Inpatient Medications: Scheduled Meds:  amiodarone   150 mg Intravenous Once   apixaban   5 mg Oral BID  atorvastatin   20 mg Oral QPM   buPROPion  ER  100 mg Oral BID   busPIRone   7.5 mg Oral BID   ferrous sulfate   325 mg Oral Daily   fluticasone  furoate-vilanterol  1 puff Inhalation Daily   And   umeclidinium bromide   1 puff Inhalation Daily   insulin  aspart  0-15 Units Subcutaneous TID WC   insulin  glargine-yfgn  5 Units Subcutaneous BID   levothyroxine   150 mcg Oral Q0600   loratadine   10 mg Oral Daily   montelukast   10 mg Oral QHS   pantoprazole   40 mg Oral Daily   Continuous Infusions:  amiodarone      Followed by   amiodarone      cefTRIAXone  (ROCEPHIN )  IV Stopped (11/06/23 0041)   diltiazem  (CARDIZEM ) infusion 15 mg/hr (11/06/23 1200)   potassium chloride  10 mEq (11/06/23 1541)   PRN Meds: acetaminophen , albuterol , diclofenac  Sodium, guaiFENesin -dextromethorphan , sodium chloride  flush  Allergies:    Allergies  Allergen Reactions   Iodinated Contrast Media Anaphylaxis   Penicillins Anaphylaxis    Patient tolerates cefepime  (08/2017)   Insulin  Lispro Other (See Comments)    Adder per MAR from SNF   Minoxidil Hives    Social History:   Social History   Socioeconomic History   Marital status: Significant Other    Spouse name: Not on file   Number of children: Not on file   Years of education: Not on file   Highest education level: Not on file  Occupational History   Occupation: disabled  Tobacco Use   Smoking status: Never   Smokeless tobacco: Never  Vaping Use   Vaping status: Never Used  Substance and Sexual Activity   Alcohol use: Not Currently   Drug use: No   Sexual activity: Not Currently  Other Topics Concern   Not on file  Social History Narrative   Not on file   Social Drivers of Health   Financial Resource Strain: Low Risk  (07/05/2022)    Overall Financial Resource Strain (CARDIA)    Difficulty of Paying Living Expenses: Not very hard  Food Insecurity: No Food Insecurity (11/04/2023)   Hunger Vital Sign    Worried About Running Out of Food in the Last Year: Never true    Ran Out of Food in the Last Year: Never true  Transportation Needs: No Transportation Needs (11/04/2023)   PRAPARE - Administrator, Civil Service (Medical): No    Lack of Transportation (Non-Medical): No  Physical Activity: Insufficiently Active (03/27/2021)   Received from [facility], [facility]   Exercise Vital Sign    Days of Exercise per Week: 4 days    Minutes of Exercise per Session: 30 min  Stress: No Stress Concern Present (09/15/2021)   Received from [facility], [facility] - Occupational Stress Questionnaire    Feeling of Stress : Only a little  Social Connections: Unknown (02/04/2022)   Received from [facility], [facility]   Social Network    Social Network: Not on file  Intimate Partner Violence: Not At Risk (11/04/2023)   Humiliation, Afraid, Rape, and Kick questionnaire    Fear of Current or Ex-Partner: No    Emotionally Abused: No    Physically Abused: No    Sexually Abused: No    Family History:   Family History  Problem Relation Age of Onset   Heart disease Father      ROS:  Please see the history  of present illness.     Physical Exam/Data:   Vitals:   11/06/23 0943 11/06/23 1005 11/06/23 1231 11/06/23 1433  BP:  110/78 107/72 103/72  Pulse: (!) 135 (!) 137    Resp: (!) 25 20    Temp:   99.5 F (37.5 C) (!) 100.4 F (38 C)  TempSrc:   Axillary Axillary  SpO2: 99% 95%    Weight:      Height:        Intake/Output Summary (Last 24 hours) at 11/06/2023 1544 Last data filed at 11/06/2023 1200 Gross per 24 hour  Intake 448.07 ml  Output 1350 ml  Net -901.93 ml      11/04/2023   11:39 PM 11/04/2023    8:32 PM 11/03/2023    7:22 PM  Last 3 Weights  Weight  (lbs) 335 lb 15.7 oz 335 lb 15.7 oz 323 lb  Weight (kg) 152.4 kg 152.4 kg 146.512 kg     Body mass index is 55.91 kg/m.  General: 58 y.o. morbidly obese Caucasian female. Resting comfortably on BiPAP. HEENT: Normocephalic and atraumatic. Sclera clear.  Neck: Supple. JVD difficult to assess due to body habitus. Heart: Tachycardic with irregular rhythm. No murmurs, gallops, or rubs. Lungs: On BiPAP. Decreased breaths bilaterally. Coarse breaths sounds with some expiratory wheezing. No significant crackles appreciated. Abdomen: Soft, obese/ distended, and non-tender. MSK: Generalized weakness. Extremities: 1+ pitting edema of bilateral lower extremities.   Skin: Warm and dry. Hyperpigmentation and chronic skin changes of bilateral lower extremities consistent with chronic venous insufficiency.  Neuro: Alert and oriented x3. No focal deficits. Psych: Normal affect. Responds appropriately.  EKG:  The EKG was personally reviewed and demonstrates:  Initial on 11/03/2023 EKG showed normal sinus rhythm, 93 bpm, with no acute ischemic changes compared to prior tracings. Repeat EKG on 11/06/2023 showed atrial flutter, rate 131 bpm, with ST elevation in aVR and diffuse ST depression (suspect rate related). Telemetry:  Telemetry was personally reviewed and demonstrates:  Atrial flutter with rates in the 130s to 140s.  Relevant CV Studies:  Echocardiogram 06/01/2023: Impressions:  1. Left ventricular ejection fraction, by estimation, is 60 to 65%. The  left ventricle has normal function. The left ventricle has no regional  wall motion abnormalities. Left ventricular diastolic parameters were  normal.   2. Right ventricular systolic function is normal. The right ventricular  size is normal.   3. Left atrial size was mild to moderately dilated.   4. The mitral valve is degenerative. No evidence of mitral valve  regurgitation.   5. The aortic valve is calcified. Aortic valve regurgitation is trivial.   Moderate aortic valve stenosis.   6. The inferior vena cava is dilated in size with >50% respiratory  variability, suggesting right atrial pressure of 8 mmHg.   Comparison(s): No significant change from prior study.    Laboratory Data:  High Sensitivity Troponin:   Recent Labs  Lab 11/04/23 2046  TROPONINIHS 11     Chemistry Recent Labs  Lab 11/04/23 2046 11/05/23 0614 11/06/23 0545  NA 138 137 138  K 4.2 3.8 3.5  CL 91* 91* 91*  CO2 34* 33* 37*  GLUCOSE 221* 162* 127*  BUN 33* 38* 28*  CREATININE 1.02* 0.94 0.72  CALCIUM  9.0 8.9 8.5*  MG 2.4 2.2 2.0  GFRNONAA >60 >60 >60  ANIONGAP 13 13 10     Recent Labs  Lab 11/05/23 0614  PROT 7.2  ALBUMIN  2.8*  AST 13*  ALT 12  ALKPHOS 83  BILITOT 0.4   Lipids No results for input(s): CHOL, TRIG, HDL, LABVLDL, LDLCALC, CHOLHDL in the last 168 hours.  Hematology Recent Labs  Lab 11/04/23 0526 11/05/23 0614 11/06/23 0415  WBC 10.1 8.8 6.7  RBC 4.41 4.13 3.51*  HGB 11.9* 11.0* 9.7*  HCT 39.8 37.6 31.8*  MCV 90.2 91.0 90.6  MCH 27.0 26.6 27.6  MCHC 29.9* 29.3* 30.5  RDW 15.9* 15.9* 16.2*  PLT 210 258 250   Thyroid No results for input(s): TSH, FREET4 in the last 168 hours.  BNP Recent Labs  Lab 11/03/23 1951  BNP 411.3*    DDimer No results for input(s): DDIMER in the last 168 hours.   Radiology/Studies:  DG CHEST PORT 1 VIEW Result Date: 11/05/2023 CLINICAL DATA:  Hypoxia. EXAM: PORTABLE CHEST 1 VIEW COMPARISON:  Chest radiograph dated 11/03/2023. FINDINGS: Cardiomegaly with mild central vascular congestion. No focal consolidation, pleural effusion, or pneumothorax. Overall improved aeration of the lungs and decrease in bilateral streaky density seen on the prior radiograph. No acute osseous pathology. IMPRESSION: Cardiomegaly with mild central vascular congestion. Electronically Signed   By: Vanetta Chou M.D.   On: 11/05/2023 16:31   DG Chest Portable 1 View Result Date:  11/03/2023 CLINICAL DATA:  Shortness of breath EXAM: PORTABLE CHEST 1 VIEW COMPARISON:  10/01/2023, 07/29/2022 FINDINGS: Enlarged cardiomediastinal silhouette. Heterogeneous bilateral opacities right greater than left. No pleural effusion or pneumothorax. Aortic atherosclerosis IMPRESSION: Enlarged cardiomediastinal silhouette with central congestion. Heterogeneous bilateral opacities right greater than left, the suspect infection over edema. Electronically Signed   By: Luke Bun M.D.   On: 11/03/2023 20:27     Assessment and Plan:   Atrial Flutter with RVR Patient has a history of paroxysmal atrial fibrillation/ flutter. She presented with acute on chronic hypoxic and hypercarbic respiratory failure secondary . Felt to be multifactorial due to pneumonia and CHF with underlying minimal respiratory reserve due to OSA/ OHS. She initially presented in normal sinus rhythm but went into rapid atrial flutter today with rates in the 130s. Last Echo in 05/2023 showed normal LV function. She was started on IV Cardizem . - Remains in atrial flutter with rates in the 140s. BP soft with systolic BP in the high 90s.  - Currently on IV Cardizem  15mg / hr.  - Currently on home PO Amiodarone  200mg  daily. Will stop this and switch to IV Amiodarone  with bolus. Can wean IV Cardizem  as able after starting this. - CHA2D2-VASc = 5 (coronary/ aortic calcifications noted on CT, CHF, HTN, DM, female). Currently on Eliquis  5mg  twice daily. Will stop this and start IV Heparin  given patient is currently NPO due to need for BiPAP. - Of note, she missed her dose of Eliquis  this morning due to need for BiPAP. Hopefully, she will convert on IV Amiodarone  because I worry that she would not tolerate a TEE well with her  OSA/ OHS with chronic respiratory failure. Per chart review, it looks like she converted with IV Amiodarone  and multiple dose of Digoxin  when previously in rapid atrial flutter in 06/2023. So we could try Digoxin  if she  does not converted with Amiodarone .  Acute on Chronic Diastolic CHF BNP 588. Initial chest x-ray was felt to favor infection over edema. Repeat chest x-ray on 2/4 showed cardiomegaly with mild central vascular congestion. Last Echo in 05/2023 showed LVEF of 60-65% with normal wall motion and diastolic parameters, normal RV size and function, and moderate aortic stenosis. She has been diuresed with IV Lasix  today. However, only net negative  79 mL this admission. Weighs 335 today, up 9 lbs from last documented weight during last admission.  - Very difficult to assess volume status due to body habitus. However, it looks like she is volume overloaded. - Can repeat Echo when better rate controlled. - IV Lasix  was increase to 80mg  daily today she has had more urinary output so far. May need to further increase to twice daily. - Continue to monitor daily weights, strict I/Os, and renal function.  Moderate Aortic Stenosis Dating back to at least 2019. Mean gradient this admission. - Continue to monitor as an outpatient.  Otherwise, per primary team: - Acute on chronic hypoxic and hypercarbic respiratory failure: on BiPAP - Pneumonia - COPD - OSA/ OHS - Type 2 diabetes mellitus - Hyperlipidemia - Hypothyroidism - GERD - Chronic pain   Risk Assessment/Risk Scores:    [place]  Heart Association (NYHA) Functional Class Functional status difficult to assess as she is wheelchair bound and on 5L of O2 at baseline  CHA2DS2-VASc Score = 5  This indicates a 7.2% annual risk of stroke. The patient's score is based upon: CHF History: 1 HTN History: 1 Diabetes History: 1 Stroke History: 0 Vascular Disease History: 1 Age Score: 0 Gender Score: 1     For questions or updates, please contact [facility] Please consult [email or web address] for contact info under    Signed, Bradyn Vassey E Raheem Kolbe, PA-C  11/06/2023 3:44 PM

## 2023-11-06 NOTE — Progress Notes (Signed)
 Pt currently off bipap at this time. HFNC titrated from 4L to 10L for low SpO2. Pt SpO2 increased from 87% to 93%. No complications and no further needs at this time.

## 2023-11-06 NOTE — Progress Notes (Addendum)
 Spoke with patient about bedside PICC placement including reason for placement, risks, benefits, and alternatives. Patient requested to speak with her husband first. PICC RN to reevaluate once this has taken place. Primary RN notified.

## 2023-11-06 NOTE — Assessment & Plan Note (Addendum)
 Patient tachycardic to 130s per nursing this morning after coming off BiPAP, EKG notable for atrial flutter and suspicious ST depressions  - Cardiology consult placed and will evaluate  - Started on Diltiazem  drip to help with rate control - Continue amiodarone  200 mg daily  - Continue Eliquis  5 mg BID daily

## 2023-11-06 NOTE — Plan of Care (Signed)

## 2023-11-06 NOTE — Progress Notes (Addendum)
 PHARMACY - ANTIBIOTIC CONSULT NOTE  Pharmacy Consult: Vancomycin /Cefepime  Indication:  PNA  Allergies  Allergen Reactions   Iodinated Contrast Media Anaphylaxis   Penicillins Anaphylaxis    Patient tolerates cefepime  (08/2017)   Insulin  Lispro Other (See Comments)    Adder per [facility] from SNF   Minoxidil Hives    Patient Measurements: Height: 5' 5 (165.1 cm) Weight: (!) 152.4 kg (335 lb 15.7 oz) IBW/kg (Calculated) : 57  Vital Signs: Temp: 100.4 F (38 C) (02/05 1433) Temp Source: Axillary (02/05 1433) BP: 103/72 (02/05 1433) Pulse Rate: 137 (02/05 1005)  Labs: Recent Labs    11/04/23 0526 11/04/23 2046 11/05/23 0614 11/06/23 0415 11/06/23 0545  HGB 11.9*  --  11.0* 9.7*  --   HCT 39.8  --  37.6 31.8*  --   PLT 210  --  258 250  --   CREATININE 0.93 1.02* 0.94  --  0.72  TROPONINIHS  --  11  --   --   --     Estimated Creatinine Clearance: 113.8 mL/min (by C-G formula based on SCr of 0.72 mg/dL).   Medical History: Past Medical History:  Diagnosis Date   Acquired hypothyroidism 11/13/2010   Qualifier: Diagnosis of   By: Pietro, MD, CODY Redell Dimes      Acute congestive heart failure (HCC) 03/18/2021   Acute idiopathic gout of left hand 02/16/2019   Acute kidney injury (HCC) 05/29/2023   Acute metabolic encephalopathy 09/29/2023   Acute on chronic diastolic CHF (congestive heart failure), NYHA class 3 (HCC) 09/25/2017   Acute on chronic respiratory failure with hypoxia and hypercapnia (HCC) 04/07/2022   Arthritis    qwhere (12/05/2017)   Asthma    Atrial flutter (HCC) 03/09/2021   Bleeding of the respiratory tract 04/07/2022   Bronchitis 05/30/2011   Cervical cancer (HCC) 2006   CHF exacerbation (HCC) 07/30/2022   Chronic heart failure with preserved ejection fraction (HCC) 02/23/2018   Chronic lower back pain    Chronic respiratory failure with hypoxia (HCC) 07/02/2018   ABG 11/2017 7.36/69   Consistent with obesity hypoventilation syndrome    Complication of anesthesia    I have a hard time waking up from under it (12/05/2017)   COPD (chronic obstructive pulmonary disease) (HCC) 03/04/2015   Coronary artery disease    Cough productive of clear sputum 06/03/2023   Diarrhea 06/18/2019   DJD (degenerative joint disease) 07/03/2021   DM2 (diabetes mellitus, type 2) (HCC) 11/10/2010   Dyspnea 12/05/2017   Dyspnea on exertion 02/09/2021   Edema 03/22/2011   Essential (primary) hypertension 11/10/2010   Formatting of this note might be different from the original.  Overview:   Qualifier: Diagnosis of   By: Gwenn Grimes      Last Assessment & Plan:   Watch blood pressure off of minoxidil and add medications as needed.   Essential hypertension 11/10/2010   Qualifier: Diagnosis of   By: Gwenn Grimes       Gastro-esophageal reflux disease without esophagitis 11/10/2010   GERD 11/10/2010   Qualifier: Diagnosis of   By: Gwenn Grimes       Hair loss 09/17/2019   Heart failure (HCC) 04/20/2021   Heart murmur    Hepatitis C    History of cervical cancer 03/22/2011   History of gout X 1   History of tobacco use 04/20/2021   Hx of atrial flutter 03/15/2021   Hyperglycemia due to type 2 diabetes mellitus (HCC) 04/20/2021   Hyperlipidemia  Hypertension associated with diabetes (HCC) 08/25/2018   Irritable bowel syndrome with diarrhea 06/18/2019   Limitation of activity due to disability 09/18/2021   Lymphedema of both lower extremities 03/22/2011   Metabolic alkalosis 03/28/2021   Microalbuminuria 04/20/2021   Migraine    monthly (12/05/2017)   Mild tricuspid regurgitation 04/02/2021   Mitral valve sclerosis 04/02/2021   Mixed hyperlipidemia 04/18/2020   Moderate aortic stenosis 04/20/2021   Mononeuropathy of lower extremity 04/09/2011   Formatting of this note might be different from the original.  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  Severe, corrected by CPAP 18 with C-flex of 3  2/13 bipap 20/14 sm ff mask h/h biflex+3cm  Formatting of this note might be different from the original.  Sleep study 06/2020: IMPRESSIONS  - Moderate obstructive sleep apnea occurred during this study (AHI = 17.7/h), mostly REM related  - No significant central sleep apnea occurred during   On home oxygen  therapy    2L all the time (12/05/2017)   On supplemental oxygen  by nasal cannula 04/20/2021   Onychomycosis 06/18/2019   OSA treated with BiPAP    have CPAP at home too; wearing BiPAP right now (12/05/2017)   Osteoarthritis 11/28/2010   Qualifier: Diagnosis of   By: Lelon RIGGERS, Scott       Other long term (current) drug therapy 02/12/2022   Paroxysmal atrial fibrillation (HCC) 09/04/2021   Physical debility 05/10/2018   Formatting of this note might be different from the original.  Due to morbid obesity   Physical deconditioning 04/24/2021   Pneumonia    several times (12/05/2017)   Pneumonia due to gram-positive bacteria 08/24/2017   Pressure injury of both heels, unstageable (HCC) 07/03/2021   Pulmonary edema, acute (HCC) 02/21/2022   Renal insufficiency 04/20/2021   Restless leg syndrome 03/22/2021   Restrictive lung disease secondary to obesity 01/28/2022   S/P hysterectomy 02/19/2021   Tinea pedis of both feet 11/12/2018   Unspecified atrial flutter (HCC) 02/12/2022   Upper GI bleed  07/18/2022   Venous insufficiency of both lower extremities 07/02/2021   Weakness 03/20/2021   Weight loss 03/15/2021     Assessment: Pharmacy consulted to dose vancomycin  and cefepime  for PNA.  Patient completed a course of Rocephin  and azithromycin .  Tmax 100.4, WBC WNL.  Renal function stable.  CRO 2/2>> 2/7  Azith 2/2>> 2/5 Vanc 2/5 >> Cefepime  2/5 >>   2/3 MRSA PCR - negative 2/5 RVP - 2/5 BCx -  Strep PNA ag negative   Goal of Therapy:  Vanc AUC 400-550   Plan:  Vanc 2500mg  IV x 1, then 2gm IV Q24H for AUC 432 using SCr 0.8 Cefepime  2gm IV Q8H Monitor renal fxn, clinical progress, vanc levels at Css  Iyesha Such D. Lendell, PharmD, BCPS, BCCCP 11/06/2023, 4:52 PM

## 2023-11-06 NOTE — Progress Notes (Signed)
   11/06/23 0023  BiPAP/CPAP/SIPAP  $ Non-Invasive Ventilator  Non-Invasive Vent Subsequent  BiPAP/CPAP/SIPAP Pt Type Adult  BiPAP/CPAP/SIPAP SERVO  Mask Type Full face mask  Mask Size Medium  Set Rate 15 breaths/min  Respiratory Rate 27 breaths/min  Pressure Support 10 cmH20  PEEP 8 cmH20  FiO2 (%) 60 %  Minute Ventilation 8.6  Leak 30  Peak Inspiratory Pressure (PIP) 21  Tidal Volume (Vt) 437  Patient Home Equipment No  Auto Titrate No  Press High Alarm 30 cmH2O  Press Low Alarm 5 cmH2O  CPAP/SIPAP surface wiped down Yes

## 2023-11-06 NOTE — Assessment & Plan Note (Addendum)
 Patient more alert on exam and was placed on high flow O2 during evaluation. Patient compliant with BiPAP throughout the night. Multiple VBG CO2 in 70-80s. Patient was placed back on BiPAP this morning due to oxygen  requirement and asymptomatic atrial flutter this morning.  Etiology likely multifactorial complicated by patients minimal respiratory reserve due to OSA/OHS. Considering pneumonia with risk factors for drug resistance, aspiration pneumonitis vs CHF exacerbation. Patient will continue on ceftriaxone  for coverage  - Continuous pulse ox - continue BiPAP while sleeping/naps, wean as tolerated to O2goal of 88-92% - NPO while on BiPAP  - Completed azithromycin  (2/2-2/4) and will continue ceftriaxone  (2/2-6), consider broadening for MRSA and pseudomonas coverage if becomes febrile  - Continue holding oxycodone  and do not administer benzos, given respiratory status and altered mentation/somnolence throughout admission  - AM CBC, BMP  - Legionella pending  - Pulm consult

## 2023-11-06 NOTE — Plan of Care (Signed)
 FMTS Interim Progress Note  Received report from RN [facility] that patient was still in A flutter with RVR with rates in the 140s. Patient is currently on max dose of dilt gtt at 15 ml/hr and on max dose of amiodarone . Called cardiology fellow on call given rate still uncontrolled with maximum dose of both medications. Spoke with Dr. Floretta who recommended additional 150 mg bolus amiodarone  with the expectation that she may not convert. Dr. Floretta also stated as long as patient remains hemodynamically stable, okay to allow for higher rates pending day team decision for further management.     A/P: A flutter with RVR Patient on max dose of both dilt gtt and amiodarone  IV, will bolus additional 150 mg amiodarone  per cardiology recs. If patient does not convert, will assess stability. If patient is hemodynamically stable and still in A flutter with RVR, will monitor closely without additional changes. Will reach out to cardiology if patients status changes.   Lonnie Earnest, MD 11/06/2023, 10:40 PM PGY-1, [facility] Family Medicine Service pager [phone]

## 2023-11-06 NOTE — Plan of Care (Signed)
 FMTS Brief Progress Note  S: Went to assess patient for somnolence, mental status changes in setting of pCO2 76 ? 80 at 0445.  On interview, patient was wearing BiPAP.  Woke to voice, participatory in interview without issue.  Oriented to self, place, situation.  Asked if she could take of her BiPAP to eat breakfast.  No new complaints, or subjective change in mental status.  Patient says that watching TV is boring.  No chest pain or shortness of breath.  Reinforced importance of BiPAP.  O: BP 113/63 (BP Location: Right Wrist)   Pulse 77   Temp 98.5 F (36.9 C) (Axillary)   Resp (!) 24   Ht 5' 5 (1.651 m)   Wt (!) 152.4 kg   SpO2 92%   BMI 55.91 kg/m    Constitutional: NAD, attentive to interview. Respiratory: On BiPAP, no increased work of breathing Neuro: No gross deficits.  Oriented to self, place and situation. Psych: Appropriate mood and affect.  A/P: 58 yo F admitted for acute respiratory failure with hypoxia and hypercarbia in the setting of continued increased pCO2's overnight (76 ? 80) on repeat VBG. Patient continues to be alert and oriented, wearing BiPAP.  Does not appear somnolent or altered at this time. Patient asked if she can remove BiPAP to eat breakfast. - RT to evaluate patient shortly to assess for BiPAP adjustments. - Rest of plan per day team. - Orders reviewed. Labs for AM ordered, which were adjusted as needed.  - If condition changes, plan includes reevaluation and adjustment to plan as needed.Chelsea Rollene Katz, MD 11/06/2023, 5:39 AM PGY-1, [facility] Night Resident  Please page [phone] with questions.

## 2023-11-06 NOTE — Plan of Care (Signed)
 FMTS Brief Progress Note  S: In to see patient at bedside with Dr. Elicia and Dr. Rollene. Patient wearing BiPAP. Able to follow commands and respond to questions appropriately.    O:  BP 103/72 (BP Location: Right Arm)   Pulse (!) 140   Temp (!) 100.4 F (38 C) (Axillary)   Resp (!) 21   Ht 5' 5 (1.651 m)   Wt (!) 152.4 kg   SpO2 95%   BMI 55.91 kg/m   General: NAD, wearing BiPAP  HEENT: Atraumatic, normocephalic  CV: tachycardic, no m/r/g Respiratory: normal WOB on BiPAP.  Neuro: Oriented x3, sleeping but responds to commands  Psych: Appropriate mood and affect   A/P: A flutter w RVR Acute hypoxic/hypercarbic respiratory failure Patient currently on dilt drip and amiodarone  infusion. Patient asymptomatic at this time. Will continue to trend heart rate. Patient alert and oriented, wearing BiPAP.  - Orders reviewed. Labs for AM ordered, which was adjusted as needed.  - RVP, BC pending - If condition changes, plan includes reassessment and adjustments as needed     Lonnie Earnest, MD 11/06/2023, 7:57 PM PGY-1, [facility] Night Resident  Please page [phone] with questions.

## 2023-11-06 NOTE — Progress Notes (Signed)
 Informed by lab that patients pCO2, Ven is 80 at 0445. This is up from 76 at 2/325 2342. Informed Dr. Zheng and he advised me to call the respiratory therapist to see if she can adjust the BiPAP. A doctor will also come up to look at patient.

## 2023-11-06 NOTE — Assessment & Plan Note (Addendum)
 Patient with prior history and suspect exacerbation. Volume status is difficult to assess given body habitus. S/P IV Lasix  40 mg x1 and with 1000 cc urinary output last 24 hours. Patient home medication is Torsemide  60 mg BID. Will increase lasix  this morning and redose diuretic tomorrow.  - Will give Lasix  80 mg IV this morning  - Continue Strict I/Os - Daily weights - Hold  jardiance  25 mg   Chronic and Stable Problems:   COPD: continue home inhalers, Singulair  DM II: NPO sips w/ meds, CBG at bedtime, Semglee  5 units BID and mSSI, given NPO status  Anemia: continue iron  supplementation, CTM patient Hgb dropped 9.7 today HLD: continue home atorvastatin  Hypothyroidism: continue home levothyroxine  150 mcg daily HLD: continue atorvastatin  20 mg at bedtime RLS: Hold home ropinirole  0.5 mg in the setting of somnolence on admission and want to limit central acting agents  GERD: continue pantoprazole  40 mg daily Chronic pain holding home meds, consider restarting if mentation continues to improve  FEN/GI: NPO while wearing BiPAP   PPx: Home Eliquis    Dispo: Med surg

## 2023-11-06 NOTE — Plan of Care (Signed)
 FMTS Interim Progress Note  Assessed patient at bedside given new onset fever.  Patient reports she is burning up.  Denies new respiratory or urinary symptoms.  Discussed pulmonology recommendation for tracheostomy, patient reports she will think about it.  Indicated she was seriously considering the option and would like to discuss further in the a.m.  A/P: New onset fever Persistent tachycardia in the setting of a flutter with RVR but otherwise vital signs stable and patient mentating appropriately.  Patient was receiving CAP coverage with ceftriaxone  azithromycin , will now broaden for pseudomonal and MRSA coverage given high risk. -RVP -Blood cultures -Chest x-ray -Switch from ceftriaxone  to cefepime  -Add vancomycin   Acute hypoxic respiratory failure secondary to severe OHS/OSA  On BiPAP, mentating appropriately with adequate oxygenation.  Discussed pulmonary recommendation for tracheostomy and patient considering.  Will follow-up in the AM to discuss further and reconsult pulm if agreeable to further discussion.  Theophilus Pagan, MD 11/06/2023, 5:07 PM PGY-2, [facility] Family Medicine Service pager [phone]

## 2023-11-06 NOTE — Progress Notes (Signed)
 Daily Progress Note Intern Pager: [phone]  Patient name: Chelsea [name] Medical record number: [id number] Date of birth: [birth year] Age: 58 y.o. Gender: female  Primary Care Provider: Pcp, No Consultants: Cardiology, Pulmonology  Code Status: Full Code   Pt Overview and Major Events to Date:  2/2 Admitted, started azithro and vanc, placed on BiPAP 2/3 Wean O2 as tolerable,  2/4 Patient non-compliant with BiPAP and required HF and BiPAP placement with RT 2/5 Patient in atrial flutter, started on Dilt drip and cardiology consulted   Assessment and Plan:  Chelsea [name] is a 58 y.o. female presenting with acute hypoxic respiratory failure. Differential for this patient's presentation of this includes pneumonia with risk factors for multidrug-resistant organism vs aspiration pneumonitis, CHF exacerbation, COPD excerebration, BiPAP noncompliance, less likely ACS. Patient was recently admitted for acute hypoxic respiratory failure and treated for CHF exacerbation and CAP.  Assessment & Plan Acute respiratory failure with hypoxia and hypercarbia (HCC) Patient more alert on exam and was placed on high flow O2 during evaluation. Patient compliant with BiPAP throughout the night. Multiple VBG CO2 in 70-80s. Patient was placed back on BiPAP this morning due to oxygen  requirement and asymptomatic atrial flutter this morning.  Etiology likely multifactorial complicated by patients minimal respiratory reserve due to OSA/OHS. Considering pneumonia with risk factors for drug resistance, aspiration pneumonitis vs CHF exacerbation. Patient will continue on ceftriaxone  for coverage  - Continuous pulse ox - continue BiPAP while sleeping/naps, wean as tolerated to O2goal of 88-92% - NPO while on BiPAP  - Completed azithromycin  (2/2-2/4) and will continue ceftriaxone  (2/2-6), consider broadening for MRSA and pseudomonas coverage if becomes febrile  - Continue holding oxycodone  and do not  administer benzos, given respiratory status and altered mentation/somnolence throughout admission  - AM CBC, BMP  - Legionella pending  - Pulm consult  Atrial flutter (HCC) Patient tachycardic to 130s per nursing this morning after coming off BiPAP, EKG notable for atrial flutter and suspicious ST depressions  - Cardiology consult placed and will evaluate  - Started on Diltiazem  drip to help with rate control - Continue amiodarone  200 mg daily  - Continue Eliquis  5 mg BID daily  (HFpEF) heart failure with preserved ejection fraction [facility]) Patient with prior history and suspect exacerbation. Volume status is difficult to assess given body habitus. S/P IV Lasix  40 mg x1 and with 1000 cc urinary output last 24 hours. Patient home medication is Torsemide  60 mg BID. Will increase lasix  this morning and redose diuretic tomorrow.  - Will give Lasix  80 mg IV this morning  - Continue Strict I/Os - Daily weights - Hold  jardiance  25 mg   Chronic and Stable Problems:   COPD: continue home inhalers, Singulair  DM II: NPO sips w/ meds, CBG at bedtime, Semglee  5 units BID and mSSI, given NPO status  Anemia: continue iron  supplementation, CTM patient Hgb dropped 9.7 today HLD: continue home atorvastatin  Hypothyroidism: continue home levothyroxine  150 mcg daily HLD: continue atorvastatin  20 mg at bedtime RLS: Hold home ropinirole  0.5 mg in the setting of somnolence on admission and want to limit central acting agents  GERD: continue pantoprazole  40 mg daily Chronic pain holding home meds, consider restarting if mentation continues to improve  FEN/GI: NPO while wearing BiPAP   PPx: Home Eliquis    Dispo: Med surg   Subjective:  Patient frustrated overnight with inability to eat. Voiced understanding of requirements to eat and get off BiPAP. Must alert nursing staff if going to  nap in the day.   Objective: Temp:  [98.5 F (36.9 C)] 98.5 F (36.9 C) (02/05 0010) Pulse Rate:  [71-77] 76 (02/05  0023) Resp:  [20-27] 27 (02/05 0301) BP: (113-115)/(63-64) 113/63 (02/05 0010) SpO2:  [92 %-98 %] 97 % (02/05 0301) FiO2 (%):  [60 %] 60 % (02/05 0301) Physical Exam: General: Alert and Oriented with High Flow in place Obese  HEENT: atraumatic, MMM, no HFNC, MMM Cardiovascular: RRR, systolic ejection murmur heard best at LUSB Respiratory: diminished lung sounds with auscultation limited to body habitus  Gastrointestinal: obese, not TTP MSK: moves all extremities  Derm: chronic venous skin changes with erythema on bilateral lower extremities   Laboratory: Most recent CBC Lab Results  Component Value Date   WBC 6.7 11/06/2023   HGB 9.7 (L) 11/06/2023   HCT 31.8 (L) 11/06/2023   MCV 90.6 11/06/2023   PLT 250 11/06/2023   Most recent BMP    Latest Ref Rng & Units 11/06/2023    5:45 AM  BMP  Glucose 70 - 99 mg/dL 872   BUN 6 - 20 mg/dL 28   Creatinine 9.55 - 1.00 mg/dL 9.27   Sodium 864 - 854 mmol/L 138   Potassium 3.5 - 5.1 mmol/L 3.5   Chloride 98 - 111 mmol/L 91   CO2 22 - 32 mmol/L 37   Calcium  8.9 - 10.3 mg/dL 8.5     Other pertinent labs:   Component Ref Range & Units (hover) 04:45 (11/06/23) 1 d ago (11/05/23) 1 d ago (11/05/23) 3 d ago (11/03/23) 3 d ago (11/03/23) 1 mo ago (10/03/23) 1 mo ago (09/29/23)  pH, Ven 7.33 7.35 7.37 7.35 7.394 7.37 7.32  pCO2, Ven 80 High Panic  76 High Panic  CM 76 High Panic  CM 73 High Panic  CM 65.1 High  65 High  74 High Panic  CM  Comment: CRITICAL RESULT CALLED TO, READ BACK BY AND VERIFIED WITH: M. NEVIL, RN AT 0517 02.05.25 JLASIGAN  pO2, Ven 45 65 High  68 High  116 High  46 High  52 High  45  Bicarbonate 42.2 High  42.0 High  43.9 High  40.3 High  39.8 High  37.6 High  37.4 High   Acid-Base Excess 12.5 High  12.8 High  14.8 High  11.4 High  12.0 High  9.7 High  9.0 High   O2 Saturation 74.8 92.4 91.8 98.5 79 78.3 77.7  Patient temperature 37.0 37.0 CM 37.0 37.0  37.0 37.8 CM   Imaging/Diagnostic Tests: EKG: Atrial Flutter, ST  and T wave abnormality  My interpretation: Atrial Flutter and new ST depressions   Lenard Calin, MD 11/06/2023, 9:47 AM  PGY-1, [facility] Family Medicine FPTS Intern pager: [phone], text pages welcome Secure chat group [facility] Teaching Service

## 2023-11-06 NOTE — Progress Notes (Signed)
 Patient spoke with spouse, but wants to wait until tomorrow AM for placement. Primary RN notified.

## 2023-11-07 DIAGNOSIS — J9602 Acute respiratory failure with hypercapnia: Secondary | ICD-10-CM | POA: Diagnosis not present

## 2023-11-07 DIAGNOSIS — J189 Pneumonia, unspecified organism: Secondary | ICD-10-CM

## 2023-11-07 DIAGNOSIS — I509 Heart failure, unspecified: Secondary | ICD-10-CM | POA: Diagnosis not present

## 2023-11-07 DIAGNOSIS — J9601 Acute respiratory failure with hypoxia: Secondary | ICD-10-CM | POA: Diagnosis not present

## 2023-11-07 DIAGNOSIS — J101 Influenza due to other identified influenza virus with other respiratory manifestations: Secondary | ICD-10-CM | POA: Insufficient documentation

## 2023-11-07 HISTORY — DX: Pneumonia, unspecified organism: J18.9

## 2023-11-07 LAB — BASIC METABOLIC PANEL
Anion gap: 13 (ref 5–15)
BUN: 16 mg/dL (ref 6–20)
CO2: 34 mmol/L — ABNORMAL HIGH (ref 22–32)
Calcium: 8 mg/dL — ABNORMAL LOW (ref 8.9–10.3)
Chloride: 90 mmol/L — ABNORMAL LOW (ref 98–111)
Creatinine, Ser: 0.7 mg/dL (ref 0.44–1.00)
GFR, Estimated: >60 mL/min (ref >60)
Glucose, Bld: 141 mg/dL — ABNORMAL HIGH (ref 70–99)
Potassium: 3.3 mmol/L — ABNORMAL LOW (ref 3.5–5.1)
Sodium: 137 mmol/L (ref 135–145)

## 2023-11-07 LAB — LEGIONELLA PNEUMOPHILA SEROGP 1 UR AG: L. pneumophila Serogp 1 Ur Ag: NEGATIVE

## 2023-11-07 LAB — CBC
HCT: 37.9 % (ref 36.0–46.0)
Hemoglobin: 11.6 g/dL — ABNORMAL LOW (ref 12.0–15.0)
MCH: 26.9 pg (ref 26.0–34.0)
MCHC: 30.6 g/dL (ref 30.0–36.0)
MCV: 87.7 fL (ref 80.0–100.0)
Platelets: 224 10*3/uL (ref 150–400)
RBC: 4.32 MIL/uL (ref 3.87–5.11)
RDW: 15.6 % — ABNORMAL HIGH (ref 11.5–15.5)
WBC: 6.2 10*3/uL (ref 4.0–10.5)
nRBC: 0 % (ref 0.0–0.2)

## 2023-11-07 LAB — APTT
aPTT: 34 s (ref 24–36)
aPTT: 57 s — ABNORMAL HIGH (ref 24–36)

## 2023-11-07 LAB — GLUCOSE, CAPILLARY
Glucose-Capillary: 139 mg/dL — ABNORMAL HIGH (ref 70–99)
Glucose-Capillary: 130 mg/dL — ABNORMAL HIGH (ref 70–99)
Glucose-Capillary: 109 mg/dL — ABNORMAL HIGH (ref 70–99)
Glucose-Capillary: 251 mg/dL — ABNORMAL HIGH (ref 70–99)

## 2023-11-07 LAB — MAGNESIUM: Magnesium: 2 mg/dL (ref 1.7–2.4)

## 2023-11-07 MED ORDER — SODIUM CHLORIDE 0.9% FLUSH: 10.0000 mL | INTRAVENOUS | Status: DC | PRN

## 2023-11-07 MED ORDER — OSELTAMIVIR PHOSPHATE 75 MG PO CAPS
75.0000 mg | ORAL_CAPSULE | Freq: Two times a day (BID) | ORAL | Status: DC
  Administered 2023-11-07 – 2023-11-11 (×9): 75 mg via ORAL
  Filled 2023-11-07 (×10): qty 1

## 2023-11-07 MED ORDER — POTASSIUM CHLORIDE 10 MEQ/100ML IV SOLN
10.0000 meq | Freq: Once | INTRAVENOUS | Status: AC
  Administered 2023-11-07: 10 meq via INTRAVENOUS

## 2023-11-07 MED ORDER — SODIUM CHLORIDE 0.9% FLUSH
10.0000 mL | Freq: Two times a day (BID) | INTRAVENOUS | Status: DC
  Administered 2023-11-07: 30 mL
  Administered 2023-11-07 – 2023-11-12 (×11): 10 mL
  Administered 2023-11-13: 20 mL
  Administered 2023-11-13 – 2023-11-15 (×4): 10 mL

## 2023-11-07 MED ORDER — CHLORHEXIDINE GLUCONATE CLOTH 2 % EX PADS
6.0000 | MEDICATED_PAD | Freq: Every day | CUTANEOUS | Status: DC
  Administered 2023-11-07 – 2023-11-14 (×8): 6 via TOPICAL

## 2023-11-07 MED ORDER — DILTIAZEM HCL 100 MG IV SOLR
5.0000 mg/h | INTRAVENOUS | Status: DC
  Filled 2023-11-07: qty 100

## 2023-11-07 MED ORDER — DIGOXIN 0.25 MG/ML IJ SOLN
0.2500 mg | Freq: Once | INTRAMUSCULAR | Status: AC
  Administered 2023-11-07: 0.25 mg via INTRAVENOUS
  Filled 2023-11-07 (×2): qty 1

## 2023-11-07 MED ORDER — FUROSEMIDE 10 MG/ML IJ SOLN: 80.0000 mg | Freq: Every day | INTRAMUSCULAR | Status: DC

## 2023-11-07 MED ORDER — KETOROLAC TROMETHAMINE 15 MG/ML IJ SOLN
15.0000 mg | Freq: Once | INTRAMUSCULAR | Status: AC
  Administered 2023-11-07: 15 mg via INTRAVENOUS
  Filled 2023-11-07: qty 1

## 2023-11-07 MED ORDER — FUROSEMIDE 10 MG/ML IJ SOLN
80.0000 mg | Freq: Two times a day (BID) | INTRAMUSCULAR | Status: DC
  Administered 2023-11-07: 80 mg via INTRAVENOUS
  Filled 2023-11-07: qty 8

## 2023-11-07 MED ORDER — DILTIAZEM HCL-DEXTROSE 125-5 MG/125ML-% IV SOLN (PREMIX)
5.0000 mg/h | INTRAVENOUS | Status: DC
  Administered 2023-11-07 – 2023-11-09 (×6): 15 mg/h via INTRAVENOUS
  Filled 2023-11-07 (×7): qty 125

## 2023-11-07 MED ORDER — POTASSIUM CHLORIDE CRYS ER 20 MEQ PO TBCR
40.0000 meq | EXTENDED_RELEASE_TABLET | ORAL | Status: AC
  Administered 2023-11-07 (×2): 40 meq via ORAL
  Filled 2023-11-07 (×2): qty 2

## 2023-11-07 MED ORDER — SODIUM CHLORIDE 0.9 % IV SOLN
1.0000 g | INTRAVENOUS | Status: DC
  Administered 2023-11-07: 1 g via INTRAVENOUS

## 2023-11-07 MED ORDER — FUROSEMIDE 10 MG/ML IJ SOLN
INTRAMUSCULAR | Status: AC
  Administered 2023-11-07: 80 mg via INTRAVENOUS
  Filled 2023-11-07: qty 4

## 2023-11-07 NOTE — Assessment & Plan Note (Addendum)
 Patient with prior history and suspect exacerbation. Volume status is difficult to assess given body habitus. S/P IV Lasix  80 mg x2 and with 3000 cc urinary output last 24 hours. Patient home medication is Torsemide  60 mg BID.  Will continue diuresis this morning given good UO. Ordered   - Will dose Lasix  80 mg IV BID  - Continue Strict I/Os - Daily weights - Monitor BMP and Mag daily with diuresis  - Replete lytes as needed  - Hold  jardiance  25 mg

## 2023-11-07 NOTE — Progress Notes (Signed)
 Peripherally Inserted Central Catheter Placement  The IV Nurse has discussed with the patient and/or persons authorized to consent for the patient, the purpose of this procedure and the potential benefits and risks involved with this procedure.  The benefits include less needle sticks, lab draws from the catheter, and the patient may be discharged home with the catheter. Risks include, but not limited to, infection, bleeding, blood clot (thrombus formation), and puncture of an artery; nerve damage and irregular heartbeat and possibility to perform a PICC exchange if needed/ordered by physician.  Alternatives to this procedure were also discussed.  Bard Power PICC patient education guide, fact sheet on infection prevention and patient information card has been provided to patient /or left at bedside.    PICC Placement Documentation  PICC Triple Lumen 11/07/23 Right Brachial 40 cm 0 cm (Active)  Indication for Insertion or Continuance of Line Vasoactive infusions 11/07/23 1012  Exposed Catheter (cm) 0 cm 11/07/23 1012  Site Assessment Clean, Dry, Intact 11/07/23 1012  Lumen #1 Status Flushed;Saline locked;Blood return noted 11/07/23 1012  Lumen #2 Status Flushed;Saline locked;Blood return noted 11/07/23 1012  Lumen #3 Status Flushed;Saline locked;Blood return noted 11/07/23 1012  Dressing Type Transparent;Securing device 11/07/23 1012  Dressing Status Antimicrobial disc/dressing in place 11/07/23 1012  Line Care Connections checked and tightened 11/07/23 1012  Line Adjustment (NICU/IV Team Only) No 11/07/23 1012  Dressing Intervention New dressing;Adhesive placed at insertion site (IV team only) 11/07/23 1012  Dressing Change Due 11/14/23 11/07/23 1012       Chelsea [name] 11/07/2023, 10:14 AM

## 2023-11-07 NOTE — Progress Notes (Addendum)
 Rounding Note    Patient Name: Chelsea [name] Date of Encounter: 11/07/2023   [facility] Cardiologist: Oneil Parchment, MD   Subjective   She is found resting off BiPAP. She reports leg pain and heart racing in addition to ongoing cough.  Inpatient Medications    Scheduled Meds:  atorvastatin   20 mg Oral QPM   buPROPion  ER  100 mg Oral BID   busPIRone   7.5 mg Oral BID   ferrous sulfate   325 mg Oral Daily   fluticasone  furoate-vilanterol  1 puff Inhalation Daily   And   umeclidinium bromide   1 puff Inhalation Daily   insulin  aspart  0-15 Units Subcutaneous TID WC   insulin  glargine-yfgn  5 Units Subcutaneous BID   levothyroxine   150 mcg Oral Q0600   loratadine   10 mg Oral Daily   montelukast   10 mg Oral QHS   pantoprazole   40 mg Oral Daily   potassium chloride   40 mEq Oral Q2H   Continuous Infusions:  amiodarone  30 mg/hr (11/07/23 0225)   ceFEPime  (MAXIPIME ) IV 2 g (11/07/23 0143)   diltiazem  (CARDIZEM ) infusion     heparin  1,700 Units/hr (11/07/23 0145)   vancomycin      PRN Meds: acetaminophen , albuterol , diclofenac  Sodium, guaiFENesin -dextromethorphan , sodium chloride  flush   Vital Signs    Vitals:   11/07/23 0443 11/07/23 0528 11/07/23 0658 11/07/23 0725  BP: (!) 92/54 100/73 (!) 106/91 99/75  Pulse:  (!) 139 (!) 138 (!) 137  Resp:  (!) 22 (!) 27 20  Temp: 98.6 F (37 C) 98.5 F (36.9 C)  98 F (36.7 C)  TempSrc: Axillary Axillary  Oral  SpO2:  98% 97% 93%  Weight: (!) 148.2 kg     Height:        Intake/Output Summary (Last 24 hours) at 11/07/2023 0828 Last data filed at 11/07/2023 0324 Gross per 24 hour  Intake 1237.29 ml  Output 3000 ml  Net -1762.71 ml      11/07/2023    4:43 AM 11/04/2023   11:39 PM 11/04/2023    8:32 PM  Last 3 Weights  Weight (lbs) 326 lb 11.6 oz 335 lb 15.7 oz 335 lb 15.7 oz  Weight (kg) 148.2 kg 152.4 kg 152.4 kg      Telemetry    Atrial flutter with CM862 - Personally Reviewed  ECG    No new tracings -  Personally Reviewed  Physical Exam   GEN: obese female in no acute distress.   Neck: No JVD - exam difficult Cardiac: irregular rhythm, tachycardic rate  Respiratory: rhonchi throughout. GI: Soft, nontender, non-distended  MS: B LE swelling with chronic skin changes, pulses weak, legs cool Neuro:  Nonfocal  Psych: Normal affect   Labs    High Sensitivity Troponin:   Recent Labs  Lab 11/04/23 2046  TROPONINIHS 11     Chemistry Recent Labs  Lab 11/05/23 0614 11/06/23 0545 11/06/23 1602 11/07/23 0451  NA 137 138 138 137  K 3.8 3.5 3.9 3.3*  CL 91* 91* 89* 90*  CO2 33* 37* 38* 34*  GLUCOSE 162* 127* 167* 141*  BUN 38* 28* 22* 16  CREATININE 0.94 0.72 0.78 0.70  CALCIUM  8.9 8.5* 8.3* 8.0*  MG 2.2 2.0 1.9 2.0  PROT 7.2  --   --   --   ALBUMIN  2.8*  --   --   --   AST 13*  --   --   --   ALT 12  --   --   --  ALKPHOS 83  --   --   --   BILITOT 0.4  --   --   --   GFRNONAA >60 >60 >60 >60  ANIONGAP 13 10 11 13     Lipids No results for input(s): CHOL, TRIG, HDL, LABVLDL, LDLCALC, CHOLHDL in the last 168 hours.  Hematology Recent Labs  Lab 11/06/23 0415 11/06/23 1602 11/07/23 0451  WBC 6.7 6.3 6.2  RBC 3.51* 4.15 4.32  HGB 9.7* 11.1* 11.6*  HCT 31.8* 37.2 37.9  MCV 90.6 89.6 87.7  MCH 27.6 26.7 26.9  MCHC 30.5 29.8* 30.6  RDW 16.2* 15.6* 15.6*  PLT 250 252 224   Thyroid No results for input(s): TSH, FREET4 in the last 168 hours.  BNP Recent Labs  Lab 11/03/23 1951  BNP 411.3*    DDimer No results for input(s): DDIMER in the last 168 hours.   Radiology    DG CHEST PORT 1 VIEW Result Date: 11/06/2023 CLINICAL DATA:  Shortness of breath EXAM: PORTABLE CHEST 1 VIEW COMPARISON:  11/05/2023 FINDINGS: Cardiomegaly as seen previously. Slight worsening of patchy density in both lower lobes that could be atelectasis or mild pneumonia. Pulmonary venous hypertension as seen previously. No visible pleural fluid accumulation. IMPRESSION:  Cardiomegaly and pulmonary venous hypertension. Slight worsening of patchy density in both lower lobes that could be atelectasis or mild pneumonia. Electronically Signed   By: Oneil Officer M.D.   On: 11/06/2023 21:49   US  EKG SITE RITE Result Date: 11/06/2023 If Site Rite image not attached, placement could not be confirmed due to current cardiac rhythm.  DG CHEST PORT 1 VIEW Result Date: 11/05/2023 CLINICAL DATA:  Hypoxia. EXAM: PORTABLE CHEST 1 VIEW COMPARISON:  Chest radiograph dated 11/03/2023. FINDINGS: Cardiomegaly with mild central vascular congestion. No focal consolidation, pleural effusion, or pneumothorax. Overall improved aeration of the lungs and decrease in bilateral streaky density seen on the prior radiograph. No acute osseous pathology. IMPRESSION: Cardiomegaly with mild central vascular congestion. Electronically Signed   By: Vanetta Chou M.D.   On: 11/05/2023 16:31    Cardiac Studies   Echo 05/2023: 1. Left ventricular ejection fraction, by estimation, is 60 to 65%. The  left ventricle has normal function. The left ventricle has no regional  wall motion abnormalities. Left ventricular diastolic parameters were  normal.   2. Right ventricular systolic function is normal. The right ventricular  size is normal.   3. Left atrial size was mild to moderately dilated.   4. The mitral valve is degenerative. No evidence of mitral valve  regurgitation.   5. The aortic valve is calcified. Aortic valve regurgitation is trivial.  Moderate aortic valve stenosis.   6. The inferior vena cava is dilated in size with >50% respiratory  variability, suggesting right atrial pressure of 8 mmHg.   Patient Profile     58 y.o. female with a history of chronic HFpEF, paroxysmal atrial fibrillation/ flutter on Eliquis , moderate aortic stenosis, COPD with chronic respiratory respiratory failure on home O2, obstructive sleep apnea/ obesity hypoventilation syndrome on BiPAP,  hypertension,  hyperlipidemia, type 2 diabetes mellitus, GERD, and morbid obesity with BMI >50  who is being seen  for the evaluation of atrial flutter   Assessment & Plan    Atrial flutter with RVR - On amiodarone  IV and cardizem  gtt running at  - Maintaining rates at 137  -- she has a history of converting with IV amiodarone  and digoxin  -- she has not converted as of this morning - can  consider IV digoxin  starting with 0.25 mg IV today -- if she doesn't convert with addition of digoxin ,  I suspect she will need TEE-DCCV for atrial flutter, but timing given her ongoing influenza will need to be considered -- she reports feeling her heart race   Chronic anticoagulation - she confirms she missed one dose of eliquis  - she was NPO with BiPAP need, so eliquis  was transitioned to heparin  gtt   Acute on chronic diastolic heart failure Lower extremity swelling - consider repeating echo when converted - consider another dose of 80 mg IV lasix  today - difficult to assess volume status on exam given BMI 54 - K has been supplemented   Influenza A PNA -- She continues to cough from pneumonia -- IV ABX running -- she is resting comfortably in bed, but reports continued coughing   Chronic respiratory failure on home O2 OSA on BiPAP COPD Obesity with OHS - per primary     For questions or updates, please contact [facility] Please consult [email or web address] for contact info under        Signed, Jon Nat Hails, PA  11/07/2023, 8:28 AM    Personally seen and examined. Agree with above.  58 year old female with following issues:  Ill appearing in bed, laying flat.   AFLUTTER -IV amio, cardizem . Taking amio 200 at home.  -Hold off on cardioversion given flu - with BMI >50, likely not going to hold.  -Eliquis  BIPAP  Frequent prolonged hospitalization.   FLU OSA MORBID OBESITY - continue to encourage weight loss.   Oneil Parchment, MD

## 2023-11-07 NOTE — Progress Notes (Signed)
 PHARMACY - ANTICOAGULATION CONSULT NOTE  Pharmacy Consult for Heparin  Indication: atrial flutter  Allergies  Allergen Reactions   Iodinated Contrast Media Anaphylaxis   Penicillins Anaphylaxis    Patient tolerates cefepime  (08/2017)   Insulin  Lispro Other (See Comments)    Adder per [facility] - Kmi from SNF   Minoxidil Hives    Patient Measurements: Height: 5' 5 (165.1 cm) Weight: (!) 152.4 kg (335 lb 15.7 oz) IBW/kg (Calculated) : 57 Heparin  Dosing Weight: 95 kg  Vital Signs: Temp: 98.9 F (37.2 C) (02/06 0026) Temp Source: Axillary (02/06 0026) BP: 108/62 (02/06 0026) Pulse Rate: 140 (02/06 0026)  Labs: Recent Labs    11/04/23 2046 11/05/23 9385 11/06/23 0415 11/06/23 0545 11/06/23 1602 11/06/23 2333  HGB  --  11.0* 9.7*  --  11.1*  --   HCT  --  37.6 31.8*  --  37.2  --   PLT  --  258 250  --  252  --   APTT  --   --   --   --   --  34  CREATININE 1.02* 0.94  --  0.72 0.78  --   TROPONINIHS 11  --   --   --   --   --     Estimated Creatinine Clearance: 113.8 mL/min (by C-G formula based on SCr of 0.78 mg/dL).   Medical History: Past Medical History:  Diagnosis Date   Acquired hypothyroidism 11/13/2010   Qualifier: Diagnosis of   By: Pietro, MD, CODY Redell Dimes      Acute congestive heart failure (HCC) 03/18/2021   Acute idiopathic gout of left hand 02/16/2019   Acute kidney injury (HCC) 05/29/2023   Acute metabolic encephalopathy 09/29/2023   Acute on chronic diastolic CHF (congestive heart failure), NYHA class 3 (HCC) 09/25/2017   Acute on chronic respiratory failure with hypoxia and hypercapnia (HCC) 04/07/2022   Arthritis    qwhere (12/05/2017)   Asthma    Atrial flutter (HCC) 03/09/2021   Bleeding of the respiratory tract 04/07/2022   Bronchitis 05/30/2011   Cervical cancer (HCC) 2006   CHF exacerbation (HCC) 07/30/2022   Chronic heart failure with preserved ejection fraction (HCC) 02/23/2018   Chronic lower back pain    Chronic respiratory  failure with hypoxia (HCC) 07/02/2018   ABG 11/2017 7.36/69   Consistent with obesity hypoventilation syndrome   Complication of anesthesia    I have a hard time waking up from under it (12/05/2017)   COPD (chronic obstructive pulmonary disease) (HCC) 03/04/2015   Coronary artery disease    Cough productive of clear sputum 06/03/2023   Diarrhea 06/18/2019   DJD (degenerative joint disease) 07/03/2021   DM2 (diabetes mellitus, type 2) (HCC) 11/10/2010   Dyspnea 12/05/2017   Dyspnea on exertion 02/09/2021   Edema 03/22/2011   Essential (primary) hypertension 11/10/2010   Formatting of this note might be different from the original.  Overview:   Qualifier: Diagnosis of   By: Gwenn Grimes      Last Assessment & Plan:   Watch blood pressure off of minoxidil and add medications as needed.   Essential hypertension 11/10/2010   Qualifier: Diagnosis of   By: Gwenn Grimes       Gastro-esophageal reflux disease without esophagitis 11/10/2010   GERD 11/10/2010   Qualifier: Diagnosis of   By: Gwenn Grimes       Hair loss 09/17/2019   Heart failure (HCC) 04/20/2021   Heart murmur    Hepatitis C    History of  cervical cancer 03/22/2011   History of gout X 1   History of tobacco use 04/20/2021   Hx of atrial flutter 03/15/2021   Hyperglycemia due to type 2 diabetes mellitus (HCC) 04/20/2021   Hyperlipidemia    Hypertension associated with diabetes (HCC) 08/25/2018   Irritable bowel syndrome with diarrhea 06/18/2019   Limitation of activity due to disability 09/18/2021   Lymphedema of both lower extremities 03/22/2011   Metabolic alkalosis 03/28/2021   Microalbuminuria 04/20/2021   Migraine    monthly (12/05/2017)   Mild tricuspid regurgitation 04/02/2021   Mitral valve sclerosis 04/02/2021   Mixed hyperlipidemia 04/18/2020   Moderate aortic stenosis 04/20/2021   Mononeuropathy of lower extremity 04/09/2011   Formatting of this note might be different from the original.  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  Severe, corrected by CPAP 18 with C-flex of 3  2/13 bipap 20/14 sm ff mask h/h biflex+3cm  Formatting of this note might be different from the original.  Sleep study 06/2020: IMPRESSIONS  - Moderate obstructive sleep apnea occurred during this study (AHI = 17.7/h), mostly REM related  - No significant central sleep apnea occurred during   On home oxygen  therapy    2L all the time (12/05/2017)   On supplemental oxygen  by nasal cannula 04/20/2021   Onychomycosis 06/18/2019   OSA treated with BiPAP    have CPAP at home too; wearing BiPAP right now (12/05/2017)   Osteoarthritis 11/28/2010   Qualifier: Diagnosis of   By: Lelon RIGGERS, Scott       Other long term (current) drug therapy 02/12/2022   Paroxysmal atrial fibrillation (HCC) 09/04/2021   Physical debility 05/10/2018   Formatting of this note might be different from the original.  Due to morbid obesity   Physical deconditioning 04/24/2021   Pneumonia    several times (12/05/2017)   Pneumonia due to gram-positive bacteria 08/24/2017   Pressure injury of both heels, unstageable (HCC) 07/03/2021   Pulmonary edema, acute (HCC) 02/21/2022   Renal insufficiency 04/20/2021   Restless leg syndrome 03/22/2021   Restrictive lung disease secondary to obesity 01/28/2022   S/P hysterectomy  02/19/2021   Tinea pedis of both feet 11/12/2018   Unspecified atrial flutter (HCC) 02/12/2022   Upper GI bleed 07/18/2022   Venous insufficiency of both lower extremities 07/02/2021   Weakness 03/20/2021   Weight loss 03/15/2021    Medications:  Scheduled:   atorvastatin   20 mg Oral QPM   buPROPion  ER  100 mg Oral BID   busPIRone   7.5 mg Oral BID   ferrous sulfate   325 mg Oral Daily   fluticasone  furoate-vilanterol  1 puff Inhalation Daily   And   umeclidinium bromide   1 puff Inhalation Daily   insulin  aspart  0-15 Units Subcutaneous TID WC   insulin  glargine-yfgn  5 Units Subcutaneous BID   levothyroxine   150 mcg Oral Q0600   loratadine   10 mg Oral Daily   montelukast   10 mg Oral QHS   pantoprazole   40 mg Oral Daily    Assessment: 58 y.o. female with Aflutter, Eliquis  on hold, for heparin   Goal of Therapy:  aPTT 66-102 sec Heparin  level 0.3-0.7 units/ml Monitor platelets by anticoagulation protocol: Yes   Plan:  Increase Heparin  1700 units/hr Check heparin  level in 8 hours.   Beautifull Cisar, Cordella Misty 11/07/2023,1:37 AM

## 2023-11-07 NOTE — Progress Notes (Signed)
 Daily Progress Note Intern Pager: [phone]  Patient name: Chelsea [name] Medical record number: [id number] Date of birth: Feb 29, 1966 Age: 58 y.o. Gender: female  Primary Care Provider: Pcp, No Consultants: Cardiology and Pulmonology  Code Status:   Pt Overview and Major Events to Date:  2/2 Admitted, started azithro and vanc, placed on BiPAP 2/3 Wean O2 as tolerable,  2/4 Patient non-compliant with BiPAP and required HF and BiPAP placement with RT 2/5 Patient in atrial flutter, started on Dilt drip and cardiology consulted 2/6 Maxed out on dilt and amio drips, added IV Dig per cards   Assessment and Plan:  Chelsea [name] is a 58 y.o. female presenting with acute hypoxic respiratory failure. Differential for this patient's presentation of this includes pneumonia with risk factors for multidrug-resistant organism vs aspiration pneumonitis, CHF exacerbation, COPD excerebration, BiPAP noncompliance, less likely ACS. Patient was recently admitted for acute hypoxic respiratory failure and treated for CHF exacerbation and CAP.   Assessment & Plan Acute respiratory failure with hypoxia and hypercarbia (HCC) Patient febrile yesterday evening and antibiotics were broadened to vanc (2/5) and cefepime  (2/5).  Patient afebrile this morning and RVP positive for H1N1. Abx were narrowed back to ceftriaxone  only this morning. Patient more alert on exam, high flow O2 during evaluation. Discussed option of consulting pulm for potential trach placement and patient is considering. Etiology likely multifactorial complicated by patients minimal respiratory reserve due to OSA/OHS. Considering pneumonia with risk factors for drug resistance, aspiration pneumonitis vs CHF exacerbation.  - Continuous pulse ox - Continue BiPAP while sleeping/naps, wean as tolerated to O2goal of 88-92% - NPO while on BiPAP  - Completed azithromycin  (2/2-2/4) and will continue ceftriaxone  (2/2-7) for CAP coverage  -  Continue holding oxycodone  and do not administer benzos, given respiratory status and altered mentation/somnolence throughout admission  - AM CBC, BMP  - Legionella negative  - Consider pulm consult if patient willing to discuss trach further Atrial flutter (HCC) Patient tachycardic to 130-140s past 24 hours. Cardiology on board and recommended dilt and amiodarone  drip. Patient required additional amiodarone  150 cc bolus to assist with rate control.  - Cards: Diltiazem  and amiodarone  drip to help with rate control, 150 cc amio bolus x1  - Cards: IV Dig 0.25 x1 today - Holding home amiodarone  200 mg daily   (HFpEF) heart failure with preserved ejection fraction [facility]) Patient with prior history and suspect exacerbation. Volume status is difficult to assess given body habitus. S/P IV Lasix  80 mg x2 and with 3000 cc urinary output last 24 hours. Patient home medication is Torsemide  60 mg BID.  Will continue diuresis this morning given good UO. Ordered   - Will dose Lasix  80 mg IV BID  - Continue Strict I/Os - Daily weights - Monitor BMP and Mag daily with diuresis  - Replete lytes as needed  - Hold  jardiance  25 mg  H1N1 influenza Patient febrile yesterday evening and antibiotics were broadened to vanc (2/5) and cefepime  (2/5).  Patient afebrile this morning and RVP positive for H1N1. CBC w/o leukocytosis.  - Tamiflu  (2/6 - ) will complete 5 day course  - AM CBC  - Bcx NG x12 hours   Chronic and Stable Problems:  COPD: continue home inhalers, Singulair  DM II: NPO sips w/ meds, CBG at bedtime, Holding Semglee  5 units BID and mSSI, given NPO status  Anemia: continue iron  supplementation, CTM patient Hgb stable at 11.6 today HLD: continue home atorvastatin  Hypothyroidism: continue home levothyroxine  150  mcg daily HLD: continue atorvastatin  20 mg at bedtime RLS: Hold home ropinirole  0.5 mg in the setting of somnolence on admission and want to limit central acting agents  GERD: continue  pantoprazole  40 mg daily Chronic pain holding home meds, consider restarting if mentation continues to improve  FEN/GI: NPO, sips w/ meds while wearing BiPAP   PPx: Heparin  infusion    Dispo: Med surg   Subjective:  Patient continues to have issues with oxygenation status. Hopes to eat. Still considering getting trach.   Objective: Temp:  [97.8 F (36.6 C)-100.4 F (38 C)] 98 F (36.7 C) (02/06 0725) Pulse Rate:  [79-142] 137 (02/06 0725) Resp:  [18-32] 20 (02/06 0725) BP: (92-119)/(54-91) 99/75 (02/06 0725) SpO2:  [83 %-99 %] 93 % (02/06 0725) FiO2 (%):  [60 %] 60 % (02/05 1954) Weight:  [148.2 kg] 148.2 kg (02/06 0443) Physical Exam: General: Alert and Oriented with High Flow in place Obese  HEENT: atraumatic, MMM, no HFNC, MMM Cardiovascular: RRR, systolic ejection murmur heard best at LUSB Respiratory: diminished lung sounds with auscultation limited to body habitus  Gastrointestinal: obese, not TTP MSK: moves all extremities  Derm: chronic venous skin changes with erythema on bilateral lower extremities     Laboratory: Most recent CBC Lab Results  Component Value Date   WBC 6.2 11/07/2023   HGB 11.6 (L) 11/07/2023   HCT 37.9 11/07/2023   MCV 87.7 11/07/2023   PLT 224 11/07/2023   Most recent BMP    Latest Ref Rng & Units 11/07/2023    4:51 AM  BMP  Glucose 70 - 99 mg/dL 858   BUN 6 - 20 mg/dL 16   Creatinine 9.55 - 1.00 mg/dL 9.29   Sodium 864 - 854 mmol/L 137   Potassium 3.5 - 5.1 mmol/L 3.3   Chloride 98 - 111 mmol/L 90   CO2 22 - 32 mmol/L 34   Calcium  8.9 - 10.3 mg/dL 8.0     Other pertinent labs   Respiratory Panel: Influenza A H1 2009   Bcx NG x12 hours    Imaging/Diagnostic Tests:  CXR 2/5 Radiologist Impression:  Cardiomegaly and pulmonary venous hypertension. Slight worsening of patchy density in both lower lobes that could be atelectasis or mild pneumonia. My interpretation: Cardiomegaly, similar to prior images, bilateral oppacities  could be infectious vs atelectasis   Lenard Calin, MD 11/07/2023, 8:26 AM  PGY-1, [facility] FPTS Intern pager: [phone], text pages welcome Secure chat group [facility] Teaching Service

## 2023-11-07 NOTE — Progress Notes (Signed)
   11/07/23 2238  BiPAP/CPAP/SIPAP  $ Non-Invasive Ventilator  Non-Invasive Vent Subsequent  BiPAP/CPAP/SIPAP Pt Type Adult  BiPAP/CPAP/SIPAP SERVO  Mask Type Full face mask  Mask Size Medium  Set Rate 15 breaths/min  Respiratory Rate 25 breaths/min  IPAP 22 cmH20  EPAP 8 cmH2O  FiO2 (%) 60 %  Minute Ventilation 11.1  Leak 37  Peak Inspiratory Pressure (PIP) 20  Tidal Volume (Vt) 367  Patient Home Equipment No  Auto Titrate No  Press High Alarm 30 cmH2O  Press Low Alarm 5 cmH2O  BiPAP/CPAP /SiPAP Vitals  Bilateral Breath Sounds Diminished

## 2023-11-07 NOTE — Assessment & Plan Note (Addendum)
 Patient tachycardic to 130-140s past 24 hours. Cardiology on board and recommended dilt and amiodarone  drip. Patient required additional amiodarone  150 cc bolus to assist with rate control.  - Cards: Diltiazem  and amiodarone  drip to help with rate control, 150 cc amio bolus x1  - Cards: IV Dig 0.25 x1 today - Holding home amiodarone  200 mg daily

## 2023-11-07 NOTE — TOC Progression Note (Signed)
 Transition of Care [facility]) - Progression Note    Patient Details  Name: YETZALI WELD MRN: [id number] Date of Birth: 1966/08/10  Transition of Care [facility]) CM/SW Contact  Isaiah Public, LCSWA Phone Number: 11/07/2023, 4:02 PM  Clinical Narrative:     Plan for patient to return to [facility] when medically ready. CSW will continue to follow.  Expected Discharge Plan: Skilled Nursing Facility Barriers to Discharge: Continued Medical Work up  Expected Discharge Plan and Services In-house Referral: Clinical Social Work     Living arrangements for the past 2 months: Skilled Nursing Facility                                       Social Determinants of Health (SDOH) Interventions SDOH Screenings   Food Insecurity: No Food Insecurity (11/04/2023)  Housing: Low Risk  (11/04/2023)  Transportation Needs: No Transportation Needs (11/04/2023)  Utilities: Not At Risk (11/04/2023)  Alcohol Screen: Low Risk  (07/05/2022)  Financial Resource Strain: Low Risk  (07/05/2022)  Physical Activity: Insufficiently Active (03/27/2021)   Received from [facility], [facility]  Social Connections: Unknown (02/04/2022)   Received from [facility], [facility]  Stress: No Stress Concern Present (09/15/2021)   Received from [facility], [facility]  Tobacco Use: Low Risk  (11/03/2023)    Readmission Risk Interventions    07/23/2022    2:19 PM 04/09/2022    4:17 PM  Readmission Risk Prevention Plan  Transportation Screening Complete Complete  PCP or Specialist Appt within 3-5 Days  Complete  HRI or Home Care Consult  Complete  Social Work Consult for Recovery Care Planning/Counseling  Complete  Palliative Care Screening  Not Applicable  Medication Review Oceanographer) Complete Referral to Pharmacy  PCP or Specialist appointment within 3-5 days of discharge Complete   HRI or Home Care Consult Complete   SW Recovery Care/Counseling Consult Complete   Palliative Care Screening Not  Applicable   Skilled Nursing Facility Complete

## 2023-11-07 NOTE — Assessment & Plan Note (Signed)
 Patient febrile yesterday evening and antibiotics were broadened to vanc (2/5) and cefepime  (2/5).  Patient afebrile this morning and RVP positive for H1N1. CBC w/o leukocytosis.  - Tamiflu  (2/6 - ) will complete 5 day course  - AM CBC  - Bcx NG x12 hours   Chronic and Stable Problems:  COPD: continue home inhalers, Singulair  DM II: NPO sips w/ meds, CBG at bedtime, Holding Semglee  5 units BID and mSSI, given NPO status  Anemia: continue iron  supplementation, CTM patient Hgb stable at 11.6 today HLD: continue home atorvastatin  Hypothyroidism: continue home levothyroxine  150 mcg daily HLD: continue atorvastatin  20 mg at bedtime RLS: Hold home ropinirole  0.5 mg in the setting of somnolence on admission and want to limit central acting agents  GERD: continue pantoprazole  40 mg daily Chronic pain holding home meds, consider restarting if mentation continues to improve  FEN/GI: NPO, sips w/ meds while wearing BiPAP   PPx: Heparin  infusion    Dispo: Med surg

## 2023-11-07 NOTE — Progress Notes (Signed)
 Pt placed on continuous bipap per MD.

## 2023-11-07 NOTE — Plan of Care (Signed)
  Problem: Metabolic: Goal: Ability to maintain appropriate glucose levels will improve Outcome: Progressing   Problem: Clinical Measurements: Goal: Cardiovascular complication will be avoided Outcome: Progressing   Problem: Safety: Goal: Ability to remain free from injury will improve Outcome: Progressing

## 2023-11-07 NOTE — Plan of Care (Signed)
 FMTS Brief Progress Note  S: On interview, patient sitting up in bed on HFNC 12 L satting low 90s, no acute distress. Husband in room. Alert and conversant. Removed BiPAP and ate dinner without incident. Agreed to resume BiPAP @2100 . No new symptoms or complaints at this time. Remains hopeful that she can eat breakfast. Denies CP.   O: BP 97/77 (BP Location: Left Wrist)   Pulse (!) 122   Temp 98.7 F (37.1 C) (Axillary)   Resp 18   Ht 5' 5 (1.651 m)   Wt (!) 148.2 kg   SpO2 93%   BMI 54.37 kg/m    Constitutional: NAD, attentive to interview. Cardiovascular: Tachy to 120s on monitor  Respiratory: diminished sounds in anterior lung fields MSK: ROM intact Neuro: No gross deficits. Psych: Appropriate mood and affect.  A/P: Acute respiratory failure with hypoxia and hypercarbia - Continuing SSI. - Start carb-modified diet. NPO while on BiPAP. - Orders reviewed. Labs for AM ordered, which were adjusted as needed.  - If condition changes, plan includes repeat assessment and consider revised plan.   Rollene Katz, MD 11/07/2023, 8:44 PM PGY-1, [facility] Night Resident  Please page [phone] with questions.

## 2023-11-07 NOTE — Progress Notes (Signed)
 PHARMACY - ANTICOAGULATION CONSULT NOTE  Pharmacy Consult for Heparin  Indication: atrial flutter  Allergies  Allergen Reactions   Iodinated Contrast Media Anaphylaxis   Penicillins Anaphylaxis    Patient tolerates cefepime  (08/2017)   Insulin  Lispro Other (See Comments)    Adder per [facility] from SNF   Minoxidil Hives    Patient Measurements: Height: 5' 5 (165.1 cm) Weight: (!) 148.2 kg (326 lb 11.6 oz) IBW/kg (Calculated) : 57 Heparin  Dosing Weight: 95 kg  Vital Signs: Temp: 98.7 F (37.1 C) (02/06 1549) Temp Source: Axillary (02/06 1549) BP: 97/77 (02/06 1549) Pulse Rate: 122 (02/06 1950)  Labs: Recent Labs    11/06/23 0415 11/06/23 0545 11/06/23 1602 11/06/23 2333 11/07/23 0451 11/07/23 1746  HGB 9.7*  --  11.1*  --  11.6*  --   HCT 31.8*  --  37.2  --  37.9  --   PLT 250  --  252  --  224  --   APTT  --   --   --  34  --  57*  CREATININE  --  0.72 0.78  --  0.70  --     Estimated Creatinine Clearance: 111.8 mL/min (by C-G formula based on SCr of 0.7 mg/dL).   Medical History: Past Medical History:  Diagnosis Date   Acquired hypothyroidism 11/13/2010   Qualifier: Diagnosis of   By: Pietro, MD, CODY Redell Dimes      Acute congestive heart failure (HCC) 03/18/2021   Acute idiopathic gout of left hand 02/16/2019   Acute kidney injury (HCC) 05/29/2023   Acute metabolic encephalopathy 09/29/2023   Acute on chronic diastolic CHF (congestive heart failure), NYHA class 3 (HCC) 09/25/2017   Acute on chronic respiratory failure with hypoxia and hypercapnia (HCC) 04/07/2022   Arthritis    qwhere (12/05/2017)   Asthma    Atrial flutter (HCC) 03/09/2021   Bleeding of the respiratory tract 04/07/2022   Bronchitis 05/30/2011   Cervical cancer (HCC) 2006   CHF exacerbation (HCC) 07/30/2022   Chronic heart failure with preserved ejection fraction (HCC) 02/23/2018   Chronic lower back pain    Chronic respiratory failure with hypoxia (HCC) 07/02/2018   ABG  11/2017 7.36/69   Consistent with obesity hypoventilation syndrome   Complication of anesthesia    I have a hard time waking up from under it (12/05/2017)   COPD (chronic obstructive pulmonary disease) (HCC) 03/04/2015   Coronary artery disease    Cough productive of clear sputum 06/03/2023   Diarrhea 06/18/2019   DJD (degenerative joint disease) 07/03/2021   DM2 (diabetes mellitus, type 2) (HCC) 11/10/2010   Dyspnea 12/05/2017   Dyspnea on exertion 02/09/2021   Edema 03/22/2011   Essential (primary) hypertension 11/10/2010   Formatting of this note might be different from the original.  Overview:   Qualifier: Diagnosis of   By: Gwenn Grimes      Last Assessment & Plan:   Watch blood pressure off of minoxidil and add medications as needed.   Essential hypertension 11/10/2010   Qualifier: Diagnosis of   By: Gwenn Grimes       Gastro-esophageal reflux disease without esophagitis 11/10/2010   GERD 11/10/2010   Qualifier: Diagnosis of   By: Gwenn Grimes       Hair loss 09/17/2019   Heart failure (HCC) 04/20/2021   Heart murmur    Hepatitis C    History of cervical cancer 03/22/2011   History of gout X 1   History of tobacco use 04/20/2021  Hx of atrial flutter 03/15/2021   Hyperglycemia due to type 2 diabetes mellitus (HCC) 04/20/2021   Hyperlipidemia    Hypertension associated with diabetes (HCC) 08/25/2018   Irritable bowel syndrome with diarrhea 06/18/2019   Limitation of activity due to disability 09/18/2021   Lymphedema of both lower extremities 03/22/2011   Metabolic alkalosis 03/28/2021   Microalbuminuria 04/20/2021   Migraine    monthly (12/05/2017)   Mild tricuspid regurgitation 04/02/2021   Mitral valve sclerosis 04/02/2021   Mixed hyperlipidemia 04/18/2020   Moderate aortic stenosis 04/20/2021   Mononeuropathy of lower extremity 04/09/2011   Formatting of this note might be different from the original.  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  Severe, corrected by CPAP 18 with C-flex of 3  2/13 bipap 20/14 sm ff mask h/h biflex+3cm  Formatting of this note might be different from the original.  Sleep study 06/2020: IMPRESSIONS  - Moderate obstructive sleep apnea occurred during this study (AHI = 17.7/h), mostly REM related  - No significant central sleep apnea occurred during   On home oxygen  therapy    2L all the time (12/05/2017)   On supplemental oxygen  by nasal cannula 04/20/2021   Onychomycosis 06/18/2019   OSA treated with BiPAP    have CPAP at home too; wearing BiPAP right now (12/05/2017)   Osteoarthritis 11/28/2010   Qualifier: Diagnosis of   By: Lelon RIGGERS, Scott       Other long term (current) drug therapy 02/12/2022   Paroxysmal atrial fibrillation (HCC) 09/04/2021   Physical debility 05/10/2018   Formatting of this note might be different from the original.  Due to morbid obesity   Physical deconditioning 04/24/2021   Pneumonia    several times (12/05/2017)   Pneumonia due to gram-positive bacteria 08/24/2017   Pressure injury of both heels, unstageable (HCC) 07/03/2021   Pulmonary edema, acute (HCC) 02/21/2022   Renal insufficiency 04/20/2021   Restless leg syndrome 03/22/2021   Restrictive lung disease secondary to obesity 01/28/2022   S/P hysterectomy 02/19/2021   Tinea pedis of both feet 11/12/2018    Unspecified atrial flutter (HCC) 02/12/2022   Upper GI bleed 07/18/2022   Venous insufficiency of both lower extremities 07/02/2021   Weakness 03/20/2021   Weight loss 03/15/2021    Medications:  Scheduled:   atorvastatin   20 mg Oral QPM   buPROPion  ER  100 mg Oral BID   busPIRone   7.5 mg Oral BID   Chlorhexidine  Gluconate Cloth  6 each Topical Daily   ferrous sulfate   325 mg Oral Daily   fluticasone  furoate-vilanterol  1 puff Inhalation Daily   And   umeclidinium bromide   1 puff Inhalation Daily   furosemide   80 mg Intravenous BID   insulin  aspart  0-15 Units Subcutaneous TID WC   levothyroxine   150 mcg Oral Q0600   loratadine   10 mg  Oral Daily   montelukast   10 mg Oral QHS   oseltamivir   75 mg Oral BID   pantoprazole   40 mg Oral Daily   sodium chloride  flush  10-40 mL Intracatheter Q12H    Assessment: 58 y.o. female with Aflutter, Eliquis  on hold, for heparin  drip until procedures complete Heparin  drip running in PIV and heparin  level dawn from PICC line Heparin  drip 1700 uts/hr with aptt 57sec < goal  No bleeding noted  Apixaban  falsely increases the heparin  level  - will use aptt to dose heparin     Goal of Therapy:  aPTT 66-102 sec Heparin  level 0.3-0.7 units/ml Monitor platelets by anticoagulation protocol: Yes   Plan:  Increase Heparin  1900 units/hr Daily aptt and cbc    Olam Chalk Pharm.D. CPP, BCPS Clinical Pharmacist [phone] 11/07/2023 8:57 PM

## 2023-11-07 NOTE — Assessment & Plan Note (Addendum)
 Patient febrile yesterday evening and antibiotics were broadened to vanc (2/5) and cefepime  (2/5).  Patient afebrile this morning and RVP positive for H1N1. Abx were narrowed back to ceftriaxone  only this morning. Patient more alert on exam, high flow O2 during evaluation. Discussed option of consulting pulm for potential trach placement and patient is considering. Etiology likely multifactorial complicated by patients minimal respiratory reserve due to OSA/OHS. Considering pneumonia with risk factors for drug resistance, aspiration pneumonitis vs CHF exacerbation.  - Continuous pulse ox - Continue BiPAP while sleeping/naps, wean as tolerated to O2goal of 88-92% - NPO while on BiPAP  - Completed azithromycin  (2/2-2/4) and will continue ceftriaxone  (2/2-7) for CAP coverage  - Continue holding oxycodone  and do not administer benzos, given respiratory status and altered mentation/somnolence throughout admission  - AM CBC, BMP  - Legionella negative  - Consider pulm consult if patient willing to discuss trach further

## 2023-11-08 DIAGNOSIS — I5031 Acute diastolic (congestive) heart failure: Secondary | ICD-10-CM

## 2023-11-08 DIAGNOSIS — J441 Chronic obstructive pulmonary disease with (acute) exacerbation: Secondary | ICD-10-CM

## 2023-11-08 DIAGNOSIS — J101 Influenza due to other identified influenza virus with other respiratory manifestations: Secondary | ICD-10-CM | POA: Diagnosis not present

## 2023-11-08 DIAGNOSIS — G4733 Obstructive sleep apnea (adult) (pediatric): Secondary | ICD-10-CM | POA: Diagnosis not present

## 2023-11-08 DIAGNOSIS — J9622 Acute and chronic respiratory failure with hypercapnia: Secondary | ICD-10-CM

## 2023-11-08 DIAGNOSIS — J9621 Acute and chronic respiratory failure with hypoxia: Secondary | ICD-10-CM | POA: Diagnosis not present

## 2023-11-08 DIAGNOSIS — J9601 Acute respiratory failure with hypoxia: Secondary | ICD-10-CM | POA: Diagnosis not present

## 2023-11-08 DIAGNOSIS — I509 Heart failure, unspecified: Secondary | ICD-10-CM | POA: Diagnosis not present

## 2023-11-08 LAB — BASIC METABOLIC PANEL
Anion gap: 10 (ref 5–15)
Anion gap: 10 (ref 5–15)
BUN: 18 mg/dL (ref 6–20)
BUN: 16 mg/dL (ref 6–20)
CO2: 36 mmol/L — ABNORMAL HIGH (ref 22–32)
CO2: 35 mmol/L — ABNORMAL HIGH (ref 22–32)
Calcium: 8.1 mg/dL — ABNORMAL LOW (ref 8.9–10.3)
Calcium: 8.3 mg/dL — ABNORMAL LOW (ref 8.9–10.3)
Chloride: 92 mmol/L — ABNORMAL LOW (ref 98–111)
Chloride: 92 mmol/L — ABNORMAL LOW (ref 98–111)
Creatinine, Ser: 1.01 mg/dL — ABNORMAL HIGH (ref 0.44–1.00)
Creatinine, Ser: 0.82 mg/dL (ref 0.44–1.00)
GFR, Estimated: >60 mL/min (ref >60)
GFR, Estimated: >60 mL/min (ref >60)
Glucose, Bld: 160 mg/dL — ABNORMAL HIGH (ref 70–99)
Glucose, Bld: 242 mg/dL — ABNORMAL HIGH (ref 70–99)
Potassium: 3.6 mmol/L (ref 3.5–5.1)
Potassium: 3.8 mmol/L (ref 3.5–5.1)
Sodium: 138 mmol/L (ref 135–145)
Sodium: 137 mmol/L (ref 135–145)

## 2023-11-08 LAB — CBC
HCT: 36.8 % (ref 36.0–46.0)
Hemoglobin: 11.2 g/dL — ABNORMAL LOW (ref 12.0–15.0)
MCH: 26.7 pg (ref 26.0–34.0)
MCHC: 30.4 g/dL (ref 30.0–36.0)
MCV: 87.6 fL (ref 80.0–100.0)
Platelets: 252 10*3/uL (ref 150–400)
RBC: 4.2 MIL/uL (ref 3.87–5.11)
RDW: 15.5 % (ref 11.5–15.5)
WBC: 7.1 10*3/uL (ref 4.0–10.5)
nRBC: 0 % (ref 0.0–0.2)

## 2023-11-08 LAB — APTT
aPTT: 69 s — ABNORMAL HIGH (ref 24–36)
aPTT: 39 s — ABNORMAL HIGH (ref 24–36)
aPTT: 58 s — ABNORMAL HIGH (ref 24–36)

## 2023-11-08 LAB — HEPARIN LEVEL (UNFRACTIONATED): Heparin Unfractionated: >1.1 [IU]/mL — ABNORMAL HIGH (ref 0.30–0.70)

## 2023-11-08 LAB — GLUCOSE, CAPILLARY
Glucose-Capillary: 139 mg/dL — ABNORMAL HIGH (ref 70–99)
Glucose-Capillary: 142 mg/dL — ABNORMAL HIGH (ref 70–99)
Glucose-Capillary: 224 mg/dL — ABNORMAL HIGH (ref 70–99)
Glucose-Capillary: 191 mg/dL — ABNORMAL HIGH (ref 70–99)

## 2023-11-08 LAB — MAGNESIUM
Magnesium: 1.8 mg/dL (ref 1.7–2.4)
Magnesium: 2.3 mg/dL (ref 1.7–2.4)

## 2023-11-08 MED ORDER — MELATONIN 3 MG PO TABS
3.0000 mg | ORAL_TABLET | Freq: Every evening | ORAL | Status: DC | PRN
  Administered 2023-11-08 – 2023-11-15 (×9): 3 mg via ORAL
  Filled 2023-11-08 (×9): qty 1

## 2023-11-08 MED ORDER — MAGNESIUM SULFATE 2 GM/50ML IV SOLN
2.0000 g | Freq: Once | INTRAVENOUS | Status: AC
  Administered 2023-11-08: 2 g via INTRAVENOUS
  Filled 2023-11-08: qty 50

## 2023-11-08 MED ORDER — ROPINIROLE HCL 0.5 MG PO TABS
0.5000 mg | ORAL_TABLET | Freq: Every day | ORAL | Status: DC
  Administered 2023-11-08 – 2023-11-15 (×8): 0.5 mg via ORAL
  Filled 2023-11-08 (×8): qty 1

## 2023-11-08 MED ORDER — ORAL CARE MOUTH RINSE
15.0000 mL | OROMUCOSAL | Status: DC
  Administered 2023-11-08 – 2023-11-15 (×23): 15 mL via OROMUCOSAL

## 2023-11-08 MED ORDER — FUROSEMIDE 10 MG/ML IJ SOLN: 80.0000 mg | Freq: Two times a day (BID) | INTRAMUSCULAR | Status: DC

## 2023-11-08 MED ORDER — FUROSEMIDE 10 MG/ML IJ SOLN
80.0000 mg | Freq: Once | INTRAMUSCULAR | Status: AC
  Administered 2023-11-08: 80 mg via INTRAVENOUS
  Filled 2023-11-08: qty 8

## 2023-11-08 MED ORDER — DIGOXIN 0.25 MG/ML IJ SOLN
0.2500 mg | Freq: Once | INTRAMUSCULAR | Status: AC
  Administered 2023-11-08: 0.25 mg via INTRAVENOUS
  Filled 2023-11-08: qty 1

## 2023-11-08 MED ORDER — IBUPROFEN 600 MG PO TABS
600.0000 mg | ORAL_TABLET | Freq: Once | ORAL | Status: AC
  Administered 2023-11-08: 600 mg via ORAL
  Filled 2023-11-08: qty 1

## 2023-11-08 MED ORDER — POTASSIUM CHLORIDE CRYS ER 20 MEQ PO TBCR
40.0000 meq | EXTENDED_RELEASE_TABLET | Freq: Once | ORAL | Status: AC
  Administered 2023-11-08: 40 meq via ORAL
  Filled 2023-11-08: qty 4

## 2023-11-08 MED ORDER — METOLAZONE 5 MG PO TABS
2.5000 mg | ORAL_TABLET | Freq: Two times a day (BID) | ORAL | Status: DC
Start: 2023-11-08 — End: 2023-11-08

## 2023-11-08 MED ORDER — ORAL CARE MOUTH RINSE: 15.0000 mL | OROMUCOSAL | Status: DC | PRN

## 2023-11-08 MED ORDER — METOLAZONE 5 MG PO TABS
2.5000 mg | ORAL_TABLET | Freq: Two times a day (BID) | ORAL | Status: DC
  Administered 2023-11-08 – 2023-11-11 (×6): 2.5 mg via ORAL
  Filled 2023-11-08 (×6): qty 1

## 2023-11-08 MED ORDER — METOLAZONE 5 MG PO TABS
2.5000 mg | ORAL_TABLET | Freq: Once | ORAL | Status: AC
  Administered 2023-11-08: 2.5 mg via ORAL
  Filled 2023-11-08: qty 1

## 2023-11-08 MED ORDER — FUROSEMIDE 10 MG/ML IJ SOLN
80.0000 mg | Freq: Two times a day (BID) | INTRAMUSCULAR | Status: DC
Start: 2023-11-08 — End: 2023-11-12
  Administered 2023-11-08 – 2023-11-11 (×7): 80 mg via INTRAVENOUS
  Filled 2023-11-08 (×8): qty 8

## 2023-11-08 NOTE — Progress Notes (Signed)
   Patient Name: Chelsea [name] Date of Encounter: 11/08/2023 [facility] Cardiologist: Oneil Parchment, MD   Interval Summary  .    Laying in bed, sheet over her.  States that she is feeling a little bit better.  Vital Signs .    Vitals:   11/08/23 0402 11/08/23 0651 11/08/23 0751 11/08/23 0752  BP: 97/67   (!) 109/91  Pulse: 72 73  71  Resp: 18 (!) 21  20  Temp: 98.1 F (36.7 C)   98.5 F (36.9 C)  TempSrc: Axillary   Oral  SpO2: 97% 94% 90% 90%  Weight: (!) 149.7 kg     Height:        Intake/Output Summary (Last 24 hours) at 11/08/2023 0949 Last data filed at 11/08/2023 9390 Gross per 24 hour  Intake 1589.19 ml  Output 2550 ml  Net -960.81 ml      11/08/2023    4:02 AM 11/07/2023    4:43 AM 11/04/2023   11:39 PM  Last 3 Weights  Weight (lbs) 330 lb 0.5 oz 326 lb 11.6 oz 335 lb 15.7 oz  Weight (kg) 149.7 kg 148.2 kg 152.4 kg      Telemetry/ECG    Aflutter 120- Personally Reviewed  Physical Exam .   GEN: No acute distress.   Neck: No JVD Cardiac: Irregularly irregular, 1/6 systolic murmur, no rubs, or gallops.  Respiratory: Crackles bilaterally. GI: Soft, nontender, non-distended  MS: No edema  Assessment & Plan .     58 year old female with multiple readmissions, multiple comorbidities including chronic diastolic heart failure, morbid obesity, BMI 55, atrial fibrillation flutter on Eliquis , moderate aortic stenosis, COPD, chronic respiratory failure, obesity hypoventilation syndrome on chronic O2, BiPAP, hyperlipidemia, type 2 diabetes, deconditioning.  We were asked to see her secondary to her atrial flutter with rapid ventricular response.  Atrial fibrillation/flutter with rapid ventricular response - IV amiodarone  being utilized.  In the past she has converted with IV amiodarone  and sometimes requires additional digoxin .  Will try to hold off digoxin . -Continue with IV amiodarone  for now.  Careful longitudinal monitoring of this high risk medication.   Atrial flutter on telemetry personally reviewed shows heart rates between 101 120. -She is not a candidate for ablation  Chronic anticoagulation - She has missed a dose of Eliquis  or 2.  I would recommend transitioning her from heparin  IV over to Eliquis  p.o.  Chronic diastolic heart failure - Multifactorial including morbid obesity.  Consider continuation of loop diuretic. -EF 65% on most recent echocardiogram with moderately dilated left atrium. -BNP 411 hemoglobin 11.6 creatinine 0.7 potassium 3.3-replete troponin 11 -Patchy lung fields noticed on chest x-ray personally reviewed and interpreted.  Flu A - Pneumonia cough For questions or updates, please contact  [facility] Please consult [email or web address] for contact info under        Signed, Oneil Parchment, MD

## 2023-11-08 NOTE — Progress Notes (Signed)
 PHARMACY - ANTICOAGULATION CONSULT NOTE  Pharmacy Consult for Heparin  Indication: atrial flutter  Allergies  Allergen Reactions   Iodinated Contrast Media Anaphylaxis   Penicillins Anaphylaxis    Patient tolerates cefepime  (08/2017)   Insulin  Lispro Other (See Comments)    Adder per [facility] from SNF   Minoxidil Hives    Patient Measurements: Height: 5' 5 (165.1 cm) Weight: (!) 149.7 kg (330 lb 0.5 oz) IBW/kg (Calculated) : 57 Heparin  Dosing Weight: 95 kg  Vital Signs: Temp: 98.5 F (36.9 C) (02/07 1206) Temp Source: Oral (02/07 1206) BP: 104/75 (02/07 1206) Pulse Rate: 54 (02/07 1206)  Labs: Recent Labs    11/06/23 1602 11/06/23 2333 11/07/23 0451 11/07/23 1746 11/08/23 0450 11/08/23 1300  HGB 11.1*  --  11.6*  --  11.2*  --   HCT 37.2  --  37.9  --  36.8  --   PLT 252  --  224  --  252  --   APTT  --    < >  --  57* 69* 39*  HEPARINUNFRC  --   --   --   --  >1.10*  --   CREATININE 0.78  --  0.70  --  1.01*  --    < > = values in this interval not displayed.    Estimated Creatinine Clearance: 89.1 mL/min (A) (by C-G formula based on SCr of 1.01 mg/dL (H)).   Medical History: Past Medical History:  Diagnosis Date   Acquired hypothyroidism 11/13/2010   Qualifier: Diagnosis of   By: Pietro, MD, CODY Redell Dimes      Acute congestive heart failure (HCC) 03/18/2021   Acute idiopathic gout of left hand 02/16/2019   Acute kidney injury (HCC) 05/29/2023   Acute metabolic encephalopathy 09/29/2023   Acute on chronic diastolic CHF (congestive heart failure), NYHA class 3 (HCC) 09/25/2017   Acute on chronic respiratory failure with hypoxia and hypercapnia (HCC) 04/07/2022   Arthritis    qwhere (12/05/2017)   Asthma    Atrial flutter (HCC) 03/09/2021   Bleeding of the respiratory tract 04/07/2022   Bronchitis 05/30/2011   Cervical cancer (HCC) 2006   CHF exacerbation (HCC) 07/30/2022   Chronic heart failure with preserved ejection fraction (HCC) 02/23/2018    Chronic lower back pain    Chronic respiratory failure with hypoxia (HCC) 07/02/2018   ABG 11/2017 7.36/69   Consistent with obesity hypoventilation syndrome   Complication of anesthesia    I have a hard time waking up from under it (12/05/2017)   COPD (chronic obstructive pulmonary disease) (HCC) 03/04/2015   Coronary artery disease    Cough productive of clear sputum 06/03/2023   Diarrhea 06/18/2019   DJD (degenerative joint disease) 07/03/2021   DM2 (diabetes mellitus, type 2) (HCC) 11/10/2010   Dyspnea 12/05/2017   Dyspnea on exertion 02/09/2021   Edema 03/22/2011   Essential (primary) hypertension 11/10/2010   Formatting of this note might be different from the original.  Overview:   Qualifier: Diagnosis of   By: Gwenn Grimes      Last Assessment & Plan:   Watch blood pressure off of minoxidil and add medications as needed.   Essential hypertension 11/10/2010   Qualifier: Diagnosis of   By: Gwenn Grimes       Gastro-esophageal reflux disease without esophagitis 11/10/2010   GERD 11/10/2010   Qualifier: Diagnosis of   By: Gwenn Grimes       Hair loss 09/17/2019   Heart failure (HCC) 04/20/2021  Heart murmur    Hepatitis C    History of cervical cancer 03/22/2011   History of gout X 1   History of tobacco use 04/20/2021   Hx of atrial flutter 03/15/2021   Hyperglycemia due to type 2 diabetes mellitus (HCC) 04/20/2021   Hyperlipidemia    Hypertension associated with diabetes (HCC) 08/25/2018   Irritable bowel syndrome with diarrhea 06/18/2019   Limitation of activity due to disability 09/18/2021   Lymphedema of both lower extremities 03/22/2011   Metabolic alkalosis 03/28/2021   Microalbuminuria 04/20/2021   Migraine    monthly (12/05/2017)   Mild tricuspid regurgitation 04/02/2021   Mitral valve sclerosis 04/02/2021   Mixed hyperlipidemia 04/18/2020   Moderate aortic stenosis 04/20/2021   Mononeuropathy of lower extremity 04/09/2011   Formatting of  this note might be different from the original.  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  Severe, corrected by CPAP 18 with C-flex of 3  2/13 bipap 20/14 sm ff mask h/h biflex+3cm  Formatting of this note might be different from the original.  Sleep study 06/2020: IMPRESSIONS  - Moderate obstructive sleep apnea occurred during this study (AHI = 17.7/h), mostly REM related  - No significant central sleep apnea occurred during   On home oxygen  therapy    2L all the time (12/05/2017)   On supplemental oxygen  by nasal cannula 04/20/2021   Onychomycosis 06/18/2019   OSA treated with BiPAP    have CPAP at home too; wearing BiPAP right now (12/05/2017)   Osteoarthritis 11/28/2010   Qualifier: Diagnosis of   By: Lelon RIGGERS, Scott       Other long term (current) drug therapy 02/12/2022   Paroxysmal atrial fibrillation (HCC) 09/04/2021   Physical debility 05/10/2018   Formatting of this note might be different from the original.  Due to morbid obesity   Physical deconditioning 04/24/2021   Pneumonia    several times (12/05/2017)   Pneumonia due to gram-positive bacteria 08/24/2017   Pressure injury of both heels, unstageable (HCC) 07/03/2021   Pulmonary edema, acute (HCC) 02/21/2022   Renal insufficiency 04/20/2021   Restless leg syndrome 03/22/2021   Restrictive lung disease  secondary to obesity 01/28/2022   S/P hysterectomy 02/19/2021   Tinea pedis of both feet 11/12/2018   Unspecified atrial flutter (HCC) 02/12/2022   Upper GI bleed 07/18/2022   Venous insufficiency of both lower extremities 07/02/2021   Weakness 03/20/2021   Weight loss 03/15/2021    Medications:  Scheduled:   atorvastatin   20 mg Oral QPM   buPROPion  ER  100 mg Oral BID   busPIRone   7.5 mg Oral BID   Chlorhexidine  Gluconate Cloth  6 each Topical Daily   ferrous sulfate   325 mg Oral Daily   fluticasone  furoate-vilanterol  1 puff Inhalation Daily   And   umeclidinium bromide   1 puff Inhalation Daily  insulin  aspart  0-15 Units Subcutaneous TID WC   levothyroxine   150 mcg Oral Q0600   loratadine   10 mg Oral Daily   montelukast   10 mg Oral QHS   mouth rinse  15 mL Mouth Rinse 4 times per day   oseltamivir   75 mg Oral BID   pantoprazole   40 mg Oral Daily   rOPINIRole   0.5 mg Oral QHS   sodium chloride  flush  10-40 mL Intracatheter Q12H    Assessment: 58 y.o. female with Aflutter, Eliquis  on hold, for heparin  pending decision regarding possible trach for hypercarbia.   2/7 AM: Aptt 69- therapeutic with heparin  running at 1900 units/hour. Heparin  level falsely elevated d/t recent apixaban  use. CBC remains stable. No signs of bleeding or issues with he heparin  infusion noted.   Goal of Therapy:  aPTT 66-102 sec Heparin  level 0.3-0.7 units/ml Monitor platelets by anticoagulation protocol: Yes   Plan:  Continue Heparin  1900 units/hr Check aPTT in 8 hours   Massie Fila, PharmD Clinical Pharmacist  11/08/2023 2:20 PM   ADDENDUM:  2/7 PM: aPTT on recheck now subtherapeutic at 39s with heparin  running at 1,900 units/hour. No issues with heparin  infusion reported.   - Increase heparin  to 2200 units/hour  - Recheck aPTT in 8 hours   Massie Fila, PharmD Clinical Pharmacist  11/08/2023 2:30 PM

## 2023-11-08 NOTE — Consult Note (Signed)
 NAME:  Chelsea [name], MRN:  [id number], DOB:  03/13/1966, LOS: 4 ADMISSION DATE:  11/03/2023, CONSULTATION DATE:  2/7 REFERRING MD:  Chelsea [name], CHIEF COMPLAINT:  acute on chronic resp failure severe OSA and possible trach    History of Present Illness:  This is a 58 year old female patient with a significant history of morbid obesity, chronic hypoxic  failure in the setting of OSA and probable element of OHS, moderate aortic stenosis, PAF, deconditioning, and recurrent hospital admissions for acute hypoxic and hypercarbic respiratory failure secondary to acute diastolic heart failure, following decompensated cor pulmonale and volume overload from her underlying OSA/OHS (she has had 6 admissions in 6 months for this). Admitted this time with again recurrent respiratory failure this time likely Mixed picture of influenza A infection and further acute decompensation of her chronic cor pulmonale and diastolic disease.  She has been on noninvasive positive pressure ventilation intermittently since her day of admission on 2/2 with some difficulty weaning off.  She has had ongoing trouble with BiPAP compliance, and like previous hospital admissions it has been recommended that she would benefit from tracheostomy.  Pulmonary was asked to discuss the benefits of tracheostomy for this patient with her and her husband  Pertinent  Medical History  MO, acquired hypothyroidism, chronic heart failure, idiopathic gout involving left hand, prior AKI, diastolic dysfunction, [place]  heart association class III dyspnea, chronic hypoxic respiratory failure, recurrent acute on chronic hypoxic and hypercarbic respiratory failure, arthritis, asthma, atrial flutter, prior tracheostomy complicated by tracheal site bleeding, cervical cancer, frequent diastolic heart failure exacerbations, coronary artery disease, degenerative joint disease, diabetes type 2, hypertension, dependent edema, GERD, IBS, lymphedema of lower extremities,  mixed hyperlipidemia, severe OSA, prior pressure ulcers Significant Hospital Events: Including procedures, antibiotic start and stop dates in addition to other pertinent events   2/2 admitted 2/7 pulmonary asked to evaluate and discuss benefits of tracheostomy  Interim History / Subjective:  Laying in bed no distress able to tolerate short periods of time off BiPAP  Objective   Blood pressure 104/75, pulse (!) 54, temperature 98.5 F (36.9 C), temperature source Oral, resp. rate 20, height 5' 5 (1.651 m), weight (!) 149.7 kg, SpO2 98%.    FiO2 (%):  [60 %] 60 %   Intake/Output Summary (Last 24 hours) at 11/08/2023 1344 Last data filed at 11/08/2023 1033 Gross per 24 hour  Intake 1829.19 ml  Output 1500 ml  Net 329.19 ml   Filed Weights   11/04/23 2339 11/07/23 0443 11/08/23 0402  Weight: (!) 152.4 kg (!) 148.2 kg (!) 149.7 kg    Examination: General: Morbidly obese 58 year old female currently on BiPAP but in no acute distress HENT: Neck is large unable to assess JVD BiPAP mask in place Lungs: Diminished throughout, decreased bases currently on noninvasive Cardiovascular: Regular irregular atrial fibrillation Abdomen: Large Extremities: Chronic venous stasis changes with lymphedema and red discoloration of her lower extremities Neuro: Awake alert no focal deficits  Resolved Hospital Problem list     Assessment & Plan:  Acute on chronic hypoxic and hypercarbic respiratory failure Morbid obesity Obstructive sleep apnea Obesity hypoventilation syndrome History of asthma Type III pulmonary hypertension Acute cor pulmonale Acute diastolic heart failure Pulmonary edema History of GERD Poor tolerance of BiPAP Atrial flutter Influenza A Type 2 diabetes Anemia Hyperlipidemia  Hypothyroidism chronic pain  Pulmonary problem list Acute on chronic hypoxic and hypercarbic respiratory failure secondary to acute cor pulmonale, complicated by acute diastolic heart failure in  the setting of underlying  severe OSA and OHS  Discussion  This was complicated by influenza A but almost certainly has underlying type III pulmonary hypertension although her body habitus would be difficult to confirm this without right heart cath -Has a fair amount of difficulty with BiPAP and as far as tolerance and compliance -Had previous tracheostomy, and on past discussions had not wanted to consider this again given episode of tracheostomy site complication and bleeding during 1 hospitalization -Unfortunately it seems as though CPAP/BiPAP is ineffective therapy for Chelsea [name], and without better treatment of her underlying sleep apnea she will almost certainly be readmitted again with the same problems.  In prior hospital admits the hypercarbia component typically only occurred during decompensation, so by better treating her sleep apnea she may not require nocturnal positive pressure  Plan/recommendation We have again recommended tracheostomy Reasonable to give her over the weekend to discuss this with her husband Also to maximize medical therapy in regards to her volume status and also recover for [facility] influenza infection Given her body habitus she would be best served by ENT tracheostomy in the operating room She can come to the ICU post tracheostomy for recovery and ventilator weaning Continue to push diuresis as able to tolerate  Best Practice (right click and Reselect all SmartList Selections daily)   Per primary   Labs   CBC: Recent Labs  Lab 11/04/23 0526 11/05/23 0614 11/06/23 0415 11/06/23 1602 11/07/23 0451 11/08/23 0450  WBC 10.1 8.8 6.7 6.3 6.2 7.1  NEUTROABS 9.1* 6.2  --   --   --   --   HGB 11.9* 11.0* 9.7* 11.1* 11.6* 11.2*  HCT 39.8 37.6 31.8* 37.2 37.9 36.8  MCV 90.2 91.0 90.6 89.6 87.7 87.6  PLT 210 258 250 252 224 252    Basic Metabolic Panel: Recent Labs  Lab 11/05/23 0614 11/06/23 0545 11/06/23 1602 11/07/23 0451 11/08/23 0450  NA 137  138 138 137 138  K 3.8 3.5 3.9 3.3* 3.6  CL 91* 91* 89* 90* 92*  CO2 33* 37* 38* 34* 36*  GLUCOSE 162* 127* 167* 141* 160*  BUN 38* 28* 22* 16 18  CREATININE 0.94 0.72 0.78 0.70 1.01*  CALCIUM  8.9 8.5* 8.3* 8.0* 8.1*  MG 2.2 2.0 1.9 2.0 1.8   GFR: Estimated Creatinine Clearance: 89.1 mL/min (A) (by C-G formula based on SCr of 1.01 mg/dL (H)). Recent Labs  Lab 11/03/23 1956 11/04/23 0526 11/06/23 0415 11/06/23 1602 11/07/23 0451 11/08/23 0450  WBC  --    < > 6.7 6.3 6.2 7.1  LATICACIDVEN 1.1  --   --   --   --   --    < > = values in this interval not displayed.    Liver Function Tests: Recent Labs  Lab 11/05/23 0614  AST 13*  ALT 12  ALKPHOS 83  BILITOT 0.4  PROT 7.2  ALBUMIN  2.8*   No results for input(s): LIPASE, AMYLASE in the last 168 hours. No results for input(s): AMMONIA in the last 168 hours.  ABG    Component Value Date/Time   PHART 7.349 (L) 08/15/2023 1402   PCO2ART 96.6 (HH) 08/15/2023 1402   PO2ART 54 (L) 08/15/2023 1402   HCO3 42.2 (H) 11/06/2023 0445   TCO2 42 (H) 11/03/2023 1955   ACIDBASEDEF 7.9 (H) 09/20/2023 2156   O2SAT 74.8 11/06/2023 0445     Coagulation Profile: No results for input(s): INR, PROTIME in the last 168 hours.  Cardiac Enzymes: No results for input(s): CKTOTAL, CKMB, CKMBINDEX,  TROPONINI in the last 168 hours.  HbA1C: Hgb A1c MFr Bld  Date/Time Value Ref Range Status  08/16/2023 04:19 AM 9.3 (H) 4.8 - 5.6 % Final    Comment:    (NOTE) Pre diabetes:          5.7%-6.4%  Diabetes:              >6.4%  Glycemic control for   <7.0% adults with diabetes   05/29/2023 11:43 AM 8.5 (H) 4.8 - 5.6 % Final    Comment:    (NOTE) Pre diabetes:          5.7%-6.4%  Diabetes:              >6.4%  Glycemic control for   <7.0% adults with diabetes     CBG: Recent Labs  Lab 11/07/23 1605 11/07/23 2114 11/08/23 0705 11/08/23 0751 11/08/23 1204  GLUCAP 109* 251* 139* 142* 224*    Review of  Systems:   Review of Systems  Constitutional:  Positive for malaise/fatigue.  HENT: Negative.    Eyes: Negative.   Respiratory:  Positive for cough, shortness of breath and wheezing. Negative for sputum production.   Cardiovascular:  Positive for leg swelling.  Gastrointestinal: Negative.   Genitourinary: Negative.   Musculoskeletal:  Positive for myalgias.  Skin: Negative.   Neurological: Negative.   Endo/Heme/Allergies: Negative.      Past Medical History:  She,  has a past medical history of Acquired hypothyroidism (11/13/2010), Acute congestive heart failure (HCC) (03/18/2021), Acute idiopathic gout of left hand (02/16/2019), Acute kidney injury (HCC) (05/29/2023), Acute metabolic encephalopathy (09/29/2023), Acute on chronic diastolic CHF (congestive heart failure), NYHA class 3 (HCC) (09/25/2017), Acute on chronic respiratory failure with hypoxia and hypercapnia (HCC) (04/07/2022), Arthritis, Asthma, Atrial flutter (HCC) (03/09/2021), Bleeding of the respiratory tract (04/07/2022), Bronchitis (05/30/2011), Cervical cancer (HCC) (2006), CHF exacerbation (HCC) (07/30/2022), Chronic heart failure with preserved ejection fraction (HCC) (02/23/2018), Chronic lower back pain, Chronic respiratory failure with hypoxia (HCC) (07/02/2018), Complication of anesthesia, COPD (chronic obstructive pulmonary disease) (HCC) (03/04/2015), Coronary artery disease, Cough productive of clear sputum (06/03/2023), Diarrhea (06/18/2019), DJD (degenerative joint disease) (07/03/2021), DM2 (diabetes mellitus, type 2) (HCC) (11/10/2010), Dyspnea (12/05/2017), Dyspnea on exertion (02/09/2021), Edema (03/22/2011), Essential (primary) hypertension (11/10/2010), Essential hypertension (11/10/2010), Gastro-esophageal reflux disease without esophagitis (11/10/2010), GERD (11/10/2010), Hair loss (09/17/2019), Heart failure (HCC) (04/20/2021), Heart murmur, Hepatitis C, History of cervical cancer (03/22/2011), History of gout  (X 1), History of tobacco use (04/20/2021), atrial flutter (03/15/2021), Hyperglycemia due to type 2 diabetes mellitus (HCC) (04/20/2021), Hyperlipidemia, Hypertension associated with diabetes (HCC) (08/25/2018), Irritable bowel syndrome with diarrhea (06/18/2019), Limitation of activity due to disability (09/18/2021), Lymphedema of both lower extremities (03/22/2011), Metabolic alkalosis (03/28/2021), Microalbuminuria (04/20/2021), Migraine, Mild tricuspid regurgitation (04/02/2021), Mitral valve sclerosis (04/02/2021), Mixed hyperlipidemia (04/18/2020), Moderate aortic stenosis (04/20/2021), Mononeuropathy of lower extremity (04/09/2011), Morbid (severe) obesity due to excess calories (HCC) (03/22/2011), Morbid obesity with BMI of 50.0-59.9, adult (HCC) (04/02/2021), Multifocal pneumonia (resolved - awaiting placement) (06/14/2023), Neuropathic pain, leg (04/09/2011), 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), Nocturnal hypoxemia (06/03/2020), Non-smoker (04/02/2021), Obesity hypoventilation syndrome (HCC), Obstructive sleep apnea (11/10/2010), Obstructive sleep apnea (adult) (pediatric) (11/10/2010), On home oxygen  therapy, On supplemental oxygen  by nasal cannula (04/20/2021), Onychomycosis (06/18/2019), OSA treated with BiPAP, Osteoarthritis (11/28/2010), Other long term (current) drug therapy (02/12/2022), Paroxysmal atrial fibrillation (HCC) (09/04/2021), Physical debility (05/10/2018), Physical deconditioning (04/24/2021), Pneumonia, Pneumonia due to gram-positive bacteria (08/24/2017), Pressure injury of both heels, unstageable (HCC) (07/03/2021), Pulmonary edema,  acute (HCC) (02/21/2022), Renal insufficiency (04/20/2021), Restless leg syndrome (03/22/2021), Restrictive lung disease secondary to obesity (01/28/2022), S/P hysterectomy (02/19/2021), Tinea pedis of both feet (11/12/2018), Unspecified atrial flutter (HCC) (02/12/2022), Upper GI bleed (07/18/2022), Venous insufficiency of both  lower extremities (07/02/2021), Weakness (03/20/2021), and Weight loss (03/15/2021).   Surgical History:   Past Surgical History:  Procedure Laterality Date   ABDOMINAL SURGERY  2006 X 2   TAH incision was infected   BIOPSY  07/21/2022   Procedure: BIOPSY;  Surgeon: Elicia Claw, MD;  Location: [facility];  Service: Gastroenterology;;   CHOLECYSTECTOMY OPEN     ESOPHAGOGASTRODUODENOSCOPY N/A 07/21/2022   Procedure: ESOPHAGOGASTRODUODENOSCOPY (EGD);  Surgeon: Elicia Claw, MD;  Location: [facility];  Service: Gastroenterology;  Laterality: N/A;   TONSILLECTOMY     TOTAL ABDOMINAL HYSTERECTOMY  2006   TRACHEOSTOMY REVISION N/A 04/09/2022   Procedure: CONTROL OF BLEEDING,TRACHEOSTOMY SITE;  Surgeon: Carlie Clark, MD;  Location: [facility] OR;  Service: ENT;  Laterality: N/A;     Social History:   reports that she has never smoked. She has never used smokeless tobacco. She reports that she does not currently use alcohol. She reports that she does not use drugs.   Family History:  Her family history includes Heart disease in her father.   Allergies Allergies  Allergen Reactions   Iodinated Contrast Media Anaphylaxis   Penicillins Anaphylaxis    Patient tolerates cefepime  (08/2017)   Insulin  Lispro Other (See Comments)    Adder per MAR from SNF   Minoxidil Hives     Home Medications  Prior to Admission medications   Medication Sig Start Date End Date Taking? Authorizing Provider  albuterol  (PROVENTIL ) (2.5 MG/3ML) 0.083% nebulizer solution Take 3 mLs (2.5 mg total) by nebulization every 6 (six) hours as needed for wheezing or shortness of breath (I50.32). 07/04/16  Yes Jude Harden GAILS, MD  ALPRAZolam  (XANAX ) 0.5 MG tablet Take 0.5 mg by mouth 2 (two) times daily as needed for anxiety. 09/05/23  Yes [provider]  amiodarone  (PACERONE ) 200 MG tablet Take 1 tablet (200 mg total) by mouth daily. 06/21/23  Yes Bryan Bianchi, MD  atorvastatin  (LIPITOR) 20 MG tablet Take  20 mg by mouth every evening.   Yes [provider]  buPROPion  ER (WELLBUTRIN  SR) 100 MG 12 hr tablet Take 1 tablet (100 mg total) by mouth 2 (two) times daily. 06/08/23  Yes Bryan Bianchi, MD  busPIRone  (BUSPAR ) 7.5 MG tablet Take 1 tablet (7.5 mg total) by mouth 2 (two) times daily. 10/07/23  Yes Howell Lunger, DO  Cholecalciferol  125 MCG (5000 UT) TABS Take 1 tablet (5,000 Units total) by mouth daily. 07/10/22  Yes [name], Donalda HERO, MD  diclofenac  Sodium (VOLTAREN ) 1 % GEL Apply 2-4 g topically in the morning and at bedtime. Apply to bilateral knees/L shoulder topically twice a day for arthritis 4g dose lower extremities, 2g dose upper extremities   Yes [provider]  ELIQUIS  5 MG TABS tablet Take 5 mg by mouth 2 (two) times daily. 04/12/21  Yes [provider]  empagliflozin  (JARDIANCE ) 25 MG TABS tablet Take 1 tablet (25 mg total) by mouth daily. 08/20/23  Yes Alba Sharper, MD  ferrous sulfate  325 (65 FE) MG EC tablet Take 325 mg by mouth daily.   Yes [provider]  Fluticasone -Umeclidin-Vilant (TRELEGY ELLIPTA ) 100-62.5-25 MCG/ACT AEPB Inhale 1 puff into the lungs daily. 10/04/23  Yes Jagadish, Mayuri, MD  insulin  glargine (LANTUS ) 100 UNIT/ML Solostar Pen Inject 35 Units into the  skin in the morning. 10/04/23  Yes Jagadish, Mayuri, MD  insulin  glargine-yfgn (SEMGLEE ) 100 UNIT/ML injection Inject 0.4 mLs (40 Units total) into the skin at bedtime. 10/06/23  Yes Marlee Lynwood NOVAK, MD  levothyroxine  (SYNTHROID ) 150 MCG tablet Take 1 tablet (150 mcg total) by mouth daily at 6 (six) AM. Patient taking differently: Take 150 mcg by mouth daily. 07/10/22  Yes [name], Donalda HERO, MD  loperamide  (IMODIUM ) 2 MG capsule Take 2 mg by mouth every 4 (four) hours as needed for diarrhea or loose stools.   Yes [provider]  montelukast  (SINGULAIR ) 10 MG tablet Take 10 mg by mouth at bedtime.   Yes [provider]  Multiple Vitamin (MULTIVITAMIN WITH  MINERALS) TABS tablet Take 1 tablet by mouth daily.   Yes [provider]  NON FORMULARY Apply BIPAP every night at bedtime.   Yes [provider]  omeprazole  (PRILOSEC) 20 MG capsule Take 20 mg by mouth daily. 10/02/22  Yes [provider]  oxyCODONE -acetaminophen  (PERCOCET) 10-325 MG tablet Take 1 tablet by mouth every 6 (six) hours. 06/21/23  Yes Bryan Bianchi, MD  OXYGEN  Inhale 5 L/min into the lungs at bedtime. Connect to BIPAP qhs   Yes [provider]  potassium chloride  SA (KLOR-CON  M) 20 MEQ tablet Take 2 tablets (40 mEq total) by mouth daily. 06/21/23  Yes Bryan Bianchi, MD  rOPINIRole  (REQUIP ) 0.5 MG tablet Take 1 tablet (0.5 mg total) by mouth at bedtime. 06/21/23  Yes Bryan Bianchi, MD  torsemide  (DEMADEX ) 20 MG tablet Take 60 mg by mouth 2 (two) times daily.   Yes [provider]  TRULICITY 1.5 MG/0.5ML SOAJ Inject 1.5 mg into the skin every Saturday. 10/31/23  Yes [provider]  loratadine  (CLARITIN ) 10 MG tablet Take 1 tablet (10 mg total) by mouth daily. Patient not taking: Reported on 11/04/2023 10/04/23   Christia Budds, MD     Critical care time: NA

## 2023-11-08 NOTE — Plan of Care (Signed)
 Informed by nursing that patient was taken off BiPAP by RT as she requested and transitioned to HFNC. Discussed with RN and RT that patient must be on BiPAP all night. Patient was placed back on BiPAP.

## 2023-11-08 NOTE — Plan of Care (Signed)
 FMTS Brief Progress Note  S: On interview, patient conversing comfortably on 12 L high flow nasal cannula, satting 98%.  No new complaints.  Has been off BiPAP since 5 PM, approximately 2-1/2 hours.  Denies chest pain.  Denies shortness of breath.  Continued A-flutter on telemetry to 102.  O: BP 111/87 (BP Location: Left Wrist)   Pulse (!) 53   Temp 98.6 F (37 C) (Oral)   Resp 19   Ht 5' 5 (1.651 m)   Wt (!) 149.7 kg   SpO2 100%   BMI 54.92 kg/m    Constitutional: NAD, sitting up in bed, alert, attentive to interview, on HFNC 12 L Cardiovascular: Telemetry showed A-flutter, tachy to 102 Respiratory: O2 sat 98%, no increased work of breathing Neuro: No gross deficits. Psych: Appropriate mood and affect.  A/P: Acute respiratory failure with hypoxia and hypercarbia - No changes.  Rest of plan per day team. - Orders reviewed. Labs for AM ordered, which were adjusted as needed.  - If condition changes, plan includes reassessment and plan adjustment as needed.  Rollene Katz, MD 11/08/2023, 8:07 PM PGY-1, [facility] Night Resident  Please page [phone] with questions.

## 2023-11-08 NOTE — Progress Notes (Signed)
 PHARMACY - ANTICOAGULATION CONSULT NOTE  Pharmacy Consult for Heparin  Indication: atrial flutter  Allergies  Allergen Reactions   Iodinated Contrast Media Anaphylaxis   Penicillins Anaphylaxis    Patient tolerates cefepime  (08/2017)   Insulin  Lispro Other (See Comments)    Adder per [facility] from [facility]   Minoxidil Hives    Patient Measurements: Height: 5' 5 (165.1 cm) Weight: (!) 149.7 kg (330 lb 0.5 oz) IBW/kg (Calculated) : 57 Heparin  Dosing Weight: 95 kg  Vital Signs: Temp: 98.1 F (36.7 C) (02/07 0402) Temp Source: Axillary (02/07 0402) BP: 97/67 (02/07 0402) Pulse Rate: 73 (02/07 0651)  Labs: Recent Labs    11/06/23 1602 11/06/23 2333 11/07/23 0451 11/07/23 1746 11/08/23 0450  HGB 11.1*  --  11.6*  --  11.2*  HCT 37.2  --  37.9  --  36.8  PLT 252  --  224  --  252  APTT  --  34  --  57* 69*  HEPARINUNFRC  --   --   --   --  >1.10*  CREATININE 0.78  --  0.70  --  1.01*    Estimated Creatinine Clearance: 89.1 mL/min (A) (by C-G formula based on SCr of 1.01 mg/dL (H)).   Medical History: Past Medical History:  Diagnosis Date   Acquired hypothyroidism 11/13/2010   Qualifier: Diagnosis of   By: Pietro, MD, CODY Redell Dimes      Acute congestive heart failure (HCC) 03/18/2021   Acute idiopathic gout of left hand 02/16/2019   Acute kidney injury (HCC) 05/29/2023   Acute metabolic encephalopathy 09/29/2023   Acute on chronic diastolic CHF (congestive heart failure), NYHA class 3 (HCC) 09/25/2017   Acute on chronic respiratory failure with hypoxia and hypercapnia (HCC) 04/07/2022   Arthritis    qwhere (12/05/2017)   Asthma    Atrial flutter (HCC) 03/09/2021   Bleeding of the respiratory tract 04/07/2022   Bronchitis 05/30/2011   Cervical cancer (HCC) 2006   CHF exacerbation (HCC) 07/30/2022   Chronic heart failure with preserved ejection fraction (HCC) 02/23/2018   Chronic lower back pain    Chronic respiratory failure with hypoxia (HCC) 07/02/2018    ABG 11/2017 7.36/69   Consistent with obesity hypoventilation syndrome   Complication of anesthesia    I have a hard time waking up from under it (12/05/2017)   COPD (chronic obstructive pulmonary disease) (HCC) 03/04/2015   Coronary artery disease    Cough productive of clear sputum 06/03/2023   Diarrhea 06/18/2019   DJD (degenerative joint disease) 07/03/2021   DM2 (diabetes mellitus, type 2) (HCC) 11/10/2010   Dyspnea 12/05/2017   Dyspnea on exertion 02/09/2021   Edema 03/22/2011   Essential (primary) hypertension 11/10/2010   Formatting of this note might be different from the original.  Overview:   Qualifier: Diagnosis of   By: Gwenn Grimes      Last Assessment & Plan:   Watch blood pressure off of minoxidil and add medications as needed.   Essential hypertension 11/10/2010   Qualifier: Diagnosis of   By: Gwenn Grimes       Gastro-esophageal reflux disease without esophagitis 11/10/2010   GERD 11/10/2010   Qualifier: Diagnosis of   By: Gwenn Grimes       Hair loss 09/17/2019   Heart failure (HCC) 04/20/2021   Heart murmur    Hepatitis C    History of cervical cancer 03/22/2011   History of gout X 1   History of tobacco use 04/20/2021   Hx  of atrial flutter 03/15/2021   Hyperglycemia due to type 2 diabetes mellitus (HCC) 04/20/2021   Hyperlipidemia    Hypertension associated with diabetes (HCC) 08/25/2018   Irritable bowel syndrome with diarrhea 06/18/2019   Limitation of activity due to disability 09/18/2021   Lymphedema of both lower extremities 03/22/2011   Metabolic alkalosis 03/28/2021   Microalbuminuria 04/20/2021   Migraine    monthly (12/05/2017)   Mild tricuspid regurgitation 04/02/2021   Mitral valve sclerosis 04/02/2021   Mixed hyperlipidemia 04/18/2020   Moderate aortic stenosis 04/20/2021   Mononeuropathy of lower extremity 04/09/2011   Formatting of this note might be different from the original.  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  Severe, corrected by CPAP 18 with C-flex of 3  2/13 bipap 20/14 sm ff mask h/h biflex+3cm  Formatting of this note might be different from the original.  Sleep study 06/2020: IMPRESSIONS  - Moderate obstructive sleep apnea occurred during this study (AHI = 17.7/h), mostly REM related  - No significant central sleep apnea occurred during   On home oxygen  therapy    2L all the time (12/05/2017)   On supplemental oxygen  by nasal cannula 04/20/2021   Onychomycosis 06/18/2019   OSA treated with BiPAP    have CPAP at home too; wearing BiPAP right now (12/05/2017)   Osteoarthritis 11/28/2010   Qualifier: Diagnosis of   By: Lelon RIGGERS, Scott       Other long term (current) drug therapy 02/12/2022   Paroxysmal atrial fibrillation (HCC) 09/04/2021   Physical debility 05/10/2018   Formatting of this note might be different from the original.  Due to morbid obesity   Physical deconditioning 04/24/2021   Pneumonia    several times (12/05/2017)   Pneumonia due to gram-positive bacteria 08/24/2017   Pressure injury of both heels, unstageable (HCC) 07/03/2021   Pulmonary edema, acute (HCC) 02/21/2022   Renal insufficiency 04/20/2021   Restless leg syndrome 03/22/2021   Restrictive lung disease secondary to obesity 01/28/2022   S/P hysterectomy 02/19/2021   Tinea pedis of both feet  11/12/2018   Unspecified atrial flutter (HCC) 02/12/2022   Upper GI bleed 07/18/2022   Venous insufficiency of both lower extremities 07/02/2021   Weakness 03/20/2021   Weight loss 03/15/2021    Medications:  Scheduled:   atorvastatin   20 mg Oral QPM   buPROPion  ER  100 mg Oral BID   busPIRone   7.5 mg Oral BID   Chlorhexidine  Gluconate Cloth  6 each Topical Daily   ferrous sulfate   325 mg Oral Daily   fluticasone  furoate-vilanterol  1 puff Inhalation Daily   And   umeclidinium bromide   1 puff Inhalation Daily   furosemide   80 mg Intravenous BID   insulin  aspart  0-15 Units Subcutaneous TID WC   levothyroxine   150 mcg Oral Q0600   loratadine   10 mg Oral  Daily   montelukast   10 mg Oral QHS   mouth rinse  15 mL Mouth Rinse 4 times per day   oseltamivir   75 mg Oral BID   pantoprazole   40 mg Oral Daily   sodium chloride  flush  10-40 mL Intracatheter Q12H    Assessment: 58 y.o. female with Aflutter, Eliquis  on hold, for heparin  pending decision regarding possible trach for hypercarbia.   2/7 AM: Aptt 69- therapeutic with heparin  running at 1900 units/hour. Heparin  level falsely elevated d/t recent apixaban  use. CBC remains stable. No signs of bleeding or issues with he heparin  infusion noted.   Goal of Therapy:  aPTT 66-102 sec Heparin  level 0.3-0.7 units/ml Monitor platelets by anticoagulation protocol: Yes   Plan:  Continue Heparin  1900 units/hr Check aPTT in 8 hours   Massie Fila, PharmD Clinical Pharmacist  11/08/2023 7:26 AM

## 2023-11-08 NOTE — Plan of Care (Signed)
  Problem: Education: Goal: Ability to describe self-care measures that may prevent or decrease complications (Diabetes Survival Skills Education) will improve Outcome: Progressing Goal: Individualized Educational Video(s) Outcome: Progressing   Problem: Coping: Goal: Ability to adjust to condition or change in health will improve Outcome: Progressing   Problem: Fluid Volume: Goal: Ability to maintain a balanced intake and output will improve Outcome: Progressing   Problem: Health Behavior/Discharge Planning: Goal: Ability to identify and utilize available resources and services will improve Outcome: Progressing Goal: Ability to manage health-related needs will improve Outcome: Progressing   Problem: Metabolic: Goal: Ability to maintain appropriate glucose levels will improve Outcome: Progressing   Problem: Nutritional: Goal: Maintenance of adequate nutrition will improve Outcome: Progressing Goal: Progress toward achieving an optimal weight will improve Outcome: Progressing   Problem: Skin Integrity: Goal: Risk for impaired skin integrity will decrease Outcome: Progressing   Problem: Education: Goal: Knowledge of General Education information will improve Description: Including pain rating scale, medication(s)/side effects and non-pharmacologic comfort measures Outcome: Progressing   Problem: Tissue Perfusion: Goal: Adequacy of tissue perfusion will improve Outcome: Progressing   Problem: Health Behavior/Discharge Planning: Goal: Ability to manage health-related needs will improve Outcome: Progressing   Problem: Clinical Measurements: Goal: Ability to maintain clinical measurements within normal limits will improve Outcome: Progressing Goal: Will remain free from infection Outcome: Progressing Goal: Diagnostic test results will improve Outcome: Progressing Goal: Respiratory complications will improve Outcome: Progressing Goal: Cardiovascular complication will  be avoided Outcome: Progressing   Problem: Activity: Goal: Risk for activity intolerance will decrease Outcome: Progressing   Problem: Nutrition: Goal: Adequate nutrition will be maintained Outcome: Progressing   Problem: Coping: Goal: Level of anxiety will decrease Outcome: Progressing   Problem: Elimination: Goal: Will not experience complications related to bowel motility Outcome: Progressing Goal: Will not experience complications related to urinary retention Outcome: Progressing   Problem: Pain Managment: Goal: General experience of comfort will improve and/or be controlled Outcome: Progressing   Problem: Safety: Goal: Ability to remain free from injury will improve Outcome: Progressing   Problem: Skin Integrity: Goal: Risk for impaired skin integrity will decrease Outcome: Progressing

## 2023-11-08 NOTE — Plan of Care (Signed)
 Called Pulm/CCM to ask them to come see the patient to discuss tracheostomy. Chelsea [name] agreed that someone from the team would be by to evaluate her.

## 2023-11-08 NOTE — Assessment & Plan Note (Addendum)
 Is still in flutter per monitors this morning with rates in the low 100s to 110s.  She denies palpitations this morning.  Will obtain EKG and discussed with cards giving a second dose of dig. - Cards: Diltiazem  and amiodarone  drip to help with rate control, 150 cc amio bolus x1  - Cards: IV Dig 0.25 x1  -Repeat EKG, consider second dose of digoxin  - Holding home amiodarone  200 mg daily

## 2023-11-08 NOTE — Assessment & Plan Note (Addendum)
 Patient is awake and alert on high flow nasal cannula this morning.  Oxygenating appropriately to 95%.  She denies any chest discomfort or wheezing.  She does have a cough most likely related to her influenza but does not want any symptomatic treatment at this time. - Continuous pulse ox - Continue BiPAP while sleeping/naps, wean as tolerated to O2goal of 88-92% - NPO while on BiPAP  -Patient tolerating HFNC while awake may have diet. -Patient has completed her course of CTX  - Continue holding oxycodone  and do not administer benzos, given respiratory status and altered mentation/somnolence throughout admission  - AM CBC, BMP  - Patient is willing to talk to pulm about a trach

## 2023-11-08 NOTE — Assessment & Plan Note (Addendum)
 Patient remains afebrile and without significant symptoms. - Tamiflu  (2/6 - ) will complete 5 day course  - AM CBC  - Bcx NG x12 hours

## 2023-11-08 NOTE — TOC Progression Note (Signed)
 Transition of Care [facility]) - Progression Note    Patient Details  Name: Chelsea [name] MRN: [id number] Date of Birth: [birth year]  Transition of [facility]) CM/SW Contact  Isaiah Public, LCSWA Phone Number: 11/08/2023, 11:24 AM  Clinical Narrative:     CSW following. Plan for patient to return to [facility] when medically ready.  Expected Discharge Plan: Skilled Nursing Facility Barriers to Discharge: Continued Medical Work up  Expected Discharge Plan and Services In-house Referral: Clinical Social Work     Living arrangements for the past 2 months: Skilled Nursing Facility                                       Social Determinants of Health (SDOH) Interventions SDOH Screenings   Food Insecurity: No Food Insecurity (11/04/2023)  Housing: Low Risk  (11/04/2023)  Transportation Needs: No Transportation Needs (11/04/2023)  Utilities: Not At Risk (11/04/2023)  Alcohol Screen: Low Risk  (07/05/2022)  Financial Resource Strain: Low Risk  (07/05/2022)  Physical Activity: Insufficiently Active (03/27/2021)   Received from [facility], [facility]  Social Connections: Unknown (02/04/2022)   Received from [facility], [facility]  Stress: No Stress Concern Present (09/15/2021)   Received from [facility], [facility]  Tobacco Use: Low Risk  (11/03/2023)    Readmission Risk Interventions    07/23/2022    2:19 PM 04/09/2022    4:17 PM  Readmission Risk Prevention Plan  Transportation Screening Complete Complete  PCP or Specialist Appt within 3-5 Days  Complete  HRI or Home Care Consult  Complete  Social Work Consult for Recovery Care Planning/Counseling  Complete  Palliative Care Screening  Not Applicable  Medication Review Oceanographer) Complete Referral to Pharmacy  PCP or Specialist appointment within 3-5 days of discharge Complete   HRI or Home Care Consult Complete   SW Recovery Care/Counseling Consult Complete   Palliative Care Screening Not Applicable    Skilled Nursing Facility Complete

## 2023-11-08 NOTE — Plan of Care (Signed)
 Spoke with ENT on-call physician, Dr. Roark regarding pulmonology request for tracheostomy placement in the OR given prior history of tracheostomy site and body habitus.  Dr. Roark confirmed they will see the patient for evaluation.  Chelsea Pagan, MD 11/08/2023, 2:27 PM PGY-2, [facility] Family Medicine Service pager [phone]

## 2023-11-08 NOTE — Progress Notes (Signed)
 Patient desats on HFNC to 25s. Placed pt back on BiPAP with o2 sats back on 96%. Pt denies shortness of breath or any symptoms.  MDs made aware.  Cori Justus, RN

## 2023-11-08 NOTE — Progress Notes (Signed)
   11/08/23 0351  BiPAP/CPAP/SIPAP  BiPAP/CPAP/SIPAP Pt Type Adult  BiPAP/CPAP/SIPAP SERVO  Mask Type Full face mask  Mask Size Medium  Set Rate 15 breaths/min  Respiratory Rate 24 breaths/min  IPAP 22 cmH20  EPAP 8 cmH2O  FiO2 (%) 60 %  Minute Ventilation 12.4  Leak 30  Peak Inspiratory Pressure (PIP) 22  Tidal Volume (Vt) 543  Patient Home Equipment No  Auto Titrate No  Press High Alarm 30 cmH2O  Press Low Alarm 5 cmH2O  BiPAP/CPAP /SiPAP Vitals  SpO2 96 %  Bilateral Breath Sounds Diminished

## 2023-11-08 NOTE — Progress Notes (Signed)
 Daily Progress Note Intern Pager: [phone]  Patient name: Chelsea [name] Medical record number: [id number] Date of birth: 24-Apr-1966 Age: 58 y.o. Gender: female  Primary Care Provider: Pcp, No Consultants: Cardiology, pulmonology Code Status: Full  Pt Overview and Major Events to Date:  2/2: Admitted 2/5: Patient with atrial flutter, cardiology consulted, started on diltiazem  2/6: Maxed out on diltiazem  and amio, added IV dig per cards  Assessment and Plan:  Patient is a 58 year old female presenting with acute hypoxic respiratory failure secondary to pneumonia versus heart failure exacerbation.  She has a longstanding history of hypercarbic respiratory failure due to poor respiratory drive. Assessment & Plan Acute respiratory failure with hypoxia and hypercarbia (HCC) Patient is awake and alert on high flow nasal cannula this morning.  Oxygenating appropriately to 95%.  She denies any chest discomfort or wheezing.  She does have a cough most likely related to her influenza but does not want any symptomatic treatment at this time. - Continuous pulse ox - Continue BiPAP while sleeping/naps, wean as tolerated to O2goal of 88-92% - NPO while on BiPAP  -Patient tolerating HFNC while awake may have diet. -Patient has completed her course of CTX  - Continue holding oxycodone  and do not administer benzos, given respiratory status and altered mentation/somnolence throughout admission  - AM CBC, BMP  - Patient is willing to talk to pulm about a trach Atrial flutter (HCC) Is still in flutter per monitors this morning with rates in the low 100s to 110s.  She denies palpitations this morning.  Will obtain EKG and discussed with cards giving a second dose of dig. - Cards: Diltiazem  and amiodarone  drip to help with rate control, 150 cc amio bolus x1  - Cards: IV Dig 0.25 x1  -Repeat EKG, consider second dose of digoxin  - Holding home amiodarone  200 mg daily   (HFpEF) heart failure  with preserved ejection fraction [facility]) Patient with an additional 1600 mL of output yesterday.  Continues to be difficult to assess volume status given body habitus.  Lungs are without significant signs of pulmonary edema. - Will dose Lasix  80 mg IV BID, and at metolazone  2.5 - PM BMP and mag - Continue Strict I/Os - Daily weights - Monitor BMP and Mag daily with diuresis  - Replete lytes as needed  - Hold  jardiance  25 mg  H1N1 influenza Patient remains afebrile and without significant symptoms. - Tamiflu  (2/6 - ) will complete 5 day course  - AM CBC  - Bcx NG x12 hours   Chronic and Stable Problems:  COPD: Continue home inhalers T2DM: Restarted her diet, will adjust insulin  needs as appropriate Anemia: Continue iron  supplementation HLD: Continue home atorvastatin  Hypothyroidism: Continue home levothyroxine  RLS: restart home ropinerole today GERD: Continue pantoprazole  Chronic pain: Consider restarting if mentation improves  FEN/GI: Carb modified PPx: Heparin  Dispo: Return to ALF  pending clinical improvement .    Subjective:  Was awake sitting up in bed this morning.  She states that she is feeling okay and would like something for her restless leg.  Given her movement and mentation this could be appropriate.  Objective: Temp:  [98.1 F (36.7 C)-98.9 F (37.2 C)] 98.1 F (36.7 C) (02/07 0402) Pulse Rate:  [42-142] 73 (02/07 0651) Resp:  [17-26] 21 (02/07 0651) BP: (97-129)/(67-103) 97/67 (02/07 0402) SpO2:  [93 %-100 %] 94 % (02/07 0651) FiO2 (%):  [60 %] 60 % (02/07 0351) Weight:  [149.7 kg] 149.7 kg (02/07 0402) Physical Exam: General:  A&O, no acute distress Cardiovascular: Irregularly irregular, tachycardic.  No M/R/G Respiratory: Mild bilateral end expiratory wheezing, comfortable on high flow nasal cannula at 15 L Abdomen: Flat, soft, nontender Extremities: No obvious pitting edema, moves all 4 spontaneously and appropriately.  Laboratory: Most recent  CBC Lab Results  Component Value Date   WBC 7.1 11/08/2023   HGB 11.2 (L) 11/08/2023   HCT 36.8 11/08/2023   MCV 87.6 11/08/2023   PLT 252 11/08/2023   Most recent BMP    Latest Ref Rng & Units 11/08/2023    4:50 AM  BMP  Glucose 70 - 99 mg/dL 839   BUN 6 - 20 mg/dL 18   Creatinine 9.55 - 1.00 mg/dL 8.98   Sodium 864 - 854 mmol/L 138   Potassium 3.5 - 5.1 mmol/L 3.6   Chloride 98 - 111 mmol/L 92   CO2 22 - 32 mmol/L 36   Calcium  8.9 - 10.3 mg/dL 8.1     Cleotilde Lukes, DO 11/08/2023, 7:37 AM  PGY-1, [place] [facility] FPTS Intern pager: [phone], text pages welcome Secure chat group [facility] Teaching Service

## 2023-11-08 NOTE — Progress Notes (Signed)
 Pt requested to be taken off of BIPAP and placed on [place] for the rest of the night. Pt is comfortable and vitals are stable when placed on 6L [place].

## 2023-11-08 NOTE — Plan of Care (Signed)
  Problem: Clinical Measurements: Goal: Respiratory complications will improve Outcome: Progressing Goal: Cardiovascular complication will be avoided Outcome: Progressing   Problem: Nutrition: Goal: Adequate nutrition will be maintained Outcome: Progressing   Problem: Pain Managment: Goal: General experience of comfort will improve and/or be controlled Outcome: Progressing   Problem: Safety: Goal: Ability to remain free from injury will improve Outcome: Progressing

## 2023-11-08 NOTE — Progress Notes (Signed)
 Orthopedic Tech Progress Note Patient Details:  Chelsea [name] [birth year] [id number]  Ortho Devices Type of Ortho Device: Radio broadcast assistant Ortho Device/Splint Location: Bilateral Ortho Device/Splint Interventions: Ordered, Application   Post Interventions Patient Tolerated: Well  Chelsea [name] 11/08/2023, 2:36 PM

## 2023-11-08 NOTE — Assessment & Plan Note (Addendum)
 Patient with an additional 1600 mL of output yesterday.  Continues to be difficult to assess volume status given body habitus.  Lungs are without significant signs of pulmonary edema. - Will dose Lasix  80 mg IV BID, and at metolazone  2.5 - PM BMP and mag - Continue Strict I/Os - Daily weights - Monitor BMP and Mag daily with diuresis  - Replete lytes as needed  - Hold  jardiance  25 mg

## 2023-11-09 DIAGNOSIS — I5033 Acute on chronic diastolic (congestive) heart failure: Secondary | ICD-10-CM | POA: Diagnosis not present

## 2023-11-09 DIAGNOSIS — J111 Influenza due to unidentified influenza virus with other respiratory manifestations: Secondary | ICD-10-CM | POA: Diagnosis not present

## 2023-11-09 DIAGNOSIS — I4891 Unspecified atrial fibrillation: Secondary | ICD-10-CM | POA: Diagnosis not present

## 2023-11-09 DIAGNOSIS — J101 Influenza due to other identified influenza virus with other respiratory manifestations: Secondary | ICD-10-CM | POA: Diagnosis not present

## 2023-11-09 DIAGNOSIS — J189 Pneumonia, unspecified organism: Secondary | ICD-10-CM | POA: Diagnosis not present

## 2023-11-09 LAB — BASIC METABOLIC PANEL
Anion gap: 16 — ABNORMAL HIGH (ref 5–15)
BUN: 14 mg/dL (ref 6–20)
CO2: 36 mmol/L — ABNORMAL HIGH (ref 22–32)
Calcium: 8.9 mg/dL (ref 8.9–10.3)
Chloride: 86 mmol/L — ABNORMAL LOW (ref 98–111)
Creatinine, Ser: 0.83 mg/dL (ref 0.44–1.00)
GFR, Estimated: >60 mL/min (ref >60)
Glucose, Bld: 169 mg/dL — ABNORMAL HIGH (ref 70–99)
Potassium: 3.5 mmol/L (ref 3.5–5.1)
Sodium: 138 mmol/L (ref 135–145)

## 2023-11-09 LAB — GLUCOSE, CAPILLARY
Glucose-Capillary: 159 mg/dL — ABNORMAL HIGH (ref 70–99)
Glucose-Capillary: 164 mg/dL — ABNORMAL HIGH (ref 70–99)
Glucose-Capillary: 208 mg/dL — ABNORMAL HIGH (ref 70–99)
Glucose-Capillary: 201 mg/dL — ABNORMAL HIGH (ref 70–99)

## 2023-11-09 LAB — CBC
HCT: 35.9 % — ABNORMAL LOW (ref 36.0–46.0)
Hemoglobin: 10.7 g/dL — ABNORMAL LOW (ref 12.0–15.0)
MCH: 26.2 pg (ref 26.0–34.0)
MCHC: 29.8 g/dL — ABNORMAL LOW (ref 30.0–36.0)
MCV: 88 fL (ref 80.0–100.0)
Platelets: 252 10*3/uL (ref 150–400)
RBC: 4.08 MIL/uL (ref 3.87–5.11)
RDW: 15.5 % (ref 11.5–15.5)
WBC: 6.1 10*3/uL (ref 4.0–10.5)
nRBC: 0 % (ref 0.0–0.2)

## 2023-11-09 LAB — APTT
aPTT: 100 s — ABNORMAL HIGH (ref 24–36)
aPTT: 47 s — ABNORMAL HIGH (ref 24–36)

## 2023-11-09 LAB — MAGNESIUM: Magnesium: 1.8 mg/dL (ref 1.7–2.4)

## 2023-11-09 LAB — HEPARIN LEVEL (UNFRACTIONATED)
Heparin Unfractionated: 0.75 [IU]/mL — ABNORMAL HIGH (ref 0.30–0.70)
Heparin Unfractionated: 0.29 [IU]/mL — ABNORMAL LOW (ref 0.30–0.70)

## 2023-11-09 MED ORDER — DILTIAZEM HCL-DEXTROSE 125-5 MG/125ML-% IV SOLN (PREMIX)
5.0000 mg/h | INTRAVENOUS | Status: DC
  Administered 2023-11-09: 5 mg/h via INTRAVENOUS
  Administered 2023-11-10 – 2023-11-11 (×6): 15 mg/h via INTRAVENOUS
  Filled 2023-11-09 (×13): qty 125

## 2023-11-09 NOTE — Plan of Care (Signed)
  Problem: Nutritional: Goal: Maintenance of adequate nutrition will improve Outcome: Progressing   Problem: Clinical Measurements: Goal: Ability to maintain clinical measurements within normal limits will improve Outcome: Progressing Goal: Respiratory complications will improve Outcome: Progressing Goal: Cardiovascular complication will be avoided Outcome: Progressing   Problem: Nutrition: Goal: Adequate nutrition will be maintained Outcome: Progressing   Problem: Pain Managment: Goal: General experience of comfort will improve and/or be controlled Outcome: Progressing   Problem: Safety: Goal: Ability to remain free from injury will improve Outcome: Progressing

## 2023-11-09 NOTE — Progress Notes (Signed)
 Progress Note  Patient Name: Chelsea [name] Date of Encounter: 11/09/2023  Primary Cardiologist: Oneil Parchment, MD  Subjective   No acute events overnight.  On amiodarone  and diltiazem  drip.  Heart rates controlled.  Continues to be in atrial fibrillation.  Inpatient Medications    Scheduled Meds:  atorvastatin   20 mg Oral QPM   buPROPion  ER  100 mg Oral BID   busPIRone   7.5 mg Oral BID   Chlorhexidine  Gluconate Cloth  6 each Topical Daily   ferrous sulfate   325 mg Oral Daily   fluticasone  furoate-vilanterol  1 puff Inhalation Daily   And   umeclidinium bromide   1 puff Inhalation Daily   furosemide   80 mg Intravenous BID   insulin  aspart  0-15 Units Subcutaneous TID WC   levothyroxine   150 mcg Oral Q0600   loratadine   10 mg Oral Daily   metolazone   2.5 mg Oral BID   montelukast   10 mg Oral QHS   mouth rinse  15 mL Mouth Rinse 4 times per day   oseltamivir   75 mg Oral BID   pantoprazole   40 mg Oral Daily   rOPINIRole   0.5 mg Oral QHS   sodium chloride  flush  10-40 mL Intracatheter Q12H   Continuous Infusions:  amiodarone  30 mg/hr (11/09/23 0208)   heparin  2,400 Units/hr (11/09/23 0703)   PRN Meds: acetaminophen , albuterol , diclofenac  Sodium, guaiFENesin -dextromethorphan , melatonin, mouth rinse, sodium chloride  flush, sodium chloride  flush   Vital Signs    Vitals:   11/08/23 2333 11/09/23 0200 11/09/23 0603 11/09/23 0900  BP: 97/66  103/75   Pulse: (!) 54 74    Resp: 20 14 18  (!) 22  Temp: 98.8 F (37.1 C)  98 F (36.7 C) 97.9 F (36.6 C)  TempSrc: Axillary  Axillary Oral  SpO2: 96% 98% 99%   Weight:   (!) 148.2 kg   Height:        Intake/Output Summary (Last 24 hours) at 11/09/2023 0949 Last data filed at 11/09/2023 0600 Gross per 24 hour  Intake 1555 ml  Output 3700 ml  Net -2145 ml   Filed Weights   11/07/23 0443 11/08/23 0402 11/09/23 0603  Weight: (!) 148.2 kg (!) 149.7 kg (!) 148.2 kg    Telemetry     Personally reviewed, atrial  fibrillation, rate controlled. HR80s  ECG    Not performed today  Physical Exam   GEN: No acute distress.   Neck: Unable to examine due to body habitus. Cardiac: RRR, no murmur, rub, or gallop.  Respiratory: Nonlabored. Clear to auscultation bilaterally. GI: Soft, nontender, bowel sounds present. MS: 2+ pitting edema; No deformity. Neuro:  Nonfocal. Psych: Alert and oriented x 3. Normal affect.  Labs    Chemistry Recent Labs  Lab 11/05/23 [id number] 11/06/23 0545 11/08/23 0450 11/08/23 1600 11/09/23 0530  NA 137   < > 138 137 138  K 3.8   < > 3.6 3.8 3.5  CL 91*   < > 92* 92* 86*  CO2 33*   < > 36* 35* 36*  GLUCOSE 162*   < > 160* 242* 169*  BUN 38*   < > 18 16 14   CREATININE 0.94   < > 1.01* 0.82 0.83  CALCIUM  8.9   < > 8.1* 8.3* 8.9  PROT 7.2  --   --   --   --   ALBUMIN  2.8*  --   --   --   --   AST 13*  --   --   --   --  ALT 12  --   --   --   --   ALKPHOS 83  --   --   --   --   BILITOT 0.4  --   --   --   --   GFRNONAA >60   < > >60 >60 >60  ANIONGAP 13   < > 10 10 16*   < > = values in this interval not displayed.     Hematology Recent Labs  Lab 11/07/23 0451 11/08/23 0450 11/09/23 0530  WBC 6.2 7.1 6.1  RBC 4.32 4.20 4.08  HGB 11.6* 11.2* 10.7*  HCT 37.9 36.8 35.9*  MCV 87.7 87.6 88.0  MCH 26.9 26.7 26.2  MCHC 30.6 30.4 29.8*  RDW 15.6* 15.5 15.5  PLT 224 252 252    Cardiac Enzymes Recent Labs  Lab 11/04/23 2046  TROPONINIHS 11    BNP Recent Labs  Lab 11/03/23 1951  BNP 411.3*     DDimerNo results for input(s): DDIMER in the last 168 hours.   Radiology    No results found.    Assessment & Plan     Acute on chronic diastolic heart failure A-fib with RVR, currently rate controlled (In the setting of flu infection) Influenza infection   -Currently on diltiazem , amiodarone  drips.  Rate controlled.  Rhythm continues to be atrial fibrillation.  Wean off diltiazem  drip.  Continue amiodarone  drip.  Will switch to p.o.  amiodarone  starting tomorrow.  Continue systemic AC with heparin  drip, can switch to DOAC if patient able to tolerate p.o. -3.7L urine output in the last 24 hours on IV Lasix  80 mg twice daily and metolazone .  Continue current diuretics, IV Lasix  80 mg twice daily and metolazone  2.5 mg twice daily. -Influenza infection management per primary team.  Signed, Diannah SHAUNNA Maywood, MD  11/09/2023, 9:49 AM

## 2023-11-09 NOTE — Assessment & Plan Note (Addendum)
 Patient appears unchanged today. Tolerated HFNC for prolonged periods yesterday evening. Denies chest pain. No new concerns. ENT to discuss trach placement later in morning.  Patient amenable. - Await ENT conversation with eye toward trach early next week.  - Concerning BiPAP:  - Oxygen  saturation goal of 88-92%  - Continue BiPAP while sleeping. - When awake, can transition to HFNC while eating meals.  - NPO while on BiPAP. - Do not administer benzodiazepines or opioids in this patient with poor respiratory drive.  - AM CBC/BMP

## 2023-11-09 NOTE — Progress Notes (Signed)
 Daily Progress Note Intern Pager: [phone]  Patient name: Chelsea [name] Medical record number: [id number] Date of birth: 10/26/1965 Age: 58 y.o. Gender: female  Primary Care Provider: Pcp, No Consultants: Cardiology, pulmonology Code Status: Full  Pt Overview and Major Events to Date:  2/2: Admitted 2/5: Patient with atrial flutter, cardiology consulted, started on diltiazem  2/6: Maxed out on diltiazem  and amio, added IV dig per cards 2/7: Bolus of amio 150 cc x1, continue dig.   Assessment and Plan:  Patient is a 58 year old female presenting with acute hypoxic respiratory failure secondary to pneumonia versus heart failure exacerbation.  She has a longstanding history of hypercarbic respiratory failure due to poor respiratory drive. Assessment & Plan Acute respiratory failure with hypoxia and hypercarbia (HCC) Patient appears unchanged today. Tolerated HFNC for prolonged periods yesterday evening. Denies chest pain. No new concerns. ENT to discuss trach placement later in morning.  Patient amenable. - Await ENT conversation with eye toward trach early next week.  - Concerning BiPAP:  - Oxygen  saturation goal of 88-92%  - Continue BiPAP while sleeping. - When awake, can transition to HFNC while eating meals.  - NPO while on BiPAP. - Do not administer benzodiazepines or opioids in this patient with poor respiratory drive.  - AM CBC/BMP Atrial flutter (HCC) Remains in atrial flutter, HR: 70-80 this morning down from 100-120. Cardiology recommended 2/8 switching from heparin  IV to Eliquis .  - If trach procedure is deferred/declined, can consider stopping IV heparin  and start Eliquis  5 mg BID  - Continue IV amiodarone  and diltiazem  +/- additional IV digoxin  depending on cards recs (HFpEF) heart failure with preserved ejection fraction (HCC) Net output -1.945 L on IV lasix  80 mg BID and metolazone  2.5 mg BID. Pressures remain WNL, tolerating diuresis well. Mag 2.3 WNL. K 3.8.  -  Continue IV Lasix  80 mg BID and metolazone  2.5 mg BID.  - Continue to monitor BMP/magnesium .  - Continue I/Os and daily weights.  H1N1 influenza Remains afebrile. No new changes. - Continue Tamiflu  (2/6-2/11)  - Bcx NGTD over 24h  Chronic and Stable Problems:  COPD: Continue home inhalers T2DM: Restarted her diet, will adjust insulin  needs as appropriate Anemia: Continue iron  supplementation HLD: Continue home atorvastatin  Hypothyroidism: Continue home levothyroxine  RLS: Continue home ropinerole  GERD: Continue pantoprazole  Chronic pain: Consider restarting if mentation improves  FEN/GI: Carb modified PPx: Heparin  Dispo: Return to ALF  pending clinical improvement .    Subjective:  On interview, patient was awakened on BiPAP.  Patient noted that wrap on right leg was a little tight, otherwise no new concerns.  Denied chest pain or shortness of breath.  Amenable to ENT interview later today concerning trach.  Objective: Temp:  [98 F (36.7 C)-98.8 F (37.1 C)] 98.8 F (37.1 C) (02/07 2333) Pulse Rate:  [42-105] 54 (02/07 2333) Resp:  [18-24] 20 (02/07 2333) BP: (97-129)/(66-103) 97/66 (02/07 2333) SpO2:  [90 %-100 %] 96 % (02/07 2333) FiO2 (%):  [60 %] 60 % (02/07 2312) Weight:  [149.7 kg] 149.7 kg (02/07 0402) Physical Exam: General: A&O, no acute distress, sleepy Cardiovascular: Per tele: Irregularly irregular, heart rate 70-80 Respiratory: Respiratory rate WNL, appeared comfortable on BiPAP, no increased work of breathing Abdomen: Nontender Extremities: Moves all 4 spontaneously and appropriately.  Laboratory: Most recent CBC Lab Results  Component Value Date   WBC 7.1 11/08/2023   HGB 11.2 (L) 11/08/2023   HCT 36.8 11/08/2023   MCV 87.6 11/08/2023   PLT 252 11/08/2023   Most  recent BMP    Latest Ref Rng & Units 11/08/2023    4:00 PM  BMP  Glucose 70 - 99 mg/dL 757   BUN 6 - 20 mg/dL 16   Creatinine 9.55 - 1.00 mg/dL 9.17   Sodium 864 - 854 mmol/L 137    Potassium 3.5 - 5.1 mmol/L 3.8   Chloride 98 - 111 mmol/L 92   CO2 22 - 32 mmol/L 35   Calcium  8.9 - 10.3 mg/dL 8.3     Rollene Katz, MD 11/09/2023, 1:25 AM  PGY-1, [facility] FPTS Intern pager: [phone], text pages welcome Secure chat group [facility] Teaching Service

## 2023-11-09 NOTE — Progress Notes (Signed)
 PHARMACY - ANTICOAGULATION CONSULT NOTE  Pharmacy Consult for Heparin  Indication: atrial flutter  Allergies  Allergen Reactions   Iodinated Contrast Media Anaphylaxis   Penicillins Anaphylaxis    Patient tolerates cefepime  (08/2017)   Insulin  Lispro Other (See Comments)    Adder per [facility] from SNF   Minoxidil Hives    Patient Measurements: Height: 5' 5 (165.1 cm) Weight: (!) 149.7 kg (330 lb 0.5 oz) IBW/kg (Calculated) : 57 Heparin  Dosing Weight: 95 kg  Vital Signs: Temp: 98.8 F (37.1 C) (02/07 2333) Temp Source: Axillary (02/07 2333) BP: 97/66 (02/07 2333) Pulse Rate: 54 (02/07 2333)  Labs: Recent Labs    11/06/23 1602 11/06/23 2333 11/07/23 0451 11/07/23 1746 11/08/23 0450 11/08/23 1300 11/08/23 1600 11/08/23 2300  HGB 11.1*  --  11.6*  --  11.2*  --   --   --   HCT 37.2  --  37.9  --  36.8  --   --   --   PLT 252  --  224  --  252  --   --   --   APTT  --    < >  --    < > 69* 39*  --  58*  HEPARINUNFRC  --   --   --   --  >1.10*  --   --   --   CREATININE 0.78  --  0.70  --  1.01*  --  0.82  --    < > = values in this interval not displayed.    Estimated Creatinine Clearance: 109.7 mL/min (by C-G formula based on SCr of 0.82 mg/dL).   Medical History: Past Medical History:  Diagnosis Date   Acquired hypothyroidism 11/13/2010   Qualifier: Diagnosis of   By: Pietro, MD, CODY Redell Dimes      Acute congestive heart failure (HCC) 03/18/2021   Acute idiopathic gout of left hand 02/16/2019   Acute kidney injury (HCC) 05/29/2023   Acute metabolic encephalopathy 09/29/2023   Acute on chronic diastolic CHF (congestive heart failure), NYHA class 3 (HCC) 09/25/2017   Acute on chronic respiratory failure with hypoxia and hypercapnia (HCC) 04/07/2022   Arthritis    qwhere (12/05/2017)   Asthma    Atrial flutter (HCC) 03/09/2021   Bleeding of the respiratory tract 04/07/2022   Bronchitis 05/30/2011   Cervical cancer (HCC) 2006   CHF exacerbation (HCC)  07/30/2022   Chronic heart failure with preserved ejection fraction (HCC) 02/23/2018   Chronic lower back pain    Chronic respiratory failure with hypoxia (HCC) 07/02/2018   ABG 11/2017 7.36/69   Consistent with obesity hypoventilation syndrome   Complication of anesthesia    I have a hard time waking up from under it (12/05/2017)   COPD (chronic obstructive pulmonary disease) (HCC) 03/04/2015   Coronary artery disease    Cough productive of clear sputum 06/03/2023   Diarrhea 06/18/2019   DJD (degenerative joint disease) 07/03/2021   DM2 (diabetes mellitus, type 2) (HCC) 11/10/2010   Dyspnea 12/05/2017   Dyspnea on exertion 02/09/2021   Edema 03/22/2011   Essential (primary) hypertension 11/10/2010   Formatting of this note might be different from the original.  Overview:   Qualifier: Diagnosis of   By: Gwenn Grimes      Last Assessment & Plan:   Watch blood pressure off of minoxidil and add medications as needed.   Essential hypertension 11/10/2010   Qualifier: Diagnosis of   By: [name], Kimalexis  Gastro-esophageal reflux disease without esophagitis 11/10/2010   GERD 11/10/2010   Qualifier: Diagnosis of   By: Gwenn Grimes       Hair loss 09/17/2019   Heart failure (HCC) 04/20/2021   Heart murmur    Hepatitis C    History of cervical cancer 03/22/2011   History of gout X 1   History of tobacco use 04/20/2021   Hx of atrial flutter 03/15/2021   Hyperglycemia due to type 2 diabetes mellitus (HCC) 04/20/2021   Hyperlipidemia    Hypertension associated with diabetes (HCC) 08/25/2018   Irritable bowel syndrome with diarrhea 06/18/2019   Limitation of activity due to disability 09/18/2021   Lymphedema of both lower extremities 03/22/2011   Metabolic alkalosis 03/28/2021   Microalbuminuria 04/20/2021   Migraine    monthly (12/05/2017)   Mild tricuspid regurgitation 04/02/2021   Mitral valve sclerosis 04/02/2021   Mixed hyperlipidemia 04/18/2020   Moderate aortic  stenosis 04/20/2021   Mononeuropathy of lower extremity 04/09/2011   Formatting of this note might be different from the original.  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  Severe, corrected by CPAP 18 with C-flex of 3  2/13 bipap 20/14 sm ff mask h/h biflex+3cm  Formatting of this note might be different from the original.  Sleep study 06/2020: IMPRESSIONS  - Moderate obstructive sleep apnea occurred during this study (AHI = 17.7/h), mostly REM related  - No significant central sleep apnea occurred during   On home oxygen  therapy    2L all the time (12/05/2017)   On supplemental oxygen  by nasal cannula 04/20/2021   Onychomycosis 06/18/2019   OSA treated with BiPAP    have CPAP at home too; wearing BiPAP right now (12/05/2017)   Osteoarthritis 11/28/2010   Qualifier: Diagnosis of   By: Lelon RIGGERS, Scott       Other long term (current) drug therapy 02/12/2022   Paroxysmal atrial fibrillation (HCC) 09/04/2021   Physical debility 05/10/2018   Formatting of this note might be different from the original.  Due to morbid obesity   Physical deconditioning 04/24/2021   Pneumonia    several times (12/05/2017)   Pneumonia due to gram-positive bacteria 08/24/2017   Pressure injury of both heels, unstageable (HCC) 07/03/2021   Pulmonary edema, acute (HCC) 02/21/2022   Renal  insufficiency 04/20/2021   Restless leg syndrome 03/22/2021   Restrictive lung disease secondary to obesity 01/28/2022   S/P hysterectomy 02/19/2021   Tinea pedis of both feet 11/12/2018   Unspecified atrial flutter (HCC) 02/12/2022   Upper GI bleed 07/18/2022   Venous insufficiency of both lower extremities 07/02/2021   Weakness 03/20/2021   Weight loss 03/15/2021    Medications:  Scheduled:   atorvastatin   20 mg Oral QPM   buPROPion  ER  100 mg Oral BID   busPIRone   7.5 mg Oral BID   Chlorhexidine  Gluconate  Cloth  6 each Topical Daily   ferrous sulfate   325 mg Oral Daily   fluticasone  furoate-vilanterol  1 puff Inhalation Daily   And   umeclidinium bromide   1 puff Inhalation Daily   furosemide   80 mg Intravenous BID   insulin  aspart  0-15 Units Subcutaneous TID WC   levothyroxine   150 mcg Oral Q0600   loratadine   10 mg Oral Daily   metolazone   2.5 mg Oral BID   montelukast   10 mg Oral QHS   mouth rinse  15 mL Mouth Rinse 4 times per day   oseltamivir   75 mg Oral BID   pantoprazole   40 mg Oral Daily   rOPINIRole   0.5 mg Oral QHS   sodium chloride  flush  10-40 mL Intracatheter Q12H    Assessment: 58 y.o. female with Aflutter, Eliquis  on hold, for heparin  pending decision regarding possible trach for hypercarbia.   2/7 AM: Aptt 69- therapeutic with heparin  running at 1900 units/hour. Heparin  level falsely elevated d/t recent apixaban  use. CBC remains stable. No signs of bleeding or issues with he heparin  infusion noted.   2/8 AM update:  Heparin  level sub-therapeutic  Goal of Therapy:  aPTT 66-102 sec Heparin  level 0.3-0.7 units/ml Monitor platelets by anticoagulation protocol: Yes   Plan:  Inc heparin  to 2400 units/hr Heparin  level and aPTT in 8 hours  Lynwood Mckusick, PharmD, BCPS Clinical Pharmacist Phone: [phone]

## 2023-11-09 NOTE — Progress Notes (Signed)
 PHARMACY - ANTICOAGULATION CONSULT NOTE  Pharmacy Consult for Heparin  Indication: atrial flutter  Allergies  Allergen Reactions   Iodinated Contrast Media Anaphylaxis   Penicillins Anaphylaxis    Patient tolerates cefepime  (08/2017)   Insulin  Lispro Other (See Comments)    Adder per [facility] from SNF   Minoxidil Hives    Patient Measurements: Height: 5' 5 (165.1 cm) Weight: (!) 148.2 kg (326 lb 11.6 oz) IBW/kg (Calculated) : 57 Heparin  Dosing Weight: 95 kg  Vital Signs: Temp: 97.9 F (36.6 C) (02/08 0900) Temp Source: Oral (02/08 0900) BP: 103/75 (02/08 0603) Pulse Rate: 74 (02/08 0200)  Labs: Recent Labs    11/07/23 0451 11/07/23 1746 11/08/23 0450 11/08/23 1300 11/08/23 1600 11/08/23 2300 11/09/23 0530 11/09/23 0908  HGB 11.6*  --  11.2*  --   --   --  10.7*  --   HCT 37.9  --  36.8  --   --   --  35.9*  --   PLT 224  --  252  --   --   --  252  --   APTT  --    < > 69* 39*  --  58*  --  100*  HEPARINUNFRC  --   --  >1.10*  --   --   --   --  0.75*  CREATININE 0.70  --  1.01*  --  0.82  --  0.83  --    < > = values in this interval not displayed.    Estimated Creatinine Clearance: 107.7 mL/min (by C-G formula based on SCr of 0.83 mg/dL).   Medical History: Past Medical History:  Diagnosis Date   Acquired hypothyroidism 11/13/2010   Qualifier: Diagnosis of   By: Pietro, MD, CODY Redell Dimes      Acute congestive heart failure (HCC) 03/18/2021   Acute idiopathic gout of left hand 02/16/2019   Acute kidney injury (HCC) 05/29/2023   Acute metabolic encephalopathy 09/29/2023   Acute on chronic diastolic CHF (congestive heart failure), NYHA class 3 (HCC) 09/25/2017   Acute on chronic respiratory failure with hypoxia and hypercapnia (HCC) 04/07/2022   Arthritis    qwhere (12/05/2017)   Asthma    Atrial flutter (HCC) 03/09/2021   Bleeding of the respiratory tract 04/07/2022   Bronchitis 05/30/2011   Cervical cancer (HCC) 2006   CHF exacerbation (HCC)  07/30/2022   Chronic heart failure with preserved ejection fraction (HCC) 02/23/2018   Chronic lower back pain    Chronic respiratory failure with hypoxia (HCC) 07/02/2018   ABG 11/2017 7.36/69   Consistent with obesity hypoventilation syndrome   Complication of anesthesia    I have a hard time waking up from under it (12/05/2017)   COPD (chronic obstructive pulmonary disease) (HCC) 03/04/2015   Coronary artery disease    Cough productive of clear sputum 06/03/2023   Diarrhea 06/18/2019   DJD (degenerative joint disease) 07/03/2021   DM2 (diabetes mellitus, type 2) (HCC) 11/10/2010   Dyspnea 12/05/2017   Dyspnea on exertion 02/09/2021   Edema 03/22/2011   Essential (primary) hypertension 11/10/2010   Formatting of this note might be different from the original.  Overview:   Qualifier: Diagnosis of   By: Gwenn Grimes      Last Assessment & Plan:   Watch blood pressure off of minoxidil and add medications as needed.   Essential hypertension 11/10/2010   Qualifier: Diagnosis of   By: Gwenn Grimes       Gastro-esophageal reflux disease without esophagitis  11/10/2010   GERD 11/10/2010   Qualifier: Diagnosis of   By: Gwenn Grimes       Hair loss 09/17/2019   Heart failure (HCC) 04/20/2021   Heart murmur    Hepatitis C    History of cervical cancer 03/22/2011   History of gout X 1   History of tobacco use 04/20/2021   Hx of atrial flutter 03/15/2021   Hyperglycemia due to type 2 diabetes mellitus (HCC) 04/20/2021   Hyperlipidemia    Hypertension associated with diabetes (HCC) 08/25/2018   Irritable bowel syndrome with diarrhea 06/18/2019   Limitation of activity due to disability 09/18/2021   Lymphedema of both lower extremities 03/22/2011   Metabolic alkalosis 03/28/2021   Microalbuminuria 04/20/2021   Migraine    monthly (12/05/2017)   Mild tricuspid regurgitation 04/02/2021   Mitral valve sclerosis 04/02/2021   Mixed hyperlipidemia 04/18/2020   Moderate aortic  stenosis 04/20/2021   Mononeuropathy of lower extremity 04/09/2011   Formatting of this note might be different from the original.  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  Severe, corrected by CPAP 18 with C-flex of 3  2/13 bipap 20/14 sm ff mask h/h biflex+3cm  Formatting of this note might be different from the original.  Sleep study 06/2020: IMPRESSIONS  - Moderate obstructive sleep apnea occurred during this study (AHI = 17.7/h), mostly REM related  - No significant central sleep apnea occurred during   On home oxygen  therapy    2L all the time (12/05/2017)   On supplemental oxygen  by nasal cannula 04/20/2021   Onychomycosis 06/18/2019   OSA treated with BiPAP    have CPAP at home too; wearing BiPAP right now (12/05/2017)   Osteoarthritis 11/28/2010   Qualifier: Diagnosis of   By: Lelon RIGGERS, Scott       Other long term (current) drug therapy 02/12/2022   Paroxysmal atrial fibrillation (HCC) 09/04/2021   Physical debility 05/10/2018   Formatting of this note might be different from the original.  Due to morbid obesity   Physical deconditioning 04/24/2021   Pneumonia    several times (12/05/2017)   Pneumonia due to gram-positive bacteria 08/24/2017   Pressure injury of both heels, unstageable (HCC) 07/03/2021   Pulmonary edema, acute (HCC) 02/21/2022   Renal  insufficiency 04/20/2021   Restless leg syndrome 03/22/2021   Restrictive lung disease secondary to obesity 01/28/2022   S/P hysterectomy 02/19/2021   Tinea pedis of both feet 11/12/2018   Unspecified atrial flutter (HCC) 02/12/2022   Upper GI bleed 07/18/2022   Venous insufficiency of both lower extremities 07/02/2021   Weakness 03/20/2021   Weight loss 03/15/2021    Medications:  Scheduled:   atorvastatin   20 mg Oral QPM   buPROPion  ER  100 mg Oral BID   busPIRone   7.5 mg Oral BID   Chlorhexidine  Gluconate Cloth  6 each Topical  Daily   ferrous sulfate   325 mg Oral Daily   fluticasone  furoate-vilanterol  1 puff Inhalation Daily   And   umeclidinium bromide   1 puff Inhalation Daily   furosemide   80 mg Intravenous BID   insulin  aspart  0-15 Units Subcutaneous TID WC   levothyroxine   150 mcg Oral Q0600   loratadine   10 mg Oral Daily   metolazone   2.5 mg Oral BID   montelukast   10 mg Oral QHS   mouth rinse  15 mL Mouth Rinse 4 times per day   oseltamivir   75 mg Oral BID   pantoprazole   40 mg Oral Daily   rOPINIRole   0.5 mg Oral QHS   sodium chloride  flush  10-40 mL Intracatheter Q12H    Assessment: 58 y.o. female with Aflutter, Eliquis  on hold, for heparin  pending decision regarding possible trach for hypercarbia.   2/7 AM: Aptt 69- therapeutic with heparin  running at 1900 units/hour. Heparin  level falsely elevated d/t recent apixaban  use. CBC remains stable. No signs of bleeding or issues with he heparin  infusion noted.   2/8 1100:  aPTT 100 sec, borderline supratherapautic after heparin  increased to 2400 units/hr. Heparin  anti-Xa level 0.75, remains elevated in setting of recent apixaban  use. Will continue utlizing aPTT for dosing until both levels therapeutic and correlating. Levels drawn appropriately by RN. No issues with infusion running or signs of bleeding per RN. CBC stable (Hgb 10.7, PLT 252).   Goal of Therapy:  aPTT 66-102 sec Heparin  level 0.3-0.7  units/ml Monitor platelets by anticoagulation protocol: Yes   Plan:  Decrease heparin  to 2300 units/hr Heparin  level and aPTT in 8 hours Heparin  level and aPTT daily until correlating Monitor CBC and s/sx of bleeding daily  Morna Breach, PharmD PGY-1 Acute Care Pharmacy Resident 11/09/2023 11:09 AM

## 2023-11-09 NOTE — Assessment & Plan Note (Signed)
 Remains afebrile. No new changes. - Continue Tamiflu  (2/6-2/11)  - Bcx NGTD over 24h

## 2023-11-09 NOTE — Plan of Care (Signed)
 FMTS Brief Progress Note  S: Seen on evening rounds.  Patient tells me that she is not so good after flipping back into RVR.  She tells me that she can tell when she is in RVR due to palpitations.  She denies any increased shortness of breath or chest pain with this.  RN at bedside and tells me that her heart rate has been increasing since the diltiazem  drip was stopped earlier in the day.   O: BP 113/75 (BP Location: Left Wrist)   Pulse 100   Temp 98.9 F (37.2 C) (Oral)   Resp (!) 22   Ht 5' 5 (1.651 m)   Wt (!) 148.2 kg   SpO2 96%   BMI 54.37 kg/m   Gen: Chronically ill-appearing but in good spirits HENT: Wearing [place] Card: Irregularly irregular, tachycardic, 3/6 systolic murmur  Pulm: Exam limited by body habitus, but normal WOB on 12L  A/P: AFib with RVR Back in RVR since dilt gtt was discontinued. HR in high 110s on the monitor. Symptomatic with palpitations in the room.  - restart diltiazem  drip - remains on IV amiodarone  at 30mg /hr  - Is on a heparin  gtt as well, hopefully we can transition to DOAC once we have a plan re: possible trach  - Cardiology is following, will see her in the morning, appreciate their assistance   Acute on Chronic Respiratory Failure with Hypoxia and Hypercarbia Breathing comfortably on 12L supplemental O2 at this time. Will be on BiPAP overnight.  - To be seen by ENT to discuss benefit of tracheostomy for her chronic respiratory failure and need for BiPAP - BiPAP between meals and overnight   HFpEF 1+ edema to mid-shins bilaterally. -2.3L since 0700. -7L this admission.  - Continue Lasix  80mg  BID - Continue metolazone  2.5mg  BID - K>4, Mg>2 while diuresing   Influenza infection Seems to be feeling relatively well overall, though this is likely a major trigger to her RVR and acute on chronic respiratory failure.   - Orders reviewed. Labs for AM ordered, which was adjusted as needed.  - If condition changes, plan includes discussion with  cards fellow +/- ICU for further suggestions    Chelsea Lynwood NOVAK, MD 11/09/2023, 7:53 PM PGY-3, [facility] Night Resident  Please page [phone] with questions.

## 2023-11-09 NOTE — Assessment & Plan Note (Addendum)
 Remains in atrial flutter, HR: 70-80 this morning down from 100-120. Cardiology recommended 2/8 switching from heparin  IV to Eliquis .  - If trach procedure is deferred/declined, can consider stopping IV heparin  and start Eliquis  5 mg BID  - Continue IV amiodarone  and diltiazem  +/- additional IV digoxin  depending on cards recs

## 2023-11-09 NOTE — Assessment & Plan Note (Signed)
 Net output -1.945 L on IV lasix  80 mg BID and metolazone  2.5 mg BID. Pressures remain WNL, tolerating diuresis well. Mag 2.3 WNL. K 3.8.  - Continue IV Lasix  80 mg BID and metolazone  2.5 mg BID.  - Continue to monitor BMP/magnesium .  - Continue I/Os and daily weights.

## 2023-11-09 NOTE — Progress Notes (Signed)
 PHARMACY - ANTICOAGULATION CONSULT NOTE  Pharmacy Consult for Heparin  Indication: atrial flutter  Allergies  Allergen Reactions   Iodinated Contrast Media Anaphylaxis   Penicillins Anaphylaxis    Patient tolerates cefepime  (08/2017)   Insulin  Lispro Other (See Comments)    Adder per [facility] from SNF   Minoxidil Hives    Patient Measurements: Height: 5' 5 (165.1 cm) Weight: (!) 148.2 kg (326 lb 11.6 oz) IBW/kg (Calculated) : 57 Heparin  Dosing Weight: 95 kg  Vital Signs: Temp: 97.9 F (36.6 C) (02/08 0900) Temp Source: Oral (02/08 0900) BP: 101/72 (02/08 1405) Pulse Rate: 95 (02/08 1405)  Labs: Recent Labs    11/07/23 0451 11/07/23 1746 11/08/23 0450 11/08/23 1300 11/08/23 1600 11/08/23 2300 11/09/23 0530 11/09/23 0908 11/09/23 1555  HGB 11.6*  --  11.2*  --   --   --  10.7*  --   --   HCT 37.9  --  36.8  --   --   --  35.9*  --   --   PLT 224  --  252  --   --   --  252  --   --   APTT  --    < > 69* 39*  --  58*  --  100*  --   HEPARINUNFRC  --   --  >1.10*  --   --   --   --  0.75* 0.29*  CREATININE 0.70  --  1.01*  --  0.82  --  0.83  --   --    < > = values in this interval not displayed.    Estimated Creatinine Clearance: 107.7 mL/min (by C-G formula based on SCr of 0.83 mg/dL).   Medical History: Past Medical History:  Diagnosis Date   Acquired hypothyroidism 11/13/2010   Qualifier: Diagnosis of   By: Pietro, MD, CODY Redell Dimes      Acute congestive heart failure (HCC) 03/18/2021   Acute idiopathic gout of left hand 02/16/2019   Acute kidney injury (HCC) 05/29/2023   Acute metabolic encephalopathy 09/29/2023   Acute on chronic diastolic CHF (congestive heart failure), NYHA class 3 (HCC) 09/25/2017   Acute on chronic respiratory failure with hypoxia and hypercapnia (HCC) 04/07/2022   Arthritis    qwhere (12/05/2017)   Asthma    Atrial flutter (HCC) 03/09/2021   Bleeding of the respiratory tract 04/07/2022   Bronchitis 05/30/2011   Cervical  cancer (HCC) 2006   CHF exacerbation (HCC) 07/30/2022   Chronic heart failure with preserved ejection fraction (HCC) 02/23/2018   Chronic lower back pain    Chronic respiratory failure with hypoxia (HCC) 07/02/2018   ABG 11/2017 7.36/69   Consistent with obesity hypoventilation syndrome   Complication of anesthesia    I have a hard time waking up from under it (12/05/2017)   COPD (chronic obstructive pulmonary disease) (HCC) 03/04/2015   Coronary artery disease    Cough productive of clear sputum 06/03/2023   Diarrhea 06/18/2019   DJD (degenerative joint disease) 07/03/2021   DM2 (diabetes mellitus, type 2) (HCC) 11/10/2010   Dyspnea 12/05/2017   Dyspnea on exertion 02/09/2021   Edema 03/22/2011   Essential (primary) hypertension 11/10/2010   Formatting of this note might be different from the original.  Overview:   Qualifier: Diagnosis of   By: Gwenn Grimes      Last Assessment & Plan:   Watch blood pressure off of minoxidil and add medications as needed.   Essential hypertension 11/10/2010   Qualifier:  Diagnosis of   By: Gwenn Grimes       Gastro-esophageal reflux disease without esophagitis 11/10/2010   GERD 11/10/2010   Qualifier: Diagnosis of   By: Gwenn Grimes       Hair loss 09/17/2019   Heart failure (HCC) 04/20/2021   Heart murmur    Hepatitis C    History of cervical cancer 03/22/2011   History of gout X 1   History of tobacco use 04/20/2021   Hx of atrial flutter 03/15/2021   Hyperglycemia due to type 2 diabetes mellitus (HCC) 04/20/2021   Hyperlipidemia    Hypertension associated with diabetes (HCC) 08/25/2018   Irritable bowel syndrome with diarrhea 06/18/2019   Limitation of activity due to disability 09/18/2021   Lymphedema of both lower extremities 03/22/2011   Metabolic alkalosis 03/28/2021   Microalbuminuria 04/20/2021   Migraine    monthly (12/05/2017)   Mild tricuspid regurgitation 04/02/2021   Mitral valve sclerosis 04/02/2021   Mixed  hyperlipidemia 04/18/2020   Moderate aortic stenosis 04/20/2021   Mononeuropathy of lower extremity 04/09/2011   Formatting of this note might be different from the original.  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  Severe, corrected by CPAP 18 with C-flex of 3  2/13 bipap 20/14 sm ff mask h/h biflex+3cm  Formatting of this note might be different from the original.  Sleep study 06/2020: IMPRESSIONS  - Moderate obstructive sleep apnea occurred during this study (AHI = 17.7/h), mostly REM related  - No significant central sleep apnea occurred during   On home oxygen  therapy    2L all the time (12/05/2017)   On supplemental oxygen  by nasal cannula 04/20/2021   Onychomycosis 06/18/2019   OSA treated with BiPAP    have CPAP at home too; wearing BiPAP right now (12/05/2017)   Osteoarthritis 11/28/2010   Qualifier: Diagnosis of   By: Lelon RIGGERS, Scott       Other long term (current) drug therapy 02/12/2022   Paroxysmal atrial fibrillation (HCC) 09/04/2021   Physical debility 05/10/2018   Formatting of this note might be different from the original.  Due to morbid obesity   Physical deconditioning 04/24/2021   Pneumonia    several times (12/05/2017)   Pneumonia due to gram-positive bacteria 08/24/2017   Pressure injury of both heels, unstageable (HCC) 07/03/2021   Pulmonary  edema, acute (HCC) 02/21/2022   Renal insufficiency 04/20/2021   Restless leg syndrome 03/22/2021   Restrictive lung disease secondary to obesity 01/28/2022   S/P hysterectomy 02/19/2021   Tinea pedis of both feet 11/12/2018   Unspecified atrial flutter (HCC) 02/12/2022   Upper GI bleed 07/18/2022   Venous insufficiency of both lower extremities 07/02/2021   Weakness 03/20/2021   Weight loss 03/15/2021    Medications:  Scheduled:   atorvastatin   20 mg Oral QPM   buPROPion  ER  100 mg Oral  BID   busPIRone   7.5 mg Oral BID   Chlorhexidine  Gluconate Cloth  6 each Topical Daily   ferrous sulfate   325 mg Oral Daily   fluticasone  furoate-vilanterol  1 puff Inhalation Daily   And   umeclidinium bromide   1 puff Inhalation Daily   furosemide   80 mg Intravenous BID   insulin  aspart  0-15 Units Subcutaneous TID WC   levothyroxine   150 mcg Oral Q0600   loratadine   10 mg Oral Daily   metolazone   2.5 mg Oral BID   montelukast   10 mg Oral QHS   mouth rinse  15 mL Mouth Rinse 4 times per day   oseltamivir   75 mg Oral BID   pantoprazole   40 mg Oral Daily   rOPINIRole   0.5 mg Oral QHS   sodium chloride  flush  10-40 mL Intracatheter Q12H    Assessment: 58 y.o. female with Aflutter, apixaban  on hold, for heparin  pending decision regarding possible trach for hypercarbia.  Last apixaban  dose 2/4 PM.    Heparin  level and aPTT appear to be correlating.  Heparin  level slightly below goal on 2300 units/hr.  Previously slightly above goal on 2400 units/hr.  Goal of Therapy:  Heparin  level 0.3-0.7 units/ml Monitor platelets by anticoagulation protocol: Yes   Plan:  Increase heparin  to 2350 units/hr Heparin  level and CBC daily  [employer], Pharm.D., BCPS Clinical Pharmacist  **Pharmacist phone directory can be found on [email or web address] listed under [facility].  11/09/2023 4:52 PM

## 2023-11-10 ENCOUNTER — Encounter (HOSPITAL_COMMUNITY): Payer: Self-pay | Admitting: Anesthesiology

## 2023-11-10 DIAGNOSIS — I509 Heart failure, unspecified: Secondary | ICD-10-CM | POA: Diagnosis not present

## 2023-11-10 DIAGNOSIS — I4892 Unspecified atrial flutter: Secondary | ICD-10-CM | POA: Diagnosis not present

## 2023-11-10 DIAGNOSIS — J9601 Acute respiratory failure with hypoxia: Secondary | ICD-10-CM | POA: Diagnosis not present

## 2023-11-10 DIAGNOSIS — I4891 Unspecified atrial fibrillation: Secondary | ICD-10-CM | POA: Diagnosis not present

## 2023-11-10 DIAGNOSIS — J111 Influenza due to unidentified influenza virus with other respiratory manifestations: Secondary | ICD-10-CM | POA: Diagnosis not present

## 2023-11-10 DIAGNOSIS — I5033 Acute on chronic diastolic (congestive) heart failure: Secondary | ICD-10-CM | POA: Diagnosis not present

## 2023-11-10 LAB — BASIC METABOLIC PANEL
Anion gap: 12 (ref 5–15)
Anion gap: 14 (ref 5–15)
BUN: 19 mg/dL (ref 6–20)
BUN: 21 mg/dL — ABNORMAL HIGH (ref 6–20)
CO2: 37 mmol/L — ABNORMAL HIGH (ref 22–32)
CO2: 36 mmol/L — ABNORMAL HIGH (ref 22–32)
Calcium: 8.6 mg/dL — ABNORMAL LOW (ref 8.9–10.3)
Calcium: 9.4 mg/dL (ref 8.9–10.3)
Chloride: 87 mmol/L — ABNORMAL LOW (ref 98–111)
Chloride: 85 mmol/L — ABNORMAL LOW (ref 98–111)
Creatinine, Ser: 0.87 mg/dL (ref 0.44–1.00)
Creatinine, Ser: 0.86 mg/dL (ref 0.44–1.00)
GFR, Estimated: >60 mL/min (ref >60)
GFR, Estimated: >60 mL/min (ref >60)
Glucose, Bld: 191 mg/dL — ABNORMAL HIGH (ref 70–99)
Glucose, Bld: 236 mg/dL — ABNORMAL HIGH (ref 70–99)
Potassium: 3.8 mmol/L (ref 3.5–5.1)
Potassium: 3.9 mmol/L (ref 3.5–5.1)
Sodium: 136 mmol/L (ref 135–145)
Sodium: 135 mmol/L (ref 135–145)

## 2023-11-10 LAB — CBC
HCT: 37.5 % (ref 36.0–46.0)
Hemoglobin: 11.5 g/dL — ABNORMAL LOW (ref 12.0–15.0)
MCH: 26.7 pg (ref 26.0–34.0)
MCHC: 30.7 g/dL (ref 30.0–36.0)
MCV: 87 fL (ref 80.0–100.0)
Platelets: 264 10*3/uL (ref 150–400)
RBC: 4.31 MIL/uL (ref 3.87–5.11)
RDW: 15.3 % (ref 11.5–15.5)
WBC: 7.7 10*3/uL (ref 4.0–10.5)
nRBC: 0 % (ref 0.0–0.2)

## 2023-11-10 LAB — BRAIN NATRIURETIC PEPTIDE: B Natriuretic Peptide: 111.6 pg/mL — ABNORMAL HIGH (ref 0.0–100.0)

## 2023-11-10 LAB — MAGNESIUM
Magnesium: 1.7 mg/dL (ref 1.7–2.4)
Magnesium: 2.1 mg/dL (ref 1.7–2.4)

## 2023-11-10 LAB — GLUCOSE, CAPILLARY
Glucose-Capillary: 152 mg/dL — ABNORMAL HIGH (ref 70–99)
Glucose-Capillary: 154 mg/dL — ABNORMAL HIGH (ref 70–99)
Glucose-Capillary: 182 mg/dL — ABNORMAL HIGH (ref 70–99)
Glucose-Capillary: 241 mg/dL — ABNORMAL HIGH (ref 70–99)

## 2023-11-10 LAB — HEPARIN LEVEL (UNFRACTIONATED): Heparin Unfractionated: 0.43 [IU]/mL (ref 0.30–0.70)

## 2023-11-10 MED ORDER — STERILE WATER FOR INJECTION IJ SOLN
INTRAMUSCULAR | Status: AC
  Administered 2023-11-10: 5 mL
  Filled 2023-11-10: qty 10

## 2023-11-10 MED ORDER — MAGNESIUM SULFATE 2 GM/50ML IV SOLN
2.0000 g | Freq: Once | INTRAVENOUS | Status: AC
  Administered 2023-11-10: 2 g via INTRAVENOUS
  Filled 2023-11-10: qty 50

## 2023-11-10 MED ORDER — APIXABAN 5 MG PO TABS: 5.0000 mg | ORAL_TABLET | Freq: Two times a day (BID) | ORAL | Status: DC

## 2023-11-10 MED ORDER — HEPARIN (PORCINE) 25000 UT/250ML-% IV SOLN
2350.0000 [IU]/h | INTRAVENOUS | Status: DC
Start: 2023-11-10 — End: 2023-11-13
  Administered 2023-11-10 – 2023-11-13 (×7): 2350 [IU]/h via INTRAVENOUS
  Filled 2023-11-10 (×6): qty 250

## 2023-11-10 MED ORDER — ACETAZOLAMIDE SODIUM 500 MG IJ SOLR
500.0000 mg | Freq: Once | INTRAMUSCULAR | Status: AC
  Administered 2023-11-10: 500 mg via INTRAVENOUS
  Filled 2023-11-10: qty 500

## 2023-11-10 MED ORDER — POTASSIUM CHLORIDE CRYS ER 20 MEQ PO TBCR
40.0000 meq | EXTENDED_RELEASE_TABLET | ORAL | Status: AC
  Administered 2023-11-10 (×2): 40 meq via ORAL
  Filled 2023-11-10 (×2): qty 2

## 2023-11-10 MED ORDER — LOPERAMIDE HCL 2 MG PO CAPS
2.0000 mg | ORAL_CAPSULE | ORAL | Status: DC | PRN
  Administered 2023-11-10 – 2023-11-15 (×5): 2 mg via ORAL
  Filled 2023-11-10 (×5): qty 1

## 2023-11-10 MED ORDER — POTASSIUM CHLORIDE CRYS ER 20 MEQ PO TBCR
40.0000 meq | EXTENDED_RELEASE_TABLET | Freq: Once | ORAL | Status: AC
  Administered 2023-11-10: 40 meq via ORAL
  Filled 2023-11-10: qty 4

## 2023-11-10 NOTE — Progress Notes (Signed)
 Pt and spouse have decided that the pt does not want to have the trachea placed.

## 2023-11-10 NOTE — Plan of Care (Signed)

## 2023-11-10 NOTE — Plan of Care (Signed)
  Problem: Clinical Measurements: Goal: Respiratory complications will improve Outcome: Progressing Goal: Cardiovascular complication will be avoided Outcome: Progressing   Problem: Pain Managment: Goal: General experience of comfort will improve and/or be controlled Outcome: Progressing   Problem: Safety: Goal: Ability to remain free from injury will improve Outcome: Progressing

## 2023-11-10 NOTE — Assessment & Plan Note (Addendum)
 Patient with an additional 5.3 L of output the past 24 hours. Continues to be difficult to assess volume status given body habitus.  Lungs are without significant signs of pulmonary edema. - Will dose Lasix  80 mg IV BID, and at metolazone  2.5 mg BID  - PM BMP and mag - Continue Strict I/Os - Daily weights - Monitor BMP and Mag daily with diuresis  - Replete lytes as needed  - Repeat BNP improved 111 - Hold  jardiance  25 mg

## 2023-11-10 NOTE — Plan of Care (Signed)
 Received message from ENT on-call, Dr. Roark, the patient is a clinic of [facility] and to reach out to their on-call provider to discuss trach placement.  Called Dr. Carlie, he states she will be seen by the team tomorrow for consultation.  Chelsea Pagan, MD 11/10/2023, 3:15 PM PGY-2, [facility] pager [phone]

## 2023-11-10 NOTE — Assessment & Plan Note (Signed)
 Patient remains afebrile and without significant symptoms. - Tamiflu  (2/6 - ) will complete 5 day course  - AM CBC  - Bcx NGTD

## 2023-11-10 NOTE — Progress Notes (Signed)
 PHARMACY - ANTICOAGULATION CONSULT NOTE  Pharmacy Consult for Heparin  Indication: atrial flutter  Allergies  Allergen Reactions   Iodinated Contrast Media Anaphylaxis   Penicillins Anaphylaxis    Patient tolerates cefepime  (08/2017)   Insulin  Lispro Other (See Comments)    Adder per [facility] from SNF   Minoxidil Hives    Patient Measurements: Height: 5' 5 (165.1 cm) Weight: (!) 146.6 kg (323 lb 3.1 oz) IBW/kg (Calculated) : 57 Heparin  Dosing Weight: 95 kg  Vital Signs: Temp: 98.3 F (36.8 C) (02/09 0756) Temp Source: Oral (02/09 0756) BP: 108/75 (02/09 0756) Pulse Rate: 93 (02/09 0800)  Labs: Recent Labs    11/08/23 0450 11/08/23 1300 11/08/23 1600 11/08/23 2300 11/09/23 0530 11/09/23 0908 11/09/23 1555 11/10/23 0540  HGB 11.2*  --   --   --  10.7*  --   --  11.5*  HCT 36.8  --   --   --  35.9*  --   --  37.5  PLT 252  --   --   --  252  --   --  264  APTT 69*   < >  --  58*  --  100* 47*  --   HEPARINUNFRC >1.10*  --   --   --   --  0.75* 0.29* 0.43  CREATININE 1.01*  --  0.82  --  0.83  --   --  0.87   < > = values in this interval not displayed.    Estimated Creatinine Clearance: 102 mL/min (by C-G formula based on SCr of 0.87 mg/dL).   Medical History: Past Medical History:  Diagnosis Date   Acquired hypothyroidism 11/13/2010   Qualifier: Diagnosis of   By: Pietro, MD, CODY Redell Dimes      Acute congestive heart failure (HCC) 03/18/2021   Acute idiopathic gout of left hand 02/16/2019   Acute kidney injury (HCC) 05/29/2023   Acute metabolic encephalopathy 09/29/2023   Acute on chronic diastolic CHF (congestive heart failure), NYHA class 3 (HCC) 09/25/2017   Acute on chronic respiratory failure with hypoxia and hypercapnia (HCC) 04/07/2022   Arthritis    qwhere (12/05/2017)   Asthma    Atrial flutter (HCC) 03/09/2021   Bleeding of the respiratory tract 04/07/2022   Bronchitis 05/30/2011   Cervical cancer (HCC) 2006   CHF exacerbation (HCC)  07/30/2022   Chronic heart failure with preserved ejection fraction (HCC) 02/23/2018   Chronic lower back pain    Chronic respiratory failure with hypoxia (HCC) 07/02/2018   ABG 11/2017 7.36/69   Consistent with obesity hypoventilation syndrome   Complication of anesthesia    I have a hard time waking up from under it (12/05/2017)   COPD (chronic obstructive pulmonary disease) (HCC) 03/04/2015   Coronary artery disease    Cough productive of clear sputum 06/03/2023   Diarrhea 06/18/2019   DJD (degenerative joint disease) 07/03/2021   DM2 (diabetes mellitus, type 2) (HCC) 11/10/2010   Dyspnea 12/05/2017   Dyspnea on exertion 02/09/2021   Edema 03/22/2011   Essential (primary) hypertension 11/10/2010   Formatting of this note might be different from the original.  Overview:   Qualifier: Diagnosis of   By: Gwenn Grimes      Last Assessment & Plan:   Watch blood pressure off of minoxidil and add medications as needed.   Essential hypertension 11/10/2010   Qualifier: Diagnosis of   By: Gwenn Grimes       Gastro-esophageal reflux disease without esophagitis 11/10/2010   GERD  11/10/2010   Qualifier: Diagnosis of   By: Gwenn Grimes       Hair loss 09/17/2019   Heart failure (HCC) 04/20/2021   Heart murmur    Hepatitis C    History of cervical cancer 03/22/2011   History of gout X 1   History of tobacco use 04/20/2021   Hx of atrial flutter 03/15/2021   Hyperglycemia due to type 2 diabetes mellitus (HCC) 04/20/2021   Hyperlipidemia    Hypertension associated with diabetes (HCC) 08/25/2018   Irritable bowel syndrome with diarrhea 06/18/2019   Limitation of activity due to disability 09/18/2021   Lymphedema of both lower extremities 03/22/2011   Metabolic alkalosis 03/28/2021   Microalbuminuria 04/20/2021   Migraine    monthly (12/05/2017)   Mild tricuspid regurgitation 04/02/2021   Mitral valve sclerosis 04/02/2021   Mixed hyperlipidemia 04/18/2020   Moderate aortic  stenosis 04/20/2021   Mononeuropathy of lower extremity 04/09/2011   Formatting of this note might be different from the original.  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  Severe, corrected by CPAP 18 with C-flex of 3  2/13 bipap 20/14 sm ff mask h/h biflex+3cm  Formatting of this note might be different from the original.  Sleep study 06/2020: IMPRESSIONS  - Moderate obstructive sleep apnea occurred during this study (AHI = 17.7/h), mostly REM related  - No significant central sleep apnea occurred during   On home oxygen  therapy    2L all the time (12/05/2017)   On supplemental oxygen  by nasal cannula 04/20/2021   Onychomycosis 06/18/2019   OSA treated with BiPAP    have CPAP at home too; wearing BiPAP right now (12/05/2017)   Osteoarthritis 11/28/2010   Qualifier: Diagnosis of   By: Lelon RIGGERS, Scott       Other long term (current) drug therapy 02/12/2022   Paroxysmal atrial fibrillation (HCC) 09/04/2021   Physical debility 05/10/2018   Formatting of this note might be different from the original.  Due to morbid obesity   Physical deconditioning 04/24/2021   Pneumonia    several times (12/05/2017)   Pneumonia due to gram-positive bacteria 08/24/2017   Pressure injury of both heels, unstageable (HCC) 07/03/2021   Pulmonary edema, acute (HCC) 02/21/2022   Renal  insufficiency 04/20/2021   Restless leg syndrome 03/22/2021   Restrictive lung disease secondary to obesity 01/28/2022   S/P hysterectomy 02/19/2021   Tinea pedis of both feet 11/12/2018   Unspecified atrial flutter (HCC) 02/12/2022   Upper GI bleed 07/18/2022   Venous insufficiency of both lower extremities 07/02/2021   Weakness 03/20/2021   Weight loss 03/15/2021    Medications:  Scheduled:   acetaZOLAMIDE   500 mg Intravenous Once   atorvastatin   20 mg Oral QPM   buPROPion  ER  100 mg Oral BID   busPIRone   7.5 mg Oral BID   Chlorhexidine  Gluconate Cloth  6 each Topical Daily   ferrous sulfate   325 mg Oral Daily   fluticasone  furoate-vilanterol  1 puff Inhalation Daily   And   umeclidinium bromide   1 puff Inhalation Daily   furosemide   80 mg Intravenous BID   insulin  aspart  0-15 Units Subcutaneous TID WC   levothyroxine   150 mcg Oral Q0600   loratadine   10 mg Oral Daily   metolazone   2.5 mg Oral BID   montelukast   10 mg Oral QHS   mouth rinse  15 mL Mouth Rinse 4 times per day   oseltamivir   75 mg Oral BID   pantoprazole   40 mg Oral Daily   potassium chloride   40 mEq Oral Q4H   rOPINIRole   0.5 mg Oral QHS   sodium chloride  flush  10-40 mL Intracatheter Q12H    Assessment: 58 y.o. female with Aflutter, apixaban  on hold, for heparin  pending decision regarding possible trach for hypercarbia.  Last apixaban  dose 2/4 PM.    2/8 AM: heparin  level 0.43, therapeutic on 2350 units/hr. No issues with infusion running or signs of bleeding per RN. CBC stable (Hgb 11.5, PLT 264).   Goal of Therapy:  Heparin  level 0.3-0.7 units/ml Monitor platelets by anticoagulation protocol: Yes   Plan:  Continue heparin  to 2350 units/hr Heparin  level and CBC daily  Morna Breach, PharmD PGY-1 Acute Care Pharmacy Resident 11/10/2023 9:03 AM

## 2023-11-10 NOTE — Progress Notes (Signed)
 Daily Progress Note Intern Pager: [phone]  Patient name: Chelsea [name] Medical record number: [id number] Date of birth: 12-02-1965 Age: 58 y.o. Gender: female  Primary Care Provider: Pcp, No Consultants: Cardiology, pulmonology Code Status: Full  Pt Overview and Major Events to Date:  2/2: Admitted 2/5: Patient with atrial flutter, cardiology consulted, started on diltiazem  2/6: Maxed out on diltiazem  and amio, added IV dig per cards 2/7: Bolus of amio 150 cc x1, IV dig. 2/8: Patient restarted on dilt drip after recurrence of Afib w/ RVR   2/9: ECHO   Assessment and Plan:  Patient is a 58 year old female presenting with acute hypoxic respiratory failure secondary to pneumonia versus heart failure exacerbation.  She has a longstanding history of hypercarbic respiratory failure due to poor respiratory drive. Assessment & Plan Acute respiratory failure with hypoxia and hypercarbia (HCC) Patient is awake and alert on high flow nasal cannula this morning.  Oxygenating appropriately to 93%.  She denies any chest discomfort or wheezing.  She does have a cough most likely related to her influenza but does not want any symptomatic treatment at this time. - Continuous pulse ox - Continue BiPAP while sleeping/naps, wean as tolerated to O2goal of 88-92% - NPO while on BiPAP  -Patient tolerating HFNC while awake may have diet. -Patient has completed her course of CTX  - Continue holding oxycodone  and do not administer benzos, given respiratory status and altered mentation/somnolence throughout admission  - AM CBC, BMP  - ENT consult, pending eval for trach placement Atrial flutter (HCC) Patient back in aflutter during yesterday evening rates in 120s and restarted on dilt drip. Rates in the low 100s this morning and back in Atrial flutter. She denies palpitations this morning.   - Cards: following, continue dilt and amiodarone  drip and transition to PO when able, IV acetazolamide  given  uptrending serum bicarb  - Follow-up  ECHO  - will consider further adjustments after echo results  - Holding home amiodarone  200 mg daily   (HFpEF) heart failure with preserved ejection fraction [facility]) Patient with an additional 5.3 L of output the past 24 hours. Continues to be difficult to assess volume status given body habitus.  Lungs are without significant signs of pulmonary edema. - Will dose Lasix  80 mg IV BID, and at metolazone  2.5 mg BID  - PM BMP and mag - Continue Strict I/Os - Daily weights - Monitor BMP and Mag daily with diuresis  - Replete lytes as needed  - Repeat BNP improved 111 - Hold  jardiance  25 mg  H1N1 influenza Patient remains afebrile and without significant symptoms. - Tamiflu  (2/6 - ) will complete 5 day course  - AM CBC  - Bcx NGTD   Chronic and Stable Problems:  COPD: Continue home inhalers T2DM: Restarted her diet, will adjust insulin  needs as appropriate Anemia: Continue iron  supplementation HLD: Continue home atorvastatin  Hypothyroidism: Continue home levothyroxine  RLS: Continue home ropinerole  GERD: Continue pantoprazole  Chronic pain: Consider restarting if mentation improves  FEN/GI: Carb modified PPx: Heparin   Dispo: Return to ALF  pending clinical improvement .    Subjective:  On interview, patient slept better overnight.  Denied chest pain or shortness of breath. Asymptomatic with a flutter yesterday evening and didn't recur overnight.   Objective: Temp:  [97.7 F (36.5 C)-98.9 F (37.2 C)] 98.3 F (36.8 C) (02/09 0756) Pulse Rate:  [29-119] 93 (02/09 0800) Resp:  [19-25] 20 (02/09 0800) BP: (93-118)/(61-84) 108/75 (02/09 0756) SpO2:  [91 %-99 %] 93 % (  02/09 0800) FiO2 (%):  [60 %] 60 % (02/09 0115) Weight:  [146.6 kg] 146.6 kg (02/09 0458) Physical Exam: General: A&O, no acute distress, Cardiovascular: Per tele: Irregularly irregular, heart rate 90s Respiratory: Respiratory rate WNL, appeared comfortable on HF [place] , no  increased work of breathing Abdomen: Nontender Extremities: Moves all 4 spontaneously and appropriately, chronic venous skin changes   Laboratory: Most recent CBC Lab Results  Component Value Date   WBC 7.7 11/10/2023   HGB 11.5 (L) 11/10/2023   HCT 37.5 11/10/2023   MCV 87.0 11/10/2023   PLT 264 11/10/2023   Most recent BMP    Latest Ref Rng & Units 11/10/2023    5:40 AM  BMP  Glucose 70 - 99 mg/dL 808   BUN 6 - 20 mg/dL 19   Creatinine 9.55 - 1.00 mg/dL 9.12   Sodium 864 - 854 mmol/L 136   Potassium 3.5 - 5.1 mmol/L 3.8   Chloride 98 - 111 mmol/L 87   CO2 22 - 32 mmol/L 37   Calcium  8.9 - 10.3 mg/dL 8.6     Lenard Calin, MD 11/10/2023, 8:19 AM  PGY-1, [facility] FPTS Intern pager: [phone], text pages welcome Secure chat group [facility] Teaching Service

## 2023-11-10 NOTE — Progress Notes (Signed)
 Progress Note  Patient Name: Chelsea [name] Date of Encounter: 11/10/2023  Primary Cardiologist: Oneil Parchment, MD  Subjective   No acute events overnight. Diltiazem  drip was weaned off briefly yesterday resulting in elevated heart rates. Diltiazem  drip had to be started back again.  Currently HR ranging between 90 and 100s.  Patient denies any SOB.  More alert and oriented compared to yesterday.  Inpatient Medications    Scheduled Meds:  atorvastatin   20 mg Oral QPM   buPROPion  ER  100 mg Oral BID   busPIRone   7.5 mg Oral BID   Chlorhexidine  Gluconate Cloth  6 each Topical Daily   ferrous sulfate   325 mg Oral Daily   fluticasone  furoate-vilanterol  1 puff Inhalation Daily   And   umeclidinium bromide   1 puff Inhalation Daily   furosemide   80 mg Intravenous BID   insulin  aspart  0-15 Units Subcutaneous TID WC   levothyroxine   150 mcg Oral Q0600   loratadine   10 mg Oral Daily   metolazone   2.5 mg Oral BID   montelukast   10 mg Oral QHS   mouth rinse  15 mL Mouth Rinse 4 times per day   oseltamivir   75 mg Oral BID   pantoprazole   40 mg Oral Daily   potassium chloride   40 mEq Oral Q4H   rOPINIRole   0.5 mg Oral QHS   sodium chloride  flush  10-40 mL Intracatheter Q12H   Continuous Infusions:  amiodarone  30 mg/hr (11/10/23 0757)   diltiazem  (CARDIZEM ) infusion 15 mg/hr (11/10/23 0757)   heparin  2,350 Units/hr (11/10/23 0757)   PRN Meds: acetaminophen , albuterol , diclofenac  Sodium, guaiFENesin -dextromethorphan , melatonin, mouth rinse, sodium chloride  flush, sodium chloride  flush   Vital Signs    Vitals:   11/10/23 0645 11/10/23 0741 11/10/23 0756 11/10/23 0800  BP:   108/75   Pulse: 93 92 94 93  Resp: 19 (!) 21 (!) 25 20  Temp:   98.3 F (36.8 C)   TempSrc:   Oral   SpO2: 93% 92% 94% 93%  Weight:      Height:        Intake/Output Summary (Last 24 hours) at 11/10/2023 0830 Last data filed at 11/10/2023 0757 Gross per 24 hour  Intake 3059.77 ml  Output 5350 ml   Net -2290.23 ml   Filed Weights   11/08/23 0402 11/09/23 0603 11/10/23 0458  Weight: (!) 149.7 kg (!) 148.2 kg (!) 146.6 kg    Telemetry     Personally reviewed, atrial flutter, HR 90-100s.  ECG    Not performed today  Physical Exam   GEN: No acute distress.   Neck: Unable to examine due to body habitus. Cardiac: RRR, no murmur, rub, or gallop.  Respiratory: Nonlabored. Clear to auscultation bilaterally. GI: Soft, nontender, bowel sounds present. MS: 2+ pitting edema; No deformity. Neuro:  Nonfocal. Psych: Alert and oriented x 3. Normal affect.  Labs    Chemistry Recent Labs  Lab 11/05/23 [phone] 11/06/23 0545 11/08/23 1600 11/09/23 0530 11/10/23 0540  NA 137   < > 137 138 136  K 3.8   < > 3.8 3.5 3.8  CL 91*   < > 92* 86* 87*  CO2 33*   < > 35* 36* 37*  GLUCOSE 162*   < > 242* 169* 191*  BUN 38*   < > 16 14 19   CREATININE 0.94   < > 0.82 0.83 0.87  CALCIUM  8.9   < > 8.3* 8.9 8.6*  PROT 7.2  --   --   --   --  ALBUMIN  2.8*  --   --   --   --   AST 13*  --   --   --   --   ALT 12  --   --   --   --   ALKPHOS 83  --   --   --   --   BILITOT 0.4  --   --   --   --   GFRNONAA >60   < > >60 >60 >60  ANIONGAP 13   < > 10 16* 12   < > = values in this interval not displayed.     Hematology Recent Labs  Lab 11/08/23 0450 11/09/23 0530 11/10/23 0540  WBC 7.1 6.1 7.7  RBC 4.20 4.08 4.31  HGB 11.2* 10.7* 11.5*  HCT 36.8 35.9* 37.5  MCV 87.6 88.0 87.0  MCH 26.7 26.2 26.7  MCHC 30.4 29.8* 30.7  RDW 15.5 15.5 15.3  PLT 252 252 264    Cardiac Enzymes Recent Labs  Lab 11/04/23 2046  TROPONINIHS 11    BNP Recent Labs  Lab 11/03/23 1951  BNP 411.3*     DDimerNo results for input(s): DDIMER in the last 168 hours.   Radiology    No results found.    Assessment & Plan     Acute on chronic diastolic heart failure A-fib with RVR, currently rate controlled (In the setting of flu infection) Influenza infection   -5.3L urine output in the  last 24 hours with net -2.3L on IV Lasix  80 mg twice daily.  Diltiazem  drip was weaned off briefly yesterday resulting in atrial flutter with RVR and hence the drip had to be started back again.  Likely secondary to volume overload. Will keep diltiazem  and amiodarone  drips until she is near compensated, then transition to p.o. medications.  Currently on heparin  drip, can switch to DOAC if patient is able to tolerate p.o.  Obtain 2D echocardiogram to rule out any cardiomyopathy.  Serum bicarbonate trending up, 37 today and serum potassium 3.8.  Will administer 1 dose of IV acetazolamide  500 mg in addition to IV Lasix  80 mg twice daily and metolazone  2.5 mg twice daily.  -Influenza infection management per primary team.  Signed, Diannah SHAUNNA Maywood, MD  11/10/2023, 8:30 AM

## 2023-11-10 NOTE — Assessment & Plan Note (Addendum)
 Patient back in aflutter during yesterday evening rates in 120s and restarted on dilt drip. Rates in the low 100s this morning and back in Atrial flutter. She denies palpitations this morning.   - Cards: following, continue dilt and amiodarone  drip and transition to PO when able, IV acetazolamide  given uptrending serum bicarb  - Follow-up  ECHO  - will consider further adjustments after echo results  - Holding home amiodarone  200 mg daily

## 2023-11-10 NOTE — Assessment & Plan Note (Signed)
 Patient is awake and alert on high flow nasal cannula this morning.  Oxygenating appropriately to 93%.  She denies any chest discomfort or wheezing.  She does have a cough most likely related to her influenza but does not want any symptomatic treatment at this time. - Continuous pulse ox - Continue BiPAP while sleeping/naps, wean as tolerated to O2goal of 88-92% - NPO while on BiPAP  -Patient tolerating HFNC while awake may have diet. -Patient has completed her course of CTX  - Continue holding oxycodone  and do not administer benzos, given respiratory status and altered mentation/somnolence throughout admission  - AM CBC, BMP  - ENT consult, pending eval for trach placement

## 2023-11-10 NOTE — Plan of Care (Signed)
  Problem: Education: Goal: Ability to describe self-care measures that may prevent or decrease complications (Diabetes Survival Skills Education) will improve Outcome: Progressing   Problem: Fluid Volume: Goal: Ability to maintain a balanced intake and output will improve Outcome: Progressing   Problem: Health Behavior/Discharge Planning: Goal: Ability to identify and utilize available resources and services will improve Outcome: Progressing   Problem: Metabolic: Goal: Ability to maintain appropriate glucose levels will improve Outcome: Progressing   Problem: Nutritional: Goal: Maintenance of adequate nutrition will improve Outcome: Progressing

## 2023-11-11 ENCOUNTER — Encounter (HOSPITAL_COMMUNITY)
Admission: EM | Disposition: A | Discharge status: Skilled Nursing Facility | Payer: Self-pay | Source: Skilled Nursing Facility | Attending: Family Medicine

## 2023-11-11 ENCOUNTER — Inpatient Hospital Stay (HOSPITAL_COMMUNITY): Payer: Medicare Other

## 2023-11-11 ENCOUNTER — Inpatient Hospital Stay: Payer: Medicare Other | Admitting: Nurse Practitioner

## 2023-11-11 DIAGNOSIS — I4891 Unspecified atrial fibrillation: Secondary | ICD-10-CM | POA: Diagnosis not present

## 2023-11-11 DIAGNOSIS — J9621 Acute and chronic respiratory failure with hypoxia: Secondary | ICD-10-CM | POA: Diagnosis not present

## 2023-11-11 DIAGNOSIS — I4892 Unspecified atrial flutter: Secondary | ICD-10-CM | POA: Diagnosis not present

## 2023-11-11 DIAGNOSIS — J9622 Acute and chronic respiratory failure with hypercapnia: Secondary | ICD-10-CM | POA: Diagnosis not present

## 2023-11-11 DIAGNOSIS — I5033 Acute on chronic diastolic (congestive) heart failure: Secondary | ICD-10-CM | POA: Diagnosis not present

## 2023-11-11 DIAGNOSIS — I483 Typical atrial flutter: Secondary | ICD-10-CM | POA: Diagnosis not present

## 2023-11-11 LAB — BASIC METABOLIC PANEL
Anion gap: 19 — ABNORMAL HIGH (ref 5–15)
Anion gap: 13 (ref 5–15)
BUN: 20 mg/dL (ref 6–20)
BUN: 23 mg/dL — ABNORMAL HIGH (ref 6–20)
CO2: 32 mmol/L (ref 22–32)
CO2: 34 mmol/L — ABNORMAL HIGH (ref 22–32)
Calcium: 9.4 mg/dL (ref 8.9–10.3)
Calcium: 9.5 mg/dL (ref 8.9–10.3)
Chloride: 85 mmol/L — ABNORMAL LOW (ref 98–111)
Chloride: 88 mmol/L — ABNORMAL LOW (ref 98–111)
Creatinine, Ser: 1.01 mg/dL — ABNORMAL HIGH (ref 0.44–1.00)
Creatinine, Ser: 1.03 mg/dL — ABNORMAL HIGH (ref 0.44–1.00)
GFR, Estimated: >60 mL/min (ref >60)
GFR, Estimated: >60 mL/min (ref >60)
Glucose, Bld: 184 mg/dL — ABNORMAL HIGH (ref 70–99)
Glucose, Bld: 184 mg/dL — ABNORMAL HIGH (ref 70–99)
Potassium: 4.1 mmol/L (ref 3.5–5.1)
Potassium: 4.2 mmol/L (ref 3.5–5.1)
Sodium: 136 mmol/L (ref 135–145)
Sodium: 135 mmol/L (ref 135–145)

## 2023-11-11 LAB — CBC
HCT: 39 % (ref 36.0–46.0)
Hemoglobin: 12 g/dL (ref 12.0–15.0)
MCH: 26.8 pg (ref 26.0–34.0)
MCHC: 30.8 g/dL (ref 30.0–36.0)
MCV: 87.1 fL (ref 80.0–100.0)
Platelets: 279 10*3/uL (ref 150–400)
RBC: 4.48 MIL/uL (ref 3.87–5.11)
RDW: 15.3 % (ref 11.5–15.5)
WBC: 8.6 10*3/uL (ref 4.0–10.5)
nRBC: 0 % (ref 0.0–0.2)

## 2023-11-11 LAB — ECHOCARDIOGRAM COMPLETE
AR max vel: 0.87 cm2
AV Area VTI: 0.94 cm2
AV Area mean vel: 0.8 cm2
AV Mean grad: 20.7 mm[Hg]
AV Peak grad: 31.7 mm[Hg]
Ao pk vel: 2.82 m/s
Area-P 1/2: 3.89 cm2
Height: 65 in
S' Lateral: 3.2 cm
Weight: 5120 [oz_av]

## 2023-11-11 LAB — MAGNESIUM
Magnesium: 1.9 mg/dL (ref 1.7–2.4)
Magnesium: 2.3 mg/dL (ref 1.7–2.4)

## 2023-11-11 LAB — GLUCOSE, CAPILLARY
Glucose-Capillary: 171 mg/dL — ABNORMAL HIGH (ref 70–99)
Glucose-Capillary: 174 mg/dL — ABNORMAL HIGH (ref 70–99)
Glucose-Capillary: 168 mg/dL — ABNORMAL HIGH (ref 70–99)

## 2023-11-11 LAB — HEPARIN LEVEL (UNFRACTIONATED): Heparin Unfractionated: 0.63 [IU]/mL (ref 0.30–0.70)

## 2023-11-11 SURGERY — TRACHEOSTOMY
Anesthesia: General

## 2023-11-11 MED ORDER — MAGNESIUM SULFATE 2 GM/50ML IV SOLN
2.0000 g | Freq: Once | INTRAVENOUS | Status: AC
  Administered 2023-11-11: 2 g via INTRAVENOUS
  Filled 2023-11-11: qty 50

## 2023-11-11 MED ORDER — AMIODARONE HCL 200 MG PO TABS
200.0000 mg | ORAL_TABLET | Freq: Every day | ORAL | Status: DC
  Administered 2023-11-11 – 2023-11-15 (×5): 200 mg via ORAL
  Filled 2023-11-11 (×5): qty 1

## 2023-11-11 MED ORDER — METOPROLOL TARTRATE 12.5 MG HALF TABLET
12.5000 mg | ORAL_TABLET | Freq: Four times a day (QID) | ORAL | Status: AC
  Administered 2023-11-11 – 2023-11-13 (×6): 12.5 mg via ORAL
  Filled 2023-11-11 (×8): qty 1

## 2023-11-11 MED ORDER — OSELTAMIVIR PHOSPHATE 75 MG PO CAPS
75.0000 mg | ORAL_CAPSULE | Freq: Two times a day (BID) | ORAL | Status: DC
  Filled 2023-11-11: qty 1

## 2023-11-11 NOTE — Assessment & Plan Note (Addendum)
 Patient with an additional 5.6 L of output the past 24 hours. Continues to be difficult to assess volume status given body habitus.  Lungs are without significant signs of pulmonary edema. - Will dose Lasix  80 mg IV BID, discontinued metolazone  2.5 mg BID  - PM BMP and mag - Continue Strict I/Os - Daily weights - Monitor BMP and Mag daily with diuresis  - Replete lytes as needed  - Repeat BNP improved 111 - Hold  jardiance  25 mg

## 2023-11-11 NOTE — Progress Notes (Addendum)
Daily Progress Note Intern Pager: [phone]  Patient name: Chelsea [name] Medical record number: [id number] Date of birth: [birth year] Age: 58 y.o. Gender: female  Primary Care Provider: Pcp, No Consultants: Cardiology, pulmonology Code Status: Full  Pt Overview and Major Events to Date:  2/2: Admitted 2/5: Patient with atrial flutter, cardiology consulted, started on diltiazem 2/6: Maxed out on diltiazem and amio, added IV dig per cards 2/7: Bolus of amio 150 cc x1, IV dig. 2/8: Patient restarted on dilt drip after recurrence of Afib w/ RVR   2/10: ECHO   Assessment and Plan:  Patient is a 58 year old female presenting with acute hypoxic respiratory failure secondary to pneumonia versus heart failure exacerbation.  She has a longstanding history of hypercarbic respiratory failure due to poor respiratory drive. Assessment & Plan Acute respiratory failure with hypoxia and hypercarbia (HCC) Patient is awake and alert on high flow nasal cannula this morning.  Oxygenating appropriately to 97%.  She denies any chest discomfort or wheezing.  She does have a cough most likely related to her influenza but does not want any symptomatic treatment at this time. Patient has concerns about quality of life if to proceed with trach and associated risks of undergoing procedure. Will consider if trach placement doesn't hamper quality of life.  - Continuous pulse ox - Continue BiPAP while sleeping/naps, wean as tolerated to O2goal of 88-92% - NPO while on BiPAP  -Patient tolerating HFNC while awake may have diet. -Patient has completed her course of CTX  - Continue holding oxycodone and do not administer benzos, given respiratory status and altered mentation/somnolence throughout admission  - AM CBC, BMP  - ENT consult, patient not ready to proceed with trach at this moment  Atrial flutter (HCC) Atrial flutter overnight per EMR rates 60-90s. She denies palpitations this morning.   - Cards:  transition back to PO amiodarone, start metoprolol 12.5 mg q6h, and discontinue diltiazem, not an ablation candidate  - Follow-up  ECHO  - will consider further adjustments after echo results  - Continue home amiodarone 200 mg daily   (HFpEF) heart failure with preserved ejection fraction [facility]) Patient with an additional 5.6 L of output the past 24 hours. Continues to be difficult to assess volume status given body habitus.  Lungs are without significant signs of pulmonary edema. - Will dose Lasix 80 mg IV BID, discontinued metolazone 2.5 mg BID  - PM BMP and mag - Continue Strict I/Os - Daily weights - Monitor BMP and Mag daily with diuresis  - Replete lytes as needed  - Repeat BNP improved 111 - Hold  jardiance 25 mg  H1N1 influenza Patient remains afebrile and without significant symptoms. - Tamiflu (2/6 - ) will complete 5 day course  - AM CBC  - Bcx NGTD   Chronic and Stable Problems:  COPD: Continue home inhalers T2DM: Restarted her diet, will adjust insulin needs as appropriate Anemia: Continue iron supplementation HLD: Continue home atorvastatin Hypothyroidism: Continue home levothyroxine RLS: Continue home ropinerole  GERD: Continue pantoprazole Chronic pain: Consider restarting if mentation improves  FEN/GI: Carb modified PPx: Heparin  Dispo:Return to ALF pending clinical improvement .     Subjective:  On interview, patient slept better overnight.  Denied chest pain or shortness of breath. Asymptomatic with a flutter yesterday evening and didn't recur overnight. Considering trach however wants to understand how it can effect her quality of life.   Objective: Temp:  [97.8 F (36.6 C)-99 F (37.2 C)] 98.4 F (36.9 C) (02/10  0750) Pulse Rate:  [61-107] 61 (02/10 0750) Resp:  [14-25] 18 (02/10 0750) BP: (90-109)/(53-79) 100/68 (02/10 0750) SpO2:  [95 %-98 %] 97 % (02/10 0750) FiO2 (%):  [60 %] 60 % (02/10 0142) Weight:  [145.2 kg] 145.2 kg (02/10 0345) Physical  Exam: General: A&O, no acute distress, Cardiovascular: Per tele: Irregularly irregular, heart rate 90s Respiratory: Respiratory rate WNL, appeared comfortable on HF [place] , no increased work of breathing Abdomen: Nontender Extremities: Moves all 4 spontaneously and appropriately, chronic venous skin changes   Laboratory: Most recent CBC Lab Results  Component Value Date   WBC 8.6 11/11/2023   HGB 12.0 11/11/2023   HCT 39.0 11/11/2023   MCV 87.1 11/11/2023   PLT 279 11/11/2023   Most recent BMP    Latest Ref Rng & Units 11/11/2023    5:00 AM  BMP  Glucose 70 - 99 mg/dL 621   BUN 6 - 20 mg/dL 20   Creatinine 3.08 - 1.00 mg/dL 6.57   Sodium 846 - 962 mmol/L 136   Potassium 3.5 - 5.1 mmol/L 4.1   Chloride 98 - 111 mmol/L 85   CO2 22 - 32 mmol/L 32   Calcium 8.9 - 10.3 mg/dL 9.4     Peterson Ao, MD 11/11/2023, 8:36 AM  PGY-1, [facility] FPTS Intern pager: [phone], text pages welcome Secure chat group [facility] Teaching Service

## 2023-11-11 NOTE — Progress Notes (Signed)
 2D Echocardiogram performed.

## 2023-11-11 NOTE — Consult Note (Signed)
 Reason for Consult: Respiratory failure Referring Physician: Family Medicine  Chelsea [name] is an 58 y.o. female.  HPI: 58 year old female underwent tracheostomy in 2023 by Dr. Luster Salters while in [facility].  She was managed in [facility] hospital thereafter due to tracheostomy bleeding.  At some point, she was decannulated.  She is now hospitalized due to obesity hypoventilation and obstructive sleep apnea and tracheostomy has been recommended again.  Past Medical History:  Diagnosis Date   Acquired hypothyroidism 11/13/2010   Qualifier: Diagnosis of   By: Audery Blazing, MD, Penney Bowling      Acute congestive heart failure (HCC) 03/18/2021   Acute idiopathic gout of left hand 02/16/2019   Acute kidney injury (HCC) 05/29/2023   Acute metabolic encephalopathy 09/29/2023   Acute on chronic diastolic CHF (congestive heart failure), NYHA class 3 (HCC) 09/25/2017   Acute on chronic respiratory failure with hypoxia and hypercapnia (HCC) 04/07/2022   Arthritis    "qwhere" (12/05/2017)   Asthma    Atrial flutter (HCC) 03/09/2021   Bleeding of the respiratory tract 04/07/2022   Bronchitis 05/30/2011   Cervical cancer (HCC) 2006   CHF exacerbation (HCC) 07/30/2022   Chronic heart failure with preserved ejection fraction (HCC) 02/23/2018   Chronic lower back pain    Chronic respiratory failure with hypoxia (HCC) 07/02/2018   ABG 11/2017 7.36/69   Consistent with obesity hypoventilation syndrome   Complication of anesthesia    "I have a hard time waking up from under it" (12/05/2017)   COPD (chronic obstructive pulmonary disease) (HCC) 03/04/2015   Coronary artery disease    Cough productive of clear sputum 06/03/2023   Diarrhea 06/18/2019   DJD (degenerative joint disease) 07/03/2021   DM2 (diabetes mellitus, type 2) (HCC) 11/10/2010   Dyspnea 12/05/2017   Dyspnea on exertion 02/09/2021   Edema 03/22/2011   Essential (primary) hypertension 11/10/2010   Formatting of this note might be  different from the original.  Overview:   Qualifier: Diagnosis of   By: Nadean August      Last Assessment & Plan:   Watch blood pressure off of minoxidil and add medications as needed.   Essential hypertension 11/10/2010   Qualifier: Diagnosis of   By: Nadean August       Gastro-esophageal reflux disease without esophagitis 11/10/2010   GERD 11/10/2010   Qualifier: Diagnosis of   By: Nadean August       Hair loss 09/17/2019   Heart failure (HCC) 04/20/2021   Heart murmur    Hepatitis C    History of cervical cancer 03/22/2011   History of gout X 1   History of tobacco use 04/20/2021   Hx of atrial flutter 03/15/2021   Hyperglycemia due to type 2 diabetes mellitus (HCC) 04/20/2021   Hyperlipidemia    Hypertension associated with diabetes (HCC) 08/25/2018   Irritable bowel syndrome with diarrhea 06/18/2019   Limitation of activity due to disability 09/18/2021   Lymphedema of both lower extremities 03/22/2011   Metabolic alkalosis 03/28/2021   Microalbuminuria 04/20/2021   Migraine    "monthly" (12/05/2017)   Mild tricuspid regurgitation 04/02/2021   Mitral valve sclerosis 04/02/2021   Mixed hyperlipidemia 04/18/2020   Moderate aortic stenosis 04/20/2021   Mononeuropathy of lower extremity 04/09/2011   Formatting of this note might be different from the original.  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  Severe, corrected by CPAP 18 with C-flex of 3  2/13 bipap 20/14 sm ff mask h/h biflex+3cm  Formatting of this note might be different from the original.  Sleep study 06/2020: IMPRESSIONS  - Moderate obstructive sleep apnea occurred during this study (AHI = 17.7/h), mostly REM related  - No significant central sleep apnea occurred during   On home oxygen  therapy    "2L all the time" (12/05/2017)   On supplemental oxygen  by nasal cannula 04/20/2021   Onychomycosis 06/18/2019   OSA treated with BiPAP    "have CPAP at home too; wearing BiPAP right now" (12/05/2017)   Osteoarthritis 11/28/2010   Qualifier: Diagnosis of   By: Earle Glatter, Scott       Other long term (current) drug therapy 02/12/2022   Paroxysmal atrial fibrillation (HCC) 09/04/2021   Physical debility 05/10/2018   Formatting of this note might be different from the original.  Due to morbid obesity   Physical deconditioning 04/24/2021   Pneumonia    "several times" (12/05/2017)   Pneumonia due to gram-positive bacteria 08/24/2017   Pressure injury of both heels, unstageable (HCC) 07/03/2021   Pulmonary edema, acute (HCC) 02/21/2022   Renal insufficiency 04/20/2021   Restless leg syndrome 03/22/2021   Restrictive lung disease secondary to obesity 01/28/2022   S/P hysterectomy 02/19/2021   Tinea pedis of both feet 11/12/2018   Unspecified atrial flutter (HCC) 02/12/2022   Upper GI bleed 07/18/2022   Venous insufficiency of both lower extremities 07/02/2021   Weakness 03/20/2021   Weight loss 03/15/2021    Past Surgical History:  Procedure Laterality Date   ABDOMINAL SURGERY  2006 X 2   "TAH incision was infected"   BIOPSY  07/21/2022   Procedure: BIOPSY;  Surgeon: Felecia Hopper, MD;  Location: [facility];  Service: Gastroenterology;;   CHOLECYSTECTOMY OPEN     ESOPHAGOGASTRODUODENOSCOPY N/A 07/21/2022   Procedure: ESOPHAGOGASTRODUODENOSCOPY (EGD);  Surgeon: Felecia Hopper, MD;  Location: [facility];  Service: Gastroenterology;  Laterality: N/A;   TONSILLECTOMY     TOTAL ABDOMINAL HYSTERECTOMY  2006   TRACHEOSTOMY REVISION N/A 04/09/2022   Procedure: CONTROL OF BLEEDING,TRACHEOSTOMY SITE;  Surgeon: Virgina Grills, MD;  Location: [facility] OR;  Service: ENT;  Laterality: N/A;    Family History  Problem Relation Age of Onset   Heart disease Father     Social History:  reports that she has never smoked. She has never used smokeless tobacco. She reports that she does not currently use alcohol. She reports that she does not use drugs.  Allergies:  Allergies  Allergen Reactions   Iodinated Contrast Media Anaphylaxis   Penicillins Anaphylaxis    Patient tolerates cefepime  (08/2017)   Insulin  Lispro Other (See Comments)    Adder per MAR from SNF   Minoxidil Hives    Medications: I have reviewed the patient's current medications.  Results for orders placed or performed during the hospital encounter of 11/03/23 (from the past 48 hours)  Heparin  level (unfractionated)  Status: Abnormal   Collection Time: 11/09/23  9:08 AM  Result Value Ref Range   Heparin  Unfractionated 0.75 (H) 0.30 - 0.70 IU/mL    Comment: (NOTE) The clinical reportable range upper limit is being lowered to >1.10 to align with the FDA approved guidance for the current laboratory assay.  If heparin  results are below expected values, and patient dosage has  been confirmed, suggest follow up testing of antithrombin III levels. Performed at [facility], [address] [place], [place] [address]   APTT     Status: Abnormal   Collection Time: 11/09/23  9:08 AM  Result Value Ref Range   aPTT 100 (H) 24 - 36 seconds    Comment:        IF BASELINE aPTT IS ELEVATED, SUGGEST PATIENT RISK ASSESSMENT BE USED TO DETERMINE APPROPRIATE ANTICOAGULANT THERAPY. Performed at [facility], [address], [place], [place] [address]   Glucose, capillary     Status: Abnormal    Collection Time: 11/09/23  1:26 PM  Result Value Ref Range   Glucose-Capillary 208 (H) 70 - 99 mg/dL    Comment: Glucose reference range applies only to samples taken after fasting for at least 8 hours.  Heparin  level (unfractionated)     Status: Abnormal   Collection Time: 11/09/23  3:55 PM  Result Value Ref Range   Heparin  Unfractionated 0.29 (L) 0.30 - 0.70 IU/mL    Comment: (NOTE) The clinical reportable range upper limit is being lowered to >1.10 to align with the FDA approved guidance for the current laboratory assay.  If heparin  results are below expected values, and patient dosage has  been confirmed, suggest follow up testing of antithrombin III levels. Performed at [facility], [address] [place], [place] [address]   APTT     Status: Abnormal   Collection Time: 11/09/23  3:55 PM  Result Value Ref Range   aPTT 47 (H) 24 - 36 seconds    Comment:        IF BASELINE aPTT IS ELEVATED, SUGGEST PATIENT RISK ASSESSMENT BE USED TO DETERMINE APPROPRIATE ANTICOAGULANT THERAPY. Performed at [facility], [address] [place], [place] [address]   Glucose, capillary     Status: Abnormal   Collection Time: 11/09/23  3:59 PM  Result Value Ref Range   Glucose-Capillary 201 (H) 70 - 99 mg/dL    Comment: Glucose reference range applies only to samples taken after fasting for at least 8 hours.  CBC     Status: Abnormal   Collection Time: 11/10/23  5:40 AM  Result Value Ref Range   WBC 7.7 4.0 - 10.5 K/uL   RBC 4.31 3.87 - 5.11 MIL/uL   Hemoglobin 11.5 (L) 12.0 - 15.0 g/dL   HCT 29.5 62.1 - 30.8 %   MCV 87.0 80.0 - 100.0 fL   MCH 26.7 26.0 - 34.0 pg   MCHC 30.7 30.0 - 36.0 g/dL   RDW 65.7 84.6 - 96.2 %   Platelets 264 150 - 400 K/uL   nRBC 0.0 0.0 - 0.2 %    Comment: Performed at [facility], [address] [place], [place] [address]  Heparin  level (unfractionated)     Status: None   Collection Time: 11/10/23  5:40 AM  Result Value Ref Range    Heparin  Unfractionated 0.43 0.30 - 0.70 IU/mL    Comment: (NOTE) The clinical reportable range upper limit is being lowered to >1.10 to  align with the FDA approved guidance for the current laboratory assay.  If heparin  results are below expected values, and patient dosage has  been confirmed, suggest follow up testing of antithrombin III levels. Performed at [facility], [address], [place], [place] [address]   Basic metabolic panel     Status: Abnormal   Collection Time: 11/10/23  5:40 AM  Result Value Ref Range   Sodium 136 135 - 145 mmol/L   Potassium 3.8 3.5 - 5.1 mmol/L   Chloride 87 (L) 98 - 111 mmol/L   CO2 37 (H) 22 - 32 mmol/L   Glucose, Bld 191 (H) 70 - 99 mg/dL    Comment: Glucose reference range applies only to samples taken after fasting for at least 8 hours.   BUN 19 6 - 20 mg/dL   Creatinine, Ser 4.27 0.44 - 1.00 mg/dL   Calcium  8.6 (L) 8.9 - 10.3 mg/dL   GFR, Estimated >06 >23 mL/min    Comment: (NOTE) Calculated using the CKD-EPI Creatinine Equation (2021)    Anion gap 12 5 - 15    Comment: Performed at [facility], [address] [place], [place] [address]  Magnesium      Status: None   Collection Time: 11/10/23  5:40 AM  Result Value Ref Range   Magnesium  1.7 1.7 - 2.4 mg/dL    Comment: Performed at [facility], [address] [place], [place] [address]  Brain natriuretic peptide     Status: Abnormal   Collection Time: 11/10/23  5:40 AM  Result Value Ref Range   B Natriuretic Peptide 111.6 (H) 0.0 - 100.0 pg/mL    Comment: Performed at [facility], [address], [place], [place] [address]  Glucose, capillary     Status: Abnormal   Collection Time: 11/10/23  6:57 AM  Result Value Ref Range   Glucose-Capillary 152 (H) 70 - 99 mg/dL    Comment: Glucose reference range applies only to samples taken after fasting for at least 8 hours.  Glucose, capillary     Status: Abnormal   Collection Time: 11/10/23  8:21 AM  Result  Value Ref Range   Glucose-Capillary 154 (H) 70 - 99 mg/dL    Comment: Glucose reference range applies only to samples taken after fasting for at least 8 hours.  Glucose, capillary     Status: Abnormal   Collection Time: 11/10/23 11:51 AM  Result Value Ref Range   Glucose-Capillary 182 (H) 70 - 99 mg/dL    Comment: Glucose reference range applies only to samples taken after fasting for at least 8 hours.  Basic metabolic panel     Status: Abnormal   Collection Time: 11/10/23  3:24 PM  Result Value Ref Range   Sodium 135 135 - 145 mmol/L   Potassium 3.9 3.5 - 5.1 mmol/L   Chloride 85 (L) 98 - 111 mmol/L   CO2 36 (H) 22 - 32 mmol/L   Glucose, Bld 236 (H) 70 - 99 mg/dL    Comment: Glucose reference range applies only to samples taken after fasting for at least 8 hours.   BUN 21 (H) 6 - 20 mg/dL   Creatinine, Ser 7.10 0.44 - 1.00 mg/dL   Calcium  9.4 8.9 - 10.3 mg/dL   GFR, Estimated >62 >69 mL/min    Comment: (NOTE) Calculated using the CKD-EPI Creatinine Equation (2021)    Anion gap 14 5 - 15    Comment: Performed at [facility], [address], [place], [place] [address]  Magnesium      Status: None   Collection Time: 11/10/23  3:24 PM  Result Value Ref Range   Magnesium  2.1 1.7 - 2.4 mg/dL    Comment: Performed at [facility], [address], [place], [place] [address]  Glucose, capillary     Status: Abnormal   Collection Time: 11/10/23  4:54 PM  Result Value Ref Range   Glucose-Capillary 241 (H) 70 - 99 mg/dL    Comment: Glucose reference range applies only to samples taken after fasting for at least 8 hours.  CBC     Status: None   Collection Time: 11/11/23  5:00 AM  Result Value Ref Range   WBC 8.6 4.0 - 10.5 K/uL   RBC 4.48 3.87 - 5.11 MIL/uL   Hemoglobin 12.0 12.0 - 15.0 g/dL   HCT 82.9 56.2 - 13.0 %   MCV 87.1 80.0 - 100.0 fL   MCH 26.8 26.0 - 34.0 pg   MCHC 30.8 30.0 - 36.0 g/dL   RDW 86.5 78.4 - 69.6 %   Platelets 279 150 - 400 K/uL   nRBC 0.0 0.0 -  0.2 %    Comment: Performed at [facility], [address] [place], [place] [address]  Heparin  level (unfractionated)     Status: None   Collection Time: 11/11/23  5:00 AM  Result Value Ref Range   Heparin  Unfractionated 0.63 0.30 - 0.70 IU/mL    Comment: (NOTE) The clinical reportable range upper limit is being lowered to >1.10 to align with the FDA approved guidance for the current laboratory assay.  If heparin  results are below expected values, and patient dosage has  been confirmed, suggest follow up testing of antithrombin III levels. Performed at [facility], [address], [place], [place] [address]   Basic metabolic panel     Status: Abnormal   Collection Time: 11/11/23  5:00 AM  Result Value Ref Range   Sodium 136 135 - 145 mmol/L   Potassium 4.1 3.5 - 5.1 mmol/L   Chloride 85 (L) 98 - 111 mmol/L   CO2 32 22 - 32 mmol/L   Glucose, Bld 184 (H) 70 - 99 mg/dL    Comment: Glucose reference range applies only to samples taken after fasting for at least 8 hours.   BUN 20 6 - 20 mg/dL   Creatinine, Ser 4.01 (H) 0.44 - 1.00 mg/dL   Calcium  9.4 8.9 - 10.3 mg/dL   GFR, Estimated >02 >72 mL/min    Comment: (NOTE) Calculated using the CKD-EPI Creatinine Equation (2021)    Anion gap 19 (H) 5 - 15    Comment: Performed at [facility], [address], [place], [place] [address]  Magnesium      Status: None   Collection Time: 11/11/23  5:00 AM  Result Value Ref Range   Magnesium  1.9 1.7 - 2.4 mg/dL    Comment: Performed at [facility], [address] [place], [place] [address]    No results found.  Review of Systems  All other systems reviewed and are negative.  Blood pressure 100/68, pulse 61, temperature 98.4 F (36.9 C), temperature source Oral, resp. rate 18, height 5\' 5"  (1.651 m), weight (!) 145.2 kg, SpO2 97%. Physical Exam Constitutional:      Appearance: Normal appearance. She is obese.  HENT:     Head: Normocephalic and atraumatic.      Right  Ear: External ear normal.     Left Ear: External ear normal.     Nose: Nose normal.     Mouth/Throat:     Mouth: Mucous membranes are moist.     Pharynx: Oropharynx is clear.  Eyes:     Extraocular Movements: Extraocular movements intact.     Pupils: Pupils are equal, round, and reactive to light.  Neck:     Comments: Tracheostomy scar. Cardiovascular:     Rate and Rhythm: Normal rate.  Pulmonary:     Effort: Pulmonary effort is normal.  Skin:    General: Skin is warm and dry.  Neurological:     General: No focal deficit present.     Mental Status: She is alert and oriented to person, place, and time.  Psychiatric:        Mood and Affect: Mood normal.        Behavior: Behavior normal.        Thought Content: Thought content normal.        Judgment: Judgment normal.     Assessment/Plan: Obesity hypoventilation and obstructive sleep apnea  I discussed the recommendation for revision tracheostomy and answered some of her questions.  I deferred ventilator management questions for her to ask her pulmonologist.  She is not ready to proceed with tracheostomy today.  Please call me back if she decides to proceed.  Virgina Grills 11/11/2023, 8:01 AM

## 2023-11-11 NOTE — Plan of Care (Signed)

## 2023-11-11 NOTE — Assessment & Plan Note (Addendum)
 Atrial flutter overnight per EMR rates 60-90s. She denies palpitations this morning.   - Cards: transition back to PO amiodarone , start metoprolol  12.5 mg q6h, and discontinue diltiazem , not an ablation candidate  - Follow-up  ECHO  - will consider further adjustments after echo results  - Continue home amiodarone  200 mg daily

## 2023-11-11 NOTE — Assessment & Plan Note (Addendum)
 Patient is awake and alert on high flow nasal cannula this morning.  Oxygenating appropriately to 97%.  She denies any chest discomfort or wheezing.  She does have a cough most likely related to her influenza but does not want any symptomatic treatment at this time. Patient has concerns about quality of life if to proceed with trach and associated risks of undergoing procedure. Will consider if trach placement doesn't hamper quality of life.  - Continuous pulse ox - Continue BiPAP while sleeping/naps, wean as tolerated to O2goal of 88-92% - NPO while on BiPAP  -Patient tolerating HFNC while awake may have diet. -Patient has completed her course of CTX  - Continue holding oxycodone  and do not administer benzos, given respiratory status and altered mentation/somnolence throughout admission  - AM CBC, BMP  - ENT consult, patient not ready to proceed with trach at this moment

## 2023-11-11 NOTE — Progress Notes (Addendum)
 Patient Name: Chelsea [name] Date of Encounter: 11/11/2023 [facility] Cardiologist: Dorothye Gathers, MD   Interval Summary  .    Patient reports improved respiratory status today. Expresses concern over sensation of rapid HR overnight (though telemetry does not reflect this). She has many questions about possible trach.   Vital Signs .    Vitals:   11/11/23 0019 11/11/23 0345 11/11/23 0400 11/11/23 0750  BP:  102/79  100/68  Pulse: 85 75  61  Resp:  19  18  Temp:  97.9 F (36.6 C)  98.4 F (36.9 C)  TempSrc:  Axillary  Oral  SpO2:  98% 96% 97%  Weight:  (!) 145.2 kg    Height:        Intake/Output Summary (Last 24 hours) at 11/11/2023 0857 Last data filed at 11/11/2023 0753 Gross per 24 hour  Intake 2073.73 ml  Output 6210 ml  Net -4136.27 ml      11/11/2023    3:45 AM 11/10/2023    4:58 AM 11/09/2023    6:03 AM  Last 3 Weights  Weight (lbs) 320 lb 323 lb 3.1 oz 326 lb 11.6 oz  Weight (kg) 145.151 kg 146.6 kg 148.2 kg      Telemetry/ECG    Atrial flutter with variable conduction. Ventricular rates 60s-80s overnight - Personally Reviewed  Physical Exam .   GEN: No acute distress.   Neck: No JVD Cardiac: slightly irregular, 3/6 systolic murmur upper sternal border Respiratory: Clear to auscultation bilaterally. GI: Soft, nontender, non-distended  MS: Lower extremities wrapped in compression bandage  Assessment & Plan .    Chelsea [name] is a 58 y.o. female with a history of chronic HFpEF, paroxysmal atrial fibrillation/ flutter on Eliquis , moderate aortic stenosis, COPD with chronic respiratory respiratory failure on home O2, obstructive sleep apnea/ obesity hypoventilation syndrome on BiPAP,  hypertension, hyperlipidemia, type 2 diabetes mellitus, GERD, and morbid obesity with BMI >50  who is being seen for the evaluation of atrial flutter at the request of Dr. Bevin Bucks. Patient admitted after being found with low O2 saturation at her skilled nursing  facility. Respiratory viral panel positive for influenza.  Atrial flutter with RVR Secondary hypercoagulable state  Patient with atrial flutter and RVR in the setting of influenza A infection. Currently on IV Amiodarone  and IV diltiazem . Was briefly off Diltiazem  on 2/8 but had recurrent flutter with RVR. She has been maintained on Heparin , not yet transitioned back to Eliquis  given possible trach. Rates much better controlled in the last 24 hours, 60s-80s.  Transition to back to home oral amiodarone  200mg  daily. Will start Metoprolol , may be able to discontinue Diltiazem .  Not an ablation candidate.  Given ongoing trach discussion, defer timing of transition back to Eliquis  to primary team.  Would need TEE prior to DCCV given missed doses of Eliquis . Would be high risk for TEE given body habitus.   Acute on chronic diastolic CHF LE edema  LVEF 60-65% with moderately dilated left atrium by 06/01/23 TTE. BNP this admission 411.3->111.6. Repeat echocardiogram pending.   Volume status difficult to assess but appears well compensated today. BNP improved. Net negative 10.9L. Continue Lasix  IV 80mg  BID. With creatinine slightly elevated this AM, 0.86->1.01, would hold Metolazone . Monitor renal function closely with diuresis and consider transition back to oral loop agent. Home Jardiance  held this admission, would consider resuming.   Acute on chronic hypoxic respiratory failure with influenza COPD OSA Obesity OHS  Respiratory status improved today, good air  movement on lung auscultation. Bilateral rhonchi noted without pulmonary crackles/rales.  Management per primary team with BiPAP upon sleeping.   For questions or updates, please contact [facility] Please consult [email or web address] for contact info under        Signed, Leala Prince, PA-C

## 2023-11-11 NOTE — Assessment & Plan Note (Addendum)
 Patient remains afebrile and without significant symptoms. - Tamiflu  (2/6 - ) will complete 5 day course  - AM CBC  - Bcx NGTD   Chronic and Stable Problems:  COPD: Continue home inhalers T2DM: Restarted her diet, will adjust insulin  needs as appropriate Anemia: Continue iron  supplementation HLD: Continue home atorvastatin  Hypothyroidism: Continue home levothyroxine  RLS: Continue home ropinerole  GERD: Continue pantoprazole  Chronic pain: Consider restarting if mentation improves  FEN/GI: Carb modified PPx: Heparin   Dispo:Return to ALF pending clinical improvement .

## 2023-11-11 NOTE — Inpatient Diabetes Management (Signed)
 Inpatient Diabetes Program Recommendations  AACE/ADA: New Consensus Statement on Inpatient Glycemic Control (2015)  Target Ranges:  Prepandial:   less than 140 mg/dL      Peak postprandial:   less than 180 mg/dL (1-2 hours)      Critically ill patients:  140 - 180 mg/dL   Lab Results  Component Value Date   GLUCAP 171 (H) 11/11/2023   HGBA1C 9.3 (H) 08/16/2023    Review of Glycemic Control  Latest Reference Range & Units 11/10/23 11:51 11/10/23 16:54 11/11/23 08:22  Glucose-Capillary 70 - 99 mg/dL 098 (H) 119 (H) 147 (H)   Diabetes history: DM  Outpatient Diabetes medications:  Jardiance  25 mg daily Lantus  35 units daily Trulicity 1.5 mg weekly Current orders for Inpatient glycemic control:  Novolog  0-15 units tid with meals   Inpatient Diabetes Program Recommendations:    Please consider restarting Semglee  16 units daily (1/2 of home basal insulin ).   Thanks,  Josefa Ni, RN, BC-ADM Inpatient Diabetes Coordinator Pager [phone]  (8a-5p)

## 2023-11-11 NOTE — Progress Notes (Signed)
 PHARMACY - ANTICOAGULATION CONSULT NOTE  Pharmacy Consult for Heparin  Indication: atrial flutter  Allergies  Allergen Reactions   Iodinated Contrast Media Anaphylaxis   Penicillins Anaphylaxis    Patient tolerates cefepime  (08/2017)   Insulin  Lispro Other (See Comments)    Adder per [facility] from SNF   Minoxidil Hives    Patient Measurements: Height: 5\' 5"  (165.1 cm) Weight: (!) 145.2 kg (320 lb) IBW/kg (Calculated) : 57 Heparin  Dosing Weight: 95 kg  Vital Signs: Temp: 98.4 F (36.9 C) (02/10 0750) Temp Source: Oral (02/10 0750) BP: 100/68 (02/10 0750) Pulse Rate: 61 (02/10 0750)  Labs: Recent Labs    11/08/23 1600 11/08/23 2300 11/09/23 0530 11/09/23 0908 11/09/23 0908 11/09/23 1555 11/10/23 0540 11/10/23 1524 11/11/23 0500  HGB   < >  --  10.7*  --   --   --  11.5*  --  12.0  HCT  --   --  35.9*  --   --   --  37.5  --  39.0  PLT  --   --  252  --   --   --  264  --  279  APTT  --  58*  --  100*  --  47*  --   --   --   HEPARINUNFRC  --   --   --  0.75*   < > 0.29* 0.43  --  0.63  CREATININE  --   --  0.83  --   --   --  0.87 0.86 1.01*   < > = values in this interval not displayed.    Estimated Creatinine Clearance: 87.4 mL/min (A) (by C-G formula based on SCr of 1.01 mg/dL (H)).   Assessment: 58 y.o. female with Aflutter, apixaban  on hold, for heparin  pending decision regarding possible trach for hypercarbia.  Last apixaban  dose 2/4 PM.    2/10 AM: heparin  level 0.63, therapeutic on 2350 units/hr. No bleeding noted. CBC stable.   Goal of Therapy:  Heparin  level 0.3-0.7 units/ml Monitor platelets by anticoagulation protocol: Yes   Plan:  Continue heparin  drip at 2350 units/hr Heparin  level and CBC daily Monitor for s/sx of bleeding F/u ability to resume apixaban   Thank you for involving pharmacy in this patient's care.  Caroline Cinnamon, PharmD, BCPS Clinical Pharmacist Clinical phone for 11/11/2023 is [phone] 11/11/2023 9:10 AM

## 2023-11-11 NOTE — Hospital Course (Addendum)
 Chelsea [name] is a 58 y.o. female who was admitted to the [facility] Medicine Teaching Service at [facility] for acute on chronic respiratory failure with hypoxia and hypercarbia. Hospital course is outlined below by problem.   Acute Respiratory Failure with hypoxia and hypercarbia Patient presented to the hospital from her ALF after being found off of her oxygen  and was reported to be hypoxic to the 70s.  She was placed on nonrebreather in the emergency department with improvement, and transition to high flow nasal cannula.  VBGs showed pCO2 of 65, white count elevated to 13.4 and a normal lactic acid  level.  Chest x-ray showed heterogenous bilateral opacities suspicious for pneumonia, patient was empirically treated with ceftriaxone  and azithromycin , Solu-Medrol , mag sulfate and given 40 mg IV Lasix  given her history of HFpEF.  On admission she was started on BiPAP for goal oxygen  saturation of 88 to 92%.  Given her somnolence any CNS acting medications were withheld.  Ceftriaxone  and azithromycin  were continued she completed a 5 and 3-day course respectively.  Her mentation improved on BiPAP and patient was counseled that she would need to remain on BiPAP while asleep to avoid further strain on her body.  Strep urine antigen, Legionella and MRSA swabs were elected and were all negative.  Patient improved slowly over the course of her hospitalization and was generally compliant with BiPAP.  Did fever overnight on 2/5 and antibiotics were temporarily broadened to vancomycin  and cefepime , however patient had RVP positive for H1 N1 and vancomycin  and cefepime  were discontinued.  Given patient's longstanding history of recurrent respiratory failure with hypoxia and hypercarbia object of tracheostomy was brought up again and patient was initially willing to discuss with pulmonology and ENT, however after discussion with her spouse they decided against it at this time and would consider further after discharge.    HFpEF Patient was continued on her home medications and dosed with IV Lasix  40 mg daily initially, however and her worsening respiratory status, this was advanced to 80 mg of IV Lasix  followed by 80 mg of IV Lasix  to twice daily.  Home metolazone  was added back on briefly, however was discontinued due to mild bump in creatinine. Patient resumed her home regimen of Torsemide  60 mg BID prior to discharge. Home Jardiance  25 mg daily was restarted. Overall patient was diuresed ~18 L over the course of her hospitalization.  Atrial Flutter with RVR During admission patient was found to have worsening respiratory status and increased oxygen  requirement, was found to be in atrial flutter with RVR.  Neurology was consulted and the patient was started on a diltiazem  drip.  At that time her home amiodarone  200 mg was continued, but was later discontinued per cardiology in favor of initiating amiodarone  drip.  Patient remained tachycardic to the 130s to 140s despite additional Amio bolus, and patient was given 0.25 mg IV digoxin .  Rate reduced to 100s to 110s, patient remained in a flutter with RVR.  Patient's rate better controlled on 2/8, and dilt drip was weaned accordingly. Patient was not able to tolerate discontinuation of diltiazem  drip and it was restarted on 2/9. ECHO was ordered given concern for heart strain and showed Grade II diastolic dysfunction. EF was 60-65%.   Patients medication at discharge was  Amiodarone  200 mg daily and Toprol  XL 50 mg daily   H1N1 Influenza Diagnosed on 2/6 and treated with 5 day course of tamiflu  and supportive care.   Other conditions that were chronic and stable managed with home  medications or appropriate formulary alternatives.  Issues for follow up: Recheck BMP outpatient, monitoring Creatinine and Potassium  Recommend hospital follow-up with pulmonology Consider ENT appointment, if she wants to further consider tracheostomy placement   Recommend hospital  follow-up with cardiology

## 2023-11-11 NOTE — TOC Progression Note (Signed)
 Transition of Care [facility]) - Progression Note    Patient Details  Name: Chelsea [name] MRN: [id number] Date of Birth: 1966-08-12  Transition of Care [facility]) CM/SW Contact  Carmon Christen, LCSWA Phone Number: 11/11/2023, 12:36 PM  Clinical Narrative:      CSW following. Plan for patient to return to [facility] when medically ready.   Expected Discharge Plan: Skilled Nursing Facility Barriers to Discharge: Continued Medical Work up  Expected Discharge Plan and Services In-house Referral: Clinical Social Work     Living arrangements for the past 2 months: Skilled Nursing Facility                                       Social Determinants of Health (SDOH) Interventions SDOH Screenings   Food Insecurity: No Food Insecurity (11/04/2023)  Housing: Low Risk  (11/04/2023)  Transportation Needs: No Transportation Needs (11/04/2023)  Utilities: Not At Risk (11/04/2023)  Alcohol Screen: Low Risk  (07/05/2022)  Financial Resource Strain: Low Risk  (07/05/2022)  Physical Activity: Insufficiently Active (03/27/2021)   Received from [facility], [facility]  Social Connections: Unknown (02/04/2022)   Received from [facility], [facility]  Stress: No Stress Concern Present (09/15/2021)   Received from [facility], [facility]  Tobacco Use: Low Risk  (11/03/2023)    Readmission Risk Interventions    07/23/2022    2:19 PM 04/09/2022    4:17 PM  Readmission Risk Prevention Plan  Transportation Screening Complete Complete  PCP or Specialist Appt within 3-5 Days  Complete  HRI or Home Care Consult  Complete  Social Work Consult for Recovery Care Planning/Counseling  Complete  Palliative Care Screening  Not Applicable  Medication Review [facility]) Complete Referral to Pharmacy  PCP or Specialist appointment within 3-5 days of discharge Complete   HRI or Home Care Consult Complete   SW Recovery Care/Counseling Consult Complete   Palliative Care Screening Not Applicable    Skilled Nursing Facility Complete

## 2023-11-12 DIAGNOSIS — I4892 Unspecified atrial flutter: Secondary | ICD-10-CM | POA: Diagnosis not present

## 2023-11-12 DIAGNOSIS — J9622 Acute and chronic respiratory failure with hypercapnia: Secondary | ICD-10-CM | POA: Diagnosis not present

## 2023-11-12 DIAGNOSIS — J9601 Acute respiratory failure with hypoxia: Secondary | ICD-10-CM | POA: Diagnosis not present

## 2023-11-12 DIAGNOSIS — J9621 Acute and chronic respiratory failure with hypoxia: Secondary | ICD-10-CM | POA: Diagnosis not present

## 2023-11-12 DIAGNOSIS — J101 Influenza due to other identified influenza virus with other respiratory manifestations: Secondary | ICD-10-CM | POA: Diagnosis not present

## 2023-11-12 DIAGNOSIS — I5033 Acute on chronic diastolic (congestive) heart failure: Secondary | ICD-10-CM | POA: Diagnosis not present

## 2023-11-12 LAB — CBC
HCT: 37.2 % (ref 36.0–46.0)
Hemoglobin: 11.3 g/dL — ABNORMAL LOW (ref 12.0–15.0)
MCH: 26.3 pg (ref 26.0–34.0)
MCHC: 30.4 g/dL (ref 30.0–36.0)
MCV: 86.7 fL (ref 80.0–100.0)
Platelets: 276 10*3/uL (ref 150–400)
RBC: 4.29 MIL/uL (ref 3.87–5.11)
RDW: 15.5 % (ref 11.5–15.5)
WBC: 8.8 10*3/uL (ref 4.0–10.5)
nRBC: 0 % (ref 0.0–0.2)

## 2023-11-12 LAB — BASIC METABOLIC PANEL
Anion gap: 18 — ABNORMAL HIGH (ref 5–15)
BUN: 27 mg/dL — ABNORMAL HIGH (ref 6–20)
CO2: 31 mmol/L (ref 22–32)
Calcium: 9.5 mg/dL (ref 8.9–10.3)
Chloride: 86 mmol/L — ABNORMAL LOW (ref 98–111)
Creatinine, Ser: 1.15 mg/dL — ABNORMAL HIGH (ref 0.44–1.00)
GFR, Estimated: 55 mL/min — ABNORMAL LOW (ref >60)
Glucose, Bld: 187 mg/dL — ABNORMAL HIGH (ref 70–99)
Potassium: 4.3 mmol/L (ref 3.5–5.1)
Sodium: 135 mmol/L (ref 135–145)

## 2023-11-12 LAB — GLUCOSE, CAPILLARY
Glucose-Capillary: 183 mg/dL — ABNORMAL HIGH (ref 70–99)
Glucose-Capillary: 172 mg/dL — ABNORMAL HIGH (ref 70–99)
Glucose-Capillary: 188 mg/dL — ABNORMAL HIGH (ref 70–99)
Glucose-Capillary: 163 mg/dL — ABNORMAL HIGH (ref 70–99)

## 2023-11-12 LAB — HEPARIN LEVEL (UNFRACTIONATED): Heparin Unfractionated: 0.46 [IU]/mL (ref 0.30–0.70)

## 2023-11-12 LAB — CULTURE, BLOOD (ROUTINE X 2)
Culture: NO GROWTH
Culture: NO GROWTH

## 2023-11-12 LAB — MAGNESIUM: Magnesium: 2.2 mg/dL (ref 1.7–2.4)

## 2023-11-12 MED ORDER — TORSEMIDE 20 MG PO TABS
60.0000 mg | ORAL_TABLET | Freq: Two times a day (BID) | ORAL | Status: DC
  Administered 2023-11-12 – 2023-11-14 (×5): 60 mg via ORAL
  Filled 2023-11-12 (×5): qty 3

## 2023-11-12 MED ORDER — EMPAGLIFLOZIN 25 MG PO TABS
25.0000 mg | ORAL_TABLET | Freq: Every day | ORAL | Status: DC
  Administered 2023-11-12 – 2023-11-15 (×4): 25 mg via ORAL
  Filled 2023-11-12 (×4): qty 1

## 2023-11-12 MED ORDER — METOPROLOL SUCCINATE ER 50 MG PO TB24
50.0000 mg | ORAL_TABLET | Freq: Every day | ORAL | Status: DC
  Administered 2023-11-13 – 2023-11-15 (×3): 50 mg via ORAL
  Filled 2023-11-12 (×3): qty 1

## 2023-11-12 MED ORDER — OSELTAMIVIR PHOSPHATE 75 MG PO CAPS
75.0000 mg | ORAL_CAPSULE | Freq: Once | ORAL | Status: AC
  Administered 2023-11-12: 75 mg via ORAL
  Filled 2023-11-12: qty 1

## 2023-11-12 NOTE — Progress Notes (Signed)
Notified Dr Lincoln Brigham that Cardizem gtt stopped r/t heart rate 51 and blood pressure 101/51 (66).

## 2023-11-12 NOTE — Progress Notes (Signed)
Orthopedic Tech Progress Note Patient Details:  Chelsea [name] 1966/08/02 [id number]  [name] to apply a fresh pair of UNNA BOOTS patient still has on pair ill remove once I go back patient was having breakfast at the moment.will return around 10/11this morning   Patient ID: Chelsea [name], female   DOB: 03-21-1966, 58 y.o.   MRN: [id number]  Chelsea [name] 11/12/2023, 9:08 AM

## 2023-11-12 NOTE — Progress Notes (Signed)
   Patient Name: Chelsea [name] Date of Encounter: 11/12/2023 [facility] Cardiologist: Donato Schultz, MD   Interval Summary  .    Patient reports feeling much better today from a cardiac perspective now that she is back in NSR. She has multiple questions/concerns today about possible trach placement for nighttime management of hypercarbic respiratory failure.   Vital Signs .    Vitals:   11/12/23 0500 11/12/23 0539 11/12/23 0732 11/12/23 0801  BP:  (!) 101/51 97/80   Pulse:  (!) 54 (!) 53 60  Resp:  (!) 25 16 17   Temp:   97.8 F (36.6 C)   TempSrc:   Oral   SpO2:   97% 97%  Weight: (!) 143.5 kg     Height:        Intake/Output Summary (Last 24 hours) at 11/12/2023 0850 Last data filed at 11/12/2023 4540 Gross per 24 hour  Intake 933.99 ml  Output 3000 ml  Net -2066.01 ml      11/12/2023    5:00 AM 11/11/2023    3:45 AM 11/10/2023    4:58 AM  Last 3 Weights  Weight (lbs) 316 lb 5.8 oz 320 lb 323 lb 3.1 oz  Weight (kg) 143.5 kg 145.151 kg 146.6 kg      Telemetry/ECG    Sinus rhythm/sinus bradycardia. Patient with spontaneous conversion to NSR on the morning of 2/10 - Personally Reviewed  Physical Exam .   GEN: No acute distress.   Neck: Unable to assess JVD due to body habitus Cardiac: RRR, no murmurs, rubs, or gallops.  Respiratory: generalized wheezing in all fields GI: Soft, nontender, non-distended  MS: Bilateral lower extremities wrapped with compression bandage.  Assessment & Plan .     Chelsea [name] is a 58 y.o. female with a history of chronic HFpEF, paroxysmal atrial fibrillation/ flutter on Eliquis, moderate aortic stenosis, COPD with chronic respiratory respiratory failure on home O2, obstructive sleep apnea/ obesity hypoventilation syndrome on BiPAP,  hypertension, hyperlipidemia, type 2 diabetes mellitus, GERD, and morbid obesity with BMI >50  who is being seen for the evaluation of atrial flutter at the request of Dr. Manson Passey. Patient  admitted after being found with low O2 saturation at her skilled nursing facility. Respiratory viral panel positive for influenza.   Atrial flutter with RVR Secondary hypercoagulable state Patient with atrial flutter/RVR in the setting of Influenza A infection. She was treated with IV amiodarone and IV diltiazem. Yesterday was transitioned back to home PO Amiodarone and started on Metoprolol. Patient now back in NSR, spontaneous conversion on morning of 2/10.  Continue home Amiodarone 200mg  daily Will consolidate Lopressor to Toprol XL on 2/12, 50mg  daily dose Patient remains on heparin pending possible trach placement. Defer management of anticoagulation to primary team.   Acute on chronic diastolic CHF Acute on chronic hypoxic and hypercarbic respiratory failure complicated by influenza OSA OHS LVEF this admission 60-65%, no RWMA, grade II DD. BNP this admission 411.3->111.6. I/O today show net negative 13.89.  Volume status continues to be difficult to discern but no overt volume overload today. Diffuse wheezing on exam today without rales/rhonchi. Agree with returning patient to PO diuretic, Torsemide 60mg  BID. Would hold on Metolazone at this time. Consider resuming Jardiance prior to d/c BiPAP per primary team/Pulmonology  For questions or updates, please contact [facility] Please consult [email or web address] for contact info under        Signed, Perlie Gold, PA-C

## 2023-11-12 NOTE — Progress Notes (Signed)
NAME:  Chelsea [name], MRN:  [id number], DOB:  08/15/1966, LOS: 8 ADMISSION DATE:  11/03/2023, CONSULTATION DATE:  2/7 REFERRING MD:  Jennette Kettle, CHIEF COMPLAINT:  acute on chronic resp failure severe OSA and possible trach    History of Present Illness:  This is a 58 year old female patient with a significant history of morbid obesity, chronic hypoxic  failure in the setting of OSA and probable element of OHS, moderate aortic stenosis, PAF, deconditioning, and recurrent hospital admissions for acute hypoxic and hypercarbic respiratory failure secondary to acute diastolic heart failure, following decompensated cor pulmonale and volume overload from her underlying OSA/OHS (she has had 6 admissions in 6 months for this). Admitted this time with again recurrent respiratory failure this time likely Mixed picture of influenza A infection and further acute decompensation of her chronic cor pulmonale and diastolic disease.  She has been on noninvasive positive pressure ventilation intermittently since her day of admission on 2/2 with some difficulty weaning off.  She has had ongoing trouble with BiPAP compliance, and like previous hospital admissions it has been recommended that she would benefit from tracheostomy.  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 site bleeding, cervical cancer, frequent diastolic heart failure exacerbations, coronary artery disease, degenerative joint disease, diabetes type 2, hypertension, dependent edema, GERD, IBS, lymphedema of lower extremities,  mixed hyperlipidemia, severe OSA, prior pressure ulcers Significant Hospital Events: Including procedures, antibiotic start and stop dates in addition to other pertinent events   2/2 admitted 2/7 pulmonary asked to evaluate and discuss benefits of tracheostomy  Interim History / Subjective:   She is now tolerating nasasl canula during the day and using bipap at night She is feeling better overall We discussed plan for tracheostomy with her husband present. All questions were answered.  Objective   Blood pressure (!) 101/51, pulse (!) 54, temperature 97.7 F (36.5 C), temperature source Axillary, resp. rate (!) 25, height 5\' 5"  (1.651 m), weight (!) 143.5 kg, SpO2 97%.    FiO2 (%):  [60 %] 60 % PEEP:  [8 cmH20] 8 cmH20 Pressure Support:  [14 cmH20] 14 cmH20   Intake/Output Summary (Last 24 hours) at 11/12/2023 0865 Last data filed at 11/12/2023 7846 Gross per 24 hour  Intake 933.99 ml  Output 3600 ml  Net -2666.01 ml   Filed Weights   11/10/23 0458 11/11/23 0345 11/12/23 0500  Weight: (!) 146.6 kg (!) 145.2 kg (!) 143.5 kg    Examination: General: Morbidly obese 58 year old female, resting in bed, no distress HENT: Neck is large Lungs: Diminished throughout, no wheezing Cardiovascular: Regularly irregular atrial fibrillation Abdomen: obese, soft, non-tender Extremities: wrapped Neuro: Awake alert no focal deficits  Resolved Hospital Problem list     Assessment & Plan:  Acute on chronic hypoxic and hypercarbic respiratory failure Morbid obesity Obstructive sleep apnea Obesity hypoventilation syndrome History of asthma Type III pulmonary hypertension Acute cor pulmonale Acute diastolic heart failure Pulmonary edema History of GERD Poor tolerance of BiPAP Atrial flutter Influenza A Type 2 diabetes Anemia Hyperlipidemia  Hypothyroidism chronic pain  Pulmonary problem list Acute on chronic hypoxic and hypercarbic respiratory failure secondary to acute cor  pulmonale, complicated  by acute diastolic heart failure in the setting of underlying severe OSA and OHS  Discussion  This was complicated by influenza A but almost certainly has underlying type III pulmonary hypertension although her body habitus would be difficult to confirm this without right heart cath -Has a fair amount of difficulty with BiPAP and as far as tolerance and compliance -Had previous tracheostomy, and on past discussions had not wanted to consider this again given episode of tracheostomy site complication and bleeding during 1 hospitalization -Unfortunately it seems as though CPAP/BiPAP is ineffective therapy for Ms. Jacques, and without better treatment of her underlying sleep apnea she will almost certainly be readmitted again with the same problems.  In prior hospital admits the hypercarbia component typically only occurred during decompensation, so by better treating her sleep apnea she may not require nocturnal positive pressure  Plan/recommendation -We continue to recommend tracheostomy as her best and safest option moving forward with nocturnal ventilation - We had long discussion on 2/10 and all questions were answered on why we are recommending tracheostomy. After discussion she was more inclined to move forward with tracheostomy but wanted the night to think about it - ENT has been consulted, awaiting patient's decision to move forward with trach - She can come to the ICU post tracheostomy for recovery and ventilator weaning - Continue to push diuresis as able to tolerate  Best Practice (right click and "Reselect all SmartList Selections" daily)   Per primary   Labs   CBC: Recent Labs  Lab 11/08/23 0450 11/09/23 0530 11/10/23 0540 11/11/23 0500 11/12/23 0455  WBC 7.1 6.1 7.7 8.6 8.8  HGB 11.2* 10.7* 11.5* 12.0 11.3*  HCT 36.8 35.9* 37.5 39.0 37.2  MCV 87.6 88.0 87.0 87.1 86.7  PLT 252 252 264 279 276    Basic Metabolic Panel: Recent Labs  Lab  11/10/23 0540 11/10/23 1524 11/11/23 0500 11/11/23 1906 11/12/23 0455  NA 136 135 136 135 135  K 3.8 3.9 4.1 4.2 4.3  CL 87* 85* 85* 88* 86*  CO2 37* 36* 32 34* 31  GLUCOSE 191* 236* 184* 184* 187*  BUN 19 21* 20 23* 27*  CREATININE 0.87 0.86 1.01* 1.03* 1.15*  CALCIUM 8.6* 9.4 9.4 9.5 9.5  MG 1.7 2.1 1.9 2.3 2.2   GFR: Estimated Creatinine Clearance: 76.2 mL/min (A) (by C-G formula based on SCr of 1.15 mg/dL (H)). Recent Labs  Lab 11/09/23 0530 11/10/23 0540 11/11/23 0500 11/12/23 0455  WBC 6.1 7.7 8.6 8.8    Liver Function Tests: No results for input(s): "AST", "ALT", "ALKPHOS", "BILITOT", "PROT", "ALBUMIN" in the last 168 hours.  No results for input(s): "LIPASE", "AMYLASE" in the last 168 hours. No results for input(s): "AMMONIA" in the last 168 hours.  ABG    Component Value Date/Time   PHART 7.349 (L) 08/15/2023 1402   PCO2ART 96.6 (HH) 08/15/2023 1402   PO2ART 54 (L) 08/15/2023 1402   HCO3 42.2 (H) 11/06/2023 0445   TCO2 42 (H) 11/03/2023 1955   ACIDBASEDEF 7.9 (H) 09/20/2023 2156   O2SAT 74.8 11/06/2023 0445     Coagulation Profile: No results for input(s): "INR", "PROTIME" in the last 168 hours.  Cardiac Enzymes: No results for input(s): "CKTOTAL", "CKMB", "CKMBINDEX", "TROPONINI" in the last 168 hours.  HbA1C: Hgb A1c MFr Bld  Date/Time Value Ref Range Status  08/16/2023 04:19 AM 9.3 (H) 4.8 - 5.6 % Final    Comment:    (NOTE) Pre diabetes:  5.7%-6.4%  Diabetes:              >6.4%  Glycemic control for   <7.0% adults with diabetes   05/29/2023 11:43 AM 8.5 (H) 4.8 - 5.6 % Final    Comment:    (NOTE) Pre diabetes:          5.7%-6.4%  Diabetes:              >6.4%  Glycemic control for   <7.0% adults with diabetes     CBG: Recent Labs  Lab 11/10/23 1654 11/11/23 0822 11/11/23 1322 11/11/23 1630 11/12/23 0656  GLUCAP 241* 171* 174* 168* 183*     Critical care time:     Melody Comas, MD [facility] Office: [phone]   See Amion for personal pager PCCM on call pager [phone] until 7pm. Please call Elink 7p-7a. [phone]

## 2023-11-12 NOTE — TOC Progression Note (Signed)
Transition of Care [facility]) - Progression Note    Patient Details  Name: Chelsea [name] MRN: [id number] Date of Birth: [birth year]  Transition of Care [facility]) CM/SW Contact  Delilah Shan, LCSWA Phone Number: 11/12/2023, 11:23 AM  Clinical Narrative:     Patient is from [facility]. MD informed CSW that patient is considering a trach, and would like to speak with CSW in regards to placement options. CSW spoke with Kia with [facility] who informed CSW that she will need to speak with administrator to confirm if patient can return if receives trach. CSW awaiting to hear back from Kia with [facility] then will follow up with patient to assist with questions.  Update- CSW received call back from Kia with [facility] who informed CSW that if patient receives a trach patient would be able to return back to facility after 45 days or more. CSW informed patient. All questions answered. No further questions reported at this time.   Expected Discharge Plan: Skilled Nursing Facility Barriers to Discharge: Continued Medical Work up  Expected Discharge Plan and Services In-house Referral: Clinical Social Work     Living arrangements for the past 2 months: Skilled Nursing Facility                                       Social Determinants of Health (SDOH) Interventions SDOH Screenings   Food Insecurity: No Food Insecurity (11/04/2023)  Housing: Low Risk  (11/04/2023)  Transportation Needs: No Transportation Needs (11/04/2023)  Utilities: Not At Risk (11/04/2023)  Alcohol Screen: Low Risk  (07/05/2022)  Financial Resource Strain: Low Risk  (07/05/2022)  Physical Activity: Insufficiently Active (03/27/2021)   Received from [facility]  Social Connections: Unknown (02/04/2022)   Received from [facility]  Stress: No Stress Concern Present (09/15/2021)   Received from [facility]  Tobacco Use: Low Risk  (11/03/2023)    Readmission Risk Interventions    07/23/2022     2:19 PM 04/09/2022    4:17 PM  Readmission Risk Prevention Plan  Transportation Screening Complete Complete  PCP or Specialist Appt within 3-5 Days  Complete  HRI or Home Care Consult  Complete  Social Work Consult for Recovery Care Planning/Counseling  Complete  Palliative Care Screening  Not Applicable  Medication Review [employer]) Complete Referral to Pharmacy  PCP or Specialist appointment within 3-5 days of discharge Complete   HRI or Home Care Consult Complete   SW Recovery Care/Counseling Consult Complete   Palliative Care Screening Not Applicable   Skilled Nursing Facility Complete

## 2023-11-12 NOTE — Assessment & Plan Note (Addendum)
Patient with an additional 3.6 L of output the past 24 hours. Continues to be difficult to assess volume status given body habitus.  Lungs are without significant signs of pulmonary edema. ECHO consistent with Grade II diastolic dysfunction, EF 60-65%/  Continue diuresis on home medications - Discontinue Lasix 80 mg IV BID and restart Home Torsemide 60 mg BID  - Continue Strict I/Os - Daily weights - Monitor BMP and Mag daily with diuresis  - Replete lytes as needed  - Repeat BNP improved 111 - Restart home jardiance 25 mg

## 2023-11-12 NOTE — Plan of Care (Signed)

## 2023-11-12 NOTE — Progress Notes (Signed)
Orthopedic Tech Progress Note Patient Details:  Chelsea [name] 11-13-1965 [id number]  Removed old UNNA BOOTS since RN had not removed them, patient legs were really irritated and red from where the boots had dried on patient legs. She was not ready to have a new pair on since the old one so painful from removal. I did use water and soap to remove them.  Patient ID: Chelsea [name], female   DOB: [birth year], 58 y.o.   MRN: [id number]  [name] 11/12/2023, 5:01 PM

## 2023-11-12 NOTE — Progress Notes (Signed)
PHARMACY - ANTICOAGULATION CONSULT NOTE  Pharmacy Consult for Heparin Indication: atrial flutter  Allergies  Allergen Reactions   Iodinated Contrast Media Anaphylaxis   Penicillins Anaphylaxis    Patient tolerates cefepime (08/2017)   Insulin Lispro Other (See Comments)    Adder per [facility] from SNF   Minoxidil Hives    Patient Measurements: Height: 5\' 5"  (165.1 cm) Weight: (!) 143.5 kg (316 lb 5.8 oz) IBW/kg (Calculated) : 57 Heparin Dosing Weight: 95 kg  Vital Signs: Temp: 97.7 F (36.5 C) (02/11 0434) Temp Source: Axillary (02/11 0434) BP: 101/51 (02/11 0539) Pulse Rate: 54 (02/11 0539)  Labs: Recent Labs    11/09/23 0908 11/09/23 1555 11/09/23 1555 11/10/23 0540 11/10/23 1524 11/11/23 0500 11/11/23 1906 11/12/23 0455  HGB  --   --    < > 11.5*  --  12.0  --  11.3*  HCT  --   --   --  37.5  --  39.0  --  37.2  PLT  --   --   --  264  --  279  --  276  APTT 100* 47*  --   --   --   --   --   --   HEPARINUNFRC 0.75* 0.29*  --  0.43  --  0.63  --  0.46  CREATININE  --   --   --  0.87   < > 1.01* 1.03* 1.15*   < > = values in this interval not displayed.    Estimated Creatinine Clearance: 76.2 mL/min (A) (by C-G formula based on SCr of 1.15 mg/dL (H)).   Assessment: 58 y.o. female with Aflutter, apixaban on hold, for heparin pending decision regarding possible trach for hypercarbia.  Last apixaban dose 2/4 PM.    2/12 AM: heparin level 0.46, therapeutic on 2350 units/hr. No bleeding noted. CBC stable. Awaiting patient decision on trach.  Goal of Therapy:  Heparin level 0.3-0.7 units/ml Monitor platelets by anticoagulation protocol: Yes   Plan:  Continue heparin drip at 2350 units/hr Heparin level and CBC daily Monitor for s/sx of bleeding F/u ability to resume apixaban  Thank you for involving pharmacy in this patient's care.  Loura Back, PharmD, BCPS Clinical Pharmacist Clinical phone for 11/12/2023 is [phone] 11/12/2023 7:08 AM

## 2023-11-12 NOTE — Assessment & Plan Note (Addendum)
Patient is awake and alert on high flow nasal cannula this morning.  Oxygenating appropriately to 97%.  She denies any chest discomfort or wheezing. Pt consider trach after discussion with pulm this morning and wants to discuss dispo placement if to undergo  - Continuous pulse ox - Continue BiPAP while sleeping/naps, wean as tolerated to O2goal of 88-92% - NPO while on BiPAP  -Patient tolerating HFNC while awake may have diet. -Patient has completed her course of CTX  - AM CBC, BMP  - ENT consult as needed for possible trach

## 2023-11-12 NOTE — Progress Notes (Signed)
Pt slept all night with BIPAP on.

## 2023-11-12 NOTE — Assessment & Plan Note (Addendum)
Patient remains afebrile and without significant symptoms. - Tamiflu (2/6 -2/10) completed 5 day course  - Bcx NGTD   Chronic and Stable Problems:  COPD: Continue home inhalers T2DM: Continue Heart Healthy Diet, will adjust insulin needs as appropriate Anemia: Continue iron supplementation HLD: Continue home atorvastatin Hypothyroidism: Continue home levothyroxine RLS: Continue home ropinerole  GERD: Continue pantoprazole Chronic pain: Pain well controlled currently  FEN/GI: Heart Healthy  PPx: Heparin >>> transition back to Eliquis if no procedure  Dispo:Return to ALF pending clinical improvement .

## 2023-11-12 NOTE — Assessment & Plan Note (Addendum)
No episodes of atrial flutter overnight per EMR rates 50-70s. She denies palpitations this morning.  - Cards, following, appreciate recs  - Continue Lopressor 12.5 mg q6h today and transition to Toprol XL  50 mg daily tomorrow  - Continue home amiodarone 200 mg daily  - Restarted Home Jardiance 25 mg daily

## 2023-11-12 NOTE — Progress Notes (Signed)
RT removed pt from BiPAP and placed pt on salter at 10L. Pt tolerating well at this time. BiPAP is on stby at bedside. RT will continue to monitor pt.

## 2023-11-12 NOTE — Progress Notes (Signed)
Daily Progress Note Intern Pager: [phone]  Patient name: Chelsea [name] Medical record number: [id number] Date of birth: 31-Jan-1966 Age: 58 y.o. Gender: female  Primary Care Provider: Pcp, No Consultants: Cardiology, pulmonology Code Status: Full  Pt Overview and Major Events to Date:  2/2: Admitted 2/5: Patient with atrial flutter, cardiology consulted, started on diltiazem 2/6: Maxed out on diltiazem and amio, added IV dig per cards 2/7: Bolus of amio 150 cc x1, IV dig. 2/8: Patient restarted on dilt drip after recurrence of Afib w/ RVR   2/10: ECHO   Assessment and Plan:  Patient is a 58 year old female presenting with acute hypoxic respiratory failure secondary to pneumonia versus heart failure exacerbation.  She has a longstanding history of hypercarbic respiratory failure due to poor respiratory drive. Assessment & Plan Acute respiratory failure with hypoxia and hypercarbia (HCC) Patient is awake and alert on high flow nasal cannula this morning.  Oxygenating appropriately to 97%.  She denies any chest discomfort or wheezing. Pt consider trach after discussion with pulm this morning and wants to discuss dispo placement if to undergo  - Continuous pulse ox - Continue BiPAP while sleeping/naps, wean as tolerated to O2goal of 88-92% - NPO while on BiPAP  -Patient tolerating HFNC while awake may have diet. -Patient has completed her course of CTX  - AM CBC, BMP  - ENT consult as needed for possible trach Atrial flutter (HCC) No episodes of atrial flutter overnight per EMR rates 50-70s. She denies palpitations this morning.  - Cards, following, appreciate recs  - Continue Lopressor 12.5 mg q6h today and transition to Toprol XL  50 mg daily tomorrow  - Continue home amiodarone 200 mg daily  - Restarted Home Jardiance 25 mg daily  (HFpEF) heart failure with preserved ejection fraction [facility]) Patient with an additional 3.6 L of output the past 24 hours. Continues to be  difficult to assess volume status given body habitus.  Lungs are without significant signs of pulmonary edema. ECHO consistent with Grade II diastolic dysfunction, EF 60-65%/  Continue diuresis on home medications - Discontinue Lasix 80 mg IV BID and restart Home Torsemide 60 mg BID  - Continue Strict I/Os - Daily weights - Monitor BMP and Mag daily with diuresis  - Replete lytes as needed  - Repeat BNP improved 111 - Restart home jardiance 25 mg  H1N1 influenza Patient remains afebrile and without significant symptoms. - Tamiflu (2/6 -2/10) completed 5 day course  - Bcx NGTD   Chronic and Stable Problems:  COPD: Continue home inhalers T2DM: Continue Heart Healthy Diet, will adjust insulin needs as appropriate Anemia: Continue iron supplementation HLD: Continue home atorvastatin Hypothyroidism: Continue home levothyroxine RLS: Continue home ropinerole  GERD: Continue pantoprazole Chronic pain: Pain well controlled currently  FEN/GI: Heart Healthy  PPx: Heparin >>> transition back to Eliquis if no procedure  Dispo:Return to ALF pending clinical improvement .     Subjective:  On interview, patient denies palpitations overnight. Denied chest pain or shortness of breath. Leaning towards getting trach, would like to speak more with social work about dispo plan if to pursue trach.    Objective: Temp:  [97.7 F (36.5 C)-98.2 F (36.8 C)] 97.8 F (36.6 C) (02/11 0732) Pulse Rate:  [53-74] 53 (02/11 0732) Resp:  [14-25] 16 (02/11 0732) BP: (90-108)/(51-80) 97/80 (02/11 0732) SpO2:  [95 %-98 %] 97 % (02/11 0732) FiO2 (%):  [60 %] 60 % (02/10 2339) Weight:  [143.5 kg] 143.5 kg (02/11 0500) Physical Exam: General:  A&O, no acute distress, Cardiovascular: Per tele: regular rate and rhythm in 70s Respiratory: Respiratory rate WNL, appeared comfortable on HF [place] , no increased work of breathing Abdomen: Nontender Extremities: Moves all 4 spontaneously and appropriately, chronic venous  skin changes   Laboratory: Most recent CBC Lab Results  Component Value Date   WBC 8.8 11/12/2023   HGB 11.3 (L) 11/12/2023   HCT 37.2 11/12/2023   MCV 86.7 11/12/2023   PLT 276 11/12/2023   Most recent BMP    Latest Ref Rng & Units 11/12/2023    4:55 AM  BMP  Glucose 70 - 99 mg/dL 010   BUN 6 - 20 mg/dL 27   Creatinine 2.72 - 1.00 mg/dL 5.36   Sodium 644 - 034 mmol/L 135   Potassium 3.5 - 5.1 mmol/L 4.3   Chloride 98 - 111 mmol/L 86   CO2 22 - 32 mmol/L 31   Calcium 8.9 - 10.3 mg/dL 9.5    Recent Imaging:   Echo 2/10:   1. Technically difficult; Calcified aortic valve with likely moderate AS  (mean gradient 21 mmHg; AVA calculated at 0.9 cm2; DI 0.30).   2. Left ventricular ejection fraction, by estimation, is 60 to 65%. The  left ventricle has normal function. The left ventricle has no regional  wall motion abnormalities. There is mild left ventricular hypertrophy.  Left ventricular diastolic parameters  are consistent with Grade II diastolic dysfunction (pseudonormalization).  Elevated left atrial pressure.   3. Right ventricular systolic function is normal. The right ventricular  size is normal.   4. Left atrial size was moderately dilated.   5. The mitral valve is normal in structure. No evidence of mitral valve  regurgitation. No evidence of mitral stenosis.   6. The aortic valve is calcified. Aortic valve regurgitation is mild.  Moderate aortic valve stenosis.   7. The inferior vena cava is normal in size with greater than 50%  respiratory variability, suggesting right atrial pressure of 3 mmHg.    Peterson Ao, MD 11/12/2023, 8:01 AM  PGY-1, [facility] FPTS Intern pager: [phone], text pages welcome Secure chat group  [facility] Teaching Service

## 2023-11-13 DIAGNOSIS — I5033 Acute on chronic diastolic (congestive) heart failure: Secondary | ICD-10-CM | POA: Diagnosis not present

## 2023-11-13 DIAGNOSIS — I4891 Unspecified atrial fibrillation: Secondary | ICD-10-CM | POA: Diagnosis not present

## 2023-11-13 DIAGNOSIS — J9621 Acute and chronic respiratory failure with hypoxia: Secondary | ICD-10-CM | POA: Diagnosis not present

## 2023-11-13 LAB — BASIC METABOLIC PANEL
Anion gap: 17 — ABNORMAL HIGH (ref 5–15)
BUN: 36 mg/dL — ABNORMAL HIGH (ref 6–20)
CO2: 31 mmol/L (ref 22–32)
Calcium: 9.1 mg/dL (ref 8.9–10.3)
Chloride: 83 mmol/L — ABNORMAL LOW (ref 98–111)
Creatinine, Ser: 1.23 mg/dL — ABNORMAL HIGH (ref 0.44–1.00)
GFR, Estimated: 51 mL/min — ABNORMAL LOW (ref >60)
Glucose, Bld: 171 mg/dL — ABNORMAL HIGH (ref 70–99)
Potassium: 3.9 mmol/L (ref 3.5–5.1)
Sodium: 131 mmol/L — ABNORMAL LOW (ref 135–145)

## 2023-11-13 LAB — GLUCOSE, CAPILLARY
Glucose-Capillary: 180 mg/dL — ABNORMAL HIGH (ref 70–99)
Glucose-Capillary: 195 mg/dL — ABNORMAL HIGH (ref 70–99)
Glucose-Capillary: 204 mg/dL — ABNORMAL HIGH (ref 70–99)
Glucose-Capillary: 209 mg/dL — ABNORMAL HIGH (ref 70–99)

## 2023-11-13 LAB — CBC
HCT: 39.2 % (ref 36.0–46.0)
Hemoglobin: 12.2 g/dL (ref 12.0–15.0)
MCH: 26.6 pg (ref 26.0–34.0)
MCHC: 31.1 g/dL (ref 30.0–36.0)
MCV: 85.6 fL (ref 80.0–100.0)
Platelets: 306 10*3/uL (ref 150–400)
RBC: 4.58 MIL/uL (ref 3.87–5.11)
RDW: 15.4 % (ref 11.5–15.5)
WBC: 9.6 10*3/uL (ref 4.0–10.5)
nRBC: 0 % (ref 0.0–0.2)

## 2023-11-13 LAB — HEPARIN LEVEL (UNFRACTIONATED): Heparin Unfractionated: 0.66 [IU]/mL (ref 0.30–0.70)

## 2023-11-13 LAB — MAGNESIUM: Magnesium: 2.2 mg/dL (ref 1.7–2.4)

## 2023-11-13 MED ORDER — ENOXAPARIN SODIUM 150 MG/ML IJ SOSY
140.0000 mg | PREFILLED_SYRINGE | Freq: Two times a day (BID) | INTRAMUSCULAR | Status: DC
  Administered 2023-11-13 – 2023-11-14 (×3): 140 mg via SUBCUTANEOUS
  Filled 2023-11-13 (×4): qty 0.94

## 2023-11-13 NOTE — Assessment & Plan Note (Addendum)
Patient with an additional 3.5 L of output the past 24 hours. Continues to be difficult to assess volume status given body habitus.  Lungs are without significant signs of pulmonary edema. ECHO consistent with Grade II diastolic dysfunction, EF 60-65%/  Continue diuresis on home medications - Continue Home Torsemide 60 mg BID  - Continue Strict I/Os - Fluid restriction 1200 mL, mildly hyponatremic 131 AM  - Daily weights - Monitor BMP and Mag daily with diuresis  - Replete lytes as needed  - Continue home jardiance 25 mg

## 2023-11-13 NOTE — Assessment & Plan Note (Addendum)
Patient is awake and alert on 10L high flow nasal cannula this morning.  Oxygenating appropriately to 97%.  She denies any chest discomfort or wheezing. Pt still considering trach placement and unsure of a decision. Discussed the option of pursuing while inpatient vs further consideration after discharged to facility.  - Continuous pulse ox - Continue BiPAP while sleeping/naps, wean as tolerated to O2goal of 88-92% - NPO while on BiPAP  -Patient tolerating HFNC while awake may have diet. -Patient has completed her course of CTX  - ENT consult as needed for possible trach - TOC assisting with dispo planning, appreciate assistance

## 2023-11-13 NOTE — TOC Progression Note (Signed)
Transition of Care [facility]) - Progression Note    Patient Details  Name: Chelsea [name] MRN: [id number] Date of Birth: 10-Mar-1966  Transition of Care [facility]) CM/SW Contact  Delilah Shan, LCSWA Phone Number: 11/13/2023, 12:48 PM  Clinical Narrative:     CSW awaiting call back from Kia with [facility].  Expected Discharge Plan: Skilled Nursing Facility Barriers to Discharge: Continued Medical Work up  Expected Discharge Plan and Services In-house Referral: Clinical Social Work     Living arrangements for the past 2 months: Skilled Nursing Facility                                       Social Determinants of Health (SDOH) Interventions SDOH Screenings   Food Insecurity: No Food Insecurity (11/04/2023)  Housing: Low Risk  (11/04/2023)  Transportation Needs: No Transportation Needs (11/04/2023)  Utilities: Not At Risk (11/04/2023)  Alcohol Screen: Low Risk  (07/05/2022)  Financial Resource Strain: Low Risk  (07/05/2022)  Physical Activity: Insufficiently Active (03/27/2021)   Received from [facility], [facility]  Social Connections: Unknown (02/04/2022)   Received from [facility]  Stress: No Stress Concern Present (09/15/2021)   Received from [facility], [facility]  Tobacco Use: Low Risk  (11/03/2023)    Readmission Risk Interventions    07/23/2022    2:19 PM 04/09/2022    4:17 PM  Readmission Risk Prevention Plan  Transportation Screening Complete Complete  PCP or Specialist Appt within 3-5 Days  Complete  HRI or Home Care Consult  Complete  Social Work Consult for Recovery Care Planning/Counseling  Complete  Palliative Care Screening  Not Applicable  Medication Review Oceanographer) Complete Referral to Pharmacy  PCP or Specialist appointment within 3-5 days of discharge Complete   HRI or Home Care Consult Complete   SW Recovery Care/Counseling Consult Complete   Palliative Care Screening Not Applicable   Skilled Nursing Facility Complete

## 2023-11-13 NOTE — Progress Notes (Addendum)
Daily Progress Note Intern Pager: [phone]  Patient name: Chelsea [name] Medical record number: [id number] Date of birth: Aug 17, 1966 Age: 58 y.o. Gender: female  Primary Care Provider: Pcp, No Consultants: Cardiology, pulmonology Code Status: Full  Pt Overview and Major Events to Date:  2/2: Admitted 2/5: Patient with atrial flutter, cardiology consulted, started on diltiazem 2/6: Maxed out on diltiazem and amio, added IV dig per cards 2/7: Bolus of amio 150 cc x1, IV dig. 2/8: Patient restarted on dilt drip after recurrence of Afib w/ RVR   2/10: ECHO   Assessment and Plan:  Patient is a 58 year old female presenting with acute hypoxic respiratory failure secondary to pneumonia versus heart failure exacerbation.  She has a longstanding history of hypercarbic respiratory failure due to poor respiratory drive. Assessment & Plan Acute respiratory failure with hypoxia and hypercarbia (HCC) Patient is awake and alert on 10L high flow nasal cannula this morning.  Oxygenating appropriately to 97%.  She denies any chest discomfort or wheezing. Pt still considering trach placement and unsure of a decision. Discussed the option of pursuing while inpatient vs further consideration after discharged to facility.  - Continuous pulse ox - Continue BiPAP while sleeping/naps, wean as tolerated to O2goal of 88-92% - NPO while on BiPAP  -Patient tolerating HFNC while awake may have diet. -Patient has completed her course of CTX  - ENT consult as needed for possible trach - TOC assisting with dispo planning, appreciate assistance  Atrial flutter (HCC) No episodes of atrial flutter overnight per EMR rates 50-70s. She denies palpitations this morning. Cardiology signed off and appreciate recs.   - Start Toprol XL  50 mg daily - Continue home amiodarone 200 mg daily  (HFpEF) heart failure with preserved ejection fraction [facility]) Patient with an additional 3.5 L of output the past 24 hours. Continues  to be difficult to assess volume status given body habitus.  Lungs are without significant signs of pulmonary edema. ECHO consistent with Grade II diastolic dysfunction, EF 60-65%/  Continue diuresis on home medications - Continue Home Torsemide 60 mg BID  - Continue Strict I/Os - Fluid restriction 1200 mL, mildly hyponatremic 131 AM  - Daily weights - Monitor BMP and Mag daily with diuresis  - Replete lytes as needed  - Continue home jardiance 25 mg  H1N1 influenza Patient remains afebrile and without significant symptoms. - Tamiflu (2/6 -2/10) completed 5 day course  - Bcx NGTD   Chronic and Stable Problems:  COPD: Continue home inhalers T2DM: Continue Heart Healthy Diet, will adjust insulin needs as appropriate Anemia: Continue iron supplementation HLD: Continue home atorvastatin Hypothyroidism: Continue home levothyroxine RLS: Continue home ropinerole  GERD: Continue pantoprazole Chronic pain: Pain well controlled currently  FEN/GI: Heart Healthy  PPx: Lovenox  >>> transition back to Eliquis if no procedure  Dispo: Approaching medical stability, inquiring return to ALF     Subjective:  On interview, patient still uncertain about undergoing trach. Denies any further palpitations, chest pain or SOB.   Objective: Temp:  [98.5 F (36.9 C)-98.6 F (37 C)] 98.5 F (36.9 C) (02/12 0312) Pulse Rate:  [57-66] 57 (02/12 0312) Resp:  [20-24] 20 (02/12 0312) BP: (89-104)/(58-64) 97/62 (02/12 0312) SpO2:  [92 %-98 %] 95 % (02/12 0818) FiO2 (%):  [50 %] 50 % (02/12 0818) Weight:  [140.8 kg] 140.8 kg (02/12 0500) Physical Exam: General: A&O, no acute distress, Cardiovascular: Per tele: regular rate and rhythm in 60s Respiratory: Respiratory rate WNL, appeared comfortable on HF [place] ,  no increased work of breathing Abdomen: Nontender Extremities: Moves all 4 spontaneously and appropriately, chronic venous skin changes   Laboratory: Most recent CBC Lab Results  Component Value  Date   WBC 9.6 11/13/2023   HGB 12.2 11/13/2023   HCT 39.2 11/13/2023   MCV 85.6 11/13/2023   PLT 306 11/13/2023   Most recent BMP    Latest Ref Rng & Units 11/13/2023    4:58 AM  BMP  Glucose 70 - 99 mg/dL 161   BUN 6 - 20 mg/dL 36   Creatinine 0.96 - 1.00 mg/dL 0.45   Sodium 409 - 811 mmol/L 131   Potassium 3.5 - 5.1 mmol/L 3.9   Chloride 98 - 111 mmol/L 83   CO2 22 - 32 mmol/L 31   Calcium 8.9 - 10.3 mg/dL 9.1    Recent Imaging:   Echo 2/10:   1. Technically difficult; Calcified aortic valve with likely moderate AS  (mean gradient 21 mmHg; AVA calculated at 0.9 cm2; DI 0.30).   2. Left ventricular ejection fraction, by estimation, is 60 to 65%. The  left ventricle has normal function. The left ventricle has no regional  wall motion abnormalities. There is mild left ventricular hypertrophy.  Left ventricular diastolic parameters  are consistent with Grade II diastolic dysfunction (pseudonormalization).  Elevated left atrial pressure.   3. Right ventricular systolic function is normal. The right ventricular  size is normal.   4. Left atrial size was moderately dilated.   5. The mitral valve is normal in structure. No evidence of mitral valve  regurgitation. No evidence of mitral stenosis.   6. The aortic valve is calcified. Aortic valve regurgitation is mild.  Moderate aortic valve stenosis.   7. The inferior vena cava is normal in size with greater than 50%  respiratory variability, suggesting right atrial pressure of 3 mmHg.    Peterson Ao, MD 11/13/2023, 8:42 AM  PGY-1, [facility] Family Medicine FPTS Intern pager: [phone], text pages welcome Secure chat group [facility] Teaching Service

## 2023-11-13 NOTE — Discharge Instructions (Signed)
Dear Chelsea [name],   Thank you for letting us participate in your care! In this section, you will find a brief hospital admission summary of why you were admitted to the hospital, what happened during your admission, your diagnosis/diagnoses, and recommended follow up.  Primary diagnosis: Acute Respiratory Failure Treatment plan: Continue to use supplemental oxygen and wear bipap nightly Secondary diagnosis: Atrial Flutter with RVR Treatment plan: review changes made to your medications in the following sections, and follow up with your cardiologist   POST-HOSPITAL & CARE INSTRUCTIONS We recommend following up with your PCP within 1 week from being discharged from the hospital. Please let PCP/Specialists know of any changes in medications that were made which you will be able to see in the medications section of this packet. Please also follow up   DOCTOR'S APPOINTMENTS & FOLLOW UP No future appointments.   Thank you for choosing [facility]! Take care and be well!  Family Medicine Teaching Service Inpatient Team [place]  [facility]  [address] [place], [place] [address] [phone]

## 2023-11-13 NOTE — Assessment & Plan Note (Addendum)
No episodes of atrial flutter overnight per EMR rates 50-70s. She denies palpitations this morning. Cardiology signed off and appreciate recs.   - Start Toprol XL  50 mg daily - Continue home amiodarone 200 mg daily

## 2023-11-13 NOTE — Progress Notes (Signed)
PHARMACY - ANTICOAGULATION CONSULT NOTE  Pharmacy Consult for Heparin Indication: atrial flutter  Allergies  Allergen Reactions   Iodinated Contrast Media Anaphylaxis   Penicillins Anaphylaxis    Patient tolerates cefepime (08/2017)   Insulin Lispro Other (See Comments)    Adder per [facility] from SNF   Minoxidil Hives    Patient Measurements: Height: 5\' 5"  (165.1 cm) Weight: (!) 140.8 kg (310 lb 6.5 oz) IBW/kg (Calculated) : 57 Heparin Dosing Weight: 95 kg  Vital Signs: Temp: 98.5 F (36.9 C) (02/12 0312) Temp Source: Oral (02/12 0312) BP: 97/62 (02/12 0312) Pulse Rate: 57 (02/12 0312)  Labs: Recent Labs    11/11/23 0500 11/11/23 1906 11/12/23 0455 11/13/23 0458  HGB 12.0  --  11.3* 12.2  HCT 39.0  --  37.2 39.2  PLT 279  --  276 306  HEPARINUNFRC 0.63  --  0.46 0.66  CREATININE 1.01* 1.03* 1.15* 1.23*    Estimated Creatinine Clearance: 70.4 mL/min (A) (by C-G formula based on SCr of 1.23 mg/dL (H)).   Assessment: 58 y.o. female with Aflutter, apixaban on hold, for heparin pending decision regarding possible trach for hypercarbia.  Last apixaban dose 2/4 PM.    2/13 AM: heparin level 0.66, therapeutic on 2350 units/hr. No bleeding noted. CBC stable. Awaiting patient decision on trach.  Goal of Therapy:  Heparin level 0.3-0.7 units/ml Monitor platelets by anticoagulation protocol: Yes   Plan:  Continue heparin drip at 2350 units/hr Heparin level and CBC daily Monitor for s/sx of bleeding F/u ability to resume apixaban  Thank you for involving pharmacy in this patient's care.  Dixie Dials, PharmD, BCPS Clinical Pharmacist Clinical phone for 11/13/2023 is [phone] 11/13/2023 8:55 AM

## 2023-11-13 NOTE — Assessment & Plan Note (Addendum)
Patient remains afebrile and without significant symptoms. - Tamiflu (2/6 -2/10) completed 5 day course  - Bcx NGTD   Chronic and Stable Problems:  COPD: Continue home inhalers T2DM: Continue Heart Healthy Diet, will adjust insulin needs as appropriate Anemia: Continue iron supplementation HLD: Continue home atorvastatin Hypothyroidism: Continue home levothyroxine RLS: Continue home ropinerole  GERD: Continue pantoprazole Chronic pain: Pain well controlled currently  FEN/GI: Heart Healthy  PPx: Lovenox  >>> transition back to Eliquis if no procedure  Dispo: Approaching medical stability, inquiring return to ALF

## 2023-11-13 NOTE — Plan of Care (Signed)

## 2023-11-14 DIAGNOSIS — J9601 Acute respiratory failure with hypoxia: Secondary | ICD-10-CM | POA: Diagnosis not present

## 2023-11-14 LAB — MAGNESIUM: Magnesium: 2.3 mg/dL (ref 1.7–2.4)

## 2023-11-14 LAB — BASIC METABOLIC PANEL
Anion gap: 19 — ABNORMAL HIGH (ref 5–15)
BUN: 47 mg/dL — ABNORMAL HIGH (ref 6–20)
CO2: 33 mmol/L — ABNORMAL HIGH (ref 22–32)
Calcium: 9.8 mg/dL (ref 8.9–10.3)
Chloride: 82 mmol/L — ABNORMAL LOW (ref 98–111)
Creatinine, Ser: 1.61 mg/dL — ABNORMAL HIGH (ref 0.44–1.00)
GFR, Estimated: 37 mL/min — ABNORMAL LOW (ref >60)
Glucose, Bld: 177 mg/dL — ABNORMAL HIGH (ref 70–99)
Potassium: 4 mmol/L (ref 3.5–5.1)
Sodium: 134 mmol/L — ABNORMAL LOW (ref 135–145)

## 2023-11-14 LAB — GLUCOSE, CAPILLARY
Glucose-Capillary: 195 mg/dL — ABNORMAL HIGH (ref 70–99)
Glucose-Capillary: 171 mg/dL — ABNORMAL HIGH (ref 70–99)
Glucose-Capillary: 212 mg/dL — ABNORMAL HIGH (ref 70–99)
Glucose-Capillary: 166 mg/dL — ABNORMAL HIGH (ref 70–99)

## 2023-11-14 MED ORDER — TORSEMIDE 20 MG PO TABS
60.0000 mg | ORAL_TABLET | Freq: Two times a day (BID) | ORAL | Status: DC
Start: 2023-11-15 — End: 2023-11-16
  Administered 2023-11-15: 60 mg via ORAL
  Filled 2023-11-14 (×2): qty 3

## 2023-11-14 MED ORDER — APIXABAN 5 MG PO TABS
5.0000 mg | ORAL_TABLET | Freq: Two times a day (BID) | ORAL | Status: DC
  Administered 2023-11-14 – 2023-11-15 (×3): 5 mg via ORAL
  Filled 2023-11-14 (×3): qty 1

## 2023-11-14 NOTE — Assessment & Plan Note (Signed)
Pt with uptrending Cr of 1.61 AM , Basline 0.6-9  suspect renal insult given aggressive diuresis throughout admission  - CTM  - Holding PM dose of home torsemide  - AM BMP

## 2023-11-14 NOTE — Assessment & Plan Note (Signed)
Patient remains afebrile and without significant symptoms. - Tamiflu (2/6 -2/10) completed 5 day course  - Bcx NGTD   Chronic and Stable Problems:  COPD: Continue home inhalers T2DM: Continue Heart Healthy Diet, will adjust insulin needs as appropriate Anemia: Continue iron supplementation HLD: Continue home atorvastatin Hypothyroidism: Continue home levothyroxine RLS: Continue home ropinerole  GERD: Continue pantoprazole Chronic pain: Pain well controlled currently  FEN/GI: Heart Healthy  PPx: Home Eliquis  Dispo: Approaching medical stability, inquiring return to ALF

## 2023-11-14 NOTE — Assessment & Plan Note (Addendum)
Patient with an additional 2.4 L of output the past 24 hours. Continues to be difficult to assess volume status given body habitus.  Lungs are without significant signs of pulmonary edema. ECHO consistent with Grade II diastolic dysfunction, EF 60-65%.  Continue diuresis on home medications - Continue Home Torsemide 60 mg BID, will hold PM dose  - Continue Strict I/Os - Fluid restriction 1200 mL, mild hyponatremic improving 134 AM  - Daily weights - Monitor BMP and Mag daily with diuresis  - Replete lytes as needed  - Continue home jardiance 25 mg

## 2023-11-14 NOTE — Progress Notes (Addendum)
Daily Progress Note Intern Pager: [phone]  Patient name: Chelsea [name] Medical record number: [id number] Date of birth: [birth year] Age: 58 y.o. Gender: female  Primary Care Provider: Pcp, No Consultants: Cardiology, Pulmonology, ENT  Code Status: Full   Pt Overview and Major Events to Date:  2/2: Admitted 2/5: Patient with atrial flutter, cardiology consulted, started on diltiazem 2/6: Maxed out on diltiazem and amio, added IV dig per cards 2/7: Bolus of amio 150 cc x1, IV dig. 2/8: Patient restarted on dilt drip after recurrence of Afib w/ RVR   2/10: ECHO    Assessment and Plan:   Patient is a 58 year old female presenting with acute hypoxic respiratory failure secondary to pneumonia versus heart failure exacerbation.  She has a longstanding history of hypercarbic respiratory failure due to poor respiratory drive. She has continued to improve towards her baseline and anticipate medical stability for discharge to facility soon.  Assessment & Plan Acute respiratory failure with hypoxia and hypercarbia (HCC) Patient is awake and alert on 10L high flow nasal cannula this morning.  Oxygenating appropriately to 98%.  She denies any chest discomfort or wheezing. Pt would like to defer trach placement at this time and consider more once discharged. Wean oxygen as tolerated, with goal of satting well on 8L given facility requirements.  - Continuous pulse ox - Continue BiPAP while sleeping/naps, wean as tolerated to O2goal of 88-92% on at least 8L [place], patient previously stable on 5L   - NPO while on BiPAP  -Patient tolerating [place] while awake may have diet. -Patient has completed her course of CTX  - TOC, she can return to facility once medically stable, appreciate assistance  Acute kidney injury (HCC) Pt with uptrending Cr of 1.61 AM , Basline 0.6-9  suspect renal insult given aggressive diuresis throughout admission  - CTM  - Holding PM dose of home torsemide  - AM BMP   Atrial flutter (HCC) No episodes of atrial flutter overnight per EMR rates 50-70s. She denies palpitations this morning. Cardiology signed off and appreciate recs.   - Continue Toprol XL 50 mg daily - Continue home amiodarone 200 mg daily  - Restart Home Eliquis  (HFpEF) heart failure with preserved ejection fraction [facility]) Patient with an additional 2.4 L of output the past 24 hours. Continues to be difficult to assess volume status given body habitus.  Lungs are without significant signs of pulmonary edema. ECHO consistent with Grade II diastolic dysfunction, EF 60-65%.  Continue diuresis on home medications - Continue Home Torsemide 60 mg BID, will hold PM dose  - Continue Strict I/Os - Fluid restriction 1200 mL, mild hyponatremic improving 134 AM  - Daily weights - Monitor BMP and Mag daily with diuresis  - Replete lytes as needed  - Continue home jardiance 25 mg  H1N1 influenza Patient remains afebrile and without significant symptoms. - Tamiflu (2/6 -2/10) completed 5 day course  - Bcx NGTD   Chronic and Stable Problems:  COPD: Continue home inhalers T2DM: Continue Heart Healthy Diet, will adjust insulin needs as appropriate Anemia: Continue iron supplementation HLD: Continue home atorvastatin Hypothyroidism: Continue home levothyroxine RLS: Continue home ropinerole  GERD: Continue pantoprazole Chronic pain: Pain well controlled currently  FEN/GI: Heart Healthy  PPx: Home Eliquis  Dispo: Approaching medical stability, inquiring return to ALF     Subjective:  Pt well this morning. No complaints. Would like to consider trach after discharge and return to facility.   Objective: Temp:  [97.8 F (36.6  C)-98 F (36.7 C)] 98 F (36.7 C) (02/13 0735) Pulse Rate:  [54-66] 61 (02/13 0735) Resp:  [15-20] 17 (02/13 0735) BP: (91-108)/(49-69) 91/58 (02/13 0735) SpO2:  [93 %-94 %] 94 % (02/13 0735) FiO2 (%):  [40 %] 40 % (02/13 0116) Weight:  [139.9 kg] 139.9 kg (02/13  0545) Physical Exam: General: A&O, no acute distress, Cardiovascular: Per tele: regular rate and rhythm in 70s Respiratory: Respiratory rate WNL, appeared comfortable on HF [place] , no increased work of breathing Abdomen: Nontender Extremities: Moves all 4 spontaneously and appropriately, chronic venous skin changes   Laboratory: Most recent CBC Lab Results  Component Value Date   WBC 9.6 11/13/2023   HGB 12.2 11/13/2023   HCT 39.2 11/13/2023   MCV 85.6 11/13/2023   PLT 306 11/13/2023   Most recent BMP    Latest Ref Rng & Units 11/14/2023    5:36 AM  BMP  Glucose 70 - 99 mg/dL 161   BUN 6 - 20 mg/dL 47   Creatinine 0.96 - 1.00 mg/dL 0.45   Sodium 409 - 811 mmol/L 134   Potassium 3.5 - 5.1 mmol/L 4.0   Chloride 98 - 111 mmol/L 82   CO2 22 - 32 mmol/L 33   Calcium 8.9 - 10.3 mg/dL 9.8    Mag 2.3  Imaging/Diagnostic Tests: None last 24 hours   Peterson Ao, MD 11/14/2023, 8:27 AM  PGY-1, [place] Family Medicine FPTS Intern pager: [phone], text pages welcome Secure chat group [facility] Teaching Service

## 2023-11-14 NOTE — Plan of Care (Signed)
  Problem: Education: Goal: Ability to describe self-care measures that may prevent or decrease complications (Diabetes Survival Skills Education) will improve Outcome: Progressing   Problem: Coping: Goal: Ability to adjust to condition or change in health will improve Outcome: Progressing

## 2023-11-14 NOTE — Assessment & Plan Note (Addendum)
Patient is awake and alert on 10L high flow nasal cannula this morning.  Oxygenating appropriately to 98%.  She denies any chest discomfort or wheezing. Pt would like to defer trach placement at this time and consider more once discharged. Wean oxygen as tolerated, with goal of satting well on 8L given facility requirements.  - Continuous pulse ox - Continue BiPAP while sleeping/naps, wean as tolerated to O2goal of 88-92% on at least 8L [place], patient previously stable on 5L   - NPO while on BiPAP  -Patient tolerating [place] while awake may have diet. -Patient has completed her course of CTX  - TOC, she can return to facility once medically stable, appreciate assistance

## 2023-11-14 NOTE — Assessment & Plan Note (Signed)
No episodes of atrial flutter overnight per EMR rates 50-70s. She denies palpitations this morning. Cardiology signed off and appreciate recs.   - Continue Toprol XL 50 mg daily - Continue home amiodarone 200 mg daily  - Restart Home Eliquis

## 2023-11-15 DIAGNOSIS — J9601 Acute respiratory failure with hypoxia: Secondary | ICD-10-CM | POA: Diagnosis not present

## 2023-11-15 LAB — BASIC METABOLIC PANEL
Anion gap: 14 (ref 5–15)
BUN: 60 mg/dL — ABNORMAL HIGH (ref 6–20)
CO2: 33 mmol/L — ABNORMAL HIGH (ref 22–32)
Calcium: 9.4 mg/dL (ref 8.9–10.3)
Chloride: 85 mmol/L — ABNORMAL LOW (ref 98–111)
Creatinine, Ser: 1.55 mg/dL — ABNORMAL HIGH (ref 0.44–1.00)
GFR, Estimated: 38 mL/min — ABNORMAL LOW (ref >60)
Glucose, Bld: 189 mg/dL — ABNORMAL HIGH (ref 70–99)
Potassium: 4 mmol/L (ref 3.5–5.1)
Sodium: 132 mmol/L — ABNORMAL LOW (ref 135–145)

## 2023-11-15 LAB — CBC
HCT: 40.7 % (ref 36.0–46.0)
Hemoglobin: 12.5 g/dL (ref 12.0–15.0)
MCH: 26.4 pg (ref 26.0–34.0)
MCHC: 30.7 g/dL (ref 30.0–36.0)
MCV: 86 fL (ref 80.0–100.0)
Platelets: 293 10*3/uL (ref 150–400)
RBC: 4.73 MIL/uL (ref 3.87–5.11)
RDW: 15.3 % (ref 11.5–15.5)
WBC: 9.7 10*3/uL (ref 4.0–10.5)
nRBC: 0 % (ref 0.0–0.2)

## 2023-11-15 LAB — GLUCOSE, CAPILLARY
Glucose-Capillary: 189 mg/dL — ABNORMAL HIGH (ref 70–99)
Glucose-Capillary: 214 mg/dL — ABNORMAL HIGH (ref 70–99)
Glucose-Capillary: 212 mg/dL — ABNORMAL HIGH (ref 70–99)
Glucose-Capillary: 194 mg/dL — ABNORMAL HIGH (ref 70–99)

## 2023-11-15 LAB — MAGNESIUM: Magnesium: 2.6 mg/dL — ABNORMAL HIGH (ref 1.7–2.4)

## 2023-11-15 MED ORDER — METOPROLOL SUCCINATE ER 50 MG PO TB24: 50.0000 mg | ORAL_TABLET | Freq: Every day | ORAL | Status: DC

## 2023-11-15 MED ORDER — INSULIN GLARGINE 100 UNIT/ML SOLOSTAR PEN: 15.0000 [IU] | PEN_INJECTOR | Freq: Every morning | SUBCUTANEOUS | Status: DC

## 2023-11-15 NOTE — TOC Progression Note (Signed)
Transition of Care [facility]) - Progression Note    Patient Details  Name: Chelsea [name] MRN: [id number] Date of Birth: [birth year]  Transition of Care [facility]) CM/SW Contact  Delilah Shan, LCSWA Phone Number: 11/15/2023, 11:21 AM  Clinical Narrative:     CSW spoke with Ferdinand Lango with [facility] who confirmed patient can return back today if medically ready. CSW informed MD. CSW will continue to follow.  Expected Discharge Plan: Skilled Nursing Facility Barriers to Discharge: Continued Medical Work up  Expected Discharge Plan and Services In-house Referral: Clinical Social Work     Living arrangements for the past 2 months: Skilled Nursing Facility                                       Social Determinants of Health (SDOH) Interventions SDOH Screenings   Food Insecurity: No Food Insecurity (11/04/2023)  Housing: Low Risk  (11/04/2023)  Transportation Needs: No Transportation Needs (11/04/2023)  Utilities: Not At Risk (11/04/2023)  Alcohol Screen: Low Risk  (07/05/2022)  Financial Resource Strain: Low Risk  (07/05/2022)  Physical Activity: Insufficiently Active (03/27/2021)   Received from [facility], [facility]  Social Connections: Unknown (02/04/2022)   Received from [facility], [facility]  Stress: No Stress Concern Present (09/15/2021)   Received from [facility], [facility]  Tobacco Use: Low Risk  (11/03/2023)    Readmission Risk Interventions    07/23/2022    2:19 PM 04/09/2022    4:17 PM  Readmission Risk Prevention Plan  Transportation Screening Complete Complete  PCP or Specialist Appt within 3-5 Days  Complete  HRI or Home Care Consult  Complete  Social Work Consult for Recovery Care Planning/Counseling  Complete  Palliative Care Screening  Not Applicable  Medication Review Oceanographer) Complete Referral to Pharmacy  PCP or Specialist appointment within 3-5 days of discharge Complete   HRI or Home Care Consult Complete   SW Recovery Care/Counseling  Consult Complete   Palliative Care Screening Not Applicable   Skilled Nursing Facility Complete

## 2023-11-15 NOTE — [place] FL2 (Signed)
[place] MEDICAID FL2 LEVEL OF CARE FORM     IDENTIFICATION  Patient Name: Chelsea [name] Birthdate: 1966/05/20 Sex: female Admission Date (Current Location): 11/03/2023  [facility] and [place] Number:  Producer, television/film/video and Address:  [facility], [address] [place], [place] [address]      Provider Number: [id number]  Attending Physician Name and Address:  Caro Laroche, DO  Relative Name and Phone Number:  Gerda Diss (significant other) [phone]    Current Level of Care: Hospital Recommended Level of Care: Skilled Nursing Facility Prior Approval Number:    Date Approved/Denied:   PASRR Number: [id number]  Discharge Plan: SNF    Current Diagnoses: Patient Active Problem List   Diagnosis Date Noted   Atrial fibrillation with RVR (HCC) 11/09/2023   H1N1 influenza 11/07/2023   HCAP (healthcare-associated pneumonia) 11/07/2023   Acute on chronic hypoxic respiratory failure (HCC) 11/04/2023   Acute hypoxemic respiratory failure (HCC) 11/03/2023   Acute on chronic respiratory failure with hypoxia and hypercapnia (HCC) 10/03/2023   Type 2 diabetes mellitus with hyperglycemia (HCC) 10/03/2023   Acute on chronic respiratory failure with hypercapnia (HCC) 10/03/2023   Altered mental status 09/29/2023   Abnormal CT scan of lung 09/22/2023   Urinary retention 09/20/2023   Acute hypoxic on chronic hypercapnic respiratory failure (HCC) 09/19/2023   Elevated serum creatinine 08/20/2023   Yeast infection 08/18/2023   Hypercapnemia 08/17/2023   T2DM (type 2 diabetes mellitus) (HCC) 08/16/2023   Acute respiratory failure with hypoxia (HCC) 08/15/2023   Pain of left middle finger 06/22/2023   Intertrigo 06/22/2023   Hypokalemia 06/15/2023   Hyperphosphatemia 06/04/2023   Anxiety 05/30/2023   Atrial flutter with rapid ventricular response (HCC) 05/29/2023   Acute kidney injury (HCC) 05/29/2023   Arthritis 11/13/2022   Asthma 11/13/2022    Chronic lower back pain 11/13/2022   Complication of anesthesia 11/13/2022   Coronary artery disease 11/13/2022   (HFpEF) heart failure with preserved ejection fraction (HCC) 11/13/2022   Heart murmur 11/13/2022   History of gout 11/13/2022   Hypertension 11/13/2022   Migraine 11/13/2022   On home oxygen therapy 11/13/2022   Chronic iron deficiency anemia 09/18/2022   Chronic disease anemia 08/01/2022   Upper GI bleed 07/18/2022   COPD (chronic obstructive pulmonary disease) with chronic bronchitis (HCC) 07/18/2022   Acute blood loss anemia 07/18/2022   Bleeding of the respiratory tract 04/07/2022   Chronic respiratory failure with hypercapnia (HCC) 02/12/2022   Polyneuropathy, unspecified 02/12/2022   Restrictive lung disease secondary to obesity 01/28/2022   Limitation of activity due to disability 09/18/2021   Paroxysmal atrial fibrillation (HCC) 09/04/2021   DJD (degenerative joint disease) 07/03/2021   Pressure injury of both heels, unstageable (HCC) 07/03/2021   Venous insufficiency of both lower extremities 07/02/2021   Physical deconditioning 04/24/2021   Depression 04/20/2021   History of tobacco use 04/20/2021   Hyperglycemia due to type 2 diabetes mellitus (HCC) 04/20/2021   Irritable bowel syndrome with constipation 04/20/2021   Microalbuminuria 04/20/2021   On supplemental oxygen by nasal cannula 04/20/2021   Renal insufficiency 04/20/2021   Moderate aortic stenosis 04/20/2021   Mild tricuspid regurgitation 04/02/2021   Mitral valve sclerosis 04/02/2021   Morbid obesity with BMI of 50.0-59.9, adult (HCC) 04/02/2021   Non-smoker 04/02/2021   Awaiting admission to adequate facility elsewhere 03/28/2021   Metabolic alkalosis 03/28/2021   Restless leg syndrome 03/22/2021   Weakness 03/20/2021   Hx of atrial flutter 03/15/2021  Weight loss 03/15/2021   Atrial flutter (HCC) 03/09/2021   S/P hysterectomy 02/19/2021   Pulmonary nodule less than 6 cm determined by  computed tomography of lung 02/09/2021   Dyspnea on exertion 02/09/2021   Nocturnal hypoxemia 06/03/2020   Mixed hyperlipidemia 04/18/2020   Hair loss 09/17/2019   Chronic pain of multiple joints 06/18/2019   Onychomycosis 06/18/2019   Acute idiopathic gout of left hand 02/16/2019   Tinea pedis of both feet 11/12/2018   Hypertension associated with diabetes (HCC) 08/25/2018   Class 3 severe obesity due to excess calories with serious comorbidity and body mass index (BMI) of 60.0 to 69.9 in adult (HCC) 08/25/2018   Physical debility 05/10/2018   Hepatitis C    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   OSA on CPAP 02/24/2018   Acute on chronic congestive heart failure (HCC) 09/25/2017   Pneumonia due to gram-positive bacteria 08/24/2017   Acute respiratory failure with hypoxia and hypercarbia (HCC) 05/27/2016   Hyperlipidemia 09/28/2015   COPD exacerbation (HCC) 03/04/2015   Bronchitis 05/30/2011   Neuropathic pain, leg 04/09/2011   Mononeuropathy of lower extremity 04/09/2011   Edema 03/22/2011   History of cervical cancer 03/22/2011   Lymphedema 03/22/2011   Obesity hypoventilation syndrome (HCC) 01/01/2011   Acute on chronic diastolic heart failure (HCC) 11/28/2010   Osteoarthritis 11/28/2010   Hypothyroidism 11/13/2010   Essential hypertension 11/10/2010   GERD 11/10/2010   Gastro-esophageal reflux disease without esophagitis 11/10/2010   Essential (primary) hypertension 11/10/2010   Type 2 diabetes mellitus without complication (HCC) 11/10/2010   Cervical cancer (HCC) 2006    Orientation RESPIRATION BLADDER Height & Weight     Self, Time, Situation, Place    Incontinent Weight: (!) 308 lb 6.8 oz (139.9 kg) Height:  5\' 5"  (165.1 cm)  BEHAVIORAL SYMPTOMS/MOOD NEUROLOGICAL BOWEL NUTRITION STATUS      Continent Diet (Please see discharge summary)  AMBULATORY STATUS COMMUNICATION OF NEEDS Skin     Verbally Other (Comment)  (Ecchymosis,arm,Bil.,Erythema,Leg,Foot,Bil.,Wound/Incision LDAs)                       Personal Care Assistance Level of Assistance    Bathing Assistance: Maximum assistance Feeding assistance: Limited assistance Dressing Assistance: Maximum assistance     Functional Limitations Info  Sight, Hearing, Speech Sight Info: Adequate Hearing Info: Adequate Speech Info: Adequate    SPECIAL CARE FACTORS FREQUENCY                       Contractures Contractures Info: Not present    Additional Factors Info  Code Status, Allergies, Insulin Sliding Scale, Isolation Precautions Code Status Info: FULL Allergies Info: Iodinated Contrast Media,Penicillins,Insulin Lispro,Minoxidil Psychotropic Info: buPROPion ER (WELLBUTRIN SR) 12 hr tablet 100 mg 2 times daily,busPIRone (BUSPAR) tablet 7.5 mg 2 times daily Insulin Sliding Scale Info: insulin aspart (novoLOG) injection 0-15 Units 3 times daily with meals Isolation Precautions Info: MRSA     Current Medications (11/15/2023):  This is the current hospital active medication list Current Facility-Administered Medications  Medication Dose Route Frequency Provider Last Rate Last Admin   acetaminophen (TYLENOL) tablet 1,000 mg  1,000 mg Oral Q6H PRN Lincoln Brigham, MD   1,000 mg at 11/14/23 1638   albuterol (PROVENTIL) (2.5 MG/3ML) 0.083% nebulizer solution 2.5 mg  2.5 mg Nebulization Q6H PRN Westley Chandler, MD   2.5 mg at 11/08/23 0350   amiodarone (PACERONE) tablet 200 mg  200 mg Oral Daily  Perlie Gold, PA-C   200 mg at 11/15/23 0944   apixaban (ELIQUIS) tablet 5 mg  5 mg Oral BID Vonna Drafts, MD   5 mg at 11/15/23 0944   atorvastatin (LIPITOR) tablet 20 mg  20 mg Oral QPM Baloch, Mahnoor, MD   20 mg at 11/14/23 1638   buPROPion ER (WELLBUTRIN SR) 12 hr tablet 100 mg  100 mg Oral BID Baloch, Mahnoor, MD   100 mg at 11/15/23 0944   busPIRone (BUSPAR) tablet 7.5 mg  7.5 mg Oral BID Baloch, Mahnoor, MD   7.5 mg at 11/15/23 0945    Chlorhexidine Gluconate Cloth 2 % PADS 6 each  6 each Topical Daily Nestor Ramp, MD   6 each at 11/14/23 1610   diclofenac Sodium (VOLTAREN) 1 % topical gel 2 g  2 g Topical QID PRN Lincoln Brigham, MD   2 g at 11/05/23 2311   empagliflozin (JARDIANCE) tablet 25 mg  25 mg Oral Daily Gomes, Adriana, DO   25 mg at 11/15/23 0944   ferrous sulfate tablet 325 mg  325 mg Oral Daily Baloch, Mahnoor, MD   325 mg at 11/15/23 0944   fluticasone furoate-vilanterol (BREO ELLIPTA) 100-25 MCG/ACT 1 puff  1 puff Inhalation Daily Baloch, Mahnoor, MD   1 puff at 11/07/23 0858   And   umeclidinium bromide (INCRUSE ELLIPTA) 62.5 MCG/ACT 1 puff  1 puff Inhalation Daily Baloch, Mahnoor, MD   1 puff at 11/07/23 0900   guaiFENesin-dextromethorphan (ROBITUSSIN DM) 100-10 MG/5ML syrup 5 mL  5 mL Oral Q4H PRN Jerald Kief, MD   5 mL at 11/09/23 2215   insulin aspart (novoLOG) injection 0-15 Units  0-15 Units Subcutaneous TID WC Baloch, Mahnoor, MD   5 Units at 11/15/23 1218   levothyroxine (SYNTHROID) tablet 150 mcg  150 mcg Oral Q0600 Georg Ruddle, Mahnoor, MD   150 mcg at 11/15/23 0601   loperamide (IMODIUM) capsule 2 mg  2 mg Oral PRN Peterson Ao, MD   2 mg at 11/15/23 0948   loratadine (CLARITIN) tablet 10 mg  10 mg Oral Daily Baloch, Mahnoor, MD   10 mg at 11/15/23 0944   melatonin tablet 3 mg  3 mg Oral QHS PRN Lincoln Brigham, MD   3 mg at 11/14/23 2135   metoprolol succinate (TOPROL-XL) 24 hr tablet 50 mg  50 mg Oral Daily Perlie Gold, PA-C   50 mg at 11/15/23 0944   montelukast (SINGULAIR) tablet 10 mg  10 mg Oral QHS Baloch, Mahnoor, MD   10 mg at 11/14/23 2134   Oral care mouth rinse  15 mL Mouth Rinse 4 times per day Nestor Ramp, MD   15 mL at 11/14/23 2137   Oral care mouth rinse  15 mL Mouth Rinse PRN Nestor Ramp, MD       pantoprazole (PROTONIX) EC tablet 40 mg  40 mg Oral Daily Baloch, Mahnoor, MD   40 mg at 11/15/23 0944   rOPINIRole (REQUIP) tablet 0.5 mg  0.5 mg Oral QHS Fortunato Curling, DO   0.5 mg at  11/14/23 2134   sodium chloride flush (NS) 0.9 % injection 10-40 mL  10-40 mL Intracatheter PRN Westley Chandler, MD   10 mL at 11/05/23 2323   sodium chloride flush (NS) 0.9 % injection 10-40 mL  10-40 mL Intracatheter Q12H Nestor Ramp, MD   10 mL at 11/14/23 2138   sodium chloride flush (NS) 0.9 % injection 10-40 mL  10-40 mL Intracatheter  PRN Nestor Ramp, MD       torsemide [facility]) tablet 60 mg  60 mg Oral BID Vonna Drafts, MD   60 mg at 11/15/23 5621     Discharge Medications: Please see discharge summary for a list of discharge medications.  Relevant Imaging Results:  Relevant Lab Results:   Additional Information [id number]  Delilah Shan, LCSWA

## 2023-11-15 NOTE — Discharge Summary (Addendum)
Family Medicine Teaching [facility] Discharge Summary  Patient name: Chelsea [name] Medical record number: [id number] Date of birth: 1966/07/18 Age: 58 y.o. Gender: female Date of Admission: 11/03/2023  Date of Discharge: 11/15/2023  Admitting Physician: Westley Chandler, MD  Primary Care Provider: Pcp, No Consultants: ENT, Cardiology, Pulmonology   Indication for Hospitalization: Acute hypoxemic respiratory failure   Discharge Diagnoses/Problem List:  Principal Problem for Admission: Acute Hypoxemic respiratory failure  Other Problems addressed during stay:  Principal Problem:   Acute hypoxemic respiratory failure (HCC) Active Problems:   Acute respiratory failure with hypoxia and hypercarbia (HCC)   (HFpEF) heart failure with preserved ejection fraction (HCC)   COPD exacerbation (HCC)   Atrial flutter (HCC)   Acute on chronic congestive heart failure (HCC)   Acute kidney injury (HCC)   Acute respiratory failure with hypoxia (HCC)   Altered mental status   Acute on chronic hypoxic respiratory failure (HCC)   H1N1 influenza   HCAP (healthcare-associated pneumonia)   Atrial fibrillation with RVR [facility])   Brief Hospital Course:  Chelsea [name] is a 58 y.o. female who was admitted to the [facility] Teaching Service at [facility] for acute on chronic respiratory failure with hypoxia and hypercarbia. Hospital course is outlined below by problem.   Acute Respiratory Failure with hypoxia and hypercarbia Patient presented to the hospital from her ALF after being found off of her oxygen and was reported to be hypoxic to the 70s.  She was placed on nonrebreather in the emergency department with improvement, and transition to high flow nasal cannula.  VBGs showed pCO2 of 65, white count elevated to 13.4 and a normal lactic acid level.  Chest x-ray showed heterogenous bilateral opacities suspicious for pneumonia, patient was empirically treated with ceftriaxone and azithromycin,  Solu-Medrol, mag sulfate and given 40 mg IV Lasix given her history of HFpEF.  On admission she was started on BiPAP for goal oxygen saturation of 88 to 92%.  Given her somnolence any CNS acting medications were withheld.  Ceftriaxone and azithromycin were continued she completed a 5 and 3-day course respectively.  Her mentation improved on BiPAP and patient was counseled that she would need to remain on BiPAP while asleep to avoid further strain on her body.  Strep urine antigen, Legionella and MRSA swabs were elected and were all negative.  Patient improved slowly over the course of her hospitalization and was generally compliant with BiPAP.  Did fever overnight on 2/5 and antibiotics were temporarily broadened to vancomycin and cefepime, however patient had RVP positive for H1 N1 and vancomycin and cefepime were discontinued.  Given patient's longstanding history of recurrent respiratory failure with hypoxia and hypercarbia object of tracheostomy was brought up again and patient was initially willing to discuss with pulmonology and ENT, however after discussion with her spouse they decided against it at this time and would consider further after discharge.   HFpEF Patient was continued on her home medications and dosed with IV Lasix 40 mg daily initially, however and her worsening respiratory status, this was advanced to 80 mg of IV Lasix followed by 80 mg of IV Lasix to twice daily.  Home metolazone was added back on briefly, however was discontinued due to mild bump in creatinine. Patient resumed her home regimen of Torsemide 60 mg BID prior to discharge. Home Jardiance 25 mg daily was restarted. Overall patient was diuresed ~18 L over the course of her hospitalization.  Atrial Flutter with RVR During admission patient was found  to have worsening respiratory status and increased oxygen requirement, was found to be in atrial flutter with RVR.  Neurology was consulted and the patient was started on a  diltiazem drip.  At that time her home amiodarone 200 mg was continued, but was later discontinued per cardiology in favor of initiating amiodarone drip.  Patient remained tachycardic to the 130s to 140s despite additional Amio bolus, and patient was given 0.25 mg IV digoxin.  Rate reduced to 100s to 110s, patient remained in a flutter with RVR.  Patient's rate better controlled on 2/8, and dilt drip was weaned accordingly. Patient was not able to tolerate discontinuation of diltiazem drip and it was restarted on 2/9. ECHO was ordered given concern for heart strain and showed Grade II diastolic dysfunction. EF was 60-65%.   Patients medication at discharge was  Amiodarone 200 mg daily and Toprol XL 50 mg daily   H1N1 Influenza Diagnosed on 2/6 and treated with 5 day course of tamiflu and supportive care.   Other conditions that were chronic and stable managed with home medications or appropriate formulary alternatives.  Issues for follow up: Recheck BMP outpatient, monitoring Creatinine and Potassium  Ensure follow-up with Pulmonology     Disposition: ALF   Discharge Condition: Improved    Discharge Exam:  Vitals:   11/15/23 0749 11/15/23 0938  BP:  97/61  Pulse: 64 68  Resp: 19 19  Temp:  97.6 F (36.4 C)  SpO2: 95% 90%   General: A&O, no acute distress, Cardiovascular: Per tele: regular rate and rhythm in 70s Respiratory: Respiratory rate WNL, appeared comfortable on HF 5L [place], no increased work of breathing Abdomen: Nontender Extremities: Moves all 4 spontaneously and appropriately, chronic venous skin changes  Significant Procedures: None  Significant Labs and Imaging:  Recent Labs  Lab 11/15/23 0600  WBC 9.7  HGB 12.5  HCT 40.7  PLT 293   Recent Labs  Lab 11/14/23 0536 11/15/23 0600  NA 134* 132*  K 4.0 4.0  CL 82* 85*  CO2 33* 33*  GLUCOSE 177* 189*  BUN 47* 60*  CREATININE 1.61* 1.55*  CALCIUM 9.8 9.4  MG 2.3 2.6*    Pertinent Imaging    CXR 2/2    Enlarged cardiomediastinal silhouette with central congestion. Heterogeneous bilateral opacities right greater than left, the suspect infection over edema.  CXR 2/5  Cardiomegaly and pulmonary venous hypertension. Slight worsening of patchy density in both lower lobes that could be atelectasis or mild pneumonia.  Results/Tests Pending at Time of Discharge: None   Discharge Medications:  Allergies as of 11/15/2023       Reactions   Iodinated Contrast Media Anaphylaxis   Penicillins Anaphylaxis   Patient tolerates cefepime (08/2017)   Insulin Lispro Other (See Comments)   Adder per MAR from SNF   Minoxidil Hives        Medication List     STOP taking these medications    insulin glargine-yfgn 100 UNIT/ML injection Commonly known as: SEMGLEE       TAKE these medications    albuterol (2.5 MG/3ML) 0.083% nebulizer solution Commonly known as: PROVENTIL Take 3 mLs (2.5 mg total) by nebulization every 6 (six) hours as needed for wheezing or shortness of breath (I50.32).   ALPRAZolam 0.5 MG tablet Commonly known as: XANAX Take 0.5 mg by mouth 2 (two) times daily as needed for anxiety.   amiodarone 200 MG tablet Commonly known as: PACERONE Take 1 tablet (200 mg total) by mouth daily.  atorvastatin 20 MG tablet Commonly known as: LIPITOR Take 20 mg by mouth every evening.   buPROPion ER 100 MG 12 hr tablet Commonly known as: WELLBUTRIN SR Take 1 tablet (100 mg total) by mouth 2 (two) times daily.   busPIRone 7.5 MG tablet Commonly known as: BUSPAR Take 1 tablet (7.5 mg total) by mouth 2 (two) times daily.   Cholecalciferol 125 MCG (5000 UT) Tabs Take 1 tablet (5,000 Units total) by mouth daily.   Eliquis 5 MG Tabs tablet Generic drug: apixaban Take 5 mg by mouth 2 (two) times daily.   empagliflozin 25 MG Tabs tablet Commonly known as: JARDIANCE Take 1 tablet (25 mg total) by mouth daily.   ferrous sulfate 325 (65 FE) MG EC tablet Take 325 mg by  mouth daily.   insulin glargine 100 UNIT/ML Solostar Pen Commonly known as: LANTUS Inject 15 Units into the skin in the morning. What changed: how much to take   levothyroxine 150 MCG tablet Commonly known as: SYNTHROID Take 1 tablet (150 mcg total) by mouth daily at 6 (six) AM. What changed: when to take this   loperamide 2 MG capsule Commonly known as: IMODIUM Take 2 mg by mouth every 4 (four) hours as needed for diarrhea or loose stools.   loratadine 10 MG tablet Commonly known as: CLARITIN Take 1 tablet (10 mg total) by mouth daily.   metoprolol succinate 50 MG 24 hr tablet Commonly known as: TOPROL-XL Take 1 tablet (50 mg total) by mouth daily. Take with or immediately following a meal.   montelukast 10 MG tablet Commonly known as: SINGULAIR Take 10 mg by mouth at bedtime.   multivitamin with minerals Tabs tablet Take 1 tablet by mouth daily.   NON FORMULARY Apply BIPAP every night at bedtime.   omeprazole 20 MG capsule Commonly known as: PRILOSEC Take 20 mg by mouth daily.   oxyCODONE-acetaminophen 10-325 MG tablet Commonly known as: PERCOCET Take 1 tablet by mouth every 6 (six) hours.   OXYGEN Inhale 5 L/min into the lungs at bedtime. Connect to BIPAP qhs   potassium chloride SA 20 MEQ tablet Commonly known as: KLOR-CON M Take 2 tablets (40 mEq total) by mouth daily.   rOPINIRole 0.5 MG tablet Commonly known as: REQUIP Take 1 tablet (0.5 mg total) by mouth at bedtime.   torsemide 20 MG tablet Commonly known as: DEMADEX Take 60 mg by mouth 2 (two) times daily.   Trelegy Ellipta 100-62.5-25 MCG/ACT Aepb Generic drug: Fluticasone-Umeclidin-Vilant Inhale 1 puff into the lungs daily.   Trulicity 1.5 MG/0.5ML Soaj Generic drug: Dulaglutide Inject 1.5 mg into the skin every Saturday.   Voltaren 1 % Gel Generic drug: diclofenac Sodium Apply 2-4 g topically in the morning and at bedtime. Apply to bilateral knees/L shoulder topically twice a day for  arthritis 4g dose lower extremities, 2g dose upper extremities        Discharge Instructions: Please refer to Patient Instructions section of EMR for full details.  Patient was counseled important signs and symptoms that should prompt return to medical care, changes in medications, dietary instructions, activity restrictions, and follow up appointments.   Follow-Up Appointments:     Peterson Ao, MD 11/15/2023, 3:22 PM PGY-1, [facility]   FPTS Upper-Level Resident Addendum    I have discussed the above with Dr. Birdena Crandall and agree with the documented plan. My edits for correction/addition/clarification are included above. Please see any attending notes.   Vonna Drafts, MD PGY-2, [facility] 11/15/2023 3:30 PM  FPTS Service pager: [phone] (text pages welcome through [facility])

## 2023-11-15 NOTE — Progress Notes (Addendum)
Called report Tiffany LPN, [facility]

## 2023-11-15 NOTE — Plan of Care (Signed)
  Problem: Nutritional: Goal: Maintenance of adequate nutrition will improve Outcome: Progressing Goal: Progress toward achieving an optimal weight will improve Outcome: Progressing   Problem: Clinical Measurements: Goal: Respiratory complications will improve Outcome: Progressing Goal: Cardiovascular complication will be avoided Outcome: Progressing   Problem: Coping: Goal: Level of anxiety will decrease Outcome: Progressing   Problem: Pain Managment: Goal: General experience of comfort will improve and/or be controlled Outcome: Progressing   Problem: Safety: Goal: Ability to remain free from injury will improve Outcome: Progressing

## 2023-11-15 NOTE — Progress Notes (Signed)
   11/14/23 2300  BiPAP/CPAP/SIPAP  BiPAP/CPAP/SIPAP Pt Type Adult  BiPAP/CPAP/SIPAP SERVO   Bipap in room will call when ready

## 2023-11-15 NOTE — TOC Transition Note (Addendum)
Transition of Care [facility]) - Discharge Note   Patient Details  Name: Chelsea [name] MRN: [id number] Date of Birth: Feb 01, 1966  Transition of Care [facility]) CM/SW Contact:  Delilah Shan, LCSWA Phone Number: 11/15/2023, 1:46 PM   Clinical Narrative:     Patient will DC to: [facility]  Anticipated DC date: 11/16/2023  Family notified: Dewayne   Transport by: Sharin Mons  ?  Per MD patient ready for DC to [facility] . RN, patient, patient's family, and facility notified of DC.Patient has Bipap at facility. Discharge Summary sent to facility. RN given number for report tele# [phone]  ask for [facility] RM# [id number]. DC packet on chart. Ambulance transport requested for patient.  CSW signing off.   Final next level of care: Skilled Nursing Facility Barriers to Discharge: No Barriers Identified   Patient Goals and CMS Choice Patient states their goals for this hospitalization and ongoing recovery are:: SNF CMS Medicare.gov Compare Post Acute Care list provided to:: Patient Choice offered to / list presented to : Patient      Discharge Placement              Patient chooses bed at: [facility] Patient to be transferred to facility by: [facility] Name of family member notified: Dewayne Patient and family notified of of transfer: 11/15/23  Discharge Plan and Services Additional resources added to the After Visit Summary for   In-house Referral: Clinical Social Work                                   Social Drivers of Health (SDOH) Interventions SDOH Screenings   Food Insecurity: No Food Insecurity (11/04/2023)  Housing: Low Risk  (11/04/2023)  Transportation Needs: No Transportation Needs (11/04/2023)  Utilities: Not At Risk (11/04/2023)  Alcohol Screen: Low Risk  (07/05/2022)  Financial Resource Strain: Low Risk  (07/05/2022)  Physical Activity: Insufficiently Active (03/27/2021)   Received from [facility]  Social Connections: Unknown (02/04/2022)   Received from [facility]  Stress: No Stress Concern Present (09/15/2021)   Received from [facility]  Tobacco Use: Low Risk  (11/03/2023)     Readmission Risk Interventions    07/23/2022    2:19 PM 04/09/2022    4:17 PM  Readmission Risk Prevention Plan  Transportation Screening Complete Complete  PCP or Specialist Appt within 3-5 Days  Complete  HRI or Home Care Consult  Complete  Social Work Consult for Recovery Care Planning/Counseling  Complete  Palliative Care Screening  Not Applicable  Medication Review [facility]) Complete Referral to Pharmacy  PCP or Specialist appointment within 3-5 days of discharge Complete   HRI or Home Care Consult Complete   SW Recovery Care/Counseling Consult Complete   Palliative Care Screening Not Applicable   Skilled Nursing Facility Complete

## 2023-11-16 NOTE — Progress Notes (Signed)
Patient is being discharged to [facility] via [facility] transport. Patient is alert and oriented, vitals signs stable on 5L Nasal cannula. Discharge documents were handed to [facility] personnel.

## 2023-11-22 ENCOUNTER — Emergency Department (HOSPITAL_COMMUNITY): Payer: Medicare Other

## 2023-11-22 ENCOUNTER — Other Ambulatory Visit: Payer: Self-pay

## 2023-11-22 ENCOUNTER — Inpatient Hospital Stay (HOSPITAL_COMMUNITY)
Admission: EM | Admit: 2023-11-22 | Discharge: 2023-11-26 | DRG: 291 | Disposition: A | Discharge status: Skilled Nursing Facility | Payer: Medicare Other | Source: Skilled Nursing Facility | Attending: Family Medicine | Admitting: Family Medicine

## 2023-11-22 ENCOUNTER — Encounter (HOSPITAL_COMMUNITY): Payer: Self-pay | Admitting: Emergency Medicine

## 2023-11-22 DIAGNOSIS — I4892 Unspecified atrial flutter: Secondary | ICD-10-CM | POA: Diagnosis present

## 2023-11-22 DIAGNOSIS — I890 Lymphedema, not elsewhere classified: Secondary | ICD-10-CM | POA: Diagnosis present

## 2023-11-22 DIAGNOSIS — I4891 Unspecified atrial fibrillation: Secondary | ICD-10-CM | POA: Diagnosis present

## 2023-11-22 DIAGNOSIS — I878 Other specified disorders of veins: Secondary | ICD-10-CM | POA: Diagnosis present

## 2023-11-22 DIAGNOSIS — Z6841 Body Mass Index (BMI) 40.0 and over, adult: Secondary | ICD-10-CM | POA: Diagnosis not present

## 2023-11-22 DIAGNOSIS — E119 Type 2 diabetes mellitus without complications: Secondary | ICD-10-CM

## 2023-11-22 DIAGNOSIS — J984 Other disorders of lung: Secondary | ICD-10-CM | POA: Diagnosis not present

## 2023-11-22 DIAGNOSIS — Z7985 Long-term (current) use of injectable non-insulin antidiabetic drugs: Secondary | ICD-10-CM

## 2023-11-22 DIAGNOSIS — Z90710 Acquired absence of both cervix and uterus: Secondary | ICD-10-CM

## 2023-11-22 DIAGNOSIS — Z7901 Long term (current) use of anticoagulants: Secondary | ICD-10-CM

## 2023-11-22 DIAGNOSIS — J9612 Chronic respiratory failure with hypercapnia: Secondary | ICD-10-CM | POA: Diagnosis present

## 2023-11-22 DIAGNOSIS — Z79899 Other long term (current) drug therapy: Secondary | ICD-10-CM

## 2023-11-22 DIAGNOSIS — G2581 Restless legs syndrome: Secondary | ICD-10-CM | POA: Diagnosis present

## 2023-11-22 DIAGNOSIS — G8929 Other chronic pain: Secondary | ICD-10-CM | POA: Diagnosis present

## 2023-11-22 DIAGNOSIS — G4733 Obstructive sleep apnea (adult) (pediatric): Secondary | ICD-10-CM | POA: Diagnosis not present

## 2023-11-22 DIAGNOSIS — E669 Obesity, unspecified: Secondary | ICD-10-CM | POA: Diagnosis present

## 2023-11-22 DIAGNOSIS — E662 Morbid (severe) obesity with alveolar hypoventilation: Secondary | ICD-10-CM | POA: Diagnosis present

## 2023-11-22 DIAGNOSIS — E66813 Obesity, class 3: Secondary | ICD-10-CM | POA: Diagnosis not present

## 2023-11-22 DIAGNOSIS — E785 Hyperlipidemia, unspecified: Secondary | ICD-10-CM | POA: Diagnosis present

## 2023-11-22 DIAGNOSIS — J441 Chronic obstructive pulmonary disease with (acute) exacerbation: Secondary | ICD-10-CM | POA: Diagnosis present

## 2023-11-22 DIAGNOSIS — I739 Peripheral vascular disease, unspecified: Secondary | ICD-10-CM | POA: Diagnosis present

## 2023-11-22 DIAGNOSIS — Z9981 Dependence on supplemental oxygen: Secondary | ICD-10-CM

## 2023-11-22 DIAGNOSIS — Z8701 Personal history of pneumonia (recurrent): Secondary | ICD-10-CM

## 2023-11-22 DIAGNOSIS — I5030 Unspecified diastolic (congestive) heart failure: Secondary | ICD-10-CM | POA: Diagnosis present

## 2023-11-22 DIAGNOSIS — I350 Nonrheumatic aortic (valve) stenosis: Secondary | ICD-10-CM | POA: Diagnosis present

## 2023-11-22 DIAGNOSIS — E1140 Type 2 diabetes mellitus with diabetic neuropathy, unspecified: Secondary | ICD-10-CM | POA: Diagnosis present

## 2023-11-22 DIAGNOSIS — Z794 Long term (current) use of insulin: Secondary | ICD-10-CM | POA: Diagnosis not present

## 2023-11-22 DIAGNOSIS — Z736 Limitation of activities due to disability: Secondary | ICD-10-CM | POA: Diagnosis present

## 2023-11-22 DIAGNOSIS — E039 Hypothyroidism, unspecified: Secondary | ICD-10-CM | POA: Diagnosis present

## 2023-11-22 DIAGNOSIS — J4489 Other specified chronic obstructive pulmonary disease: Secondary | ICD-10-CM | POA: Diagnosis present

## 2023-11-22 DIAGNOSIS — I872 Venous insufficiency (chronic) (peripheral): Secondary | ICD-10-CM | POA: Diagnosis present

## 2023-11-22 DIAGNOSIS — J9621 Acute and chronic respiratory failure with hypoxia: Principal | ICD-10-CM | POA: Diagnosis present

## 2023-11-22 DIAGNOSIS — I110 Hypertensive heart disease with heart failure: Secondary | ICD-10-CM | POA: Diagnosis present

## 2023-11-22 DIAGNOSIS — K219 Gastro-esophageal reflux disease without esophagitis: Secondary | ICD-10-CM | POA: Diagnosis present

## 2023-11-22 DIAGNOSIS — Z7984 Long term (current) use of oral hypoglycemic drugs: Secondary | ICD-10-CM

## 2023-11-22 DIAGNOSIS — J9622 Acute and chronic respiratory failure with hypercapnia: Secondary | ICD-10-CM | POA: Diagnosis present

## 2023-11-22 DIAGNOSIS — I5033 Acute on chronic diastolic (congestive) heart failure: Secondary | ICD-10-CM | POA: Diagnosis present

## 2023-11-22 DIAGNOSIS — Z79890 Hormone replacement therapy (postmenopausal): Secondary | ICD-10-CM

## 2023-11-22 DIAGNOSIS — D509 Iron deficiency anemia, unspecified: Secondary | ICD-10-CM | POA: Diagnosis present

## 2023-11-22 LAB — CBC WITH DIFFERENTIAL/PLATELET
Abs Immature Granulocytes: 0.09 10*3/uL — ABNORMAL HIGH (ref 0.00–0.07)
Basophils Absolute: 0.1 10*3/uL (ref 0.0–0.1)
Basophils Relative: 1 %
Eosinophils Absolute: 0 10*3/uL (ref 0.0–0.5)
Eosinophils Relative: 0 %
HCT: 40.6 % (ref 36.0–46.0)
Hemoglobin: 11.7 g/dL — ABNORMAL LOW (ref 12.0–15.0)
Immature Granulocytes: 1 %
Lymphocytes Relative: 10 %
Lymphs Abs: 1 10*3/uL (ref 0.7–4.0)
MCH: 26.6 pg (ref 26.0–34.0)
MCHC: 28.8 g/dL — ABNORMAL LOW (ref 30.0–36.0)
MCV: 92.3 fL (ref 80.0–100.0)
Monocytes Absolute: 0.8 10*3/uL (ref 0.1–1.0)
Monocytes Relative: 8 %
Neutro Abs: 8.6 10*3/uL — ABNORMAL HIGH (ref 1.7–7.7)
Neutrophils Relative %: 80 %
Platelets: 312 10*3/uL (ref 150–400)
RBC: 4.4 MIL/uL (ref 3.87–5.11)
RDW: 15.6 % — ABNORMAL HIGH (ref 11.5–15.5)
WBC: 10.6 10*3/uL — ABNORMAL HIGH (ref 4.0–10.5)
nRBC: 0.2 % (ref 0.0–0.2)

## 2023-11-22 LAB — COMPREHENSIVE METABOLIC PANEL
ALT: 16 U/L (ref 0–44)
AST: 18 U/L (ref 15–41)
Albumin: 3.1 g/dL — ABNORMAL LOW (ref 3.5–5.0)
Alkaline Phosphatase: 101 U/L (ref 38–126)
Anion gap: 12 (ref 5–15)
BUN: 33 mg/dL — ABNORMAL HIGH (ref 6–20)
CO2: 35 mmol/L — ABNORMAL HIGH (ref 22–32)
Calcium: 9 mg/dL (ref 8.9–10.3)
Chloride: 91 mmol/L — ABNORMAL LOW (ref 98–111)
Creatinine, Ser: 0.79 mg/dL (ref 0.44–1.00)
GFR, Estimated: >60 mL/min (ref >60)
Glucose, Bld: 191 mg/dL — ABNORMAL HIGH (ref 70–99)
Potassium: 5 mmol/L (ref 3.5–5.1)
Sodium: 138 mmol/L (ref 135–145)
Total Bilirubin: 0.2 mg/dL (ref 0.0–1.2)
Total Protein: 7.6 g/dL (ref 6.5–8.1)

## 2023-11-22 LAB — URINALYSIS, ROUTINE W REFLEX MICROSCOPIC
Bacteria, UA: NONE SEEN
Bilirubin Urine: NEGATIVE
Glucose, UA: >=500 mg/dL — AB
Hgb urine dipstick: NEGATIVE
Ketones, ur: NEGATIVE mg/dL
Leukocytes,Ua: NEGATIVE
Nitrite: NEGATIVE
Protein, ur: NEGATIVE mg/dL
Specific Gravity, Urine: 1.015 (ref 1.005–1.030)
pH: 6 (ref 5.0–8.0)

## 2023-11-22 LAB — I-STAT VENOUS BLOOD GAS, ED
Acid-Base Excess: 11 mmol/L — ABNORMAL HIGH (ref 0.0–2.0)
Bicarbonate: 41.4 mmol/L — ABNORMAL HIGH (ref 20.0–28.0)
Calcium, Ion: 1.14 mmol/L — ABNORMAL LOW (ref 1.15–1.40)
HCT: 37 % (ref 36.0–46.0)
Hemoglobin: 12.6 g/dL (ref 12.0–15.0)
O2 Saturation: 84 %
Potassium: 4.6 mmol/L (ref 3.5–5.1)
Sodium: 138 mmol/L (ref 135–145)
TCO2: 44 mmol/L — ABNORMAL HIGH (ref 22–32)
pCO2, Ven: 91.1 mm[Hg] (ref 44–60)
pH, Ven: 7.265 (ref 7.25–7.43)
pO2, Ven: 60 mm[Hg] — ABNORMAL HIGH (ref 32–45)

## 2023-11-22 LAB — TROPONIN I (HIGH SENSITIVITY)
Troponin I (High Sensitivity): 7 ng/L (ref <18)
Troponin I (High Sensitivity): 8 ng/L (ref <18)

## 2023-11-22 LAB — GLUCOSE, CAPILLARY: Glucose-Capillary: 271 mg/dL — ABNORMAL HIGH (ref 70–99)

## 2023-11-22 LAB — RESP PANEL BY RT-PCR (RSV, FLU A&B, COVID)  RVPGX2
Influenza A by PCR: NEGATIVE
Influenza B by PCR: NEGATIVE
Resp Syncytial Virus by PCR: NEGATIVE
SARS Coronavirus 2 by RT PCR: NEGATIVE

## 2023-11-22 LAB — BRAIN NATRIURETIC PEPTIDE: B Natriuretic Peptide: 348.6 pg/mL — ABNORMAL HIGH (ref 0.0–100.0)

## 2023-11-22 LAB — I-STAT CG4 LACTIC ACID, ED: Lactic Acid, Venous: 1.9 mmol/L (ref 0.5–1.9)

## 2023-11-22 MED ORDER — UMECLIDINIUM BROMIDE 62.5 MCG/ACT IN AEPB
1.0000 | INHALATION_SPRAY | Freq: Every day | RESPIRATORY_TRACT | Status: DC
  Filled 2023-11-22: qty 7

## 2023-11-22 MED ORDER — LEVOTHYROXINE SODIUM 75 MCG PO TABS: 150.0000 ug | ORAL_TABLET | Freq: Every day | ORAL | Status: DC

## 2023-11-22 MED ORDER — FUROSEMIDE 10 MG/ML IJ SOLN
40.0000 mg | Freq: Once | INTRAMUSCULAR | Status: AC
  Administered 2023-11-22: 40 mg via INTRAVENOUS
  Filled 2023-11-22: qty 4

## 2023-11-22 MED ORDER — FERROUS SULFATE 325 (65 FE) MG PO TABS
325.0000 mg | ORAL_TABLET | Freq: Every morning | ORAL | Status: DC
  Administered 2023-11-23 – 2023-11-26 (×4): 325 mg via ORAL
  Filled 2023-11-22 (×4): qty 1

## 2023-11-22 MED ORDER — PANTOPRAZOLE SODIUM 40 MG PO TBEC
40.0000 mg | DELAYED_RELEASE_TABLET | Freq: Every day | ORAL | Status: DC
  Administered 2023-11-23 – 2023-11-26 (×4): 40 mg via ORAL
  Filled 2023-11-22 (×4): qty 1

## 2023-11-22 MED ORDER — AMIODARONE HCL 200 MG PO TABS
200.0000 mg | ORAL_TABLET | Freq: Every day | ORAL | Status: DC
  Administered 2023-11-23 – 2023-11-26 (×4): 200 mg via ORAL
  Filled 2023-11-22 (×3): qty 1

## 2023-11-22 MED ORDER — BUPROPION HCL ER (SR) 100 MG PO TB12
100.0000 mg | ORAL_TABLET | Freq: Two times a day (BID) | ORAL | Status: DC
  Administered 2023-11-23 – 2023-11-26 (×8): 100 mg via ORAL
  Filled 2023-11-22 (×10): qty 1

## 2023-11-22 MED ORDER — METHYLPREDNISOLONE SODIUM SUCC 125 MG IJ SOLR
125.0000 mg | Freq: Once | INTRAMUSCULAR | Status: AC
  Administered 2023-11-22: 125 mg via INTRAVENOUS
  Filled 2023-11-22: qty 2

## 2023-11-22 MED ORDER — METOPROLOL SUCCINATE ER 50 MG PO TB24
50.0000 mg | ORAL_TABLET | Freq: Every day | ORAL | Status: DC
  Administered 2023-11-23 – 2023-11-26 (×4): 50 mg via ORAL
  Filled 2023-11-22 (×4): qty 1

## 2023-11-22 MED ORDER — FUROSEMIDE 10 MG/ML IJ SOLN: 80.0000 mg | Freq: Two times a day (BID) | INTRAMUSCULAR | Status: DC

## 2023-11-22 MED ORDER — ATORVASTATIN CALCIUM 10 MG PO TABS
20.0000 mg | ORAL_TABLET | Freq: Every evening | ORAL | Status: DC
  Administered 2023-11-23 – 2023-11-26 (×4): 20 mg via ORAL
  Filled 2023-11-22 (×4): qty 2

## 2023-11-22 MED ORDER — LORATADINE 10 MG PO TABS
10.0000 mg | ORAL_TABLET | Freq: Every day | ORAL | Status: DC
Start: 2023-11-23 — End: 2023-11-27
  Administered 2023-11-23 – 2023-11-26 (×4): 10 mg via ORAL
  Filled 2023-11-22 (×4): qty 1

## 2023-11-22 MED ORDER — MONTELUKAST SODIUM 10 MG PO TABS
10.0000 mg | ORAL_TABLET | Freq: Every day | ORAL | Status: DC
  Administered 2023-11-23 – 2023-11-25 (×3): 10 mg via ORAL
  Filled 2023-11-22 (×3): qty 1

## 2023-11-22 MED ORDER — ATORVASTATIN CALCIUM 10 MG PO TABS: 20.0000 mg | ORAL_TABLET | Freq: Every evening | ORAL | Status: DC

## 2023-11-22 MED ORDER — BUSPIRONE HCL 5 MG PO TABS
7.5000 mg | ORAL_TABLET | Freq: Two times a day (BID) | ORAL | Status: DC
  Administered 2023-11-23 – 2023-11-26 (×7): 7.5 mg via ORAL
  Filled 2023-11-22 (×7): qty 2

## 2023-11-22 MED ORDER — INSULIN ASPART 100 UNIT/ML IJ SOLN
0.0000 [IU] | Freq: Three times a day (TID) | INTRAMUSCULAR | Status: DC
  Administered 2023-11-23: 7 [IU] via SUBCUTANEOUS
  Administered 2023-11-23: 2 [IU] via SUBCUTANEOUS
  Administered 2023-11-23 – 2023-11-24 (×3): 4 [IU] via SUBCUTANEOUS
  Administered 2023-11-24: 3 [IU] via SUBCUTANEOUS
  Administered 2023-11-25 (×2): 4 [IU] via SUBCUTANEOUS
  Administered 2023-11-25: 7 [IU] via SUBCUTANEOUS
  Administered 2023-11-26: 4 [IU] via SUBCUTANEOUS
  Administered 2023-11-26: 11 [IU] via SUBCUTANEOUS
  Administered 2023-11-26: 7 [IU] via SUBCUTANEOUS

## 2023-11-22 MED ORDER — ENOXAPARIN SODIUM 60 MG/0.6ML IJ SOSY: 60.0000 mg | PREFILLED_SYRINGE | INTRAMUSCULAR | Status: DC

## 2023-11-22 MED ORDER — LEVOTHYROXINE SODIUM 75 MCG PO TABS
150.0000 ug | ORAL_TABLET | Freq: Every day | ORAL | Status: DC
  Administered 2023-11-24 – 2023-11-26 (×3): 150 ug via ORAL
  Filled 2023-11-22 (×4): qty 2

## 2023-11-22 MED ORDER — FLUTICASONE FUROATE-VILANTEROL 100-25 MCG/ACT IN AEPB
1.0000 | INHALATION_SPRAY | Freq: Every day | RESPIRATORY_TRACT | Status: DC
  Filled 2023-11-22: qty 28

## 2023-11-22 MED ORDER — ALBUTEROL SULFATE (2.5 MG/3ML) 0.083% IN NEBU: 2.5000 mg | INHALATION_SOLUTION | Freq: Four times a day (QID) | RESPIRATORY_TRACT | Status: DC | PRN

## 2023-11-22 MED ORDER — MAGNESIUM SULFATE 2 GM/50ML IV SOLN
2.0000 g | Freq: Once | INTRAVENOUS | Status: AC
  Administered 2023-11-22: 2 g via INTRAVENOUS
  Filled 2023-11-22: qty 50

## 2023-11-22 MED ORDER — IPRATROPIUM-ALBUTEROL 0.5-2.5 (3) MG/3ML IN SOLN
3.0000 mL | Freq: Once | RESPIRATORY_TRACT | Status: AC
  Administered 2023-11-22: 3 mL via RESPIRATORY_TRACT
  Filled 2023-11-22: qty 3

## 2023-11-22 NOTE — Progress Notes (Signed)
   11/22/23 1919  BiPAP/CPAP/SIPAP  BiPAP/CPAP/SIPAP Pt Type Adult  BiPAP/CPAP/SIPAP SERVO  Mask Type Full face mask  Mask Size Large  Set Rate 22 breaths/min  Respiratory Rate 22 breaths/min  IPAP (S)  20 cmH20  EPAP (S)  8 cmH2O  PEEP 8 cmH20  FiO2 (%) 70 %  Minute Ventilation 7.5  Leak 40  Peak Inspiratory Pressure (PIP) 19  Tidal Volume (Vt) 526  Patient Home Equipment No  Auto Titrate No  Press High Alarm 30 cmH2O  Press Low Alarm 5 cmH2O  CPAP/SIPAP surface wiped down Yes  BiPAP/CPAP /SiPAP Vitals  BP 114/68  SpO2 92 %  Bilateral Breath Sounds Diminished  MEWS Score/Color  MEWS Score 2  MEWS Score Color Yellow

## 2023-11-22 NOTE — Assessment & Plan Note (Signed)
Appears chronic.  Usually wraps legs at her facility.  Wound care nurse consulted for continued wrapping of her legs.  However, we will also order bilateral ABIs given difficult to palpate pulses and sluggish capillary refill of the bilateral great toe in anticipation for wrapping. - Wound care nurse consulted, appreciate assistance - Follow-up ABIs

## 2023-11-22 NOTE — H&P (Addendum)
Hospital Admission History and Physical Service Pager: [phone]  Patient name: Chelsea [name] Medical record number: [id number] Date of Birth: December 05, 1965 Age: 58 y.o. Gender: female  Primary Care Provider: Pcp, No Consultants: None Code Status: FULL Code, confirmed with POA Marshall Cork, who was bedside given patient drowsy Preferred Emergency Contact:  Contact Information     Name Relation Home Work [facility] Significant other   [phone]      Other Contacts     Name Relation Home Work [place] Sister   [phone]   Michelene Gardener Brother   [phone]       Chief Complaint: Shortness of breath  Assessment and Plan: CHARMAGNE BUHL is a 58 y.o. female with PMHx of chronic hypercapnia/hypoxic respiratory failure requiring 5 L nasal cannula, OHS/OSA requiring nocturnal BiPAP, COPD, HFpEF, A-flutter on Eliquis, T2DM, HTN, HLD, hypothyroidism, restless leg syndrome, and IDA presenting with acute on chronic hypoxic/hypercapnic respiratory failure.   Differential for presentation of this includes:  CHF exacerbation: Most likely due to history and reported decrease of Lasix per fianc, chest x-ray findings of pulmonary edema, and elevated BNP from prior. OHS/OSA: More likely contributing to presentation given baseline impaired ventilation. Pneumonia: Less likely, patient with mild leukocytosis 10.6, afebrile, no sick symptoms reported by significant other, no focal consolidation on chest x-ray or exam (though limited by body habitus).  However, s/p antibiotics and tamiflu after healthcare associated pneumonia and H1N1 influenza on last admission which could increase her risk for MRSA pneumonia. Pulmonary embolism: Less likely given not tachycardic, she was on full dose anticoagulation outpatient, and suggestive findings of pulmonary edema on chest x-ray. ACS: Less likely, Trops 7, EKG NSR without ST elevations  Assessment &  Plan Acute on chronic hypoxic respiratory failure (HCC) Patient drowsy on exam, arousable to pain but unable to verbalize or answer questions, though she did have her BiPAP mask on. VBG showing pCO2 of 91 with a bicarb of 41. Patient O2 sats 40s at [name] and was brought in via EMS. Was started on BiPAP upon arrival to the ED, Sat 90s. Suspect etiology likely multifactorial with CHF exacerbation complicated by patient's minimal respiratory reserve due to OSA/OHS. Admitted earlier this month with similar presentation. Extensive conversation with FMTS, Pulm and ENT for possible trach placement during prior admission, patient deferred placement at that time. Per POA, patient does not desire trach at this time. Chest x-ray concerning for diffuse bilateral opacities, concerning for pulmonary edema. Patient is status post 40 mg IV Lasix in the ED.  - Admit to FMTS, attending Dr. McDiarmid, Progressive  - Continuous pulse ox   - Repeat VBG, if clinically worsens  - Strict I/Os - Daily weights - continue BiPAP, wean as tolerated to O2goal of 88-92%, patient will require BiPAP at at bedtime and w/ naps during the day  - O2 goal per facility is at least 8L [place]  - AM CBC and BMP  - Continue discussion surrounding tracheostomy placement as clinically indicated and as desired by patient, consider consulting pulmonology for further recommendations (HFpEF) heart failure with preserved ejection fraction (HCC) Pt w signs of fluid overload and increasing lower extremity edema.  BNP increased 348.6 on admission, BNP ~110s prior to discharge.  Patient received IV Lasix 40 mg x 1 while in the ED.  Will continue diuresis.  - s/p 40 mg IV Lasix x1 upon ED arrival  - Give additional 40 mg IV Lasix  tonight  - Re-dose lasix in AM at IV 80 mg twice daily - Holding Home regimen while on IV diuresis: Torsemide 60 mg BID and Jardiance 25 mg  - consider adding back Metolazone 2.5 mg BID, d/c last admission  due to AKI  - Daily BMP and Mag, with diuresis  - Strict I/Os  - Daily weights  Atrial flutter (HCC) Currently normal rate and regular rhythm on exam.  She took her Toprol XL and amiodarone this morning.  Holding home Eliquis in the setting of Lovenox here. - Held oral Toprol and amiodarone while on BiPAP, will need to administer IV if still unable to take p.o. in the a.m. T2DM (type 2 diabetes mellitus) (HCC) Last A1c around 9.  - A1c ordered - rSSI while n.p.o. on BiPAP - Restart home Lantus as able Venous stasis dermatitis of both lower extremities Appears chronic.  Usually wraps legs at her facility.  Wound care nurse consulted for continued wrapping of her legs.  However, we will also order bilateral ABIs given difficult to palpate pulses and sluggish capillary refill of the bilateral great toe in anticipation for wrapping. - Wound care nurse consulted, appreciate assistance - Follow-up ABIs  Chronic and Stable Problems: Restart Home medications, once mentation improves and after further discussion with facility COPD: Home inhalers Anemia: Home iron supplementation HLD: Home atorvastatin Hypothyroidism: Home levothyroxine RLS: Hold home ropinerole in setting of drowsiness on admission, can restart once mentation improves as needed GERD: Home pantoprazole Chronic pain: Holding home oxy given drowsiness, though does not appear she takes this daily (last prescription only 30 days worth, no fill hx in PDMP)  FEN/GI: Continue NPO, transition to diet once drowsiness resolves with correction of hypercarbia VTE Prophylaxis: Start Lovenox, consider transition to Home eliquis when tolerating PO   Disposition: Progressive  History of Present Illness:  Chelsea [name] is a 58 y.o. female presenting with AHRF. Majority of history obtained by fiance at bedside as patient is very drowsy.  Early this morning, patient was on her BiPAP at [facility]. Her fiance called her and could  not get her to pick up the phone. By lunchtime, he still had not gotten her on the phone, so he called the RN who went to check on her. She was satting in the 40s there with the BiPAP which prompted arrival to the ED.   Patient compliant with BiPAP at facility and required BiPAP for majority of the day time on Thursday due to increased lethargy. Fiance states that she will place the BiPAP always when sleepy and also when feeling more drowsy, so this was not new for her.  Fiance last seen her on Tuesday and she was normal. No pain. No increased SOB at that time. No other sick symptoms, though he is unsure if she has had any sick contacts. No fevers, chest pain, decreased PO, or GI symptoms that he knows of.  Fiance arrived at 4 PM and said she was not as responsive for him. She has continued to be less responsive for him while here. He mentions that they have discussed the trach more since her last admission, and she still does not want to go through with the procedure.   In the ED, patient was started on BiPAP, duo-nebs x1, IV solu-medrol 125 mg x1, Magnesium 2g and Lasix IV 40 mg X1   Review Of Systems: Per HPI  Pertinent Past Medical History: Chronic hypercapnia, hypoxic respiratory failure on 5L -8L  [place] OSA/OHS with  nocturnal hypoxemia on BiPAP COPD HFpEF (60-65% Feb 2025)  A-flutter on Eliquis T2DM HTN HLD Hypothyroidism  Restless leg syndrome IDA Remainder reviewed in history tab.   Pertinent Past Surgical History: Cholecystectomy Tonsillectomy Hysterectomy Remainder reviewed in history tab.   Pertinent Social History: Tobacco use: Never Alcohol use: None Other Substance use: None Lives with [facility]  Pertinent Family History: Father: Heart disease Remainder reviewed in history tab.   Important Outpatient Medications: Amiodarone 200 mg daily )  Atorvastatin 20 mg daily  Budesonide nebulizer 0.5 mg q12h Bupropion ER 100 mg BID Eliquis 5 mg BID   Empaglifozin 25 mg daily  Ferrous sulfate 325 mg daily Gabapentin 400 mg TID Insulin glargine 15 units daily  Levothyroxine 150 mcg daily  Montelukast 10 mg daily  Metoprolol XL 50 mg daily Omeprazole 20 mg daily  Percocet 10-325 Q6h  Potassium 40 mEq daily  Ropinirole 0.5 QHS  Spironolactone 25 mg daily  Torsemide 60 mg BID  Oxygen 5 L/min  BiPAP QHS and with naps  Awaiting confirmation from facility, attempted to call multiple times without answer  Remainder reviewed in medication history.   Objective: BP (!) 106/52 (BP Location: Right Arm)   Pulse 71   Temp 99.6 F (37.6 C) (Axillary)   Resp 20   Ht 5\' 4"  (1.626 m)   Wt 136.1 kg   SpO2 95%   BMI 51.49 kg/m  Exam: General: No acute distress, alert though drowsy, unable to verbalize or speak in sentences  Eyes: Non scleral icterus  HEENT: atraumatic, BiPAP in place  Cardiovascular: Regular rate and rhythm, 3/6 systolic murmur heard best at LUSB   Respiratory: diminished lung sounds with auscultation anteriorly, limited due to body habitus  Gastrointestinal: Normoactive bowel sounds, soft, diffuse tenderness to palpation Derm: chronic venous skin changes with erythema on bilateral lower extremities  Neuro: Drowsy, Arousable to pain and voice   Labs:  CBC BMET  Recent Labs  Lab 11/22/23 1608 11/22/23 1805  WBC 10.6*  --   HGB 11.7* 12.6  HCT 40.6 37.0  PLT 312  --    Recent Labs  Lab 11/22/23 1608 11/22/23 1805  NA 138 138  K 5.0 4.6  CL 91*  --   CO2 35*  --   BUN 33*  --   CREATININE 0.79  --   GLUCOSE 191*  --   CALCIUM 9.0  --      CMP     Component Value Date/Time   NA 138 11/22/2023 1805   K 4.6 11/22/2023 1805   CL 91 (L) 11/22/2023 1608   CO2 35 (H) 11/22/2023 1608   GLUCOSE 191 (H) 11/22/2023 1608   BUN 33 (H) 11/22/2023 1608   CREATININE 0.79 11/22/2023 1608   CALCIUM 9.0 11/22/2023 1608   PROT 7.6 11/22/2023 1608   ALBUMIN 3.1 (L) 11/22/2023 1608   AST 18 11/22/2023 1608   ALT  16 11/22/2023 1608   ALKPHOS 101 11/22/2023 1608   BILITOT 0.2 11/22/2023 1608   GFR 56.70 (L) 07/02/2018 1538   GFRNONAA >60 11/22/2023 1608   Pertinent additional labs: VBG: 7.265/91/60/41 Ionized calcium: 1.14 Lactic acid: 1.9 UA: Glucose > 500  EKG: NSR  Imaging Studies Performed: DG Chest Port 1 View Result Date: 11/22/2023 IMPRESSION: 1. Low lung volumes with bronchovascular crowding. Diffuse bilateral interstitial opacities, likely pulmonary edema. 2. Patchy bibasilar opacities, likely atelectasis. 3. Trace blunting of the right costophrenic angle, which may represent a small pleural effusion. 4. Similar markedly  enlarged cardiomediastinal silhouette.    Evette Georges, MD 11/22/2023, 10:45 PM PGY-1, [facility]  FPTS Intern pager: [phone], text pages welcome Secure chat group [facility] Teaching Service   I agree with the assessment and plan as documented above.  Janeal Holmes, MD PGY-2, [facility]

## 2023-11-22 NOTE — Assessment & Plan Note (Addendum)
Patient drowsy on exam, arousable to pain but unable to verbalize or answer questions, though she did have her BiPAP mask on. VBG showing pCO2 of 91 with a bicarb of 41. Patient O2 sats 40s at [facility] and was brought in via EMS. Was started on BiPAP upon arrival to the ED, Sat 90s. Suspect etiology likely multifactorial with CHF exacerbation complicated by patient's minimal respiratory reserve due to OSA/OHS. Admitted earlier this month with similar presentation. Extensive conversation with FMTS, Pulm and ENT for possible trach placement during prior admission, patient deferred placement at that time. Per POA, patient does not desire trach at this time. Chest x-ray concerning for diffuse bilateral opacities, concerning for pulmonary edema. Patient is status post 40 mg IV Lasix in the ED.  - Admit to FMTS, attending Dr. McDiarmid, Progressive  - Continuous pulse ox   - Repeat VBG, if clinically worsens  - Strict I/Os - Daily weights - continue BiPAP, wean as tolerated to O2goal of 88-92%, patient will require BiPAP at at bedtime and w/ naps during the day  - O2 goal per facility is at least 8L [place]  - AM CBC and BMP  - Continue discussion surrounding tracheostomy placement as clinically indicated and as desired by patient, consider consulting pulmonology for further recommendations

## 2023-11-22 NOTE — Progress Notes (Signed)
Pt transported on BIPAP from 010C to 4E25 with no complications.

## 2023-11-22 NOTE — ED Provider Notes (Signed)
[place] EMERGENCY DEPARTMENT AT [facility] Provider Note   CSN: [id number] Arrival date & time: 11/22/23  1531     History  No chief complaint on file.   Chelsea [name] is a 58 y.o. female with significant past medical history including obesity hypoventilation syndrome, A-flutter on Eliquis that she has been compliant with, CHF, COPD presented in respiratory distress.  Patient has been seen multiple times over the past few months for this and after speaking with the nursing home states that they feel this is another exacerbation.  Patient is AAO x 2 to person and however had decreased mental status per nursing home and they called 911.  They found that patient's SpO2 was in the 40s originally but nurse states that she thinks this may have been the nail polish.  They placed patient on 25 L nonrebreather and able to get her oxygen up to 95.  Nursing home states that patient was endorsing bilateral arm pain however there were no further details on this.  Patient states that she is coughing up some sputum but cannot say what color it is.  Patient states there is some chest discomfort as well.  Patient reportedly had end-tidal CO2 of 88 with EMS.  Last echo 11/11/2023: Likely moderate aortic stenosis, LVEF 6065%, grade 2 diastolic dysfunction  Home Medications Prior to Admission medications   Medication Sig Start Date End Date Taking? Authorizing Provider  amiodarone (PACERONE) 200 MG tablet Take 1 tablet (200 mg total) by mouth daily. 06/21/23  Yes Alfredo Martinez, MD  atorvastatin (LIPITOR) 20 MG tablet Take 20 mg by mouth every evening.   Yes [provider]  buPROPion ER (WELLBUTRIN SR) 100 MG 12 hr tablet Take 1 tablet (100 mg total) by mouth 2 (two) times daily. 06/08/23  Yes Alfredo Martinez, MD  busPIRone (BUSPAR) 7.5 MG tablet Take 1 tablet (7.5 mg total) by mouth 2 (two) times daily. 10/07/23  Yes Tiffany Kocher, DO  Cholecalciferol 125 MCG (5000 UT) TABS Take 1  tablet (5,000 Units total) by mouth daily. Patient taking differently: Take 5,000 Units by mouth in the morning. 07/10/22  Yes [name], Werner Lean, MD  diclofenac Sodium (VOLTAREN) 1 % GEL Apply 2-4 g topically in the morning and at bedtime. Apply to bilateral knees/L shoulder topically twice a day for arthritis 4g dose lower extremities, 2g dose upper extremities   Yes [provider]  ELIQUIS 5 MG TABS tablet Take 5 mg by mouth 2 (two) times daily. 04/12/21  Yes [provider]  empagliflozin (JARDIANCE) 25 MG TABS tablet Take 1 tablet (25 mg total) by mouth daily. Patient taking differently: Take 25 mg by mouth in the morning. 08/20/23  Yes Celine Mans, MD  ferrous sulfate 325 (65 FE) MG EC tablet Take 325 mg by mouth in the morning.   Yes [provider]  Fluticasone-Umeclidin-Vilant (TRELEGY ELLIPTA) 100-62.5-25 MCG/ACT AEPB Inhale 1 puff into the lungs daily. 10/04/23  Yes Levin Erp, MD  insulin glargine (LANTUS) 100 UNIT/ML Solostar Pen Inject 15 Units into the skin in the morning. 11/15/23  Yes Vonna Drafts, MD  levothyroxine (SYNTHROID) 150 MCG tablet Take 1 tablet (150 mcg total) by mouth daily at 6 (six) AM. Patient taking differently: Take 150 mcg by mouth in the morning. 07/10/22  Yes [name], Werner Lean, MD  loratadine (CLARITIN) 10 MG tablet Take 1 tablet (10 mg total) by mouth daily. 10/04/23  Yes Levin Erp, MD  melatonin 3 MG TABS tablet Take 3  mg by mouth at bedtime.   Yes [provider]  metoprolol succinate (TOPROL-XL) 50 MG 24 hr tablet Take 1 tablet (50 mg total) by mouth daily. Take with or immediately following a meal. Patient taking differently: Take 50 mg by mouth in the morning. Take with or immediately following a meal. 11/15/23  Yes Mahmood, Unknown Foley, MD  montelukast (SINGULAIR) 10 MG tablet Take 10 mg by mouth at bedtime.   Yes [provider]  Multiple Vitamin (MULTIVITAMIN WITH MINERALS) TABS tablet Take 1 tablet by  mouth in the morning.   Yes [provider]  omeprazole (PRILOSEC) 20 MG capsule Take 20 mg by mouth daily. 10/02/22  Yes [provider]  ondansetron (ZOFRAN) 4 MG/2ML SOLN injection Inject 4 mg into the vein every 4 (four) hours as needed for nausea or vomiting.   Yes [provider]  oxyCODONE-acetaminophen (PERCOCET) 10-325 MG tablet Take 1 tablet by mouth every 6 (six) hours. 06/21/23  Yes Alfredo Martinez, MD  OXYGEN Inhale 5 L/min into the lungs at bedtime. Connect to [employer] every night   Yes [provider]  Potassium Chloride ER 20 MEQ TBCR Take 2 tablets by mouth in the morning. 11/16/23  Yes [provider]  torsemide (DEMADEX) 20 MG tablet Take 60 mg by mouth 2 (two) times daily.   Yes [provider]  albuterol (PROVENTIL) (2.5 MG/3ML) 0.083% nebulizer solution Take 3 mLs (2.5 mg total) by nebulization every 6 (six) hours as needed for wheezing or shortness of breath (I50.32). Patient not taking: Reported on 11/22/2023 07/04/16   Oretha Milch, MD  loperamide (IMODIUM) 2 MG capsule Take 2 mg by mouth every 4 (four) hours as needed for diarrhea or loose stools. Patient not taking: Reported on 11/22/2023    [provider]  NON FORMULARY Apply BIPAP/CPAP every night at bedtime.    [provider]  rOPINIRole (REQUIP) 0.5 MG tablet Take 1 tablet (0.5 mg total) by mouth at bedtime. 06/21/23   Alfredo Martinez, MD  TRULICITY 1.5 MG/0.5ML SOAJ Inject 1.5 mg into the skin every Saturday. Patient not taking: Reported on 11/22/2023 10/31/23   [provider]      Allergies    Iodinated contrast media, Penicillins, Insulin lispro, and Minoxidil    Review of Systems   Review of Systems  Physical Exam Updated Vital Signs BP 120/70   Pulse 94   Temp 98.6 F (37 C) (Axillary)   Resp (!) 24   Ht 5\' 4"  (1.626 m)   Wt 136.1 kg   SpO2 90%   BMI 51.49 kg/m  Physical Exam Vitals reviewed.  Constitutional:       General: She is in acute distress.  HENT:     Head: Normocephalic and atraumatic.  Eyes:     Extraocular Movements: Extraocular movements intact.     Conjunctiva/sclera: Conjunctivae normal.     Pupils: Pupils are equal, round, and reactive to light.  Cardiovascular:     Rate and Rhythm: Normal rate and regular rhythm.     Pulses: Normal pulses.     Heart sounds: Normal heart sounds.     Comments: 2+ bilateral radial/dorsalis pedis pulses with regular rate Pulmonary:     Effort: Respiratory distress present.     Comments: Came in on 10 L nonrebreather Able answer yes or no questions but cannot speak in full sentences Decreased breath sounds bilaterally Abdominal:     Palpations: Abdomen is soft.     Tenderness: There is  no abdominal tenderness. There is no guarding or rebound.  Musculoskeletal:        General: Normal range of motion.     Cervical back: Normal range of motion and neck supple.     Right lower leg: Edema present.     Left lower leg: Edema present.     Comments: 5 out of 5 bilateral grip/leg extension strength  Skin:    General: Skin is warm and dry.     Capillary Refill: Capillary refill takes less than 2 seconds.  Neurological:     General: No focal deficit present.     Mental Status: She is alert and oriented to person, place, and time.     Comments: Sensation intact in all 4 limbs  Psychiatric:        Mood and Affect: Mood normal.     ED Results / Procedures / Treatments   Labs (all labs ordered are listed, but only abnormal results are displayed) Labs Reviewed  COMPREHENSIVE METABOLIC PANEL - Abnormal; Notable for the following components:      Result Value   Chloride 91 (*)    CO2 35 (*)    Glucose, Bld 191 (*)    BUN 33 (*)    Albumin 3.1 (*)    All other components within normal limits  CBC WITH DIFFERENTIAL/PLATELET - Abnormal; Notable for the following components:   WBC 10.6 (*)    Hemoglobin 11.7 (*)    MCHC 28.8 (*)    RDW 15.6 (*)     Neutro Abs 8.6 (*)    Abs Immature Granulocytes 0.09 (*)    All other components within normal limits  URINALYSIS, ROUTINE W REFLEX MICROSCOPIC - Abnormal; Notable for the following components:   Glucose, UA >=500 (*)    All other components within normal limits  I-STAT VENOUS BLOOD GAS, ED - Abnormal; Notable for the following components:   pCO2, Ven 91.1 (*)    pO2, Ven 60 (*)    Bicarbonate 41.4 (*)    TCO2 44 (*)    Acid-Base Excess 11.0 (*)    Calcium, Ion 1.14 (*)    All other components within normal limits  RESP PANEL BY RT-PCR (RSV, FLU A&B, COVID)  RVPGX2  BRAIN NATRIURETIC PEPTIDE  I-STAT CG4 LACTIC ACID, ED  I-STAT CG4 LACTIC ACID, ED  TROPONIN I (HIGH SENSITIVITY)  TROPONIN I (HIGH SENSITIVITY)    EKG EKG Interpretation Date/Time:  Friday November 22 2023 15:47:40 EST Ventricular Rate:  91 PR Interval:  169 QRS Duration:  108 QT Interval:  368 QTC Calculation: 453 R Axis:   82  Text Interpretation: Sinus rhythm Minimal ST depression, diffuse leads when compared to prior, now appears sinus from prior. No STEMI Confirmed by Theda Belfast ([id number]) on 11/22/2023 3:51:42 PM  Radiology DG Chest Port 1 View Result Date: 11/22/2023 CLINICAL DATA:  Shortness of breath EXAM: PORTABLE CHEST 1 VIEW COMPARISON:  Chest radiograph dated 11/06/2023 FINDINGS: Low lung volumes with bronchovascular crowding. Diffuse bilateral interstitial opacities. Patchy bibasilar opacities. Left costophrenic angle is not fully included within the field of view. Trace blunting of the right costophrenic angle. No pneumothorax. Similar markedly enlarged cardiomediastinal silhouette. No acute osseous abnormality. IMPRESSION: 1. Low lung volumes with bronchovascular crowding. Diffuse bilateral interstitial opacities, likely pulmonary edema. 2. Patchy bibasilar opacities, likely atelectasis. 3. Trace blunting of the right costophrenic angle, which may represent a small pleural effusion. 4. Similar markedly  enlarged cardiomediastinal silhouette. Electronically Signed   By: Benjaman Kindler  Xu M.D.   On: 11/22/2023 16:31    Procedures .Critical Care  Performed by: Netta Corrigan, PA-C Authorized by: Netta Corrigan, PA-C   Critical care provider statement:    Critical care time (minutes):  60   Critical care time was exclusive of:  Separately billable procedures and treating other patients   Critical care was necessary to treat or prevent imminent or life-threatening deterioration of the following conditions:  Respiratory failure   Critical care was time spent personally by me on the following activities:  Blood draw for specimens, development of treatment plan with patient or surrogate, discussions with consultants, evaluation of patient's response to treatment, examination of patient, obtaining history from patient or surrogate, ventilator management, review of old charts, re-evaluation of patient's condition, pulse oximetry, ordering and review of radiographic studies, ordering and review of laboratory studies and ordering and performing treatments and interventions   I assumed direction of critical care for this patient from another provider in my specialty: no     Care discussed with: admitting provider       Medications Ordered in ED Medications  ipratropium-albuterol (DUONEB) 0.5-2.5 (3) MG/3ML nebulizer solution 3 mL (3 mLs Nebulization Given 11/22/23 1624)  methylPREDNISolone sodium succinate (SOLU-MEDROL) 125 mg/2 mL injection 125 mg (125 mg Intravenous Given 11/22/23 1704)  magnesium sulfate IVPB 2 g 50 mL (0 g Intravenous Stopped 11/22/23 1806)  furosemide (LASIX) injection 40 mg (40 mg Intravenous Given 11/22/23 1704)    ED Course/ Medical Decision Making/ A&P                                 Medical Decision Making Amount and/or Complexity of Data Reviewed Labs: ordered. Radiology: ordered.  Risk Prescription drug management.   Thomes Lolling 58 y.o. presented today for  shortness of breath.  Working DDx that I considered at this time includes, but not limited to, asthma/COPD exacerbation, URI, viral illness, anemia, ACS, PE, pneumonia, pleural effusion, lung cancer, CHF, respiratory distress, medication side effect, intoxication.  R/o DDx: Asthma exacerbation, URI, viral illness, anemia, ACS, PE, pneumonia, lung cancer, medication side effect, intoxication: These are considered less likely due to history of present illness, physical exam, labs/imaging findings  Review of prior external notes: 11/03/2023 discharge summary  Unique Tests and My Independent Interpretation:  CBC: Mild leukocytosis 10.6 CMP: Unremarkable BNP: Pending VBG: Hypercapnia 91.1 Lactic acid: 1.9 Troponin: 8 EKG: Sinus 91 bpm, no ST elevations or depressions CXR: Pleural effusions noted bilaterally Respiratory Panel: Negative  Social Determinants of Health: none  Discussion with Independent Historian:  Sherry, Rehab Nurse; significant other  Discussion of Management of Tests:  Baloch, MD Family Medicine  Risk: High: hospitalization or escalation of hospital-level care  Risk Stratification Score: none  Staffed with Tegeler, MD; Particia Nearing, MD  Plan: On exam patient was in respiratory distress. Physical exam showed patient able to answer yes or no questions but cannot speak in full sentences with decreased breath sounds bilaterally.  Patient does endorse chest discomfort when asked yesterday no questions but cannot further elaborate.  Patient at the nursing home stated that she had bilateral arm pain however does not endorse this with me.  I spoke to patient's nurse and patient has not had any fever since being discharged but they feel this is her chronic respiratory distress that is become more more prevalent and there is talk of a trach in the future. The cardiac monitor  was ordered secondary to the patient's history of shortness of breath and to monitor the patient for dysrhythmia.  Cardiac monitor by my independent interpretation showed normal sinus.  Patient arrived on 10 L nonrebreather satting at 92%.  Will give breathing treatment along with steroids and magnesium as this appeared to help last time.  Will hold off on fluids given suspected moderate aortic stenosis to prevent flash pulmonary edema.  Patient's chest x-ray shows likely pulmonary edema so we will give her Lasix that is most likely contributing to her respiratory failure.  On reevaluation patient's SpO2 was 92% in the room while on BiPAP.  Patient's mental status is the same as previous.  1 to obtain more labs we will call family medicine for admission.  Respiratory was notified to see if we increased PEEP and respiratory rate as patient still has same mental baseline and is not showing signs of improvement.  I spoke to [facility] and they will admit.  This chart was dictated using voice recognition software.  Despite best efforts to proofread,  errors can occur which can change the documentation meaning.         Final Clinical Impression(s) / ED Diagnoses Final diagnoses:  Acute on chronic respiratory failure with hypoxia and hypercapnia (HCC)  COPD exacerbation [facility])    Rx / DC Orders ED Discharge Orders     None         Remi Deter 11/22/23 Ellard Artis, MD 11/22/23 [phone]

## 2023-11-22 NOTE — Assessment & Plan Note (Signed)
Last A1c around 9.  - A1c ordered - rSSI while n.p.o. on BiPAP - Restart home Lantus as able

## 2023-11-22 NOTE — Assessment & Plan Note (Signed)
Currently normal rate and regular rhythm on exam.  She took her Toprol XL and amiodarone this morning.  Holding home Eliquis in the setting of Lovenox here. - Held oral Toprol and amiodarone while on BiPAP, will need to administer IV if still unable to take p.o. in the a.m.

## 2023-11-22 NOTE — Hospital Course (Addendum)
 Chelsea [name] is a 58 y.o. female who was admitted to the [facility] Medicine Teaching Service at [facility] for acute on chronic respiratory failure. Hospital course is outlined below by problem.    Acute Respiratory Failure  Patient presented to the hospital from her ALF, due to increased drowsiness and was reported to be hypoxic to the 40s. She was placed on BiPAP  in the emergency department. VBGs showed pCO2 of 91, white count elevated to 10.4 and a normal lactic acid level.  Chest x-ray showed bilateral opacities suspicious for pulmonary edema. Patient was treated with Solu-Medrol, mag sulfate and given 40 mg IV Lasix given her history of HFpEF. On admission she was started on BiPAP for goal oxygen saturation of 88 to 92%.  Given her somnolence any CNS acting medications were withheld, but were gradually restarted as mentation improved.  BiPAP was weaned appropriately, first to allow for 30 minutes to an hour for meals and medication passes, then to low flow nasal cannula O2 support while awake with BiPAP when sleeping.***  HFpEF Pts presented with increased lower extremity edema bilaterally. BNP was elevated at 341 and CXR suspicious for pulmonary Edema. Patient received 40 mg IV lasix x2 in the ED due to concern of volume overload. Her lasix was redosed and advanced to 120 mg of IV Lasix to twice daily, plus 2.5 mg of metolazone. She tolerated this regimen well, and diuresed appropriately. Daily weights, strict I/Os, BMP and Mag were monitored. Overall patient was diuresed ~ ***L over the course of her hospitalization. Discharge regimen is torsemide 80 mg BID, and metolazone 2.5 mg 3 times weekly.   Other chronic conditions were medically managed with home medications and formulary alternatives as necessary  PCP Follow-up Recommendations: Recheck BMP outpatient, monitoring Cr and K  Ensure follow-up with Pulmonology  Review medications and administration carefully for changes

## 2023-11-22 NOTE — ED Triage Notes (Signed)
From [facility]. Spo2 was in the 40's placed on NRB spo2 at 65 with 15 L 95 with 25L. C/o pain it arms ETCO2 88 26 104/73, 90,228cbg no IV placed

## 2023-11-22 NOTE — Plan of Care (Signed)
FMTS Interim Progress Note  Paged for admission from the ED for acute on chronic hypoxic respiratory failure.  Patient briefly assessed at bedside with husband present.  She was responsive to commands could open eyes and give thumbs up to voice.  Otherwise nonverbal.  BiPAP in place without increased work of breathing.  Well-known to the service, similar presentation to prior admissions.  FMTS team to admit patient eminently.  [name], MD 11/22/2023, 7:02 PM PGY-2, [facility] Family Medicine Service pager [phone]

## 2023-11-22 NOTE — Assessment & Plan Note (Addendum)
Pt w signs of fluid overload and increasing lower extremity edema.  BNP increased 348.6 on admission, BNP ~110s prior to discharge.  Patient received IV Lasix 40 mg x 1 while in the ED.  Will continue diuresis.  - s/p 40 mg IV Lasix x1 upon ED arrival  - Give additional 40 mg IV Lasix tonight  - Re-dose lasix in AM at IV 80 mg twice daily - Holding Home regimen while on IV diuresis: Torsemide 60 mg BID and Jardiance 25 mg  - consider adding back Metolazone 2.5 mg BID, d/c last admission due to AKI  - Daily BMP and Mag, with diuresis  - Strict I/Os  - Daily weights

## 2023-11-23 ENCOUNTER — Inpatient Hospital Stay (HOSPITAL_COMMUNITY): Payer: Medicare Other

## 2023-11-23 ENCOUNTER — Encounter (HOSPITAL_COMMUNITY): Payer: Self-pay | Admitting: Family Medicine

## 2023-11-23 DIAGNOSIS — Z6841 Body Mass Index (BMI) 40.0 and over, adult: Secondary | ICD-10-CM

## 2023-11-23 DIAGNOSIS — E66813 Obesity, class 3: Secondary | ICD-10-CM

## 2023-11-23 DIAGNOSIS — I872 Venous insufficiency (chronic) (peripheral): Secondary | ICD-10-CM

## 2023-11-23 DIAGNOSIS — I739 Peripheral vascular disease, unspecified: Secondary | ICD-10-CM | POA: Diagnosis not present

## 2023-11-23 DIAGNOSIS — G4733 Obstructive sleep apnea (adult) (pediatric): Secondary | ICD-10-CM | POA: Diagnosis not present

## 2023-11-23 DIAGNOSIS — E669 Obesity, unspecified: Secondary | ICD-10-CM | POA: Diagnosis present

## 2023-11-23 DIAGNOSIS — I5033 Acute on chronic diastolic (congestive) heart failure: Secondary | ICD-10-CM | POA: Diagnosis not present

## 2023-11-23 DIAGNOSIS — J9621 Acute and chronic respiratory failure with hypoxia: Secondary | ICD-10-CM | POA: Diagnosis not present

## 2023-11-23 DIAGNOSIS — J984 Other disorders of lung: Secondary | ICD-10-CM

## 2023-11-23 HISTORY — DX: Obesity, unspecified: E66.9

## 2023-11-23 LAB — CBC
HCT: 38 % (ref 36.0–46.0)
Hemoglobin: 11.2 g/dL — ABNORMAL LOW (ref 12.0–15.0)
MCH: 26.4 pg (ref 26.0–34.0)
MCHC: 29.5 g/dL — ABNORMAL LOW (ref 30.0–36.0)
MCV: 89.6 fL (ref 80.0–100.0)
Platelets: 280 10*3/uL (ref 150–400)
RBC: 4.24 MIL/uL (ref 3.87–5.11)
RDW: 15.7 % — ABNORMAL HIGH (ref 11.5–15.5)
WBC: 6.5 10*3/uL (ref 4.0–10.5)
nRBC: 0 % (ref 0.0–0.2)

## 2023-11-23 LAB — BLOOD GAS, VENOUS
Acid-Base Excess: 16.4 mmol/L — ABNORMAL HIGH (ref 0.0–2.0)
Acid-Base Excess: 19.3 mmol/L — ABNORMAL HIGH (ref 0.0–2.0)
Bicarbonate: 46.9 mmol/L — ABNORMAL HIGH (ref 20.0–28.0)
Bicarbonate: 49.1 mmol/L — ABNORMAL HIGH (ref 20.0–28.0)
Drawn by: 8253
Drawn by: 8289
O2 Saturation: 81.3 %
O2 Saturation: 42.4 %
Patient temperature: 36.7
Patient temperature: 37
pCO2, Ven: 88 mm[Hg] (ref 44–60)
pCO2, Ven: 89 mm[Hg] (ref 44–60)
pH, Ven: 7.33 (ref 7.25–7.43)
pH, Ven: 7.35 (ref 7.25–7.43)
pO2, Ven: 46 mm[Hg] — ABNORMAL HIGH (ref 32–45)
pO2, Ven: <31 mm[Hg] — CL (ref 32–45)

## 2023-11-23 LAB — VAS US ABI WITH/WO TBI
Left ABI: 0.94
Right ABI: 2.3

## 2023-11-23 LAB — BASIC METABOLIC PANEL
Anion gap: 13 (ref 5–15)
Anion gap: 12 (ref 5–15)
BUN: 35 mg/dL — ABNORMAL HIGH (ref 6–20)
BUN: 42 mg/dL — ABNORMAL HIGH (ref 6–20)
CO2: 37 mmol/L — ABNORMAL HIGH (ref 22–32)
CO2: 38 mmol/L — ABNORMAL HIGH (ref 22–32)
Calcium: 9.4 mg/dL (ref 8.9–10.3)
Calcium: 9.5 mg/dL (ref 8.9–10.3)
Chloride: 92 mmol/L — ABNORMAL LOW (ref 98–111)
Chloride: 93 mmol/L — ABNORMAL LOW (ref 98–111)
Creatinine, Ser: 0.85 mg/dL (ref 0.44–1.00)
Creatinine, Ser: 0.81 mg/dL (ref 0.44–1.00)
GFR, Estimated: >60 mL/min (ref >60)
GFR, Estimated: >60 mL/min (ref >60)
Glucose, Bld: 292 mg/dL — ABNORMAL HIGH (ref 70–99)
Glucose, Bld: 195 mg/dL — ABNORMAL HIGH (ref 70–99)
Potassium: 4.9 mmol/L (ref 3.5–5.1)
Potassium: 4.1 mmol/L (ref 3.5–5.1)
Sodium: 142 mmol/L (ref 135–145)
Sodium: 143 mmol/L (ref 135–145)

## 2023-11-23 LAB — GLUCOSE, CAPILLARY
Glucose-Capillary: 221 mg/dL — ABNORMAL HIGH (ref 70–99)
Glucose-Capillary: 194 mg/dL — ABNORMAL HIGH (ref 70–99)
Glucose-Capillary: 173 mg/dL — ABNORMAL HIGH (ref 70–99)
Glucose-Capillary: 144 mg/dL — ABNORMAL HIGH (ref 70–99)

## 2023-11-23 LAB — MAGNESIUM
Magnesium: 2.6 mg/dL — ABNORMAL HIGH (ref 1.7–2.4)
Magnesium: 2.6 mg/dL — ABNORMAL HIGH (ref 1.7–2.4)

## 2023-11-23 LAB — HEMOGLOBIN A1C
Hgb A1c MFr Bld: 8.5 % — ABNORMAL HIGH (ref 4.8–5.6)
Mean Plasma Glucose: 197.25 mg/dL

## 2023-11-23 MED ORDER — INSULIN GLARGINE 100 UNIT/ML ~~LOC~~ SOLN
10.0000 [IU] | Freq: Every day | SUBCUTANEOUS | Status: DC
  Administered 2023-11-23 – 2023-11-26 (×4): 10 [IU] via SUBCUTANEOUS
  Filled 2023-11-23 (×4): qty 0.1

## 2023-11-23 MED ORDER — ORAL CARE MOUTH RINSE: 15.0000 mL | OROMUCOSAL | Status: DC | PRN

## 2023-11-23 MED ORDER — FUROSEMIDE 10 MG/ML IJ SOLN
120.0000 mg | Freq: Two times a day (BID) | INTRAVENOUS | Status: DC
  Administered 2023-11-23 – 2023-11-25 (×4): 120 mg via INTRAVENOUS
  Filled 2023-11-23: qty 10
  Filled 2023-11-23: qty 12
  Filled 2023-11-23: qty 10
  Filled 2023-11-23: qty 12
  Filled 2023-11-23: qty 10

## 2023-11-23 MED ORDER — EMPAGLIFLOZIN 25 MG PO TABS
25.0000 mg | ORAL_TABLET | Freq: Every morning | ORAL | Status: DC
  Administered 2023-11-24 – 2023-11-26 (×3): 25 mg via ORAL
  Filled 2023-11-23 (×4): qty 1

## 2023-11-23 MED ORDER — ENOXAPARIN SODIUM 150 MG/ML IJ SOSY
130.0000 mg | PREFILLED_SYRINGE | Freq: Two times a day (BID) | INTRAMUSCULAR | Status: DC
  Administered 2023-11-23 (×2): 130 mg via SUBCUTANEOUS
  Filled 2023-11-23 (×3): qty 0.86

## 2023-11-23 MED ORDER — INSULIN ASPART 100 UNIT/ML IJ SOLN
10.0000 [IU] | Freq: Once | INTRAMUSCULAR | Status: AC
  Administered 2023-11-23: 10 [IU] via SUBCUTANEOUS

## 2023-11-23 MED ORDER — ROPINIROLE HCL 0.5 MG PO TABS
0.5000 mg | ORAL_TABLET | Freq: Every day | ORAL | Status: DC
  Administered 2023-11-23 – 2023-11-25 (×3): 0.5 mg via ORAL
  Filled 2023-11-23 (×4): qty 1

## 2023-11-23 MED ORDER — METOLAZONE 5 MG PO TABS
2.5000 mg | ORAL_TABLET | ORAL | Status: DC
  Administered 2023-11-25: 2.5 mg via ORAL
  Filled 2023-11-23: qty 1

## 2023-11-23 MED ORDER — FUROSEMIDE 10 MG/ML IJ SOLN
100.0000 mg | Freq: Two times a day (BID) | INTRAVENOUS | Status: DC
  Filled 2023-11-23: qty 10

## 2023-11-23 MED ORDER — INSULIN ASPART 100 UNIT/ML IJ SOLN
5.0000 [IU] | Freq: Once | INTRAMUSCULAR | Status: AC
  Administered 2023-11-23: 5 [IU] via SUBCUTANEOUS

## 2023-11-23 MED ORDER — FUROSEMIDE 10 MG/ML IJ SOLN
80.0000 mg | Freq: Two times a day (BID) | INTRAMUSCULAR | Status: DC
  Administered 2023-11-23: 80 mg via INTRAVENOUS
  Filled 2023-11-23: qty 8

## 2023-11-23 NOTE — Progress Notes (Signed)
 PHARMACY - ANTICOAGULATION CONSULT NOTE  Pharmacy Consult for lovenox Indication: atrial fibrillation  Allergies  Allergen Reactions   Iodinated Contrast Media Anaphylaxis   Penicillins Anaphylaxis    Patient tolerates cefepime (08/2017)   Insulin Lispro Other (See Comments)    Adder per [facility] from SNF   Minoxidil Hives    Patient Measurements: Height: 5\' 4"  (162.6 cm) Weight: (!) 148.1 kg (326 lb 8 oz) IBW/kg (Calculated) : 54.7  Vital Signs: Temp: 97.7 F (36.5 C) (02/22 0500) Temp Source: Axillary (02/22 0500) BP: 104/54 (02/22 0500) Pulse Rate: 74 (02/22 0500)  Labs: Recent Labs    11/22/23 1607 11/22/23 1608 11/22/23 1608 11/22/23 1805 11/23/23 0305  HGB  --  11.7*   < > 12.6 11.2*  HCT  --  40.6  --  37.0 38.0  PLT  --  312  --   --  280  CREATININE  --  0.79  --   --  0.85  TROPONINIHS 7 8  --   --   --    < > = values in this interval not displayed.    Estimated Creatinine Clearance: 103.6 mL/min (by C-G formula based on SCr of 0.85 mg/dL).   Medical History: Past Medical History:  Diagnosis Date   Acquired hypothyroidism 11/13/2010   Qualifier: Diagnosis of   By: Jens Som, MD, Lyn Hollingshead      Acute blood loss anemia 07/18/2022   Acute congestive heart failure (HCC) 03/18/2021   Acute hypoxemic respiratory failure (HCC) 11/03/2023   Acute hypoxic on chronic hypercapnic respiratory failure (HCC) 09/19/2023   Acute idiopathic gout of left hand 02/16/2019   Acute kidney injury (HCC) 05/29/2023   Acute metabolic encephalopathy 09/29/2023   Acute on chronic diastolic CHF (congestive heart failure), NYHA class 3 (HCC) 09/25/2017   Acute on chronic respiratory failure with hypoxia and hypercapnia (HCC) 04/07/2022   Arthritis    "qwhere" (12/05/2017)   Asthma    Atrial flutter (HCC) 03/09/2021   Bleeding of the respiratory tract 04/07/2022   Bronchitis 05/30/2011   Cervical cancer (HCC) 2006   CHF exacerbation (HCC) 07/30/2022   Chronic heart  failure with preserved ejection fraction (HCC) 02/23/2018   Chronic lower back pain    Chronic respiratory failure with hypoxia (HCC) 07/02/2018   ABG 11/2017 7.36/69   Consistent with obesity hypoventilation syndrome   Complication of anesthesia    "I have a hard time waking up from under it" (12/05/2017)   COPD (chronic obstructive pulmonary disease) (HCC) 03/04/2015   Coronary artery disease    Cough productive of clear sputum 06/03/2023   Diarrhea 06/18/2019   DJD (degenerative joint disease) 07/03/2021   DM2 (diabetes mellitus, type 2) (HCC) 11/10/2010   Dyspnea 12/05/2017   Dyspnea on exertion 02/09/2021   Edema 03/22/2011   Essential (primary) hypertension 11/10/2010   Formatting of this note might be different from the original.  Overview:   Qualifier: Diagnosis of   By: Kem Parkinson      Last Assessment & Plan:   Watch blood pressure off of minoxidil and add medications as needed.   Essential hypertension 11/10/2010   Qualifier: Diagnosis of   By: Kem Parkinson       Gastro-esophageal reflux disease without esophagitis 11/10/2010   GERD 11/10/2010   Qualifier: Diagnosis of   By: Kem Parkinson       Hair loss 09/17/2019   HCAP (healthcare-associated pneumonia) 11/07/2023   Heart failure (HCC) 04/20/2021   Heart murmur  Hepatitis C    History of atrial fibrillation 09/04/2021   History of cervical cancer 03/22/2011   History of gout X 1   History of tobacco use 04/20/2021   History of urinary retention 09/20/2023   Hx of atrial flutter 03/15/2021   Hyperglycemia due to type 2 diabetes mellitus (HCC) 04/20/2021   Hyperlipidemia    Hyperphosphatemia 06/04/2023   Hypertension associated with diabetes (HCC) 08/25/2018   Intertrigo 06/22/2023   Irritable bowel syndrome with diarrhea 06/18/2019   Limitation of activity due to disability 09/18/2021   Lymphedema of both lower extremities 03/22/2011   Metabolic alkalosis 03/28/2021   Microalbuminuria 04/20/2021    Migraine    "monthly" (12/05/2017)   Mild tricuspid regurgitation 04/02/2021   Mitral valve sclerosis 04/02/2021   Mixed hyperlipidemia 04/18/2020   Moderate aortic stenosis 04/20/2021   Mononeuropathy of lower extremity 04/09/2011   Formatting of this note might be different from the original.  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  Severe, corrected by CPAP 18 with C-flex of 3  2/13 bipap 20/14 sm ff mask h/h biflex+3cm  Formatting of this note might be different from the original.  Sleep study 06/2020: IMPRESSIONS  - Moderate obstructive sleep apnea occurred during this study (AHI = 17.7/h), mostly REM related  - No significant central sleep apnea occurred during   On home oxygen therapy    "2L all the time" (12/05/2017)   On supplemental oxygen by nasal cannula 04/20/2021   Onychomycosis 06/18/2019   OSA treated with BiPAP    "have CPAP at home too; wearing BiPAP right now" (12/05/2017)   Osteoarthritis 11/28/2010   Qualifier: Diagnosis of   By: Huntley Dec, Scott       Other long term (current) drug therapy 02/12/2022   Pain of left middle finger 06/22/2023   Paroxysmal atrial fibrillation (HCC) 09/04/2021   Physical debility 05/10/2018   Formatting of this note might be different from the original.  Due to morbid obesity    Physical deconditioning 04/24/2021   Pneumonia    "several times" (12/05/2017)   Pneumonia due to gram-positive bacteria 08/24/2017   Pressure injury of both heels, unstageable (HCC) 07/03/2021   Pulmonary edema, acute (HCC) 02/21/2022   Renal insufficiency 04/20/2021   Restless leg syndrome 03/22/2021   Restrictive lung disease secondary to obesity 01/28/2022   S/P hysterectomy 02/19/2021   Tinea pedis of both feet 11/12/2018   Unspecified atrial flutter (HCC) 02/12/2022   Upper GI bleed 07/18/2022   Venous insufficiency of both lower extremities 07/02/2021   Weakness 03/20/2021   Weight loss 03/15/2021   Yeast infection 08/18/2023    Medications:  Medications Prior to Admission  Medication Sig Dispense Refill Last Dose/Taking   amiodarone (PACERONE) 200 MG tablet Take 1 tablet (200 mg total) by mouth daily.  11/22/2023   atorvastatin (LIPITOR) 20 MG tablet Take 20 mg by mouth every evening.   11/21/2023   buPROPion ER (WELLBUTRIN SR) 100 MG 12 hr tablet Take 1 tablet (100 mg total) by mouth 2 (two) times daily. 30 tablet 0 11/22/2023 Morning   busPIRone (BUSPAR) 7.5 MG tablet Take 1 tablet (7.5 mg total) by mouth 2 (two) times daily.   11/22/2023 Morning   Cholecalciferol 125 MCG (5000 UT) TABS Take 1 tablet (5,000 Units total) by mouth daily. (Patient taking differently: Take 5,000 Units by mouth in the morning.) 30 tablet  11/22/2023   diclofenac Sodium (VOLTAREN) 1 % GEL Apply 2-4 g topically in the morning and at bedtime. Apply to bilateral knees/L shoulder topically twice a day for arthritis 4g dose lower extremities, 2g dose upper extremities   11/22/2023   ELIQUIS 5 MG TABS tablet Take 5 mg by mouth 2 (two) times daily.   11/22/2023 at  9:00 AM   empagliflozin (JARDIANCE) 25 MG TABS tablet Take 1 tablet (25 mg total) by mouth daily. (Patient taking differently: Take 25 mg by mouth in the morning.)   11/22/2023   ferrous sulfate 325 (65 FE) MG EC tablet Take 325 mg by mouth in the  morning.   11/22/2023   Fluticasone-Umeclidin-Vilant (TRELEGY ELLIPTA) 100-62.5-25 MCG/ACT AEPB Inhale 1 puff into the lungs daily.   11/22/2023   insulin glargine (LANTUS) 100 UNIT/ML Solostar Pen Inject 15 Units into the skin in the morning.   11/22/2023   levothyroxine (SYNTHROID) 150 MCG tablet Take 1 tablet (150 mcg total) by mouth daily at 6 (six) AM. (Patient taking differently: Take 150 mcg by mouth in the morning.)   11/22/2023   loratadine (CLARITIN) 10 MG tablet Take 1 tablet (10 mg total) by mouth daily.   11/22/2023   melatonin 3 MG TABS tablet Take 3 mg by mouth at bedtime.   11/21/2023   metoprolol succinate (TOPROL-XL) 50 MG 24 hr tablet Take 1 tablet (50 mg total) by mouth daily. Take with or immediately following a meal. (Patient taking differently: Take 50 mg by mouth in the morning. Take with or immediately following a meal.)   11/22/2023   montelukast (SINGULAIR) 10 MG tablet Take 10 mg by mouth at bedtime.   11/21/2023   Multiple Vitamin (MULTIVITAMIN WITH MINERALS) TABS tablet Take 1 tablet by mouth in the morning.   11/22/2023   omeprazole (PRILOSEC) 20 MG capsule Take 20 mg by mouth daily.   11/22/2023   ondansetron (ZOFRAN) 4 MG/2ML SOLN injection Inject 4 mg into the vein every 4 (four) hours as needed for nausea or vomiting.   Taking As Needed   oxyCODONE-acetaminophen (PERCOCET) 10-325 MG tablet Take 1 tablet by mouth every 6 (six) hours. 30 tablet 0 11/19/2023   OXYGEN Inhale 5 L/min into the lungs at bedtime. Connect to BIPAP/CPAP every night   11/21/2023   Potassium Chloride ER 20 MEQ TBCR Take 2 tablets by mouth in the morning.   11/22/2023   torsemide (DEMADEX) 20 MG tablet Take 60 mg by mouth 2 (two) times daily.   11/22/2023   albuterol (PROVENTIL) (2.5 MG/3ML) 0.083% nebulizer solution Take 3 mLs (2.5 mg total) by nebulization every 6 (six) hours as needed for wheezing or shortness of breath (I50.32). (Patient not taking: Reported on 11/22/2023) 75 mL 12 Not Taking   loperamide  (IMODIUM) 2 MG capsule Take 2 mg by mouth every 4 (four) hours as needed for diarrhea or loose stools. (Patient not taking:  Reported on 11/22/2023)   Not Taking   NON FORMULARY Apply BIPAP/CPAP every night at bedtime.      rOPINIRole (REQUIP) 0.5 MG tablet Take 1 tablet (0.5 mg total) by mouth at bedtime.      TRULICITY 1.5 MG/0.5ML SOAJ Inject 1.5 mg into the skin every Saturday. (Patient not taking: Reported on 11/22/2023)   Not Taking   Scheduled:   amiodarone  200 mg Oral Daily   atorvastatin  20 mg Oral QPM   buPROPion ER  100 mg Oral BID   busPIRone  7.5 mg Oral BID   enoxaparin (LOVENOX) injection  130 mg Subcutaneous Q12H   ferrous sulfate  325 mg Oral q AM   fluticasone furoate-vilanterol  1 puff Inhalation Daily   And   umeclidinium bromide  1 puff Inhalation Daily   furosemide  80 mg Intravenous BID   insulin aspart  0-20 Units Subcutaneous TID WC   levothyroxine  150 mcg Oral Q0600   loratadine  10 mg Oral Daily   metoprolol succinate  50 mg Oral Daily   montelukast  10 mg Oral QHS   pantoprazole  40 mg Oral Daily    Assessment: 24 YOF admitted for heart failure exacerbation. PMH significant for atrial flutter on Eliquis PTA. Eliquis held on admission since patient is NPO. Per MD, would like to start lovenox while patient remains NPO; avoiding heparin infusion to limit amount of fluids given to patient. Pharmacy consulted to assist with dosing of lovenox.   Patient 136 kg on admission, will utilize this weight for dosing.   Goal of Therapy:  Anti-Xa level 0.6-1 units/ml 4hrs after LMWH dose given Monitor platelets by anticoagulation protocol: Yes   Plan:  Lovenox 130 mg subcutaneous every 12 hours Monitor changes in body weight daily Check anti-Xa levels as appropriate F/u transition back to PO Eliquis  Enos Fling, PharmD PGY-1 Acute Care Pharmacy Resident 11/23/2023 8:40 AM

## 2023-11-23 NOTE — Assessment & Plan Note (Addendum)
 A1c 8.5, taking Lantus 15 at home.  Will start Lantus 10 units and monitor CBGs. - Add Lantus 10U daily + rSSI - Plan to switch from Trulicity to [facility] at discharge

## 2023-11-23 NOTE — Assessment & Plan Note (Addendum)
 And adequate diuresis with Lasix 80 mg IV, likely needs dose increase.  Unclear if patient was receiving diuretic at her facility, will likely need dose adjustment upon discharge given quick recurrence of fluid accumulation. - Increase to Lasix 120 mg IV twice daily - Add Metolazone 2.5 mg three time per week - Holding Home regimen while on IV diuresis: Torsemide 60 mg BID and Jardiance 25 mg  - Daily BMP and Mag, with diuresis  - Strict I/Os and daily weights

## 2023-11-23 NOTE — Assessment & Plan Note (Addendum)
 Difficult to assess volume status given body habitus but BNP elevated from baseline, likely secondary to heart failure exacerbation.  Per patient and husband report facility changed her medication but she was unaware how much diuretic she was getting.  Will call and confirm today.  Mentation back to baseline, AM VBG with pCO2 88, above baseline 70-80.  Will continue on BiPAP and consider repeat VBG this evening to trial off for short time.  Reiterated this AM she is not interested in trach. -Continue BiPAP, consider short (30 minutes to 1 hour) trial off to drink fluids and take medications this afternoon pending repeat VBG - Discuss medication regimen with [facility] Care - Continue diuresis as below - Consider ENT consult if patient considers tracheostomy

## 2023-11-23 NOTE — Assessment & Plan Note (Addendum)
 Chronic, appears similar from prior admission. - Wound care consulted, appreciate assistance - Follow-up ABIs

## 2023-11-23 NOTE — Progress Notes (Signed)
 VASCULAR LAB    ABI has been performed.  See CV proc for preliminary results.   Isabelle Matt, RVT 11/23/2023, 1:56 PM

## 2023-11-23 NOTE — Progress Notes (Signed)
 Pt arrived to room 4E25 via stretcher from the ED accompanied by RN and RT. Pt in no apparent cardiac or respiration distress. Bipap in place, vital signs stable(see flowsheet). Pt drowsy but responsive to voice and pain. Physical Assessment performed (see flowsheet). Bed locked and in lowest position with call bell within reach. RN will continue to monitor.

## 2023-11-23 NOTE — Assessment & Plan Note (Addendum)
 RRR but still NPO on BiPAP.  Will start treatment dose Lovenox with plans to transition to Eliquis. - Treatment dose Lovenox - Monitor heart rate closely, may need amiodarone and metoprolol IV

## 2023-11-23 NOTE — Progress Notes (Signed)
 Daily Progress Note Intern Pager: [phone]  Patient name: Chelsea [name] Medical record number: [id number] Date of birth: 1966/01/05 Age: 58 y.o. Gender: female  Primary Care Provider: Pcp, No Consultants: None Code Status: Full code  Pt Overview and Major Events to Date:  2/21: Admitted to have FMTS  Assessment and Plan: Chelsea [name] is a 58 y.o. female with PMHx of chronic hypercapnia/hypoxic respiratory failure requiring 5 L nasal cannula, OHS/OSA requiring nocturnal BiPAP, COPD, HFpEF, A-flutter on Eliquis, T2DM, HTN, HLD, hypothyroidism, restless leg syndrome, and IDA presenting with acute on chronic hypoxic/hypercapnic respiratory failure likely secondary to heart failure exacerbation. Assessment & Plan Acute on chronic hypoxic respiratory failure (HCC) Difficult to assess volume status given body habitus but BNP elevated from baseline, likely secondary to heart failure exacerbation.  Per patient and husband report facility changed her medication but she was unaware how much diuretic she was getting.  Will call and confirm today.  Mentation back to baseline, AM VBG with pCO2 88, above baseline 70-80.  Will continue on BiPAP and consider repeat VBG this evening to trial off for short time.  Reiterated this AM she is not interested in trach. -Continue BiPAP, consider short (30 minutes to 1 hour) trial off to drink fluids and take medications this afternoon pending repeat VBG - Discuss medication regimen with [facility] Care - Continue diuresis as below - Consider ENT consult if patient considers tracheostomy (HFpEF) heart failure with preserved ejection fraction (HCC) And adequate diuresis with Lasix 80 mg IV, likely needs dose increase.  Unclear if patient was receiving diuretic at her facility, will likely need dose adjustment upon discharge given quick recurrence of fluid accumulation. - Increase to Lasix 120 mg IV twice daily - Add Metolazone 2.5 mg three  time per week - Holding Home regimen while on IV diuresis: Torsemide 60 mg BID and Jardiance 25 mg  - Daily BMP and Mag, with diuresis  - Strict I/Os and daily weights  Atrial flutter (HCC) RRR but still NPO on BiPAP.  Will start treatment dose Lovenox with plans to transition to Eliquis. - Treatment dose Lovenox - Monitor heart rate closely, may need amiodarone and metoprolol IV Type 2 diabetes mellitus with diabetic neuropathy, unspecified (HCC) A1c 8.5, taking Lantus 15 at home.  Will start Lantus 10 units and monitor CBGs. - Add Lantus 10U daily + rSSI - Plan to switch from Trulicity to [facility] at discharge Venous stasis dermatitis of both lower extremities Chronic, appears similar from prior admission. - Wound care consulted, appreciate assistance - Follow-up ABIs  Chronic and Stable Problems: COPD: Home Breo and Incruse Anemia: Home Iron supplementation HLD: Home Atorvastatin 20 mg daily Hypothyroidism: Home levothyroxine 150 mcg daily RLS: Add back home ropinirole given baseline mental status GERD: Home pantoprazole 40 mg daily Chronic pain: Holding home oxy given drowsiness, though does not appear she takes this daily (last prescription only 30 days worth, no fill hx in PDMP)   FEN/GI: NPO on BiPAP PPx: Lovenox, may need heparin IV if unable to transition off BiPAP Dispo: Pending respiratory improvement  Subjective:  Patient assessed at bedside with husband present.  Patient without any close on, states she is hot.  Requesting something to drink and to be taken off BiPAP for short time.  Alert and oriented and answering questions appropriately.  Has not had significant urine output with Lasix dosing.  Objective: Temp:  [97.7 F (36.5 C)-99.6 F (37.6 C)] 97.7 F (36.5 C) (02/22 0500)  Pulse Rate:  [71-94] 74 (02/22 0500) Resp:  [15-24] 22 (02/22 0500) BP: (95-120)/(51-72) 104/54 (02/22 0500) SpO2:  [88 %-97 %] 93 % (02/22 0500) FiO2 (%):  [70 %] 70 % (02/22  0745) Weight:  [136.1 kg-148.1 kg] 148.1 kg (02/22 0500) Physical Exam: General: Morbidly obese female laying in bed without close on, BiPAP in place.  NAD. Cardiovascular: Normal sinus rhythm.  Normal rate.  Loud systolic murmur Respiratory: Normal work of breathing on BiPAP.  Lung sounds limited by body habitus. Abdomen: Soft, nontender, nondistended. Extremities: Chronic erythema and skin changes noted on bilateral lower extremities Neuro: A&O x 4.  Appropriately responsive to commands and moves all extremities spontaneously.  Laboratory: Most recent CBC Lab Results  Component Value Date   WBC 6.5 11/23/2023   HGB 11.2 (L) 11/23/2023   HCT 38.0 11/23/2023   MCV 89.6 11/23/2023   PLT 280 11/23/2023   Most recent BMP    Latest Ref Rng & Units 11/23/2023    3:05 AM  BMP  Glucose 70 - 99 mg/dL 161   BUN 6 - 20 mg/dL 35   Creatinine 0.96 - 1.00 mg/dL 0.45   Sodium 409 - 811 mmol/L 142   Potassium 3.5 - 5.1 mmol/L 4.9   Chloride 98 - 111 mmol/L 92   CO2 22 - 32 mmol/L 37   Calcium 8.9 - 10.3 mg/dL 9.4     Other pertinent labs: Mag: 2.6 A1c: 8.5  Imaging/Diagnostic Tests: DG Chest Port 1 View Result Date: 11/22/2023 IMPRESSION: 1. Low lung volumes with bronchovascular crowding. Diffuse bilateral interstitial opacities, likely pulmonary edema. 2. Patchy bibasilar opacities, likely atelectasis. 3. Trace blunting of the right costophrenic angle, which may represent a small pleural effusion. 4. Similar markedly enlarged cardiomediastinal silhouette.     Chelsea Fortis, MD 11/23/2023, 10:05 AM  PGY-2, [facility] FPTS Intern pager: [phone], text pages welcome Secure chat group [facility] Teaching Service

## 2023-11-24 DIAGNOSIS — J9621 Acute and chronic respiratory failure with hypoxia: Secondary | ICD-10-CM | POA: Diagnosis not present

## 2023-11-24 DIAGNOSIS — I5033 Acute on chronic diastolic (congestive) heart failure: Secondary | ICD-10-CM | POA: Diagnosis not present

## 2023-11-24 DIAGNOSIS — J984 Other disorders of lung: Secondary | ICD-10-CM | POA: Diagnosis not present

## 2023-11-24 DIAGNOSIS — E669 Obesity, unspecified: Secondary | ICD-10-CM | POA: Diagnosis not present

## 2023-11-24 LAB — BLOOD GAS, VENOUS
Acid-Base Excess: 20.4 mmol/L — ABNORMAL HIGH (ref 0.0–2.0)
Acid-Base Excess: 20.4 mmol/L — ABNORMAL HIGH (ref 0.0–2.0)
Bicarbonate: 48.4 mmol/L — ABNORMAL HIGH (ref 20.0–28.0)
Bicarbonate: 48.7 mmol/L — ABNORMAL HIGH (ref 20.0–28.0)
Drawn by: 70201
Drawn by: 8326
O2 Saturation: 42.8 %
O2 Saturation: 63.1 %
Patient temperature: 36.4
Patient temperature: 36.8
pCO2, Ven: 66 mm[Hg] — ABNORMAL HIGH (ref 44–60)
pCO2, Ven: 69 mm[Hg] — ABNORMAL HIGH (ref 44–60)
pH, Ven: 7.47 — ABNORMAL HIGH (ref 7.25–7.43)
pH, Ven: 7.45 — ABNORMAL HIGH (ref 7.25–7.43)
pO2, Ven: <31 mm[Hg] — CL (ref 32–45)
pO2, Ven: 34 mm[Hg] (ref 32–45)

## 2023-11-24 LAB — BASIC METABOLIC PANEL
Anion gap: 18 — ABNORMAL HIGH (ref 5–15)
BUN: 46 mg/dL — ABNORMAL HIGH (ref 6–20)
CO2: 32 mmol/L (ref 22–32)
Calcium: 9.2 mg/dL (ref 8.9–10.3)
Chloride: 92 mmol/L — ABNORMAL LOW (ref 98–111)
Creatinine, Ser: 0.79 mg/dL (ref 0.44–1.00)
GFR, Estimated: >60 mL/min (ref >60)
Glucose, Bld: 156 mg/dL — ABNORMAL HIGH (ref 70–99)
Potassium: 4.2 mmol/L (ref 3.5–5.1)
Sodium: 142 mmol/L (ref 135–145)

## 2023-11-24 LAB — GLUCOSE, CAPILLARY
Glucose-Capillary: 159 mg/dL — ABNORMAL HIGH (ref 70–99)
Glucose-Capillary: 130 mg/dL — ABNORMAL HIGH (ref 70–99)
Glucose-Capillary: 160 mg/dL — ABNORMAL HIGH (ref 70–99)
Glucose-Capillary: 171 mg/dL — ABNORMAL HIGH (ref 70–99)

## 2023-11-24 LAB — MAGNESIUM: Magnesium: 2.3 mg/dL (ref 1.7–2.4)

## 2023-11-24 MED ORDER — APIXABAN 5 MG PO TABS
5.0000 mg | ORAL_TABLET | Freq: Two times a day (BID) | ORAL | Status: DC
  Administered 2023-11-24 – 2023-11-26 (×5): 5 mg via ORAL
  Filled 2023-11-24 (×5): qty 1

## 2023-11-24 MED ORDER — LOPERAMIDE HCL 2 MG PO CAPS
2.0000 mg | ORAL_CAPSULE | ORAL | Status: DC | PRN
  Administered 2023-11-24: 2 mg via ORAL
  Filled 2023-11-24: qty 1

## 2023-11-24 MED ORDER — ACETAMINOPHEN 500 MG PO TABS
1000.0000 mg | ORAL_TABLET | Freq: Four times a day (QID) | ORAL | Status: DC | PRN
  Administered 2023-11-24 (×2): 1000 mg via ORAL
  Filled 2023-11-24 (×2): qty 2

## 2023-11-24 NOTE — Assessment & Plan Note (Addendum)
 Down to 1 L since admission with increase in Lasix dosage.  Will continue this dosage for the time being until respiration improves and patient is euvolemic.  Will adjust home dosage accordingly. -Continue Lasix 120 mg IV twice daily -Continue metolazone 2.5 mg three time per week - Holding Home regimen while on IV diuresis: Torsemide 60 mg BID  - continue home Jardiance 25 mg  - Daily BMP and Mag, with diuresis  - Strict I/Os and daily weights

## 2023-11-24 NOTE — Assessment & Plan Note (Addendum)
 Blood sugars stable in the mid 100s, continue plan as below - Lantus 10U daily + rSSI - Plan to switch from Trulicity to [facility] at discharge

## 2023-11-24 NOTE — Consult Note (Signed)
 WOC consulted for LE edema, weeping.  Noted to have normal ABIs No other wounds reported Discussed with admitting team Unna's boot trial for compression to relieve edema and possibly lessen likelihood of patient acquiring LE wounds  Re consult if needed, will not follow at this time. Thanks  Antwain Caliendo [employer], RN,CWOCN, CNS, CWON-AP [phone])

## 2023-11-24 NOTE — Assessment & Plan Note (Addendum)
 RRR, patient now able to take medications while on BiPAP. -Transition to Eliquis today - Monitor heart rate closely, may need amiodarone and metoprolol IV -continuing home amiodarone and metoprolol

## 2023-11-24 NOTE — Progress Notes (Signed)
 Daily Progress Note Intern Pager: [phone]  Patient name: Chelsea [name] Medical record number: [id number] Date of birth: Feb 03, 1966 Age: 58 y.o. Gender: female  Primary Care Provider: Pcp, No Consultants: None Code Status: Full  Pt Overview and Major Events to Date:  2/21: Admitted  Assessment and Plan:  This patient is a 58 year old female well-known to our service with a past medical history of chronic hypercapnia/hypoxic respiratory failure requiring 5 L nasal cannula, OHS/OSA requiring, COPD, HFpEF, A-flutter on Eliquis, T2DM, hypertension, hyperlipidemia, hypothyroidism, RLS and IDA presenting with acute on chronic hypoxic hypercapnic respiratory failure likely secondary to heart failure exacerbation. Assessment & Plan Acute on chronic hypoxic respiratory failure (HCC) Patient remained compliant with BiPAP overnight, p.m. VBG yesterday was not favorable to trial off BiPAP, will repeat this afternoon to consider trial for meds and fluids.  Will contact yesterday's team to confirm whether or not medication regimen has been confirmed with [facility], if not we will contact facility again.  Per pharmacy, patient has been able to take p.o. medications, will transition medications appropriately. -Continue BiPAP, consider short (30 minutes to 1 hour) trial off to drink fluids possibly add a diet pending VBG - Discuss medication regimen with [facility] - Continue diuresis as below - Consider ENT consult if patient considers tracheostomy (HFpEF) heart failure with preserved ejection fraction (HCC) Down to 1 L since admission with increase in Lasix dosage.  Will continue this dosage for the time being until respiration improves and patient is euvolemic.  Will adjust home dosage accordingly. -Continue Lasix 120 mg IV twice daily -Continue metolazone 2.5 mg three time per week - Holding Home regimen while on IV diuresis: Torsemide 60 mg BID  - continue home  Jardiance 25 mg  - Daily BMP and Mag, with diuresis  - Strict I/Os and daily weights  Atrial flutter (HCC) RRR, patient now able to take medications while on BiPAP. -Transition to Eliquis today - Monitor heart rate closely, may need amiodarone and metoprolol IV -continuing home amiodarone and metoprolol Type 2 diabetes mellitus with diabetic neuropathy, unspecified (HCC) Blood sugars stable in the mid 100s, continue plan as below - Lantus 10U daily + rSSI - Plan to switch from Trulicity to [facility] at discharge Venous stasis dermatitis of both lower extremities Chronic, appears similar from prior admission.  ABIs performed yesterday indicate noncompressible arteries on the right, and mild PAD on left. - Wound care consulted, appreciate assistance  Chronic and Stable Problems:  COPD: On Breo and Incruse Anemia: Home iron supplementation HLD: Home atorvastatin 20 mg Hypothyroidism: Levothyroxine 150 mcg RLS: Continue home ropinirole given baseline mental status GERD: Home plan metoprolol 40 mg Chronic pain: Holding home Oxy given drowsiness  FEN/GI: N.p.o. on BiPAP PPx: Lovenox, may need heparin IV if unable to transition off BiPAP Dispo: Return to ALF  pending clinical improvement .   Subjective:  Patient asleep with BiPAP in place on exam this morning.  Seal was not appropriate so adjusted mask while in the room  Objective: Temp:  [97.6 F (36.4 C)-98.7 F (37.1 C)] 97.6 F (36.4 C) (02/23 0335) Pulse Rate:  [63-87] 63 (02/23 0335) Resp:  [18-22] 20 (02/23 0335) BP: (110-143)/(65-81) 130/65 (02/23 0335) SpO2:  [94 %-100 %] 100 % (02/23 0335) FiO2 (%):  [70 %] 70 % (02/22 2335) Physical Exam: General: Asleep, no distress Cardiovascular: RRR, no M/R/G Respiratory: Difficult to auscultate given BiPAP and body habitus.  No appreciated signs of fluid overload Abdomen:  Flat, soft, nontender Extremities: Bilateral venous stasis changes in the lower extremities, difficult to  assess for edema based on body habitus.  Unable to palpate pulses  Laboratory: Most recent CBC Lab Results  Component Value Date   WBC 6.5 11/23/2023   HGB 11.2 (L) 11/23/2023   HCT 38.0 11/23/2023   MCV 89.6 11/23/2023   PLT 280 11/23/2023   Most recent BMP    Latest Ref Rng & Units 11/24/2023    3:21 AM  BMP  Glucose 70 - 99 mg/dL 161   BUN 6 - 20 mg/dL 46   Creatinine 0.96 - 1.00 mg/dL 0.45   Sodium 409 - 811 mmol/L 142   Potassium 3.5 - 5.1 mmol/L 4.2   Chloride 98 - 111 mmol/L 92   CO2 22 - 32 mmol/L 32   Calcium 8.9 - 10.3 mg/dL 9.2    Gerrit Heck, DO 11/24/2023, 7:32 AM  PGY-1, [facility] FPTS Intern pager: [phone], text pages welcome Secure chat group [facility] Teaching Service

## 2023-11-24 NOTE — Progress Notes (Signed)
 Pt not ready to go on BiPAP at this time. BiPAP on standby in room. No distress noted at this time.

## 2023-11-24 NOTE — Progress Notes (Signed)
   11/24/23 0005  BiPAP/CPAP/SIPAP  Reason BIPAP/CPAP not in use Other(comment) (in dialysis)

## 2023-11-24 NOTE — Assessment & Plan Note (Addendum)
 Patient remained compliant with BiPAP overnight, p.m. VBG yesterday was not favorable to trial off BiPAP, will repeat this afternoon to consider trial for meds and fluids.  Will contact yesterday's team to confirm whether or not medication regimen has been confirmed with [facility], if not we will contact facility again.  Per pharmacy, patient has been able to take p.o. medications, will transition medications appropriately. -Continue BiPAP, consider short (30 minutes to 1 hour) trial off to drink fluids possibly add a diet pending VBG - Discuss medication regimen with [facility] - Continue diuresis as below - Consider ENT consult if patient considers tracheostomy

## 2023-11-24 NOTE — Assessment & Plan Note (Signed)
 Chronic, appears similar from prior admission.  ABIs performed yesterday indicate noncompressible arteries on the right, and mild PAD on left. - Wound care consulted, appreciate assistance

## 2023-11-25 ENCOUNTER — Other Ambulatory Visit (HOSPITAL_COMMUNITY): Payer: Self-pay

## 2023-11-25 ENCOUNTER — Telehealth (HOSPITAL_COMMUNITY): Payer: Self-pay | Admitting: Pharmacy Technician

## 2023-11-25 DIAGNOSIS — I5033 Acute on chronic diastolic (congestive) heart failure: Secondary | ICD-10-CM | POA: Diagnosis not present

## 2023-11-25 DIAGNOSIS — J9621 Acute and chronic respiratory failure with hypoxia: Secondary | ICD-10-CM | POA: Diagnosis not present

## 2023-11-25 LAB — GLUCOSE, CAPILLARY
Glucose-Capillary: 165 mg/dL — ABNORMAL HIGH (ref 70–99)
Glucose-Capillary: 226 mg/dL — ABNORMAL HIGH (ref 70–99)
Glucose-Capillary: 157 mg/dL — ABNORMAL HIGH (ref 70–99)
Glucose-Capillary: 196 mg/dL — ABNORMAL HIGH (ref 70–99)

## 2023-11-25 LAB — BASIC METABOLIC PANEL
Anion gap: 10 (ref 5–15)
BUN: 39 mg/dL — ABNORMAL HIGH (ref 6–20)
CO2: 39 mmol/L — ABNORMAL HIGH (ref 22–32)
Calcium: 8.6 mg/dL — ABNORMAL LOW (ref 8.9–10.3)
Chloride: 88 mmol/L — ABNORMAL LOW (ref 98–111)
Creatinine, Ser: 0.86 mg/dL (ref 0.44–1.00)
GFR, Estimated: >60 mL/min (ref >60)
Glucose, Bld: 148 mg/dL — ABNORMAL HIGH (ref 70–99)
Potassium: 3.5 mmol/L (ref 3.5–5.1)
Sodium: 137 mmol/L (ref 135–145)

## 2023-11-25 LAB — MAGNESIUM: Magnesium: 1.9 mg/dL (ref 1.7–2.4)

## 2023-11-25 MED ORDER — ACETAMINOPHEN 500 MG PO TABS
1000.0000 mg | ORAL_TABLET | Freq: Three times a day (TID) | ORAL | Status: DC
  Administered 2023-11-25 – 2023-11-26 (×4): 1000 mg via ORAL
  Filled 2023-11-25 (×4): qty 2

## 2023-11-25 MED ORDER — TORSEMIDE 20 MG PO TABS
80.0000 mg | ORAL_TABLET | Freq: Two times a day (BID) | ORAL | Status: DC
Start: 2023-11-25 — End: 2023-11-27
  Administered 2023-11-25 – 2023-11-26 (×3): 80 mg via ORAL
  Filled 2023-11-25 (×3): qty 4

## 2023-11-25 MED ORDER — OXYCODONE HCL 5 MG PO TABS
5.0000 mg | ORAL_TABLET | Freq: Four times a day (QID) | ORAL | Status: DC | PRN
  Administered 2023-11-25 – 2023-11-26 (×2): 5 mg via ORAL
  Filled 2023-11-25 (×2): qty 1

## 2023-11-25 MED ORDER — POTASSIUM CHLORIDE CRYS ER 20 MEQ PO TBCR
40.0000 meq | EXTENDED_RELEASE_TABLET | Freq: Two times a day (BID) | ORAL | Status: AC
Start: 2023-11-25 — End: 2023-11-25
  Administered 2023-11-25 (×2): 40 meq via ORAL
  Filled 2023-11-25 (×2): qty 2

## 2023-11-25 MED ORDER — MAGNESIUM SULFATE 2 GM/50ML IV SOLN
2.0000 g | Freq: Once | INTRAVENOUS | Status: AC
  Administered 2023-11-25: 2 g via INTRAVENOUS
  Filled 2023-11-25: qty 50

## 2023-11-25 NOTE — Assessment & Plan Note (Addendum)
 Patient continues to diurese appropriately on higher levels of Lasix with addition of metolazone.  She is now down 2.2 L total from admission.  Creatinine unchanged, patient tolerating well.  Confirmed dose of torsemide with ALF, last dose was 2/21 at approximately 1700.  This is likely not an adequate dose for ongoing diuresis. -transition to torsemide 80 mg BID, will plan to discharge on this dose -Continue metolazone 2.5 mg three time per week, will continue on discharge - continue home Jardiance 25 mg  - Daily BMP and Mag, with diuresis  - Strict I/Os and daily weights

## 2023-11-25 NOTE — Assessment & Plan Note (Signed)
 Patient continues to be compliant with BiPAP overnight, p.m. VBG from yesterday greatly improved.  Patient was given a diet and did well.  Patient continues to tolerate meals could lengthen time off of BiPAP or go to BiPAP at night only. -Continue BiPAP -Patient can be off of BiPAP for 30 minutes to 1 hour for meals and medications -Sachin support via nasal cannula during meals - Discuss medication regimen with [facility] - Continue diuresis as below - Consider ENT consult if patient considers tracheostomy

## 2023-11-25 NOTE — Telephone Encounter (Signed)
 Patient Product/process development scientist completed.    The patient is insured through [employer]. Patient has Medicare and is not eligible for a copay card, but may be able to apply for patient assistance or Medicare RX Payment Plan (Patient Must reach out to their plan, if eligible for payment plan), if available.    Ran test claim for Ozempic 2 mg/3 ml and the current 30 day co-pay is $0.00.  Ran test claim for Mounjaro 2.5 mg/0.5 ml and the current 30 day co-pay is $0.00.  This test claim was processed through [facility]- copay amounts may vary at other pharmacies due to pharmacy/plan contracts, or as the patient moves through the different stages of their insurance plan.     Roland Earl, CPHT Pharmacy Technician III Certified Patient Advocate [facility] Patient Advocate Team Direct Number: [phone]  Fax: [phone]

## 2023-11-25 NOTE — Assessment & Plan Note (Signed)
 Blood sugars stable in the mid 100s, continue plan as below - Lantus 10U daily + rSSI - Plan to switch from Trulicity to [facility] at discharge

## 2023-11-25 NOTE — Progress Notes (Signed)
 Daily Progress Note Intern Pager: [phone]  Patient name: Chelsea [name] Medical record number: [id number] Date of birth: 12-26-1965 Age: 58 y.o. Gender: female  Primary Care Provider: Pcp, No Consultants: None Code Status: Full  Pt Overview and Major Events to Date:  2/21: Admitted  Assessment and Plan:  Patient is a 58 year old female well-known to our service with a past medical history of chronic hypercapnia/hypoxic respiratory failure requiring 5 L nasal cannula, OHS/OSA, COPD, HFpEF, A-flutter on Eliquis, T2DM, hypertension, hyperlipidemia, hypothyroidism, RLS and IDA presenting with acute on chronic hypoxic hypercapnic respiratory failure likely secondary to heart failure exacerbation. Assessment & Plan Acute on chronic hypoxic respiratory failure (HCC) Patient continues to be compliant with BiPAP overnight, p.m. VBG from yesterday greatly improved.  Patient was given a diet and did well.  Patient continues to tolerate meals could lengthen time off of BiPAP or go to BiPAP at night only. -Continue BiPAP -Patient can be off of BiPAP for 30 minutes to 1 hour for meals and medications -Sachin support via nasal cannula during meals - Discuss medication regimen with [facility] - Continue diuresis as below - Consider ENT consult if patient considers tracheostomy (HFpEF) heart failure with preserved ejection fraction (HCC) Patient continues to diurese appropriately on higher levels of Lasix with addition of metolazone.  She is now down 2.2 L total from admission.  Creatinine unchanged, patient tolerating well.  Confirmed dose of torsemide with ALF, last dose was 2/21 at approximately 1700.  This is likely not an adequate dose for ongoing diuresis. -transition to torsemide 80 mg BID, will plan to discharge on this dose -Continue metolazone 2.5 mg three time per week, will continue on discharge - continue home Jardiance 25 mg  - Daily BMP and Mag, with diuresis  -  Strict I/Os and daily weights  Atrial flutter (HCC) RRR, patient now able to take medications while on BiPAP. -Transition to Eliquis today - Monitor heart rate closely, may need amiodarone and metoprolol IV -continuing home amiodarone and metoprolol Type 2 diabetes mellitus with diabetic neuropathy, unspecified (HCC) Blood sugars stable in the mid 100s, continue plan as below - Lantus 10U daily + rSSI - Plan to switch from Trulicity to [facility] at discharge Venous stasis dermatitis of both lower extremities Improving with diuresis, wound care applied Unna boot yesterday. - Wound care consulted, appreciate assistance  Chronic and Stable Problems:  COPD: On Breo and Incruse Anemia: Home iron supplementation HLD: Home atorvastatin 20 mg Hypothyroidism: Levothyroxine 150 mcg RLS: Continue home ropinirole GERD: Home pantoprazole 40 mg Chronic pain: added on oxycodone 5mg  as needed for pain given improved mentation  FEN/GI: Heart healthy PPx: Eliquis Dispo: Return to ALF  pending clinical improvement .   Subjective:  Patient is awake with low flow nasal cannula in place.  She is comfortable without any increased work of breathing, talking in full sentences.  Objective: Temp:  [97.5 F (36.4 C)-98.2 F (36.8 C)] 98 F (36.7 C) (02/24 0304) Pulse Rate:  [56-90] 90 (02/24 0304) Resp:  [17-20] 20 (02/24 0304) BP: (96-113)/(51-70) 103/63 (02/24 0304) SpO2:  [90 %-100 %] 90 % (02/24 0304) Weight:  [142.9 kg] 142.9 kg (02/24 0353) Physical Exam: General: Well-appearing female, no distress Cardiovascular: RRR, no M/R/G Respiratory: CTAB, no increased work of breathing Abdomen: Flat, soft, nontender. Extremities: Bilateral venous stasis wounds, without weeping or exudate.  2+ pulses bilaterally.  Laboratory: Most recent CBC Lab Results  Component Value Date   WBC 6.5 11/23/2023  HGB 11.2 (L) 11/23/2023   HCT 38.0 11/23/2023   MCV 89.6 11/23/2023   PLT 280 11/23/2023   Most  recent BMP    Latest Ref Rng & Units 11/25/2023    3:26 AM  BMP  Glucose 70 - 99 mg/dL 573   BUN 6 - 20 mg/dL 39   Creatinine 2.20 - 1.00 mg/dL 2.54   Sodium 270 - 623 mmol/L 137   Potassium 3.5 - 5.1 mmol/L 3.5   Chloride 98 - 111 mmol/L 88   CO2 22 - 32 mmol/L 39   Calcium 8.9 - 10.3 mg/dL 8.6     Gerrit Heck, DO 11/25/2023, 7:35 AM  PGY-1, [place] [facility] FPTS Intern pager: [phone], text pages welcome Secure chat group [facility] Teaching Service

## 2023-11-25 NOTE — Assessment & Plan Note (Signed)
 RRR, patient now able to take medications while on BiPAP. -Transition to Eliquis today - Monitor heart rate closely, may need amiodarone and metoprolol IV -continuing home amiodarone and metoprolol

## 2023-11-25 NOTE — Progress Notes (Signed)
Patient on BiPAP at this time.  

## 2023-11-25 NOTE — Discharge Instructions (Signed)
 Dear Chelsea [name],   Thank you for letting us participate in your care! In this section, you will find a brief hospital admission summary of why you were admitted to the hospital, what happened during your admission, your diagnosis/diagnoses, and recommended follow up.  Primary diagnosis: Acute on chronic hypoxic hypercapnic respiratory failure Treatment plan: Continue with BiPAP at night and anytime you are asleep, and daily oxygen support as needed. Secondary diagnosis: Acute on chronic HFpEF Treatment plan: We have increased your torsemide from 60 mg twice a day to 80 mg twice a day.  We have also added a medicine called metolazone 2.5 mg that you should take 3 times a week to help keep any extra fluid off of your lungs.   POST-HOSPITAL & CARE INSTRUCTIONS We recommend following up with your PCP within 1 week from being discharged from the hospital. Please let PCP/Specialists know of any changes in medications that were made which you will be able to see in the medications section of this packet. We have also changed your trulicitiy to [facility] a medication in the same family that will work better for you in the long run.  DOCTOR'S APPOINTMENTS & FOLLOW UP No future appointments.   Thank you for choosing [facility]! Take care and be well!  Family Medicine Teaching Service Inpatient Team [place]  [facility]  [address] [place], [place] [address] [phone]

## 2023-11-25 NOTE — Assessment & Plan Note (Signed)
 Improving with diuresis, wound care applied Unna boot yesterday. - Wound care consulted, appreciate assistance

## 2023-11-25 NOTE — Progress Notes (Signed)
 Orthopedic Tech Progress Note Patient Details:  Chelsea [name] 03/14/1966 [id number] Applied unna boots per order.  Ortho Devices Type of Ortho Device: Radio broadcast assistant Ortho Device/Splint Location: BLE Ortho Device/Splint Interventions: Ordered, Application, Adjustment   Post Interventions Patient Tolerated: Well Instructions Provided: Adjustment of device, Care of device  [name] 11/25/2023, 6:03 PM

## 2023-11-26 DIAGNOSIS — I5033 Acute on chronic diastolic (congestive) heart failure: Secondary | ICD-10-CM | POA: Diagnosis not present

## 2023-11-26 DIAGNOSIS — I872 Venous insufficiency (chronic) (peripheral): Secondary | ICD-10-CM | POA: Diagnosis not present

## 2023-11-26 DIAGNOSIS — E1140 Type 2 diabetes mellitus with diabetic neuropathy, unspecified: Secondary | ICD-10-CM

## 2023-11-26 DIAGNOSIS — Z794 Long term (current) use of insulin: Secondary | ICD-10-CM | POA: Diagnosis not present

## 2023-11-26 LAB — GLUCOSE, CAPILLARY
Glucose-Capillary: 201 mg/dL — ABNORMAL HIGH (ref 70–99)
Glucose-Capillary: 235 mg/dL — ABNORMAL HIGH (ref 70–99)
Glucose-Capillary: 171 mg/dL — ABNORMAL HIGH (ref 70–99)

## 2023-11-26 LAB — BASIC METABOLIC PANEL WITH GFR
Anion gap: 14 (ref 5–15)
BUN: 38 mg/dL — ABNORMAL HIGH (ref 6–20)
CO2: 40 mmol/L — ABNORMAL HIGH (ref 22–32)
Calcium: 8.8 mg/dL — ABNORMAL LOW (ref 8.9–10.3)
Chloride: 82 mmol/L — ABNORMAL LOW (ref 98–111)
Creatinine, Ser: 1.12 mg/dL — ABNORMAL HIGH (ref 0.44–1.00)
GFR, Estimated: 57 mL/min — ABNORMAL LOW
Glucose, Bld: 181 mg/dL — ABNORMAL HIGH (ref 70–99)
Potassium: 3.7 mmol/L (ref 3.5–5.1)
Sodium: 136 mmol/L (ref 135–145)

## 2023-11-26 LAB — MAGNESIUM: Magnesium: 2.1 mg/dL (ref 1.7–2.4)

## 2023-11-26 MED ORDER — ACETAMINOPHEN 500 MG PO TABS: 1000.0000 mg | ORAL_TABLET | Freq: Three times a day (TID) | ORAL | Status: DC

## 2023-11-26 MED ORDER — MOUNJARO 2.5 MG/0.5ML ~~LOC~~ SOAJ: 2.5000 mg | SUBCUTANEOUS | Status: DC

## 2023-11-26 MED ORDER — POTASSIUM CHLORIDE CRYS ER 20 MEQ PO TBCR
40.0000 meq | EXTENDED_RELEASE_TABLET | Freq: Once | ORAL | Status: AC
  Administered 2023-11-26: 40 meq via ORAL
  Filled 2023-11-26: qty 2

## 2023-11-26 MED ORDER — TORSEMIDE 40 MG PO TABS: 80.0000 mg | ORAL_TABLET | Freq: Two times a day (BID) | ORAL | Status: DC

## 2023-11-26 MED ORDER — METOLAZONE 2.5 MG PO TABS: 2.5000 mg | ORAL_TABLET | ORAL | Status: DC

## 2023-11-26 NOTE — Progress Notes (Signed)
 Daily Progress Note Intern Pager: [phone]  Patient name: Chelsea [name] Medical record number: [id number] Date of birth: Dec 07, 1965 Age: 58 y.o. Gender: female  Primary Care Provider: Pcp, No Consultants: WOC Code Status: Full  Pt Overview and Major Events to Date:  2/21: Admitted  Assessment and Plan:  This patient is a 58 year old female well-known to our service with a PMH of chronic hypercapnic hypoxic respiratory failure requiring 5 L nasal cannula oxygen support, OHS/OHA, COPD, HFpEF, A-flutter on Eliquis, T2DM, HTN, HLD, hypothyroidism, RLS and IDA presenting with acute on chronic hypoxic hypercapnic respiratory failure likely secondary to heart failure exacerbation.  Appears medically stable for discharge back to ALF when approved. Assessment & Plan Acute on chronic hypoxic respiratory failure (HCC) Patient now tolerating home oxygen requirement during the day.  Appears to be at baseline, stable to discharge when authorized to return to ALF. -Continue BiPAP nightly -Home oxygen requirement during the day and while awake. - Continue diuresis as below - Consider ENT consult if patient considers tracheostomy (HFpEF) heart failure with preserved ejection fraction [facility]) Transition patient to 80 mg torsemide plus metolazone yesterday.  She has had continued appropriate fluid output with an additional 1.3 L of diuresis accomplished yesterday.  Confirmed with her facility yesterday that she was getting 60 mg twice daily of torsemide. -Continue torsemide 80 mg BID, will plan to discharge on this dose -Continue metolazone 2.5 mg three time per week, will continue on discharge - continue home Jardiance 25 mg  - Daily BMP and Mag, with diuresis  - Strict I/Os and daily weights  Atrial flutter (HCC) Remains in normal sinus rhythm. -Continue Eliquis - Monitor heart rate closely, may need amiodarone and metoprolol IV -continuing home amiodarone and metoprolol Type 2 diabetes  mellitus with diabetic neuropathy, unspecified (HCC) Blood sugars stable in the mid 100s, continue plan as below - Lantus 10U daily + rSSI - Plan to switch from Trulicity to [facility] at discharge Venous stasis dermatitis of both lower extremities Improving with diuresis, wound care applied Unna boot yesterday. - Wound care consulted, appreciate assistance  Chronic and Stable Problems:  COPD: Continue Breo and Incruse inhalers Anemia: Home iron supplementation HLD: Home atorvastatin 20 mg Hypothyroidism levothyroxine 150 mcg RLS: Continue home ropinirole GERD: Home pantoprazole 40 mg Chronic pain: Added on oxycodone 5 mg PRN, however patient's blood pressure dropped with administration, has been held since then.   FEN/GI: Heart healthy PPx: Eliquis Dispo: Return to ALF   pending authorization .    Subjective:  Patient feels well this morning, and reports that the pain in her feet is better controlled today after application of wraps.  Discussed her readiness for discharge and she feels that she could definitely go today but requests that if she would not be transferred until late in the evening she would prefer to wait until the next morning.  I agree that this seems reasonable and I will try to coordinate things appropriately.  Objective: Temp:  [97.8 F (36.6 C)-98.3 F (36.8 C)] 98 F (36.7 C) (02/25 0438) Pulse Rate:  [58-66] 58 (02/25 0438) Resp:  [17-22] 22 (02/25 0500) BP: (89-111)/(54-79) 104/73 (02/25 0438) SpO2:  [94 %-96 %] 96 % (02/25 0438) FiO2 (%):  [70 %] 70 % (02/25 0325) Weight:  [141.5 kg] 141.5 kg (02/25 0500) Physical Exam: General: Well-appearing, no distress Cardiovascular: RRR, no M/R/G Respiratory: CTAB, no increased work of breathing.  Comfortable on home oxygen Abdomen: Flat, soft, nontender Extremities: Bilateral lower extremities  with [employer] in place.  2+ pulse on the left, 1+ pulse on the right.  Consistent with ABI findings.   NVI  Laboratory: Most recent CBC Lab Results  Component Value Date   WBC 6.5 11/23/2023   HGB 11.2 (L) 11/23/2023   HCT 38.0 11/23/2023   MCV 89.6 11/23/2023   PLT 280 11/23/2023   Most recent BMP    Latest Ref Rng & Units 11/26/2023    3:40 AM  BMP  Glucose 70 - 99 mg/dL 811   BUN 6 - 20 mg/dL 38   Creatinine 9.14 - 1.00 mg/dL 7.82   Sodium 956 - 213 mmol/L 136   Potassium 3.5 - 5.1 mmol/L 3.7   Chloride 98 - 111 mmol/L 82   CO2 22 - 32 mmol/L 40   Calcium 8.9 - 10.3 mg/dL 8.8     Gerrit Heck, DO 11/26/2023, 7:27 AM  PGY-1, [place] [facility] FPTS Intern pager: [phone], text pages welcome Secure chat group [facility] Teaching Service

## 2023-11-26 NOTE — Discharge Summary (Signed)
 Family Medicine Teaching [facility] Discharge Summary  Patient name: Chelsea [name] Medical record number: [id number] Date of birth: Nov 18, 1965 Age: 58 y.o. Gender: female Date of Admission: 11/22/2023  Date of Discharge: 11/26/2023 Admitting Physician: Leighton Roach McDiarmid, MD  Primary Care Provider: Pcp, No Consultants: None  Indication for Hospitalization: Acute on Chronic hypoxic hypercapnic respiratory failure  Discharge Diagnoses/Problem List:  Principal Problem for Admission: Acute on Chronic diastolic heart failure Other Problems addressed during stay:  Principal Problem:   Acute on chronic diastolic heart failure (HCC) Active Problems:   (HFpEF) heart failure with preserved ejection fraction (HCC)   Obesity hypoventilation syndrome (HCC)   Lymphedema   Atrial flutter (HCC)   Neuropathy due to type 2 diabetes mellitus (HCC)   Chronic respiratory failure with hypercapnia (HCC)   Limitation of activity due to disability   Restrictive lung disease secondary to obesity   Venous stasis dermatitis of both lower extremities   Class 3 severe obesity due to excess calories with serious comorbidity and body mass index (BMI) of 60.0 to 69.9 in adult [facility])   COPD (chronic obstructive pulmonary disease) with chronic bronchitis (HCC)   On home oxygen therapy   OSA on CPAP   Type 2 diabetes mellitus with diabetic neuropathy, unspecified (HCC)   Acute on chronic hypoxic respiratory failure (HCC)   Nocturnal hypoxemia due to obesity    Brief Hospital Course:  Chelsea [name] is a 58 y.o. female who was admitted to the [facility] Medicine Teaching Service at [facility] for acute on chronic respiratory failure. Hospital course is outlined below by problem.    Acute Respiratory Failure  Patient presented to the hospital from her ALF, due to increased drowsiness and was reported to be hypoxic to the 40s. She was placed on BiPAP  in the emergency department. VBGs showed pCO2 of 91,  white count elevated to 10.4 and a normal lactic acid level.  Chest x-ray showed bilateral opacities suspicious for pulmonary edema. Patient was treated with Solu-Medrol, mag sulfate and given 40 mg IV Lasix given her history of HFpEF. On admission she was started on BiPAP for goal oxygen saturation of 88 to 92%.  Given her somnolence any CNS acting medications were withheld, but were gradually restarted as mentation improved.  BiPAP was weaned appropriately, first to allow for 30 minutes to an hour for meals and medication passes, then to low flow nasal cannula O2 support while awake with BiPAP when sleeping. At time of discharge she was stable on her home regimen.  HFpEF Pts presented with increased lower extremity edema bilaterally. BNP was elevated at 341 and CXR suspicious for pulmonary Edema. Patient received 40 mg IV lasix x2 in the ED due to concern of volume overload. Her lasix was redosed and advanced to 120 mg of IV Lasix to twice daily, plus 2.5 mg of metolazone. She tolerated this regimen well, and diuresed appropriately. Daily weights, strict I/Os, BMP and Mag were monitored. Overall patient was diuresed ~ 5.3 L over the course of her hospitalization. Discharge regimen is torsemide 80 mg BID, and metolazone 2.5 mg 3 times weekly.   Bilateral venous stasis wounds On admission patient was noted to have bilateral discoloration of the lower extremity with skin changes and weeping consistent with venous stasis wounds.  The weeping improved with diuresis, however given patient's multiple risk factors, ABIs were obtained which showed mild tibial disease on the left and noncompressible arteries on the right.  Wound care was consulted for management  while inpatient.  They recommended Unna boots to help control edema and avoid further ulceration.  Other chronic conditions were medically managed with home medications and formulary alternatives as necessary  PCP Follow-up Recommendations: Recheck BMP  outpatient, monitoring Cr and K  Ensure follow-up with Pulmonology  Review medications and administration carefully for changes Increase Mounjaro dosing as tolerated Decrease torsemide dose if able   Disposition: Return to ALF  Discharge Condition: Improved  Discharge Exam:  Vitals:   11/26/23 0438 11/26/23 0500  BP: 104/73   Pulse: (!) 58   Resp: 18 (!) 22  Temp: 98 F (36.7 C)   SpO2: 96%    General: Well appearing, no distress Cardiac: RRR, no m/r/g Respiratory: CTAB, no increased WOB Extremities: 1+ pulse in the RLE, 2+ in the LLE, consistent with ABI findings. NVI bilaterally  Significant Procedures: none  Significant Labs and Imaging:  No results for input(s): "WBC", "HGB", "HCT", "PLT" in the last 48 hours. Recent Labs  Lab 11/25/23 0326 11/26/23 0340  NA 137 136  K 3.5 3.7  CL 88* 82*  CO2 39* 40*  GLUCOSE 148* 181*  BUN 39* 38*  CREATININE 0.86 1.12*  CALCIUM 8.6* 8.8*  MG 1.9 2.1    Results/Tests Pending at Time of Discharge: none  Discharge Medications:  Allergies as of 11/26/2023       Reactions   Iodinated Contrast Media Anaphylaxis   Penicillins Anaphylaxis   Patient tolerates cefepime (08/2017)   Insulin Lispro Other (See Comments)   Adder per MAR from SNF   Minoxidil Hives        Medication List     STOP taking these medications    oxyCODONE-acetaminophen 10-325 MG tablet Commonly known as: PERCOCET   Trulicity 1.5 MG/0.5ML Soaj Generic drug: Dulaglutide       TAKE these medications    acetaminophen 500 MG tablet Commonly known as: TYLENOL Take 2 tablets (1,000 mg total) by mouth every 8 (eight) hours.   albuterol (2.5 MG/3ML) 0.083% nebulizer solution Commonly known as: PROVENTIL Take 3 mLs (2.5 mg total) by nebulization every 6 (six) hours as needed for wheezing or shortness of breath (I50.32).   amiodarone 200 MG tablet Commonly known as: PACERONE Take 1 tablet (200 mg total) by mouth daily.   atorvastatin 20  MG tablet Commonly known as: LIPITOR Take 20 mg by mouth every evening.   buPROPion ER 100 MG 12 hr tablet Commonly known as: WELLBUTRIN SR Take 1 tablet (100 mg total) by mouth 2 (two) times daily.   busPIRone 7.5 MG tablet Commonly known as: BUSPAR Take 1 tablet (7.5 mg total) by mouth 2 (two) times daily.   Cholecalciferol 125 MCG (5000 UT) Tabs Take 1 tablet (5,000 Units total) by mouth daily. What changed: when to take this   Eliquis 5 MG Tabs tablet Generic drug: apixaban Take 5 mg by mouth 2 (two) times daily.   empagliflozin 25 MG Tabs tablet Commonly known as: JARDIANCE Take 1 tablet (25 mg total) by mouth daily. What changed: when to take this   ferrous sulfate 325 (65 FE) MG EC tablet Take 325 mg by mouth in the morning.   insulin glargine 100 UNIT/ML Solostar Pen Commonly known as: LANTUS Inject 15 Units into the skin in the morning.   levothyroxine 150 MCG tablet Commonly known as: SYNTHROID Take 1 tablet (150 mcg total) by mouth daily at 6 (six) AM. What changed: when to take this   loperamide 2 MG capsule Commonly known  as: IMODIUM Take 2 mg by mouth every 4 (four) hours as needed for diarrhea or loose stools.   loratadine 10 MG tablet Commonly known as: CLARITIN Take 1 tablet (10 mg total) by mouth daily.   melatonin 3 MG Tabs tablet Take 3 mg by mouth at bedtime.   metolazone 2.5 MG tablet Commonly known as: ZAROXOLYN Take 1 tablet (2.5 mg total) by mouth 3 (three) times a week. Start taking on: November 27, 2023   metoprolol succinate 50 MG 24 hr tablet Commonly known as: TOPROL-XL Take 1 tablet (50 mg total) by mouth daily. Take with or immediately following a meal. What changed: when to take this   montelukast 10 MG tablet Commonly known as: SINGULAIR Take 10 mg by mouth at bedtime.   Mounjaro 2.5 MG/0.5ML Pen Generic drug: tirzepatide Inject 2.5 mg into the skin once a week.   multivitamin with minerals Tabs tablet Take 1 tablet  by mouth in the morning.   NON FORMULARY Apply BIPAP/CPAP every night at bedtime.   omeprazole 20 MG capsule Commonly known as: PRILOSEC Take 20 mg by mouth daily.   ondansetron 4 MG/2ML Soln injection Commonly known as: ZOFRAN Inject 4 mg into the vein every 4 (four) hours as needed for nausea or vomiting.   OXYGEN Inhale 5 L/min into the lungs at bedtime. Connect to BIPAP/CPAP every night   Potassium Chloride ER 20 MEQ Tbcr Take 2 tablets by mouth in the morning.   rOPINIRole 0.5 MG tablet Commonly known as: REQUIP Take 1 tablet (0.5 mg total) by mouth at bedtime.   Torsemide 40 MG Tabs Take 80 mg by mouth 2 (two) times daily. What changed:  medication strength how much to take   Trelegy Ellipta 100-62.5-25 MCG/ACT Aepb Generic drug: Fluticasone-Umeclidin-Vilant Inhale 1 puff into the lungs daily.   Voltaren 1 % Gel Generic drug: diclofenac Sodium Apply 2-4 g topically in the morning and at bedtime. Apply to bilateral knees/L shoulder topically twice a day for arthritis 4g dose lower extremities, 2g dose upper extremities        Discharge Instructions: Please refer to Patient Instructions section of EMR for full details.  Patient was counseled important signs and symptoms that should prompt return to medical care, changes in medications, dietary instructions, activity restrictions, and follow up appointments.   Follow-Up Appointments:   Gerrit Heck, DO 11/26/2023, 11:22 AM PGY-1, [facility] Family Medicine

## 2023-11-26 NOTE — TOC Initial Note (Signed)
 Transition of Care [facility]) - Initial/Assessment Note    Patient Details  Name: Chelsea [name] MRN: [id number] Date of Birth: Jul 13, 1966  Transition of Care [facility]) CM/SW Contact:    Eduard Roux, LCSW Phone Number: 11/26/2023, 10:16 AM  Clinical Narrative:                  CSW met with patient at bedside. CSW introduced self and explained role. Patient confirmed she reside at [facility] CSW informed of anticipated d/c today. She declined contact any family members to provide update.   TOC will continue to follow and assist with discharge planning.   Antony Blackbird, MSW, LCSW Clinical Social Worker    Expected Discharge Plan: Skilled Nursing Facility Barriers to Discharge: Barriers Resolved   Patient Goals and CMS Choice            Expected Discharge Plan and Services In-house Referral: Clinical Social Work     Living arrangements for the past 2 months: Skilled Nursing Facility                                      Prior Living Arrangements/Services Living arrangements for the past 2 months: Skilled Nursing Facility Lives with:: Facility Resident            Care giver support system in place?: Yes (comment)      Activities of Daily Living      Permission Sought/Granted                  Emotional Assessment     Affect (typically observed): Appropriate Orientation: : Oriented to Self, Oriented to Place, Oriented to  Time, Oriented to Situation Alcohol / Substance Use: Not Applicable Psych Involvement: No (comment)  Admission diagnosis:  COPD exacerbation (HCC) [J44.1] Acute on chronic respiratory failure with hypoxia and hypercapnia (HCC) [W09.81, J96.22] Acute on chronic hypoxic respiratory failure (HCC) [J96.21] Patient Active Problem List   Diagnosis Date Noted   Nocturnal hypoxemia due to obesity 11/23/2023   Atrial fibrillation with RVR (HCC) 11/09/2023   H1N1 influenza 11/07/2023   Acute on chronic hypoxic  respiratory failure (HCC) 11/04/2023   Abnormal CT scan of lung 09/22/2023   History of urinary retention 09/20/2023   Type 2 diabetes mellitus with diabetic neuropathy, unspecified (HCC) 08/16/2023   Anxiety 05/30/2023   Atrial flutter with rapid ventricular response (HCC) 05/29/2023   Arthritis 11/13/2022   Asthma 11/13/2022   Chronic lower back pain 11/13/2022   Complication of anesthesia 11/13/2022   Coronary artery disease 11/13/2022   (HFpEF) heart failure with preserved ejection fraction (HCC) 11/13/2022   Migraine 11/13/2022   On home oxygen therapy 11/13/2022   Chronic iron deficiency anemia 09/18/2022   Chronic disease anemia 08/01/2022   COPD (chronic obstructive pulmonary disease) with chronic bronchitis (HCC) 07/18/2022   Chronic respiratory failure with hypercapnia (HCC) 02/12/2022   Restrictive lung disease secondary to obesity 01/28/2022   Limitation of activity due to disability 09/18/2021   DJD (degenerative joint disease) 07/03/2021   Pressure injury of both heels, unstageable (HCC) 07/03/2021   Venous stasis dermatitis of both lower extremities 07/02/2021   Depression 04/20/2021   History of tobacco use 04/20/2021   Irritable bowel syndrome with constipation 04/20/2021   Microalbuminuria 04/20/2021   Moderate aortic stenosis 04/20/2021   Awaiting admission to adequate facility elsewhere 03/28/2021   Restless leg syndrome 03/22/2021   Atrial  flutter (HCC) 03/09/2021   Pulmonary nodule less than 6 cm determined by computed tomography of lung 02/09/2021   Nocturnal hypoxemia 06/03/2020   Hair loss 09/17/2019   Chronic pain of multiple joints 06/18/2019   Onychomycosis 06/18/2019   Hypertension associated with diabetes (HCC) 08/25/2018   Class 3 severe obesity due to excess calories with serious comorbidity and body mass index (BMI) of 60.0 to 69.9 in adult (HCC) 08/25/2018   Hepatitis C    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   OSA on  CPAP 02/24/2018   Hyperlipidemia associated with type 2 diabetes mellitus (HCC) 09/28/2015   Mononeuropathy of lower extremity 04/09/2011   Lymphedema 03/22/2011   Obesity hypoventilation syndrome (HCC) 01/01/2011   Acute on chronic diastolic heart failure (HCC) 11/28/2010   Osteoarthritis 11/28/2010   Hypothyroidism 11/13/2010   GERD 11/10/2010   PCP:  Pcp, No Pharmacy:   [facility], [place] - [address] [place] [address] Phone: [phone] Fax: [phone]  [facility] [address] [place] [address] Phone: [phone] Fax: [phone]     Social Drivers of Health (SDOH) Social History: SDOH Screenings   Food Insecurity: No Food Insecurity (11/23/2023)  Housing: Low Risk  (11/23/2023)  Transportation Needs: No Transportation Needs (11/23/2023)  Utilities: Not At Risk (11/23/2023)  Alcohol Screen: Low Risk  (07/05/2022)  Financial Resource Strain: Low Risk  (07/05/2022)  Physical Activity: Insufficiently Active (03/27/2021)   Received from [facility], [facility]  Social Connections: Unknown (02/04/2022)   Received from [facility], [facility]  Stress: No Stress Concern Present (09/15/2021)   Received from [facility], [facility]  Tobacco Use: Low Risk  (11/22/2023)   SDOH Interventions: Food Insecurity Interventions: Intervention Not Indicated Housing Interventions: Intervention Not Indicated Transportation Interventions: Intervention Not Indicated Utilities Interventions: Intervention Not Indicated   Readmission Risk Interventions    11/15/2023    4:20 PM 07/23/2022    2:19 PM 04/09/2022    4:17 PM  Readmission Risk Prevention Plan  Transportation Screening Complete Complete Complete  PCP or Specialist Appt within 3-5 Days   Complete  HRI or Home Care Consult   Complete  Social Work Consult for Recovery Care Planning/Counseling   Complete  Palliative Care  Screening   Not Applicable  Medication Review Oceanographer) Complete Complete Referral to Pharmacy  PCP or Specialist appointment within 3-5 days of discharge  Complete   HRI or Home Care Consult  Complete   SW Recovery Care/Counseling Consult  Complete   Palliative Care Screening Not Applicable Not Applicable   Skilled Nursing Facility Complete Complete

## 2023-11-26 NOTE — Assessment & Plan Note (Signed)
 Remains in normal sinus rhythm. -Continue Eliquis - Monitor heart rate closely, may need amiodarone and metoprolol IV -continuing home amiodarone and metoprolol

## 2023-11-26 NOTE — Assessment & Plan Note (Signed)
 Transition patient to 80 mg torsemide plus metolazone yesterday.  Chelsea [name] has had continued appropriate fluid output with an additional 1.3 L of diuresis accomplished yesterday.  Confirmed with her facility yesterday that Chelsea [name] was getting 60 mg twice daily of torsemide. -Continue torsemide 80 mg BID, will plan to discharge on this dose -Continue metolazone 2.5 mg three time per week, will continue on discharge - continue home Jardiance 25 mg  - Daily BMP and Mag, with diuresis  - Strict I/Os and daily weights

## 2023-11-26 NOTE — Inpatient Diabetes Management (Signed)
 Inpatient Diabetes Program Recommendations  AACE/ADA: New Consensus Statement on Inpatient Glycemic Control (2015)  Target Ranges:  Prepandial:   less than 140 mg/dL      Peak postprandial:   less than 180 mg/dL (1-2 hours)      Critically ill patients:  140 - 180 mg/dL   Lab Results  Component Value Date   GLUCAP 201 (H) 11/26/2023   HGBA1C 8.5 (H) 11/23/2023    Review of Glycemic Control  Diabetes history: DM 2 Outpatient Diabetes medications: Jardiance 25 mg Daily, Lantus 15 units Daily Current orders for Inpatient glycemic control:  Jardiance 25 mg Daily, Lantus 10 units Daily Novolog 0-20 units tid  A1c 8.5% on 2/22  Inpatient Diabetes Program Recommendations:    -   Add Novolog 3 units tid meal coverage if eating >50% of meals -   Add Carb modified to diet  Thanks,  Christena Deem RN, MSN, BC-ADM Inpatient Diabetes Coordinator Team Pager [phone] (8a-5p)

## 2023-11-26 NOTE — Assessment & Plan Note (Signed)
 Patient now tolerating home oxygen requirement during the day.  Appears to be at baseline, stable to discharge when authorized to return to ALF. -Continue BiPAP nightly -Home oxygen requirement during the day and while awake. - Continue diuresis as below - Consider ENT consult if patient considers tracheostomy

## 2023-11-26 NOTE — Assessment & Plan Note (Signed)
 Blood sugars stable in the mid 100s, continue plan as below - Lantus 10U daily + rSSI - Plan to switch from Trulicity to [facility] at discharge

## 2023-11-26 NOTE — TOC Transition Note (Signed)
 Transition of Care [facility]) - Discharge Note   Patient Details  Name: Chelsea [name] MRN: [id number] Date of Birth: 1966-08-08  Transition [facility] Care [facility]) CM/SW Contact:  Eduard Roux, LCSW Phone Number: 11/26/2023, 2:23 PM   Clinical Narrative:     Patient will Discharge to: [facility]  Discharge Date: 11/26/2023 Family Notified:pt declined  Transport [id number]  Per MD patient is ready for discharge. RN, patient, and facility notified of discharge. Discharge Summary sent to facility. RN given number for [phone]. Ambulance transport requested for patient.   Clinical Social Worker signing off.  Antony Blackbird, MSW, LCSW Clinical Social Worker     Final next level of care: Skilled Nursing Facility Barriers to Discharge: Barriers Resolved   Patient Goals and CMS Choice            Discharge Placement                       Discharge Plan and Services Additional resources added to the After Visit Summary for   In-house Referral: Clinical Social Work                                   Social Drivers of Health (SDOH) Interventions SDOH Screenings   Food Insecurity: No Food Insecurity (11/23/2023)  Housing: Low Risk  (11/23/2023)  Transportation Needs: No Transportation Needs (11/23/2023)  Utilities: Not At Risk (11/23/2023)  Alcohol Screen: Low Risk  (07/05/2022)  Financial Resource Strain: Low Risk  (07/05/2022)  Physical Activity: Insufficiently Active (03/27/2021)   Received from [facility], [facility]  Social Connections: Unknown (02/04/2022)   Received from [facility], [facility]  Stress: No Stress Concern Present (09/15/2021)   Received from [facility], [facility]  Tobacco Use: Low Risk  (11/22/2023)     Readmission Risk Interventions    11/15/2023    4:20 PM 07/23/2022    2:19 PM 04/09/2022    4:17 PM  Readmission Risk Prevention Plan  Transportation Screening Complete Complete Complete  PCP or  Specialist Appt within 3-5 Days   Complete  HRI or Home Care Consult   Complete  Social Work Consult for Recovery Care Planning/Counseling   Complete  Palliative Care Screening   Not Applicable  Medication Review Oceanographer) Complete Complete Referral to Pharmacy  PCP or Specialist appointment within 3-5 days of discharge  Complete   HRI or Home Care Consult  Complete   SW Recovery Care/Counseling Consult  Complete   Palliative Care Screening Not Applicable Not Applicable   Skilled Nursing Facility Complete Complete

## 2023-11-26 NOTE — Assessment & Plan Note (Signed)
 Improving with diuresis, wound care applied Unna boot yesterday. - Wound care consulted, appreciate assistance

## 2024-01-28 ENCOUNTER — Inpatient Hospital Stay (HOSPITAL_COMMUNITY)
Admission: EM | Admit: 2024-01-28 | Discharge: 2024-02-18 | DRG: 291 | Disposition: A | Discharge status: Other Acute Inpatient Hospital | Source: Ambulatory Visit | Attending: Internal Medicine | Admitting: Internal Medicine

## 2024-01-28 ENCOUNTER — Other Ambulatory Visit: Payer: Self-pay

## 2024-01-28 ENCOUNTER — Emergency Department (HOSPITAL_COMMUNITY)

## 2024-01-28 ENCOUNTER — Encounter (HOSPITAL_COMMUNITY): Payer: Self-pay

## 2024-01-28 DIAGNOSIS — T884XXA Failed or difficult intubation, initial encounter: Secondary | ICD-10-CM | POA: Diagnosis not present

## 2024-01-28 DIAGNOSIS — I480 Paroxysmal atrial fibrillation: Secondary | ICD-10-CM | POA: Diagnosis present

## 2024-01-28 DIAGNOSIS — I872 Venous insufficiency (chronic) (peripheral): Secondary | ICD-10-CM | POA: Diagnosis present

## 2024-01-28 DIAGNOSIS — I152 Hypertension secondary to endocrine disorders: Secondary | ICD-10-CM | POA: Diagnosis present

## 2024-01-28 DIAGNOSIS — E119 Type 2 diabetes mellitus without complications: Secondary | ICD-10-CM | POA: Diagnosis not present

## 2024-01-28 DIAGNOSIS — I5033 Acute on chronic diastolic (congestive) heart failure: Secondary | ICD-10-CM | POA: Diagnosis present

## 2024-01-28 DIAGNOSIS — I890 Lymphedema, not elsewhere classified: Secondary | ICD-10-CM | POA: Diagnosis not present

## 2024-01-28 DIAGNOSIS — D638 Anemia in other chronic diseases classified elsewhere: Secondary | ICD-10-CM | POA: Diagnosis present

## 2024-01-28 DIAGNOSIS — G4733 Obstructive sleep apnea (adult) (pediatric): Secondary | ICD-10-CM | POA: Diagnosis not present

## 2024-01-28 DIAGNOSIS — I350 Nonrheumatic aortic (valve) stenosis: Secondary | ICD-10-CM | POA: Diagnosis present

## 2024-01-28 DIAGNOSIS — I272 Pulmonary hypertension, unspecified: Secondary | ICD-10-CM | POA: Diagnosis present

## 2024-01-28 DIAGNOSIS — J440 Chronic obstructive pulmonary disease with acute lower respiratory infection: Secondary | ICD-10-CM | POA: Diagnosis present

## 2024-01-28 DIAGNOSIS — E039 Hypothyroidism, unspecified: Secondary | ICD-10-CM | POA: Diagnosis present

## 2024-01-28 DIAGNOSIS — Z79899 Other long term (current) drug therapy: Secondary | ICD-10-CM

## 2024-01-28 DIAGNOSIS — J9621 Acute and chronic respiratory failure with hypoxia: Secondary | ICD-10-CM | POA: Diagnosis present

## 2024-01-28 DIAGNOSIS — B1920 Unspecified viral hepatitis C without hepatic coma: Secondary | ICD-10-CM | POA: Diagnosis present

## 2024-01-28 DIAGNOSIS — Z8249 Family history of ischemic heart disease and other diseases of the circulatory system: Secondary | ICD-10-CM

## 2024-01-28 DIAGNOSIS — Z7401 Bed confinement status: Secondary | ICD-10-CM

## 2024-01-28 DIAGNOSIS — G2581 Restless legs syndrome: Secondary | ICD-10-CM | POA: Diagnosis present

## 2024-01-28 DIAGNOSIS — Z8541 Personal history of malignant neoplasm of cervix uteri: Secondary | ICD-10-CM

## 2024-01-28 DIAGNOSIS — J9622 Acute and chronic respiratory failure with hypercapnia: Secondary | ICD-10-CM | POA: Diagnosis present

## 2024-01-28 DIAGNOSIS — Z1152 Encounter for screening for COVID-19: Secondary | ICD-10-CM | POA: Diagnosis not present

## 2024-01-28 DIAGNOSIS — E1159 Type 2 diabetes mellitus with other circulatory complications: Secondary | ICD-10-CM | POA: Diagnosis present

## 2024-01-28 DIAGNOSIS — I110 Hypertensive heart disease with heart failure: Secondary | ICD-10-CM | POA: Diagnosis present

## 2024-01-28 DIAGNOSIS — J9811 Atelectasis: Secondary | ICD-10-CM | POA: Diagnosis present

## 2024-01-28 DIAGNOSIS — N179 Acute kidney failure, unspecified: Secondary | ICD-10-CM | POA: Diagnosis present

## 2024-01-28 DIAGNOSIS — F419 Anxiety disorder, unspecified: Secondary | ICD-10-CM | POA: Diagnosis not present

## 2024-01-28 DIAGNOSIS — I4891 Unspecified atrial fibrillation: Secondary | ICD-10-CM | POA: Diagnosis not present

## 2024-01-28 DIAGNOSIS — Z91041 Radiographic dye allergy status: Secondary | ICD-10-CM

## 2024-01-28 DIAGNOSIS — Z8701 Personal history of pneumonia (recurrent): Secondary | ICD-10-CM

## 2024-01-28 DIAGNOSIS — Z7951 Long term (current) use of inhaled steroids: Secondary | ICD-10-CM

## 2024-01-28 DIAGNOSIS — Z66 Do not resuscitate: Secondary | ICD-10-CM | POA: Diagnosis not present

## 2024-01-28 DIAGNOSIS — M549 Dorsalgia, unspecified: Secondary | ICD-10-CM | POA: Diagnosis present

## 2024-01-28 DIAGNOSIS — H5789 Other specified disorders of eye and adnexa: Secondary | ICD-10-CM | POA: Diagnosis not present

## 2024-01-28 DIAGNOSIS — E782 Mixed hyperlipidemia: Secondary | ICD-10-CM | POA: Diagnosis present

## 2024-01-28 DIAGNOSIS — E662 Morbid (severe) obesity with alveolar hypoventilation: Secondary | ICD-10-CM | POA: Diagnosis present

## 2024-01-28 DIAGNOSIS — R079 Chest pain, unspecified: Secondary | ICD-10-CM | POA: Diagnosis not present

## 2024-01-28 DIAGNOSIS — Z888 Allergy status to other drugs, medicaments and biological substances status: Secondary | ICD-10-CM

## 2024-01-28 DIAGNOSIS — I5031 Acute diastolic (congestive) heart failure: Secondary | ICD-10-CM | POA: Diagnosis not present

## 2024-01-28 DIAGNOSIS — Z87891 Personal history of nicotine dependence: Secondary | ICD-10-CM

## 2024-01-28 DIAGNOSIS — E66813 Obesity, class 3: Secondary | ICD-10-CM

## 2024-01-28 DIAGNOSIS — Z7985 Long-term (current) use of injectable non-insulin antidiabetic drugs: Secondary | ICD-10-CM

## 2024-01-28 DIAGNOSIS — D509 Iron deficiency anemia, unspecified: Secondary | ICD-10-CM | POA: Diagnosis present

## 2024-01-28 DIAGNOSIS — Z9981 Dependence on supplemental oxygen: Secondary | ICD-10-CM

## 2024-01-28 DIAGNOSIS — E1169 Type 2 diabetes mellitus with other specified complication: Secondary | ICD-10-CM | POA: Diagnosis present

## 2024-01-28 DIAGNOSIS — E876 Hypokalemia: Secondary | ICD-10-CM | POA: Diagnosis not present

## 2024-01-28 DIAGNOSIS — E1165 Type 2 diabetes mellitus with hyperglycemia: Secondary | ICD-10-CM | POA: Diagnosis present

## 2024-01-28 DIAGNOSIS — J690 Pneumonitis due to inhalation of food and vomit: Secondary | ICD-10-CM | POA: Diagnosis present

## 2024-01-28 DIAGNOSIS — E785 Hyperlipidemia, unspecified: Secondary | ICD-10-CM | POA: Diagnosis not present

## 2024-01-28 DIAGNOSIS — Z7189 Other specified counseling: Secondary | ICD-10-CM | POA: Diagnosis not present

## 2024-01-28 DIAGNOSIS — Z7901 Long term (current) use of anticoagulants: Secondary | ICD-10-CM

## 2024-01-28 DIAGNOSIS — J449 Chronic obstructive pulmonary disease, unspecified: Secondary | ICD-10-CM | POA: Diagnosis not present

## 2024-01-28 DIAGNOSIS — K219 Gastro-esophageal reflux disease without esophagitis: Secondary | ICD-10-CM | POA: Diagnosis present

## 2024-01-28 DIAGNOSIS — Z794 Long term (current) use of insulin: Secondary | ICD-10-CM

## 2024-01-28 DIAGNOSIS — Z87892 Personal history of anaphylaxis: Secondary | ICD-10-CM

## 2024-01-28 DIAGNOSIS — J441 Chronic obstructive pulmonary disease with (acute) exacerbation: Secondary | ICD-10-CM | POA: Diagnosis not present

## 2024-01-28 DIAGNOSIS — E1141 Type 2 diabetes mellitus with diabetic mononeuropathy: Secondary | ICD-10-CM | POA: Diagnosis present

## 2024-01-28 DIAGNOSIS — G8929 Other chronic pain: Secondary | ICD-10-CM | POA: Diagnosis present

## 2024-01-28 DIAGNOSIS — Z993 Dependence on wheelchair: Secondary | ICD-10-CM

## 2024-01-28 DIAGNOSIS — I4892 Unspecified atrial flutter: Secondary | ICD-10-CM | POA: Diagnosis present

## 2024-01-28 DIAGNOSIS — J189 Pneumonia, unspecified organism: Secondary | ICD-10-CM

## 2024-01-28 DIAGNOSIS — Z880 Allergy status to penicillin: Secondary | ICD-10-CM

## 2024-01-28 DIAGNOSIS — Z7984 Long term (current) use of oral hypoglycemic drugs: Secondary | ICD-10-CM

## 2024-01-28 DIAGNOSIS — I2510 Atherosclerotic heart disease of native coronary artery without angina pectoris: Secondary | ICD-10-CM | POA: Diagnosis present

## 2024-01-28 DIAGNOSIS — I10 Essential (primary) hypertension: Secondary | ICD-10-CM | POA: Diagnosis not present

## 2024-01-28 DIAGNOSIS — Z6841 Body Mass Index (BMI) 40.0 and over, adult: Secondary | ICD-10-CM | POA: Diagnosis not present

## 2024-01-28 DIAGNOSIS — I358 Other nonrheumatic aortic valve disorders: Secondary | ICD-10-CM | POA: Diagnosis present

## 2024-01-28 DIAGNOSIS — Z515 Encounter for palliative care: Secondary | ICD-10-CM | POA: Diagnosis not present

## 2024-01-28 DIAGNOSIS — Z79890 Hormone replacement therapy (postmenopausal): Secondary | ICD-10-CM

## 2024-01-28 DIAGNOSIS — Z90710 Acquired absence of both cervix and uterus: Secondary | ICD-10-CM

## 2024-01-28 LAB — CBC WITH DIFFERENTIAL/PLATELET
Abs Immature Granulocytes: 0.04 10*3/uL (ref 0.00–0.07)
Basophils Absolute: 0.1 10*3/uL (ref 0.0–0.1)
Basophils Relative: 1 %
Eosinophils Absolute: 0.1 10*3/uL (ref 0.0–0.5)
Eosinophils Relative: 2 %
HCT: 34.1 % — ABNORMAL LOW (ref 36.0–46.0)
Hemoglobin: 10.3 g/dL — ABNORMAL LOW (ref 12.0–15.0)
Immature Granulocytes: 1 %
Lymphocytes Relative: 18 %
Lymphs Abs: 1.5 10*3/uL (ref 0.7–4.0)
MCH: 27.2 pg (ref 26.0–34.0)
MCHC: 30.2 g/dL (ref 30.0–36.0)
MCV: 90 fL (ref 80.0–100.0)
Monocytes Absolute: 0.9 10*3/uL (ref 0.1–1.0)
Monocytes Relative: 11 %
Neutro Abs: 5.7 10*3/uL (ref 1.7–7.7)
Neutrophils Relative %: 67 %
Platelets: 278 10*3/uL (ref 150–400)
RBC: 3.79 MIL/uL — ABNORMAL LOW (ref 3.87–5.11)
RDW: 15.2 % (ref 11.5–15.5)
WBC: 8.3 10*3/uL (ref 4.0–10.5)
nRBC: 0 % (ref 0.0–0.2)

## 2024-01-28 LAB — HEMOGLOBIN A1C
Hgb A1c MFr Bld: 8.1 % — ABNORMAL HIGH (ref 4.8–5.6)
Mean Plasma Glucose: 185.77 mg/dL

## 2024-01-28 LAB — FERRITIN: Ferritin: 41 ng/mL (ref 11–307)

## 2024-01-28 LAB — LACTIC ACID, PLASMA: Lactic Acid, Venous: 1 mmol/L (ref 0.5–1.9)

## 2024-01-28 LAB — COMPREHENSIVE METABOLIC PANEL WITH GFR
ALT: 13 U/L (ref 0–44)
AST: 12 U/L — ABNORMAL LOW (ref 15–41)
Albumin: 2.8 g/dL — ABNORMAL LOW (ref 3.5–5.0)
Alkaline Phosphatase: 95 U/L (ref 38–126)
Anion gap: 14 (ref 5–15)
BUN: 40 mg/dL — ABNORMAL HIGH (ref 6–20)
CO2: 40 mmol/L — ABNORMAL HIGH (ref 22–32)
Calcium: 8.6 mg/dL — ABNORMAL LOW (ref 8.9–10.3)
Chloride: 83 mmol/L — ABNORMAL LOW (ref 98–111)
Creatinine, Ser: 1.12 mg/dL — ABNORMAL HIGH (ref 0.44–1.00)
GFR, Estimated: 56 mL/min — ABNORMAL LOW (ref >60)
Glucose, Bld: 232 mg/dL — ABNORMAL HIGH (ref 70–99)
Potassium: 3.2 mmol/L — ABNORMAL LOW (ref 3.5–5.1)
Sodium: 137 mmol/L (ref 135–145)
Total Bilirubin: 0.7 mg/dL (ref 0.0–1.2)
Total Protein: 7.4 g/dL (ref 6.5–8.1)

## 2024-01-28 LAB — RESP PANEL BY RT-PCR (RSV, FLU A&B, COVID)  RVPGX2
Influenza A by PCR: NEGATIVE
Influenza B by PCR: NEGATIVE
Resp Syncytial Virus by PCR: NEGATIVE
SARS Coronavirus 2 by RT PCR: NEGATIVE

## 2024-01-28 LAB — I-STAT VENOUS BLOOD GAS, ED
Acid-Base Excess: 19 mmol/L — ABNORMAL HIGH (ref 0.0–2.0)
Bicarbonate: 46.7 mmol/L — ABNORMAL HIGH (ref 20.0–28.0)
Calcium, Ion: 1 mmol/L — ABNORMAL LOW (ref 1.15–1.40)
HCT: 33 % — ABNORMAL LOW (ref 36.0–46.0)
Hemoglobin: 11.2 g/dL — ABNORMAL LOW (ref 12.0–15.0)
O2 Saturation: 79 %
Potassium: 3.2 mmol/L — ABNORMAL LOW (ref 3.5–5.1)
Sodium: 138 mmol/L (ref 135–145)
TCO2: 49 mmol/L — ABNORMAL HIGH (ref 22–32)
pCO2, Ven: 73.3 mmHg (ref 44–60)
pH, Ven: 7.412 (ref 7.25–7.43)
pO2, Ven: 46 mmHg — ABNORMAL HIGH (ref 32–45)

## 2024-01-28 LAB — TSH: TSH: 1.394 u[IU]/mL (ref 0.350–4.500)

## 2024-01-28 LAB — CBG MONITORING, ED
Glucose-Capillary: 171 mg/dL — ABNORMAL HIGH (ref 70–99)
Glucose-Capillary: 153 mg/dL — ABNORMAL HIGH (ref 70–99)

## 2024-01-28 LAB — TROPONIN I (HIGH SENSITIVITY)
Troponin I (High Sensitivity): 6 ng/L
Troponin I (High Sensitivity): 7 ng/L (ref <18)

## 2024-01-28 LAB — RETICULOCYTES
Immature Retic Fract: 30.5 % — ABNORMAL HIGH (ref 2.3–15.9)
RBC.: 3.75 MIL/uL — ABNORMAL LOW (ref 3.87–5.11)
Retic Count, Absolute: 103.1 10*3/uL (ref 19.0–186.0)
Retic Ct Pct: 2.8 % (ref 0.4–3.1)

## 2024-01-28 LAB — IRON AND TIBC
Iron: 24 ug/dL — ABNORMAL LOW (ref 28–170)
Saturation Ratios: 6 % — ABNORMAL LOW (ref 10.4–31.8)
TIBC: 414 ug/dL (ref 250–450)
UIBC: 390 ug/dL

## 2024-01-28 LAB — PROCALCITONIN: Procalcitonin: 0.1 ng/mL

## 2024-01-28 LAB — BRAIN NATRIURETIC PEPTIDE: B Natriuretic Peptide: 284.4 pg/mL — ABNORMAL HIGH (ref 0.0–100.0)

## 2024-01-28 LAB — CK: Total CK: 58 U/L (ref 38–234)

## 2024-01-28 LAB — AMMONIA: Ammonia: 19 umol/L (ref 9–35)

## 2024-01-28 LAB — PROTIME-INR
INR: 1.5 — ABNORMAL HIGH (ref 0.8–1.2)
Prothrombin Time: 18.7 s — ABNORMAL HIGH (ref 11.4–15.2)

## 2024-01-28 LAB — PHOSPHORUS: Phosphorus: 5.8 mg/dL — ABNORMAL HIGH (ref 2.5–4.6)

## 2024-01-28 LAB — MAGNESIUM: Magnesium: 2.1 mg/dL (ref 1.7–2.4)

## 2024-01-28 MED ORDER — FENTANYL CITRATE PF 50 MCG/ML IJ SOSY
25.0000 ug | PREFILLED_SYRINGE | Freq: Once | INTRAMUSCULAR | Status: AC
  Administered 2024-01-28: 25 ug via INTRAVENOUS
  Filled 2024-01-28: qty 1

## 2024-01-28 MED ORDER — DOCUSATE SODIUM 100 MG PO CAPS
100.0000 mg | ORAL_CAPSULE | Freq: Two times a day (BID) | ORAL | Status: DC
  Administered 2024-02-02 – 2024-02-04 (×3): 100 mg via ORAL
  Filled 2024-01-28 (×14): qty 1

## 2024-01-28 MED ORDER — ACETAMINOPHEN 325 MG PO TABS
650.0000 mg | ORAL_TABLET | Freq: Four times a day (QID) | ORAL | Status: DC | PRN
  Administered 2024-02-05 – 2024-02-06 (×3): 650 mg via ORAL
  Filled 2024-01-28 (×3): qty 2

## 2024-01-28 MED ORDER — SODIUM CHLORIDE 0.9% FLUSH
3.0000 mL | Freq: Two times a day (BID) | INTRAVENOUS | Status: DC
Start: 2024-01-28 — End: 2024-02-19
  Administered 2024-01-28 – 2024-02-18 (×35): 3 mL via INTRAVENOUS

## 2024-01-28 MED ORDER — SODIUM CHLORIDE 0.9 % IV SOLN
500.0000 mg | Freq: Once | INTRAVENOUS | Status: AC
  Administered 2024-01-28: 500 mg via INTRAVENOUS
  Filled 2024-01-28: qty 5

## 2024-01-28 MED ORDER — SENNA 8.6 MG PO TABS
1.0000 | ORAL_TABLET | Freq: Two times a day (BID) | ORAL | Status: DC
  Administered 2024-01-31 – 2024-02-18 (×12): 8.6 mg via ORAL
  Filled 2024-01-28 (×32): qty 1

## 2024-01-28 MED ORDER — ALBUTEROL SULFATE (2.5 MG/3ML) 0.083% IN NEBU
2.5000 mg | INHALATION_SOLUTION | RESPIRATORY_TRACT | Status: DC | PRN
  Administered 2024-02-02: 2.5 mg via RESPIRATORY_TRACT
  Filled 2024-01-28: qty 3

## 2024-01-28 MED ORDER — GUAIFENESIN ER 600 MG PO TB12
600.0000 mg | ORAL_TABLET | Freq: Two times a day (BID) | ORAL | Status: DC
  Administered 2024-01-28 – 2024-02-14 (×33): 600 mg via ORAL
  Filled 2024-01-28 (×33): qty 1

## 2024-01-28 MED ORDER — AMIODARONE HCL 200 MG PO TABS
200.0000 mg | ORAL_TABLET | Freq: Every day | ORAL | Status: DC
  Administered 2024-01-29 – 2024-02-18 (×21): 200 mg via ORAL
  Filled 2024-01-28 (×21): qty 1

## 2024-01-28 MED ORDER — SODIUM CHLORIDE 0.9 % IV SOLN: 250.0000 mL | INTRAVENOUS | Status: AC | PRN

## 2024-01-28 MED ORDER — INSULIN ASPART 100 UNIT/ML IJ SOLN
0.0000 [IU] | INTRAMUSCULAR | Status: DC
  Administered 2024-01-28 (×2): 2 [IU] via SUBCUTANEOUS
  Administered 2024-01-29: 3 [IU] via SUBCUTANEOUS
  Administered 2024-01-29: 1 [IU] via SUBCUTANEOUS
  Administered 2024-01-29: 2 [IU] via SUBCUTANEOUS
  Administered 2024-01-29 (×2): 1 [IU] via SUBCUTANEOUS
  Administered 2024-01-30: 2 [IU] via SUBCUTANEOUS
  Administered 2024-01-30: 3 [IU] via SUBCUTANEOUS
  Administered 2024-01-30: 1 [IU] via SUBCUTANEOUS
  Administered 2024-01-30: 3 [IU] via SUBCUTANEOUS
  Administered 2024-01-30: 1 [IU] via SUBCUTANEOUS

## 2024-01-28 MED ORDER — BUPROPION HCL ER (SR) 100 MG PO TB12
100.0000 mg | ORAL_TABLET | Freq: Two times a day (BID) | ORAL | Status: DC
  Administered 2024-01-28 – 2024-02-18 (×40): 100 mg via ORAL
  Filled 2024-01-28 (×45): qty 1

## 2024-01-28 MED ORDER — INSULIN GLARGINE-YFGN 100 UNIT/ML ~~LOC~~ SOLN: 10.0000 [IU] | Freq: Every day | SUBCUTANEOUS | Status: DC

## 2024-01-28 MED ORDER — HYDROCODONE-ACETAMINOPHEN 5-325 MG PO TABS
1.0000 | ORAL_TABLET | ORAL | Status: DC | PRN
  Administered 2024-01-29: 1 via ORAL
  Filled 2024-01-28: qty 1

## 2024-01-28 MED ORDER — METOPROLOL SUCCINATE ER 50 MG PO TB24
50.0000 mg | ORAL_TABLET | Freq: Every morning | ORAL | Status: DC
  Administered 2024-01-29 – 2024-01-31 (×3): 50 mg via ORAL
  Filled 2024-01-28 (×4): qty 1

## 2024-01-28 MED ORDER — SODIUM CHLORIDE 0.9 % IV SOLN
1.0000 g | Freq: Once | INTRAVENOUS | Status: AC
  Administered 2024-01-28: 1 g via INTRAVENOUS
  Filled 2024-01-28: qty 10

## 2024-01-28 MED ORDER — FENTANYL CITRATE PF 50 MCG/ML IJ SOSY
25.0000 ug | PREFILLED_SYRINGE | INTRAMUSCULAR | Status: DC | PRN
  Administered 2024-01-28 – 2024-01-29 (×2): 25 ug via INTRAVENOUS
  Filled 2024-01-28 (×2): qty 1

## 2024-01-28 MED ORDER — POLYETHYLENE GLYCOL 3350 17 G PO PACK: 17.0000 g | PACK | Freq: Every day | ORAL | Status: DC | PRN

## 2024-01-28 MED ORDER — SODIUM CHLORIDE 0.9% FLUSH: 3.0000 mL | INTRAVENOUS | Status: DC | PRN

## 2024-01-28 MED ORDER — LEVOTHYROXINE SODIUM 75 MCG PO TABS
150.0000 ug | ORAL_TABLET | Freq: Every morning | ORAL | Status: DC
  Administered 2024-01-29 – 2024-02-18 (×20): 150 ug via ORAL
  Filled 2024-01-28 (×21): qty 2

## 2024-01-28 MED ORDER — ONDANSETRON HCL 4 MG/2ML IJ SOLN: 4.0000 mg | Freq: Four times a day (QID) | INTRAMUSCULAR | Status: DC | PRN

## 2024-01-28 MED ORDER — ONDANSETRON HCL 4 MG PO TABS: 4.0000 mg | ORAL_TABLET | Freq: Four times a day (QID) | ORAL | Status: DC | PRN

## 2024-01-28 MED ORDER — ACETAMINOPHEN 650 MG RE SUPP: 650.0000 mg | Freq: Four times a day (QID) | RECTAL | Status: DC | PRN

## 2024-01-28 MED ORDER — POTASSIUM CHLORIDE CRYS ER 20 MEQ PO TBCR
40.0000 meq | EXTENDED_RELEASE_TABLET | Freq: Once | ORAL | Status: AC
  Administered 2024-01-28: 40 meq via ORAL
  Filled 2024-01-28: qty 2

## 2024-01-28 MED ORDER — FUROSEMIDE 10 MG/ML IJ SOLN
40.0000 mg | Freq: Two times a day (BID) | INTRAMUSCULAR | Status: DC
  Administered 2024-01-29: 40 mg via INTRAVENOUS
  Filled 2024-01-28: qty 4

## 2024-01-28 MED ORDER — APIXABAN 5 MG PO TABS
5.0000 mg | ORAL_TABLET | Freq: Two times a day (BID) | ORAL | Status: DC
Start: 2024-01-28 — End: 2024-01-29

## 2024-01-28 MED ORDER — ATORVASTATIN CALCIUM 10 MG PO TABS
20.0000 mg | ORAL_TABLET | Freq: Every evening | ORAL | Status: DC
  Administered 2024-01-28 – 2024-02-18 (×20): 20 mg via ORAL
  Filled 2024-01-28: qty 2
  Filled 2024-01-28: qty 1
  Filled 2024-01-28 (×19): qty 2

## 2024-01-28 MED ORDER — LORAZEPAM 2 MG/ML IJ SOLN
0.5000 mg | Freq: Once | INTRAMUSCULAR | Status: AC
  Administered 2024-01-28: 0.5 mg via INTRAVENOUS
  Filled 2024-01-28: qty 1

## 2024-01-28 MED ORDER — FUROSEMIDE 10 MG/ML IJ SOLN
40.0000 mg | Freq: Once | INTRAMUSCULAR | Status: AC
  Administered 2024-01-28: 40 mg via INTRAVENOUS
  Filled 2024-01-28: qty 4

## 2024-01-28 NOTE — H&P (Signed)
 Chelsea [name]:[id number] DOB: [birth year] DOA: 01/28/2024     PCP: Pcp, No  pt has not been able to follow up with any PCP as an outpt, well known to family medicine service Outpatient Specialists:   CARDS:  Dr. Dorothye Gathers, MD     Patient coming from:    From facility      Chief Complaint:    Chief Complaint  Patient presents with   Shortness of Breath    HPI: Chelsea [name] is a 58 y.o. female with medical history significant of DM2, morbid obesity, obesity hypoventilation syndrome, heart failure, A-fib, chronic respiratory failure with hypercapnia, hypertension hepatitis C, COPD    Presented with increased shortness of breath Shortness of breath with past 4 days She has been coming off of her Xanax  recently and feels that the symptoms are secondary to that.  Patient is on chronic oxygen .  CBG noted to 347  Patient with known history of CHF hypertension hepatitis C At baseline on 5 L FiO2 Patient denies any chills or fevers no wheezing reports this feels more like heart failure With bilateral leg pain patient is alert and oriented at this time    Denies significant ETOH intake   Does not smoke       Regarding pertinent Chronic problems:    Hyperlipidemia -  on statins Lipitor (atorvastatin )  Lipid Panel     Component Value Date/Time   CHOL 144 05/31/2023 1025   TRIG 197 (H) 05/31/2023 1025   HDL 29 (L) 05/31/2023 1025   CHOLHDL 5.0 05/31/2023 1025   VLDL 39 05/31/2023 1025   LDLCALC 76 05/31/2023 1025     HTN on Toprol    chronic CHF diastolic/  - last echo  Recent Results (from the past [id number] hours)  ECHOCARDIOGRAM COMPLETE   Collection Time: 11/11/23  1:19 PM  Result Value   Weight 5,120   Height 65   BP 108/69   S' Lateral 3.20   AR max vel 0.87   AV Area VTI 0.94   AV Mean grad 20.7   AV Peak grad 31.7   Ao pk vel 2.82   Area-P 1/2 3.89   AV Area mean vel 0.80   Est EF 60 - 65%   Narrative      ECHOCARDIOGRAM REPORT          1. Technically difficult; Calcified aortic valve with likely moderate AS (mean gradient 21 mmHg; AVA calculated at 0.9 cm2; DI 0.30).  2. Left ventricular ejection fraction, by estimation, is 60 to 65%. The left ventricle has normal function. The left ventricle has no regional wall motion abnormalities. There is mild left ventricular hypertrophy. Left ventricular diastolic parameters  are consistent with Grade II diastolic dysfunction (pseudonormalization). Elevated left atrial pressure.  3. Right ventricular systolic function is normal. The right ventricular size is normal.  4. Left atrial size was moderately dilated.  5. The mitral valve is normal in structure. No evidence of mitral valve regurgitation. No evidence of mitral stenosis.  6. The aortic valve is calcified. Aortic valve regurgitation is mild. Moderate aortic valve stenosis.  7. The inferior vena cava is normal in size with greater than 50% respiratory variability, suggesting right atrial pressure of 3 mmHg.           CAD  - On Aspirin , statin, betablocker, Plavix                 - *followed by cardiology                -  last cardiac cath        DM 2 -  Lab Results  Component Value Date   HGBA1C 8.5 (H) 11/23/2023   on insulin ,     Hypothyroidism:   Lab Results  Component Value Date   TSH 1.562 09/29/2023   on synthroid     Morbid obesity-   BMI Readings from Last 1 Encounters:  01/28/24 53.38 kg/m        COPD -    on baseline oxygen   5L, and BiPAP 10/5    OSA -on nocturnal BiPAP     A. Fib -   atrial fibrillation CHA2DS2 vas score   5   current  on anticoagulation with  Eliquis ,           -  Rate control:  Currently controlled with  Toprolol,           - Rhythm control:   amiodarone          Chronic anemia - baseline hg Hemoglobin & Hematocrit  Recent Labs    11/22/23 1805 11/23/23 0305 01/28/24 1623  HGB 12.6 11.2* 10.3*   Iron /TIBC/Ferritin/ %Sat    Component Value Date/Time   IRON  13 (L)  05/30/2023 0340   TIBC 392 05/30/2023 0340   FERRITIN 29 05/30/2023 0340   IRONPCTSAT 3 (L) 05/30/2023 0340      While in ER:     Was given 40 mg of lasix   Rocephin  and azithro Placed on BiPAP 50 FiO2    Lab Orders         CBC with Differential         Brain natriuretic peptide         Comprehensive metabolic panel with GFR      CXR -bilateral edema    Following Medications were ordered in ER: Medications  furosemide  (LASIX ) injection 40 mg (40 mg Intravenous Given 01/28/24 1812)  cefTRIAXone  (ROCEPHIN ) 1 g in sodium chloride  0.9 % 100 mL IVPB (0 g Intravenous Stopped 01/28/24 1847)  azithromycin  (ZITHROMAX ) 500 mg in sodium chloride  0.9 % 250 mL IVPB (500 mg Intravenous New Bag/Given 01/28/24 1847)  fentaNYL  (SUBLIMAZE ) injection 25 mcg (25 mcg Intravenous Given 01/28/24 1820)  LORazepam  (ATIVAN ) injection 0.5 mg (0.5 mg Intravenous Given 01/28/24 1821)        ED Triage Vitals  Encounter Vitals Group     BP 01/28/24 1553 (!) 123/57     Systolic BP Percentile --      Diastolic BP Percentile --      Pulse Rate 01/28/24 1553 73     Resp 01/28/24 1601 18     Temp 01/28/24 1601 (!) 97.5 F (36.4 C)     Temp Source 01/28/24 1601 Oral     SpO2 01/28/24 1601 (!) 85 %     Weight 01/28/24 1553 (!) 311 lb (141.1 kg)     Height 01/28/24 1553 5\' 4"  (1.626 m)     Head Circumference --      Peak Flow --      Pain Score 01/28/24 1601 6     Pain Loc --      Pain Education --      Exclude from Growth Chart --   ZOXW(96)@     _________________________________________ Significant initial  Findings: Abnormal Labs Reviewed  CBC WITH DIFFERENTIAL/PLATELET - Abnormal; Notable for the following components:      Result Value   RBC 3.79 (*)    Hemoglobin 10.3 (*)    HCT 34.1 (*)  All other components within normal limits  BRAIN NATRIURETIC PEPTIDE - Abnormal; Notable for the following components:   B Natriuretic Peptide 284.4 (*)    All other components within normal limits   COMPREHENSIVE METABOLIC PANEL WITH GFR - Abnormal; Notable for the following components:   Potassium 3.2 (*)    Chloride 83 (*)    CO2 40 (*)    Glucose, Bld 232 (*)    BUN 40 (*)    Creatinine, Ser 1.12 (*)    Calcium  8.6 (*)    Albumin  2.8 (*)    AST 12 (*)    GFR, Estimated 56 (*)    All other components within normal limits    _________________________ Troponin  ordered Cardiac Panel (last 3 results) Recent Labs    01/28/24 1623 01/28/24 1729  TROPONINIHS 6 7     ECG: Ordered Personally reviewed and interpreted by me showing: HR : 70  Rhythm:Sinus rhythm Borderline repolarization abnormality QTC 474  BNP (last 3 results) Recent Labs    11/10/23 0540 11/22/23 1607 01/28/24 1623  BNP 111.6* 348.6* 284.4*      The recent clinical data is shown below. Vitals:   01/28/24 1601 01/28/24 1640 01/28/24 1850 01/28/24 1915  BP: (!) 108/59  101/66 116/77  Pulse: 78  79 80  Resp: 18  (!) 25 19  Temp: (!) 97.5 F (36.4 C)  97.9 F (36.6 C)   TempSrc: Oral  Axillary   SpO2: (!) 85% 96% 98% 94%  Weight:      Height:        WBC     Component Value Date/Time   WBC 8.3 01/28/2024 1623   LYMPHSABS 1.5 01/28/2024 1623   MONOABS 0.9 01/28/2024 1623   EOSABS 0.1 01/28/2024 1623   BASOSABS 0.1 01/28/2024 1623    Lactic Acid , Venous    Component Value Date/Time   LATICACIDVEN 1.0 01/28/2024 2059    Procalcitonin   0.1      UA   ordered     Results for orders placed or performed during the hospital encounter of 01/28/24  Resp panel by RT-PCR (RSV, Flu A&B, Covid) Anterior Nasal Swab     Status: None   Collection Time: 01/28/24  8:29 PM   Specimen: Anterior Nasal Swab  Result Value Ref Range Status   SARS Coronavirus 2 by RT PCR NEGATIVE NEGATIVE Final   Influenza A by PCR NEGATIVE NEGATIVE Final   Influenza B by PCR NEGATIVE NEGATIVE Final         Resp Syncytial Virus by PCR NEGATIVE NEGATIVE Final          ABX started Antibiotics Given (last 72  hours)     Date/Time Action Medication Dose Rate   01/28/24 1811 New Bag/Given   cefTRIAXone  (ROCEPHIN ) 1 g in sodium chloride  0.9 % 100 mL IVPB 1 g 200 mL/hr   01/28/24 1847 New Bag/Given   azithromycin  (ZITHROMAX ) 500 mg in sodium chloride  0.9 % 250 mL IVPB 500 mg 250 mL/hr      _________________________________________________________   Venous  Blood Gas result:  pH   7.412 Sodium 138 mmol/L   pCO2, Ven 73.3 High Panic  mmHg Potassium 3.2 Low  mmol/L  pO2, Ven 46 High  mmHg      ABG    Component Value Date/Time   PHART 7.349 (L) 08/15/2023 1402   PCO2ART 96.6 (HH) 08/15/2023 1402   PO2ART 54 (L) 08/15/2023 1402   HCO3 48.7 (H) 11/24/2023 2001  TCO2 44 (H) 11/22/2023 1805   ACIDBASEDEF 7.9 (H) 09/20/2023 2156   O2SAT 63.1 11/24/2023 2001      __________________________________________________________ Recent Labs  Lab 01/28/24 1729 01/28/24 2058 01/28/24 2059  NA 137 138  --   K 3.2* 3.2*  --   CO2 40*  --   --   GLUCOSE 232*  --   --   BUN 40*  --   --   CREATININE 1.12*  --   --   CALCIUM  8.6*  --   --   MG  --   --  2.1  PHOS  --   --  5.8*    Cr   Up from baseline see below Lab Results  Component Value Date   CREATININE 1.12 (H) 01/28/2024   CREATININE 1.12 (H) 11/26/2023   CREATININE 0.86 11/25/2023    Recent Labs  Lab 01/28/24 1729  AST 12*  ALT 13  ALKPHOS 95  BILITOT 0.7  PROT 7.4  ALBUMIN  2.8*   Lab Results  Component Value Date   CALCIUM  8.6 (L) 01/28/2024   PHOS 4.5 10/04/2023    Plt: Lab Results  Component Value Date   PLT 278 01/28/2024       Recent Labs  Lab 01/28/24 1623  WBC 8.3  NEUTROABS 5.7  HGB 10.3*  HCT 34.1*  MCV 90.0  PLT 278    HG/HCT stable,       Component Value Date/Time   HGB 10.3 (L) 01/28/2024 1623   HCT 34.1 (L) 01/28/2024 1623   MCV 90.0 01/28/2024 1623    _______________________________________________ Hospitalist was called for admission for   Acute on chronic hypoxic and hypercarbic  respiratory failure , acute on chronic diastolic CHF exacerbation    The following Work up has been ordered so far:  Orders Placed This Encounter  Procedures   DG Chest Portable 1 View   CBC with Differential   Brain natriuretic peptide   Comprehensive metabolic panel with GFR   Consult to family practice   Consult for [facility] Medical Admission   Bipap   ED EKG   EKG 12-Lead     OTHER Significant initial  Findings:  labs showing:     DM  labs:  HbA1C: Recent Labs    05/29/23 1143 08/16/23 0419 11/23/23 0305  HGBA1C 8.5* 9.3* 8.5*      CBG (last 3)  No results for input(s): "GLUCAP" in the last 72 hours.        Cultures:    Component Value Date/Time   SDES BLOOD BLOOD RIGHT HAND 11/07/2023 0452   SPECREQUEST  11/07/2023 0452    BOTTLES DRAWN AEROBIC AND ANAEROBIC Blood Culture results may not be optimal due to an inadequate volume of blood received in culture bottles   CULT  11/07/2023 0452    NO GROWTH 5 DAYS Performed at [facility], [address], [place], [place] [address]    REPTSTATUS 11/12/2023 FINAL 11/07/2023 0452     Radiological Exams on Admission: DG Chest Portable 1 View Result Date: 01/28/2024 CLINICAL DATA:  Shortness of breath. EXAM: PORTABLE CHEST 1 VIEW COMPARISON:  Chest radiograph dated 11/22/2023. FINDINGS: Cardiomegaly with vascular congestion and edema. Similar appearance of bilateral interstitial opacities which may represent edema or pneumonia. No large pleural effusion. No pneumothorax. No acute osseous pathology. IMPRESSION: Cardiomegaly with vascular congestion and edema. Pneumonia is not excluded. Overall no significant interval change. Electronically Signed   By: Angus Bark M.D.   On:  01/28/2024 16:37   _______________________________________________________________________________________________________ Latest  Blood pressure 116/77, pulse 80, temperature 97.9 F (36.6 C), temperature source Axillary, resp. rate  19, height 5\' 4"  (1.626 m), weight (!) 141.1 kg, SpO2 94%.   Vitals  labs and radiology finding personally reviewed  Review of Systems:    Pertinent positives include:   fatigue shortness of breath at rest.    dyspnea on exertion Bilateral lower extremity swelling  Constitutional:  No weight loss, night sweats, Fevers, chills,, weight loss  HEENT:  No headaches, Difficulty swallowing,Tooth/dental problems,Sore throat,  No sneezing, itching, ear ache, nasal congestion, post nasal drip,  Cardio-vascular:  No chest pain, Orthopnea, PND, anasarca, dizziness, palpitations.no  GI:  No heartburn, indigestion, abdominal pain, nausea, vomiting, diarrhea, change in bowel habits, loss of appetite, melena, blood in stool, hematemesis Resp:  no , No excess mucus, no productive cough, No non-productive cough, No coughing up of blood.No change in color of mucus.No wheezing. Skin:  no rash or lesions. No jaundice GU:  no dysuria, change in color of urine, no urgency or frequency. No straining to urinate.  No flank pain.  Musculoskeletal:  No joint pain or no joint swelling. No decreased range of motion. No back pain.  Psych:  No change in mood or affect. No depression or anxiety. No memory loss.  Neuro: no localizing neurological complaints, no tingling, no weakness, no double vision, no gait abnormality, no slurred speech, no confusion  All systems reviewed and apart from HOPI all are negative _______________________________________________________________________________________________ Past Medical History:   Past Medical History:  Diagnosis Date   Acquired hypothyroidism 11/13/2010   Qualifier: Diagnosis of   By: Audery Blazing, MD, Penney Bowling      Acute blood loss anemia 07/18/2022   Acute congestive heart failure (HCC) 03/18/2021   Acute hypoxemic respiratory failure (HCC) 11/03/2023   Acute hypoxic on chronic hypercapnic respiratory failure (HCC) 09/19/2023   Acute idiopathic gout  of left hand 02/16/2019   Acute kidney injury (HCC) 05/29/2023   Acute metabolic encephalopathy 09/29/2023   Acute on chronic diastolic CHF (congestive heart failure), NYHA class 3 (HCC) 09/25/2017   Acute on chronic respiratory failure with hypoxia and hypercapnia (HCC) 04/07/2022   Arthritis    "qwhere" (12/05/2017)   Asthma    Atrial flutter (HCC) 03/09/2021   Bleeding of the respiratory tract 04/07/2022   Bronchitis 05/30/2011   Cervical cancer (HCC) 2006   CHF exacerbation (HCC) 07/30/2022   Chronic heart failure with preserved ejection fraction (HCC) 02/23/2018   Chronic lower back pain    Chronic respiratory failure with hypoxia (HCC) 07/02/2018   ABG 11/2017 7.36/69   Consistent with obesity hypoventilation syndrome   Complication of anesthesia    "I have a hard time waking up from under it" (12/05/2017)   COPD (chronic obstructive pulmonary disease) (HCC) 03/04/2015   Coronary artery disease    Cough productive of clear sputum 06/03/2023   Diarrhea 06/18/2019   DJD (degenerative joint disease) 07/03/2021   DM2 (diabetes mellitus, type 2) (HCC) 11/10/2010   Dyspnea 12/05/2017   Dyspnea on exertion 02/09/2021   Edema 03/22/2011   Essential (primary) hypertension 11/10/2010   Formatting of this note might be different from the original.  Overview:   Qualifier: Diagnosis of   By: Nadean August      Last Assessment & Plan:   Watch blood pressure off of minoxidil and add medications as needed.   Essential hypertension 11/10/2010   Qualifier: Diagnosis of   By: [name], Kimalexis  Gastro-esophageal reflux disease without esophagitis 11/10/2010   GERD 11/10/2010   Qualifier: Diagnosis of   By: Nadean August       Hair loss 09/17/2019   HCAP (healthcare-associated pneumonia) 11/07/2023   Heart failure (HCC) 04/20/2021   Heart murmur    Hepatitis C    History of atrial fibrillation 09/04/2021   History of cervical cancer 03/22/2011   History of gout X 1   History of  tobacco use 04/20/2021   History of urinary retention 09/20/2023   Hx of atrial flutter 03/15/2021   Hyperglycemia due to type 2 diabetes mellitus (HCC) 04/20/2021   Hyperlipidemia    Hyperphosphatemia 06/04/2023   Hypertension associated with diabetes (HCC) 08/25/2018   Intertrigo 06/22/2023   Irritable bowel syndrome with diarrhea 06/18/2019   Limitation of activity due to disability 09/18/2021   Lymphedema of both lower extremities 03/22/2011   Metabolic alkalosis 03/28/2021   Microalbuminuria 04/20/2021   Migraine    "monthly" (12/05/2017)   Mild tricuspid regurgitation 04/02/2021   Mitral valve sclerosis 04/02/2021   Mixed hyperlipidemia 04/18/2020   Moderate aortic stenosis 04/20/2021   Mononeuropathy of lower extremity 04/09/2011   Formatting of this note might be different from the original.  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  Severe, corrected by CPAP 18 with C-flex of 3  2/13 bipap 20/14 sm ff mask h/h biflex+3cm  Formatting of this note might be different from the original.  Sleep study 06/2020: IMPRESSIONS  - Moderate obstructive sleep apnea occurred during this study (AHI = 17.7/h), mostly REM related  - No significant central sleep apnea occurred during   On home oxygen  therapy    "2L all the time"  (12/05/2017)   On supplemental oxygen  by nasal cannula 04/20/2021   Onychomycosis 06/18/2019   OSA treated with BiPAP    "have CPAP at home too; wearing BiPAP right now" (12/05/2017)   Osteoarthritis 11/28/2010   Qualifier: Diagnosis of   By: Earle Glatter, Scott       Other long term (current) drug therapy 02/12/2022   Pain of left middle finger 06/22/2023   Paroxysmal atrial fibrillation (HCC) 09/04/2021   Physical debility 05/10/2018   Formatting of this note might be different from the original.  Due to morbid obesity   Physical deconditioning 04/24/2021   Pneumonia    "several times" (12/05/2017)   Pneumonia due to gram-positive bacteria 08/24/2017   Pressure injury of both heels, unstageable (HCC) 07/03/2021   Pulmonary edema, acute (HCC) 02/21/2022   Renal insufficiency 04/20/2021   Restless leg syndrome 03/22/2021   Restrictive lung disease secondary to obesity 01/28/2022   S/P hysterectomy 02/19/2021   Tinea pedis of both feet 11/12/2018   Unspecified atrial flutter (HCC) 02/12/2022   Upper GI bleed 07/18/2022   Venous insufficiency of both lower extremities 07/02/2021  Weakness 03/20/2021   Weight loss 03/15/2021   Yeast infection 08/18/2023     Past Surgical History:  Procedure Laterality Date   ABDOMINAL SURGERY  2006 X 2   "TAH incision was infected"   BIOPSY  07/21/2022   Procedure: BIOPSY;  Surgeon: Felecia Hopper, MD;  Location: [facility];  Service: Gastroenterology;;   CHOLECYSTECTOMY OPEN     ESOPHAGOGASTRODUODENOSCOPY N/A 07/21/2022   Procedure: ESOPHAGOGASTRODUODENOSCOPY (EGD);  Surgeon: Felecia Hopper, MD;  Location: [facility];  Service: Gastroenterology;  Laterality: N/A;   TONSILLECTOMY     TOTAL ABDOMINAL HYSTERECTOMY  2006   TRACHEOSTOMY REVISION N/A 04/09/2022   Procedure: CONTROL OF BLEEDING,TRACHEOSTOMY SITE;  Surgeon: Virgina Grills, MD;  Location: [facility] OR;  Service: ENT;  Laterality: N/A;    Social History:      reports that she has never  smoked. She has never used smokeless tobacco. She reports that she does not currently use alcohol. She reports that she does not use drugs.     Family History:   Family History  Problem Relation Age of Onset   Heart disease Father    ______________________________________________________________________________________________ Allergies: Allergies  Allergen Reactions   Iodinated Contrast Media Anaphylaxis   Penicillins Anaphylaxis    Patient tolerates cefepime  (08/2017)   Insulin  Lispro Other (See Comments)    Adder per MAR from SNF   Minoxidil Hives     Prior to Admission medications   Medication Sig Start Date End Date Taking? Authorizing Provider  acetaminophen  (TYLENOL ) 500 MG tablet Take 2 tablets (1,000 mg total) by mouth every 8 (eight) hours. 11/26/23   Jonne Netters, MD  albuterol  (PROVENTIL ) (2.5 MG/3ML) 0.083% nebulizer solution Take 3 mLs (2.5 mg total) by nebulization every 6 (six) hours as needed for wheezing or shortness of breath (I50.32). Patient not taking: Reported on 11/22/2023 07/04/16   Lind Repine, MD  amiodarone  (PACERONE ) 200 MG tablet Take 1 tablet (200 mg total) by mouth daily. 06/21/23   Ernestina Headland, MD  atorvastatin  (LIPITOR) 20 MG tablet Take 20 mg by mouth every evening.    [provider]  buPROPion  ER (WELLBUTRIN  SR) 100 MG 12 hr tablet Take 1 tablet (100 mg total) by mouth 2 (two) times daily. 06/08/23   Ernestina Headland, MD  busPIRone  (BUSPAR ) 7.5 MG tablet Take 1 tablet (7.5 mg total) by mouth 2 (two) times daily. 10/07/23   Lavada Porteous, DO  Cholecalciferol  125 MCG (5000 UT) TABS Take 1 tablet (5,000 Units total) by mouth daily. Patient taking differently: Take 5,000 Units by mouth in the morning. 07/10/22   [name], Estil Heman, MD  diclofenac  Sodium (VOLTAREN ) 1 % GEL Apply 2-4 g topically in the morning and at bedtime. Apply to bilateral knees/L shoulder topically twice a day for arthritis 4g dose lower extremities, 2g dose upper  extremities    [provider]  ELIQUIS  5 MG TABS tablet Take 5 mg by mouth 2 (two) times daily. 04/12/21   [provider]  empagliflozin  (JARDIANCE ) 25 MG TABS tablet Take 1 tablet (25 mg total) by mouth daily. Patient taking differently: Take 25 mg by mouth in the morning. 08/20/23   Ivin Marrow, MD  ferrous sulfate  325 (65 FE) MG EC tablet Take 325 mg by mouth in the morning.    [provider]  Fluticasone -Umeclidin-Vilant (TRELEGY ELLIPTA ) 100-62.5-25 MCG/ACT AEPB Inhale 1 puff into the lungs daily. 10/04/23   Genora Kidd, MD  insulin  glargine (LANTUS ) 100 UNIT/ML Solostar Pen Inject 15 Units into the skin  in the morning. 11/15/23   Edison Gore, MD  levothyroxine  (SYNTHROID ) 150 MCG tablet Take 1 tablet (150 mcg total) by mouth daily at 6 (six) AM. Patient taking differently: Take 150 mcg by mouth in the morning. 07/10/22   [name], Estil Heman, MD  loperamide  (IMODIUM ) 2 MG capsule Take 2 mg by mouth every 4 (four) hours as needed for diarrhea or loose stools. Patient not taking: Reported on 11/22/2023    [provider]  loratadine  (CLARITIN ) 10 MG tablet Take 1 tablet (10 mg total) by mouth daily. 10/04/23   Genora Kidd, MD  melatonin 3 MG TABS tablet Take 3 mg by mouth at bedtime.    [provider]  metolazone  (ZAROXOLYN ) 2.5 MG tablet Take 1 tablet (2.5 mg total) by mouth 3 (three) times a week. 11/27/23   Jonne Netters, MD  metoprolol  succinate (TOPROL -XL) 50 MG 24 hr tablet Take 1 tablet (50 mg total) by mouth daily. Take with or immediately following a meal. Patient taking differently: Take 50 mg by mouth in the morning. Take with or immediately following a meal. 11/15/23   Edison Gore, MD  montelukast  (SINGULAIR ) 10 MG tablet Take 10 mg by mouth at bedtime.    [provider]  Multiple Vitamin (MULTIVITAMIN WITH MINERALS) TABS tablet Take 1 tablet by mouth in the morning.    [provider]  NON FORMULARY  Apply BIPAP/CPAP every night at bedtime.    [provider]  omeprazole  (PRILOSEC) 20 MG capsule Take 20 mg by mouth daily. 10/02/22   [provider]  ondansetron  (ZOFRAN ) 4 MG/2ML SOLN injection Inject 4 mg into the vein every 4 (four) hours as needed for nausea or vomiting.    [provider]  OXYGEN  Inhale 5 L/min into the lungs at bedtime. Connect to [employer] every night    [provider]  Potassium Chloride  ER 20 MEQ TBCR Take 2 tablets by mouth in the morning. 11/16/23   [provider]  rOPINIRole  (REQUIP ) 0.5 MG tablet Take 1 tablet (0.5 mg total) by mouth at bedtime. 06/21/23   Ernestina Headland, MD  tirzepatide [facility]) 2.5 MG/0.5ML Pen Inject 2.5 mg into the skin once a week. 11/26/23   Jonne Netters, MD  torsemide  40 MG TABS Take 80 mg by mouth 2 (two) times daily. 11/26/23   Jonne Netters, MD    ___________________________________________________________________________________________________ Physical Exam:    01/28/2024    7:15 PM 01/28/2024    6:50 PM 01/28/2024    4:01 PM  Vitals with BMI  Systolic 116 101 161  Diastolic 77 66 59  Pulse 80 79 78     1. General:  in No  Acute distress   Chronically ill   -appearing 2. Psychological: Alert and   Oriented 3. Head/ENT:   Moist   Mucous Membranes                          Head Non traumatic, neck supple                           Poor Dentition 4. SKIN: normal   Skin turgor,  Skin clean Dry and intact no rash    5. Heart: Regular rate and rhythm no  Murmur, no Rub or gallop 6. Lungs:  no wheezes some  crackles   7. Abdomen: Soft,  non-tender,   distended    obese  bowel sounds present distant  8. Lower extremities: no clubbing, cyanosis, chronic venostasis edema 9. Neurologically Grossly intact, moving all 4 extremities equally  10. MSK: Normal range of motion    Chart has been  reviewed  ______________________________________________________________________________________________  Assessment/Plan 58 y.o. female with medical history significant of DM2, morbid obesity, obesity hypoventilation syndrome, heart failure, A-fib, chronic respiratory failure with hypercapnia, hypertension hepatitis C, COPD    Admitted for  Acute on chronic hypoxic and hypercarbic respiratory failure , acute on chronic diastolic CHF exacerbation    Present on Admission:  Acute on chronic respiratory failure with hypoxia (HCC)  Chronic disease anemia  Hypokalemia  Chronic iron  deficiency anemia  Hypothyroidism  Obesity hypoventilation syndrome (HCC)  Acute on chronic diastolic CHF (congestive heart failure) (HCC)  Atrial flutter with rapid ventricular response (HCC)  (Resolved) Acute on chronic diastolic CHF (congestive heart failure) (HCC)     Acute on chronic respiratory failure with hypoxia (HCC) Currently on BiPAP Appreciate PCCM consult  this patient has acute respiratory failure with Hypoxia and  Hypercarbia as documented by the presence of following: O2 saturatio< 90% on 5L    Likely due to:  CHF exacerbation,  Provide O2 therapy and titrate as needed  Continuous pulse ox     flutter valve ordered   Chronic disease anemia Obtain anemia panel  Transfuse for Hg <7 , rapidly dropping or  if symptomatic   Hypokalemia - will replace electrolytes and repeat  check Mg, phos and Ca level and replace as needed Monitor on telemetry   Lab Results  Component Value Date   K 3.2 (L) 01/28/2024     Lab Results  Component Value Date   CREATININE 1.12 (H) 01/28/2024   Lab Results  Component Value Date   MG 2.1 11/26/2023   Lab Results  Component Value Date   CALCIUM  8.6 (L) 01/28/2024   PHOS 4.5 10/04/2023     Chronic iron  deficiency anemia Obtain anemia panel  Transfuse for Hg <7 , rapidly dropping or  if symptomatic   Hypothyroidism - Check TSH continue  home medications Synthroid  at 150 mcg po q day   Obesity hypoventilation syndrome (HCC) Contributing to comorbidity and complicating medical management  Body mass index is 53.38 kg/m.  Nutritional follow up as an out pt would be recommended Check ABG. Patient currently on BiPAP would benefit from PCCM consult  Type 2 diabetes mellitus without complication (HCC) Order sliding scale restart Lantus  15 units nightly  Atrial flutter with rapid ventricular response (HCC) Continue Eliquis  5 mg daily, Amiodarone  200 mg daily, If blood pressures allows restart Toprol  50 mg every morning  Lymphedema Chronic stable Pain management as needed  Acute on chronic diastolic CHF (congestive heart failure) (HCC) - Pt diagnosed with CHF based on presence of the following: rales on exam, JVD, cardiomegaly, Pulmonary edema on CXR, and   bilateral leg edema, DOE,  pleural effusion   With noted response to IV diuretic in ER  admit on telemetry,  cycle cardiac enzymes, Cardiac Panel (last 3 results) Recent Labs    01/28/24 1623 01/28/24 1729 01/28/24 2059  CKTOTAL  --   --  58  TROPONINIHS 6 7  --      obtain serial ECG  to evaluate for ischemia as a cause of heart failure  monitor daily weight:  Filed Weights   01/28/24 1553  Weight: (!) 141.1 kg   Last BNP BNP (last 3 results) Recent Labs    11/10/23 0540 11/22/23 1607 01/28/24 1623  BNP 111.6*  348.6* 284.4*     diurese with IV lasix    40 mg IV BID    and monitor orthostatics and creatinine to avoid over diuresis.  Order echogram to evaluate EF and valves       Other plan as per orders.  DVT prophylaxis:  eliquis       Code Status:    Code Status: Prior FULL CODE   as per patient   I had personally discussed CODE STATUS with patient   ACP    has been reviewed     Family Communication:   Family not at  Bedside    Diet  npo   Disposition Plan:     Back to current facility when stable                             Following barriers for discharge:                                                         Electrolytes corrected                               Anemia  stable                             Pain controlled with PO medications                                                           Will need to be able to tolerate PO                                                      Will need consultants to evaluate patient prior to discharge                           Work of breathing improves                        Would benefit from PT/OT eval prior to DC  Ordered                    Transition of care consulted                      Consults called: PCCM is aware   Admission status:  ED Disposition     ED Disposition  Admit   Condition  --   Comment  Hospital Area: [facility] [[id number]]  Level of Care: Progressive [102]  Admit to Progressive based on following criteria: CARDIOVASCULAR & THORACIC of moderate stability with acute coronary syndrome symptoms/low risk myocardial infarction/hypertensive urgency/arrhythmias/heart failure potentially compromising stability and stable post cardiovascular intervention patients.  Admit to Progressive based on following criteria: RESPIRATORY PROBLEMS  hypoxemic/hypercapnic respiratory failure that is responsive to NIPPV (BiPAP) or High Flow Nasal Cannula (6-80 lpm). Frequent assessment/intervention, no > Q2 hrs < Q4 hrs, to maintain oxygenation and pulmonary hygiene.  May admit patient to [facility] or [facility] if equivalent level of care is available:: No  Covid Evaluation: Asymptomatic - no recent exposure (last 10 days) testing not required  Diagnosis: Acute on chronic diastolic CHF (congestive heart failure) [facility]) [[id number]]  Admitting Physician: Keyra Virella [[id number]]  Attending Physician: Leisl Spurrier [[id number]]  Certification:: I certify this patient will need inpatient services for at least 2 midnights  Expected Medical  Readiness: 01/31/2024            inpatient     I Expect 2 midnight stay secondary to severity of patient's current illness need for inpatient interventions justified by the following:  hemodynamic instability despite optimal treatment (tachycardia  tachypnea hypoxia, hypercapnia)   Severe lab/radiological/exam abnormalities including:    Acute on chronic hypoxic and hypercarbic respiratory failure , acute on chronic diastolic CHF exacerbation  and extensive comorbidities including:    Chronic pain  DM2   CHF    COPD/asthma  Morbid Obesity   Chronic anticoagulation  That are currently affecting medical management.   I expect  patient to be hospitalized for 2 midnights requiring inpatient medical care.  Patient is at high risk for adverse outcome (such as loss of life or disability) if not treated.  Indication for inpatient stay as follows:    Hemodynamic instability despite maximal medical therapy,    inability to maintain oral hydration    New or worsening hypoxia    Need for , need for biPAP    Level of care       progressive     tele indefinitely please discontinue once patient no longer qualifies COVID-19 Labs    Valen Mascaro 01/28/2024, 11:26 PM    [facility]     after 2 AM please page floor coverage   If 7AM-7PM, please contact the day team taking care of the patient using [email or web address]

## 2024-01-28 NOTE — ED Notes (Signed)
 Pt continuing to complain about being hot,. Pt also urinated all over herself and the bed. Bedding changed and pure\wick applied.Pt refused to put on gown

## 2024-01-28 NOTE — Assessment & Plan Note (Signed)
-   Check TSH continue home medications Synthroid at po q day

## 2024-01-28 NOTE — ED Provider Notes (Signed)
 I provided a substantive portion of the care of this patient.  I personally made/approved the management plan for this patient and take responsibility for the patient management.      58 year old female presents with shortness of breath.  Patient chronically on 5 L at home.  States her last several days she is more winded.  On exam, she has diminished breath sounds.  Will check chest x-ray and labs.  Anticipate admission   Lind Repine, MD 01/28/24 [phone]

## 2024-01-28 NOTE — Assessment & Plan Note (Signed)
 Order sliding scale restart Lantus  15 units nightly

## 2024-01-28 NOTE — Assessment & Plan Note (Signed)
 Obtain anemia panel  Transfuse for Hg <7 , rapidly dropping or  if symptomatic

## 2024-01-28 NOTE — Assessment & Plan Note (Signed)
 Currently on BiPAP Appreciate PCCM consult  this patient has acute respiratory failure with Hypoxia and  Hypercarbia as documented by the presence of following: O2 saturatio< 90% on 5L    Likely due to:  CHF exacerbation,  Provide O2 therapy and titrate as needed  Continuous pulse ox     flutter valve ordered

## 2024-01-28 NOTE — Subjective & Objective (Signed)
 Shortness of breath with past 4 days She has been coming off of her Xanax  recently and feels that the symptoms are secondary to that.  Patient is on chronic oxygen .  CBG noted to 347  Patient with known history of CHF hypertension hepatitis C At baseline on 5 L FiO2

## 2024-01-28 NOTE — Assessment & Plan Note (Signed)
 Contributing to comorbidity and complicating medical management  Body mass index is 53.38 kg/m.  Nutritional follow up as an out pt would be recommended Check ABG. Patient currently on BiPAP would benefit from [facility] consult

## 2024-01-28 NOTE — Assessment & Plan Note (Signed)
-   will replace electrolytes and repeat  check Mg, phos and Ca level and replace as needed Monitor on telemetry   Lab Results  Component Value Date   K 3.2 (L) 01/28/2024     Lab Results  Component Value Date   CREATININE 1.12 (H) 01/28/2024   Lab Results  Component Value Date   MG 2.1 11/26/2023   Lab Results  Component Value Date   CALCIUM  8.6 (L) 01/28/2024   PHOS 4.5 10/04/2023

## 2024-01-28 NOTE — Hospital Course (Signed)
 Hypoxic, SOB Diminished breath sounds  Bnp min elevated Trop wnl

## 2024-01-28 NOTE — ED Triage Notes (Signed)
 Coming from [facility] complaining of increased sob x 4 days.  Recently reduced xanax  medication and it has correlated with her symptoms.  90% 6L but was not a good phleth.  CBG 347  BP 158/76 HR 72

## 2024-01-28 NOTE — ED Provider Notes (Signed)
 [place] EMERGENCY DEPARTMENT AT [facility] Provider Note   CSN: [id number] Arrival date & time: 01/28/24  1551     History Chief Complaint  Patient presents with   Shortness of Breath    Chelsea [name] is a 58 y.o. female.  Patient with significant comorbidities including congestive heart failure, type 2 diabetes, atrial fibrillation, chronic respiratory failure with hypercapnia, hypertension, hepatitis C presents the emergency department today with concerns of shortness of breath. Chronically on 5L/min.  Patient reports that over the last 4 days has had some worsening shortness of breath particularly with exertion.  No recent fever, chills or bodyaches.  Patient was additionally noted to be hypoxic in the 80s presents even with 5 to 10 L via nasal cannula applied.   Shortness of Breath      Home Medications Prior to Admission medications   Medication Sig Start Date End Date Taking? Authorizing Provider  acetaminophen  (TYLENOL ) 500 MG tablet Take 2 tablets (1,000 mg total) by mouth every 8 (eight) hours. 11/26/23   Jonne Netters, MD  albuterol  (PROVENTIL ) (2.5 MG/3ML) 0.083% nebulizer solution Take 3 mLs (2.5 mg total) by nebulization every 6 (six) hours as needed for wheezing or shortness of breath (I50.32). Patient not taking: Reported on 11/22/2023 07/04/16   Lind Repine, MD  amiodarone  (PACERONE ) 200 MG tablet Take 1 tablet (200 mg total) by mouth daily. 06/21/23   Ernestina Headland, MD  atorvastatin  (LIPITOR) 20 MG tablet Take 20 mg by mouth every evening.    [provider]  buPROPion  ER (WELLBUTRIN  SR) 100 MG 12 hr tablet Take 1 tablet (100 mg total) by mouth 2 (two) times daily. 06/08/23   Ernestina Headland, MD  busPIRone  (BUSPAR ) 7.5 MG tablet Take 1 tablet (7.5 mg total) by mouth 2 (two) times daily. 10/07/23   Lavada Porteous, DO  Cholecalciferol  125 MCG (5000 UT) TABS Take 1 tablet (5,000 Units total) by mouth daily. Patient taking differently: Take  5,000 Units by mouth in the morning. 07/10/22   [name], Estil Heman, MD  diclofenac  Sodium (VOLTAREN ) 1 % GEL Apply 2-4 g topically in the morning and at bedtime. Apply to bilateral knees/L shoulder topically twice a day for arthritis 4g dose lower extremities, 2g dose upper extremities    [provider]  ELIQUIS  5 MG TABS tablet Take 5 mg by mouth 2 (two) times daily. 04/12/21   [provider]  empagliflozin  (JARDIANCE ) 25 MG TABS tablet Take 1 tablet (25 mg total) by mouth daily. Patient taking differently: Take 25 mg by mouth in the morning. 08/20/23   Ivin Marrow, MD  ferrous sulfate  325 (65 FE) MG EC tablet Take 325 mg by mouth in the morning.    [provider]  Fluticasone -Umeclidin-Vilant (TRELEGY ELLIPTA ) 100-62.5-25 MCG/ACT AEPB Inhale 1 puff into the lungs daily. 10/04/23   Genora Kidd, MD  insulin  glargine (LANTUS ) 100 UNIT/ML Solostar Pen Inject 15 Units into the skin in the morning. 11/15/23   Edison Gore, MD  levothyroxine  (SYNTHROID ) 150 MCG tablet Take 1 tablet (150 mcg total) by mouth daily at 6 (six) AM. Patient taking differently: Take 150 mcg by mouth in the morning. 07/10/22   [name], Estil Heman, MD  loperamide  (IMODIUM ) 2 MG capsule Take 2 mg by mouth every 4 (four) hours as needed for diarrhea or loose stools. Patient not taking: Reported on 11/22/2023    [provider]  loratadine  (CLARITIN ) 10 MG tablet Take 1 tablet (10 mg total) by mouth daily.  10/04/23   Genora Kidd, MD  melatonin 3 MG TABS tablet Take 3 mg by mouth at bedtime.    [provider]  metolazone  (ZAROXOLYN ) 2.5 MG tablet Take 1 tablet (2.5 mg total) by mouth 3 (three) times a week. 11/27/23   Jonne Netters, MD  metoprolol  succinate (TOPROL -XL) 50 MG 24 hr tablet Take 1 tablet (50 mg total) by mouth daily. Take with or immediately following a meal. Patient taking differently: Take 50 mg by mouth in the morning. Take with or immediately following a  meal. 11/15/23   Edison Gore, MD  montelukast  (SINGULAIR ) 10 MG tablet Take 10 mg by mouth at bedtime.    [provider]  Multiple Vitamin (MULTIVITAMIN WITH MINERALS) TABS tablet Take 1 tablet by mouth in the morning.    [provider]  NON FORMULARY Apply BIPAP/CPAP every night at bedtime.    [provider]  omeprazole  (PRILOSEC) 20 MG capsule Take 20 mg by mouth daily. 10/02/22   [provider]  ondansetron  (ZOFRAN ) 4 MG/2ML SOLN injection Inject 4 mg into the vein every 4 (four) hours as needed for nausea or vomiting.    [provider]  OXYGEN  Inhale 5 L/min into the lungs at bedtime. Connect to [employer] every night    [provider]  Potassium Chloride  ER 20 MEQ TBCR Take 2 tablets by mouth in the morning. 11/16/23   [provider]  rOPINIRole  (REQUIP ) 0.5 MG tablet Take 1 tablet (0.5 mg total) by mouth at bedtime. 06/21/23   Ernestina Headland, MD  tirzepatide [facility]) 2.5 MG/0.5ML Pen Inject 2.5 mg into the skin once a week. 11/26/23   Jonne Netters, MD  torsemide  40 MG TABS Take 80 mg by mouth 2 (two) times daily. 11/26/23   Jonne Netters, MD      Allergies    Iodinated contrast media, Penicillins, Insulin  lispro, and Minoxidil    Review of Systems   Review of Systems  Respiratory:  Positive for shortness of breath.   All other systems reviewed and are negative.   Physical Exam Updated Vital Signs BP 99/73   Pulse 79   Temp (!) 97.3 F (36.3 C) (Oral)   Resp 20   Ht 5\' 4"  (1.626 m)   Wt (!) 141.1 kg   SpO2 97%   BMI 53.38 kg/m  Physical Exam Vitals and nursing note reviewed.  Constitutional:      General: She is in acute distress.     Appearance: She is well-developed. She is obese. She is ill-appearing.  HENT:     Head: Normocephalic and atraumatic.  Eyes:     Conjunctiva/sclera: Conjunctivae normal.  Cardiovascular:     Rate and Rhythm: Normal rate and regular rhythm.     Heart sounds: No  murmur heard. Pulmonary:     Effort: Pulmonary effort is normal. No respiratory distress.     Breath sounds: Decreased breath sounds present. No wheezing, rhonchi or rales.  Abdominal:     Palpations: Abdomen is soft.     Tenderness: There is no abdominal tenderness.  Musculoskeletal:        General: No swelling.     Cervical back: Neck supple.     Right lower leg: Edema present.     Left lower leg: Edema present.     Comments: Bilateral lower extremities with significant tension present with mild pitting edema.  Skin:    General: Skin is warm and dry.     Capillary Refill: Capillary refill  takes less than 2 seconds.  Neurological:     Mental Status: She is alert.  Psychiatric:        Mood and Affect: Mood normal.     ED Results / Procedures / Treatments   Labs (all labs ordered are listed, but only abnormal results are displayed) Labs Reviewed  CBC WITH DIFFERENTIAL/PLATELET - Abnormal; Notable for the following components:      Result Value   RBC 3.79 (*)    Hemoglobin 10.3 (*)    HCT 34.1 (*)    All other components within normal limits  BRAIN NATRIURETIC PEPTIDE - Abnormal; Notable for the following components:   B Natriuretic Peptide 284.4 (*)    All other components within normal limits  COMPREHENSIVE METABOLIC PANEL WITH GFR - Abnormal; Notable for the following components:   Potassium 3.2 (*)    Chloride 83 (*)    CO2 40 (*)    Glucose, Bld 232 (*)    BUN 40 (*)    Creatinine, Ser 1.12 (*)    Calcium  8.6 (*)    Albumin  2.8 (*)    AST 12 (*)    GFR, Estimated 56 (*)    All other components within normal limits  HEMOGLOBIN A1C - Abnormal; Notable for the following components:   Hgb A1c MFr Bld 8.1 (*)    All other components within normal limits  PHOSPHORUS - Abnormal; Notable for the following components:   Phosphorus 5.8 (*)    All other components within normal limits  PROTIME-INR - Abnormal; Notable for the following components:   Prothrombin  Time 18.7  (*)    INR 1.5 (*)    All other components within normal limits  IRON  AND TIBC - Abnormal; Notable for the following components:   Iron  24 (*)    Saturation Ratios 6 (*)    All other components within normal limits  RETICULOCYTES - Abnormal; Notable for the following components:   RBC. 3.75 (*)    Immature Retic Fract 30.5 (*)    All other components within normal limits  I-STAT VENOUS BLOOD GAS, ED - Abnormal; Notable for the following components:   pCO2, Ven 73.3 (*)    pO2, Ven 46 (*)    Bicarbonate 46.7 (*)    TCO2 49 (*)    Acid-Base Excess 19.0 (*)    Potassium 3.2 (*)    Calcium , Ion 1.00 (*)    HCT 33.0 (*)    Hemoglobin 11.2 (*)    All other components within normal limits  CBG MONITORING, ED - Abnormal; Notable for the following components:   Glucose-Capillary 171 (*)    All other components within normal limits  CBG MONITORING, ED - Abnormal; Notable for the following components:   Glucose-Capillary 153 (*)    All other components within normal limits  RESP PANEL BY RT-PCR (RSV, FLU A&B, COVID)  RVPGX2  PROCALCITONIN  AMMONIA  LACTIC ACID , PLASMA  MAGNESIUM   CK  TSH  FERRITIN  PREALBUMIN  URINALYSIS, COMPLETE (UACMP) WITH MICROSCOPIC  RAPID URINE DRUG SCREEN, HOSP PERFORMED  VITAMIN B12  FOLATE  MAGNESIUM   PHOSPHORUS  COMPREHENSIVE METABOLIC PANEL WITH GFR  CBC  TROPONIN I (HIGH SENSITIVITY)  TROPONIN I (HIGH SENSITIVITY)    EKG EKG Interpretation Date/Time:  Tuesday January 28 2024 16:38:42 EDT Ventricular Rate:  70 PR Interval:  171 QRS Duration:  106 QT Interval:  439 QTC Calculation: 474 R Axis:   79  Text Interpretation: Sinus rhythm Borderline repolarization abnormality No  significant change since last tracing Confirmed by Lind Repine ([id number]) on 01/28/2024 5:58:45 PM  Radiology DG Chest Portable 1 View Result Date: 01/28/2024 CLINICAL DATA:  Shortness of breath. EXAM: PORTABLE CHEST 1 VIEW COMPARISON:  Chest radiograph dated 11/22/2023.  FINDINGS: Cardiomegaly with vascular congestion and edema. Similar appearance of bilateral interstitial opacities which may represent edema or pneumonia. No large pleural effusion. No pneumothorax. No acute osseous pathology. IMPRESSION: Cardiomegaly with vascular congestion and edema. Pneumonia is not excluded. Overall no significant interval change. Electronically Signed   By: Angus Bark M.D.   On: 01/28/2024 16:37    Procedures .Critical Care  Performed by: Toree Edling A, PA-C Authorized by: Darcie Mellone A, PA-C   Critical care provider statement:    Critical care time (minutes):  54   Critical care start time:  01/28/2024 7:36 PM   Critical care end time:  01/28/2024 8:30 PM   Critical care time was exclusive of:  Separately billable procedures and treating other patients   Critical care was necessary to treat or prevent imminent or life-threatening deterioration of the following conditions:  Respiratory failure   Critical care was time spent personally by me on the following activities:  Development of treatment plan with patient or surrogate, discussions with consultants, evaluation of patient's response to treatment, examination of patient, pulse oximetry, ordering and review of radiographic studies, ordering and review of laboratory studies, review of old charts and re-evaluation of patient's condition   I assumed direction of critical care for this patient from another provider in my specialty: no     Care discussed with: admitting provider       Medications Ordered in ED Medications  insulin  aspart (novoLOG ) injection 0-9 Units (2 Units Subcutaneous Given 01/28/24 2333)  fentaNYL  (SUBLIMAZE ) injection 25 mcg (25 mcg Intravenous Given 01/28/24 2333)  amiodarone  (PACERONE ) tablet 200 mg (has no administration in time range)  atorvastatin  (LIPITOR) tablet 20 mg (20 mg Oral Given 01/28/24 2332)  buPROPion  ER (WELLBUTRIN  SR) 12 hr tablet 100 mg (100 mg Oral Given 01/28/24 2336)   apixaban  (ELIQUIS ) tablet 5 mg (5 mg Oral Not Given 01/28/24 2347)  insulin  glargine-yfgn (SEMGLEE ) injection 10 Units (10 Units Subcutaneous Not Given 01/28/24 2347)  levothyroxine  (SYNTHROID ) tablet 150 mcg (has no administration in time range)  metoprolol  succinate (TOPROL -XL) 24 hr tablet 50 mg (has no administration in time range)  acetaminophen  (TYLENOL ) tablet 650 mg (has no administration in time range)    Or  acetaminophen  (TYLENOL ) suppository 650 mg (has no administration in time range)  HYDROcodone -acetaminophen  (NORCO/VICODIN) 5-325 MG per tablet 1-2 tablet (has no administration in time range)  ondansetron  (ZOFRAN ) tablet 4 mg (has no administration in time range)    Or  ondansetron  (ZOFRAN ) injection 4 mg (has no administration in time range)  furosemide  (LASIX ) injection 40 mg (has no administration in time range)  sodium chloride  flush (NS) 0.9 % injection 3 mL (3 mLs Intravenous Given 01/28/24 2333)  sodium chloride  flush (NS) 0.9 % injection 3 mL (has no administration in time range)  0.9 %  sodium chloride  infusion (has no administration in time range)  docusate sodium  (COLACE) capsule 100 mg (100 mg Oral Patient Refused/Not Given 01/28/24 2332)  senna (SENOKOT) tablet 8.6 mg (8.6 mg Oral Patient Refused/Not Given 01/28/24 2332)  polyethylene glycol (MIRALAX  / GLYCOLAX ) packet 17 g (has no administration in time range)  albuterol  (PROVENTIL ) (2.5 MG/3ML) 0.083% nebulizer solution 2.5 mg (has no administration in time range)  guaiFENesin  (MUCINEX ) 12  hr tablet 600 mg (600 mg Oral Given 01/28/24 2332)  furosemide  (LASIX ) injection 40 mg (40 mg Intravenous Given 01/28/24 1812)  cefTRIAXone  (ROCEPHIN ) 1 g in sodium chloride  0.9 % 100 mL IVPB (0 g Intravenous Stopped 01/28/24 1847)  azithromycin  (ZITHROMAX ) 500 mg in sodium chloride  0.9 % 250 mL IVPB (0 mg Intravenous Stopped 01/28/24 1947)  fentaNYL  (SUBLIMAZE ) injection 25 mcg (25 mcg Intravenous Given 01/28/24 1820)  LORazepam   (ATIVAN ) injection 0.5 mg (0.5 mg Intravenous Given 01/28/24 1821)  potassium chloride  SA (KLOR-CON  M) CR tablet 40 mEq (40 mEq Oral Given 01/28/24 2120)    ED Course/ Medical Decision Making/ A&P                                 Medical Decision Making Amount and/or Complexity of Data Reviewed Labs: ordered. Radiology: ordered.  Risk Prescription drug management. Decision regarding hospitalization.   This patient presents to the ED for concern of shortness of breath. Differential diagnosis includes CHF, respiratory failure,    Lab Tests:  I Ordered, and personally interpreted labs.  The pertinent results include: CBC shows anemia with hemoglobin of 10.3, CMP with mild hypokalemia but not as AKI, BNP slightly elevated at 284.4 and troponin normal at 7.   Imaging Studies ordered:  I ordered imaging studies including xray chest  I independently visualized and interpreted imaging which showed cardiomegaly with vascular congestion and edema. Pneumonia is not excluded. Overall no significant interval change. I agree with the radiologist interpretation   Medicines ordered and prescription drug management:  I ordered medication including Lasix , Rocephin , azithromycin , potassium for fluid overload, pneumonia, hypokalemia Reevaluation of the patient after these medicines showed that the patient improved I have reviewed the patients home medicines and have made adjustments as needed   Problem List / ED Course:  Patient with past history significant for CHF, type 2 diabetes, atrial fibrillation, chronic respiratory failure with hypercapnia, hypertension, hepatitis C presents the emergency department today with concerns of shortness of breath.  Patient is chronically on 5 L/min.  Reports of the last 3 to 4 days has had notable worsening in work of breathing.  She states that she had recently received increased dose of Lasix  via injection.  States that she is having a more difficult time  with ambulation due to shortness of breath.  Denies any focal chest symptoms such as chest pain, congestion, or productive coughing. Physical exam reveals an ill-appearing obese female.  She appears to be having increased work of breathing.  Lung sounds are diminished in all lung fields.  No appreciable wheezing, crackles, or rales present.  Based on clinical appearance and patient currently requiring 10 L via nasal cannula to maintain oxygen  saturation 85%, anticipate that patient will require admission.  Will trial BiPAP for patient given significant work of breathing and hypoxia. On reassessment, patient does have some improved clinical appearance after starting BiPAP.  She appears more comfortable although she is reporting that she feels hot from the mask on her face.  Oxygen  saturation improving maintaining between 95 to 98% on 50% BiPAP. Labs show mild evidence of fluid overload with BNP slightly elevated to 84.  Troponin unremarkable at 7.  CBC and CMP unremarkable.  Chest x-ray does show some cardiomegaly with vascular congestion and edema and pneumonia is unable to be excluded.  Given concerning clinical appearance, patient was started on azithromycin  and Rocephin .  Will consult hospitalist for possible admission.  Spoke with Dr. Hendrick Locke, hospitalist, who was somewhat apprehensive about admission initially due to concerns of patient's status and currently being at 50% on BiPAP.  Advise discussing patient care with critical care for possible management of patient.  Added on procalcitonin and VBG per hospitalist request. Spoke with Dr. Mason Sole, critical care, who stated that he would evaluate patient for possible admission. Have not heard any further from Dr. Mason Sole. Dr. Hendrick Locke is currently admitting patient at this time.   Final Clinical Impression(s) / ED Diagnoses Final diagnoses:  Acute on chronic hypoxic respiratory failure (HCC)  Pneumonia of both lungs due to infectious organism, unspecified  part of lung    Rx / DC Orders ED Discharge Orders     None         Oretha Birch 01/28/24 2352    Lind Repine, MD 01/29/24 2035

## 2024-01-29 ENCOUNTER — Inpatient Hospital Stay (HOSPITAL_COMMUNITY)

## 2024-01-29 DIAGNOSIS — I5033 Acute on chronic diastolic (congestive) heart failure: Secondary | ICD-10-CM | POA: Diagnosis not present

## 2024-01-29 DIAGNOSIS — J9621 Acute and chronic respiratory failure with hypoxia: Secondary | ICD-10-CM | POA: Diagnosis not present

## 2024-01-29 DIAGNOSIS — I4892 Unspecified atrial flutter: Secondary | ICD-10-CM | POA: Diagnosis not present

## 2024-01-29 DIAGNOSIS — E66813 Obesity, class 3: Secondary | ICD-10-CM

## 2024-01-29 DIAGNOSIS — J9622 Acute and chronic respiratory failure with hypercapnia: Secondary | ICD-10-CM

## 2024-01-29 DIAGNOSIS — E876 Hypokalemia: Secondary | ICD-10-CM | POA: Diagnosis not present

## 2024-01-29 DIAGNOSIS — I5031 Acute diastolic (congestive) heart failure: Secondary | ICD-10-CM

## 2024-01-29 DIAGNOSIS — E119 Type 2 diabetes mellitus without complications: Secondary | ICD-10-CM | POA: Diagnosis not present

## 2024-01-29 LAB — CBC
HCT: 30.8 % — ABNORMAL LOW (ref 36.0–46.0)
Hemoglobin: 9.4 g/dL — ABNORMAL LOW (ref 12.0–15.0)
MCH: 27.6 pg (ref 26.0–34.0)
MCHC: 30.5 g/dL (ref 30.0–36.0)
MCV: 90.6 fL (ref 80.0–100.0)
Platelets: 287 10*3/uL (ref 150–400)
RBC: 3.4 MIL/uL — ABNORMAL LOW (ref 3.87–5.11)
RDW: 14.9 % (ref 11.5–15.5)
WBC: 7.1 10*3/uL (ref 4.0–10.5)
nRBC: 0 % (ref 0.0–0.2)

## 2024-01-29 LAB — ECHOCARDIOGRAM COMPLETE
AR max vel: 0.69 cm2
AV Area VTI: 0.74 cm2
AV Area mean vel: 0.73 cm2
AV Mean grad: 21 mmHg
AV Peak grad: 40.4 mmHg
Ao pk vel: 3.18 m/s
Area-P 1/2: 4.39 cm2
Height: 64 in
MV M vel: 4.2 m/s
MV Peak grad: 70.6 mmHg
S' Lateral: 3.1 cm
Weight: 4976 [oz_av]

## 2024-01-29 LAB — URINALYSIS, COMPLETE (UACMP) WITH MICROSCOPIC
Bilirubin Urine: NEGATIVE
Glucose, UA: >=500 mg/dL — AB
Hgb urine dipstick: NEGATIVE
Ketones, ur: NEGATIVE mg/dL
Leukocytes,Ua: NEGATIVE
Nitrite: NEGATIVE
Protein, ur: NEGATIVE mg/dL
Specific Gravity, Urine: 1.006 (ref 1.005–1.030)
pH: 6 (ref 5.0–8.0)

## 2024-01-29 LAB — COMPREHENSIVE METABOLIC PANEL WITH GFR
ALT: 11 U/L (ref 0–44)
AST: 12 U/L — ABNORMAL LOW (ref 15–41)
Albumin: 2.6 g/dL — ABNORMAL LOW (ref 3.5–5.0)
Alkaline Phosphatase: 90 U/L (ref 38–126)
Anion gap: 12 (ref 5–15)
BUN: 41 mg/dL — ABNORMAL HIGH (ref 6–20)
CO2: 42 mmol/L — ABNORMAL HIGH (ref 22–32)
Calcium: 8.5 mg/dL — ABNORMAL LOW (ref 8.9–10.3)
Chloride: 86 mmol/L — ABNORMAL LOW (ref 98–111)
Creatinine, Ser: 0.98 mg/dL (ref 0.44–1.00)
GFR, Estimated: >60 mL/min (ref >60)
Glucose, Bld: 131 mg/dL — ABNORMAL HIGH (ref 70–99)
Potassium: 3 mmol/L — ABNORMAL LOW (ref 3.5–5.1)
Sodium: 140 mmol/L (ref 135–145)
Total Bilirubin: 0.7 mg/dL (ref 0.0–1.2)
Total Protein: 7 g/dL (ref 6.5–8.1)

## 2024-01-29 LAB — GLUCOSE, CAPILLARY
Glucose-Capillary: 126 mg/dL — ABNORMAL HIGH (ref 70–99)
Glucose-Capillary: 135 mg/dL — ABNORMAL HIGH (ref 70–99)
Glucose-Capillary: 145 mg/dL — ABNORMAL HIGH (ref 70–99)
Glucose-Capillary: 175 mg/dL — ABNORMAL HIGH (ref 70–99)
Glucose-Capillary: 188 mg/dL — ABNORMAL HIGH (ref 70–99)
Glucose-Capillary: 215 mg/dL — ABNORMAL HIGH (ref 70–99)

## 2024-01-29 LAB — PHOSPHORUS: Phosphorus: 5 mg/dL — ABNORMAL HIGH (ref 2.5–4.6)

## 2024-01-29 LAB — VITAMIN B12: Vitamin B-12: 196 pg/mL (ref 180–914)

## 2024-01-29 LAB — RAPID URINE DRUG SCREEN, HOSP PERFORMED
Amphetamines: NOT DETECTED
Barbiturates: NOT DETECTED
Benzodiazepines: NOT DETECTED
Cocaine: NOT DETECTED
Opiates: POSITIVE — AB
Tetrahydrocannabinol: NOT DETECTED

## 2024-01-29 LAB — PREALBUMIN: Prealbumin: 16 mg/dL — ABNORMAL LOW (ref 18–38)

## 2024-01-29 LAB — MAGNESIUM: Magnesium: 2.2 mg/dL (ref 1.7–2.4)

## 2024-01-29 LAB — FOLATE: Folate: 16.5 ng/mL (ref >5.9)

## 2024-01-29 MED ORDER — INSULIN GLARGINE-YFGN 100 UNIT/ML ~~LOC~~ SOLN
10.0000 [IU] | Freq: Every day | SUBCUTANEOUS | Status: DC
  Administered 2024-01-29 – 2024-02-03 (×5): 10 [IU] via SUBCUTANEOUS
  Filled 2024-01-29 (×8): qty 0.1

## 2024-01-29 MED ORDER — POTASSIUM CHLORIDE CRYS ER 20 MEQ PO TBCR
40.0000 meq | EXTENDED_RELEASE_TABLET | ORAL | Status: AC
  Administered 2024-01-29 (×2): 40 meq via ORAL
  Filled 2024-01-29 (×2): qty 2

## 2024-01-29 MED ORDER — APIXABAN 5 MG PO TABS
5.0000 mg | ORAL_TABLET | Freq: Two times a day (BID) | ORAL | Status: DC
Start: 2024-01-29 — End: 2024-02-19
  Administered 2024-01-29 – 2024-02-18 (×40): 5 mg via ORAL
  Filled 2024-01-29 (×41): qty 1

## 2024-01-29 MED ORDER — FUROSEMIDE 10 MG/ML IJ SOLN
60.0000 mg | Freq: Two times a day (BID) | INTRAMUSCULAR | Status: DC
  Administered 2024-01-29 – 2024-01-30 (×2): 60 mg via INTRAVENOUS
  Filled 2024-01-29 (×2): qty 6

## 2024-01-29 MED ORDER — SPIRONOLACTONE 12.5 MG HALF TABLET
12.5000 mg | ORAL_TABLET | Freq: Every day | ORAL | Status: DC
  Administered 2024-01-29 – 2024-02-14 (×16): 12.5 mg via ORAL
  Filled 2024-01-29 (×16): qty 1

## 2024-01-29 MED ORDER — HYDROCODONE-ACETAMINOPHEN 5-325 MG PO TABS
1.0000 | ORAL_TABLET | Freq: Four times a day (QID) | ORAL | Status: DC | PRN
  Administered 2024-01-29 – 2024-02-01 (×8): 1 via ORAL
  Filled 2024-01-29 (×8): qty 1

## 2024-01-29 NOTE — Progress Notes (Signed)
 Pt taken off bipap and placed on 8L [place] by RT. Pt on 8L end tidal [place], SpO2 90%, End tidal 50 at this time. RN aware, CCM MD aware, BiPAP at bedside in standby, RN aware.      01/29/24 1020  Therapy Vitals  Pulse Rate 73  Resp 18  MEWS Score/Color  MEWS Score 1  MEWS Score Color Green  Respiratory Assessment  Assessment Type Assess only  Respiratory Pattern Regular;Unlabored  Chest Assessment Chest expansion symmetrical  Cough Non-productive  Bilateral Breath Sounds Diminished  Oxygen  Therapy/Pulse Ox  O2 Device (S)  Nasal Cannula  O2 Flow Rate (L/min) (S)  8 L/min  SpO2 90 %

## 2024-01-29 NOTE — Progress Notes (Signed)
 PT Cancellation Note  Patient Details Name: Chelsea [name] MRN: [id number] DOB: Nov 21, 1965   Cancelled Treatment:    Reason Eval/Treat Not Completed: Medical issues which prohibited therapy. Pt currently on continuous bipap. Will await respiratory assessment/ability to wean patient off bipap. PT to return as able, as appropriate to complete PT eval.   Renaee Caro, PT, DPT Acute Rehabilitation Services Secure chat preferred Office #: [phone]    Jenna Moan 01/29/2024, 7:41 AM

## 2024-01-29 NOTE — Assessment & Plan Note (Addendum)
 AKI  Renal function today with serum cr at 0,8 with K at 3,8 and serum bicarbonate at 34  Na 138   Continue diuresis, sequential nephron blockade with acetazolamide , furosemide  and spironolactone .   20 meq Kcl daily to prevent hypokalemia.

## 2024-01-29 NOTE — Assessment & Plan Note (Signed)
 Chronic stable Pain management as needed

## 2024-01-29 NOTE — Plan of Care (Signed)
  Problem: Coping: Goal: Ability to adjust to condition or change in health will improve Outcome: Not Progressing   Problem: Fluid Volume: Goal: Ability to maintain a balanced intake and output will improve Outcome: Not Progressing   Problem: Nutritional: Goal: Maintenance of adequate nutrition will improve Outcome: Not Progressing

## 2024-01-29 NOTE — Assessment & Plan Note (Addendum)
 Continue sinus rhythm Continue rate control with metoprolol  and amiodarone   Anticoagulation with apixaban .

## 2024-01-29 NOTE — Assessment & Plan Note (Signed)
 Continue Eliquis  5 mg daily, Amiodarone  200 mg daily, If blood pressures allows restart Toprol  50 mg every morning

## 2024-01-29 NOTE — Progress Notes (Signed)
 Patient arrived from ED with 2 RNs and respiratory on bipap. Patient transferred over to our bed. CHG wipes done; CCMD notified.

## 2024-01-29 NOTE — Progress Notes (Signed)
  Progress Note   Patient: Chelsea [name] BJY:[id number] DOB: 1966-08-04 DOA: 01/28/2024     1 DOS: the patient was seen and examined on 01/29/2024   Brief hospital course: Chelsea [name] was admitted to the hospital with the working diagnosis of acute on chronic hypercapnic and hypoxemic respiratory failure.   58 yo female with the past medical history of T2DM, obesity hypoventilation syndrome, hypertension, COPD and obesity class 3 who presented with dyspnea.  Reported worsening symptoms for the last 4 days. Noted recently weaning off alprazolam .  On her initial physical examination her blood pressure was 127/57, HR 73, RR 18 and 02 saturation 85%  Lungs with no wheezing, positive bilateral rales, heart with S1 and S2 present and regular, abdomen with no distention and no lower extremity edema.   Assessment and Plan: * Acute on chronic diastolic CHF (congestive heart failure) (HCC) Echocardiogram pending, 11/2023 with preserved LV systolic function.   Continue volume overloaded.   Plan to continue diuresis with furosemide , will increase to 60 mg IV bid Continue metoprolol  and will add spironolactone    Acute on chronic hypoxemic and hypercapnic respiratory failure Obesity hypoventilation syndrome  Plan to continue diuresis, biapap for sleep and as needed during the day.   Atrial flutter with rapid ventricular response (HCC) Continue rate control with metoprolol / amiodarone  and anticoagulation with apixaban .  Continue telemetry monitoring.   Hypokalemia Renal function with serum cr at 0,98 with K at 3,0 and serum bicarbonate at 42 Na 140 and Mg 2.2  Plan to continue diuresis with furosemide  K correction with Kcl 40 meq x 2 doses Follow up renal function and electrolytes in am.   Type 2 diabetes mellitus without complication (HCC) Continue glucose cover and monitoring with insulin  sliding scale Continue basal insulin  Patient is tolerating po well, her fasting glucose today  is 131 mg/dl.   Chronic iron  deficiency anemia Cell count has been stable   Hypothyroidism Continue levothyroxine    Obesity, class 3 Calculated BMI is 53.3         Subjective: Patient with improvement in dyspnea and edema but not yet back to baseline, no chest pain. At home uses wheelchair for mobility   Physical Exam: Vitals:   01/29/24 0148 01/29/24 0205 01/29/24 0217 01/29/24 0739  BP: 111/73  103/87 (!) 95/50  Pulse: 75  75 70  Resp: 20  20 17   Temp:   97.6 F (36.4 C) 98.1 F (36.7 C)  TempSrc:   Axillary Axillary  SpO2: 100% 98% 96% 94%  Weight:      Height:       Neurology awake and alert ENT with mild pallor Cardiovascular with S1 and S2 present and regular with no gallops, rubs or murmurs Respiratory with no rales or wheezing, no rhonchi, decreased breath sounds bilaterally Abdomen with no distention  Positive lower extremity edema ++ bilaterally  Data Reviewed:    Family Communication: no family at the bedside   Disposition: Status is: Inpatient Remains inpatient appropriate because: IV diuresis   Planned Discharge Destination: Home     Author: Albertus Alt, MD 01/29/2024 9:32 AM  For on call review [email or web address].

## 2024-01-29 NOTE — Assessment & Plan Note (Addendum)
 Echocardiogram with preserved LV systolic function with EF 60 to 65%, RV systolic function preserved, moderate valve aortic stenosis, RVSP 26,6 mmHg, LA and RA not enlarged, no pericardial effusion.   05/04 CT chest with bilateral ground glass opacities, more dense infiltrates at the bases, more right than left lower lobe. Small bilateral pleural effusions.   Patient was placed on aggressive diuretic therapy with IV furosmide, metolazone , spironolactone , and acetazolamide , negative fluid balance was achieved, - 29,362 ml, with significant improvement in her symptoms.   Plan to continue acetazolamide , spironolactone  and  torsemide  for diuresis.  Continue metoprolol  succinate.   Holding on SGLT 2 inh due to body habitus and risk of urinary tract infections.  Continue midodrine  for blood pressure support.   Continue to encourage airway clearing techniques with flutter valve and incentive spirometer.   Acute on chronic hypoxemic and hypercapnic respiratory failure. Acute cardiogenic pulmonary edema.  Obesity hypoventilation syndrome  Bilateral lower lobe pneumonia, more right than left. Possible aspiration.  Completed antibiotic therapy with ceftriaxone  and azithromycin  + systemic corticosteroids.   Her 02 requirements continue to improve but not yet back to baseline, today she is on 11 L/min per Wilder with 02 saturation of 98%.   Continue with Bronchodilator therapy, inhaled corticosteroid. Revefenacin  ([place])   Continue respiratory support with intermittent and at bedtime bipap for respiratory support.  Patient will need further respiratory pulmonary rehab in order to improve further oxygenation.  She will be transferred to [facility]

## 2024-01-29 NOTE — Progress Notes (Signed)
 Patient has complaint that she no longer has xanax  on her med profile, which she received last night and she absolutely must have to manage her anxiety. Page on-call physician to request order for the same.

## 2024-01-29 NOTE — Assessment & Plan Note (Addendum)
 Calculated BMI is 53.3

## 2024-01-29 NOTE — Progress Notes (Signed)
 RT and RN x2 transported pt via Bipap from ED- 4E w/o any complications.

## 2024-01-29 NOTE — Assessment & Plan Note (Signed)
 Cell count has been stable.

## 2024-01-29 NOTE — Consult Note (Signed)
   NAME:  Chelsea [name], MRN:  [id number], DOB:  1966-06-19, LOS: 1 ADMISSION DATE:  01/28/2024, CONSULTATION DATE:  01/29/2024 REFERRING MD:  Lind Repine, MD, CHIEF COMPLAINT:  SOB   History of Present Illness:  58 y/o female with PMH for Morbid obesity, A fib, likely OHS with chronic respiratory failure on 5 lioters home O2, COPD, HTN, Hep C, DMT2 who presented with 4 days of worsening SOB.  No fever/chills.  She was placed on BiPAP in ED for PCO2 of 73 and her SOB.  The BiPAP seemed to help and she was also given Lasix  40mg , Rocephin  and Azithromycin .  Patient c/o LE and foot pain and normally takes Oxycodone  3x/day.    Pertinent  Medical History  orbid obesity, A fib, likely OHS with chronic respiratory failure on 5 lioters home O2, COPD, HTN, Hep C, DMT2  Significant Hospital Events: Including procedures, antibiotic start and stop dates in addition to other pertinent events   4/30: can be admitted to floor/stepdown  Interim History / Subjective:  This morning took break off bipap, placed on salter.    Objective   Blood pressure (!) 95/50, pulse 70, temperature 98.1 F (36.7 C), temperature source Axillary, resp. rate 17, height 5\' 4"  (1.626 m), weight (!) 141.1 kg, SpO2 94%.    Vent Mode: PSV;BIPAP FiO2 (%):  [50 %] 50 % PEEP:  [8 cmH20] 8 cmH20 Pressure Support:  [8 cmH20] 8 cmH20   Intake/Output Summary (Last 24 hours) at 01/29/2024 1017 Last data filed at 01/29/2024 0236 Gross per 24 hour  Intake 245.7 ml  Output --  Net 245.7 ml   Filed Weights   01/28/24 1553  Weight: (!) 141.1 kg    Examination: Morbidly obese, on nasal cannula No respiratory distress Breath sounds diminished, no wheeze RRR Bilateral lower exremity edema with venous stasis    Resolved Hospital Problem list   N/a  Assessment & Plan:  Acute on chronic Hypercapnic and hypoxic respiratory failure Acute on chronic HFpEF, moderate AS Atrial fibrillation Possible CAP - suspect secondary  to PAP nonadherence - Flu/Covid/RSV negative - continue Lasix , would add thiazide, suspect lower BNP in setting of obesity is underestimating her degree of volume overload. Defer to HF team. - started on empiric CAP coverage but given low pct, ocal consolidation, fevers, suspect this is less likely and would be ok with discontinuing abx. - has been seen by our practice several times in the last 6 months and tracheostomy has been recommended. - goal O2 saturations 88%   Rest per primary team. Discussed plan with RRT.   Louie Rover, MD Pulmonary and Critical Care Medicine [facility] 01/29/2024 10:35 AM Pager: see AMION  If no response to pager, please call critical care on call (see AMION) until 7pm After 7:00 pm call Elink

## 2024-01-29 NOTE — Assessment & Plan Note (Signed)
-   Pt diagnosed with CHF based on presence of the following: rales on exam, JVD, cardiomegaly, Pulmonary edema on CXR, and   bilateral leg edema, DOE,  pleural effusion   With noted response to IV diuretic in ER  admit on telemetry,  cycle cardiac enzymes, Cardiac Panel (last 3 results) Recent Labs    01/28/24 1623 01/28/24 1729 01/28/24 2059  CKTOTAL  --   --  58  TROPONINIHS 6 7  --      obtain serial ECG  to evaluate for ischemia as a cause of heart failure  monitor daily weight:  Filed Weights   01/28/24 1553  Weight: (!) 141.1 kg   Last BNP BNP (last 3 results) Recent Labs    11/10/23 0540 11/22/23 1607 01/28/24 1623  BNP 111.6* 348.6* 284.4*     diurese with IV lasix    40 mg IV BID    and monitor orthostatics and creatinine to avoid over diuresis.  Order echogram to evaluate EF and valves

## 2024-01-29 NOTE — Assessment & Plan Note (Addendum)
 Continue glucose cover and monitoring with insulin  sliding scale increase basal insulin  to 15 units bid Continue pre meal insulin  7 units.   Uncontrolled hyperglycemia, will increase dose of sliding scale.   Continue statin therapy.

## 2024-01-29 NOTE — Assessment & Plan Note (Signed)
 Continue levothyroxine

## 2024-01-29 NOTE — Consult Note (Signed)
 NAME:  Chelsea [name], MRN:  [id number], DOB:  01/04/1966, LOS: 1 ADMISSION DATE:  01/28/2024, CONSULTATION DATE:  01/29/2024 REFERRING MD:  Lind Repine, MD, CHIEF COMPLAINT:  SOB   History of Present Illness:  58 y/o female with PMH for Morbid obesity, A fib, likely OHS with chronic respiratory failure on 5 lioters home O2, COPD, HTN, Hep C, DMT2 who presented with 4 days of worsening SOB.  No fever/chills.  She was placed on BiPAP in ED for PCO2 of 73 and her SOB.  The BiPAP seemed to help and she was also given Lasix  40mg , Rocephin  and Azithromycin .  Patient c/o LE and foot pain and normally takes Oxycodone  3x/day.    Pertinent  Medical History  orbid obesity, A fib, likely OHS with chronic respiratory failure on 5 lioters home O2, COPD, HTN, Hep C, DMT2  Significant Hospital Events: Including procedures, antibiotic start and stop dates in addition to other pertinent events   4/30: can be admitted to floor/stepdown  Interim History / Subjective:  N/a  Objective   Blood pressure 108/63, pulse 73, temperature (!) 97.3 F (36.3 C), temperature source Oral, resp. rate (!) 21, height 5\' 4"  (1.626 m), weight (!) 141.1 kg, SpO2 95%.    Vent Mode: PSV;BIPAP FiO2 (%):  [50 %] 50 % PEEP:  [8 cmH20] 8 cmH20 Pressure Support:  [8 cmH20] 8 cmH20   Intake/Output Summary (Last 24 hours) at 01/29/2024 0145 Last data filed at 01/28/2024 1947 Gross per 24 hour  Intake 245.7 ml  Output --  Net 245.7 ml   Filed Weights   01/28/24 1553  Weight: (!) 141.1 kg    Examination: General: awake and alert on BiPAP but NAD HENT: no icterus, EOMI, PERRLA Lungs: No wheezes no rales, diminished bs b/l Cardiovascular: reg s1s2 no murmurs or gallops Abdomen: soft nt nd bs pos no guarding, obese Extremities: chronic LE changes, pos LE edema and reddness Neuro: AAOx3 CN II to XII grossly intact   Resolved Hospital Problem list   N/a  Assessment & Plan:  Acute on chronic Hypercapnic and  hypoxic respiratory failure Suggest to admit to Hospitalist service at this time Continue with BiPAP to drive CO2 down and improve WOB Consider Pulmonary consult Flu/Covid/RSV negative Lasix  given for edema seen on cxr Replace electrolytes as needed Outpatient Pulmonary f/u  Patient does not require ICU level of care at this time   Best Practice (right click and "Reselect all SmartList Selections" daily)   Per floor/Medicine protocol  Labs   CBC: Recent Labs  Lab 01/28/24 1623 01/28/24 2058  WBC 8.3  --   NEUTROABS 5.7  --   HGB 10.3* 11.2*  HCT 34.1* 33.0*  MCV 90.0  --   PLT 278  --     Basic Metabolic Panel: Recent Labs  Lab 01/28/24 1729 01/28/24 2058 01/28/24 2059  NA 137 138  --   K 3.2* 3.2*  --   CL 83*  --   --   CO2 40*  --   --   GLUCOSE 232*  --   --   BUN 40*  --   --   CREATININE 1.12*  --   --   CALCIUM  8.6*  --   --   MG  --   --  2.1  PHOS  --   --  5.8*   GFR: Estimated Creatinine Clearance: 75.3 mL/min (A) (by C-G formula based on SCr of 1.12 mg/dL (H)). Recent Labs  Lab  01/28/24 1623 01/28/24 2059  PROCALCITON  --  0.10  WBC 8.3  --   LATICACIDVEN  --  1.0    Liver Function Tests: Recent Labs  Lab 01/28/24 1729  AST 12*  ALT 13  ALKPHOS 95  BILITOT 0.7  PROT 7.4  ALBUMIN  2.8*   No results for input(s): "LIPASE", "AMYLASE" in the last 168 hours. Recent Labs  Lab 01/28/24 2059  AMMONIA 19    ABG    Component Value Date/Time   PHART 7.349 (L) 08/15/2023 1402   PCO2ART 96.6 (HH) 08/15/2023 1402   PO2ART 54 (L) 08/15/2023 1402   HCO3 46.7 (H) 01/28/2024 2058   TCO2 49 (H) 01/28/2024 2058   ACIDBASEDEF 7.9 (H) 09/20/2023 2156   O2SAT 79 01/28/2024 2058     Coagulation Profile: Recent Labs  Lab 01/28/24 2059  INR 1.5*    Cardiac Enzymes: Recent Labs  Lab 01/28/24 2059  CKTOTAL 58    HbA1C: Hgb A1c MFr Bld  Date/Time Value Ref Range Status  01/28/2024 04:23 PM 8.1 (H) 4.8 - 5.6 % Final    Comment:     (NOTE) Pre diabetes:          5.7%-6.4%  Diabetes:              >6.4%  Glycemic control for   <7.0% adults with diabetes   11/23/2023 03:05 AM 8.5 (H) 4.8 - 5.6 % Final    Comment:    (NOTE) Pre diabetes:          5.7%-6.4%  Diabetes:              >6.4%  Glycemic control for   <7.0% adults with diabetes     CBG: Recent Labs  Lab 01/28/24 2055 01/28/24 2321  GLUCAP 171* 153*    Review of Systems:   SOB  Past Medical History:  She,  has a past medical history of Acquired hypothyroidism (11/13/2010), Acute blood loss anemia (07/18/2022), Acute congestive heart failure (HCC) (03/18/2021), Acute hypoxemic respiratory failure (HCC) (11/03/2023), Acute hypoxic on chronic hypercapnic respiratory failure (HCC) (09/19/2023), Acute idiopathic gout of left hand (02/16/2019), Acute kidney injury (HCC) (05/29/2023), Acute metabolic encephalopathy (09/29/2023), Acute on chronic diastolic CHF (congestive heart failure), NYHA class 3 (HCC) (09/25/2017), Acute on chronic respiratory failure with hypoxia and hypercapnia (HCC) (04/07/2022), Arthritis, Asthma, Atrial flutter (HCC) (03/09/2021), Bleeding of the respiratory tract (04/07/2022), Bronchitis (05/30/2011), Cervical cancer (HCC) (2006), CHF exacerbation (HCC) (07/30/2022), Chronic heart failure with preserved ejection fraction (HCC) (02/23/2018), Chronic lower back pain, Chronic respiratory failure with hypoxia (HCC) (07/02/2018), Complication of anesthesia, COPD (chronic obstructive pulmonary disease) (HCC) (03/04/2015), Coronary artery disease, Cough productive of clear sputum (06/03/2023), Diarrhea (06/18/2019), DJD (degenerative joint disease) (07/03/2021), DM2 (diabetes mellitus, type 2) (HCC) (11/10/2010), Dyspnea (12/05/2017), Dyspnea on exertion (02/09/2021), Edema (03/22/2011), Essential (primary) hypertension (11/10/2010), Essential hypertension (11/10/2010), Gastro-esophageal reflux disease without esophagitis (11/10/2010), GERD  (11/10/2010), Hair loss (09/17/2019), HCAP (healthcare-associated pneumonia) (11/07/2023), Heart failure (HCC) (04/20/2021), Heart murmur, Hepatitis C, History of atrial fibrillation (09/04/2021), History of cervical cancer (03/22/2011), History of gout (X 1), History of tobacco use (04/20/2021), History of urinary retention (09/20/2023), atrial flutter (03/15/2021), Hyperglycemia due to type 2 diabetes mellitus (HCC) (04/20/2021), Hyperlipidemia, Hyperphosphatemia (06/04/2023), Hypertension associated with diabetes (HCC) (08/25/2018), Intertrigo (06/22/2023), Irritable bowel syndrome with diarrhea (06/18/2019), Limitation of activity due to disability (09/18/2021), Lymphedema of both lower extremities (03/22/2011), Metabolic alkalosis (03/28/2021), Microalbuminuria (04/20/2021), Migraine, Mild tricuspid regurgitation (04/02/2021), Mitral valve sclerosis (04/02/2021), Mixed hyperlipidemia (04/18/2020), Moderate aortic stenosis (  04/20/2021), Mononeuropathy of lower extremity (04/09/2011), Morbid (severe) obesity due to excess calories (HCC) (03/22/2011), Morbid obesity with BMI of 50.0-59.9, adult (HCC) (04/02/2021), Multifocal pneumonia (resolved - awaiting placement) (06/14/2023), Neuropathic pain, leg (04/09/2011), 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), Nocturnal hypoxemia (06/03/2020), Nocturnal hypoxemia due to obesity (11/23/2023), Non-smoker (04/02/2021), Obesity hypoventilation syndrome (HCC), Obstructive sleep apnea (11/10/2010), Obstructive sleep apnea (adult) (pediatric) (11/10/2010), On home oxygen  therapy, On supplemental oxygen  by nasal cannula (04/20/2021), Onychomycosis (06/18/2019), OSA treated with BiPAP, Osteoarthritis (11/28/2010), Other long term (current) drug therapy (02/12/2022), Pain of left middle finger (06/22/2023), Paroxysmal atrial fibrillation (HCC) (09/04/2021), Physical debility (05/10/2018), Physical deconditioning (04/24/2021), Pneumonia, Pneumonia due to  gram-positive bacteria (08/24/2017), Pressure injury of both heels, unstageable (HCC) (07/03/2021), Pulmonary edema, acute (HCC) (02/21/2022), Renal insufficiency (04/20/2021), Restless leg syndrome (03/22/2021), 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   Surgical History:   Past Surgical History:  Procedure Laterality Date   ABDOMINAL SURGERY  2006 X 2   "TAH incision was infected"   BIOPSY  07/21/2022   Procedure: BIOPSY;  Surgeon: Felecia Hopper, MD;  Location: [facility];  Service: Gastroenterology;;   CHOLECYSTECTOMY OPEN     ESOPHAGOGASTRODUODENOSCOPY N/A 07/21/2022   Procedure: ESOPHAGOGASTRODUODENOSCOPY (EGD);  Surgeon: Felecia Hopper, MD;  Location: [facility];  Service: Gastroenterology;  Laterality: N/A;   TONSILLECTOMY     TOTAL ABDOMINAL HYSTERECTOMY  2006   TRACHEOSTOMY REVISION N/A 04/09/2022   Procedure: CONTROL OF BLEEDING,TRACHEOSTOMY SITE;  Surgeon: Virgina Grills, MD;  Location: [facility] OR;  Service: ENT;  Laterality: N/A;     Social History:   reports that she has never smoked. She has never used smokeless tobacco. She reports that she does not currently use alcohol. She reports that she does not use drugs.   Family History:  Her family history includes Heart disease in her father.   Allergies Allergies  Allergen Reactions   Iodinated Contrast Media Anaphylaxis   Penicillins Anaphylaxis    Patient tolerates cefepime  (08/2017)   Insulin  Lispro Other (See Comments)    Adder per MAR from SNF   Minoxidil Hives     Home Medications  Prior to Admission medications   Medication Sig Start Date End Date Taking? Authorizing Provider  ALPRAZolam  (XANAX ) 0.5 MG  tablet Take 0.5 mg by mouth at bedtime. 01/19/24  Yes [provider]  amiodarone  (PACERONE ) 200 MG tablet Take 1 tablet (200 mg total) by mouth daily. 06/21/23  Yes Ernestina Headland, MD  atorvastatin  (LIPITOR) 20 MG tablet Take 20 mg by mouth every evening.   Yes [provider]  buPROPion  ER (WELLBUTRIN  SR) 100 MG 12 hr tablet Take 1 tablet (100 mg total) by mouth 2 (two) times daily. 06/08/23  Yes Ernestina Headland, MD  busPIRone  (BUSPAR ) 10 MG tablet Take 10 mg by mouth 3 (three) times daily. 01/16/24  Yes [provider]  Cholecalciferol  125 MCG (5000 UT) TABS Take 1 tablet (5,000 Units total) by mouth daily. Patient taking differently: Take 5,000 Units by mouth in the morning. 07/10/22  Yes [name], Estil Heman, MD  diclofenac  Sodium (VOLTAREN ) 1 % GEL Apply 2-4 g topically in the morning and at bedtime. Apply to bilateral knees/L shoulder topically twice a day for arthritis 4g dose lower extremities, 2g dose upper extremities   Yes [provider]  ELIQUIS  5 MG TABS tablet Take 5 mg by mouth 2 (two) times daily. 04/12/21  Yes [provider]  empagliflozin  (JARDIANCE ) 25 MG TABS tablet Take 1 tablet (25 mg total) by mouth daily. Patient taking differently: Take 25 mg by mouth in the morning. 08/20/23  Yes Ivin Marrow, MD  Fluticasone -Umeclidin-Vilant (TRELEGY ELLIPTA ) 100-62.5-25 MCG/ACT AEPB Inhale 1 puff into the lungs daily. 10/04/23  Yes Genora Kidd, MD  furosemide  (LASIX ) 10 MG/ML injection Inject 40 mg into the muscle once. For 2 days 01/27/24  Yes [provider]  insulin  glargine (LANTUS ) 100 UNIT/ML Solostar Pen Inject 15 Units into the skin in the morning. Patient taking differently: Inject 20 Units into the skin in the morning. 11/15/23  Yes Edison Gore, MD  ipratropium-albuterol  (DUONEB) 0.5-2.5 (3) MG/3ML SOLN Take 3 mLs by nebulization 3 (three) times daily. For 3 days for dyspnea   Yes [provider]  levothyroxine   (SYNTHROID ) 150 MCG tablet Take 1 tablet (150 mcg total) by mouth daily at 6 (six) AM. Patient taking differently: Take 150 mcg by mouth in the morning. 07/10/22  Yes [name], Estil Heman, MD  metolazone  (ZAROXOLYN ) 2.5 MG tablet Take 1 tablet (2.5 mg total) by mouth 3 (three) times a week. Patient taking differently: Take 5 mg by mouth daily. 11/27/23  Yes Jonne Netters, MD  metoprolol  succinate (TOPROL -XL) 50 MG 24 hr tablet Take 1 tablet (50 mg total) by mouth daily. Take with or immediately following a meal. Patient taking differently: Take 50 mg by mouth in the morning. Take with or immediately following a meal. 11/15/23  Yes Edison Gore, MD  montelukast  (SINGULAIR ) 10 MG tablet Take 10 mg by mouth at bedtime.   Yes [provider]  NON FORMULARY Apply BIPAP/CPAP every night at bedtime.   Yes [provider]  oxyCODONE -acetaminophen  (PERCOCET) 10-325 MG tablet Take 1 tablet by mouth 2 (two) times daily as needed for pain. 01/05/24  Yes [provider]  OXYGEN  Inhale 5 L/min into the lungs at bedtime. Connect to [employer] every night   Yes [provider]  Potassium Chloride  ER 20 MEQ TBCR Take 2 tablets by mouth every 12 (twelve) hours. 11/16/23  Yes [provider]  rOPINIRole  (REQUIP ) 0.5 MG tablet Take 1 tablet (0.5 mg total) by mouth at bedtime. 06/21/23  Yes Ernestina Headland, MD  spironolactone  (ALDACTONE ) 25 MG tablet Take 25 mg by mouth daily. 01/27/24  Yes [provider]  tirzepatide Florence Hunt) 2.5 MG/0.5ML Pen Inject 2.5 mg into the skin once a week. Patient taking differently: Inject 5 mg into the skin every Wednesday. 11/26/23  Yes Jonne Netters, MD  torsemide  40 MG TABS Take 80 mg by mouth 2 (two) times daily. 11/26/23  Yes Jonne Netters, MD     Critical care time: 45   The patient is critically ill with multiple organ system failure and requires high complexity decision making for assessment and support, frequent evaluation  and titration of therapies, advanced monitoring, review of radiographic studies and interpretation of complex data.   Critical Care Time devoted to patient care services, exclusive of separately billable procedures, described in this note is 31 minutes.   Claven Cumming, MD [place] [facility] See Amion for pager  If no response to pager , please call [phone] until 7pm After 7:00 pm call Elink  [phone] 01/29/2024, 1:45 AM

## 2024-01-29 NOTE — Hospital Course (Addendum)
 Chelsea [name] was admitted to the hospital with the working diagnosis of acute on chronic hypercapnic and hypoxemic respiratory failure.   58 yo female with the past medical history of T2DM, obesity hypoventilation syndrome, hypertension, COPD and obesity class 3 who presented with dyspnea.  Reported worsening symptoms for the last 4 days. Noted recently weaning off alprazolam .  On her initial physical examination her blood pressure was 127/57, HR 73, RR 18 and 02 saturation 85%  Lungs with no wheezing, positive bilateral rales, heart with S1 and S2 present and regular, abdomen with no distention and no lower extremity edema.   VBG 7,41/73.3/ 46/ 46.7/ 79%  Na 137 K 3,2 Cl 83 bicarbonate 40 glucose 232 bun 40 cr 1,12 AST 12 ALT 13  BNP 284  High sensitive troponin 6 and 7  Wbc 8,3 hgb 10.3 plt 278  Sars COVID 19 negative  RSV negative  Influenza negative Respiratory viral panel negative   Urine analysis SG 1,006, protein negative, leukocytes and hgb negative   Chest radiograph with cardiomegaly with bilateral hilar vascular congestion, bilateral central interstitial infiltrates, with no effusions.   EKG 70 bpm, normal axis, normal intervals qtc 474, sinus rhythm with no significant ST segment changes, negative T wave V1 to V3.   Patient placed on high doses of diuretics for volume overload.  05/02 continue to have high 02 requirements per [place] high flow.  05/03 continue diuresing, slow wean of supplemental 02  05/04 worsening oxygenation, this morning on non re-breather and high flow nasal cannula despite diuresis. CT chest with signs of aspiration pneumonia, started on antibiotic therapy.  05/05 improved work of breathing. Continue diuresis.  05/06 patient uncomfortable on Bipap, and transitioned to heated  high flow nasal cannula.  05/16 improved oxygenation, today using 5 L.min per [place]. She has been transitioned to oral loop diuretic.  05/18 stable oxygenation, possible transfer to  SNF on Tuesday.  05/20 continue to have 02 requirements more than her baseline 5 L/min, using up to 11 L/min.  She will be transferred to [facility] to continue wean off 02 and continue physical therapy.

## 2024-01-29 NOTE — Progress Notes (Signed)
 Echocardiogram 2D Echocardiogram has been performed.  Chelsea [name] 01/29/2024, 12:32 PM

## 2024-01-30 DIAGNOSIS — E119 Type 2 diabetes mellitus without complications: Secondary | ICD-10-CM | POA: Diagnosis not present

## 2024-01-30 DIAGNOSIS — J9621 Acute and chronic respiratory failure with hypoxia: Secondary | ICD-10-CM | POA: Diagnosis not present

## 2024-01-30 DIAGNOSIS — J9622 Acute and chronic respiratory failure with hypercapnia: Secondary | ICD-10-CM | POA: Diagnosis not present

## 2024-01-30 DIAGNOSIS — I5033 Acute on chronic diastolic (congestive) heart failure: Secondary | ICD-10-CM | POA: Diagnosis not present

## 2024-01-30 DIAGNOSIS — E876 Hypokalemia: Secondary | ICD-10-CM | POA: Diagnosis not present

## 2024-01-30 DIAGNOSIS — I4892 Unspecified atrial flutter: Secondary | ICD-10-CM | POA: Diagnosis not present

## 2024-01-30 DIAGNOSIS — F419 Anxiety disorder, unspecified: Secondary | ICD-10-CM

## 2024-01-30 LAB — GLUCOSE, CAPILLARY
Glucose-Capillary: 145 mg/dL — ABNORMAL HIGH (ref 70–99)
Glucose-Capillary: 148 mg/dL — ABNORMAL HIGH (ref 70–99)
Glucose-Capillary: 202 mg/dL — ABNORMAL HIGH (ref 70–99)
Glucose-Capillary: 216 mg/dL — ABNORMAL HIGH (ref 70–99)
Glucose-Capillary: 205 mg/dL — ABNORMAL HIGH (ref 70–99)

## 2024-01-30 LAB — BLOOD GAS, VENOUS
Acid-Base Excess: 25.1 mmol/L — ABNORMAL HIGH (ref 0.0–2.0)
Bicarbonate: 53.6 mmol/L — ABNORMAL HIGH (ref 20.0–28.0)
O2 Saturation: 97.1 %
Patient temperature: 36.8
pCO2, Ven: 71 mmHg (ref 44–60)
pH, Ven: 7.48 — ABNORMAL HIGH (ref 7.25–7.43)
pO2, Ven: 90 mmHg — ABNORMAL HIGH (ref 32–45)

## 2024-01-30 LAB — BASIC METABOLIC PANEL WITH GFR
Anion gap: 13 (ref 5–15)
BUN: 39 mg/dL — ABNORMAL HIGH (ref 6–20)
CO2: 44 mmol/L — ABNORMAL HIGH (ref 22–32)
Calcium: 8.7 mg/dL — ABNORMAL LOW (ref 8.9–10.3)
Chloride: 81 mmol/L — ABNORMAL LOW (ref 98–111)
Creatinine, Ser: 1.38 mg/dL — ABNORMAL HIGH (ref 0.44–1.00)
GFR, Estimated: 44 mL/min — ABNORMAL LOW (ref >60)
Glucose, Bld: 248 mg/dL — ABNORMAL HIGH (ref 70–99)
Potassium: 3.4 mmol/L — ABNORMAL LOW (ref 3.5–5.1)
Sodium: 138 mmol/L (ref 135–145)

## 2024-01-30 MED ORDER — METOLAZONE 5 MG PO TABS
2.5000 mg | ORAL_TABLET | Freq: Once | ORAL | Status: AC
  Administered 2024-01-30: 2.5 mg via ORAL
  Filled 2024-01-30: qty 1

## 2024-01-30 MED ORDER — INSULIN ASPART 100 UNIT/ML IJ SOLN
0.0000 [IU] | Freq: Three times a day (TID) | INTRAMUSCULAR | Status: DC
  Administered 2024-01-30: 3 [IU] via SUBCUTANEOUS
  Administered 2024-01-31 (×2): 2 [IU] via SUBCUTANEOUS
  Administered 2024-01-31 (×2): 3 [IU] via SUBCUTANEOUS
  Administered 2024-02-01: 5 [IU] via SUBCUTANEOUS
  Administered 2024-02-01: 2 [IU] via SUBCUTANEOUS
  Administered 2024-02-01 (×2): 3 [IU] via SUBCUTANEOUS
  Administered 2024-02-02: 5 [IU] via SUBCUTANEOUS
  Administered 2024-02-02: 2 [IU] via SUBCUTANEOUS
  Administered 2024-02-03: 5 [IU] via SUBCUTANEOUS
  Administered 2024-02-03 – 2024-02-04 (×3): 3 [IU] via SUBCUTANEOUS
  Administered 2024-02-04: 2 [IU] via SUBCUTANEOUS

## 2024-01-30 MED ORDER — ALPRAZOLAM 0.25 MG PO TABS
0.2500 mg | ORAL_TABLET | Freq: Once | ORAL | Status: AC
  Administered 2024-01-30: 0.25 mg via ORAL
  Filled 2024-01-30: qty 1

## 2024-01-30 MED ORDER — FUROSEMIDE 10 MG/ML IJ SOLN
60.0000 mg | Freq: Three times a day (TID) | INTRAMUSCULAR | Status: DC
  Administered 2024-01-30 – 2024-02-13 (×39): 60 mg via INTRAVENOUS
  Filled 2024-01-30 (×41): qty 6

## 2024-01-30 MED ORDER — ALPRAZOLAM 0.25 MG PO TABS
0.2500 mg | ORAL_TABLET | Freq: Three times a day (TID) | ORAL | Status: DC | PRN
  Administered 2024-01-30 – 2024-02-18 (×30): 0.25 mg via ORAL
  Filled 2024-01-30 (×32): qty 1

## 2024-01-30 MED ORDER — ALPRAZOLAM 0.5 MG PO TABS: 0.5000 mg | ORAL_TABLET | Freq: Three times a day (TID) | ORAL | Status: DC | PRN

## 2024-01-30 NOTE — Plan of Care (Signed)

## 2024-01-30 NOTE — Progress Notes (Signed)
Heart Failure Navigator Progress Note  Assessed for Heart & Vascular TOC clinic readiness.  Patient does not meet criteria due to EF 60-65%, No HF TOC per Dr. Ella Jubilee. .   Navigator will sign off at this time.   Rhae Hammock, BSN, Scientist, clinical (histocompatibility and immunogenetics) Only

## 2024-01-30 NOTE — Progress Notes (Signed)
 Pt. States she will notify when she is ready for her bipap.

## 2024-01-30 NOTE — Evaluation (Addendum)
 Occupational Therapy Evaluation Patient Details Name: Chelsea [name] MRN: [id number] DOB: 11-Dec-1965 Today's Date: 01/30/2024   History of Present Illness   Pt is a 58 y.o. female presenting 4/29 with increased shortness of breath. Admitted for Acute on chronic hypoxic and hypercarbic respiratory failure , acute on chronic diastolic CHF exacerbation. PMH significant for DMII, morbid obesity, obesity hypoventilation syndrome, heart failure, chronic respiratory failure on 5L baseline and BiPAP at night, HTN, hep C, COPD, afib, CHF.     Clinical Impressions Pt reported at PLOF mainly bed level and has been over a month that they have been OOB. When they were getting OOB they were using a hoyer lift and wc level. She would have ADLS for hygiene completed at bed level. Pt requires max -total assist for all Adls as limited to anxiety to complete tasks. Pt at this time once back to [facility] to attempt with therapy. Acute Occupational Therapy singing off. Please reconsult if needed.      If plan is discharge home, recommend the following:   Two people to help with walking and/or transfers;Two people to help with bathing/dressing/bathroom;Assistance with cooking/housework;Direct supervision/assist for medications management;Direct supervision/assist for financial management;Assist for transportation;Help with stairs or ramp for entrance     Functional Status Assessment   Patient has had a recent decline in their functional status and demonstrates the ability to make significant improvements in function in a reasonable and predictable amount of time.     Equipment Recommendations   None recommended by OT     Recommendations for Other Services         Precautions/Restrictions   Precautions Precautions: Fall Restrictions Weight Bearing Restrictions Per Provider Order: No     Mobility Bed Mobility Overal bed mobility: Needs Assistance Bed Mobility:  Rolling Rolling: Total assist, +2 for physical assistance, +2 for safety/equipment         General bed mobility comments: per pt reported when pt placed bed pan total assist x2 person assist    Transfers                   General transfer comment: deffered      Balance                                           ADL either performed or assessed with clinical judgement   ADL Overall ADL's : Needs assistance/impaired Eating/Feeding: Set up;Sitting;Bed level   Grooming: Wash/dry face;Set up;Bed level   Upper Body Bathing: Moderate assistance;Maximal assistance;Bed level       Upper Body Dressing : Moderate assistance;Bed level           Toileting- Clothing Manipulation and Hygiene: Total assistance;+2 for physical assistance;+2 for safety/equipment;Bed level         General ADL Comments: pt decline all bed mobility     Vision Baseline Vision/History: 0 No visual deficits Ability to See in Adequate Light: 0 Adequate Patient Visual Report: No change from baseline Vision Assessment?: No apparent visual deficits     Perception         Praxis         Pertinent Vitals/Pain Pain Assessment Pain Assessment: Faces Faces Pain Scale: Hurts whole lot Pain Location: general Pain Descriptors / Indicators: Aching Pain Intervention(s): Limited activity within patient's tolerance, Monitored during session, Patient requesting pain meds-RN notified (however, can not have any more  pain meds at this time)     Extremity/Trunk Assessment Upper Extremity Assessment Upper Extremity Assessment: Generalized weakness;Right hand dominant;RUE deficits/detail;LUE deficits/detail RUE Deficits / Details: WFL but just weakness RUE Sensation: WNL RUE Coordination: WNL LUE Deficits / Details: Pt reporting they can not do anything but when reaching for phone was able to show increase in shoulder flexion to objects   Lower Extremity Assessment Lower Extremity  Assessment: Defer to PT evaluation       Communication Communication Communication: No apparent difficulties   Cognition Arousal: Alert Behavior During Therapy: Restless Cognition: No apparent impairments             OT - Cognition Comments: sometimes slow to answer questions due to anxiety                 Following commands: Intact       Cueing  General Comments   Cueing Techniques: Verbal cues      Exercises     Shoulder Instructions      Home Living Family/patient expects to be discharged to:: Skilled nursing facility                                 Additional Comments: Has been in and  out of [facility] and having assist with all ADLS. only ben getting to sitting to the EOB for a month      Prior Functioning/Environment Prior Level of Function : Needs assist       Physical Assist : Mobility (physical);ADLs (physical) Mobility (physical): Bed mobility;Transfers   Mobility Comments: When pt was getting OOB they used a hoyer lift OOB to w/c. Then was only getting OOB to w/c 2-3x/week. In the last month they have were only getting to the EOB. ADLs Comments: Pt reported they were doing some thing forthemelf at bed level with UE but mostly having assist.    OT Problem List: Pain;Decreased strength;Decreased activity tolerance;Impaired balance (sitting and/or standing);Decreased knowledge of use of DME or AE;Cardiopulmonary status limiting activity   OT Treatment/Interventions:        OT Goals(Current goals can be found in the care plan section)   Acute Rehab OT Goals Patient Stated Goal: none OT Goal Formulation: With patient Time For Goal Achievement: 02/13/24 Potential to Achieve Goals: Poor   OT Frequency:       Co-evaluation              AM-PAC OT "6 Clicks" Daily Activity     Outcome Measure Help from another person eating meals?: A Little Help from another person taking care of personal grooming?: A  Lot Help from another person toileting, which includes using toliet, bedpan, or urinal?: Total Help from another person bathing (including washing, rinsing, drying)?: A Lot Help from another person to put on and taking off regular upper body clothing?: A Lot Help from another person to put on and taking off regular lower body clothing?: Total 6 Click Score: 11   End of Session Nurse Communication: Mobility status  Activity Tolerance: Other (comment);Patient limited by pain (anxiety) Patient left: in bed;with call bell/phone within reach;with bed alarm set;with nursing/sitter in room  OT Visit Diagnosis: Pain;Other abnormalities of gait and mobility (R26.89);Muscle weakness (generalized) (M62.81) Pain - part of body:  (general)                Time: 1610-9604 OT Time Calculation (min): 16 min Charges:  OT General  Charges $OT Visit: 1 Visit OT Evaluation $OT Eval Moderate Complexity: 1 Mod  Erving Heather OTR/L  Acute Rehab Services  [phone] office number   Stevphen Elders 01/30/2024, 11:23 AM

## 2024-01-30 NOTE — TOC Initial Note (Signed)
 Transition of Care [facility]) - Initial/Assessment Note    Patient Details  Name: Chelsea [name] MRN: [id number] Date of Birth: Nov 28, 1965  Transition [facility] CM/SW Contact:    Valery Gaucher, LCSW Phone Number: 01/30/2024, 2:02 PM  Clinical Narrative:                  CSW met with patient at bedside. CSW introduced self and explained role. Patient confirmed she is form [facility], LTC and is expected to return once stable.   TOC will continue to follow and assist with discharge planning.  Liddie Reel, MSW, LCSW Clinical Social Worker    Expected Discharge Plan: Skilled Nursing Facility Barriers to Discharge: Continued Medical Work up   Patient Goals and CMS Choice            Expected Discharge Plan and Services In-house Referral: Clinical Social Work     Living arrangements for the past 2 months: Skilled Nursing Facility                                      Prior Living Arrangements/Services Living arrangements for the past 2 months: Skilled Nursing Facility Lives with:: Facility Resident                   Activities of Daily Living      Permission Sought/Granted                  Emotional Assessment       Orientation: : Oriented to Self, Oriented to Place, Oriented to  Time, Oriented to Situation Alcohol / Substance Use: Not Applicable Psych Involvement: No (comment)  Admission diagnosis:  Acute on chronic diastolic CHF (congestive heart failure) (HCC) [I50.33] Pneumonia of both lungs due to infectious organism, unspecified part of lung [J18.9] Acute on chronic hypoxic respiratory failure (HCC) [J96.21] Patient Active Problem List   Diagnosis Date Noted   Obesity, class 3 01/29/2024   Acute on chronic diastolic CHF (congestive heart failure) (HCC) 01/28/2024   Nocturnal hypoxemia due to obesity 11/23/2023   Atrial fibrillation with RVR (HCC) 11/09/2023   H1N1 influenza 11/07/2023   Acute on chronic  hypoxic respiratory failure (HCC) 11/04/2023   Abnormal CT scan of lung 09/22/2023   History of urinary retention 09/20/2023   Type 2 diabetes mellitus with diabetic neuropathy, unspecified (HCC) 08/16/2023   Hypokalemia 06/15/2023   Anxiety 05/30/2023   Atrial flutter with rapid ventricular response (HCC) 05/29/2023   Arthritis 11/13/2022   Asthma 11/13/2022   Chronic lower back pain 11/13/2022   Complication of anesthesia 11/13/2022   Coronary artery disease 11/13/2022   (HFpEF) heart failure with preserved ejection fraction (HCC) 11/13/2022   Migraine 11/13/2022   On home oxygen  therapy 11/13/2022   Chronic iron  deficiency anemia 09/18/2022   Acute on chronic respiratory failure with hypoxia (HCC) 09/17/2022   Chronic disease anemia 08/01/2022   COPD (chronic obstructive pulmonary disease) with chronic bronchitis (HCC) 07/18/2022   Chronic respiratory failure with hypercapnia (HCC) 02/12/2022   Restrictive lung disease secondary to obesity 01/28/2022   Limitation of activity due to disability 09/18/2021   DJD (degenerative joint disease) 07/03/2021   Pressure injury of both heels, unstageable (HCC) 07/03/2021   Venous stasis dermatitis of both lower extremities 07/02/2021   Depression 04/20/2021   History of tobacco use 04/20/2021   Irritable bowel syndrome with constipation 04/20/2021  Microalbuminuria 04/20/2021   Moderate aortic stenosis 04/20/2021   Awaiting admission to adequate facility elsewhere 03/28/2021   Restless leg syndrome 03/22/2021   Atrial flutter (HCC) 03/09/2021   Pulmonary nodule less than 6 cm determined by computed tomography of lung 02/09/2021   Nocturnal hypoxemia 06/03/2020   Hair loss 09/17/2019   Chronic pain of multiple joints 06/18/2019   Onychomycosis 06/18/2019   Hypertension associated with diabetes (HCC) 08/25/2018   Class 3 severe obesity due to excess calories with serious comorbidity and body mass index (BMI) of 60.0 to 69.9 in adult  (HCC) 08/25/2018   Hepatitis C    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   OSA on CPAP 02/24/2018   Hyperlipidemia associated with type 2 diabetes mellitus (HCC) 09/28/2015   Mononeuropathy of lower extremity 04/09/2011   Lymphedema 03/22/2011   Obesity hypoventilation syndrome (HCC) 01/01/2011   Acute on chronic diastolic heart failure (HCC) 11/28/2010   Osteoarthritis 11/28/2010   Hypothyroidism 11/13/2010   GERD 11/10/2010   Type 2 diabetes mellitus without complication (HCC) 11/10/2010   PCP:  Pcp, No Pharmacy:   [facility]leda [facility], [place] - [address] [place] [address] Phone: [phone] Fax: [phone]  [facility] [address] [place] [address] Phone: [phone] Fax: [phone]     Social Drivers of Health (SDOH) Social History: SDOH Screenings   Food Insecurity: Patient Declined (01/29/2024)  Housing: Unknown (01/29/2024)  Transportation Needs: Patient Declined (01/29/2024)  Utilities: Patient Declined (01/29/2024)  Alcohol Screen: Low Risk  (07/05/2022)  Financial Resource Strain: Low Risk  (07/05/2022)  Physical Activity: Insufficiently Active (03/27/2021)   Received from [facility], [facility]  Social Connections: Unknown (02/04/2022)   Received from [facility], [facility]  Stress: No Stress Concern Present (09/15/2021)   Received from [facility], [facility]  Tobacco Use: Low Risk  (01/28/2024)   SDOH Interventions:     Readmission Risk Interventions    11/15/2023    4:20 PM 07/23/2022    2:19 PM 04/09/2022    4:17 PM  Readmission Risk Prevention Plan  Transportation Screening Complete Complete Complete  PCP or Specialist Appt within 3-5 Days   Complete  HRI or Home Care Consult   Complete  Social Work Consult for Recovery Care Planning/Counseling   Complete  Palliative Care Screening   Not Applicable  Medication Review [employer] conservator/restorer) Complete Complete Referral to Pharmacy  PCP or Specialist appointment within 3-5 days of discharge  Complete   HRI or Home Care Consult  Complete   SW Recovery Care/Counseling Consult  Complete   Palliative Care Screening Not Applicable Not Applicable   Skilled Nursing Facility Complete Complete

## 2024-01-30 NOTE — Assessment & Plan Note (Addendum)
 Continue with as needed alprazolam .  Continue with bupropion .  Acute on chronic back pain, continue with as needed hydrocodone .

## 2024-01-30 NOTE — Progress Notes (Signed)
   NAME:  Chelsea [name], MRN:  [id number], DOB:  Oct 01, 1966, LOS: 2 ADMISSION DATE:  01/28/2024, CONSULTATION DATE:  01/29/2024 REFERRING MD:  Lind Repine, MD, CHIEF COMPLAINT:  SOB   History of Present Illness:  58 y/o female with PMH for Morbid obesity, A fib, likely OHS with chronic respiratory failure on 5 lioters home O2, COPD, HTN, Hep C, DMT2 who presented with 4 days of worsening SOB.  No fever/chills.  She was placed on BiPAP in ED for PCO2 of 73 and her SOB.  The BiPAP seemed to help and she was also given Lasix  40mg , Rocephin  and Azithromycin .  Patient c/o LE and foot pain and normally takes Oxycodone  3x/day.    Pertinent  Medical History  orbid obesity, A fib, likely OHS with chronic respiratory failure on 5 lioters home O2, COPD, HTN, Hep C, DMT2  Significant Hospital Events: Including procedures, antibiotic start and stop dates in addition to other pertinent events   4/30: can be admitted to floor/stepdown  Interim History / Subjective:  Resting comfortably, not on bipap currently. Has been wearing at night. Says she feels anxious. Sometimes says her breathing is ok. Does feel better on bipap. Objective   Blood pressure 104/64, pulse 72, temperature 97.6 F (36.4 C), temperature source Oral, resp. rate (!) 27, height 5\' 4"  (1.626 m), weight (!) 141.1 kg, SpO2 93%.    Vent Mode: BIPAP FiO2 (%):  [50 %] 50 %   Intake/Output Summary (Last 24 hours) at 01/30/2024 1421 Last data filed at 01/30/2024 1354 Gross per 24 hour  Intake 960 ml  Output 2950 ml  Net -1990 ml   Filed Weights   01/28/24 1553  Weight: (!) 141.1 kg    Examination: Morbidly obese, laying flat on nasal cannula Breath sounds diminished Heart sounds diminished Obese, soft abdomen Bilateral edema and venous stasis  Cr seems to be improving with diuresis Hypokalemia  Resolved Hospital Problem list   N/a  Assessment & Plan:  Acute on chronic Hypercapnic and hypoxic respiratory failure,  baseline 5LNC Acute on chronic HFpEF, moderate AS Atrial fibrillation Possible CAP - suspect secondary to PAP nonadherence and decompensated HFPEF - continue lasix , but I will increase to tid. Continue aldactone  - has been seen by our practice several times in the last 6 months and tracheostomy has been recommended. - goal O2 saturations 88%  - hopefully we can improve her breathing with diuresis.  - she says she feels anxious about her general health. - she already has home bipap set up but needs f/u with our office for sleep as she approaches discharge.   Rest per primary team Louie Rover, MD Pulmonary and Critical Care Medicine [facility] 01/30/2024 2:21 PM Pager: see AMION  If no response to pager, please call critical care on call (see AMION) until 7pm After 7:00 pm call Elink

## 2024-01-30 NOTE — Evaluation (Signed)
 Physical Therapy Evaluation Patient Details Name: Chelsea [name] MRN: [id number] DOB: Nov 26, 1965 Today's Date: 01/30/2024  History of Present Illness  Pt is a 58 y.o. female presenting 4/29 with increased shortness of breath. Admitted for Acute on chronic hypoxic and hypercarbic respiratory failure , acute on chronic diastolic CHF exacerbation. PMH significant for DMII, morbid obesity, obesity hypoventilation syndrome, heart failure, chronic respiratory failure on 5L baseline and BiPAP at night, HTN, hep C, COPD, afib, CHF.  Clinical Impression  Pt is at baseline level of functioning. Pt demonstrates relatively good strength in bil UE and LE with movement. Pt is able to perform SLR on R and L with limited ROM with good movement and ability to resist light pressure. Pt is self limiting and has been receiving total assist for mobility and ADL's. Pt is not appropriate for skilled physical therapy services at this time due to lack of resolve to participate. Pt weighs anxiety/pain greater than need for mobility despite education on the importance of mobility. Pt strength demonstrates an ability to participate physically more with her surroundings that she has previously but due to pain/respiratory status and anxiety has not been able to overcome these deficits to participate. Pt will be discharged at this time. Please re-consult when pt is able and willing to participate in skilled physical therapy services which will benefit her in becoming more independent.         If plan is discharge home, recommend the following: Two people to help with walking and/or transfers;Assist for transportation;Assistance with cooking/housework     Equipment Recommendations None recommended by PT     Functional Status Assessment Patient has not had a recent decline in their functional status     Precautions / Restrictions Precautions Precautions: Fall Restrictions Weight Bearing Restrictions Per Provider Order: No       Mobility  Bed Mobility Overal bed mobility: Needs Assistance Bed Mobility: Rolling Rolling: Total assist, +2 for physical assistance, +2 for safety/equipment         General bed mobility comments: per pt reported when pt placed bed pan total assist x2 person assist. Pt declined further mobility    Transfers     General transfer comment: Pt declined          Pertinent Vitals/Pain Pain Assessment Pain Assessment: Faces Faces Pain Scale: Hurts whole lot Pain Location: general Pain Descriptors / Indicators: Aching Pain Intervention(s): Monitored during session, Limited activity within patient's tolerance, Patient requesting pain meds-RN notified    Home Living Family/patient expects to be discharged to:: Skilled nursing facility       Additional Comments: Has been in and  out of [facility] and having assist with all ADLS. only ben getting to sitting to the EOB for a month    Prior Function Prior Level of Function : Needs assist       Physical Assist : Mobility (physical);ADLs (physical) Mobility (physical): Bed mobility;Transfers   Mobility Comments: When pt was getting OOB they used a hoyer lift OOB to w/c. Then was only getting OOB to w/c 2-3x/week. In the last month they have were only getting to the EOB. ADLs Comments: Pt reported they were doing somethings for themelf at bed level with UE but mostly having assist.     Extremity/Trunk Assessment   Upper Extremity Assessment Upper Extremity Assessment: Defer to OT evaluation RUE Deficits / Details: WFL but just weakness RUE Sensation: WNL RUE Coordination: WNL LUE Deficits / Details: Pt reporting they can not do anything but  when reaching for phone was able to show increase in shoulder flexion to objects    Lower Extremity Assessment Lower Extremity Assessment: Generalized weakness (Pt has 4-/5 strength in bil LE hips/knees and ankles)    Cervical / Trunk Assessment Cervical / Trunk  Assessment: Normal  Communication   Communication Communication: No apparent difficulties    Cognition Arousal: Alert Behavior During Therapy: Restless, Agitated, Anxious   Following commands: Intact       Cueing Cueing Techniques: Verbal cues     General Comments General comments (skin integrity, edema, etc.): No noted skin issues on face/neck. Pt was resistant to movement. Stated she wanted anxiety and pain meds but would try to do what she could. When asked to roll in bed pt states she cannot        Assessment/Plan    PT Assessment Patient does not need any further PT services         PT Goals (Current goals can be found in the Care Plan section)  Acute Rehab PT Goals PT Goal Formulation: All assessment and education complete, DC therapy         Co-evaluation PT/OT/SLP Co-Evaluation/Treatment: Yes Reason for Co-Treatment: Complexity of the patient's impairments (multi-system involvement);Necessary to address cognition/behavior during functional activity;For patient/therapist safety;To address functional/ADL transfers PT goals addressed during session: Other (comment);Strengthening/ROM (education/assessing pt ROM/strength in LE)         AM-PAC PT "6 Clicks" Mobility  Outcome Measure Help needed turning from your back to your side while in a flat bed without using bedrails?: Total Help needed moving from lying on your back to sitting on the side of a flat bed without using bedrails?: Total Help needed moving to and from a bed to a chair (including a wheelchair)?: Total Help needed standing up from a chair using your arms (e.g., wheelchair or bedside chair)?: Total Help needed to walk in hospital room?: Total Help needed climbing 3-5 steps with a railing? : Total 6 Click Score: 6    End of Session Equipment Utilized During Treatment: Oxygen  Activity Tolerance: Patient limited by pain;Other (comment) (Pt is self limiting) Patient left: in bed;with bed alarm  set;with call bell/phone within reach;with nursing/sitter in room Nurse Communication: Mobility status;Need for lift equipment      Time: 1100-1111 PT Time Calculation (min) (ACUTE ONLY): 11 min   Charges:   PT Evaluation $PT Eval Low Complexity: 1 Low   PT General Charges $$ ACUTE PT VISIT: 1 Visit        Sloan Duncans, DPT, CLT  Acute Rehabilitation Services Office: [phone] (Secure chat preferred)   Jenice Mitts 01/30/2024, 2:02 PM

## 2024-01-30 NOTE — Progress Notes (Signed)
 Progress Note   Patient: Chelsea [name] RUE:[id number] DOB: 1966/04/03 DOA: 01/28/2024     2 DOS: the patient was seen and examined on 01/30/2024   Brief hospital course: Mrs. Valero was admitted to the hospital with the working diagnosis of acute on chronic hypercapnic and hypoxemic respiratory failure.   58 yo female with the past medical history of T2DM, obesity hypoventilation syndrome, hypertension, COPD and obesity class 3 who presented with dyspnea.  Reported worsening symptoms for the last 4 days. Noted recently weaning off alprazolam .  On her initial physical examination her blood pressure was 127/57, HR 73, RR 18 and 02 saturation 85%  Lungs with no wheezing, positive bilateral rales, heart with S1 and S2 present and regular, abdomen with no distention and no lower extremity edema.   Chest radiograph with cardiomegaly with bilateral hilar vascular congestion, bilateral central interstitial infiltrates, with no effusions.   EKG 70 bpm, normal axis, normal intervals qtc 474, sinus rhythm with no significant ST segment changes, negative T wave V1 to V3.   Assessment and Plan: * Acute on chronic diastolic CHF (congestive heart failure) (HCC) Echocardiogram with preserved LV systolic function with EF 60 to 65%, RV systolic function preserved, moderate valve aortic stenosis, RVSP 26,6 mmHg, LA and RA not enlarged, no pericardial effusion.   Urine output 2,100  Systolic blood pressure 100 mmHg range  Continue volume overloaded.   Continue furosemide  60 mg IV tid.  Continue metoprolol  and spironolactone   Will hold on SGLT 2 inh due to body habitus and risk of urinary tract infections.  Will add one dose of metolazone  today 2,5 mg   Acute on chronic hypoxemic and hypercapnic respiratory failure. Acute cardiogenic pulmonary edema.  Obesity hypoventilation syndrome  Plan to continue diuresis, biapap for sleep and as needed during the day.  ABG 7,48/ 71/ 90/25 02 saturation 97%    Atrial flutter with rapid ventricular response (HCC) Continue rate control with metoprolol / amiodarone  and anticoagulation with apixaban .  Continue telemetry monitoring.   Hypokalemia Pending renal function and electrolytes today.  Keep K at 4 and Mg at 2.  Continue to monitor urine output   Type 2 diabetes mellitus without complication (HCC) Continue glucose cover and monitoring with insulin  sliding scale Continue basal insulin  Patient is tolerating po well, her fasting glucose today is 148 mg/dl.   Chronic iron  deficiency anemia Cell count has been stable   Hypothyroidism Continue levothyroxine    Obesity, class 3 Calculated BMI is 53.3   Anxiety Added as needed alprazolam .       Subjective: Patient continue with dyspnea and today having anxiety, no chest pain, no nausea or vomiting.   Physical Exam: Vitals:   01/30/24 0436 01/30/24 0812 01/30/24 0818 01/30/24 1142  BP:  107/78 107/78 104/64  Pulse: 69 71 70 72  Resp: 17 18 17  (!) 27  Temp:   97.9 F (36.6 C) 97.6 F (36.4 C)  TempSrc:   Oral Oral  SpO2: 92% 94% 93% 93%  Weight:      Height:       Neurology awake and alert, deconditioned and ill looking appearing  ENT with mild pallor Cardiovascular with S1 and S2 present and regular, with no gallops or rubs, positive systolic murmur at the base Respiratory with poor air movement and poor inspiratory effort with no wheezing or rhonchi on anterior auscultation  Abdomen protuberant but not tender Positive lower extremity edema ++ pitting with bilateral peripheral erythema  Data Reviewed:    Family Communication:  no family at the bedside   Disposition: Status is: Inpatient Remains inpatient appropriate because: respiratory failure   Planned Discharge Destination: Home      Author: Albertus Alt, MD 01/30/2024 3:28 PM  For on call review [email or web address].

## 2024-01-31 ENCOUNTER — Inpatient Hospital Stay (HOSPITAL_COMMUNITY)

## 2024-01-31 DIAGNOSIS — E785 Hyperlipidemia, unspecified: Secondary | ICD-10-CM

## 2024-01-31 DIAGNOSIS — E1169 Type 2 diabetes mellitus with other specified complication: Secondary | ICD-10-CM | POA: Diagnosis not present

## 2024-01-31 DIAGNOSIS — J9621 Acute and chronic respiratory failure with hypoxia: Secondary | ICD-10-CM | POA: Diagnosis not present

## 2024-01-31 DIAGNOSIS — E876 Hypokalemia: Secondary | ICD-10-CM | POA: Diagnosis not present

## 2024-01-31 DIAGNOSIS — I4892 Unspecified atrial flutter: Secondary | ICD-10-CM | POA: Diagnosis not present

## 2024-01-31 DIAGNOSIS — I5033 Acute on chronic diastolic (congestive) heart failure: Secondary | ICD-10-CM | POA: Diagnosis not present

## 2024-01-31 DIAGNOSIS — J9622 Acute and chronic respiratory failure with hypercapnia: Secondary | ICD-10-CM | POA: Diagnosis not present

## 2024-01-31 LAB — BASIC METABOLIC PANEL WITH GFR
Anion gap: 11 (ref 5–15)
BUN: 40 mg/dL — ABNORMAL HIGH (ref 6–20)
CO2: 44 mmol/L — ABNORMAL HIGH (ref 22–32)
Calcium: 8.5 mg/dL — ABNORMAL LOW (ref 8.9–10.3)
Chloride: 82 mmol/L — ABNORMAL LOW (ref 98–111)
Creatinine, Ser: 1.12 mg/dL — ABNORMAL HIGH (ref 0.44–1.00)
GFR, Estimated: 56 mL/min — ABNORMAL LOW
Glucose, Bld: 200 mg/dL — ABNORMAL HIGH (ref 70–99)
Potassium: 3.3 mmol/L — ABNORMAL LOW (ref 3.5–5.1)
Sodium: 137 mmol/L (ref 135–145)

## 2024-01-31 LAB — GLUCOSE, CAPILLARY
Glucose-Capillary: 197 mg/dL — ABNORMAL HIGH (ref 70–99)
Glucose-Capillary: 242 mg/dL — ABNORMAL HIGH (ref 70–99)
Glucose-Capillary: 213 mg/dL — ABNORMAL HIGH (ref 70–99)
Glucose-Capillary: 229 mg/dL — ABNORMAL HIGH (ref 70–99)

## 2024-01-31 LAB — MAGNESIUM: Magnesium: 2 mg/dL (ref 1.7–2.4)

## 2024-01-31 MED ORDER — ACETAZOLAMIDE 250 MG PO TABS
250.0000 mg | ORAL_TABLET | Freq: Two times a day (BID) | ORAL | Status: DC
  Administered 2024-01-31 – 2024-02-04 (×8): 250 mg via ORAL
  Filled 2024-01-31 (×10): qty 1

## 2024-01-31 MED ORDER — POTASSIUM CHLORIDE CRYS ER 20 MEQ PO TBCR
40.0000 meq | EXTENDED_RELEASE_TABLET | ORAL | Status: AC
  Administered 2024-01-31 (×2): 40 meq via ORAL
  Filled 2024-01-31 (×2): qty 2

## 2024-01-31 NOTE — Progress Notes (Signed)
   NAME:  Chelsea [name], MRN:  [id number], DOB:  1965-12-15, LOS: 3 ADMISSION DATE:  01/28/2024, CONSULTATION DATE:  01/29/2024 REFERRING MD:  Lind Repine, MD, CHIEF COMPLAINT:  SOB   History of Present Illness:  58 y/o female with PMH for Morbid obesity, A fib, likely OHS with chronic respiratory failure on 5 lioters home O2, COPD, HTN, Hep C, DMT2 who presented with 4 days of worsening SOB.  No fever/chills.  She was placed on BiPAP in ED for PCO2 of 73 and her SOB.  The BiPAP seemed to help and she was also given Lasix  40mg , Rocephin  and Azithromycin .  Patient c/o LE and foot pain and normally takes Oxycodone  3x/day.    Pertinent  Medical History  orbid obesity, A fib, likely OHS with chronic respiratory failure on 5 lioters home O2, COPD, HTN, Hep C, DMT2  Significant Hospital Events: Including procedures, antibiotic start and stop dates in addition to other pertinent events   4/30: can be admitted to floor/stepdown  Interim History / Subjective:  She says bipap makes her more anxious.  She says breathing feels better today than yesterday but overall still anxious and concerned for her health.  4L net negative yesterday  Objective   Blood pressure 104/75, pulse 66, temperature 97.8 F (36.6 C), temperature source Oral, resp. rate (!) 24, height 5\' 4"  (1.626 m), weight (!) 162.5 kg, SpO2 95%.    Vent Mode: BIPAP;PCV FiO2 (%):  [50 %-60 %] 50 % Set Rate:  [15 bmp] 15 bmp PEEP:  [8 cmH20] 8 cmH20 Pressure Support:  [8 cmH20] 8 cmH20   Intake/Output Summary (Last 24 hours) at 01/31/2024 1442 Last data filed at 01/31/2024 1238 Gross per 24 hour  Intake 829 ml  Output 3700 ml  Net -2871 ml   Filed Weights   01/28/24 1553 01/31/24 0350  Weight: (!) 141.1 kg (!) 162.5 kg    Examination: Morbidly obese Tachypnic, on nasal cannula [place] in bed Breath and heart sounds diminished RRR Pittng edema with chronic venous stasis  Cr 1.12 Na 137 K 3.3  Resolved Hospital Problem  list   N/a  Assessment & Plan:  Acute on chronic Hypercapnic and hypoxic respiratory failure, baseline 5LNC Acute on chronic HFpEF, moderate AS Atrial fibrillation - suspect secondary to PAP nonadherence and decompensated HFPEF - Continue TID lasix  with aldactone .  - has been seen by our practice several times in the last 6 months and tracheostomy has been recommended. I revisited this with her today and she is aware of this option and hope it doesn't come to this. If breathing decompensates she needs ICU.  - goal O2 saturations 88%  - hopefully we can improve her breathing with diuresis.  - she says she feels anxious about her general health. - she already has home bipap set up but needs f/u with our office for sleep as she approaches discharge. - still high risk for decompensation requiring ICU stay.   Pulmonary will see once over the weekend, call us  sooner with questions.  Rest per primary team  Louie Rover, MD Pulmonary and Critical Care Medicine [facility] 01/31/2024 2:42 PM Pager: see AMION  If no response to pager, please call critical care on call (see AMION) until 7pm After 7:00 pm call Elink

## 2024-01-31 NOTE — Progress Notes (Signed)
 Progress Note   Patient: Chelsea [name]:[id number] DOB: 1966-07-29 DOA: 01/28/2024     3 DOS: the patient was seen and examined on 01/31/2024   Brief hospital course: Chelsea [name] was admitted to the hospital with the working diagnosis of acute on chronic hypercapnic and hypoxemic respiratory failure.   58 yo female with the past medical history of T2DM, obesity hypoventilation syndrome, hypertension, COPD and obesity class 3 who presented with dyspnea.  Reported worsening symptoms for the last 4 days. Noted recently weaning off alprazolam .  On her initial physical examination her blood pressure was 127/57, HR 73, RR 18 and 02 saturation 85%  Lungs with no wheezing, positive bilateral rales, heart with S1 and S2 present and regular, abdomen with no distention and no lower extremity edema.   Chest radiograph with cardiomegaly with bilateral hilar vascular congestion, bilateral central interstitial infiltrates, with no effusions.   EKG 70 bpm, normal axis, normal intervals qtc 474, sinus rhythm with no significant ST segment changes, negative T wave V1 to V3.   Patient placed on high doses of diuretics for volume overload.  05/02 continue to have high 02 requirements per [place] high flow.   Assessment and Plan: * Acute on chronic diastolic CHF (congestive heart failure) (HCC) Echocardiogram with preserved LV systolic function with EF 60 to 65%, RV systolic function preserved, moderate valve aortic stenosis, RVSP 26,6 mmHg, LA and RA not enlarged, no pericardial effusion.   Urine output 4,950 ml  Systolic blood pressure 100 mmHg range  05/02 follow up chest radiograph with improved interstitial infiltrates, with bibasilar atelectasis   Continue furosemide  60 mg IV tid.  Continue metoprolol  and spironolactone   Will hold on SGLT 2 inh due to body habitus and risk of urinary tract infections.  05/01 metolazone  2,5 mg.  Add acetazolamide  250 mg po bid.   Continue to encourage airway  clearing techniques with flutter valve and incentive spirometer.  Out of bed to chair tid with meals   Acute on chronic hypoxemic and hypercapnic respiratory failure. Acute cardiogenic pulmonary edema.  Obesity hypoventilation syndrome  Plan to continue diuresis, biapap for sleep and as needed during the day.  05/01 ABG 7,48/ 71/ 90/25 02 saturation 97%  Continue supplemental 02 per  to keep -02 saturation 88% or greater. This am worsening oxygenation due to atelectasis.   Atrial flutter with rapid ventricular response (HCC) Continue rate control with metoprolol / amiodarone  and anticoagulation with apixaban .  Continue telemetry monitoring.   Hypokalemia AKI Renal function today with serum cr at 1,12 with K at 3,3 and serum bicarbonate at 44 Na 137, and Mg 2,0   Plan to continue K correction with Kcl 40 meq x 2 doses Follow up renal function in am Avoid hypotension and nephrotoxic medications,  Continue diuresis   Type 2 diabetes mellitus with hyperlipidemia (HCC) Continue glucose cover and monitoring with insulin  sliding scale Continue basal insulin  10 units.  Patient is tolerating po well, her fasting glucose today is 200 mg/dl. Uncontrolled hyperglycemia.    Continue statin therapy.   Chronic iron  deficiency anemia Cell count has been stable   Hypothyroidism Continue levothyroxine    Obesity, class 3 Calculated BMI is 53.3   Anxiety Added as needed alprazolam .  Continue with bupropion .       Subjective: patient did not use bipap last night until 4 am this morning, this morning with worsening dyspnea and increased 02 requirements up to 15 L per min per high flow nasal cannula   Physical Exam:  Vitals:   01/30/24 1952 01/30/24 2321 01/31/24 0221 01/31/24 0350  BP: 120/71 106/69  104/72  Pulse: 67 71 68 64  Resp: 17 19 (!) 23 18  Temp: 98.7 F (37.1 C) 97.8 F (36.6 C)  97.8 F (36.6 C)  TempSrc: Oral Oral  Oral  SpO2: 100% 91% 95% 90%  Weight:    (!)  162.5 kg  Height:       Neurology awake and alert ENT with mild pallor Cardiovascular with S1 and S2 present and regular with no gallops or rubs, positive systolic murmur at the base Respiratory with rales at dependent zones, has no wheezing or rhonchi, on anterior auscultation. Mild increased work of breathing, but no noted accessory muscle use Abdomen protuberant with no distention, non tender Positive lower extremity edema ++ up to the thighs  Data Reviewed:    Family Communication: no family at the bedside   Disposition: Status is: Inpatient Remains inpatient appropriate because: IV diuresis and respiratory support   Planned Discharge Destination: Home     Author: Albertus Alt, MD 01/31/2024 8:36 AM  For on call review [email or web address].

## 2024-01-31 NOTE — Progress Notes (Signed)
   01/31/24 0221  BiPAP/CPAP/SIPAP  $ Non-Invasive Ventilator  Non-Invasive Vent Subsequent  BiPAP/CPAP/SIPAP Pt Type Adult  BiPAP/CPAP/SIPAP SERVO  Reason BIPAP/CPAP not in use Other(comment);Non-compliant (pt informed RT she will call if she wants to wear it.)  BiPAP/CPAP /SiPAP Vitals  Pulse Rate 68  Resp (!) 23  SpO2 95 %  Bilateral Breath Sounds Diminished  MEWS Score/Color  MEWS Score 1  MEWS Score Color Green

## 2024-01-31 NOTE — Progress Notes (Signed)
 OT Cancellation Note  Patient Details Name: Chelsea [name] MRN: [id number] DOB: 1966-07-15   Cancelled Treatment:    Reason Eval/Treat Not Completed:  OT reordered. Pt seen on 5/1 with no further OT needs. Signing off.  Jonette Nestle 01/31/2024, 2:03 PM

## 2024-01-31 NOTE — Progress Notes (Signed)
 RT called to bedside due to oxygen  desaturation to 46%. Patient had removed the Bipap mask accidentally in her sleep which caused the desaturation. RT & RNs placed Bipap mask back on patient and her O2 sats increased to 97% and her WOB decreased. Patient is nervous and requested to come back off Bipap. RT placed patient on HFNC 15L, sats have stabilized at 94%, no increased WOB at this time.

## 2024-01-31 NOTE — Progress Notes (Signed)
 Patient's O2 dropped into the 40's, she took her bipap off. We were able to get her back up to the 90's with the bipap. she wanted to be trialed  with Nasal cannula. placed her on 10L and she dropped. She is now on 15L and we are watching her closely. Respiratory was in the room. Notified MD.

## 2024-01-31 NOTE — Progress Notes (Signed)
 PT Cancellation Note  Patient Details Name: Chelsea [name] MRN: [id number] DOB: 1966-09-07   Cancelled Treatment:    Reason Eval/Treat Not Completed: Acute PT reordered. Pt seen by PT on 5/1 with no further acute PT needs. Pt was at baseline requiring TotalAx2 for mobility with using hoyer lift for OOB mobility at baseline. Acute PT signing off.  Orysia Blas, PT, DPT Secure [facility] [phone]   [name] 01/31/2024, 5:13 PM

## 2024-01-31 NOTE — Plan of Care (Signed)
  Problem: Fluid Volume: Goal: Ability to maintain a balanced intake and output will improve Outcome: Not Progressing   Problem: Metabolic: Goal: Ability to maintain appropriate glucose levels will improve Outcome: Not Progressing   Problem: Skin Integrity: Goal: Risk for impaired skin integrity will decrease Outcome: Not Progressing   Problem: Tissue Perfusion: Goal: Adequacy of tissue perfusion will improve Outcome: Not Progressing   Problem: Clinical Measurements: Goal: Respiratory complications will improve Outcome: Not Progressing   Problem: Clinical Measurements: Goal: Cardiovascular complication will be avoided Outcome: Not Progressing   Problem: Activity: Goal: Risk for activity intolerance will decrease Outcome: Not Progressing   Problem: Coping: Goal: Level of anxiety will decrease Outcome: Not Progressing   Problem: Pain Managment: Goal: General experience of comfort will improve and/or be controlled Outcome: Not Progressing   Problem: Cardiac: Goal: Ability to achieve and maintain adequate cardiopulmonary perfusion will improve Outcome: Not Progressing

## 2024-02-01 DIAGNOSIS — I4892 Unspecified atrial flutter: Secondary | ICD-10-CM | POA: Diagnosis not present

## 2024-02-01 DIAGNOSIS — E876 Hypokalemia: Secondary | ICD-10-CM | POA: Diagnosis not present

## 2024-02-01 DIAGNOSIS — I5033 Acute on chronic diastolic (congestive) heart failure: Secondary | ICD-10-CM | POA: Diagnosis not present

## 2024-02-01 DIAGNOSIS — E1169 Type 2 diabetes mellitus with other specified complication: Secondary | ICD-10-CM | POA: Diagnosis not present

## 2024-02-01 LAB — GLUCOSE, CAPILLARY
Glucose-Capillary: 185 mg/dL — ABNORMAL HIGH (ref 70–99)
Glucose-Capillary: 183 mg/dL — ABNORMAL HIGH (ref 70–99)
Glucose-Capillary: 227 mg/dL — ABNORMAL HIGH (ref 70–99)
Glucose-Capillary: 232 mg/dL — ABNORMAL HIGH (ref 70–99)
Glucose-Capillary: 251 mg/dL — ABNORMAL HIGH (ref 70–99)

## 2024-02-01 LAB — MAGNESIUM: Magnesium: 2.1 mg/dL (ref 1.7–2.4)

## 2024-02-01 LAB — BASIC METABOLIC PANEL WITH GFR
Anion gap: 16 — ABNORMAL HIGH (ref 5–15)
BUN: 44 mg/dL — ABNORMAL HIGH (ref 6–20)
CO2: 40 mmol/L — ABNORMAL HIGH (ref 22–32)
Calcium: 8.5 mg/dL — ABNORMAL LOW (ref 8.9–10.3)
Chloride: 81 mmol/L — ABNORMAL LOW (ref 98–111)
Creatinine, Ser: 0.97 mg/dL (ref 0.44–1.00)
GFR, Estimated: >60 mL/min (ref >60)
Glucose, Bld: 175 mg/dL — ABNORMAL HIGH (ref 70–99)
Potassium: 4 mmol/L (ref 3.5–5.1)
Sodium: 137 mmol/L (ref 135–145)

## 2024-02-01 MED ORDER — MONTELUKAST SODIUM 10 MG PO TABS
10.0000 mg | ORAL_TABLET | Freq: Every day | ORAL | Status: DC
  Administered 2024-02-01 – 2024-02-17 (×16): 10 mg via ORAL
  Filled 2024-02-01 (×16): qty 1

## 2024-02-01 MED ORDER — METOPROLOL SUCCINATE ER 25 MG PO TB24
25.0000 mg | ORAL_TABLET | Freq: Every day | ORAL | Status: DC
  Administered 2024-02-01 – 2024-02-05 (×5): 25 mg via ORAL
  Filled 2024-02-01 (×6): qty 1

## 2024-02-01 MED ORDER — BUDESON-GLYCOPYRROL-FORMOTEROL 160-9-4.8 MCG/ACT IN AERO
2.0000 | INHALATION_SPRAY | Freq: Two times a day (BID) | RESPIRATORY_TRACT | Status: DC
  Administered 2024-02-01 – 2024-02-02 (×2): 2 via RESPIRATORY_TRACT
  Filled 2024-02-01: qty 5.9

## 2024-02-01 MED ORDER — BUSPIRONE HCL 5 MG PO TABS
10.0000 mg | ORAL_TABLET | Freq: Three times a day (TID) | ORAL | Status: DC
  Administered 2024-02-01 – 2024-02-02 (×3): 10 mg via ORAL
  Filled 2024-02-01 (×3): qty 2

## 2024-02-01 NOTE — Plan of Care (Signed)
  Problem: Coping: Goal: Ability to adjust to condition or change in health will improve Outcome: Progressing   Problem: Fluid Volume: Goal: Ability to maintain a balanced intake and output will improve Outcome: Progressing   Problem: Health Behavior/Discharge Planning: Goal: Ability to manage health-related needs will improve Outcome: Progressing   Problem: Metabolic: Goal: Ability to maintain appropriate glucose levels will improve Outcome: Progressing   Problem: Education: Goal: Knowledge of General Education information will improve Description: Including pain rating scale, medication(s)/side effects and non-pharmacologic comfort measures Outcome: Progressing   

## 2024-02-01 NOTE — Progress Notes (Signed)
 Progress Note   Patient: Chelsea [name] ZOX:[id number] DOB: 1966/05/22 DOA: 01/28/2024     4 DOS: the patient was seen and examined on 02/01/2024   Brief hospital course: Mrs. Chelsea [name] was admitted to the hospital with the working diagnosis of acute on chronic hypercapnic and hypoxemic respiratory failure.   58 yo female with the past medical history of T2DM, obesity hypoventilation syndrome, hypertension, COPD and obesity class 3 who presented with dyspnea.  Reported worsening symptoms for the last 4 days. Noted recently weaning off alprazolam .  On her initial physical examination her blood pressure was 127/57, HR 73, RR 18 and 02 saturation 85%  Lungs with no wheezing, positive bilateral rales, heart with S1 and S2 present and regular, abdomen with no distention and no lower extremity edema.   Chest radiograph with cardiomegaly with bilateral hilar vascular congestion, bilateral central interstitial infiltrates, with no effusions.   EKG 70 bpm, normal axis, normal intervals qtc 474, sinus rhythm with no significant ST segment changes, negative T wave V1 to V3.   Patient placed on high doses of diuretics for volume overload.  05/02 continue to have high 02 requirements per [place] high flow.  05/03 continue diuresing, slow wean of supplemental 02   Assessment and Plan: * Acute on chronic diastolic CHF (congestive heart failure) (HCC) Echocardiogram with preserved LV systolic function with EF 60 to 65%, RV systolic function preserved, moderate valve aortic stenosis, RVSP 26,6 mmHg, LA and RA not enlarged, no pericardial effusion.   Urine output 2,900 ml  Systolic blood pressure 100 mmHg range  05/02 follow up chest radiograph with improved interstitial infiltrates, with bibasilar atelectasis   Continue diuresis with furosemide  60 mg IV tid, spironolactone  and acetazolamide .  Continue metoprolol  succinate.   Will hold on SGLT 2 inh due to body habitus and risk of urinary tract  infections.  05/01 metolazone  2,5 mg.   Continue to encourage airway clearing techniques with flutter valve and incentive spirometer.  Out of bed to chair tid with meals   Acute on chronic hypoxemic and hypercapnic respiratory failure. Acute cardiogenic pulmonary edema.  Obesity hypoventilation syndrome  Plan to continue diuresis, biapap for sleep and as needed during the day.  05/01 ABG 7,48/ 71/ 90/25 02 saturation 97%  Continue supplemental 02 per [place] to keep -02 saturation 88% or greater.  Poor mobility due to body habitus,  oxymetry on her toes due to poor signal in her forehead and earlobe, fingers with nail polisher in place.  Questionable accuracy of oxymetry, possible underestimating.   Atrial flutter with rapid ventricular response (HCC) Continue rate control with metoprolol  (reduced dose to 25 mg to decreased risk of bradycardia) / amiodarone  and anticoagulation with apixaban .  Continue telemetry monitoring.   Hypokalemia AKI Renal function today with serum cr at 0,97 with K at 4,0 and serum bicarbonate at 40  Na 137 and Mg 2,1   Continue diuresis and follow up renal function and electrolytes.   Type 2 diabetes mellitus with hyperlipidemia (HCC) Continue glucose cover and monitoring with insulin  sliding scale Continue basal insulin  10 units.  Patient is tolerating po well, her fasting glucose today is 175 mg/dl. Uncontrolled hyperglycemia.    Continue statin therapy.   Chronic iron  deficiency anemia Cell count has been stable   Hypothyroidism Continue levothyroxine    Obesity, class 3 Calculated BMI is 53.3   Anxiety Added as needed alprazolam .  Continue with bupropion .       Subjective: Patient with no chest pain, she continue  to have dyspnea, that improved with supplemental 02 and bipap, no chest pain, tolerating po well with no nausea or vomiting   Physical Exam: Vitals:   02/01/24 0618 02/01/24 0845 02/01/24 0948 02/01/24 1310  BP:  (!) 90/52 108/64  110/79  Pulse:  (!) 57 64 66  Resp:  16 20 (!) 21  Temp:  97.6 F (36.4 C)  97.6 F (36.4 C)  TempSrc:  Axillary  Axillary  SpO2:  92% 96% 94%  Weight: (!) 162.2 kg     Height:       Neurology awake and alert ENT with mild pallor Cardiovascular with S1 and S2 present with no gallops, positive systolic murmur at the base Respiratory with decreased breath sounds with no wheezing or rhonchi, on anterior auscultation  Abdomen with no distention  Positive lower extremity edema pitting + to ++ with component of lymphedema.  Data Reviewed:    Family Communication: no family at the bedside   Disposition: Status is: Inpatient Remains inpatient appropriate because: respiratory failure   Planned Discharge Destination: Home     Author: Albertus Alt, MD 02/01/2024 3:06 PM  For on call review [email or web address].

## 2024-02-01 NOTE — Progress Notes (Signed)
   02/01/24 0249  BiPAP/CPAP/SIPAP  $ Non-Invasive Ventilator  Non-Invasive Vent Subsequent  BiPAP/CPAP/SIPAP Pt Type Adult  BiPAP/CPAP/SIPAP SERVO  Mask Type Full face mask  IPAP 23 cmH20  EPAP 8 cmH2O  FiO2 (%) 50 %  Minute Ventilation 9.4  Leak 64  Peak Inspiratory Pressure (PIP) 18  Tidal Volume (Vt) 368  Patient Home Machine No  Patient Home Mask No  Patient Home Tubing No  Device Plugged into RED Power Outlet Yes  BiPAP/CPAP /SiPAP Vitals  Resp 20  Bilateral Breath Sounds Diminished  MEWS Score/Color  MEWS Score 1  MEWS Score Color Green

## 2024-02-02 ENCOUNTER — Inpatient Hospital Stay (HOSPITAL_COMMUNITY)

## 2024-02-02 DIAGNOSIS — I272 Pulmonary hypertension, unspecified: Secondary | ICD-10-CM | POA: Diagnosis not present

## 2024-02-02 DIAGNOSIS — E876 Hypokalemia: Secondary | ICD-10-CM | POA: Diagnosis not present

## 2024-02-02 DIAGNOSIS — J9621 Acute and chronic respiratory failure with hypoxia: Secondary | ICD-10-CM | POA: Diagnosis not present

## 2024-02-02 DIAGNOSIS — I4891 Unspecified atrial fibrillation: Secondary | ICD-10-CM

## 2024-02-02 DIAGNOSIS — I4892 Unspecified atrial flutter: Secondary | ICD-10-CM | POA: Diagnosis not present

## 2024-02-02 DIAGNOSIS — J9622 Acute and chronic respiratory failure with hypercapnia: Secondary | ICD-10-CM | POA: Diagnosis not present

## 2024-02-02 DIAGNOSIS — E1169 Type 2 diabetes mellitus with other specified complication: Secondary | ICD-10-CM | POA: Diagnosis not present

## 2024-02-02 DIAGNOSIS — I5033 Acute on chronic diastolic (congestive) heart failure: Secondary | ICD-10-CM | POA: Diagnosis not present

## 2024-02-02 DIAGNOSIS — G4733 Obstructive sleep apnea (adult) (pediatric): Secondary | ICD-10-CM

## 2024-02-02 LAB — GLUCOSE, CAPILLARY
Glucose-Capillary: 181 mg/dL — ABNORMAL HIGH (ref 70–99)
Glucose-Capillary: 268 mg/dL — ABNORMAL HIGH (ref 70–99)
Glucose-Capillary: 161 mg/dL — ABNORMAL HIGH (ref 70–99)
Glucose-Capillary: 146 mg/dL — ABNORMAL HIGH (ref 70–99)

## 2024-02-02 LAB — BASIC METABOLIC PANEL WITH GFR
Anion gap: 15 (ref 5–15)
BUN: 53 mg/dL — ABNORMAL HIGH (ref 6–20)
CO2: 44 mmol/L — ABNORMAL HIGH (ref 22–32)
Calcium: 8.6 mg/dL — ABNORMAL LOW (ref 8.9–10.3)
Chloride: 77 mmol/L — ABNORMAL LOW (ref 98–111)
Creatinine, Ser: 0.94 mg/dL (ref 0.44–1.00)
GFR, Estimated: >60 mL/min (ref >60)
Glucose, Bld: 222 mg/dL — ABNORMAL HIGH (ref 70–99)
Potassium: 3.1 mmol/L — ABNORMAL LOW (ref 3.5–5.1)
Sodium: 136 mmol/L (ref 135–145)

## 2024-02-02 LAB — MAGNESIUM: Magnesium: 2.3 mg/dL (ref 1.7–2.4)

## 2024-02-02 MED ORDER — LEVOFLOXACIN IN D5W 500 MG/100ML IV SOLN: 500.0000 mg | INTRAVENOUS | Status: DC

## 2024-02-02 MED ORDER — HYDROCODONE-ACETAMINOPHEN 5-325 MG PO TABS
1.0000 | ORAL_TABLET | Freq: Four times a day (QID) | ORAL | Status: DC | PRN
  Administered 2024-02-04 – 2024-02-18 (×37): 1 via ORAL
  Filled 2024-02-02 (×37): qty 1

## 2024-02-02 MED ORDER — HYDROMORPHONE HCL 1 MG/ML IJ SOLN
0.5000 mg | INTRAMUSCULAR | Status: DC | PRN
  Administered 2024-02-02 – 2024-02-05 (×11): 0.5 mg via INTRAVENOUS
  Filled 2024-02-02 (×11): qty 0.5

## 2024-02-02 MED ORDER — REVEFENACIN 175 MCG/3ML IN SOLN
175.0000 ug | Freq: Every day | RESPIRATORY_TRACT | Status: DC
  Administered 2024-02-02 – 2024-02-16 (×13): 175 ug via RESPIRATORY_TRACT
  Filled 2024-02-02 (×15): qty 3

## 2024-02-02 MED ORDER — POTASSIUM CHLORIDE CRYS ER 20 MEQ PO TBCR
40.0000 meq | EXTENDED_RELEASE_TABLET | ORAL | Status: AC
  Administered 2024-02-02: 40 meq via ORAL
  Filled 2024-02-02: qty 2

## 2024-02-02 MED ORDER — ARFORMOTEROL TARTRATE 15 MCG/2ML IN NEBU
15.0000 ug | INHALATION_SOLUTION | Freq: Two times a day (BID) | RESPIRATORY_TRACT | Status: DC
  Administered 2024-02-02 – 2024-02-16 (×26): 15 ug via RESPIRATORY_TRACT
  Filled 2024-02-02 (×26): qty 2

## 2024-02-02 MED ORDER — BUDESONIDE 0.5 MG/2ML IN SUSP
0.5000 mg | Freq: Two times a day (BID) | RESPIRATORY_TRACT | Status: DC
  Administered 2024-02-02 – 2024-02-16 (×26): 0.5 mg via RESPIRATORY_TRACT
  Filled 2024-02-02 (×28): qty 2

## 2024-02-02 MED ORDER — SODIUM CHLORIDE 0.9 % IV SOLN
2.0000 g | INTRAVENOUS | Status: DC
  Administered 2024-02-02 – 2024-02-05 (×4): 2 g via INTRAVENOUS
  Filled 2024-02-02 (×4): qty 20

## 2024-02-02 NOTE — Progress Notes (Signed)
   NAME:  Chelsea [name], MRN:  [id number], DOB:  1966-03-02, LOS: 5 ADMISSION DATE:  01/28/2024, CONSULTATION DATE:  01/29/2024 REFERRING MD:  Lind Repine, MD, CHIEF COMPLAINT:  SOB   History of Present Illness:  58 y/o female with PMH for Morbid obesity, A fib, likely OHS with chronic respiratory failure on 5 lioters home O2, COPD, HTN, Hep C, DMT2 who presented with 4 days of worsening SOB.  No fever/chills.  She was placed on BiPAP in ED for PCO2 of 73 and her SOB.  The BiPAP seemed to help and she was also given Lasix  40mg , Rocephin  and Azithromycin .  Patient c/o LE and foot pain and normally takes Oxycodone  3x/day.    Pertinent  Medical History  orbid obesity, A fib, likely OHS with chronic respiratory failure on 5 lioters home O2, COPD, HTN, Hep C, DMT2  Significant Hospital Events: Including procedures, antibiotic start and stop dates in addition to other pertinent events   4/30: can be admitted to floor/stepdown 5/2  Interim History / Subjective:  She says bipap makes her more anxious.  She says breathing feels better today than yesterday but overall still anxious and concerned for her health.  4L net negative yesterday  Objective   Blood pressure 103/64, pulse 61, temperature (!) 97.2 F (36.2 C), temperature source Axillary, resp. rate (!) 22, height 5\' 4"  (1.626 m), weight (!) 162.8 kg, SpO2 92%.    FiO2 (%):  [60 %] 60 %   Intake/Output Summary (Last 24 hours) at 02/02/2024 1256 Last data filed at 02/02/2024 1000 Gross per 24 hour  Intake 240 ml  Output 2300 ml  Net -2060 ml   Filed Weights   01/31/24 0350 02/01/24 0618 02/02/24 0434  Weight: (!) 162.5 kg (!) 162.2 kg (!) 162.8 kg    Examination: Morbidly obese, bipap in place Tachypnic, on nasal cannula Diminished breath sounds RRR Abdomen is soft Pittng edema with chronic venous stasis  Resolved Hospital Problem list   N/a  Assessment & Plan:  Acute on chronic Hypercapnic and hypoxic respiratory  failure, baseline 5LNC Acute on chronic HFpEF, moderate AS Morbid Obesity Pulmonary Hypertension Obstructive Sleep Apnea Obesity Hypoventilation Syndrome Atrial fibrillation  - Continue TID 80mg  lasix  with aldactone .  - change breztri inhaler to budesonide , yupelri and brovana nebs - she remains intermittently bipap dependent, may require transfer to ICU. If she is intubated will likely need tracheostomy as recommended in prior consultations - Agree with starting antibiotics ceftriaxone  - check sputum culture and resp viral panel - consider speech evaluation to evaluate for possible aspiration  PCCM will continue to follow  Duaine German, MD [facility] Office: [phone]   See Amion for personal pager PCCM on call pager [phone] until 7pm. Please call Elink 7p-7a. [phone]

## 2024-02-02 NOTE — Progress Notes (Addendum)
 Progress Note   Patient: Chelsea [name] TFT:[id number] DOB: 1966/03/08 DOA: 01/28/2024     5 DOS: the patient was seen and examined on 02/02/2024   Brief hospital course: Mrs. Voisine was admitted to the hospital with the working diagnosis of acute on chronic hypercapnic and hypoxemic respiratory failure.   58 yo female with the past medical history of T2DM, obesity hypoventilation syndrome, hypertension, COPD and obesity class 3 who presented with dyspnea.  Reported worsening symptoms for the last 4 days. Noted recently weaning off alprazolam .  On her initial physical examination her blood pressure was 127/57, HR 73, RR 18 and 02 saturation 85%  Lungs with no wheezing, positive bilateral rales, heart with S1 and S2 present and regular, abdomen with no distention and no lower extremity edema.   Chest radiograph with cardiomegaly with bilateral hilar vascular congestion, bilateral central interstitial infiltrates, with no effusions.   EKG 70 bpm, normal axis, normal intervals qtc 474, sinus rhythm with no significant ST segment changes, negative T wave V1 to V3.   Patient placed on high doses of diuretics for volume overload.  05/02 continue to have high 02 requirements per Simpson high flow.  05/03 continue diuresing, slow wean of supplemental 02  05/04 worsening oxygenation, this morning on non re-breather and high flow nasal cannula despite diuresis.   Assessment and Plan: * Acute on chronic diastolic CHF (congestive heart failure) (HCC) Echocardiogram with preserved LV systolic function with EF 60 to 65%, RV systolic function preserved, moderate valve aortic stenosis, RVSP 26,6 mmHg, LA and RA not enlarged, no pericardial effusion.   Urine output 2,500 ml  Systolic blood pressure 100 mmHg range  05/02 follow up chest radiograph with improved interstitial infiltrates, with bibasilar atelectasis   Continue diuresis with furosemide  60 mg IV tid, spironolactone  and acetazolamide .   Continue metoprolol  succinate.   Will hold on SGLT 2 inh due to body habitus and risk of urinary tract infections.  05/01 metolazone  2,5 mg.   Continue to encourage airway clearing techniques with flutter valve and incentive spirometer.  Out of bed to chair tid with meals   Acute on chronic hypoxemic and hypercapnic respiratory failure. Acute cardiogenic pulmonary edema.  Obesity hypoventilation syndrome  Plan to continue diuresis, biapap for sleep and as needed during the day.  05/01 ABG 7,48/ 71/ 90/25 02 saturation 97%  Today with worsening respiratory failure and increase 02 requirements high flow nasal canula 10 L/min and non re breather mask with intermittent use of bipap.   Will add antibiotic therapy with Levofloxacin  patient is allergic to penicillin and cefepime .  Further work up with non contrast CT chest.  Keep 02 saturation 88% or greater.   Atrial flutter with rapid ventricular response (HCC) Continue sinus rhythm in the 60's range bpm.  Continue rate control with metoprolol  (reduced dose to 25 mg to decreased risk of bradycardia) / amiodarone  and anticoagulation with apixaban .  Continue telemetry monitoring.   Hypokalemia AKI Today renal function with serum cr at 0,94 with K at 3,1 and serum bicarbonate at 44  Na 136 and Mg 2.3   Add 40 meq Kcl x 2 doses Continue diuresis and follow up renal function and electrolytes.   Type 2 diabetes mellitus with hyperlipidemia (HCC) Continue glucose cover and monitoring with insulin  sliding scale Continue basal insulin  10 units.  Her fasting glucose today is 222 mg/dl. Uncontrolled hyperglycemia.    Continue statin therapy.   Chronic iron  deficiency anemia Cell count has been stable  Hypothyroidism Continue levothyroxine    Obesity, class 3 Calculated BMI is 53.3   Anxiety Added as needed alprazolam .  Continue with bupropion  (will discontinue short acting tid dosing and continue with bid long acting) to prevent  encephalopathy.   Acute on chronic back pain, continue with as needed hydrocodone  and will add low dose IV hydromorphone  for severe pain.     Subjective: Patient with severe back pain radiated to her legs, no chest pain, continue to have dyspnea with cough,   Physical Exam: Vitals:   02/01/24 2336 02/02/24 0317 02/02/24 0434 02/02/24 0803  BP: 111/73 103/64    Pulse: 61 61    Resp: (!) 23 (!) 22    Temp: 97.7 F (36.5 C) (!) 97.2 F (36.2 C)    TempSrc: Oral Axillary    SpO2: (!) 89% 99%  92%  Weight:   (!) 162.8 kg   Height:       Neurology deconditioned and ill looking appearing positive dyspnea at rest, in pain.  ENT with mild pallor Cardiovascular with S1 and S2 present and regular with no gallops or rubs, positive systolic murmur at the apex Wide neck not able to check for CVP Respiratory with increased work of breathing with no wheezing or rales, no rhonchi on anterior auscultation with poor ventilation.  Abdomen protuberant with no distention  Lower extremity with no ankle edema, positive lymphedema  Data Reviewed:    Family Communication: no family at the beside   Disposition: Status is: Inpatient Remains inpatient appropriate because: respiratory failure   Planned Discharge Destination: Home     Author: Albertus Alt, MD 02/02/2024 10:47 AM  For on call review [email or web address].

## 2024-02-02 NOTE — Progress Notes (Signed)
 Patient transport to CT and back to room, without complications on the below BiPAP settings.  Pt tolerated well and RT will continue to monitor.   02/02/24 1622  BiPAP/CPAP/SIPAP  BiPAP/CPAP/SIPAP Pt Type Adult  BiPAP/CPAP/SIPAP SERVO  Mask Type Full face mask  Mask Size Large  Set Rate 15 breaths/min  Respiratory Rate 22 breaths/min  IPAP 20 cmH20  EPAP 8 cmH2O  FiO2 (%) 60 %  Minute Ventilation 9.8  Leak 47  Peak Inspiratory Pressure (PIP) 21  Tidal Volume (Vt) 423  Patient Home Machine No  Patient Home Mask No  Patient Home Tubing No  Auto Titrate No  Press High Alarm 30 cmH2O  Press Low Alarm 5 cmH2O  BiPAP/CPAP /SiPAP Vitals  Pulse Rate 66  SpO2 98 %  MEWS Score/Color  MEWS Score 1  MEWS Score Color Chelsea [name]

## 2024-02-02 NOTE — Progress Notes (Signed)
   02/02/24 1310  Assess: MEWS Score  BP (!) 109/59  Pulse Rate 60  Resp (!) 26  SpO2 95 %  FiO2 (%) 100 %  Assess: MEWS Score  MEWS Temp 0  MEWS Systolic 0  MEWS Pulse 0  MEWS RR 2  MEWS LOC 0  MEWS Score 2  MEWS Score Color Yellow  Assess: if the MEWS score is Yellow or Red  Were vital signs accurate and taken at a resting state? Yes  Does the patient meet 2 or more of the SIRS criteria? Yes  Does the patient have a confirmed or suspected source of infection? No  MEWS guidelines implemented  Yes, yellow  Treat  MEWS Interventions Considered administering scheduled or prn medications/treatments as ordered  Take Vital Signs  Increase Vital Sign Frequency  Yellow: Q2hr x1, continue Q4hrs until patient remains green for 12hrs  Escalate  MEWS: Escalate Yellow: Discuss with charge nurse and consider notifying provider and/or RRT  Notify: Charge Nurse/RN  Name of Charge Nurse/RN Notified Bridgette Campus, RN  Provider Notification  Provider Name/Title Dr. Sunnie England  Date Provider Notified 02/02/24  Time Provider Notified 1320  Method of Notification Page;Face-to-face  Notification Reason Change in status  Provider response See new orders  Date of Provider Response 02/02/24  Time of Provider Response 1320  Notify: Rapid Response  Name of Rapid Response RN Notified Shelby, RN  Date Rapid Response Notified 02/02/24  Time Rapid Response Notified 1320  Assess: SIRS CRITERIA  SIRS Temperature  0  SIRS Respirations  1  SIRS Pulse 0  SIRS WBC 0  SIRS Score Sum  1

## 2024-02-02 NOTE — Significant Event (Addendum)
 Rapid Response Event Note   Reason for Call :  Acute respiratory distress  Patient on 15L HFNC this morning. At 1000, primary RN placed pt on 15L HFNC and 100% NRB. Pt having increased work of breathing. RT was called and placed patient back on BiPAP.   Initial Focused Assessment:  Pt lying in bed, alert. Restless, anxious and complaining of being hot. Breathing is tachypneic, RR 24, with accessory muscle use. Breath sounds are clear in the upper lobes, diminished in the bases. Skin is warm, flush.   VS: BP 109/59, HR 60, RR 26, SpO2 95% on BiPAP 100% 20/8  Interventions:  -BiPAP -PRN Albuterol  -CXR  Plan of Care:  -BiPAP weaned to 70%, SpO2 remaining 92-94% -PCCM onboard as consult  -Pending CT chest -Pending SLP eval  Event Summary:  MD Notified: Dr. Sunnie England Call Time: 1307 Arrival Time: 1315 End Time: 1340  Washington Hacker, RN

## 2024-02-02 NOTE — Progress Notes (Signed)
 Attempted to turn/reposition patient x10 throughout the shift,refused. Education given on benefits and importance of frequent turns with patients limited mobility. Patient also refused am labs, education provided on having labs drawn, stated, she would have them drawn later in the day.

## 2024-02-02 NOTE — Plan of Care (Signed)
  Problem: Education: Goal: Ability to describe self-care measures that may prevent or decrease complications (Diabetes Survival Skills Education) will improve Outcome: Not Progressing   Problem: Nutritional: Goal: Maintenance of adequate nutrition will improve Outcome: Not Progressing Goal: Progress toward achieving an optimal weight will improve Outcome: Not Progressing   Problem: Skin Integrity: Goal: Risk for impaired skin integrity will decrease Outcome: Not Progressing

## 2024-02-02 NOTE — Progress Notes (Signed)
 SLP Cancellation Note  Patient Details Name: Chelsea [name] MRN: [id number] DOB: Aug 19, 1966   Cancelled treatment:       Reason Eval/Treat Not Completed: Medical issues which prohibited therapy Rapid response called due to patient with increased WOB. She was placed back on BiPAP. SLP will follow for PO readiness.  Jacqualine Mater, MA, CCC-SLP Speech Therapy

## 2024-02-02 NOTE — Progress Notes (Addendum)
 Patient continue with increased work of breathing and increased 02 requirements.  She is having anxiety and back pain.  Placed on Bipap 20/8 with Fi02 70% with 02 saturation 95% and improved work of breathing.  Chest radiograph with bilateral interstitial infiltrates, cardiomegaly, personally reviewed.   Plan to keep on Bipap and follow up CT chest.  Follow up with critical care recommendations.   If continue to have agitation one possibility will be to use dexemedetomidine infusion to allow better non invasive mechanical ventilation.  Considering her body habitus and short neck, I suspect she is a difficult intubation,

## 2024-02-02 NOTE — Progress Notes (Signed)
 RT call by nurse to room stat, due to patient being in respiratory distress.  When RT arrived to room, pt was on 100% NRB and 15L [place] with SPO2 around 85%.  RN had attempted to place pt back on BiPAP prior to my arrival, but pt did not tolerate at that time due to anxiety. Patient was placed on BiPAP on below settings.  After a few minutes, SPO2 increased to 90%.  Patient currently tolerating BiPAP well, RT will continue to monitor.   02/02/24 1310  BiPAP/CPAP/SIPAP  BiPAP/CPAP/SIPAP Pt Type Adult  BiPAP/CPAP/SIPAP SERVO  Mask Type Full face mask  Mask Size Large  Set Rate 15 breaths/min  Respiratory Rate 24 breaths/min  IPAP 20 cmH20  EPAP 8 cmH2O  FiO2 (%) 100 %  Minute Ventilation 8.6  Leak 32  Peak Inspiratory Pressure (PIP) 21  Tidal Volume (Vt) 378  Patient Home Machine No  Patient Home Mask No  Patient Home Tubing No  Auto Titrate No  Press High Alarm 30 cmH2O  Press Low Alarm 5 cmH2O  BiPAP/CPAP /SiPAP Vitals  Pulse Rate 60  Resp (!) 26  BP (!) 109/59  SpO2 95 %  MEWS Score/Color  MEWS Score 2  MEWS Score Color Yellow

## 2024-02-03 DIAGNOSIS — I4892 Unspecified atrial flutter: Secondary | ICD-10-CM | POA: Diagnosis not present

## 2024-02-03 DIAGNOSIS — E1169 Type 2 diabetes mellitus with other specified complication: Secondary | ICD-10-CM | POA: Diagnosis not present

## 2024-02-03 DIAGNOSIS — I10 Essential (primary) hypertension: Secondary | ICD-10-CM

## 2024-02-03 DIAGNOSIS — J449 Chronic obstructive pulmonary disease, unspecified: Secondary | ICD-10-CM

## 2024-02-03 DIAGNOSIS — J9621 Acute and chronic respiratory failure with hypoxia: Secondary | ICD-10-CM | POA: Diagnosis not present

## 2024-02-03 DIAGNOSIS — E876 Hypokalemia: Secondary | ICD-10-CM | POA: Diagnosis not present

## 2024-02-03 DIAGNOSIS — J9622 Acute and chronic respiratory failure with hypercapnia: Secondary | ICD-10-CM | POA: Diagnosis not present

## 2024-02-03 DIAGNOSIS — I5033 Acute on chronic diastolic (congestive) heart failure: Secondary | ICD-10-CM | POA: Diagnosis not present

## 2024-02-03 LAB — GLUCOSE, CAPILLARY
Glucose-Capillary: 164 mg/dL — ABNORMAL HIGH (ref 70–99)
Glucose-Capillary: 203 mg/dL — ABNORMAL HIGH (ref 70–99)
Glucose-Capillary: 238 mg/dL — ABNORMAL HIGH (ref 70–99)
Glucose-Capillary: 273 mg/dL — ABNORMAL HIGH (ref 70–99)

## 2024-02-03 LAB — CBC
HCT: 32 % — ABNORMAL LOW (ref 36.0–46.0)
Hemoglobin: 9.2 g/dL — ABNORMAL LOW (ref 12.0–15.0)
MCH: 26.9 pg (ref 26.0–34.0)
MCHC: 28.8 g/dL — ABNORMAL LOW (ref 30.0–36.0)
MCV: 93.6 fL (ref 80.0–100.0)
Platelets: 261 10*3/uL (ref 150–400)
RBC: 3.42 MIL/uL — ABNORMAL LOW (ref 3.87–5.11)
RDW: 15 % (ref 11.5–15.5)
WBC: 8.4 10*3/uL (ref 4.0–10.5)
nRBC: 0 % (ref 0.0–0.2)

## 2024-02-03 LAB — PROCALCITONIN: Procalcitonin: <0.1 ng/mL

## 2024-02-03 LAB — MAGNESIUM: Magnesium: 2.2 mg/dL (ref 1.7–2.4)

## 2024-02-03 LAB — BASIC METABOLIC PANEL WITH GFR
BUN: 58 mg/dL — ABNORMAL HIGH (ref 6–20)
CO2: >45 mmol/L — ABNORMAL HIGH (ref 22–32)
Calcium: 8.7 mg/dL — ABNORMAL LOW (ref 8.9–10.3)
Chloride: 79 mmol/L — ABNORMAL LOW (ref 98–111)
Creatinine, Ser: 0.9 mg/dL (ref 0.44–1.00)
GFR, Estimated: >60 mL/min (ref >60)
Glucose, Bld: 163 mg/dL — ABNORMAL HIGH (ref 70–99)
Potassium: 3 mmol/L — ABNORMAL LOW (ref 3.5–5.1)
Sodium: 139 mmol/L (ref 135–145)

## 2024-02-03 MED ORDER — SODIUM CHLORIDE 0.9 % IV SOLN
500.0000 mg | INTRAVENOUS | Status: DC
  Administered 2024-02-03 – 2024-02-04 (×2): 500 mg via INTRAVENOUS
  Filled 2024-02-03 (×3): qty 5

## 2024-02-03 MED ORDER — METOLAZONE 5 MG PO TABS
2.5000 mg | ORAL_TABLET | Freq: Once | ORAL | Status: AC
  Administered 2024-02-03: 2.5 mg via ORAL
  Filled 2024-02-03: qty 1

## 2024-02-03 MED ORDER — POTASSIUM CHLORIDE CRYS ER 20 MEQ PO TBCR
40.0000 meq | EXTENDED_RELEASE_TABLET | ORAL | Status: AC
Start: 2024-02-03 — End: 2024-02-03
  Administered 2024-02-03 (×3): 40 meq via ORAL
  Filled 2024-02-03 (×3): qty 2

## 2024-02-03 NOTE — Progress Notes (Signed)
 SLP Cancellation Note  Patient Details Name: Chelsea [name] MRN: [id number] DOB: Mar 06, 1966   Cancelled treatment:       Reason Eval/Treat Not Completed: Medical issues which prohibited therapy (remains on BiPAP). SLP will continue following for medical readiness to participate in a clinical swallowing evaluation.    Amil Kale, M.A., CCC-SLP Speech Language Pathology, Acute Rehabilitation Services  Secure Chat preferred [phone]  02/03/2024, 9:30 AM

## 2024-02-03 NOTE — Evaluation (Signed)
 Clinical/Bedside Swallow Evaluation Patient Details  Name: Chelsea [name] MRN: [id number] Date of Birth: Oct 15, 1965  Today's Date: 02/03/2024 Time: SLP Start Time (ACUTE ONLY): 1604 SLP Stop Time (ACUTE ONLY): 1612 SLP Time Calculation (min) (ACUTE ONLY): 8 min  Past Medical History:  Past Medical History:  Diagnosis Date   Acquired hypothyroidism 11/13/2010   Qualifier: Diagnosis of   By: Audery Blazing, MD, Penney Bowling      Acute blood loss anemia 07/18/2022   Acute congestive heart failure (HCC) 03/18/2021   Acute hypoxemic respiratory failure (HCC) 11/03/2023   Acute hypoxic on chronic hypercapnic respiratory failure (HCC) 09/19/2023   Acute idiopathic gout of left hand 02/16/2019   Acute kidney injury (HCC) 05/29/2023   Acute metabolic encephalopathy 09/29/2023   Acute on chronic diastolic CHF (congestive heart failure), NYHA class 3 (HCC) 09/25/2017   Acute on chronic respiratory failure with hypoxia and hypercapnia (HCC) 04/07/2022   Arthritis    "qwhere" (12/05/2017)   Asthma    Atrial flutter (HCC) 03/09/2021   Bleeding of the respiratory tract 04/07/2022   Bronchitis 05/30/2011   Cervical cancer (HCC) 2006   CHF exacerbation (HCC) 07/30/2022   Chronic heart failure with preserved ejection fraction (HCC) 02/23/2018   Chronic lower back pain    Chronic respiratory failure with hypoxia (HCC) 07/02/2018   ABG 11/2017 7.36/69   Consistent with obesity hypoventilation syndrome   Complication of anesthesia    "I have a hard time waking up from under it" (12/05/2017)   COPD (chronic obstructive pulmonary disease) (HCC) 03/04/2015   Coronary artery disease    Cough productive of clear sputum 06/03/2023   Diarrhea 06/18/2019   DJD (degenerative joint disease) 07/03/2021   DM2 (diabetes mellitus, type 2) (HCC) 11/10/2010   Dyspnea 12/05/2017   Dyspnea on exertion 02/09/2021   Edema 03/22/2011   Essential (primary) hypertension 11/10/2010   Formatting of this note might  be different from the original.  Overview:   Qualifier: Diagnosis of   By: Nadean August      Last Assessment & Plan:   Watch blood pressure off of minoxidil and add medications as needed.   Essential hypertension 11/10/2010   Qualifier: Diagnosis of   By: Nadean August       Gastro-esophageal reflux disease without esophagitis 11/10/2010   GERD 11/10/2010   Qualifier: Diagnosis of   By: Nadean August       Hair loss 09/17/2019   HCAP (healthcare-associated pneumonia) 11/07/2023   Heart failure (HCC) 04/20/2021   Heart murmur    Hepatitis C    History of atrial fibrillation 09/04/2021   History of cervical cancer 03/22/2011   History of gout X 1   History of tobacco use 04/20/2021   History of urinary retention 09/20/2023   Hx of atrial flutter 03/15/2021   Hyperglycemia due to type 2 diabetes mellitus (HCC) 04/20/2021   Hyperlipidemia    Hyperphosphatemia 06/04/2023   Hypertension associated with diabetes (HCC) 08/25/2018   Intertrigo 06/22/2023   Irritable bowel syndrome with diarrhea 06/18/2019   Limitation of activity due to disability 09/18/2021   Lymphedema of both lower extremities 03/22/2011   Metabolic alkalosis 03/28/2021   Microalbuminuria 04/20/2021   Migraine    "monthly" (12/05/2017)   Mild tricuspid regurgitation 04/02/2021   Mitral valve sclerosis 04/02/2021   Mixed hyperlipidemia 04/18/2020   Moderate aortic stenosis 04/20/2021   Mononeuropathy of lower extremity 04/09/2011   Formatting of this note might be different from the original.  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  Severe, corrected by CPAP 18 with C-flex of 3  2/13 bipap 20/14 sm ff mask h/h biflex+3cm  Formatting of this note might be different from the original.  Sleep study 06/2020: IMPRESSIONS  - Moderate obstructive sleep apnea occurred during this study (AHI = 17.7/h), mostly REM related  - No significant central sleep apnea occurred during   On home oxygen  therapy    "2L all the time" (12/05/2017)   On supplemental oxygen  by nasal cannula 04/20/2021   Onychomycosis 06/18/2019   OSA treated with BiPAP    "have CPAP at home too; wearing BiPAP right now" (12/05/2017)   Osteoarthritis 11/28/2010   Qualifier: Diagnosis of   By: Earle Glatter, Scott       Other long term (current) drug therapy 02/12/2022   Pain of left middle finger 06/22/2023   Paroxysmal atrial fibrillation (HCC) 09/04/2021   Physical debility 05/10/2018   Formatting of this note might be different from the original.  Due to morbid obesity   Physical deconditioning 04/24/2021   Pneumonia    "several times" (12/05/2017)   Pneumonia due to gram-positive bacteria 08/24/2017   Pressure injury of both heels, unstageable (HCC) 07/03/2021   Pulmonary edema, acute (HCC) 02/21/2022   Renal insufficiency 04/20/2021   Restless leg syndrome 03/22/2021   Restrictive lung disease secondary to obesity 01/28/2022   S/P hysterectomy 02/19/2021   Tinea pedis of both feet 11/12/2018   Unspecified atrial flutter (HCC) 02/12/2022   Upper GI bleed 07/18/2022   Venous insufficiency of both lower extremities 07/02/2021   Weakness 03/20/2021   Weight loss 03/15/2021   Yeast infection 08/18/2023   Past Surgical History:  Past Surgical History:   Procedure Laterality Date   ABDOMINAL SURGERY  2006 X 2   "TAH incision was infected"   BIOPSY  07/21/2022   Procedure: BIOPSY;  Surgeon: Felecia Hopper, MD;  Location: [facility];  Service: Gastroenterology;;   CHOLECYSTECTOMY OPEN     ESOPHAGOGASTRODUODENOSCOPY N/A 07/21/2022   Procedure: ESOPHAGOGASTRODUODENOSCOPY (EGD);  Surgeon: Felecia Hopper, MD;  Location: [facility];  Service: Gastroenterology;  Laterality: N/A;   TONSILLECTOMY     TOTAL ABDOMINAL HYSTERECTOMY  2006   TRACHEOSTOMY REVISION N/A 04/09/2022   Procedure: CONTROL OF BLEEDING,TRACHEOSTOMY SITE;  Surgeon: Virgina Grills, MD;  Location: [facility];  Service: ENT;  Laterality: N/A;   HPI:  Chelsea [name] is a 58 yo female presenting to ED 4/29 with four days of worsening shortness of breath. Intermittently requiring BiPAP. CT Chest shows multifocal airspace consolidation in the bilateral lower lobes and minimally in the bilateral upper lobes, most confluent in the RLL in additional to multifocal ground-glass opacities throughout the bilateral upper lobes, R>L. Seen by SLP in 2023 s/p bleeding tracheostomy. She underwent an MBS which revealed largely transient penetration with thin  liquids. PMH includes morbid obesity, A-fib, chronic respiratory failure on 5L home O2, HTN, COPD, Hep C, T2DM    Assessment / Plan / Recommendation  Clinical Impression  Pt presents with increased WOB with SpO2 hovering around 90% on 15L HFNC. She denies previous difficulty swallowing. Pt was sitting as upright as her bed allows but positioning remained suboptimal for PO intake. Throat clearance followed all sips of thin liquids. Solids were overall WFL. Discussed completing an MBS given chest imaging and noted signs of dysphagia today. Recommend continuing her current diet in the interim. Hold POs if on BiPAP or if consistently requiring 40LPM HHFNC. Provided education regarding the role of respiratory status and positioning on swallowing.  SLP will f/u next date to proceed with MBS as respiratory status and supplemental O2 requirement allows (cannot travel to radiology if on BiPAP or HHFNC). SLP Visit Diagnosis: Dysphagia, unspecified (R13.10)    Aspiration Risk  Mild aspiration risk    Diet Recommendation Regular;Thin liquid    Liquid Administration via: Cup;Straw Medication Administration: Whole meds with liquid Supervision: Patient able to self feed Compensations: Slow rate;Small sips/bites Postural Changes: Seated upright at 90 degrees;Remain upright for at least 30 minutes after po intake    Other  Recommendations Oral Care Recommendations: Oral care BID    Recommendations for follow up therapy are one component of a multi-disciplinary discharge planning process, led by the attending physician.  Recommendations may be updated based on patient status, additional functional criteria and insurance authorization.  Follow up Recommendations Other (comment) (TBA)      Assistance Recommended at Discharge    Functional Status Assessment Patient has had a recent decline in their functional status and demonstrates the ability to make significant improvements in function in a reasonable and predictable amount of time.  Frequency and Duration min 2x/week  2 weeks       Prognosis Prognosis for improved oropharyngeal function: Good Barriers to Reach Goals: Time post onset      Swallow Study   General HPI: Chelsea [name] is a 58 yo female presenting to ED 4/29 with four days of worsening shortness of breath. Intermittently requiring BiPAP. CT Chest shows multifocal airspace consolidation in the bilateral lower lobes and minimally in the bilateral upper lobes, most confluent in the RLL in additional to multifocal ground-glass opacities throughout the bilateral upper lobes, R>L. Seen by SLP in 2023 s/p bleeding tracheostomy. She underwent an MBS which revealed largely transient penetration with thin liquids. PMH includes  morbid obesity, A-fib, chronic respiratory failure on 5L home O2, HTN, COPD, Hep C, T2DM Type of Study: Bedside Swallow Evaluation Previous Swallow Assessment: see HPI Diet Prior to this Study: Regular;Thin liquids (Level 0) Temperature Spikes Noted: No Respiratory Status: Nasal cannula (15L HFNC) History of Recent Intubation: No Behavior/Cognition: Alert;Cooperative Oral Cavity Assessment: Within Functional Limits Oral Care Completed by SLP: No Oral Cavity - Dentition: Adequate natural dentition Vision: Functional for self-feeding Self-Feeding Abilities: Able to feed self Patient Positioning: Upright in bed Baseline Vocal Quality: Normal Volitional Cough: Strong Volitional Swallow: Able to elicit    Oral/Motor/Sensory Function Overall Oral Motor/Sensory Function: Within functional limits   Ice Chips Ice chips: Not tested   Thin Liquid Thin Liquid: Impaired Presentation: Straw;Self Fed Pharyngeal  Phase Impairments: Throat Clearing - Immediate    Nectar Thick Nectar Thick Liquid: Not tested   Honey Thick Honey Thick Liquid: Not tested   Puree Puree: Not tested   Solid     Solid: Within functional limits  Presentation: Self Fed      Amil Kale, M.A., CCC-SLP Speech Language Pathology, Acute Rehabilitation Services  Secure Chat preferred [phone]  02/03/2024,4:29 PM

## 2024-02-03 NOTE — TOC Progression Note (Signed)
 Transition of Care [facility]) - Progression Note    Patient Details  Name: Chelsea [name] MRN: [id number] Date of Birth: [birth year]  Transition of Care [facility]) CM/SW Contact  Juliane Och, LCSW Phone Number: 02/03/2024, 2:10 PM  Clinical Narrative:     2:10 PM Per hospitalist, patient is expected to return to [facility] Friday. CSW informed SNF RN Rica Chalet.   Expected Discharge Plan: Skilled Nursing Facility Barriers to Discharge: Continued Medical Work up  Expected Discharge Plan and Services In-house Referral: Clinical Social Work     Living arrangements for the past 2 months: Skilled Nursing Facility                                       Social Determinants of Health (SDOH) Interventions SDOH Screenings   Food Insecurity: No Food Insecurity (01/31/2024)  Housing: Low Risk  (01/31/2024)  Transportation Needs: No Transportation Needs (01/31/2024)  Utilities: Not At Risk (01/31/2024)  Alcohol Screen: Low Risk  (07/05/2022)  Financial Resource Strain: Low Risk  (07/05/2022)  Physical Activity: Insufficiently Active (03/27/2021)   Received from [facility], [facility]  Social Connections: Unknown (02/04/2022)   Received from [facility], [facility]  Stress: No Stress Concern Present (09/15/2021)   Received from [facility], [facility]  Tobacco Use: Low Risk  (01/28/2024)    Readmission Risk Interventions    11/15/2023    4:20 PM 07/23/2022    2:19 PM 04/09/2022    4:17 PM  Readmission Risk Prevention Plan  Transportation Screening Complete Complete Complete  PCP or Specialist Appt within 3-5 Days   Complete  HRI or Home Care Consult   Complete  Social Work Consult for Recovery Care Planning/Counseling   Complete  Palliative Care Screening   Not Applicable  Medication Review [facility]) Complete Complete Referral to Pharmacy  PCP or Specialist appointment within 3-5 days of discharge  Complete   HRI or Home Care Consult   Complete   SW Recovery Care/Counseling Consult  Complete   Palliative Care Screening Not Applicable Not Applicable   Skilled Nursing Facility Complete Complete

## 2024-02-03 NOTE — Progress Notes (Addendum)
 Progress Note   Patient: Chelsea [name]:[id number] DOB: Jun 11, 1966 DOA: 01/28/2024     6 DOS: the patient was seen and examined on 02/03/2024   Brief hospital course: Mrs. Ostermiller was admitted to the hospital with the working diagnosis of acute on chronic hypercapnic and hypoxemic respiratory failure.   58 yo female with the past medical history of T2DM, obesity hypoventilation syndrome, hypertension, COPD and obesity class 3 who presented with dyspnea.  Reported worsening symptoms for the last 4 days. Noted recently weaning off alprazolam .  On her initial physical examination her blood pressure was 127/57, HR 73, RR 18 and 02 saturation 85%  Lungs with no wheezing, positive bilateral rales, heart with S1 and S2 present and regular, abdomen with no distention and no lower extremity edema.   Chest radiograph with cardiomegaly with bilateral hilar vascular congestion, bilateral central interstitial infiltrates, with no effusions.   EKG 70 bpm, normal axis, normal intervals qtc 474, sinus rhythm with no significant ST segment changes, negative T wave V1 to V3.   Patient placed on high doses of diuretics for volume overload.  05/02 continue to have high 02 requirements per [place] high flow.  05/03 continue diuresing, slow wean of supplemental 02  05/04 worsening oxygenation, this morning on non re-breather and high flow nasal cannula despite diuresis. CT chest with signs of aspiration pneumonia, started on antibiotic therapy.  05/05 improved work of breathing. Continue diuresis.    Assessment and Plan: * Acute on chronic diastolic CHF (congestive heart failure) (HCC) Echocardiogram with preserved LV systolic function with EF 60 to 65%, RV systolic function preserved, moderate valve aortic stenosis, RVSP 26,6 mmHg, LA and RA not enlarged, no pericardial effusion.   Urine output 2,650 ml  Systolic blood pressure 100 mmHg range  05/02 follow up chest radiograph with improved interstitial  infiltrates, with bibasilar atelectasis  05/04 CT chest with bilateral ground glass opacities, more dense infiltrates at the bases, more right than left lower lobe. Small bilateral pleural effusions.   Continue diuresis with furosemide  60 mg IV tid, spironolactone  and acetazolamide .  05/01 metolazone  2.5 mg 05/05 metolazone  2.5 mg  Continue metoprolol  succinate.   Will hold on SGLT 2 inh due to body habitus and risk of urinary tract infections.   Continue to encourage airway clearing techniques with flutter valve and incentive spirometer.  Out of bed to chair tid with meals   Acute on chronic hypoxemic and hypercapnic respiratory failure. Acute cardiogenic pulmonary edema.  Obesity hypoventilation syndrome  Bilateral lower lobe pneumonia, more right than left. Possible aspiration.  Has been on Bipap 20/8 with 60% Fi02 with good toleration.   Added antibiotic therapy with IV ceftriaxone  Bronchodilator therapy, inhaled corticosteroid. Revefenacin (Yupleri)  Continue Bipap as needed during the day and high flow nasal cannula during the day.  Will try this morning 10 L/min to keel 02 saturation 88% or greater.  Follow up with speech therapy   Atrial flutter with rapid ventricular response (HCC) Continue sinus rhythm in the 60's range bpm.  Continue rate control with metoprolol  (reduced dose to 25 mg to decreased risk of bradycardia) / amiodarone  and anticoagulation with apixaban .  Continue telemetry monitoring.   Hypokalemia AKI Renal function with serum cr at 0,.90 with K a t 3,0 and serum bicarbonate at > 45.  Na 129 and Mg 2.2   Add 40 meq Kcl x 3 doses Continue diuresis, sequential nephron blockade with acetazolamide , furosemide , metolazone  and spironolactone .  Continue diuresis and follow up renal  function and electrolytes.   Type 2 diabetes mellitus with hyperlipidemia (HCC) Continue glucose cover and monitoring with insulin  sliding scale Continue basal insulin  10 units.   Her fasting glucose today is 163 mg/dl. Uncontrolled hyperglycemia.    Continue statin therapy.   Chronic iron  deficiency anemia Cell count has been stable   Hypothyroidism Continue levothyroxine    Obesity, class 3 Calculated BMI is 53.3   Anxiety As needed alprazolam .  Continue with bupropion  (discontinued short acting tid dosing and continue with bid long acting) to prevent encephalopathy.   Acute on chronic back pain, continue with as needed hydrocodone  and low dose IV hydromorphone  for severe pain.        Subjective: Today with improvement in work of breathing, tolerated well bipap last night and this morning transitioned to high flow nasal cannula.   Physical Exam: Vitals:   02/02/24 2039 02/02/24 2346 02/03/24 0102 02/03/24 0437  BP:  105/73  120/65  Pulse: (!) 58 66 61 60  Resp: 20 18 16 19   Temp:  97.9 F (36.6 C)  (!) 96.9 F (36.1 C)  TempSrc:  Axillary  Axillary  SpO2: 100% 94% 95% 96%  Weight:    129.2 kg  Height:       Neurology awake and alert ENT with mild pallor with no icterus Cardiovascular with S1 and S2 present and regular, positive systolic murmur at the apex, with no gallops or rubs Respiratory with mild rales at dependent zones with no wheezing or rhonchi. Anterior auscultation  Abdomen protuberant with no distention  Lower extremity with positive lymphedema, no pitting.  Data Reviewed:    Family Communication: no family at the bedside   Disposition: Status is: Inpatient Remains inpatient appropriate because: respiratory failure   Planned Discharge Destination: Home     Author: Albertus Alt, MD 02/03/2024 9:00 AM  For on call review [email or web address].

## 2024-02-03 NOTE — Progress Notes (Signed)
   NAME:  Chelsea [name], MRN:  [id number], DOB:  11-Dec-1965, LOS: 6 ADMISSION DATE:  01/28/2024, CONSULTATION DATE:  01/29/2024 REFERRING MD:  Lind Repine, MD, CHIEF COMPLAINT:  SOB   History of Present Illness:  58 y/o female with PMH for Morbid obesity, A fib, likely OHS with chronic respiratory failure on 5 lioters home O2, COPD, HTN, Hep C, DMT2 who presented with 4 days of worsening SOB.  No fever/chills.  She was placed on BiPAP in ED for PCO2 of 73 and her SOB.  The BiPAP seemed to help and she was also given Lasix  40mg , Rocephin  and Azithromycin .  Patient c/o LE and foot pain and normally takes Oxycodone  3x/day.    Pertinent  Medical History  orbid obesity, A fib, likely OHS with chronic respiratory failure on 5 lioters home O2, COPD, HTN, Hep C, DMT2  Significant Hospital Events: Including procedures, antibiotic start and stop dates in addition to other pertinent events   4/30: can be admitted to floor/stepdown 5/5: compliant with BiPAP 09/08/59%, RR 15, when sleeping and 15 L [place] when awake. Diuresed 3.6 L in the last 48 hrs  Interim History / Subjective:  Better No CP, cough or wheezing No f/c/r Objective   Blood pressure 118/66, pulse 66, temperature (!) (P) 96.6 F (35.9 C), temperature source (P) Axillary, resp. rate (!) 23, height 5\' 4"  (1.626 m), weight 129.2 kg, SpO2 91%.    Vent Mode: BIPAP FiO2 (%):  [60 %] 60 % Set Rate:  [15 bmp] 15 bmp PEEP:  [8 cmH20] 8 cmH20   Intake/Output Summary (Last 24 hours) at 02/03/2024 1412 Last data filed at 02/03/2024 0500 Gross per 24 hour  Intake 460 ml  Output 2050 ml  Net -1590 ml   Filed Weights   02/01/24 0618 02/02/24 0434 02/03/24 0437  Weight: (!) 162.2 kg (!) 162.8 kg 129.2 kg    Examination: Morbidly obese General: alert, oriented x4, and comfortable. On 15 salter 15 L/min. RA. SpO2 95%  HENT: PERL, normal pharynx and oral mucosa. No LNE or thyromegaly. No JVD Lungs: symmetrical air entry bilaterally.  Diffuse crackles. No wheezing Cardiovascular: NL S1/S2. No m/g/r Abdomen: no distension or tenderness Extremities: +1 edema. Symmetrical. Chronic venous insufficiency  Neuro: nonfocal  Resolved Hospital Problem list   N/a  Assessment & Plan:  Acute on chronic hypercapnic and hypoxic respiratory failure, baseline 5 L [place], due to diastolic CHF exacerbation.  Acute on chronic HFpEF, moderate AS. EF 60% Morbid Obesity OSA/OHS COPD Atrial fibrillation HTN Hep C DM-2 Anemia of chronic illness  - Continue Lasix , aldactone  and fluid restriction - Monitor daily weight and labs   - Changed breztri inhaler to budesonide , yupelri and brovana nebs - Intermittently bipap dependent and wean O2 as tolerated - I/S - Tracheostomy as recommended in prior consultations - Agree with starting antibiotics ceftriaxone , add Zithromax   - Check sputum culture and PCT - Resp viral panel: negative  - Consider speech evaluation to evaluate for possible aspiration  PCCM will continue to follow  Madelynn Schilder, MD [facility] Office: [phone]   See Amion for personal pager PCCM on call pager [phone] until 7pm. Please call Elink 7p-7a. [phone]

## 2024-02-03 NOTE — Plan of Care (Signed)

## 2024-02-04 ENCOUNTER — Inpatient Hospital Stay (HOSPITAL_COMMUNITY)

## 2024-02-04 DIAGNOSIS — J9622 Acute and chronic respiratory failure with hypercapnia: Secondary | ICD-10-CM | POA: Diagnosis not present

## 2024-02-04 DIAGNOSIS — I4892 Unspecified atrial flutter: Secondary | ICD-10-CM | POA: Diagnosis not present

## 2024-02-04 DIAGNOSIS — J449 Chronic obstructive pulmonary disease, unspecified: Secondary | ICD-10-CM | POA: Diagnosis not present

## 2024-02-04 DIAGNOSIS — E1169 Type 2 diabetes mellitus with other specified complication: Secondary | ICD-10-CM | POA: Diagnosis not present

## 2024-02-04 DIAGNOSIS — E876 Hypokalemia: Secondary | ICD-10-CM | POA: Diagnosis not present

## 2024-02-04 DIAGNOSIS — I5033 Acute on chronic diastolic (congestive) heart failure: Secondary | ICD-10-CM | POA: Diagnosis not present

## 2024-02-04 DIAGNOSIS — J9621 Acute and chronic respiratory failure with hypoxia: Secondary | ICD-10-CM | POA: Diagnosis not present

## 2024-02-04 LAB — GLUCOSE, CAPILLARY
Glucose-Capillary: 188 mg/dL — ABNORMAL HIGH (ref 70–99)
Glucose-Capillary: 209 mg/dL — ABNORMAL HIGH (ref 70–99)
Glucose-Capillary: 227 mg/dL — ABNORMAL HIGH (ref 70–99)
Glucose-Capillary: 255 mg/dL — ABNORMAL HIGH (ref 70–99)
Glucose-Capillary: 288 mg/dL — ABNORMAL HIGH (ref 70–99)

## 2024-02-04 LAB — BASIC METABOLIC PANEL WITH GFR
Anion gap: 16 — ABNORMAL HIGH (ref 5–15)
BUN: 60 mg/dL — ABNORMAL HIGH (ref 6–20)
CO2: 42 mmol/L — ABNORMAL HIGH (ref 22–32)
Calcium: 8.1 mg/dL — ABNORMAL LOW (ref 8.9–10.3)
Chloride: 79 mmol/L — ABNORMAL LOW (ref 98–111)
Creatinine, Ser: 0.8 mg/dL (ref 0.44–1.00)
GFR, Estimated: >60 mL/min (ref >60)
Glucose, Bld: 170 mg/dL — ABNORMAL HIGH (ref 70–99)
Potassium: 3.8 mmol/L (ref 3.5–5.1)
Sodium: 137 mmol/L (ref 135–145)

## 2024-02-04 LAB — BLOOD GAS, VENOUS
Acid-Base Excess: 25.3 mmol/L — ABNORMAL HIGH (ref 0.0–2.0)
Bicarbonate: 57.1 mmol/L — ABNORMAL HIGH (ref 20.0–28.0)
Drawn by: 59174
O2 Saturation: 43.6 %
Patient temperature: 36.6
pCO2, Ven: 99 mmHg (ref 44–60)
pH, Ven: 7.37 (ref 7.25–7.43)
pO2, Ven: <31 mmHg — CL (ref 32–45)

## 2024-02-04 LAB — RESPIRATORY PANEL BY PCR

## 2024-02-04 LAB — MAGNESIUM: Magnesium: 2.1 mg/dL (ref 1.7–2.4)

## 2024-02-04 MED ORDER — INSULIN ASPART 100 UNIT/ML IJ SOLN
0.0000 [IU] | INTRAMUSCULAR | Status: DC
  Administered 2024-02-04: 3 [IU] via SUBCUTANEOUS
  Administered 2024-02-04 (×2): 5 [IU] via SUBCUTANEOUS
  Administered 2024-02-05: 2 [IU] via SUBCUTANEOUS
  Administered 2024-02-05: 3 [IU] via SUBCUTANEOUS
  Administered 2024-02-05: 1 [IU] via SUBCUTANEOUS
  Administered 2024-02-05 (×2): 7 [IU] via SUBCUTANEOUS
  Administered 2024-02-06 (×2): 3 [IU] via SUBCUTANEOUS
  Administered 2024-02-06: 2 [IU] via SUBCUTANEOUS
  Administered 2024-02-06: 5 [IU] via SUBCUTANEOUS
  Administered 2024-02-06: 2 [IU] via SUBCUTANEOUS
  Administered 2024-02-07: 7 [IU] via SUBCUTANEOUS
  Administered 2024-02-07: 5 [IU] via SUBCUTANEOUS
  Administered 2024-02-07 (×2): 2 [IU] via SUBCUTANEOUS
  Administered 2024-02-07: 3 [IU] via SUBCUTANEOUS
  Administered 2024-02-07: 1 [IU] via SUBCUTANEOUS
  Administered 2024-02-07 – 2024-02-08 (×2): 7 [IU] via SUBCUTANEOUS
  Administered 2024-02-08 (×2): 1 [IU] via SUBCUTANEOUS
  Administered 2024-02-08: 2 [IU] via SUBCUTANEOUS
  Administered 2024-02-08: 5 [IU] via SUBCUTANEOUS
  Administered 2024-02-09: 9 [IU] via SUBCUTANEOUS
  Administered 2024-02-09: 2 [IU] via SUBCUTANEOUS
  Administered 2024-02-09: 5 [IU] via SUBCUTANEOUS
  Administered 2024-02-09: 3 [IU] via SUBCUTANEOUS
  Administered 2024-02-09: 5 [IU] via SUBCUTANEOUS
  Administered 2024-02-10: 2 [IU] via SUBCUTANEOUS
  Administered 2024-02-10: 5 [IU] via SUBCUTANEOUS
  Administered 2024-02-10: 3 [IU] via SUBCUTANEOUS
  Administered 2024-02-10: 5 [IU] via SUBCUTANEOUS
  Administered 2024-02-11: 3 [IU] via SUBCUTANEOUS
  Administered 2024-02-11: 2 [IU] via SUBCUTANEOUS
  Administered 2024-02-11 (×2): 3 [IU] via SUBCUTANEOUS
  Administered 2024-02-11: 1 [IU] via SUBCUTANEOUS
  Administered 2024-02-11: 5 [IU] via SUBCUTANEOUS
  Administered 2024-02-12: 3 [IU] via SUBCUTANEOUS
  Administered 2024-02-12: 5 [IU] via SUBCUTANEOUS
  Administered 2024-02-12 (×3): 2 [IU] via SUBCUTANEOUS
  Administered 2024-02-12: 5 [IU] via SUBCUTANEOUS
  Administered 2024-02-12: 3 [IU] via SUBCUTANEOUS
  Administered 2024-02-13: 2 [IU] via SUBCUTANEOUS
  Administered 2024-02-13 (×2): 3 [IU] via SUBCUTANEOUS
  Administered 2024-02-13 – 2024-02-14 (×4): 2 [IU] via SUBCUTANEOUS
  Administered 2024-02-14: 3 [IU] via SUBCUTANEOUS

## 2024-02-04 MED ORDER — ACETAZOLAMIDE SODIUM 500 MG IJ SOLR
500.0000 mg | Freq: Two times a day (BID) | INTRAMUSCULAR | Status: DC
  Administered 2024-02-04 – 2024-02-05 (×2): 500 mg via INTRAVENOUS
  Filled 2024-02-04 (×5): qty 500

## 2024-02-04 MED ORDER — INSULIN GLARGINE-YFGN 100 UNIT/ML ~~LOC~~ SOLN
15.0000 [IU] | Freq: Every day | SUBCUTANEOUS | Status: DC
  Administered 2024-02-04: 15 [IU] via SUBCUTANEOUS
  Filled 2024-02-04 (×2): qty 0.15

## 2024-02-04 MED ORDER — METHYLPREDNISOLONE SODIUM SUCC 125 MG IJ SOLR
60.0000 mg | INTRAMUSCULAR | Status: DC
  Administered 2024-02-04 – 2024-02-05 (×2): 60 mg via INTRAVENOUS
  Filled 2024-02-04 (×2): qty 2

## 2024-02-04 MED ORDER — MIDODRINE HCL 5 MG PO TABS
10.0000 mg | ORAL_TABLET | ORAL | Status: AC
  Administered 2024-02-04: 10 mg via ORAL
  Filled 2024-02-04: qty 2

## 2024-02-04 MED ORDER — POTASSIUM CHLORIDE CRYS ER 20 MEQ PO TBCR
40.0000 meq | EXTENDED_RELEASE_TABLET | Freq: Once | ORAL | Status: AC
  Administered 2024-02-04: 40 meq via ORAL
  Filled 2024-02-04: qty 2

## 2024-02-04 NOTE — Progress Notes (Signed)
 PROGRESS NOTE    Chelsea [name]  WUJ:[id number] DOB: 03/09/1966 DOA: 01/28/2024 PCP: Pcp, No  58 y/o female with PMH for Morbid obesity, A fib, likely OHS with chronic respiratory failure on 5 lioters home O2, COPD, HTN, Hep C, DMT2 who presented with 4 days of worsening SOB.  She was placed on BiPAP in ED for PCO2 of 73 and her SOB. The BiPAP seemed to help and she was also given Lasix  40mg , Rocephin  and Azithromycin . Patient c/o LE and foot pain and normally takes Oxycodone  3x/day.   Subjective:   Assessment and Plan:  Acute on chronic diastolic CHF -Echo with EF 60 to 65%, RV systolic function preserved, moderate valve aortic stenosis,  -5/04 CT chest with bilateral ground glass opacities, more dense infiltrates at the bases, Small bilateral pleural effusions.  -diuresis with furosemide  60 mg IV tid, spironolactone  and acetazolamide .  - Continue Toprol   Acute on chronic hypoxemic and hypercapnic respiratory failure. Obesity hypoventilation syndrome  Bilateral lower lobe pneumonia, more right than left. Possible aspiration.  - Continues to have high O2 requirements -Oscillates between BiPAP and heated high flow - IV ceftriaxone  and azithromycin .  Day 4 Bronchodilator therapy, inhaled corticosteroid. Revefenacin (Yupleri)  IV Solu-Medrol  Pulmonary following, low threshold for ICU transfer, tracheostomy  Atrial flutter with rapid ventricular response (HCC) Continue Toprol , amiodarone , apixaban   Hypokalemia AKI -repleted  Type 2 diabetes mellitus  Continue basal insulin  10 units.   Chronic iron  deficiency anemia Cell count has been stable   Hypothyroidism Continue levothyroxine    Obesity, class 3 Calculated BMI is 53.3   Anxiety Continue with as needed alprazolam .  Continue with bupropion    Acute on chronic back pain, continue with as needed hydrocodone , DC hydromorphone     DVT prophylaxis: apixaban  Code Status: Full Code Family Communication: None  present Disposition Plan: Pending stability  Consultants:    Procedures:   Antimicrobials:    Objective: Vitals:   02/04/24 0826 02/04/24 1115 02/04/24 1305 02/04/24 1649  BP: 104/64  (!) 146/100 116/79  Pulse: 70 66 68 69  Resp: (!) 24 (!) 24    Temp: 97.8 F (36.6 C)     TempSrc: Oral     SpO2: (!) 89% 98% 97% 95%  Weight:      Height:        Intake/Output Summary (Last 24 hours) at 02/04/2024 1651 Last data filed at 02/04/2024 1648 Gross per 24 hour  Intake 728.33 ml  Output 3500 ml  Net -2771.67 ml   Filed Weights   02/02/24 0434 02/03/24 0437 02/04/24 0426  Weight: (!) 162.8 kg 129.2 kg 130.5 kg    Examination:      Data Reviewed:   CBC: Recent Labs  Lab 01/28/24 2058 01/29/24 0947 02/03/24 0403  WBC  --  7.1 8.4  HGB 11.2* 9.4* 9.2*  HCT 33.0* 30.8* 32.0*  MCV  --  90.6 93.6  PLT  --  287 261   Basic Metabolic Panel: Recent Labs  Lab 01/28/24 2059 01/29/24 0947 01/30/24 1910 01/31/24 0422 02/01/24 0446 02/02/24 0831 02/03/24 0403 02/04/24 0407  NA  --  140   < > 137 137 136 139 137  K  --  3.0*   < > 3.3* 4.0 3.1* 3.0* 3.8  CL  --  86*   < > 82* 81* 77* 79* 79*  CO2  --  42*   < > 44* 40* 44* >45* 42*  GLUCOSE  --  131*   < > 200* 175*  222* 163* 170*  BUN  --  41*   < > 40* 44* 53* 58* 60*  CREATININE  --  0.98   < > 1.12* 0.97 0.94 0.90 0.80  CALCIUM   --  8.5*   < > 8.5* 8.5* 8.6* 8.7* 8.1*  MG 2.1 2.2  --  2.0 2.1 2.3 2.2 2.1  PHOS 5.8* 5.0*  --   --   --   --   --   --    < > = values in this interval not displayed.   GFR: Estimated Creatinine Clearance: 100.3 mL/min (by C-G formula based on SCr of 0.8 mg/dL). Liver Function Tests: Recent Labs  Lab 01/28/24 1729 01/29/24 0947  AST 12* 12*  ALT 13 11  ALKPHOS 95 90  BILITOT 0.7 0.7  PROT 7.4 7.0  ALBUMIN  2.8* 2.6*   No results for input(s): "LIPASE", "AMYLASE" in the last 168 hours. Recent Labs  Lab 01/28/24 2059  AMMONIA 19   Coagulation Profile: Recent Labs   Lab 01/28/24 2059  INR 1.5*   Cardiac Enzymes: Recent Labs  Lab 01/28/24 2059  CKTOTAL 58   BNP (last 3 results) No results for input(s): "PROBNP" in the last 8760 hours. HbA1C: No results for input(s): "HGBA1C" in the last 72 hours. CBG: Recent Labs  Lab 02/03/24 1641 02/03/24 2110 02/04/24 0617 02/04/24 1301 02/04/24 1553  GLUCAP 238* 273* 188* 227* 209*   Lipid Profile: No results for input(s): "CHOL", "HDL", "LDLCALC", "TRIG", "CHOLHDL", "LDLDIRECT" in the last 72 hours. Thyroid Function Tests: No results for input(s): "TSH", "T4TOTAL", "FREET4", "T3FREE", "THYROIDAB" in the last 72 hours. Anemia Panel: No results for input(s): "VITAMINB12", "FOLATE", "FERRITIN", "TIBC", "IRON ", "RETICCTPCT" in the last 72 hours. Urine analysis:    Component Value Date/Time   COLORURINE STRAW (A) 01/29/2024 1446   APPEARANCEUR CLEAR 01/29/2024 1446   LABSPEC 1.006 01/29/2024 1446   PHURINE 6.0 01/29/2024 1446   GLUCOSEU >=500 (A) 01/29/2024 1446   HGBUR NEGATIVE 01/29/2024 1446   BILIRUBINUR NEGATIVE 01/29/2024 1446   KETONESUR NEGATIVE 01/29/2024 1446   PROTEINUR NEGATIVE 01/29/2024 1446   UROBILINOGEN 0.2 12/08/2010 0545   NITRITE NEGATIVE 01/29/2024 1446   LEUKOCYTESUR NEGATIVE 01/29/2024 1446   Sepsis Labs: @LABRCNTIP (procalcitonin:4,lacticidven:4)  ) Recent Results (from the past 240 hours)  Resp panel by RT-PCR (RSV, Flu A&B, Covid) Anterior Nasal Swab     Status: None   Collection Time: 01/28/24  8:29 PM   Specimen: Anterior Nasal Swab  Result Value Ref Range Status   SARS Coronavirus 2 by RT PCR NEGATIVE NEGATIVE Final   Influenza A by PCR NEGATIVE NEGATIVE Final   Influenza B by PCR NEGATIVE NEGATIVE Final    Comment: (NOTE) The Xpert Xpress SARS-CoV-2/FLU/RSV plus assay is intended as an aid in the diagnosis of influenza from Nasopharyngeal swab specimens and should not be used as a sole basis for treatment. Nasal washings and aspirates are unacceptable  for Xpert Xpress SARS-CoV-2/FLU/RSV testing.  Fact Sheet for Patients: BloggerCourse.com  Fact Sheet for Healthcare Providers: SeriousBroker.it  This test is not yet approved or cleared by the [place]  FDA and has been authorized for detection and/or diagnosis of SARS-CoV-2 by FDA under an Emergency Use Authorization (EUA). This EUA will remain in effect (meaning this test can be used) for the duration of the COVID-19 declaration under Section 564(b)(1) of the Act, 21 U.S.C. section 360bbb-3(b)(1), unless the authorization is terminated or revoked.     Resp Syncytial Virus by PCR NEGATIVE NEGATIVE Final  Comment: (NOTE) Fact Sheet for Patients: BloggerCourse.com  Fact Sheet for Healthcare Providers: SeriousBroker.it  This test is not yet approved or cleared by the [place]  FDA and has been authorized for detection and/or diagnosis of SARS-CoV-2 by FDA under an Emergency Use Authorization (EUA). This EUA will remain in effect (meaning this test can be used) for the duration of the COVID-19 declaration under Section 564(b)(1) of the Act, 21 U.S.C. section 360bbb-3(b)(1), unless the authorization is terminated or revoked.  Performed at [facility], [address] [place], [place] [address]      Radiology Studies: No results found.   Scheduled Meds:  acetaZOLAMIDE   500 mg Intravenous Q12H   amiodarone   200 mg Oral Daily   apixaban   5 mg Oral BID   arformoterol  15 mcg Nebulization BID   atorvastatin   20 mg Oral QPM   budesonide  (PULMICORT ) nebulizer solution  0.5 mg Nebulization BID   buPROPion  ER  100 mg Oral BID   docusate sodium   100 mg Oral BID   furosemide   60 mg Intravenous Q8H   guaiFENesin   600 mg Oral BID   insulin  aspart  0-9 Units Subcutaneous Q4H   insulin  glargine-yfgn  15 Units Subcutaneous QHS   levothyroxine   150 mcg Oral q AM    methylPREDNISolone  (SOLU-MEDROL ) injection  60 mg Intravenous Q24H   metoprolol  succinate  25 mg Oral Daily   montelukast   10 mg Oral QHS   revefenacin  175 mcg Nebulization Daily   senna  1 tablet Oral BID   sodium chloride  flush  3 mL Intravenous Q12H   spironolactone   12.5 mg Oral Daily   Continuous Infusions:  azithromycin  Stopped (02/03/24 1939)   cefTRIAXone  (ROCEPHIN )  IV 2 g (02/04/24 1329)     LOS: 7 days    Time spent:    Deforest Fast, MD [facility]   02/04/2024, 4:51 PM

## 2024-02-04 NOTE — Progress Notes (Addendum)
 Progress Note   Patient: Chelsea [name]:[id number] DOB: June 30, 1966 DOA: 01/28/2024     7 DOS: the patient was seen and examined on 02/04/2024   Brief hospital course: Chelsea [name] was admitted to the hospital with the working diagnosis of acute on chronic hypercapnic and hypoxemic respiratory failure.   58 yo female with the past medical history of T2DM, obesity hypoventilation syndrome, hypertension, COPD and obesity class 3 who presented with dyspnea.  Reported worsening symptoms for the last 4 days. Noted recently weaning off alprazolam .  On her initial physical examination her blood pressure was 127/57, HR 73, RR 18 and 02 saturation 85%  Lungs with no wheezing, positive bilateral rales, heart with S1 and S2 present and regular, abdomen with no distention and no lower extremity edema.   Chest radiograph with cardiomegaly with bilateral hilar vascular congestion, bilateral central interstitial infiltrates, with no effusions.   EKG 70 bpm, normal axis, normal intervals qtc 474, sinus rhythm with no significant ST segment changes, negative T wave V1 to V3.   Patient placed on high doses of diuretics for volume overload.  05/02 continue to have high 02 requirements per [place] high flow.  05/03 continue diuresing, slow wean of supplemental 02  05/04 worsening oxygenation, this morning on non re-breather and high flow nasal cannula despite diuresis. CT chest with signs of aspiration pneumonia, started on antibiotic therapy.  05/05 improved work of breathing. Continue diuresis.  05/06 patient uncomfortable on Bipap, and transitioned to heated  high flow nasal cannula.   Assessment and Plan: * Acute on chronic diastolic CHF (congestive heart failure) (HCC) Echocardiogram with preserved LV systolic function with EF 60 to 65%, RV systolic function preserved, moderate valve aortic stenosis, RVSP 26,6 mmHg, LA and RA not enlarged, no pericardial effusion.   Urine output 1,400 ml  Systolic  blood pressure 100 mmHg range  05/02 follow up chest radiograph with improved interstitial infiltrates, with bibasilar atelectasis  05/04 CT chest with bilateral ground glass opacities, more dense infiltrates at the bases, more right than left lower lobe. Small bilateral pleural effusions.   Continue diuresis with furosemide  60 mg IV tid, spironolactone  and acetazolamide .  05/01 metolazone  2.5 mg 05/05 metolazone  2.5 mg  Continue metoprolol  succinate.   Will hold on SGLT 2 inh due to body habitus and risk of urinary tract infections.   Continue to encourage airway clearing techniques with flutter valve and incentive spirometer.  She has not been out of bed due to rapid 02 desaturation   Acute on chronic hypoxemic and hypercapnic respiratory failure. Acute cardiogenic pulmonary edema.  Obesity hypoventilation syndrome  Bilateral lower lobe pneumonia, more right than left. Possible aspiration.   Today placed on heated high flow nasal cannula with improved in respiratory distress and work of breathing.  40 L./min with 70 % Fi02   Antibiotic therapy with IV ceftriaxone  and azithromycin .  Bronchodilator therapy, inhaled corticosteroid. Revefenacin Chelsea [name])  Add systemic corticosteroids for sever pneumonia  Check VBG  Speech therapy when able per 02 requirements    Continue respiratory support with intermittent bipap and heated high flow nasal cannula.   Atrial flutter with rapid ventricular response (HCC) Continue sinus rhythm in the 60's range bpm.  Continue rate control with metoprolol  (reduced dose to 25 mg to decreased risk of bradycardia) / amiodarone  and anticoagulation with apixaban .  Continue telemetry monitoring.   Hypokalemia AKI  Today serum cr is 0,80 with K at 3,8 and serum bicarbonate at 42  Na 137 and Mg  2.1   Add 40 meq Kcl x 1 dose Continue diuresis, sequential nephron blockade with acetazolamide , furosemide , metolazone  and spironolactone .  Continue diuresis  and follow up renal function and electrolytes.   Type 2 diabetes mellitus with hyperlipidemia (HCC) Continue glucose cover and monitoring with insulin  sliding scale Continue basal insulin  10 units.  Her fasting glucose today is 170 mg/dl. Uncontrolled hyperglycemia.    Continue statin therapy.   Chronic iron  deficiency anemia Cell count has been stable   Hypothyroidism Continue levothyroxine    Obesity, class 3 Calculated BMI is 53.3   Anxiety Continue with as needed alprazolam .  Continue with bupropion  (discontinued short acting tid dosing and continue with bid long acting) to prevent encephalopathy.   Acute on chronic back pain, continue with as needed hydrocodone  and low dose IV hydromorphone  for severe pain.          Subjective: patient continue to have back pain and dyspnea, this morning very  uncomfortable with full face mask Bipap, she has not been out of bed.   Physical Exam: Vitals:   02/04/24 0300 02/04/24 0426 02/04/24 0826 02/04/24 1115  BP: 104/68  104/64   Pulse: 61 62 70 66  Resp: 19 20 (!) 24 (!) 24  Temp: (!) 96.7 F (35.9 C)  97.8 F (36.6 C)   TempSrc: Oral  Oral   SpO2: 93% 94% (!) 89%   Weight:  130.5 kg    Height:       Neurology deconditioned, ill looking appearing, awake  ENT with mild pallor with no icterus Cardiovascular with S1 and S2 present and regular with positive systolic murmur at the base with no gallops  Respiratory with bilateral rales at the dependent zones with no wheezing or rhonchi, anterior auscultation, noted increased work of breathing.  Abdomen protuberant with no distention  Lower extremity with lymphedema, no ankle edema  Data Reviewed:    Family Communication: no family at the bedside   Disposition: Status is: Inpatient Remains inpatient appropriate because: respiratory support   Planned Discharge Destination: Home     Author: Albertus Alt, MD 02/04/2024 11:48 AM  For on call review  [email or web address].

## 2024-02-04 NOTE — Progress Notes (Signed)
 Dr. Glynda Lash, provider on-call, paged regarding pt's hypotension confirmed manually (88/60) and concurrent order for lasix  and pt's request for pain medication. Order received to hold 2200 dose of lasix , administer pt's Vicodin, and to give one time dose of midodrine . Orders implemented. Will continue to monitor.

## 2024-02-04 NOTE — Progress Notes (Signed)
 SLP Cancellation Note  Patient Details Name: Chelsea [name] MRN: [id number] DOB: April 07, 1966   Cancelled treatment:       Reason Eval/Treat Not Completed: Medical issues which prohibited therapy. Pt currently requiring 40 LPM HHFNC. Recommend holding POs if she is consistently requiring 40L or greater. SLP will continue following for medical readiness to complete MBS.     Amil Kale, M.A., CCC-SLP Speech Language Pathology, Acute Rehabilitation Services  Secure Chat preferred [phone]  02/04/2024, 11:17 AM

## 2024-02-04 NOTE — Inpatient Diabetes Management (Signed)
 Inpatient Diabetes Program Recommendations  AACE/ADA: New Consensus Statement on Inpatient Glycemic Control (2015)  Target Ranges:  Prepandial:   less than 140 mg/dL      Peak postprandial:   less than 180 mg/dL (1-2 hours)      Critically ill patients:  140 - 180 mg/dL   Lab Results  Component Value Date   GLUCAP 227 (H) 02/04/2024   HGBA1C 8.1 (H) 01/28/2024    Review of Glycemic Control  Latest Reference Range & Units 02/03/24 06:21 02/03/24 12:02 02/03/24 16:41 02/03/24 21:10 02/04/24 06:17 02/04/24 13:01  Glucose-Capillary 70 - 99 mg/dL 657 (H) 846 (H) 962 (H) 273 (H) 188 (H) 227 (H)  (H): Data is abnormally high  Diabetes history: DM2 Outpatient Diabetes medications: Mounjaro 5 mg weekly, Lantus  20 units every day, Jardiance  25 mg QD Current orders for Inpatient glycemic control: Semglee  10 units at bedtime, Novolog  0-9 units TID and HS, Solumedrol 60 mg Q12H  Inpatient Diabetes Program Recommendations:    Might consider:  Novolog  3 units TID with meals if she consumes at least 50%.  Will continue to follow while inpatient.  Thank you, Hays Lipschutz, MSN, CDCES Diabetes Coordinator Inpatient Diabetes Program [phone] (team pager from 8a-5p)

## 2024-02-04 NOTE — Progress Notes (Signed)
 NAME:  Chelsea [name], MRN:  [id number], DOB:  02-28-1966, LOS: 7 ADMISSION DATE:  01/28/2024, CONSULTATION DATE:  01/29/2024 REFERRING MD:  Lind Repine, MD, CHIEF COMPLAINT:  SOB   History of Present Illness:  58 y/o female with PMH for Morbid obesity, A fib, likely OHS with chronic respiratory failure on 5 lioters home O2, COPD, HTN, Hep C, DMT2 who presented with 4 days of worsening SOB.  No fever/chills.  She was placed on BiPAP in ED for PCO2 of 73 and her SOB.  The BiPAP seemed to help and she was also given Lasix  40mg , Rocephin  and Azithromycin .  Patient c/o LE and foot pain and normally takes Oxycodone  3x/day.    Pertinent  Medical History  orbid obesity, A fib, likely OHS with chronic respiratory failure on 5 lioters home O2, COPD, HTN, Hep C, DMT2  Significant Hospital Events: Including procedures, antibiotic start and stop dates in addition to other pertinent events   4/30: can be admitted to floor/stepdown 5/5: compliant with BiPAP 09/08/59%, RR 15, when sleeping and 15 L salter when awake. Diuresed 3.6 L in the last 48 hrs 5/6: negative 0.5 L fluid balance in the last 24 hrs. On BiPAP 09/08/59%, RR 15, when sleeping and 15 L salter when awake. Poor oral intake. ?I/S use. Started on IV steroids by primary team   Interim History / Subjective:  Stable No CP, cough or wheezing No f/c/r Objective   Blood pressure (!) 146/100, pulse 68, temperature 97.8 F (36.6 C), temperature source Oral, resp. rate (!) 24, height 5\' 4"  (1.626 m), weight 130.5 kg, SpO2 97%.    Vent Mode: BIPAP FiO2 (%):  [60 %-70 %] 60 % Set Rate:  [15 bmp] 15 bmp PEEP:  [8 cmH20] 8 cmH20   Intake/Output Summary (Last 24 hours) at 02/04/2024 1439 Last data filed at 02/04/2024 1203 Gross per 24 hour  Intake 828.33 ml  Output 2000 ml  Net -1171.67 ml   Filed Weights   02/02/24 0434 02/03/24 0437 02/04/24 0426  Weight: (!) 162.8 kg 129.2 kg 130.5 kg    Examination: Morbidly obese General: alert,  oriented x4, and comfortable. On 15 salter 15 L/min. RA. SpO2 95%  HENT: PERL, normal pharynx and oral mucosa. No LNE or thyromegaly. No JVD Lungs: symmetrical air entry bilaterally. Diffuse crackles. No wheezing Cardiovascular: NL S1/S2. No m/g/r Abdomen: no distension or tenderness Extremities: +1 edema. Symmetrical. Chronic venous insufficiency  Neuro: nonfocal  Resolved Hospital Problem list   N/a  Assessment & Plan:  Acute on chronic hypercapnic and hypoxic respiratory failure, baseline 5 L [place], due to diastolic CHF exacerbation.  Acute on chronic HFpEF, moderate AS. EF 60% Morbid Obesity OSA/OHS COPD Atrial fibrillation HTN Hep C DM-2 Anemia of chronic illness  - Continue Lasix , aldactone  and fluid restriction - Add IV Diamox   - Monitor daily weight and labs   - Budesonide , yupelri and brovana nebs - Intermittently bipap dependent and wean O2 as tolerated - I/S - Tracheostomy as recommended in prior consultations - Agree with starting antibiotics ceftriaxone  #2, Zithromax  #2 - Check sputum culture: negative  - PCT: low - Resp viral panel: negative  - Speech evaluation: noted - Dietitian consult - If she is not improving in the coming 24 hrs, will move to ICU, intubate and plan for trach  PCCM will continue to follow  Madelynn Schilder, MD [facility] Office: [phone]   See Amion for personal pager PCCM on call pager [phone]  until 7pm. Please call Elink 7p-7a. [phone]

## 2024-02-04 NOTE — Progress Notes (Signed)
 Patient saturation 79% on HHFNC. Placed patient back on BiPap. Current saturation 95%. Respiratory notified.

## 2024-02-05 DIAGNOSIS — I5033 Acute on chronic diastolic (congestive) heart failure: Secondary | ICD-10-CM | POA: Diagnosis not present

## 2024-02-05 LAB — BASIC METABOLIC PANEL WITH GFR
BUN: 64 mg/dL — ABNORMAL HIGH (ref 6–20)
CO2: >45 mmol/L — ABNORMAL HIGH (ref 22–32)
Calcium: 9.2 mg/dL (ref 8.9–10.3)
Chloride: 78 mmol/L — ABNORMAL LOW (ref 98–111)
Creatinine, Ser: 0.92 mg/dL (ref 0.44–1.00)
GFR, Estimated: >60 mL/min (ref >60)
Glucose, Bld: 235 mg/dL — ABNORMAL HIGH (ref 70–99)
Potassium: 3.4 mmol/L — ABNORMAL LOW (ref 3.5–5.1)
Sodium: 137 mmol/L (ref 135–145)

## 2024-02-05 LAB — CBC
HCT: 28.6 % — ABNORMAL LOW (ref 36.0–46.0)
Hemoglobin: 8.5 g/dL — ABNORMAL LOW (ref 12.0–15.0)
MCH: 26.9 pg (ref 26.0–34.0)
MCHC: 29.7 g/dL — ABNORMAL LOW (ref 30.0–36.0)
MCV: 90.5 fL (ref 80.0–100.0)
Platelets: 284 10*3/uL (ref 150–400)
RBC: 3.16 MIL/uL — ABNORMAL LOW (ref 3.87–5.11)
RDW: 14.9 % (ref 11.5–15.5)
WBC: 8 10*3/uL (ref 4.0–10.5)
nRBC: 0 % (ref 0.0–0.2)

## 2024-02-05 LAB — MAGNESIUM: Magnesium: 2.4 mg/dL (ref 1.7–2.4)

## 2024-02-05 LAB — GLUCOSE, CAPILLARY
Glucose-Capillary: 134 mg/dL — ABNORMAL HIGH (ref 70–99)
Glucose-Capillary: 174 mg/dL — ABNORMAL HIGH (ref 70–99)
Glucose-Capillary: 229 mg/dL — ABNORMAL HIGH (ref 70–99)
Glucose-Capillary: 293 mg/dL — ABNORMAL HIGH (ref 70–99)
Glucose-Capillary: 315 mg/dL — ABNORMAL HIGH (ref 70–99)
Glucose-Capillary: 348 mg/dL — ABNORMAL HIGH (ref 70–99)

## 2024-02-05 MED ORDER — PROSOURCE PLUS PO LIQD
30.0000 mL | Freq: Two times a day (BID) | ORAL | Status: DC
  Administered 2024-02-05: 30 mL via ORAL
  Filled 2024-02-05 (×5): qty 30

## 2024-02-05 MED ORDER — POTASSIUM CHLORIDE 10 MEQ/100ML IV SOLN
10.0000 meq | INTRAVENOUS | Status: AC
  Administered 2024-02-05 (×2): 10 meq via INTRAVENOUS
  Filled 2024-02-05 (×2): qty 100

## 2024-02-05 MED ORDER — POTASSIUM CHLORIDE CRYS ER 20 MEQ PO TBCR
40.0000 meq | EXTENDED_RELEASE_TABLET | Freq: Once | ORAL | Status: AC
  Administered 2024-02-05: 40 meq via ORAL
  Filled 2024-02-05: qty 2

## 2024-02-05 MED ORDER — FUROSEMIDE 10 MG/ML IJ SOLN
20.0000 mg | Freq: Once | INTRAMUSCULAR | Status: AC
  Administered 2024-02-05: 20 mg via INTRAVENOUS
  Filled 2024-02-05: qty 2

## 2024-02-05 MED ORDER — ROPINIROLE HCL 1 MG PO TABS
0.5000 mg | ORAL_TABLET | Freq: Every day | ORAL | Status: DC
Start: 2024-02-06 — End: 2024-02-19
  Administered 2024-02-05 – 2024-02-17 (×13): 0.5 mg via ORAL
  Filled 2024-02-05 (×13): qty 1

## 2024-02-05 MED ORDER — STERILE WATER FOR INJECTION IJ SOLN
INTRAMUSCULAR | Status: AC
  Administered 2024-02-05: 5 mL
  Filled 2024-02-05: qty 10

## 2024-02-05 MED ORDER — GLUCERNA SHAKE PO LIQD
237.0000 mL | Freq: Three times a day (TID) | ORAL | Status: DC
  Administered 2024-02-07 – 2024-02-18 (×28): 237 mL via ORAL
  Filled 2024-02-05 (×4): qty 237

## 2024-02-05 MED ORDER — AZITHROMYCIN 500 MG PO TABS
500.0000 mg | ORAL_TABLET | Freq: Once | ORAL | Status: AC
Start: 2024-02-05 — End: 2024-02-05
  Administered 2024-02-05: 500 mg via ORAL
  Filled 2024-02-05: qty 1

## 2024-02-05 MED ORDER — INSULIN GLARGINE-YFGN 100 UNIT/ML ~~LOC~~ SOLN
10.0000 [IU] | Freq: Two times a day (BID) | SUBCUTANEOUS | Status: DC
  Administered 2024-02-05 – 2024-02-06 (×3): 10 [IU] via SUBCUTANEOUS
  Filled 2024-02-05 (×4): qty 0.1

## 2024-02-05 NOTE — Progress Notes (Signed)
 RT went to place patient on BiPAP.  Patient states she is not ready and will call when ready.

## 2024-02-05 NOTE — Progress Notes (Signed)
 Initial Nutrition Assessment  DOCUMENTATION CODES:   Morbid obesity  INTERVENTION:  Continue carb modified diet as ordered Encourage adequate PO intake Glucerna Shake po TID, each supplement provides 220 kcal and 10 grams of protein 30 ml ProSource Plus BID, each supplement provides 100 kcals and 15 grams protein.   NUTRITION DIAGNOSIS:   Increased nutrient needs related to acute illness as evidenced by estimated needs.  GOAL:   Patient will meet greater than or equal to 90% of their needs  MONITOR:   PO intake, Supplement acceptance, Labs, Weight trends, I & O's  REASON FOR ASSESSMENT:   Consult Assessment of nutrition requirement/status  ASSESSMENT:   Pt admitted with acute on chronic hypercapnic and hypoxemic respiratory failure. PMH significant for T2DM, obesity hypoventilation syndrome, hypertension, COPD and obesity.  Pt familiar to RD from prior admission. Remains inpatient for medical management of respiratory failure.  Pt with multiple prior admissions within the last year d/t respiratory failure.   Pt preparing to be cleaned following bowel movement therefore nutrition related history is limited.  Pt reports that her appetite has been very poor for "a while" but cannot quantify how long.   Meal completions appear to have declined slightly since 5/3. She denies difficulty chewing/swallowing however noted SLP following and plans for MBS pending readiness for assessment given given chest imaging and signs of dysphagia on bedside exam on 5/5.    5/3: 100% breakfast, 100% lunch, 100% dinner 5/5: 100% breakfast, 50% lunch, 100% dinner 5/6: 75% breakfast, 30% dinner  Pt is amenable to receive protein supplements to augment intake during admission.   Admit weight: 141.1 kg (?likely stated) Current weight: 158.9 kg  Uncertain the accuracy of current weight as weights throughout admission have been widely variable, making it difficult to follow any true weight trends.    Medications: diamox , colace BID, lasix  60mg  q8h, SSI 0-9 units q4h, semglee  10 units BID, solu-medrol , senna BID, IV abx  Labs:  Potassium 3.4 BUN 64 CBG's 134-288 x24 hours  UOP: 4.1L x24 hours I/O's: -13,921ml since admit  Diet Order:   Diet Order             Diet Carb Modified Fluid consistency: Thin; Room service appropriate? Yes with Assist; Fluid restriction: 1200 mL Fluid  Diet effective now                   EDUCATION NEEDS:   Education needs have been addressed  Skin:  Skin Assessment: Reviewed RN Assessment  Last BM:  5/7 x2 large, type 5, type 6  Height:   Ht Readings from Last 1 Encounters:  01/28/24 5\' 4"  (1.626 m)    Weight:   Wt Readings from Last 1 Encounters:  02/05/24 (!) 158.9 kg    Ideal Body Weight:  54.5 kg  BMI:  Body mass index is 60.13 kg/m.  Estimated Nutritional Needs:   Kcal:  1700-1900  Protein:  95-110g  Fluid:  >/=1.7L  Rocklin Chute, RDN, LDN Clinical Nutrition See AMiON for contact information.

## 2024-02-05 NOTE — Plan of Care (Signed)
  Problem: Clinical Measurements: Goal: Respiratory complications will improve Outcome: Progressing   Problem: Pain Managment: Goal: General experience of comfort will improve and/or be controlled Outcome: Progressing   Problem: Safety: Goal: Ability to remain free from injury will improve Outcome: Progressing   Problem: Skin Integrity: Goal: Risk for impaired skin integrity will decrease Outcome: Progressing

## 2024-02-05 NOTE — Progress Notes (Signed)
 NAME:  Chelsea [name], MRN:  [id number], DOB:  1966-04-17, LOS: 8 ADMISSION DATE:  01/28/2024, CONSULTATION DATE:  01/29/2024 REFERRING MD:  Lind Repine, MD, CHIEF COMPLAINT:  SOB   History of Present Illness:  58 y/o female with PMH for Morbid obesity, A fib, likely OHS with chronic respiratory failure on 5 lioters home O2, COPD, HTN, Hep C, DMT2 who presented with 4 days of worsening SOB.  No fever/chills.  She was placed on BiPAP in ED for PCO2 of 73 and her SOB.  The BiPAP seemed to help and she was also given Lasix  40mg , Rocephin  and Azithromycin .  Patient c/o LE and foot pain and normally takes Oxycodone  3x/day.    Pertinent  Medical History  orbid obesity, A fib, likely OHS with chronic respiratory failure on 5 lioters home O2, COPD, HTN, Hep C, DMT2  Significant Hospital Events: Including procedures, antibiotic start and stop dates in addition to other pertinent events   4/30: can be admitted to floor/stepdown 5/5: compliant with BiPAP 09/08/59%, RR 15, when sleeping and 15 L salter when awake. Diuresed 3.6 L in the last 48 hrs 5/6: negative 0.5 L fluid balance in the last 24 hrs. On BiPAP 09/08/59%, RR 15, when sleeping and 15 L salter when awake. Poor oral intake. ?I/S use. Started on IV steroids by primary team  5/7: negative 2.4 L fluid balance in the last 24 hrs. On HFNC 40L/58%, BiPAP PRN 09/07/49%, RR 15. Feels better and uses the I/S  Interim History / Subjective:  Stable No CP, cough or wheezing No f/c/r Objective   Blood pressure 99/64, pulse 71, temperature 98.2 F (36.8 C), temperature source Oral, resp. rate 15, height 5\' 4"  (1.626 m), weight (!) 158.9 kg, SpO2 96%.    FiO2 (%):  [50 %-72 %] 60 % PEEP:  [8 cmH20] 8 cmH20 Pressure Support:  [4 cmH20] 4 cmH20   Intake/Output Summary (Last 24 hours) at 02/05/2024 1300 Last data filed at 02/05/2024 1100 Gross per 24 hour  Intake 1262.06 ml  Output 4125 ml  Net -2862.94 ml   Filed Weights   02/03/24 0437 02/04/24  0426 02/05/24 0444  Weight: 129.2 kg 130.5 kg (!) 158.9 kg    Examination: Morbidly obese General: alert, oriented x4, and comfortable. SpO2 95%  HENT: PERL, normal pharynx and oral mucosa. No LNE or thyromegaly. No JVD Lungs: symmetrical air entry bilaterally. Less diffuse crackles. No wheezing Cardiovascular: NL S1/S2. No m/g/r Abdomen: no distension or tenderness Extremities: +1 edema. Symmetrical. Chronic venous insufficiency  Neuro: nonfocal  Resolved Hospital Problem list   N/a  Assessment & Plan:  Acute on chronic hypercapnic and hypoxic respiratory failure, baseline 5 L [place], due to diastolic CHF exacerbation.  Acute on chronic HFpEF, moderate AS. EF 60% Morbid Obesity OSA/OHS COPD Atrial fibrillation HTN Hep C DM-2 Anemia of chronic illness HypoK  - Continue Lasix , aldactone , Diamox  and fluid restriction - Monitor daily weight and labs   - Replace K - Budesonide , yupelri and brovana nebs - Intermittently bipap dependent and wean O2 as tolerated - I/S - Tracheostomy as recommended in prior consultations, but she declined  - Agree with starting antibiotics ceftriaxone  #3, Zithromax  #3 - Check sputum culture: negative  - PCT: low - Resp viral panel: negative  - Speech evaluation: noted - Dietitian consult - Increase Lantus , on steroids per primary team - PT/OT  PCCM will continue to follow  Madelynn Schilder, MD [facility] Office: [phone]   See  Amion for personal pager PCCM on call pager [phone] until 7pm. Please call Elink 7p-7a. [phone]

## 2024-02-06 DIAGNOSIS — I5033 Acute on chronic diastolic (congestive) heart failure: Secondary | ICD-10-CM | POA: Diagnosis not present

## 2024-02-06 LAB — COMPREHENSIVE METABOLIC PANEL WITH GFR
ALT: 9 U/L (ref 0–44)
AST: 12 U/L — ABNORMAL LOW (ref 15–41)
Albumin: 2.8 g/dL — ABNORMAL LOW (ref 3.5–5.0)
Alkaline Phosphatase: 73 U/L (ref 38–126)
Anion gap: 14 (ref 5–15)
BUN: 70 mg/dL — ABNORMAL HIGH (ref 6–20)
CO2: 41 mmol/L — ABNORMAL HIGH (ref 22–32)
Calcium: 8.8 mg/dL — ABNORMAL LOW (ref 8.9–10.3)
Chloride: 81 mmol/L — ABNORMAL LOW (ref 98–111)
Creatinine, Ser: 1.04 mg/dL — ABNORMAL HIGH (ref 0.44–1.00)
GFR, Estimated: >60 mL/min (ref >60)
Glucose, Bld: 203 mg/dL — ABNORMAL HIGH (ref 70–99)
Potassium: 3.5 mmol/L (ref 3.5–5.1)
Sodium: 136 mmol/L (ref 135–145)
Total Bilirubin: 0.6 mg/dL (ref 0.0–1.2)
Total Protein: 7.1 g/dL (ref 6.5–8.1)

## 2024-02-06 LAB — GLUCOSE, CAPILLARY
Glucose-Capillary: 215 mg/dL — ABNORMAL HIGH (ref 70–99)
Glucose-Capillary: 161 mg/dL — ABNORMAL HIGH (ref 70–99)
Glucose-Capillary: 175 mg/dL — ABNORMAL HIGH (ref 70–99)
Glucose-Capillary: 220 mg/dL — ABNORMAL HIGH (ref 70–99)

## 2024-02-06 LAB — CBC
HCT: 28.3 % — ABNORMAL LOW (ref 36.0–46.0)
Hemoglobin: 8.3 g/dL — ABNORMAL LOW (ref 12.0–15.0)
MCH: 26.9 pg (ref 26.0–34.0)
MCHC: 29.3 g/dL — ABNORMAL LOW (ref 30.0–36.0)
MCV: 91.6 fL (ref 80.0–100.0)
Platelets: 314 10*3/uL (ref 150–400)
RBC: 3.09 MIL/uL — ABNORMAL LOW (ref 3.87–5.11)
RDW: 15.3 % (ref 11.5–15.5)
WBC: 7.6 10*3/uL (ref 4.0–10.5)
nRBC: 0 % (ref 0.0–0.2)

## 2024-02-06 MED ORDER — MIDODRINE HCL 5 MG PO TABS
5.0000 mg | ORAL_TABLET | Freq: Three times a day (TID) | ORAL | Status: DC
  Administered 2024-02-06 – 2024-02-18 (×37): 5 mg via ORAL
  Filled 2024-02-06 (×37): qty 1

## 2024-02-06 MED ORDER — INSULIN ASPART 100 UNIT/ML IJ SOLN
3.0000 [IU] | Freq: Three times a day (TID) | INTRAMUSCULAR | Status: DC
  Administered 2024-02-06 – 2024-02-08 (×7): 3 [IU] via SUBCUTANEOUS

## 2024-02-06 MED ORDER — PREDNISONE 20 MG PO TABS
40.0000 mg | ORAL_TABLET | Freq: Every day | ORAL | Status: AC
  Administered 2024-02-07 – 2024-02-09 (×3): 40 mg via ORAL
  Filled 2024-02-06 (×3): qty 2

## 2024-02-06 MED ORDER — OLOPATADINE HCL 0.1 % OP SOLN
1.0000 [drp] | Freq: Two times a day (BID) | OPHTHALMIC | Status: DC
  Administered 2024-02-06 – 2024-02-18 (×24): 1 [drp] via OPHTHALMIC
  Filled 2024-02-06 (×2): qty 5

## 2024-02-06 MED ORDER — POTASSIUM CHLORIDE 20 MEQ PO PACK
40.0000 meq | PACK | Freq: Every day | ORAL | Status: DC
  Administered 2024-02-06: 40 meq via ORAL
  Filled 2024-02-06: qty 2

## 2024-02-06 MED ORDER — INSULIN GLARGINE-YFGN 100 UNIT/ML ~~LOC~~ SOLN
13.0000 [IU] | Freq: Two times a day (BID) | SUBCUTANEOUS | Status: DC
  Administered 2024-02-06 – 2024-02-15 (×18): 13 [IU] via SUBCUTANEOUS
  Filled 2024-02-06 (×19): qty 0.13

## 2024-02-06 MED ORDER — METOPROLOL SUCCINATE ER 25 MG PO TB24
12.5000 mg | ORAL_TABLET | Freq: Every day | ORAL | Status: DC
Start: 2024-02-07 — End: 2024-02-19
  Administered 2024-02-07 – 2024-02-18 (×11): 12.5 mg via ORAL
  Filled 2024-02-06 (×12): qty 1

## 2024-02-06 MED ORDER — ACETAZOLAMIDE 250 MG PO TABS
500.0000 mg | ORAL_TABLET | Freq: Two times a day (BID) | ORAL | Status: AC
  Administered 2024-02-06 – 2024-02-07 (×3): 500 mg via ORAL
  Filled 2024-02-06 (×3): qty 2

## 2024-02-06 NOTE — Evaluation (Signed)
 Physical Therapy Evaluation Patient Details Name: Chelsea [name] MRN: [id number] DOB: Apr 20, 1966 Today's Date: 02/06/2024  History of Present Illness  Pt is a 58 y.o. female presenting 4/29 with increased shortness of breath. Admitted for Acute on chronic hypoxic and hypercarbic respiratory failure , acute on chronic diastolic CHF exacerbation. 5/4 CT chest with B ground glass opacities, small B pleural effusions, B LL PNA. PMH significant for DMII, morbid obesity, obesity hypoventilation syndrome, heart failure, chronic respiratory failure on 5L baseline and BiPAP at night, HTN, hep C, COPD, afib, CHF.   Clinical Impression  Acute PT re-consulted for PT eval to assess need for acute PT. Prior to admit, pt used a hoyer lift to get to a WC 2-3x a week. However, pt reported she has not been out of the bed for over a month. Pt had BM upon arrival and required TotalAx2 to MaxAx2 to perform multiple rolls bilaterally. Pt was minimally able to assist with reaching for opposite hand rail. Throughout the session, pt needed encouragement to take initiative with mobility instead of relying on staff. Pt declined sitting on the EOB or further mobility. Pt presents at functional mobility baseline and has no further acute PT needs. Pt also reported not wanting to have PT while in the hospital. Recommending return to [facility] once medically stable. Acute PT signing off.       If plan is discharge home, recommend the following: Two people to help with walking and/or transfers;Assist for transportation;Assistance with cooking/housework;A lot of help with bathing/dressing/bathroom   Can travel by private vehicle    No    Equipment Recommendations None recommended by PT     Functional Status Assessment Patient has not had a recent decline in their functional status     Precautions / Restrictions Precautions Precautions: Fall Restrictions Weight Bearing Restrictions Per Provider Order: No       Mobility  Bed Mobility Overal bed mobility: Needs Assistance Bed Mobility: Rolling Rolling: Max assist, Total assist, +2 for physical assistance, +2 for safety/equipment, Used rails    General bed mobility comments: TotalAx2 to MaxAx2 to perform mulitple rolls for pericare and linen change. Pt declined further mobility    Transfers    General transfer comment: Pt declined         Pertinent Vitals/Pain Pain Assessment Pain Assessment: Faces Faces Pain Scale: Hurts whole lot Pain Location: legs Pain Descriptors / Indicators: Aching, Discomfort, Grimacing Pain Intervention(s): Limited activity within patient's tolerance, Monitored during session, Repositioned    Home Living Family/patient expects to be discharged to:: Skilled nursing facility       Additional Comments: Has been in and  out of [facility] and having assist with all ADLS. only ben getting to sitting to the EOB for a month    Prior Function Prior Level of Function : Needs assist       Physical Assist : Mobility (physical);ADLs (physical) Mobility (physical): Bed mobility;Transfers   Mobility Comments: Used hoyer lift OOB to WC 2-3x a week. Over past month, has not been getting OOB ADLs Comments: Was doing some ADLs at bed level with needing assist for majority of ADLs     Extremity/Trunk Assessment   Upper Extremity Assessment Upper Extremity Assessment: Defer to OT evaluation    Lower Extremity Assessment Lower Extremity Assessment: Generalized weakness    Cervical / Trunk Assessment Cervical / Trunk Assessment: Other exceptions (increased body habitus)  Communication   Communication Communication: No apparent difficulties    Cognition Arousal: Alert  Behavior During Therapy: Restless, Agitated, Anxious   PT - Cognitive impairments: No apparent impairments    Following commands: Intact       Cueing Cueing Techniques: Verbal cues      PT Assessment Patient does not need any  further PT services         PT Goals (Current goals can be found in the Care Plan section)  Acute Rehab PT Goals PT Goal Formulation: All assessment and education complete, DC therapy     AM-PAC PT "6 Clicks" Mobility  Outcome Measure Help needed turning from your back to your side while in a flat bed without using bedrails?: Total Help needed moving from lying on your back to sitting on the side of a flat bed without using bedrails?: Total Help needed moving to and from a bed to a chair (including a wheelchair)?: Total Help needed standing up from a chair using your arms (e.g., wheelchair or bedside chair)?: Total Help needed to walk in hospital room?: Total Help needed climbing 3-5 steps with a railing? : Total 6 Click Score: 6    End of Session Equipment Utilized During Treatment: Oxygen  Activity Tolerance: Patient limited by pain;Other (comment) (self-limits mobility) Patient left: in bed;with bed alarm set;with call bell/phone within reach;with nursing/sitter in room Nurse Communication: Mobility status;Need for lift equipment      Time: [phone] PT Time Calculation (min) (ACUTE ONLY): 27 min   Charges:   PT Evaluation $PT Eval Low Complexity: 1 Low PT Treatments $Therapeutic Activity: 8-22 mins PT General Charges $$ ACUTE PT VISIT: 1 Visit       Chelsea [name], PT, DPT Secure Chat Preferred  Rehab Office [phone]   Chelsea [name] 02/06/2024, 4:24 PM

## 2024-02-06 NOTE — Progress Notes (Signed)
 Patient currently on Bipap saturation 95%. Called respiratory and requested to put patient on [facility], per MD.  Catarino Clines, RN

## 2024-02-06 NOTE — Progress Notes (Signed)
 Pts BP 97/61. HR 67. Pt is now on HHFNC 40L/min. MD notified. Diamox  and lasix  given per MD request.

## 2024-02-06 NOTE — Inpatient Diabetes Management (Signed)
 Inpatient Diabetes Program Recommendations  AACE/ADA: New Consensus Statement on Inpatient Glycemic Control   Target Ranges:  Prepandial:   less than 140 mg/dL      Peak postprandial:   less than 180 mg/dL (1-2 hours)      Critically ill patients:  140 - 180 mg/dL    Latest Reference Range & Units 02/05/24 08:10 02/05/24 12:36 02/05/24 15:43 02/05/24 21:10 02/05/24 23:51 02/06/24 03:19 02/06/24 08:07  Glucose-Capillary 70 - 99 mg/dL 161 (H) 096 (H) 045 (H) 348 (H) 293 (H) 215 (H) 161 (H)   Review of Glycemic Control  Diabetes history: DM2 Outpatient Diabetes medications: Jardiance  25 mg QAM, Lantus  15 units QAM, Mounjaro 5 mg Qweek (Wednesday) Current orders for Inpatient glycemic control: Semglee  10 units BID, Novolog  0-9 units Q4H; Solumedrol 60 Q24H  Inpatient Diabetes Program Recommendations:    Insulin : If steroids are continued as ordered, please consider increasing Semglee  to 13 units BID and ordering Novolog  4 units TID with meals for meal coverage if patient eats at least 50% of meals.  Thanks, Beacher Limerick, RN, MSN, CDCES Diabetes Coordinator Inpatient Diabetes Program [phone] (Team Pager from 8am to 5pm)

## 2024-02-06 NOTE — Progress Notes (Addendum)
 SLP Cancellation Note  Patient Details Name: Chelsea [name] MRN: [id number] DOB: 1966/02/26   Cancelled treatment:       Reason Eval/Treat Not Completed: Medical issues which prohibited therapy (remains on BiPAP). Recommend holding POs any time pt is on BiPAP or consistently requiring 40L or greater HHFNC. Discussed with MD who is in agreement with SLP signing off. Please re-consult when pt is ready to participate in an instrumental swallow study if clinically indicated.    Amil Kale, M.A., CCC-SLP Speech Language Pathology, Acute Rehabilitation Services  Secure Chat preferred [phone]  02/06/2024, 10:11 AM

## 2024-02-06 NOTE — Care Management Important Message (Signed)
 Important Message  Patient Details  Name: Chelsea [name] MRN: [id number] Date of Birth: 05-05-1966   Important Message Given:  Yes - Medicare IM     Felix Host 02/06/2024, 11:16 AM

## 2024-02-06 NOTE — Progress Notes (Signed)
 NAME:  Chelsea [name], MRN:  [id number], DOB:  08-27-1966, LOS: 9 ADMISSION DATE:  01/28/2024, CONSULTATION DATE:  01/29/2024 REFERRING MD:  Lind Repine, MD, CHIEF COMPLAINT:  SOB   History of Present Illness:  58 y/o female with PMH for Morbid obesity, A fib, likely OHS with chronic respiratory failure on 5 lioters home O2, COPD, HTN, Hep C, DMT2 who presented with 4 days of worsening SOB.  No fever/chills.  She was placed on BiPAP in ED for PCO2 of 73 and her SOB.  The BiPAP seemed to help and she was also given Lasix  40mg , Rocephin  and Azithromycin .  Patient c/o LE and foot pain and normally takes Oxycodone  3x/day.    Pertinent  Medical History  orbid obesity, A fib, likely OHS with chronic respiratory failure on 5 lioters home O2, COPD, HTN, Hep C, DMT2  Significant Hospital Events: Including procedures, antibiotic start and stop dates in addition to other pertinent events   4/30: can be admitted to floor/stepdown 5/5: compliant with BiPAP 09/08/59%, RR 15, when sleeping and 15 L [place] when awake. Diuresed 3.6 L in the last 48 hrs 5/6: negative 0.5 L fluid balance in the last 24 hrs. On BiPAP 09/08/59%, RR 15, when sleeping and 15 L [place] when awake. Poor oral intake. ?I/S use. Started on IV steroids by primary team  5/7: negative 2.4 L fluid balance in the last 24 hrs. On HFNC 40L/58%, BiPAP PRN 09/07/49%, RR 15. Feels better and uses the I/S 5/8: negative 2.1 L fluid balance in the last 24 hrs. On HFNC 40L/60%, BiPAP PRN 09/07/49%, RR 15. Feels better and uses the I/S. She has itchy eyes.  Interim History / Subjective:  Stable No CP, cough or wheezing No f/c/r Objective   Blood pressure 109/64, pulse (!) 55, temperature 97.8 F (36.6 C), temperature source Oral, resp. rate 18, height 5\' 4"  (1.626 m), weight (!) 156.5 kg, SpO2 96%.    Vent Mode: BIPAP;PCV FiO2 (%):  [40 %-65 %] 60 % Set Rate:  [15 bmp] 15 bmp PEEP:  [8 cmH20] 8 cmH20 Pressure Support:  [4 cmH20] 4 cmH20    Intake/Output Summary (Last 24 hours) at 02/06/2024 1222 Last data filed at 02/06/2024 0742 Gross per 24 hour  Intake 943 ml  Output 2900 ml  Net -1957 ml   Filed Weights   02/04/24 0426 02/05/24 0444 02/06/24 0100  Weight: 130.5 kg (!) 158.9 kg (!) 156.5 kg    Examination: Morbidly obese General: alert, oriented x4, and comfortable. SpO2 95%  HENT: PERL, normal pharynx and oral mucosa. No LNE or thyromegaly. No JVD Lungs: symmetrical air entry bilaterally. Less diffuse crackles. No wheezing Cardiovascular: NL S1/S2. No m/g/r Abdomen: no distension or tenderness Extremities: no edema. Symmetrical. Chronic venous insufficiency  Neuro: nonfocal  Resolved Hospital Problem list   N/a  Assessment & Plan:  Acute on chronic hypercapnic and hypoxic respiratory failure, baseline 5 L [place], due to diastolic CHF exacerbation.  Acute on chronic HFpEF, moderate AS. EF 60% Morbid Obesity OSA/OHS COPD Atrial fibrillation HTN Hep C DM-2 Anemia of chronic illness HypoK Itchy eyes  - Continue Lasix , aldactone , Diamox  and fluid restriction - Monitor daily weight and labs   - Replace K - Budesonide , yupelri and brovana nebs - Intermittently bipap dependent and wean O2 as tolerated - I/S - Tracheostomy as recommended in prior consultations, but she declined  - Agree with starting antibiotics ceftriaxone  #4, Zithromax  #3 (finished) - Check sputum culture: negative  - PCT:  low - Resp viral panel: negative  - Speech evaluation: noted - Dietitian consult - Lantus , on steroids per primary team - Change IV Solumedrol to PO for 3 days  - PT/OT - Olopatadine eye drops  PCCM will continue to follow  Madelynn Schilder, MD [facility] Office: [phone]   See Amion for personal pager PCCM on call pager [phone] until 7pm. Please call Elink 7p-7a. [phone]

## 2024-02-06 NOTE — Progress Notes (Signed)
 PROGRESS NOTE    Chelsea [name]  ION:[id number] DOB: Feb 08, 1966 DOA: 01/28/2024 PCP: Pcp, No  58 y/o female with PMH for Morbid obesity, A fib, likely OHS with chronic respiratory failure on 5 lioters home O2, COPD, HTN, Hep C, DMT2 who presented with 4 days of worsening SOB.  She was placed on BiPAP in ED for PCO2 of 73 and her SOB. The BiPAP seemed to help and she was also given Lasix  40mg , Rocephin  and Azithromycin . Patient c/o LE and foot pain and normally takes Oxycodone  3x/day.   Subjective: Stayed off BiPAP for several hours yesterday morning - No events overnight  Assessment and Plan:  Acute on chronic diastolic CHF -Echo with EF 60 to 65%, RV systolic function preserved, moderate valve aortic stenosis,  -5/04 CT chest with bilateral ground glass opacities, more dense infiltrates at the bases, Small bilateral pleural effusions.  - She is 15.6 L negative, continue furosemide  60 mg IV tid, spironolactone   - Cut down Toprol  dose for bradycardia  Acute on chronic hypoxemic and hypercapnic respiratory failure. Obesity hypoventilation syndrome  Bilateral lower lobe pneumonia, more right than left. Possible aspiration.  - Continues to have high O2 requirements -Oscillates between BiPAP and heated high flow - IV ceftriaxone  and azithromycin .  Day 5,, could DC antibiotics today, await pulmonary input Remains on IV Solu-Medrol , Brovana, Pulmicort , Yupelri Pulmonary following, low threshold for ICU transfer, tracheostomy  Atrial flutter with rapid ventricular response (HCC) Continue Toprol , amiodarone , apixaban   Hypokalemia AKI -repleted  Type 2 diabetes mellitus  Continue basal insulin  10 units.   Chronic iron  deficiency anemia Cell count has been stable   Hypothyroidism Continue levothyroxine    Obesity, class 3 Calculated BMI is 53.3   Anxiety Continue with as needed alprazolam .  Continue with bupropion    Acute on chronic back pain, continue with as needed  hydrocodone , DC hydromorphone     DVT prophylaxis: apixaban  Code Status: Full Code Family Communication: None present Disposition Plan: Pending stability  Consultants:    Procedures:   Antimicrobials:    Objective: Vitals:   02/06/24 0323 02/06/24 0328 02/06/24 0415 02/06/24 0810  BP: (!) 98/57  (!) 91/57 109/64  Pulse: (!) 56 (!) 55 (!) 56 (!) 58  Resp: 20 18 18 20   Temp: (!) 97.5 F (36.4 C)   97.8 F (36.6 C)  TempSrc: Oral   Oral  SpO2: 92% 92% 94% 98%  Weight:      Height:        Intake/Output Summary (Last 24 hours) at 02/06/2024 1114 Last data filed at 02/06/2024 2440 Gross per 24 hour  Intake 943 ml  Output 2900 ml  Net -1957 ml   Filed Weights   02/04/24 0426 02/05/24 0444 02/06/24 0100  Weight: 130.5 kg (!) 158.9 kg (!) 156.5 kg    Examination:  Gen: Morbidly obese chronically ill female on BiPAP HEENT: Neck obese build to assess JVD Lungs: Poor air movement bilaterally, no wheezing, few scattered Rales CVS: S1S2/RRR Abd: soft, Non tender, non distended, BS present Extremities: 1+ edema, chronic venous stasis changes Skin: As above of    Data Reviewed:   CBC: Recent Labs  Lab 02/03/24 0403 02/05/24 0315 02/06/24 0410  WBC 8.4 8.0 7.6  HGB 9.2* 8.5* 8.3*  HCT 32.0* 28.6* 28.3*  MCV 93.6 90.5 91.6  PLT 261 284 314   Basic Metabolic Panel: Recent Labs  Lab 02/01/24 0446 02/02/24 0831 02/03/24 0403 02/04/24 0407 02/05/24 0315 02/06/24 0410  NA 137 136 139 137 137  136  K 4.0 3.1* 3.0* 3.8 3.4* 3.5  CL 81* 77* 79* 79* 78* 81*  CO2 40* 44* >45* 42* >45* 41*  GLUCOSE 175* 222* 163* 170* 235* 203*  BUN 44* 53* 58* 60* 64* 70*  CREATININE 0.97 0.94 0.90 0.80 0.92 1.04*  CALCIUM  8.5* 8.6* 8.7* 8.1* 9.2 8.8*  MG 2.1 2.3 2.2 2.1 2.4  --    GFR: Estimated Creatinine Clearance: 86.6 mL/min (A) (by C-G formula based on SCr of 1.04 mg/dL (H)). Liver Function Tests: Recent Labs  Lab 02/06/24 0410  AST 12*  ALT 9  ALKPHOS 73  BILITOT  0.6  PROT 7.1  ALBUMIN  2.8*   No results for input(s): "LIPASE", "AMYLASE" in the last 168 hours. No results for input(s): "AMMONIA" in the last 168 hours.  Coagulation Profile: No results for input(s): "INR", "PROTIME" in the last 168 hours.  Cardiac Enzymes: No results for input(s): "CKTOTAL", "CKMB", "CKMBINDEX", "TROPONINI" in the last 168 hours.  BNP (last 3 results) No results for input(s): "PROBNP" in the last 8760 hours. HbA1C: No results for input(s): "HGBA1C" in the last 72 hours. CBG: Recent Labs  Lab 02/05/24 1543 02/05/24 2110 02/05/24 2351 02/06/24 0319 02/06/24 0807  GLUCAP 315* 348* 293* 215* 161*   Lipid Profile: No results for input(s): "CHOL", "HDL", "LDLCALC", "TRIG", "CHOLHDL", "LDLDIRECT" in the last 72 hours. Thyroid Function Tests: No results for input(s): "TSH", "T4TOTAL", "FREET4", "T3FREE", "THYROIDAB" in the last 72 hours. Anemia Panel: No results for input(s): "VITAMINB12", "FOLATE", "FERRITIN", "TIBC", "IRON ", "RETICCTPCT" in the last 72 hours. Urine analysis:    Component Value Date/Time   COLORURINE STRAW (A) 01/29/2024 1446   APPEARANCEUR CLEAR 01/29/2024 1446   LABSPEC 1.006 01/29/2024 1446   PHURINE 6.0 01/29/2024 1446   GLUCOSEU >=500 (A) 01/29/2024 1446   HGBUR NEGATIVE 01/29/2024 1446   BILIRUBINUR NEGATIVE 01/29/2024 1446   KETONESUR NEGATIVE 01/29/2024 1446   PROTEINUR NEGATIVE 01/29/2024 1446   UROBILINOGEN 0.2 12/08/2010 0545   NITRITE NEGATIVE 01/29/2024 1446   LEUKOCYTESUR NEGATIVE 01/29/2024 1446   Sepsis Labs: @LABRCNTIP (procalcitonin:4,lacticidven:4)  ) Recent Results (from the past 240 hours)  Resp panel by RT-PCR (RSV, Flu A&B, Covid) Anterior Nasal Swab     Status: None   Collection Time: 01/28/24  8:29 PM   Specimen: Anterior Nasal Swab  Result Value Ref Range Status   SARS Coronavirus 2 by RT PCR NEGATIVE NEGATIVE Final   Influenza A by PCR NEGATIVE NEGATIVE Final   Influenza B by PCR NEGATIVE NEGATIVE  Final    Comment: (NOTE) The Xpert Xpress SARS-CoV-2/FLU/RSV plus assay is intended as an aid in the diagnosis of influenza from Nasopharyngeal swab specimens and should not be used as a sole basis for treatment. Nasal washings and aspirates are unacceptable for Xpert Xpress SARS-CoV-2/FLU/RSV testing.  Fact Sheet for Patients: BloggerCourse.com  Fact Sheet for Healthcare Providers: SeriousBroker.it  This test is not yet approved or cleared by the [place]  FDA and has been authorized for detection and/or diagnosis of SARS-CoV-2 by FDA under an Emergency Use Authorization (EUA). This EUA will remain in effect (meaning this test can be used) for the duration of the COVID-19 declaration under Section 564(b)(1) of the Act, 21 U.S.C. section 360bbb-3(b)(1), unless the authorization is terminated or revoked.     Resp Syncytial Virus by PCR NEGATIVE NEGATIVE Final    Comment: (NOTE) Fact Sheet for Patients: BloggerCourse.com  Fact Sheet for Healthcare Providers: SeriousBroker.it  This test is not yet approved or cleared by the  [place]  FDA and has been authorized for detection and/or diagnosis of SARS-CoV-2 by FDA under an Emergency Use Authorization (EUA). This EUA will remain in effect (meaning this test can be used) for the duration of the COVID-19 declaration under Section 564(b)(1) of the Act, 21 U.S.C. section 360bbb-3(b)(1), unless the authorization is terminated or revoked.  Performed at [facility], [address] [place], [place] [address]   Respiratory (~20 pathogens) panel by PCR     Status: None   Collection Time: 02/02/24  3:41 PM   Specimen: Nasopharyngeal Swab; Respiratory  Result Value Ref Range Status   Adenovirus NOT DETECTED NOT DETECTED Final   Coronavirus 229E NOT DETECTED NOT DETECTED Final    Comment: (NOTE) The Coronavirus on the  Respiratory Panel, DOES NOT test for the novel  Coronavirus (2019 nCoV)    Coronavirus HKU1 NOT DETECTED NOT DETECTED Final   Coronavirus NL63 NOT DETECTED NOT DETECTED Final   Coronavirus OC43 NOT DETECTED NOT DETECTED Final   Metapneumovirus NOT DETECTED NOT DETECTED Final   Rhinovirus / Enterovirus NOT DETECTED NOT DETECTED Final   Influenza A NOT DETECTED NOT DETECTED Final   Influenza B NOT DETECTED NOT DETECTED Final   Parainfluenza Virus 1 NOT DETECTED NOT DETECTED Final   Parainfluenza Virus 2 NOT DETECTED NOT DETECTED Final   Parainfluenza Virus 3 NOT DETECTED NOT DETECTED Final   Parainfluenza Virus 4 NOT DETECTED NOT DETECTED Final   Respiratory Syncytial Virus NOT DETECTED NOT DETECTED Final   Bordetella pertussis NOT DETECTED NOT DETECTED Final   Bordetella Parapertussis NOT DETECTED NOT DETECTED Final   Chlamydophila pneumoniae NOT DETECTED NOT DETECTED Final   Mycoplasma pneumoniae NOT DETECTED NOT DETECTED Final    Comment: Performed at [facility], [address], [place], [place] [address]     Radiology Studies: No results found.   Scheduled Meds:  (feeding supplement) PROSource Plus  30 mL Oral BID BM   acetaZOLAMIDE   500 mg Intravenous Q12H   amiodarone   200 mg Oral Daily   apixaban   5 mg Oral BID   arformoterol  15 mcg Nebulization BID   atorvastatin   20 mg Oral QPM   budesonide  (PULMICORT ) nebulizer solution  0.5 mg Nebulization BID   buPROPion  ER  100 mg Oral BID   docusate sodium   100 mg Oral BID   feeding supplement (GLUCERNA SHAKE)  237 mL Oral TID BM   furosemide   60 mg Intravenous Q8H   guaiFENesin   600 mg Oral BID   insulin  aspart  0-9 Units Subcutaneous Q4H   insulin  glargine-yfgn  10 Units Subcutaneous BID   levothyroxine   150 mcg Oral q AM   methylPREDNISolone  (SOLU-MEDROL ) injection  60 mg Intravenous Q24H   metoprolol  succinate  25 mg Oral Daily   montelukast   10 mg Oral QHS   revefenacin  175 mcg Nebulization Daily   rOPINIRole    0.5 mg Oral QHS   senna  1 tablet Oral BID   sodium chloride  flush  3 mL Intravenous Q12H   spironolactone   12.5 mg Oral Daily   Continuous Infusions:  cefTRIAXone  (ROCEPHIN )  IV 2 g (02/05/24 1238)     LOS: 9 days    Time spent:    Deforest Fast, MD [facility]   02/06/2024, 11:14 AM

## 2024-02-07 DIAGNOSIS — I5033 Acute on chronic diastolic (congestive) heart failure: Secondary | ICD-10-CM | POA: Diagnosis not present

## 2024-02-07 LAB — GLUCOSE, CAPILLARY
Glucose-Capillary: 150 mg/dL — ABNORMAL HIGH (ref 70–99)
Glucose-Capillary: 155 mg/dL — ABNORMAL HIGH (ref 70–99)
Glucose-Capillary: 167 mg/dL — ABNORMAL HIGH (ref 70–99)
Glucose-Capillary: 233 mg/dL — ABNORMAL HIGH (ref 70–99)
Glucose-Capillary: 276 mg/dL — ABNORMAL HIGH (ref 70–99)
Glucose-Capillary: 322 mg/dL — ABNORMAL HIGH (ref 70–99)
Glucose-Capillary: 343 mg/dL — ABNORMAL HIGH (ref 70–99)

## 2024-02-07 LAB — BASIC METABOLIC PANEL WITH GFR
Anion gap: 13 (ref 5–15)
BUN: 63 mg/dL — ABNORMAL HIGH (ref 6–20)
CO2: 43 mmol/L — ABNORMAL HIGH (ref 22–32)
Calcium: 8.5 mg/dL — ABNORMAL LOW (ref 8.9–10.3)
Chloride: 82 mmol/L — ABNORMAL LOW (ref 98–111)
Creatinine, Ser: 1.09 mg/dL — ABNORMAL HIGH (ref 0.44–1.00)
GFR, Estimated: 58 mL/min — ABNORMAL LOW (ref >60)
Glucose, Bld: 169 mg/dL — ABNORMAL HIGH (ref 70–99)
Potassium: 3.1 mmol/L — ABNORMAL LOW (ref 3.5–5.1)
Sodium: 138 mmol/L (ref 135–145)

## 2024-02-07 LAB — MAGNESIUM: Magnesium: 2.3 mg/dL (ref 1.7–2.4)

## 2024-02-07 MED ORDER — POTASSIUM CHLORIDE 20 MEQ PO PACK
40.0000 meq | PACK | Freq: Three times a day (TID) | ORAL | Status: DC
  Administered 2024-02-07: 40 meq via ORAL
  Filled 2024-02-07 (×2): qty 2

## 2024-02-07 MED ORDER — POTASSIUM CHLORIDE CRYS ER 20 MEQ PO TBCR
40.0000 meq | EXTENDED_RELEASE_TABLET | Freq: Two times a day (BID) | ORAL | Status: AC
  Administered 2024-02-07 – 2024-02-08 (×2): 40 meq via ORAL
  Filled 2024-02-07 (×2): qty 2

## 2024-02-07 NOTE — Progress Notes (Signed)
 NAME:  Chelsea [name], MRN:  [id number], DOB:  Jul 29, 1966, LOS: 10 ADMISSION DATE:  01/28/2024, CONSULTATION DATE:  01/29/2024 REFERRING MD:  Lind Repine, MD, CHIEF COMPLAINT:  SOB   History of Present Illness:  58 y/o female with PMH for Morbid obesity, A fib, likely OHS with chronic respiratory failure on 5 lioters home O2, COPD, HTN, Hep C, DMT2 who presented with 4 days of worsening SOB.  No fever/chills.  She was placed on BiPAP in ED for PCO2 of 73 and her SOB.  The BiPAP seemed to help and she was also given Lasix  40mg , Rocephin  and Azithromycin .  Patient c/o LE and foot pain and normally takes Oxycodone  3x/day.    Pertinent  Medical History  orbid obesity, A fib, likely OHS with chronic respiratory failure on 5 lioters home O2, COPD, HTN, Hep C, DMT2  Significant Hospital Events: Including procedures, antibiotic start and stop dates in addition to other pertinent events   4/30: can be admitted to floor/stepdown 5/5: compliant with BiPAP 09/08/59%, RR 15, when sleeping and 15 L [place] when awake. Diuresed 3.6 L in the last 48 hrs 5/6: negative 0.5 L fluid balance in the last 24 hrs. On BiPAP 09/08/59%, RR 15, when sleeping and 15 L [place] when awake. Poor oral intake. ?I/S use. Started on IV steroids by primary team  5/7: negative 2.4 L fluid balance in the last 24 hrs. On HFNC 40L/58%, BiPAP PRN 09/07/49%, RR 15. Feels better and uses the I/S 5/8: negative 2.1 L fluid balance in the last 24 hrs. On HFNC 40L/60%, BiPAP PRN 09/07/49%, RR 15. Feels better and uses the I/S. She has itchy eyes. 5/9: negative 1.6 L fluid balance in the last 24 hrs. On HFNC 25L/60%, BiPAP PRN 09/07/54%, RR 15. Feels better and uses the I/S.  Interim History / Subjective:  Stable No CP, cough or wheezing No f/c/r Objective   Blood pressure 102/60, pulse 73, temperature 98.5 F (36.9 C), temperature source Oral, resp. rate 20, height 5\' 4"  (1.626 m), weight (!) 157.2 kg, SpO2 92%.    FiO2 (%):  [55 %-60  %] 60 % PEEP:  [8 cmH20] 8 cmH20 Pressure Support:  [4 cmH20] 4 cmH20   Intake/Output Summary (Last 24 hours) at 02/07/2024 1452 Last data filed at 02/07/2024 1059 Gross per 24 hour  Intake 660 ml  Output 2800 ml  Net -2140 ml   Filed Weights   02/05/24 0444 02/06/24 0100 02/07/24 0432  Weight: (!) 158.9 kg (!) 156.5 kg (!) 157.2 kg    Examination: Morbidly obese General: alert, oriented x4, and comfortable. SpO2 95%  HENT: PERL, normal pharynx and oral mucosa. No LNE or thyromegaly. No JVD Lungs: symmetrical air entry bilaterally. Scattered crackles. No wheezing Cardiovascular: NL S1/S2. No m/g/r Abdomen: no distension or tenderness Extremities: no edema. Symmetrical. Chronic venous insufficiency  Neuro: nonfocal  Resolved Hospital Problem list   N/a  Assessment & Plan:  Acute on chronic hypercapnic and hypoxic respiratory failure, baseline 5 L [place], due to diastolic CHF exacerbation.  Acute on chronic HFpEF, moderate AS. EF 60% Morbid Obesity OSA/OHS COPD Atrial fibrillation HTN: controlled  Hep C DM-2 Anemia of chronic illness HypoK Itchy eyes  - Continue Lasix , aldactone , Diamox  and fluid restriction - Monitor daily weight (?gained 35 Ib since admission based on the EMR!) and labs  - Replace K, Mg is fine.  - Budesonide , yupelri  and brovana  nebs - Intermittently bipap dependent and wean O2 as tolerated - I/S -  Tracheostomy as recommended in prior consultations, but she declined  - Agree with starting antibiotics ceftriaxone  #5, Zithromax  #3 (finished) - Check sputum culture: negative  - PCT: low - Resp viral panel: negative  - Speech evaluation: noted - Dietitian consult - Lantus , on steroids per primary team - Change IV Solumedrol to PO for 3 days  - PT/OT - Olopatadine  eye drops - Palliative care consult   PCCM will continue to follow  Madelynn Schilder, MD [facility] Office: [phone]   See Amion for personal pager PCCM on  call pager [phone] until 7pm. Please call Elink 7p-7a. [phone]

## 2024-02-07 NOTE — Progress Notes (Signed)
 PROGRESS NOTE    Chelsea [name]  ZOX:[id number] DOB: 08/06/1966 DOA: 01/28/2024 PCP: Pcp, No  58 y/o female with PMH for Morbid obesity, A fib, likely OHS with chronic respiratory failure on 5 lioters home O2, COPD, HTN, Hep C, DMT2 who presented with 4 days of worsening SOB.  She was placed on BiPAP in ED for PCO2 of 73 and her SOB. The BiPAP seemed to help and she was also given Lasix  40mg , Rocephin  and Azithromycin . Patient c/o LE and foot pain and normally takes Oxycodone  3x/day.   Subjective: - Feels fair, no events overnight, was on BiPAP last night, down to 25 L high flow  Assessment and Plan:  Acute on chronic diastolic CHF -Echo with EF 60 to 65%, RV systolic function preserved, moderate valve aortic stenosis,  -5/04 CT chest with bilateral ground glass opacities, more dense infiltrates at the bases, Small bilateral pleural effusions.  - She is 17 negative, continue furosemide  60 mg IV tid, spironolactone   - Cut down Toprol  dose for bradycardia  Acute on chronic hypoxemic and hypercapnic respiratory failure. Obesity hypoventilation syndrome  Bilateral lower lobe pneumonia, more right than left. Possible aspiration.  - Continues to have high O2 requirements -Oscillates between BiPAP and heated high flow - Completed 5 days of antibiotics, changed from IV steroids to prednisone  -Continue Brovana , Pulmicort , Yupelri  Pulmonary following, low threshold for ICU transfer, tracheostomy - May need palliative care eval  Atrial flutter with rapid ventricular response (HCC) Continue Toprol , amiodarone , apixaban   Hypokalemia AKI -repleted  Type 2 diabetes mellitus  Continue basal insulin  10 units.   Chronic iron  deficiency anemia Cell count has been stable   Hypothyroidism Continue levothyroxine    Obesity, class 3 Calculated BMI is 53.3   Anxiety Continue with as needed alprazolam .  Continue with bupropion    Acute on chronic back pain, continue with as needed  hydrocodone , DC hydromorphone     DVT prophylaxis: apixaban  Code Status: Full Code Family Communication: None present Disposition Plan: Pending stability  Consultants:    Procedures:   Antimicrobials:    Objective: Vitals:   02/07/24 0432 02/07/24 0800 02/07/24 0921 02/07/24 1056  BP: 106/64 103/67 (!) 102/59 102/60  Pulse: (!) 58 (!) 57 66 73  Resp: 18 18  20   Temp: 98 F (36.7 C)   98.5 F (36.9 C)  TempSrc: Oral   Oral  SpO2: 93% 97%  92%  Weight: (!) 157.2 kg     Height:        Intake/Output Summary (Last 24 hours) at 02/07/2024 1230 Last data filed at 02/07/2024 1059 Gross per 24 hour  Intake 660 ml  Output 2800 ml  Net -2140 ml   Filed Weights   02/05/24 0444 02/06/24 0100 02/07/24 0432  Weight: (!) 158.9 kg (!) 156.5 kg (!) 157.2 kg    Examination:  Gen: Morbidly obese chronically ill female on BiPAP HEENT: Neck obese build to assess JVD Lungs: Poor air movement bilaterally, no wheezing, few scattered Rales CVS: S1S2/RRR Abd: soft, Non tender, non distended, BS present Extremities: 1+ edema, chronic venous stasis changes Skin: As above of    Data Reviewed:   CBC: Recent Labs  Lab 02/03/24 0403 02/05/24 0315 02/06/24 0410  WBC 8.4 8.0 7.6  HGB 9.2* 8.5* 8.3*  HCT 32.0* 28.6* 28.3*  MCV 93.6 90.5 91.6  PLT 261 284 314   Basic Metabolic Panel: Recent Labs  Lab 02/02/24 0831 02/03/24 0403 02/04/24 0407 02/05/24 0315 02/06/24 0410 02/07/24 0326 02/07/24 0941  NA  136 139 137 137 136 138  --   K 3.1* 3.0* 3.8 3.4* 3.5 3.1*  --   CL 77* 79* 79* 78* 81* 82*  --   CO2 44* >45* 42* >45* 41* 43*  --   GLUCOSE 222* 163* 170* 235* 203* 169*  --   BUN 53* 58* 60* 64* 70* 63*  --   CREATININE 0.94 0.90 0.80 0.92 1.04* 1.09*  --   CALCIUM  8.6* 8.7* 8.1* 9.2 8.8* 8.5*  --   MG 2.3 2.2 2.1 2.4  --   --  2.3   GFR: Estimated Creatinine Clearance: 82.9 mL/min (A) (by C-G formula based on SCr of 1.09 mg/dL (H)). Liver Function Tests: Recent  Labs  Lab 02/06/24 0410  AST 12*  ALT 9  ALKPHOS 73  BILITOT 0.6  PROT 7.1  ALBUMIN  2.8*   No results for input(s): "LIPASE", "AMYLASE" in the last 168 hours. No results for input(s): "AMMONIA" in the last 168 hours.  Coagulation Profile: No results for input(s): "INR", "PROTIME" in the last 168 hours.  Cardiac Enzymes: No results for input(s): "CKTOTAL", "CKMB", "CKMBINDEX", "TROPONINI" in the last 168 hours.  BNP (last 3 results) No results for input(s): "PROBNP" in the last 8760 hours. HbA1C: No results for input(s): "HGBA1C" in the last 72 hours. CBG: Recent Labs  Lab 02/06/24 2045 02/07/24 0020 02/07/24 0430 02/07/24 0812 02/07/24 1152  GLUCAP 220* 167* 150* 155* 233*   Lipid Profile: No results for input(s): "CHOL", "HDL", "LDLCALC", "TRIG", "CHOLHDL", "LDLDIRECT" in the last 72 hours. Thyroid Function Tests: No results for input(s): "TSH", "T4TOTAL", "FREET4", "T3FREE", "THYROIDAB" in the last 72 hours. Anemia Panel: No results for input(s): "VITAMINB12", "FOLATE", "FERRITIN", "TIBC", "IRON ", "RETICCTPCT" in the last 72 hours. Urine analysis:    Component Value Date/Time   COLORURINE STRAW (A) 01/29/2024 1446   APPEARANCEUR CLEAR 01/29/2024 1446   LABSPEC 1.006 01/29/2024 1446   PHURINE 6.0 01/29/2024 1446   GLUCOSEU >=500 (A) 01/29/2024 1446   HGBUR NEGATIVE 01/29/2024 1446   BILIRUBINUR NEGATIVE 01/29/2024 1446   KETONESUR NEGATIVE 01/29/2024 1446   PROTEINUR NEGATIVE 01/29/2024 1446   UROBILINOGEN 0.2 12/08/2010 0545   NITRITE NEGATIVE 01/29/2024 1446   LEUKOCYTESUR NEGATIVE 01/29/2024 1446   Sepsis Labs: @LABRCNTIP (procalcitonin:4,lacticidven:4)  ) Recent Results (from the past 240 hours)  Resp panel by RT-PCR (RSV, Flu A&B, Covid) Anterior Nasal Swab     Status: None   Collection Time: 01/28/24  8:29 PM   Specimen: Anterior Nasal Swab  Result Value Ref Range Status   SARS Coronavirus 2 by RT PCR NEGATIVE NEGATIVE Final   Influenza A by PCR  NEGATIVE NEGATIVE Final   Influenza B by PCR NEGATIVE NEGATIVE Final    Comment: (NOTE) The Xpert Xpress SARS-CoV-2/FLU/RSV plus assay is intended as an aid in the diagnosis of influenza from Nasopharyngeal swab specimens and should not be used as a sole basis for treatment. Nasal washings and aspirates are unacceptable for Xpert Xpress SARS-CoV-2/FLU/RSV testing.  Fact Sheet for Patients: BloggerCourse.com  Fact Sheet for Healthcare Providers: SeriousBroker.it  This test is not yet approved or cleared by the [place]  FDA and has been authorized for detection and/or diagnosis of SARS-CoV-2 by FDA under an Emergency Use Authorization (EUA). This EUA will remain in effect (meaning this test can be used) for the duration of the COVID-19 declaration under Section 564(b)(1) of the Act, 21 U.S.C. section 360bbb-3(b)(1), unless the authorization is terminated or revoked.     Resp Syncytial Virus by  PCR NEGATIVE NEGATIVE Final    Comment: (NOTE) Fact Sheet for Patients: BloggerCourse.com  Fact Sheet for Healthcare Providers: SeriousBroker.it  This test is not yet approved or cleared by the [place]  FDA and has been authorized for detection and/or diagnosis of SARS-CoV-2 by FDA under an Emergency Use Authorization (EUA). This EUA will remain in effect (meaning this test can be used) for the duration of the COVID-19 declaration under Section 564(b)(1) of the Act, 21 U.S.C. section 360bbb-3(b)(1), unless the authorization is terminated or revoked.  Performed at [facility], [address], [place], [place] [address]   Respiratory (~20 pathogens) panel by PCR     Status: None   Collection Time: 02/02/24  3:41 PM   Specimen: Nasopharyngeal Swab; Respiratory  Result Value Ref Range Status   Adenovirus NOT DETECTED NOT DETECTED Final   Coronavirus 229E NOT DETECTED NOT  DETECTED Final    Comment: (NOTE) The Coronavirus on the Respiratory Panel, DOES NOT test for the novel  Coronavirus (2019 nCoV)    Coronavirus HKU1 NOT DETECTED NOT DETECTED Final   Coronavirus NL63 NOT DETECTED NOT DETECTED Final   Coronavirus OC43 NOT DETECTED NOT DETECTED Final   Metapneumovirus NOT DETECTED NOT DETECTED Final   Rhinovirus / Enterovirus NOT DETECTED NOT DETECTED Final   Influenza A NOT DETECTED NOT DETECTED Final   Influenza B NOT DETECTED NOT DETECTED Final   Parainfluenza Virus 1 NOT DETECTED NOT DETECTED Final   Parainfluenza Virus 2 NOT DETECTED NOT DETECTED Final   Parainfluenza Virus 3 NOT DETECTED NOT DETECTED Final   Parainfluenza Virus 4 NOT DETECTED NOT DETECTED Final   Respiratory Syncytial Virus NOT DETECTED NOT DETECTED Final   Bordetella pertussis NOT DETECTED NOT DETECTED Final   Bordetella Parapertussis NOT DETECTED NOT DETECTED Final   Chlamydophila pneumoniae NOT DETECTED NOT DETECTED Final   Mycoplasma pneumoniae NOT DETECTED NOT DETECTED Final    Comment: Performed at [facility], [address] [place], [place] [address]     Radiology Studies: No results found.   Scheduled Meds:  (feeding supplement) PROSource Plus  30 mL Oral BID BM   amiodarone   200 mg Oral Daily   apixaban   5 mg Oral BID   arformoterol   15 mcg Nebulization BID   atorvastatin   20 mg Oral QPM   budesonide  (PULMICORT ) nebulizer solution  0.5 mg Nebulization BID   buPROPion  ER  100 mg Oral BID   feeding supplement (GLUCERNA SHAKE)  237 mL Oral TID BM   furosemide   60 mg Intravenous Q8H   guaiFENesin   600 mg Oral BID   insulin  aspart  0-9 Units Subcutaneous Q4H   insulin  aspart  3 Units Subcutaneous TID WC   insulin  glargine-yfgn  13 Units Subcutaneous BID   levothyroxine   150 mcg Oral q AM   metoprolol  succinate  12.5 mg Oral Daily   midodrine   5 mg Oral TID WC   montelukast   10 mg Oral QHS   olopatadine   1 drop Both Eyes BID   potassium chloride   40 mEq  Oral TID   predniSONE   40 mg Oral Q breakfast   revefenacin   175 mcg Nebulization Daily   rOPINIRole   0.5 mg Oral QHS   senna  1 tablet Oral BID   sodium chloride  flush  3 mL Intravenous Q12H   spironolactone   12.5 mg Oral Daily   Continuous Infusions:     LOS: 10 days    Time spent:  Deforest Fast, MD [facility]   02/07/2024, 12:30 PM

## 2024-02-08 DIAGNOSIS — Z7189 Other specified counseling: Secondary | ICD-10-CM

## 2024-02-08 DIAGNOSIS — I5033 Acute on chronic diastolic (congestive) heart failure: Secondary | ICD-10-CM | POA: Diagnosis not present

## 2024-02-08 DIAGNOSIS — Z515 Encounter for palliative care: Secondary | ICD-10-CM

## 2024-02-08 LAB — GLUCOSE, CAPILLARY
Glucose-Capillary: 123 mg/dL — ABNORMAL HIGH (ref 70–99)
Glucose-Capillary: 138 mg/dL — ABNORMAL HIGH (ref 70–99)
Glucose-Capillary: 199 mg/dL — ABNORMAL HIGH (ref 70–99)
Glucose-Capillary: 265 mg/dL — ABNORMAL HIGH (ref 70–99)
Glucose-Capillary: 282 mg/dL — ABNORMAL HIGH (ref 70–99)
Glucose-Capillary: 323 mg/dL — ABNORMAL HIGH (ref 70–99)

## 2024-02-08 LAB — BASIC METABOLIC PANEL WITH GFR
Anion gap: 10 (ref 5–15)
BUN: 56 mg/dL — ABNORMAL HIGH (ref 6–20)
CO2: 41 mmol/L — ABNORMAL HIGH (ref 22–32)
Calcium: 8.6 mg/dL — ABNORMAL LOW (ref 8.9–10.3)
Chloride: 87 mmol/L — ABNORMAL LOW (ref 98–111)
Creatinine, Ser: 0.87 mg/dL (ref 0.44–1.00)
GFR, Estimated: >60 mL/min (ref >60)
Glucose, Bld: 140 mg/dL — ABNORMAL HIGH (ref 70–99)
Potassium: 3.4 mmol/L — ABNORMAL LOW (ref 3.5–5.1)
Sodium: 138 mmol/L (ref 135–145)

## 2024-02-08 NOTE — Progress Notes (Signed)
 PROGRESS NOTE    Chelsea [name]  MWU:[id number] DOB: Jan 23, 1966 DOA: 01/28/2024 PCP: Pcp, No  58 y/o female with PMH for Morbid obesity, A fib, likely OHS with chronic respiratory failure on 5 lioters home O2, COPD, HTN, Hep C, DMT2 who presented with 4 days of worsening SOB.  She was placed on BiPAP in ED for PCO2 of 73 and her SOB. The BiPAP seemed to help and she was also given Lasix  40mg , Rocephin  and Azithromycin . Patient c/o LE and foot pain and normally takes Oxycodone  3x/day.   Subjective: - Feels fair, no events overnight, was on BiPAP last night, down to 25 L high flow  Assessment and Plan:  Acute on chronic diastolic CHF -Echo with EF 60 to 65%, RV systolic function preserved, moderate valve aortic stenosis,  -5/04 CT chest with bilateral ground glass opacities, more dense infiltrates at the bases, Small bilateral pleural effusions.  - She is 20 L negative, continue furosemide  60 mg IV tid, spironolactone   - Toprol  dose lowered yesterday  Acute on chronic hypoxemic and hypercapnic respiratory failure. Obesity hypoventilation syndrome  Bilateral lower lobe pneumonia, more right than left. Possible aspiration.  - Continues to have high O2 requirements - Oscillates between BiPAP and heated high flow - Completed 5 days of antibiotics, changed from IV steroids to prednisone  -Continue Brovana , Pulmicort , Yupelri  Pulmonary following, low threshold for ICU transfer, tracheostomy - Palliative consulted yesterday - Very poor prognosis, poor quality of life  Atrial flutter with rapid ventricular response (HCC) Continue Toprol , amiodarone , apixaban   Hypokalemia AKI -repleted  Type 2 diabetes mellitus  Continue basal insulin  10 units.   Chronic iron  deficiency anemia Cell count has been stable   Hypothyroidism Continue levothyroxine    Obesity, class 3 Calculated BMI is 53.3   Anxiety Continue with as needed alprazolam .  Continue with bupropion    Acute on  chronic back pain, continue with as needed hydrocodone , DC hydromorphone     DVT prophylaxis: apixaban  Code Status: Full Code Family Communication: None present Disposition Plan: Unknown  Consultants: PCCM   Objective: Vitals:   02/08/24 0105 02/08/24 0409 02/08/24 0500 02/08/24 0802  BP:  (!) 106/58  103/70  Pulse: (!) 57 (!) 56    Resp: (!) 22 17  13   Temp:  98 F (36.7 C)  97.8 F (36.6 C)  TempSrc:  Oral  Oral  SpO2: 94% 96%    Weight:   (!) 155.1 kg   Height:        Intake/Output Summary (Last 24 hours) at 02/08/2024 1143 Last data filed at 02/08/2024 7253 Gross per 24 hour  Intake 610 ml  Output 2725 ml  Net -2115 ml   Filed Weights   02/06/24 0100 02/07/24 0432 02/08/24 0500  Weight: (!) 156.5 kg (!) 157.2 kg (!) 155.1 kg    Examination:  Gen: Morbidly obese chronically ill female laying in bed, AAO x 3, on BiPAP this morning HEENT: Neck obese build to assess JVD Lungs: Poor air movement, rare scattered rhonchi CVS: S1S2/RRR Abd: soft, Non tender, non distended, BS present Extremities: 1+ edema, chronic venous stasis changes Skin: As above    Data Reviewed:   CBC: Recent Labs  Lab 02/03/24 0403 02/05/24 0315 02/06/24 0410  WBC 8.4 8.0 7.6  HGB 9.2* 8.5* 8.3*  HCT 32.0* 28.6* 28.3*  MCV 93.6 90.5 91.6  PLT 261 284 314   Basic Metabolic Panel: Recent Labs  Lab 02/02/24 0831 02/03/24 0403 02/04/24 0407 02/05/24 0315 02/06/24 0410 02/07/24 0326 02/07/24  0941 02/08/24 0351  NA 136 139 137 137 136 138  --  138  K 3.1* 3.0* 3.8 3.4* 3.5 3.1*  --  3.4*  CL 77* 79* 79* 78* 81* 82*  --  87*  CO2 44* >45* 42* >45* 41* 43*  --  41*  GLUCOSE 222* 163* 170* 235* 203* 169*  --  140*  BUN 53* 58* 60* 64* 70* 63*  --  56*  CREATININE 0.94 0.90 0.80 0.92 1.04* 1.09*  --  0.87  CALCIUM  8.6* 8.7* 8.1* 9.2 8.8* 8.5*  --  8.6*  MG 2.3 2.2 2.1 2.4  --   --  2.3  --    GFR: Estimated Creatinine Clearance: 103 mL/min (by C-G formula based on SCr of  0.87 mg/dL). Liver Function Tests: Recent Labs  Lab 02/06/24 0410  AST 12*  ALT 9  ALKPHOS 73  BILITOT 0.6  PROT 7.1  ALBUMIN  2.8*   No results for input(s): "LIPASE", "AMYLASE" in the last 168 hours. No results for input(s): "AMMONIA" in the last 168 hours.  Coagulation Profile: No results for input(s): "INR", "PROTIME" in the last 168 hours.  Cardiac Enzymes: No results for input(s): "CKTOTAL", "CKMB", "CKMBINDEX", "TROPONINI" in the last 168 hours.  BNP (last 3 results) No results for input(s): "PROBNP" in the last 8760 hours. HbA1C: No results for input(s): "HGBA1C" in the last 72 hours. CBG: Recent Labs  Lab 02/07/24 1558 02/07/24 1938 02/07/24 2321 02/08/24 0402 02/08/24 0759  GLUCAP 322* 343* 276* 138* 123*   Lipid Profile: No results for input(s): "CHOL", "HDL", "LDLCALC", "TRIG", "CHOLHDL", "LDLDIRECT" in the last 72 hours. Thyroid Function Tests: No results for input(s): "TSH", "T4TOTAL", "FREET4", "T3FREE", "THYROIDAB" in the last 72 hours. Anemia Panel: No results for input(s): "VITAMINB12", "FOLATE", "FERRITIN", "TIBC", "IRON ", "RETICCTPCT" in the last 72 hours. Urine analysis:    Component Value Date/Time   COLORURINE STRAW (A) 01/29/2024 1446   APPEARANCEUR CLEAR 01/29/2024 1446   LABSPEC 1.006 01/29/2024 1446   PHURINE 6.0 01/29/2024 1446   GLUCOSEU >=500 (A) 01/29/2024 1446   HGBUR NEGATIVE 01/29/2024 1446   BILIRUBINUR NEGATIVE 01/29/2024 1446   KETONESUR NEGATIVE 01/29/2024 1446   PROTEINUR NEGATIVE 01/29/2024 1446   UROBILINOGEN 0.2 12/08/2010 0545   NITRITE NEGATIVE 01/29/2024 1446   LEUKOCYTESUR NEGATIVE 01/29/2024 1446   Sepsis Labs: @LABRCNTIP (procalcitonin:4,lacticidven:4)  ) Recent Results (from the past 240 hours)  Respiratory (~20 pathogens) panel by PCR     Status: None   Collection Time: 02/02/24  3:41 PM   Specimen: Nasopharyngeal Swab; Respiratory  Result Value Ref Range Status   Adenovirus NOT DETECTED NOT DETECTED  Final   Coronavirus 229E NOT DETECTED NOT DETECTED Final    Comment: (NOTE) The Coronavirus on the Respiratory Panel, DOES NOT test for the novel  Coronavirus (2019 nCoV)    Coronavirus HKU1 NOT DETECTED NOT DETECTED Final   Coronavirus NL63 NOT DETECTED NOT DETECTED Final   Coronavirus OC43 NOT DETECTED NOT DETECTED Final   Metapneumovirus NOT DETECTED NOT DETECTED Final   Rhinovirus / Enterovirus NOT DETECTED NOT DETECTED Final   Influenza A NOT DETECTED NOT DETECTED Final   Influenza B NOT DETECTED NOT DETECTED Final   Parainfluenza Virus 1 NOT DETECTED NOT DETECTED Final   Parainfluenza Virus 2 NOT DETECTED NOT DETECTED Final   Parainfluenza Virus 3 NOT DETECTED NOT DETECTED Final   Parainfluenza Virus 4 NOT DETECTED NOT DETECTED Final   Respiratory Syncytial Virus NOT DETECTED NOT DETECTED Final   Bordetella pertussis  NOT DETECTED NOT DETECTED Final   Bordetella Parapertussis NOT DETECTED NOT DETECTED Final   Chlamydophila pneumoniae NOT DETECTED NOT DETECTED Final   Mycoplasma pneumoniae NOT DETECTED NOT DETECTED Final    Comment: Performed at [facility], [address] [place], [place] [address]     Radiology Studies: No results found.   Scheduled Meds:  (feeding supplement) PROSource Plus  30 mL Oral BID BM   amiodarone   200 mg Oral Daily   apixaban   5 mg Oral BID   arformoterol   15 mcg Nebulization BID   atorvastatin   20 mg Oral QPM   budesonide  (PULMICORT ) nebulizer solution  0.5 mg Nebulization BID   buPROPion  ER  100 mg Oral BID   feeding supplement (GLUCERNA SHAKE)  237 mL Oral TID BM   furosemide   60 mg Intravenous Q8H   guaiFENesin   600 mg Oral BID   insulin  aspart  0-9 Units Subcutaneous Q4H   insulin  aspart  3 Units Subcutaneous TID WC   insulin  glargine-yfgn  13 Units Subcutaneous BID   levothyroxine   150 mcg Oral q AM   metoprolol  succinate  12.5 mg Oral Daily   midodrine   5 mg Oral TID WC   montelukast   10 mg Oral QHS   olopatadine   1 drop  Both Eyes BID   predniSONE   40 mg Oral Q breakfast   revefenacin   175 mcg Nebulization Daily   rOPINIRole   0.5 mg Oral QHS   senna  1 tablet Oral BID   sodium chloride  flush  3 mL Intravenous Q12H   spironolactone   12.5 mg Oral Daily   Continuous Infusions:     LOS: 11 days    Time spent:    Deforest Fast, MD [facility]   02/08/2024, 11:43 AM

## 2024-02-08 NOTE — Plan of Care (Signed)
   Problem: Education: Goal: Ability to describe self-care measures that may prevent or decrease complications (Diabetes Survival Skills Education) will improve Outcome: Progressing Goal: Individualized Educational Video(s) Outcome: Progressing

## 2024-02-08 NOTE — Progress Notes (Signed)
 NAME:  Chelsea [name], MRN:  [id number], DOB:  03-Mar-1966, LOS: 11 ADMISSION DATE:  01/28/2024, CONSULTATION DATE:  01/29/2024 REFERRING MD:  Lind Repine, MD, CHIEF COMPLAINT:  SOB   History of Present Illness:  58 y/o female with PMH for Morbid obesity, A fib, likely OHS with chronic respiratory failure on 5 lioters home O2, COPD, HTN, Hep C, DMT2 who presented with 4 days of worsening SOB.  No fever/chills.  She was placed on BiPAP in ED for PCO2 of 73 and her SOB.  The BiPAP seemed to help and she was also given Lasix  40mg , Rocephin  and Azithromycin .  Patient c/o LE and foot pain and normally takes Oxycodone  3x/day.    Pertinent  Medical History  orbid obesity, A fib, likely OHS with chronic respiratory failure on 5 lioters home O2, COPD, HTN, Hep C, DMT2  Significant Hospital Events: Including procedures, antibiotic start and stop dates in addition to other pertinent events   4/30: can be admitted to floor/stepdown 5/5: compliant with BiPAP 09/08/59%, RR 15, when sleeping and 15 L [place] when awake. Diuresed 3.6 L in the last 48 hrs 5/6: negative 0.5 L fluid balance in the last 24 hrs. On BiPAP 09/08/59%, RR 15, when sleeping and 15 L [place] when awake. Poor oral intake. ?I/S use. Started on IV steroids by primary team  5/7: negative 2.4 L fluid balance in the last 24 hrs. On HFNC 40L/58%, BiPAP PRN 09/07/49%, RR 15. Feels better and uses the I/S 5/8: negative 2.1 L fluid balance in the last 24 hrs. On HFNC 40L/60%, BiPAP PRN 09/07/49%, RR 15. Feels better and uses the I/S. She has itchy eyes. 5/9: negative 1.6 L fluid balance in the last 24 hrs. On HFNC 25L/60%, BiPAP PRN 09/07/54%, RR 15. Feels better and uses the I/S.  5/10: negative 3.4 L fluid balance in the last 24 hrs. On salter [place] 10 L, BiPAP PRN 09/07/54%, RR 15. Uses the I/S. Interim History / Subjective:  Stable No CP, cough or wheezing No f/c/r Objective   Blood pressure 103/70, pulse (!) 56, temperature 97.8 F (36.6 C),  temperature source Oral, resp. rate 13, height 5\' 4"  (1.626 m), weight (!) 155.1 kg, SpO2 96%.    FiO2 (%):  [55 %-60 %] 55 %   Intake/Output Summary (Last 24 hours) at 02/08/2024 1124 Last data filed at 02/08/2024 0804 Gross per 24 hour  Intake 610 ml  Output 2725 ml  Net -2115 ml   Filed Weights   02/06/24 0100 02/07/24 0432 02/08/24 0500  Weight: (!) 156.5 kg (!) 157.2 kg (!) 155.1 kg    Examination: Morbidly obese General: alert, oriented x4, and comfortable. SpO2 95%  HENT: PERL, normal pharynx and oral mucosa. No LNE or thyromegaly. No JVD Lungs: symmetrical air entry bilaterally. Scattered crackles. No wheezing Cardiovascular: NL S1/S2. No m/g/r Abdomen: no distension or tenderness Extremities: no edema. Symmetrical. Chronic venous insufficiency  Neuro: nonfocal  Resolved Hospital Problem list   N/a  Assessment & Plan:  Acute on chronic hypercapnic and hypoxic respiratory failure, baseline 5 L [place], due to diastolic CHF exacerbation.  Acute on chronic HFpEF, moderate AS. EF 60% Morbid Obesity OSA/OHS COPD Atrial fibrillation HTN: controlled  Hep C DM-2 Anemia of chronic illness HypoK Itchy eyes  - Continue Lasix , aldactone , Diamox  and fluid restriction - Monitor daily weight (?gained 35 Ib since admission based on the EMR!: d/w RN and MD: the scale is not accurate) and labs  - Replace K, Mg  is fine.  - Budesonide , yupelri  and brovana  nebs - Intermittently bipap dependent and wean O2 as tolerated - I/S - Tracheostomy as recommended in prior consultations, but she declined  - s/p antibiotics ceftriaxone  #5, Zithromax  #3 (finished) - Sputum culture: negative, PCT: low, Resp viral panel: negative  - Speech evaluation: noted - Dietitian consult - Lantus , on steroids per primary team - IV Solumedrol to PO for 3 days, last dose 02/09/2024 - PT/OT - Olopatadine  eye drops - Palliative care consult   PCCM will continue to follow  Madelynn Schilder, MD [facility] Office: [phone]   See Amion for personal pager PCCM on call pager [phone] until 7pm. Please call Elink 7p-7a. [phone]

## 2024-02-08 NOTE — Consult Note (Signed)
 Palliative Care Consult Note                                  Date: 02/08/2024   Patient Name: Chelsea [name]  DOB: 01-Dec-1965  MRN: [id number]  Age / Sex: 58 y.o., female  PCP: Pcp, No Referring Physician: Deforest Fast, MD  Reason for Consultation: Establishing goals of care  HPI/Patient Profile: 58 y.o. female  with past medical history of DM2, morbid obesity, obesity hypoventilation syndrome, heart failure, chronic respiratory failure on 5L baseline and BiPAP at night, HTN, hep C, COPD, afib, and CHF  admitted on 01/28/2024 with four days of worsening shortness of breath.   Past Medical History:  Diagnosis Date   Acquired hypothyroidism 11/13/2010   Qualifier: Diagnosis of   By: Audery Blazing, MD, Penney Bowling      Acute blood loss anemia 07/18/2022   Acute congestive heart failure (HCC) 03/18/2021   Acute hypoxemic respiratory failure (HCC) 11/03/2023   Acute hypoxic on chronic hypercapnic respiratory failure (HCC) 09/19/2023   Acute idiopathic gout of left hand 02/16/2019   Acute kidney injury (HCC) 05/29/2023   Acute metabolic encephalopathy 09/29/2023   Acute on chronic diastolic CHF (congestive heart failure), NYHA class 3 (HCC) 09/25/2017   Acute on chronic respiratory failure with hypoxia and hypercapnia (HCC) 04/07/2022   Arthritis    "qwhere" (12/05/2017)   Asthma    Atrial flutter (HCC) 03/09/2021   Bleeding of the respiratory tract 04/07/2022   Bronchitis 05/30/2011   Cervical cancer (HCC) 2006   CHF exacerbation (HCC) 07/30/2022   Chronic heart failure with preserved ejection fraction (HCC) 02/23/2018   Chronic lower back pain    Chronic respiratory failure with hypoxia (HCC) 07/02/2018   ABG 11/2017 7.36/69   Consistent with obesity hypoventilation syndrome   Complication of anesthesia    "I have a hard time waking up from under it" (12/05/2017)   COPD (chronic obstructive pulmonary disease) (HCC)  03/04/2015   Coronary artery disease    Cough productive of clear sputum 06/03/2023   Diarrhea 06/18/2019   DJD (degenerative joint disease) 07/03/2021   DM2 (diabetes mellitus, type 2) (HCC) 11/10/2010   Dyspnea 12/05/2017   Dyspnea on exertion 02/09/2021   Edema 03/22/2011   Essential (primary) hypertension 11/10/2010   Formatting of this note might be different from the original.  Overview:   Qualifier: Diagnosis of   By: Nadean August      Last Assessment & Plan:   Watch blood pressure off of minoxidil and add medications as needed.   Essential hypertension 11/10/2010   Qualifier: Diagnosis of   By: Nadean August       Gastro-esophageal reflux disease without esophagitis 11/10/2010   GERD 11/10/2010   Qualifier: Diagnosis of   By: Nadean August       Hair loss 09/17/2019   HCAP (healthcare-associated pneumonia) 11/07/2023   Heart failure (HCC) 04/20/2021   Heart murmur    Hepatitis C    History of atrial fibrillation 09/04/2021   History of cervical cancer 03/22/2011   History of gout X 1   History of tobacco use 04/20/2021   History of urinary retention 09/20/2023   Hx of atrial flutter 03/15/2021   Hyperglycemia due to type 2 diabetes mellitus (HCC) 04/20/2021   Hyperlipidemia    Hyperphosphatemia 06/04/2023   Hypertension associated with diabetes (HCC) 08/25/2018   Intertrigo 06/22/2023   Irritable bowel syndrome with  diarrhea 06/18/2019   Limitation of activity due to disability 09/18/2021   Lymphedema of both lower extremities 03/22/2011   Metabolic alkalosis 03/28/2021   Microalbuminuria 04/20/2021   Migraine    "monthly" (12/05/2017)   Mild tricuspid regurgitation 04/02/2021   Mitral valve sclerosis 04/02/2021   Mixed hyperlipidemia 04/18/2020   Moderate aortic stenosis 04/20/2021   Mononeuropathy of lower extremity 04/09/2011   Formatting of this note might be different from the original.  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  Severe, corrected by CPAP 18 with C-flex of 3  2/13 bipap 20/14 sm ff mask h/h biflex+3cm  Formatting of this note might be different from the original.  Sleep study 06/2020: IMPRESSIONS  - Moderate obstructive sleep apnea occurred during this study (AHI = 17.7/h), mostly REM related  - No significant central sleep apnea occurred during   On home oxygen  therapy    "2L all the time" (12/05/2017)   On supplemental oxygen  by nasal cannula 04/20/2021   Onychomycosis 06/18/2019   OSA treated with BiPAP    "have CPAP at home too; wearing BiPAP right now" (12/05/2017)   Osteoarthritis 11/28/2010   Qualifier: Diagnosis of   By: Earle Glatter, Scott       Other long term (current) drug therapy 02/12/2022   Pain of left middle finger 06/22/2023   Paroxysmal atrial fibrillation (HCC) 09/04/2021   Physical debility 05/10/2018   Formatting of this note might be different from the original.  Due to morbid obesity   Physical deconditioning 04/24/2021   Pneumonia    "several times" (12/05/2017)   Pneumonia due to gram-positive bacteria 08/24/2017   Pressure injury of both heels, unstageable (HCC) 07/03/2021   Pulmonary edema, acute (HCC) 02/21/2022   Renal insufficiency 04/20/2021   Restless leg syndrome 03/22/2021   Restrictive lung disease secondary to  obesity 01/28/2022   S/P hysterectomy 02/19/2021   Tinea pedis of both feet 11/12/2018   Unspecified atrial flutter (HCC) 02/12/2022   Upper GI bleed 07/18/2022   Venous insufficiency of both lower extremities 07/02/2021   Weakness 03/20/2021   Weight loss 03/15/2021   Yeast infection 08/18/2023    Subjective:   I have reviewed medical records including EPIC notes, labs and imaging, received updates from nursing and Dr. Drexel Gentles, assessed the patient and then met with the patient to discuss diagnosis prognosis, GOC, EOL wishes, disposition and options.  I introduced Palliative Medicine as specialized medical care for people living with serious illness. It focuses on providing relief from symptoms and stress of a serious illness. The goal is to improve quality of life for both the patient and the family.  Today's Discussion: Patient shared her understanding of her chronic conditions and acute hospitalization. We discussed her fragile respiratory status, rehospitalizations (6 in 6 months), and overall failure to thrive.  She shared that she has lived at [facility] the last two years where she is mostly bed bound but gets into the wheelchair with a hoyer a few times a week. She is able to participate minimally in ADLs. She told me she is tired from the rehospitalizations and feels like she can never get ahead of her respiratory failure.  Patient has a partner of 24 years Chelsea [name]. She has four children and 12 grandchildren. Her family bring her joy. She enjoys music-- specifically happy music like the Zydeco genre. When her mobility was better she enjoyed going to concerts.   We discussed advanced directives. The patient has previously completed a HCPOA document naming her partner Chelsea [name] as her proxy Management consultant. She has also completed a living will/declaration for natural death. We discussed code status. Recommended consideration of DNR status, understanding evidenced-based poor  outcomes in similar hospitalized patients, as the cause of the arrest is likely associated with chronic/terminal disease rather than a reversible acute cardio-pulmonary event. Patient stated she has considered changing her code status to DNR as she is not sure she would have a good quality of life after a resuscitation. We discussed scopes of care and the difference between an aggressive medical intervention path and a palliative comfort care path. She has been intubated before and feels she would not want to be intubated moving forward. She wants to discuss both code status and scope of care with her partner before making any changes. We agree to meet tomorrow afternoon to discuss goals of care further.  Discussed the importance of continued conversation with family and the medical providers regarding overall plan of care and treatment options, ensuring decisions are within the context of the patient's values and GOCs.  Questions and concerns were addressed. Hard Choices booklet left for review. The patient was encouraged to call with questions or concerns. PMT will continue to support holistically.  Review of Systems  Constitutional:  Positive for appetite change and fatigue.  Respiratory:  Positive for shortness of breath.     Objective:   Primary Diagnoses: Present on Admission:  Hypokalemia  Chronic iron  deficiency anemia  Hypothyroidism  Acute on chronic diastolic CHF (congestive heart failure) (HCC)  Atrial flutter with rapid ventricular response (HCC)  (Resolved) Acute on chronic diastolic CHF (congestive heart failure) (HCC)  Anxiety   Physical Exam Vitals reviewed.  Constitutional:      General: She is not in acute distress.    Appearance: She is obese.  HENT:     Head: Normocephalic and atraumatic.  Pulmonary:     Effort: Tachypnea present.  Skin:    Findings: Erythema (lower legs) present.  Neurological:     Mental Status: She is alert.     Vital Signs:  BP (!)  106/59 (BP Location: Left Wrist)   Pulse 66   Temp 97.9 F (36.6 C) (Oral)   Resp (!) 22   Ht 5\' 4"  (1.626 m)   Wt (!) 155.1 kg   SpO2 92%   BMI 58.69 kg/m   Palliative Assessment/Data: 30%    Advanced Care Planning:   Existing Vynca/ACP Documentation: HCPOA and Living Will  Primary Decision Maker: PATIENT  Code Status/Advance Care Planning: Full code   Assessment & Plan:   SUMMARY OF RECOMMENDATIONS   Full code and full scope Encouraged patient to continue considering goals of care/code status Plan to meet Sunday 02/09/24 afternoon with patient's significant other to discuss goals of care Continued PMT support  Discussed with: Dr. Drexel Gentles and bedside RN  Time Total: 75 minutes    Thank you for allowing us  to participate in the care of AMILEY CUCCIA PMT will continue to support holistically.   Signed by: Joaquim Muir, NP Palliative Medicine Team  Team Phone # [phone] (Nights/Weekends)  02/08/2024, 4:50 PM

## 2024-02-09 DIAGNOSIS — I5033 Acute on chronic diastolic (congestive) heart failure: Secondary | ICD-10-CM | POA: Diagnosis not present

## 2024-02-09 LAB — CBC
HCT: 29.9 % — ABNORMAL LOW (ref 36.0–46.0)
Hemoglobin: 8.7 g/dL — ABNORMAL LOW (ref 12.0–15.0)
MCH: 26.5 pg (ref 26.0–34.0)
MCHC: 29.1 g/dL — ABNORMAL LOW (ref 30.0–36.0)
MCV: 91.2 fL (ref 80.0–100.0)
Platelets: 335 10*3/uL (ref 150–400)
RBC: 3.28 MIL/uL — ABNORMAL LOW (ref 3.87–5.11)
RDW: 15.1 % (ref 11.5–15.5)
WBC: 8.6 10*3/uL (ref 4.0–10.5)
nRBC: 0 % (ref 0.0–0.2)

## 2024-02-09 LAB — GLUCOSE, CAPILLARY
Glucose-Capillary: 180 mg/dL — ABNORMAL HIGH (ref 70–99)
Glucose-Capillary: 114 mg/dL — ABNORMAL HIGH (ref 70–99)
Glucose-Capillary: 210 mg/dL — ABNORMAL HIGH (ref 70–99)
Glucose-Capillary: 275 mg/dL — ABNORMAL HIGH (ref 70–99)
Glucose-Capillary: 353 mg/dL — ABNORMAL HIGH (ref 70–99)
Glucose-Capillary: 237 mg/dL — ABNORMAL HIGH (ref 70–99)

## 2024-02-09 LAB — BASIC METABOLIC PANEL WITH GFR
Anion gap: 8 (ref 5–15)
BUN: 49 mg/dL — ABNORMAL HIGH (ref 6–20)
CO2: 41 mmol/L — ABNORMAL HIGH (ref 22–32)
Calcium: 8.9 mg/dL (ref 8.9–10.3)
Chloride: 89 mmol/L — ABNORMAL LOW (ref 98–111)
Creatinine, Ser: 0.83 mg/dL (ref 0.44–1.00)
GFR, Estimated: >60 mL/min (ref >60)
Glucose, Bld: 187 mg/dL — ABNORMAL HIGH (ref 70–99)
Potassium: 3.7 mmol/L (ref 3.5–5.1)
Sodium: 138 mmol/L (ref 135–145)

## 2024-02-09 MED ORDER — POTASSIUM CHLORIDE CRYS ER 20 MEQ PO TBCR
40.0000 meq | EXTENDED_RELEASE_TABLET | Freq: Two times a day (BID) | ORAL | Status: AC
Start: 2024-02-09 — End: 2024-02-09
  Administered 2024-02-09 (×2): 40 meq via ORAL
  Filled 2024-02-09 (×2): qty 2

## 2024-02-09 MED ORDER — ACETAZOLAMIDE ER 500 MG PO CP12
500.0000 mg | ORAL_CAPSULE | Freq: Two times a day (BID) | ORAL | Status: DC
  Administered 2024-02-09 – 2024-02-18 (×19): 500 mg via ORAL
  Filled 2024-02-09 (×20): qty 1

## 2024-02-09 MED ORDER — INSULIN ASPART 100 UNIT/ML IJ SOLN
5.0000 [IU] | Freq: Three times a day (TID) | INTRAMUSCULAR | Status: DC
  Administered 2024-02-09 – 2024-02-17 (×25): 5 [IU] via SUBCUTANEOUS

## 2024-02-09 NOTE — Progress Notes (Signed)
 NAME:  Chelsea [name], MRN:  [id number], DOB:  02-24-1966, LOS: 12 ADMISSION DATE:  01/28/2024, CONSULTATION DATE:  01/29/2024 REFERRING MD:  Lind Repine, MD, CHIEF COMPLAINT:  SOB   History of Present Illness:  58 y/o female with PMH for Morbid obesity, A fib, likely OHS with chronic respiratory failure on 5 lioters home O2, COPD, HTN, Hep C, DMT2 who presented with 4 days of worsening SOB.  No fever/chills.  She was placed on BiPAP in ED for PCO2 of 73 and her SOB.  The BiPAP seemed to help and she was also given Lasix  40mg , Rocephin  and Azithromycin .  Patient c/o LE and foot pain and normally takes Oxycodone  3x/day.    Pertinent  Medical History  orbid obesity, A fib, likely OHS with chronic respiratory failure on 5 lioters home O2, COPD, HTN, Hep C, DMT2  Significant Hospital Events: Including procedures, antibiotic start and stop dates in addition to other pertinent events   4/30: can be admitted to floor/stepdown 5/5: compliant with BiPAP 09/08/59%, RR 15, when sleeping and 15 L [place] when awake. Diuresed 3.6 L in the last 48 hrs 5/6: negative 0.5 L fluid balance in the last 24 hrs. On BiPAP 09/08/59%, RR 15, when sleeping and 15 L [place] when awake. Poor oral intake. ?I/S use. Started on IV steroids by primary team  5/7: negative 2.4 L fluid balance in the last 24 hrs. On HFNC 40L/58%, BiPAP PRN 09/07/49%, RR 15. Feels better and uses the I/S 5/8: negative 2.1 L fluid balance in the last 24 hrs. On HFNC 40L/60%, BiPAP PRN 09/07/49%, RR 15. Feels better and uses the I/S. She has itchy eyes. 5/9: negative 1.6 L fluid balance in the last 24 hrs. On HFNC 25L/60%, BiPAP PRN 09/07/54%, RR 15. Feels better and uses the I/S.  5/10: negative 3.4 L fluid balance in the last 24 hrs. On salter [place] 10 L, BiPAP PRN 09/07/54%, RR 15. Uses the I/S. 5/11: On salter [place] 9 L, BiPAP PRN 09/08/39%, RR 15. Uses the I/S Interim History / Subjective:  Stable No CP, cough or wheezing No f/c/r Objective    Blood pressure 103/61, pulse (!) 55, temperature 99.1 F (37.3 C), temperature source Axillary, resp. rate 20, height 5\' 4"  (1.626 m), weight (!) 154.4 kg, SpO2 98%.    Vent Mode: PCV FiO2 (%):  [40 %-55 %] 40 % Set Rate:  [15 bmp] 15 bmp PEEP:  [8 cmH20] 8 cmH20   Intake/Output Summary (Last 24 hours) at 02/09/2024 1314 Last data filed at 02/09/2024 4098 Gross per 24 hour  Intake 480 ml  Output 3550 ml  Net -3070 ml   Filed Weights   02/07/24 0432 02/08/24 0500 02/09/24 0641  Weight: (!) 157.2 kg (!) 155.1 kg (!) 154.4 kg    Examination: Morbidly obese General: alert, oriented x4, and comfortable. SpO2 95%  HENT: PERL, normal pharynx and oral mucosa. No LNE or thyromegaly. No JVD Lungs: symmetrical air entry bilaterally. Scattered crackles. No wheezing Cardiovascular: NL S1/S2. No m/g/r Abdomen: no distension or tenderness Extremities: no edema. Symmetrical. Chronic venous insufficiency  Neuro: nonfocal  Resolved Hospital Problem list   N/a  Assessment & Plan:  Acute on chronic hypercapnic and hypoxic respiratory failure, baseline 5 L [place], due to diastolic CHF exacerbation.  Acute on chronic HFpEF, moderate AS. EF 60% Morbid Obesity OSA/OHS COPD Atrial fibrillation HTN: controlled  Hep C DM-2 Anemia of chronic illness HypoK Itchy eyes  - Continue Lasix , aldactone , Diamox  and fluid  restriction - Monitor daily labs  - Replace K, Mg is fine.  - Budesonide , yupelri  and brovana  nebs - Intermittently bipap dependent and wean O2 as tolerated - I/S - Tracheostomy as recommended in prior consultations, but she declined  - s/p antibiotics ceftriaxone  #5, Zithromax  #3 (finished) - Sputum culture: negative, PCT: low, Resp viral panel: negative  - Speech evaluation: noted - Dietitian consult - Lantus , on steroids per primary team - IV Solumedrol to PO for 3 days, last dose 02/09/2024 - PT/OT - Olopatadine  eye drops - Palliative care consult   PCCM will continue to  follow  Madelynn Schilder, MD [facility] Office: [phone]   See Amion for personal pager PCCM on call pager [phone] until 7pm. Please call Elink 7p-7a. [phone]

## 2024-02-09 NOTE — Progress Notes (Signed)
 PROGRESS NOTE    Chelsea [name]  ZOX:[id number] DOB: [birth year] DOA: 01/28/2024 PCP: Pcp, No  58 y/o female with PMH for Morbid obesity, bedbound SNF resident with morbid obesity, severe OSA/OHS with chronic respiratory failure on 5 liters home O2, diastolic CHF, COPD, HTN, Hep C, DMT2 who presented with 4 days of worsening SOB.  She was placed on BiPAP in ED for PCO2 of 73 and her SOB. The BiPAP seemed to help and she was also given Lasix  40mg , Rocephin  and Azithromycin . Patient c/o LE and foot pain and normally takes Oxycodone  3x/day.   Subjective: - Feels fair, no events overnight, was on BiPAP last night, down to 25 L high flow  Assessment and Plan:  Acute on chronic diastolic CHF -Echo with EF 60 to 65%, RV systolic function preserved, moderate valve aortic stenosis,  -5/04 CT chest with bilateral ground glass opacities, more dense infiltrates at the bases, Small bilateral pleural effusions.  - She is 23.5 L negative, remains on Lasix  60 Mg 3 times daily, Aldactone , pulmonary following  - Toprol  dose lowered yesterday  Acute on chronic hypoxemic and hypercapnic respiratory failure. Obesity hypoventilation syndrome  Bilateral lower lobe pneumonia, more right than left. Possible aspiration.  - Had a high FiO2 needs this admission, 40 L high flow - Oscillates between BiPAP and heated high flow - Completed 5 days of antibiotics, changed from IV steroids to prednisone  -Continue Brovana , Pulmicort , Yupelri  Pulmonary following, low threshold for ICU transfer, tracheostomy -Finally O2 needs slowly improving, wean as tolerated - Palliative consulted as well, - Very poor prognosis, poor quality of life and functional status  Atrial flutter with rapid ventricular response (HCC) Continue Toprol , amiodarone , apixaban   Hypokalemia AKI -repleted  Type 2 diabetes mellitus  Continue basal insulin  10 units.   Chronic iron  deficiency anemia Cell count has been stable    Hypothyroidism Continue levothyroxine    Obesity, class 3 Calculated BMI is 53.3   Anxiety Continue with as needed alprazolam .  Continue with bupropion    Acute on chronic back pain, continue with as needed hydrocodone , DC hydromorphone     DVT prophylaxis: apixaban  Code Status: Full Code Family Communication: None present Disposition Plan: Unknown  Consultants: PCCM   Objective: Vitals:   02/09/24 0006 02/09/24 0329 02/09/24 0641 02/09/24 0806  BP:  111/76  99/63  Pulse: 62 (!) 51 (!) 51 (!) 55  Resp:  20 17 18   Temp:  98.7 F (37.1 C)  97.9 F (36.6 C)  TempSrc:  Oral  Axillary  SpO2:  94% 95% 98%  Weight:   (!) 154.4 kg   Height:        Intake/Output Summary (Last 24 hours) at 02/09/2024 1055 Last data filed at 02/09/2024 8119 Gross per 24 hour  Intake 480 ml  Output 3550 ml  Net -3070 ml   Filed Weights   02/07/24 0432 02/08/24 0500 02/09/24 0641  Weight: (!) 157.2 kg (!) 155.1 kg (!) 154.4 kg    Examination:  Gen: Morbidly obese chronically ill female laying in bed, AAO x 3, remains on BiPAP HEENT: Neck obese unable to assess JVD CVS: S1-S2, regular rhythm Lungs: Distant breath sounds, few scattered rhonchi Abdomen: Soft, obese, nontender, bowel sounds present Extremities: Trace edema, chronic venous stasis changes Skin: As above    Data Reviewed:   CBC: Recent Labs  Lab 02/03/24 0403 02/05/24 0315 02/06/24 0410 02/09/24 0314  WBC 8.4 8.0 7.6 8.6  HGB 9.2* 8.5* 8.3* 8.7*  HCT 32.0* 28.6* 28.3* 29.9*  MCV 93.6 90.5 91.6 91.2  PLT 261 284 314 335   Basic Metabolic Panel: Recent Labs  Lab 02/03/24 0403 02/04/24 0407 02/05/24 0315 02/06/24 0410 02/07/24 0326 02/07/24 0941 02/08/24 0351 02/09/24 0314  NA 139 137 137 136 138  --  138 138  K 3.0* 3.8 3.4* 3.5 3.1*  --  3.4* 3.7  CL 79* 79* 78* 81* 82*  --  87* 89*  CO2 >45* 42* >45* 41* 43*  --  41* 41*  GLUCOSE 163* 170* 235* 203* 169*  --  140* 187*  BUN 58* 60* 64* 70* 63*   --  56* 49*  CREATININE 0.90 0.80 0.92 1.04* 1.09*  --  0.87 0.83  CALCIUM  8.7* 8.1* 9.2 8.8* 8.5*  --  8.6* 8.9  MG 2.2 2.1 2.4  --   --  2.3  --   --    GFR: Estimated Creatinine Clearance: 107.6 mL/min (by C-G formula based on SCr of 0.83 mg/dL). Liver Function Tests: Recent Labs  Lab 02/06/24 0410  AST 12*  ALT 9  ALKPHOS 73  BILITOT 0.6  PROT 7.1  ALBUMIN  2.8*   No results for input(s): "LIPASE", "AMYLASE" in the last 168 hours. No results for input(s): "AMMONIA" in the last 168 hours.  Coagulation Profile: No results for input(s): "INR", "PROTIME" in the last 168 hours.  Cardiac Enzymes: No results for input(s): "CKTOTAL", "CKMB", "CKMBINDEX", "TROPONINI" in the last 168 hours.  BNP (last 3 results) No results for input(s): "PROBNP" in the last 8760 hours. HbA1C: No results for input(s): "HGBA1C" in the last 72 hours. CBG: Recent Labs  Lab 02/08/24 1647 02/08/24 2049 02/08/24 2355 02/09/24 0346 02/09/24 0805  GLUCAP 282* 323* 265* 180* 114*   Lipid Profile: No results for input(s): "CHOL", "HDL", "LDLCALC", "TRIG", "CHOLHDL", "LDLDIRECT" in the last 72 hours. Thyroid Function Tests: No results for input(s): "TSH", "T4TOTAL", "FREET4", "T3FREE", "THYROIDAB" in the last 72 hours. Anemia Panel: No results for input(s): "VITAMINB12", "FOLATE", "FERRITIN", "TIBC", "IRON ", "RETICCTPCT" in the last 72 hours. Urine analysis:    Component Value Date/Time   COLORURINE STRAW (A) 01/29/2024 1446   APPEARANCEUR CLEAR 01/29/2024 1446   LABSPEC 1.006 01/29/2024 1446   PHURINE 6.0 01/29/2024 1446   GLUCOSEU >=500 (A) 01/29/2024 1446   HGBUR NEGATIVE 01/29/2024 1446   BILIRUBINUR NEGATIVE 01/29/2024 1446   KETONESUR NEGATIVE 01/29/2024 1446   PROTEINUR NEGATIVE 01/29/2024 1446   UROBILINOGEN 0.2 12/08/2010 0545   NITRITE NEGATIVE 01/29/2024 1446   LEUKOCYTESUR NEGATIVE 01/29/2024 1446   Sepsis Labs: @LABRCNTIP (procalcitonin:4,lacticidven:4)  ) Recent Results  (from the past 240 hours)  Respiratory (~20 pathogens) panel by PCR     Status: None   Collection Time: 02/02/24  3:41 PM   Specimen: Nasopharyngeal Swab; Respiratory  Result Value Ref Range Status   Adenovirus NOT DETECTED NOT DETECTED Final   Coronavirus 229E NOT DETECTED NOT DETECTED Final    Comment: (NOTE) The Coronavirus on the Respiratory Panel, DOES NOT test for the novel  Coronavirus (2019 nCoV)    Coronavirus HKU1 NOT DETECTED NOT DETECTED Final   Coronavirus NL63 NOT DETECTED NOT DETECTED Final   Coronavirus OC43 NOT DETECTED NOT DETECTED Final   Metapneumovirus NOT DETECTED NOT DETECTED Final   Rhinovirus / Enterovirus NOT DETECTED NOT DETECTED Final   Influenza A NOT DETECTED NOT DETECTED Final   Influenza B NOT DETECTED NOT DETECTED Final   Parainfluenza Virus 1 NOT DETECTED NOT DETECTED Final   Parainfluenza Virus 2 NOT DETECTED NOT DETECTED  Final   Parainfluenza Virus 3 NOT DETECTED NOT DETECTED Final   Parainfluenza Virus 4 NOT DETECTED NOT DETECTED Final   Respiratory Syncytial Virus NOT DETECTED NOT DETECTED Final   Bordetella pertussis NOT DETECTED NOT DETECTED Final   Bordetella Parapertussis NOT DETECTED NOT DETECTED Final   Chlamydophila pneumoniae NOT DETECTED NOT DETECTED Final   Mycoplasma pneumoniae NOT DETECTED NOT DETECTED Final    Comment: Performed at [facility], [address] [place], [place] [address]     Radiology Studies: No results found.   Scheduled Meds:  (feeding supplement) PROSource Plus  30 mL Oral BID BM   acetaZOLAMIDE  ER  500 mg Oral Q12H   amiodarone   200 mg Oral Daily   apixaban   5 mg Oral BID   arformoterol   15 mcg Nebulization BID   atorvastatin   20 mg Oral QPM   budesonide  (PULMICORT ) nebulizer solution  0.5 mg Nebulization BID   buPROPion  ER  100 mg Oral BID   feeding supplement (GLUCERNA SHAKE)  237 mL Oral TID BM   furosemide   60 mg Intravenous Q8H   guaiFENesin   600 mg Oral BID   insulin  aspart  0-9 Units  Subcutaneous Q4H   insulin  aspart  5 Units Subcutaneous TID WC   insulin  glargine-yfgn  13 Units Subcutaneous BID   levothyroxine   150 mcg Oral q AM   metoprolol  succinate  12.5 mg Oral Daily   midodrine   5 mg Oral TID WC   montelukast   10 mg Oral QHS   olopatadine   1 drop Both Eyes BID   potassium chloride   40 mEq Oral BID   revefenacin   175 mcg Nebulization Daily   rOPINIRole   0.5 mg Oral QHS   senna  1 tablet Oral BID   sodium chloride  flush  3 mL Intravenous Q12H   spironolactone   12.5 mg Oral Daily   Continuous Infusions:     LOS: 12 days    Time spent:    Deforest Fast, MD [facility]   02/09/2024, 10:55 AM

## 2024-02-09 NOTE — Plan of Care (Signed)
   Problem: Education: Goal: Ability to describe self-care measures that may prevent or decrease complications (Diabetes Survival Skills Education) will improve Outcome: Progressing Goal: Individualized Educational Video(s) Outcome: Progressing

## 2024-02-09 NOTE — Progress Notes (Addendum)
 Daily Progress Note   Patient Name: Chelsea [name]       Date: 02/09/2024 DOB: Apr 06, 1966  Age: 58 y.o. MRN#: [id number] Attending Physician: Deforest Fast, MD Primary Care Physician: Pcp, No Admit Date: 01/28/2024  Reason for Consultation/Follow-up: Establishing goals of care  Length of Stay: 12  Current Medications: Scheduled Meds:   (feeding supplement) PROSource Plus  30 mL Oral BID BM   acetaZOLAMIDE  ER  500 mg Oral Q12H   amiodarone   200 mg Oral Daily   apixaban   5 mg Oral BID   arformoterol   15 mcg Nebulization BID   atorvastatin   20 mg Oral QPM   budesonide  (PULMICORT ) nebulizer solution  0.5 mg Nebulization BID   buPROPion  ER  100 mg Oral BID   feeding supplement (GLUCERNA SHAKE)  237 mL Oral TID BM   furosemide   60 mg Intravenous Q8H   guaiFENesin   600 mg Oral BID   insulin  aspart  0-9 Units Subcutaneous Q4H   insulin  aspart  5 Units Subcutaneous TID WC   insulin  glargine-yfgn  13 Units Subcutaneous BID   levothyroxine   150 mcg Oral q AM   metoprolol  succinate  12.5 mg Oral Daily   midodrine   5 mg Oral TID WC   montelukast   10 mg Oral QHS   olopatadine   1 drop Both Eyes BID   potassium chloride   40 mEq Oral BID   revefenacin   175 mcg Nebulization Daily   rOPINIRole   0.5 mg Oral QHS   senna  1 tablet Oral BID   sodium chloride  flush  3 mL Intravenous Q12H   spironolactone   12.5 mg Oral Daily    Continuous Infusions:   PRN Meds: acetaminophen  **OR** acetaminophen , albuterol , ALPRAZolam , HYDROcodone -acetaminophen , ondansetron  **OR** ondansetron  (ZOFRAN ) IV, sodium chloride  flush  Physical Exam Vitals reviewed.  Skin:    General: Skin is warm and dry.     Findings: Erythema present.  Neurological:     Mental Status: She is oriented to person, place,  and time.  Psychiatric:        Mood and Affect: Mood normal.        Behavior: Behavior normal.             Vital Signs: BP 103/61 (BP Location: Left Wrist)   Pulse (!) 55   Temp 99.1 F (37.3  C) (Axillary)   Resp 20   Ht 5\' 4"  (1.626 m)   Wt (!) 154.4 kg   SpO2 98%   BMI 58.43 kg/m  SpO2: SpO2: 98 % O2 Device: O2 Device: Nasal Cannula O2 Flow Rate: O2 Flow Rate (L/min): 10 L/min      Palliative Assessment/Data: 30%      Patient Active Problem List   Diagnosis Date Noted   Obesity, class 3 01/29/2024   Acute on chronic diastolic CHF (congestive heart failure) (HCC) 01/28/2024   Nocturnal hypoxemia due to obesity 11/23/2023   Atrial fibrillation with RVR (HCC) 11/09/2023   H1N1 influenza 11/07/2023   Acute on chronic hypoxic respiratory failure (HCC) 11/04/2023   Abnormal CT scan of lung 09/22/2023   History of urinary retention 09/20/2023   Type 2 diabetes mellitus with diabetic neuropathy, unspecified (HCC) 08/16/2023   Hypokalemia 06/15/2023   Anxiety 05/30/2023   Atrial flutter with rapid ventricular response (HCC) 05/29/2023   Arthritis 11/13/2022   Asthma 11/13/2022   Chronic lower back pain 11/13/2022   Complication of anesthesia 11/13/2022   Coronary artery disease 11/13/2022   (HFpEF) heart failure with preserved ejection fraction (HCC) 11/13/2022   Migraine 11/13/2022   On home oxygen  therapy 11/13/2022   Chronic iron  deficiency anemia 09/18/2022   Acute on chronic respiratory failure with hypoxia (HCC) 09/17/2022   Chronic disease anemia 08/01/2022   COPD (chronic obstructive pulmonary disease) with chronic bronchitis (HCC) 07/18/2022   Chronic respiratory failure with hypercapnia (HCC) 02/12/2022   Restrictive lung disease secondary to obesity 01/28/2022   Limitation of activity due to disability 09/18/2021   DJD (degenerative joint disease) 07/03/2021   Pressure injury of both heels, unstageable (HCC) 07/03/2021   Venous stasis dermatitis of both  lower extremities 07/02/2021   Depression 04/20/2021   History of tobacco use 04/20/2021   Irritable bowel syndrome with constipation 04/20/2021   Microalbuminuria 04/20/2021   Moderate aortic stenosis 04/20/2021   Awaiting admission to adequate facility elsewhere 03/28/2021   Restless leg syndrome 03/22/2021   Atrial flutter (HCC) 03/09/2021   Pulmonary nodule less than 6 cm determined by computed tomography of lung 02/09/2021   Nocturnal hypoxemia 06/03/2020   Hair loss 09/17/2019   Chronic pain of multiple joints 06/18/2019   Onychomycosis 06/18/2019   Hypertension associated with diabetes (HCC) 08/25/2018   Class 3 severe obesity due to excess calories with serious comorbidity and body mass index (BMI) of 60.0 to 69.9 in adult 08/25/2018   Hepatitis C    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   OSA on CPAP 02/24/2018   Hyperlipidemia associated with type 2 diabetes mellitus (HCC) 09/28/2015   Mononeuropathy of lower extremity 04/09/2011   Lymphedema 03/22/2011   Obesity hypoventilation syndrome (HCC) 01/01/2011   Acute on chronic diastolic heart failure (HCC) 11/28/2010   Osteoarthritis 11/28/2010   Hypothyroidism 11/13/2010   GERD 11/10/2010   Type 2 diabetes mellitus with hyperlipidemia (HCC) 11/10/2010    Palliative Care Assessment & Plan   Patient Profile: 58 y.o. female  with past medical history of DM2, morbid obesity, obesity hypoventilation syndrome, heart failure, chronic respiratory failure on 5L baseline and BiPAP at night, HTN, hep C, COPD, afib, and CHF  admitted on 01/28/2024 with four days of worsening shortness of breath.    Today's Discussion: Patient resting in bed listening to music in NAD. She stated she is okay today. Shared her partner will not be coming to the hospital today as his plans changed.  He plans to visit tomorrow afternoon. I share that I will have a colleague follow up tomorrow if they are available. She has no questions or  concerns about our discussion yesterday around goals. She still wants her partner involved in discussion before she makes any changes.  Encouraged her to call PMT with questions or concerns.  Recommendations/Plan: Full code and full scope Encouraged patient to continue considering goals of care/code status Plan to meet with the patient and her patient's significant other to discuss goals of care- day/time TBD Continued PMT support    Code Status:    Code Status Orders  (From admission, onward)           Start     Ordered   01/28/24 2227  Full code  Continuous       Question:  By:  Answer:  Consent: discussion documented in EHR   01/28/24 2227           Extensive chart review has been completed prior to seeing the patient including labs, vital signs, imaging, progress/consult notes, orders, medications, and available advance directive documents.  Care plan was discussed with Dr. Drexel Gentles and bedside RN  Time spent: 25 minutes  Thank you for allowing the Palliative Medicine Team to assist in the care of this patient.    Daina Drum, NP  Please contact Palliative Medicine Team phone at [phone] for questions and concerns.

## 2024-02-10 DIAGNOSIS — I5033 Acute on chronic diastolic (congestive) heart failure: Secondary | ICD-10-CM | POA: Diagnosis not present

## 2024-02-10 LAB — BASIC METABOLIC PANEL WITH GFR
Anion gap: 11 (ref 5–15)
BUN: 47 mg/dL — ABNORMAL HIGH (ref 6–20)
CO2: 35 mmol/L — ABNORMAL HIGH (ref 22–32)
Calcium: 8.9 mg/dL (ref 8.9–10.3)
Chloride: 93 mmol/L — ABNORMAL LOW (ref 98–111)
Creatinine, Ser: 0.74 mg/dL (ref 0.44–1.00)
GFR, Estimated: >60 mL/min (ref >60)
Glucose, Bld: 128 mg/dL — ABNORMAL HIGH (ref 70–99)
Potassium: 4.1 mmol/L (ref 3.5–5.1)
Sodium: 139 mmol/L (ref 135–145)

## 2024-02-10 LAB — GLUCOSE, CAPILLARY
Glucose-Capillary: 119 mg/dL — ABNORMAL HIGH (ref 70–99)
Glucose-Capillary: 106 mg/dL — ABNORMAL HIGH (ref 70–99)
Glucose-Capillary: 238 mg/dL — ABNORMAL HIGH (ref 70–99)
Glucose-Capillary: 190 mg/dL — ABNORMAL HIGH (ref 70–99)
Glucose-Capillary: 266 mg/dL — ABNORMAL HIGH (ref 70–99)
Glucose-Capillary: 217 mg/dL — ABNORMAL HIGH (ref 70–99)

## 2024-02-10 NOTE — Plan of Care (Signed)
  Problem: Education: Goal: Ability to describe self-care measures that may prevent or decrease complications (Diabetes Survival Skills Education) will improve Outcome: Progressing Goal: Individualized Educational Video(s) Outcome: Progressing   Problem: Coping: Goal: Ability to adjust to condition or change in health will improve Outcome: Progressing   Problem: Fluid Volume: Goal: Ability to maintain a balanced intake and output will improve Outcome: Progressing   Problem: Health Behavior/Discharge Planning: Goal: Ability to identify and utilize available resources and services will improve Outcome: Progressing Goal: Ability to manage health-related needs will improve Outcome: Progressing   Problem: Metabolic: Goal: Ability to maintain appropriate glucose levels will improve Outcome: Progressing   Problem: Nutritional: Goal: Maintenance of adequate nutrition will improve Outcome: Progressing Goal: Progress toward achieving an optimal weight will improve Outcome: Progressing   Problem: Skin Integrity: Goal: Risk for impaired skin integrity will decrease Outcome: Progressing   Problem: Education: Goal: Knowledge of General Education information will improve Description: Including pain rating scale, medication(s)/side effects and non-pharmacologic comfort measures Outcome: Progressing   Problem: Health Behavior/Discharge Planning: Goal: Ability to manage health-related needs will improve Outcome: Progressing   Problem: Clinical Measurements: Goal: Ability to maintain clinical measurements within normal limits will improve Outcome: Progressing Goal: Will remain free from infection Outcome: Progressing Goal: Diagnostic test results will improve Outcome: Progressing Goal: Respiratory complications will improve Outcome: Progressing Goal: Cardiovascular complication will be avoided Outcome: Progressing

## 2024-02-10 NOTE — Progress Notes (Signed)
 NAME:  Chelsea [name], MRN:  [id number], DOB:  Oct 18, 1965, LOS: 13 ADMISSION DATE:  01/28/2024, CONSULTATION DATE:  01/29/2024 REFERRING MD:  Lind Repine, MD, CHIEF COMPLAINT:  SOB   History of Present Illness:  58 y/o female with PMH for Morbid obesity, A fib, likely OHS with chronic respiratory failure on 5 lioters home O2, COPD, HTN, Hep C, DMT2 who presented with 4 days of worsening SOB.  No fever/chills.  She was placed on BiPAP in ED for PCO2 of 73 and her SOB.  The BiPAP seemed to help and she was also given Lasix  40mg , Rocephin  and Azithromycin .  Patient c/o LE and foot pain and normally takes Oxycodone  3x/day.    Pertinent  Medical History  orbid obesity, A fib, likely OHS with chronic respiratory failure on 5 lioters home O2, COPD, HTN, Hep C, DMT2  Significant Hospital Events: Including procedures, antibiotic start and stop dates in addition to other pertinent events   4/30: can be admitted to floor/stepdown 5/5: compliant with BiPAP 09/08/59%, RR 15, when sleeping and 15 L [place] when awake. Diuresed 3.6 L in the last 48 hrs 5/6: negative 0.5 L fluid balance in the last 24 hrs. On BiPAP 09/08/59%, RR 15, when sleeping and 15 L [place] when awake. Poor oral intake. ?I/S use. Started on IV steroids by primary team  5/7: negative 2.4 L fluid balance in the last 24 hrs. On HFNC 40L/58%, BiPAP PRN 09/07/49%, RR 15. Feels better and uses the I/S 5/8: negative 2.1 L fluid balance in the last 24 hrs. On HFNC 40L/60%, BiPAP PRN 09/07/49%, RR 15. Feels better and uses the I/S. She has itchy eyes. 5/9: negative 1.6 L fluid balance in the last 24 hrs. On HFNC 25L/60%, BiPAP PRN 09/07/54%, RR 15. Feels better and uses the I/S.  5/10: negative 3.4 L fluid balance in the last 24 hrs. On salter [place] 10 L, BiPAP PRN 09/07/54%, RR 15. Uses the I/S. 5/11: On salter [place] 9 L, BiPAP PRN 09/08/39%, RR 15. Uses the I/S 5/12: On salter [place] 7 L, BiPAP PRN 09/08/39%, RR 15. Uses the I/S. Negative 1.3 L fluid balance  in the last 24 hrs Interim History / Subjective:  Stable No CP, cough or wheezing No f/c/r Objective   Blood pressure 111/69, pulse 67, temperature (!) 97.2 F (36.2 C), temperature source Axillary, resp. rate 18, height 5\' 4"  (1.626 m), weight (!) 155 kg, SpO2 100%.    Vent Mode: BIPAP FiO2 (%):  [40 %] 40 % Set Rate:  [15 bmp] 15 bmp PEEP:  [8 cmH20] 8 cmH20   Intake/Output Summary (Last 24 hours) at 02/10/2024 1059 Last data filed at 02/10/2024 0800 Gross per 24 hour  Intake --  Output 2400 ml  Net -2400 ml   Filed Weights   02/08/24 0500 02/09/24 0641 02/10/24 0428  Weight: (!) 155.1 kg (!) 154.4 kg (!) 155 kg    Examination: Morbidly obese General: alert, oriented x4, and comfortable. SpO2 95%  HENT: PERL, normal pharynx and oral mucosa. No LNE or thyromegaly. No JVD Lungs: symmetrical air entry bilaterally. Scattered crackles. No wheezing Cardiovascular: NL S1/S2. No m/g/r Abdomen: no distension or tenderness Extremities: no edema. Symmetrical. Chronic venous insufficiency  Neuro: nonfocal  Resolved Hospital Problem list   N/a  Assessment & Plan:  Acute on chronic hypercapnic and hypoxic respiratory failure, baseline 5 L [place], due to diastolic CHF exacerbation.  - s/p antibiotics ceftriaxone  #5, Zithromax  #3 (finished) - s/p steroids for one  week - Sputum culture: negative, PCT: low, Resp viral panel: negative  - Dietitian and speech evaluation: noted Acute on chronic HFpEF, moderate AS. EF 60% Morbid Obesity OSA/OHS COPD Atrial fibrillation HTN: controlled  Hep C DM-2 Anemia of chronic illness HypoK Itchy eyes  - Continue Lasix , aldactone , Diamox  and fluid restriction - Monitor daily labs  - Budesonide , yupelri  and brovana  nebs - Intermittently bipap dependent and wean O2 as tolerated - I/S - Tracheostomy as recommended in prior consultations, but she declined  - PT/OT - Palliative care consult   PCCM will continue to follow  Madelynn Schilder,  MD [facility] Office: [phone]   See Amion for personal pager PCCM on call pager [phone] until 7pm. Please call Elink 7p-7a. [phone]

## 2024-02-10 NOTE — Progress Notes (Signed)
 PROGRESS NOTE    Chelsea [name]  ZOX:[id number] DOB: [birth year] DOA: 01/28/2024 PCP: Pcp, No  58 y/o female with PMH for Morbid obesity, bedbound SNF resident with morbid obesity, severe OSA/OHS with chronic respiratory failure on 5 liters home O2, diastolic CHF, COPD, HTN, Hep C, DMT2 who presented with 4 days of worsening SOB.  She was placed on BiPAP in ED for PCO2 of 73 and her SOB. The BiPAP seemed to help and she was also given Lasix  40mg , Rocephin  and Azithromycin . Patient c/o LE and foot pain and normally takes Oxycodone  3x/day.  - Prolonged hospitalization with significant hypoxia and respiratory failure - Slowly improving with diuresis,  Subjective: - Feels fair, no events overnight, tolerated nocturnal BiPAP, down to 7 L high flow this morning  Assessment and Plan:  Acute on chronic diastolic CHF -Echo with EF 60 to 65%, RV systolic function preserved, moderate valve aortic stenosis,  -5/04 CT chest with bilateral ground glass opacities, more dense infiltrates at the bases, Small bilateral pleural effusions.  - She is 24.6 L negative, remains on Lasix  60 Mg 3 times daily, Aldactone , pulmonary following  - Toprol  dose lowered - O2 needs are improving  Acute on chronic hypoxemic and hypercapnic respiratory failure. Obesity hypoventilation syndrome  Bilateral lower lobe pneumonia, more right than left. Possible aspiration.  - Had a high FiO2 needs this admission, 40 L high flow - Was oscillating  between BiPAP and heated high flow - Completed 5 days of antibiotics, completed course of steroids as well -Continue Brovana , Pulmicort , Yupelri  -Pulmonary following -Finally O2 needs slowly improving, hopefully can wean off high flow today, on 5L [place] a at baseline - Palliative consulted as well, - Very poor prognosis, poor quality of life and functional status  Atrial flutter with rapid ventricular response (HCC) Continue Toprol , amiodarone ,  apixaban   Hypokalemia AKI -repleted  Type 2 diabetes mellitus  Continue basal insulin  10 units.   Chronic iron  deficiency anemia Cell count has been stable   Hypothyroidism Continue levothyroxine    Obesity, class 3 Calculated BMI is 53.3   Anxiety Continue with as needed alprazolam .  Continue with bupropion    Acute on chronic back pain, continue with as needed hydrocodone , DC hydromorphone   DVT prophylaxis: apixaban  Code Status: Full Code Family Communication: None present Disposition Plan: Unknown  Consultants: PCCM   Objective: Vitals:   02/10/24 0157 02/10/24 0428 02/10/24 0812 02/10/24 0816  BP:  99/63 111/69   Pulse: (!) 57 (!) 55 61 62  Resp: 17 15 20  (!) 24  Temp:  (!) 97.2 F (36.2 C)    TempSrc:  Axillary    SpO2: 92% 96% 99% 96%  Weight:  (!) 155 kg    Height:        Intake/Output Summary (Last 24 hours) at 02/10/2024 0945 Last data filed at 02/10/2024 0800 Gross per 24 hour  Intake --  Output 2400 ml  Net -2400 ml   Filed Weights   02/08/24 0500 02/09/24 0641 02/10/24 0428  Weight: (!) 155.1 kg (!) 154.4 kg (!) 155 kg    Examination:  Gen: Morbidly obese chronically ill female laying in bed, AAO x 3, remains on BiPAP HEENT: Neck obese unable to assess JVD CVS: S1-S2, regular rhythm Lungs: Distant breath sounds, few scattered rhonchi Abdomen: Soft, obese, nontender, bowel sounds present Extremities: Trace edema, chronic venous stasis changes Skin: As above    Data Reviewed:   CBC: Recent Labs  Lab 02/05/24 0315 02/06/24 0410 02/09/24 0314  WBC  8.0 7.6 8.6  HGB 8.5* 8.3* 8.7*  HCT 28.6* 28.3* 29.9*  MCV 90.5 91.6 91.2  PLT 284 314 335   Basic Metabolic Panel: Recent Labs  Lab 02/04/24 0407 02/05/24 0315 02/06/24 0410 02/07/24 0326 02/07/24 0941 02/08/24 0351 02/09/24 0314 02/10/24 0349  NA 137 137 136 138  --  138 138 139  K 3.8 3.4* 3.5 3.1*  --  3.4* 3.7 4.1  CL 79* 78* 81* 82*  --  87* 89* 93*  CO2 42* >45*  41* 43*  --  41* 41* 35*  GLUCOSE 170* 235* 203* 169*  --  140* 187* 128*  BUN 60* 64* 70* 63*  --  56* 49* 47*  CREATININE 0.80 0.92 1.04* 1.09*  --  0.87 0.83 0.74  CALCIUM  8.1* 9.2 8.8* 8.5*  --  8.6* 8.9 8.9  MG 2.1 2.4  --   --  2.3  --   --   --    GFR: Estimated Creatinine Clearance: 111.9 mL/min (by C-G formula based on SCr of 0.74 mg/dL). Liver Function Tests: Recent Labs  Lab 02/06/24 0410  AST 12*  ALT 9  ALKPHOS 73  BILITOT 0.6  PROT 7.1  ALBUMIN  2.8*   No results for input(s): "LIPASE", "AMYLASE" in the last 168 hours. No results for input(s): "AMMONIA" in the last 168 hours.  Coagulation Profile: No results for input(s): "INR", "PROTIME" in the last 168 hours.  Cardiac Enzymes: No results for input(s): "CKTOTAL", "CKMB", "CKMBINDEX", "TROPONINI" in the last 168 hours.  BNP (last 3 results) No results for input(s): "PROBNP" in the last 8760 hours. HbA1C: No results for input(s): "HGBA1C" in the last 72 hours. CBG: Recent Labs  Lab 02/09/24 1621 02/09/24 1933 02/09/24 2318 02/10/24 0426 02/10/24 0810  GLUCAP 275* 353* 237* 119* 106*   Lipid Profile: No results for input(s): "CHOL", "HDL", "LDLCALC", "TRIG", "CHOLHDL", "LDLDIRECT" in the last 72 hours. Thyroid Function Tests: No results for input(s): "TSH", "T4TOTAL", "FREET4", "T3FREE", "THYROIDAB" in the last 72 hours. Anemia Panel: No results for input(s): "VITAMINB12", "FOLATE", "FERRITIN", "TIBC", "IRON ", "RETICCTPCT" in the last 72 hours. Urine analysis:    Component Value Date/Time   COLORURINE STRAW (A) 01/29/2024 1446   APPEARANCEUR CLEAR 01/29/2024 1446   LABSPEC 1.006 01/29/2024 1446   PHURINE 6.0 01/29/2024 1446   GLUCOSEU >=500 (A) 01/29/2024 1446   HGBUR NEGATIVE 01/29/2024 1446   BILIRUBINUR NEGATIVE 01/29/2024 1446   KETONESUR NEGATIVE 01/29/2024 1446   PROTEINUR NEGATIVE 01/29/2024 1446   UROBILINOGEN 0.2 12/08/2010 0545   NITRITE NEGATIVE 01/29/2024 1446   LEUKOCYTESUR  NEGATIVE 01/29/2024 1446   Sepsis Labs: @LABRCNTIP (procalcitonin:4,lacticidven:4)  ) Recent Results (from the past 240 hours)  Respiratory (~20 pathogens) panel by PCR     Status: None   Collection Time: 02/02/24  3:41 PM   Specimen: Nasopharyngeal Swab; Respiratory  Result Value Ref Range Status   Adenovirus NOT DETECTED NOT DETECTED Final   Coronavirus 229E NOT DETECTED NOT DETECTED Final    Comment: (NOTE) The Coronavirus on the Respiratory Panel, DOES NOT test for the novel  Coronavirus (2019 nCoV)    Coronavirus HKU1 NOT DETECTED NOT DETECTED Final   Coronavirus NL63 NOT DETECTED NOT DETECTED Final   Coronavirus OC43 NOT DETECTED NOT DETECTED Final   Metapneumovirus NOT DETECTED NOT DETECTED Final   Rhinovirus / Enterovirus NOT DETECTED NOT DETECTED Final   Influenza A NOT DETECTED NOT DETECTED Final   Influenza B NOT DETECTED NOT DETECTED Final   Parainfluenza Virus 1  NOT DETECTED NOT DETECTED Final   Parainfluenza Virus 2 NOT DETECTED NOT DETECTED Final   Parainfluenza Virus 3 NOT DETECTED NOT DETECTED Final   Parainfluenza Virus 4 NOT DETECTED NOT DETECTED Final   Respiratory Syncytial Virus NOT DETECTED NOT DETECTED Final   Bordetella pertussis NOT DETECTED NOT DETECTED Final   Bordetella Parapertussis NOT DETECTED NOT DETECTED Final   Chlamydophila pneumoniae NOT DETECTED NOT DETECTED Final   Mycoplasma pneumoniae NOT DETECTED NOT DETECTED Final    Comment: Performed at [facility], [address], [place], [place] [address]     Radiology Studies: No results found.   Scheduled Meds:  (feeding supplement) PROSource Plus  30 mL Oral BID BM   acetaZOLAMIDE  ER  500 mg Oral Q12H   amiodarone   200 mg Oral Daily   apixaban   5 mg Oral BID   arformoterol   15 mcg Nebulization BID   atorvastatin   20 mg Oral QPM   budesonide  (PULMICORT ) nebulizer solution  0.5 mg Nebulization BID   buPROPion  ER  100 mg Oral BID   feeding supplement (GLUCERNA SHAKE)  237 mL Oral  TID BM   furosemide   60 mg Intravenous Q8H   guaiFENesin   600 mg Oral BID   insulin  aspart  0-9 Units Subcutaneous Q4H   insulin  aspart  5 Units Subcutaneous TID WC   insulin  glargine-yfgn  13 Units Subcutaneous BID   levothyroxine   150 mcg Oral q AM   metoprolol  succinate  12.5 mg Oral Daily   midodrine   5 mg Oral TID WC   montelukast   10 mg Oral QHS   olopatadine   1 drop Both Eyes BID   revefenacin   175 mcg Nebulization Daily   rOPINIRole   0.5 mg Oral QHS   senna  1 tablet Oral BID   sodium chloride  flush  3 mL Intravenous Q12H   spironolactone   12.5 mg Oral Daily   Continuous Infusions:     LOS: 13 days    Time spent:    Deforest Fast, MD [facility]   02/10/2024, 9:45 AM

## 2024-02-11 DIAGNOSIS — I5033 Acute on chronic diastolic (congestive) heart failure: Secondary | ICD-10-CM | POA: Diagnosis not present

## 2024-02-11 LAB — BASIC METABOLIC PANEL WITH GFR
Anion gap: 8 (ref 5–15)
BUN: 51 mg/dL — ABNORMAL HIGH (ref 6–20)
CO2: 35 mmol/L — ABNORMAL HIGH (ref 22–32)
Calcium: 8.5 mg/dL — ABNORMAL LOW (ref 8.9–10.3)
Chloride: 94 mmol/L — ABNORMAL LOW (ref 98–111)
Creatinine, Ser: 0.98 mg/dL (ref 0.44–1.00)
GFR, Estimated: >60 mL/min (ref >60)
Glucose, Bld: 173 mg/dL — ABNORMAL HIGH (ref 70–99)
Potassium: 3.7 mmol/L (ref 3.5–5.1)
Sodium: 137 mmol/L (ref 135–145)

## 2024-02-11 LAB — GLUCOSE, CAPILLARY
Glucose-Capillary: 145 mg/dL — ABNORMAL HIGH (ref 70–99)
Glucose-Capillary: 173 mg/dL — ABNORMAL HIGH (ref 70–99)
Glucose-Capillary: 211 mg/dL — ABNORMAL HIGH (ref 70–99)
Glucose-Capillary: 219 mg/dL — ABNORMAL HIGH (ref 70–99)
Glucose-Capillary: 241 mg/dL — ABNORMAL HIGH (ref 70–99)
Glucose-Capillary: 266 mg/dL — ABNORMAL HIGH (ref 70–99)

## 2024-02-11 MED ORDER — POTASSIUM CHLORIDE 20 MEQ PO PACK
20.0000 meq | PACK | Freq: Two times a day (BID) | ORAL | Status: DC
  Administered 2024-02-11 – 2024-02-12 (×2): 20 meq via ORAL
  Filled 2024-02-11 (×4): qty 1

## 2024-02-11 MED ORDER — POTASSIUM CHLORIDE CRYS ER 20 MEQ PO TBCR
40.0000 meq | EXTENDED_RELEASE_TABLET | Freq: Once | ORAL | Status: AC
  Administered 2024-02-11: 40 meq via ORAL
  Filled 2024-02-11: qty 2

## 2024-02-11 NOTE — Progress Notes (Signed)
 PROGRESS NOTE    Chelsea [name]  NWG:[id number] DOB: July 12, 1966 DOA: 01/28/2024 PCP: Pcp, No  58 y/o female with PMH for Morbid obesity, bedbound SNF resident with morbid obesity, severe OSA/OHS with chronic respiratory failure on 5 liters home O2, diastolic CHF, COPD, HTN, Hep C, DMT2 who presented with 4 days of worsening SOB.  She was placed on BiPAP in ED for PCO2 of 73 and her SOB. The BiPAP seemed to help and she was also given Lasix  40mg , Rocephin  and Azithromycin . Patient c/o LE and foot pain and normally takes Oxycodone  3x/day.  - Prolonged hospitalization with significant hypoxia and respiratory failure - Slowly improving with diuresis> FiO2 needs now markedly better,  Subjective: - Feels okay overall, breathing slowly improving, no events overnight  Assessment and Plan:  Acute on chronic diastolic CHF -Echo with EF 60 to 65%, RV systolic function preserved, moderate valve aortic stenosis,  -5/04 CT chest with bilateral ground glass opacities, more dense infiltrates at the bases, Small bilateral pleural effusions.  - She is 27.5 L negative, remains on Lasix  60 Mg 3 times daily, Aldactone , pulmonary following  - Toprol  dose lowered - O2 needs are improving  Acute on chronic hypoxemic and hypercapnic respiratory failure. Obesity hypoventilation syndrome  Bilateral lower lobe pneumonia, more right than left. Possible aspiration.  - First 10-12 days, had very high FiO2 , up to 40 L high flow - Was oscillating  between BiPAP and heated high flow - Completed 5 days of antibiotics, completed course of steroids as well -Continue Brovana , Pulmicort , Yupelri  -Pulmonary following -Finally O2 needs slowly improving, attempting to wean off high flow back to nasal cannula today - Palliative consulted as well, - Very poor prognosis, poor quality of life and functional status - Anticipate discharge to SNF soon  Atrial flutter with rapid ventricular response (HCC) Continue Toprol ,  amiodarone , apixaban   Hypokalemia AKI -repleted  Type 2 diabetes mellitus  Continue basal insulin  10 units.   Chronic iron  deficiency anemia Cell count has been stable   Hypothyroidism Continue levothyroxine    Obesity, class 3 Calculated BMI is 53.3   Anxiety Continue with as needed alprazolam .  Continue with bupropion    Acute on chronic back pain, continue with as needed hydrocodone , DC hydromorphone   DVT prophylaxis: apixaban  Code Status: Full Code Family Communication: None present Disposition Plan: Unknown  Consultants: PCCM   Objective: Vitals:   02/11/24 0124 02/11/24 0420 02/11/24 0729 02/11/24 0833  BP:  (!) 102/53 106/61   Pulse: (!) 58 (!) 54 (!) 54 65  Resp: 19 20 19    Temp:  (!) 97 F (36.1 C) 97.7 F (36.5 C)   TempSrc:  Axillary Axillary   SpO2: 92% 93% 94%   Weight:  (!) 153.9 kg    Height:        Intake/Output Summary (Last 24 hours) at 02/11/2024 0930 Last data filed at 02/11/2024 5784 Gross per 24 hour  Intake 957 ml  Output 2800 ml  Net -1843 ml   Filed Weights   02/09/24 0641 02/10/24 0428 02/11/24 0420  Weight: (!) 154.4 kg (!) 155 kg (!) 153.9 kg    Examination:  Gen: Morbidly obese chronically ill female laying in bed, AAO x 3, remains on BiPAP HEENT: Neck obese unable to assess JVD CVS: S1-S2, regular rhythm Lungs: Distant breath sounds, few scattered rhonchi Abdomen: Soft, obese, nontender, bowel sounds present Extremities: Trace edema, chronic venous stasis changes Skin: As above    Data Reviewed:   CBC: Recent Labs  Lab 02/05/24 0315 02/06/24 0410 02/09/24 0314  WBC 8.0 7.6 8.6  HGB 8.5* 8.3* 8.7*  HCT 28.6* 28.3* 29.9*  MCV 90.5 91.6 91.2  PLT 284 314 335   Basic Metabolic Panel: Recent Labs  Lab 02/05/24 0315 02/06/24 0410 02/07/24 0326 02/07/24 0941 02/08/24 0351 02/09/24 0314 02/10/24 0349 02/11/24 0437  NA 137   < > 138  --  138 138 139 137  K 3.4*   < > 3.1*  --  3.4* 3.7 4.1 3.7  CL 78*    < > 82*  --  87* 89* 93* 94*  CO2 >45*   < > 43*  --  41* 41* 35* 35*  GLUCOSE 235*   < > 169*  --  140* 187* 128* 173*  BUN 64*   < > 63*  --  56* 49* 47* 51*  CREATININE 0.92   < > 1.09*  --  0.87 0.83 0.74 0.98  CALCIUM  9.2   < > 8.5*  --  8.6* 8.9 8.9 8.5*  MG 2.4  --   --  2.3  --   --   --   --    < > = values in this interval not displayed.   GFR: Estimated Creatinine Clearance: 91 mL/min (by C-G formula based on SCr of 0.98 mg/dL). Liver Function Tests: Recent Labs  Lab 02/06/24 0410  AST 12*  ALT 9  ALKPHOS 73  BILITOT 0.6  PROT 7.1  ALBUMIN  2.8*   No results for input(s): "LIPASE", "AMYLASE" in the last 168 hours. No results for input(s): "AMMONIA" in the last 168 hours.  Coagulation Profile: No results for input(s): "INR", "PROTIME" in the last 168 hours.  Cardiac Enzymes: No results for input(s): "CKTOTAL", "CKMB", "CKMBINDEX", "TROPONINI" in the last 168 hours.  BNP (last 3 results) No results for input(s): "PROBNP" in the last 8760 hours. HbA1C: No results for input(s): "HGBA1C" in the last 72 hours. CBG: Recent Labs  Lab 02/10/24 1649 02/10/24 2003 02/10/24 2324 02/11/24 0418 02/11/24 0726  GLUCAP 190* 266* 217* 173* 145*   Lipid Profile: No results for input(s): "CHOL", "HDL", "LDLCALC", "TRIG", "CHOLHDL", "LDLDIRECT" in the last 72 hours. Thyroid Function Tests: No results for input(s): "TSH", "T4TOTAL", "FREET4", "T3FREE", "THYROIDAB" in the last 72 hours. Anemia Panel: No results for input(s): "VITAMINB12", "FOLATE", "FERRITIN", "TIBC", "IRON ", "RETICCTPCT" in the last 72 hours. Urine analysis:    Component Value Date/Time   COLORURINE STRAW (A) 01/29/2024 1446   APPEARANCEUR CLEAR 01/29/2024 1446   LABSPEC 1.006 01/29/2024 1446   PHURINE 6.0 01/29/2024 1446   GLUCOSEU >=500 (A) 01/29/2024 1446   HGBUR NEGATIVE 01/29/2024 1446   BILIRUBINUR NEGATIVE 01/29/2024 1446   KETONESUR NEGATIVE 01/29/2024 1446   PROTEINUR NEGATIVE 01/29/2024  1446   UROBILINOGEN 0.2 12/08/2010 0545   NITRITE NEGATIVE 01/29/2024 1446   LEUKOCYTESUR NEGATIVE 01/29/2024 1446   Sepsis Labs: @LABRCNTIP (procalcitonin:4,lacticidven:4)  ) Recent Results (from the past 240 hours)  Respiratory (~20 pathogens) panel by PCR     Status: None   Collection Time: 02/02/24  3:41 PM   Specimen: Nasopharyngeal Swab; Respiratory  Result Value Ref Range Status   Adenovirus NOT DETECTED NOT DETECTED Final   Coronavirus 229E NOT DETECTED NOT DETECTED Final    Comment: (NOTE) The Coronavirus on the Respiratory Panel, DOES NOT test for the novel  Coronavirus (2019 nCoV)    Coronavirus HKU1 NOT DETECTED NOT DETECTED Final   Coronavirus NL63 NOT DETECTED NOT DETECTED Final   Coronavirus OC43  NOT DETECTED NOT DETECTED Final   Metapneumovirus NOT DETECTED NOT DETECTED Final   Rhinovirus / Enterovirus NOT DETECTED NOT DETECTED Final   Influenza A NOT DETECTED NOT DETECTED Final   Influenza B NOT DETECTED NOT DETECTED Final   Parainfluenza Virus 1 NOT DETECTED NOT DETECTED Final   Parainfluenza Virus 2 NOT DETECTED NOT DETECTED Final   Parainfluenza Virus 3 NOT DETECTED NOT DETECTED Final   Parainfluenza Virus 4 NOT DETECTED NOT DETECTED Final   Respiratory Syncytial Virus NOT DETECTED NOT DETECTED Final   Bordetella pertussis NOT DETECTED NOT DETECTED Final   Bordetella Parapertussis NOT DETECTED NOT DETECTED Final   Chlamydophila pneumoniae NOT DETECTED NOT DETECTED Final   Mycoplasma pneumoniae NOT DETECTED NOT DETECTED Final    Comment: Performed at [facility], [address], [place], [place] [address]     Radiology Studies: No results found.   Scheduled Meds:  (feeding supplement) PROSource Plus  30 mL Oral BID BM   acetaZOLAMIDE  ER  500 mg Oral Q12H   amiodarone   200 mg Oral Daily   apixaban   5 mg Oral BID   arformoterol   15 mcg Nebulization BID   atorvastatin   20 mg Oral QPM   budesonide  (PULMICORT ) nebulizer solution  0.5 mg  Nebulization BID   buPROPion  ER  100 mg Oral BID   feeding supplement (GLUCERNA SHAKE)  237 mL Oral TID BM   furosemide   60 mg Intravenous Q8H   guaiFENesin   600 mg Oral BID   insulin  aspart  0-9 Units Subcutaneous Q4H   insulin  aspart  5 Units Subcutaneous TID WC   insulin  glargine-yfgn  13 Units Subcutaneous BID   levothyroxine   150 mcg Oral q AM   metoprolol  succinate  12.5 mg Oral Daily   midodrine   5 mg Oral TID WC   montelukast   10 mg Oral QHS   olopatadine   1 drop Both Eyes BID   revefenacin   175 mcg Nebulization Daily   rOPINIRole   0.5 mg Oral QHS   senna  1 tablet Oral BID   sodium chloride  flush  3 mL Intravenous Q12H   spironolactone   12.5 mg Oral Daily   Continuous Infusions:     LOS: 14 days    Time spent:    Deforest Fast, MD [facility]   02/11/2024, 9:30 AM

## 2024-02-11 NOTE — Progress Notes (Signed)
 NAME:  Chelsea [name], MRN:  [id number], DOB:  05-08-1966, LOS: 14 ADMISSION DATE:  01/28/2024, CONSULTATION DATE:  01/29/2024 REFERRING MD:  Lind Repine, MD, CHIEF COMPLAINT:  SOB   History of Present Illness:  58 y/o female with PMH for Morbid obesity, A fib, likely OHS with chronic respiratory failure on 5 lioters home O2, COPD, HTN, Hep C, DMT2 who presented with 4 days of worsening SOB.  No fever/chills.  She was placed on BiPAP in ED for PCO2 of 73 and her SOB.  The BiPAP seemed to help and she was also given Lasix  40mg , Rocephin  and Azithromycin .  Patient c/o LE and foot pain and normally takes Oxycodone  3x/day.    Pertinent  Medical History  orbid obesity, A fib, likely OHS with chronic respiratory failure on 5 lioters home O2, COPD, HTN, Hep C, DMT2  Significant Hospital Events: Including procedures, antibiotic start and stop dates in addition to other pertinent events   4/30: can be admitted to floor/stepdown 5/5: compliant with BiPAP 09/08/59%, RR 15, when sleeping and 15 L [place]ter when awake. Diuresed 3.6 L in the last 48 hrs 5/6: negative 0.5 L fluid balance in the last 24 hrs. On BiPAP 09/08/59%, RR 15, when sleeping and 15 L [place] when awake. Poor oral intake. ?I/S use. Started on IV steroids by primary team  5/7: negative 2.4 L fluid balance in the last 24 hrs. On HFNC 40L/58%, BiPAP PRN 09/07/49%, RR 15. Feels better and uses the I/S 5/8: negative 2.1 L fluid balance in the last 24 hrs. On HFNC 40L/60%, BiPAP PRN 09/07/49%, RR 15. Feels better and uses the I/S. She has itchy eyes. 5/9: negative 1.6 L fluid balance in the last 24 hrs. On HFNC 25L/60%, BiPAP PRN 09/07/54%, RR 15. Feels better and uses the I/S.  5/10: negative 3.4 L fluid balance in the last 24 hrs. On salter [place] 10 L, BiPAP PRN 09/07/54%, RR 15. Uses the I/S. 5/11: On salter [place] 9 L, BiPAP PRN 09/08/39%, RR 15. Uses the I/S 5/12: On salter [place] 7 L, BiPAP PRN 09/08/39%, RR 15. Uses the I/S. Negative 1.3 L fluid balance  in the last 24 hrs Interim History / Subjective:  No change overnight. Slept with bipap. No c/o.  Tol 5-7L [place]  Objective   Blood pressure 100/63, pulse 62, temperature 98.1 F (36.7 C), temperature source Oral, resp. rate 20, height 5\' 4"  (1.626 m), weight (!) 153.9 kg, SpO2 93%.    Vent Mode: BIPAP FiO2 (%):  [40 %-48 %] 40 % Set Rate:  [15 bmp] 15 bmp PEEP:  [8 cmH20] 8 cmH20   Intake/Output Summary (Last 24 hours) at 02/11/2024 1122 Last data filed at 02/11/2024 4098 Gross per 24 hour  Intake 957 ml  Output 2800 ml  Net -1843 ml   Filed Weights   02/09/24 0641 02/10/24 0428 02/11/24 0420  Weight: (!) 154.4 kg (!) 155 kg (!) 153.9 kg    Examination: Morbidly obese female, NAD in bed  HENT: mm moist, no JVD  Lungs: resps even non labored on [place], few scattered crackles  Cardiovascular: NL S1/S2. No m/g/r Abdomen: no distension or tenderness Extremities: no edema. Symmetrical. Chronic venous insufficiency  Neuro: nonfocal  Resolved Hospital Problem list   N/a  Assessment & Plan:  Acute on chronic hypercapnic and hypoxic respiratory failure, baseline 5 L [place], due to diastolic CHF exacerbation.  - s/p antibiotics ceftriaxone  #5, Zithromax  #3 (finished) - s/p steroids for one week - Sputum  culture: negative, PCT: low, Resp viral panel: negative  - Dietitian and speech evaluation: noted Acute on chronic HFpEF, moderate AS. EF 60% Morbid Obesity OSA/OHS COPD Atrial fibrillation HTN: controlled  Hep C DM-2 Anemia of chronic illness HypoK  - Continue Lasix , aldactone , Diamox  and fluid restriction - Monitor daily labs  - continue Budesonide , yupelri  and brovana  nebs - bipap PRN - I/S - Tracheostomy as recommended in prior consultations, but she declines  - PT/OT - Palliative care consult   PCCM will continue to follow   Lyndel Sanes, NP Pulmonary/Critical Care Medicine  02/11/2024  11:22 AM   See Tilford Foley for personal pager PCCM on call pager [phone]  until 7pm. Please call Elink 7p-7a. [phone]

## 2024-02-11 NOTE — Progress Notes (Signed)
 Nutrition Follow-up  DOCUMENTATION CODES:  Morbid obesity  INTERVENTION:  Continue carb modified diet as ordered Glucerna Shake po TID, each supplement provides 220 kcal and 10 grams of protein Discontinue ProSource Plus   NUTRITION DIAGNOSIS:  Increased nutrient needs related to acute illness as evidenced by estimated needs. - remains applicable  GOAL:  Patient will meet greater than or equal to 90% of their needs - progressing  MONITOR:   PO intake, Supplement acceptance, Labs, Weight trends, I & O's  REASON FOR ASSESSMENT:   Consult Assessment of nutrition requirement/status  ASSESSMENT:   Pt admitted with acute on chronic hypercapnic and hypoxemic respiratory failure. PMH significant for T2DM, obesity hypoventilation syndrome, hypertension, COPD and obesity.  Per MD, anticipating discharge to SNF soon.  O2 requirements improving.   Spoke with pt at bedside. She reports that her appetite is starting to improve.  Observed Ensure on bedside table. She reports that she has been consuming 3 per day. She does not like the ProSource supplements. Given pt's PO intake improving and consuming higher protein containing supplements, will discontinue prosource and monitor oral intake.   Meal completions: 5/7: 100% lunch 5/9: 90% breakfast 5/12: 100% breakfast, 100% lunch, 100% dinner 5/13: 100% breakfast, 100% lunch  First measured weight (5/2): 162.5 kg Current weight: 153.9 kg + edema: milt pitting generalized, moderate pitting BLE  UOP:  x24 hours -28.5L since admit   Medications: diamox , lasix  60mg  q8h, SSI 0-9 units q4h, SSI 5 units TID, semglee  13 units BID, klor con BID, senna BID, aldactone   Labs:  BUN 51 CBG's 145-266 x24 hours  NUTRITION - FOCUSED PHYSICAL EXAM: No depletions noted on nutrition focused physical exam. Body habitus likely masking any presence of muscle deficits.   Diet Order:   Diet Order             Diet Carb Modified Fluid  consistency: Thin; Room service appropriate? Yes with Assist; Fluid restriction: 1200 mL Fluid  Diet effective now                   EDUCATION NEEDS:   Education needs have been addressed  Skin:  Skin Assessment: Reviewed RN Assessment  Last BM:  5/12  Height:   Ht Readings from Last 1 Encounters:  01/28/24 5\' 4"  (1.626 m)    Weight:   Wt Readings from Last 1 Encounters:  02/11/24 (!) 153.9 kg    Ideal Body Weight:  54.5 kg  BMI:  Body mass index is 58.24 kg/m.  Estimated Nutritional Needs:   Kcal:  1700-1900  Protein:  95-110g  Fluid:  >/=1.7L  Rocklin Chute, RDN, LDN Clinical Nutrition See AMiON for contact information.

## 2024-02-11 NOTE — Progress Notes (Signed)
 Palliative:  HPI: 58 y.o. female with past medical history of DM2, morbid obesity, obesity hypoventilation syndrome, heart failure, chronic respiratory failure on 5L baseline and BiPAP at night, HTN, hep C, COPD, afib, and CHF admitted on 01/28/2024 with four days of worsening shortness of breath.   I met today with Chelsea [name]. I introduced myself from palliative care as she has been speaking with my colleague previously. We have a discussion of her goals of care today. Chelsea [name] shares that she is coming to the realization recently that she will not improve from her situation. She reports that she has no quality of life. She expresses significant concern of burden to her husband and the stress on him trying to be there for her in the midst of his own health issues as well as helping his mother who is blind. She is very concerned about Chelsea [name] and that he is taking care of himself and has someone to help lookout for him. She plans to have a conversation with her son who lives locally to help be there for Chelsea [name].   Chelsea [name] shares that she knows she wishes to be DNR but wants to discuss this with Chelsea [name] before officially putting this in place. She is having a lot of thoughts about what she is willing to go through to prolong her life knowing that her quality of life is not going to improve. She is unhappy in her facility but also shares she has been in a worse facility in the past. She reports that her sister would like her to move to a different facility in [place] closer to family. Chelsea [name] reports that she would like to be closer to her family as this would make her happy but she fears moving into a facility that is unknown and may be worse than her current situation.   Chelsea [name] shares much about her 12 grandchildren and 4 children. They are spread out in [place] and [place] with some locally. She knows that her family is busy with their own families, jobs, and school but she wishes she could see them more. She does stay  connected with them by phone. She reports that her grandchildren and seeing them grow up has been her motivation to continue living so far.   Chelsea [name] shares that Chelsea [name] does not have plans to return to the hospital until later tomorrow 5/14 ~4pm. I will plan to revisit at this time to discuss with them both DNR/DNI as well as further goals of care. I also discussed additional support from our chaplain and Chelsea [name] agrees that she would like a visit - spiritual care consulted.   All questions/concerns addressed. Emotional support provided.   Exam: Alert, oriented. Obese. No distress. Breathing regular, unlabored at rest. Abd soft. Generalized weakness - mostly bed-bound.   Plan: - Full code but wishes to discuss her desire for DNR with husband (plans to discuss together when he visits tomorrow)  50 min  Chelsea Grayer, NP Palliative Medicine Team Pager [phone] (Please see [email or web address] for schedule) Team Phone [phone]

## 2024-02-11 NOTE — Progress Notes (Signed)
 This chaplain responded to PMT NP-Michelle consult for Pt. spiritual care.  The Pt. is finishing a nap at the time of the visit. The chaplain understands the Pt. prefers an afternoon visit on Wednesday before meeting with Pt. significant other-Jerry.   This chaplain is available for F/U spiritual care as needed.  Chaplain Kathleene Papas [phone]

## 2024-02-12 DIAGNOSIS — I5033 Acute on chronic diastolic (congestive) heart failure: Secondary | ICD-10-CM | POA: Diagnosis not present

## 2024-02-12 DIAGNOSIS — E039 Hypothyroidism, unspecified: Secondary | ICD-10-CM | POA: Diagnosis not present

## 2024-02-12 DIAGNOSIS — E1169 Type 2 diabetes mellitus with other specified complication: Secondary | ICD-10-CM | POA: Diagnosis not present

## 2024-02-12 DIAGNOSIS — E66813 Obesity, class 3: Secondary | ICD-10-CM | POA: Diagnosis not present

## 2024-02-12 DIAGNOSIS — Z66 Do not resuscitate: Secondary | ICD-10-CM

## 2024-02-12 LAB — GLUCOSE, CAPILLARY
Glucose-Capillary: 152 mg/dL — ABNORMAL HIGH (ref 70–99)
Glucose-Capillary: 153 mg/dL — ABNORMAL HIGH (ref 70–99)
Glucose-Capillary: 248 mg/dL — ABNORMAL HIGH (ref 70–99)
Glucose-Capillary: 221 mg/dL — ABNORMAL HIGH (ref 70–99)
Glucose-Capillary: 257 mg/dL — ABNORMAL HIGH (ref 70–99)
Glucose-Capillary: 255 mg/dL — ABNORMAL HIGH (ref 70–99)
Glucose-Capillary: 191 mg/dL — ABNORMAL HIGH (ref 70–99)

## 2024-02-12 LAB — BASIC METABOLIC PANEL WITH GFR
Anion gap: 9 (ref 5–15)
BUN: 47 mg/dL — ABNORMAL HIGH (ref 6–20)
CO2: 33 mmol/L — ABNORMAL HIGH (ref 22–32)
Calcium: 8.2 mg/dL — ABNORMAL LOW (ref 8.9–10.3)
Chloride: 96 mmol/L — ABNORMAL LOW (ref 98–111)
Creatinine, Ser: 0.87 mg/dL (ref 0.44–1.00)
GFR, Estimated: >60 mL/min (ref >60)
Glucose, Bld: 154 mg/dL — ABNORMAL HIGH (ref 70–99)
Potassium: 3.7 mmol/L (ref 3.5–5.1)
Sodium: 138 mmol/L (ref 135–145)

## 2024-02-12 NOTE — Progress Notes (Signed)
 NAME:  Chelsea [name], MRN:  [id number], DOB:  1966-01-16, LOS: 15 ADMISSION DATE:  01/28/2024, CONSULTATION DATE:  01/29/2024 REFERRING MD:  Lind Repine, MD, CHIEF COMPLAINT:  SOB   History of Present Illness:  58 y/o female with PMH for Morbid obesity, A fib, likely OHS with chronic respiratory failure on 5 lioters home O2, COPD, HTN, Hep C, DMT2 who presented with 4 days of worsening SOB.  No fever/chills.  She was placed on BiPAP in ED for PCO2 of 73 and her SOB.  The BiPAP seemed to help and she was also given Lasix  40mg , Rocephin  and Azithromycin .  Patient c/o LE and foot pain and normally takes Oxycodone  3x/day.    Pertinent  Medical History  orbid obesity, A fib, likely OHS with chronic respiratory failure on 5 lioters home O2, COPD, HTN, Hep C, DMT2  Significant Hospital Events: Including procedures, antibiotic start and stop dates in addition to other pertinent events   4/30: can be admitted to floor/stepdown 5/5: compliant with BiPAP 09/08/59%, RR 15, when sleeping and 15 L [place] when awake. Diuresed 3.6 L in the last 48 hrs 5/6: negative 0.5 L fluid balance in the last 24 hrs. On BiPAP 09/08/59%, RR 15, when sleeping and 15 L [place] when awake. Poor oral intake. ?I/S use. Started on IV steroids by primary team  5/7: negative 2.4 L fluid balance in the last 24 hrs. On HFNC 40L/58%, BiPAP PRN 09/07/49%, RR 15. Feels better and uses the I/S 5/8: negative 2.1 L fluid balance in the last 24 hrs. On HFNC 40L/60%, BiPAP PRN 09/07/49%, RR 15. Feels better and uses the I/S. She has itchy eyes. 5/9: negative 1.6 L fluid balance in the last 24 hrs. On HFNC 25L/60%, BiPAP PRN 09/07/54%, RR 15. Feels better and uses the I/S.  5/10: negative 3.4 L fluid balance in the last 24 hrs. On salter [place] 10 L, BiPAP PRN 09/07/54%, RR 15. Uses the I/S. 5/11: On salter [place] 9 L, BiPAP PRN 09/08/39%, RR 15. Uses the I/S 5/12: On salter [place] 7 L, BiPAP PRN 09/08/39%, RR 15. Uses the I/S. Negative 1.3 L fluid balance  in the last 24 hrs 5/13: On salter [place] 7 L, BiPAP PRN 09/08/39%, RR 15. Uses the I/S. Negative 0.9 L fluid balance in the last 24 hrs  5/14: On salter [place] 6 L, BiPAP PRN 09/08/39%, RR 15. Uses the I/S. Negative 2.14 L fluid balance in the last 24 hrs  Interim History / Subjective:  Stable No CP, cough or wheezing No f/c/r Objective   Blood pressure (!) 92/50, pulse 62, temperature 98.4 F (36.9 C), temperature source Oral, resp. rate 20, height 5\' 4"  (1.626 m), weight (!) 152.7 kg, SpO2 (!) 88%.    Vent Mode: BIPAP FiO2 (%):  [40 %] 40 % Set Rate:  [15 bmp] 15 bmp PEEP:  [8 cmH20] 8 cmH20   Intake/Output Summary (Last 24 hours) at 02/12/2024 1311 Last data filed at 02/12/2024 1058 Gross per 24 hour  Intake 720 ml  Output 3700 ml  Net -2980 ml   Filed Weights   02/10/24 0428 02/11/24 0420 02/12/24 0225  Weight: (!) 155 kg (!) 153.9 kg (!) 152.7 kg    Examination: Morbidly obese General: alert, oriented x4, and comfortable. SpO2 95%  HENT: PERL, normal pharynx and oral mucosa. No LNE or thyromegaly. No JVD Lungs: symmetrical air entry bilaterally. Scattered crackles. No wheezing Cardiovascular: NL S1/S2. No m/g/r Abdomen: no distension or tenderness Extremities: no  edema. Symmetrical. Chronic venous insufficiency  Neuro: nonfocal  Resolved Hospital Problem list   N/a  Assessment & Plan:  Acute on chronic hypercapnic and hypoxic respiratory failure, HOT 5 L [place], due to diastolic CHF exacerbation.  - s/p antibiotics ceftriaxone  #5, Zithromax  #3 (finished) - s/p steroids for one week - Sputum culture: negative, PCT: low, Resp viral panel: negative  - Dietitian and speech evaluation: noted Acute on chronic HFpEF, moderate AS. EF 60% Morbid Obesity OSA/OHS COPD Atrial fibrillation HTN: controlled  Hep C DM-2 Anemia of chronic illness HypoK Itchy eyes  - Continue Lasix , aldactone , Diamox  and fluid restriction - Monitor daily labs  - Budesonide , yupelri  and brovana   nebs - Intermittently bipap dependent and wean O2 as tolerated - I/S - Tracheostomy as recommended in prior consultations, but she declined  - PT/OT - Palliative care consult   PCCM will continue to follow  Madelynn Schilder, MD [facility] Office: [phone]   See Amion for personal pager PCCM on call pager [phone] until 7pm. Please call Elink 7p-7a. [phone]

## 2024-02-12 NOTE — Progress Notes (Signed)
 Palliative:  HPI: 58 y.o. female with past medical history of DM2, morbid obesity, obesity hypoventilation syndrome, heart failure, chronic respiratory failure on 5L baseline and BiPAP at night, HTN, hep C, COPD, afib, and CHF admitted on 01/28/2024 with four days of worsening shortness of breath.   I met today at [facility] bedside along with husband, Chelsea [name]. We reviewed my conversation with [name] yesterday. We discussed Chelsea [name]'s overall poor prognosis and poor quality of life. We discussed desires for resuscitation - [name] confirms desire for no intubation. After discussion they both agree with DNR/DNI. We discussed overall goals to continue other care including rehospitalization as needed. [name] wants to live shares she also "wants something to live for." Her husband and grandchildren are motivation to continue other measures outside DNR. She also expresses desire to update HCPOA/Living Will so copy provided. Chelsea [name] was understanding and supportive of Chelsea [name]'s wishes. I discussed with him also self care.   All questions/concerns addressed. Emotional support provided. Updated Dr. Michaelene Admire.   Exam: Alert, oriented. Obese. No distress. Breathing regular, unlabored at rest. Abd soft. Generalized weakness - mostly bed-bound.   Plan: - DNR/DNI decided - No tracheostomy - Continue care otherwise to prolong life  35 min  Vila Grayer, NP Palliative Medicine Team Pager [phone] (Please see [email or web address] for schedule) Team Phone [phone]

## 2024-02-12 NOTE — Plan of Care (Signed)
  Problem: Metabolic: Goal: Ability to maintain appropriate glucose levels will improve Outcome: Progressing   Problem: Tissue Perfusion: Goal: Adequacy of tissue perfusion will improve Outcome: Progressing   Problem: Education: Goal: Knowledge of General Education information will improve Description: Including pain rating scale, medication(s)/side effects and non-pharmacologic comfort measures Outcome: Progressing   Problem: Clinical Measurements: Goal: Diagnostic test results will improve Outcome: Progressing   Problem: Clinical Measurements: Goal: Respiratory complications will improve Outcome: Progressing   Problem: Clinical Measurements: Goal: Cardiovascular complication will be avoided Outcome: Progressing   Problem: Activity: Goal: Risk for activity intolerance will decrease Outcome: Progressing   Problem: Pain Managment: Goal: General experience of comfort will improve and/or be controlled Outcome: Progressing   Problem: Safety: Goal: Ability to remain free from injury will improve Outcome: Progressing   Problem: Activity: Goal: Capacity to carry out activities will improve Outcome: Progressing

## 2024-02-12 NOTE — Plan of Care (Signed)

## 2024-02-12 NOTE — Progress Notes (Signed)
 PROGRESS NOTE    Chelsea [name]  ZOX:[id number] DOB: [birth year] DOA: 01/28/2024 PCP: Pcp, No  58 y/o female with PMH for Morbid obesity, bedbound SNF resident with morbid obesity, severe OSA/OHS with chronic respiratory failure on 5 liters home O2, diastolic CHF, COPD, HTN, Hep C, DMT2 who presented with 4 days of worsening SOB.  She was placed on BiPAP in ED for PCO2 of 73 and her SOB. The BiPAP seemed to help and she was also given Lasix  40mg , Rocephin  and Azithromycin . Patient c/o LE and foot pain and normally takes Oxycodone  3x/day.  - Prolonged hospitalization with significant hypoxia and respiratory failure - Slowly improving with diuresis> FiO2 needs now markedly better,  Subjective: No overnight events; no chest pain, no nausea, no vomiting.  Expressing dyspnea with minimal exertion and is still requiring oxygen  supplementation (overall improved).  Assessment and Plan:  Acute on chronic diastolic CHF -Echo with EF 60 to 65%, RV systolic function preserved, moderate valve aortic stenosis,  -5/04 CT chest with bilateral ground glass opacities, more dense infiltrates at the bases, Small bilateral pleural effusions.  -She is roughly 26 L negative, will continue IV lasix , daily weight, strict intake and output and follow renal function/electrolytes trend. -will continue Aldactone  and adjusted dose of toprol .  -will follow further rec's by Pulmonary service.  -continue to wean off oxygen  as tolerated.    Acute on chronic hypoxemic and hypercapnic respiratory failure. Obesity hypoventilation syndrome  Bilateral lower lobe pneumonia, more right than left. Possible aspiration.  - First 10-12 days, had very high FiO2 , up to 40 L high flow - Was oscillating  between BiPAP and heated high flow - Completed 5 days of antibiotics and has completed course of steroids as well. No wheezing on exam. -Continue Brovana , Pulmicort , Yupelri  -Pulmonary following, appreciate assistance and  rec's -Finally O2 needs slowly improving, continue to wean off as tolerated. - Palliative consulted as well, appreciate assistance and inputs. - Very poor prognosis, poor quality of life and limited functional status - Anticipate discharge to SNF once medically stable. -continue BIPAP QHS  Atrial flutter with rapid ventricular response (HCC) -rate controlled at the moment -continue telemetry monitoring -Continue Toprol , amiodarone , apixaban .  Hypokalemia -continue to follow electrolytes and replete as needed -associated with diuresis  Type 2 diabetes mellitus  -continue SSI and basal insulin  -modified carb diet discussed with patient.  Chronic iron  deficiency anemia -no overt bleeding -continue follow Hgb trend  Hypothyroidism -continue levothyroxine    Obesity, class 3 -Body mass index is 57.78 kg/m. -low calorie diet and portion controlled discussed with patient.  Anxiety -will continue tx with bupropion  -continue PRN alprazolam  as well.   Acute on chronic back pain -continue with as needed hydrocodone  -repositioning and muscle relaxant discussed with patient  DVT prophylaxis: apixaban  Code Status: Full Code Family Communication: None present Disposition Plan: returnning back to SNF at discharge  Consultants: PCCM and palliative care  Objective: Vitals:   02/12/24 0425 02/12/24 0711 02/12/24 0722 02/12/24 0826  BP: 115/67  (!) 95/59   Pulse: (!) 56 64  61  Resp: 20   19  Temp: 97.8 F (36.6 C)  97.7 F (36.5 C)   TempSrc: Oral  Axillary   SpO2: 96%   90%  Weight:      Height:        Intake/Output Summary (Last 24 hours) at 02/12/2024 1013 Last data filed at 02/12/2024 0624 Gross per 24 hour  Intake 720 ml  Output 2500 ml  Net -  1780 ml   Filed Weights   02/10/24 0428 02/11/24 0420 02/12/24 0225  Weight: (!) 155 kg (!) 153.9 kg (!) 152.7 kg    Examination: General exam: Alert, awake, oriented x 3; reports feeling ok and breathing easier. 5L [place]  in place with good saturation. Respiratory system: decreased BS at the bases, no wheezing; positive rhonchi appreciated..  Cardiovascular system:Rate controlled, positive SM, no rubs, no gallops. Gastrointestinal system: Abdomen is obese, nondistended, soft and nontender. No organomegaly or masses felt. Normal bowel sounds heard. Central nervous system: No new focal neurological deficits. Extremities: No cyanosis appreciated on exam, positive chronic stasis dermatitis changes and 1 + edema bilaterally appreciated. Skin: No petechiae. Psychiatry: Judgement and insight appear normal. Flat affect appreciated on exam.   Data Reviewed:   CBC: Recent Labs  Lab 02/06/24 0410 02/09/24 0314  WBC 7.6 8.6  HGB 8.3* 8.7*  HCT 28.3* 29.9*  MCV 91.6 91.2  PLT 314 335   Basic Metabolic Panel: Recent Labs  Lab 02/07/24 0941 02/08/24 0351 02/09/24 0314 02/10/24 0349 02/11/24 0437 02/12/24 0337  NA  --  138 138 139 137 138  K  --  3.4* 3.7 4.1 3.7 3.7  CL  --  87* 89* 93* 94* 96*  CO2  --  41* 41* 35* 35* 33*  GLUCOSE  --  140* 187* 128* 173* 154*  BUN  --  56* 49* 47* 51* 47*  CREATININE  --  0.87 0.83 0.74 0.98 0.87  CALCIUM   --  8.6* 8.9 8.9 8.5* 8.2*  MG 2.3  --   --   --   --   --    GFR: Estimated Creatinine Clearance: 101.9 mL/min (by C-G formula based on SCr of 0.87 mg/dL).  Liver Function Tests: Recent Labs  Lab 02/06/24 0410  AST 12*  ALT 9  ALKPHOS 73  BILITOT 0.6  PROT 7.1  ALBUMIN  2.8*   CBG: Recent Labs  Lab 02/11/24 1601 02/11/24 1938 02/11/24 2341 02/12/24 0410 02/12/24 0720  GLUCAP 241* 266* 211* 152* 153*   Urine analysis:    Component Value Date/Time   COLORURINE STRAW (A) 01/29/2024 1446   APPEARANCEUR CLEAR 01/29/2024 1446   LABSPEC 1.006 01/29/2024 1446   PHURINE 6.0 01/29/2024 1446   GLUCOSEU >=500 (A) 01/29/2024 1446   HGBUR NEGATIVE 01/29/2024 1446   BILIRUBINUR NEGATIVE 01/29/2024 1446   KETONESUR NEGATIVE 01/29/2024 1446    PROTEINUR NEGATIVE 01/29/2024 1446   UROBILINOGEN 0.2 12/08/2010 0545   NITRITE NEGATIVE 01/29/2024 1446   LEUKOCYTESUR NEGATIVE 01/29/2024 1446   Sepsis Labs:  Recent Results (from the past 240 hours)  Respiratory (~20 pathogens) panel by PCR     Status: None   Collection Time: 02/02/24  3:41 PM   Specimen: Nasopharyngeal Swab; Respiratory  Result Value Ref Range Status   Adenovirus NOT DETECTED NOT DETECTED Final   Coronavirus 229E NOT DETECTED NOT DETECTED Final    Comment: (NOTE) The Coronavirus on the Respiratory Panel, DOES NOT test for the novel  Coronavirus (2019 nCoV)    Coronavirus HKU1 NOT DETECTED NOT DETECTED Final   Coronavirus NL63 NOT DETECTED NOT DETECTED Final   Coronavirus OC43 NOT DETECTED NOT DETECTED Final   Metapneumovirus NOT DETECTED NOT DETECTED Final   Rhinovirus / Enterovirus NOT DETECTED NOT DETECTED Final   Influenza A NOT DETECTED NOT DETECTED Final   Influenza B NOT DETECTED NOT DETECTED Final   Parainfluenza Virus 1 NOT DETECTED NOT DETECTED Final   Parainfluenza Virus 2 NOT  DETECTED NOT DETECTED Final   Parainfluenza Virus 3 NOT DETECTED NOT DETECTED Final   Parainfluenza Virus 4 NOT DETECTED NOT DETECTED Final   Respiratory Syncytial Virus NOT DETECTED NOT DETECTED Final   Bordetella pertussis NOT DETECTED NOT DETECTED Final   Bordetella Parapertussis NOT DETECTED NOT DETECTED Final   Chlamydophila pneumoniae NOT DETECTED NOT DETECTED Final   Mycoplasma pneumoniae NOT DETECTED NOT DETECTED Final    Comment: Performed at [facility], [address] [place], [place] [address]     Radiology Studies: No results found.   Scheduled Meds:  acetaZOLAMIDE  ER  500 mg Oral Q12H   amiodarone   200 mg Oral Daily   apixaban   5 mg Oral BID   arformoterol   15 mcg Nebulization BID   atorvastatin   20 mg Oral QPM   budesonide  (PULMICORT ) nebulizer solution  0.5 mg Nebulization BID   buPROPion  ER  100 mg Oral BID   feeding supplement (GLUCERNA  SHAKE)  237 mL Oral TID BM   furosemide   60 mg Intravenous Q8H   guaiFENesin   600 mg Oral BID   insulin  aspart  0-9 Units Subcutaneous Q4H   insulin  aspart  5 Units Subcutaneous TID WC   insulin  glargine-yfgn  13 Units Subcutaneous BID   levothyroxine   150 mcg Oral q AM   metoprolol  succinate  12.5 mg Oral Daily   midodrine   5 mg Oral TID WC   montelukast   10 mg Oral QHS   olopatadine   1 drop Both Eyes BID   potassium chloride   20 mEq Oral BID   revefenacin   175 mcg Nebulization Daily   rOPINIRole   0.5 mg Oral QHS   senna  1 tablet Oral BID   sodium chloride  flush  3 mL Intravenous Q12H   spironolactone   12.5 mg Oral Daily   Continuous Infusions:     LOS: 15 days    Time spent: 40 min  Justina Oman MD [facility]   02/12/2024, 10:13 AM

## 2024-02-13 LAB — GLUCOSE, CAPILLARY
Glucose-Capillary: 172 mg/dL — ABNORMAL HIGH (ref 70–99)
Glucose-Capillary: 184 mg/dL — ABNORMAL HIGH (ref 70–99)
Glucose-Capillary: 210 mg/dL — ABNORMAL HIGH (ref 70–99)
Glucose-Capillary: 221 mg/dL — ABNORMAL HIGH (ref 70–99)
Glucose-Capillary: 192 mg/dL — ABNORMAL HIGH (ref 70–99)

## 2024-02-13 MED ORDER — TORSEMIDE 20 MG PO TABS
60.0000 mg | ORAL_TABLET | Freq: Two times a day (BID) | ORAL | Status: DC
  Administered 2024-02-13 – 2024-02-18 (×11): 60 mg via ORAL
  Filled 2024-02-13 (×11): qty 3

## 2024-02-13 MED ORDER — POTASSIUM CHLORIDE CRYS ER 20 MEQ PO TBCR
40.0000 meq | EXTENDED_RELEASE_TABLET | Freq: Once | ORAL | Status: AC
  Administered 2024-02-13: 40 meq via ORAL
  Filled 2024-02-13: qty 2

## 2024-02-13 NOTE — Progress Notes (Signed)
 NAME:  Chelsea [name], MRN:  [id number], DOB:  02/05/1966, LOS: 16 ADMISSION DATE:  01/28/2024, CONSULTATION DATE:  01/29/2024 REFERRING MD:  Chelsea Repine, MD, CHIEF COMPLAINT:  SOB   History of Present Illness:  58 y/o female with PMH for Morbid obesity, A fib, likely OHS with chronic respiratory failure on 5 lioters home O2, COPD, HTN, Hep C, DMT2 who presented with 4 days of worsening SOB.  No fever/chills.  She was placed on BiPAP in ED for PCO2 of 73 and her SOB.  The BiPAP seemed to help and she was also given Lasix  40mg , Rocephin  and Azithromycin .  Patient c/o LE and foot pain and normally takes Oxycodone  3x/day.    Pertinent  Medical History  orbid obesity, A fib, likely OHS with chronic respiratory failure on 5 lioters home O2, COPD, HTN, Hep C, DMT2  Significant Hospital Events: Including procedures, antibiotic start and stop dates in addition to other pertinent events   4/30: can be admitted to floor/stepdown 5/5: compliant with BiPAP 09/08/59%, RR 15, when sleeping and 15 L [place] when awake. Diuresed 3.6 L in the last 48 hrs 5/6: negative 0.5 L fluid balance in the last 24 hrs. On BiPAP 09/08/59%, RR 15, when sleeping and 15 L [place] when awake. Poor oral intake. ?I/S use. Started on IV steroids by primary team  5/7: negative 2.4 L fluid balance in the last 24 hrs. On HFNC 40L/58%, BiPAP PRN 09/07/49%, RR 15. Feels better and uses the I/S 5/8: negative 2.1 L fluid balance in the last 24 hrs. On HFNC 40L/60%, BiPAP PRN 09/07/49%, RR 15. Feels better and uses the I/S. She has itchy eyes. 5/9: negative 1.6 L fluid balance in the last 24 hrs. On HFNC 25L/60%, BiPAP PRN 09/07/54%, RR 15. Feels better and uses the I/S.  5/10: negative 3.4 L fluid balance in the last 24 hrs. On salter [place] 10 L, BiPAP PRN 09/07/54%, RR 15. Uses the I/S. 5/11: On salter [place] 9 L, BiPAP PRN 09/08/39%, RR 15. Uses the I/S 5/12: On salter [place] 7 L, BiPAP PRN 09/08/39%, RR 15. Uses the I/S. Negative 1.3 L fluid balance  in the last 24 hrs 5/13: On salter [place] 7 L, BiPAP PRN 09/08/39%, RR 15. Uses the I/S. Negative 0.9 L fluid balance in the last 24 hrs  5/14: On salter [place] 6 L, BiPAP PRN 09/08/39%, RR 15. Uses the I/S. Negative 2.14 L fluid balance in the last 24 hrs  5/15: On salter [place] 6 L, BiPAP PRN 09/08/39%, RR 15. Uses the I/S. Negative 3 L fluid balance in the last 24 hrs  Interim History / Subjective:  Stable No CP, cough or wheezing No f/c/r Objective   Blood pressure 103/69, pulse 65, temperature 97.8 F (36.6 C), temperature source Axillary, resp. rate 18, height 5\' 4"  (1.626 m), weight (!) 152.6 kg, SpO2 98%.        Intake/Output Summary (Last 24 hours) at 02/13/2024 0939 Last data filed at 02/13/2024 0865 Gross per 24 hour  Intake 960 ml  Output 4200 ml  Net -3240 ml   Filed Weights   02/11/24 0420 02/12/24 0225 02/13/24 0439  Weight: (!) 153.9 kg (!) 152.7 kg (!) 152.6 kg    Examination: Morbidly obese General: alert, oriented x4, and comfortable. SpO2 95%  HENT: PERL, normal pharynx and oral mucosa. No LNE or thyromegaly. No JVD Lungs: symmetrical air entry bilaterally. No crackles or wheezing Cardiovascular: NL S1/S2. No m/g/r Abdomen: no distension or tenderness  Extremities: no edema. Symmetrical. Chronic venous insufficiency  Neuro: nonfocal  Resolved Hospital Problem list   N/a  Assessment & Plan:  Acute on chronic hypercapnic and hypoxic respiratory failure, HOT 5 L [place], due to diastolic CHF exacerbation.  - s/p antibiotics ceftriaxone  #5, Zithromax  #3 (finished) - s/p steroids for one week - Sputum culture: negative, PCT: low, Resp viral panel: negative  - Dietitian and speech evaluation: noted Acute on chronic HFpEF, moderate AS. EF 60% Morbid Obesity OSA/OHS COPD Atrial fibrillation HTN: controlled  Hep C DM-2 Anemia of chronic illness HypoK Itchy eyes  - Continue Lasix , aldactone , Diamox  and fluid restriction - Monitor daily labs  - Budesonide , yupelri  and  brovana  nebs - Intermittently bipap dependent and wean O2 as tolerated - I/S - Tracheostomy as recommended in prior consultations, but she declined  - PT/OT - Palliative care consult   PCCM will sign off  Thank you  Chelsea Schilder, MD [facility] Office: [phone]   See Amion for personal pager PCCM on call pager [phone] until 7pm. Please call Elink 7p-7a. [phone]

## 2024-02-13 NOTE — Plan of Care (Signed)
  Problem: Fluid Volume: Goal: Ability to maintain a balanced intake and output will improve Outcome: Progressing   Problem: Health Behavior/Discharge Planning: Goal: Ability to manage health-related needs will improve Outcome: Progressing   Problem: Metabolic: Goal: Ability to maintain appropriate glucose levels will improve Outcome: Progressing   Problem: Education: Goal: Ability to demonstrate management of disease process will improve Outcome: Progressing   Problem: Activity: Goal: Capacity to carry out activities will improve Outcome: Progressing   Problem: Cardiac: Goal: Ability to achieve and maintain adequate cardiopulmonary perfusion will improve Outcome: Progressing

## 2024-02-13 NOTE — TOC Progression Note (Addendum)
 Transition of Care [facility]) - Progression Note    Patient Details  Name: Chelsea [name] MRN: [id number] Date of Birth: 1966-04-01  Transition of Care [facility]) CM/SW Contact  Valery Gaucher, [place] Phone Number: 02/13/2024, 11:22 AM  Clinical Narrative:      Patient is from [facility].   TOC will continue to follow and assist with discharge planning.  Liddie Reel, MSW, LCSW Clinical Social Worker    Expected Discharge Plan: Skilled Nursing Facility Barriers to Discharge: Continued Medical Work up  Expected Discharge Plan and Services In-house Referral: Clinical Social Work     Living arrangements for the past 2 months: Skilled Nursing Facility                                       Social Determinants of Health (SDOH) Interventions SDOH Screenings   Food Insecurity: No Food Insecurity (01/31/2024)  Housing: Low Risk  (01/31/2024)  Transportation Needs: No Transportation Needs (01/31/2024)  Utilities: Not At Risk (01/31/2024)  Alcohol Screen: Low Risk  (07/05/2022)  Financial Resource Strain: Low Risk  (07/05/2022)  Physical Activity: Insufficiently Active (03/27/2021)   Received from [facility], [facility]  Social Connections: Unknown (02/04/2022)   Received from [facility], [facility]  Stress: No Stress Concern Present (09/15/2021)   Received from [facility], [facility]  Tobacco Use: Low Risk  (01/28/2024)    Readmission Risk Interventions    11/15/2023    4:20 PM 07/23/2022    2:19 PM 04/09/2022    4:17 PM  Readmission Risk Prevention Plan  Transportation Screening Complete Complete Complete  PCP or Specialist Appt within 3-5 Days   Complete  HRI or Home Care Consult   Complete  Social Work Consult for Recovery Care Planning/Counseling   Complete  Palliative Care Screening   Not Applicable  Medication Review [employer]) Complete Complete Referral to Pharmacy  PCP or Specialist appointment within 3-5 days of discharge   Complete   HRI or Home Care Consult  Complete   SW Recovery Care/Counseling Consult  Complete   Palliative Care Screening Not Applicable Not Applicable   Skilled Nursing Facility Complete Complete

## 2024-02-13 NOTE — Progress Notes (Signed)
 Progress Note   Patient: Chelsea [name] ZOX:[id number] DOB: 11/22/1965 DOA: 01/28/2024     16 DOS: the patient was seen and examined on 02/13/2024   Brief hospital course: Mrs. Skwarek was admitted to the hospital with the working diagnosis of acute on chronic hypercapnic and hypoxemic respiratory failure.   58 yo female with the past medical history of T2DM, obesity hypoventilation syndrome, hypertension, COPD and obesity class 3 who presented with dyspnea.  Reported worsening symptoms for the last 4 days. Noted recently weaning off alprazolam .  On her initial physical examination her blood pressure was 127/57, HR 73, RR 18 and 02 saturation 85%  Lungs with no wheezing, positive bilateral rales, heart with S1 and S2 present and regular, abdomen with no distention and no lower extremity edema.   Chest radiograph with cardiomegaly with bilateral hilar vascular congestion, bilateral central interstitial infiltrates, with no effusions.   EKG 70 bpm, normal axis, normal intervals qtc 474, sinus rhythm with no significant ST segment changes, negative T wave V1 to V3.   Patient placed on high doses of diuretics for volume overload.  05/02 continue to have high 02 requirements per [place] high flow.  05/03 continue diuresing, slow wean of supplemental 02  05/04 worsening oxygenation, this morning on non re-breather and high flow nasal cannula despite diuresis. CT chest with signs of aspiration pneumonia, started on antibiotic therapy.  05/05 improved work of breathing. Continue diuresis.  05/06 patient uncomfortable on Bipap, and transitioned to heated  high flow nasal cannula.   Assessment and Plan: * Acute on chronic diastolic CHF (congestive heart failure) (HCC) Echocardiogram with preserved LV systolic function with EF 60 to 65%, RV systolic function preserved, moderate valve aortic stenosis, RVSP 26,6 mmHg, LA and RA not enlarged, no pericardial effusion.   Urine output 4.200 ml   Systolic blood pressure 100 mmHg range  05/04 CT chest with bilateral ground glass opacities, more dense infiltrates at the bases, more right than left lower lobe. Small bilateral pleural effusions.   Continue diuresis with furosemide  60 mg IV tid, spironolactone  and acetazolamide .  05/01 metolazone  2.5 mg 05/05 metolazone  2.5 mg  Continue metoprolol  succinate.  Possible transition to oral loop diuretic in the next 24 hrs   Will hold on SGLT 2 inh due to body habitus and risk of urinary tract infections.  Continue midodrine  for blood pressure support.   Continue to encourage airway clearing techniques with flutter valve and incentive spirometer.  She has not been out of bed due to rapid 02 desaturation   Acute on chronic hypoxemic and hypercapnic respiratory failure. Acute cardiogenic pulmonary edema.  Obesity hypoventilation syndrome  Bilateral lower lobe pneumonia, more right than left. Possible aspiration.   02 requirements have been improving, with 02 saturation today 98% on 7 L/min per [place]   Completed antibiotic therapy with ceftriaxone  and azithromycin  + systemic corticosteroids.  Bronchodilator therapy, inhaled corticosteroid. Revefenacin  ([place])   Continue respiratory support with intermittent bipap for respiratory support.   Atrial flutter with rapid ventricular response (HCC) Continue sinus rhythm in the 60's range bpm.  Continue rate control with metoprolol  and amiodarone  and anticoagulation with apixaban .  Continue telemetry monitoring.   Hypokalemia AKI  Renal function with serum cr at 0,87 with K at 3,7 and serum bicarbonate at 33 Na 138   Add 20 meq Kcl x 2 dose Continue diuresis, sequential nephron blockade with acetazolamide , furosemide , metolazone  and spironolactone .  Continue diuresis and follow up renal function and electrolytes.   Type  2 diabetes mellitus with hyperlipidemia (HCC) Continue glucose cover and monitoring with insulin  sliding  scale Continue basal insulin  10 units.  Her fasting glucose today is 153 mg/dl. Uncontrolled hyperglycemia.    Continue statin therapy.   Chronic iron  deficiency anemia Cell count has been stable   Hypothyroidism Continue levothyroxine    Obesity, class 3 Calculated BMI is 53.3   Anxiety Continue with as needed alprazolam .  Continue with bupropion .  Acute on chronic back pain, continue with as needed hydrocodone .        Subjective: Patient is feeling better, her dyspnea has improved but not back to baseline, back pain is better controlled, she has not been out of bed yet.   Physical Exam: Vitals:   02/13/24 0113 02/13/24 0313 02/13/24 0439 02/13/24 0748  BP:  100/60  103/69  Pulse: 65 63 60 65  Resp: (!) 24 20 18 18   Temp:  (!) 97 F (36.1 C)  97.8 F (36.6 C)  TempSrc:  Oral  Axillary  SpO2: 94% 97% 93% 98%  Weight:   (!) 152.6 kg   Height:       Neurology awake and alert ENT with mild pallor with no icterus Cardiovascular with S1 and S2 present and regular with positive systolic murmur at the base, no gallops Respiratory with no wheezing or rhonchi on anterior auscultation, positive rales at the dependent zones Abdomen with no distention Positive lower extremity edema, with component of lymphedema  Data Reviewed:    Family Communication: no family at the bedside   Disposition: Status is: Inpatient Remains inpatient appropriate because: recovering respiratory failure   Planned Discharge Destination: Skilled nursing facility     Author: Albertus Alt, MD 02/13/2024 11:47 AM  For on call review [email or web address].

## 2024-02-13 NOTE — Progress Notes (Addendum)
 This chaplain is present with the Pt. for F/U spiritual care and creating/updating the Pt. Advance Directive:  HCPOA and Living Will. This AD will supercede any previous documents.  The Pt. answered the chaplain's clarifying questions after receiving AD education. The Pt. chooses Tonja Fray as her healthcare agent. If this person is unwilling or unable to serve as healthcare agent the Pt. next choice is Berwyn Broker.  Notary services are unavailable at this time. The chaplain will revisit the Pt. The chaplain returned the incomplete AD to the Pt.   **1233 This chaplain is waiting for witnesses to be available for notarizing of the Pt. Advance Directive.  **1405 This chaplain is present with the Pt., notary, and witnesses for the notarizing of the Pt. AD:  HCPOA and LW. The chaplain gave the Pt. the original AD along with two copies. The chaplain scanned the Pt. AD into the Pt. EMR.   This chaplain is available for F/U spiritual care as needed.  Chaplain Kathleene Papas [phone]

## 2024-02-14 LAB — BASIC METABOLIC PANEL WITH GFR
Anion gap: 8 (ref 5–15)
BUN: 46 mg/dL — ABNORMAL HIGH (ref 6–20)
CO2: 33 mmol/L — ABNORMAL HIGH (ref 22–32)
Calcium: 8 mg/dL — ABNORMAL LOW (ref 8.9–10.3)
Chloride: 98 mmol/L (ref 98–111)
Creatinine, Ser: 1.01 mg/dL — ABNORMAL HIGH (ref 0.44–1.00)
GFR, Estimated: >60 mL/min (ref >60)
Glucose, Bld: 232 mg/dL — ABNORMAL HIGH (ref 70–99)
Potassium: 3.7 mmol/L (ref 3.5–5.1)
Sodium: 139 mmol/L (ref 135–145)

## 2024-02-14 LAB — GLUCOSE, CAPILLARY
Glucose-Capillary: 232 mg/dL — ABNORMAL HIGH (ref 70–99)
Glucose-Capillary: 181 mg/dL — ABNORMAL HIGH (ref 70–99)
Glucose-Capillary: 175 mg/dL — ABNORMAL HIGH (ref 70–99)
Glucose-Capillary: 277 mg/dL — ABNORMAL HIGH (ref 70–99)

## 2024-02-14 LAB — MAGNESIUM: Magnesium: 2.4 mg/dL (ref 1.7–2.4)

## 2024-02-14 MED ORDER — SPIRONOLACTONE 25 MG PO TABS
25.0000 mg | ORAL_TABLET | Freq: Every day | ORAL | Status: DC
  Administered 2024-02-15 – 2024-02-18 (×4): 25 mg via ORAL
  Filled 2024-02-14 (×4): qty 1

## 2024-02-14 MED ORDER — INSULIN ASPART 100 UNIT/ML IJ SOLN
0.0000 [IU] | Freq: Three times a day (TID) | INTRAMUSCULAR | Status: DC
Start: 2024-02-15 — End: 2024-02-16
  Administered 2024-02-15: 2 [IU] via SUBCUTANEOUS
  Administered 2024-02-15: 3 [IU] via SUBCUTANEOUS
  Administered 2024-02-15 – 2024-02-16 (×2): 2 [IU] via SUBCUTANEOUS
  Administered 2024-02-16: 3 [IU] via SUBCUTANEOUS

## 2024-02-14 MED ORDER — GUAIFENESIN-DM 100-10 MG/5ML PO SYRP: 5.0000 mL | ORAL_SOLUTION | ORAL | Status: DC | PRN

## 2024-02-14 MED ORDER — SALINE SPRAY 0.65 % NA SOLN
1.0000 | NASAL | Status: DC | PRN
  Administered 2024-02-18: 1 via NASAL
  Filled 2024-02-14: qty 44

## 2024-02-14 MED ORDER — INSULIN ASPART 100 UNIT/ML IJ SOLN
0.0000 [IU] | Freq: Every day | INTRAMUSCULAR | Status: DC
  Administered 2024-02-14 – 2024-02-15 (×2): 3 [IU] via SUBCUTANEOUS
  Administered 2024-02-16: 2 [IU] via SUBCUTANEOUS

## 2024-02-14 MED ORDER — POTASSIUM CHLORIDE CRYS ER 20 MEQ PO TBCR
40.0000 meq | EXTENDED_RELEASE_TABLET | Freq: Once | ORAL | Status: AC
  Administered 2024-02-14: 40 meq via ORAL
  Filled 2024-02-14: qty 2

## 2024-02-14 NOTE — Progress Notes (Addendum)
 Progress Note   Patient: Chelsea [name] ZOX:[id number] DOB: [birth year] DOA: 01/28/2024     17 DOS: the patient was seen and examined on 02/14/2024   Brief hospital course: Mrs. Crowell was admitted to the hospital with the working diagnosis of acute on chronic hypercapnic and hypoxemic respiratory failure.   58 yo female with the past medical history of T2DM, obesity hypoventilation syndrome, hypertension, COPD and obesity class 3 who presented with dyspnea.  Reported worsening symptoms for the last 4 days. Noted recently weaning off alprazolam .  On her initial physical examination her blood pressure was 127/57, HR 73, RR 18 and 02 saturation 85%  Lungs with no wheezing, positive bilateral rales, heart with S1 and S2 present and regular, abdomen with no distention and no lower extremity edema.   Chest radiograph with cardiomegaly with bilateral hilar vascular congestion, bilateral central interstitial infiltrates, with no effusions.   EKG 70 bpm, normal axis, normal intervals qtc 474, sinus rhythm with no significant ST segment changes, negative T wave V1 to V3.   Patient placed on high doses of diuretics for volume overload.  05/02 continue to have high 02 requirements per [place] high flow.  05/03 continue diuresing, slow wean of supplemental 02  05/04 worsening oxygenation, this morning on non re-breather and high flow nasal cannula despite diuresis. CT chest with signs of aspiration pneumonia, started on antibiotic therapy.  05/05 improved work of breathing. Continue diuresis.  05/06 patient uncomfortable on Bipap, and transitioned to heated  high flow nasal cannula.  05/16 improved oxygenation, today using 5 L.min per [place]. She has been transitioned to oral loop diuretic.    Assessment and Plan: * Acute on chronic diastolic CHF (congestive heart failure) (HCC) Echocardiogram with preserved LV systolic function with EF 60 to 65%, RV systolic function preserved, moderate valve aortic  stenosis, RVSP 26,6 mmHg, LA and RA not enlarged, no pericardial effusion.   05/04 CT chest with bilateral ground glass opacities, more dense infiltrates at the bases, more right than left lower lobe. Small bilateral pleural effusions.   Urine output 2,100 ml  Systolic blood pressure 100 mmHg range   05/01 metolazone  2.5 mg 05/05 metolazone  2.5 mg  Continue with spironolactone  (increase to 25 mg) and acetazolamide .  Continue torsemide  60 mg po bid.  Continue metoprolol  succinate.   Holding on SGLT 2 inh due to body habitus and risk of urinary tract infections.  Continue midodrine  for blood pressure support.   Continue to encourage airway clearing techniques with flutter valve and incentive spirometer.  She has not been out of bed due to rapid 02 desaturation   Acute on chronic hypoxemic and hypercapnic respiratory failure. Acute cardiogenic pulmonary edema.  Obesity hypoventilation syndrome  Bilateral lower lobe pneumonia, more right than left. Possible aspiration.   Completed antibiotic therapy with ceftriaxone  and azithromycin  + systemic corticosteroids.   Her 02 requirements continue to improve and today she is on 5 L/min per [place] with 02 saturation 96%   Continue with Bronchodilator therapy, inhaled corticosteroid. Revefenacin  [name])   Continue respiratory support with intermittent bipap for respiratory support.   Atrial flutter with rapid ventricular response (HCC) Continue sinus rhythm in the 60's range bpm.  Continue rate control with metoprolol  and amiodarone   Anticoagulation with apixaban .  Continue telemetry monitoring.   Hypokalemia AKI  Stable renal function with serum cr at 1,0 with K at 3,7 and serum bicarbonate at 33  Na 139 and Mg 2.4   Continue diuresis, sequential nephron blockade with acetazolamide ,  furosemide  and spironolactone .  Add 40 meq Kcl to prevent hypokalemia.  Continue diuresis and follow up renal function and electrolytes.   Type 2  diabetes mellitus with hyperlipidemia (HCC) Continue glucose cover and monitoring with insulin  sliding scale Continue basal insulin  10 units.  Her fasting glucose today is 232 mg/dl. Uncontrolled hyperglycemia.    Continue statin therapy.   Chronic iron  deficiency anemia Cell count has been stable   Hypothyroidism Continue levothyroxine    Obesity, class 3 Calculated BMI is 53.3   Anxiety Continue with as needed alprazolam .  Continue with bupropion .  Acute on chronic back pain, continue with as needed hydrocodone .        Subjective: Patient is feeling better, dyspnea continue to improve and back pain is controlled with analgesics.  She has not been ambulatory even before admission   Physical Exam: Vitals:   02/14/24 0440 02/14/24 0908 02/14/24 0924 02/14/24 0932  BP:   109/77   Pulse: (!) 57 62 64 75  Resp: 20 17 (!) 21   Temp:   98.4 F (36.9 C)   TempSrc:   Axillary   SpO2: 92% 91% 96%   Weight: (!) 152.6 kg     Height:       Neurology awake and alert ENT with mild pallor Cardiovascular with S1 and S2 present and regular with no gallops or rubs, positive systolic murmur at the base No JVD No ankle edema, positive lymphedema and chronic skin changes Respiratory with no rales or wheezing, no rhonchi on anterior auscultation  Abdomen protuberant with no distention  Data Reviewed:    Family Communication: no family at the bedside   Disposition: Status is: Inpatient Remains inpatient appropriate because: recovering from respiratory failure   Planned Discharge Destination: Skilled nursing facility    Author: Albertus Alt, MD 02/14/2024 11:37 AM  For on call review [email or web address].

## 2024-02-14 NOTE — Plan of Care (Signed)
  Problem: Education: Goal: Ability to describe self-care measures that may prevent or decrease complications (Diabetes Survival Skills Education) will improve Outcome: Progressing   Problem: Coping: Goal: Ability to adjust to condition or change in health will improve Outcome: Progressing

## 2024-02-14 NOTE — Progress Notes (Signed)
   02/14/24 0128  BiPAP/CPAP/SIPAP  $ Non-Invasive Ventilator  Non-Invasive Vent Subsequent  BiPAP/CPAP/SIPAP Pt Type Adult  BiPAP/CPAP/SIPAP SERVO  Mask Type Full face mask  Mask Size Large  Set Rate 15 breaths/min  Respiratory Rate 22 breaths/min  IPAP 12 cmH20  EPAP 8 cmH2O  FiO2 (%) 40 %  Minute Ventilation 11.7  Leak 17  Peak Inspiratory Pressure (PIP) 15  Tidal Volume (Vt) 542  Patient Home Machine No  Patient Home Mask No  Patient Home Tubing No  Auto Titrate No  Device Plugged into RED Power Outlet Yes  BiPAP/CPAP /SiPAP Vitals  Pulse Rate 70  SpO2 96 %  Bilateral Breath Sounds Clear;Diminished  MEWS Score/Color  MEWS Score 0  MEWS Score Color Green

## 2024-02-14 NOTE — Inpatient Diabetes Management (Signed)
 Inpatient Diabetes Program Recommendations  AACE/ADA: New Consensus Statement on Inpatient Glycemic Control (2015)  Target Ranges:  Prepandial:   less than 140 mg/dL      Peak postprandial:   less than 180 mg/dL (1-2 hours)      Critically ill patients:  140 - 180 mg/dL   Lab Results  Component Value Date   GLUCAP 232 (H) 02/14/2024   HGBA1C 8.1 (H) 01/28/2024    Review of Glycemic Control  Latest Reference Range & Units 02/13/24 08:05 02/13/24 12:11 02/13/24 16:25 02/13/24 21:05 02/14/24 06:30  Glucose-Capillary 70 - 99 mg/dL 161 (H) 096 (H) 045 (H) 192 (H) 232 (H)   Diabetes history: DM 2 Outpatient Diabetes medications: Jardiance  25 mg Daily, Lantus  20 units Daily, Mounjaro 5 mg Weekly on Wednesdays Current orders for Inpatient glycemic control:  Novolog  5 units tid meal coverage Semglee  13 units bid Novolog  0-9 units Q4 hours  Glucerna tid between meals  Inpatient Diabetes Program Recommendations:    -   change Novolog  Correction scale to tid and add Novolog  hs scale -   Increase Novolog  meal coverage to 7 units tid if eating >50% of meals  Thanks,  Eloise Hake RN, MSN, BC-ADM Inpatient Diabetes Coordinator Team Pager [phone] (8a-5p)

## 2024-02-15 LAB — BASIC METABOLIC PANEL WITH GFR
Anion gap: 10 (ref 5–15)
BUN: 47 mg/dL — ABNORMAL HIGH (ref 6–20)
CO2: 31 mmol/L (ref 22–32)
Calcium: 7.9 mg/dL — ABNORMAL LOW (ref 8.9–10.3)
Chloride: 96 mmol/L — ABNORMAL LOW (ref 98–111)
Creatinine, Ser: 0.82 mg/dL (ref 0.44–1.00)
GFR, Estimated: >60 mL/min (ref >60)
Glucose, Bld: 220 mg/dL — ABNORMAL HIGH (ref 70–99)
Potassium: 3.7 mmol/L (ref 3.5–5.1)
Sodium: 137 mmol/L (ref 135–145)

## 2024-02-15 LAB — MAGNESIUM: Magnesium: 2.4 mg/dL (ref 1.7–2.4)

## 2024-02-15 LAB — GLUCOSE, CAPILLARY
Glucose-Capillary: 193 mg/dL — ABNORMAL HIGH (ref 70–99)
Glucose-Capillary: 231 mg/dL — ABNORMAL HIGH (ref 70–99)
Glucose-Capillary: 171 mg/dL — ABNORMAL HIGH (ref 70–99)
Glucose-Capillary: 277 mg/dL — ABNORMAL HIGH (ref 70–99)

## 2024-02-15 MED ORDER — POTASSIUM CHLORIDE CRYS ER 20 MEQ PO TBCR
40.0000 meq | EXTENDED_RELEASE_TABLET | Freq: Once | ORAL | Status: AC
  Administered 2024-02-15: 40 meq via ORAL
  Filled 2024-02-15: qty 2

## 2024-02-15 MED ORDER — INSULIN GLARGINE-YFGN 100 UNIT/ML ~~LOC~~ SOLN
15.0000 [IU] | Freq: Two times a day (BID) | SUBCUTANEOUS | Status: DC
  Administered 2024-02-15 – 2024-02-18 (×6): 15 [IU] via SUBCUTANEOUS
  Filled 2024-02-15 (×7): qty 0.15

## 2024-02-15 NOTE — Progress Notes (Signed)
 Progress Note   Patient: Chelsea [name] ZOX:[id number] DOB: [birth year] DOA: 01/28/2024     18 DOS: the patient was seen and examined on 02/15/2024   Brief hospital course: Mrs. Sinkler was admitted to the hospital with the working diagnosis of acute on chronic hypercapnic and hypoxemic respiratory failure.   58 yo female with the past medical history of T2DM, obesity hypoventilation syndrome, hypertension, COPD and obesity class 3 who presented with dyspnea.  Reported worsening symptoms for the last 4 days. Noted recently weaning off alprazolam .  On her initial physical examination her blood pressure was 127/57, HR 73, RR 18 and 02 saturation 85%  Lungs with no wheezing, positive bilateral rales, heart with S1 and S2 present and regular, abdomen with no distention and no lower extremity edema.   Chest radiograph with cardiomegaly with bilateral hilar vascular congestion, bilateral central interstitial infiltrates, with no effusions.   EKG 70 bpm, normal axis, normal intervals qtc 474, sinus rhythm with no significant ST segment changes, negative T wave V1 to V3.   Patient placed on high doses of diuretics for volume overload.  05/02 continue to have high 02 requirements per [place] high flow.  05/03 continue diuresing, slow wean of supplemental 02  05/04 worsening oxygenation, this morning on non re-breather and high flow nasal cannula despite diuresis. CT chest with signs of aspiration pneumonia, started on antibiotic therapy.  05/05 improved work of breathing. Continue diuresis.  05/06 patient uncomfortable on Bipap, and transitioned to heated  high flow nasal cannula.  05/16 improved oxygenation, today using 5 L.min per [place]. She has been transitioned to oral loop diuretic.    Assessment and Plan: * Acute on chronic diastolic CHF (congestive heart failure) (HCC) Echocardiogram with preserved LV systolic function with EF 60 to 65%, RV systolic function preserved, moderate valve aortic  stenosis, RVSP 26,6 mmHg, LA and RA not enlarged, no pericardial effusion.   05/04 CT chest with bilateral ground glass opacities, more dense infiltrates at the bases, more right than left lower lobe. Small bilateral pleural effusions.   Urine output 2,950 ml  Systolic blood pressure 100 mmHg range   05/01 metolazone  2.5 mg 05/05 metolazone  2.5 mg  Continue with spironolactone   and acetazolamide .  Continue torsemide  60 mg po bid.  Continue metoprolol  succinate.   Holding on SGLT 2 inh due to body habitus and risk of urinary tract infections.  Continue midodrine  for blood pressure support.   Continue to encourage airway clearing techniques with flutter valve and incentive spirometer.   Acute on chronic hypoxemic and hypercapnic respiratory failure. Acute cardiogenic pulmonary edema.  Obesity hypoventilation syndrome  Bilateral lower lobe pneumonia, more right than left. Possible aspiration.  Completed antibiotic therapy with ceftriaxone  and azithromycin  + systemic corticosteroids.   Her 02 requirements continue to improve and today she is on 5 to 8 L/min per [place] with 02 saturation 98% Target 02 saturation 92% or grater.    Continue with Bronchodilator therapy, inhaled corticosteroid. Revefenacin  ([place])   Continue respiratory support with intermittent bipap for respiratory support.   Atrial flutter with rapid ventricular response (HCC) Continue sinus rhythm in the 60's range bpm.  Continue rate control with metoprolol  and amiodarone   Anticoagulation with apixaban .  Continue telemetry monitoring.   Hypokalemia AKI  Renal function with serum cr at 0,82 with K at 3,7 and serum bicarbonate at 31 Na 137 and Mg 2.4   Continue diuresis, sequential nephron blockade with acetazolamide , furosemide  and spironolactone .  Add 40 meq Kcl to prevent  hypokalemia.  Continue diuresis and follow up renal function and electrolytes.   Type 2 diabetes mellitus with hyperlipidemia  (HCC) Continue glucose cover and monitoring with insulin  sliding scale increase basal insulin  to 15 units bid Continue pre meal insulin  5 units.  Her fasting glucose today is 220 mg/dl. Uncontrolled hyperglycemia.    Continue statin therapy.   Chronic iron  deficiency anemia Cell count has been stable   Hypothyroidism Continue levothyroxine    Obesity, class 3 Calculated BMI is 53.3   Anxiety Continue with as needed alprazolam .  Continue with bupropion .  Acute on chronic back pain, continue with as needed hydrocodone .         Subjective: Patient with improvement in dyspnea. She has not been out of the bed. Tolerating po well, back pain is controlled   Physical Exam: Vitals:   02/15/24 0418 02/15/24 0805 02/15/24 0850 02/15/24 0916  BP:  104/70  104/70  Pulse:  63  63  Resp:  16    Temp:  98.3 F (36.8 C)    TempSrc:  Oral    SpO2:  (!) 88% 98%   Weight: (!) 155.8 kg     Height:       Neurology awake and alert ENT with mild pallor Cardiovascular with S1 and S2 present and regular, positive systolic murmur at the base Respiratory with no rales and no wheezing, on anterior auscultation  Abdomen with no distention  No lower extremity edema at the ankles, positive lymphedema  Data Reviewed:    Family Communication: no family at the bedside   Disposition: Status is: Inpatient Remains inpatient appropriate because: pending transfer to SNF   Planned Discharge Destination: Skilled nursing facility   Author: Albertus Alt, MD 02/15/2024 10:32 AM  For on call review [email or web address].

## 2024-02-15 NOTE — Progress Notes (Signed)
 Late entry. Clarification of I.V.  site removal . Was told in report by Christell Cove. that I.V.had clotted off and that patient refused to have new I.V. restart. Adell Hones did notified M.D. on call that day and he was made aware I was ask by RN to remove it for her.. I did  remove I.V. after I attempted to flush it. And It would not flush. Removed I.V. site unremark. Patient still refused to have I.V. restarted.

## 2024-02-15 NOTE — Progress Notes (Signed)
 O2 sats down in low 80's-upper 80's. Changed finger probe. Enc CDB and IS. Comes up when awaken and talking to 91%. When asleep drops. Again. Enc. To use BiPAP. Patient is refusing. Stated, "It is too Early"

## 2024-02-16 LAB — BASIC METABOLIC PANEL WITH GFR
Anion gap: 9 (ref 5–15)
BUN: 45 mg/dL — ABNORMAL HIGH (ref 6–20)
CO2: 36 mmol/L — ABNORMAL HIGH (ref 22–32)
Calcium: 8.3 mg/dL — ABNORMAL LOW (ref 8.9–10.3)
Chloride: 93 mmol/L — ABNORMAL LOW (ref 98–111)
Creatinine, Ser: 0.89 mg/dL (ref 0.44–1.00)
GFR, Estimated: >60 mL/min (ref >60)
Glucose, Bld: 206 mg/dL — ABNORMAL HIGH (ref 70–99)
Potassium: 3.9 mmol/L (ref 3.5–5.1)
Sodium: 138 mmol/L (ref 135–145)

## 2024-02-16 LAB — GLUCOSE, CAPILLARY
Glucose-Capillary: 194 mg/dL — ABNORMAL HIGH (ref 70–99)
Glucose-Capillary: 215 mg/dL — ABNORMAL HIGH (ref 70–99)
Glucose-Capillary: 232 mg/dL — ABNORMAL HIGH (ref 70–99)
Glucose-Capillary: 239 mg/dL — ABNORMAL HIGH (ref 70–99)

## 2024-02-16 MED ORDER — POTASSIUM CHLORIDE CRYS ER 20 MEQ PO TBCR
20.0000 meq | EXTENDED_RELEASE_TABLET | Freq: Every day | ORAL | Status: DC
  Administered 2024-02-16 – 2024-02-18 (×3): 20 meq via ORAL
  Filled 2024-02-16: qty 2
  Filled 2024-02-16 (×2): qty 1

## 2024-02-16 MED ORDER — BUDESON-GLYCOPYRROL-FORMOTEROL 160-9-4.8 MCG/ACT IN AERO
2.0000 | INHALATION_SPRAY | Freq: Two times a day (BID) | RESPIRATORY_TRACT | Status: DC
  Administered 2024-02-17 – 2024-02-18 (×3): 2 via RESPIRATORY_TRACT
  Filled 2024-02-16: qty 5.9

## 2024-02-16 MED ORDER — INSULIN ASPART 100 UNIT/ML IJ SOLN
0.0000 [IU] | Freq: Three times a day (TID) | INTRAMUSCULAR | Status: DC
  Administered 2024-02-16 – 2024-02-17 (×4): 5 [IU] via SUBCUTANEOUS
  Administered 2024-02-18: 3 [IU] via SUBCUTANEOUS
  Administered 2024-02-18: 5 [IU] via SUBCUTANEOUS
  Administered 2024-02-18: 2 [IU] via SUBCUTANEOUS

## 2024-02-16 NOTE — Progress Notes (Signed)
 Progress Note   Patient: Chelsea [name] ZOX:[id number] DOB: [birth year] DOA: 01/28/2024     19 DOS: the patient was seen and examined on 02/16/2024   Brief hospital course: Mrs. Menon was admitted to the hospital with the working diagnosis of acute on chronic hypercapnic and hypoxemic respiratory failure.   58 yo female with the past medical history of T2DM, obesity hypoventilation syndrome, hypertension, COPD and obesity class 3 who presented with dyspnea.  Reported worsening symptoms for the last 4 days. Noted recently weaning off alprazolam .  On her initial physical examination her blood pressure was 127/57, HR 73, RR 18 and 02 saturation 85%  Lungs with no wheezing, positive bilateral rales, heart with S1 and S2 present and regular, abdomen with no distention and no lower extremity edema.   Chest radiograph with cardiomegaly with bilateral hilar vascular congestion, bilateral central interstitial infiltrates, with no effusions.   EKG 70 bpm, normal axis, normal intervals qtc 474, sinus rhythm with no significant ST segment changes, negative T wave V1 to V3.   Patient placed on high doses of diuretics for volume overload.  05/02 continue to have high 02 requirements per [place] high flow.  05/03 continue diuresing, slow wean of supplemental 02  05/04 worsening oxygenation, this morning on non re-breather and high flow nasal cannula despite diuresis. CT chest with signs of aspiration pneumonia, started on antibiotic therapy.  05/05 improved work of breathing. Continue diuresis.  05/06 patient uncomfortable on Bipap, and transitioned to heated  high flow nasal cannula.  05/16 improved oxygenation, today using 5 L.min per [place]. She has been transitioned to oral loop diuretic.  05/18 stable oxygenation, possible transfer to SNF on Tuesday.    Assessment and Plan: * Acute on chronic diastolic CHF (congestive heart failure) (HCC) Echocardiogram with preserved LV systolic function with EF  60 to 65%, RV systolic function preserved, moderate valve aortic stenosis, RVSP 26,6 mmHg, LA and RA not enlarged, no pericardial effusion.   05/04 CT chest with bilateral ground glass opacities, more dense infiltrates at the bases, more right than left lower lobe. Small bilateral pleural effusions.   Urine output 1800  ml  Systolic blood pressure 100 mmHg range   05/01 metolazone  2.5 mg 05/05 metolazone  2.5 mg  Continue with spironolactone   and acetazolamide .  Continue torsemide  60 mg po bid.  Continue metoprolol  succinate.   Holding on SGLT 2 inh due to body habitus and risk of urinary tract infections.  Continue midodrine  for blood pressure support.   Continue to encourage airway clearing techniques with flutter valve and incentive spirometer.   Acute on chronic hypoxemic and hypercapnic respiratory failure. Acute cardiogenic pulmonary edema.  Obesity hypoventilation syndrome  Bilateral lower lobe pneumonia, more right than left. Possible aspiration.  Completed antibiotic therapy with ceftriaxone  and azithromycin  + systemic corticosteroids.   Her 02 requirements continue to improve and today she is on 5 to 7 L/min per [place] with 02 saturation 98% Target 02 saturation 92% or grater.    Continue with Bronchodilator therapy, inhaled corticosteroid. Revefenacin  [name])   Continue respiratory support with intermittent bipap for respiratory support.   Atrial flutter with rapid ventricular response (HCC) Continue sinus rhythm in the 60's range bpm.  Continue rate control with metoprolol  and amiodarone   Anticoagulation with apixaban .  Continue telemetry monitoring.   Hypokalemia AKI  Stable renal function and electrolytes with serum cr at 0,89 with K at 3,9 and serum bicarbonate at 36  Na 138  Continue diuresis, sequential nephron blockade with  acetazolamide , furosemide  and spironolactone .  Add 20 meq Kcl daily to prevent hypokalemia.   Type 2 diabetes mellitus with  hyperlipidemia (HCC) Continue glucose cover and monitoring with insulin  sliding scale increase basal insulin  to 15 units bid Continue pre meal insulin  5 units.  Her fasting glucose today is 206 mg/dl. Uncontrolled hyperglycemia, will increase dose of sliding scale.   Continue statin therapy.   Chronic iron  deficiency anemia Cell count has been stable   Hypothyroidism Continue levothyroxine    Obesity, class 3 Calculated BMI is 53.3   Anxiety Continue with as needed alprazolam .  Continue with bupropion .  Acute on chronic back pain, continue with as needed hydrocodone .          Subjective: Patient is feeling well, no chest pain, dyspnea continue to improve and her back pain is better controlled.   Physical Exam: Vitals:   02/16/24 0331 02/16/24 0700 02/16/24 0840 02/16/24 0841  BP: 108/75 96/67    Pulse: (!) 59 64  65  Resp: 20 19  20   Temp: 98 F (36.7 C) 97.9 F (36.6 C)    TempSrc: Oral Oral    SpO2: 94% 100% (!) 89% 91%  Weight: (!) 154.2 kg     Height:       Neurology awake and alert, deconditioned and ill looking appearing  ENT with mild pallor with no icterus Cardiovascular with S1 and S2 present and regular with no gallops, rubs or murmurs Respiratory with poor inspiratory effort with no wheezing or rhonchi, no rales Abdomen with no distention, protuberant No ankle edema, positive lymphedema   Data Reviewed:    Family Communication: no family at the bedside   Disposition: Status is: Inpatient Remains inpatient appropriate because: recovering respiratory failure   Planned Discharge Destination: Skilled nursing facility     Author: Albertus Alt, MD 02/16/2024 9:51 AM  For on call review [email or web address].

## 2024-02-16 NOTE — Plan of Care (Signed)
  Problem: Education: Goal: Ability to describe self-care measures that may prevent or decrease complications (Diabetes Survival Skills Education) will improve Outcome: Progressing Goal: Individualized Educational Video(s) Outcome: Progressing   Problem: Coping: Goal: Ability to adjust to condition or change in health will improve Outcome: Progressing   Problem: Fluid Volume: Goal: Ability to maintain a balanced intake and output will improve Outcome: Progressing   Problem: Metabolic: Goal: Ability to maintain appropriate glucose levels will improve Outcome: Progressing   Problem: Skin Integrity: Goal: Risk for impaired skin integrity will decrease Outcome: Progressing

## 2024-02-16 NOTE — TOC Progression Note (Addendum)
 Transition of Care [facility]) - Initial/Assessment Note    Patient Details  Name: Chelsea [name] MRN: [id number] Date of Birth: 10/10/1965  Transition of Care [facility]) CM/SW Contact:    Maya Sparrow, LCSW Phone Number: 02/16/2024, 10:39 AM  Clinical Narrative:                 CSW received consult due to possible discharge early this week and contacted admissions (K[facility]) at [facility] to inform of possible discharge date.  Facility reports that the pt does not want to return to [facility] and that the patient's sister wants the patient moved closer to [place].    CSW spoke with patient at bedside.  Patient did not report any concerns and wants to return to [facility] when medically ready. Facility notified that patient does, in fact, want to return.  CSW contacted pt's legal guardian to provide update and there was no answer.  LM requesting a returned call.  TOC will continue to follow.   Expected Discharge Plan: Skilled Nursing Facility Barriers to Discharge: Continued Medical Work up   Patient Goals and CMS Choice            Expected Discharge Plan and Services In-house Referral: Clinical Social Work     Living arrangements for the past 2 months: Skilled Nursing Facility                                      Prior Living Arrangements/Services Living arrangements for the past 2 months: Skilled Nursing Facility Lives with:: Facility Resident                   Activities of Daily Living   ADL Screening (condition at time of admission) Independently performs ADLs?: No Does the patient have a NEW difficulty with bathing/dressing/toileting/self-feeding that is expected to last >3 days?: No Does the patient have a NEW difficulty with getting in/out of bed, walking, or climbing stairs that is expected to last >3 days?: No Does the patient have a NEW difficulty with communication that is expected to last >3 days?: No Is the patient deaf or have difficulty hearing?:  No Does the patient have difficulty seeing, even when wearing glasses/contacts?: No Does the patient have difficulty concentrating, remembering, or making decisions?: No  Permission Sought/Granted                  Emotional Assessment       Orientation: : Oriented to Self, Oriented to Place, Oriented to  Time, Oriented to Situation Alcohol / Substance Use: Not Applicable Psych Involvement: No (comment)  Admission diagnosis:  Acute on chronic diastolic CHF (congestive heart failure) (HCC) [I50.33] Pneumonia of both lungs due to infectious organism, unspecified part of lung [J18.9] Acute on chronic hypoxic respiratory failure (HCC) [J96.21] Patient Active Problem List   Diagnosis Date Noted   Obesity, class 3 01/29/2024   Acute on chronic diastolic CHF (congestive heart failure) (HCC) 01/28/2024   Nocturnal hypoxemia due to obesity 11/23/2023   Atrial fibrillation with RVR (HCC) 11/09/2023   H1N1 influenza 11/07/2023   Acute on chronic hypoxic respiratory failure (HCC) 11/04/2023   Abnormal CT scan of lung 09/22/2023   History of urinary retention 09/20/2023   Type 2 diabetes mellitus with diabetic neuropathy, unspecified (HCC) 08/16/2023   Hypokalemia 06/15/2023   Anxiety 05/30/2023   Atrial flutter with rapid ventricular response (HCC) 05/29/2023   Arthritis  11/13/2022   Asthma 11/13/2022   Chronic lower back pain 11/13/2022   Complication of anesthesia 11/13/2022   Coronary artery disease 11/13/2022   (HFpEF) heart failure with preserved ejection fraction (HCC) 11/13/2022   Migraine 11/13/2022   On home oxygen  therapy 11/13/2022   Chronic iron  deficiency anemia 09/18/2022   Acute on chronic respiratory failure with hypoxia (HCC) 09/17/2022   Chronic disease anemia 08/01/2022   COPD (chronic obstructive pulmonary disease) with chronic bronchitis (HCC) 07/18/2022   Chronic respiratory failure with hypercapnia (HCC) 02/12/2022   Restrictive lung disease secondary to  obesity 01/28/2022   Limitation of activity due to disability 09/18/2021   DJD (degenerative joint disease) 07/03/2021   Pressure injury of both heels, unstageable (HCC) 07/03/2021   Venous stasis dermatitis of both lower extremities 07/02/2021   Depression 04/20/2021   History of tobacco use 04/20/2021   Irritable bowel syndrome with constipation 04/20/2021   Microalbuminuria 04/20/2021   Moderate aortic stenosis 04/20/2021   Awaiting admission to adequate facility elsewhere 03/28/2021   Restless leg syndrome 03/22/2021   Atrial flutter (HCC) 03/09/2021   Pulmonary nodule less than 6 cm determined by computed tomography of lung 02/09/2021   Nocturnal hypoxemia 06/03/2020   Hair loss 09/17/2019   Chronic pain of multiple joints 06/18/2019   Onychomycosis 06/18/2019   Hypertension associated with diabetes (HCC) 08/25/2018   Class 3 severe obesity due to excess calories with serious comorbidity and body mass index (BMI) of 60.0 to 69.9 in adult 08/25/2018   Hepatitis C    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   OSA on CPAP 02/24/2018   Hyperlipidemia associated with type 2 diabetes mellitus (HCC) 09/28/2015   Mononeuropathy of lower extremity 04/09/2011   Lymphedema 03/22/2011   Obesity hypoventilation syndrome (HCC) 01/01/2011   Acute on chronic diastolic heart failure (HCC) 11/28/2010   Osteoarthritis 11/28/2010   Hypothyroidism 11/13/2010   GERD 11/10/2010   Type 2 diabetes mellitus with hyperlipidemia (HCC) 11/10/2010   PCP:  Pcp, No Pharmacy:   [facility] - [place]leda [place], [place] - [address] [place] [address] Phone: [phone] Fax: [phone]  [facility] [address] [place] [address] Phone: [phone] Fax: [phone]     Social Drivers of Health (SDOH) Social History: SDOH Screenings   Food Insecurity: No Food Insecurity (01/31/2024)  Housing: Low Risk   (01/31/2024)  Transportation Needs: No Transportation Needs (01/31/2024)  Utilities: Not At Risk (01/31/2024)  Alcohol Screen: Low Risk  (07/05/2022)  Financial Resource Strain: Low Risk  (07/05/2022)  Physical Activity: Insufficiently Active (03/27/2021)   Received from [facility]  Social Connections: Unknown (02/04/2022)   Received from [facility], [facility]  Stress: No Stress Concern Present (09/15/2021)   Received from [facility], [facility]  Tobacco Use: Low Risk  (01/28/2024)   SDOH Interventions:     Readmission Risk Interventions    11/15/2023    4:20 PM 07/23/2022    2:19 PM 04/09/2022    4:17 PM  Readmission Risk Prevention Plan  Transportation Screening Complete Complete Complete  PCP or Specialist Appt within 3-5 Days   Complete  HRI or Home Care Consult   Complete  Social Work Consult for Recovery Care Planning/Counseling   Complete  Palliative Care Screening   Not Applicable  Medication Review Oceanographer) Complete Complete Referral to Pharmacy  PCP or Specialist appointment within 3-5 days of discharge  Complete   HRI  or Home Care Consult  Complete   SW Recovery Care/Counseling Consult  Complete   Palliative Care Screening Not Applicable Not Applicable   Skilled Nursing Facility Complete Complete

## 2024-02-16 NOTE — Plan of Care (Signed)
  Problem: Coping: Goal: Ability to adjust to condition or change in health will improve Outcome: Progressing   Problem: Fluid Volume: Goal: Ability to maintain a balanced intake and output will improve Outcome: Progressing   Problem: Health Behavior/Discharge Planning: Goal: Ability to identify and utilize available resources and services will improve Outcome: Progressing Goal: Ability to manage health-related needs will improve Outcome: Progressing   Problem: Nutritional: Goal: Maintenance of adequate nutrition will improve Outcome: Progressing   Problem: Tissue Perfusion: Goal: Adequacy of tissue perfusion will improve Outcome: Progressing   Problem: Education: Goal: Knowledge of General Education information will improve Description: Including pain rating scale, medication(s)/side effects and non-pharmacologic comfort measures Outcome: Progressing   Problem: Clinical Measurements: Goal: Ability to maintain clinical measurements within normal limits will improve Outcome: Progressing Goal: Will remain free from infection Outcome: Progressing Goal: Diagnostic test results will improve Outcome: Progressing Goal: Respiratory complications will improve Outcome: Progressing Goal: Cardiovascular complication will be avoided Outcome: Progressing   Problem: Nutrition: Goal: Adequate nutrition will be maintained Outcome: Progressing   Problem: Coping: Goal: Level of anxiety will decrease Outcome: Progressing   Problem: Pain Managment: Goal: General experience of comfort will improve and/or be controlled Outcome: Progressing

## 2024-02-17 LAB — GLUCOSE, CAPILLARY
Glucose-Capillary: 204 mg/dL — ABNORMAL HIGH (ref 70–99)
Glucose-Capillary: 234 mg/dL — ABNORMAL HIGH (ref 70–99)
Glucose-Capillary: 244 mg/dL — ABNORMAL HIGH (ref 70–99)
Glucose-Capillary: 223 mg/dL — ABNORMAL HIGH (ref 70–99)
Glucose-Capillary: 188 mg/dL — ABNORMAL HIGH (ref 70–99)

## 2024-02-17 MED ORDER — INSULIN ASPART 100 UNIT/ML IJ SOLN
7.0000 [IU] | Freq: Three times a day (TID) | INTRAMUSCULAR | Status: DC
  Administered 2024-02-17 – 2024-02-18 (×4): 7 [IU] via SUBCUTANEOUS

## 2024-02-17 NOTE — Plan of Care (Signed)
  Problem: Education: Goal: Ability to describe self-care measures that may prevent or decrease complications (Diabetes Survival Skills Education) will improve Outcome: Progressing Goal: Individualized Educational Video(s) Outcome: Progressing   Problem: Coping: Goal: Ability to adjust to condition or change in health will improve Outcome: Progressing   Problem: Health Behavior/Discharge Planning: Goal: Ability to identify and utilize available resources and services will improve Outcome: Progressing Goal: Ability to manage health-related needs will improve Outcome: Progressing   Problem: Fluid Volume: Goal: Ability to maintain a balanced intake and output will improve Outcome: Progressing

## 2024-02-17 NOTE — Progress Notes (Signed)
 RT NOTE: RT to room to place PT on bipap for the night. PT stated she still wasn't ready to go on bipap. RT told PT to call when she is ready to go on ordered bipap for the night.

## 2024-02-17 NOTE — Inpatient Diabetes Management (Signed)
 Inpatient Diabetes Program Recommendations  AACE/ADA: New Consensus Statement on Inpatient Glycemic Control (2015)  Target Ranges:  Prepandial:   less than 140 mg/dL      Peak postprandial:   less than 180 mg/dL (1-2 hours)      Critically ill patients:  140 - 180 mg/dL   Lab Results  Component Value Date   GLUCAP 234 (H) 02/17/2024   HGBA1C 8.1 (H) 01/28/2024    Review of Glycemic Control  Latest Reference Range & Units 02/16/24 06:06 02/16/24 11:56 02/16/24 16:48 02/16/24 21:19 02/17/24 06:02 02/17/24 07:54  Glucose-Capillary 70 - 99 mg/dL 191 (H) 478 (H) 295 (H) 239 (H) 204 (H) 234 (H)   Diabetes history: DM 2 Outpatient Diabetes medications: Jardiance  25 mg Daily, Lantus  20 units Daily, Mounjaro 5 mg Weekly on Wednesdays Current orders for Inpatient glycemic control:  Novolog  5 units tid meal coverage Semglee  15 units bid Novolog  0-15 units tid + hs  Glucerna tid between meals  Inpatient Diabetes Program Recommendations:    -   increase Semglee  to 18 units bid -   Increase Novolog  meal coverage to 7 units tid if eating >50% of meals  Thanks,  Eloise Hake RN, MSN, BC-ADM Inpatient Diabetes Coordinator Team Pager [phone] (8a-5p)

## 2024-02-17 NOTE — Progress Notes (Signed)
 Pt non compliant with the ordered fluid restriction throughout the day. Educated pt on her current diet order and monitoring I/O's.  Pt not interactive with the discussion and needs reinforcement

## 2024-02-17 NOTE — TOC Progression Note (Signed)
 Transition of Care [facility]) - Progression Note    Patient Details  Name: Chelsea [name] MRN: [id number] Date of Birth: 10-25-1965  Transition of Care [facility]) CM/SW Contact  Valery Gaucher, [place] Phone Number: 02/17/2024, 3:47 PM  Clinical Narrative:     Sent message to [facility] patient is expected to return tomorrow.   Liddie Reel, MSW, LCSW Clinical Social Worker    Expected Discharge Plan: Skilled Nursing Facility Barriers to Discharge: Continued Medical Work up  Expected Discharge Plan and Services In-house Referral: Clinical Social Work     Living arrangements for the past 2 months: Skilled Nursing Facility                                       Social Determinants of Health (SDOH) Interventions SDOH Screenings   Food Insecurity: No Food Insecurity (01/31/2024)  Housing: Low Risk  (01/31/2024)  Transportation Needs: No Transportation Needs (01/31/2024)  Utilities: Not At Risk (01/31/2024)  Alcohol Screen: Low Risk  (07/05/2022)  Financial Resource Strain: Low Risk  (07/05/2022)  Physical Activity: Insufficiently Active (03/27/2021)   Received from [facility], [facility]  Social Connections: Unknown (02/04/2022)   Received from [facility], [facility]  Stress: No Stress Concern Present (09/15/2021)   Received from [facility], [facility]  Tobacco Use: Low Risk  (01/28/2024)    Readmission Risk Interventions    11/15/2023    4:20 PM 07/23/2022    2:19 PM 04/09/2022    4:17 PM  Readmission Risk Prevention Plan  Transportation Screening Complete Complete Complete  PCP or Specialist Appt within 3-5 Days   Complete  HRI or Home Care Consult   Complete  Social Work Consult for Recovery Care Planning/Counseling   Complete  Palliative Care Screening   Not Applicable  Medication Review Oceanographer) Complete Complete Referral to Pharmacy  PCP or Specialist appointment within 3-5 days of discharge  Complete   HRI or Home Care  Consult  Complete   SW Recovery Care/Counseling Consult  Complete   Palliative Care Screening Not Applicable Not Applicable   Skilled Nursing Facility Complete Complete

## 2024-02-17 NOTE — Plan of Care (Signed)
  Problem: Coping: Goal: Ability to adjust to condition or change in health will improve Outcome: Progressing   Problem: Fluid Volume: Goal: Ability to maintain a balanced intake and output will improve Outcome: Progressing   Problem: Metabolic: Goal: Ability to maintain appropriate glucose levels will improve Outcome: Progressing   Problem: Skin Integrity: Goal: Risk for impaired skin integrity will decrease Outcome: Progressing   Problem: Tissue Perfusion: Goal: Adequacy of tissue perfusion will improve Outcome: Progressing   Problem: Education: Goal: Knowledge of General Education information will improve Description: Including pain rating scale, medication(s)/side effects and non-pharmacologic comfort measures Outcome: Progressing   Problem: Health Behavior/Discharge Planning: Goal: Ability to manage health-related needs will improve Outcome: Progressing   Problem: Clinical Measurements: Goal: Ability to maintain clinical measurements within normal limits will improve Outcome: Progressing Goal: Will remain free from infection Outcome: Progressing Goal: Diagnostic test results will improve Outcome: Progressing Goal: Respiratory complications will improve Outcome: Progressing Goal: Cardiovascular complication will be avoided Outcome: Progressing

## 2024-02-17 NOTE — Progress Notes (Signed)
 RT NOTE: RT spoke with PT about bipap at bedtime order. PT states she wants to wait until 11:00pm or later to go on bipap. RT will come back at later time to place PT on bipap.

## 2024-02-17 NOTE — Progress Notes (Signed)
 Progress Note   Patient: Chelsea [name] VQQ:[id number] DOB: Jan 20, 1966 DOA: 01/28/2024     20 DOS: the patient was seen and examined on 02/17/2024   Brief hospital course: Chelsea [name] was admitted to the hospital with the working diagnosis of acute on chronic hypercapnic and hypoxemic respiratory failure.   58 yo female with the past medical history of T2DM, obesity hypoventilation syndrome, hypertension, COPD and obesity class 3 who presented with dyspnea.  Reported worsening symptoms for the last 4 days. Noted recently weaning off alprazolam .  On her initial physical examination her blood pressure was 127/57, HR 73, RR 18 and 02 saturation 85%  Lungs with no wheezing, positive bilateral rales, heart with S1 and S2 present and regular, abdomen with no distention and no lower extremity edema.   Chest radiograph with cardiomegaly with bilateral hilar vascular congestion, bilateral central interstitial infiltrates, with no effusions.   EKG 70 bpm, normal axis, normal intervals qtc 474, sinus rhythm with no significant ST segment changes, negative T wave V1 to V3.   Patient placed on high doses of diuretics for volume overload.  05/02 continue to have high 02 requirements per [place] high flow.  05/03 continue diuresing, slow wean of supplemental 02  05/04 worsening oxygenation, this morning on non re-breather and high flow nasal cannula despite diuresis. CT chest with signs of aspiration pneumonia, started on antibiotic therapy.  05/05 improved work of breathing. Continue diuresis.  05/06 patient uncomfortable on Bipap, and transitioned to heated  high flow nasal cannula.  05/16 improved oxygenation, today using 5 L.min per [place]. She has been transitioned to oral loop diuretic.  05/18 stable oxygenation, possible transfer to SNF on Tuesday.   Assessment and Plan: * Acute on chronic diastolic CHF (congestive heart failure) (HCC) Echocardiogram with preserved LV systolic function with EF 60  to 65%, RV systolic function preserved, moderate valve aortic stenosis, RVSP 26,6 mmHg, LA and RA not enlarged, no pericardial effusion.   05/04 CT chest with bilateral ground glass opacities, more dense infiltrates at the bases, more right than left lower lobe. Small bilateral pleural effusions.   Urine output 1800  ml  Systolic blood pressure 100 mmHg range   05/01 metolazone  2.5 mg 05/05 metolazone  2.5 mg  Continue with spironolactone   and acetazolamide .  Continue torsemide  60 mg po bid.  Continue metoprolol  succinate.   Holding on SGLT 2 inh due to body habitus and risk of urinary tract infections.  Continue midodrine  for blood pressure support.   Continue to encourage airway clearing techniques with flutter valve and incentive spirometer.   Acute on chronic hypoxemic and hypercapnic respiratory failure. Acute cardiogenic pulmonary edema.  Obesity hypoventilation syndrome  Bilateral lower lobe pneumonia, more right than left. Possible aspiration.  Completed antibiotic therapy with ceftriaxone  and azithromycin  + systemic corticosteroids.   Her 02 requirements continue to improve and today she is on 5 to 7 L/min per  with 02 saturation 98% Target 02 saturation 92% or grater.    Continue with Bronchodilator therapy, inhaled corticosteroid. Revefenacin  [name])   Continue respiratory support with intermittent bipap for respiratory support.   Atrial flutter with rapid ventricular response (HCC) Continue sinus rhythm in the 60's range bpm.  Continue rate control with metoprolol  and amiodarone   Anticoagulation with apixaban .  Continue telemetry monitoring.   Hypokalemia AKI  Stable renal function and electrolytes with serum cr at 0,89 with K at 3,9 and serum bicarbonate at 36  Na 138  Continue diuresis, sequential nephron blockade with acetazolamide ,  furosemide  and spironolactone .  Add 20 meq Kcl daily to prevent hypokalemia.   Type 2 diabetes mellitus with  hyperlipidemia (HCC) Continue glucose cover and monitoring with insulin  sliding scale increase basal insulin  to 15 units bid Continue pre meal insulin  5 units.  Her fasting glucose today is 206 mg/dl. Uncontrolled hyperglycemia, will increase dose of sliding scale.   Continue statin therapy.   Chronic iron  deficiency anemia Cell count has been stable   Hypothyroidism Continue levothyroxine    Obesity, class 3 Calculated BMI is 53.3   Anxiety Continue with as needed alprazolam .  Continue with bupropion .  Acute on chronic back pain, continue with as needed hydrocodone .          Subjective: Patient with stable dyspnea, no chest pain, continue very weak and deconditioned   Physical Exam: Vitals:   02/17/24 0259 02/17/24 0758 02/17/24 0816 02/17/24 1148  BP: (!) 103/58 117/64  (!) 106/52  Pulse: 69 70    Resp: 20 (!) 25    Temp: 98.8 F (37.1 C) 97.8 F (36.6 C)  (!) 97.5 F (36.4 C)  TempSrc: Oral Oral  Oral  SpO2: 95% 92% 91% (!) 88%  Weight: (!) 156.5 kg     Height:       Neurology awake and alert ENT with mild pallor Cardiovascular with S1 and S2 present and regular with no gallops or rubs, positive systolic murmur at the base Respiratory with no rales or rhonchi on anterior auscultation  Abdomen with no distention  No ankle edema, positive bilateral lymphedema  Data Reviewed:    Family Communication: no family at the bedside   Disposition: Status is: Inpatient Remains inpatient appropriate because: heart failure   Planned Discharge Destination: SNF     Author: Albertus Alt, MD 02/17/2024 3:46 PM  For on call review [email or web address].

## 2024-02-18 LAB — BASIC METABOLIC PANEL WITH GFR
Anion gap: 9 (ref 5–15)
BUN: 45 mg/dL — ABNORMAL HIGH (ref 6–20)
CO2: 34 mmol/L — ABNORMAL HIGH (ref 22–32)
Calcium: 8.1 mg/dL — ABNORMAL LOW (ref 8.9–10.3)
Chloride: 95 mmol/L — ABNORMAL LOW (ref 98–111)
Creatinine, Ser: 0.88 mg/dL (ref 0.44–1.00)
GFR, Estimated: >60 mL/min (ref >60)
Glucose, Bld: 160 mg/dL — ABNORMAL HIGH (ref 70–99)
Potassium: 3.8 mmol/L (ref 3.5–5.1)
Sodium: 138 mmol/L (ref 135–145)

## 2024-02-18 LAB — GLUCOSE, CAPILLARY
Glucose-Capillary: 186 mg/dL — ABNORMAL HIGH (ref 70–99)
Glucose-Capillary: 147 mg/dL — ABNORMAL HIGH (ref 70–99)
Glucose-Capillary: 220 mg/dL — ABNORMAL HIGH (ref 70–99)

## 2024-02-18 MED ORDER — GLUCERNA SHAKE PO LIQD: 237.0000 mL | Freq: Three times a day (TID) | ORAL | 0 refills | Status: AC

## 2024-02-18 MED ORDER — ACETAZOLAMIDE ER 500 MG PO CP12: 500.0000 mg | ORAL_CAPSULE | Freq: Two times a day (BID) | ORAL | 0 refills | Status: AC

## 2024-02-18 MED ORDER — INSULIN ASPART 100 UNIT/ML IJ SOLN
0.0000 [IU] | Freq: Three times a day (TID) | INTRAMUSCULAR | Status: AC
Start: 2024-02-18 — End: ?

## 2024-02-18 MED ORDER — BISACODYL 10 MG RE SUPP: 10.0000 mg | Freq: Once | RECTAL | Status: DC

## 2024-02-18 MED ORDER — TORSEMIDE 60 MG PO TABS: 60.0000 mg | ORAL_TABLET | Freq: Two times a day (BID) | ORAL | 0 refills | Status: AC

## 2024-02-18 MED ORDER — INSULIN ASPART 100 UNIT/ML IJ SOLN: 7.0000 [IU] | Freq: Three times a day (TID) | INTRAMUSCULAR | Status: AC

## 2024-02-18 MED ORDER — HYDROCODONE-ACETAMINOPHEN 5-325 MG PO TABS: 1.0000 | ORAL_TABLET | Freq: Four times a day (QID) | ORAL | Status: AC | PRN

## 2024-02-18 MED ORDER — METOPROLOL SUCCINATE ER 25 MG PO TB24: 12.5000 mg | ORAL_TABLET | Freq: Every day | ORAL | 0 refills | Status: AC

## 2024-02-18 MED ORDER — INSULIN GLARGINE 100 UNIT/ML SOLOSTAR PEN
15.0000 [IU] | PEN_INJECTOR | Freq: Two times a day (BID) | SUBCUTANEOUS | Status: AC
Start: 2024-02-18 — End: ?

## 2024-02-18 MED ORDER — POTASSIUM CHLORIDE CRYS ER 20 MEQ PO TBCR: 20.0000 meq | EXTENDED_RELEASE_TABLET | Freq: Every day | ORAL | 0 refills | Status: AC

## 2024-02-18 MED ORDER — MIDODRINE HCL 5 MG PO TABS: 5.0000 mg | ORAL_TABLET | Freq: Three times a day (TID) | ORAL | 0 refills | Status: AC

## 2024-02-18 MED ORDER — ALPRAZOLAM 0.25 MG PO TABS: 0.2500 mg | ORAL_TABLET | Freq: Three times a day (TID) | ORAL | Status: AC | PRN

## 2024-02-18 MED ORDER — OXYCODONE-ACETAMINOPHEN 10-325 MG PO TABS: 1.0000 | ORAL_TABLET | Freq: Four times a day (QID) | ORAL | Status: DC | PRN

## 2024-02-18 NOTE — Discharge Summary (Addendum)
 Physician Discharge Summary   Patient: Chelsea [name] MRN: [id number] DOB: 11/06/1965  Admit date:     01/28/2024  Discharge date: 02/18/24  Discharge Physician: Curlee Doss Diesel Lina   PCP: Pcp, No   Recommendations at discharge:    Patient will continue diuresis with acetazolamide , torsemide , spironolactone  Continue close monitoring of renal function and electrolytes.  Continue wean off supplemental 02 per [place] to baseline of 5L/min to target 02 saturation 92% or greater.   Discharge Diagnoses: Principal Problem:   Acute on chronic diastolic CHF (congestive heart failure) (HCC) Active Problems:   Atrial flutter with rapid ventricular response (HCC)   Hypokalemia   Type 2 diabetes mellitus with hyperlipidemia (HCC)   Chronic iron  deficiency anemia   Hypothyroidism   Obesity, class 3   Anxiety  Resolved Problems:   Acute on chronic diastolic CHF (congestive heart failure) [facility])  Hospital Course: Mrs. Pinkham was admitted to the hospital with the working diagnosis of acute on chronic hypercapnic and hypoxemic respiratory failure.   58 yo female with the past medical history of T2DM, obesity hypoventilation syndrome, hypertension, COPD and obesity class 3 who presented with dyspnea.  Reported worsening symptoms for the last 4 days. Noted recently weaning off alprazolam .  On her initial physical examination her blood pressure was 127/57, HR 73, RR 18 and 02 saturation 85%  Lungs with no wheezing, positive bilateral rales, heart with S1 and S2 present and regular, abdomen with no distention and no lower extremity edema.   VBG 7,41/73.3/ 46/ 46.7/ 79%  Na 137 K 3,2 Cl 83 bicarbonate 40 glucose 232 bun 40 cr 1,12 AST 12 ALT 13  BNP 284  High sensitive troponin 6 and 7  Wbc 8,3 hgb 10.3 plt 278  Sars COVID 19 negative  RSV negative  Influenza negative Respiratory viral panel negative   Urine analysis SG 1,006, protein negative, leukocytes and hgb negative   Chest  radiograph with cardiomegaly with bilateral hilar vascular congestion, bilateral central interstitial infiltrates, with no effusions.   EKG 70 bpm, normal axis, normal intervals qtc 474, sinus rhythm with no significant ST segment changes, negative T wave V1 to V3.   Patient placed on high doses of diuretics for volume overload.  05/02 continue to have high 02 requirements per [place] high flow.  05/03 continue diuresing, slow wean of supplemental 02  05/04 worsening oxygenation, this morning on non re-breather and high flow nasal cannula despite diuresis. CT chest with signs of aspiration pneumonia, started on antibiotic therapy.  05/05 improved work of breathing. Continue diuresis.  05/06 patient uncomfortable on Bipap, and transitioned to heated  high flow nasal cannula.  05/16 improved oxygenation, today using 5 L.min per [place]. She has been transitioned to oral loop diuretic.  05/18 stable oxygenation, possible transfer to SNF on Tuesday.  05/20 continue to have 02 requirements more than her baseline 5 L/min, using up to 11 L/min.  She will be transferred to [facility] to continue wean off 02 and continue physical therapy.   Assessment and Plan: * Acute on chronic diastolic CHF (congestive heart failure) (HCC) Echocardiogram with preserved LV systolic function with EF 60 to 65%, RV systolic function preserved, moderate valve aortic stenosis, RVSP 26,6 mmHg, LA and RA not enlarged, no pericardial effusion.   05/04 CT chest with bilateral ground glass opacities, more dense infiltrates at the bases, more right than left lower lobe. Small bilateral pleural effusions.   Patient was placed on aggressive diuretic therapy with IV furosmide, metolazone , spironolactone ,  and acetazolamide , negative fluid balance was achieved, - 29,362 ml, with significant improvement in her symptoms.   Plan to continue acetazolamide , spironolactone  and  torsemide  for diuresis.  Continue metoprolol  succinate.   Holding on SGLT  2 inh due to body habitus and risk of urinary tract infections.  Continue midodrine  for blood pressure support.   Continue to encourage airway clearing techniques with flutter valve and incentive spirometer.   Acute on chronic hypoxemic and hypercapnic respiratory failure. Acute cardiogenic pulmonary edema.  Obesity hypoventilation syndrome  Bilateral lower lobe pneumonia, more right than left. Possible aspiration.  Completed antibiotic therapy with ceftriaxone  and azithromycin  + systemic corticosteroids.   Her 02 requirements continue to improve but not yet back to baseline, today she is on 11 L/min per [place] with 02 saturation of 98%.   Continue with Bronchodilator therapy, inhaled corticosteroid. Revefenacin  [name])   Continue respiratory support with intermittent and at bedtime bipap for respiratory support.  Patient will need further respiratory pulmonary rehab in order to improve further oxygenation.  She will be transferred to [facility]   Atrial flutter with rapid ventricular response (HCC) Continue sinus rhythm Continue rate control with metoprolol  and amiodarone   Anticoagulation with apixaban .   Hypokalemia AKI  Renal function today with serum cr at 0,8 with K at 3,8 and serum bicarbonate at 34  Na 138   Continue diuresis, sequential nephron blockade with acetazolamide , furosemide  and spironolactone .   20 meq Kcl daily to prevent hypokalemia.   Type 2 diabetes mellitus with hyperlipidemia (HCC) Continue glucose cover and monitoring with insulin  sliding scale increase basal insulin  to 15 units bid Continue pre meal insulin  7 units.   Uncontrolled hyperglycemia, will increase dose of sliding scale.   Continue statin therapy.   Chronic iron  deficiency anemia Cell count has been stable   Hypothyroidism Continue levothyroxine    Obesity, class 3 Calculated BMI is 53.3   Anxiety Continue with as needed alprazolam .  Continue with bupropion .  Acute on chronic back  pain, continue with as needed hydrocodone .         Consultants: pulmonary  Procedures performed: none   Disposition: LTAC Diet recommendation:  Cardiac diet DISCHARGE MEDICATION: Allergies as of 02/18/2024       Reactions   Iodinated Contrast Media Anaphylaxis   Penicillins Anaphylaxis   Patient tolerates cefepime  (08/2017)   Insulin  Lispro Other (See Comments)   Adder per MAR from SNF   Minoxidil Hives        Medication List     STOP taking these medications    busPIRone  10 MG tablet Commonly known as: BUSPAR    empagliflozin  25 MG Tabs tablet Commonly known as: JARDIANCE    furosemide  10 MG/ML injection Commonly known as: LASIX    metolazone  2.5 MG tablet Commonly known as: ZAROXOLYN    Mounjaro 2.5 MG/0.5ML Pen Generic drug: tirzepatide   oxyCODONE -acetaminophen  10-325 MG tablet Commonly known as: PERCOCET   Potassium Chloride  ER 20 MEQ Tbcr       TAKE these medications    acetaZOLAMIDE  ER 500 MG capsule Commonly known as: DIAMOX  Take 1 capsule (500 mg total) by mouth every 12 (twelve) hours.   ALPRAZolam  0.25 MG tablet Commonly known as: XANAX  Take 1 tablet (0.25 mg total) by mouth 3 (three) times daily as needed for anxiety. What changed:  medication strength how much to take when to take this reasons to take this   amiodarone  200 MG tablet Commonly known as: PACERONE  Take 1 tablet (200 mg total) by mouth daily.  atorvastatin  20 MG tablet Commonly known as: LIPITOR Take 20 mg by mouth every evening.   buPROPion  ER 100 MG 12 hr tablet Commonly known as: WELLBUTRIN  SR Take 1 tablet (100 mg total) by mouth 2 (two) times daily.   Cholecalciferol  125 MCG (5000 UT) Tabs Take 1 tablet (5,000 Units total) by mouth daily. What changed: when to take this   Eliquis  5 MG Tabs tablet Generic drug: apixaban  Take 5 mg by mouth 2 (two) times daily.   feeding supplement (GLUCERNA SHAKE) Liqd Take 237 mLs by mouth 3 (three) times daily between  meals.   HYDROcodone -acetaminophen  5-325 MG tablet Commonly known as: NORCO/VICODIN Take 1 tablet by mouth every 6 (six) hours as needed for moderate pain (pain score 4-6).   insulin  aspart 100 UNIT/ML injection Commonly known as: novoLOG  Inject 0-15 Units into the skin 3 (three) times daily with meals.   insulin  aspart 100 UNIT/ML injection Commonly known as: novoLOG  Inject 7 Units into the skin 3 (three) times daily with meals.   insulin  glargine 100 UNIT/ML Solostar Pen Commonly known as: LANTUS  Inject 15 Units into the skin 2 (two) times daily. What changed: when to take this   ipratropium-albuterol  0.5-2.5 (3) MG/3ML Soln Commonly known as: DUONEB Take 3 mLs by nebulization 3 (three) times daily. For 3 days for dyspnea   levothyroxine  150 MCG tablet Commonly known as: SYNTHROID  Take 1 tablet (150 mcg total) by mouth daily at 6 (six) AM. What changed: when to take this   metoprolol  succinate 25 MG 24 hr tablet Commonly known as: TOPROL -XL Take 0.5 tablets (12.5 mg total) by mouth daily. Start taking on: Feb 19, 2024 What changed:  medication strength how much to take additional instructions   midodrine  5 MG tablet Commonly known as: PROAMATINE  Take 1 tablet (5 mg total) by mouth 3 (three) times daily with meals.   montelukast  10 MG tablet Commonly known as: SINGULAIR  Take 10 mg by mouth at bedtime.   NON FORMULARY Apply BIPAP/CPAP every night at bedtime.   OXYGEN  Inhale 5 L/min into the lungs at bedtime. Connect to BIPAP/CPAP every night   potassium chloride  SA 20 MEQ tablet Commonly known as: KLOR-CON  M Take 1 tablet (20 mEq total) by mouth daily. Start taking on: Feb 19, 2024   rOPINIRole  0.5 MG tablet Commonly known as: REQUIP  Take 1 tablet (0.5 mg total) by mouth at bedtime.   spironolactone  25 MG tablet Commonly known as: ALDACTONE  Take 25 mg by mouth daily.   Torsemide  60 MG Tabs Take 60 mg by mouth 2 (two) times daily. What changed:   medication strength how much to take   Trelegy Ellipta  100-62.5-25 MCG/ACT Aepb Generic drug: Fluticasone -Umeclidin-Vilant Inhale 1 puff into the lungs daily.   Voltaren  1 % Gel Generic drug: diclofenac  Sodium Apply 2-4 g topically in the morning and at bedtime. Apply to bilateral knees/L shoulder topically twice a day for arthritis 4g dose lower extremities, 2g dose upper extremities        Discharge Exam: Filed Weights   02/16/24 0331 02/17/24 0259 02/18/24 0337  Weight: (!) 154.2 kg (!) 156.5 kg (!) 157.1 kg   BP 113/68 (BP Location: Right Wrist)   Pulse 69   Temp 98.1 F (36.7 C) (Oral)   Resp (!) 24   Ht 5\' 4"  (1.626 m)   Wt (!) 157.1 kg   SpO2 98%   BMI 59.45 kg/m    Patient with improvement of dyspnea but not yet back to base line,  her back pain is controlled with analgesics and she is tolerating po well.   Neurology awake and alert, deconditioned ENT with mild pallor Cardiovascular with S1 and S2 present and regular with no gallops or rubs, positive systolic murmur at the base No JVD (wide short neck) Respiratory with no rales or wheezing, no rhonchi on anterior auscultation  Abdomen with no distention, protuberant but non tender No ankle edema, positive lymphedema   Condition at discharge: stable  The results of significant diagnostics from this hospitalization (including imaging, microbiology, ancillary and laboratory) are listed below for reference.   Imaging Studies: CT CHEST WO CONTRAST Result Date: 02/02/2024 CLINICAL DATA:  Pneumonia complication. EXAM: CT CHEST WITHOUT CONTRAST TECHNIQUE: Multidetector CT imaging of the chest was performed following the standard protocol without IV contrast. RADIATION DOSE REDUCTION: This exam was performed according to the departmental dose-optimization program which includes automated exposure control, adjustment of the mA and/or kV according to patient size and/or use of iterative reconstruction technique.  COMPARISON:  Chest CT 09/19/2023. FINDINGS: Cardiovascular: The heart is enlarged. Aorta is normal in size. There are atherosclerotic calcifications of the aorta. There is no pericardial effusion. Mediastinum/Nodes: There are nonenlarged and enlarged paratracheal lymph nodes measuring up to 16 mm, unchanged. Nonenlarged subcarinal and prevascular lymph nodes of are again noted. Difficult to assess for hilar adenopathy on this study secondary to lack of contrast. Visualized esophagus and thyroid gland are within normal limits. Lungs/Pleura: There are small bilateral pleural effusions. Multifocal airspace consolidation is seen in the bilateral lower lobes and minimally in the bilateral upper lobes. Consolidation is most confluent in the inferior right lower lobe. Additionally there are multifocal ground-glass opacities throughout the bilateral upper lobes, right greater than left. No pneumothorax. Trachea and central airways are patent. Upper Abdomen: No acute abnormality. Cholecystectomy clips are present. Musculoskeletal: No chest wall mass or suspicious bone lesions identified. IMPRESSION: 1. Multifocal airspace consolidation in the bilateral lower lobes and minimally in the bilateral upper lobes, most confluent in the inferior right lower lobe. Findings are compatible with multifocal pneumonia. 2. Multifocal ground-glass opacities throughout the bilateral upper lobes, right greater than left, worrisome for multifocal pneumonia. 3. Small bilateral pleural effusions. 4. Stable mediastinal adenopathy, likely reactive. 5. Cardiomegaly. 6. Aortic atherosclerosis. Aortic Atherosclerosis (ICD10-I70.0). Electronically Signed   By: Tyron Gallon M.D.   On: 02/02/2024 19:45   DG Chest Port 1 View Result Date: 02/02/2024 CLINICAL DATA:  Acute respiratory distress. EXAM: PORTABLE CHEST 1 VIEW COMPARISON:  Chest radiograph dated 01/31/2024. FINDINGS: There is cardiomegaly with worsening CHF. Pneumonia is not excluded. No  pneumothorax. No acute osseous pathology. IMPRESSION: Cardiomegaly with worsening CHF. Electronically Signed   By: Angus Bark M.D.   On: 02/02/2024 13:40   DG Chest 1 View Result Date: 01/31/2024 CLINICAL DATA:  Shortness of breath. EXAM: CHEST  1 VIEW COMPARISON:  January 28, 2024. FINDINGS: Mild cardiomegaly is again noted with central pulmonary vascular congestion. Probable bilateral pulmonary edema is noted. Small pleural effusions may be present. Bony thorax is unremarkable. IMPRESSION: Mild cardiomegaly with central pulmonary vascular congestion and probable bilateral pulmonary edema. Electronically Signed   By: Rosalene Colon M.D.   On: 01/31/2024 13:15   ECHOCARDIOGRAM COMPLETE Result Date: 01/29/2024    ECHOCARDIOGRAM REPORT   Patient Name:   CHRISTIN MOLINE Date of Exam: 01/29/2024 Medical Rec #:  [id number]            Height:       64.0 in Accession #:  [id number]           Weight:       311.0 lb Date of Birth:  [birth year]            BSA:          2.360 m Patient Age:    58 years             BP:           95/50 mmHg Patient Gender: F                    HR:           72 bpm. Exam Location:  Inpatient Procedure: 2D Echo, Cardiac Doppler and Color Doppler (Both Spectral and Color            Flow Doppler were utilized during procedure). Indications:    CHF- Acute Diastolic I50.31  History:        Patient has prior history of Echocardiogram examinations, most                 recent 11/11/2023. CHF, CAD, Migraine and COPD, Arrythmias:Atrial                 Fibrillation; Risk Factors:Sleep Apnea, Dyslipidemia, Diabetes                 and Current Smoker.  Sonographer:    Terrilee Few RCS Referring Phys: [id number] ANASTASSIA DOUTOVA  Sonographer Comments: Patient is obese. Image acquisition challenging due to respiratory motion. IMPRESSIONS  1. Left ventricular ejection fraction, by estimation, is 60 to 65%. The left ventricle has normal function. The left ventricle has no regional wall motion  abnormalities. Left ventricular diastolic parameters were normal.  2. Right ventricular systolic function is normal. The right ventricular size is normal. There is normal pulmonary artery systolic pressure.  3. The mitral valve is normal in structure. Trivial mitral valve regurgitation. No evidence of mitral stenosis.  4. The aortic valve is tricuspid. There is moderate calcification of the aortic valve. There is moderate thickening of the aortic valve. Aortic valve regurgitation is not visualized. Moderate aortic valve stenosis. Aortic valve area, by VTI measures 0.74 cm. Aortic valve mean gradient measures 21.0 mmHg. Aortic valve Vmax measures 3.18 m/s.  5. The inferior vena cava is dilated in size with <50% respiratory variability, suggesting right atrial pressure of 15 mmHg. FINDINGS  Left Ventricle: Left ventricular ejection fraction, by estimation, is 60 to 65%. The left ventricle has normal function. The left ventricle has no regional wall motion abnormalities. The left ventricular internal cavity size was normal in size. There is  no left ventricular hypertrophy. Left ventricular diastolic parameters were normal. Indeterminate filling pressures. Right Ventricle: The right ventricular size is normal. No increase in right ventricular wall thickness. Right ventricular systolic function is normal. There is normal pulmonary artery systolic pressure. The tricuspid regurgitant velocity is 1.70 m/s, and  with an assumed right atrial pressure of 15 mmHg, the estimated right ventricular systolic pressure is 26.6 mmHg. Left Atrium: Left atrial size was normal in size. Right Atrium: Right atrial size was normal in size. Pericardium: There is no evidence of pericardial effusion. Mitral Valve: The mitral valve is normal in structure. Trivial mitral valve regurgitation. No evidence of mitral valve stenosis. Tricuspid Valve: The tricuspid valve is normal in structure. Tricuspid valve regurgitation is trivial. No evidence  of tricuspid stenosis. Aortic Valve: The aortic valve is tricuspid. There is moderate calcification of  the aortic valve. There is moderate thickening of the aortic valve. Aortic valve regurgitation is not visualized. Moderate aortic stenosis is present. Aortic valve mean gradient measures 21.0 mmHg. Aortic valve peak gradient measures 40.4 mmHg. Aortic valve area, by VTI measures 0.74 cm. Pulmonic Valve: The pulmonic valve was normal in structure. Pulmonic valve regurgitation is not visualized. No evidence of pulmonic stenosis. Aorta: The aortic root is normal in size and structure. Venous: The inferior vena cava is dilated in size with less than 50% respiratory variability, suggesting right atrial pressure of 15 mmHg. IAS/Shunts: No atrial level shunt detected by color flow Doppler.  LEFT VENTRICLE PLAX 2D LVIDd:         4.60 cm   Diastology LVIDs:         3.10 cm   LV e' medial:    9.68 cm/s LV PW:         1.30 cm   LV E/e' medial:  11.9 LV IVS:        1.00 cm   LV e' lateral:   7.29 cm/s LVOT diam:     1.70 cm   LV E/e' lateral: 15.8 LV SV:         48 LV SV Index:   20 LVOT Area:     2.27 cm  RIGHT VENTRICLE             IVC RV S prime:     11.40 cm/s  IVC diam: 3.10 cm TAPSE (M-mode): 2.5 cm LEFT ATRIUM             Index        RIGHT ATRIUM           Index LA diam:        4.80 cm 2.03 cm/m   RA Area:     21.25 cm LA Vol (A2C):   61.4 ml 26.02 ml/m  RA Volume:   59.75 ml  25.32 ml/m LA Vol (A4C):   51.4 ml 21.78 ml/m LA Biplane Vol: 61.7 ml 26.14 ml/m  AORTIC VALVE AV Area (Vmax):    0.69 cm AV Area (Vmean):   0.73 cm AV Area (VTI):     0.74 cm AV Vmax:           318.00 cm/s AV Vmean:          198.000 cm/s AV VTI:            0.646 m AV Peak Grad:      40.4 mmHg AV Mean Grad:      21.0 mmHg LVOT Vmax:         96.08 cm/s LVOT Vmean:        63.775 cm/s LVOT VTI:          0.211 m LVOT/AV VTI ratio: 0.33  AORTA Ao Root diam: 2.90 cm Ao Asc diam:  3.70 cm MITRAL VALVE                TRICUSPID VALVE MV Area  (PHT): 4.39 cm     TR Peak grad:   11.6 mmHg MV Decel Time: 173 msec     TR Vmax:        170.00 cm/s MR Peak grad: 70.6 mmHg MR Vmax:      420.00 cm/s   SHUNTS MV E velocity: 115.00 cm/s  Systemic VTI:  0.21 m MV A velocity: 90.10 cm/s   Systemic Diam: 1.70 cm MV E/A ratio:  1.28 Maudine Sos MD Electronically signed by Jyl Or  Theodis Fiscal MD Signature Date/Time: 01/29/2024/6:31:56 PM    Final    DG Chest Portable 1 View Result Date: 01/28/2024 CLINICAL DATA:  Shortness of breath. EXAM: PORTABLE CHEST 1 VIEW COMPARISON:  Chest radiograph dated 11/22/2023. FINDINGS: Cardiomegaly with vascular congestion and edema. Similar appearance of bilateral interstitial opacities which may represent edema or pneumonia. No large pleural effusion. No pneumothorax. No acute osseous pathology. IMPRESSION: Cardiomegaly with vascular congestion and edema. Pneumonia is not excluded. Overall no significant interval change. Electronically Signed   By: Angus Bark M.D.   On: 01/28/2024 16:37    Microbiology: Results for orders placed or performed during the hospital encounter of 01/28/24  Resp panel by RT-PCR (RSV, Flu A&B, Covid) Anterior Nasal Swab     Status: None   Collection Time: 01/28/24  8:29 PM   Specimen: Anterior Nasal Swab  Result Value Ref Range Status   SARS Coronavirus 2 by RT PCR NEGATIVE NEGATIVE Final   Influenza A by PCR NEGATIVE NEGATIVE Final   Influenza B by PCR NEGATIVE NEGATIVE Final    Comment: (NOTE) The Xpert Xpress SARS-CoV-2/FLU/RSV plus assay is intended as an aid in the diagnosis of influenza from Nasopharyngeal swab specimens and should not be used as a sole basis for treatment. Nasal washings and aspirates are unacceptable for Xpert Xpress SARS-CoV-2/FLU/RSV testing.  Fact Sheet for Patients: BloggerCourse.com  Fact Sheet for Healthcare Providers: SeriousBroker.it  This test is not yet approved or cleared by the [place] FDA and has been authorized for detection and/or diagnosis of SARS-CoV-2 by FDA under an Emergency Use Authorization (EUA). This EUA will remain in effect (meaning this test can be used) for the duration of the COVID-19 declaration under Section 564(b)(1) of the Act, 21 U.S.C. section 360bbb-3(b)(1), unless the authorization is terminated or revoked.     Resp Syncytial Virus by PCR NEGATIVE NEGATIVE Final    Comment: (NOTE) Fact Sheet for Patients: BloggerCourse.com  Fact Sheet for Healthcare Providers: SeriousBroker.it  This test is not yet approved or cleared by the [place]  FDA and has been authorized for detection and/or diagnosis of SARS-CoV-2 by FDA under an Emergency Use Authorization (EUA). This EUA will remain in effect (meaning this test can be used) for the duration of the COVID-19 declaration under Section 564(b)(1) of the Act, 21 U.S.C. section 360bbb-3(b)(1), unless the authorization is terminated or revoked.  Performed at [facility], [address] [place], [place] [address]   Respiratory (~20 pathogens) panel by PCR     Status: None   Collection Time: 02/02/24  3:41 PM   Specimen: Nasopharyngeal Swab; Respiratory  Result Value Ref Range Status   Adenovirus NOT DETECTED NOT DETECTED Final   Coronavirus 229E NOT DETECTED NOT DETECTED Final    Comment: (NOTE) The Coronavirus on the Respiratory Panel, DOES NOT test for the novel  Coronavirus (2019 nCoV)    Coronavirus HKU1 NOT DETECTED NOT DETECTED Final   Coronavirus NL63 NOT DETECTED NOT DETECTED Final   Coronavirus OC43 NOT DETECTED NOT DETECTED Final   Metapneumovirus NOT DETECTED NOT DETECTED Final   Rhinovirus / Enterovirus NOT DETECTED NOT DETECTED Final   Influenza A NOT DETECTED NOT DETECTED Final   Influenza B NOT DETECTED NOT DETECTED Final   Parainfluenza Virus 1 NOT DETECTED NOT DETECTED Final   Parainfluenza Virus 2 NOT  DETECTED NOT DETECTED Final   Parainfluenza Virus 3 NOT DETECTED NOT DETECTED Final   Parainfluenza Virus 4 NOT DETECTED NOT DETECTED Final   Respiratory  Syncytial Virus NOT DETECTED NOT DETECTED Final   Bordetella pertussis NOT DETECTED NOT DETECTED Final   Bordetella Parapertussis NOT DETECTED NOT DETECTED Final   Chlamydophila pneumoniae NOT DETECTED NOT DETECTED Final   Mycoplasma pneumoniae NOT DETECTED NOT DETECTED Final    Comment: Performed at [facility], [address] [place], [place] [address]    Labs: CBC: No results for input(s): "WBC", "NEUTROABS", "HGB", "HCT", "MCV", "PLT" in the last 168 hours. Basic Metabolic Panel: Recent Labs  Lab 02/12/24 0337 02/14/24 0556 02/15/24 0301 02/16/24 1323 02/18/24 0948  NA 138 139 137 138 138  K 3.7 3.7 3.7 3.9 3.8  CL 96* 98 96* 93* 95*  CO2 33* 33* 31 36* 34*  GLUCOSE 154* 232* 220* 206* 160*  BUN 47* 46* 47* 45* 45*  CREATININE 0.87 1.01* 0.82 0.89 0.88  CALCIUM  8.2* 8.0* 7.9* 8.3* 8.1*  MG  --  2.4 2.4  --   --    Liver Function Tests: No results for input(s): "AST", "ALT", "ALKPHOS", "BILITOT", "PROT", "ALBUMIN " in the last 168 hours. CBG: Recent Labs  Lab 02/17/24 1153 02/17/24 1553 02/17/24 2136 02/18/24 0617 02/18/24 1059  GLUCAP 244* 223* 188* 186* 147*    Discharge time spent: greater than 30 minutes.  Signed: Albertus Alt, MD [facility] 02/18/2024

## 2024-02-18 NOTE — Progress Notes (Signed)
 Patient does not have a legal guardian but in order to print out AVS had to state they had been notified.  Genella Kendall, RN

## 2024-02-18 NOTE — TOC Transition Note (Signed)
 Transition of Care [facility]) - Discharge Note Sherin Dingwall RN, BSN Transitions of Care Unit 4E- RN Case Manager See Treatment Team for direct phone #   Patient Details  Name: Chelsea [name] MRN: [id number] Date of Birth: [birth year]  Transition of Care [facility]) CM/SW Contact:  Rox Cope, RN Phone Number: 02/18/2024, 4:00 PM   Clinical Narrative:    Pt needing increased 02 liter flow this am- 11L- unable to transition back to SNF level of care. CM received msg from MD to have pt screens by LTACHs (per MD he has spoken with pt and she is agreeable to look at [facility])- CM reached out to both [facility] and [facility] to have pt screened for eligibility.   1155- Received msg from [facility] that they can offer bed today.  CM spoke with pt at bedside and offered choice for LTACH bed- Per pt she has been to [facility] in past and would like to go to [facility] again- pt wants to accept bed offer by [facility] for transition today to [facility] level of care.  MD updated. [facility] liaison notified   1320- Per [facility] liaison- Pt has bed with accepting MD- Dr. Leia Pun-  Room 405 MD handoff # for Dr. Leia Pun - [phone]  # for RN report- [phone]  1500- [facility] called for transport, per dispatch ETA for pickup 1-2 hrs or more. Paperwork along with GOLD DNR placed on chart. Bedside RN updated as well as pt and SO at bedside.   No further TOC needs noted- pt to transition to [facility]     Final next level of care: Long Term Acute Care (LTAC) Barriers to Discharge: Barriers Resolved   Patient Goals and CMS Choice Patient states their goals for this hospitalization and ongoing recovery are:: to get better CMS Medicare.gov Compare Post Acute Care list provided to:: Patient Choice offered to / list presented to : Patient      Discharge Placement               [facility]        Discharge Plan and Services Additional resources added to the After Visit Summary for    In-house Referral: Clinical Social Work Discharge Planning Services: CM Consult Post Acute Care Choice: Long Term Acute Care (LTAC)          DME Arranged: N/A DME Agency: NA       HH Arranged: NA HH Agency: NA        Social Drivers of Health (SDOH) Interventions SDOH Screenings   Food Insecurity: No Food Insecurity (01/31/2024)  Housing: Low Risk  (01/31/2024)  Transportation Needs: No Transportation Needs (01/31/2024)  Utilities: Not At Risk (01/31/2024)  Alcohol Screen: Low Risk  (07/05/2022)  Financial Resource Strain: Low Risk  (07/05/2022)  Physical Activity: Insufficiently Active (03/27/2021)   Received from [facility], [facility]  Social Connections: Unknown (02/04/2022)   Received from [facility], [facility]  Stress: No Stress Concern Present (09/15/2021)   Received from [facility], [facility]  Tobacco Use: Low Risk  (01/28/2024)     Readmission Risk Interventions    02/18/2024    4:00 PM 11/15/2023    4:20 PM 07/23/2022    2:19 PM  Readmission Risk Prevention Plan  Transportation Screening Complete Complete Complete  Medication Review Oceanographer) Complete Complete Complete  PCP or Specialist appointment within 3-5 days of discharge   Complete  HRI or Home Care Consult Complete  Complete  SW Recovery Care/Counseling Consult Complete  Complete  Palliative Care Screening Not Applicable Not Applicable Not Applicable  Skilled Nursing Facility Not Applicable Complete Complete

## 2024-02-18 NOTE — Care Management Important Message (Signed)
 Important Message  Patient Details  Name: Chelsea [name] MRN: [id number] Date of Birth: 1965/10/15   Important Message Given:  Yes - Medicare IM     Felix Host 02/18/2024, 5:04 PM

## 2024-02-18 NOTE — Progress Notes (Signed)
 Nutrition Brief Note  Patient remains in-patient for treatment of HF.  TOC working on placement. Noted plans to return to [facility] in the coming days.   Chart reviewed.  Patient's PO intake appears much improved. Of documented meals, pt is consuming 100%. She is also receiving and consuming nutrition supplements.   No new nutrition interventions warranted at this time.  Recommend continuing with current nutrition plan of care.   RD to sign off at this time.  Please re-consult if additional nutrition related concerns should arise.   Chelsea [name], RDN, LDN Clinical Nutrition See AMi[facility] for contact information.

## 2024-02-19 DIAGNOSIS — R079 Chest pain, unspecified: Secondary | ICD-10-CM

## 2024-02-24 DIAGNOSIS — J9621 Acute and chronic respiratory failure with hypoxia: Secondary | ICD-10-CM

## 2024-02-24 DIAGNOSIS — I480 Paroxysmal atrial fibrillation: Secondary | ICD-10-CM

## 2024-02-24 DIAGNOSIS — J441 Chronic obstructive pulmonary disease with (acute) exacerbation: Secondary | ICD-10-CM

## 2024-02-25 DIAGNOSIS — J441 Chronic obstructive pulmonary disease with (acute) exacerbation: Secondary | ICD-10-CM

## 2024-02-25 DIAGNOSIS — I480 Paroxysmal atrial fibrillation: Secondary | ICD-10-CM

## 2024-02-25 DIAGNOSIS — J9621 Acute and chronic respiratory failure with hypoxia: Secondary | ICD-10-CM

## 2024-02-25 DIAGNOSIS — T884XXA Failed or difficult intubation, initial encounter: Secondary | ICD-10-CM

## 2024-02-26 DIAGNOSIS — I480 Paroxysmal atrial fibrillation: Secondary | ICD-10-CM

## 2024-02-26 DIAGNOSIS — J441 Chronic obstructive pulmonary disease with (acute) exacerbation: Secondary | ICD-10-CM

## 2024-02-26 DIAGNOSIS — J9621 Acute and chronic respiratory failure with hypoxia: Secondary | ICD-10-CM

## 2024-02-26 DIAGNOSIS — T884XXA Failed or difficult intubation, initial encounter: Secondary | ICD-10-CM

## 2024-02-27 DIAGNOSIS — J441 Chronic obstructive pulmonary disease with (acute) exacerbation: Secondary | ICD-10-CM

## 2024-02-27 DIAGNOSIS — J9621 Acute and chronic respiratory failure with hypoxia: Secondary | ICD-10-CM

## 2024-02-27 DIAGNOSIS — I480 Paroxysmal atrial fibrillation: Secondary | ICD-10-CM

## 2024-02-28 DIAGNOSIS — I480 Paroxysmal atrial fibrillation: Secondary | ICD-10-CM

## 2024-02-28 DIAGNOSIS — J9621 Acute and chronic respiratory failure with hypoxia: Secondary | ICD-10-CM

## 2024-02-28 DIAGNOSIS — J441 Chronic obstructive pulmonary disease with (acute) exacerbation: Secondary | ICD-10-CM

## 2024-02-29 DIAGNOSIS — I480 Paroxysmal atrial fibrillation: Secondary | ICD-10-CM

## 2024-02-29 DIAGNOSIS — J9621 Acute and chronic respiratory failure with hypoxia: Secondary | ICD-10-CM

## 2024-02-29 DIAGNOSIS — J441 Chronic obstructive pulmonary disease with (acute) exacerbation: Secondary | ICD-10-CM

## 2024-03-01 DIAGNOSIS — I480 Paroxysmal atrial fibrillation: Secondary | ICD-10-CM

## 2024-03-01 DIAGNOSIS — J441 Chronic obstructive pulmonary disease with (acute) exacerbation: Secondary | ICD-10-CM

## 2024-03-01 DIAGNOSIS — J9621 Acute and chronic respiratory failure with hypoxia: Secondary | ICD-10-CM

## 2024-03-31 DEATH — deceased
